# Patient Record
Sex: Female | Born: 1940 | Race: Black or African American | Hispanic: No | Marital: Single | Smoking: Current every day smoker
Health system: Southern US, Community
[De-identification: ages and names within clinical notes are randomized; demographics above are authoritative.]

## PROBLEM LIST (undated history)

## (undated) DIAGNOSIS — R011 Cardiac murmur, unspecified: Secondary | ICD-10-CM

## (undated) DIAGNOSIS — F419 Anxiety disorder, unspecified: Secondary | ICD-10-CM

## (undated) DIAGNOSIS — IMO0002 Reserved for concepts with insufficient information to code with codable children: Secondary | ICD-10-CM

## (undated) DIAGNOSIS — N134 Hydroureter: Secondary | ICD-10-CM

## (undated) DIAGNOSIS — I679 Cerebrovascular disease, unspecified: Secondary | ICD-10-CM

## (undated) DIAGNOSIS — J189 Pneumonia, unspecified organism: Secondary | ICD-10-CM

## (undated) DIAGNOSIS — Z87442 Personal history of urinary calculi: Secondary | ICD-10-CM

## (undated) DIAGNOSIS — S21339A Puncture wound without foreign body of unspecified front wall of thorax with penetration into thoracic cavity, initial encounter: Secondary | ICD-10-CM

## (undated) DIAGNOSIS — H269 Unspecified cataract: Secondary | ICD-10-CM

## (undated) DIAGNOSIS — D373 Neoplasm of uncertain behavior of appendix: Secondary | ICD-10-CM

## (undated) DIAGNOSIS — R911 Solitary pulmonary nodule: Secondary | ICD-10-CM

## (undated) DIAGNOSIS — D649 Anemia, unspecified: Secondary | ICD-10-CM

## (undated) DIAGNOSIS — M25562 Pain in left knee: Secondary | ICD-10-CM

## (undated) DIAGNOSIS — K2270 Barrett's esophagus without dysplasia: Secondary | ICD-10-CM

## (undated) DIAGNOSIS — M545 Low back pain, unspecified: Secondary | ICD-10-CM

## (undated) DIAGNOSIS — R110 Nausea: Secondary | ICD-10-CM

## (undated) DIAGNOSIS — K219 Gastro-esophageal reflux disease without esophagitis: Secondary | ICD-10-CM

## (undated) DIAGNOSIS — C7A8 Other malignant neuroendocrine tumors: Secondary | ICD-10-CM

## (undated) DIAGNOSIS — M25561 Pain in right knee: Secondary | ICD-10-CM

## (undated) DIAGNOSIS — F1010 Alcohol abuse, uncomplicated: Secondary | ICD-10-CM

## (undated) DIAGNOSIS — R140 Abdominal distension (gaseous): Secondary | ICD-10-CM

## (undated) DIAGNOSIS — F32A Depression, unspecified: Secondary | ICD-10-CM

## (undated) DIAGNOSIS — K589 Irritable bowel syndrome without diarrhea: Secondary | ICD-10-CM

## (undated) DIAGNOSIS — K311 Adult hypertrophic pyloric stenosis: Secondary | ICD-10-CM

## (undated) DIAGNOSIS — K838 Other specified diseases of biliary tract: Secondary | ICD-10-CM

## (undated) DIAGNOSIS — I10 Essential (primary) hypertension: Secondary | ICD-10-CM

## (undated) DIAGNOSIS — M1990 Unspecified osteoarthritis, unspecified site: Secondary | ICD-10-CM

## (undated) DIAGNOSIS — W3400XA Accidental discharge from unspecified firearms or gun, initial encounter: Secondary | ICD-10-CM

## (undated) DIAGNOSIS — E538 Deficiency of other specified B group vitamins: Secondary | ICD-10-CM

## (undated) DIAGNOSIS — Z8719 Personal history of other diseases of the digestive system: Secondary | ICD-10-CM

## (undated) DIAGNOSIS — N1330 Unspecified hydronephrosis: Secondary | ICD-10-CM

## (undated) DIAGNOSIS — G44209 Tension-type headache, unspecified, not intractable: Secondary | ICD-10-CM

## (undated) DIAGNOSIS — G250 Essential tremor: Secondary | ICD-10-CM

## (undated) DIAGNOSIS — H409 Unspecified glaucoma: Secondary | ICD-10-CM

## (undated) DIAGNOSIS — R633 Feeding difficulties: Secondary | ICD-10-CM

## (undated) DIAGNOSIS — E860 Dehydration: Secondary | ICD-10-CM

## (undated) DIAGNOSIS — F329 Major depressive disorder, single episode, unspecified: Secondary | ICD-10-CM

## (undated) DIAGNOSIS — Z8679 Personal history of other diseases of the circulatory system: Secondary | ICD-10-CM

## (undated) DIAGNOSIS — R6339 Other feeding difficulties: Secondary | ICD-10-CM

## (undated) DIAGNOSIS — K859 Acute pancreatitis without necrosis or infection, unspecified: Secondary | ICD-10-CM

## (undated) DIAGNOSIS — R319 Hematuria, unspecified: Secondary | ICD-10-CM

## (undated) DIAGNOSIS — I82409 Acute embolism and thrombosis of unspecified deep veins of unspecified lower extremity: Secondary | ICD-10-CM

## (undated) DIAGNOSIS — E872 Acidosis: Secondary | ICD-10-CM

## (undated) DIAGNOSIS — D689 Coagulation defect, unspecified: Secondary | ICD-10-CM

## (undated) DIAGNOSIS — I639 Cerebral infarction, unspecified: Secondary | ICD-10-CM

## (undated) DIAGNOSIS — G4730 Sleep apnea, unspecified: Secondary | ICD-10-CM

## (undated) DIAGNOSIS — I4510 Unspecified right bundle-branch block: Secondary | ICD-10-CM

## (undated) DIAGNOSIS — K5790 Diverticulosis of intestine, part unspecified, without perforation or abscess without bleeding: Secondary | ICD-10-CM

## (undated) DIAGNOSIS — T7840XA Allergy, unspecified, initial encounter: Secondary | ICD-10-CM

## (undated) DIAGNOSIS — K3184 Gastroparesis: Secondary | ICD-10-CM

## (undated) DIAGNOSIS — K648 Other hemorrhoids: Secondary | ICD-10-CM

## (undated) DIAGNOSIS — I700 Atherosclerosis of aorta: Secondary | ICD-10-CM

## (undated) DIAGNOSIS — E8729 Other acidosis: Secondary | ICD-10-CM

## (undated) HISTORY — DX: Anemia, unspecified: D64.9

## (undated) HISTORY — DX: Solitary pulmonary nodule: R91.1

## (undated) HISTORY — PX: ESOPHAGOGASTRODUODENOSCOPY: SHX1529

## (undated) HISTORY — PX: OTHER SURGICAL HISTORY: SHX169

## (undated) HISTORY — DX: Coagulation defect, unspecified: D68.9

## (undated) HISTORY — PX: APPENDECTOMY: SHX54

## (undated) HISTORY — DX: Diverticulosis of intestine, part unspecified, without perforation or abscess without bleeding: K57.90

## (undated) HISTORY — DX: Reserved for concepts with insufficient information to code with codable children: IMO0002

## (undated) HISTORY — DX: Other specified diseases of biliary tract: K83.8

## (undated) HISTORY — DX: Other feeding difficulties: R63.39

## (undated) HISTORY — DX: Essential tremor: G25.0

## (undated) HISTORY — DX: Major depressive disorder, single episode, unspecified: F32.9

## (undated) HISTORY — DX: Cerebrovascular disease, unspecified: I67.9

## (undated) HISTORY — PX: HERNIA REPAIR: SHX51

## (undated) HISTORY — DX: Gastroparesis: K31.84

## (undated) HISTORY — DX: Sleep apnea, unspecified: G47.30

## (undated) HISTORY — DX: Adult hypertrophic pyloric stenosis: K31.1

## (undated) HISTORY — DX: Gastro-esophageal reflux disease without esophagitis: K21.9

## (undated) HISTORY — DX: Feeding difficulties: R63.3

## (undated) HISTORY — PX: COLONOSCOPY: SHX174

## (undated) HISTORY — DX: Acute embolism and thrombosis of unspecified deep veins of unspecified lower extremity: I82.409

## (undated) HISTORY — PX: THORACOTOMY: SUR1349

## (undated) HISTORY — DX: Unspecified glaucoma: H40.9

## (undated) HISTORY — DX: Barrett's esophagus without dysplasia: K22.70

## (undated) HISTORY — DX: Irritable bowel syndrome, unspecified: K58.9

## (undated) HISTORY — DX: Unspecified osteoarthritis, unspecified site: M19.90

## (undated) HISTORY — DX: Unspecified cataract: H26.9

## (undated) HISTORY — DX: Neoplasm of uncertain behavior of appendix: D37.3

## (undated) HISTORY — DX: Puncture wound without foreign body of unspecified front wall of thorax with penetration into thoracic cavity, initial encounter: S21.339A

## (undated) HISTORY — DX: Accidental discharge from unspecified firearms or gun, initial encounter: W34.00XA

## (undated) HISTORY — DX: Acute pancreatitis without necrosis or infection, unspecified: K85.90

## (undated) HISTORY — DX: Depression, unspecified: F32.A

## (undated) HISTORY — DX: Essential (primary) hypertension: I10

## (undated) HISTORY — DX: Other hemorrhoids: K64.8

## (undated) HISTORY — DX: Allergy, unspecified, initial encounter: T78.40XA

---

## 1997-08-21 ENCOUNTER — Encounter: Admission: RE | Admit: 1997-08-21 | Discharge: 1997-08-21 | Payer: Self-pay | Admitting: Family Medicine

## 1997-08-30 ENCOUNTER — Encounter: Admission: RE | Admit: 1997-08-30 | Discharge: 1997-08-30 | Payer: Self-pay | Admitting: Sports Medicine

## 1997-10-11 ENCOUNTER — Encounter: Admission: RE | Admit: 1997-10-11 | Discharge: 1997-10-11 | Payer: Self-pay | Admitting: Family Medicine

## 1997-10-18 ENCOUNTER — Encounter: Admission: RE | Admit: 1997-10-18 | Discharge: 1997-10-18 | Payer: Self-pay | Admitting: Sports Medicine

## 1997-11-12 ENCOUNTER — Encounter: Admission: RE | Admit: 1997-11-12 | Discharge: 1997-11-12 | Payer: Self-pay | Admitting: Family Medicine

## 1998-02-08 ENCOUNTER — Encounter: Admission: RE | Admit: 1998-02-08 | Discharge: 1998-02-08 | Payer: Self-pay | Admitting: Family Medicine

## 1998-05-31 ENCOUNTER — Encounter: Admission: RE | Admit: 1998-05-31 | Discharge: 1998-05-31 | Payer: Self-pay | Admitting: Family Medicine

## 1998-06-14 ENCOUNTER — Encounter: Admission: RE | Admit: 1998-06-14 | Discharge: 1998-06-14 | Payer: Self-pay | Admitting: Family Medicine

## 1998-07-17 ENCOUNTER — Encounter: Admission: RE | Admit: 1998-07-17 | Discharge: 1998-07-17 | Payer: Self-pay | Admitting: Family Medicine

## 1998-08-19 ENCOUNTER — Encounter: Admission: RE | Admit: 1998-08-19 | Discharge: 1998-08-19 | Payer: Self-pay | Admitting: Sports Medicine

## 1998-08-20 ENCOUNTER — Encounter: Admission: RE | Admit: 1998-08-20 | Discharge: 1998-08-20 | Payer: Self-pay | Admitting: Family Medicine

## 1998-08-21 ENCOUNTER — Encounter: Admission: RE | Admit: 1998-08-21 | Discharge: 1998-08-21 | Payer: Self-pay | Admitting: Family Medicine

## 1998-12-31 ENCOUNTER — Encounter: Admission: RE | Admit: 1998-12-31 | Discharge: 1998-12-31 | Payer: Self-pay | Admitting: Sports Medicine

## 1999-01-15 ENCOUNTER — Encounter: Admission: RE | Admit: 1999-01-15 | Discharge: 1999-01-15 | Payer: Self-pay | Admitting: Family Medicine

## 1999-01-21 ENCOUNTER — Encounter: Admission: RE | Admit: 1999-01-21 | Discharge: 1999-01-21 | Payer: Self-pay | Admitting: Sports Medicine

## 1999-02-19 ENCOUNTER — Encounter: Admission: RE | Admit: 1999-02-19 | Discharge: 1999-02-19 | Payer: Self-pay | Admitting: Family Medicine

## 1999-02-28 ENCOUNTER — Encounter: Admission: RE | Admit: 1999-02-28 | Discharge: 1999-02-28 | Payer: Self-pay | Admitting: Sports Medicine

## 1999-03-19 ENCOUNTER — Encounter: Admission: RE | Admit: 1999-03-19 | Discharge: 1999-03-19 | Payer: Self-pay | Admitting: Family Medicine

## 2000-03-07 ENCOUNTER — Ambulatory Visit (HOSPITAL_COMMUNITY): Admission: RE | Admit: 2000-03-07 | Discharge: 2000-03-07 | Payer: Self-pay

## 2001-03-11 ENCOUNTER — Encounter: Payer: Self-pay | Admitting: Family Medicine

## 2001-03-11 ENCOUNTER — Encounter: Admission: RE | Admit: 2001-03-11 | Discharge: 2001-03-11 | Payer: Self-pay | Admitting: Family Medicine

## 2001-08-31 ENCOUNTER — Encounter: Payer: Self-pay | Admitting: Internal Medicine

## 2001-08-31 ENCOUNTER — Ambulatory Visit (HOSPITAL_COMMUNITY): Admission: RE | Admit: 2001-08-31 | Discharge: 2001-08-31 | Payer: Self-pay | Admitting: Internal Medicine

## 2002-08-20 ENCOUNTER — Emergency Department (HOSPITAL_COMMUNITY): Admission: AD | Admit: 2002-08-20 | Discharge: 2002-08-20 | Payer: Self-pay | Admitting: Emergency Medicine

## 2002-08-20 ENCOUNTER — Encounter: Payer: Self-pay | Admitting: Emergency Medicine

## 2003-04-04 ENCOUNTER — Emergency Department (HOSPITAL_COMMUNITY): Admission: EM | Admit: 2003-04-04 | Discharge: 2003-04-05 | Payer: Self-pay | Admitting: Emergency Medicine

## 2003-04-25 ENCOUNTER — Ambulatory Visit (HOSPITAL_COMMUNITY): Admission: RE | Admit: 2003-04-25 | Discharge: 2003-04-25 | Payer: Self-pay | Admitting: Internal Medicine

## 2004-01-21 ENCOUNTER — Ambulatory Visit: Payer: Self-pay | Admitting: *Deleted

## 2004-01-22 ENCOUNTER — Ambulatory Visit: Payer: Self-pay | Admitting: Nurse Practitioner

## 2004-01-29 ENCOUNTER — Ambulatory Visit: Payer: Self-pay | Admitting: *Deleted

## 2004-02-11 ENCOUNTER — Ambulatory Visit: Payer: Self-pay | Admitting: Nurse Practitioner

## 2004-03-03 ENCOUNTER — Ambulatory Visit: Payer: Self-pay | Admitting: Nurse Practitioner

## 2004-04-07 ENCOUNTER — Ambulatory Visit: Payer: Self-pay | Admitting: Nurse Practitioner

## 2004-04-23 ENCOUNTER — Ambulatory Visit: Payer: Self-pay | Admitting: Nurse Practitioner

## 2004-06-09 ENCOUNTER — Ambulatory Visit: Payer: Self-pay | Admitting: Nurse Practitioner

## 2004-06-30 ENCOUNTER — Ambulatory Visit: Payer: Self-pay | Admitting: Nurse Practitioner

## 2004-07-03 ENCOUNTER — Ambulatory Visit (HOSPITAL_COMMUNITY): Admission: RE | Admit: 2004-07-03 | Discharge: 2004-07-03 | Payer: Self-pay | Admitting: Internal Medicine

## 2004-07-15 ENCOUNTER — Ambulatory Visit (HOSPITAL_COMMUNITY): Admission: RE | Admit: 2004-07-15 | Discharge: 2004-07-15 | Payer: Self-pay | Admitting: Internal Medicine

## 2004-07-16 ENCOUNTER — Ambulatory Visit (HOSPITAL_COMMUNITY): Admission: RE | Admit: 2004-07-16 | Discharge: 2004-07-16 | Payer: Self-pay | Admitting: Internal Medicine

## 2004-07-16 ENCOUNTER — Ambulatory Visit: Payer: Self-pay | Admitting: Cardiology

## 2004-07-16 ENCOUNTER — Encounter: Payer: Self-pay | Admitting: Cardiology

## 2004-07-30 ENCOUNTER — Ambulatory Visit (HOSPITAL_COMMUNITY): Admission: RE | Admit: 2004-07-30 | Discharge: 2004-07-30 | Payer: Self-pay | Admitting: Cardiology

## 2004-08-02 HISTORY — PX: CARDIAC CATHETERIZATION: SHX172

## 2004-08-13 ENCOUNTER — Ambulatory Visit (HOSPITAL_COMMUNITY): Admission: RE | Admit: 2004-08-13 | Discharge: 2004-08-13 | Payer: Self-pay | Admitting: Cardiology

## 2004-09-04 ENCOUNTER — Ambulatory Visit: Payer: Self-pay | Admitting: Nurse Practitioner

## 2004-09-08 ENCOUNTER — Ambulatory Visit: Payer: Self-pay | Admitting: Nurse Practitioner

## 2004-09-10 ENCOUNTER — Ambulatory Visit (HOSPITAL_COMMUNITY): Admission: RE | Admit: 2004-09-10 | Discharge: 2004-09-10 | Payer: Self-pay | Admitting: Internal Medicine

## 2004-09-14 IMAGING — RF DG UGI W/ HIGH DENSITY W/KUB
15 of 18 series · 15 of 18 positions shown · non-contrast
Comparison: none

CLINICAL DATA: Nausea, vomiting.  Prior Nissen fundoplication.  
 UPPER GI, KUB
 KUB
 A preliminary film of the abdomen shows a nonspecific bowel gas pattern.  Again there is a question of a left upper pole renal calculus.  Moderate amount of feces is noted throughout the colon. 
 UPPER GI
 The esophagus distends normally.  There is narrowing at the GE junction secondary to the Nissen fundoplication.  However, in the LPO position there is mild gastroesophageal reflux noted.  The stomach is normal in contour and peristalsis.  The duodenal bulb fills well with no ulceration.  
 IMPRESSION
 There is gastroesophageal reflux noted.  No active ulceration is seen. 
 [REDACTED]

[Series 1: run · 1 of 1 slices shown (1 of 15)]
[im 1/1]
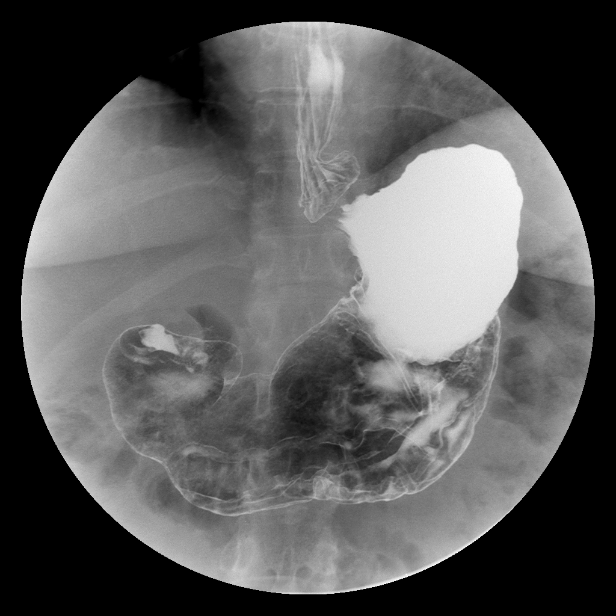

[Series 2: run · 1 of 1 slices shown (2 of 15)]
[im 1/1]
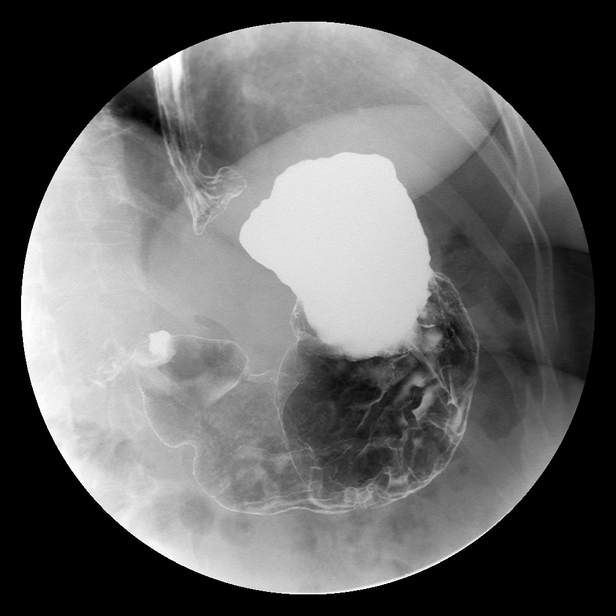

[Series 4: run · 1 of 1 slices shown (3 of 15)]
[im 1/1]
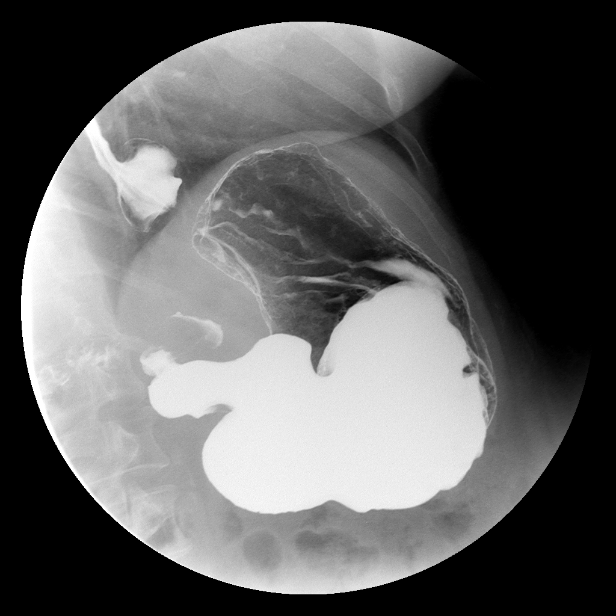

[Series 7: run · 1 of 1 slices shown (4 of 15)]
[im 1/1]
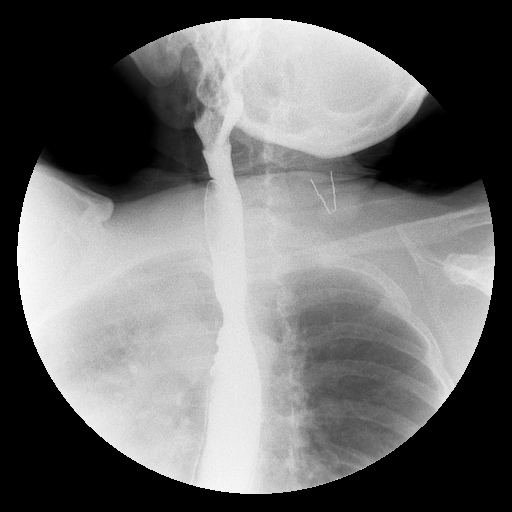

[Series 8: run · 1 of 1 slices shown (5 of 15)]
[im 1/1]
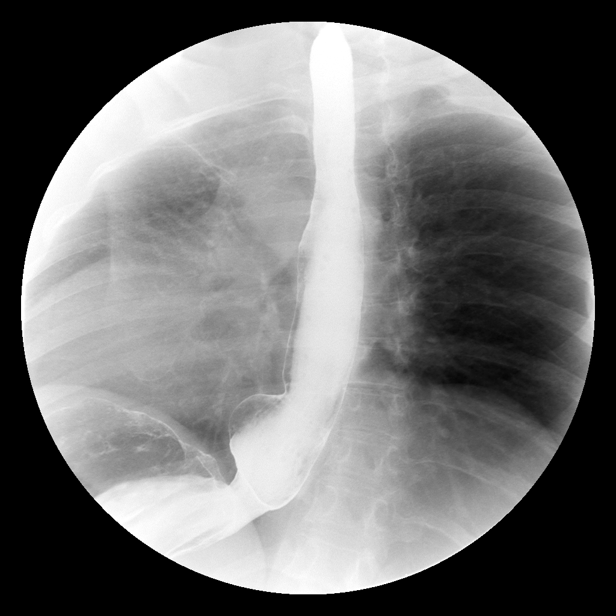

[Series 9: run · 1 of 1 slices shown (6 of 15)]
[im 1/1]
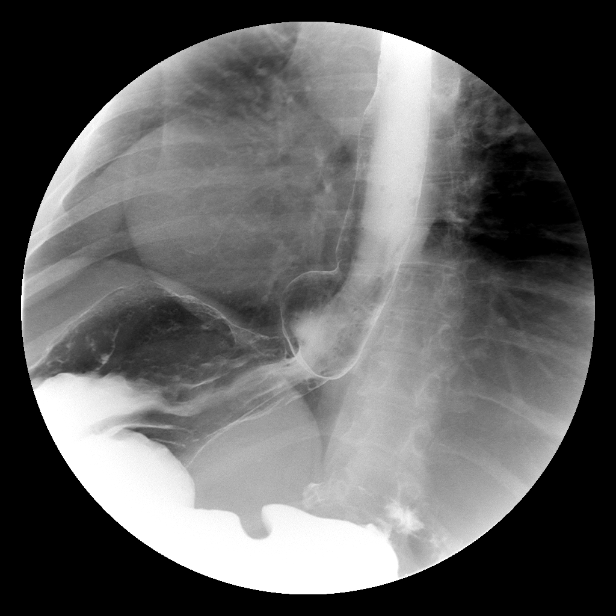

[Series 12: run · 1 of 1 slices shown (7 of 15)]
[im 1/1]
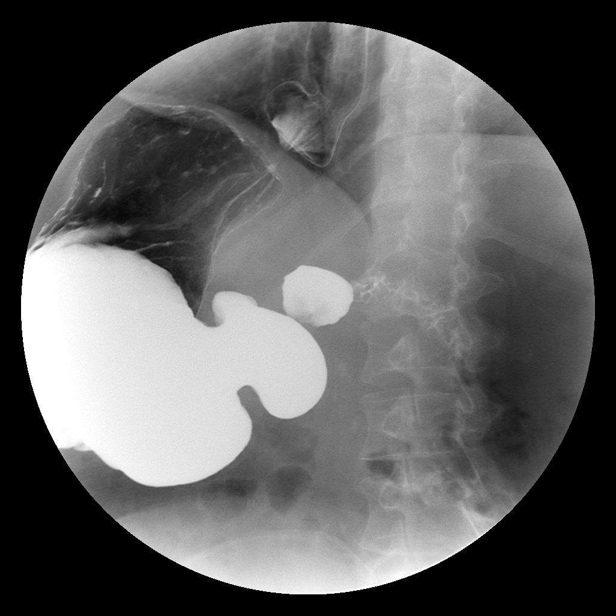

[Series 14: run · 1 of 1 slices shown (8 of 15)]
[im 1/1]
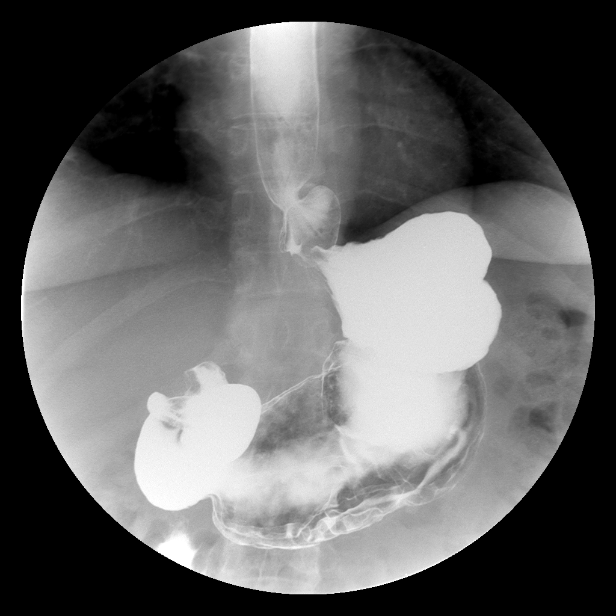

[Series 16: run · 1 of 1 slices shown (9 of 15)]
[im 1/1]
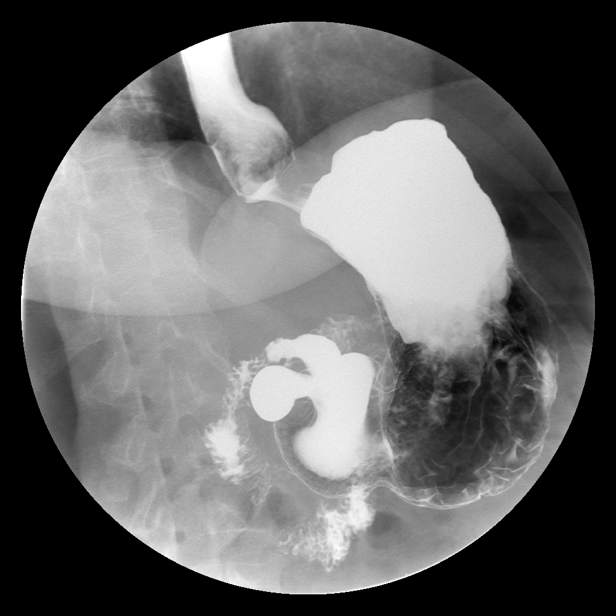

[Series 17: run · 1 of 1 slices shown (10 of 15)]
[im 1/1]
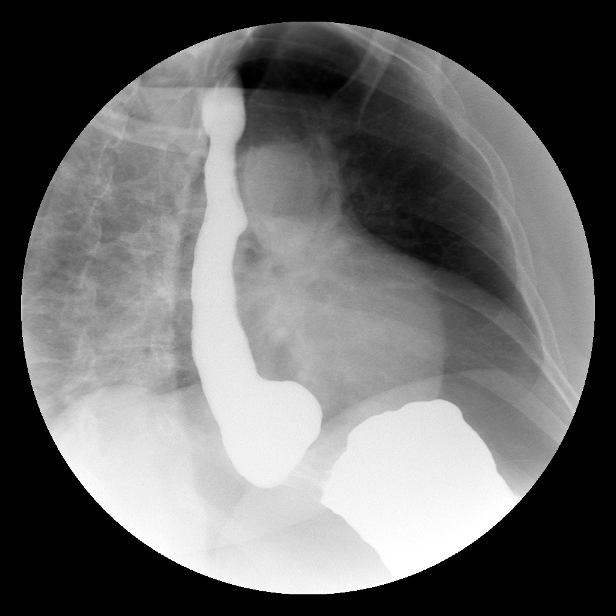

[Series 20: run · 1 of 1 slices shown (11 of 15)]
[im 1/1]
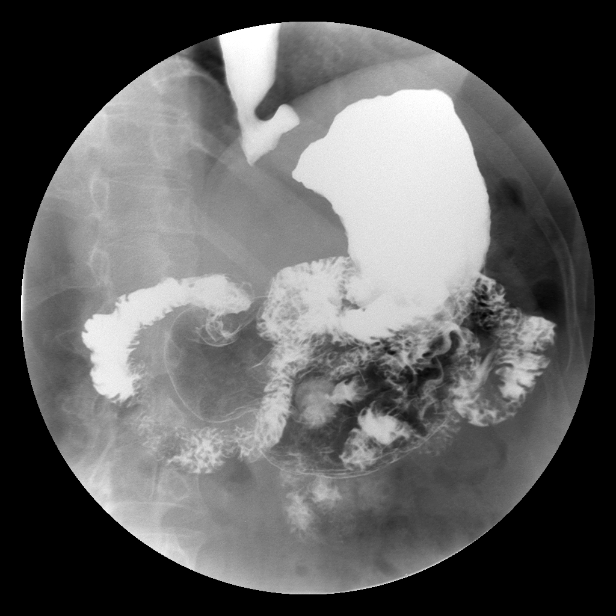

[Series 21: run · 1 of 1 slices shown (12 of 15)]
[im 1/1]
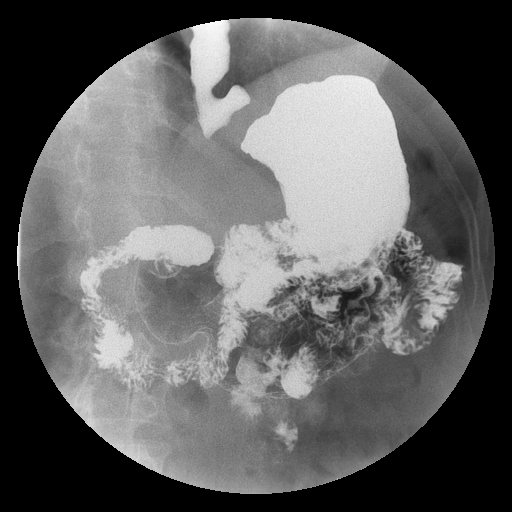

[Series 24: run · 1 of 1 slices shown (13 of 15)]
[im 1/1]
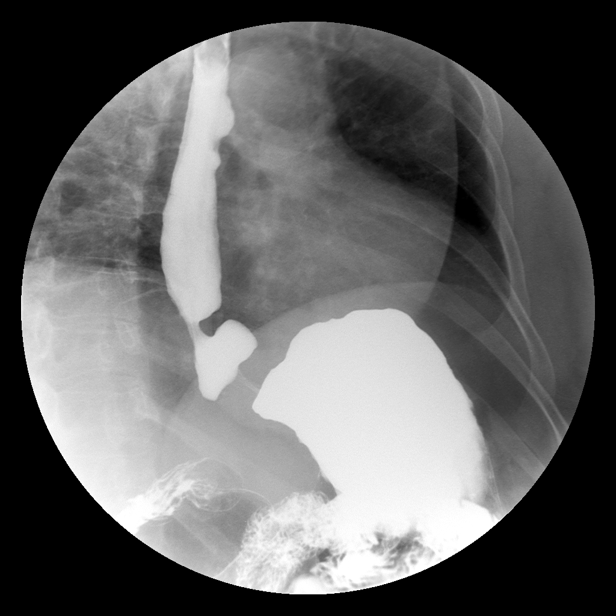

[Series 25: run · 1 of 1 slices shown (14 of 15)]
[im 1/1]
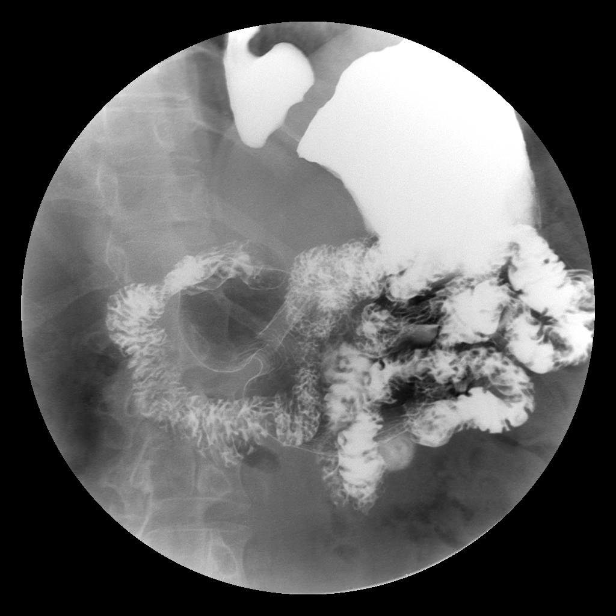

[Series 26: run · 1 of 1 slices shown (15 of 15)]
[im 1/1]
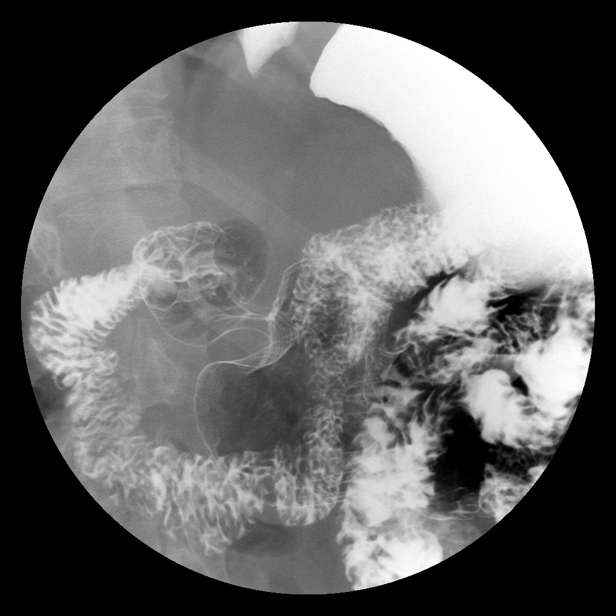

[15 of 18 positions shown; findings below may reference images not displayed]

## 2004-10-08 ENCOUNTER — Ambulatory Visit (HOSPITAL_COMMUNITY): Admission: RE | Admit: 2004-10-08 | Discharge: 2004-10-08 | Payer: Self-pay | Admitting: Internal Medicine

## 2004-10-22 ENCOUNTER — Ambulatory Visit: Payer: Self-pay | Admitting: Nurse Practitioner

## 2004-11-06 ENCOUNTER — Encounter: Admission: RE | Admit: 2004-11-06 | Discharge: 2004-11-06 | Payer: Self-pay | Admitting: Internal Medicine

## 2004-11-07 ENCOUNTER — Ambulatory Visit: Payer: Self-pay | Admitting: Nurse Practitioner

## 2005-02-09 ENCOUNTER — Ambulatory Visit: Payer: Self-pay | Admitting: Nurse Practitioner

## 2005-03-31 ENCOUNTER — Ambulatory Visit: Payer: Self-pay | Admitting: Nurse Practitioner

## 2005-04-02 ENCOUNTER — Ambulatory Visit (HOSPITAL_COMMUNITY): Admission: RE | Admit: 2005-04-02 | Discharge: 2005-04-02 | Payer: Self-pay | Admitting: Internal Medicine

## 2005-04-07 ENCOUNTER — Ambulatory Visit: Payer: Self-pay | Admitting: Nurse Practitioner

## 2005-04-08 ENCOUNTER — Ambulatory Visit (HOSPITAL_COMMUNITY): Admission: RE | Admit: 2005-04-08 | Discharge: 2005-04-08 | Payer: Self-pay | Admitting: Internal Medicine

## 2005-06-24 ENCOUNTER — Ambulatory Visit: Payer: Self-pay | Admitting: Nurse Practitioner

## 2005-07-28 ENCOUNTER — Ambulatory Visit: Payer: Self-pay | Admitting: Family Medicine

## 2005-08-09 ENCOUNTER — Ambulatory Visit: Payer: Self-pay | Admitting: Internal Medicine

## 2005-08-09 ENCOUNTER — Inpatient Hospital Stay (HOSPITAL_COMMUNITY): Admission: EM | Admit: 2005-08-09 | Discharge: 2005-08-10 | Payer: Self-pay | Admitting: Emergency Medicine

## 2005-08-10 ENCOUNTER — Encounter: Payer: Self-pay | Admitting: Internal Medicine

## 2005-08-10 ENCOUNTER — Ambulatory Visit: Payer: Self-pay | Admitting: Internal Medicine

## 2005-08-13 ENCOUNTER — Encounter: Admission: RE | Admit: 2005-08-13 | Discharge: 2005-09-02 | Payer: Self-pay | Admitting: Internal Medicine

## 2005-08-14 ENCOUNTER — Ambulatory Visit: Admission: RE | Admit: 2005-08-14 | Discharge: 2005-08-14 | Payer: Self-pay | Admitting: Critical Care Medicine

## 2005-08-17 ENCOUNTER — Ambulatory Visit: Payer: Self-pay | Admitting: Nurse Practitioner

## 2005-08-24 ENCOUNTER — Ambulatory Visit (HOSPITAL_BASED_OUTPATIENT_CLINIC_OR_DEPARTMENT_OTHER): Admission: RE | Admit: 2005-08-24 | Discharge: 2005-08-24 | Payer: Self-pay | Admitting: Internal Medicine

## 2005-08-30 ENCOUNTER — Ambulatory Visit: Payer: Self-pay | Admitting: Internal Medicine

## 2005-09-09 ENCOUNTER — Ambulatory Visit: Payer: Self-pay | Admitting: Nurse Practitioner

## 2005-09-14 ENCOUNTER — Ambulatory Visit: Payer: Self-pay | Admitting: Internal Medicine

## 2005-09-15 ENCOUNTER — Ambulatory Visit: Payer: Self-pay | Admitting: Internal Medicine

## 2005-09-17 ENCOUNTER — Ambulatory Visit: Payer: Self-pay | Admitting: Internal Medicine

## 2005-09-17 ENCOUNTER — Encounter (INDEPENDENT_AMBULATORY_CARE_PROVIDER_SITE_OTHER): Payer: Self-pay | Admitting: *Deleted

## 2005-09-21 ENCOUNTER — Ambulatory Visit (HOSPITAL_COMMUNITY): Admission: RE | Admit: 2005-09-21 | Discharge: 2005-09-21 | Payer: Self-pay | Admitting: Nurse Practitioner

## 2005-10-22 ENCOUNTER — Ambulatory Visit: Payer: Self-pay | Admitting: Internal Medicine

## 2005-11-23 IMAGING — CT CT PELVIS W/ CM
3 of 5 series · 11 of 36 positions shown, 17 images · IV contrast (GASTRO & 120 ML OMNI)
Comparison: None.

CLINICAL DATA: Persistent shortness of breath.  Chest pressure.  History of   hiatal hernia.  Constipation, diarrhea.
TECHNIQUE: 120 cc Omnipaque 300 was utilized.

[Series 2: chest/abd/pelvis · axial · 0.83mm/px · z∈[-495,-122]mm · 5 of 113 slices shown, 10 images (1 of 2)]
[im 19/113  soft-tissue]
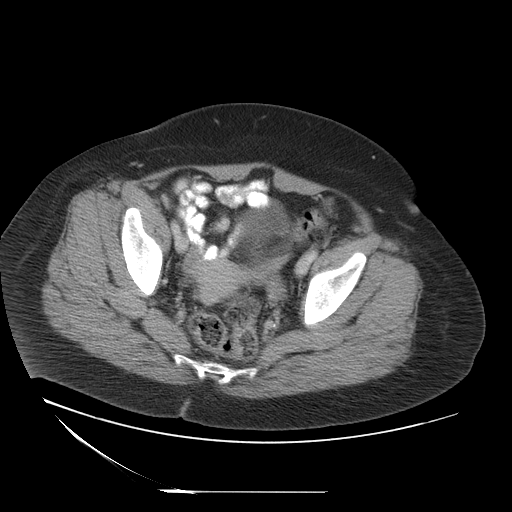
[im 19/113  bone]
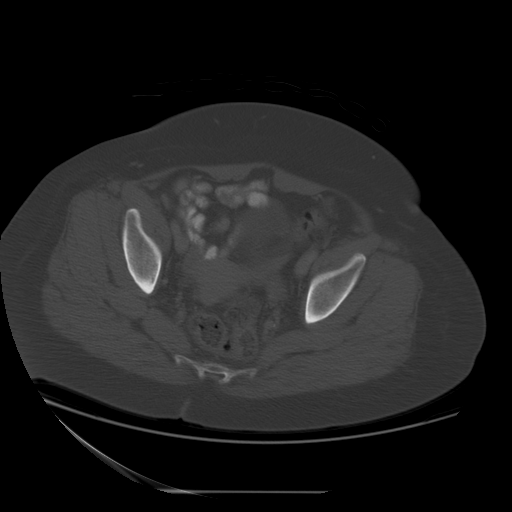
[im 38/113  soft-tissue]
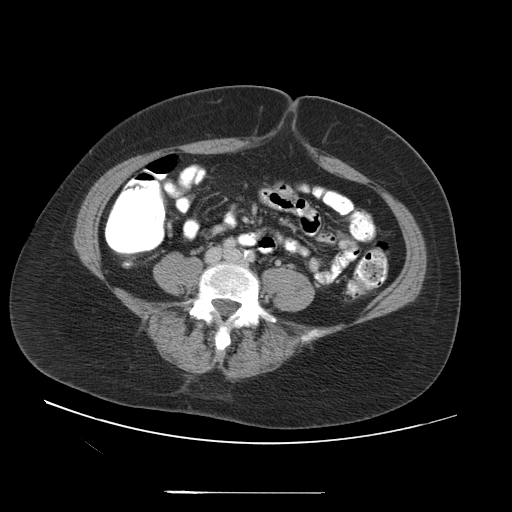
[im 38/113  lung]
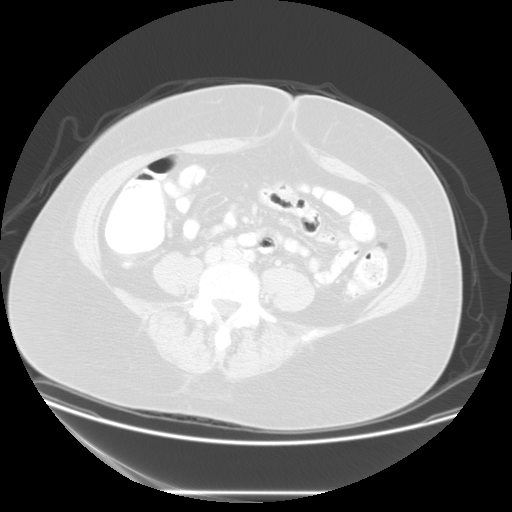
[im 57/113  soft-tissue]
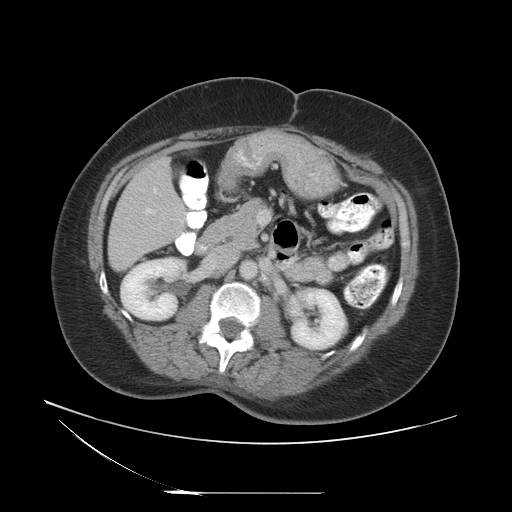
[im 57/113  lung]
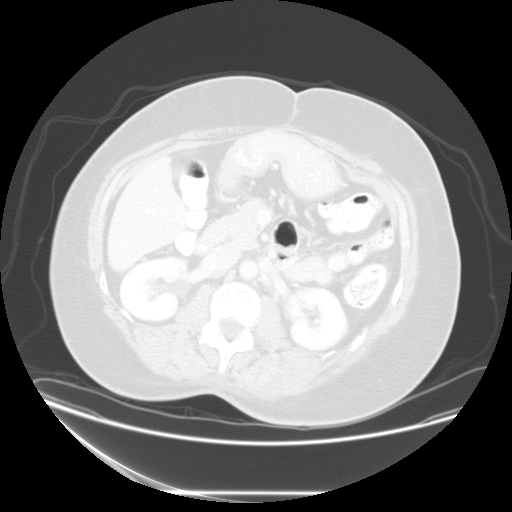
[im 75/113  soft-tissue]
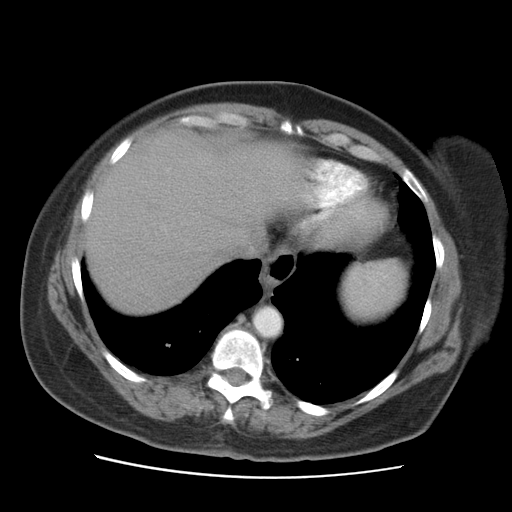
[im 75/113  lung]
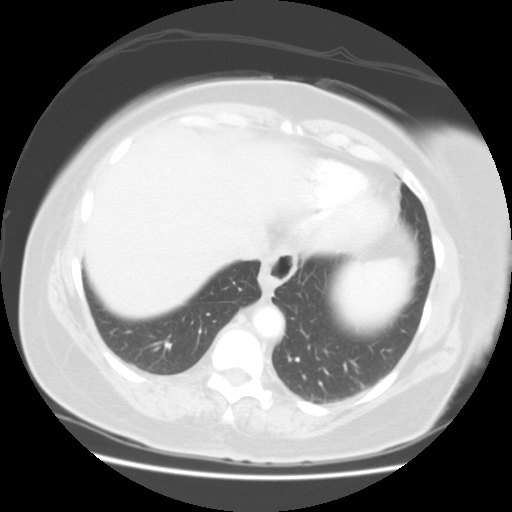
[im 94/113  soft-tissue]
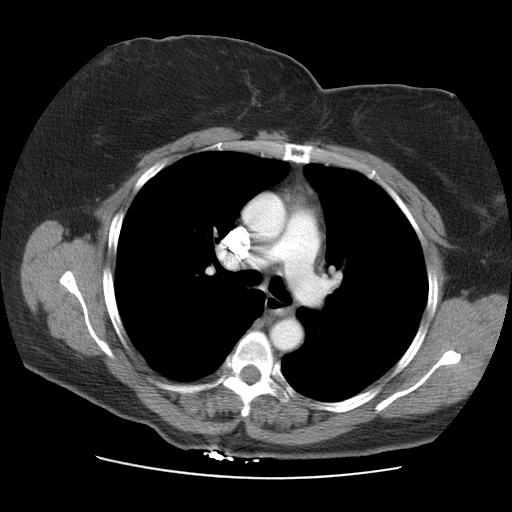
[im 94/113  lung]
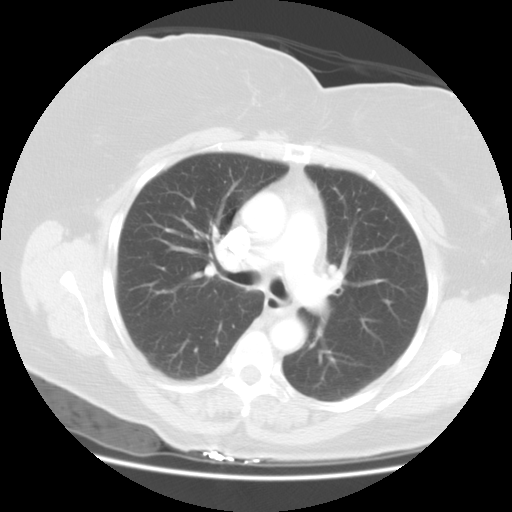

[Series 102: chest/abd/pelvis · axial · 0.70mm/px · z∈[-216,-116]mm · 5 of 338 slices shown (2 of 2)]
[im 36/338  soft-tissue]
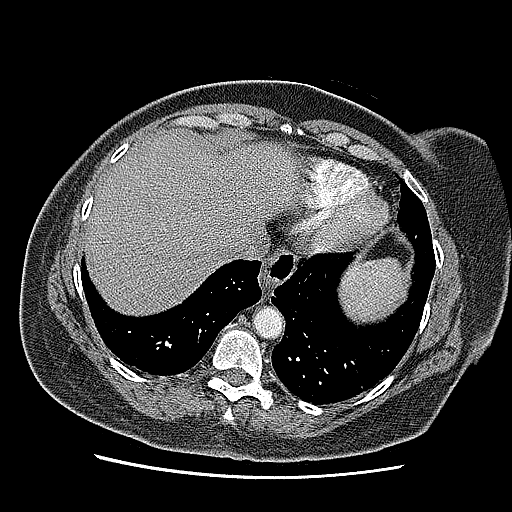
[im 71/338  soft-tissue]
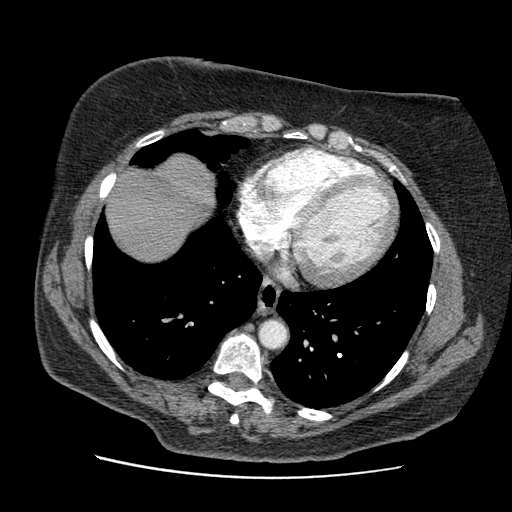
[im 107/338  soft-tissue]
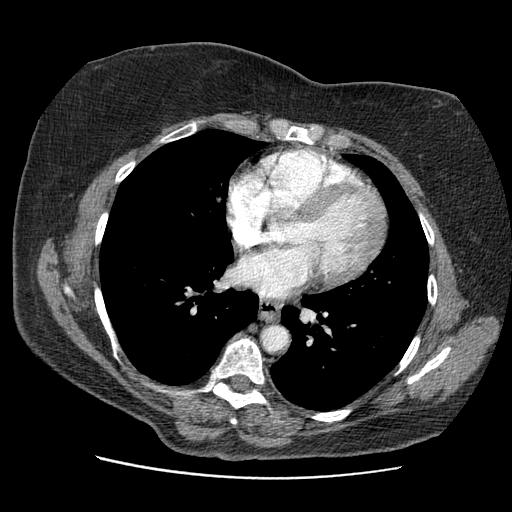
[im 142/338  soft-tissue]
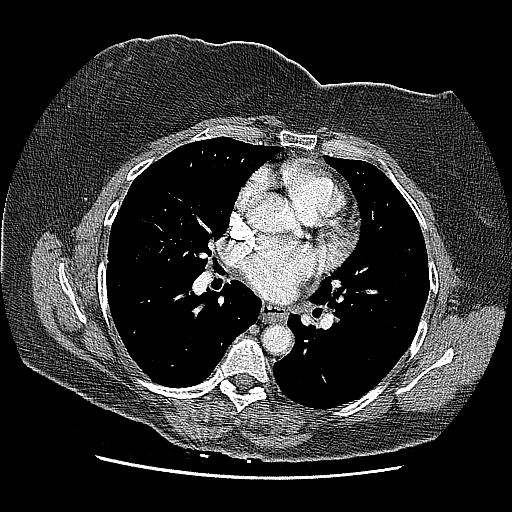
[im 196/338  soft-tissue]
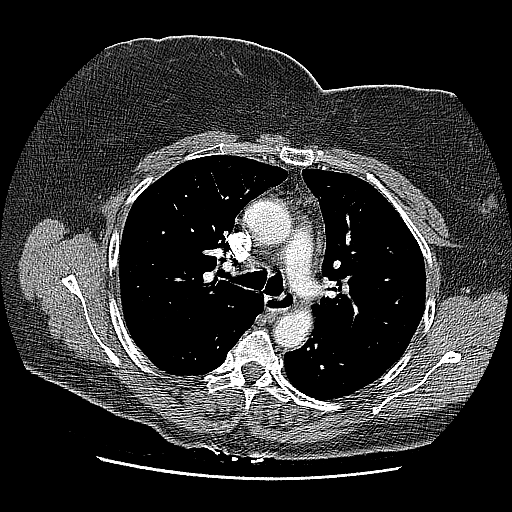

[Series 103: reformatted · sagittal · 0.59mm/px · 1 of 118 slices shown, 2 images]
[im 40/118  soft-tissue]
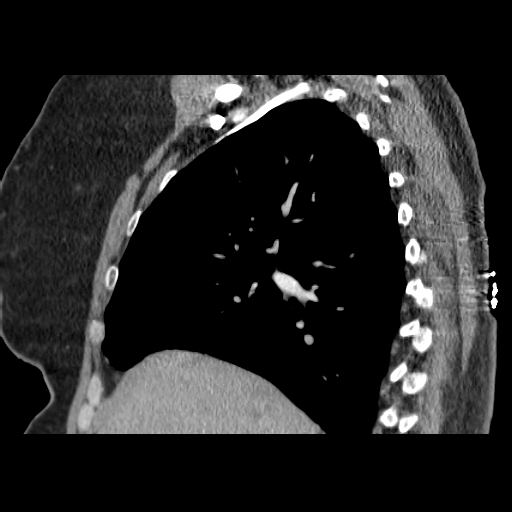
[im 40/118  bone]
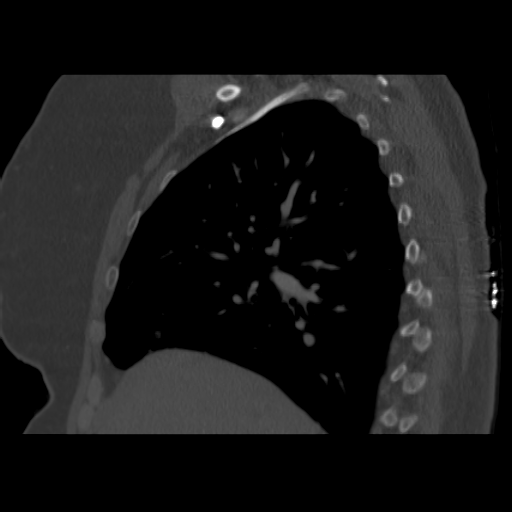

[11 of 36 positions shown; findings below may reference images not displayed]

CT CHEST WITH CONTRAST:
Small hiatal hernia.  No evidence of mediastinal, hilar, or axillary adenopathy.  No evidence of aortic dissection or pulmonary embolus (pulmonary embolus protocol was not utilized).  No pericardial effusion.  Trachea and main stem bronchi are patent.   major fissure and the minor fissure, is a 9.1 x 7.6 mm non-calcified rounded lesion (image #23 of series 3).  Pleural-based hemispherical shaped, 5.8 mm lesion superior segment left lower lobe (image #22 of series 3).  One could attempt PET CT to see if there is any activity in either of these lesions, however, the smaller lesion may be below the level detectable by PET CT imaging and therefore, assessment by FDG activity may not be accurate if this area does not demonstrate FDG uptake, however, if either of these areas did demonstrate FDG uptake to any significant degree, then further investigation with histological correlation may be considered.  Alternatively, one could obtain close follow-up CT imaging with next unenhanced CT scan of the chest in 3 months time recommended.  Curvilinear opacities along the left lung apex and bases have an appearance most suggestive of atelectatic changes or scarring.
IMPRESSION: 1.  Within the right upper lung zone bordering the junction of the minor fissure with major fissure (just superior to the minor fissure), is a 9.1 x 7.6 x 7.8 mm non-calcified nodule.  PET CT imaging may help further delineate this lesion although if this does not demonstrate FDG uptake, then this will require close follow-up CT imaging with next follow-up CT scan in 3 months.  On this follow-up CT scan, a 5.8 mm pleural-based hemispheric lesion within the superior segment of the left lower lobe can also be further delineated.  
2.  Small hiatal hernia.  
3.  Metallic structure within left breast tissue.  
CT ABDOMEN WITH CONTRAST:
Underdistended stomach and slightly limited evaluation of such.  No gross abnormality besides the small hiatal hernia.  Mild atherosclerotic type changes, abdominal aorta without aneurysmal dilatation.  No surrounding adenopathy.  No focal hepatic, splenic, adrenal, or pancreatic lesion.  Within the right kidney, there is a 1.3 cm lesion, which has slightly high Hounsfield units of 25 and cannot be confirmed as a simple cyst.  One could attempt an ultrasound at the present time for further delineation of this lesion.   tiny bilateral renal lesions, which are too small to adequately characterize as cysts, stability can be confirmed on follow up.  No upper abdominal abnormal fluid collection or inflammatory process.  Mild degenerative changes, lumbar spine.  Small periumbilical hernia without bowel entering such.
IMPRESSION: 1.  Renal lesions noted.  These may represent cysts although it is difficult to confirm such on present exam as noted above.  
2.  No evidence of abnormal upper abdominal fluid collection or inflammatory process.  
3.  Mild atherosclerotic type changes, abdominal aorta without aneurysmal dilatation.
CT PELVIS WITH CONTRAST:
Scattered colonic diverticula without CT evidence of diverticulitis.  No inflammation surrounding the appendix.  No abnormal fluid collection.  Degenerative changes, lower lumbar spine.
IMPRESSION: Scattered diverticula without CT evidence of diverticulitis.

## 2005-12-01 ENCOUNTER — Encounter: Admission: RE | Admit: 2005-12-01 | Discharge: 2005-12-01 | Payer: Self-pay | Admitting: Family Medicine

## 2005-12-05 IMAGING — CT NM PET TUM IMG SKULL BASE T - THIGH
4 series · 25 of 25 positions shown · IV contrast ([ID])
Comparison: Previous CT scan on 07/03/04 revealed a 9.1 x 7.8 calcified nodule just lateral to the right hilus. 
 FDG PET-CT TUMOR IMAGING (SKULL BASE TO THIGHS):

 Fasting Blood Glucose:  148.

CLINICAL DATA: Patient has a chest mass.
TECHNIQUE: 17.6 mCi F-18 FDG were administered via the right antecubital fossa.  Full ring PET imaging was performed from the skull base through the mid-thighs 58 minutes after injection.  CT data was obtained and used for attenuation correction and anatomic localization only.  (This was not acquired as a diagnostic CT examination.)

[Series 1: pet ac · axial · 3.3mm · 4.69mm/px · z∈[-870,+0]mm · 7 of 267 slices shown]
[im 1/267]
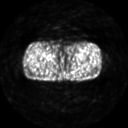
[im 45/267]
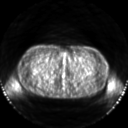
[im 89/267]
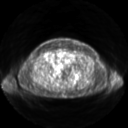
[im 134/267]
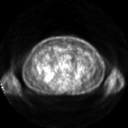
[im 178/267]
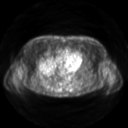
[im 222/267]
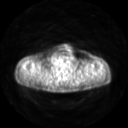
[im 267/267]
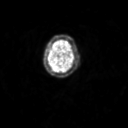

[Series 2: ct images · axial · 3.8mm · 0.98mm/px · z∈[-870,+0]mm · 6 of 267 slices shown]
[im 1/267]
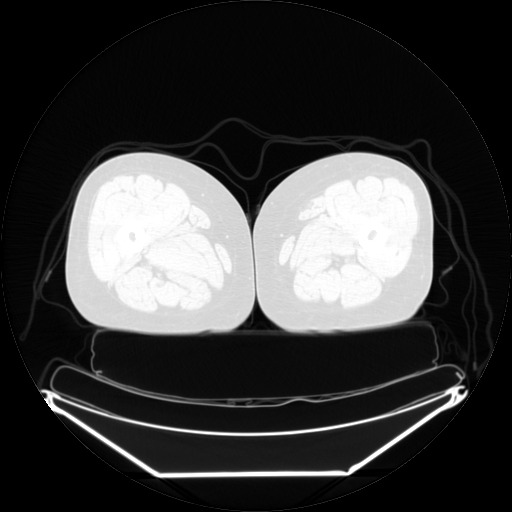
[im 54/267]
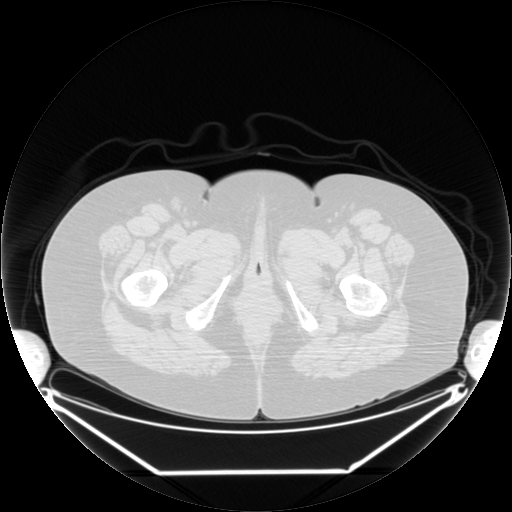
[im 107/267]
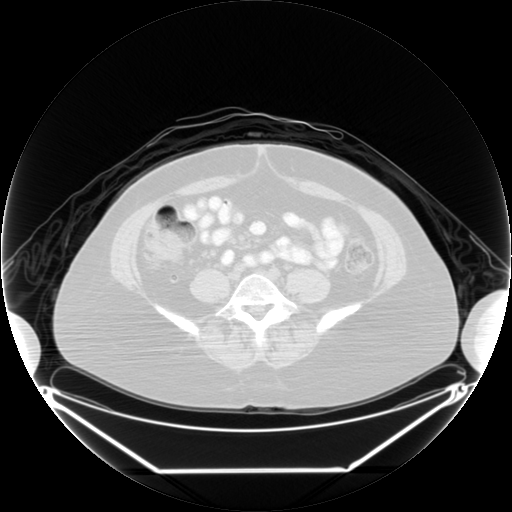
[im 160/267]
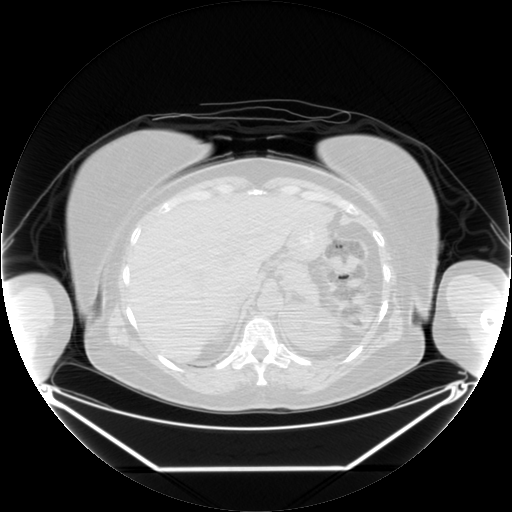
[im 213/267]
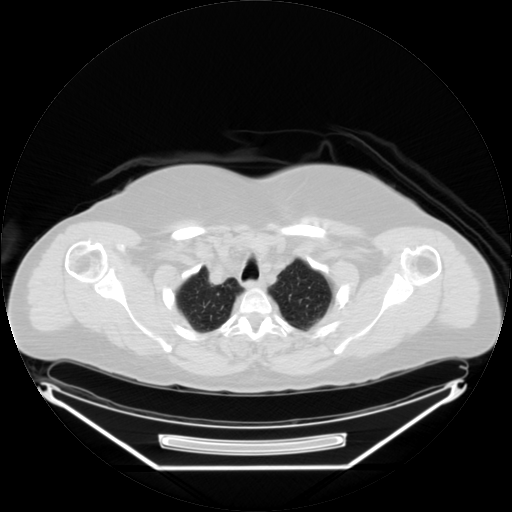
[im 267/267  brain]
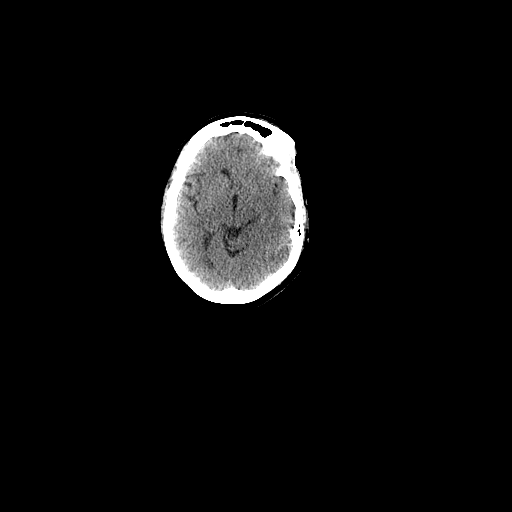

[Series 2: pet nac · axial · 3.3mm · 4.69mm/px · z∈[-870,+0]mm · 6 of 267 slices shown]
[im 1/267]
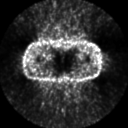
[im 54/267]
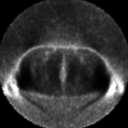
[im 107/267]
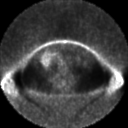
[im 160/267]
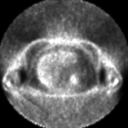
[im 213/267]
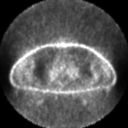
[im 267/267]
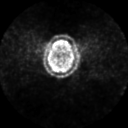

[Series 3: pet ac (id) · axial · 3.3mm · 4.69mm/px · z∈[-870,+0]mm · 6 of 267 slices shown]
[im 1/267]
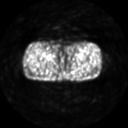
[im 54/267]
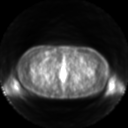
[im 107/267]
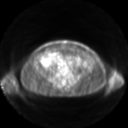
[im 160/267]
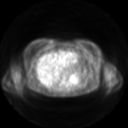
[im 213/267]
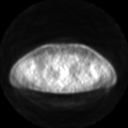
[im 267/267]
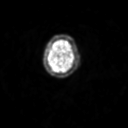

[25 of 25 positions shown; findings below may reference images not displayed]

FINDINGS: No abnormal activity in the neck or chest.  No abnormal activity corresponding to the 9 mm nodule in the right medial lung.  No abnormality of the abdomen or pelvis.
IMPRESSION: PET scan reveals no abnormality.  No abnormal activity corresponding to the 9 mm rounded nodule in the right medial lung.  This would be compatible with a benign nodule.  Followup  CT scan would be suggested however.

## 2006-01-07 ENCOUNTER — Ambulatory Visit: Payer: Self-pay | Admitting: Nurse Practitioner

## 2006-01-31 IMAGING — MR MR LUMBAR SPINE W/O CM
4 of 6 series · 11 of 48 positions shown · non-contrast
Comparison: none

CLINICAL DATA: Patient with persistent low back pain. Previous history of questionable trauma.  No history of previous back surgery. 
MRI LUMBAR SACRAL SPINE:
TECHNIQUE: Multiecho, multiplanar images of the lumbar sacral spine were obtained.

[Series 3: T1 · sagittal · 4.0mm · 0.47mm/px · 3 of 12 slices shown]
[im 3/12]
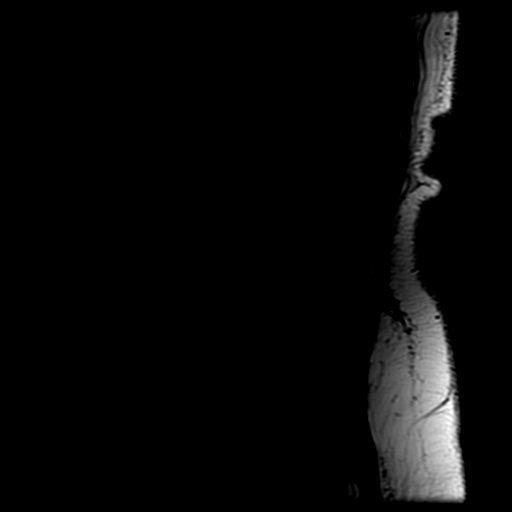
[im 7/12]
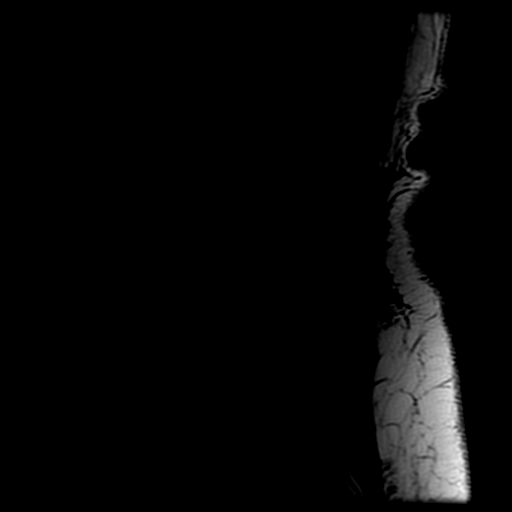
[im 12/12]
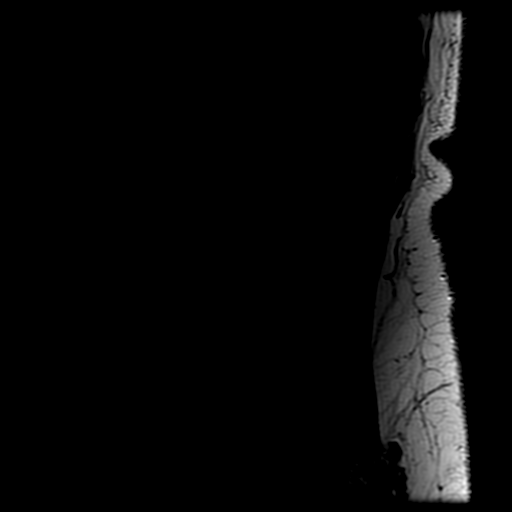

[Series 4: T2 · sagittal · 4.0mm · 0.47mm/px · 3 of 12 slices shown (1 of 2)]
[im 3/12]
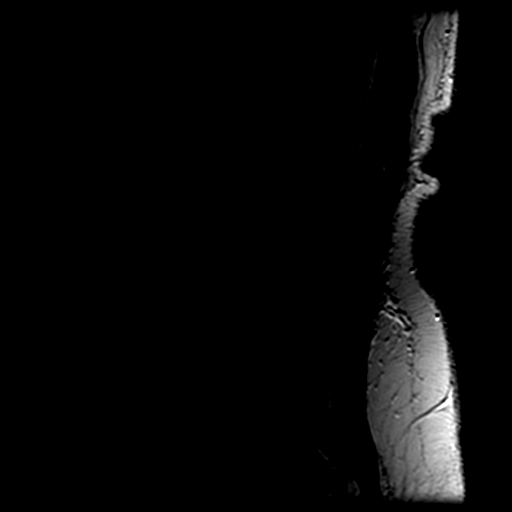
[im 7/12]
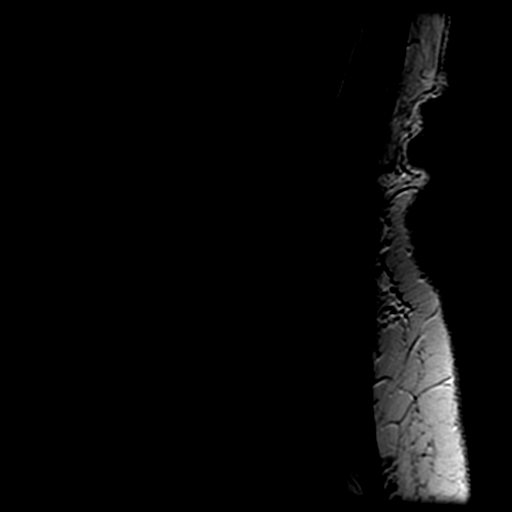
[im 12/12]
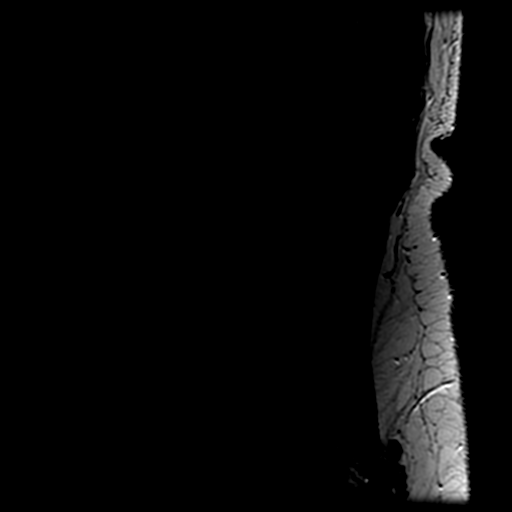

[Series 5: STIR · sagittal · 4.0mm · 0.23mm/px · 2 of 12 slices shown]
[im 3/12]
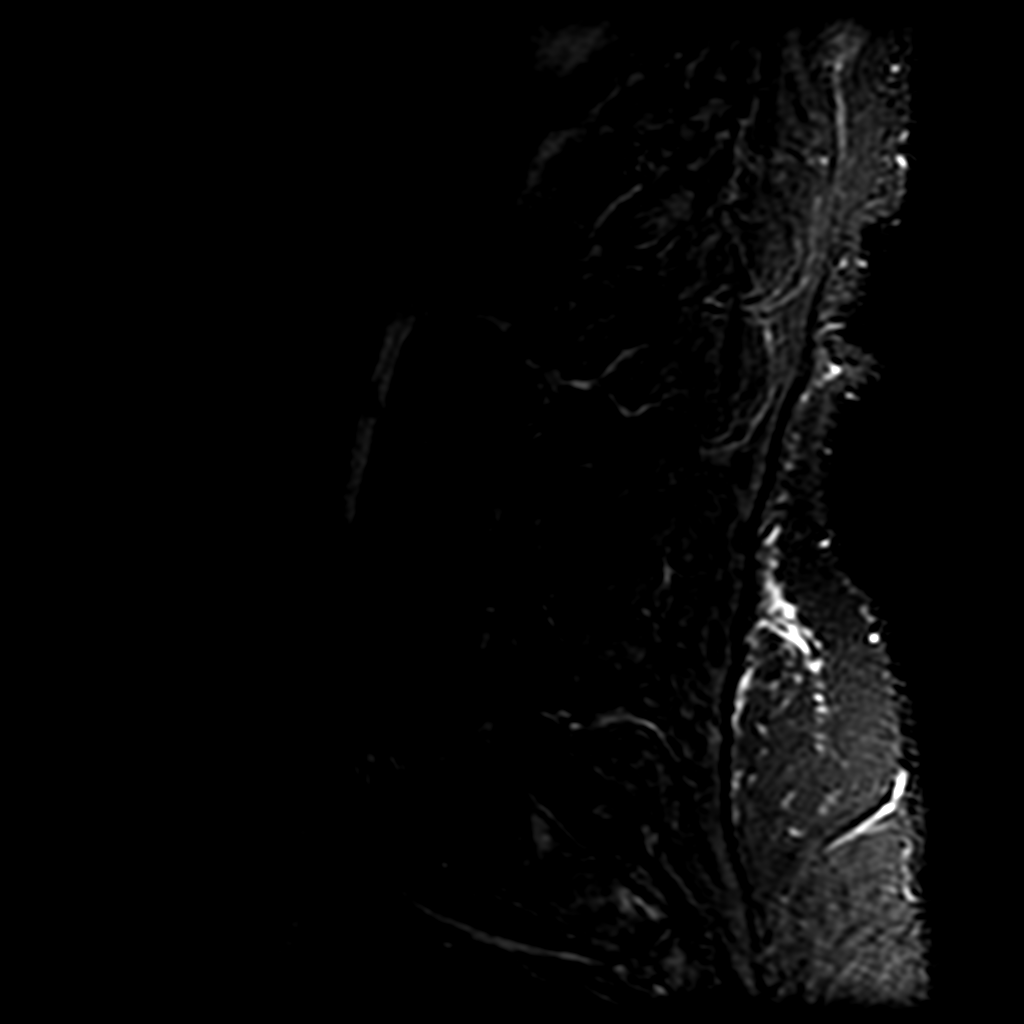
[im 7/12]
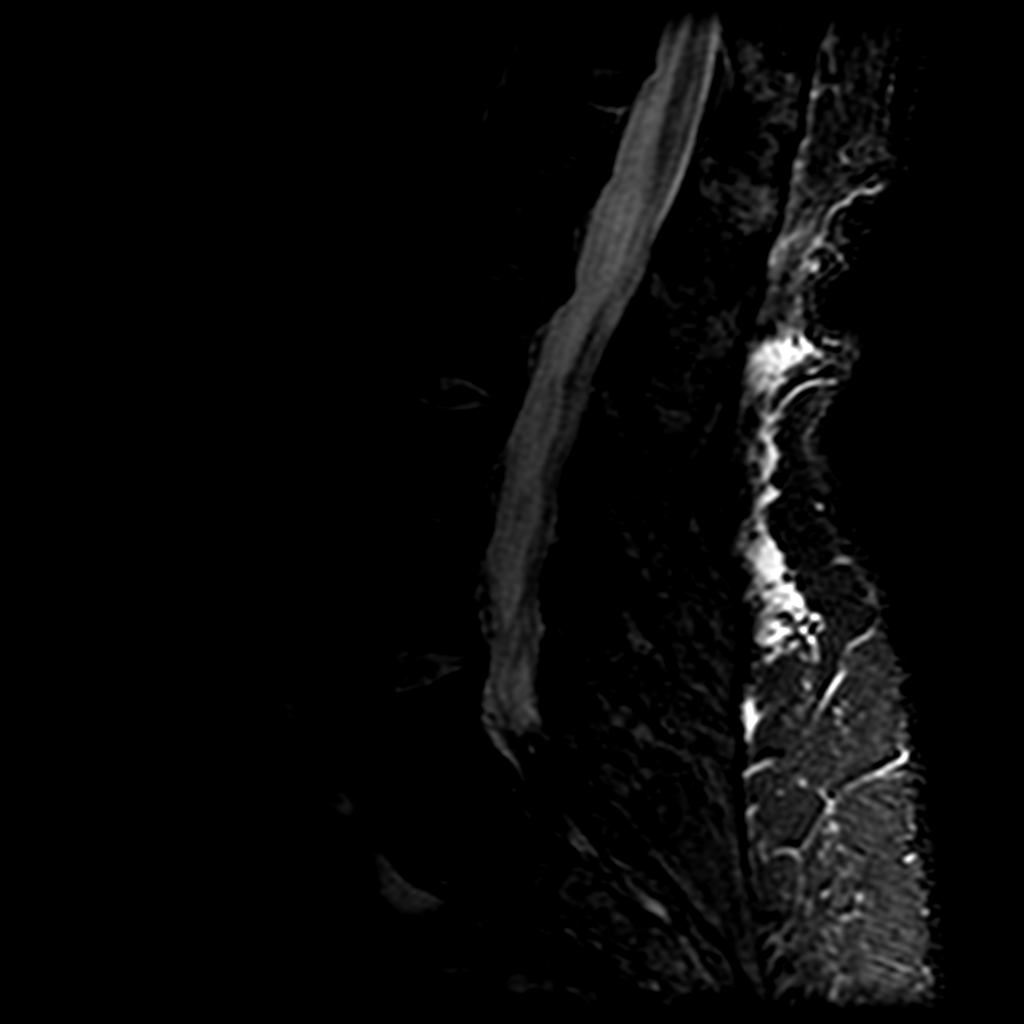

[Series 7: T2 · axial · 4.0mm · 0.35mm/px · z∈[-62,+95]mm · 3 of 25 slices shown (2 of 2)]
[im 5/25]
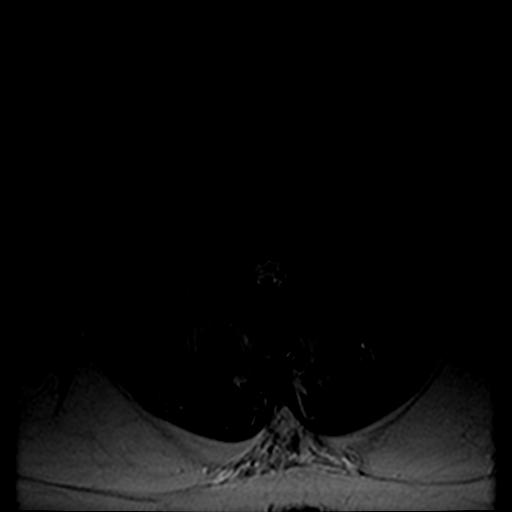
[im 14/25]
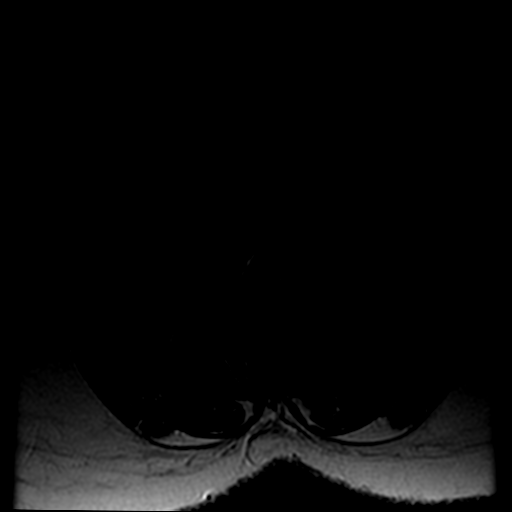
[im 22/25]
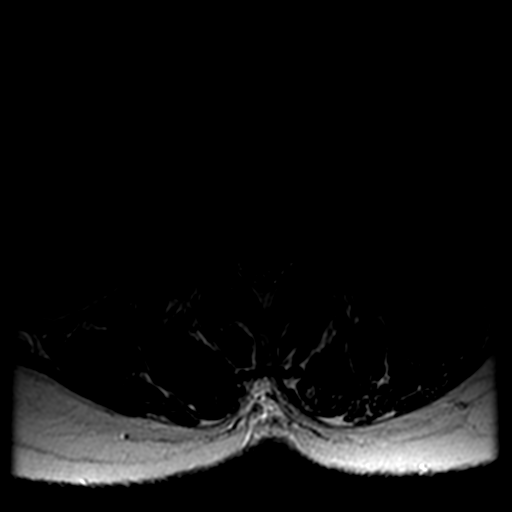

[11 of 48 positions shown; findings below may reference images not displayed]

FINDINGS: The alignment of the lumbar sacral spine is normal.  There is mild disc desiccation and disc space narrowing at T11-T12, L1-2, and at L5-S1.  Vertebral body heights are maintained.  Marrow signal is normal.  The conus terminates at the level of T12-L1 and is normal on the images provided.  
There is mild prominence of the posterior elements of L2-3 and L3-4.  
Integrity of the posterior longitudinal ligament is maintained. 
L1-2:  There is a mild diffuse bulge with mild facet prominence of the nerve roots and the neural foramina are intact. 
L2-3:  No disc herniations or protrusions are noted.  Mild facet prominence noted bilaterally. 
L3-4:  No disc herniations or protrusions are noted.  There is mild prominence of the facet joints.  
L4-5:  No disc herniations or protrusions are noted.  Mild facet and ligamental flaval prominence is noted bilaterally. Nerve roots and neural foramina are intact.  
L5-S1:  There is a right posterolateral annular disc bulge with encroachment though no discrete contact with the exiting right L5 nerve root.  Mild facet prominence is noted bilaterally. 
The paraspinal soft tissues appear to be grossly intact.
IMPRESSION: 1.  L1-2:  Mild diffuse bulge. 
2.  L5-S1:  Right posterolateral annular disc bulge.

## 2006-02-02 ENCOUNTER — Ambulatory Visit: Payer: Self-pay | Admitting: Nurse Practitioner

## 2006-02-04 ENCOUNTER — Ambulatory Visit: Payer: Self-pay | Admitting: Nurse Practitioner

## 2006-02-28 IMAGING — CT CT CHEST W/O CM
1 of 2 series · 15 of 33 positions shown, 19 images · IV contrast (agent unspecified)
Comparison: Chest CT 07/03/2004 and comparison PET scan 07/15/2004.

CLINICAL DATA: Followup nodule.
CHEST CT WITHOUT CONTRAST:

[Series 102: routine chest · axial · 0.67mm/px · z∈[-323,-73]mm · 15 of 222 slices shown, 19 images]
[im 11/222  mediastinal]
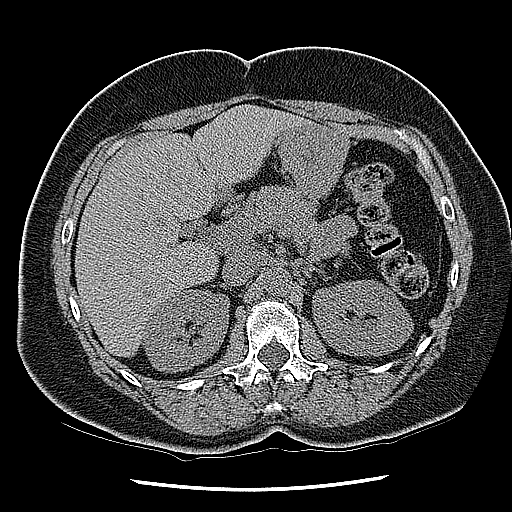
[im 11/222  lung]
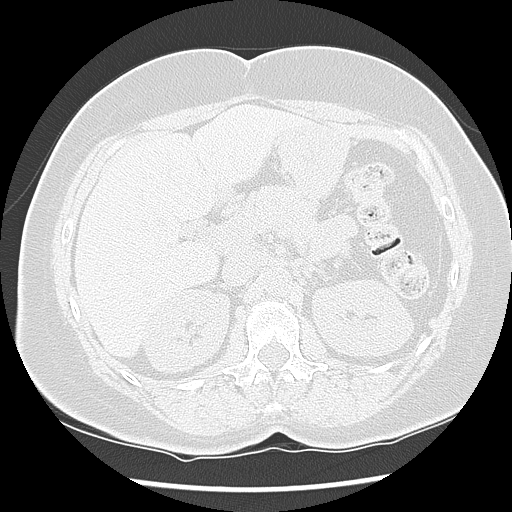
[im 32/222  lung]
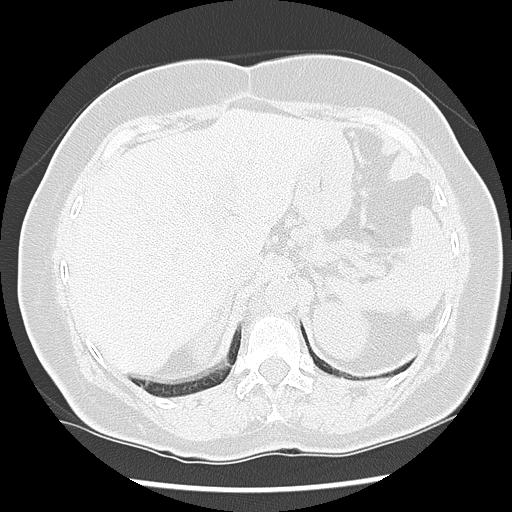
[im 53/222  lung]
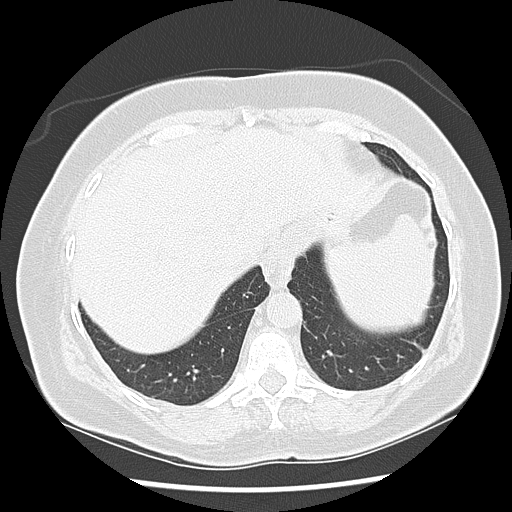
[im 56/222  lung]
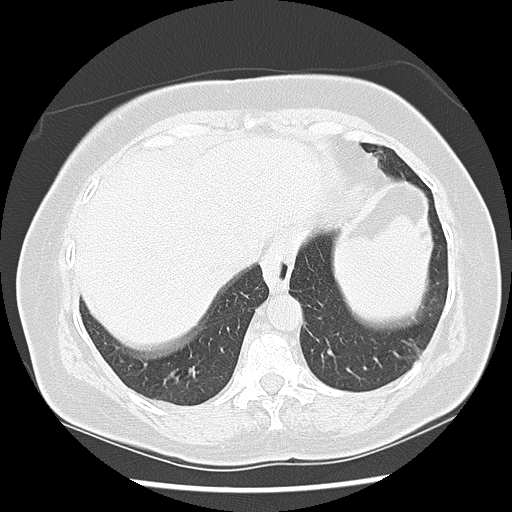
[im 74/222  mediastinal]
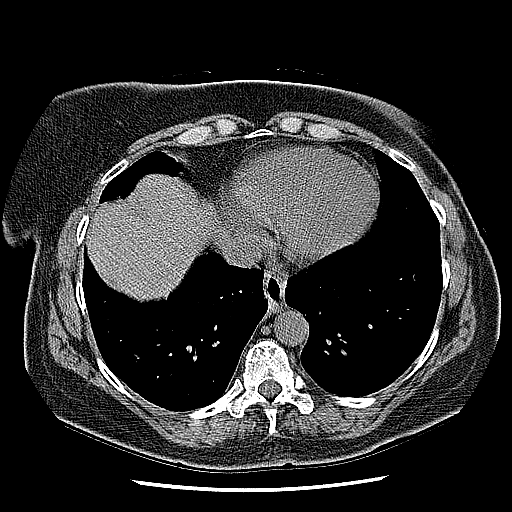
[im 74/222  lung]
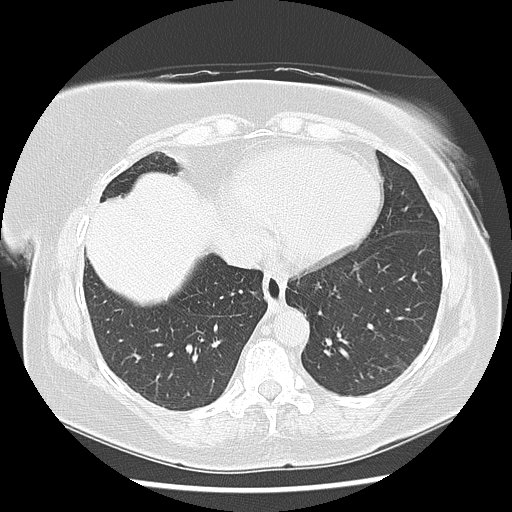
[im 85/222  lung]
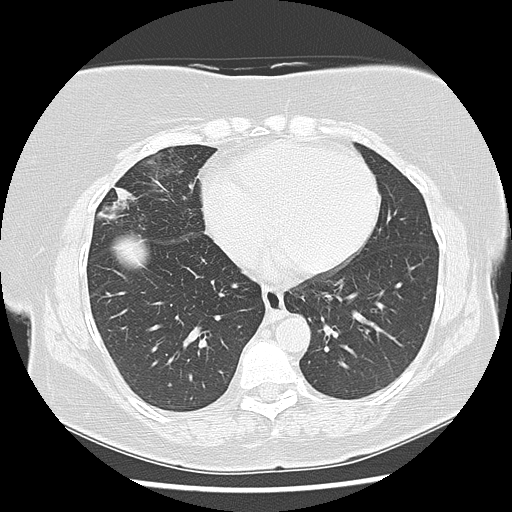
[im 104/222  lung]
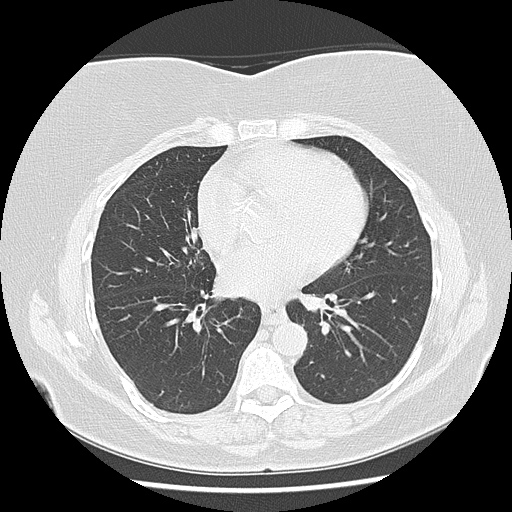
[im 111/222  lung]
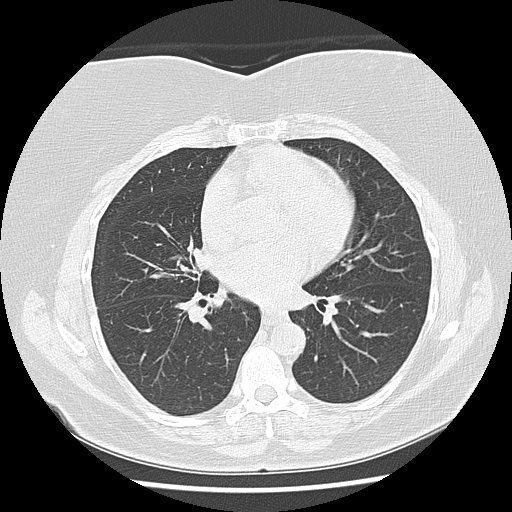
[im 116/222  mediastinal]
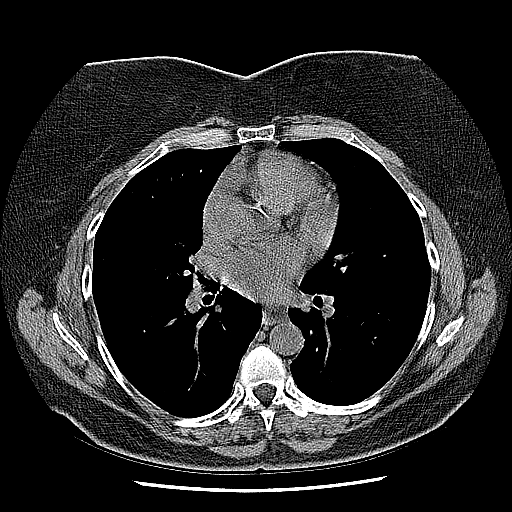
[im 116/222  lung]
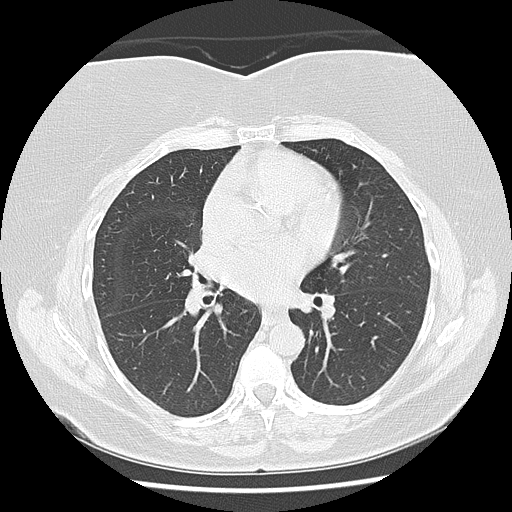
[im 137/222  lung]
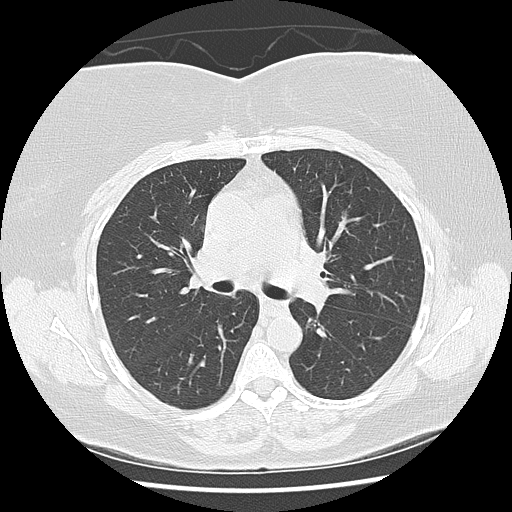
[im 148/222  lung]
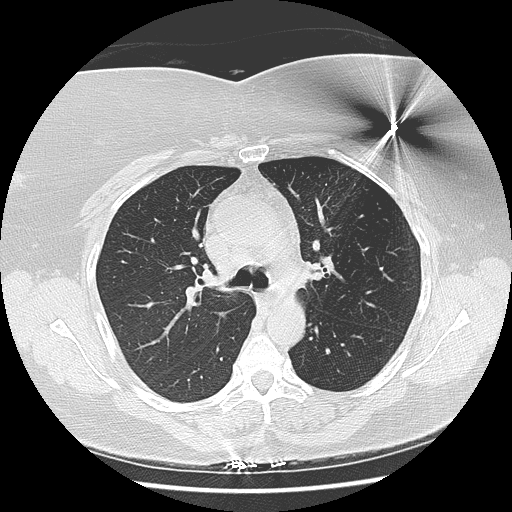
[im 166/222  lung]
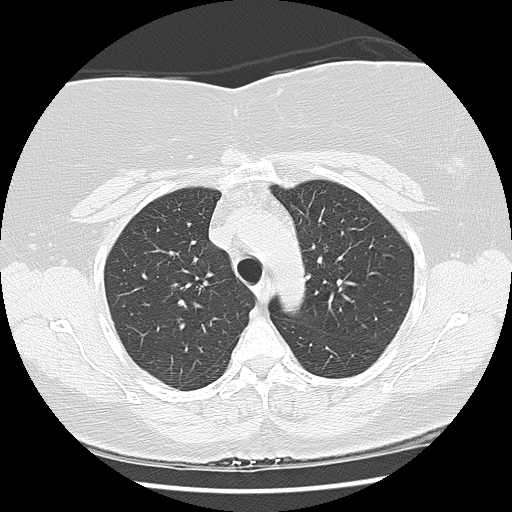
[im 179/222  mediastinal]
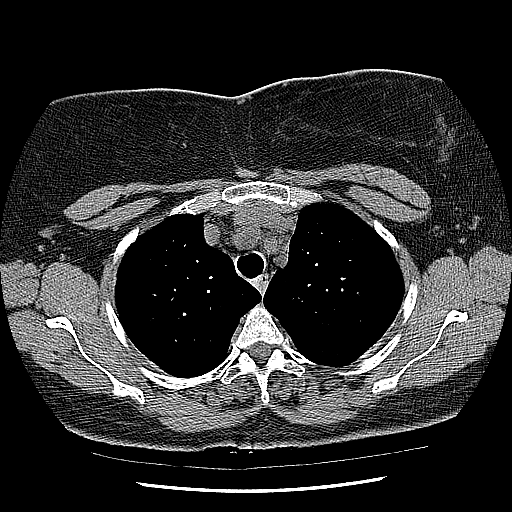
[im 179/222  lung]
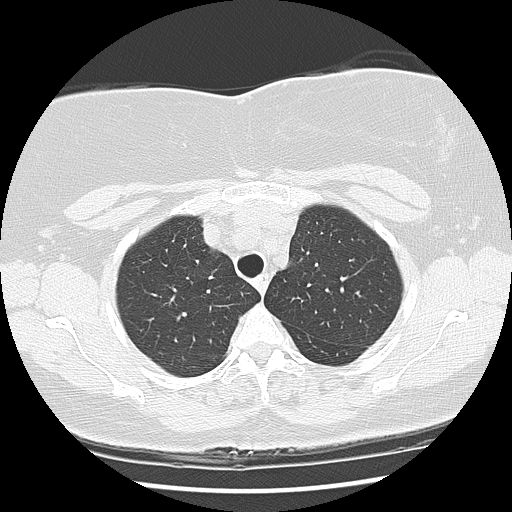
[im 190/222  lung]
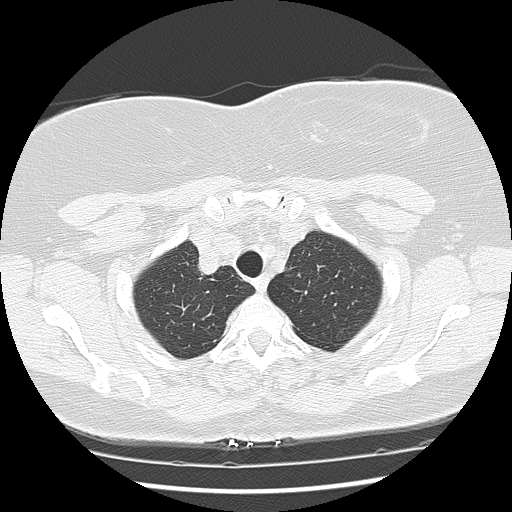
[im 211/222  lung]
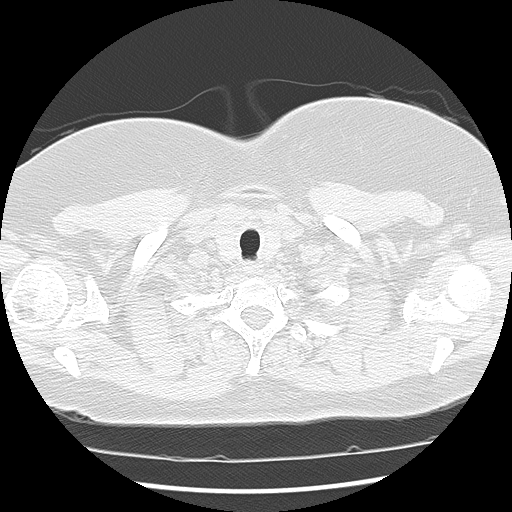

[15 of 33 positions shown; findings below may reference images not displayed]

There is a stable appearance of the nodule within the right lung near the major/minor fissure junction measuring 9.2 x 7.6 mm and previously measuring 9.1 x 7.6 mm.  Continued CT surveillance recommended until this remains stable over two years with the next followup examination in six months.  At such time, complete chest CT without contrast is recommended to help further evaluate inferior right middle lobe atelectatic changes and scarring appearing minimally more prominent than on the prior examination and therefore superimposed mild pneumonitis type changes cannot be excluded.  The pleural-based opacities along the peripheral aspect of the left upper lung zone have cleared in the interim and probably represent a small area of atelectasis.  Remainder of findings without change.
IMPRESSION: Stable appearance of right lung noncalcified nodule with minimal progression of lung parenchymal changes along the inferior aspect of the right middle lobe. Followup chest CT in six months time recommended to help further confirm stability of these changes (CT can be obtained sooner if clinically indicated).

## 2006-04-14 ENCOUNTER — Ambulatory Visit: Payer: Self-pay | Admitting: Nurse Practitioner

## 2006-04-16 ENCOUNTER — Ambulatory Visit (HOSPITAL_COMMUNITY): Admission: RE | Admit: 2006-04-16 | Discharge: 2006-04-16 | Payer: Self-pay | Admitting: Family Medicine

## 2006-05-27 ENCOUNTER — Ambulatory Visit: Payer: Self-pay | Admitting: Nurse Practitioner

## 2006-06-18 ENCOUNTER — Ambulatory Visit: Payer: Self-pay | Admitting: Nurse Practitioner

## 2006-07-02 ENCOUNTER — Ambulatory Visit: Payer: Self-pay | Admitting: Nurse Practitioner

## 2006-07-22 ENCOUNTER — Ambulatory Visit: Payer: Self-pay | Admitting: Internal Medicine

## 2006-07-28 ENCOUNTER — Ambulatory Visit: Payer: Self-pay

## 2006-07-28 ENCOUNTER — Ambulatory Visit: Payer: Self-pay | Admitting: Cardiovascular Disease

## 2006-07-30 ENCOUNTER — Ambulatory Visit: Payer: Self-pay | Admitting: Nurse Practitioner

## 2006-08-03 ENCOUNTER — Ambulatory Visit: Payer: Self-pay

## 2006-08-03 HISTORY — PX: CARDIOVASCULAR STRESS TEST: SHX262

## 2006-08-23 IMAGING — CT CT ABDOMEN W/O CM
2 of 3 series · 17 of 46 positions shown, 19 images · IV contrast (agent unspecified)
Comparison: 07/03/04.

CLINICAL DATA: One-week history of flank pain, left lower quadrant pain, and suprapubic pain.
 ABDOMEN CT WITHOUT CONTRAST:
TECHNIQUE: Multidetector CT imaging of the abdomen was performed following the standard protocol without IV contrast.
TECHNIQUE: Multidetector CT imaging of the pelvis was performed following the standard protocol without IV contrast.

[Series 2: stone proto 5.0 b31f · axial · 0.71mm/px · z∈[+812,+1192]mm · 14 of 88 slices shown, 16 images]
[im 6/88  soft-tissue]
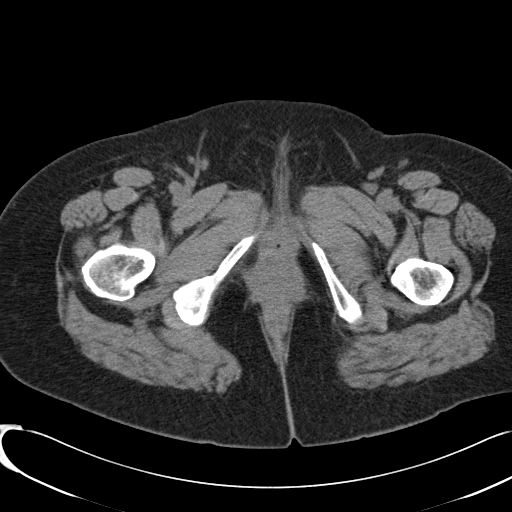
[im 6/88  bone]
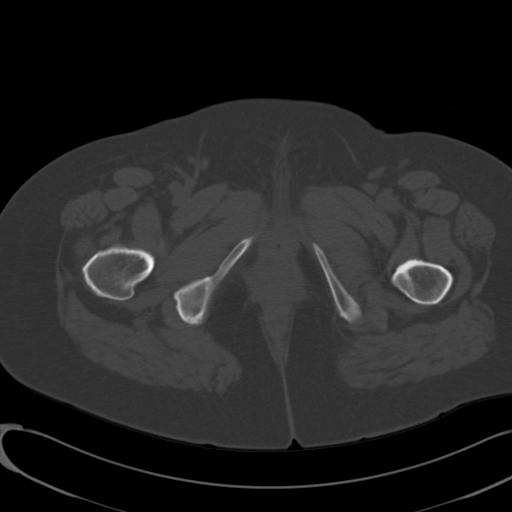
[im 12/88  soft-tissue]
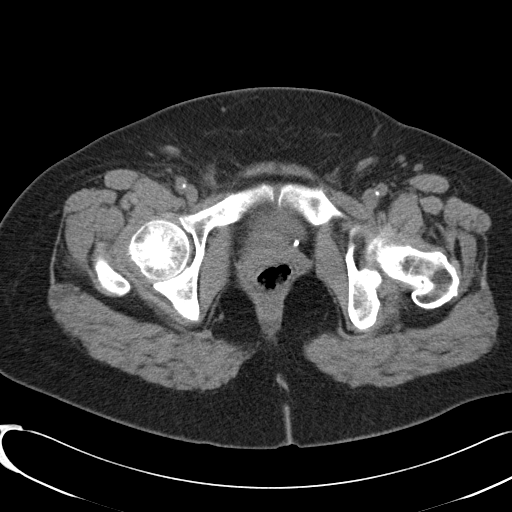
[im 17/88  soft-tissue]
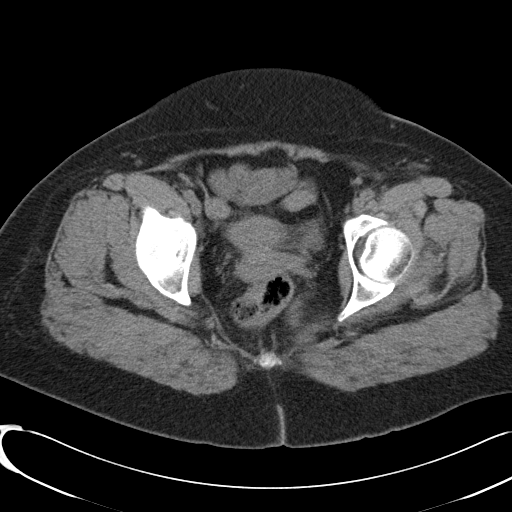
[im 23/88  soft-tissue]
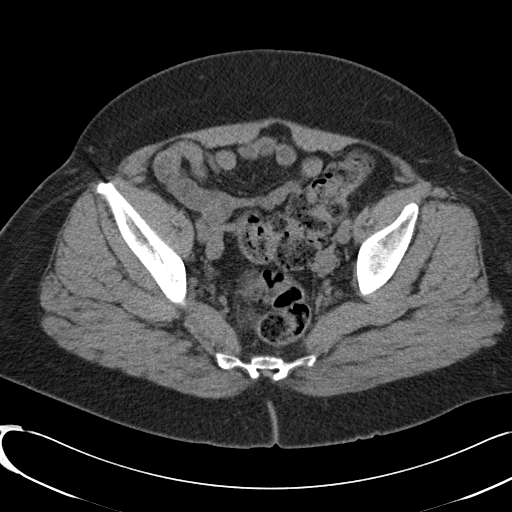
[im 29/88  soft-tissue]
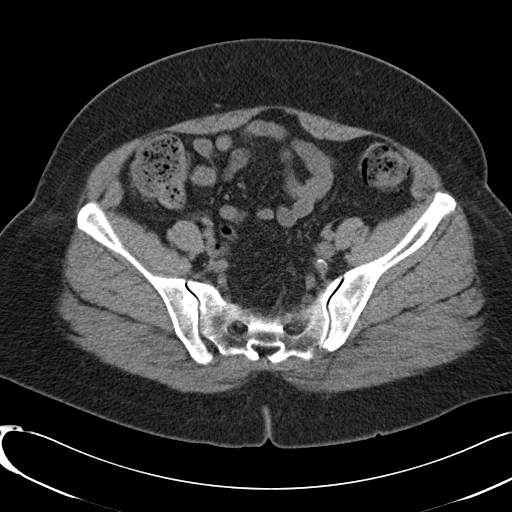
[im 34/88  soft-tissue]
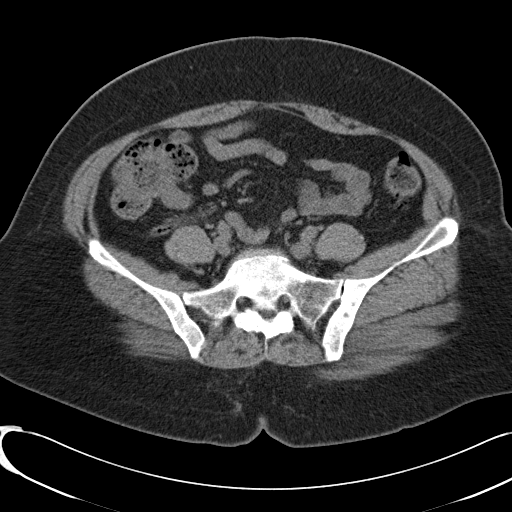
[im 40/88  soft-tissue]
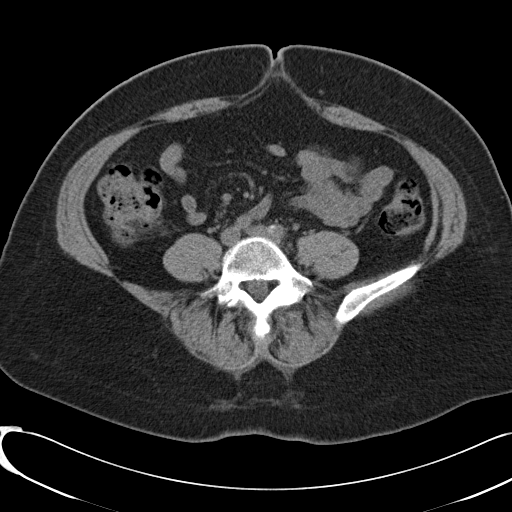
[im 48/88  soft-tissue]
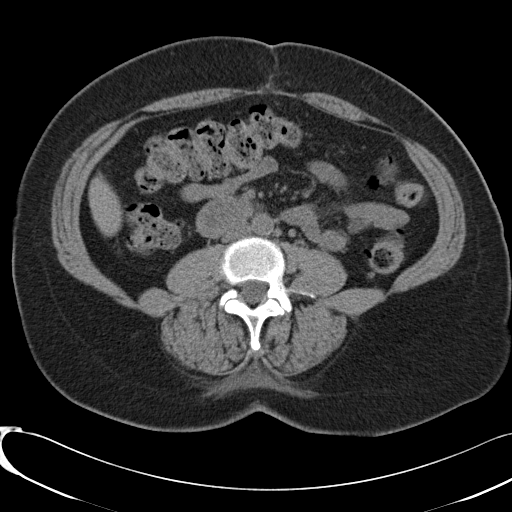
[im 54/88  soft-tissue]
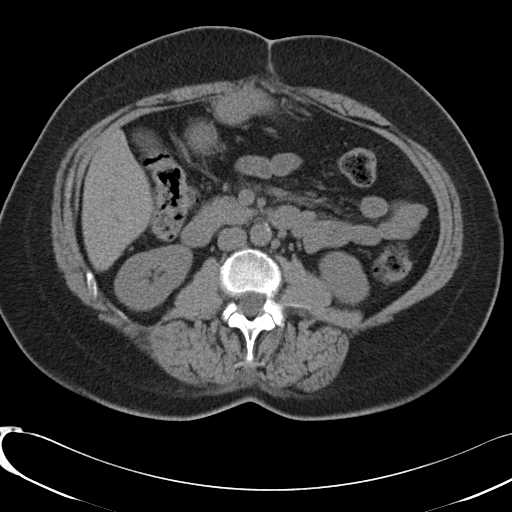
[im 54/88  bone]
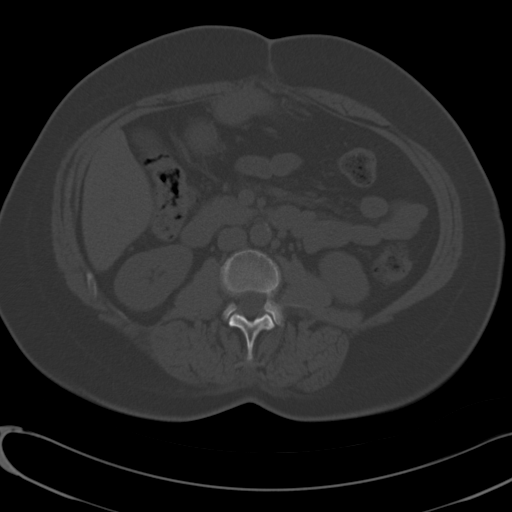
[im 59/88  soft-tissue]
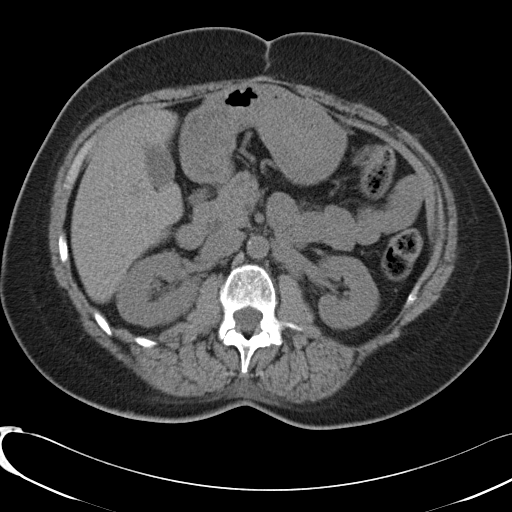
[im 65/88  soft-tissue]
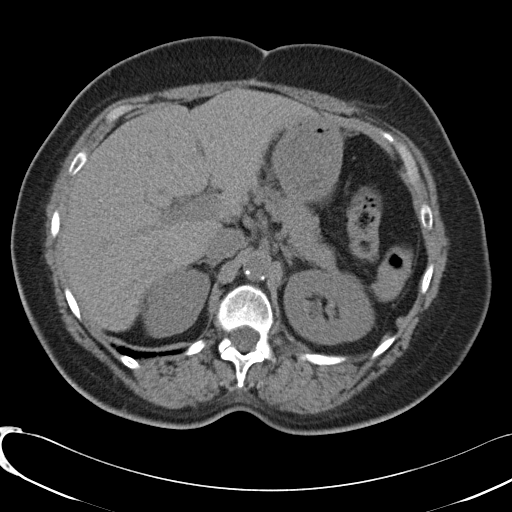
[im 71/88  soft-tissue]
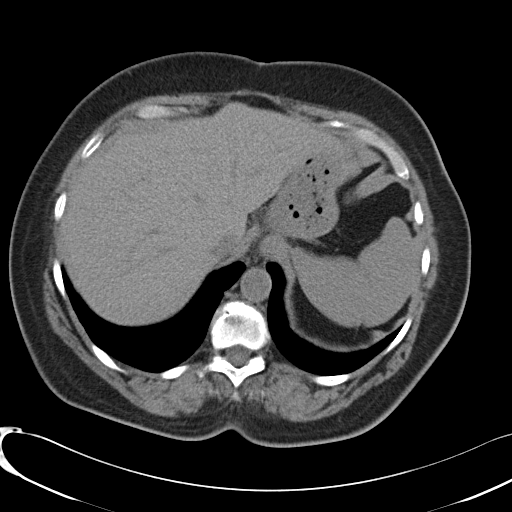
[im 76/88  soft-tissue]
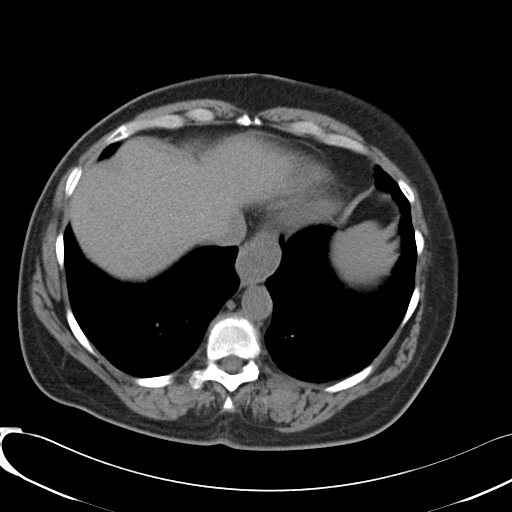
[im 82/88  soft-tissue]
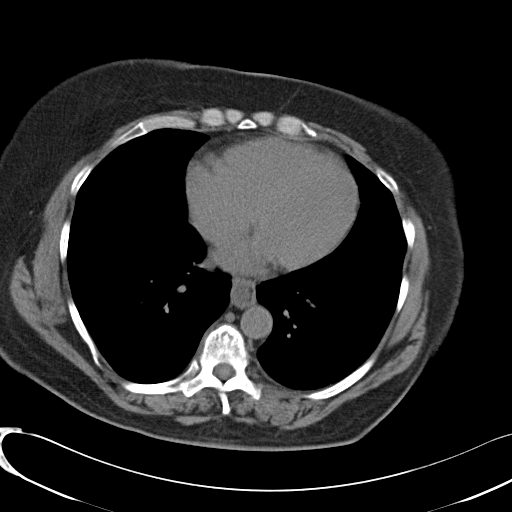

[Series 602: coronal thins · coronal · 0.86mm/px · 3 of 59 slices shown]
[im 20/59  soft-tissue]
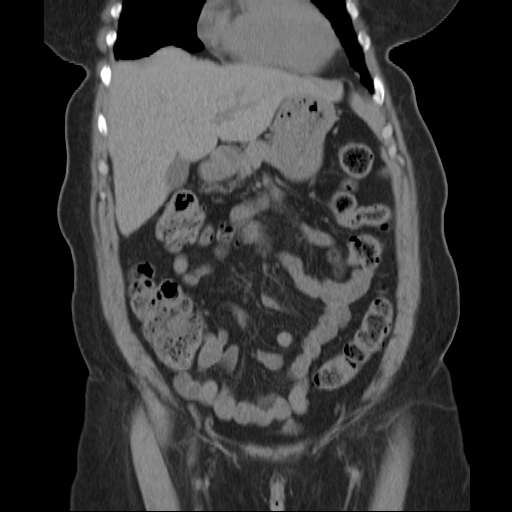
[im 26/59  soft-tissue]
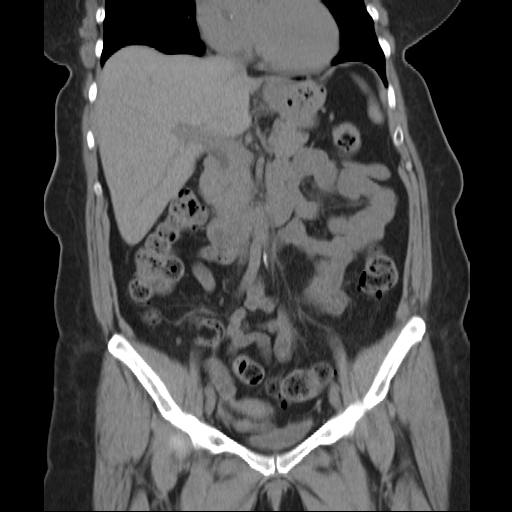
[im 33/59  soft-tissue]
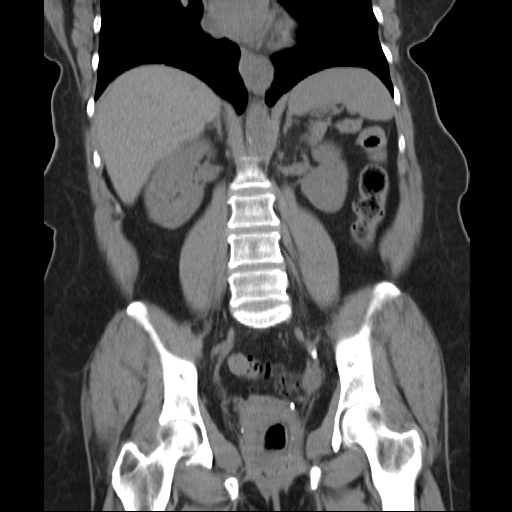

[17 of 46 positions shown; findings below may reference images not displayed]

FINDINGS: No focal abnormality is seen in the liver or spleen, although this is an uninfused exam.   There is a small hiatal hernia noted.   Duodenum, pancreas, gallbladder, and adrenal glands are unremarkable.   1.3 cm low density lesion in the upper pole of the right kidney is stable and likely represents a cyst.   Multiple other tiny low density renal lesions seen on the previous study are not apparent on this uninfused exam.   No free fluid, lymphadenopathy, or abdominal aortic aneurysm.
IMPRESSION: Stable exam.   No acute findings. 
 PELVIS CT WITHOUT CONTRAST:
FINDINGS: There is no evidence for free fluid.   No lymphadenopathy.   No evidence for adnexal mass.   Diverticular change is noted in the left colon without evidence for diverticulitis.   Terminal ileum and appendix are normal.
IMPRESSION: 1.  Stable exam.   No acute findings in the anatomic pelvis.
 2.  Colonic diverticulosis without evidence for diverticulitis.

## 2006-08-29 IMAGING — CT CT CHEST W/O CM
2 of 4 series · 15 of 36 positions shown, 18 images · IV contrast (agent unspecified)
Comparison: 10/08/04.

CLINICAL DATA: Pulmonary nodule.  Worsening shortness of breath.  
CHEST CT WITHOUT CONTRAST:
TECHNIQUE: Multidetector CT imaging of the chest was performed following the standard protocol without IV contrast.

[Series 2: routine chest 5.0 st · axial · 0.59mm/px · z∈[-139,+91]mm · 12 of 56 slices shown, 15 images]
[im 5/56  mediastinal]
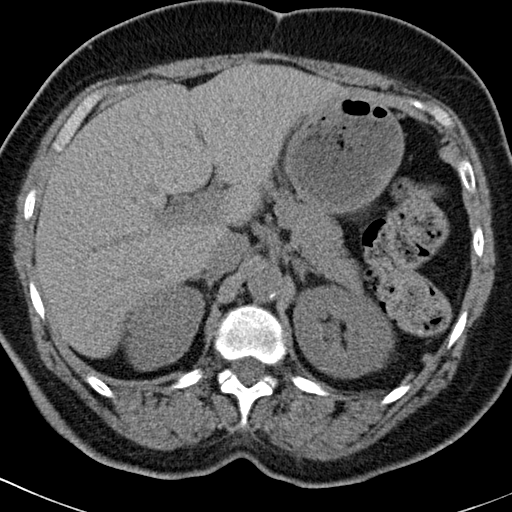
[im 5/56  lung]
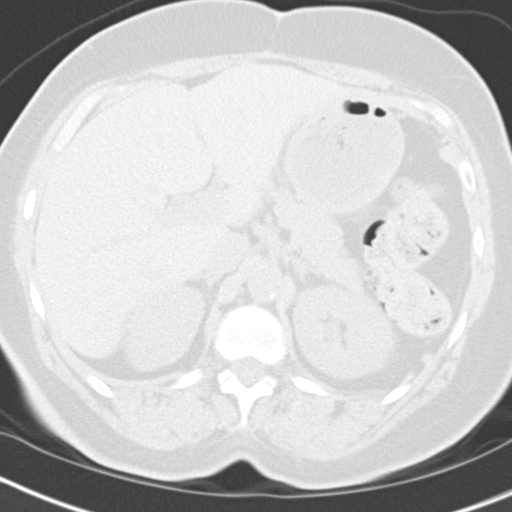
[im 9/56  lung]
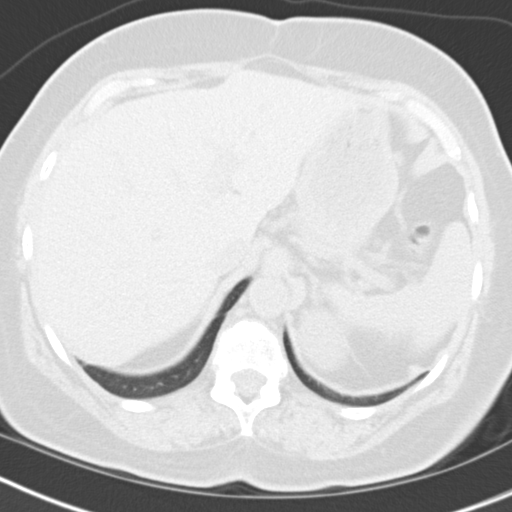
[im 13/56  lung]
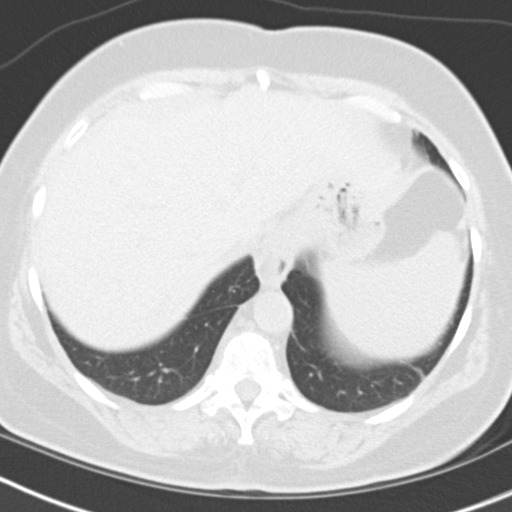
[im 17/56  lung]
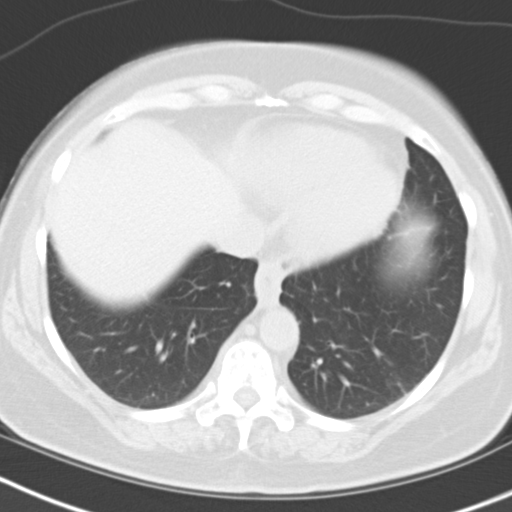
[im 22/56  mediastinal]
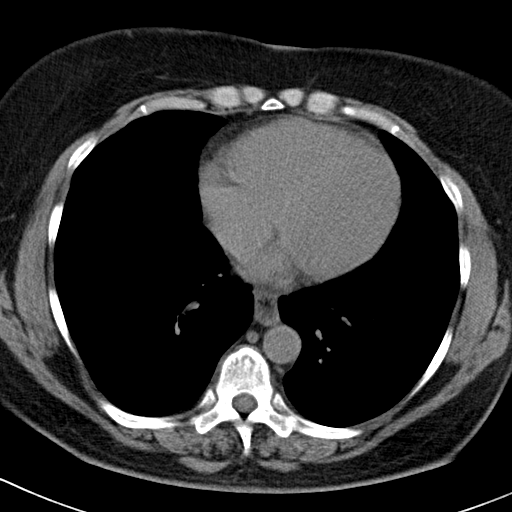
[im 22/56  lung]
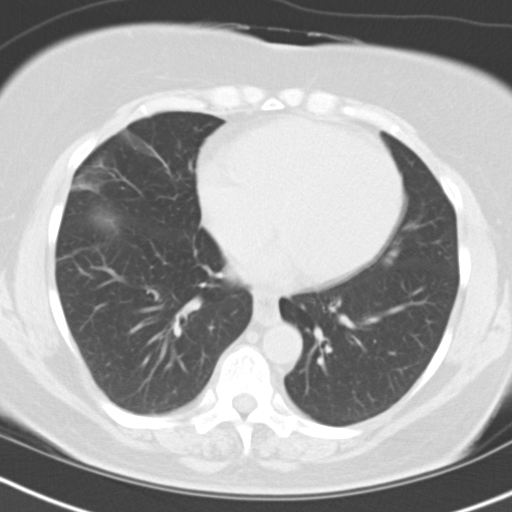
[im 26/56  lung]
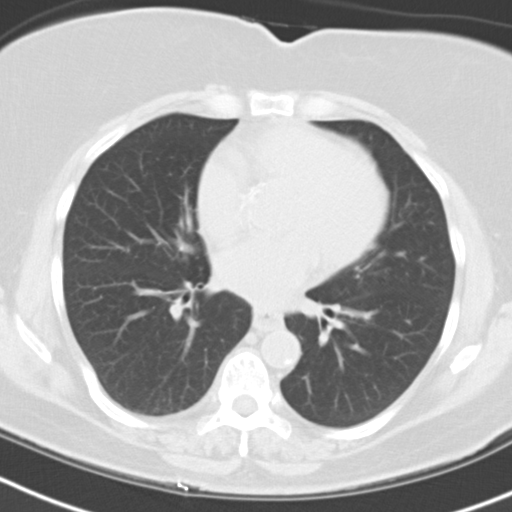
[im 30/56  lung]
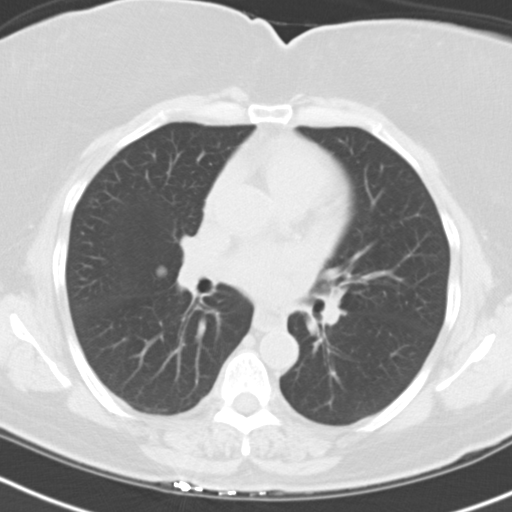
[im 34/56  lung]
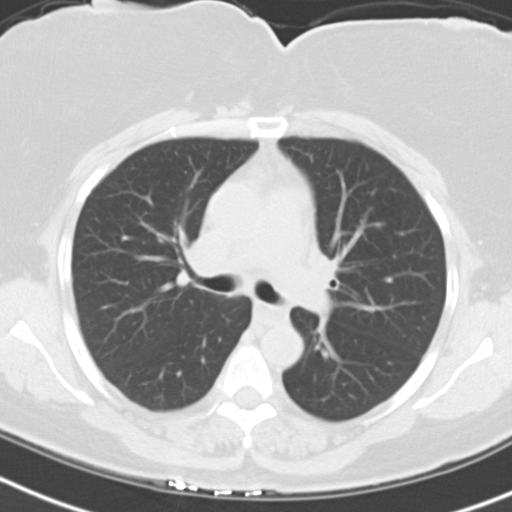
[im 39/56  mediastinal]
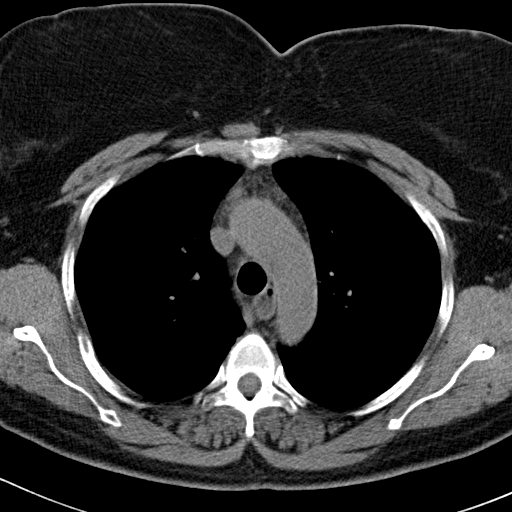
[im 39/56  lung]
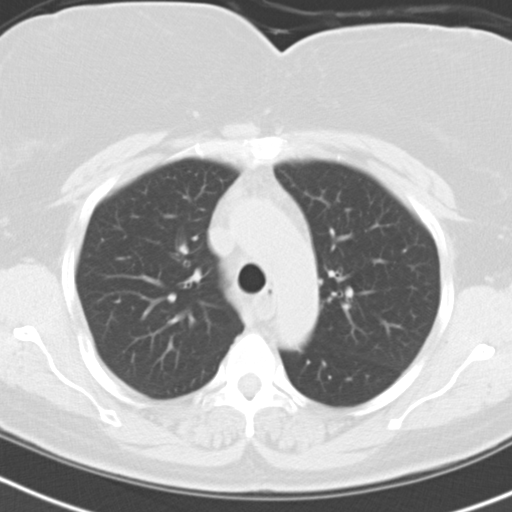
[im 43/56  lung]
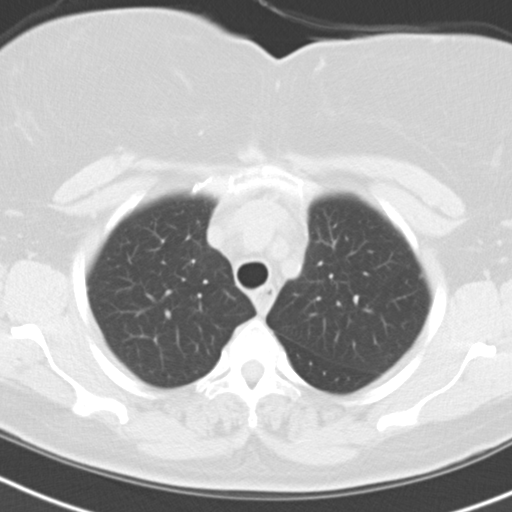
[im 47/56  lung]
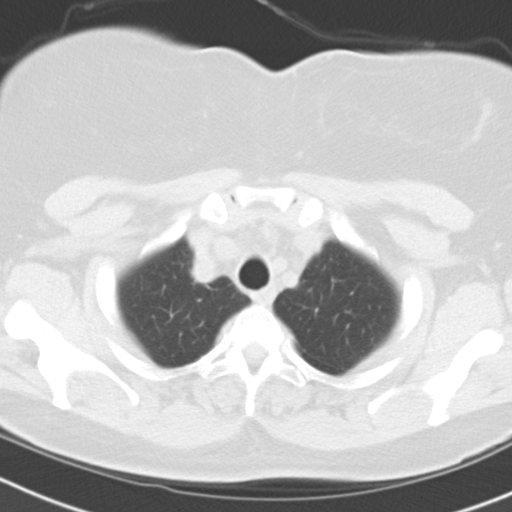
[im 51/56  lung]
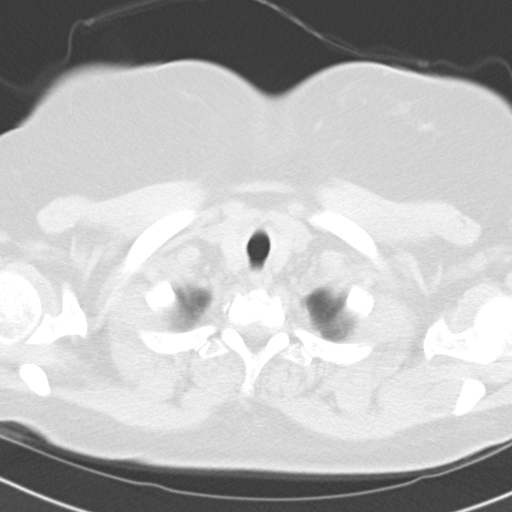

[Series 603: cor · coronal · 0.59mm/px · 3 of 120 slices shown]
[im 24/120  lung]
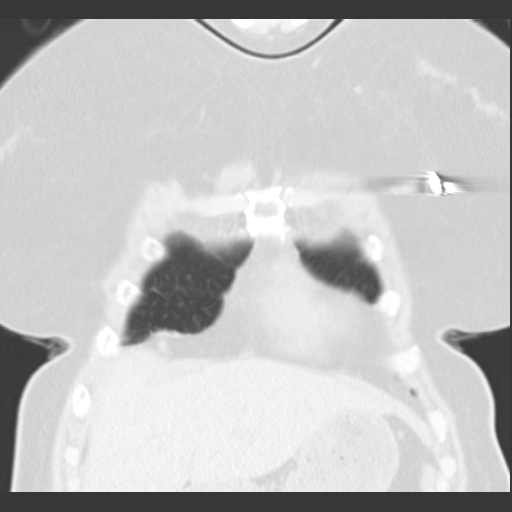
[im 48/120  lung]
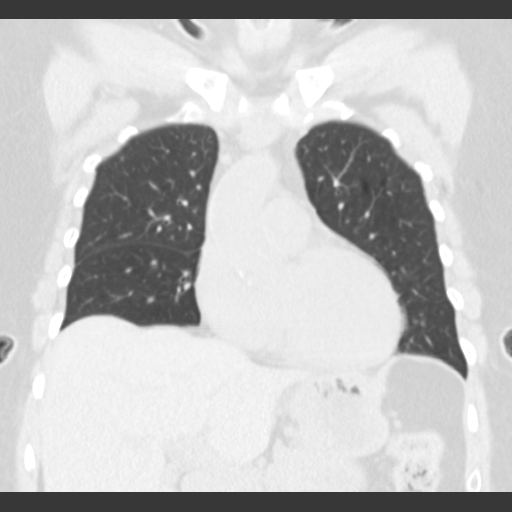
[im 72/120  lung]
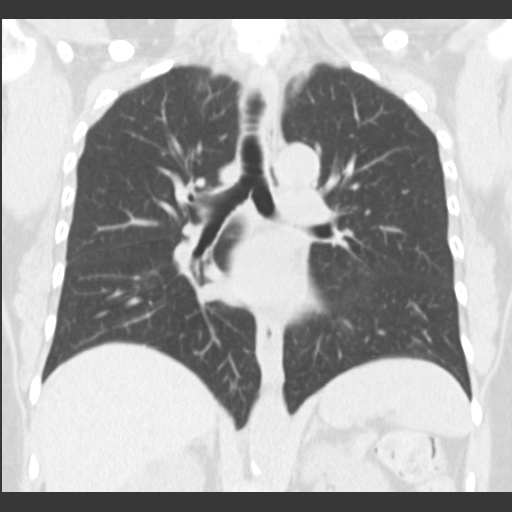

[15 of 36 positions shown; findings below may reference images not displayed]

FINDINGS: No evidence for axillary, mediastinal or hilar adenopathy.  Heart size is normal.  No pericardial or pleural effusion.  
Lung windows demonstrate the previously characterized pulmonary nodule which measures 8 X 7 mm today.  This is completely stable comparing to the study from 10/08/04 and also from 07/03/04.  Lungs are otherwise clear.  The tiny subpleural nodular opacity seen in the superior segment of the left lower lobe on the 07/03/04 study is not apparent today.  
Metallic fragment again noted in the left breast.
IMPRESSION: No interval change in the small nodule in the right lung, just anterior to the major fissure.  On soft tissue windows, there is a suggestion that this lesion contains fat and it is probably a hamartoma although it cannot be definitely characterized.  The nine months of confirmed imaging stability is reassuring.  Repeat CT scan of the chest without contrast in twelve months is recommended for further evaluation as two years of imaging stability is considered a sign of benignancy in most pulmonary nodules.

## 2006-09-01 ENCOUNTER — Ambulatory Visit: Payer: Self-pay | Admitting: Internal Medicine

## 2006-09-01 LAB — CONVERTED CEMR LAB
Basophils Absolute: 0 10*3/uL (ref 0.0–0.1)
Basophils Relative: 0.6 % (ref 0.0–1.0)
Eosinophils Absolute: 0.1 10*3/uL (ref 0.0–0.6)
Eosinophils Relative: 1.3 % (ref 0.0–5.0)
HCT: 34.6 % — ABNORMAL LOW (ref 36.0–46.0)
Hemoglobin: 11.5 g/dL — ABNORMAL LOW (ref 12.0–15.0)
Iron: 77 ug/dL (ref 42–145)
Lymphocytes Relative: 30.6 % (ref 12.0–46.0)
MCHC: 33.1 g/dL (ref 30.0–36.0)
MCV: 73.7 fL — ABNORMAL LOW (ref 78.0–100.0)
Monocytes Absolute: 0.6 10*3/uL (ref 0.2–0.7)
Monocytes Relative: 7.4 % (ref 3.0–11.0)
Neutro Abs: 4.6 10*3/uL (ref 1.4–7.7)
Neutrophils Relative %: 60.1 % (ref 43.0–77.0)
Platelets: 276 10*3/uL (ref 150–400)
RBC: 4.69 M/uL (ref 3.87–5.11)
RDW: 12.8 % (ref 11.5–14.6)
Saturation Ratios: 19.1 % — ABNORMAL LOW (ref 20.0–50.0)
Transferrin: 288.6 mg/dL (ref >=212.0)
WBC: 7.6 10*3/uL (ref 4.5–10.5)

## 2006-09-03 ENCOUNTER — Ambulatory Visit (HOSPITAL_COMMUNITY): Admission: RE | Admit: 2006-09-03 | Discharge: 2006-09-03 | Payer: Self-pay | Admitting: Internal Medicine

## 2006-10-02 DIAGNOSIS — F329 Major depressive disorder, single episode, unspecified: Secondary | ICD-10-CM | POA: Insufficient documentation

## 2006-10-02 DIAGNOSIS — R0609 Other forms of dyspnea: Secondary | ICD-10-CM | POA: Insufficient documentation

## 2006-10-02 DIAGNOSIS — I119 Hypertensive heart disease without heart failure: Secondary | ICD-10-CM | POA: Insufficient documentation

## 2006-10-02 DIAGNOSIS — K589 Irritable bowel syndrome without diarrhea: Secondary | ICD-10-CM | POA: Insufficient documentation

## 2006-10-02 DIAGNOSIS — E119 Type 2 diabetes mellitus without complications: Secondary | ICD-10-CM | POA: Insufficient documentation

## 2006-10-02 DIAGNOSIS — K219 Gastro-esophageal reflux disease without esophagitis: Secondary | ICD-10-CM | POA: Insufficient documentation

## 2006-10-02 DIAGNOSIS — F3289 Other specified depressive episodes: Secondary | ICD-10-CM | POA: Insufficient documentation

## 2006-10-02 DIAGNOSIS — I10 Essential (primary) hypertension: Secondary | ICD-10-CM

## 2006-10-02 DIAGNOSIS — R0989 Other specified symptoms and signs involving the circulatory and respiratory systems: Secondary | ICD-10-CM | POA: Insufficient documentation

## 2006-11-15 ENCOUNTER — Ambulatory Visit (HOSPITAL_COMMUNITY): Admission: RE | Admit: 2006-11-15 | Discharge: 2006-11-15 | Payer: Self-pay | Admitting: Internal Medicine

## 2006-12-03 ENCOUNTER — Encounter: Admission: RE | Admit: 2006-12-03 | Discharge: 2006-12-03 | Payer: Self-pay | Admitting: Nurse Practitioner

## 2006-12-14 ENCOUNTER — Ambulatory Visit: Payer: Self-pay | Admitting: Thoracic Surgery

## 2006-12-15 ENCOUNTER — Encounter: Admission: RE | Admit: 2006-12-15 | Discharge: 2006-12-15 | Payer: Self-pay | Admitting: Nurse Practitioner

## 2006-12-30 IMAGING — CT CT ANGIO CHEST
1 of 6 series · 10 of 30 positions shown · IV contrast (120 ML OMNI 300)
Comparison: none

CLINICAL DATA: Chest pain.  Question pulmonary embolus.  History of hiatal hernia repair.  
 CT ANGIOGRAPHY OF CHEST:
TECHNIQUE: Multidetector CT imaging of the chest was performed during bolus injection of intravenous contrast.  Multiplanar CT angiographic image reconstructions were generated to evaluate the vascular anatomy.
 Contrast:  120 cc of Omnipaque 300.

[Series 4: pe · axial · 0.66mm/px · z∈[-275,-58]mm · 10 of 218 slices shown]
[im 22/218  lung]
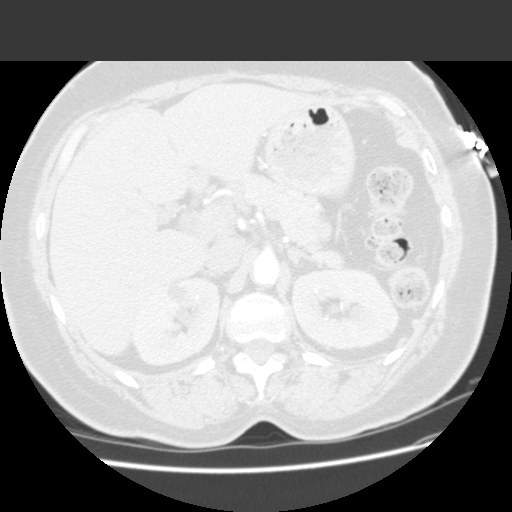
[im 44/218  mediastinal]
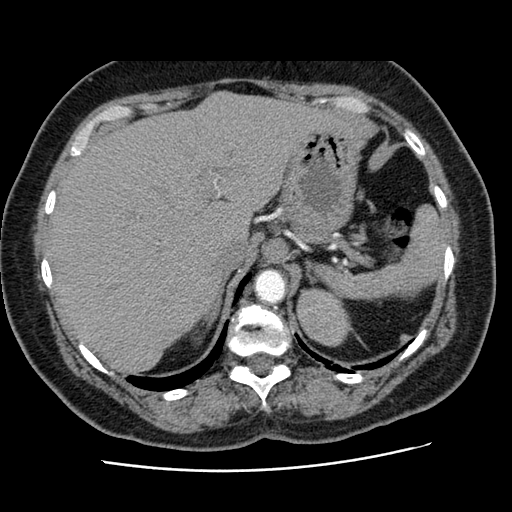
[im 66/218  lung]
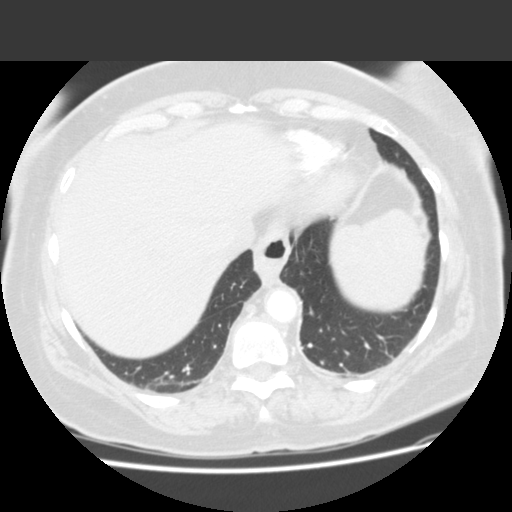
[im 87/218  mediastinal]
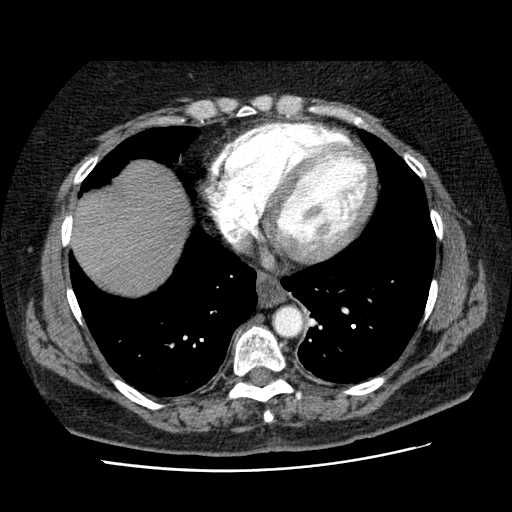
[im 102/218  lung]
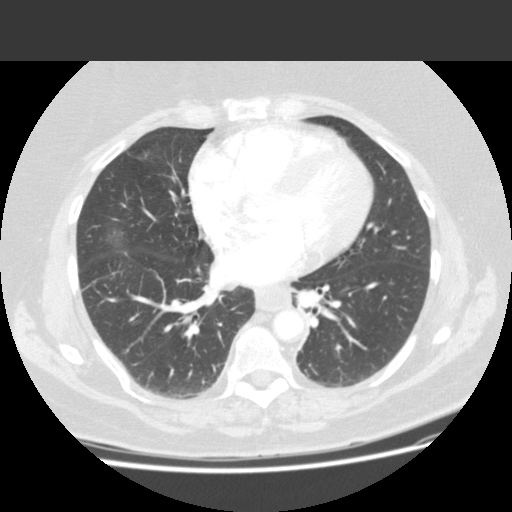
[im 109/218  mediastinal]
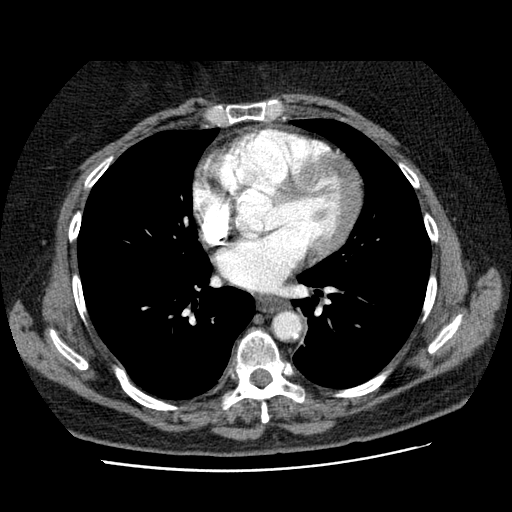
[im 131/218  lung]
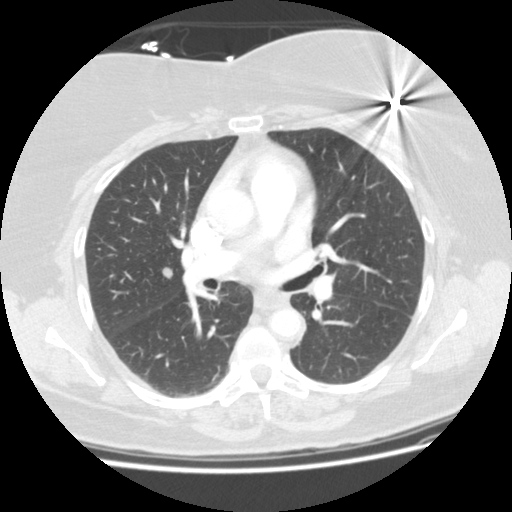
[im 152/218  mediastinal]
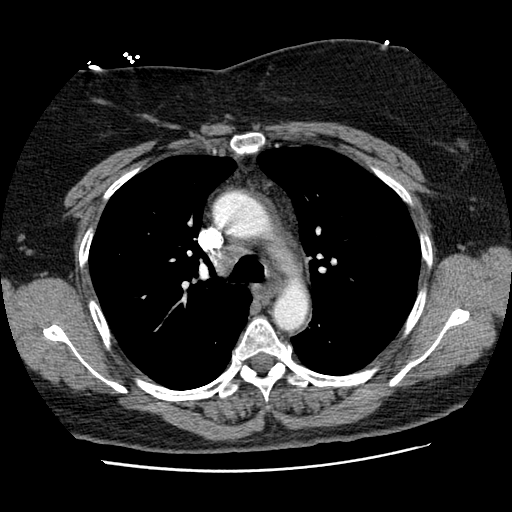
[im 174/218  lung]
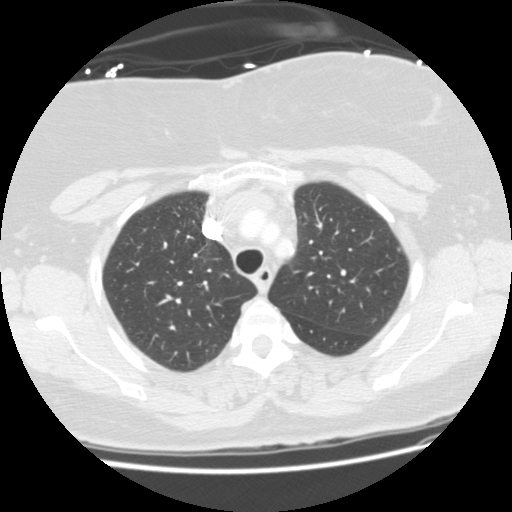
[im 196/218  mediastinal]
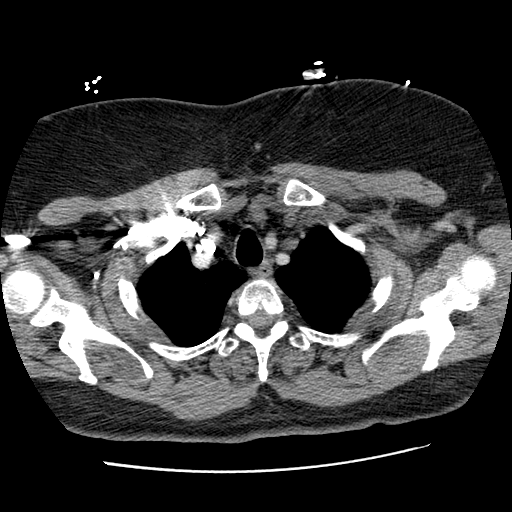

[10 of 30 positions shown; findings below may reference images not displayed]

FINDINGS: There is no chest CT evidence of pulmonary embolus.  Fluid is seen in the esophagus possibly due to reflux.  No axillary, hilar or mediastinal lymphadenopathy.  No pleural or pericardial effusion.  Lungs again demonstrate a right middle lobe pulmonary nodule which is unchanged in size in comparison to a study 07/03/2004, the patient?s first chest CT.   Some mild dependent atelectasis with the lungs otherwise unremarkable.  Incidental imaging of the upper abdomen is unremarkable. 
 No focal bony abnormality.
IMPRESSION: 1.  Negative for pulmonary embolus or other acute finding. 
 2.  Stable right middle lobe pulmonary nodule.  Recommend repeat CT scan in 1 year to assure benignity.

## 2006-12-30 IMAGING — CR DG CERVICAL SPINE COMPLETE 4+V
7 series · 7 of 7 positions shown · non-contrast
Comparison: none

CLINICAL DATA: 65-year-old female with neck pain.   
 CERVICAL SPINE - 4 VIEW:

[w c-spine lat (1 of 3)]
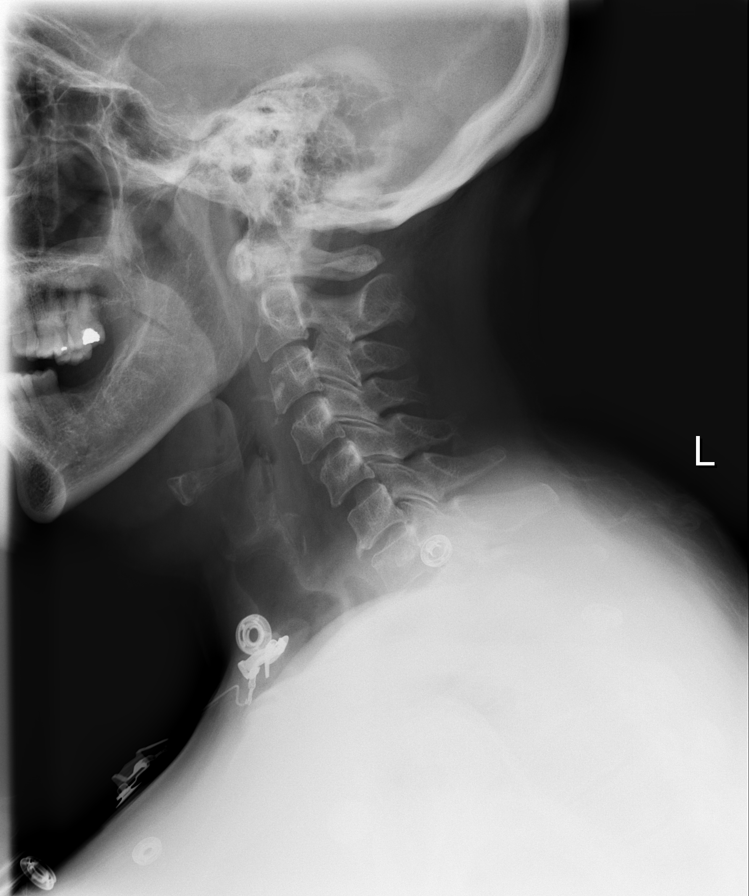

[w c-spine lat (2 of 3)]
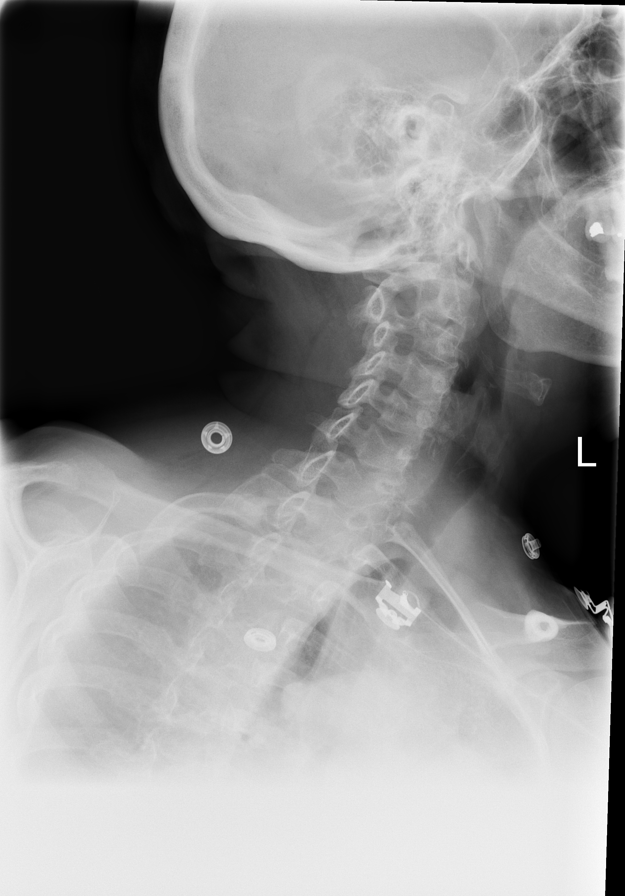

[w c-spine lat (3 of 3)]
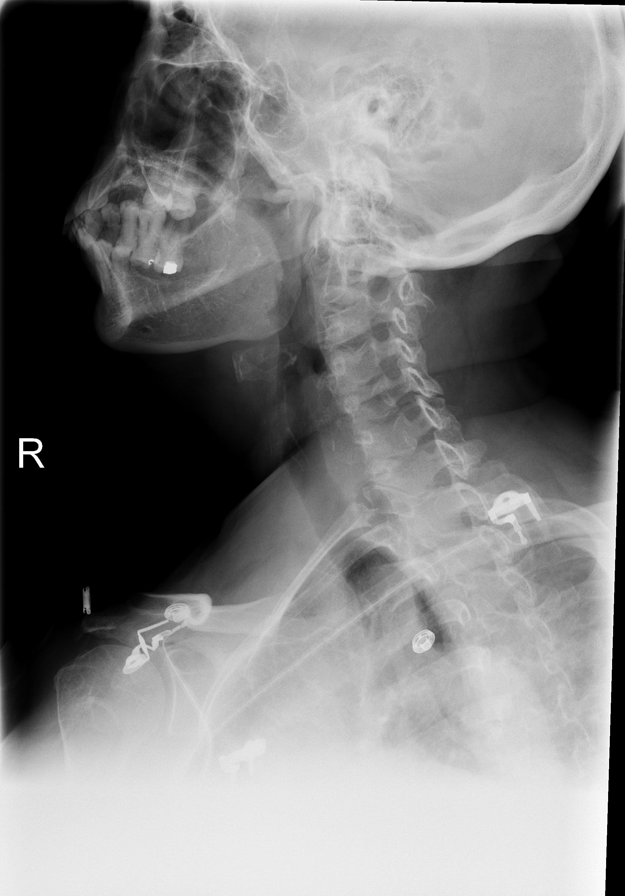

[w c-spine a.p.]
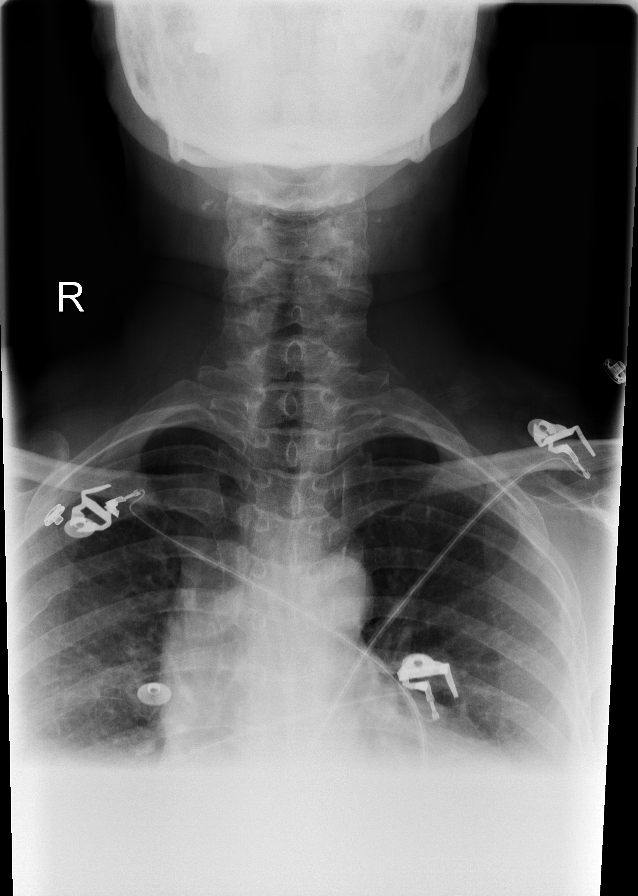

[w c-spine odontoid (1 of 2)]
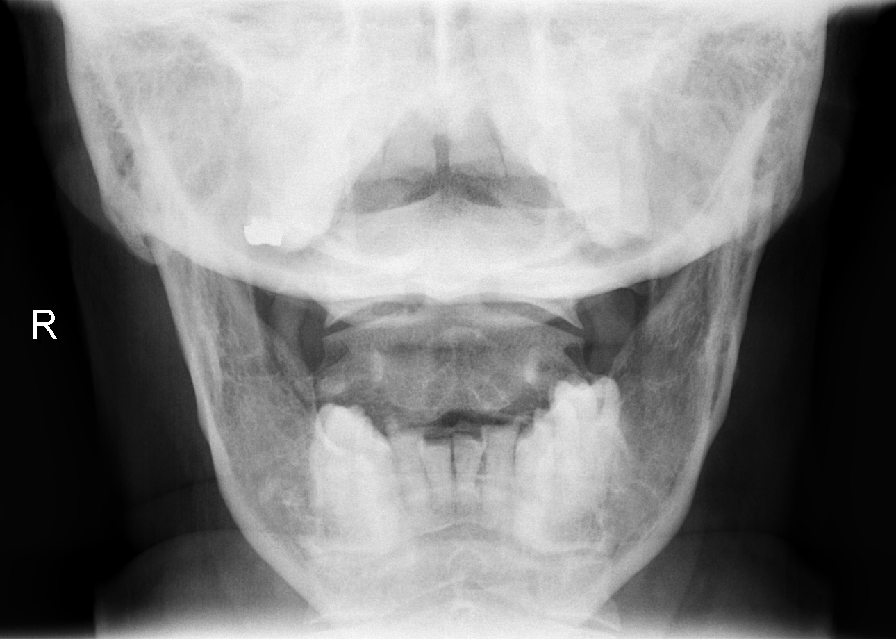

[w c-spine odontoid (2 of 2)]
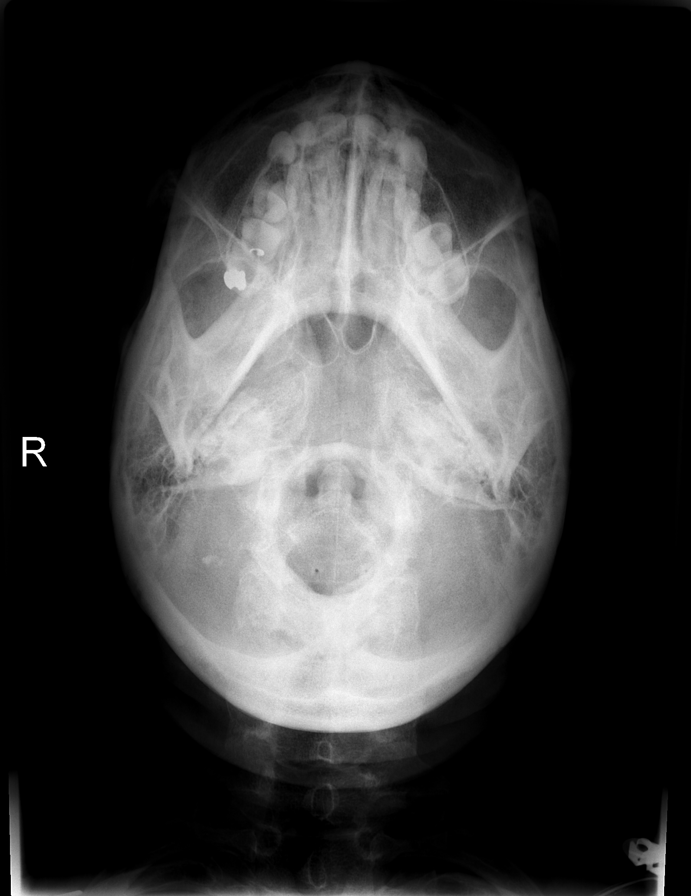

[w swimmers view]
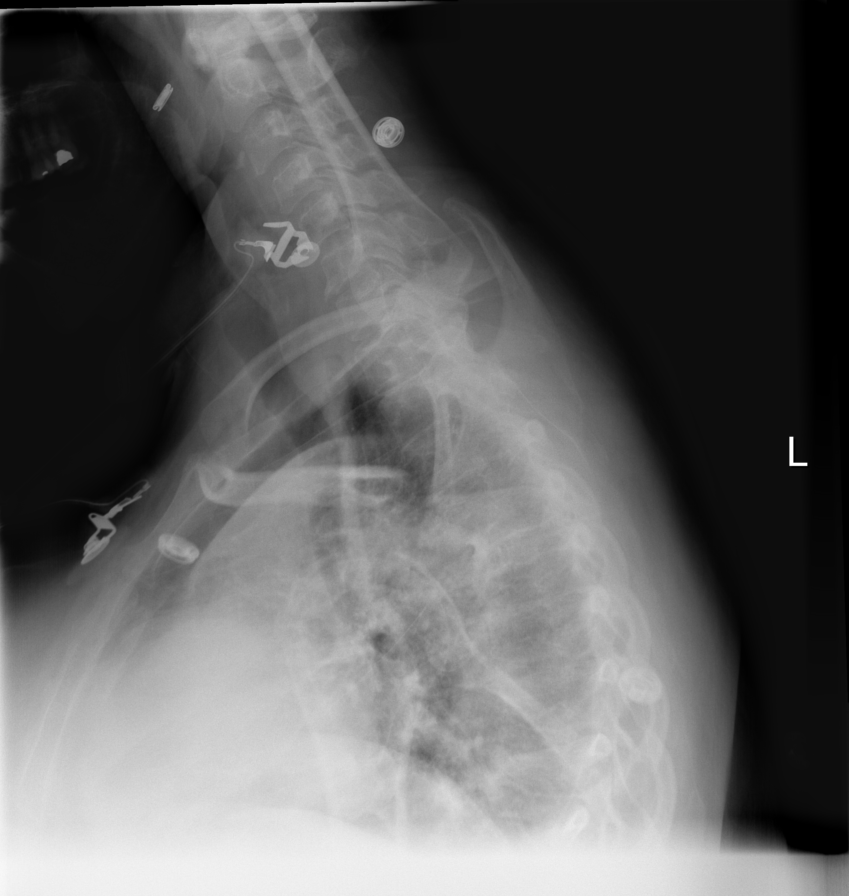

[7 of 7 positions shown; findings below may reference images not displayed]

FINDINGS: The cervical spine is visualized from the skull base through the cervicothoracic junction.  Lateral image demonstrates minimal retrolisthesis of C2 on C3, likely degenerative in nature.  This is not appreciated on the swimmer?s view.  There is focal degenerative disk disease at C6-7 with anterior osteophytes and small posterior osteophytes.  The neural foramina appear patent bilaterally on the oblique images.   There is no evidence for acute fracture or subluxation.
IMPRESSION: 1.  Mild degenerative disk disease C6-7. 
 2.  No evidence for acute trauma of the cervical spine.

## 2006-12-30 IMAGING — CR DG CHEST 1V PORT
1 series · 1 of 1 positions shown · non-contrast
Comparison: None.

CLINICAL DATA: Chest pain. 
 PORTABLE CHEST - 1 VIEW - 08/09/05:

[view not recorded]
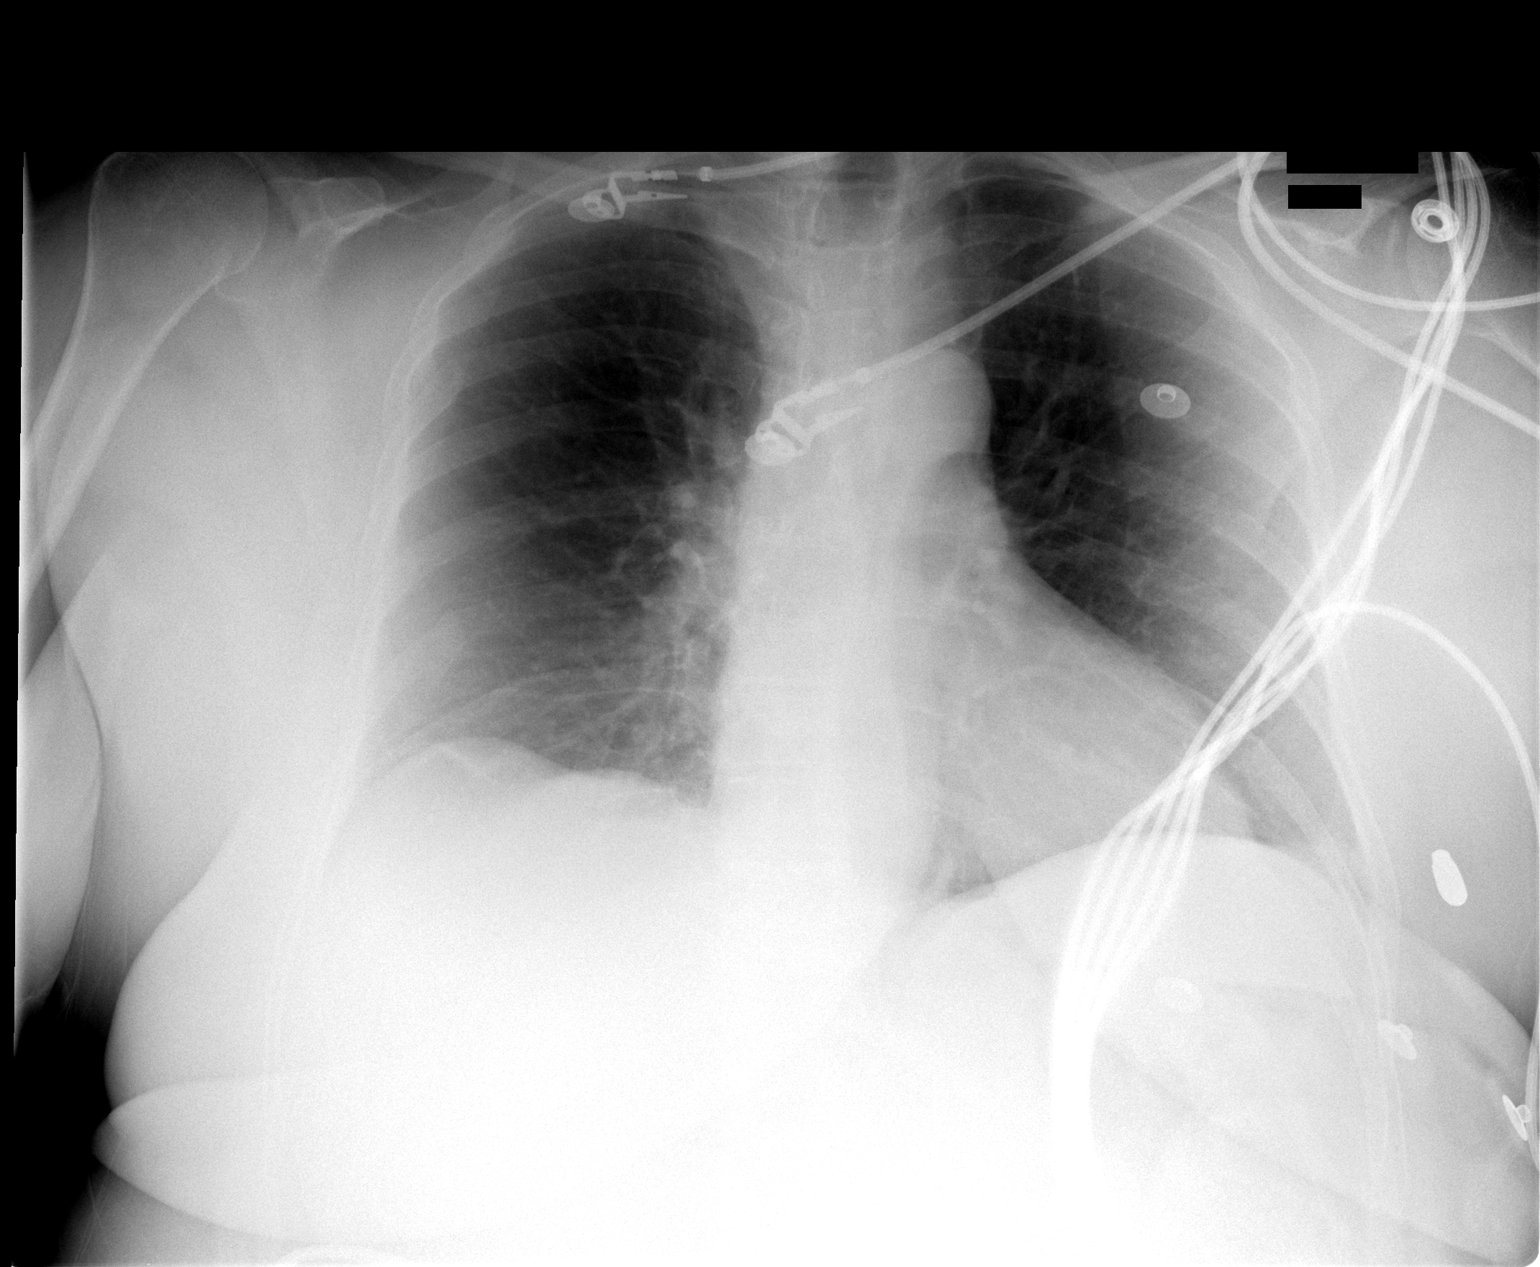

[1 of 1 positions shown; findings below may reference images not displayed]

FINDINGS: AP film at 2522 hours shows low volumes.  The cardiopericardial silhouette is within normal limits for size.   There is no evidence for pulmonary edema or focal lung consolidation.  Probable nonacute right-sided rib fractures.  Telemetry leads overlie the chest.
IMPRESSION: Low lung volumes without edema or focal infiltrate.

## 2006-12-30 IMAGING — CR DG CHEST 2V
2 series · 2 of 2 positions shown · non-contrast
Comparison: CT of the same day.

CLINICAL DATA: 65-year-old female with chest pain
 CHEST - 2 VIEW:

[w chest pa]
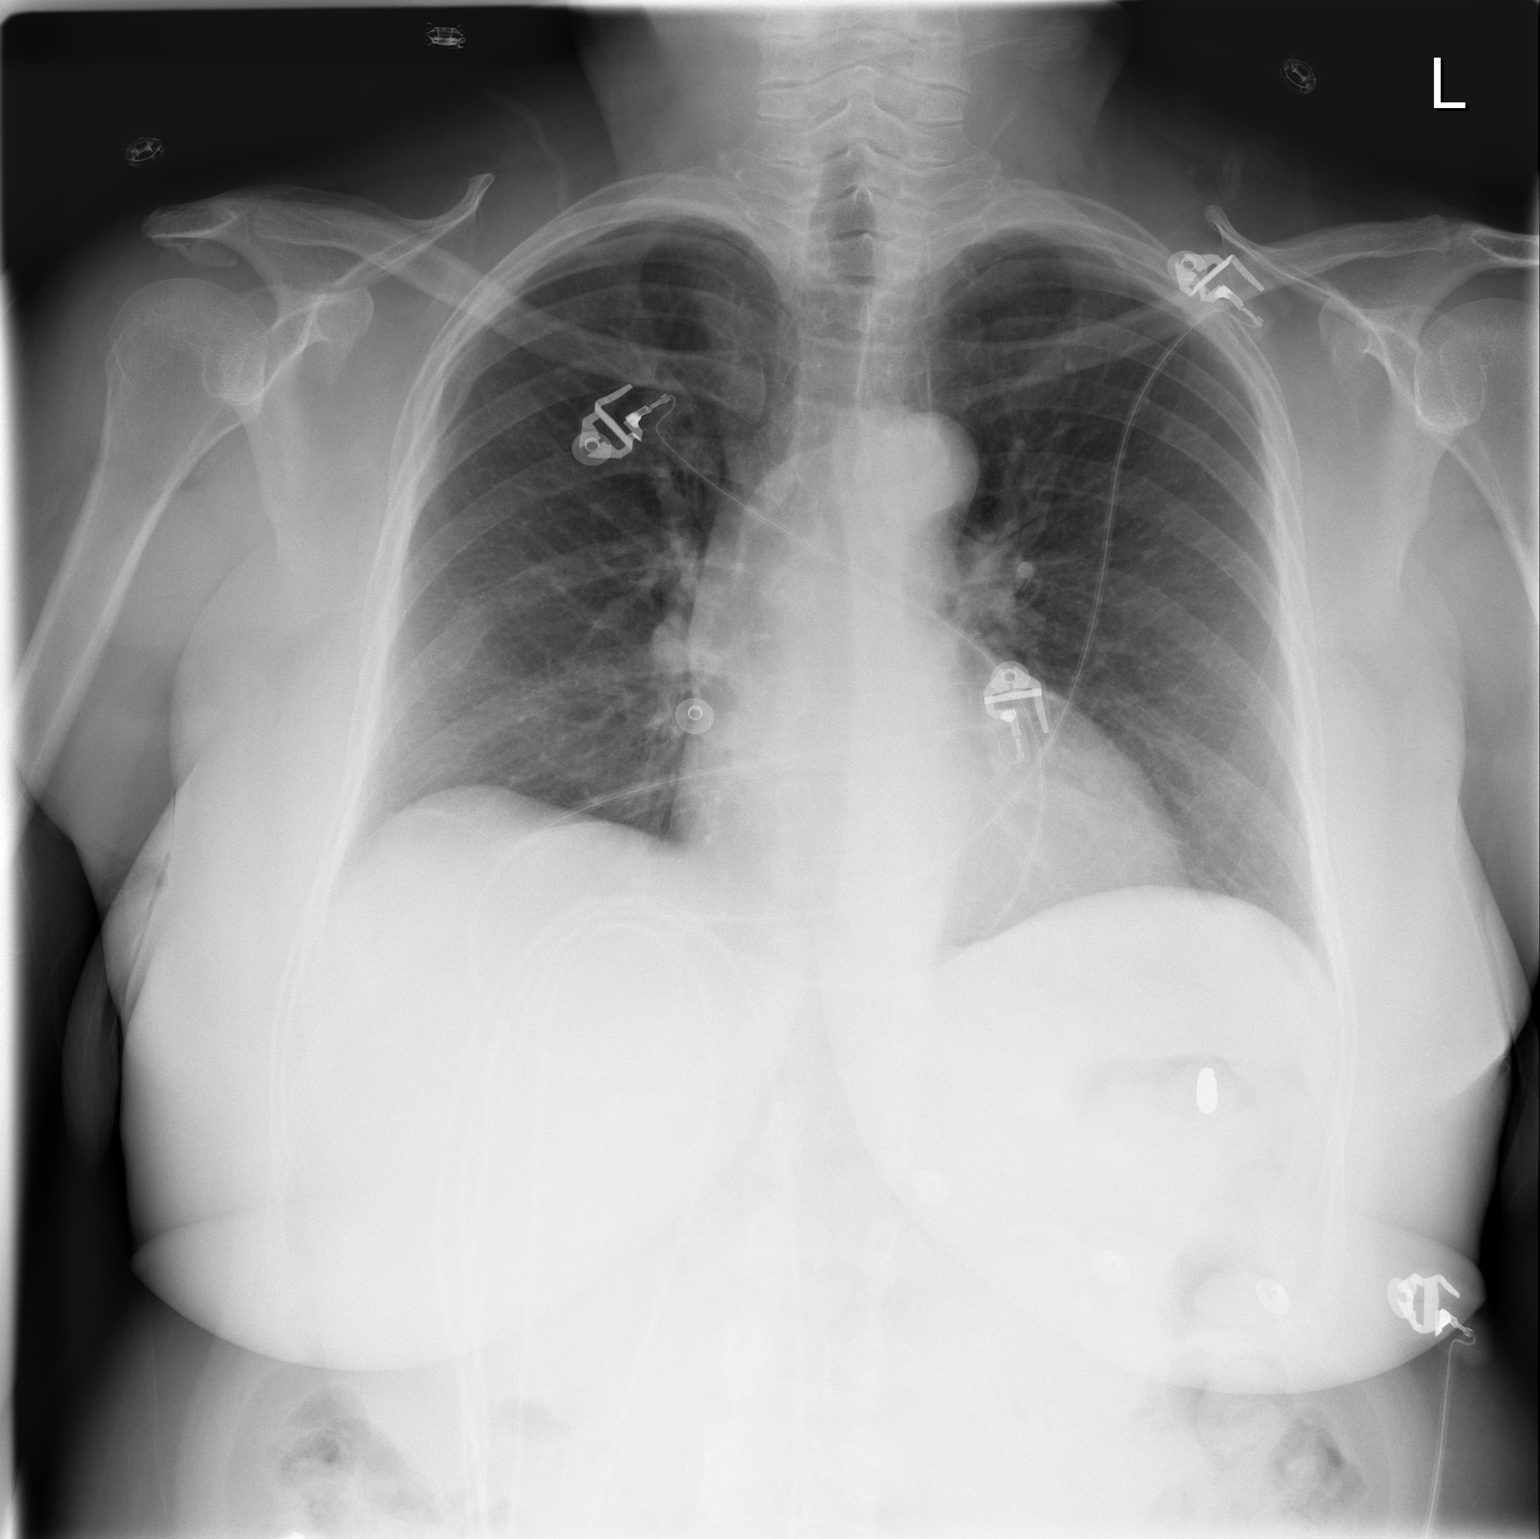

[w chest lat]
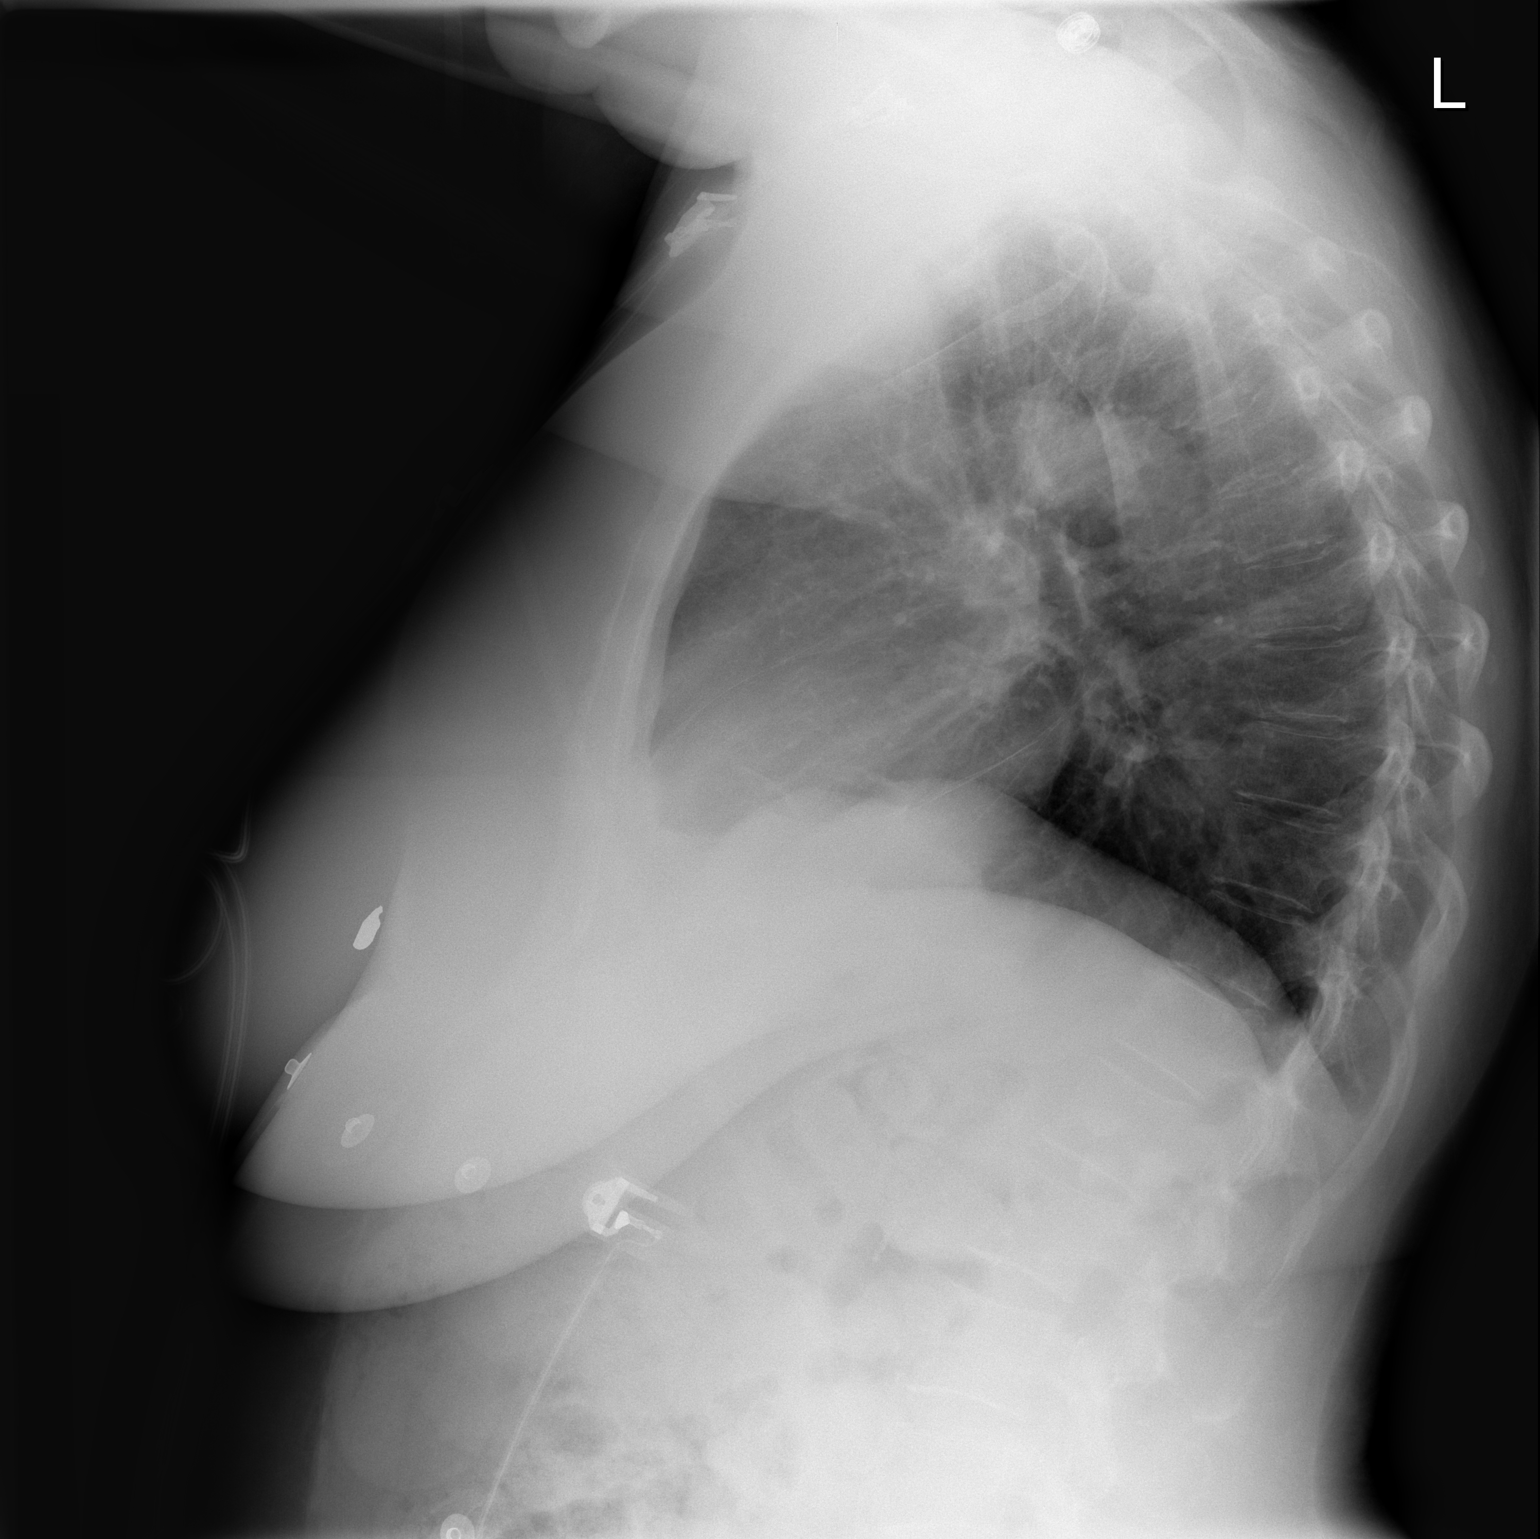

[2 of 2 positions shown; findings below may reference images not displayed]

FINDINGS: Cardiopericardial silhouette is within normal limits for size.  7 mm nodule in the right perihilar region is redemonstrated.  No other focal nodule, mass or airspace disease is seen.  Healed right-sided rib fractures are redemonstrated.  Visualized soft tissues and the bony thorax are otherwise unremarkable.
IMPRESSION: 1.  No acute cardiopulmonary disease. 
 2.  Right perihilar nodule. 
 3.  Healed right-sided rib fracture.

## 2007-01-26 ENCOUNTER — Encounter: Admission: RE | Admit: 2007-01-26 | Discharge: 2007-01-26 | Payer: Self-pay | Admitting: Thoracic Surgery

## 2007-01-26 ENCOUNTER — Ambulatory Visit: Payer: Self-pay | Admitting: Thoracic Surgery

## 2007-02-02 HISTORY — PX: OTHER SURGICAL HISTORY: SHX169

## 2007-02-11 IMAGING — CR DG HIP W/ PELVIS BILAT
5 series · 5 of 5 positions shown · non-contrast
Comparison: none

CLINICAL DATA: Chronic hip pain, left worse than right, increasing recently. 
 AP PELVIS WITH BILATERAL HIPS ? 5 VIEW:
 Joint spaces appear normal bilaterally.  No evidence of degenerative arthritis.   No sign of AVN or other focal process.   The SI joint and the symphysis pubis appear normal.

[t pelvis a.p.]
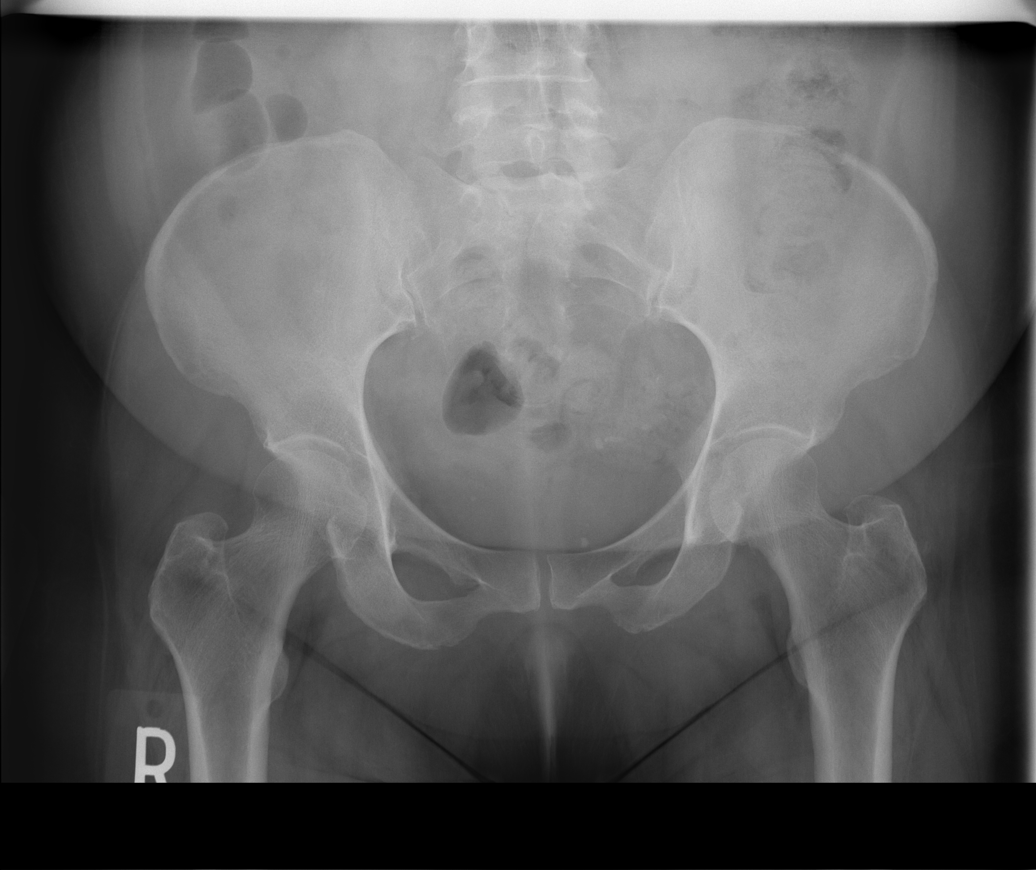

[t hip ap left]
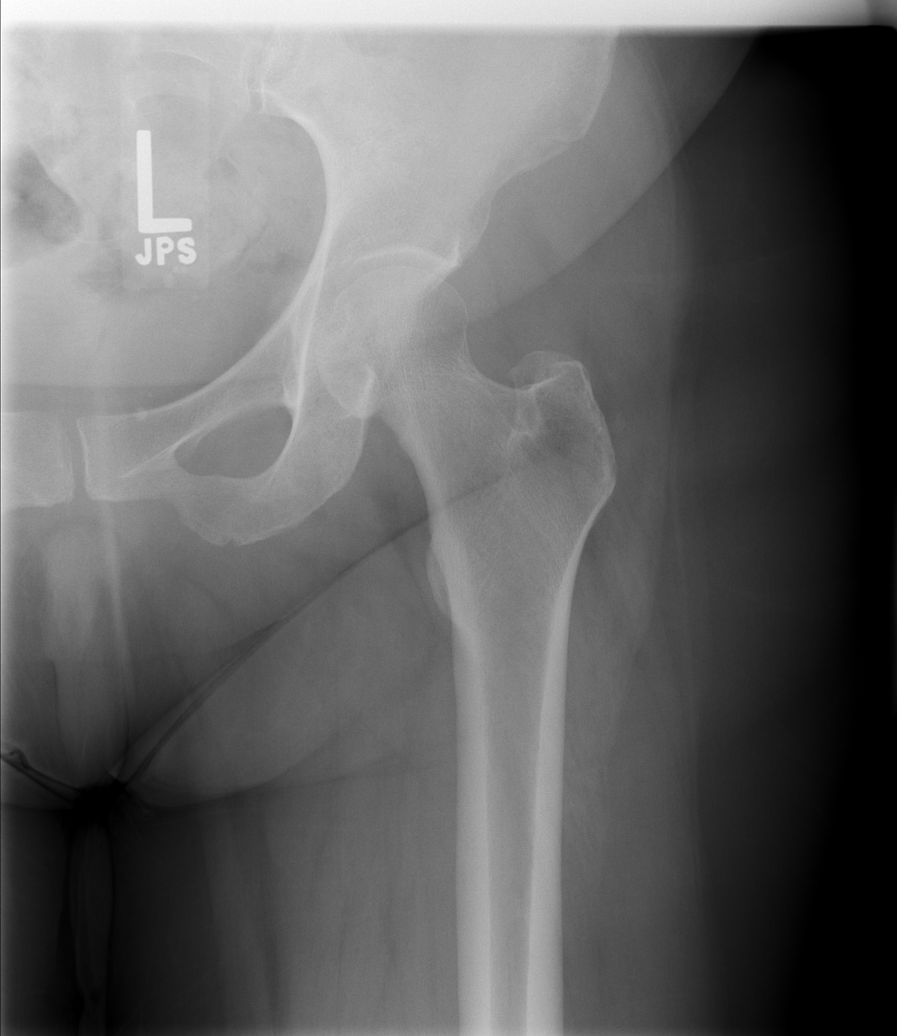

[t hip frog leg left]
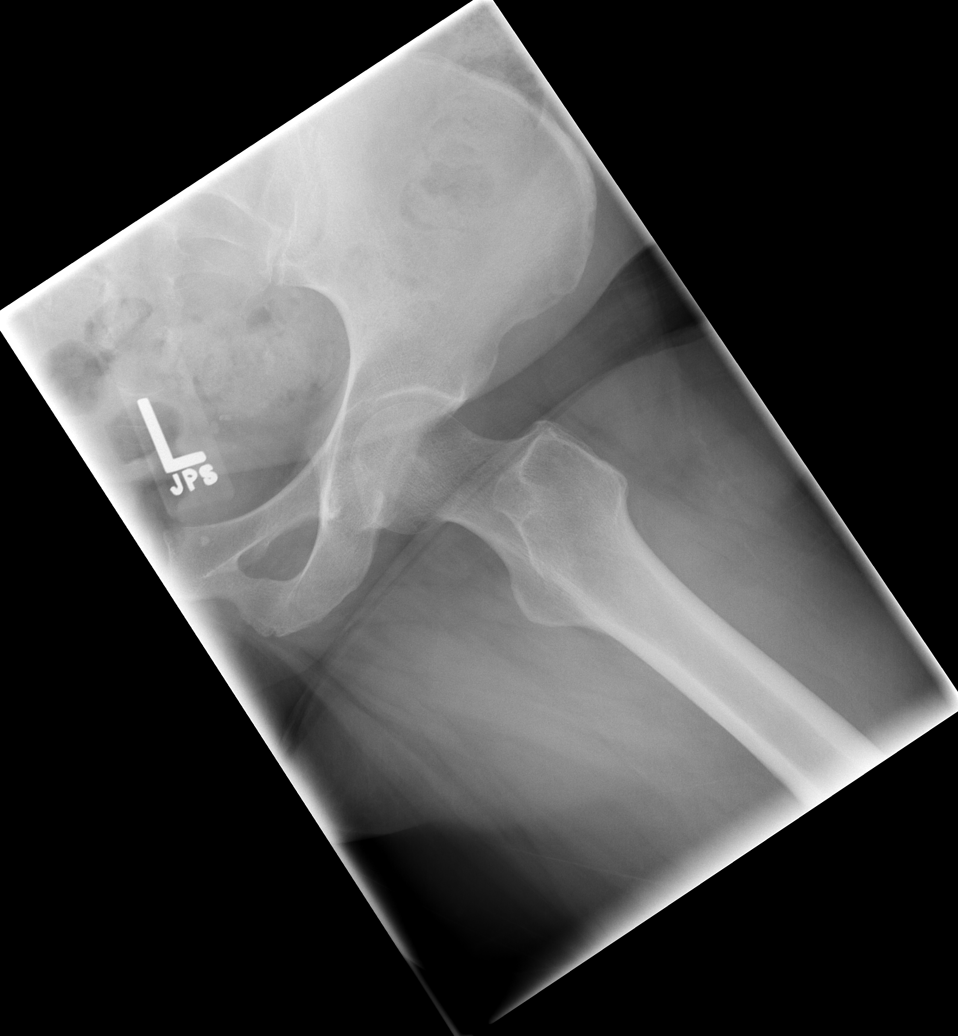

[t hip ap right]
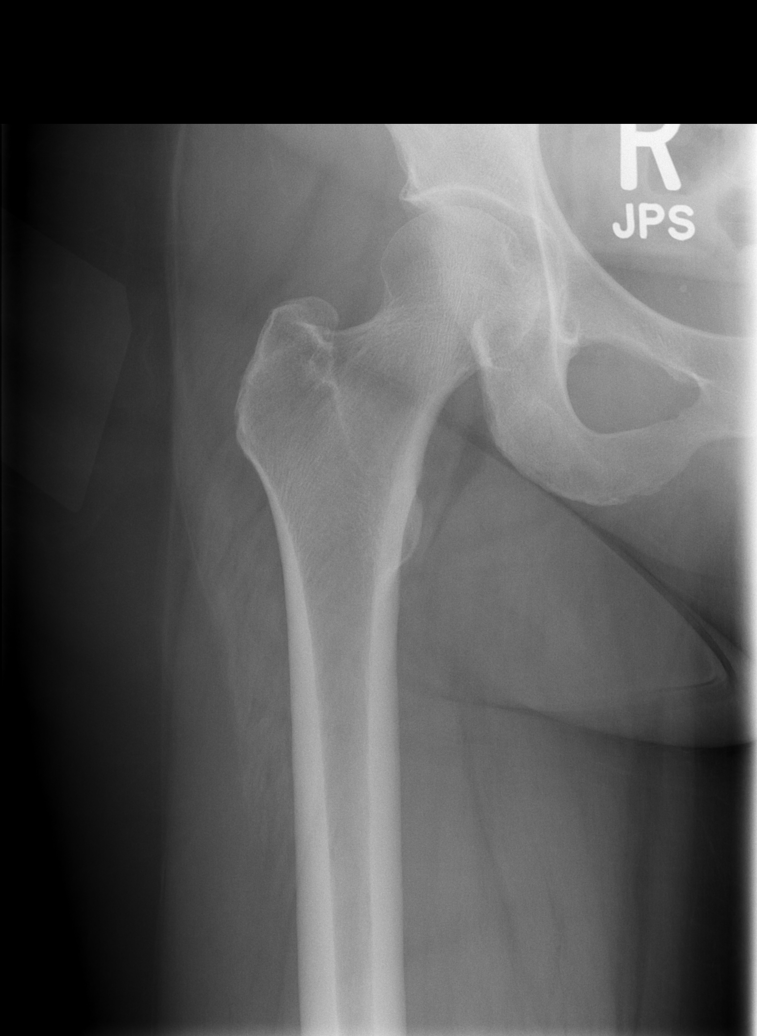

[t hip frog leg right]
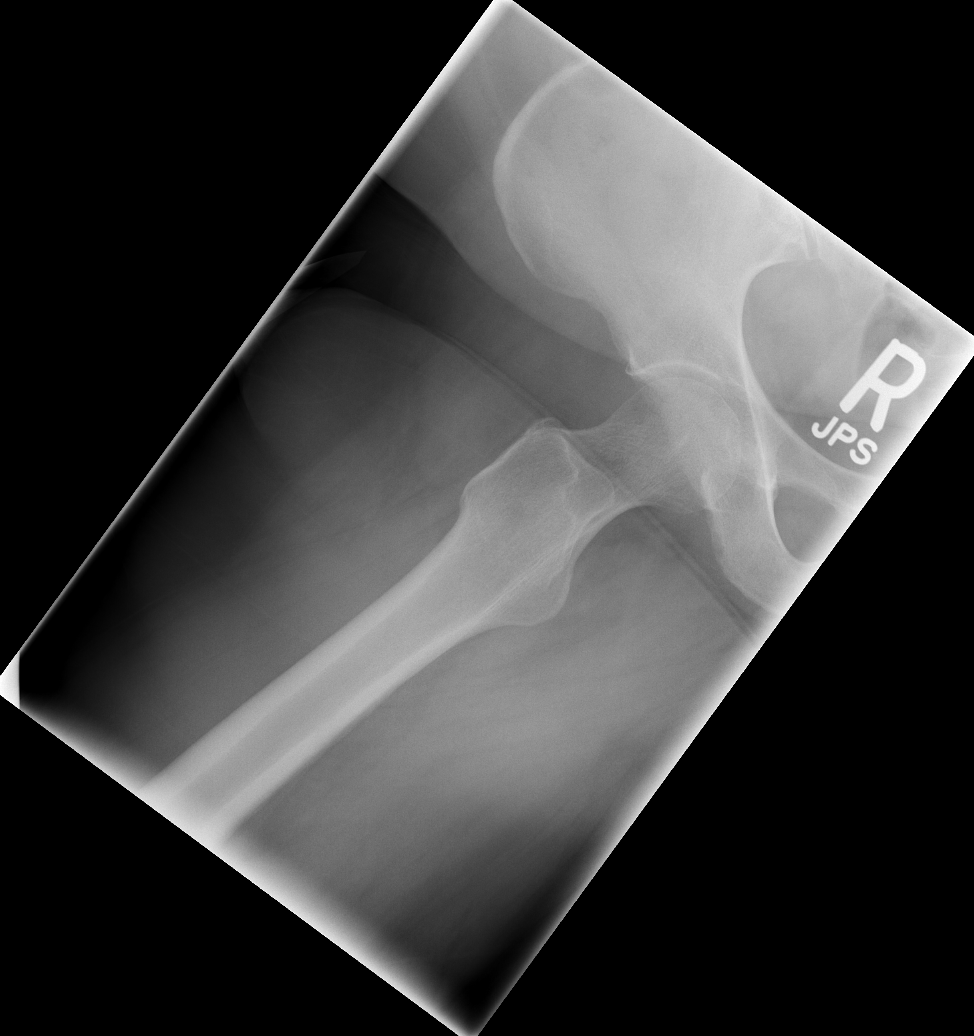

[5 of 5 positions shown; findings below may reference images not displayed]

IMPRESSION: Normal examination.

## 2007-02-17 ENCOUNTER — Inpatient Hospital Stay (HOSPITAL_COMMUNITY): Admission: RE | Admit: 2007-02-17 | Discharge: 2007-02-26 | Payer: Self-pay | Admitting: Thoracic Surgery

## 2007-02-18 ENCOUNTER — Ambulatory Visit: Payer: Self-pay | Admitting: Thoracic Surgery

## 2007-03-03 ENCOUNTER — Encounter: Admission: RE | Admit: 2007-03-03 | Discharge: 2007-03-03 | Payer: Self-pay | Admitting: Thoracic Surgery

## 2007-03-03 ENCOUNTER — Ambulatory Visit: Payer: Self-pay | Admitting: Thoracic Surgery

## 2007-03-09 ENCOUNTER — Ambulatory Visit: Payer: Self-pay | Admitting: Internal Medicine

## 2007-03-14 ENCOUNTER — Encounter: Payer: Self-pay | Admitting: Internal Medicine

## 2007-03-14 ENCOUNTER — Ambulatory Visit: Payer: Self-pay | Admitting: Internal Medicine

## 2007-03-16 ENCOUNTER — Encounter: Admission: RE | Admit: 2007-03-16 | Discharge: 2007-03-16 | Payer: Self-pay | Admitting: Thoracic Surgery

## 2007-03-16 ENCOUNTER — Ambulatory Visit: Payer: Self-pay | Admitting: Thoracic Surgery

## 2007-03-28 ENCOUNTER — Ambulatory Visit: Payer: Self-pay | Admitting: Internal Medicine

## 2007-03-28 LAB — CONVERTED CEMR LAB
Basophils Absolute: 0.1 10*3/uL (ref 0.0–0.1)
Basophils Relative: 0.8 % (ref 0.0–1.0)
Eosinophils Absolute: 0.4 10*3/uL (ref 0.0–0.6)
Eosinophils Relative: 5.1 % — ABNORMAL HIGH (ref 0.0–5.0)
HCT: 31.5 % — ABNORMAL LOW (ref 36.0–46.0)
Hemoglobin: 10.1 g/dL — ABNORMAL LOW (ref 12.0–15.0)
Lymphocytes Relative: 30.4 % (ref 12.0–46.0)
MCHC: 32.2 g/dL (ref 30.0–36.0)
MCV: 74.2 fL — ABNORMAL LOW (ref 78.0–100.0)
Monocytes Absolute: 0.7 10*3/uL (ref 0.2–0.7)
Monocytes Relative: 9.1 % (ref 3.0–11.0)
Neutro Abs: 4.4 10*3/uL (ref 1.4–7.7)
Neutrophils Relative %: 54.6 % (ref 43.0–77.0)
Platelets: 266 10*3/uL (ref 150–400)
RBC: 4.25 M/uL (ref 3.87–5.11)
RDW: 13.6 % (ref 11.5–14.6)
WBC: 8.1 10*3/uL (ref 4.5–10.5)

## 2007-03-29 ENCOUNTER — Ambulatory Visit: Payer: Self-pay | Admitting: Internal Medicine

## 2007-04-06 ENCOUNTER — Ambulatory Visit: Payer: Self-pay | Admitting: Thoracic Surgery

## 2007-04-22 ENCOUNTER — Ambulatory Visit: Payer: Self-pay | Admitting: Internal Medicine

## 2007-04-23 IMAGING — MG MM SCREEN MAMMOGRAM BILATERAL
4 series · 4 of 4 positions shown · non-contrast
Comparison: none

DG SCREEN MAMMOGRAM BILATERAL
Bilateral CC and MLO view(s) were taken.

SCREENING MAMMOGRAM:
There is a fibroglandular pattern.  No masses or malignant type calcifications are identified.  
Compared with prior studies.

[R CC]
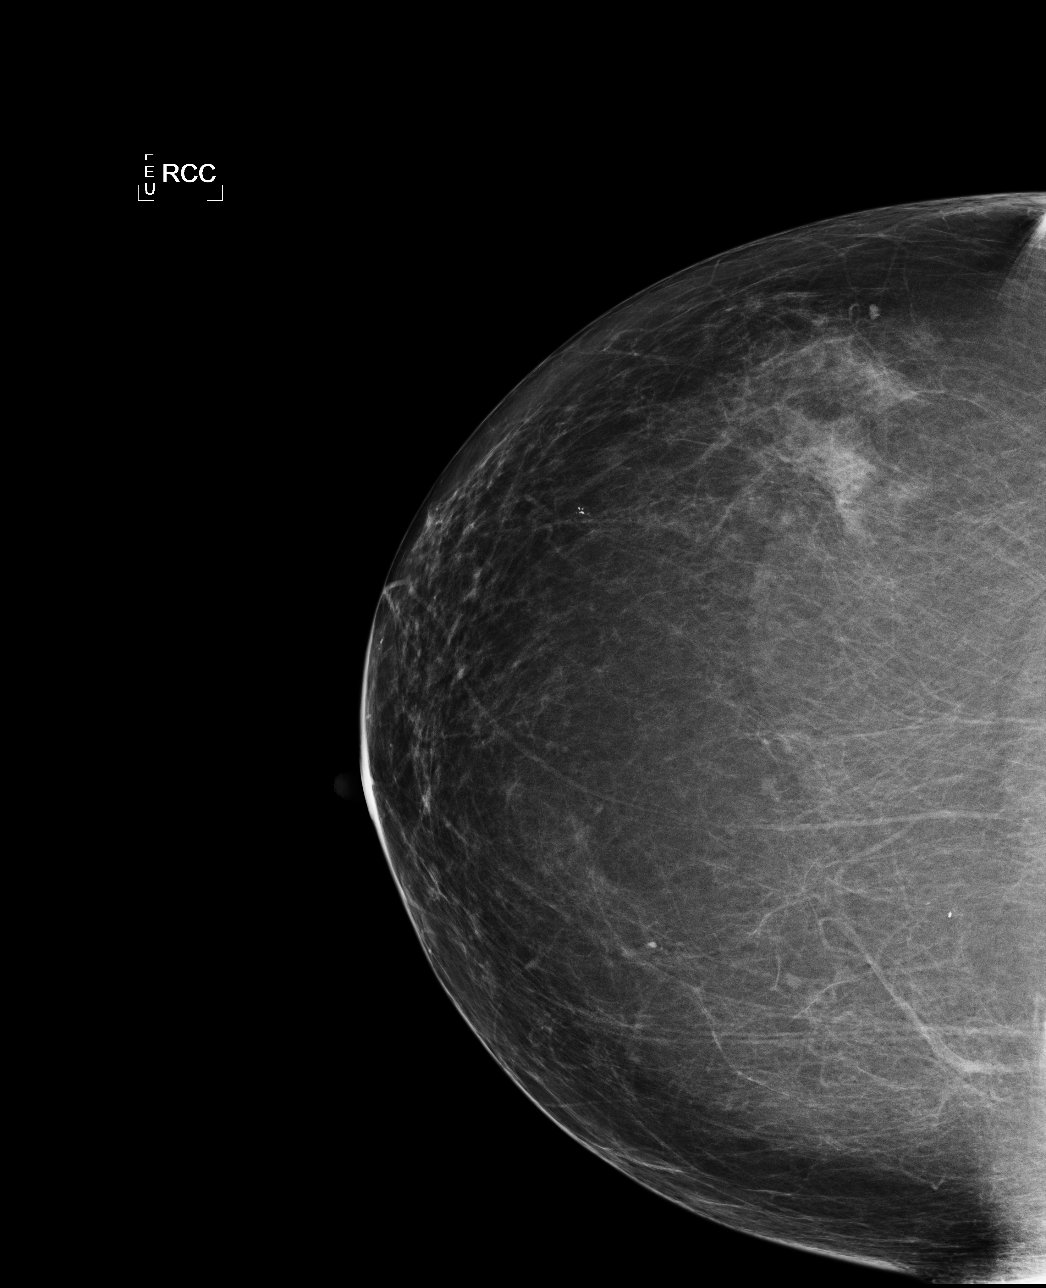

[L CC]
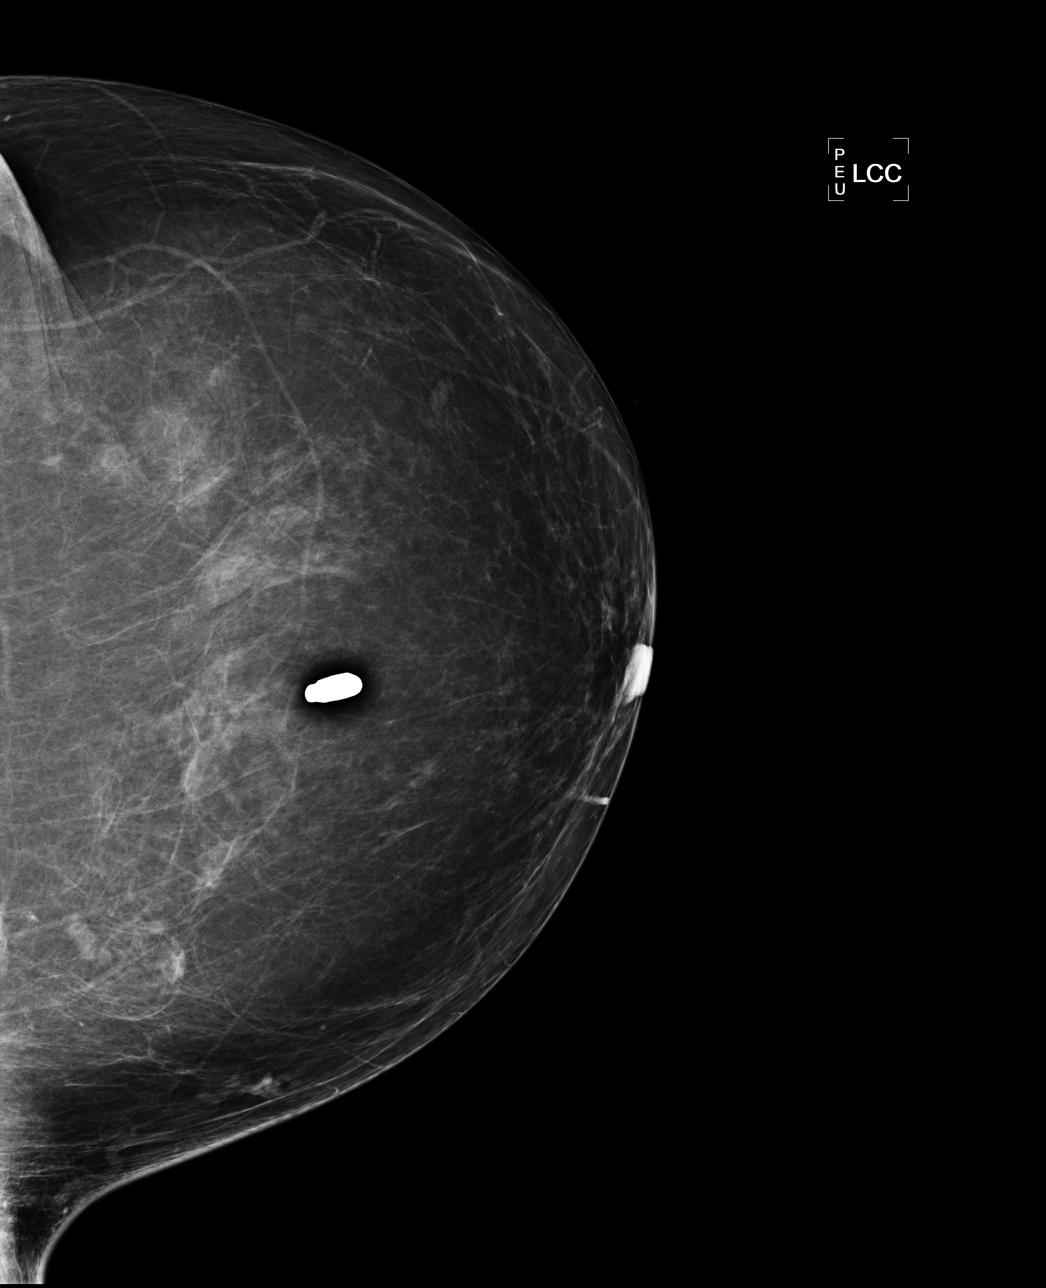

[L MLO]
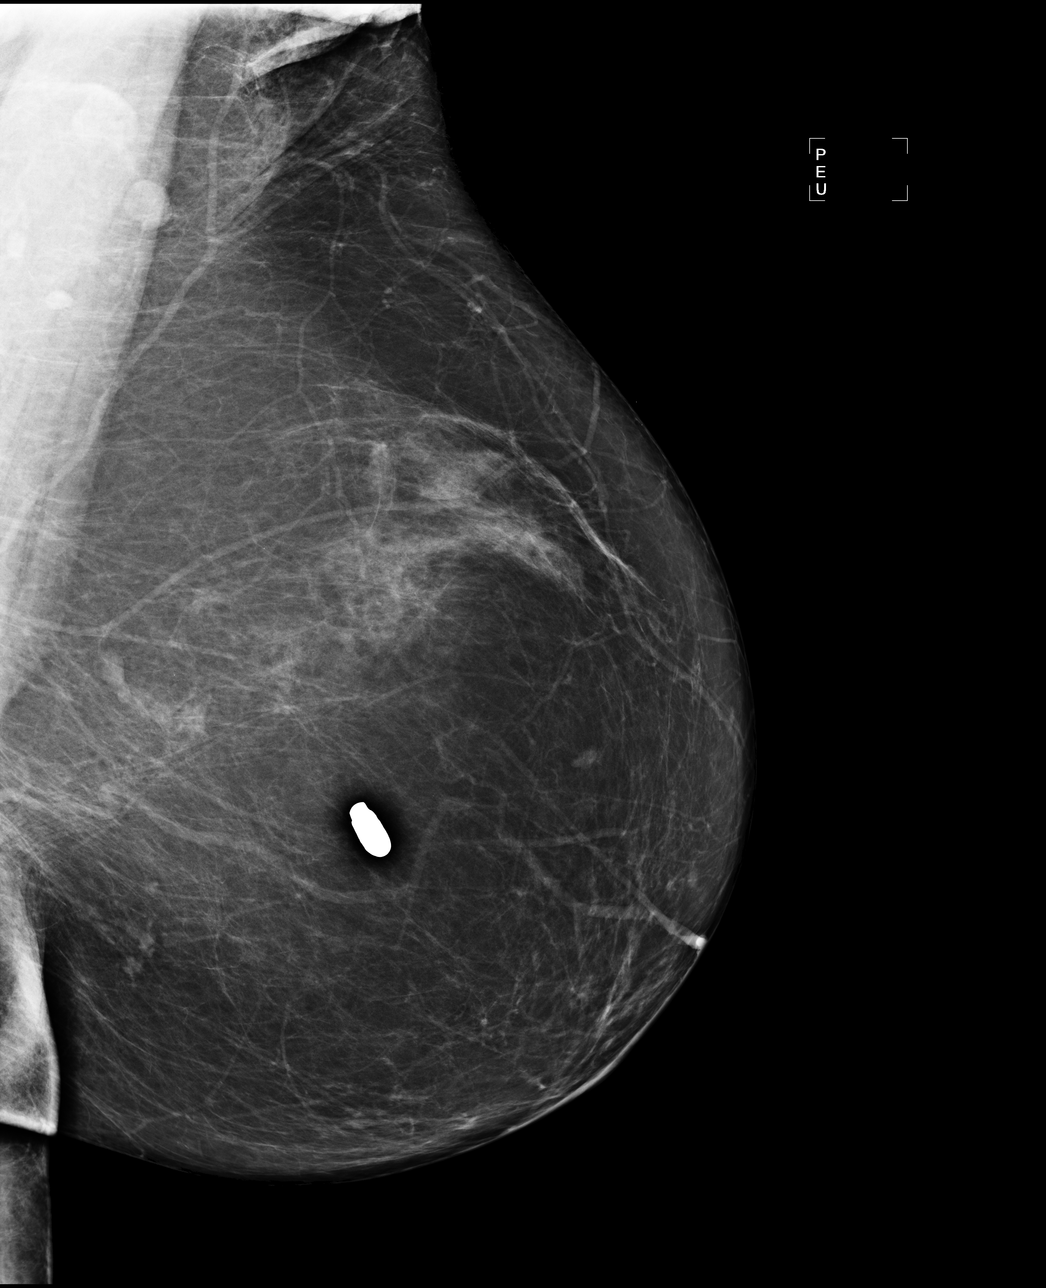

[R MLO]
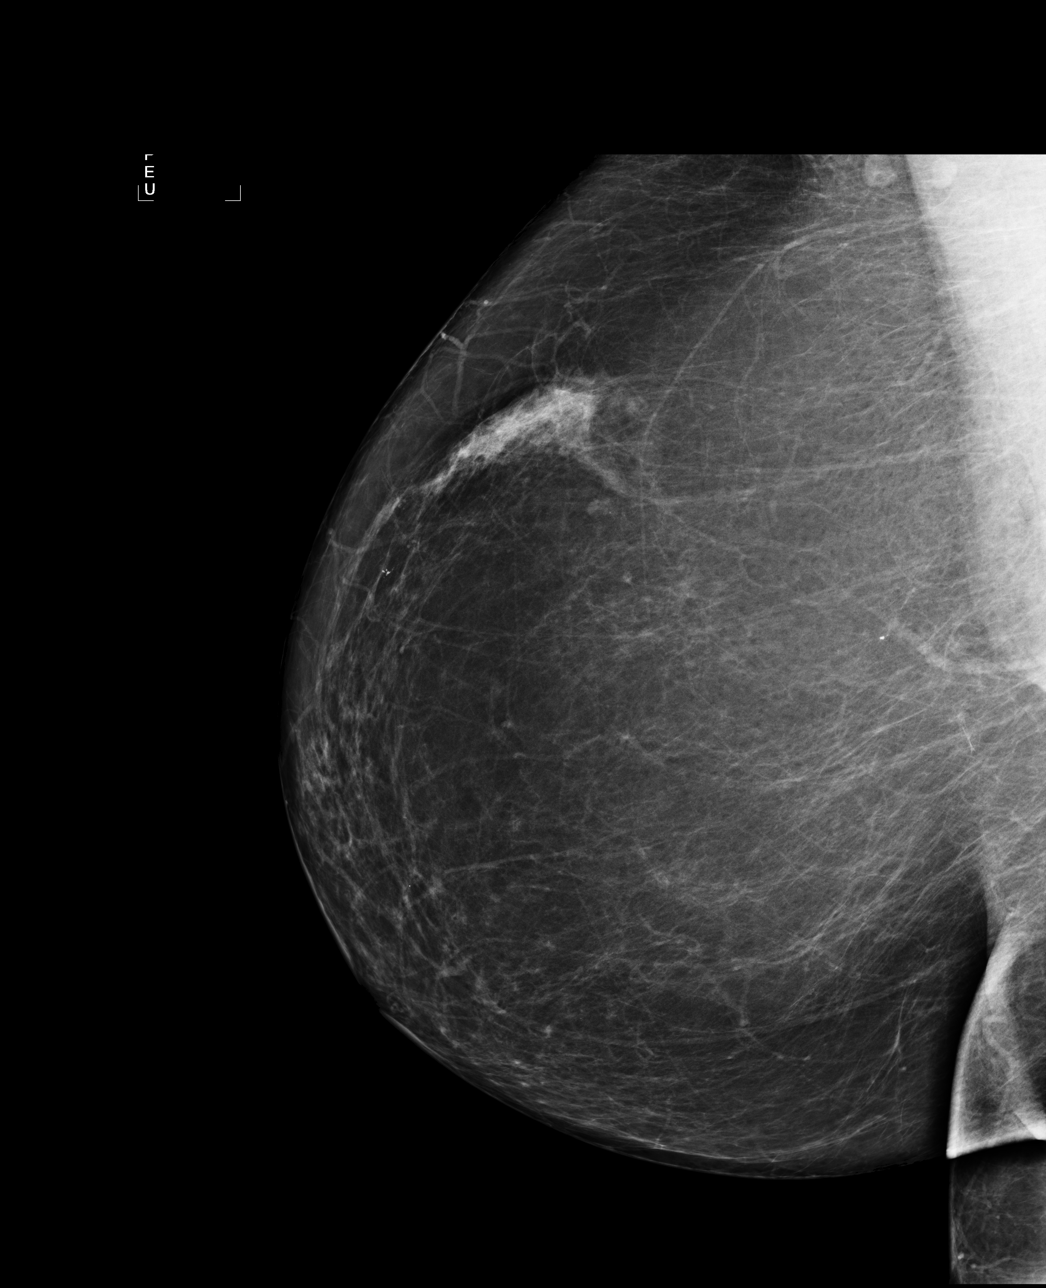

[4 of 4 positions shown; findings below may reference images not displayed]

IMPRESSION: No specific mammographic evidence of malignancy.  Next screening mammogram is recommended in one 
year.

ASSESSMENT: Negative - BI-RADS 1

Screening mammogram in 1 year.
ANALYZED BY COMPUTER AIDED DETECTION. , THIS PROCEDURE WAS A DIGITAL MAMMOGRAM.

## 2007-05-02 ENCOUNTER — Ambulatory Visit: Payer: Self-pay | Admitting: Family Medicine

## 2007-05-05 HISTORY — PX: CHOLECYSTECTOMY: SHX55

## 2007-05-09 ENCOUNTER — Inpatient Hospital Stay (HOSPITAL_COMMUNITY): Admission: EM | Admit: 2007-05-09 | Discharge: 2007-05-13 | Payer: Self-pay | Admitting: Emergency Medicine

## 2007-05-10 ENCOUNTER — Encounter (INDEPENDENT_AMBULATORY_CARE_PROVIDER_SITE_OTHER): Payer: Self-pay | Admitting: General Surgery

## 2007-05-13 ENCOUNTER — Ambulatory Visit: Payer: Self-pay | Admitting: Internal Medicine

## 2007-05-18 ENCOUNTER — Ambulatory Visit: Payer: Self-pay | Admitting: Thoracic Surgery

## 2007-05-18 ENCOUNTER — Encounter: Admission: RE | Admit: 2007-05-18 | Discharge: 2007-05-18 | Payer: Self-pay | Admitting: Thoracic Surgery

## 2007-05-23 ENCOUNTER — Encounter (INDEPENDENT_AMBULATORY_CARE_PROVIDER_SITE_OTHER): Payer: Self-pay | Admitting: Nurse Practitioner

## 2007-05-23 ENCOUNTER — Ambulatory Visit: Payer: Self-pay | Admitting: Family Medicine

## 2007-05-23 LAB — CONVERTED CEMR LAB
ALT: 11 units/L (ref 0–35)
AST: 15 units/L (ref 0–37)
Albumin: 4.5 g/dL (ref 3.5–5.2)
Alkaline Phosphatase: 64 units/L (ref 39–117)
BUN: 11 mg/dL (ref 6–23)
CO2: 24 meq/L (ref 19–32)
Calcium: 9.6 mg/dL (ref 8.4–10.5)
Chloride: 108 meq/L (ref 96–112)
Cholesterol: 173 mg/dL (ref 0–200)
Creatinine, Ser: 0.77 mg/dL (ref 0.40–1.20)
Glucose, Bld: 118 mg/dL — ABNORMAL HIGH (ref 70–99)
HDL: 42 mg/dL (ref >39)
LDL Cholesterol: 87 mg/dL (ref 0–99)
Potassium: 4.2 meq/L (ref 3.5–5.3)
Sodium: 145 meq/L (ref 135–145)
Total Bilirubin: 0.3 mg/dL (ref 0.3–1.2)
Total CHOL/HDL Ratio: 4.1
Total Protein: 7.9 g/dL (ref 6.0–8.3)
Triglycerides: 222 mg/dL — ABNORMAL HIGH (ref <150)
VLDL: 44 mg/dL — ABNORMAL HIGH (ref 0–40)

## 2007-05-31 ENCOUNTER — Ambulatory Visit: Payer: Self-pay | Admitting: Internal Medicine

## 2007-06-03 ENCOUNTER — Ambulatory Visit (HOSPITAL_COMMUNITY): Admission: RE | Admit: 2007-06-03 | Discharge: 2007-06-03 | Payer: Self-pay | Admitting: Internal Medicine

## 2007-06-09 ENCOUNTER — Encounter: Admission: RE | Admit: 2007-06-09 | Discharge: 2007-06-09 | Payer: Self-pay | Admitting: Family Medicine

## 2007-06-10 ENCOUNTER — Ambulatory Visit: Payer: Self-pay | Admitting: Family Medicine

## 2007-06-23 ENCOUNTER — Ambulatory Visit: Payer: Self-pay | Admitting: Internal Medicine

## 2007-06-29 ENCOUNTER — Ambulatory Visit: Payer: Self-pay | Admitting: Thoracic Surgery

## 2007-06-29 ENCOUNTER — Encounter: Admission: RE | Admit: 2007-06-29 | Discharge: 2007-06-29 | Payer: Self-pay | Admitting: Thoracic Surgery

## 2007-07-25 ENCOUNTER — Ambulatory Visit: Payer: Self-pay | Admitting: Internal Medicine

## 2007-07-26 ENCOUNTER — Ambulatory Visit: Payer: Self-pay | Admitting: Internal Medicine

## 2007-07-26 ENCOUNTER — Encounter (INDEPENDENT_AMBULATORY_CARE_PROVIDER_SITE_OTHER): Payer: Self-pay | Admitting: Nurse Practitioner

## 2007-07-26 LAB — CONVERTED CEMR LAB
Cholesterol: 152 mg/dL (ref 0–200)
HDL: 48 mg/dL (ref >39)
LDL Cholesterol: 70 mg/dL (ref 0–99)
Total CHOL/HDL Ratio: 3.2
Triglycerides: 172 mg/dL — ABNORMAL HIGH (ref <150)
VLDL: 34 mg/dL (ref 0–40)

## 2007-07-27 ENCOUNTER — Ambulatory Visit: Payer: Self-pay | Admitting: Internal Medicine

## 2007-08-05 ENCOUNTER — Ambulatory Visit: Payer: Self-pay | Admitting: Internal Medicine

## 2007-08-11 ENCOUNTER — Ambulatory Visit: Payer: Self-pay | Admitting: Internal Medicine

## 2007-08-20 ENCOUNTER — Emergency Department (HOSPITAL_COMMUNITY): Admission: EM | Admit: 2007-08-20 | Discharge: 2007-08-20 | Payer: Self-pay | Admitting: Emergency Medicine

## 2007-08-24 ENCOUNTER — Ambulatory Visit: Payer: Self-pay | Admitting: Thoracic Surgery

## 2007-08-24 ENCOUNTER — Encounter: Admission: RE | Admit: 2007-08-24 | Discharge: 2007-08-24 | Payer: Self-pay | Admitting: Thoracic Surgery

## 2007-09-06 IMAGING — CT CT CHEST W/O CM
2 of 4 series · 15 of 36 positions shown, 18 images · IV contrast (agent unspecified)
Comparison: 08/09/05 and 07/03/04.

CLINICAL DATA: Pulmonary nodule, shortness of breath. 
CHEST CT WITHOUT CONTRAST:
TECHNIQUE: Multidetector CT imaging of the chest was performed following the standard protocol without IV contrast.

[Series 2: routine chest 5.0 st · axial · 0.59mm/px · z∈[-263,-23]mm · 12 of 56 slices shown, 15 images]
[im 4/56  mediastinal]
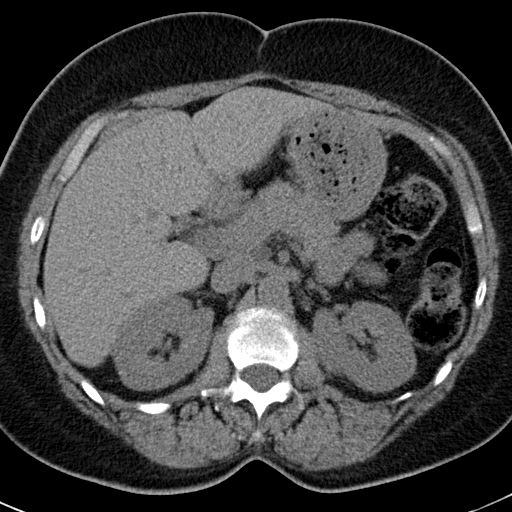
[im 4/56  lung]
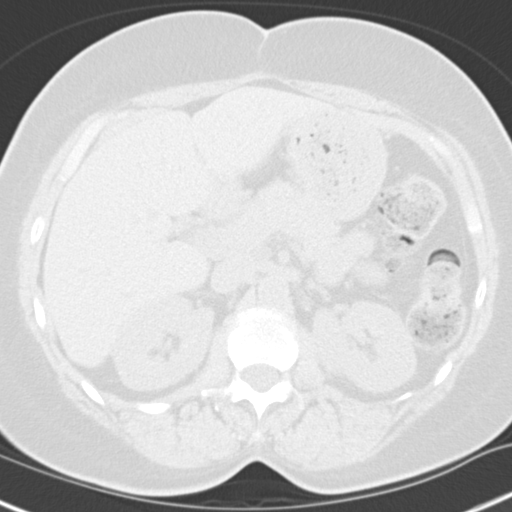
[im 8/56  lung]
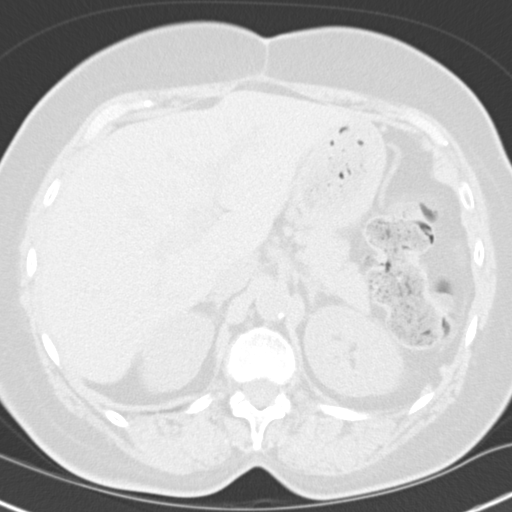
[im 12/56  lung]
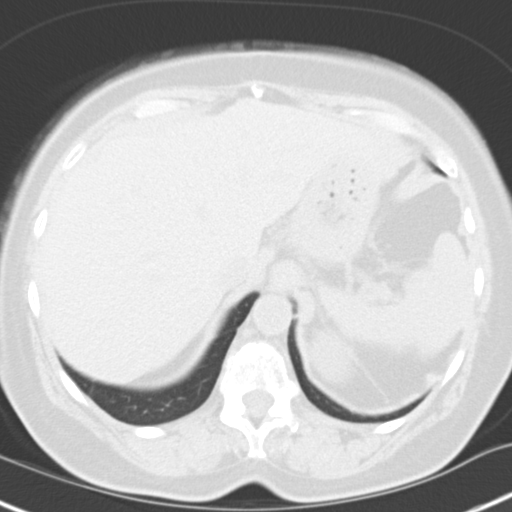
[im 16/56  lung]
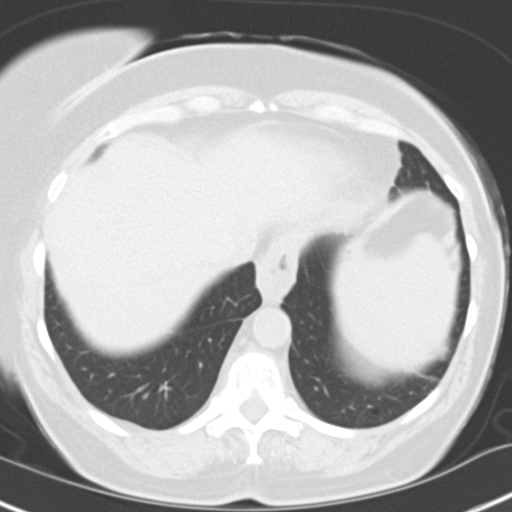
[im 20/56  mediastinal]
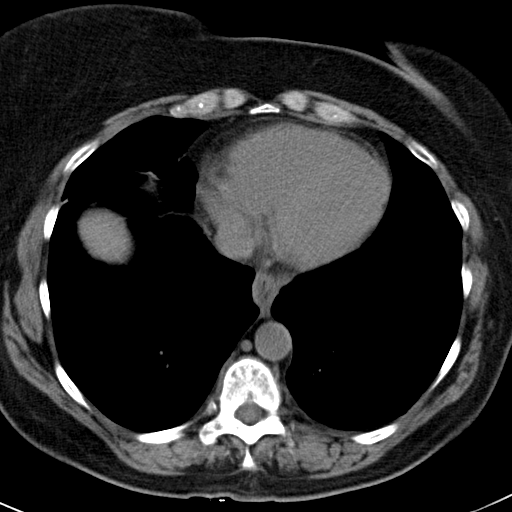
[im 20/56  lung]
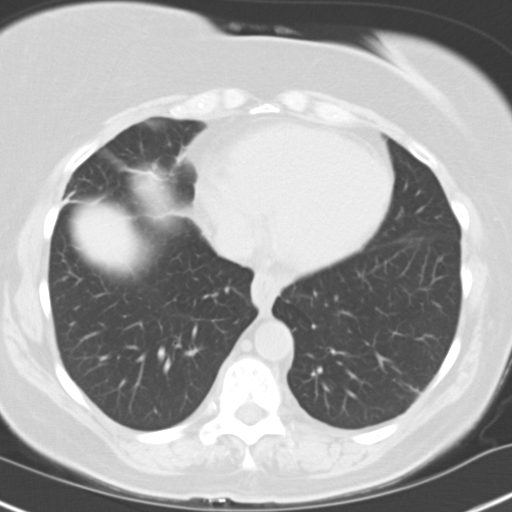
[im 24/56  lung]
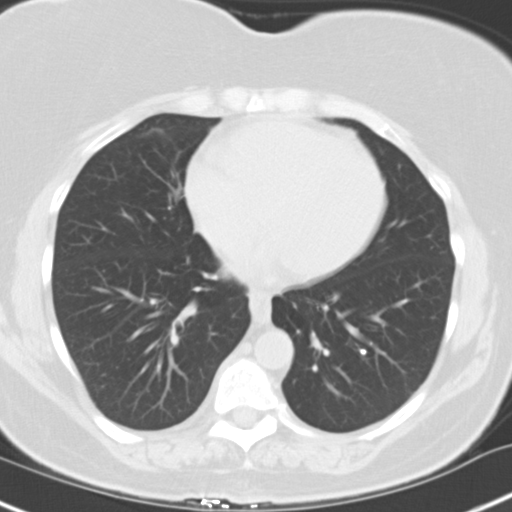
[im 32/56  lung]
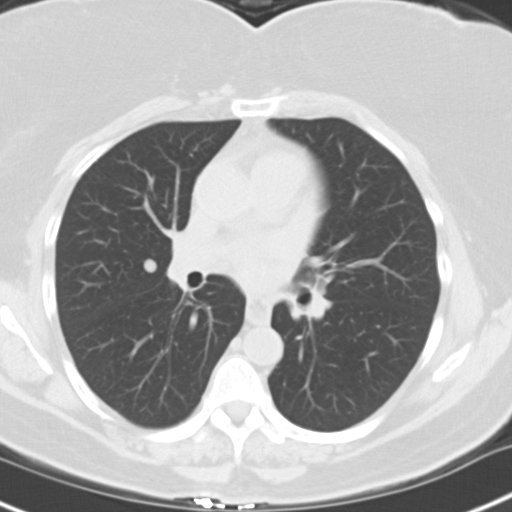
[im 36/56  lung]
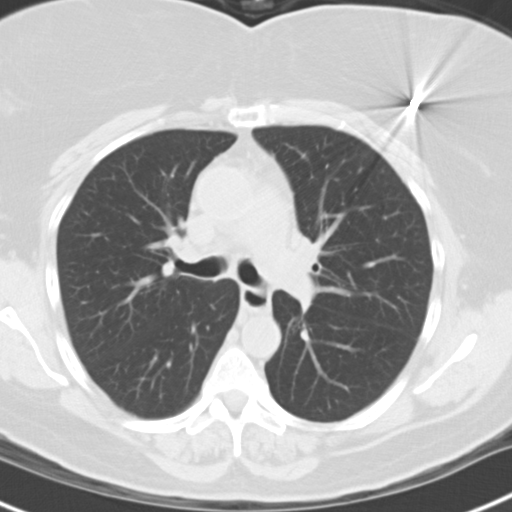
[im 40/56  mediastinal]
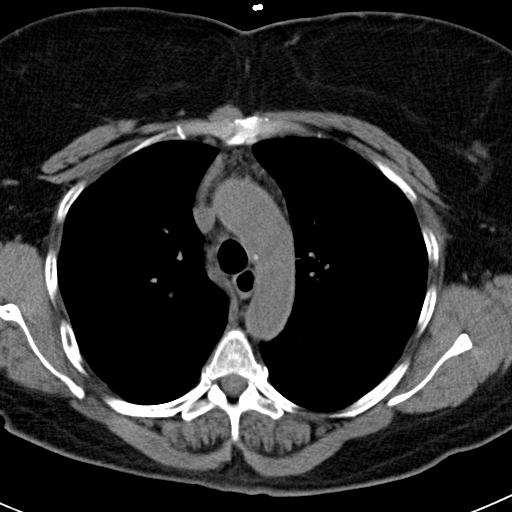
[im 40/56  lung]
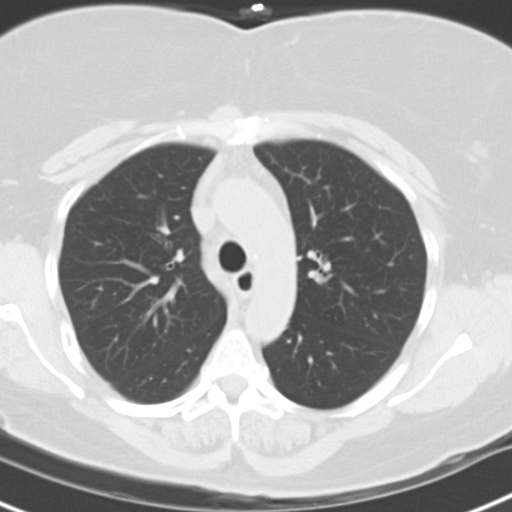
[im 44/56  lung]
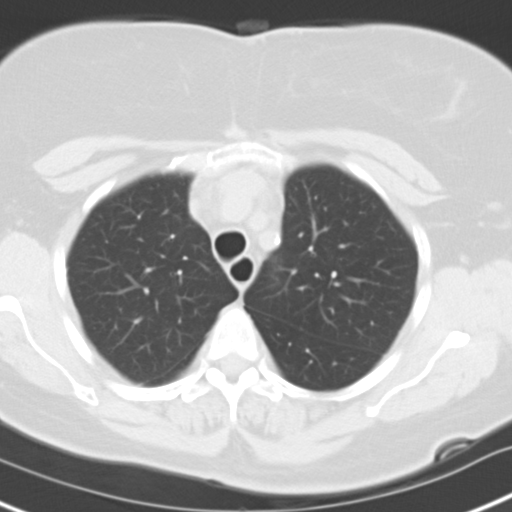
[im 48/56  lung]
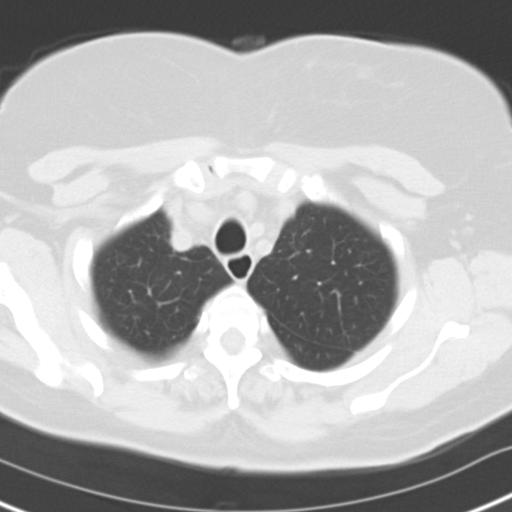
[im 52/56  lung]
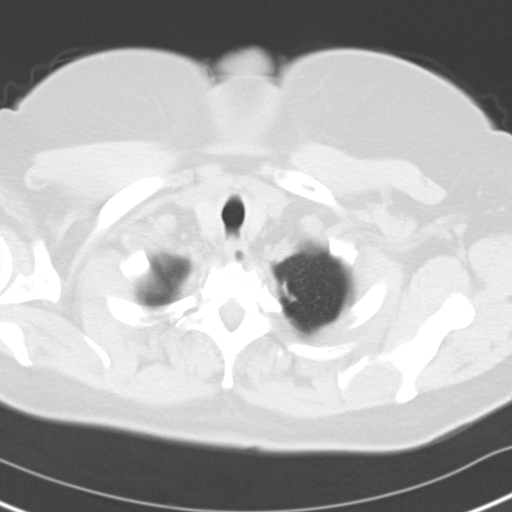

[Series 602: coronal chest · coronal · 0.59mm/px · 3 of 120 slices shown]
[im 24/120  lung]
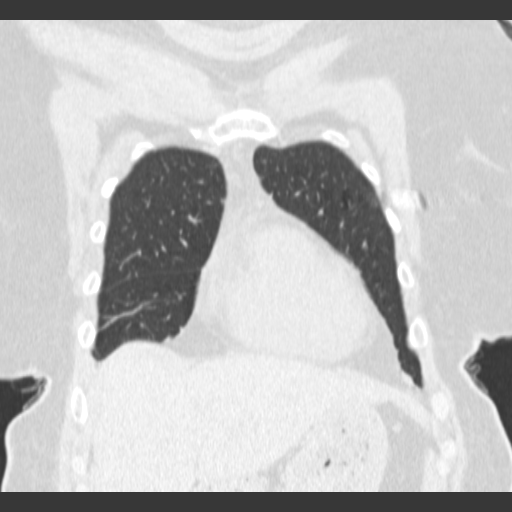
[im 48/120  lung]
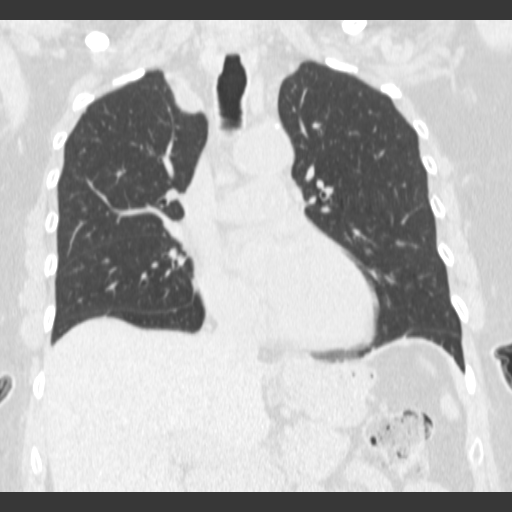
[im 72/120  lung]
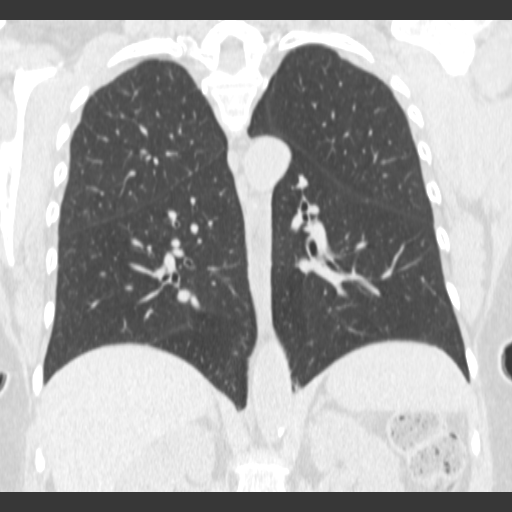

[15 of 36 positions shown; findings below may reference images not displayed]

FINDINGS: Again seen is a smoothly marginated nodule in the right middle lobe on image #25 measuring approximately 0.7 x 0.8 cm, unchanged.  Scarring is noted anteriorly in the right lung base which is also stable.  Lungs are otherwise clear.  Some small mediastinal lymph nodes are seen, including a precarinal node, on image #20, series 2 measuring 0.9 cm, stable.  Surgical clip is again noted in the left breast.  There is no axillary or hilar adenopathy.  Heart size is normal.  No pleural or pericardial effusion.  Heart size is upper normal.  Small hiatal hernia is noted.  Incidentally imaged upper abdomen is unremarkable.  No focal bony abnormality.
IMPRESSION: Right middle lobe pulmonary nodule is stable since 07/03/04, approximately 21 months, consistent with benignity.

## 2007-10-07 DIAGNOSIS — G4730 Sleep apnea, unspecified: Secondary | ICD-10-CM | POA: Insufficient documentation

## 2007-10-07 DIAGNOSIS — I10 Essential (primary) hypertension: Secondary | ICD-10-CM | POA: Insufficient documentation

## 2007-10-07 DIAGNOSIS — K5730 Diverticulosis of large intestine without perforation or abscess without bleeding: Secondary | ICD-10-CM | POA: Insufficient documentation

## 2007-10-07 DIAGNOSIS — K648 Other hemorrhoids: Secondary | ICD-10-CM | POA: Insufficient documentation

## 2007-10-07 DIAGNOSIS — M129 Arthropathy, unspecified: Secondary | ICD-10-CM | POA: Insufficient documentation

## 2007-10-10 ENCOUNTER — Ambulatory Visit: Payer: Self-pay | Admitting: Internal Medicine

## 2007-10-20 ENCOUNTER — Ambulatory Visit: Payer: Self-pay | Admitting: Internal Medicine

## 2007-11-02 ENCOUNTER — Ambulatory Visit: Payer: Self-pay | Admitting: Internal Medicine

## 2007-11-24 ENCOUNTER — Ambulatory Visit: Payer: Self-pay | Admitting: Internal Medicine

## 2007-11-24 ENCOUNTER — Encounter: Payer: Self-pay | Admitting: Internal Medicine

## 2007-11-24 LAB — CONVERTED CEMR LAB
Basophils Absolute: 0 10*3/uL (ref 0.0–0.1)
Basophils Relative: 0 % (ref 0–1)
Eosinophils Absolute: 0.1 10*3/uL (ref 0.0–0.7)
Eosinophils Relative: 1 % (ref 0–5)
HCT: 37.7 % (ref 36.0–46.0)
Hemoglobin: 12.1 g/dL (ref 12.0–15.0)
Lymphocytes Relative: 25 % (ref 12–46)
Lymphs Abs: 2.4 10*3/uL (ref 0.7–4.0)
MCHC: 32.1 g/dL (ref 30.0–36.0)
MCV: 75.1 fL — ABNORMAL LOW (ref 78.0–100.0)
Monocytes Absolute: 0.7 10*3/uL (ref 0.1–1.0)
Monocytes Relative: 8 % (ref 3–12)
Neutro Abs: 6.5 10*3/uL (ref 1.7–7.7)
Neutrophils Relative %: 67 % (ref 43–77)
Platelets: 307 10*3/uL (ref 150–400)
RBC: 5.02 M/uL (ref 3.87–5.11)
RDW: 16.9 % — ABNORMAL HIGH (ref 11.5–15.5)
TSH: 1.497 microintl units/mL (ref 0.350–4.50)
WBC: 9.7 10*3/uL (ref 4.0–10.5)

## 2007-12-01 ENCOUNTER — Ambulatory Visit: Payer: Self-pay | Admitting: Internal Medicine

## 2007-12-08 ENCOUNTER — Encounter: Admission: RE | Admit: 2007-12-08 | Discharge: 2007-12-08 | Payer: Self-pay | Admitting: Family Medicine

## 2007-12-13 ENCOUNTER — Encounter: Payer: Self-pay | Admitting: Internal Medicine

## 2007-12-13 ENCOUNTER — Encounter: Admission: RE | Admit: 2007-12-13 | Discharge: 2007-12-13 | Payer: Self-pay | Admitting: Family Medicine

## 2007-12-13 ENCOUNTER — Ambulatory Visit: Payer: Self-pay | Admitting: Thoracic Surgery

## 2007-12-28 ENCOUNTER — Ambulatory Visit: Payer: Self-pay | Admitting: Obstetrics and Gynecology

## 2007-12-28 ENCOUNTER — Encounter: Payer: Self-pay | Admitting: Obstetrics and Gynecology

## 2007-12-30 ENCOUNTER — Ambulatory Visit: Payer: Self-pay | Admitting: Sports Medicine

## 2007-12-30 DIAGNOSIS — M76899 Other specified enthesopathies of unspecified lower limb, excluding foot: Secondary | ICD-10-CM | POA: Insufficient documentation

## 2007-12-30 DIAGNOSIS — M79609 Pain in unspecified limb: Secondary | ICD-10-CM | POA: Insufficient documentation

## 2008-01-02 ENCOUNTER — Ambulatory Visit (HOSPITAL_COMMUNITY): Admission: RE | Admit: 2008-01-02 | Discharge: 2008-01-02 | Payer: Self-pay | Admitting: Obstetrics and Gynecology

## 2008-01-16 ENCOUNTER — Ambulatory Visit: Payer: Self-pay | Admitting: Internal Medicine

## 2008-01-17 ENCOUNTER — Ambulatory Visit: Payer: Self-pay | Admitting: Internal Medicine

## 2008-01-17 ENCOUNTER — Encounter: Payer: Self-pay | Admitting: Internal Medicine

## 2008-01-19 ENCOUNTER — Encounter: Payer: Self-pay | Admitting: Internal Medicine

## 2008-01-20 ENCOUNTER — Telehealth: Payer: Self-pay | Admitting: Internal Medicine

## 2008-01-23 ENCOUNTER — Ambulatory Visit: Payer: Self-pay | Admitting: Internal Medicine

## 2008-01-23 DIAGNOSIS — R1319 Other dysphagia: Secondary | ICD-10-CM | POA: Insufficient documentation

## 2008-01-23 LAB — CONVERTED CEMR LAB: Prealbumin: 26.8 mg/dL (ref 18.0–45.0)

## 2008-01-24 DIAGNOSIS — D509 Iron deficiency anemia, unspecified: Secondary | ICD-10-CM | POA: Insufficient documentation

## 2008-01-24 LAB — CONVERTED CEMR LAB
ALT: 11 units/L (ref 0–35)
AST: 14 units/L (ref 0–37)
Albumin: 4 g/dL (ref 3.5–5.2)
Alkaline Phosphatase: 54 units/L (ref 39–117)
BUN: 10 mg/dL (ref 6–23)
Basophils Absolute: 0.1 10*3/uL (ref 0.0–0.1)
Basophils Relative: 0.7 % (ref 0.0–3.0)
CO2: 25 meq/L (ref 19–32)
Calcium: 9 mg/dL (ref 8.4–10.5)
Chloride: 112 meq/L (ref 96–112)
Creatinine, Ser: 0.8 mg/dL (ref 0.4–1.2)
Eosinophils Absolute: 0.1 10*3/uL (ref 0.0–0.7)
Eosinophils Relative: 0.8 % (ref 0.0–5.0)
GFR calc Af Amer: 92 mL/min
GFR calc non Af Amer: 76 mL/min
Glucose, Bld: 92 mg/dL (ref 70–99)
HCT: 34.7 % — ABNORMAL LOW (ref 36.0–46.0)
Hemoglobin: 11.4 g/dL — ABNORMAL LOW (ref 12.0–15.0)
Iron: 62 ug/dL (ref 42–145)
Lymphocytes Relative: 26.7 % (ref 12.0–46.0)
MCHC: 33 g/dL (ref 30.0–36.0)
MCV: 77.7 fL — ABNORMAL LOW (ref 78.0–100.0)
Monocytes Absolute: 0.7 10*3/uL (ref 0.1–1.0)
Monocytes Relative: 8.6 % (ref 3.0–12.0)
Neutro Abs: 5.5 10*3/uL (ref 1.4–7.7)
Neutrophils Relative %: 63.2 % (ref 43.0–77.0)
Platelets: 299 10*3/uL (ref 150–400)
Potassium: 3.3 meq/L — ABNORMAL LOW (ref 3.5–5.1)
RBC: 4.46 M/uL (ref 3.87–5.11)
RDW: 13.3 % (ref 11.5–14.6)
Saturation Ratios: 15.4 % — ABNORMAL LOW (ref 20.0–50.0)
Sodium: 143 meq/L (ref 135–145)
TSH: 1.63 microintl units/mL (ref 0.35–5.50)
Total Bilirubin: 0.5 mg/dL (ref 0.3–1.2)
Total Protein: 7 g/dL (ref 6.0–8.3)
Transferrin: 288.4 mg/dL (ref >=212.0)
Vitamin B-12: 423 pg/mL (ref 211–911)
WBC: 8.7 10*3/uL (ref 4.5–10.5)

## 2008-01-24 IMAGING — RF DG ESOPHAGUS
12 series · 12 of 12 positions shown · non-contrast
Comparison: none

CLINICAL DATA: Chest pain.  History of Nissen fundoplication.  
BARIUM ESOPHAGRAM:

[Series 1: run · 1 of 1 slices shown (1 of 12)]
[im 1/1]
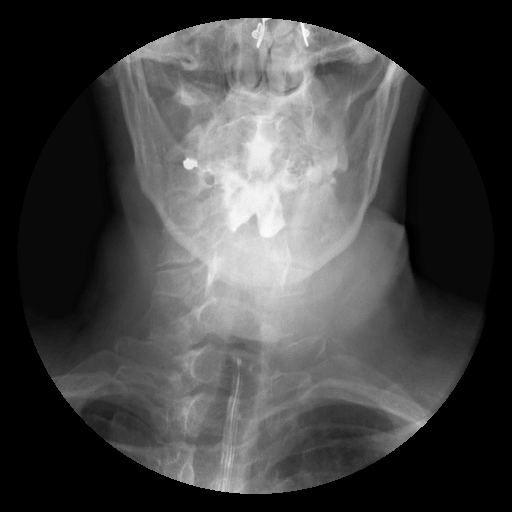

[Series 2: run · 1 of 1 slices shown (2 of 12)]
[im 1/1]
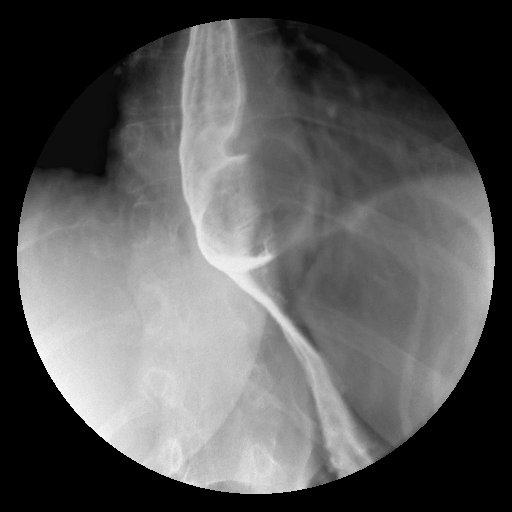

[Series 3: run · 1 of 1 slices shown (3 of 12)]
[im 1/1]
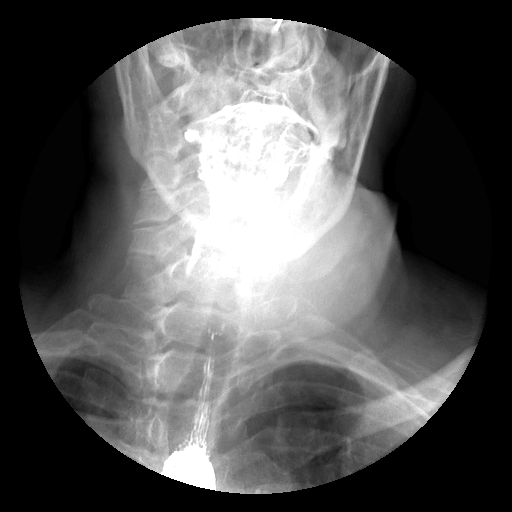

[Series 4: run · 1 of 1 slices shown (4 of 12)]
[im 1/1]
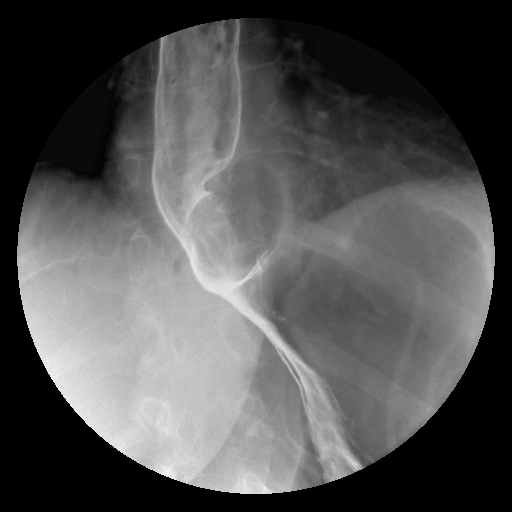

[Series 5: run · 1 of 1 slices shown (5 of 12)]
[im 1/1]
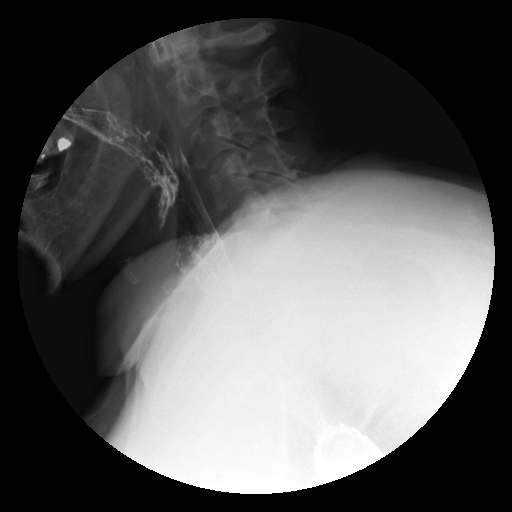

[Series 6: run · 1 of 1 slices shown (6 of 12)]
[im 1/1]
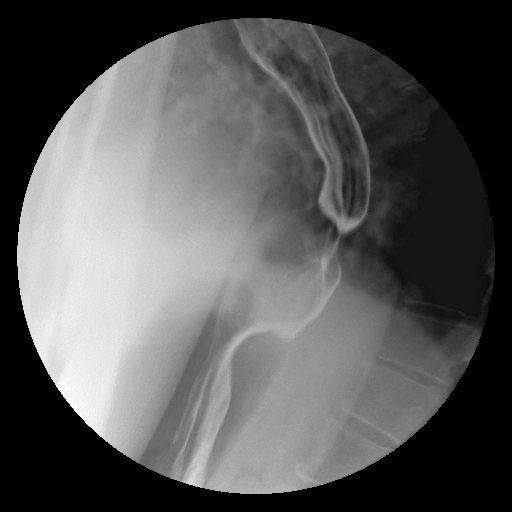

[Series 7: run · 1 of 1 slices shown (7 of 12)]
[im 1/1]
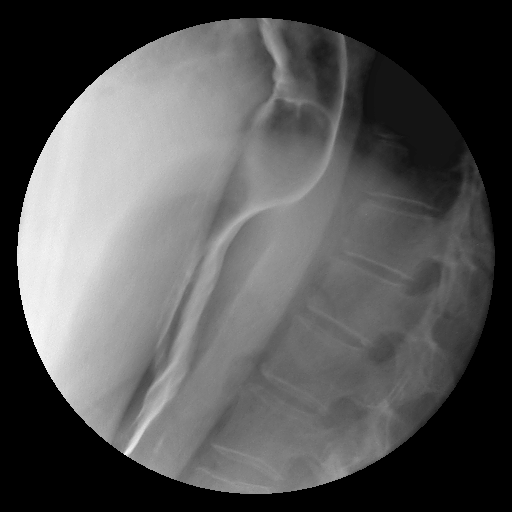

[Series 8: run · 1 of 1 slices shown (8 of 12)]
[im 1/1]
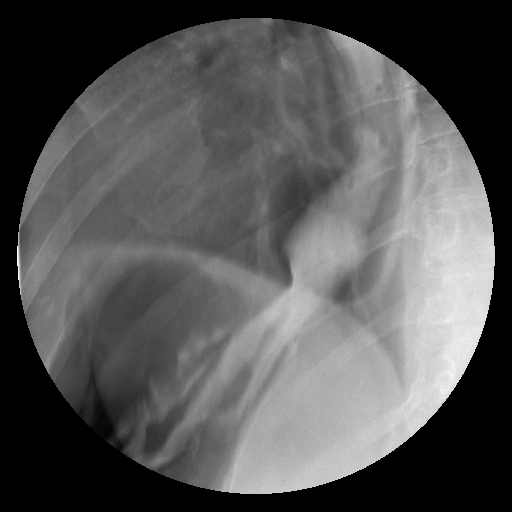

[Series 9: run · 1 of 1 slices shown (9 of 12)]
[im 1/1]
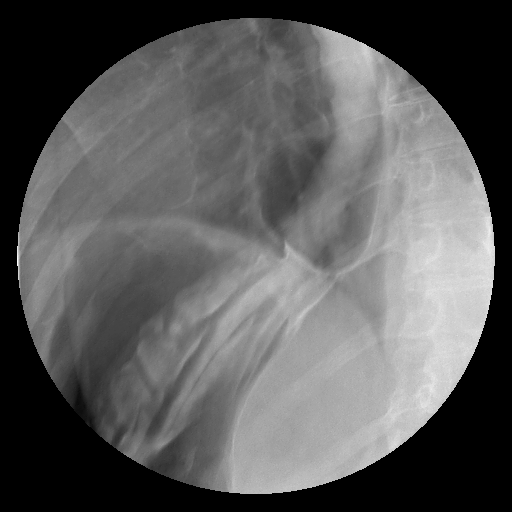

[Series 10: run · 1 of 1 slices shown (10 of 12)]
[im 1/1]
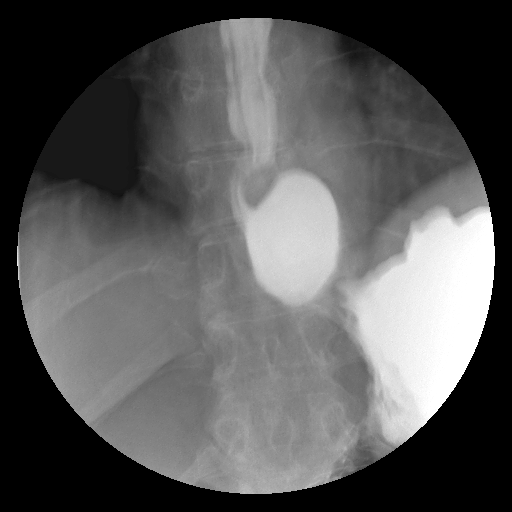

[Series 11: run · 1 of 1 slices shown (11 of 12)]
[im 1/1]
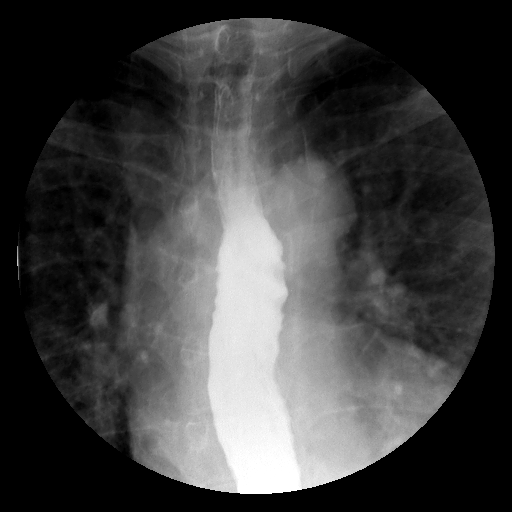

[Series 12: run · 1 of 1 slices shown (12 of 12)]
[im 1/1]
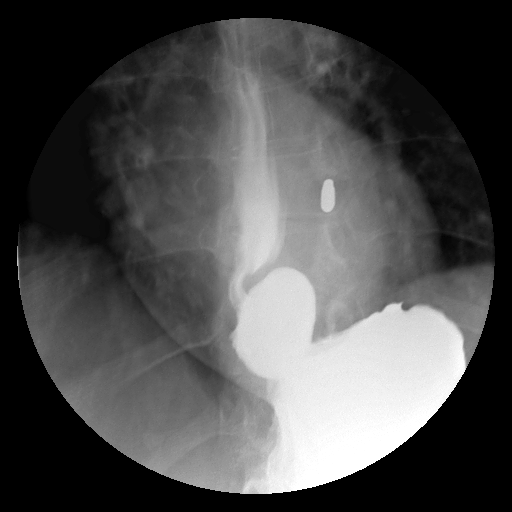

[12 of 12 positions shown; findings below may reference images not displayed]

FINDINGS: Symmetrical valleculae and pyriform sinuses.  Swallowing function appears normal.  Slightly diminished primary esophageal striping wave.  Minimal tertiary contractions.  Findings compatible with recurrent hiatal hernia with paraesophageal component projecting to the left.  The paraesophageal component was gas-filled with the patient in the erect position but filled with barium when the patient was recumbent.  No esophageal stricture.  The patient swallowed a 13 mm barium tablet in the erect position.  The tablet traversed the esophagus and entered the stomach without delay.  There was marked gastroesophageal reflux.  Esophagus is moderately dilated.
IMPRESSION: Dilated slightly atonic appearing esophagus.  At least slightly diminished primary striping wave.  Hiatal hernia with sliding and paraesophageal components.  GE reflux.

## 2008-01-25 ENCOUNTER — Other Ambulatory Visit: Admission: RE | Admit: 2008-01-25 | Discharge: 2008-01-25 | Payer: Self-pay | Admitting: Obstetrics and Gynecology

## 2008-01-25 ENCOUNTER — Telehealth: Payer: Self-pay | Admitting: Internal Medicine

## 2008-01-25 ENCOUNTER — Ambulatory Visit: Payer: Self-pay | Admitting: Gynecology

## 2008-01-26 ENCOUNTER — Ambulatory Visit (HOSPITAL_COMMUNITY): Admission: RE | Admit: 2008-01-26 | Discharge: 2008-01-26 | Payer: Self-pay | Admitting: Internal Medicine

## 2008-01-27 ENCOUNTER — Encounter: Payer: Self-pay | Admitting: Internal Medicine

## 2008-02-01 ENCOUNTER — Ambulatory Visit: Payer: Self-pay | Admitting: Sports Medicine

## 2008-02-01 DIAGNOSIS — M25559 Pain in unspecified hip: Secondary | ICD-10-CM | POA: Insufficient documentation

## 2008-02-02 ENCOUNTER — Encounter (HOSPITAL_COMMUNITY): Admission: RE | Admit: 2008-02-02 | Discharge: 2008-05-02 | Payer: Self-pay | Admitting: Internal Medicine

## 2008-02-08 ENCOUNTER — Ambulatory Visit: Payer: Self-pay | Admitting: Internal Medicine

## 2008-02-09 ENCOUNTER — Ambulatory Visit: Payer: Self-pay | Admitting: Obstetrics & Gynecology

## 2008-02-27 ENCOUNTER — Telehealth: Payer: Self-pay | Admitting: Internal Medicine

## 2008-02-29 ENCOUNTER — Ambulatory Visit: Payer: Self-pay | Admitting: Internal Medicine

## 2008-03-05 ENCOUNTER — Ambulatory Visit: Payer: Self-pay | Admitting: Sports Medicine

## 2008-03-07 ENCOUNTER — Ambulatory Visit: Payer: Self-pay | Admitting: Internal Medicine

## 2008-03-07 DIAGNOSIS — R079 Chest pain, unspecified: Secondary | ICD-10-CM | POA: Insufficient documentation

## 2008-03-08 LAB — CONVERTED CEMR LAB
Basophils Absolute: 0 10*3/uL (ref 0.0–0.1)
Basophils Relative: 0.6 % (ref 0.0–3.0)
Eosinophils Absolute: 0.1 10*3/uL (ref 0.0–0.7)
Eosinophils Relative: 0.9 % (ref 0.0–5.0)
HCT: 33.6 % — ABNORMAL LOW (ref 36.0–46.0)
Hemoglobin: 10.9 g/dL — ABNORMAL LOW (ref 12.0–15.0)
Lymphocytes Relative: 30.5 % (ref 12.0–46.0)
MCHC: 32.3 g/dL (ref 30.0–36.0)
MCV: 77.6 fL — ABNORMAL LOW (ref 78.0–100.0)
Monocytes Absolute: 0.5 10*3/uL (ref 0.1–1.0)
Monocytes Relative: 6.1 % (ref 3.0–12.0)
Neutro Abs: 4.9 10*3/uL (ref 1.4–7.7)
Neutrophils Relative %: 61.9 % (ref 43.0–77.0)
Platelets: 228 10*3/uL (ref 150–400)
Potassium: 4.3 meq/L (ref 3.5–5.1)
RBC: 4.33 M/uL (ref 3.87–5.11)
RDW: 12.9 % (ref 11.5–14.6)
WBC: 7.9 10*3/uL (ref 4.5–10.5)

## 2008-03-12 ENCOUNTER — Telehealth: Payer: Self-pay | Admitting: Internal Medicine

## 2008-03-26 ENCOUNTER — Telehealth: Payer: Self-pay | Admitting: Internal Medicine

## 2008-03-26 ENCOUNTER — Ambulatory Visit: Payer: Self-pay | Admitting: Internal Medicine

## 2008-03-28 ENCOUNTER — Ambulatory Visit: Payer: Self-pay | Admitting: Cardiology

## 2008-04-11 ENCOUNTER — Encounter: Payer: Self-pay | Admitting: Internal Medicine

## 2008-04-17 ENCOUNTER — Ambulatory Visit: Payer: Self-pay

## 2008-04-17 ENCOUNTER — Encounter: Payer: Self-pay | Admitting: Cardiology

## 2008-04-19 ENCOUNTER — Ambulatory Visit: Payer: Self-pay | Admitting: Cardiology

## 2008-04-19 LAB — CONVERTED CEMR LAB
BUN: 12 mg/dL (ref 6–23)
Basophils Absolute: 0 10*3/uL (ref 0.0–0.1)
Basophils Relative: 0.2 % (ref 0.0–3.0)
CO2: 29 meq/L (ref 19–32)
Calcium: 9.1 mg/dL (ref 8.4–10.5)
Chloride: 108 meq/L (ref 96–112)
Creatinine, Ser: 0.5 mg/dL (ref 0.4–1.2)
Eosinophils Absolute: 0.1 10*3/uL (ref 0.0–0.7)
Eosinophils Relative: 0.9 % (ref 0.0–5.0)
GFR calc Af Amer: 158 mL/min
GFR calc non Af Amer: 131 mL/min
Glucose, Bld: 99 mg/dL (ref 70–99)
HCT: 31.5 % — ABNORMAL LOW (ref 36.0–46.0)
Hemoglobin: 10.4 g/dL — ABNORMAL LOW (ref 12.0–15.0)
INR: 1 (ref 0.8–1.0)
Lymphocytes Relative: 27.1 % (ref 12.0–46.0)
MCHC: 33 g/dL (ref 30.0–36.0)
MCV: 75.1 fL — ABNORMAL LOW (ref 78.0–100.0)
Monocytes Absolute: 0.4 10*3/uL (ref 0.1–1.0)
Monocytes Relative: 6.6 % (ref 3.0–12.0)
Neutro Abs: 3.9 10*3/uL (ref 1.4–7.7)
Neutrophils Relative %: 65.2 % (ref 43.0–77.0)
Platelets: 299 10*3/uL (ref 150–400)
Potassium: 3.4 meq/L — ABNORMAL LOW (ref 3.5–5.1)
Prothrombin Time: 10.9 s (ref 10.9–13.3)
RBC: 4.19 M/uL (ref 3.87–5.11)
RDW: 13.4 % (ref 11.5–14.6)
Sodium: 142 meq/L (ref 135–145)
WBC: 6 10*3/uL (ref 4.5–10.5)

## 2008-04-20 ENCOUNTER — Inpatient Hospital Stay (HOSPITAL_BASED_OUTPATIENT_CLINIC_OR_DEPARTMENT_OTHER): Admission: RE | Admit: 2008-04-20 | Discharge: 2008-04-20 | Payer: Self-pay | Admitting: Internal Medicine

## 2008-04-20 ENCOUNTER — Ambulatory Visit: Payer: Self-pay | Admitting: Internal Medicine

## 2008-04-24 IMAGING — MG MM SCREEN MAMMOGRAM BILATERAL
4 series · 4 of 4 positions shown · non-contrast
Comparison: none

DG SCREEN MAMMOGRAM BILATERAL
Bilateral CC and MLO view(s) were taken.

DIGITAL SCREENING MAMMOGRAM WITH CAD:
There is a fibroglandular pattern.  Microcalcifications are present in the right breast.  
Characterization with magnification views is recommended.  No mass or malignant type calcifications
are identified in the left breast.  Compared with prior studies.

[R CC]
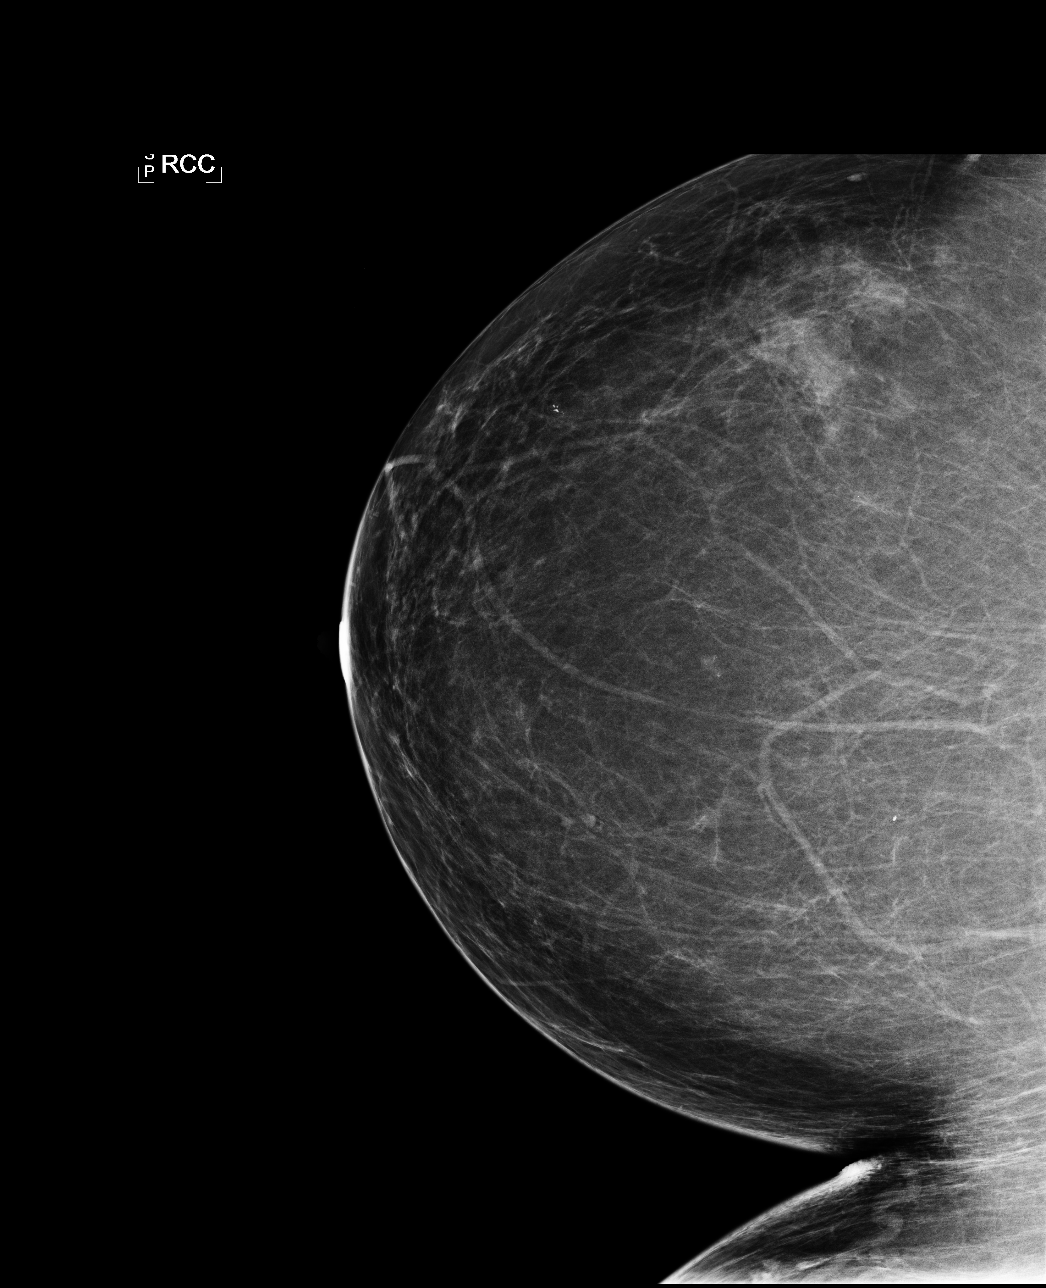

[L CC]
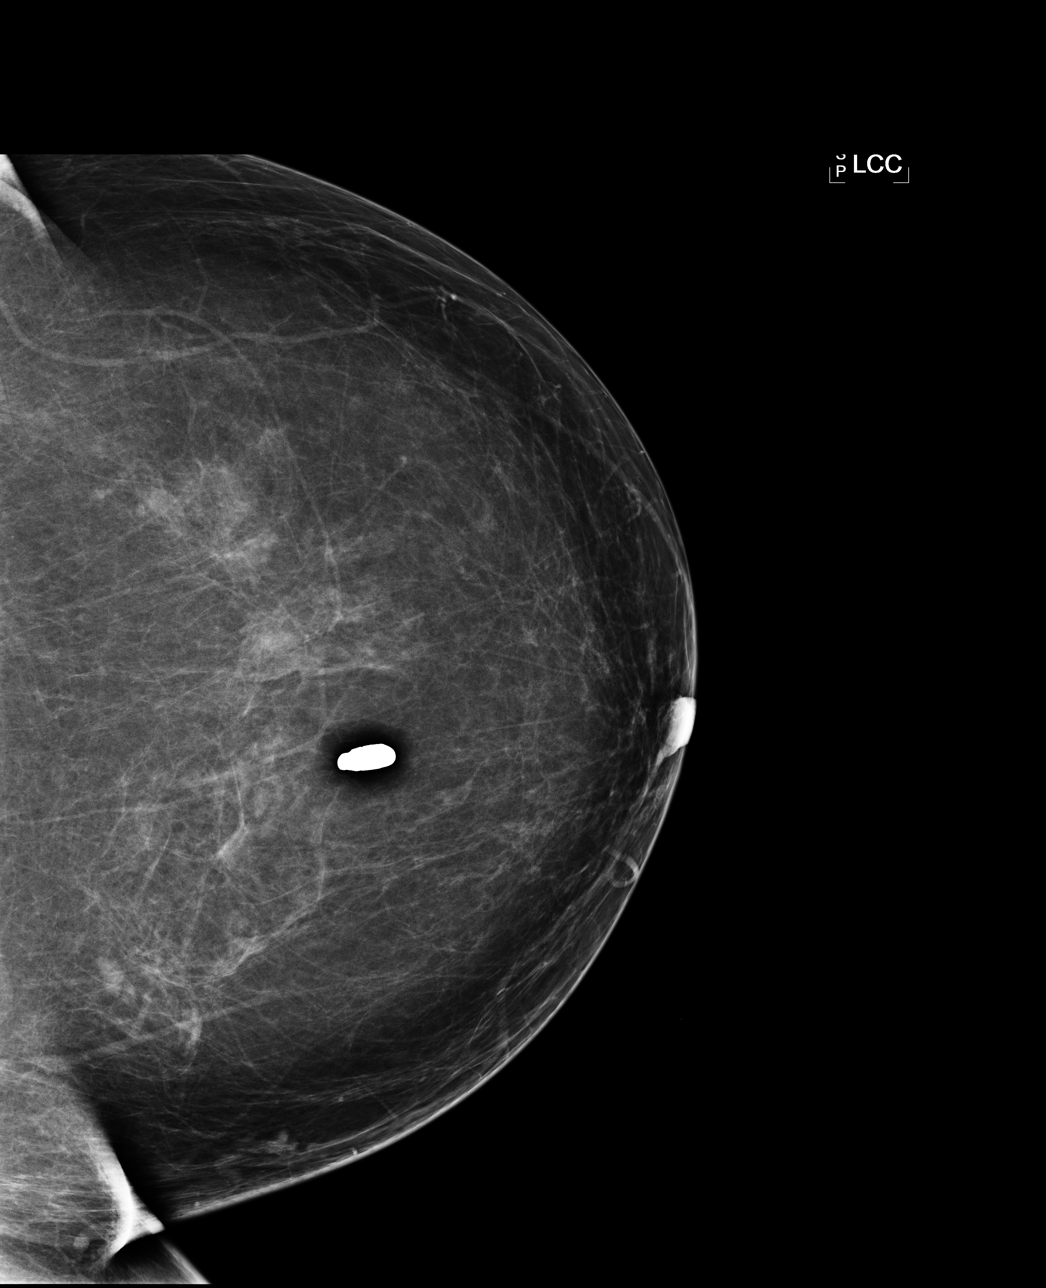

[L MLO]
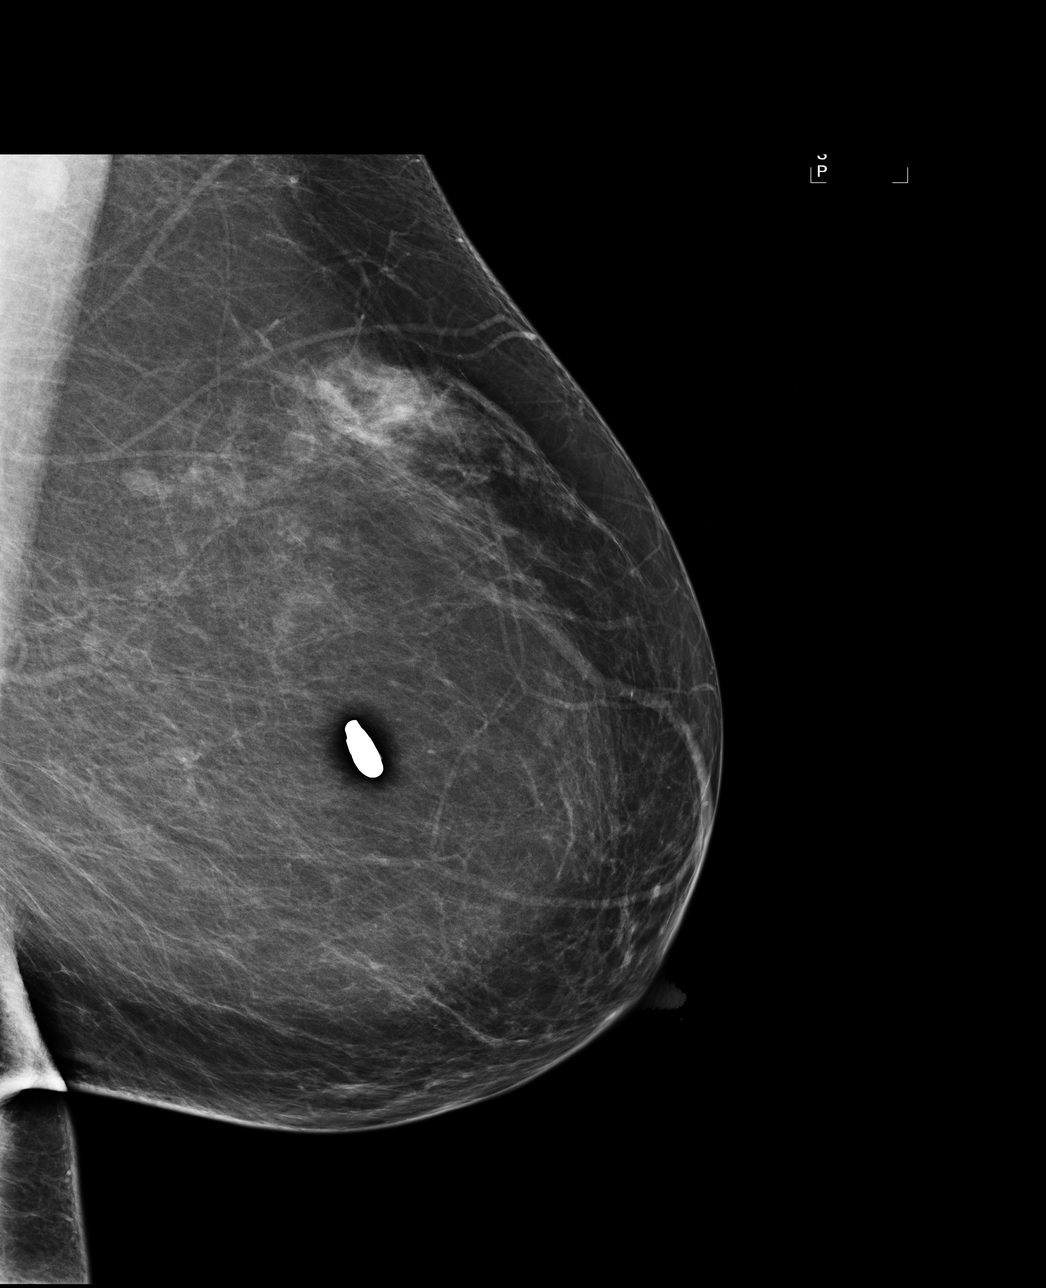

[R MLO]
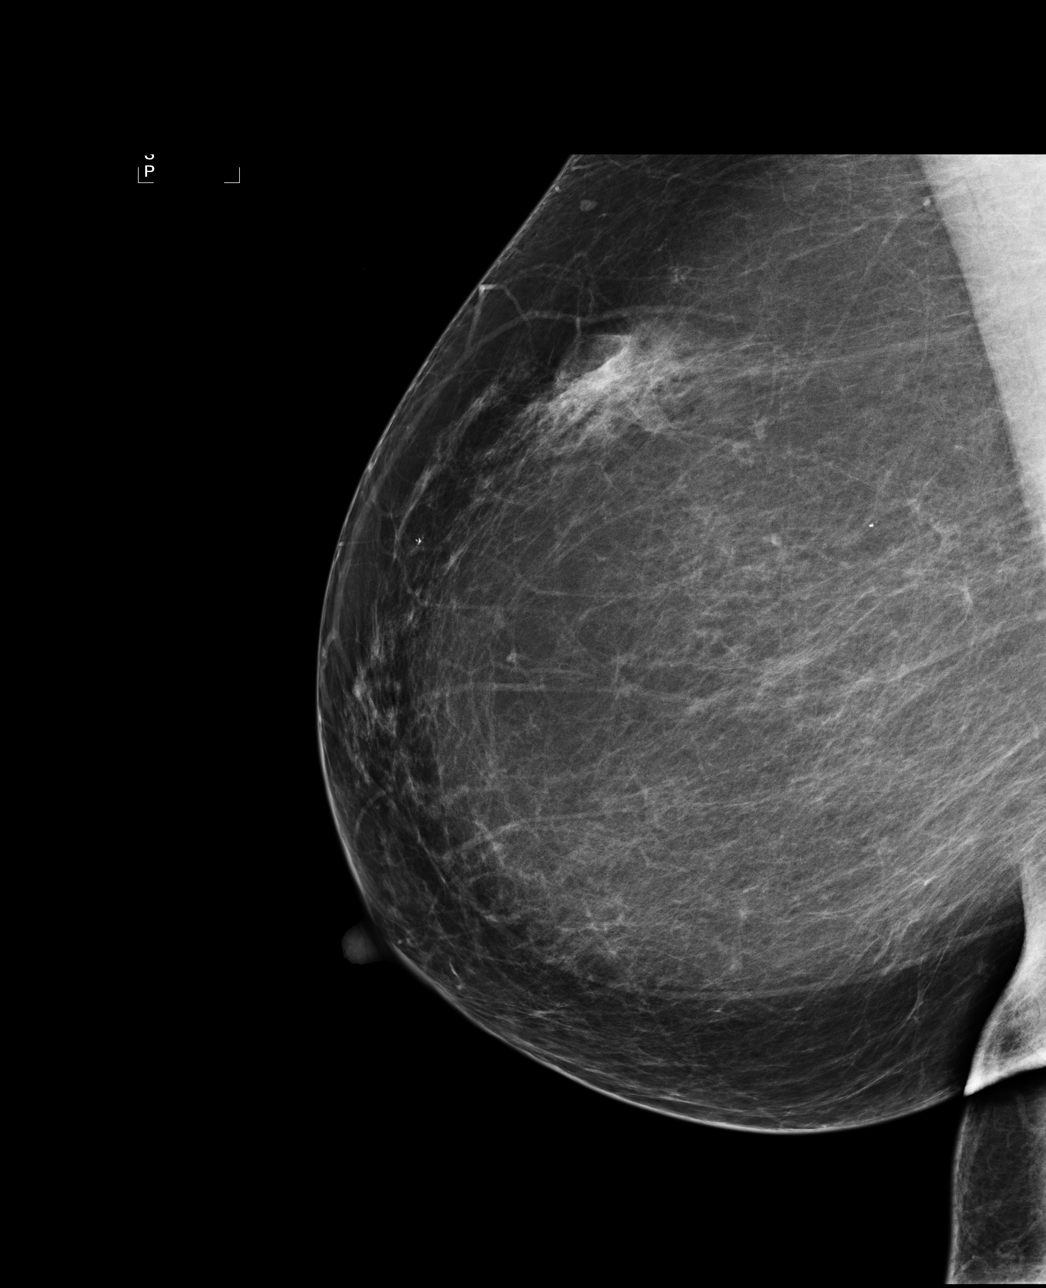

[4 of 4 positions shown; findings below may reference images not displayed]

IMPRESSION: Calcifications, right breast.  Additional evaluation is indicated. The patient will be contacted 
for additional studies and a supplementary report will follow.  No specific mammographic evidence 
of malignancy, left breast.

ASSESSMENT: Need additional imaging evaluation and/or prior mammograms for comparison - BI-RADS 0

Further imaging of the right breast.
ANALYZED BY COMPUTER AIDED DETECTION. , THIS PROCEDURE WAS A DIGITAL MAMMOGRAM.

## 2008-05-02 ENCOUNTER — Ambulatory Visit: Payer: Self-pay | Admitting: Thoracic Surgery

## 2008-05-02 ENCOUNTER — Encounter: Admission: RE | Admit: 2008-05-02 | Discharge: 2008-05-02 | Payer: Self-pay | Admitting: Thoracic Surgery

## 2008-05-04 HISTORY — PX: ROTATOR CUFF REPAIR: SHX139

## 2008-05-06 IMAGING — MG MM DIAGNOSTIC LTD RIGHT
4 series · 4 of 4 positions shown · non-contrast
Comparison: none

[REDACTED] RIGHT
CC and MLO view(s) were taken of the right breast.
Technologist: Sanford Concha

DIGITAL LIMITED RIGHT DIAGNOSTIC MAMMOGRAM:
CLINICAL DATA: Abnormal screening mammogram.

[R CC (1 of 2)]
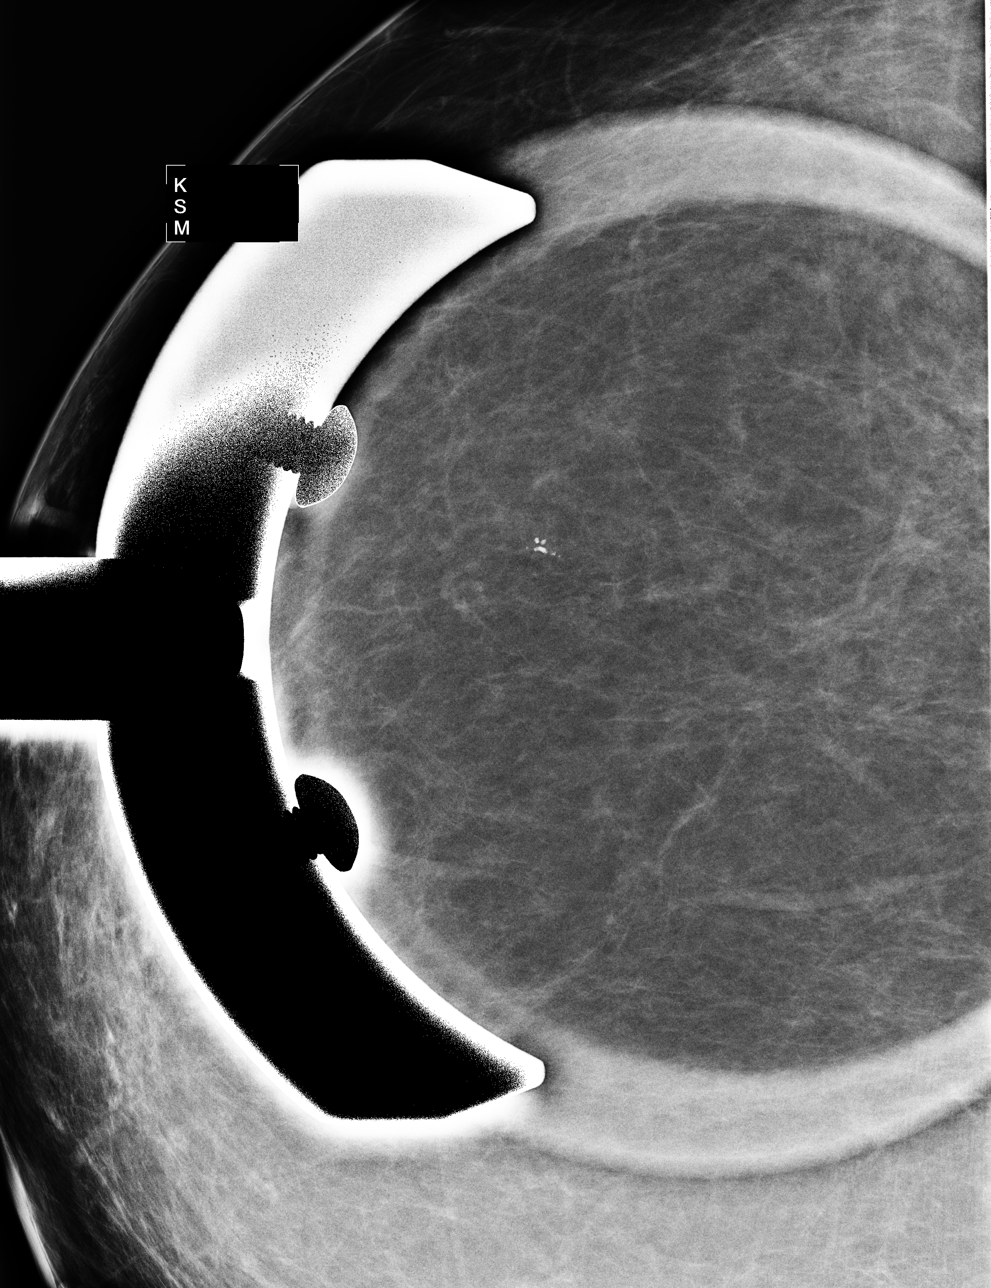

[R ML (1 of 2)]
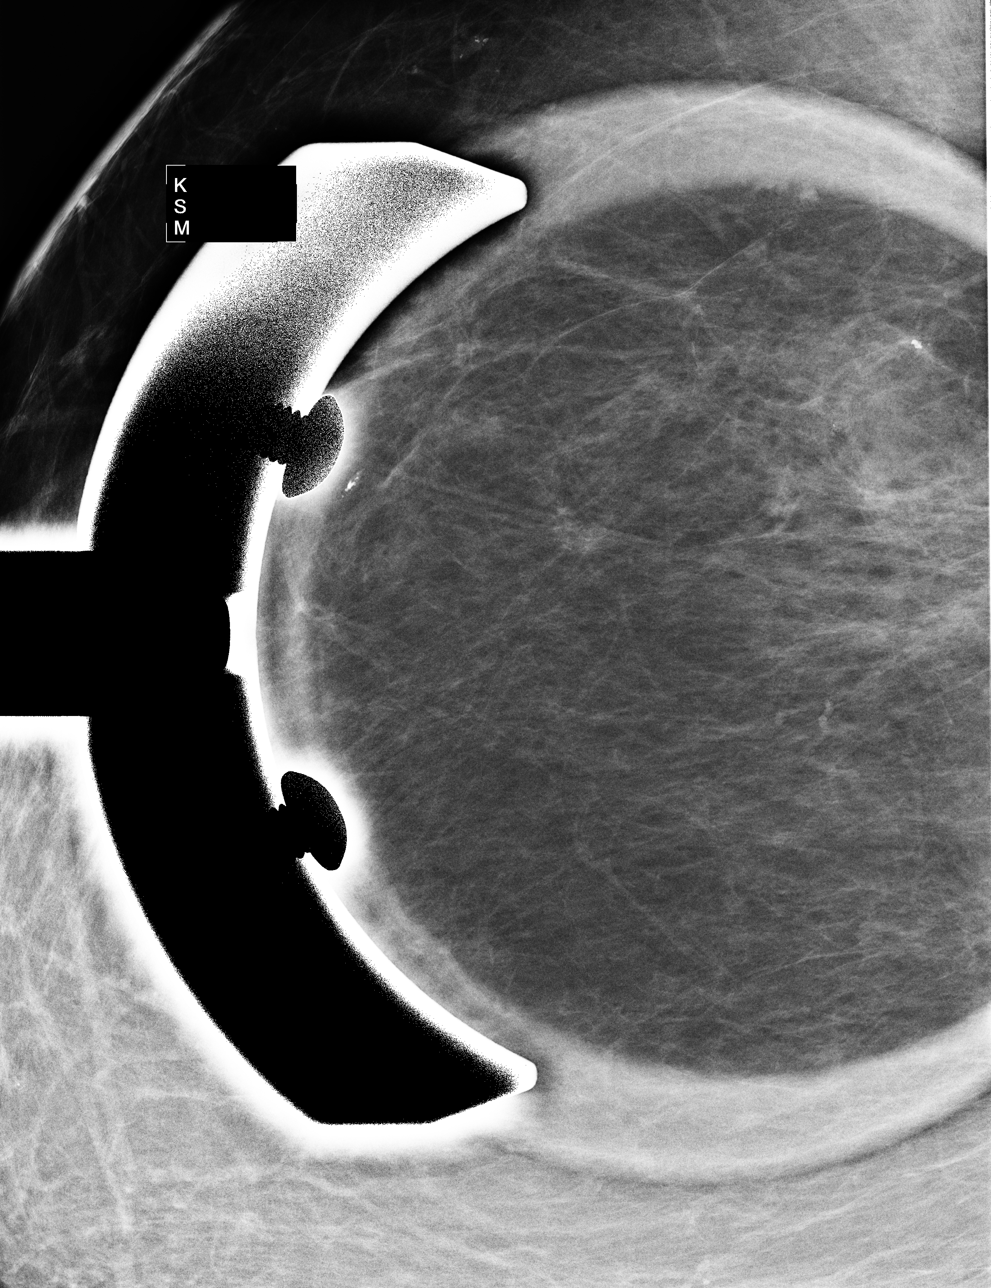

[R ML (2 of 2)]
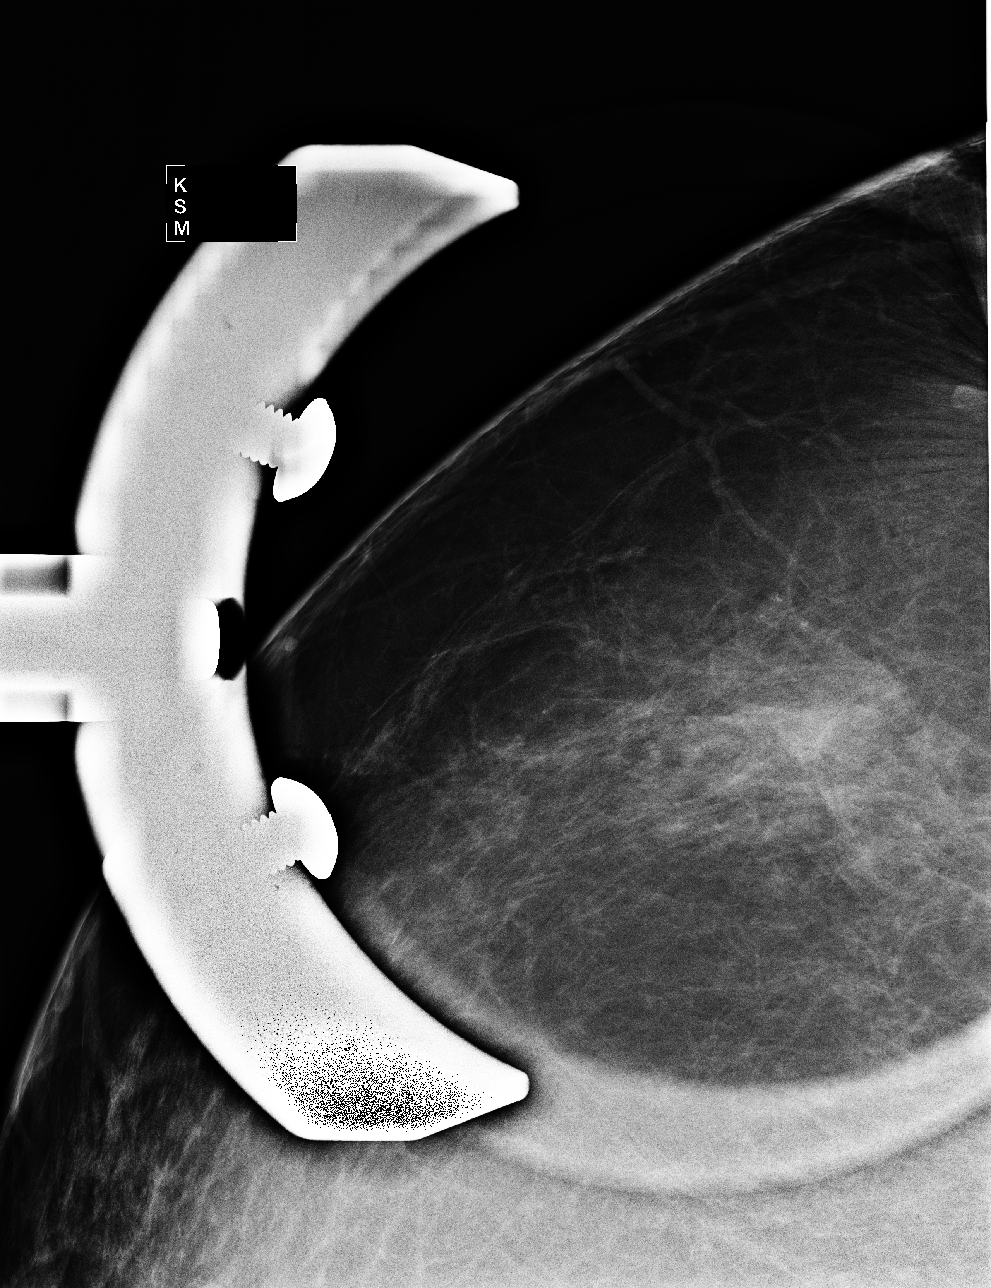

[R CC (2 of 2)]
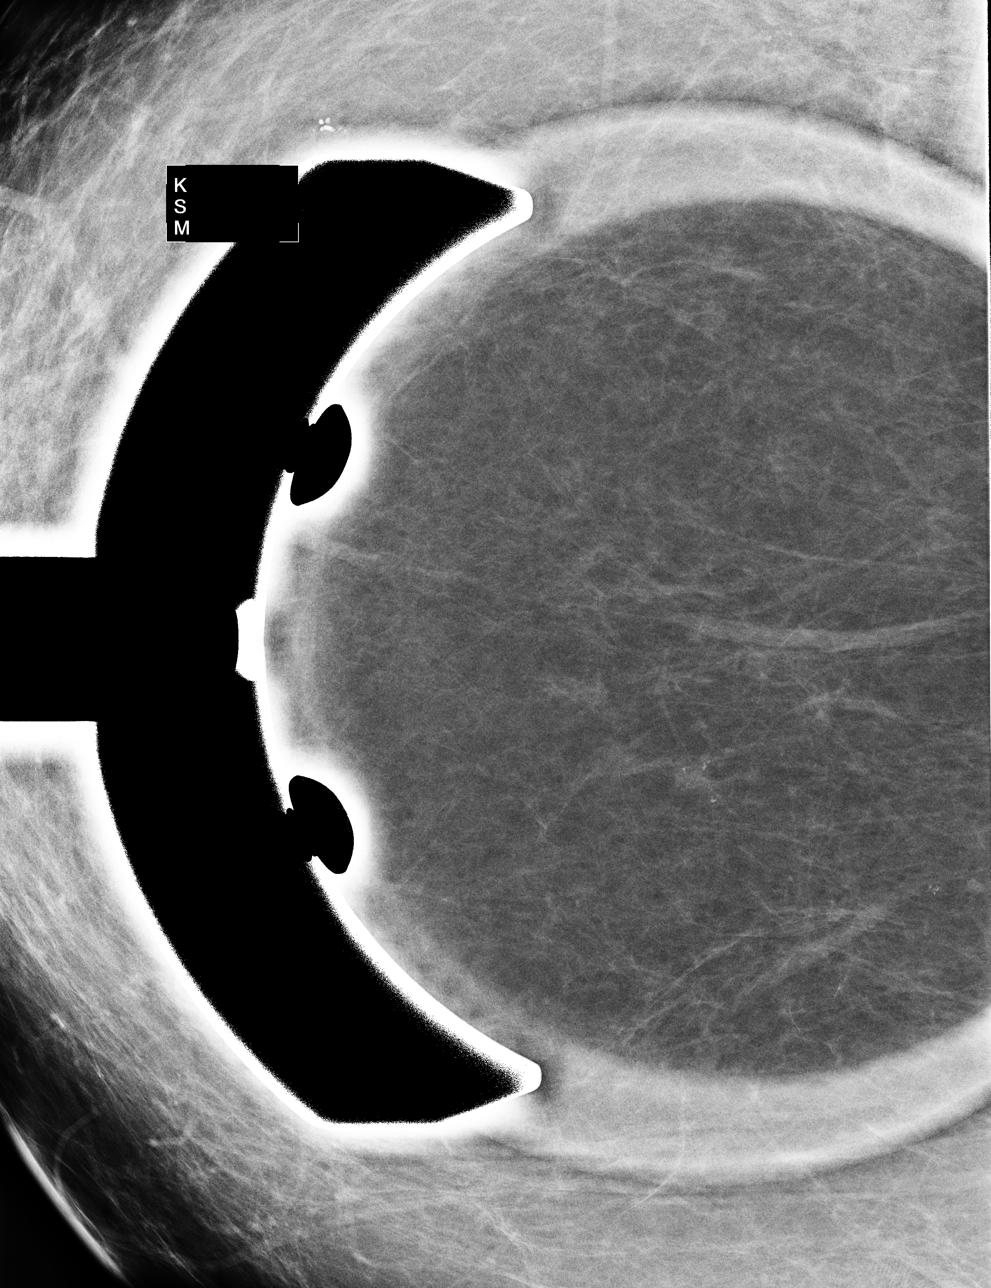

[4 of 4 positions shown; findings below may reference images not displayed]

Magnification right CC and right ML views were obtained in further evaluation of the questioned 
calcifications noted on the screening study within the superior portion of the right breast.  On 
these views, the calcifications appear likely to be dystrophic and are associated with a small area
of nodularity.  The area of nodularity appears stable when compared with prior studies.  Possibly 
this represents a small fibroadenoma that is undergoing involutional calcification.  However, I 
would recommend an initial six month follow-up right breast diagnostic mammogram.  The findings 
were discussed with the patient.
IMPRESSION: Probably benign calcifications located superiorly within the right breast as discussed above.  
Recommend six month follow-up right breast diagnostic mammography.

ASSESSMENT: Probably benign - BI-RADS 3

Follow-up diagnostic mammogram of the right breast in 6 months.
, THIS PROCEDURE WAS A DIGITAL MAMMOGRAM.

## 2008-05-08 ENCOUNTER — Ambulatory Visit: Payer: Self-pay | Admitting: *Deleted

## 2008-05-08 ENCOUNTER — Ambulatory Visit: Payer: Self-pay | Admitting: Internal Medicine

## 2008-05-08 ENCOUNTER — Inpatient Hospital Stay (HOSPITAL_COMMUNITY): Admission: EM | Admit: 2008-05-08 | Discharge: 2008-05-10 | Payer: Self-pay | Admitting: Emergency Medicine

## 2008-05-09 ENCOUNTER — Encounter: Payer: Self-pay | Admitting: Internal Medicine

## 2008-05-18 ENCOUNTER — Telehealth: Payer: Self-pay | Admitting: Internal Medicine

## 2008-05-18 ENCOUNTER — Encounter: Payer: Self-pay | Admitting: Internal Medicine

## 2008-05-18 ENCOUNTER — Ambulatory Visit (HOSPITAL_COMMUNITY): Admission: RE | Admit: 2008-05-18 | Discharge: 2008-05-18 | Payer: Self-pay | Admitting: Physician Assistant

## 2008-05-18 ENCOUNTER — Ambulatory Visit: Payer: Self-pay | Admitting: Gastroenterology

## 2008-05-18 DIAGNOSIS — R110 Nausea: Secondary | ICD-10-CM | POA: Insufficient documentation

## 2008-05-18 DIAGNOSIS — R1013 Epigastric pain: Secondary | ICD-10-CM

## 2008-05-18 DIAGNOSIS — G8929 Other chronic pain: Secondary | ICD-10-CM | POA: Insufficient documentation

## 2008-05-18 DIAGNOSIS — K922 Gastrointestinal hemorrhage, unspecified: Secondary | ICD-10-CM | POA: Insufficient documentation

## 2008-05-18 LAB — CONVERTED CEMR LAB
ALT: 17 units/L (ref 0–35)
AST: 22 units/L (ref 0–37)
Albumin: 4 g/dL (ref 3.5–5.2)
Alkaline Phosphatase: 55 units/L (ref 39–117)
Amylase: 55 units/L (ref 27–131)
Bilirubin, Direct: 0.1 mg/dL (ref 0.0–0.3)
Lipase: 35 units/L (ref 11.0–59.0)
Total Bilirubin: 0.4 mg/dL (ref 0.3–1.2)
Total Protein: 6.5 g/dL (ref 6.0–8.3)

## 2008-05-22 ENCOUNTER — Telehealth: Payer: Self-pay | Admitting: Physician Assistant

## 2008-05-23 ENCOUNTER — Ambulatory Visit: Payer: Self-pay | Admitting: Family Medicine

## 2008-06-08 ENCOUNTER — Ambulatory Visit: Payer: Self-pay | Admitting: Internal Medicine

## 2008-06-11 ENCOUNTER — Ambulatory Visit: Payer: Self-pay | Admitting: Family Medicine

## 2008-06-11 ENCOUNTER — Encounter (INDEPENDENT_AMBULATORY_CARE_PROVIDER_SITE_OTHER): Payer: Self-pay | Admitting: Internal Medicine

## 2008-06-11 LAB — CONVERTED CEMR LAB
Basophils Absolute: 0 10*3/uL (ref 0.0–0.1)
Basophils Relative: 0 % (ref 0–1)
Calcium: 10 mg/dL (ref 8.4–10.5)
Eosinophils Absolute: 0 10*3/uL (ref 0.0–0.7)
Eosinophils Relative: 0 % (ref 0–5)
HCT: 38.6 % (ref 36.0–46.0)
Hemoglobin: 11.9 g/dL — ABNORMAL LOW (ref 12.0–15.0)
Lymphocytes Relative: 26 % (ref 12–46)
Lymphs Abs: 1.9 10*3/uL (ref 0.7–4.0)
MCHC: 30.8 g/dL (ref 30.0–36.0)
MCV: 75.2 fL — ABNORMAL LOW (ref 78.0–100.0)
Monocytes Absolute: 0.6 10*3/uL (ref 0.1–1.0)
Monocytes Relative: 7 % (ref 3–12)
Neutro Abs: 5.1 10*3/uL (ref 1.7–7.7)
Neutrophils Relative %: 66 % (ref 43–77)
Platelets: 347 10*3/uL (ref 150–400)
RBC: 5.13 M/uL — ABNORMAL HIGH (ref 3.87–5.11)
RDW: 13.5 % (ref 11.5–15.5)
WBC: 7.6 10*3/uL (ref 4.0–10.5)

## 2008-06-12 ENCOUNTER — Encounter (INDEPENDENT_AMBULATORY_CARE_PROVIDER_SITE_OTHER): Payer: Self-pay | Admitting: Internal Medicine

## 2008-06-12 LAB — CONVERTED CEMR LAB
Magnesium: 2 mg/dL (ref 1.5–2.5)
Potassium: 4.3 meq/L (ref 3.5–5.3)

## 2008-06-17 IMAGING — CR DG CHEST 2V
2 series · 2 of 2 positions shown · non-contrast
Comparison: none

CLINICAL DATA: Pulmonary nodule.  Short of breath.  Follow-up.
 TWO VIEW CHEST:
 The right perihilar lung nodule has not changed significantly compared to CT chest of 04/16/06.  No new lung nodule is seen.  No effusion is noted.  The heart is within upper limits of normal.

[w chest pa]
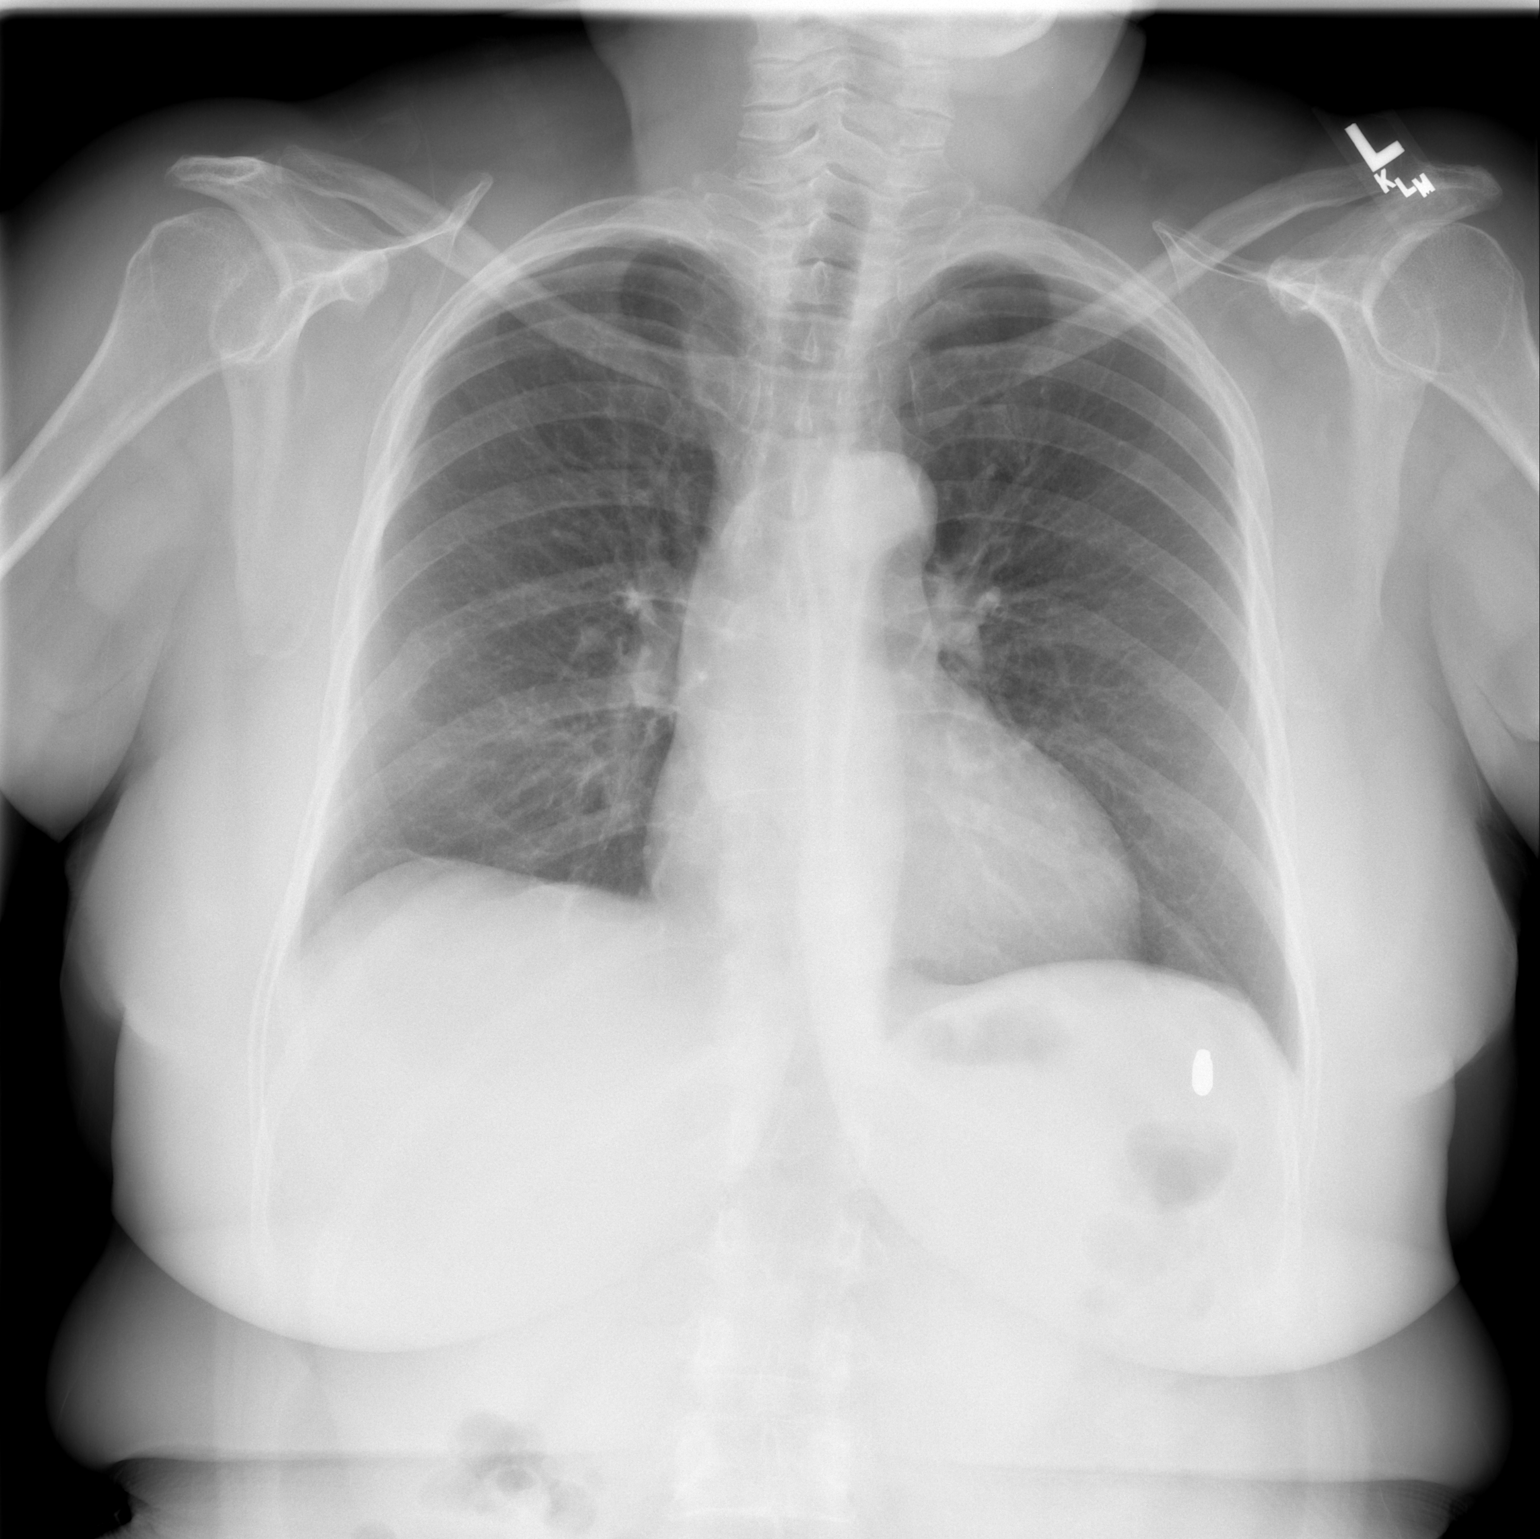

[w chest lat]
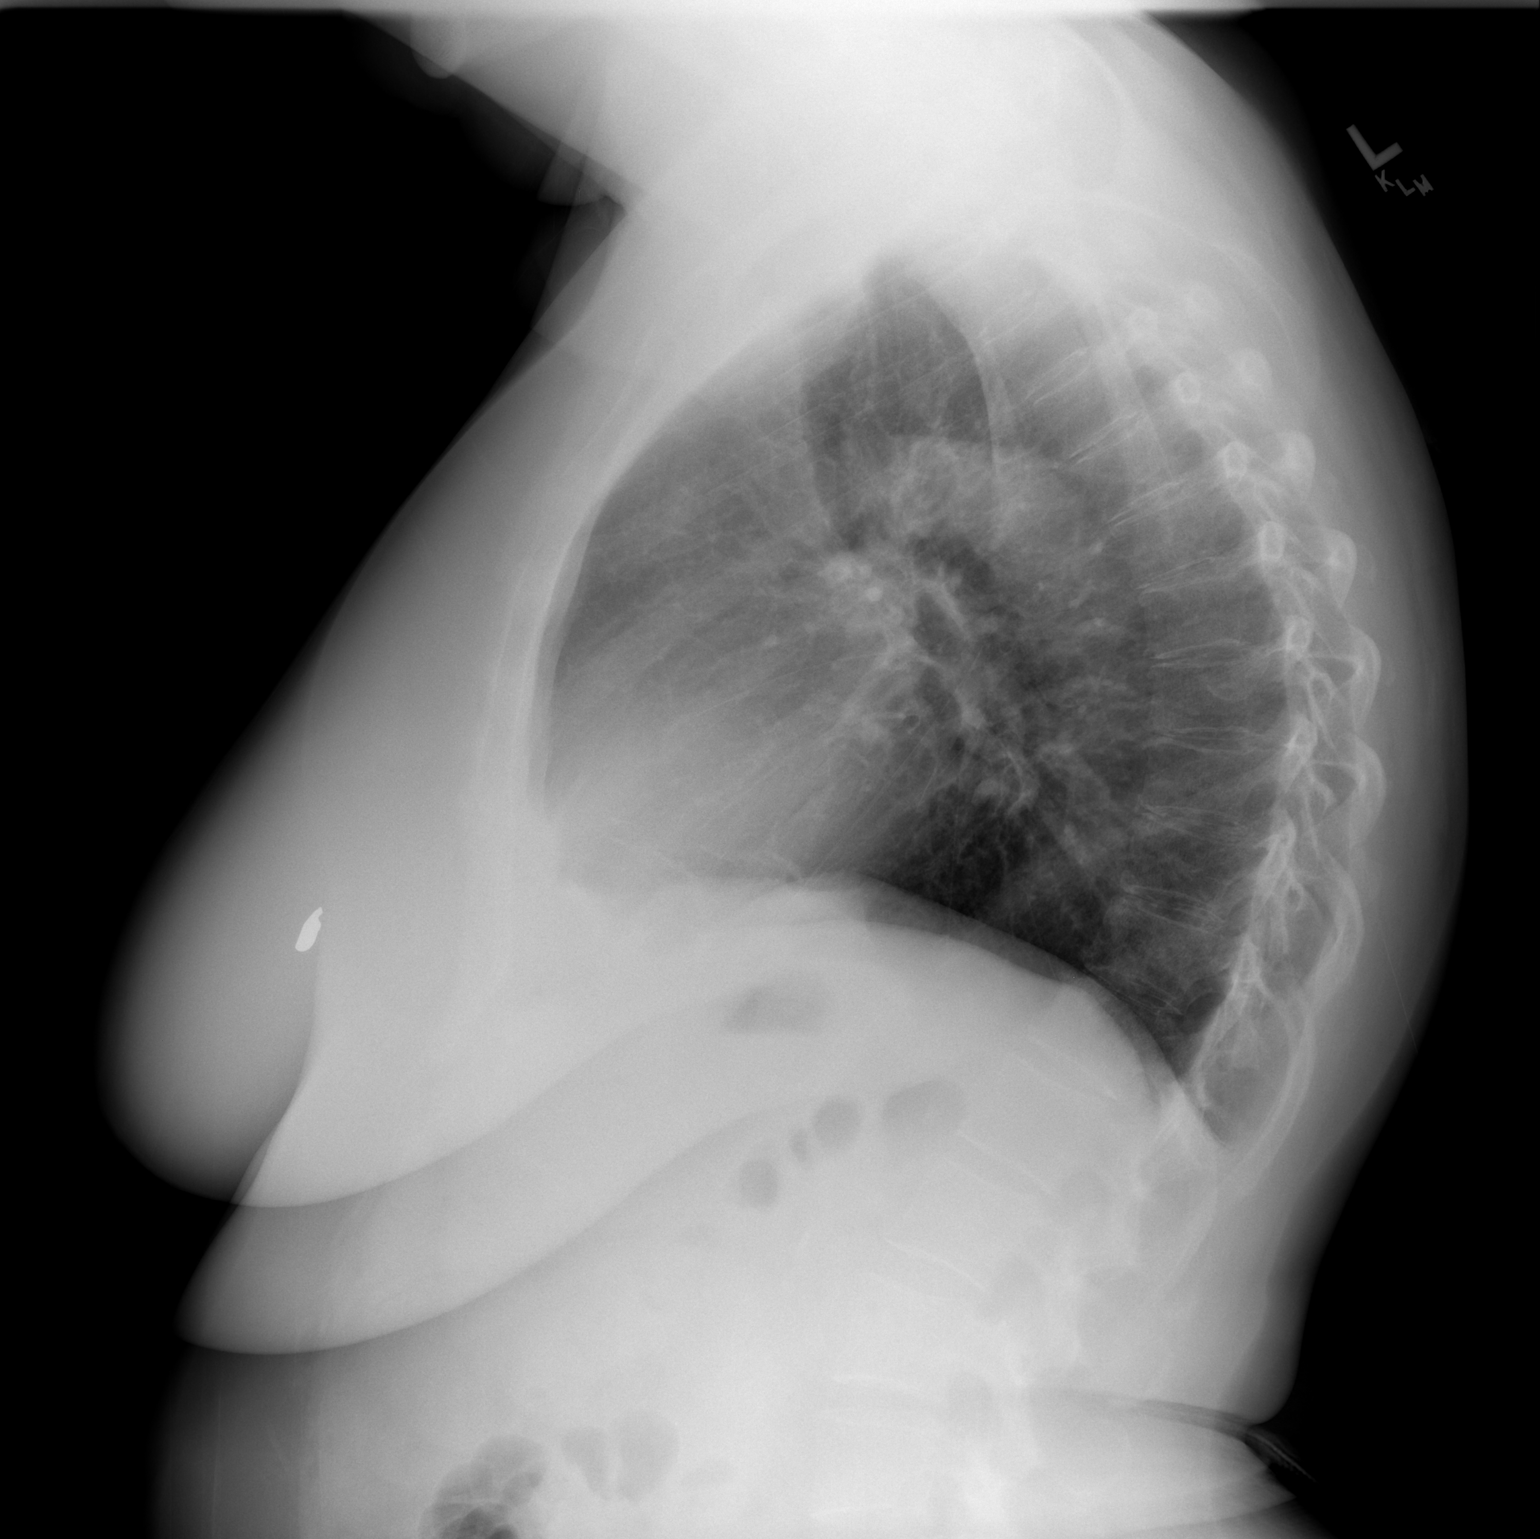

[2 of 2 positions shown; findings below may reference images not displayed]

IMPRESSION: Stable right perihilar lung nodule.  No new abnormality.

## 2008-06-21 ENCOUNTER — Encounter: Payer: Self-pay | Admitting: Internal Medicine

## 2008-06-25 ENCOUNTER — Encounter: Admission: RE | Admit: 2008-06-25 | Discharge: 2008-09-23 | Payer: Self-pay | Admitting: Internal Medicine

## 2008-06-26 ENCOUNTER — Ambulatory Visit: Payer: Self-pay | Admitting: Internal Medicine

## 2008-06-29 ENCOUNTER — Telehealth: Payer: Self-pay | Admitting: Internal Medicine

## 2008-07-03 ENCOUNTER — Telehealth: Payer: Self-pay | Admitting: Internal Medicine

## 2008-07-07 IMAGING — CR DG CHEST 2V
2 series · 2 of 2 positions shown · non-contrast
Comparison: 01/26/07.

CLINICAL DATA: Preop for hiatal hernia surgery.
 CHEST - 2 VIEW:

[view not recorded (1 of 2)]
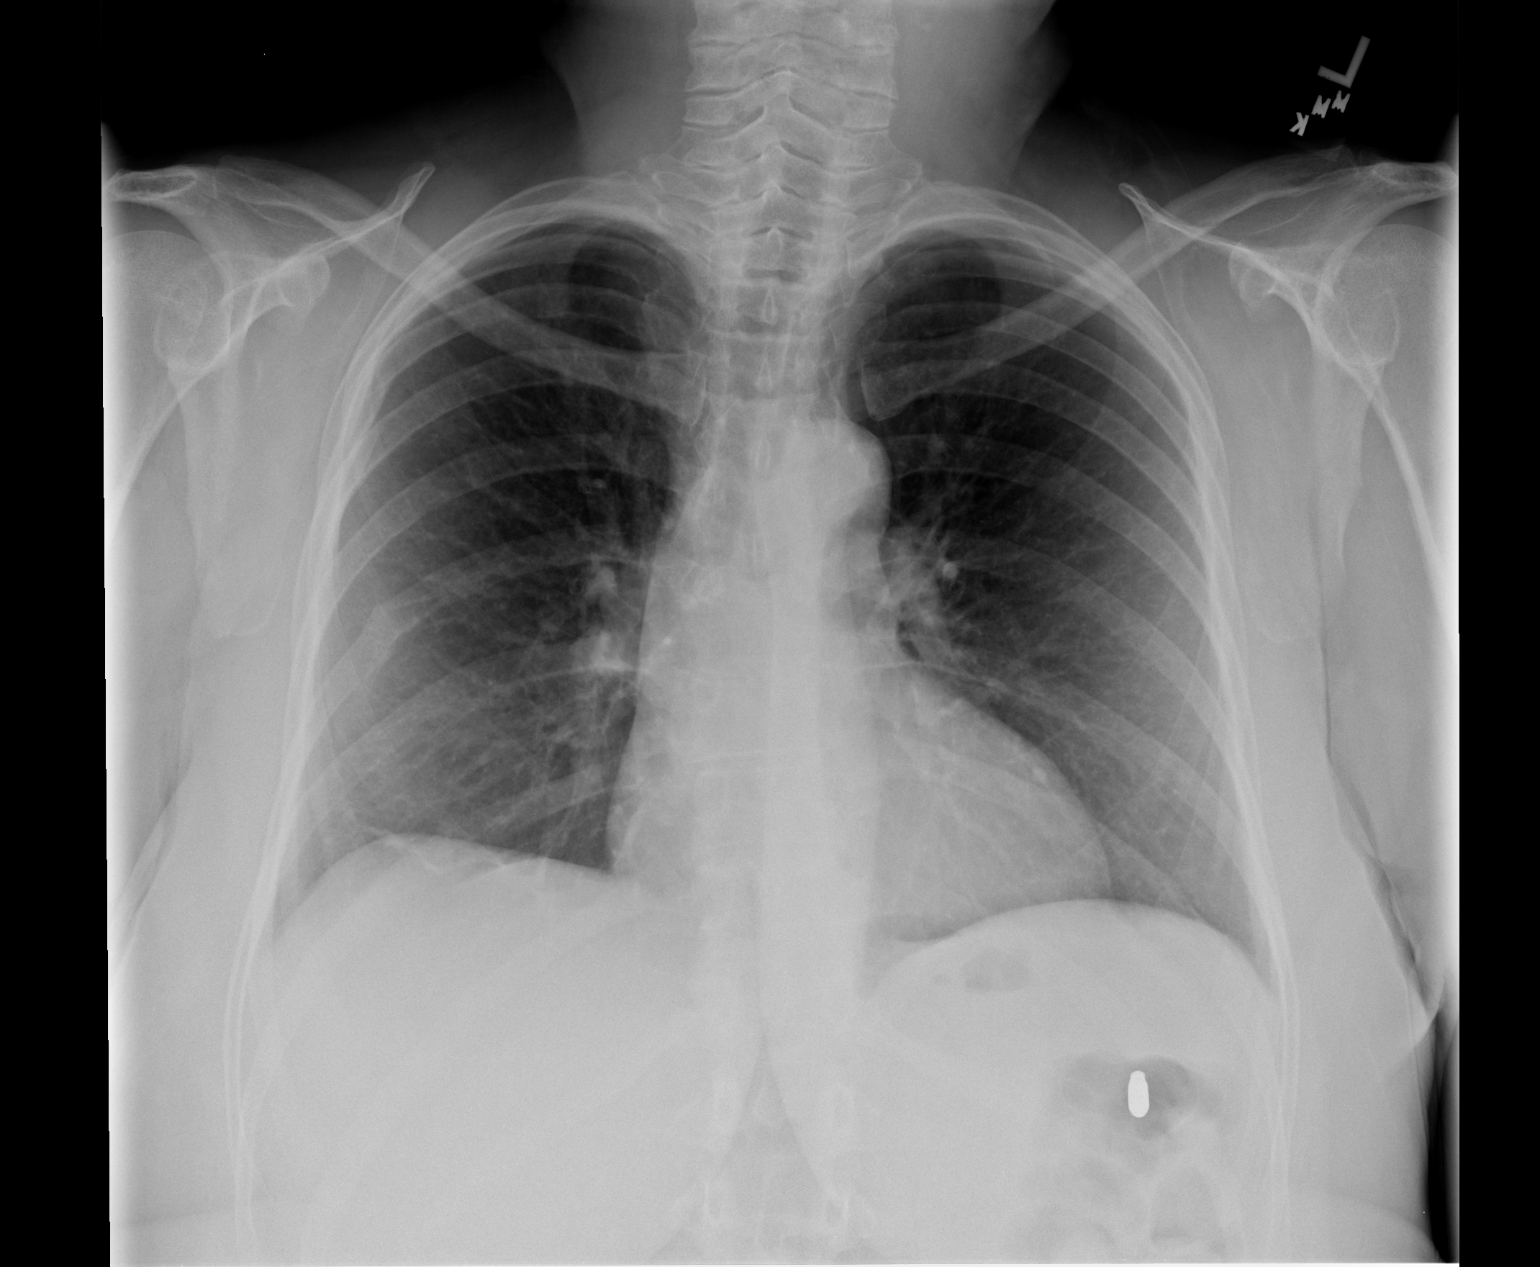

[view not recorded (2 of 2)]
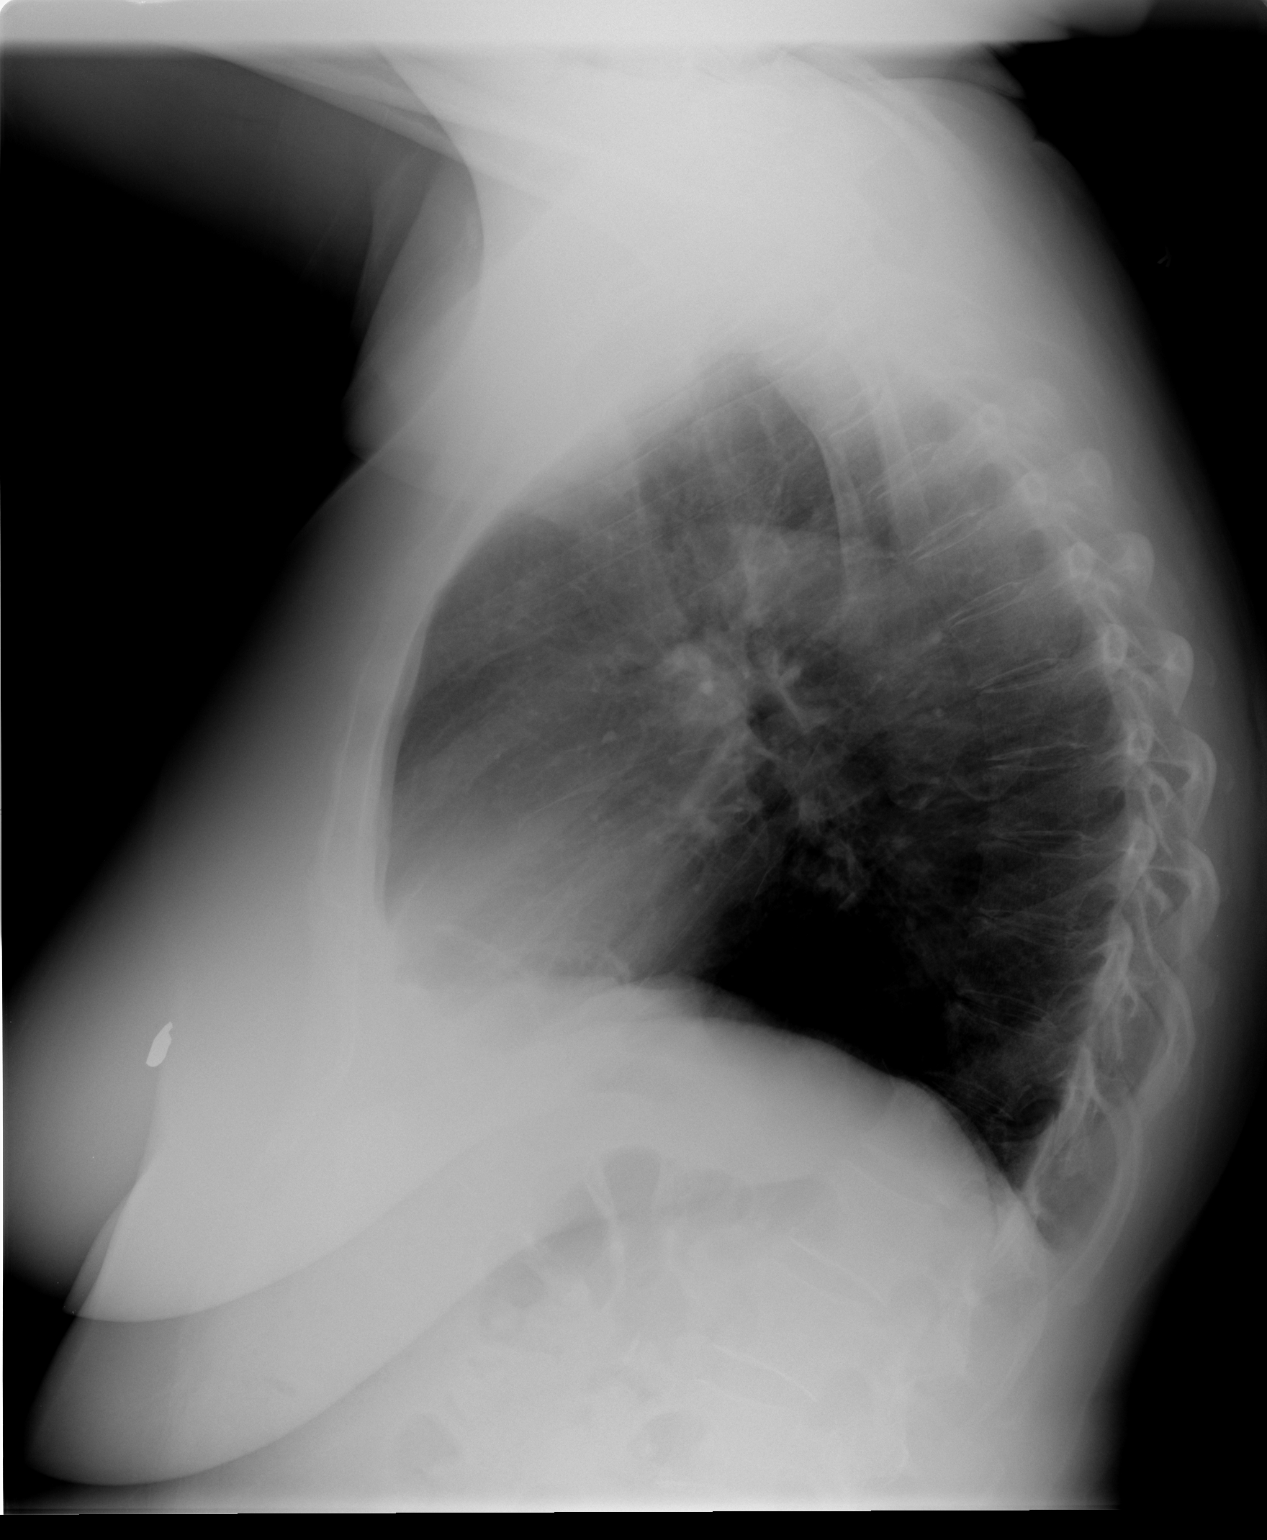

[2 of 2 positions shown; findings below may reference images not displayed]

FINDINGS: The right perihilar nodule noted previously is not as well seen.  No active infiltrate or effusion is seen.  The heart is within normal limits in size.  Foreign body is noted in the left breast, probably representing a metallic foreign body.
IMPRESSION: No active lung disease.  Probable no change in small right perihilar nodule.
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
## 2008-07-10 IMAGING — CR DG CHEST 1V PORT
1 series · 1 of 1 positions shown · non-contrast
Comparison: 02/15/07.

CLINICAL DATA: Left thoracotomy.
PORTABLE CHEST - 1 VIEW:

[view not recorded]
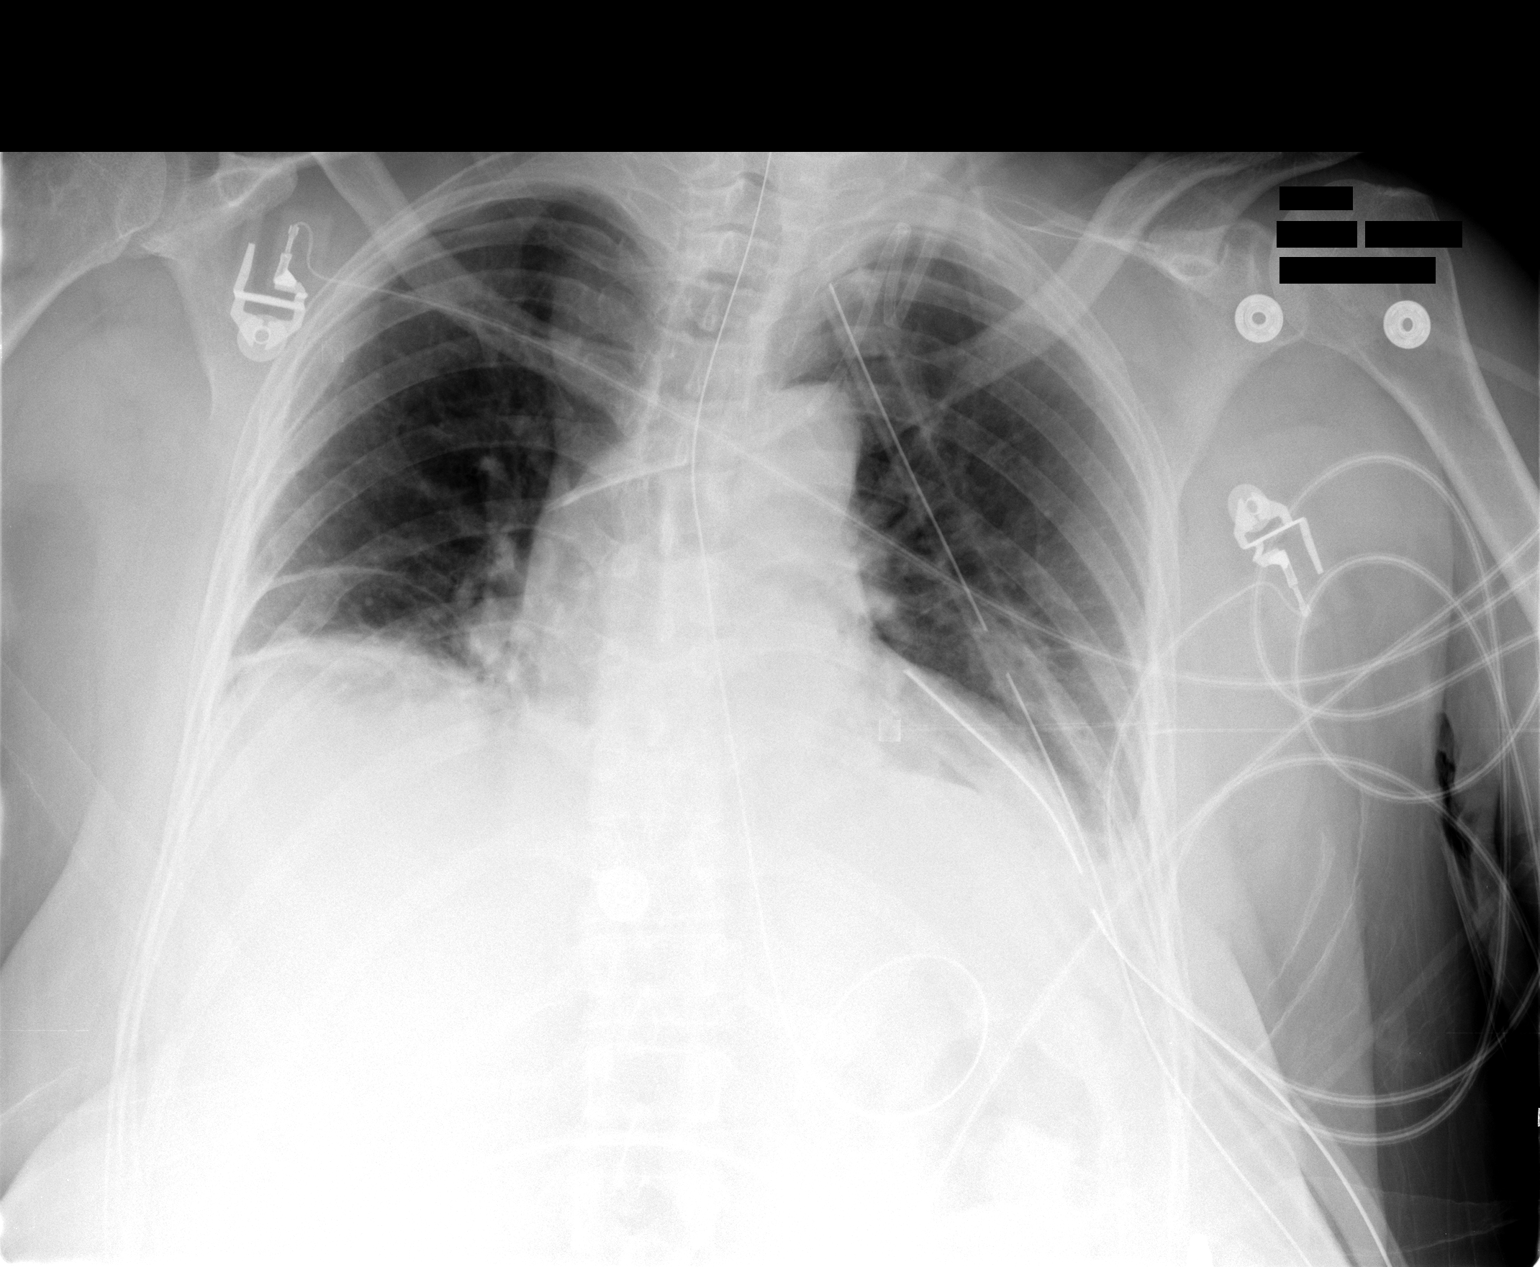

[1 of 1 positions shown; findings below may reference images not displayed]

FINDINGS: Portable view of the chest demonstrates a nasogastric tube which is coiled in the stomach and the tip is probably in the gastric body although the tip is just beyond the film. There are 2 left chest tubes without a large pneumothorax. A left IJ central venous catheter terminates in the proximal SVC region. The patient has markedly low lung volumes with compressive atelectasis in the lung bases.  The cardiac silhouette is obscured by the low lung volumes.
IMPRESSION: 1.  Postop changes without pneumothorax.
2.  Very low lung volumes with basilar atelectasis.

## 2008-07-11 IMAGING — CR DG CHEST 1V PORT
1 series · 1 of 1 positions shown · non-contrast
Comparison: 02/18/07.

CLINICAL DATA: 66-year-old with left thoracotomy.
PORTABLE CHEST ? 1 VIEW ? 02/19/07:

[view not recorded]
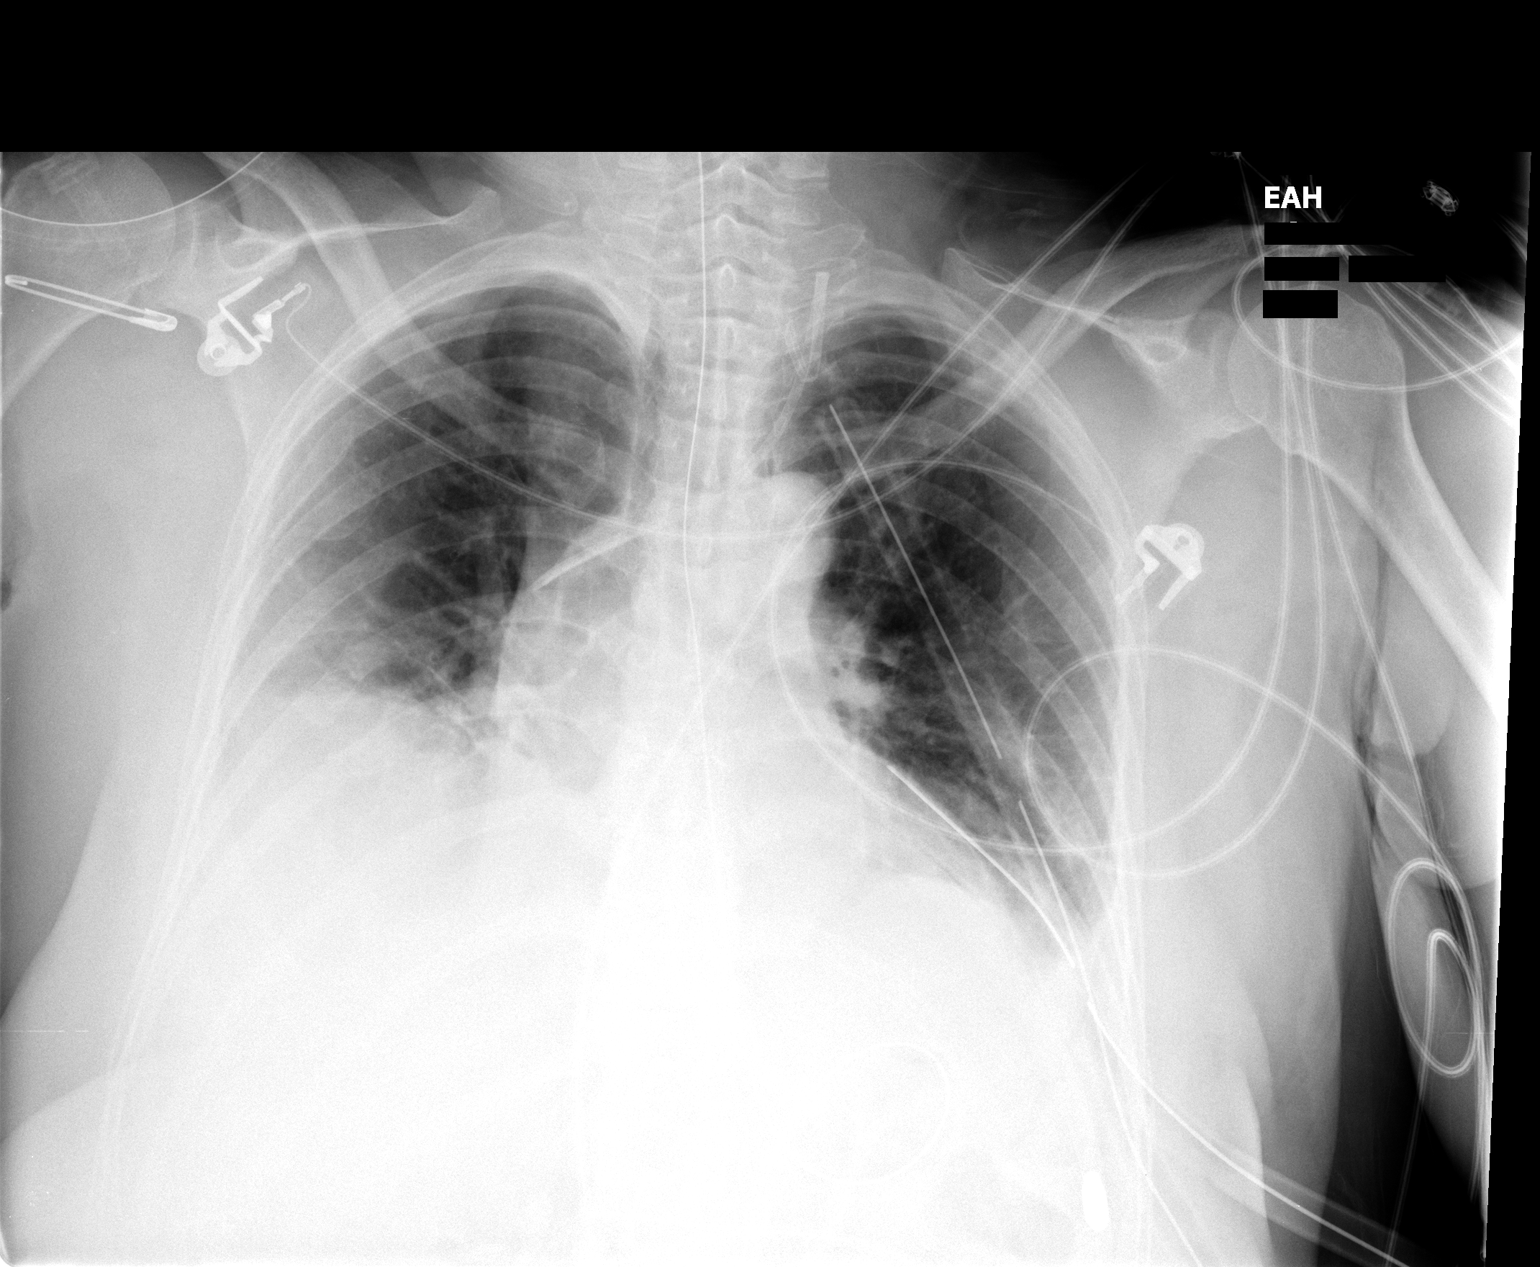

[1 of 1 positions shown; findings below may reference images not displayed]

FINDINGS: There is a nasogastric tube coiled in the stomach.  Two left chest tubes are in stable position and no evidence for large pneumothorax.  The patient continues to have very low lung volumes and probable small pleural effusions.  Heart and mediastinum are grossly stable.  The left central venous catheter tip is in the region of the proximal SVC.
IMPRESSION: Low lung volumes with pleural fluid.

## 2008-07-12 IMAGING — CR DG CHEST 1V PORT
1 series · 1 of 1 positions shown · non-contrast
Comparison: 02/19/2007

CLINICAL DATA: History of recurrent hiatal hernia. 
 PORTABLE CHEST 02/20/2007:

[view not recorded]
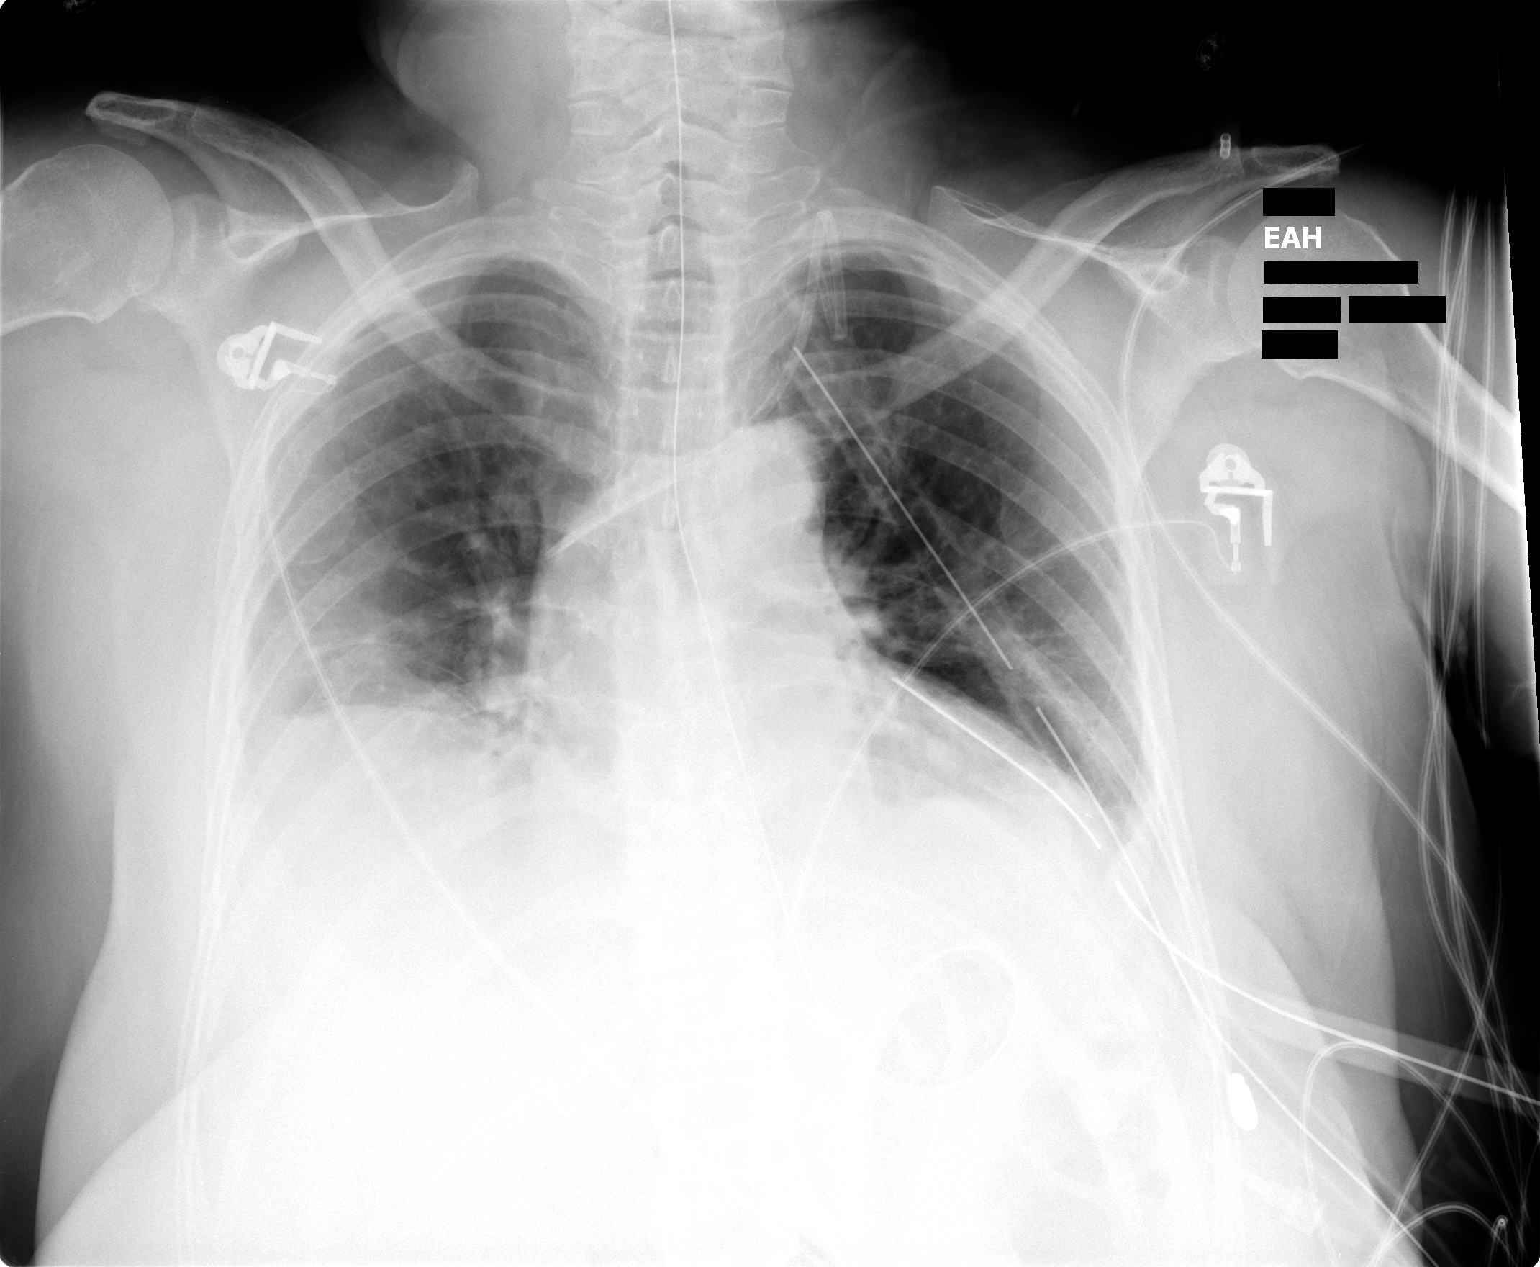

[1 of 1 positions shown; findings below may reference images not displayed]

FINDINGS: Portable view of the chest again demonstrates left chest tubes, nasogastric tube, and left IJ central venous catheter.  Stable position of support apparatuses.  The lungs remain stable with low volume volumes and elevation of the right hemidiaphragm.  Probable basilar atelectasis and pleural fluid. No definite pneumothorax.
IMPRESSION: Minimal change from the previous exam as described.

## 2008-07-13 IMAGING — CR DG CHEST 1V PORT
1 series · 1 of 1 positions shown · non-contrast
Comparison: 02/20/07.

CLINICAL DATA: History of recurrent hiatal hernia with reflux.  Postoperative chest.  Follow-up aeration.  
 PORTABLE CHEST ? 1 VIEW ? 02/21/07 ? 2022 HOURS:

[view not recorded]
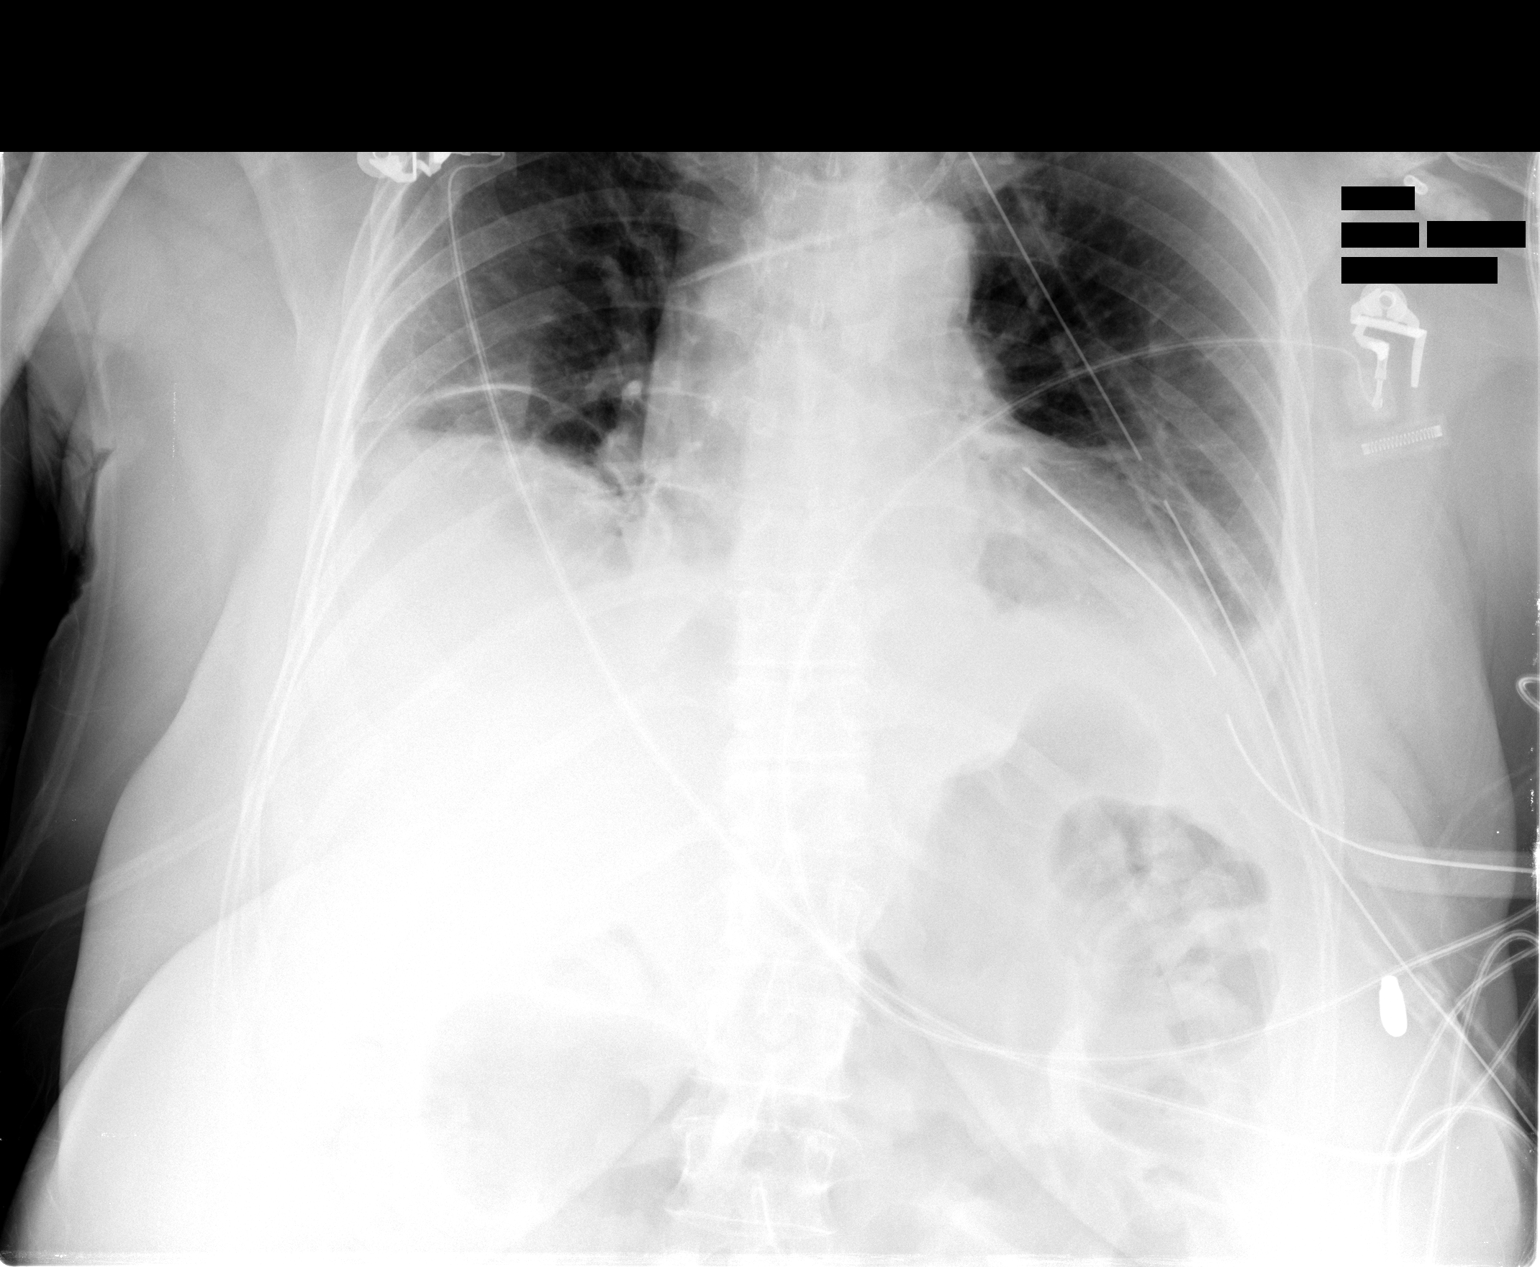

[1 of 1 positions shown; findings below may reference images not displayed]

FINDINGS: Left-sided chest tubes remain in satisfactory position.  There is no definite pneumothorax.  There are bibasilar atelectatic changes ? stable.  The NG tube has been removed.  Central venous catheter is unchanged in position.
IMPRESSION: Removal of NG tube.  Bibasilar atelectasis ? unchanged.  No pneumothorax.

## 2008-07-13 IMAGING — CR DG ESOPHAGUS
1 series · 1 of 1 positions shown · non-contrast
Comparison: none

CLINICAL DATA: Patient status post Nissen fundoplication repair.
ESOPHAGRAM ? 02/21/07:

[view not recorded]
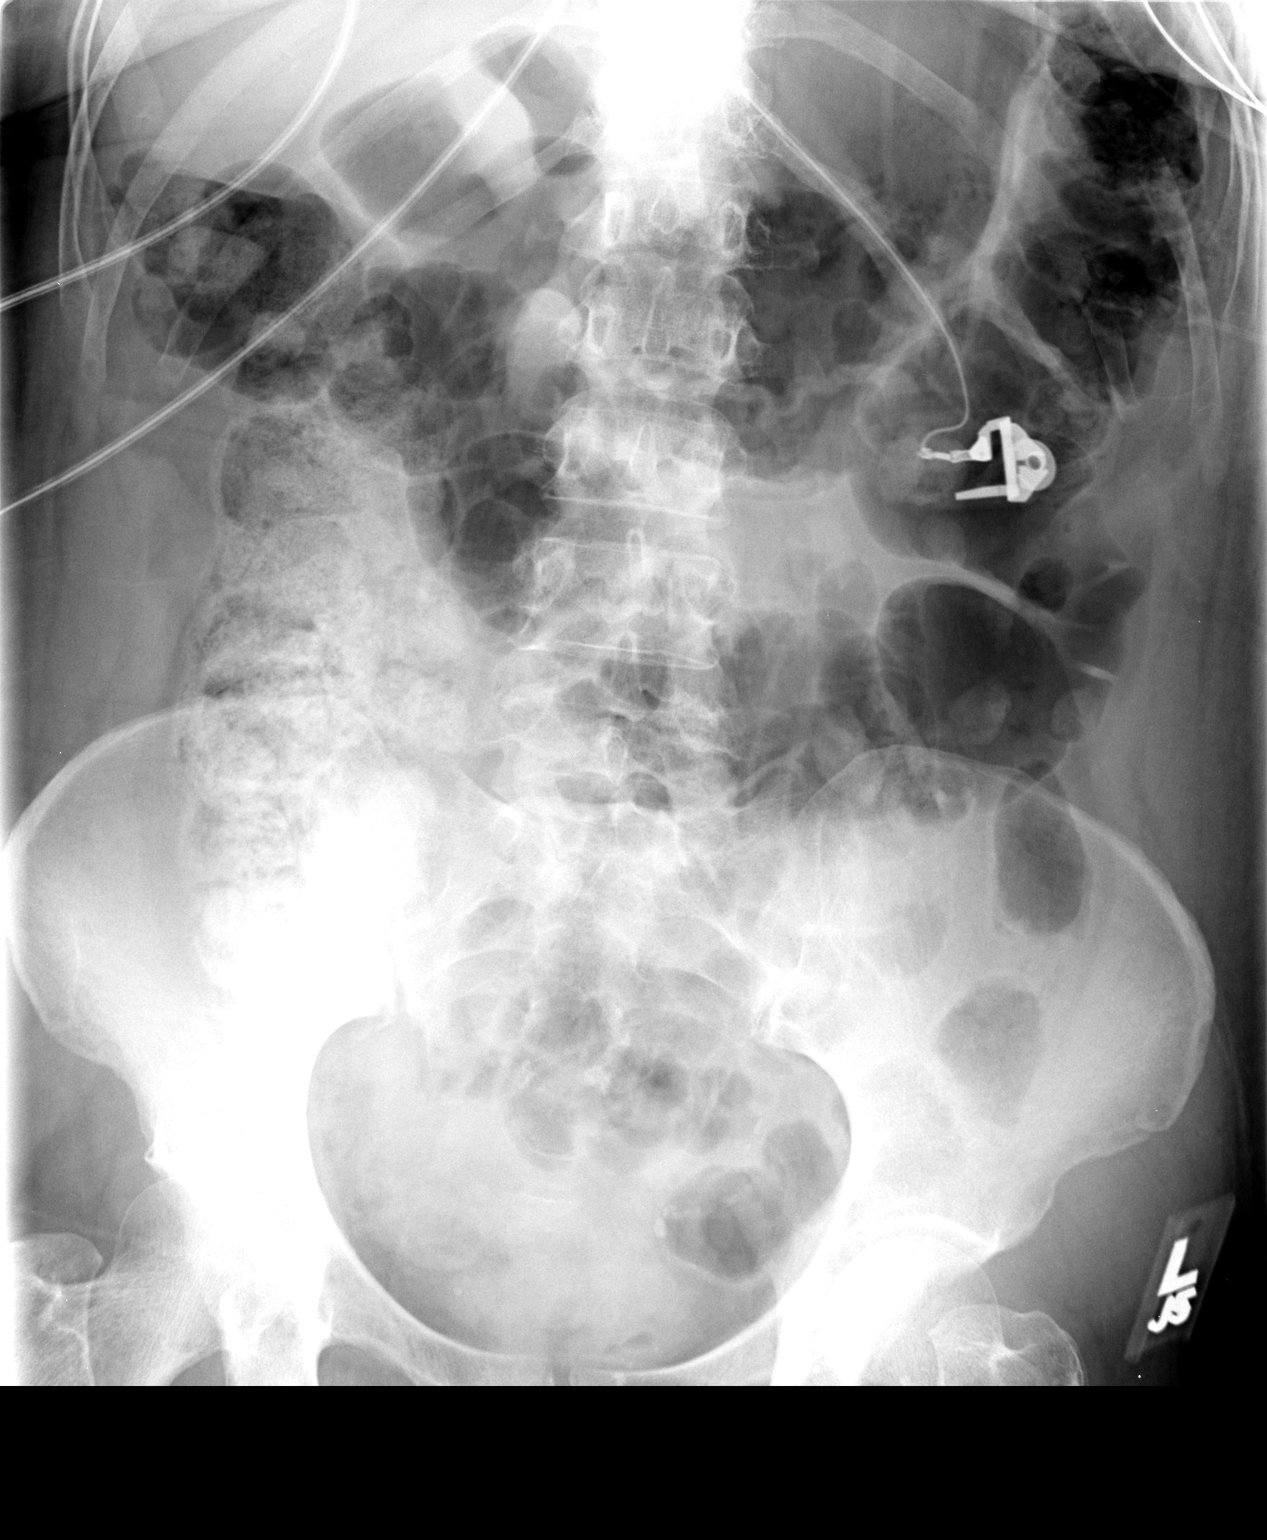

[1 of 1 positions shown; findings below may reference images not displayed]

FINDINGS: The patient was administered oral Omnipaque under fluoroscopic observation.  There is narrowing through the gastroesophageal junction consistent with fundoplication repair.  There is no evidence of extraluminal leakage of the oral contrast.  Contrast passes into the stomach with some residual activity in the esophagus.
IMPRESSION: 1.  No evidence of leak at the gastroesophageal junction.
2.  Narrowing through the gastroesophageal junction consistent with postoperative fundoplication repair.

## 2008-07-14 IMAGING — CR DG CHEST 1V PORT
1 series · 1 of 1 positions shown · non-contrast
Comparison: 02/21/07.

CLINICAL DATA: Hiatal hernia.  
 PORTABLE CHEST ? 1 VIEW:

[view not recorded]
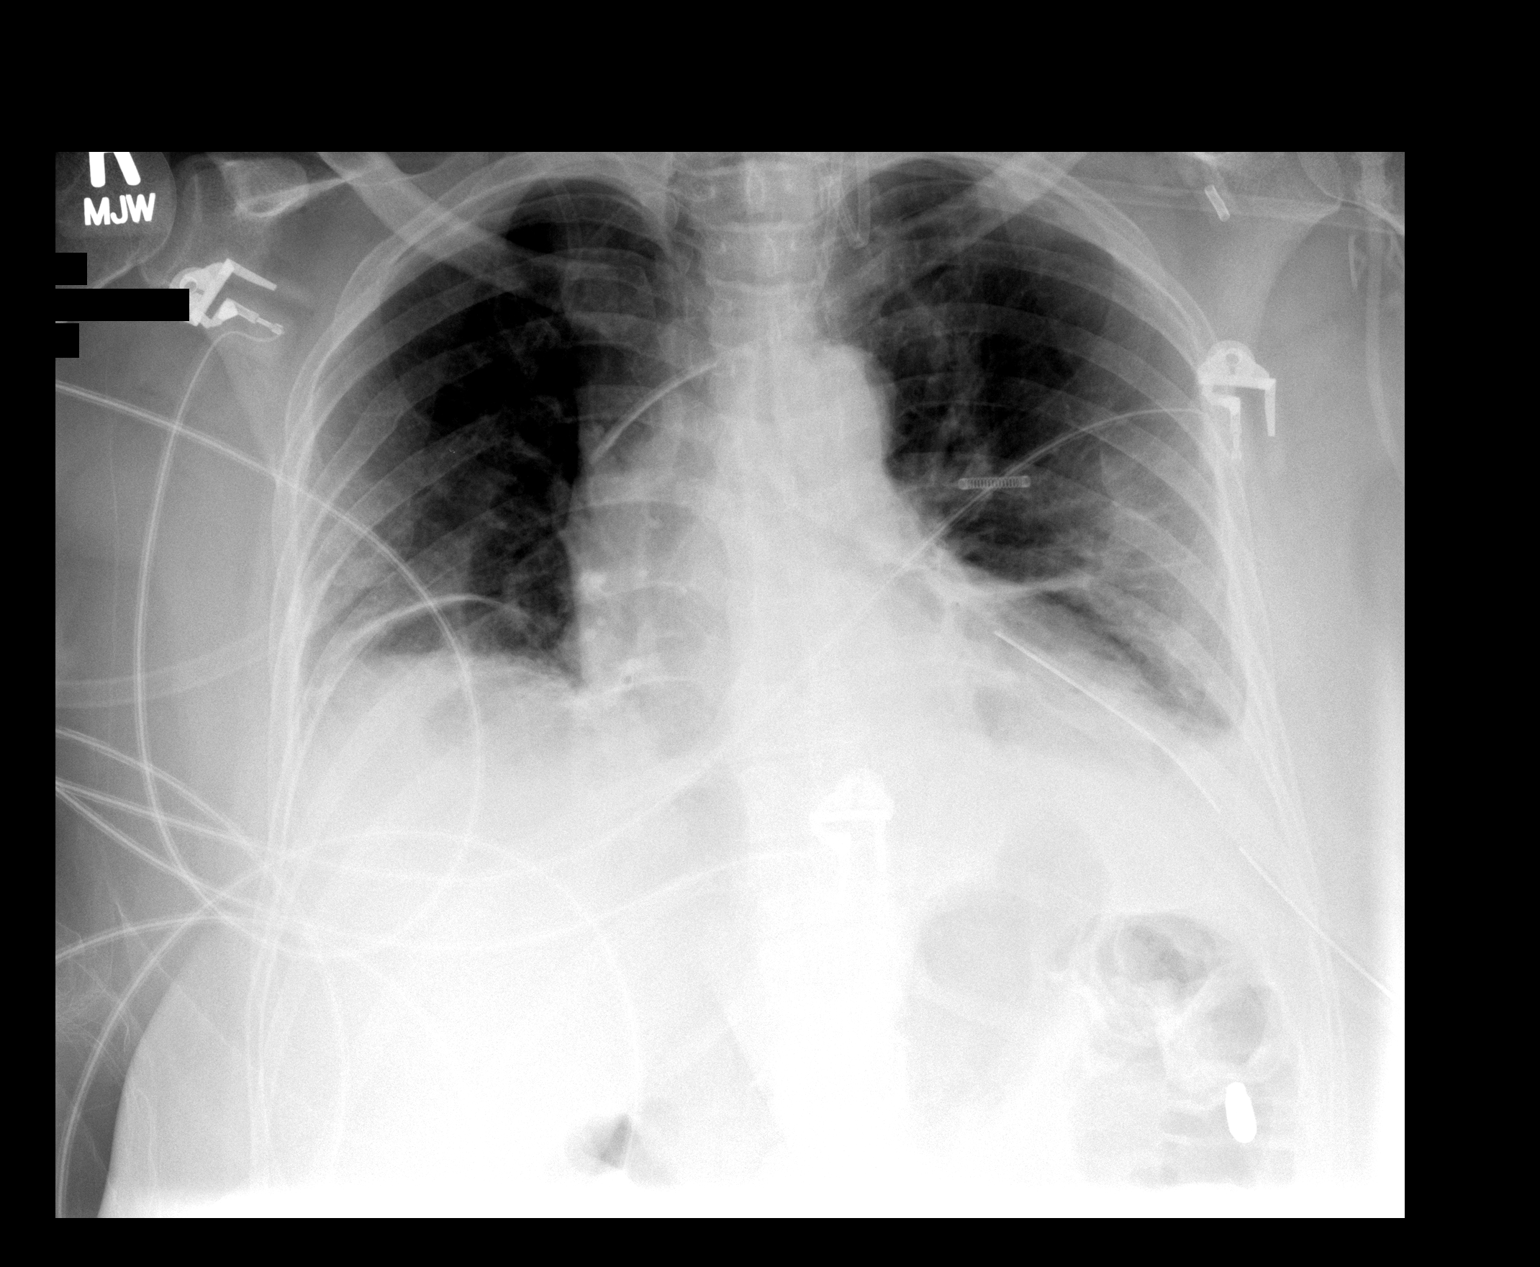

[1 of 1 positions shown; findings below may reference images not displayed]

FINDINGS: Bibasilar atelectasis, a left central venous catheter, and a left thoracostomy tube are again noted.  Cardiomediastinal silhouette is stable.  Small left effusion is again identified.  There is no evidence of pneumothorax.  There has been little interval change since prior study.
IMPRESSION: Stable chest.

## 2008-07-15 IMAGING — CR DG CHEST 1V PORT
1 series · 1 of 1 positions shown · non-contrast
Comparison: none

CLINICAL DATA: Chest tube removal.  
PORTABLE CHEST ? VIEW ([DATE] A.M.):

[view not recorded]
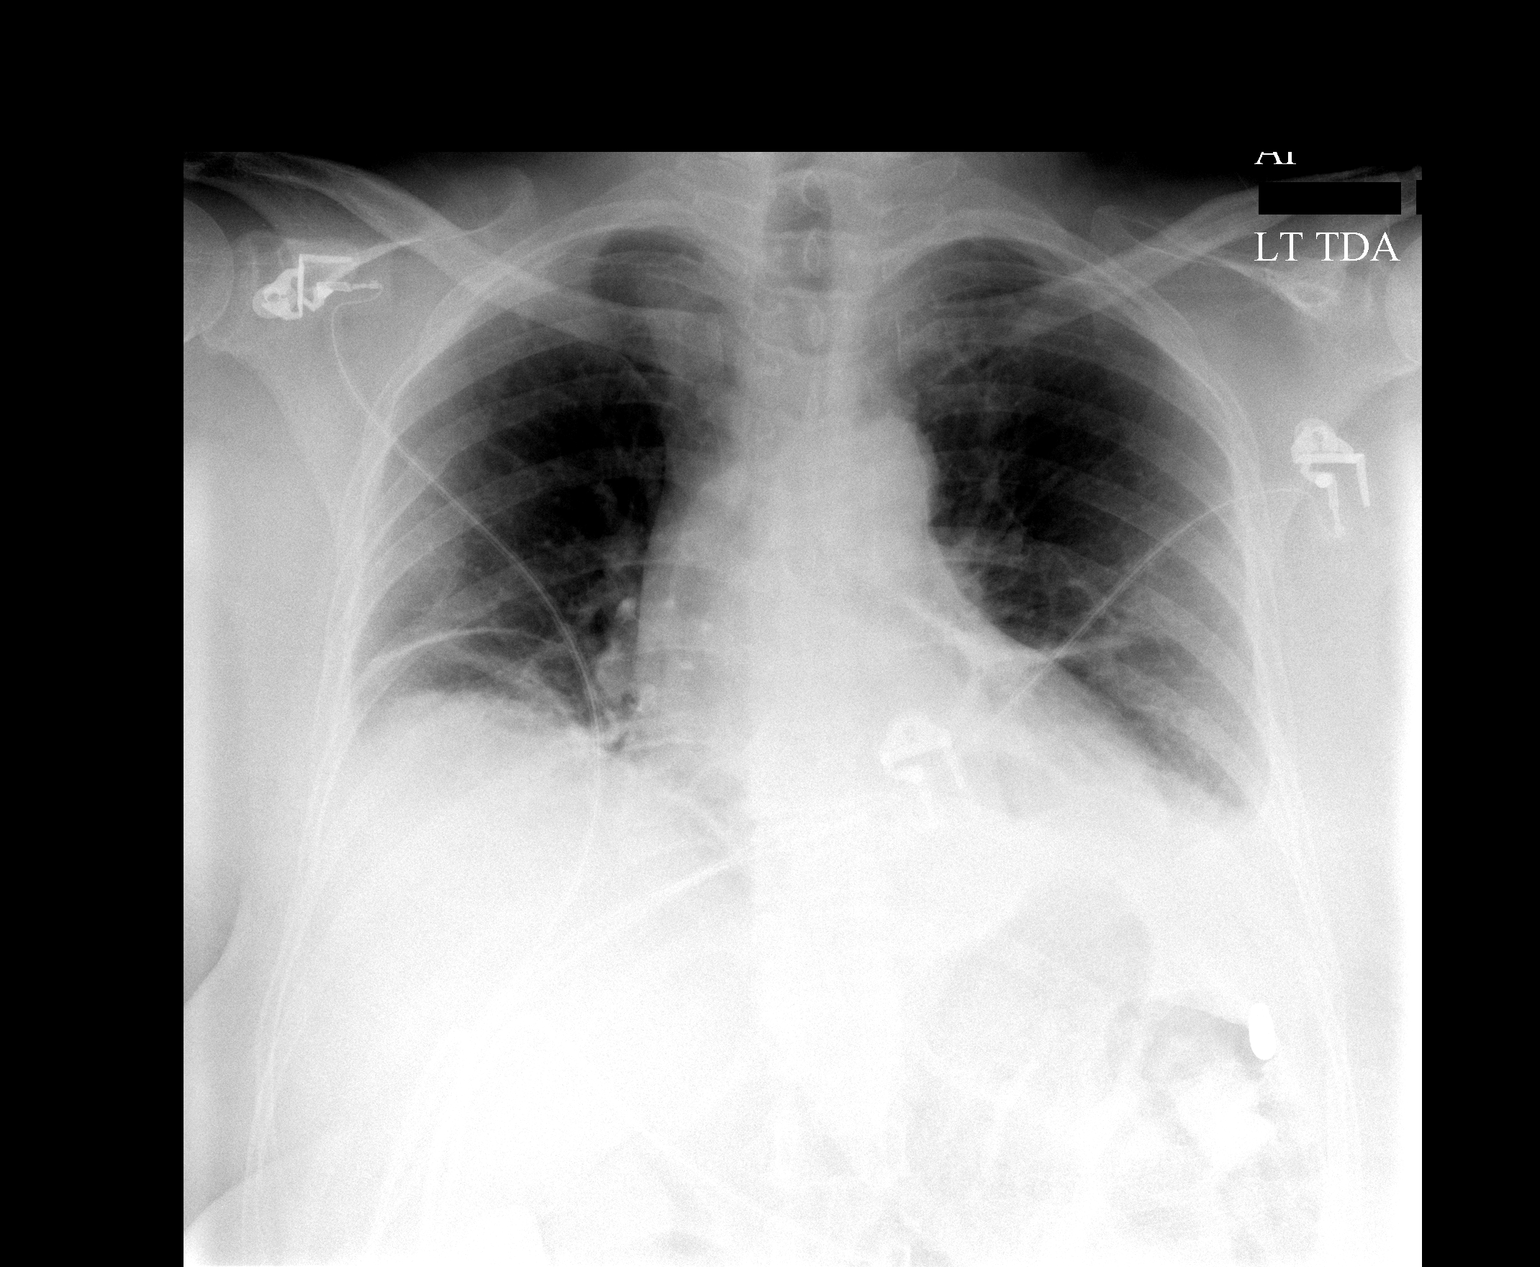

[1 of 1 positions shown; findings below may reference images not displayed]

FINDINGS: Left-sided chest tube and left central line have been removed.  There remains a tiny left apical pneumothorax unchanged.  Bibasilar subsegmental atelectatic changes and small pleural effusions.  Poor inspiration with mild pulmonary vascular prominence.  Cardiomegaly.
IMPRESSION: Left-sided chest tube and left central line have been removed.  There remains a tiny left apical pneumothorax.  Remainder of findings unchanged.

## 2008-07-15 IMAGING — CR DG CHEST 1V PORT
1 series · 1 of 1 positions shown · non-contrast
Comparison: 02/22/07.

CLINICAL DATA: Hiatal hernia. 
 PORTABLE CHEST - 1 VIEW, 02/23/07, 6566 HOURS:

[view not recorded]
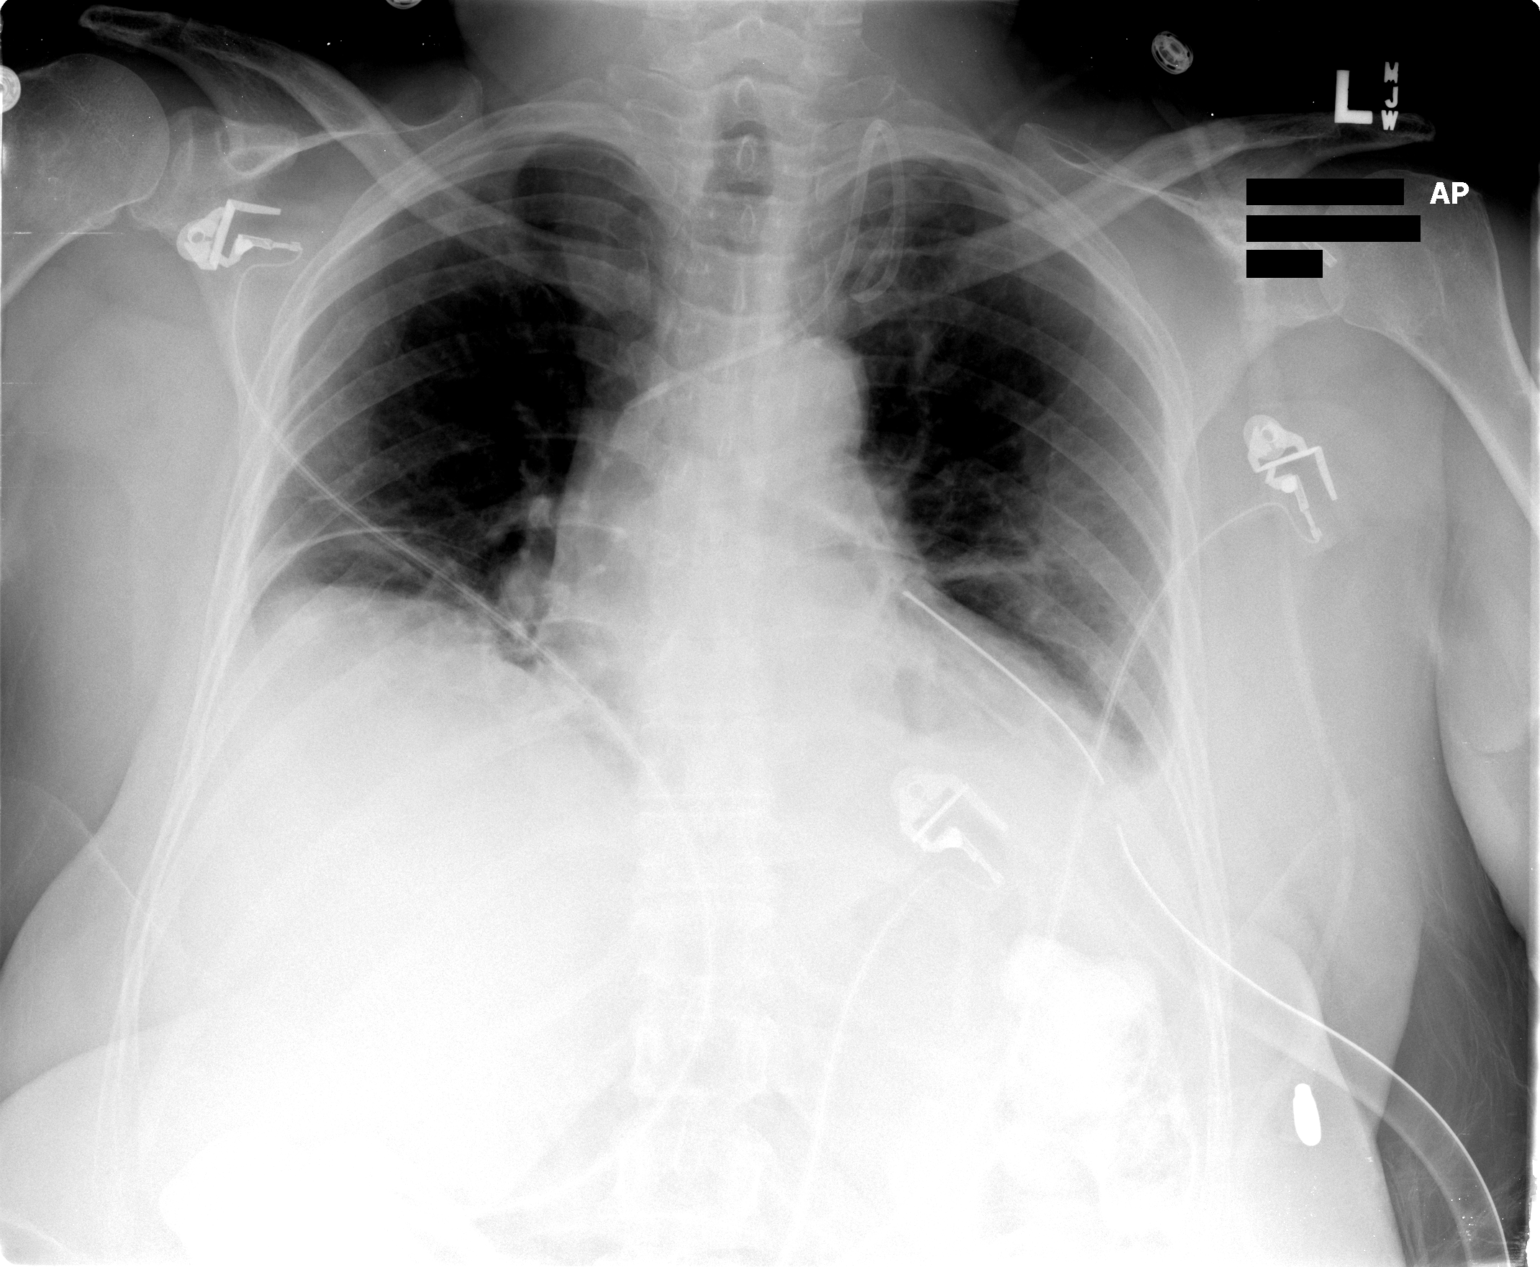

[1 of 1 positions shown; findings below may reference images not displayed]

FINDINGS: The left IJ vein central venous catheter and left chest tube are stable.  Bibasilar atelectasis is stable.  A tiny left apical pneumothorax is noted, better visualized than on the prior study.  No right pneumothorax.  The heart is normal in size.
IMPRESSION: Tiny left pneumothorax is visualized.  Otherwise, stable.

## 2008-07-16 IMAGING — CR DG CHEST 2V
2 series · 2 of 2 positions shown · non-contrast
Comparison: Yesterday.

CLINICAL DATA: Chest pain. Status post hiatal hernia repair.

CHEST - 2 VIEW

[w chest pa]
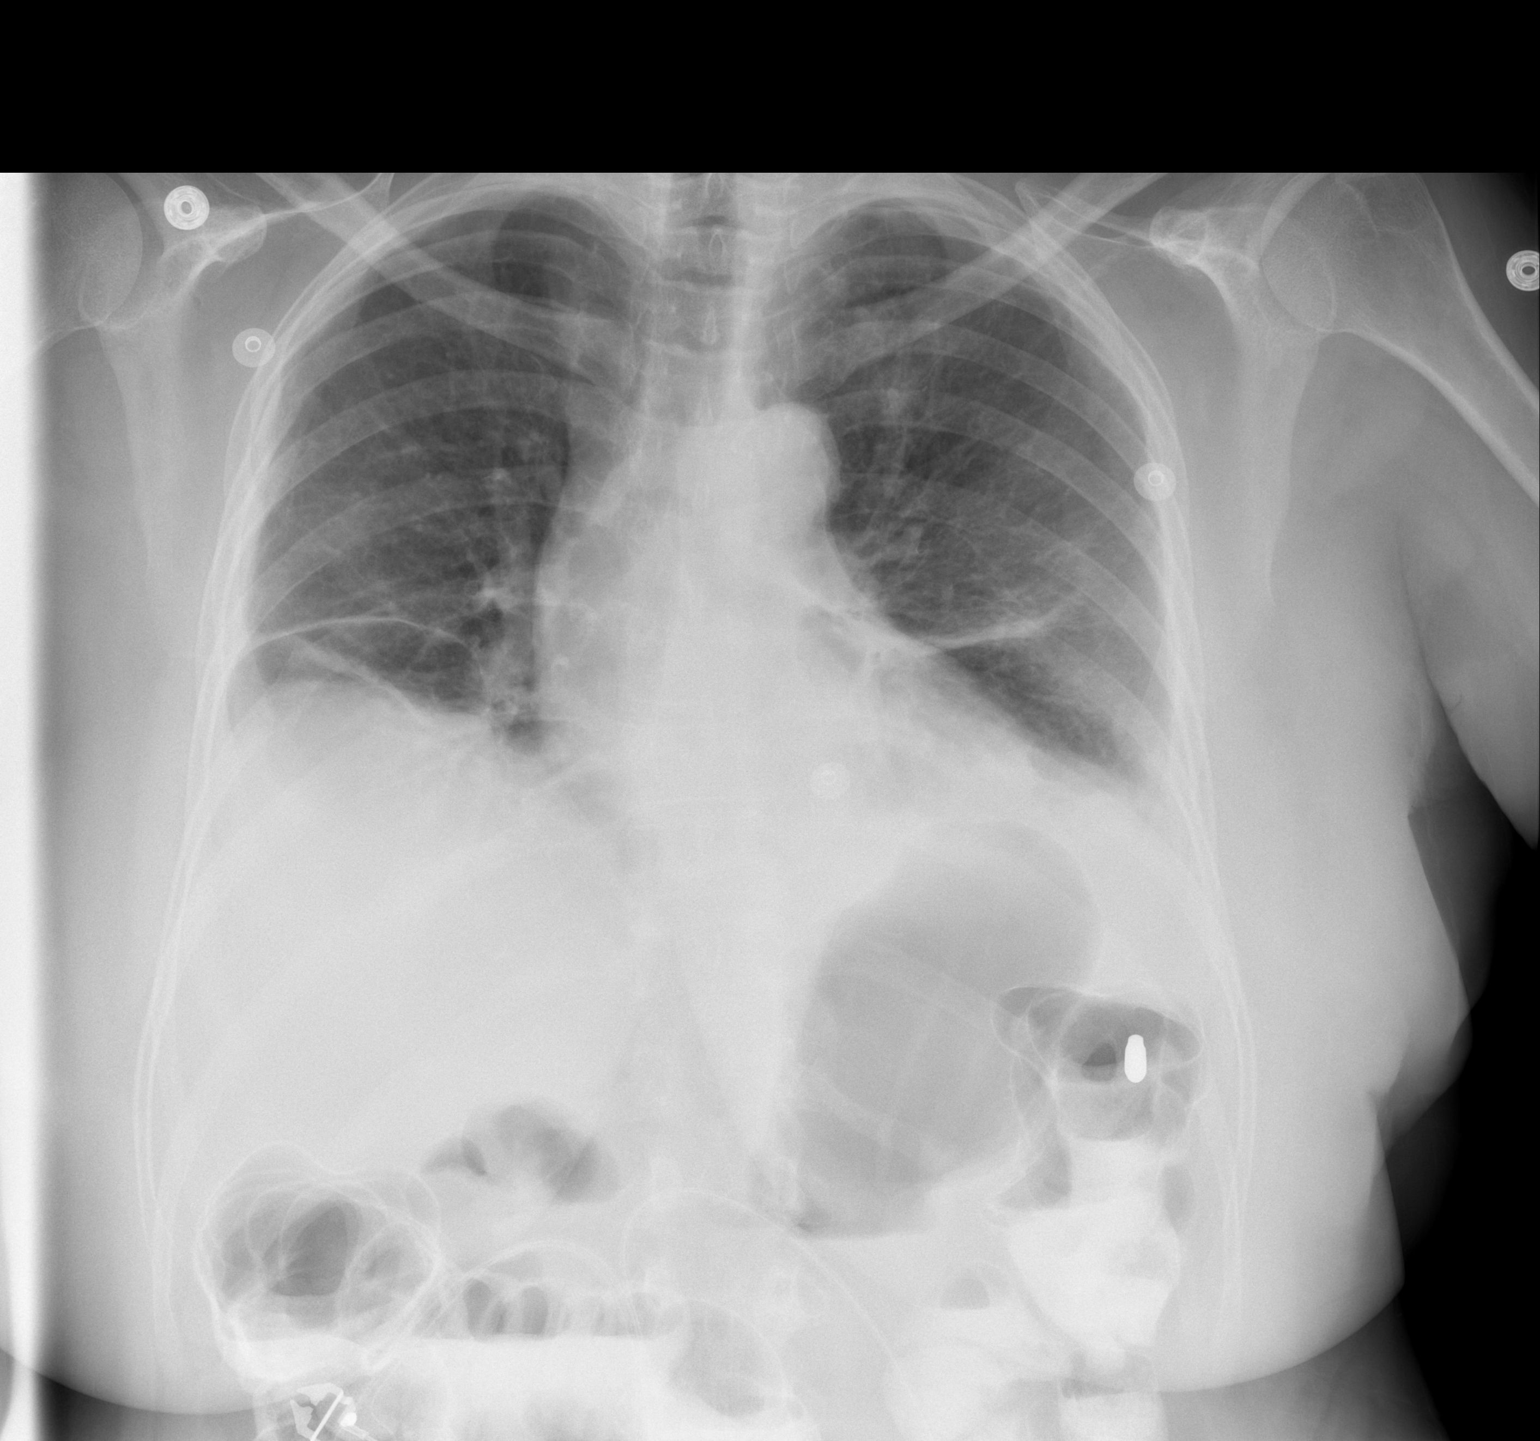

[w chest lat]
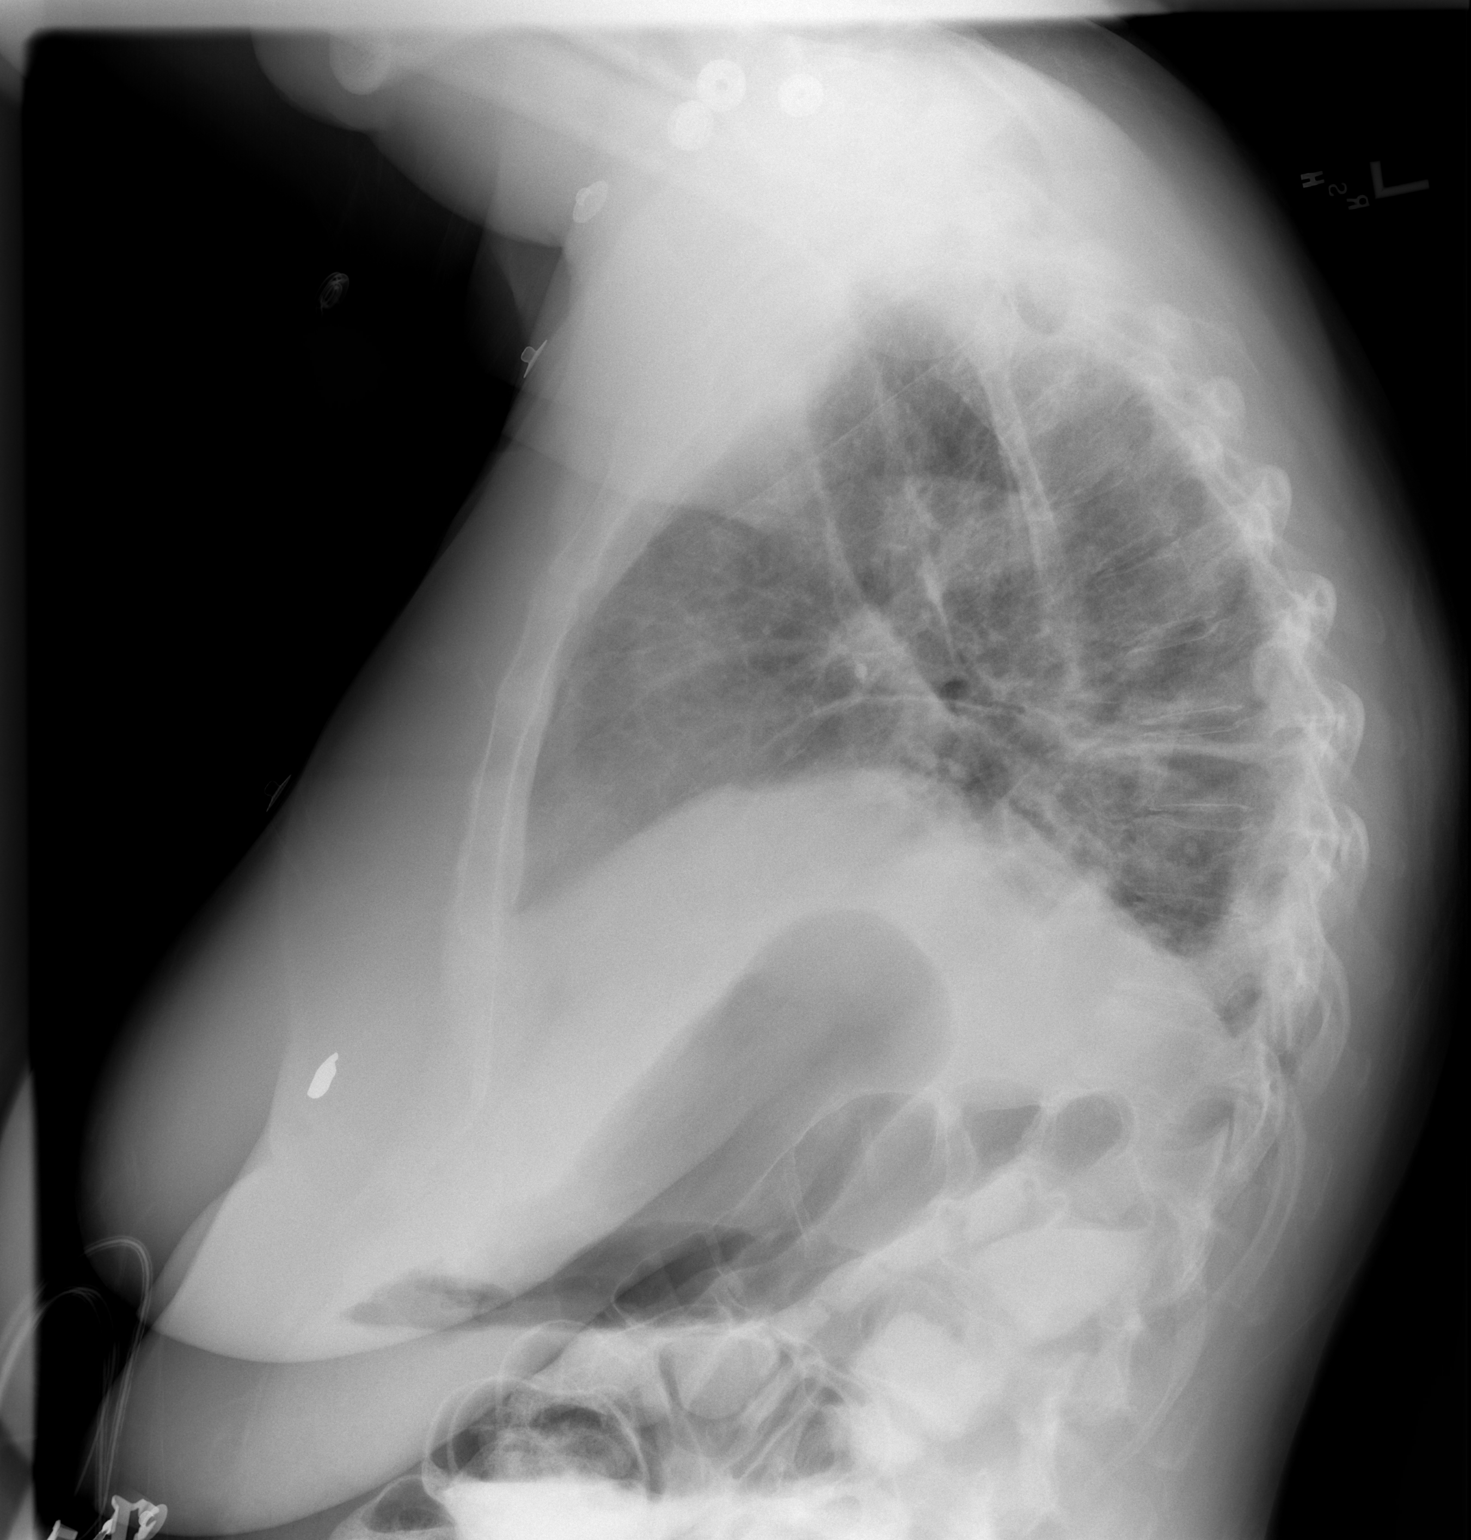

[2 of 2 positions shown; findings below may reference images not displayed]

FINDINGS: Stable tiny left apical pneumothorax. Stable bibasilar atelectasis
and normal sized heart. Stable metallic density overlying the left upper
abdomen.

IMPRESSION

1. Stable tiny left apical pneumothorax.
2. Stable bibasilar atelectasis.

## 2008-07-17 IMAGING — CR DG CHEST 2V
2 series · 2 of 2 positions shown · non-contrast
Comparison: 02/24/07.

CLINICAL DATA: 66-year-old female, recurrent hiatal hernia with reflux.
 CHEST - 2 VIEW:

[w chest pa]
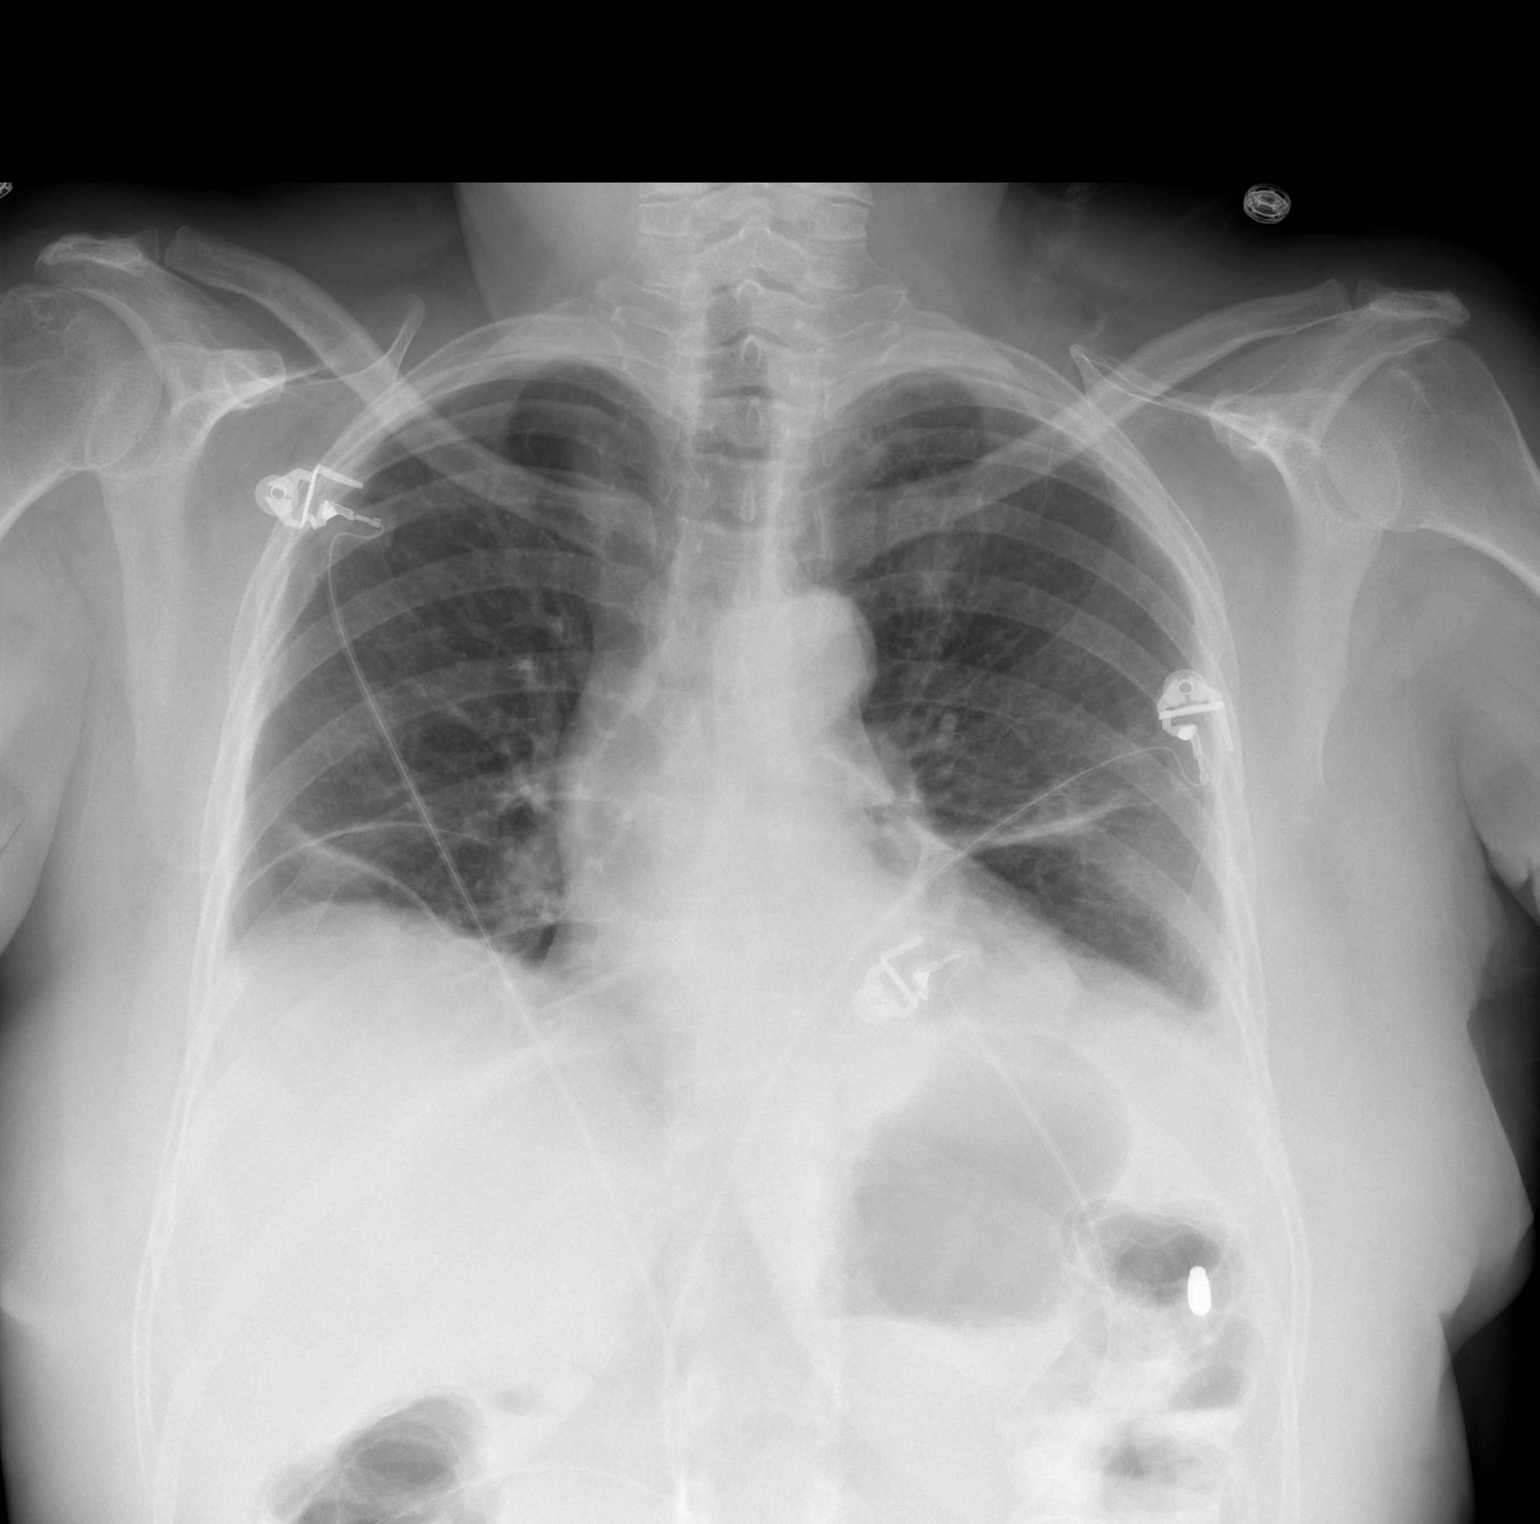

[w chest lat]
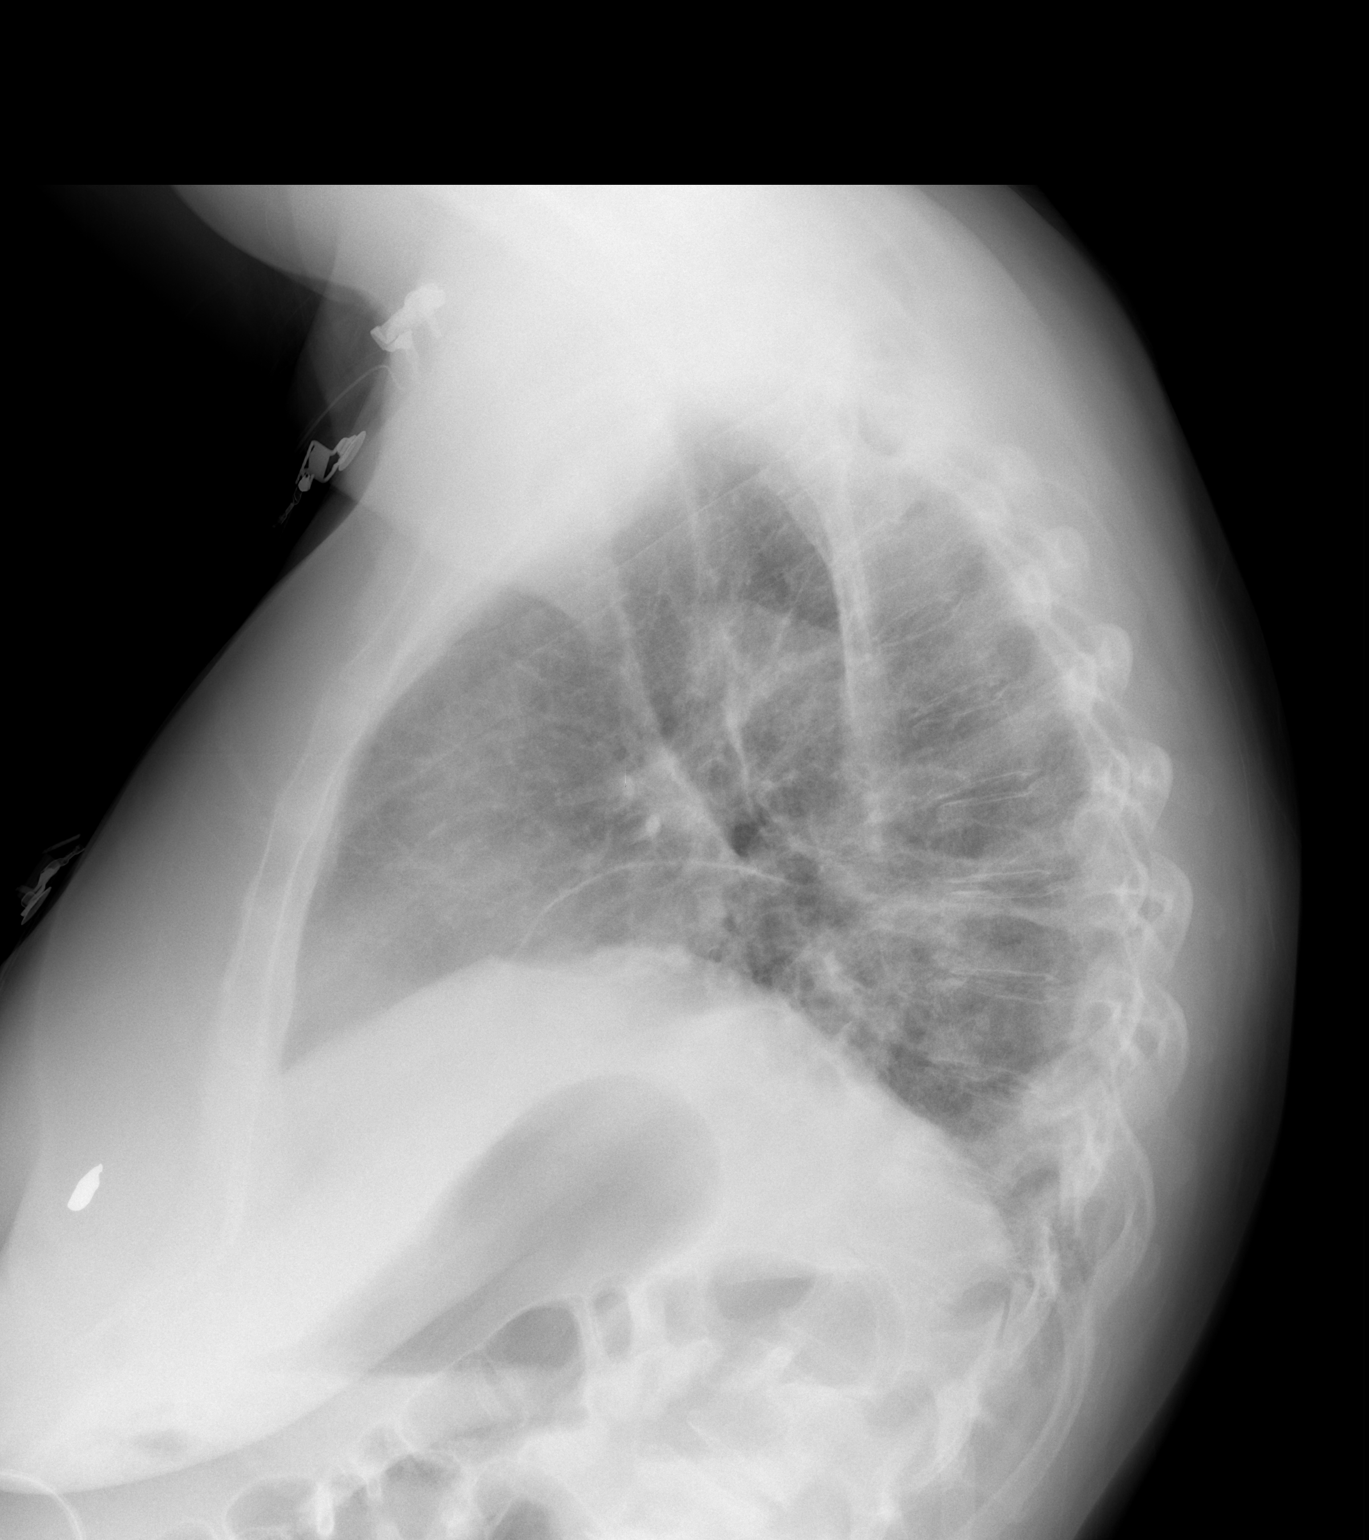

[2 of 2 positions shown; findings below may reference images not displayed]

FINDINGS: The cardio-pericardial silhouette is within normal limits for size.  Lung volumes are low.  Small bilateral pleural effusions and subsegmental atelectasis is unchanged.  Lung volumes remain low.
IMPRESSION: No significant interval change in small effusions and bibasilar atelectasis.

## 2008-07-23 IMAGING — CR DG CHEST 2V
2 series · 2 of 2 positions shown · non-contrast
Comparison: 02/25/07.

CLINICAL DATA: 67-year-old female recurrent hiatal hernia with reflux.  Recent hiatal hernia repair.  Followup exam. 
 CHEST ? 2 VIEW:

[w chest pa]
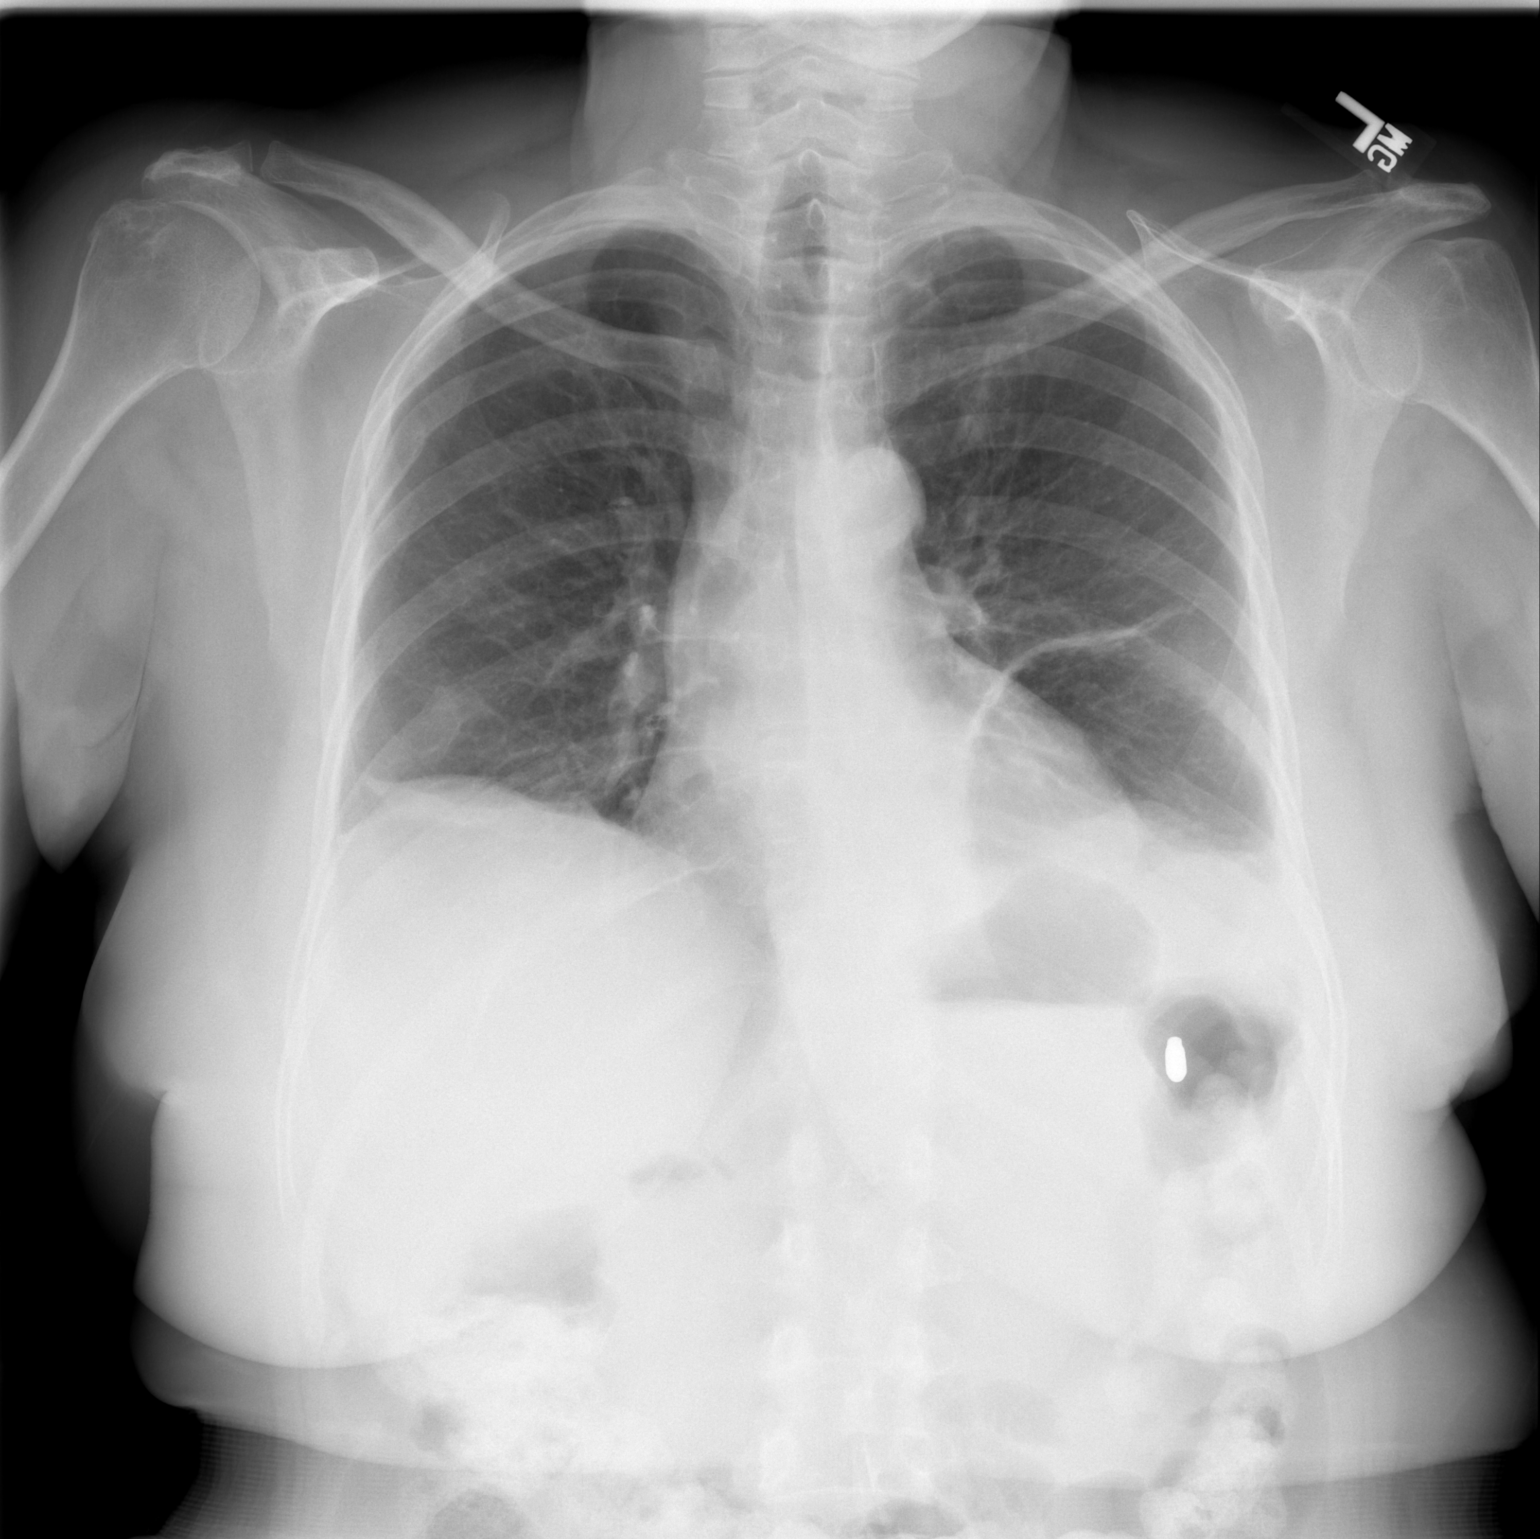

[w chest lat]
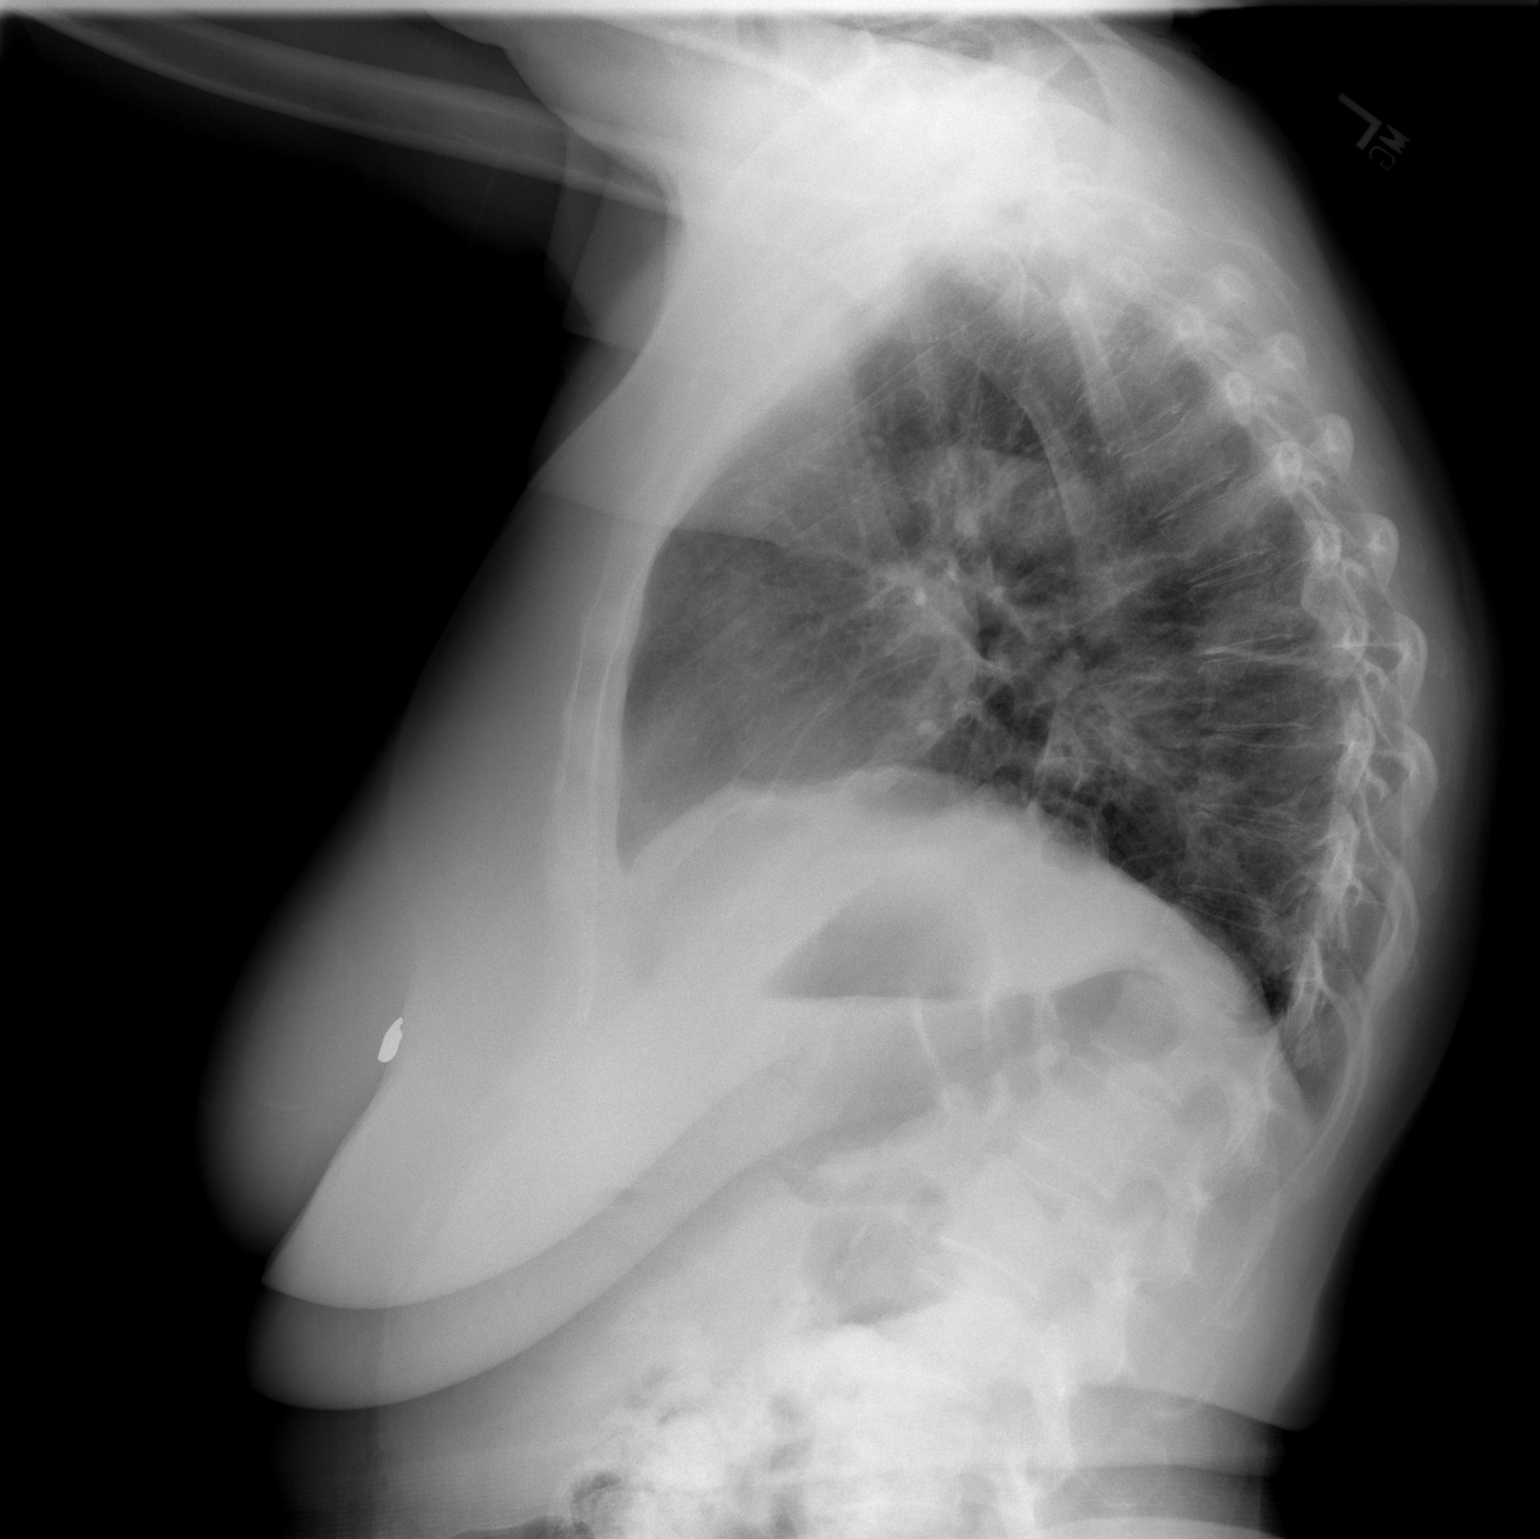

[2 of 2 positions shown; findings below may reference images not displayed]

FINDINGS: Low lung volumes with persistent subsegmental atelectasis versus basilar scarring.  Remote upper right rib fractures.  No pneumothorax or new consolidation.
IMPRESSION: Persistent basilar atelectasis versus scarring and small effusions.  No significant interval change.

## 2008-07-31 ENCOUNTER — Encounter (INDEPENDENT_AMBULATORY_CARE_PROVIDER_SITE_OTHER): Payer: Self-pay | Admitting: Internal Medicine

## 2008-07-31 ENCOUNTER — Ambulatory Visit: Payer: Self-pay | Admitting: Family Medicine

## 2008-07-31 LAB — CONVERTED CEMR LAB
TSH: 1.602 microintl units/mL (ref 0.350–4.500)
Uric Acid, Serum: 2.8 mg/dL — ABNORMAL LOW (ref 4.0–7.8)

## 2008-08-01 ENCOUNTER — Encounter (INDEPENDENT_AMBULATORY_CARE_PROVIDER_SITE_OTHER): Payer: Self-pay | Admitting: Internal Medicine

## 2008-08-01 LAB — CONVERTED CEMR LAB
Basophils Absolute: 0 10*3/uL (ref 0.0–0.1)
Basophils Relative: 0 % (ref 0–1)
Eosinophils Absolute: 0.1 10*3/uL (ref 0.0–0.7)
Eosinophils Relative: 1 % (ref 0–5)
HCT: 35.3 % — ABNORMAL LOW (ref 39.0–52.0)
Hemoglobin: 11.1 g/dL — ABNORMAL LOW (ref 13.0–17.0)
Lymphocytes Relative: 34 % (ref 12–46)
Lymphs Abs: 2.5 10*3/uL (ref 0.7–4.0)
MCHC: 31.4 g/dL (ref 30.0–36.0)
MCV: 75.3 fL — ABNORMAL LOW (ref 78.0–100.0)
Monocytes Absolute: 0.6 10*3/uL (ref 0.1–1.0)
Monocytes Relative: 8 % (ref 3–12)
Neutro Abs: 4.3 10*3/uL (ref 1.7–7.7)
Neutrophils Relative %: 57 % (ref 43–77)
Platelets: 337 10*3/uL (ref 150–400)
RBC: 4.69 M/uL (ref 4.22–5.81)
RDW: 14.5 % (ref 11.5–15.5)
WBC: 7.4 10*3/uL (ref 4.0–10.5)

## 2008-08-02 ENCOUNTER — Telehealth: Payer: Self-pay | Admitting: Internal Medicine

## 2008-08-05 IMAGING — CR DG CHEST 2V
2 series · 2 of 2 positions shown · non-contrast
Comparison: 03/03/07.

CLINICAL DATA: Right effusion.   Hiatal hernia repair.  Short of breath.  
 CHEST - 2 VIEW:

[w chest pa]
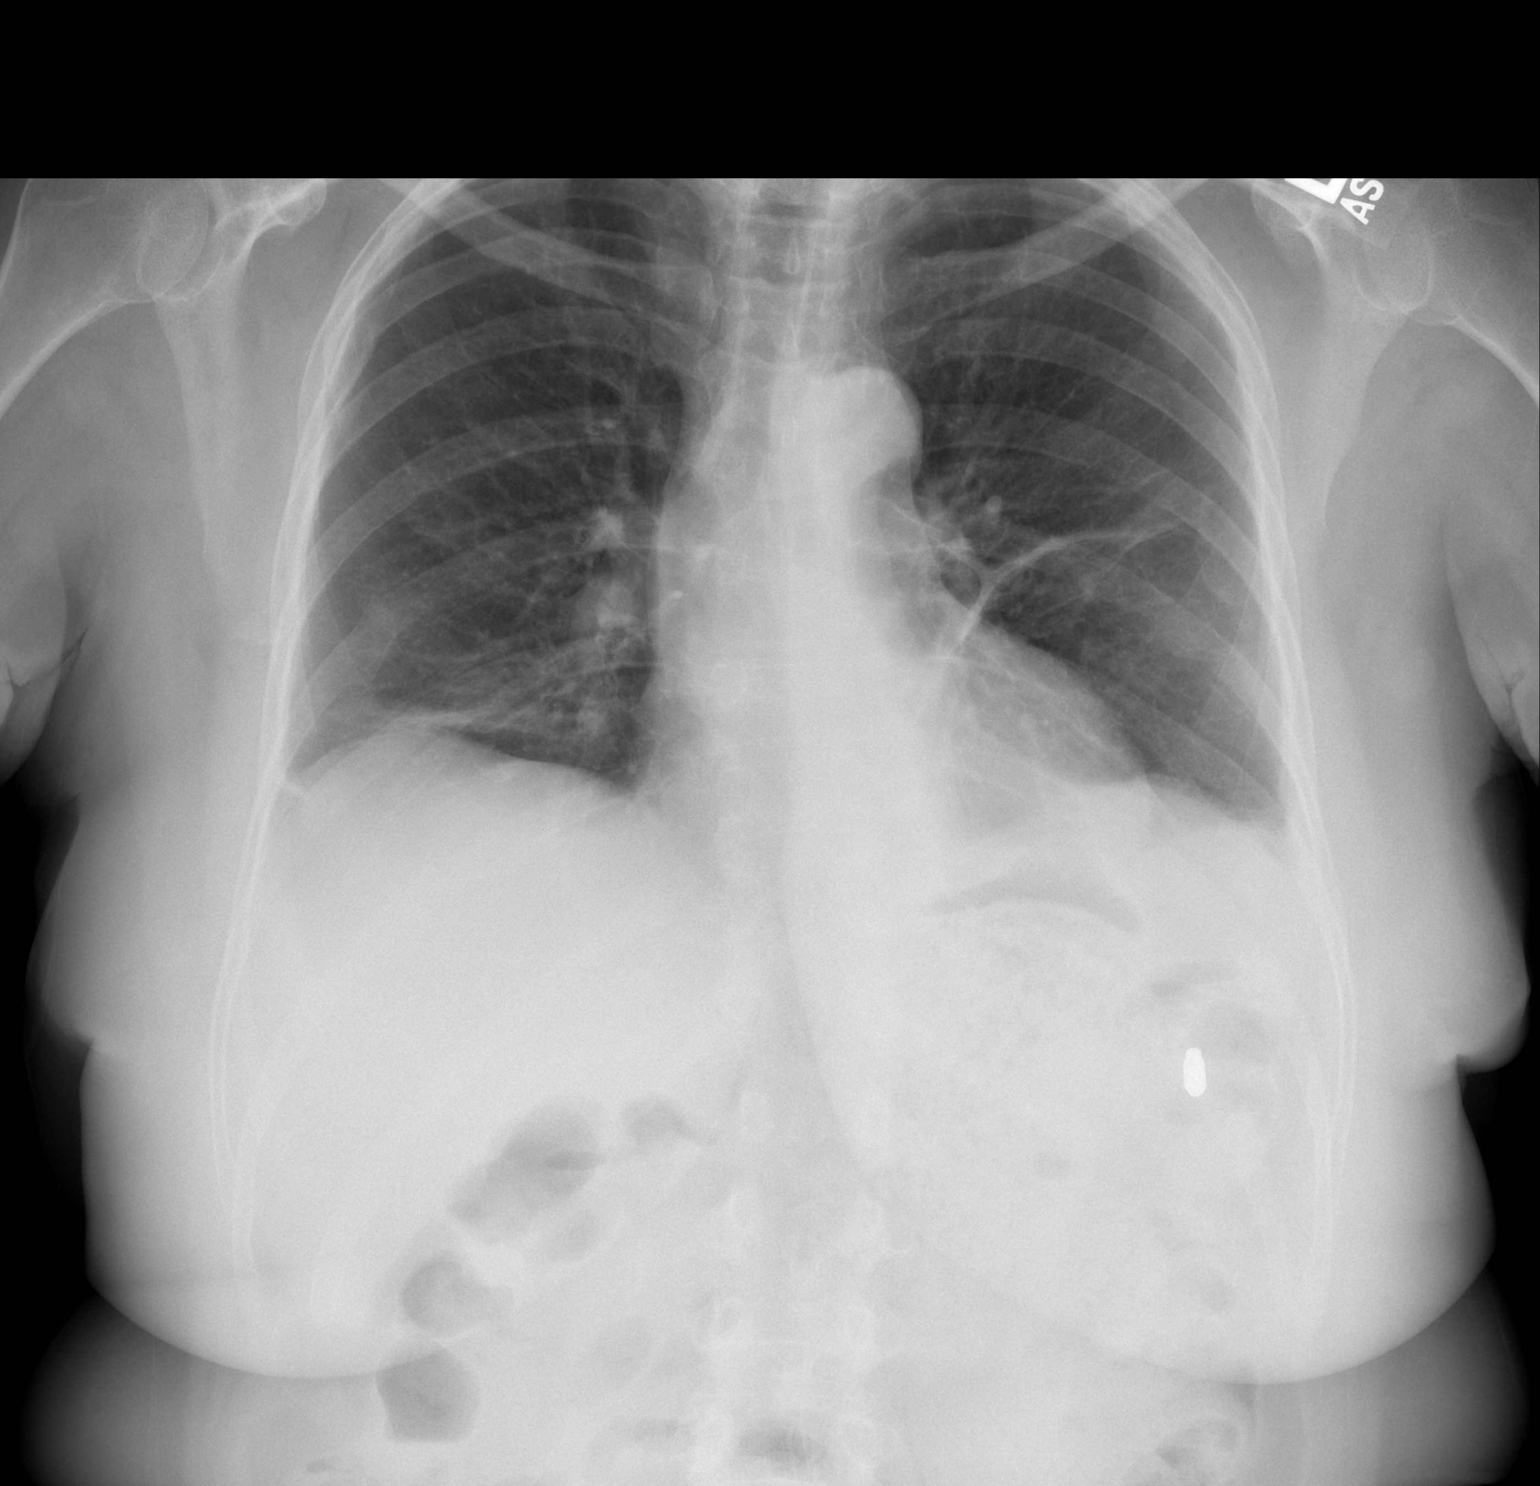

[w chest lat]
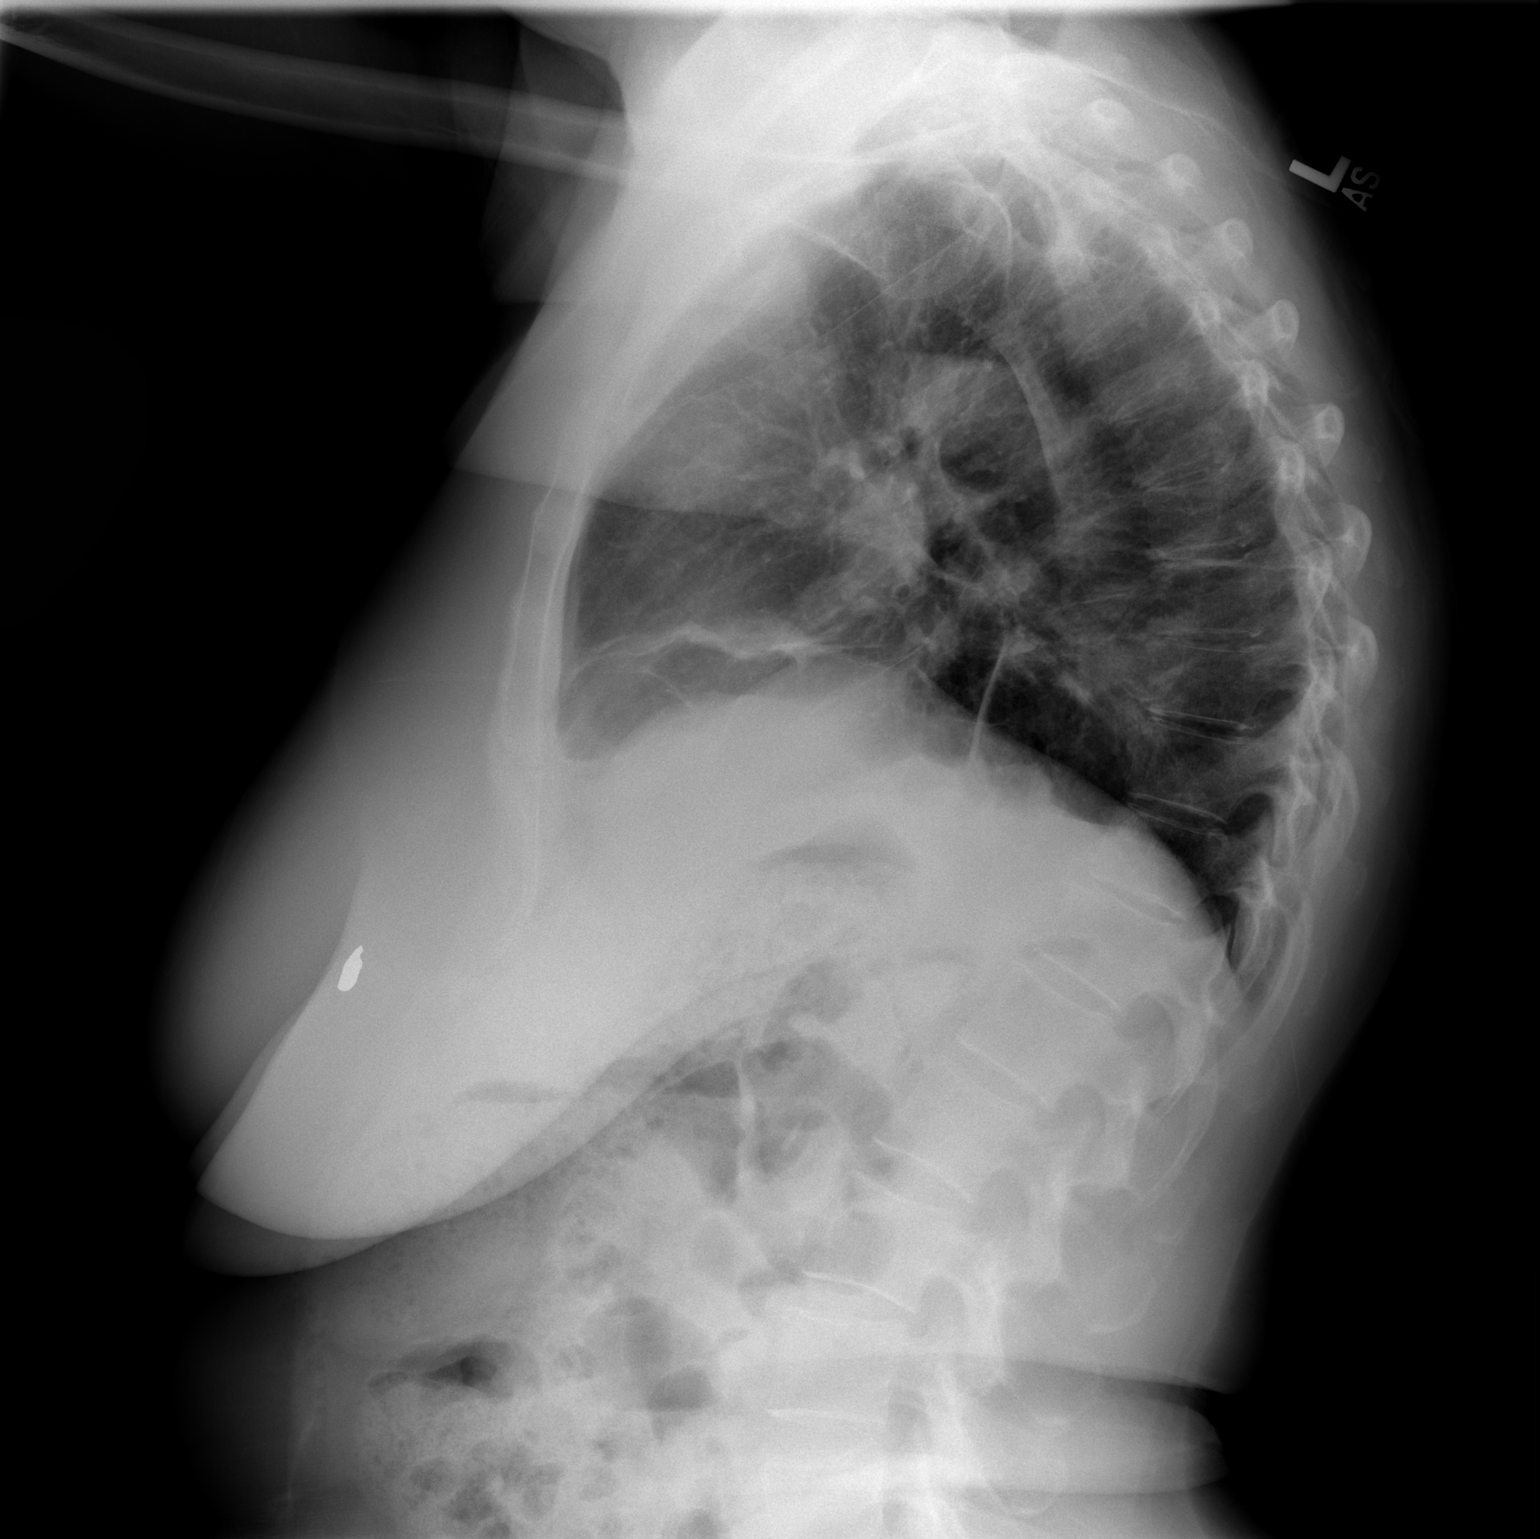

[2 of 2 positions shown; findings below may reference images not displayed]

FINDINGS: Two views of the chest show linear scarring at the left midlung and at the lung bases.  No active infiltrate or effusion is seen.  The heart is within normal limits in size.  Metallic foreign body remains lodged within the left breast.
IMPRESSION: Stable linear scarring.  No active lung disease.

## 2008-08-07 ENCOUNTER — Ambulatory Visit: Payer: Self-pay | Admitting: Internal Medicine

## 2008-08-08 LAB — CONVERTED CEMR LAB
Basophils Absolute: 0 10*3/uL (ref 0.0–0.1)
Basophils Relative: 0.2 % (ref 0.0–3.0)
Eosinophils Absolute: 0 10*3/uL (ref 0.0–0.7)
Eosinophils Relative: 0.6 % (ref 0.0–5.0)
HCT: 33.6 % — ABNORMAL LOW (ref 36.0–46.0)
Hemoglobin: 10.7 g/dL — ABNORMAL LOW (ref 12.0–15.0)
Iron: 64 ug/dL (ref 42–145)
Lymphocytes Relative: 32.3 % (ref 12.0–46.0)
Lymphs Abs: 2.1 10*3/uL (ref 0.7–4.0)
MCHC: 31.8 g/dL (ref 30.0–36.0)
MCV: 77 fL — ABNORMAL LOW (ref 78.0–100.0)
Monocytes Absolute: 0.5 10*3/uL (ref 0.1–1.0)
Monocytes Relative: 7.8 % (ref 3.0–12.0)
Neutro Abs: 4 10*3/uL (ref 1.4–7.7)
Neutrophils Relative %: 59.1 % (ref 43.0–77.0)
Platelets: 252 10*3/uL (ref 150.0–400.0)
RBC: 4.36 M/uL (ref 3.87–5.11)
RDW: 13.6 % (ref 11.5–14.6)
Saturation Ratios: 23.1 % (ref 20.0–50.0)
Transferrin: 197.5 mg/dL — ABNORMAL LOW (ref 212.0–360.0)
WBC: 6.6 10*3/uL (ref 4.5–10.5)

## 2008-08-09 ENCOUNTER — Ambulatory Visit: Payer: Self-pay | Admitting: Obstetrics and Gynecology

## 2008-08-09 ENCOUNTER — Encounter: Payer: Self-pay | Admitting: Obstetrics and Gynecology

## 2008-08-16 ENCOUNTER — Encounter: Payer: Self-pay | Admitting: Internal Medicine

## 2008-08-17 ENCOUNTER — Telehealth: Payer: Self-pay | Admitting: Internal Medicine

## 2008-08-20 ENCOUNTER — Encounter (HOSPITAL_COMMUNITY): Admission: RE | Admit: 2008-08-20 | Discharge: 2008-09-06 | Payer: Self-pay | Admitting: Internal Medicine

## 2008-08-21 ENCOUNTER — Telehealth: Payer: Self-pay | Admitting: Internal Medicine

## 2008-08-31 ENCOUNTER — Ambulatory Visit: Payer: Self-pay | Admitting: Internal Medicine

## 2008-09-03 ENCOUNTER — Ambulatory Visit: Payer: Self-pay | Admitting: Internal Medicine

## 2008-09-04 ENCOUNTER — Encounter: Payer: Self-pay | Admitting: Internal Medicine

## 2008-09-04 LAB — CONVERTED CEMR LAB
Basophils Absolute: 0 10*3/uL (ref 0.0–0.1)
Basophils Relative: 0.4 % (ref 0.0–3.0)
Eosinophils Absolute: 0.1 10*3/uL (ref 0.0–0.7)
Eosinophils Relative: 1.1 % (ref 0.0–5.0)
HCT: 34.8 % — ABNORMAL LOW (ref 36.0–46.0)
Hemoglobin: 11.3 g/dL — ABNORMAL LOW (ref 12.0–15.0)
Iron: 100 ug/dL (ref 42–145)
Lymphocytes Relative: 41.9 % (ref 12.0–46.0)
Lymphs Abs: 2.6 10*3/uL (ref 0.7–4.0)
MCHC: 32.6 g/dL (ref 30.0–36.0)
MCV: 76 fL — ABNORMAL LOW (ref 78.0–100.0)
Monocytes Absolute: 0.5 10*3/uL (ref 0.1–1.0)
Monocytes Relative: 7.7 % (ref 3.0–12.0)
Neutro Abs: 2.9 10*3/uL (ref 1.4–7.7)
Neutrophils Relative %: 48.9 % (ref 43.0–77.0)
Platelets: 236 10*3/uL (ref 150.0–400.0)
RBC: 4.58 M/uL (ref 3.87–5.11)
RDW: 13.1 % (ref 11.5–14.6)
Saturation Ratios: 40.2 % (ref 20.0–50.0)
Transferrin: 177.6 mg/dL — ABNORMAL LOW (ref 212.0–360.0)
WBC: 6.1 10*3/uL (ref 4.5–10.5)

## 2008-09-06 ENCOUNTER — Ambulatory Visit: Payer: Self-pay | Admitting: Internal Medicine

## 2008-09-10 ENCOUNTER — Ambulatory Visit: Payer: Self-pay | Admitting: Internal Medicine

## 2008-09-14 ENCOUNTER — Ambulatory Visit (HOSPITAL_COMMUNITY): Admission: RE | Admit: 2008-09-14 | Discharge: 2008-09-14 | Payer: Self-pay | Admitting: Orthopedic Surgery

## 2008-09-24 ENCOUNTER — Encounter: Payer: Self-pay | Admitting: Internal Medicine

## 2008-09-27 ENCOUNTER — Ambulatory Visit (HOSPITAL_BASED_OUTPATIENT_CLINIC_OR_DEPARTMENT_OTHER): Admission: RE | Admit: 2008-09-27 | Discharge: 2008-09-27 | Payer: Self-pay | Admitting: Orthopedic Surgery

## 2008-09-28 IMAGING — US US ABDOMEN COMPLETE
1 series · 14 of 25 positions shown · non-contrast
Comparison: none

CLINICAL DATA: 66-year-old with pain.
ABDOMEN ULTRASOUND:
TECHNIQUE: Complete abdominal ultrasound examination was performed including evaluation of the liver, gallbladder, bile ducts, pancreas, kidneys, spleen, IVC, and abdominal aorta.

[Series 1: unknown · 0.26mm/px · 14 of 59 slices shown]
[im 1/59]
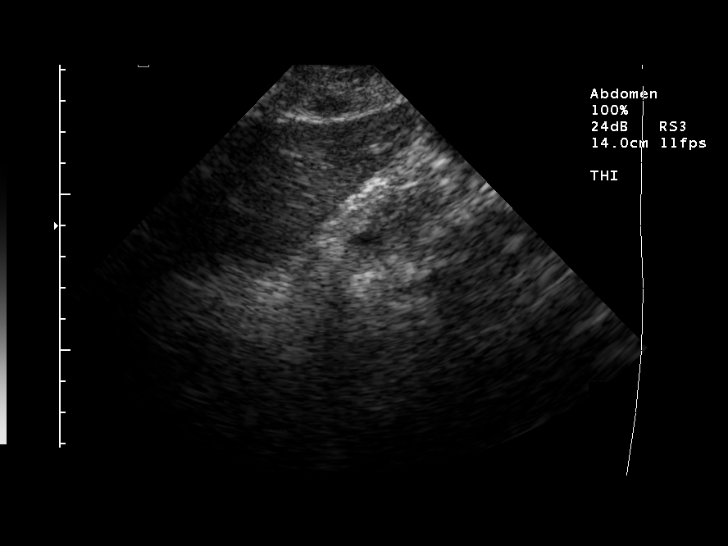
[im 5/59]
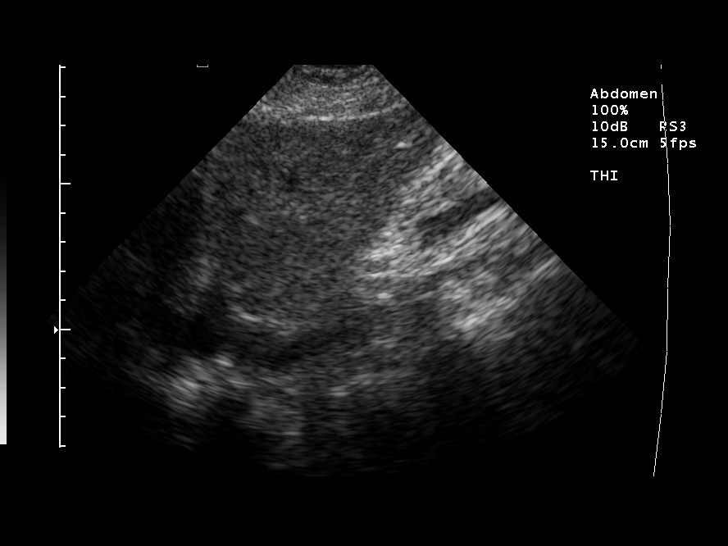
[im 10/59]
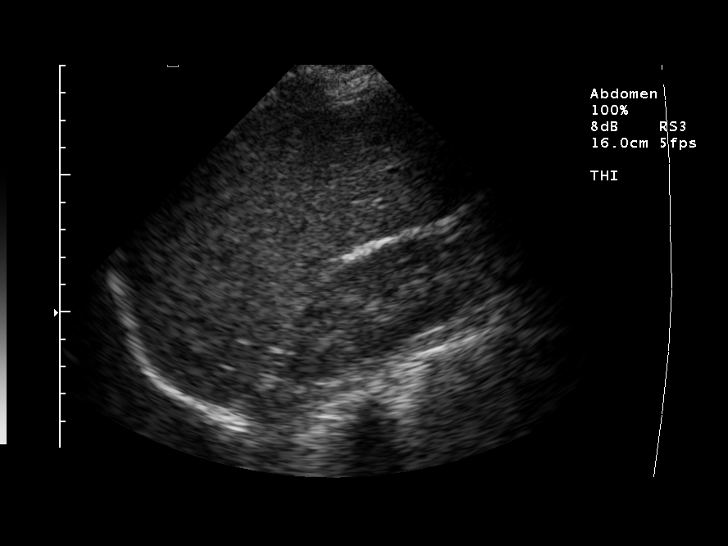
[im 15/59]
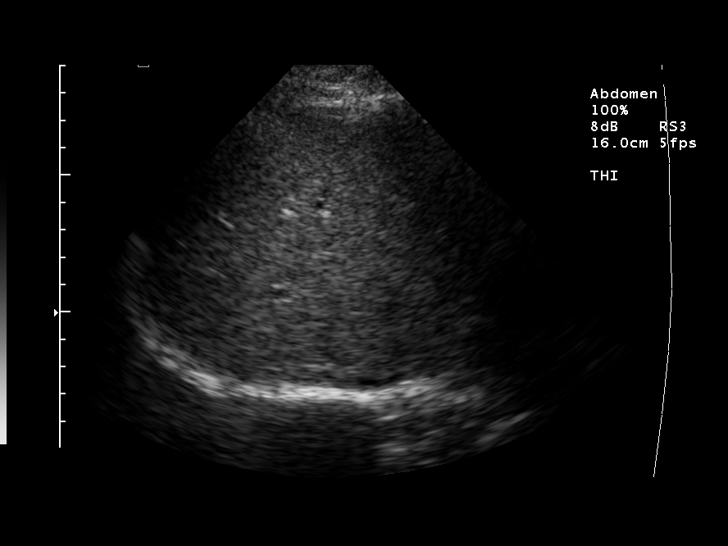
[im 20/59]
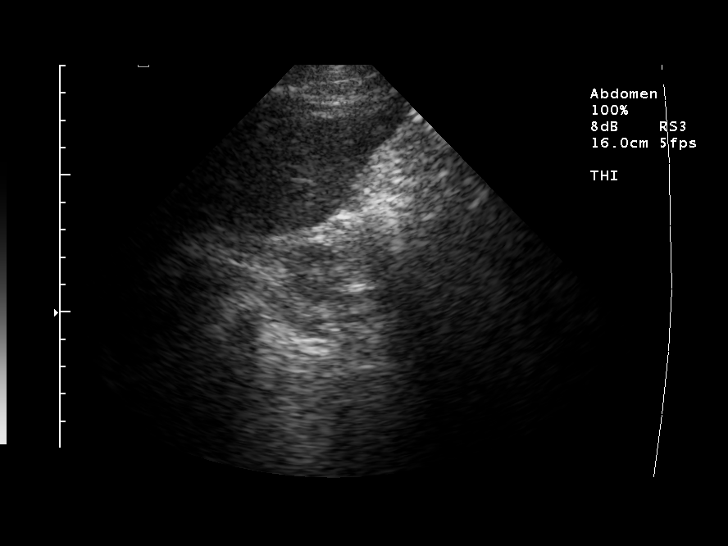
[im 22/59]
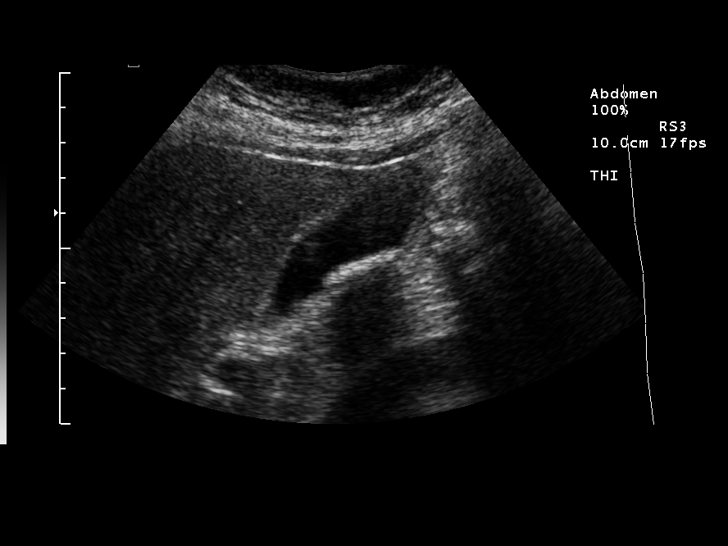
[im 27/59]
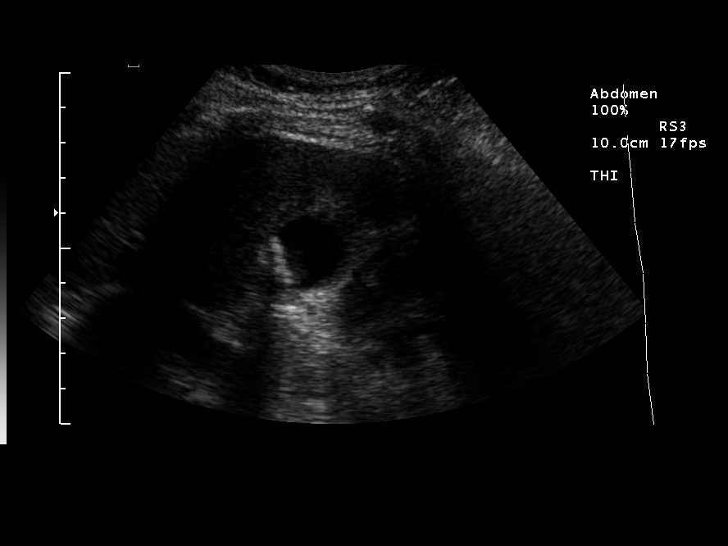
[im 32/59]
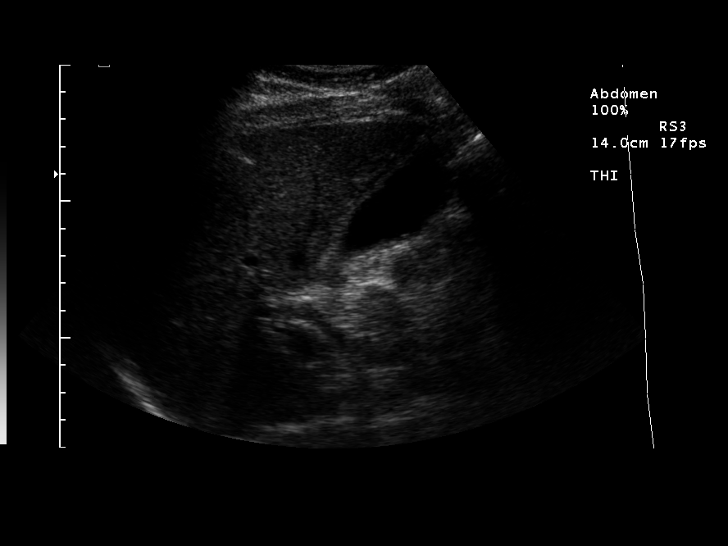
[im 37/59]
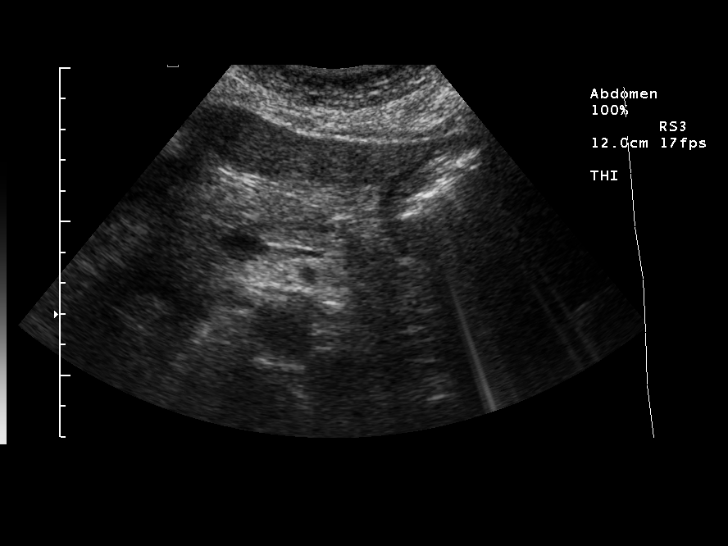
[im 39/59]
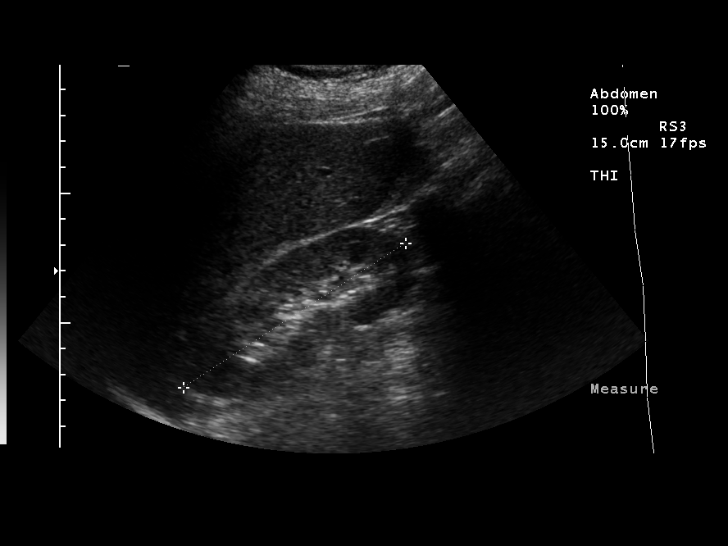
[im 44/59]
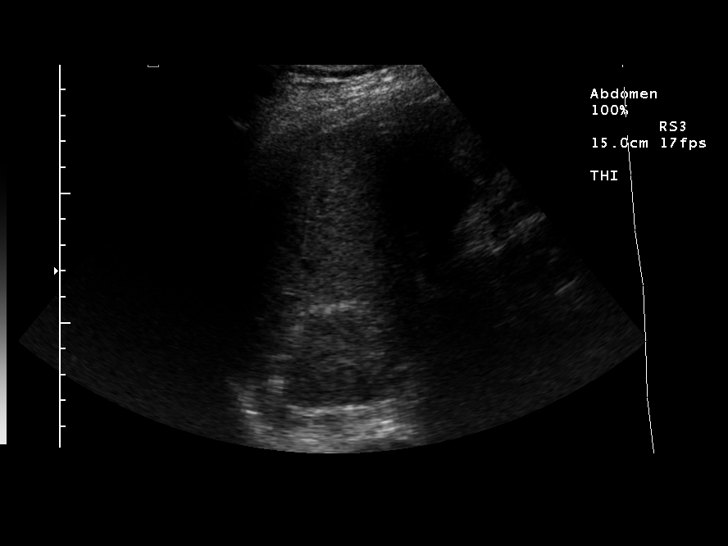
[im 49/59]
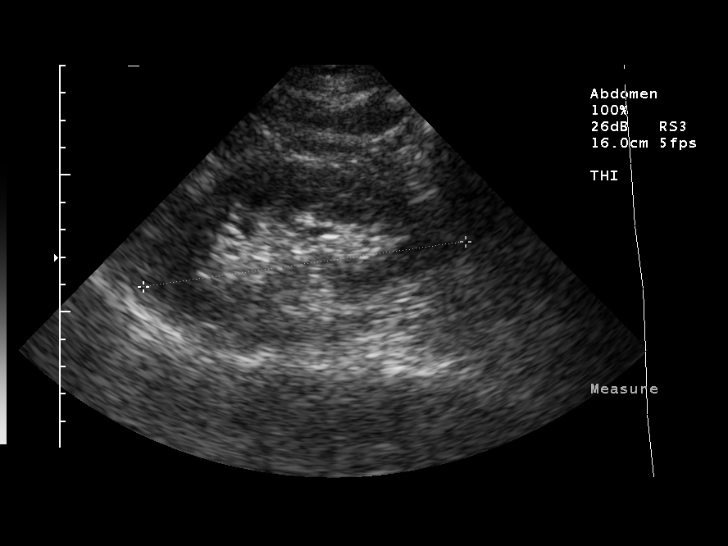
[im 54/59]
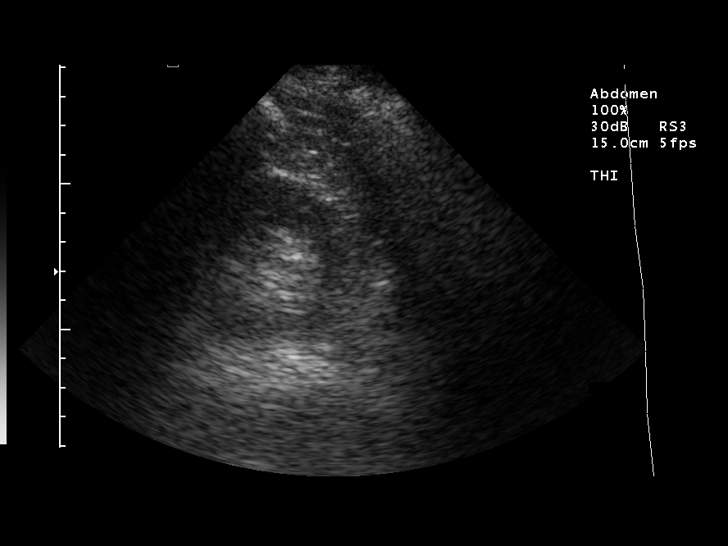
[im 59/59]
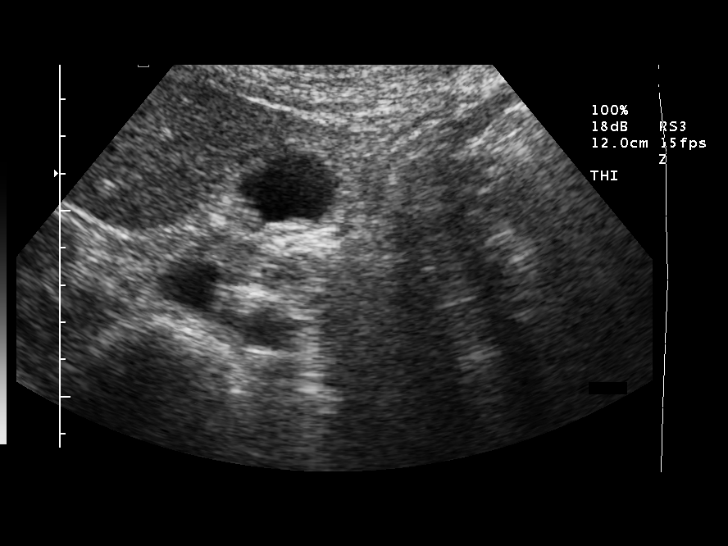

[14 of 25 positions shown; findings below may reference images not displayed]

FINDINGS: The gallbladder wall is thick measuring between 3-4 mm.  There is also echogenic material layering in the gallbladder suggestive for stones.  Limited evaluation of the left kidney, but there is no evidence for hydronephrosis.  Left kidney roughly measures 11.9 cm.  Normal appearance of the spleen.  The right kidney has a normal morphology measuring 10.2 cm without hydronephrosis.  The common bile duct measures up to 8 mm.  The liver parenchyma is slightly heterogeneous without focal abnormality.
IMPRESSION: Cholelithiasis and mild gallbladder wall thickening.  Acute cholecystitis cannot be excluded based on these findings.  No evidence for intrahepatic biliary dilatation and no significant pancreatic duct dilatation.

## 2008-09-29 IMAGING — RF DG CHOLANGIOGRAM OPERATIVE
1 series · 17 of 24 positions shown · non-contrast
Comparison: none

CLINICAL DATA: Cholelithiasis

[Series 0: run · 17 of 34 slices shown]
[im 1/34]
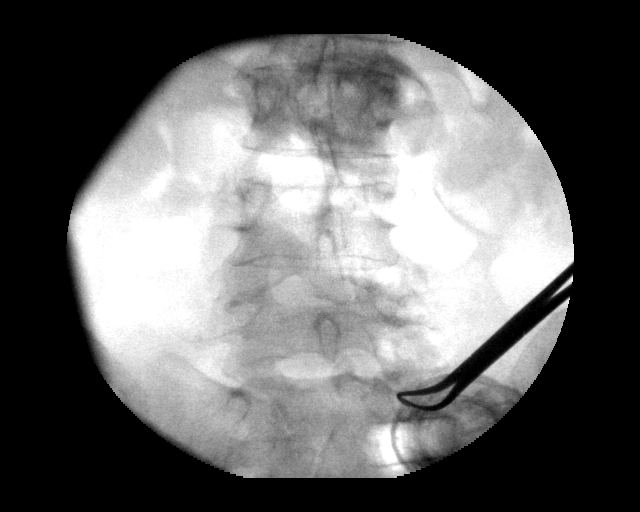
[im 3/34]
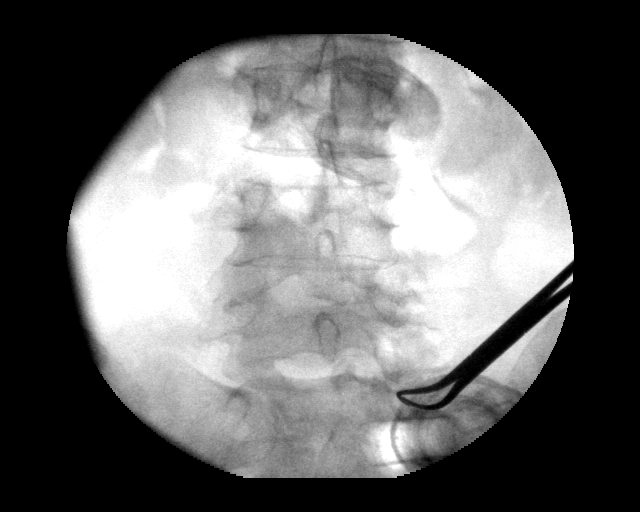
[im 5/34]
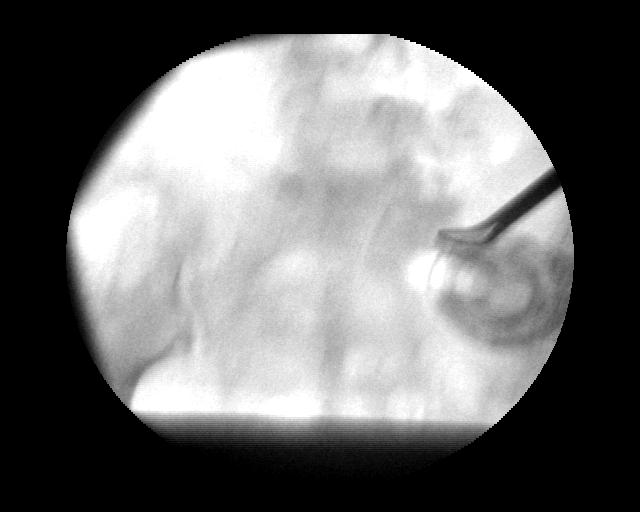
[im 6/34]
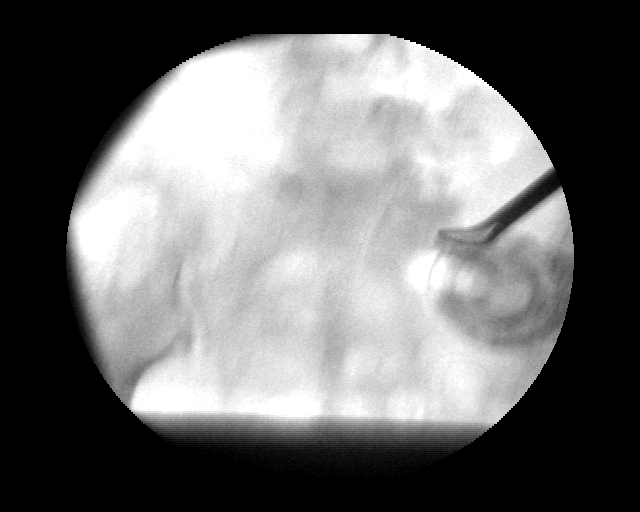
[im 9/34]
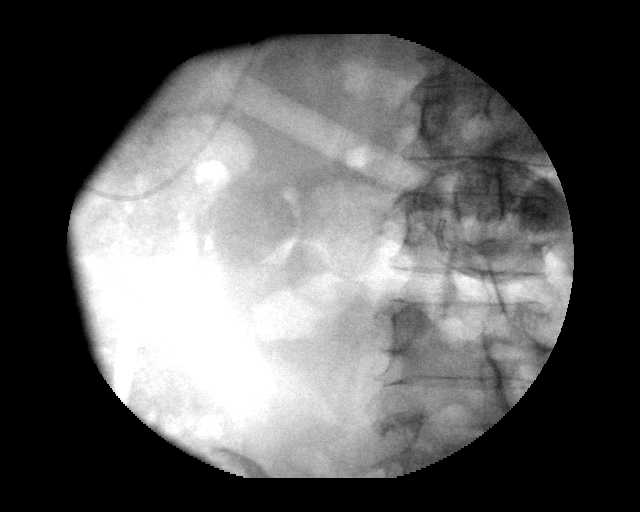
[im 11/34]
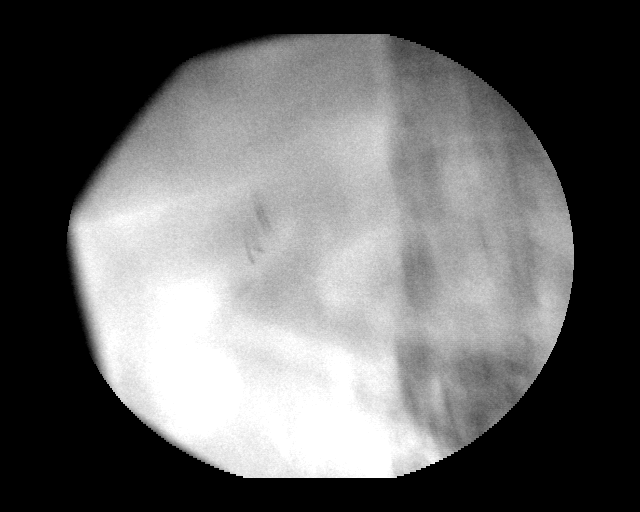
[im 13/34]
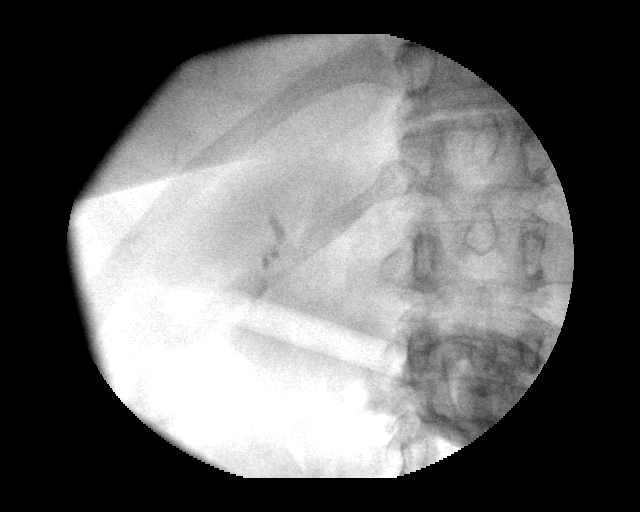
[im 15/34]
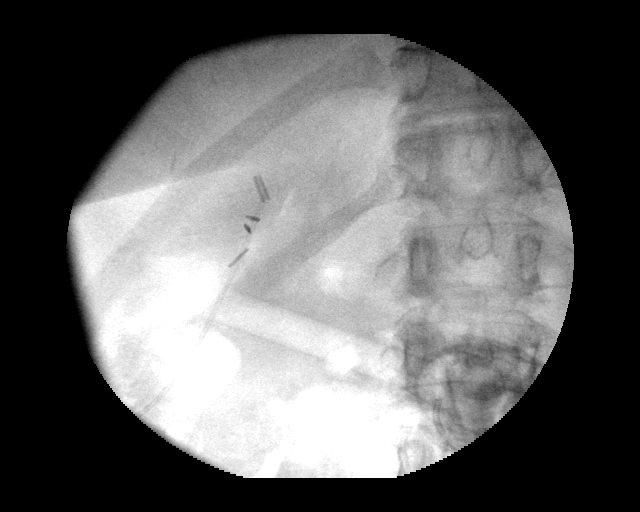
[im 18/34]
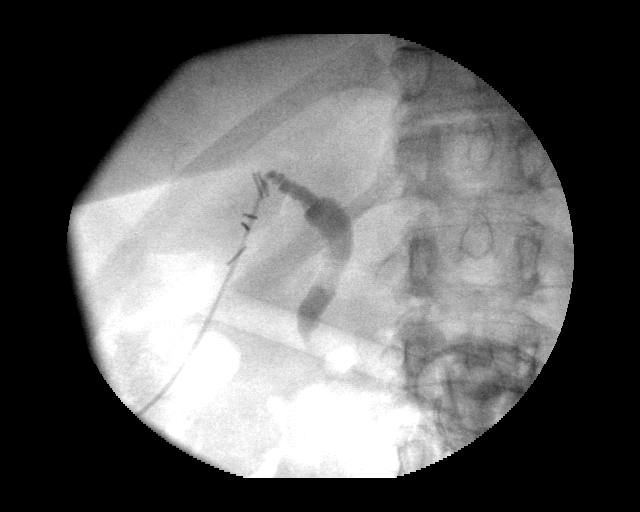
[im 19/34]
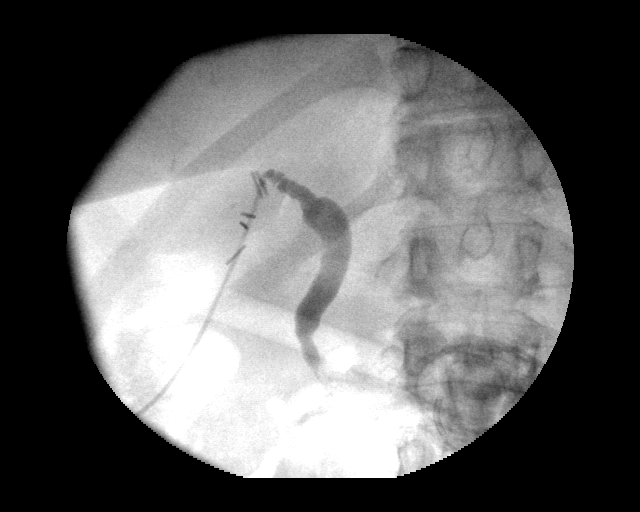
[im 21/34]
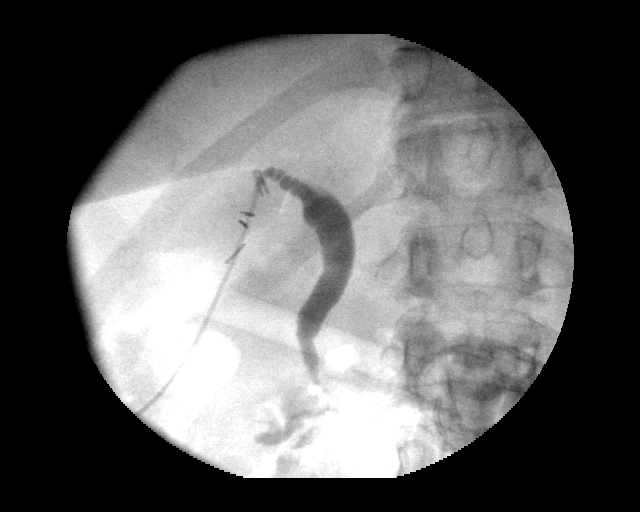
[im 23/34]
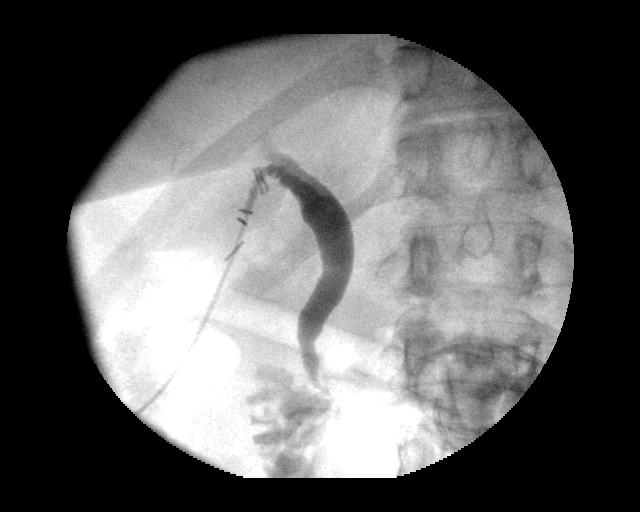
[im 25/34]
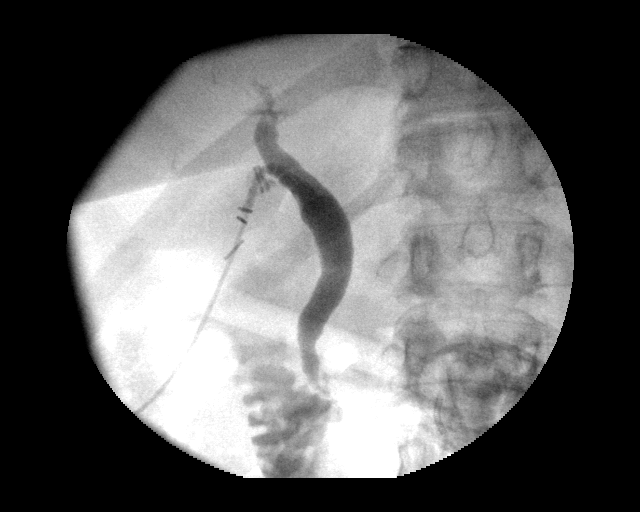
[im 28/34]
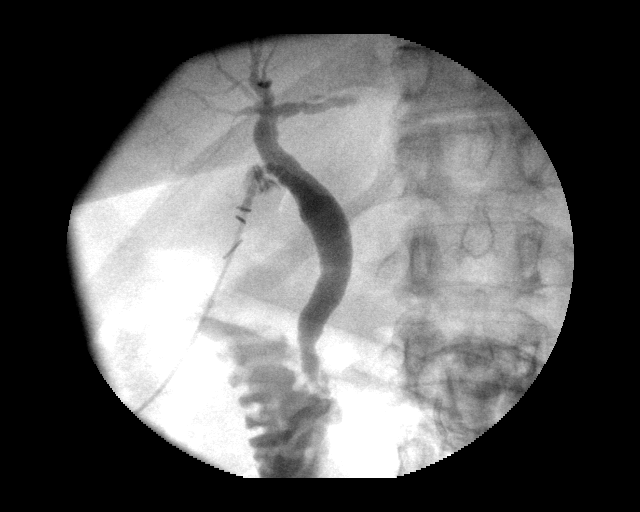
[im 29/34]
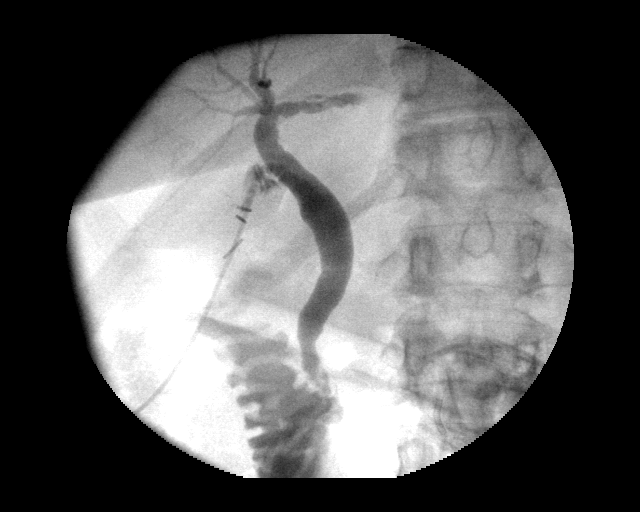
[im 31/34]
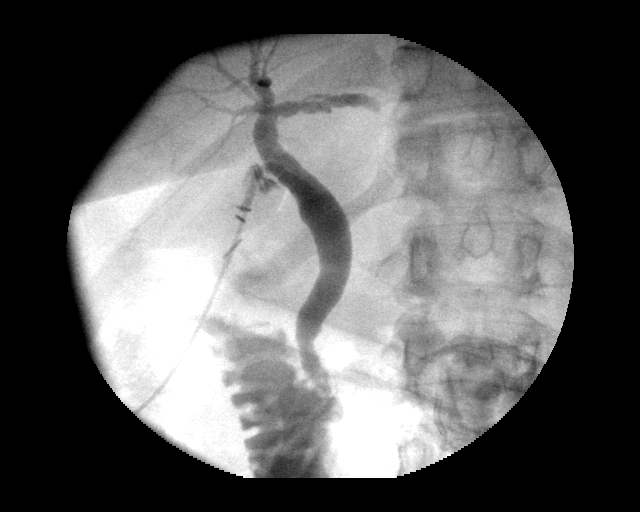
[im 34/34]
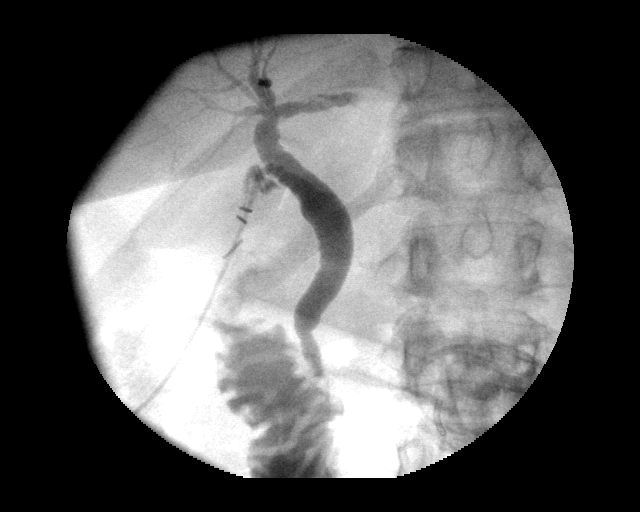

[17 of 24 positions shown; findings below may reference images not displayed]

INTRAOPERATIVE CHOLANGIOGRAM:

34  images from intraoperative C-arm fluoroscopy demonstrate  opacification of
the common bile duct. No filling defects to suggest retained stones. There is
incomplete evaluation of intrahepatic biliary tree, which appears decompressed
centrally. Contrast appears to flow on into decompressed duodenum.
IMPRESSION: 1. Negative for retained common duct stone

## 2008-10-04 ENCOUNTER — Encounter: Payer: Self-pay | Admitting: Internal Medicine

## 2008-10-09 ENCOUNTER — Ambulatory Visit: Payer: Self-pay | Admitting: Internal Medicine

## 2008-10-16 ENCOUNTER — Telehealth: Payer: Self-pay | Admitting: Internal Medicine

## 2008-10-18 ENCOUNTER — Encounter: Admission: RE | Admit: 2008-10-18 | Discharge: 2008-12-12 | Payer: Self-pay | Admitting: Orthopedic Surgery

## 2008-10-23 IMAGING — RF DG UGI W/ KUB
16 of 24 series · 16 of 24 positions shown · non-contrast
Comparison: none

CLINICAL DATA: 86 year old female with full feeling, bloating.  Prior gallbladder surgery 3 weeks ago and prior to that a hiatal hernia repair in February 2007.
UPPER GI WITH KUB:
TECHNIQUE: Under fluoroscopic observation patient was administered effervescent fluid and barium contrast.  Examination of the esophagus, stomach, and duodenum were obtained in multiple projections.

[Series 1: run · 1 of 1 slices shown (1 of 16)]
[im 1/1]
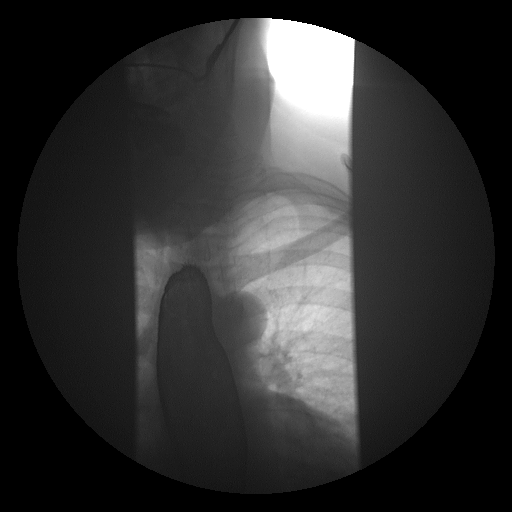

[Series 3: run · 1 of 1 slices shown (2 of 16)]
[im 1/1]
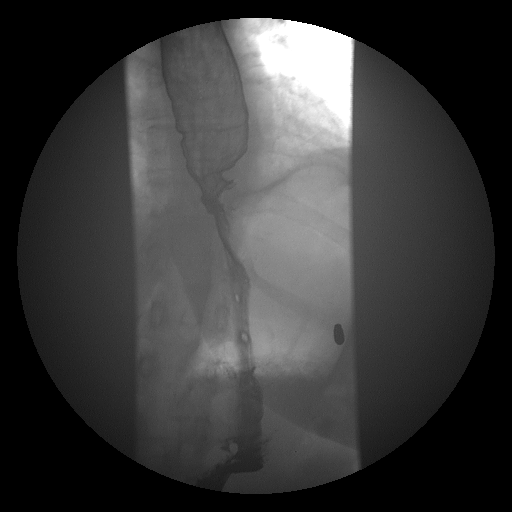

[Series 4: run · 1 of 1 slices shown (3 of 16)]
[im 1/1]
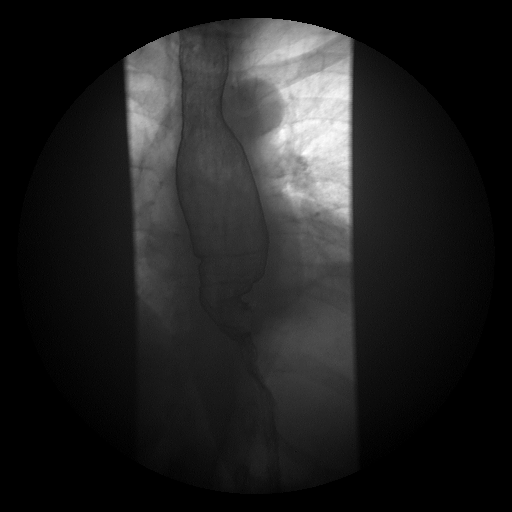

[Series 6: run · 1 of 1 slices shown (4 of 16)]
[im 1/1]
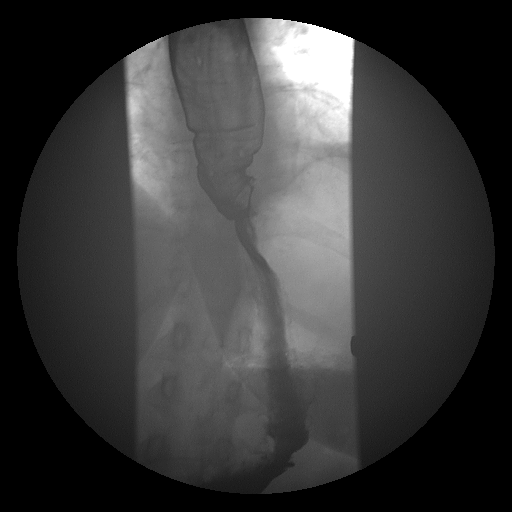

[Series 7: run · 1 of 1 slices shown (5 of 16)]
[im 1/1]
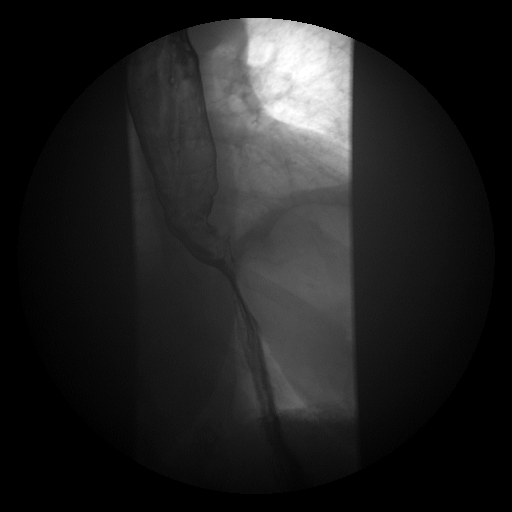

[Series 9: run · 1 of 1 slices shown (6 of 16)]
[im 1/1]
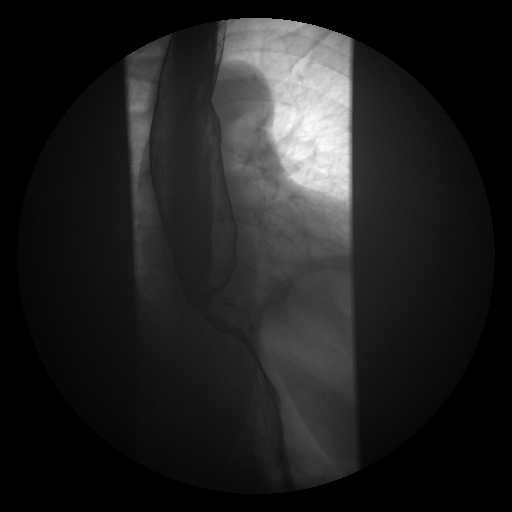

[Series 10: run · 1 of 1 slices shown (7 of 16)]
[im 1/1]
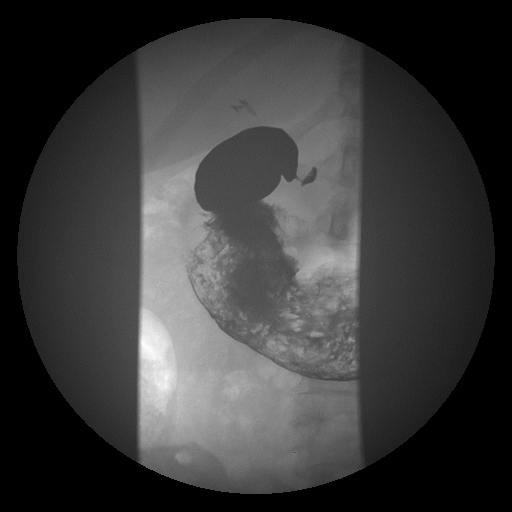

[Series 12: run · 1 of 1 slices shown (8 of 16)]
[im 1/1]
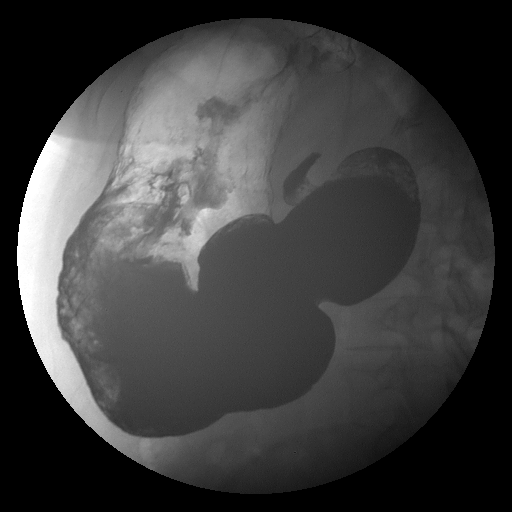

[Series 13: run · 1 of 1 slices shown (9 of 16)]
[im 1/1]
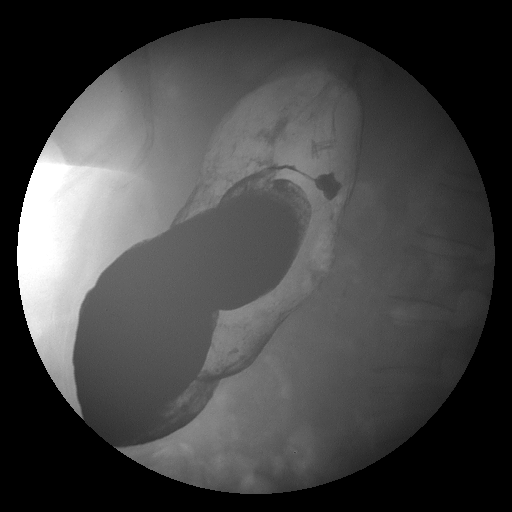

[Series 15: run · 1 of 1 slices shown (10 of 16)]
[im 1/1]
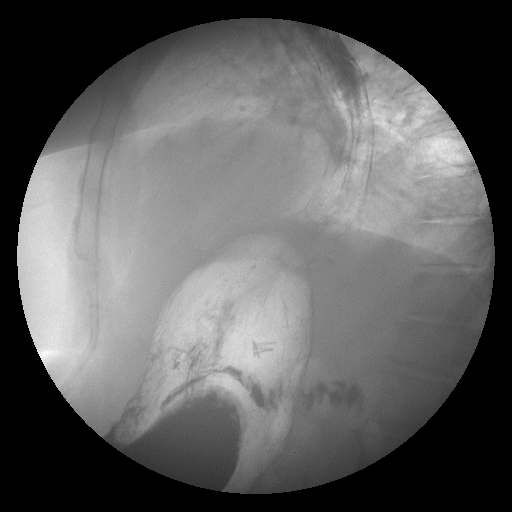

[Series 16: run · 1 of 1 slices shown (11 of 16)]
[im 1/1]
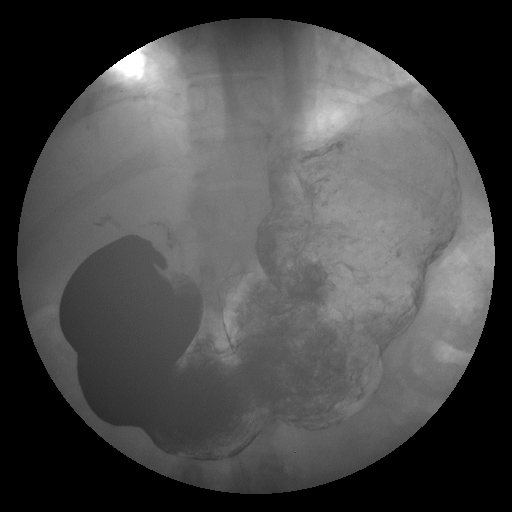

[Series 18: run · 1 of 1 slices shown (12 of 16)]
[im 1/1]
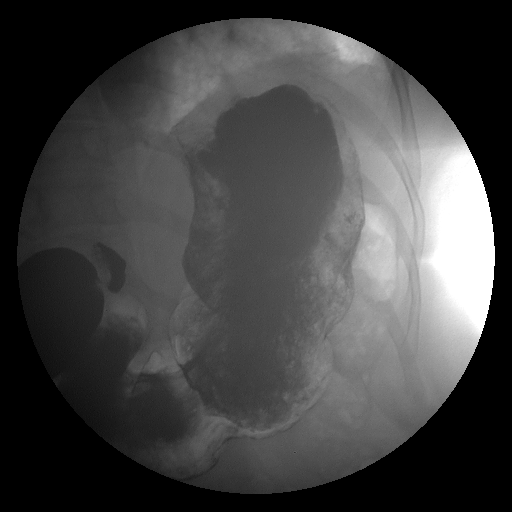

[Series 19: run · 1 of 1 slices shown (13 of 16)]
[im 1/1]
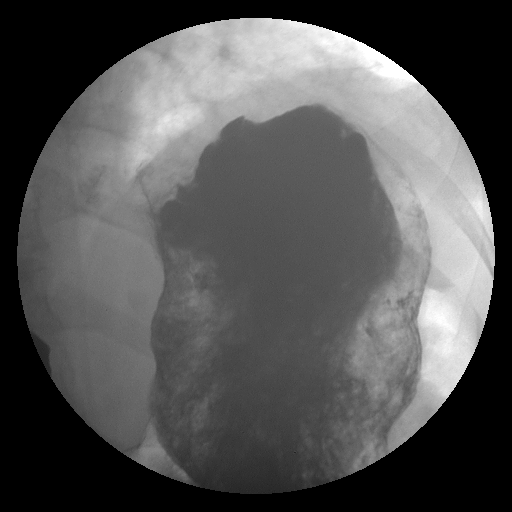

[Series 21: run · 1 of 1 slices shown (14 of 16)]
[im 1/1]
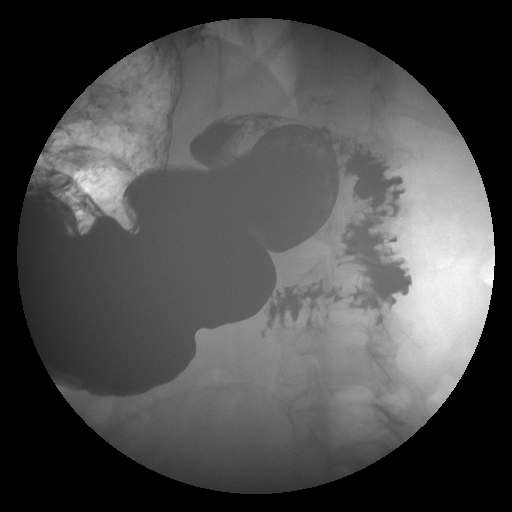

[Series 22: run · 1 of 1 slices shown (15 of 16)]
[im 1/1]
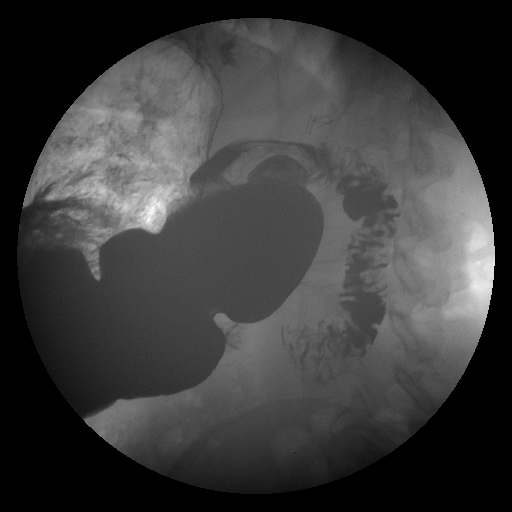

[Series 24: run · 1 of 1 slices shown (16 of 16)]
[im 1/1]
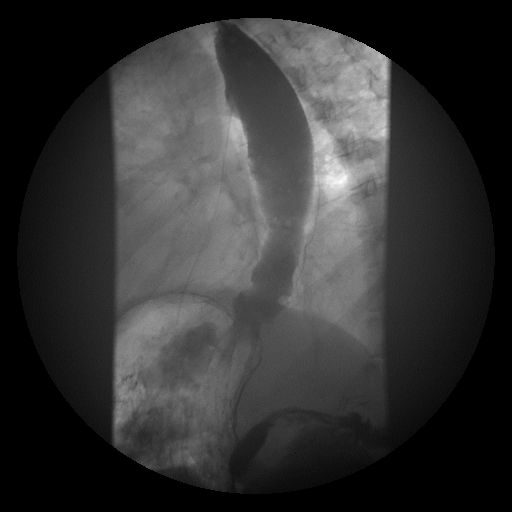

[16 of 24 positions shown; findings below may reference images not displayed]

FINDINGS: Esophagus:  The esophagus is patulous without evidence of focal stricture or mass.  There is narrowing at the GE junction likely related to prior hernia repair.  A 13 mm barium tablet passes the GE junction easily.   There is mild to moderate esophageal dysmotility with mild tertiary contractions.  There is a moderate amount of reflux demonstrated to the mid to upper esophagus.
IMPRESSION: 1.  Patulous esophagus without clear obstruction.
2.  Mild esophageal dysmotility.  
3.  Moderate gastroesophageal reflux to the mid to upper thoracic esophagus. No evidence of hiatal hernia.
Stomach:  There is significant retained foodstuff within the stomach.  This makes mucosal examination insensitive.  No evidence of gross mass or stricture.  Contrast flows through the duodenal loop with a normal sweep of the duodenal C loop.  No evidence of gross duodenal obstruction or mass.
IMPRESSION: 1.  Retained foodstuff within stomach may represent gastroparesis.
2.  No focal mass or stricture within the stomach or duodenum.  
3.  Fine mucosal detail of the stomach is limited due to significant retained foodstuff in stomach.

## 2008-10-26 ENCOUNTER — Ambulatory Visit: Payer: Self-pay | Admitting: Internal Medicine

## 2008-10-29 IMAGING — MG MM DIAGNOSTIC UNILATERAL R
5 series · 5 of 5 positions shown · non-contrast
Comparison: none

DG DIAGNOSTIC UNILATERAL R
CC and MLO view(s) were taken of the right breast.

DIGITAL UNILATERAL RIGHT DIAGNOSTIC MAMMOGRAM WITH CAD:
CLINICAL DATA: Six-month follow-up right breast calcifications.
Comparison studies are dated 12-03-06 and 12-15-06.  Calcifications within the upper central right 
breast are unchanged.  There are no suspicious features today.

[R CC (1 of 3)]
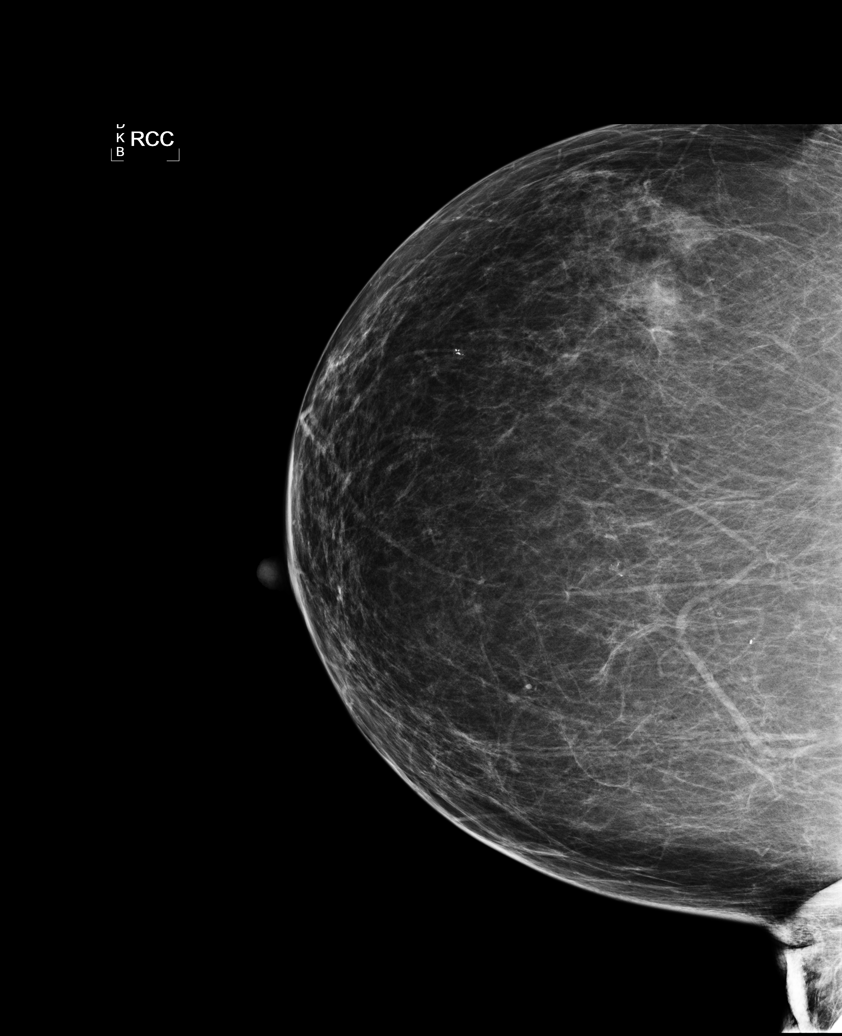

[R MLO]
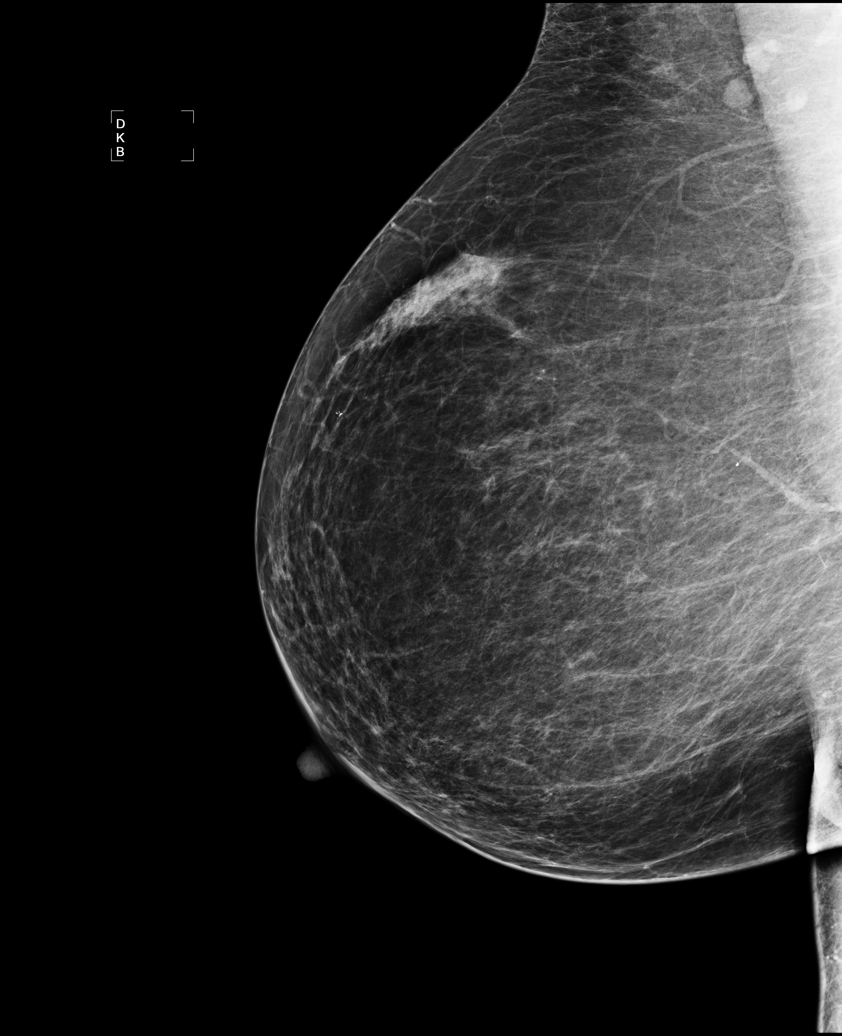

[R CC (2 of 3)]
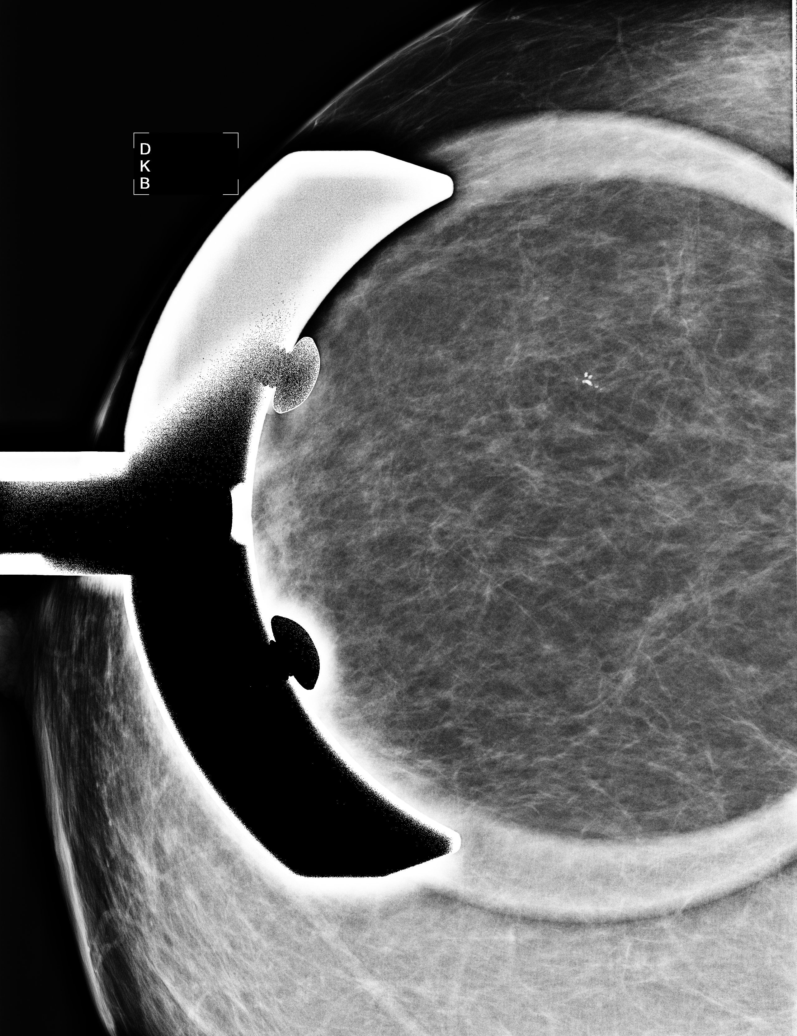

[R ML]
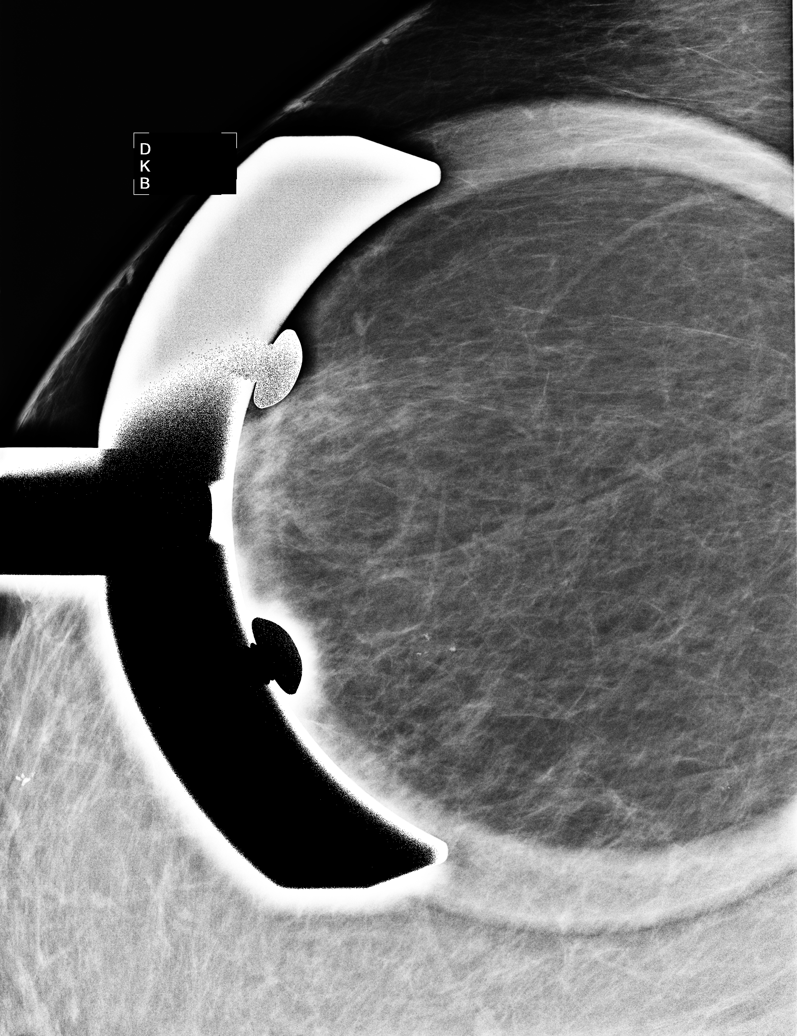

[R CC (3 of 3)]
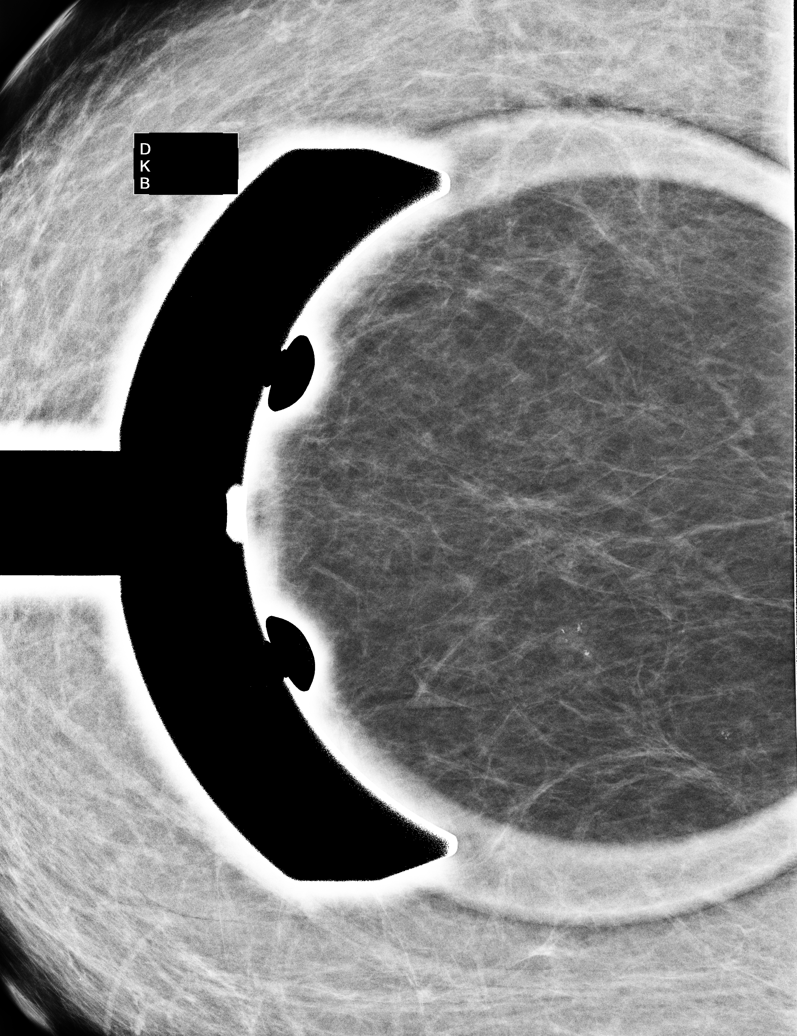

[5 of 5 positions shown; findings below may reference images not displayed]

IMPRESSION: Stable benign-appearing calcifications in the upper right breast.  Bilateral diagnostic mammogram 
in December 2007 is recommended to document stability.

ASSESSMENT: Probably benign - BI-RADS 3

ANALYZED BY COMPUTER AIDED DETECTION. , THIS PROCEDURE WAS A DIGITAL MAMMOGRAM.

## 2008-10-30 ENCOUNTER — Encounter: Payer: Self-pay | Admitting: Internal Medicine

## 2008-10-31 ENCOUNTER — Ambulatory Visit: Payer: Self-pay | Admitting: Thoracic Surgery

## 2008-10-31 ENCOUNTER — Telehealth: Payer: Self-pay | Admitting: Internal Medicine

## 2008-10-31 ENCOUNTER — Encounter: Admission: RE | Admit: 2008-10-31 | Discharge: 2008-10-31 | Payer: Self-pay | Admitting: Thoracic Surgery

## 2008-11-01 LAB — CONVERTED CEMR LAB
Albumin: 3.9 g/dL (ref 3.5–5.2)
BUN: 10 mg/dL (ref 6–23)
Basophils Absolute: 0 10*3/uL (ref 0.0–0.1)
Basophils Relative: 0.7 % (ref 0.0–3.0)
CO2: 30 meq/L (ref 19–32)
Calcium: 9.3 mg/dL (ref 8.4–10.5)
Chloride: 104 meq/L (ref 96–112)
Creatinine, Ser: 0.5 mg/dL (ref 0.4–1.2)
Eosinophils Absolute: 0.1 10*3/uL (ref 0.0–0.7)
Eosinophils Relative: 1.3 % (ref 0.0–5.0)
Glucose, Bld: 82 mg/dL (ref 70–99)
HCT: 32 % — ABNORMAL LOW (ref 36.0–46.0)
Hemoglobin: 10.3 g/dL — ABNORMAL LOW (ref 12.0–15.0)
Lymphocytes Relative: 26.2 % (ref 12.0–46.0)
Lymphs Abs: 1.5 10*3/uL (ref 0.7–4.0)
MCHC: 32.3 g/dL (ref 30.0–36.0)
MCV: 77.1 fL — ABNORMAL LOW (ref 78.0–100.0)
Monocytes Absolute: 0.3 10*3/uL (ref 0.1–1.0)
Monocytes Relative: 5.6 % (ref 3.0–12.0)
Neutro Abs: 4 10*3/uL (ref 1.4–7.7)
Neutrophils Relative %: 66.2 % (ref 43.0–77.0)
Phosphorus: 3.7 mg/dL (ref 2.3–4.6)
Platelets: 253 10*3/uL (ref 150.0–400.0)
Potassium: 3.7 meq/L (ref 3.5–5.1)
RBC: 4.15 M/uL (ref 3.87–5.11)
RDW: 12.5 % (ref 11.5–14.6)
Sodium: 145 meq/L (ref 135–145)
WBC: 5.9 10*3/uL (ref 4.5–10.5)

## 2008-11-02 ENCOUNTER — Ambulatory Visit (HOSPITAL_COMMUNITY): Admission: RE | Admit: 2008-11-02 | Discharge: 2008-11-02 | Payer: Self-pay | Admitting: Internal Medicine

## 2008-11-18 IMAGING — CR DG CHEST 2V
2 series · 2 of 2 positions shown · non-contrast
Comparison: 05/18/07.

CLINICAL DATA: Hiatal hernia surgery.  Pleural effusion.  
CHEST ? 2 VIEW:

[view not recorded (1 of 2)]
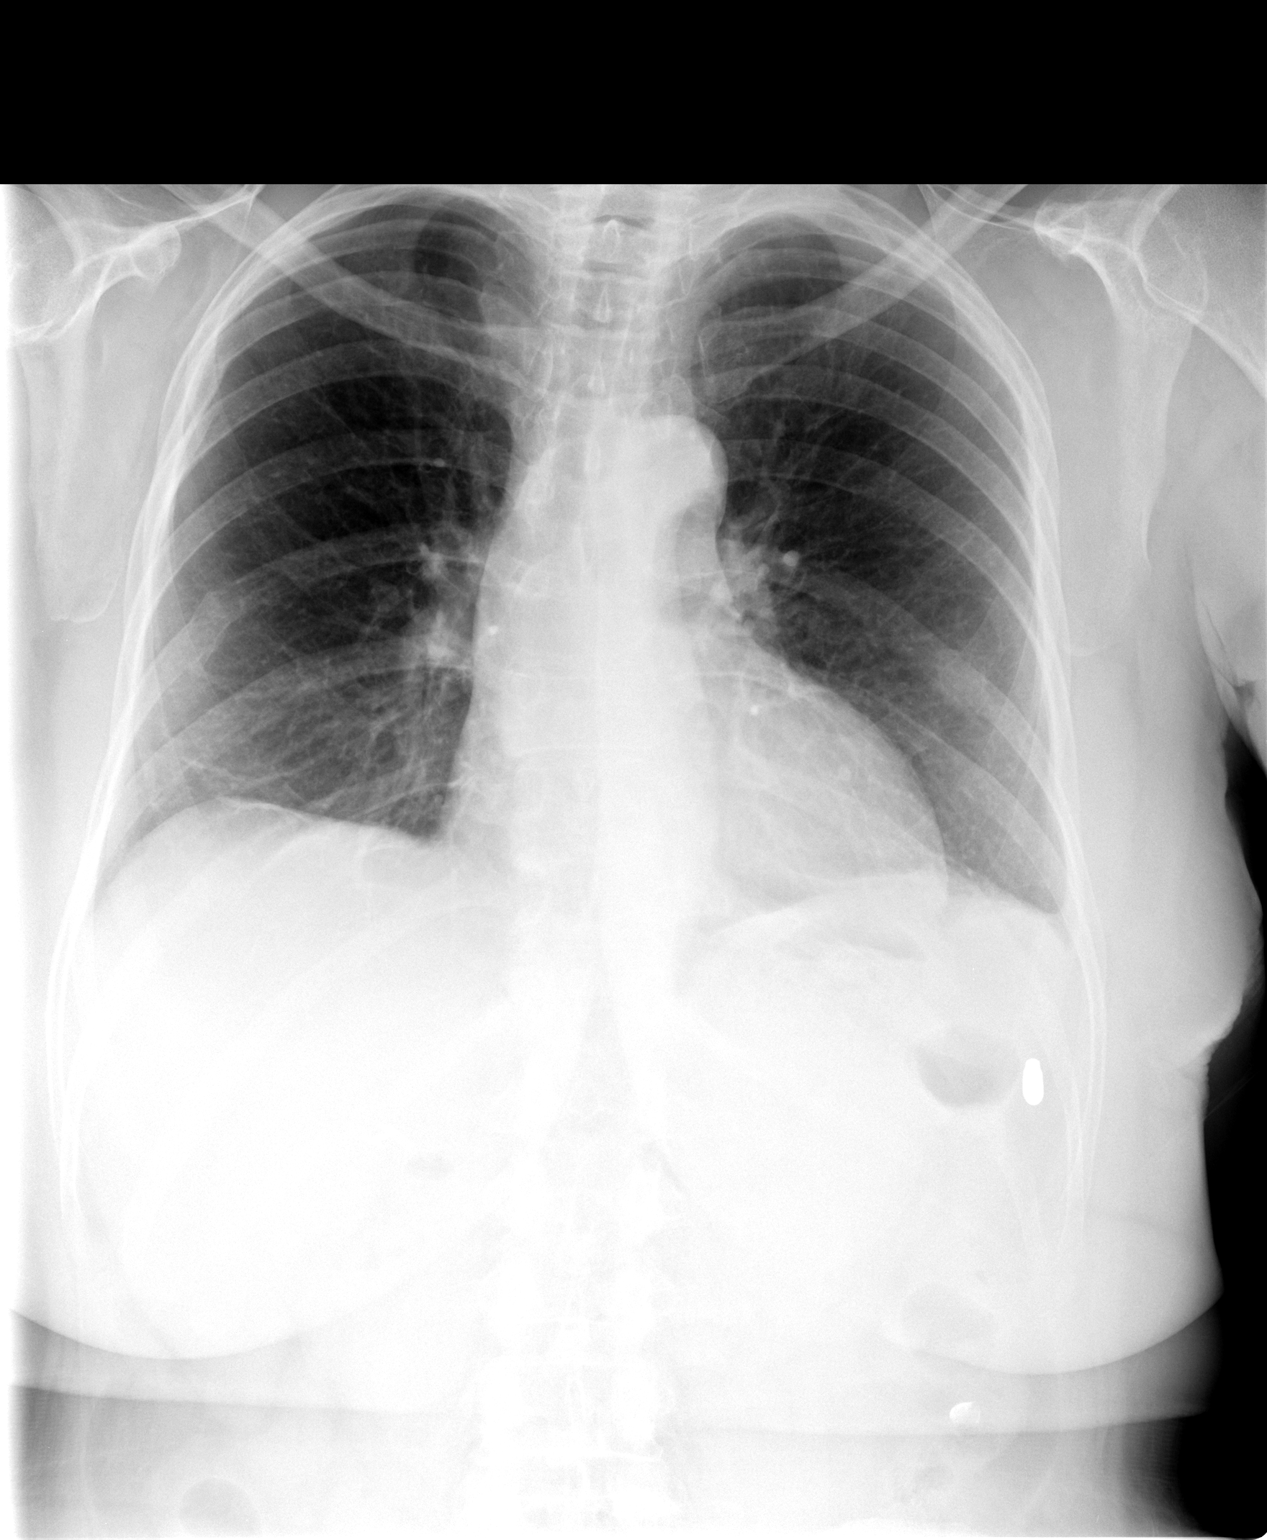

[view not recorded (2 of 2)]
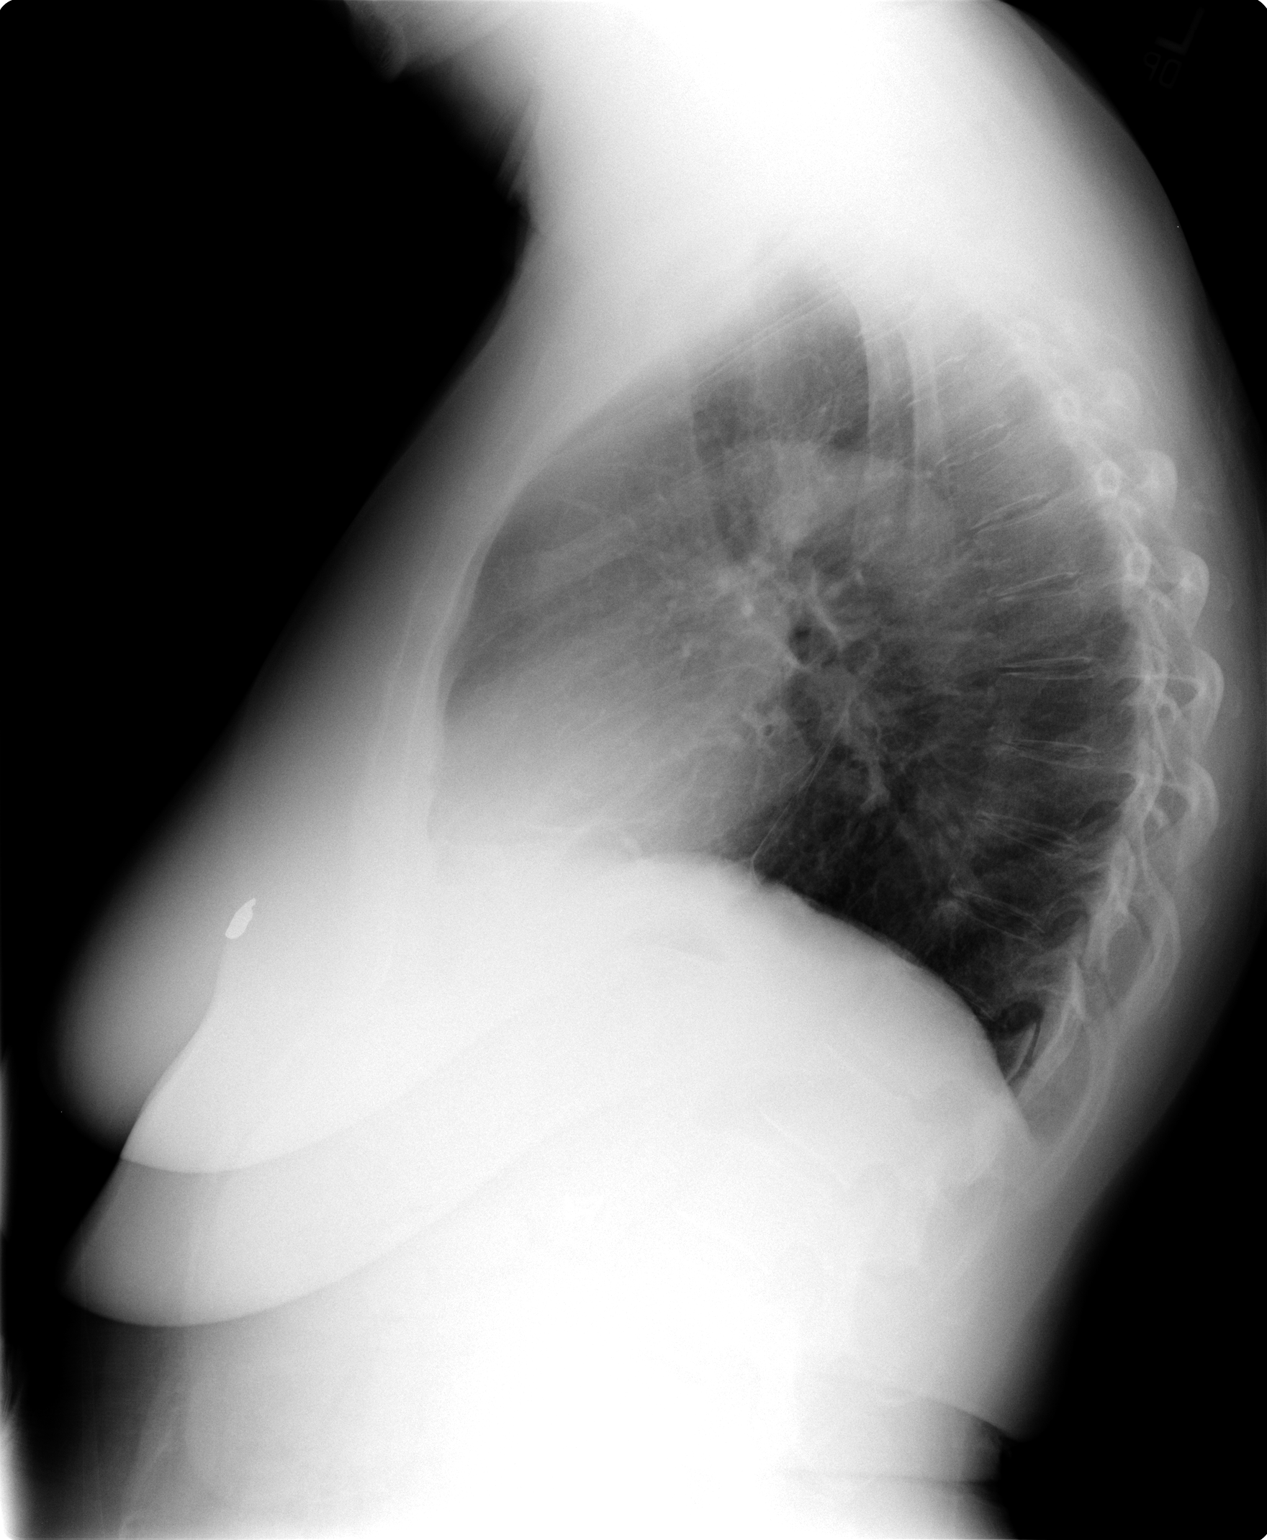

[2 of 2 positions shown; findings below may reference images not displayed]

FINDINGS: Mild low lung volumes are noted.  There is stable scarring or subsegmental atelectasis at both lung bases, along with minimal blunting of the right lateral costophrenic angle but not of the posterior costophrenic angles.  Vague density at the intersection of the left fourth and eighth ribs likely represents some localized atelectasis or scarring but imaging follow-up to exclude an underlying developing pulmonary infiltrate is suggested.
IMPRESSION: 1.  Stable bibasilar subsegmental atelectasis.  
2.  Faint left mid lung opacity likely represents some localized atelectasis but merits follow-up to ensure clearance.

## 2008-12-10 ENCOUNTER — Encounter: Admission: RE | Admit: 2008-12-10 | Discharge: 2008-12-10 | Payer: Self-pay | Admitting: Family Medicine

## 2008-12-22 ENCOUNTER — Encounter: Payer: Self-pay | Admitting: Internal Medicine

## 2008-12-24 ENCOUNTER — Telehealth: Payer: Self-pay | Admitting: Internal Medicine

## 2008-12-26 ENCOUNTER — Ambulatory Visit: Payer: Self-pay | Admitting: Internal Medicine

## 2008-12-26 ENCOUNTER — Encounter (INDEPENDENT_AMBULATORY_CARE_PROVIDER_SITE_OTHER): Payer: Self-pay | Admitting: Internal Medicine

## 2008-12-26 LAB — CONVERTED CEMR LAB
Basophils Absolute: 0 10*3/uL (ref 0.0–0.1)
Basophils Relative: 0 % (ref 0–1)
Eosinophils Absolute: 0.1 10*3/uL (ref 0.0–0.7)
Eosinophils Relative: 2 % (ref 0–5)
HCT: 35.4 % — ABNORMAL LOW (ref 36.0–46.0)
Hemoglobin: 11.4 g/dL — ABNORMAL LOW (ref 12.0–15.0)
Lymphocytes Relative: 33 % (ref 12–46)
Lymphs Abs: 2.2 10*3/uL (ref 0.7–4.0)
MCHC: 32.2 g/dL (ref 30.0–36.0)
MCV: 72.2 fL — ABNORMAL LOW (ref 78.0–100.0)
Microalb, Ur: 1.14 mg/dL (ref 0.00–1.89)
Monocytes Absolute: 0.5 10*3/uL (ref 0.1–1.0)
Monocytes Relative: 7 % (ref 3–12)
Neutro Abs: 4 10*3/uL (ref 1.7–7.7)
Neutrophils Relative %: 58 % (ref 43–77)
Platelets: 289 10*3/uL (ref 150–400)
RBC: 4.9 M/uL (ref 3.87–5.11)
RDW: 14 % (ref 11.5–15.5)
WBC: 6.9 10*3/uL (ref 4.0–10.5)

## 2009-01-10 ENCOUNTER — Telehealth: Payer: Self-pay | Admitting: Internal Medicine

## 2009-01-13 IMAGING — CR DG CHEST 2V
2 series · 2 of 2 positions shown · non-contrast
Comparison: 06/29/2007

CLINICAL DATA: Hiatal hernia, postop

CHEST - 2 VIEW

[w chest pa]
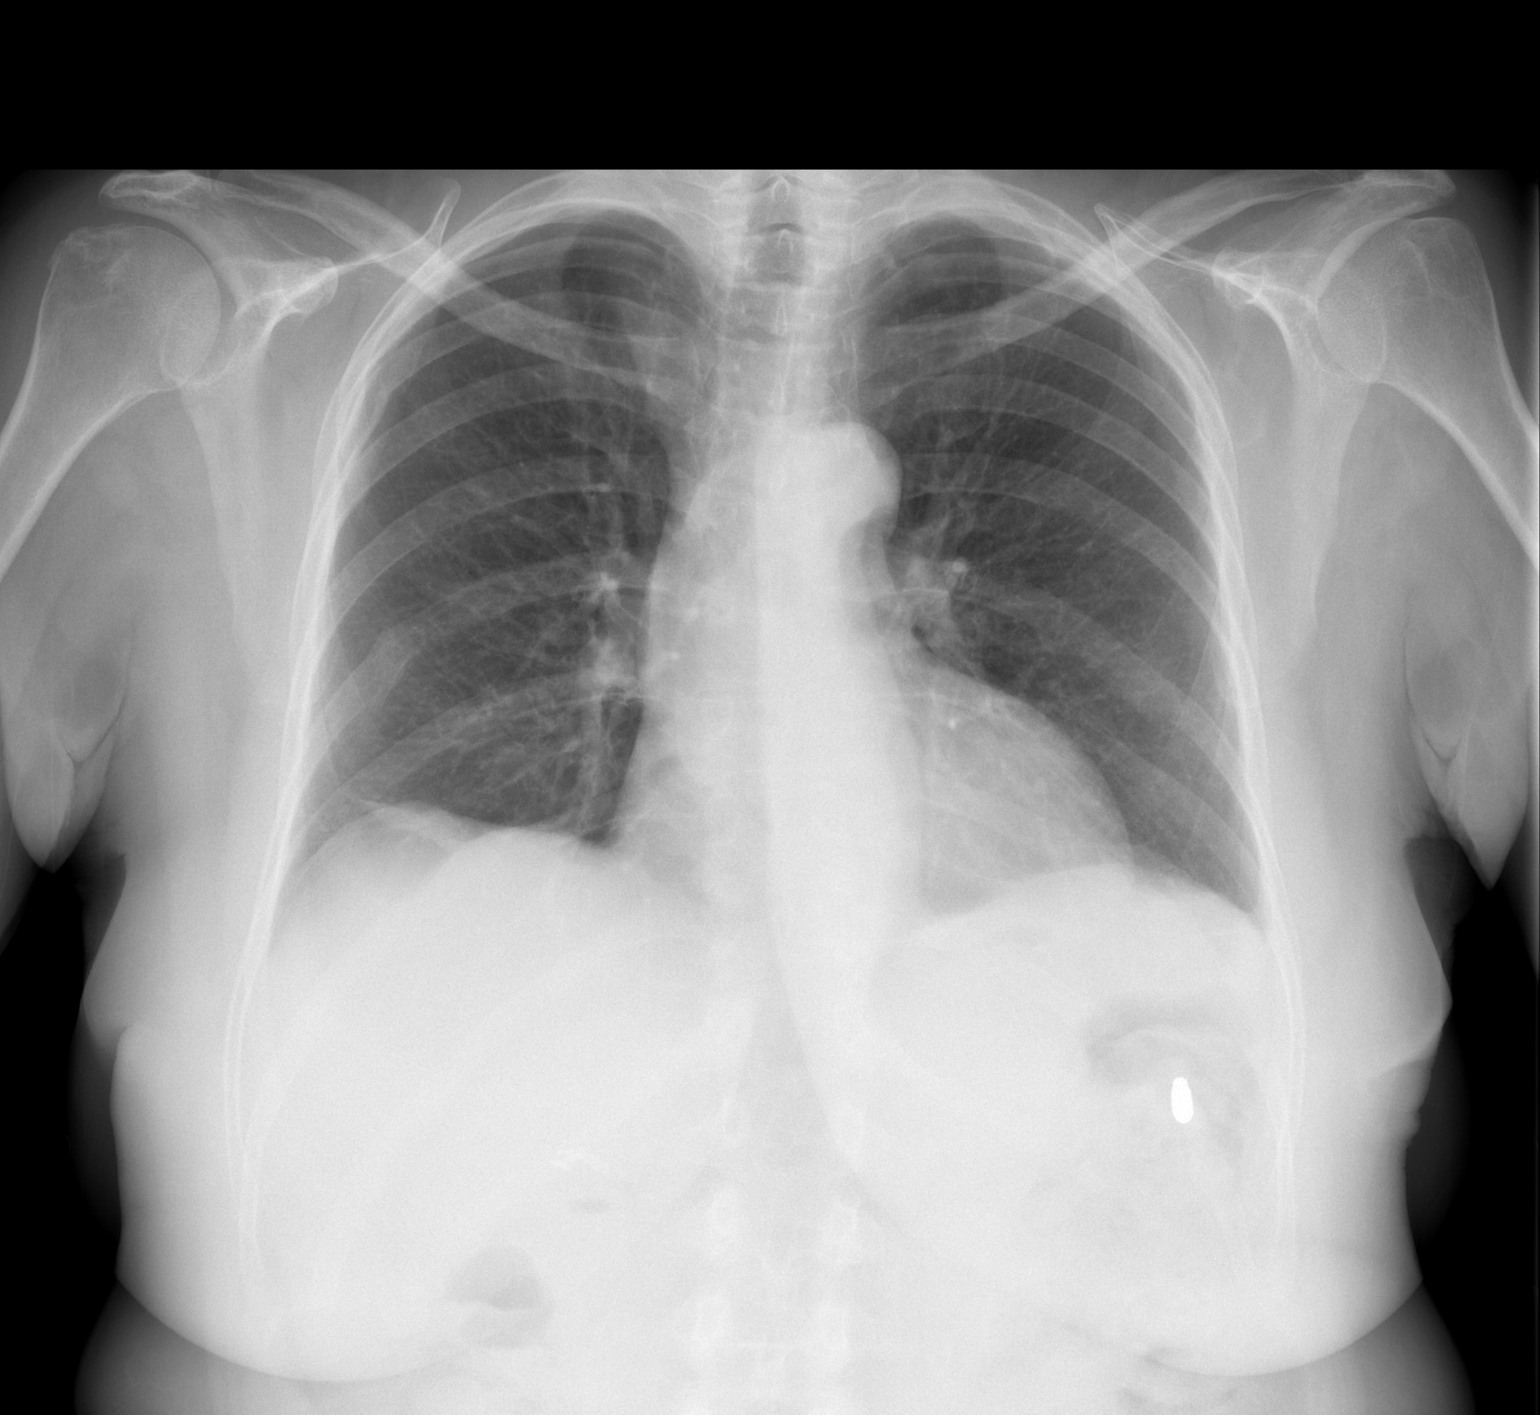

[w chest lat]
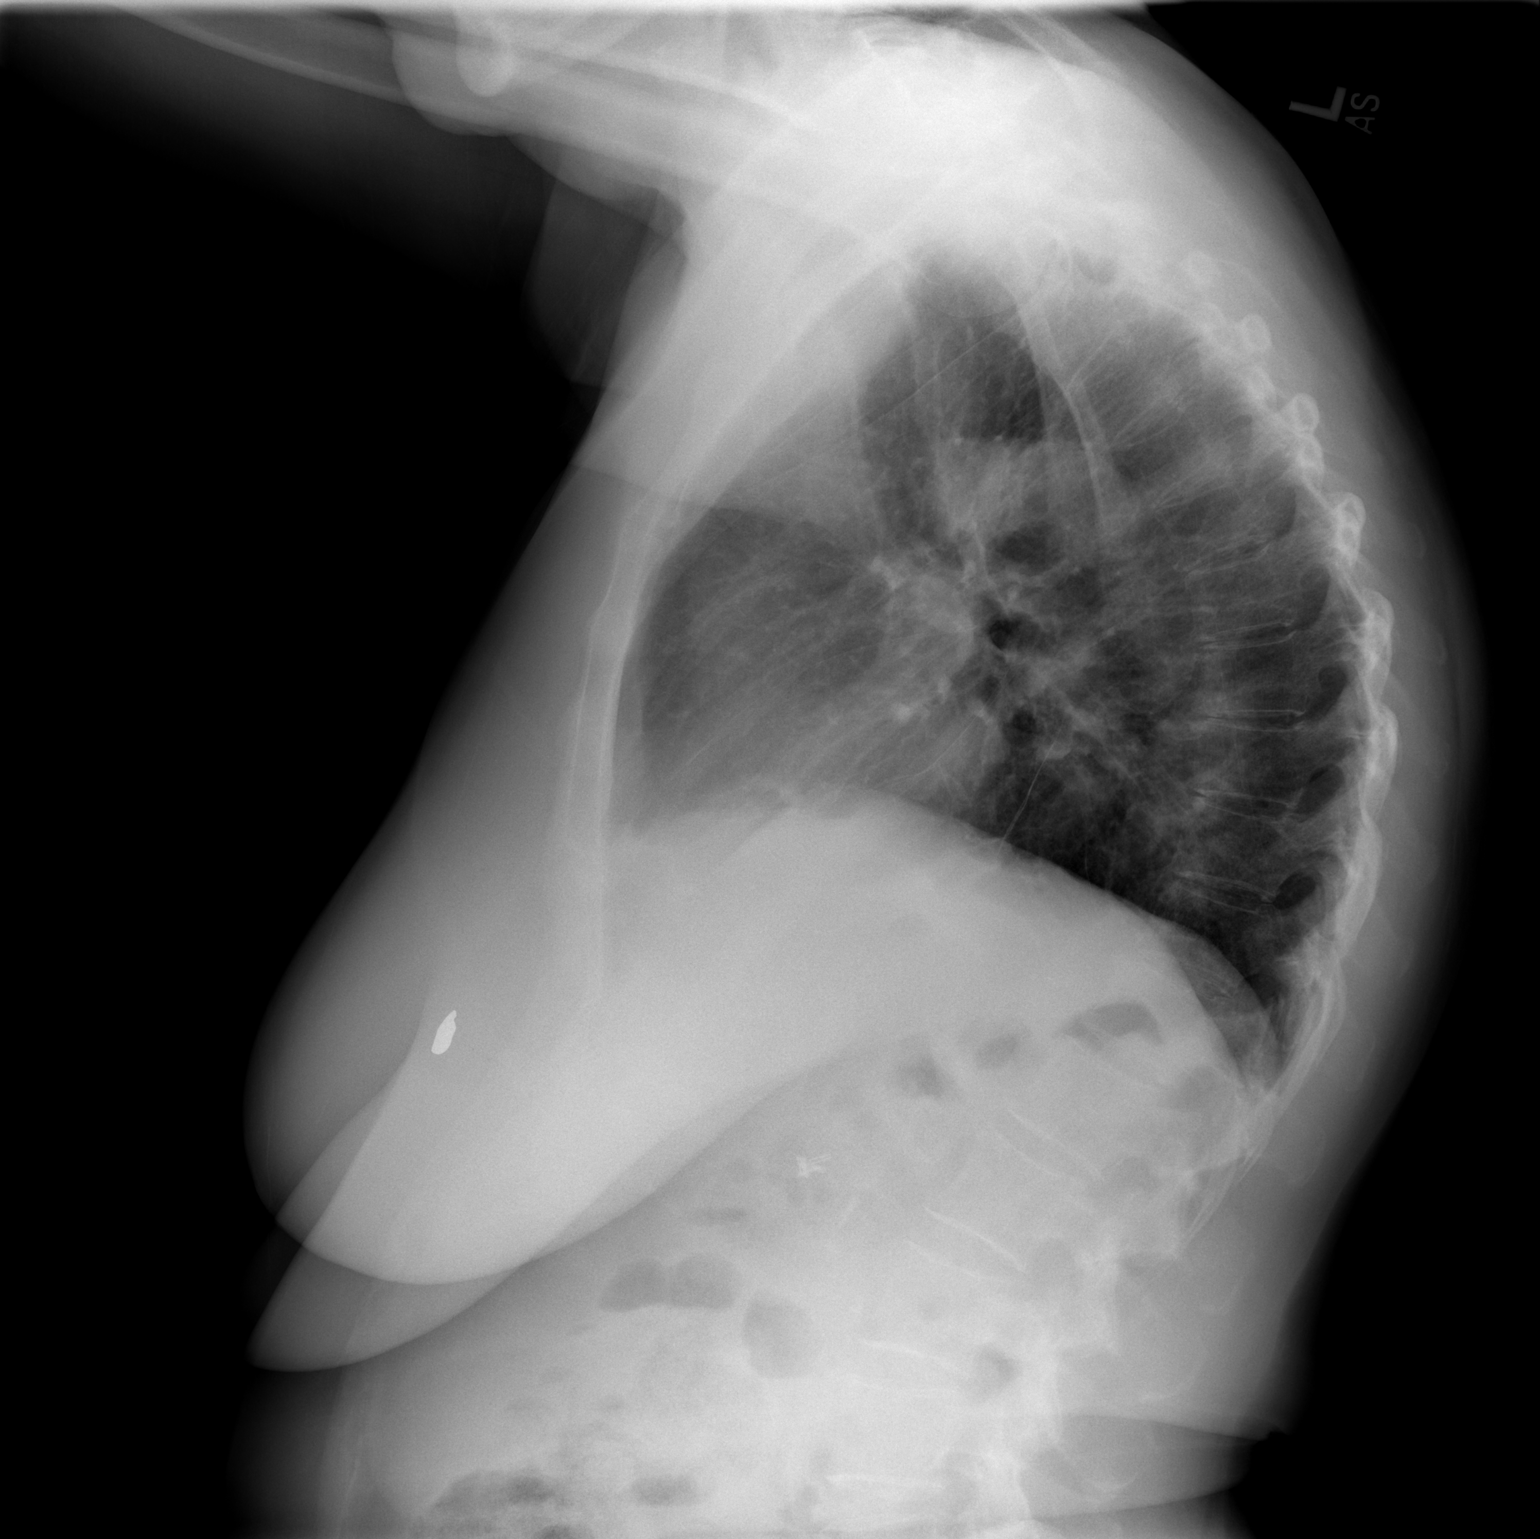

[2 of 2 positions shown; findings below may reference images not displayed]

FINDINGS: Faint opacity previously noted in the left mid lung no
longer visualized.  Linear densities at the right lung base likely
represent scarring.  Heart is borderline in size.  Mild tortuosity
of the thoracic aorta.  No effusions or acute bony abnormality.
IMPRESSION: Resolution of previously noted faint opacity in the left mid lung.

Right base scarring.

Borderline heart size.

## 2009-02-21 ENCOUNTER — Ambulatory Visit: Payer: Self-pay | Admitting: Obstetrics and Gynecology

## 2009-03-02 ENCOUNTER — Encounter: Payer: Self-pay | Admitting: Internal Medicine

## 2009-03-07 ENCOUNTER — Encounter: Payer: Self-pay | Admitting: Internal Medicine

## 2009-03-08 ENCOUNTER — Encounter: Payer: Self-pay | Admitting: Internal Medicine

## 2009-03-08 ENCOUNTER — Telehealth: Payer: Self-pay | Admitting: Cardiology

## 2009-03-08 ENCOUNTER — Telehealth: Payer: Self-pay | Admitting: Internal Medicine

## 2009-03-13 ENCOUNTER — Encounter: Payer: Self-pay | Admitting: Cardiology

## 2009-03-13 ENCOUNTER — Encounter: Payer: Self-pay | Admitting: Internal Medicine

## 2009-03-27 ENCOUNTER — Ambulatory Visit: Payer: Self-pay | Admitting: Obstetrics and Gynecology

## 2009-03-27 ENCOUNTER — Other Ambulatory Visit: Admission: RE | Admit: 2009-03-27 | Discharge: 2009-03-27 | Payer: Self-pay | Admitting: Obstetrics and Gynecology

## 2009-04-02 ENCOUNTER — Encounter (INDEPENDENT_AMBULATORY_CARE_PROVIDER_SITE_OTHER): Payer: Self-pay | Admitting: Internal Medicine

## 2009-04-02 ENCOUNTER — Ambulatory Visit: Payer: Self-pay | Admitting: Internal Medicine

## 2009-04-02 LAB — CONVERTED CEMR LAB
Basophils Absolute: 0 10*3/uL (ref 0.0–0.1)
Basophils Relative: 0 % (ref 0–1)
Eosinophils Absolute: 0.1 10*3/uL (ref 0.0–0.7)
Eosinophils Relative: 1 % (ref 0–5)
HCT: 34.6 % — ABNORMAL LOW (ref 36.0–46.0)
Hemoglobin: 10.8 g/dL — ABNORMAL LOW (ref 12.0–15.0)
Lymphocytes Relative: 34 % (ref 12–46)
Lymphs Abs: 2.2 10*3/uL (ref 0.7–4.0)
MCHC: 31.2 g/dL (ref 30.0–36.0)
MCV: 76.5 fL — ABNORMAL LOW (ref 78.0–100.0)
Monocytes Absolute: 0.4 10*3/uL (ref 0.1–1.0)
Monocytes Relative: 6 % (ref 3–12)
Neutro Abs: 3.9 10*3/uL (ref 1.7–7.7)
Neutrophils Relative %: 59 % (ref 43–77)
Platelets: 278 10*3/uL (ref 150–400)
RBC: 4.52 M/uL (ref 3.87–5.11)
RDW: 13.7 % (ref 11.5–15.5)
WBC: 6.7 10*3/uL (ref 4.0–10.5)

## 2009-04-16 ENCOUNTER — Inpatient Hospital Stay (HOSPITAL_COMMUNITY): Admission: RE | Admit: 2009-04-16 | Discharge: 2009-05-31 | Payer: Self-pay | Admitting: General Surgery

## 2009-04-16 ENCOUNTER — Ambulatory Visit: Payer: Self-pay | Admitting: Cardiology

## 2009-04-18 ENCOUNTER — Encounter (INDEPENDENT_AMBULATORY_CARE_PROVIDER_SITE_OTHER): Payer: Self-pay | Admitting: *Deleted

## 2009-04-22 ENCOUNTER — Encounter: Payer: Self-pay | Admitting: Cardiology

## 2009-04-29 IMAGING — MG MM DIAGNOSTIC BILATERAL
1 series · 1 of 1 positions shown · non-contrast
Comparison: 06/09/2007 and 12/15/2006

CLINICAL DATA: Follow-up right breast calcifications and annual
mammogram.

DIGITAL DIAGNOSTIC BILATERAL MAMMOGRAM WITH CAD

[L MLO]
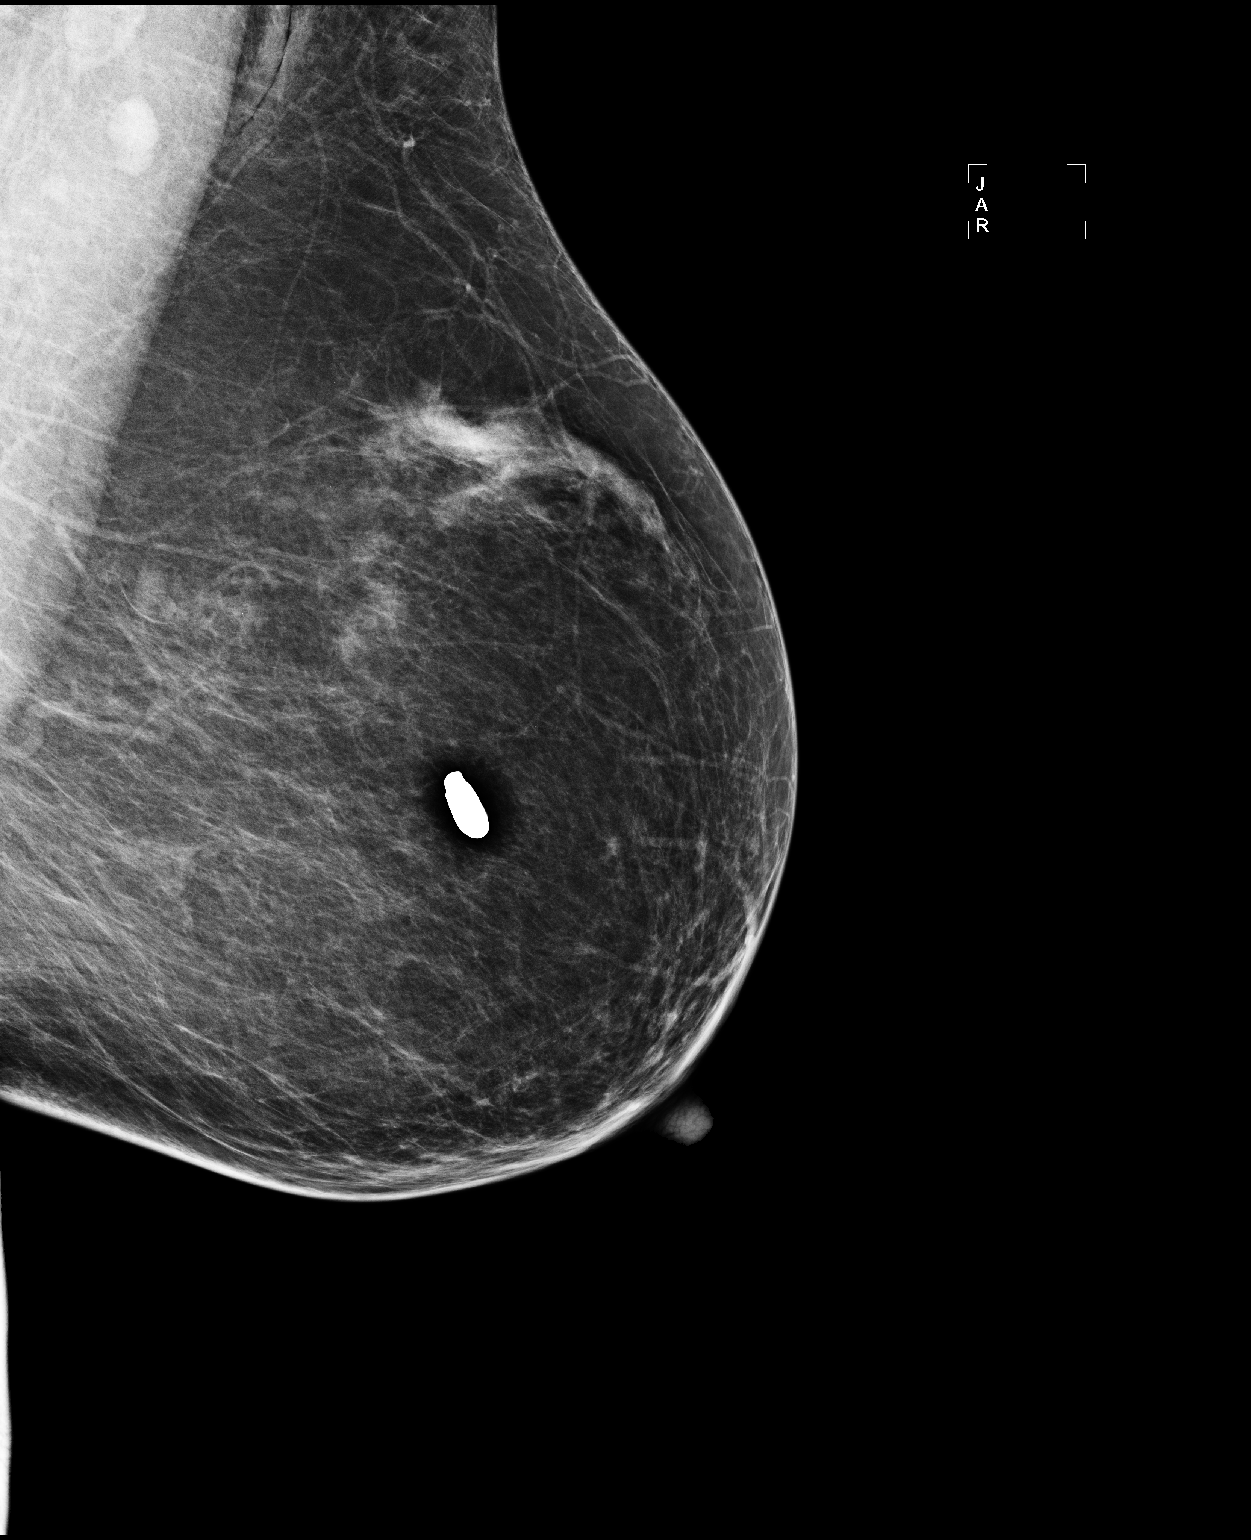

[1 of 1 positions shown; findings below may reference images not displayed]

FINDINGS: The breast tissue is primarily fatty.
Stable benign-appearing calcifications within the upper central
right breast are again identified.
There is no evidence of mass, suspicious calcifications, or areas
of distortion identified.
IMPRESSION: Stable benign appearing calcifications within the upper central
right breast.

No specific evidence of malignancy..

These findings were discussed with the patient.  She was encouraged
to continue monthly self exams and to contact the [REDACTED] or
primary physician if any changes noted.

BI-RADS CATEGORY 2:  Benign finding(s).

Recommend bilateral screening mammogram in 1 year.

## 2009-05-04 IMAGING — CR DG CHEST 2V
2 series · 2 of 2 positions shown · non-contrast
Comparison: Chest x-ray of 08/24/2007

CLINICAL DATA: Hiatal hernia repair February 2007, short of breath
with cough

CHEST - 2 VIEW

[w chest pa]
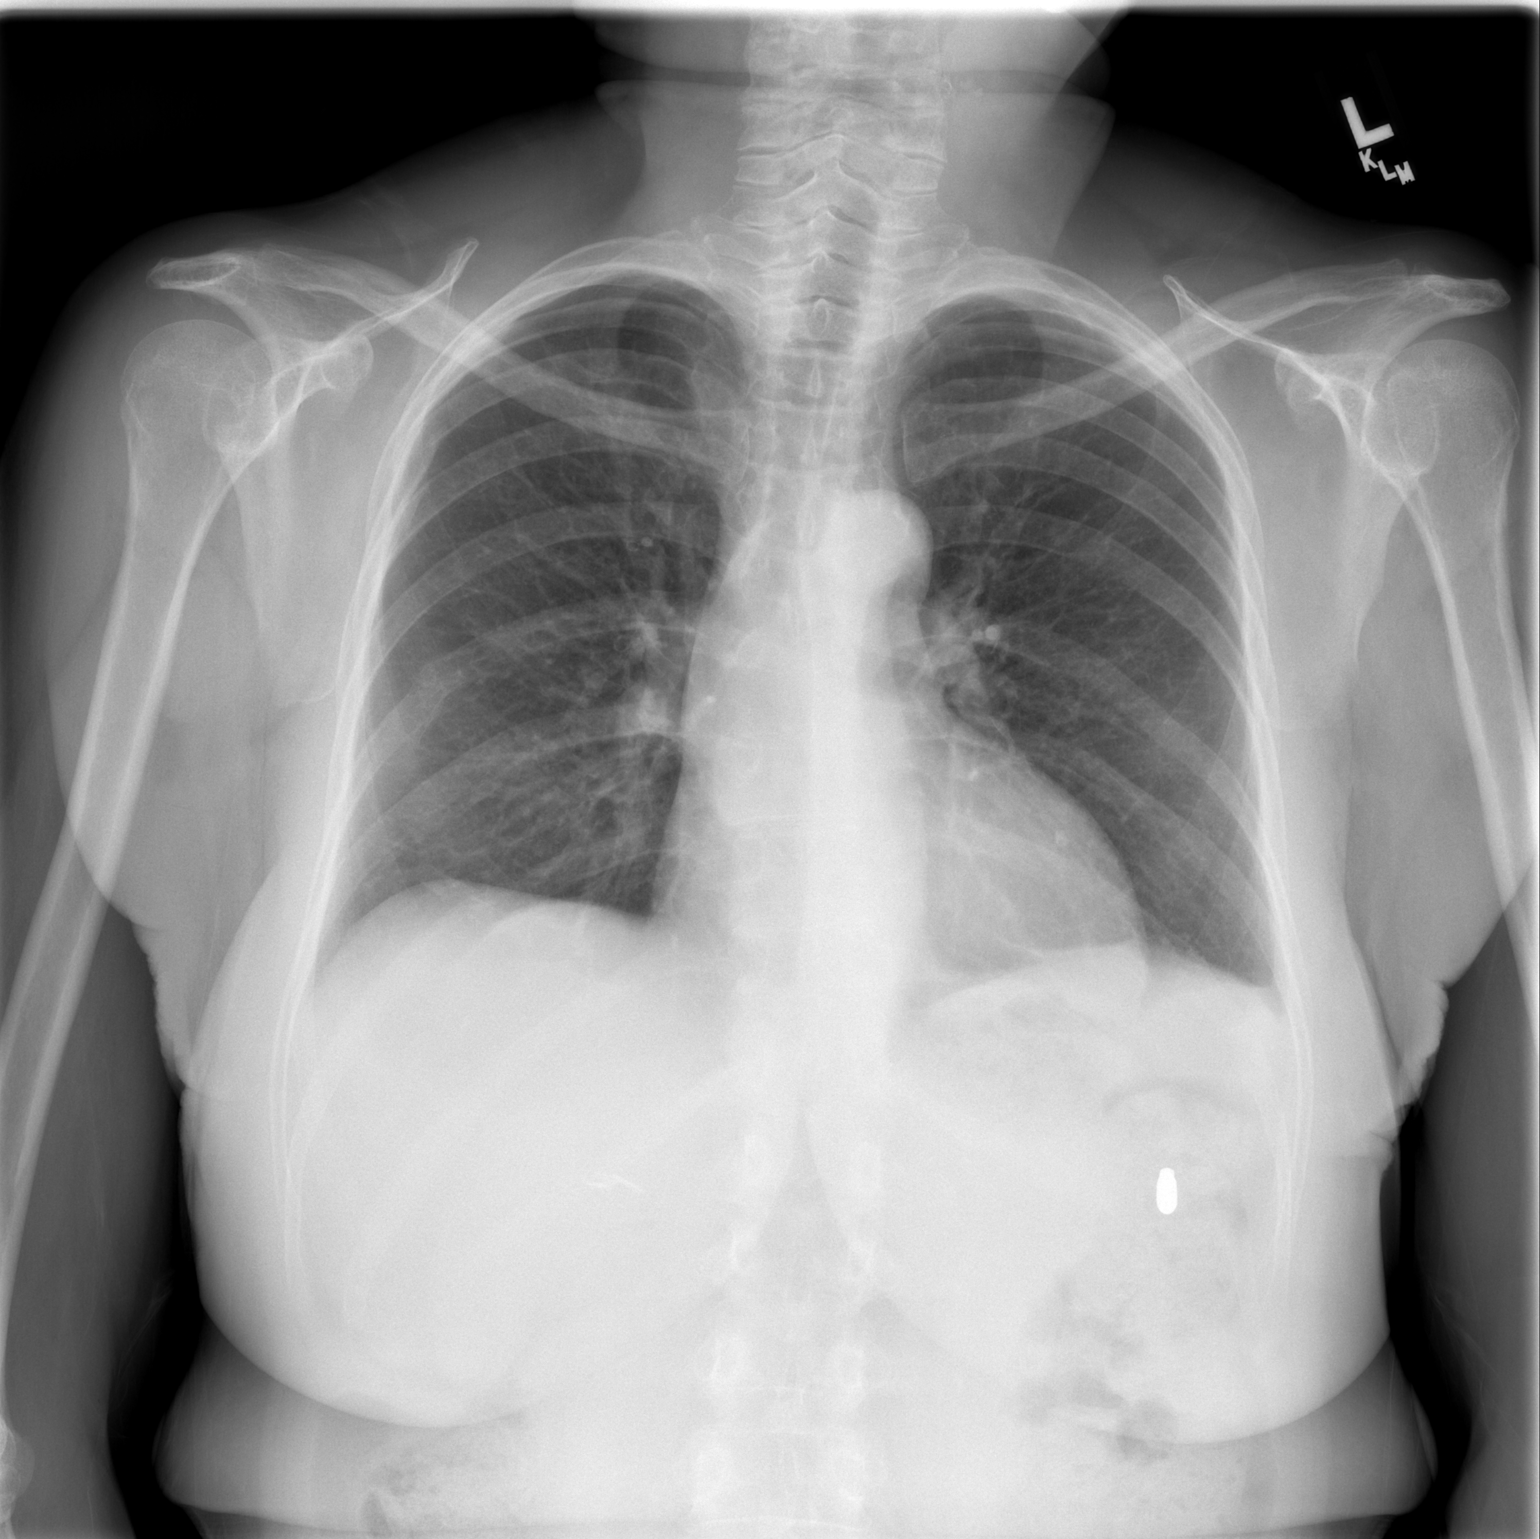

[w chest lat]
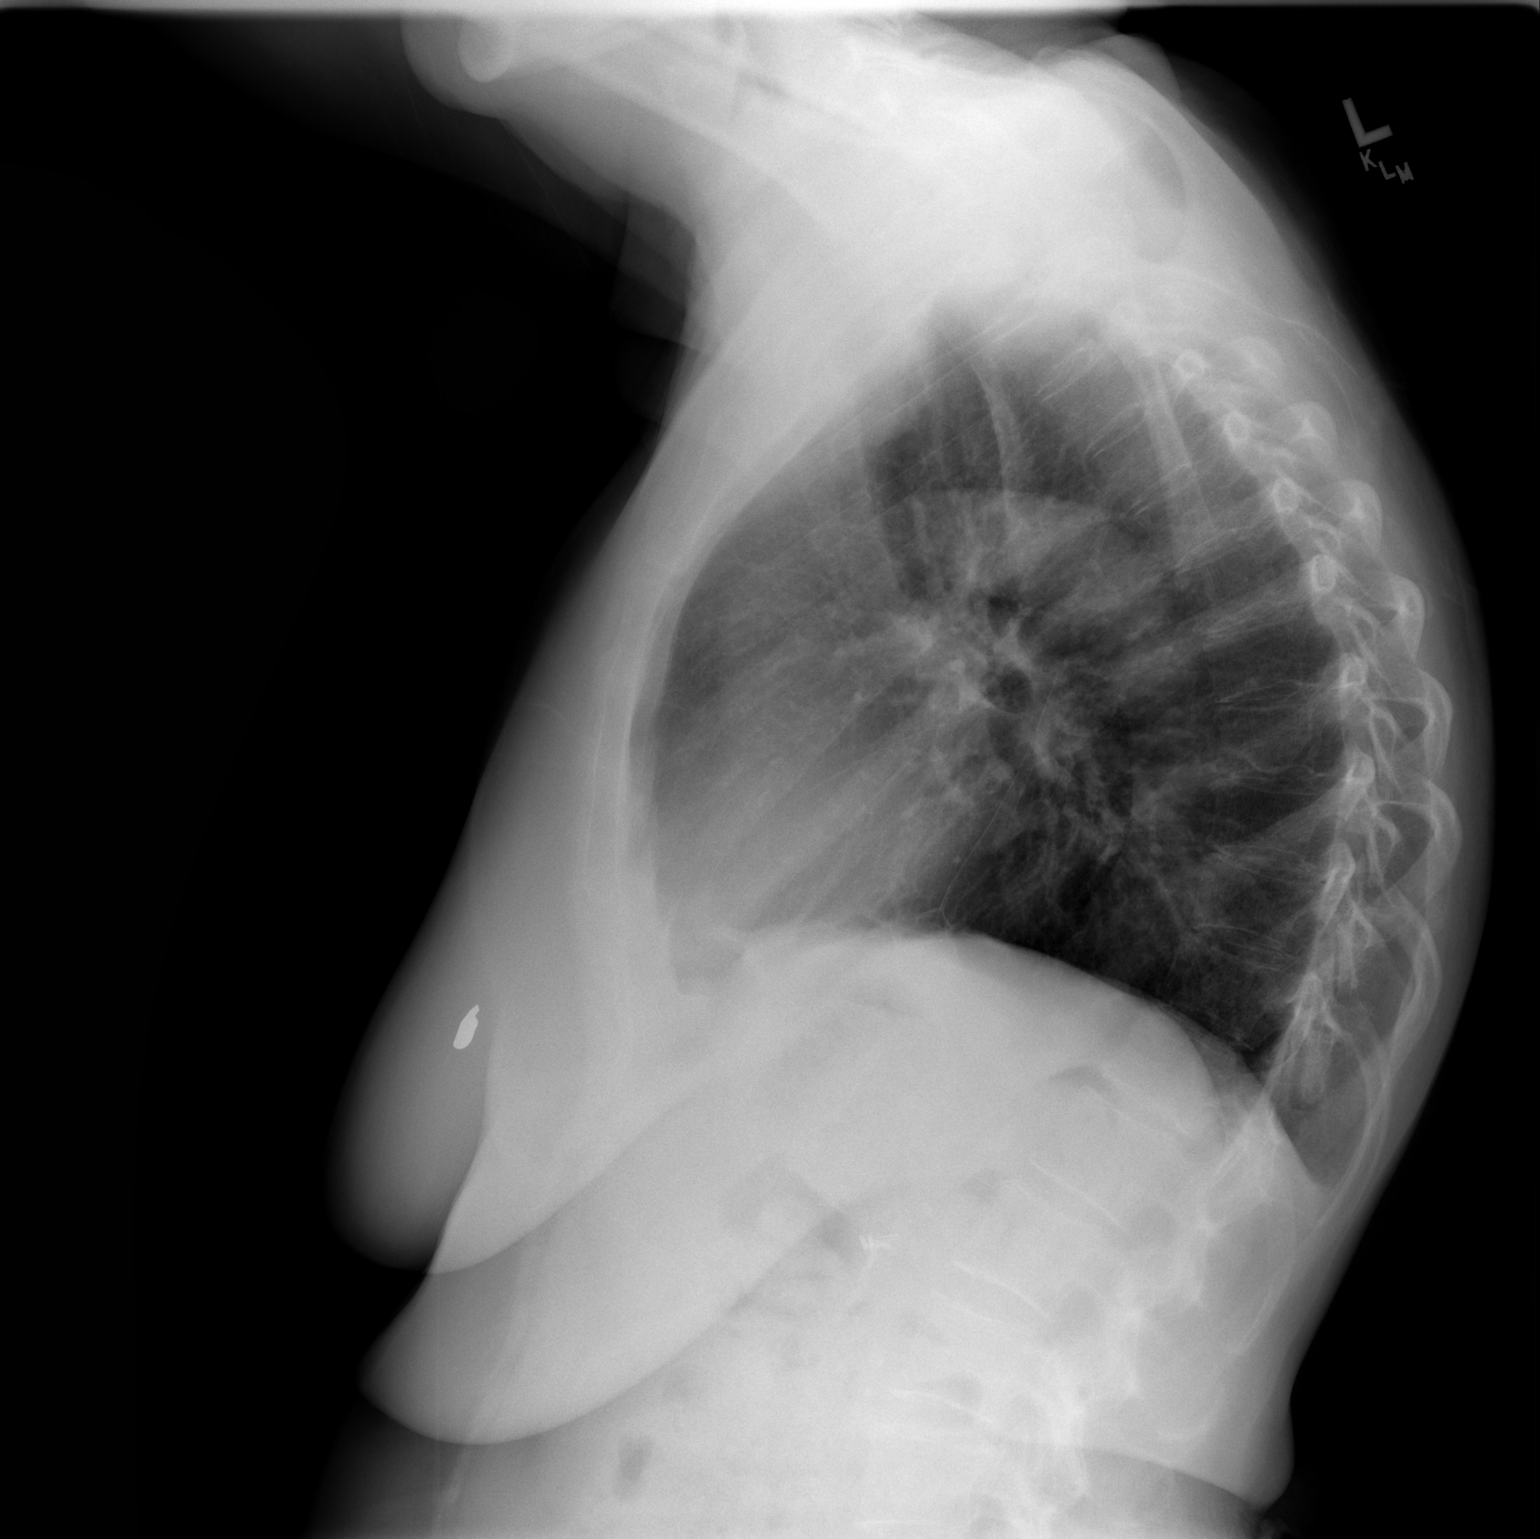

[2 of 2 positions shown; findings below may reference images not displayed]

FINDINGS: The lungs are clear.  Heart is within upper limits of
normal.  No bony abnormality is seen. Metallic foreign body within
the left breast tissue is unchanged.
IMPRESSION: No active lung disease.

## 2009-05-09 ENCOUNTER — Encounter (INDEPENDENT_AMBULATORY_CARE_PROVIDER_SITE_OTHER): Payer: Self-pay | Admitting: *Deleted

## 2009-05-10 ENCOUNTER — Encounter (INDEPENDENT_AMBULATORY_CARE_PROVIDER_SITE_OTHER): Payer: Self-pay | Admitting: *Deleted

## 2009-05-12 ENCOUNTER — Encounter (INDEPENDENT_AMBULATORY_CARE_PROVIDER_SITE_OTHER): Payer: Self-pay | Admitting: *Deleted

## 2009-05-20 ENCOUNTER — Ambulatory Visit: Payer: Self-pay | Admitting: Gastroenterology

## 2009-05-21 ENCOUNTER — Encounter (INDEPENDENT_AMBULATORY_CARE_PROVIDER_SITE_OTHER): Payer: Self-pay | Admitting: *Deleted

## 2009-05-22 ENCOUNTER — Encounter: Payer: Self-pay | Admitting: Gastroenterology

## 2009-05-24 ENCOUNTER — Encounter: Payer: Self-pay | Admitting: Gastroenterology

## 2009-05-24 IMAGING — US US TRANSVAGINAL NON-OB
1 series · 14 of 25 positions shown · non-contrast
Comparison: None

CLINICAL DATA: Pelvic pain and tenderness.  Post menopausal female.

TRANSABDOMINAL AND TRANSVAGINAL ULTRASOUND OF PELVIS
TECHNIQUE: Both transabdominal and transvaginal ultrasound
examinations of the pelvis were performed including evaluation of
the uterus, ovaries, adnexal regions, and pelvic cul-de-sac.

[Series 1: us transvaginal non-ob · 0.20mm/px · 14 of 51 slices shown]
[im 1/51]
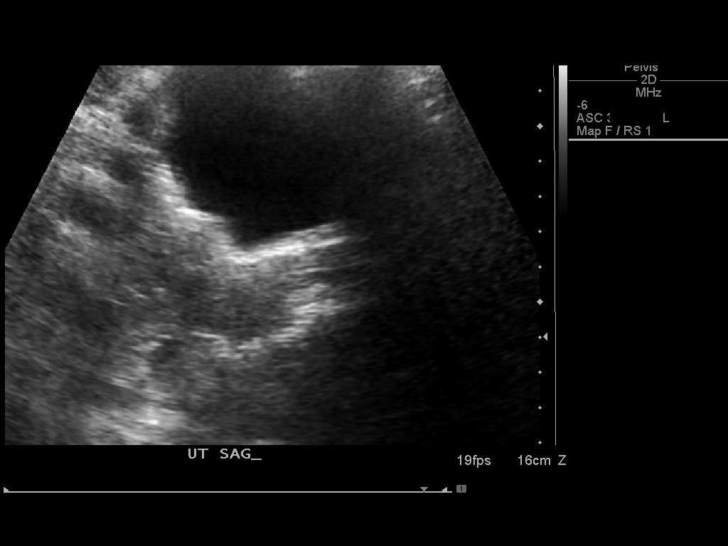
[im 5/51]
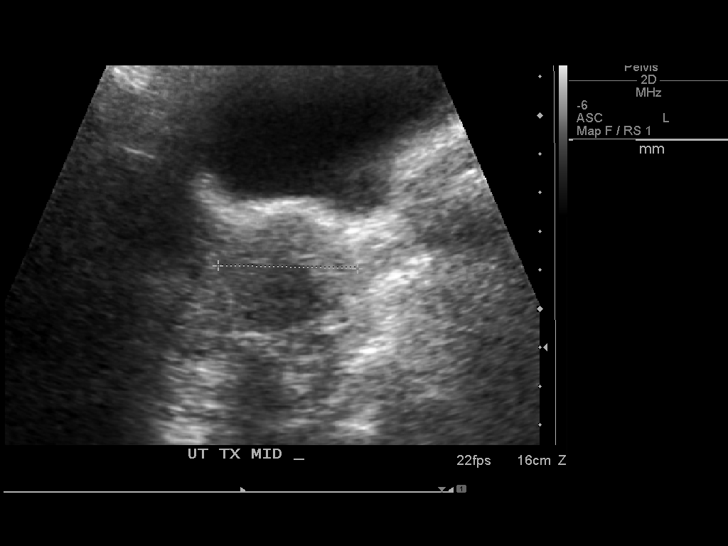
[im 9/51]
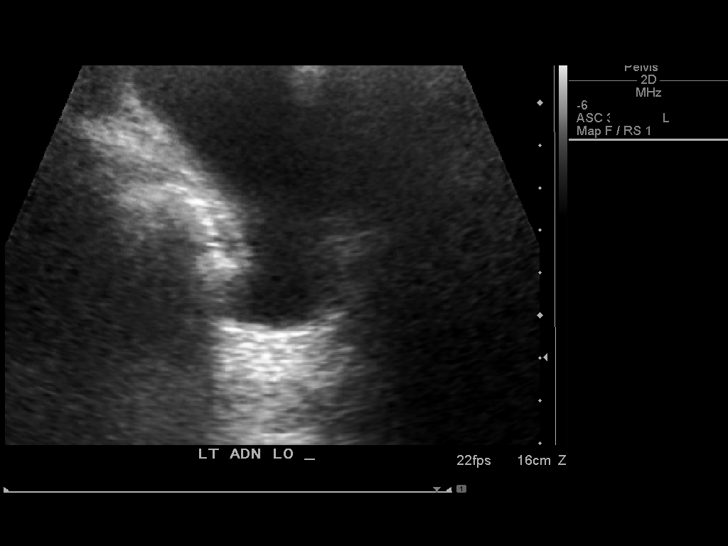
[im 13/51]
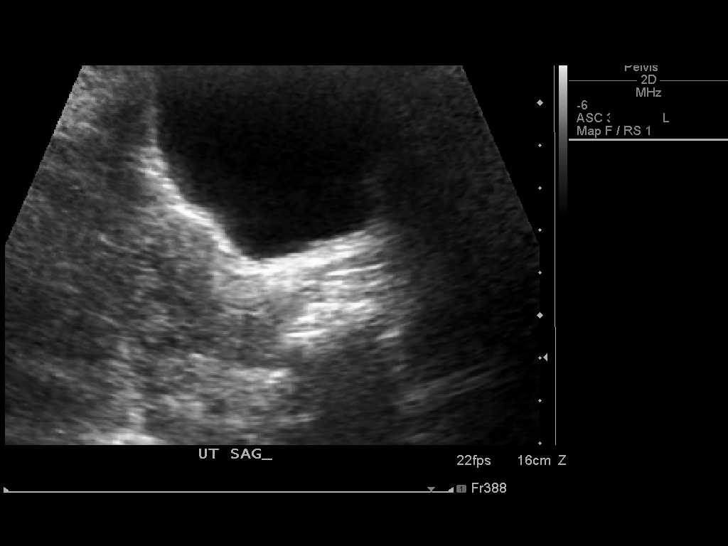
[im 17/51]
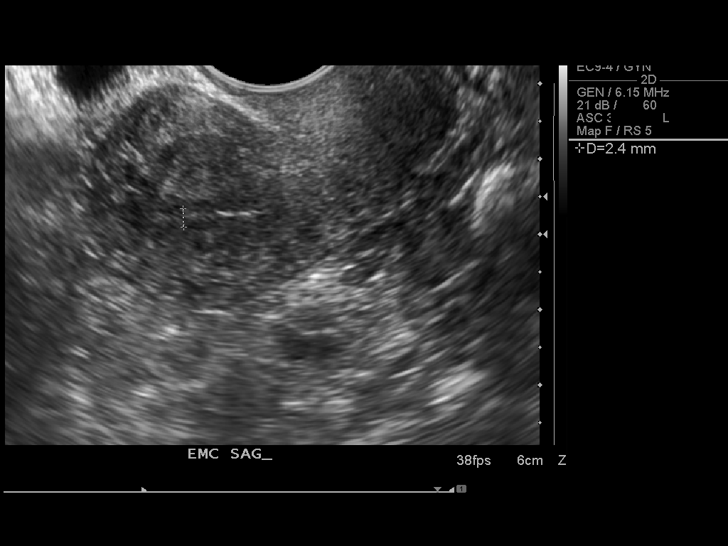
[im 19/51]
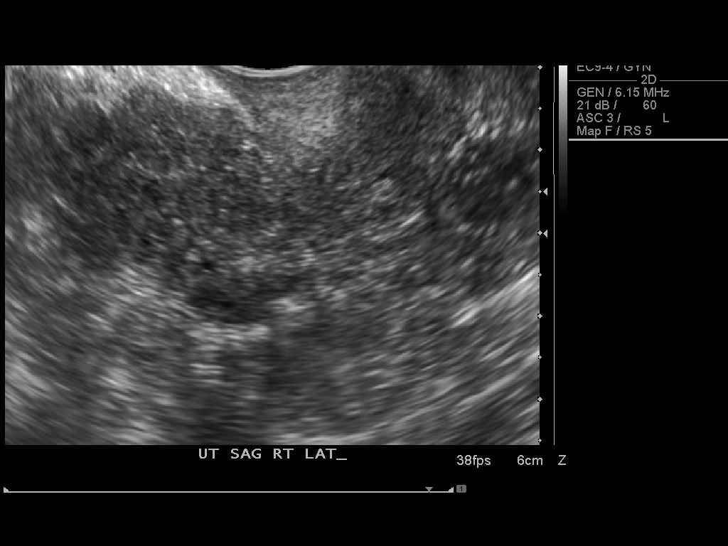
[im 23/51]
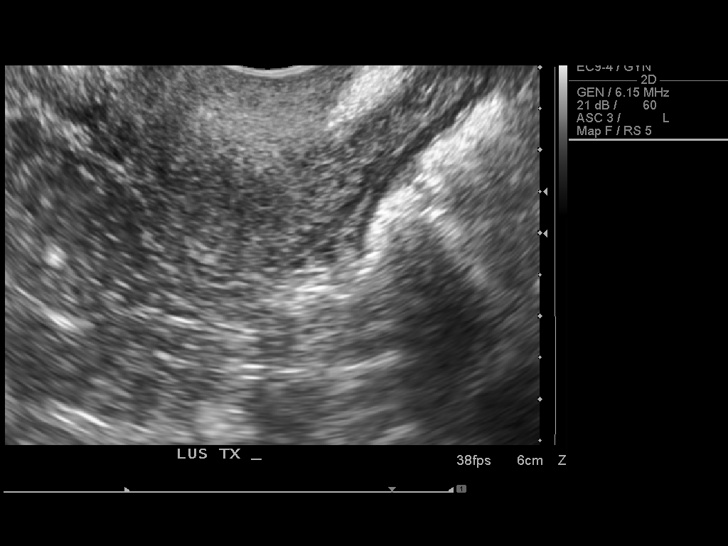
[im 28/51]
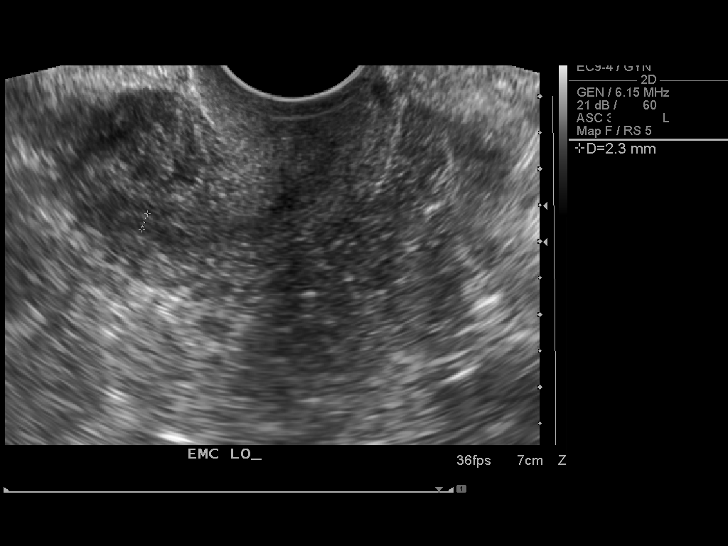
[im 32/51]
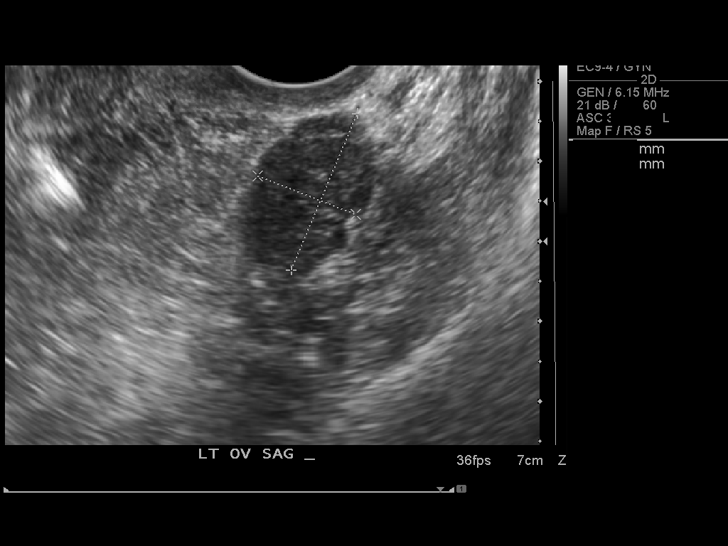
[im 34/51]
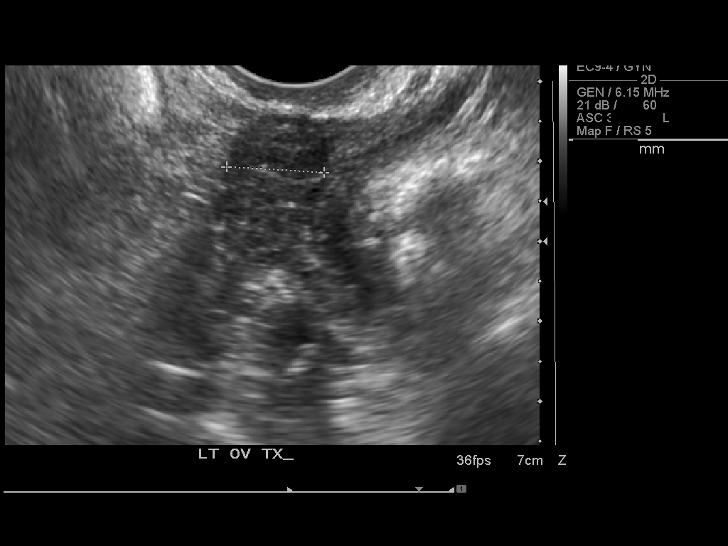
[im 38/51]
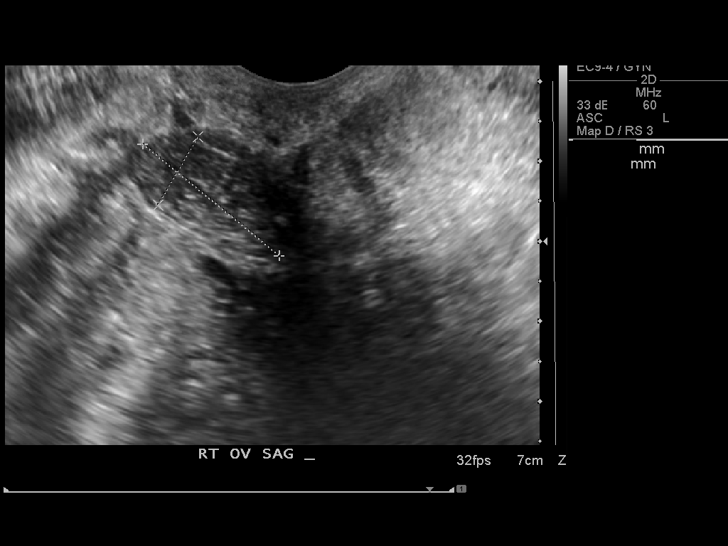
[im 42/51]
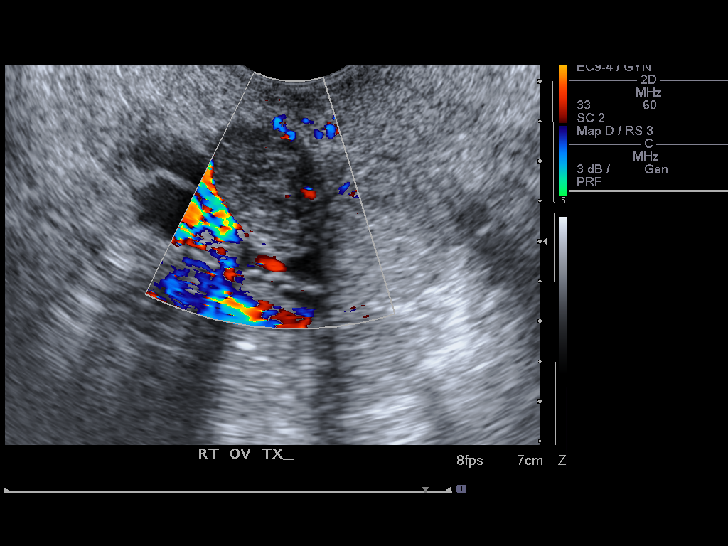
[im 46/51]
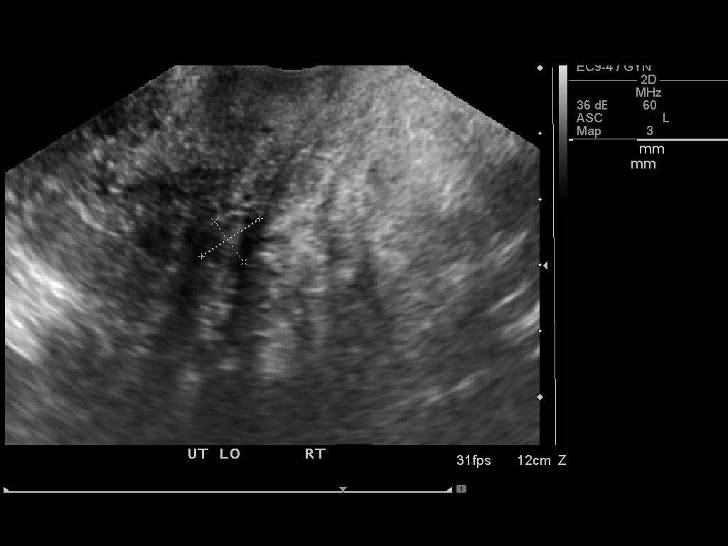
[im 51/51]
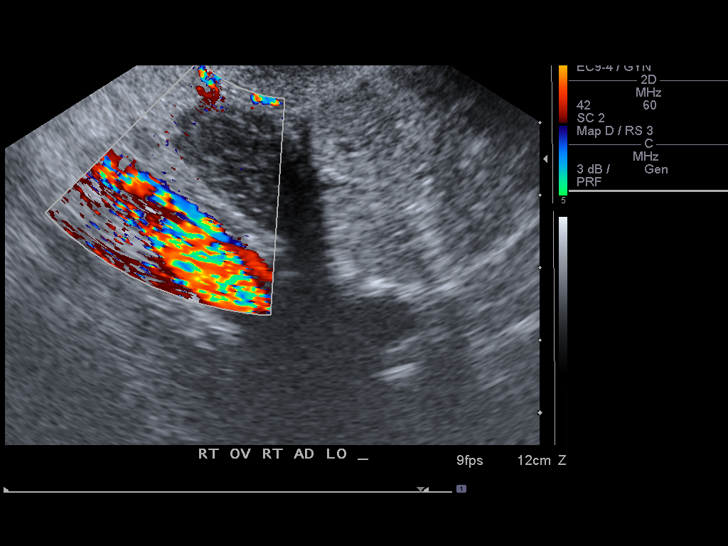

[14 of 25 positions shown; findings below may reference images not displayed]

FINDINGS: The uterus measures approximates 4.6 cm length by 1.9 cm
AP diameter by 3.6 cm in transverse diameter.  A fibroid is seen in
the anterior uterine body measuring 1.4 cm.  A second tiny fibroid
is seen in the posterior lower uterine segment measuring 1.1 cm in
greatest diameter.  Double endometrial thickness measures 2 mm on
transvaginal sonography.

Small postmenopausal ovaries are seen bilaterally which are normal
appearance.  No adnexal masses or free fluid identified by
transabdominal or transvaginal sonography.
IMPRESSION: 1.  Small uterine fibroids, largest measuring 1.4 cm.  Normal
endometrial thickness measuring 2 mm.
2.  Normal postmenopausal ovaries.  No evidence of adnexal mass or
free fluid.

## 2009-05-29 ENCOUNTER — Encounter (INDEPENDENT_AMBULATORY_CARE_PROVIDER_SITE_OTHER): Payer: Self-pay | Admitting: *Deleted

## 2009-05-31 ENCOUNTER — Encounter (INDEPENDENT_AMBULATORY_CARE_PROVIDER_SITE_OTHER): Payer: Self-pay | Admitting: *Deleted

## 2009-06-03 ENCOUNTER — Telehealth: Payer: Self-pay | Admitting: Internal Medicine

## 2009-06-03 ENCOUNTER — Ambulatory Visit: Payer: Self-pay | Admitting: Internal Medicine

## 2009-06-03 DIAGNOSIS — M1990 Unspecified osteoarthritis, unspecified site: Secondary | ICD-10-CM | POA: Insufficient documentation

## 2009-06-03 DIAGNOSIS — R112 Nausea with vomiting, unspecified: Secondary | ICD-10-CM | POA: Insufficient documentation

## 2009-06-03 LAB — CONVERTED CEMR LAB
BUN: 49 mg/dL — ABNORMAL HIGH (ref 6–23)
Basophils Absolute: 0 10*3/uL (ref 0.0–0.1)
Basophils Relative: 0.1 % (ref 0.0–3.0)
CO2: 32 meq/L (ref 19–32)
Calcium: 9.6 mg/dL (ref 8.4–10.5)
Chloride: 94 meq/L — ABNORMAL LOW (ref 96–112)
Creatinine, Ser: 0.9 mg/dL (ref 0.4–1.2)
Eosinophils Absolute: 0 10*3/uL (ref 0.0–0.7)
Eosinophils Relative: 0.1 % (ref 0.0–5.0)
GFR calc non Af Amer: 79.85 mL/min (ref >60)
Glucose, Bld: 147 mg/dL — ABNORMAL HIGH (ref 70–99)
HCT: 38.9 % (ref 36.0–46.0)
Hemoglobin: 12.2 g/dL (ref 12.0–15.0)
Lymphocytes Relative: 10.4 % — ABNORMAL LOW (ref 12.0–46.0)
Lymphs Abs: 1 10*3/uL (ref 0.7–4.0)
MCHC: 31.2 g/dL (ref 30.0–36.0)
MCV: 80.6 fL (ref 78.0–100.0)
Monocytes Absolute: 0.6 10*3/uL (ref 0.1–1.0)
Monocytes Relative: 5.9 % (ref 3.0–12.0)
Neutro Abs: 8 10*3/uL — ABNORMAL HIGH (ref 1.4–7.7)
Neutrophils Relative %: 83.5 % — ABNORMAL HIGH (ref 43.0–77.0)
Platelets: 412 10*3/uL — ABNORMAL HIGH (ref 150.0–400.0)
Potassium: 3.8 meq/L (ref 3.5–5.1)
RBC: 4.83 M/uL (ref 3.87–5.11)
RDW: 13.7 % (ref 11.5–14.6)
Sodium: 141 meq/L (ref 135–145)
WBC: 9.6 10*3/uL (ref 4.5–10.5)

## 2009-06-04 ENCOUNTER — Telehealth: Payer: Self-pay | Admitting: Internal Medicine

## 2009-06-07 ENCOUNTER — Ambulatory Visit (HOSPITAL_COMMUNITY): Admission: RE | Admit: 2009-06-07 | Discharge: 2009-06-07 | Payer: Self-pay | Admitting: General Surgery

## 2009-06-10 ENCOUNTER — Encounter: Payer: Self-pay | Admitting: Internal Medicine

## 2009-06-11 ENCOUNTER — Ambulatory Visit: Payer: Self-pay | Admitting: Internal Medicine

## 2009-06-11 DIAGNOSIS — I498 Other specified cardiac arrhythmias: Secondary | ICD-10-CM | POA: Insufficient documentation

## 2009-06-12 ENCOUNTER — Ambulatory Visit: Payer: Self-pay | Admitting: Family Medicine

## 2009-06-17 ENCOUNTER — Ambulatory Visit (HOSPITAL_COMMUNITY): Admission: RE | Admit: 2009-06-17 | Discharge: 2009-06-17 | Payer: Self-pay | Admitting: General Surgery

## 2009-06-17 ENCOUNTER — Encounter (INDEPENDENT_AMBULATORY_CARE_PROVIDER_SITE_OTHER): Payer: Self-pay | Admitting: *Deleted

## 2009-06-17 ENCOUNTER — Encounter: Payer: Self-pay | Admitting: Internal Medicine

## 2009-06-19 ENCOUNTER — Ambulatory Visit: Payer: Self-pay | Admitting: Internal Medicine

## 2009-06-20 ENCOUNTER — Encounter: Payer: Self-pay | Admitting: Internal Medicine

## 2009-06-24 ENCOUNTER — Ambulatory Visit: Payer: Self-pay | Admitting: Internal Medicine

## 2009-06-24 ENCOUNTER — Telehealth: Payer: Self-pay | Admitting: Internal Medicine

## 2009-06-25 ENCOUNTER — Encounter: Payer: Self-pay | Admitting: Internal Medicine

## 2009-06-26 ENCOUNTER — Encounter (INDEPENDENT_AMBULATORY_CARE_PROVIDER_SITE_OTHER): Payer: Self-pay | Admitting: *Deleted

## 2009-06-27 ENCOUNTER — Telehealth: Payer: Self-pay | Admitting: Internal Medicine

## 2009-06-27 ENCOUNTER — Encounter: Payer: Self-pay | Admitting: Internal Medicine

## 2009-06-28 ENCOUNTER — Encounter: Payer: Self-pay | Admitting: Internal Medicine

## 2009-07-02 ENCOUNTER — Telehealth: Payer: Self-pay | Admitting: Internal Medicine

## 2009-07-02 ENCOUNTER — Encounter: Payer: Self-pay | Admitting: Internal Medicine

## 2009-07-03 ENCOUNTER — Encounter: Payer: Self-pay | Admitting: Internal Medicine

## 2009-07-03 ENCOUNTER — Telehealth: Payer: Self-pay | Admitting: Internal Medicine

## 2009-07-04 ENCOUNTER — Telehealth: Payer: Self-pay | Admitting: Internal Medicine

## 2009-07-04 ENCOUNTER — Ambulatory Visit: Payer: Self-pay | Admitting: Internal Medicine

## 2009-07-05 ENCOUNTER — Encounter: Payer: Self-pay | Admitting: Internal Medicine

## 2009-07-05 ENCOUNTER — Telehealth: Payer: Self-pay | Admitting: Internal Medicine

## 2009-07-08 ENCOUNTER — Encounter: Payer: Self-pay | Admitting: Internal Medicine

## 2009-07-09 ENCOUNTER — Ambulatory Visit: Payer: Self-pay | Admitting: Internal Medicine

## 2009-07-09 ENCOUNTER — Inpatient Hospital Stay (HOSPITAL_COMMUNITY): Admission: AD | Admit: 2009-07-09 | Discharge: 2009-07-25 | Payer: Self-pay | Admitting: Internal Medicine

## 2009-07-10 ENCOUNTER — Telehealth: Payer: Self-pay | Admitting: Internal Medicine

## 2009-07-15 ENCOUNTER — Encounter (INDEPENDENT_AMBULATORY_CARE_PROVIDER_SITE_OTHER): Payer: Self-pay | Admitting: *Deleted

## 2009-07-18 ENCOUNTER — Encounter: Payer: Self-pay | Admitting: Internal Medicine

## 2009-07-25 ENCOUNTER — Institutional Professional Consult (permissible substitution): Admission: AD | Admit: 2009-07-25 | Discharge: 2009-08-14 | Payer: Self-pay | Admitting: Internal Medicine

## 2009-07-31 ENCOUNTER — Encounter (INDEPENDENT_AMBULATORY_CARE_PROVIDER_SITE_OTHER): Payer: Self-pay | Admitting: *Deleted

## 2009-08-09 ENCOUNTER — Encounter (INDEPENDENT_AMBULATORY_CARE_PROVIDER_SITE_OTHER): Payer: Self-pay | Admitting: *Deleted

## 2009-08-11 ENCOUNTER — Encounter (INDEPENDENT_AMBULATORY_CARE_PROVIDER_SITE_OTHER): Payer: Self-pay | Admitting: *Deleted

## 2009-08-14 ENCOUNTER — Encounter: Payer: Self-pay | Admitting: Internal Medicine

## 2009-08-20 ENCOUNTER — Telehealth: Payer: Self-pay | Admitting: Internal Medicine

## 2009-08-26 ENCOUNTER — Telehealth: Payer: Self-pay | Admitting: Internal Medicine

## 2009-08-29 ENCOUNTER — Telehealth: Payer: Self-pay | Admitting: Internal Medicine

## 2009-09-03 ENCOUNTER — Ambulatory Visit: Payer: Self-pay | Admitting: Internal Medicine

## 2009-09-03 ENCOUNTER — Telehealth: Payer: Self-pay | Admitting: Internal Medicine

## 2009-09-05 ENCOUNTER — Encounter: Payer: Self-pay | Admitting: Internal Medicine

## 2009-09-06 LAB — CONVERTED CEMR LAB
BUN: 35 mg/dL — ABNORMAL HIGH (ref 6–23)
Basophils Absolute: 0.1 10*3/uL (ref 0.0–0.1)
Basophils Relative: 0.5 % (ref 0.0–3.0)
CO2: 36 meq/L — ABNORMAL HIGH (ref 19–32)
Calcium: 9.4 mg/dL (ref 8.4–10.5)
Chloride: 85 meq/L — ABNORMAL LOW (ref 96–112)
Creatinine, Ser: 0.7 mg/dL (ref 0.4–1.2)
Eosinophils Absolute: 0.1 10*3/uL (ref 0.0–0.7)
Eosinophils Relative: 1 % (ref 0.0–5.0)
GFR calc non Af Amer: 106.64 mL/min (ref >60)
Glucose, Bld: 99 mg/dL (ref 70–99)
HCT: 36.7 % (ref 36.0–46.0)
Hemoglobin: 11.9 g/dL — ABNORMAL LOW (ref 12.0–15.0)
Lymphocytes Relative: 20.5 % (ref 12.0–46.0)
Lymphs Abs: 2.1 10*3/uL (ref 0.7–4.0)
MCHC: 32.4 g/dL (ref 30.0–36.0)
MCV: 78.3 fL (ref 78.0–100.0)
Monocytes Absolute: 0.8 10*3/uL (ref 0.1–1.0)
Monocytes Relative: 7.4 % (ref 3.0–12.0)
Neutro Abs: 7.3 10*3/uL (ref 1.4–7.7)
Neutrophils Relative %: 70.6 % (ref 43.0–77.0)
Platelets: 397 10*3/uL (ref 150.0–400.0)
Potassium: 2.8 meq/L — CL (ref 3.5–5.1)
RBC: 4.68 M/uL (ref 3.87–5.11)
RDW: 14.8 % — ABNORMAL HIGH (ref 11.5–14.6)
Sodium: 135 meq/L (ref 135–145)
WBC: 10.4 10*3/uL (ref 4.5–10.5)

## 2009-09-09 ENCOUNTER — Ambulatory Visit: Payer: Self-pay | Admitting: Internal Medicine

## 2009-09-10 ENCOUNTER — Encounter: Payer: Self-pay | Admitting: Internal Medicine
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
## 2009-09-24 ENCOUNTER — Telehealth: Payer: Self-pay | Admitting: Internal Medicine

## 2009-09-25 ENCOUNTER — Ambulatory Visit: Payer: Self-pay | Admitting: Physician Assistant

## 2009-09-25 ENCOUNTER — Inpatient Hospital Stay (HOSPITAL_COMMUNITY): Admission: AD | Admit: 2009-09-25 | Discharge: 2009-10-10 | Payer: Self-pay | Admitting: Internal Medicine

## 2009-09-25 ENCOUNTER — Ambulatory Visit: Payer: Self-pay | Admitting: Internal Medicine

## 2009-09-25 DIAGNOSIS — R634 Abnormal weight loss: Secondary | ICD-10-CM | POA: Insufficient documentation

## 2009-09-27 ENCOUNTER — Encounter: Payer: Self-pay | Admitting: Internal Medicine
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
## 2009-10-11 ENCOUNTER — Telehealth: Payer: Self-pay | Admitting: Internal Medicine

## 2009-10-14 ENCOUNTER — Telehealth: Payer: Self-pay | Admitting: Internal Medicine

## 2009-10-14 ENCOUNTER — Encounter: Payer: Self-pay | Admitting: Internal Medicine

## 2009-10-15 ENCOUNTER — Telehealth: Payer: Self-pay | Admitting: Internal Medicine

## 2009-10-16 ENCOUNTER — Emergency Department (HOSPITAL_COMMUNITY): Admission: EM | Admit: 2009-10-16 | Discharge: 2009-10-16 | Payer: Self-pay | Admitting: Emergency Medicine

## 2009-10-16 ENCOUNTER — Ambulatory Visit: Payer: Self-pay | Admitting: Internal Medicine

## 2009-10-16 ENCOUNTER — Encounter (INDEPENDENT_AMBULATORY_CARE_PROVIDER_SITE_OTHER): Payer: Self-pay | Admitting: *Deleted

## 2009-10-17 ENCOUNTER — Ambulatory Visit (HOSPITAL_COMMUNITY): Admission: RE | Admit: 2009-10-17 | Discharge: 2009-10-17 | Payer: Self-pay | Admitting: Internal Medicine

## 2009-10-17 ENCOUNTER — Telehealth: Payer: Self-pay | Admitting: Internal Medicine

## 2009-10-18 ENCOUNTER — Telehealth: Payer: Self-pay | Admitting: Internal Medicine

## 2009-10-21 ENCOUNTER — Encounter: Payer: Self-pay | Admitting: Internal Medicine

## 2009-10-28 ENCOUNTER — Telehealth: Payer: Self-pay | Admitting: Internal Medicine

## 2009-10-28 ENCOUNTER — Emergency Department (HOSPITAL_COMMUNITY): Admission: EM | Admit: 2009-10-28 | Discharge: 2009-10-29 | Payer: Self-pay | Admitting: Emergency Medicine

## 2009-10-28 ENCOUNTER — Encounter: Payer: Self-pay | Admitting: Internal Medicine

## 2009-10-29 ENCOUNTER — Telehealth: Payer: Self-pay | Admitting: Internal Medicine

## 2009-10-29 ENCOUNTER — Encounter (INDEPENDENT_AMBULATORY_CARE_PROVIDER_SITE_OTHER): Payer: Self-pay | Admitting: *Deleted

## 2009-10-30 ENCOUNTER — Ambulatory Visit: Payer: Self-pay | Admitting: Internal Medicine

## 2009-10-30 ENCOUNTER — Inpatient Hospital Stay (HOSPITAL_COMMUNITY): Admission: EM | Admit: 2009-10-30 | Discharge: 2009-11-05 | Payer: Self-pay | Admitting: Emergency Medicine

## 2009-10-30 ENCOUNTER — Telehealth: Payer: Self-pay | Admitting: Internal Medicine

## 2009-10-30 ENCOUNTER — Encounter (INDEPENDENT_AMBULATORY_CARE_PROVIDER_SITE_OTHER): Payer: Self-pay | Admitting: *Deleted

## 2009-10-31 ENCOUNTER — Encounter: Payer: Self-pay | Admitting: Internal Medicine

## 2009-11-02 ENCOUNTER — Encounter: Payer: Self-pay | Admitting: Internal Medicine

## 2009-11-06 ENCOUNTER — Encounter: Payer: Self-pay | Admitting: Internal Medicine

## 2009-11-13 ENCOUNTER — Encounter: Payer: Self-pay | Admitting: Internal Medicine

## 2009-11-13 ENCOUNTER — Telehealth: Payer: Self-pay | Admitting: Internal Medicine

## 2009-11-20 ENCOUNTER — Encounter: Payer: Self-pay | Admitting: Internal Medicine

## 2009-11-21 ENCOUNTER — Ambulatory Visit: Payer: Self-pay | Admitting: Internal Medicine

## 2009-11-28 ENCOUNTER — Telehealth: Payer: Self-pay | Admitting: Internal Medicine

## 2009-12-04 ENCOUNTER — Encounter: Payer: Self-pay | Admitting: Internal Medicine

## 2009-12-09 ENCOUNTER — Encounter: Payer: Self-pay | Admitting: Internal Medicine

## 2009-12-11 ENCOUNTER — Telehealth: Payer: Self-pay | Admitting: Internal Medicine

## 2009-12-11 ENCOUNTER — Encounter: Payer: Self-pay | Admitting: Internal Medicine

## 2009-12-17 ENCOUNTER — Encounter: Payer: Self-pay | Admitting: Internal Medicine

## 2009-12-18 ENCOUNTER — Encounter: Payer: Self-pay | Admitting: Internal Medicine

## 2009-12-19 ENCOUNTER — Inpatient Hospital Stay (HOSPITAL_COMMUNITY): Admission: EM | Admit: 2009-12-19 | Discharge: 2009-12-24 | Payer: Self-pay | Admitting: Emergency Medicine

## 2009-12-19 ENCOUNTER — Encounter (INDEPENDENT_AMBULATORY_CARE_PROVIDER_SITE_OTHER): Payer: Self-pay | Admitting: *Deleted

## 2009-12-19 ENCOUNTER — Ambulatory Visit: Payer: Self-pay | Admitting: Gastroenterology

## 2009-12-20 ENCOUNTER — Encounter: Payer: Self-pay | Admitting: Internal Medicine

## 2009-12-20 ENCOUNTER — Encounter (INDEPENDENT_AMBULATORY_CARE_PROVIDER_SITE_OTHER): Payer: Self-pay | Admitting: *Deleted

## 2009-12-23 ENCOUNTER — Encounter (INDEPENDENT_AMBULATORY_CARE_PROVIDER_SITE_OTHER): Payer: Self-pay | Admitting: *Deleted

## 2009-12-26 ENCOUNTER — Encounter: Payer: Self-pay | Admitting: Internal Medicine

## 2010-01-01 ENCOUNTER — Encounter: Admission: RE | Admit: 2010-01-01 | Discharge: 2010-01-01 | Payer: Self-pay | Admitting: Family Medicine

## 2010-01-01 ENCOUNTER — Telehealth: Payer: Self-pay | Admitting: Internal Medicine

## 2010-01-01 ENCOUNTER — Encounter: Payer: Self-pay | Admitting: Internal Medicine

## 2010-01-03 ENCOUNTER — Telehealth: Payer: Self-pay | Admitting: Internal Medicine

## 2010-01-07 ENCOUNTER — Ambulatory Visit: Payer: Self-pay | Admitting: Internal Medicine

## 2010-01-09 ENCOUNTER — Encounter: Payer: Self-pay | Admitting: Internal Medicine

## 2010-01-10 ENCOUNTER — Ambulatory Visit: Payer: Self-pay | Admitting: Internal Medicine

## 2010-01-10 ENCOUNTER — Ambulatory Visit (HOSPITAL_COMMUNITY): Admission: RE | Admit: 2010-01-10 | Discharge: 2010-01-10 | Payer: Self-pay | Admitting: Internal Medicine

## 2010-01-15 ENCOUNTER — Encounter: Payer: Self-pay | Admitting: Internal Medicine

## 2010-01-18 ENCOUNTER — Encounter: Payer: Self-pay | Admitting: Internal Medicine

## 2010-01-20 ENCOUNTER — Telehealth: Payer: Self-pay | Admitting: Internal Medicine

## 2010-01-29 ENCOUNTER — Encounter: Payer: Self-pay | Admitting: Internal Medicine

## 2010-01-30 ENCOUNTER — Telehealth: Payer: Self-pay | Admitting: Internal Medicine

## 2010-02-04 IMAGING — MR MR [PERSON_NAME] UP JT W/O CM*R*
4 of 8 series · 19 of 40 positions shown · non-contrast
Comparison: Radiographs 06/03/2007

CLINICAL DATA: Right shoulder pain

MRI RIGHT SHOULDER WITHOUT CONTRAST
TECHNIQUE: Multiplanar, multisequence MR imaging of the right
shoulder was performed.  No intravenous contrast was administered.

[Series 3: T2 fat-sat · axial · 4.0mm · 0.23mm/px · z∈[-81,+14]mm · 6 of 20 slices shown (1 of 3)]
[im 1/20]
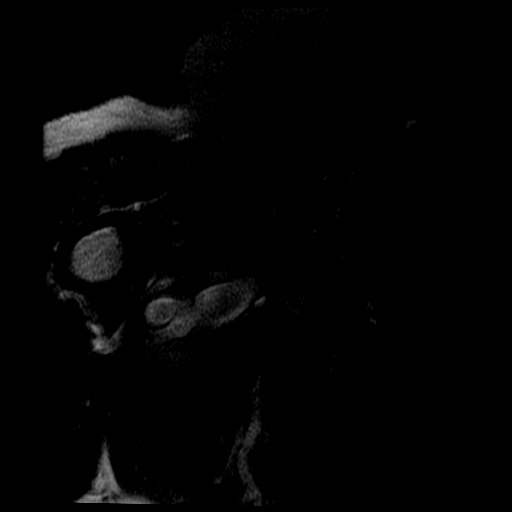
[im 4/20]
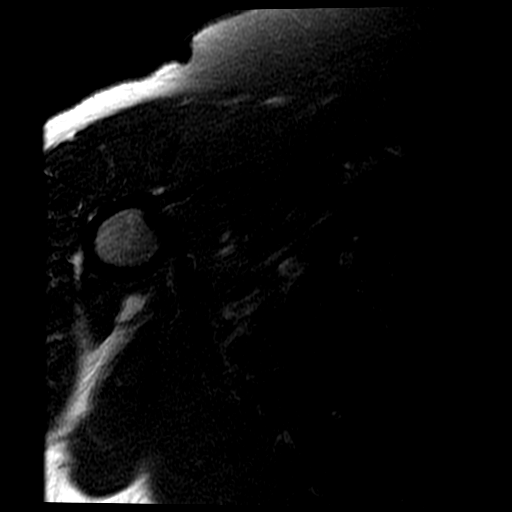
[im 8/20]
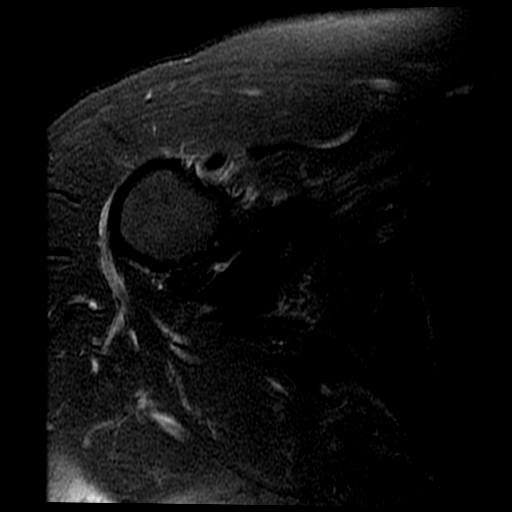
[im 12/20]
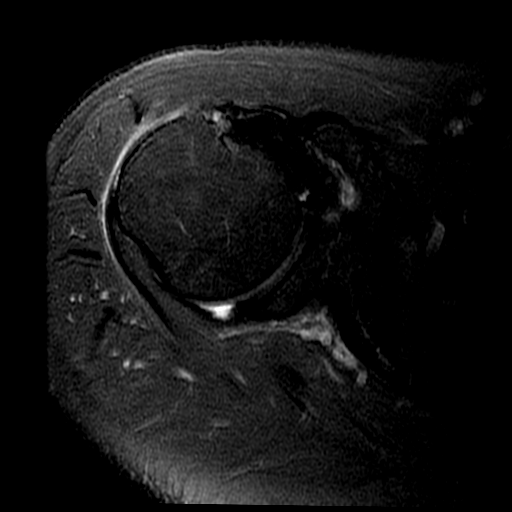
[im 16/20]
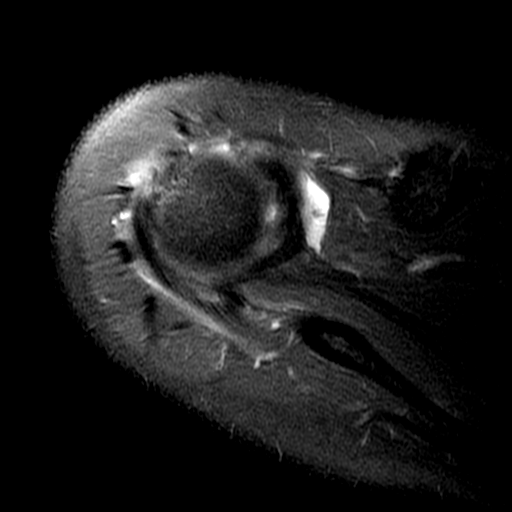
[im 20/20]
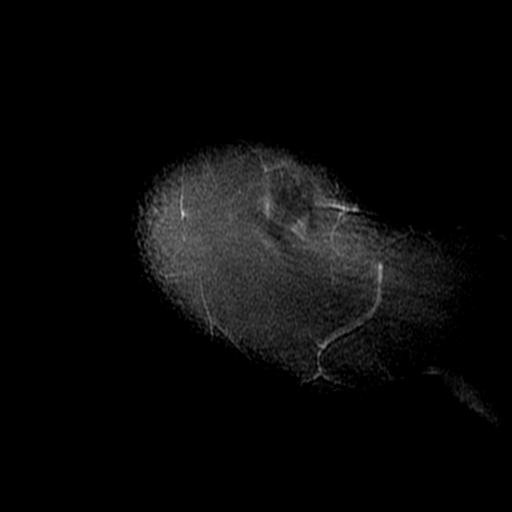

[Series 4: T2 fat-sat · sagittal · 4.0mm · 0.27mm/px · 5 of 18 slices shown (2 of 3)]
[im 1/18]
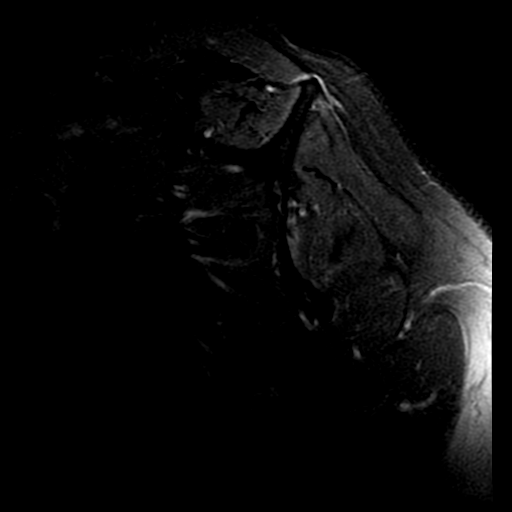
[im 5/18]
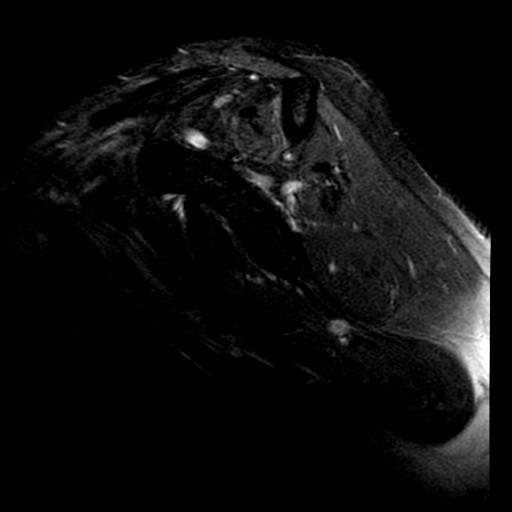
[im 9/18]
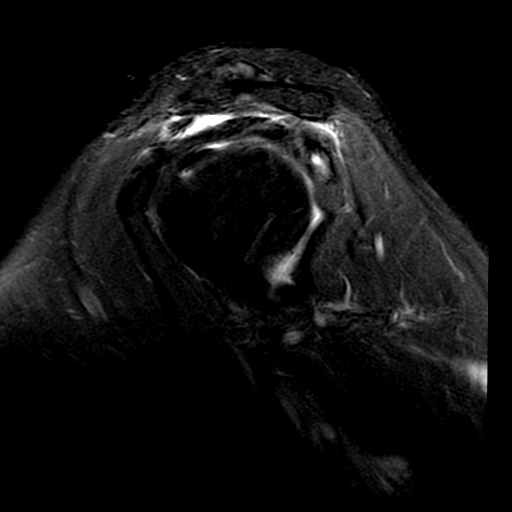
[im 13/18]
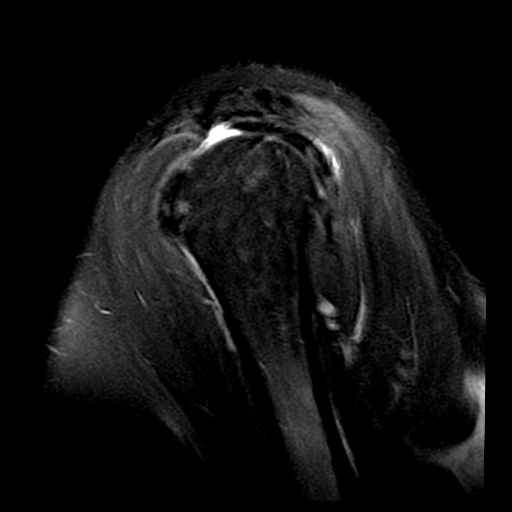
[im 18/18]
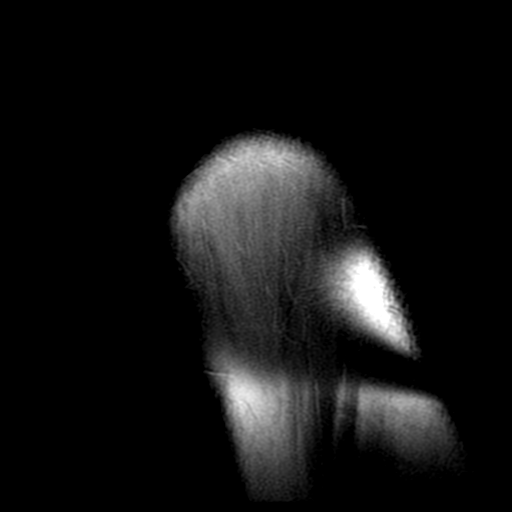

[Series 6: T2 fat-sat · oblique · 4.0mm · 0.27mm/px · 3 of 18 slices shown (3 of 3)]
[im 1/18]
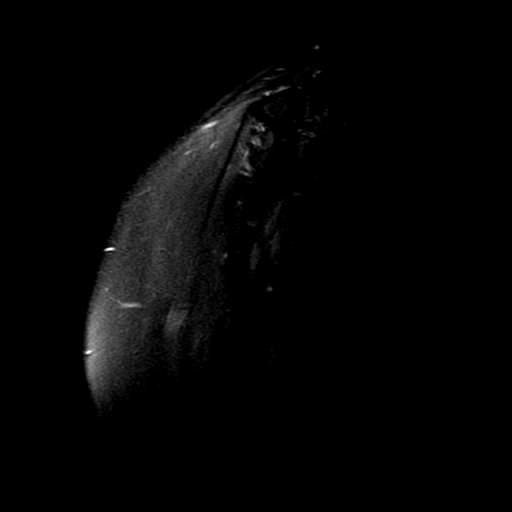
[im 9/18]
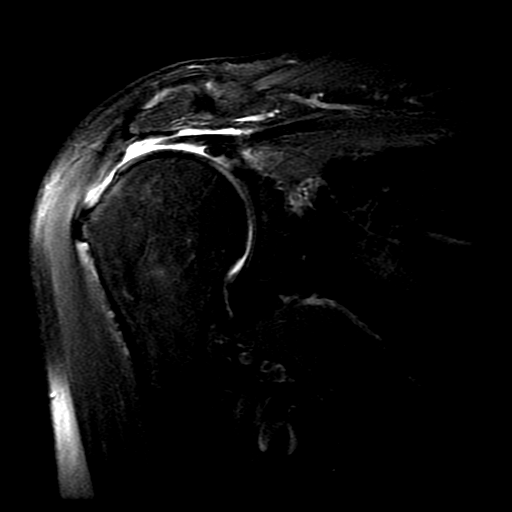
[im 18/18]
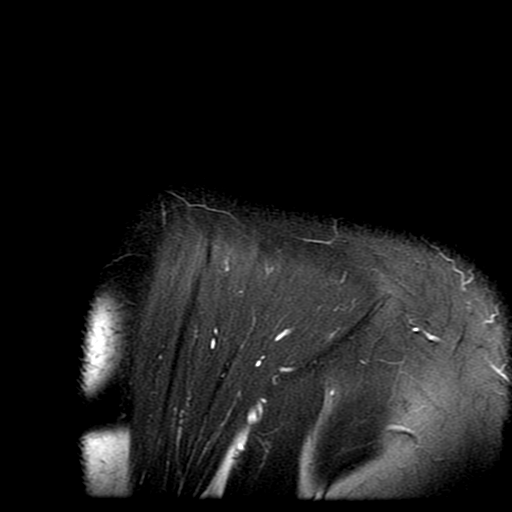

[Series 7: PD fat-sat · oblique · 4.0mm · 0.27mm/px · 5 of 18 slices shown]
[im 1/18]
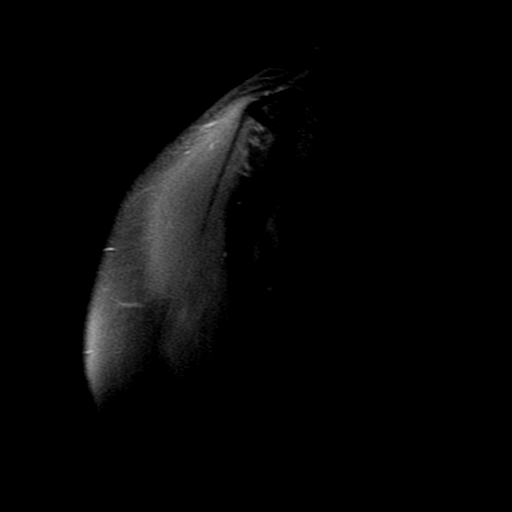
[im 5/18]
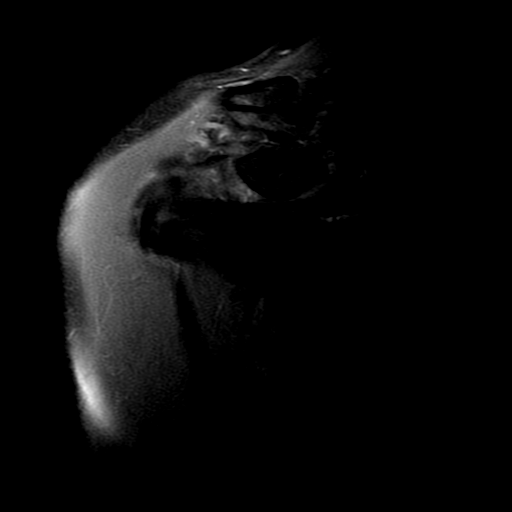
[im 9/18]
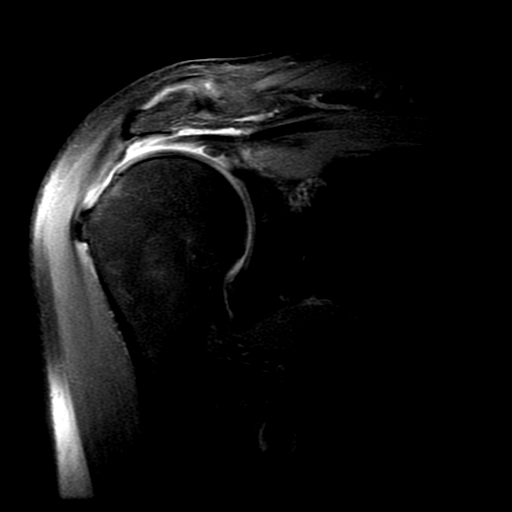
[im 13/18]
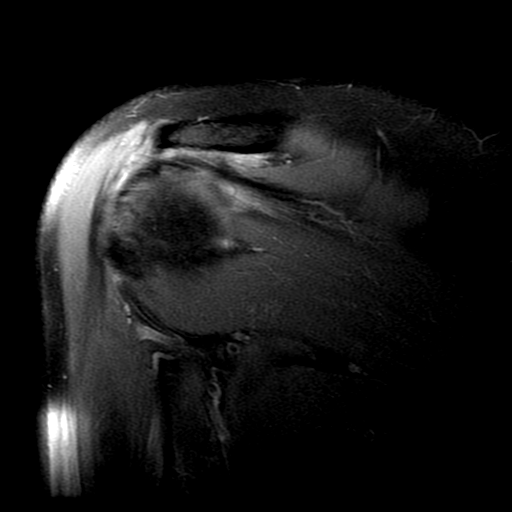
[im 18/18]
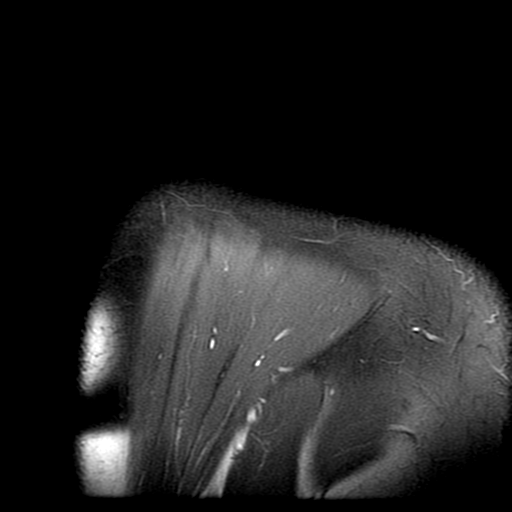

[19 of 40 positions shown; findings below may reference images not displayed]

FINDINGS: A full-thickness tear of the anterior leading into the
supraspinatus is identified.  Tendon appears retracted 1 cm.  Mild
muscular atrophy.  Tendinopathy involves the infraspinatus without
full-thickness tear discontinuity.  Anteriorly the subscapularis
and biceps tendons are intact.

The anterior and posterior labrum are intact.  The superior labral
biceps anchor complex is intact.  The patient has a type 2 concave
undersurface shaped acromion.  Mild lateral downsloping with mild
AC joint degenerative changes.  Subacromial subdeltoid bursitis.
IMPRESSION: Full-thickness tear of the distal supraspinatus with retraction of
the tendon approximately 1 cm.  Subacromial subdeltoid bursitis.
AC joint degenerative change with lateral downsloping of the
acromion.

## 2010-02-05 ENCOUNTER — Telehealth: Payer: Self-pay | Admitting: Internal Medicine

## 2010-02-09 ENCOUNTER — Emergency Department (HOSPITAL_COMMUNITY): Admission: EM | Admit: 2010-02-09 | Discharge: 2010-02-09 | Payer: Self-pay | Admitting: Emergency Medicine

## 2010-02-11 ENCOUNTER — Encounter: Payer: Self-pay | Admitting: Internal Medicine

## 2010-02-12 ENCOUNTER — Encounter: Payer: Self-pay | Admitting: Internal Medicine

## 2010-02-18 ENCOUNTER — Encounter: Payer: Self-pay | Admitting: Internal Medicine

## 2010-02-18 ENCOUNTER — Ambulatory Visit: Payer: Self-pay | Admitting: Internal Medicine

## 2010-02-18 ENCOUNTER — Inpatient Hospital Stay (HOSPITAL_COMMUNITY): Admission: EM | Admit: 2010-02-18 | Discharge: 2010-02-26 | Payer: Self-pay | Admitting: Emergency Medicine

## 2010-03-01 ENCOUNTER — Emergency Department (HOSPITAL_COMMUNITY): Admission: EM | Admit: 2010-03-01 | Discharge: 2010-03-01 | Payer: Self-pay | Admitting: Emergency Medicine

## 2010-03-01 ENCOUNTER — Encounter (INDEPENDENT_AMBULATORY_CARE_PROVIDER_SITE_OTHER): Payer: Self-pay | Admitting: *Deleted

## 2010-03-03 ENCOUNTER — Telehealth: Payer: Self-pay | Admitting: Internal Medicine

## 2010-03-11 ENCOUNTER — Encounter: Payer: Self-pay | Admitting: Internal Medicine

## 2010-03-11 ENCOUNTER — Ambulatory Visit: Payer: Self-pay | Admitting: Internal Medicine

## 2010-03-11 DIAGNOSIS — E876 Hypokalemia: Secondary | ICD-10-CM | POA: Insufficient documentation

## 2010-03-13 ENCOUNTER — Telehealth: Payer: Self-pay | Admitting: Internal Medicine

## 2010-03-14 ENCOUNTER — Encounter: Payer: Self-pay | Admitting: Internal Medicine

## 2010-03-23 IMAGING — CR DG CHEST 2V
2 series · 2 of 2 positions shown · non-contrast
Comparison: 05/02/2008

CLINICAL DATA: Hiatal hernia with history of pleural effusion,
shortness of breath and chest pain.

CHEST - 2 VIEW

[w chest pa]
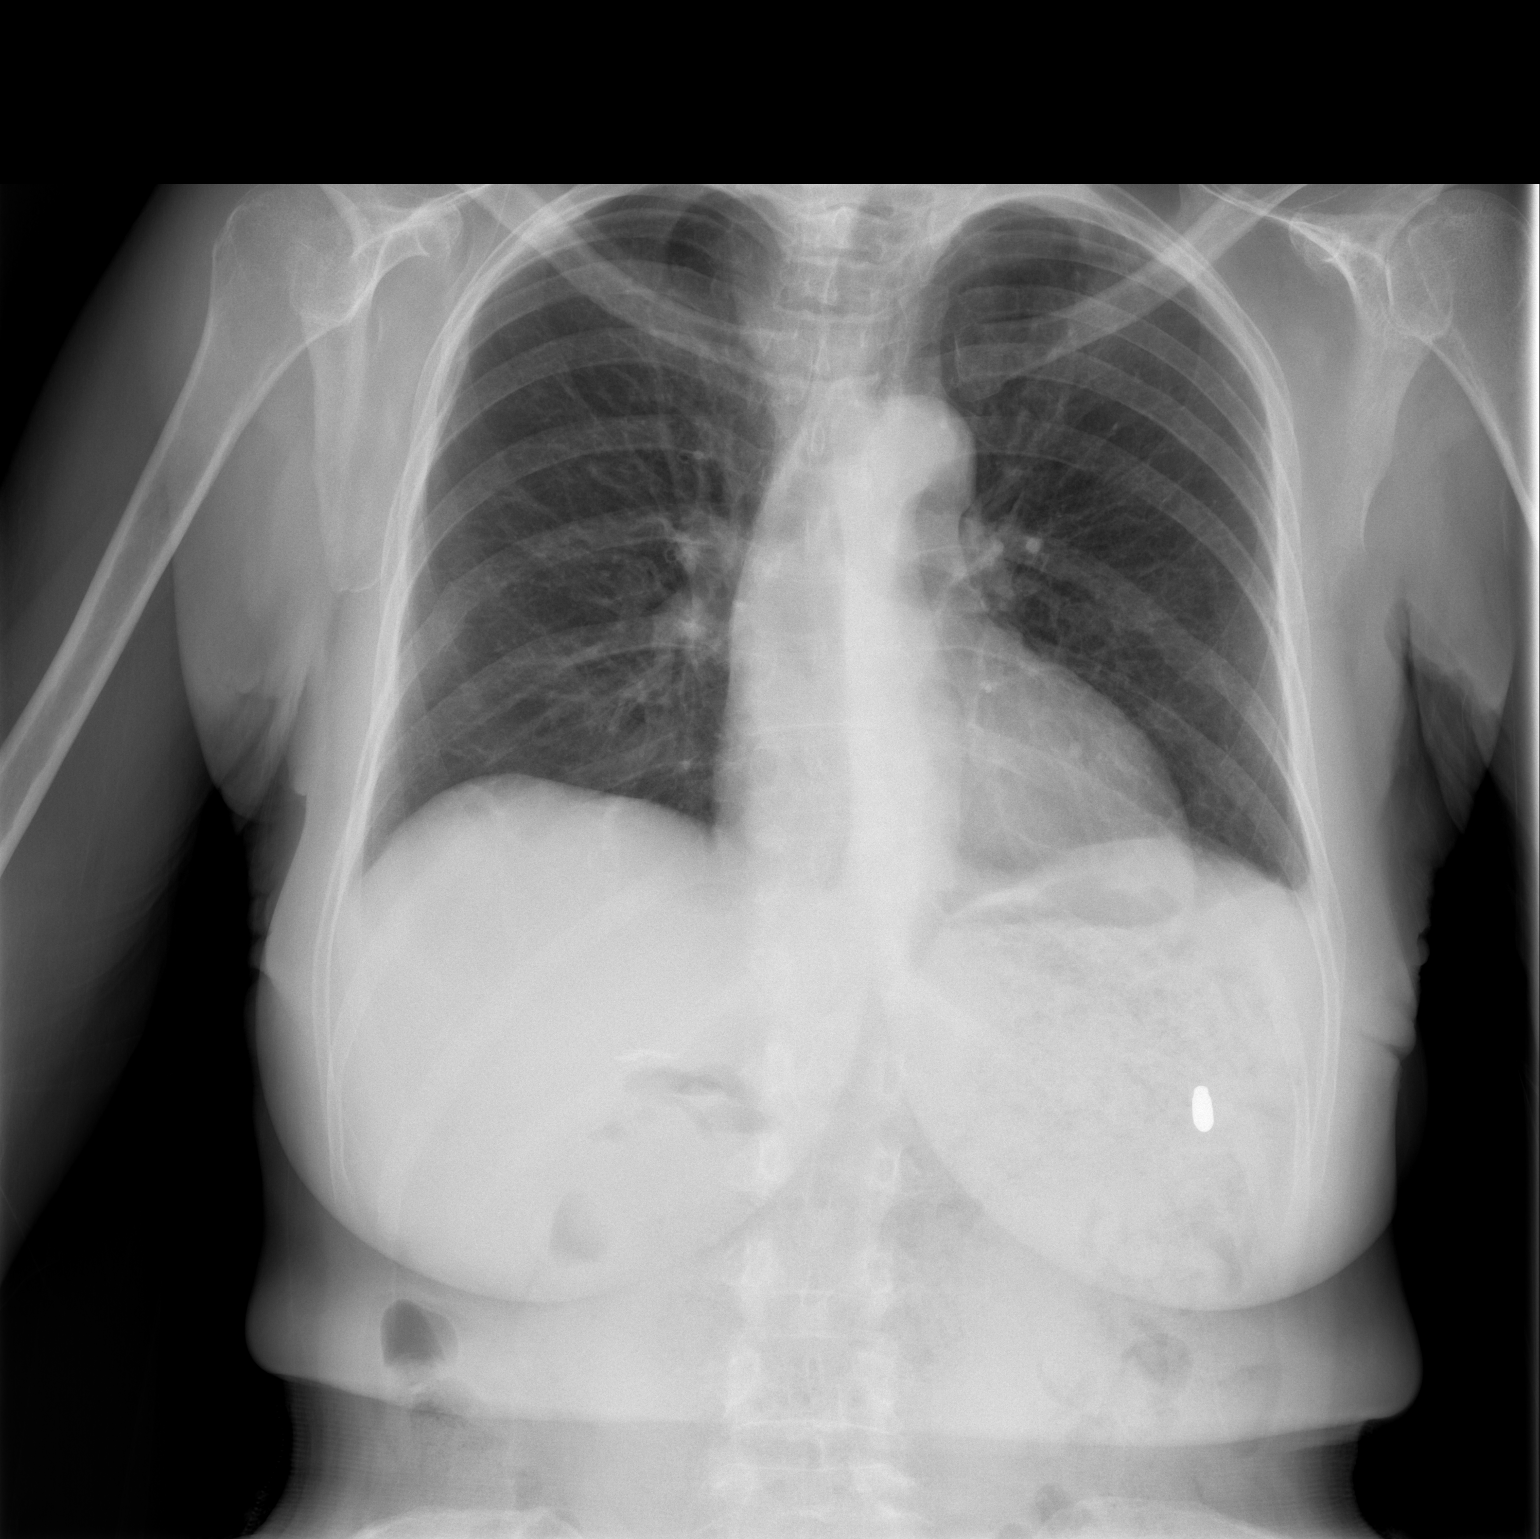

[w chest lat]
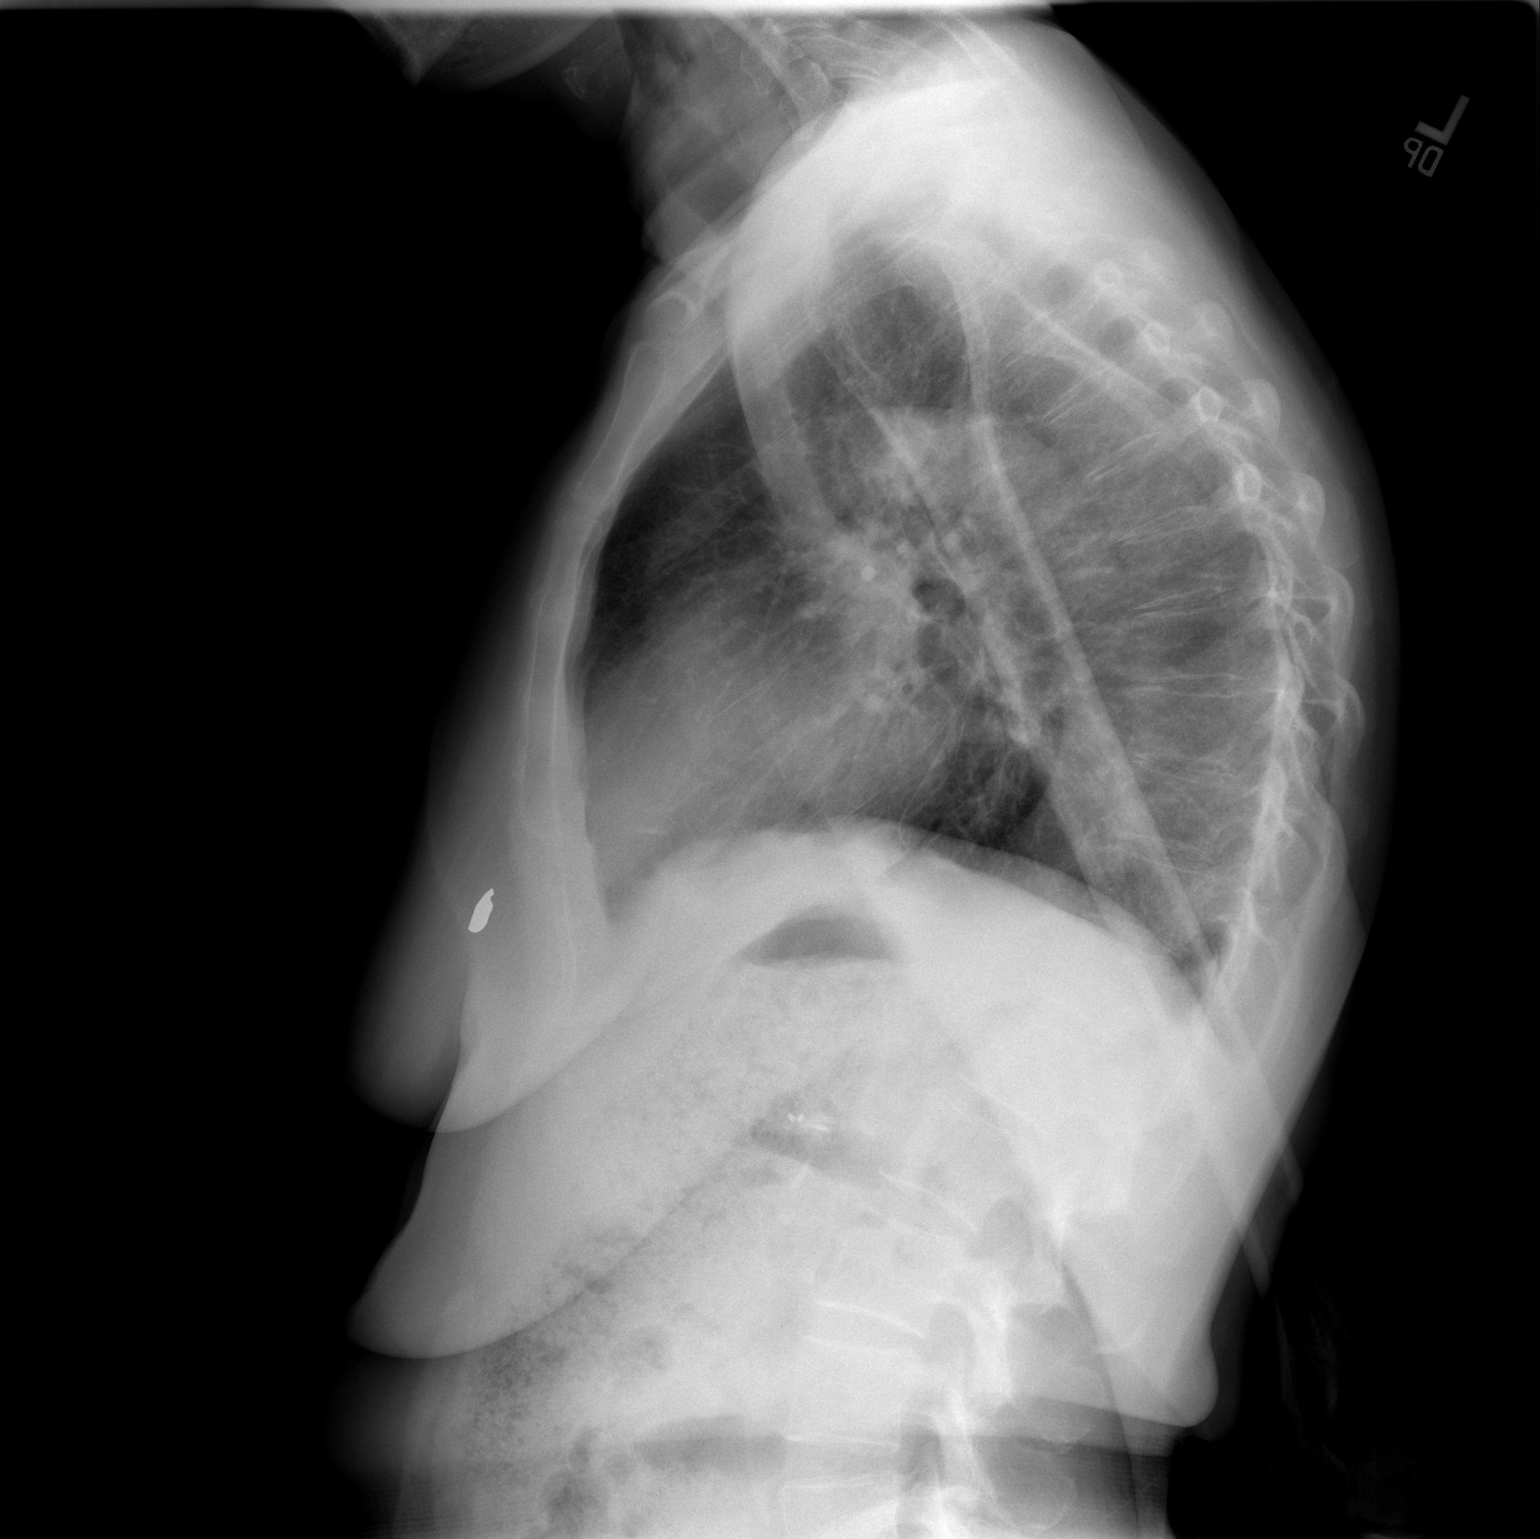

[2 of 2 positions shown; findings below may reference images not displayed]

FINDINGS: Trachea is midline.  Heart size normal.  Lungs are clear.
There is slight blunting of the left costophrenic angle, which may
be chronic.  There is a metallic density in the left breast. Old
right rib fractures.
IMPRESSION: No acute findings.

## 2010-03-24 ENCOUNTER — Encounter: Payer: Self-pay | Admitting: Internal Medicine

## 2010-03-25 ENCOUNTER — Telehealth: Payer: Self-pay | Admitting: Internal Medicine

## 2010-03-25 DIAGNOSIS — K9420 Gastrostomy complication, unspecified: Secondary | ICD-10-CM | POA: Insufficient documentation

## 2010-03-31 ENCOUNTER — Ambulatory Visit (HOSPITAL_COMMUNITY)
Admission: RE | Admit: 2010-03-31 | Discharge: 2010-03-31 | Payer: Self-pay | Source: Home / Self Care | Admitting: Internal Medicine

## 2010-04-01 ENCOUNTER — Ambulatory Visit: Payer: Self-pay | Admitting: Internal Medicine

## 2010-04-01 LAB — CONVERTED CEMR LAB
ALT: 31 units/L (ref 0–35)
AST: 25 units/L (ref 0–37)
Albumin: 2.8 g/dL — ABNORMAL LOW (ref 3.5–5.2)
Alkaline Phosphatase: 89 units/L (ref 39–117)
BUN: 15 mg/dL (ref 6–23)
Basophils Absolute: 0 10*3/uL (ref 0.0–0.1)
Basophils Relative: 0.3 % (ref 0.0–3.0)
CO2: 25 meq/L (ref 19–32)
Calcium: 8.7 mg/dL (ref 8.4–10.5)
Chloride: 100 meq/L (ref 96–112)
Creatinine, Ser: 0.5 mg/dL (ref 0.4–1.2)
Eosinophils Absolute: 0.6 10*3/uL (ref 0.0–0.7)
Eosinophils Relative: 4.9 % (ref 0.0–5.0)
GFR calc non Af Amer: 150.02 mL/min (ref >60)
Glucose, Bld: 91 mg/dL (ref 70–99)
HCT: 39.9 % (ref 36.0–46.0)
Hemoglobin: 12.7 g/dL (ref 12.0–15.0)
Iron: 125 ug/dL (ref 42–145)
Lymphocytes Relative: 20.1 % (ref 12.0–46.0)
Lymphs Abs: 2.4 10*3/uL (ref 0.7–4.0)
MCHC: 31.8 g/dL (ref 30.0–36.0)
MCV: 79 fL (ref 78.0–100.0)
Monocytes Absolute: 0.7 10*3/uL (ref 0.1–1.0)
Monocytes Relative: 5.9 % (ref 3.0–12.0)
Neutro Abs: 8 10*3/uL — ABNORMAL HIGH (ref 1.4–7.7)
Neutrophils Relative %: 68.8 % (ref 43.0–77.0)
Platelets: 421 10*3/uL — ABNORMAL HIGH (ref 150.0–400.0)
Potassium: 3.5 meq/L (ref 3.5–5.1)
RBC: 5.06 M/uL (ref 3.87–5.11)
RDW: 13.9 % (ref 11.5–14.6)
Saturation Ratios: 47.1 % (ref 20.0–50.0)
Sodium: 137 meq/L (ref 135–145)
Total Bilirubin: 0.3 mg/dL (ref 0.3–1.2)
Total Protein: 5.6 g/dL — ABNORMAL LOW (ref 6.0–8.3)
Transferrin: 189.7 mg/dL — ABNORMAL LOW (ref 212.0–360.0)
WBC: 11.7 10*3/uL — ABNORMAL HIGH (ref 4.5–10.5)

## 2010-04-03 ENCOUNTER — Inpatient Hospital Stay (HOSPITAL_COMMUNITY)
Admission: EM | Admit: 2010-04-03 | Discharge: 2010-04-15 | Payer: Self-pay | Source: Home / Self Care | Attending: Internal Medicine | Admitting: Internal Medicine

## 2010-04-03 ENCOUNTER — Encounter (INDEPENDENT_AMBULATORY_CARE_PROVIDER_SITE_OTHER): Payer: Self-pay | Admitting: *Deleted

## 2010-04-05 ENCOUNTER — Encounter (INDEPENDENT_AMBULATORY_CARE_PROVIDER_SITE_OTHER): Payer: Self-pay | Admitting: *Deleted

## 2010-04-10 ENCOUNTER — Encounter (INDEPENDENT_AMBULATORY_CARE_PROVIDER_SITE_OTHER): Payer: Self-pay | Admitting: *Deleted

## 2010-04-15 ENCOUNTER — Encounter (INDEPENDENT_AMBULATORY_CARE_PROVIDER_SITE_OTHER): Payer: Self-pay | Admitting: *Deleted

## 2010-04-16 ENCOUNTER — Telehealth: Payer: Self-pay | Admitting: Internal Medicine

## 2010-04-17 ENCOUNTER — Ambulatory Visit: Payer: Self-pay | Admitting: Internal Medicine

## 2010-04-17 ENCOUNTER — Telehealth: Payer: Self-pay | Admitting: Internal Medicine

## 2010-04-22 ENCOUNTER — Encounter (INDEPENDENT_AMBULATORY_CARE_PROVIDER_SITE_OTHER): Payer: Self-pay | Admitting: *Deleted

## 2010-04-22 ENCOUNTER — Telehealth: Payer: Self-pay | Admitting: Internal Medicine

## 2010-04-22 ENCOUNTER — Inpatient Hospital Stay (HOSPITAL_COMMUNITY)
Admission: EM | Admit: 2010-04-22 | Discharge: 2010-04-27 | Payer: Self-pay | Source: Home / Self Care | Attending: Internal Medicine | Admitting: Internal Medicine

## 2010-04-23 ENCOUNTER — Encounter (INDEPENDENT_AMBULATORY_CARE_PROVIDER_SITE_OTHER): Payer: Self-pay | Admitting: *Deleted

## 2010-05-02 IMAGING — MG MM SCREEN MAMMOGRAM BILATERAL
4 series · 4 of 4 positions shown · non-contrast
Comparison: Prior studies.

DG SCREEN MAMMOGRAM BILATERAL
Bilateral CC and MLO view(s) were taken.

DIGITAL SCREENING MAMMOGRAM WITH CAD:

[R CC]
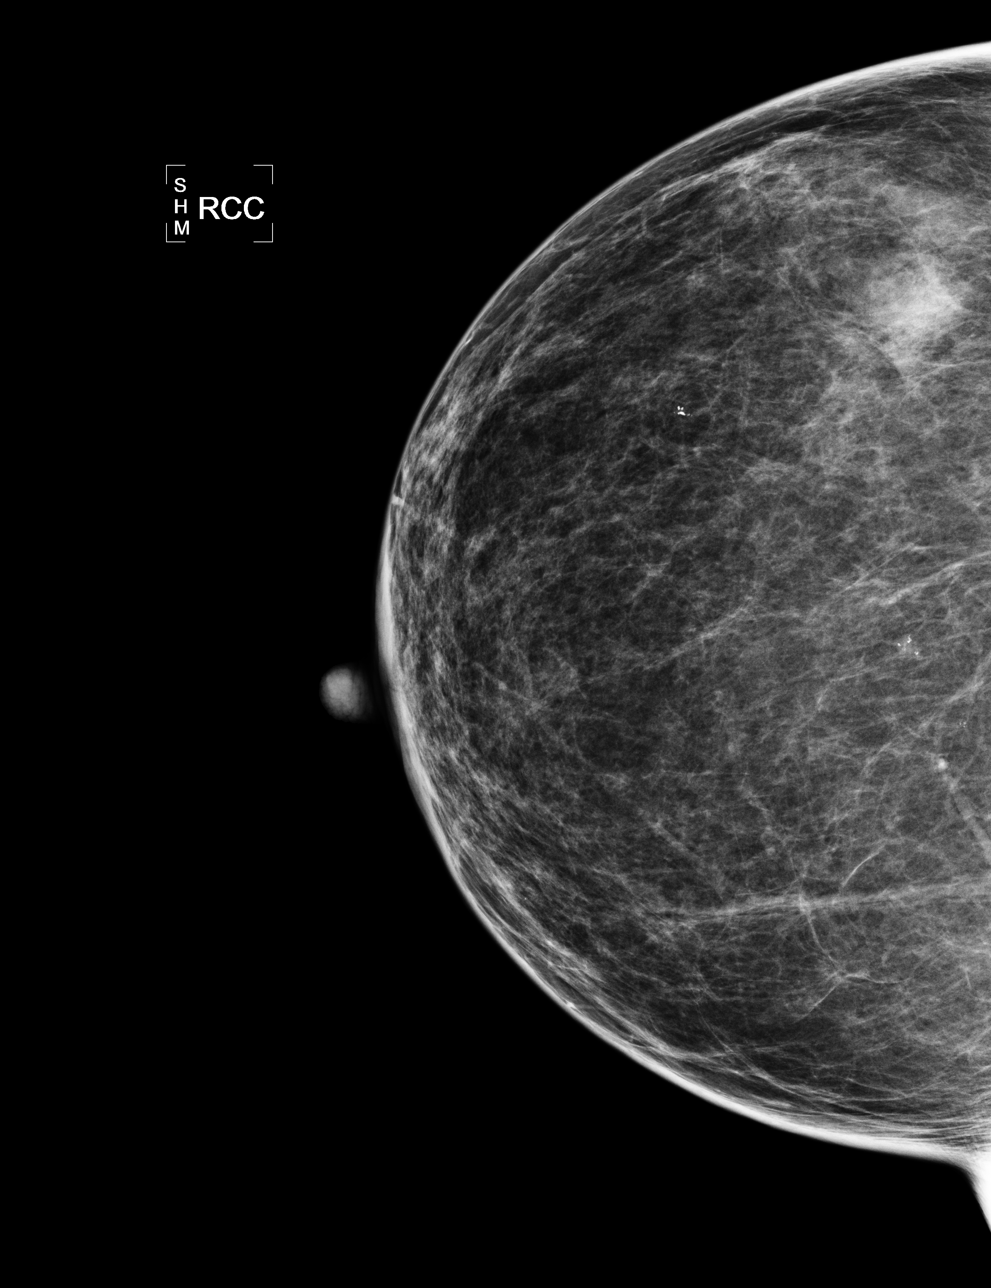

[L CC]
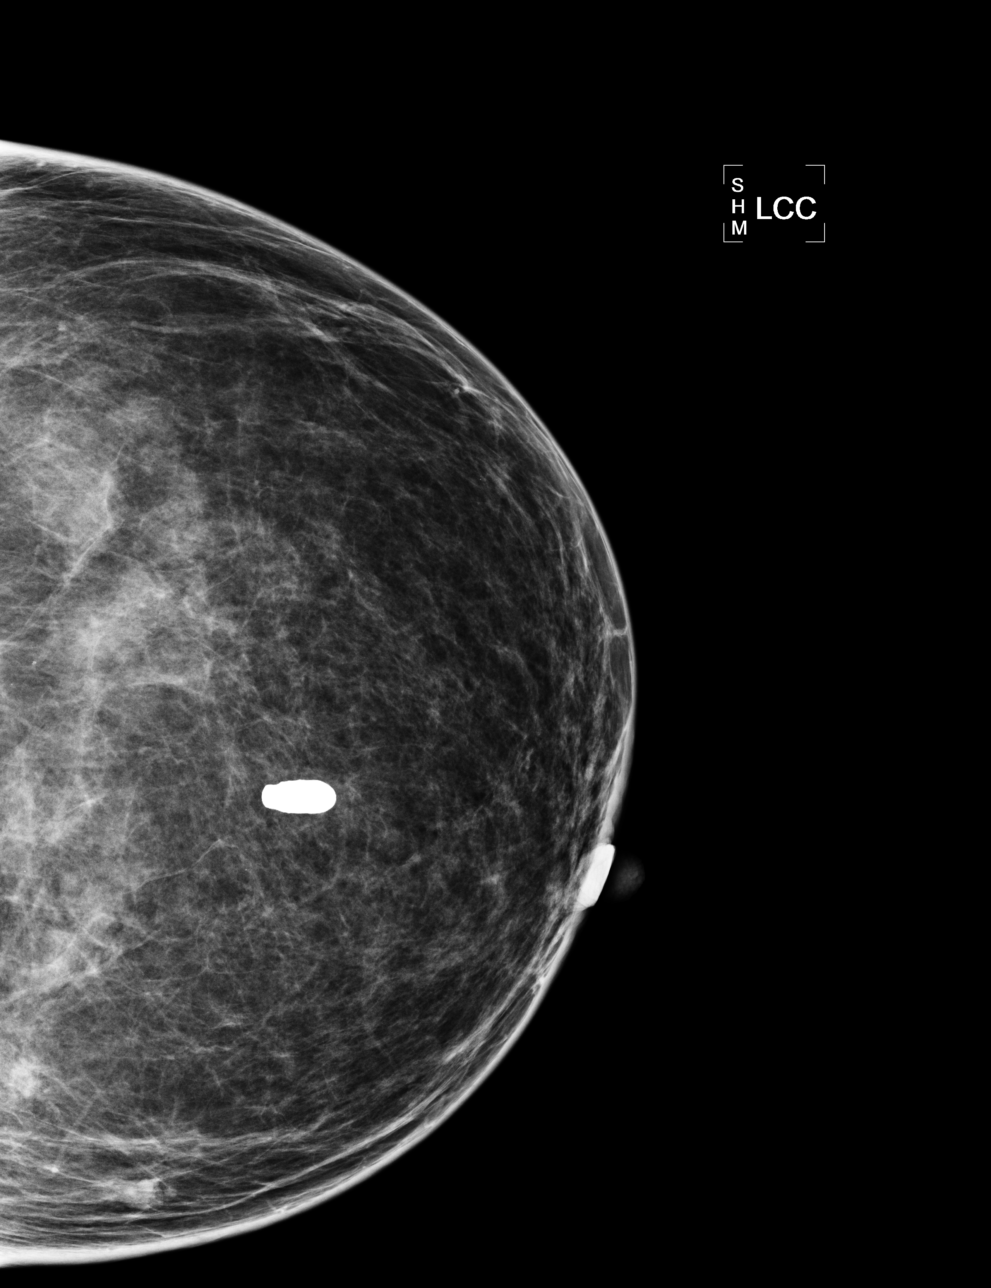

[L MLO]
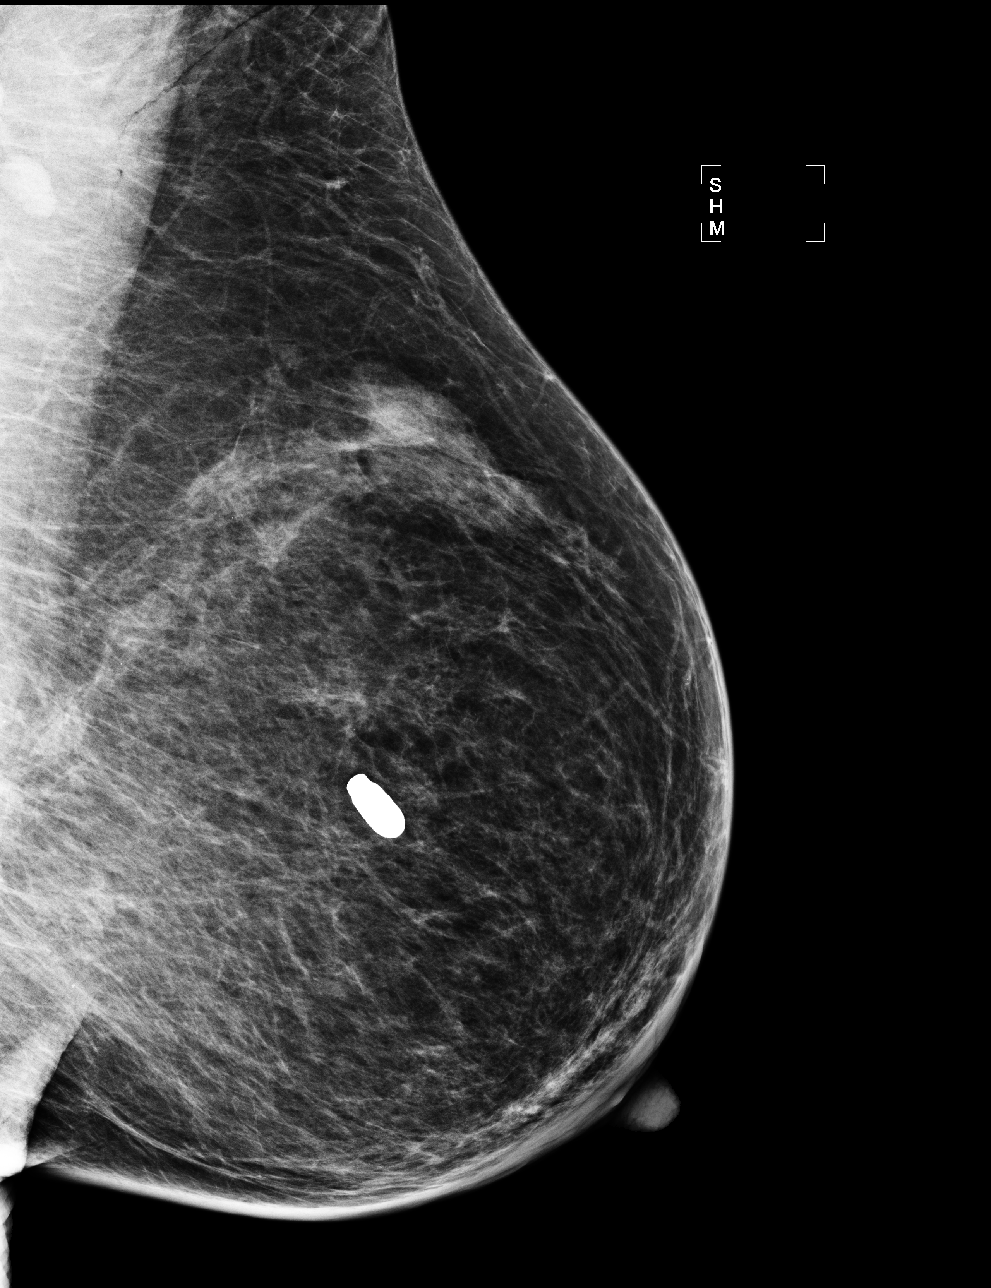

[R MLO]
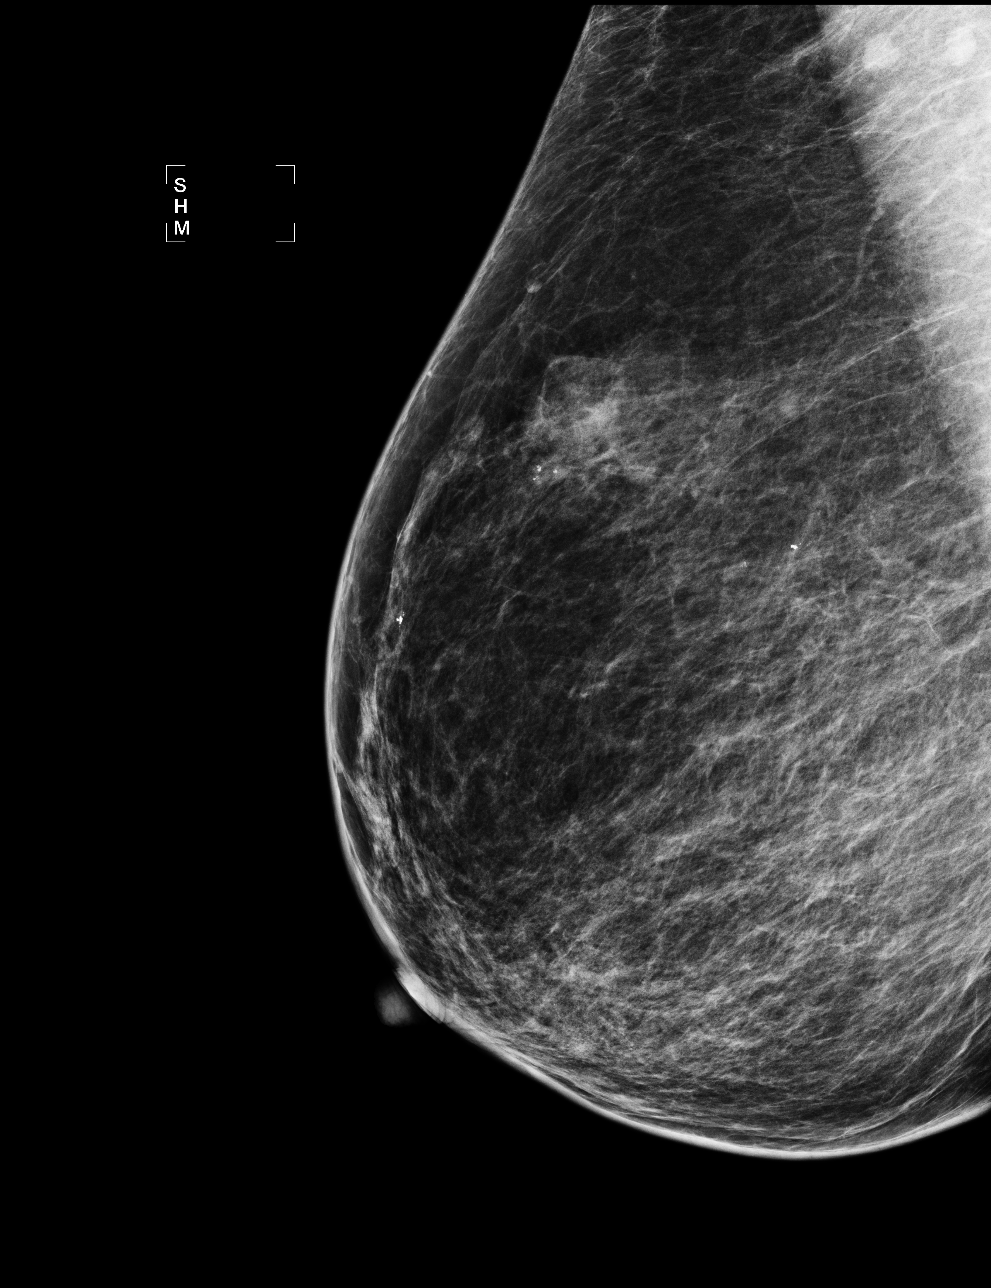

[4 of 4 positions shown; findings below may reference images not displayed]

There are scattered fibroglandular densities.  There is no dominant mass, architectural distortion 
or calcification to suggest malignancy.

Images were processed with CAD.
IMPRESSION: No mammographic evidence of malignancy.  Suggest yearly screening mammography.

A result letter of this screening mammogram will be mailed directly to the patient.

ASSESSMENT: Negative - BI-RADS 1

Screening mammogram in 1 year.
,

## 2010-05-04 HISTORY — PX: TOTAL GASTRECTOMY: SHX2540

## 2010-05-06 ENCOUNTER — Encounter: Payer: Self-pay | Admitting: Internal Medicine

## 2010-05-07 ENCOUNTER — Encounter: Payer: Self-pay | Admitting: Internal Medicine

## 2010-05-09 ENCOUNTER — Encounter: Payer: Self-pay | Admitting: Internal Medicine

## 2010-05-10 ENCOUNTER — Encounter: Payer: Self-pay | Admitting: Internal Medicine

## 2010-05-12 ENCOUNTER — Telehealth: Payer: Self-pay | Admitting: Internal Medicine

## 2010-05-13 ENCOUNTER — Encounter: Payer: Self-pay | Admitting: Internal Medicine

## 2010-05-13 ENCOUNTER — Telehealth: Payer: Self-pay | Admitting: Internal Medicine

## 2010-05-14 ENCOUNTER — Telehealth (INDEPENDENT_AMBULATORY_CARE_PROVIDER_SITE_OTHER): Payer: Self-pay | Admitting: *Deleted

## 2010-05-15 ENCOUNTER — Inpatient Hospital Stay (HOSPITAL_COMMUNITY)
Admission: EM | Admit: 2010-05-15 | Discharge: 2010-05-20 | Payer: Self-pay | Source: Home / Self Care | Attending: Internal Medicine | Admitting: Internal Medicine

## 2010-05-15 ENCOUNTER — Encounter (INDEPENDENT_AMBULATORY_CARE_PROVIDER_SITE_OTHER): Payer: Self-pay | Admitting: *Deleted

## 2010-05-19 LAB — GLUCOSE, CAPILLARY
Glucose-Capillary: 102 mg/dL — ABNORMAL HIGH (ref 70–99)
Glucose-Capillary: 118 mg/dL — ABNORMAL HIGH (ref 70–99)
Glucose-Capillary: 100 mg/dL — ABNORMAL HIGH (ref 70–99)
Glucose-Capillary: 112 mg/dL — ABNORMAL HIGH (ref 70–99)
Glucose-Capillary: 124 mg/dL — ABNORMAL HIGH (ref 70–99)
Glucose-Capillary: 144 mg/dL — ABNORMAL HIGH (ref 70–99)
Glucose-Capillary: 64 mg/dL — ABNORMAL LOW (ref 70–99)
Glucose-Capillary: 68 mg/dL — ABNORMAL LOW (ref 70–99)
Glucose-Capillary: 80 mg/dL (ref 70–99)
Glucose-Capillary: 83 mg/dL (ref 70–99)
Glucose-Capillary: 85 mg/dL (ref 70–99)
Glucose-Capillary: 85 mg/dL (ref 70–99)
Glucose-Capillary: 88 mg/dL (ref 70–99)
Glucose-Capillary: 92 mg/dL (ref 70–99)
Glucose-Capillary: 93 mg/dL (ref 70–99)
Glucose-Capillary: 97 mg/dL (ref 70–99)
Glucose-Capillary: 105 mg/dL — ABNORMAL HIGH (ref 70–99)
Glucose-Capillary: 107 mg/dL — ABNORMAL HIGH (ref 70–99)
Glucose-Capillary: 113 mg/dL — ABNORMAL HIGH (ref 70–99)
Glucose-Capillary: 118 mg/dL — ABNORMAL HIGH (ref 70–99)
Glucose-Capillary: 95 mg/dL (ref 70–99)
Glucose-Capillary: 96 mg/dL (ref 70–99)

## 2010-05-19 LAB — COMPREHENSIVE METABOLIC PANEL
ALT: 32 U/L (ref 0–35)
ALT: 37 U/L — ABNORMAL HIGH (ref 0–35)
AST: 23 U/L (ref 0–37)
AST: 31 U/L (ref 0–37)
Albumin: 2.9 g/dL — ABNORMAL LOW (ref 3.5–5.2)
Albumin: 2.1 g/dL — ABNORMAL LOW (ref 3.5–5.2)
Alkaline Phosphatase: 57 U/L (ref 39–117)
Alkaline Phosphatase: 48 U/L (ref 39–117)
BUN: 25 mg/dL — ABNORMAL HIGH (ref 6–23)
BUN: 12 mg/dL (ref 6–23)
CO2: 26 mEq/L (ref 19–32)
CO2: 23 mEq/L (ref 19–32)
Calcium: 8.3 mg/dL — ABNORMAL LOW (ref 8.4–10.5)
Calcium: 8.3 mg/dL — ABNORMAL LOW (ref 8.4–10.5)
Chloride: 98 mEq/L (ref 96–112)
Chloride: 112 mEq/L (ref 96–112)
Creatinine, Ser: 0.64 mg/dL (ref 0.4–1.2)
Creatinine, Ser: 0.42 mg/dL (ref 0.4–1.2)
GFR calc Af Amer: >60 mL/min (ref >60)
GFR calc Af Amer: >60 mL/min (ref >60)
GFR calc non Af Amer: >60 mL/min (ref >60)
GFR calc non Af Amer: >60 mL/min (ref >60)
Glucose, Bld: 120 mg/dL — ABNORMAL HIGH (ref 70–99)
Glucose, Bld: 73 mg/dL (ref 70–99)
Potassium: 2.5 mEq/L — CL (ref 3.5–5.1)
Potassium: 4.8 mEq/L (ref 3.5–5.1)
Sodium: 137 mEq/L (ref 135–145)
Sodium: 138 mEq/L (ref 135–145)
Total Bilirubin: 0.4 mg/dL (ref 0.3–1.2)
Total Bilirubin: 0.4 mg/dL (ref 0.3–1.2)
Total Protein: 5.7 g/dL — ABNORMAL LOW (ref 6.0–8.3)
Total Protein: 4.2 g/dL — ABNORMAL LOW (ref 6.0–8.3)

## 2010-05-19 LAB — URINALYSIS, ROUTINE W REFLEX MICROSCOPIC
Bilirubin Urine: NEGATIVE
Hgb urine dipstick: NEGATIVE
Ketones, ur: NEGATIVE mg/dL
Nitrite: NEGATIVE
Protein, ur: NEGATIVE mg/dL
Specific Gravity, Urine: 1.025 (ref 1.005–1.030)
Urine Glucose, Fasting: NEGATIVE mg/dL
Urobilinogen, UA: 0.2 mg/dL (ref 0.0–1.0)
pH: 6.5 (ref 5.0–8.0)

## 2010-05-19 LAB — DIFFERENTIAL
Basophils Absolute: 0 10*3/uL (ref 0.0–0.1)
Basophils Relative: 0 % (ref 0–1)
Eosinophils Absolute: 0.2 10*3/uL (ref 0.0–0.7)
Eosinophils Relative: 1 % (ref 0–5)
Lymphocytes Relative: 7 % — ABNORMAL LOW (ref 12–46)
Lymphs Abs: 1.1 10*3/uL (ref 0.7–4.0)
Monocytes Absolute: 1 10*3/uL (ref 0.1–1.0)
Monocytes Relative: 7 % (ref 3–12)
Neutro Abs: 12.7 10*3/uL — ABNORMAL HIGH (ref 1.7–7.7)
Neutrophils Relative %: 85 % — ABNORMAL HIGH (ref 43–77)

## 2010-05-19 LAB — CBC
HCT: 36.8 % (ref 36.0–46.0)
HCT: 33.2 % — ABNORMAL LOW (ref 36.0–46.0)
HCT: 28.7 % — ABNORMAL LOW (ref 36.0–46.0)
Hemoglobin: 12 g/dL (ref 12.0–15.0)
Hemoglobin: 10.3 g/dL — ABNORMAL LOW (ref 12.0–15.0)
Hemoglobin: 9 g/dL — ABNORMAL LOW (ref 12.0–15.0)
MCH: 24.7 pg — ABNORMAL LOW (ref 26.0–34.0)
MCH: 24.2 pg — ABNORMAL LOW (ref 26.0–34.0)
MCH: 24.6 pg — ABNORMAL LOW (ref 26.0–34.0)
MCHC: 32.6 g/dL (ref 30.0–36.0)
MCHC: 31 g/dL (ref 30.0–36.0)
MCHC: 31.4 g/dL (ref 30.0–36.0)
MCV: 75.7 fL — ABNORMAL LOW (ref 78.0–100.0)
MCV: 77.9 fL — ABNORMAL LOW (ref 78.0–100.0)
MCV: 78.4 fL (ref 78.0–100.0)
Platelets: 349 10*3/uL (ref 150–400)
Platelets: 361 10*3/uL (ref 150–400)
Platelets: 306 10*3/uL (ref 150–400)
RBC: 4.86 MIL/uL (ref 3.87–5.11)
RBC: 4.26 MIL/uL (ref 3.87–5.11)
RBC: 3.66 MIL/uL — ABNORMAL LOW (ref 3.87–5.11)
RDW: 13.7 % (ref 11.5–15.5)
RDW: 14.2 % (ref 11.5–15.5)
RDW: 14.3 % (ref 11.5–15.5)
WBC: 15 10*3/uL — ABNORMAL HIGH (ref 4.0–10.5)
WBC: 11 10*3/uL — ABNORMAL HIGH (ref 4.0–10.5)
WBC: 10.9 10*3/uL — ABNORMAL HIGH (ref 4.0–10.5)

## 2010-05-19 LAB — BASIC METABOLIC PANEL
BUN: 15 mg/dL (ref 6–23)
CO2: 27 mEq/L (ref 19–32)
Calcium: 8.6 mg/dL (ref 8.4–10.5)
Chloride: 110 mEq/L (ref 96–112)
Creatinine, Ser: 0.46 mg/dL (ref 0.4–1.2)
GFR calc Af Amer: >60 mL/min (ref >60)
GFR calc non Af Amer: >60 mL/min (ref >60)
Glucose, Bld: 81 mg/dL (ref 70–99)
Potassium: 4.4 mEq/L (ref 3.5–5.1)
Sodium: 143 mEq/L (ref 135–145)

## 2010-05-19 LAB — PHOSPHORUS: Phosphorus: 3.8 mg/dL (ref 2.3–4.6)

## 2010-05-19 LAB — URINE CULTURE
Colony Count: 45000
Culture  Setup Time: 201201121534

## 2010-05-19 LAB — MAGNESIUM: Magnesium: 2.5 mg/dL (ref 1.5–2.5)

## 2010-05-19 LAB — HEPATIC FUNCTION PANEL
ALT: 39 U/L — ABNORMAL HIGH (ref 0–35)
AST: 34 U/L (ref 0–37)
Albumin: 2.6 g/dL — ABNORMAL LOW (ref 3.5–5.2)
Alkaline Phosphatase: 56 U/L (ref 39–117)
Bilirubin, Direct: 0.1 mg/dL (ref 0.0–0.3)
Indirect Bilirubin: 0.5 mg/dL (ref 0.3–0.9)
Total Bilirubin: 0.6 mg/dL (ref 0.3–1.2)
Total Protein: 4.9 g/dL — ABNORMAL LOW (ref 6.0–8.3)

## 2010-05-19 LAB — LIPASE, BLOOD: Lipase: 33 U/L (ref 11–59)

## 2010-05-19 LAB — POTASSIUM: Potassium: 4.1 mEq/L (ref 3.5–5.1)

## 2010-05-20 ENCOUNTER — Encounter (INDEPENDENT_AMBULATORY_CARE_PROVIDER_SITE_OTHER): Payer: Self-pay | Admitting: *Deleted

## 2010-05-21 ENCOUNTER — Encounter: Payer: Self-pay | Admitting: Internal Medicine

## 2010-05-21 ENCOUNTER — Telehealth: Payer: Self-pay | Admitting: Internal Medicine

## 2010-05-21 LAB — BASIC METABOLIC PANEL
BUN: 13 mg/dL (ref 6–23)
BUN: 8 mg/dL (ref 6–23)
CO2: 24 mEq/L (ref 19–32)
CO2: 27 mEq/L (ref 19–32)
Calcium: 8.1 mg/dL — ABNORMAL LOW (ref 8.4–10.5)
Calcium: 8.1 mg/dL — ABNORMAL LOW (ref 8.4–10.5)
Chloride: 111 mEq/L (ref 96–112)
Chloride: 112 mEq/L (ref 96–112)
Creatinine, Ser: 0.44 mg/dL (ref 0.4–1.2)
Creatinine, Ser: 0.36 mg/dL — ABNORMAL LOW (ref 0.4–1.2)
GFR calc Af Amer: >60 mL/min (ref >60)
GFR calc Af Amer: >60 mL/min (ref >60)
GFR calc non Af Amer: >60 mL/min (ref >60)
GFR calc non Af Amer: >60 mL/min (ref >60)
Glucose, Bld: 103 mg/dL — ABNORMAL HIGH (ref 70–99)
Glucose, Bld: 96 mg/dL (ref 70–99)
Potassium: 3.2 mEq/L — ABNORMAL LOW (ref 3.5–5.1)
Potassium: 3.5 mEq/L (ref 3.5–5.1)
Sodium: 141 mEq/L (ref 135–145)
Sodium: 141 mEq/L (ref 135–145)

## 2010-05-21 LAB — GLUCOSE, CAPILLARY
Glucose-Capillary: 103 mg/dL — ABNORMAL HIGH (ref 70–99)
Glucose-Capillary: 88 mg/dL (ref 70–99)
Glucose-Capillary: 93 mg/dL (ref 70–99)
Glucose-Capillary: 96 mg/dL (ref 70–99)
Glucose-Capillary: 97 mg/dL (ref 70–99)
Glucose-Capillary: 98 mg/dL (ref 70–99)
Glucose-Capillary: 108 mg/dL — ABNORMAL HIGH (ref 70–99)
Glucose-Capillary: 108 mg/dL — ABNORMAL HIGH (ref 70–99)
Glucose-Capillary: 120 mg/dL — ABNORMAL HIGH (ref 70–99)
Glucose-Capillary: 127 mg/dL — ABNORMAL HIGH (ref 70–99)
Glucose-Capillary: 156 mg/dL — ABNORMAL HIGH (ref 70–99)
Glucose-Capillary: 67 mg/dL — ABNORMAL LOW (ref 70–99)

## 2010-05-21 LAB — DIFFERENTIAL
Basophils Absolute: 0 10*3/uL (ref 0.0–0.1)
Basophils Relative: 0 % (ref 0–1)
Eosinophils Absolute: 0.3 10*3/uL (ref 0.0–0.7)
Eosinophils Relative: 5 % (ref 0–5)
Lymphocytes Relative: 24 % (ref 12–46)
Lymphs Abs: 1.7 10*3/uL (ref 0.7–4.0)
Monocytes Absolute: 0.5 10*3/uL (ref 0.1–1.0)
Monocytes Relative: 7 % (ref 3–12)
Neutro Abs: 4.4 10*3/uL (ref 1.7–7.7)
Neutrophils Relative %: 64 % (ref 43–77)

## 2010-05-21 LAB — CBC
HCT: 29.5 % — ABNORMAL LOW (ref 36.0–46.0)
Hemoglobin: 9.4 g/dL — ABNORMAL LOW (ref 12.0–15.0)
MCH: 24.9 pg — ABNORMAL LOW (ref 26.0–34.0)
MCHC: 31.9 g/dL (ref 30.0–36.0)
MCV: 78 fL (ref 78.0–100.0)
Platelets: 310 10*3/uL (ref 150–400)
RBC: 3.78 MIL/uL — ABNORMAL LOW (ref 3.87–5.11)
RDW: 14 % (ref 11.5–15.5)
WBC: 6.9 10*3/uL (ref 4.0–10.5)

## 2010-05-22 ENCOUNTER — Telehealth: Payer: Self-pay | Admitting: Internal Medicine

## 2010-05-22 ENCOUNTER — Encounter: Payer: Self-pay | Admitting: Internal Medicine

## 2010-05-25 ENCOUNTER — Encounter: Payer: Self-pay | Admitting: General Surgery

## 2010-05-25 ENCOUNTER — Encounter: Payer: Self-pay | Admitting: Thoracic Surgery

## 2010-05-26 ENCOUNTER — Encounter: Payer: Self-pay | Admitting: Internal Medicine

## 2010-05-29 ENCOUNTER — Telehealth: Payer: Self-pay | Admitting: Internal Medicine
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
## 2010-06-03 ENCOUNTER — Encounter: Payer: Self-pay | Admitting: Internal Medicine

## 2010-06-03 NOTE — Miscellaneous (Signed)
Summary: [facility] Plans/[facility]  Care Plans/[facility]   Imported By: Sherian Rein 02/24/2010 14:52:54  _____________________________________________________________________  External Attachment:    Type:   Image     Comment:   External Document

## 2010-06-03 NOTE — Assessment & Plan Note (Signed)
Summary: FU/KH   Vital Signs:  Patient Profile:   70 Years Old Female Height:     63 inches Weight:      139 pounds Pulse rate:   96 / minute BP sitting:   131 / 81  Vitals Entered By: Lillia Pauls                 PCP:  Duke Salvia, MD  Chief Complaint:  fu L HIP PAIN.  History of Present Illness: S: 70 y/o female here today to f/u leg pain. Then patient was diagnosed with left leg trochanteric buristis at the last visit and was given an injection. She reports that the injection gave her very good pain relief.  Today she mostly complains of bilateral groin pain and pain around her entire right upper thigh.   note review of hip and pelvis xrays of 2007 these are unremarkable    Current Allergies: No known allergies       Physical Exam  General:     thin elderly female Msk:     right and left lower extremities NVI. left hip ext rotation 30 degrees and internal rotation 10 degrees. pain in groin with external rotation right hip ROM 30 degrees external rotation and 15 degrees internal rotation. No pain with log roll test. Pain with hip grind test bilaterally. Hip abduction and flexion strength 5/5 bilaterally. 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 Will start her on tramadol 50 mg by mouth q 6 hrs as needed. Hopefully this will help her to remain active. Her updated medication list for this problem includes:    Ultram 50 Mg Tabs (Tramadol hcl) ..... One tab by mouth q 6 hrs prn  Orders: [facility]- Est Level  3 (99213)  recheck in 1 to 2 mos  Problem # 2:  LEG PAIN, RIGHT (ICD-729.5) Assessment: Unchanged Still concern for lateral femoral cutaneous nerve syndrome or referred hip pain. Tramadol as above. Orders: [facility]- Est Level  3 (99213)   Problem # 3:  TROCHANTERIC BURSITIS, LEFT (ICD-726.5) Assessment: Improved Better, continue to  monitor  Complete Medication List: 1)  Hydrochlorothiazide 25 Mg Tabs (Hydrochlorothiazide) .Marland Kitchen.. 1 by mouth once daily 2)  Nexium 40 Mg Cpdr (Esomeprazole magnesium) .... Once daily 3)  Reglan 10 Mg Tabs (Metoclopramide hcl) .... Three times a day 4)  Metformin Hcl 500 Mg Tabs (Metformin hcl) .... Once daily 5)  Diovan 80 Mg Tabs (Valsartan) .Marland Kitchen.. 1 by mouth once daily 6)  Budeprion Xl 150 Mg Xr24h-tab (Bupropion hcl) .... Take 2 tablets by mouth evry morning 7)  Megace Oral 40 Mg/ml Susp (Megestrol acetate) .... Take 5 cc (1 teaspoon) by mouth once daily 8)  Klor-con M20 20 Meq Cr-tabs (Potassium chloride crys cr) .... Take one by mouth daily 9)  Ultram 50 Mg Tabs (Tramadol hcl) .... One tab by mouth q 6 hrs prn   Patient Instructions: 1)  start taking tramadol for your groin and hip pain (we have sent this to the pharmacy) 2)  check you A1c level  3)  f/u in 4-6 weeks   Prescriptions: ULTRAM 50 MG TABS (TRAMADOL HCL) ONE TAB by mouth q 6 hrs prn  #90 x 2   Entered by:   Altamese Cabal MD   Authorized by:   Enid Baas MD   Signed by:   Altamese Cabal MD on 02/01/2008  Method used:   Print then Give to Patient   RxID:   [id number]  ]

## 2010-06-03 NOTE — Progress Notes (Signed)
Summary: Order for continue home health aid   Phone Note From Other Clinic   Caller: Gerre Couch [phone]  Call For: Dr Juanda Chance Summary of Call: Verbal orders to continue home health aid. Her heart rate ranges from 120-126. Initial call taken by: Leanor Kail [facility],  February 05, 2010 1:04 PM  Follow-up for Phone Call        Left message for Cyndy to call back. Jesse Fall RN  February 05, 2010 1:57 PM   Home health nurse wanted Dr Juanda Chance to be aware pt HR is elevated in the 120s again.  No other new complaints.  Weight is stable.  Vomiting usually in the am x 1 only. They will send written orders for home health aid. Follow-up by: Jesse Fall RN,  February 05, 2010 2:22 PM  Additional Follow-up for Phone Call Additional follow up Details #1::        OK I will sign the written orders. Continue IV fluids as ordered and Labs q 2 weeks. Additional Follow-up by: Hart Carwin MD,  February 05, 2010 8:27 PM
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
## 2010-06-03 NOTE — Progress Notes (Signed)
Summary: TRIAGE-Pain in her Esophagus   Phone Note Call from Patient Call back at Home Phone [phone]   Caller: Patient Call For: DR.Eliane Hammersmith Summary of Call: Patient is having some trouble with her esophagus, she complains of pain when she swallows.  Initial call taken by: Harlow Mares CMA Duncan Dull),  August 29, 2009 2:22 PM  Follow-up for Phone Call        Pt. only sips water and chews on ice, she states she has discomfort when she swallows, X3 days. She hasn't been using her Prevacid Solu-tab for several weeks.   1) Restart Prevacid today, take it once daily. 2) If symptoms become worse call back immediately.   Follow-up by: Laureen Ochs LPN,  August 29, 2009 2:31 PM  Additional Follow-up for Phone Call Additional follow up Details #1::        She also ought to bo be on her Carafate slurry 10cc qid,#12 oz Additional Follow-up by: Hart Carwin MD,  August 29, 2009 11:01 PM    Additional Follow-up for Phone Call Additional follow up Details #2::    Above MD orders reviewed with patient. Pt. instructed to call back as needed.  Follow-up by: Laureen Ochs LPN,  August 30, 2009 8:06 AM  New/Updated Medications: CARAFATE 1 GM/10ML  SUSP (SUCRALFATE) Take 10cc by mouth 4 times daily. Prescriptions: CARAFATE 1 GM/10ML  SUSP (SUCRALFATE) Take 10cc by mouth 4 times daily.  #12oz. x 1   Entered by:   Laureen Ochs LPN   Authorized by:   Hart Carwin MD   Signed by:   Laureen Ochs LPN on 11/01/1599   Method used:   Electronically to        [id number] (retail)       [address]       [place], [place]  [id number]       Ph: [phone]       Fax: [phone]   RxID:   [id number]

## 2010-06-03 NOTE — Progress Notes (Signed)
Summary: TRIAGE   Phone Note Other Incoming   Caller: [name]  [phone] Summary of Call: Pt. has a PEG, is getting Jevity 70cc/hour over 18 hours.  Pt. is vomiting approx. 100cc of yellowish/greenish bile, since Saturday she has vomited 3-4 times. Denies  fever & pain. Pt. is very weak. BP is 112/60 and HR 130.  Pt. nurse feels she is dehydrated. They can bring pt. over for a 2:30pm appt. w/Amy Esterwood PAC today, tomorrow AM or take pt. to the ER. Cindy to discuss w/pt. daughter and callback with a decision. Initial call taken by: Laureen Ochs LPN,  June 03, 2009 1:21 PM  Follow-up for Phone Call        Pt. will see [facility] today at 2:30pm. Follow-up by: Laureen Ochs LPN,  June 03, 2009 1:29 PM

## 2010-06-03 NOTE — Miscellaneous (Signed)
Summary: 7/13-8/26 orders/[facility]  7/13-8/26 orders/[facility]   Imported By: Lester Loop 01/22/2010 09:11:03  _____________________________________________________________________  External Attachment:    Type:   Image     Comment:   External Document

## 2010-06-03 NOTE — Progress Notes (Signed)
Summary: Triage   Phone Note Call from Patient Call back at [phone]   Caller: Patient Call For: Dr. Juanda Chance Reason for Call: Talk to Nurse Summary of Call: pt. is having lower abd pain and diarrhea Initial call taken by: Karna Christmas,  January 30, 2010 9:39 AM  Follow-up for Phone Call        Patient  reports yesterday she had diarrhea and abdominal pain. She was taking vicodin and tylenol, this am she feels better. She reports she is a little sore but not as bad as yesterday.  Patient  is advised to take imodium as needed for diarrhea, vicodin or tylenol for pain and call back if her symptoms worsen.   Follow-up by: Darcey Nora RN, CGRN,  January 30, 2010 11:18 AM  Additional Follow-up for Phone Call Additional follow up Details #1::        reviewed and agree. Additional Follow-up by: Hart Carwin MD,  January 30, 2010 12:26 PM

## 2010-06-03 NOTE — Progress Notes (Signed)
Summary: Physician's handwritten orders/[facility]  Physician's handwritten orders/[facility]   Imported By: Sherian Rein 10/17/2009 13:54:16  _____________________________________________________________________  External Attachment:    Type:   Image     Comment:   External Document

## 2010-06-03 NOTE — Miscellaneous (Signed)
Summary: Order/[facility]  Order/[facility]   Imported By: Lester [place] 01/22/2010 09:07:48  _____________________________________________________________________  External Attachment:    Type:   Image     Comment:   External Document

## 2010-06-03 NOTE — Miscellaneous (Signed)
Summary: Order/[facility]  Order/[facility]   Imported By: Sherian Rein 12/30/2009 08:47:42  _____________________________________________________________________  External Attachment:    Type:   Image     Comment:   External Document

## 2010-06-03 NOTE — Progress Notes (Signed)
Summary: TRIAGE-ABD.PAIN   Phone Note Call from Patient Call back at Home Phone [phone]   Caller: Patient Call For: Juanda Chance Reason for Call: Talk to Nurse Summary of Call: Patient is having a lot of pain around her navel, started this am and is now having nausea feels "terrible". Initial call taken by: Tawni Levy,  July 03, 2009 1:32 PM  Follow-up for Phone Call        No answer, unable to leave a message. I will try back later. Laureen Ochs LPN  July 03, 4164 1:35 PM   Per pt. daughter, pt. c/o mid/lower abd. pain and nausea since 5am this morning. Tylenol isn't helping. DR.Kadden Osterhout PLEASE ADVISE  Follow-up by: Laureen Ochs LPN,  July 03, 2009 3:00 PM  Additional Follow-up for Phone Call Additional follow up Details #1::        could You , please, set her up for an endoscopy today?? Additional Follow-up by: Hart Carwin MD,  July 03, 2009 10:21 PM     Appended Document: TRIAGE-ABD.PAIN I have spoken to Ms.Sprankle' daughter who is going to call her aunt to see if she can bring Ms.Sherk in for endoscopy today. She is to call me back soon.  Appended Document: Orders Update I have spoken to Ms. Riebe' daughter who will have Ms.Canty here for endsocopy at 2 pm today for 3 pm procedure. I have given verbal instructions to daughter for patient to remain on clear liquids only until 2 hours prior to test at which time she should discontinue her fluids as well. They have also been made aware that someone 18 years or older must stay with the patient for the entire procedure. Quincy Carnes, RN has been made aware of add on patient. Per Daughter, patient is still complaining of nausea this a.m but has had no vomiting or fever. There has been no further abdominal pain and no dark black stools. Hortense Ramal CMA Duncan Dull)  July 04, 2009 8:32 AM   Clinical Lists Changes  Orders: Added new Test order of EGD (EGD) - Signed

## 2010-06-03 NOTE — Progress Notes (Signed)
Summary: Question about TNA order   Phone Note Other Incoming   Caller: Pam from [facility] [phone] Summary of Call: Question about the order for TNA Initial call taken by: Leanor [facility],  July 03, 2009 11:16 AM  Follow-up for Phone Call        Is the new TNA order in addition to the PEG feeding and IV fluids previously ordered or is the TNA taking place of the other fluids?  DR.Meriam Chojnowski PLEASE ADVISE  Follow-up by: Laureen Ochs LPN,  July 03, 2009 11:33 AM  Additional Follow-up for Phone Call Additional follow up Details #1::        the cTNA is in addition to her jejunal feeding order. Stop IV fluids. Additional Follow-up by: Hart Carwin MD,  July 03, 2009 12:53 PM    Additional Follow-up for Phone Call Additional follow up Details #2::    Above MD orders reviewed with Pam. She will callback as needed. Follow-up by: Laureen Ochs LPN,  July 03, 2009 1:14 PM

## 2010-06-03 NOTE — Miscellaneous (Signed)
Summary: Orders/[facility]  Orders/[facility]   Imported By: Sherian Rein 11/07/2009 15:08:10  _____________________________________________________________________  External Attachment:    Type:   Image     Comment:   External Document

## 2010-06-03 NOTE — Letter (Signed)
Summary: EGD Instructions  [facility]  [address] [place], [place] [address]   Phone: [phone]  Fax: [phone]       ORELIA BRANDSTETTER    [birth year]    MRN: [id number]       Procedure Day /Date: 01/10/10 Friday     Arrival Time: 1:00 pm     Procedure Time: 2:00 pm     Location of Procedure:                     _x   [facility] ( Outpatient Registration)   PREPARATION FOR ENDOSCOPY   On 01/10/10 THE DAY OF THE PROCEDURE:  1.   No solid foods, milk or milk products are allowed after midnight the night before your procedure.  2.   Do not drink anything colored red or purple.  Avoid juices with pulp.  No orange juice.  3.  You may drink clear liquids until 10:00 am, which is 4 hours before your procedure.                                                                                                CLEAR LIQUIDS INCLUDE: Water Jello Ice Popsicles Tea (sugar ok, no milk/cream) Powdered fruit flavored drinks Coffee (sugar ok, no milk/cream) Gatorade Juice: apple, white grape, white cranberry  Lemonade Clear bullion, consomm, broth Carbonated beverages (any kind) Strained chicken noodle soup Hard Candy   MEDICATION INSTRUCTIONS  Unless otherwise instructed, you should take regular prescription medications with a small sip of water as early as possible the morning of your procedure.             OTHER INSTRUCTIONS  You will need a responsible adult at least 70 years of age to accompany you and drive you home.   This person must remain in the waiting room during your procedure.  Wear loose fitting clothing that is easily removed.  Leave jewelry and other valuables at home.  However, you may wish to bring a book to read or an iPod/MP3 player to listen to music as you wait for your procedure to start.  Remove all body piercing jewelry and leave at home.  Total time from sign-in until discharge is approximately 2-3 hours.  You should  go home directly after your procedure and rest.  You can resume normal activities the day after your procedure.  The day of your procedure you should not:   Drive   Make legal decisions   Operate machinery   Drink alcohol   Return to work  You will receive specific instructions about eating, activities and medications before you leave.    The above instructions have been reviewed and explained to me by   Lamona Curl CMA Duncan Dull)  January 07, 2010 9:26 AM     I fully understand and can verbalize these instructions _____________________________ Date 01/07/10

## 2010-06-03 NOTE — Miscellaneous (Signed)
Summary: Care Plans/[facility]  Care Plans/[facility]   Imported By: Sherian Rein 01/20/2010 07:58:36  _____________________________________________________________________  External Attachment:    Type:   Image     Comment:   External Document

## 2010-06-03 NOTE — Progress Notes (Signed)
Summary: TRIAGE   Phone Note Call from Patient Call back at Home Phone [phone]   Caller: Rica-daughter Call For: Dr Juanda Chance Reason for Call: Talk to Nurse Summary of Call: Still throwqing up blood wonders if she needs to just go ahead and take her. Initial call taken by: Leanor Kail [facility],  October 30, 2009 12:22 PM  Follow-up for Phone Call        Pt's home health care nurse,Cindy, called and left a message that pt's basin was full from where pt. had been spitting/vomiting since yesterday. She says it looked like there may be some old blood, but couldn't tell what was from yesterday or today. she asked the pt. to empty the basin and she can see the new contents tomorrow, when she checks on pt. again. she did draw the CBCD today, she will have results faxed to our office.   Per pt. daughter, pt. continues to vomit, they were seeing more red blood than dark blood, so they have her at the [facility] ER at this time.  Pt. instructed to call back as needed.  Follow-up by: Laureen Ochs LPN,  October 30, 2009 1:42 PM

## 2010-06-03 NOTE — Miscellaneous (Signed)
Summary: Orders/[facility]  Orders/[facility]   Imported By: Sherian Rein 10/17/2009 07:23:08  _____________________________________________________________________  External Attachment:    Type:   Image     Comment:   External Document

## 2010-06-03 NOTE — Progress Notes (Signed)
Summary: Doesnt feel good   Phone Note Call from Patient Call back at Home Phone [phone]   Call For: Dr Juanda Chance Reason for Call: Talk to Nurse Summary of Call: Doesnt feel good and would like to speak to nurse. Initial call taken by: Leanor Kail [facility],  March 25, 2010 11:07 AM  Follow-up for Phone Call        Patient calling to report for the last 2-3 days , she has had "pain all in my stomach, cramps like my period is coming on." Reports diarrhea 2-3 times/day for the last 2-3 days also. She did notice some bright red, blood on the tissue when she wiped this AM. Patient states she has not taking anything for the diarrhea because she was out of Imodium. She states she will get some to have as needed. Also, c/o feeding tube feeling like it is pulling. Patient report that on Sunday, the feeding leaked onto her clothes and dressing but has not done this since then. C/O vomiting also and not sleeping. Patient states she is taking all her medications included anitnausea medications. Please, advise. Follow-up by: Jesse Fall RN,  March 25, 2010 11:30 AM  Additional Follow-up for Phone Call Additional follow up Details #1::        Imodium 1 by mouth three times a day pprn, please schedule barium study through the jejunal feeding tube to chech for displacement or stricture.  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 Regino Schultze recommendations. Barium study scheduled at [facility] radiology with Alisha for 03/31/10 at 9 AM with 8:30 AM check in. Patient instructed to be NPO after midnight prior to study. 

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
## 2010-06-03 NOTE — Progress Notes (Signed)
Summary: refill   Phone Note Call from Patient Call back at Home Phone [phone]   Caller: Patient Call For: Juanda Chance Reason for Call: Refill Medication Summary of Call: Patient woud like refills for Carafate called in to [facility] on  [address] Initial call taken by: Tawni Levy,  January 10, 2009 11:27 AM  Follow-up for Phone Call        called patient to ask how she is currently taking meds bc last time given looks like it was a slurry (06-26-2008), want to confirm dose before Rx is sent to the pharm. Left message on patients machine to call back.  Follow-up by: Harlow Mares CMA Duncan Dull),  January 10, 2009 4:47 PM  Additional Follow-up for Phone Call Additional follow up Details #1::        patient states that she just takes the tablets. patient aware rx sent. Additional Follow-up by: Harlow Mares CMA (AAMA),  January 10, 2009 4:49 PM    New/Updated Medications: SUCRALFATE 1 GM TABS (SUCRALFATE) take one by mouth two times a day Prescriptions: SUCRALFATE 1 GM TABS (SUCRALFATE) take one by mouth two times a day  #60 x 6   Entered by:   Harlow Mares CMA (AAMA)   Authorized by:   Hart Carwin MD   Signed by:   Harlow Mares CMA (AAMA) on 01/10/2009   Method used:   Electronically to        [facility]  [id number] (retail)       [address]       [place], [place]  [address]       Ph: [phone] or [phone]       Fax: [phone]   RxID:   [id number]
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
## 2010-06-03 NOTE — Consult Note (Signed)
Summary: [facility]   Imported By: Lester Willernie 02/28/2010 08:02:19  _____________________________________________________________________  External Attachment:    Type:   Image     Comment:   External Document
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
## 2010-06-03 NOTE — Letter (Signed)
Summary: [facility]   Imported By: Sherian Rein 06/26/2009 15:13:31  _____________________________________________________________________  External Attachment:    Type:   Image     Comment:   External Document

## 2010-06-03 NOTE — Progress Notes (Signed)
Summary: REcertification order    Phone Note Call from Patient   Caller: [facility] Noreene Larsson  [phone] Call For: Dr Juanda Chance Reason for Call: Talk to Nurse Summary of Call: Needs recertification notice for home health aid. Verbal order will be ok. Initial call taken by: Leanor Kail [facility],  December 11, 2009 4:23 PM  Follow-up for Phone Call        Lm for Noreene Larsson to call.  Lupita Leash Surface RN  December 12, 2009 8:55 AM  talked with Noreene Larsson.  OK for home health aid. Follow-up by: Ashok Cordia RN,  December 12, 2009 11:07 AM

## 2010-06-03 NOTE — Letter (Signed)
Summary: Results Letter  [facility]  [address] [place], [place] [address]   Phone: [phone]  Fax: [phone]        January 27, 2008 MRN: [id number]    Emily [name] [address] [place], [place]  [address]    Dear [name],    The gastric emptying scan, again, confirms what we  have anticipated, that Your stomach does not empty. Fortunately Your blood test pertaining to Your nutritional status are favorable despite of Your weight loss. As we discussed, I would like You to see Dr Jacqulyn Bath at [facility]. I will try to arrange that.He has a special interest in motility disorders like Yours. I will contact You.         Sincerely,  Hart Carwin MD  This letter has been electronically signed by your physician.  Appended Document: Results Letter Letter sent to patient.  Appended Document: Results Letter I have spoken to the patient about her GES.  She has been advised that our office sent her a letter in regards to this.  I have asked her to disregard the statement about seeing Dr Jacqulyn Bath at [facility].  Unfortunetly, they do not accept her insurance.  I have asked Dr Juanda Chance if she would like to send the patient elsewhere.  Patient is aware that I will call her back as soon as I have Dr Regino Schultze recommendations.  Appended Document: Results Letter Per Dr Juanda Chance, we should try Dr Hildred Priest office rather than Ninfa Linden.  I have called to make an appointment for patient.  Dr Johny Sax (sounds like Adriana Simas) is actually the specialist for gastroparesis not Dr Mellody Memos.  Patient has been advised that she has been given an appointment with Dr Johny Sax for 04/11/08 @ 8:00am.  Patient is to recieve paperwork in the mail from Dr Pierce Crane office.  Dr Juanda Chance has been made aware of the appointment and is agreeable to this appointment.   Appended Document: Results Letter    Clinical Lists Changes  Orders: Added new Referral order of Misc. Referral (Misc. Ref) - Signed

## 2010-06-03 NOTE — Progress Notes (Signed)
Summary: SAMPLES   Phone Note Call from Patient Call back at Home Phone [phone]   Caller: Patient Call For: [place]  Reason for Call: Talk to Nurse Details for Reason: samples Summary of Call: pt would like samples of Nexium - meds have not arrived yet  Initial call taken by: Guadlupe Spanish [facility],  July 03, 2008 11:03 AM  Follow-up for Phone Call        Patient advised to come pick up nexium samples. Follow-up by: Hortense Ramal CMA,  July 03, 2008 11:53 AM

## 2010-06-03 NOTE — Letter (Signed)
Summary: [facility]   Imported By: Sherian Rein 07/03/2009 13:54:25  _____________________________________________________________________  External Attachment:    Type:   Image     Comment:   External Document

## 2010-06-03 NOTE — Progress Notes (Signed)
Summary: TNA Orders   Phone Note From Pharmacy   Summary of Call: [facility], [facility], wants Dr.Rosemae Mcquown to direct pt. fluid/calorie needs for the TPN, since pt. is going to continue the PEG feedings.   DR.Gamble Enderle:Please call Eunice Blase at [phone]  Initial call taken by: Laureen Ochs LPN,  July 03, 2009 2:56 PM  Follow-up for Phone Call        done Follow-up by: Hart Carwin MD,  July 03, 2009 9:37 PM

## 2010-06-03 NOTE — Miscellaneous (Signed)
Summary: Orders/[facility]  Orders/[facility]   Imported By: Sherian Rein 09/11/2009 11:32:03  _____________________________________________________________________  External Attachment:    Type:   Image     Comment:   External Document

## 2010-06-03 NOTE — Progress Notes (Signed)
Summary: questions concerning surgery scheduled today   Phone Note Call from Patient Call back at Home Phone [phone]   Caller: Patient Reason for Call: Talk to Nurse Summary of Call: Patient has a GI blockage and needs surgery, pending today.  Patient is worried she should not undergo surgery until she has seen cardiologist.  Patient is to report to hospital at 1pm today. Initial call taken by: Burnard Leigh,  March 08, 2009 9:54 AM  Follow-up for Phone Call        I spoke with the pt and she is scheduled to have surgery today at [facility]. The pt wanted to know if her "heart is strong enough to handle the surgery".  I told the pt that our office does not have the details of her surgery and that I cannot answer her question.  I told the pt that the only information I could tell her is based off her cardiac cath from December 2009.  At that time the pt's coronary arteries and EF were normal.  We felt her chest pain was not cardiac and we released her to follow-up with Dr [name].  I told the pt that her surgeon should have requested cardiac clearance if this was needed prior to her procedure.  The pt said she was unsure if she wanted to proceed with surgery and I told her that would need to be discussed with her surgeon if she had further questions. Follow-up by: Julieta Gutting, RN, BSN,  March 08, 2009 10:12 AM

## 2010-06-03 NOTE — Progress Notes (Signed)
Summary: Triage   Phone Note Call from Patient Call back at Home Phone [phone]   Caller: Daughter Howrika  Call For: Dr. Juanda Chance Reason for Call: Talk to Nurse Summary of Call: Pt is in the hospital and she has some questions about the TPN Initial call taken by: Karna Christmas,  July 10, 2009 1:36 PM  Follow-up for Phone Call        Pt. is in [facility], admitted 07-09-09.  Pt. daughter thinks pt. is supposed to be on TPN while in the hospital, but she isn't getting it.   DR.BRODIE PLEASE ADVISE.  Follow-up by: Laureen Ochs LPN,  July 10, 2009 1:59 PM  Additional Follow-up for Phone Call Additional follow up Details #1::        The cTNA order  has to be in by noon time for it to be started at 6.pm every day. Her order went in after 12.00 noon yesterday and therefore her cTNA will start tonight at 6.00 pm. Please call daughter to clarify. Additional Follow-up by: Hart Carwin MD,  July 10, 2009 4:25 PM    Additional Follow-up for Phone Call Additional follow up Details #2::    Above MD orders reviewed with patient's daughter. She will callback as needed. Follow-up by: Laureen Ochs LPN,  July 10, 2009 4:29 PM

## 2010-06-03 NOTE — Miscellaneous (Signed)
Summary: Orders/[facility]  Orders/[facility]   Imported By: Sherian Rein 12/12/2009 10:47:53  _____________________________________________________________________  External Attachment:    Type:   Image     Comment:   External Document

## 2010-06-03 NOTE — Letter (Signed)
Summary: CMN for Pump Kit/[facility]  CMN for Pump Kit/[facility]   Imported By: Sherian Rein 11/19/2009 13:15:08  _____________________________________________________________________  External Attachment:    Type:   Image     Comment:   External Document

## 2010-06-03 NOTE — Miscellaneous (Signed)
Summary: TPN Orders/[facility]  TPN Orders/[facility]   Imported By: Sherian Rein 07/15/2009 07:55:54  _____________________________________________________________________  External Attachment:    Type:   Image     Comment:   External Document

## 2010-06-03 NOTE — Progress Notes (Signed)
Summary: continue care   Phone Note Call from Patient Call back at [phone]   Caller: sister, Corrie Dandy Call For: Dr. Juanda Chance Reason for Call: Talk to Nurse Summary of Call: has questions about what pt is to do next with her care... thinks that Dottie  told pt's daughter "there is nothing else we can do for you" Initial call taken by: Vallarie Mare,  October 28, 2009 2:55 PM  Follow-up for Phone Call        Actually, I spoke to patient's daughter, [name] and she was never told that "there is nothing else we can do." She was told that [facility] declined to take on Ms Hatton as a patient due to the fact that she has had a complete work up by both Dr Juanda Chance and Dr [name] at [facility]. In fact, [employer] normally sends their gastroparesis patients to Dr [name]. I advised Howrika that per Dr Juanda Chance, we would be glad to continue seeing Ms Brandel or we would be glad to re-refer her to see Dr [name] again. Howrika said she would "think about it" and call us back. Sister, Corrie Dandy was advised of this as well and she verbalizes understandning. Corrie Dandy seems to have many questions regarding her sister's condition. She states, "she is still spitting." "Can ya'll make that stop?" I have explained that this has been an ongoing problem and that we are working to help Ms.Schweppe as much as possible with her condition and she needs to follow our recommendations as well. Follow-up by: Lamona Curl CMA Duncan Dull),  October 28, 2009 3:49 PM     Appended Document: continue care I agree with the above analysis. I woul not mind her checkin back with Dr [name] since he has not seen her for past 6 months.  Appended Document: continue care Above MD orders reviewed with patient. Pt. states she will c/b when she is ready for [place] to schedule an appt. w/Dr.Koch.
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
## 2010-06-03 NOTE — Progress Notes (Signed)
Summary: TRIAGE   Phone Note Call from Patient Call back at Home Phone [phone]   Caller: Daughter Call For: Dr. Juanda Chance Reason for Call: Talk to Nurse Summary of Call: n/v, some right side pain. Initial call taken by: Vallarie Mare,  Sep 24, 2009 8:24 AM  Follow-up for Phone Call        Pt. had n/v over the weekend, still not feeling well.  Denies fever. Right side hurt on Saturday, no pain today. Has an appt. tomorrow. Pt. daughter can bring her in tomorrow or have her see Willette Cluster NP today, daughter will speak w/pt. and callback on appt. Follow-up by: Laureen Ochs LPN,  Sep 24, 2009 9:54 AM

## 2010-06-03 NOTE — Miscellaneous (Signed)
Summary: Plan/[facility]  Plan/[facility]   Imported By: Lester [place] 11/08/2009 10:33:49  _____________________________________________________________________  External Attachment:    Type:   Image     Comment:   External Document

## 2010-06-03 NOTE — Letter (Signed)
Summary: [facility]   Imported By: Lester [place] 07/11/2009 13:37:43  _____________________________________________________________________  External Attachment:    Type:   Image     Comment:   External Document

## 2010-06-03 NOTE — Miscellaneous (Signed)
Summary: [facility]  [facility]   Imported By: Lester [place] 04/04/2010 10:24:07  _____________________________________________________________________  External Attachment:    Type:   Image     Comment:   External Document

## 2010-06-03 NOTE — Miscellaneous (Signed)
Summary: Physician's orders/[facility]  Physician's orders/[facility]   Imported By: Sherian Rein 09/12/2009 07:52:45  _____________________________________________________________________  External Attachment:    Type:   Image     Comment:   External Document
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
## 2010-06-03 NOTE — Progress Notes (Signed)
Summary: Triage-PICC Line   Phone Note Other Incoming   Caller: Amy [facility]  [phone] Summary of Call: Needs a order on  the pt.'s PICC Line Initial call taken by: Karna Christmas,  August 20, 2009 3:50 PM  Follow-up for Phone Call        Per pt. nurse, pt. came home from the hospital with a PICC Line, does it need to be maintained or pulled? Pt. is doing well with her PEG feedings.   DR.Yates Weisgerber PLEASE ADVISE Follow-up by: Laureen Ochs LPN,  August 20, 2009 4:17 PM  Additional Follow-up for Phone Call Additional follow up Details #1::        needs to be in till I see her in the office ( or a PA) Additional Follow-up by: Hart Carwin MD,  August 20, 2009 10:29 PM    Additional Follow-up for Phone Call Additional follow up Details #2::    Spoke to pt. daughter, pt. will keep appt. with Dr.Nuh Lipton on 09-09-09 at 3:30pm. I have left a msg. for Amy, pt. nurse, with above MD instructions.  Follow-up by: Laureen Ochs LPN,  August 21, 2009 8:47 AM

## 2010-06-03 NOTE — Progress Notes (Signed)
Summary: TRIAGE-VOMTIING   Phone Note Call from Patient Call back at [phone]   Caller: Patient Call For: Dr. Juanda Chance Reason for Call: Talk to Nurse Summary of Call: pt is vomiting and it is very dark Initial call taken by: Karna Christmas,  October 29, 2009 10:34 AM  Follow-up for Phone Call        Per pt., since 3am, she is vomiting coffee ground/dark material and having some abd.pain. She states she was in the [facility] ER last night because her feeding tube fell out, the ER MD replaced the tube.  The vomiting began approx. 1 hour after she got home from the ER.  She has Zofran and Scopalamine patches, but continues to vomit.  DR.Viola Kinnick PLEASE ADVISE    Follow-up by: Laureen Ochs LPN,  October 29, 2009 12:09 PM  Additional Follow-up for Phone Call Additional follow up Details #1::        Stay on IV fluids 1000cc/24 hrs via PICC line, please recheck renal profile, CBC if possible at home when her Homecare nurse comes. If she does not have any, then she needs to come to our office in am for blood work. Call in Phenergan 25 mg supp, # 20, 1 q6-8 hrs as needed nausea Additional Follow-up by: Hart Carwin MD,  October 29, 2009 2:36 PM    Additional Follow-up for Phone Call Additional follow up Details #2::    Above MD orders reviewed with patient and Jodelle Gross, Home Health Nurse.  [name] states the pt. vomits daily, she vomited a large amt. of fluid yesterday when she was there. A renal profile was drawn yesterday, she will draw a CBCD in the morning. Script for Phenergan already sent to pharmacy. If symptoms become worse call back immediately or go to ER.  Follow-up by: Laureen Ochs LPN,  October 29, 2009 2:53 PM  Additional Follow-up for Phone Call Additional follow up Details #3:: Details for Additional Follow-up Action Taken: I did not see any renal profile results from ER. Additional Follow-up by: Hart Carwin MD,  October 29, 2009 2:58 PM  Renal proflie drawn by home health nurse yesterday,  results reviewed w/Dr.Timica Marcom by phone. New order given to [facility] in the [facility]: Decrease IV K+ to . Laureen Ochs LPN  October 29, 2009 3:14 PM

## 2010-06-03 NOTE — Progress Notes (Signed)
Summary: Questions about meds.   Phone Note Call from Patient Call back at [phone]   Caller: Patient Call For: Dr. Juanda Chance Reason for Call: Talk to Nurse Summary of Call: has some questions about her meds. Initial call taken by: Karna Christmas,  November 28, 2009 9:32 AM  Follow-up for Phone Call        when she went to the pharmacy she was only given a 120 cc bottle of carafate instead of a 1 month supply.  I have sent her a new RX with 10 cc qid or 1200 cc supply. Follow-up by: Darcey Nora RN, CGRN,  November 28, 2009 10:41 AM    Prescriptions: CARAFATE 1 GM/10ML  SUSP (SUCRALFATE) Take 10cc by mouth 4 times daily.  #1200 cc x 6   Entered by:   Darcey Nora RN, CGRN   Authorized by:   Hart Carwin MD   Signed by:   Darcey Nora RN, CGRN on 11/28/2009   Method used:   Electronically to        [id number] (retail)       [address]       [place], [place]  [id number]       Ph: [phone]       Fax: [phone]   RxID:   [id number]

## 2010-06-03 NOTE — Miscellaneous (Signed)
Summary: Physician orders/[facility]  Physician orders/[facility]   Imported By: Sherian Rein 08/14/2009 09:45:33  _____________________________________________________________________  External Attachment:    Type:   Image     Comment:   External Document

## 2010-06-03 NOTE — Progress Notes (Signed)
Summary: Emily [name] is making her sick   Phone Note Call from Patient Call back at [phone]   Call For: Dr Juanda Chance Summary of Call: Her Gbag for her stomach is not emptying like its supposed to. It's making her sick.  Initial call taken by: Leanor Kail [facility],  January 03, 2010 11:35 AM  Follow-up for Phone Call        Patient reports that her G- tube is working again.  She is still having occasional vomiting.  Reviewed with patient Dr Regino Schultze recommendations.  The tube flushes easily.  She is advised to try different postion changes to aid in gravity drainage.  Patient  verblaized understanding. Follow-up by: Darcey Nora RN, CGRN,  January 03, 2010 12:09 PM

## 2010-06-03 NOTE — Miscellaneous (Signed)
Summary: TNA Order / [facility]  TNA Order / [facility]   Imported By: Lennie Odor 07/08/2009 14:01:53  _____________________________________________________________________  External Attachment:    Type:   Image     Comment:   External Document

## 2010-06-03 NOTE — Miscellaneous (Signed)
Summary: Order/[facility]  Order/[facility]   Imported By: Lester [place] 01/14/2010 10:33:27  _____________________________________________________________________  External Attachment:    Type:   Image     Comment:   External Document

## 2010-06-03 NOTE — Progress Notes (Signed)
Summary: Triage   Phone Note Call from Patient Call back at Home Phone [phone]   Caller: [name] @[facility]---Call pt. back Call For: Dr. Juanda Chance Reason for Call: Talk to Nurse Summary of Call: HR is 124/26.Marland KitchenMarland KitchenBP is ok, nausea and vomiting, "feels like she cannot breath at times" Initial call taken by: Karna Christmas,  March 13, 2010 1:08 PM  Follow-up for Phone Call        Patient reports vomiting at 3:30 AM for which she took at Phenergan suppository and had relief until 8:00 AM. At that time the vomiting began again. She states she has vomited 6-7 times today.  Cindy with [facility] told patient her heart rate was elevated at 126. Patient has taken Zofran and Phenergan suppositories but still having nausea and vomiting. Also reports she has a headache. Please, advise. Spoke with Mike Gip, PA. Orders given:1.Restart 1 liter NS IV daily via PICC today. Orders called to and faxed to [facility] pharmacy. 2. CBC, Bmet stat 03/14/10 in AM. Order called to [name] [facility] nurse. 3. have patient keep "big tube" open. 4. If vomiting persists in large amounts and patient feels she is getting dehyrated, go to ER. Patient and her daughter given Mike Gip, [place] recommendations.  Follow-up by: Jesse Fall RN,  March 13, 2010 1:42 PM     Appended Document: Triage    Clinical Lists Changes  Orders: Added new Test order of TLB-CBC Platelet - w/Differential (85025-CBCD) - Signed Added new Test order of TLB-BMP (Basic Metabolic Panel-BMET) (80048-METABOL) - Signed

## 2010-06-03 NOTE — Progress Notes (Signed)
Summary: Erythromycin   Phone Note Call from Patient Call back at Home Phone [phone]   Caller: Patient Call For: Dr. Juanda Chance Reason for Call: Talk to Nurse Summary of Call: pt has ?'s about medication she was rx'ed by Dr. [name]... med is Erythromycin Initial call taken by: Vallarie Mare,  December 24, 2008 11:31 AM  Follow-up for Phone Call        patient has questions about erythromycin that was perscribed by Dr [name].  I have asked her to call their office.  She reports that they have told her they have no record of her being there last week.  I have sent her a copy of some information on Gastroparesis/erythromycin in the mail Follow-up by: Darcey Nora RN, CGRN,  December 24, 2008 1:31 PM

## 2010-06-03 NOTE — Letter (Signed)
Summary: [facility]   Imported By: Lamona Curl CMA ([facility]) 03/17/2010 17:02:30  _____________________________________________________________________  External Attachment:    Type:   Image     Comment:   External Document
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
## 2010-06-03 NOTE — Progress Notes (Signed)
Summary: Hosp. D/C med ?  Medications Added TRANSDERM-SCOP 1.5 MG PT72 (SCOPOLAMINE BASE) Apply 2 patches and change every 72 hours.       Phone Note From Other Clinic Call back at [phone]   Caller: Nyoka Lint, [facility] Call For: Dr. Juanda Chance Reason for Call: Schedule Patient Appt Summary of Call: has quetions about instructions for  this pt Initial call taken by: Vallarie Mare,  October 11, 2009 3:47 PM  Follow-up for Phone Call        [name] is calling to clarify pt. discharge meds-K+ and Scopalamine patch order. Per Amy Esterwood PAC- Potassium via tube daily and Scopalamine patch #2 1.5mg  patches to be changed every 72 hours. [name] will take care of the K+ order and I have called the Scopalamine patch to the pt. pharmacy. Cindy/Pt. will callback as needed. Follow-up by: Laureen Ochs LPN,  October 11, 2009 4:07 PM    New/Updated Medications: TRANSDERM-SCOP 1.5 MG PT72 (SCOPOLAMINE BASE) Apply 2 patches and change every 72 hours.

## 2010-06-03 NOTE — Miscellaneous (Signed)
Summary: Assessment referral/CCME  Assessment referral/CCME   Imported By: Lester [place] 11/25/2009 10:34:58  _____________________________________________________________________  External Attachment:    Type:   Image     Comment:   External Document

## 2010-06-03 NOTE — Progress Notes (Signed)
Summary: IV fluids/Lab Orders   Phone Note Outgoing Call   Call placed by: Laureen Ochs LPN,  October 18, 2009 2:58 PM Summary of Call: Pt. Home Health Nurse, Nyoka Lint, is out of town, asked me to call [facility], [phone] ext.[phone] or [phone], to give them pt. PICC lin IV fluid orders. Orders given to [facility] via phone and faxed to her: Daily hydration with 1,000cc Normal Saline with KCL. Initial call taken by: Laureen Ochs LPN,  October 18, 2009 3:03 PM  Follow-up for Phone Call        DR.Reneta Niehaus--What labs other than renal profile does she need drawn and how often?  Follow-up by: Laureen Ochs LPN,  October 18, 2009 3:09 PM  Additional Follow-up for Phone Call Additional follow up Details #1::        renal profile once a week, CBC every 2 weeks Additional Follow-up by: Hart Carwin MD,  October 19, 2009 9:15 PM    Additional Follow-up for Phone Call Additional follow up Details #2::    Lab orders faxed to [facility] pharmacy at [phone]. Follow-up by: Laureen Ochs LPN,  October 21, 2009 8:47 AM

## 2010-06-03 NOTE — Progress Notes (Signed)
Summary: TRIAGE-J tube is sore   Phone Note Call from Patient Call back at Home Phone [phone]   Caller: [facility]-Cindy JOnes Call For: Dr Juanda Chance Summary of Call: J tube is really sore on the inside and bothers her. Initial call taken by: Leanor Kail [facility],  Sep 03, 2009 9:38 AM  Follow-up for Phone Call        Per pt. daughter, pt. c/o pain at J-tube site, also reddened and has greenish drainage, would like her to be seen ASAP.  Pt. will see Mike Gip [facility] today at 2pm. Follow-up by: Laureen Ochs LPN,  Sep 04, 979 10:10 AM

## 2010-06-03 NOTE — Letter (Signed)
Summary: Gastro/[facility]  [facility]astro/[facility]   Imported By: Lester Elgin 01/04/2009 10:38:52  _____________________________________________________________________  External Attachment:    Type:   Image     Comment:   External Document

## 2010-06-03 NOTE — Letter (Signed)
Summary: [facility]  [facility]   Imported By: Maryln Gottron 10/04/2008 14:13:29  _____________________________________________________________________  External Attachment:    Type:   Image     Comment:   External Document

## 2010-06-03 NOTE — Miscellaneous (Signed)
Summary: Plan of Care & Treatment/[facility]  Plan of Care & Treatment/[facility]   Imported By: Sherian Rein 09/11/2009 11:30:32  _____________________________________________________________________  External Attachment:    Type:   Image     Comment:   External Document

## 2010-06-03 NOTE — Letter (Signed)
Summary: [facility]   Imported By: Sherian Rein 07/03/2009 13:55:24  _____________________________________________________________________  External Attachment:    Type:   Image     Comment:   External Document

## 2010-06-03 NOTE — Progress Notes (Signed)
Summary: TRIAGE--Begin TNA   Phone Note Other Incoming   Caller: [name] from [facility] [phone] Summary of Call: Needs to know about TPN - has not received any orders. Initial call taken by: Leanor Kail [facility],  July 02, 2009 12:02 PM  Follow-up for Phone Call        Per OV note from 06-24-09, Dr.Josue Falconi will see pt. on 07-15-09 for f/u, she will determine the need for TPN at that time. Message left for [facility] with above information and she can callback for any further questions/concerns. Follow-up by: Laureen Ochs LPN,  July 02, 2009 1:07 PM  Additional Follow-up for Phone Call Additional follow up Details #1::        Actually, I have spoken to Dr.Rosenbower since then and he wants me to  go ahaed with the cTNA.I need the order sheet to start it. Can they fax it to [place]? Additional Follow-up by: Hart Carwin MD,  July 02, 2009 1:32 PM    Additional Follow-up for Phone Call Additional follow up Details #2::    Per [name], I will need to call [phone] ext.[phone] to give verbal order for TNA. I spoke with Eunice Blase the pharmacist and have faxed order for TNA per pharmacy protocol and dosing per pharmacy protocol and pt. records to her. Order approved by Dr.Anwar Sakata.  (fax# [phone]) Follow-up by: Laureen Ochs LPN,  July 02, 2009 1:56 PM

## 2010-06-03 NOTE — Discharge Summary (Signed)
Summary: Partial Gastric-Outlet Obstruction       NAME:  Emily [name], Emily [name]              ACCOUNT NO.:  [id number]      MEDICAL RECORD NO.:  [id number]          PATIENT TYPE:  INP      LOCATION:  5505                         FACILITY:  [facility]      PHYSICIAN:  Adolph Pollack, M.D.DATE OF BIRTH:  1941/04/23      DATE OF ADMISSION:  04/16/2009   DATE OF DISCHARGE:  05/31/2009                                  DISCHARGE SUMMARY      PRINCIPLE DISCHARGE DIAGNOSIS:  Partial gastric-outlet obstruction.      SECONDARY DIAGNOSES:   1. Severe gastroparesis (preexisting).   2. Sinus tachycardia.   3. Protein-calorie malnutrition.   4. Enterococcal urinary tract infection.   5. Type 2 diabetes mellitus.   6. Hypomagnesemia.      PROCEDURES:   1. Gastrojejunostomy and feeding jejunostomy on May 20, 2009.   2. Upper endoscopy and dilatation of kink on May 22, 2009.   3. Percutaneous endoscopic gastrostomy on May 24, 2009.      CONSULTATIONS:   1. [facility].   2. [facility].      REASON FOR ADMISSION:  Emily [name] is a 70 year old female with known   severe gastroparesis.  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  She   was seen by Dr. Sharlee Blew of [facility], Dr. [name].  Both felt that a drainage procedure   may be helpful to help restore some gastric motility and thus she was   admitted for that plus a feeding jejunostomy.      HOSPITAL COURSE:  She underwent a gastrojejunostomy and feeding   jejunostomy.  In the recovery room that evening, she pulled out the NG   tube and next morning had some vomiting and then she had the tube   replaced under fluoroscopy.  On her second postoperative day, she had   some mild tachycardia and fever, thus she was sent for  a stat CT scan to   make sure the gastrojejunostomy was not disrupted and placed on empiric   Invanz.  The CT was negative for any leaks and showed the   gastrojejunostomy was patent.  Emily [name] was stopped.  She had some urinary   retention, had to have a Foley replaced.  A urine culture was positive for infection with Enterococcus and she was started on  appropriate antibiotics for that.  She continued to have   intermittent sinus tachycardia throughout entire hospitalization, seemed   to be related to nausea and vomiting and some mild dehydration at times.   She had a full evaluation by Cardiology and had a CT scan that was   negative for pulmonary embolism or abscess in the early January.  We   began trying her on nasogastric tube clamping trials to see if she could   tolerate this.  We tried Reglan and  erythromycin.  On May 08, 2009,   an upper GI was performed, which demonstrated patency of the   anastomosis, but severe delayed gastric emptying.  She was started on   jejunostomy tube feeds soon postoperatively and these were worked up to   goal then she was placed on 18-hour tube feedings.  GI consultation was   obtained on May 20, 2009.  The upper endoscopy done that day   demonstrated widely patent gastrojejunostomy, but a questionable   stenosis or kink distal to the gastrojejunostomy.  Next day an upper GI   was performed demonstrating 8 cm distally to the gastrojejunostomy and   abrupt turner kink.  There was severe delayed gastric emptying noting.   On May 22, 2009, an upper endoscopy and dilatation of kink was   performed.  This did not improve the NG output or her nausea, which was   persistent.  She has had CT scans of her brain to look for etiology of   nausea, this was negative.      On May 24, 2009, a percutaneous endoscopic gastrostomy was performed   and she was placed a drainage.  She had been maintained on oral   Lopressor for her tachycardia.  She was on  sliding scale insulin for her   diabetes.  Eventually we talked to her how to open up the G-tube and   drain the G-tube when she was nauseated.  She was tolerating Jevity 1.5   for 18-hour feeds with acceptable nutritional value and was holding her   weight at 51 kg with this.  She was also getting free water through   this.  Her nausea started to improve.  However, she still was putting   out forward a 500 mL of yellowish-green output from the G-tube when she   would open it and drained.  We arranged for her to be discharged to home   on May 30, 2009, with home health care helping her with 18-hour tube   feedings and giving her free water.  She was ambulatory at the time,   moving her bowels.  She was in satisfactory condition.      DISPOSITION:  Discharged to home on May 30, 2009.  We will discharge   her home on some Reglan.  We will stop her beta-blocker and just put her   back on her calcium channel blocker, which she is on preop for   hypertension.  We will put her back on her oral hypoglycemics.  She will   be given some Vicodin elixir for pain medicine.  She knows how to vent   her G-tube should she get nauseated.  She is welcome to try some clear   liquids at home.  She will be following up in the office in 1 week.               Adolph Pollack, M.D.            Kari Baars  D:  06/20/2009  T:  06/20/2009  Job:  [id number]         Electronically Signed by Avel Peace M.D. on 06/25/2009 05:23:08 PM

## 2010-06-03 NOTE — Letter (Signed)
Summary: CMN for Pump/[facility]  CMN for [facility]   Imported By: Sherian Rein 10/17/2009 07:20:33  _____________________________________________________________________  External Attachment:    Type:   Image     Comment:   External Document

## 2010-06-03 NOTE — Letter (Signed)
Summary: Patient [facility] Biopsy Results  [facility]  [address] [place], [place] [address]   Phone: [phone]  Fax: [phone]        January 19, 2008 MRN: [id number]    Emily [name] [address] [place], [place]  [address]    Dear Ms. Poon,  I am pleased to inform you that the biopsies taken during your recent endoscopic examination did not show any evidence of cancer upon pathologic examination.The biopsies show ulcerated stomach and esophagus due to reflux  Additional information/recommendations:    _x_ Please call [phone] to schedule a return visit to review      your condition.  _x_ Continue with the treatment plan as outlined on the day of your      exam.  __.   Please call us if you are having persistent problems or have questions about your condition that have not been fully answered at this time.  Sincerely,  Hart Carwin MD  This letter has been electronically signed by your physician.

## 2010-06-03 NOTE — Miscellaneous (Signed)
Summary: Orders/[facility]  Orders/[facility]   Imported By: Lester Petersburg 03/31/2010 09:21:40  _____________________________________________________________________  External Attachment:    Type:   Image     Comment:   External Document

## 2010-06-03 NOTE — Progress Notes (Signed)
Summary: TRIAGE   Phone Note Call from Patient Call back at Home Phone [phone]   Caller: Patient Call For: Terrianne Cavness Reason for Call: Talk to Nurse Summary of Call: Patient states that she was supposed to get an rx sent to her pharmacy yesterday for nausea but it's not in her pharmacy, pt wants to know what happened  Initial call taken by: Tawni Levy,  October 31, 2008 11:49 AM  Follow-up for Phone Call        Refill sent to pharmacy, pt. aware. Pt. instructed to call back as needed.  Follow-up by: Laureen Ochs LPN,  October 31, 2008 12:06 PM      Prescriptions: PROMETHAZINE HCL 12.5 MG TABS (PROMETHAZINE HCL) ! tab by mouth two times a day as needed  #30 x 1   Entered by:   Laureen Ochs LPN   Authorized by:   Hart Carwin   Signed by:   Laureen Ochs LPN on 96/29/5284   Method used:   Electronically to        [facility]  [address] [facility] [id number] (retail)       [address]  [address]       [place], [place]  [address]       Ph: [phone] or [phone]       Fax: [phone]   RxID:   [id number]

## 2010-06-03 NOTE — Progress Notes (Signed)
Summary: [facility]   Phone Note From Other Clinic   Caller: [facility]   [phone] Call For: Dr. Juanda Chance Summary of Call: They want to go Daily for nursing for 7 days to reinforce teaching on the TPN until the daughter is comfortable Initial call taken by: Karna Christmas,  July 05, 2009 3:04 PM  Follow-up for Phone Call        That is fine. We are trying to work on getting patient placed into a skilled nursing facility at this time. I have left a message for [facility] with the above information. Follow-up by: Hortense Ramal CMA Duncan Dull),  July 05, 2009 3:11 PM

## 2010-06-03 NOTE — Procedures (Signed)
Summary: Upper Endoscopy  Patient: Emily [name] Note: All result statuses are Final unless otherwise noted.  Tests: (1) Upper Endoscopy (EGD)   EGD Upper Endoscopy       DONE     [facility]     [address]     [place], [place]  [address]           ENDOSCOPY PROCEDURE REPORT           PATIENT:  Emily [name], Emily [name]  MR#:  [id number]     BIRTHDATE:  12-29-1940, 68 yrs. old  GENDER:  female           ENDOSCOPIST:  Barbette Hair. Arlyce Dice, MD     Referred by:           PROCEDURE DATE:  05/20/2009     PROCEDURE:  EGD with enteroscopy     ASA CLASS:  Class II     INDICATIONS:  nausea and vomiting           MEDICATIONS:   Fentanyl 75 mcg IV, Versed 5 mg IV     TOPICAL ANESTHETIC:  Cetacaine Spray           DESCRIPTION OF PROCEDURE:   After the risks benefits and     alternatives of the procedure were thoroughly explained, informed     consent was obtained.  The EG-[id number] ([id number]) and EC-[id number]     ([id number]) pediatric colonoscope was introduced through the mouth     and advanced to the proximal jejunum, without limitations.  The     instrument was slowly withdrawn as the mucosa was fully examined.           Anastomosis was noted. 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 Pyloric stenosis.     Unable to pass scope through  stenosis in the proximal jejunum.     3-4cm distal to gastrojejunostomy there appears to be a stenotic     or kinked area. Pediatric colonoscope was passed with difficulty.     Jejunum distal to this area was entirely normal.    Retroflexed     views revealed no abnormalities.    The scope was then withdrawn     from the patient and the procedure completed.           COMPLICATIONS:  None           ENDOSCOPIC IMPRESSION:     1) Patent Anastomosis     2) Stenosis at the pylorus     3) Possible Stenosis in the proximal jejunum     RECOMMENDATIONS:     1) repeat UGI series with directed exam at the jejunum beyond     the anastamosis           REPEAT EXAM:  No           ______________________________     Barbette Hair. Arlyce Dice, MD           CC:  Emily Peace, MD, Emily Sar, MD           n.     eSIGNED:   Barbette Hair. Kaplan at 05/20/2009 04:47 PM           Emily [name], [id number]  Note: An exclamation mark (!) indicates a result that was not dispersed into the flowsheet. Document Creation Date: 05/20/2009 4:47 PM _______________________________________________________________________  (1) Order result status: Final Collection or observation date-time: 05/20/2009 16:37 Requested date-time:  Receipt date-time:  Reported date-time:  Referring Physician:   Ordering Physician: Emily [name] [phone]) Specimen Source:  Source: Emily [name] Order Number: [id number] Lab site:

## 2010-06-03 NOTE — Progress Notes (Signed)
Summary: TRIAGE   Phone Note Call from Patient Call back at Home Phone [phone]   Caller: Daughter Summary of Call: patient daughter is calling states that her mother needs phenergan supp., but Dr. Juanda Chance did not give the last rx for these but she would like Dr. Juanda Chance to give the rx from now on. I told her since it is almost 5pm she will most likely get a call back tomorrow she was ok with that. Initial call taken by: Harlow Mares CMA Duncan Dull),  August 26, 2009 4:59 PM  Follow-up for Phone Call        Pt. was discharged from the hospital in March with Zofran 8mg , she takes it two times a day. Pt. daughter, [name], states pt. gets nauseated in between doses and requests phenergan suppositories.   DR.Deondrea Aguado PLEASE ADVISE  Follow-up by: Laureen Ochs LPN,  August 27, 2009 8:14 AM  Additional Follow-up for Phone Call Additional follow up Details #1::        It is OK to give Phenergan 25 mg, # 30,  1 PR q 4-6 hrs as needed nausea, 1 refill Additional Follow-up by: Hart Carwin MD,  August 27, 2009 1:05 PM    Additional Follow-up for Phone Call Additional follow up Details #2::    Above MD orders reviewed with patient's daughter. Pt. instructed to call back as needed.  Follow-up by: Laureen Ochs LPN,  August 27, 2009 1:43 PM  New/Updated Medications: PROMETHAZINE HCL 25 MG SUPP (PROMETHAZINE HCL) insert rectally every  4-6 hours as needed  for nausea. Prescriptions: PROMETHAZINE HCL 25 MG SUPP (PROMETHAZINE HCL) insert rectally every  4-6 hours as needed  for nausea.  #30 x 1   Entered by:   Laureen Ochs LPN   Authorized by:   Hart Carwin MD   Signed by:   Laureen Ochs LPN on 78/46/9629   Method used:   Electronically to        [id number] (retail)       [address]       [place], [place]  [id number]       Ph: [phone]       Fax: [phone]   RxID:   [id number]

## 2010-06-03 NOTE — Letter (Signed)
Summary: Home Health Letter  [facility]  [address] [place], [place] [address]   Phone: [phone]  Fax: [phone]            04/01/2010     KELEIGH KAZEE [address] [place], [place]  [address]  To Whom It May Concern:  This letter is on behalf of my patient, Emily [name] DOB April 15, 1941. Ms. Gemme has been a patient under my care for multiple gastrointestinal concerns since at least 2008. She has severe gastroesophageal reflux, gastroparesis and esophageal stricture. She is status post Nissen Fundoplication and Belsey Procedure via left thoracotomy. She had a gastrojajunostomy and placement of a feeding jejunostomy in December 2010. She is using jejunal tube feedings to maintain her nutrition and has a gastrostomy tube to drain excess fluid from her stomach. Her weight has decreased to 108 pounds from her initial weight of 189 pounds several years ago. Ms. Milne'continued declining state of health has made her weak and fragile. She is often times unable to perform activities of daily living on her own due to this. She is currently being followed at [facility] in consideration for an upcoming total gastrectomy due to the severity of her symptoms.  It is my medical opinion that Ms.Sebastiano needs home assistance from a nurse or CNA at least 5 days per week. She needs help insuring that her tube feedings are maintained, sorting meals and peforming other daily activities. Thank you for your prompt attention and response to this matter. Should I be able to be of any assistance, please contact me at my office [phone].  Sincerely,     Hedwig Morton. Juanda Chance, MD

## 2010-06-03 NOTE — Miscellaneous (Signed)
Summary: Orders/[facility]  Orders/[facility]   Imported By: Sherian Rein 02/24/2010 14:54:05  _____________________________________________________________________  External Attachment:    Type:   Image     Comment:   External Document

## 2010-06-03 NOTE — Miscellaneous (Signed)
Summary: Orders/[facility]  Orders/[facility]   Imported By: Sherian Rein 03/13/2010 15:20:10  _____________________________________________________________________  External Attachment:    Type:   Image     Comment:   External Document

## 2010-06-03 NOTE — Miscellaneous (Signed)
Summary: Physician Orders/[facility]  Physician Orders/[facility]   Imported By: Sherian Rein 07/08/2009 09:50:35  _____________________________________________________________________  External Attachment:    Type:   Image     Comment:   External Document

## 2010-06-03 NOTE — Miscellaneous (Signed)
Summary: ER report  Lupie, Sawa - MRN: [id number] Acct#: [id number] PHYSICIAN DOCUMENTATION SHEET Sat Oct 29 22:43:43 EDT 2011 [facility] [address] [place], [place] [address] PHONE: [phone] MRN: [id number] Account #: [id number] Name: Emily [name], Emily [name] Sex: F Age: 70 DOB: [birth year] Complaint: Abdominal pain Primary Diagnosis: Chronic abdominal pain Arrival Time: 03/01/2010 01:57 Discharge Time: 03/01/2010 09:45 All Providers: Ms. Drucie Opitz - PA; Ms Remi Haggard - FNP [facility] ED Med); Dr. Paula Libra - MD PROVIDER: Ms. Drucie Opitz - PA HPI: The patient is a 70 year old female who presents with a chief complaint of abdominal pain. The history was provided by the patient and prior chart. patient who was recently admitted to hospital from Oct 18 to Oct 26 for abdominal pain, n/v/d who has hx of severe gastroparesis, PEG tube, bile and acid reflux returns to ER with complaint of nonbloody diarrhea that started yesterday with acute onset abdominal cramping with n/v today and severe sharp abdominal pain. states she has taken all of her regular GI meds. took PR phenergan this morning with mild relief of nausea long enough to take PO meds. 03:44 03/01/2010 by Drucie Opitz - PA, Ms. ROS: Statement: all systems negative except as marked or noted in the HPI 04:29 03/01/2010 by Drucie Opitz - PA, Ms. PMH: Documentation: physician assistant reviewed/amended Historian: patient, prior chart Patient's Current Physicians Patient's Current Physicians (please list PCP first) [facility], Virgie Dad - GI, Hedwig Morton Rosenbower - GSurg, Jim Desanctis Past medical history: hypertension, diabetes, gastroparesis, GERD, headache, hiatal hernia, pancreatitis NOTE - GASTRIC OUTLET OBSTUTUCTION Surgical History: cholecystectomy, gastrostomy NOTE - S/P G AND J TTUBE Social History: non-smoker, non-drinker, no drug abuse TB Screen: no symptoms present Travel History: no recent air travel, no  recent domestic travel, no recent foreign travel Special Needs: none 1 Emily [name], Emily [name] - MRN: [id number] Acct#: [id number] Allergies Drug Reaction Allergy Note NKDA 03:44 03/01/2010 by Drucie Opitz - PA, Ms. Home Medications: Documentation: physician assistant reviewed/amended Medications Medication [Medication] Dosage Frequency Last Dose metFORMIN Oral 500mg  twice a day acetaminophen Oral 325mg  as needed amitriptyline Oral 25mg  at bed time bisacodyl Oral 10mg  once a day CarafATE Oral 1g/59ml four times a day HYDROcodoneacetaminophen Oral every 6 hours Jevity Plus 1.2 Cal Oral 5.8 can LORazepam Oral 0.25mg  every 6 hours Maalox Oral four times a day Other NS w/ 20 meq 1liter once a day Promethazine VC Oral 12.5mg  every 4 hours ProTONIX Oral 40mg  twice a day Reglan Oral 10mg  every 6 hours Zofran Oral 4mg  twice a day zolpidem Oral 10mg  at bed time Voltaren Oral as needed 03:44 03/01/2010 by Drucie Opitz - PA, Ms. Physical examination: Vital signs and O2 SAT: reviewed Constitutional: well developed, well nourished, well hydrated, in no acute distress Head and Face: normocephalic, atraumatic Eyes: normal appearance Neck: supple, full range of motion Spine: entire spine non-tender Cardiovascular: regular rhythm, no murmur, rub, or gallop, tachycardia, normal S1/S2 Respiratory: normal, breath sounds clear & equal bilaterally, no rales, rhonchi, wheezes, or rub Chest: nontender, no deformity, movement normal, no crepitus Abdomen: soft, epigastric tenderness, tenderness is moderate, no masses, no hepatosplenomegaly, hepatomegaly, no guarding, no rebound tenderness Extremities: normal Neuro: AA&Ox3 Skin: color normal, no rash, warm, dry NOTE - PEG tubing with TTP but no sign of cellulitis 2 Emily [name], Emily [name] - MRN: [id number] Acct#: [id number] Psychiatric: AA&Ox4 Originally published: 05:37 03/01/2010 by Drucie Opitz - PA, Ms. Amended: 05:38 03/01/2010 by Drucie Opitz - PA,  Ms. Reviewed result: Result Type: Cleda Daub: [id number] Step  Type: LAB Procedure Name: CBC WITH DIFF Procedure: CBC WITH DIFF Result: WBC COUNT 15.9 K/uL [4.0-10.5] H RBC COUNT 4.45 MIL/uL [3.87-5.11] HEMOGLOBIN 11.1 g/dL [41.3-24.4] L HEMATOCRIT 34.8 % [36.0-46.0] L MCV 78.2 fL [78.0-100.0] MCH 25.0 pg [26.0-34.0] L MCHC 32.0 g/dL [01.0-27.2] RDW 53.6 % [11.5-15.5] PLATELET COUNT 416 K/uL [150-400] H NEUTROPHIL 86 % [43-77] H ABS GRANULOCYTE 13.7 K/uL [1.7-7.7] H LYMPHOCYTE 7 % [12-46] L ABS LYMPH 1.2 K/uL [0.7-4.0] MONOCYTE 6 % [3-12] ABS MONOCYTE 0.9 K/uL [0.1-1.0] EOSINOPHIL 0 % [0-5] ABS EOS 0.1 K/uL [0.0-0.7] BASOPHIL 0 % [0-1] ABS BASO 0.0 K/uL [0.0-0.1] 03:41 03/01/2010 by Drucie Opitz - PA, Ms. Reviewed result: Result Type: PRELIM_RESULT Orderno: [id number] Step Type: LAB Procedure Name: COMPREHENSIVE METABOLIC PANEL Procedure: COMPREHENSIVE METABOLIC PANEL Procedure Notes: GFR, Est Afr Am - The eGFR has been calculated using the MDRD equation. This calculation has not been validated in all clinical situations. eGFR's persistently <60 mL/min signify possible Chronic Kidney Disease. Result: SODIUM 138 mEq/L [135-145] POTASSIUM 3.0 mEq/L [3.5-5.1] L CHLORIDE 104 mEq/L [96-112] CARBON DIOXIDE 27 mEq/L [19-32] BUN 8 mg/dL [2-59] CREATININE 5.63 mg/dL [8.7-5.6] 3 Emily [name], Emily [name] - MRN: [id number] Acct#: [id number] CALCIUM 8.3 mg/dL [4.1-66.0] L TOTAL PROTEIN 5.1 g/dL [6.3-0.1] L ALBUMIN 2.7 g/dL [6.0-1.0] L AST/SGOT 24 U/L [0-37] ALT/SGPT 20 U/L [0-35] ALKALINE PHOSPHATASE 76 U/L [39-117] BILIRUBIN, TOTAL 0.6 mg/dL [9.3-2.3] GFR, Est Non Af Am >60 mL/min [>60] GFR, Est Afr Am >60 mL/min [>60] 04:19 03/01/2010 by Drucie Opitz - PA, Ms. Reviewed result: Result Type: Cleda Daub: [id number] Step Type: LAB Procedure Name: LIPASE Procedure: LIPASE Result: LIPASE 24 U/L [11-59] 04:19 03/01/2010 by Drucie Opitz - PA, Ms. Reviewed  result: Result Type: Cleda Daub: [id number] Step Type: LAB Procedure Name: URINE MACROSCOPIC Procedure: URINE MACROSCOPIC Procedure Notes: LEUKOCYTE ESTERASE - MICROSCOPIC NOT DONE ON URINES WITH NEGATIVE PROTEIN, BLOOD, LEUKOCYTES, NITRITE, OR GLUCOSE <1000 mg/dL. Result: URINE COLOR YELLOW [YELLOW] URINE APPEARANCE CLEAR [CLEAR] URINE SPEC GRAVITY 1.026 [1.005-1.030] URINE PH 7.5 [5.0-8.0] URINE GLUCOSE NEGATIVE mg/dL [NEG] URINE HEMOGLOBIN NEGATIVE [NEG] URINE BILIRUBIN NEGATIVE [NEG] URINE KETONES NEGATIVE mg/dL [NEG] URINE TOTAL PROTEIN NEGATIVE mg/dL [NEG] URINE UROBILINOGEN 0.2 mg/dL [7.6-2.8] URINE NITRITE NEGATIVE [NEG] LEUKOCYTE ESTERASE NEGATIVE [NEG] 04:27 03/01/2010 by Drucie Opitz - PA, Ms. [address] Shayleen, Emily [name] - MRN: [id number] Acct#: [id number] Reviewed result: Result Type: UPDATED_RESULT Orderno: [id number] Step Type: LAB Procedure Name: COMPREHENSIVE METABOLIC PANEL Procedure: COMPREHENSIVE METABOLIC PANEL Procedure Notes: GFR, Est Afr Am - The eGFR has been calculated using the MDRD equation. This calculation has not been validated in all clinical situations. eGFR's persistently <60 mL/min signify possible Chronic Kidney Disease. Result: SODIUM 138 mEq/L [135-145] POTASSIUM 3.0 mEq/L [3.5-5.1] L CHLORIDE 104 mEq/L [96-112] CARBON DIOXIDE 27 mEq/L [19-32] GLUCOSE 92 mg/dL [94-85] BUN 8 mg/dL [4-62] CREATININE 7.03 mg/dL [5.0-0.9] CALCIUM 8.3 mg/dL [3.8-18.2] L TOTAL PROTEIN 5.1 g/dL [9.9-3.7] L ALBUMIN 2.7 g/dL [1.6-9.6] L AST/SGOT 24 U/L [0-37] ALT/SGPT 20 U/L [0-35] ALKALINE PHOSPHATASE 76 U/L [39-117] BILIRUBIN, TOTAL 0.6 mg/dL [7.8-9.3] GFR, Est Non Af Am >60 mL/min [>60] GFR, Est Afr Am >60 mL/min [>60] 04:29 03/01/2010 by Drucie Opitz - PA, Ms. Reviewed result: Result Type: Cleda Daub: [id number] Step Type: XRAY Procedure Name: DG ABD ACUTE W/CHEST Procedure: DG ABD ACUTE W/CHEST Result: Clinical Data: Abdominal pain. Diabetic  gastroparesis. Former smoker. ACUTE ABDOMEN SERIES (ABDOMEN 2 VIEW /T/ CHEST 1 VIEW) Comparison: 12/23/2009 Findings: A percutaneous gastrostomy tube is again seen, and a jejunostomy tube is seen in the lower abdomen and pelvis.  Scattered small bowel air fluid levels are seen however there is no evidence of dilated bowel loops. No evidence of free air. Surgical clips again noted from prior cholecystectomy. 5 Emily [name], Emily [name] - MRN: [id number] Acct#: [id number] Heart size is normal. Both lungs are clear. Left arm PICC line remains in appropriate position with the tip in expected region of the cavoatrial junction. Old right rib fracture deformities again noted. IMPRESSION: 1. Nonspecific, nonobstructive bowel gas pattern. 2. No active cardiopulmonary disease. 04:59 03/01/2010 by Drucie Opitz - PA, Ms. ED Course: Comments: IV morphine, zofran and potassium 04:44 03/01/2010 by Drucie Opitz - PA, Ms. ED Course: Comments: Reviewed Labs and Imaging with Dr. Marina Goodell who states he is familiar with patient who has home health who is able to administer IV fluids. Patient states pain and nausea has resolved and is having PO challage. Dr. Marina Goodell made aware of leukocytosis and states given patient is afebrile and normal abdimal series then to IV hydrate and have pateint tolerate PO fluds and d/c home for follow up with Dr. Leone Payor as outpatient. 05:43 03/01/2010 by Drucie Opitz - PA, Ms. ED Course: Comments: patient has not had dirrhea while in ER, stool collected from bag 05:55 03/01/2010 by Drucie Opitz - PA, Ms. Patient disposition: Primary Diagnosis: chronic abdominal pain Additional diagnoses: gastroparesis, hypokalemia, nausea, vomiting and diarrhea Counseling: advised of diagnosis, advised of treatment plan, advised of xray and lab findings, advised of need for close follow-up, advised of need to return for worsening or changing symptoms, advised of specific symptoms that should prompt their  return, patient voices understanding 06:16 03/01/2010 by Drucie Opitz - PA, Ms. Prescriptions: Prescription Medication Dispense Sig Line HYDROcodoneacetaminophen 5 mg-325 mg Tab twenty (20) 1-2 tabs by mouth every 4-6 hours as needed for pain 06:14 03/01/2010 by Drucie Opitz - PA, Ms. Medication disposition: 6 Emily [name], Emily [name] - MRN: [id number] Acct#: [id number] Medications Medication [Medication] Dosage Frequency Last Dose Medication disposition PCP contact metFORMIN Oral 500mg  twice a day continue acetaminophen Oral 325mg  as needed continue amitriptyline Oral 25mg  at bed time continue bisacodyl Oral 10mg  once a day continue CarafATE Oral 1g/6ml four times a day continue HYDROcodoneacetaminophen Oral every 6 hours continue Jevity Plus 1.2 Cal Oral 5.8 can continue LORazepam Oral 0.25mg  every 6 hours continue Maalox Oral four times a day continue Other NS w/ 20 meq 1liter once a day continue Promethazine VC Oral 12.5mg  every 4 hours continue ProTONIX Oral 40mg  twice a day continue Reglan Oral 10mg  every 6 hours continue Zofran Oral 4mg  twice a day continue zolpidem Oral 10mg  at bed time continue Voltaren Oral as needed continue 06:15 03/01/2010 by Drucie Opitz - PA, Ms. Discharge: Discharge Instructions: *free text, b.r.a.t. diet, chronic pain exacerbation - general, chronic pain exacerbation - plan in advance and legal statement, hypokalemia - with potassium supplementation Append a Note to Discharge Instructions: Use vicodin as needed for pain. Call Dr. Juanda Chance on Monday to schedule close follow up for re-evaluation of abdominal pain, nausea, vomiting and diarrhea. Return to ER for changing or worsening of symptoms. Referral/Appointment Refer Patient To: Phone Number: Follow-up in Appointment Details: Devoria Albe [phone] Drug Instructions: pain acetaminophen hydrocodone 06:17 03/01/2010 by Drucie Opitz - PA, Ms. [address] Emily [name], Emily [name] -  MRN: [id number] Acct#: [id number] Documentation completed 06:19 03/01/2010 by Drucie Opitz - PA, Ms. PROVIDER: Ms Remi Haggard - FNP [facility] ED Med) Patient disposition: Patient disposition: Disch - Home Primary Diagnosis: chronic abdominal pain Additional diagnoses: gastroparesis, hypokalemia, nausea, vomiting and diarrhea Counseling:  advised of diagnosis, advised of treatment plan, advised of xray and lab findings, advised of need for close follow-up, advised of need to return for worsening or changing symptoms, advised of specific symptoms that should prompt their return, patient voices understanding 08:30 03/01/2010 by Remi Haggard - FNP [facility] ED Med), Ms Documentation completed by Mid-level Provider 11:42 03/01/2010 by Remi Haggard - FNP [facility] ED Med), Ms PROVIDER: Dr. Paula Libra - MD Chart electronically signed by ER Physician 22:43 03/01/2010 by Paula Libra - MD, Dr. Attending: Supervision of: Midlevel: I have personally performed and participated in all the services and procedures documented herein. I have reviewed the findings with the patient. 22:43 03/01/2010 by Paula Libra - MD, Dr. Libby Maw orders: Verify orders: verify all orders 22:43 03/01/2010 by Paula Libra - MD, Dr. Aura Fey Lists Changes

## 2010-06-03 NOTE — Progress Notes (Signed)
Summary: consult  Medications Added REGLAN 5 MG/ML SOLN (METOCLOPRAMIDE HCL) Take 4 tsps  four times daily       Phone Note Call from Patient Call back at [phone]   Caller: Patient Call For: Dr. Juanda Chance Reason for Call: Talk to Nurse Summary of Call: would like to speak to a nurse about her dysphagia Initial call taken by: Vallarie Mare,  January 20, 2010 8:38 AM  Follow-up for Phone Call        Patient states her dysphagia returned yesterday.  She is taking carafate and Protonix , but doesn't seem to be helping.  She is wondering if there is another med that will help with dysphagia. Follow-up by: Darcey Nora RN, CGRN,  January 20, 2010 8:45 AM  Additional Follow-up for Phone Call Additional follow up Details #1::        We can increase the Reglan to 10 mg by mouth ac and  hs. Additional Follow-up by: Hart Carwin MD,  January 20, 2010 9:24 AM    Additional Follow-up for Phone Call Additional follow up Details #2::    patient aware.   Follow-up by: Darcey Nora RN, CGRN,  January 20, 2010 9:49 AM  New/Updated Medications: REGLAN 5 MG/ML SOLN (METOCLOPRAMIDE HCL) Take 4 tsps  four times daily Prescriptions: REGLAN 5 MG/ML SOLN (METOCLOPRAMIDE HCL) Take 4 tsps  four times daily  #1 month x 6   Entered by:   Darcey Nora RN, CGRN   Authorized by:   Hart Carwin MD   Signed by:   Darcey Nora RN, CGRN on 01/20/2010   Method used:   Electronically to        [id number] (retail)       [address]       [place], [place]  [id number]       Ph: [phone]       Fax: [phone]   RxID:   [id number]

## 2010-06-03 NOTE — Progress Notes (Signed)
Summary: CONDITION UPDATE   Phone Note Call from Patient Call back at Home Phone [phone]   Caller: Patient Call For: Dr. Juanda Chance Reason for Call: Talk to Nurse Complaint: Chest Pain Details for Reason: symptoms worsened Summary of Call: pt wanted to report that her symptoms from her last appt (last Tuesday) have worsened. Initial call taken by: Vallarie Mare,  October 16, 2008 11:32 AM  Follow-up for Phone Call        Pt. continues with c/o loss of appetite, constant nausea, "I am worried, I just don't want anything to eat"  Taking Reglan two times a day, "I told her I can't take it more than that, it makes me too sleepy" Takes Nexium two times a day. Pt. states she is drinking Ensure, but c/o that anything she eats or drinks, "Feels like it is going to come back up" She doesn't vomit, just has constant nausea. DR.Amyri Frenz PLEASE ADVISE Follow-up by: Laureen Ochs LPN,  October 16, 2008 11:40 AM  Additional Follow-up for Phone Call Additional follow up Details #1::        please let pt call Dr [name] in [place] who is following her for the same problem. He sent me a letter in which he reports pt's improvement . I had some reservations about that statement. He ought to know that pt continues to lose weight down to 128lbs and having the same symptoms. That's why I send her there so they could help me. Additional Follow-up by: Hart Carwin,  October 16, 2008 6:14 PM    Additional Follow-up for Phone Call Additional follow up Details #2::    Pt. states she called Dr.Koch's office yesterday and was told her GES was delayed, his nurse is supposed to follow-up with pt. to treat. Pt. advised to follow Dr.Koch's plan of care and to call us as needed. Follow-up by: Laureen Ochs LPN,  October 17, 2008 8:46 AM    Prescriptions: NEXIUM 40 MG CPDR (ESOMEPRAZOLE MAGNESIUM) 1 tablet by mouth two times a day  #180 x 3   Entered by:   Laureen Ochs LPN   Authorized by:   Hart Carwin   Signed by:    Laureen Ochs LPN on 41/66/0630   Method used:   Electronically to        [id number] (retail)       [address]       [place], [place]  [address]       Ph: [phone] or [phone]       Fax: [phone]   RxID:   [id number]

## 2010-06-03 NOTE — Progress Notes (Signed)
Summary: Potasium IV   Phone Note Other Incoming   Caller: [facility] [phone] Nyoka Lint Summary of Call: IV for Potasium- do they need to continue daily? Initial call taken by: Leanor Kail [facility],  November 28, 2009 12:17 PM  Follow-up for Phone Call        [facility]indy [facility]from [facility].  She is getting 20 meq of K in each liter of NS, has been getting this for the last several weeks.  Does she need to continue the 20 K in each liter or ok to change to NS, Cindy did labs on 11/27/09 and we will be getting a copy today or tomorrow.  Per yoiur office note it is just liters of NS.  Please advise Follow-up by: Darcey Nora RN, CGRN,  November 28, 2009 12:26 PM  Additional Follow-up for Phone Call Additional follow up Details #1::        last K+ 3.6 on 11/21/2009, she is supposed to have labs every Wednesday , I don't see  results from this past Wed 11/27/2009. Please continue K+ in IV fluids unless we call You otherwise.- 20 mEq/1 liter of Normal saline. Additional Follow-up by: Hart Carwin MD,  November 30, 2009 11:59 AM    Additional Follow-up for Phone Call Additional follow up Details #2::    Cindy notified Follow-up by: Darcey Nora RN, CGRN,  December 02, 2009 8:36 AM

## 2010-06-03 NOTE — Progress Notes (Signed)
Summary: TRIAGE   Phone Note Other Incoming Call back at [phone]   Caller: Hamilton Capri, PATIENT'S DAUGHTER Details for Reason: TRIAGE Summary of Call: Pt's daughter, Savreen Gebhardt, would like for you to call her about pt. Initial call taken by: Schuyler Amor,  March 13, 2010 2:01 PM  Follow-up for Phone Call        Spoke with patient's daughter. She is very concerned about her mother. She states her mother is now weighing 98 lbs. She is wondering if patient should go back on IVF. She would like Willette Cluster, RNP to call her to discuss if possible. Follow-up by: Jesse Fall RN,  March 13, 2010 2:20 PM    Additional Follow-up for Phone Call Additional follow up Details #2::    Spoke with daughter and gave her recommendations per Mike Gip, PA Follow-up by: Jesse Fall RN,  March 13, 2010 5:02 PM

## 2010-06-03 NOTE — Consult Note (Signed)
[facility]: [facility]   Imported By: Roderic Ovens 04/30/2009 16:18:40  _____________________________________________________________________  External Attachment:    Type:   Image     Comment:   External Document

## 2010-06-03 NOTE — Procedures (Signed)
Summary: Upper Endoscopy w/DIL  Patient: Emily [name] Note: All result statuses are Final unless otherwise noted.  Tests: (1) Upper Endoscopy w/DIL (UED)  UED Upper Endoscopy w/DIL                             DONE     [place] [facility]     [address]     [place], [place]  [address]           ENDOSCOPY PROCEDURE REPORT           PATIENT:  Emily [name], Emily [name]  MR#:  [id number]     BIRTHDATE:  October 24, 1940, 68 yrs. old  GENDER:  female           ENDOSCOPIST:  Barbette Hair. Arlyce Dice, MD     ASSISTANT:           PROCEDURE DATE:  05/22/2009     PROCEDURE:  EGD with balloon dilatation     ASA CLASS:  Class II     INDICATIONS:  1) nausea and vomiting           MEDICATIONS:   Fentanyl 70 mcg IV, Versed 7 mg IV, glycopyrrolate     (Robinal) 0.2 mg IV     TOPICAL ANESTHETIC:  Cetacaine Spray           DESCRIPTION OF PROCEDURE:   After the risks benefits and     alternatives of the procedure were thoroughly explained, informed     consent was obtained.  The [id number] ([id number]) endoscope was     introduced through the mouth and advanced to the proximal jejunum,     without limitations.  The instrument was slowly withdrawn as the     mucosa was carefully examined.           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 Area of stenosis just beyond     gastroenteric anastamosis. Scope passes through with minimal     resistance.  The examination was otherwise normal.    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     RECOMMENDATIONS:     1) begin feeding today           REPEAT EXAM:  No        ______________________________     Barbette Hair. Arlyce Dice, MD           CC:  Avel Peace, M.D.           n.     eSIGNED:   Barbette Hair. Kaplan at 05/22/2009 11:51 AM           Emily [name], [id number]  Note: An exclamation mark (!) indicates a result that was not dispersed into the flowsheet. Document Creation Date: 05/22/2009 11:51 AM _______________________________________________________________________  (1) Order result status: Final Collection or observation date-time: 05/22/2009  11:45 Requested date-time:  Receipt date-time:  Reported date-time:  Referring Physician:   Ordering Physician: Melvia Heaps [phone]) Specimen Source:  Source: [name] Order Number: [id number] Lab site:
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
## 2010-06-03 NOTE — Letter (Signed)
Summary: Patient [facility] Biopsy Results  [facility]  [address] [place], [place] [address]   Phone: [phone]  Fax: [phone]        July 08, 2009 MRN: [id number]    Emily [name] [address] [place], [place]  [address]    Dear Ms. Shivley,  I am pleased to inform you that the biopsies taken during your recent endoscopic examination did not show any evidence of cancer upon pathologic examination.The tissue from Your esophagus, stomach and duodenum shows inflammation  Additional information/recommendations:  __No further action is needed at this time.  Please follow-up with      your primary care physician for your other healthcare needs.  _x_ Please call [phone] to schedule a return visit to review      your condition.  _x_ Continue with the treatment plan as outlined on the day of your      exam.  _   Please call us if you are having persistent problems or have questions about your condition that have not been fully answered at this time.  Sincerely,  Hart Carwin MD  This letter has been electronically signed by your physician.  Appended Document: Patient Notice-Endo Biopsy Results Letter mailed 3.10.11

## 2010-06-03 NOTE — Progress Notes (Signed)
Summary: Medication   Phone Note From Pharmacy   Caller: [facility] Delice Bison  [phone] Call For: Dr. Juanda Chance  Summary of Call: Pt just had her Nexium refilled on 06-20-09 and received a prescription for Prevacid today. Wants to make sure it has been a change Initial call taken by: Karna Christmas,  June 24, 2009 3:28 PM  Follow-up for Phone Call        Nexium was D/C'd and she needs to be on Prevacid Solu-tabs. Delice Bison advised. Follow-up by: Laureen Ochs LPN,  June 24, 2009 3:35 PM

## 2010-06-03 NOTE — Procedures (Signed)
Summary: Upper Endoscopy  Patient: Emily [name] Note: All result statuses are Final unless otherwise noted.  Tests: (1) Upper Endoscopy (EGD)   EGD Upper Endoscopy       DONE     [facility]     [address] [place], [place]  [address]           ENDOSCOPY PROCEDURE REPORT           PATIENT:  Emily [name], Emily [name]  MR#:  [id number]     BIRTHDATE:  1941/03/21, 69 yrs. old  GENDER:  female           ENDOSCOPIST:  Hedwig Morton. Juanda Chance, MD     Referred by:  Madaline Savage ID-Din,.           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  The  endoscope was introduced through the     mouth and advanced to the second portion of the duodenum, without     limitations.  The instrument was slowly withdrawn as the mucosa     was fully examined.     <<PROCEDUREIMAGES>>           Esophagitis was found in the total esophagus (see image1, image2,     image11, image13, and image12). severe acute and chronic     esophagitis, thickened esophagus lumen small but admitted the     scope, decreased peristalsis, free fexlux noted, Savary dilation     over a guidewire 16 mm Savary dil passed, there was small amount     of blood on the dilator  pyloric stenosis (see image9, image8,     image7, image6, and image5). pylorospasm, able to advance into     duodenum which appears normal  Post-operative change was noted.  gastrojejunostomy at 60 cm, pylorus at 65 cm,  other findings (see     image10). gastrostomy at 55 cm, functioning PEG    Retroflexed     views revealed no abnormalities.    The scope was then withdrawn     from the patient and the procedure completed.           COMPLICATIONS:  None           ENDOSCOPIC IMPRESSION:     1) Esophagitis in the total esophagus     2) Pyloric stenosis     3) Post-operative change     4) Other findings     s/p passage of 16 mm dilator through narrow thick esophagus, her     symptoms of "lump in her throat" are likely related to     LPR/inflamation and edema     RECOMMENDATIONS:  she is already on maximum medical Rx with Carafate, PPI's bid,     antoreflux measures, decompressive PEG           REPEAT EXAM:  In 0 year(s) for.           ______________________________     Hedwig Morton. Juanda Chance, MD           CC:           n.     eSIGNED:   Hedwig Morton. Brodie at 01/10/2010 03:26 PM           [name], [id number]  Note: An exclamation mark (!) indicates a result that was not dispersed into the flowsheet. Document Creation Date: 01/10/2010 3:27 PM _______________________________________________________________________  (1) Order result status: Final Collection or observation date-time: 01/10/2010 15:11 Requested date-time:  Receipt date-time:  Reported date-time:  Referring Physician:   Ordering Physician: [name] [phone]) Specimen Source:  Source: [name] Order Number: [id number] Lab site:   Appended Document: Orders Update    Clinical Lists Changes  Orders: Added new Test order of [facility]) - Signed

## 2010-06-03 NOTE — Procedures (Signed)
Summary: Upper Endoscopy w/PEG  Patient: Emily [name] Note: All result statuses are Final unless otherwise noted.  Tests: (1) Upper Endoscopy w/PEG (UEP)  UEP Upper Endoscopy w/PEG                             DONE     [facility]     [address]     [place], [place]  [address]           OPERATIVE PROCEDURE REPORT           PATIENT:  Carlyon, Nolasco  MR#:  [id number]     BIRTHDATE:  02-13-1941, 68 yrs. old  GENDER:  female           ENDOSCOPIST:  Barbette Hair. Arlyce Dice, MD     ASSISTANT:           PROCEDURE DATE:  05/24/2009     PROCEDURE:  EGD with PEG placement     ASA CLASS:  Class II     INDICATIONS:  1) nausea and vomiting Chronic nausea and vomiting.     PEG placed for gastric decompression           MEDICATIONS:   Fentanyl 75 mcg IV, Versed 5 mg IV     TOPICAL ANESTHETIC:  Cetacaine Spray           DESCRIPTION OF PROCEDURE:  After the risks benefits and     alternatives of the procedure were thoroughly explained, informed     consent was obtained.  The EG-[id number] ([id number]) endoscope was     introduced through the mouth and advanced to the proximal jejunum,     without limitations.  The instrument was slowly withdrawn as the     mucosa was fully examined.           Multiple erosions were found (see image001). Multiple erosions     along lesser curvature corresponding to the NG tube  A stricture     was found at the pylorus. Pyloric stenosis; unable to pass scope     Anastomosis was noted. Widely patent gastrojejunostomy. Scope     easily traverses previously dilated segment of proximal jejunum.     The remainder of the examination was normal.     <<PROCEDUREIMAGES>>           The stomach was then inflated with air, and by a combination of     transillumination and manual palpation, the site for the     gastrostomy tube placement was selected and marked on the anterior     abdominal wall.  The skin of the anterior abdomen was surgically     prepped  and draped with sterile towels.           Utilizing strict sterile technique, the selected site was then     anesthetized with 1% xylocaine by injection into the skin and     subcutaneous tissue.  A 1 cm incision was made through the skin     and subcutaneous tissue, and the needle/cannula assembly was then     passed through the abdominal wall and through the anterior wall of     the stomach, maintaining visualization with the endoscope.  A     snare device previously placed through the instrument channel was     then opened and placed around the cannula, the needle was removed,     and the  insertion wire was passed through the cannula and into the     stomach lumen.  The snare was then loosened from the cannula, and     repositioned to snare the insertion wire.  The snare was then     pulled up to the endoscope distal tip, and the scope was then     withdrawn bringing with it the snare and insertion wire.           The insertion wire was then released from the snare, and then     loop-attached to the [employer] Pull PEG gastrostomy     tube. Using the "pull technique", the G-tube was then pulled into     place by traction on the insertion wire at the abdominal wall end.     The G-tube insertion site was then cleansed once again, and the     external bolster was placed over the tube to secure it to the     abdominal wall.  A sterile dressing was then applied, and the     procedure terminated.           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 Arlyce Dice, MD           CC:  Avel Peace, M.D.           n.     eSIGNED:   Barbette Hair. Kaplan at 05/24/2009  11:17 AM           [name], [id number]  Note: An exclamation mark (!) indicates a result that was not dispersed into the flowsheet. Document Creation Date: 05/24/2009 11:21 AM _______________________________________________________________________  (1) Order result status: Final Collection or observation date-time: 05/24/2009 11:11 Requested date-time:  Receipt date-time:  Reported date-time:  Referring Physician:   Ordering Physician: Melvia Heaps [phone]) Specimen Source:  Source: [name] Order Number: [id number] Lab site:

## 2010-06-03 NOTE — Progress Notes (Signed)
Summary: Nausea ? K+  Medications Added PROMETHAZINE HCL 12.5 MG TABS (PROMETHAZINE HCL) ! tab by mouth two times a day as needed       Phone Note Call from Patient Call back at [facility] Phone [phone]   Caller: Patient Call For: Verdine Grenfell Reason for Call: Talk to Nurse Details for Reason: triage/? rx Summary of Call: pt has RX for Potassium per pharm .Marland KitchenBut Dr Juanda Chance said she NO longer has to take it..Pt is taking Reglan and is still feeling Nauseas Initial call taken by: Guadlupe Spanish [facility],  March 12, 2008 8:31 AM  Follow-up for Phone Call        Ptient called, unsure if she still needs to take her KLor Con 2 times day.  She said she recieved a phone call from you saying not to take K+ but the pharmacy called her and told her her refill was ready.  Patients K+ wnl per labs. Also patient is having nausea after eating, she does take the Reglan 30 minutes before meals but it is not helping,  Would like to know what else she can do.  No other symptoms just nausea.  Please advise. Follow-up by: Paulene Floor, RN,  March 12, 2008 11:18 AM  Additional Follow-up for Phone Call Additional follow up Details #1::        Take one K+/day, stay on liquids for 24 hours so thay her stomach can empty, use Phenerga 12.5 mg regularly  two times a day. Additional Follow-up by: Hart Carwin MD,  March 12, 2008 5:41 PM    Additional Follow-up for Phone Call Additional follow up Details #2::    Lm for patient to return call. Paulene Floor, RN  March 13, 2008 9:05 AM Unable to reach patient, unable to LM. Paulene Floor, RN  March 14, 2008 9:20 AM Spoke with patient she will take K+ 1 daily.  Will send phenergan script to pharmacy.   Follow-up by: Paulene Floor, RN,  March 14, 2008 4:54 PM  New/Updated Medications: PROMETHAZINE HCL 12.5 MG TABS (PROMETHAZINE HCL) ! tab by mouth two times a day as needed   Prescriptions: PROMETHAZINE HCL 12.5 MG TABS (PROMETHAZINE HCL) ! tab by mouth  two times a day as needed  #30 x 0   Entered by:   Paulene Floor, RN   Authorized by:   Hart Carwin MD   Signed by:   Paulene Floor, RN on 03/14/2008   Method used:   Electronically to        [id number] (retail)       [address] [place]  [place]       [place], [place]  [address]       Ph: [phone] or [phone]       Fax: [phone]   RxID:   [id number]

## 2010-06-03 NOTE — Miscellaneous (Signed)
Summary: IRON INFUSION  Clinical Lists Changes  Orders: Added new Test order of Iron Infusion (Iron Inf) - Signed

## 2010-06-03 NOTE — Miscellaneous (Signed)
Summary: Orders/[facility]  Orders/[facility]   Imported By: Lester [place] 07/11/2009 13:33:38  _____________________________________________________________________  External Attachment:    Type:   Image     Comment:   External Document

## 2010-06-03 NOTE — Progress Notes (Signed)
Summary: Pt informed of labs, abd films   ---- Converted from flag ---- ---- 05/21/2008 12:13 PM, Desiray Orchard S Amadea Keagy PA-c wrote: Damien Fusi LET Costella Driskill KNOW HER LABS AND ABDOMINAL FILMS WERE OK.  ----------------------------- Pt informed that labs and abd films ok.  Pt said she was glad and has had some improvement with her stomach hurting .  She did say when she takes the carafate it makes her stomach hurt.  I told her I would talk to Braysen Cloward and get back to her.
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
## 2010-06-03 NOTE — Consult Note (Signed)
Summary: [facility]   Imported By: Lester [place] 03/27/2009 09:20:26  _____________________________________________________________________  External Attachment:    Type:   Image     Comment:   External Document
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
## 2010-06-03 NOTE — Miscellaneous (Signed)
Summary: pantoprazole refills  Clinical Lists Changes  Medications: Rx of PROTONIX 40 MG TBEC (PANTOPRAZOLE SODIUM) one via peg once two times a day;  #60 x 1;  Signed;  Entered by: Lamona Curl CMA (AAMA);  Authorized by: Hart Carwin MD;  Method used: Electronically to [facility] [address], [place], [place]  [id number], Ph: [phone], Fax: [phone]    Prescriptions: PROTONIX 40 MG TBEC (PANTOPRAZOLE SODIUM) one via peg once two times a day  #60 x 1   Entered by:   Lamona Curl CMA (AAMA)   Authorized by:   Hart Carwin MD   Signed by:   Lamona Curl CMA (AAMA) on 01/10/2010   Method used:   Electronically to        [facility] (retail)       [address]       [place], [place]  [address]       Ph: [phone]       Fax: [phone]   RxID:   [id number]   Appended Document: pantoprazole refills    Clinical Lists Changes  Medications: Rx of PROTONIX 40 MG TBEC (PANTOPRAZOLE SODIUM) one via peg once two times a day;  #60 x 1;  Signed;  Entered by: Lamona Curl CMA (AAMA);  Authorized by: Hart Carwin MD;  Method used: Electronically to [facility] [address] [place], [place]  [id number], Ph: [phone], Fax: [phone]    Prescriptions: PROTONIX 40 MG TBEC (PANTOPRAZOLE SODIUM) one via peg once two times a day  #60 x 1   Entered by:   Lamona Curl CMA (AAMA)   Authorized by:   Hart Carwin MD   Signed by:   Lamona Curl CMA (AAMA) on 01/10/2010   Method used:   Electronically to        [facility]* (retail)       [address]       [place], [place]  [address]       Ph: [phone]       Fax: [phone]   RxID:   [id number]

## 2010-06-03 NOTE — Miscellaneous (Signed)
Summary: Potassium Orders  Clinical Lists Changes  Problems: Added new problem of HYPOKALEMIA (ICD-276.8) Orders: Added new Test order of TLB-Potassium (K+) (91478-G) - Signed

## 2010-06-03 NOTE — Miscellaneous (Signed)
Summary: Orders/[facility]  Orders/[facility]   Imported By: Lester [place] 11/19/2009 10:53:40  _____________________________________________________________________  External Attachment:    Type:   Image     Comment:   External Document

## 2010-06-03 NOTE — Letter (Signed)
Summary: Dr Abbey Chatters note  Dr Abbey Chatters note   Imported By: Kassie Mends 03/27/2009 09:59:12  _____________________________________________________________________  External Attachment:    Type:   Image     Comment:   External Document

## 2010-06-03 NOTE — Progress Notes (Signed)
Summary: Triage   Phone Note Call from Patient Call back at Home Phone [phone]   Caller: Patient Call For: Dr. Juanda Chance Reason for Call: Talk to Nurse Summary of Call: Requesting to speak directly to nurse Initial call taken by: Karna Christmas,  March 03, 2010 12:16 PM  Follow-up for Phone Call        Patient called to let us know she went to the ER on Saturday for nausea, vomiting and diarrhea. Patient states the ER nurse told her to call Dr. Juanda Chance and let her know that her potassium was low when she was there and that she should be followed up here. Patient states that when she was d/c'ed from the hospital last week, the order for IV fluids and potassium were not renewed with home care. She states her diarrhea is not as bad as it was but still has nausea and vomiting.Please, advise. Follow-up by: Jesse Fall RN,  March 03, 2010 3:17 PM  Additional Follow-up for Phone Call Additional follow up Details #1::        She ought to take KCL elixir 20 mEq/tblsp via jejunostomy feeding tube  three times a day x 1 week., # 12 oz, 1 refill, recheck K+  1 week Additional Follow-up by: Hart Carwin MD,  March 03, 2010 5:22 PM     Appended Document: Triage    Clinical Lists Changes  Medications: Added new medication of POTASSIUM CHLORIDE 20 MEQ/15ML (10%) LIQD (POTASSIUM CHLORIDE) Take 20 meq (Tablespoon) via jejunostomy feeding tube three times a day for one week. - Signed Rx of POTASSIUM CHLORIDE 20 MEQ/15ML (10%) LIQD (POTASSIUM CHLORIDE) Take 20 meq (Tablespoon) via jejunostomy feeding tube three times a day for one week.;  #12 oz x 1;  Signed;  Entered by: Jesse Fall RN;  Authorized by: Hart Carwin MD;  Method used: Electronically to [facility] [address], [place], [place]  [id number], Ph: [phone], Fax: [phone]    Prescriptions: POTASSIUM CHLORIDE 20 MEQ/15ML (10%) LIQD (POTASSIUM CHLORIDE) Take 20 meq (Tablespoon) via jejunostomy feeding  tube three times a day for one week.  #12 oz x 1   Entered by:   Jesse Fall RN   Authorized by:   Hart Carwin MD   Signed by:   Jesse Fall RN on 03/04/2010   Method used:   Electronically to        [facility]* (retail)       [address]       [place], [place]  [id number]       Ph: [phone]       Fax: [phone]   RxID:   [id number]    Appended Document: Triage Patient notified of Dr. Regino Schultze recommendations. Rx sent to [facility]. Patient will take Potassium elixir three times a day for a week and come for labs in one week. Patient will call back for futher probelms
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
## 2010-06-03 NOTE — Progress Notes (Signed)
Summary: Call [facility]   Phone Note Other Incoming   Caller: [facility] [phone] [facility] Initial call taken by: Leanor Kail [facility],  July 04, 2009 10:06 AM  Follow-up for Phone Call        [place] with [facility] called to advise [place] that patient and her family refused 24 hour TPN last night. I actually had a conversation earlier today with Ms.Windsor' daughter at which time she expressed that she was extremely confused about the whole situation and wanted to speak to Dr Juanda Chance. I will let [name] know a decision about the TPN after the daughter and Dr Juanda Chance discuss. [name] would also like to know if they are to do TPN and the J Tube feedings on the patient or just the TPN. Follow-up by: Hortense Ramal CMA Duncan Dull),  July 04, 2009 10:45 AM  Additional Follow-up for Phone Call Additional follow up Details #1::        Discussed at length with Cindy,RN. Pt is for EGD this afternoon. I will be talking to the daughter. I think the best thinG for the pt is to bring her to a skilled NH facility for 21 days (has Medicare PartA), and try to give her daughter a break and at the same time take care of her jejunal TF's and cTNA Additional Follow-up by: Hart Carwin MD,  July 04, 2009 1:17 PM    Additional Follow-up for Phone Call Additional follow up Details #2::    I have called and left a message for Schuyler Amor, social worker with [facility] to call back. Per Dr Juanda Chance, patient needs to be in a skilled nursing facility x 21 days for help with her Jejunostomy, TPN, and PICC. Per [name] with [facility], it is normally much quicker to admit a Patient and get a hospital social worker on the case. I will do my best to prevent [place] from having to do this.Marland KitchenMarland KitchenMarland KitchenI have also spoken to [facility] and have advised them per Dr Juanda Chance, to begin TPN tommorrow AM and to also add protonix 40 mg IV once daily. Hortense Ramal CMA Duncan Dull)  July 04, 2009 5:00 PM   I am currently  in the process of trying to find placement to a skilled nursing facility for Ms. Delauter. I am awaiting return phone calls from [facility] and [facility]. [facility] states that they do not care for TPN's. However, [facility] states that medicare will not cover them to come into a skilled nursing facility. I will continue to research the issue. Hortense Ramal CMA Duncan Dull)  July 05, 2009 12:55 PM   [facility] as well as [facility] are all filled to capacity. I have called and left messages at [facility], [address] and [facility] and [facility] but have not gotten any return call. I have also called [facility] which does not care for patient's who have TPN. I have discussed this with Dr Juanda Chance, and she will most likely need to admit the patient in order for a hospital social worker to get on the case. Hortense Ramal CMA Duncan Dull)  July 08, 2009 10:09 AM   Additional Follow-up for Phone Call Additional follow up Details #3:: Details for Additional Follow-up Action Taken: I agree. We will go ahead and admit. DB Additional Follow-up by: Hart Carwin MD,  July 08, 2009 12:33 PM   Appended Document: Call [facility] Patient will be admitted to room 1336 at [facility] today. I have personally taken all orders to admitting.
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
## 2010-06-03 NOTE — Procedures (Signed)
Summary: Gastroenterology EGD  Gastroenterology EGD   Imported By: Hortense Ramal CMA 10/07/2007 16:35:36  _____________________________________________________________________  External Attachment:    Type:   Image     Comment:   External Document

## 2010-06-03 NOTE — Letter (Signed)
Summary: External Correspondence-EGD [facility])  External Correspondence-EGD [facility])   Imported By: Hortense Ramal CMA 09/06/2008 10:38:54  _____________________________________________________________________  External Attachment:    Type:   Image     Comment:   External Document

## 2010-06-03 NOTE — Procedures (Signed)
Summary: Upper Endoscopy  Patient: Emily [name] Emily [name] [name] Note: All result statuses are Final unless otherwise noted.  Tests: (1) Upper Endoscopy (EGD)   EGD Upper Endoscopy       DONE (C)     [facility]     [address]     [place], [place]  [address]           ENDOSCOPY PROCEDURE REPORT           PATIENT:  Emily [name], Emily [name] [name]  MR#:  [id number]     BIRTHDATE:  1941-04-27, 69 yrs. old  GENDER:  female           ENDOSCOPIST:  Emily [name] Morton. Juanda Chance, MD     Referred by:  Emily [name] Emily [name] [name], M.D.           PROCEDURE DATE:  07/04/2009     PROCEDURE:  EGD with biopsy     ASA CLASS:  Class III     INDICATIONS:  vomiting vomiting bilious material, s/p [name]     gastroplexy, s/p recent gastrojejunostomy     severe gastroparesis, wght loss, FTT,     jejunal TF's, c TNA           MEDICATIONS:   Versed 5 mg, Fentanyl 75 mcg     TOPICAL ANESTHETIC:  Exactacain Spray           DESCRIPTION OF PROCEDURE:   After the risks benefits and     alternatives of the procedure were thoroughly explained, informed     consent was obtained.  The LB [id number] [id number] endoscope was     introduced through the mouth and advanced to the proximal jejunum,     without limitations.  The instrument was slowly withdrawn as the     mucosa was fully examined.     <<PROCEDUREIMAGES>>           Esophagitis was found in the total esophagus. Grade 4 severe     esophagitis, chronic and acute due to free reflux of bile from the     stomach, cobblestone-like mucosa, thickened mucosa of the     esophagus, no peristalsis With standard forceps, a biopsy was     obtained and sent to pathology (see image1 and image15).     gastroenterostomy (see image12, image13, and image14). g-e junction     at 35 cm, G-tube at 45 cm,, gastrojejunal anastomosis at 45 cm     with free influx of bile into the stomach  The duodenal bulb was     normal in appearance, as was the postbulbar duodenum. spastic     pylorus, difficult to traverse due to  spasm but no stricture, able     to advance into descending duodenum With standard forceps, a     biopsy was obtained and sent to pathology (see image5, image6, and     image7).  other findings in the proximal jejunum. patent     gastrojejunal anastomosis 10cm distal from the g-e junction, I was     able to follow it over 20 cm, no obstruction With standard     forceps, a biopsy was obtained and sent to pathology (see image9,     image10, and image11).    Retroflexed views revealed no     abnormalities.    The scope was then withdrawn from the patient     and the procedure completed.           COMPLICATIONS:  None  ENDOSCOPIC IMPRESSION:     1) Esophagitis in the total esophagus     2) Gastroenterostomy     3) Normal duodenum     4) Other findings in the proximal jejunum     severe bile reflux esophagitis, ?functioning gastrojejunostomy,     patent by e3ndoscopic exam     spasm duodenum/pylorus, difficult intubation but no obstruction           I think pt has a severe gastroparesis with free bile reflux, and     severe esophagitis. She is unble to swallow due to severe     esophagitis.     RECOMMENDATIONS:     Carafate slurry 10cc po qid     PPI per cTNA 100cc/24 hrs cyclic     continue jejunal TF's     pt will be admitted to a skilled nursing home facility for 21     dayss.           REPEAT EXAM:  In 0 year(s) for.           ______________________________     Emily [name] Morton. Juanda Chance, MD           CC DR Emily [name] Emily [name] [name], GI division, [facility]           CC:           n.     REVISED:  07/08/2009 04:16 PM     eSIGNED:   Hedwig Morton. Claborn [name] at 07/08/2009 04:16 PM           Page 2 of 3   Emily [name], Emily [name] [name] [place], [id number]  Note: An exclamation mark (!) indicates a result that was not dispersed into the flowsheet. Document Creation Date: 07/08/2009 4:16 PM _______________________________________________________________________  (1) Order result status: Final Collection or observation  date-time: 07/04/2009 16:06 Requested date-time:  Receipt date-time:  Reported date-time:  Referring Physician:   Ordering Physician: Emily [name] [name] [phone]) Specimen Source:  Source: [name] Order Number: [id number] Lab site:

## 2010-06-03 NOTE — Progress Notes (Signed)
Summary: Iron Infusion  Pt. scheduled for Imfed Iron Dextran infusion for 02-02-08 at 8am at [facility].Laureen Ochs LPN  January 25, 2008 8:32 AM  ---- Robbi Garter from flag ---- ---- 01/24/2008 5:18 PM, Hart Carwin MD wrote: Please schedule Imfed iron dextran infusion  if available again, at pharmacy  designated dose. Thanx  ---- 01/24/2008 2:59 PM, Laureen Ochs LPN wrote: Dr.Jolanta Cabeza--I scheduled Ms.Pickup for her iron infusion. You ordered Venofer, but they again have the Imfed Iron Dextran, is that O.K.? Deb ------------------------------
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
## 2010-06-03 NOTE — Progress Notes (Signed)
Summary: TRIAGE   Phone Note Call from Patient Call back at Home Phone [phone]   Caller: Patient Call For: Dr. Juanda Chance Reason for Call: Talk to Nurse Details for Reason: gastroparesis Summary of Call: Pt. would like to know if she needs to make an appt with Dr. Rosalyn Gess gastroparesis.   Follow-up for Phone Call        Pt. saw Dr.Koch at [facility] on 08-16-08. She has Gastroparesis and was put on a liquid diet. Pt. is not satisfied with Dr.Koch, "Because he keeps putting me on these diets, I am on a liquid diet now." "Dr.Hubert Derstine told me if I wasn't satisfied with Dr.Koch I needed to tell her" DR.Lethia Donlon PLEASE ADVISE Follow-up by: Laureen Ochs LPN,  August 21, 2008 11:28 AM  Additional Follow-up for Phone Call Additional follow up Details #1::        Yes, she ought to make an appointment with me. I have dictated a letter to Dr [name] asking him to send me results of his evaluation. I mentioned Ms Deckman having difficulty commuting to [place] for her many appointments. Additional Follow-up by: Hart Carwin,  August 21, 2008 1:25 PM    Additional Follow-up for Phone Call Additional follow up Details #2::    Pt. will see Dr.Melbourne Jakubiak on 09-06-08 at 2pm. Pt. instructed to call back as needed.  Follow-up by: Laureen Ochs LPN,  August 21, 2008 2:01 PM
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
## 2010-06-03 NOTE — Progress Notes (Signed)
Summary: Wants to be referred to [facility]   Phone Note Call from Patient Call back at Home Phone [phone]   Caller: [name] -daughter Call For: Dr Juanda Chance Summary of Call: Would like Dr Juanda Chance to refer her her to [facility].  Initial call taken by: Leanor Kail [facility],  October 17, 2009 12:16 PM  Follow-up for Phone Call        OK. Please refer to the [facility]. Reason: gastroparesis. Dyspagia. Failure to thrive.  Follow-up by: Hart Carwin MD,  October 17, 2009 1:02 PM  Additional Follow-up for Phone Call Additional follow up Details #1::        Referral and multiple notes sent to [facility]. They will call patient with appointment date and time. I have left a message Hawricka, patient's daughter to advise her of the above. Dottie Nelson-Smith CMA Duncan Dull)  October 17, 2009 3:17 PM      Appended Document: Wants to be referred to [facility] We have just received note from [facility] that they will be unable to see patient as they have nothing new to offer her. Please see scanned document in EMR dated 10/21/09.  Appended Document: Wants to be referred to [facility] I cannot locate any note from [facility] dated 10/21/2009  Appended Document: Wants to be referred to [facility] -- 10/22/2009 9:07 AM, Hart Carwin MD wrote: OK so the best thing is to offer them to go back to Dr [name].  I have spoken with [name], patient's daughter and have advised her that [facility] has declined to take Ms [name] on as a patient as she has had a full work up by both Dr Juanda Chance and Dr [name] at [facility]. I have explained that actually, they normally recommend Dr [name] for gastroparesis patients. I have offered to make an appointment with Dr [name] for follow up, but the daughter says she will think about it and call us back if she would like another appointment with him.

## 2010-06-03 NOTE — Letter (Signed)
Summary: CMN for Pump/[facility]  CMN for Pump/[facility]   Imported By: Sherian Rein 11/26/2009 08:53:16  _____________________________________________________________________  External Attachment:    Type:   Image     Comment:   External Document

## 2010-06-03 NOTE — Miscellaneous (Signed)
Summary: Orders/[facility]  Orders/[facility]   Imported By: Lester Monette 03/28/2010 11:48:16  _____________________________________________________________________  External Attachment:    Type:   Image     Comment:   External Document

## 2010-06-03 NOTE — Progress Notes (Signed)
Summary: Triage-Sore throat   Phone Note Call from Patient Call back at [phone]   Caller: Patient Call For: Juanda Chance Reason for Call: Talk to Nurse Summary of Call: Patient wants to speak directly to nurse. Initial call taken by: Tawni Levy,  November 13, 2009 9:54 AM  Follow-up for Phone Call        Pt. c/o painful swallowing durning the night X 3 nights. Some nausea.  "It feels like there is a big old ball of something in my throat." Denies dry mouth/throat.  1) Keep water or juice at the bedside to sip on  2) Use Chloraseptic spray as needed for sore throat 3) If symptoms become worse call back immediately 4) I will call pt., if new orders, after MD reviews      Follow-up by: Laureen Ochs LPN,  November 13, 2009 11:08 AM  Additional Follow-up for Phone Call Additional follow up Details #1::        add antacid ( Mylanta 30cc by mouth q2 hours while awake to"coat" the esophagus. Additional Follow-up by: Hart Carwin MD,  November 13, 2009 4:14 PM    Additional Follow-up for Phone Call Additional follow up Details #2::    Above MD orders reviewed with patient. Pt. instructed to call back as needed.  Follow-up by: Laureen Ochs LPN,  November 13, 2009 4:19 PM

## 2010-06-03 NOTE — Consult Note (Signed)
Summary: Gastroenterology/[facility]  Gastroenterology/[facility]   Imported By: Sherian Rein 10/23/2009 14:47:48  _____________________________________________________________________  External Attachment:    Type:   Image     Comment:   External Document

## 2010-06-03 NOTE — Progress Notes (Signed)
Summary: Triage   Phone Note Call from Patient   Caller: Daughter--Rika  [phone] Call For: Dr. Juanda Chance Reason for Call: Talk to Nurse Summary of Call: pt was released from hosp. last week and would like to discuss her drainage tube Initial call taken by: Karna Christmas,  October 15, 2009 9:00 AM  Follow-up for Phone Call        Pt's daughter states pt. continues with n/v and problems with J/G tube drainage, wants her mom seen ASAP.  Pt. will see Dr.Nyashia Raney on 10-16-09 at 9:45am. Follow-up by: Laureen Ochs LPN,  October 15, 2009 9:28 AM

## 2010-06-03 NOTE — Miscellaneous (Signed)
Summary: Plan of care & treatment/[facility]  Plan of care & treatment/[facility]   Imported By: Sherian Rein 12/24/2009 11:45:34  _____________________________________________________________________  External Attachment:    Type:   Image     Comment:   External Document

## 2010-06-03 NOTE — Progress Notes (Signed)
Summary: results   Phone Note Call from Patient Call back at [phone]   Caller: daughter howrika Call For: Juanda Chance Reason for Call: Lab or Test Results Summary of Call: Daughter would like lab results from yesterday Initial call taken by: Tawni Levy,  June 04, 2009 1:38 PM  Follow-up for Phone Call        Informed daughter of lab results and that she is not dehydrated.  I let daughter know Amy Myrtie Hawk has a call into Dr. Juanda Chance regarding the Lopressor.  We will call them when Dr. Juanda Chance gets back to Amy.  We also changed pt's appt from 3-15 to 06-24-09.  I told daughter to have her Mother here for a 1:15 PM appt.  Follow-up by: Joselyn Glassman,  June 05, 2009 3:21 PM

## 2010-06-03 NOTE — Medication Information (Signed)
Summary: Lansoprazole/[facility]  Lansoprazole/[employer]   Imported By: Sherian Rein 07/17/2009 09:05:15  _____________________________________________________________________  External Attachment:    Type:   Image     Comment:   External Document

## 2010-06-03 NOTE — Progress Notes (Signed)
Summary: speak to DR Juanda Chance   Phone Note Call from Patient Call back at Home Phone [phone]   Caller: Patient Call For: Juanda Chance Reason for Call: Talk to Nurse Summary of Call: Patioent wants to speak directly to nurse regarding something she needs nurse to tell Dr Juanda Chance Initial call taken by: Tawni Levy,  August 17, 2008 9:04 AM  Follow-up for Phone Call        Patient is calling to see if you can find her another doctor.  Her doctor in [place] is putting her back on a liquid diet and she is not sure for how long.  Patient would like you to call her if possible so she can discuss this with you. Follow-up by: Paulene Floor, RN,  August 17, 2008 9:19 AM

## 2010-06-03 NOTE — Miscellaneous (Signed)
Summary: Orders/[facility]  Orders/[facility]   Imported By: Lester Greenbriar 01/22/2010 09:19:07  _____________________________________________________________________  External Attachment:    Type:   Image     Comment:   External Document

## 2010-06-03 NOTE — Progress Notes (Signed)
Summary: LABS   Phone Note From Other Clinic   Caller: [facility]  [phone] Call For: Dr. Juanda Chance Summary of Call: Needs to know what Labs needs to be drawn if you want them to do it. Initial call taken by: Karna Christmas,  June 27, 2009 10:14 AM  Follow-up for Phone Call        Pt. had all requested labs but the VB12 drawn last week. [name] will fax those lab results to [place] and get the VB12 drawn and have that report sent to [place]. Cindy instructed to call back as needed.  Follow-up by: Laureen Ochs LPN,  June 27, 2009 10:24 AM

## 2010-06-03 NOTE — Progress Notes (Signed)
Summary: Surgical Appointment   Phone Note Outgoing Call   Call placed by: Laureen Ochs LPN,  March 08, 2009 4:32 PM Call placed to: Patient Summary of Call: Pt. has an appt. with Dr.Rosenbower at [facility] on 03-13-09 at 10am. Pt. aware of appt. Pt. instructed to call back as needed.  Initial call taken by: Laureen Ochs LPN,  March 08, 2009 4:36 PM

## 2010-06-03 NOTE — Progress Notes (Signed)
Summary: triage   Phone Note Call from Patient Call back at [phone]   Caller: Patient Call For: DR BRODIE Reason for Call: Talk to Nurse Summary of Call: Patient states that the patches that she put behind her ears for nausea has irritated her ear and she has been vomitting for two days. Initial call taken by: Tawni Levy,  December 11, 2009 10:24 AM  Follow-up for Phone Call        Pt states she has a rash behind each ear.  Chenged Trans-drem scop patches on Sunday and noticed the rash on Monday. Pt started vomiting yesterday.  States she has vomited "alot".  States she has been taking her medications as prescribed. Follow-up by: Ashok Cordia RN,  December 11, 2009 11:06 AM  Additional Follow-up for Phone Call Additional follow up Details #1::        As far As I know, the Scopolamine patche can be applied anywhere on her body, not just behind her ears. Please ask her to apply it to her abdomen , away from the PEG and jejunal tube.. Please obtain CBC,renal profile. Additional Follow-up by: Hart Carwin MD,  December 11, 2009 12:57 PM    Additional Follow-up for Phone Call Additional follow up Details #2::    Pt notified re changing placement or patch.  Routine labs were drawn today.  Results to be faxed.  Spoke with nurse from [facility]. Follow-up by: Ashok Cordia RN,  December 11, 2009 2:45 PM

## 2010-06-03 NOTE — Progress Notes (Signed)
Summary: Triage   Phone Note Other Incoming   Caller: [facility]  [phone] Summary of Call: Vomiting brown liquid and nothing is coming out of her G-Tube...no output since Monday Initial call taken by: Karna Christmas,  January 01, 2010 10:26 AM  Follow-up for Phone Call        I spoke with [facility]  Patient has had no drainage from G-tube since Monday.  Was having vomiting once a day , but since g-tube has stopped draining she has multiple episodes of vomiting daily.  Nurse reports drainage is brown and looks like"stool".  She reports no fever, abdominal pain, or other complains.  She is having BM, though they are small.  I have paged Dr Juanda Chance to review. Follow-up by: Darcey Nora RN, CGRN,  January 01, 2010 10:45 AM  Additional Follow-up for Phone Call Additional follow up Details #1::        CBC, BMET today .  Per Dr Juanda Chance they need to try and reposition the G_tube, flush it as needed.  She is to remain on her phenergan, Nexium, carafate qid, and scopalamine patch.  Cindy aware of Dr Regino Schultze orders. Additional Follow-up by: Darcey Nora RN, CGRN,  January 01, 2010 11:04 AM

## 2010-06-03 NOTE — Discharge Summary (Signed)
Summary: D/C Summary 05-10-08   NAME:  Emily [name], Emily [name]              ACCOUNT NO.:  [id number]      MEDICAL RECORD NO.:  [id number]          PATIENT TYPE:  INP      LOCATION:  3311                         FACILITY:  [facility]      PHYSICIAN:  Manning Charity, MD     DATE OF BIRTH:  15-Dec-1940      DATE OF ADMISSION:  05/08/2008   DATE OF DISCHARGE:  05/10/2008                                  DISCHARGE SUMMARY      DISCHARGE DIAGNOSES:   1. 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   2. Gastroesophageal reflux disease.   3. 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   4. Esophageal stricture status post Nissen procedure in 1998, which       failed and required a secondary Belsey IV Procedure in 2008, with       residual esophageal dysmotility.   5. Irritable bowel syndrome.   6. Hypertension.   7. Gastritis and esophagitis via EGD in September 2009.   8. Iron deficiency anemia.   9. History of diverticulosis and internal hemorrhoids seen in December       2008 colonoscopy.   10.History of depression.   11.History of noncardiac chest pain with normal coronaries and       ejection fraction of 50-65% based on cardiac catheterization in       December 2009.   12.Hypokalemia on this admission, resolved.      DISCHARGE MEDICATIONS:   1. Carafate 1 g p.o. b.i.d.   2. Nexium 40 mg p.o. b.i.d.   3. Reglan 10 mg p.o. b.i.d.   4. Metformin 500 mg p.o. daily.   5. Potassium chloride 20 mEq p.o. daily.   6. Diovan 80 mg p.o. daily.   7. Phenergan 12.5 mg as previously directed.   8. Ultram 50 mg as previously directed.      DISPOSITION AND FOLLOWUP:  The patient was discharged home in stable   condition with no complaints of nausea, vomiting, or gastrointestinal   bleeding noted.  The patient's hemoglobin remained steady for greater   than 24 hours by day of discharge.  The patient is to follow up with Dr.   Claire Shown at the [facility] on   February 18, at 8 o'clock in the morning.  She is to also follow up with   her primary care physician at the [facility] on [address] at her earliest convenience.  The patient to call for an   appointment.  At that time, a repeat CBC should be drawn and hemoglobin   level assessed.  Additionally, the result of the patient's TSH and blood   cultures should be followed up on.      PROCEDURES PERFORMED:  An EGD was performed on May 09, 2008, which   showed ulcerative esophagitis with scarring and stricturing distally.   There was a small hiatal hernia noted.  Gastric and duodenal lining  appeared normal.  There was no active bleeding noted.      CONSULTATIONS:  Dr. Yancey Flemings from [facility].      BRIEF ADMITTING HISTORY AND PHYSICAL:  Emily [name] is a 70 year old   African [name] with past medical history significant for   esophageal ulcer by endoscopy in September 2009, esophageal stricture   status post anastomosis and dilatation in September 2009, diverticulosis   and internal hemorrhoids by colonoscopy in 2008, reflux disease, severe   gastroparesis secondary to diabetes mellitus, iron deficiency anemia,   who presents to the [facility] Room for complaints of nausea,   vomiting, and mild abdominal pain.  The patient states that she was in   her usual state of health until 3 days prior to admission, when she   drank about 2 ounces of vodka.  She states that she hardly ever drinks   alcohol.  By the next morning, she started having increased nausea and   malaise.  By that night, she had vomiting of dark coffee-ground material   approximately 3 or 4 episodes total.  One day prior to admission, she   noticed that her bowel movement was dark black in color.  States that   this has happened before for several months now but never as dark as the   one that she experienced just  prior to admission.  The patient also   complained of mild abdominal pain and subjective chills in the days   leading up to her admission.  She denies any diarrhea, any bright red   blood per rectum, any unusual food or travel.  She also denies any chest   pain, shortness of breath, palpitations, hematemesis, numbness or   weakness, dysuria or hematuria.  The patient says she tends to have   nausea at baseline and mild dysphagia secondary to her gastroparesis and   esophageal dysmotility but has been able to tolerate small meals until   about 2 days prior to admission, when she states that she had increased   nausea with food.  States that she is compliant with all her medications   at home including her proton pump inhibitor.      PHYSICAL EXAMINATION:  VITAL SIGNS:  On admission, included a   temperature of 97.2, blood pressure of 143/91, heart rate of 112,   respiratory rate of 20, oxygen saturation of 98% on room air.   GENERAL:  She was in no acute distress.   EYE:  Extraocular motions to be intact with pupils equal, round,   reactive to light and anicteric sclerae.   ENT:  Showed oropharynx and oral cavity to be clear and somewhat dry   mucous membranes.   NECK:  Supple with no lymphadenopathy.   RESPIRATORY:  Clear to auscultation bilaterally.   CARDIOVASCULAR:  Regular rate and rhythm with no murmurs, rubs, or   gallops.   GI:  Positive bowel sounds and a soft nondistended abdomen.  There was   mild tenderness to palpation in the right upper quadrant and left upper   quadrant but no rebound and guarding.   EXTREMITIES:  No clubbing, cyanosis, or edema.   SKIN:  No jaundice.   NEURO:  The patient to be alert and oriented x3.  Cranial nerves II   through XII were intact and was otherwise nonfocal.   PSYCH:  Appropriate.      LABORATORY DATA:  CBC was remarkable for white blood cell count of 5.6,  hemoglobin of 11.8, platelets 289, ANC of 3.1, MCV of 77.  Sodium 138,    potassium 3.6, chloride 102, bicarb 29, BUN 11, creatinine 0.65, glucose   113, total bilirubin 0.8, alk phos 61, AST 23, ALT 19, total protein   6.9, albumin 4.0, calcium 9.4.  The patient was FOBT positive.  PT was   13.8, INR 1.0, PTT was 32, lactic acid was 1.6, lipase was 25, magnesium   1.9.  Serum alcohol was less than 5.  Point of care cardiac markers were   negative.  Hemoglobin A1c was 4.8.      HOSPITAL COURSE BY PROBLEM:   1. Nausea, vomiting, and GI bleed.  Initially, the patient was typed       and screened 2 units of packed RBCs with two large port IVs placed       along with addition of a proton pump inhibitor drip.  The patient's       hemoglobin was first assessed to be at her baseline and in fact was       a little above her baseline of 10.4 during admission.       Gastroenterology was consulted, and she underwent a capital EGD the       following morning.  The procedure was remarkable for findings       including a ulcerative esophagitis and a small peptic stricture       with no active bleeding noted.  Review of old records revealed that       the patient has had recurrent hospital visits for upper GI bleed,       and it was thought that her current episode was brought upon by the       patient's recent use of alcohol.  The patient was counseled not to       induce any further aggravation of her known esophagitis and was       instructed to stay away from all alcohol use.  The patient admitted       that she was only taking her Reglan on an as-needed basis, which       was essentially no more than once per night.  The patient was       instructed that she would have benefit from decreased nausea and       abdominal discomfort if she increased her Reglan dose to a standing       twice daily administration.  The patient was agreeable to this,       although did state that if she takes too much of the Reglan, she       feels lethargic but was willing to try this at home  upon discharge.       During her hospitalization, the patient did not have any further       episodes of coffee-ground emesis and her abdominal pain resolved.       The patient was able to tolerate p.o. intake of solids and liquids       well by day of discharge, and her hemoglobin remained stable by       that time as well.  The patient was started on Carafate per       Gastroenterology recommendations for her esophagitis upon       discharge, and she will be followed up closely on an outpatient       basis by both her primary care provider as well as her regular  GI       physician, Dr. Juanda Chance.  Other causes of her nausea, vomiting, and       abdominal discomfort were ruled out including acute coronary       syndrome as the patient had 3 negative sets of cardiac enzymes upon       admission and was noted to have a normal cardiac catheterization       approximately 1 month prior.  A lipase was checked and this was       found to be normal.  The patient is status post cholecystectomy,       and her liver function tests were within normal limits during       admission indicating no hepatic or gallbladder etiology for her       symptoms.  Bowel ischemia was also ruled out with a normal lactic       acid level.  Serum ethanol level was less than 5 and a urine drug       screen was also found to be negative during this admission.  The       patient had a blood culture, 1 out of 1 that initially returned       positive for Gram-positive cocci in clusters.  This was most likely       a contamination and unlikely to have been an infectious source for       the patient's symptoms.  A repeat set of blood cultures was taken       upon discharge, however, and this will need to be followed up on by       her primary care provider on an outpatient basis.  The patient had       no other signs or symptoms of infection during hospitalization and       remained afebrile with no leukocytosis noted by day of  discharge.   2. Diabetes type 2.  Initially, on admission, the patient's metformin       was held and a sliding scale insulin was started.  The patient's       CBGs were within range during her hospitalization, and she was       restarted on her home metformin dose upon discharge.  Since her       diabetes was complicated by severe gastroparesis, and she was only       taking her Reglan as needed, she was instructed upon discharge to       increase her Reglan use to twice a day.  This was an effort to aid       her recurrent nausea and vomiting, and the patient was agreeable to       this suggestion.  Per recommendations from GI, the patient was also       started on twice daily dosing of Carafate as well upon discharge.   3. Hypokalemia.  The patient admitted to not taking her home potassium       supplementations for at least 2-3 days prior to this admission, and       this was thought to have contributed to her low potassium level of       3.1.  In addition, her recurrent nausea and vomiting was also most       likely contributing to this as well.  The patient was repleted with       oral K-Dur, and her potassium was within normal limits at 4.3 by  day of discharge.  Of note, the patient's magnesium level was       checked and found to be within normal limits at 1.9 during this       admission as well.   4. Tachycardia.  On the day of discharge, the patient was noted to       have a heart rate on the monitor of 120.  The patient had no       complaints other than a feeling of feeling hot, which resolved       after several minutes.  The patient's blood pressure remained       normal and she was afebrile at that time.  The patient denied any       chest pain, shortness of breath, or palpitations.  A TSH was       checked as none could be found on the chart.  Unfortunately, this       was still pending by day of discharge and will need to be followed       up on, although it is  unlikely that she would have an acute thyroid       dysfunction.  The patient had a set of orthostatic vital signs       checked as well and this was found to be normal.  An EKG was       obtained, which showed sinus tachycardia and was otherwise       consistent with the description given from the patient's office       visit to her cardiologist in November 2009.  Also, the patient was       noted to have 3 sets of negative cardiac enzymes during her       admission as well as a normal cardiac catheterization approximately       1 month prior with clean coronary arteries.  Therefore, the       etiology of her tachycardia was unlikely to be an acute coronary       syndrome, and the patient's primary care provider can follow up on       an outpatient basis and re-refer to her cardiologist as deemed       necessary.      DISCHARGE LABS AND VITALS:  On day of discharge, the patient had a blood   pressure of 146/63, heart rate of 86, temperature of 97.9, respiratory   rate 16, oxygen saturation 99% on room air.  Sodium was 140, potassium   4.3, chloride 108, bicarb 28, BUN 3, creatinine 0.57, glucose 120, white   blood cell count 6.3, hemoglobin 11.1, MCV 79.0, platelets 289,   magnesium 1.9.               Lucy Antigua, MD                  Manning Charity, MD            RK/MEDQ  D:  05/10/2008  T:  05/11/2008  Job:  [id number]      cc:   Wilhemina Bonito. Marina Goodell, MD   Dr. Emeline Darling   Dr. Claire Shown

## 2010-06-05 NOTE — Progress Notes (Signed)
Summary: Doesnt feel goo   Phone Note Call from Patient Call back at Home Phone [phone]   Call For: Dr Juanda Chance Reason for Call: Talk to Nurse Summary of Call: Doesnt feel too good today. Would like to speak to nurse. Initial call taken by: Leanor Kail [facility],  May 13, 2010 10:04 AM  Follow-up for Phone Call        Patient calling to see about the IV and by mouth nausea medications. She states she still needs to take the by mouth meds even with the IV meds. She wanted [place] to know this.  Also, patient states she went to see the surgeon at [facility]. He is going to take out part of her stomach so she can still eat. She is to go back to see him on 05/21/10.  Follow-up by: Jesse Fall RN,  May 13, 2010 10:35 AM  Additional Follow-up for Phone Call Additional follow up Details #1::        I will wait for his note. Additional Follow-up by: Hart Carwin MD,  May 13, 2010 1:32 PM

## 2010-06-05 NOTE — Progress Notes (Signed)
Summary: advise pt   Phone Note Call from Patient Call back at Home Phone [phone]   Caller: Patient Call For: Dr. Juanda Chance Reason for Call: Talk to Nurse Summary of Call: would like to discuss course of action since hospital d/c... does she still need to come in and see Dr. Juanda Chance in the office? Initial call taken by: Vallarie Mare,  April 16, 2010 9:39 AM  Follow-up for Phone Call        [facility] @ [facility] [phone].Marland KitchenMarland KitchenMarland KitchenWants to know if they should resume her IV fluids Follow-up by: Karna Christmas,  April 16, 2010 10:32 AM  Additional Follow-up for Phone Call Additional follow up Details #1::        Spoke with patient she was d/c'ed from the hospital yesterday. She wants to know when she should come into the office. Hospital D/C added to EMR. Patient states she had low BP while admitted to the hospital and her "Head is going around today." Also, received a call from [facility] asking if we should resume patient's daily IVF. Please, advise. Additional Follow-up by: Jesse Fall RN,  April 16, 2010 11:29 AM    Additional Follow-up for Phone Call Additional follow up Details #2::    Received a call from Selena Batten a nutritionist with [facility] re: tube feeding for patient. She was wondering if the patient could increase her flushes during the day and not have IVF. Discussed that this may cause vomiting with this patient  but told her I would ask MD about this. Please, advise. Follow-up by: Jesse Fall RN,  April 16, 2010 3:29 PM  Additional Follow-up for Phone Call Additional follow up Details #3:: Details for Additional Follow-up Action Taken: OK to increase the jejunal flush to double up on it. Also reduce the IV fluids to once a week  only ,1000cc NaCl IV via PICC line. We will try and see if it works. Additional Follow-up by: Hart Carwin MD,  April 16, 2010 11:08 PM   Appended Document: advise pt Spoke with Darl Pikes at [facility] re: Dr. Regino Schultze order for IVF NaCl 1000cc weekly.  Faxed orderes to [facility] at [phone] as requested. Spoke with Selena Batten at [facility] re: increasing jejunal flush to double up. She will instruct patient on this. Per Dr. Juanda Chance have patient schedule REV first of the year. Spoke with patient and reviewed Dr. Regino Schultze recommendations with her. Scheduled patient for 05/06/2010 at 2 PM in [facility]. Per patient she has an appointment with the surgeon at [facility] on 05/07/2010. Patient to call if she has any problems prior to her appointment time.  Appended Document: advise pt    Clinical Lists Changes  Medications: Added new medication of * 0.9% NORMAL SALINE 1000 CC 0.9% Normal Saline 1000cc IV once a week via PICC line - Signed Rx of 0.9% NORMAL SALINE 1000 CC 0.9% Normal Saline 1000cc IV once a week via PICC line;  #4 x 3;  Signed;  Entered by: Jesse Fall RN;  Authorized by: Hart Carwin MD;  Method used: Faxed to [facility], [address], [place], [place], [place]  [address], Ph: [phone], Fax: [phone]    Prescriptions: 0.9% NORMAL SALINE 1000 CC 0.9% Normal Saline 1000cc IV once a week via PICC line  #4 x 3   Entered by:   Jesse Fall RN   Authorized by:   Hart Carwin MD   Signed by:   Jesse Fall RN on 04/17/2010   Method  used:   Faxed to ...       [facility] (retail)       [address]       [place], [place]  [address]       Ph: [phone]       Fax: [phone]   RxID:   [id number]

## 2010-06-05 NOTE — Progress Notes (Signed)
Summary: Triage   Phone Note Call from Patient   Caller: [facility] nurse Call For: Dr. Juanda Chance Reason for Call: Talk to Nurse Summary of Call: Norberta Keens [facility] nurse is calling to see if your wants  them to admiister IV fluids to her, she was discharged from [facility] 2 days ago and they have not been given any orders on this issue, you can reach susan @ [phone] Initial call taken by: [place] Johnson,  April 17, 2010 8:34 AM  Follow-up for Phone Call        Already talked with her this AM. See previous note. Follow-up by: Jesse Fall RN,  April 17, 2010 8:55 AM

## 2010-06-05 NOTE — Miscellaneous (Signed)
Summary: Order / [facility]  Order / [facility]   Imported By: Lennie Odor 05/14/2010 15:36:52  _____________________________________________________________________  External Attachment:    Type:   Image     Comment:   External Document

## 2010-06-05 NOTE — Progress Notes (Signed)
Summary: Phenergan   Phone Note From Pharmacy   Caller: [facility] [phone] [facility] Call For: Dr Juanda Chance  Summary of Call: Is patient going to continue Zofran & Phenergan end on 05-10-2010 and pt thinks she should stay on this medication. Initial call taken by: Leanor Kail [facility],  May 12, 2010 10:57 AM  Follow-up for Phone Call        Dr Juanda Chance, I have a prior authorization for zofran (oral tablet) pending on patient. [facility] wants to know if patient should remain on both zofran and phenergan. From what last hospital discharge says, they sent her home with orders for IV phenergan and zofran.... Follow-up by: Lamona Curl CMA Duncan Dull),  May 12, 2010 11:03 AM  Additional Follow-up for Phone Call Additional follow up Details #1::        yes she can take both. and she has been using it all along, it helps some but she still requires frequent hospitalizations. Additional Follow-up by: Hart Carwin MD,  May 12, 2010 8:36 PM    Additional Follow-up for Phone Call Additional follow up Details #2::    We have been giving her oral antiemetics here @ the office. The hospital I think was sending her home on IV entiemetics. Do you want her to just be on oral medication or IV or Both?Dottie Nelson-Smith CMA Duncan Dull),  May 13, 2010 8:07 AM  just oral antiemetics, Thanks Hart Carwin MD,  May 13, 2010 1:15 PM  I have spoken to patient. She states that the only thing that works for her is IV phenergan. She states she has been giving it to herself IV and that one of the home care nurses taught her how to administer it to herself. I then spoke to Dr Juanda Chance. She states that she was never aware that the patient gives herself IV medications and that she would like me to call home health about this. Originally, Dr Clinton Sawyer Phenergan 25 mg IV every 3-4 hours as needed for severe nausea, okay to alternate with zofran. She also okayed Zofran 4 mg IV every 6-8  hours as needed for severe nausea, may alternate with phenergan. However, she would now to know why patient is giving medication to herself. Dottie Nelson-Smith CMA Duncan Dull)  May 13, 2010 2:37 PM   I have spoken to both [place] and Amy, pharmacist at [facility]. They state that the patient has been giving herself IV phenergan and zofran for at least 2 weeks since her hospitalization. Amy states that it is standard practice with home health care that patient is taught to self administer IV medications. I have asked Dr Juanda Chance to contact me to discuss this and come up with a final plan. Dottie Nelson-Smith CMA Duncan Dull)  May 13, 2010 2:38 PM   Additional Follow-up for Phone Call Additional follow up Details #3:: Details for Additional Follow-up Action Taken: I have spoken with Dr Juanda Chance and she states that she does NOT want patient to self administer IV phenergan and zofran. I have relayed this information to [facility] and she has written this information in the patient's chart. I will contact the patient with this information as well. Dottie Nelson-Smith CMA Duncan Dull)  May 13, 2010 2:55 PM   I have spoken to Ms.Gomes and have explained in detail that Dr Juanda Chance is not comfortable with patient's self administering IV medications and that I have discontinued any orders for the  IV zofran and phenergan per Dr Regino Schultze wishes. Patient states "well, I am going to be right back in the hospital." I have explained to patient that we would like her to have only oral zofran and phenergan unless it is being administered by a nurse or in the hospital. Patient verbalizes understanding. She states that she has Zofran and phenergan suppositories but does need the phenergan tablets. I have sent a new [facility] the phenergan tablets. Dottie Nelson-Smith CMA Duncan Dull)  May 13, 2010 3:44 PM   Prescriptions: PROMETHAZINE HCL 12.5 MG TABS (PROMETHAZINE HCL) Take  1 tablet via PEG tube every 4 hours as needed  #30 x 0   Entered by:   Lamona Curl CMA (AAMA)   Authorized by:   Hart Carwin MD   Signed by:   Lamona Curl CMA (AAMA) on 05/13/2010   Method used:   Electronically to        [facility]* (retail)       [address]       [place], [place]  [id number]       Ph: [phone]       Fax: [phone]   RxID:   [id number]

## 2010-06-05 NOTE — Progress Notes (Signed)
   Phone Note Other Incoming Call back at [phone]   Caller: Nyoka Lint, RN Summary of Call: Nyoka Lint, RN with [facility] calling to report that patient is taking Phenergan and Reglan. These drugs have a Level 1 contraindication. (Increase risk of extrapyramidal effects) She has not seen any side effects with Ms. Rochette but wanted to let Dr Juanda Chance know about this interaction and verify it is okay for patient to take both drugs. [name] also states that she had noticed the patient was very sleepy with the IV Phenergan and she was concerned about the pt. falling. She was very pleased that this had been discontinued yesterday afternoon. Please, advise on the Reglan and Phenegan. Initial call taken by: Jesse Fall RN,  May 14, 2010 9:15 AM  Follow-up for Phone Call        we have dealt with that yeasterday, Dottie talked to the Home care people, She should be off IV Phenergan . Follow-up by: Hart Carwin MD,  May 14, 2010 10:02 AM     Appended Document:  Per Dr. Juanda Chance patient needs her medications she is on now.  Appended Document:  Left message for [facility] with Dr. Regino Schultze recommendations.

## 2010-06-05 NOTE — Progress Notes (Signed)
Summary: Triage   Phone Note Call from Patient Call back at Home Phone [phone]   Caller: Patient Call For: Dr. Juanda Chance Reason for Call: Talk to Nurse Summary of Call: vomiting, started with diarrhea yesterday. Diarrhea has stopped but still vomiting. Initial call taken by: Karna Christmas,  April 22, 2010 10:38 AM  Follow-up for Phone Call        Patient called to report she had diarrhea Sat., Sun, and yesterday by has not had any today. States for the last 2-3 days, she has had vomiting. She states any ice or water she drinks comes up. States she had taking her Carafate and it came up yesterday and today. Patient is taking Zofran two times a day. She did take Phenergan earlier today with some relief. Encouraged patient to take the Phenergan every 4 hours as needed.Patient said she is sore across where the tubes are from vomiting. She states  she is thinking she may have to go to the ER.Denies fever. Spoke with Dr. Christella Hartigan re: above. He wants to be sure patient has gastric  tube unplugged, draining into a bag 24 hours a day. Patient states she is doing this.  Follow-up by: Jesse Fall RN,  April 22, 2010 1:49 PM  Additional Follow-up for Phone Call Additional follow up Details #1::        Patient's nurse was at her home and wanted to let us know the PICC line is not working. She cannot flush the line. Patient states she has not been able to flush the line since Saturday. She also states the patient's heart rate is 134.  Dr. Clearence Ped aware. He wants patient to go to IR to have it looked at and replaced if needed. Scheduled with Debbie in IR at [facility] for patient to go 04/23/10 at 3 PM for this.Patient aware. Additional Follow-up by: Jesse Fall RN,  April 22, 2010 1:53 PM    Additional Follow-up for Phone Call Additional follow up Details #2::    Also received a phone message from Darnelle Going nurse  with [facility] that patient was waiting for a call back to see what to  do about vomiting. She stated the patient was vomiting for the 30 minutes that she was at her home. Jesse Fall, RN 04/22/10 12:48 PM Follow-up by: Jesse Fall RN,  April 22, 2010 3:29 PM  Additional Follow-up for Phone Call Additional follow up Details #3:: Details for Additional Follow-up Action Taken: Patient was admitted on 04/23/10 from ER. Additional Follow-up by: Jesse Fall RN,  April 23, 2010 9:48 AM

## 2010-06-05 NOTE — Discharge Summary (Signed)
Summary: Hospital discharge  Emily [name], Emily [name]              ACCOUNT NO.:  [id number]      MEDICAL RECORD NO.:  [id number]          PATIENT TYPE:  INP      LOCATION:  1412                         FACILITY:  [facility]      PHYSICIAN:  Lonia Blood, M.D.       DATE OF BIRTH:  04-25-1941      DATE OF ADMISSION:  04/03/2010   DATE OF DISCHARGE:  04/15/2010                                  DISCHARGE SUMMARY         The patient's primary care physician is at [facility].      DISCHARGE DIAGNOSES:   1. Acute-on-chronic abdominal pain.   2. Gastroparesis exacerbation, unclear why.   3. Constipation, resolved.   4. Chronic normocytic anemia.   5. Diabetes mellitus, type 2.   6. Moderate-to-severe protein-calorie malnutrition.      DISCHARGE MEDICATIONS:   1. Tylenol 650 mg per tube every 6 hours as needed for pain.   2. Elavil 25 mg at bedtime.   3. Celexa 10 mg daily.   4. Hydrocodone/APAP 5/325 one tablet every 6 hours as needed for pain.   5. Jevity 1.2 via the jejunostomy tube 1375 cc to be infused       throughout the night.   6. Ativan 0.5 mg per tube every 6 hours.   7. Reglan 5 mg per tube four times a day.   8. Zofran 4 mg per tube twice a day as needed for nausea.   9. Protonix 40 mg per tube twice a day.   10.MiraLax 17 g daily.   11.Phenergan 25 mg suppository every 4 hours as needed for nausea.   12.Carafate 1 g by mouth 4 times a day.   13.Diclofenac cream as needed.   14.Ambien 5 mg by mouth at bedtime as needed for sleep.      CONDITION ON DISCHARGE:  Emily [name] was discharged in fair condition.   She does have a permanent drainage from the gastrostomy site with a bag   hanging to gravity that continuously drains gastric fluid.  She   otherwise is allowed to eat some ice chips and clear liquids that she   can tolerate and she gets her nutrition through the jejunostomy during   the night.  The patient is scheduled to follow up with [facility] with Dr. [name] expert in gastroparesis   to decide if she requires gastrectomy.  The patient will also follow up   with [facility] and with Dr. [name].      PROCEDURES DURING THIS ADMISSION:  No procedures done.      CONSULTATION:  The patient was seen in consultation by [facility]   Gastroenterology.      HOSPITAL COURSE:  Emily [name] 70 year old woman with known gastroparesis   was admitted with a flare up on April 04, 2010.  No exact cause was   identified in regard to this flare up.  She was noted to be dehydrated   and hypokalemic and this electrolytes were replaced  and fluids were   given.  In fact, Dr. Brendia Sacks was planning to discharge the   patient on April 08, 2010.  On April 09, 2010, Emily [name] said that   she felt worse, she could not exactly quantify why.  Her vitals were the   same.  Symptoms to me looked at baseline.  A gastroenterology   consultation was pursued and the patient received 5 days of empiric   antibiotics for possible diverticulitis.  We have avoided doing a CT   scan of abdomen and pelvis as Emily [name] has had numerous procedures in   the past year, hence we will like to limit the risk of inducing injury   by radiating her too much.  I have counted a total number of 7 CT scans   during the last year.  With symptomatic treatment, the patient had a   gradual improvement.  Main difference was made by the fact that we   resumed the permanent gravitation-based drainage through her gastrostomy   tube.  Today, April 15, 2010, the patient was successfully discharged   home with follow up at [facility]   with [facility] Cardiology.               Lonia Blood, M.D.               SL/MEDQ  D:  04/15/2010  T:  04/16/2010  Job:  [id number]      cc:   Hedwig Morton. Juanda Chance, MD   [address]   [place]   [place] [address]

## 2010-06-05 NOTE — Miscellaneous (Signed)
Summary: SN Order / [facility]  SN Order / [facility]   Imported By: Lennie Odor 05/09/2010 14:11:34  _____________________________________________________________________  External Attachment:    Type:   Image     Comment:   External Document

## 2010-06-05 NOTE — Progress Notes (Signed)
Summary: Triage   Phone Note Call from Patient   Caller: Patient Call For: Dr. Juanda Chance Reason for Call: Talk to Nurse Summary of Call: Been vomiting since this morning and would like to discuss Initial call taken by: Karna Christmas,  May 22, 2010 3:31 PM  Follow-up for Phone Call        Patient calling to report she is "sick again."States she started  vomiting at 2:30 AM and she took a suppository which helped. She had vomiting again at 6 AM and took another suppository. She states she noticed some traces of blood in the emesis at 6 AM. States she vomited about one hour ago and it had what looked to her like "dark blood streaks." Patient cannot tell me how many times she has vomited today. States she is cold but does not have a thermometer to check for fever. Denies diarrhea. She is taking Reglan, Carafate and Zofran. She states she just took a Phenergan pill. Please, advise. Follow-up by: Jesse Fall RN,  May 22, 2010 3:46 PM  Additional Follow-up for Phone Call Additional follow up Details #1::        May use Phenergan every 3 -4 hours supp or by mouth. Additional Follow-up by: Hart Carwin MD,  May 22, 2010 4:58 PM    Additional Follow-up for Phone Call Additional follow up Details #2::    Patient aware of Dr. Regino Schultze recommendations Follow-up by: Jesse Fall RN,  May 22, 2010 5:00 PM

## 2010-06-05 NOTE — Miscellaneous (Signed)
Summary: HA Order / [facility]  HA Order / [facility]   Imported By: Lennie Odor 05/27/2010 09:31:59  _____________________________________________________________________  External Attachment:    Type:   Image     Comment:   External Document

## 2010-06-05 NOTE — Miscellaneous (Signed)
Summary: HA Order / [facility]  HA Order / [facility]   Imported By: Lennie Odor 05/09/2010 14:12:51  _____________________________________________________________________  External Attachment:    Type:   Image     Comment:   External Document

## 2010-06-05 NOTE — Medication Information (Signed)
Summary: Approved/[facility]  Approved/[facility]   Imported By: Lester [place] 05/19/2010 08:24:56  _____________________________________________________________________  External Attachment:    Type:   Image     Comment:   External Document

## 2010-06-05 NOTE — Progress Notes (Signed)
Summary: IV meds question   Phone Note From Other Clinic   Caller: [facility] [phone] Call For: Dr Juanda Chance Summary of Call: IV meds questions Initial call taken by: Leanor Kail [facility],  May 21, 2010 2:30 PM  Follow-up for Phone Call        [facility] with [facility]. She states patient was d/c'ed on 05/20/10. The d/c medications list has Phenergan 12.5 mg IV or IM  every 6 hours as needed nausea and Zofran 8 mg IV every AM as needed. I told [name] that Dr. Juanda Chance did not order this. Patient also had orders for Zofran 4 mg per tube two times a day as needed,Phenergan 25 mg supp. rectally every 4 hours as needed nausea. Also, had NS 0.9 % 1000 cc IV daily. Dr. Juanda Chance, would you like to order the IVF's? I told her you would not order the IV Phenergan or Zofran. Patient did see  Dr. Adriana Simas at [facility] per [place] and has been scheduled for surgery on 06/19/10.Please, advise. Follow-up by: Jesse Fall RN,  May 21, 2010 3:03 PM  Additional Follow-up for Phone Call Additional follow up Details #1::        NaCl 1000cc, for IV use once daily. Additional Follow-up by: Hart Carwin MD,  May 22, 2010 1:14 PM    Additional Follow-up for Phone Call Additional follow up Details #2::    Order called to [facility] at [facility] for NaCL 1000cc once IV daily as per Dr. Juanda Chance Follow-up by: Jesse Fall RN,  May 22, 2010 1:28 PM

## 2010-06-05 NOTE — Medication Information (Signed)
Summary: Prior Auth/[facility]  Prior Auth/[facility]   Imported By: Lester [place] 05/19/2010 08:22:56  _____________________________________________________________________  External Attachment:    Type:   Image     Comment:   External Document

## 2010-06-05 NOTE — Miscellaneous (Signed)
Summary: Certification & Plan of Care / [facility]  Certification & Plan of Care / [facility]   Imported By: Lennie Odor 05/09/2010 14:53:39  _____________________________________________________________________  External Attachment:    Type:   Image     Comment:   External Document

## 2010-06-05 NOTE — Miscellaneous (Signed)
Summary: Order/[facility]  Order/[facility]   Imported By: Lester [place] 05/20/2010 07:31:38  _____________________________________________________________________  External Attachment:    Type:   Image     Comment:   External Document

## 2010-06-06 ENCOUNTER — Encounter: Payer: Self-pay | Admitting: Internal Medicine

## 2010-06-06 ENCOUNTER — Telehealth: Payer: Self-pay | Admitting: Internal Medicine

## 2010-06-06 NOTE — Procedures (Signed)
Summary: Preparation for Endoscopy / [facility]  Preparation for Endoscopy / [facility]   Imported By: Lennie Odor 01/09/2010 11:36:02  _____________________________________________________________________  External Attachment:    Type:   Image     Comment:   External Document

## 2010-06-09 ENCOUNTER — Telehealth: Payer: Self-pay | Admitting: Internal Medicine

## 2010-06-11 NOTE — Letter (Signed)
Summary: [facility]   Imported By: Lamona Curl CMA (AAMA) 06/04/2010 09:10:05  _____________________________________________________________________  External Attachment:    Type:   Image     Comment:   External Document  Appended Document: [facility] labs reviewed are OK

## 2010-06-11 NOTE — Progress Notes (Signed)
Summary: Talk to nurse   Phone Note Call from Patient   Caller: [phone] DAUGHTER -HOWRICA Call For: DR Juanda Chance Reason for Call: Talk to Nurse Initial call taken by: Leanor Kail [facility],  May 29, 2010 2:08 PM  Follow-up for Phone Call        Left message for daughter to call back.  Follow-up by: Jesse Fall RN,  May 29, 2010 2:31 PM  Additional Follow-up for Phone Call Additional follow up Details #1::        Called patient's daughter. She states the patient was feeling bad yesterday- nausea. Her daughter is wondering if home health nurse could come out and check her potassium level. Please, advise. Additional Follow-up by: Jesse Fall RN,  May 30, 2010 4:01 PM    Additional Follow-up for Phone Call Additional follow up Details #2::    yes, please obtain renal profile, and Madnesium level Follow-up by: Hart Carwin MD,  June 01, 2010 7:02 AM   Appended Document: Talk to nurse Spoke with [name] at [facility]. She will go out and draw renal profile and Magnesium level on patient. She states she will go out on 06/03/10 and draw these. Called and let patient know also.

## 2010-06-11 NOTE — Progress Notes (Signed)
Summary: Triage   Phone Note Call from Patient Call back at Home Phone [phone]   Caller: Patient Call For: Dr. Juanda Chance Summary of Call: The top of her J-tube is rubber and it is loose...what does she need to do? Initial call taken by: Karna Christmas,  June 06, 2010 10:05 AM  Follow-up for Phone Call        Spoke with patient. She states she had  new J-tube placed when she was in the hospital and it is rubber. States the top is just about to come off. She wants to know what to do about this. Spoke with Darcey Nora, RN and we do not deal with J tubes. She will need to see surgeon that placed it. Spoke with patient, she states radiology placed her tube. Gave her radiology number to call to get them to advise her. Follow-up by: Jesse Fall RN,  June 06, 2010 10:32 AM  Additional Follow-up for Phone Call Additional follow up Details #1::        please just make sure she is talking  about J-tube and not G-tube ( which we ARE resposible for). Radiology would address the J-tube concerns. We can always mediate the referral to radiology for malfunctioning J-tube. Additional Follow-up by: Hart Carwin MD,  June 06, 2010 2:12 PM    Additional Follow-up for Phone Call Additional follow up Details #2::    Spoke with patient. She went to the radiology department and they gave her a piece to use for the j-tube.  Follow-up by: Jesse Fall RN,  June 06, 2010 2:46 PM

## 2010-06-11 NOTE — Miscellaneous (Signed)
Summary: Orders/[facility]  Orders/[facility]   Imported By: Lester [place] 06/03/2010 08:39:04  _____________________________________________________________________  External Attachment:    Type:   Image     Comment:   External Document

## 2010-06-12 ENCOUNTER — Encounter: Payer: Self-pay | Admitting: Internal Medicine
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
## 2010-06-16 ENCOUNTER — Encounter: Payer: Self-pay | Admitting: Internal Medicine

## 2010-06-16 ENCOUNTER — Ambulatory Visit (INDEPENDENT_AMBULATORY_CARE_PROVIDER_SITE_OTHER): Payer: Medicare Other | Admitting: Internal Medicine

## 2010-06-16 ENCOUNTER — Telehealth: Payer: Self-pay | Admitting: Internal Medicine

## 2010-06-16 DIAGNOSIS — K2100 Gastro-esophageal reflux disease with esophagitis, without bleeding: Secondary | ICD-10-CM

## 2010-06-16 DIAGNOSIS — K3184 Gastroparesis: Secondary | ICD-10-CM

## 2010-06-19 NOTE — Letter (Signed)
Summary: [facility] General Surgery Consult  [facility] General Surgery Consult   Imported By: Lamona Curl CMA (AAMA) 06/12/2010 16:14:06  _____________________________________________________________________  External Attachment:    Type:   Image     Comment:   External Document

## 2010-06-19 NOTE — Progress Notes (Signed)
Summary: triage   Phone Note Call from Patient Call back at Home Phone [phone]   Caller: Patient Call For: Dr. Juanda Chance Reason for Call: Talk to Nurse Summary of Call: Pt has had diarrhea for 3 days and she had tried imodium and pepto, but she is still having diarrhea, when she passes gas some stool comes out Initial call taken by: [place] Johnson,  June 09, 2010 11:24 AM  Follow-up for Phone Call        Patient reporting she has had diarrhea for the last 3-4 days. Reports she has diarrhea 4 or more times/day. Stool are liquid and watery. She took Imodium with some relief and did try Peptobismol yesterday with some relief. C/O chills but she does not have the thermometer to check her temp. She is having cramps and urgency. She woke up the during the night and could not make it to the bathroom. She states when she urinates or passes gas she has stool also. Hemorrhoids are bleeding bright red blood a little when she wipes. Not vomiting much at all. Please, advise. Follow-up by: Jesse Fall RN,  June 09, 2010 1:44 PM  Additional Follow-up for Phone Call Additional follow up Details #1::        decrease Jevity feeding to 1/2 the amount x 3 days, then restart again Additional Follow-up by: Hart Carwin MD,  June 09, 2010 10:36 PM    Additional Follow-up for Phone Call Additional follow up Details #2::    Patient notified of Dr. Regino Schultze recommedation. She will call back if she does not improve. Follow-up by: Jesse Fall RN,  June 10, 2010 8:40 AM

## 2010-06-19 NOTE — Medication Information (Signed)
Summary: Request for Pantoprazole / [facility]  Request for Pantoprazole / [facility]   Imported By: Lennie Odor 06/12/2010 15:27:46  _____________________________________________________________________  External Attachment:    Type:   Image     Comment:   External Document

## 2010-06-19 NOTE — Medication Information (Signed)
Summary: Approval/[facility]  Approval/[facility]   Imported By: Lester [place] 06/13/2010 15:35:37  _____________________________________________________________________  External Attachment:    Type:   Image     Comment:   External Document
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
## 2010-06-19 NOTE — Letter (Signed)
Summary: [facility] General Surgery Consult  [facility] General Surgery Consult   Imported By: Lamona Curl CMA (AAMA) 06/12/2010 16:13:42  _____________________________________________________________________  External Attachment:    Type:   Image     Comment:   External Document

## 2010-06-24 ENCOUNTER — Encounter: Payer: Self-pay | Admitting: Internal Medicine

## 2010-06-25 NOTE — Progress Notes (Signed)
Summary: Triage   Phone Note Other Incoming   Caller: Cindy @ [facility]  [phone] Summary of Call: Wants certification for another 9 weeks for skilled nursing and home health aide Initial call taken by: Karna Christmas,  June 16, 2010 2:29 PM  Follow-up for Phone Call        Spoke with [name] and told her it is okay to recert but the patient is going to have surgery on 06/19/10. She is aware but she needs to have recert. for the next few days. . She will send [place] the forms to sign. Follow-up by: Jesse Fall RN,  June 16, 2010 4:35 PM

## 2010-06-25 NOTE — Miscellaneous (Signed)
Summary: SN Order / [facility]  SN Order / [facility]   Imported By: Lennie Odor 06/19/2010 09:56:04  _____________________________________________________________________  External Attachment:    Type:   Image     Comment:   External Document
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
## 2010-07-01 ENCOUNTER — Encounter: Payer: Self-pay | Admitting: Internal Medicine

## 2010-07-01 NOTE — Miscellaneous (Signed)
Summary: Orders/[facility]  Orders/[facility]   Imported By: Lester [place] 06/27/2010 10:15:16  _____________________________________________________________________  External Attachment:    Type:   Image     Comment:   External Document

## 2010-07-06 ENCOUNTER — Encounter: Payer: Self-pay | Admitting: Internal Medicine

## 2010-07-07 ENCOUNTER — Encounter: Payer: Self-pay | Admitting: Internal Medicine

## 2010-07-10 ENCOUNTER — Encounter: Payer: Self-pay | Admitting: Internal Medicine

## 2010-07-10 NOTE — Miscellaneous (Signed)
Summary: Care Plans/[facility]  Care Plans/[facility]   Imported By: Sherian Rein 07/03/2010 14:47:00  _____________________________________________________________________  External Attachment:    Type:   Image     Comment:   External Document

## 2010-07-10 NOTE — Miscellaneous (Signed)
Summary: Order/[facility]  Order/[facility]   Imported By: Sherian Rein 07/03/2010 11:18:46  _____________________________________________________________________  External Attachment:    Type:   Image     Comment:   External Document

## 2010-07-14 LAB — COMPREHENSIVE METABOLIC PANEL
ALT: 47 U/L — ABNORMAL HIGH (ref 0–35)
ALT: 53 U/L — ABNORMAL HIGH (ref 0–35)
AST: 50 U/L — ABNORMAL HIGH (ref 0–37)
AST: 87 U/L — ABNORMAL HIGH (ref 0–37)
Albumin: 2.7 g/dL — ABNORMAL LOW (ref 3.5–5.2)
Albumin: 2.9 g/dL — ABNORMAL LOW (ref 3.5–5.2)
Alkaline Phosphatase: 81 U/L (ref 39–117)
Alkaline Phosphatase: 84 U/L (ref 39–117)
BUN: 20 mg/dL (ref 6–23)
BUN: 22 mg/dL (ref 6–23)
CO2: 28 mEq/L (ref 19–32)
CO2: 28 mEq/L (ref 19–32)
Calcium: 8.6 mg/dL (ref 8.4–10.5)
Calcium: 8.9 mg/dL (ref 8.4–10.5)
Chloride: 101 mEq/L (ref 96–112)
Chloride: 96 mEq/L (ref 96–112)
Creatinine, Ser: 0.78 mg/dL (ref 0.4–1.2)
Creatinine, Ser: 1.07 mg/dL (ref 0.4–1.2)
GFR calc Af Amer: >60 mL/min (ref >60)
GFR calc Af Amer: >60 mL/min (ref >60)
GFR calc non Af Amer: 51 mL/min — ABNORMAL LOW (ref >60)
GFR calc non Af Amer: >60 mL/min (ref >60)
Glucose, Bld: 115 mg/dL — ABNORMAL HIGH (ref 70–99)
Glucose, Bld: 138 mg/dL — ABNORMAL HIGH (ref 70–99)
Potassium: 2.4 mEq/L — CL (ref 3.5–5.1)
Potassium: 4.1 mEq/L (ref 3.5–5.1)
Sodium: 139 mEq/L (ref 135–145)
Sodium: 139 mEq/L (ref 135–145)
Total Bilirubin: 0.4 mg/dL (ref 0.3–1.2)
Total Bilirubin: 0.6 mg/dL (ref 0.3–1.2)
Total Protein: 5.7 g/dL — ABNORMAL LOW (ref 6.0–8.3)
Total Protein: 6.3 g/dL (ref 6.0–8.3)

## 2010-07-14 LAB — CBC
HCT: 27.6 % — ABNORMAL LOW (ref 36.0–46.0)
HCT: 28.1 % — ABNORMAL LOW (ref 36.0–46.0)
HCT: 29.1 % — ABNORMAL LOW (ref 36.0–46.0)
HCT: 36.4 % (ref 36.0–46.0)
HCT: 38.9 % (ref 36.0–46.0)
Hemoglobin: 11.9 g/dL — ABNORMAL LOW (ref 12.0–15.0)
Hemoglobin: 12.6 g/dL (ref 12.0–15.0)
Hemoglobin: 8.6 g/dL — ABNORMAL LOW (ref 12.0–15.0)
Hemoglobin: 9 g/dL — ABNORMAL LOW (ref 12.0–15.0)
Hemoglobin: 9.1 g/dL — ABNORMAL LOW (ref 12.0–15.0)
MCH: 24.2 pg — ABNORMAL LOW (ref 26.0–34.0)
MCH: 24.5 pg — ABNORMAL LOW (ref 26.0–34.0)
MCH: 24.6 pg — ABNORMAL LOW (ref 26.0–34.0)
MCH: 24.8 pg — ABNORMAL LOW (ref 26.0–34.0)
MCH: 25.1 pg — ABNORMAL LOW (ref 26.0–34.0)
MCHC: 31.2 g/dL (ref 30.0–36.0)
MCHC: 31.6 g/dL (ref 30.0–36.0)
MCHC: 32 g/dL (ref 30.0–36.0)
MCHC: 32.4 g/dL (ref 30.0–36.0)
MCHC: 32.7 g/dL (ref 30.0–36.0)
MCV: 76.4 fL — ABNORMAL LOW (ref 78.0–100.0)
MCV: 76.6 fL — ABNORMAL LOW (ref 78.0–100.0)
MCV: 76.8 fL — ABNORMAL LOW (ref 78.0–100.0)
MCV: 77.7 fL — ABNORMAL LOW (ref 78.0–100.0)
MCV: 77.8 fL — ABNORMAL LOW (ref 78.0–100.0)
Platelets: 372 10*3/uL (ref 150–400)
Platelets: 375 10*3/uL (ref 150–400)
Platelets: 421 10*3/uL — ABNORMAL HIGH (ref 150–400)
Platelets: 487 10*3/uL — ABNORMAL HIGH (ref 150–400)
Platelets: 554 10*3/uL — ABNORMAL HIGH (ref 150–400)
RBC: 3.55 MIL/uL — ABNORMAL LOW (ref 3.87–5.11)
RBC: 3.68 MIL/uL — ABNORMAL LOW (ref 3.87–5.11)
RBC: 3.74 MIL/uL — ABNORMAL LOW (ref 3.87–5.11)
RBC: 4.74 MIL/uL (ref 3.87–5.11)
RBC: 5.08 MIL/uL (ref 3.87–5.11)
RDW: 14 % (ref 11.5–15.5)
RDW: 14 % (ref 11.5–15.5)
RDW: 14 % (ref 11.5–15.5)
RDW: 14 % (ref 11.5–15.5)
RDW: 14.2 % (ref 11.5–15.5)
WBC: 16.3 10*3/uL — ABNORMAL HIGH (ref 4.0–10.5)
WBC: 18.5 10*3/uL — ABNORMAL HIGH (ref 4.0–10.5)
WBC: 6.2 10*3/uL (ref 4.0–10.5)
WBC: 6.2 10*3/uL (ref 4.0–10.5)
WBC: 8.3 10*3/uL (ref 4.0–10.5)

## 2010-07-14 LAB — BASIC METABOLIC PANEL
BUN: 12 mg/dL (ref 6–23)
BUN: 3 mg/dL — ABNORMAL LOW (ref 6–23)
BUN: 7 mg/dL (ref 6–23)
CO2: 29 mEq/L (ref 19–32)
CO2: 30 mEq/L (ref 19–32)
CO2: 30 mEq/L (ref 19–32)
Calcium: 7.8 mg/dL — ABNORMAL LOW (ref 8.4–10.5)
Calcium: 7.9 mg/dL — ABNORMAL LOW (ref 8.4–10.5)
Calcium: 8.2 mg/dL — ABNORMAL LOW (ref 8.4–10.5)
Chloride: 102 mEq/L (ref 96–112)
Chloride: 103 mEq/L (ref 96–112)
Chloride: 107 mEq/L (ref 96–112)
Creatinine, Ser: 0.5 mg/dL (ref 0.4–1.2)
Creatinine, Ser: 0.65 mg/dL (ref 0.4–1.2)
Creatinine, Ser: 0.67 mg/dL (ref 0.4–1.2)
GFR calc Af Amer: >60 mL/min (ref >60)
GFR calc Af Amer: >60 mL/min (ref >60)
GFR calc Af Amer: >60 mL/min (ref >60)
GFR calc non Af Amer: >60 mL/min (ref >60)
GFR calc non Af Amer: >60 mL/min (ref >60)
GFR calc non Af Amer: >60 mL/min (ref >60)
Glucose, Bld: 110 mg/dL — ABNORMAL HIGH (ref 70–99)
Glucose, Bld: 70 mg/dL (ref 70–99)
Glucose, Bld: 91 mg/dL (ref 70–99)
Potassium: 3 mEq/L — ABNORMAL LOW (ref 3.5–5.1)
Potassium: 3.2 mEq/L — ABNORMAL LOW (ref 3.5–5.1)
Potassium: 3.3 mEq/L — ABNORMAL LOW (ref 3.5–5.1)
Sodium: 137 mEq/L (ref 135–145)
Sodium: 138 mEq/L (ref 135–145)
Sodium: 143 mEq/L (ref 135–145)

## 2010-07-14 LAB — GLUCOSE, CAPILLARY
Glucose-Capillary: 107 mg/dL — ABNORMAL HIGH (ref 70–99)
Glucose-Capillary: 109 mg/dL — ABNORMAL HIGH (ref 70–99)
Glucose-Capillary: 110 mg/dL — ABNORMAL HIGH (ref 70–99)
Glucose-Capillary: 117 mg/dL — ABNORMAL HIGH (ref 70–99)
Glucose-Capillary: 117 mg/dL — ABNORMAL HIGH (ref 70–99)
Glucose-Capillary: 118 mg/dL — ABNORMAL HIGH (ref 70–99)
Glucose-Capillary: 119 mg/dL — ABNORMAL HIGH (ref 70–99)
Glucose-Capillary: 124 mg/dL — ABNORMAL HIGH (ref 70–99)
Glucose-Capillary: 139 mg/dL — ABNORMAL HIGH (ref 70–99)
Glucose-Capillary: 140 mg/dL — ABNORMAL HIGH (ref 70–99)
Glucose-Capillary: 142 mg/dL — ABNORMAL HIGH (ref 70–99)
Glucose-Capillary: 58 mg/dL — ABNORMAL LOW (ref 70–99)
Glucose-Capillary: 58 mg/dL — ABNORMAL LOW (ref 70–99)
Glucose-Capillary: 79 mg/dL (ref 70–99)
Glucose-Capillary: 79 mg/dL (ref 70–99)
Glucose-Capillary: 81 mg/dL (ref 70–99)
Glucose-Capillary: 85 mg/dL (ref 70–99)
Glucose-Capillary: 91 mg/dL (ref 70–99)
Glucose-Capillary: 91 mg/dL (ref 70–99)
Glucose-Capillary: 93 mg/dL (ref 70–99)
Glucose-Capillary: 96 mg/dL (ref 70–99)

## 2010-07-14 LAB — URINALYSIS, ROUTINE W REFLEX MICROSCOPIC
Bilirubin Urine: NEGATIVE
Glucose, UA: NEGATIVE mg/dL
Hgb urine dipstick: NEGATIVE
Nitrite: NEGATIVE
Protein, ur: 30 mg/dL — AB
Specific Gravity, Urine: 1.036 — ABNORMAL HIGH (ref 1.005–1.030)
Urobilinogen, UA: 0.2 mg/dL (ref 0.0–1.0)
pH: 6 (ref 5.0–8.0)

## 2010-07-14 LAB — CARDIAC PANEL(CRET KIN+CKTOT+MB+TROPI)
CK, MB: 0.8 ng/mL (ref 0.3–4.0)
CK, MB: 0.8 ng/mL (ref 0.3–4.0)
CK, MB: 0.8 ng/mL (ref 0.3–4.0)
Relative Index: INVALID (ref 0.0–2.5)
Relative Index: INVALID (ref 0.0–2.5)
Relative Index: INVALID (ref 0.0–2.5)
Total CK: 17 U/L (ref 7–177)
Total CK: 23 U/L (ref 7–177)
Total CK: 27 U/L (ref 7–177)
Troponin I: 0.01 ng/mL (ref 0.00–0.06)
Troponin I: 0.01 ng/mL (ref 0.00–0.06)
Troponin I: 0.06 ng/mL (ref 0.00–0.06)

## 2010-07-14 LAB — DIFFERENTIAL
Basophils Absolute: 0 10*3/uL (ref 0.0–0.1)
Basophils Relative: 0 % (ref 0–1)
Eosinophils Absolute: 0.1 10*3/uL (ref 0.0–0.7)
Eosinophils Relative: 1 % (ref 0–5)
Lymphocytes Relative: 7 % — ABNORMAL LOW (ref 12–46)
Lymphs Abs: 1.3 10*3/uL (ref 0.7–4.0)
Monocytes Absolute: 0.9 10*3/uL (ref 0.1–1.0)
Monocytes Relative: 5 % (ref 3–12)
Neutro Abs: 16.3 10*3/uL — ABNORMAL HIGH (ref 1.7–7.7)
Neutrophils Relative %: 88 % — ABNORMAL HIGH (ref 43–77)

## 2010-07-14 LAB — URINE MICROSCOPIC-ADD ON

## 2010-07-14 LAB — TSH: TSH: 1.5 u[IU]/mL (ref 0.350–4.500)

## 2010-07-14 LAB — LIPASE, BLOOD: Lipase: 21 U/L (ref 11–59)

## 2010-07-15 LAB — CBC
HCT: 26.9 % — ABNORMAL LOW (ref 36.0–46.0)
HCT: 27.1 % — ABNORMAL LOW (ref 36.0–46.0)
HCT: 27.8 % — ABNORMAL LOW (ref 36.0–46.0)
HCT: 28.2 % — ABNORMAL LOW (ref 36.0–46.0)
HCT: 28.9 % — ABNORMAL LOW (ref 36.0–46.0)
HCT: 29.5 % — ABNORMAL LOW (ref 36.0–46.0)
HCT: 29.9 % — ABNORMAL LOW (ref 36.0–46.0)
HCT: 31.5 % — ABNORMAL LOW (ref 36.0–46.0)
HCT: 33.5 % — ABNORMAL LOW (ref 36.0–46.0)
HCT: 36.5 % (ref 36.0–46.0)
Hemoglobin: 10.2 g/dL — ABNORMAL LOW (ref 12.0–15.0)
Hemoglobin: 11.1 g/dL — ABNORMAL LOW (ref 12.0–15.0)
Hemoglobin: 12.4 g/dL (ref 12.0–15.0)
Hemoglobin: 8.5 g/dL — ABNORMAL LOW (ref 12.0–15.0)
Hemoglobin: 8.6 g/dL — ABNORMAL LOW (ref 12.0–15.0)
Hemoglobin: 8.6 g/dL — ABNORMAL LOW (ref 12.0–15.0)
Hemoglobin: 9 g/dL — ABNORMAL LOW (ref 12.0–15.0)
Hemoglobin: 9 g/dL — ABNORMAL LOW (ref 12.0–15.0)
Hemoglobin: 9.2 g/dL — ABNORMAL LOW (ref 12.0–15.0)
Hemoglobin: 9.8 g/dL — ABNORMAL LOW (ref 12.0–15.0)
MCH: 24.2 pg — ABNORMAL LOW (ref 26.0–34.0)
MCH: 24.3 pg — ABNORMAL LOW (ref 26.0–34.0)
MCH: 24.4 pg — ABNORMAL LOW (ref 26.0–34.0)
MCH: 24.5 pg — ABNORMAL LOW (ref 26.0–34.0)
MCH: 24.5 pg — ABNORMAL LOW (ref 26.0–34.0)
MCH: 24.6 pg — ABNORMAL LOW (ref 26.0–34.0)
MCH: 24.7 pg — ABNORMAL LOW (ref 26.0–34.0)
MCH: 24.7 pg — ABNORMAL LOW (ref 26.0–34.0)
MCH: 24.9 pg — ABNORMAL LOW (ref 26.0–34.0)
MCH: 25.1 pg — ABNORMAL LOW (ref 26.0–34.0)
MCHC: 30.9 g/dL (ref 30.0–36.0)
MCHC: 31.1 g/dL (ref 30.0–36.0)
MCHC: 31.2 g/dL (ref 30.0–36.0)
MCHC: 31.6 g/dL (ref 30.0–36.0)
MCHC: 31.7 g/dL (ref 30.0–36.0)
MCHC: 31.9 g/dL (ref 30.0–36.0)
MCHC: 32.4 g/dL (ref 30.0–36.0)
MCHC: 32.8 g/dL (ref 30.0–36.0)
MCHC: 33.1 g/dL (ref 30.0–36.0)
MCHC: 34 g/dL (ref 30.0–36.0)
MCV: 73.7 fL — ABNORMAL LOW (ref 78.0–100.0)
MCV: 74.3 fL — ABNORMAL LOW (ref 78.0–100.0)
MCV: 75.7 fL — ABNORMAL LOW (ref 78.0–100.0)
MCV: 75.8 fL — ABNORMAL LOW (ref 78.0–100.0)
MCV: 75.9 fL — ABNORMAL LOW (ref 78.0–100.0)
MCV: 77.5 fL — ABNORMAL LOW (ref 78.0–100.0)
MCV: 77.9 fL — ABNORMAL LOW (ref 78.0–100.0)
MCV: 78.3 fL (ref 78.0–100.0)
MCV: 78.5 fL (ref 78.0–100.0)
MCV: 79.1 fL (ref 78.0–100.0)
Platelets: 309 10*3/uL (ref 150–400)
Platelets: 333 10*3/uL (ref 150–400)
Platelets: 337 10*3/uL (ref 150–400)
Platelets: 364 10*3/uL (ref 150–400)
Platelets: 377 10*3/uL (ref 150–400)
Platelets: 378 10*3/uL (ref 150–400)
Platelets: 401 10*3/uL — ABNORMAL HIGH (ref 150–400)
Platelets: 404 10*3/uL — ABNORMAL HIGH (ref 150–400)
Platelets: 404 10*3/uL — ABNORMAL HIGH (ref 150–400)
Platelets: 457 10*3/uL — ABNORMAL HIGH (ref 150–400)
RBC: 3.47 MIL/uL — ABNORMAL LOW (ref 3.87–5.11)
RBC: 3.48 MIL/uL — ABNORMAL LOW (ref 3.87–5.11)
RBC: 3.54 MIL/uL — ABNORMAL LOW (ref 3.87–5.11)
RBC: 3.69 MIL/uL — ABNORMAL LOW (ref 3.87–5.11)
RBC: 3.72 MIL/uL — ABNORMAL LOW (ref 3.87–5.11)
RBC: 3.73 MIL/uL — ABNORMAL LOW (ref 3.87–5.11)
RBC: 3.94 MIL/uL (ref 3.87–5.11)
RBC: 4.16 MIL/uL (ref 3.87–5.11)
RBC: 4.51 MIL/uL (ref 3.87–5.11)
RBC: 4.95 MIL/uL (ref 3.87–5.11)
RDW: 14.1 % (ref 11.5–15.5)
RDW: 14.1 % (ref 11.5–15.5)
RDW: 14.2 % (ref 11.5–15.5)
RDW: 14.3 % (ref 11.5–15.5)
RDW: 14.4 % (ref 11.5–15.5)
RDW: 14.4 % (ref 11.5–15.5)
RDW: 14.5 % (ref 11.5–15.5)
RDW: 14.6 % (ref 11.5–15.5)
RDW: 14.6 % (ref 11.5–15.5)
RDW: 14.6 % (ref 11.5–15.5)
WBC: 10.3 10*3/uL (ref 4.0–10.5)
WBC: 11 10*3/uL — ABNORMAL HIGH (ref 4.0–10.5)
WBC: 12.3 10*3/uL — ABNORMAL HIGH (ref 4.0–10.5)
WBC: 12.6 10*3/uL — ABNORMAL HIGH (ref 4.0–10.5)
WBC: 6.1 10*3/uL (ref 4.0–10.5)
WBC: 7.3 10*3/uL (ref 4.0–10.5)
WBC: 8.1 10*3/uL (ref 4.0–10.5)
WBC: 8.9 10*3/uL (ref 4.0–10.5)
WBC: 9.1 10*3/uL (ref 4.0–10.5)
WBC: 9.5 10*3/uL (ref 4.0–10.5)

## 2010-07-15 LAB — URINALYSIS, ROUTINE W REFLEX MICROSCOPIC
Bilirubin Urine: NEGATIVE
Bilirubin Urine: NEGATIVE
Glucose, UA: NEGATIVE mg/dL
Glucose, UA: NEGATIVE mg/dL
Hgb urine dipstick: NEGATIVE
Hgb urine dipstick: NEGATIVE
Ketones, ur: NEGATIVE mg/dL
Ketones, ur: NEGATIVE mg/dL
Nitrite: NEGATIVE
Nitrite: NEGATIVE
Protein, ur: NEGATIVE mg/dL
Protein, ur: NEGATIVE mg/dL
Specific Gravity, Urine: 1.025 (ref 1.005–1.030)
Specific Gravity, Urine: 1.034 — ABNORMAL HIGH (ref 1.005–1.030)
Urobilinogen, UA: 0.2 mg/dL (ref 0.0–1.0)
Urobilinogen, UA: 0.2 mg/dL (ref 0.0–1.0)
pH: 6.5 (ref 5.0–8.0)
pH: 6.5 (ref 5.0–8.0)

## 2010-07-15 LAB — CARDIAC PANEL(CRET KIN+CKTOT+MB+TROPI)
CK, MB: 0.9 ng/mL (ref 0.3–4.0)
CK, MB: 1 ng/mL (ref 0.3–4.0)
Relative Index: INVALID (ref 0.0–2.5)
Relative Index: INVALID (ref 0.0–2.5)
Total CK: 25 U/L (ref 7–177)
Total CK: 30 U/L (ref 7–177)
Troponin I: 0.01 ng/mL (ref 0.00–0.06)
Troponin I: 0.01 ng/mL (ref 0.00–0.06)

## 2010-07-15 LAB — GLUCOSE, CAPILLARY
Glucose-Capillary: 100 mg/dL — ABNORMAL HIGH (ref 70–99)
Glucose-Capillary: 104 mg/dL — ABNORMAL HIGH (ref 70–99)
Glucose-Capillary: 105 mg/dL — ABNORMAL HIGH (ref 70–99)
Glucose-Capillary: 106 mg/dL — ABNORMAL HIGH (ref 70–99)
Glucose-Capillary: 106 mg/dL — ABNORMAL HIGH (ref 70–99)
Glucose-Capillary: 106 mg/dL — ABNORMAL HIGH (ref 70–99)
Glucose-Capillary: 109 mg/dL — ABNORMAL HIGH (ref 70–99)
Glucose-Capillary: 111 mg/dL — ABNORMAL HIGH (ref 70–99)
Glucose-Capillary: 112 mg/dL — ABNORMAL HIGH (ref 70–99)
Glucose-Capillary: 113 mg/dL — ABNORMAL HIGH (ref 70–99)
Glucose-Capillary: 114 mg/dL — ABNORMAL HIGH (ref 70–99)
Glucose-Capillary: 116 mg/dL — ABNORMAL HIGH (ref 70–99)
Glucose-Capillary: 116 mg/dL — ABNORMAL HIGH (ref 70–99)
Glucose-Capillary: 117 mg/dL — ABNORMAL HIGH (ref 70–99)
Glucose-Capillary: 118 mg/dL — ABNORMAL HIGH (ref 70–99)
Glucose-Capillary: 118 mg/dL — ABNORMAL HIGH (ref 70–99)
Glucose-Capillary: 119 mg/dL — ABNORMAL HIGH (ref 70–99)
Glucose-Capillary: 119 mg/dL — ABNORMAL HIGH (ref 70–99)
Glucose-Capillary: 120 mg/dL — ABNORMAL HIGH (ref 70–99)
Glucose-Capillary: 122 mg/dL — ABNORMAL HIGH (ref 70–99)
Glucose-Capillary: 123 mg/dL — ABNORMAL HIGH (ref 70–99)
Glucose-Capillary: 124 mg/dL — ABNORMAL HIGH (ref 70–99)
Glucose-Capillary: 125 mg/dL — ABNORMAL HIGH (ref 70–99)
Glucose-Capillary: 125 mg/dL — ABNORMAL HIGH (ref 70–99)
Glucose-Capillary: 127 mg/dL — ABNORMAL HIGH (ref 70–99)
Glucose-Capillary: 127 mg/dL — ABNORMAL HIGH (ref 70–99)
Glucose-Capillary: 128 mg/dL — ABNORMAL HIGH (ref 70–99)
Glucose-Capillary: 128 mg/dL — ABNORMAL HIGH (ref 70–99)
Glucose-Capillary: 128 mg/dL — ABNORMAL HIGH (ref 70–99)
Glucose-Capillary: 131 mg/dL — ABNORMAL HIGH (ref 70–99)
Glucose-Capillary: 134 mg/dL — ABNORMAL HIGH (ref 70–99)
Glucose-Capillary: 134 mg/dL — ABNORMAL HIGH (ref 70–99)
Glucose-Capillary: 135 mg/dL — ABNORMAL HIGH (ref 70–99)
Glucose-Capillary: 135 mg/dL — ABNORMAL HIGH (ref 70–99)
Glucose-Capillary: 136 mg/dL — ABNORMAL HIGH (ref 70–99)
Glucose-Capillary: 137 mg/dL — ABNORMAL HIGH (ref 70–99)
Glucose-Capillary: 139 mg/dL — ABNORMAL HIGH (ref 70–99)
Glucose-Capillary: 142 mg/dL — ABNORMAL HIGH (ref 70–99)
Glucose-Capillary: 142 mg/dL — ABNORMAL HIGH (ref 70–99)
Glucose-Capillary: 143 mg/dL — ABNORMAL HIGH (ref 70–99)
Glucose-Capillary: 144 mg/dL — ABNORMAL HIGH (ref 70–99)
Glucose-Capillary: 146 mg/dL — ABNORMAL HIGH (ref 70–99)
Glucose-Capillary: 148 mg/dL — ABNORMAL HIGH (ref 70–99)
Glucose-Capillary: 151 mg/dL — ABNORMAL HIGH (ref 70–99)
Glucose-Capillary: 155 mg/dL — ABNORMAL HIGH (ref 70–99)
Glucose-Capillary: 156 mg/dL — ABNORMAL HIGH (ref 70–99)
Glucose-Capillary: 158 mg/dL — ABNORMAL HIGH (ref 70–99)
Glucose-Capillary: 159 mg/dL — ABNORMAL HIGH (ref 70–99)
Glucose-Capillary: 191 mg/dL — ABNORMAL HIGH (ref 70–99)
Glucose-Capillary: 69 mg/dL — ABNORMAL LOW (ref 70–99)
Glucose-Capillary: 70 mg/dL (ref 70–99)
Glucose-Capillary: 71 mg/dL (ref 70–99)
Glucose-Capillary: 71 mg/dL (ref 70–99)
Glucose-Capillary: 75 mg/dL (ref 70–99)
Glucose-Capillary: 76 mg/dL (ref 70–99)
Glucose-Capillary: 79 mg/dL (ref 70–99)
Glucose-Capillary: 79 mg/dL (ref 70–99)
Glucose-Capillary: 80 mg/dL (ref 70–99)
Glucose-Capillary: 81 mg/dL (ref 70–99)
Glucose-Capillary: 81 mg/dL (ref 70–99)
Glucose-Capillary: 81 mg/dL (ref 70–99)
Glucose-Capillary: 81 mg/dL (ref 70–99)
Glucose-Capillary: 82 mg/dL (ref 70–99)
Glucose-Capillary: 84 mg/dL (ref 70–99)
Glucose-Capillary: 88 mg/dL (ref 70–99)
Glucose-Capillary: 88 mg/dL (ref 70–99)
Glucose-Capillary: 88 mg/dL (ref 70–99)
Glucose-Capillary: 89 mg/dL (ref 70–99)
Glucose-Capillary: 91 mg/dL (ref 70–99)
Glucose-Capillary: 95 mg/dL (ref 70–99)
Glucose-Capillary: 95 mg/dL (ref 70–99)
Glucose-Capillary: 98 mg/dL (ref 70–99)

## 2010-07-15 LAB — BASIC METABOLIC PANEL
BUN: 10 mg/dL (ref 6–23)
BUN: 12 mg/dL (ref 6–23)
BUN: 12 mg/dL (ref 6–23)
BUN: 13 mg/dL (ref 6–23)
BUN: 13 mg/dL (ref 6–23)
BUN: 17 mg/dL (ref 6–23)
BUN: 21 mg/dL (ref 6–23)
BUN: 9 mg/dL (ref 6–23)
CO2: 26 mEq/L (ref 19–32)
CO2: 27 mEq/L (ref 19–32)
CO2: 29 mEq/L (ref 19–32)
CO2: 30 mEq/L (ref 19–32)
CO2: 30 mEq/L (ref 19–32)
CO2: 30 mEq/L (ref 19–32)
CO2: 31 mEq/L (ref 19–32)
CO2: 31 mEq/L (ref 19–32)
Calcium: 7.6 mg/dL — ABNORMAL LOW (ref 8.4–10.5)
Calcium: 7.6 mg/dL — ABNORMAL LOW (ref 8.4–10.5)
Calcium: 7.8 mg/dL — ABNORMAL LOW (ref 8.4–10.5)
Calcium: 8 mg/dL — ABNORMAL LOW (ref 8.4–10.5)
Calcium: 8 mg/dL — ABNORMAL LOW (ref 8.4–10.5)
Calcium: 8 mg/dL — ABNORMAL LOW (ref 8.4–10.5)
Calcium: 8.1 mg/dL — ABNORMAL LOW (ref 8.4–10.5)
Calcium: 8.2 mg/dL — ABNORMAL LOW (ref 8.4–10.5)
Chloride: 103 mEq/L (ref 96–112)
Chloride: 105 mEq/L (ref 96–112)
Chloride: 108 mEq/L (ref 96–112)
Chloride: 108 mEq/L (ref 96–112)
Chloride: 108 mEq/L (ref 96–112)
Chloride: 108 mEq/L (ref 96–112)
Chloride: 108 mEq/L (ref 96–112)
Chloride: 108 mEq/L (ref 96–112)
Creatinine, Ser: 0.44 mg/dL (ref 0.4–1.2)
Creatinine, Ser: 0.49 mg/dL (ref 0.4–1.2)
Creatinine, Ser: 0.52 mg/dL (ref 0.4–1.2)
Creatinine, Ser: 0.56 mg/dL (ref 0.4–1.2)
Creatinine, Ser: 0.73 mg/dL (ref 0.4–1.2)
Creatinine, Ser: 0.86 mg/dL (ref 0.4–1.2)
Creatinine, Ser: 1.1 mg/dL (ref 0.4–1.2)
Creatinine, Ser: 1.42 mg/dL — ABNORMAL HIGH (ref 0.4–1.2)
GFR calc Af Amer: 44 mL/min — ABNORMAL LOW (ref >60)
GFR calc Af Amer: 60 mL/min — ABNORMAL LOW (ref >60)
GFR calc Af Amer: >60 mL/min (ref >60)
GFR calc Af Amer: >60 mL/min (ref >60)
GFR calc Af Amer: >60 mL/min (ref >60)
GFR calc Af Amer: >60 mL/min (ref >60)
GFR calc Af Amer: >60 mL/min (ref >60)
GFR calc Af Amer: >60 mL/min (ref >60)
GFR calc non Af Amer: 37 mL/min — ABNORMAL LOW (ref >60)
GFR calc non Af Amer: 49 mL/min — ABNORMAL LOW (ref >60)
GFR calc non Af Amer: >60 mL/min (ref >60)
GFR calc non Af Amer: >60 mL/min (ref >60)
GFR calc non Af Amer: >60 mL/min (ref >60)
GFR calc non Af Amer: >60 mL/min (ref >60)
GFR calc non Af Amer: >60 mL/min (ref >60)
GFR calc non Af Amer: >60 mL/min (ref >60)
Glucose, Bld: 111 mg/dL — ABNORMAL HIGH (ref 70–99)
Glucose, Bld: 119 mg/dL — ABNORMAL HIGH (ref 70–99)
Glucose, Bld: 120 mg/dL — ABNORMAL HIGH (ref 70–99)
Glucose, Bld: 121 mg/dL — ABNORMAL HIGH (ref 70–99)
Glucose, Bld: 136 mg/dL — ABNORMAL HIGH (ref 70–99)
Glucose, Bld: 137 mg/dL — ABNORMAL HIGH (ref 70–99)
Glucose, Bld: 79 mg/dL (ref 70–99)
Glucose, Bld: 94 mg/dL (ref 70–99)
Potassium: 3.2 mEq/L — ABNORMAL LOW (ref 3.5–5.1)
Potassium: 3.5 mEq/L (ref 3.5–5.1)
Potassium: 3.7 mEq/L (ref 3.5–5.1)
Potassium: 3.7 mEq/L (ref 3.5–5.1)
Potassium: 3.8 mEq/L (ref 3.5–5.1)
Potassium: 3.9 mEq/L (ref 3.5–5.1)
Potassium: 4.1 mEq/L (ref 3.5–5.1)
Potassium: 4.7 mEq/L (ref 3.5–5.1)
Sodium: 140 mEq/L (ref 135–145)
Sodium: 141 mEq/L (ref 135–145)
Sodium: 141 mEq/L (ref 135–145)
Sodium: 142 mEq/L (ref 135–145)
Sodium: 142 mEq/L (ref 135–145)
Sodium: 142 mEq/L (ref 135–145)
Sodium: 143 mEq/L (ref 135–145)
Sodium: 146 mEq/L — ABNORMAL HIGH (ref 135–145)

## 2010-07-15 LAB — COMPREHENSIVE METABOLIC PANEL
ALT: 20 U/L (ref 0–35)
ALT: 25 U/L (ref 0–35)
AST: 15 U/L (ref 0–37)
AST: 20 U/L (ref 0–37)
Albumin: 1.9 g/dL — ABNORMAL LOW (ref 3.5–5.2)
Albumin: 2.6 g/dL — ABNORMAL LOW (ref 3.5–5.2)
Alkaline Phosphatase: 51 U/L (ref 39–117)
Alkaline Phosphatase: 84 U/L (ref 39–117)
BUN: 12 mg/dL (ref 6–23)
BUN: 14 mg/dL (ref 6–23)
CO2: 28 mEq/L (ref 19–32)
CO2: 29 mEq/L (ref 19–32)
Calcium: 7.8 mg/dL — ABNORMAL LOW (ref 8.4–10.5)
Calcium: 8.4 mg/dL (ref 8.4–10.5)
Chloride: 102 mEq/L (ref 96–112)
Chloride: 109 mEq/L (ref 96–112)
Creatinine, Ser: 0.53 mg/dL (ref 0.4–1.2)
Creatinine, Ser: 0.68 mg/dL (ref 0.4–1.2)
GFR calc Af Amer: >60 mL/min (ref >60)
GFR calc Af Amer: >60 mL/min (ref >60)
GFR calc non Af Amer: >60 mL/min (ref >60)
GFR calc non Af Amer: >60 mL/min (ref >60)
Glucose, Bld: 114 mg/dL — ABNORMAL HIGH (ref 70–99)
Glucose, Bld: 81 mg/dL (ref 70–99)
Potassium: 2.5 mEq/L — CL (ref 3.5–5.1)
Potassium: 3.5 mEq/L (ref 3.5–5.1)
Sodium: 140 mEq/L (ref 135–145)
Sodium: 142 mEq/L (ref 135–145)
Total Bilirubin: 0.3 mg/dL (ref 0.3–1.2)
Total Bilirubin: 0.4 mg/dL (ref 0.3–1.2)
Total Protein: 4.5 g/dL — ABNORMAL LOW (ref 6.0–8.3)
Total Protein: 5.4 g/dL — ABNORMAL LOW (ref 6.0–8.3)

## 2010-07-15 LAB — CULTURE, BLOOD (ROUTINE X 2)
Culture  Setup Time: 201112020905
Culture  Setup Time: 201112020905
Culture: NO GROWTH
Culture: NO GROWTH

## 2010-07-15 LAB — DIFFERENTIAL
Basophils Absolute: 0 10*3/uL (ref 0.0–0.1)
Basophils Relative: 0 % (ref 0–1)
Eosinophils Absolute: 0.4 10*3/uL (ref 0.0–0.7)
Eosinophils Relative: 3 % (ref 0–5)
Lymphocytes Relative: 20 % (ref 12–46)
Lymphs Abs: 2.5 10*3/uL (ref 0.7–4.0)
Monocytes Absolute: 0.9 10*3/uL (ref 0.1–1.0)
Monocytes Relative: 7 % (ref 3–12)
Neutro Abs: 8.8 10*3/uL — ABNORMAL HIGH (ref 1.7–7.7)
Neutrophils Relative %: 70 % (ref 43–77)

## 2010-07-15 LAB — URINE CULTURE
Colony Count: NO GROWTH
Culture  Setup Time: 201112070147
Culture: NO GROWTH
Special Requests: NEGATIVE

## 2010-07-15 LAB — URINE MICROSCOPIC-ADD ON

## 2010-07-15 LAB — HEPATIC FUNCTION PANEL
ALT: 21 U/L (ref 0–35)
AST: 22 U/L (ref 0–37)
Albumin: 2.2 g/dL — ABNORMAL LOW (ref 3.5–5.2)
Alkaline Phosphatase: 72 U/L (ref 39–117)
Bilirubin, Direct: 0.1 mg/dL (ref 0.0–0.3)
Indirect Bilirubin: 0.3 mg/dL (ref 0.3–0.9)
Total Bilirubin: 0.4 mg/dL (ref 0.3–1.2)
Total Protein: 4.5 g/dL — ABNORMAL LOW (ref 6.0–8.3)

## 2010-07-15 LAB — LIPASE, BLOOD: Lipase: 22 U/L (ref 11–59)

## 2010-07-15 LAB — CK TOTAL AND CKMB (NOT AT ARMC)
CK, MB: 0.9 ng/mL (ref 0.3–4.0)
Relative Index: INVALID (ref 0.0–2.5)
Total CK: 24 U/L (ref 7–177)

## 2010-07-15 LAB — HEMOGLOBIN A1C
Hgb A1c MFr Bld: 4.7 % (ref <5.7)
Mean Plasma Glucose: 88 mg/dL (ref <117)

## 2010-07-15 LAB — TROPONIN I: Troponin I: 0.01 ng/mL (ref 0.00–0.06)

## 2010-07-15 LAB — MAGNESIUM
Magnesium: 1.7 mg/dL (ref 1.5–2.5)
Magnesium: 1.8 mg/dL (ref 1.5–2.5)

## 2010-07-15 LAB — TSH: TSH: 0.952 u[IU]/mL (ref 0.350–4.500)

## 2010-07-15 LAB — PHOSPHORUS: Phosphorus: 3 mg/dL (ref 2.3–4.6)

## 2010-07-15 LAB — CLOSTRIDIUM DIFFICILE BY PCR: Toxigenic C. Difficile by PCR: NEGATIVE

## 2010-07-15 NOTE — Miscellaneous (Signed)
Summary: Orders/[facility]  Orders/[facility]   Imported By: Lester Liberty 07/10/2010 10:19:19  _____________________________________________________________________  External Attachment:    Type:   Image     Comment:   External Document

## 2010-07-15 NOTE — Letter (Signed)
Summary: CMN for Pump/[facility]  CMN for Pump/[facility]   Imported By: Sherian Rein 07/10/2010 11:10:06  _____________________________________________________________________  External Attachment:    Type:   Image     Comment:   External Document

## 2010-07-16 LAB — CLOSTRIDIUM DIFFICILE EIA: C difficile Toxins A+B, EIA: NEGATIVE

## 2010-07-16 LAB — URINE MICROSCOPIC-ADD ON

## 2010-07-16 LAB — BASIC METABOLIC PANEL
BUN: 10 mg/dL (ref 6–23)
BUN: 5 mg/dL — ABNORMAL LOW (ref 6–23)
BUN: 7 mg/dL (ref 6–23)
CO2: 27 mEq/L (ref 19–32)
CO2: 28 mEq/L (ref 19–32)
CO2: 30 mEq/L (ref 19–32)
Calcium: 7.8 mg/dL — ABNORMAL LOW (ref 8.4–10.5)
Calcium: 7.8 mg/dL — ABNORMAL LOW (ref 8.4–10.5)
Calcium: 8 mg/dL — ABNORMAL LOW (ref 8.4–10.5)
Chloride: 105 mEq/L (ref 96–112)
Chloride: 108 mEq/L (ref 96–112)
Chloride: 111 mEq/L (ref 96–112)
Creatinine, Ser: 0.45 mg/dL (ref 0.4–1.2)
Creatinine, Ser: 0.48 mg/dL (ref 0.4–1.2)
Creatinine, Ser: 0.49 mg/dL (ref 0.4–1.2)
GFR calc Af Amer: >60 mL/min (ref >60)
GFR calc Af Amer: >60 mL/min (ref >60)
GFR calc Af Amer: >60 mL/min (ref >60)
GFR calc non Af Amer: >60 mL/min (ref >60)
GFR calc non Af Amer: >60 mL/min (ref >60)
GFR calc non Af Amer: >60 mL/min (ref >60)
Glucose, Bld: 104 mg/dL — ABNORMAL HIGH (ref 70–99)
Glucose, Bld: 83 mg/dL (ref 70–99)
Glucose, Bld: 92 mg/dL (ref 70–99)
Potassium: 3.9 mEq/L (ref 3.5–5.1)
Potassium: 3.9 mEq/L (ref 3.5–5.1)
Potassium: 4 mEq/L (ref 3.5–5.1)
Sodium: 134 mEq/L — ABNORMAL LOW (ref 135–145)
Sodium: 139 mEq/L (ref 135–145)
Sodium: 145 mEq/L (ref 135–145)

## 2010-07-16 LAB — CBC
HCT: 29.6 % — ABNORMAL LOW (ref 36.0–46.0)
HCT: 30 % — ABNORMAL LOW (ref 36.0–46.0)
HCT: 34.8 % — ABNORMAL LOW (ref 36.0–46.0)
HCT: 39.3 % (ref 36.0–46.0)
Hemoglobin: 11.1 g/dL — ABNORMAL LOW (ref 12.0–15.0)
Hemoglobin: 12.5 g/dL (ref 12.0–15.0)
Hemoglobin: 9.5 g/dL — ABNORMAL LOW (ref 12.0–15.0)
Hemoglobin: 9.7 g/dL — ABNORMAL LOW (ref 12.0–15.0)
MCH: 24.8 pg — ABNORMAL LOW (ref 26.0–34.0)
MCH: 25 pg — ABNORMAL LOW (ref 26.0–34.0)
MCH: 25.2 pg — ABNORMAL LOW (ref 26.0–34.0)
MCH: 25.3 pg — ABNORMAL LOW (ref 26.0–34.0)
MCHC: 31.7 g/dL (ref 30.0–36.0)
MCHC: 32 g/dL (ref 30.0–36.0)
MCHC: 32.3 g/dL (ref 30.0–36.0)
MCHC: 32.3 g/dL (ref 30.0–36.0)
MCV: 78.2 fL (ref 78.0–100.0)
MCV: 78.2 fL (ref 78.0–100.0)
MCV: 78.2 fL (ref 78.0–100.0)
MCV: 78.3 fL (ref 78.0–100.0)
Platelets: 298 10*3/uL (ref 150–400)
Platelets: 348 10*3/uL (ref 150–400)
Platelets: 370 10*3/uL (ref 150–400)
Platelets: 416 10*3/uL — ABNORMAL HIGH (ref 150–400)
RBC: 3.78 MIL/uL — ABNORMAL LOW (ref 3.87–5.11)
RBC: 3.84 MIL/uL — ABNORMAL LOW (ref 3.87–5.11)
RBC: 4.45 MIL/uL (ref 3.87–5.11)
RBC: 5.03 MIL/uL (ref 3.87–5.11)
RDW: 13.7 % (ref 11.5–15.5)
RDW: 13.8 % (ref 11.5–15.5)
RDW: 13.9 % (ref 11.5–15.5)
RDW: 14.2 % (ref 11.5–15.5)
WBC: 10.1 10*3/uL (ref 4.0–10.5)
WBC: 15.9 10*3/uL — ABNORMAL HIGH (ref 4.0–10.5)
WBC: 18.8 10*3/uL — ABNORMAL HIGH (ref 4.0–10.5)
WBC: 6.1 10*3/uL (ref 4.0–10.5)

## 2010-07-16 LAB — URINALYSIS, ROUTINE W REFLEX MICROSCOPIC
Bilirubin Urine: NEGATIVE
Bilirubin Urine: NEGATIVE
Glucose, UA: NEGATIVE mg/dL
Glucose, UA: NEGATIVE mg/dL
Hgb urine dipstick: NEGATIVE
Hgb urine dipstick: NEGATIVE
Ketones, ur: NEGATIVE mg/dL
Nitrite: NEGATIVE
Nitrite: NEGATIVE
Protein, ur: 30 mg/dL — AB
Protein, ur: NEGATIVE mg/dL
Specific Gravity, Urine: 1.026 (ref 1.005–1.030)
Specific Gravity, Urine: 1.028 (ref 1.005–1.030)
Urobilinogen, UA: 0.2 mg/dL (ref 0.0–1.0)
Urobilinogen, UA: 1 mg/dL (ref 0.0–1.0)
pH: 6 (ref 5.0–8.0)
pH: 7.5 (ref 5.0–8.0)

## 2010-07-16 LAB — GLUCOSE, CAPILLARY
Glucose-Capillary: 102 mg/dL — ABNORMAL HIGH (ref 70–99)
Glucose-Capillary: 106 mg/dL — ABNORMAL HIGH (ref 70–99)
Glucose-Capillary: 111 mg/dL — ABNORMAL HIGH (ref 70–99)
Glucose-Capillary: 113 mg/dL — ABNORMAL HIGH (ref 70–99)
Glucose-Capillary: 116 mg/dL — ABNORMAL HIGH (ref 70–99)
Glucose-Capillary: 119 mg/dL — ABNORMAL HIGH (ref 70–99)
Glucose-Capillary: 123 mg/dL — ABNORMAL HIGH (ref 70–99)
Glucose-Capillary: 123 mg/dL — ABNORMAL HIGH (ref 70–99)
Glucose-Capillary: 123 mg/dL — ABNORMAL HIGH (ref 70–99)
Glucose-Capillary: 68 mg/dL — ABNORMAL LOW (ref 70–99)
Glucose-Capillary: 71 mg/dL (ref 70–99)
Glucose-Capillary: 78 mg/dL (ref 70–99)
Glucose-Capillary: 78 mg/dL (ref 70–99)
Glucose-Capillary: 82 mg/dL (ref 70–99)
Glucose-Capillary: 86 mg/dL (ref 70–99)
Glucose-Capillary: 89 mg/dL (ref 70–99)
Glucose-Capillary: 90 mg/dL (ref 70–99)
Glucose-Capillary: 90 mg/dL (ref 70–99)
Glucose-Capillary: 92 mg/dL (ref 70–99)
Glucose-Capillary: 93 mg/dL (ref 70–99)
Glucose-Capillary: 94 mg/dL (ref 70–99)
Glucose-Capillary: 94 mg/dL (ref 70–99)
Glucose-Capillary: 94 mg/dL (ref 70–99)
Glucose-Capillary: 95 mg/dL (ref 70–99)
Glucose-Capillary: 96 mg/dL (ref 70–99)
Glucose-Capillary: 97 mg/dL (ref 70–99)
Glucose-Capillary: 97 mg/dL (ref 70–99)
Glucose-Capillary: 99 mg/dL (ref 70–99)

## 2010-07-16 LAB — FECAL LACTOFERRIN, QUANT: Fecal Lactoferrin: POSITIVE

## 2010-07-16 LAB — DIFFERENTIAL
Basophils Absolute: 0 10*3/uL (ref 0.0–0.1)
Basophils Absolute: 0.1 10*3/uL (ref 0.0–0.1)
Basophils Relative: 0 % (ref 0–1)
Basophils Relative: 1 % (ref 0–1)
Eosinophils Absolute: 0 10*3/uL (ref 0.0–0.7)
Eosinophils Absolute: 0.1 10*3/uL (ref 0.0–0.7)
Eosinophils Relative: 0 % (ref 0–5)
Eosinophils Relative: 0 % (ref 0–5)
Lymphocytes Relative: 4 % — ABNORMAL LOW (ref 12–46)
Lymphocytes Relative: 7 % — ABNORMAL LOW (ref 12–46)
Lymphs Abs: 0.8 10*3/uL (ref 0.7–4.0)
Lymphs Abs: 1.2 10*3/uL (ref 0.7–4.0)
Monocytes Absolute: 0.9 10*3/uL (ref 0.1–1.0)
Monocytes Absolute: 0.9 10*3/uL (ref 0.1–1.0)
Monocytes Relative: 5 % (ref 3–12)
Monocytes Relative: 6 % (ref 3–12)
Neutro Abs: 13.7 10*3/uL — ABNORMAL HIGH (ref 1.7–7.7)
Neutro Abs: 17 10*3/uL — ABNORMAL HIGH (ref 1.7–7.7)
Neutrophils Relative %: 86 % — ABNORMAL HIGH (ref 43–77)
Neutrophils Relative %: 90 % — ABNORMAL HIGH (ref 43–77)

## 2010-07-16 LAB — URINE CULTURE
Colony Count: NO GROWTH
Culture  Setup Time: 201110200035
Culture: NO GROWTH
Special Requests: NEGATIVE

## 2010-07-16 LAB — COMPREHENSIVE METABOLIC PANEL
ALT: 20 U/L (ref 0–35)
ALT: 23 U/L (ref 0–35)
AST: 24 U/L (ref 0–37)
AST: 24 U/L (ref 0–37)
Albumin: 2.7 g/dL — ABNORMAL LOW (ref 3.5–5.2)
Albumin: 2.8 g/dL — ABNORMAL LOW (ref 3.5–5.2)
Alkaline Phosphatase: 76 U/L (ref 39–117)
Alkaline Phosphatase: 93 U/L (ref 39–117)
BUN: 8 mg/dL (ref 6–23)
BUN: 8 mg/dL (ref 6–23)
CO2: 27 mEq/L (ref 19–32)
CO2: 30 mEq/L (ref 19–32)
Calcium: 8.3 mg/dL — ABNORMAL LOW (ref 8.4–10.5)
Calcium: 8.5 mg/dL (ref 8.4–10.5)
Chloride: 102 mEq/L (ref 96–112)
Chloride: 104 mEq/L (ref 96–112)
Creatinine, Ser: 0.49 mg/dL (ref 0.4–1.2)
Creatinine, Ser: 0.52 mg/dL (ref 0.4–1.2)
GFR calc Af Amer: >60 mL/min (ref >60)
GFR calc Af Amer: >60 mL/min (ref >60)
GFR calc non Af Amer: >60 mL/min (ref >60)
GFR calc non Af Amer: >60 mL/min (ref >60)
Glucose, Bld: 127 mg/dL — ABNORMAL HIGH (ref 70–99)
Glucose, Bld: 92 mg/dL (ref 70–99)
Potassium: 2.4 mEq/L — CL (ref 3.5–5.1)
Potassium: 3 mEq/L — ABNORMAL LOW (ref 3.5–5.1)
Sodium: 138 mEq/L (ref 135–145)
Sodium: 140 mEq/L (ref 135–145)
Total Bilirubin: 0.3 mg/dL (ref 0.3–1.2)
Total Bilirubin: 0.6 mg/dL (ref 0.3–1.2)
Total Protein: 5.1 g/dL — ABNORMAL LOW (ref 6.0–8.3)
Total Protein: 5.8 g/dL — ABNORMAL LOW (ref 6.0–8.3)

## 2010-07-16 LAB — CULTURE, BLOOD (ROUTINE X 2)
Culture  Setup Time: 201110181912
Culture  Setup Time: 201110181912
Culture: NO GROWTH
Culture: NO GROWTH

## 2010-07-16 LAB — OVA AND PARASITE EXAMINATION

## 2010-07-16 LAB — LIPASE, BLOOD
Lipase: 24 U/L (ref 11–59)
Lipase: 25 U/L (ref 11–59)

## 2010-07-16 LAB — STOOL CULTURE

## 2010-07-17 LAB — GLUCOSE, CAPILLARY
Glucose-Capillary: 108 mg/dL — ABNORMAL HIGH (ref 70–99)
Glucose-Capillary: 110 mg/dL — ABNORMAL HIGH (ref 70–99)
Glucose-Capillary: 112 mg/dL — ABNORMAL HIGH (ref 70–99)
Glucose-Capillary: 112 mg/dL — ABNORMAL HIGH (ref 70–99)
Glucose-Capillary: 113 mg/dL — ABNORMAL HIGH (ref 70–99)
Glucose-Capillary: 115 mg/dL — ABNORMAL HIGH (ref 70–99)
Glucose-Capillary: 118 mg/dL — ABNORMAL HIGH (ref 70–99)
Glucose-Capillary: 119 mg/dL — ABNORMAL HIGH (ref 70–99)
Glucose-Capillary: 119 mg/dL — ABNORMAL HIGH (ref 70–99)
Glucose-Capillary: 119 mg/dL — ABNORMAL HIGH (ref 70–99)
Glucose-Capillary: 119 mg/dL — ABNORMAL HIGH (ref 70–99)
Glucose-Capillary: 123 mg/dL — ABNORMAL HIGH (ref 70–99)
Glucose-Capillary: 131 mg/dL — ABNORMAL HIGH (ref 70–99)
Glucose-Capillary: 148 mg/dL — ABNORMAL HIGH (ref 70–99)
Glucose-Capillary: 157 mg/dL — ABNORMAL HIGH (ref 70–99)
Glucose-Capillary: 41 mg/dL — CL (ref 70–99)
Glucose-Capillary: 42 mg/dL — CL (ref 70–99)
Glucose-Capillary: 53 mg/dL — ABNORMAL LOW (ref 70–99)
Glucose-Capillary: 60 mg/dL — ABNORMAL LOW (ref 70–99)
Glucose-Capillary: 68 mg/dL — ABNORMAL LOW (ref 70–99)
Glucose-Capillary: 70 mg/dL (ref 70–99)
Glucose-Capillary: 71 mg/dL (ref 70–99)
Glucose-Capillary: 71 mg/dL (ref 70–99)
Glucose-Capillary: 77 mg/dL (ref 70–99)
Glucose-Capillary: 77 mg/dL (ref 70–99)
Glucose-Capillary: 81 mg/dL (ref 70–99)
Glucose-Capillary: 86 mg/dL (ref 70–99)
Glucose-Capillary: 89 mg/dL (ref 70–99)
Glucose-Capillary: 89 mg/dL (ref 70–99)
Glucose-Capillary: 93 mg/dL (ref 70–99)
Glucose-Capillary: 98 mg/dL (ref 70–99)
Glucose-Capillary: 99 mg/dL (ref 70–99)

## 2010-07-17 LAB — CLOSTRIDIUM DIFFICILE EIA
C difficile Toxins A+B, EIA: NEGATIVE
C difficile Toxins A+B, EIA: NEGATIVE
C difficile Toxins A+B, EIA: NEGATIVE

## 2010-07-17 LAB — DIFFERENTIAL
Basophils Absolute: 0 10*3/uL (ref 0.0–0.1)
Basophils Absolute: 0 10*3/uL (ref 0.0–0.1)
Basophils Relative: 0 % (ref 0–1)
Basophils Relative: 0 % (ref 0–1)
Eosinophils Absolute: 0.1 10*3/uL (ref 0.0–0.7)
Eosinophils Absolute: 0.2 10*3/uL (ref 0.0–0.7)
Eosinophils Relative: 0 % (ref 0–5)
Eosinophils Relative: 2 % (ref 0–5)
Lymphocytes Relative: 21 % (ref 12–46)
Lymphocytes Relative: 8 % — ABNORMAL LOW (ref 12–46)
Lymphs Abs: 1.5 10*3/uL (ref 0.7–4.0)
Lymphs Abs: 2.3 10*3/uL (ref 0.7–4.0)
Monocytes Absolute: 0.6 10*3/uL (ref 0.1–1.0)
Monocytes Absolute: 1.4 10*3/uL — ABNORMAL HIGH (ref 0.1–1.0)
Monocytes Relative: 5 % (ref 3–12)
Monocytes Relative: 7 % (ref 3–12)
Neutro Abs: 16 10*3/uL — ABNORMAL HIGH (ref 1.7–7.7)
Neutro Abs: 7.7 10*3/uL (ref 1.7–7.7)
Neutrophils Relative %: 71 % (ref 43–77)
Neutrophils Relative %: 84 % — ABNORMAL HIGH (ref 43–77)

## 2010-07-17 LAB — CBC
HCT: 28.2 % — ABNORMAL LOW (ref 36.0–46.0)
HCT: 32.2 % — ABNORMAL LOW (ref 36.0–46.0)
HCT: 32.3 % — ABNORMAL LOW (ref 36.0–46.0)
HCT: 32.8 % — ABNORMAL LOW (ref 36.0–46.0)
HCT: 32.9 % — ABNORMAL LOW (ref 36.0–46.0)
HCT: 33.2 % — ABNORMAL LOW (ref 36.0–46.0)
HCT: 34.1 % — ABNORMAL LOW (ref 36.0–46.0)
HCT: 36.4 % (ref 36.0–46.0)
Hemoglobin: 10.4 g/dL — ABNORMAL LOW (ref 12.0–15.0)
Hemoglobin: 10.5 g/dL — ABNORMAL LOW (ref 12.0–15.0)
Hemoglobin: 10.7 g/dL — ABNORMAL LOW (ref 12.0–15.0)
Hemoglobin: 10.7 g/dL — ABNORMAL LOW (ref 12.0–15.0)
Hemoglobin: 10.8 g/dL — ABNORMAL LOW (ref 12.0–15.0)
Hemoglobin: 11 g/dL — ABNORMAL LOW (ref 12.0–15.0)
Hemoglobin: 11.7 g/dL — ABNORMAL LOW (ref 12.0–15.0)
Hemoglobin: 9.1 g/dL — ABNORMAL LOW (ref 12.0–15.0)
MCH: 24.5 pg — ABNORMAL LOW (ref 26.0–34.0)
MCH: 24.7 pg — ABNORMAL LOW (ref 26.0–34.0)
MCH: 24.7 pg — ABNORMAL LOW (ref 26.0–34.0)
MCH: 24.8 pg — ABNORMAL LOW (ref 26.0–34.0)
MCH: 24.9 pg — ABNORMAL LOW (ref 26.0–34.0)
MCH: 24.9 pg — ABNORMAL LOW (ref 26.0–34.0)
MCH: 25 pg — ABNORMAL LOW (ref 26.0–34.0)
MCH: 25 pg — ABNORMAL LOW (ref 26.0–34.0)
MCHC: 32.2 g/dL (ref 30.0–36.0)
MCHC: 32.2 g/dL (ref 30.0–36.0)
MCHC: 32.3 g/dL (ref 30.0–36.0)
MCHC: 32.3 g/dL (ref 30.0–36.0)
MCHC: 32.5 g/dL (ref 30.0–36.0)
MCHC: 32.5 g/dL (ref 30.0–36.0)
MCHC: 32.6 g/dL (ref 30.0–36.0)
MCHC: 32.6 g/dL (ref 30.0–36.0)
MCV: 75.1 fL — ABNORMAL LOW (ref 78.0–100.0)
MCV: 75.8 fL — ABNORMAL LOW (ref 78.0–100.0)
MCV: 76.3 fL — ABNORMAL LOW (ref 78.0–100.0)
MCV: 76.6 fL — ABNORMAL LOW (ref 78.0–100.0)
MCV: 76.8 fL — ABNORMAL LOW (ref 78.0–100.0)
MCV: 76.9 fL — ABNORMAL LOW (ref 78.0–100.0)
MCV: 77.3 fL — ABNORMAL LOW (ref 78.0–100.0)
MCV: 77.7 fL — ABNORMAL LOW (ref 78.0–100.0)
Platelets: 224 10*3/uL (ref 150–400)
Platelets: 235 10*3/uL (ref 150–400)
Platelets: 260 10*3/uL (ref 150–400)
Platelets: 284 10*3/uL (ref 150–400)
Platelets: 307 10*3/uL (ref 150–400)
Platelets: 318 10*3/uL (ref 150–400)
Platelets: 406 10*3/uL — ABNORMAL HIGH (ref 150–400)
Platelets: 79 10*3/uL — ABNORMAL LOW (ref 150–400)
RBC: 3.68 MIL/uL — ABNORMAL LOW (ref 3.87–5.11)
RBC: 4.18 MIL/uL (ref 3.87–5.11)
RBC: 4.22 MIL/uL (ref 3.87–5.11)
RBC: 4.28 MIL/uL (ref 3.87–5.11)
RBC: 4.37 MIL/uL (ref 3.87–5.11)
RBC: 4.38 MIL/uL (ref 3.87–5.11)
RBC: 4.44 MIL/uL (ref 3.87–5.11)
RBC: 4.69 MIL/uL (ref 3.87–5.11)
RDW: 14 % (ref 11.5–15.5)
RDW: 14.9 % (ref 11.5–15.5)
RDW: 14.9 % (ref 11.5–15.5)
RDW: 15.1 % (ref 11.5–15.5)
RDW: 15.1 % (ref 11.5–15.5)
RDW: 15.2 % (ref 11.5–15.5)
RDW: 15.2 % (ref 11.5–15.5)
RDW: 15.4 % (ref 11.5–15.5)
WBC: 10.8 10*3/uL — ABNORMAL HIGH (ref 4.0–10.5)
WBC: 19 10*3/uL — ABNORMAL HIGH (ref 4.0–10.5)
WBC: 4.9 10*3/uL (ref 4.0–10.5)
WBC: 5.1 10*3/uL (ref 4.0–10.5)
WBC: 6 10*3/uL (ref 4.0–10.5)
WBC: 6.9 10*3/uL (ref 4.0–10.5)
WBC: 7.2 10*3/uL (ref 4.0–10.5)
WBC: 8.4 10*3/uL (ref 4.0–10.5)

## 2010-07-17 LAB — BASIC METABOLIC PANEL
BUN: 12 mg/dL (ref 6–23)
BUN: 13 mg/dL (ref 6–23)
BUN: 13 mg/dL (ref 6–23)
BUN: 4 mg/dL — ABNORMAL LOW (ref 6–23)
BUN: 7 mg/dL (ref 6–23)
BUN: 8 mg/dL (ref 6–23)
CO2: 21 mEq/L (ref 19–32)
CO2: 24 mEq/L (ref 19–32)
CO2: 24 mEq/L (ref 19–32)
CO2: 24 mEq/L (ref 19–32)
CO2: 27 mEq/L (ref 19–32)
CO2: 28 mEq/L (ref 19–32)
Calcium: 7.6 mg/dL — ABNORMAL LOW (ref 8.4–10.5)
Calcium: 7.7 mg/dL — ABNORMAL LOW (ref 8.4–10.5)
Calcium: 7.8 mg/dL — ABNORMAL LOW (ref 8.4–10.5)
Calcium: 8 mg/dL — ABNORMAL LOW (ref 8.4–10.5)
Calcium: 8 mg/dL — ABNORMAL LOW (ref 8.4–10.5)
Calcium: 8.7 mg/dL (ref 8.4–10.5)
Chloride: 106 mEq/L (ref 96–112)
Chloride: 107 mEq/L (ref 96–112)
Chloride: 107 mEq/L (ref 96–112)
Chloride: 108 mEq/L (ref 96–112)
Chloride: 108 mEq/L (ref 96–112)
Chloride: 111 mEq/L (ref 96–112)
Creatinine, Ser: 0.45 mg/dL (ref 0.4–1.2)
Creatinine, Ser: 0.49 mg/dL (ref 0.4–1.2)
Creatinine, Ser: 0.49 mg/dL (ref 0.4–1.2)
Creatinine, Ser: 0.52 mg/dL (ref 0.4–1.2)
Creatinine, Ser: 0.53 mg/dL (ref 0.4–1.2)
Creatinine, Ser: 0.53 mg/dL (ref 0.4–1.2)
GFR calc Af Amer: >60 mL/min (ref >60)
GFR calc Af Amer: >60 mL/min (ref >60)
GFR calc Af Amer: >60 mL/min (ref >60)
GFR calc Af Amer: >60 mL/min (ref >60)
GFR calc Af Amer: >60 mL/min (ref >60)
GFR calc Af Amer: >60 mL/min (ref >60)
GFR calc non Af Amer: >60 mL/min (ref >60)
GFR calc non Af Amer: >60 mL/min (ref >60)
GFR calc non Af Amer: >60 mL/min (ref >60)
GFR calc non Af Amer: >60 mL/min (ref >60)
GFR calc non Af Amer: >60 mL/min (ref >60)
GFR calc non Af Amer: >60 mL/min (ref >60)
Glucose, Bld: 112 mg/dL — ABNORMAL HIGH (ref 70–99)
Glucose, Bld: 117 mg/dL — ABNORMAL HIGH (ref 70–99)
Glucose, Bld: 125 mg/dL — ABNORMAL HIGH (ref 70–99)
Glucose, Bld: 70 mg/dL (ref 70–99)
Glucose, Bld: 79 mg/dL (ref 70–99)
Glucose, Bld: 94 mg/dL (ref 70–99)
Potassium: 3.2 mEq/L — ABNORMAL LOW (ref 3.5–5.1)
Potassium: 3.2 mEq/L — ABNORMAL LOW (ref 3.5–5.1)
Potassium: 3.4 mEq/L — ABNORMAL LOW (ref 3.5–5.1)
Potassium: 3.4 mEq/L — ABNORMAL LOW (ref 3.5–5.1)
Potassium: 3.9 mEq/L (ref 3.5–5.1)
Potassium: 4.4 mEq/L (ref 3.5–5.1)
Sodium: 135 mEq/L (ref 135–145)
Sodium: 137 mEq/L (ref 135–145)
Sodium: 138 mEq/L (ref 135–145)
Sodium: 139 mEq/L (ref 135–145)
Sodium: 139 mEq/L (ref 135–145)
Sodium: 140 mEq/L (ref 135–145)

## 2010-07-17 LAB — CULTURE, BLOOD (ROUTINE X 2): Culture: NO GROWTH

## 2010-07-17 LAB — COMPREHENSIVE METABOLIC PANEL
ALT: 23 U/L (ref 0–35)
AST: 18 U/L (ref 0–37)
Albumin: 2.5 g/dL — ABNORMAL LOW (ref 3.5–5.2)
Alkaline Phosphatase: 78 U/L (ref 39–117)
BUN: 9 mg/dL (ref 6–23)
CO2: 25 mEq/L (ref 19–32)
Calcium: 8.1 mg/dL — ABNORMAL LOW (ref 8.4–10.5)
Chloride: 108 mEq/L (ref 96–112)
Creatinine, Ser: 0.51 mg/dL (ref 0.4–1.2)
GFR calc Af Amer: >60 mL/min (ref >60)
GFR calc non Af Amer: >60 mL/min (ref >60)
Glucose, Bld: 79 mg/dL (ref 70–99)
Potassium: 3.6 mEq/L (ref 3.5–5.1)
Sodium: 137 mEq/L (ref 135–145)
Total Bilirubin: 0.4 mg/dL (ref 0.3–1.2)
Total Protein: 5.1 g/dL — ABNORMAL LOW (ref 6.0–8.3)

## 2010-07-17 LAB — URINALYSIS, ROUTINE W REFLEX MICROSCOPIC
Bilirubin Urine: NEGATIVE
Glucose, UA: NEGATIVE mg/dL
Glucose, UA: NEGATIVE mg/dL
Hgb urine dipstick: NEGATIVE
Hgb urine dipstick: NEGATIVE
Ketones, ur: NEGATIVE mg/dL
Ketones, ur: NEGATIVE mg/dL
Nitrite: NEGATIVE
Nitrite: NEGATIVE
Protein, ur: NEGATIVE mg/dL
Protein, ur: NEGATIVE mg/dL
Specific Gravity, Urine: 1.028 (ref 1.005–1.030)
Specific Gravity, Urine: 1.035 — ABNORMAL HIGH (ref 1.005–1.030)
Urobilinogen, UA: 0.2 mg/dL (ref 0.0–1.0)
Urobilinogen, UA: 1 mg/dL (ref 0.0–1.0)
pH: 6 (ref 5.0–8.0)
pH: 6 (ref 5.0–8.0)

## 2010-07-17 LAB — MAGNESIUM
Magnesium: 1.5 mg/dL (ref 1.5–2.5)
Magnesium: 1.7 mg/dL (ref 1.5–2.5)

## 2010-07-17 LAB — CARDIAC PANEL(CRET KIN+CKTOT+MB+TROPI)
CK, MB: 1.4 ng/mL (ref 0.3–4.0)
CK, MB: 2.1 ng/mL (ref 0.3–4.0)
Relative Index: INVALID (ref 0.0–2.5)
Relative Index: INVALID (ref 0.0–2.5)
Total CK: 49 U/L (ref 7–177)
Total CK: 57 U/L (ref 7–177)
Troponin I: 0.03 ng/mL (ref 0.00–0.06)
Troponin I: 0.03 ng/mL (ref 0.00–0.06)

## 2010-07-17 LAB — TROPONIN I: Troponin I: 0.01 ng/mL (ref 0.00–0.06)

## 2010-07-17 LAB — STOOL CULTURE

## 2010-07-17 LAB — CATH TIP CULTURE: Culture: NO GROWTH

## 2010-07-17 LAB — CK TOTAL AND CKMB (NOT AT ARMC)
CK, MB: 1.4 ng/mL (ref 0.3–4.0)
Relative Index: INVALID (ref 0.0–2.5)
Total CK: 75 U/L (ref 7–177)

## 2010-07-17 LAB — HEMOGLOBIN A1C
Hgb A1c MFr Bld: 5.5 % (ref <5.7)
Mean Plasma Glucose: 111 mg/dL (ref <117)

## 2010-07-17 LAB — LIPASE, BLOOD: Lipase: 18 U/L (ref 11–59)

## 2010-07-17 LAB — LACTIC ACID, PLASMA: Lactic Acid, Venous: 1.1 mmol/L (ref 0.5–2.2)

## 2010-07-20 ENCOUNTER — Emergency Department (HOSPITAL_COMMUNITY): Payer: Medicare Other

## 2010-07-20 ENCOUNTER — Emergency Department (HOSPITAL_COMMUNITY)
Admission: EM | Admit: 2010-07-20 | Discharge: 2010-07-20 | Disposition: A | Discharge status: Home / Self Care | Payer: Medicare Other | Attending: Emergency Medicine | Admitting: Emergency Medicine

## 2010-07-20 DIAGNOSIS — K219 Gastro-esophageal reflux disease without esophagitis: Secondary | ICD-10-CM | POA: Insufficient documentation

## 2010-07-20 DIAGNOSIS — E119 Type 2 diabetes mellitus without complications: Secondary | ICD-10-CM | POA: Insufficient documentation

## 2010-07-20 DIAGNOSIS — R112 Nausea with vomiting, unspecified: Secondary | ICD-10-CM | POA: Insufficient documentation

## 2010-07-20 DIAGNOSIS — K3184 Gastroparesis: Secondary | ICD-10-CM | POA: Insufficient documentation

## 2010-07-20 DIAGNOSIS — R109 Unspecified abdominal pain: Secondary | ICD-10-CM | POA: Insufficient documentation

## 2010-07-20 DIAGNOSIS — I10 Essential (primary) hypertension: Secondary | ICD-10-CM | POA: Insufficient documentation

## 2010-07-20 LAB — PREALBUMIN
Prealbumin: 22.1 mg/dL (ref 18.0–45.0)
Prealbumin: 29.1 mg/dL (ref 18.0–45.0)
Prealbumin: 23.7 mg/dL (ref 18.0–45.0)

## 2010-07-20 LAB — GLUCOSE, CAPILLARY
Glucose-Capillary: 100 mg/dL — ABNORMAL HIGH (ref 70–99)
Glucose-Capillary: 104 mg/dL — ABNORMAL HIGH (ref 70–99)
Glucose-Capillary: 110 mg/dL — ABNORMAL HIGH (ref 70–99)
Glucose-Capillary: 113 mg/dL — ABNORMAL HIGH (ref 70–99)
Glucose-Capillary: 114 mg/dL — ABNORMAL HIGH (ref 70–99)
Glucose-Capillary: 116 mg/dL — ABNORMAL HIGH (ref 70–99)
Glucose-Capillary: 119 mg/dL — ABNORMAL HIGH (ref 70–99)
Glucose-Capillary: 119 mg/dL — ABNORMAL HIGH (ref 70–99)
Glucose-Capillary: 120 mg/dL — ABNORMAL HIGH (ref 70–99)
Glucose-Capillary: 121 mg/dL — ABNORMAL HIGH (ref 70–99)
Glucose-Capillary: 123 mg/dL — ABNORMAL HIGH (ref 70–99)
Glucose-Capillary: 128 mg/dL — ABNORMAL HIGH (ref 70–99)
Glucose-Capillary: 133 mg/dL — ABNORMAL HIGH (ref 70–99)
Glucose-Capillary: 140 mg/dL — ABNORMAL HIGH (ref 70–99)
Glucose-Capillary: 146 mg/dL — ABNORMAL HIGH (ref 70–99)
Glucose-Capillary: 154 mg/dL — ABNORMAL HIGH (ref 70–99)
Glucose-Capillary: 96 mg/dL (ref 70–99)
Glucose-Capillary: 100 mg/dL — ABNORMAL HIGH (ref 70–99)
Glucose-Capillary: 103 mg/dL — ABNORMAL HIGH (ref 70–99)
Glucose-Capillary: 104 mg/dL — ABNORMAL HIGH (ref 70–99)
Glucose-Capillary: 105 mg/dL — ABNORMAL HIGH (ref 70–99)
Glucose-Capillary: 106 mg/dL — ABNORMAL HIGH (ref 70–99)
Glucose-Capillary: 107 mg/dL — ABNORMAL HIGH (ref 70–99)
Glucose-Capillary: 107 mg/dL — ABNORMAL HIGH (ref 70–99)
Glucose-Capillary: 110 mg/dL — ABNORMAL HIGH (ref 70–99)
Glucose-Capillary: 112 mg/dL — ABNORMAL HIGH (ref 70–99)
Glucose-Capillary: 113 mg/dL — ABNORMAL HIGH (ref 70–99)
Glucose-Capillary: 114 mg/dL — ABNORMAL HIGH (ref 70–99)
Glucose-Capillary: 116 mg/dL — ABNORMAL HIGH (ref 70–99)
Glucose-Capillary: 117 mg/dL — ABNORMAL HIGH (ref 70–99)
Glucose-Capillary: 117 mg/dL — ABNORMAL HIGH (ref 70–99)
Glucose-Capillary: 117 mg/dL — ABNORMAL HIGH (ref 70–99)
Glucose-Capillary: 117 mg/dL — ABNORMAL HIGH (ref 70–99)
Glucose-Capillary: 118 mg/dL — ABNORMAL HIGH (ref 70–99)
Glucose-Capillary: 118 mg/dL — ABNORMAL HIGH (ref 70–99)
Glucose-Capillary: 119 mg/dL — ABNORMAL HIGH (ref 70–99)
Glucose-Capillary: 120 mg/dL — ABNORMAL HIGH (ref 70–99)
Glucose-Capillary: 121 mg/dL — ABNORMAL HIGH (ref 70–99)
Glucose-Capillary: 121 mg/dL — ABNORMAL HIGH (ref 70–99)
Glucose-Capillary: 121 mg/dL — ABNORMAL HIGH (ref 70–99)
Glucose-Capillary: 121 mg/dL — ABNORMAL HIGH (ref 70–99)
Glucose-Capillary: 123 mg/dL — ABNORMAL HIGH (ref 70–99)
Glucose-Capillary: 123 mg/dL — ABNORMAL HIGH (ref 70–99)
Glucose-Capillary: 125 mg/dL — ABNORMAL HIGH (ref 70–99)
Glucose-Capillary: 125 mg/dL — ABNORMAL HIGH (ref 70–99)
Glucose-Capillary: 125 mg/dL — ABNORMAL HIGH (ref 70–99)
Glucose-Capillary: 126 mg/dL — ABNORMAL HIGH (ref 70–99)
Glucose-Capillary: 127 mg/dL — ABNORMAL HIGH (ref 70–99)
Glucose-Capillary: 127 mg/dL — ABNORMAL HIGH (ref 70–99)
Glucose-Capillary: 128 mg/dL — ABNORMAL HIGH (ref 70–99)
Glucose-Capillary: 129 mg/dL — ABNORMAL HIGH (ref 70–99)
Glucose-Capillary: 129 mg/dL — ABNORMAL HIGH (ref 70–99)
Glucose-Capillary: 129 mg/dL — ABNORMAL HIGH (ref 70–99)
Glucose-Capillary: 130 mg/dL — ABNORMAL HIGH (ref 70–99)
Glucose-Capillary: 130 mg/dL — ABNORMAL HIGH (ref 70–99)
Glucose-Capillary: 131 mg/dL — ABNORMAL HIGH (ref 70–99)
Glucose-Capillary: 131 mg/dL — ABNORMAL HIGH (ref 70–99)
Glucose-Capillary: 131 mg/dL — ABNORMAL HIGH (ref 70–99)
Glucose-Capillary: 132 mg/dL — ABNORMAL HIGH (ref 70–99)
Glucose-Capillary: 133 mg/dL — ABNORMAL HIGH (ref 70–99)
Glucose-Capillary: 133 mg/dL — ABNORMAL HIGH (ref 70–99)
Glucose-Capillary: 133 mg/dL — ABNORMAL HIGH (ref 70–99)
Glucose-Capillary: 134 mg/dL — ABNORMAL HIGH (ref 70–99)
Glucose-Capillary: 136 mg/dL — ABNORMAL HIGH (ref 70–99)
Glucose-Capillary: 136 mg/dL — ABNORMAL HIGH (ref 70–99)
Glucose-Capillary: 136 mg/dL — ABNORMAL HIGH (ref 70–99)
Glucose-Capillary: 136 mg/dL — ABNORMAL HIGH (ref 70–99)
Glucose-Capillary: 136 mg/dL — ABNORMAL HIGH (ref 70–99)
Glucose-Capillary: 136 mg/dL — ABNORMAL HIGH (ref 70–99)
Glucose-Capillary: 137 mg/dL — ABNORMAL HIGH (ref 70–99)
Glucose-Capillary: 137 mg/dL — ABNORMAL HIGH (ref 70–99)
Glucose-Capillary: 138 mg/dL — ABNORMAL HIGH (ref 70–99)
Glucose-Capillary: 139 mg/dL — ABNORMAL HIGH (ref 70–99)
Glucose-Capillary: 141 mg/dL — ABNORMAL HIGH (ref 70–99)
Glucose-Capillary: 141 mg/dL — ABNORMAL HIGH (ref 70–99)
Glucose-Capillary: 141 mg/dL — ABNORMAL HIGH (ref 70–99)
Glucose-Capillary: 141 mg/dL — ABNORMAL HIGH (ref 70–99)
Glucose-Capillary: 142 mg/dL — ABNORMAL HIGH (ref 70–99)
Glucose-Capillary: 143 mg/dL — ABNORMAL HIGH (ref 70–99)
Glucose-Capillary: 146 mg/dL — ABNORMAL HIGH (ref 70–99)
Glucose-Capillary: 149 mg/dL — ABNORMAL HIGH (ref 70–99)
Glucose-Capillary: 156 mg/dL — ABNORMAL HIGH (ref 70–99)
Glucose-Capillary: 156 mg/dL — ABNORMAL HIGH (ref 70–99)
Glucose-Capillary: 158 mg/dL — ABNORMAL HIGH (ref 70–99)
Glucose-Capillary: 160 mg/dL — ABNORMAL HIGH (ref 70–99)
Glucose-Capillary: 163 mg/dL — ABNORMAL HIGH (ref 70–99)
Glucose-Capillary: 169 mg/dL — ABNORMAL HIGH (ref 70–99)
Glucose-Capillary: 170 mg/dL — ABNORMAL HIGH (ref 70–99)
Glucose-Capillary: 170 mg/dL — ABNORMAL HIGH (ref 70–99)
Glucose-Capillary: 173 mg/dL — ABNORMAL HIGH (ref 70–99)
Glucose-Capillary: 177 mg/dL — ABNORMAL HIGH (ref 70–99)
Glucose-Capillary: 187 mg/dL — ABNORMAL HIGH (ref 70–99)
Glucose-Capillary: 198 mg/dL — ABNORMAL HIGH (ref 70–99)
Glucose-Capillary: 81 mg/dL (ref 70–99)
Glucose-Capillary: 83 mg/dL (ref 70–99)
Glucose-Capillary: 88 mg/dL (ref 70–99)
Glucose-Capillary: 89 mg/dL (ref 70–99)
Glucose-Capillary: 89 mg/dL (ref 70–99)
Glucose-Capillary: 90 mg/dL (ref 70–99)
Glucose-Capillary: 92 mg/dL (ref 70–99)
Glucose-Capillary: 92 mg/dL (ref 70–99)
Glucose-Capillary: 94 mg/dL (ref 70–99)
Glucose-Capillary: 96 mg/dL (ref 70–99)
Glucose-Capillary: 97 mg/dL (ref 70–99)
Glucose-Capillary: 98 mg/dL (ref 70–99)
Glucose-Capillary: 98 mg/dL (ref 70–99)
Glucose-Capillary: 99 mg/dL (ref 70–99)
Glucose-Capillary: 102 mg/dL — ABNORMAL HIGH (ref 70–99)
Glucose-Capillary: 104 mg/dL — ABNORMAL HIGH (ref 70–99)
Glucose-Capillary: 105 mg/dL — ABNORMAL HIGH (ref 70–99)
Glucose-Capillary: 106 mg/dL — ABNORMAL HIGH (ref 70–99)
Glucose-Capillary: 107 mg/dL — ABNORMAL HIGH (ref 70–99)
Glucose-Capillary: 108 mg/dL — ABNORMAL HIGH (ref 70–99)
Glucose-Capillary: 109 mg/dL — ABNORMAL HIGH (ref 70–99)
Glucose-Capillary: 109 mg/dL — ABNORMAL HIGH (ref 70–99)
Glucose-Capillary: 110 mg/dL — ABNORMAL HIGH (ref 70–99)
Glucose-Capillary: 111 mg/dL — ABNORMAL HIGH (ref 70–99)
Glucose-Capillary: 112 mg/dL — ABNORMAL HIGH (ref 70–99)
Glucose-Capillary: 112 mg/dL — ABNORMAL HIGH (ref 70–99)
Glucose-Capillary: 112 mg/dL — ABNORMAL HIGH (ref 70–99)
Glucose-Capillary: 113 mg/dL — ABNORMAL HIGH (ref 70–99)
Glucose-Capillary: 113 mg/dL — ABNORMAL HIGH (ref 70–99)
Glucose-Capillary: 113 mg/dL — ABNORMAL HIGH (ref 70–99)
Glucose-Capillary: 114 mg/dL — ABNORMAL HIGH (ref 70–99)
Glucose-Capillary: 115 mg/dL — ABNORMAL HIGH (ref 70–99)
Glucose-Capillary: 115 mg/dL — ABNORMAL HIGH (ref 70–99)
Glucose-Capillary: 115 mg/dL — ABNORMAL HIGH (ref 70–99)
Glucose-Capillary: 115 mg/dL — ABNORMAL HIGH (ref 70–99)
Glucose-Capillary: 116 mg/dL — ABNORMAL HIGH (ref 70–99)
Glucose-Capillary: 116 mg/dL — ABNORMAL HIGH (ref 70–99)
Glucose-Capillary: 117 mg/dL — ABNORMAL HIGH (ref 70–99)
Glucose-Capillary: 118 mg/dL — ABNORMAL HIGH (ref 70–99)
Glucose-Capillary: 118 mg/dL — ABNORMAL HIGH (ref 70–99)
Glucose-Capillary: 118 mg/dL — ABNORMAL HIGH (ref 70–99)
Glucose-Capillary: 118 mg/dL — ABNORMAL HIGH (ref 70–99)
Glucose-Capillary: 119 mg/dL — ABNORMAL HIGH (ref 70–99)
Glucose-Capillary: 119 mg/dL — ABNORMAL HIGH (ref 70–99)
Glucose-Capillary: 120 mg/dL — ABNORMAL HIGH (ref 70–99)
Glucose-Capillary: 120 mg/dL — ABNORMAL HIGH (ref 70–99)
Glucose-Capillary: 120 mg/dL — ABNORMAL HIGH (ref 70–99)
Glucose-Capillary: 121 mg/dL — ABNORMAL HIGH (ref 70–99)
Glucose-Capillary: 121 mg/dL — ABNORMAL HIGH (ref 70–99)
Glucose-Capillary: 123 mg/dL — ABNORMAL HIGH (ref 70–99)
Glucose-Capillary: 124 mg/dL — ABNORMAL HIGH (ref 70–99)
Glucose-Capillary: 125 mg/dL — ABNORMAL HIGH (ref 70–99)
Glucose-Capillary: 125 mg/dL — ABNORMAL HIGH (ref 70–99)
Glucose-Capillary: 128 mg/dL — ABNORMAL HIGH (ref 70–99)
Glucose-Capillary: 128 mg/dL — ABNORMAL HIGH (ref 70–99)
Glucose-Capillary: 129 mg/dL — ABNORMAL HIGH (ref 70–99)
Glucose-Capillary: 129 mg/dL — ABNORMAL HIGH (ref 70–99)
Glucose-Capillary: 129 mg/dL — ABNORMAL HIGH (ref 70–99)
Glucose-Capillary: 130 mg/dL — ABNORMAL HIGH (ref 70–99)
Glucose-Capillary: 130 mg/dL — ABNORMAL HIGH (ref 70–99)
Glucose-Capillary: 130 mg/dL — ABNORMAL HIGH (ref 70–99)
Glucose-Capillary: 131 mg/dL — ABNORMAL HIGH (ref 70–99)
Glucose-Capillary: 132 mg/dL — ABNORMAL HIGH (ref 70–99)
Glucose-Capillary: 132 mg/dL — ABNORMAL HIGH (ref 70–99)
Glucose-Capillary: 132 mg/dL — ABNORMAL HIGH (ref 70–99)
Glucose-Capillary: 132 mg/dL — ABNORMAL HIGH (ref 70–99)
Glucose-Capillary: 134 mg/dL — ABNORMAL HIGH (ref 70–99)
Glucose-Capillary: 135 mg/dL — ABNORMAL HIGH (ref 70–99)
Glucose-Capillary: 135 mg/dL — ABNORMAL HIGH (ref 70–99)
Glucose-Capillary: 137 mg/dL — ABNORMAL HIGH (ref 70–99)
Glucose-Capillary: 138 mg/dL — ABNORMAL HIGH (ref 70–99)
Glucose-Capillary: 138 mg/dL — ABNORMAL HIGH (ref 70–99)
Glucose-Capillary: 138 mg/dL — ABNORMAL HIGH (ref 70–99)
Glucose-Capillary: 141 mg/dL — ABNORMAL HIGH (ref 70–99)
Glucose-Capillary: 143 mg/dL — ABNORMAL HIGH (ref 70–99)
Glucose-Capillary: 145 mg/dL — ABNORMAL HIGH (ref 70–99)
Glucose-Capillary: 150 mg/dL — ABNORMAL HIGH (ref 70–99)
Glucose-Capillary: 160 mg/dL — ABNORMAL HIGH (ref 70–99)
Glucose-Capillary: 80 mg/dL (ref 70–99)
Glucose-Capillary: 84 mg/dL (ref 70–99)
Glucose-Capillary: 86 mg/dL (ref 70–99)
Glucose-Capillary: 89 mg/dL (ref 70–99)
Glucose-Capillary: 91 mg/dL (ref 70–99)
Glucose-Capillary: 92 mg/dL (ref 70–99)
Glucose-Capillary: 92 mg/dL (ref 70–99)
Glucose-Capillary: 94 mg/dL (ref 70–99)
Glucose-Capillary: 96 mg/dL (ref 70–99)
Glucose-Capillary: 96 mg/dL (ref 70–99)
Glucose-Capillary: 96 mg/dL (ref 70–99)
Glucose-Capillary: 97 mg/dL (ref 70–99)
Glucose-Capillary: 117 mg/dL — ABNORMAL HIGH (ref 70–99)
Glucose-Capillary: 119 mg/dL — ABNORMAL HIGH (ref 70–99)
Glucose-Capillary: 130 mg/dL — ABNORMAL HIGH (ref 70–99)
Glucose-Capillary: 134 mg/dL — ABNORMAL HIGH (ref 70–99)
Glucose-Capillary: 99 mg/dL (ref 70–99)

## 2010-07-20 LAB — COMPREHENSIVE METABOLIC PANEL
ALT: 35 U/L (ref 0–35)
ALT: 13 U/L (ref 0–35)
ALT: 33 U/L (ref 0–35)
ALT: 8 U/L (ref 0–35)
AST: 22 U/L (ref 0–37)
AST: 17 U/L (ref 0–37)
AST: 12 U/L (ref 0–37)
AST: 51 U/L — ABNORMAL HIGH (ref 0–37)
Albumin: 3.2 g/dL — ABNORMAL LOW (ref 3.5–5.2)
Albumin: 2.8 g/dL — ABNORMAL LOW (ref 3.5–5.2)
Albumin: 2.6 g/dL — ABNORMAL LOW (ref 3.5–5.2)
Albumin: 4 g/dL (ref 3.5–5.2)
Alkaline Phosphatase: 98 U/L (ref 39–117)
Alkaline Phosphatase: 73 U/L (ref 39–117)
Alkaline Phosphatase: 70 U/L (ref 39–117)
Alkaline Phosphatase: 76 U/L (ref 39–117)
BUN: 13 mg/dL (ref 6–23)
BUN: 13 mg/dL (ref 6–23)
BUN: 11 mg/dL (ref 6–23)
BUN: 29 mg/dL — ABNORMAL HIGH (ref 6–23)
CO2: 28 mEq/L (ref 19–32)
CO2: 28 mEq/L (ref 19–32)
CO2: 26 mEq/L (ref 19–32)
CO2: 32 mEq/L (ref 19–32)
Calcium: 8.7 mg/dL (ref 8.4–10.5)
Calcium: 8.9 mg/dL (ref 8.4–10.5)
Calcium: 8.8 mg/dL (ref 8.4–10.5)
Calcium: 9.6 mg/dL (ref 8.4–10.5)
Chloride: 101 mEq/L (ref 96–112)
Chloride: 101 mEq/L (ref 96–112)
Chloride: 106 mEq/L (ref 96–112)
Chloride: 97 mEq/L (ref 96–112)
Creatinine, Ser: 0.54 mg/dL (ref 0.4–1.2)
Creatinine, Ser: 0.68 mg/dL (ref 0.4–1.2)
Creatinine, Ser: 0.41 mg/dL (ref 0.4–1.2)
Creatinine, Ser: 0.83 mg/dL (ref 0.4–1.2)
GFR calc Af Amer: >60 mL/min (ref >60)
GFR calc Af Amer: >60 mL/min (ref >60)
GFR calc Af Amer: >60 mL/min (ref >60)
GFR calc Af Amer: >60 mL/min (ref >60)
GFR calc non Af Amer: >60 mL/min (ref >60)
GFR calc non Af Amer: >60 mL/min (ref >60)
GFR calc non Af Amer: >60 mL/min (ref >60)
GFR calc non Af Amer: >60 mL/min (ref >60)
Glucose, Bld: 108 mg/dL — ABNORMAL HIGH (ref 70–99)
Glucose, Bld: 113 mg/dL — ABNORMAL HIGH (ref 70–99)
Glucose, Bld: 126 mg/dL — ABNORMAL HIGH (ref 70–99)
Glucose, Bld: 84 mg/dL (ref 70–99)
Potassium: 4.3 mEq/L (ref 3.5–5.1)
Potassium: 4.1 mEq/L (ref 3.5–5.1)
Potassium: 3.5 mEq/L (ref 3.5–5.1)
Potassium: 4.3 mEq/L (ref 3.5–5.1)
Sodium: 139 mEq/L (ref 135–145)
Sodium: 138 mEq/L (ref 135–145)
Sodium: 136 mEq/L (ref 135–145)
Sodium: 144 mEq/L (ref 135–145)
Total Bilirubin: 0.7 mg/dL (ref 0.3–1.2)
Total Bilirubin: 0.5 mg/dL (ref 0.3–1.2)
Total Bilirubin: 0.3 mg/dL (ref 0.3–1.2)
Total Bilirubin: 0.3 mg/dL (ref 0.3–1.2)
Total Protein: 6.1 g/dL (ref 6.0–8.3)
Total Protein: 5.8 g/dL — ABNORMAL LOW (ref 6.0–8.3)
Total Protein: 6.9 g/dL (ref 6.0–8.3)
Total Protein: 7.5 g/dL (ref 6.0–8.3)

## 2010-07-20 LAB — CBC
HCT: 28.7 % — ABNORMAL LOW (ref 36.0–46.0)
HCT: 29.3 % — ABNORMAL LOW (ref 36.0–46.0)
HCT: 32.1 % — ABNORMAL LOW (ref 36.0–46.0)
HCT: 32.5 % — ABNORMAL LOW (ref 36.0–46.0)
HCT: 35.4 % — ABNORMAL LOW (ref 36.0–46.0)
HCT: 40.2 % (ref 36.0–46.0)
HCT: 31.8 % — ABNORMAL LOW (ref 36.0–46.0)
HCT: 32.7 % — ABNORMAL LOW (ref 36.0–46.0)
HCT: 33.8 % — ABNORMAL LOW (ref 36.0–46.0)
HCT: 34.7 % — ABNORMAL LOW (ref 36.0–46.0)
HCT: 30.1 % — ABNORMAL LOW (ref 36.0–46.0)
HCT: 36.2 % (ref 36.0–46.0)
HCT: 39.7 % (ref 36.0–46.0)
Hemoglobin: 10.3 g/dL — ABNORMAL LOW (ref 12.0–15.0)
Hemoglobin: 10.4 g/dL — ABNORMAL LOW (ref 12.0–15.0)
Hemoglobin: 11.2 g/dL — ABNORMAL LOW (ref 12.0–15.0)
Hemoglobin: 12.7 g/dL (ref 12.0–15.0)
Hemoglobin: 9.3 g/dL — ABNORMAL LOW (ref 12.0–15.0)
Hemoglobin: 9.4 g/dL — ABNORMAL LOW (ref 12.0–15.0)
Hemoglobin: 10.3 g/dL — ABNORMAL LOW (ref 12.0–15.0)
Hemoglobin: 10.4 g/dL — ABNORMAL LOW (ref 12.0–15.0)
Hemoglobin: 10.9 g/dL — ABNORMAL LOW (ref 12.0–15.0)
Hemoglobin: 10.6 g/dL — ABNORMAL LOW (ref 12.0–15.0)
Hemoglobin: 11.6 g/dL — ABNORMAL LOW (ref 12.0–15.0)
Hemoglobin: 12.8 g/dL (ref 12.0–15.0)
Hemoglobin: 9.8 g/dL — ABNORMAL LOW (ref 12.0–15.0)
MCH: 24.7 pg — ABNORMAL LOW (ref 26.0–34.0)
MCH: 24.8 pg — ABNORMAL LOW (ref 26.0–34.0)
MCH: 25 pg — ABNORMAL LOW (ref 26.0–34.0)
MCHC: 31.6 g/dL (ref 30.0–36.0)
MCHC: 31.6 g/dL (ref 30.0–36.0)
MCHC: 32 g/dL (ref 30.0–36.0)
MCHC: 32 g/dL (ref 30.0–36.0)
MCHC: 32.1 g/dL (ref 30.0–36.0)
MCHC: 32.2 g/dL (ref 30.0–36.0)
MCHC: 32 g/dL (ref 30.0–36.0)
MCHC: 32.3 g/dL (ref 30.0–36.0)
MCHC: 32.6 g/dL (ref 30.0–36.0)
MCHC: 30.5 g/dL (ref 30.0–36.0)
MCHC: 32.1 g/dL (ref 30.0–36.0)
MCHC: 32.3 g/dL (ref 30.0–36.0)
MCHC: 32.5 g/dL (ref 30.0–36.0)
MCV: 78.2 fL (ref 78.0–100.0)
MCV: 78.9 fL (ref 78.0–100.0)
MCV: 79.1 fL (ref 78.0–100.0)
MCV: 79.2 fL (ref 78.0–100.0)
MCV: 79.7 fL (ref 78.0–100.0)
MCV: 79.7 fL (ref 78.0–100.0)
MCV: 79.4 fL (ref 78.0–100.0)
MCV: 79.6 fL (ref 78.0–100.0)
MCV: 80.3 fL (ref 78.0–100.0)
MCV: 80.7 fL (ref 78.0–100.0)
MCV: 76.8 fL — ABNORMAL LOW (ref 78.0–100.0)
MCV: 77.3 fL — ABNORMAL LOW (ref 78.0–100.0)
MCV: 77.9 fL — ABNORMAL LOW (ref 78.0–100.0)
Platelets: 237 10*3/uL (ref 150–400)
Platelets: 270 10*3/uL (ref 150–400)
Platelets: 295 10*3/uL (ref 150–400)
Platelets: 327 10*3/uL (ref 150–400)
Platelets: 328 10*3/uL (ref 150–400)
Platelets: 368 10*3/uL (ref 150–400)
Platelets: 236 10*3/uL (ref 150–400)
Platelets: 246 10*3/uL (ref 150–400)
Platelets: 257 10*3/uL (ref 150–400)
Platelets: 564 10*3/uL — ABNORMAL HIGH (ref 150–400)
Platelets: 359 10*3/uL (ref 150–400)
Platelets: 443 10*3/uL — ABNORMAL HIGH (ref 150–400)
Platelets: 527 10*3/uL — ABNORMAL HIGH (ref 150–400)
RBC: 3.67 MIL/uL — ABNORMAL LOW (ref 3.87–5.11)
RBC: 3.7 MIL/uL — ABNORMAL LOW (ref 3.87–5.11)
RBC: 4.03 MIL/uL (ref 3.87–5.11)
RBC: 4.12 MIL/uL (ref 3.87–5.11)
RBC: 4.47 MIL/uL (ref 3.87–5.11)
RBC: 5.04 MIL/uL (ref 3.87–5.11)
RBC: 4 MIL/uL (ref 3.87–5.11)
RBC: 4.1 MIL/uL (ref 3.87–5.11)
RBC: 4.22 MIL/uL (ref 3.87–5.11)
RBC: 4.3 MIL/uL (ref 3.87–5.11)
RBC: 3.92 MIL/uL (ref 3.87–5.11)
RBC: 4.68 MIL/uL (ref 3.87–5.11)
RBC: 5.09 MIL/uL (ref 3.87–5.11)
RDW: 14.2 % (ref 11.5–15.5)
RDW: 14.3 % (ref 11.5–15.5)
RDW: 14.4 % (ref 11.5–15.5)
RDW: 14.6 % (ref 11.5–15.5)
RDW: 14.9 % (ref 11.5–15.5)
RDW: 15.2 % (ref 11.5–15.5)
RDW: 15.2 % (ref 11.5–15.5)
RDW: 15.6 % — ABNORMAL HIGH (ref 11.5–15.5)
RDW: 15.7 % — ABNORMAL HIGH (ref 11.5–15.5)
RDW: 15 % (ref 11.5–15.5)
RDW: 13.9 % (ref 11.5–15.5)
RDW: 15.3 % (ref 11.5–15.5)
RDW: 15.3 % (ref 11.5–15.5)
WBC: 11.9 10*3/uL — ABNORMAL HIGH (ref 4.0–10.5)
WBC: 12 10*3/uL — ABNORMAL HIGH (ref 4.0–10.5)
WBC: 15.6 10*3/uL — ABNORMAL HIGH (ref 4.0–10.5)
WBC: 9.3 10*3/uL (ref 4.0–10.5)
WBC: 9.6 10*3/uL (ref 4.0–10.5)
WBC: 9.9 10*3/uL (ref 4.0–10.5)
WBC: 7.4 10*3/uL (ref 4.0–10.5)
WBC: 7.9 10*3/uL (ref 4.0–10.5)
WBC: 9.3 10*3/uL (ref 4.0–10.5)
WBC: 17.1 10*3/uL — ABNORMAL HIGH (ref 4.0–10.5)
WBC: 11.9 10*3/uL — ABNORMAL HIGH (ref 4.0–10.5)
WBC: 12.7 10*3/uL — ABNORMAL HIGH (ref 4.0–10.5)
WBC: 16.1 10*3/uL — ABNORMAL HIGH (ref 4.0–10.5)

## 2010-07-20 LAB — URINE CULTURE
Colony Count: 25000
Colony Count: 50000

## 2010-07-20 LAB — BASIC METABOLIC PANEL
BUN: 11 mg/dL (ref 6–23)
BUN: 13 mg/dL (ref 6–23)
BUN: 14 mg/dL (ref 6–23)
BUN: 19 mg/dL (ref 6–23)
BUN: 29 mg/dL — ABNORMAL HIGH (ref 6–23)
BUN: 35 mg/dL — ABNORMAL HIGH (ref 6–23)
BUN: 8 mg/dL (ref 6–23)
BUN: 8 mg/dL (ref 6–23)
BUN: 32 mg/dL — ABNORMAL HIGH (ref 6–23)
BUN: 36 mg/dL — ABNORMAL HIGH (ref 6–23)
CO2: 24 mEq/L (ref 19–32)
CO2: 25 mEq/L (ref 19–32)
CO2: 25 mEq/L (ref 19–32)
CO2: 26 mEq/L (ref 19–32)
CO2: 27 mEq/L (ref 19–32)
CO2: 28 mEq/L (ref 19–32)
CO2: 28 mEq/L (ref 19–32)
CO2: 30 mEq/L (ref 19–32)
CO2: 23 mEq/L (ref 19–32)
CO2: 24 mEq/L (ref 19–32)
Calcium: 8.4 mg/dL (ref 8.4–10.5)
Calcium: 8.6 mg/dL (ref 8.4–10.5)
Calcium: 8.8 mg/dL (ref 8.4–10.5)
Calcium: 9 mg/dL (ref 8.4–10.5)
Calcium: 9.1 mg/dL (ref 8.4–10.5)
Calcium: 9.2 mg/dL (ref 8.4–10.5)
Calcium: 9.7 mg/dL (ref 8.4–10.5)
Calcium: 8.8 mg/dL (ref 8.4–10.5)
Calcium: 8.5 mg/dL (ref 8.4–10.5)
Calcium: 9.3 mg/dL (ref 8.4–10.5)
Chloride: 100 mEq/L (ref 96–112)
Chloride: 101 mEq/L (ref 96–112)
Chloride: 101 mEq/L (ref 96–112)
Chloride: 102 mEq/L (ref 96–112)
Chloride: 107 mEq/L (ref 96–112)
Chloride: 96 mEq/L (ref 96–112)
Chloride: 98 mEq/L (ref 96–112)
Chloride: 98 mEq/L (ref 96–112)
Chloride: 108 mEq/L (ref 96–112)
Chloride: 111 mEq/L (ref 96–112)
Creatinine, Ser: 0.44 mg/dL (ref 0.4–1.2)
Creatinine, Ser: 0.58 mg/dL (ref 0.4–1.2)
Creatinine, Ser: 0.61 mg/dL (ref 0.4–1.2)
Creatinine, Ser: 0.64 mg/dL (ref 0.4–1.2)
Creatinine, Ser: 0.69 mg/dL (ref 0.4–1.2)
Creatinine, Ser: 0.69 mg/dL (ref 0.4–1.2)
Creatinine, Ser: 0.98 mg/dL (ref 0.4–1.2)
Creatinine, Ser: 0.6 mg/dL (ref 0.4–1.2)
Creatinine, Ser: 0.66 mg/dL (ref 0.4–1.2)
Creatinine, Ser: 0.86 mg/dL (ref 0.4–1.2)
GFR calc Af Amer: >60 mL/min (ref >60)
GFR calc Af Amer: >60 mL/min (ref >60)
GFR calc Af Amer: >60 mL/min (ref >60)
GFR calc Af Amer: >60 mL/min (ref >60)
GFR calc Af Amer: >60 mL/min (ref >60)
GFR calc Af Amer: >60 mL/min (ref >60)
GFR calc Af Amer: >60 mL/min (ref >60)
GFR calc Af Amer: >60 mL/min (ref >60)
GFR calc Af Amer: >60 mL/min (ref >60)
GFR calc Af Amer: >60 mL/min (ref >60)
GFR calc non Af Amer: 56 mL/min — ABNORMAL LOW (ref >60)
GFR calc non Af Amer: >60 mL/min (ref >60)
GFR calc non Af Amer: >60 mL/min (ref >60)
GFR calc non Af Amer: >60 mL/min (ref >60)
GFR calc non Af Amer: >60 mL/min (ref >60)
GFR calc non Af Amer: >60 mL/min (ref >60)
GFR calc non Af Amer: >60 mL/min (ref >60)
GFR calc non Af Amer: >60 mL/min (ref >60)
GFR calc non Af Amer: >60 mL/min (ref >60)
GFR calc non Af Amer: >60 mL/min (ref >60)
Glucose, Bld: 105 mg/dL — ABNORMAL HIGH (ref 70–99)
Glucose, Bld: 106 mg/dL — ABNORMAL HIGH (ref 70–99)
Glucose, Bld: 110 mg/dL — ABNORMAL HIGH (ref 70–99)
Glucose, Bld: 130 mg/dL — ABNORMAL HIGH (ref 70–99)
Glucose, Bld: 134 mg/dL — ABNORMAL HIGH (ref 70–99)
Glucose, Bld: 146 mg/dL — ABNORMAL HIGH (ref 70–99)
Glucose, Bld: 156 mg/dL — ABNORMAL HIGH (ref 70–99)
Glucose, Bld: 122 mg/dL — ABNORMAL HIGH (ref 70–99)
Glucose, Bld: 121 mg/dL — ABNORMAL HIGH (ref 70–99)
Glucose, Bld: 148 mg/dL — ABNORMAL HIGH (ref 70–99)
Potassium: 3.1 mEq/L — ABNORMAL LOW (ref 3.5–5.1)
Potassium: 3.5 mEq/L (ref 3.5–5.1)
Potassium: 3.9 mEq/L (ref 3.5–5.1)
Potassium: 4.2 mEq/L (ref 3.5–5.1)
Potassium: 4.3 mEq/L (ref 3.5–5.1)
Potassium: 4.3 mEq/L (ref 3.5–5.1)
Potassium: 4.6 mEq/L (ref 3.5–5.1)
Potassium: 3.9 mEq/L (ref 3.5–5.1)
Potassium: 4.2 mEq/L (ref 3.5–5.1)
Potassium: 4.2 mEq/L (ref 3.5–5.1)
Sodium: 134 mEq/L — ABNORMAL LOW (ref 135–145)
Sodium: 135 mEq/L (ref 135–145)
Sodium: 135 mEq/L (ref 135–145)
Sodium: 136 mEq/L (ref 135–145)
Sodium: 138 mEq/L (ref 135–145)
Sodium: 139 mEq/L (ref 135–145)
Sodium: 141 mEq/L (ref 135–145)
Sodium: 135 mEq/L (ref 135–145)
Sodium: 143 mEq/L (ref 135–145)
Sodium: 143 mEq/L (ref 135–145)

## 2010-07-20 LAB — RENAL FUNCTION PANEL
Albumin: 2.3 g/dL — ABNORMAL LOW (ref 3.5–5.2)
BUN: 14 mg/dL (ref 6–23)
CO2: 23 mEq/L (ref 19–32)
Calcium: 7.7 mg/dL — ABNORMAL LOW (ref 8.4–10.5)
Chloride: 113 mEq/L — ABNORMAL HIGH (ref 96–112)
Creatinine, Ser: 0.51 mg/dL (ref 0.4–1.2)
GFR calc Af Amer: >60 mL/min (ref >60)
GFR calc non Af Amer: >60 mL/min (ref >60)
Glucose, Bld: 130 mg/dL — ABNORMAL HIGH (ref 70–99)
Phosphorus: 2.4 mg/dL (ref 2.3–4.6)
Potassium: 3.5 mEq/L (ref 3.5–5.1)
Sodium: 140 mEq/L (ref 135–145)

## 2010-07-20 LAB — TYPE AND SCREEN
ABO/RH(D): O POS
Antibody Screen: NEGATIVE

## 2010-07-20 LAB — ABO/RH: ABO/RH(D): O POS

## 2010-07-20 LAB — DIFFERENTIAL
Basophils Absolute: 0 10*3/uL (ref 0.0–0.1)
Basophils Absolute: 0 10*3/uL (ref 0.0–0.1)
Basophils Absolute: 0.1 10*3/uL (ref 0.0–0.1)
Basophils Relative: 0 % (ref 0–1)
Basophils Relative: 0 % (ref 0–1)
Basophils Relative: 0 % (ref 0–1)
Eosinophils Absolute: 0.4 10*3/uL (ref 0.0–0.7)
Eosinophils Absolute: 0 10*3/uL (ref 0.0–0.7)
Eosinophils Absolute: 0 10*3/uL (ref 0.0–0.7)
Eosinophils Relative: 3 % (ref 0–5)
Eosinophils Relative: 0 % (ref 0–5)
Eosinophils Relative: 0 % (ref 0–5)
Lymphocytes Relative: 8 % — ABNORMAL LOW (ref 12–46)
Lymphocytes Relative: 10 % — ABNORMAL LOW (ref 12–46)
Lymphocytes Relative: 15 % (ref 12–46)
Lymphs Abs: 1.4 10*3/uL (ref 0.7–4.0)
Lymphs Abs: 1.6 10*3/uL (ref 0.7–4.0)
Lymphs Abs: 1.9 10*3/uL (ref 0.7–4.0)
Monocytes Absolute: 1.3 10*3/uL — ABNORMAL HIGH (ref 0.1–1.0)
Monocytes Absolute: 0.8 10*3/uL (ref 0.1–1.0)
Monocytes Absolute: 0.8 10*3/uL (ref 0.1–1.0)
Monocytes Relative: 7 % (ref 3–12)
Monocytes Relative: 5 % (ref 3–12)
Monocytes Relative: 6 % (ref 3–12)
Neutro Abs: 14 10*3/uL — ABNORMAL HIGH (ref 1.7–7.7)
Neutro Abs: 10 10*3/uL — ABNORMAL HIGH (ref 1.7–7.7)
Neutro Abs: 13.6 10*3/uL — ABNORMAL HIGH (ref 1.7–7.7)
Neutrophils Relative %: 82 % — ABNORMAL HIGH (ref 43–77)
Neutrophils Relative %: 78 % — ABNORMAL HIGH (ref 43–77)
Neutrophils Relative %: 85 % — ABNORMAL HIGH (ref 43–77)

## 2010-07-20 LAB — POTASSIUM, BODY FLUID: Potassium, Fluid: 9.5 mEq/L

## 2010-07-20 LAB — HEMOGLOBIN AND HEMATOCRIT, BLOOD
HCT: 23.7 % — ABNORMAL LOW (ref 36.0–46.0)
HCT: 31 % — ABNORMAL LOW (ref 36.0–46.0)
HCT: 32.5 % — ABNORMAL LOW (ref 36.0–46.0)
HCT: 33.6 % — ABNORMAL LOW (ref 36.0–46.0)
HCT: 34.4 % — ABNORMAL LOW (ref 36.0–46.0)
HCT: 35.5 % — ABNORMAL LOW (ref 36.0–46.0)
HCT: 36.8 % (ref 36.0–46.0)
HCT: 29.1 % — ABNORMAL LOW (ref 36.0–46.0)
HCT: 29.7 % — ABNORMAL LOW (ref 36.0–46.0)
HCT: 29.7 % — ABNORMAL LOW (ref 36.0–46.0)
Hemoglobin: 10.1 g/dL — ABNORMAL LOW (ref 12.0–15.0)
Hemoglobin: 10.8 g/dL — ABNORMAL LOW (ref 12.0–15.0)
Hemoglobin: 11.1 g/dL — ABNORMAL LOW (ref 12.0–15.0)
Hemoglobin: 11.4 g/dL — ABNORMAL LOW (ref 12.0–15.0)
Hemoglobin: 11.7 g/dL — ABNORMAL LOW (ref 12.0–15.0)
Hemoglobin: 12.2 g/dL (ref 12.0–15.0)
Hemoglobin: 7.7 g/dL — ABNORMAL LOW (ref 12.0–15.0)
Hemoglobin: 9.4 g/dL — ABNORMAL LOW (ref 12.0–15.0)
Hemoglobin: 9.4 g/dL — ABNORMAL LOW (ref 12.0–15.0)
Hemoglobin: 9.6 g/dL — ABNORMAL LOW (ref 12.0–15.0)

## 2010-07-20 LAB — MAGNESIUM
Magnesium: 1.9 mg/dL (ref 1.5–2.5)
Magnesium: 2.1 mg/dL (ref 1.5–2.5)
Magnesium: 2.2 mg/dL (ref 1.5–2.5)

## 2010-07-20 LAB — URINALYSIS, ROUTINE W REFLEX MICROSCOPIC
Glucose, UA: NEGATIVE mg/dL
Hgb urine dipstick: NEGATIVE
Ketones, ur: NEGATIVE mg/dL
Nitrite: NEGATIVE
Protein, ur: NEGATIVE mg/dL
Specific Gravity, Urine: 1.03 (ref 1.005–1.030)
Urobilinogen, UA: 0.2 mg/dL (ref 0.0–1.0)
pH: 5.5 (ref 5.0–8.0)

## 2010-07-20 LAB — CHLORIDE, BODY FLUID: Chloride, Fluid: 102 mEq/L

## 2010-07-20 LAB — URINE MICROSCOPIC-ADD ON

## 2010-07-20 LAB — LIPASE, BLOOD
Lipase: 31 U/L (ref 11–59)
Lipase: 29 U/L (ref 11–59)

## 2010-07-20 LAB — PHOSPHORUS: Phosphorus: 3.8 mg/dL (ref 2.3–4.6)

## 2010-07-20 LAB — BRAIN NATRIURETIC PEPTIDE: Pro B Natriuretic peptide (BNP): <30 pg/mL (ref 0.0–100.0)

## 2010-07-20 LAB — SODIUM, BODY FLUID: Sodium Fluid: 110 mEq/L

## 2010-07-20 LAB — PREPARE RBC (CROSSMATCH)

## 2010-07-21 LAB — BASIC METABOLIC PANEL
BUN: 10 mg/dL (ref 6–23)
BUN: 13 mg/dL (ref 6–23)
BUN: 10 mg/dL (ref 6–23)
BUN: 32 mg/dL — ABNORMAL HIGH (ref 6–23)
BUN: 8 mg/dL (ref 6–23)
CO2: 25 mEq/L (ref 19–32)
CO2: 29 mEq/L (ref 19–32)
CO2: 24 mEq/L (ref 19–32)
CO2: 30 mEq/L (ref 19–32)
CO2: 38 mEq/L — ABNORMAL HIGH (ref 19–32)
Calcium: 8.1 mg/dL — ABNORMAL LOW (ref 8.4–10.5)
Calcium: 8.3 mg/dL — ABNORMAL LOW (ref 8.4–10.5)
Calcium: 7.8 mg/dL — ABNORMAL LOW (ref 8.4–10.5)
Calcium: 8 mg/dL — ABNORMAL LOW (ref 8.4–10.5)
Calcium: 8.8 mg/dL (ref 8.4–10.5)
Chloride: 103 mEq/L (ref 96–112)
Chloride: 107 mEq/L (ref 96–112)
Chloride: 102 mEq/L (ref 96–112)
Chloride: 110 mEq/L (ref 96–112)
Chloride: 88 mEq/L — ABNORMAL LOW (ref 96–112)
Creatinine, Ser: 0.46 mg/dL (ref 0.4–1.2)
Creatinine, Ser: 0.52 mg/dL (ref 0.4–1.2)
Creatinine, Ser: 0.4 mg/dL (ref 0.4–1.2)
Creatinine, Ser: 0.53 mg/dL (ref 0.4–1.2)
Creatinine, Ser: 0.71 mg/dL (ref 0.4–1.2)
GFR calc Af Amer: >60 mL/min (ref >60)
GFR calc Af Amer: >60 mL/min (ref >60)
GFR calc Af Amer: >60 mL/min (ref >60)
GFR calc Af Amer: >60 mL/min (ref >60)
GFR calc Af Amer: >60 mL/min (ref >60)
GFR calc non Af Amer: >60 mL/min (ref >60)
GFR calc non Af Amer: >60 mL/min (ref >60)
GFR calc non Af Amer: >60 mL/min (ref >60)
GFR calc non Af Amer: >60 mL/min (ref >60)
GFR calc non Af Amer: >60 mL/min (ref >60)
Glucose, Bld: 123 mg/dL — ABNORMAL HIGH (ref 70–99)
Glucose, Bld: 91 mg/dL (ref 70–99)
Glucose, Bld: 103 mg/dL — ABNORMAL HIGH (ref 70–99)
Glucose, Bld: 129 mg/dL — ABNORMAL HIGH (ref 70–99)
Glucose, Bld: 135 mg/dL — ABNORMAL HIGH (ref 70–99)
Potassium: 3.7 mEq/L (ref 3.5–5.1)
Potassium: 4.2 mEq/L (ref 3.5–5.1)
Potassium: 3.1 mEq/L — ABNORMAL LOW (ref 3.5–5.1)
Potassium: 3.5 mEq/L (ref 3.5–5.1)
Potassium: 4 mEq/L (ref 3.5–5.1)
Sodium: 136 mEq/L (ref 135–145)
Sodium: 137 mEq/L (ref 135–145)
Sodium: 133 mEq/L — ABNORMAL LOW (ref 135–145)
Sodium: 136 mEq/L (ref 135–145)
Sodium: 136 mEq/L (ref 135–145)

## 2010-07-21 LAB — COMPREHENSIVE METABOLIC PANEL
ALT: 25 U/L (ref 0–35)
AST: 35 U/L (ref 0–37)
Albumin: 3.9 g/dL (ref 3.5–5.2)
Alkaline Phosphatase: 61 U/L (ref 39–117)
BUN: 49 mg/dL — ABNORMAL HIGH (ref 6–23)
CO2: 36 mEq/L — ABNORMAL HIGH (ref 19–32)
Calcium: 9.2 mg/dL (ref 8.4–10.5)
Chloride: 80 mEq/L — ABNORMAL LOW (ref 96–112)
Creatinine, Ser: 0.87 mg/dL (ref 0.4–1.2)
GFR calc Af Amer: >60 mL/min (ref >60)
GFR calc non Af Amer: >60 mL/min (ref >60)
Glucose, Bld: 137 mg/dL — ABNORMAL HIGH (ref 70–99)
Potassium: <2 mEq/L — CL (ref 3.5–5.1)
Sodium: 134 mEq/L — ABNORMAL LOW (ref 135–145)
Total Bilirubin: 1.3 mg/dL — ABNORMAL HIGH (ref 0.3–1.2)
Total Protein: 7.2 g/dL (ref 6.0–8.3)

## 2010-07-21 LAB — URINALYSIS, ROUTINE W REFLEX MICROSCOPIC
Bilirubin Urine: NEGATIVE
Glucose, UA: NEGATIVE mg/dL
Hgb urine dipstick: NEGATIVE
Ketones, ur: NEGATIVE mg/dL
Nitrite: NEGATIVE
Protein, ur: NEGATIVE mg/dL
Specific Gravity, Urine: 1.019 (ref 1.005–1.030)
Urobilinogen, UA: 0.2 mg/dL (ref 0.0–1.0)
pH: 5 (ref 5.0–8.0)

## 2010-07-21 LAB — CBC
HCT: 29.2 % — ABNORMAL LOW (ref 36.0–46.0)
HCT: 28.5 % — ABNORMAL LOW (ref 36.0–46.0)
HCT: 30 % — ABNORMAL LOW (ref 36.0–46.0)
HCT: 33 % — ABNORMAL LOW (ref 36.0–46.0)
HCT: 40.7 % (ref 36.0–46.0)
Hemoglobin: 9.3 g/dL — ABNORMAL LOW (ref 12.0–15.0)
Hemoglobin: 10.4 g/dL — ABNORMAL LOW (ref 12.0–15.0)
Hemoglobin: 12.8 g/dL (ref 12.0–15.0)
Hemoglobin: 9.2 g/dL — ABNORMAL LOW (ref 12.0–15.0)
Hemoglobin: 9.4 g/dL — ABNORMAL LOW (ref 12.0–15.0)
MCHC: 31.7 g/dL (ref 30.0–36.0)
MCHC: 31.3 g/dL (ref 30.0–36.0)
MCHC: 31.4 g/dL (ref 30.0–36.0)
MCHC: 31.5 g/dL (ref 30.0–36.0)
MCHC: 32.2 g/dL (ref 30.0–36.0)
MCV: 78 fL (ref 78.0–100.0)
MCV: 77.4 fL — ABNORMAL LOW (ref 78.0–100.0)
MCV: 77.9 fL — ABNORMAL LOW (ref 78.0–100.0)
MCV: 78 fL (ref 78.0–100.0)
MCV: 78.1 fL (ref 78.0–100.0)
Platelets: 320 10*3/uL (ref 150–400)
Platelets: 274 10*3/uL (ref 150–400)
Platelets: 299 10*3/uL (ref 150–400)
Platelets: 335 10*3/uL (ref 150–400)
Platelets: 417 10*3/uL — ABNORMAL HIGH (ref 150–400)
RBC: 3.75 MIL/uL — ABNORMAL LOW (ref 3.87–5.11)
RBC: 3.66 MIL/uL — ABNORMAL LOW (ref 3.87–5.11)
RBC: 3.85 MIL/uL — ABNORMAL LOW (ref 3.87–5.11)
RBC: 4.27 MIL/uL (ref 3.87–5.11)
RBC: 5.22 MIL/uL — ABNORMAL HIGH (ref 3.87–5.11)
RDW: 15.1 % (ref 11.5–15.5)
RDW: 13.9 % (ref 11.5–15.5)
RDW: 14.4 % (ref 11.5–15.5)
RDW: 14.4 % (ref 11.5–15.5)
RDW: 14.8 % (ref 11.5–15.5)
WBC: 7.2 10*3/uL (ref 4.0–10.5)
WBC: 10.8 10*3/uL — ABNORMAL HIGH (ref 4.0–10.5)
WBC: 17.4 10*3/uL — ABNORMAL HIGH (ref 4.0–10.5)
WBC: 7.1 10*3/uL (ref 4.0–10.5)
WBC: 9.1 10*3/uL (ref 4.0–10.5)

## 2010-07-21 LAB — GLUCOSE, CAPILLARY
Glucose-Capillary: 101 mg/dL — ABNORMAL HIGH (ref 70–99)
Glucose-Capillary: 102 mg/dL — ABNORMAL HIGH (ref 70–99)
Glucose-Capillary: 104 mg/dL — ABNORMAL HIGH (ref 70–99)
Glucose-Capillary: 105 mg/dL — ABNORMAL HIGH (ref 70–99)
Glucose-Capillary: 108 mg/dL — ABNORMAL HIGH (ref 70–99)
Glucose-Capillary: 110 mg/dL — ABNORMAL HIGH (ref 70–99)
Glucose-Capillary: 110 mg/dL — ABNORMAL HIGH (ref 70–99)
Glucose-Capillary: 110 mg/dL — ABNORMAL HIGH (ref 70–99)
Glucose-Capillary: 111 mg/dL — ABNORMAL HIGH (ref 70–99)
Glucose-Capillary: 112 mg/dL — ABNORMAL HIGH (ref 70–99)
Glucose-Capillary: 112 mg/dL — ABNORMAL HIGH (ref 70–99)
Glucose-Capillary: 113 mg/dL — ABNORMAL HIGH (ref 70–99)
Glucose-Capillary: 115 mg/dL — ABNORMAL HIGH (ref 70–99)
Glucose-Capillary: 116 mg/dL — ABNORMAL HIGH (ref 70–99)
Glucose-Capillary: 116 mg/dL — ABNORMAL HIGH (ref 70–99)
Glucose-Capillary: 117 mg/dL — ABNORMAL HIGH (ref 70–99)
Glucose-Capillary: 117 mg/dL — ABNORMAL HIGH (ref 70–99)
Glucose-Capillary: 119 mg/dL — ABNORMAL HIGH (ref 70–99)
Glucose-Capillary: 126 mg/dL — ABNORMAL HIGH (ref 70–99)
Glucose-Capillary: 130 mg/dL — ABNORMAL HIGH (ref 70–99)
Glucose-Capillary: 132 mg/dL — ABNORMAL HIGH (ref 70–99)
Glucose-Capillary: 83 mg/dL (ref 70–99)
Glucose-Capillary: 87 mg/dL (ref 70–99)
Glucose-Capillary: 87 mg/dL (ref 70–99)
Glucose-Capillary: 90 mg/dL (ref 70–99)
Glucose-Capillary: 91 mg/dL (ref 70–99)
Glucose-Capillary: 91 mg/dL (ref 70–99)
Glucose-Capillary: 93 mg/dL (ref 70–99)
Glucose-Capillary: 94 mg/dL (ref 70–99)
Glucose-Capillary: 98 mg/dL (ref 70–99)
Glucose-Capillary: 98 mg/dL (ref 70–99)
Glucose-Capillary: 98 mg/dL (ref 70–99)
Glucose-Capillary: 98 mg/dL (ref 70–99)
Glucose-Capillary: 99 mg/dL (ref 70–99)
Glucose-Capillary: 101 mg/dL — ABNORMAL HIGH (ref 70–99)
Glucose-Capillary: 102 mg/dL — ABNORMAL HIGH (ref 70–99)
Glucose-Capillary: 105 mg/dL — ABNORMAL HIGH (ref 70–99)
Glucose-Capillary: 106 mg/dL — ABNORMAL HIGH (ref 70–99)
Glucose-Capillary: 109 mg/dL — ABNORMAL HIGH (ref 70–99)
Glucose-Capillary: 113 mg/dL — ABNORMAL HIGH (ref 70–99)
Glucose-Capillary: 115 mg/dL — ABNORMAL HIGH (ref 70–99)
Glucose-Capillary: 118 mg/dL — ABNORMAL HIGH (ref 70–99)
Glucose-Capillary: 118 mg/dL — ABNORMAL HIGH (ref 70–99)
Glucose-Capillary: 118 mg/dL — ABNORMAL HIGH (ref 70–99)
Glucose-Capillary: 121 mg/dL — ABNORMAL HIGH (ref 70–99)
Glucose-Capillary: 124 mg/dL — ABNORMAL HIGH (ref 70–99)
Glucose-Capillary: 131 mg/dL — ABNORMAL HIGH (ref 70–99)
Glucose-Capillary: 132 mg/dL — ABNORMAL HIGH (ref 70–99)
Glucose-Capillary: 144 mg/dL — ABNORMAL HIGH (ref 70–99)
Glucose-Capillary: 146 mg/dL — ABNORMAL HIGH (ref 70–99)
Glucose-Capillary: 148 mg/dL — ABNORMAL HIGH (ref 70–99)
Glucose-Capillary: 157 mg/dL — ABNORMAL HIGH (ref 70–99)

## 2010-07-21 LAB — URINALYSIS, MICROSCOPIC ONLY
Bilirubin Urine: NEGATIVE
Glucose, UA: NEGATIVE mg/dL
Hgb urine dipstick: NEGATIVE
Ketones, ur: NEGATIVE mg/dL
Nitrite: NEGATIVE
Protein, ur: NEGATIVE mg/dL
Specific Gravity, Urine: 1.023 (ref 1.005–1.030)
Urobilinogen, UA: 0.2 mg/dL (ref 0.0–1.0)
pH: 5.5 (ref 5.0–8.0)

## 2010-07-21 LAB — DIFFERENTIAL
Basophils Absolute: 0 10*3/uL (ref 0.0–0.1)
Basophils Absolute: 0 10*3/uL (ref 0.0–0.1)
Basophils Relative: 0 % (ref 0–1)
Basophils Relative: 0 % (ref 0–1)
Eosinophils Absolute: 0 10*3/uL (ref 0.0–0.7)
Eosinophils Absolute: 0.1 10*3/uL (ref 0.0–0.7)
Eosinophils Relative: 0 % (ref 0–5)
Eosinophils Relative: 1 % (ref 0–5)
Lymphocytes Relative: 12 % (ref 12–46)
Lymphocytes Relative: 30 % (ref 12–46)
Lymphs Abs: 2 10*3/uL (ref 0.7–4.0)
Lymphs Abs: 3.2 10*3/uL (ref 0.7–4.0)
Monocytes Absolute: 0.9 10*3/uL (ref 0.1–1.0)
Monocytes Absolute: 1 10*3/uL (ref 0.1–1.0)
Monocytes Relative: 6 % (ref 3–12)
Monocytes Relative: 8 % (ref 3–12)
Neutro Abs: 14.3 10*3/uL — ABNORMAL HIGH (ref 1.7–7.7)
Neutro Abs: 6.6 10*3/uL (ref 1.7–7.7)
Neutrophils Relative %: 61 % (ref 43–77)
Neutrophils Relative %: 82 % — ABNORMAL HIGH (ref 43–77)

## 2010-07-21 LAB — AMYLASE: Amylase: 49 U/L (ref 0–105)

## 2010-07-21 LAB — RENAL FUNCTION PANEL
Albumin: 2.6 g/dL — ABNORMAL LOW (ref 3.5–5.2)
BUN: 17 mg/dL (ref 6–23)
CO2: 34 mEq/L — ABNORMAL HIGH (ref 19–32)
Calcium: 8 mg/dL — ABNORMAL LOW (ref 8.4–10.5)
Chloride: 95 mEq/L — ABNORMAL LOW (ref 96–112)
Creatinine, Ser: 0.5 mg/dL (ref 0.4–1.2)
GFR calc Af Amer: >60 mL/min (ref >60)
GFR calc non Af Amer: >60 mL/min (ref >60)
Glucose, Bld: 105 mg/dL — ABNORMAL HIGH (ref 70–99)
Phosphorus: 3.3 mg/dL (ref 2.3–4.6)
Potassium: 3.3 mEq/L — ABNORMAL LOW (ref 3.5–5.1)
Sodium: 133 mEq/L — ABNORMAL LOW (ref 135–145)

## 2010-07-21 LAB — PHOSPHORUS: Phosphorus: 4.5 mg/dL (ref 2.3–4.6)

## 2010-07-21 LAB — URINE MICROSCOPIC-ADD ON

## 2010-07-21 LAB — POTASSIUM
Potassium: 3.8 mEq/L (ref 3.5–5.1)
Potassium: 2.6 mEq/L — CL (ref 3.5–5.1)

## 2010-07-21 LAB — LIPASE, BLOOD
Lipase: 50 U/L (ref 11–59)
Lipase: 29 U/L (ref 11–59)

## 2010-07-21 LAB — MAGNESIUM: Magnesium: 2.4 mg/dL (ref 1.5–2.5)

## 2010-07-21 LAB — PREALBUMIN: Prealbumin: 43.6 mg/dL (ref 18.0–45.0)

## 2010-07-22 NOTE — Miscellaneous (Signed)
Summary: Face to face encounter/[facility]  Face to face encounter/[facility]   Imported By: Sherian Rein 07/15/2010 13:56:58  _____________________________________________________________________  External Attachment:    Type:   Image     Comment:   External Document

## 2010-07-23 ENCOUNTER — Telehealth: Payer: Self-pay | Admitting: Internal Medicine

## 2010-07-23 LAB — COMPREHENSIVE METABOLIC PANEL
ALT: 20 U/L (ref 0–35)
ALT: 32 U/L (ref 0–35)
AST: 21 U/L (ref 0–37)
AST: 22 U/L (ref 0–37)
Albumin: 2.8 g/dL — ABNORMAL LOW (ref 3.5–5.2)
Albumin: 2.9 g/dL — ABNORMAL LOW (ref 3.5–5.2)
Alkaline Phosphatase: 59 U/L (ref 39–117)
Alkaline Phosphatase: 59 U/L (ref 39–117)
BUN: 23 mg/dL (ref 6–23)
BUN: 26 mg/dL — ABNORMAL HIGH (ref 6–23)
CO2: 36 mEq/L — ABNORMAL HIGH (ref 19–32)
CO2: 37 mEq/L — ABNORMAL HIGH (ref 19–32)
Calcium: 8.3 mg/dL — ABNORMAL LOW (ref 8.4–10.5)
Calcium: 8.4 mg/dL (ref 8.4–10.5)
Chloride: 89 mEq/L — ABNORMAL LOW (ref 96–112)
Chloride: 92 mEq/L — ABNORMAL LOW (ref 96–112)
Creatinine, Ser: 0.66 mg/dL (ref 0.4–1.2)
Creatinine, Ser: 0.73 mg/dL (ref 0.4–1.2)
GFR calc Af Amer: >60 mL/min (ref >60)
GFR calc Af Amer: >60 mL/min (ref >60)
GFR calc non Af Amer: >60 mL/min (ref >60)
GFR calc non Af Amer: >60 mL/min (ref >60)
Glucose, Bld: 138 mg/dL — ABNORMAL HIGH (ref 70–99)
Glucose, Bld: 168 mg/dL — ABNORMAL HIGH (ref 70–99)
Potassium: 2.9 mEq/L — ABNORMAL LOW (ref 3.5–5.1)
Potassium: 3.2 mEq/L — ABNORMAL LOW (ref 3.5–5.1)
Sodium: 135 mEq/L (ref 135–145)
Sodium: 136 mEq/L (ref 135–145)
Total Bilirubin: 0.4 mg/dL (ref 0.3–1.2)
Total Bilirubin: 0.4 mg/dL (ref 0.3–1.2)
Total Protein: 5.5 g/dL — ABNORMAL LOW (ref 6.0–8.3)
Total Protein: 5.6 g/dL — ABNORMAL LOW (ref 6.0–8.3)

## 2010-07-23 LAB — BASIC METABOLIC PANEL
BUN: 21 mg/dL (ref 6–23)
CO2: 35 mEq/L — ABNORMAL HIGH (ref 19–32)
Calcium: 8.1 mg/dL — ABNORMAL LOW (ref 8.4–10.5)
Chloride: 89 mEq/L — ABNORMAL LOW (ref 96–112)
Creatinine, Ser: 0.49 mg/dL (ref 0.4–1.2)
GFR calc Af Amer: >60 mL/min (ref >60)
GFR calc non Af Amer: >60 mL/min (ref >60)
Glucose, Bld: 112 mg/dL — ABNORMAL HIGH (ref 70–99)
Potassium: 3.2 mEq/L — ABNORMAL LOW (ref 3.5–5.1)
Sodium: 133 mEq/L — ABNORMAL LOW (ref 135–145)

## 2010-07-23 LAB — PREALBUMIN
Prealbumin: 31.1 mg/dL (ref 18.0–45.0)
Prealbumin: 33.4 mg/dL (ref 18.0–45.0)

## 2010-07-23 LAB — CBC
HCT: 30.6 % — ABNORMAL LOW (ref 36.0–46.0)
Hemoglobin: 9.6 g/dL — ABNORMAL LOW (ref 12.0–15.0)
MCHC: 31.4 g/dL (ref 30.0–36.0)
MCV: 79 fL (ref 78.0–100.0)
Platelets: 352 10*3/uL (ref 150–400)
RBC: 3.88 MIL/uL (ref 3.87–5.11)
RDW: 14.2 % (ref 11.5–15.5)
WBC: 10.5 10*3/uL (ref 4.0–10.5)

## 2010-07-23 LAB — T4, FREE: Free T4: 0.86 ng/dL (ref 0.80–1.80)

## 2010-07-23 LAB — PHOSPHORUS: Phosphorus: 3.6 mg/dL (ref 2.3–4.6)

## 2010-07-23 LAB — TSH: TSH: 1.687 u[IU]/mL (ref 0.350–4.500)

## 2010-07-23 LAB — MAGNESIUM: Magnesium: 2.5 mg/dL (ref 1.5–2.5)

## 2010-07-23 NOTE — Telephone Encounter (Signed)
Left message  For Tonya At [facility] that this should come from her PCP. If any problems with that call me back.

## 2010-07-24 ENCOUNTER — Telehealth: Payer: Self-pay | Admitting: Internal Medicine

## 2010-07-24 NOTE — Telephone Encounter (Signed)
Spoke with [facility] referral coordinator and explained to her that the patient has just had surgery at [facility] and she should call them first re: dysphagia. She will have patient to f/u with [facility]. Jesse Fall, RN Level II

## 2010-07-24 NOTE — Telephone Encounter (Signed)
I  Have received a n office note from Dr [name] from [facility] that she ought to follow up with me. Please add her to see an extender DB

## 2010-07-25 NOTE — Telephone Encounter (Signed)
Patient notified of appointment 07/30/10 at 8:30 AM with Willette Cluster, NP

## 2010-07-26 ENCOUNTER — Other Ambulatory Visit: Payer: Self-pay | Admitting: Internal Medicine

## 2010-07-27 ENCOUNTER — Emergency Department (HOSPITAL_COMMUNITY)
Admission: EM | Admit: 2010-07-27 | Discharge: 2010-07-27 | Disposition: A | Discharge status: Home / Self Care | Payer: Medicare Other | Attending: Emergency Medicine | Admitting: Emergency Medicine

## 2010-07-27 ENCOUNTER — Emergency Department (HOSPITAL_COMMUNITY): Payer: Medicare Other

## 2010-07-27 DIAGNOSIS — R109 Unspecified abdominal pain: Secondary | ICD-10-CM | POA: Insufficient documentation

## 2010-07-27 DIAGNOSIS — E119 Type 2 diabetes mellitus without complications: Secondary | ICD-10-CM | POA: Insufficient documentation

## 2010-07-27 DIAGNOSIS — F3289 Other specified depressive episodes: Secondary | ICD-10-CM | POA: Insufficient documentation

## 2010-07-27 DIAGNOSIS — R112 Nausea with vomiting, unspecified: Secondary | ICD-10-CM | POA: Insufficient documentation

## 2010-07-27 DIAGNOSIS — I10 Essential (primary) hypertension: Secondary | ICD-10-CM | POA: Insufficient documentation

## 2010-07-27 DIAGNOSIS — Z9889 Other specified postprocedural states: Secondary | ICD-10-CM | POA: Insufficient documentation

## 2010-07-27 DIAGNOSIS — N390 Urinary tract infection, site not specified: Secondary | ICD-10-CM | POA: Insufficient documentation

## 2010-07-27 DIAGNOSIS — F329 Major depressive disorder, single episode, unspecified: Secondary | ICD-10-CM | POA: Insufficient documentation

## 2010-07-27 DIAGNOSIS — G8929 Other chronic pain: Secondary | ICD-10-CM | POA: Insufficient documentation

## 2010-07-27 DIAGNOSIS — Z931 Gastrostomy status: Secondary | ICD-10-CM | POA: Insufficient documentation

## 2010-07-27 LAB — GLUCOSE, CAPILLARY
Glucose-Capillary: 100 mg/dL — ABNORMAL HIGH (ref 70–99)
Glucose-Capillary: 101 mg/dL — ABNORMAL HIGH (ref 70–99)
Glucose-Capillary: 101 mg/dL — ABNORMAL HIGH (ref 70–99)
Glucose-Capillary: 102 mg/dL — ABNORMAL HIGH (ref 70–99)
Glucose-Capillary: 103 mg/dL — ABNORMAL HIGH (ref 70–99)
Glucose-Capillary: 103 mg/dL — ABNORMAL HIGH (ref 70–99)
Glucose-Capillary: 104 mg/dL — ABNORMAL HIGH (ref 70–99)
Glucose-Capillary: 106 mg/dL — ABNORMAL HIGH (ref 70–99)
Glucose-Capillary: 108 mg/dL — ABNORMAL HIGH (ref 70–99)
Glucose-Capillary: 108 mg/dL — ABNORMAL HIGH (ref 70–99)
Glucose-Capillary: 108 mg/dL — ABNORMAL HIGH (ref 70–99)
Glucose-Capillary: 109 mg/dL — ABNORMAL HIGH (ref 70–99)
Glucose-Capillary: 112 mg/dL — ABNORMAL HIGH (ref 70–99)
Glucose-Capillary: 112 mg/dL — ABNORMAL HIGH (ref 70–99)
Glucose-Capillary: 113 mg/dL — ABNORMAL HIGH (ref 70–99)
Glucose-Capillary: 113 mg/dL — ABNORMAL HIGH (ref 70–99)
Glucose-Capillary: 113 mg/dL — ABNORMAL HIGH (ref 70–99)
Glucose-Capillary: 113 mg/dL — ABNORMAL HIGH (ref 70–99)
Glucose-Capillary: 114 mg/dL — ABNORMAL HIGH (ref 70–99)
Glucose-Capillary: 114 mg/dL — ABNORMAL HIGH (ref 70–99)
Glucose-Capillary: 115 mg/dL — ABNORMAL HIGH (ref 70–99)
Glucose-Capillary: 115 mg/dL — ABNORMAL HIGH (ref 70–99)
Glucose-Capillary: 116 mg/dL — ABNORMAL HIGH (ref 70–99)
Glucose-Capillary: 117 mg/dL — ABNORMAL HIGH (ref 70–99)
Glucose-Capillary: 117 mg/dL — ABNORMAL HIGH (ref 70–99)
Glucose-Capillary: 117 mg/dL — ABNORMAL HIGH (ref 70–99)
Glucose-Capillary: 118 mg/dL — ABNORMAL HIGH (ref 70–99)
Glucose-Capillary: 119 mg/dL — ABNORMAL HIGH (ref 70–99)
Glucose-Capillary: 120 mg/dL — ABNORMAL HIGH (ref 70–99)
Glucose-Capillary: 122 mg/dL — ABNORMAL HIGH (ref 70–99)
Glucose-Capillary: 122 mg/dL — ABNORMAL HIGH (ref 70–99)
Glucose-Capillary: 123 mg/dL — ABNORMAL HIGH (ref 70–99)
Glucose-Capillary: 123 mg/dL — ABNORMAL HIGH (ref 70–99)
Glucose-Capillary: 124 mg/dL — ABNORMAL HIGH (ref 70–99)
Glucose-Capillary: 125 mg/dL — ABNORMAL HIGH (ref 70–99)
Glucose-Capillary: 127 mg/dL — ABNORMAL HIGH (ref 70–99)
Glucose-Capillary: 127 mg/dL — ABNORMAL HIGH (ref 70–99)
Glucose-Capillary: 127 mg/dL — ABNORMAL HIGH (ref 70–99)
Glucose-Capillary: 128 mg/dL — ABNORMAL HIGH (ref 70–99)
Glucose-Capillary: 128 mg/dL — ABNORMAL HIGH (ref 70–99)
Glucose-Capillary: 128 mg/dL — ABNORMAL HIGH (ref 70–99)
Glucose-Capillary: 128 mg/dL — ABNORMAL HIGH (ref 70–99)
Glucose-Capillary: 129 mg/dL — ABNORMAL HIGH (ref 70–99)
Glucose-Capillary: 130 mg/dL — ABNORMAL HIGH (ref 70–99)
Glucose-Capillary: 132 mg/dL — ABNORMAL HIGH (ref 70–99)
Glucose-Capillary: 132 mg/dL — ABNORMAL HIGH (ref 70–99)
Glucose-Capillary: 132 mg/dL — ABNORMAL HIGH (ref 70–99)
Glucose-Capillary: 133 mg/dL — ABNORMAL HIGH (ref 70–99)
Glucose-Capillary: 133 mg/dL — ABNORMAL HIGH (ref 70–99)
Glucose-Capillary: 134 mg/dL — ABNORMAL HIGH (ref 70–99)
Glucose-Capillary: 134 mg/dL — ABNORMAL HIGH (ref 70–99)
Glucose-Capillary: 136 mg/dL — ABNORMAL HIGH (ref 70–99)
Glucose-Capillary: 137 mg/dL — ABNORMAL HIGH (ref 70–99)
Glucose-Capillary: 138 mg/dL — ABNORMAL HIGH (ref 70–99)
Glucose-Capillary: 139 mg/dL — ABNORMAL HIGH (ref 70–99)
Glucose-Capillary: 139 mg/dL — ABNORMAL HIGH (ref 70–99)
Glucose-Capillary: 142 mg/dL — ABNORMAL HIGH (ref 70–99)
Glucose-Capillary: 142 mg/dL — ABNORMAL HIGH (ref 70–99)
Glucose-Capillary: 143 mg/dL — ABNORMAL HIGH (ref 70–99)
Glucose-Capillary: 143 mg/dL — ABNORMAL HIGH (ref 70–99)
Glucose-Capillary: 144 mg/dL — ABNORMAL HIGH (ref 70–99)
Glucose-Capillary: 145 mg/dL — ABNORMAL HIGH (ref 70–99)
Glucose-Capillary: 145 mg/dL — ABNORMAL HIGH (ref 70–99)
Glucose-Capillary: 145 mg/dL — ABNORMAL HIGH (ref 70–99)
Glucose-Capillary: 151 mg/dL — ABNORMAL HIGH (ref 70–99)
Glucose-Capillary: 152 mg/dL — ABNORMAL HIGH (ref 70–99)
Glucose-Capillary: 152 mg/dL — ABNORMAL HIGH (ref 70–99)
Glucose-Capillary: 156 mg/dL — ABNORMAL HIGH (ref 70–99)
Glucose-Capillary: 156 mg/dL — ABNORMAL HIGH (ref 70–99)
Glucose-Capillary: 159 mg/dL — ABNORMAL HIGH (ref 70–99)
Glucose-Capillary: 161 mg/dL — ABNORMAL HIGH (ref 70–99)
Glucose-Capillary: 72 mg/dL (ref 70–99)
Glucose-Capillary: 73 mg/dL (ref 70–99)
Glucose-Capillary: 76 mg/dL (ref 70–99)
Glucose-Capillary: 79 mg/dL (ref 70–99)
Glucose-Capillary: 79 mg/dL (ref 70–99)
Glucose-Capillary: 80 mg/dL (ref 70–99)
Glucose-Capillary: 81 mg/dL (ref 70–99)
Glucose-Capillary: 83 mg/dL (ref 70–99)
Glucose-Capillary: 87 mg/dL (ref 70–99)
Glucose-Capillary: 87 mg/dL (ref 70–99)
Glucose-Capillary: 87 mg/dL (ref 70–99)
Glucose-Capillary: 87 mg/dL (ref 70–99)
Glucose-Capillary: 88 mg/dL (ref 70–99)
Glucose-Capillary: 88 mg/dL (ref 70–99)
Glucose-Capillary: 88 mg/dL (ref 70–99)
Glucose-Capillary: 88 mg/dL (ref 70–99)
Glucose-Capillary: 89 mg/dL (ref 70–99)
Glucose-Capillary: 89 mg/dL (ref 70–99)
Glucose-Capillary: 89 mg/dL (ref 70–99)
Glucose-Capillary: 90 mg/dL (ref 70–99)
Glucose-Capillary: 91 mg/dL (ref 70–99)
Glucose-Capillary: 91 mg/dL (ref 70–99)
Glucose-Capillary: 92 mg/dL (ref 70–99)
Glucose-Capillary: 93 mg/dL (ref 70–99)
Glucose-Capillary: 94 mg/dL (ref 70–99)
Glucose-Capillary: 96 mg/dL (ref 70–99)
Glucose-Capillary: 97 mg/dL (ref 70–99)
Glucose-Capillary: 97 mg/dL (ref 70–99)

## 2010-07-27 LAB — IRON AND TIBC
Iron: 62 ug/dL (ref 42–135)
Saturation Ratios: 21 % (ref 20–55)
TIBC: 294 ug/dL (ref 250–470)
UIBC: 232 ug/dL

## 2010-07-27 LAB — COMPREHENSIVE METABOLIC PANEL
ALT: 10 U/L (ref 0–35)
ALT: 14 U/L (ref 0–35)
ALT: 23 U/L (ref 0–35)
ALT: 34 U/L (ref 0–35)
AST: 16 U/L (ref 0–37)
AST: 19 U/L (ref 0–37)
AST: 20 U/L (ref 0–37)
AST: 23 U/L (ref 0–37)
Albumin: 2.8 g/dL — ABNORMAL LOW (ref 3.5–5.2)
Albumin: 2.8 g/dL — ABNORMAL LOW (ref 3.5–5.2)
Albumin: 2.8 g/dL — ABNORMAL LOW (ref 3.5–5.2)
Albumin: 3.4 g/dL — ABNORMAL LOW (ref 3.5–5.2)
Alkaline Phosphatase: 74 U/L (ref 39–117)
Alkaline Phosphatase: 70 U/L (ref 39–117)
Alkaline Phosphatase: 65 U/L (ref 39–117)
Alkaline Phosphatase: 83 U/L (ref 39–117)
BUN: 9 mg/dL (ref 6–23)
BUN: 15 mg/dL (ref 6–23)
BUN: 11 mg/dL (ref 6–23)
BUN: 16 mg/dL (ref 6–23)
CO2: 31 mEq/L (ref 19–32)
CO2: 31 mEq/L (ref 19–32)
CO2: 24 mEq/L (ref 19–32)
CO2: 28 mEq/L (ref 19–32)
Calcium: 8.9 mg/dL (ref 8.4–10.5)
Calcium: 8.7 mg/dL (ref 8.4–10.5)
Calcium: 7.6 mg/dL — ABNORMAL LOW (ref 8.4–10.5)
Calcium: 8.9 mg/dL (ref 8.4–10.5)
Chloride: 106 mEq/L (ref 96–112)
Chloride: 98 mEq/L (ref 96–112)
Chloride: 102 mEq/L (ref 96–112)
Chloride: 108 mEq/L (ref 96–112)
Creatinine, Ser: 0.46 mg/dL (ref 0.4–1.2)
Creatinine, Ser: 0.58 mg/dL (ref 0.4–1.2)
Creatinine, Ser: 0.45 mg/dL (ref 0.4–1.2)
Creatinine, Ser: 0.48 mg/dL (ref 0.4–1.2)
GFR calc Af Amer: >60 mL/min (ref >60)
GFR calc Af Amer: >60 mL/min (ref >60)
GFR calc Af Amer: >60 mL/min (ref >60)
GFR calc Af Amer: >60 mL/min (ref >60)
GFR calc non Af Amer: >60 mL/min (ref >60)
GFR calc non Af Amer: >60 mL/min (ref >60)
GFR calc non Af Amer: >60 mL/min (ref >60)
GFR calc non Af Amer: >60 mL/min (ref >60)
Glucose, Bld: 96 mg/dL (ref 70–99)
Glucose, Bld: 117 mg/dL — ABNORMAL HIGH (ref 70–99)
Glucose, Bld: 87 mg/dL (ref 70–99)
Glucose, Bld: 92 mg/dL (ref 70–99)
Potassium: 3.7 mEq/L (ref 3.5–5.1)
Potassium: 3 mEq/L — ABNORMAL LOW (ref 3.5–5.1)
Potassium: 4.3 mEq/L (ref 3.5–5.1)
Potassium: 4.6 mEq/L (ref 3.5–5.1)
Sodium: 143 mEq/L (ref 135–145)
Sodium: 136 mEq/L (ref 135–145)
Sodium: 137 mEq/L (ref 135–145)
Sodium: 138 mEq/L (ref 135–145)
Total Bilirubin: <0.1 mg/dL — ABNORMAL LOW (ref 0.3–1.2)
Total Bilirubin: 0.5 mg/dL (ref 0.3–1.2)
Total Bilirubin: 0.3 mg/dL (ref 0.3–1.2)
Total Bilirubin: 0.4 mg/dL (ref 0.3–1.2)
Total Protein: 7.2 g/dL (ref 6.0–8.3)
Total Protein: 5.9 g/dL — ABNORMAL LOW (ref 6.0–8.3)
Total Protein: 5.2 g/dL — ABNORMAL LOW (ref 6.0–8.3)
Total Protein: 6.3 g/dL (ref 6.0–8.3)

## 2010-07-27 LAB — CBC
HCT: 35 % — ABNORMAL LOW (ref 36.0–46.0)
HCT: 29.5 % — ABNORMAL LOW (ref 36.0–46.0)
HCT: 33 % — ABNORMAL LOW (ref 36.0–46.0)
HCT: 27.8 % — ABNORMAL LOW (ref 36.0–46.0)
HCT: 30.2 % — ABNORMAL LOW (ref 36.0–46.0)
HCT: 33.3 % — ABNORMAL LOW (ref 36.0–46.0)
Hemoglobin: 11.1 g/dL — ABNORMAL LOW (ref 12.0–15.0)
Hemoglobin: 10.6 g/dL — ABNORMAL LOW (ref 12.0–15.0)
Hemoglobin: 9.4 g/dL — ABNORMAL LOW (ref 12.0–15.0)
Hemoglobin: 10.2 g/dL — ABNORMAL LOW (ref 12.0–15.0)
Hemoglobin: 8.8 g/dL — ABNORMAL LOW (ref 12.0–15.0)
Hemoglobin: 9.3 g/dL — ABNORMAL LOW (ref 12.0–15.0)
MCH: 25.6 pg — ABNORMAL LOW (ref 26.0–34.0)
MCHC: 31.7 g/dL (ref 30.0–36.0)
MCHC: 31.8 g/dL (ref 30.0–36.0)
MCHC: 32 g/dL (ref 30.0–36.0)
MCHC: 30.6 g/dL (ref 30.0–36.0)
MCHC: 30.7 g/dL (ref 30.0–36.0)
MCHC: 31.6 g/dL (ref 30.0–36.0)
MCV: 80.6 fL (ref 78.0–100.0)
MCV: 79.5 fL (ref 78.0–100.0)
MCV: 79.9 fL (ref 78.0–100.0)
MCV: 79.7 fL (ref 78.0–100.0)
MCV: 80.4 fL (ref 78.0–100.0)
MCV: 80.9 fL (ref 78.0–100.0)
Platelets: 531 10*3/uL — ABNORMAL HIGH (ref 150–400)
Platelets: 382 10*3/uL (ref 150–400)
Platelets: 447 10*3/uL — ABNORMAL HIGH (ref 150–400)
Platelets: 270 10*3/uL (ref 150–400)
Platelets: 311 10*3/uL (ref 150–400)
Platelets: 327 10*3/uL (ref 150–400)
RBC: 4.34 MIL/uL (ref 3.87–5.11)
RBC: 3.69 MIL/uL — ABNORMAL LOW (ref 3.87–5.11)
RBC: 4.15 MIL/uL (ref 3.87–5.11)
RBC: 3.46 MIL/uL — ABNORMAL LOW (ref 3.87–5.11)
RBC: 3.73 MIL/uL — ABNORMAL LOW (ref 3.87–5.11)
RBC: 4.18 MIL/uL (ref 3.87–5.11)
RDW: 15 % (ref 11.5–15.5)
RDW: 14.6 % (ref 11.5–15.5)
RDW: 15.2 % (ref 11.5–15.5)
RDW: 15.6 % — ABNORMAL HIGH (ref 11.5–15.5)
RDW: 16.6 % — ABNORMAL HIGH (ref 11.5–15.5)
RDW: 17.1 % — ABNORMAL HIGH (ref 11.5–15.5)
WBC: 13 10*3/uL — ABNORMAL HIGH (ref 4.0–10.5)
WBC: 8.9 10*3/uL (ref 4.0–10.5)
WBC: 9.9 10*3/uL (ref 4.0–10.5)
WBC: 6.5 10*3/uL (ref 4.0–10.5)
WBC: 7.5 10*3/uL (ref 4.0–10.5)
WBC: 8.7 10*3/uL (ref 4.0–10.5)

## 2010-07-27 LAB — BASIC METABOLIC PANEL
BUN: 13 mg/dL (ref 6–23)
BUN: 17 mg/dL (ref 6–23)
BUN: 20 mg/dL (ref 6–23)
CO2: 30 mEq/L (ref 19–32)
CO2: 32 mEq/L (ref 19–32)
CO2: 33 mEq/L — ABNORMAL HIGH (ref 19–32)
Calcium: 8.2 mg/dL — ABNORMAL LOW (ref 8.4–10.5)
Calcium: 7.7 mg/dL — ABNORMAL LOW (ref 8.4–10.5)
Calcium: 8.4 mg/dL (ref 8.4–10.5)
Chloride: 104 mEq/L (ref 96–112)
Chloride: 100 mEq/L (ref 96–112)
Chloride: 98 mEq/L (ref 96–112)
Creatinine, Ser: 0.56 mg/dL (ref 0.4–1.2)
Creatinine, Ser: 0.53 mg/dL (ref 0.4–1.2)
Creatinine, Ser: 0.55 mg/dL (ref 0.4–1.2)
GFR calc Af Amer: >60 mL/min (ref >60)
GFR calc Af Amer: >60 mL/min (ref >60)
GFR calc Af Amer: >60 mL/min (ref >60)
GFR calc non Af Amer: >60 mL/min (ref >60)
GFR calc non Af Amer: >60 mL/min (ref >60)
GFR calc non Af Amer: >60 mL/min (ref >60)
Glucose, Bld: 128 mg/dL — ABNORMAL HIGH (ref 70–99)
Glucose, Bld: 122 mg/dL — ABNORMAL HIGH (ref 70–99)
Glucose, Bld: 80 mg/dL (ref 70–99)
Potassium: 4 mEq/L (ref 3.5–5.1)
Potassium: 3.4 mEq/L — ABNORMAL LOW (ref 3.5–5.1)
Potassium: 4.3 mEq/L (ref 3.5–5.1)
Sodium: 140 mEq/L (ref 135–145)
Sodium: 137 mEq/L (ref 135–145)
Sodium: 137 mEq/L (ref 135–145)

## 2010-07-27 LAB — DIFFERENTIAL
Basophils Absolute: 0 10*3/uL (ref 0.0–0.1)
Basophils Absolute: 0 10*3/uL (ref 0.0–0.1)
Basophils Relative: 0 % (ref 0–1)
Basophils Relative: 1 % (ref 0–1)
Eosinophils Absolute: 0.2 10*3/uL (ref 0.0–0.7)
Eosinophils Absolute: 0.3 10*3/uL (ref 0.0–0.7)
Eosinophils Relative: 2 % (ref 0–5)
Eosinophils Relative: 3 % (ref 0–5)
Lymphocytes Relative: 14 % (ref 12–46)
Lymphocytes Relative: 29 % (ref 12–46)
Lymphs Abs: 1.8 10*3/uL (ref 0.7–4.0)
Lymphs Abs: 2.8 10*3/uL (ref 0.7–4.0)
Monocytes Absolute: 0.7 10*3/uL (ref 0.1–1.0)
Monocytes Absolute: 0.7 10*3/uL (ref 0.1–1.0)
Monocytes Relative: 6 % (ref 3–12)
Monocytes Relative: 7 % (ref 3–12)
Neutro Abs: 10.2 10*3/uL — ABNORMAL HIGH (ref 1.7–7.7)
Neutro Abs: 6 10*3/uL (ref 1.7–7.7)
Neutrophils Relative %: 79 % — ABNORMAL HIGH (ref 43–77)
Neutrophils Relative %: 61 % (ref 43–77)

## 2010-07-27 LAB — URINE MICROSCOPIC-ADD ON

## 2010-07-27 LAB — VITAMIN B12: Vitamin B-12: 770 pg/mL (ref 211–911)

## 2010-07-27 LAB — LIPASE, BLOOD: Lipase: 48 U/L (ref 11–59)

## 2010-07-27 LAB — URINALYSIS, ROUTINE W REFLEX MICROSCOPIC
Bilirubin Urine: NEGATIVE
Glucose, UA: NEGATIVE mg/dL
Hgb urine dipstick: NEGATIVE
Ketones, ur: NEGATIVE mg/dL
Nitrite: NEGATIVE
Protein, ur: NEGATIVE mg/dL
Specific Gravity, Urine: 1.021 (ref 1.005–1.030)
Urobilinogen, UA: 1 mg/dL (ref 0.0–1.0)
pH: 7.5 (ref 5.0–8.0)

## 2010-07-27 LAB — MAGNESIUM
Magnesium: 2 mg/dL (ref 1.5–2.5)
Magnesium: 2.3 mg/dL (ref 1.5–2.5)

## 2010-07-27 LAB — PREALBUMIN
Prealbumin: 25.8 mg/dL (ref 18.0–45.0)
Prealbumin: 26.1 mg/dL (ref 18.0–45.0)
Prealbumin: 33.7 mg/dL (ref 18.0–45.0)

## 2010-07-27 LAB — APTT: aPTT: 32 seconds (ref 24–37)

## 2010-07-27 LAB — PROTIME-INR
INR: 1.03 (ref 0.00–1.49)
Prothrombin Time: 13.4 seconds (ref 11.6–15.2)

## 2010-07-30 ENCOUNTER — Encounter: Payer: Self-pay | Admitting: Nurse Practitioner

## 2010-07-30 ENCOUNTER — Ambulatory Visit (INDEPENDENT_AMBULATORY_CARE_PROVIDER_SITE_OTHER): Payer: Medicare Other | Admitting: Nurse Practitioner

## 2010-07-30 DIAGNOSIS — R1310 Dysphagia, unspecified: Secondary | ICD-10-CM | POA: Insufficient documentation

## 2010-07-30 MED ORDER — SUCRALFATE 1 GM/10ML PO SUSP: 1.0000 g | Freq: Four times a day (QID) | ORAL | Status: DC

## 2010-07-30 MED ORDER — DICLOFENAC SODIUM 1 % TD GEL: TRANSDERMAL | Status: DC

## 2010-07-30 MED ORDER — LANSOPRAZOLE 30 MG PO TBDP: 30.0000 mg | ORAL_TABLET | Freq: Every day | ORAL | Status: DC

## 2010-07-30 MED ORDER — FLUCONAZOLE 10 MG/ML PO SUSR: ORAL | Status: DC

## 2010-07-30 NOTE — Patient Instructions (Signed)
We have scheduled the Barium Swallow for Friday the 30th. Directions provided. We sent prescriptions for Carafate, Diflucan liquid, and Voltaren 1 % Gel. To [facility].

## 2010-07-30 NOTE — Assessment & Plan Note (Signed)
Dysphagia and odynophagia.rule out severe esophagitis.  No oral plaque on exam but could still have candida esophagitis.

## 2010-07-30 NOTE — Progress Notes (Signed)
  Emily [name] [birth year]  HPI: Emily [name] is a 70 year old female with an extensive GI history. She had severe GERD, gastroparesis and esophagitis with stricturing. She had a Nissen fundoplication and Belsey procedure via a left thoracotomy. In 2010 the patient had a gastrojejunostomy and placement of a feeding jejunostomy. Emily [name] continued to have frequent hospital admissions for nausea ,vomiting, and dehydration . We referred her  to Dr. [name] at [facility] and she underwent a total gastrectomy with formation of intestinal pouch and Roux-en-y reconstruction. Patient comes in today and states she isn't any better. Continues to have nausea and ? vomiting (seems to be more of spitting issue). Her main reason for today's visit is dysphagia and odynophagia. Even water is uncomfortable to swallow and comes back up. She is using her J- tube for daily nutrition. She complains of LUQ pain which has been a chronic problem. She requests refill on the "salve" we gave her for the LUQ pain  Current Medications, Allergies, Past Medical History, Past Surgical History, Family History and Social History were reviewed in [employer] record.   Physical Exam: General: Depressed appearing black female in no acute distress Head: Normocephalic and atraumatic Eyes:  sclerae anicteric,conjunctive pink. Ears: Normal auditory acuity Mouth: No deformity or lesions Neck: Supple, no masses.  Lungs: Clear throughout to auscultation Heart: Regular rate and rhythm; no murmurs heard Abdomen: Soft, non tender and non distended. No masses or hepatomegaly noted. Normal Bowel sounds. J tube site without redness, swelling or drainage. Rectal: Not done Musculoskeletal: Symmetrical with no gross deformities  Skin: No lesions on visible extremities Extremities: No edema or deformities noted Neurological: Alert oriented x 4, grossly nonfocal Cervical Nodes:  No significant cervical adenopathy Psychological:   Cooperative. Flat affect

## 2010-08-01 ENCOUNTER — Telehealth: Payer: Self-pay | Admitting: Internal Medicine

## 2010-08-01 ENCOUNTER — Telehealth: Payer: Self-pay | Admitting: *Deleted

## 2010-08-01 ENCOUNTER — Ambulatory Visit (HOSPITAL_COMMUNITY)
Admission: RE | Admit: 2010-08-01 | Discharge: 2010-08-01 | Disposition: A | Discharge status: Home / Self Care | Payer: Medicare Other | Source: Ambulatory Visit | Attending: Internal Medicine | Admitting: Internal Medicine

## 2010-08-01 DIAGNOSIS — K219 Gastro-esophageal reflux disease without esophagitis: Secondary | ICD-10-CM | POA: Insufficient documentation

## 2010-08-01 DIAGNOSIS — R1310 Dysphagia, unspecified: Secondary | ICD-10-CM | POA: Insufficient documentation

## 2010-08-01 NOTE — Assessment & Plan Note (Addendum)
Acute on chronic dysphagia with known history of severe esophagitis, esophageal dysmotility / strictures. Dysphagia worse since discharge from [facility] following her total gastrectomy. No obvious oral plaque but candida esophagitis is possible. Patient has not been taking her Carafate. Although she is not taking her proton inhibitor on a daily basis, theoretically she probably doesn't need it now. Will treat empirically with Diflucan and restart Carafate (suspension). Will obtain a barium swallow for evaluation, further recommendations based on those findings.

## 2010-08-01 NOTE — Progress Notes (Signed)
Reviewed and agree.

## 2010-08-01 NOTE — Telephone Encounter (Signed)
Received a call requesting an MD signature on orders. Darcey Nora, RN spoke with radiology and is working on this.

## 2010-08-01 NOTE — Telephone Encounter (Signed)
Results of Barium study discussed with the patient at length: she had a sufficient flow of the Barium through the esophagus, 13 mm tablet passed without difficulty. We have discussed full liquid diet at length, I gave her some recipes to try. We will see her ever 2 months and follow her progress.

## 2010-08-04 LAB — BASIC METABOLIC PANEL
BUN: 12 mg/dL (ref 6–23)
BUN: 16 mg/dL (ref 6–23)
BUN: 2 mg/dL — ABNORMAL LOW (ref 6–23)
BUN: 3 mg/dL — ABNORMAL LOW (ref 6–23)
BUN: 3 mg/dL — ABNORMAL LOW (ref 6–23)
BUN: 4 mg/dL — ABNORMAL LOW (ref 6–23)
BUN: 4 mg/dL — ABNORMAL LOW (ref 6–23)
BUN: 4 mg/dL — ABNORMAL LOW (ref 6–23)
BUN: 6 mg/dL (ref 6–23)
BUN: <1 mg/dL — ABNORMAL LOW (ref 6–23)
CO2: 26 mEq/L (ref 19–32)
CO2: 26 mEq/L (ref 19–32)
CO2: 27 mEq/L (ref 19–32)
CO2: 27 mEq/L (ref 19–32)
CO2: 27 mEq/L (ref 19–32)
CO2: 28 mEq/L (ref 19–32)
CO2: 29 mEq/L (ref 19–32)
CO2: 30 mEq/L (ref 19–32)
CO2: 30 mEq/L (ref 19–32)
CO2: 31 mEq/L (ref 19–32)
Calcium: 8.6 mg/dL (ref 8.4–10.5)
Calcium: 8.6 mg/dL (ref 8.4–10.5)
Calcium: 8.7 mg/dL (ref 8.4–10.5)
Calcium: 8.9 mg/dL (ref 8.4–10.5)
Calcium: 8.9 mg/dL (ref 8.4–10.5)
Calcium: 8.9 mg/dL (ref 8.4–10.5)
Calcium: 9 mg/dL (ref 8.4–10.5)
Calcium: 9 mg/dL (ref 8.4–10.5)
Calcium: 9.1 mg/dL (ref 8.4–10.5)
Calcium: 9.4 mg/dL (ref 8.4–10.5)
Chloride: 101 mEq/L (ref 96–112)
Chloride: 103 mEq/L (ref 96–112)
Chloride: 104 mEq/L (ref 96–112)
Chloride: 104 mEq/L (ref 96–112)
Chloride: 104 mEq/L (ref 96–112)
Chloride: 104 mEq/L (ref 96–112)
Chloride: 105 mEq/L (ref 96–112)
Chloride: 107 mEq/L (ref 96–112)
Chloride: 107 mEq/L (ref 96–112)
Chloride: 111 mEq/L (ref 96–112)
Creatinine, Ser: 0.48 mg/dL (ref 0.4–1.2)
Creatinine, Ser: 0.49 mg/dL (ref 0.4–1.2)
Creatinine, Ser: 0.51 mg/dL (ref 0.4–1.2)
Creatinine, Ser: 0.56 mg/dL (ref 0.4–1.2)
Creatinine, Ser: 0.57 mg/dL (ref 0.4–1.2)
Creatinine, Ser: 0.57 mg/dL (ref 0.4–1.2)
Creatinine, Ser: 0.57 mg/dL (ref 0.4–1.2)
Creatinine, Ser: 0.57 mg/dL (ref 0.4–1.2)
Creatinine, Ser: 0.6 mg/dL (ref 0.4–1.2)
Creatinine, Ser: 0.66 mg/dL (ref 0.4–1.2)
GFR calc Af Amer: >60 mL/min (ref >60)
GFR calc Af Amer: >60 mL/min (ref >60)
GFR calc Af Amer: >60 mL/min (ref >60)
GFR calc Af Amer: >60 mL/min (ref >60)
GFR calc Af Amer: >60 mL/min (ref >60)
GFR calc Af Amer: >60 mL/min (ref >60)
GFR calc Af Amer: >60 mL/min (ref >60)
GFR calc Af Amer: >60 mL/min (ref >60)
GFR calc Af Amer: >60 mL/min (ref >60)
GFR calc Af Amer: >60 mL/min (ref >60)
GFR calc non Af Amer: >60 mL/min (ref >60)
GFR calc non Af Amer: >60 mL/min (ref >60)
GFR calc non Af Amer: >60 mL/min (ref >60)
GFR calc non Af Amer: >60 mL/min (ref >60)
GFR calc non Af Amer: >60 mL/min (ref >60)
GFR calc non Af Amer: >60 mL/min (ref >60)
GFR calc non Af Amer: >60 mL/min (ref >60)
GFR calc non Af Amer: >60 mL/min (ref >60)
GFR calc non Af Amer: >60 mL/min (ref >60)
GFR calc non Af Amer: >60 mL/min (ref >60)
Glucose, Bld: 117 mg/dL — ABNORMAL HIGH (ref 70–99)
Glucose, Bld: 121 mg/dL — ABNORMAL HIGH (ref 70–99)
Glucose, Bld: 122 mg/dL — ABNORMAL HIGH (ref 70–99)
Glucose, Bld: 125 mg/dL — ABNORMAL HIGH (ref 70–99)
Glucose, Bld: 129 mg/dL — ABNORMAL HIGH (ref 70–99)
Glucose, Bld: 138 mg/dL — ABNORMAL HIGH (ref 70–99)
Glucose, Bld: 141 mg/dL — ABNORMAL HIGH (ref 70–99)
Glucose, Bld: 151 mg/dL — ABNORMAL HIGH (ref 70–99)
Glucose, Bld: 160 mg/dL — ABNORMAL HIGH (ref 70–99)
Glucose, Bld: 89 mg/dL (ref 70–99)
Potassium: 3.3 mEq/L — ABNORMAL LOW (ref 3.5–5.1)
Potassium: 3.5 mEq/L (ref 3.5–5.1)
Potassium: 3.6 mEq/L (ref 3.5–5.1)
Potassium: 3.7 mEq/L (ref 3.5–5.1)
Potassium: 3.9 mEq/L (ref 3.5–5.1)
Potassium: 3.9 mEq/L (ref 3.5–5.1)
Potassium: 4.1 mEq/L (ref 3.5–5.1)
Potassium: 4.2 mEq/L (ref 3.5–5.1)
Potassium: 4.3 mEq/L (ref 3.5–5.1)
Potassium: 4.4 mEq/L (ref 3.5–5.1)
Sodium: 138 mEq/L (ref 135–145)
Sodium: 139 mEq/L (ref 135–145)
Sodium: 139 mEq/L (ref 135–145)
Sodium: 139 mEq/L (ref 135–145)
Sodium: 140 mEq/L (ref 135–145)
Sodium: 141 mEq/L (ref 135–145)
Sodium: 141 mEq/L (ref 135–145)
Sodium: 142 mEq/L (ref 135–145)
Sodium: 142 mEq/L (ref 135–145)
Sodium: 144 mEq/L (ref 135–145)

## 2010-08-04 LAB — GLUCOSE, CAPILLARY
Glucose-Capillary: 100 mg/dL — ABNORMAL HIGH (ref 70–99)
Glucose-Capillary: 104 mg/dL — ABNORMAL HIGH (ref 70–99)
Glucose-Capillary: 104 mg/dL — ABNORMAL HIGH (ref 70–99)
Glucose-Capillary: 106 mg/dL — ABNORMAL HIGH (ref 70–99)
Glucose-Capillary: 106 mg/dL — ABNORMAL HIGH (ref 70–99)
Glucose-Capillary: 107 mg/dL — ABNORMAL HIGH (ref 70–99)
Glucose-Capillary: 110 mg/dL — ABNORMAL HIGH (ref 70–99)
Glucose-Capillary: 110 mg/dL — ABNORMAL HIGH (ref 70–99)
Glucose-Capillary: 111 mg/dL — ABNORMAL HIGH (ref 70–99)
Glucose-Capillary: 111 mg/dL — ABNORMAL HIGH (ref 70–99)
Glucose-Capillary: 112 mg/dL — ABNORMAL HIGH (ref 70–99)
Glucose-Capillary: 112 mg/dL — ABNORMAL HIGH (ref 70–99)
Glucose-Capillary: 112 mg/dL — ABNORMAL HIGH (ref 70–99)
Glucose-Capillary: 113 mg/dL — ABNORMAL HIGH (ref 70–99)
Glucose-Capillary: 114 mg/dL — ABNORMAL HIGH (ref 70–99)
Glucose-Capillary: 114 mg/dL — ABNORMAL HIGH (ref 70–99)
Glucose-Capillary: 114 mg/dL — ABNORMAL HIGH (ref 70–99)
Glucose-Capillary: 114 mg/dL — ABNORMAL HIGH (ref 70–99)
Glucose-Capillary: 115 mg/dL — ABNORMAL HIGH (ref 70–99)
Glucose-Capillary: 115 mg/dL — ABNORMAL HIGH (ref 70–99)
Glucose-Capillary: 116 mg/dL — ABNORMAL HIGH (ref 70–99)
Glucose-Capillary: 117 mg/dL — ABNORMAL HIGH (ref 70–99)
Glucose-Capillary: 118 mg/dL — ABNORMAL HIGH (ref 70–99)
Glucose-Capillary: 119 mg/dL — ABNORMAL HIGH (ref 70–99)
Glucose-Capillary: 119 mg/dL — ABNORMAL HIGH (ref 70–99)
Glucose-Capillary: 119 mg/dL — ABNORMAL HIGH (ref 70–99)
Glucose-Capillary: 119 mg/dL — ABNORMAL HIGH (ref 70–99)
Glucose-Capillary: 119 mg/dL — ABNORMAL HIGH (ref 70–99)
Glucose-Capillary: 119 mg/dL — ABNORMAL HIGH (ref 70–99)
Glucose-Capillary: 121 mg/dL — ABNORMAL HIGH (ref 70–99)
Glucose-Capillary: 122 mg/dL — ABNORMAL HIGH (ref 70–99)
Glucose-Capillary: 122 mg/dL — ABNORMAL HIGH (ref 70–99)
Glucose-Capillary: 123 mg/dL — ABNORMAL HIGH (ref 70–99)
Glucose-Capillary: 123 mg/dL — ABNORMAL HIGH (ref 70–99)
Glucose-Capillary: 123 mg/dL — ABNORMAL HIGH (ref 70–99)
Glucose-Capillary: 123 mg/dL — ABNORMAL HIGH (ref 70–99)
Glucose-Capillary: 123 mg/dL — ABNORMAL HIGH (ref 70–99)
Glucose-Capillary: 123 mg/dL — ABNORMAL HIGH (ref 70–99)
Glucose-Capillary: 124 mg/dL — ABNORMAL HIGH (ref 70–99)
Glucose-Capillary: 124 mg/dL — ABNORMAL HIGH (ref 70–99)
Glucose-Capillary: 125 mg/dL — ABNORMAL HIGH (ref 70–99)
Glucose-Capillary: 125 mg/dL — ABNORMAL HIGH (ref 70–99)
Glucose-Capillary: 125 mg/dL — ABNORMAL HIGH (ref 70–99)
Glucose-Capillary: 125 mg/dL — ABNORMAL HIGH (ref 70–99)
Glucose-Capillary: 126 mg/dL — ABNORMAL HIGH (ref 70–99)
Glucose-Capillary: 126 mg/dL — ABNORMAL HIGH (ref 70–99)
Glucose-Capillary: 126 mg/dL — ABNORMAL HIGH (ref 70–99)
Glucose-Capillary: 127 mg/dL — ABNORMAL HIGH (ref 70–99)
Glucose-Capillary: 127 mg/dL — ABNORMAL HIGH (ref 70–99)
Glucose-Capillary: 127 mg/dL — ABNORMAL HIGH (ref 70–99)
Glucose-Capillary: 127 mg/dL — ABNORMAL HIGH (ref 70–99)
Glucose-Capillary: 128 mg/dL — ABNORMAL HIGH (ref 70–99)
Glucose-Capillary: 128 mg/dL — ABNORMAL HIGH (ref 70–99)
Glucose-Capillary: 130 mg/dL — ABNORMAL HIGH (ref 70–99)
Glucose-Capillary: 130 mg/dL — ABNORMAL HIGH (ref 70–99)
Glucose-Capillary: 130 mg/dL — ABNORMAL HIGH (ref 70–99)
Glucose-Capillary: 130 mg/dL — ABNORMAL HIGH (ref 70–99)
Glucose-Capillary: 132 mg/dL — ABNORMAL HIGH (ref 70–99)
Glucose-Capillary: 133 mg/dL — ABNORMAL HIGH (ref 70–99)
Glucose-Capillary: 133 mg/dL — ABNORMAL HIGH (ref 70–99)
Glucose-Capillary: 134 mg/dL — ABNORMAL HIGH (ref 70–99)
Glucose-Capillary: 134 mg/dL — ABNORMAL HIGH (ref 70–99)
Glucose-Capillary: 134 mg/dL — ABNORMAL HIGH (ref 70–99)
Glucose-Capillary: 135 mg/dL — ABNORMAL HIGH (ref 70–99)
Glucose-Capillary: 135 mg/dL — ABNORMAL HIGH (ref 70–99)
Glucose-Capillary: 135 mg/dL — ABNORMAL HIGH (ref 70–99)
Glucose-Capillary: 135 mg/dL — ABNORMAL HIGH (ref 70–99)
Glucose-Capillary: 136 mg/dL — ABNORMAL HIGH (ref 70–99)
Glucose-Capillary: 136 mg/dL — ABNORMAL HIGH (ref 70–99)
Glucose-Capillary: 136 mg/dL — ABNORMAL HIGH (ref 70–99)
Glucose-Capillary: 136 mg/dL — ABNORMAL HIGH (ref 70–99)
Glucose-Capillary: 137 mg/dL — ABNORMAL HIGH (ref 70–99)
Glucose-Capillary: 139 mg/dL — ABNORMAL HIGH (ref 70–99)
Glucose-Capillary: 139 mg/dL — ABNORMAL HIGH (ref 70–99)
Glucose-Capillary: 140 mg/dL — ABNORMAL HIGH (ref 70–99)
Glucose-Capillary: 140 mg/dL — ABNORMAL HIGH (ref 70–99)
Glucose-Capillary: 141 mg/dL — ABNORMAL HIGH (ref 70–99)
Glucose-Capillary: 142 mg/dL — ABNORMAL HIGH (ref 70–99)
Glucose-Capillary: 144 mg/dL — ABNORMAL HIGH (ref 70–99)
Glucose-Capillary: 146 mg/dL — ABNORMAL HIGH (ref 70–99)
Glucose-Capillary: 146 mg/dL — ABNORMAL HIGH (ref 70–99)
Glucose-Capillary: 146 mg/dL — ABNORMAL HIGH (ref 70–99)
Glucose-Capillary: 146 mg/dL — ABNORMAL HIGH (ref 70–99)
Glucose-Capillary: 148 mg/dL — ABNORMAL HIGH (ref 70–99)
Glucose-Capillary: 149 mg/dL — ABNORMAL HIGH (ref 70–99)
Glucose-Capillary: 149 mg/dL — ABNORMAL HIGH (ref 70–99)
Glucose-Capillary: 151 mg/dL — ABNORMAL HIGH (ref 70–99)
Glucose-Capillary: 152 mg/dL — ABNORMAL HIGH (ref 70–99)
Glucose-Capillary: 156 mg/dL — ABNORMAL HIGH (ref 70–99)
Glucose-Capillary: 158 mg/dL — ABNORMAL HIGH (ref 70–99)
Glucose-Capillary: 162 mg/dL — ABNORMAL HIGH (ref 70–99)
Glucose-Capillary: 166 mg/dL — ABNORMAL HIGH (ref 70–99)
Glucose-Capillary: 175 mg/dL — ABNORMAL HIGH (ref 70–99)
Glucose-Capillary: 177 mg/dL — ABNORMAL HIGH (ref 70–99)
Glucose-Capillary: 88 mg/dL (ref 70–99)
Glucose-Capillary: 89 mg/dL (ref 70–99)

## 2010-08-04 LAB — CBC
HCT: 31.5 % — ABNORMAL LOW (ref 36.0–46.0)
HCT: 32.2 % — ABNORMAL LOW (ref 36.0–46.0)
HCT: 32.4 % — ABNORMAL LOW (ref 36.0–46.0)
HCT: 32.5 % — ABNORMAL LOW (ref 36.0–46.0)
HCT: 32.9 % — ABNORMAL LOW (ref 36.0–46.0)
HCT: 33 % — ABNORMAL LOW (ref 36.0–46.0)
HCT: 33.5 % — ABNORMAL LOW (ref 36.0–46.0)
HCT: 33.6 % — ABNORMAL LOW (ref 36.0–46.0)
HCT: 34.9 % — ABNORMAL LOW (ref 36.0–46.0)
Hemoglobin: 10.3 g/dL — ABNORMAL LOW (ref 12.0–15.0)
Hemoglobin: 10.3 g/dL — ABNORMAL LOW (ref 12.0–15.0)
Hemoglobin: 10.4 g/dL — ABNORMAL LOW (ref 12.0–15.0)
Hemoglobin: 10.5 g/dL — ABNORMAL LOW (ref 12.0–15.0)
Hemoglobin: 10.8 g/dL — ABNORMAL LOW (ref 12.0–15.0)
Hemoglobin: 10.8 g/dL — ABNORMAL LOW (ref 12.0–15.0)
Hemoglobin: 10.9 g/dL — ABNORMAL LOW (ref 12.0–15.0)
Hemoglobin: 11.1 g/dL — ABNORMAL LOW (ref 12.0–15.0)
Hemoglobin: 9.9 g/dL — ABNORMAL LOW (ref 12.0–15.0)
MCHC: 31.5 g/dL (ref 30.0–36.0)
MCHC: 31.8 g/dL (ref 30.0–36.0)
MCHC: 31.8 g/dL (ref 30.0–36.0)
MCHC: 31.9 g/dL (ref 30.0–36.0)
MCHC: 32 g/dL (ref 30.0–36.0)
MCHC: 32 g/dL (ref 30.0–36.0)
MCHC: 32.2 g/dL (ref 30.0–36.0)
MCHC: 32.6 g/dL (ref 30.0–36.0)
MCHC: 32.6 g/dL (ref 30.0–36.0)
MCV: 77.4 fL — ABNORMAL LOW (ref 78.0–100.0)
MCV: 77.8 fL — ABNORMAL LOW (ref 78.0–100.0)
MCV: 77.9 fL — ABNORMAL LOW (ref 78.0–100.0)
MCV: 78 fL (ref 78.0–100.0)
MCV: 78.1 fL (ref 78.0–100.0)
MCV: 78.6 fL (ref 78.0–100.0)
MCV: 78.8 fL (ref 78.0–100.0)
MCV: 78.9 fL (ref 78.0–100.0)
MCV: 78.9 fL (ref 78.0–100.0)
Platelets: 191 10*3/uL (ref 150–400)
Platelets: 220 10*3/uL (ref 150–400)
Platelets: 237 10*3/uL (ref 150–400)
Platelets: 238 10*3/uL (ref 150–400)
Platelets: 258 10*3/uL (ref 150–400)
Platelets: 299 10*3/uL (ref 150–400)
Platelets: 331 10*3/uL (ref 150–400)
Platelets: 361 10*3/uL (ref 150–400)
Platelets: 378 10*3/uL (ref 150–400)
RBC: 3.99 MIL/uL (ref 3.87–5.11)
RBC: 4.08 MIL/uL (ref 3.87–5.11)
RBC: 4.15 MIL/uL (ref 3.87–5.11)
RBC: 4.17 MIL/uL (ref 3.87–5.11)
RBC: 4.19 MIL/uL (ref 3.87–5.11)
RBC: 4.24 MIL/uL (ref 3.87–5.11)
RBC: 4.3 MIL/uL (ref 3.87–5.11)
RBC: 4.33 MIL/uL (ref 3.87–5.11)
RBC: 4.42 MIL/uL (ref 3.87–5.11)
RDW: 12.7 % (ref 11.5–15.5)
RDW: 12.8 % (ref 11.5–15.5)
RDW: 12.9 % (ref 11.5–15.5)
RDW: 12.9 % (ref 11.5–15.5)
RDW: 12.9 % (ref 11.5–15.5)
RDW: 13 % (ref 11.5–15.5)
RDW: 13.1 % (ref 11.5–15.5)
RDW: 13.2 % (ref 11.5–15.5)
RDW: 13.9 % (ref 11.5–15.5)
WBC: 11 10*3/uL — ABNORMAL HIGH (ref 4.0–10.5)
WBC: 11.2 10*3/uL — ABNORMAL HIGH (ref 4.0–10.5)
WBC: 11.5 10*3/uL — ABNORMAL HIGH (ref 4.0–10.5)
WBC: 11.7 10*3/uL — ABNORMAL HIGH (ref 4.0–10.5)
WBC: 11.8 10*3/uL — ABNORMAL HIGH (ref 4.0–10.5)
WBC: 12.2 10*3/uL — ABNORMAL HIGH (ref 4.0–10.5)
WBC: 7.8 10*3/uL (ref 4.0–10.5)
WBC: 8.8 10*3/uL (ref 4.0–10.5)
WBC: 9.3 10*3/uL (ref 4.0–10.5)

## 2010-08-04 LAB — COMPREHENSIVE METABOLIC PANEL
ALT: 29 U/L (ref 0–35)
AST: 20 U/L (ref 0–37)
Albumin: 3.3 g/dL — ABNORMAL LOW (ref 3.5–5.2)
Alkaline Phosphatase: 69 U/L (ref 39–117)
BUN: 3 mg/dL — ABNORMAL LOW (ref 6–23)
CO2: 28 mEq/L (ref 19–32)
Calcium: 9.2 mg/dL (ref 8.4–10.5)
Chloride: 101 mEq/L (ref 96–112)
Creatinine, Ser: 0.48 mg/dL (ref 0.4–1.2)
GFR calc Af Amer: >60 mL/min (ref >60)
GFR calc non Af Amer: >60 mL/min (ref >60)
Glucose, Bld: 110 mg/dL — ABNORMAL HIGH (ref 70–99)
Potassium: 4 mEq/L (ref 3.5–5.1)
Sodium: 141 mEq/L (ref 135–145)
Total Bilirubin: 0.4 mg/dL (ref 0.3–1.2)
Total Protein: 6.8 g/dL (ref 6.0–8.3)

## 2010-08-04 LAB — PHOSPHORUS
Phosphorus: 3.6 mg/dL (ref 2.3–4.6)
Phosphorus: 4.7 mg/dL — ABNORMAL HIGH (ref 2.3–4.6)

## 2010-08-04 LAB — MAGNESIUM
Magnesium: 1.3 mg/dL — ABNORMAL LOW (ref 1.5–2.5)
Magnesium: 1.3 mg/dL — ABNORMAL LOW (ref 1.5–2.5)
Magnesium: 1.5 mg/dL (ref 1.5–2.5)

## 2010-08-04 LAB — CARDIAC PANEL(CRET KIN+CKTOT+MB+TROPI)
CK, MB: 1 ng/mL (ref 0.3–4.0)
Relative Index: INVALID (ref 0.0–2.5)
Total CK: 28 U/L (ref 7–177)
Troponin I: 0.01 ng/mL (ref 0.00–0.06)

## 2010-08-04 LAB — URINALYSIS, ROUTINE W REFLEX MICROSCOPIC
Bilirubin Urine: NEGATIVE
Glucose, UA: NEGATIVE mg/dL
Hgb urine dipstick: NEGATIVE
Ketones, ur: NEGATIVE mg/dL
Nitrite: NEGATIVE
Protein, ur: NEGATIVE mg/dL
Specific Gravity, Urine: 1.019 (ref 1.005–1.030)
Urobilinogen, UA: 0.2 mg/dL (ref 0.0–1.0)
pH: 7 (ref 5.0–8.0)

## 2010-08-04 LAB — URINE CULTURE: Colony Count: >=100000

## 2010-08-04 LAB — TSH: TSH: 1.244 u[IU]/mL (ref 0.350–4.500)

## 2010-08-04 LAB — PREALBUMIN: Prealbumin: 24 mg/dL (ref 18.0–45.0)

## 2010-08-05 ENCOUNTER — Other Ambulatory Visit: Payer: Self-pay | Admitting: Internal Medicine

## 2010-08-05 LAB — COMPREHENSIVE METABOLIC PANEL
ALT: 16 U/L (ref 0–35)
AST: 18 U/L (ref 0–37)
Albumin: 4.2 g/dL (ref 3.5–5.2)
Alkaline Phosphatase: 55 U/L (ref 39–117)
BUN: 10 mg/dL (ref 6–23)
CO2: 28 mEq/L (ref 19–32)
Calcium: 9.8 mg/dL (ref 8.4–10.5)
Chloride: 103 mEq/L (ref 96–112)
Creatinine, Ser: 0.61 mg/dL (ref 0.4–1.2)
GFR calc Af Amer: >60 mL/min (ref >60)
GFR calc non Af Amer: >60 mL/min (ref >60)
Glucose, Bld: 91 mg/dL (ref 70–99)
Potassium: 4.4 mEq/L (ref 3.5–5.1)
Sodium: 135 mEq/L (ref 135–145)
Total Bilirubin: 0.3 mg/dL (ref 0.3–1.2)
Total Protein: 6.3 g/dL (ref 6.0–8.3)

## 2010-08-05 LAB — CBC
HCT: 34.4 % — ABNORMAL LOW (ref 36.0–46.0)
HCT: 35.3 % — ABNORMAL LOW (ref 36.0–46.0)
Hemoglobin: 10.9 g/dL — ABNORMAL LOW (ref 12.0–15.0)
Hemoglobin: 11.4 g/dL — ABNORMAL LOW (ref 12.0–15.0)
MCHC: 31.8 g/dL (ref 30.0–36.0)
MCHC: 32.5 g/dL (ref 30.0–36.0)
MCV: 77.6 fL — ABNORMAL LOW (ref 78.0–100.0)
MCV: 78.4 fL (ref 78.0–100.0)
Platelets: 227 10*3/uL (ref 150–400)
Platelets: 228 10*3/uL (ref 150–400)
RBC: 4.39 MIL/uL (ref 3.87–5.11)
RBC: 4.54 MIL/uL (ref 3.87–5.11)
RDW: 13.4 % (ref 11.5–15.5)
RDW: 13.7 % (ref 11.5–15.5)
WBC: 15.5 10*3/uL — ABNORMAL HIGH (ref 4.0–10.5)
WBC: 7.1 10*3/uL (ref 4.0–10.5)

## 2010-08-05 LAB — DIFFERENTIAL
Basophils Absolute: 0 10*3/uL (ref 0.0–0.1)
Basophils Relative: 1 % (ref 0–1)
Eosinophils Absolute: 0 10*3/uL (ref 0.0–0.7)
Eosinophils Relative: 1 % (ref 0–5)
Lymphocytes Relative: 32 % (ref 12–46)
Lymphs Abs: 2.3 10*3/uL (ref 0.7–4.0)
Monocytes Absolute: 0.5 10*3/uL (ref 0.1–1.0)
Monocytes Relative: 7 % (ref 3–12)
Neutro Abs: 4.2 10*3/uL (ref 1.7–7.7)
Neutrophils Relative %: 60 % (ref 43–77)

## 2010-08-05 LAB — GLUCOSE, CAPILLARY
Glucose-Capillary: 102 mg/dL — ABNORMAL HIGH (ref 70–99)
Glucose-Capillary: 115 mg/dL — ABNORMAL HIGH (ref 70–99)
Glucose-Capillary: 138 mg/dL — ABNORMAL HIGH (ref 70–99)
Glucose-Capillary: 148 mg/dL — ABNORMAL HIGH (ref 70–99)
Glucose-Capillary: 167 mg/dL — ABNORMAL HIGH (ref 70–99)
Glucose-Capillary: 173 mg/dL — ABNORMAL HIGH (ref 70–99)
Glucose-Capillary: 176 mg/dL — ABNORMAL HIGH (ref 70–99)
Glucose-Capillary: 186 mg/dL — ABNORMAL HIGH (ref 70–99)
Glucose-Capillary: 194 mg/dL — ABNORMAL HIGH (ref 70–99)
Glucose-Capillary: 214 mg/dL — ABNORMAL HIGH (ref 70–99)

## 2010-08-05 LAB — BASIC METABOLIC PANEL
BUN: 5 mg/dL — ABNORMAL LOW (ref 6–23)
CO2: 24 mEq/L (ref 19–32)
Calcium: 8.4 mg/dL (ref 8.4–10.5)
Chloride: 93 mEq/L — ABNORMAL LOW (ref 96–112)
Creatinine, Ser: 0.5 mg/dL (ref 0.4–1.2)
GFR calc Af Amer: >60 mL/min (ref >60)
GFR calc non Af Amer: >60 mL/min (ref >60)
Glucose, Bld: 203 mg/dL — ABNORMAL HIGH (ref 70–99)
Potassium: 3.3 mEq/L — ABNORMAL LOW (ref 3.5–5.1)
Sodium: 126 mEq/L — ABNORMAL LOW (ref 135–145)

## 2010-08-05 LAB — PROTIME-INR
INR: 1.04 (ref 0.00–1.49)
Prothrombin Time: 13.5 seconds (ref 11.6–15.2)

## 2010-08-05 LAB — TYPE AND SCREEN
ABO/RH(D): O POS
Antibody Screen: NEGATIVE

## 2010-08-05 NOTE — Telephone Encounter (Signed)
-----   Message -----    From: Hart Carwin, MD    Sent: 08/05/2010   1:22 PM      To: Vernia Buff, CMA Subject: refill hydrocodone                             Yes, please, refill. And keep eye on how much she uses. Thanx ----- Message -----    From: Vernia Buff, CMA    Sent: 08/05/2010  11:51 AM      To: Hart Carwin, MD  Dr Juanda Chance, I got a refill request for hydrocodone elixir on Ms. Murdy. Pharmacy has made special note that patient is also taking promethazine liquid with this. Do you want me to continue refilling? This rx was given on 05/08/10 with 1 refill....  hydrocodone-acetaminophen (HYCET) 7.5-325 MG/15ML solution [Pharmacy Med Name: HYDROCOD/APAP 7.5/325 SOL]    TAKE ( 1 TABLESPOONFUL) EVERY 4-6HOURS AS NEEDED. Disp: 240 each Start: 08/05/2010 Class: Normal    Comment: SHE IS ALSO TAKING PROMETHAZINE LIQUID.    Last refill: 07/11/2010

## 2010-08-06 ENCOUNTER — Encounter: Payer: Self-pay | Admitting: Nurse Practitioner

## 2010-08-07 ENCOUNTER — Other Ambulatory Visit: Payer: Self-pay | Admitting: Internal Medicine

## 2010-08-07 ENCOUNTER — Ambulatory Visit (HOSPITAL_COMMUNITY): Payer: Medicare Other

## 2010-08-07 ENCOUNTER — Telehealth: Payer: Self-pay | Admitting: *Deleted

## 2010-08-07 ENCOUNTER — Ambulatory Visit (HOSPITAL_COMMUNITY)
Admission: RE | Admit: 2010-08-07 | Discharge: 2010-08-07 | Disposition: A | Discharge status: Home / Self Care | Payer: Medicare Other | Source: Ambulatory Visit | Attending: Internal Medicine | Admitting: Internal Medicine

## 2010-08-07 DIAGNOSIS — Z434 Encounter for attention to other artificial openings of digestive tract: Secondary | ICD-10-CM | POA: Insufficient documentation

## 2010-08-07 DIAGNOSIS — K3184 Gastroparesis: Secondary | ICD-10-CM

## 2010-08-07 DIAGNOSIS — Z01812 Encounter for preprocedural laboratory examination: Secondary | ICD-10-CM | POA: Insufficient documentation

## 2010-08-07 MED ORDER — IOHEXOL 300 MG/ML  SOLN: 15.0000 mL | Freq: Once | INTRAMUSCULAR | Status: AC | PRN

## 2010-08-07 NOTE — Telephone Encounter (Signed)
Patient has called  IR to replace her J tube because it fell out. Victorino Dike needs an order faxed to [phone]. Okay to replace J tube per Dr. Juanda Chance. Order faxed

## 2010-08-12 LAB — POCT I-STAT, CHEM 8
BUN: 14 mg/dL (ref 6–23)
Calcium, Ion: 1.15 mmol/L (ref 1.12–1.32)
Chloride: 107 mEq/L (ref 96–112)
Creatinine, Ser: 0.5 mg/dL (ref 0.4–1.2)
Glucose, Bld: 105 mg/dL — ABNORMAL HIGH (ref 70–99)
HCT: 41 % (ref 36.0–46.0)
Hemoglobin: 13.9 g/dL (ref 12.0–15.0)
Potassium: 4 mEq/L (ref 3.5–5.1)
Sodium: 141 mEq/L (ref 135–145)
TCO2: 25 mmol/L (ref 0–100)

## 2010-08-12 LAB — GLUCOSE, CAPILLARY: Glucose-Capillary: 119 mg/dL — ABNORMAL HIGH (ref 70–99)

## 2010-08-18 ENCOUNTER — Emergency Department (HOSPITAL_COMMUNITY): Payer: Medicare Other

## 2010-08-18 ENCOUNTER — Emergency Department (HOSPITAL_COMMUNITY)
Admission: EM | Admit: 2010-08-18 | Discharge: 2010-08-18 | Disposition: A | Discharge status: Home / Self Care | Payer: Medicare Other | Attending: Emergency Medicine | Admitting: Emergency Medicine

## 2010-08-18 DIAGNOSIS — R112 Nausea with vomiting, unspecified: Secondary | ICD-10-CM | POA: Insufficient documentation

## 2010-08-18 DIAGNOSIS — G8929 Other chronic pain: Secondary | ICD-10-CM | POA: Insufficient documentation

## 2010-08-18 DIAGNOSIS — K3184 Gastroparesis: Secondary | ICD-10-CM | POA: Insufficient documentation

## 2010-08-18 DIAGNOSIS — R109 Unspecified abdominal pain: Secondary | ICD-10-CM | POA: Insufficient documentation

## 2010-08-18 DIAGNOSIS — E119 Type 2 diabetes mellitus without complications: Secondary | ICD-10-CM | POA: Insufficient documentation

## 2010-08-18 DIAGNOSIS — I10 Essential (primary) hypertension: Secondary | ICD-10-CM | POA: Insufficient documentation

## 2010-08-18 DIAGNOSIS — R000 Tachycardia, unspecified: Secondary | ICD-10-CM | POA: Insufficient documentation

## 2010-08-18 DIAGNOSIS — Z931 Gastrostomy status: Secondary | ICD-10-CM | POA: Insufficient documentation

## 2010-08-18 LAB — URINE MICROSCOPIC-ADD ON

## 2010-08-18 LAB — HEMOGLOBIN A1C
Hgb A1c MFr Bld: 4.8 % (ref 4.6–6.1)
Mean Plasma Glucose: 91 mg/dL

## 2010-08-18 LAB — COMPREHENSIVE METABOLIC PANEL
ALT: 19 U/L (ref 0–35)
ALT: 30 U/L (ref 0–35)
AST: 23 U/L (ref 0–37)
AST: 37 U/L (ref 0–37)
Albumin: 4 g/dL (ref 3.5–5.2)
Albumin: 3.7 g/dL (ref 3.5–5.2)
Alkaline Phosphatase: 61 U/L (ref 39–117)
Alkaline Phosphatase: 80 U/L (ref 39–117)
BUN: 11 mg/dL (ref 6–23)
BUN: 9 mg/dL (ref 6–23)
CO2: 29 mEq/L (ref 19–32)
CO2: 25 mEq/L (ref 19–32)
Calcium: 9.4 mg/dL (ref 8.4–10.5)
Calcium: 9.1 mg/dL (ref 8.4–10.5)
Chloride: 102 mEq/L (ref 96–112)
Chloride: 102 mEq/L (ref 96–112)
Creatinine, Ser: 0.65 mg/dL (ref 0.4–1.2)
Creatinine, Ser: 0.47 mg/dL (ref 0.4–1.2)
GFR calc Af Amer: >60 mL/min (ref >60)
GFR calc Af Amer: >60 mL/min (ref >60)
GFR calc non Af Amer: >60 mL/min (ref >60)
GFR calc non Af Amer: >60 mL/min (ref >60)
Glucose, Bld: 113 mg/dL — ABNORMAL HIGH (ref 70–99)
Glucose, Bld: 113 mg/dL — ABNORMAL HIGH (ref 70–99)
Potassium: 3.6 mEq/L (ref 3.5–5.1)
Potassium: 3.9 mEq/L (ref 3.5–5.1)
Sodium: 138 mEq/L (ref 135–145)
Sodium: 137 mEq/L (ref 135–145)
Total Bilirubin: 0.8 mg/dL (ref 0.3–1.2)
Total Bilirubin: 0.4 mg/dL (ref 0.3–1.2)
Total Protein: 6.9 g/dL (ref 6.0–8.3)
Total Protein: 7.8 g/dL (ref 6.0–8.3)

## 2010-08-18 LAB — PROTIME-INR
INR: 1 (ref 0.00–1.49)
Prothrombin Time: 13.8 seconds (ref 11.6–15.2)

## 2010-08-18 LAB — BASIC METABOLIC PANEL
BUN: 3 mg/dL — ABNORMAL LOW (ref 6–23)
BUN: 3 mg/dL — ABNORMAL LOW (ref 6–23)
BUN: 7 mg/dL (ref 6–23)
CO2: 26 mEq/L (ref 19–32)
CO2: 27 mEq/L (ref 19–32)
CO2: 28 mEq/L (ref 19–32)
Calcium: 8.6 mg/dL (ref 8.4–10.5)
Calcium: 8.7 mg/dL (ref 8.4–10.5)
Calcium: 8.8 mg/dL (ref 8.4–10.5)
Chloride: 107 mEq/L (ref 96–112)
Chloride: 107 mEq/L (ref 96–112)
Chloride: 108 mEq/L (ref 96–112)
Creatinine, Ser: 0.54 mg/dL (ref 0.4–1.2)
Creatinine, Ser: 0.55 mg/dL (ref 0.4–1.2)
Creatinine, Ser: 0.57 mg/dL (ref 0.4–1.2)
GFR calc Af Amer: >60 mL/min (ref >60)
GFR calc Af Amer: >60 mL/min (ref >60)
GFR calc Af Amer: >60 mL/min (ref >60)
GFR calc non Af Amer: >60 mL/min (ref >60)
GFR calc non Af Amer: >60 mL/min (ref >60)
GFR calc non Af Amer: >60 mL/min (ref >60)
Glucose, Bld: 118 mg/dL — ABNORMAL HIGH (ref 70–99)
Glucose, Bld: 120 mg/dL — ABNORMAL HIGH (ref 70–99)
Glucose, Bld: 93 mg/dL (ref 70–99)
Potassium: 3.1 mEq/L — ABNORMAL LOW (ref 3.5–5.1)
Potassium: 3.1 mEq/L — ABNORMAL LOW (ref 3.5–5.1)
Potassium: 4.3 mEq/L (ref 3.5–5.1)
Sodium: 139 mEq/L (ref 135–145)
Sodium: 140 mEq/L (ref 135–145)
Sodium: 142 mEq/L (ref 135–145)

## 2010-08-18 LAB — CARDIAC PANEL(CRET KIN+CKTOT+MB+TROPI)
CK, MB: 0.6 ng/mL (ref 0.3–4.0)
CK, MB: 0.7 ng/mL (ref 0.3–4.0)
Relative Index: INVALID (ref 0.0–2.5)
Relative Index: INVALID (ref 0.0–2.5)
Total CK: 45 U/L (ref 7–177)
Total CK: 53 U/L (ref 7–177)
Troponin I: 0.02 ng/mL (ref 0.00–0.06)
Troponin I: <0.01 ng/mL (ref 0.00–0.06)

## 2010-08-18 LAB — URINALYSIS, ROUTINE W REFLEX MICROSCOPIC
Bilirubin Urine: NEGATIVE
Glucose, UA: NEGATIVE mg/dL
Glucose, UA: NEGATIVE mg/dL
Hgb urine dipstick: NEGATIVE
Hgb urine dipstick: NEGATIVE
Ketones, ur: 15 mg/dL — AB
Ketones, ur: NEGATIVE mg/dL
Nitrite: NEGATIVE
Nitrite: NEGATIVE
Protein, ur: 30 mg/dL — AB
Protein, ur: NEGATIVE mg/dL
Specific Gravity, Urine: 1.026 (ref 1.005–1.030)
Specific Gravity, Urine: 1.044 — ABNORMAL HIGH (ref 1.005–1.030)
Urobilinogen, UA: 1 mg/dL (ref 0.0–1.0)
Urobilinogen, UA: 1 mg/dL (ref 0.0–1.0)
pH: 6 (ref 5.0–8.0)
pH: 7 (ref 5.0–8.0)

## 2010-08-18 LAB — CBC
HCT: 30.1 % — ABNORMAL LOW (ref 36.0–46.0)
HCT: 33.4 % — ABNORMAL LOW (ref 36.0–46.0)
HCT: 34.5 % — ABNORMAL LOW (ref 36.0–46.0)
HCT: 36.1 % (ref 36.0–46.0)
HCT: 37.2 % (ref 36.0–46.0)
HCT: 37.7 % (ref 36.0–46.0)
Hemoglobin: 10.3 g/dL — ABNORMAL LOW (ref 12.0–15.0)
Hemoglobin: 10.8 g/dL — ABNORMAL LOW (ref 12.0–15.0)
Hemoglobin: 11.1 g/dL — ABNORMAL LOW (ref 12.0–15.0)
Hemoglobin: 11.8 g/dL — ABNORMAL LOW (ref 12.0–15.0)
Hemoglobin: 11.8 g/dL — ABNORMAL LOW (ref 12.0–15.0)
Hemoglobin: 9.4 g/dL — ABNORMAL LOW (ref 12.0–15.0)
MCH: 24.6 pg — ABNORMAL LOW (ref 26.0–34.0)
MCHC: 30.8 g/dL (ref 30.0–36.0)
MCHC: 31 g/dL (ref 30.0–36.0)
MCHC: 31.2 g/dL (ref 30.0–36.0)
MCHC: 31.3 g/dL (ref 30.0–36.0)
MCHC: 31.4 g/dL (ref 30.0–36.0)
MCHC: 31.7 g/dL (ref 30.0–36.0)
MCV: 77.5 fL — ABNORMAL LOW (ref 78.0–100.0)
MCV: 77.8 fL — ABNORMAL LOW (ref 78.0–100.0)
MCV: 78.2 fL (ref 78.0–100.0)
MCV: 78.5 fL (ref 78.0–100.0)
MCV: 78.6 fL (ref 78.0–100.0)
MCV: 79 fL (ref 78.0–100.0)
Platelets: 233 10*3/uL (ref 150–400)
Platelets: 243 10*3/uL (ref 150–400)
Platelets: 278 10*3/uL (ref 150–400)
Platelets: 286 10*3/uL (ref 150–400)
Platelets: 289 10*3/uL (ref 150–400)
Platelets: 289 10*3/uL (ref 150–400)
RBC: 3.83 MIL/uL — ABNORMAL LOW (ref 3.87–5.11)
RBC: 4.27 MIL/uL (ref 3.87–5.11)
RBC: 4.4 MIL/uL (ref 3.87–5.11)
RBC: 4.57 MIL/uL (ref 3.87–5.11)
RBC: 4.8 MIL/uL (ref 3.87–5.11)
RBC: 4.84 MIL/uL (ref 3.87–5.11)
RDW: 12.8 % (ref 11.5–15.5)
RDW: 12.9 % (ref 11.5–15.5)
RDW: 13.1 % (ref 11.5–15.5)
RDW: 13.4 % (ref 11.5–15.5)
RDW: 13.4 % (ref 11.5–15.5)
RDW: 14.3 % (ref 11.5–15.5)
WBC: 12.3 10*3/uL — ABNORMAL HIGH (ref 4.0–10.5)
WBC: 5.6 10*3/uL (ref 4.0–10.5)
WBC: 5.8 10*3/uL (ref 4.0–10.5)
WBC: 6.3 10*3/uL (ref 4.0–10.5)
WBC: 6.5 10*3/uL (ref 4.0–10.5)
WBC: 6.8 10*3/uL (ref 4.0–10.5)

## 2010-08-18 LAB — GLUCOSE, CAPILLARY
Glucose-Capillary: 101 mg/dL — ABNORMAL HIGH (ref 70–99)
Glucose-Capillary: 103 mg/dL — ABNORMAL HIGH (ref 70–99)
Glucose-Capillary: 119 mg/dL — ABNORMAL HIGH (ref 70–99)
Glucose-Capillary: 150 mg/dL — ABNORMAL HIGH (ref 70–99)
Glucose-Capillary: 94 mg/dL (ref 70–99)

## 2010-08-18 LAB — CULTURE, BLOOD (ROUTINE X 2)
Culture: NO GROWTH
Culture: NO GROWTH
Culture: NO GROWTH

## 2010-08-18 LAB — DIFFERENTIAL
Basophils Absolute: 0 10*3/uL (ref 0.0–0.1)
Basophils Absolute: 0 10*3/uL (ref 0.0–0.1)
Basophils Relative: 0 % (ref 0–1)
Basophils Relative: 1 % (ref 0–1)
Eosinophils Absolute: 0 10*3/uL (ref 0.0–0.7)
Eosinophils Absolute: 0 10*3/uL (ref 0.0–0.7)
Eosinophils Relative: 0 % (ref 0–5)
Eosinophils Relative: 0 % (ref 0–5)
Lymphocytes Relative: 10 % — ABNORMAL LOW (ref 12–46)
Lymphocytes Relative: 36 % (ref 12–46)
Lymphs Abs: 1.3 10*3/uL (ref 0.7–4.0)
Lymphs Abs: 2 10*3/uL (ref 0.7–4.0)
Monocytes Absolute: 0.5 10*3/uL (ref 0.1–1.0)
Monocytes Absolute: 0.5 10*3/uL (ref 0.1–1.0)
Monocytes Relative: 4 % (ref 3–12)
Monocytes Relative: 9 % (ref 3–12)
Neutro Abs: 10.5 10*3/uL — ABNORMAL HIGH (ref 1.7–7.7)
Neutro Abs: 3.1 10*3/uL (ref 1.7–7.7)
Neutrophils Relative %: 55 % (ref 43–77)
Neutrophils Relative %: 85 % — ABNORMAL HIGH (ref 43–77)

## 2010-08-18 LAB — CK TOTAL AND CKMB (NOT AT ARMC)
CK, MB: 0.6 ng/mL (ref 0.3–4.0)
Relative Index: INVALID (ref 0.0–2.5)
Total CK: 49 U/L (ref 7–177)

## 2010-08-18 LAB — TYPE AND SCREEN
ABO/RH(D): O POS
Antibody Screen: NEGATIVE

## 2010-08-18 LAB — LACTIC ACID, PLASMA: Lactic Acid, Venous: 1.6 mmol/L (ref 0.5–2.2)

## 2010-08-18 LAB — HEMOCCULT GUIAC POC 1CARD (OFFICE): Fecal Occult Bld: POSITIVE

## 2010-08-18 LAB — LIPASE, BLOOD
Lipase: 25 U/L (ref 11–59)
Lipase: 35 U/L (ref 11–59)

## 2010-08-18 LAB — TROPONIN I: Troponin I: <0.01 ng/mL (ref 0.00–0.06)

## 2010-08-18 LAB — MAGNESIUM
Magnesium: 1.9 mg/dL (ref 1.5–2.5)
Magnesium: 1.9 mg/dL (ref 1.5–2.5)

## 2010-08-18 LAB — APTT: aPTT: 32 seconds (ref 24–37)

## 2010-08-18 LAB — ETHANOL: Alcohol, Ethyl (B): <5 mg/dL (ref 0–10)

## 2010-08-18 LAB — TSH: TSH: 1.133 u[IU]/mL (ref 0.350–4.500)

## 2010-08-19 ENCOUNTER — Encounter: Payer: Self-pay | Admitting: Internal Medicine

## 2010-08-19 ENCOUNTER — Other Ambulatory Visit (INDEPENDENT_AMBULATORY_CARE_PROVIDER_SITE_OTHER): Payer: Medicare Other

## 2010-08-19 ENCOUNTER — Ambulatory Visit (INDEPENDENT_AMBULATORY_CARE_PROVIDER_SITE_OTHER): Payer: Medicare Other | Admitting: Internal Medicine

## 2010-08-19 DIAGNOSIS — R1033 Periumbilical pain: Secondary | ICD-10-CM

## 2010-08-19 DIAGNOSIS — K3184 Gastroparesis: Secondary | ICD-10-CM

## 2010-08-19 LAB — URINE CULTURE
Colony Count: 35000
Culture  Setup Time: 201204161041

## 2010-08-19 MED ORDER — HYDROCODONE-ACETAMINOPHEN 7.5-325 MG/15ML PO SOLN: ORAL | Status: DC

## 2010-08-19 NOTE — Patient Instructions (Addendum)
Please go to the lab for a CBC and Renal profile Please pick up your hydrocodone at the pharmacy. You will need a 6 week follow up with Dr Juanda Chance. Dr [facility], sugery Dr Beverly Gust,
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
## 2010-08-20 ENCOUNTER — Other Ambulatory Visit: Payer: Self-pay | Admitting: Internal Medicine

## 2010-08-20 NOTE — Telephone Encounter (Signed)
Dr Juanda Chance- Patient is requesting refills on phenergan liquid. Does she still need to be taking this??

## 2010-08-21 ENCOUNTER — Telehealth: Payer: Self-pay | Admitting: Internal Medicine

## 2010-08-21 ENCOUNTER — Telehealth: Payer: Self-pay | Admitting: *Deleted

## 2010-08-21 MED ORDER — PROMETHAZINE HCL 6.25 MG/5ML PO SYRP: ORAL_SOLUTION | ORAL | Status: DC

## 2010-08-21 NOTE — Telephone Encounter (Signed)
Diannia Ruder, pharmacist with [facility] called to state that she went to fill patient's rx of phenergan liquid and came upon a couple of findings: 1) Patient is getting promethazine from [facility] [place] [facility] from both Dr Juanda Chance and Dr Mervyn Skeeters. Wilson as well as from Dr Carolynn Sayers    07/22/10 she received 750 ml promethazine from [facility]   07/26/10 she received 170 ml promethazine from Dr Andrey Campanile  08/05/10 she received 240 ml promethazine from Dr Andrey Campanile  08/18/10 she received 240 ml promethazine from Dr Andrey Campanile  08/20/10 we sent rx (however, I have now had that d/c'ed)  2) Patient is getting vicodin elixir from [facility] from Dr Juanda Chance and Dr Carolynn Sayers  07/11/10 she received vicodin elixir 240 ml from Dr Juanda Chance (date rx written was 05/08/10)  07/16/10 she received vicodin elixir 750 ml from Dr C. Wescott (date written 07/16/10)  07/28/10 she received vicodin elixir 750 ml from Dr Sherald Barge (date written 07/28/10)  08/05/10 she received vicodin elixir 240 ml from Dr D. Juanda Chance (date written 08/05/10)  08/11/10 she received vicodin elixir 750 ml from Dr Sherald Barge (date written 08/11/10)  08/18/10 she received vicodin elixir 750 ml from Dr Sherald Barge   08/19/10 we sent in rx (however, I have now had that d/c'ed)  So, Do you want to let Dr Carolynn Sayers be in charge of the promethazine and vicodin elixir or do you want to be in charge of it? I have spoken to Jasmine December, pharmacist at [facility] and there are refills left on the promethazine from Dr Carolynn Sayers but none from Dr Juanda Chance. I have also made sure that there are no refills remaining on vicodin elixir from Dr Juanda Chance. I have cancelled both the promethazine and hydrocodone elixir from [facility] as well until I get Dr Regino Schultze response.

## 2010-08-21 NOTE — Telephone Encounter (Signed)
Spoke to [place] at [facility]. She will d/c rx for #750 mg with 1 refill that was approved yesterday in error (patient has not yet picked it up). She will instead give her the updated rx I sent today for promethazine 6.25 mg liquid #300 ml not to exceed 1 dose per day.

## 2010-08-21 NOTE — Telephone Encounter (Signed)
Spoke with patient. States she saw Dr. [name] yesterday. After she left, she started feeling sick and "nervous on the inside."States she had pain at the J Tube site but it does not look any different than usual. Vomited last night. Last night, she ate a little and felt sick.Has not eaten anything today. States it does not feel like muscle spasms in stomach.She has no appetite. Took Hydrocodone and Phenergan this AM.  Spoke with Dr. Juanda Chance and she suggests Ativan 0.5 mg BID if patient is not on it already.Spoke with patient and she states she takes Ativan 0.5 mg TID. She took one at 5 AM and again a short time ago. Please, advise

## 2010-08-21 NOTE — Telephone Encounter (Signed)
Please tell pt to  Use Dr [name] for pain meds and antiemetics until he releases her from his care. Then we will pick up the prescriptions again. Also,  Should not she be calling Dr [name] concerning her nausea so he knows what's going on??

## 2010-08-21 NOTE — Telephone Encounter (Signed)
Patient notified of Dr. Regino Schultze recommendations. She states she has Phenergan rx that she just had refilled. She wants me to call the lab and let them know she is not coming today for labs but will come tomorrow. Lab notified.

## 2010-08-21 NOTE — Telephone Encounter (Signed)
She may continue to use her promethazine liquid , , OK to refill, eat small amounts of food, I will wait for Dr Coral Else report

## 2010-08-21 NOTE — Telephone Encounter (Signed)
-----   Message -----    From: Hart Carwin, MD    Sent: 08/21/2010  12:49 PM      To: Vernia Buff, CMA Subject: promethazine liquid                            Continue to refill  promethazine not to exceed 1 dose/day ----- Message -----    From: Vernia Buff, CMA    Sent: 08/21/2010   8:14 AM      To: Hart Carwin, MD  Dr Juanda Chance- Patient is requesting refills on promethazine liquid. Does she still need to be taking this post gastrectomy?

## 2010-08-21 NOTE — Telephone Encounter (Signed)
Spoke with patient to advise her that we have received information from [facility] and [facility] pharmacy that she has gotten promethazine and Vicodin elixir from multiple physicians within a short amount of time. She says that she hardly has any of the promethazine or Vicodin elixir left. I have asked that she get further refills of promethazine and Vicodin from Dr Carolynn Sayers until he releases her from his care (as she has been getting the Vicodin and promethazine primarily from him anyway). Patient says that she was getting the rx at 2 different pharmacies because we sent it to the wrong pharmacy one time so she had to continue to get it there. I have explained that while this may be the case, it still doesn't account for her getting the rx from other physicians from [facility] and [facility]. Explained that although she is not being malicious trying to get "too much medicine," it still doesn't look good for her to get the same medications from multiple physicians within a short time and it is not healthy for her to take that much medication in that time frame. I also explained that it is in her best interest to get her medications from one physician. Asked that she call Dr Carolynn Sayers regarding her nausea so that he can get a better picture of how bad her nausea truly is. I also reiterated the fact that until we get a note from Dr Carolynn Sayers that he has released her from his care, that she should get her promethazine and Vicodin prescriptions from him. She verbalizes understanding.

## 2010-08-21 NOTE — Telephone Encounter (Signed)
Emily [name], pharmacist with [facility] pharmacy called to state that

## 2010-08-22 LAB — CBC WITH DIFFERENTIAL/PLATELET
Basophils Absolute: 0.1 10*3/uL (ref 0.0–0.1)
Basophils Relative: 0.4 % (ref 0.0–3.0)
Eosinophils Absolute: 0.4 10*3/uL (ref 0.0–0.7)
Eosinophils Relative: 2.3 % (ref 0.0–5.0)
HCT: 37.5 % (ref 36.0–46.0)
Hemoglobin: 11.9 g/dL — ABNORMAL LOW (ref 12.0–15.0)
Lymphocytes Relative: 10.5 % — ABNORMAL LOW (ref 12.0–46.0)
Lymphs Abs: 1.6 10*3/uL (ref 0.7–4.0)
MCHC: 31.7 g/dL (ref 30.0–36.0)
MCV: 80.3 fl (ref 78.0–100.0)
Monocytes Absolute: 0.7 10*3/uL (ref 0.1–1.0)
Monocytes Relative: 4.8 % (ref 3.0–12.0)
Neutro Abs: 12.8 10*3/uL — ABNORMAL HIGH (ref 1.4–7.7)
Neutrophils Relative %: 82 % — ABNORMAL HIGH (ref 43.0–77.0)
Platelets: 259 10*3/uL (ref 150.0–400.0)
RBC: 4.67 Mil/uL (ref 3.87–5.11)
RDW: 14.3 % (ref 11.5–14.6)
WBC: 15.6 10*3/uL — ABNORMAL HIGH (ref 4.5–10.5)

## 2010-08-22 LAB — RENAL FUNCTION PANEL
Albumin: 3.7 g/dL (ref 3.5–5.2)
BUN: 9 mg/dL (ref 6–23)
CO2: 26 mEq/L (ref 19–32)
Calcium: 9.3 mg/dL (ref 8.4–10.5)
Chloride: 101 mEq/L (ref 96–112)
Creatinine, Ser: 0.5 mg/dL (ref 0.4–1.2)
GFR: 172.62 mL/min (ref >60.00)
Glucose, Bld: 89 mg/dL (ref 70–99)
Phosphorus: 3.4 mg/dL (ref 2.3–4.6)
Potassium: 3.7 mEq/L (ref 3.5–5.1)
Sodium: 138 mEq/L (ref 135–145)

## 2010-08-24 ENCOUNTER — Emergency Department (HOSPITAL_COMMUNITY): Payer: Medicare Other

## 2010-08-24 ENCOUNTER — Inpatient Hospital Stay (INDEPENDENT_AMBULATORY_CARE_PROVIDER_SITE_OTHER)
Admission: RE | Admit: 2010-08-24 | Discharge: 2010-08-24 | Disposition: A | Discharge status: Home / Self Care | Payer: Medicare Other | Source: Ambulatory Visit | Attending: Emergency Medicine | Admitting: Emergency Medicine

## 2010-08-24 ENCOUNTER — Inpatient Hospital Stay (HOSPITAL_COMMUNITY)
Admission: EM | Admit: 2010-08-24 | Discharge: 2010-09-01 | DRG: 195 | Disposition: A | Discharge status: Home Health | Payer: Medicare Other | Attending: Internal Medicine | Admitting: Internal Medicine

## 2010-08-24 DIAGNOSIS — E1149 Type 2 diabetes mellitus with other diabetic neurological complication: Secondary | ICD-10-CM | POA: Diagnosis present

## 2010-08-24 DIAGNOSIS — R197 Diarrhea, unspecified: Secondary | ICD-10-CM | POA: Diagnosis present

## 2010-08-24 DIAGNOSIS — J189 Pneumonia, unspecified organism: Principal | ICD-10-CM | POA: Diagnosis present

## 2010-08-24 DIAGNOSIS — R509 Fever, unspecified: Secondary | ICD-10-CM

## 2010-08-24 DIAGNOSIS — E440 Moderate protein-calorie malnutrition: Secondary | ICD-10-CM | POA: Diagnosis present

## 2010-08-24 DIAGNOSIS — E876 Hypokalemia: Secondary | ICD-10-CM | POA: Diagnosis present

## 2010-08-24 DIAGNOSIS — R112 Nausea with vomiting, unspecified: Secondary | ICD-10-CM

## 2010-08-24 DIAGNOSIS — IMO0002 Reserved for concepts with insufficient information to code with codable children: Secondary | ICD-10-CM

## 2010-08-24 DIAGNOSIS — D696 Thrombocytopenia, unspecified: Secondary | ICD-10-CM | POA: Diagnosis present

## 2010-08-24 DIAGNOSIS — K3184 Gastroparesis: Secondary | ICD-10-CM | POA: Diagnosis present

## 2010-08-24 DIAGNOSIS — E538 Deficiency of other specified B group vitamins: Secondary | ICD-10-CM | POA: Diagnosis present

## 2010-08-24 DIAGNOSIS — R10817 Generalized abdominal tenderness: Secondary | ICD-10-CM

## 2010-08-24 DIAGNOSIS — D72829 Elevated white blood cell count, unspecified: Secondary | ICD-10-CM | POA: Diagnosis present

## 2010-08-24 LAB — POCT URINALYSIS DIP (DEVICE)
Glucose, UA: NEGATIVE mg/dL
Hgb urine dipstick: NEGATIVE
Nitrite: NEGATIVE
Protein, ur: 30 mg/dL — AB
Specific Gravity, Urine: 1.015 (ref 1.005–1.030)
Urobilinogen, UA: 4 mg/dL — ABNORMAL HIGH (ref 0.0–1.0)
pH: 7 (ref 5.0–8.0)

## 2010-08-25 LAB — DIFFERENTIAL
Basophils Absolute: 0 10*3/uL (ref 0.0–0.1)
Basophils Relative: 0 % (ref 0–1)
Eosinophils Absolute: 0 10*3/uL (ref 0.0–0.7)
Eosinophils Relative: 0 % (ref 0–5)
Lymphocytes Relative: 6 % — ABNORMAL LOW (ref 12–46)
Lymphs Abs: 1.3 10*3/uL (ref 0.7–4.0)
Monocytes Absolute: 0.8 10*3/uL (ref 0.1–1.0)
Monocytes Relative: 4 % (ref 3–12)
Neutro Abs: 18.9 10*3/uL — ABNORMAL HIGH (ref 1.7–7.7)
Neutrophils Relative %: 90 % — ABNORMAL HIGH (ref 43–77)
WBC Morphology: INCREASED

## 2010-08-25 LAB — COMPREHENSIVE METABOLIC PANEL
ALT: 36 U/L — ABNORMAL HIGH (ref 0–35)
AST: 37 U/L (ref 0–37)
Albumin: 3 g/dL — ABNORMAL LOW (ref 3.5–5.2)
Alkaline Phosphatase: 72 U/L (ref 39–117)
BUN: 13 mg/dL (ref 6–23)
CO2: 25 mEq/L (ref 19–32)
Calcium: 8.8 mg/dL (ref 8.4–10.5)
Chloride: 105 mEq/L (ref 96–112)
Creatinine, Ser: 0.55 mg/dL (ref 0.4–1.2)
GFR calc Af Amer: >60 mL/min (ref >60)
GFR calc non Af Amer: >60 mL/min (ref >60)
Glucose, Bld: 119 mg/dL — ABNORMAL HIGH (ref 70–99)
Potassium: 3.6 mEq/L (ref 3.5–5.1)
Sodium: 138 mEq/L (ref 135–145)
Total Bilirubin: 0.5 mg/dL (ref 0.3–1.2)
Total Protein: 6.7 g/dL (ref 6.0–8.3)

## 2010-08-25 LAB — URINE MICROSCOPIC-ADD ON

## 2010-08-25 LAB — CBC
HCT: 34 % — ABNORMAL LOW (ref 36.0–46.0)
Hemoglobin: 10.8 g/dL — ABNORMAL LOW (ref 12.0–15.0)
MCH: 24.2 pg — ABNORMAL LOW (ref 26.0–34.0)
MCHC: 31.8 g/dL (ref 30.0–36.0)
MCV: 76.2 fL — ABNORMAL LOW (ref 78.0–100.0)
Platelets: 333 10*3/uL (ref 150–400)
RBC: 4.46 MIL/uL (ref 3.87–5.11)
RDW: 13.9 % (ref 11.5–15.5)
WBC: 21 10*3/uL — ABNORMAL HIGH (ref 4.0–10.5)

## 2010-08-25 LAB — URINALYSIS, ROUTINE W REFLEX MICROSCOPIC
Bilirubin Urine: NEGATIVE
Glucose, UA: NEGATIVE mg/dL
Hgb urine dipstick: NEGATIVE
Ketones, ur: NEGATIVE mg/dL
Nitrite: NEGATIVE
Protein, ur: 30 mg/dL — AB
Specific Gravity, Urine: 1.028 (ref 1.005–1.030)
Urobilinogen, UA: 2 mg/dL — ABNORMAL HIGH (ref 0.0–1.0)
pH: 7.5 (ref 5.0–8.0)

## 2010-08-25 LAB — LACTIC ACID, PLASMA: Lactic Acid, Venous: 3.2 mmol/L — ABNORMAL HIGH (ref 0.5–2.2)

## 2010-08-25 LAB — PROCALCITONIN: Procalcitonin: 2.98 ng/mL

## 2010-08-25 LAB — GLUCOSE, CAPILLARY
Glucose-Capillary: 100 mg/dL — ABNORMAL HIGH (ref 70–99)
Glucose-Capillary: 89 mg/dL (ref 70–99)
Glucose-Capillary: 77 mg/dL (ref 70–99)

## 2010-08-26 LAB — URINE CULTURE
Colony Count: NO GROWTH
Culture  Setup Time: 201204230945
Culture: NO GROWTH

## 2010-08-26 LAB — COMPREHENSIVE METABOLIC PANEL
ALT: 22 U/L (ref 0–35)
AST: 19 U/L (ref 0–37)
Albumin: 2.6 g/dL — ABNORMAL LOW (ref 3.5–5.2)
Alkaline Phosphatase: 65 U/L (ref 39–117)
BUN: 8 mg/dL (ref 6–23)
CO2: 26 mEq/L (ref 19–32)
Calcium: 8.5 mg/dL (ref 8.4–10.5)
Chloride: 108 mEq/L (ref 96–112)
Creatinine, Ser: 0.53 mg/dL (ref 0.4–1.2)
GFR calc Af Amer: >60 mL/min (ref >60)
GFR calc non Af Amer: >60 mL/min (ref >60)
Glucose, Bld: 89 mg/dL (ref 70–99)
Potassium: 3 mEq/L — ABNORMAL LOW (ref 3.5–5.1)
Sodium: 140 mEq/L (ref 135–145)
Total Bilirubin: 0.6 mg/dL (ref 0.3–1.2)
Total Protein: 6.1 g/dL (ref 6.0–8.3)

## 2010-08-26 LAB — CBC
HCT: 30.4 % — ABNORMAL LOW (ref 36.0–46.0)
Hemoglobin: 9.8 g/dL — ABNORMAL LOW (ref 12.0–15.0)
MCH: 24.7 pg — ABNORMAL LOW (ref 26.0–34.0)
MCHC: 32.2 g/dL (ref 30.0–36.0)
MCV: 76.8 fL — ABNORMAL LOW (ref 78.0–100.0)
Platelets: 315 10*3/uL (ref 150–400)
RBC: 3.96 MIL/uL (ref 3.87–5.11)
RDW: 13.7 % (ref 11.5–15.5)
WBC: 14.9 10*3/uL — ABNORMAL HIGH (ref 4.0–10.5)

## 2010-08-26 LAB — GLUCOSE, CAPILLARY
Glucose-Capillary: 101 mg/dL — ABNORMAL HIGH (ref 70–99)
Glucose-Capillary: 108 mg/dL — ABNORMAL HIGH (ref 70–99)
Glucose-Capillary: 110 mg/dL — ABNORMAL HIGH (ref 70–99)
Glucose-Capillary: 119 mg/dL — ABNORMAL HIGH (ref 70–99)
Glucose-Capillary: 129 mg/dL — ABNORMAL HIGH (ref 70–99)
Glucose-Capillary: 144 mg/dL — ABNORMAL HIGH (ref 70–99)
Glucose-Capillary: 65 mg/dL — ABNORMAL LOW (ref 70–99)

## 2010-08-26 LAB — HEMOGLOBIN A1C
Hgb A1c MFr Bld: 5.3 % (ref <5.7)
Mean Plasma Glucose: 105 mg/dL (ref <117)

## 2010-08-27 LAB — BASIC METABOLIC PANEL
BUN: 5 mg/dL — ABNORMAL LOW (ref 6–23)
CO2: 22 mEq/L (ref 19–32)
Calcium: 8.4 mg/dL (ref 8.4–10.5)
Chloride: 110 mEq/L (ref 96–112)
Creatinine, Ser: 0.49 mg/dL (ref 0.4–1.2)
GFR calc Af Amer: >60 mL/min (ref >60)
GFR calc non Af Amer: >60 mL/min (ref >60)
Glucose, Bld: 152 mg/dL — ABNORMAL HIGH (ref 70–99)
Potassium: 3.1 mEq/L — ABNORMAL LOW (ref 3.5–5.1)
Sodium: 140 mEq/L (ref 135–145)

## 2010-08-27 LAB — GLUCOSE, CAPILLARY
Glucose-Capillary: 115 mg/dL — ABNORMAL HIGH (ref 70–99)
Glucose-Capillary: 115 mg/dL — ABNORMAL HIGH (ref 70–99)
Glucose-Capillary: 127 mg/dL — ABNORMAL HIGH (ref 70–99)
Glucose-Capillary: 147 mg/dL — ABNORMAL HIGH (ref 70–99)
Glucose-Capillary: 165 mg/dL — ABNORMAL HIGH (ref 70–99)
Glucose-Capillary: 167 mg/dL — ABNORMAL HIGH (ref 70–99)

## 2010-08-27 LAB — MAGNESIUM: Magnesium: 1.5 mg/dL (ref 1.5–2.5)

## 2010-08-27 LAB — CBC
HCT: 31 % — ABNORMAL LOW (ref 36.0–46.0)
Hemoglobin: 9.9 g/dL — ABNORMAL LOW (ref 12.0–15.0)
MCH: 24 pg — ABNORMAL LOW (ref 26.0–34.0)
MCHC: 31.9 g/dL (ref 30.0–36.0)
MCV: 75.2 fL — ABNORMAL LOW (ref 78.0–100.0)
Platelets: 341 10*3/uL (ref 150–400)
RBC: 4.12 MIL/uL (ref 3.87–5.11)
RDW: 13.7 % (ref 11.5–15.5)
WBC: 12.6 10*3/uL — ABNORMAL HIGH (ref 4.0–10.5)

## 2010-08-28 LAB — CBC
HCT: 29 % — ABNORMAL LOW (ref 36.0–46.0)
Hemoglobin: 9.2 g/dL — ABNORMAL LOW (ref 12.0–15.0)
MCH: 24 pg — ABNORMAL LOW (ref 26.0–34.0)
MCHC: 31.7 g/dL (ref 30.0–36.0)
MCV: 75.5 fL — ABNORMAL LOW (ref 78.0–100.0)
Platelets: 354 10*3/uL (ref 150–400)
RBC: 3.84 MIL/uL — ABNORMAL LOW (ref 3.87–5.11)
RDW: 13.8 % (ref 11.5–15.5)
WBC: 9.2 10*3/uL (ref 4.0–10.5)

## 2010-08-28 LAB — GLUCOSE, CAPILLARY
Glucose-Capillary: 114 mg/dL — ABNORMAL HIGH (ref 70–99)
Glucose-Capillary: 127 mg/dL — ABNORMAL HIGH (ref 70–99)
Glucose-Capillary: 125 mg/dL — ABNORMAL HIGH (ref 70–99)
Glucose-Capillary: 131 mg/dL — ABNORMAL HIGH (ref 70–99)
Glucose-Capillary: 108 mg/dL — ABNORMAL HIGH (ref 70–99)

## 2010-08-28 LAB — BASIC METABOLIC PANEL
BUN: 2 mg/dL — ABNORMAL LOW (ref 6–23)
CO2: 23 mEq/L (ref 19–32)
Calcium: 8.7 mg/dL (ref 8.4–10.5)
Chloride: 108 mEq/L (ref 96–112)
Creatinine, Ser: 0.48 mg/dL (ref 0.4–1.2)
GFR calc Af Amer: >60 mL/min (ref >60)
GFR calc non Af Amer: >60 mL/min (ref >60)
Glucose, Bld: 118 mg/dL — ABNORMAL HIGH (ref 70–99)
Potassium: 3.5 mEq/L (ref 3.5–5.1)
Sodium: 142 mEq/L (ref 135–145)

## 2010-08-28 LAB — VANCOMYCIN, TROUGH: Vancomycin Tr: 5.7 ug/mL — ABNORMAL LOW (ref 10.0–20.0)

## 2010-08-28 MED ORDER — IOHEXOL 300 MG/ML  SOLN
80.0000 mL | Freq: Once | INTRAMUSCULAR | Status: AC | PRN
  Administered 2010-08-28: 80 mL via INTRAVENOUS

## 2010-08-29 ENCOUNTER — Inpatient Hospital Stay (HOSPITAL_COMMUNITY): Payer: Medicare Other

## 2010-08-29 LAB — CBC
HCT: 31 % — ABNORMAL LOW (ref 36.0–46.0)
Hemoglobin: 9.7 g/dL — ABNORMAL LOW (ref 12.0–15.0)
MCH: 23.4 pg — ABNORMAL LOW (ref 26.0–34.0)
MCHC: 31.3 g/dL (ref 30.0–36.0)
MCV: 74.9 fL — ABNORMAL LOW (ref 78.0–100.0)
Platelets: 386 10*3/uL (ref 150–400)
RBC: 4.14 MIL/uL (ref 3.87–5.11)
RDW: 13.9 % (ref 11.5–15.5)
WBC: 12.3 10*3/uL — ABNORMAL HIGH (ref 4.0–10.5)

## 2010-08-29 LAB — GLUCOSE, CAPILLARY
Glucose-Capillary: 104 mg/dL — ABNORMAL HIGH (ref 70–99)
Glucose-Capillary: 110 mg/dL — ABNORMAL HIGH (ref 70–99)
Glucose-Capillary: 110 mg/dL — ABNORMAL HIGH (ref 70–99)
Glucose-Capillary: 112 mg/dL — ABNORMAL HIGH (ref 70–99)
Glucose-Capillary: 117 mg/dL — ABNORMAL HIGH (ref 70–99)
Glucose-Capillary: 157 mg/dL — ABNORMAL HIGH (ref 70–99)
Glucose-Capillary: 84 mg/dL (ref 70–99)

## 2010-08-30 ENCOUNTER — Inpatient Hospital Stay (HOSPITAL_COMMUNITY): Payer: Medicare Other

## 2010-08-30 LAB — CBC
HCT: 29.5 % — ABNORMAL LOW (ref 36.0–46.0)
Hemoglobin: 9.5 g/dL — ABNORMAL LOW (ref 12.0–15.0)
MCH: 24.1 pg — ABNORMAL LOW (ref 26.0–34.0)
MCHC: 32.2 g/dL (ref 30.0–36.0)
MCV: 74.7 fL — ABNORMAL LOW (ref 78.0–100.0)
Platelets: 430 K/uL — ABNORMAL HIGH (ref 150–400)
RBC: 3.95 MIL/uL (ref 3.87–5.11)
RDW: 13.6 % (ref 11.5–15.5)
WBC: 12.9 K/uL — ABNORMAL HIGH (ref 4.0–10.5)

## 2010-08-30 LAB — GLUCOSE, CAPILLARY
Glucose-Capillary: 102 mg/dL — ABNORMAL HIGH (ref 70–99)
Glucose-Capillary: 117 mg/dL — ABNORMAL HIGH (ref 70–99)
Glucose-Capillary: 118 mg/dL — ABNORMAL HIGH (ref 70–99)
Glucose-Capillary: 129 mg/dL — ABNORMAL HIGH (ref 70–99)
Glucose-Capillary: 134 mg/dL — ABNORMAL HIGH (ref 70–99)
Glucose-Capillary: 144 mg/dL — ABNORMAL HIGH (ref 70–99)

## 2010-08-30 LAB — URINALYSIS, ROUTINE W REFLEX MICROSCOPIC
Bilirubin Urine: NEGATIVE
Glucose, UA: NEGATIVE mg/dL
Hgb urine dipstick: NEGATIVE
Ketones, ur: NEGATIVE mg/dL
Nitrite: NEGATIVE
Protein, ur: NEGATIVE mg/dL
Specific Gravity, Urine: 1.026 (ref 1.005–1.030)
Urobilinogen, UA: 0.2 mg/dL (ref 0.0–1.0)
pH: 8 (ref 5.0–8.0)
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
## 2010-08-31 LAB — BASIC METABOLIC PANEL
BUN: 6 mg/dL (ref 6–23)
CO2: 27 mEq/L (ref 19–32)
Calcium: 8.7 mg/dL (ref 8.4–10.5)
Chloride: 105 mEq/L (ref 96–112)
Creatinine, Ser: 0.42 mg/dL (ref 0.4–1.2)
GFR calc Af Amer: >60 mL/min (ref >60)
GFR calc non Af Amer: >60 mL/min (ref >60)
Glucose, Bld: 123 mg/dL — ABNORMAL HIGH (ref 70–99)
Potassium: 3.4 mEq/L — ABNORMAL LOW (ref 3.5–5.1)
Sodium: 138 mEq/L (ref 135–145)

## 2010-08-31 LAB — CBC
HCT: 32.5 % — ABNORMAL LOW (ref 36.0–46.0)
Hemoglobin: 10.4 g/dL — ABNORMAL LOW (ref 12.0–15.0)
MCH: 24.1 pg — ABNORMAL LOW (ref 26.0–34.0)
MCHC: 32 g/dL (ref 30.0–36.0)
MCV: 75.4 fL — ABNORMAL LOW (ref 78.0–100.0)
Platelets: 483 10*3/uL — ABNORMAL HIGH (ref 150–400)
RBC: 4.31 MIL/uL (ref 3.87–5.11)
RDW: 13.8 % (ref 11.5–15.5)
WBC: 10 10*3/uL (ref 4.0–10.5)

## 2010-08-31 LAB — GLUCOSE, CAPILLARY
Glucose-Capillary: 117 mg/dL — ABNORMAL HIGH (ref 70–99)
Glucose-Capillary: 142 mg/dL — ABNORMAL HIGH (ref 70–99)
Glucose-Capillary: 141 mg/dL — ABNORMAL HIGH (ref 70–99)
Glucose-Capillary: 118 mg/dL — ABNORMAL HIGH (ref 70–99)
Glucose-Capillary: 141 mg/dL — ABNORMAL HIGH (ref 70–99)
Glucose-Capillary: 95 mg/dL (ref 70–99)

## 2010-08-31 LAB — CULTURE, BLOOD (ROUTINE X 2)
Culture  Setup Time: 201204230900
Culture  Setup Time: 201204230900
Culture: NO GROWTH
Culture: NO GROWTH

## 2010-08-31 LAB — CLOSTRIDIUM DIFFICILE BY PCR: Toxigenic C. Difficile by PCR: NEGATIVE

## 2010-09-01 LAB — CBC
HCT: 30.9 % — ABNORMAL LOW (ref 36.0–46.0)
Hemoglobin: 9.6 g/dL — ABNORMAL LOW (ref 12.0–15.0)
MCH: 23.6 pg — ABNORMAL LOW (ref 26.0–34.0)
MCHC: 31.1 g/dL (ref 30.0–36.0)
MCV: 76.1 fL — ABNORMAL LOW (ref 78.0–100.0)
Platelets: 504 10*3/uL — ABNORMAL HIGH (ref 150–400)
RBC: 4.06 MIL/uL (ref 3.87–5.11)
RDW: 14.1 % (ref 11.5–15.5)
WBC: 8 10*3/uL (ref 4.0–10.5)

## 2010-09-01 LAB — URINE CULTURE
Colony Count: NO GROWTH
Culture  Setup Time: 201204291145
Culture: NO GROWTH

## 2010-09-01 LAB — GLUCOSE, CAPILLARY
Glucose-Capillary: 99 mg/dL (ref 70–99)
Glucose-Capillary: 120 mg/dL — ABNORMAL HIGH (ref 70–99)
Glucose-Capillary: 113 mg/dL — ABNORMAL HIGH (ref 70–99)

## 2010-09-05 ENCOUNTER — Encounter: Payer: Self-pay | Admitting: Internal Medicine

## 2010-09-07 IMAGING — RF DG ABDOMEN 1V
1 series · 1 of 1 positions shown · non-contrast
Comparison: None

CLINICAL DATA: History of partial gastric outlet obstruction.
History of PANDA placement.

ABDOMEN - 1 VIEW

[Series 1: run · 1 of 1 slices shown]
[im 1/1]
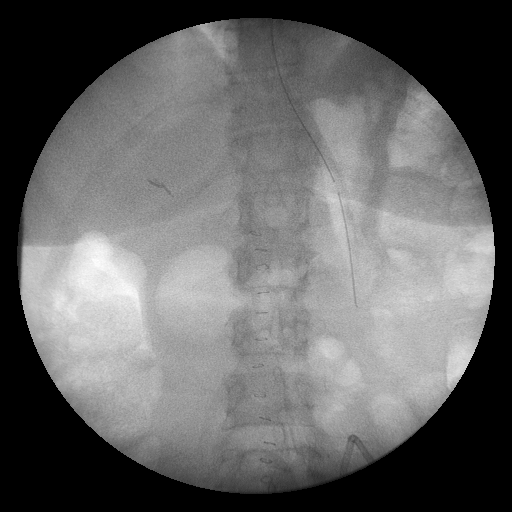

[1 of 1 positions shown; findings below may reference images not displayed]

FINDINGS: PANDA tube is in place with its tip in the body of the
stomach.
IMPRESSION: PANDA tube is now in place with tip in the body of the stomach.

## 2010-09-08 IMAGING — CR DG ABD PORTABLE 1V
1 series · 1 of 1 positions shown · non-contrast
Comparison: CT earlier today.

CLINICAL DATA: Status post gastrojejunostomy.

ABDOMEN - 1 VIEW

[AP]
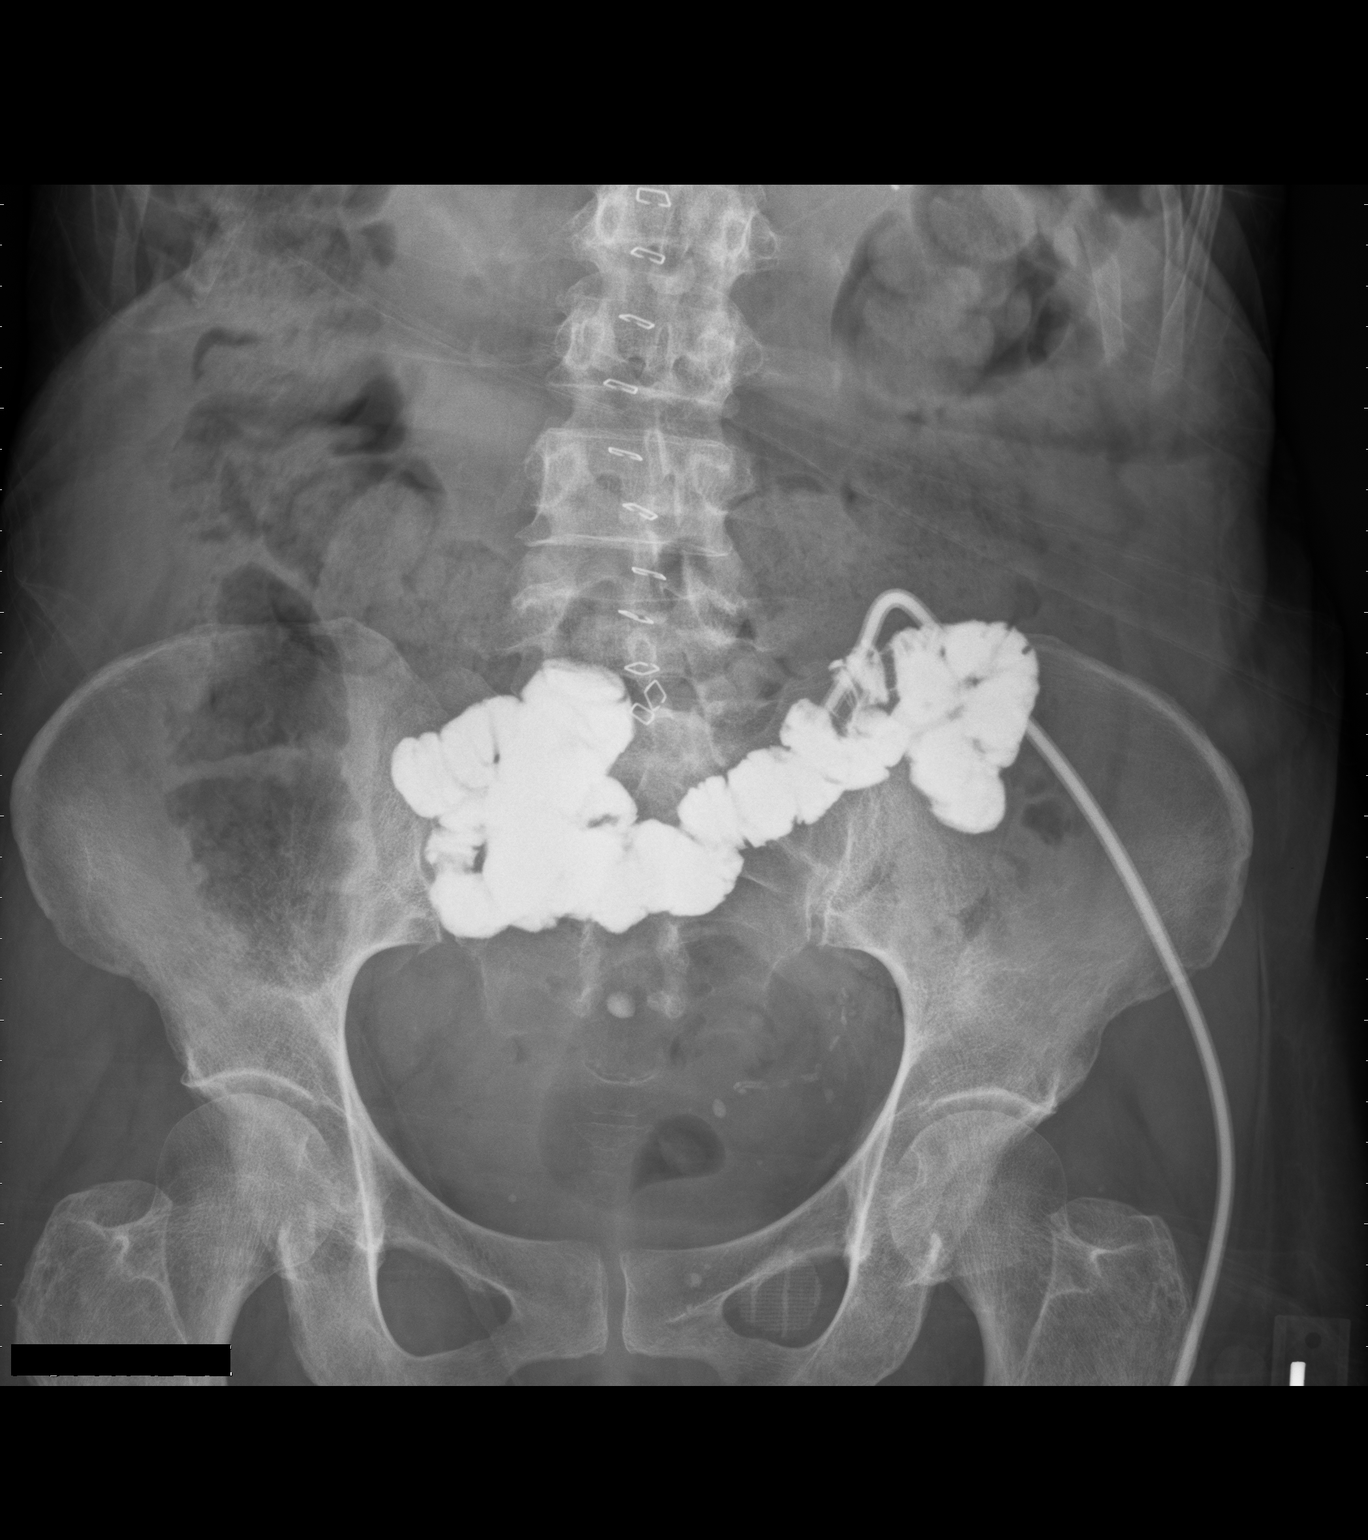

[1 of 1 positions shown; findings below may reference images not displayed]

FINDINGS: Single image of the abdomen was performed after injection
of 60 ml of contrast through the the patient's jejunostomy tube.
There is opacification of the small bowel loops in the lower
abdomen and upper pelvis.  No evidence of extraluminal contrast.
The opacified small bowel is normal caliber.  Large amount stool
noted throughout the colon.  Normal bowel gas pattern.
IMPRESSION: Contrast opacification of small bowel loops in the lower abdomen
and upper pelvis, which are normal caliber.  No evidence of
extraluminal contrast.

Large amount of stool in the colon.

## 2010-09-08 IMAGING — CT CT ABDOMEN W/O CM
2 of 7 series · 13 of 46 positions shown, 18 images · IV contrast (water/omni)
Comparison: CT abdomen pelvis 04/02/2005

CT ABDOMEN

CLINICAL DATA: Gastric outlet obstruction with recent gastric
jejunostomy.

CT ABDOMEN AND PELVIS WITHOUT CONTRAST
TECHNIQUE: Multidetector CT imaging of the abdomen and pelvis was
performed following the standard protocol without intravenous
contrast. Oral contrast was administered through the percutaneous
gastrostomy tube.  Contrast administered approximately 5  minutes
prior exam.

[Series 2: routine abdomen · axial · 0.70mm/px · z∈[-289,+46]mm · 10 of 79 slices shown, 15 images]
[im 6/79  soft-tissue]
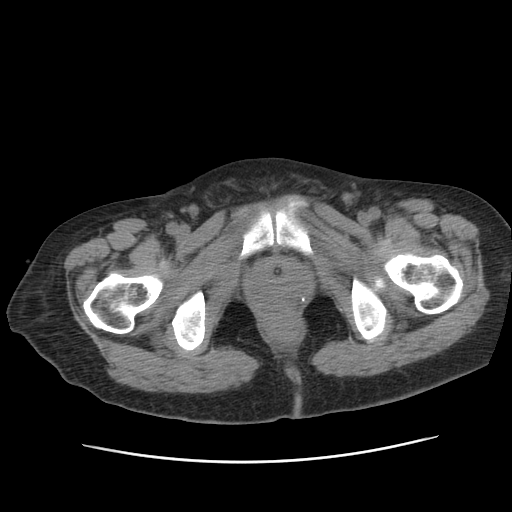
[im 6/79  bone]
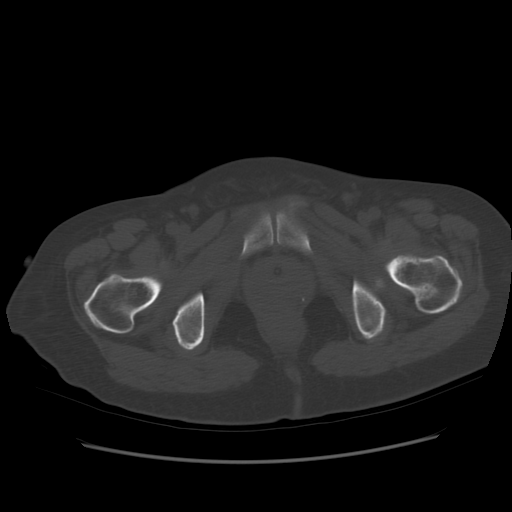
[im 16/79  soft-tissue]
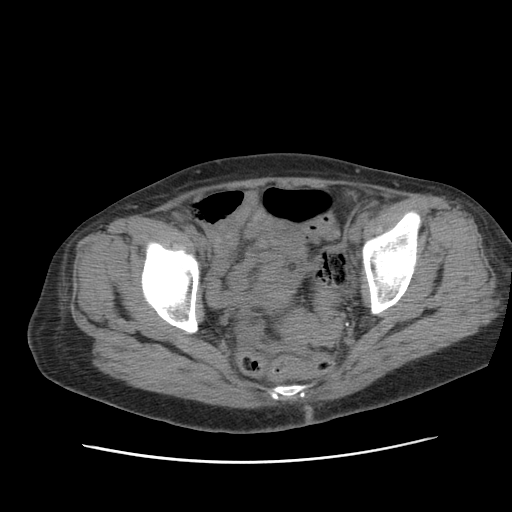
[im 21/79  soft-tissue]
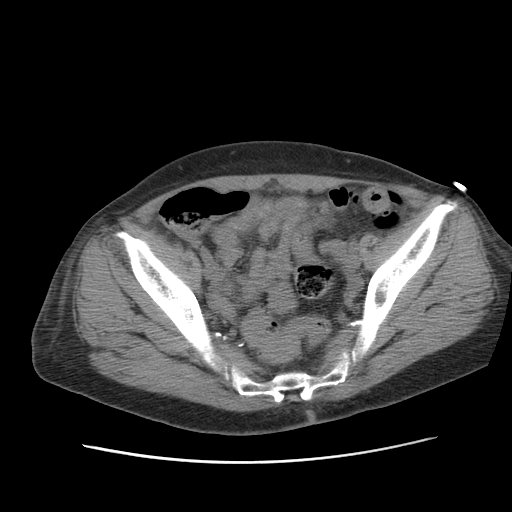
[im 32/79  soft-tissue]
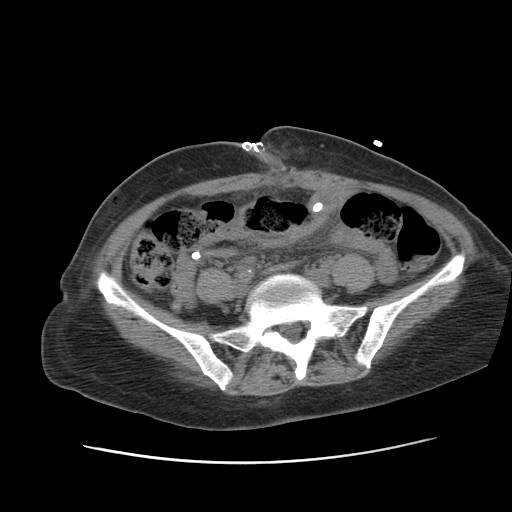
[im 42/79  soft-tissue]
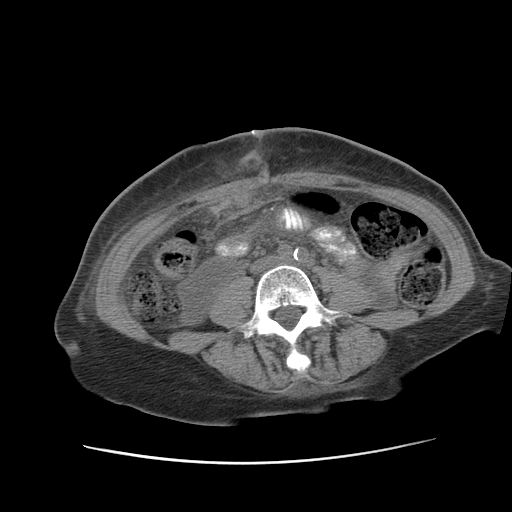
[im 47/79  soft-tissue]
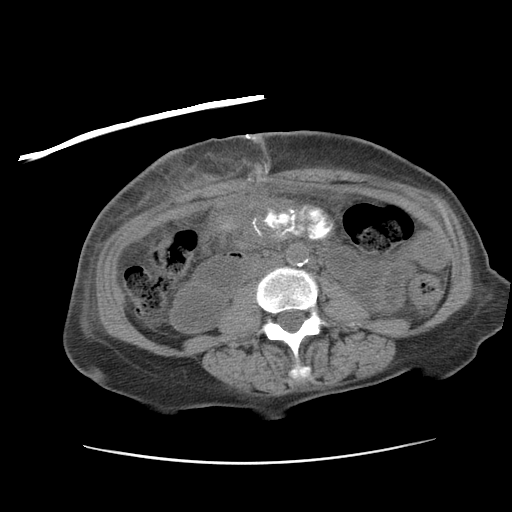
[im 58/79  soft-tissue]
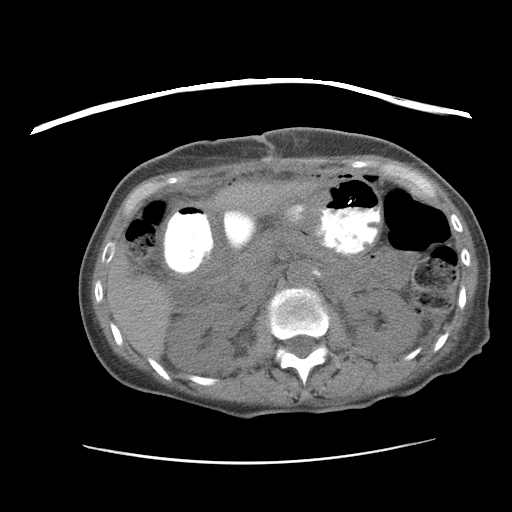
[im 58/79  lung]
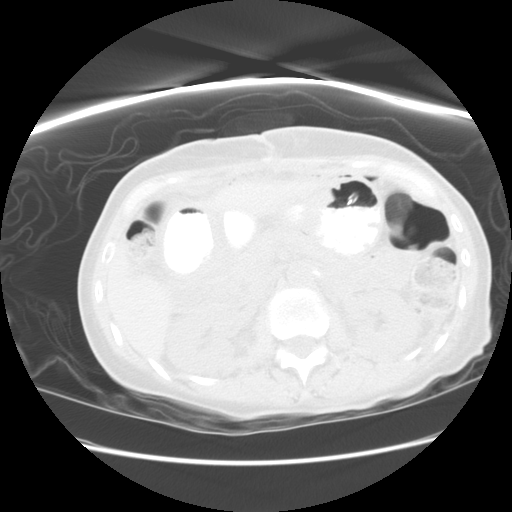
[im 63/79  soft-tissue]
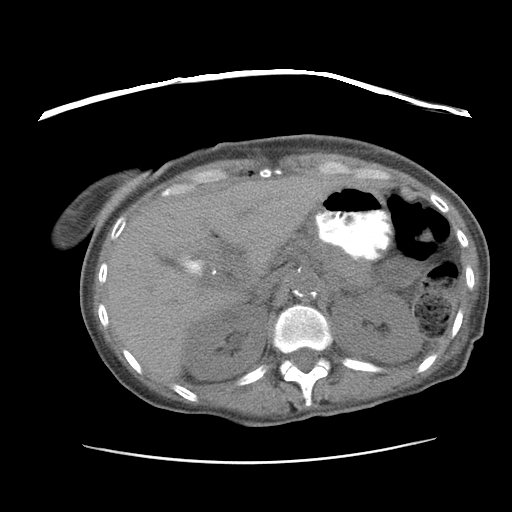
[im 63/79  lung]
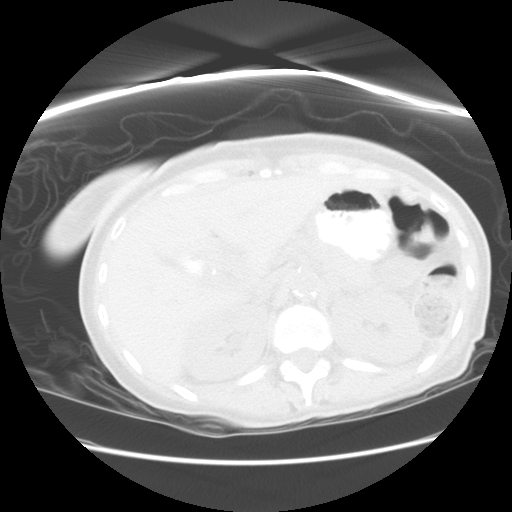
[im 68/79  lung]
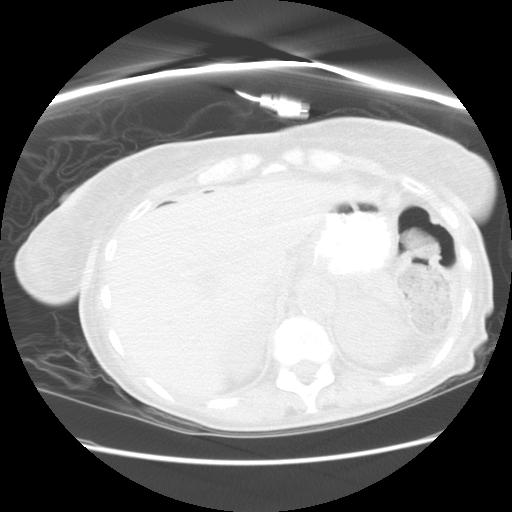
[im 73/79  soft-tissue]
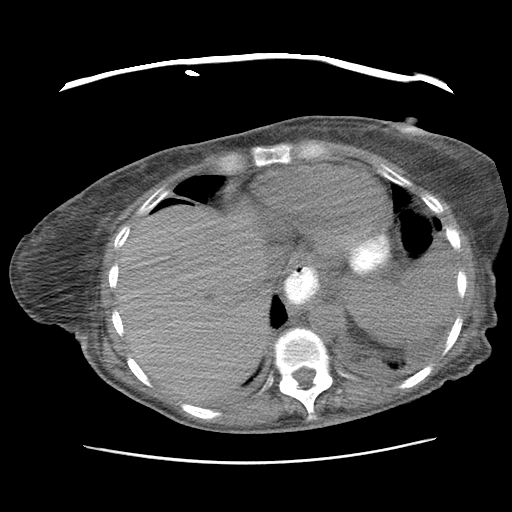
[im 73/79  lung]
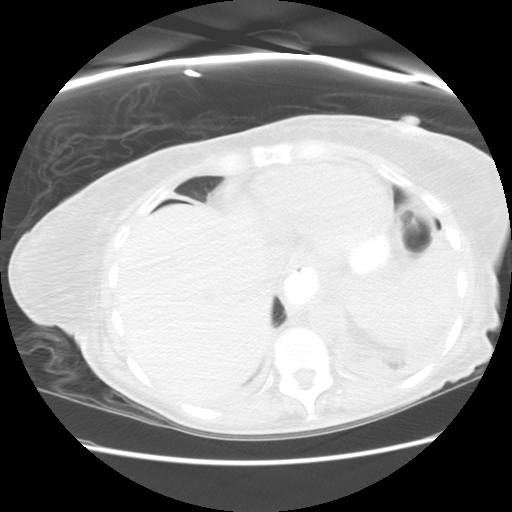
[im 73/79  bone]
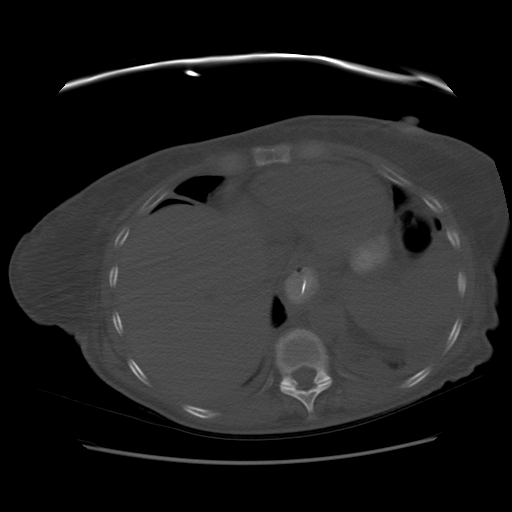

[Series 107: delayed cor · coronal · 0.70mm/px · 3 of 72 slices shown]
[im 18/72  soft-tissue]
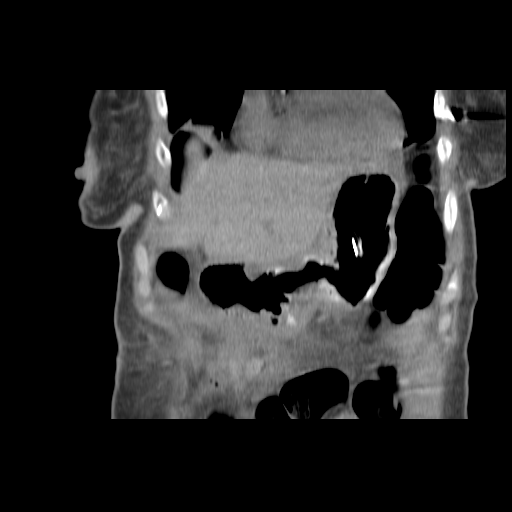
[im 36/72  soft-tissue]
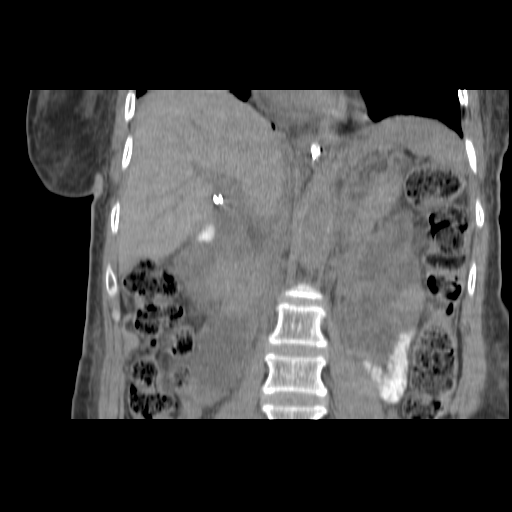
[im 54/72  soft-tissue]
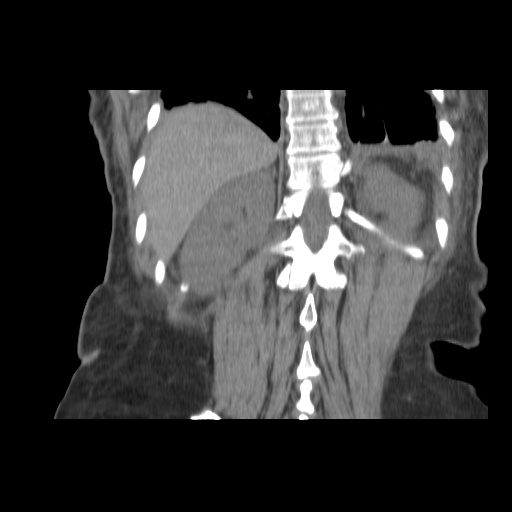

[13 of 46 positions shown; findings below may reference images not displayed]

FINDINGS: There is small bilateral pleural effusions.  Small
amount intraperitoneal free air related to recent surgery.  Midline
incision.  Left percutaneous jejunostomy tube.

Non-IV contrast images demonstrate no focal hepatic lesion.  There
is mild periportal edema. Cholecystectomy clips in the gallbladder
fossa.  The pancreas, spleen, adrenal glands, kidneys appear
normal.

There is an NG tube within the stomach.  Oral contrast fills the
stomach.  There is a gastrojejunostomy along the greater
curvature/antrum of the stomach.  Contrast flows through the
gastrojejunostomy without evidence of leak or obstruction.
Obstruction in the duodenum with no egress of contrast distal to
the first portion duodenum.  Significant reflux of contrast through
the GE junction.  No evidence of distal bowel obstruction.
Moderate stool throughout the colon.

Abdominal aorta is normal caliber.
IMPRESSION: 1.  No evidence of leak or obstruction at the gastrojejunostomy.
2.  Reflux through the gastroesophageal junction. The patient could
be at risk for aspiration.
3.  Obstruction of the proximal duodenum.
4.  Small amount of intraperitoneal free air related to recent
surgery.

CT PELVIS
FINDINGS: There is a Foley catheter within the bladder.  A
moderate volume of gas in the bladder presumably related to
catheterization.  No free fluid in the pelvis.  Rectum appears
normal.  There are diverticula of the sigmoid colon.  Uterus
appears normal. There is a lucency within the left posterior ileum
which measures 9 mm (image 52) which is stable compared to prior.
IMPRESSION: 1.  Gas in  the bladder presumably related to catheterization.
2. Diverticulosis without evidence of acute diverticulitis.

Findings discussed with Dr. Ferrara on 04/18/2009

## 2010-09-10 NOTE — Discharge Summary (Signed)
NAMEALVILDA, Emily [name]              ACCOUNT NO.:  [id number]  MEDICAL RECORD NO.:  [id number]           PATIENT TYPE:  I  LOCATION:  5525                         FACILITY:  [facility]  PHYSICIAN:  Zannie Cove, MD     DATE OF BIRTH:  12-Nov-1940  DATE OF ADMISSION:  08/24/2010 DATE OF DISCHARGE:  09/01/2010                              DISCHARGE SUMMARY   PRIMARY CARE PHYSICIAN:  Dr. Andrey Campanile at [facility].  GASTROENTEROLOGIST:  Hedwig Morton. Juanda Chance, MD.  GASTRIC SURGEON:  Dr. [name] at [facility].  DISCHARGE DIAGNOSES: 1. Healthcare-associated pneumonia. 2. 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 3. Diarrhea felt to be secondary to tube feeds. 4. History of recurrent hospitalizations secondary to nausea,     vomiting, and gastroparesis. 5. History of gastrectomy at [facility] in February 2012. 6. Prior history of gastrojejunostomy and G-tube placement in December     2010. 7. Hypertension. 8. Type 2 diabetes. 9. Gastroesophageal reflux disease. 10.Moderate protein-calorie malnutrition. 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  DISCHARGE MEDICATIONS: 1. Augmentin 875 mg p.o. b.i.d. for 2 days. 2. Tramadol 50 mg p.o. t.i.d. p.r.n. 3. Amitriptyline 25 mg per tube nightly p.r.n. 4. Hydrocodone/APAP elixir 15-20 mL per tube q.6 h. p.r.n. 5. Hyoscyamine 0.125 mg 1-2 tablets per tube q.4 h. p.r.n. 6. Lorazepam 0.5 mg per tube t.i.d. 7. Metoprolol tartrate 50 mg per tube b.i.d. 8. Reglan 5 mg/5 mL, 20 mL per tube q.i.d. 9. Zofran 4 mg per tube b.i.d. p.r.n. 10.Promethazine 12.5 mg suppository rectally q.4 h. p.r.n. 11.Carafate 1 g/10 mL suspension 2 teaspoons in tube q.i.d.  DIAGNOSTICS/INVESTIGATIONS:  Chest x-ray, left lower lobe pneumonia.  CT of the abdomen and pelvis on August 25, 2010, showed no significant acute bowel abnormalities since status post resection of the stomach with gastrojejunostomy, felt material in the jejunum like reflux since recently, just material left, upper  and lower lobe pneumonia noted, diffuse dilation of the distal esophagus, reflects esophageal dysmotility and reflux.  Repeat chest x-ray on August 30, 2010,  slight improvement in the left lower lobe/lingular pneumonia.  HOSPITAL COURSE:  [name] is a very pleasant elderly African [name] with a prolonged history of gastroparesis who has had a prior partial gastrectomy and G-tube and then recently finally required further total gastrectomy by Dr. [name] in [facility] in February 2012, presented to the hospital now with nausea, vomiting, and pleuritic chest pain.  On further evaluation, she was found to have left lower lobe and lingular pneumonia. 1. Felt to be healthcare-associated pneumonia since she had been in     the hospital setting within the last 90 days.  She was treated     with IV vancomycin and Zosyn.  Subsequently, cultures were negative     and transitioned to p.o. Augmentin.  She has been stable on this     and require 2 more days to complete a 10-day course of antibiotics.     She is clinically improved, is afebrile now with resolution of     leukocytosis as well as improvement in x-ray findings. 2. Acute on chronic gastroparesis.  This has been chronic longstanding     struggle for  this patient.  She did require bowel rest.  In     addition, continued on tube feeds at night in the hospital.  This     was treated supportively with Reglan, Zofran, and Phenergan.     Currently improved.  Tolerating 4-5 small meals a day, and she is     instructed to take her regular medicines along with 5-6 small meals     due to the fact that she has barely any stomach left now post gastrectomy. 3. Diarrhea felt to be induced by tube feeds.  This has been     improving.  Her leukocytosis has resolved.  C. diff PCR is negative     and her tube feeds will be switched back to Osmolite since she has     had trouble with Jevity in the past.  DISCHARGE CONDITION:   Stable.  DISCHARGE VITAL SIGNS:  Temperature is 98.4, pulse 84, blood pressure 131/81, respirations 18, sating 94% on room air.  DISCHARGE FOLLOWUP: 1. Dr. Andrey Campanile at [facility] in 1-2 weeks. 2. Dr. [name] at [facility], end of May 2012. 3. Dr. [name], the patient has appointment in mid June 2012.     Zannie Cove, MD     PJ/MEDQ  D:  09/01/2010  T:  09/02/2010  Job:  [id number]  cc:   Hedwig Morton. Juanda Chance, MD Dr. Andrey Campanile Dr. [name]  Electronically Signed by Zannie Cove  on 09/10/2010 12:31:13 PM

## 2010-09-11 NOTE — Telephone Encounter (Signed)
Continue Promethazine liquid , small feedings, I will await Dr Coral Else repport ----- Message ----- From: Daphine Deutscher, RN Sent: 08/21/2010 1:25 PM To: Hart Carwin, MD

## 2010-09-11 NOTE — Telephone Encounter (Signed)
Promethazine liquid, just refilled, can take prn, eat small meals, I will wait for dr Coral Else report ----- Message ----- From: Daphine Deutscher, RN Sent: 08/21/2010 1:25 PM To: Hart Carwin, MD

## 2010-09-15 ENCOUNTER — Other Ambulatory Visit: Payer: Self-pay | Admitting: Internal Medicine

## 2010-09-16 ENCOUNTER — Other Ambulatory Visit: Payer: Self-pay | Admitting: Internal Medicine

## 2010-09-16 MED ORDER — LANSOPRAZOLE 30 MG PO TBDP: ORAL_TABLET | ORAL | Status: DC

## 2010-09-16 MED ORDER — LANSOPRAZOLE 30 MG PO CPDR: 30.0000 mg | DELAYED_RELEASE_CAPSULE | Freq: Every day | ORAL | Status: DC

## 2010-09-16 NOTE — Telephone Encounter (Signed)
Message copied by Vernia Buff on Tue Sep 16, 2010  1:27 PM ------      Message from: [name]      Created: Tue Sep 16, 2010 12:33 PM      Regarding: prevacid solutabs vs protonix vs nothing       You are so smart! I prefer for her to be on Prevacid solutabs 15 or 30 mg, I think she still makes some acid  And solutabs may be absorbed better. Thanx      ----- Message -----         From: Vernia Buff, CMA         Sent: 09/16/2010  12:08 PM           To: Hart Carwin, MD            DB-      I got an authorization request for patient to get prevacid solutab. We were originally giving her Protonix and at her visit on 07/30/10 Gunnar Fusi was originally going to give her the prevacid because patient apparently hadnt been taking the Protonix. However, Gunnar Fusi then d/c'ed rx because "theoretically, patient shouldn't need PPI now." I think she had some entro-esophageal reflux on a barium swallow 07/30/10 though. So, do you want her on a PPI and if so, would you rather her be on Protonix or Prevacid Solutab?

## 2010-09-16 NOTE — Discharge Summary (Signed)
NAMEVICKEY, [name]              ACCOUNT NO.:  [id number]   MEDICAL RECORD NO.:  [id number]          PATIENT TYPE:  INP   LOCATION:  2031                         FACILITY:  [facility]   PHYSICIAN:  [name], M.D. DATE OF BIRTH:  12-20-1940   DATE OF ADMISSION:  02/17/2007  DATE OF DISCHARGE:                               DISCHARGE SUMMARY   FINAL DIAGNOSIS:  Recurrent hiatal hernia with reflux status post  laparoscopic Nissen repair and esophageal perforation.   IN-HOSPITAL DIAGNOSIS:  Postoperative acute blood-loss anemia.   SECONDARY DIAGNOSES:  1. Right pulmonary nodule, stable.  2. Diabetes mellitus type 2.  3. Hypertension.  4. Arthritis.  5. Anxiety disorder.  6. History of depression.   IN-HOSPITAL OPERATIONS AND PROCEDURES:  Left thoracotomy, redo repair of  hiatal hernia with Harvest Dark IV reconstruction.   HISTORY AND PHYSICAL AND HOSPITAL COURSE:  The patient is a 67-year  African [name] who comes with history of gastroesophageal  reflux disease, hiatal hernia status post Nissen approximately 10 years  ago.  She had a small periesophageal perforation requiring exploratory  laparotomy.  She did well overall and has developed two more episodes of  shortness of breath, reflux, substernal pain and pressure with  ambulation.  She was seen by Dr. Sherene Sires and PFTs were normal.  It was felt  the patient was dyspneic secondary to her hiatal hernia.  Barium study  showed a dysmotility hiatal hernia and sliding periesophageal component  with reflux.  The patient was placed on Nexium.  Manometry was done  showing normal esophageal manometry, normal lower esophageal sphincter,  with normal relaxation, normal peristalsis.  The patient was seen and  evaluated by Dr. Edwyna Shell.  Dr. Edwyna Shell discussed with the patient  undergoing redo hiatal hernia repair.  He discussed risks and benefits  of the procedure with the patient.  The patient acknowledged her  understanding and  agreed to proceed.  Surgery was scheduled for February 17, 2007.  For details of the patient's past medical history and  physical exam, please see dictated H&P.   The patient was taken to the operating room February 18, 2007, where she  underwent a left thoracotomy with redo repair of hiatal hernia with  Harvest Dark IV reconstruction.  The patient tolerated this procedure  well and was transferred to the intensive care unit in stable condition.  Postoperatively, the patient had an NG tube in.  She was placed on  n.p.o.  Daily chest x-rays obtained and were stable.  She had minimal  drainage from NG tube and this was able to be discontinued postoperative  day #2.  Following discontinuation of NG tube the patient was started on  sips of clear liquids and swallow study ordered for postoperative day  #3.  Swallow study done February 21, 2007.  She was noted to be stable  and diet was advanced as tolerated.  Prior to discharge home the patient  was tolerating regular diet, no nausea or vomiting.  She had been  started on Reglan p.o. b.i.d. postoperatively.  The patient did have  chest x-rays obtained daily.  No air leak was noted from chest tubes and  chest tube placed on water seal.  Anterior chest tube able to be  discontinued October 21.  Remaining chest tube discontinued the  following day.  Following removal of the left chest tube chest x-ray was  obtained and showed no pneumothorax.  Postoperatively, the patient did  develop acute blood-loss anemia.  She was started on p.o. iron and  monitored.  Prior to discharge home postoperative day #7, H&H was 10.3  and 33.1.  During the patient's postoperative course her vital signs  were followed.  Initially she did have low-grade temperature of 100.  The patient had slightly elevated white blood cell count.  No signs of  infection noted.  This was followed.  The patient's fever stabilized and  white blood cell count came back to within normal  limits.  The remainder  of the patient's vital signs remained stable.  She was able to be weaned  off oxygen saturating greater than 90% on room air.  Postoperatively,  the patient remained in normal sinus rhythm.  Pulmonary status remained  stable.  Her incisions were clean, dry and intact and healing well.  She  was out of bed ambulating well without difficulty.   Laboratories postoperative day #7, October 23, showed a white count of  11.9, hemoglobin of 10.3, hematocrit 33.1, platelets of 384.  Sodium of  134, potassium 3.6, chloride of 98, bicarb of 26, BUN of 5, creatinine  0.66, glucose of 95.   The patient is tentatively ready for discharge home in the a.m.  postoperative day #8, February 25, 2007.   FOLLOWUP APPOINTMENTS:  A followup appointment has been arranged with  Dr. Edwyna Shell for March 03, 2007, at 3 p.m.  The patient is to obtain a  PA and lateral chest x-ray 30 minutes prior to this appointment.   ACTIVITY:  The patient instructed no driving until released to do so, no  heavy lifting over 10 pounds.  She is told to ambulate three to four  times per day, progress as tolerated, and continue her breathing  exercises.   DIET:  The patient educated on diet to be low-fat, low-salt.   DISCHARGE MEDICATIONS:  1. Reglan 10 mg b.i.d.  2. Niferex 150 mg daily.  3. Percocet 5/325 one to two tablets q.4-6h. hours p.r.n. pain.  4. Nexium 40 mg daily.  5. Hydrochlorothiazide 25 mg daily.  6. Atenolol 25 mg daily.  7. Diovan 20 mg daily.  8. Metformin 500 mg daily.  The patient currently not on this; plan to      restart at discharge home as long as continues to tolerate a      regular diet.  9. Tylenol 650 mg one to two tablets q.4-6h. p.r.n. pain.      Theda Belfast, [place]      [name], M.D.  Electronically Signed    KMD/MEDQ  D:  02/24/2007  T:  02/25/2007  Job:  [id number]

## 2010-09-16 NOTE — Assessment & Plan Note (Signed)
Emily [name]                         GASTROENTEROLOGY OFFICE NOTE   KILYNN, Emily                       MRN:          [id number]  DATE:03/29/2007                            DOB:          07-Jan-1941    [name] is a 70 year old African-[name] with a history of  severe gastroesophageal reflux disease status post failed Nissen  fundoplication in 1998 status post Belsey IV procedure reconstruction  via left thoracotomy by Dr. Edwyna Shell in October, 2008.  She has undergone  esophageal dilatation approximately 3 weeks after the surgery for solid  food dysphagia.  The findings were minimal, only mild anastomotic  narrowing, but patient has been symptomatically relieved.  She has seen  Dr. Edwyna Shell since then who is satisfied with her post surgical progress.  She is now complaining of black stools intermittently, decreased level  of energy and some feelings of cold at night.  Her last hemoglobin was  10 with low MCV of 80.   PHYSICAL EXAM:  Blood pressure 138/62, pulse 72, weight 175 pounds.  She  was alert, oriented, in no distress.  Surgical incision was healing well  in left thoracotomy area.  LUNGS:  Clear to auscultation.  ABDOMEN:  Soft, nontender in the epigastrium.  There was, however,  tenderness in left lower and left middle quadrant.  There was no  palpable mass.  RECTAL EXAM:  With heme positive stool.   IMPRESSION:  12. A 70 year old African-[name] now 4 weeks post Belsey IV      procedure for failed Nissen fundoplication.  She is doing well from      that respect.  2. Heme positive stool.  Last colonoscopy 10 years ago.  We need to      rule out lower GI source of bleeding.  3. Iron deficiency anemia.   PLAN:  1. Begin iron supplements.  2. Schedule colonoscopy for further evaluation of her heme positive      stool.  3. Continue on the Nexium and metoclopramide for her gastroesophageal      reflux.     Hedwig Morton.  Juanda Chance, MD  Electronically Signed    DMB/MedQ  DD: 03/29/2007  DT: 03/29/2007  Job #: [id number]   cc:   [name], M.D.  Adolph Pollack, M.D.

## 2010-09-16 NOTE — Op Note (Signed)
Emily [name], Emily [name]              ACCOUNT NO.:  [id number]   MEDICAL RECORD NO.:  [id number]          PATIENT TYPE:  INP   LOCATION:  2302                         FACILITY:  [facility]   PHYSICIAN:  [name], M.D. DATE OF BIRTH:  03/25/1941   DATE OF PROCEDURE:  DATE OF DISCHARGE:                               OPERATIVE REPORT   PREOPERATIVE DIAGNOSIS:  Recurrent hiatal hernia with reflux status post  laparoscopic Nissen repair and esophageal perforation.   POSTOPERATIVE DIAGNOSIS:  Recurrent hiatal hernia with reflux status  post laparoscopic Nissen repair and esophageal perforation.   OPERATION PERFORMED:  Left thoracotomy.  Redo repair of hiatal hernia  with a Harvest Dark IV reconstruction.   SURGEON:  [name], M.D.   FIRST ASSISTANT:  Theda Belfast, PA   After percutaneous insertion of all monitoring lines, the patient was  turned in the left lateral thoracotomy position, was prepped and draped  in the usual sterile manner.  A dual lumen tube was inserted.  The left  lung was deflated.  This patient had a redo reflux and a hiatal hernia  and a paraesophageal hernia after having an esophageal laparoscopic  Nissen done several years previously and then had a postoperative leak.  I decided to do it from the chest because of the previous abdominal  surgery.  She was brought to the operating room and patient agreed to  the surgery.  She had some symptoms of severe reflux, shortness of  breath.  She is turned in the left lateral thoracotomy position as  mentioned.  A posterolateral thoracotomy incision was made over the  seventh intercostal space.  The latissimus was divided.  The serratus  was split and the fifth and seventh intercostal space was entered.  The  inferior pulmonary ligament was taken down.  There was a lot of  adhesions of the left lower lobe and the lingula to the diaphragm.  This  all had to be taken down and reflected superiorly.  Then the  esophagus  was identified and dissected out, taking care to preserve the vagus  nerves and looping the esophagus with the Penrose drain.  Then we  dissected superiorly up to the aortic arch.  At one point, it appeared  that there was a small opening in the right pleura and this was sutured  with 3-0 Vicryl.  After we dissected up to the aortic arch, then  dissection was started inferiorly where the return hiatal hernia was and  there was a marked amount of inflammation there.  The hernia sac was  entered and we carefully dissected the hernia sac medially and  laterally, dissecting up the hernia and the Nissen fundoplication off of  the surrounding tissue off the left crura and then off the right crura  and superiorly off the diaphragm.  The left crura was identified and  tagged with a 0 Prolene and then the right crura was identified and  tagged with a 0 Prolene.  After this had been done, we dissected the sac  off as much as possible and then passed an NG tube into  the stomach.  There was one area of where the stomach was inadvertently entered and  this was repaired with a 2-0 silk suture.  We then passed a 45 bougie  into the stomach without difficulty and freed up the anterior portion of  stomach to do a Belsey repair.  The stitches were then placed from the  esophagus going from 240 to 250 degrees around repair.  Patient was  stitched through the esophagus, then through 1 cm down the stomach below  the GE junction to give a 240 degree wrap around.  This was done with  three 2-0 silk stitches and the stitches were then tied around and tied  down.  We then placed a second row of stitches between the esophagus to  1 cm below the previous area on the stomach and then into the diaphragm  and these were all tagged.  The crura was then closed with #1 Polydek,  placing three sutures through the medial and lateral crura but not tying  them down.  The bougie was then removed.  We filled the  stomach with air  under water to check for any leak and none was seen.  We then tied down  two of the crural sutures without difficulty and then reduced the hiatal  hernia back below the diaphragm and tied down the second row of Belsey  sutures so that the hernia was tied to the diaphragm.  We went to tie  the third crural suture and it had obviously been too tight so we placed  a third one slightly inferior to the previous one and tied it down so  that it would easily admit the tip of a finger for about 1 cm opening.  After this had been done, we then sutured two or three more stitches  between the diaphragm to the esophagus, irrigated it copiously, got the  NG in place, put a right angle chest tube and a straight 36 chest tube  to right angle right down near the repair and a straight 36 chest tube  anteriorly and sutured in place with 0 silk.  A Marcaine block was done  in the usual fashion.  A single On-Q was placed in the usual fashion and  the chest was then closed with #1 Vicryl, pericostals drilling through  this eighth rib and passed around the fifth rib, #1 Vicryl in the muscle  layer, 2-0 Vicryl in subcutaneous tissue and Dermabond for the skin.  The patient was returned to the recovery room in stable condition.      [name], M.D.  Electronically Signed     DPB/MEDQ  D:  02/17/2007  T:  02/17/2007  Job:  [id number]   cc:   [name], M.D.

## 2010-09-16 NOTE — Assessment & Plan Note (Signed)
OFFICE VISIT   Emily [name], Emily [name]  DOB:  13-Nov-1940                                        October 31, 2008  CHART #:  [id number]   The patient came for followup today.  She has had recent shoulder  surgery.  Her blood pressure was 111/70, pulse 80, respirations 16, and  saturations were 94%.  She is still being followed by Dr. Knute Neu and he  states he does not think she has gastroparesis, but she still has some  trouble with her appetite and as far as gastric emptying.  Overall, she  is stable.  She still has some moderate left thoracotomy pain.  I told  her that this would remain with her.  I will have her see Dr. Juanda Chance and  her medical doctor.  We will see her again if she has any future  problems.   [name], M.D.  Electronically Signed   DPB/MEDQ  D:  10/31/2008  T:  11/01/2008  Job:  [id number]

## 2010-09-16 NOTE — H&P (Signed)
Emily [name], Emily [name]              ACCOUNT NO.:  [id number]   MEDICAL RECORD NO.:  [id number]           PATIENT TYPE:   LOCATION:                                 FACILITY:   PHYSICIAN:  [name], M.D. DATE OF BIRTH:  1940/05/05   DATE OF ADMISSION:  DATE OF DISCHARGE:                              HISTORY & PHYSICAL   HISTORY OF PRESENT ILLNESS:  A 70 year old, African-American woman,  comes with a history of GERD, hiatal hernia and had a Nissen  fundoplication approximately 10 years ago. She had a small peri-  esophageal perforation of thr esophagus requiring an exploratory  laparotomy.  She did well overall and then has developed 2 more episodes  of shortness of breath, reflux, substernal pain and pressure with  ambulation.  She was seen by Dr. Sherene Sires and pulmonary function tests were  normal and was felt to be dyspneic secondary to her hiatal hernia.  Barium study showed a  some dysmotility, hiatal hernia, and sliding  paraesophageal component with reflux.  She was placed on Nexium and  manometry that showed a normal esophageal manometry and normal lower  esophageal sphincter with normal relaxation and normal peristalsis.  She  is awaiting hiatal hernia repair.   PAST MEDICAL HISTORY:  1. Right pulmonary nodule, stable.  2. Diabetes mellitus, type 2.  3. Hypertension.  4. Arthritis.  5. Anxiety disorder.  6. Negative stress test.  7. Depression.  8. History of anemia.   ALLERGIES:  SHE HAS NO DRUG ALLERGIES.   MEDICATIONS:  Include:  1. Hydrochlorothiazide 25 mg daily.  2. Metformin 500 mg 1 per day.  3. Atenolol 25 mg a day.  4. Nexium 40 mg twice a day.  5. Diovan 20 mg daily.  6. Reglan 20 mg four times a day.   FAMILY HISTORY:  Positive for diabetes, heart disease, cancer.   SOCIAL HISTORY:  She does not drink or smoke.  She is single and has a  daughter.   REVIEW OF SYSTEMS:  She is awake, weight is 186 pounds, 5 feet 4 inches.  CARDIAC:  She has  shortness of breath on exertion.  She has some chest  tightness with ambulation.  No atrial fibrillation.  PULMONARY:  No  hemoptysis, asthma, wheezing.  GI:  History of peptic ulcer disease,  abdominal pain, constipation.  GU:  No dysuria, kidney disease.  VASCULAR:  No claudication, DVT or TIA.  NEUROLOGIC:  She has headaches.  No blackouts or seizures.  MUSCULOSKELETAL:  She has arthritis, joint  pain and muscular pain.  PSYCHIATRIC:  Been treated for depression.  ENT:  She has had recently decreased hearing.  HEMATOLOGIC:  No problems  with clotting but does have anemia.   PHYSICAL EXAMINATION:  She is a rather obese, African-[name] in  no acute distress.  Her vital signs are:  Blood pressure 152/91, pulse  84, respirations 18, O2 saturation 96%.  HEAD:  Atraumatic.  EYES:  Pupils equal and reactive to light and accommodation.  Extraocular movements are normal.  EARS:  Tympanic membranes are intact.  NOSE:  There is  no septal deviation.  MOUTH:  Throat is without lesions.  NECK:  Supple, without thyromegaly, JVD, or carotid bruits.  No  supraclavicular or axillary adenopathy.  CHEST:  Clear to auscultation and percussion.  HEART:  Regular sinus.  No murmurs.  ABDOMEN:  Soft, abdominal scar, no hepatosplenomegaly.  Bowel sounds are  normal.  EXTREMITIES:  Pulses are 2+.  There is no clubbing or edema.  NEUROLOGIC:  She is oriented x3.  Sensory and motor intact.  Cranial  nerves intact.  SKIN:  Without lesion.   IMPRESSION:  1. Hiatal hernia with gastroesophageal reflux disease and reflux.  2. Diabetes, type 2.  3. Hypertension.  4. Arthritis.  5. Anxiety disorder and depression.  6. History of iron deficiency anemia.   PLAN:  Is to redo hiatal hernia repair to the left chest..      [name], M.D.  Electronically Signed     DPB/MEDQ  D:  02/15/2007  T:  02/16/2007  Job:  [id number]

## 2010-09-16 NOTE — Letter (Signed)
April 06, 2007   Emily [name]. Juanda Chance, MD  [address]  [place], [place] [address]   Re:  Emily [name], Emily [name]                DOB:  12-13-1940   Dear Dr. Juanda Chance:   She returns today. She is still having some mild post-thoracotomy pain,  but it is improving. She still has to swallow slowly, but she is able to  swallow much better. Her weight is stable. Her blood pressure is 152/87,  pulse 100, respirations 18, saturations were 97%. Lungs were healing  well. Overall, she is doing satisfactory since her surgery. I plan to  see her back in six weeks with a chest x-ray and may repeat her barium  swallow at that time.   [name], M.D.  Electronically Signed   DPB/MEDQ  D:  04/06/2007  T:  04/07/2007  Job:  [id number]   cc:   Adolph Pollack, M.D.

## 2010-09-16 NOTE — Op Note (Signed)
NAMEETOLA, MULL              ACCOUNT NO.:  [id number]   MEDICAL RECORD NO.:  [id number]          PATIENT TYPE:  AMB   LOCATION:  DSC                          FACILITY:  [facility]   PHYSICIAN:  Rodney A. Mortenson, M.D.DATE OF BIRTH:  1941-02-28   DATE OF PROCEDURE:  09/27/2008  DATE OF DISCHARGE:                               OPERATIVE REPORT   JUSTIFICATION:  A 70 year old female with a 1-year history of pain about  her right shoulder.  No injury.  She is having significant pain at  night.  Abduction is very painful.  No tenderness over the [facility] joint.  Empty can test was positive.  Impingement testing is very painful.  She  has good range of motion about the shoulder.  An MRI was done and this  shows a full-thickness retracted tear of the supraspinatus.  There was  some AC joint degenerative changes and some slight downsloping of the  acromion.  No tenderness over the [facility] joint.  Because of persistent pain  and discomfort, it was felt that surgical intervention was indicated  necessary.  Questions were answered and encouraged preoperatively.  Complication discussed.   JUSTIFICATION FOR OUTPATIENT SURGERY:  Minimum morbidity.   PREOPERATIVE DIAGNOSIS:  Full-thickness rotator cuff tear right  shoulder.   POSTOPERATIVE DIAGNOSIS:  Full-thickness rotator cuff tear right  shoulder; extensive tearing anterior labrum; fraying and tearing intra-  articular portion of biceps right shoulder.   OPERATION:  Right shoulder arthroscopy; debridement of torn labrum;  debridement of intra-articular torn portion of biceps; open  acromioplasty and open tear repair rotator cuff using a PEEK screw  suture closure.   SURGEON:  Lenard Galloway. Chaney Malling, MD   ASSISTANT:  Oris Drone. Petrarca, PA-C   ANESTHESIA:  General.   PROCEDURE:  The patient was placed on the operating table in supine  position.  After satisfactory general anesthesia, the patient was placed  in a semi-sitting position.  The upper  extremity shoulder was then  prepped with DuraPrep and draped down in the usual manner.  Through  posterior operative portal, the arthroscope was introduced and an  anterior operative portal was introduced.  Excellent visualization of  the shoulder was obtained.  There was marked fraying of the entire  anterior labrum.  Normal articular cartilage over the humeral head and  the glenoid.  Portions of the biceps tendons were markedly frayed and  represented about 30-40% in diameter of the biceps.  It was just  proximal to its outlet in the bicipital groove.  The biceps anchor  appeared normal.  There is a very large retracted rotator cuff tear and  from the glenohumeral joint one could visualize the subacromial space.  Through an anterior portal, the intra-articular shaver was introduced  and the torn labrum was debrided and portions of the torn biceps were  also debrided.  This was then switched with an ArthroCare wand and the  ArthroCare wand was then used to ablate portions and smooth up the torn  rotator cuff area and the biceps was very gently touched with the  ArthroCare wand and this coalesced frayed and torn  portions of the  biceps very nicely.  A fair amount of time was spent debriding this  area.  Once this was accomplished to my satisfaction, the arthroscope  and the other instruments in the glenohumeral joint were removed.   Saber-cut incision was made over the anterolateral aspect of the  shoulder.  Bleeders were coagulated.  Deltoid splitting procedure was  then done and self-retaining retractor was put in place.  The  subacromial bursa was removed.  A saw was then used, acromioplasty was  done and there was excellent visualization.  The footprint could be seen  and supraspinatus was torn completely off the footprint.  A rongeur was  then used to freshen up the footprints and there was bleeding bone.  The  two leaves of the rotator cuff could be pulled down into an anatomic   position.  A Biomet PEEK anchor was placed in the humeral area  just  distal to the articular surface at right angles to the long axis.  Sutures were then brought through the anterior-posterior sleeves of the  retracted tear and traction was put in and this was brought down to  anatomic position.  Sutures were passed back and forth across the tear  and a complete anatomic repair was achieved.  It was a Therapist, occupational.  Once this was finished, shoulder was put through a full range  of motion and the rotator cuff was reattached in the footprint area and  there was excellent excursion with no impingement as her shoulders were  abducted, internally and externally rotated.  Saline was used to  irrigate the shoulder.  The deltoid was closed side-to-side with 0  Vicryl, 2-0 Vicryl was used to close the subcutaneous tissue, stainless  steel staple was used to close the skin.  Sterile dressing was applied.  The patient returned to recovery room in excellent condition.  The  patient did have a shoulder block.   DISPOSITION:  1. To my office on Wednesday.  2. Wear a sling at night and take it off during the day.  3. Usual postoperative instructions.      Rodney A. Chaney Malling, M.D.  Electronically Signed     RAM/MEDQ  D:  09/27/2008  T:  09/28/2008  Job:  [id number]

## 2010-09-16 NOTE — Letter (Signed)
March 03, 2007   Hedwig Morton. Juanda Chance, MD  [address]  [place] [address]   Re:  HETHER, ANSELMO                DOB:  [birth year]   Dear Verlee Monte:   The patient comes approximately two weeks into her hiatal hernia repair  with a Belsey.  Her incisions are well-healed and we removed her chest  tube sutures.  The only problem is she still has moderate dysphagia.  She says she has to eat real slowly and has trouble as far as the food  going down.  We did a postoperative swallow which showed there was some  narrowing at the area where the wrap was, but the contrast went into the  stomach without difficulty.  She is on Reglan 10 mg four times a day.  I  would appreciate your evaluation of her, as we may have had too tight a  wrap and may require an early dilatation.  I will see her back again in  two weeks with a chest x-ray.   [name], M.D.  Electronically Signed   DPB/MEDQ  D:  03/03/2007  T:  03/04/2007  Job:  [id number]   cc:   Adolph Pollack, M.D.

## 2010-09-16 NOTE — H&P (Signed)
Emily [name], Emily [name]              ACCOUNT NO.:  [id number]   MEDICAL RECORD NO.:  [id number]          PATIENT TYPE:  INP   LOCATION:  3012                         FACILITY:  [facility]   PHYSICIAN:  Emily Dare. Janee [name], M.D.DATE OF BIRTH:  1940/08/11   DATE OF ADMISSION:  05/08/2007  DATE OF DISCHARGE:                              HISTORY & PHYSICAL   CHIEF COMPLAINT:  Epigastric and right upper quadrant pain.   HISTORY OF PRESENT ILLNESS:  Emily [name] is a pleasant 70 year old  African [name] with the onset of epigastric and right upper  quadrant pain approximately 2 days ago.  This was sudden in nature and  quite severe.  It did pass after a period of time; however, it did recur  within 24 hours and woke her from sleep.  The pain was not relieved with  some old Vicodin she had around the house as well as some ibuprofen so  she came to the [facility] emergency department for further evaluation.  Workup here demonstrates mild pancreatitis, mild elevation of  transaminases and an ultrasound showing gallstones and gallbladder wall  thickening.  We were asked to evaluate for admission and further  treatment.   PAST MEDICAL HISTORY:  1. Diabetes mellitus.  2. Hypertension.  3. Irritable bowel syndrome.  4. GERD.  5. Hiatal hernia.   PAST SURGICAL HISTORY:  Hiatal hernia repair 10 years ago.  She then had  a redo hiatal hernia repair by Dr. Dewayne [name] in October 2008.   DRUG ALLERGIES:  None.   SOCIAL HISTORY:  She does not smoke or drink alcohol.  She is retired.   CURRENT MEDICATIONS:  Atenolol, hydrochlorothiazide, Nexium, Diovan and  metformin.  She is uncertain of her doses.   REVIEW OF SYSTEMS:  GI:  Positive as above.  CARDIAC:  Negative.  PULMONARY:  Negative.  GU:  Negative currently.  The remainder of the  review of systems was unremarkable.   Her primary physician is [facility].   PHYSICAL EXAM:  Temperature 98.2, pulse 83, respirations 15, blood  pressure  157/80.  GENERAL:  She is awake and alert.  HEENT:  She wears glasses.  Pupils are equal and reactive.  Sclerae are  clear.  Oral mucosa is moist.  NECK:  Supple with no masses or tenderness.  LUNGS:  Clear to auscultation with good respiratory excursion.  CARDIOVASCULAR:  Heart is regular.  No murmur is heard.  Impulse is  palpable in the left chest.  Distal pulses are 1-2+ with no peripheral  edema.  ABDOMEN:  Mild epigastric and bilateral upper quadrant tenderness with  no guarding.  No masses are noted.  Bowel sounds are hypoactive.  SKIN:  Warm and dry.  No rashes are present.  NEUROLOGIC:  She is alert and oriented.  She moves all extremities well.  No focal deficit is noted.   LABORATORY STUDIES:  An ultrasound of the abdomen shows gallstones with  gallbladder wall thickening.  White blood cell count is 9.4, hemoglobin  12.3.  Lipase is 396.  AST 61, ALT 64.  Basic metabolic panel is within  normal  limits.   IMPRESSION:  1. Biliary pancreatitis.  2. Cholecystitis.  3. Hypertension.  4. Diabetes mellitus.   PLAN:  Admit the patient and we will place her on IV fluids and IV  antibiotics with bowel rest.  I have requested GI evaluation from Dr.  Russella [name].  He is covering for Dr. Juanda [name].  The patient may also need a  cholecystectomy this admission.  We will make further plans after  checking some follow-up laboratory studies later today.  The plan was  discussed in detail with the patient.  I also spoke with her daughter on  the phone.  Questions were answered.      Emily [name], M.D.  Electronically Signed     BET/MEDQ  D:  05/09/2007  T:  05/09/2007  Job:  [id number]   cc:   Emily [name]. Emily Chance, MD

## 2010-09-16 NOTE — Group Therapy Note (Signed)
NAMETENEKA, MALMBERG NO.:  [id number]   MEDICAL RECORD NO.:  [id number]          PATIENT TYPE:  WOC   LOCATION:  [facility]                   FACILITY:  [facility]   PHYSICIAN:  Argentina Donovan, MD        DATE OF BIRTH:  02/03/1941   DATE OF SERVICE:                                  CLINIC NOTE   CHIEF COMPLAINT:  Pelvic and hip pain.   HISTORY OF PRESENT ILLNESS:  This is a 70 year old female referred from  [facility] for annual exam, Pap smear, and evaluation of pelvic and hip  pain.  The patient notes that 10 years ago she had onset of bilateral  leg and hip and pelvic pain that resolved spontaneously.  However, 2-1/2  years ago she started having intermittent pain in various locations.  At  first she described bilateral inguinal pain which is not present today,  however.  She also notes intermittent right and left anterior thigh  pain, neither of which happens at the same time, but which happens  intermittently.  She also notes left hip pain.  This pain is worse when  rising from a chair.  She is postmenopausal, does not have any vaginal  bleeding, and does have menopausal symptoms which include hot flashes  and night sweats.   PAST MEDICAL HISTORY:  Significant for a large weight loss over the past  year she estimates to be approximately 50 pounds ever since she had  hiatal hernia repair at [facility] on February 17, 2007.  She  has been evaluated for this leg pain and has variously been diagnosed  with fibromyalgia versus osteoarthritis, though she does not carry a  diagnosis of either of them at this time.  1. Gastric ulcers.  2. Arthritis.  3. Hypertension.  4. Diabetes mellitus type 2.   PAST OB HISTORY:  She is a G1, P1-0-0-1 with no history of Caesarean  section.   GYNECOLOGICAL HISTORY:  Her last Pap smear was 3 years ago, and she has  never had an abnormal Pap smear.  Her last mammogram was in July of 2009  and was normal.   PAST SURGICAL  HISTORY:  As noted above.  Hiatal hernia repair in October  of 2008.   FAMILY HISTORY:  Positive for diabetes in her mother, heart disease in  her mother and father, including heart attacks in both, high blood  pressure in her mother and father, and cancer in her brother.   SOCIAL HISTORY:  She lives alone, does not smoke, drink alcohol, or use  illicit drugs.   REVIEW OF SYSTEMS:  Positive for muscle aches, night sweats, fatigue,  weight loss, headaches, dizziness, hearing trouble, and hot flashes.   ALLERGIES:  No known drug allergies.  Not allergic to latex.   MEDICATIONS:  Nexium b.i.d., hydrochlorothiazide daily, Diovan daily,  metformin daily.  Dosages of these medications are not available.   PHYSICAL EXAMINATION:  VITAL SIGNS:  Temperature 98.3, pulse 116, blood  pressure 143/87, weight 139.2 pounds, height 64 inches.  GENERAL:  She is alert and oriented in no acute distress.  PELVIC EXAM:  Pelvic exam performed, no discharge or bleeding is noted.  Cervical os is without discharge or bleeding.  Vaginal mucosa has  decreased rugae.  Pap smear is obtained today.  Upon bimanual exam the  patient has a fundal tenderness, and the fundus of the uterus is just  below the pubic symphysis.  ABDOMINAL EXAM:  Normoactive bowel sounds with no masses palpated.  EXTREMITY EXAM:  Sensation is intact in the bilateral lower extremities.  Gait is normal.  She has a positive straight leg raise on the left,  negative on the right.  Focal tenderness over the last greater  trochanter, 2+ deep tendon reflexes in the patellar tendon.   ASSESSMENT/PLAN:  This is a 70 year old female evaluated for pelvic and  hip pain.  1. Pelvic pain.  Given the tenderness on exam will check a      transvaginal ultrasound and have the patient come back in 2 weeks      for review of the results.  At that time would also recommend      repeat pelvic exam to distinguish between levator versus bladder       involvement if possible.  Will also obtain urinalysis and urine      cultures today.  2. Hip pain.  I believe this is a separate issue from the pelvic pain      and have referred the patient to the [facility] for further evaluation.  Her physical exam      is consistent with a trochanteric bursitis, but will defer to      sports medicine physicians for further analysis of this problem.     ______________________________  Romero Belling, MD    ______________________________  Argentina Donovan, MD    MO/MEDQ  D:  12/28/2007  T:  12/28/2007  Job:  [id number]

## 2010-09-16 NOTE — Cardiovascular Report (Signed)
Emily [name], Emily [name]              ACCOUNT NO.:  [id number]   MEDICAL RECORD NO.:  [id number]           PATIENT TYPE:   LOCATION:                                 FACILITY:   PHYSICIAN:  Bevelyn Buckles. Bensimhon, MDDATE OF BIRTH:  [birth year]   DATE OF PROCEDURE:  04/20/2008  DATE OF DISCHARGE:                            CARDIAC CATHETERIZATION   GASTROINTESTINAL PHYSICIAN:  Hedwig Morton. Juanda Chance, MD   THORACIC SURGEON:  [name], MD   REFERRING PHYSICIAN:  Jesse Sans. Daleen Squibb, MD, [facility]   Emily [name] is a very pleasant 70 year old woman with a history of chest  pain.  This was previously found to be due to hiatal hernia which has  been repaired twice.  She did have a cardiac catheterization several  years ago which showed normal coronary arteries as well as a Myoview in  March 2008 which showed an EF of 70% with an apical defect, thought  secondary to the breast attenuation.  She has recently began to once  again experience exertional chest discomfort.  This is very similar to  her previous problems with her hiatal hernia.  However, there was also  some concern for ischemia, so she was referred for a cardiac  catheterization.  This was performed in the outpatient catheterization  lab.   PROCEDURES PERFORMED:  1. Selective coronary angiography.  2. Left heart catheterization.  3. Left ventriculogram.   DESCRIPTION OF THE PROCEDURE:  The risks and indication were explained,  consent was signed, and placed on the chart.  A 4-French arterial sheath  was placed in the right femoral artery using a modified Seldinger  technique.  Standard catheters including a JL4, 3DRC, and angled pigtail  were used for procedure.  All catheter exchanges made over wire.  No  apparent complications.   Central aortic pressure was 160/70 with a mean of 109.  LV pressure  161/60 with an EDP of 20.  There is no aortic stenosis.   Left main was normal.   LAD was a large vessel wrapping the apex, gave off 4  diagonal branches.  It was angiographically normal.   Left circumflex was a large dominant vessel, gave off a large OM-1,  moderate-sized OM-2, and posterolateral and PDA was angiographically  normal.   Right coronary artery is a moderate nondominant vessel which was  angiographically normal.   Left ventriculogram done in the RAO position showed an EF of 50-65%, no  regional wall motion abnormalities.  On panning down over the aorta  after the ventriculogram, there was no evidence of aortic aneurysm.   ASSESSMENT:  1. Normal coronary arteries.  2. Normal left ventricular function.  3. Mildly elevated left ventricular end-diastolic pressures,      consistent with diastolic dysfunction.   PLAN/DISCUSSION:  Her chest pain does not appear to be cardiac in  nature.  I suspect it may be due to recurrent hiatal hernia.  She will  follow back up with Dr. [name] for further evaluation.      Bevelyn Buckles. Bensimhon, MD  Electronically Signed     DRB/MEDQ  D:  04/20/2008  T:  04/21/2008  Job:  [id number]   cc:   [name], M.D.  Hedwig Morton. Juanda Chance, MD  Jesse Sans Wall, MD, [facility]

## 2010-09-16 NOTE — Letter (Signed)
January 26, 2007   Adolph Pollack, M.D.  [address]  [place] [address]   Re:  GIADA, SCHOPPE                DOB:  May 23, 1940   Dear Tawanna Cooler:   I saw Ms. Veillon back today and we discussed the options of doing a left  thoracotomy and a redo hiatal hernia repair and probable Belsey Mark IV  fundoplication.  She still gets shortness of breath probably secondary  to her reflux.  I explained that she would be in the hospital 5-6 days  and that if she decides to go ahead and have the surgery we plan to do  this on October 16th at [facility].  I will let you know when she is  admitted.  Hopefully we can get a good result with the open repair and  hopefully be able to do it if there is not too much scar tissue from her  previous perforation.  Her blood pressure was 117/70, pulse 89,  respirations 18, saturations are 97%.  Lungs were clear to auscultation  and percussion.  I appreciate the opportunity of seeing Ms. Fiorella.    Sincerely,   [name], M.D.  Electronically Signed   DPB/MEDQ  D:  01/26/2007  T:  01/27/2007  Job:  [id number]   cc:   Adolph Pollack, M.D.  Hedwig Morton. Juanda Chance, MD

## 2010-09-16 NOTE — Discharge Summary (Signed)
Emily [name], Emily [name]              ACCOUNT NO.:  [id number]   MEDICAL RECORD NO.:  [id number]          PATIENT TYPE:  INP   LOCATION:  3311                         FACILITY:  [facility]   PHYSICIAN:  Manning Charity, MD     DATE OF BIRTH:  12/31/1940   DATE OF ADMISSION:  05/08/2008  DATE OF DISCHARGE:  05/10/2008                               DISCHARGE SUMMARY   DISCHARGE DIAGNOSES:  1. 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  2. Gastroesophageal reflux disease.  3. 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  4. Esophageal stricture status post Nissen procedure in 1998, which      failed and required a secondary Belsey IV Procedure in 2008, with      residual esophageal dysmotility.  5. Irritable bowel syndrome.  6. Hypertension.  7. Gastritis and esophagitis via EGD in September 2009.  8. Iron deficiency anemia.  9. History of diverticulosis and internal hemorrhoids seen in December      2008 colonoscopy.  10.History of depression.  11.History of noncardiac chest pain with normal coronaries and      ejection fraction of 50-65% based on cardiac catheterization in      December 2009.  12.Hypokalemia, resolved.   DISCHARGE MEDICATIONS:  1. Carafate 1 g p.o. b.i.d.  2. Nexium 40 mg p.o. b.i.d.  3. Reglan 10 mg p.o. b.i.d.  4. Metformin 500 mg p.o. daily.  5. Potassium chloride 20 mEq p.o. daily.  6. Diovan 80 mg p.o. daily.  7. Phenergan 12.5 mg as previously directed.  8. Ultram 50 mg as previously directed.   DISPOSITION AND FOLLOWUP:  The patient was discharged home in stable  condition with no complaints of nausea, vomiting, or gastrointestinal  bleeding noted.  The patient's hemoglobin remained steady for greater  than 24 hours by day of discharge.  The patient is to follow up with Dr.  Claire Shown at the [facility] on  February 18, at 8 o'clock in the morning.  She is to  also follow up with  her primary care physician at the [facility] on [facility] [address] at her earliest convenience.  The patient to call for an  appointment.  At that time, a repeat CBC should be drawn and hemoglobin  level assessed.  Additionally, the result of the patient's TSH and blood  cultures should be followed up on.   PROCEDURES PERFORMED:  An EGD was performed on May 09, 2008, which  showed ulcerative esophagitis with scarring and stricturing distally.  There was a small hiatal hernia noted.  Gastric and duodenal lining  appeared normal.  There was no active bleeding noted.   CONSULTATIONS:  Dr. Yancey Flemings from [facility].   BRIEF ADMITTING HISTORY AND PHYSICAL:  Emily [name] is a 70 year old  African [name] with past medical history significant for  esophageal ulcer by endoscopy in September 2009, esophageal stricture  status post anastomosis and dilatation in September 2009, diverticulosis  and internal hemorrhoids by colonoscopy in 2008, reflux disease, severe  gastroparesis  secondary to diabetes mellitus, iron deficiency anemia,  who presents to the [facility] for complaints of nausea,  vomiting, and mild abdominal pain.  The patient states that she was in  her usual state of health until 3 days prior to admission, when she  drank about 2 ounces of vodka.  She states that she hardly ever drinks  alcohol.  By the next morning, she started having increased nausea and  malaise.  By that night, she had vomiting of dark coffee-ground material  approximately 3 or 4 episodes total.  One day prior to admission, she  noticed that her bowel movement was dark black in color.  States that  this has happened before for several months now but never as dark as the  one that she experienced just prior to admission.  The patient also  complained of mild abdominal pain and subjective chills in the days  leading up to her admission.   She denies any diarrhea, any bright red  blood per rectum, any unusual food or travel.  She also denies any chest  pain, shortness of breath, palpitations, hematemesis, numbness or  weakness, dysuria or hematuria.  The patient says she tends to have  nausea at baseline and mild dysphagia secondary to her gastroparesis and  esophageal dysmotility but has been able to tolerate small meals until  about 2 days prior to admission, when she states that she had increased  nausea with food.  States that she is compliant with all her medications  at home including her proton pump inhibitor.   PHYSICAL EXAMINATION:  VITAL SIGNS:  On admission, included a  temperature of 97.2, blood pressure of 143/91, heart rate of 112,  respiratory rate of 20, oxygen saturation of 98% on room air.  GENERAL:  She was in no acute distress.  EYE:  Extraocular motions to be intact with pupils equal, round,  reactive to light and anicteric sclerae.  ENT:  Showed oropharynx and oral cavity to be clear and somewhat dry  mucous membranes.  NECK:  Supple with no lymphadenopathy.  RESPIRATORY:  Clear to auscultation bilaterally.  CARDIOVASCULAR:  Regular rate and rhythm with no murmurs, rubs, or  gallops.  GI:  Positive bowel sounds and a soft nondistended abdomen.  There was  mild tenderness to palpation in the right upper quadrant and left upper  quadrant but no rebound and guarding.  EXTREMITIES:  No clubbing, cyanosis, or edema.  SKIN:  No jaundice.  NEURO:  The patient to be alert and oriented x3.  Cranial nerves II  through XII were intact and was otherwise nonfocal.  PSYCH:  Appropriate.   LABORATORY DATA:  CBC was remarkable for white blood cell count of 5.6,  hemoglobin of 11.8, platelets 289, ANC of 3.1, MCV of 77.  Sodium 138,  potassium 3.6, chloride 102, bicarb 29, BUN 11, creatinine 0.65, glucose  113, total bilirubin 0.8, alk phos 61, AST 23, ALT 19, total protein  6.9, albumin 4.0, calcium 9.4.  The  patient was FOBT positive.  PT was  13.8, INR 1.0, PTT was 32, lactic acid was 1.6, lipase was 25, magnesium  1.9.  Serum alcohol was less than 5.  Point of care cardiac markers were  negative.  Hemoglobin A1c was 4.8.   HOSPITAL COURSE BY PROBLEM:  1. Nausea, vomiting, and GI bleed.  Initially, the patient was typed      and screened 2 units of packed RBCs with two large bore IVs placed  along with addition of a proton pump inhibitor drip.  The patient's      hemoglobin was first assessed to be at her baseline and in fact was      a little above her baseline of 10.4 during admission.      Gastroenterology was consulted, and she underwent an EGD the      following morning.  The procedure was remarkable for findings      including a ulcerative esophagitis and a small peptic stricture      with no active bleeding noted.  Review of old records revealed that      the patient has had recurrent hospital visits for upper GI bleed,      and it was thought that her current episode was brought upon by the      patient's recent use of alcohol.  The patient was counseled not to      induce any further aggravation of her known esophagitis and was      instructed to stay away from all alcohol.  The patient admitted      that she was only taking her Reglan on an as-needed basis, which      was essentially no more than once per night.  The patient was      instructed that she would have benefit from decreased nausea and      abdominal discomfort if she increased her Reglan dose to a standing      twice daily administration.  The patient was agreeable to this,      although did state that if she takes too much of the Reglan, she      feels lethargic but was willing to try this at home upon discharge.      During her hospitalization, the patient did not have any further      episodes of coffee-ground emesis and her abdominal pain resolved.      The patient was able to tolerate p.o. intake of solids  and liquids      well by day of discharge, and her hemoglobin remained stable by      that time as well.  The patient was started on Carafate per      Gastroenterology recommendations for her esophagitis upon      discharge, and she will be followed up closely on an outpatient      basis by both her primary care Jeramyah Goodpasture as well as her regular GI      physician, Dr. Juanda Chance.  Other causes of her nausea, vomiting, and      abdominal discomfort were ruled out including acute coronary      syndrome as the patient had 3 negative sets of cardiac enzymes upon      admission and was noted to have a normal cardiac catheterization      approximately 1 month prior.  A lipase was checked and this was      found to be normal.  The patient is status post cholecystectomy,      and her liver function tests were within normal limits during      admission indicating no hepatic or gallbladder etiology for her      symptoms.  Serum ethanol level was less than 5 and a urine drug      screen was also found to be negative during this admission.  2. Positive blood culture- The patient had a blood culture, 1 out of 1  that initially returned positive for Gram-positive cocci in      clusters.  This was most likely a contamination and unlikely to      have been an infectious source for the patient's symptoms.  A      repeat set of blood cultures was taken upon discharge, however, and      this will need to be followed up on by her primary care Prestin Munch on      an outpatient basis.  The patient had no other signs or symptoms of      infection during hospitalization and remained afebrile with no      leukocytosis noted by day of discharge.  3. Hypokalemia.  The patient admitted to not taking her home potassium      supplementations for at least 2-3 days prior to this admission, and      this was thought to have contributed to her low potassium level of      3.1.  In addition, her recurrent nausea and vomiting was  also most      likely contributing to this as well.  The patient was repleted with      oral K-Dur, and her potassium was within normal limits at 4.3 by      day of discharge.  Of note, the patient's magnesium level was      checked and found to be within normal limits at 1.9 during this      admission as well.  4. Tachycardia.  On the day of discharge, the patient was noted to      have a heart rate on the monitor of 120.  The patient had no      complaints other than a feeling of feeling hot, which resolved      after several minutes.  The patient's blood pressure remained      normal and she was afebrile at that time.  The patient denied any      chest pain, shortness of breath, or palpitations.  A TSH was      checked as none could be found on the chart.  Unfortunately, this      was still pending by day of discharge and will need to be followed      up on by her PCP.   DISCHARGE LABS AND VITALS:  On day of discharge, the patient had a blood  pressure of 146/63, heart rate of 86, temperature of 97.9, respiratory  rate 16, oxygen saturation 99% on room air.  Sodium was 140, potassium  4.3, chloride 108, bicarb 28, BUN 3, creatinine 0.57, glucose 120, white  blood cell count 6.3, hemoglobin 11.1, MCV 79.0, platelets 289,  magnesium 1.9.      Lucy Antigua, MD  Electronically Signed      Manning Charity, MD  Electronically Signed    RK/MEDQ  D:  05/10/2008  T:  05/11/2008  Job:  [id number]   cc:   Wilhemina Bonito. Marina Goodell, MD  Dr. Emeline Darling  Dr. Claire Shown

## 2010-09-16 NOTE — Op Note (Signed)
Emily [name], Emily [name]              ACCOUNT NO.:  [id number]   MEDICAL RECORD NO.:  [id number]          PATIENT TYPE:  INP   LOCATION:  3012                         FACILITY:  [facility]   PHYSICIAN:  Adolph Pollack, M.D.DATE OF BIRTH:  June 10, 1940   DATE OF PROCEDURE:  05/10/2007  DATE OF DISCHARGE:                               OPERATIVE REPORT   PREOPERATIVE DIAGNOSES:  Biliary pancreatitis.   POSTOPERATIVE DIAGNOSES:  1. Biliary pancreatitis.  2. Abdominal adhesions.   PROCEDURE:  Laparoscopic lysis of adhesions followed by laparoscopic  cholecystectomy with intraoperative cholangiogram.   SURGEON:  Adolph Pollack, MD.   ASSISTANT:  Violeta Gelinas, MD.   ANESTHESIA:  General.   INDICATIONS:  This 70 year old female was admitted May 08, 2007 with  biliary pancreatitis secondary to cholelithiasis.  She has improved  clinically and chemically and now presents for laparoscopic possible  open cholecystectomy.  The procedure and risks were discussed with her  preoperatively.   TECHNIQUE:  She is seen in the holding area and then brought to the  operating room, placed supine on the operating table and general  anesthetic was administered.  The abdominal wall was sterilely prepped  and draped.  She had previous upper abdominal surgery.  A small  subumbilical incision was made through the skin and subcutaneous tissue.  An incision was made in the fascia and then the peritoneum and the  peritoneal cavity was entered.  A Hassan trocar was introduced into the  peritoneal cavity and pneumoperitoneum created by insufflation of CO2  gas.   Next the laparoscope introduced and significant adhesions between the  omentum and the right upper quadrant abdominal wall were noted.  I was  able to place a 5 mm trocar in the right lower quadrant.  Using sharp  dissection, I was able to lyse adhesions and free up the right upper  quadrant abdominal wall. There were some adhesions between  the omentum  and the gallbladder as well.  I spent approximately 30-40 minutes doing  adhesiolysis in order to free up the right upper quadrant region.  I was  then able to place a 10 mm trocar through an epigastric incision and  another 5 mm trocar in the right mid lateral abdomen.  The fundus of the  gallbladder was grasped and retracted toward the right shoulder.  Omental adhesions were peeled away from the gallbladder and infundibulum  was grasped and retracted laterally. Using careful blunt dissection, I  mobilized the infundibulum.  I isolated the cystic duct and cystic  artery.  Windows were created around both of these.  I clipped and  divided the cystic artery.  I then placed a clip at the cystic duct  gallbladder junction.  A small incision was made in the cystic duct.  A  cholangiocatheter was introduced in the anterior abdominal wall and a  cholangiogram was performed.   Under real time fluoroscopy, dilute contrast was injected into the  cystic duct. The common hepatic, right and left hepatic and common bile  ducts were all opacified and contrast drained promptly into the duodenum  without obvious evidence  of obstruction.  The final report is pending  the radiologist interpretation.   The cholangiocatheter was removed, the cystic duct was clipped  3 times  proximally and divided.  I then dissected the gallbladder free from the  liver using electrocautery with minimal spillage of bile.  The  gallbladder was then placed in Endopouch bag.  The perihepatic area and  gallbladder fossa were irrigated.  Bleeding points were  controlled with  electrocautery.  The area was inspected further and no bleeding or bile  leak was noted.  I then carefully inspected the abdominal cavity and no  injury to small intestine was noted, no bleeding from the omentum was  noted.  Irrigation fluid was evacuated.   The gallbladder was then removed and the bag through the subumbilical  port.  The  remaining trocars removed and pneumoperitoneum was released.   The subumbilical fascia defect was closed with #0 Vicryl pursestring  suture.  The skin incisions were closed with 4-0 Monocryl subcuticular  stitches followed by Steri-Strips and sterile dressings.  She tolerated  the procedure well without apparent any complications.  She subsequently  was taken to the recovery room in satisfactory condition.      Adolph Pollack, M.D.  Electronically Signed     TJR/MEDQ  D:  05/10/2007  T:  05/10/2007  Job:  [id number]   cc:   Hedwig Morton. Juanda Chance, MD

## 2010-09-16 NOTE — Assessment & Plan Note (Signed)
[name]                            CARDIOLOGY OFFICE NOTE   LASHONDRA, Emily [name]                       MRN:          [id number]  DATE:03/28/2008                            DOB:          1941-02-27    I was asked by Dr. [name] to consult on Emily [name] with exertional  chest tightness in the center of her chest over the last month or so.   HISTORY OF PRESENT ILLNESS:  She is a 70 years of age who has a history  of severe problems of hiatal hernia, requiring repair twice.  The last  repair was last year with Dr. Edwyna Shell.   She began to have exertional chest tightness about a month ago.  It goes  away when she stops walking.  It comes back when she restarts.  It is  not always consistent.   She feels strongly this is not her reflux.   She denies any orthopnea, PND, peripheral edema, symptoms of aspiration,  tachypalpitations, syncope, or presyncope.   PAST MEDICAL HISTORY:  Significant for typical symptoms in the past,  having an adenosine rest stress Myoview, which was normal in March 2008.  Her ejection fraction at that time was 70%.  She had an apical defect,  which was felt to be secondary to breast attenuation.  Prior to this  adenosine study, she may tried to exercise, but she can only walk about  a minute and a half.  She says this because her hiatal hernia was  bothering her at that time.  She think she can walk now.   ALLERGIES:  She has never had any reaction to dye.   CURRENT MEDICATIONS:  1. Diovan 80 mg a day.  2. Potassium 20 mEq a day.  3. Metformin 500 mg a day.  4. Nexium 40 mg p.o. b.i.d.   PAST MEDICAL HISTORY:  She has had hypertension and she is diabetic.   PAST SURGICAL HISTORY:  Hernia repair twice and cholecystectomy.   FAMILY HISTORY:  Both parents died of heart attack, age is not given.   SOCIAL HISTORY:  She is retired.  She is single.  She has 1 child.   REVIEW OF SYSTEMS:  Positive for allergies, chronic  anemia,  constipation, fatigue, stomach and bowel disorder, ulcers, kidney  disease, diabetes, arthritis, anxiety, and depression.  Of note, her  last creatinine was 0.8.  Her hemoglobin was 11.4 in January 23, 2008.   PHYSICAL EXAMINATION:  GENERAL:  She is a chronically ill-appearing  female in no acute distress.  SKIN:  Warm and dry.  VITAL SIGNS:  Her blood pressure is 108/74 and pulse was 100-110 and  regular.  Her weight is 134.  She is 5 feet 4 inches.  She weighed 163  in January 2009.  HEENT:  She has some temporal wasting.  Arcus senilis.  PERRLA.  Sclerae  are muddy.  Facial symmetry is normal.  Dentition is satisfactory.  Oral  mucosa okay.  NECK:  Carotid upstrokes were equal bilaterally without bruits.  No JVD.  Thyroid is not enlarged.  Trachea is  midline.  LUNGS:  Clear to auscultation and percussion.  HEART:  Regular rate and rhythm.  Widely split S2.  ABDOMEN:  Soft.  Good bowel sounds.  No midline bruit.  No hepatomegaly.  EXTREMITIES:  No cyanosis, clubbing, or edema.  Pulses are intact.  NEUROLOGIC:  Intact.  SKIN:  Unremarkable.   EKG shows sinus tachycardia with a right bundle with some ST-segment  changes inferiorly.  I have no EKG to compare.  She has some low voltage  in the lateral leads.   EKG again shows this right bundle with some ST-segment changes  inferiorly and low voltage in the lateral leads.  There is no PR  depression.  There is no other ST-segment elevation other than in lead  III and aVF.   ASSESSMENT AND PLAN:  Ms. Hegel has exertional chest tightness  consistent with exertional angina.  She had this before with a negative  stress Myoview.  With her diabetes and hypertension, however, I think we  need to reevaluate this.  She also has an abnormal EKG.  We will perform  a 2D echocardiogram to make sure she has normal wall motion.  She had a  2D echocardiogram in the past, which was normal in 2007.     Thomas C. Daleen Squibb, MD, [facility]   Electronically Signed    TCW/MedQ  DD: 03/28/2008  DT: 03/28/2008  Job #: [id number]

## 2010-09-16 NOTE — Letter (Signed)
May 18, 2007   Hedwig Morton. Juanda Chance, MD  [address]  [place], [place] [address]   Re:  JAMELLA, GRAYER                DOB:  [birth year]   Dear Verlee Monte:   I saw the patient back for followup today.  Her blood pressure was  118/80, pulse 94, respirations 18, sats were 95%.  The patient says she  continues to lose weight.  She recently had a laparoscopic  cholecystectomy by Dr. Abbey Chatters.  Chest x-ray today looks good in the  chest, but there looks like there might be a bezoar in the stomach.  I  cannot tell for sure from the chest x-ray, but this could go along with  possibly delayed emptying.  She still says she has some nausea and some  dysphagia.  I asked her to see you again to see if she would benefit  from a repeat endoscopy.  I will see her again in 3 weeks.  It is hard  to tell whether she is getting any better since she has recently had  further surgery, but I think she is, and I would appreciate your help  with the situation.   [name], M.D.  Electronically Signed   DPB/MEDQ  D:  05/18/2007  T:  05/18/2007  Job:  [id number]   cc:   Adolph Pollack, M.D.

## 2010-09-16 NOTE — Assessment & Plan Note (Signed)
OFFICE VISIT   Emily [name], Emily [name]  DOB:  1940/12/02                                        June 29, 2007  CHART #:  [id number]   Emily [name] came for follow up today.  She is having some mild post  thoracotomy pain.  Her weight is stable.  Her blood pressure is 116/75,  pulse 92, respirations 18, saturation 99%.  She is on Reglan 3-4 times a  day and seems to be tolerating it well.  She has occasional dysphagia,  but this has improved.  Chest x-ray showed normal postoperative changes.  There is some bilateral atelectasis, but overall, thought was doing  well.  Plan to see her back again in 6 weeks to two months with another  chest x-ray.   [name], M.D.  Electronically Signed   DPB/MEDQ  D:  06/29/2007  T:  06/30/2007  Job:  [id number]

## 2010-09-16 NOTE — Assessment & Plan Note (Signed)
[facility]                         GASTROENTEROLOGY OFFICE NOTE   CANDACE, BEGUE                       MRN:          [id number]  DATE:05/31/2007                            DOB:          1941-02-01    [name] is a 70 year old African [name] with history of  severe gastroesophageal reflux disease, severe esophageal stricture  dating back to 1986, status post Nissan__________  cation 1998 and  failure of Nissan __________  cation status post Belsey 4 procedure by  Dr. Edwyna Shell October 2008 with reconstruction via left thoracotomy.  She  just recovered from emergency laparoscopic cholecystectomy after an  episode of biliary pancreatitis three weeks ago.  She comes today for  followup.  Her main complaint is food sticking in her esophagus.  It is  liquids as well as solids, but mostly solid food.  She also has early  satiety as if food was not emptying out of her stomach.  She had that  sensation right after the thoracotomy and we did an upper endoscopy only  to find out that she had retained food in the stomach as well as mild  esophageal stricture, which was dilated with 15 mm dilator.  She was  suspected to have mild gastroparesis on the basis of diabetes as well as  possible allergy to pain medications.  She has been on Nexium 40 mg  twice a day and metoclopramide 10 mg three times a day.  She was  initially improved, but her symptoms have stopped improving and she  still has daily problems with swallowing, although some days the  problems are very minimal.  She is off pain medications now.   MEDICATIONS:  1. Hydrochlorothiazide 25 mg p.o. daily.  2. Prinivil 10 mg p.o. daily.  3. Atenolol 1/2 tablet daily.  4. Aspirin 325 mg daily.  5. Carisoprodol 350 mg t.i.d.  6. Nexium 40 mg p.o. b.i.d.   PHYSICAL EXAMINATION:  Blood pressure: 104/62.  Pulse:  64.  Weight:  163 pounds.  This represents 10 pound weight loss since March 29, 1999.  She was alert, oriented.  Appearance slimmer than on her last  exam.  LUNGS:  Clear to auscultation.  There was tenderness over the left  thoracotomy incision, but the incision was healed well.  There was no  hematoma and no rub.  COR:  Normal S1, S2.  ABDOMEN:  Soft with well healed surgical scars.  Marked tenderness  overlying the periumbilical area from telescope incision.  There was  also tenderness in epigastric subxiphoid area.  Bowel sounds were  normoactive.  There was no distension.   IMPRESSION:  13. 70 year old female with recent post thoracotomy for Belsey      procedure, repair of undone Nissan __________  cation.  She has      problems with dysphagia, which may be a combination of mild      stricture as well as esophageal dysmotility.  2. Rule out gastroparesis.  There was retained food on recent upper      endoscopy two months ago.  Gastroparesis was at that time on the  basis of pain medications and may be also contributed by diabetic      neuropathy.   PLAN:  1. Nexium 40 mg p.o. b.i.d.  2. Continue Reglan 10 mg p.o. q.i.d. in the morning and nightly.  3. Barium swallow and upper GI series to assess the upper GI anatomy.      If there is a stricture, we will proceed with dilatation.  I      assured patient that her symptoms may improve with time since she      has been only eight weeks since the major surgery and only three      weeks since cholecystectomy.  We will continue to work with her.     Hedwig Morton. Juanda Chance, MD  Electronically Signed    DMB/MedQ  DD: 05/31/2007  DT: 05/31/2007  Job #: [id number]   cc:   Duke Salvia, M.D.

## 2010-09-16 NOTE — Letter (Signed)
August 24, 2007   Hedwig Morton. Juanda Chance, MD  [address]  [place], [place] [address].   Re:  TRENESHA, ALCAIDE                DOB:  16-Jul-1940   Dear Verlee Monte,   I saw Ms. Schiavo back today.  Her weight is stable.  Her blood pressure  is 132/79, pulse 100, respirations 18, saturation 97%.  Overall, she  says she is eating better and doing better, but had a recent endoscopy  which showed that she still had some delayed emptying of her stomach,  but then she says this weekend she had to go to the emergency room  because of vomiting.  She is a very poor historian.  Her chest x-ray was  stable.  I told her I would continue to follow her, and we will see her  back in three months for another chest x-ray.  I told her she might want  to check with you if she has more trouble with the nausea and vomiting.   [name], M.D.  Electronically Signed   DPB/MEDQ  D:  08/24/2007  T:  08/24/2007  Job:  [id number]

## 2010-09-16 NOTE — Discharge Summary (Signed)
NAMEBRYLIE, Emily [name]              ACCOUNT NO.:  [id number]   MEDICAL RECORD NO.:  [id number]          PATIENT TYPE:  INP   LOCATION:  3012                         FACILITY:  [facility]   PHYSICIAN:  Adolph Pollack, M.D.DATE OF BIRTH:  Mar 09, 1941   DATE OF ADMISSION:  05/08/2007  DATE OF DISCHARGE:  05/13/2007                               DISCHARGE SUMMARY   CONSULTANTS:  Dr. Leone Payor with Gastroenterology.   CHIEF COMPLAINT/REASON FOR ADMISSION:  Emily [name] is a 70 year old  female patient well known to [facility] from prior hiatal hernia repair in the  distant past. She developed sudden onset of epigastric and right upper  quadrant pain two days prior which prompted her to seek attention in the  ER.  The initial pain was quite severe and awakened her from sleep, but  did resolve 24 hours prior to coming in.  She had a similar episode that  was not relieved with methods used at the house.  In the ER, she was  afebrile, vital signs were otherwise stable and mildly hypertensive.  She had mild epigastric and bilateral upper quadrant tenderness without  guarding.  Her lipase was elevated at 396.  White blood cell count was  9,400, hemoglobin 12.3, AST 61 and ALT 64.  An abdominal ultrasound  revealed gallstones with gallbladder wall thickening.  The patient was  admitted  with the following diagnoses:  (1) Biliary pancreatitis; (2)  acute cholecystitis/cholelithiasis; (3) questionable  choledocholithiasis; (4) hypertension; (5) known diabetes; (6) history  of prior hiatal hernia repair with a redo surgery in October 2008.   HOSPITAL COURSE:  The patient was admitted and she was placed on n.p.o.  status, IV fluids, bowel rest and IV antibiotics consisting of Zosyn.  Because of the presentation consistent with biliary pancreatitis,  Gastroenterology was consulted.  Dr. Juanda Chance is the patient's primary  gastroenterologist, but Dr. Leone Payor was the on-call physician for the  hospital the week the  patient was hospitalized.  Their recommendations  were to continue to follow the patient's enzymes and if the enzymes  continued to trend down, to wait on ERCP until after a cholecystectomy  was completed, i.e. if patient had a positive intraoperative  cholangiogram, they would proceed with ERCP.   Over the next several days, the patient continued to slowly improve.  Her lipase and amylase trended down.  Her white count remained normal  and her LFTs trended down as well.   By May 10, 2007, the patient was deemed appropriate to proceed with  surgical intervention.  Her LFTs were normal and her lipase was 57, so  she was taken to the OR where she underwent a laparoscopic  cholecystectomy with lysis of adhesions and was found to have a normal  intraoperative cholangiogram.  This was done by Dr. Abbey Chatters.  She was  found to have significant right upper quadrant adhesions.   Over the next several days, the patient had an uneventful postoperative  course.  Her labs remained normal.  She had a mild elevation in her  white count postoperatively at 10,800.  Her hemoglobin was 10.1 total  bilirubin  0.7, AST 60, ALT 50, lipase 35 and amylase 42.  By  postoperative day #3, the patient was ambulating without difficulty.  Vital signs were stable.  The incisions were clean, dry and intact.  She  was tolerating a solid diet.  She was having some difficulty with  intermittent decrease in sats on room air, but her sat was greater than  90% with ambulation, so she was otherwise deemed appropriate for  discharge home.   FINAL DISCHARGE DIAGNOSES:  1. Abdominal pain secondary to biliary pancreatitis with associated      acute cholecystitis.  2. Transient choledocholithiasis, resolved.  3. Biliary pancreatitis, resolved.  4. Diabetes.  5. Hypertension.   DISCHARGE MEDICATIONS:  The patient will resume the following home  medications:  1. Nexium 40 mg daily.  2. Hydrochlorothiazide 20 mg  daily.  3. Atenolol 20 mg daily.  4. Diovan 20 mg daily, but I am not sure about that dosage.  5. Metformin 500 mg daily.  6. Reglan 20 mg daily.  7. In addition, the patient has been given the following prescription:      Dilaudid p.o. for pain.   DISCHARGE INSTRUCTIONS:  1. Diet:  A bland diet.  2. Activity:  Increase activity slowly.  Walk up steps.  No lifting      greater than 10 pounds for two weeks.  No driving for one week.  3. Wound care:  Steri-Strips in place, allow to fall off.  4. Return to work in two weeks.   FOLLOW-UP APPOINTMENTS:  She is call Dr. Maris Berger office at [phone]  to be seen in two weeks.      Allison L. Rennis Harding, N.P.      Adolph Pollack, M.D.  Electronically Signed    ALE/MEDQ  D:  05/27/2007  T:  05/27/2007  Job:  [id number]   cc:   Hedwig Morton. Juanda Chance, MD

## 2010-09-16 NOTE — Letter (Signed)
December 14, 2006   Adolph Pollack, M.D.  [address], [address]  [place], [place]  [address]   Re:  Emily [name], Emily [name]                DOB:  21-Jul-1940   Dear Tawanna Cooler:   I appreciate the opportunity to see Ms. Sweeting.  This 70 year old  African [name] has had a long history of GERD and hiatal hernia  and had a Nissan fundoplication originating approximately 10 years ago  at which time she had to have exploratory laparotomy for paraesophageal  perforation.  She did well for awhile and then started having episodes  of similar symptoms that she originally had with shortness of breath,  occasional reflux and substernal pressure, particularly with ambulation.  She was seen by Dr. Sherene Sires and apparently his pulmonary function tests  were normal and he thought this was secondary to her current hiatal  hernia.  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  She was placed on Nexium and seen by [name] who did  manometry which showed a normal that she had a normal esophageal  manometry study with a normal lower esophageal sphincter and normal  relaxation and normal peristaltic waves.  She is referred here for  possible recurrent hiatal hernia repair.   PAST MEDICAL HISTORY:  1. No melena.  2. Cardiac workup by Dr. Excell Seltzer showed a negative stress test,      although, she had poor exercise tolerance.  3. Right pulmonary nodule that is stable .  4. Diabetes type 2.  5. Hypertension.  6. Arthritis.  7. Anxiety disorder.  8. Depression.  9. History of anemia.   ALLERGIES:  No known drug allergies.   MEDICATIONS:  1. Hydrochlorothiazide 25 mg daily.  2. Metformin 500 mg one a day.  3. Atenolol 25 mg daily.  4. Nexium 40 mg b.i.d.  5. Diovan 20 mg once daily.  6. Reglan 20 mg q.i.d.  7. Tylenol as needed.   FAMILY HISTORY:  Positive for diabetes, heart disease and cancer.   SOCIAL HISTORY:  She does not drink or smoke, but she is single, has a  daughter.   REVIEW OF SYSTEMS:  She has no recent weight gain.  She is 187 pounds,  she is 5 feet 4 inches.  CARDIAC:  She gets shortness of breath with  exertion.  She has chest tightness and pressure, particularly with  ambulation.  PULMONARY:  No hemoptysis, asthma or wheezing.  GI:  Peptic  ulcer disease, reflux, hiatal hernia, abdominal pain and constipation.  GU:  No dysuria or kidney disease, vascular claudication, DVD, PA.  NEUROLOGICAL:  Headaches.  MUSCULOSKELETAL:  She has arthritis, joint  pain and muscular pain.  PSYCHIATRIC:  She has been treated for  nervousness.  ENT:  She has recent decrease in her hearing.  HEMATOLOGIC:  No problems with clotting.   PHYSICAL EXAMINATION:  GENERAL:  She is a morbidly obese African  [name] in no acute distress.  VITAL SIGNS:  Blood pressure 152/91, pulse 84, respirations 18,  saturations 96%.  HEENT:  Unremarkable.  NECK:  Supple without thyromegaly.  No supraclavicular or axillary  adenopathy.  CHEST:  Clear to auscultation and percussion.  HEART:  Regular sinus rhythm.  ABDOMEN:  Soft, no hepatosplenomegaly.  There are surgical scars.  EXTREMITIES:  Pulses are 2+.  There is no clubbing or  edema.  NEUROLOGICAL:  Oriented x3.   ASSESSMENT/PLAN:  I have discussed the risks and benefits of doing a  redo hiatal hernia repair through the left chest.  It would be  difficult, but I think we could do the operation.  She is  going to think about it and will see [place] back in six weeks before making  a final decision.  I appreciate the opportunity of seeing Ms. Nolden.   [name], M.D.  Electronically Signed   DPB/MEDQ  D:  12/14/2006  T:  12/16/2006  Job:  [id number]   cc:   Hedwig Morton. Juanda Chance, MD

## 2010-09-16 NOTE — Letter (Signed)
August 21, 2008    Dr. Beverly Gust, Chairman  Department Division of Gastroenterology  [facility], [place] [address]   RE:  [name], [name]  MRN:  [id number]  /  DOB:  Jul 09, 1940   Dear Dr. [name]:   I appreciate very much your following [name], whom we had  referred to you in December 2009 with specific questions concerning her  severe gastroparesis.  As you remember, she had a failed Nissen  fundoplication and subsequently underwent Belsey procedure.  After the  procedure, she has developed even more pronounced gastroparesis and I  have raised a question of possible inadvertant vagotomy.  I had specific  questions to you considering treatment of her severe gastroparesis.  One  is, would she be a candidate for surgical drainage procedure or possibly  jejunal tube feedings over a long period of time, or possibly placement  of a gastric pacemaker.  She has called me recently voicing some  difficulty in commuting to [facility] repeatedly for past 4 months,  and it seems that she may not be able to do that much longer since she  is dependent on her daughter for transportation.  I would very much  appreciate it if you could give me your opinion as to any of those  options in treating  her severe gastroparesis.  She is currently on a full liquid diet, and I  have not seen her for at least 6 weeks, so I am not sure whether her  weight continues to drop as it did last fall from initial 180 pounds to,  gradually 147 pounds and before going to [facility] she was at 139  pounds.  Thank you so much for your opinion.  I will look forward to  your assessment.    Sincerely,      Emily [name]. Juanda Chance, MD  Electronically Signed    DMB/MedQ  DD: 08/21/2008  DT: 08/21/2008  Job #: [id number]

## 2010-09-16 NOTE — Assessment & Plan Note (Signed)
OFFICE VISIT   OFFIE, PICKRON  DOB:  [birth year]                                        May 02, 2008  CHART #:  [id number]   The patient came to the office today and chest x-ray was stable with no  acute findings.  There is some mild scarring.  She states she is having  some problems as far as pain that she states it was like when she had a  hernia before.  She underwent a cardiac evaluation by Dr. Daleen Squibb and it  was apparently negative.  She is now seeing Dr. Knute Neu over at [facility]  who has some tests and endoscopy scheduled for February.  Her weight has  remained stable.  Her blood pressure was 113/79, pulse 96, respirations  18, and sats were 99%.  I will just see her again in 6 months with  another chest x-ray and continue to be followed as she is not a  candidate for any further surgical procedure.   [name], M.D.  Electronically Signed   DPB/MEDQ  D:  05/02/2008  T:  05/03/2008  Job:  [id number]

## 2010-09-16 NOTE — Telephone Encounter (Signed)
rx sent for prevacid solutab to patient's pharmacy. Dr Juanda Chance, patient is also now requesting refills on voltaren for her LUQ abdominal pain (it appears Gunnar Fusi gave her this in 3/28/120. Do you want me to refill this?

## 2010-09-16 NOTE — Assessment & Plan Note (Signed)
[name]                         GASTROENTEROLOGY OFFICE NOTE   Emily [name], Emily [name]                       MRN:          [id number]  DATE:03/09/2007                            DOB:          03-Sep-1940    Emily [name] is a very nice, 70 year old, African-[name] who just  underwent transthoracic repair of paraesophageal hernia status post  remote Nissen fundoplication and esophageal perforation by Dr. Edwyna Shell.  She was discharged from the hospital about 10 days ago. She underwent  Belsey mark for reconstruction. The surgery was done at Dr. Scheryl Darter  recommendations after she initially saw Dr. Abbey Chatters. She had  esophageal manometry which showed normal motility. Historically she has  a history of benign distal esophageal stricture and severe  gastroesophageal reflux disease dating back to 1986 when she required  high-dose acid suppressing agent and recurrent esophageal dilations. She  is here today because she has pain in the left side of the chest which  is clearly related to her recent surgery but also she said she cannot  swallow. She points to two areas of problems.  One is in the back of her  throat.  The pills cannot go down and sometimes even liquids. She has  some sore throat and coated tongue. The other complaint is food sticks  all the way down in the distal part of the sternum. She does not have  any problems with liquids. There has been some odynophagia as well. The  patient has continued on Nexium 40 mg p.o. b.i.d. Preoperatively she  weighed 187 pounds, she is currently 172 pounds. She has been mostly on  liquids since her discharge from the hospital.   MEDICATIONS:  1. Nexium 40 mg p.o. b.i.d.  2. HCTZ 25 mg p.o. daily.  3. Atenolol 25 mg p.o. daily.  4. Diovan 1 daily.  5. Metoclopramide 10 mg q.i.d.  6. Metformin 500 mg q.a.m.   PHYSICAL EXAMINATION:  Blood pressure 90/58, pulse 60 and weight 172  pounds.  She was alert  and oriented, somewhat depressed-appearing.  LUNGS:  Clear to auscultation.  Jugular veins were not distended.  COR:  Normal S1, normal S2.  She had a well healed thoracotomy scar in the left chest which was still  somewhat tender but appeared healing well. On lying down, there was  definite tenderness along the left costal margin. The epigastrium was  normal. Costochondral junctions were normal.  Lower abdomen was unremarkable.  EXTREMITIES:  No edema.   IMPRESSION:  A 70 year old, African-[name]  2 weeks post Belsey  gastroplasty fo.r undone Nissen Fundoplicatio, having dificulty which  in swallowing which may be related to ulcerative esophageal motility due  to surgery. It may  recover spontaneously as the time passes. Rule out  candida esophagitis, rule out reflux esophagitis, rule out recurrent  esophageal stricture.   PLAN:  1. Magic mouthwash 1 teaspoon 4 times a day.  2. Carafate slurry 10 mL p.o. q.i.d.  3. Continue Nexium 40 mg p.o. b.i.d.  4. Upper endoscopy scheduled to look for structural changes in her      esophagus.  5.  Liquid pain medications:  Vicodin 7.5/500 to take q.4-6 h p.r.n.      pain in place of the Percocet which he is to break up and has      trouble swallowing. Percocet may be also contributing to her      esophageal dysmotility. Vicodin could do that too and I told the      patient to try to minimize her pain medications. The chest wall      pain on the right in her left thorax will slowly get better. I told      to be that she should expect some discomfort for a while. She is to      followup with Dr. Edwyna Shell for her postoperative followup.     Emily [name]. Emily Chance, MD  Electronically Signed    DMB/MedQ  DD: 03/09/2007  DT: 03/10/2007  Job #: [id number]   cc:   Adolph Pollack, M.D.  [name], M.D.  Denies Emeline Darling, MD

## 2010-09-16 NOTE — Letter (Signed)
March 16, 2007   [name], MD  [address]  [place], [place]  [address]   Re:  SARIN, COMUNALE                DOB:  [birth year]   Dear Verlee Monte,   I saw Ms. Dougal back today.  I appreciate you doing esophageal  diltation on her.  She says she is swallowing better and doing better.  She is still having some mild pain and I started her on 100 mg of  Celebrex twice a day for about 14 days to see if this will help her from  the pain.  Her chest x-ray was stable.  Her incisions are welled healed.  Her blood pressure was 126/76, pulse 100, respirations 18, sats were  96%.  I will see her back again in 3 weeks for follow-up.   Sincerely,   [name], M.D.  Electronically Signed   DPB/MEDQ  D:  03/16/2007  T:  03/17/2007  Job:  [id number]   cc:   Hedwig Morton. Juanda Chance, MD

## 2010-09-16 NOTE — Consult Note (Signed)
Emily [name], Emily [name]              ACCOUNT NO.:  [id number]   MEDICAL RECORD NO.:  [id number]          PATIENT TYPE:  AMB   LOCATION:  ENDO                         FACILITY:  [facility]   PHYSICIAN:  Hedwig Morton. Juanda Chance, MD     DATE OF BIRTH:  03-01-1941   DATE OF CONSULTATION:  DATE OF DISCHARGE:  11/15/2006                                 CONSULTATION   PROCEDURE:  Esophageal monometry.   INDICATIONS:  This 70 year old African [name] has a history of  severe gastroesophageal reflux disease, status post nisin fundoplication  with questionable undone of fundoplication as per recurrence of  patient's symptoms and upper endoscopy findings.  She is being evaluated  for redo of the nisin fundoplication.   RESULTS:  A 4-channel express protocol was carried out with placement of  a catheter through naris.  Lower esophageal sphincter proximally was  located at 40.5 cm from the naris with the proximal end at 40.5 cm and  the distal end at 45 cm.  Total ileus length was 4.5 cm.  The average  lower esophageal sphincter pressure was 27 mmHg.  This represents a  normal LES pressure.  No measurement for LES between 10 to 45 mmHg.  After 11 wet swallows, the LES relaxed to the baseline 71% of the time  which is normal.  Residual pressure was 7.6 mmHg.   Peristaltic waves were measured in approximate distal esophagus up to 10  wet swallows.  All contractions had a normal amplitude and duration.  There was 1 non transmitted contraction.  There were no repetitive or  retrograde contractions.   Upper esophageal sphincter after 6 swallows showed normal amplitude and  duration.  It relaxed to the baseline with each swallow.   IMPRESSION:  This is a normal esophageal monometry study with normal  lower esophageal sphincter with normal relaxation and normal peristaltic  waves.      Hedwig Morton. Juanda Chance, MD  Electronically Signed     DMB/MEDQ  D:  11/23/2006  T:  11/24/2006  Job:  [id number]   cc:    Adolph Pollack, M.D.

## 2010-09-16 NOTE — Assessment & Plan Note (Signed)
OFFICE VISIT   Emily, [name]  DOB:  15-Apr-1941                                        December 13, 2007  CHART #:  [id number]   The patient comes back today for a 70-month followup.  She is status post  left thoracotomy with redo repair of hiatal hernia with [facility] for  reconstruction.  Since her last visit, she reports continued anorexia  and weight loss.  She also is still having some nausea and vomiting with  eating.  She has seen Dr. Juanda Chance in the last few months and has a  followup appointment in another month or so.  She was started on Reglan  with maybe some relief.  She states that over the last 2 to 3 weeks, her  appetite has actually improved a little bit.  She continues to have some  discomfort at the incisional sites with deep inspiration.   PHYSICAL EXAMINATION:  VITAL SIGNS:  Blood pressure is 131/82, pulse is  103, respirations 18, and O2 sat 97%.  CHEST:  Left thoracotomy incision is well healed.  HEART:  Regular rate and rhythm.  LUNGS:  Clear.  ABDOMEN:  Unremarkable.   Chest x-ray is stable with no acute changes.   ASSESSMENT AND PLAN:  Dr. Edwyna Shell has seen and evaluated the patient and  reviewed her films today.  It sounds like her major issues are related  to her gastroparesis.  From a postsurgical  standpoint, she is doing a little better.  We will plan on seeing her  back for followup in 4 months.  She will see Dr. Juanda Chance as scheduled in  the next 1 to 2 months.   [name], M.D.  Electronically Signed   GC/MEDQ  D:  12/13/2007  T:  12/14/2007  Job:  [id number]   cc:   Hedwig Morton. Juanda Chance, MD

## 2010-09-16 NOTE — Letter (Signed)
January 19, 2008    Adolph Pollack, M.D.  [address]  [place], [place] [address]   RE:  ALMENDRA, LORIA  MRN:  [id number]  /  DOB:  May 11, 1940   Dear Tawanna Cooler   I wwould like to give You a follow up and ask if You have any  suggestions for care of Mrs malyssa maris whom You saw last year for  undone Nissen fundoplication and sent her to [facility] for [facility]  reconstruction which was done in October 2008.  She underwent the  reconstruction via left thoracotomy almost a year ago.  She continues to  have problems with nausea and vomiting as well as difficulty in  swallowing.  I have now endoscoped her several times since the surgery  last year and it appears that she has a  severe gastroparesis.  Her  gastric size is normal and her gastric outlet is wide open, but she has  a large amount of retained food not only in the stomach, but also in her  esophagus which now appears to be somewhat dilated and has decreased  peristalsis.  Ms. Facey has continued to lose weight from initial 185  pounds last year to subsequently 167 pounds and currently 147 pounds.  She is already on maximum medical therapy with the double-dose PPIs and  Reglan.  She is on small feedings  and  on strict antireflux measures,  but I do not quite understand why she is having the gastroparesis unless  she had an incidental vagotomy or it is just due to her diabetic distal  neuropathy.  If she continues to lose weight and be unable to eat, I may  send her to you for consideration of a drainage procedure to facilitate  the gastric emptying and possibly to decrease her severe reflux.  This  is just a followup letter.   Thank you very much for being involved in her care in the past.  PS I have been able to talk to You today and I appreciate Your  suggesting Dr Oliver Barre at [facility], as well as  PEG/PEJ as a last  resort.     Sincerely,      Hedwig Morton. Juanda Chance, MD  Electronically Signed    DMB/MedQ  DD: 01/19/2008  DT: 01/20/2008  Job #: [id number]

## 2010-09-18 ENCOUNTER — Telehealth: Payer: Self-pay | Admitting: *Deleted

## 2010-09-18 NOTE — Telephone Encounter (Signed)
Message copied by Vernia Buff on Thu Sep 18, 2010  1:34 PM ------      Message from: [name]      Created: Thu Sep 18, 2010  1:21 PM      Regarding: refill on Voltaren       We will not be  refilling her Voltaren at this time. DB      ----- Message -----         From: Vernia Buff, CMA         Sent: 09/18/2010   8:49 AM           To: Hart Carwin, MD            Dr Juanda Chance-      I have a refill request for patient to get refills on voltaren gel (looks like paula gave her an rx a while ago for  "LUQ abdominal pain") Do you want me to give refills?

## 2010-09-18 NOTE — Telephone Encounter (Signed)
OK  To renew Voltaren  Gel, but not the oral tablets.

## 2010-09-19 NOTE — Cardiovascular Report (Signed)
NAMEMICHAELANN, Emily [name]              ACCOUNT NO.:  [id number]   MEDICAL RECORD NO.:  [id number]          PATIENT TYPE:  OIB   LOCATION:  2899                         FACILITY:  [facility]   PHYSICIAN:  Armanda Magic, M.D.     DATE OF BIRTH:  07/05/1940   DATE OF PROCEDURE:  DATE OF DISCHARGE:                              CARDIAC CATHETERIZATION   REFERRING PHYSICIAN:  Duke Salvia   This is a 70 year old black female with no previous cardiac history but a  history of hypertension and acid reflux who presents with atypical chest  pain. Her stress Cardiolite study did not show any inducible ischemia but  because of persistent chest pain and shortness of breath she now presents  for cardiac catheterization.   The patient is brought to cardiac catheterization laboratory in the fasting  nonsedated state. Informed consent was obtained. The patient was connected  to continuous heart rate and pulse oximetry monitoring and intermittent  blood pressure monitoring. The right groin was prepped and draped in a  sterile fashion. One percent Xylocaine was used for local anesthesia. Using  the modified Seldinger technique a 6-French sheath was placed in right  femoral artery. Under fluoroscopic guidance a 6-French JL-4 catheter was  placed in left coronary artery. Multiple cinefilms were taken in 30 degree  RAO and 40 degree LAO views. This catheter was then exchanged out over a  guidewire for 6-French JR-4 catheter which was placed under fluoroscopic  guidance in the right coronary artery. Multiple cinefilms were taken in 30  degree RAO and 40 degree LAO views. This catheter was then exchanged out  over a guidewire for 6-French angled pigtail catheter which was placed under  fluoroscopic guidance in left ventricular cavity. Left ventriculography was  performed in 30 degree RAO view using a total of 30 cc of contrast at 15 cc  per second. Catheter was then pulled back across the aortic valve with no  significant gradient noted. At the end of procedure all catheters and  sheaths were removed. Manual compression was performed until adequate  hemostasis was obtained. The patient transferred back to the room in stable  condition.   RESULTS:  1.  Left main coronary artery is widely patent and bifurcates into left      anterior descending artery and left circumflex artery. The left anterior      descending artery is widely patent throughout its course, the apex      giving rise to one diagonal branch which is widely patent, the left      circumflex is very large and widely patent throughout its course. It      gives rise to a high obtuse marginal 1 branch which is widely patent and      bifurcates into two daughter vessels both of which are widely patent. It      then gives rise to a second obtuse marginal branch which is widely      patent and a third obtuse marginal branch which is widely patent and      terminates in a posterior descending artery which is widely patent.  The right coronary is small and nondominant. This is a left dominant system.  The right coronary artery is patent throughout its course.   Left ventriculography has normal LV systolic function. Aortic pressure  169/81 mmHg, LV pressure 170/1 mmHg.   ASSESSMENT:  1.  Normal coronary arteries.  2.  Noncardiac chest pain.  3.  Normal LV function.  4.  Hypertension.   PLAN:  Discharge to home after IV fluid and bedrest, follow up with nurse  practitioner in 2 weeks for groin check.       TT/MEDQ  D:  08/13/2004  T:  08/13/2004  Job:  [id number]   cc:   Olga Coaster, N.P.

## 2010-09-19 NOTE — Procedures (Signed)
NAMEHAILEA, Emily [name]              ACCOUNT NO.:  [id number]   MEDICAL RECORD NO.:  [id number]          PATIENT TYPE:  OUT   LOCATION:  SLEEP CENTER                 [facility]ACILITY[facility]  [facility]   PHYSICIAN:  Clinton D. Maple Hudson, M.D. DATE OF BIRTH:  1941-04-30   DATE OF STUDY:  08/24/2005                              NOCTURNAL POLYSOMNOGRAM   REFERRING PHYSICIAN:  Dr. Ted Mcalpine.   INDICATION FOR STUDY:  Hypersomnia with sleep apnea.   EPWORTH SLEEPINESS SCORE:  13/24.   BMI of 32.4, weight 190 pounds.   HOME EDICATIONS:  Metformin, HCTZ, atenolol, Avapro, aspirin,  metoclopramide, Tylenol, Nexium, dicyclomine.   SLEEP ARCHITECTURE:  Total sleep time 345 minutes with sleep efficiency 80%.  Stage I was 16%, stage II 72% stages III and IV 2%, REM 9% of total sleep  time.  Sleep latency 31 minutes, REM latency 196 minutes, awake after sleep  onset 57 minutes, arousal index 22.8.  Bedtime medications for acid reflux  and irritable bowel were taken.   RESPIRATORY DATA:  Split study protocol.  Apnea/hypopnea index (AHI, RDI)  47.6 obstructive events per hour indicating moderately severe obstructive  sleep apnea/hypopnea syndrome before CPAP.  This included seven obstructive  apneas and 102 hypopneas before CPAP.  Events were not positional.  REM AHI  24 per hour.  CPAP was titrated to CWP, AHI zero per hour.  A medium  Respironics comfort full series 2 full face mask was used with heated  humidifier.   OXYGEN DATA:  Moderate snoring before CPAP with desaturation to 78%.  After  CPAP control oxygen saturation held 93-96% on room air.   CARDIAC DATA:  Normal sinus rhythm with occasional PVC.   MOVEMENT/PARASOMNIAS:  Occasional leg jerk, insignificant.  Bathroom x4.   IMPRESSION/RECOMMENDATIONS:  1.  Moderately severe obstructive sleep apnea/hypopnea syndrome, AHI 47.6      per hour with non positional events moderate snoring and oxygen      desaturation to 78%.  2.  Successful CPAP  titration to 10 mL of the feet, AHI 0 per hour.  A      medium Respironics comfort full series 2      full face mask was used with heated humidifier.  3.  Note sleep disturbance by bathroom x4.      Clinton D. Maple Hudson, M.D.  Diplomate, Biomedical engineer of Sleep Medicine  Electronically Signed     CDY/MEDQ  D:  08/30/2005 11:15:53  T:  08/31/2005 09:26:52  Job:  [id number]

## 2010-09-19 NOTE — Discharge Summary (Signed)
NAMESERENAH, Emily [name]              ACCOUNT NO.:  [id number]   MEDICAL RECORD NO.:  [id number]          [name] TYPE:  INP   LOCATION:  3730                         FACILITY:  [facility]   PHYSICIAN:  Peggye Pitt, M.D. DATE OF BIRTH:  [birth year]   DATE OF ADMISSION:  08/09/2005  DATE OF DISCHARGE:  08/10/2005                                 DISCHARGE SUMMARY   DISCHARGE DIAGNOSES:  1.  Atypical chest pain of unclear origin.  2.  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  3.  Newly diagnosed diabetes mellitus type 2.  4.  Gastroesophageal reflux disease.  5.  Hypertension.   DISCHARGE MEDICATIONS:  1.  Aspirin 325 mg p.o. daily.  2.  HCTZ 25 mg p.o. daily.  3.  Nexium 40 mg p.o. b.i.d.  4.  Reglan 10 mg one tablet t.i.d. after meals.  5.  Avapro 75 mg p.o. daily.  6.  Metformin 500 mg p.o. daily.   DISPOSITION:  Emily [name] is scheduled to have pulmonary function test on  Friday, April 13, and is also scheduled for a sleep schedule on Monday,  April 23.  Phone numbers have been provided to her.  Emily [name] will follow  up with her primary care physician at [facility] (phone [phone]).  At  time of discharge, it was impossible to arrange an appointment, but I have  emphasized to Emily [name] Emily importance of her calling in order to receive  Emily results of her studies and to follow up on her newly diagnosed diabetes.   HISTORY AND PHYSICAL:  For full details, please refer to Emily [name]'s  chart, but in brief, Emily [name] is a 70 year old African [name]  with past medical history significant for GERD, hypertension, presenting  with a one day history of atypical chest pain to Emily left breast and neck  with no aggravated or relieving factors.  Chest pain is free at time of  exam.  She has had history of similar chest pain in Emily past with Cardiolite  and catheter negative in April 2006.  Chest pain is associated with  shortness of breath.  For Emily past five years, Emily [name]  notes that she  has been noticing pre-shortness of breath with minimal exertion and no  peripheral edema.  She denies nausea, vomiting, diarrhea and chills.   PHYSICAL EXAMINATION:  VITAL SIGNS:  Temperature 98.1, blood pressure  141/76, heart rate 105, respirations 14, O2 saturation 94% on room air.  GENERAL APPEARANCE:  Emily [name] is awake, alert, oriented x3.  Not in acute  distress.  On heart exam, she is tachycardic with irregular rhythm and no  murmurs.  Respirations clear to auscultation bilaterally.  ABDOMEN:  Soft, nontender, nondistended with positive bowel sounds.  EXTREMITIES:  No edema with positive pulses.  NEUROLOGICAL:  Intact and nonfocal.   LABORATORY DATA:  Sodium 137, potassium 3.5, chloride 101, CO2 26, BUN 11,  creatinine 0.1, glucose 417.  WBC 10, hemoglobin 11.7, hematocrit 34.2,  platelets 265,000, MCV 75.  D. dimer was less than 0.22.  Point of care  markers:  First set:  Myoglobin  47, CK MB 1.2, troponin less than 0.05.   CONSULTATIONS:  None.   PROCEDURES:  Chest x-ray on April 8 consistent with no acute cardiopulmonary  disease, right perihilar nodule and a healed right sided rib fracture.  Also, on April 8, cervical spine x-ray showing mild degenerative disk  disease at C6-C7 and no evidence for acute trauma.  A CT angiogram of Emily  chest on April 8 which was negative for pulmonary embolus or other acute  findings and right middle lobe pulmonary nodule is stable.  Recommend repeat  CT scan in one year.   Echocardiogram that shows a left ventricular systolic function that was  normal with an EF of 55-65%.  No left ventricular regional wall motion  abnormality.  Left ventricular wall thickness at upper limits of normal.   HOSPITAL COURSE:  #1 - ATYPICAL CHEST PAIN WITH SHORTNESS OF BREATH WAS  NEGATIVE COMPLETE CARDIAC WORKUP IN 2006.  It is almost certain that her  problems are not cardiac.  Echo showed mildly increased thickness of a left  ventricle  which might be consistent with diastolic dysfunction which may be  Emily cause of her shortness of breath.  CT scan of Emily chest did not show any  fibrosis rule out reticular infiltrates that would be compatible with  infiltrative diseases.  Pain is reproducible upon palpation.  Also it is  likely that it is mostly skeletal in origin.  As stated before, chest CT was  negative for pulmonary embolus.  To be considered by a primary care  physician with Emily possibility of diastolic dysfunction, maybe a nitrite  such as Imdur will be a good medication for her to be on.  This was not  started during her hospitalization secondary to no borderline blood  pressures with systolic's in Emily 100's to 105's.   #2 - NEW RIGHT BUNDLE BRANCH BLOCK ON HER EKG'S.  First thought would be a  PE, but with a negative D. dimer and negative CT scan, this was ruled out.  Emily [name] has a history of snoring, so there is a possibility of sleep  apnea.  So, she was scheduled for pulmonary function test as well as a sleep  study as an outpatient, and she will have this done Emily week following  discharge.   #3 - NEWLY DIAGNOSED DIABETES.  Her CBG's, she was felt to have a glucose of  400 in Emily emergency room when she was first admitted.  In Emily hospital, she  was placed on Lantus and sliding scale insulin, but we believe secondary to  her obesity and her age that her problem is more insulin resistance than of  insulin production.  So, I will discharge her on metformin 500 mg daily,  since her creatinine is good at 0.6.  She will need close follow up as an  outpatient by her PCP to monitor her diabetes regimen, and she will need  diabetes education as well.   #4 - GERD.  She will continue her b.i.d. 40 mg of Nexium for that.   #5 - HYPERTENSION.  She is to continue her HCTZ and Avapro.  We have no  issues with hypertension during her hospitalization.  PHYSICAL EXAMINATION:  VITAL SIGNS:  Temperature 97.9, blood  pressure  105/70, heart rate 69, respirations 20, O2 saturation 94-98% on room air.   DISCHARGE LABORATORIES:  WBC 9.1, hemoglobin 10.5, platelets 210,000.  Sodium 138, potassium 3.7, chloride 105, CO2 25, BUN 8, creatinine 0.6,  platelets  163,000.  Total bilirubin 0.6, alk-phos 58, AST 18, ALT 20, pro  time 6.0, albumin 3.2, calcium 8.8.  Cardiac enzymes all were negative.  First set had a CK of 96, MB 1.4, troponin 0.01.  Second set, CK 77, MB 1.4,  troponin 0.01.  Third set, CK 96, MB 1.4, troponin 0.01.      Peggye Pitt, M.D.     EH/MEDQ  D:  08/10/2005  T:  08/11/2005  Job:  [id number]

## 2010-09-19 NOTE — Procedures (Signed)
[facility]                              EXERCISE TREADMILL   NAME:CARLOSSydell, Prowell                       MRN:          [id number]  DATE:07/28/2006                            DOB:          1940/11/01    PROCEDURE:  Exercise treadmill stress study.   INDICATIONS FOR PROCEDURE:  Ms. Flater is a 70 year old African American  woman with classic exertional chest pain and shortness of breath.  She  has a history of cardiac catheterization with normal coronary arteries  in 2006 but was referred for an exercise treadmill because of her  typical symptoms.   Ms. Latendresse exercised according to the Bruce protocol for 1 minute 24  seconds.  She achieved a work load of 3.5 METs and the test was stopped  due to shortness of breath.  Her peak blood pressure was 174/69 and  resting blood pressure was 137/87.  Her maximum heart rate was 112 which  was only 72% of her age predicted heart rate.   INTERPRETATION:  1. Poor exercise capacity.  2. No significant EKG changes at low level exercise.   CONCLUSION:  Indeterminate exercise treadmill study secondary to  inability to achieve adequate heart rate.  Recommend adenosine Myoview  study.     Veverly Fells. Excell Seltzer, MD  Electronically Signed    MDC/MedQ  DD: 07/28/2006  DT: 07/28/2006  Job #: [id number]

## 2010-09-19 NOTE — Assessment & Plan Note (Signed)
[facility]                         GASTROENTEROLOGY OFFICE NOTE   Emily [name], Emily [name]                       MRN:          [id number]  DATE:09/01/2006                            DOB:          1941/04/16    PROGRESS NOTE   Emily [name] is a 70 year old African-[name] with a history of  severe gastroesophageal reflux disease, esophageal strictures status  post Nissen fundoplication in 1998.  We have been following her since  1986, and she had upper endoscopies in 1986, 1990, 1991, 1993, 1995,  1997, 1999.  Last endoscopy was in May of 2007.  She is an ex-smoker x12  years.  Her weight has gradually increased from 170 pounds in 1996, to  currently 189 pounds.  She is complaining of chest pain.  This has been  evaluated by cardiology.  She had a cardiac catheterization and stress  test, which were negative.  She also has been followed by Dr. Sherene Sires, and  from his standpoint, he dose not feel it is a prominent pulmonary  problem.  She cannot walk from the bus to her house, which is down the  hill and slightly up the hill, without stopping.  She has tenderness and  pain in her mid sternum and across the breasts.  She denies dysphagia  and heartburn.   MEDICATIONS:  1. Nexium 40 mg twice a day.  For the past 4 weeks, however, she has      been only on 40 mg once a day because her Medicare insurance would      not cover b.i.d. dosage.  2. Hydrochlorothiazide 25 mg p.o. daily.  3. Atenolol 25 mg p.o. daily.  4. Diovan 1 daily.  5. She is on metoclopramide 10 mg p.o. nightly.  6. Metformin 500 mg daily.   PHYSICAL EXAM:  Blood pressure 112/64, pulse 88, and weight 187 pounds.  She was alert and oriented no distress.  Voice normal.  Oral cavity was normal.  NECK:  Supple.  No adenopathy.  The jugular veins were not distended.  LUNGS:  Clear to auscultation.  No wheezes or rales.  COR:  Quiet precordium.  Normal S1, normal S2.  Exam of the ribcage  showed large breasts and tenderness over the soft  tissues over the anterior ribcage below the clavicle.  There was no  mass.  ABDOMEN:  Exam was unremarkable.  RECTAL:  Soft, Hemoccult negative stool.   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  She had  Nissen fundoplication in 1998, and has done quite well on proton pump  inhibitors.  Last endoscopy was just 10 months ago and did not show any  evidence of Barrett's esophagus, but her chest pain could be partially  related to esophageal dysmotility.  Her shortness of breath also may be  attributed to chronic iron deficiency anemia with the most recent  hemoglobin being 10.5, hematocrit 32.6, and MCV of 75.  Her prior  hemoglobins were in the 11-12 g.   PLAN:  1. Check hemoglobin today, as well  as iron studies.  She may benefit      from iron infusion.  She has taken iron supplements in the past.  2. Barium swallow with cine esophagram to assess the esophageal      motility, esophageal spasm, and also the Nissen fundoplication.  3. We have discussed the possibility that her weight gain has caused      some breast enlargement and tenderness over the ribcage, which may      be impacting her chest pain.  On my exam she was quite tender in      the soft tissue of the breasts.  She has been up to date on her      mammograms, and she should continue her close followup.  I do not      think a repeat upper endoscopy is necessary at this time.     Hedwig Morton. Juanda Chance, MD  Electronically Signed    DMB/MedQ  DD: 09/01/2006  DT: 09/01/2006  Job #: [id number]

## 2010-09-19 NOTE — Assessment & Plan Note (Signed)
[facility]                             PULMONARY OFFICE NOTE   MALAIA, BUCHTA                       MRN:          [id number]  DATE:07/22/2006                            DOB:          07/20/1940    REASON FOR CONSULTATION:  Dyspnea.   HISTORY:  This is a 70 year old black female, remote smoker, who weighed  135 pounds in 1996 when she quit smoking with no respiratory complaints  then.  She developed dyspnea in the mid 90s with exertion, and was found  to have significant GERD for which she underwent Nissen fundoplication  with complete resolution of her dyspnea, and then within 4 years began  having almost the exact same pattern of dyspnea.  Subsequently, has been  placed on Nexium and metoclopramide with no apparent improvement.  In  fact, over the last several years, she says she is worse to the point  where she can only walk about a half block up a street before she  becomes short of breath.  What is different now, is that she also has  new onset chest discomfort with exertion.  This had previously resulted  in cardiac evaluation with a left heart catheterization by Dr. Armanda Magic on August 13, 2004 that showed normal LV function, pressures, and  normal coronary arteries.  However, the patient states she was not  having the same pain then as she does now, and Dr. Norris Cross note says  she has atypical chest pain, whereas presently she only has chest pain  reproducible with exertion, never at rest or sleeping.  She denies any  orthopnea, PND, fevers, chills, sweats, or leg swelling, or associated  hoarseness, cough, or dyspnea.   PAST MEDICAL HISTORY:  Significant for:  1. Nissen fundoplication in 1998.  2. She also has hypertension, but is not on ACE inhibitors.   ALLERGIES:  NONE KNOWN.   MEDICATIONS:  Nexium, hydrochlorothiazide, atenolol, Diovan, and  metoclopramide.  For full inventory of dosing, please see face  accommodated July 22, 2006, which was reviewed with the patient in  detail, and is correct as listed.   SOCIAL HISTORY:  She quit smoking in 1996 as noted above.  She works in  Audiological scientist.   FAMILY HISTORY:  Negative for respiratory diseases.  Positive for heart  disease in both mother and father in their 50s.   REVIEW OF SYSTEMS:  Taken in detail on the worksheet, and significant  only for the problems as outlined above.   PHYSICAL EXAMINATION:  This is a somewhat depressed-appearing,  ambulatory, obese, black female, in no acute distress.  She had stable vital signs.  HEENT:  Unremarkable.  Oropharynx clear.  Nasal turbinates and normal  ear canals clear bilaterally.  NECK:  Supple without cervical adenopathy or tenderness.  Trachea is  midline.  No thyromegaly.  LUNG FIELDS:  Were perfectly clear bilaterally to auscultation and  percussion with excellent air movement.  CHEST:  Midline chest heaviness with no associated nausea, vomiting,  diaphoresis or radiation of discomfort that is relieved at rest.  HEART:  Regular rhythm without  murmur, gallop, or rub.  ABDOMEN:  Soft and benign.  EXTREMITIES:  Warm without calf tenderness, cyanosis, clubbing, or  edema.  PFTs were reviewed from August 14, 2005, and are completely normal.  CT  scan of the chest was also reviewed from April 16, 2006, and is  normal except for a stable right middle lobe nodule that was present in  March 2006.   IMPRESSION:  I would describe discomfort presently as representing  classic angina pectoris if I did not already know that she had a normal  left heart catheterization for atypical pain in 2006.  Nevertheless,  because her chest discomfort is perfectly reproducible with exertion, I  think the burden is on [place] to see if we can reproduce it on a stress  test, and be sure it is not cardiac before referring her back to  Gastrointestinal for further evaluation and treatment for what is  probably the cause of the pain,  namely ongoing reflux.  Note that this  symptoms that she has now is identical to the one that she had before  requiring Nissen fundoplication, except for the addition of chest pain  over the last year or so.   Since her PFTs are perfectly normal, and her CT scan is also normal,  there is nothing to be gained by additional Pulmonary workup at this  point.  I might consider an echocardiogram, if not already done, to  complete the workup in looking for evidence of occult pulmonary  hypertension.     Charlaine Dalton. Sherene Sires, MD, [facility]  Electronically Signed    MBW/MedQ  DD: 07/22/2006  DT: 07/22/2006  Job #: [id number]   cc:   Dineen Kid. Reche Dixon, M.D.

## 2010-09-19 NOTE — Letter (Signed)
Sep 08, 2006    Adolph Pollack, M.D.  [address]  [place], [place] [address]   RE:  Emily [name], Emily [name]  MRN:  [id number]  /  DOB:  Sep 26, 1940   Dear Tawanna Cooler,   I would appreciate your opinion on Emily [name].  As you remember,  you did a Nissen fundoplication for severe gastroesophageal reflux  disease and recurrent esophageal strictures in 1998.  She did well for  about a year and then started having some shortness of breath on  exertion.  I have been following her with period upper endoscopies.  The  last one was in June, 2007, which according to my records shows that her  Nissen was present and functioning.  She has maintained PPI's, Prilosec  40 mg twice daily, and Reglan 10 mg at bedtime.  She is currently on  Nexium 40 mg twice daily.   Emily [name], who is following her for her dyspnea, feels that this her  dyspnea may be due to recurrent gastroesophageal reflux.  Her pulmonary  function tests seem to be normal, and her pulmonary CT scan is also  normal.  Also, her cardiac workup has been negative.  When I saw her in  the office the other day, we did a barium swallow to assess her  esophageal motility with regards to her chest pain and reflux symptoms,  and it showed that she does have a marked gastroesophageal reflux.  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  There was also a small paraesophageal component to her  hernia.   I am not sure if her Nissen fundoplication got undone or if it was  intentionally made loose to prevent any gas-bloat syndrome, or  dysphagia.  Emily [name] is interested in your opinion  regarding this matter and also is asking whether she may not need repeat  Nissen fundoplication.  I am faxing her records as well as [facility]  records to you for information.   Thank you very much for your opinion.    Sincerely,      Emily [name]. Juanda Chance, MD  Electronically Signed    DMB/MedQ  DD: 09/08/2006   DT: 09/09/2006  Job #: [id number]   CC:    Charlaine Dalton. Sherene Sires, MD, FCCP

## 2010-09-22 ENCOUNTER — Other Ambulatory Visit: Payer: Self-pay | Admitting: Internal Medicine

## 2010-09-29 IMAGING — CR DG UGI W/ KUB
2 series · 2 of 2 positions shown · non-contrast
Comparison: None.

CLINICAL DATA: History of partial gastric outlet obstruction.
History of gastrojejunostomy.

UPPER GI SERIES WITH KUB
TECHNIQUE: Thin barium was injected through the enteric tube into
the body of the stomach.
 Fluoroscopy Time: 4.4 minutes

[view not recorded (1 of 2)]
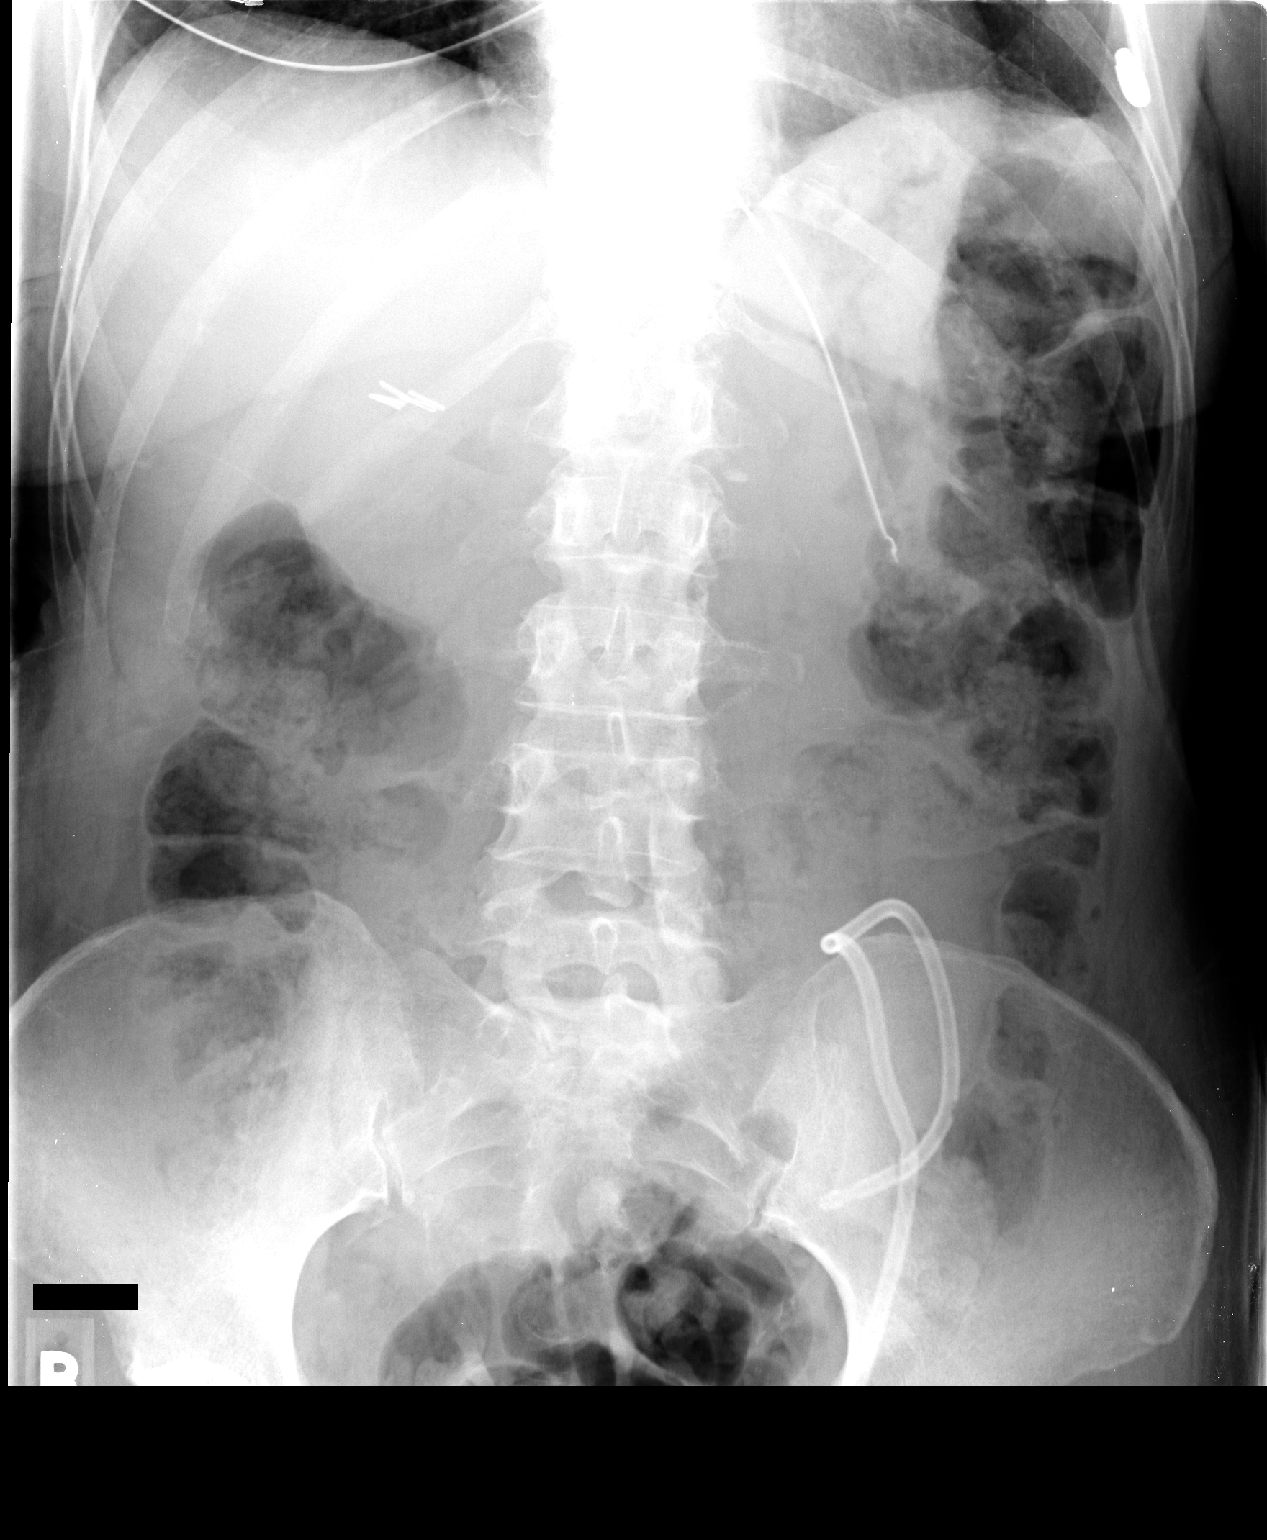

[view not recorded (2 of 2)]
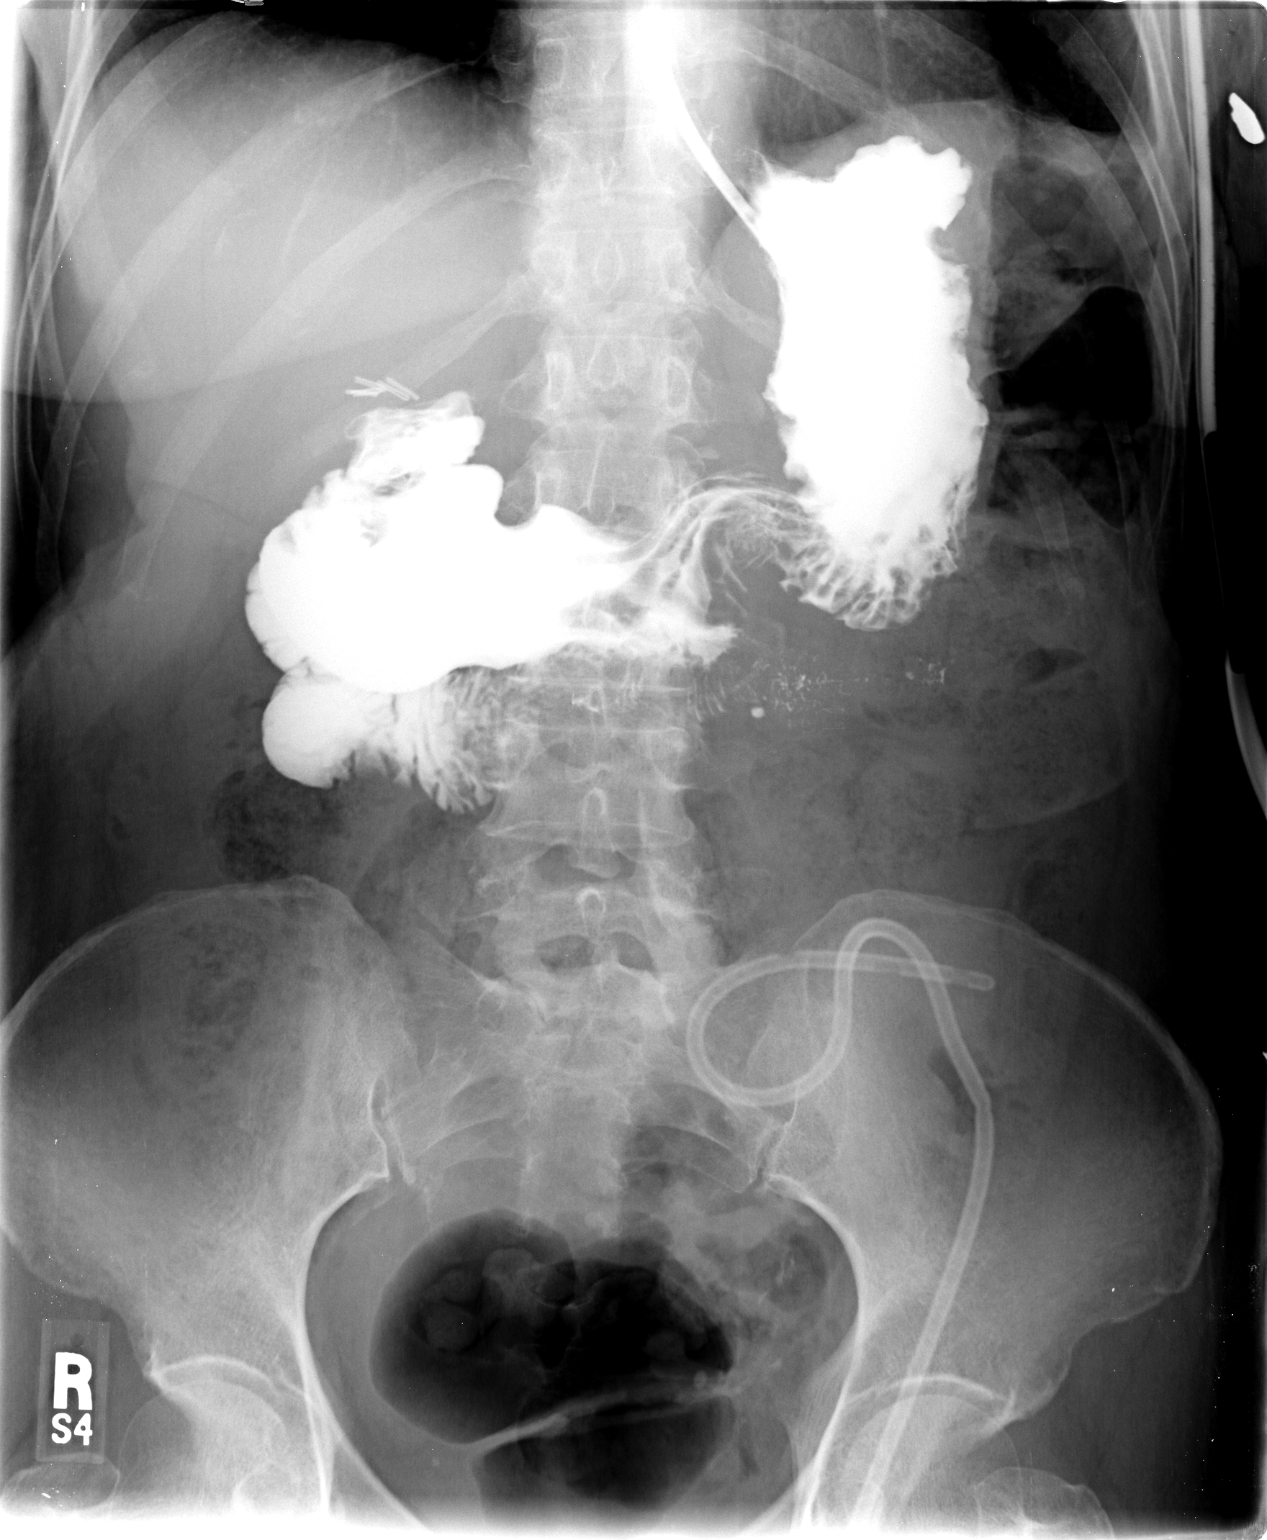

[2 of 2 positions shown; findings below may reference images not displayed]

FINDINGS: Thin barium was injected through the enteric tube into
the body of the stomach.

 Initially there was no emptying of contrast from the stomach.
Contrast passed into the distal stomach which shows deformity and
passed into the duodenal bulb which is also appears deformed.

When the patient was placed into the right side down decubitus
position contrast passed into the second and third portions of the
duodenum which were dilated.

 Contrast was visualized which passed into the gastrojejunal
anastomosis minimally and into the jejunum .  .

The patient also has a percutaneous jejunostomy tube in place.
IMPRESSION: The  gastrojejunal anastomosis is patent but only a small amount of
contrast passes from the stomach through the anastomosis into the
jejunum.

 Despite changes in position most of the contrast passes from the
distal stomach into the dilated duodenum.

A delayed abdominal radiographic image obtained approximately 28
minutes after beginning the procedure shows contrast within the
dilated duodenum but only very minimal contrast within the area of
the gastrojejunal anastomosis.

There is deformity of the distal stomach and proximal duodenum.
The duodenum is dilated.

## 2010-09-30 IMAGING — CR DG ABD PORTABLE 1V
1 series · 1 of 1 positions shown · non-contrast
Comparison: 05/09/2009

CLINICAL DATA: Check progression of contrast through bowel.

ABDOMEN - 1 VIEW

[view not recorded]
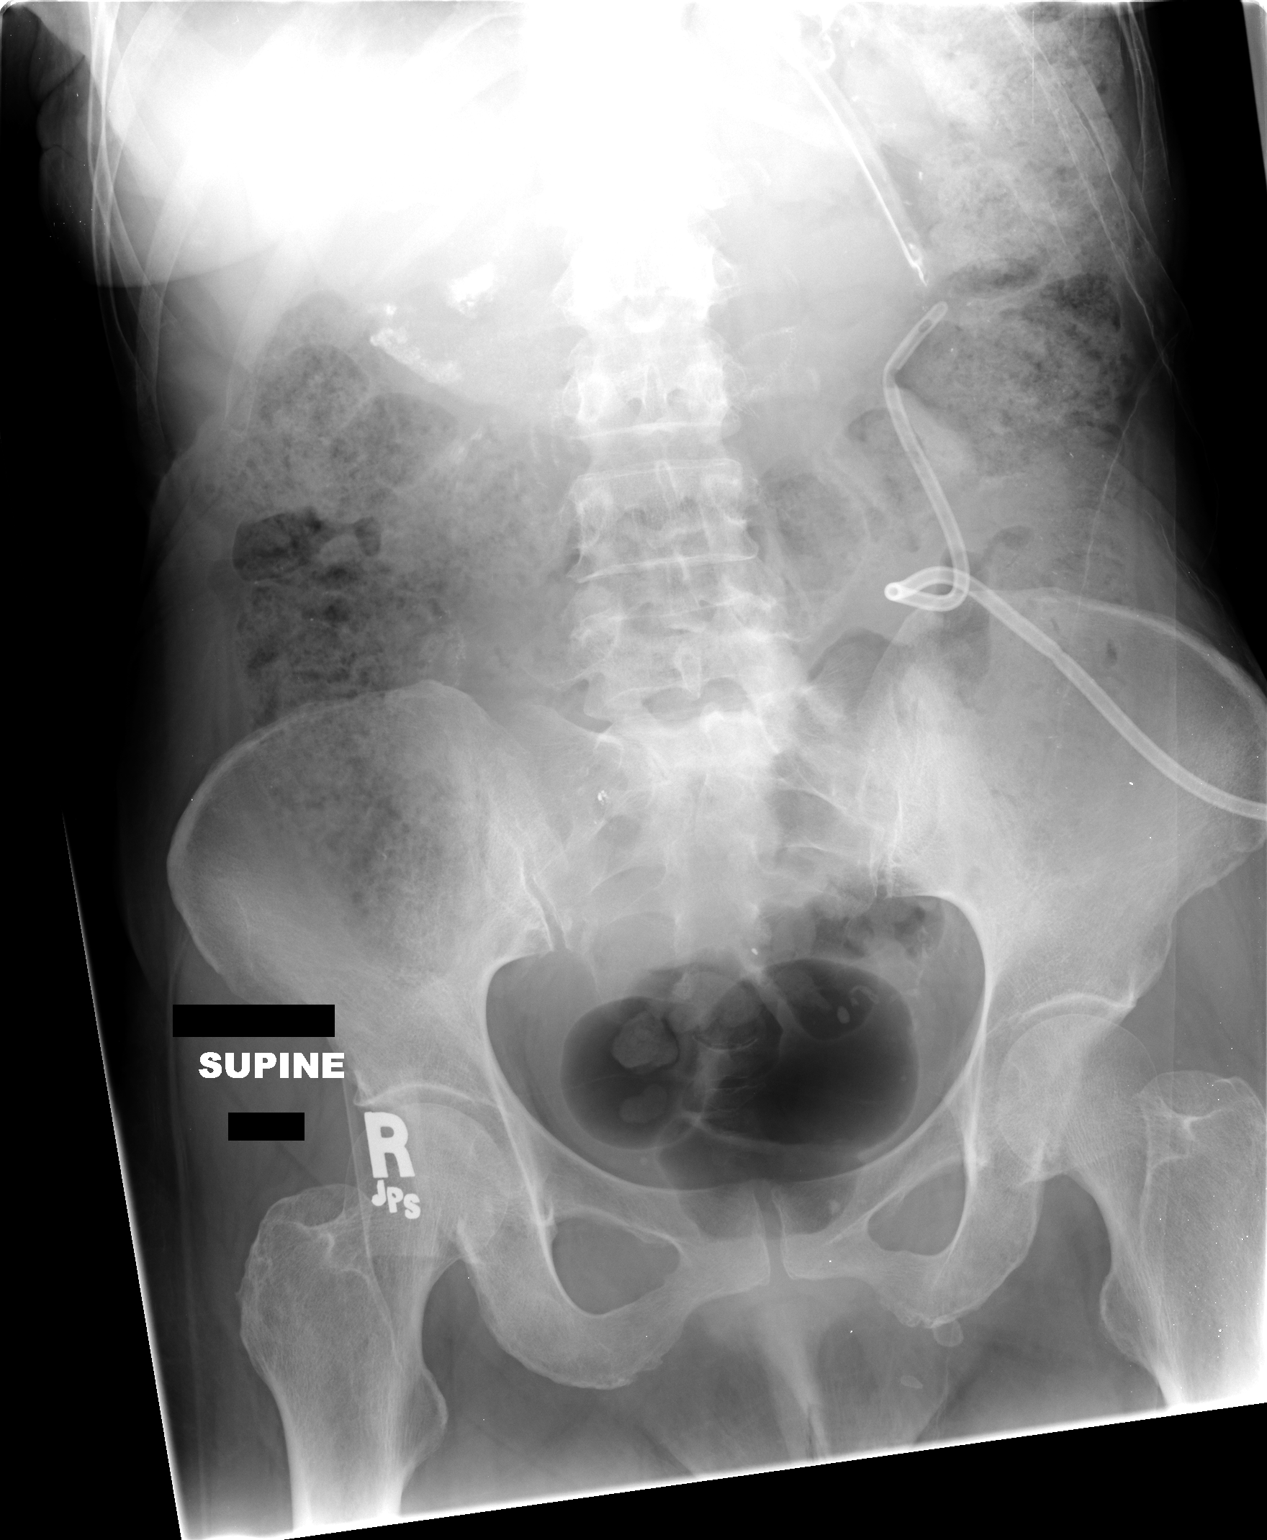

[1 of 1 positions shown; findings below may reference images not displayed]

FINDINGS: NG tube is noted in the stomach.  Small amount of
contrast noted in the gastric fundus and likely duodenum.  Large
amount stool throughout the colon.  Possible admixture of contrast
with stool in the distal transverse colon and splenic flexure
region.  Prior cholecystectomy.  Left lower quadrant drain is in
place.
IMPRESSION: Small amount of contrast noted in the stomach and proximal
duodenum.  Possible small amount of contrast admixed with moderate
stool throughout the colon.

## 2010-10-01 IMAGING — CR DG CHEST 1V PORT
1 series · 1 of 1 positions shown · non-contrast
Comparison: Chest x-ray of 10/31/2008

CLINICAL DATA: Gastric outlet obstruction, PICC line placement

PORTABLE CHEST - 1 VIEW

[view not recorded]
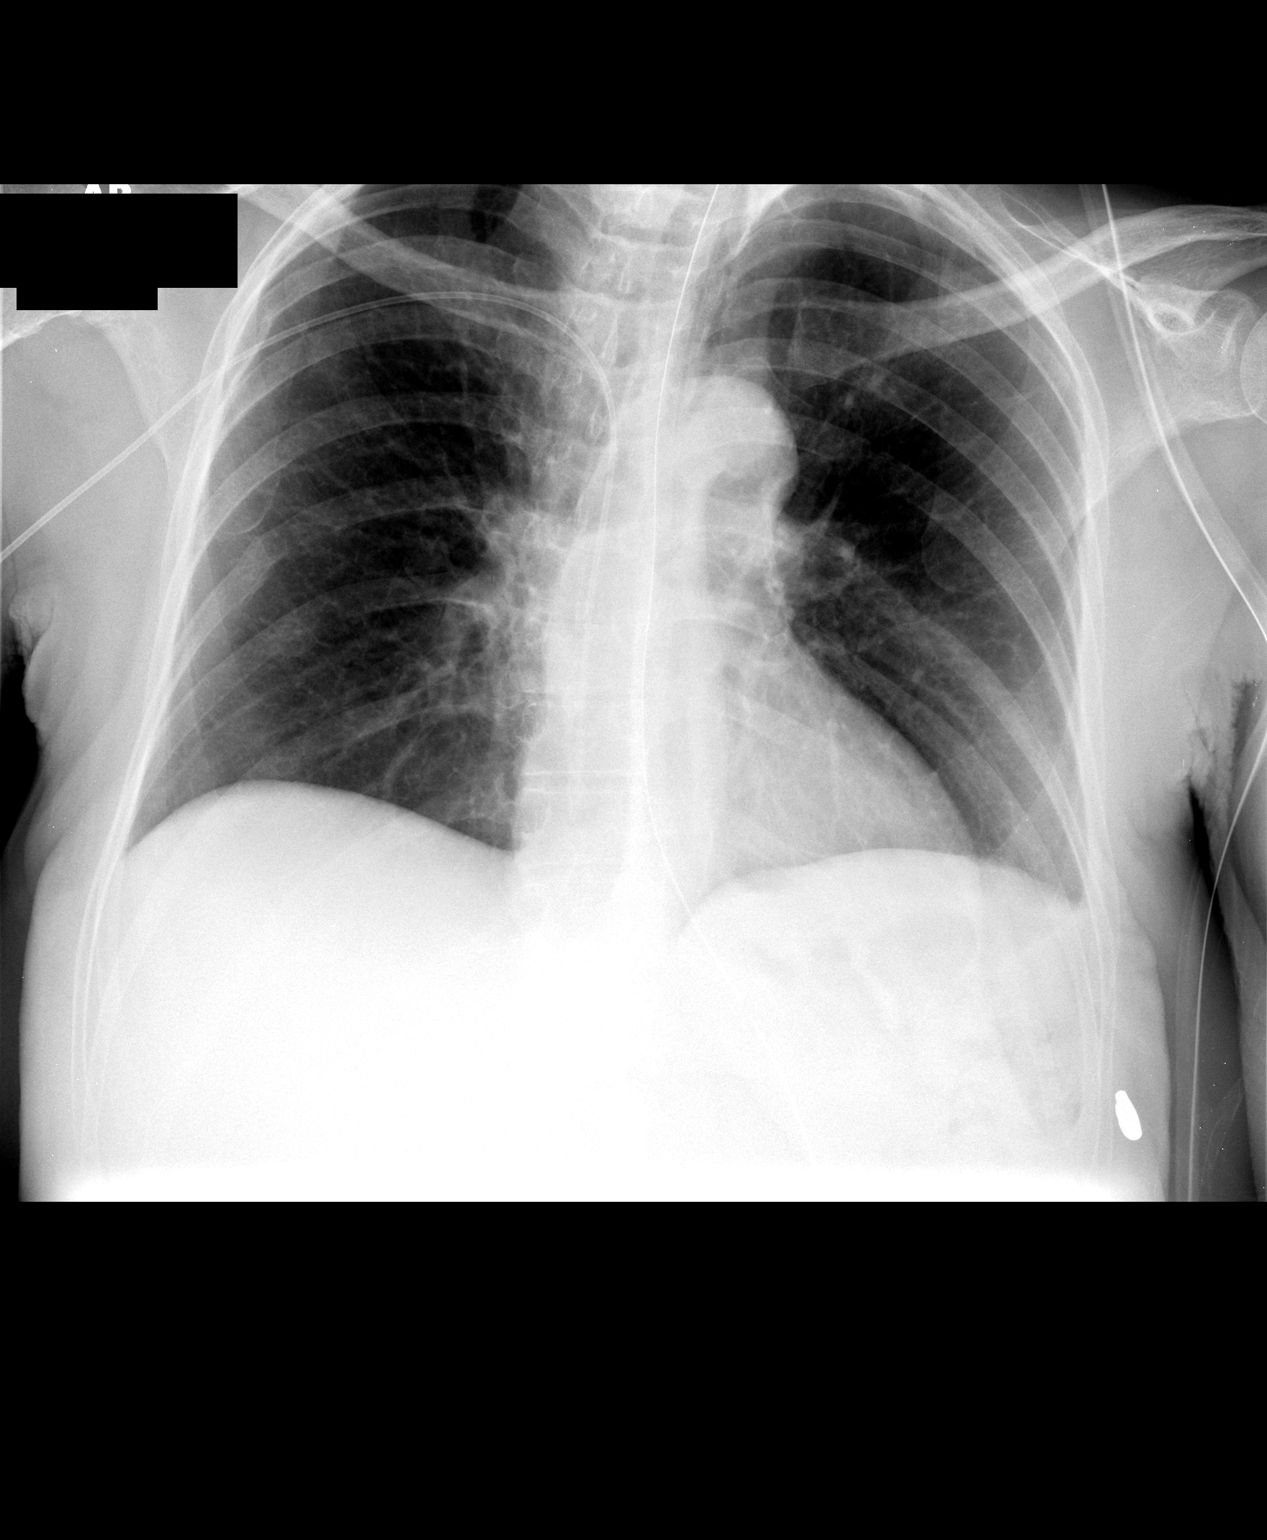

[1 of 1 positions shown; findings below may reference images not displayed]

FINDINGS: A right upper extremity PICC line is present.  The tip is
seen.  The SVC - RA junction.  No pneumothorax is noted.  The lungs
are clear. An NG tube is present. Heart size is stable.
IMPRESSION: Right upper extremity PICC line tip at SVC/RA junction.  No
pneumothorax.

## 2010-10-02 ENCOUNTER — Other Ambulatory Visit: Payer: Self-pay | Admitting: Internal Medicine

## 2010-10-03 ENCOUNTER — Other Ambulatory Visit: Payer: Self-pay | Admitting: Internal Medicine

## 2010-10-06 ENCOUNTER — Encounter: Payer: Self-pay | Admitting: Internal Medicine

## 2010-10-06 ENCOUNTER — Other Ambulatory Visit (INDEPENDENT_AMBULATORY_CARE_PROVIDER_SITE_OTHER): Payer: Medicare Other

## 2010-10-06 ENCOUNTER — Ambulatory Visit (INDEPENDENT_AMBULATORY_CARE_PROVIDER_SITE_OTHER): Payer: Medicare Other | Admitting: Internal Medicine

## 2010-10-06 DIAGNOSIS — T8189XA Other complications of procedures, not elsewhere classified, initial encounter: Secondary | ICD-10-CM

## 2010-10-06 DIAGNOSIS — K3184 Gastroparesis: Secondary | ICD-10-CM

## 2010-10-06 DIAGNOSIS — R6889 Other general symptoms and signs: Secondary | ICD-10-CM

## 2010-10-06 DIAGNOSIS — R633 Feeding difficulties, unspecified: Secondary | ICD-10-CM

## 2010-10-06 DIAGNOSIS — R197 Diarrhea, unspecified: Secondary | ICD-10-CM

## 2010-10-06 LAB — VITAMIN B12: Vitamin B-12: 313 pg/mL (ref 211–911)

## 2010-10-06 LAB — CBC WITH DIFFERENTIAL/PLATELET
Basophils Absolute: 0.1 10*3/uL (ref 0.0–0.1)
Basophils Relative: 0.4 % (ref 0.0–3.0)
Eosinophils Absolute: 0.1 10*3/uL (ref 0.0–0.7)
Eosinophils Relative: 1 % (ref 0.0–5.0)
HCT: 39.1 % (ref 36.0–46.0)
Hemoglobin: 12.4 g/dL (ref 12.0–15.0)
Lymphocytes Relative: 12.3 % (ref 12.0–46.0)
Lymphs Abs: 1.7 10*3/uL (ref 0.7–4.0)
MCHC: 31.6 g/dL (ref 30.0–36.0)
MCV: 78.7 fl (ref 78.0–100.0)
Monocytes Absolute: 0.8 10*3/uL (ref 0.1–1.0)
Monocytes Relative: 5.5 % (ref 3.0–12.0)
Neutro Abs: 11 10*3/uL — ABNORMAL HIGH (ref 1.4–7.7)
Neutrophils Relative %: 80.8 % — ABNORMAL HIGH (ref 43.0–77.0)
Platelets: 340 10*3/uL (ref 150.0–400.0)
RBC: 4.96 Mil/uL (ref 3.87–5.11)
RDW: 15.9 % — ABNORMAL HIGH (ref 11.5–14.6)
WBC: 13.6 10*3/uL — ABNORMAL HIGH (ref 4.5–10.5)

## 2010-10-06 LAB — COMPREHENSIVE METABOLIC PANEL
ALT: 22 U/L (ref 0–35)
AST: 16 U/L (ref 0–37)
Albumin: 4.2 g/dL (ref 3.5–5.2)
Alkaline Phosphatase: 65 U/L (ref 39–117)
BUN: 16 mg/dL (ref 6–23)
CO2: 24 mEq/L (ref 19–32)
Calcium: 9.5 mg/dL (ref 8.4–10.5)
Chloride: 108 mEq/L (ref 96–112)
Creatinine, Ser: 0.6 mg/dL (ref 0.4–1.2)
GFR: 132.06 mL/min (ref >60.00)
Glucose, Bld: 128 mg/dL — ABNORMAL HIGH (ref 70–99)
Potassium: 4 mEq/L (ref 3.5–5.1)
Sodium: 142 mEq/L (ref 135–145)
Total Bilirubin: 0.4 mg/dL (ref 0.3–1.2)
Total Protein: 7.4 g/dL (ref 6.0–8.3)

## 2010-10-06 LAB — IBC PANEL
Iron: 72 ug/dL (ref 42–145)
Saturation Ratios: 16.7 % — ABNORMAL LOW (ref 20.0–50.0)
Transferrin: 307.7 mg/dL (ref 212.0–360.0)

## 2010-10-06 LAB — FOLATE: Folate: 24.6 ng/mL (ref >5.9)

## 2010-10-06 LAB — FERRITIN: Ferritin: 419.1 ng/mL — ABNORMAL HIGH (ref 10.0–291.0)

## 2010-10-06 MED ORDER — PROMETHAZINE HCL 25 MG RE SUPP: 25.0000 mg | Freq: Four times a day (QID) | RECTAL | Status: DC | PRN

## 2010-10-06 MED ORDER — HYDROCODONE-ACETAMINOPHEN 7.5-325 MG/15ML PO SOLN: ORAL | Status: DC

## 2010-10-06 MED ORDER — CALCIUM CARBONATE ANTACID 500 MG PO CHEW: CHEWABLE_TABLET | ORAL | Status: DC

## 2010-10-06 MED ORDER — LOPERAMIDE HCL 2 MG PO TABS: ORAL_TABLET | ORAL | Status: DC

## 2010-10-06 MED ORDER — PROMETHAZINE HCL 6.25 MG/5ML PO SYRP: ORAL_SOLUTION | ORAL | Status: DC
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
## 2010-10-06 NOTE — Patient Instructions (Addendum)
Your physician has requested that you go to the basement for the following lab work before leaving today: CBC, CMET, IBC Panel, B12 level We have sent a prescription of imodium to your pharmacy. You should take 2 tablets by mouth every night as needed. We have sent phenergan to your pharmacy. CC: Dr [name], Dr [name], Dr Andrey Campanile

## 2010-10-07 ENCOUNTER — Telehealth: Payer: Self-pay | Admitting: *Deleted

## 2010-10-07 DIAGNOSIS — E538 Deficiency of other specified B group vitamins: Secondary | ICD-10-CM

## 2010-10-07 MED ORDER — CEPHALEXIN 250 MG/5ML PO SUSR: ORAL | Status: DC

## 2010-10-07 NOTE — Telephone Encounter (Signed)
Message copied by Daphine Deutscher on Tue Oct 07, 2010  2:01 PM ------      Message from: Hart Carwin      Created: Tue Oct 07, 2010  1:02 PM       Please call pt, needs to take B12 injections monthly 1000ug IM starting now, recheck B12 in 6 months, also start Keflex elixir 250mg ,tablesp, #10 0z, 1 tablesp po qid x 5 days, all other blood tests are OK,

## 2010-10-07 NOTE — Telephone Encounter (Signed)
Spoke with patient and gave her the results. She is taking Vitamin B 12 injections at her PCP office. Last one was 2 weeks ago. Keflex rx sent to pharmacy. Labs in [facility] to repeat in 6 months. Note to remind patient.

## 2010-10-08 ENCOUNTER — Other Ambulatory Visit: Payer: Self-pay | Admitting: Physician Assistant

## 2010-10-09 IMAGING — CR DG ABD PORTABLE 1V
1 series · 1 of 1 positions shown · non-contrast
Comparison: CT 05/12/2009.

CLINICAL DATA: 68-year-old female with replacement of nasogastric
tube.

ABDOMEN - 1 VIEW

[view not recorded]
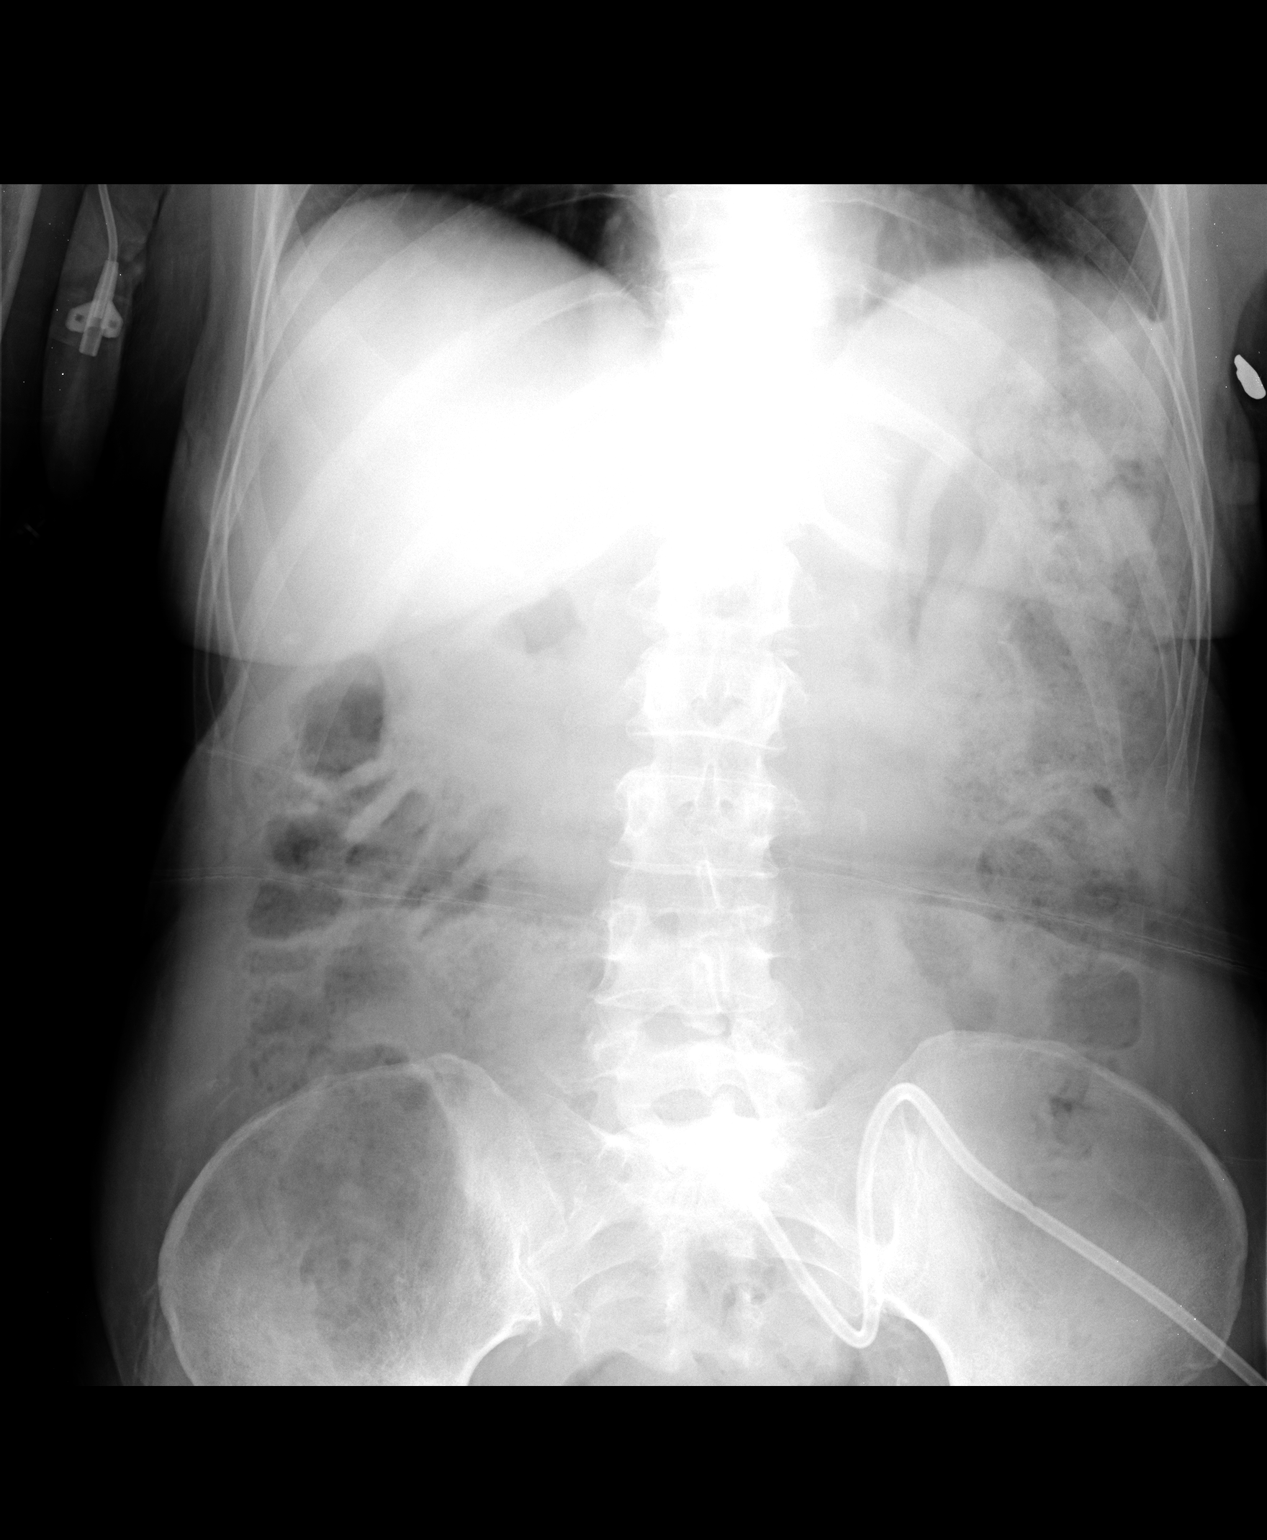

[1 of 1 positions shown; findings below may reference images not displayed]

FINDINGS: Portable supine view at 1229 hours.  Nasogastric type
tubing courses along the left aspect of the spine.  Stable PICC
line tubing projecting at the inferior right atrium.  The
nasogastric tube terminates in the distal thoracic esophagus, with
the side hole in the mid thoracic esophagus.  Stable percutaneous
jejunal tube. Nonobstructed bowel gas pattern.
IMPRESSION: 1.  Nasogastric type tube terminates in the distal thoracic
esophagus.  This should be advanced at least 10 cm to place the
side-hole within the stomach.
2.  Right PICC line tip projects at the lower right atrium/inferior
cavoatrial junction.
3.  Stable left lower quadrant jejunal tube.

## 2010-10-09 IMAGING — CR DG ABD PORTABLE 1V
1 series · 1 of 1 positions shown · non-contrast
Comparison: 05/19/2009

CLINICAL DATA: Gastric outlet obstruction and status post
nasogastric tube placement.

ABDOMEN - 1 VIEW

[view not recorded]
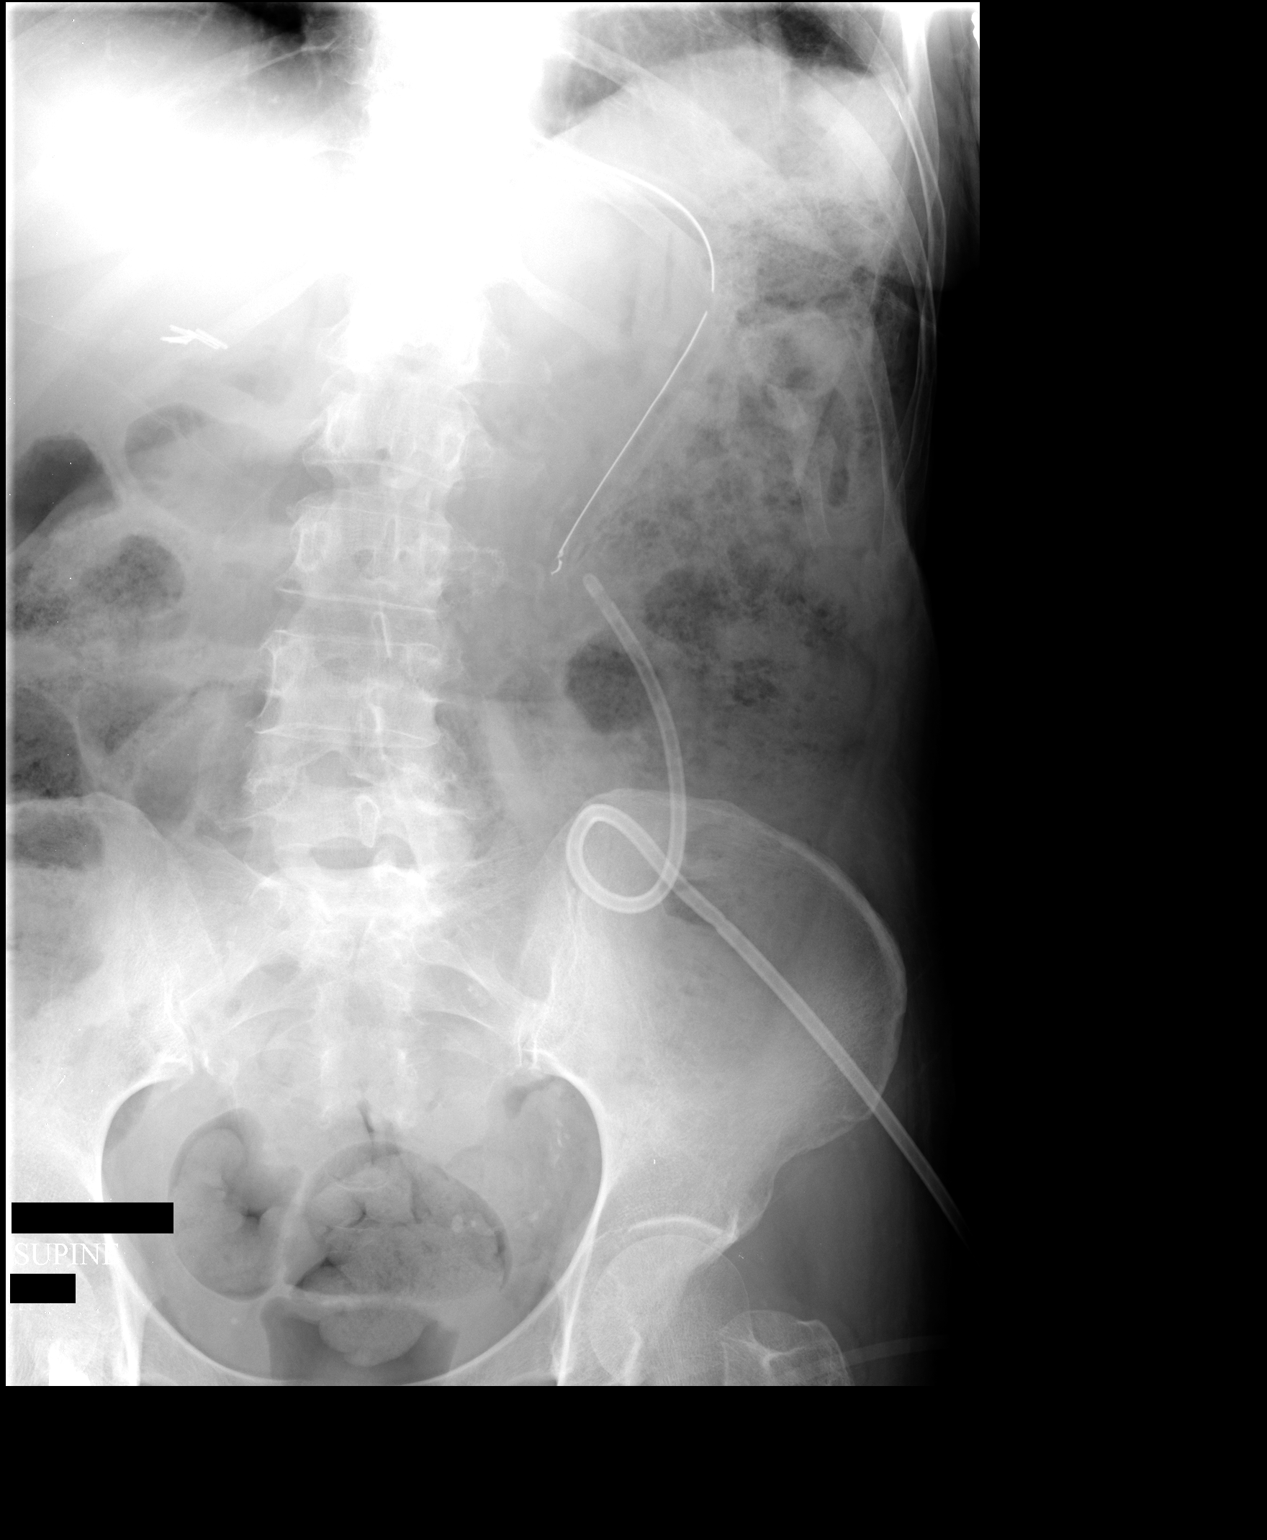

[1 of 1 positions shown; findings below may reference images not displayed]

FINDINGS: Nasogastric tube is in place which extends into the mid
to distal stomach.  Jejunostomy again noted.  No evidence of overt
bowel obstruction.
IMPRESSION: Nasogastric tube extends into the mid to distal stomach.

## 2010-10-10 ENCOUNTER — Ambulatory Visit (HOSPITAL_COMMUNITY)
Admission: RE | Admit: 2010-10-10 | Discharge: 2010-10-10 | Disposition: A | Discharge status: Home / Self Care | Payer: Medicare Other | Source: Ambulatory Visit | Attending: Gastroenterology | Admitting: Gastroenterology

## 2010-10-10 ENCOUNTER — Telehealth: Payer: Self-pay | Admitting: Internal Medicine

## 2010-10-10 DIAGNOSIS — K3184 Gastroparesis: Secondary | ICD-10-CM

## 2010-10-10 DIAGNOSIS — K9413 Enterostomy malfunction: Secondary | ICD-10-CM | POA: Insufficient documentation

## 2010-10-10 DIAGNOSIS — Y833 Surgical operation with formation of external stoma as the cause of abnormal reaction of the patient, or of later complication, without mention of misadventure at the time of the procedure: Secondary | ICD-10-CM | POA: Insufficient documentation

## 2010-10-10 DIAGNOSIS — K9403 Colostomy malfunction: Secondary | ICD-10-CM | POA: Insufficient documentation

## 2010-10-10 MED ORDER — IOHEXOL 300 MG/ML  SOLN
50.0000 mL | Freq: Once | INTRAMUSCULAR | Status: AC | PRN
  Administered 2010-10-10: 30 mL via INTRATHECAL

## 2010-10-10 NOTE — Telephone Encounter (Signed)
I AGREE.

## 2010-10-10 NOTE — Telephone Encounter (Signed)
Per Dr. Jarold Motto, have patient go to IR for repair or replacement of J tube. Per [facility] have patient come now. Patient notified.

## 2010-10-10 NOTE — Telephone Encounter (Signed)
Patient calling to report her J tube is stopped up. She was able to flush it x2 with warm water. Then it would not flush at all. Patient states it looks like it has a hole in the side of it. Patient had the J tube replaced in IR last time but states Dr. Juanda Chance told her to call the office first if something happened to it again. Please, advise.

## 2010-10-11 IMAGING — CR DG UGI W/ KUB
1 series · 1 of 1 positions shown · non-contrast
Comparison: 05/09/2009 study

CLINICAL DATA: Partial gastric outlet obstruction.  Status post
gastrojejunal anastomosis.

UPPER GI SERIES WITH KUB
TECHNIQUE: Thin barium sulfate was administered through enteric
tube in place and imaging of the stomach and proximal small bowel
was performed.
Fluoroscopy Time: 2.24 minutes

[view not recorded]
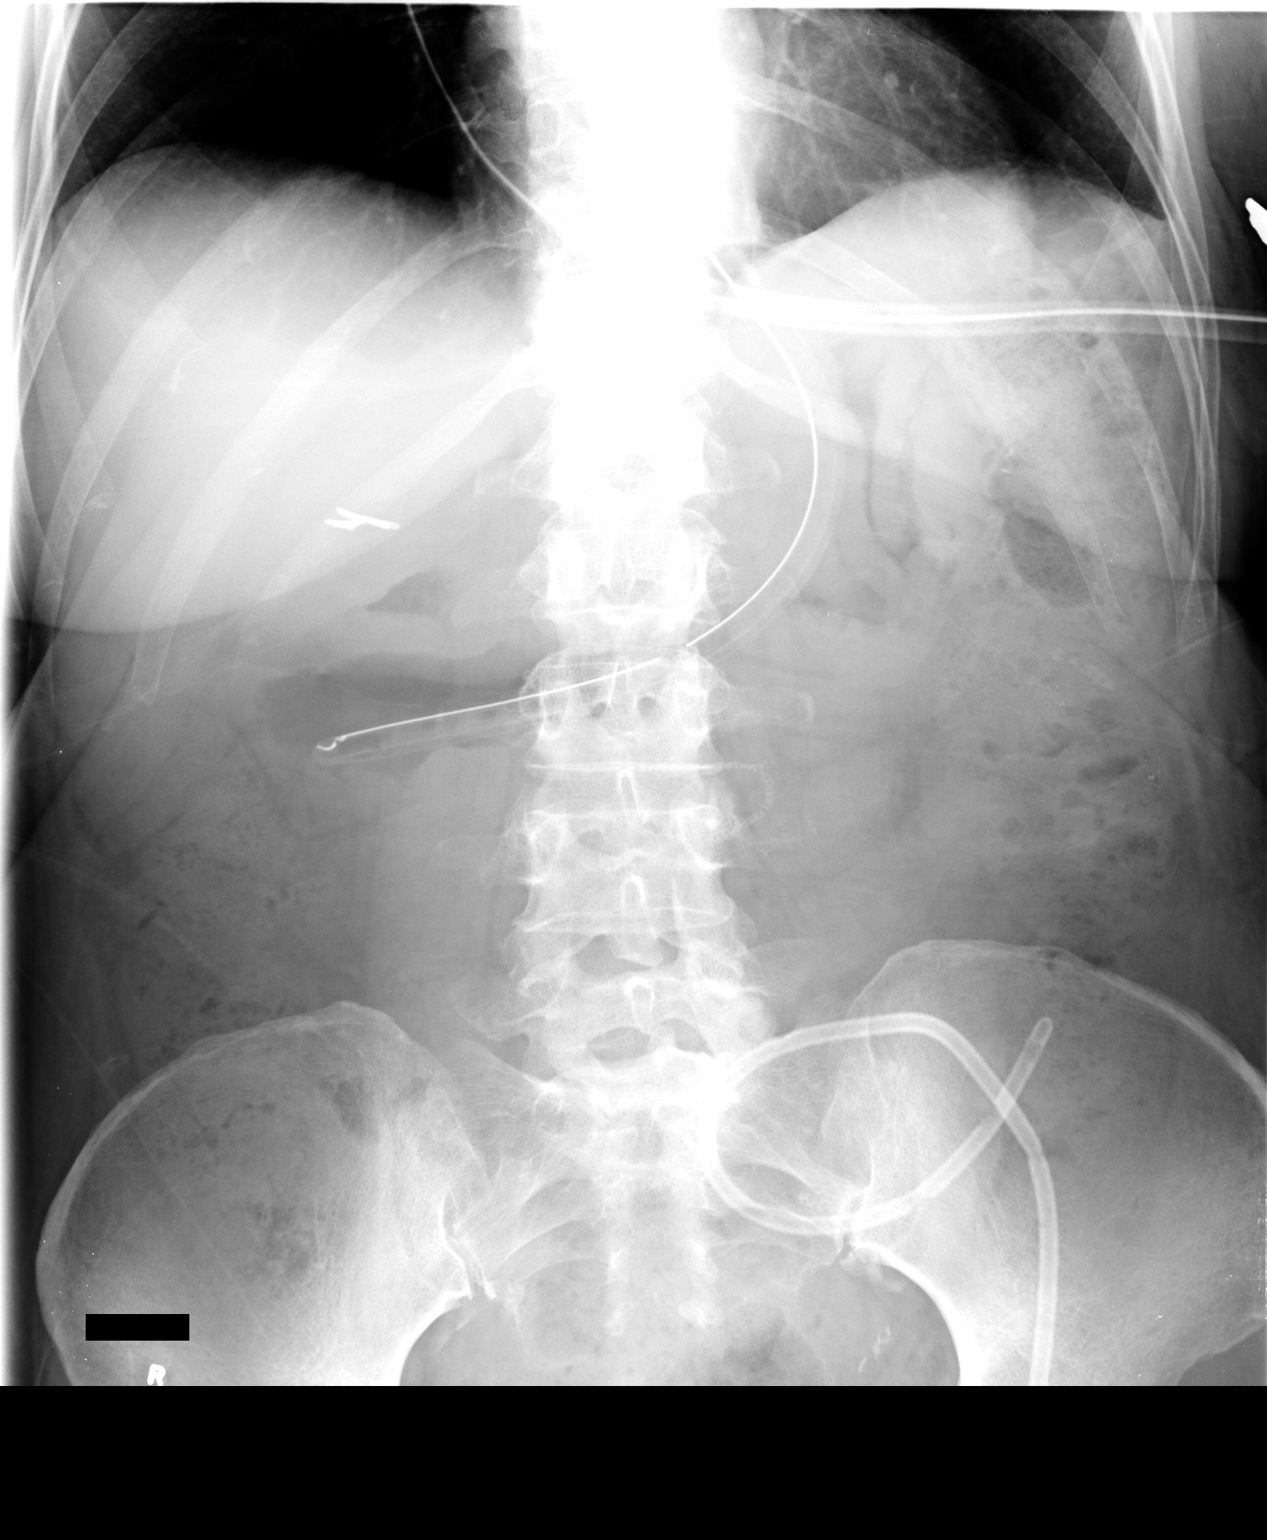

[1 of 1 positions shown; findings below may reference images not displayed]

FINDINGS: When the distal stomach was filled with contrast some
contrast passed through the pyloric channel into the duodenum.

The gastrojejunal anastomosis was best visualized in the lateral
projection.  The anastomosis is demonstrated to be patent.  However
there is slow emptying of contrast through it into the proximal
jejunum.  The jejunum just distal to the anastomosis is slightly
dilated.  More distal to this the jejunum appears to be normal
caliber.
IMPRESSION: Gastrojejunal anastomosis appears to be patent.However
there is slow emptying of contrast through it into the proximal
jejunum.  The jejunum just distal to the anastomosis is slightly
dilated.  More distal to this the jejunum appears to be normal
caliber. Some contrast passes through the distal stomach into the
duodenum.

## 2010-10-17 IMAGING — CT CT HEAD W/O CM
1 of 2 series · 13 of 30 positions shown, 17 images · non-contrast
Comparison: None

CLINICAL DATA: Nausea.  Headache.  Fatigue.  Hypertension and
diabetes.

CT HEAD WITHOUT CONTRAST
TECHNIQUE: Contiguous axial images were obtained from the base of
the skull through the vertex without contrast.

[Series 2: brain · axial · 0.47mm/px · z∈[+148,+287]mm · 13 of 32 slices shown, 17 images]
[im 3/32  brain]
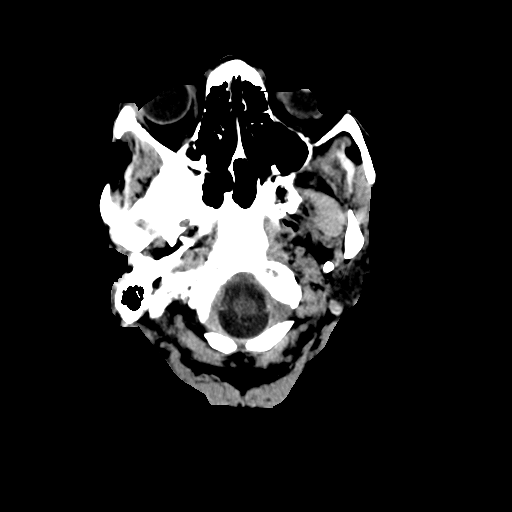
[im 3/32  bone]
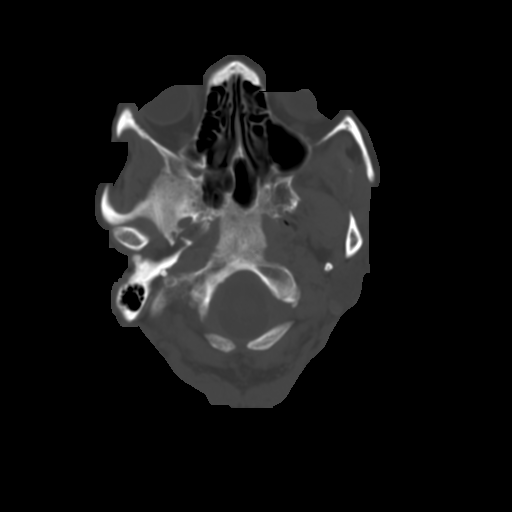
[im 5/32  brain]
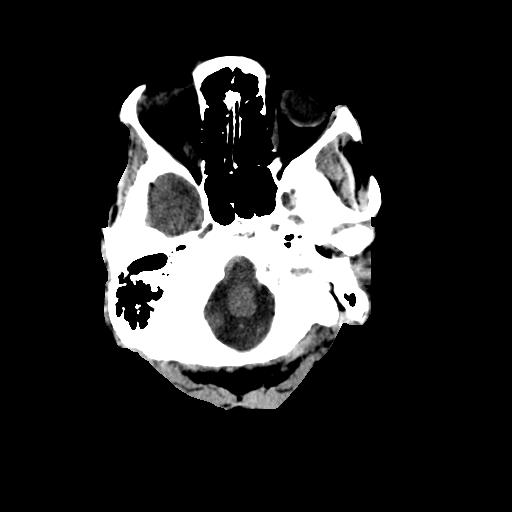
[im 7/32  brain]
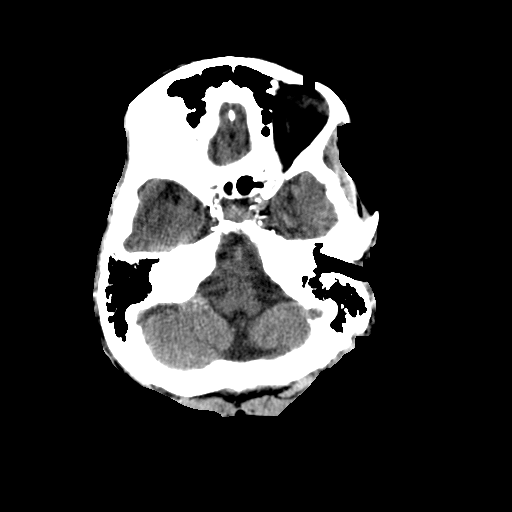
[im 9/32  brain]
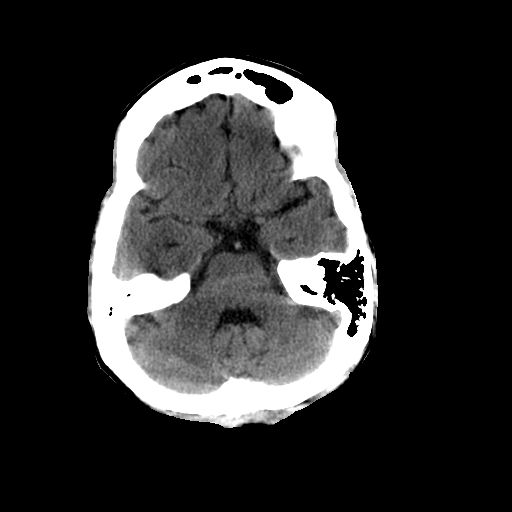
[im 12/32  brain]
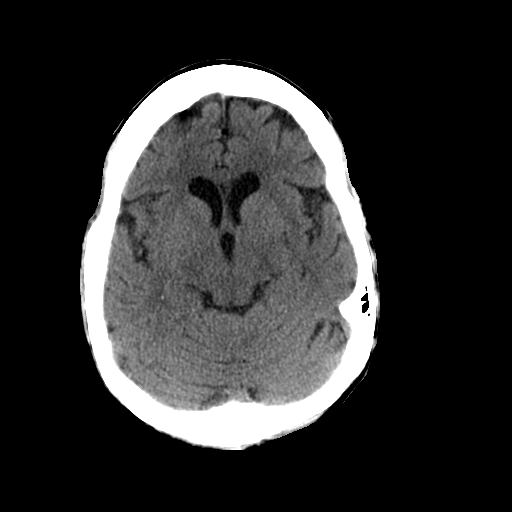
[im 12/32  bone]
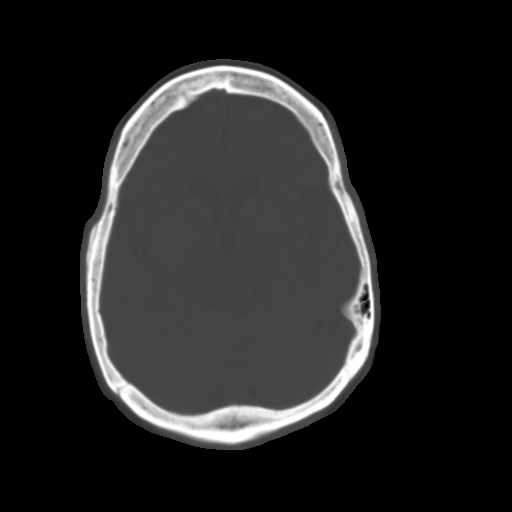
[im 14/32  brain]
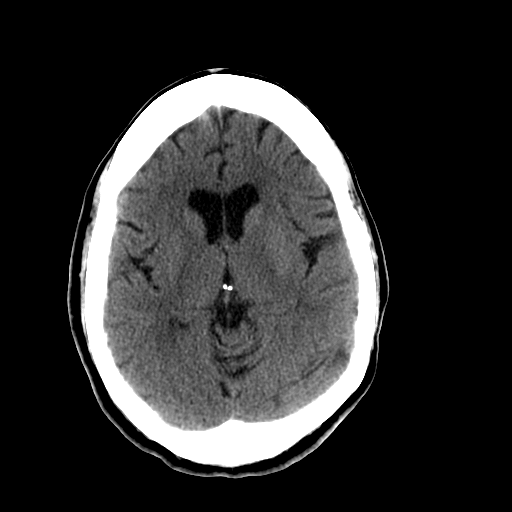
[im 16/32  brain]
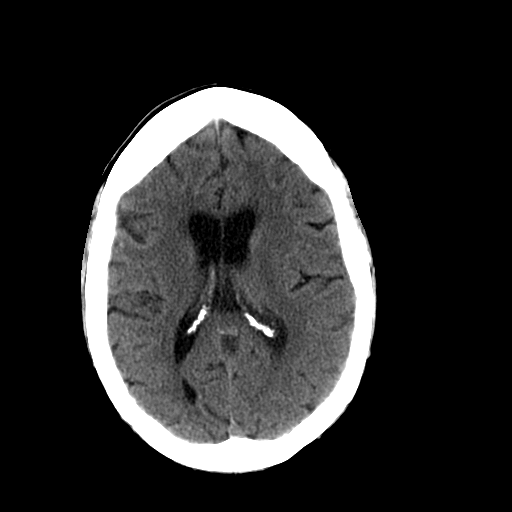
[im 18/32  brain]
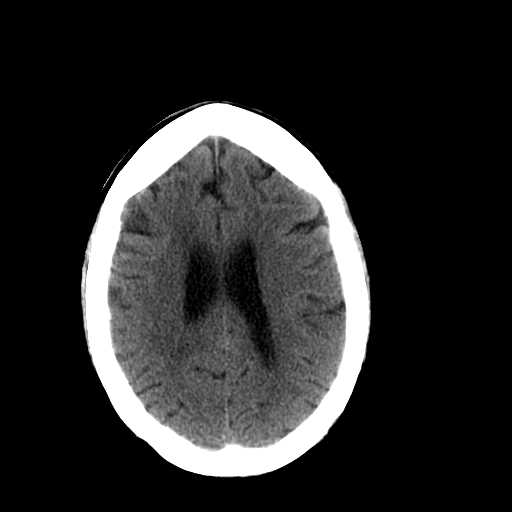
[im 20/32  brain]
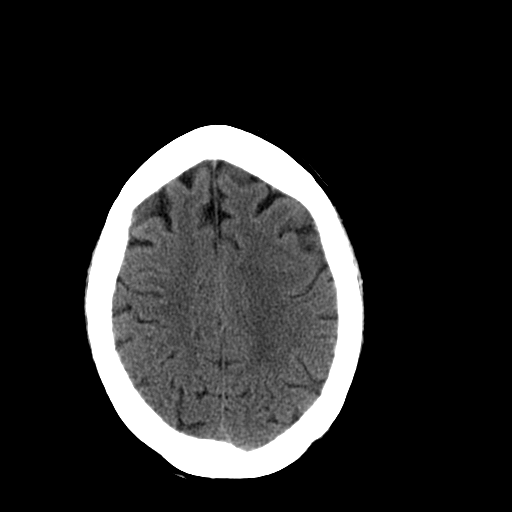
[im 20/32  bone]
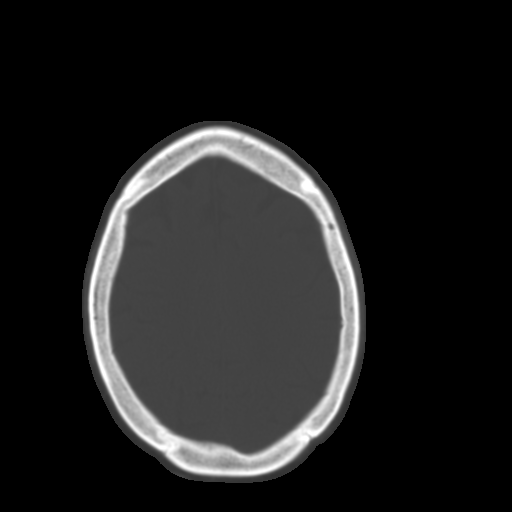
[im 23/32  brain]
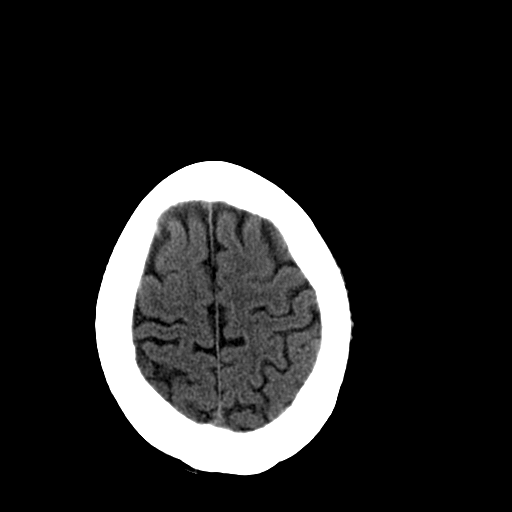
[im 25/32  brain]
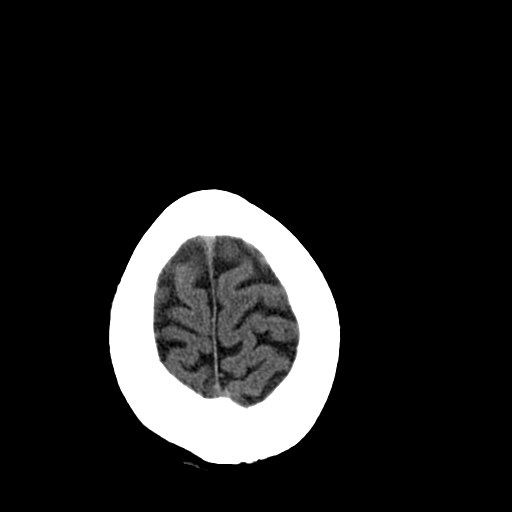
[im 27/32  brain]
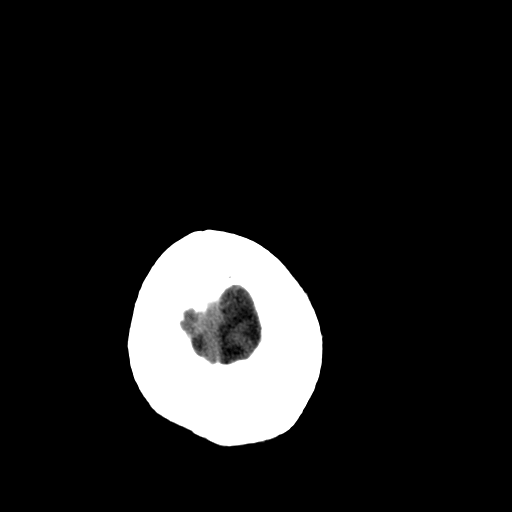
[im 29/32  brain]
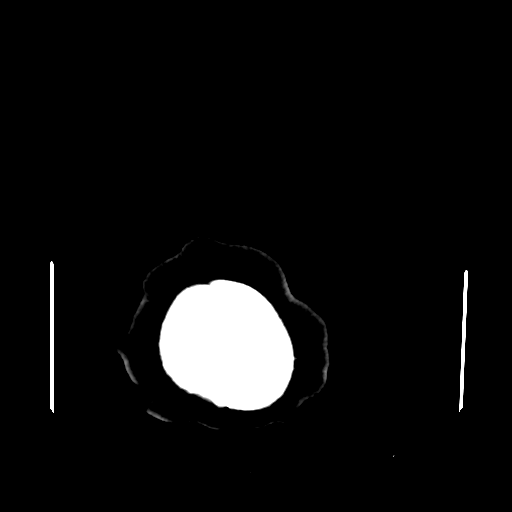
[im 29/32  bone]
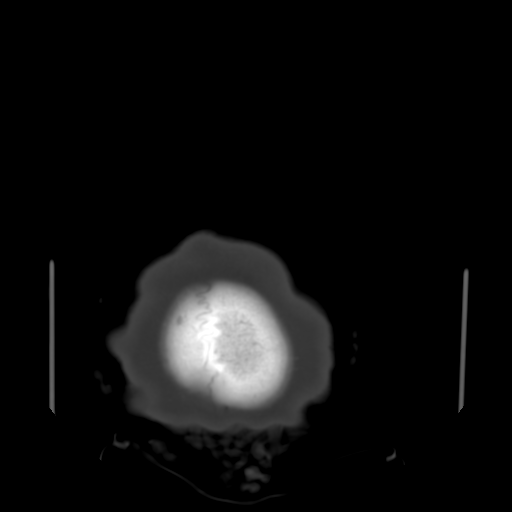

[13 of 30 positions shown; findings below may reference images not displayed]

FINDINGS: The brain has a normal appearance without evidence of
accelerated atrophy, old or acute infarction, mass lesion,
hemorrhage, hydrocephalus or extra-axial collection.  The patient
does have some atherosclerotic calcification of the major vessels
at the base of the brain.  The calvarium is unremarkable.  Sinuses,
middle ears and mastoids are clear.
IMPRESSION: Normal appearance of the brain.  Atherosclerotic calcification of
the major vessels at the base of the brain.

## 2010-10-19 IMAGING — XA IR REPLACE G/J TUBE W/ FLUORO
1 series · 6 of 6 positions shown · non-contrast
Comparison: none

CLINICAL DATA: Gastric outlet obstruction and occluded jejunostomy
feeding catheter.

[Series 300: sp replc gastro/jejuno tube percut w/flu · 6 of 6 slices shown]
[im 1/6]
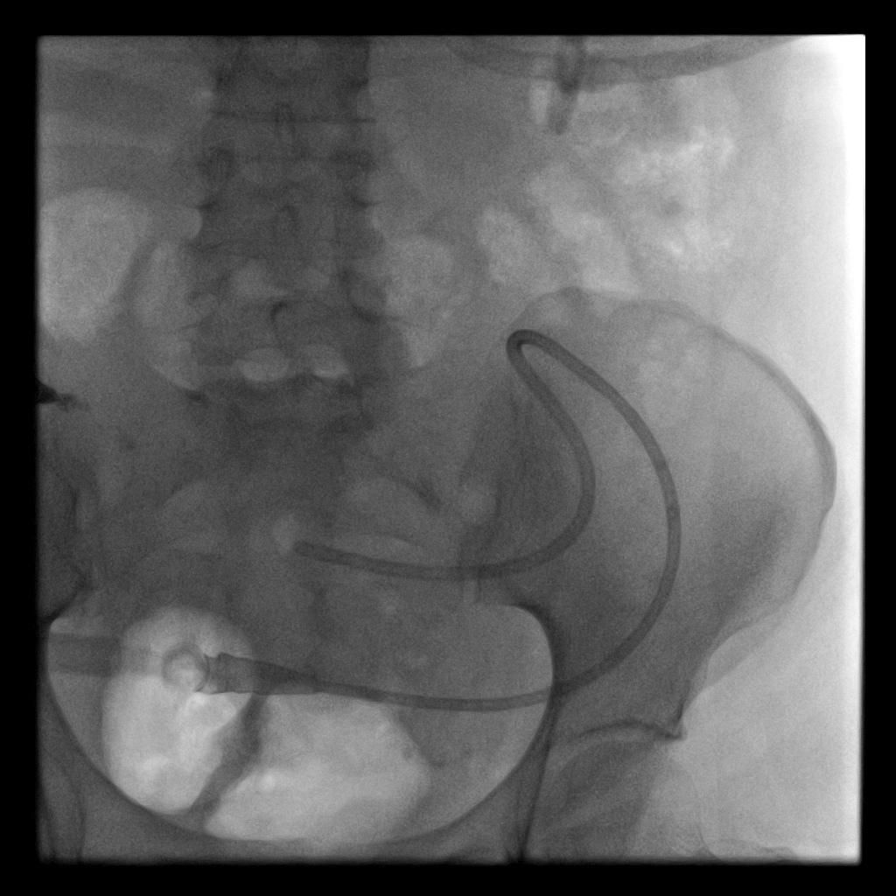
[im 2/6]
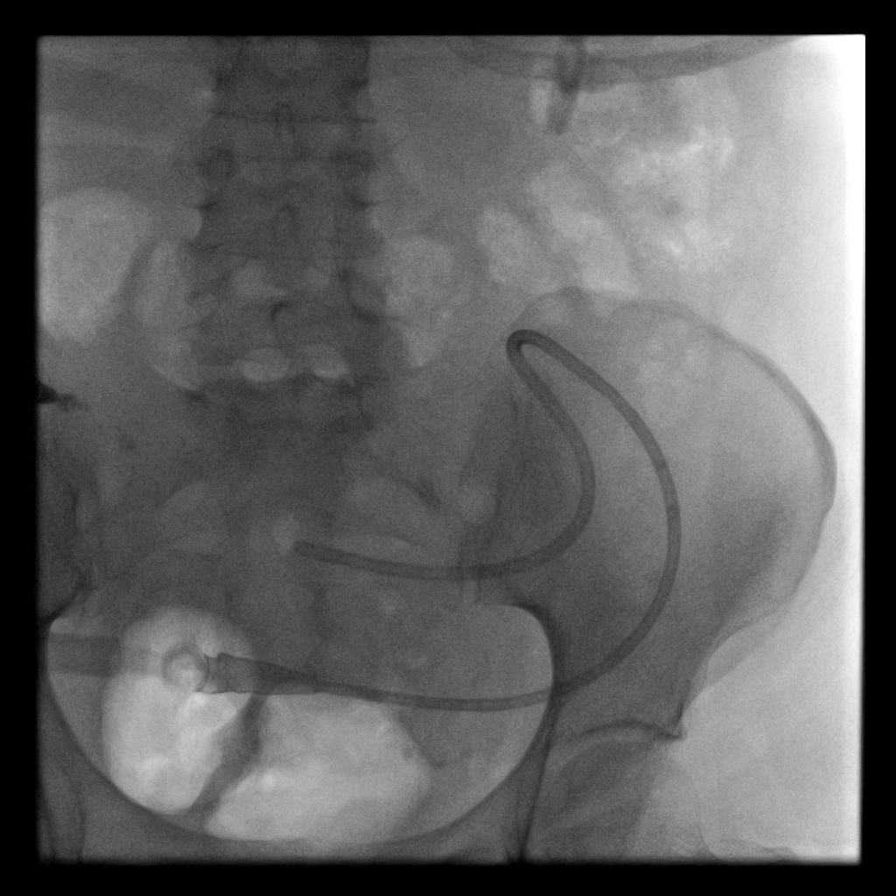
[im 3/6]
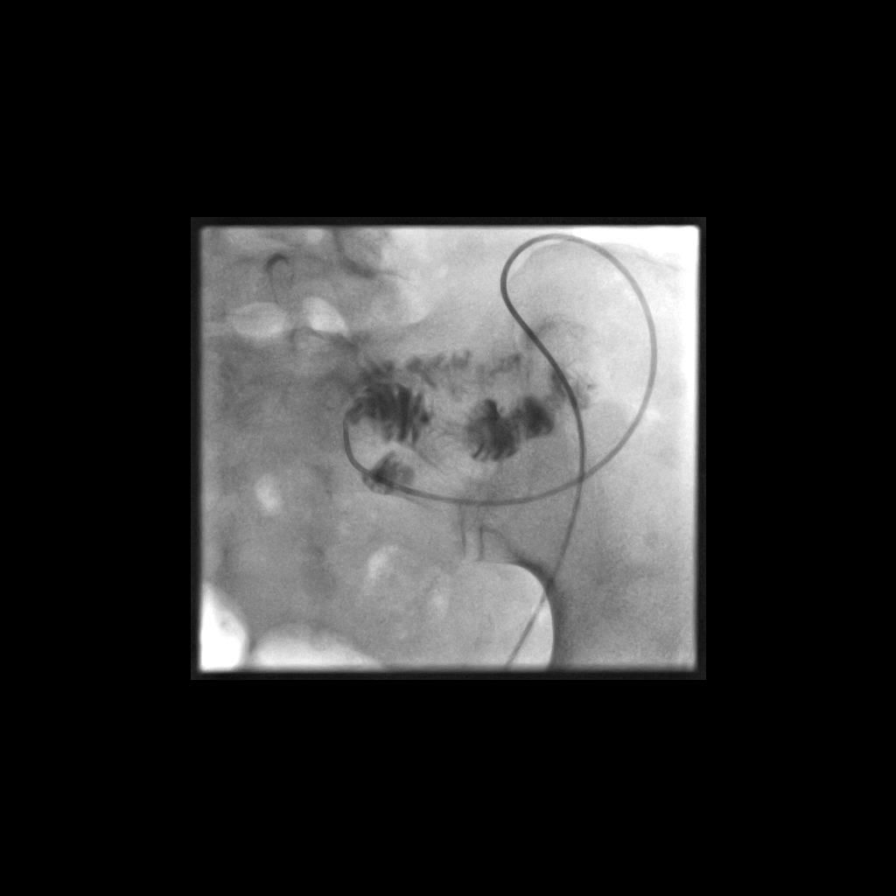
[im 4/6]
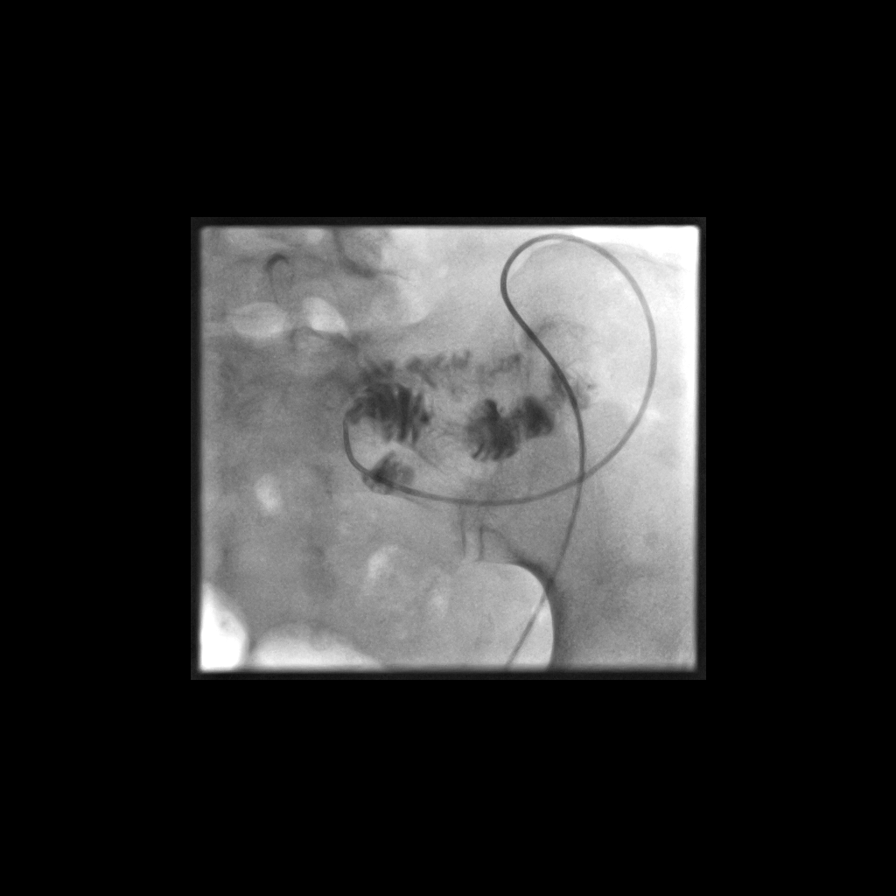
[im 5/6]
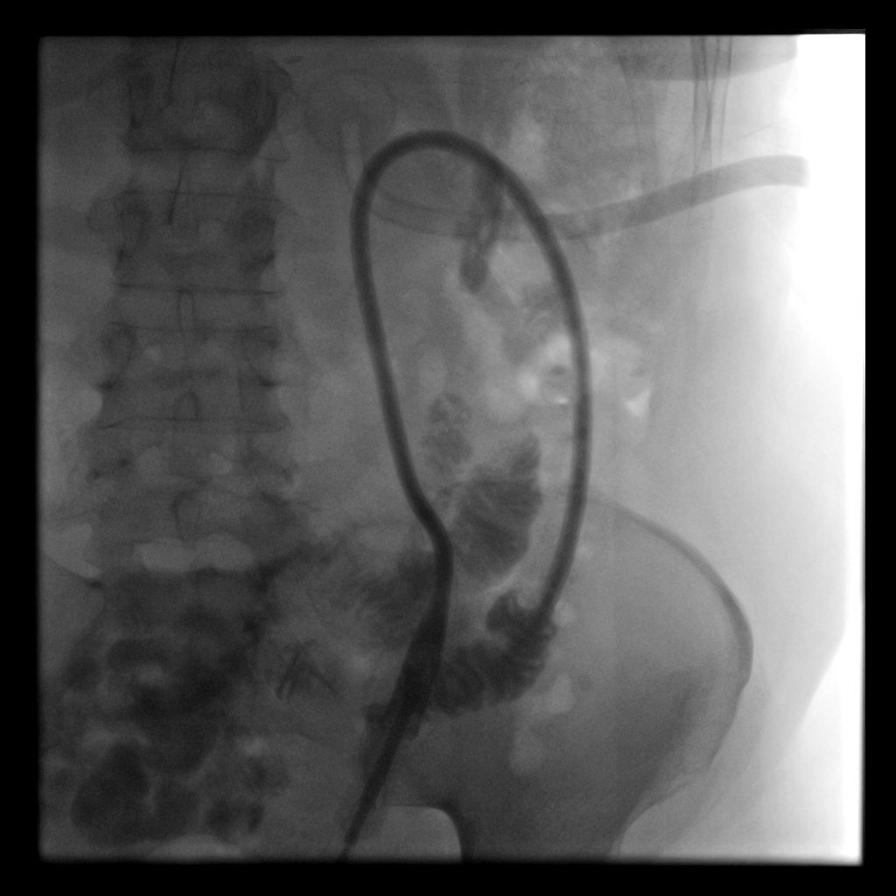
[im 6/6]
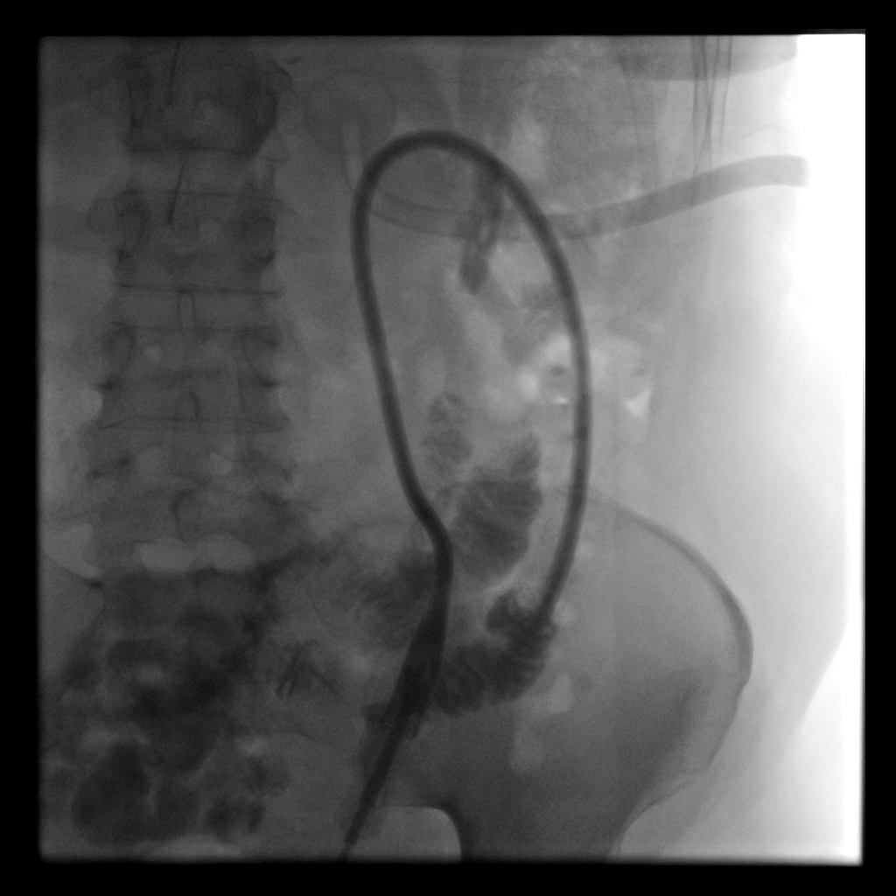

[6 of 6 positions shown; findings below may reference images not displayed]

JEJUNAL CATHETER REPLACEMENT

Contrast:  15 ml Xmnipaque-3WW

Fluoro time:  20 seconds

Attempt was made to inject the preexisting 12-French jejunostomy
catheter.  The catheter was then removed over a guidewire.  A
guidewire was advanced utilizing a 5-French catheter.  A new 14-
French jejunostomy catheter was placed.  The tip of the red rubber
catheter was cut in order to allow over the wire placement.
Additional distal side holes were also cut. The catheter was
secured at the skin with an Ethilon retention suture.
FINDINGS: Preexisting catheter is completely occluded.  Catheter
was able to be upsized.  Catheter tip lies in the small bowel.
IMPRESSION: Replacement of occluded 12-French jejunostomy catheter for new 14-
French catheter.  The tip lies in the small bowel.

## 2010-10-27 ENCOUNTER — Other Ambulatory Visit: Payer: Self-pay | Admitting: Internal Medicine

## 2010-10-28 ENCOUNTER — Telehealth: Payer: Self-pay | Admitting: Internal Medicine

## 2010-10-28 IMAGING — XA IR US GUIDE VASC ACCESS RIGHT
1 series · 2 of 2 positions shown · non-contrast
Comparison: none

CLINICAL DATA: Dehydration and needs IV fluids.

UPPER EXTREMITY PICC PLACEMENT WITH ULTRASOUND AND FLUORO GUIDANCE
TECHNIQUE: The right arm was prepped with chlorhexidine, draped in
the usual sterile fashion using maximum barrier technique and
infiltrated locally with 1% Lidocaine.  Ultrasound demonstrated
patency of the right basilic vein, and this was documented with an
image.  Under real-time ultrasound guidance, this vein was accessed
with a 21 gauge micropuncture needle and image documentation was
performed.  The needle was exchanged over a guidewire for a peel-
away sheath through which a five French single lumen PICC trimmed
to 34cm was advanced, positioned with its tip in the SVC.
Fluoroscopy during the procedure and fluoro spot radiograph
confirms appropriate catheter position.  The catheter was flushed,
secured to the skin with Prolene sutures, and covered with a
sterile dressing.  No immediate complication.
Fluoroscopy Time: 0.7 minutes.

[Series 1: fl  angio · 2 of 2 slices shown]
[im 1/2]
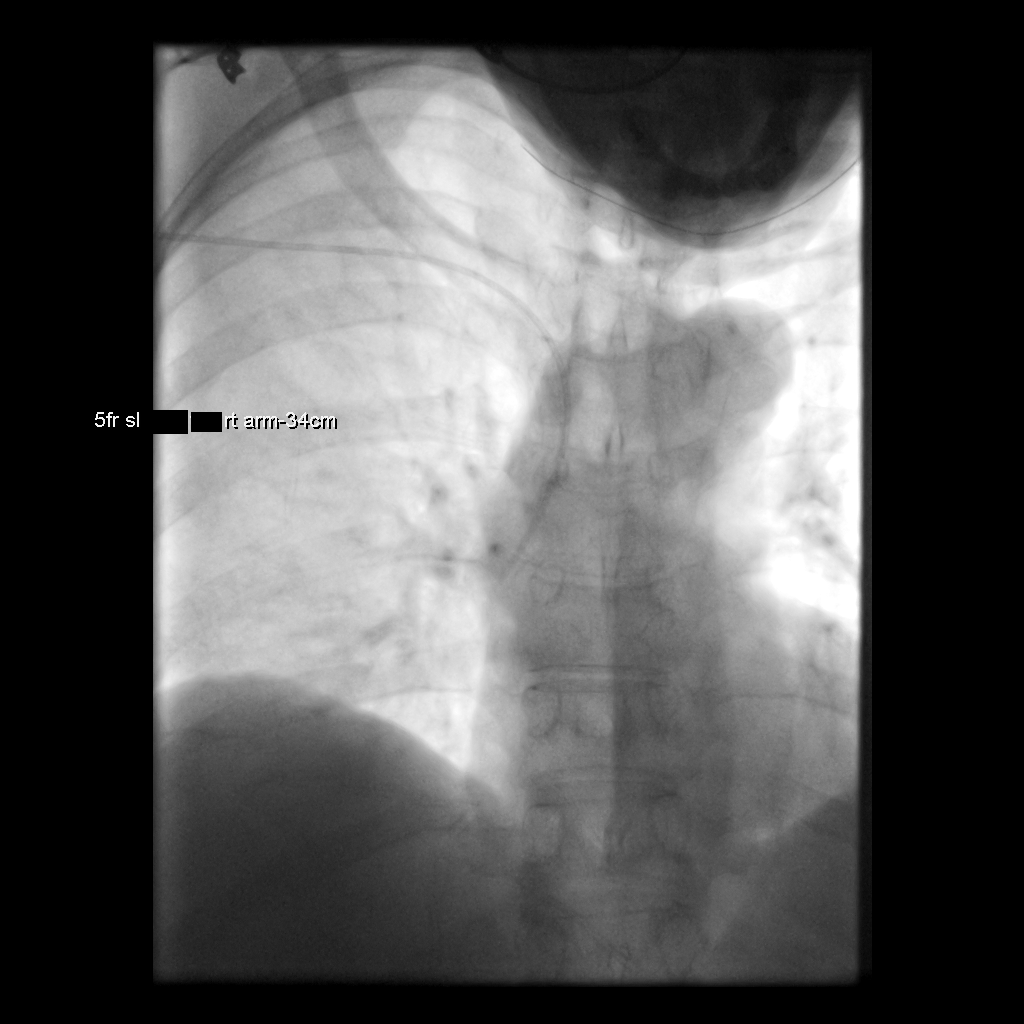
[im 2/2]
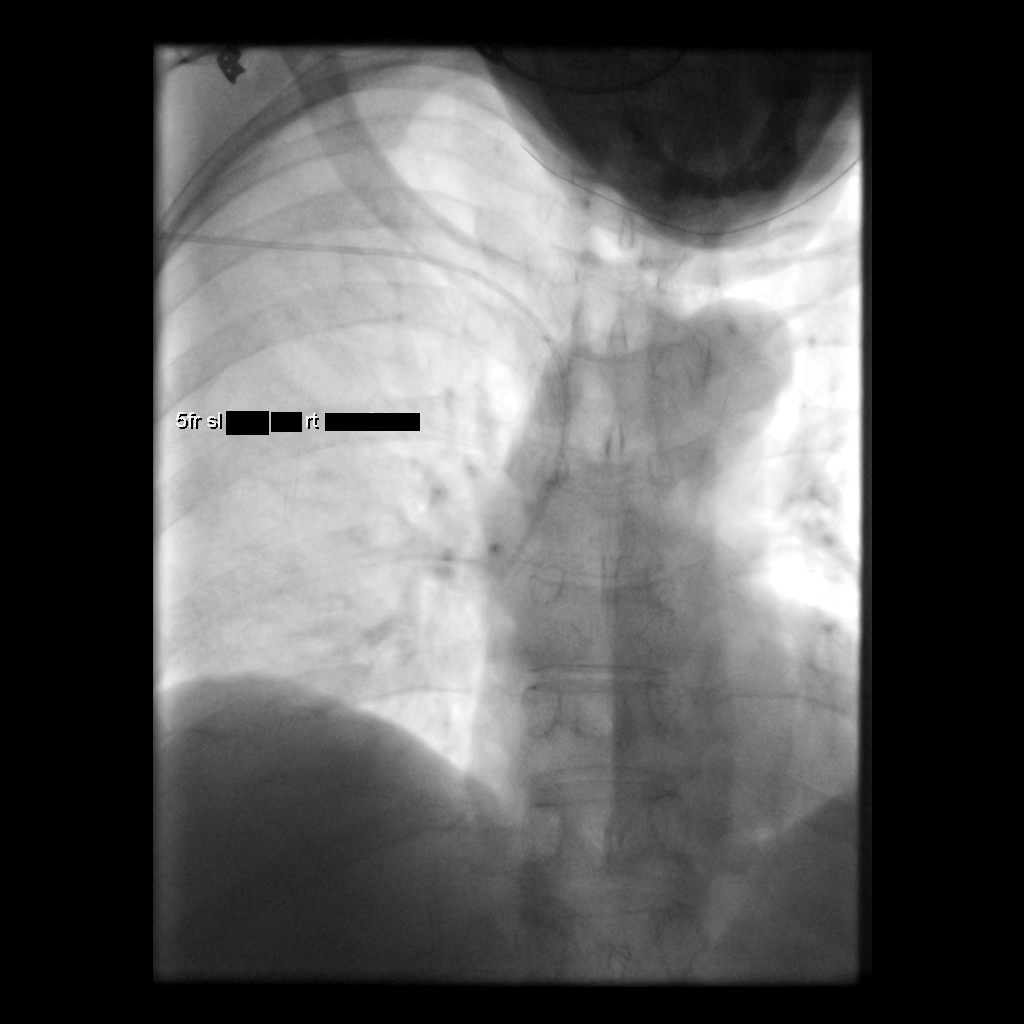

[2 of 2 positions shown; findings below may reference images not displayed]

IMPRESSION: Technically successful right arm PICC placement with ultrasound and
fluoroscopic guidance.  The catheter is ready for use.

## 2010-10-28 NOTE — Telephone Encounter (Signed)
Reviewed, OK to refill x1 but please tell the pt she  Has to stay within the prescribed amount  Every month.

## 2010-10-28 NOTE — Telephone Encounter (Signed)
Patient requested refills on voltaren gel and promethazine liquid. The voltaren gel was denied as Dr Juanda Chance has already stated on 09/18/10 that she does not wish to prescribe more. The promethazine liquid was also denied as she was just given #240 ml on 10/06/10 from our office. At the time of her office visit, the visit note states that the patient will not be getting any controlled substances from Dr Carolynn Sayers. On 09/26/10, Dr Carolynn Sayers prescribed 750 ml of promethazine. At her office visit with [place], she was given #240 ml on 10/06/10. At [facility], she apparently has a prescription on file from Dr Georganna Skeans for 8 ounces. I have advised the patient that she is to take the promethazine every 4-6 hours AS NEEDED, NOT every 4-6 hours. Patient states, "can you tell Dr Juanda Chance I really need it." "The suppositories just dont do that good for me." Dr Juanda Chance, do you want me to refill early?

## 2010-10-29 MED ORDER — PROMETHAZINE HCL 6.25 MG/5ML PO SYRP: ORAL_SOLUTION | ORAL | Status: DC

## 2010-10-29 NOTE — Telephone Encounter (Signed)
Advised patient that Dr Juanda Chance is okay with her getting promethazine refill early this month. However, from this point forward, her promethazine prescription must last an entire month. Patient states "that little bottle has to last a whole month?" Explained that yes, it does need to last 1 month. Patient verbalizes understanding but states "I dont know how Im gonna do that."

## 2010-11-10 ENCOUNTER — Other Ambulatory Visit: Payer: Self-pay | Admitting: Internal Medicine

## 2010-11-19 ENCOUNTER — Other Ambulatory Visit: Payer: Self-pay | Admitting: Internal Medicine

## 2010-11-25 ENCOUNTER — Other Ambulatory Visit: Payer: Self-pay | Admitting: Internal Medicine

## 2010-12-03 ENCOUNTER — Telehealth: Payer: Self-pay | Admitting: Internal Medicine

## 2010-12-03 MED ORDER — FITTED BRIEFS SMALL MISC: Status: DC

## 2010-12-03 NOTE — Telephone Encounter (Signed)
Addended by: Richardson Chiquito on: 12/03/2010 04:21 PM   Modules accepted: Orders

## 2010-12-03 NOTE — Telephone Encounter (Signed)
I need fax number that order needs to go to. Patient will call me back with this information.

## 2010-12-03 NOTE — Telephone Encounter (Signed)
Order faxed to [facility] as requested by patient.

## 2010-12-08 IMAGING — XA IR REPLC DUODEN/JEJUNO TUBE PERCUT W/FLUORO
1 series · 1 of 1 positions shown · non-contrast
Comparison: none

CLINICAL DATA: Occluded J tube

REPLACE D/J TUBE W/FLOURO
Procedure: The existing J tube was cut and exchanged over a Benson
wire for a new 16-French red rubber catheter.  It was sewn in
place.  Contrast was injected.

[Series 300: sp replc duoden/jejuno tube percut w/flu · 1 of 1 slices shown]
[im 1/1]
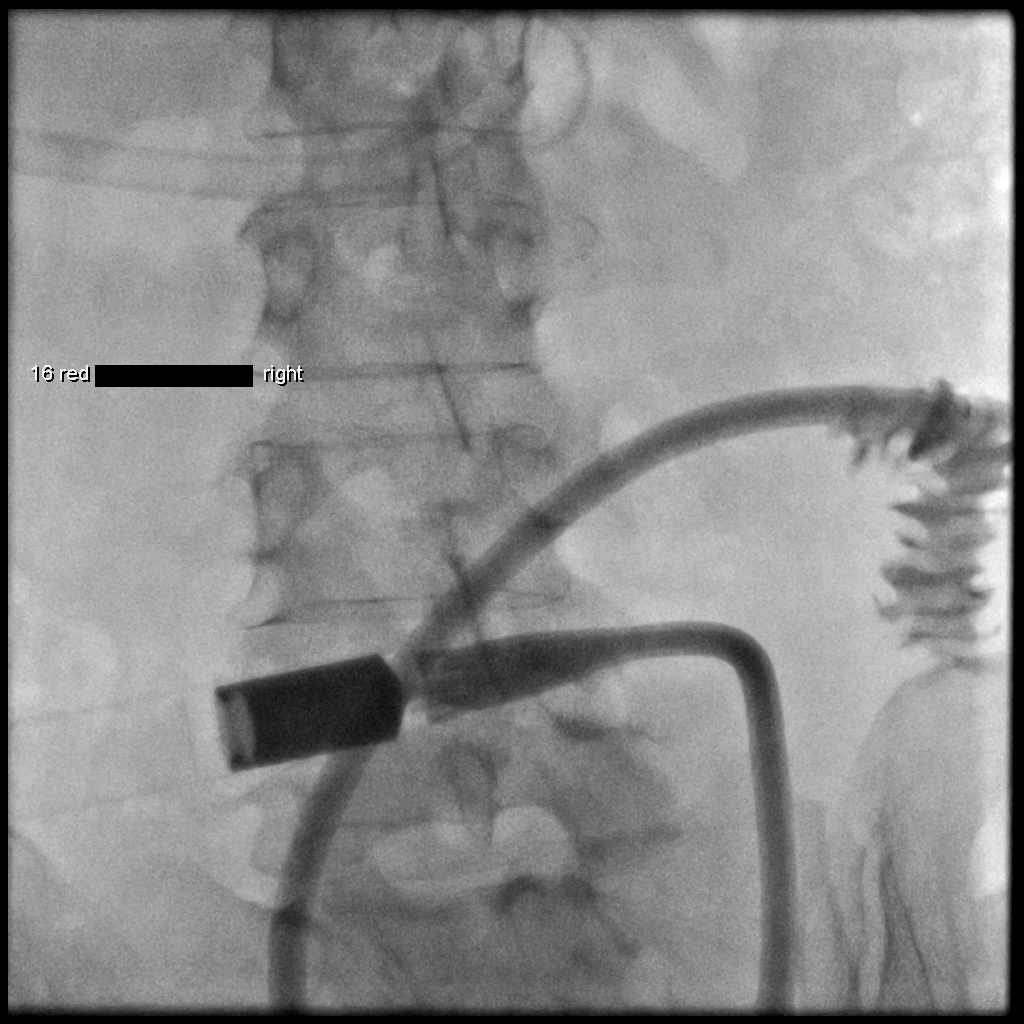

[1 of 1 positions shown; findings below may reference images not displayed]

FINDINGS: The tip of the catheter is in the jejunum.
IMPRESSION: Successful jejunostomy tube exchange.

## 2010-12-15 ENCOUNTER — Other Ambulatory Visit: Payer: Self-pay | Admitting: Internal Medicine

## 2010-12-15 ENCOUNTER — Other Ambulatory Visit: Payer: Self-pay | Admitting: Gastroenterology

## 2010-12-15 NOTE — Telephone Encounter (Signed)
Dr Juanda Chance pt

## 2010-12-24 ENCOUNTER — Other Ambulatory Visit: Payer: Self-pay | Admitting: Physician Assistant

## 2010-12-24 ENCOUNTER — Other Ambulatory Visit: Payer: Self-pay | Admitting: Internal Medicine

## 2010-12-25 ENCOUNTER — Telehealth: Payer: Self-pay | Admitting: Physician Assistant

## 2010-12-26 ENCOUNTER — Other Ambulatory Visit: Payer: Self-pay | Admitting: Internal Medicine

## 2010-12-26 MED ORDER — METOCLOPRAMIDE HCL 5 MG/5ML PO SOLN: ORAL | Status: DC

## 2010-12-26 NOTE — Telephone Encounter (Signed)
I have spoken with Dr Juanda Chance and she states that it is okay for patient to have Reglan (she is also on phenergan and zofran). Rx sent. Patient advised.

## 2010-12-30 ENCOUNTER — Ambulatory Visit (HOSPITAL_COMMUNITY)
Admission: RE | Admit: 2010-12-30 | Discharge: 2010-12-30 | Disposition: A | Discharge status: Home / Self Care | Payer: Medicare Other | Source: Ambulatory Visit | Attending: Internal Medicine | Admitting: Internal Medicine

## 2010-12-30 ENCOUNTER — Other Ambulatory Visit: Payer: Self-pay | Admitting: Gastroenterology

## 2010-12-30 ENCOUNTER — Telehealth: Payer: Self-pay | Admitting: Internal Medicine

## 2010-12-30 ENCOUNTER — Other Ambulatory Visit: Payer: Self-pay | Admitting: Internal Medicine

## 2010-12-30 DIAGNOSIS — K9413 Enterostomy malfunction: Secondary | ICD-10-CM | POA: Insufficient documentation

## 2010-12-30 DIAGNOSIS — K3184 Gastroparesis: Secondary | ICD-10-CM

## 2010-12-30 DIAGNOSIS — K9403 Colostomy malfunction: Secondary | ICD-10-CM | POA: Insufficient documentation

## 2010-12-30 IMAGING — CR DG ABD PORTABLE 1V
1 series · 1 of 1 positions shown · non-contrast
Comparison: None.

CLINICAL DATA: J tube placement.

ABDOMEN - 1 VIEW

[AP]
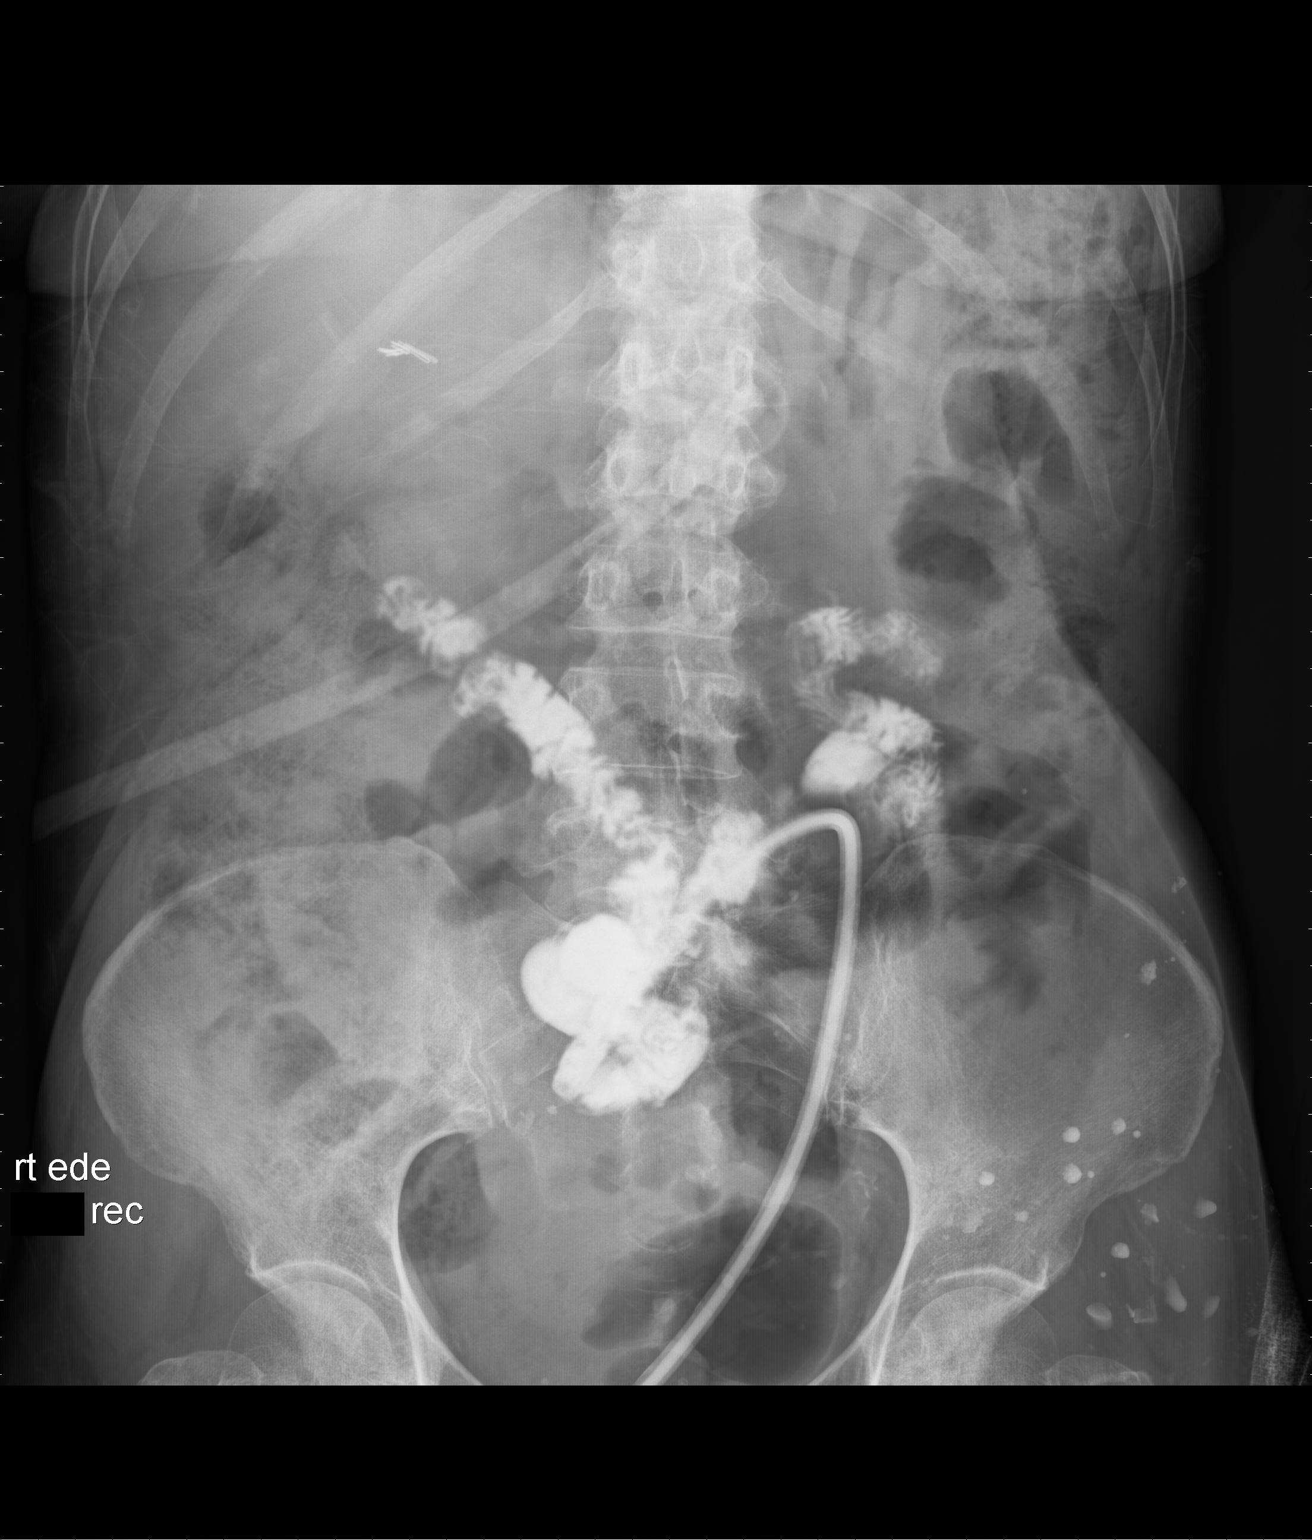

[1 of 1 positions shown; findings below may reference images not displayed]

FINDINGS: The J tube is in a lower midline jejunal loop.  20 ml
Omnipaque was injected through the tube verifying intraluminal
position.  Generous amounts stool in the colon. Gastrostomy tube
appears in satisfactory position in the mid to distal stomach.
IMPRESSION: Specifically, the J tube is in satisfactory position.

## 2010-12-30 MED ORDER — IOHEXOL 300 MG/ML  SOLN
50.0000 mL | Freq: Once | INTRAMUSCULAR | Status: AC | PRN
  Administered 2010-12-30: 5 mL

## 2010-12-30 NOTE — Telephone Encounter (Signed)
Patient states her J tube burst this AM. IR placed patients J tube. Spoke with Inetta Fermo in [place] [facility] and patient needs to come on and they will replace it.Mike Gip, PA aware of these arrangements.

## 2011-01-01 IMAGING — CR DG ABD PORTABLE 1V
1 series · 1 of 1 positions shown · non-contrast
Comparison: 08/09/2009.

CLINICAL DATA: Check tube placement.

ABDOMEN - 1 VIEW

[view not recorded]
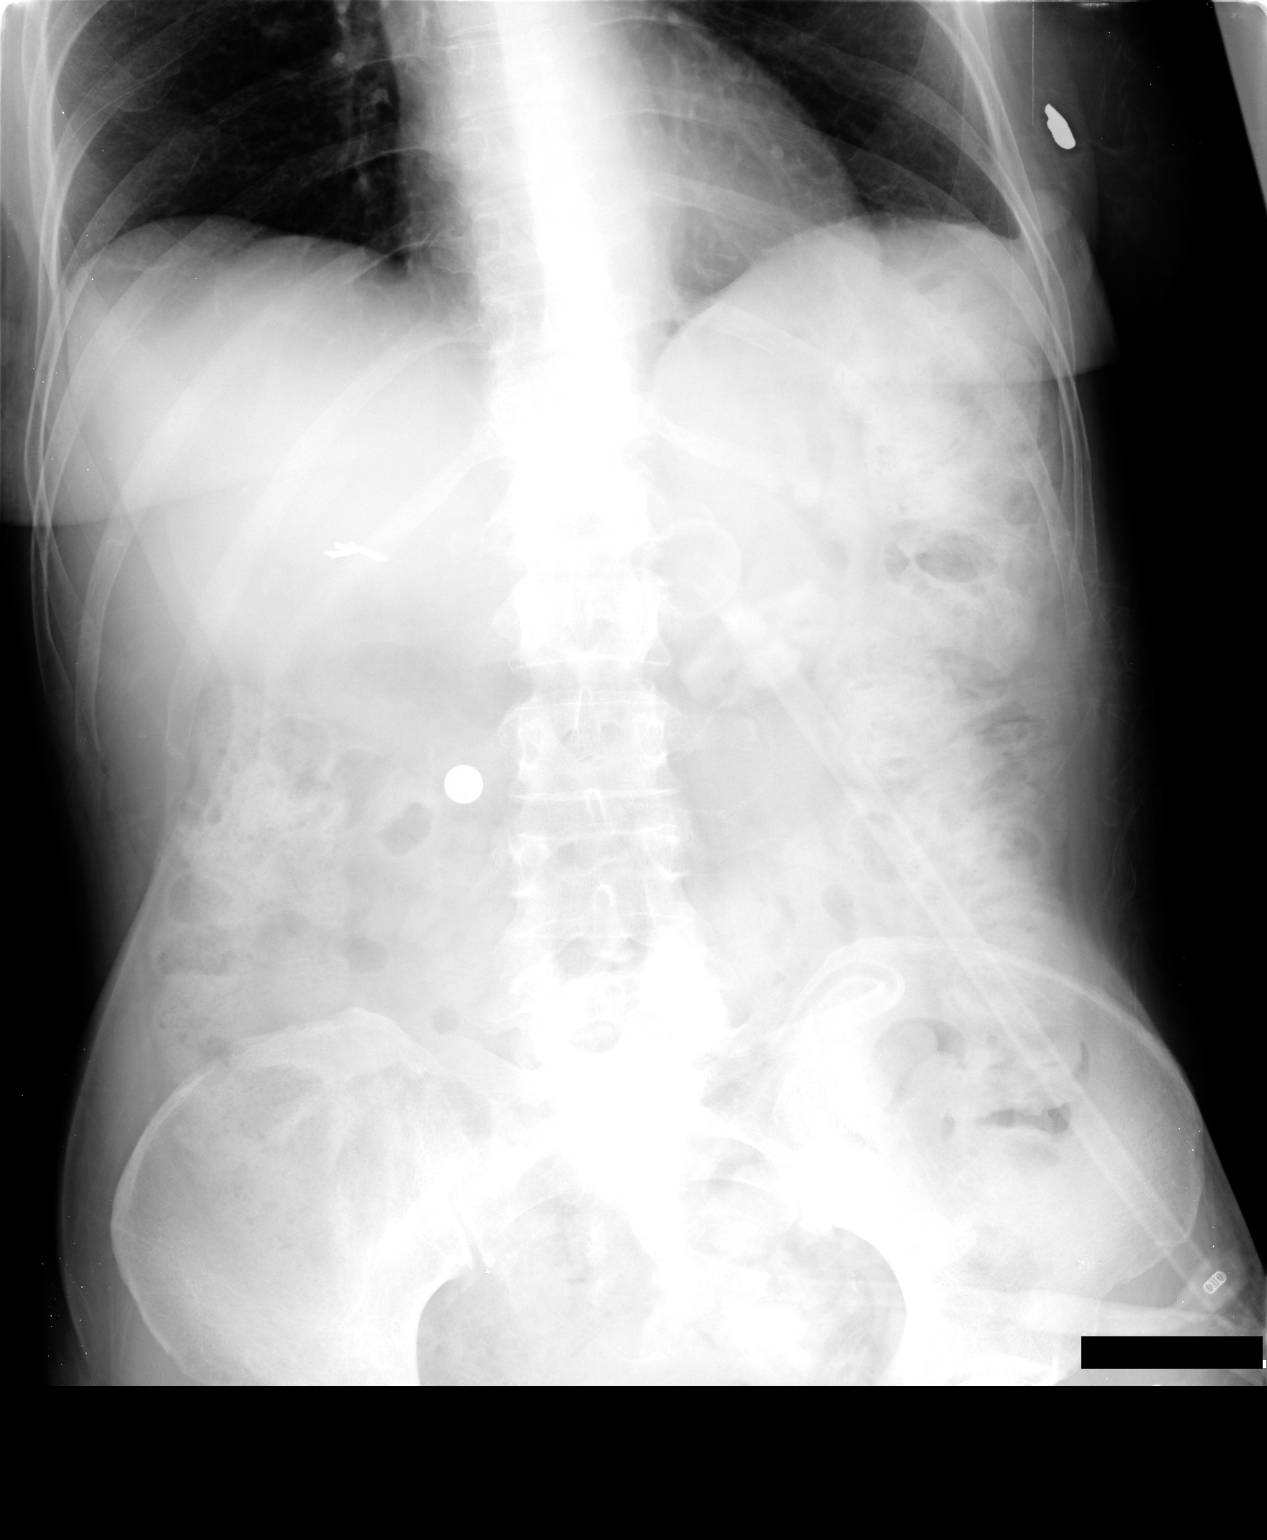

[1 of 1 positions shown; findings below may reference images not displayed]

FINDINGS: The previously injected jejunal tube tip is in the upper
pelvis to the left of midline.  This is similar to the previous
position.  A gastrostomy tube remains in place with its tip in the
region of the mid stomach.  Cholecystectomy clips are again
demonstrated.  Left mid abdominal surgical staples.  Normal bowel
gas pattern.  Unremarkable bones.
IMPRESSION: Gastrostomy and jejunal tubes, as described above.  No acute
abnormality.

## 2011-01-01 IMAGING — CR DG ABDOMEN 1V
1 series · 1 of 1 positions shown · non-contrast
Comparison: None.

CLINICAL DATA: Medically complex patient.  Confirm jejunostomy tube
placement.

ABDOMEN - 1 VIEW

[t abdomen supine *]
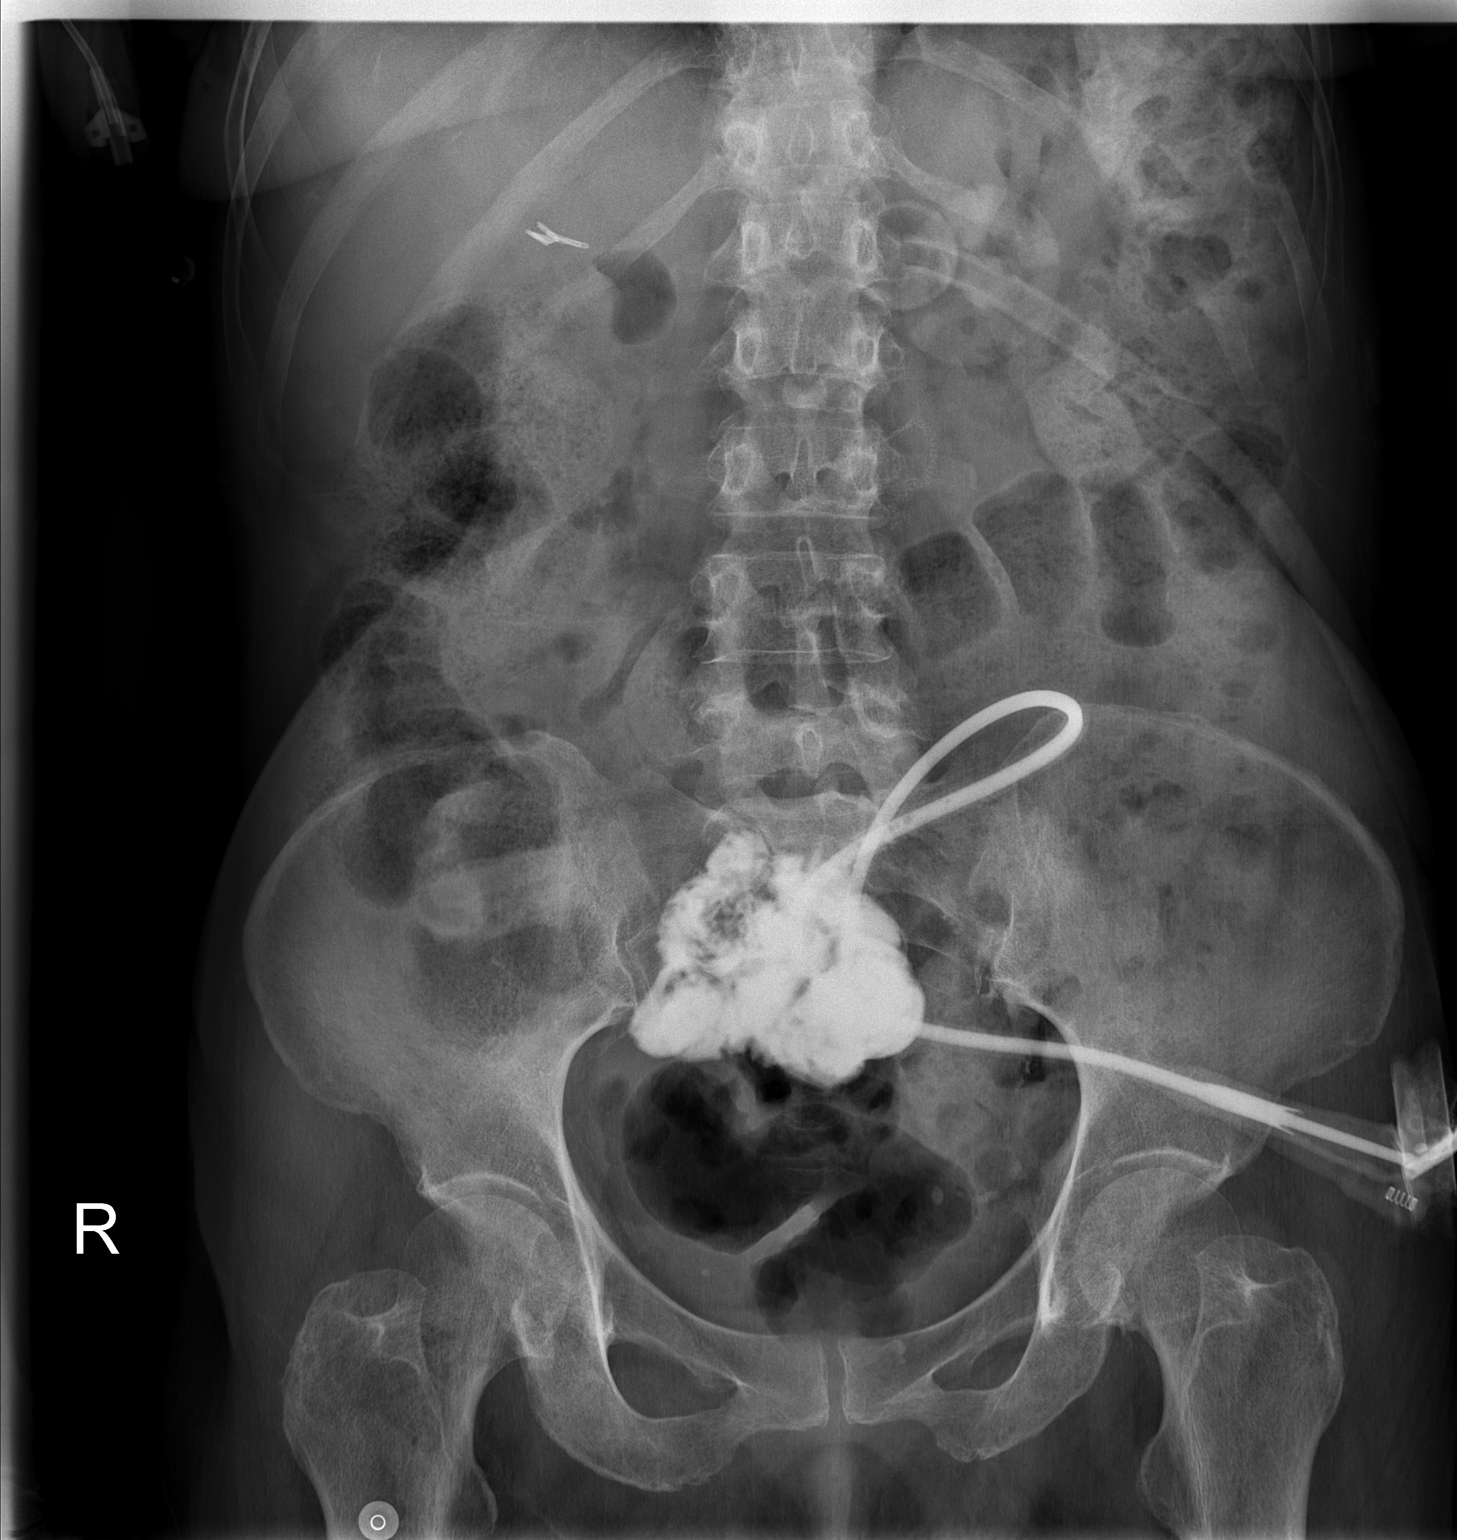

[1 of 1 positions shown; findings below may reference images not displayed]

FINDINGS: Abdominal radiograph was obtained following contrast
injection through the jejunostomy tube.  This confirms that the
jejunostomy is located within small bowel in the pelvis. No leak of
injected contrast identified.

A percutaneous gastrostomy tube is also seen.  There is no evidence
of dilated bowel loops.  Large amount of colonic stool noted.
IMPRESSION: Jejunostomy tube located within small bowel in the pelvis.  No leak
of injected contrast seen.

## 2011-01-12 ENCOUNTER — Other Ambulatory Visit: Payer: Self-pay | Admitting: Internal Medicine

## 2011-01-12 NOTE — Telephone Encounter (Signed)
Dr Juanda Chance, patient requests more Reglan. She was given #240 ml on 12/26/10 with 1 additional refill (enough until 02/25/11). However, per pharmacy, she has already used both refills of the Reglan. Do you want me to rx more or tell her that she may not have more until 02/25/11?

## 2011-01-21 LAB — LIPASE, BLOOD
Lipase: 35
Lipase: 396 — ABNORMAL HIGH
Lipase: 57
Lipase: 94 — ABNORMAL HIGH

## 2011-01-21 LAB — COMPREHENSIVE METABOLIC PANEL
ALT: 29
ALT: 39 — ABNORMAL HIGH
ALT: 50 — ABNORMAL HIGH
AST: 35
AST: 37
AST: 60 — ABNORMAL HIGH
Albumin: 3.2 — ABNORMAL LOW
Albumin: 3.5
Albumin: 3.5
Alkaline Phosphatase: 64
Alkaline Phosphatase: 66
Alkaline Phosphatase: 74
BUN: 4 — ABNORMAL LOW
BUN: 5 — ABNORMAL LOW
BUN: 7
CO2: 24
CO2: 25
CO2: 27
Calcium: 8.7
Calcium: 9
Calcium: 9
Chloride: 101
Chloride: 103
Chloride: 107
Creatinine, Ser: 0.62
Creatinine, Ser: 0.7
Creatinine, Ser: 0.71
GFR calc Af Amer: >60
GFR calc Af Amer: >60
GFR calc Af Amer: >60
GFR calc non Af Amer: >60
GFR calc non Af Amer: >60
GFR calc non Af Amer: >60
Glucose, Bld: 101 — ABNORMAL HIGH
Glucose, Bld: 109 — ABNORMAL HIGH
Glucose, Bld: 123 — ABNORMAL HIGH
Potassium: 3.1 — ABNORMAL LOW
Potassium: 3.4 — ABNORMAL LOW
Potassium: 4.3
Sodium: 134 — ABNORMAL LOW
Sodium: 138
Sodium: 138
Total Bilirubin: 0.6
Total Bilirubin: 0.7
Total Bilirubin: 0.9
Total Protein: 6.3
Total Protein: 6.4
Total Protein: 6.6

## 2011-01-21 LAB — DIFFERENTIAL
Basophils Absolute: 0
Basophils Relative: 0
Eosinophils Absolute: 0.1
Eosinophils Relative: 1
Lymphocytes Relative: 29
Lymphs Abs: 2.7
Monocytes Absolute: 0.5
Monocytes Relative: 5
Neutro Abs: 6
Neutrophils Relative %: 64

## 2011-01-21 LAB — CBC
HCT: 32.1 — ABNORMAL LOW
HCT: 34.8 — ABNORMAL LOW
HCT: 38.4
Hemoglobin: 10.1 — ABNORMAL LOW
Hemoglobin: 11.1 — ABNORMAL LOW
Hemoglobin: 12.3
MCHC: 31.6
MCHC: 31.9
MCHC: 32
MCV: 72.4 — ABNORMAL LOW
MCV: 72.5 — ABNORMAL LOW
MCV: 72.7 — ABNORMAL LOW
Platelets: 305
Platelets: 310
Platelets: 366
RBC: 4.42
RBC: 4.8
RBC: 5.3 — ABNORMAL HIGH
RDW: 14.1
RDW: 14.2
RDW: 14.3
WBC: 10.8 — ABNORMAL HIGH
WBC: 8.7
WBC: 9.4

## 2011-01-21 LAB — HEPATIC FUNCTION PANEL
ALT: 54 — ABNORMAL HIGH
AST: 61 — ABNORMAL HIGH
Albumin: 4.2
Alkaline Phosphatase: 91
Bilirubin, Direct: 0.2
Indirect Bilirubin: 0.1 — ABNORMAL LOW
Total Bilirubin: 0.3
Total Protein: 8.2

## 2011-01-21 LAB — I-STAT 8, (EC8 V) (CONVERTED LAB)
Acid-Base Excess: 5 — ABNORMAL HIGH
BUN: 9
Bicarbonate: 31.3 — ABNORMAL HIGH
Chloride: 102
Glucose, Bld: 104 — ABNORMAL HIGH
HCT: 44
Hemoglobin: 15
Operator id: 294341
Potassium: 3.7
Sodium: 139
TCO2: 33
pCO2, Ven: 51.8 — ABNORMAL HIGH
pH, Ven: 7.389 — ABNORMAL HIGH

## 2011-01-21 LAB — POCT I-STAT CREATININE
Creatinine, Ser: 0.8
Operator id: 294341

## 2011-01-21 LAB — AMYLASE
Amylase: 192 — ABNORMAL HIGH
Amylase: 42

## 2011-01-22 ENCOUNTER — Other Ambulatory Visit: Payer: Self-pay | Admitting: Internal Medicine

## 2011-01-23 ENCOUNTER — Other Ambulatory Visit: Payer: Self-pay | Admitting: Internal Medicine

## 2011-01-26 ENCOUNTER — Telehealth: Payer: Self-pay | Admitting: Internal Medicine

## 2011-01-26 MED ORDER — SUCRALFATE 1 GM/10ML PO SUSP: ORAL | Status: DC

## 2011-01-26 MED ORDER — HYDROCODONE-ACETAMINOPHEN 7.5-500 MG/15ML PO SOLN: 15.0000 mL | ORAL | Status: DC | PRN

## 2011-01-26 MED ORDER — METOCLOPRAMIDE HCL 5 MG/5ML PO SOLN: ORAL | Status: DC

## 2011-01-26 NOTE — Telephone Encounter (Signed)
I have spoken to Dr Juanda Chance. She states that patient should be taking Reglan only ONE teaspoon 4 times daily prn, NOT 4 teaspoons 4 times daily prn. I have also asked Dr Juanda Chance if it is okay to refill carafate and hydrocodone and she is agreeable. I have advised patient that refills have been sent to her pharmacy and I have asked the patient to take only 1 teaspoons 4 times daily prn of the reglan.

## 2011-01-27 LAB — URINALYSIS, ROUTINE W REFLEX MICROSCOPIC
Bilirubin Urine: NEGATIVE
Glucose, UA: NEGATIVE
Hgb urine dipstick: NEGATIVE
Ketones, ur: NEGATIVE
Nitrite: NEGATIVE
Protein, ur: NEGATIVE
Specific Gravity, Urine: 1.027
Urobilinogen, UA: 2 — ABNORMAL HIGH
pH: 7

## 2011-01-27 LAB — DIFFERENTIAL
Basophils Absolute: 0
Basophils Relative: 0
Eosinophils Absolute: 0.1
Eosinophils Relative: 1
Lymphocytes Relative: 24
Lymphs Abs: 2.5
Monocytes Absolute: 0.5
Monocytes Relative: 5
Neutro Abs: 7.3
Neutrophils Relative %: 70

## 2011-01-27 LAB — COMPREHENSIVE METABOLIC PANEL
ALT: 34
AST: 39 — ABNORMAL HIGH
Albumin: 3.7
Alkaline Phosphatase: 65
BUN: 14
CO2: 26
Calcium: 8.9
Chloride: 106
Creatinine, Ser: 0.57
GFR calc Af Amer: >60
GFR calc non Af Amer: >60
Glucose, Bld: 98
Potassium: 3.5
Sodium: 139
Total Bilirubin: 0.3
Total Protein: 6.9

## 2011-01-27 LAB — LIPASE, BLOOD: Lipase: 41

## 2011-01-27 LAB — CBC
HCT: 34.2 — ABNORMAL LOW
Hemoglobin: 10.7 — ABNORMAL LOW
MCHC: 31.4
MCV: 73.4 — ABNORMAL LOW
Platelets: 284
RBC: 4.66
RDW: 15.8 — ABNORMAL HIGH
WBC: 10.4

## 2011-02-02 LAB — GLUCOSE, CAPILLARY
Glucose-Capillary: 108 — ABNORMAL HIGH
Glucose-Capillary: 72

## 2011-02-06 LAB — POCT I-STAT GLUCOSE
Glucose, Bld: 85 mg/dL (ref 70–99)
Operator id: 194801

## 2011-02-11 LAB — CBC
HCT: 28.4 — ABNORMAL LOW
HCT: 29.6 — ABNORMAL LOW
HCT: 30.8 — ABNORMAL LOW
HCT: 32.3 — ABNORMAL LOW
HCT: 33.1 — ABNORMAL LOW
HCT: 33.3 — ABNORMAL LOW
HCT: 33.4 — ABNORMAL LOW
HCT: 35.3 — ABNORMAL LOW
Hemoglobin: 10.2 — ABNORMAL LOW
Hemoglobin: 10.3 — ABNORMAL LOW
Hemoglobin: 10.5 — ABNORMAL LOW
Hemoglobin: 10.8 — ABNORMAL LOW
Hemoglobin: 11.2 — ABNORMAL LOW
Hemoglobin: 8.9 — ABNORMAL LOW
Hemoglobin: 9.4 — ABNORMAL LOW
Hemoglobin: 9.7 — ABNORMAL LOW
MCHC: 31.2
MCHC: 31.3
MCHC: 31.5
MCHC: 31.6
MCHC: 31.6
MCHC: 31.6
MCHC: 31.7
MCHC: 32.5
MCV: 74.7 — ABNORMAL LOW
MCV: 75.1 — ABNORMAL LOW
MCV: 75.2 — ABNORMAL LOW
MCV: 75.5 — ABNORMAL LOW
MCV: 75.8 — ABNORMAL LOW
MCV: 76 — ABNORMAL LOW
MCV: 76.2 — ABNORMAL LOW
MCV: 76.4 — ABNORMAL LOW
Platelets: 218
Platelets: 240
Platelets: 254
Platelets: 294
Platelets: 297
Platelets: 313
Platelets: 384
Platelets: 489 — ABNORMAL HIGH
RBC: 3.74 — ABNORMAL LOW
RBC: 3.94
RBC: 4.09
RBC: 4.25
RBC: 4.33
RBC: 4.39
RBC: 4.46
RBC: 4.7
RDW: 14.6 — ABNORMAL HIGH
RDW: 14.6 — ABNORMAL HIGH
RDW: 14.6 — ABNORMAL HIGH
RDW: 14.6 — ABNORMAL HIGH
RDW: 14.6 — ABNORMAL HIGH
RDW: 14.7 — ABNORMAL HIGH
RDW: 14.8 — ABNORMAL HIGH
RDW: 14.9 — ABNORMAL HIGH
WBC: 10.5
WBC: 10.6 — ABNORMAL HIGH
WBC: 11.3 — ABNORMAL HIGH
WBC: 11.7 — ABNORMAL HIGH
WBC: 11.9 — ABNORMAL HIGH
WBC: 15.2 — ABNORMAL HIGH
WBC: 16.5 — ABNORMAL HIGH
WBC: 20.8 — ABNORMAL HIGH

## 2011-02-11 LAB — POCT I-STAT 3, ART BLOOD GAS (G3+)
Bicarbonate: 25.6 — ABNORMAL HIGH
O2 Saturation: 95
Operator id: 199901
Patient temperature: 99.4
TCO2: 27
pCO2 arterial: 44.5
pH, Arterial: 7.37
pO2, Arterial: 80

## 2011-02-11 LAB — COMPREHENSIVE METABOLIC PANEL
ALT: 15
AST: 23
Albumin: 2.7 — ABNORMAL LOW
Alkaline Phosphatase: 47
BUN: 2 — ABNORMAL LOW
CO2: 27
Calcium: 8.6
Chloride: 98
Creatinine, Ser: 0.78
GFR calc Af Amer: >60
GFR calc non Af Amer: >60
Glucose, Bld: 117 — ABNORMAL HIGH
Potassium: 3.9
Sodium: 132 — ABNORMAL LOW
Total Bilirubin: 1.3 — ABNORMAL HIGH
Total Protein: 6.2

## 2011-02-11 LAB — BASIC METABOLIC PANEL
BUN: 10
BUN: 5 — ABNORMAL LOW
BUN: 5 — ABNORMAL LOW
BUN: 6
BUN: 8
BUN: 8
CO2: 24
CO2: 26
CO2: 27
CO2: 27
CO2: 27
CO2: 28
Calcium: 7.2 — ABNORMAL LOW
Calcium: 8.6
Calcium: 8.7
Calcium: 8.8
Calcium: 9.2
Calcium: 9.3
Chloride: 100
Chloride: 100
Chloride: 101
Chloride: 105
Chloride: 98
Chloride: 98
Creatinine, Ser: 0.63
Creatinine, Ser: 0.66
Creatinine, Ser: 0.69
Creatinine, Ser: 0.72
Creatinine, Ser: 0.74
Creatinine, Ser: 0.76
GFR calc Af Amer: >60
GFR calc Af Amer: >60
GFR calc Af Amer: >60
GFR calc Af Amer: >60
GFR calc Af Amer: >60
GFR calc Af Amer: >60
GFR calc non Af Amer: >60
GFR calc non Af Amer: >60
GFR calc non Af Amer: >60
GFR calc non Af Amer: >60
GFR calc non Af Amer: >60
GFR calc non Af Amer: >60
Glucose, Bld: 101 — ABNORMAL HIGH
Glucose, Bld: 101 — ABNORMAL HIGH
Glucose, Bld: 108 — ABNORMAL HIGH
Glucose, Bld: 76
Glucose, Bld: 88
Glucose, Bld: 95
Potassium: 2.8 — ABNORMAL LOW
Potassium: 3.6
Potassium: 4
Potassium: 4
Potassium: 4
Potassium: 4.2
Sodium: 134 — ABNORMAL LOW
Sodium: 134 — ABNORMAL LOW
Sodium: 134 — ABNORMAL LOW
Sodium: 135
Sodium: 135
Sodium: 137

## 2011-02-11 LAB — POCT I-STAT 7, (LYTES, BLD GAS, ICA,H+H)
Acid-base deficit: 3 — ABNORMAL HIGH
Bicarbonate: 22.3
Calcium, Ion: 1.2
HCT: 35 — ABNORMAL LOW
Hemoglobin: 11.9 — ABNORMAL LOW
O2 Saturation: 100
Operator id: 119851
Potassium: 3.4 — ABNORMAL LOW
Sodium: 135
TCO2: 23
pCO2 arterial: 38.7
pH, Arterial: 7.369
pO2, Arterial: 208 — ABNORMAL HIGH

## 2011-02-11 LAB — MAGNESIUM: Magnesium: 2

## 2011-02-12 LAB — CBC
HCT: 37.5
Hemoglobin: 11.8 — ABNORMAL LOW
MCHC: 31.4
MCV: 75.5 — ABNORMAL LOW
Platelets: 288
RBC: 4.96
RDW: 14.8 — ABNORMAL HIGH
WBC: 7.7

## 2011-02-12 LAB — BLOOD GAS, ARTERIAL
Acid-Base Excess: 1.8
Bicarbonate: 25.8 — ABNORMAL HIGH
Drawn by: 206361
FIO2: 0.21
O2 Saturation: 94.6
Patient temperature: 98.6
TCO2: 27.1
pCO2 arterial: 40.5
pH, Arterial: 7.42 — ABNORMAL HIGH
pO2, Arterial: 73.2 — ABNORMAL LOW

## 2011-02-12 LAB — COMPREHENSIVE METABOLIC PANEL
ALT: 22
AST: 27
Albumin: 4.1
Alkaline Phosphatase: 55
BUN: 13
CO2: 25
Calcium: 9.7
Chloride: 105
Creatinine, Ser: 0.81
GFR calc Af Amer: >60
GFR calc non Af Amer: >60
Glucose, Bld: 115 — ABNORMAL HIGH
Potassium: 3.9
Sodium: 138
Total Bilirubin: 0.7
Total Protein: 7.4

## 2011-02-12 LAB — TYPE AND SCREEN
ABO/RH(D): O POS
Antibody Screen: NEGATIVE

## 2011-02-12 LAB — URINALYSIS, ROUTINE W REFLEX MICROSCOPIC
Glucose, UA: NEGATIVE
Hgb urine dipstick: NEGATIVE
Ketones, ur: 15 — AB
Nitrite: NEGATIVE
Protein, ur: NEGATIVE
Specific Gravity, Urine: 1.033 — ABNORMAL HIGH
Urobilinogen, UA: 1
pH: 5.5

## 2011-02-12 LAB — PROTIME-INR
INR: 1
Prothrombin Time: 13.3

## 2011-02-12 LAB — URINE MICROSCOPIC-ADD ON

## 2011-02-12 LAB — ABO/RH: ABO/RH(D): O POS

## 2011-02-12 LAB — APTT: aPTT: 33

## 2011-02-15 ENCOUNTER — Emergency Department (HOSPITAL_COMMUNITY)
Admission: EM | Admit: 2011-02-15 | Discharge: 2011-02-16 | Disposition: A | Discharge status: Home / Self Care | Payer: Medicare Other | Attending: Emergency Medicine | Admitting: Emergency Medicine

## 2011-02-15 DIAGNOSIS — Z931 Gastrostomy status: Secondary | ICD-10-CM | POA: Insufficient documentation

## 2011-02-15 DIAGNOSIS — R109 Unspecified abdominal pain: Secondary | ICD-10-CM | POA: Insufficient documentation

## 2011-02-15 DIAGNOSIS — R110 Nausea: Secondary | ICD-10-CM | POA: Insufficient documentation

## 2011-02-15 DIAGNOSIS — K219 Gastro-esophageal reflux disease without esophagitis: Secondary | ICD-10-CM | POA: Insufficient documentation

## 2011-02-15 DIAGNOSIS — Z79899 Other long term (current) drug therapy: Secondary | ICD-10-CM | POA: Insufficient documentation

## 2011-02-15 DIAGNOSIS — E119 Type 2 diabetes mellitus without complications: Secondary | ICD-10-CM | POA: Insufficient documentation

## 2011-02-15 DIAGNOSIS — R10816 Epigastric abdominal tenderness: Secondary | ICD-10-CM | POA: Insufficient documentation

## 2011-02-15 DIAGNOSIS — I10 Essential (primary) hypertension: Secondary | ICD-10-CM | POA: Insufficient documentation

## 2011-02-15 IMAGING — CR DG ABDOMEN 1V
1 series · 1 of 1 positions shown · non-contrast
Comparison: 08/11/2009.

CLINICAL DATA: Nausea and vomiting with abdominal pain.

ABDOMEN - 1 VIEW

[t abdomen supine]
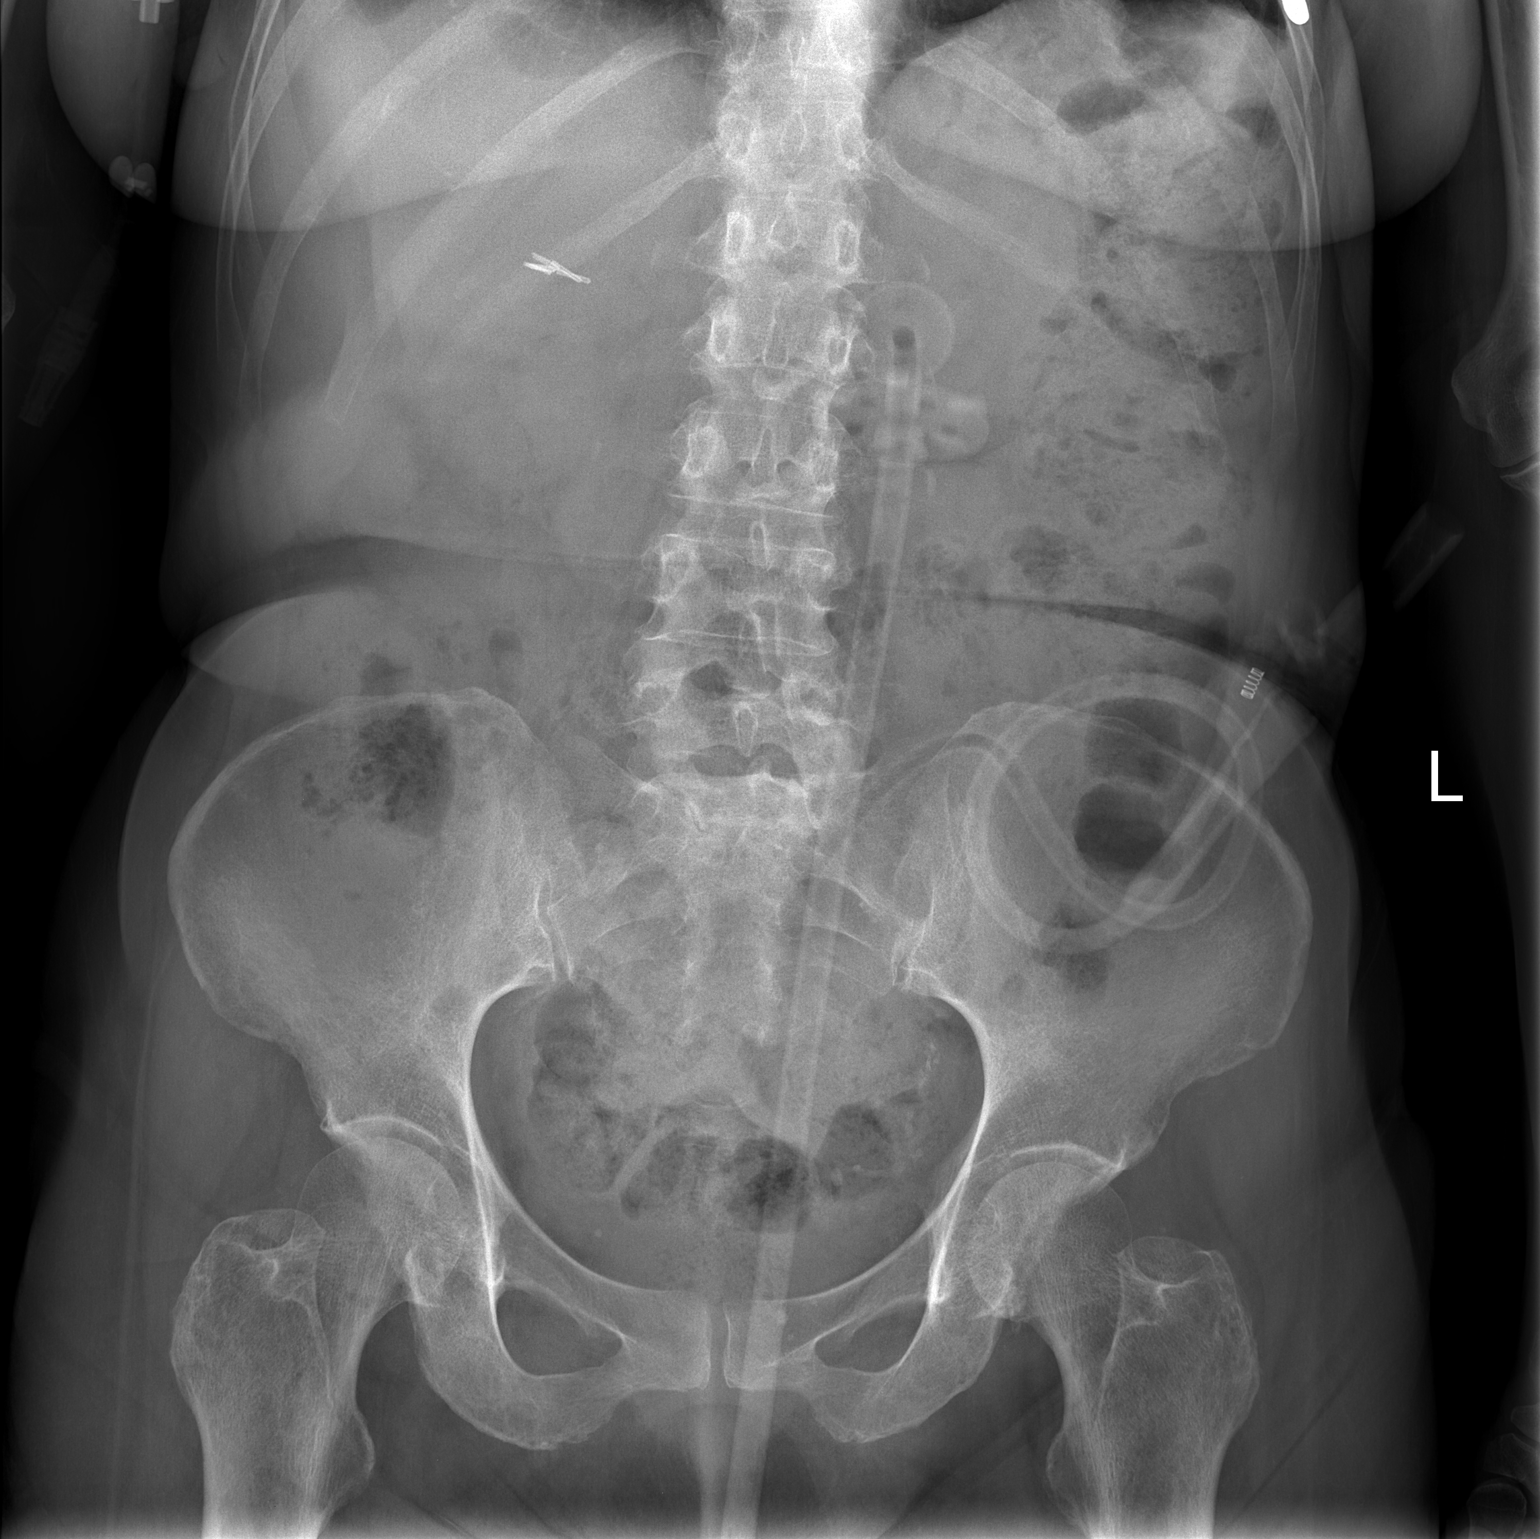

[1 of 1 positions shown; findings below may reference images not displayed]

FINDINGS: Peg tube medial aspect left upper quadrant.  Tube curled
over the left lower abdomen.  Moderate stool throughout the colon.
No plain film evidence of bowel obstruction. The possibility of
free intraperitoneal air cannot be addressed on a supine view.
Prior cholecystectomy.
IMPRESSION: No plain film evidence of bowel obstruction.

## 2011-02-15 IMAGING — CR DG CHEST 2V
2 series · 2 of 2 positions shown · non-contrast
Comparison: 05/11/2009 and 06/29/2007.  chest x-ray.  05/12/2009
chest CT.

CLINICAL DATA: Nausea and vomiting.

CHEST - 2 VIEW

[w chest pa]
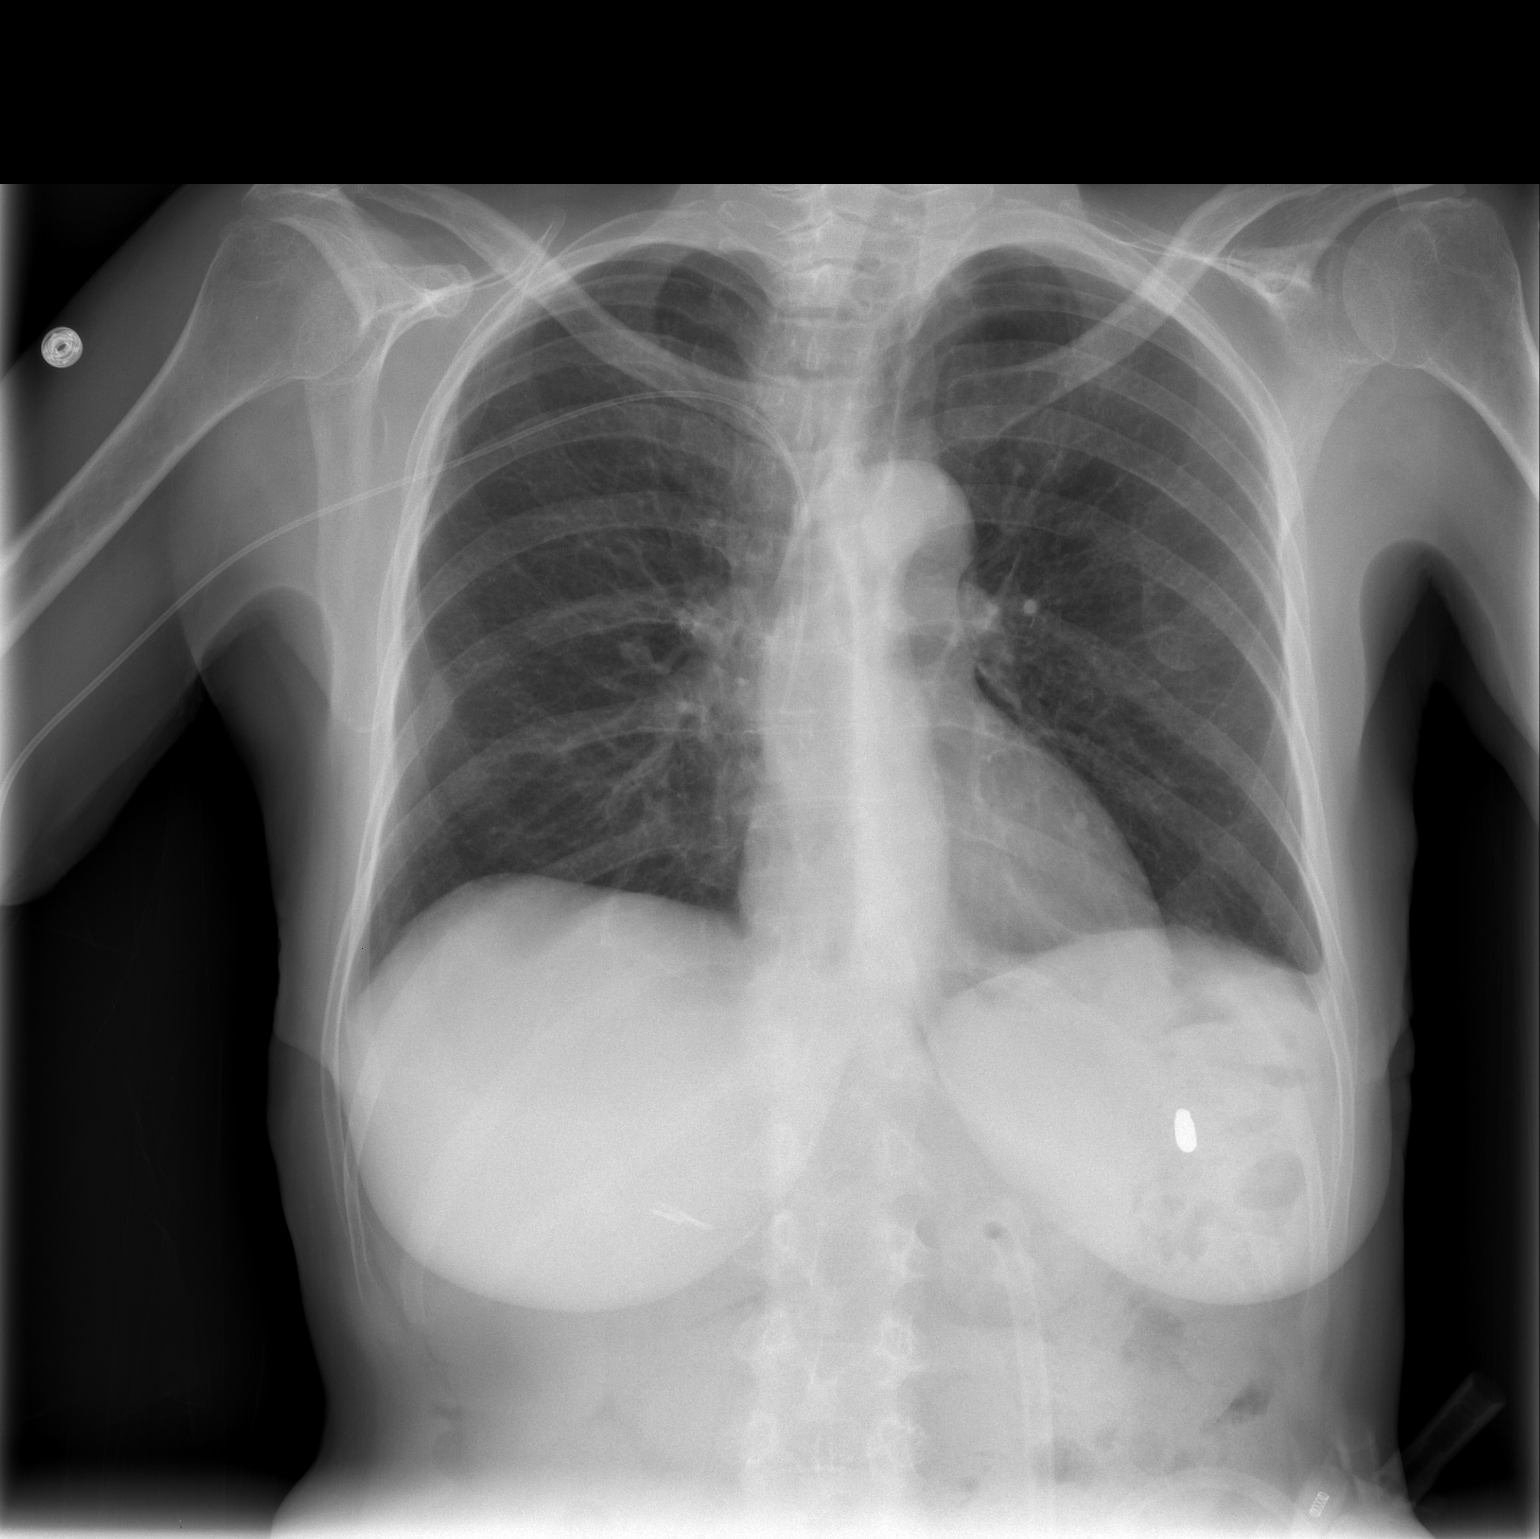

[w chest lat]
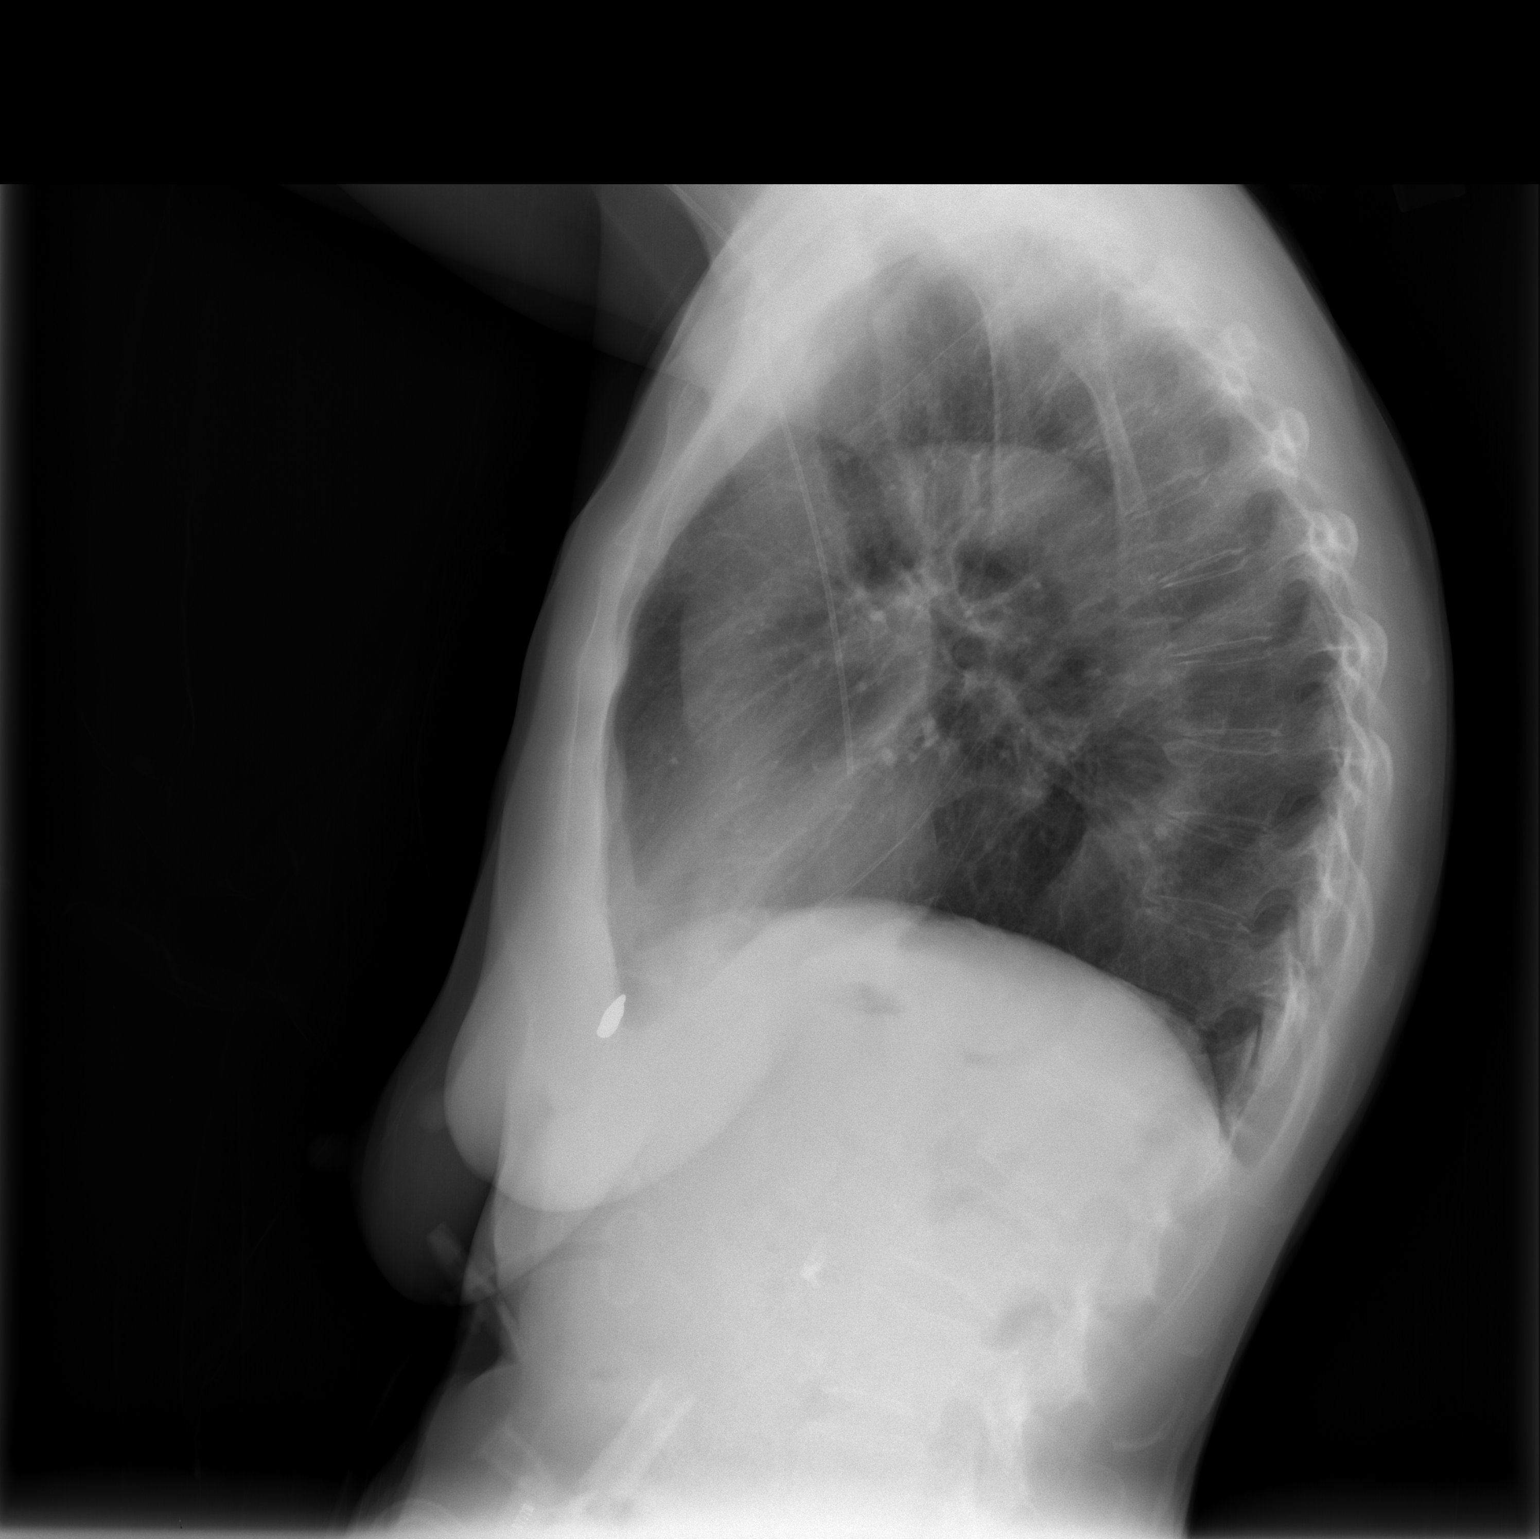

[2 of 2 positions shown; findings below may reference images not displayed]

FINDINGS: Right PICC line tip distal superior vena cava.  Mild
dextroscoliosis with slight rotation of the mediastinum. No
infiltrate, congestive heart failure or pneumothorax.  Heart size
within normal limits. Radiopaque structure within the left breast
unchanged.  Mild thoracic scoliosis and degenerative changes.
Aorta slightly tortuous.
IMPRESSION: No infiltrate, congestive heart failure or pneumothorax.

## 2011-02-16 ENCOUNTER — Other Ambulatory Visit: Payer: Self-pay | Admitting: Internal Medicine

## 2011-02-16 ENCOUNTER — Emergency Department (HOSPITAL_COMMUNITY): Payer: Medicare Other

## 2011-02-16 LAB — CBC
HCT: 37.1 % (ref 36.0–46.0)
Hemoglobin: 12.6 g/dL (ref 12.0–15.0)
MCH: 26.8 pg (ref 26.0–34.0)
MCHC: 34 g/dL (ref 30.0–36.0)
MCV: 78.8 fL (ref 78.0–100.0)
Platelets: 352 10*3/uL (ref 150–400)
RBC: 4.71 MIL/uL (ref 3.87–5.11)
RDW: 12.3 % (ref 11.5–15.5)
WBC: 12.5 10*3/uL — ABNORMAL HIGH (ref 4.0–10.5)

## 2011-02-16 LAB — DIFFERENTIAL
Basophils Absolute: 0 10*3/uL (ref 0.0–0.1)
Basophils Relative: 0 % (ref 0–1)
Eosinophils Absolute: 0.2 10*3/uL (ref 0.0–0.7)
Eosinophils Relative: 1 % (ref 0–5)
Lymphocytes Relative: 19 % (ref 12–46)
Lymphs Abs: 2.4 10*3/uL (ref 0.7–4.0)
Monocytes Absolute: 0.8 10*3/uL (ref 0.1–1.0)
Monocytes Relative: 6 % (ref 3–12)
Neutro Abs: 9.1 10*3/uL — ABNORMAL HIGH (ref 1.7–7.7)
Neutrophils Relative %: 73 % (ref 43–77)

## 2011-02-16 LAB — COMPREHENSIVE METABOLIC PANEL
ALT: 14 U/L (ref 0–35)
AST: 21 U/L (ref 0–37)
Albumin: 3.7 g/dL (ref 3.5–5.2)
Alkaline Phosphatase: 125 U/L — ABNORMAL HIGH (ref 39–117)
BUN: 15 mg/dL (ref 6–23)
CO2: 27 mEq/L (ref 19–32)
Calcium: 9.4 mg/dL (ref 8.4–10.5)
Chloride: 98 mEq/L (ref 96–112)
Creatinine, Ser: 0.57 mg/dL (ref 0.50–1.10)
GFR calc Af Amer: >90 mL/min (ref >90)
GFR calc non Af Amer: >90 mL/min (ref >90)
Glucose, Bld: 104 mg/dL — ABNORMAL HIGH (ref 70–99)
Potassium: 2.9 mEq/L — ABNORMAL LOW (ref 3.5–5.1)
Sodium: 141 mEq/L (ref 135–145)
Total Bilirubin: 0.2 mg/dL — ABNORMAL LOW (ref 0.3–1.2)
Total Protein: 7.7 g/dL (ref 6.0–8.3)

## 2011-02-16 LAB — LIPASE, BLOOD: Lipase: 86 U/L — ABNORMAL HIGH (ref 11–59)

## 2011-02-16 LAB — ETHANOL: Alcohol, Ethyl (B): 133 mg/dL — ABNORMAL HIGH (ref 0–11)

## 2011-02-17 IMAGING — RF DG UGI W/ SMALL BOWEL
14 of 24 series · 14 of 24 positions shown · IV contrast (agent unspecified)
Comparison: 09/25/2009, 05/12/2009

CLINICAL DATA: Dehydration.  Nausea, vomiting for 1 week.  History
of acid reflux, gastroparesis.  Patient has had a
gastrojejunostomy.  The patient has a J tube and gastrostomy tube.
The patient has had endoscopy on multiple prior occasions.

UPPER GI W/ SMALL BOWEL HIGH DENSITY
TECHNIQUE: Upper GI series performed with high density barium and
effervescent agent. Thin barium also used.  Subsequently, serial
images of the small bowel were obtained including spot views of the
terminal ileum.
Fluoroscopy Time:
Contrast: Single contrast barium

[Series 1: run · 1 of 1 slices shown (1 of 14)]
[im 1/1]
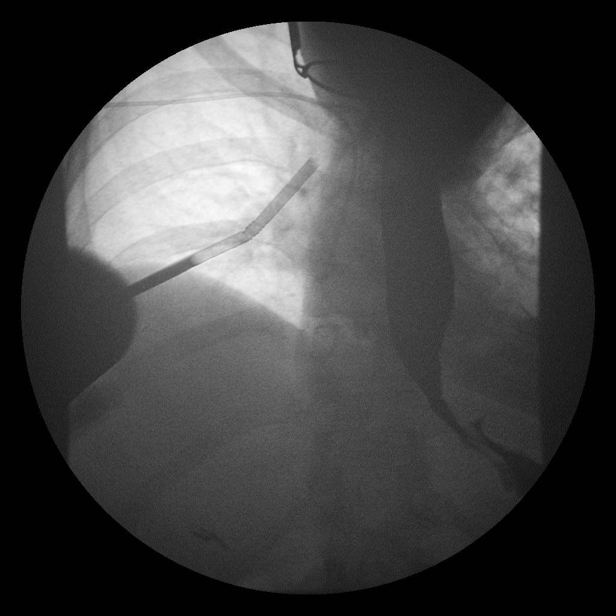

[Series 3: run · 1 of 1 slices shown (2 of 14)]
[im 1/1]
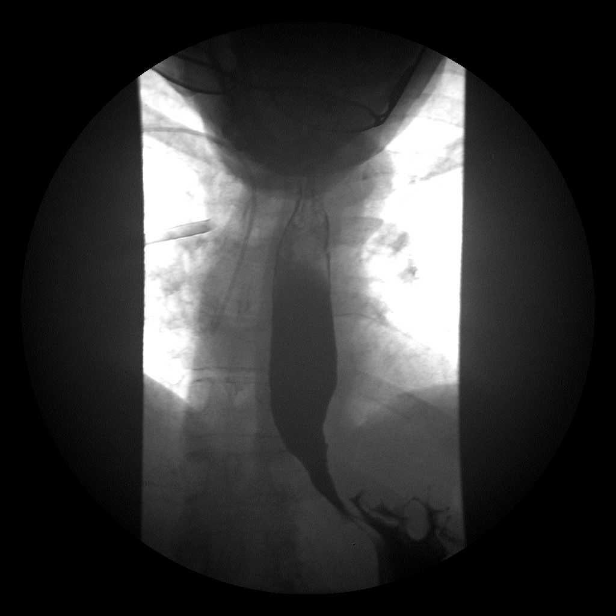

[Series 5: run · 1 of 1 slices shown (3 of 14)]
[im 1/1]
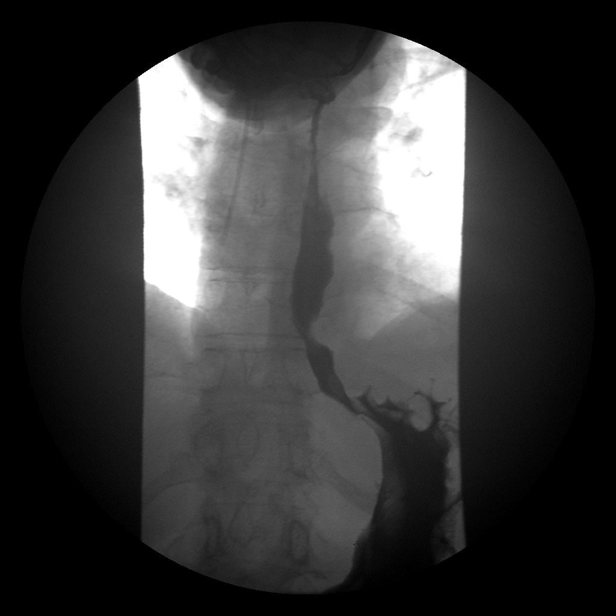

[Series 7: run · 1 of 1 slices shown (4 of 14)]
[im 1/1]
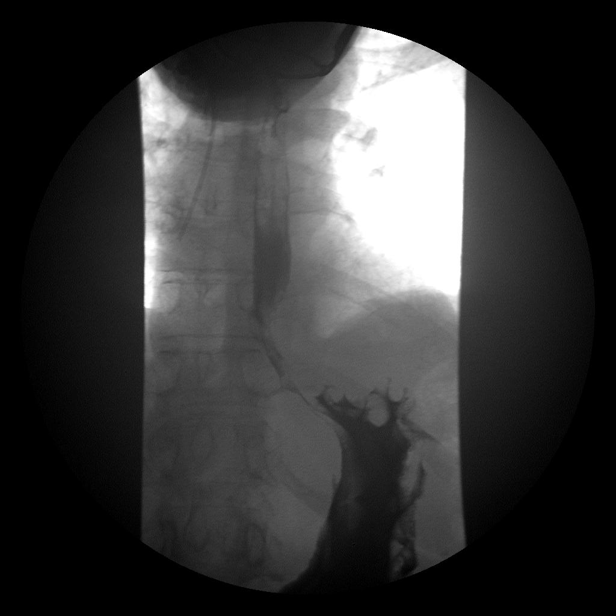

[Series 8: run · 1 of 1 slices shown (5 of 14)]
[im 1/1]
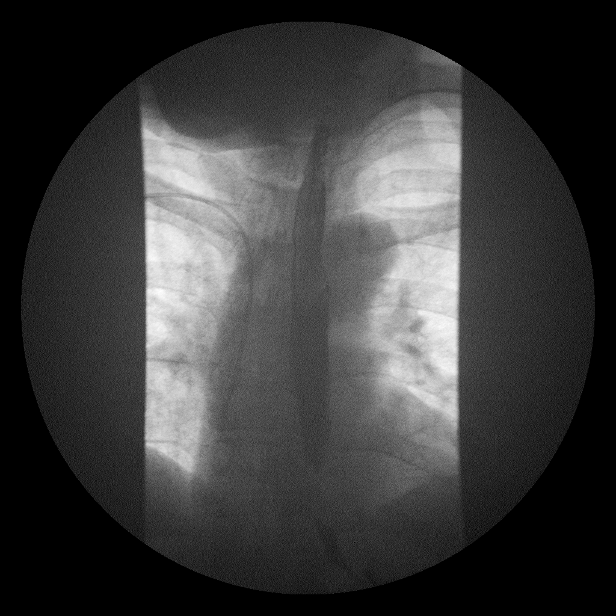

[Series 10: run · 1 of 1 slices shown (6 of 14)]
[im 1/1]
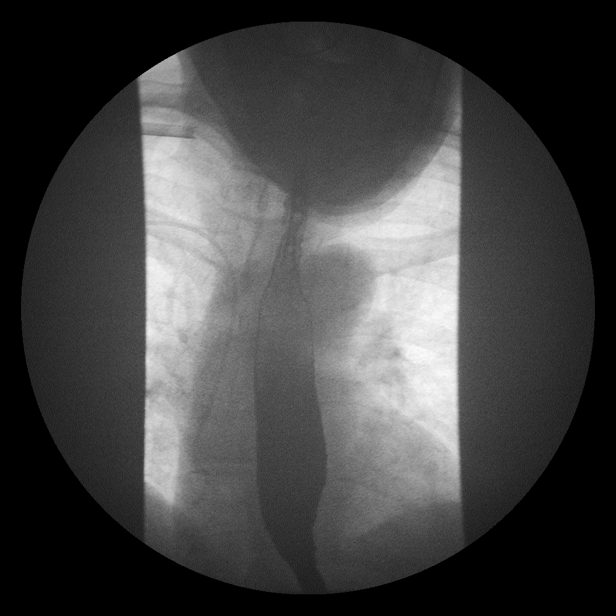

[Series 12: run · 1 of 1 slices shown (7 of 14)]
[im 1/1]
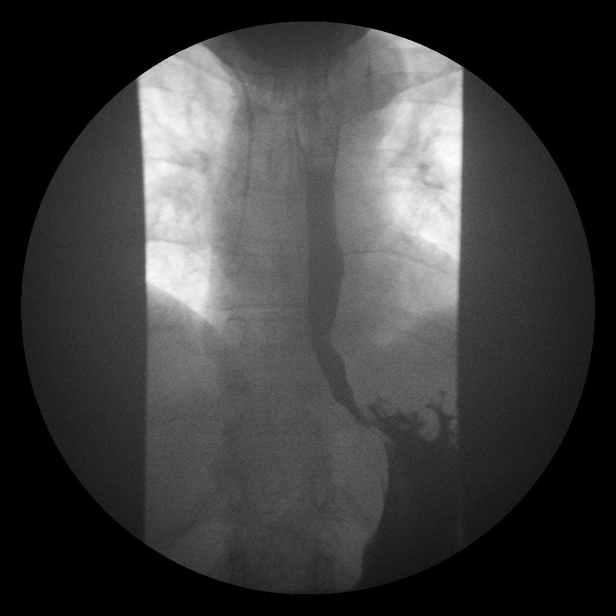

[Series 13: run · 1 of 1 slices shown (8 of 14)]
[im 1/1]
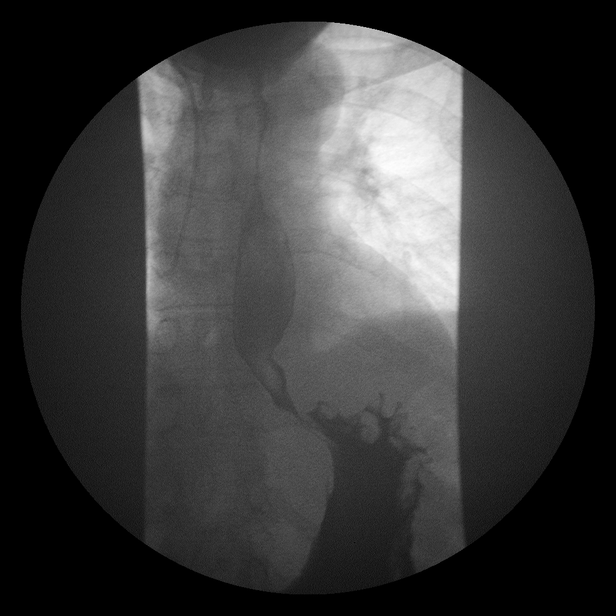

[Series 15: run · 1 of 1 slices shown (9 of 14)]
[im 1/1]
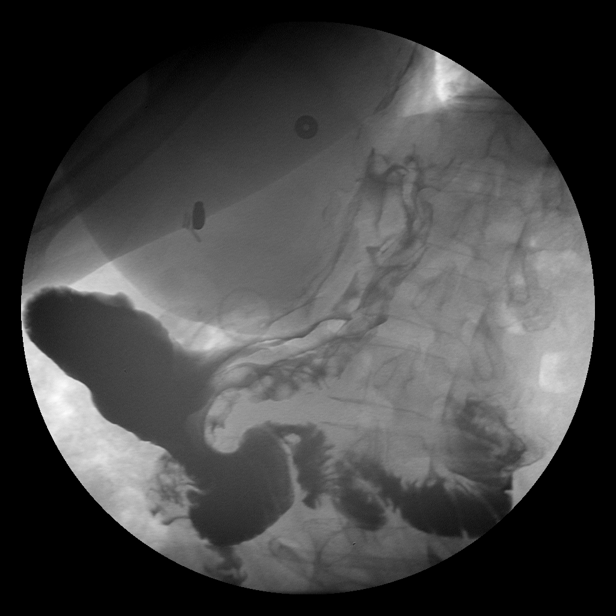

[Series 17: run · 1 of 1 slices shown (10 of 14)]
[im 1/1]
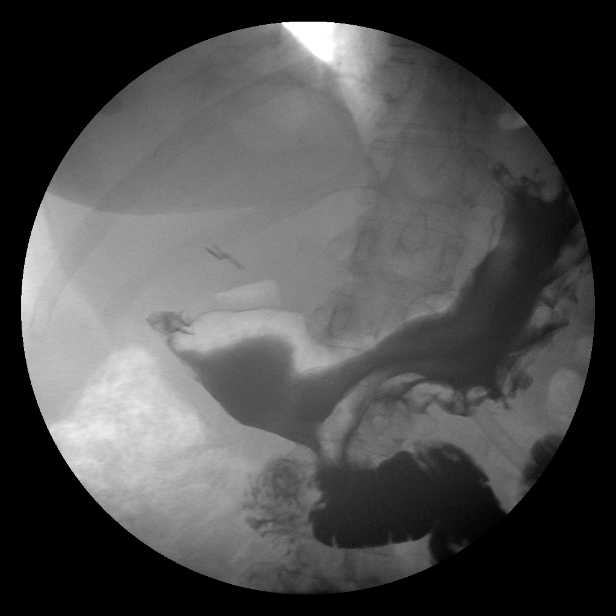

[Series 19: run · 1 of 1 slices shown (11 of 14)]
[im 1/1]
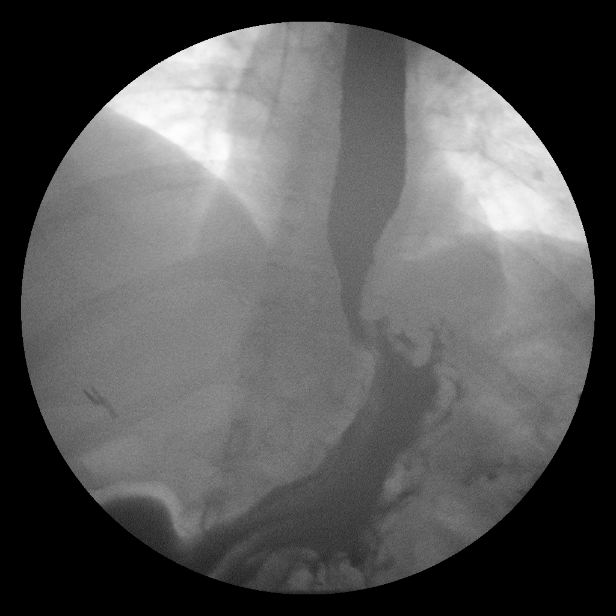

[Series 20: run · 1 of 1 slices shown (12 of 14)]
[im 1/1]
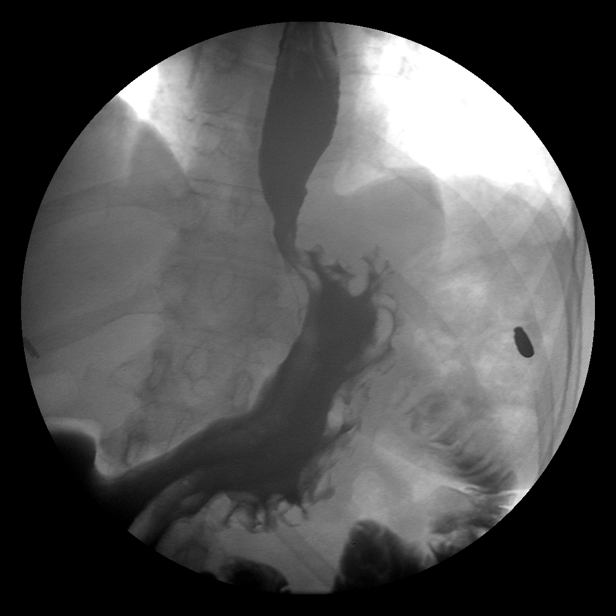

[Series 22: run · 1 of 1 slices shown (13 of 14)]
[im 1/1]
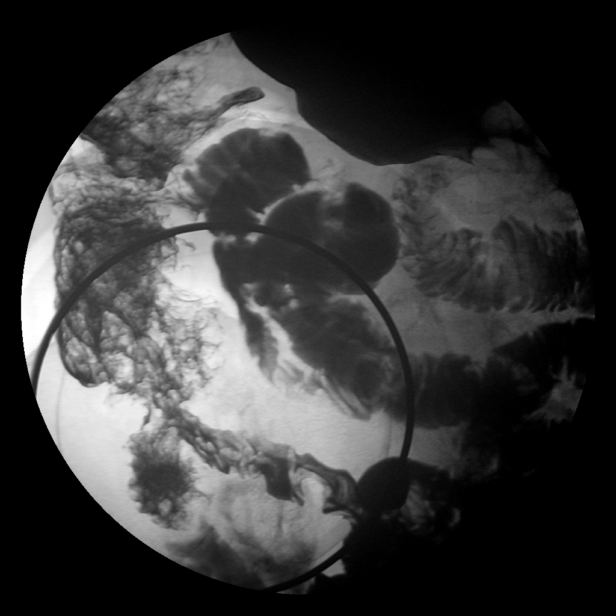

[Series 24: run · 1 of 1 slices shown (14 of 14)]
[im 1/1]
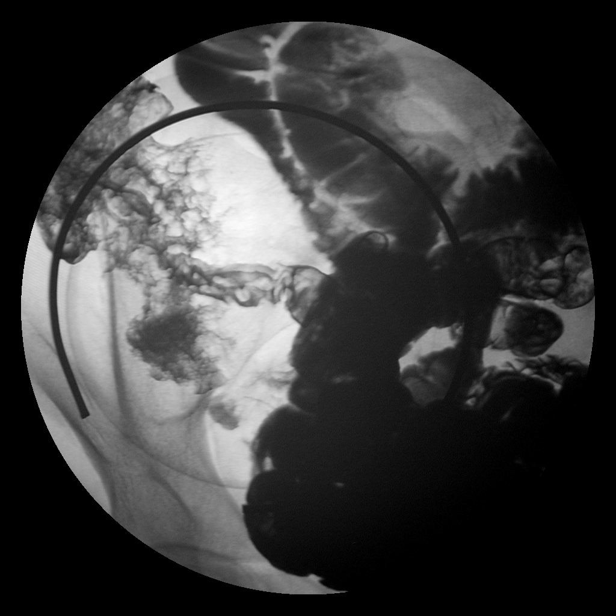

[14 of 24 positions shown; findings below may reference images not displayed]

FINDINGS: Scout film shows a gastrostomy tube in the left upper
quadrant.  Jejunostomy tube is identified in the left mid abdomen.
Surgical clips are present in the right upper quadrant.  There is
moderate stool throughout nondilated loops of colon.  No dilated
loops are identified.  No evidence for free intraperitoneal air.
Degenerative changes are seen in the spine.

With administration of contrast, dedicated cervical views show no
mucosal abnormality or evidence for reflux/aspiration.  Esophageal
motility appears normal.  The lower esophagus appears somewhat
narrowed and fixed but smooth.  Gastroesophageal junction appears
narrowed on all images.

The gastric folds are markedly thickened.  There is an extrinsic
impression along the greater curvature of the stomach just proximal
to the gastrojejunostomy anastomoses.  The appearance may be
postsurgical.  The blind ending pyloric region is unremarkable in
appearance.

Contrast fills the jejunal loops as early as 10 minutes and exam.
There is delayed opacification of large bowel loops, not seen until
5 hours after initial administration of contrast.  Spot compression
views show normal appearance of the terminal ileum and small bowel
loops.
IMPRESSION: 1.  No evidence for esophageal erosions.
2.  Smooth narrowing of the lower esophagus at the esophagogastric
junction.
3.  Prominent rugal fold pattern.
4.  Extrinsic compression along the greater curvature of the
stomach proximal to the anastomoses with the jejunum may be
postsurgical.
5.  Delayed opacification of large bowel without evidence for
gastroparesis suggest delayed small bowel motility.

## 2011-02-18 DIAGNOSIS — K3184 Gastroparesis: Secondary | ICD-10-CM | POA: Insufficient documentation

## 2011-02-19 ENCOUNTER — Other Ambulatory Visit: Payer: Self-pay | Admitting: Internal Medicine

## 2011-02-25 ENCOUNTER — Telehealth: Payer: Self-pay | Admitting: Internal Medicine

## 2011-02-25 NOTE — Telephone Encounter (Signed)
Patient calling to report she is sore around the feeding tube site and has pain in her stomach. States she went to the hospital on 02/16/11 with pain and was told it was probably due to her drinking beer. She states the pain she has now is at the feeding tube site and all across her abdomen. Denies drainage around the tube and the tube is functioning well. She did drink beer yesterday but denies drinking any today. States it hurts when she stands and she has to bend over. Please, advise.

## 2011-02-25 NOTE — Telephone Encounter (Signed)
Please make sure she takes Carafate 1gm po gid x 24 hrs, and her PPI till pain subsides

## 2011-02-26 NOTE — Telephone Encounter (Signed)
Spoke with patient and she will increase her Carafate. She did not get the Prilosec rx filled from the ER but she is going this AM and get it filled and start taking it.

## 2011-03-08 IMAGING — CT CT ABD-PELV W/ CM
1 of 2 series · 14 of 32 positions shown, 18 images · IV contrast (agent unspecified)
Comparison: 05/12/2009

CLINICAL DATA: Abdominal pain.  Vomiting.  History of Nissen
fundoplication, cholecystectomy, and previous small bowel
obstruction.

CT ABDOMEN AND PELVIS WITH CONTRAST
TECHNIQUE: Multidetector CT imaging of the abdomen and pelvis was
performed following the standard protocol during bolus
administration of intravenous contrast.
Contrast: 100 ml [id number]-[id number]

[Series 2: rtn ap with st · axial · 0.59mm/px · z∈[-336,+34]mm · 14 of 82 slices shown, 18 images]
[im 4/82  soft-tissue]
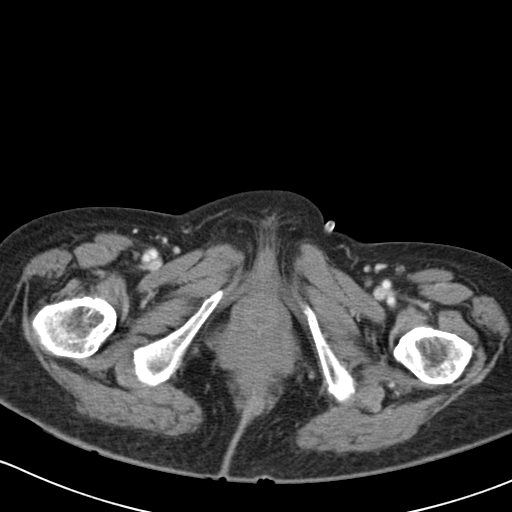
[im 4/82  bone]
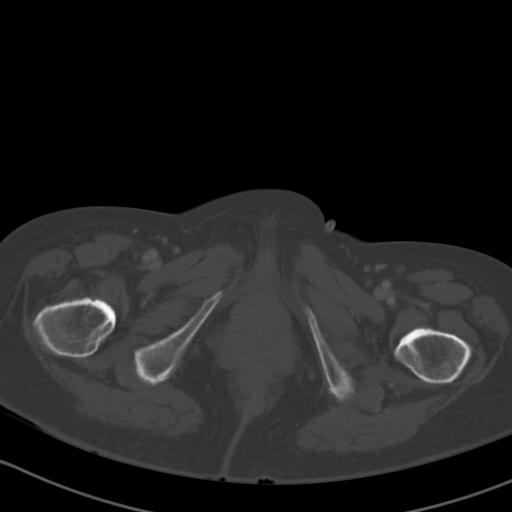
[im 11/82  soft-tissue]
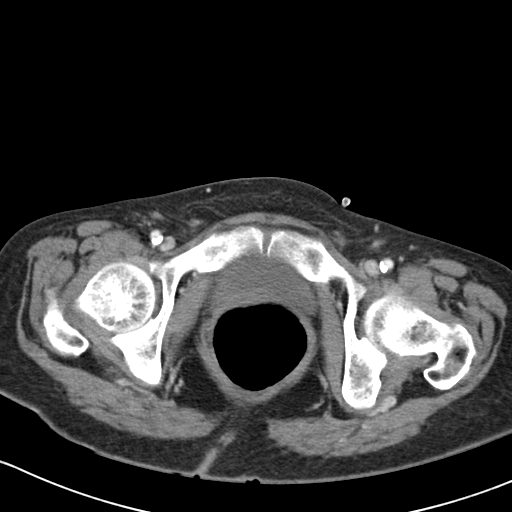
[im 18/82  soft-tissue]
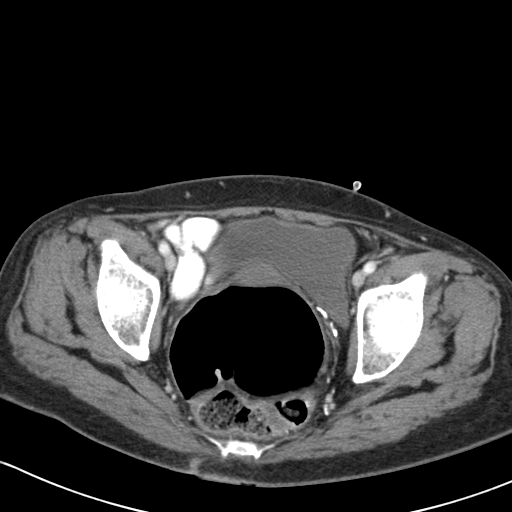
[im 25/82  soft-tissue]
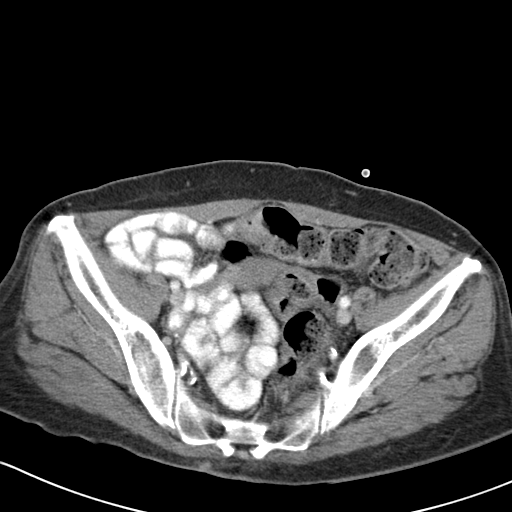
[im 32/82  soft-tissue]
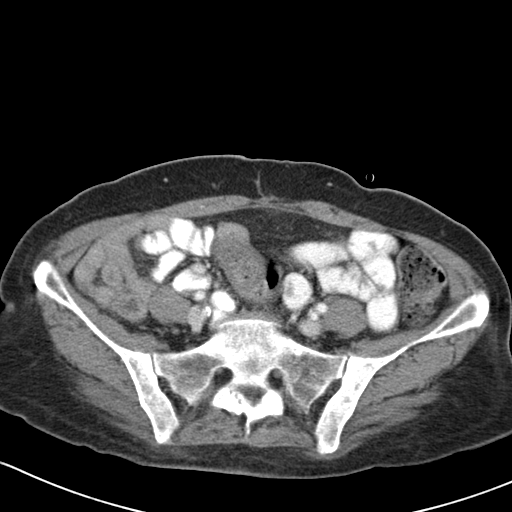
[im 39/82  soft-tissue]
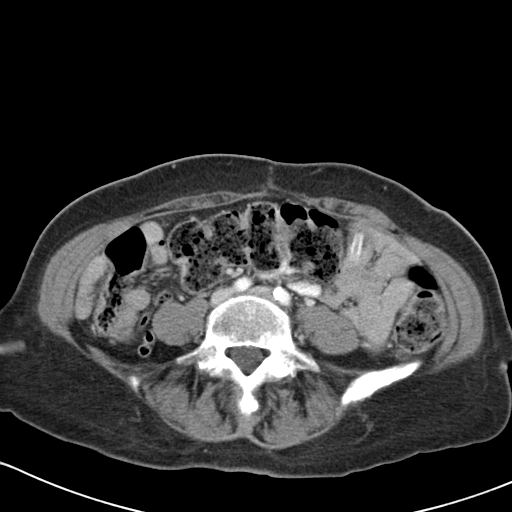
[im 43/82  soft-tissue]
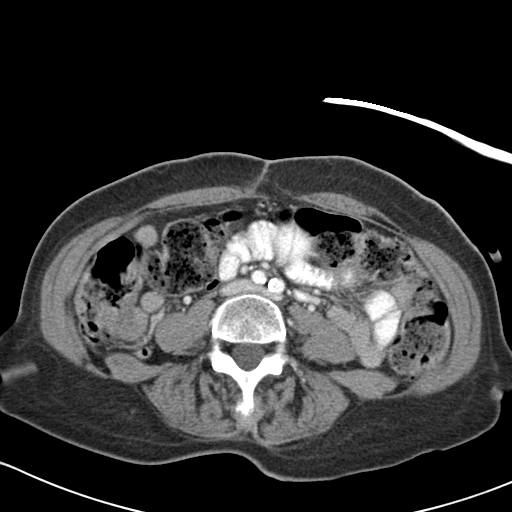
[im 50/82  soft-tissue]
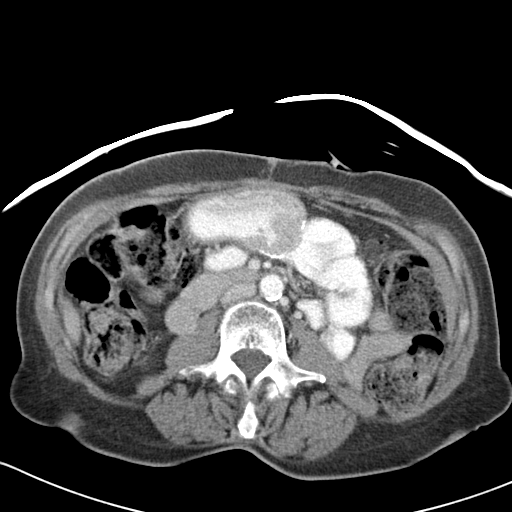
[im 57/82  soft-tissue]
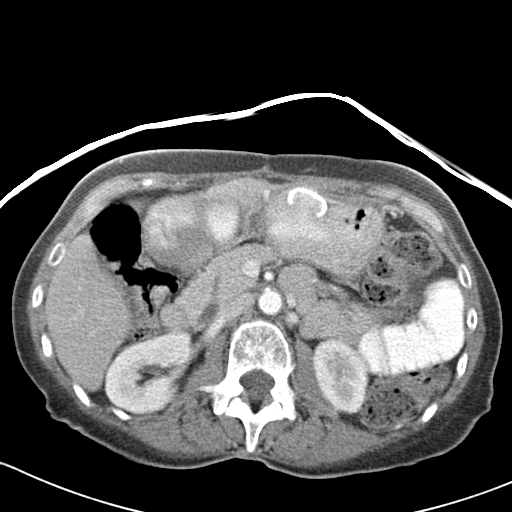
[im 57/82  bone]
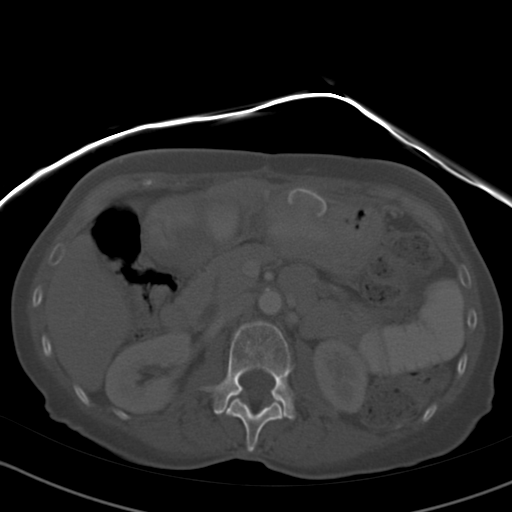
[im 64/82  soft-tissue]
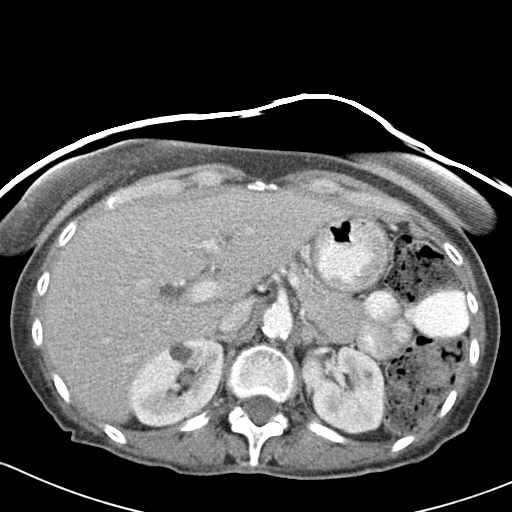
[im 67/82  lung]
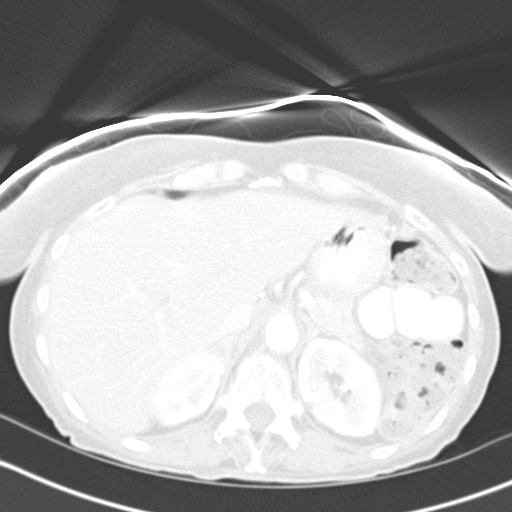
[im 71/82  soft-tissue]
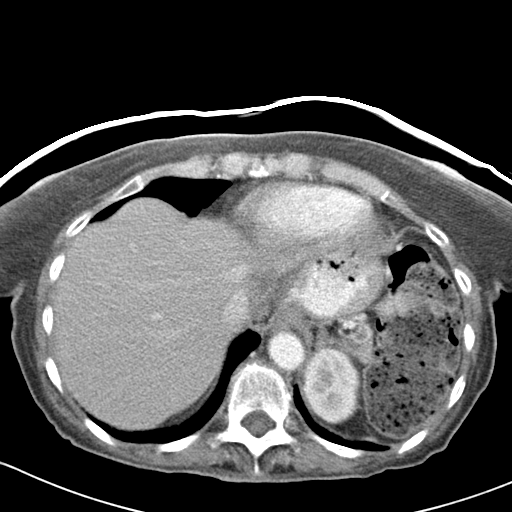
[im 71/82  lung]
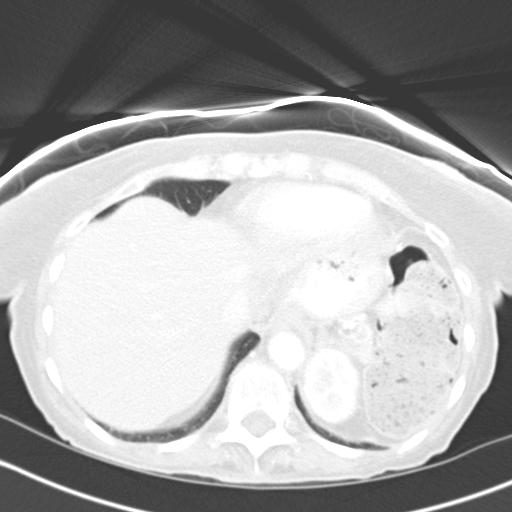
[im 74/82  lung]
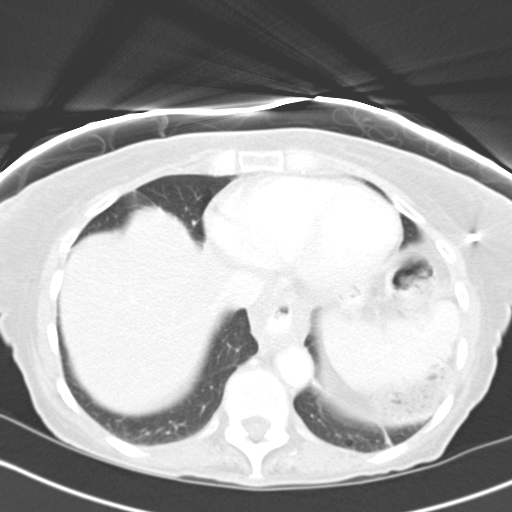
[im 78/82  soft-tissue]
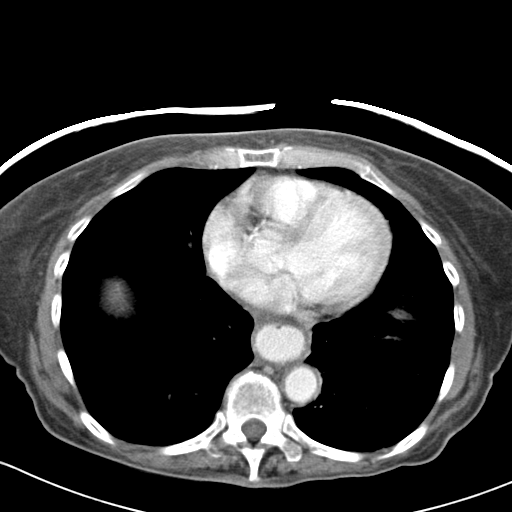
[im 78/82  lung]
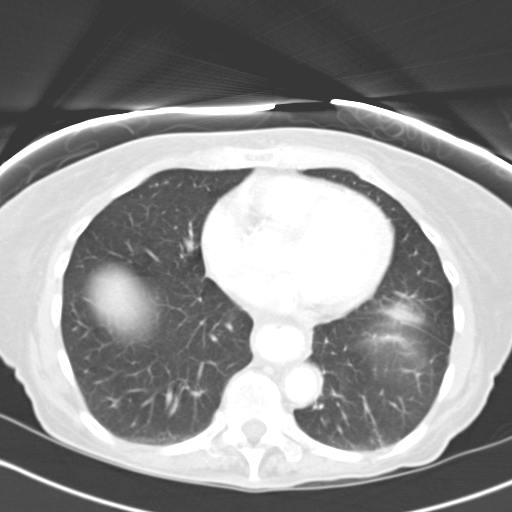

[14 of 32 positions shown; findings below may reference images not displayed]

FINDINGS: The 8 mm right middle lobe pulmonary nodule seen on the
previous study is not seen today because this area of the lung is
not included on today's exam.  2 mm right lower lobe pulmonary
nodule on image 2 is unchanged.

No focal abnormalities seen in the liver or spleen.  Mild
intrahepatic biliary duct dilatation is evident.  Extrahepatic
common duct measures 11 mm in diameter, increased from 7 mm
previously.  Common bile duct in the pancreatic head is 6 mm in
diameter, upper normal to borderline increased.

The distal esophagus is patulous and contains contrast material
suggesting reflux or esophageal dysmotility.  Soft tissue
prominence in the region of the esophagogastric junction is
presumably from the history of fundoplication although esophagitis
or neoplasm could have this appearance.  Gastrostomy tube is noted
in the distal stomach.  The patient has a gastrojejunostomy.

Gallbladder is surgically absent.  Adrenal glands are normal.
Small bilateral renal cortical cysts are stable.  No abdominal
aortic aneurysm.  No abdominal lymphadenopathy.

A large volume of stool is seen in the abdominal segments of the
colon.

No free fluid in the pelvis.  Bladder is unremarkable.  Uterus is
normal.  There is no adnexal mass.

The rectum is markedly distended with air.  There is prominent
stool in the sigmoid and descending colon.  A few scattered
diverticuli are seen in the left colon without diverticulitis.  The
appendix is normal.  The terminal ileum is normal.

Bone windows reveal no worrisome lytic or sclerotic osseous
lesions.
IMPRESSION: Interval development of mild extrahepatic biliary duct dilatation
in this patient status post cholecystectomy.  Correlation with LFTs
may prove helpful.

Large volume of stool throughout the length of the colon.  Imaging
features are compatible with clinical constipation.

Stable 2 mm right lower lobe pulmonary nodule.  The 8 mm pulmonary
nodule seen on the previous CT has not been included on this study.

## 2011-03-09 ENCOUNTER — Other Ambulatory Visit: Payer: Self-pay | Admitting: Internal Medicine

## 2011-03-09 IMAGING — US IR FLUORO GUIDE CV LINE*R*
1 series · 1 of 1 positions shown · non-contrast
Comparison: none

CLINICAL DATA: Gastroparesis

UPPER EXTREMITY PICC PLACEMENT WITH ULTRASOUND AND FLUORO GUIDANCE
TECHNIQUE: The right arm was prepped with chlorhexidine, draped in
the usual sterile fashion using maximum barrier technique and
infiltrated locally with 1% Lidocaine.  Ultrasound demonstrated
patency of the right basilic vein, and this was documented with an
image.  Under real-time ultrasound guidance, this vein was accessed
with a 21 gauge micropuncture needle and image documentation was
performed.  The needle was exchanged over a guidewire for a peel-
away sheath through which a five French double lumen PICC trimmed
to 41cm was advanced, positioned with its tip at the lower
SVC/right atrial junction.  Fluoroscopy during the procedure and
fluoro spot radiograph confirms appropriate catheter position.  The
catheter was flushed, secured to the skin with Prolene sutures, and
covered with a sterile dressing.  No immediate complication.
Fluoroscopy Time: 0.1 minutes.

[Series 1: sp us guide vasc access*right* · 1 of 1 slices shown]
[im 1/1]
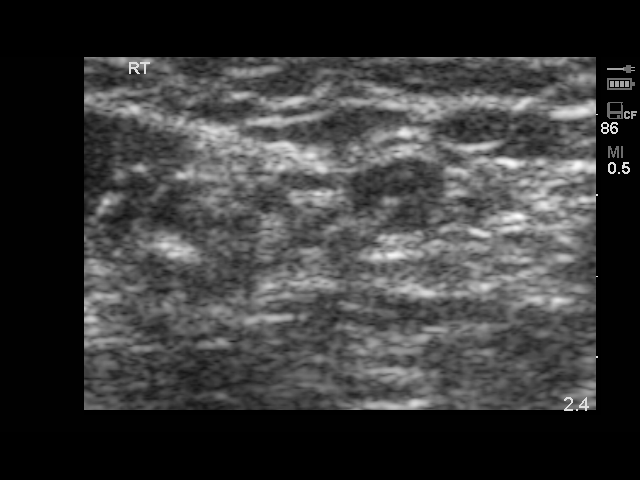

[1 of 1 positions shown; findings below may reference images not displayed]

IMPRESSION: Technically successful right arm PICC placement with ultrasound and
fluoroscopic guidance.  The catheter is ready for use.

## 2011-03-16 ENCOUNTER — Other Ambulatory Visit: Payer: Self-pay | Admitting: Internal Medicine

## 2011-03-20 ENCOUNTER — Other Ambulatory Visit: Payer: Self-pay | Admitting: Internal Medicine

## 2011-03-20 ENCOUNTER — Telehealth: Payer: Self-pay | Admitting: Internal Medicine

## 2011-03-20 NOTE — Telephone Encounter (Signed)
Last OV 10/2010, may have refills till the end of this year, then needs OV to refill.

## 2011-03-20 NOTE — Telephone Encounter (Signed)
Patient states that she needs Reglan filled in addition to the phenergan she requested earlier today. I will go ahead and send this. She also requests refills on Zofran. Last rx was given in 2011 on zofran from what I can see. Do you want her to have refills on this in addition to the phenergan suppositories, tablets and reglan?

## 2011-03-20 NOTE — Telephone Encounter (Signed)
Pt wanted to let us know she called the pharmacy for refills on her zofran. She wanted to make sure we refill the request. Let pt know we would take care of it when we received it from her pharmacy.

## 2011-03-21 IMAGING — CR DG ABDOMEN 1V
1 series · 1 of 1 positions shown · non-contrast
Comparison: The 10/16/2009

CLINICAL DATA: J tube reinserted.

ABDOMEN - 1 VIEW

[t abdomen supine]
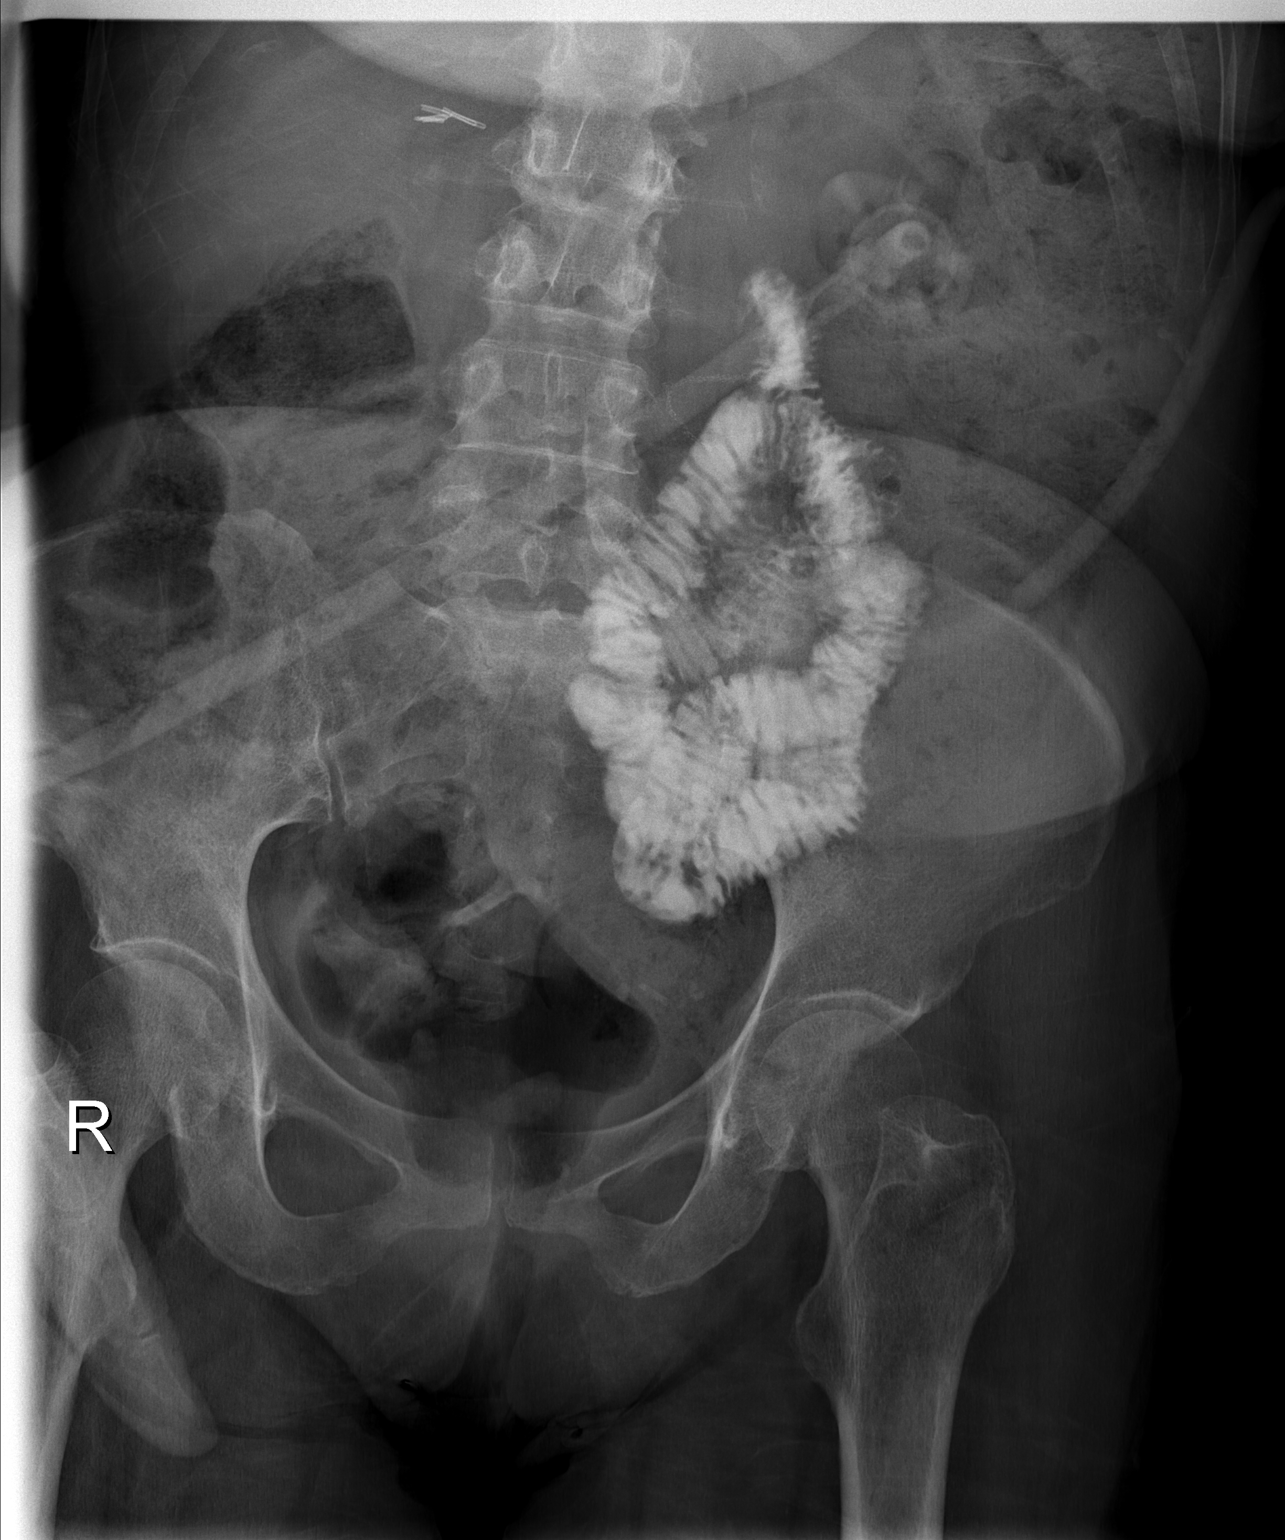

[1 of 1 positions shown; findings below may reference images not displayed]

FINDINGS: Contrast was injected through the patients feeding tube.
This opacifies small bowel loops.  No evidence of extravasation.
Normal bowel gas pattern.
IMPRESSION: Contrast injected through the feeding tube opacifies small bowel
loops.

## 2011-03-23 ENCOUNTER — Encounter: Payer: Self-pay | Admitting: *Deleted

## 2011-03-23 IMAGING — RF DG CM INJ ANY COLONIC TUBE W/FL
2 series · 2 of 2 positions shown · non-contrast
Comparison: none

CLINICAL DATA: Jejunal tube was replaced.  The study is an
evaluation of the jejunal tube.

GI TUBE INJECTION
Procedure: Water soluble contrast was injected into the jejunal
tube and images were obtained.

[Series 1: run · 1 of 1 slices shown (1 of 2)]
[im 1/1]
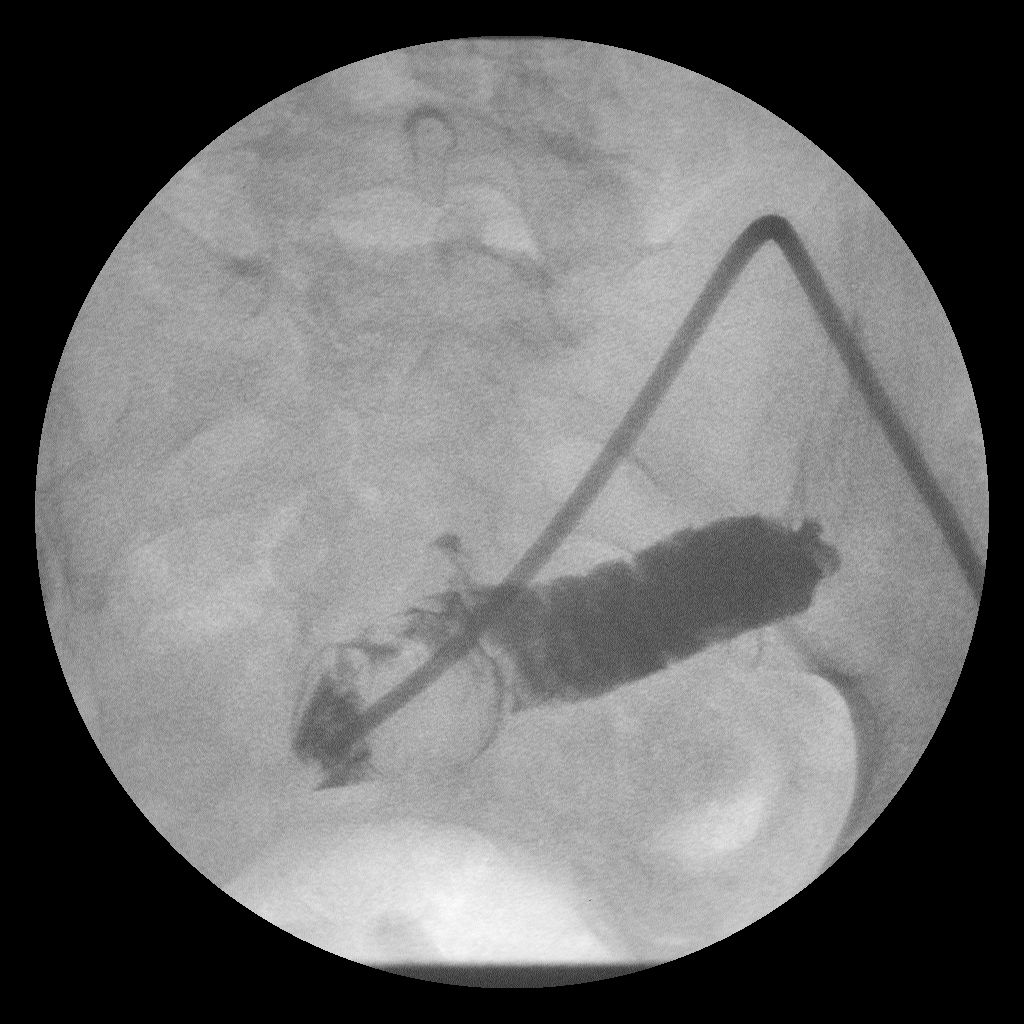

[Series 2: run · 1 of 1 slices shown (2 of 2)]
[im 1/1]
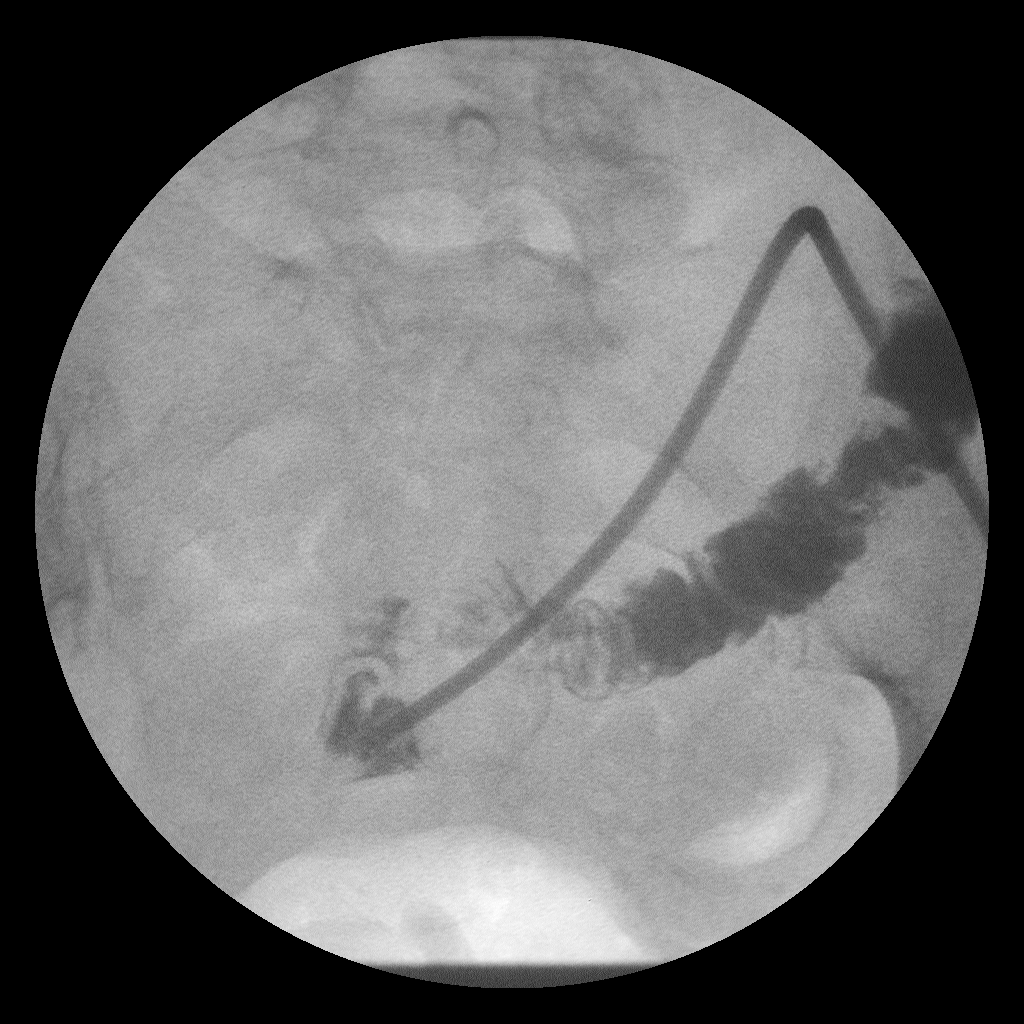

[2 of 2 positions shown; findings below may reference images not displayed]

FINDINGS: The tip of the jejunal tube is appropriately positioned
within the small bowel.  The balloon was insufflated.
IMPRESSION: Jejunal tube is appropriately positioned within small bowel.

## 2011-03-23 MED ORDER — ONDANSETRON HCL 4 MG PO TABS: 4.0000 mg | ORAL_TABLET | Freq: Two times a day (BID) | ORAL | Status: DC | PRN

## 2011-04-01 ENCOUNTER — Encounter: Payer: Self-pay | Admitting: Internal Medicine

## 2011-04-01 ENCOUNTER — Other Ambulatory Visit: Payer: Medicare Other

## 2011-04-01 ENCOUNTER — Ambulatory Visit (INDEPENDENT_AMBULATORY_CARE_PROVIDER_SITE_OTHER): Payer: Medicare Other | Admitting: Internal Medicine

## 2011-04-01 DIAGNOSIS — K3184 Gastroparesis: Secondary | ICD-10-CM

## 2011-04-01 DIAGNOSIS — K859 Acute pancreatitis without necrosis or infection, unspecified: Secondary | ICD-10-CM

## 2011-04-01 DIAGNOSIS — R6889 Other general symptoms and signs: Secondary | ICD-10-CM

## 2011-04-01 DIAGNOSIS — K219 Gastro-esophageal reflux disease without esophagitis: Secondary | ICD-10-CM

## 2011-04-01 MED ORDER — LORAZEPAM 0.5 MG PO TABS: 0.5000 mg | ORAL_TABLET | Freq: Three times a day (TID) | ORAL | Status: DC

## 2011-04-01 NOTE — Patient Instructions (Addendum)
We have sent the following medications to your pharmacy for you to pick up at your convenience: Lorazepam Your physician has requested that you go to the basement for the following lab work before leaving today: IBC, CBC, B12 level CC:  Georganna Skeans
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
## 2011-04-02 ENCOUNTER — Ambulatory Visit (AMBULATORY_SURGERY_CENTER): Payer: Medicare Other | Admitting: Internal Medicine

## 2011-04-02 ENCOUNTER — Encounter: Payer: Self-pay | Admitting: Internal Medicine

## 2011-04-02 DIAGNOSIS — R1013 Epigastric pain: Secondary | ICD-10-CM

## 2011-04-02 DIAGNOSIS — Z903 Acquired absence of stomach [part of]: Secondary | ICD-10-CM

## 2011-04-02 DIAGNOSIS — R1310 Dysphagia, unspecified: Secondary | ICD-10-CM

## 2011-04-02 DIAGNOSIS — K9420 Gastrostomy complication, unspecified: Secondary | ICD-10-CM

## 2011-04-02 DIAGNOSIS — Z9889 Other specified postprocedural states: Secondary | ICD-10-CM

## 2011-04-02 DIAGNOSIS — K219 Gastro-esophageal reflux disease without esophagitis: Secondary | ICD-10-CM

## 2011-04-02 DIAGNOSIS — K3184 Gastroparesis: Secondary | ICD-10-CM

## 2011-04-02 LAB — GLUCOSE, CAPILLARY
Glucose-Capillary: 131 mg/dL — ABNORMAL HIGH (ref 70–99)
Glucose-Capillary: 108 mg/dL — ABNORMAL HIGH (ref 70–99)

## 2011-04-02 MED ORDER — SODIUM CHLORIDE 0.9 % IV SOLN: 500.0000 mL | INTRAVENOUS | Status: DC
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
## 2011-04-02 NOTE — Patient Instructions (Signed)
Discharge instructions given with verbal understanding. Resume previous medications.  

## 2011-04-03 ENCOUNTER — Telehealth: Payer: Self-pay

## 2011-04-03 NOTE — Telephone Encounter (Signed)

## 2011-04-06 ENCOUNTER — Other Ambulatory Visit: Payer: Self-pay | Admitting: Internal Medicine

## 2011-04-09 ENCOUNTER — Emergency Department (HOSPITAL_COMMUNITY): Payer: Medicare Other

## 2011-04-09 ENCOUNTER — Encounter (HOSPITAL_COMMUNITY): Payer: Self-pay | Admitting: Emergency Medicine

## 2011-04-09 ENCOUNTER — Emergency Department (HOSPITAL_COMMUNITY)
Admission: EM | Admit: 2011-04-09 | Discharge: 2011-04-09 | Disposition: A | Discharge status: Home / Self Care | Payer: Medicare Other | Attending: Emergency Medicine | Admitting: Emergency Medicine

## 2011-04-09 DIAGNOSIS — M129 Arthropathy, unspecified: Secondary | ICD-10-CM | POA: Insufficient documentation

## 2011-04-09 DIAGNOSIS — S62607A Fracture of unspecified phalanx of left little finger, initial encounter for closed fracture: Secondary | ICD-10-CM

## 2011-04-09 DIAGNOSIS — Z9889 Other specified postprocedural states: Secondary | ICD-10-CM | POA: Insufficient documentation

## 2011-04-09 DIAGNOSIS — R109 Unspecified abdominal pain: Secondary | ICD-10-CM | POA: Insufficient documentation

## 2011-04-09 DIAGNOSIS — W108XXA Fall (on) (from) other stairs and steps, initial encounter: Secondary | ICD-10-CM | POA: Insufficient documentation

## 2011-04-09 DIAGNOSIS — F329 Major depressive disorder, single episode, unspecified: Secondary | ICD-10-CM | POA: Insufficient documentation

## 2011-04-09 DIAGNOSIS — I10 Essential (primary) hypertension: Secondary | ICD-10-CM | POA: Insufficient documentation

## 2011-04-09 DIAGNOSIS — IMO0002 Reserved for concepts with insufficient information to code with codable children: Secondary | ICD-10-CM | POA: Insufficient documentation

## 2011-04-09 DIAGNOSIS — M25439 Effusion, unspecified wrist: Secondary | ICD-10-CM | POA: Insufficient documentation

## 2011-04-09 DIAGNOSIS — K219 Gastro-esophageal reflux disease without esophagitis: Secondary | ICD-10-CM | POA: Insufficient documentation

## 2011-04-09 DIAGNOSIS — E119 Type 2 diabetes mellitus without complications: Secondary | ICD-10-CM | POA: Insufficient documentation

## 2011-04-09 DIAGNOSIS — S52599A Other fractures of lower end of unspecified radius, initial encounter for closed fracture: Secondary | ICD-10-CM | POA: Insufficient documentation

## 2011-04-09 DIAGNOSIS — F3289 Other specified depressive episodes: Secondary | ICD-10-CM | POA: Insufficient documentation

## 2011-04-09 DIAGNOSIS — F17200 Nicotine dependence, unspecified, uncomplicated: Secondary | ICD-10-CM | POA: Insufficient documentation

## 2011-04-09 DIAGNOSIS — M25539 Pain in unspecified wrist: Secondary | ICD-10-CM | POA: Insufficient documentation

## 2011-04-09 DIAGNOSIS — S52509A Unspecified fracture of the lower end of unspecified radius, initial encounter for closed fracture: Secondary | ICD-10-CM

## 2011-04-09 DIAGNOSIS — Z79899 Other long term (current) drug therapy: Secondary | ICD-10-CM | POA: Insufficient documentation

## 2011-04-09 DIAGNOSIS — M79609 Pain in unspecified limb: Secondary | ICD-10-CM | POA: Insufficient documentation

## 2011-04-09 LAB — CBC
HCT: 37.2 % (ref 36.0–46.0)
Hemoglobin: 12.6 g/dL (ref 12.0–15.0)
MCH: 26 pg (ref 26.0–34.0)
MCHC: 33.9 g/dL (ref 30.0–36.0)
MCV: 76.7 fL — ABNORMAL LOW (ref 78.0–100.0)
Platelets: 362 10*3/uL (ref 150–400)
RBC: 4.85 MIL/uL (ref 3.87–5.11)
RDW: 12.3 % (ref 11.5–15.5)
WBC: 12.2 10*3/uL — ABNORMAL HIGH (ref 4.0–10.5)

## 2011-04-09 LAB — DIFFERENTIAL
Basophils Absolute: 0 10*3/uL (ref 0.0–0.1)
Basophils Relative: 0 % (ref 0–1)
Eosinophils Absolute: 0 10*3/uL (ref 0.0–0.7)
Eosinophils Relative: 0 % (ref 0–5)
Lymphocytes Relative: 10 % — ABNORMAL LOW (ref 12–46)
Lymphs Abs: 1.3 10*3/uL (ref 0.7–4.0)
Monocytes Absolute: 0.9 10*3/uL (ref 0.1–1.0)
Monocytes Relative: 7 % (ref 3–12)
Neutro Abs: 10 10*3/uL — ABNORMAL HIGH (ref 1.7–7.7)
Neutrophils Relative %: 82 % — ABNORMAL HIGH (ref 43–77)

## 2011-04-09 LAB — COMPREHENSIVE METABOLIC PANEL
ALT: 19 U/L (ref 0–35)
AST: 33 U/L (ref 0–37)
Albumin: 3.5 g/dL (ref 3.5–5.2)
Alkaline Phosphatase: 125 U/L — ABNORMAL HIGH (ref 39–117)
BUN: 16 mg/dL (ref 6–23)
CO2: 31 mEq/L (ref 19–32)
Calcium: 9.4 mg/dL (ref 8.4–10.5)
Chloride: 93 mEq/L — ABNORMAL LOW (ref 96–112)
Creatinine, Ser: 0.62 mg/dL (ref 0.50–1.10)
GFR calc Af Amer: >90 mL/min (ref >90)
GFR calc non Af Amer: 89 mL/min — ABNORMAL LOW (ref >90)
Glucose, Bld: 112 mg/dL — ABNORMAL HIGH (ref 70–99)
Potassium: 3.2 mEq/L — ABNORMAL LOW (ref 3.5–5.1)
Sodium: 135 mEq/L (ref 135–145)
Total Bilirubin: 0.6 mg/dL (ref 0.3–1.2)
Total Protein: 7.7 g/dL (ref 6.0–8.3)

## 2011-04-09 MED ORDER — HYDROCODONE-ACETAMINOPHEN 5-325 MG PO TABS: 2.0000 | ORAL_TABLET | Freq: Once | ORAL | Status: AC

## 2011-04-09 MED ORDER — ONDANSETRON HCL 4 MG/2ML IJ SOLN
4.0000 mg | Freq: Once | INTRAMUSCULAR | Status: AC
  Administered 2011-04-09: 4 mg via INTRAVENOUS
  Filled 2011-04-09: qty 2

## 2011-04-09 MED ORDER — HYDROCODONE-ACETAMINOPHEN 5-325 MG PO TABS
2.0000 | ORAL_TABLET | Freq: Once | ORAL | Status: AC
  Administered 2011-04-09: 2 via ORAL
  Filled 2011-04-09 (×2): qty 1

## 2011-04-09 MED ORDER — IOHEXOL 300 MG/ML  SOLN
80.0000 mL | Freq: Once | INTRAMUSCULAR | Status: AC | PRN
  Administered 2011-04-09: 80 mL via INTRAVENOUS
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
## 2011-04-09 NOTE — ED Notes (Signed)
Patient transported to X-ray 

## 2011-04-09 NOTE — ED Notes (Signed)
Pt c/o right wrist pain and swelling after tripping on stairs and falling on Wednesday; swelling noted; CMS intact; pt sts left 5ht digit pain and abd pain; pt denies LOC

## 2011-04-09 NOTE — ED Notes (Signed)
Awaiting ct results

## 2011-04-09 NOTE — ED Notes (Signed)
Returned from ct 

## 2011-04-09 NOTE — Progress Notes (Signed)
Orthopedic Tech Progress Note Patient Details:  Emily [name] 1941/05/04 [id number]  Other Ortho Devices Ortho Device Location: arm sling (R) UE Ortho Device Interventions: Application  Type of Splint: Finger;Sugartong Splint Location: sugartong (R) UE finger splint (L)UE Splint Interventions: Application    Jennye Moccasin 04/09/2011, 6:46 PM

## 2011-04-09 NOTE — ED Provider Notes (Signed)
Medical screening examination/treatment/procedure(s) were conducted as a shared visit with non-physician practitioner(s) and myself.  I personally evaluated the patient during the encounter  Elderly patient seen by me status post fall on Tuesday which would've been 2 days ago going up some outside steps. Resulted in injury to her right wrist has an obvious deformity and shows a distal radius fracture. Also injury to her left little thing. But patient also has complaint of left-sided abdominal pain with some persistent nausea. She states that the nausea is somewhat baseline for her and more intense now because she has female take her antinausea medicines Zofran. However on exam patient has fairly remarkable left-sided abdominal tenderness particularly in the left upper quadrant. She does relate that her belly did go into the side of the steps. We will move patient over to CDU and get CT scans of abdomen to rule out splenic injury. Patient has a permanent feeding tube in place and that seems fine.     Shelda Jakes, MD 04/09/11 [phone]

## 2011-04-10 NOTE — ED Provider Notes (Signed)
Medical screening examination/treatment/procedure(s) were conducted as a shared visit with non-physician practitioner(s) and myself.  I personally evaluated the patient during the encounter  CT scan to rule out abdominal injury without sig findings.    Shelda Jakes, MD 04/10/11 [phone]

## 2011-04-10 NOTE — ED Provider Notes (Signed)
Medical screening examination/treatment/procedure(s) were conducted as a shared visit with non-physician practitioner(s) and myself.  I personally evaluated the patient during the encounter  See my previous note on this patient.  Shelda Jakes, MD 04/10/11 1136

## 2011-04-14 ENCOUNTER — Other Ambulatory Visit: Payer: Self-pay | Admitting: Orthopedic Surgery

## 2011-04-15 ENCOUNTER — Other Ambulatory Visit: Payer: Self-pay | Admitting: Orthopedic Surgery

## 2011-04-17 ENCOUNTER — Encounter (HOSPITAL_BASED_OUTPATIENT_CLINIC_OR_DEPARTMENT_OTHER): Payer: Self-pay | Admitting: *Deleted

## 2011-04-17 NOTE — Progress Notes (Signed)
Bring all meds.

## 2011-04-17 NOTE — Progress Notes (Signed)
Pt had a cpap years ago-has lost 50 lb since so much gi problems, does not have osa now

## 2011-04-20 ENCOUNTER — Encounter (HOSPITAL_BASED_OUTPATIENT_CLINIC_OR_DEPARTMENT_OTHER)
Admission: RE | Disposition: A | Discharge status: Home / Self Care | Payer: Self-pay | Source: Ambulatory Visit | Attending: Orthopedic Surgery

## 2011-04-20 ENCOUNTER — Ambulatory Visit (HOSPITAL_BASED_OUTPATIENT_CLINIC_OR_DEPARTMENT_OTHER): Payer: Medicare Other | Admitting: Anesthesiology

## 2011-04-20 ENCOUNTER — Ambulatory Visit (HOSPITAL_BASED_OUTPATIENT_CLINIC_OR_DEPARTMENT_OTHER)
Admission: RE | Admit: 2011-04-20 | Discharge: 2011-04-20 | Disposition: A | Discharge status: Home / Self Care | Payer: Medicare Other | Source: Ambulatory Visit | Attending: Orthopedic Surgery | Admitting: Orthopedic Surgery

## 2011-04-20 ENCOUNTER — Encounter (HOSPITAL_BASED_OUTPATIENT_CLINIC_OR_DEPARTMENT_OTHER): Payer: Self-pay

## 2011-04-20 ENCOUNTER — Encounter (HOSPITAL_BASED_OUTPATIENT_CLINIC_OR_DEPARTMENT_OTHER): Payer: Self-pay | Admitting: Anesthesiology

## 2011-04-20 DIAGNOSIS — G4730 Sleep apnea, unspecified: Secondary | ICD-10-CM | POA: Insufficient documentation

## 2011-04-20 DIAGNOSIS — I10 Essential (primary) hypertension: Secondary | ICD-10-CM | POA: Insufficient documentation

## 2011-04-20 DIAGNOSIS — E119 Type 2 diabetes mellitus without complications: Secondary | ICD-10-CM | POA: Insufficient documentation

## 2011-04-20 DIAGNOSIS — W19XXXA Unspecified fall, initial encounter: Secondary | ICD-10-CM | POA: Insufficient documentation

## 2011-04-20 DIAGNOSIS — K219 Gastro-esophageal reflux disease without esophagitis: Secondary | ICD-10-CM | POA: Insufficient documentation

## 2011-04-20 DIAGNOSIS — IMO0002 Reserved for concepts with insufficient information to code with codable children: Secondary | ICD-10-CM | POA: Insufficient documentation

## 2011-04-20 HISTORY — PX: ORIF FINGER FRACTURE: SHX2122

## 2011-04-20 LAB — POCT I-STAT, CHEM 8
BUN: 10 mg/dL (ref 6–23)
Calcium, Ion: 1.11 mmol/L — ABNORMAL LOW (ref 1.12–1.32)
Chloride: 99 mEq/L (ref 96–112)
Creatinine, Ser: 0.8 mg/dL (ref 0.50–1.10)
Glucose, Bld: 137 mg/dL — ABNORMAL HIGH (ref 70–99)
HCT: 41 % (ref 36.0–46.0)
Hemoglobin: 13.9 g/dL (ref 12.0–15.0)
Potassium: 3.2 mEq/L — ABNORMAL LOW (ref 3.5–5.1)
Sodium: 136 mEq/L (ref 135–145)
TCO2: 28 mmol/L (ref 0–100)

## 2011-04-20 SURGERY — OPEN REDUCTION INTERNAL FIXATION (ORIF) METACARPAL (FINGER) FRACTURE
Anesthesia: General | Site: Finger | Laterality: Left | Wound class: Clean

## 2011-04-20 MED ORDER — LACTATED RINGERS IV SOLN
INTRAVENOUS | Status: DC
  Administered 2011-04-20 (×2): via INTRAVENOUS

## 2011-04-20 MED ORDER — ONDANSETRON HCL 4 MG/2ML IJ SOLN
INTRAMUSCULAR | Status: DC | PRN
  Administered 2011-04-20: 4 mg via INTRAVENOUS

## 2011-04-20 MED ORDER — BUPIVACAINE HCL (PF) 0.25 % IJ SOLN
INTRAMUSCULAR | Status: DC | PRN
  Administered 2011-04-20: 10 mL

## 2011-04-20 MED ORDER — CHLORHEXIDINE GLUCONATE 4 % EX LIQD: 60.0000 mL | Freq: Once | CUTANEOUS | Status: DC

## 2011-04-20 MED ORDER — HYDROMORPHONE HCL PF 1 MG/ML IJ SOLN: 0.2500 mg | INTRAMUSCULAR | Status: DC | PRN

## 2011-04-20 MED ORDER — CEFAZOLIN SODIUM 1-5 GM-% IV SOLN
1.0000 g | INTRAVENOUS | Status: AC
  Administered 2011-04-20: 1 g via INTRAVENOUS

## 2011-04-20 MED ORDER — SUCCINYLCHOLINE CHLORIDE 20 MG/ML IJ SOLN
INTRAMUSCULAR | Status: DC | PRN
  Administered 2011-04-20: 60 mg via INTRAVENOUS

## 2011-04-20 MED ORDER — OXYCODONE-ACETAMINOPHEN 5-325 MG PO TABS: ORAL_TABLET | ORAL | Status: AC

## 2011-04-20 MED ORDER — OXYCODONE-ACETAMINOPHEN 5-325 MG PO TABS
1.0000 | ORAL_TABLET | Freq: Once | ORAL | Status: AC
  Administered 2011-04-20: 1 via ORAL

## 2011-04-20 MED ORDER — FENTANYL CITRATE 0.05 MG/ML IJ SOLN
INTRAMUSCULAR | Status: DC | PRN
  Administered 2011-04-20: 25 ug via INTRAVENOUS
  Administered 2011-04-20: 100 ug via INTRAVENOUS
  Administered 2011-04-20 (×2): 25 ug via INTRAVENOUS

## 2011-04-20 MED ORDER — PROPOFOL 10 MG/ML IV EMUL
INTRAVENOUS | Status: DC | PRN
  Administered 2011-04-20: 40 mg via INTRAVENOUS
  Administered 2011-04-20: 160 mg via INTRAVENOUS
  Administered 2011-04-20: 20 mg via INTRAVENOUS
  Administered 2011-04-20: 30 mg via INTRAVENOUS

## 2011-04-20 MED ORDER — MEPERIDINE HCL 25 MG/ML IJ SOLN: 6.2500 mg | INTRAMUSCULAR | Status: DC | PRN

## 2011-04-20 MED ORDER — ONDANSETRON HCL 4 MG/2ML IJ SOLN
4.0000 mg | Freq: Once | INTRAMUSCULAR | Status: AC | PRN
  Administered 2011-04-20: 4 mg via INTRAVENOUS

## 2011-04-20 SURGICAL SUPPLY — 78 items
BANDAGE ELASTIC 3 VELCRO ST LF (GAUZE/BANDAGES/DRESSINGS) ×1 IMPLANT
BANDAGE GAUZE ELAST BULKY 4 IN (GAUZE/BANDAGES/DRESSINGS) ×2 IMPLANT
BIT DRILL 1.1 (BIT) ×4
BIT DRILL 60X20X1.1XQC TMX (BIT) IMPLANT
BIT DRL 60X20X1.1XQC TMX (BIT) ×2
BLADE MINI RND TIP GREEN BEAV (BLADE) IMPLANT
BLADE SURG 15 STRL LF DISP TIS (BLADE) ×2 IMPLANT
BLADE SURG 15 STRL SS (BLADE) ×4
BLADE SURG ROTATE 9660 (MISCELLANEOUS) ×1 IMPLANT
BNDG CMPR 9X4 STRL LF SNTH (GAUZE/BANDAGES/DRESSINGS) ×1
BNDG CMPR MD 5X2 ELC HKLP STRL (GAUZE/BANDAGES/DRESSINGS)
BNDG ELASTIC 2 VLCR STRL LF (GAUZE/BANDAGES/DRESSINGS) IMPLANT
BNDG ESMARK 4X9 LF (GAUZE/BANDAGES/DRESSINGS) ×1 IMPLANT
BNDG PLASTER X FAST 3X3 WHT LF (CAST SUPPLIES) ×20 IMPLANT
BNDG PLSTR 9X3 FST ST WHT (CAST SUPPLIES) ×20
BONE CHIP PRESERV 5CC PCAN5 (Bone Implant) ×2 IMPLANT
CHLORAPREP W/TINT 26ML (MISCELLANEOUS) ×2 IMPLANT
CLOTH BEACON ORANGE TIMEOUT ST (SAFETY) ×2 IMPLANT
CORDS BIPOLAR (ELECTRODE) ×2 IMPLANT
COVER MAYO STAND STRL (DRAPES) ×2 IMPLANT
COVER TABLE BACK 60X90 (DRAPES) ×2 IMPLANT
CUFF TOURNIQUET SINGLE 18IN (TOURNIQUET CUFF) ×2 IMPLANT
DRAPE EXTREMITY T 121X128X90 (DRAPE) ×2 IMPLANT
DRAPE OEC MINIVIEW 54X84 (DRAPES) ×1 IMPLANT
DRAPE SURG 17X23 STRL (DRAPES) ×2 IMPLANT
GAUZE XEROFORM 1X8 LF (GAUZE/BANDAGES/DRESSINGS) ×2 IMPLANT
GLOVE BIO SURGEON STRL SZ 6.5 (GLOVE) ×1 IMPLANT
GLOVE BIO SURGEON STRL SZ7.5 (GLOVE) ×2 IMPLANT
GLOVE BIOGEL PI IND STRL 7.0 (GLOVE) IMPLANT
GLOVE BIOGEL PI IND STRL 8 (GLOVE) ×1 IMPLANT
GLOVE BIOGEL PI IND STRL 8.5 (GLOVE) IMPLANT
GLOVE BIOGEL PI INDICATOR 7.0 (GLOVE) ×1
GLOVE BIOGEL PI INDICATOR 8 (GLOVE) ×1
GLOVE BIOGEL PI INDICATOR 8.5 (GLOVE)
GLOVE SURG ORTHO 8.0 STRL STRW (GLOVE) IMPLANT
GOWN BRE IMP SLV AUR XL STRL (GOWN DISPOSABLE) ×1 IMPLANT
GOWN PREVENTION PLUS XLARGE (GOWN DISPOSABLE) ×1 IMPLANT
GOWN PREVENTION PLUS XXLARGE (GOWN DISPOSABLE) ×1 IMPLANT
GOWN STRL REIN XL XLG (GOWN DISPOSABLE) ×2 IMPLANT
GRAFT BNE CANC CHIPS 1-8 5CC (Bone Implant) IMPLANT
K-WIRE DBL TRONS .035X6 (WIRE) ×6
KWIRE DBL TRONS .035X6 (WIRE) IMPLANT
LOCK SCREW 1.5X9MM (Screw) ×2 IMPLANT
NDL HYPO 25X1 1.5 SAFETY (NEEDLE) IMPLANT
NEEDLE HYPO 22GX1.5 SAFETY (NEEDLE) IMPLANT
NEEDLE HYPO 25X1 1.5 SAFETY (NEEDLE) ×2 IMPLANT
NS IRRIG 1000ML POUR BTL (IV SOLUTION) ×2 IMPLANT
PACK BASIN DAY SURGERY FS (CUSTOM PROCEDURE TRAY) ×2 IMPLANT
PAD CAST 3X4 CTTN HI CHSV (CAST SUPPLIES) IMPLANT
PAD CAST 4YDX4 CTTN HI CHSV (CAST SUPPLIES) IMPLANT
PADDING CAST ABS 4INX4YD NS (CAST SUPPLIES)
PADDING CAST ABS COTTON 4X4 ST (CAST SUPPLIES) ×1 IMPLANT
PADDING CAST COTTON 3X4 STRL (CAST SUPPLIES) ×2
PADDING CAST COTTON 4X4 STRL (CAST SUPPLIES)
PLATE T SMALL 1.5MM (Plate) ×1 IMPLANT
SCREW L 1.5X12 (Screw) ×2 IMPLANT
SCREW LOCK 1.5X9MM (Screw) IMPLANT
SCREW LOCKING 1.5X10 (Screw) ×1 IMPLANT
SCREW LOCKING 1.5X11MM (Screw) ×1 IMPLANT
SCREW LOCKING 1.5X8 (Screw) ×2 IMPLANT
SLEEVE SCD COMPRESS KNEE MED (MISCELLANEOUS) ×1 IMPLANT
SPLINT PLASTER CAST XFAST 4X15 (CAST SUPPLIES) IMPLANT
SPLINT PLASTER XTRA FAST SET 4 (CAST SUPPLIES)
SPONGE GAUZE 4X4 12PLY (GAUZE/BANDAGES/DRESSINGS) ×2 IMPLANT
STOCKINETTE 4X48 STRL (DRAPES) ×2 IMPLANT
SUT ETHILON 3 0 PS 1 (SUTURE) IMPLANT
SUT ETHILON 4 0 PS 2 18 (SUTURE) ×2 IMPLANT
SUT MERSILENE 4 0 P 3 (SUTURE) ×1 IMPLANT
SUT VIC AB 3-0 PS1 18 (SUTURE)
SUT VIC AB 3-0 PS1 18XBRD (SUTURE) IMPLANT
SUT VIC AB 4-0 P-3 18XBRD (SUTURE) IMPLANT
SUT VIC AB 4-0 P3 18 (SUTURE) ×2
SUT VICRYL 4-0 PS2 18IN ABS (SUTURE) IMPLANT
SYR BULB 3OZ (MISCELLANEOUS) ×2 IMPLANT
SYR CONTROL 10ML LL (SYRINGE) ×1 IMPLANT
TOWEL OR 17X24 6PK STRL BLUE (TOWEL DISPOSABLE) ×3 IMPLANT
UNDERPAD 30X30 INCONTINENT (UNDERPADS AND DIAPERS) ×2 IMPLANT
WATER STERILE IRR 1000ML POUR (IV SOLUTION) ×1 IMPLANT

## 2011-04-20 NOTE — Anesthesia Procedure Notes (Addendum)
Procedure Name: Intubation Date/Time: 04/20/2011 8:47 AM Performed by: Jearld Shines Pre-anesthesia Checklist: Patient identified, Timeout performed, Emergency Drugs available, Suction available and Patient being monitored Patient Re-evaluated:Patient Re-evaluated prior to inductionOxygen Delivery Method: Circle System Utilized Preoxygenation: Pre-oxygenation with 100% oxygen Intubation Type: IV induction, Circoid Pressure applied and Rapid sequence Laryngoscope Size: Miller and 2 Grade View: Grade II Tube type: Oral Tube size: 7.0 mm Number of attempts: 1 Placement Confirmation: ETT inserted through vocal cords under direct vision,  positive ETCO2 and breath sounds checked- equal and bilateral Secured at: 20 cm Tube secured with: Tape Dental Injury: Teeth and Oropharynx as per pre-operative assessment     Skin prepped L foot ,#20 IV inserted. Free running fluids. IV secured.  Lonia Skinner MD

## 2011-04-20 NOTE — Brief Op Note (Signed)
04/20/2011  11:16 AM  PATIENT:  Emily [name]  70 y.o. female  PRE-OPERATIVE DIAGNOSIS:  left small proximal phalanx fracture  POST-OPERATIVE DIAGNOSIS:  left small proximal phalanx fracture  PROCEDURE:  Procedure(s): OPEN REDUCTION INTERNAL FIXATION (ORIF) METACARPAL (FINGER) FRACTURE  SURGEON:  Surgeon(s): Tami Ribas  PHYSICIAN ASSISTANT:   ASSISTANTS: none   ANESTHESIA:   general  EBL:  Total I/O In: 1000 [I.V.:1000] Out: -   BLOOD ADMINISTERED:none  DRAINS: none   LOCAL MEDICATIONS USED:  MARCAINE 10 CC  SPECIMEN:  No Specimen  DISPOSITION OF SPECIMEN:  N/A  COUNTS:  YES  TOURNIQUET:   Total Tourniquet Time Documented: Forearm (Left) - 124 minutes  DICTATION: .Other Dictation: Dictation Number [id number]  PLAN OF CARE: Discharge to home after PACU  PATIENT DISPOSITION:  PACU - hemodynamically stable.

## 2011-04-20 NOTE — OR Nursing (Signed)
Attempted to contact patient's family concerning need for bone graft.  Pt's brother-in-law had left building.  Called his cell phone and left message to return call.

## 2011-04-20 NOTE — Transfer of Care (Signed)
Immediate Anesthesia Transfer of Care Note  Patient: Emily [name]  Procedure(s) Performed:  OPEN REDUCTION INTERNAL FIXATION (ORIF) METACARPAL (FINGER) FRACTURE - open reduction internal fixation left small proximal phalanx  Patient Location: PACU  Anesthesia Type: General  Level of Consciousness: sedated  Airway & Oxygen Therapy: Patient Spontanous Breathing and Patient connected to face mask oxygen  Post-op Assessment: Report given to PACU RN and Post -op Vital signs reviewed and stable  Post vital signs: Reviewed and stable  Complications: No apparent anesthesia complications

## 2011-04-20 NOTE — OR Nursing (Signed)
Contacted patient's daughter regarding need for bone graft.  Pt's daughter, Emily [name] gave telephone consent to use bone graft material.  Daughter stated patient would have no objections and is not a Jehovah's Witness.

## 2011-04-20 NOTE — Progress Notes (Signed)
Notified Dr. Michelle Piper of pt's Heart rate 97-107 in PACU and 120 in discharge. Dr. Michelle Piper in pt's room and asked if she felt OK -- she said, "yes!" Brother- in - law stated that she has a high heart rate and pt agreed. Dr. Michelle Piper said OK for discharge.

## 2011-04-20 NOTE — Anesthesia Preprocedure Evaluation (Addendum)
Anesthesia Evaluation    Airway Mallampati: II TM Distance: >3 FB Neck ROM: Full    Dental   Pulmonary sleep apnea ,          Cardiovascular hypertension,     Neuro/Psych    GI/Hepatic GERD-  Medicated and Controlled,  Endo/Other  Diabetes mellitus-, Type 2, Oral Hypoglycemic Agents  Renal/GU      Musculoskeletal   Abdominal   Peds  Hematology   Anesthesia Other Findings   Reproductive/Obstetrics                        Anesthesia Physical Anesthesia Plan  ASA: II  Anesthesia Plan: General   Post-op Pain Management:    Induction: Intravenous, Rapid sequence and Cricoid pressure planned  Airway Management Planned: Oral ETT  Additional Equipment:   Intra-op Plan:   Post-operative Plan: Extubation in OR  Informed Consent: I have reviewed the patients History and Physical, chart, labs and discussed the procedure including the risks, benefits and alternatives for the proposed anesthesia with the patient or authorized representative who has indicated his/her understanding and acceptance.     Plan Discussed with: CRNA and Surgeon  Anesthesia Plan Comments:         Anesthesia Quick Evaluation

## 2011-04-20 NOTE — OR Nursing (Signed)
Pt admitted to OR with green "spit bag".  Bag placed in red bag and taken to pacu with patient.

## 2011-04-20 NOTE — Progress Notes (Signed)
Dr Michelle Piper by pt's bedside, aware of Sinus Tach, Nausea, and pt. Repeatedily spitting into mucous basin.  St's that was how she was on arrival.  Pt medicated for nausea, please see MAR.  NAD otherwise.
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
## 2011-04-20 NOTE — Op Note (Signed)
Dictation [id number]

## 2011-04-20 NOTE — Anesthesia Postprocedure Evaluation (Signed)
  Anesthesia Post-op Note  Patient: Emily [name]  Procedure(s) Performed:  OPEN REDUCTION INTERNAL FIXATION (ORIF) METACARPAL (FINGER) FRACTURE - open reduction internal fixation left small proximal phalanx  Patient Location: PACU  Anesthesia Type: General  Level of Consciousness: awake  Airway and Oxygen Therapy: Patient Spontanous Breathing  Post-op Pain: none  Post-op Assessment: Post-op Vital signs reviewed  Post-op Vital Signs: stable  Complications: No apparent anesthesia complications
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
## 2011-04-22 ENCOUNTER — Other Ambulatory Visit: Payer: Self-pay | Admitting: Internal Medicine

## 2011-04-22 ENCOUNTER — Encounter (HOSPITAL_BASED_OUTPATIENT_CLINIC_OR_DEPARTMENT_OTHER): Payer: Self-pay | Admitting: Orthopedic Surgery

## 2011-04-27 ENCOUNTER — Other Ambulatory Visit: Payer: Self-pay | Admitting: Internal Medicine
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
## 2011-05-12 ENCOUNTER — Other Ambulatory Visit: Payer: Self-pay | Admitting: Internal Medicine

## 2011-05-12 IMAGING — CR DG CHEST 2V
2 series · 2 of 2 positions shown · non-contrast
Comparison: Chest x-ray of 09/25/2009

CLINICAL DATA: Fever, nausea, vomiting, diarrhea

CHEST - 2 VIEW

[w chest pa]
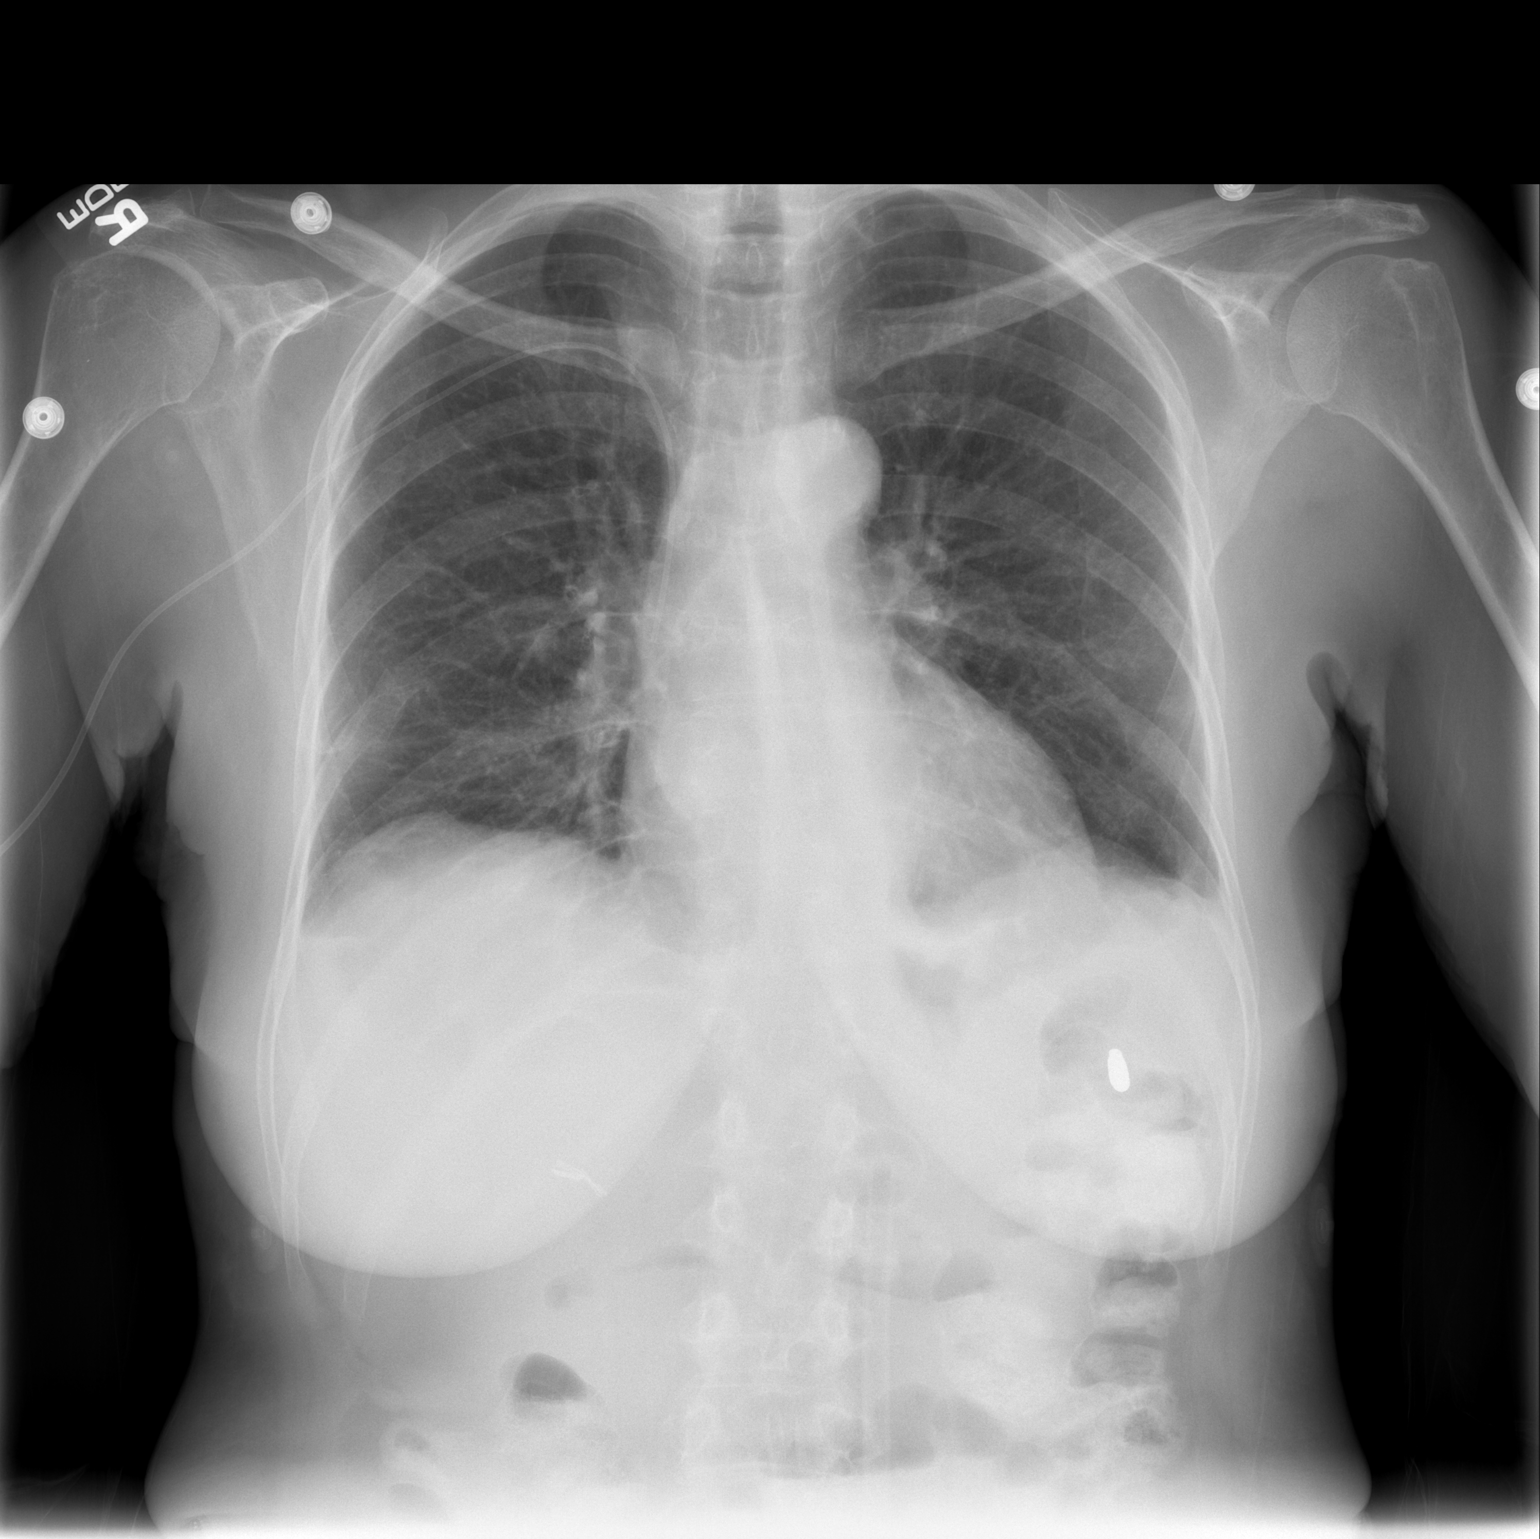

[w chest lat]
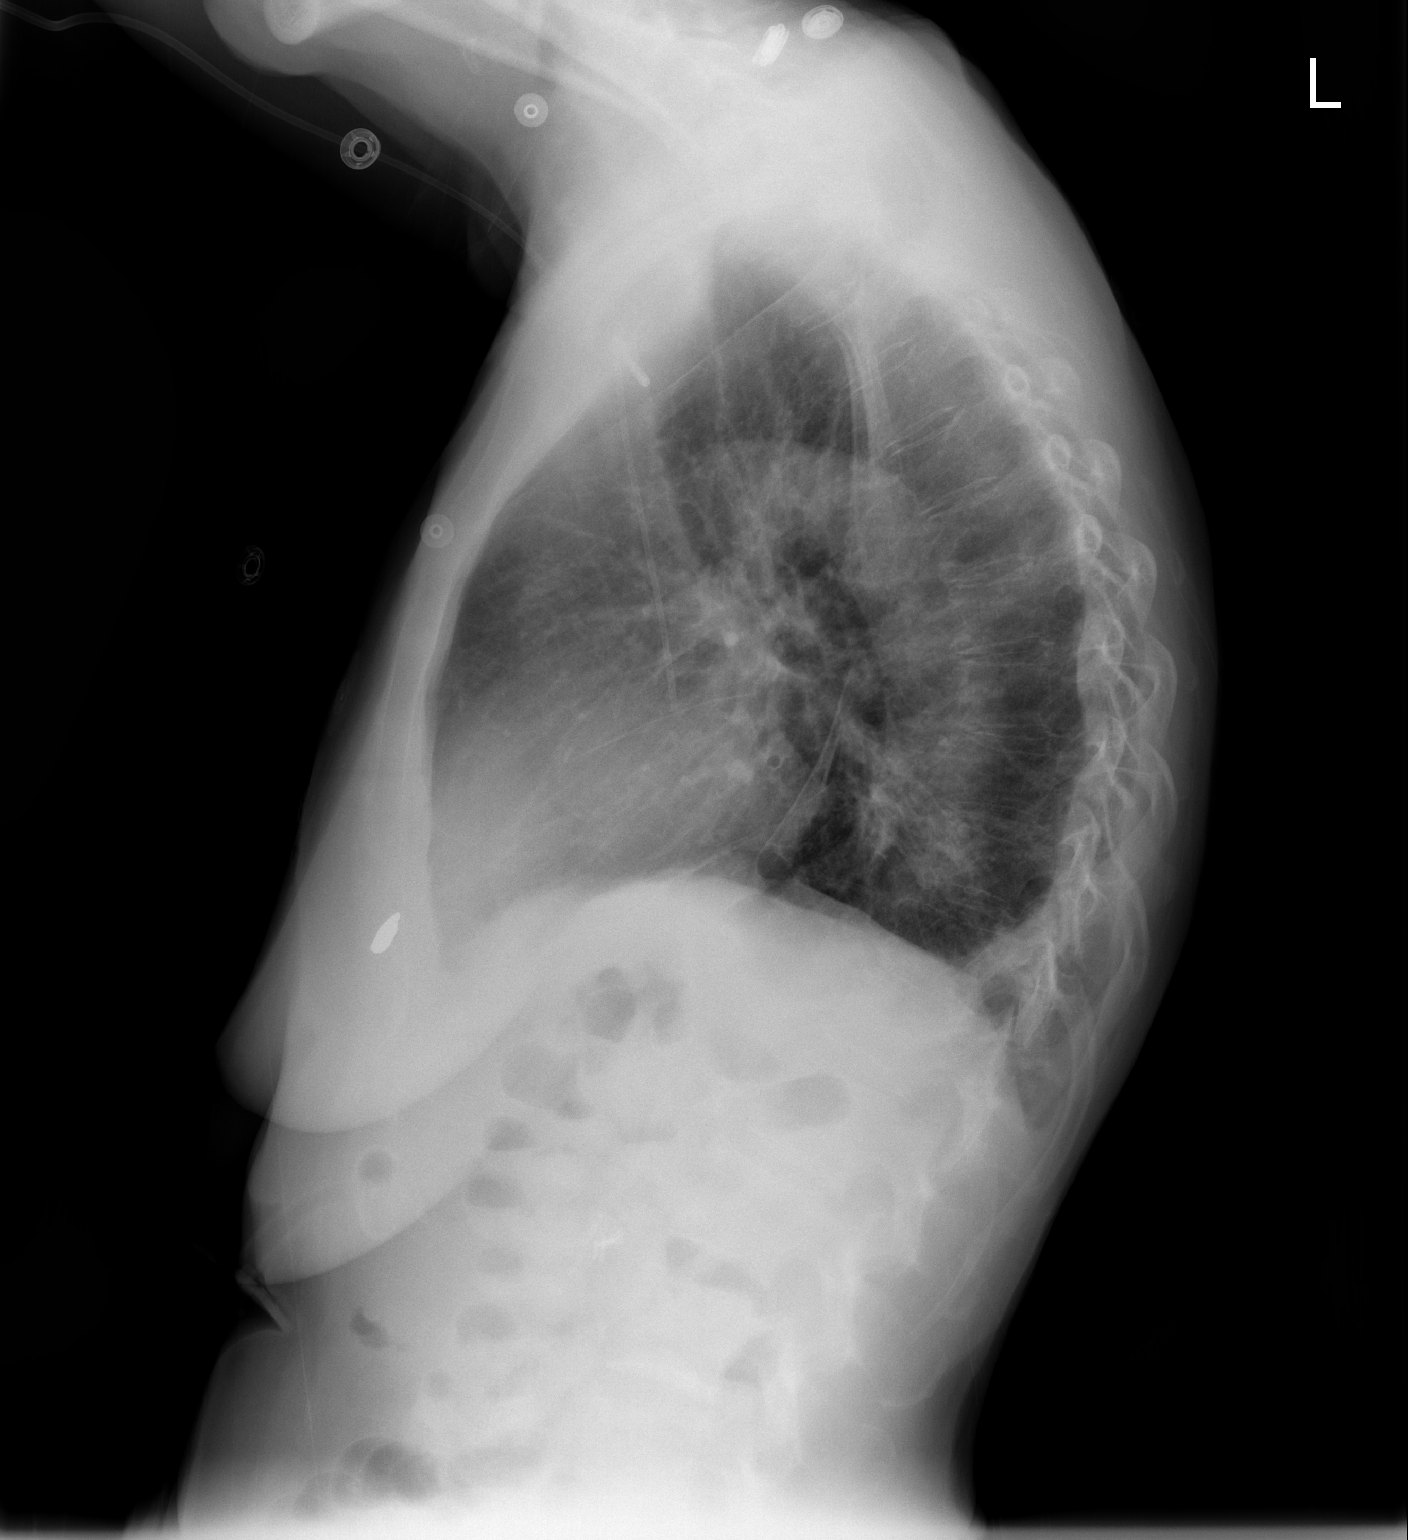

[2 of 2 positions shown; findings below may reference images not displayed]

FINDINGS: Slightly prominent markings are noted at both lung bases
most consistent with linear atelectasis.  No definite infiltrate or
effusion is seen.  The heart is mildly enlarged and stable.  Right
PICC line is noted with the tip in the mid SVC.  No bony
abnormality is seen.
IMPRESSION: Bibasilar linear atelectasis.  No definite infiltrate or effusion.

## 2011-05-12 MED ORDER — SUCRALFATE 1 GM/10ML PO SUSP: 1.0000 g | Freq: Four times a day (QID) | ORAL | Status: DC | PRN

## 2011-05-12 NOTE — Telephone Encounter (Signed)
rx sent

## 2011-05-15 IMAGING — CR DG ABDOMEN 2V
2 series · 2 of 2 positions shown · non-contrast
Comparison: CT abdomen and pelvis 12/19/2009 and two views the
abdomen 12/18/2009.

CLINICAL DATA: Nausea, vomiting and abdominal pain.

ABDOMEN - 2 VIEW

[w abdomen upright]
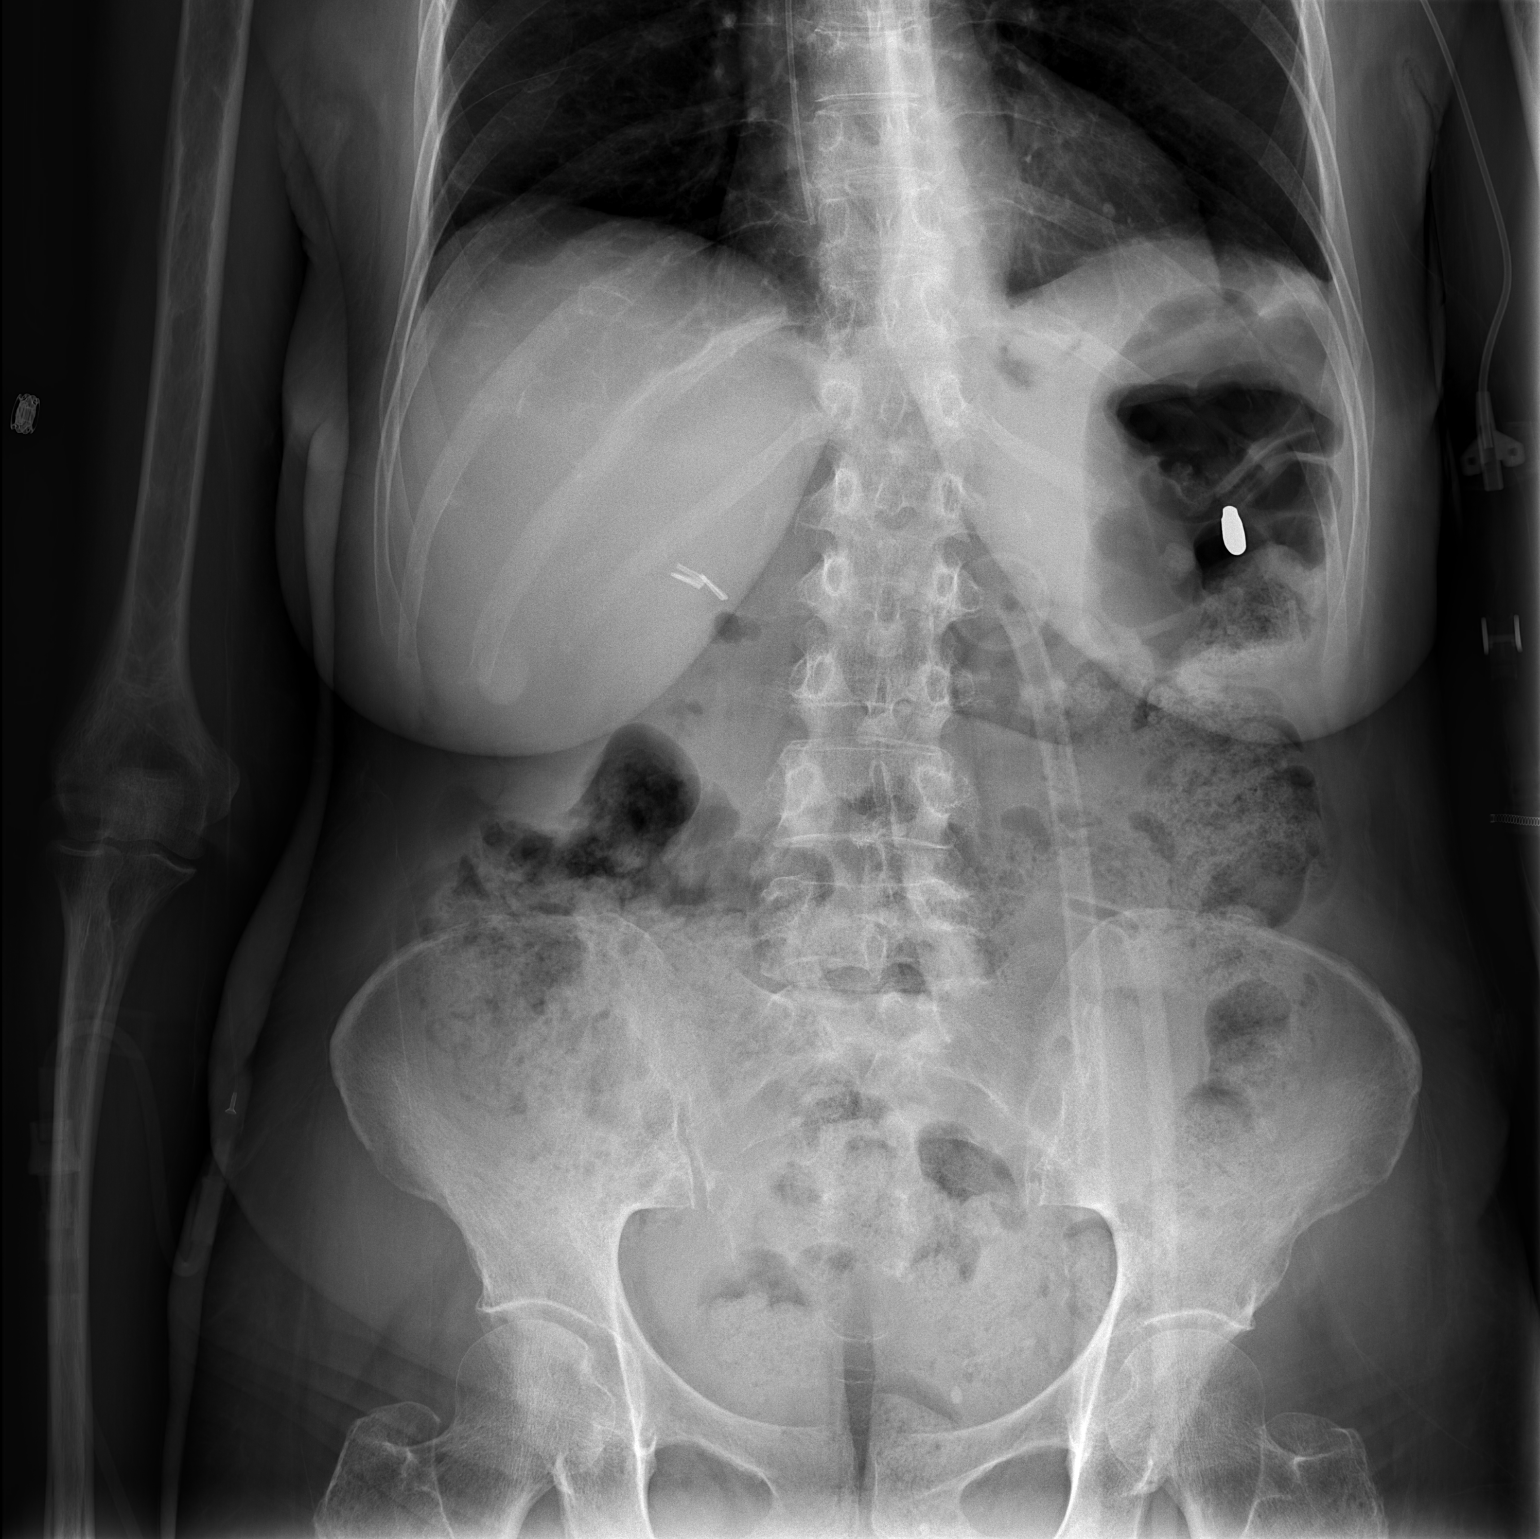

[t abdomen supine]
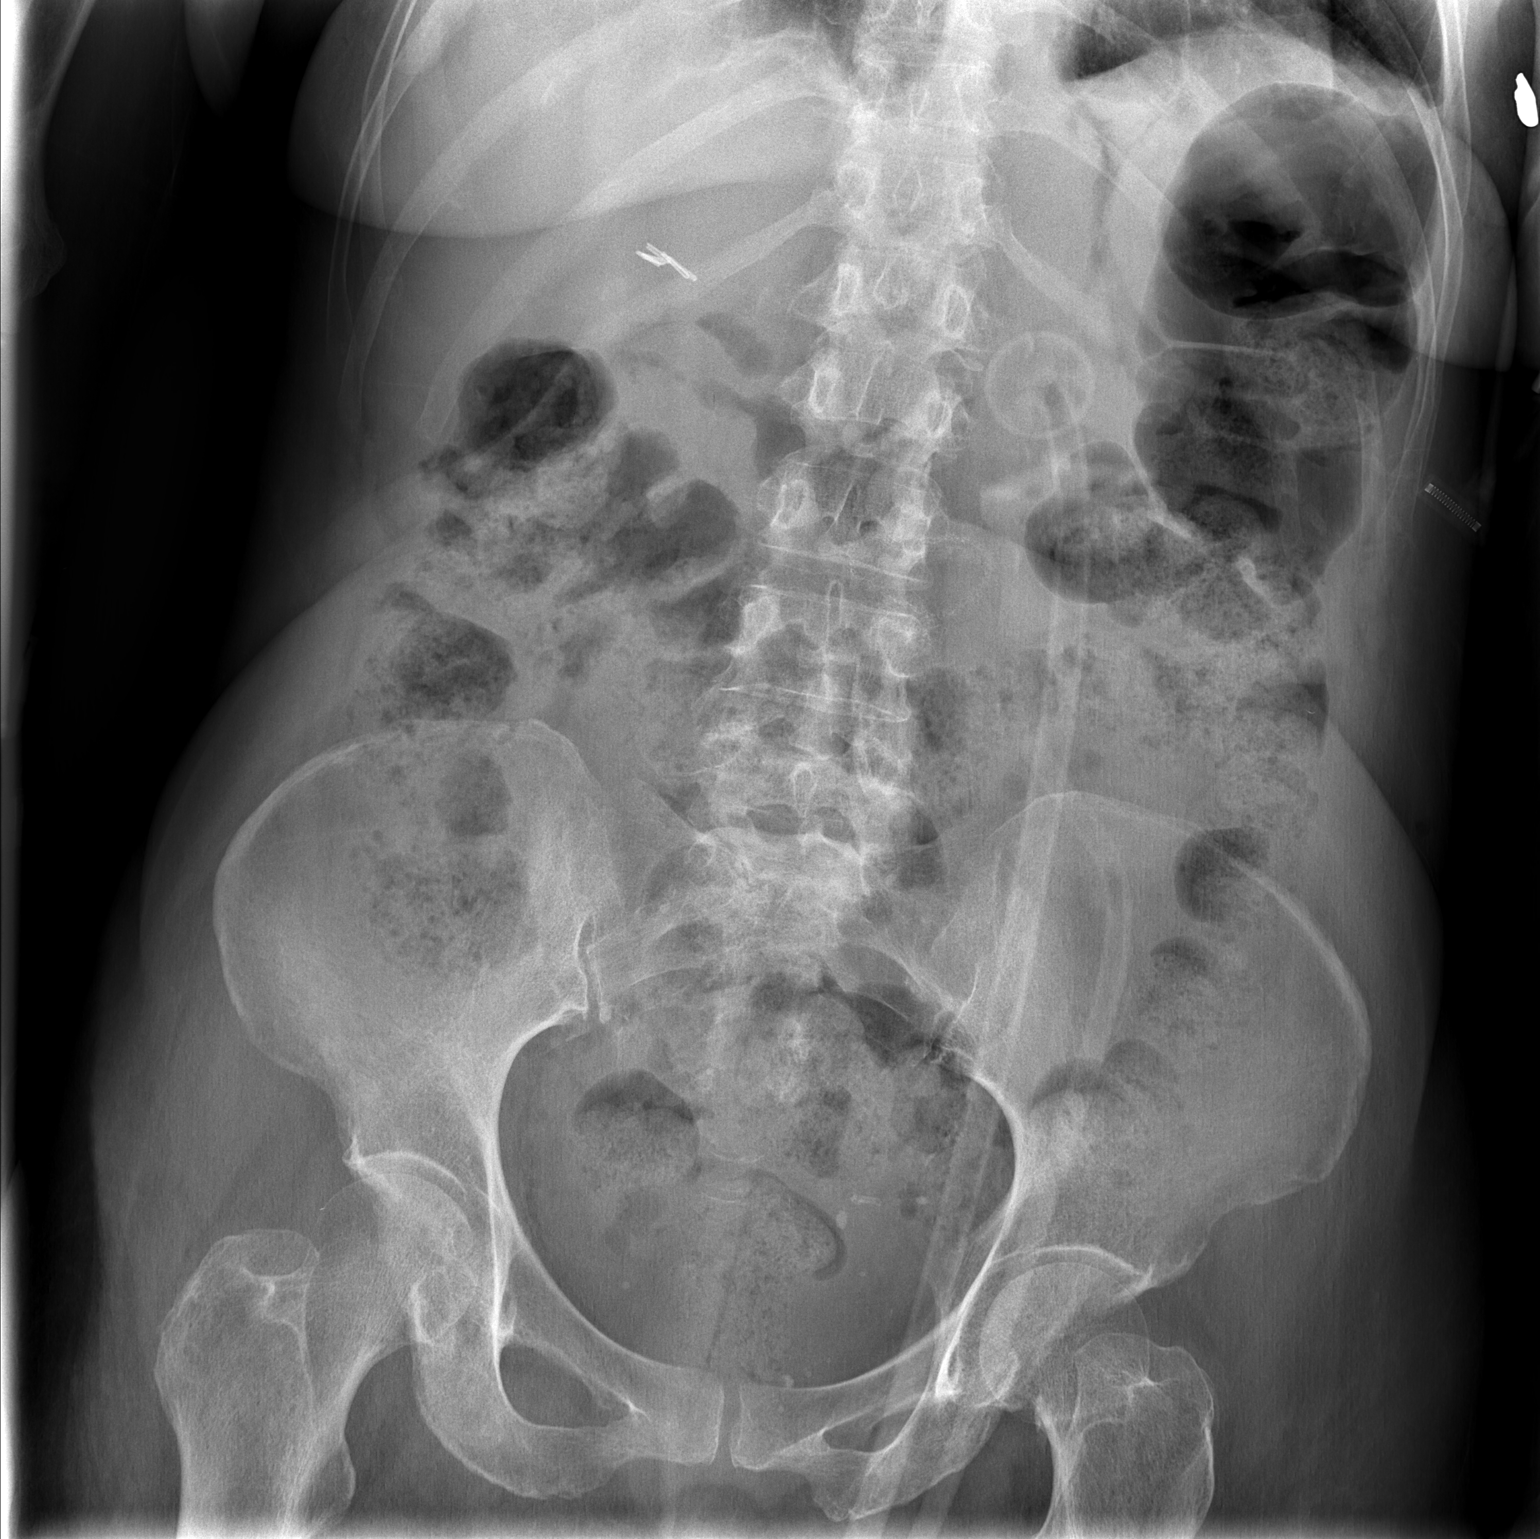

[2 of 2 positions shown; findings below may reference images not displayed]

FINDINGS: No free intraperitoneal air is identified.  There is no
evidence of small bowel obstruction.  The patient has a moderately
large volume stool present throughout the colon.  PEG tube and
clips from cholecystectomy noted.
IMPRESSION: 1.  No acute finding.
2.  Moderately large stool burden.

## 2011-05-15 IMAGING — CR DG CHEST 1V PORT
1 series · 1 of 1 positions shown · non-contrast
Comparison: 12/20/2009

CLINICAL DATA: PICC line placement.

PORTABLE CHEST - 1 VIEW

[view not recorded]
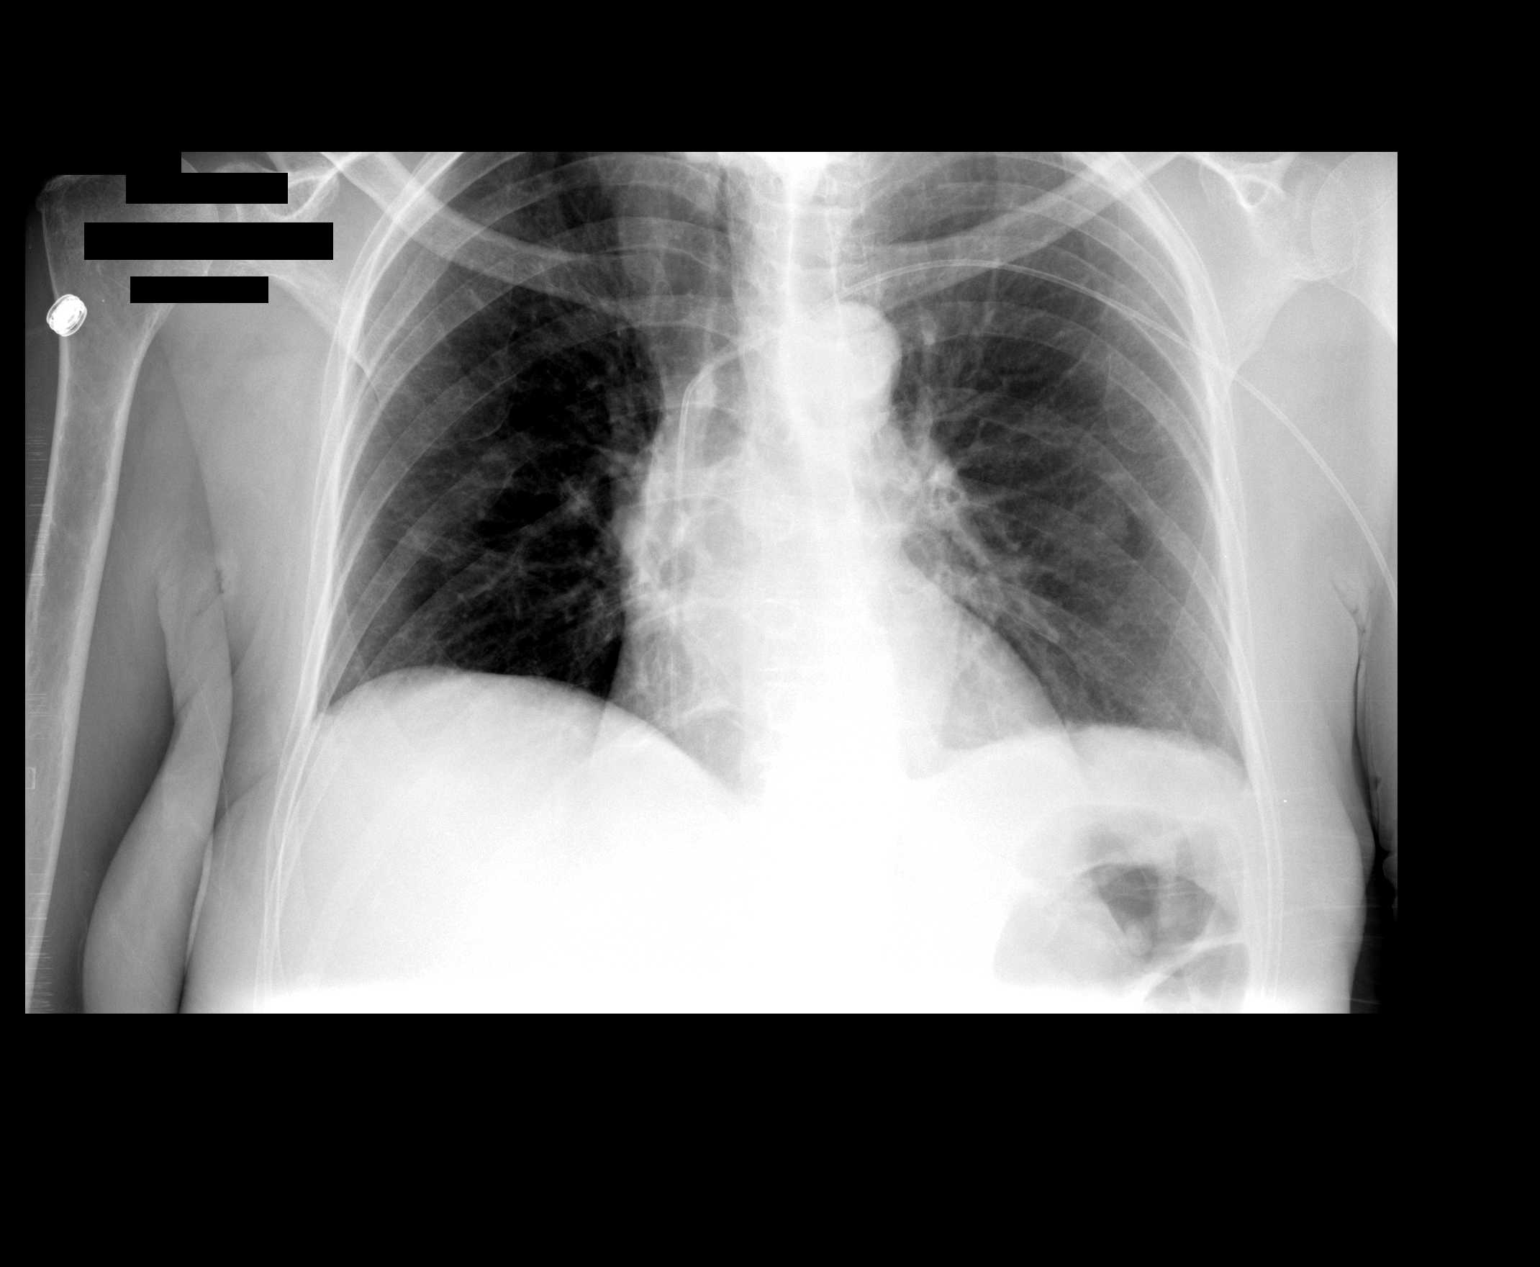

[1 of 1 positions shown; findings below may reference images not displayed]

FINDINGS: Right PICC line has been removed.  Interval placement of
left PICC line.  The tip is in the upper right atrium 2 cm deep to
the cavoatrial junction.

Heart is normal size.  There are low lung volumes.  Lungs are
clear.  No effusions or acute bony abnormality.
IMPRESSION: Left PICC line tip in the upper right atrium 2 cm deep to the
cavoatrial junction.

## 2011-05-19 ENCOUNTER — Telehealth: Payer: Self-pay | Admitting: Internal Medicine

## 2011-05-19 NOTE — Telephone Encounter (Signed)
Left a message for Selena Batten to call me.

## 2011-05-19 NOTE — Telephone Encounter (Signed)
Spoke with Montine Circle. Per procedure note on 04/02/11, decrease jejunal tube feeding to 2-3 cans/day to avoid crampy abd pain and diarrhea. Faxed order to [phone].

## 2011-05-20 ENCOUNTER — Telehealth: Payer: Self-pay | Admitting: Internal Medicine

## 2011-05-20 NOTE — Telephone Encounter (Signed)
Left a message for Michelle to call me. 

## 2011-05-21 NOTE — Telephone Encounter (Signed)
Spoke with Marcelino Duster and clarified order.

## 2011-05-25 ENCOUNTER — Other Ambulatory Visit: Payer: Self-pay | Admitting: Internal Medicine

## 2011-05-29 ENCOUNTER — Telehealth: Payer: Self-pay | Admitting: Internal Medicine

## 2011-05-29 MED ORDER — HYDROCORTISONE ACETATE 25 MG RE SUPP: RECTAL | Status: DC

## 2011-05-29 NOTE — Telephone Encounter (Signed)
Patient calling to report she is seeing bright, red blood on toilet tissue when she wipes for the last 2 days. Denies seeing blood on stool or in toilet water. States she has diarrhea stool 2 /day. She is having crampy abdominal pain at her navel area but this is not new. Denies recent constipation.Last colon 12/08-diverticulosis, internal hemorrhoids. Please, advise.

## 2011-05-29 NOTE — Telephone Encounter (Signed)
Spoke with patient and gave her Dr. Regino Schultze recommendations. She will try the suppositories. She understands to call me if bleeding continues and schedule OV.

## 2011-05-29 NOTE — Telephone Encounter (Signed)
Please call her in Anusol HC supp, #12, insert one bid. 1 refill. She will be due for colon 04/2012 or earlier if bleeding continues. I will be happy to see her in the office at some point to assess the bleeding. Her parent had colon cancer

## 2011-06-05 ENCOUNTER — Telehealth: Payer: Self-pay | Admitting: Internal Medicine

## 2011-06-05 NOTE — Telephone Encounter (Signed)
I have spoken with [place] @ patient's insurance company and phenergan has been approved. She will send approval letter in 1 hour.

## 2011-06-15 ENCOUNTER — Telehealth: Payer: Self-pay | Admitting: *Deleted

## 2011-06-15 MED ORDER — METOCLOPRAMIDE HCL 5 MG/5ML PO SOLN: ORAL | Status: DC

## 2011-06-15 NOTE — Telephone Encounter (Signed)
I have stopped her Reglan but let's restart it at 5mg  tid ac.#90, also add antacids  gaviscon or Maalox) 1 table sp after meals

## 2011-06-15 NOTE — Telephone Encounter (Signed)
Patient calling to report she is having sharp pain in her stomach that comes and goes for the last 2 weeks. States the medication is not helping. She is"sick at her stomach. If she drinks water, it comes back up. She wants to schedule an OV but she cannot come tomorrow. Scheduled patient on 06/16/12 at 1:30 PM with Willette Cluster, NP

## 2011-06-15 NOTE — Telephone Encounter (Signed)
Spoke with patient and gave her Dr. Regino Schultze recommendation. She states she takes the Reglan solution. Rx sent for patient.

## 2011-06-17 ENCOUNTER — Ambulatory Visit: Payer: Medicare Other | Admitting: Nurse Practitioner

## 2011-06-18 ENCOUNTER — Telehealth: Payer: Self-pay | Admitting: Internal Medicine

## 2011-06-18 NOTE — Telephone Encounter (Signed)
Spoke with patient and scheduled her for 06/19/11 at 1:15/1:30 PM with Mike Gip, PA

## 2011-06-19 ENCOUNTER — Encounter: Payer: Self-pay | Admitting: Physician Assistant

## 2011-06-19 ENCOUNTER — Ambulatory Visit (INDEPENDENT_AMBULATORY_CARE_PROVIDER_SITE_OTHER)
Admission: RE | Admit: 2011-06-19 | Discharge: 2011-06-19 | Disposition: A | Discharge status: Home / Self Care | Payer: Medicare Other | Source: Ambulatory Visit | Attending: Physician Assistant | Admitting: Physician Assistant

## 2011-06-19 ENCOUNTER — Ambulatory Visit (INDEPENDENT_AMBULATORY_CARE_PROVIDER_SITE_OTHER): Payer: Medicare Other | Admitting: Physician Assistant

## 2011-06-19 ENCOUNTER — Other Ambulatory Visit (INDEPENDENT_AMBULATORY_CARE_PROVIDER_SITE_OTHER): Payer: Medicare Other

## 2011-06-19 DIAGNOSIS — R112 Nausea with vomiting, unspecified: Secondary | ICD-10-CM

## 2011-06-19 DIAGNOSIS — R109 Unspecified abdominal pain: Secondary | ICD-10-CM

## 2011-06-19 LAB — COMPREHENSIVE METABOLIC PANEL
ALT: 32 U/L (ref 0–35)
AST: 50 U/L — ABNORMAL HIGH (ref 0–37)
Albumin: 3.7 g/dL (ref 3.5–5.2)
Alkaline Phosphatase: 122 U/L — ABNORMAL HIGH (ref 39–117)
BUN: 17 mg/dL (ref 6–23)
CO2: 27 mEq/L (ref 19–32)
Calcium: 9.4 mg/dL (ref 8.4–10.5)
Chloride: 100 mEq/L (ref 96–112)
Creatinine, Ser: 0.7 mg/dL (ref 0.4–1.2)
GFR: 102.69 mL/min (ref >60.00)
Glucose, Bld: 180 mg/dL — ABNORMAL HIGH (ref 70–99)
Potassium: 3 mEq/L — ABNORMAL LOW (ref 3.5–5.1)
Sodium: 140 mEq/L (ref 135–145)
Total Bilirubin: 0.9 mg/dL (ref 0.3–1.2)
Total Protein: 7.3 g/dL (ref 6.0–8.3)

## 2011-06-19 LAB — CBC WITH DIFFERENTIAL/PLATELET
Basophils Absolute: 0.1 10*3/uL (ref 0.0–0.1)
Basophils Relative: 0.5 % (ref 0.0–3.0)
Eosinophils Absolute: 0 10*3/uL (ref 0.0–0.7)
Eosinophils Relative: 0 % (ref 0.0–5.0)
HCT: 40.4 % (ref 36.0–46.0)
Hemoglobin: 13.1 g/dL (ref 12.0–15.0)
Lymphocytes Relative: 4.8 % — ABNORMAL LOW (ref 12.0–46.0)
Lymphs Abs: 0.5 10*3/uL — ABNORMAL LOW (ref 0.7–4.0)
MCHC: 32.5 g/dL (ref 30.0–36.0)
MCV: 80.3 fl (ref 78.0–100.0)
Monocytes Absolute: 0.5 10*3/uL (ref 0.1–1.0)
Monocytes Relative: 5.1 % (ref 3.0–12.0)
Neutro Abs: 9.5 10*3/uL — ABNORMAL HIGH (ref 1.4–7.7)
Neutrophils Relative %: 89.6 % — ABNORMAL HIGH (ref 43.0–77.0)
Platelets: 328 10*3/uL (ref 150.0–400.0)
RBC: 5.03 Mil/uL (ref 3.87–5.11)
RDW: 13.6 % (ref 11.5–14.6)
WBC: 10.6 10*3/uL — ABNORMAL HIGH (ref 4.5–10.5)

## 2011-06-19 LAB — LIPASE: Lipase: 60 U/L — ABNORMAL HIGH (ref 11.0–59.0)

## 2011-06-19 MED ORDER — HYDROCODONE-ACETAMINOPHEN 10-300 MG/15ML PO SOLN: 15.0000 mL | Freq: Four times a day (QID) | ORAL | Status: DC

## 2011-06-19 MED ORDER — ONDANSETRON 4 MG PO TBDP: 4.0000 mg | ORAL_TABLET | Freq: Three times a day (TID) | ORAL | Status: DC | PRN

## 2011-06-19 MED ORDER — PROMETHAZINE HCL 25 MG/ML IJ SOLN: 25.0000 mg | Freq: Once | INTRAMUSCULAR | Status: DC
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
## 2011-06-19 NOTE — Progress Notes (Signed)
I agree with attempt to keep her out of the hospital for this chronic problem as long as she can maintain her electrolyte balance

## 2011-06-19 NOTE — Patient Instructions (Signed)
Please go to the basement level to have your labs drawn.   Also go to the Radiology department in our basement level for an x-ray. We sent prescriptions for zofran ( dissolve on the tongue) and Metozolob ( dissolve on the tongue).  Tube feeding with 4 cans today and take 6 oz of water after each can. We have given you an injection of phenergan for nausea.

## 2011-06-23 ENCOUNTER — Telehealth: Payer: Self-pay | Admitting: Internal Medicine

## 2011-06-23 ENCOUNTER — Other Ambulatory Visit: Payer: Self-pay | Admitting: *Deleted

## 2011-06-23 DIAGNOSIS — E876 Hypokalemia: Secondary | ICD-10-CM

## 2011-06-23 MED ORDER — POTASSIUM CHLORIDE 20 MEQ/15ML (10%) PO LIQD: ORAL | Status: DC

## 2011-06-23 NOTE — Telephone Encounter (Signed)
See results note on 06/23/11

## 2011-06-23 NOTE — Telephone Encounter (Signed)
Patient calling requesting lab/xray  results from last week. Please, advise

## 2011-06-26 ENCOUNTER — Telehealth: Payer: Self-pay | Admitting: Internal Medicine

## 2011-06-26 ENCOUNTER — Other Ambulatory Visit: Payer: Self-pay | Admitting: Internal Medicine

## 2011-06-26 NOTE — Telephone Encounter (Signed)
Patient calling to report she started having liquid diarrhea 3-4 days ago. Reports this happens when she increases her feeding to 4 cans/day(which was done at her visit with Mike Gip, PA ) States she did have blood when she wiped x 1 one Tues or Wednesday but has not had anymore bleeding. States she has had 3-4 liquid diarrhea stools today. She took Imodium about 5 minutes ago for the first time this week.

## 2011-06-27 NOTE — Telephone Encounter (Signed)
Please cut  down on her Osmolyte to 3 cans/day ( Amy increased it from 2/day to 4/day).Also if vomiting continues ,will need EGD

## 2011-06-29 NOTE — Telephone Encounter (Signed)
Spoke with patient and she will decrease to 3 cans/day as recommended. She will call if this does not help.

## 2011-07-02 IMAGING — RF DG FLUORO RM 1-60 MIN
1 series · 1 of 1 positions shown · non-contrast
Comparison: none

CLINICAL DATA: Chronic jejunal feeds, jejunostomy removed
accidentally

[Series 1: run · 1 of 1 slices shown]
[im 1/1]
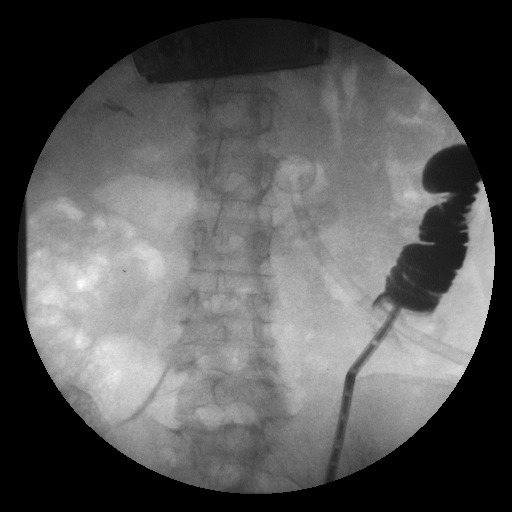

[1 of 1 positions shown; findings below may reference images not displayed]

FLUOROSCOPIC JEJUNOSTOMY FEEDING TUBE REPLACEMENT

Date:  02/09/2010 [DATE]

Radiologist:  Gargi Tiger, M.D.

Guidance:  Fluoroscopic

Fluoroscopy time:  1.3 minutes

Contrast volume:  20 ml

Complications:  No immediate

PROCEDURE/FINDINGS:

Informed consent was obtained from the patient following
explanation of the procedure, risks, benefits and alternatives.
The patient understands, agrees and consents for the procedure.
All questions were addressed.  A time out was performed.

In the left lower quadrant, the existing percutaneous tract was
cannulated with a Kumpe catheter and an Amplatz guide wire.  Tract
dilatation was performed.  A 16-French balloon retention
jejunostomy was advanced over the guide wire within the jejunum.
Retention balloon inflated with 10 ml saline.  This was retracted
against the anterior abdominal wall.  Contrast injection confirms
position within small bowel.  Images obtained for documentation.
Jejunostomy ready for use.  Sterile dressing applied.  No immediate
complication.
IMPRESSION: Fluoroscopic replacement of the 16-French jejunostomy ready for
use.

## 2011-07-06 ENCOUNTER — Telehealth: Payer: Self-pay | Admitting: Internal Medicine

## 2011-07-06 NOTE — Telephone Encounter (Signed)
Spoke with patient and she has a cold. Suggested saline drops for nose and to avoid any medications with Advil in it. She states she is having chills then sweats. She does not know if she is running fever. Suggested she go to her PCP or urgent care if she is running fever from her cold.

## 2011-07-08 ENCOUNTER — Other Ambulatory Visit: Payer: Self-pay | Admitting: Internal Medicine

## 2011-07-15 ENCOUNTER — Ambulatory Visit (HOSPITAL_COMMUNITY): Admission: RE | Admit: 2011-07-15 | Payer: Medicare Other | Source: Ambulatory Visit

## 2011-07-15 ENCOUNTER — Other Ambulatory Visit (HOSPITAL_COMMUNITY): Payer: Medicare Other

## 2011-07-15 ENCOUNTER — Telehealth: Payer: Self-pay | Admitting: Internal Medicine

## 2011-07-15 DIAGNOSIS — K3184 Gastroparesis: Secondary | ICD-10-CM

## 2011-07-15 NOTE — Telephone Encounter (Signed)
Spoke with patient and feeding tube fell out last night. Per Dr. Juanda Chance, have IR replace since they did the last one. Spoke with IR and patient to go to [facility] radiology now for replacement. Order in [facility]

## 2011-07-16 ENCOUNTER — Other Ambulatory Visit: Payer: Self-pay | Admitting: Internal Medicine

## 2011-07-16 ENCOUNTER — Ambulatory Visit (HOSPITAL_COMMUNITY)
Admission: RE | Admit: 2011-07-16 | Discharge: 2011-07-16 | Disposition: A | Discharge status: Home / Self Care | Payer: Medicare Other | Source: Ambulatory Visit | Attending: Internal Medicine | Admitting: Internal Medicine

## 2011-07-16 ENCOUNTER — Ambulatory Visit (HOSPITAL_COMMUNITY): Payer: Medicare Other

## 2011-07-16 DIAGNOSIS — K3184 Gastroparesis: Secondary | ICD-10-CM

## 2011-07-16 DIAGNOSIS — Z431 Encounter for attention to gastrostomy: Secondary | ICD-10-CM | POA: Insufficient documentation

## 2011-07-16 MED ORDER — LIDOCAINE VISCOUS 2 % MT SOLN
5.0000 mL | OROMUCOSAL | Status: AC
  Administered 2011-07-16: 5 mL via OROMUCOSAL
  Filled 2011-07-16: qty 5

## 2011-07-16 NOTE — Procedures (Signed)
J-tube fell out.  Replaced J-tube with fluoroscopy.

## 2011-07-17 ENCOUNTER — Other Ambulatory Visit: Payer: Self-pay | Admitting: Internal Medicine

## 2011-07-20 ENCOUNTER — Other Ambulatory Visit: Payer: Self-pay | Admitting: Internal Medicine

## 2011-07-22 IMAGING — CR DG ABDOMEN ACUTE W/ 1V CHEST
4 series · 4 of 4 positions shown · non-contrast
Comparison: 12/23/2009

CLINICAL DATA: Abdominal pain.  Diabetic gastroparesis.  Former
smoker.

ACUTE ABDOMEN SERIES (ABDOMEN 2 VIEW & CHEST 1 VIEW)

[w chest pa]
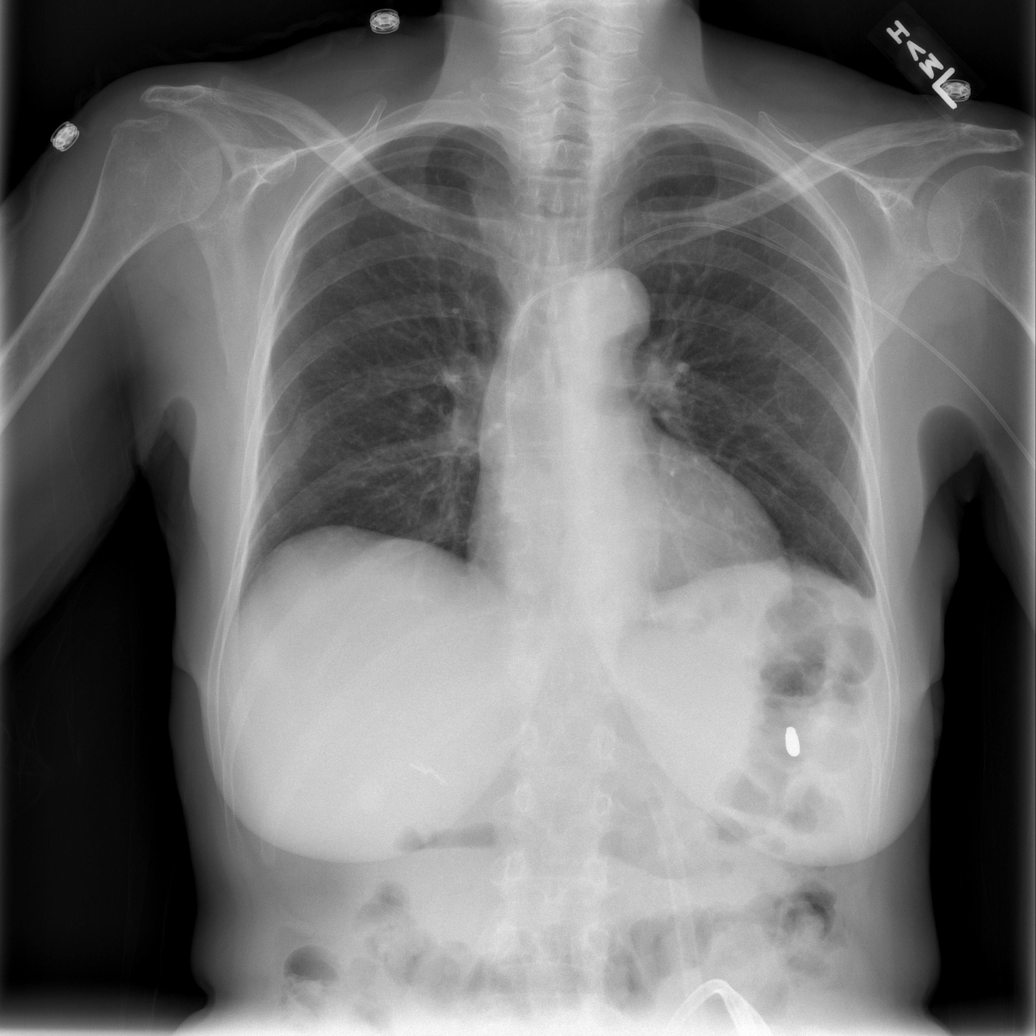

[w abdomen upright *]
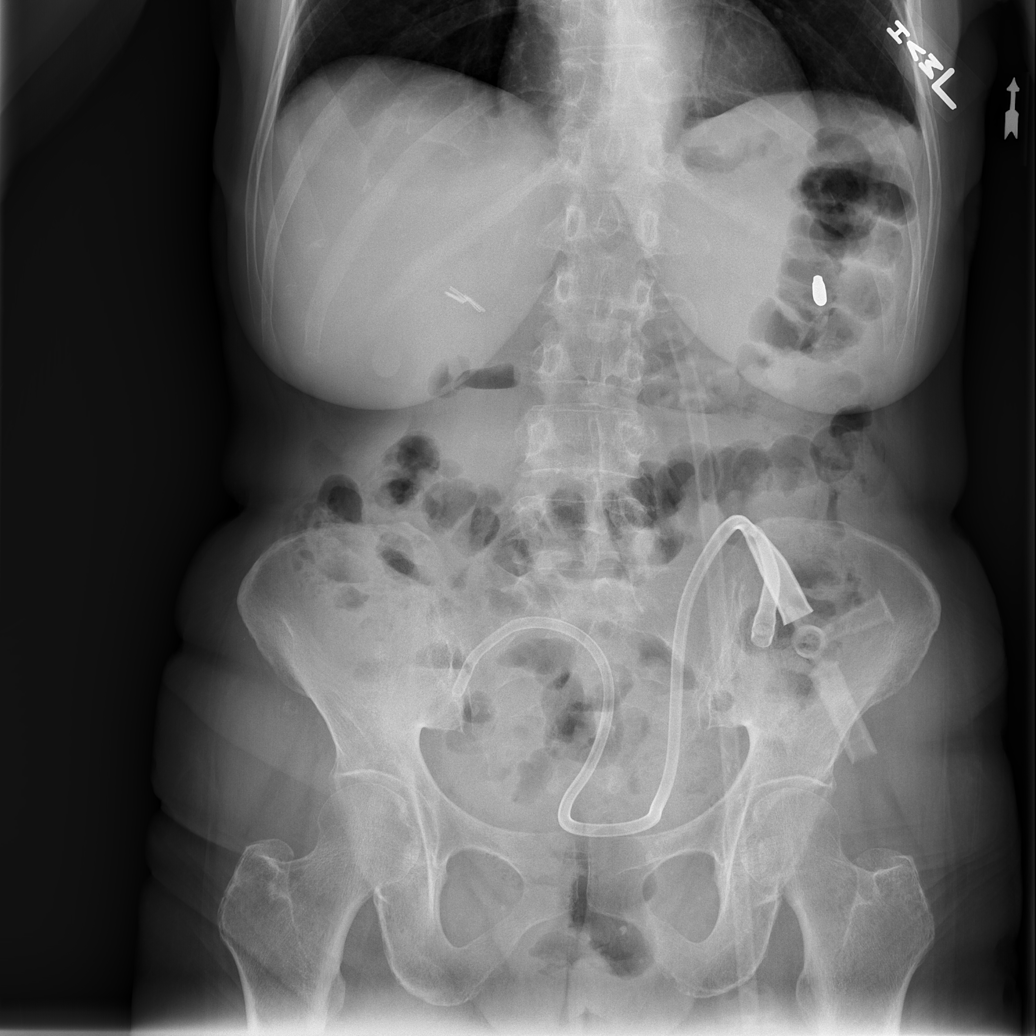

[t abdomen supine (1 of 2)]
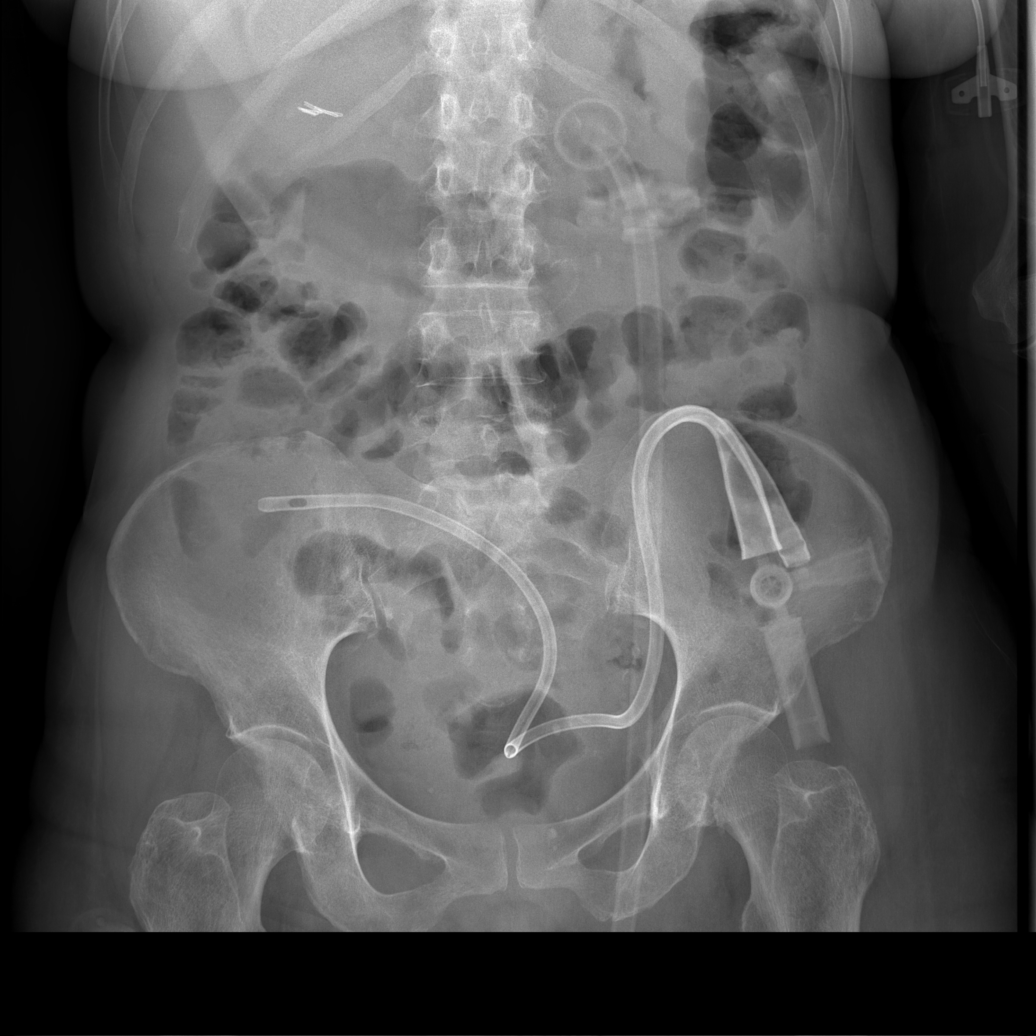

[t abdomen supine (2 of 2)]
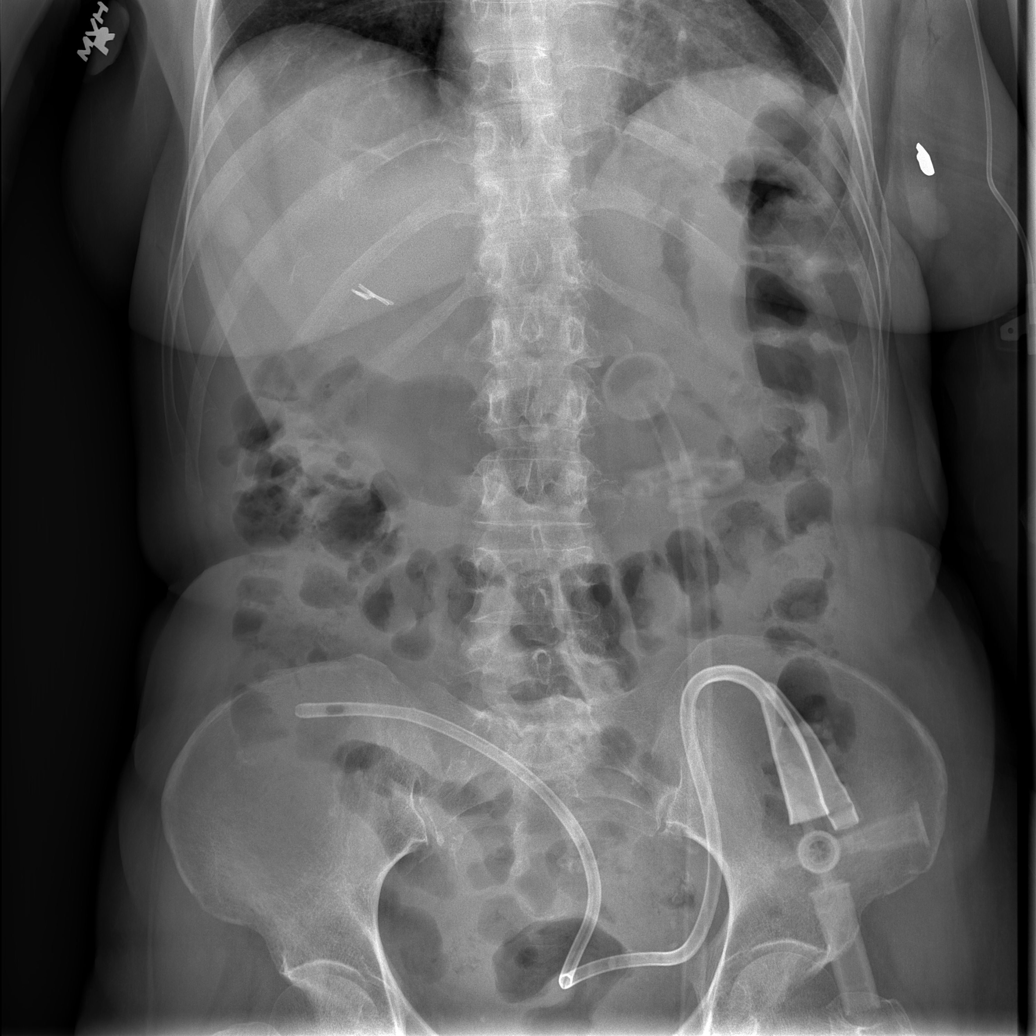

[4 of 4 positions shown; findings below may reference images not displayed]

FINDINGS: A percutaneous gastrostomy tube is again seen, and a
jejunostomy tube is seen in the lower abdomen and pelvis.
Scattered small bowel air fluid levels are seen however there is no
evidence of dilated bowel loops.  No evidence of free air.
Surgical clips again noted from prior cholecystectomy.

Heart size is normal.  Both lungs are clear.  Left arm PICC line
remains in appropriate position with the tip in expected region of
the cavoatrial junction.  Old right rib fracture deformities again
noted.
IMPRESSION: 1.  Nonspecific, nonobstructive bowel gas pattern.
2.  No active cardiopulmonary disease.

## 2011-08-21 IMAGING — CR DG UGI W/ KUB
1 series · 1 of 1 positions shown · non-contrast
Comparison: 03/01/2010

CLINICAL DATA: PAIN, NAUSEA WITH USE OF JEJUNOSTOMY CATHETER.

UPPER GI SERIES W/ KUB

[view not recorded]
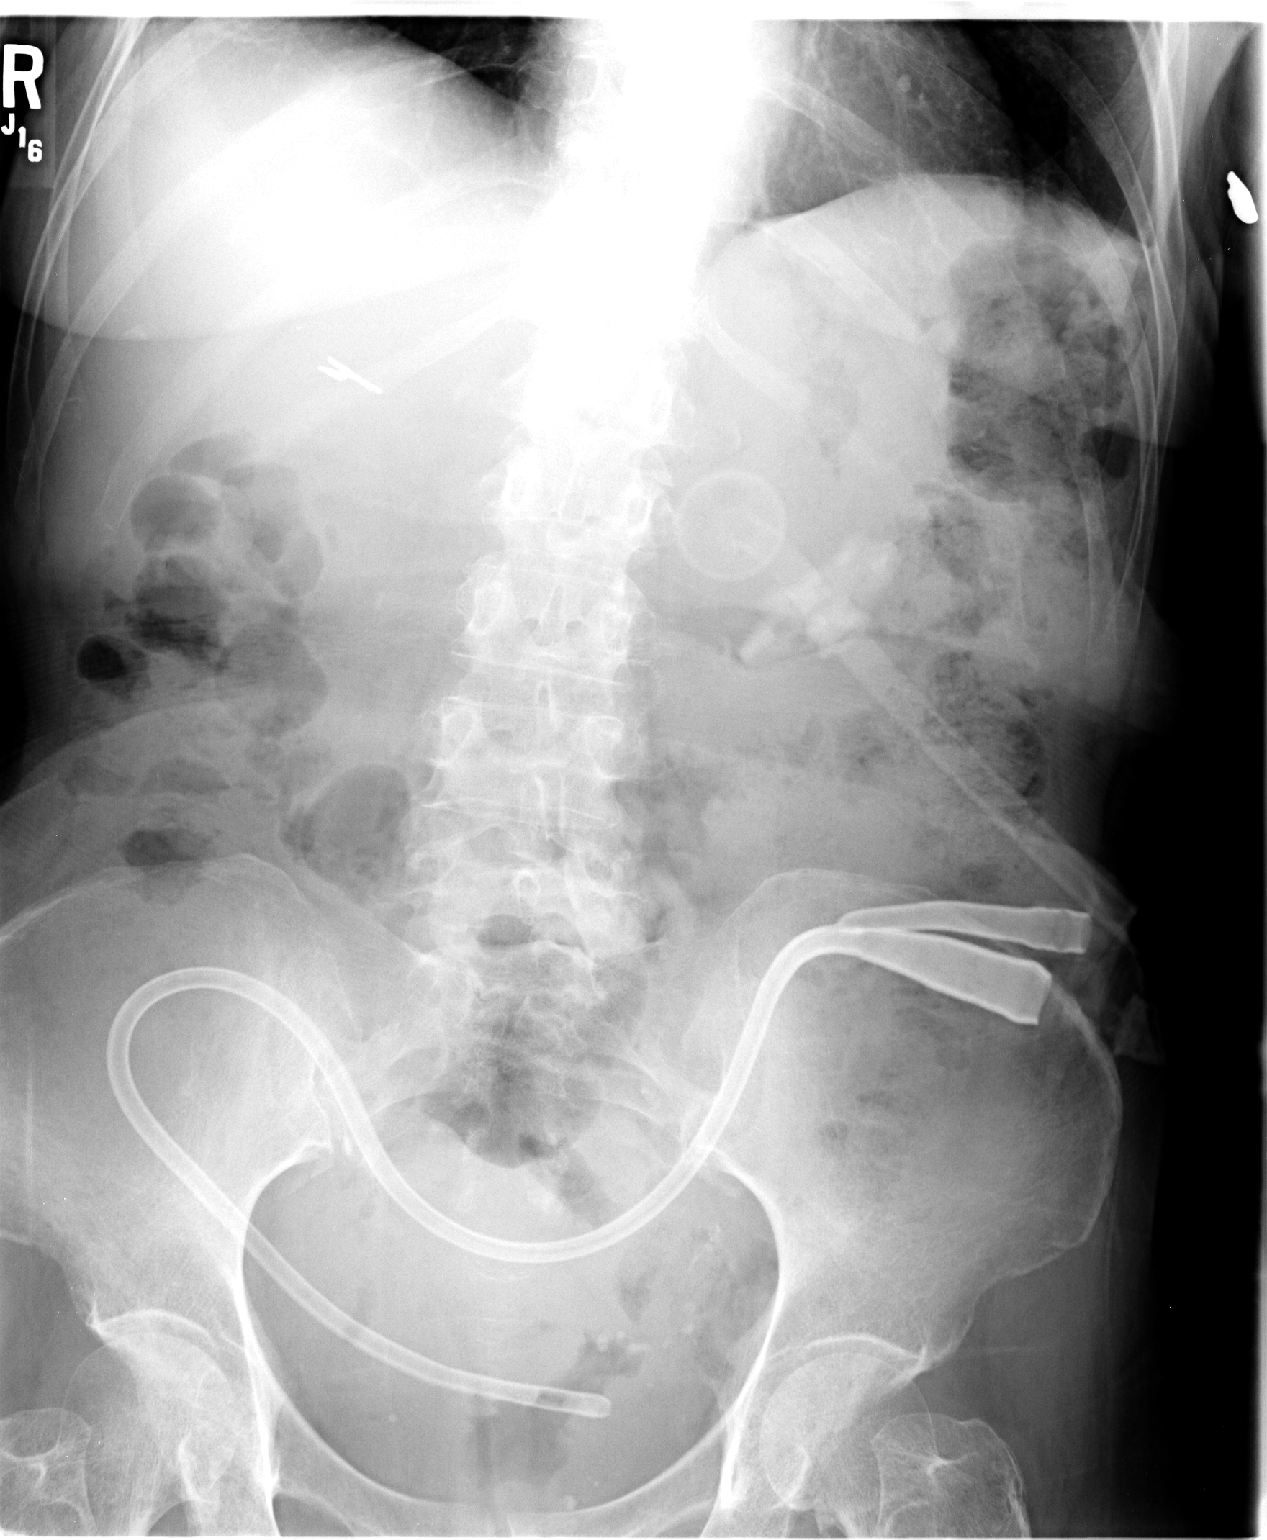

[1 of 1 positions shown; findings below may reference images not displayed]

FINDINGS: Jejunostomy catheter is in place.  The tip appears
further downstream than on prior KUB.  Contrast injection opacifies
the lower pelvic small bowel loops.  These loops empty normally.
No stricture visualized.
IMPRESSION: Satisfactory placement of the jejunostomy catheter in the lower
pelvic small bowel loops.  No evidence of obstruction or stricture.

## 2011-08-24 IMAGING — CR DG ABDOMEN ACUTE W/ 1V CHEST
3 series · 3 of 3 positions shown · non-contrast
Comparison: 03/01/2010

CLINICAL DATA: Vomiting, left-sided abdominal pain.

ACUTE ABDOMEN SERIES (ABDOMEN 2 VIEW & CHEST 1 VIEW)

[w abdomen decub *]
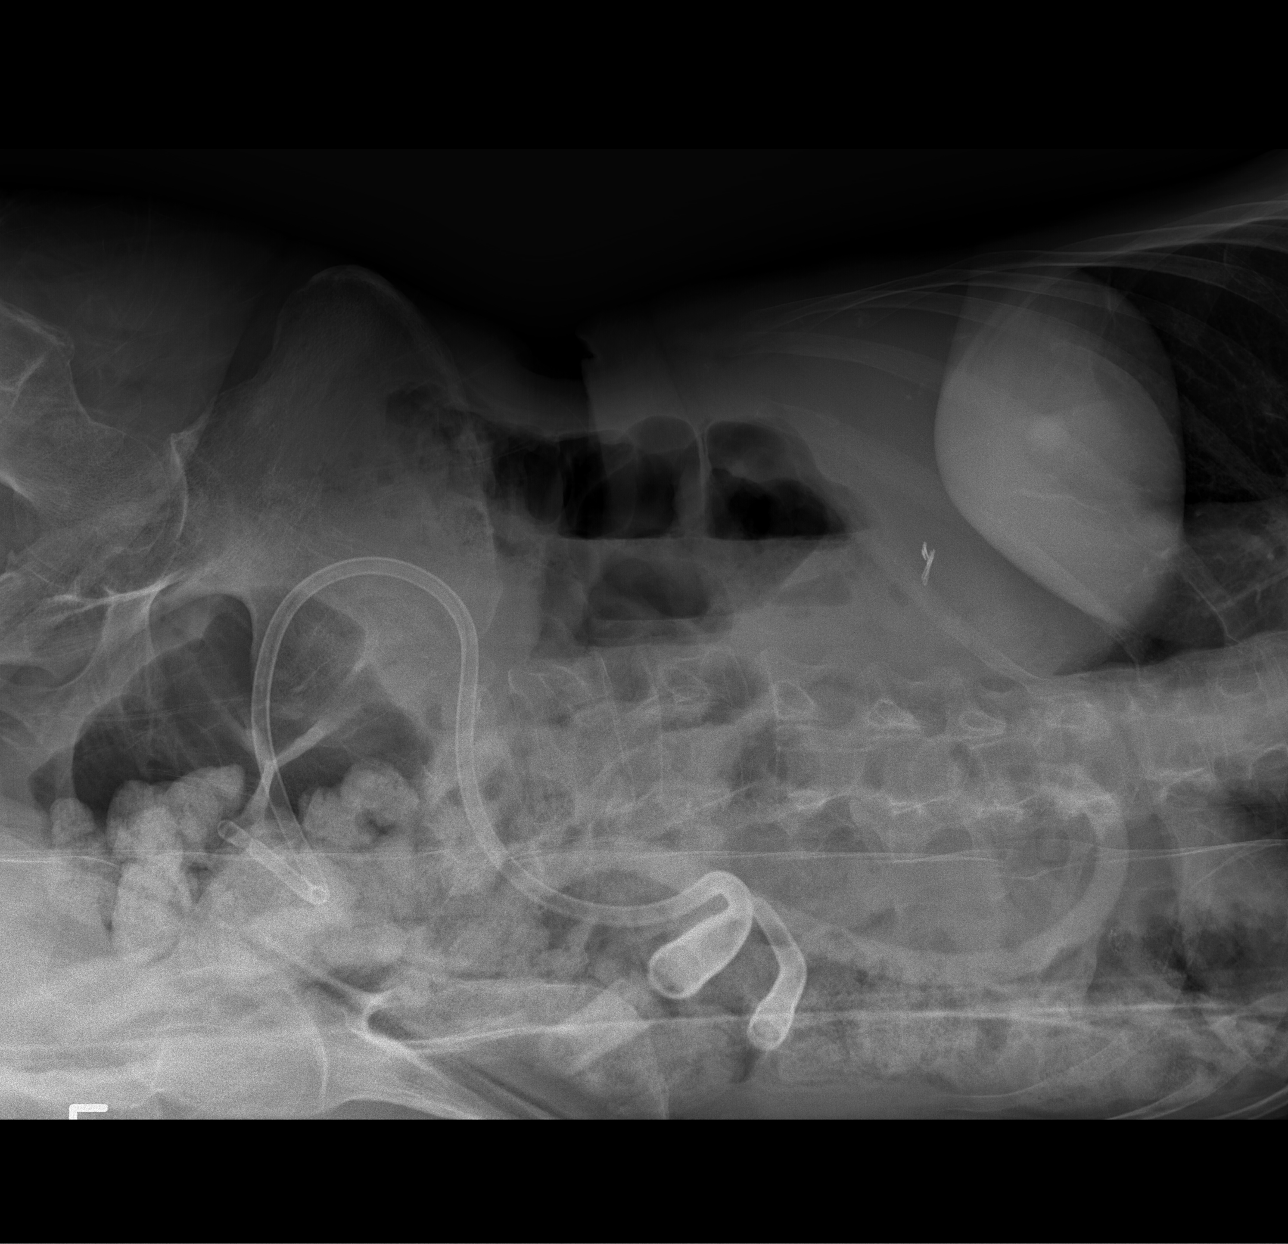

[view not recorded (1 of 2)]
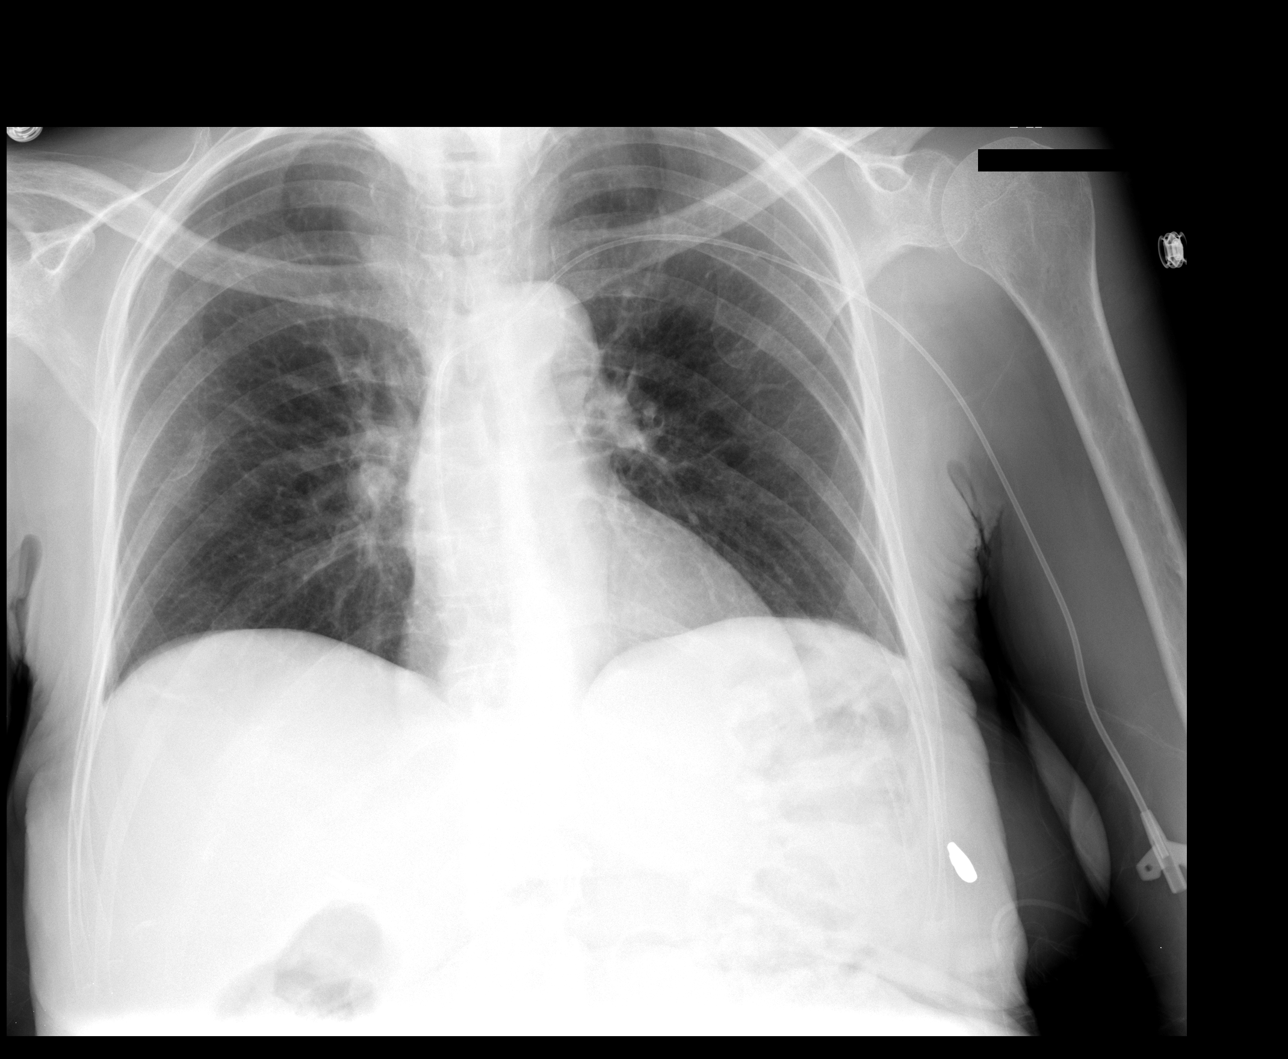

[view not recorded (2 of 2)]
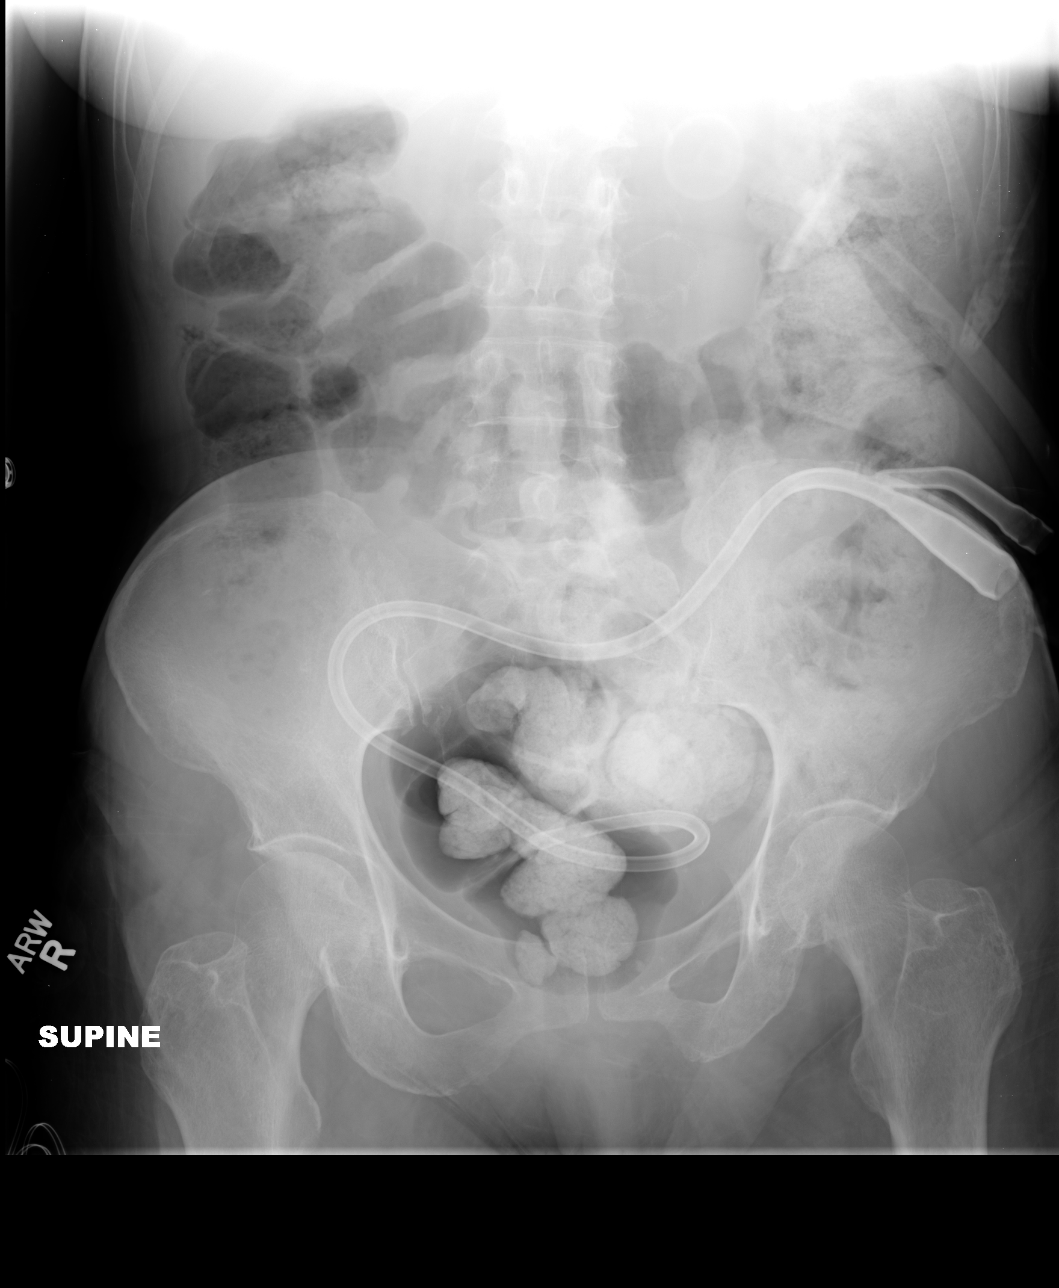

[3 of 3 positions shown; findings below may reference images not displayed]

FINDINGS: Left PICC line remains in place, unchanged.  Tip is at
the cavoatrial junction. Heart and mediastinal contours are within
normal limits.  No focal opacities or effusions.  No acute bony
abnormality.

Jejunostomy feeding catheter is again noted in the lower pelvis.
Large stool burden throughout the colon.  No obstruction or free
air.  No organomegaly or suspicious calcification.  Prior
cholecystectomy.
IMPRESSION: Large stool burden throughout the colon.  No obstruction or free
air.  No acute findings.
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
## 2011-08-30 ENCOUNTER — Other Ambulatory Visit: Payer: Self-pay | Admitting: Internal Medicine

## 2011-08-31 IMAGING — CR DG ABDOMEN ACUTE W/ 1V CHEST
3 series · 3 of 3 positions shown · non-contrast
Comparison: Abdominal film 04/05/2010

CLINICAL DATA: Hypokalemia.

ACUTE ABDOMEN SERIES (ABDOMEN 2 VIEW & CHEST 1 VIEW)

[view not recorded (1 of 3)]
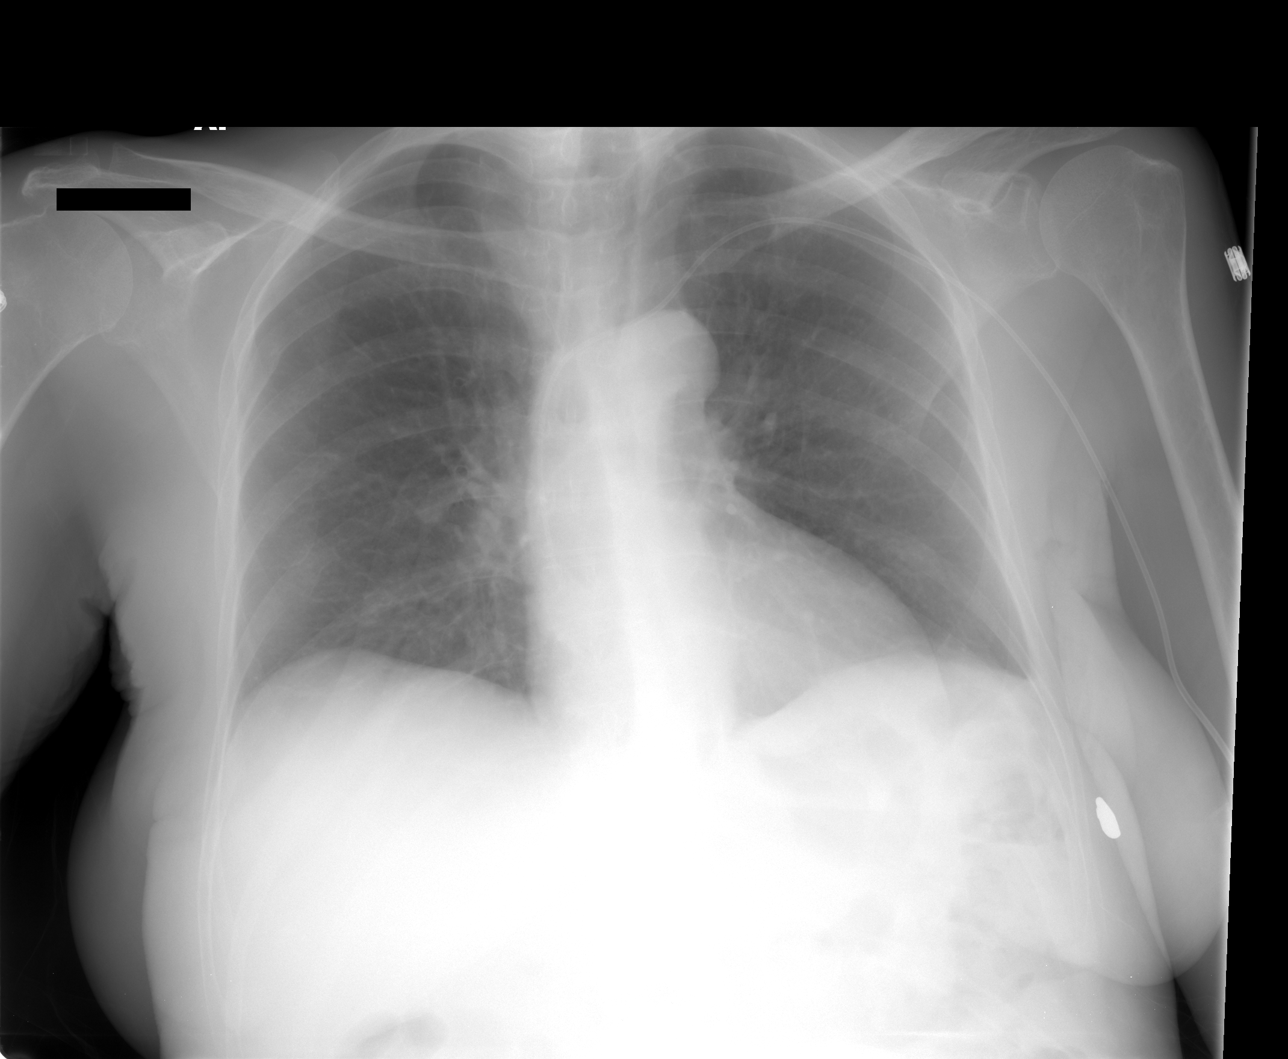

[view not recorded (2 of 3)]
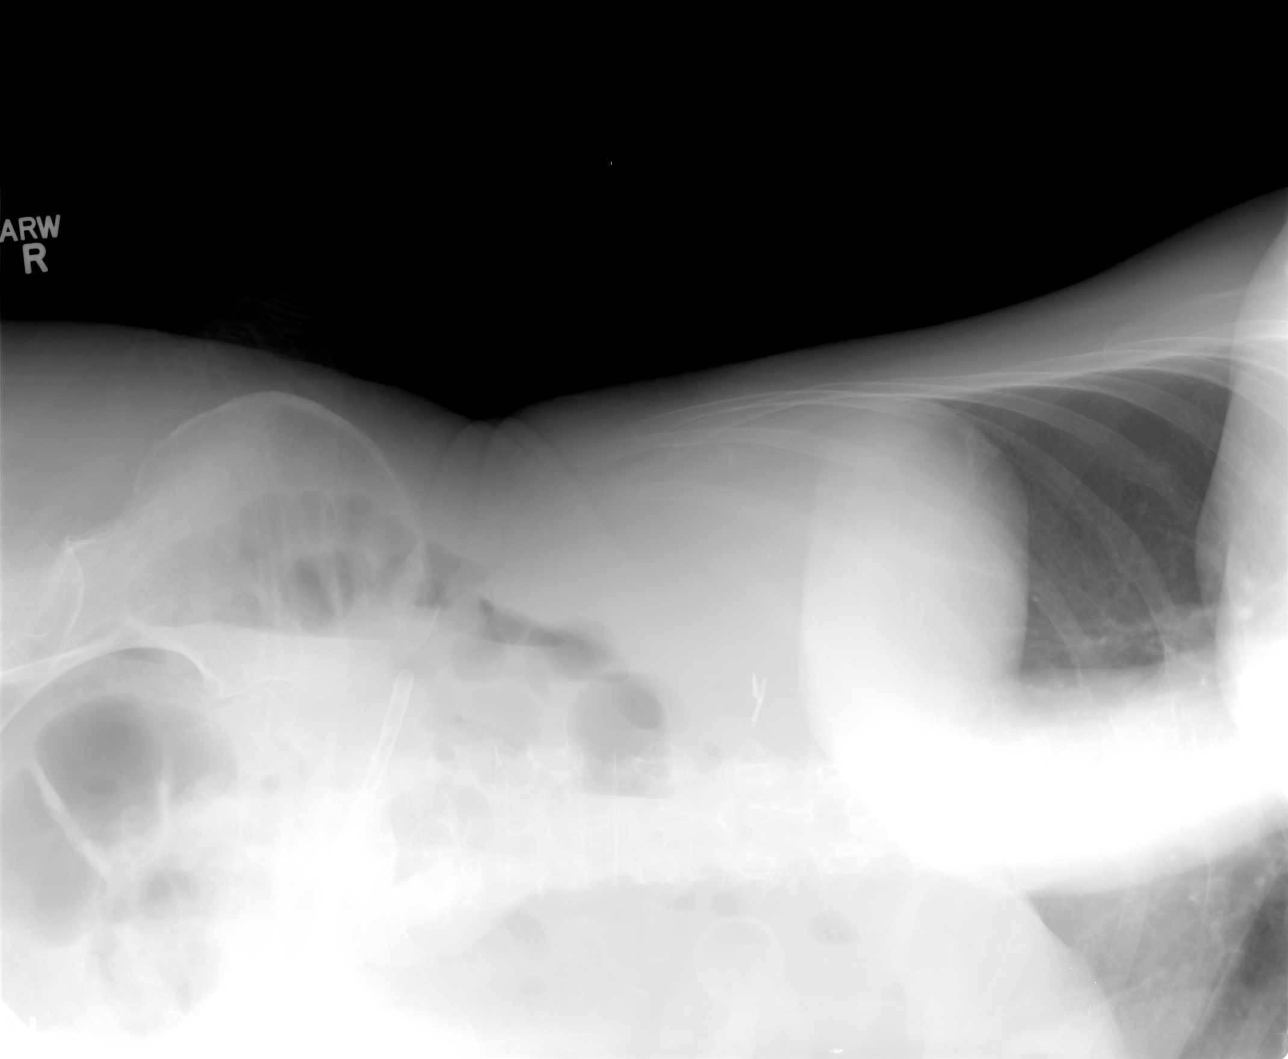

[view not recorded (3 of 3)]
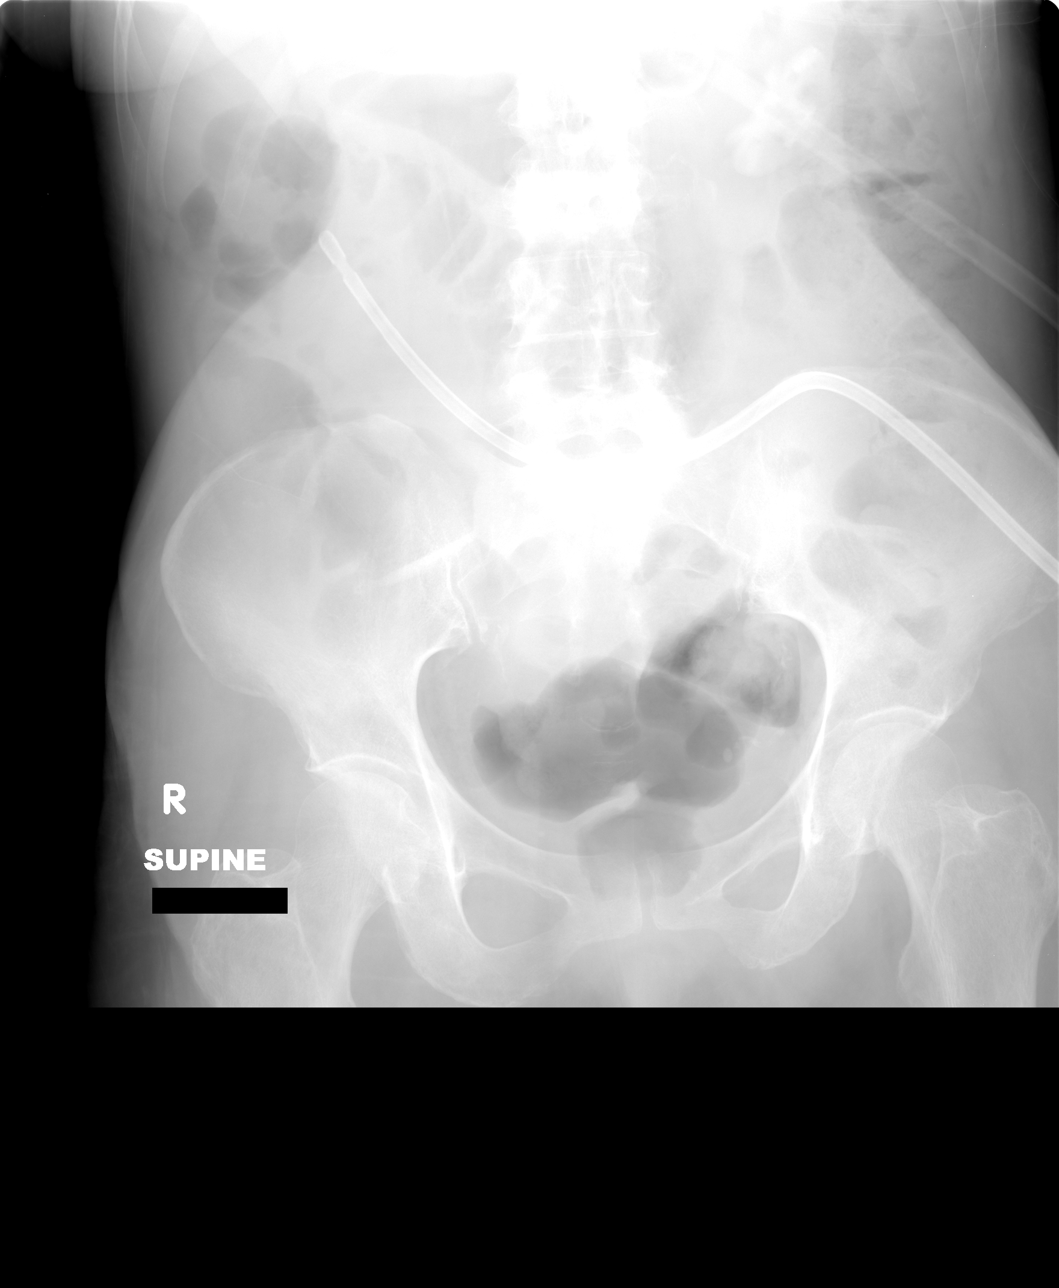

[3 of 3 positions shown; findings below may reference images not displayed]

FINDINGS: Left-sided PICC line is in good position.  Lungs are
clear.  No free air beneath hemidiaphragms.

Left lateral decubitus view demonstrates no intraperitoneal free
air.  No dilated loops of large or small bowel.  There is a
percutaneous gastrostomy tube and percutaneous enteric tube which
are unchanged.
IMPRESSION: 1.  No acute cardiopulmonary process.
2.  No evidence of bowel obstruction or intraperitoneal free air.
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
## 2011-09-13 IMAGING — NM NM LIVER FUNCTION STUDY
1 series · 6 of 6 positions shown · non-contrast
Comparison: None

CLINICAL DATA: Prior cholecystectomy.  Nausea, vomiting.

NUCLEAR MEDICINE HEPATOBILIARY IMAGING
TECHNIQUE: Sequential images of the abdomen were obtained [DATE] minutes following intravenous administration of
radiopharmaceutical.
Radiopharmaceutical:  4.8 mCi 4c-CCm Choletec

[Series 1: antr · 4.46mm/px · 6 of 60 frames shown]
[frame 6/60]
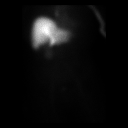
[frame 16/60]
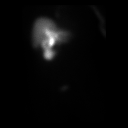
[frame 26/60]
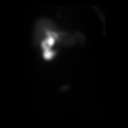
[frame 36/60]
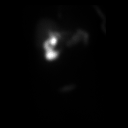
[frame 46/60]
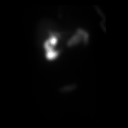
[frame 56/60]
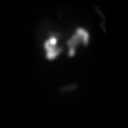

[6 of 6 positions shown; findings below may reference images not displayed]

FINDINGS: There is normal uptake and excretion of radiotracer by
the liver.  Activity is seen in the small bowel by 15 minutes.  No
evidence of common duct obstruction.
IMPRESSION: No evidence of common bile duct obstruction.

## 2011-09-13 IMAGING — US US ABDOMEN COMPLETE
1 series · 14 of 25 positions shown · non-contrast
Comparison: None.

CLINICAL DATA: Nausea and vomiting

COMPLETE ABDOMINAL ULTRASOUND

[Series 1: us abdomen complete · 0.26mm/px · 14 of 51 slices shown]
[im 1/51]
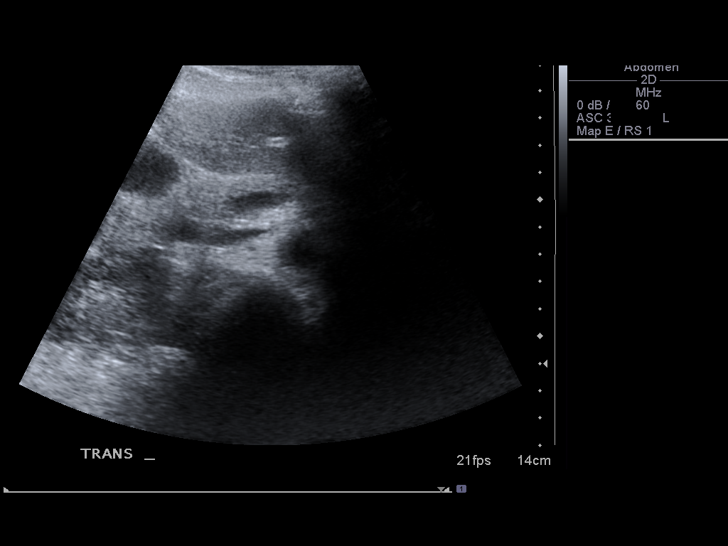
[im 5/51]
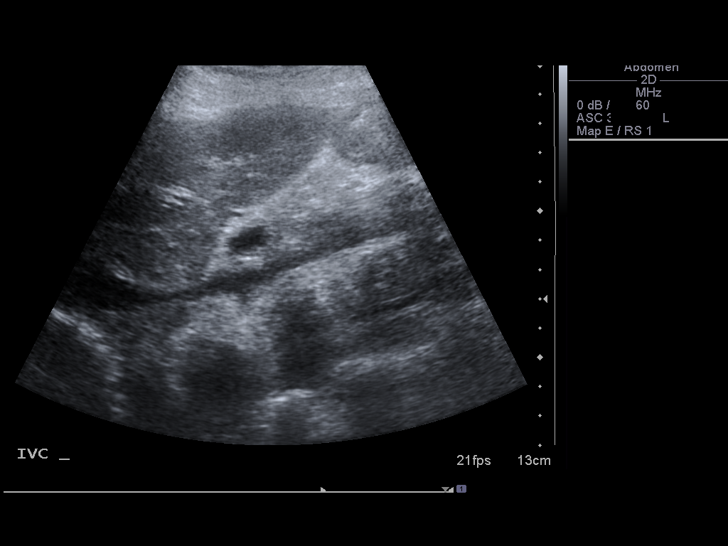
[im 9/51]
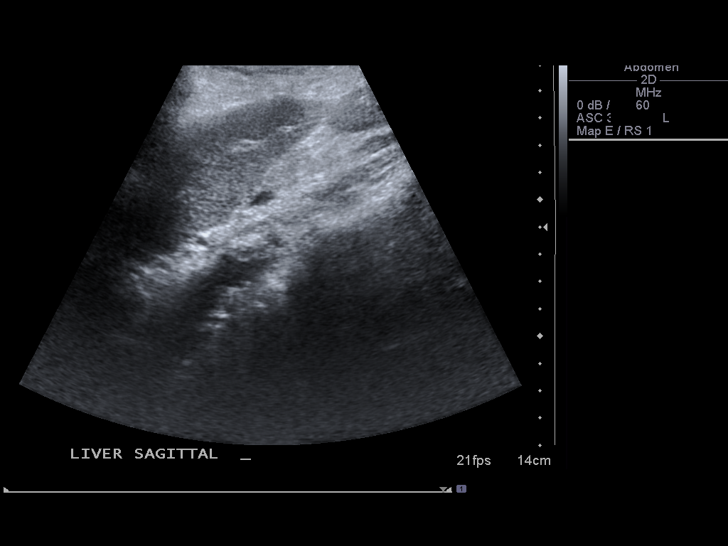
[im 13/51]
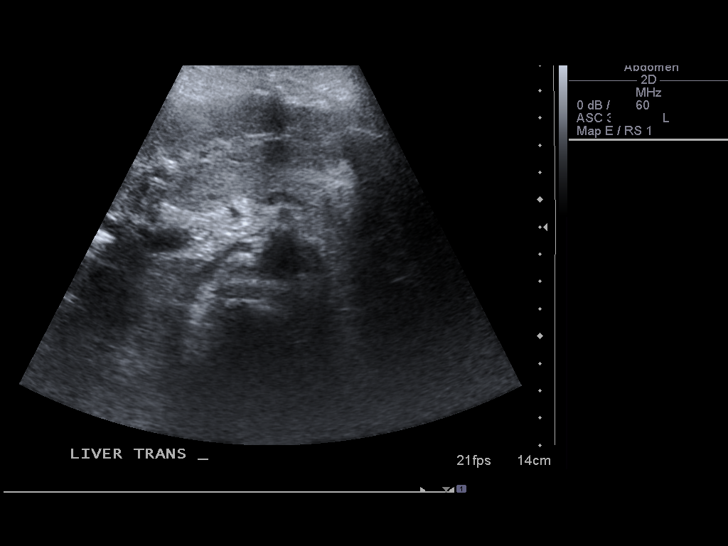
[im 17/51]
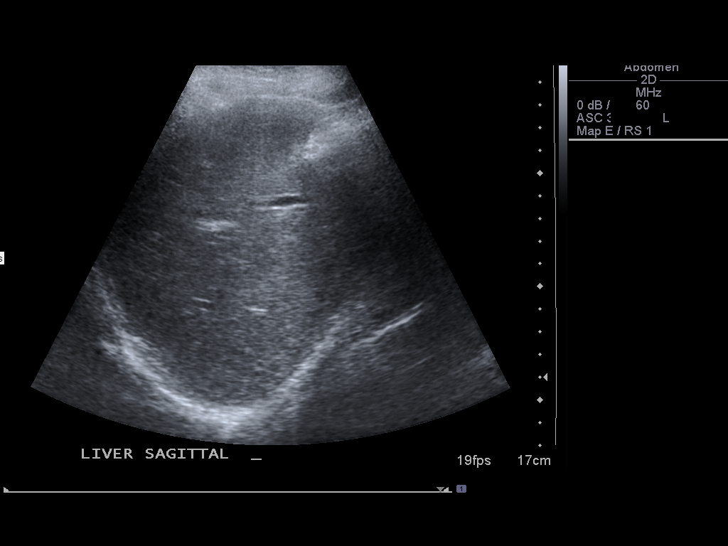
[im 19/51]
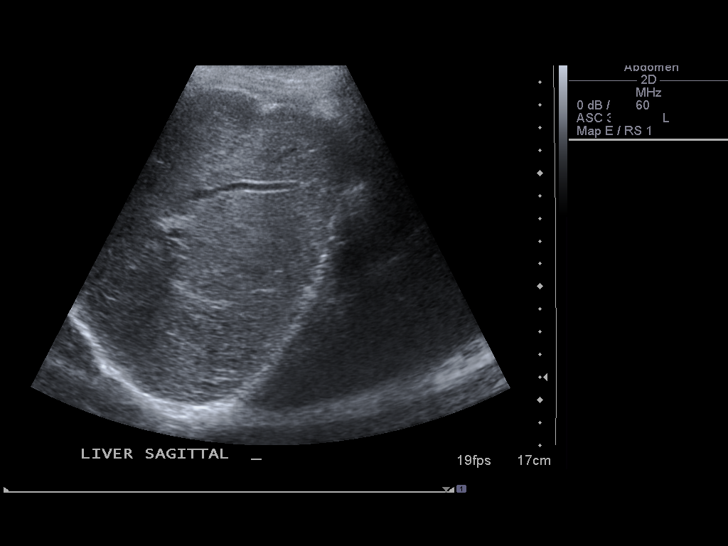
[im 23/51]
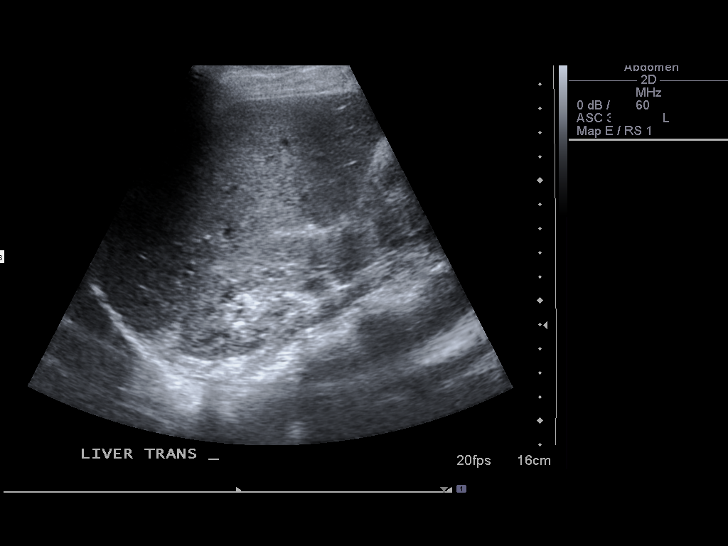
[im 28/51]
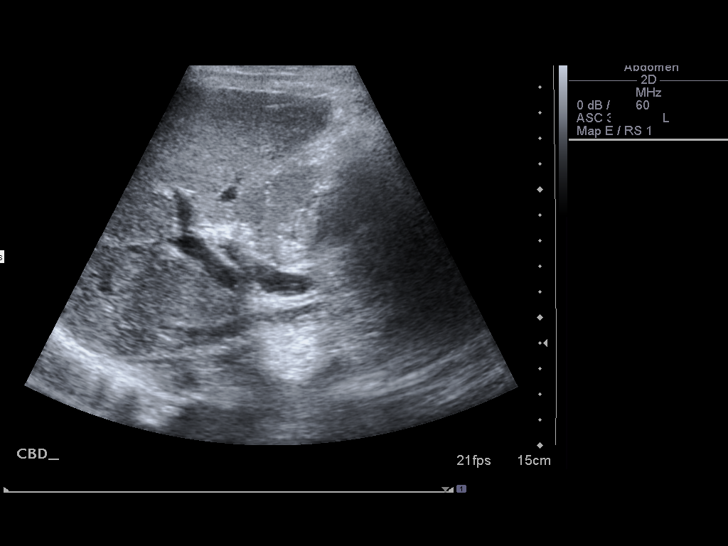
[im 32/51]
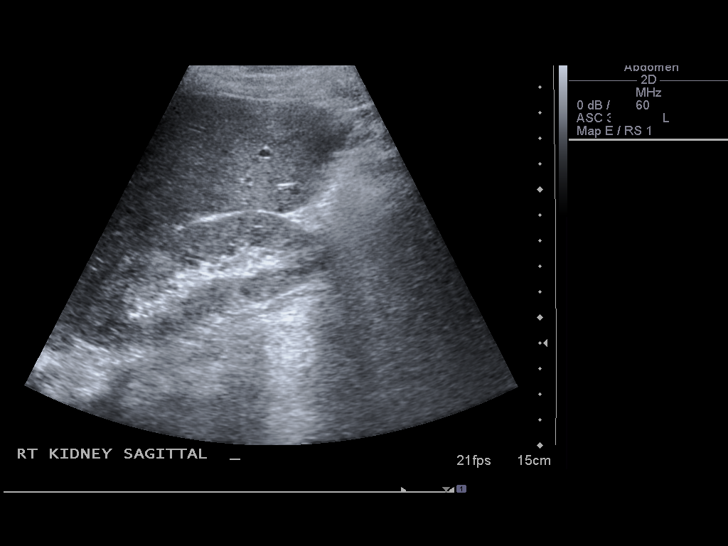
[im 34/51]
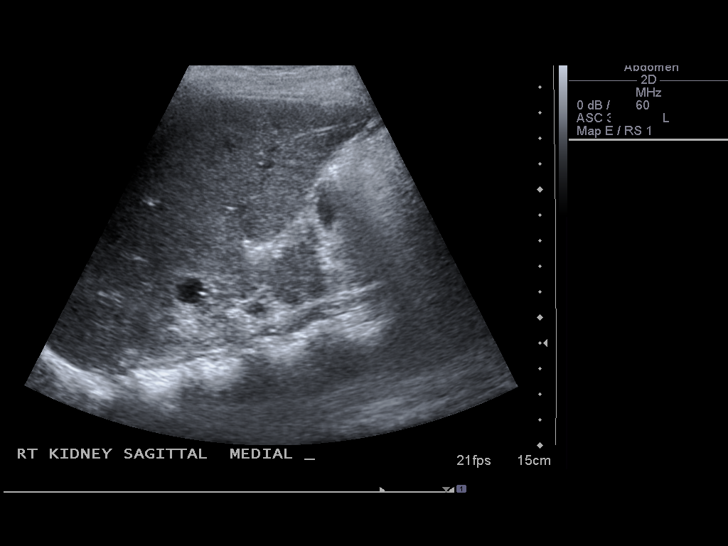
[im 38/51]
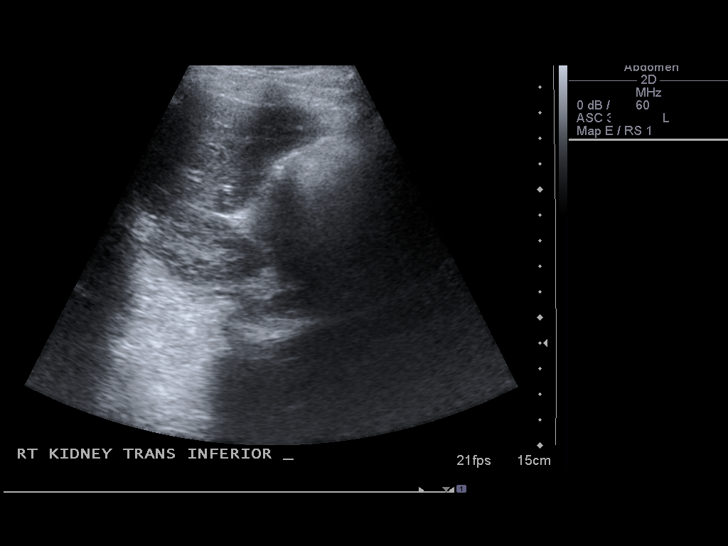
[im 42/51]
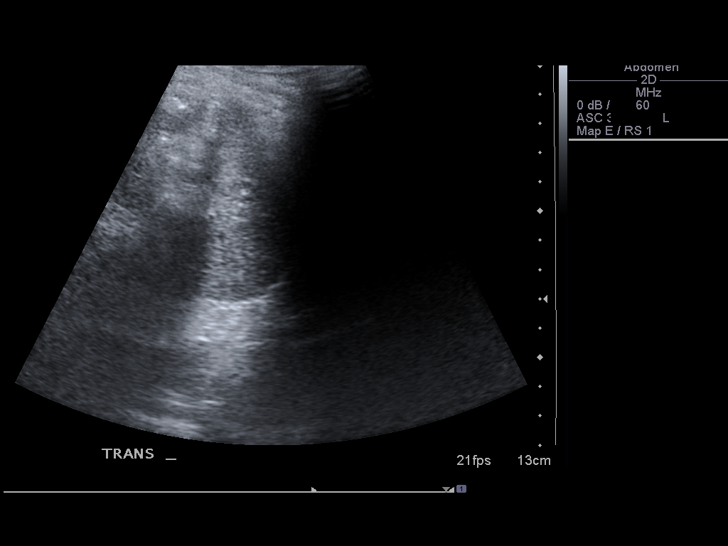
[im 46/51]
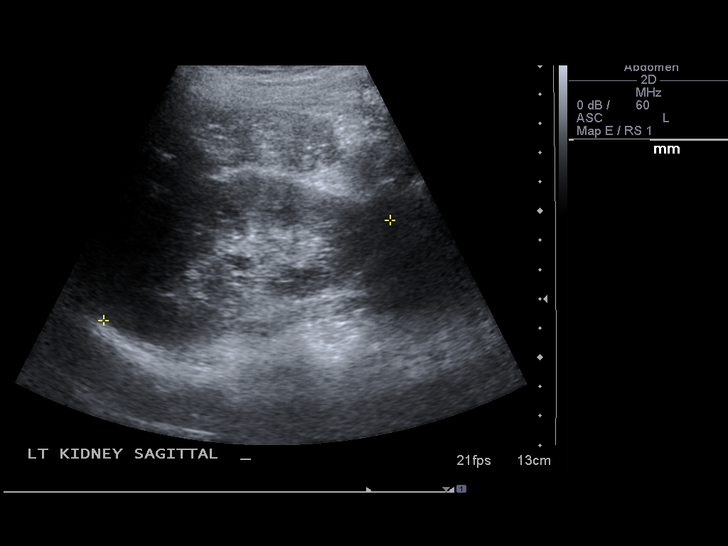
[im 51/51]
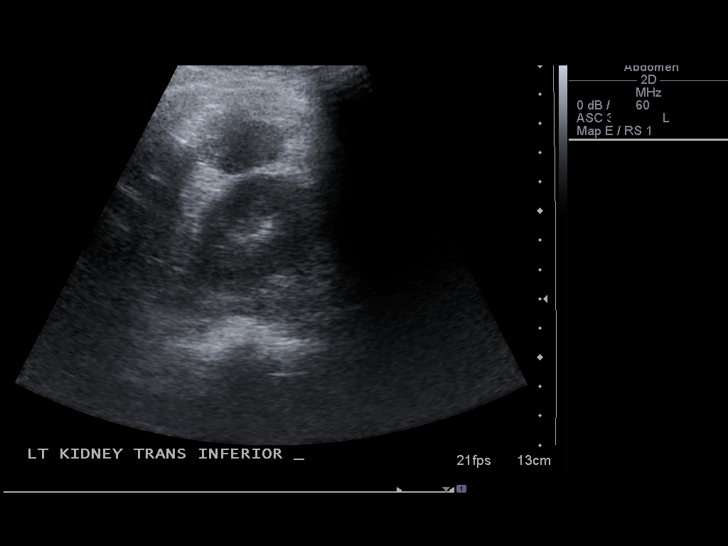

[14 of 25 positions shown; findings below may reference images not displayed]

FINDINGS: Gallbladder:  Surgically removed

Common bile duct:  10 mm, which is within normal limits post
cholecystectomy. Recommend correlation with liver function studies.

Liver:  No focal lesion identified.  Within normal limits in
parenchymal echogenicity.

IVC:  Normal

Pancreas:  Normal

Spleen:  Normal

Right Kidney:  10.1 cm.  No hydronephrosis.  There is a 1.1 cm
simple cyst noted.

Left Kidney:  10.3 cm.  No pathology.

Abdominal aorta:  Normal
IMPRESSION: 1.  Post cholecystectomy.
2.  Common bile duct 10 mm.  Probably normal post cholecystectomy.
Recommend clinical correlation.
3.  No other acute or significant findings.  There is a 1.1 cm
simple cyst of the right kidney.

## 2011-09-16 ENCOUNTER — Other Ambulatory Visit: Payer: Self-pay | Admitting: Internal Medicine

## 2011-09-16 IMAGING — RF DG REPLC DUODEN/JEJUNO TUBE PERCUT W/FLUORO
3 series · 3 of 3 positions shown · non-contrast
Comparison: none

CLINICAL DATA: Jejunostomy tube inadvertently removed

[Series 1: run · 1 of 1 slices shown (1 of 3)]
[im 1/1]
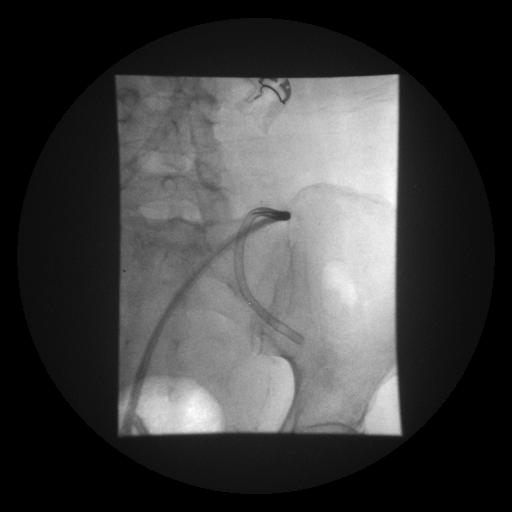

[Series 2: run · 1 of 1 slices shown (2 of 3)]
[im 1/1]
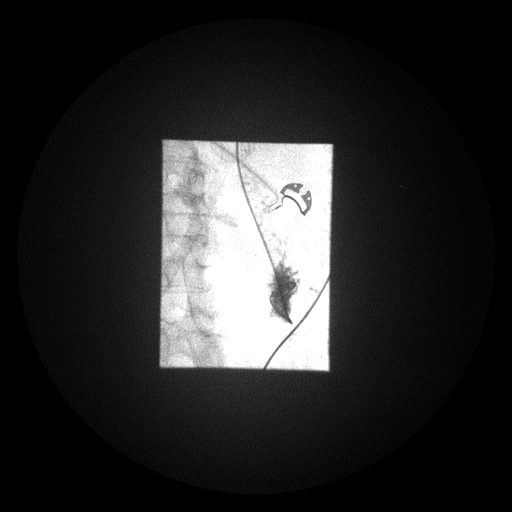

[Series 3: run · 1 of 1 slices shown (3 of 3)]
[im 1/1]
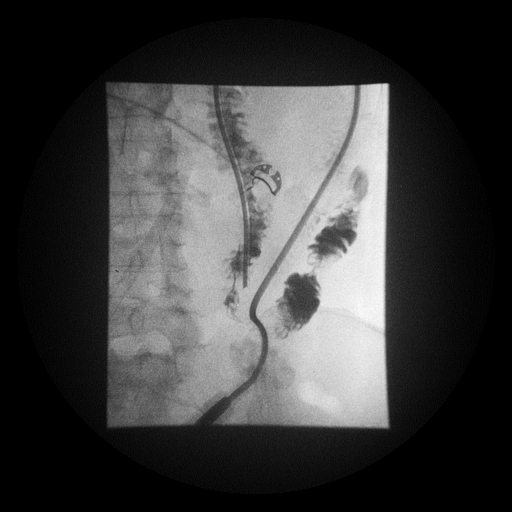

[3 of 3 positions shown; findings below may reference images not displayed]

REPLACE D/J TUBE W/FLOURO

Procedure: A Kumpe catheter was utilized to inject the jejunostomy
tube site opacifying the jejunum.  A Kumpe catheter was advanced
over a Benson wire into the jejunum.  The Kumpe was then exchanged
over the wire for an 18-French jejunostomy tube.  The balloon was
insufflated with 5 ml saline.  Contrast was injected.
FINDINGS: The image demonstrates replacement of the jejunostomy
tube.
IMPRESSION: Successful jejunostomy tube replacement.

## 2011-10-05 IMAGING — CR DG ABDOMEN ACUTE W/ 1V CHEST
4 series · 4 of 4 positions shown · non-contrast
Comparison: 04/22/2010

CLINICAL DATA: Vomiting.  Abdominal pain.  Headache.

ACUTE ABDOMEN SERIES (ABDOMEN 2 VIEW & CHEST 1 VIEW)

[w chest pa]
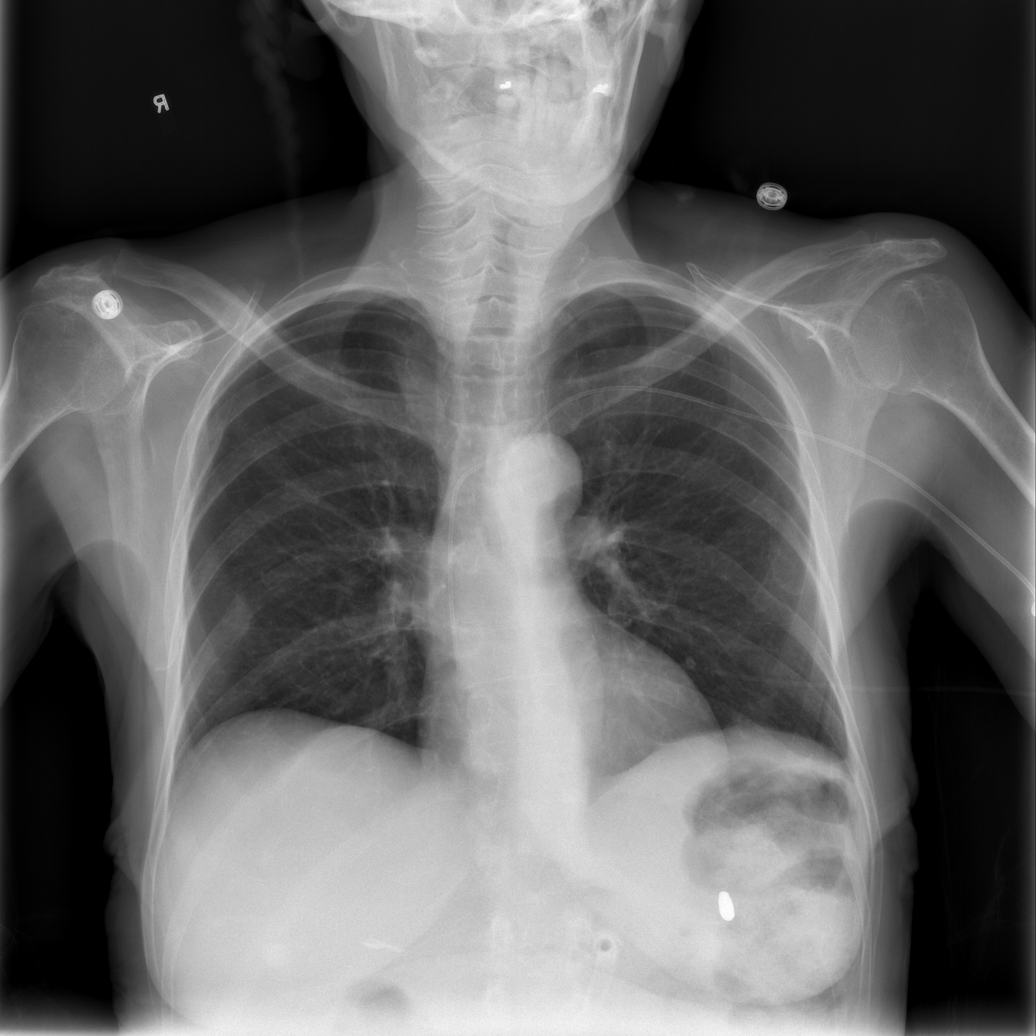

[w abdomen decub * (1 of 2)]
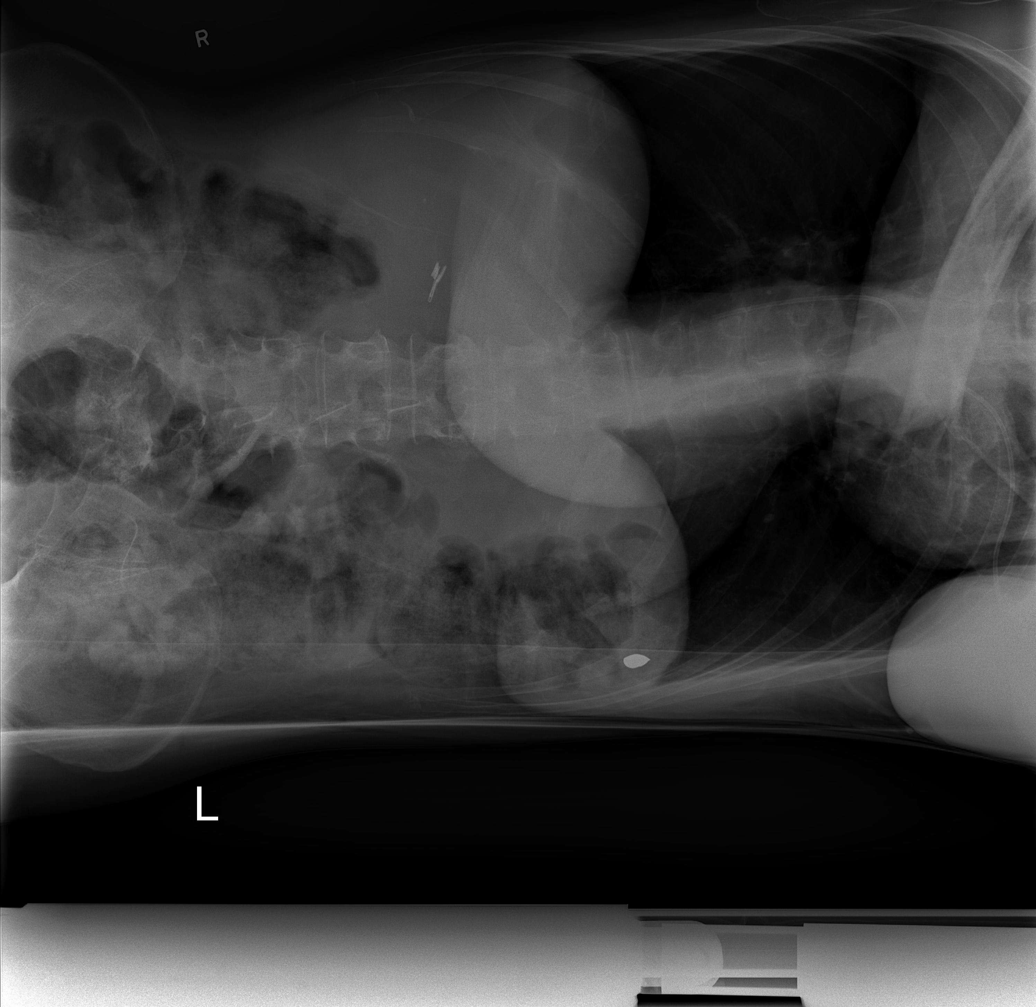

[w abdomen decub * (2 of 2)]
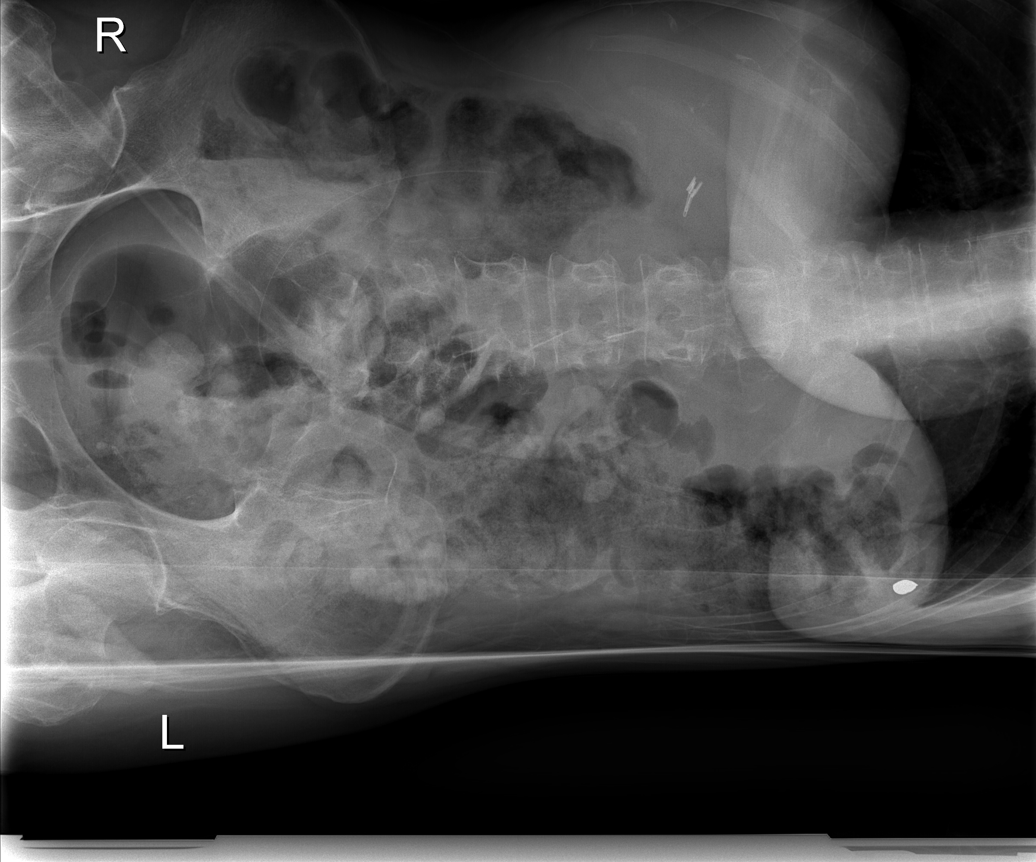

[view not recorded]
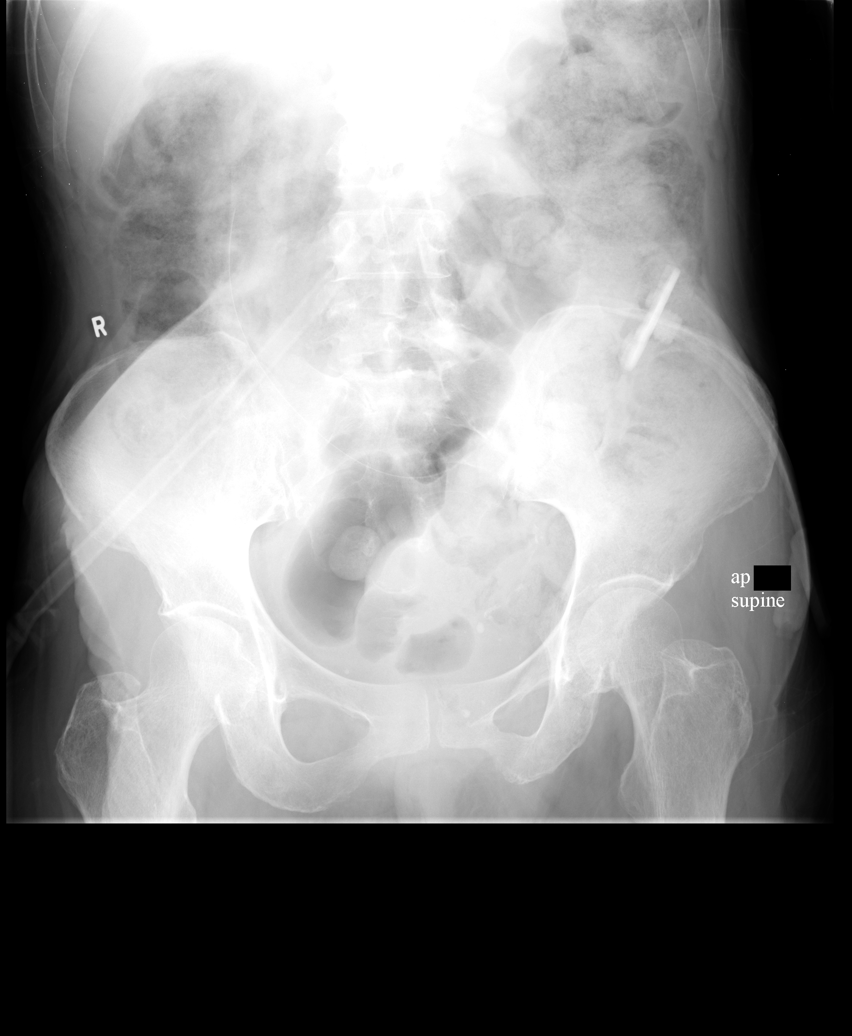

[4 of 4 positions shown; findings below may reference images not displayed]

FINDINGS: Percutaneous gastrostomy tube and peritoneal dialysis
catheter are again seen.  Surgical clips are seen from prior
cholecystectomy.  There is no evidence of dilated bowel loops.  A
large colonic stool burden is noted.  No evidence of free air.

Low lung volumes are seen.  Both lungs are clear.  Several old
right rib fractures are noted.  Left arm PICC line is seen with the
tip at the cavoatrial junction.
IMPRESSION: No acute findings.  Large colonic stool burden noted.

## 2011-10-17 ENCOUNTER — Other Ambulatory Visit: Payer: Self-pay | Admitting: Internal Medicine

## 2011-10-25 ENCOUNTER — Encounter (HOSPITAL_COMMUNITY): Payer: Self-pay | Admitting: Emergency Medicine

## 2011-10-25 ENCOUNTER — Emergency Department (HOSPITAL_COMMUNITY): Payer: Medicare Other

## 2011-10-25 ENCOUNTER — Emergency Department (HOSPITAL_COMMUNITY)
Admission: EM | Admit: 2011-10-25 | Discharge: 2011-10-26 | Disposition: A | Discharge status: Home / Self Care | Payer: Medicare Other | Attending: Emergency Medicine | Admitting: Emergency Medicine

## 2011-10-25 DIAGNOSIS — S2249XA Multiple fractures of ribs, unspecified side, initial encounter for closed fracture: Secondary | ICD-10-CM | POA: Insufficient documentation

## 2011-10-25 DIAGNOSIS — Y92009 Unspecified place in unspecified non-institutional (private) residence as the place of occurrence of the external cause: Secondary | ICD-10-CM | POA: Insufficient documentation

## 2011-10-25 DIAGNOSIS — Y9301 Activity, walking, marching and hiking: Secondary | ICD-10-CM | POA: Insufficient documentation

## 2011-10-25 DIAGNOSIS — S2232XA Fracture of one rib, left side, initial encounter for closed fracture: Secondary | ICD-10-CM

## 2011-10-25 DIAGNOSIS — K589 Irritable bowel syndrome without diarrhea: Secondary | ICD-10-CM | POA: Insufficient documentation

## 2011-10-25 DIAGNOSIS — W010XXA Fall on same level from slipping, tripping and stumbling without subsequent striking against object, initial encounter: Secondary | ICD-10-CM | POA: Insufficient documentation

## 2011-10-25 DIAGNOSIS — M79609 Pain in unspecified limb: Secondary | ICD-10-CM | POA: Insufficient documentation

## 2011-10-25 DIAGNOSIS — I10 Essential (primary) hypertension: Secondary | ICD-10-CM | POA: Insufficient documentation

## 2011-10-25 DIAGNOSIS — Z8739 Personal history of other diseases of the musculoskeletal system and connective tissue: Secondary | ICD-10-CM | POA: Insufficient documentation

## 2011-10-25 DIAGNOSIS — G4730 Sleep apnea, unspecified: Secondary | ICD-10-CM | POA: Insufficient documentation

## 2011-10-25 DIAGNOSIS — Z79899 Other long term (current) drug therapy: Secondary | ICD-10-CM | POA: Insufficient documentation

## 2011-10-25 DIAGNOSIS — E119 Type 2 diabetes mellitus without complications: Secondary | ICD-10-CM | POA: Insufficient documentation

## 2011-10-25 DIAGNOSIS — S62339A Displaced fracture of neck of unspecified metacarpal bone, initial encounter for closed fracture: Secondary | ICD-10-CM | POA: Insufficient documentation

## 2011-10-25 MED ORDER — OXYCODONE-ACETAMINOPHEN 5-325 MG PO TABS
1.0000 | ORAL_TABLET | Freq: Once | ORAL | Status: AC
  Administered 2011-10-25: 1 via ORAL
  Filled 2011-10-25: qty 1

## 2011-10-25 NOTE — ED Notes (Signed)
Pt states she tripped over carpet and fell on Friday.  C/o pain to L ribs and L 5th digit.  Denies LOC.  Pt is vomiting at triage- pt reports that she vomits "all the time" and states that this is not new and that she is not here for the vomiting.

## 2011-10-26 MED ORDER — OXYCODONE-ACETAMINOPHEN 5-325 MG PO TABS: 1.0000 | ORAL_TABLET | ORAL | Status: AC | PRN
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
## 2011-10-26 NOTE — Discharge Instructions (Signed)
Boxer's Fracture You have a break (fracture) of the fifth metacarpal bone. This is commonly called a boxer's fracture. This is the bone in the hand where the little finger attaches. The fracture is in the end of that bone, closest to the little finger. It is usually caused when you hit an object with a clenched fist. Often, the knuckle is pushed down by the impact. Sometimes, the fracture rotates out of position. A boxer's fracture will usually heal within 6 weeks, if it is treated properly and protected from re-injury. Surgery is sometimes needed. A cast, splint, or bulky hand dressing may be used to protect and immobilize a boxer's fracture. Do not remove this device or dressing until your caregiver approves. Keep your hand elevated, and apply ice packs for 15 to 20 minutes every 2 hours, for the first 2 days. Elevation and ice help reduce swelling and relieve pain. See your caregiver, or an orthopedic specialist, for follow-up care within the next 10 days. This is to make sure your fracture is healing properly. Document Released: 04/20/2005 Document Revised: 04/09/2011 Document Reviewed: 10/08/2006 [facility] Patient Information 2012 [place], [place].Cast or Splint Care Casts and splints support injured limbs and keep bones from moving while they heal.  HOME CARE  Keep the cast or splint uncovered during the drying period.   A plaster cast can take 24 to 48 hours to dry.   A fiberglass cast will dry in less than 1 hour.   Do not rest the cast on anything harder than a pillow for 24 hours.   Do not put weight on your injured limb. Do not put pressure on the cast. Wait for your doctor's approval.   Keep the cast or splint dry.   Cover the cast or splint with a plastic bag during baths or wet weather.   If you have a cast over your chest and belly (trunk), take sponge baths until the cast is taken off.   Keep your cast or splint clean. Wash a dirty cast with a damp cloth.   Do not put any  objects under your cast or splint. Do not scratch the skin under the cast with an object.   Do not take out the padding from inside your cast.   Exercise your joints near the cast as told by your doctor.   Raise (elevate) your injured limb on 1 or 2 pillows for the first 1 to 3 days.  GET HELP RIGHT AWAY IF:  Your cast or splint cracks.   Your cast or splint is too tight or too loose.   You itch badly under the cast.   Your cast gets wet or has a soft spot.   You have a bad smell coming from the cast.   You get an object stuck under the cast.   Your skin around the cast becomes red or raw.   You have new or more pain after the cast is put on.   You have fluid leaking through the cast.   You cannot move your fingers or toes.   Your fingers or toes turn colors or are cool, painful, or puffy (swollen).   You have tingling or lose feeling (numbness) around the injured area.   You have pain or pressure under the cast.   You have trouble breathing or have shortness of breath.   You have chest pain.  MAKE SURE YOU:  Understand these instructions.   Will watch your condition.   Will get help right  away if you are not doing well or get worse.  Document Released: 08/20/2010 Document Revised: 04/09/2011 Document Reviewed: 08/20/2010 [facility] Patient Information 2012 [place], [place].Rib Fracture Your caregiver has diagnosed you as having a rib fracture (a break). This can occur by a blow to the chest, by a fall against a hard object, or by violent coughing or sneezing. There may be one or many breaks. Rib fractures may heal on their own within 3 to 8 weeks. The longer healing period is usually associated with a continued cough or other aggravating activities. HOME CARE INSTRUCTIONS   Avoid strenuous activity. Be careful during activities and avoid bumping the injured rib. Activities that cause pain pull on the fracture site(s) and are best avoided if possible.   Eat a normal,  well-balanced diet. Drink plenty of fluids to avoid constipation.   Take deep breaths several times a day to keep lungs free of infection. Try to cough several times a day, splinting the injured area with a pillow. This will help prevent pneumonia.   Do not wear a rib belt or binder. These restrict breathing which can lead to pneumonia.   Only take over-the-counter or prescription medicines for pain, discomfort, or fever as directed by your caregiver.  SEEK MEDICAL CARE IF:  You develop a continual cough, associated with thick or bloody sputum. SEEK IMMEDIATE MEDICAL CARE IF:   You have a fever.   You have difficulty breathing.   You have nausea (feeling sick to your stomach), vomiting, or abdominal (belly) pain.   You have worsening pain, not controlled with medications.  Document Released: 04/20/2005 Document Revised: 04/09/2011 Document Reviewed: 09/22/2006 [facility] Patient Information 2012 [place], [place].

## 2011-10-26 NOTE — ED Provider Notes (Signed)
Medical screening examination/treatment/procedure(s) were performed by non-physician practitioner and as supervising physician I was immediately available for consultation/collaboration.   Joya Gaskins, MD 10/26/11 [phone]

## 2011-11-07 ENCOUNTER — Other Ambulatory Visit: Payer: Self-pay | Admitting: Internal Medicine

## 2011-11-10 ENCOUNTER — Other Ambulatory Visit: Payer: Self-pay | Admitting: Internal Medicine

## 2011-11-10 NOTE — Telephone Encounter (Signed)
Per Dr Juanda Chance, patient may have refills of medications. No office visit needed at this time.
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
## 2011-12-22 IMAGING — RF DG ESOPHAGUS
12 of 15 series · 12 of 15 positions shown · non-contrast
Comparison: None

CLINICAL DATA: Solid food dysphagia. History of gastrectomy.

ESOPHOGRAM/BARIUM SWALLOW
TECHNIQUE: Single contrast examination was performed using thin
barium and barium tablet.
Fluoroscopy time:  1.3 minutes.

[Series 1: run · 1 of 1 slices shown (1 of 12)]
[im 1/1]
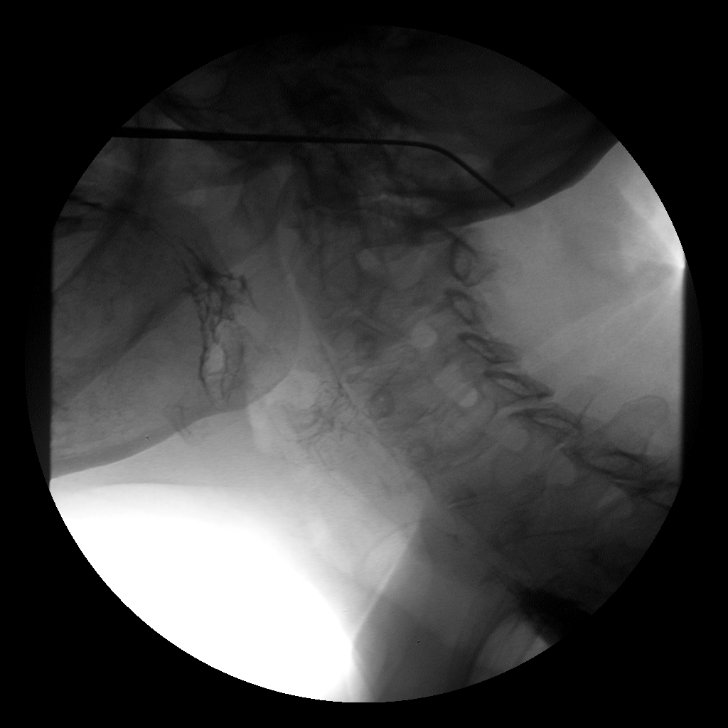

[Series 2: run · 1 of 1 slices shown (2 of 12)]
[im 1/1]
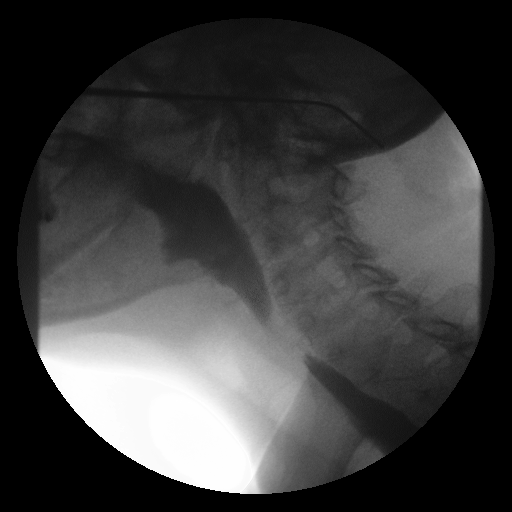

[Series 4: run · 1 of 1 slices shown (3 of 12)]
[im 1/1]
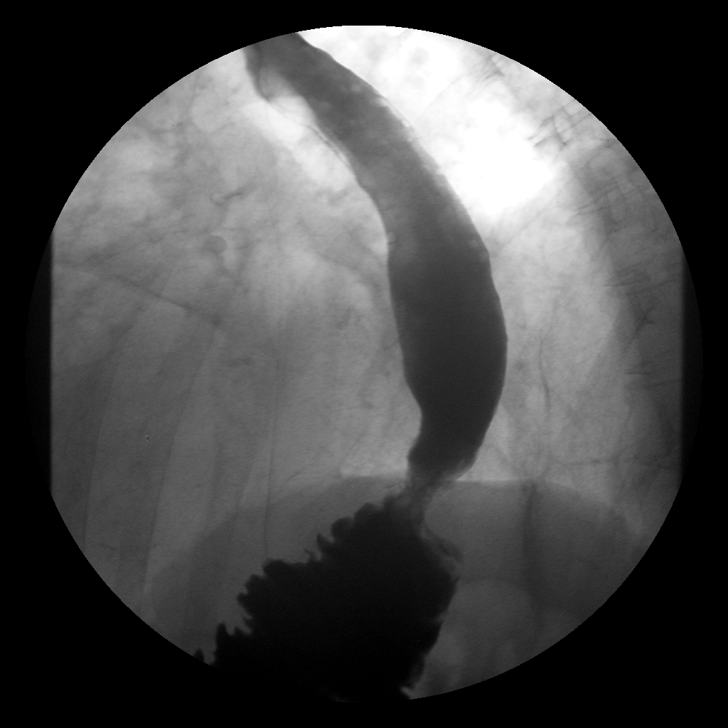

[Series 5: run · 1 of 1 slices shown (4 of 12)]
[im 1/1]
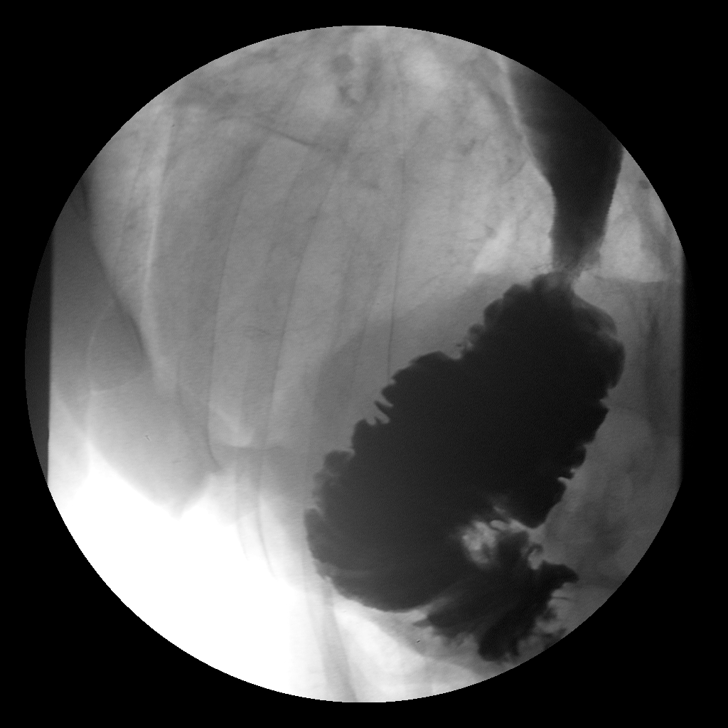

[Series 6: run · 1 of 1 slices shown (5 of 12)]
[im 1/1]
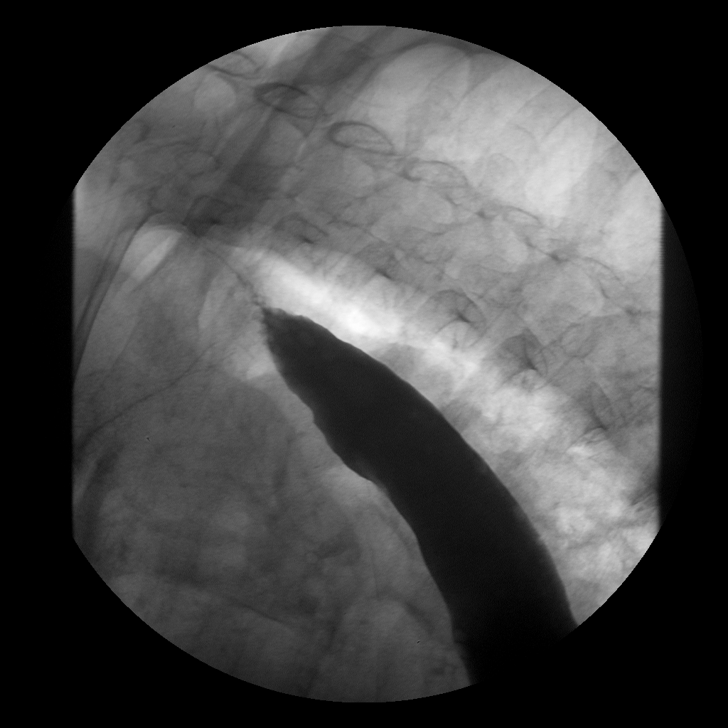

[Series 7: run · 1 of 1 slices shown (6 of 12)]
[im 1/1]
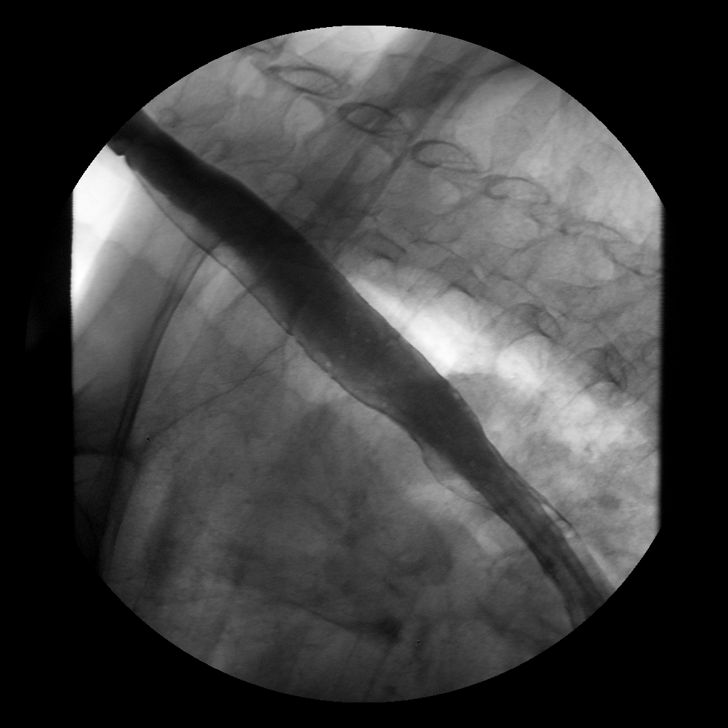

[Series 9: run · 1 of 1 slices shown (7 of 12)]
[im 1/1]
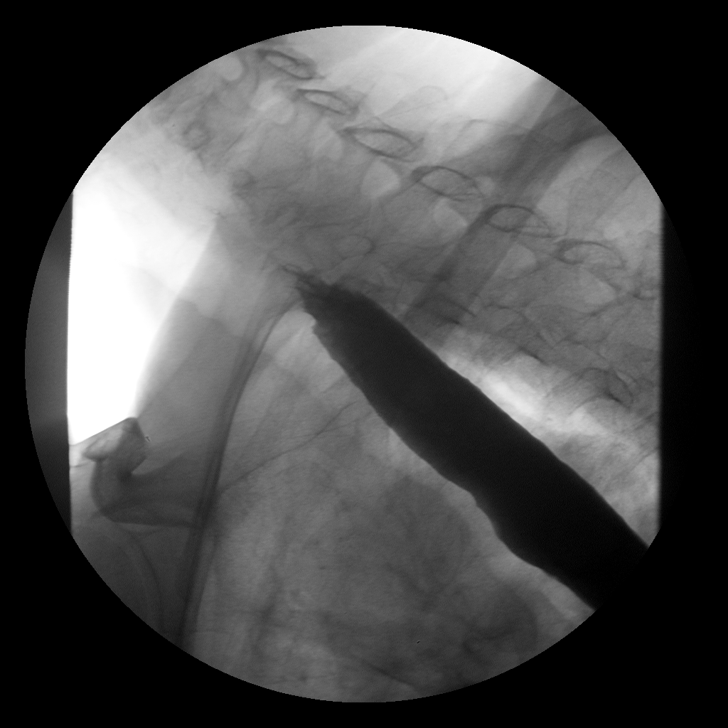

[Series 10: run · 1 of 1 slices shown (8 of 12)]
[im 1/1]
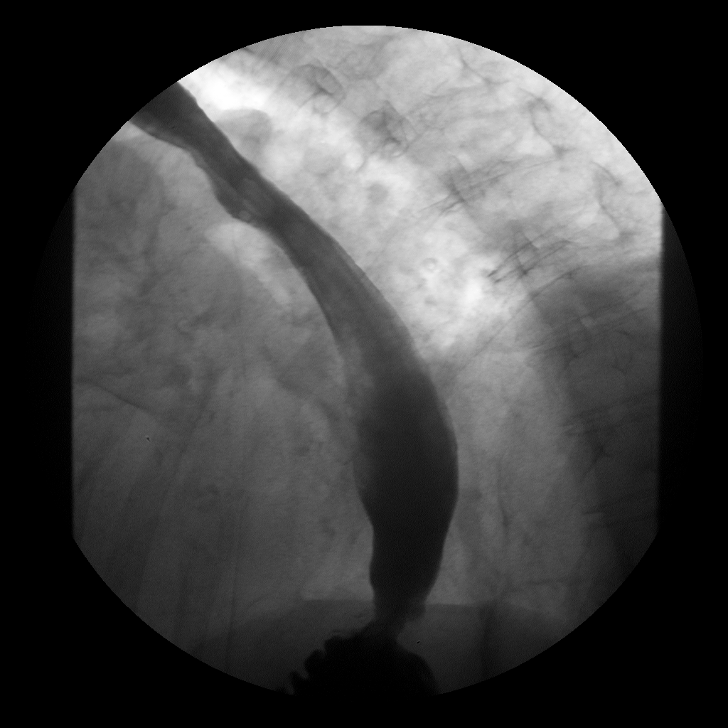

[Series 11: run · 1 of 1 slices shown (9 of 12)]
[im 1/1]
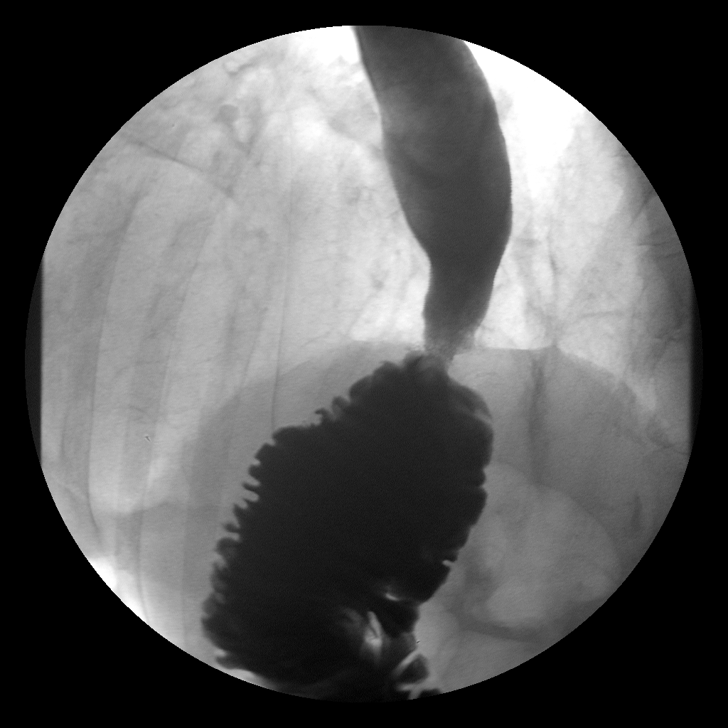

[Series 12: run · 1 of 1 slices shown (10 of 12)]
[im 1/1]
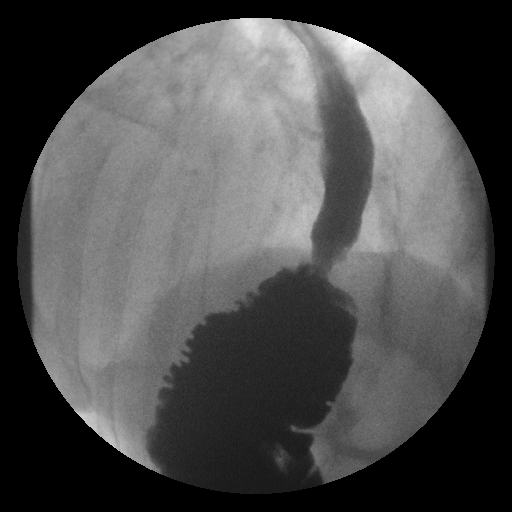

[Series 14: run · 1 of 1 slices shown (11 of 12)]
[im 1/1]
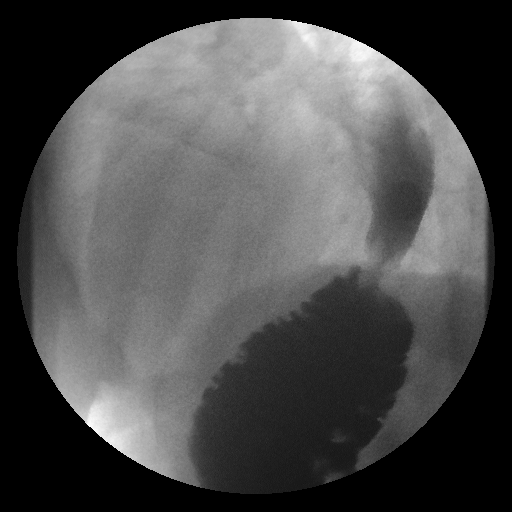

[Series 15: run · 1 of 1 slices shown (12 of 12)]
[im 1/1]
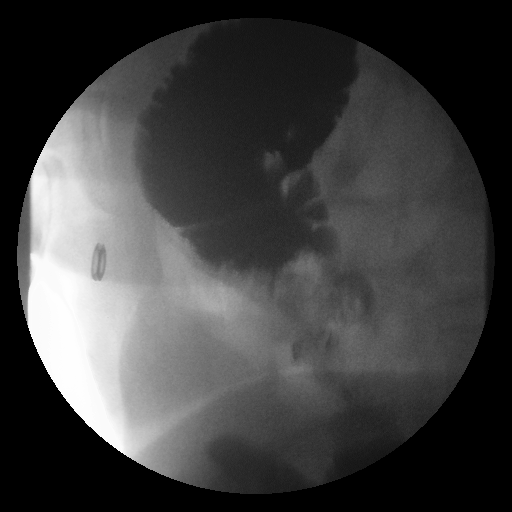

[12 of 15 positions shown; findings below may reference images not displayed]

FINDINGS: Please note that this study is limited secondary to the
patient's poor ability to stand and tolerate the oral contrast.

The pharynx is within normal limits.
Normal esophageal peristalsis is identified.
No fixed filling defects or areas of fixed narrowing are identified
within the esophagus.

Evidence of gastrectomy and esophago-enteric anastomosis is noted.
Contrast flows freely from the esophagus into the small bowel.
A barium tablet passes from the esophagus into the small bowel
without obstruction.

A moderate-large amount of reflux from the small bowel to the
cervical esophagus is noted.
IMPRESSION: Entero-esophageal reflux.

Gastrectomy and esophago-enteric anastomosis.

## 2011-12-27 ENCOUNTER — Other Ambulatory Visit: Payer: Self-pay | Admitting: Internal Medicine

## 2011-12-28 IMAGING — XA IR REPLC DUODEN/JEJUNO TUBE PERCUT W/FLUORO
1 series · 1 of 1 positions shown · non-contrast
Comparison: none

CLINICAL DATA: Chronic indwelling balloon retention jejunostomy
catheter has fallen out.

[Series 300: sp replc duoden/jejuno tube percut w/flu · 1 of 1 slices shown]
[im 1/1]
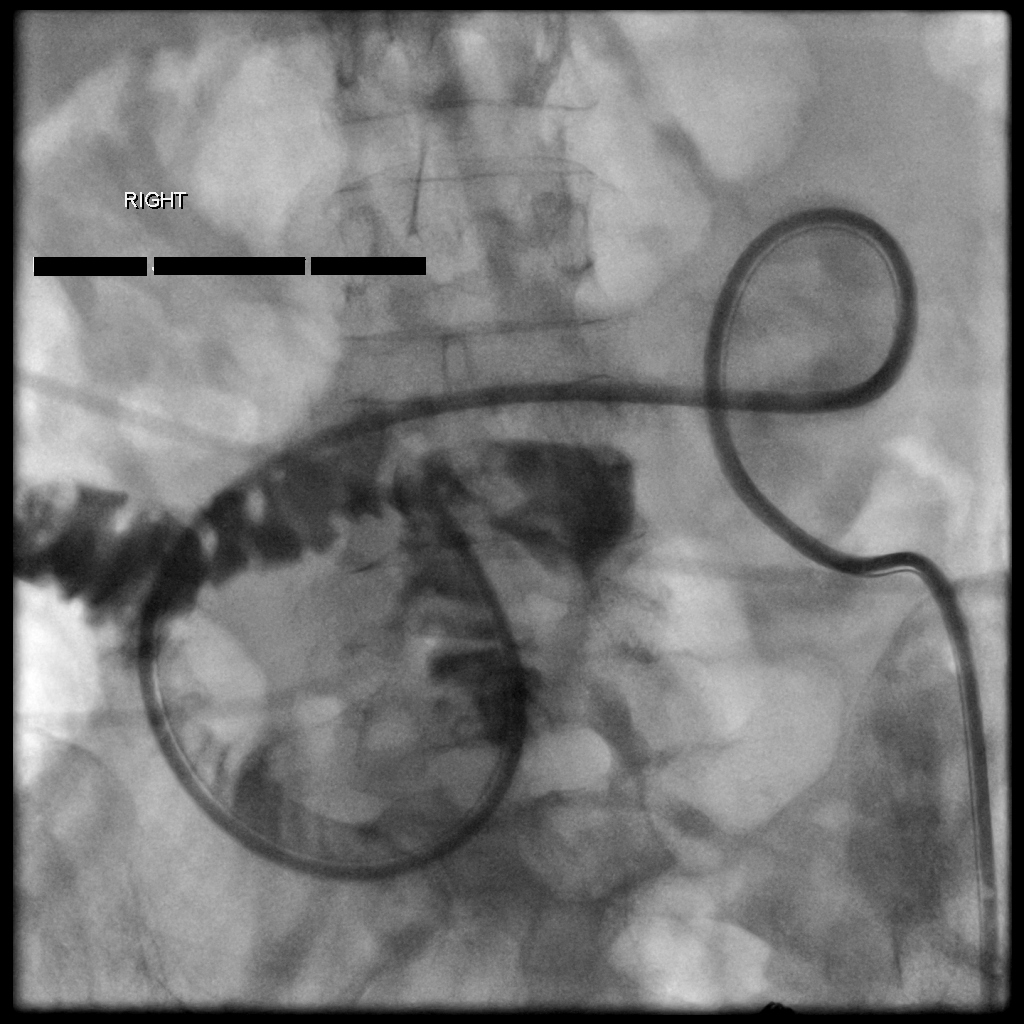

[1 of 1 positions shown; findings below may reference images not displayed]

JEJUNOSTOMY CATHETER EXCHANGE UNDER FLUOROSCOPY

Contrast:  10 ml Wmnipaque-NUU

Fluoroscopy Time: 3.7 minutes.

Procedure:  The procedure, risks, benefits, and alternatives were
explained to the patient.  Questions regarding the procedure were
encouraged and answered.  The patient understands and consents to
the procedure.

The left abdominal jejunostomy exit site was prepped with Betadine
in a sterile fashion, and a sterile drape was applied covering the
operative field.  A sterile gown and sterile gloves were used for
the procedure.

A 5-French multipurpose catheter was advanced through the exit
site.  Diluted contrast material was injected.  The catheter was
further advanced in the jejunum over a hydrophilic guide wire.  A
16-French balloon retention gastrostomy tube was placed.  Distal
end of the catheter was cut approximately 6 cm shorter than
original length.  The catheter was advanced over the guidewire.
Retention balloon was inflated with 7 ml of saline.

Final catheter position was confirmed with a fluoroscopic spot
image obtained after injection of contrast.

Complications:  None
FINDINGS: A new jejunostomy catheter was advanced well into the
jejunum and is well positioned for immediate use.
IMPRESSION: Replacement of percutaneous balloon retention jejunostomy catheter.
A new 16-French catheter was advanced into the jejunum.

## 2011-12-30 ENCOUNTER — Telehealth: Payer: Self-pay | Admitting: Internal Medicine

## 2011-12-30 ENCOUNTER — Other Ambulatory Visit: Payer: Self-pay | Admitting: Gastroenterology

## 2011-12-30 DIAGNOSIS — K3184 Gastroparesis: Secondary | ICD-10-CM

## 2011-12-30 NOTE — Telephone Encounter (Signed)
Spoke with Emily [name] in IR and patient to go to IR [facility] tomorrow AM at 8:00. Patient notified.

## 2011-12-30 NOTE — Telephone Encounter (Signed)
Patient calling to report her jejunal feeding tube is draining around the insertion site. States the drainage started several days ago and it is watery like. States she did notice if she drank [place] aid the drainage was the color of the [place] aid. Her jejunal tube was placed by IR. Should she be seen by IR to evaluate tube or GI? Please, advise.

## 2011-12-30 NOTE — Telephone Encounter (Signed)
IR

## 2011-12-31 ENCOUNTER — Telehealth: Payer: Self-pay | Admitting: *Deleted

## 2011-12-31 ENCOUNTER — Ambulatory Visit (HOSPITAL_COMMUNITY)
Admission: RE | Admit: 2011-12-31 | Discharge: 2011-12-31 | Disposition: A | Discharge status: Home / Self Care | Payer: Medicare Other | Source: Ambulatory Visit | Attending: Gastroenterology | Admitting: Gastroenterology

## 2011-12-31 ENCOUNTER — Other Ambulatory Visit: Payer: Self-pay | Admitting: Gastroenterology

## 2011-12-31 DIAGNOSIS — K3184 Gastroparesis: Secondary | ICD-10-CM

## 2011-12-31 DIAGNOSIS — K9403 Colostomy malfunction: Secondary | ICD-10-CM | POA: Insufficient documentation

## 2011-12-31 DIAGNOSIS — K9413 Enterostomy malfunction: Secondary | ICD-10-CM | POA: Insufficient documentation

## 2011-12-31 MED ORDER — IOHEXOL 300 MG/ML  SOLN
10.0000 mL | Freq: Once | INTRAMUSCULAR | Status: AC | PRN
  Administered 2011-12-31: 10 mL

## 2011-12-31 NOTE — Telephone Encounter (Signed)
Spoke with Emily [name] the tech that was in the room with patient and she states the tube was functioning when patient was there. The contrast that was injected did not move in 5-7 minutes. Questioning motility issues causing the leaking. Patient told them she was taking Reglan.  Verified with patient that she is not vomiting.Spoke with Emily Gip, PA and she recommends 1. Verify with patient that she is taking Reglan. 2. Keep dry dressing to area. 3.Keep bumper up against skin. Spoke with patient and gave her the recommendations. She states she is taking Reglan. She will let us know if these measures do not help the problem.

## 2011-12-31 NOTE — Telephone Encounter (Signed)
Patient states she went to IR and they did some work on her tube. It seem to work ok. When she got home, it is leaking again. Spoke with IR and they state patient needs to pull tube straight and push bumper down to skin. Also state the patient may be over filling.

## 2012-01-06 ENCOUNTER — Other Ambulatory Visit: Payer: Self-pay | Admitting: Internal Medicine

## 2012-01-08 IMAGING — CR DG ABDOMEN ACUTE W/ 1V CHEST
3 series · 3 of 3 positions shown · non-contrast
Comparison: 07/27/2010

CLINICAL DATA: Abdominal pain, nausea, and vomiting.

ACUTE ABDOMEN SERIES (ABDOMEN 2 VIEW & CHEST 1 VIEW)

[w chest pa]
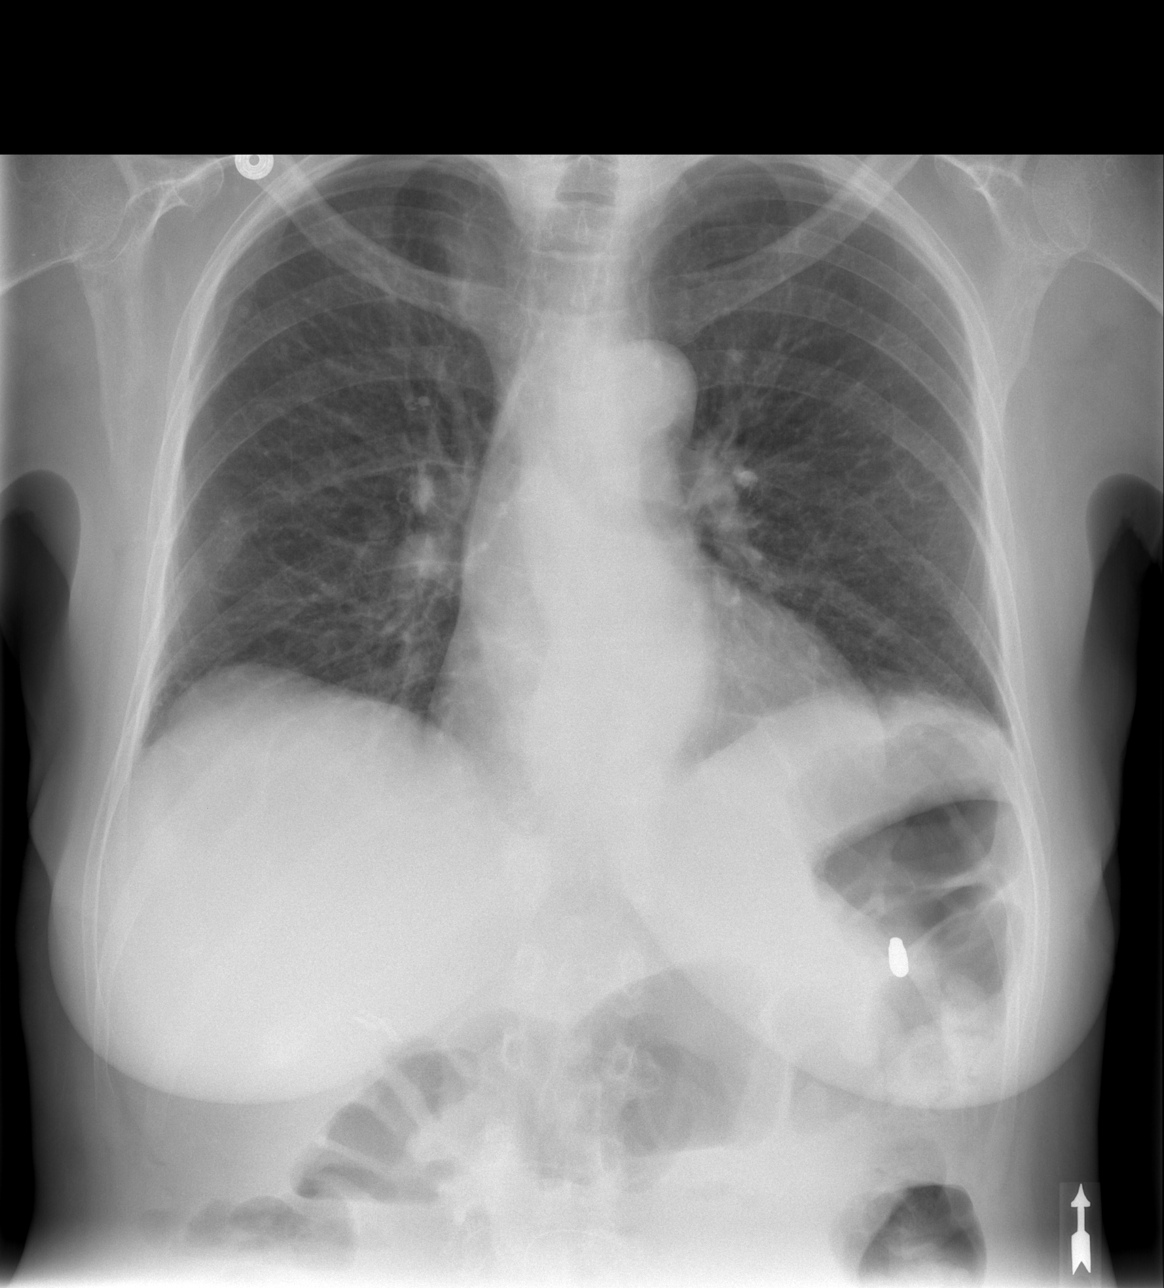

[w abdomen upright *]
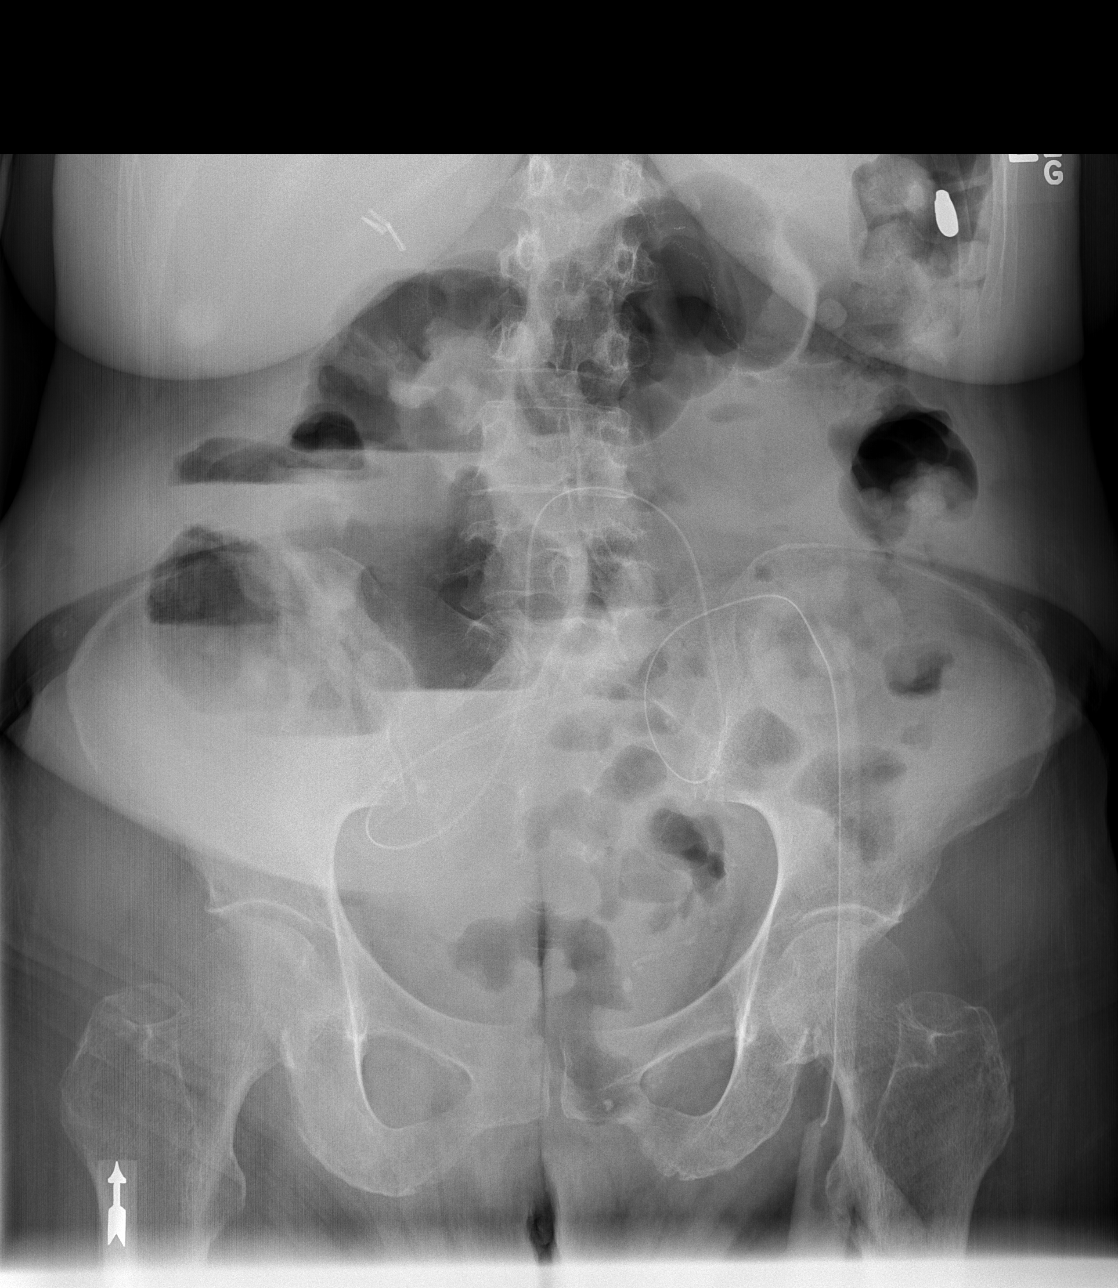

[t abdomen supine]
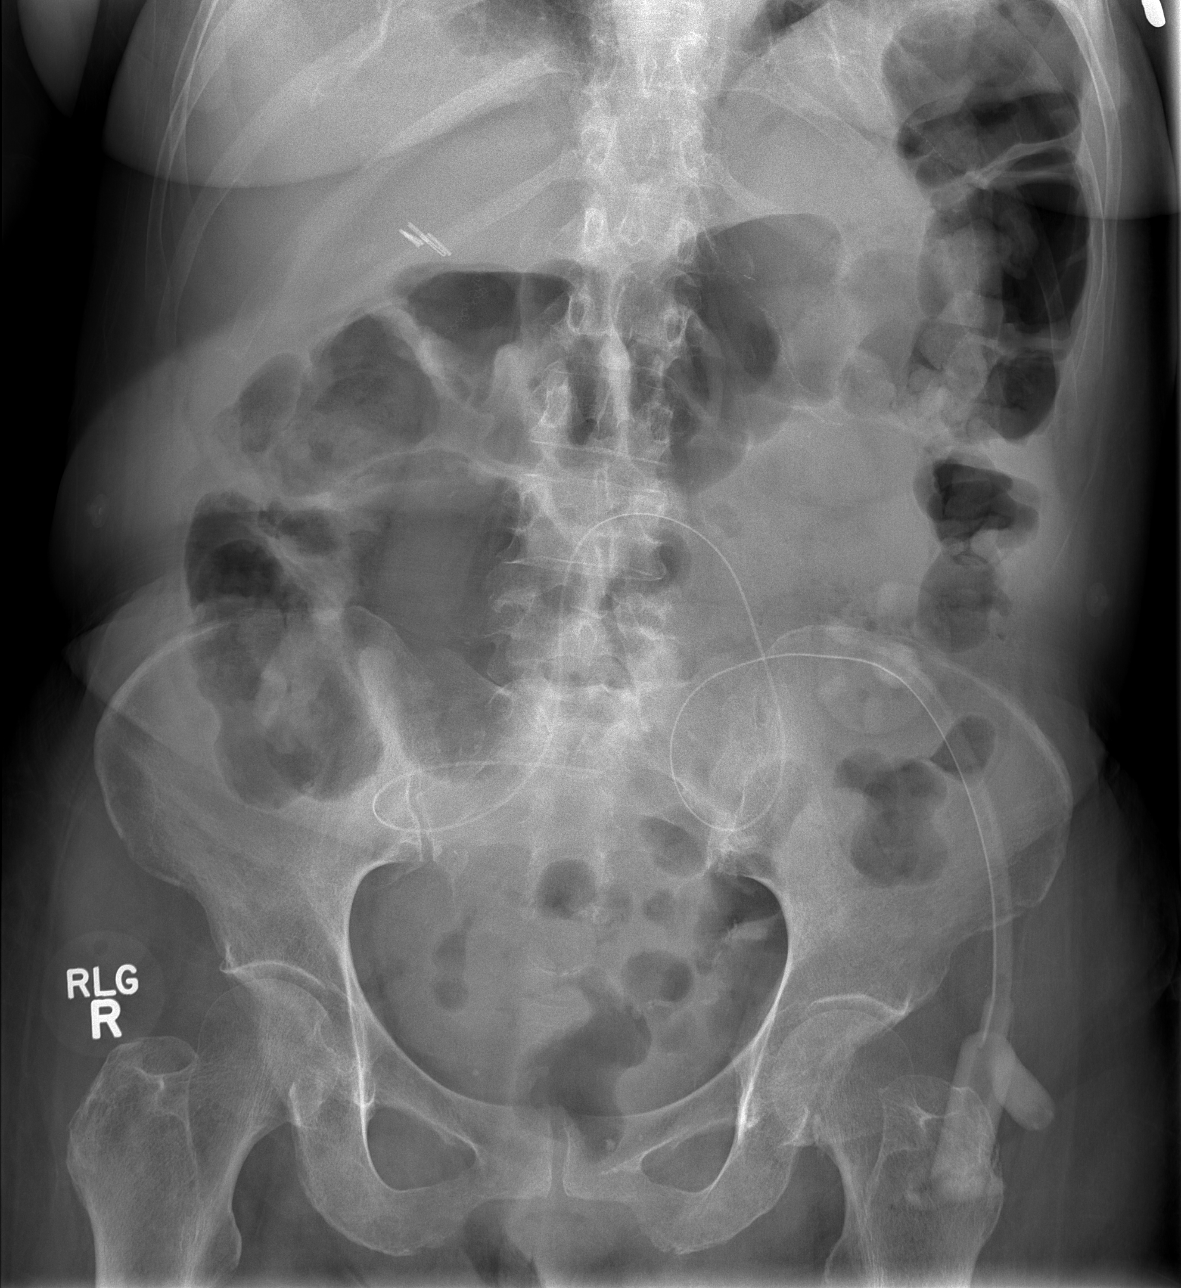

[3 of 3 positions shown; findings below may reference images not displayed]

FINDINGS: Shallow inspiration.  Normal heart size and pulmonary
vascularity.  No focal airspace disease.  No blunting of
costophrenic angles.  Old fractures of the right fourth and seventh
ribs.

Left lower quadrant percutaneous jejunostomy catheter.  Gas and
stool throughout the colon without distension.  No small bowel
dilatation.  No free intra-abdominal air.  Air fluid levels in the
colon.  No abnormal air fluid levels in the small bowel.  Surgical
clips in the right upper quadrant.  Vascular calcifications.  No
significant changes since the previous study.
IMPRESSION: No evidence of active pulmonary disease.  Nonobstructive bowel gas
pattern.  Left lower quadrant jejunostomy.

## 2012-01-15 IMAGING — CR DG CHEST 2V
2 series · 2 of 2 positions shown · non-contrast
Comparison: Plain film chest 04/04/2010.

CLINICAL DATA: Fever and cough.

CHEST - 2 VIEW

[w chest pa]
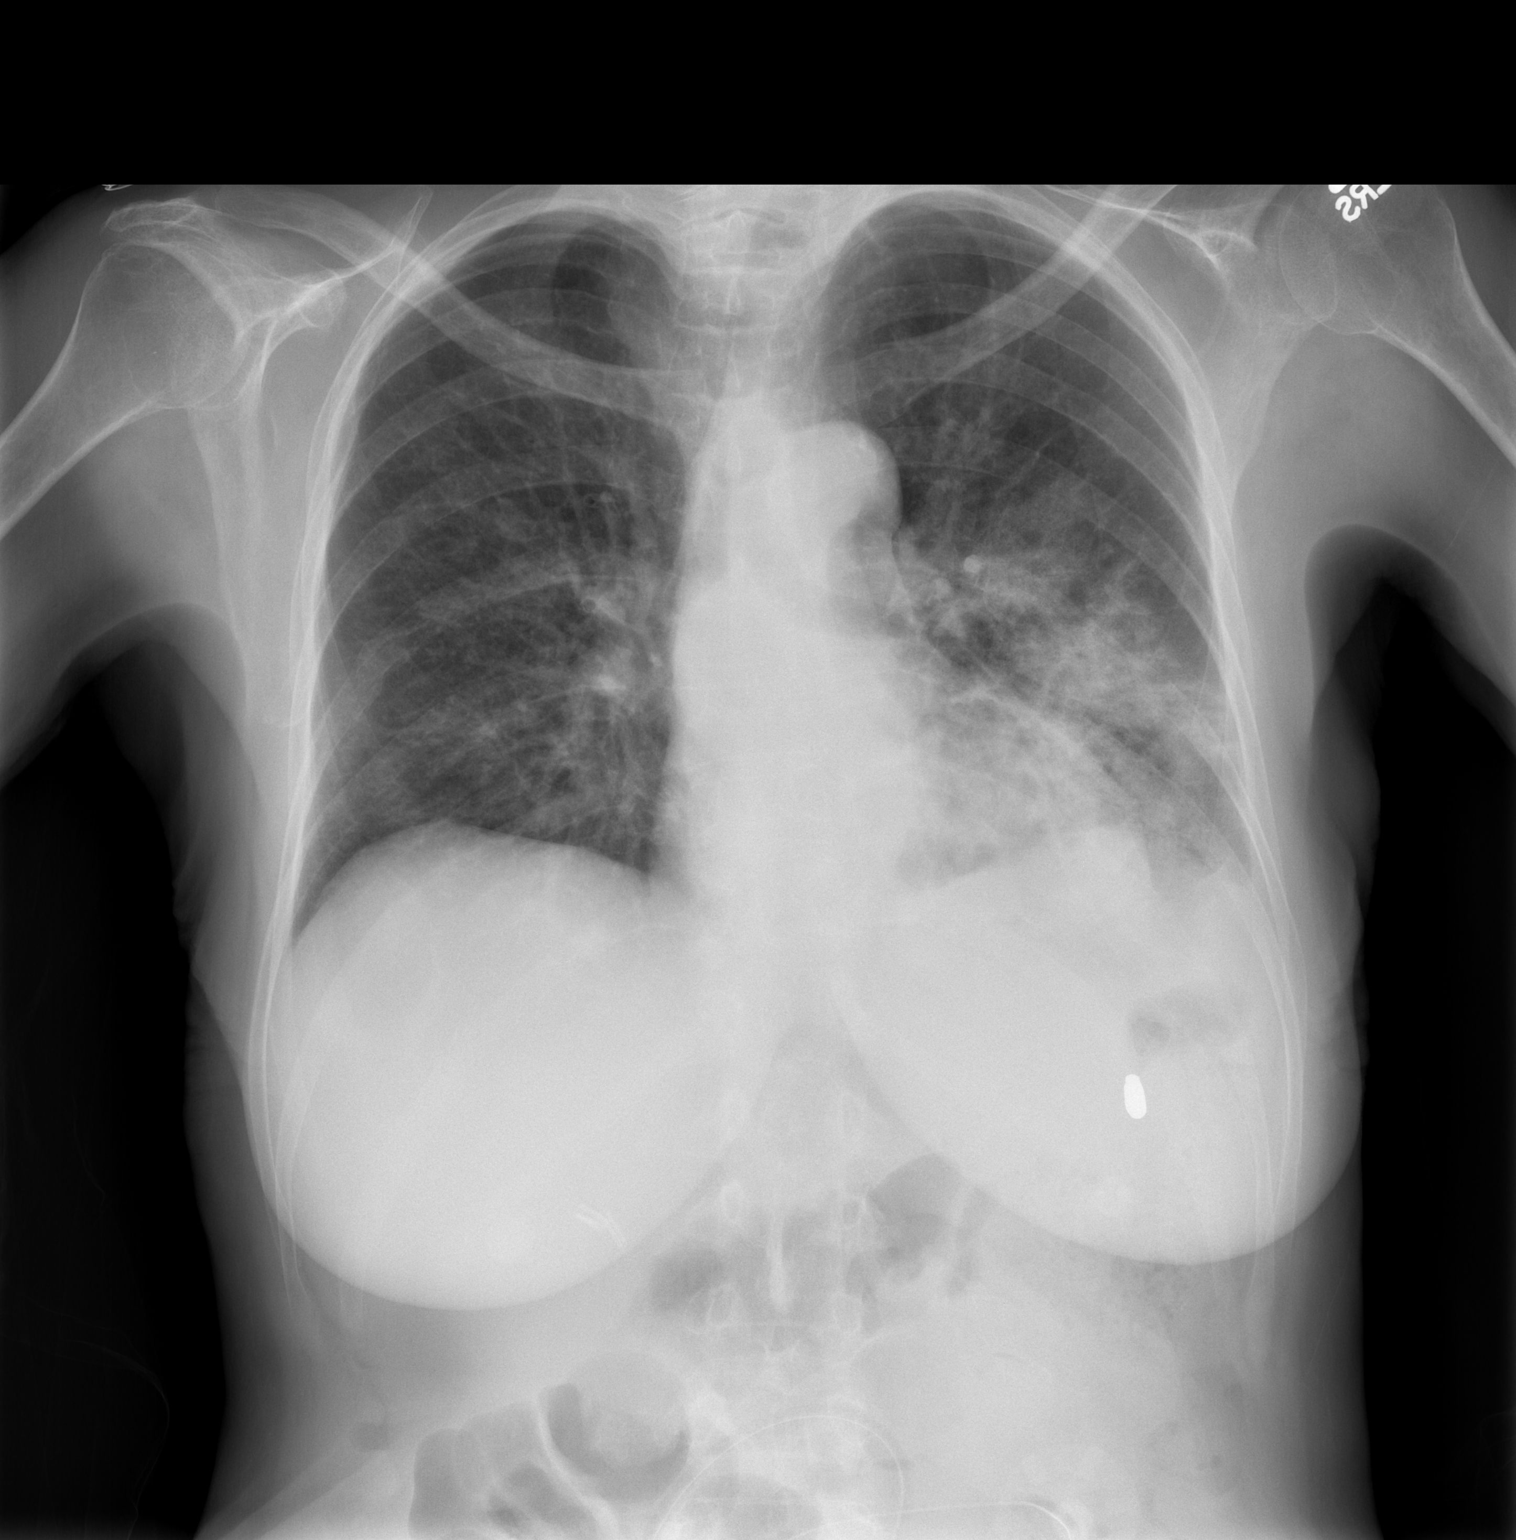

[w chest lat]
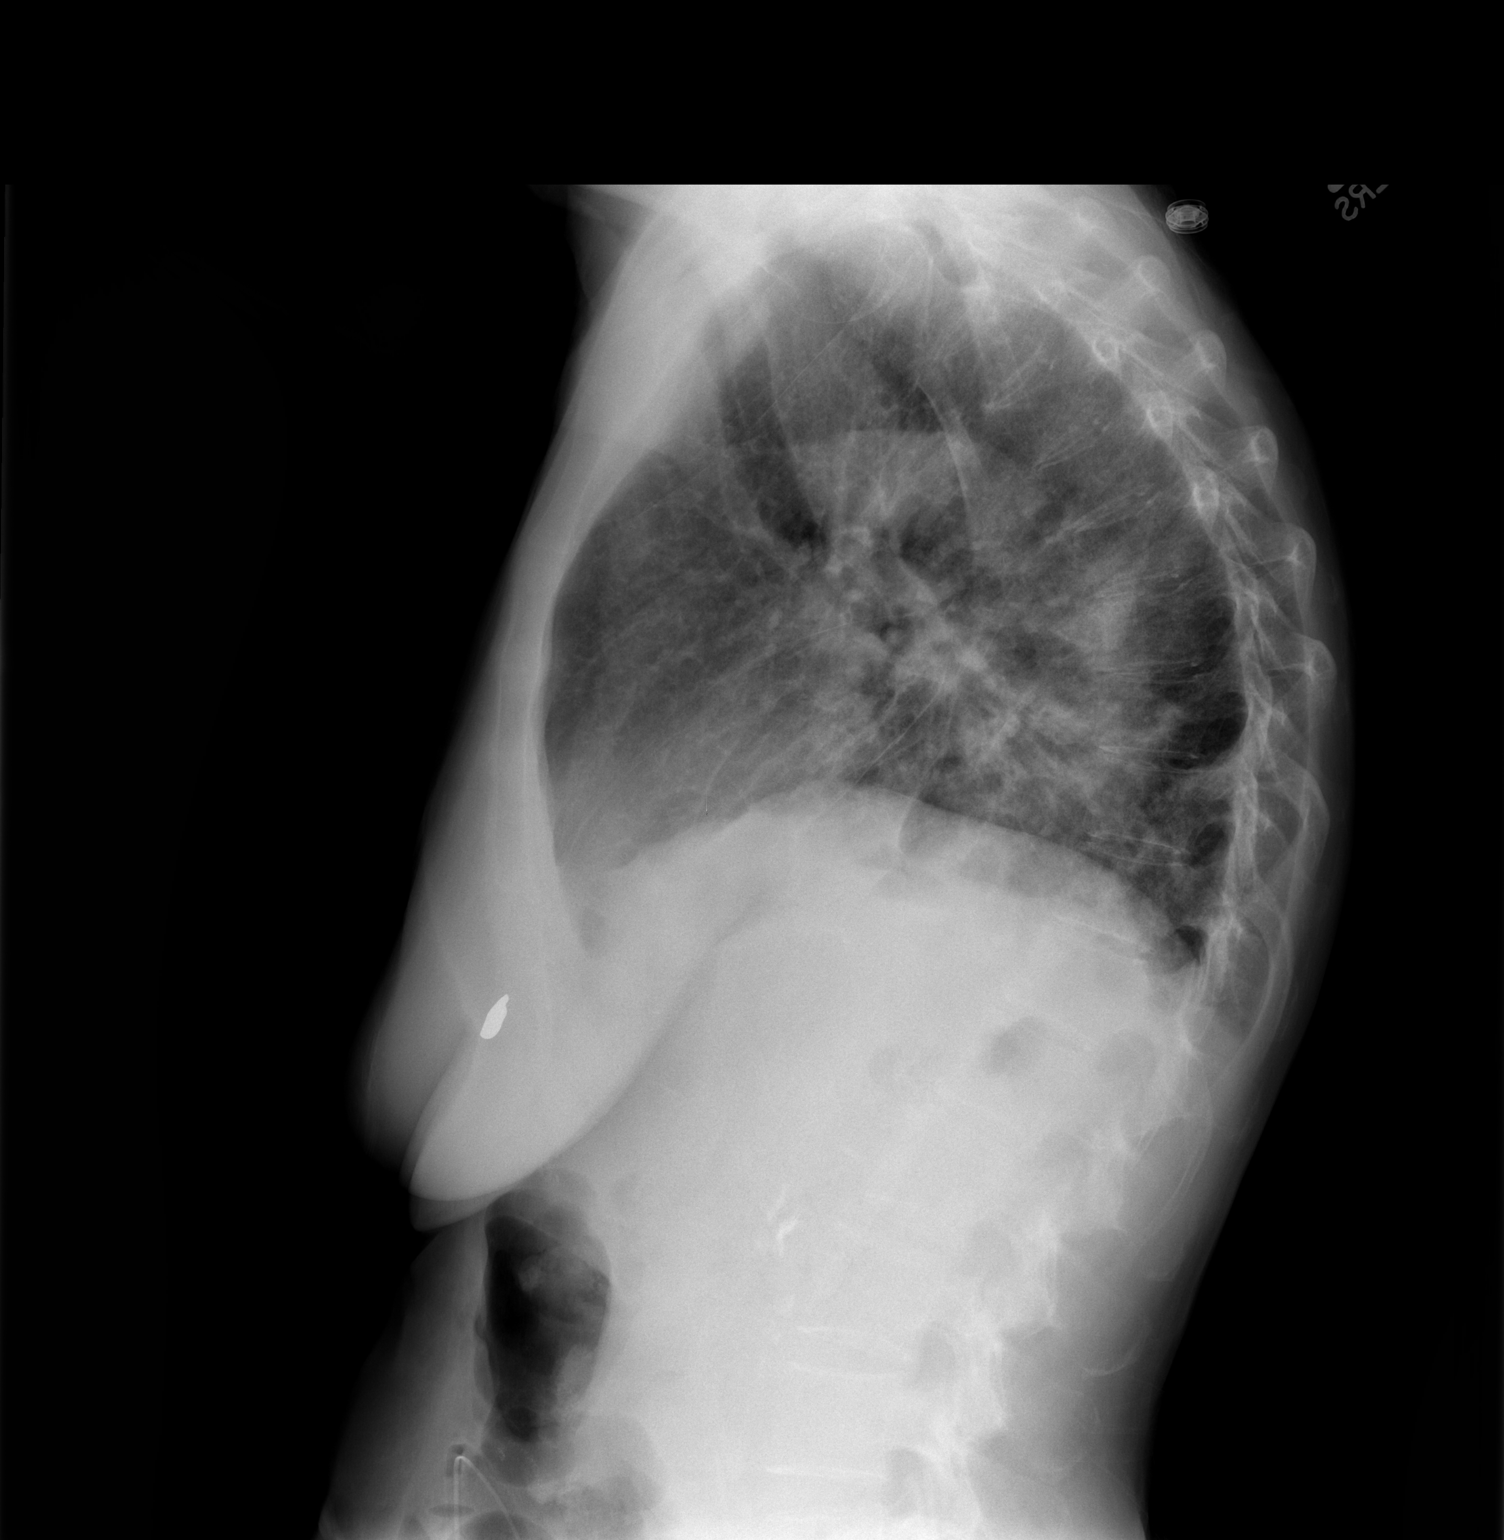

[2 of 2 positions shown; findings below may reference images not displayed]

FINDINGS: The patient has extensive left lower lobe airspace
disease consistent with pneumonia.  Right lung clear.  No
pneumothorax or effusion.  Radiopaque density in the left breast is
unchanged.
IMPRESSION: Left lower lobe pneumonia.  Recommend follow-up plain films to
clearing.

## 2012-01-19 IMAGING — CR DG CHEST 1V PORT
1 series · 1 of 1 positions shown · non-contrast
Comparison: Chest radiograph 08/25/2010

CLINICAL DATA: Tube, catheter placed

PORTABLE CHEST - 1 VIEW

[AP]
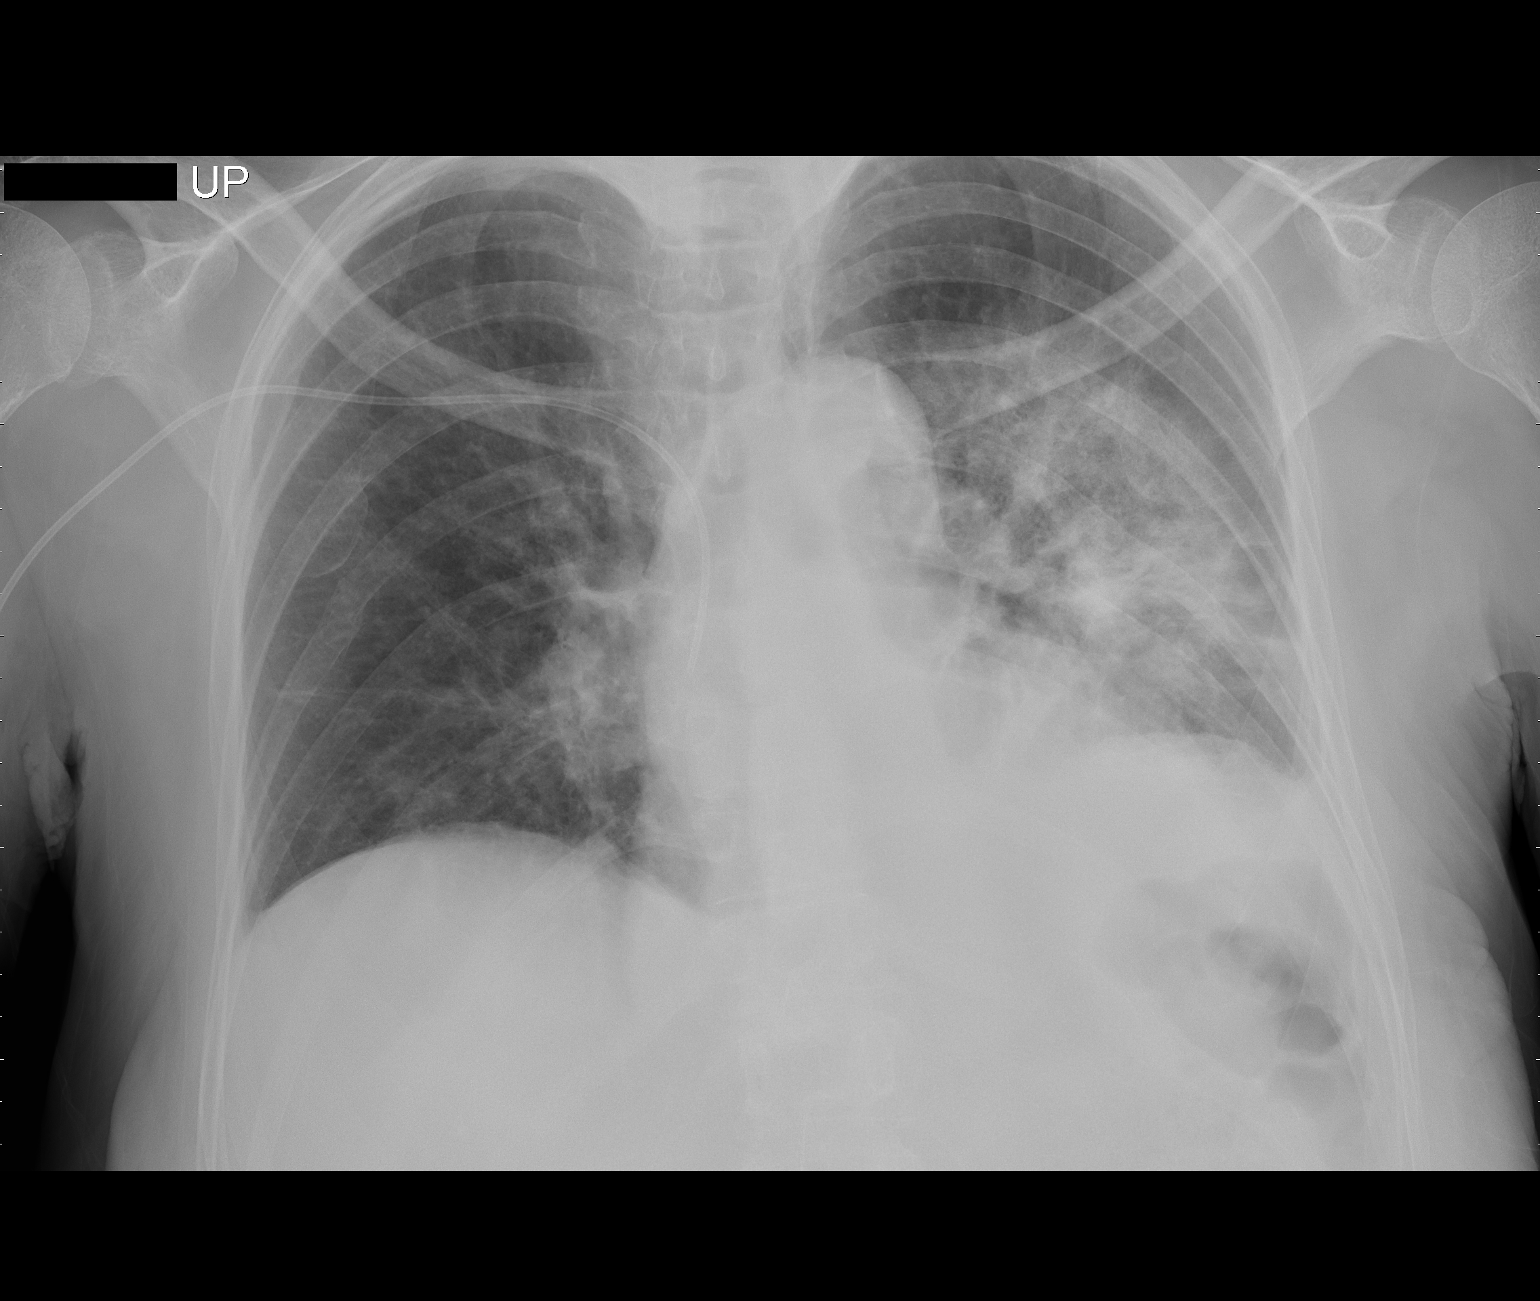

[1 of 1 positions shown; findings below may reference images not displayed]

FINDINGS: A right-sided PICC line with tip in distal SVC.  Stable
heart silhouette.  Left lower lobe pneumonia is not improved
compared to prior.  Right lung is relatively clear.  No
pneumothorax.
IMPRESSION: 1.  Interval placement of PICC line in good position.
2.  No improvement in left lower lobe pneumonia.

## 2012-01-20 IMAGING — CR DG CHEST 2V
2 series · 2 of 2 positions shown · non-contrast
Comparison: 08/29/2010

CLINICAL DATA: Pneumonia

CHEST - 2 VIEW

[w chest pa]
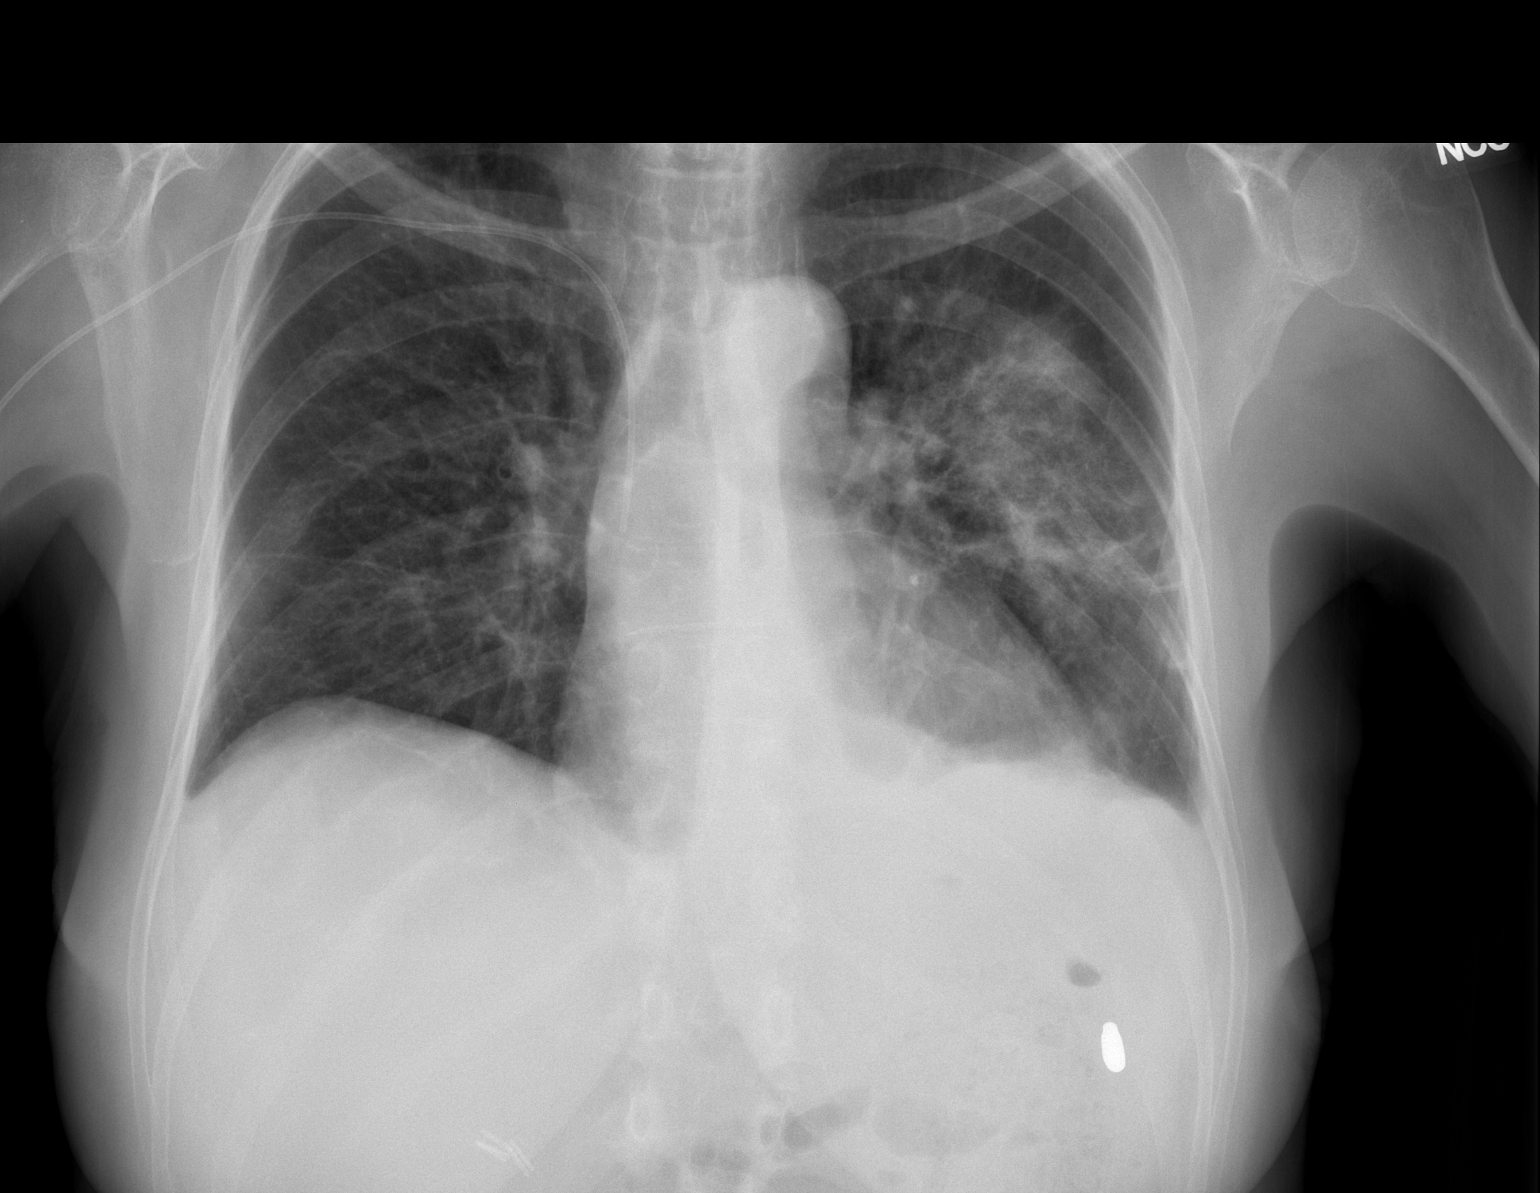

[w chest lat]
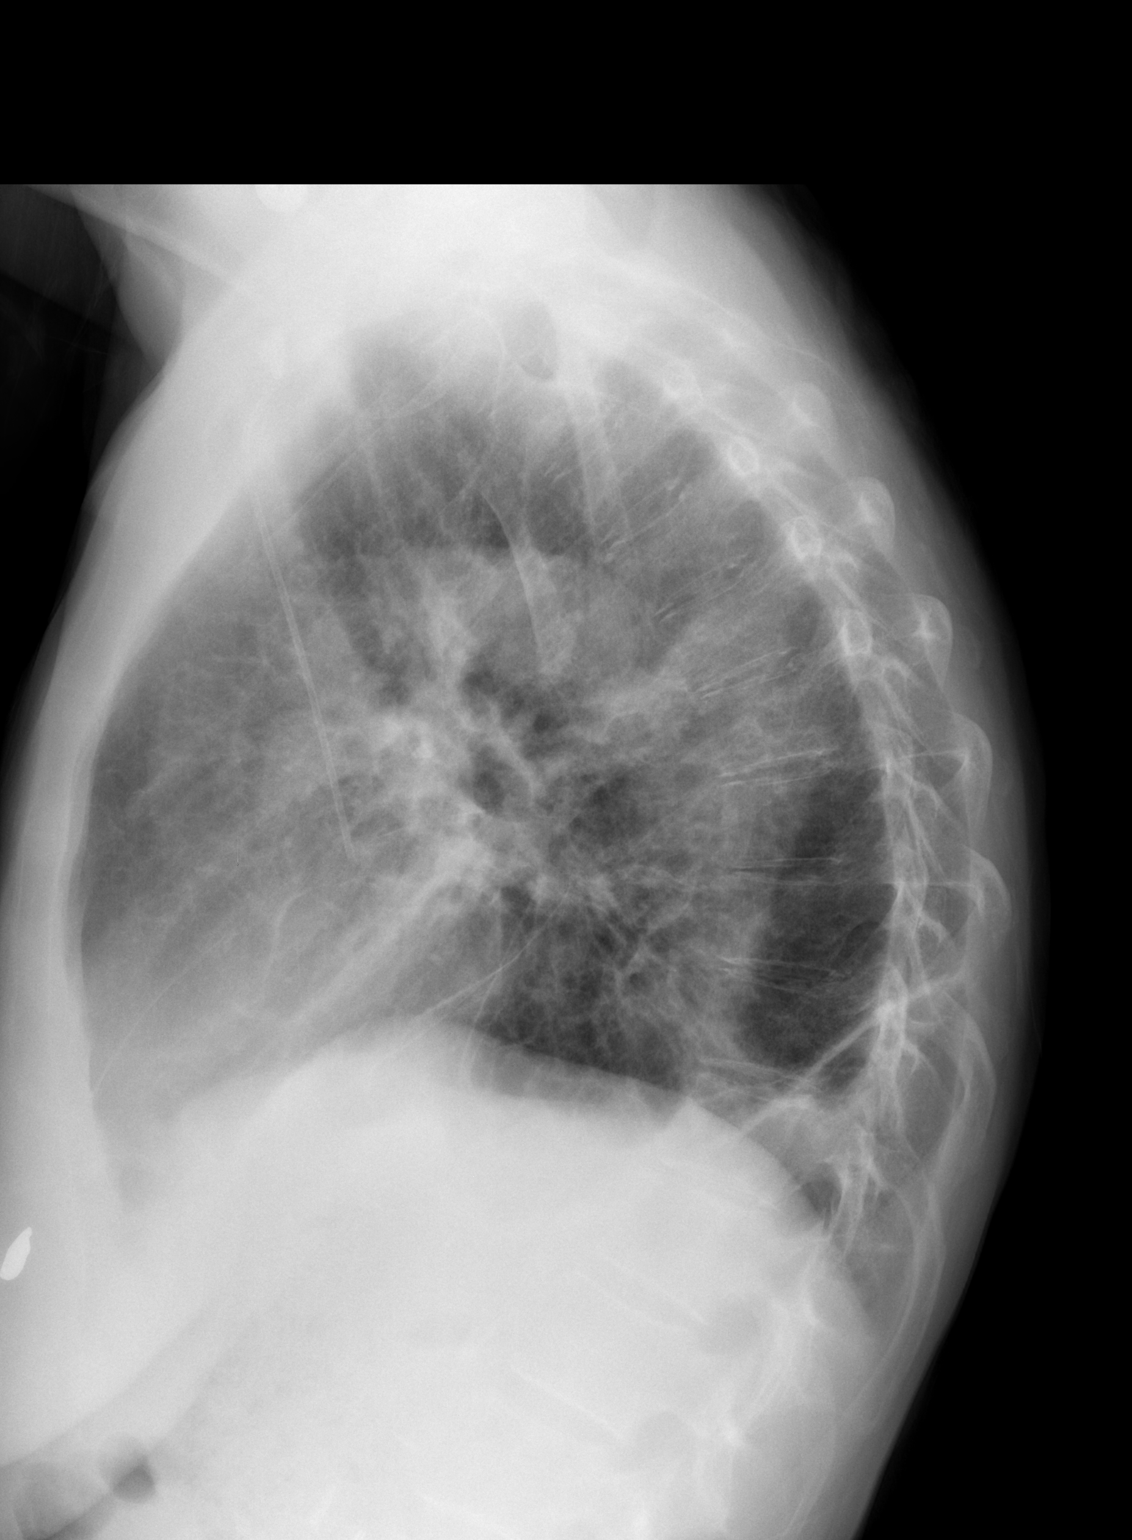

[2 of 2 positions shown; findings below may reference images not displayed]

FINDINGS: Right PICC line tip remains in the SVC.  Slight interval
improvement in left lower lobe and lingula pneumonia.  No large
effusion or pneumothorax.  Right lung remains clear.
IMPRESSION: Slight improvement in the left lower lobe/lingula pneumonia.

## 2012-02-01 ENCOUNTER — Other Ambulatory Visit: Payer: Self-pay | Admitting: Internal Medicine

## 2012-02-08 ENCOUNTER — Other Ambulatory Visit: Payer: Self-pay | Admitting: Internal Medicine

## 2012-02-08 ENCOUNTER — Telehealth: Payer: Self-pay | Admitting: Internal Medicine

## 2012-02-08 DIAGNOSIS — R633 Feeding difficulties, unspecified: Secondary | ICD-10-CM

## 2012-02-08 NOTE — Telephone Encounter (Signed)
Patient states her feeding tube is leaking. She states it continued to leak after she called back in August but she did not call back. She states she is taking her Reglan. Spoke with IR at [facility] and scheduled patient to be seen tomorrow at 11:30 AM.(Kathy). Patient also asking why her Phenergan refill was declined. Told patient the refill is not due until 02/26/12. She is asking if it can be refilled early. She states she takes it when she is sick to her stomach. Discussed correct dosage. Please, advise.

## 2012-02-08 NOTE — Telephone Encounter (Signed)
Give her only #20 Phenergan 25 mg, will have to last till 02/26/2012

## 2012-02-09 ENCOUNTER — Ambulatory Visit (HOSPITAL_COMMUNITY): Payer: Medicare Other

## 2012-02-09 ENCOUNTER — Telehealth: Payer: Self-pay | Admitting: Internal Medicine

## 2012-02-09 MED ORDER — PROMETHAZINE HCL 6.25 MG/5ML PO SYRP: 6.2500 mg | ORAL_SOLUTION | ORAL | Status: DC | PRN

## 2012-02-09 NOTE — Telephone Encounter (Signed)
Spoke with patient and gave her the phone # to IR to reschedule.

## 2012-02-09 NOTE — Telephone Encounter (Signed)
Sent rx for Phenergan suspension partial dose(160 ml). Patient aware.

## 2012-02-10 ENCOUNTER — Ambulatory Visit (HOSPITAL_COMMUNITY)
Admission: RE | Admit: 2012-02-10 | Discharge: 2012-02-10 | Disposition: A | Discharge status: Home / Self Care | Payer: Medicare Other | Source: Ambulatory Visit | Attending: Internal Medicine | Admitting: Internal Medicine

## 2012-02-10 DIAGNOSIS — R633 Feeding difficulties, unspecified: Secondary | ICD-10-CM

## 2012-02-10 DIAGNOSIS — K9413 Enterostomy malfunction: Secondary | ICD-10-CM | POA: Insufficient documentation

## 2012-02-10 DIAGNOSIS — K9403 Colostomy malfunction: Secondary | ICD-10-CM | POA: Insufficient documentation

## 2012-02-10 MED ORDER — LIDOCAINE VISCOUS 2 % MT SOLN
5.0000 mL | Freq: Once | OROMUCOSAL | Status: DC
  Filled 2012-02-10: qty 5

## 2012-02-10 MED ORDER — IOHEXOL 300 MG/ML  SOLN: 15.0000 mL | Freq: Once | INTRAMUSCULAR | Status: AC | PRN

## 2012-02-10 NOTE — Procedures (Signed)
Successful exchg and upsize to a 48fr J tube No comp Stable Ready for use Full report in PACS

## 2012-02-11 ENCOUNTER — Other Ambulatory Visit (HOSPITAL_COMMUNITY): Payer: Self-pay | Admitting: Interventional Radiology

## 2012-02-11 DIAGNOSIS — K3184 Gastroparesis: Secondary | ICD-10-CM

## 2012-02-15 ENCOUNTER — Ambulatory Visit (HOSPITAL_COMMUNITY)
Admission: RE | Admit: 2012-02-15 | Discharge: 2012-02-15 | Disposition: A | Discharge status: Home / Self Care | Payer: Medicare Other | Source: Ambulatory Visit | Attending: Interventional Radiology | Admitting: Interventional Radiology

## 2012-02-15 DIAGNOSIS — Z79899 Other long term (current) drug therapy: Secondary | ICD-10-CM | POA: Insufficient documentation

## 2012-02-15 DIAGNOSIS — K219 Gastro-esophageal reflux disease without esophagitis: Secondary | ICD-10-CM | POA: Insufficient documentation

## 2012-02-15 DIAGNOSIS — G4730 Sleep apnea, unspecified: Secondary | ICD-10-CM | POA: Insufficient documentation

## 2012-02-15 DIAGNOSIS — I10 Essential (primary) hypertension: Secondary | ICD-10-CM | POA: Insufficient documentation

## 2012-02-15 DIAGNOSIS — K9413 Enterostomy malfunction: Secondary | ICD-10-CM | POA: Insufficient documentation

## 2012-02-15 DIAGNOSIS — E119 Type 2 diabetes mellitus without complications: Secondary | ICD-10-CM | POA: Insufficient documentation

## 2012-02-15 DIAGNOSIS — K9403 Colostomy malfunction: Secondary | ICD-10-CM | POA: Insufficient documentation

## 2012-02-15 DIAGNOSIS — K3184 Gastroparesis: Secondary | ICD-10-CM | POA: Insufficient documentation

## 2012-02-15 LAB — GLUCOSE, CAPILLARY: Glucose-Capillary: 137 mg/dL — ABNORMAL HIGH (ref 70–99)

## 2012-02-15 MED ORDER — MIDAZOLAM HCL 2 MG/2ML IJ SOLN
INTRAMUSCULAR | Status: AC
  Filled 2012-02-15: qty 4

## 2012-02-15 MED ORDER — METOCLOPRAMIDE HCL 5 MG/ML IJ SOLN: 10.0000 mg | Freq: Once | INTRAMUSCULAR | Status: DC

## 2012-02-15 MED ORDER — FENTANYL CITRATE 0.05 MG/ML IJ SOLN
INTRAMUSCULAR | Status: AC
  Filled 2012-02-15: qty 4

## 2012-02-15 MED ORDER — IOHEXOL 300 MG/ML  SOLN
35.0000 mL | Freq: Once | INTRAMUSCULAR | Status: AC | PRN
  Administered 2012-02-15: 35 mL

## 2012-02-15 NOTE — Procedures (Signed)
Successful fluoroscopic guided replacement, upsize and lengthening of 20 Fr jenostomy tube.  No immediate post procedural complications.  The feeding tube is ready for immediate use.
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
## 2012-02-16 ENCOUNTER — Telehealth (INDEPENDENT_AMBULATORY_CARE_PROVIDER_SITE_OTHER): Payer: Self-pay | Admitting: General Surgery

## 2012-02-16 ENCOUNTER — Telehealth (HOSPITAL_COMMUNITY): Payer: Self-pay | Admitting: *Deleted

## 2012-02-16 NOTE — Telephone Encounter (Addendum)
Patient called c/o of continued leakage around  j tube placed yesterday.  Discussed with Dr Grace Isaac, he suggests slowing down feeds and making portions smaller and more frequent.  At this time there does not appear to be anything else we can offer this patient.  I called to talk to patient.  She states that the only thing that leaks from her tube is what she consumes by mouth.  I explained that gastroparesis causes delay or inadequate emptying of her stomach and that she should use the J tube primary.  She stated that she does use the j tube for all her feedings but that she does drink liquids and occasional snacks by mouth.  I explained that we would be unable to help her stomach to empty more efficiently.  The j tube placed on 02-15-2012 is in a good position and she should have no problems if using the tube.  She mentioned calling her surgeon that originally placed it.

## 2012-02-16 NOTE — Telephone Encounter (Signed)
Emily [name] called to say that she had her second feeding tube replacement done yesterday with a larger tube/ #20. It was changed due to leaking around insertion site/ ordered by dr. Regino Schultze office/ per pt this has been an issue or several months/ clear liquid. She was told by radiology to notify her surgeon of this problem. Does she need an appointment? Or other suggestion/ thanks/gy/

## 2012-02-17 ENCOUNTER — Telehealth (INDEPENDENT_AMBULATORY_CARE_PROVIDER_SITE_OTHER): Payer: Self-pay

## 2012-02-17 ENCOUNTER — Other Ambulatory Visit: Payer: Self-pay | Admitting: Internal Medicine

## 2012-02-17 NOTE — Telephone Encounter (Signed)
Pt called stating she has not heard back from Dr Abbey Chatters re:her tube leaking. I advised her that I was on phones yesterday and reassured her that Cyndra Numbers did receive the msg from [place]. Pt advised I will send the request to Dr Abbey Chatters and Cyndra Numbers again for review. Pt states she was told by radiology that they have replaced her tube with  largest tube they have. Pt advised if she has not heard from [facility] to please call her in the morning to see if Dr Abbey Chatters has made any recommendations.

## 2012-02-18 NOTE — Telephone Encounter (Signed)
Spoke with the patient this morning after talking with Dr. Abbey Chatters.  He is not sure if anything can be done about her leaking j tube.  She did tell me that she had been drinking and eating po.  I told her she should take all nourishment through her tube, and hopefully that would stop the leaking.  I am not sure that she is doing this.  I told her I would talk with Dr. Juanda Chance or his nurse to see if they had any other suggestions.  I will call her after I speak with their office.

## 2012-02-19 ENCOUNTER — Telehealth: Payer: Self-pay | Admitting: *Deleted

## 2012-02-19 ENCOUNTER — Telehealth (INDEPENDENT_AMBULATORY_CARE_PROVIDER_SITE_OTHER): Payer: Self-pay

## 2012-02-19 NOTE — Telephone Encounter (Signed)
Spoke with Cyndra Numbers Dr. Maris Berger nurse. She states the patient was told by IR to call them to discuss her leaking J tube. IR has changed the tube several times recently. Cyndra Numbers talked with patient and she states the patient told her the tube leaks when she eats. Patient stated she was drinking beer and eating pickles. Cyndra Numbers said she instructed the patient to do feedings and stop eating if that is what is causing the tube to leak. Cyndra Numbers just wanted to let Dr. Juanda Chance know about this.

## 2012-02-19 NOTE — Telephone Encounter (Signed)
Spoke with Dr. Regino Schultze office today about the patient's chronic problem of j tube leaking.  We discussed her stopping anything by mouth.  I scheduled her to come see Dr. Abbey Chatters to discuss.

## 2012-02-24 ENCOUNTER — Other Ambulatory Visit: Payer: Self-pay | Admitting: Physician Assistant

## 2012-02-25 ENCOUNTER — Telehealth: Payer: Self-pay | Admitting: Internal Medicine

## 2012-02-25 NOTE — Telephone Encounter (Signed)
DB- Patient was given #20 Zofran on 06/19/11 at her office visit with Amy Esterwood due to iritractable nausea and vomiting. Patient now wants refills on Zofran again. Do you want her to continue Zofran, Reglan AND phenergan?

## 2012-02-26 NOTE — Telephone Encounter (Signed)
OK to renew Zofran 4 mg, #20, 1 po q8 hrs prn, 1 refill, Phenergan 25 mg, #30, 1 po q4-6 hrs prn nausea, 1 refill, Reglan 10 mg #90, 1 po tid, 2 refills

## 2012-02-29 ENCOUNTER — Ambulatory Visit (INDEPENDENT_AMBULATORY_CARE_PROVIDER_SITE_OTHER): Payer: Medicare Other | Admitting: General Surgery

## 2012-02-29 ENCOUNTER — Encounter (INDEPENDENT_AMBULATORY_CARE_PROVIDER_SITE_OTHER): Payer: Self-pay | Admitting: General Surgery

## 2012-02-29 ENCOUNTER — Telehealth: Payer: Self-pay | Admitting: Internal Medicine

## 2012-02-29 VITALS — BP 138/86 | HR 113 | Temp 97.3°F | Resp 18 | Ht 64.0 in | Wt 162.6 lb

## 2012-02-29 DIAGNOSIS — K9403 Colostomy malfunction: Secondary | ICD-10-CM

## 2012-02-29 DIAGNOSIS — K9413 Enterostomy malfunction: Secondary | ICD-10-CM

## 2012-02-29 MED ORDER — ONDANSETRON 4 MG PO TBDP: 4.0000 mg | ORAL_TABLET | Freq: Three times a day (TID) | ORAL | Status: DC | PRN

## 2012-02-29 MED ORDER — METOCLOPRAMIDE HCL 5 MG/5ML PO SOLN: 5.0000 mg | Freq: Three times a day (TID) | ORAL | Status: DC

## 2012-02-29 MED ORDER — PROMETHAZINE HCL 6.25 MG/5ML PO SYRP: ORAL_SOLUTION | ORAL | Status: DC

## 2012-02-29 NOTE — Telephone Encounter (Signed)
rx was sent this morning 

## 2012-02-29 NOTE — Telephone Encounter (Signed)
Refilled Zofran 4 mg, Phenergan 6.25 mg liquid and Reglan 5 mg liquid (as per Dr Regino Schultze verbal orders).
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
## 2012-02-29 NOTE — Patient Instructions (Signed)
Do not taking anything by mouth except for your medicines for 6 to 8 weeks.  You may have sugar-free popsicles or candy.  Try non drowsy Dramamine for your nausea.

## 2012-03-01 IMAGING — XA IR REPLC DUODEN/JEJUNO TUBE PERCUT W/FLUORO
3 series · 5 of 5 positions shown · non-contrast
Comparison: 08/07/2010

CLINICAL DATA: Chronic indwelling balloon retention jejunostomy
tube.  This was most recently replaced on 08/07/2010.  The tube has
been noted to be leaking near the cutaneous entry site.

REPLACEMENT OF JEJUNOSTOMY FEEDING CATHETER UNDER FLUOROSCOPY

[Series 2: single · 1 of 1 slices shown]
[im 1/1]
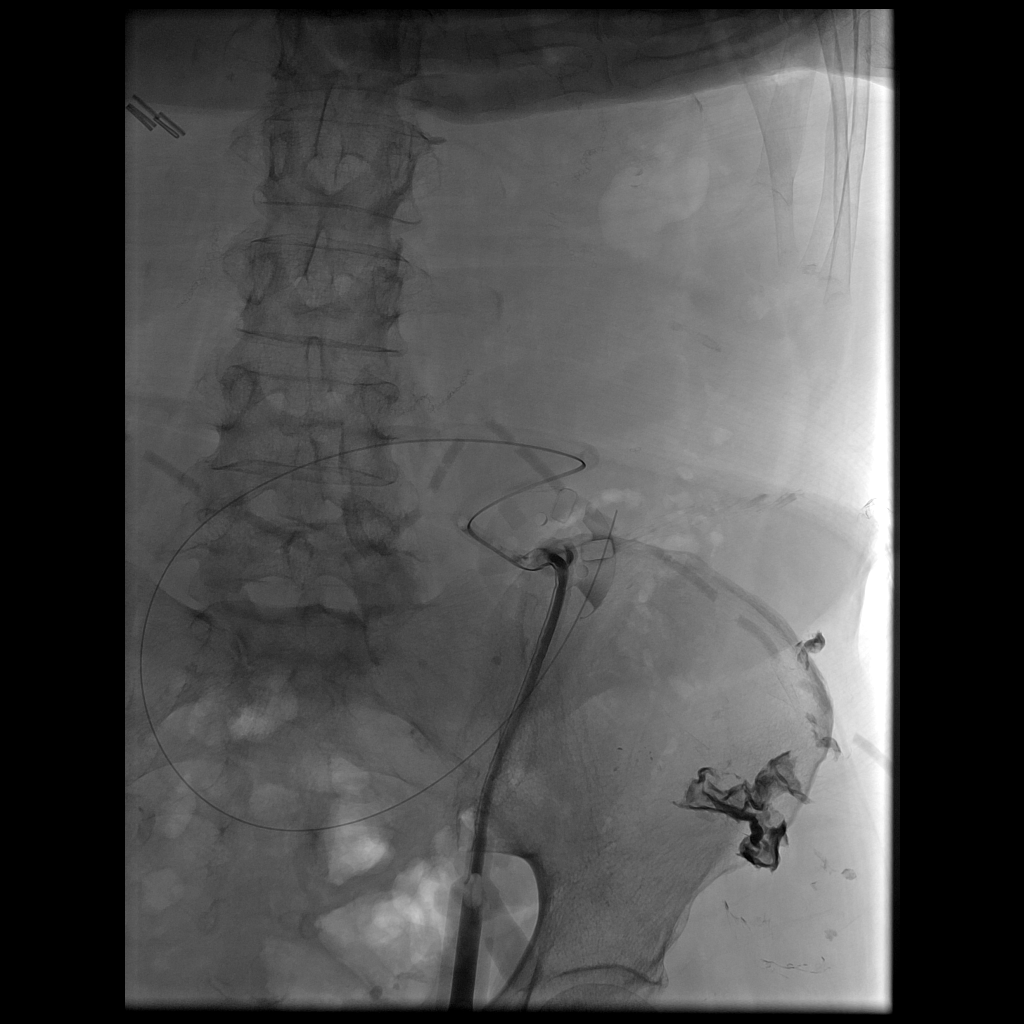

[Series 100: sp replc duoden/jejuno tube percut w/flu · 1 of 1 slices shown (1 of 2)]
[im 1/1]
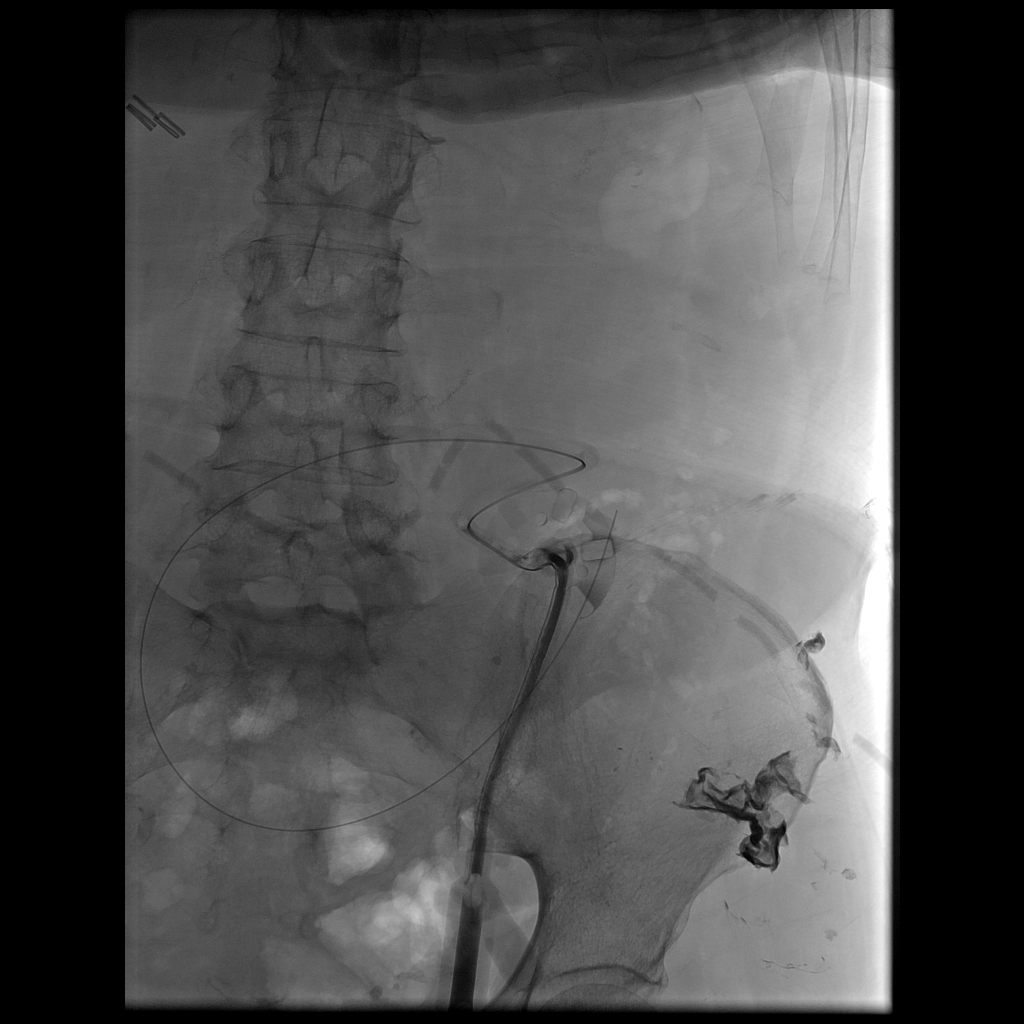

[Series 300: sp replc duoden/jejuno tube percut w/flu · 3 of 3 slices shown (2 of 2)]
[im 1/3]
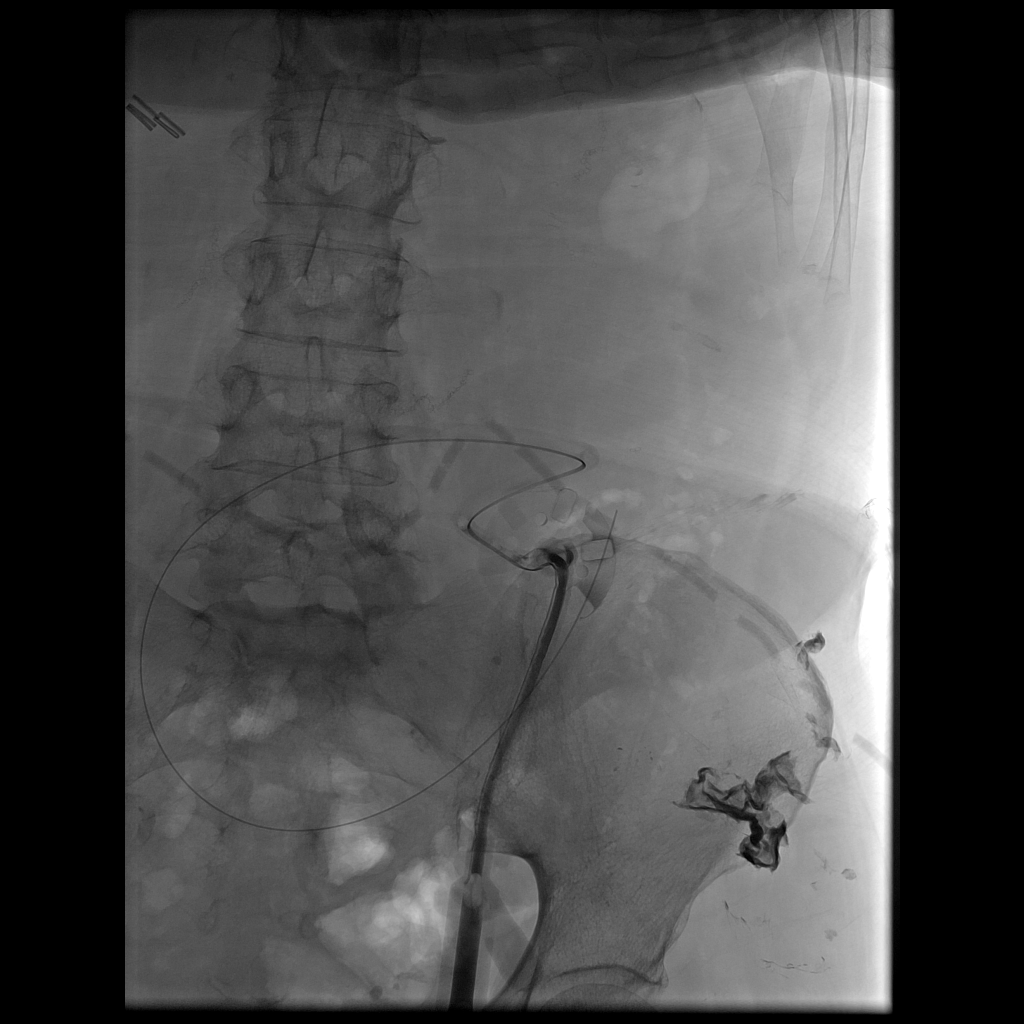
[im 2/3]
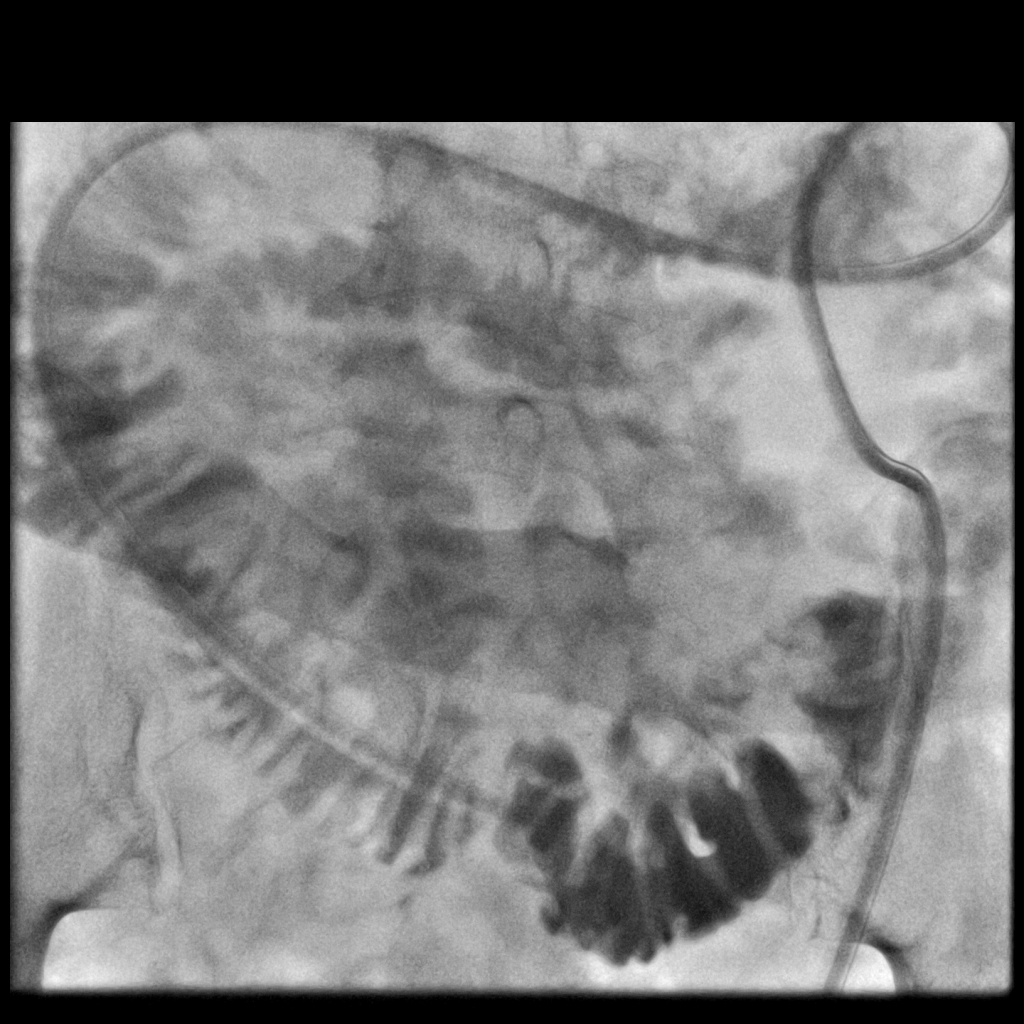
[im 3/3]
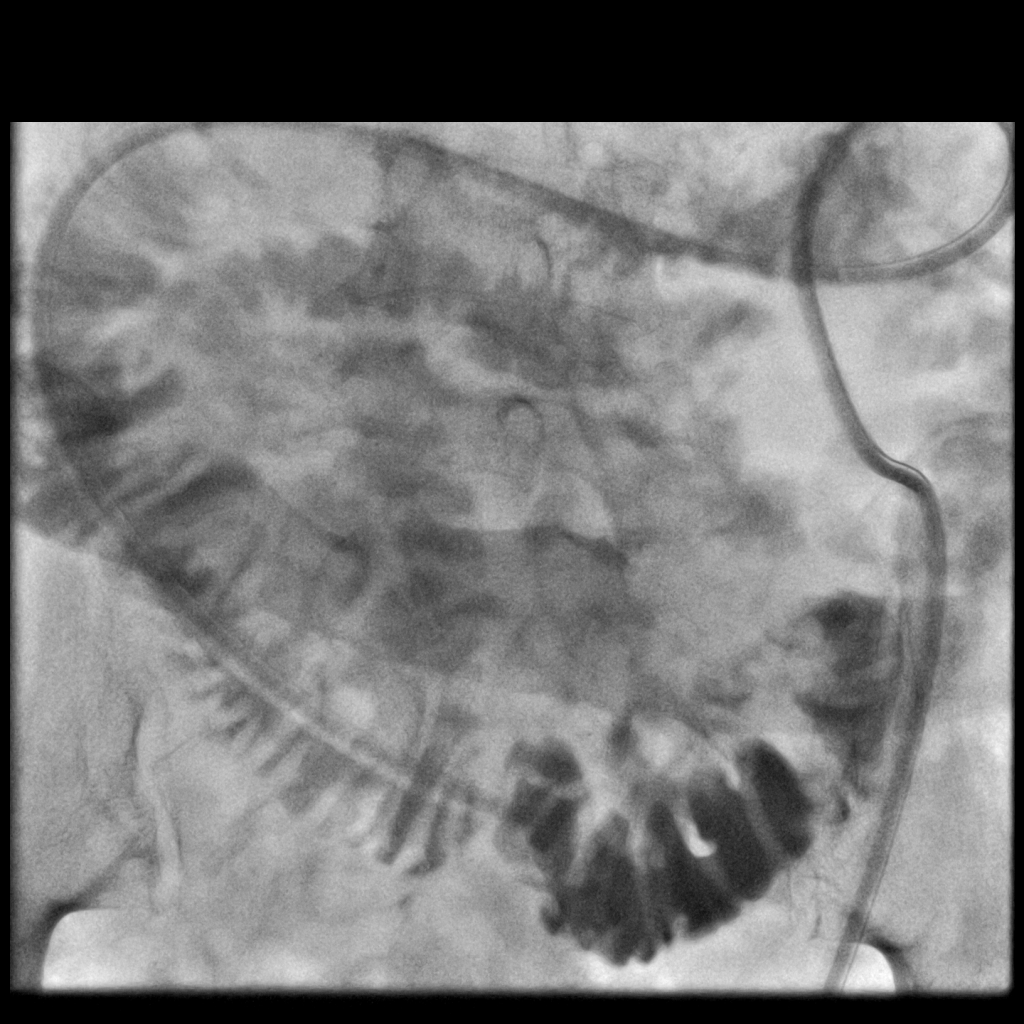

[5 of 5 positions shown; findings below may reference images not displayed]

Preexisting jejunostomy catheter was inspected.  Contrast injection
was performed under fluoroscopy.  The tube was then prepped with
Betadine.  Retention balloon was deflated. The tube was retracted
in stages and cut.  Ultimately, the tube was completely removed.  A
5-French catheter was then advanced into the percutaneous tract.

The catheter was further advanced into the small bowel over a
hydrophilic guide wire.  A new 16-French balloon retention
jejunostomy tube was advanced over a wire.  The catheter position
was confirmed under fluoroscopy after contrast injection.  The
retention balloon was inflated with 6 ml of saline.
FINDINGS: Initial contrast injection shows occlusion of the tube
and leakage of contrast material around the entry site.  The tube
likely has ruptured due to forceful injection from attempted
flushing of an occluded catheter.

After withdrawing the catheter in stages, it was evident that the
entire tube was essentially obstructed by concretion of tube
feeding material and crushed pill fragments.  Access through the
jejunum was reaaccomplished with a catheter allowing placement of a
new jejunostomy catheter.  The patient was instructed to make sure
that adequate flushing is being performed after use for tube
feedings and with any administration of crushed pills.  She was
also told to try to crush the pills as fine as possible and use
plenty of saline or water for flushing the catheter.
IMPRESSION: Replacement of occluded and ruptured 16-French balloon retention
jejunostomy tube.  As above, the entire tube was completely
occluded by concretion of tube feed and crushed pill material.  The
patient was instructed to increase flushing of the catheter after
any use.

## 2012-03-15 ENCOUNTER — Other Ambulatory Visit: Payer: Self-pay | Admitting: *Deleted

## 2012-03-15 MED ORDER — LORAZEPAM 0.5 MG PO TABS: ORAL_TABLET | ORAL | Status: DC

## 2012-03-15 NOTE — Telephone Encounter (Signed)
Rx sent for 1 additonal month. Per Dr Juanda Chance, patient needs return visit for routine follow up before any further refills are to be given.

## 2012-04-13 ENCOUNTER — Ambulatory Visit (INDEPENDENT_AMBULATORY_CARE_PROVIDER_SITE_OTHER): Payer: Medicare Other | Admitting: Physician Assistant

## 2012-04-13 ENCOUNTER — Telehealth: Payer: Self-pay | Admitting: Internal Medicine

## 2012-04-13 ENCOUNTER — Other Ambulatory Visit (INDEPENDENT_AMBULATORY_CARE_PROVIDER_SITE_OTHER): Payer: Medicare Other

## 2012-04-13 ENCOUNTER — Encounter: Payer: Self-pay | Admitting: Physician Assistant

## 2012-04-13 ENCOUNTER — Other Ambulatory Visit: Payer: Self-pay | Admitting: *Deleted

## 2012-04-13 ENCOUNTER — Ambulatory Visit (INDEPENDENT_AMBULATORY_CARE_PROVIDER_SITE_OTHER)
Admission: RE | Admit: 2012-04-13 | Discharge: 2012-04-13 | Disposition: A | Discharge status: Home / Self Care | Payer: Medicare Other | Source: Ambulatory Visit | Attending: Physician Assistant | Admitting: Physician Assistant

## 2012-04-13 VITALS — BP 108/70 | HR 100 | Ht 64.0 in | Wt 166.0 lb

## 2012-04-13 DIAGNOSIS — E876 Hypokalemia: Secondary | ICD-10-CM

## 2012-04-13 DIAGNOSIS — R110 Nausea: Secondary | ICD-10-CM

## 2012-04-13 DIAGNOSIS — R52 Pain, unspecified: Secondary | ICD-10-CM

## 2012-04-13 DIAGNOSIS — K219 Gastro-esophageal reflux disease without esophagitis: Secondary | ICD-10-CM

## 2012-04-13 DIAGNOSIS — R1084 Generalized abdominal pain: Secondary | ICD-10-CM

## 2012-04-13 DIAGNOSIS — R109 Unspecified abdominal pain: Secondary | ICD-10-CM

## 2012-04-13 DIAGNOSIS — K5900 Constipation, unspecified: Secondary | ICD-10-CM

## 2012-04-13 LAB — CBC WITH DIFFERENTIAL/PLATELET
Basophils Absolute: 0 K/uL (ref 0.0–0.1)
Basophils Relative: 0.1 % (ref 0.0–3.0)
Eosinophils Absolute: 0.1 K/uL (ref 0.0–0.7)
Eosinophils Relative: 0.5 % (ref 0.0–5.0)
HCT: 42 % (ref 36.0–46.0)
Hemoglobin: 13.2 g/dL (ref 12.0–15.0)
Lymphocytes Relative: 11.7 % — ABNORMAL LOW (ref 12.0–46.0)
Lymphs Abs: 1.4 K/uL (ref 0.7–4.0)
MCHC: 31.4 g/dL (ref 30.0–36.0)
MCV: 81.7 fl (ref 78.0–100.0)
Monocytes Absolute: 0.4 K/uL (ref 0.1–1.0)
Monocytes Relative: 3.4 % (ref 3.0–12.0)
Neutro Abs: 9.9 K/uL — ABNORMAL HIGH (ref 1.4–7.7)
Neutrophils Relative %: 84.3 % — ABNORMAL HIGH (ref 43.0–77.0)
Platelets: 225 K/uL (ref 150.0–400.0)
RBC: 5.15 Mil/uL — ABNORMAL HIGH (ref 3.87–5.11)
RDW: 12.4 % (ref 11.5–14.6)
WBC: 11.7 K/uL — ABNORMAL HIGH (ref 4.5–10.5)

## 2012-04-13 LAB — BASIC METABOLIC PANEL WITH GFR
BUN: 33 mg/dL — ABNORMAL HIGH (ref 6–23)
CO2: 25 meq/L (ref 19–32)
Calcium: 9.3 mg/dL (ref 8.4–10.5)
Chloride: 94 meq/L — ABNORMAL LOW (ref 96–112)
Creatinine, Ser: 1 mg/dL (ref 0.4–1.2)
GFR: 70.95 mL/min
Glucose, Bld: 168 mg/dL — ABNORMAL HIGH (ref 70–99)
Potassium: 3.2 meq/L — ABNORMAL LOW (ref 3.5–5.1)
Sodium: 134 meq/L — ABNORMAL LOW (ref 135–145)

## 2012-04-13 LAB — POTASSIUM: Potassium: 3.2 meq/L — ABNORMAL LOW (ref 3.5–5.1)

## 2012-04-13 MED ORDER — METOCLOPRAMIDE HCL 5 MG/5ML PO SOLN: 5.0000 mg | Freq: Three times a day (TID) | ORAL | Status: DC

## 2012-04-13 MED ORDER — HYDROCODONE-ACETAMINOPHEN 5-500 MG PO TABS: 1.0000 | ORAL_TABLET | Freq: Four times a day (QID) | ORAL | Status: DC | PRN

## 2012-04-13 MED ORDER — PROMETHAZINE HCL 6.25 MG/5ML PO SYRP: 6.2500 mg | ORAL_SOLUTION | ORAL | Status: DC | PRN

## 2012-04-13 MED ORDER — SUCRALFATE 1 GM/10ML PO SUSP: 1.0000 g | Freq: Three times a day (TID) | ORAL | Status: DC

## 2012-04-13 MED ORDER — ONDANSETRON 4 MG PO TBDP: 4.0000 mg | ORAL_TABLET | Freq: Three times a day (TID) | ORAL | Status: DC | PRN

## 2012-04-13 NOTE — Telephone Encounter (Signed)
Patient states she has been sick since before Thanksgiving. States her tube feeding stays down but she is feeling like her pills are going to come back up. States her stomach is swollen and she is having muscle spasms in her stomach. Scheduled patient with Mike Gip, PA today at 3:00 PM.

## 2012-04-13 NOTE — Patient Instructions (Addendum)
Please go to the basement level to have your labs drawn.  We sent refills for Carafate, Reglan, Phenergan, and Zofran.  We have given you samples of Miralax. Take 2 to 3 doses each day for the next 4 days then 1 dose daily.

## 2012-04-14 LAB — ETHANOL: Alcohol, Ethyl (B): 30 mg/dL — ABNORMAL HIGH (ref 0–10)

## 2012-04-15 ENCOUNTER — Other Ambulatory Visit: Payer: Self-pay | Admitting: *Deleted

## 2012-04-15 MED ORDER — POTASSIUM CHLORIDE 20 MEQ/15ML (10%) PO LIQD: ORAL | Status: DC

## 2012-04-24 ENCOUNTER — Other Ambulatory Visit: Payer: Self-pay | Admitting: Physician Assistant

## 2012-04-25 ENCOUNTER — Telehealth: Payer: Self-pay | Admitting: *Deleted

## 2012-04-25 ENCOUNTER — Other Ambulatory Visit: Payer: Self-pay | Admitting: *Deleted

## 2012-04-25 ENCOUNTER — Telehealth: Payer: Self-pay | Admitting: Internal Medicine

## 2012-04-25 MED ORDER — LORAZEPAM 0.5 MG PO TABS: ORAL_TABLET | ORAL | Status: DC

## 2012-04-25 MED ORDER — PROMETHAZINE HCL 6.25 MG/5ML PO SYRP: 6.2500 mg | ORAL_SOLUTION | ORAL | Status: DC | PRN

## 2012-04-25 NOTE — Telephone Encounter (Signed)
Verbally gave order for [facility] to have 240 cc of Promethazine 6.25 suspension. She cant take 5 cc every 4 hours as needed for nausea and vomiting.. 240 mL with 1 refill.  Prescriber Amy Esterwood PA-C.

## 2012-04-25 NOTE — Telephone Encounter (Signed)
Her pharmacy told her they have not received anything from use for her medications. Spoke with pharmacist and clarified rx's for patient. Ativan rx faxed. Patient aware. Scheduled her with MD on 05/13/12 at 2:30 PM.

## 2012-04-25 NOTE — Telephone Encounter (Signed)
Spoke with patient and she states the Ondansetron is not helping her nausea, vomiting. Patient states she has tried to get her Phenergan and Ativan refilled but has not gotten it. Told patient Elita Quick has just talked with her pharmacy about a refill for Phenergan. Patient should have refills left on other medications (reglan, carafate) She will have her pharmacy fax a request for Ativan. She states Phenergan helps her nausea the best. She will use this when she gets her refill.

## 2012-04-26 ENCOUNTER — Encounter: Payer: Self-pay | Admitting: Physician Assistant

## 2012-04-26 ENCOUNTER — Telehealth: Payer: Self-pay | Admitting: Internal Medicine

## 2012-04-26 NOTE — Telephone Encounter (Signed)
Spoke patient and pharmacy and all rx 's are ready at pharmacy.

## 2012-04-26 NOTE — Progress Notes (Signed)
Subjective:    Patient ID: Emily [name], female    DOB: 1940/05/19, 71 y.o.   MRN: [id number]  HPI Mariangela is a pleasant 71 year old African [name] known to Dr. [name] with a complicated GI history. She is status post Nissen fundoplication and a gastropexy which was complicated by severe gastroparesis. Patient developed chronic problems with nausea and vomiting which was intractable and present over a couple of years. She'll eventually had evaluation with Dr. Cecil Cobbs at [facility] and since underwent a subtotal gastrectomy and Roux-en-Y reconstruction in 2010. Since then she has been able to eat intermittently and has been supplemented with jejunal feedings.. Even after subtotal gastrectomy she continues to have problems with spitting which is not a regurgitation or emesis. She was last seen in our office in February 2013 and actually has done fairly well over the past year. She continues to do cyclic tube feedings at night with osmolite  3 cans per day. She comes in today because she says she's been having more problems over the past 3 weeks with abdominal discomfort and "spasms" she has not had any emesis but says she's really not been eating. She complains of feeling bloated and distended. Generally she has diarrhea but at this point has been more constipated and has not had a bowel movement over the past couple of days. She complains of an increase in belching and burping and some nausea. Abdominal films were done prior to this office visit to rule out partial small bowel obstruction and abdominal films were read as normal.    Review of Systems  Constitutional: Positive for appetite change.  HENT: Negative.   Eyes: Negative.   Respiratory: Negative.   Cardiovascular: Negative.   Gastrointestinal: Positive for abdominal pain and abdominal distention.  Genitourinary: Negative.   Musculoskeletal: Negative.   Neurological: Positive for weakness.  Hematological: Negative.    Psychiatric/Behavioral: Negative.    Outpatient Prescriptions Prior to Visit  Medication Sig Dispense Refill  . acetaminophen (TYLENOL) 500 MG tablet Take 1,000 mg by mouth every morning.      . loperamide (IMODIUM) 1 MG/5ML solution Take 4 mg by mouth 4 (four) times daily as needed. Diarrhea      . [DISCONTINUED] LORazepam (ATIVAN) 0.5 MG tablet Take 1 tablet by mouth three times daily as needed. NEEDS OFFICE VISIT FOR FURTHER REFILLS (PER DR BRODIE_  90 tablet  0  . [DISCONTINUED] ondansetron (ZOFRAN ODT) 4 MG disintegrating tablet Take 1 tablet (4 mg total) by mouth every 8 (eight) hours as needed for nausea.  20 tablet  1  . [DISCONTINUED] promethazine (PHENERGAN) 6.25 MG/5ML syrup Take 5 mLs (6.25 mg total) by mouth every 4 (four) hours as needed (This rx must last until 02/26/12). FOR NAUSEA/VOMITING  160 mL  0  . [DISCONTINUED] CARAFATE 1 GM/10ML suspension take 10 milliliters (2 TEASPOONFULS) by mouth four times a day  420 mL  1  . [DISCONTINUED] metoCLOPramide (REGLAN) 5 MG/5ML solution Take 5 mLs (5 mg total) by mouth 3 (three) times daily.  240 mL  2  . [DISCONTINUED] ondansetron (ZOFRAN-ODT) 4 MG disintegrating tablet dissolve 1 tablet ON TONGUE every 8 hours if needed for nausea  40 tablet  0  . [DISCONTINUED] promethazine (PHENERGAN) 6.25 MG/5ML syrup 5 ml by mouth once every 4-6 hours as needed for nausea  240 mL  1   Last reviewed on 04/26/2012 10:59 AM by Sammuel Cooper, PA  No Known Allergies Patient Active Problem List  Diagnosis  .  DM, UNCOMPLICATED, TYPE II  . HYPOKALEMIA  . ANEMIA, IRON DEFICIENCY  . DISORDER, DEPRESSIVE NEC  . HYPERTENSION  . 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 SINUS TACHYCARDIA  . HEMORRHOIDS, INTERNAL  . GERD  . GASTROPARESIS  . UNSPECIFIED GASTROSTOMY COMPLICATION  . DIVERTICULOSIS, COLON  . IRRITABLE BOWEL SYNDROME  . GI BLEED  . OSTEOARTHRITIS  . ARTHRITIS  . HIP PAIN, BILATERAL  . TROCHANTERIC BURSITIS, LEFT  . LEG PAIN,  RIGHT  . SLEEP APNEA  . WEIGHT LOSS-ABNORMAL  . DYSPNEA ON EXERTION  . CHEST PAIN, EXERTIONAL  . NAUSEA WITH VOMITING  . NAUSEA  . OTHER DYSPHAGIA  . ABDOMINAL PAIN, EPIGASTRIC  . Dysphagia   History  Substance Use Topics  . Smoking status: Current Every Day Smoker -- 0.2 packs/day    Last Attempt to Quit: 05/04/1994  . Smokeless tobacco: Never Used  . Alcohol Use: 1.2 oz/week    2 Cans of beer per week     Comment: last alcohol level 02/16/11- 133       Objective:   Physical Exam well-developed older African [name] in no acute distress, carries an  emesis basin with her for frequent spitting. HEENT; normocephalic EOMI PERRLA sclera anicteric, oropharynx moist, Neck; supple no JVD, Cardiovascular; regular rate and rhythm with S1-S2 no murmur or gallop, Pulmonary; clear bilaterally, Abdomen; soft bowel sounds are present, there is no focal tenderness, no palpable mass or hepatosplenomegaly, jejunal feeding tube benign. Rectal; not done, Extremities ;no clubbing cyanosis or edema skin warm and dry, Psych; mood and affect normal and appropriate.        Assessment & Plan:  #33 71 year old female status post subtotal gastrectomy and Roux-en-Y reconstruction 2010 done for severe gastroparesis and intractable nausea and vomiting. Patient continues on nocturnal jejunal feedings. Presents currently with complaints of nausea abdominal distention pain and obstipation. There is no evidence for small bowel obstruction on plain abdominal films.  suspect majority of her symptoms are secondary to obstipation  Plan; Will check CBC with differential,BMET,and EtOH level today Patient requests refill of Zofran 4 mg ODT every 4-6 hours as needed for nausea Patient  also requests refill of hydrocodone 5 501 every 4-6 hours as needed for pain to be used sparingly Patient is to purge her bowel with 2-3 doses of MiraLax daily over the next 3-4 days and then go to MiraLax 17 g once daily on a  regular basis Continue all other meds at same doses Followup with Dr. Juanda Chance in the office in 4-6 weeks

## 2012-04-26 NOTE — Progress Notes (Signed)
Reviewed and agree, she has done well in past 10 months.I will see her in follow up

## 2012-05-13 ENCOUNTER — Encounter: Payer: Self-pay | Admitting: Internal Medicine

## 2012-05-13 ENCOUNTER — Ambulatory Visit (INDEPENDENT_AMBULATORY_CARE_PROVIDER_SITE_OTHER): Payer: Medicare Other | Admitting: Internal Medicine

## 2012-05-13 VITALS — BP 100/64 | HR 120 | Ht 64.0 in | Wt 161.1 lb

## 2012-05-13 DIAGNOSIS — R633 Feeding difficulties, unspecified: Secondary | ICD-10-CM

## 2012-05-13 DIAGNOSIS — R195 Other fecal abnormalities: Secondary | ICD-10-CM

## 2012-05-13 DIAGNOSIS — K5730 Diverticulosis of large intestine without perforation or abscess without bleeding: Secondary | ICD-10-CM

## 2012-05-13 DIAGNOSIS — K3184 Gastroparesis: Secondary | ICD-10-CM

## 2012-05-13 DIAGNOSIS — K219 Gastro-esophageal reflux disease without esophagitis: Secondary | ICD-10-CM

## 2012-05-13 DIAGNOSIS — K5790 Diverticulosis of intestine, part unspecified, without perforation or abscess without bleeding: Secondary | ICD-10-CM

## 2012-05-13 MED ORDER — HYDROCODONE-ACETAMINOPHEN 5-500 MG PO TABS: 1.0000 | ORAL_TABLET | Freq: Four times a day (QID) | ORAL | Status: DC | PRN

## 2012-05-13 MED ORDER — MOVIPREP 100 G PO SOLR: 1.0000 | Freq: Once | ORAL | Status: DC

## 2012-05-13 MED ORDER — POLYETHYLENE GLYCOL 3350 17 GM/SCOOP PO POWD: 17.0000 g | ORAL | Status: DC | PRN

## 2012-05-13 NOTE — Patient Instructions (Addendum)
You have been given a separate informational sheet regarding your tobacco use, the importance of quitting and local resources to help you quit.  You have been scheduled for an endoscopy and colonoscopy with propofol. Please follow the written instructions given to you at your visit today. Please pick up your prep at the pharmacy within the next 1-3 days. If you use inhalers (even only as needed) or a CPAP machine, please bring them with you on the day of your procedure.  HOLD TUBE FEEDINGS 24 HOURS PRIOR TO YOUR ENDOSCOPY/COLONOSCOPY.  We have sent medications to your pharmacy for you to pick up at your convenience. Vicodin Miralax  CC: [facility]
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
## 2012-05-17 ENCOUNTER — Encounter: Payer: Self-pay | Admitting: Internal Medicine

## 2012-05-17 ENCOUNTER — Ambulatory Visit (AMBULATORY_SURGERY_CENTER): Payer: Medicare Other | Admitting: Internal Medicine

## 2012-05-17 ENCOUNTER — Other Ambulatory Visit: Payer: Self-pay | Admitting: Physician Assistant

## 2012-05-17 VITALS — BP 134/69 | HR 100 | Temp 99.1°F | Resp 21 | Ht 64.0 in | Wt 161.0 lb

## 2012-05-17 DIAGNOSIS — K224 Dyskinesia of esophagus: Secondary | ICD-10-CM

## 2012-05-17 DIAGNOSIS — Z800 Family history of malignant neoplasm of digestive organs: Secondary | ICD-10-CM

## 2012-05-17 DIAGNOSIS — K922 Gastrointestinal hemorrhage, unspecified: Secondary | ICD-10-CM

## 2012-05-17 DIAGNOSIS — K648 Other hemorrhoids: Secondary | ICD-10-CM

## 2012-05-17 DIAGNOSIS — K2100 Gastro-esophageal reflux disease with esophagitis, without bleeding: Secondary | ICD-10-CM

## 2012-05-17 DIAGNOSIS — K3184 Gastroparesis: Secondary | ICD-10-CM

## 2012-05-17 DIAGNOSIS — K219 Gastro-esophageal reflux disease without esophagitis: Secondary | ICD-10-CM

## 2012-05-17 DIAGNOSIS — R195 Other fecal abnormalities: Secondary | ICD-10-CM

## 2012-05-17 DIAGNOSIS — K5730 Diverticulosis of large intestine without perforation or abscess without bleeding: Secondary | ICD-10-CM

## 2012-05-17 LAB — GLUCOSE, CAPILLARY: Glucose-Capillary: 172 mg/dL — ABNORMAL HIGH (ref 70–99)

## 2012-05-17 MED ORDER — SODIUM CHLORIDE 0.9 % IV SOLN: 500.0000 mL | INTRAVENOUS | Status: DC

## 2012-05-17 NOTE — Progress Notes (Signed)
Patient did not experience any of the following events: a burn prior to discharge; a fall within the facility; wrong site/side/patient/procedure/implant event; or a hospital transfer or hospital admission upon discharge from the facility. [phone]) Patient did not have preoperative order for IV antibiotic SSI prophylaxis. [phone])   Pt was here a little longer than 30 mins because when she sat up the felt nausea, let pt sit still for a little while and take in some water, pt felt better when getting dressed and leaving

## 2012-05-17 NOTE — Progress Notes (Addendum)
Pt has feeding tube. ewm  NO ALLERGY TO EGGS OR SOY. EWM

## 2012-05-17 NOTE — Progress Notes (Signed)
Pt tolerated both procedures well To RR HOB elevated. Stable

## 2012-05-17 NOTE — Op Note (Signed)
[facility] [address] [place], [address]   COLONOSCOPY PROCEDURE REPORT  PATIENT: Emily [name], Emily [name]  MR#: [id number] BIRTHDATE: May 26, 1940 , 71  yrs. old GENDER: Female ENDOSCOPIST: Hart Carwin, MD REFERRED BY:  Dr Georganna Skeans PROCEDURE DATE:  05/17/2012 PROCEDURE:   Colonoscopy, screening ASA CLASS:   Class III INDICATIONS:Patient's immediate family history of colon cancer and heme-positive stool.  mother with colon cancer, last colon 2008 MEDICATIONS: MAC sedation, administered by CRNA and propofol (Diprivan) 200mg  IV  DESCRIPTION OF PROCEDURE:   After the risks and benefits and of the procedure were explained, informed consent was obtained.  A digital rectal exam revealed no abnormalities of the rectum.    The LB [id number]  endoscope was introduced through the anus and advanced to the cecum, which was identified by both the appendix and ileocecal valve .  The quality of the prep was good, using MoviPrep .  The instrument was then slowly withdrawn as the colon was fully examined.     COLON FINDINGS: There was moderate diverticulosis noted in the sigmoid colon with associated angulation, tortuosity, muscular hypertrophy and colonic spasm. difficult exam due to severe spasm 20-40 cm,    Retroflexed views revealed no abnormalities.     The scope was then withdrawn from the patient and the procedure completed.  COMPLICATIONS: There were no complications. ENDOSCOPIC IMPRESSION: There was moderate diverticulosis noted in the sigmoid colon First grade hemorrhoids  RECOMMENDATIONS:  Heme positive stool likely due to hemorrhoidas, or due to a bleeding site at the feeding jejunostomy monitor CBC, Iron levels resume jejunal tube feeding  REPEAT EXAM: In 5 year(s)  for Colonoscopy.  cc:  _______________________________ eSignedHart Carwin, MD 05/17/2012 3:01 PM     PATIENT NAME:  Emily [name] MR#: [id number]

## 2012-05-17 NOTE — Patient Instructions (Addendum)
YOU HAD AN ENDOSCOPIC PROCEDURE TODAY AT THE [facility]: Refer to the procedure report that was given to you for any specific questions about what was found during the examination.  If the procedure report does not answer your questions, please call your gastroenterologist to clarify.  If you requested that your care partner not be given the details of your procedure findings, then the procedure report has been included in a sealed envelope for you to review at your convenience later.  YOU SHOULD EXPECT: Some feelings of bloating in the abdomen. Passage of more gas than usual.  Walking can help get rid of the air that was put into your GI tract during the procedure and reduce the bloating. If you had a lower endoscopy (such as a colonoscopy or flexible sigmoidoscopy) you may notice spotting of blood in your stool or on the toilet paper. If you underwent a bowel prep for your procedure, then you may not have a normal bowel movement for a few days.  DIET: Your first meal following the procedure should be a light meal and then it is ok to progress to your normal diet.  A half-sandwich or bowl of soup is an example of a good first meal.  Heavy or fried foods are harder to digest and may make you feel nauseous or bloated.  Likewise meals heavy in dairy and vegetables can cause extra gas to form and this can also increase the bloating.  Drink plenty of fluids but you should avoid alcoholic beverages for 24 hours.  ACTIVITY: Your care partner should take you home directly after the procedure.  You should plan to take it easy, moving slowly for the rest of the day.  You can resume normal activity the day after the procedure however you should NOT DRIVE or use heavy machinery for 24 hours (because of the sedation medicines used during the test).    SYMPTOMS TO REPORT IMMEDIATELY: A gastroenterologist can be reached at any hour.  During normal business hours, 8:30 AM to 5:00 PM Monday through Friday,  call [phone].  After hours and on weekends, please call the GI answering service at [phone] who will take a message and have the physician on call contact you.   Following lower endoscopy (colonoscopy or flexible sigmoidoscopy):  Excessive amounts of blood in the stool  Significant tenderness or worsening of abdominal pains  Swelling of the abdomen that is new, acute  Fever of 100F or higher  Following upper endoscopy (EGD)  Vomiting of blood or coffee ground material  New chest pain or pain under the shoulder blades  Painful or persistently difficult swallowing  New shortness of breath  Fever of 100F or higher  Black, tarry-looking stools  FOLLOW UP: If any biopsies were taken you will be contacted by phone or by letter within the next 1-3 weeks.  Call your gastroenterologist if you have not heard about the biopsies in 3 weeks.  Our staff will call the home number listed on your records the next business day following your procedure to check on you and address any questions or concerns that you may have at that time regarding the information given to you following your procedure. This is a courtesy call and so if there is no answer at the home number and we have not heard from you through the emergency physician on call, we will assume that you have returned to your regular daily activities without incident.  SIGNATURES/CONFIDENTIALITY: You and/or your care   partner have signed paperwork which will be entered into your electronic medical record.  These signatures attest to the fact that that the information above on your After Visit Summary has been reviewed and is understood.  Full responsibility of the confidentiality of this discharge information lies with you and/or your care-partner.    Continue Reglan three time a day  Diverticulosis-handout given  Hemorrhoids-see below  Monitor CBC, Iron Levels  Resume jejunal tube feeding  Only take ativan at  bedtime       Hemorrhoids Hemorrhoids are enlarged (dilated) veins around the rectum. There are 2 types of hemorrhoids, and the type of hemorrhoid is determined by its location. Internal hemorrhoids occur in the veins just inside the rectum.They are usually not painful, but they may bleed.However, they may poke through to the outside and become irritated and painful. External hemorrhoids involve the veins outside the anus and can be felt as a painful swelling or hard lump near the anus.They are often itchy and may crack and bleed. Sometimes clots will form in the veins. This makes them swollen and painful. These are called thrombosed hemorrhoids. CAUSES Causes of hemorrhoids include:  Pregnancy. This increases the pressure in the hemorrhoidal veins.  Constipation.  Straining to have a bowel movement.  Obesity.  Heavy lifting or other activity that caused you to strain. TREATMENT Most of the time hemorrhoids improve in 1 to 2 weeks. However, if symptoms do not seem to be getting better or if you have a lot of rectal bleeding, your caregiver may perform a procedure to help make the hemorrhoids get smaller or remove them completely.Possible treatments include:  Rubber band ligation. A rubber band is placed at the base of the hemorrhoid to cut off the circulation.  Sclerotherapy. A chemical is injected to shrink the hemorrhoid.  Infrared light therapy. Tools are used to burn the hemorrhoid.  Hemorrhoidectomy. This is surgical removal of the hemorrhoid. HOME CARE INSTRUCTIONS   Increase fiber in your diet. Ask your caregiver about using fiber supplements.  Drink enough water and fluids to keep your urine clear or pale yellow.  Exercise regularly.  Go to the bathroom when you have the urge to have a bowel movement. Do not wait.  Avoid straining to have bowel movements.  Keep the anal area dry and clean.  Only take over-the-counter or prescription medicines for pain,  discomfort, or fever as directed by your caregiver. If your hemorrhoids are thrombosed:  Take warm sitz baths for 20 to 30 minutes, 3 to 4 times per day.  If the hemorrhoids are very tender and swollen, place ice packs on the area as tolerated. Using ice packs between sitz baths may be helpful. Fill a plastic bag with ice. Place a towel between the bag of ice and your skin.  Medicated creams and suppositories may be used or applied as directed.  Do not use a donut-shaped pillow or sit on the toilet for long periods. This increases blood pooling and pain. SEEK MEDICAL CARE IF:   You have increasing pain and swelling that is not controlled with your medicine.  You have uncontrolled bleeding.  You have difficulty or you are unable to have a bowel movement.  You have pain or inflammation outside the area of the hemorrhoids.  You have chills or an oral temperature above 102 F (38.9 C). MAKE SURE YOU:   Understand these instructions.  Will watch your condition.  Will get help right away if you are not doing well or  get worse. Document Released: 04/17/2000 Document Revised: 07/13/2011 Document Reviewed: 03/31/2010 [facility] Patient Information 2013 [place], [place].

## 2012-05-17 NOTE — Op Note (Signed)
[facility] [address] [name], [address]   ENDOSCOPY PROCEDURE REPORT  PATIENT: Emily [name], Emily [name]  MR#: [id number] BIRTHDATE: [birth year] , 71  yrs. old GENDER: Female ENDOSCOPIST: Hart Carwin, MD REFERRED BY:  Dr Georganna Skeans PROCEDURE DATE:  05/17/2012 PROCEDURE:  EGD, diagnostic ASA CLASS:     Class III INDICATIONS:  Heme positive stool.   Heartburn.   Nausea. Epigastric pain.   Chest pain.   hx if esophagectomy for intractable reflux/failed Nissed x2 and [name] gastropexy, s/p total gastrectomy and gastrojejunostomy for gastroparesis, last exam 2012. MEDICATIONS: MAC sedation, administered by CRNA and propofol (Diprivan) 100mg  IV TOPICAL ANESTHETIC: none  DESCRIPTION OF PROCEDURE: After the risks benefits and alternatives of the procedure were thoroughly explained, informed consent was obtained.  The LB GIF-H180 [id number] endoscope was introduced through the mouth and advanced to the proximal jejunum. Without limitations.  The instrument was slowly withdrawn as the mucosa was fully examined.      Esophagus: retained saliva in thge esophagus, large amount suctioned, no stricture, concentrically thicken aperistaltic esophagus, with  discrete z-line, no bleeding, s/p total gastrectomy ans esophago jejunal anastomosis with a small 8 cm pouch and Roux-en-Y anastomosis. Normal appearing  jejunum to 90 cm[          The scope was then withdrawn from the patient and the procedure completed.  COMPLICATIONS: There were no complications. ENDOSCOPIC IMPRESSION:  1. prior esophagojejunal nanstomosis for benign disease 2.aperistaltic thickened esophagus with retained secretions and saliva 3.widely patent Roux-en-Y esophagojejunostomy, nothing to explain heme positive stool RECOMMENDATIONS: antireflux measures continue PPI bid, resume jejunal tube feedings continue Reglan  tid colonoscopy  REPEAT EXAM:  eSigned:  Hart Carwin, MD 05/17/2012 2:55  PM   CC:  PATIENT NAME:  [name], [name] MR#: [id number]

## 2012-05-18 ENCOUNTER — Other Ambulatory Visit: Payer: Self-pay | Admitting: *Deleted

## 2012-05-18 ENCOUNTER — Telehealth: Payer: Self-pay | Admitting: *Deleted

## 2012-05-18 LAB — GLUCOSE, CAPILLARY: Glucose-Capillary: 157 mg/dL — ABNORMAL HIGH (ref 70–99)

## 2012-05-18 NOTE — Telephone Encounter (Signed)
  Follow up Call-  Call back number 05/17/2012 02/15/2012 04/02/2011  Post procedure Call Back phone  # [phone] ok to leave a messag  Permission to leave phone message Yes No -     Patient questions:  Do you have a fever, pain , or abdominal swelling? no Pain Score  0 *  Have you tolerated food without any problems? yes  Have you been able to return to your normal activities? yes  Do you have any questions about your discharge instructions: Diet   no Medications  no Follow up visit  no  Do you have questions or concerns about your Care? no  Actions: * If pain score is 4 or above: No action needed, pain <4.

## 2012-05-21 IMAGING — RF IR REPLACE G-TUBE/COLONIC TUBE
2 series · 2 of 2 positions shown · non-contrast
Comparison: 10/10/2010

CLINICAL DATA: Gastroparesis.  Chronic indwelling balloon retention
jejunostomy tube, ruptured in its external portion.

REPLACEMENT OF JEJUNOSTOMY FEEDING CATHETER UNDER FLUOROSCOPY

[Series 1: run · 1 of 1 slices shown (1 of 2)]
[im 1/1]
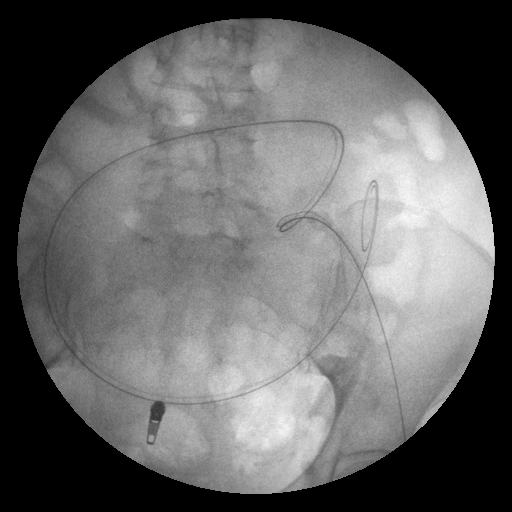

[Series 2: run · 1 of 1 slices shown (2 of 2)]
[im 1/1]
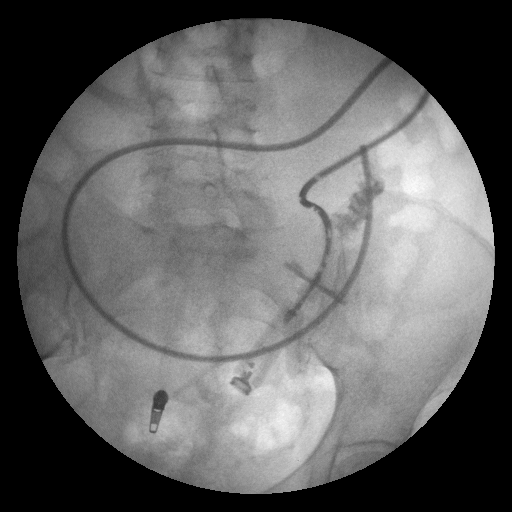

[2 of 2 positions shown; findings below may reference images not displayed]

Technique and findings:  Preexisting jejunostomy catheter was
inspected and rupture confirmed.  The catheter was   prepped with
Betadine. Retention balloon was deflated. The tube was retracted
under intermittent fluoroscopy over a hydrophilic guide wire. A new
16-French balloon retention jejunostomy tube was advanced over a
wire. The catheter position was confirmed under fluoroscopy after
contrast injection. The retention balloon was inflated with 10 ml
of saline.
IMPRESSION: Replacement of   ruptured 16-French balloon retention jejunostomy
tube, ready for routine use.

## 2012-05-23 ENCOUNTER — Other Ambulatory Visit: Payer: Self-pay | Admitting: *Deleted

## 2012-05-23 MED ORDER — LORAZEPAM 0.5 MG PO TABS: ORAL_TABLET | ORAL | Status: DC

## 2012-05-27 ENCOUNTER — Other Ambulatory Visit: Payer: Self-pay | Admitting: Physician Assistant

## 2012-06-08 ENCOUNTER — Other Ambulatory Visit: Payer: Self-pay | Admitting: Physician Assistant

## 2012-06-09 NOTE — Telephone Encounter (Signed)
Send further refill request to Dr. [name].

## 2012-06-10 ENCOUNTER — Telehealth: Payer: Self-pay | Admitting: Internal Medicine

## 2012-06-10 ENCOUNTER — Inpatient Hospital Stay (HOSPITAL_COMMUNITY)
Admission: EM | Admit: 2012-06-10 | Discharge: 2012-06-17 | DRG: 392 | Disposition: A | Discharge status: Home Health | Payer: Medicare Other | Attending: Internal Medicine | Admitting: Internal Medicine

## 2012-06-10 ENCOUNTER — Emergency Department (HOSPITAL_COMMUNITY): Payer: Medicare Other

## 2012-06-10 ENCOUNTER — Encounter (HOSPITAL_COMMUNITY): Payer: Self-pay | Admitting: Emergency Medicine

## 2012-06-10 DIAGNOSIS — R110 Nausea: Secondary | ICD-10-CM

## 2012-06-10 DIAGNOSIS — Z434 Encounter for attention to other artificial openings of digestive tract: Secondary | ICD-10-CM

## 2012-06-10 DIAGNOSIS — K589 Irritable bowel syndrome without diarrhea: Secondary | ICD-10-CM | POA: Diagnosis present

## 2012-06-10 DIAGNOSIS — R112 Nausea with vomiting, unspecified: Secondary | ICD-10-CM

## 2012-06-10 DIAGNOSIS — E669 Obesity, unspecified: Secondary | ICD-10-CM | POA: Diagnosis present

## 2012-06-10 DIAGNOSIS — E1169 Type 2 diabetes mellitus with other specified complication: Secondary | ICD-10-CM | POA: Diagnosis present

## 2012-06-10 DIAGNOSIS — I10 Essential (primary) hypertension: Secondary | ICD-10-CM

## 2012-06-10 DIAGNOSIS — K9403 Colostomy malfunction: Secondary | ICD-10-CM | POA: Diagnosis present

## 2012-06-10 DIAGNOSIS — R1013 Epigastric pain: Secondary | ICD-10-CM

## 2012-06-10 DIAGNOSIS — F329 Major depressive disorder, single episode, unspecified: Secondary | ICD-10-CM | POA: Diagnosis present

## 2012-06-10 DIAGNOSIS — M25551 Pain in right hip: Secondary | ICD-10-CM

## 2012-06-10 DIAGNOSIS — D649 Anemia, unspecified: Secondary | ICD-10-CM

## 2012-06-10 DIAGNOSIS — E119 Type 2 diabetes mellitus without complications: Secondary | ICD-10-CM

## 2012-06-10 DIAGNOSIS — G8929 Other chronic pain: Secondary | ICD-10-CM | POA: Diagnosis present

## 2012-06-10 DIAGNOSIS — Z79899 Other long term (current) drug therapy: Secondary | ICD-10-CM

## 2012-06-10 DIAGNOSIS — E860 Dehydration: Secondary | ICD-10-CM

## 2012-06-10 DIAGNOSIS — K5289 Other specified noninfective gastroenteritis and colitis: Principal | ICD-10-CM

## 2012-06-10 DIAGNOSIS — M1990 Unspecified osteoarthritis, unspecified site: Secondary | ICD-10-CM

## 2012-06-10 DIAGNOSIS — R1319 Other dysphagia: Secondary | ICD-10-CM

## 2012-06-10 DIAGNOSIS — F17200 Nicotine dependence, unspecified, uncomplicated: Secondary | ICD-10-CM | POA: Diagnosis present

## 2012-06-10 DIAGNOSIS — K3184 Gastroparesis: Secondary | ICD-10-CM

## 2012-06-10 DIAGNOSIS — G4730 Sleep apnea, unspecified: Secondary | ICD-10-CM | POA: Diagnosis present

## 2012-06-10 DIAGNOSIS — Z9884 Bariatric surgery status: Secondary | ICD-10-CM

## 2012-06-10 DIAGNOSIS — K219 Gastro-esophageal reflux disease without esophagitis: Secondary | ICD-10-CM | POA: Diagnosis present

## 2012-06-10 DIAGNOSIS — Z6829 Body mass index (BMI) 29.0-29.9, adult: Secondary | ICD-10-CM

## 2012-06-10 DIAGNOSIS — F3289 Other specified depressive episodes: Secondary | ICD-10-CM | POA: Diagnosis present

## 2012-06-10 DIAGNOSIS — K9413 Enterostomy malfunction: Secondary | ICD-10-CM | POA: Diagnosis present

## 2012-06-10 LAB — URINE MICROSCOPIC-ADD ON

## 2012-06-10 LAB — CBC WITH DIFFERENTIAL/PLATELET
Basophils Absolute: 0 10*3/uL (ref 0.0–0.1)
Basophils Relative: 0 % (ref 0–1)
Eosinophils Absolute: 0 10*3/uL (ref 0.0–0.7)
Eosinophils Relative: 0 % (ref 0–5)
HCT: 32.8 % — ABNORMAL LOW (ref 36.0–46.0)
Hemoglobin: 10.5 g/dL — ABNORMAL LOW (ref 12.0–15.0)
Lymphocytes Relative: 8 % — ABNORMAL LOW (ref 12–46)
Lymphs Abs: 0.9 10*3/uL (ref 0.7–4.0)
MCH: 25.8 pg — ABNORMAL LOW (ref 26.0–34.0)
MCHC: 32 g/dL (ref 30.0–36.0)
MCV: 80.6 fL (ref 78.0–100.0)
Monocytes Absolute: 0.8 10*3/uL (ref 0.1–1.0)
Monocytes Relative: 7 % (ref 3–12)
Neutro Abs: 9 10*3/uL — ABNORMAL HIGH (ref 1.7–7.7)
Neutrophils Relative %: 84 % — ABNORMAL HIGH (ref 43–77)
Platelets: 370 10*3/uL (ref 150–400)
RBC: 4.07 MIL/uL (ref 3.87–5.11)
RDW: 14.5 % (ref 11.5–15.5)
WBC: 10.7 10*3/uL — ABNORMAL HIGH (ref 4.0–10.5)

## 2012-06-10 LAB — URINALYSIS, ROUTINE W REFLEX MICROSCOPIC
Glucose, UA: NEGATIVE mg/dL
Hgb urine dipstick: NEGATIVE
Nitrite: POSITIVE — AB
Protein, ur: 30 mg/dL — AB
Specific Gravity, Urine: >1.046 — ABNORMAL HIGH (ref 1.005–1.030)
Urobilinogen, UA: 1 mg/dL (ref 0.0–1.0)
pH: 5.5 (ref 5.0–8.0)

## 2012-06-10 LAB — COMPREHENSIVE METABOLIC PANEL
ALT: 53 U/L — ABNORMAL HIGH (ref 0–35)
AST: 152 U/L — ABNORMAL HIGH (ref 0–37)
Albumin: 2.5 g/dL — ABNORMAL LOW (ref 3.5–5.2)
Alkaline Phosphatase: 148 U/L — ABNORMAL HIGH (ref 39–117)
BUN: 12 mg/dL (ref 6–23)
CO2: 23 mEq/L (ref 19–32)
Calcium: 8.4 mg/dL (ref 8.4–10.5)
Chloride: 106 mEq/L (ref 96–112)
Creatinine, Ser: 0.69 mg/dL (ref 0.50–1.10)
GFR calc Af Amer: >90 mL/min (ref >90)
GFR calc non Af Amer: 86 mL/min — ABNORMAL LOW (ref >90)
Glucose, Bld: 124 mg/dL — ABNORMAL HIGH (ref 70–99)
Potassium: 3.5 mEq/L (ref 3.5–5.1)
Sodium: 143 mEq/L (ref 135–145)
Total Bilirubin: 0.6 mg/dL (ref 0.3–1.2)
Total Protein: 6.3 g/dL (ref 6.0–8.3)

## 2012-06-10 LAB — LIPASE, BLOOD: Lipase: 41 U/L (ref 11–59)

## 2012-06-10 MED ORDER — IOHEXOL 300 MG/ML  SOLN: 50.0000 mL | Freq: Once | INTRAMUSCULAR | Status: AC | PRN

## 2012-06-10 MED ORDER — SODIUM CHLORIDE 0.9 % IV BOLUS (SEPSIS)
1000.0000 mL | Freq: Once | INTRAVENOUS | Status: AC
  Administered 2012-06-10: 1000 mL via INTRAVENOUS

## 2012-06-10 MED ORDER — MORPHINE SULFATE 4 MG/ML IJ SOLN
4.0000 mg | Freq: Once | INTRAMUSCULAR | Status: AC
  Administered 2012-06-10: 4 mg via INTRAVENOUS
  Filled 2012-06-10: qty 1

## 2012-06-10 MED ORDER — IOHEXOL 300 MG/ML  SOLN: 100.0000 mL | Freq: Once | INTRAMUSCULAR | Status: AC | PRN

## 2012-06-10 MED ORDER — PROMETHAZINE HCL 25 MG/ML IJ SOLN
12.5000 mg | Freq: Once | INTRAMUSCULAR | Status: AC
  Administered 2012-06-10: 12.5 mg via INTRAVENOUS
  Filled 2012-06-10: qty 1

## 2012-06-10 MED ORDER — SODIUM CHLORIDE 0.9 % IV BOLUS (SEPSIS): 1000.0000 mL | Freq: Once | INTRAVENOUS | Status: DC

## 2012-06-10 MED ORDER — ACETAMINOPHEN 325 MG PO TABS
650.0000 mg | ORAL_TABLET | Freq: Once | ORAL | Status: AC
  Administered 2012-06-10: 650 mg via ORAL
  Filled 2012-06-10: qty 2

## 2012-06-10 NOTE — ED Notes (Signed)
Unable to obtain IV access. IV team notified

## 2012-06-10 NOTE — ED Notes (Signed)
Pt reports nausea and vomiting and chills for 1 week with lower abdominal pain 8/10.

## 2012-06-10 NOTE — ED Notes (Signed)
Pt refusing tylenol at this time
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
## 2012-06-10 NOTE — ED Notes (Signed)
Patient still unable to void.

## 2012-06-10 NOTE — Telephone Encounter (Signed)
Patient calling to report she is having chills. States her daughter is coming to take her to ED. States she had vomiting last night and a little bit today. She also c/o some diarrhea but not "bad." She does not know if she has a fever just feels cold. Wanted Dr. Juanda Chance to know she is going to ED to be evaluated.

## 2012-06-10 NOTE — ED Notes (Signed)
Took patient to bathroom still unable to void.

## 2012-06-11 ENCOUNTER — Encounter (HOSPITAL_COMMUNITY): Payer: Self-pay | Admitting: Internal Medicine

## 2012-06-11 DIAGNOSIS — K3184 Gastroparesis: Secondary | ICD-10-CM

## 2012-06-11 DIAGNOSIS — E860 Dehydration: Secondary | ICD-10-CM

## 2012-06-11 DIAGNOSIS — E119 Type 2 diabetes mellitus without complications: Secondary | ICD-10-CM

## 2012-06-11 DIAGNOSIS — R1013 Epigastric pain: Secondary | ICD-10-CM

## 2012-06-11 LAB — GLUCOSE, CAPILLARY
Glucose-Capillary: 127 mg/dL — ABNORMAL HIGH (ref 70–99)
Glucose-Capillary: 128 mg/dL — ABNORMAL HIGH (ref 70–99)
Glucose-Capillary: 79 mg/dL (ref 70–99)
Glucose-Capillary: 81 mg/dL (ref 70–99)
Glucose-Capillary: 83 mg/dL (ref 70–99)
Glucose-Capillary: 92 mg/dL (ref 70–99)

## 2012-06-11 MED ORDER — ONDANSETRON HCL 4 MG/2ML IJ SOLN
4.0000 mg | Freq: Once | INTRAMUSCULAR | Status: AC
  Administered 2012-06-11: 4 mg via INTRAVENOUS
  Filled 2012-06-11 (×2): qty 2

## 2012-06-11 MED ORDER — INFLUENZA VIRUS VACC SPLIT PF IM SUSP
0.5000 mL | INTRAMUSCULAR | Status: DC
  Filled 2012-06-11 (×2): qty 0.5

## 2012-06-11 MED ORDER — ONDANSETRON HCL 4 MG/2ML IJ SOLN
4.0000 mg | Freq: Four times a day (QID) | INTRAMUSCULAR | Status: DC | PRN
  Administered 2012-06-11: 4 mg via INTRAVENOUS
  Filled 2012-06-11: qty 2

## 2012-06-11 MED ORDER — ONDANSETRON 8 MG PO TBDP
ORAL_TABLET | ORAL | Status: AC
  Filled 2012-06-11: qty 1

## 2012-06-11 MED ORDER — INSULIN ASPART 100 UNIT/ML ~~LOC~~ SOLN
0.0000 [IU] | SUBCUTANEOUS | Status: DC
  Administered 2012-06-11 – 2012-06-14 (×4): 2 [IU] via SUBCUTANEOUS
  Administered 2012-06-14: 3 [IU] via SUBCUTANEOUS
  Administered 2012-06-15: 2 [IU] via SUBCUTANEOUS
  Administered 2012-06-15: 3 [IU] via SUBCUTANEOUS
  Administered 2012-06-16: 2 [IU] via SUBCUTANEOUS

## 2012-06-11 MED ORDER — ONDANSETRON HCL 4 MG PO TABS: 4.0000 mg | ORAL_TABLET | Freq: Four times a day (QID) | ORAL | Status: DC | PRN

## 2012-06-11 MED ORDER — PROMETHAZINE HCL 25 MG/ML IJ SOLN
25.0000 mg | Freq: Four times a day (QID) | INTRAMUSCULAR | Status: DC | PRN
  Administered 2012-06-11 – 2012-06-15 (×14): 25 mg via INTRAVENOUS
  Filled 2012-06-11 (×12): qty 1

## 2012-06-11 MED ORDER — PROMETHAZINE HCL 25 MG/ML IJ SOLN
12.5000 mg | Freq: Once | INTRAMUSCULAR | Status: DC
  Filled 2012-06-11 (×5): qty 1

## 2012-06-11 MED ORDER — LORAZEPAM 0.5 MG PO TABS
0.5000 mg | ORAL_TABLET | Freq: Three times a day (TID) | ORAL | Status: DC | PRN
  Administered 2012-06-13: 0.5 mg via ORAL
  Filled 2012-06-11: qty 1

## 2012-06-11 MED ORDER — CIPROFLOXACIN IN D5W 400 MG/200ML IV SOLN: 400.0000 mg | Freq: Once | INTRAVENOUS | Status: DC

## 2012-06-11 MED ORDER — ENOXAPARIN SODIUM 40 MG/0.4ML ~~LOC~~ SOLN
40.0000 mg | SUBCUTANEOUS | Status: DC
  Administered 2012-06-11 – 2012-06-17 (×7): 40 mg via SUBCUTANEOUS
  Filled 2012-06-11 (×7): qty 0.4

## 2012-06-11 MED ORDER — METOCLOPRAMIDE HCL 5 MG PO TABS
5.0000 mg | ORAL_TABLET | Freq: Four times a day (QID) | ORAL | Status: DC
  Administered 2012-06-11: 5 mg via ORAL
  Filled 2012-06-11 (×4): qty 1

## 2012-06-11 MED ORDER — MORPHINE SULFATE 4 MG/ML IJ SOLN
INTRAMUSCULAR | Status: AC
  Filled 2012-06-11: qty 1

## 2012-06-11 MED ORDER — METRONIDAZOLE IN NACL 5-0.79 MG/ML-% IV SOLN: 500.0000 mg | Freq: Once | INTRAVENOUS | Status: DC

## 2012-06-11 MED ORDER — HYDROCODONE-ACETAMINOPHEN 5-325 MG PO TABS
1.0000 | ORAL_TABLET | ORAL | Status: DC | PRN
  Administered 2012-06-11 – 2012-06-16 (×10): 1 via ORAL
  Filled 2012-06-11 (×10): qty 1

## 2012-06-11 MED ORDER — METRONIDAZOLE IN NACL 5-0.79 MG/ML-% IV SOLN
500.0000 mg | Freq: Three times a day (TID) | INTRAVENOUS | Status: DC
  Administered 2012-06-11 – 2012-06-15 (×13): 500 mg via INTRAVENOUS
  Filled 2012-06-11 (×15): qty 100

## 2012-06-11 MED ORDER — METOCLOPRAMIDE HCL 5 MG/ML IJ SOLN
10.0000 mg | Freq: Once | INTRAMUSCULAR | Status: AC
  Administered 2012-06-11: 10 mg via INTRAVENOUS
  Filled 2012-06-11: qty 2

## 2012-06-11 MED ORDER — DEXTROSE-NACL 5-0.9 % IV SOLN: INTRAVENOUS | Status: DC

## 2012-06-11 MED ORDER — SODIUM CHLORIDE 0.9 % IV SOLN
INTRAVENOUS | Status: DC
  Administered 2012-06-11: 07:00:00 via INTRAVENOUS

## 2012-06-11 MED ORDER — METOCLOPRAMIDE HCL 5 MG PO TABS
5.0000 mg | ORAL_TABLET | Freq: Three times a day (TID) | ORAL | Status: DC
  Administered 2012-06-11 – 2012-06-15 (×16): 5 mg via ORAL
  Filled 2012-06-11 (×19): qty 1

## 2012-06-11 MED ORDER — CIPROFLOXACIN IN D5W 400 MG/200ML IV SOLN
400.0000 mg | Freq: Two times a day (BID) | INTRAVENOUS | Status: DC
  Administered 2012-06-11 – 2012-06-15 (×8): 400 mg via INTRAVENOUS
  Filled 2012-06-11 (×10): qty 200

## 2012-06-11 MED ORDER — MORPHINE SULFATE 2 MG/ML IJ SOLN
1.0000 mg | INTRAMUSCULAR | Status: DC | PRN
  Administered 2012-06-11 – 2012-06-17 (×26): 1 mg via INTRAVENOUS
  Filled 2012-06-11 (×25): qty 1

## 2012-06-11 MED ORDER — IOHEXOL 300 MG/ML  SOLN
100.0000 mL | Freq: Once | INTRAMUSCULAR | Status: AC | PRN
  Administered 2012-06-11: 100 mL via INTRAVENOUS

## 2012-06-11 NOTE — Progress Notes (Signed)
Patient was seen and evaluated earlier this AM.  Please refer to his H and P for details regarding Assessment and Plan.  Nursing reported that patient has had nausea and emesis despite zofran administration.  Pt also reports that phenergan has worked better for her in the past as such is requesting phenergan at this juncture.  Will transition to phenergan today.  Reassess next am.  Emily [name]

## 2012-06-11 NOTE — Progress Notes (Signed)
Utilization review completed.  
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
## 2012-06-12 DIAGNOSIS — R112 Nausea with vomiting, unspecified: Secondary | ICD-10-CM

## 2012-06-12 LAB — CBC
HCT: 27.5 % — ABNORMAL LOW (ref 36.0–46.0)
Hemoglobin: 8.7 g/dL — ABNORMAL LOW (ref 12.0–15.0)
MCH: 25.7 pg — ABNORMAL LOW (ref 26.0–34.0)
MCHC: 31.6 g/dL (ref 30.0–36.0)
MCV: 81.4 fL (ref 78.0–100.0)
Platelets: 315 10*3/uL (ref 150–400)
RBC: 3.38 MIL/uL — ABNORMAL LOW (ref 3.87–5.11)
RDW: 14.6 % (ref 11.5–15.5)
WBC: 7.6 10*3/uL (ref 4.0–10.5)

## 2012-06-12 LAB — GLUCOSE, CAPILLARY
Glucose-Capillary: 86 mg/dL (ref 70–99)
Glucose-Capillary: 108 mg/dL — ABNORMAL HIGH (ref 70–99)
Glucose-Capillary: 87 mg/dL (ref 70–99)
Glucose-Capillary: 89 mg/dL (ref 70–99)
Glucose-Capillary: 96 mg/dL (ref 70–99)

## 2012-06-12 MED ORDER — INFLUENZA VIRUS VACC SPLIT PF IM SUSP
0.5000 mL | INTRAMUSCULAR | Status: AC
  Administered 2012-06-13: 0.5 mL via INTRAMUSCULAR
  Filled 2012-06-12 (×2): qty 0.5

## 2012-06-12 MED ORDER — KCL IN DEXTROSE-NACL 20-5-0.45 MEQ/L-%-% IV SOLN
INTRAVENOUS | Status: DC
  Administered 2012-06-12 – 2012-06-14 (×3): via INTRAVENOUS
  Filled 2012-06-12 (×6): qty 1000

## 2012-06-12 NOTE — Progress Notes (Signed)
[facility] PROGRESS NOTE  Emily [name] FAO:[id number] DOB: 10-09-1940 DOA: 06/10/2012 PCP: Georganna Skeans, MD  Assessment/Plan: 1. Colitis - Will plan on continuing IV antibiotics cipro and flagyl - Patient improved on antibiotics and nausea and abdominal discomfort decreased.  - Ct of abdomen and pelvis reported colonic wall thickening. - keep patient npo.  Patient gets nutrition via enteral feeds.  Given improvement in condition will plan on consulting dietitian to start planning regarding commencement of enteral tube feeds. - maintain patient on IVF's while patient is npo - WBC within normal limits.  2. Nausea and vomiting - most likely 2ary to # 1 and currently improved - continue supportive care - antiemetics  3. DM  - Insulin coverage - monitor blood sugars.  4. Dehydration  - likely from 1 and 2 - continue MIVF's  - plan on initiating enteral tube feeds with continued improvement in condition.   Code Status: full Family Communication: no family at bedside. Disposition Plan: Pending improvement in clinical condition.   Consultants:  Dietitian  Procedures:  none  Antibiotics:  Cipro and flagyl  HPI/Subjective: Patient reports that she feels better.  No acute issues reported overnight.  Nursing reports that patient is tolerating phenergan well and it has been helping control her nausea. No bouts of emesis recently reported.  Objective: Filed Vitals:   06/11/12 1331 06/11/12 2200 06/12/12 0600 06/12/12 0700  BP: 121/75 112/66 114/72   Pulse: 116 104 98   Temp: 98.9 F (37.2 C) 99 F (37.2 C) 100.1 F (37.8 C) 99 F (37.2 C)  TempSrc: Oral Oral Oral Oral  Resp: 20 18 20    Height:      Weight:      SpO2: 97% 94% 92%    No intake or output data in the 24 hours ending 06/12/12 1446 Filed Weights   06/11/12 0530  Weight: 74.39 kg (164 lb)    Exam:   General:  Pt in NAD, Alert and Awake  Cardiovascular: RRR, No MRG  Respiratory: CTA BL,  no wheezes  Abdomen: tenderness near jujunostomy tube, hypoactive bowel sounds, non distended. No erythema or cellulitis  Data Reviewed: Basic Metabolic Panel:  Recent Labs Lab 06/10/12 2015  NA 143  K 3.5  CL 106  CO2 23  GLUCOSE 124*  BUN 12  CREATININE 0.69  CALCIUM 8.4   Liver Function Tests:  Recent Labs Lab 06/10/12 2015  AST 152*  ALT 53*  ALKPHOS 148*  BILITOT 0.6  PROT 6.3  ALBUMIN 2.5*    Recent Labs Lab 06/10/12 2015  LIPASE 41   No results found for this basename: AMMONIA,  in the last 168 hours CBC:  Recent Labs Lab 06/10/12 2015 06/12/12 0937  WBC 10.7* 7.6  NEUTROABS 9.0*  --   HGB 10.5* 8.7*  HCT 32.8* 27.5*  MCV 80.6 81.4  PLT 370 315   Cardiac Enzymes: No results found for this basename: CKTOTAL, CKMB, CKMBINDEX, TROPONINI,  in the last 168 hours BNP (last 3 results) No results found for this basename: PROBNP,  in the last 8760 hours CBG:  Recent Labs Lab 06/11/12 2110 06/11/12 2359 06/12/12 0416 06/12/12 0748 06/12/12 1125  GLUCAP 83 79 86 108* 87    No results found for this or any previous visit (from the past 240 hour(s)).   Studies: Ct Abdomen Pelvis W Contrast  06/11/2012  *RADIOLOGY REPORT*  Clinical Data: Nausea, vomiting, and chills for 1 week.  Lower abdominal pain.  CT ABDOMEN AND PELVIS WITH CONTRAST  Technique:  Multidetector CT imaging of the abdomen and pelvis was performed following the standard protocol during bolus administration of intravenous contrast.  Contrast: OMNIPAQUE IOHEXOL 300 MG/ML  SOLN 100 ml Omnipaque- 300  Comparison: 04/09/2011  Findings: Due to difficulties with contrast injection, there is limited contrast opacification of solid organs.  Fibrosis and atelectasis in the lung bases.  Dilated and contrast filled esophagus.  This could represent dysmotility or distal esophageal obstruction.  Dilatation has increased since the previous study.  Surgical absence of the gallbladder. Mild bile  duct dilatation, likely due to postoperative physiology.  Postoperative changes consistent with gastrectomy.  Left lower quadrant jejunostomy tube. The colon is decompressed, limiting evaluation, but there appears to be thickening of the wall of the transverse colon and cecum which could represent colitis of nonspecific etiology.  There is a small amount of small bowel wall thickening at the jejunostomy tube insertion.  There is no evidence of free air or free fluid in the abdomen to suggest perforation.  Postsurgical changes in the anterior abdominal wall.  No small bowel dilatation.  The liver, spleen, adrenal glands, and retroperitoneal lymph nodes are unremarkable.  Sub centimeter low attenuation changes in the kidneys suggesting parenchymal cysts.  These were demonstrated on a previous study without significant change.  Calcification of the aorta without aneurysm.  Pelvis:  The uterus and ovaries are not enlarged.  The bladder wall is not thickened.  No free or loculated pelvic fluid collections. The appendix is normal.  No diverticulitis.  Scattered diverticula in the colon.  There is infiltration in the subcutaneous fat over the left lower flank region, likely representing contusion.  No significant lymphadenopathy in the pelvis.  Normal alignment of the thoracic vertebrae.  IMPRESSION: There appears to be colonic wall thickening although non distension of the colon limits evaluation.  Suggest colitis.  Focal wall thickening in the jejunum at the insertion of jejunostomy catheter of nonspecific etiology but likely postoperative.  No evidence of bowel obstruction.  The esophagus is distended, suggesting dysmotility and/or distal obstruction.   Original Report Authenticated By: Burman Nieves, M.D.    Dg Abd Acute W/chest  06/10/2012  *RADIOLOGY REPORT*  Clinical Data: Nausea, vomiting, diarrhea, possible obstruction, prior gastrectomy.  ACUTE ABDOMEN SERIES (ABDOMEN 2 VIEW & CHEST 1 VIEW)  Comparison: Chest  x-ray of 10/25/2011 and abdomen films of 04/13/2012  Findings: No active infiltrate or effusion is seen.  Mediastinal contours are stable.  The heart is within upper limits of normal. An old healed right posterior eighth rib fracture is noted.  Supine and erect views of the abdomen show a paucity of bowel gas throughout the abdomen.  No air-fluid level is seen to indicate obstruction.  Fluid-filled bowel cannot be excluded however. A gastrojejunostomy feeding tube is present.  IMPRESSION:  1.  No active lung disease. 2.  Paucity of bowel gas.  No definite obstruction.  Difficult to exclude fluid-filled bowel.   Original Report Authenticated By: Dwyane Dee, M.D.     Scheduled Meds: . ciprofloxacin  400 mg Intravenous Q12H  . enoxaparin (LOVENOX) injection  40 mg Subcutaneous Q24H  . [START ON 06/13/2012] influenza  inactive virus vaccine  0.5 mL Intramuscular Tomorrow-1000  . insulin aspart  0-15 Units Subcutaneous Q4H  . metoCLOPramide  5 mg Oral TID AC & HS  . metronidazole  500 mg Intravenous Q8H  . promethazine  12.5 mg Intravenous Once  . sodium chloride  1,000 mL Intravenous Once   Continuous Infusions: .  dextrose 5 % and 0.45 % NaCl with KCl 20 mEq/L 100 mL/hr at 06/12/12 1324    Active Problems:   DM, UNCOMPLICATED, TYPE II   Gastroparesis   Irritable bowel syndrome   Nausea with vomiting   Abdominal pain, epigastric   Dehydration, moderate    Time spent: > 35 minutes    Penny Pia  [facility] Pager [phone]. If 8PM-8AM, please contact night-coverage at [email or web address], password [facility] 06/12/2012, 2:46 PM  LOS: 2 days

## 2012-06-13 DIAGNOSIS — I10 Essential (primary) hypertension: Secondary | ICD-10-CM

## 2012-06-13 LAB — GLUCOSE, CAPILLARY
Glucose-Capillary: 103 mg/dL — ABNORMAL HIGH (ref 70–99)
Glucose-Capillary: 107 mg/dL — ABNORMAL HIGH (ref 70–99)
Glucose-Capillary: 84 mg/dL (ref 70–99)
Glucose-Capillary: 89 mg/dL (ref 70–99)
Glucose-Capillary: 99 mg/dL (ref 70–99)

## 2012-06-13 MED ORDER — OSMOLITE 1.2 CAL PO LIQD
1000.0000 mL | ORAL | Status: DC
  Administered 2012-06-13 – 2012-06-16 (×4): 1000 mL
  Filled 2012-06-13 (×12): qty 1000
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
## 2012-06-13 NOTE — Progress Notes (Signed)
[facility] PROGRESS NOTE  Emily [name] AVW:[id number] DOB: 1940-11-19 DOA: 06/10/2012 PCP: Georganna Skeans, MD  Assessment/Plan: 1. Colitis - Will plan on continuing IV antibiotics cipro and flagyl - Patient improved on antibiotics, nausea and abdominal discomfort improved.  - Ct of abdomen and pelvis reported colonic wall thickening. - keep patient npo.  Patient gets nutrition via enteral feeds.  Given improvement in condition will plan on consulting dietitian to start planning regarding commencement of enteral tube feeds which will occur tonight 06/13/12. - maintain patient on IVF's while patient is npo - WBC within normal limits.  2. Nausea and vomiting - most likely 2ary to # 1 and currently improved - continue supportive care - antiemetics  3. DM  - Insulin coverage - monitor blood sugars.  4. Dehydration  - likely from 1 and 2 - continue MIVF's  - plan on initiating enteral tube feeds with continued improvement in condition.   Code Status: full Family Communication: no family at bedside. Disposition Plan: If patient tolerates enteral tube feeds will likely d/c next am.   Consultants:  Dietitian  Procedures:  none  Antibiotics:  Cipro and flagyl  HPI/Subjective: Patient reports that she feels better.  No acute issues reported overnight.  No new complaints.  Objective: Filed Vitals:   06/12/12 1604 06/12/12 2330 06/13/12 0444 06/13/12 1424  BP: 112/47 111/70 115/71 126/82  Pulse: 96 93 90 98  Temp:  98.2 F (36.8 C) 98.3 F (36.8 C) 98.2 F (36.8 C)  TempSrc:  Oral Oral Oral  Resp:  18 20 18   Height:      Weight:      SpO2:  98% 94% 97%    Intake/Output Summary (Last 24 hours) at 06/13/12 1532 Last data filed at 06/12/12 2032  Gross per 24 hour  Intake      0 ml  Output      1 ml  Net     -1 ml   Filed Weights   06/11/12 0530  Weight: 74.39 kg (164 lb)    Exam:   General:  Pt in NAD, Alert and Awake  Cardiovascular: RRR, No  MRG  Respiratory: CTA BL, no wheezes  Abdomen: tenderness near jujunostomy tube, hypoactive bowel sounds, non distended. No erythema or cellulitis  Data Reviewed: Basic Metabolic Panel:  Recent Labs Lab 06/10/12 2015  NA 143  K 3.5  CL 106  CO2 23  GLUCOSE 124*  BUN 12  CREATININE 0.69  CALCIUM 8.4   Liver Function Tests:  Recent Labs Lab 06/10/12 2015  AST 152*  ALT 53*  ALKPHOS 148*  BILITOT 0.6  PROT 6.3  ALBUMIN 2.5*    Recent Labs Lab 06/10/12 2015  LIPASE 41   No results found for this basename: AMMONIA,  in the last 168 hours CBC:  Recent Labs Lab 06/10/12 2015 06/12/12 0937  WBC 10.7* 7.6  NEUTROABS 9.0*  --   HGB 10.5* 8.7*  HCT 32.8* 27.5*  MCV 80.6 81.4  PLT 370 315   Cardiac Enzymes: No results found for this basename: CKTOTAL, CKMB, CKMBINDEX, TROPONINI,  in the last 168 hours BNP (last 3 results) No results found for this basename: PROBNP,  in the last 8760 hours CBG:  Recent Labs Lab 06/12/12 2016 06/13/12 0021 06/13/12 0349 06/13/12 0747 06/13/12 1123  GLUCAP 96 89 103* 84 99    No results found for this or any previous visit (from the past 240 hour(s)).   Studies: No results found.  Scheduled Meds: .  ciprofloxacin  400 mg Intravenous Q12H  . enoxaparin (LOVENOX) injection  40 mg Subcutaneous Q24H  . insulin aspart  0-15 Units Subcutaneous Q4H  . metoCLOPramide  5 mg Oral TID AC & HS  . metronidazole  500 mg Intravenous Q8H  . promethazine  12.5 mg Intravenous Once  . sodium chloride  1,000 mL Intravenous Once   Continuous Infusions: . dextrose 5 % and 0.45 % NaCl with KCl 20 mEq/L 100 mL/hr at 06/13/12 1002  . feeding supplement (OSMOLITE 1.2 CAL)      Active Problems:   DM, UNCOMPLICATED, TYPE II   Gastroparesis   Irritable bowel syndrome   Nausea with vomiting   Abdominal pain, epigastric   Dehydration, moderate    Time spent: > 35 minutes    Penny Pia  [facility] Pager [phone]. If  8PM-8AM, please contact night-coverage at [email or web address], password [facility] 06/13/2012, 3:32 PM  LOS: 3 days

## 2012-06-14 LAB — GLUCOSE, CAPILLARY
Glucose-Capillary: 100 mg/dL — ABNORMAL HIGH (ref 70–99)
Glucose-Capillary: 113 mg/dL — ABNORMAL HIGH (ref 70–99)
Glucose-Capillary: 122 mg/dL — ABNORMAL HIGH (ref 70–99)
Glucose-Capillary: 144 mg/dL — ABNORMAL HIGH (ref 70–99)
Glucose-Capillary: 157 mg/dL — ABNORMAL HIGH (ref 70–99)
Glucose-Capillary: 161 mg/dL — ABNORMAL HIGH (ref 70–99)
Glucose-Capillary: 86 mg/dL (ref 70–99)
Glucose-Capillary: 88 mg/dL (ref 70–99)

## 2012-06-14 NOTE — Progress Notes (Signed)
Nutrition Brief Note  TF: Osmolite 1.2 via jejunostomy at 140ml/hr nightly from 10pm-6am (8 hours)  Met with pt who reported tolerating TF well last night without any problems, no nausea or stomach pain/cramping. RN reports night RN reported pt tolerated TF as well. Pt currently without any nutritional needs.  Levon Hedger MS, RD, LDN [phone] Pager [phone] After Hours Pager

## 2012-06-14 NOTE — Progress Notes (Signed)
[facility] PROGRESS NOTE  Emily [name] ZOX:[id number] DOB: [birth year] DOA: 06/10/2012 PCP: Georganna Skeans, MD  Assessment/Plan: 1. Colitis - Will plan on continuing IV antibiotics cipro and flagyl.   - Patient improved on antibiotics, nausea and abdominal discomfort improved.  But still reports nausea and one bout of emesis earlier today 06/14/12  - Ct of abdomen and pelvis reported colonic wall thickening. - keep patient npo.  Patient gets nutrition via enteral feeds.  Given improvement in condition will plan on consulting dietitian to start planning regarding commencement of enteral tube feeds which will occur tonight 06/13/12.  Reportedly patient tolerated feeds last night. - Had MIVF's most of the day will discontinue fluids and reassess patient. Patient to continue enteral tube feeds tonight.   - WBC within normal limits.  2. Nausea and vomiting - most likely 2ary to # 1 and currently improved - continue supportive care - antiemetics  3. DM  - Insulin coverage - monitor blood sugars.  4. Dehydration  - likely from 1 and 2 - improved with IVF's and commencement of enteral tube feeds - check BMP next am to assess BUN/creatinine ratio   Code Status: full Family Communication: no family at bedside. Disposition Plan: With continue improvement may consider d/c in 1-2 days.  Patient with bout of emesis today 06/14/12 as such will reassess next am.   Consultants:  Dietitian  Procedures:  none  Antibiotics:  Cipro and flagyl  HPI/Subjective: Patient reports one bout of emesis this am.  No new complaints.  No acute issues overnight.    Objective: Filed Vitals:   06/13/12 1424 06/13/12 2200 06/14/12 0545 06/14/12 1215  BP: 126/82 122/76 127/72 112/73  Pulse: 98 96 108 99  Temp: 98.2 F (36.8 C) 98.4 F (36.9 C) 98.7 F (37.1 C) 98.6 F (37 C)  TempSrc: Oral Oral Oral Oral  Resp: 18 20 20 19   Height:      Weight:      SpO2: 97% 96% 97% 95%     Intake/Output Summary (Last 24 hours) at 06/14/12 1727 Last data filed at 06/14/12 1523  Gross per 24 hour  Intake 4998.33 ml  Output      0 ml  Net 4998.33 ml   Filed Weights   06/11/12 0530  Weight: 74.39 kg (164 lb)    Exam:   General:  Pt in NAD, Alert and Awake  Cardiovascular: RRR, No MRG  Respiratory: CTA BL, no wheezes  Abdomen: tenderness near jujunostomy tube, hypoactive bowel sounds, non distended. No erythema or cellulitis  Data Reviewed: Basic Metabolic Panel:  Recent Labs Lab 06/10/12 2015  NA 143  K 3.5  CL 106  CO2 23  GLUCOSE 124*  BUN 12  CREATININE 0.69  CALCIUM 8.4   Liver Function Tests:  Recent Labs Lab 06/10/12 2015  AST 152*  ALT 53*  ALKPHOS 148*  BILITOT 0.6  PROT 6.3  ALBUMIN 2.5*    Recent Labs Lab 06/10/12 2015  LIPASE 41   No results found for this basename: AMMONIA,  in the last 168 hours CBC:  Recent Labs Lab 06/10/12 2015 06/12/12 0937  WBC 10.7* 7.6  NEUTROABS 9.0*  --   HGB 10.5* 8.7*  HCT 32.8* 27.5*  MCV 80.6 81.4  PLT 370 315   Cardiac Enzymes: No results found for this basename: CKTOTAL, CKMB, CKMBINDEX, TROPONINI,  in the last 168 hours BNP (last 3 results) No results found for this basename: PROBNP,  in the last 8760 hours CBG:  Recent Labs Lab 06/14/12 0014 06/14/12 0400 06/14/12 0734 06/14/12 1217 06/14/12 1621  GLUCAP 161* 144* 86 113* 88    No results found for this or any previous visit (from the past 240 hour(s)).   Studies: No results found.  Scheduled Meds: . ciprofloxacin  400 mg Intravenous Q12H  . enoxaparin (LOVENOX) injection  40 mg Subcutaneous Q24H  . insulin aspart  0-15 Units Subcutaneous Q4H  . metoCLOPramide  5 mg Oral TID AC & HS  . metronidazole  500 mg Intravenous Q8H  . promethazine  12.5 mg Intravenous Once  . sodium chloride  1,000 mL Intravenous Once   Continuous Infusions: . dextrose 5 % and 0.45 % NaCl with KCl 20 mEq/L 50 mL/hr at 06/14/12  1523  . feeding supplement (OSMOLITE 1.2 CAL) 1,000 mL (06/13/12 2204)    Active Problems:   DM, UNCOMPLICATED, TYPE II   Gastroparesis   Irritable bowel syndrome   Nausea with vomiting   Abdominal pain, epigastric   Dehydration, moderate    Time spent: > 35 minutes    Penny Pia  [facility] Pager [phone]. If 8PM-8AM, please contact night-coverage at [email or web address], password [facility] 06/14/2012, 5:27 PM  LOS: 4 days

## 2012-06-14 NOTE — Progress Notes (Signed)
Pt tolerated Osmolite feeding, started at 60 ml/hr.

## 2012-06-15 ENCOUNTER — Inpatient Hospital Stay (HOSPITAL_COMMUNITY): Payer: Medicare Other

## 2012-06-15 DIAGNOSIS — D649 Anemia, unspecified: Secondary | ICD-10-CM | POA: Diagnosis present

## 2012-06-15 DIAGNOSIS — K5289 Other specified noninfective gastroenteritis and colitis: Principal | ICD-10-CM | POA: Diagnosis present

## 2012-06-15 DIAGNOSIS — R110 Nausea: Secondary | ICD-10-CM

## 2012-06-15 LAB — CBC
HCT: 31.1 % — ABNORMAL LOW (ref 36.0–46.0)
Hemoglobin: 9.6 g/dL — ABNORMAL LOW (ref 12.0–15.0)
MCH: 25.3 pg — ABNORMAL LOW (ref 26.0–34.0)
MCHC: 30.9 g/dL (ref 30.0–36.0)
MCV: 82.1 fL (ref 78.0–100.0)
Platelets: 343 10*3/uL (ref 150–400)
RBC: 3.79 MIL/uL — ABNORMAL LOW (ref 3.87–5.11)
RDW: 14.8 % (ref 11.5–15.5)
WBC: 8.2 10*3/uL (ref 4.0–10.5)

## 2012-06-15 LAB — BASIC METABOLIC PANEL
BUN: 5 mg/dL — ABNORMAL LOW (ref 6–23)
CO2: 21 mEq/L (ref 19–32)
Calcium: 7.4 mg/dL — ABNORMAL LOW (ref 8.4–10.5)
Chloride: 107 mEq/L (ref 96–112)
Creatinine, Ser: 0.58 mg/dL (ref 0.50–1.10)
GFR calc Af Amer: >90 mL/min (ref >90)
GFR calc non Af Amer: >90 mL/min (ref >90)
Glucose, Bld: 133 mg/dL — ABNORMAL HIGH (ref 70–99)
Potassium: 3.4 mEq/L — ABNORMAL LOW (ref 3.5–5.1)
Sodium: 137 mEq/L (ref 135–145)

## 2012-06-15 LAB — GLUCOSE, CAPILLARY
Glucose-Capillary: 112 mg/dL — ABNORMAL HIGH (ref 70–99)
Glucose-Capillary: 128 mg/dL — ABNORMAL HIGH (ref 70–99)
Glucose-Capillary: 71 mg/dL (ref 70–99)
Glucose-Capillary: 92 mg/dL (ref 70–99)
Glucose-Capillary: 92 mg/dL (ref 70–99)
Glucose-Capillary: 98 mg/dL (ref 70–99)

## 2012-06-15 MED ORDER — PROMETHAZINE HCL 25 MG/ML IJ SOLN
25.0000 mg | INTRAMUSCULAR | Status: DC | PRN
  Administered 2012-06-15 – 2012-06-17 (×9): 25 mg via INTRAVENOUS
  Filled 2012-06-15 (×7): qty 1

## 2012-06-15 MED ORDER — PANTOPRAZOLE SODIUM 40 MG IV SOLR
40.0000 mg | Freq: Two times a day (BID) | INTRAVENOUS | Status: DC
  Administered 2012-06-15 – 2012-06-17 (×4): 40 mg via INTRAVENOUS
  Filled 2012-06-15 (×5): qty 40

## 2012-06-15 MED ORDER — POTASSIUM CHLORIDE 20 MEQ/15ML (10%) PO LIQD
40.0000 meq | Freq: Once | ORAL | Status: AC
  Administered 2012-06-15: 40 meq
  Filled 2012-06-15: qty 30

## 2012-06-15 MED ORDER — ONDANSETRON HCL 4 MG/2ML IJ SOLN
4.0000 mg | Freq: Four times a day (QID) | INTRAMUSCULAR | Status: DC
  Administered 2012-06-15 – 2012-06-17 (×8): 4 mg via INTRAVENOUS
  Filled 2012-06-15 (×8): qty 2

## 2012-06-15 MED ORDER — IOHEXOL 300 MG/ML  SOLN
50.0000 mL | Freq: Once | INTRAMUSCULAR | Status: AC | PRN
  Administered 2012-06-15: 50 mL

## 2012-06-15 MED ORDER — METOCLOPRAMIDE HCL 5 MG/ML IJ SOLN
5.0000 mg | Freq: Three times a day (TID) | INTRAMUSCULAR | Status: DC
  Administered 2012-06-15 (×2): 5 mg via INTRAVENOUS
  Administered 2012-06-16: 13:00:00 via INTRAVENOUS
  Administered 2012-06-16: 5 mg via INTRAVENOUS
  Administered 2012-06-16: 21:00:00 via INTRAVENOUS
  Administered 2012-06-17 (×2): 5 mg via INTRAVENOUS
  Filled 2012-06-15: qty 2
  Filled 2012-06-15 (×2): qty 1
  Filled 2012-06-15 (×2): qty 2
  Filled 2012-06-15 (×4): qty 1
  Filled 2012-06-15 (×2): qty 2

## 2012-06-15 MED ORDER — METOCLOPRAMIDE HCL 10 MG PO TABS: 5.0000 mg | ORAL_TABLET | Freq: Four times a day (QID) | ORAL | Status: DC | PRN
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
## 2012-06-15 NOTE — Progress Notes (Signed)
Patient ID: Emily [name], female   DOB: 09-11-1940, 72 y.o.   MRN: [id number]   Request has been made to evaluate Jejunostomy tube- "leaking" per RN  Initial evaluation has bumper of tubing approx 2 inches from abdomen. Bumper was replaced flush to skin Then injected with 10 cc saline.  Definite leak from crack in tubing was identified.  Now scheduled for exchange Pt aware and agreeable Consent signed and in chart

## 2012-06-15 NOTE — Progress Notes (Signed)
Harriet Butte, PT [phone]

## 2012-06-15 NOTE — Progress Notes (Signed)
OT Cancellation Note  Patient Details Name: Emily [name] MRN: [id number] DOB: Oct 24, 1940   Cancelled Treatment:    Reason Eval/Treat Not Completed: Medical issues which prohibited therapy (noted pt has leak and will have j-tube exchange. Will check tomorrow.   )  Cyndy Braver 06/15/2012, 1:09 PM Marica Otter, OTR/L [phone] 06/15/2012

## 2012-06-16 DIAGNOSIS — R1319 Other dysphagia: Secondary | ICD-10-CM

## 2012-06-16 LAB — GLUCOSE, CAPILLARY
Glucose-Capillary: 140 mg/dL — ABNORMAL HIGH (ref 70–99)
Glucose-Capillary: 57 mg/dL — ABNORMAL LOW (ref 70–99)
Glucose-Capillary: 77 mg/dL (ref 70–99)
Glucose-Capillary: 78 mg/dL (ref 70–99)
Glucose-Capillary: 83 mg/dL (ref 70–99)
Glucose-Capillary: 83 mg/dL (ref 70–99)
Glucose-Capillary: 96 mg/dL (ref 70–99)

## 2012-06-16 LAB — COMPREHENSIVE METABOLIC PANEL
ALT: 47 U/L — ABNORMAL HIGH (ref 0–35)
AST: 55 U/L — ABNORMAL HIGH (ref 0–37)
Albumin: 2.4 g/dL — ABNORMAL LOW (ref 3.5–5.2)
Alkaline Phosphatase: 101 U/L (ref 39–117)
BUN: 4 mg/dL — ABNORMAL LOW (ref 6–23)
CO2: 22 mEq/L (ref 19–32)
Calcium: 7.8 mg/dL — ABNORMAL LOW (ref 8.4–10.5)
Chloride: 109 mEq/L (ref 96–112)
Creatinine, Ser: 0.58 mg/dL (ref 0.50–1.10)
GFR calc Af Amer: >90 mL/min (ref >90)
GFR calc non Af Amer: >90 mL/min (ref >90)
Glucose, Bld: 122 mg/dL — ABNORMAL HIGH (ref 70–99)
Potassium: 3.7 mEq/L (ref 3.5–5.1)
Sodium: 138 mEq/L (ref 135–145)
Total Bilirubin: 0.2 mg/dL — ABNORMAL LOW (ref 0.3–1.2)
Total Protein: 5.8 g/dL — ABNORMAL LOW (ref 6.0–8.3)

## 2012-06-16 LAB — CBC
HCT: 32.4 % — ABNORMAL LOW (ref 36.0–46.0)
Hemoglobin: 10.2 g/dL — ABNORMAL LOW (ref 12.0–15.0)
MCH: 25.8 pg — ABNORMAL LOW (ref 26.0–34.0)
MCHC: 31.5 g/dL (ref 30.0–36.0)
MCV: 81.8 fL (ref 78.0–100.0)
Platelets: 325 10*3/uL (ref 150–400)
RBC: 3.96 MIL/uL (ref 3.87–5.11)
RDW: 15.1 % (ref 11.5–15.5)
WBC: 7.6 10*3/uL (ref 4.0–10.5)

## 2012-06-16 LAB — HEMOGLOBIN A1C
Hgb A1c MFr Bld: 5.7 % — ABNORMAL HIGH (ref <5.7)
Mean Plasma Glucose: 117 mg/dL — ABNORMAL HIGH (ref <117)

## 2012-06-16 MED ORDER — INSULIN ASPART 100 UNIT/ML ~~LOC~~ SOLN: 0.0000 [IU] | SUBCUTANEOUS | Status: DC

## 2012-06-16 MED ORDER — POTASSIUM CHLORIDE 20 MEQ/15ML (10%) PO LIQD
40.0000 meq | Freq: Once | ORAL | Status: AC
  Administered 2012-06-16: 40 meq
  Filled 2012-06-16: qty 30

## 2012-06-16 MED ORDER — FUROSEMIDE 10 MG/ML IJ SOLN
40.0000 mg | Freq: Once | INTRAMUSCULAR | Status: AC
  Administered 2012-06-16: 40 mg via INTRAVENOUS
  Filled 2012-06-16: qty 4
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
## 2012-06-16 NOTE — Evaluation (Signed)
Physical Therapy Evaluation Patient Details Name: Rafeef Lau MRN: [id number] DOB: 1940-06-07 Today's Date: 06/16/2012 Time: 5621-3086 PT Time Calculation (min): 30 min  PT Assessment / Plan / Recommendation Clinical Impression  72 y.o. female admitted with colitis, J tube replaced 06/15/12. Pt requiring min A for mobility, expect she will not need f/u PT. She would benefit from acute PT to maximize safety and independence with mobility.     PT Assessment  Patient needs continued PT services    Follow Up Recommendations  Home health PT;No PT follow up    Does the patient have the potential to tolerate intense rehabilitation      Barriers to Discharge None      Equipment Recommendations  None recommended by PT    Recommendations for Other Services     Frequency Min 3X/week    Precautions / Restrictions Precautions Precautions: None Restrictions Weight Bearing Restrictions: No   Pertinent Vitals/Pain *8/10 R groin pain at rest 0/10 with walking, premedicated/RN aware**      Mobility  Bed Mobility Bed Mobility: Supine to Sit;Sit to Supine Supine to Sit: 4: Min assist Sit to Supine: 4: Min assist Details for Bed Mobility Assistance: min A for RLE in/out of bed Transfers Transfers: Sit to Stand;Stand to Sit Sit to Stand: 4: Min guard;From bed Stand to Sit: 4: Min guard;To bed Details for Transfer Assistance: cues for hand placement; pt is not used to using walker Ambulation/Gait Ambulation/Gait Assistance: 4: Min assist Ambulation Distance (Feet): 300 Feet Assistive device: 1 person hand held assist Ambulation/Gait Assistance Details: one seated rest break 2* fatigue and HR up to 127 Gait Pattern: Within Functional Limits Gait velocity: WFL General Gait Details: min HHA for balance    Exercises     PT Diagnosis: Generalized weakness  PT Problem List: Decreased activity tolerance;Pain PT Treatment Interventions: Gait training;Functional mobility  training;Patient/family education   PT Goals Acute Rehab PT Goals PT Goal Formulation: With patient Time For Goal Achievement: 06/30/12 Potential to Achieve Goals: Good Pt will go Supine/Side to Sit: Independently PT Goal: Supine/Side to Sit - Progress: Goal set today Pt will go Sit to Stand: Independently PT Goal: Sit to Stand - Progress: Goal set today Pt will Ambulate: >150 feet;with modified independence;with least restrictive assistive device PT Goal: Ambulate - Progress: Goal set today Pt will Go Up / Down Stairs: 1-2 stairs;with min assist PT Goal: Up/Down Stairs - Progress: Goal set today Pt will Perform Home Exercise Program: Independently PT Goal: Perform Home Exercise Program - Progress: Goal set today  Visit Information  Last PT Received On: 06/16/12 Assistance Needed: +1    Subjective Data  Subjective: My R leg hurts.  Patient Stated Goal: to go home   Prior Functioning  Home Living Lives With: Alone Available Help at Discharge: Family Type of Home: House Home Access: Stairs to enter Secretary/administrator of Steps: 3 Entrance Stairs-Rails: Left Home Layout: One level Bathroom Shower/Tub: Engineer, manufacturing systems: Standard Home Adaptive Equipment: Shower chair with back;Bedside commode/3-in-1;Straight cane Additional Comments: godson helps during day, gives medicine and cleans Prior Function Level of Independence: Independent Driving: No Communication Communication: No difficulties Dominant Hand: Right    Cognition  Cognition Overall Cognitive Status: Appears within functional limits for tasks assessed/performed Arousal/Alertness: Awake/alert Orientation Level: Appears intact for tasks assessed Behavior During Session: [facility] for tasks performed    Extremity/Trunk Assessment Right Upper Extremity Assessment RUE ROM/Strength/Tone: [facility] for tasks assessed (bil UEs tight at shoulders but wfls--extra time to move)  Left Upper Extremity Assessment LUE  ROM/Strength/Tone: WFL for tasks assessed Right Lower Extremity Assessment RLE ROM/Strength/Tone: Deficits;Due to pain RLE ROM/Strength/Tone Deficits: R knee ext -30*, limited by groin pain RLE Sensation: WFL - Light Touch RLE Coordination: WFL - gross/fine motor Left Lower Extremity Assessment LLE ROM/Strength/Tone: Within functional levels LLE Sensation: WFL - Light Touch LLE Coordination: WFL - gross/fine motor Trunk Assessment Trunk Assessment: Normal   Balance Balance Balance Assessed: Yes Static Sitting Balance Static Sitting - Balance Support: Feet supported;No upper extremity supported Static Sitting - Level of Assistance: 7: Independent Static Sitting - Comment/# of Minutes: 2  End of Session PT - End of Session Equipment Utilized During Treatment: Gait belt Activity Tolerance: Patient tolerated treatment well;Patient limited by fatigue Patient left: in bed Nurse Communication: Mobility status  GP     Ralene Bathe Kistler 06/16/2012, 11:30 AM [phone]

## 2012-06-16 NOTE — Progress Notes (Signed)
[facility] Progress Note  SUBJECTIVE: about same as yesterday. Vomiting once after ambulating today. Still having small volume watery stools, mainly at time of urination  OBJECTIVE:  Vital signs in last 24 hours: Temp:  [97.9 F (36.6 C)-98.5 F (36.9 C)] 98.3 F (36.8 C) (02/13 0514) Pulse Rate:  [95-98] 97 (02/13 0514) Resp:  [16-18] 16 (02/13 0514) BP: (104-124)/(50-76) 104/50 mmHg (02/13 0514) SpO2:  [97 %-100 %] 98 % (02/13 0514) Last BM Date: 06/15/12 General:    black female in NAD Heart:  Regular rate and rhythm; no murmurs Lungs: Respirations even and unlabored, lungs CTA bilaterally Abdomen:  Soft, nondistended, moderate left mid abdominal tenderness. Normal bowel sounds. Extremities:  Without edema. Neurologic:  Alert and oriented,  grossly normal neurologically. Psych:  Cooperative. Normal mood and affect.    Lab Results:  Recent Labs  06/15/12 0525 06/16/12 0525  WBC 8.2 7.6  HGB 9.6* 10.2*  HCT 31.1* 32.4*  PLT 343 325   BMET  Recent Labs  06/15/12 0525 06/16/12 0525  NA 137 138  K 3.4* 3.7  CL 107 109  CO2 21 22  GLUCOSE 133* 122*  BUN 5* 4*  CREATININE 0.58 0.58  CALCIUM 7.4* 7.8*   LFT  Recent Labs  06/16/12 0525  PROT 5.8*  ALBUMIN 2.4*  AST 55*  ALT 47*  ALKPHOS 101  BILITOT 0.2*    Studies/Results: Ir Replc Duoden/jejuno Tube Percut W/fluoro  06/15/2012  *RADIOLOGY REPORT*  Indication: Leaking jejunostomy tube  FLUROSCOPIC GUIDED REPLACEMENT OF JEJUNOSTOMY TUBE  Comparison: Fluoroscopic-guided jejunostomy tube exchange - 02/15/2012; 02/09/2013; 12/30/2012  Medications: None  Contrast: 50 ml Omnipaque-300, administered enterically  Fluoroscopy Time: 1.2 minutes.  Complications: None immediate  Technique / Findings:  Informed written consent was obtained from the patient after a discussion of the risks, benefits and alternatives to treatment. Questions regarding the procedure were encouraged and answered.  A timeout was  performed prior to the initiation of the procedure.  The external portion of the existing jejunostomy tube was prepped and draped in the usual sterile fashion, and a sterile drape was applied covering the operative field.  Maximum barrier sterile technique with sterile gowns and gloves were used for the procedure.  A timeout was performed prior to the initiation of the procedure.  Contrast injection of the existing jejunostomy tube confirmed appropriate intraluminal positioning. The basilar portion of existing jejunostomy tube was cut and cannulated with a stiff Glidewire which were advanced into the small bowel.  Under intermittent fluoroscopic guidance, the catheter was exchanged for a new 20-French jejunostomy tube.  The retention balloon was inflated and the balloon was cinched.  Contrast was injected and several spot fluoroscopic images were obtained in various obliquities confirming intraluminal postioning.  The patient tolerated the procedure well without immediate postprocedural complication.  Impression:  Successful fluoroscopic guided replacement of a new 20-French jejunostomy tube.  The new jejunostomy tube is ready for immediate use.   Original Report Authenticated By: Tacey Ruiz, MD      ASSESSMENT / PLAN:  72 year old female with history of subtotal gastrectomy with Roux-en-y gastrojejunostomy [facility]) two years ago. Her J tube was changed out by IR yesterday. Her pain doesn't seem to be significant though she is getting frequent morphine. She points to diffuse lower abdomen but on exam she is tender in left mid abdomen only. Still having some small volume watery stools, mainly when urinating. Doubt this is C-diff. We made changes to anti-emetics yesterday and added  PPI. She has since only vomited once and that was this am after walking. Emesis small volume.  She is getting tube feeds at night. Hopefully patient can be discharged tomorrow.     LOS: 6 days   Willette Cluster  06/16/2012, 9:56  AM   GI ATTENDING  Interval history and labs reviewed. Patient personally seen and examined. Agree with assessment as outlined above. She appears to be at her baseline. Exam is benign. Agree with anticipated discharge tomorrow. Will sign off.  Wilhemina Bonito. Eda Keys., M.D. [facility]

## 2012-06-17 ENCOUNTER — Inpatient Hospital Stay (HOSPITAL_COMMUNITY): Payer: Medicare Other

## 2012-06-17 DIAGNOSIS — M1990 Unspecified osteoarthritis, unspecified site: Secondary | ICD-10-CM

## 2012-06-17 DIAGNOSIS — M25559 Pain in unspecified hip: Secondary | ICD-10-CM

## 2012-06-17 DIAGNOSIS — M25551 Pain in right hip: Secondary | ICD-10-CM | POA: Diagnosis present

## 2012-06-17 LAB — CBC
HCT: 32.2 % — ABNORMAL LOW (ref 36.0–46.0)
Hemoglobin: 10.1 g/dL — ABNORMAL LOW (ref 12.0–15.0)
MCH: 25.6 pg — ABNORMAL LOW (ref 26.0–34.0)
MCHC: 31.4 g/dL (ref 30.0–36.0)
MCV: 81.5 fL (ref 78.0–100.0)
Platelets: 288 10*3/uL (ref 150–400)
RBC: 3.95 MIL/uL (ref 3.87–5.11)
RDW: 15 % (ref 11.5–15.5)
WBC: 9.7 10*3/uL (ref 4.0–10.5)

## 2012-06-17 LAB — COMPREHENSIVE METABOLIC PANEL
ALT: 35 U/L (ref 0–35)
AST: 35 U/L (ref 0–37)
Albumin: 2.2 g/dL — ABNORMAL LOW (ref 3.5–5.2)
Alkaline Phosphatase: 100 U/L (ref 39–117)
BUN: 5 mg/dL — ABNORMAL LOW (ref 6–23)
CO2: 23 mEq/L (ref 19–32)
Calcium: 7.8 mg/dL — ABNORMAL LOW (ref 8.4–10.5)
Chloride: 107 mEq/L (ref 96–112)
Creatinine, Ser: 0.68 mg/dL (ref 0.50–1.10)
GFR calc Af Amer: >90 mL/min (ref >90)
GFR calc non Af Amer: 86 mL/min — ABNORMAL LOW (ref >90)
Glucose, Bld: 146 mg/dL — ABNORMAL HIGH (ref 70–99)
Potassium: 4.2 mEq/L (ref 3.5–5.1)
Sodium: 139 mEq/L (ref 135–145)
Total Bilirubin: 0.2 mg/dL — ABNORMAL LOW (ref 0.3–1.2)
Total Protein: 5.7 g/dL — ABNORMAL LOW (ref 6.0–8.3)

## 2012-06-17 LAB — GLUCOSE, CAPILLARY
Glucose-Capillary: 132 mg/dL — ABNORMAL HIGH (ref 70–99)
Glucose-Capillary: 107 mg/dL — ABNORMAL HIGH (ref 70–99)

## 2012-06-17 MED ORDER — OMEPRAZOLE 20 MG PO CPDR: 20.0000 mg | DELAYED_RELEASE_CAPSULE | Freq: Two times a day (BID) | ORAL | Status: DC

## 2012-06-17 NOTE — Progress Notes (Signed)
Home Health PT/OT services set up with [facility].

## 2012-06-17 NOTE — Discharge Summary (Signed)
[facility]  Physician Discharge Summary   Patient ID: Emily [name] MRN: [id number] DOB/AGE: Oct 30, 1940 72 y.o.  Admit date: 06/10/2012 Discharge date: 06/17/2012  PCP: Georganna Skeans, MD  DISCHARGE DIAGNOSES:  Principal Problem:   Other and unspecified noninfectious gastroenteritis and colitis Active Problems:   DM, UNCOMPLICATED, TYPE II   Gastroparesis   Irritable bowel syndrome   Nausea with vomiting   Abdominal pain, epigastric   Dehydration, moderate   Anemia, unspecified   Right hip pain   RECOMMENDATIONS FOR OUTPATIENT FOLLOW UP: 1. Home health PT/OT  DISCHARGE CONDITION: fair  Diet recommendation: Low Sodium  Filed Weights   06/11/12 0530  Weight: 74.39 kg (164 lb)    INITIAL HISTORY: Emily [name] is an 72 y.o. female with history of failed Nielson's plication, obesity, recurrent nausea and abdominal pain, depressive disorder, DM2, HTN, s/p jujunostomy with nightly tube feeding, lives at home with her daughter, developed nausea and vomiting with abdominal pain around the JT entrance. She does have intermittent nausea and vomiting, and this is not new. She has been followed by Dr Juanda Chance. She stopped her TF 2 days prior to admission, but the pain persisted so she came to the ER. Evaluation in the ER included a normal WBC with no leukocytosis, normal renal Fx tests, and unremarkable electrolytes. She has normal lipase as well. Her KUB showed no obtruction. Abdominal Pelvic CT showed colitis.  Consultations:  [facility]  Procedures:  None  HOSPITAL COURSE:   Colitis  Patient was started on antibiotics. However she did not improve. GI was consulted. They were not impressed with the CT findings and stopped the antibiotics. Patient had colonoscopy recently in Jan which revealed diverticulosis. C diff was ordered but not checked as she was not having diarrhea. Overall she has improved.  Right Hip Pain For the first time today she mentioned pain in the right  groin area. It was present yesterday as well per PT notes. She has limitation with hip flexion. But she was able to ambulate yesterday. X ray was obtained which revealed only degenerative changes. Pain control. Home health PT/OT to be arranged.  Chronic Nausea/Abdominal Pain with Gastroparesis Has complicated history of GI issues. Continue management per GI. On Reglan for gastroparesis and PPI for esophagitis. Transaminitis noted but is improved from before. Most of her issues are chronic per GI. She can follow with Dr. Juanda Chance as needed.  Dysphagia/Leaking J Tube  She was noted to have leakage around her J tube. IR was consulted and the tube was replaced. She gets feeds at night. She can take medications orally.   DM  Patient states she takes ?Metformin at home. Not on home medication list. Was hypoglycemic yesterday. SSI changed to sensitive and no further episodes. HBA1C 5.7.   Dehydration  Improved with IVF's and commencement of enteral tube feeds   Anemia  Hgb stable. No overt bleeding.   If x ray of the hip does not reveal any significant abnormalities, she can go home.  PERTINENT LABS:  The results of significant diagnostics from this hospitalization (including imaging, microbiology, ancillary and laboratory) are listed below for reference.     Labs: Basic Metabolic Panel:  Recent Labs Lab 06/10/12 2015 06/15/12 0525 06/16/12 0525 06/17/12 0515  NA 143 137 138 139  K 3.5 3.4* 3.7 4.2  CL 106 107 109 107  CO2 23 21 22 23   GLUCOSE 124* 133* 122* 146*  BUN 12 5* 4* 5*  CREATININE 0.69 0.58 0.58 0.68  CALCIUM 8.4  7.4* 7.8* 7.8*   Liver Function Tests:  Recent Labs Lab 06/10/12 2015 06/16/12 0525 06/17/12 0515  AST 152* 55* 35  ALT 53* 47* 35  ALKPHOS 148* 101 100  BILITOT 0.6 0.2* 0.2*  PROT 6.3 5.8* 5.7*  ALBUMIN 2.5* 2.4* 2.2*    Recent Labs Lab 06/10/12 2015  LIPASE 41   CBC:  Recent Labs Lab 06/10/12 2015 06/12/12 0937 06/15/12 0525  06/16/12 0525 06/17/12 0515  WBC 10.7* 7.6 8.2 7.6 9.7  NEUTROABS 9.0*  --   --   --   --   HGB 10.5* 8.7* 9.6* 10.2* 10.1*  HCT 32.8* 27.5* 31.1* 32.4* 32.2*  MCV 80.6 81.4 82.1 81.8 81.5  PLT 370 315 343 325 288   CBG:  Recent Labs Lab 06/16/12 1608 06/16/12 2021 06/16/12 2337 06/17/12 0550 06/17/12 0741  GLUCAP 78 77 83 132* 107*     IMAGING STUDIES Ct Abdomen Pelvis W Contrast  06/11/2012  *RADIOLOGY REPORT*  Clinical Data: Nausea, vomiting, and chills for 1 week.  Lower abdominal pain.  CT ABDOMEN AND PELVIS WITH CONTRAST  Technique:  Multidetector CT imaging of the abdomen and pelvis was performed following the standard protocol during bolus administration of intravenous contrast.  Contrast: OMNIPAQUE IOHEXOL 300 MG/ML  SOLN 100 ml Omnipaque- 300  Comparison: 04/09/2011  Findings: Due to difficulties with contrast injection, there is limited contrast opacification of solid organs.  Fibrosis and atelectasis in the lung bases.  Dilated and contrast filled esophagus.  This could represent dysmotility or distal esophageal obstruction.  Dilatation has increased since the previous study.  Surgical absence of the gallbladder. Mild bile duct dilatation, likely due to postoperative physiology.  Postoperative changes consistent with gastrectomy.  Left lower quadrant jejunostomy tube. The colon is decompressed, limiting evaluation, but there appears to be thickening of the wall of the transverse colon and cecum which could represent colitis of nonspecific etiology.  There is a small amount of small bowel wall thickening at the jejunostomy tube insertion.  There is no evidence of free air or free fluid in the abdomen to suggest perforation.  Postsurgical changes in the anterior abdominal wall.  No small bowel dilatation.  The liver, spleen, adrenal glands, and retroperitoneal lymph nodes are unremarkable.  Sub centimeter low attenuation changes in the kidneys suggesting parenchymal cysts.   These were demonstrated on a previous study without significant change.  Calcification of the aorta without aneurysm.  Pelvis:  The uterus and ovaries are not enlarged.  The bladder wall is not thickened.  No free or loculated pelvic fluid collections. The appendix is normal.  No diverticulitis.  Scattered diverticula in the colon.  There is infiltration in the subcutaneous fat over the left lower flank region, likely representing contusion.  No significant lymphadenopathy in the pelvis.  Normal alignment of the thoracic vertebrae.  IMPRESSION: There appears to be colonic wall thickening although non distension of the colon limits evaluation.  Suggest colitis.  Focal wall thickening in the jejunum at the insertion of jejunostomy catheter of nonspecific etiology but likely postoperative.  No evidence of bowel obstruction.  The esophagus is distended, suggesting dysmotility and/or distal obstruction.   Original Report Authenticated By: Burman Nieves, M.D.    Ir Replc Duoden/jejuno Tube Percut W/fluoro  06/15/2012  *RADIOLOGY REPORT*  Indication: Leaking jejunostomy tube  FLUROSCOPIC GUIDED REPLACEMENT OF JEJUNOSTOMY TUBE  Comparison: Fluoroscopic-guided jejunostomy tube exchange - 02/15/2012; 02/09/2013; 12/30/2012  Medications: None  Contrast: 50 ml Omnipaque-300, administered enterically  Fluoroscopy Time:  1.2 minutes.  Complications: None immediate  Technique / Findings:  Informed written consent was obtained from the patient after a discussion of the risks, benefits and alternatives to treatment. Questions regarding the procedure were encouraged and answered.  A timeout was performed prior to the initiation of the procedure.  The external portion of the existing jejunostomy tube was prepped and draped in the usual sterile fashion, and a sterile drape was applied covering the operative field.  Maximum barrier sterile technique with sterile gowns and gloves were used for the procedure.  A timeout was performed  prior to the initiation of the procedure.  Contrast injection of the existing jejunostomy tube confirmed appropriate intraluminal positioning. The basilar portion of existing jejunostomy tube was cut and cannulated with a stiff Glidewire which were advanced into the small bowel.  Under intermittent fluoroscopic guidance, the catheter was exchanged for a new 20-French jejunostomy tube.  The retention balloon was inflated and the balloon was cinched.  Contrast was injected and several spot fluoroscopic images were obtained in various obliquities confirming intraluminal postioning.  The patient tolerated the procedure well without immediate postprocedural complication.  Impression:  Successful fluoroscopic guided replacement of a new 20-French jejunostomy tube.  The new jejunostomy tube is ready for immediate use.   Original Report Authenticated By: Tacey Ruiz, MD    Dg Abd Acute W/chest  06/10/2012  *RADIOLOGY REPORT*  Clinical Data: Nausea, vomiting, diarrhea, possible obstruction, prior gastrectomy.  ACUTE ABDOMEN SERIES (ABDOMEN 2 VIEW & CHEST 1 VIEW)  Comparison: Chest x-ray of 10/25/2011 and abdomen films of 04/13/2012  Findings: No active infiltrate or effusion is seen.  Mediastinal contours are stable.  The heart is within upper limits of normal. An old healed right posterior eighth rib fracture is noted.  Supine and erect views of the abdomen show a paucity of bowel gas throughout the abdomen.  No air-fluid level is seen to indicate obstruction.  Fluid-filled bowel cannot be excluded however. A gastrojejunostomy feeding tube is present.  IMPRESSION:  1.  No active lung disease. 2.  Paucity of bowel gas.  No definite obstruction.  Difficult to exclude fluid-filled bowel.   Original Report Authenticated By: Dwyane Dee, M.D.     DISCHARGE EXAMINATION: Filed Vitals:   06/16/12 0514 06/16/12 1355 06/16/12 2143 06/17/12 0545  BP: 104/50 126/83 145/84 130/76  Pulse: 97 107 94 104  Temp: 98.3 F (36.8 C)  98.3 F (36.8 C) 98 F (36.7 C) 97.5 F (36.4 C)  TempSrc: Oral Oral Oral Oral  Resp: 16 16 16 16   Height:      Weight:      SpO2: 98% 99% 92% 90%   General appearance: alert, cooperative, appears stated age and no distress Head: Normocephalic, without obvious abnormality, atraumatic Resp: clear to auscultation bilaterally Cardio: regular rate and rhythm, S1, S2 normal, no murmur, click, rub or gallop GI: soft, non-tender; bowel sounds normal; no masses,  no organomegaly Extremities: restricted right hip flexion and rotation. tender with movement. Neurologic: A&O x 3. No focal deficits.  DISPOSITION: Home   Current Discharge Medication List    START taking these medications   Details  omeprazole (PRILOSEC) 20 MG capsule Take 1 capsule (20 mg total) by mouth 2 (two) times daily. Qty: 60 capsule, Refills: 2      CONTINUE these medications which have NOT CHANGED   Details  acetaminophen (TYLENOL) 500 MG tablet Take 1,000 mg by mouth every 4 (four) hours as needed. Pain    HYDROcodone-acetaminophen (  VICODIN) 5-500 MG per tablet Take 1 tablet by mouth every 6 (six) hours as needed. Pain    LORazepam (ATIVAN) 0.5 MG tablet Take 0.5 mg by mouth every 8 (eight) hours as needed. Take 1 tablet by mouth three times daily as needed.    metoCLOPramide (REGLAN) 5 MG tablet Take 5 mg by mouth 4 (four) times daily.    ondansetron (ZOFRAN-ODT) 4 MG disintegrating tablet Take 4 mg by mouth every 8 (eight) hours as needed. Nausea    polyethylene glycol powder (GLYCOLAX/MIRALAX) powder Take 17 g by mouth as needed. Constipation         TOTAL DISCHARGE TIME: 35  mins  [facility]  [facility] Pager [phone]  06/17/2012, 12:26 PM

## 2012-06-17 NOTE — Progress Notes (Signed)
Discharge instructions given to pt, verbalized understanding. Left the unit in stable condition. 

## 2012-06-20 LAB — GLUCOSE, CAPILLARY: Glucose-Capillary: 80 mg/dL (ref 70–99)

## 2012-06-28 ENCOUNTER — Encounter: Payer: Self-pay | Admitting: *Deleted

## 2012-06-28 ENCOUNTER — Telehealth: Payer: Self-pay | Admitting: Internal Medicine

## 2012-06-28 NOTE — Telephone Encounter (Signed)
The daughter called back stating that the jury # is [id number].  Date of jury service is 06/30/12.  Please call her when letter is ready.

## 2012-06-28 NOTE — Telephone Encounter (Signed)
Spoke with Hamilton Capri and told her we need jury number, date of service and where she is to serve. She will call back with this information.

## 2012-06-28 NOTE — Telephone Encounter (Signed)
OK to excuse her from jury duty because she expectorates continuously into an emesis basin, also very unsteady on her feet- problems with balance

## 2012-06-28 NOTE — Telephone Encounter (Signed)
Letter up front for pick up. Patient's daughter aware.

## 2012-06-28 NOTE — Telephone Encounter (Signed)
Patient's daughter is requesting a letter to dismiss patient from jury duty on 06/30/12. Jury # [id number]. Please, advise.

## 2012-07-01 ENCOUNTER — Telehealth: Payer: Self-pay | Admitting: Internal Medicine

## 2012-07-01 DIAGNOSIS — K625 Hemorrhage of anus and rectum: Secondary | ICD-10-CM

## 2012-07-01 MED ORDER — HYDROCORTISONE ACETATE 25 MG RE SUPP: RECTAL | Status: DC

## 2012-07-01 NOTE — Telephone Encounter (Signed)
Spoke with patient and gave her Dr. Regino Schultze recommendations. Rx sent. Labs in EPIC.

## 2012-07-01 NOTE — Telephone Encounter (Signed)
Pt with low volume hematochezia, just had a full colonoscopy on 05/17/2012, bleeding from first grade hemorrhoids. She does not need an OV. Please send Anusol HC supp 1 bid, #12, 1 refill. Please check CBC today or on Monday 07/04/2012.

## 2012-07-01 NOTE — Telephone Encounter (Signed)
Spoke with patient and she states for the last 2 days, she has had bright, red blood on the tissue when she wipes. States some bright, red blood on stool. Denies black stool. Offered patient OV today but she does not have a ride. Scheduled patient with Mike Gip, PA on 07/04/12 at 10:30 AM. She understands to go to ED if bleeding gets worse, abdominal pain, black stools or coffee ground emesis.

## 2012-07-02 ENCOUNTER — Emergency Department (HOSPITAL_COMMUNITY)
Admission: EM | Admit: 2012-07-02 | Discharge: 2012-07-02 | Disposition: A | Discharge status: Home / Self Care | Payer: Medicare Other | Attending: Emergency Medicine | Admitting: Emergency Medicine

## 2012-07-02 ENCOUNTER — Encounter (HOSPITAL_COMMUNITY): Payer: Self-pay | Admitting: Emergency Medicine

## 2012-07-02 ENCOUNTER — Emergency Department (HOSPITAL_COMMUNITY): Payer: Medicare Other

## 2012-07-02 DIAGNOSIS — R1084 Generalized abdominal pain: Secondary | ICD-10-CM | POA: Insufficient documentation

## 2012-07-02 DIAGNOSIS — G8929 Other chronic pain: Secondary | ICD-10-CM | POA: Insufficient documentation

## 2012-07-02 DIAGNOSIS — Z79899 Other long term (current) drug therapy: Secondary | ICD-10-CM | POA: Insufficient documentation

## 2012-07-02 DIAGNOSIS — K921 Melena: Secondary | ICD-10-CM | POA: Insufficient documentation

## 2012-07-02 DIAGNOSIS — Z9861 Coronary angioplasty status: Secondary | ICD-10-CM | POA: Insufficient documentation

## 2012-07-02 DIAGNOSIS — Z8679 Personal history of other diseases of the circulatory system: Secondary | ICD-10-CM | POA: Insufficient documentation

## 2012-07-02 DIAGNOSIS — I10 Essential (primary) hypertension: Secondary | ICD-10-CM | POA: Insufficient documentation

## 2012-07-02 DIAGNOSIS — F329 Major depressive disorder, single episode, unspecified: Secondary | ICD-10-CM | POA: Insufficient documentation

## 2012-07-02 DIAGNOSIS — R7401 Elevation of levels of liver transaminase levels: Secondary | ICD-10-CM

## 2012-07-02 DIAGNOSIS — R112 Nausea with vomiting, unspecified: Secondary | ICD-10-CM

## 2012-07-02 DIAGNOSIS — F3289 Other specified depressive episodes: Secondary | ICD-10-CM | POA: Insufficient documentation

## 2012-07-02 DIAGNOSIS — R109 Unspecified abdominal pain: Secondary | ICD-10-CM

## 2012-07-02 DIAGNOSIS — R197 Diarrhea, unspecified: Secondary | ICD-10-CM | POA: Insufficient documentation

## 2012-07-02 DIAGNOSIS — R7402 Elevation of levels of lactic acid dehydrogenase (LDH): Secondary | ICD-10-CM | POA: Insufficient documentation

## 2012-07-02 DIAGNOSIS — Z87828 Personal history of other (healed) physical injury and trauma: Secondary | ICD-10-CM | POA: Insufficient documentation

## 2012-07-02 DIAGNOSIS — K219 Gastro-esophageal reflux disease without esophagitis: Secondary | ICD-10-CM | POA: Insufficient documentation

## 2012-07-02 DIAGNOSIS — Z8719 Personal history of other diseases of the digestive system: Secondary | ICD-10-CM | POA: Insufficient documentation

## 2012-07-02 DIAGNOSIS — Z8739 Personal history of other diseases of the musculoskeletal system and connective tissue: Secondary | ICD-10-CM | POA: Insufficient documentation

## 2012-07-02 DIAGNOSIS — Z9089 Acquired absence of other organs: Secondary | ICD-10-CM | POA: Insufficient documentation

## 2012-07-02 DIAGNOSIS — E119 Type 2 diabetes mellitus without complications: Secondary | ICD-10-CM | POA: Insufficient documentation

## 2012-07-02 DIAGNOSIS — F17200 Nicotine dependence, unspecified, uncomplicated: Secondary | ICD-10-CM | POA: Insufficient documentation

## 2012-07-02 LAB — CBC WITH DIFFERENTIAL/PLATELET
Basophils Absolute: 0 10*3/uL (ref 0.0–0.1)
Basophils Relative: 0 % (ref 0–1)
Eosinophils Absolute: 0 10*3/uL (ref 0.0–0.7)
Eosinophils Relative: 1 % (ref 0–5)
HCT: 36.6 % (ref 36.0–46.0)
Hemoglobin: 10.2 g/dL — ABNORMAL LOW (ref 12.0–15.0)
Lymphocytes Relative: 12 % (ref 12–46)
Lymphs Abs: 0.9 10*3/uL (ref 0.7–4.0)
MCH: 25.5 pg — ABNORMAL LOW (ref 26.0–34.0)
MCHC: 27.9 g/dL — ABNORMAL LOW (ref 30.0–36.0)
MCV: 91.5 fL (ref 78.0–100.0)
Monocytes Absolute: 1.2 10*3/uL — ABNORMAL HIGH (ref 0.1–1.0)
Monocytes Relative: 15 % — ABNORMAL HIGH (ref 3–12)
Neutro Abs: 5.5 10*3/uL (ref 1.7–7.7)
Neutrophils Relative %: 72 % (ref 43–77)
Platelets: 404 10*3/uL — ABNORMAL HIGH (ref 150–400)
RBC: 4 MIL/uL (ref 3.87–5.11)
RDW: 15.5 % (ref 11.5–15.5)
WBC: 7.7 10*3/uL (ref 4.0–10.5)

## 2012-07-02 LAB — LIPASE, BLOOD: Lipase: 40 U/L (ref 11–59)

## 2012-07-02 LAB — COMPREHENSIVE METABOLIC PANEL
ALT: 155 U/L — ABNORMAL HIGH (ref 0–35)
AST: 238 U/L — ABNORMAL HIGH (ref 0–37)
Albumin: 2.6 g/dL — ABNORMAL LOW (ref 3.5–5.2)
Alkaline Phosphatase: 176 U/L — ABNORMAL HIGH (ref 39–117)
BUN: 11 mg/dL (ref 6–23)
CO2: 22 mEq/L (ref 19–32)
Calcium: 8.4 mg/dL (ref 8.4–10.5)
Chloride: 106 mEq/L (ref 96–112)
Creatinine, Ser: 0.64 mg/dL (ref 0.50–1.10)
GFR calc Af Amer: >90 mL/min (ref >90)
GFR calc non Af Amer: 87 mL/min — ABNORMAL LOW (ref >90)
Glucose, Bld: 151 mg/dL — ABNORMAL HIGH (ref 70–99)
Potassium: 2.9 mEq/L — ABNORMAL LOW (ref 3.5–5.1)
Sodium: 141 mEq/L (ref 135–145)
Total Bilirubin: 0.5 mg/dL (ref 0.3–1.2)
Total Protein: 7.1 g/dL (ref 6.0–8.3)

## 2012-07-02 LAB — URINE MICROSCOPIC-ADD ON

## 2012-07-02 LAB — URINALYSIS, ROUTINE W REFLEX MICROSCOPIC
Glucose, UA: NEGATIVE mg/dL
Hgb urine dipstick: NEGATIVE
Nitrite: NEGATIVE
Protein, ur: 30 mg/dL — AB
Specific Gravity, Urine: >1.046 — ABNORMAL HIGH (ref 1.005–1.030)
Urobilinogen, UA: 1 mg/dL (ref 0.0–1.0)
pH: 5.5 (ref 5.0–8.0)

## 2012-07-02 LAB — OCCULT BLOOD, POC DEVICE: Fecal Occult Bld: NEGATIVE

## 2012-07-02 MED ORDER — METOCLOPRAMIDE HCL 5 MG/ML IJ SOLN: 10.0000 mg | Freq: Once | INTRAMUSCULAR | Status: DC

## 2012-07-02 MED ORDER — IOHEXOL 300 MG/ML  SOLN
100.0000 mL | Freq: Once | INTRAMUSCULAR | Status: AC | PRN
  Administered 2012-07-02: 100 mL via INTRAVENOUS

## 2012-07-02 MED ORDER — POTASSIUM CHLORIDE 10 MEQ/100ML IV SOLN
10.0000 meq | Freq: Once | INTRAVENOUS | Status: AC
  Administered 2012-07-02: 10 meq via INTRAVENOUS
  Filled 2012-07-02: qty 100

## 2012-07-02 MED ORDER — HYDROCODONE-ACETAMINOPHEN 5-325 MG PO TABS: 1.0000 | ORAL_TABLET | Freq: Four times a day (QID) | ORAL | Status: DC | PRN

## 2012-07-02 MED ORDER — ONDANSETRON HCL 4 MG/2ML IJ SOLN
4.0000 mg | Freq: Once | INTRAMUSCULAR | Status: DC
  Filled 2012-07-02: qty 2

## 2012-07-02 MED ORDER — ONDANSETRON HCL 4 MG/2ML IJ SOLN: 4.0000 mg | Freq: Once | INTRAMUSCULAR | Status: DC

## 2012-07-02 MED ORDER — MORPHINE SULFATE 4 MG/ML IJ SOLN
4.0000 mg | Freq: Once | INTRAMUSCULAR | Status: AC
  Administered 2012-07-02: 4 mg via INTRAVENOUS
  Filled 2012-07-02: qty 1

## 2012-07-02 MED ORDER — PROMETHAZINE HCL 25 MG PO TABS: 25.0000 mg | ORAL_TABLET | Freq: Four times a day (QID) | ORAL | Status: DC | PRN

## 2012-07-02 MED ORDER — POTASSIUM CHLORIDE CRYS ER 20 MEQ PO TBCR
40.0000 meq | EXTENDED_RELEASE_TABLET | Freq: Once | ORAL | Status: AC
  Administered 2012-07-02: 40 meq via ORAL
  Filled 2012-07-02: qty 1

## 2012-07-02 MED ORDER — PROMETHAZINE HCL 25 MG/ML IJ SOLN
25.0000 mg | Freq: Once | INTRAMUSCULAR | Status: AC
  Administered 2012-07-02: 25 mg via INTRAVENOUS
  Filled 2012-07-02: qty 1

## 2012-07-02 MED ORDER — IOHEXOL 300 MG/ML  SOLN
50.0000 mL | Freq: Once | INTRAMUSCULAR | Status: AC | PRN
  Administered 2012-07-02: 50 mL via ORAL

## 2012-07-02 MED ORDER — MAGNESIUM SULFATE 40 MG/ML IJ SOLN
2.0000 g | Freq: Once | INTRAMUSCULAR | Status: AC
  Administered 2012-07-02: 2 g via INTRAVENOUS
  Filled 2012-07-02: qty 50

## 2012-07-02 MED ORDER — SODIUM CHLORIDE 0.9 % IV BOLUS (SEPSIS)
1000.0000 mL | Freq: Once | INTRAVENOUS | Status: AC
  Administered 2012-07-02: 1000 mL via INTRAVENOUS

## 2012-07-02 MED ORDER — PROMETHAZINE HCL 25 MG/ML IJ SOLN
12.5000 mg | INTRAMUSCULAR | Status: AC
  Administered 2012-07-02: 12.5 mg via INTRAVENOUS
  Filled 2012-07-02: qty 1
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
## 2012-07-02 NOTE — ED Notes (Signed)
Pt also c/o L breast pain and R groin pain

## 2012-07-02 NOTE — ED Notes (Signed)
Pt also c/o bloody stools

## 2012-07-02 NOTE — ED Provider Notes (Signed)
6:14 AM Assumed care of the patient from [facility], PA-C.  Patient presents today with a chief complaint of nausea, vomiting, diarrhea, and abdominal pain.  Plan is for the patient to have a CT ab/pelvis.  Reassessed patient.  She states that her pain and nausea are controlled at this time.  Patient alert and orientated x 3, Heart RRR, Lungs CTAB, Abdomen soft and diffusely tender.  7:41 AM Patient reports that her symptoms have improved some what.  However, she continues to have some pain and some nausea.  She is requesting another dose of pain medication and another dose of Phenergan.  No active vomiting.  Discussed results of the CT scan with the patient and with Dr. Juleen China. Feel that patient can be discharged home with GI follow up once symptoms have resolved.  8:37 AM Reassessed patient.  Symptoms have improved at this time.  Patient tolerating PO liquids.  No active vomiting.  Will discharge patient home.  Patient instructed to follow up with GI.  Pascal Lux [place], PA-C 07/02/12 [phone]

## 2012-07-02 NOTE — ED Notes (Signed)
Pt c/o continuous vomiting x 3 days. Active vomiting in triage. + diarrhea.

## 2012-07-03 NOTE — ED Provider Notes (Signed)
Medical screening examination/treatment/procedure(s) were performed by non-physician practitioner and as supervising physician I was immediately available for consultation/collaboration.   Loren Racer, MD 07/03/12 [phone]

## 2012-07-04 ENCOUNTER — Other Ambulatory Visit (INDEPENDENT_AMBULATORY_CARE_PROVIDER_SITE_OTHER): Payer: Medicare Other

## 2012-07-04 ENCOUNTER — Encounter: Payer: Self-pay | Admitting: Physician Assistant

## 2012-07-04 ENCOUNTER — Telehealth: Payer: Self-pay | Admitting: *Deleted

## 2012-07-04 ENCOUNTER — Ambulatory Visit (INDEPENDENT_AMBULATORY_CARE_PROVIDER_SITE_OTHER): Payer: Medicare Other | Admitting: Physician Assistant

## 2012-07-04 ENCOUNTER — Telehealth: Payer: Self-pay | Admitting: Internal Medicine

## 2012-07-04 ENCOUNTER — Ambulatory Visit: Payer: Medicare Other | Admitting: Physician Assistant

## 2012-07-04 VITALS — BP 90/50 | HR 147 | Ht 64.0 in | Wt 163.2 lb

## 2012-07-04 DIAGNOSIS — I498 Other specified cardiac arrhythmias: Secondary | ICD-10-CM

## 2012-07-04 DIAGNOSIS — Z903 Acquired absence of stomach [part of]: Secondary | ICD-10-CM

## 2012-07-04 DIAGNOSIS — K625 Hemorrhage of anus and rectum: Secondary | ICD-10-CM

## 2012-07-04 DIAGNOSIS — K3184 Gastroparesis: Secondary | ICD-10-CM

## 2012-07-04 DIAGNOSIS — E876 Hypokalemia: Secondary | ICD-10-CM

## 2012-07-04 DIAGNOSIS — Z9889 Other specified postprocedural states: Secondary | ICD-10-CM

## 2012-07-04 DIAGNOSIS — R000 Tachycardia, unspecified: Secondary | ICD-10-CM

## 2012-07-04 LAB — CBC WITH DIFFERENTIAL/PLATELET
Basophils Absolute: 0 K/uL (ref 0.0–0.1)
Basophils Relative: 0.1 % (ref 0.0–3.0)
Eosinophils Absolute: 0.1 K/uL (ref 0.0–0.7)
Eosinophils Relative: 0.8 % (ref 0.0–5.0)
HCT: 34.1 % — ABNORMAL LOW (ref 36.0–46.0)
Hemoglobin: 10.9 g/dL — ABNORMAL LOW (ref 12.0–15.0)
Lymphocytes Relative: 8.8 % — ABNORMAL LOW (ref 12.0–46.0)
Lymphs Abs: 1.2 K/uL (ref 0.7–4.0)
MCHC: 31.8 g/dL (ref 30.0–36.0)
MCV: 81.1 fl (ref 78.0–100.0)
Monocytes Absolute: 1 K/uL (ref 0.1–1.0)
Monocytes Relative: 7.3 % (ref 3.0–12.0)
Neutro Abs: 11.1 K/uL — ABNORMAL HIGH (ref 1.4–7.7)
Neutrophils Relative %: 83 % — ABNORMAL HIGH (ref 43.0–77.0)
Platelets: 371 K/uL (ref 150.0–400.0)
RBC: 4.21 Mil/uL (ref 3.87–5.11)
RDW: 15.8 % — ABNORMAL HIGH (ref 11.5–14.6)
WBC: 13.4 K/uL — ABNORMAL HIGH (ref 4.5–10.5)

## 2012-07-04 LAB — POTASSIUM: Potassium: 3.8 meq/L (ref 3.5–5.1)

## 2012-07-04 MED ORDER — PROMETHAZINE HCL 25 MG PO TABS: ORAL_TABLET | ORAL | Status: DC

## 2012-07-04 MED ORDER — HYDROCODONE-ACETAMINOPHEN 5-325 MG PO TABS: 1.0000 | ORAL_TABLET | Freq: Four times a day (QID) | ORAL | Status: DC | PRN

## 2012-07-04 MED ORDER — METOCLOPRAMIDE HCL 5 MG PO TABS: ORAL_TABLET | ORAL | Status: DC

## 2012-07-04 NOTE — Patient Instructions (Addendum)
Please go to the basement level to have your labs drawn.   We made you a follow up appointment with Dr. Juanda Chance for 08-09-2012 at 1:45 PM.  We made you an appointment with Dr. Antoine Poche at [facility] for 07-29-2012 , arrive at 8:45 Am for a 9:00 am appointment. Take in 8 oz of water through the Jejunal tube 3 times daily.     We faxed perscriptions to [facility] [place] for the Vicodin, Phenergan and the Reglan was sent electronically.

## 2012-07-04 NOTE — Progress Notes (Signed)
Subjective:    Patient ID: Emily [name], female    DOB: 08/19/1940, 72 y.o.   MRN: [id number]  HPI  Emily [name] is a pleasant 72 year old African [name] well known to Dr. Lina [name] who has history of severe gastroparesis she is status post subtotal gastrectomy and Roux-en-Y proximally 2 years ago done at [facility] for chronic vomiting and severe gastroparesis. Postoperatively she has continued to have ongoing daily "spitting up". She also has history of chronic GERD previously failed Nissen fundoplication and Bell's a gastropexy. Over the past couple of years she has had little oral intake or at least intermittent oral intake and relies primarily on her jejunal feeding which she does at night. She has gained about 30 pounds over the past year. She was in to see Dr. Dickie [name] in January of 2014 and at that time was doing fairly well. Since then she has undergone colonoscopy for screening. She had an ER visit this last weekend with complaints of nausea vomiting loose stools and lower abdominal pain. CT of the abdomen and pelvis was done on 07/02/2012 shows her to be status post cholecystectomy, she is a chronically dilated common bile duct at 1.3 cm with no interval change and focal dilation of the distal pancreatic duct. There were no acute findings.. Stool was documented to be Hemoccult negative WBC is 7.7 hemoglobin 10.2 hematocrit of 30 of 1.6 potassium 2.9 total bilirubin of 0.5 AST 238 ALT 155 and alkaline phosphatase of 176. She says she has not been drinking any alcohol since she was discharged from the hospital a week or so ago. She was given hydrocodone in the emergency room and says that this along with Phenergan helps. She has not been vomiting over the past 24 hours and has not had diarrhea. She describes her pain as being very low in her abdomen primarily on the right side but she has also noticed some on the light left side this is radiating into her groin and around into her back to  the  Midline.    Review of Systems  Constitutional: Negative.   HENT: Negative.   Eyes: Negative.   Respiratory: Negative.   Cardiovascular: Positive for palpitations.  Gastrointestinal: Positive for nausea, vomiting and abdominal pain.  Endocrine: Negative.   Genitourinary: Negative.   Musculoskeletal: Positive for back pain and arthralgias.  Skin: Negative.   Allergic/Immunologic: Negative.   Neurological: Negative.   Hematological: Negative.   Psychiatric/Behavioral: Negative.    Outpatient Prescriptions Prior to Visit  Medication Sig Dispense Refill  . acetaminophen (TYLENOL) 500 MG tablet Take 1,000 mg by mouth every 4 (four) hours as needed. Pain      . hydrocortisone (ANUSOL-HC) 25 MG suppository Insert one rectally BID  12 suppository  1  . LORazepam (ATIVAN) 0.5 MG tablet Take 0.5 mg by mouth every 8 (eight) hours as needed. Take 1 tablet by mouth three times daily as needed.      Marland Kitchen omeprazole (PRILOSEC) 20 MG capsule Take 1 capsule (20 mg total) by mouth 2 (two) times daily.  60 capsule  2  . ondansetron (ZOFRAN-ODT) 4 MG disintegrating tablet Take 4 mg by mouth every 8 (eight) hours as needed. Nausea      . polyethylene glycol powder (GLYCOLAX/MIRALAX) powder Take 17 g by mouth as needed. Constipation      . HYDROcodone-acetaminophen (NORCO/VICODIN) 5-325 MG per tablet Take 1-2 tablets by mouth every 6 (six) hours as needed for pain.  20 tablet  0  . HYDROcodone-acetaminophen (VICODIN)  5-500 MG per tablet Take 1 tablet by mouth every 6 (six) hours as needed. Pain      . metoCLOPramide (REGLAN) 5 MG tablet Take 5 mg by mouth 4 (four) times daily.      . promethazine (PHENERGAN) 25 MG tablet Take 1 tablet (25 mg total) by mouth every 6 (six) hours as needed for nausea.  20 tablet  0   No facility-administered medications prior to visit.   No Known Allergies Patient Active Problem List  Diagnosis  . DM, UNCOMPLICATED, TYPE II  . HYPOKALEMIA  . ANEMIA, IRON DEFICIENCY   . DISORDER, DEPRESSIVE NEC  . HYPERTENSION  . 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 SINUS TACHYCARDIA  . HEMORRHOIDS, INTERNAL  . GERD  . Gastroparesis  . UNSPECIFIED GASTROSTOMY COMPLICATION  . DIVERTICULOSIS, COLON  . Irritable bowel syndrome  . GI BLEED  . OSTEOARTHRITIS  . ARTHRITIS  . HIP PAIN, BILATERAL  . TROCHANTERIC BURSITIS, LEFT  . LEG PAIN, RIGHT  . SLEEP APNEA  . WEIGHT LOSS-ABNORMAL  . DYSPNEA ON EXERTION  . CHEST PAIN, EXERTIONAL  . Nausea with vomiting  . NAUSEA  . OTHER DYSPHAGIA  . Abdominal pain, epigastric  . Dysphagia  . Dehydration, moderate  . Anemia, unspecified  . Other and unspecified noninfectious gastroenteritis and colitis  . Right hip pain   History  Substance Use Topics  . Smoking status: Current Every Day Smoker -- 0.25 packs/day    Types: Cigarettes    Last Attempt to Quit: 05/04/1994  . Smokeless tobacco: Never Used  . Alcohol Use: 1.2 oz/week    2 Cans of beer per week     Comment: last alcohol level 02/16/11- 133   family history includes Colon cancer (age of onset: 30) in her brother.  There is no history of Rectal cancer and Stomach cancer.     Objective:   Physical Exam well-developed older African [name] in no acute distress. Blood pressure 98/50 pulse 130 , height 5 foot 4 weight 163. HEENT; nontraumatic normocephalic EOMI PERRLA sclera anicteric. She is spitting into an emesis basin, Cardiovascular; tachycardia regular rhythm with S1-S2 no murmur or gallop, Pulmonary; clear bilaterally, Abdomen; soft there is no focal tenderness no palpable mass no hepatosplenomegaly bowel sounds are present she has a jejunal tube in place, Rectal; exam not done, Extremities ;no clubbing cyanosis or edema skin warm and dry, Psych; mood and affect normal and appropriate.        Assessment & Plan:  #51 72 year old female with chronic gastroparesis status post subtotal gastrectomy and Roux-en-Y gastrojejunostomy with chronic  jejunal feeding tube-recent hospital admission with nausea and vomiting and ER visit about 48 hours ago with same as well as complaints of lower abdominal pain which are likely musculoskeletal and/or due  to her chronic disc disease. Vomiting has resolved and on careful questioning she really was not having diarrhea just  loose stool.  #2 chronic tachycardia-no recent cardiac evaluation and had been on a beta blocker at one point I'm not sure why this was discontinued #3 hypokalemia secondary to #1 #4 diabetes #5 hemorrhoidal bleeding-resolved  Plan; we'll repeat potassium level today Have asked her to start giving herself 8 ounces of water via the jejunal tube 3 times daily to improve her overall hydration Continue current regiment a regimen of medications, have refilled Phenergan tablets, hydrocodone 5/325 and metoclopramide 5 mg 3 times daily. Followup office visit with cardiology for reassessment of her chronic tachycardia Followup with Dr. Verlee Monte  Brodie in one month

## 2012-07-04 NOTE — Progress Notes (Signed)
Reviewed and agree. Consider MRCP to r/o extrahepatic obstruction

## 2012-07-04 NOTE — Telephone Encounter (Signed)
Called Emily [name] to advise her appointment with Dr. Sarina Ser  on 07-29-2012 to arrive at 8:45 Am for a 9:00 Am appointment. She had her son write it down for her.

## 2012-07-04 NOTE — Telephone Encounter (Signed)
Spoke with patient and scheduled her today at 10:30 with Mike Gip, PA

## 2012-07-05 NOTE — ED Provider Notes (Signed)
Medical screening examination/treatment/procedure(s) were performed by non-physician practitioner and as supervising physician I was immediately available for consultation/collaboration.   Loren Racer, MD 07/05/12 [phone]

## 2012-07-28 ENCOUNTER — Telehealth: Payer: Self-pay | Admitting: Internal Medicine

## 2012-07-28 NOTE — Telephone Encounter (Signed)
Called patient and her family had to put her on the phone and tell her to talk to me. Patient states her stomach is hurting like it was 2 weeks ago. States she has nausea, diarrhea and constipation. Patient was moaning into the phone. Asked patient if she had been drinking. She states yes. I asked her what she has been drinking and she states "beer." Please, advise.

## 2012-07-28 NOTE — Telephone Encounter (Signed)
Please increase Prilosec to 20 mg po bid and add Carafate  slurry 10cc po bid, #12 oz, 1 refill,

## 2012-07-29 ENCOUNTER — Institutional Professional Consult (permissible substitution): Payer: Medicare Other | Admitting: Cardiology

## 2012-07-29 MED ORDER — SUCRALFATE 1 GM/10ML PO SUSP: ORAL | Status: DC

## 2012-07-29 NOTE — Telephone Encounter (Signed)
Rx sent to pharmacy. Left a message for patient to call me. 

## 2012-07-29 NOTE — Telephone Encounter (Signed)
Spoke with patient and gave her Dr. Brodie's recommendations. 

## 2012-08-07 ENCOUNTER — Other Ambulatory Visit: Payer: Self-pay | Admitting: Physician Assistant

## 2012-08-09 ENCOUNTER — Other Ambulatory Visit: Payer: Self-pay | Admitting: *Deleted

## 2012-08-09 ENCOUNTER — Encounter: Payer: Self-pay | Admitting: Internal Medicine

## 2012-08-09 ENCOUNTER — Ambulatory Visit (INDEPENDENT_AMBULATORY_CARE_PROVIDER_SITE_OTHER): Payer: Medicare Other | Admitting: Internal Medicine

## 2012-08-09 ENCOUNTER — Other Ambulatory Visit (INDEPENDENT_AMBULATORY_CARE_PROVIDER_SITE_OTHER): Payer: Medicare Other

## 2012-08-09 VITALS — BP 100/60 | HR 117 | Ht 64.0 in | Wt 146.0 lb

## 2012-08-09 DIAGNOSIS — R933 Abnormal findings on diagnostic imaging of other parts of digestive tract: Secondary | ICD-10-CM

## 2012-08-09 DIAGNOSIS — R633 Feeding difficulties, unspecified: Secondary | ICD-10-CM

## 2012-08-09 DIAGNOSIS — K3184 Gastroparesis: Secondary | ICD-10-CM

## 2012-08-09 LAB — AMYLASE: Amylase: 49 U/L (ref 27–131)

## 2012-08-09 LAB — LIPASE: Lipase: 56 U/L (ref 11.0–59.0)

## 2012-08-09 LAB — HEPATIC FUNCTION PANEL
ALT: 23 U/L (ref 0–35)
AST: 27 U/L (ref 0–37)
Albumin: 2.5 g/dL — ABNORMAL LOW (ref 3.5–5.2)
Alkaline Phosphatase: 198 U/L — ABNORMAL HIGH (ref 39–117)
Bilirubin, Direct: 0.4 mg/dL — ABNORMAL HIGH (ref 0.0–0.3)
Total Bilirubin: 0.9 mg/dL (ref 0.3–1.2)
Total Protein: 8.2 g/dL (ref 6.0–8.3)

## 2012-08-09 MED ORDER — HYDROCODONE-ACETAMINOPHEN 5-325 MG PO TABS: 1.0000 | ORAL_TABLET | Freq: Four times a day (QID) | ORAL | Status: DC | PRN

## 2012-08-09 NOTE — Patient Instructions (Addendum)
You have been scheduled for an MRCP at [facility] on Friday 08/12/12. Your appointment time is 8:00 am. Please arrive 15 minutes prior to your appointment time for registration purposes. There is no prep for this test. However, if you have any metal in your body, have a pacemaker or defibrillator, please be sure to let your ordering physician know. This test typically takes 45 minutes to 1 hour to complete.  Your Norco should already be at the pharmacy.  Your physician has requested that you go to the basement for the following lab work before leaving today: Amylase, Lipase, Hepatic Panel  CC: Dr Fleet Contras
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
## 2012-08-11 ENCOUNTER — Other Ambulatory Visit: Payer: Self-pay | Admitting: Internal Medicine

## 2012-08-11 DIAGNOSIS — R933 Abnormal findings on diagnostic imaging of other parts of digestive tract: Secondary | ICD-10-CM

## 2012-08-12 ENCOUNTER — Encounter (HOSPITAL_COMMUNITY): Payer: Self-pay | Admitting: Emergency Medicine

## 2012-08-12 ENCOUNTER — Emergency Department (HOSPITAL_COMMUNITY): Payer: Medicare Other

## 2012-08-12 ENCOUNTER — Inpatient Hospital Stay (HOSPITAL_COMMUNITY): Payer: Medicare Other

## 2012-08-12 ENCOUNTER — Ambulatory Visit (HOSPITAL_COMMUNITY): Admission: RE | Admit: 2012-08-12 | Payer: Medicare Other | Source: Ambulatory Visit

## 2012-08-12 ENCOUNTER — Inpatient Hospital Stay (HOSPITAL_COMMUNITY)
Admission: EM | Admit: 2012-08-12 | Discharge: 2012-08-19 | DRG: 391 | Disposition: A | Discharge status: Home Health | Payer: Medicare Other | Attending: Internal Medicine | Admitting: Internal Medicine

## 2012-08-12 DIAGNOSIS — R3989 Other symptoms and signs involving the genitourinary system: Secondary | ICD-10-CM | POA: Diagnosis present

## 2012-08-12 DIAGNOSIS — F1010 Alcohol abuse, uncomplicated: Secondary | ICD-10-CM | POA: Diagnosis present

## 2012-08-12 DIAGNOSIS — R1013 Epigastric pain: Secondary | ICD-10-CM

## 2012-08-12 DIAGNOSIS — E119 Type 2 diabetes mellitus without complications: Secondary | ICD-10-CM | POA: Diagnosis present

## 2012-08-12 DIAGNOSIS — J850 Gangrene and necrosis of lung: Secondary | ICD-10-CM

## 2012-08-12 DIAGNOSIS — N289 Disorder of kidney and ureter, unspecified: Secondary | ICD-10-CM

## 2012-08-12 DIAGNOSIS — R112 Nausea with vomiting, unspecified: Secondary | ICD-10-CM

## 2012-08-12 DIAGNOSIS — R911 Solitary pulmonary nodule: Secondary | ICD-10-CM | POA: Diagnosis present

## 2012-08-12 DIAGNOSIS — R7989 Other specified abnormal findings of blood chemistry: Secondary | ICD-10-CM

## 2012-08-12 DIAGNOSIS — G8929 Other chronic pain: Secondary | ICD-10-CM | POA: Diagnosis present

## 2012-08-12 DIAGNOSIS — F411 Generalized anxiety disorder: Secondary | ICD-10-CM | POA: Diagnosis present

## 2012-08-12 DIAGNOSIS — E871 Hypo-osmolality and hyponatremia: Secondary | ICD-10-CM | POA: Diagnosis present

## 2012-08-12 DIAGNOSIS — I10 Essential (primary) hypertension: Secondary | ICD-10-CM | POA: Diagnosis present

## 2012-08-12 DIAGNOSIS — J189 Pneumonia, unspecified organism: Secondary | ICD-10-CM | POA: Diagnosis present

## 2012-08-12 DIAGNOSIS — E876 Hypokalemia: Secondary | ICD-10-CM | POA: Diagnosis present

## 2012-08-12 DIAGNOSIS — K838 Other specified diseases of biliary tract: Secondary | ICD-10-CM

## 2012-08-12 DIAGNOSIS — N390 Urinary tract infection, site not specified: Secondary | ICD-10-CM | POA: Diagnosis present

## 2012-08-12 DIAGNOSIS — J690 Pneumonitis due to inhalation of food and vomit: Secondary | ICD-10-CM | POA: Diagnosis present

## 2012-08-12 DIAGNOSIS — F329 Major depressive disorder, single episode, unspecified: Secondary | ICD-10-CM | POA: Diagnosis present

## 2012-08-12 DIAGNOSIS — D72829 Elevated white blood cell count, unspecified: Secondary | ICD-10-CM | POA: Diagnosis present

## 2012-08-12 DIAGNOSIS — D509 Iron deficiency anemia, unspecified: Secondary | ICD-10-CM | POA: Diagnosis present

## 2012-08-12 DIAGNOSIS — R109 Unspecified abdominal pain: Secondary | ICD-10-CM | POA: Diagnosis present

## 2012-08-12 DIAGNOSIS — R933 Abnormal findings on diagnostic imaging of other parts of digestive tract: Secondary | ICD-10-CM

## 2012-08-12 DIAGNOSIS — K3184 Gastroparesis: Principal | ICD-10-CM | POA: Diagnosis present

## 2012-08-12 DIAGNOSIS — F3289 Other specified depressive episodes: Secondary | ICD-10-CM | POA: Diagnosis present

## 2012-08-12 DIAGNOSIS — Z903 Acquired absence of stomach [part of]: Secondary | ICD-10-CM

## 2012-08-12 DIAGNOSIS — G4730 Sleep apnea, unspecified: Secondary | ICD-10-CM | POA: Diagnosis present

## 2012-08-12 DIAGNOSIS — K922 Gastrointestinal hemorrhage, unspecified: Secondary | ICD-10-CM

## 2012-08-12 DIAGNOSIS — K219 Gastro-esophageal reflux disease without esophagitis: Secondary | ICD-10-CM | POA: Diagnosis present

## 2012-08-12 DIAGNOSIS — F17200 Nicotine dependence, unspecified, uncomplicated: Secondary | ICD-10-CM | POA: Diagnosis present

## 2012-08-12 DIAGNOSIS — N179 Acute kidney failure, unspecified: Secondary | ICD-10-CM

## 2012-08-12 DIAGNOSIS — K224 Dyskinesia of esophagus: Secondary | ICD-10-CM | POA: Diagnosis present

## 2012-08-12 DIAGNOSIS — K5730 Diverticulosis of large intestine without perforation or abscess without bleeding: Secondary | ICD-10-CM

## 2012-08-12 DIAGNOSIS — J852 Abscess of lung without pneumonia: Secondary | ICD-10-CM | POA: Diagnosis present

## 2012-08-12 DIAGNOSIS — Z934 Other artificial openings of gastrointestinal tract status: Secondary | ICD-10-CM

## 2012-08-12 DIAGNOSIS — R748 Abnormal levels of other serum enzymes: Secondary | ICD-10-CM | POA: Diagnosis present

## 2012-08-12 DIAGNOSIS — K861 Other chronic pancreatitis: Secondary | ICD-10-CM | POA: Diagnosis present

## 2012-08-12 DIAGNOSIS — Z79899 Other long term (current) drug therapy: Secondary | ICD-10-CM

## 2012-08-12 DIAGNOSIS — E861 Hypovolemia: Secondary | ICD-10-CM | POA: Diagnosis present

## 2012-08-12 DIAGNOSIS — K5289 Other specified noninfective gastroenteritis and colitis: Secondary | ICD-10-CM | POA: Diagnosis present

## 2012-08-12 LAB — COMPREHENSIVE METABOLIC PANEL
ALT: 23 U/L (ref 0–35)
AST: 30 U/L (ref 0–37)
Albumin: 2.2 g/dL — ABNORMAL LOW (ref 3.5–5.2)
Alkaline Phosphatase: 208 U/L — ABNORMAL HIGH (ref 39–117)
BUN: 41 mg/dL — ABNORMAL HIGH (ref 6–23)
CO2: 22 mEq/L (ref 19–32)
Calcium: 8.4 mg/dL (ref 8.4–10.5)
Chloride: 93 mEq/L — ABNORMAL LOW (ref 96–112)
Creatinine, Ser: 1.2 mg/dL — ABNORMAL HIGH (ref 0.50–1.10)
GFR calc Af Amer: 51 mL/min — ABNORMAL LOW (ref >90)
GFR calc non Af Amer: 44 mL/min — ABNORMAL LOW (ref >90)
Glucose, Bld: 140 mg/dL — ABNORMAL HIGH (ref 70–99)
Potassium: 3.1 mEq/L — ABNORMAL LOW (ref 3.5–5.1)
Sodium: 130 mEq/L — ABNORMAL LOW (ref 135–145)
Total Bilirubin: 0.6 mg/dL (ref 0.3–1.2)
Total Protein: 7.3 g/dL (ref 6.0–8.3)

## 2012-08-12 LAB — URINALYSIS, ROUTINE W REFLEX MICROSCOPIC
Glucose, UA: NEGATIVE mg/dL
Ketones, ur: NEGATIVE mg/dL
Nitrite: NEGATIVE
Protein, ur: 30 mg/dL — AB
Specific Gravity, Urine: 1.023 (ref 1.005–1.030)
Urobilinogen, UA: 1 mg/dL (ref 0.0–1.0)
pH: 5.5 (ref 5.0–8.0)

## 2012-08-12 LAB — CBC WITH DIFFERENTIAL/PLATELET
Basophils Absolute: 0 10*3/uL (ref 0.0–0.1)
Basophils Relative: 0 % (ref 0–1)
Eosinophils Absolute: 0 10*3/uL (ref 0.0–0.7)
Eosinophils Relative: 0 % (ref 0–5)
HCT: 34 % — ABNORMAL LOW (ref 36.0–46.0)
Hemoglobin: 11.3 g/dL — ABNORMAL LOW (ref 12.0–15.0)
Lymphocytes Relative: 6 % — ABNORMAL LOW (ref 12–46)
Lymphs Abs: 1.1 10*3/uL (ref 0.7–4.0)
MCH: 25.7 pg — ABNORMAL LOW (ref 26.0–34.0)
MCHC: 33.2 g/dL (ref 30.0–36.0)
MCV: 77.4 fL — ABNORMAL LOW (ref 78.0–100.0)
Monocytes Absolute: 1.2 10*3/uL — ABNORMAL HIGH (ref 0.1–1.0)
Monocytes Relative: 6 % (ref 3–12)
Neutro Abs: 16.8 10*3/uL — ABNORMAL HIGH (ref 1.7–7.7)
Neutrophils Relative %: 88 % — ABNORMAL HIGH (ref 43–77)
Platelets: 485 10*3/uL — ABNORMAL HIGH (ref 150–400)
RBC: 4.39 MIL/uL (ref 3.87–5.11)
RDW: 14.2 % (ref 11.5–15.5)
WBC: 19.2 10*3/uL — ABNORMAL HIGH (ref 4.0–10.5)

## 2012-08-12 LAB — URINE MICROSCOPIC-ADD ON

## 2012-08-12 LAB — RAPID URINE DRUG SCREEN, HOSP PERFORMED
Amphetamines: NOT DETECTED
Barbiturates: NOT DETECTED
Benzodiazepines: NOT DETECTED
Cocaine: NOT DETECTED
Opiates: POSITIVE — AB
Tetrahydrocannabinol: NOT DETECTED

## 2012-08-12 LAB — LIPASE, BLOOD: Lipase: 53 U/L (ref 11–59)

## 2012-08-12 LAB — ETHANOL: Alcohol, Ethyl (B): <11 mg/dL (ref 0–11)

## 2012-08-12 MED ORDER — DEXTROSE-NACL 5-0.45 % IV SOLN
INTRAVENOUS | Status: AC
  Administered 2012-08-13: 1000 mL via INTRAVENOUS

## 2012-08-12 MED ORDER — PIPERACILLIN-TAZOBACTAM 3.375 G IVPB
3.3750 g | Freq: Three times a day (TID) | INTRAVENOUS | Status: DC
  Administered 2012-08-13 – 2012-08-17 (×14): 3.375 g via INTRAVENOUS
  Filled 2012-08-12 (×15): qty 50

## 2012-08-12 MED ORDER — POTASSIUM CHLORIDE 10 MEQ/100ML IV SOLN
10.0000 meq | INTRAVENOUS | Status: AC
  Administered 2012-08-12 – 2012-08-13 (×3): 10 meq via INTRAVENOUS
  Filled 2012-08-12 (×3): qty 100

## 2012-08-12 MED ORDER — METOCLOPRAMIDE HCL 5 MG/ML IJ SOLN
10.0000 mg | Freq: Once | INTRAMUSCULAR | Status: AC
  Administered 2012-08-12: 10 mg via INTRAVENOUS
  Filled 2012-08-12: qty 2

## 2012-08-12 MED ORDER — ENOXAPARIN SODIUM 40 MG/0.4ML ~~LOC~~ SOLN
40.0000 mg | Freq: Every day | SUBCUTANEOUS | Status: DC
  Administered 2012-08-13 – 2012-08-18 (×7): 40 mg via SUBCUTANEOUS
  Filled 2012-08-12 (×8): qty 0.4

## 2012-08-12 MED ORDER — HYDROMORPHONE HCL PF 1 MG/ML IJ SOLN
1.0000 mg | INTRAMUSCULAR | Status: DC | PRN
  Administered 2012-08-13 (×3): 1 mg via INTRAVENOUS
  Administered 2012-08-13: 2 mg via INTRAVENOUS
  Administered 2012-08-14: 1 mg via INTRAVENOUS
  Administered 2012-08-14 (×3): 2 mg via INTRAVENOUS
  Administered 2012-08-14: 1 mg via INTRAVENOUS
  Administered 2012-08-15: 2 mg via INTRAVENOUS
  Administered 2012-08-15: 1 mg via INTRAVENOUS
  Administered 2012-08-15: 2 mg via INTRAVENOUS
  Administered 2012-08-15: 1 mg via INTRAVENOUS
  Administered 2012-08-16: 2 mg via INTRAVENOUS
  Administered 2012-08-16: 1 mg via INTRAVENOUS
  Administered 2012-08-16 – 2012-08-17 (×3): 2 mg via INTRAVENOUS
  Filled 2012-08-12: qty 2
  Filled 2012-08-12: qty 1
  Filled 2012-08-12 (×2): qty 2
  Filled 2012-08-12 (×2): qty 1
  Filled 2012-08-12: qty 2
  Filled 2012-08-12: qty 1
  Filled 2012-08-12 (×3): qty 2
  Filled 2012-08-12 (×2): qty 1
  Filled 2012-08-12 (×2): qty 2
  Filled 2012-08-12: qty 1
  Filled 2012-08-12: qty 2
  Filled 2012-08-12: qty 1

## 2012-08-12 MED ORDER — METOCLOPRAMIDE HCL 5 MG/ML IJ SOLN
10.0000 mg | Freq: Three times a day (TID) | INTRAMUSCULAR | Status: DC
  Administered 2012-08-13 – 2012-08-19 (×20): 10 mg via INTRAVENOUS
  Filled 2012-08-12 (×26): qty 2

## 2012-08-12 MED ORDER — VANCOMYCIN HCL IN DEXTROSE 1-5 GM/200ML-% IV SOLN
1000.0000 mg | Freq: Once | INTRAVENOUS | Status: AC
  Administered 2012-08-12: 1000 mg via INTRAVENOUS
  Filled 2012-08-12: qty 200

## 2012-08-12 MED ORDER — PIPERACILLIN-TAZOBACTAM 3.375 G IVPB
3.3750 g | Freq: Once | INTRAVENOUS | Status: AC
  Administered 2012-08-12: 3.375 g via INTRAVENOUS
  Filled 2012-08-12: qty 50

## 2012-08-12 MED ORDER — VANCOMYCIN HCL IN DEXTROSE 1-5 GM/200ML-% IV SOLN
1000.0000 mg | INTRAVENOUS | Status: DC
  Administered 2012-08-13: 1000 mg via INTRAVENOUS
  Filled 2012-08-12: qty 200

## 2012-08-12 MED ORDER — ACETAMINOPHEN 325 MG PO TABS
650.0000 mg | ORAL_TABLET | Freq: Four times a day (QID) | ORAL | Status: DC | PRN
  Administered 2012-08-14 – 2012-08-17 (×3): 650 mg
  Filled 2012-08-12 (×3): qty 2

## 2012-08-12 MED ORDER — ACETAMINOPHEN 650 MG RE SUPP: 650.0000 mg | Freq: Four times a day (QID) | RECTAL | Status: DC | PRN

## 2012-08-12 MED ORDER — PROMETHAZINE HCL 25 MG PO TABS
12.5000 mg | ORAL_TABLET | Freq: Four times a day (QID) | ORAL | Status: DC | PRN
  Administered 2012-08-13 – 2012-08-16 (×8): 12.5 mg via ORAL
  Filled 2012-08-12 (×8): qty 1

## 2012-08-12 MED ORDER — POLYETHYLENE GLYCOL 3350 17 GM/SCOOP PO POWD
17.0000 g | ORAL | Status: DC | PRN
  Filled 2012-08-12: qty 255

## 2012-08-12 MED ORDER — SUCRALFATE 1 GM/10ML PO SUSP
1.0000 g | Freq: Three times a day (TID) | ORAL | Status: DC
  Administered 2012-08-13 – 2012-08-19 (×25): 1 g
  Filled 2012-08-12 (×30): qty 10

## 2012-08-12 MED ORDER — POLYETHYLENE GLYCOL 3350 17 G PO PACK
17.0000 g | PACK | Freq: Every day | ORAL | Status: DC | PRN
  Filled 2012-08-12: qty 1

## 2012-08-12 MED ORDER — SODIUM CHLORIDE 0.9 % IV BOLUS (SEPSIS)
500.0000 mL | Freq: Once | INTRAVENOUS | Status: AC
  Administered 2012-08-12: 500 mL via INTRAVENOUS

## 2012-08-12 MED ORDER — PANTOPRAZOLE SODIUM 40 MG IV SOLR
40.0000 mg | Freq: Two times a day (BID) | INTRAVENOUS | Status: DC
  Administered 2012-08-13 – 2012-08-19 (×14): 40 mg via INTRAVENOUS
  Filled 2012-08-12 (×15): qty 40

## 2012-08-12 MED ORDER — LORAZEPAM 2 MG/ML IJ SOLN: 0.5000 mg | Freq: Three times a day (TID) | INTRAMUSCULAR | Status: DC | PRN

## 2012-08-12 MED ORDER — FENTANYL CITRATE 0.05 MG/ML IJ SOLN
50.0000 ug | Freq: Once | INTRAMUSCULAR | Status: AC
  Administered 2012-08-12: 17:00:00 via INTRAVENOUS
  Filled 2012-08-12: qty 2

## 2012-08-12 MED ORDER — HYDROMORPHONE HCL PF 1 MG/ML IJ SOLN
1.0000 mg | Freq: Once | INTRAMUSCULAR | Status: AC
  Administered 2012-08-12: 1 mg via INTRAVENOUS
  Filled 2012-08-12: qty 1

## 2012-08-12 MED ORDER — HYDROCORTISONE ACETATE 25 MG RE SUPP
25.0000 mg | Freq: Two times a day (BID) | RECTAL | Status: DC
  Administered 2012-08-13 – 2012-08-19 (×11): 25 mg via RECTAL
  Filled 2012-08-12 (×15): qty 1

## 2012-08-12 NOTE — ED Notes (Signed)
Pt unable to void at this time. 
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
## 2012-08-12 NOTE — ED Notes (Signed)
IV team paged. x4 attempt IV start. x2 Phlebotomy.
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
## 2012-08-12 NOTE — ED Notes (Signed)
Pt is aware of the need for a urine sample. Pt unable to void at this time. 

## 2012-08-12 NOTE — ED Notes (Signed)
Pt c/o of increased pain to the lower abd. Pain 10/10. MD notified.
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
## 2012-08-12 NOTE — ED Notes (Signed)
IV team unable to get IV access. 

## 2012-08-12 NOTE — ED Notes (Signed)
Pt states that she has been here multiple times in the past couple of months with abd pain, n/v. Unable to keep anything down. NAD at this time.

## 2012-08-13 DIAGNOSIS — N390 Urinary tract infection, site not specified: Secondary | ICD-10-CM | POA: Diagnosis present

## 2012-08-13 LAB — PROTIME-INR
INR: 1.03 (ref 0.00–1.49)
Prothrombin Time: 13.4 seconds (ref 11.6–15.2)

## 2012-08-13 LAB — URINE MICROSCOPIC-ADD ON

## 2012-08-13 LAB — COMPREHENSIVE METABOLIC PANEL
ALT: 26 U/L (ref 0–35)
AST: 35 U/L (ref 0–37)
Albumin: 2.4 g/dL — ABNORMAL LOW (ref 3.5–5.2)
Alkaline Phosphatase: 228 U/L — ABNORMAL HIGH (ref 39–117)
BUN: 40 mg/dL — ABNORMAL HIGH (ref 6–23)
CO2: 21 mEq/L (ref 19–32)
Calcium: 8.8 mg/dL (ref 8.4–10.5)
Chloride: 91 mEq/L — ABNORMAL LOW (ref 96–112)
Creatinine, Ser: 1.1 mg/dL (ref 0.50–1.10)
GFR calc Af Amer: 57 mL/min — ABNORMAL LOW (ref >90)
GFR calc non Af Amer: 49 mL/min — ABNORMAL LOW (ref >90)
Glucose, Bld: 106 mg/dL — ABNORMAL HIGH (ref 70–99)
Potassium: 3.5 mEq/L (ref 3.5–5.1)
Sodium: 130 mEq/L — ABNORMAL LOW (ref 135–145)
Total Bilirubin: 0.7 mg/dL (ref 0.3–1.2)
Total Protein: 8.3 g/dL (ref 6.0–8.3)

## 2012-08-13 LAB — URINALYSIS, ROUTINE W REFLEX MICROSCOPIC
Bilirubin Urine: NEGATIVE
Glucose, UA: NEGATIVE mg/dL
Hgb urine dipstick: NEGATIVE
Ketones, ur: NEGATIVE mg/dL
Nitrite: NEGATIVE
Protein, ur: NEGATIVE mg/dL
Specific Gravity, Urine: 1.029 (ref 1.005–1.030)
Urobilinogen, UA: 1 mg/dL (ref 0.0–1.0)
pH: 6 (ref 5.0–8.0)

## 2012-08-13 LAB — CBC WITH DIFFERENTIAL/PLATELET
Basophils Absolute: 0 10*3/uL (ref 0.0–0.1)
Basophils Relative: 0 % (ref 0–1)
Eosinophils Absolute: 0 10*3/uL (ref 0.0–0.7)
Eosinophils Relative: 0 % (ref 0–5)
HCT: 35 % — ABNORMAL LOW (ref 36.0–46.0)
Hemoglobin: 11.9 g/dL — ABNORMAL LOW (ref 12.0–15.0)
Lymphocytes Relative: 11 % — ABNORMAL LOW (ref 12–46)
Lymphs Abs: 2 10*3/uL (ref 0.7–4.0)
MCH: 26 pg (ref 26.0–34.0)
MCHC: 34 g/dL (ref 30.0–36.0)
MCV: 76.6 fL — ABNORMAL LOW (ref 78.0–100.0)
Monocytes Absolute: 1.4 10*3/uL — ABNORMAL HIGH (ref 0.1–1.0)
Monocytes Relative: 8 % (ref 3–12)
Neutro Abs: 14.5 10*3/uL — ABNORMAL HIGH (ref 1.7–7.7)
Neutrophils Relative %: 81 % — ABNORMAL HIGH (ref 43–77)
Platelets: 585 10*3/uL — ABNORMAL HIGH (ref 150–400)
RBC: 4.57 MIL/uL (ref 3.87–5.11)
RDW: 14.4 % (ref 11.5–15.5)
WBC: 17.9 10*3/uL — ABNORMAL HIGH (ref 4.0–10.5)

## 2012-08-13 LAB — PHOSPHORUS: Phosphorus: 2.8 mg/dL (ref 2.3–4.6)

## 2012-08-13 LAB — APTT: aPTT: 29 seconds (ref 24–37)

## 2012-08-13 LAB — MAGNESIUM: Magnesium: 2.6 mg/dL — ABNORMAL HIGH (ref 1.5–2.5)

## 2012-08-13 LAB — GLUCOSE, CAPILLARY: Glucose-Capillary: 164 mg/dL — ABNORMAL HIGH (ref 70–99)

## 2012-08-13 MED ORDER — SODIUM CHLORIDE 0.9 % IV SOLN
INTRAVENOUS | Status: DC
  Administered 2012-08-13 – 2012-08-14 (×3): via INTRAVENOUS
  Administered 2012-08-15: 125 mL/h via INTRAVENOUS
  Administered 2012-08-16 – 2012-08-17 (×5): via INTRAVENOUS
  Administered 2012-08-18 – 2012-08-19 (×2): 75 mL/h via INTRAVENOUS
  Administered 2012-08-19: via INTRAVENOUS

## 2012-08-13 MED ORDER — FREE WATER
30.0000 mL | Freq: Four times a day (QID) | Status: DC
  Administered 2012-08-13 – 2012-08-19 (×23): 30 mL

## 2012-08-13 MED ORDER — POTASSIUM CHLORIDE 10 MEQ/100ML IV SOLN
10.0000 meq | INTRAVENOUS | Status: AC
  Administered 2012-08-13 (×3): 10 meq via INTRAVENOUS
  Filled 2012-08-13 (×3): qty 100

## 2012-08-13 MED ORDER — OSMOLITE 1.2 CAL PO LIQD
1000.0000 mL | ORAL | Status: DC
  Administered 2012-08-13 – 2012-08-19 (×4): 1000 mL
  Filled 2012-08-13 (×9): qty 1000
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
## 2012-08-13 NOTE — Progress Notes (Signed)
[facility] PROGRESS NOTE  Emily [name] NFA:[id number] DOB: 05/28/1940 DOA: 08/12/2012 PCP: Dorrene German, MD  Assessment/Plan: #1 abdominal pain, nausea or vomiting Questionable etiology. May be secondary to urinary tract infection versus chronic gastroparesis. Patient still nauseous with suprapubic abdominal pain. Lipase levels within normal limits. Urine cultures are pending. Continue empiric IV Zosyn. Continue IV fluids. Continue IV Reglan. Continue supportive care. GI has been consulted and appreciate input and recommendations. Follow.  #2 urinary tract infection  Urine cultures are pending. Continue IV Zosyn.  #3 aspiration pneumonia Secondary to problem #1. Chest x-ray with right upper lobe pneumonia and loculated fluid in right lower lobe. Patient is currently afebrile. Leukocytosis is slowly trending down. Continue empiric IV vancomycin and Zosyn. Repeat CXR in few days if no improvement may consider ct chest or [place] for further evaluation. Follow.  #4 hypokalemia Replete.   #5 dehydration IV fluids.  #6 gastroparesis Continue IV Reglan.  #7 type 2 diabetes Hemoglobin A1c is pending. CBGs operating room 106-140. We'll place on sliding scale insulin. Follow.  #8 acute renal failure Secondary to prerenal azotemia. Improved with IV fluids.  #9 hyponatremia Secondary to hypovolemic hyponatremia. Increase IV fluids.  #10 gastroesophageal reflux disease PPI.  #11 depression/anxiety Ativan as needed.  #12 leukocytosis Likely secondary to aspiration pneumonia and urinary tract infection. Urine cultures are pending. Blood cultures are pending. Continue IV vancomycin and Zosyn. Follow.  #13 anemia H&H stable. Follow.  #14 prophylaxis PPI for GI prophylaxis. Lovenox for DVT prophylaxis.  Code Status: Full Family Communication: Updated patient, no family present. Disposition Plan: Home when medically stable   Consultants:  GI: Dr Elnoria Howard  Procedures:  Acute  abdominal series 08/12/12  Antibiotics:  IV Vancomycin 08/13/12  IV Zosyn 08/13/12  HPI/Subjective: Patient c/o nausea and abdominal pain.  Objective: Filed Vitals:   08/12/12 2100 08/12/12 2317 08/12/12 2320 08/13/12 0600  BP: 109/77  129/84 120/79  Pulse: 106  103 105  Temp:   98.3 F (36.8 C) 98.3 F (36.8 C)  TempSrc:   Oral Oral  Resp:      Height:   5\' 4"  (1.626 m)   Weight:   66 kg (145 lb 8.1 oz) 67.268 kg (148 lb 4.8 oz)  SpO2: 100% 100% 100% 97%    Intake/Output Summary (Last 24 hours) at 08/13/12 0931 Last data filed at 08/13/12 0600  Gross per 24 hour  Intake 551.67 ml  Output      0 ml  Net 551.67 ml   Filed Weights   08/12/12 2320 08/13/12 0600  Weight: 66 kg (145 lb 8.1 oz) 67.268 kg (148 lb 4.8 oz)    Exam:   General:  NAD  Cardiovascular: RRR  Respiratory: CTAB  Abdomen: Soft/NT/ND/TTp in suprapubic region  Extremities: No c/c/e  Data Reviewed: Basic Metabolic Panel:  Recent Labs Lab 08/12/12 1806 08/13/12 0015  NA 130* 130*  K 3.1* 3.5  CL 93* 91*  CO2 22 21  GLUCOSE 140* 106*  BUN 41* 40*  CREATININE 1.20* 1.10  CALCIUM 8.4 8.8  MG  --  2.6*  PHOS  --  2.8   Liver Function Tests:  Recent Labs Lab 08/09/12 1511 08/12/12 1806 08/13/12 0015  AST 27 30 35  ALT 23 23 26   ALKPHOS 198* 208* 228*  BILITOT 0.9 0.6 0.7  PROT 8.2 7.3 8.3  ALBUMIN 2.5* 2.2* 2.4*    Recent Labs Lab 08/09/12 1511 08/12/12 1806  LIPASE 56.0 53  AMYLASE 49  --  No results found for this basename: AMMONIA,  in the last 168 hours CBC:  Recent Labs Lab 08/12/12 1806 08/13/12 0015  WBC 19.2* 17.9*  NEUTROABS 16.8* 14.5*  HGB 11.3* 11.9*  HCT 34.0* 35.0*  MCV 77.4* 76.6*  PLT 485* 585*   Cardiac Enzymes: No results found for this basename: CKTOTAL, CKMB, CKMBINDEX, TROPONINI,  in the last 168 hours BNP (last 3 results) No results found for this basename: PROBNP,  in the last 8760 hours CBG:  Recent Labs Lab 08/13/12 0732   GLUCAP 164*    No results found for this or any previous visit (from the past 240 hour(s)).   Studies: Dg Chest Port 1 View  08/12/2012  *RADIOLOGY REPORT*  Clinical Data: Possible pneumonia  PORTABLE CHEST - 1 VIEW  Comparison: 08/12/2012 at 1533 hours  Findings: Focal opacity overlying the right lower lung.  The appearance favors loculated fluid in the right major fissure, although pneumonia is not excluded.  Mild patchy right upper lobe opacity, possibly reflecting pneumonia.  No pleural effusion or pneumothorax.  The heart is normal in size.  Cholecystectomy.  IMPRESSION: Mild patchy right upper lobe opacity, possibly reflecting pneumonia.  Focal opacity overlying the right lower lung, favored to reflect loculated fluid, pneumonia not excluded.   Original Report Authenticated By: Charline Bills, M.D.    Dg Abd Acute W/chest  08/12/2012  *RADIOLOGY REPORT*  Clinical Data: Upper abdominal pain with shortness of breath and productive cough.  ACUTE ABDOMEN SERIES (ABDOMEN 2 VIEW & CHEST 1 VIEW)  Comparison: CT abdomen 07/02/2012 and acute abdominal series 06/10/2012.  Findings: Frontal view of the chest shows midline trachea and normal heart size.  There is an ovoid and smoothly marginated opacity at the right lung base.  Lungs are low in volume but otherwise clear.  Old right rib fractures are noted.  Two views of the abdomen show a percutaneous jejunostomy tube which appears kinked proximally.  Postoperative changes are seen in the upper abdomen.  Bowel gas pattern is unremarkable.  There may be thickening of the ascending colon wall.  IMPRESSION:  1.  Possible loculated fluid in the right major fissure versus right basilar airspace disease. 2.  Question thickening of the wall of the ascending colon.   Original Report Authenticated By: Leanna Battles, M.D.     Scheduled Meds: . dextrose 5 % and 0.45% NaCl   Intravenous STAT  . enoxaparin (LOVENOX) injection  40 mg Subcutaneous QHS  . free water   30 mL Per Tube Q6H  . hydrocortisone  25 mg Rectal BID  . metoCLOPramide (REGLAN) injection  10 mg Intravenous Q8H  . pantoprazole (PROTONIX) IV  40 mg Intravenous Q12H  . piperacillin-tazobactam (ZOSYN)  IV  3.375 g Intravenous Q8H  . potassium chloride  10 mEq Intravenous Q1 Hr x 3  . sucralfate  1 g Per Tube TID WC & HS  . vancomycin  1,000 mg Intravenous Q24H   Continuous Infusions: . sodium chloride 100 mL/hr at 08/13/12 0819  . feeding supplement (OSMOLITE 1.2 CAL)      Principal Problem:   Abdominal pain, epigastric Active Problems:   DM, UNCOMPLICATED, TYPE II   HYPOKALEMIA   ANEMIA, IRON DEFICIENCY   DISORDER, DEPRESSIVE NEC   HYPERTENSION   GERD   Gastroparesis   Nausea with vomiting   Leukocytosis   Hyponatremia   Acute kidney failure   Aspiration pneumonia   UTI (urinary tract infection)    Time spent: > 35 mins  [facility]  [facility] Pager [phone]. If 7PM-7AM, please contact night-coverage at [email or web address], password [email or web address] 08/13/2012, 9:31 AM  LOS: 1 day

## 2012-08-13 NOTE — Progress Notes (Signed)
Pt admitted from ED. Pt transferred from stretcher to bed. Pt's vtial signs stable. Pt complains of 10 out 10 ordered pain medication given. Pt viewed pt safety video. No family at the bedside. Pt has no questions or concerns. New orders initiated.
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
## 2012-08-13 NOTE — Progress Notes (Signed)
PT Cancellation Note  Patient Details Name: Emily [name] MRN: [id number] DOB: [birth year]   Cancelled Treatment:    Reason Eval/Treat Not Completed: Fatigue/lethargy limiting ability to participate;Pain limiting ability to participate Pt declined eval x2 today.  Will check back tomorrow for eval.   Dai Mcadams,KATHrine E 08/13/2012, 3:40 PM Pager: [phone]

## 2012-08-14 LAB — BASIC METABOLIC PANEL
BUN: 20 mg/dL (ref 6–23)
CO2: 21 mEq/L (ref 19–32)
Calcium: 8 mg/dL — ABNORMAL LOW (ref 8.4–10.5)
Chloride: 97 mEq/L (ref 96–112)
Creatinine, Ser: 0.66 mg/dL (ref 0.50–1.10)
GFR calc Af Amer: >90 mL/min (ref >90)
GFR calc non Af Amer: 86 mL/min — ABNORMAL LOW (ref >90)
Glucose, Bld: 136 mg/dL — ABNORMAL HIGH (ref 70–99)
Potassium: 3.6 mEq/L (ref 3.5–5.1)
Sodium: 128 mEq/L — ABNORMAL LOW (ref 135–145)

## 2012-08-14 LAB — CBC
HCT: 28.7 % — ABNORMAL LOW (ref 36.0–46.0)
Hemoglobin: 9.5 g/dL — ABNORMAL LOW (ref 12.0–15.0)
MCH: 25.5 pg — ABNORMAL LOW (ref 26.0–34.0)
MCHC: 32.8 g/dL (ref 30.0–36.0)
MCV: 78 fL (ref 78.0–100.0)
Platelets: 532 10*3/uL — ABNORMAL HIGH (ref 150–400)
RBC: 3.68 MIL/uL — ABNORMAL LOW (ref 3.87–5.11)
RDW: 14.5 % (ref 11.5–15.5)
WBC: 12.9 10*3/uL — ABNORMAL HIGH (ref 4.0–10.5)

## 2012-08-14 LAB — URINE CULTURE: Colony Count: 85000

## 2012-08-14 LAB — HEMOGLOBIN A1C
Hgb A1c MFr Bld: 5.7 % — ABNORMAL HIGH (ref <5.7)
Mean Plasma Glucose: 117 mg/dL — ABNORMAL HIGH (ref <117)

## 2012-08-14 LAB — OSMOLALITY: Osmolality: 284 mOsm/kg (ref 275–300)

## 2012-08-14 LAB — MAGNESIUM: Magnesium: 1.9 mg/dL (ref 1.5–2.5)

## 2012-08-14 LAB — GLUCOSE, CAPILLARY: Glucose-Capillary: 156 mg/dL — ABNORMAL HIGH (ref 70–99)

## 2012-08-14 MED ORDER — VANCOMYCIN HCL IN DEXTROSE 750-5 MG/150ML-% IV SOLN
750.0000 mg | Freq: Two times a day (BID) | INTRAVENOUS | Status: DC
  Administered 2012-08-14 – 2012-08-16 (×6): 750 mg via INTRAVENOUS
  Filled 2012-08-14 (×6): qty 150

## 2012-08-14 NOTE — Progress Notes (Signed)
[facility] PROGRESS NOTE  Emily [name] OZH:[id number] DOB: Dec 29, 1940 DOA: 08/12/2012 PCP: Dorrene German, MD  Assessment/Plan: #1 abdominal pain, nausea or vomiting Questionable etiology. May be secondary to urinary tract infection versus chronic gastroparesis. Patient still nauseous with suprapubic abdominal pain. Lipase levels within normal limits. Urine cultures are pending. Continue empiric IV Zosyn. Continue IV fluids. Continue IV Reglan. Continue supportive care. MRCP has been ordered per GI and is pending. GI following and appreciate input and recommendations. Follow.  #2 urinary tract infection  Urine cultures are pending. Continue IV Zosyn.  #3 aspiration pneumonia Secondary to problem #1. Chest x-ray with right upper lobe pneumonia and loculated fluid in right lower lobe. Patient is currently afebrile. Leukocytosis is slowly trending down. Continue empiric IV vancomycin and Zosyn. Repeat CXR in few days if no improvement may consider ct chest or [place] for further evaluation. Follow.  #4 hypokalemia Repleted.   #5 dehydration IV fluids.  #6 gastroparesis Continue IV Reglan.  #7 type 2 diabetes Hemoglobin A1c is pending. CBGs operating room 156-164. Continue sliding scale insulin. Follow.  #8 acute renal failure Secondary to prerenal azotemia. Improved with IV fluids.  #9 hyponatremia Secondary to hypovolemic hyponatremia. Continue IV fluids.  #10 gastroesophageal reflux disease PPI.  #11 depression/anxiety Ativan as needed.  #12 leukocytosis Likely secondary to aspiration pneumonia and urinary tract infection. WBC is trending down. Urine cultures are pending. Blood cultures are pending. Continue IV vancomycin and Zosyn. Follow.  #13 anemia H&H stable. Follow.  #14 prophylaxis PPI for GI prophylaxis. Lovenox for DVT prophylaxis.  Code Status: Full Family Communication: Updated patient, no family present. Disposition Plan: Home when medically stable vs  SNF   Consultants:  GI: Dr Elnoria Howard 08/13/12  Procedures:  Acute abdominal series 08/12/12  MRCP pending  Antibiotics:  IV Vancomycin 08/13/12  IV Zosyn 08/13/12  HPI/Subjective: Patient c/o feeling sick and  abdominal pain.  Objective: Filed Vitals:   08/13/12 1452 08/13/12 2200 08/14/12 0600 08/14/12 1320  BP: 118/79 122/74 111/70 114/75  Pulse: 102 104 107 117  Temp: 98.5 F (36.9 C) 98.7 F (37.1 C) 98.5 F (36.9 C) 98.9 F (37.2 C)  TempSrc: Oral Oral Oral Oral  Resp:  20 20 16   Height:      Weight:   68.04 kg (150 lb)   SpO2: 98% 98% 96% 96%   No intake or output data in the 24 hours ending 08/14/12 1720 Filed Weights   08/12/12 2320 08/13/12 0600 08/14/12 0600  Weight: 66 kg (145 lb 8.1 oz) 67.268 kg (148 lb 4.8 oz) 68.04 kg (150 lb)    Exam:   General:  NAD  Cardiovascular: RRR  Respiratory: CTAB  Abdomen: Soft/NT/ND/TTp in suprapubic region  Extremities: No c/c/e  Data Reviewed: Basic Metabolic Panel:  Recent Labs Lab 08/12/12 1806 08/13/12 0015 08/14/12 0535  NA 130* 130* 128*  K 3.1* 3.5 3.6  CL 93* 91* 97  CO2 22 21 21   GLUCOSE 140* 106* 136*  BUN 41* 40* 20  CREATININE 1.20* 1.10 0.66  CALCIUM 8.4 8.8 8.0*  MG  --  2.6* 1.9  PHOS  --  2.8  --    Liver Function Tests:  Recent Labs Lab 08/09/12 1511 08/12/12 1806 08/13/12 0015  AST 27 30 35  ALT 23 23 26   ALKPHOS 198* 208* 228*  BILITOT 0.9 0.6 0.7  PROT 8.2 7.3 8.3  ALBUMIN 2.5* 2.2* 2.4*    Recent Labs Lab 08/09/12 1511 08/12/12 1806  LIPASE 56.0 53  AMYLASE 49  --    No results found for this basename: AMMONIA,  in the last 168 hours CBC:  Recent Labs Lab 08/12/12 1806 08/13/12 0015 08/14/12 0535  WBC 19.2* 17.9* 12.9*  NEUTROABS 16.8* 14.5*  --   HGB 11.3* 11.9* 9.5*  HCT 34.0* 35.0* 28.7*  MCV 77.4* 76.6* 78.0  PLT 485* 585* 532*   Cardiac Enzymes: No results found for this basename: CKTOTAL, CKMB, CKMBINDEX, TROPONINI,  in the last 168  hours BNP (last 3 results) No results found for this basename: PROBNP,  in the last 8760 hours CBG:  Recent Labs Lab 08/13/12 0732 08/14/12 0735  GLUCAP 164* 156*    Recent Results (from the past 240 hour(s))  URINE CULTURE     Status: None   Collection Time    08/12/12 10:12 PM      Result Value Range Status   Specimen Description URINE, CLEAN CATCH   Final   Special Requests NONE   Final   Culture  Setup Time 08/13/2012 12:06   Final   Colony Count 85,000 COLONIES/ML   Final   Culture     Final   Value: Multiple bacterial morphotypes present, none predominant. Suggest appropriate recollection if clinically indicated.   Report Status 08/14/2012 FINAL   Final     Studies: Dg Chest Port 1 View  08/12/2012  *RADIOLOGY REPORT*  Clinical Data: Possible pneumonia  PORTABLE CHEST - 1 VIEW  Comparison: 08/12/2012 at 1533 hours  Findings: Focal opacity overlying the right lower lung.  The appearance favors loculated fluid in the right major fissure, although pneumonia is not excluded.  Mild patchy right upper lobe opacity, possibly reflecting pneumonia.  No pleural effusion or pneumothorax.  The heart is normal in size.  Cholecystectomy.  IMPRESSION: Mild patchy right upper lobe opacity, possibly reflecting pneumonia.  Focal opacity overlying the right lower lung, favored to reflect loculated fluid, pneumonia not excluded.   Original Report Authenticated By: Charline Bills, M.D.     Scheduled Meds: . enoxaparin (LOVENOX) injection  40 mg Subcutaneous QHS  . free water  30 mL Per Tube Q6H  . hydrocortisone  25 mg Rectal BID  . metoCLOPramide (REGLAN) injection  10 mg Intravenous Q8H  . pantoprazole (PROTONIX) IV  40 mg Intravenous Q12H  . piperacillin-tazobactam (ZOSYN)  IV  3.375 g Intravenous Q8H  . sucralfate  1 g Per Tube TID WC & HS  . vancomycin  750 mg Intravenous Q12H   Continuous Infusions: . sodium chloride 125 mL/hr at 08/14/12 1551  . feeding supplement (OSMOLITE 1.2  CAL) 1,000 mL (08/14/12 1555)    Principal Problem:   Abdominal pain, epigastric Active Problems:   DM, UNCOMPLICATED, TYPE II   HYPOKALEMIA   ANEMIA, IRON DEFICIENCY   DISORDER, DEPRESSIVE NEC   HYPERTENSION   GERD   Gastroparesis   Nausea with vomiting   Leukocytosis   Hyponatremia   Acute kidney failure   Aspiration pneumonia   UTI (urinary tract infection)    Time spent: > 35 mins    [facility]  [facility] Pager [phone]. If 7PM-7AM, please contact night-coverage at [email or web address], password [facility] 08/14/2012, 5:20 PM  LOS: 2 days

## 2012-08-14 NOTE — Progress Notes (Signed)
ANTIBIOTIC CONSULT NOTE - FOLLOW UP  Pharmacy Consult for Vancomycin/Zosyn Indication: Pneumonia, UTI  No Known Allergies  Patient Measurements: Height: 5\' 4"  (162.6 cm) Weight: 150 lb (68.04 kg) IBW/kg (Calculated) : 54.7  Vital Signs: Temp: 98.5 F (36.9 C) (04/13 0600) Temp src: Oral (04/13 0600) BP: 111/70 mmHg (04/13 0600) Pulse Rate: 107 (04/13 0600) Intake/Output from previous day: 04/12 0701 - 04/13 0700 In: 834.6 [P.O.:60; I.V.:674.6; IV Piggyback:100] Out: 300 [Urine:300] Intake/Output from this shift:    Labs:  Recent Labs  08/12/12 1806 08/13/12 0015 08/14/12 0535  WBC 19.2* 17.9* 12.9*  HGB 11.3* 11.9* 9.5*  PLT 485* 585* 532*  CREATININE 1.20* 1.10 0.66   Estimated Creatinine Clearance: 60.2 ml/min (by C-G formula based on Cr of 0.66).  72 ml/min/1.95m2 (normalized)  Microbiology: No results found for this or any previous visit (from the past 720 hour(s)).  Anti-infectives   Start     Dose/Rate Route Frequency Ordered Stop   08/13/12 2100  vancomycin (VANCOCIN) IVPB 1000 mg/200 mL premix     1,000 mg 200 mL/hr over 60 Minutes Intravenous Every 24 hours 08/12/12 2354     08/13/12 0500  piperacillin-tazobactam (ZOSYN) IVPB 3.375 g     3.375 g 12.5 mL/hr over 240 Minutes Intravenous Every 8 hours 08/12/12 2354     08/12/12 2130  vancomycin (VANCOCIN) IVPB 1000 mg/200 mL premix     1,000 mg 200 mL/hr over 60 Minutes Intravenous  Once 08/12/12 1954 08/12/12 2209   08/12/12 2100  piperacillin-tazobactam (ZOSYN) IVPB 3.375 g     3.375 g 100 mL/hr over 30 Minutes Intravenous  Once 08/12/12 1954 08/12/12 2104      Assessment: 72 year old female presents with abdominal pain, nausea/vomiting - UTI vs gastroparesis. CXR shows RUL pneumonia, possible aspiration, thus started on broad spectrum antibiotics on admission 4/11.  Day #3 Vanc and Zosyn  Afebrile, WBC remain elevated but improving  No cultures available  SCr continues to improve,  therefore, will empirically increase vanc based on CrCl ~70 ml/min   Goal of Therapy:  Vancomycin trough level 15-20 mcg/ml Zosyn dose per renal function  Plan:   Increase Vancomycin to 750mg  IV q12h given improved renal function Check trough at steady state  Continue Zosyn 3.375gm IV q8h (4hr extended infusions) Follow up renal function & cultures, de-escalation of therapy  Loralee Pacas, PharmD, BCPS Pager: [phone] 08/14/2012,10:51 AM

## 2012-08-14 NOTE — Evaluation (Signed)
Physical Therapy Evaluation Patient Details Name: Emily [name] MRN: [id number] DOB: 02-25-1941 Today's Date: 08/14/2012 Time: 9528-4132 PT Time Calculation (min): 17 min  PT Assessment / Plan / Recommendation Clinical Impression  Pt is a 72 year old female admitted with abdominal pain, nausea or vomiting with plans for possible MRCP per GI note.  Pt would benefit from acute PT services in order to improve independence with transfers and ambulation to prepare for d/c to next venue.      PT Assessment  Patient needs continued PT services    Follow Up Recommendations  SNF    Does the patient have the potential to tolerate intense rehabilitation      Barriers to Discharge        Equipment Recommendations  Rolling walker with 5" wheels    Recommendations for Other Services     Frequency Min 3X/week    Precautions / Restrictions Precautions Precautions: Fall Precaution Comments: g-tube   Pertinent Vitals/Pain Pt reports pain no better than yesterday but agreeable to ambulate.      Mobility  Bed Mobility Bed Mobility: Supine to Sit Supine to Sit: 4: Min assist Details for Bed Mobility Assistance: assist for trunk Transfers Transfers: Sit to Stand;Stand to Sit Sit to Stand: 4: Min assist;With upper extremity assist;From chair/3-in-1 Stand to Sit: 4: Min assist;With upper extremity assist;To bed;To chair/3-in-1 Details for Transfer Assistance: verbal cues for safety, managing lines, pt held out hands for 2 HHA Ambulation/Gait Ambulation/Gait Assistance: 4: Min guard;4: Min assist Ambulation Distance (Feet): 120 Feet Assistive device: Rolling walker Ambulation/Gait Assistance Details: pt ambulated into bathroom with 2 HHA very unsteady requiring min assist so given RW for hallway ambulation with improvement in steadiness, verbal cues for safe use of RW Gait Pattern: Step-through pattern;Trunk flexed;Decreased stride length Gait velocity: decreased    Exercises     PT  Diagnosis: Difficulty walking  PT Problem List: Decreased strength;Decreased activity tolerance;Decreased mobility;Decreased balance;Decreased safety awareness;Decreased knowledge of use of DME PT Treatment Interventions: DME instruction;Gait training;Functional mobility training;Therapeutic activities;Therapeutic exercise;Patient/family education   PT Goals Acute Rehab PT Goals PT Goal Formulation: With patient Time For Goal Achievement: 08/28/12 Potential to Achieve Goals: Good Pt will go Supine/Side to Sit: with supervision PT Goal: Supine/Side to Sit - Progress: Goal set today Pt will go Sit to Stand: with supervision PT Goal: Sit to Stand - Progress: Goal set today Pt will go Stand to Sit: with supervision PT Goal: Stand to Sit - Progress: Goal set today Pt will Ambulate: >150 feet;with supervision;with least restrictive assistive device PT Goal: Ambulate - Progress: Goal set today Pt will Perform Home Exercise Program: with supervision, verbal cues required/provided PT Goal: Perform Home Exercise Program - Progress: Goal set today  Visit Information  Last PT Received On: 08/14/12 Assistance Needed: +1    Subjective Data  Subjective: I gotta go to the bathroom.   Prior Functioning  Home Living Home Adaptive Equipment: Straight cane Additional Comments: Pt not forthcoming about home living but stated she is home alone sometimes and has SPC. Prior Function Level of Independence: Independent Communication Communication: No difficulties    Cognition  Cognition Overall Cognitive Status: Impaired Area of Impairment: Safety/judgement Arousal/Alertness: Awake/alert Orientation Level: Appears intact for tasks assessed Behavior During Session: Flat affect Safety/Judgement: Decreased safety judgement for tasks assessed;Decreased awareness of need for assistance;Impulsive    Extremity/Trunk Assessment Right Upper Extremity Assessment RUE ROM/Strength/Tone: [facility] for tasks  assessed Left Upper Extremity Assessment LUE ROM/Strength/Tone: [facility] for tasks assessed Right Lower  Extremity Assessment RLE ROM/Strength/Tone: [facility] for tasks assessed Left Lower Extremity Assessment LLE ROM/Strength/Tone: [facility] for tasks assessed   Balance    End of Session PT - End of Session Activity Tolerance: Patient tolerated treatment well Patient left: in bed;with call bell/phone within reach;with bed alarm set  GP     Emily [name],Emily [name] 08/14/2012, 1:53 PM Zenovia Jarred, PT, DPT 08/14/2012 Pager: [phone]

## 2012-08-14 NOTE — Progress Notes (Signed)
Subjective: "I just feel bad, just feel bad."  Objective: Vital signs in last 24 hours: Temp:  [98.5 F (36.9 C)-98.7 F (37.1 C)] 98.5 F (36.9 C) (04/13 0600) Pulse Rate:  [102-107] 107 (04/13 0600) Resp:  [20] 20 (04/13 0600) BP: (111-122)/(70-79) 111/70 mmHg (04/13 0600) SpO2:  [96 %-98 %] 96 % (04/13 0600) Weight:  [150 lb (68.04 kg)] 150 lb (68.04 kg) (04/13 0600) Last BM Date: 08/12/12  Intake/Output from previous day: 04/12 0701 - 04/13 0700 In: 834.6 [P.O.:60; I.V.:674.6; IV Piggyback:100] Out: 300 [Urine:300] Intake/Output this shift:    General appearance: alert and no distress GI: soft, non-tender; bowel sounds normal; no masses,  no organomegaly  Lab Results:  Recent Labs  08/12/12 1806 08/13/12 0015 08/14/12 0535  WBC 19.2* 17.9* 12.9*  HGB 11.3* 11.9* 9.5*  HCT 34.0* 35.0* 28.7*  PLT 485* 585* 532*   BMET  Recent Labs  08/12/12 1806 08/13/12 0015 08/14/12 0535  NA 130* 130* 128*  K 3.1* 3.5 3.6  CL 93* 91* 97  CO2 22 21 21   GLUCOSE 140* 106* 136*  BUN 41* 40* 20  CREATININE 1.20* 1.10 0.66  CALCIUM 8.4 8.8 8.0*   LFT  Recent Labs  08/13/12 0015  PROT 8.3  ALBUMIN 2.4*  AST 35  ALT 26  ALKPHOS 228*  BILITOT 0.7   PT/INR  Recent Labs  08/13/12 0015  LABPROT 13.4  INR 1.03   Hepatitis Panel No results found for this basename: HEPBSAG, HCVAB, HEPAIGM, HEPBIGM,  in the last 72 hours C-Diff No results found for this basename: CDIFFTOX,  in the last 72 hours Fecal Lactopherrin No results found for this basename: FECLLACTOFRN,  in the last 72 hours  Studies/Results: Dg Chest Port 1 View  08/12/2012  *RADIOLOGY REPORT*  Clinical Data: Possible pneumonia  PORTABLE CHEST - 1 VIEW  Comparison: 08/12/2012 at 1533 hours  Findings: Focal opacity overlying the right lower lung.  The appearance favors loculated fluid in the right major fissure, although pneumonia is not excluded.  Mild patchy right upper lobe opacity, possibly  reflecting pneumonia.  No pleural effusion or pneumothorax.  The heart is normal in size.  Cholecystectomy.  IMPRESSION: Mild patchy right upper lobe opacity, possibly reflecting pneumonia.  Focal opacity overlying the right lower lung, favored to reflect loculated fluid, pneumonia not excluded.   Original Report Authenticated By: Charline Bills, M.D.    Dg Abd Acute W/chest  08/12/2012  *RADIOLOGY REPORT*  Clinical Data: Upper abdominal pain with shortness of breath and productive cough.  ACUTE ABDOMEN SERIES (ABDOMEN 2 VIEW & CHEST 1 VIEW)  Comparison: CT abdomen 07/02/2012 and acute abdominal series 06/10/2012.  Findings: Frontal view of the chest shows midline trachea and normal heart size.  There is an ovoid and smoothly marginated opacity at the right lung base.  Lungs are low in volume but otherwise clear.  Old right rib fractures are noted.  Two views of the abdomen show a percutaneous jejunostomy tube which appears kinked proximally.  Postoperative changes are seen in the upper abdomen.  Bowel gas pattern is unremarkable.  There may be thickening of the ascending colon wall.  IMPRESSION:  1.  Possible loculated fluid in the right major fissure versus right basilar airspace disease. 2.  Question thickening of the wall of the ascending colon.   Original Report Authenticated By: Leanna Battles, M.D.     Medications:  Scheduled: . enoxaparin (LOVENOX) injection  40 mg Subcutaneous QHS  . free water  30  mL Per Tube Q6H  . hydrocortisone  25 mg Rectal BID  . metoCLOPramide (REGLAN) injection  10 mg Intravenous Q8H  . pantoprazole (PROTONIX) IV  40 mg Intravenous Q12H  . piperacillin-tazobactam (ZOSYN)  IV  3.375 g Intravenous Q8H  . sucralfate  1 g Per Tube TID WC & HS  . vancomycin  1,000 mg Intravenous Q24H   Continuous: . sodium chloride 125 mL/hr at 08/14/12 0518  . feeding supplement (OSMOLITE 1.2 CAL) 1,000 mL (08/14/12 9604)    Assessment/Plan: 1) Nausea/Vomiting.  2)  Gastroparesis.  3) ETOH abuse.  4) Chills. 5) Increasing AP   Objectively she does not appear to be worse than yesterday.  Her vague complaints of feeling badly makes it difficult to discern any true worsening of her symptoms.  She has a long history of chronic abdominal pain.  Her AP is noted to be increasing.  I will order an MRCP, which was Dr. Regino Schultze plan as an outpatient.  Plan: 1) MRCP. 2) Continue supportive care.  LOS: 2 days   Zuma Hust D 08/14/2012, 8:41 AM

## 2012-08-15 ENCOUNTER — Inpatient Hospital Stay (HOSPITAL_COMMUNITY): Payer: Medicare Other

## 2012-08-15 DIAGNOSIS — K838 Other specified diseases of biliary tract: Secondary | ICD-10-CM

## 2012-08-15 DIAGNOSIS — R109 Unspecified abdominal pain: Secondary | ICD-10-CM

## 2012-08-15 DIAGNOSIS — R112 Nausea with vomiting, unspecified: Secondary | ICD-10-CM

## 2012-08-15 DIAGNOSIS — N390 Urinary tract infection, site not specified: Secondary | ICD-10-CM

## 2012-08-15 DIAGNOSIS — R933 Abnormal findings on diagnostic imaging of other parts of digestive tract: Secondary | ICD-10-CM

## 2012-08-15 DIAGNOSIS — R1013 Epigastric pain: Secondary | ICD-10-CM

## 2012-08-15 LAB — CBC
HCT: 29.1 % — ABNORMAL LOW (ref 36.0–46.0)
Hemoglobin: 9.4 g/dL — ABNORMAL LOW (ref 12.0–15.0)
MCH: 25.2 pg — ABNORMAL LOW (ref 26.0–34.0)
MCHC: 32.3 g/dL (ref 30.0–36.0)
MCV: 78 fL (ref 78.0–100.0)
Platelets: 476 10*3/uL — ABNORMAL HIGH (ref 150–400)
RBC: 3.73 MIL/uL — ABNORMAL LOW (ref 3.87–5.11)
RDW: 14.7 % (ref 11.5–15.5)
WBC: 13.4 10*3/uL — ABNORMAL HIGH (ref 4.0–10.5)

## 2012-08-15 LAB — BASIC METABOLIC PANEL
BUN: 14 mg/dL (ref 6–23)
CO2: 21 mEq/L (ref 19–32)
Calcium: 7.6 mg/dL — ABNORMAL LOW (ref 8.4–10.5)
Chloride: 102 mEq/L (ref 96–112)
Creatinine, Ser: 0.7 mg/dL (ref 0.50–1.10)
GFR calc Af Amer: >90 mL/min (ref >90)
GFR calc non Af Amer: 85 mL/min — ABNORMAL LOW (ref >90)
Glucose, Bld: 117 mg/dL — ABNORMAL HIGH (ref 70–99)
Potassium: 3.5 mEq/L (ref 3.5–5.1)
Sodium: 133 mEq/L — ABNORMAL LOW (ref 135–145)

## 2012-08-15 LAB — URINE CULTURE: Colony Count: 2000

## 2012-08-15 LAB — CREATININE, URINE, RANDOM: Creatinine, Urine: 92 mg/dL

## 2012-08-15 LAB — OSMOLALITY, URINE: Osmolality, Ur: 428 mOsm/kg (ref 390–1090)

## 2012-08-15 LAB — GLUCOSE, CAPILLARY: Glucose-Capillary: 98 mg/dL (ref 70–99)

## 2012-08-15 LAB — SODIUM, URINE, RANDOM: Sodium, Ur: <15 mEq/L

## 2012-08-15 MED ORDER — IOHEXOL 300 MG/ML  SOLN
80.0000 mL | Freq: Once | INTRAMUSCULAR | Status: AC | PRN
  Administered 2012-08-15: 80 mL via INTRAVENOUS

## 2012-08-15 MED ORDER — GADOBENATE DIMEGLUMINE 529 MG/ML IV SOLN
14.0000 mL | Freq: Once | INTRAVENOUS | Status: AC | PRN
  Administered 2012-08-15: 14 mL via INTRAVENOUS

## 2012-08-15 MED ORDER — POTASSIUM CHLORIDE 20 MEQ/15ML (10%) PO LIQD
20.0000 meq | Freq: Once | ORAL | Status: AC
  Administered 2012-08-15: 20 meq via ORAL
  Filled 2012-08-15: qty 15

## 2012-08-15 MED ORDER — POTASSIUM CHLORIDE 10 MEQ/100ML IV SOLN
10.0000 meq | INTRAVENOUS | Status: AC
  Administered 2012-08-15: 10 meq via INTRAVENOUS
  Filled 2012-08-15 (×3): qty 100

## 2012-08-15 NOTE — Progress Notes (Signed)
[facility] PROGRESS NOTE  Emily [name] ZOX:[id number] DOB: 03-15-1941 DOA: 08/12/2012 PCP: Dorrene German, MD  Assessment/Plan: #1 abdominal pain, nausea or vomiting Questionable etiology. May be secondary to urinary tract infection versus chronic gastroparesis versus abdominal etiology. Patient still nauseous with suprapubic abdominal pain. Lipase levels within normal limits. Urine cultures are pending. Continue empiric IV Zosyn. Continue IV fluids. Continue IV Reglan. Continue supportive care. MRCP has been ordered per GI and is pending. GI following and appreciate input and recommendations. Follow.  #2 urinary tract infection  Urine cultures are pending. Continue IV Zosyn.  #3 aspiration pneumonia Secondary to problem #1. Chest x-ray with right upper lobe pneumonia and loculated fluid in right lower lobe. Patient is currently afebrile. Leukocytosis is slowly trending down. Continue empiric IV vancomycin and Zosyn. Repeat CXR in few days if no improvement may consider ct chest or [place] for further evaluation. Follow.  #4 hypokalemia Repleted.   #5 dehydration IV fluids.  #6 gastroparesis Continue IV Reglan.  #7 type 2 diabetes Hemoglobin A1c is pending. CBGs 98-156. Continue sliding scale insulin. Follow.  #8 acute renal failure Secondary to prerenal azotemia. Improved with IV fluids.  #9 hyponatremia Secondary to hypovolemic hyponatremia. Continue IV fluids.  #10 gastroesophageal reflux disease PPI.  #11 depression/anxiety Ativan as needed.  #12 leukocytosis Likely secondary to aspiration pneumonia and urinary tract infection. WBC is fluctuating. Urine cultures are pending. Blood cultures are pending. MRCP is pending. Continue IV vancomycin and Zosyn. Follow.  #13 anemia H&H stable. Follow.  #14 prophylaxis PPI for GI prophylaxis. Lovenox for DVT prophylaxis.  Code Status: Full Family Communication: Updated patient, no family present. Disposition Plan: Home  when medically stable vs SNF   Consultants:  GI: Dr Elnoria Howard 08/13/12  Procedures:  Acute abdominal series 08/12/12  MRCP pending  Antibiotics:  IV Vancomycin 08/13/12  IV Zosyn 08/13/12  HPI/Subjective: Patient states she's feeling a little bit better however still complaining of lower  abdominal pain.  Objective: Filed Vitals:   08/14/12 1320 08/14/12 2118 08/15/12 0500 08/15/12 0617  BP: 114/75 102/66  111/72  Pulse: 117 96  105  Temp: 98.9 F (37.2 C) 98.5 F (36.9 C)  98.1 F (36.7 C)  TempSrc: Oral Oral  Oral  Resp: 16 17  17   Height:      Weight:   71.578 kg (157 lb 12.8 oz)   SpO2: 96% 97%  98%   No intake or output data in the 24 hours ending 08/15/12 0934 Filed Weights   08/13/12 0600 08/14/12 0600 08/15/12 0500  Weight: 67.268 kg (148 lb 4.8 oz) 68.04 kg (150 lb) 71.578 kg (157 lb 12.8 oz)    Exam:   General:  NAD  Cardiovascular: RRR  Respiratory: CTAB  Abdomen: Soft/NT/ND/TTp in suprapubic region  Extremities: No c/c/e  Data Reviewed: Basic Metabolic Panel:  Recent Labs Lab 08/12/12 1806 08/13/12 0015 08/14/12 0535 08/15/12 0500  NA 130* 130* 128* 133*  K 3.1* 3.5 3.6 3.5  CL 93* 91* 97 102  CO2 22 21 21 21   GLUCOSE 140* 106* 136* 117*  BUN 41* 40* 20 14  CREATININE 1.20* 1.10 0.66 0.70  CALCIUM 8.4 8.8 8.0* 7.6*  MG  --  2.6* 1.9  --   PHOS  --  2.8  --   --    Liver Function Tests:  Recent Labs Lab 08/09/12 1511 08/12/12 1806 08/13/12 0015  AST 27 30 35  ALT 23 23 26   ALKPHOS 198* 208* 228*  BILITOT 0.9 0.6  0.7  PROT 8.2 7.3 8.3  ALBUMIN 2.5* 2.2* 2.4*    Recent Labs Lab 08/09/12 1511 08/12/12 1806  LIPASE 56.0 53  AMYLASE 49  --    No results found for this basename: AMMONIA,  in the last 168 hours CBC:  Recent Labs Lab 08/12/12 1806 08/13/12 0015 08/14/12 0535 08/15/12 0500  WBC 19.2* 17.9* 12.9* 13.4*  NEUTROABS 16.8* 14.5*  --   --   HGB 11.3* 11.9* 9.5* 9.4*  HCT 34.0* 35.0* 28.7* 29.1*  MCV  77.4* 76.6* 78.0 78.0  PLT 485* 585* 532* 476*   Cardiac Enzymes: No results found for this basename: CKTOTAL, CKMB, CKMBINDEX, TROPONINI,  in the last 168 hours BNP (last 3 results) No results found for this basename: PROBNP,  in the last 8760 hours CBG:  Recent Labs Lab 08/13/12 0732 08/14/12 0735 08/15/12 0802  GLUCAP 164* 156* 98    Recent Results (from the past 240 hour(s))  URINE CULTURE     Status: None   Collection Time    08/12/12 10:12 PM      Result Value Range Status   Specimen Description URINE, CLEAN CATCH   Final   Special Requests NONE   Final   Culture  Setup Time 08/13/2012 12:06   Final   Colony Count 85,000 COLONIES/ML   Final   Culture     Final   Value: Multiple bacterial morphotypes present, none predominant. Suggest appropriate recollection if clinically indicated.   Report Status 08/14/2012 FINAL   Final  URINE CULTURE     Status: None   Collection Time    08/13/12  5:45 PM      Result Value Range Status   Specimen Description URINE, CLEAN CATCH   Final   Special Requests NONE   Final   Culture  Setup Time 08/14/2012 02:05   Final   Colony Count 2,000 COLONIES/ML   Final   Culture INSIGNIFICANT GROWTH   Final   Report Status 08/15/2012 FINAL   Final     Studies: No results found.  Scheduled Meds: . enoxaparin (LOVENOX) injection  40 mg Subcutaneous QHS  . free water  30 mL Per Tube Q6H  . hydrocortisone  25 mg Rectal BID  . metoCLOPramide (REGLAN) injection  10 mg Intravenous Q8H  . pantoprazole (PROTONIX) IV  40 mg Intravenous Q12H  . piperacillin-tazobactam (ZOSYN)  IV  3.375 g Intravenous Q8H  . potassium chloride  10 mEq Intravenous Q1 Hr x 3  . sucralfate  1 g Per Tube TID WC & HS  . vancomycin  750 mg Intravenous Q12H   Continuous Infusions: . sodium chloride 125 mL/hr at 08/14/12 1551  . feeding supplement (OSMOLITE 1.2 CAL) 1,000 mL (08/14/12 1555)    Principal Problem:   Abdominal pain, epigastric Active Problems:   DM,  UNCOMPLICATED, TYPE II   HYPOKALEMIA   ANEMIA, IRON DEFICIENCY   DISORDER, DEPRESSIVE NEC   HYPERTENSION   GERD   Gastroparesis   Nausea with vomiting   Leukocytosis   Hyponatremia   Acute kidney failure   Aspiration pneumonia   UTI (urinary tract infection)    Time spent: > 35 mins    [facility]  [facility] Pager [phone]. If 7PM-7AM, please contact night-coverage at [email or web address], password [facility] 08/15/2012, 9:34 AM  LOS: 3 days

## 2012-08-15 NOTE — Progress Notes (Signed)
Clinical Social Work Department BRIEF PSYCHOSOCIAL ASSESSMENT 08/15/2012  Patient:  Emily [name], Emily [name]     Account Number:  [id number]     Admit date:  08/12/2012  Clinical Social Worker:  Dennison Bulla  Date/Time:  08/15/2012 02:00 PM  Referred by:  Physician  Date Referred:  08/15/2012 Referred for  SNF Placement   Other Referral:   Interview type:  Patient Other interview type:    PSYCHOSOCIAL DATA Living Status:  ALONE Admitted from facility:   Level of care:   Primary support name:  Mary Primary support relationship to patient:  SIBLING Degree of support available:   Adequate    CURRENT CONCERNS Current Concerns  Post-Acute Placement   Other Concerns:    SOCIAL WORK ASSESSMENT / PLAN CSW received referral to assist with dc planning. CSW reviewed chart and met with patient at bedside. No visitors present.    CSW introduced myself and explained role. Patient reports that she was admitted due to stomach pain. Patient currently lives alone but reports that friends and family assist her. Patient reports that she has a dtr that lives in [place] as well. CSW discussed dc plans with patient and patient reports she wants to return home. CSW inquired about patient's safety and her ability to properly care for herself. Patient reports no safety concerns but does report that she falls at home. CSW discussed PT recommendations for SNF placement. CSW explained SNF process and provided SNF list. Patient continues to reports she wants to return home. CSW encouraged patient to review list and CSW would meet with patient tomorrow to discuss plans.   Assessment/plan status:  Psychosocial Support/Ongoing Assessment of Needs Other assessment/ plan:   Information/referral to community resources:   SNF list    PATIENT'S/FAMILY'S RESPONSE TO PLAN OF CARE: Patient alert and oriented. Patient participated in assessment but reports that she is feeling sick and wants to finish assessment at  later time. Patient agreeable to consider SNF placement.

## 2012-08-15 NOTE — Progress Notes (Signed)
PT Cancellation Note  Patient Details Name: Eldoris Beiser MRN: [id number] DOB: 08-Dec-1940   Cancelled Treatment:    Reason Eval/Treat Not Completed: Patient at procedure or test/unavailable. Will follow.   Ralene Bathe Kistler 08/15/2012, 11:40 AM [phone]

## 2012-08-15 NOTE — Progress Notes (Signed)
Patient seen, examined, and I agree with the above documentation, including the assessment and plan. Complicated PMH including gastroparesis s/p gastrectomy with jejuno-esophageal anastomosis, GERD, j-tube in place for feeding, severe esophageal dysmotility, also with CBD dilation with hx of cholecystectomy  Now with abd pain, though somewhat chronic. MRCP today shows CBD dilation to 12 mm without stone, though duct taper abruptly at level of ampulla. Incidentally an area concerning for PNA in the RML along with nodule.  CT chest ordered to look at lungs in dedicated fashion LFTs have been intermittently abnormal, now mostly elevated alk phos, though on 07/02/12 AST/ALT were also elevated in the 150-240 range. All raises the question of papillary stenosis or perhaps biliary sphincter dysfunction, less likely ampullary lesion  However, given complicated anatomy, access to the ampulla would be very difficult and would likely require retrograde ERCP (at tertiary care center such as [facility]).  Other options are EUS (though no sure adequate views of CBD could be obtained with surgical anatomy) or even percutaneous cholangiogram.   Will discuss further with primary GI MD Supportive care

## 2012-08-15 NOTE — Progress Notes (Signed)
[facility] Progress Note  Subjective:  Nausea but no vomiting.  Still has 9/10 abdominal pain.  Says that BM's are moving normally.  Objective:  Vital signs in last 24 hours: Temp:  [98.1 F (36.7 C)-98.9 F (37.2 C)] 98.1 F (36.7 C) (04/14 0617) Pulse Rate:  [96-117] 105 (04/14 0617) Resp:  [16-17] 17 (04/14 0617) BP: (102-114)/(66-75) 111/72 mmHg (04/14 0617) SpO2:  [96 %-98 %] 98 % (04/14 0617) Weight:  [157 lb 12.8 oz (71.578 kg)] 157 lb 12.8 oz (71.578 kg) (04/14 0500) Last BM Date: 08/14/12 General:   Alert, Well-developed, in NAD Heart:  Slightly tachy but regular rhythm; no murmurs Pulm:  CTAB.  No W/R/R. Abdomen:  Soft, nondistended. Normal bowel sounds, without guarding, and without rebound.  Jejunostomy noted.  TTP in upper abdomen > in the LUQ near J-tube.   Extremities:  Without edema. Neurologic:  Alert and  oriented x4;  grossly normal neurologically. Psych:  Alert and cooperative. Normal mood and flat affect.  Lab Results:  Recent Labs  08/13/12 0015 08/14/12 0535 08/15/12 0500  WBC 17.9* 12.9* 13.4*  HGB 11.9* 9.5* 9.4*  HCT 35.0* 28.7* 29.1*  PLT 585* 532* 476*   BMET  Recent Labs  08/13/12 0015 08/14/12 0535 08/15/12 0500  NA 130* 128* 133*  K 3.5 3.6 3.5  CL 91* 97 102  CO2 21 21 21   GLUCOSE 106* 136* 117*  BUN 40* 20 14  CREATININE 1.10 0.66 0.70  CALCIUM 8.8 8.0* 7.6*   LFT  Recent Labs  08/13/12 0015  PROT 8.3  ALBUMIN 2.4*  AST 35  ALT 26  ALKPHOS 228*  BILITOT 0.7   PT/INR  Recent Labs  08/13/12 0015  LABPROT 13.4  INR 1.03    Assessment / Plan: -N/V/Abdominal pain:  These are chronic issues.  Is on protonix IV 40 mg BID, reglan 10 mg IV TID, and carafate TID. -Known gastroparesis:  Has J-tube. 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 *Supportive care.   LOS: 3 days   Felipe Cabell D.  08/15/2012, 9:35 AM  Pager number [phone]

## 2012-08-15 NOTE — Progress Notes (Signed)
Nutrition Brief Note  - Pt reports tolerating TF well over the weekend without any nutritional issues/concerns/complaints. Pt states her home TF regimen is Osmolite 1.2 at 123ml/hr from 10:30pm-3:30am. This provides 750 calories, 35g protein, free water meeting only 53% estimated calorie needs, 50% estimated protein needs. Will continue current TF regimen as pt remains on clear liquid diet and home TF regimen inadequate to meet nutritional needs. Will continue to monitor.   Levon Hedger MS, RD, LDN [phone] Pager [phone] After Hours Pager

## 2012-08-16 ENCOUNTER — Inpatient Hospital Stay (HOSPITAL_COMMUNITY): Payer: Medicare Other

## 2012-08-16 DIAGNOSIS — R7989 Other specified abnormal findings of blood chemistry: Secondary | ICD-10-CM | POA: Diagnosis present

## 2012-08-16 DIAGNOSIS — K3184 Gastroparesis: Principal | ICD-10-CM

## 2012-08-16 DIAGNOSIS — D72829 Elevated white blood cell count, unspecified: Secondary | ICD-10-CM

## 2012-08-16 DIAGNOSIS — J189 Pneumonia, unspecified organism: Secondary | ICD-10-CM | POA: Diagnosis present

## 2012-08-16 DIAGNOSIS — J852 Abscess of lung without pneumonia: Secondary | ICD-10-CM

## 2012-08-16 DIAGNOSIS — K838 Other specified diseases of biliary tract: Secondary | ICD-10-CM | POA: Diagnosis present

## 2012-08-16 LAB — BASIC METABOLIC PANEL
BUN: 7 mg/dL (ref 6–23)
CO2: 24 mEq/L (ref 19–32)
Calcium: 7.5 mg/dL — ABNORMAL LOW (ref 8.4–10.5)
Chloride: 108 mEq/L (ref 96–112)
Creatinine, Ser: 0.54 mg/dL (ref 0.50–1.10)
GFR calc Af Amer: >90 mL/min (ref >90)
GFR calc non Af Amer: >90 mL/min (ref >90)
Glucose, Bld: 116 mg/dL — ABNORMAL HIGH (ref 70–99)
Potassium: 3.4 mEq/L — ABNORMAL LOW (ref 3.5–5.1)
Sodium: 139 mEq/L (ref 135–145)

## 2012-08-16 LAB — GLUCOSE, CAPILLARY: Glucose-Capillary: 102 mg/dL — ABNORMAL HIGH (ref 70–99)

## 2012-08-16 LAB — CBC
HCT: 26.4 % — ABNORMAL LOW (ref 36.0–46.0)
Hemoglobin: 8.6 g/dL — ABNORMAL LOW (ref 12.0–15.0)
MCH: 25.6 pg — ABNORMAL LOW (ref 26.0–34.0)
MCHC: 32.6 g/dL (ref 30.0–36.0)
MCV: 78.6 fL (ref 78.0–100.0)
Platelets: 425 10*3/uL — ABNORMAL HIGH (ref 150–400)
RBC: 3.36 MIL/uL — ABNORMAL LOW (ref 3.87–5.11)
RDW: 15.2 % (ref 11.5–15.5)
WBC: 12.8 10*3/uL — ABNORMAL HIGH (ref 4.0–10.5)

## 2012-08-16 LAB — STREP PNEUMONIAE URINARY ANTIGEN: Strep Pneumo Urinary Antigen: NEGATIVE

## 2012-08-16 MED ORDER — TECHNETIUM TC 99M MEBROFENIN IV KIT
5.1000 | PACK | Freq: Once | INTRAVENOUS | Status: AC | PRN
  Administered 2012-08-16: 5 via INTRAVENOUS

## 2012-08-16 MED ORDER — POTASSIUM CHLORIDE 20 MEQ/15ML (10%) PO LIQD
40.0000 meq | Freq: Once | ORAL | Status: AC
  Administered 2012-08-16: 40 meq
  Filled 2012-08-16: qty 30

## 2012-08-16 NOTE — Progress Notes (Addendum)
Clinical Social Work Department CLINICAL SOCIAL WORK PLACEMENT NOTE 08/16/2012  Patient:  BOBBY, RAGAN  Account Number:  [id number] Admit date:  08/12/2012  Clinical Social Worker:  Unk Lightning, LCSW  Date/time:  08/16/2012 10:30 AM  Clinical Social Work is seeking post-discharge placement for this patient at the following level of care:   SKILLED NURSING   (*CSW will update this form in Epic as items are completed)   08/16/2012  Patient/family provided with [facility] Department of Clinical Social Work's list of facilities offering this level of care within the geographic area requested by the patient (or if unable, by the patient's family).  08/16/2012  Patient/family informed of their freedom to choose among providers that offer the needed level of care, that participate in Medicare, Medicaid or managed care program needed by the patient, have an available bed and are willing to accept the patient.  08/16/2012  Patient/family informed of [facility]' ownership interest in [facility], as well as of the fact that they are under no obligation to receive care at this facility.  PASARR submitted to EDS on existing # PASARR number received from [employer] on   FL2 transmitted to all facilities in geographic area requested by pt/family on  08/16/2012 FL2 transmitted to all facilities within larger geographic area on   Patient informed that his/her managed care company has contracts with or will negotiate with  certain facilities, including the following:     Patient/family informed of bed offers received:  08/17/12 Patient chooses bed at N/A Physician recommends and patient chooses bed at    Patient to be transferred to  N/A on   Patient to be transferred to facility by  N/A  The following physician request were entered in Epic:   Additional Comments:  08/19/12-Patient decided to return home with [facility] and declined SNF
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
## 2012-08-16 NOTE — Progress Notes (Signed)
Clinical Social Work  CSW met with patient at bedside to discuss dc plans again. Patient reports that after speaking with her dtr she feels she might need SNF placement. CSW explained SNF process and provided SNF list. CSW explained Medicare benefits for SNF.   CSW received permission to fax out to [facility]. CSW will follow up tomorrow with bed offers.  [place], [place] [phone]

## 2012-08-16 NOTE — Progress Notes (Signed)
Physical Therapy Treatment Patient Details Name: Emily [name] MRN: [id number] DOB: 10/17/1940 Today's Date: 08/16/2012 Time:  -     PT Assessment / Plan / Recommendation Comments on Treatment Session  Pt.  found with incontinence of B/B. assisted to [facility] Pt then stated that she would walk. After 30 ' became weaker and requested to sit down. Pt. reports 8 abd. pain. had been medicated and RN aware.    Follow Up Recommendations  SNF     Does the patient have the potential to tolerate intense rehabilitation     Barriers to Discharge        Equipment Recommendations  Rolling walker with 5" wheels    Recommendations for Other Services    Frequency Min 3X/week   Plan Discharge plan remains appropriate;Frequency remains appropriate    Precautions / Restrictions Precautions Precautions: Fall Precaution Comments: g-tube, incontinenece.   Pertinent Vitals/Pain Abdominal 8, rn aware, had meds.    Mobility  Bed Mobility Bed Mobility: Sit to Supine Supine to Sit: 4: Min guard;With rails Sit to Supine: 3: Mod assist Details for Bed Mobility Assistance: after pt gotten settled in bed after trip to the [facility], pt stated that she would walk. Pt. assisted out of bed again. Transfers Transfers: Stand Pivot Transfers Sit to Stand: 4: Min assist;From chair/3-in-1;From bed;With armrests;With upper extremity assist Stand to Sit: 4: Min assist;With upper extremity assist;To bed;To chair/3-in-1 Stand Pivot Transfers: 4: Min assist Details for Transfer Assistance: verbal cues for safety,  use of UE's on armrests.  Pt. was min assist from bed and BSC but   required 2 person assistance get up from rolling chair in hallway as pt. requested to sit down while walking in hall due to feeling  weak. Ambulation/Gait Ambulation/Gait Assistance: 3: Mod assist Ambulation Distance (Feet): 30 Feet Assistive device: Rolling walker Ambulation/Gait Assistance Details: cues for RW guidance.  keep inside RW. Pt.  had urgent request to sit down while out in hall. Gait velocity: decreased    Exercises     PT Diagnosis:    PT Problem List:   PT Treatment Interventions:     PT Goals Acute Rehab PT Goals Pt will go Supine/Side to Sit: with supervision PT Goal: Supine/Side to Sit - Progress: Progressing toward goal Pt will go Sit to Stand: with supervision PT Goal: Sit to Stand - Progress: Progressing toward goal Pt will go Stand to Sit: with supervision PT Goal: Stand to Sit - Progress: Progressing toward goal Pt will Ambulate: >150 feet;with supervision;with least restrictive assistive device PT Goal: Ambulate - Progress: Progressing toward goal  Visit Information  Last PT Received On: 08/16/12 Assistance Needed: +1 (+ 2 if walk in hall, pt got weak today.)    Subjective Data  Subjective: I am in pain. I will walk with you.   Cognition  Cognition Overall Cognitive Status: Impaired Arousal/Alertness: Awake/alert Behavior During Session: Flat affect Cognition - Other Comments: pt. initially did not communicate, barely answered questions, progressively more engaged in activity.    Balance     End of Session PT - End of Session Equipment Utilized During Treatment: Gait belt Activity Tolerance: Patient limited by fatigue Patient left: in chair;with call bell/phone within reach Nurse Communication: Mobility status   GP     Rada Hay 08/16/2012, 12:52 PM
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
## 2012-08-16 NOTE — Progress Notes (Signed)
Patient seen, examined, and I agree with the above documentation, including the assessment and plan. New issue is necrotizing pneumonia in right lung seen somewhat incidentally at the time of her MRCP.  Patient to be seen by ID today Her CBD is elevated she has had episodic pain along with fluctuating liver function tests. They were markedly elevated in early March, and now she has an isolated elevated alkaline phosphatase.  One could make a case for biliary SOD (would be type I with pain, abnormal imaging and LFTs), versus papillary stenosis. Her surgical anatomy makes access to the ampulla and bile duct very difficult and she would need a retrograde ERCP to accomplish this. She is established with Dr. [name] at [facility], and my recommendation is for her to return to their care after discharge to consider retrograde ERCP.   HIDA scan will be done today to prove the CBD drains, though I expect it does intermittently, given her bili is normal I did discuss this with Dr. Dickie La who agrees with this plan For now we'll continue jejunal tube feeds, which is the way she maintains nutrition Just severe esophageal dysmotility, again evident on CT scan of the chest Supportive care for abdominal pain, which is somewhat better today.  I expect her necrotizing pneumonia is contributing to her overall ill feeling

## 2012-08-16 NOTE — Progress Notes (Signed)
ANTIBIOTIC CONSULT NOTE - FOLLOW UP  Pharmacy Consult for Vancomycin/Zosyn Indication: Pneumonia, UTI  No Known Allergies  Patient Measurements: Height: 5\' 4"  (162.6 cm) Weight: 157 lb 12.8 oz (71.578 kg) IBW/kg (Calculated) : 54.7  Vital Signs: Temp: 98.5 F (36.9 C) (04/15 0549) Temp src: Oral (04/15 0549) BP: 126/87 mmHg (04/15 0549) Pulse Rate: 118 (04/15 0549) Intake/Output from previous day: 04/14 0701 - 04/15 0700 In: 7975 [I.V.:7625; IV Piggyback:350] Out: -  Intake/Output from this shift: Total I/O In: -  Out: 20 [Urine:20]  Labs:  Recent Labs  08/14/12 0535 08/14/12 1855 08/15/12 0500 08/16/12 0540  WBC 12.9*  --  13.4* 12.8*  HGB 9.5*  --  9.4* 8.6*  PLT 532*  --  476* 425*  LABCREA  --  92.0  --   --   CREATININE 0.66  --  0.70 0.54   Estimated Creatinine Clearance: 61.7 ml/min (by C-G formula based on Cr of 0.54).    Microbiology: Recent Results (from the past 720 hour(s))  URINE CULTURE     Status: None   Collection Time    08/12/12 10:12 PM      Result Value Range Status   Specimen Description URINE, CLEAN CATCH   Final   Special Requests NONE   Final   Culture  Setup Time 08/13/2012 12:06   Final   Colony Count 85,000 COLONIES/ML   Final   Culture     Final   Value: Multiple bacterial morphotypes present, none predominant. Suggest appropriate recollection if clinically indicated.   Report Status 08/14/2012 FINAL   Final  URINE CULTURE     Status: None   Collection Time    08/13/12  5:45 PM      Result Value Range Status   Specimen Description URINE, CLEAN CATCH   Final   Special Requests NONE   Final   Culture  Setup Time 08/14/2012 02:05   Final   Colony Count 2,000 COLONIES/ML   Final   Culture INSIGNIFICANT GROWTH   Final   Report Status 08/15/2012 FINAL   Final    Anti-infectives   Start     Dose/Rate Route Frequency Ordered Stop   08/14/12 1200  vancomycin (VANCOCIN) IVPB 750 mg/150 ml premix     750 mg 150 mL/hr over 60  Minutes Intravenous Every 12 hours 08/14/12 1058     08/13/12 2100  vancomycin (VANCOCIN) IVPB 1000 mg/200 mL premix  Status:  Discontinued     1,000 mg 200 mL/hr over 60 Minutes Intravenous Every 24 hours 08/12/12 2354 08/14/12 1057   08/13/12 0500  piperacillin-tazobactam (ZOSYN) IVPB 3.375 g     3.375 g 12.5 mL/hr over 240 Minutes Intravenous Every 8 hours 08/12/12 2354     08/12/12 2130  vancomycin (VANCOCIN) IVPB 1000 mg/200 mL premix     1,000 mg 200 mL/hr over 60 Minutes Intravenous  Once 08/12/12 1954 08/12/12 2209   08/12/12 2100  piperacillin-tazobactam (ZOSYN) IVPB 3.375 g     3.375 g 100 mL/hr over 30 Minutes Intravenous  Once 08/12/12 1954 08/12/12 2104      Assessment: 72 year old female presents with abdominal pain, nausea/vomiting - UTI vs gastroparesis. CXR shows RUL pneumonia, possible aspiration, started on broad spectrum antibiotics on 4/11. CT from 4/14 shows necrotizing pneumonia.   Day #5 Vanc and Zosyn  Afebrile, WBC remain elevated but improving  SCr continues to improve, CrCl ~61 ml/min. Last dosage adjustment was made on 4/13   Goal of Therapy:  Vancomycin trough level 15-20 mcg/ml Zosyn dose per renal function  Plan:   Continue Vancomycin 750mg  IV q12h  Check trough at steady state, 4/15 at 2300  Continue Zosyn 3.375gm IV q8h (4hr extended infusions) Follow up renal function & cultures, de-escalation of therapy  Beatrix Shipper PharmD Candidate 08/16/2012 2:16 PM  Lynann Beaver PharmD, BCPS Pager [phone] 08/16/2012 2:25 PM

## 2012-08-17 ENCOUNTER — Ambulatory Visit (HOSPITAL_COMMUNITY): Payer: Medicare Other

## 2012-08-17 DIAGNOSIS — J690 Pneumonitis due to inhalation of food and vomit: Secondary | ICD-10-CM

## 2012-08-17 LAB — LEGIONELLA ANTIGEN, URINE: Legionella Antigen, Urine: NEGATIVE

## 2012-08-17 LAB — BASIC METABOLIC PANEL
BUN: 6 mg/dL (ref 6–23)
CO2: 22 mEq/L (ref 19–32)
Calcium: 7.7 mg/dL — ABNORMAL LOW (ref 8.4–10.5)
Chloride: 107 mEq/L (ref 96–112)
Creatinine, Ser: 0.56 mg/dL (ref 0.50–1.10)
GFR calc Af Amer: >90 mL/min (ref >90)
GFR calc non Af Amer: >90 mL/min (ref >90)
Glucose, Bld: 115 mg/dL — ABNORMAL HIGH (ref 70–99)
Potassium: 3.8 mEq/L (ref 3.5–5.1)
Sodium: 137 mEq/L (ref 135–145)

## 2012-08-17 LAB — CBC
HCT: 27.2 % — ABNORMAL LOW (ref 36.0–46.0)
Hemoglobin: 9 g/dL — ABNORMAL LOW (ref 12.0–15.0)
MCH: 26 pg (ref 26.0–34.0)
MCHC: 33.1 g/dL (ref 30.0–36.0)
MCV: 78.6 fL (ref 78.0–100.0)
Platelets: 405 10*3/uL — ABNORMAL HIGH (ref 150–400)
RBC: 3.46 MIL/uL — ABNORMAL LOW (ref 3.87–5.11)
RDW: 15.5 % (ref 11.5–15.5)
WBC: 13.1 10*3/uL — ABNORMAL HIGH (ref 4.0–10.5)

## 2012-08-17 LAB — HEPATIC FUNCTION PANEL
ALT: 11 U/L (ref 0–35)
AST: 28 U/L (ref 0–37)
Albumin: 1.7 g/dL — ABNORMAL LOW (ref 3.5–5.2)
Alkaline Phosphatase: 121 U/L — ABNORMAL HIGH (ref 39–117)
Bilirubin, Direct: <0.1 mg/dL (ref 0.0–0.3)
Total Bilirubin: 0.2 mg/dL — ABNORMAL LOW (ref 0.3–1.2)
Total Protein: 6 g/dL (ref 6.0–8.3)

## 2012-08-17 LAB — VANCOMYCIN, TROUGH: Vancomycin Tr: 20.3 ug/mL — ABNORMAL HIGH (ref 10.0–20.0)

## 2012-08-17 LAB — GLUCOSE, CAPILLARY: Glucose-Capillary: 112 mg/dL — ABNORMAL HIGH (ref 70–99)

## 2012-08-17 MED ORDER — AMOXICILLIN 250 MG/5ML PO SUSR
500.0000 mg | Freq: Three times a day (TID) | ORAL | Status: DC
  Administered 2012-08-17 – 2012-08-19 (×6): 500 mg
  Filled 2012-08-17 (×9): qty 10

## 2012-08-17 MED ORDER — METRONIDAZOLE 500 MG PO TABS
500.0000 mg | ORAL_TABLET | Freq: Three times a day (TID) | ORAL | Status: DC
  Administered 2012-08-17 – 2012-08-19 (×6): 500 mg
  Filled 2012-08-17 (×9): qty 1

## 2012-08-17 MED ORDER — HYDROMORPHONE HCL PF 2 MG/ML IJ SOLN
2.0000 mg | INTRAMUSCULAR | Status: DC | PRN
  Administered 2012-08-17 – 2012-08-19 (×5): 2 mg via INTRAVENOUS
  Filled 2012-08-17 (×5): qty 1

## 2012-08-17 MED ORDER — MORPHINE SULFATE 10 MG/5ML PO SOLN: 5.0000 mg | ORAL | Status: DC | PRN

## 2012-08-17 MED ORDER — VANCOMYCIN HCL 10 G IV SOLR
1250.0000 mg | INTRAVENOUS | Status: DC
  Administered 2012-08-17: 1250 mg via INTRAVENOUS
  Filled 2012-08-17: qty 1250

## 2012-08-17 MED ORDER — METRONIDAZOLE 50 MG/ML ORAL SUSPENSION
500.0000 mg | Freq: Three times a day (TID) | ORAL | Status: DC
  Filled 2012-08-17 (×2): qty 10

## 2012-08-17 MED ORDER — MORPHINE SULFATE 10 MG/5ML PO SOLN: 10.0000 mg | ORAL | Status: DC | PRN

## 2012-08-17 NOTE — Progress Notes (Signed)
Patient ID: Emily [name], female   DOB: 1940-06-30, 72 y.o.   MRN: [id number]         [facility]    Date of Admission:  08/12/2012           Day 6 vancomycin        Day 6 piperacillin tazobactam Principal Problem:   CAP (community acquired pneumonia) Active Problems:   Leukocytosis   DM, UNCOMPLICATED, TYPE II   HYPOKALEMIA   ANEMIA, IRON DEFICIENCY   DISORDER, DEPRESSIVE NEC   HYPERTENSION   GERD   Gastroparesis   Nausea with vomiting   Abdominal pain, epigastric   Hyponatremia   Acute kidney failure   UTI (urinary tract infection)   Dilation of biliary tract   Elevated LFTs   . enoxaparin (LOVENOX) injection  40 mg Subcutaneous QHS  . free water  30 mL Per Tube Q6H  . hydrocortisone  25 mg Rectal BID  . metoCLOPramide (REGLAN) injection  10 mg Intravenous Q8H  . pantoprazole (PROTONIX) IV  40 mg Intravenous Q12H  . piperacillin-tazobactam (ZOSYN)  IV  3.375 g Intravenous Q8H  . sucralfate  1 g Per Tube TID WC & HS  . vancomycin  1,250 mg Intravenous Q24H    Subjective: She states that her upper abdominal pain is unchanged. She says that her cough has resolved. She denies shortness of breath.  Objective: Temp:  [98.5 F (36.9 C)-98.8 F (37.1 C)] 98.5 F (36.9 C) (04/16 1320) Pulse Rate:  [101-109] 103 (04/16 1320) Resp:  [18-20] 18 (04/16 1320) BP: (124-134)/(71-81) 130/72 mmHg (04/16 1320) SpO2:  [96 %-100 %] 97 % (04/16 1320) Weight:  [75.615 kg (166 lb 11.2 oz)] 75.615 kg (166 lb 11.2 oz) (04/16 0500)  General: She was sleeping initially but arouses easily Lungs: clear  Cor: Regular S1 and S2 no murmurs  Abdomen:  mild upper quadrant tenderness   she does not cough during exam but she frequently spits saliva out into her bedside basin  Lab Results Lab Results  Component Value Date   WBC 13.1* 08/17/2012   HGB 9.0* 08/17/2012   HCT 27.2* 08/17/2012   MCV 78.6 08/17/2012   PLT 405* 08/17/2012    Assessment: She probably  does have an area of aspiration pneumonia in her right lower lobe associated with her esophageal dysfunction. She has very mild symptoms and no cultures to help guide antibiotic therapy. Given that her CT scan 2 days ago still showed significant consolidation I will continue antibiotic therapy but will switch to amoxicillin plus metronidazole through her J-tube and plan on treatment for at 14 days total and then repeating chest x-ray.   Plan: 1. Change antibiotics to amoxicillin plus metronidazole through her J-tube  2. I can arrange followup in our clinic after discharge  Cliffton Asters, MD [facility] [phone] pager   [phone] cell 08/17/2012, 2:57 PM

## 2012-08-17 NOTE — Progress Notes (Signed)
ANTIBIOTIC CONSULT NOTE - FOLLOW UP  Pharmacy Consult for Vancomycin Indication: Pneumonia, UTI  No Known Allergies  Patient Measurements: Height: 5\' 4"  (162.6 cm) Weight: 157 lb 12.8 oz (71.578 kg) IBW/kg (Calculated) : 54.7  Vital Signs: Temp: 98.7 F (37.1 C) (04/15 2200) Temp src: Oral (04/15 2200) BP: 127/71 mmHg (04/15 2200) Pulse Rate: 109 (04/15 2200) Intake/Output from previous day: 04/15 0701 - 04/16 0700 In: 1923.8 [I.V.:1893.8] Out: 20 [Urine:20] Intake/Output from this shift: Total I/O In: 1893.8 [I.V.:1893.8] Out: -   Labs:  Recent Labs  08/14/12 0535 08/14/12 1855 08/15/12 0500 08/16/12 0540  WBC 12.9*  --  13.4* 12.8*  HGB 9.5*  --  9.4* 8.6*  PLT 532*  --  476* 425*  LABCREA  --  92.0  --   --   CREATININE 0.66  --  0.70 0.54   Estimated Creatinine Clearance: 61.7 ml/min (by C-G formula based on Cr of 0.54).    Microbiology: Recent Results (from the past 720 hour(s))  URINE CULTURE     Status: None   Collection Time    08/12/12 10:12 PM      Result Value Range Status   Specimen Description URINE, CLEAN CATCH   Final   Special Requests NONE   Final   Culture  Setup Time 08/13/2012 12:06   Final   Colony Count 85,000 COLONIES/ML   Final   Culture     Final   Value: Multiple bacterial morphotypes present, none predominant. Suggest appropriate recollection if clinically indicated.   Report Status 08/14/2012 FINAL   Final  URINE CULTURE     Status: None   Collection Time    08/13/12  5:45 PM      Result Value Range Status   Specimen Description URINE, CLEAN CATCH   Final   Special Requests NONE   Final   Culture  Setup Time 08/14/2012 02:05   Final   Colony Count 2,000 COLONIES/ML   Final   Culture INSIGNIFICANT GROWTH   Final   Report Status 08/15/2012 FINAL   Final    Anti-infectives   Start     Dose/Rate Route Frequency Ordered Stop   08/14/12 1200  vancomycin (VANCOCIN) IVPB 750 mg/150 ml premix     750 mg 150 mL/hr over 60  Minutes Intravenous Every 12 hours 08/14/12 1058     08/13/12 2100  vancomycin (VANCOCIN) IVPB 1000 mg/200 mL premix  Status:  Discontinued     1,000 mg 200 mL/hr over 60 Minutes Intravenous Every 24 hours 08/12/12 2354 08/14/12 1057   08/13/12 0500  piperacillin-tazobactam (ZOSYN) IVPB 3.375 g     3.375 g 12.5 mL/hr over 240 Minutes Intravenous Every 8 hours 08/12/12 2354     08/12/12 2130  vancomycin (VANCOCIN) IVPB 1000 mg/200 mL premix     1,000 mg 200 mL/hr over 60 Minutes Intravenous  Once 08/12/12 1954 08/12/12 2209   08/12/12 2100  piperacillin-tazobactam (ZOSYN) IVPB 3.375 g     3.375 g 100 mL/hr over 30 Minutes Intravenous  Once 08/12/12 1954 08/12/12 2104      Assessment: 72 year old female presents with abdominal pain, nausea/vomiting - UTI vs gastroparesis. CXR shows RUL pneumonia, possible aspiration, started on broad spectrum antibiotics on 4/11. CT from 4/14 shows necrotizing pneumonia.   Day #6 Vanc and Zosyn  Afebrile, WBC remain elevated but improving  SCr continues to improve, CrCl ~61 ml/min. Last dosage adjustment was made on 4/13  VT= 20.3 mcg/ml- slightly above desired goal  Goal of Therapy:  Vancomycin trough level 15-20 mcg/ml Zosyn dose per renal function  Plan:   Decrease Vancomycin to 1250mg  IV q24h. Follow up renal function & cultures, de-escalation of therapy  Lorenza Evangelist 08/17/2012 12:36 AM

## 2012-08-17 NOTE — Progress Notes (Signed)
Clinical Social Work  CSW met with patient at bedside. CSW explained bed offers and patient reports she will review offers and discuss decision with dtr. CSW left contact information and agreed to follow up tomorrow in order discuss decision.  [place], [place] [phone]
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
## 2012-08-17 NOTE — Progress Notes (Signed)
Patient seen, examined, and I agree with the above documentation, including the assessment and plan.  

## 2012-08-18 DIAGNOSIS — F329 Major depressive disorder, single episode, unspecified: Secondary | ICD-10-CM

## 2012-08-18 DIAGNOSIS — E119 Type 2 diabetes mellitus without complications: Secondary | ICD-10-CM

## 2012-08-18 DIAGNOSIS — I10 Essential (primary) hypertension: Secondary | ICD-10-CM

## 2012-08-18 DIAGNOSIS — J189 Pneumonia, unspecified organism: Secondary | ICD-10-CM

## 2012-08-18 DIAGNOSIS — F3289 Other specified depressive episodes: Secondary | ICD-10-CM

## 2012-08-18 LAB — GLUCOSE, CAPILLARY: Glucose-Capillary: 132 mg/dL — ABNORMAL HIGH (ref 70–99)

## 2012-08-18 NOTE — Progress Notes (Signed)
Clinical Social Work  Patient provided dtr with CSW contact information and dtr called CSW to discuss bed offers. Dtr had questions regarding Medicare/Medicaid benefits. CSW explained benefits. Dtr reports she and patient are still discussing offers and will contact CSW after they have made a decision.  [place], [place] [phone]

## 2012-08-18 NOTE — Progress Notes (Signed)
Clinical Social Work  CSW met with patient at bedside. Patient sitting in chair and agreeable to talk with CSW. CSW inquired about SNF offers and if patient and dtr had made any decisions. Patient reports that she and dtr are still deciding. CSW explained when MD medically dc patient from hospital that she would go to SNF on the same day so it would be important to have a facility chosen. Patient voiced understanding. CSW offered to call dtr but patient declined and reports she will discuss case with dtr. CSW will continue to follow.  [place], [place] [phone]

## 2012-08-18 NOTE — Progress Notes (Signed)
[facility] PROGRESS NOTE  Starlina Lapre ZOX:[id number] DOB: 10-29-1940 DOA: 08/12/2012 PCP: Dorrene German, MD  Assessment/Plan: #1 abdominal pain, nausea or vomiting Questionable etiology. Likely secondary to abnormal MRCP showing common bile duct tapers abruptly at the ampulla.  Patient still nauseous with suprapubic abdominal pain. Lipase levels within normal limits. Urine cultures are negative. Continue empiric IV antibiotics. HIDA scan with biliary patency. Continue supportive care. Per GI May likely need to followup at [facility] for EUS, retrograde ERCP and further management.  - appreciate input and recommendations.  -Continue supportive care. Continue IV fluids.   #2 bacteriuria  Urine cultures negative.  #3 CAP vs aspiration pneumonia Secondary to problem #1. Chest x-ray with right upper lobe pneumonia and loculated fluid in right lower lobe. Patient is currently afebrile. Leukocytosis is slowly trending down. CT of the chest consistent with a necrotizing pneumonia>>Patient seen by ID and impression that she has CAP and aspiration PNA,  -continue  IV vancomycin and Zosyn,  ID to follow for further recommendations  #4 hypokalemia Repleted.   #5 dehydration IV fluids.  #6 gastroparesis Continue IV Reglan.  #7 type 2 diabetes Hemoglobin A1c is pending. CBGs 98-112. Continue sliding scale insulin. Follow.  #8 acute renal failure Secondary to prerenal azotemia. Improved with IV fluids.  #9 hyponatremia Secondary to hypovolemic hyponatremia. Decrease IV fluids.  #10 gastroesophageal reflux disease PPI.  #11 depression/anxiety Ativan as needed.  #12 leukocytosis Likely secondary to community acquired pneumonia/aspiration PNA and urinary tract infection. WBC is fluctuating. Urine cultures are negative.. Blood cultures are pending. MRCP with , and bile duct measuring 12 mm and tapering abruptly at the ampulla. Also patchy right middle lobe opacity suspicious for  pneumonia noted with concern for necrosis. CT of the chest done shows necrotizing pneumonia. HIDA with biliary patency. Continue IV vancomycin and Zosyn. ID ff and appreciate input and rxcs. Follow.  #13 anemia H&H stable. Follow.  #14 prophylaxis PPI for GI prophylaxis. Lovenox for DVT prophylaxis.  Code Status: Full Family Communication: Updated patient, no family present. Disposition Plan: Home when medically stable vs SNF   Consultants:  GI: Dr Elnoria Howard 08/13/12  Procedures:  Acute abdominal series 08/12/12  MRCP 08/15/12  hida SCAN PENDING  CT of the chest 08/15/2012  Antibiotics:  IV Vancomycin 08/13/12  IV Zosyn 08/13/12  HPI/Subjective: Reports not feeling well today- states lower  abdominal pain about the same, +nausea, denies vomiting.  Objective: Filed Vitals:   08/17/12 1320 08/17/12 2156 08/18/12 0542 08/18/12 1413  BP: 130/72 124/76 146/82 139/78  Pulse: 103 101 119 105  Temp: 98.5 F (36.9 C) 98.3 F (36.8 C) 98.9 F (37.2 C) 98.7 F (37.1 C)  TempSrc: Oral Oral Oral Oral  Resp: 18 18 18 20   Height:      Weight:      SpO2: 97% 97% 96% 94%    Intake/Output Summary (Last 24 hours) at 08/18/12 1631 Last data filed at 08/17/12 1903  Gross per 24 hour  Intake  947.5 ml  Output      0 ml  Net  947.5 ml   Filed Weights   08/14/12 0600 08/15/12 0500 08/17/12 0500  Weight: 68.04 kg (150 lb) 71.578 kg (157 lb 12.8 oz) 75.615 kg (166 lb 11.2 oz)    Exam:   General:  NAD  Cardiovascular: RRR  Respiratory: CTAB  Abdomen: Soft/ND mild  lower abdominal tenderness no rebound +BS.  Extremities: No c/c/e  Data Reviewed: Basic Metabolic Panel:  Recent Labs Lab 08/13/12  0015 08/14/12 0535 08/15/12 0500 08/16/12 0540 08/17/12 0515  NA 130* 128* 133* 139 137  K 3.5 3.6 3.5 3.4* 3.8  CL 91* 97 102 108 107  CO2 21 21 21 24 22   GLUCOSE 106* 136* 117* 116* 115*  BUN 40* 20 14 7 6   CREATININE 1.10 0.66 0.70 0.54 0.56  CALCIUM 8.8 8.0* 7.6*  7.5* 7.7*  MG 2.6* 1.9  --   --   --   PHOS 2.8  --   --   --   --    Liver Function Tests:  Recent Labs Lab 08/12/12 1806 08/13/12 0015 08/17/12 0515  AST 30 35 28  ALT 23 26 11   ALKPHOS 208* 228* 121*  BILITOT 0.6 0.7 0.2*  PROT 7.3 8.3 6.0  ALBUMIN 2.2* 2.4* 1.7*    Recent Labs Lab 08/12/12 1806  LIPASE 53   No results found for this basename: AMMONIA,  in the last 168 hours CBC:  Recent Labs Lab 08/12/12 1806 08/13/12 0015 08/14/12 0535 08/15/12 0500 08/16/12 0540 08/17/12 0515  WBC 19.2* 17.9* 12.9* 13.4* 12.8* 13.1*  NEUTROABS 16.8* 14.5*  --   --   --   --   HGB 11.3* 11.9* 9.5* 9.4* 8.6* 9.0*  HCT 34.0* 35.0* 28.7* 29.1* 26.4* 27.2*  MCV 77.4* 76.6* 78.0 78.0 78.6 78.6  PLT 485* 585* 532* 476* 425* 405*   Cardiac Enzymes: No results found for this basename: CKTOTAL, CKMB, CKMBINDEX, TROPONINI,  in the last 168 hours BNP (last 3 results) No results found for this basename: PROBNP,  in the last 8760 hours CBG:  Recent Labs Lab 08/13/12 0732 08/14/12 0735 08/15/12 0802 08/16/12 0735 08/17/12 0726  GLUCAP 164* 156* 98 102* 112*    Recent Results (from the past 240 hour(s))  URINE CULTURE     Status: None   Collection Time    08/12/12 10:12 PM      Result Value Range Status   Specimen Description URINE, CLEAN CATCH   Final   Special Requests NONE   Final   Culture  Setup Time 08/13/2012 12:06   Final   Colony Count 85,000 COLONIES/ML   Final   Culture     Final   Value: Multiple bacterial morphotypes present, none predominant. Suggest appropriate recollection if clinically indicated.   Report Status 08/14/2012 FINAL   Final  URINE CULTURE     Status: None   Collection Time    08/13/12  5:45 PM      Result Value Range Status   Specimen Description URINE, CLEAN CATCH   Final   Special Requests NONE   Final   Culture  Setup Time 08/14/2012 02:05   Final   Colony Count 2,000 COLONIES/ML   Final   Culture INSIGNIFICANT GROWTH   Final    Report Status 08/15/2012 FINAL   Final     Studies: No results found.  Scheduled Meds: . amoxicillin  500 mg Per Tube Q8H  . enoxaparin (LOVENOX) injection  40 mg Subcutaneous QHS  . free water  30 mL Per Tube Q6H  . hydrocortisone  25 mg Rectal BID  . metoCLOPramide (REGLAN) injection  10 mg Intravenous Q8H  . metroNIDAZOLE  500 mg Per Tube Q8H  . pantoprazole (PROTONIX) IV  40 mg Intravenous Q12H  . sucralfate  1 g Per Tube TID WC & HS   Continuous Infusions: . sodium chloride 75 mL/hr (08/18/12 1057)  . feeding supplement (OSMOLITE 1.2 CAL) 1,000 mL (08/16/12 1615)  Principal Problem:   CAP (community acquired pneumonia) Active Problems:   DM, UNCOMPLICATED, TYPE II   HYPOKALEMIA   ANEMIA, IRON DEFICIENCY   DISORDER, DEPRESSIVE NEC   HYPERTENSION   GERD   Gastroparesis   Nausea with vomiting   Abdominal pain, epigastric   Leukocytosis   Hyponatremia   Acute kidney failure   UTI (urinary tract infection)   Dilation of biliary tract   Elevated LFTs    Time spent: 25 mins    Vanetta Rule C  [facility] Pager [phone]. If 7PM-7AM, please contact night-coverage at [email or web address], password [facility] 08/18/2012, 4:31 PM  LOS: 6 days

## 2012-08-18 NOTE — Progress Notes (Signed)
Patient ID: Emily [name], female   DOB: [birth year], 72 y.o.   MRN: [id number]         [facility]    Date of Admission:  08/12/2012   Total days of antibiotics 7        Day 2 amoxicillin        Day 2 metronidazole         Principal Problem:   CAP (community acquired pneumonia) Active Problems:   Leukocytosis   DM, UNCOMPLICATED, TYPE II   HYPOKALEMIA   ANEMIA, IRON DEFICIENCY   DISORDER, DEPRESSIVE NEC   HYPERTENSION   GERD   Gastroparesis   Nausea with vomiting   Abdominal pain, epigastric   Hyponatremia   Acute kidney failure   UTI (urinary tract infection)   Dilation of biliary tract   Elevated LFTs   . amoxicillin  500 mg Per Tube Q8H  . enoxaparin (LOVENOX) injection  40 mg Subcutaneous QHS  . free water  30 mL Per Tube Q6H  . hydrocortisone  25 mg Rectal BID  . metoCLOPramide (REGLAN) injection  10 mg Intravenous Q8H  . metroNIDAZOLE  500 mg Per Tube Q8H  . pantoprazole (PROTONIX) IV  40 mg Intravenous Q12H  . sucralfate  1 g Per Tube TID WC & HS    Subjective: "I am sick, stomach today". She denies any cough or shortness of breath.  Objective: Temp:  [98.3 F (36.8 C)-98.9 F (37.2 C)] 98.7 F (37.1 C) (04/17 1413) Pulse Rate:  [101-119] 105 (04/17 1413) Resp:  [18-20] 20 (04/17 1413) BP: (124-146)/(76-82) 139/78 mmHg (04/17 1413) SpO2:  [94 %-97 %] 94 % (04/17 1413)  General: In no distress sitting up in the chair Lungs: Clear Cor: Regular S1 and S2 with no murmurs Abdomen: Soft and not obviously tender when distracted She is spitting out frothy white oral secretions frequently  Lab Results Lab Results  Component Value Date   WBC 13.1* 08/17/2012   HGB 9.0* 08/17/2012   HCT 27.2* 08/17/2012   MCV 78.6 08/17/2012   PLT 405* 08/17/2012    Assessment: She is very little clinical evidence of pneumonia but I would continue amoxicillin and metronidazole down her tube for one more week then repeat her chest x-ray to  reevaluate the right lower lobe infiltrate.  Plan: 1. Continue current antibiotics  Cliffton Asters, MD [facility] Infectious Disease [facility] [phone] pager   [phone] cell 08/18/2012, 4:17 PM

## 2012-08-18 NOTE — Progress Notes (Signed)
NUTRITION FOLLOW UP  Intervention:   - Recommend MD address pt's 21 pound weight gain since admission - Recommend advancement back to Osmolite 1.2 at 90ml/hr as it is not likely TF coming out in pt's emesis as pt with J tube - Diet advancement per MD - Will continue to monitor   Nutrition Dx:   Inadequate oral intake related to abdominal pain, nausea, vomting as evidenced by pt statement - ongoing   Goal:   1. Resolution of abdominal pain, nausea, vomiting - unmet  2. TF to meet >90% of estimated nutritional needs with TF tolerance - not met    Monitor:   Weights, labs, intake, TF tolerance/advancement, nausea, vomiting, abdominal pain   Assessment:   Pt reports tolerating TF. Noted Osmolite 1.2 running at 43ml/hr, not at goal rate of 60ml/hr. Pt c/o nausea, vomiting, back pain, and abdominal pain - notified RN. Emesis has been spit up. RN reports TF rate not at goal due to pt's c/o abdominal pain and emesis looking like TF despite pt having J tube. Current rate of Osmolite 1.2 at 56ml/hr provides 1152 calories, 53g protein meeting 82% estimated calorie needs, 76% protein needs. Pt's weight up 21 pounds since admission. Pt with non-pitting RUE, LUE, RLE, and LLE edema.   Height: Ht Readings from Last 1 Encounters:  08/12/12 5\' 4"  (1.626 m)    Weight Status:   Wt Readings from Last 1 Encounters:  08/17/12 166 lb 11.2 oz (75.615 kg)    Re-estimated needs:  Kcal: 1400-1675  Protein: 70-80g  Fluid: 1.4-1.6L/day   Skin: Stage 1 sacral pressure ulcer  Diet Order: Clear Liquid   Intake/Output Summary (Last 24 hours) at 08/18/12 1458 Last data filed at 08/17/12 1903  Gross per 24 hour  Intake  947.5 ml  Output      0 ml  Net  947.5 ml    Last BM: 4/17   Labs:   Recent Labs Lab 08/13/12 0015 08/14/12 0535 08/15/12 0500 08/16/12 0540 08/17/12 0515  NA 130* 128* 133* 139 137  K 3.5 3.6 3.5 3.4* 3.8  CL 91* 97 102 108 107  CO2 21 21 21 24 22   BUN 40* 20 14 7 6    CREATININE 1.10 0.66 0.70 0.54 0.56  CALCIUM 8.8 8.0* 7.6* 7.5* 7.7*  MG 2.6* 1.9  --   --   --   PHOS 2.8  --   --   --   --   GLUCOSE 106* 136* 117* 116* 115*    CBG (last 3)   Recent Labs  08/16/12 0735 08/17/12 0726  GLUCAP 102* 112*    Scheduled Meds: . amoxicillin  500 mg Per Tube Q8H  . enoxaparin (LOVENOX) injection  40 mg Subcutaneous QHS  . free water  30 mL Per Tube Q6H  . hydrocortisone  25 mg Rectal BID  . metoCLOPramide (REGLAN) injection  10 mg Intravenous Q8H  . metroNIDAZOLE  500 mg Per Tube Q8H  . pantoprazole (PROTONIX) IV  40 mg Intravenous Q12H  . sucralfate  1 g Per Tube TID WC & HS    Continuous Infusions: . sodium chloride 75 mL/hr (08/18/12 1057)  . feeding supplement (OSMOLITE 1.2 CAL) 1,000 mL (08/16/12 1615)     Levon Hedger MS, RD, LDN [phone] Pager [phone] After Hours Pager

## 2012-08-19 DIAGNOSIS — E876 Hypokalemia: Secondary | ICD-10-CM

## 2012-08-19 LAB — COMPREHENSIVE METABOLIC PANEL
ALT: 13 U/L (ref 0–35)
AST: 15 U/L (ref 0–37)
Albumin: 1.6 g/dL — ABNORMAL LOW (ref 3.5–5.2)
Alkaline Phosphatase: 94 U/L (ref 39–117)
BUN: 5 mg/dL — ABNORMAL LOW (ref 6–23)
CO2: 24 mEq/L (ref 19–32)
Calcium: 8.1 mg/dL — ABNORMAL LOW (ref 8.4–10.5)
Chloride: 113 mEq/L — ABNORMAL HIGH (ref 96–112)
Creatinine, Ser: 0.63 mg/dL (ref 0.50–1.10)
GFR calc Af Amer: >90 mL/min (ref >90)
GFR calc non Af Amer: 87 mL/min — ABNORMAL LOW (ref >90)
Glucose, Bld: 98 mg/dL (ref 70–99)
Potassium: 3.2 mEq/L — ABNORMAL LOW (ref 3.5–5.1)
Sodium: 144 mEq/L (ref 135–145)
Total Bilirubin: 0.2 mg/dL — ABNORMAL LOW (ref 0.3–1.2)
Total Protein: 5.1 g/dL — ABNORMAL LOW (ref 6.0–8.3)

## 2012-08-19 LAB — CBC
HCT: 25.3 % — ABNORMAL LOW (ref 36.0–46.0)
Hemoglobin: 8.3 g/dL — ABNORMAL LOW (ref 12.0–15.0)
MCH: 25.9 pg — ABNORMAL LOW (ref 26.0–34.0)
MCHC: 32.8 g/dL (ref 30.0–36.0)
MCV: 79.1 fL (ref 78.0–100.0)
Platelets: 392 10*3/uL (ref 150–400)
RBC: 3.2 MIL/uL — ABNORMAL LOW (ref 3.87–5.11)
RDW: 15.6 % — ABNORMAL HIGH (ref 11.5–15.5)
WBC: 10.7 10*3/uL — ABNORMAL HIGH (ref 4.0–10.5)

## 2012-08-19 LAB — GLUCOSE, CAPILLARY: Glucose-Capillary: 111 mg/dL — ABNORMAL HIGH (ref 70–99)

## 2012-08-19 MED ORDER — FREE WATER: 30.0000 mL | Freq: Four times a day (QID) | Status: DC

## 2012-08-19 MED ORDER — POTASSIUM CHLORIDE 20 MEQ/15ML (10%) PO LIQD
40.0000 meq | Freq: Once | ORAL | Status: AC
  Administered 2012-08-19: 40 meq
  Filled 2012-08-19: qty 30

## 2012-08-19 MED ORDER — AMOXICILLIN 250 MG/5ML PO SUSR: 500.0000 mg | Freq: Three times a day (TID) | ORAL | Status: DC

## 2012-08-19 MED ORDER — OSMOLITE 1.2 CAL PO LIQD: 1000.0000 mL | ORAL | Status: DC

## 2012-08-19 MED ORDER — MORPHINE SULFATE 10 MG/5ML PO SOLN: 5.0000 mg | ORAL | Status: DC | PRN

## 2012-08-19 MED ORDER — POTASSIUM CHLORIDE 20 MEQ/15ML (10%) PO LIQD
60.0000 meq | ORAL | Status: AC
  Administered 2012-08-19: 60 meq via ORAL
  Filled 2012-08-19: qty 45

## 2012-08-19 MED ORDER — METRONIDAZOLE 500 MG PO TABS: 500.0000 mg | ORAL_TABLET | Freq: Three times a day (TID) | ORAL | Status: DC

## 2012-08-19 MED ORDER — SUCRALFATE 1 GM/10ML PO SUSP: 1.0000 g | Freq: Two times a day (BID) | ORAL | Status: DC

## 2012-08-19 MED ORDER — METOCLOPRAMIDE HCL 5 MG PO TABS: 5.0000 mg | ORAL_TABLET | Freq: Three times a day (TID) | ORAL | Status: DC
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
## 2012-08-19 NOTE — Progress Notes (Signed)
Patient discharged to home. Reviewed discharge instructions with patient and her daughter.  IV in right inner forearm removed.  No further questions at this time regarding discharge.  Case management on consult for visiting RN.  Patient escorted to lobby via wheelchair.  Patient discharged.

## 2012-08-19 NOTE — Progress Notes (Signed)
Clinical Social Work  Patient and dtr are not satisfied with bed offers for SNF. Dtr reports that she will assist patient at home and will resume HH. Family prefers [facility]. CSW made CM are of HH needs. CSW text paged MD patient's decision. CSW is signing off but available if further needs arise.  [place], [place] [phone]

## 2012-08-19 NOTE — Progress Notes (Signed)
Clinical Social Work  During [employer], MD reports that patient is ready to dc today. CSW met with patient at bedside to discuss decision between SNF offers. Patient requests that CSW contact dtr who will make decision. CSW called dtr and left a message with CSW contact information. CSW will continue to follow.  [place], [place] [phone]

## 2012-08-19 NOTE — Progress Notes (Signed)
Pt wants to go home at d/c. Spoke with daughter, she wants to use [facility] for [facility] services, referral made.  Algernon Huxley RN,BSN  [phone]

## 2012-08-24 ENCOUNTER — Telehealth: Payer: Self-pay | Admitting: Internal Medicine

## 2012-08-24 NOTE — Telephone Encounter (Signed)
Spoke with patient and gave her Dr. Regino Schultze recommendations. She states she is not taking the Miralax but will stop the Amoxicillin. She will decrease TF to 1/2 amount x 5 days.

## 2012-08-24 NOTE — Telephone Encounter (Signed)
Please a stop Amoxacillin  and Glycolax if she is still on it. Decrease TF's to 1/2 amount x 5 days ( she uses 1000cc/24 hours vis feeding jejunostomy).

## 2012-08-24 NOTE — Telephone Encounter (Signed)
Patient states since her discharge from hospital last weekend, she has been having diarrhea mostly during the night. States she wakes up and has been incontinent of diarrhea stool. She is taking Imodium 2 tabs and it has not stopped it. She reports she may have one or two diarrhea stools during the day also. Please, advise.

## 2012-08-25 ENCOUNTER — Telehealth: Payer: Self-pay | Admitting: Internal Medicine

## 2012-08-25 ENCOUNTER — Other Ambulatory Visit: Payer: Self-pay | Admitting: *Deleted

## 2012-08-25 MED ORDER — LORAZEPAM 0.5 MG PO TABS: ORAL_TABLET | ORAL | Status: DC

## 2012-08-25 NOTE — Telephone Encounter (Signed)
Patient calling to report she only took one can of tube feeding. She reports she still was incontinent of stool at 4 AM. Patient did not stop the Amoxicillin. Told her this was recommended also. She will stop it today and see if this helps.

## 2012-08-29 IMAGING — CR DG FINGER LITTLE 2+V*L*
3 series · 3 of 3 positions shown · non-contrast
Comparison: None.

CLINICAL DATA: Recent fall with pain

LEFT LITTLE FINGER 2+V

[x finger pa left]
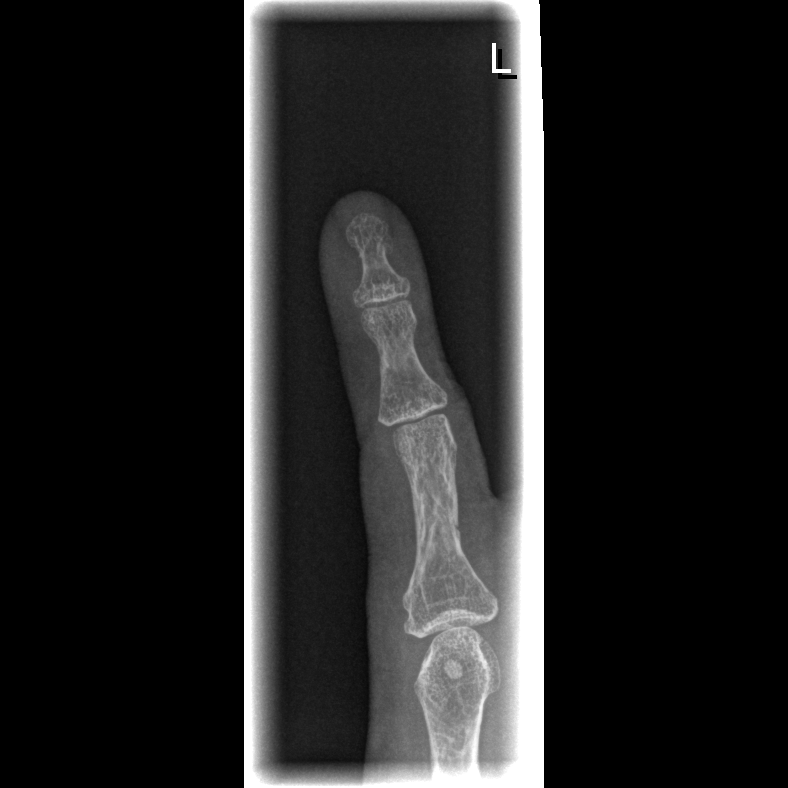

[x finger obl. left]
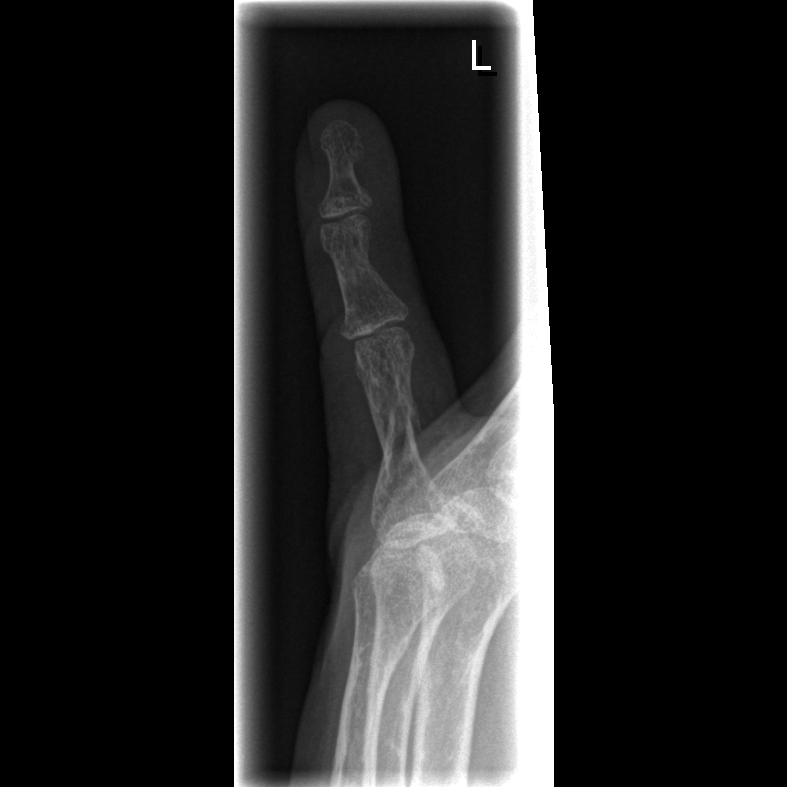

[x finger lateral left]
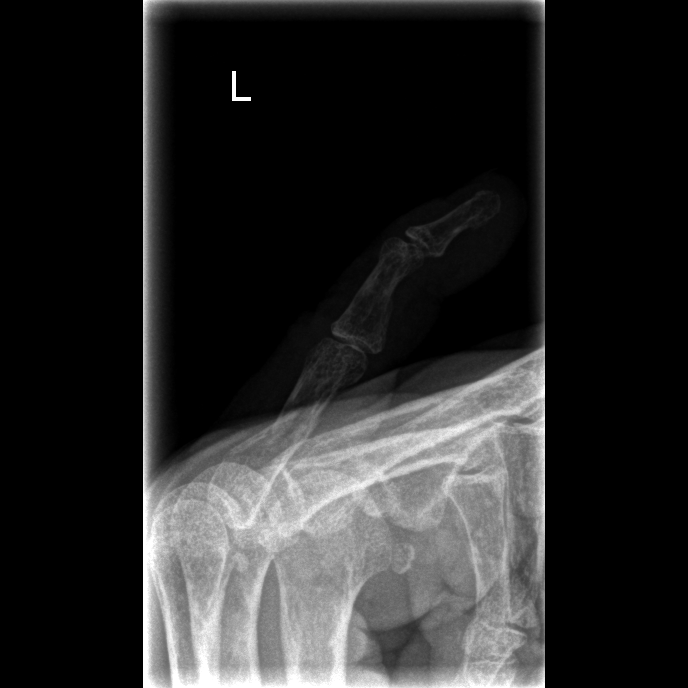

[3 of 3 positions shown; findings below may reference images not displayed]

FINDINGS: There is a slightly angulated fracture of the midportion
of the proximal phalanx of the left fifth digit.  The bones are
diffusely osteopenic.
IMPRESSION: Slightly angulated fracture of the midportion of the proximal
phalanx of the left fifth digit.

## 2012-08-29 IMAGING — CT CT ABD-PELV W/ CM
2 of 5 series · 14 of 32 positions shown, 19 images · IV contrast (omnipaque)
Comparison: Abdominal pelvic CT 08/25/2010.

CLINICAL DATA: Abdominal pain since falling 2 days ago.  History of
feeding tube.

CT ABDOMEN AND PELVIS WITH CONTRAST
TECHNIQUE: Multidetector CT imaging of the abdomen and pelvis was
performed following the standard protocol during bolus
administration of intravenous contrast.
Contrast: 80mL OMNIPAQUE IOHEXOL 300 MG/ML IV SOLN

[Series 2: routine abdomen · axial · 0.70mm/px · z∈[-421,-91]mm · 7 of 87 slices shown, 12 images]
[im 11/87  soft-tissue]
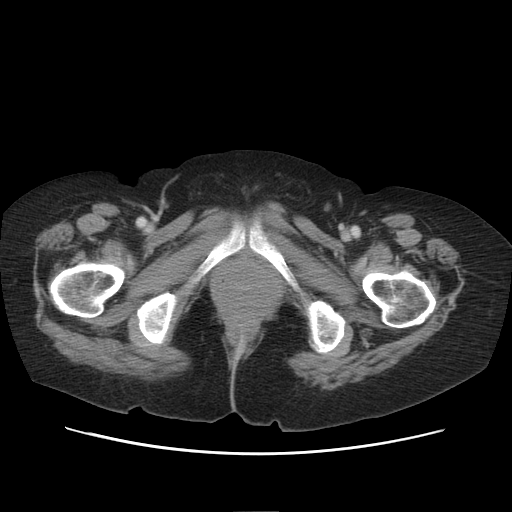
[im 11/87  bone]
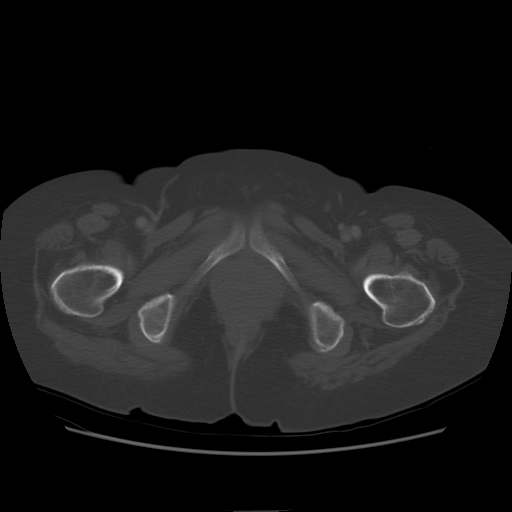
[im 22/87  soft-tissue]
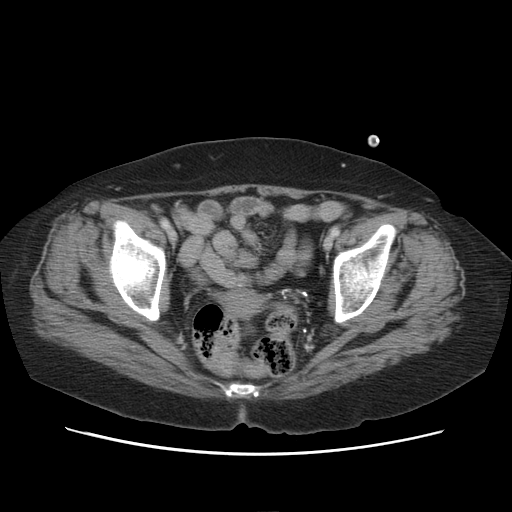
[im 33/87  soft-tissue]
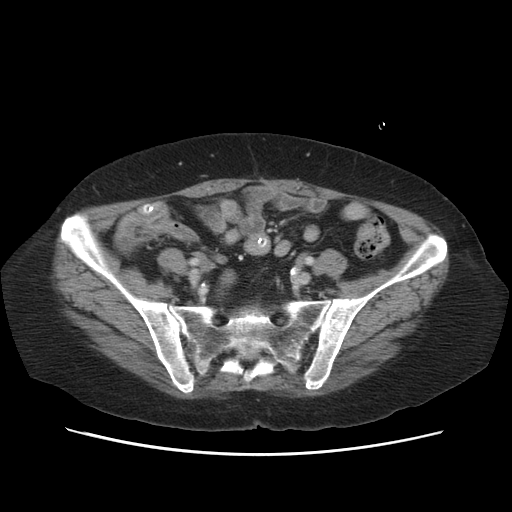
[im 44/87  soft-tissue]
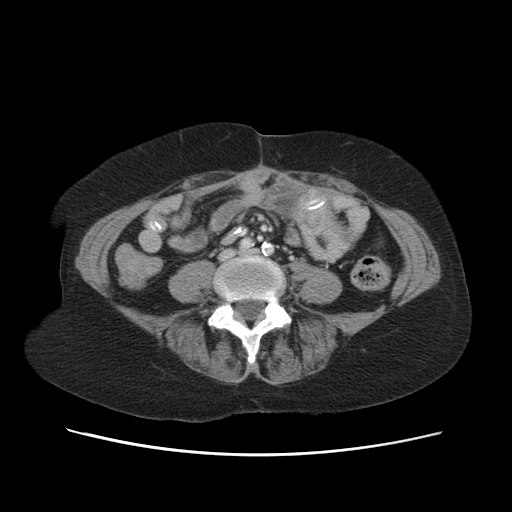
[im 44/87  lung]
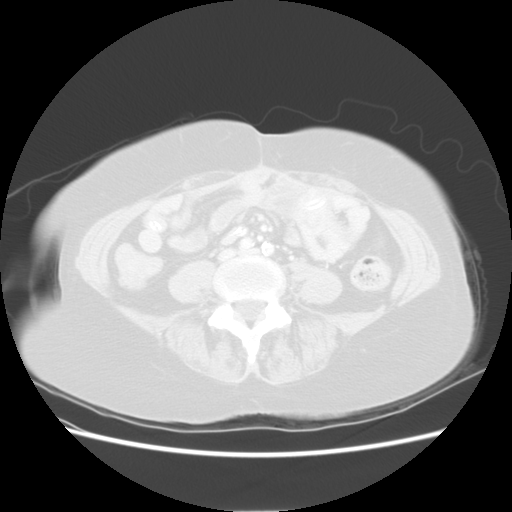
[im 54/87  soft-tissue]
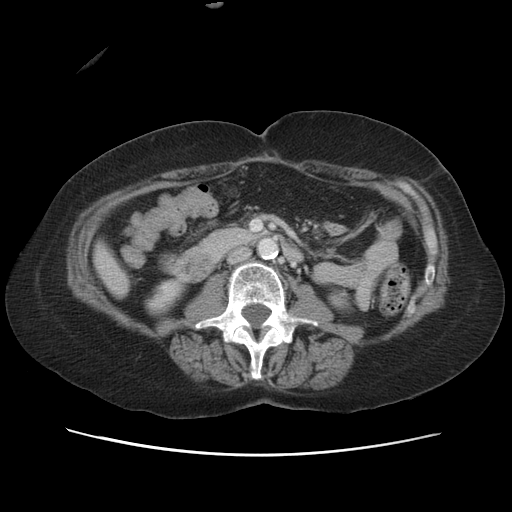
[im 54/87  lung]
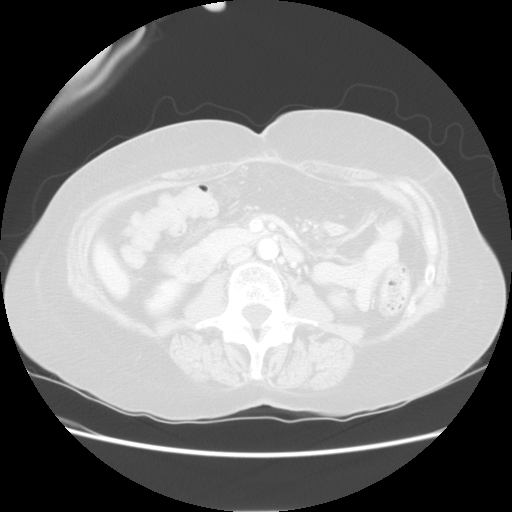
[im 65/87  soft-tissue]
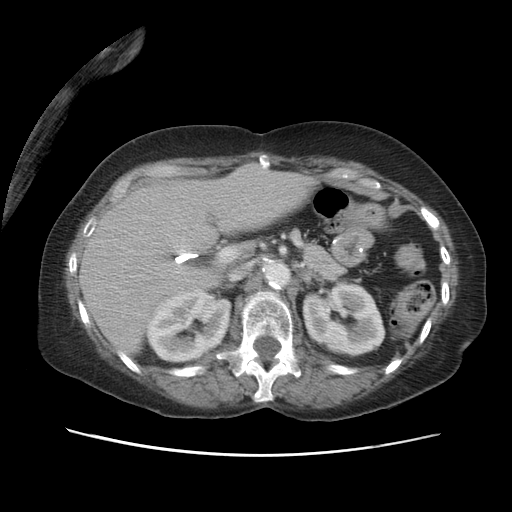
[im 65/87  lung]
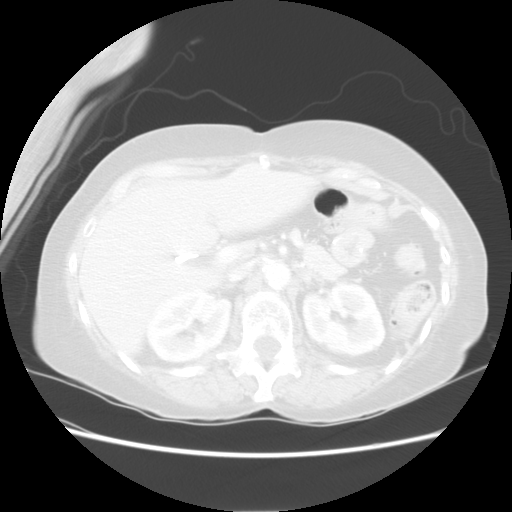
[im 76/87  soft-tissue]
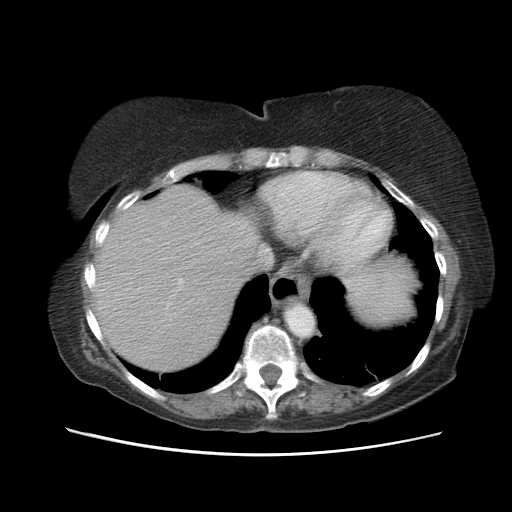
[im 76/87  lung]
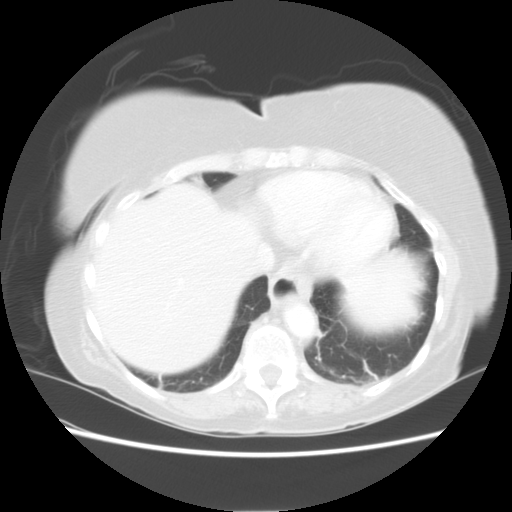

[Series 400: sag · sagittal · 0.87mm/px · 7 of 99 slices shown]
[im 11/99  soft-tissue]
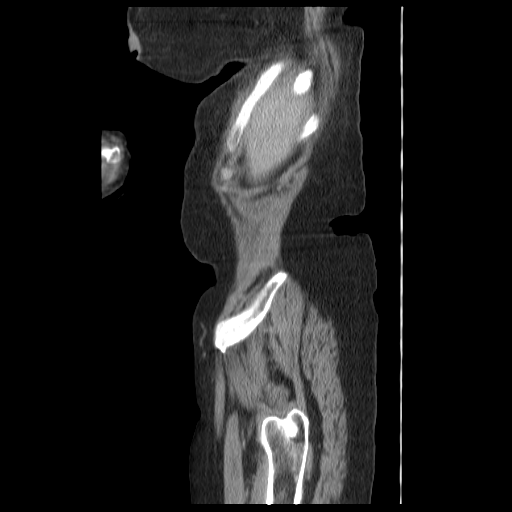
[im 22/99  soft-tissue]
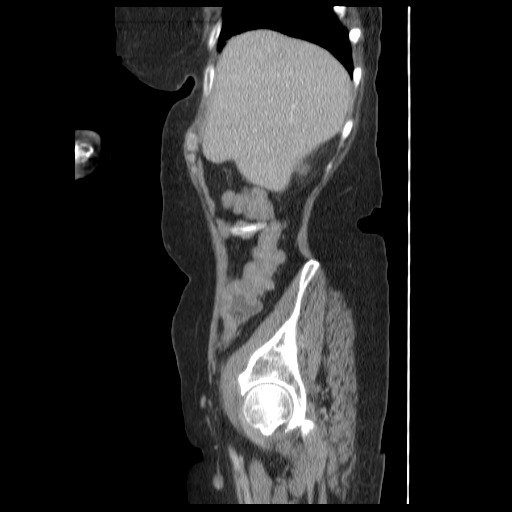
[im 33/99  soft-tissue]
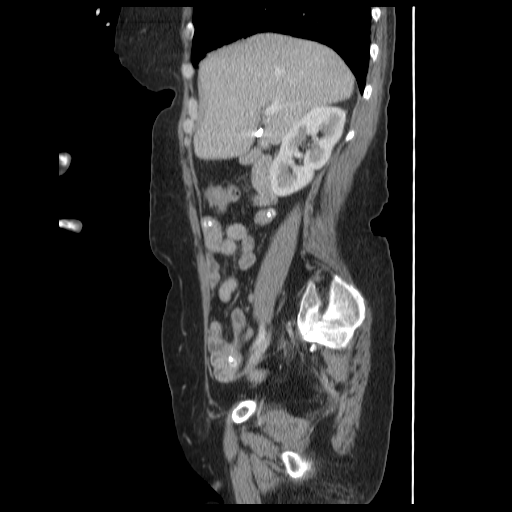
[im 44/99  soft-tissue]
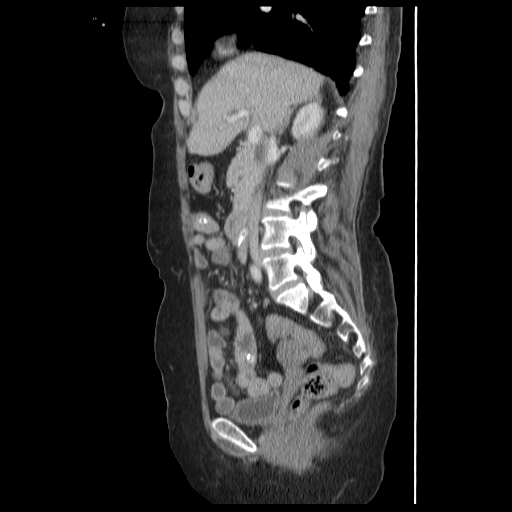
[im 55/99  soft-tissue]
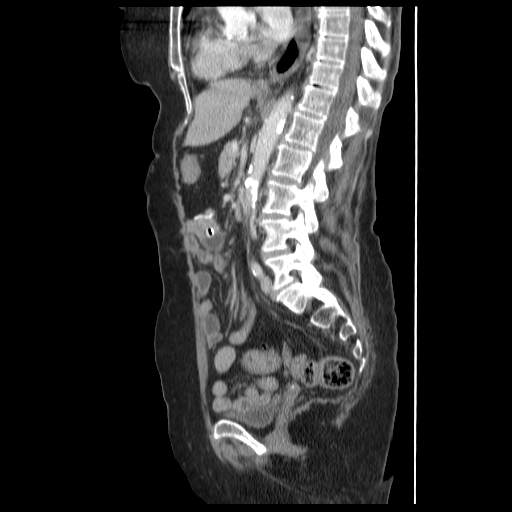
[im 66/99  soft-tissue]
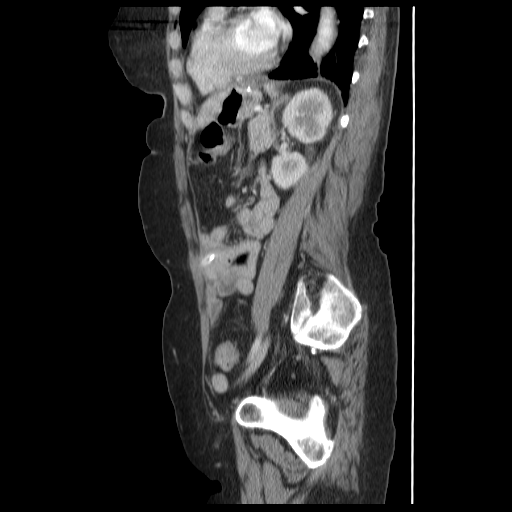
[im 77/99  soft-tissue]
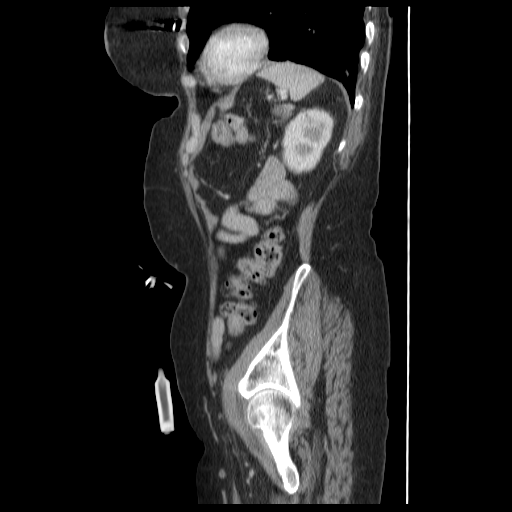

[14 of 32 positions shown; findings below may reference images not displayed]

FINDINGS: Previously demonstrated airspace disease at the left lung
base has resolved.  There is mild linear scarring at both lung
bases.  There is no pleural effusion.  A moderate sized hiatal
hernia is present.

There is stable biliary dilatation status post cholecystectomy.
The common bile duct measures up to 12 mm in diameter.  There is no
evidence of pancreatic mass or significant pancreatic ductal
dilatation.  The liver, spleen and adrenal glands appear normal.
Several small right-sided renal cysts are unchanged.  There is no
hydronephrosis.

Patient is status post partial gastrectomy.  The gastric remnant
appears smaller than on the prior study, suggesting possible
interval surgery.  Percutaneous jejunostomy tube is in place.
There is no evidence of bowel obstruction or surrounding
inflammatory change.  There is no extraluminal fluid collection.
Elongated appendix appears stable without surrounding inflammation.

The uterus, ovaries and urinary bladder appear unremarkable.  No
acute or significant osseous findings identified.
IMPRESSION: 1.  Interval resolution of left basilar pneumonia.
2.  Stable chronic biliary dilatation status post cholecystectomy.
3.  Resolved mild gastric and small bowel distension status post
partial gastrectomy.  No acute abdominal findings identified.

## 2012-08-31 ENCOUNTER — Telehealth: Payer: Self-pay | Admitting: Internal Medicine

## 2012-08-31 MED ORDER — DIPHENOXYLATE-ATROPINE 2.5-0.025 MG PO TABS: ORAL_TABLET | ORAL | Status: DC

## 2012-08-31 NOTE — Telephone Encounter (Signed)
Spoke with patient and she states she is still having diarrhea, rectal pain and small amount of rectal bleeding. The diarrhea is not as bad as it was before she stopped the antibiotics but is still occurring 2-3/day. States when she had a bowel movement "my tail hurts so bad."  Reports a small amount of bright, red blood from rectum. She has increased her nightly feeding to 2 cans again. She is not taking anything for the diarrhea. Please, advise.

## 2012-08-31 NOTE — Telephone Encounter (Signed)
Unable to reach patient. Will try again later. 

## 2012-08-31 NOTE — Telephone Encounter (Signed)
Unable to reach patient will try again later 

## 2012-08-31 NOTE — Telephone Encounter (Signed)
Use Lomotil 1 po bid x 1 week, then prn., #30

## 2012-09-01 NOTE — Telephone Encounter (Signed)
Spoke with patient and gave her Dr. Brodie's recommendation. Rx sent. 

## 2012-09-06 ENCOUNTER — Encounter: Payer: Self-pay | Admitting: *Deleted

## 2012-09-06 ENCOUNTER — Telehealth: Payer: Self-pay | Admitting: Internal Medicine

## 2012-09-06 NOTE — Telephone Encounter (Signed)
I have spoken with Verlon Au at [facility] [phone]). Lomotil has been approved until 09/06/13. Approval # [id number]. Pharmacy and patient advised of approval.

## 2012-09-06 NOTE — Telephone Encounter (Signed)
Per [facility], rx was received but is not covered under patient's insurance. I requested a prior auth request be sent to our fax. He stats he will do this.

## 2012-09-09 ENCOUNTER — Institutional Professional Consult (permissible substitution): Payer: Medicare Other | Admitting: Cardiology

## 2012-09-20 ENCOUNTER — Non-Acute Institutional Stay (SKILLED_NURSING_FACILITY): Payer: Medicare Other | Admitting: Internal Medicine

## 2012-09-20 DIAGNOSIS — G8929 Other chronic pain: Secondary | ICD-10-CM

## 2012-09-20 DIAGNOSIS — K3184 Gastroparesis: Secondary | ICD-10-CM

## 2012-09-20 DIAGNOSIS — G609 Hereditary and idiopathic neuropathy, unspecified: Secondary | ICD-10-CM

## 2012-09-20 DIAGNOSIS — R1013 Epigastric pain: Secondary | ICD-10-CM

## 2012-09-20 DIAGNOSIS — K219 Gastro-esophageal reflux disease without esophagitis: Secondary | ICD-10-CM
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
## 2012-09-27 ENCOUNTER — Non-Acute Institutional Stay (SKILLED_NURSING_FACILITY): Payer: Medicare Other | Admitting: Internal Medicine

## 2012-09-27 DIAGNOSIS — K3184 Gastroparesis: Secondary | ICD-10-CM

## 2012-09-27 DIAGNOSIS — G8929 Other chronic pain: Secondary | ICD-10-CM

## 2012-09-27 DIAGNOSIS — K219 Gastro-esophageal reflux disease without esophagitis: Secondary | ICD-10-CM

## 2012-09-27 DIAGNOSIS — D638 Anemia in other chronic diseases classified elsewhere: Secondary | ICD-10-CM

## 2012-09-27 DIAGNOSIS — R1013 Epigastric pain: Secondary | ICD-10-CM

## 2012-09-27 NOTE — Progress Notes (Signed)
Patient ID: Emily [name], female   DOB: 01/06/1941, 71 y.o.   MRN: [id number] [facility].  History; this is a patient I admitted from home last week. Information I had was during an admission to [facility] in April of this year. At that point. She was admitted with generalized weakness, as well as abdominal pain. She has a chronically elevated alkaline phosphatase. An MRCP showed common bile duct tapering abruptly at the ampulla with dilation of the common bile duct. Her pancreatic enzymes were normal. A HIDA scan showed biliary patency. Nevertheless, it was felt that her common bile duct may not be emptying on an intermittent basis. She was referred to gastroenterology at [facility]. Once again, I am not certain where this consultation is. She continues to complain of episodic flank pain radiating into her groin. She describes this differently from what she told me last week in which her pain was more diffuse, both times, certainly not classically right upper quadrant pain. She continues to complain of frequent regurgitation. She eats very little with meals, and I believe is on nighttime feeding through her PEG tube on a continuous drip basis. She has a failed Nissan fundoplication with severe esophageal reflux.  Lab work has come back showing a white count of 8.5. Rest of her CBC is normal. Her hemoglobin was 11.3 during the hospitalization in April. Her comprehensive metabolic panel shows a glucose of 126, otherwise, sodium, potassium, BUN, and creatinine are normal. She has a slightly elevated alkaline phosphatase at 142. Her albumin is less than 2 indicative of severe protein malnutrition. Her total CKs 19. Hemoglobin A1c 4.7, [not indicative of diabetes,]  Review of systems; Respiratory no cough no shortness of breath. Cardiac no exertional chest pain. GI continued regurgitation rather than true vomiting. She is very little orally. Stage she tolerates her nocturnal feedings. I am not aware. She is  vomiting or high residuals at night. Today she is describing pain in her right greater than left abdominal flank, radiating towards the groin on both sides. She did not describe this last week. She is asking for stronger pain medication GU no dysuria or hematuria. Musculoskeletal some centrally located low back pain that does not radiate.  Physical exam; [id number] Respiratory clear entry bilaterally. Cardiac heart sounds are normal. No murmurs. She does not appear to be dehydrated. Abdomen large, epigastric scar presumably her Nissan fundoplication. She is diffusely tender, but once again, no guarding or rebound, exam is essentially the same as last week. Bowel sounds are positive. PEG site looks fine. There is no jaundice. No stigmata of chronic liver disease. GU no suprapubic or costovertebral angle tenderness, normal external female genitalia.   Impression/plan #1 continued complaints of abdominal pain, although the character of this is a bit different. Last week from this week. I will check a urine, especially for blood. She's appears to have a distal common bile duct issue. There can be functional problems here as well in this area. I would like to have her have continued GI consultation if not at [facility] then with GI. Local. #2 profound protein calorie malnutrition; or at least protein malnutrition. I am going to try to bolus feeding. Her during the day if her reflux will allow this. At protein supplement. #3 question diabetes. I see very little evidence of this. #4 acute renal failure, hypokalemia, and hyponatremia in hospital. All of this appears to resolved. #5 groin pain; I'm not really this sure what she is describing however, this seems different from last  week. I will check a urine for blood infection. #6 history of iron deficiency anemia; her hemoglobin is 8.5 normochromic, normocytic. Her problem list lists, iron deficiency. I am going to need to recheck this as her hemoglobin was 11.3  during her hospitalization and dropped to 8.3. At the time of her presumed discharge [April 18] therefore, this appears to have been reasonably stable. She is not on iron, this easily could be chronic disease/chronic inflammation. #7 severe chronic gastroesophageal reflux on omeprazole 20 twice a day, Carafate and Reglan.

## 2012-09-30 ENCOUNTER — Non-Acute Institutional Stay (SKILLED_NURSING_FACILITY): Payer: Medicare Other | Admitting: Adult Health

## 2012-09-30 DIAGNOSIS — R112 Nausea with vomiting, unspecified: Secondary | ICD-10-CM

## 2012-09-30 DIAGNOSIS — R1013 Epigastric pain: Secondary | ICD-10-CM

## 2012-09-30 DIAGNOSIS — N179 Acute kidney failure, unspecified: Secondary | ICD-10-CM

## 2012-09-30 DIAGNOSIS — I10 Essential (primary) hypertension: Secondary | ICD-10-CM

## 2012-10-10 ENCOUNTER — Ambulatory Visit (HOSPITAL_COMMUNITY)
Admission: RE | Admit: 2012-10-10 | Discharge: 2012-10-10 | Disposition: A | Discharge status: Home / Self Care | Payer: Medicare Other | Source: Ambulatory Visit | Attending: Interventional Radiology | Admitting: Interventional Radiology

## 2012-10-10 ENCOUNTER — Other Ambulatory Visit (HOSPITAL_COMMUNITY): Payer: Self-pay | Admitting: Interventional Radiology

## 2012-10-10 ENCOUNTER — Telehealth: Payer: Self-pay | Admitting: Internal Medicine

## 2012-10-10 DIAGNOSIS — K9403 Colostomy malfunction: Secondary | ICD-10-CM | POA: Insufficient documentation

## 2012-10-10 DIAGNOSIS — R633 Feeding difficulties: Secondary | ICD-10-CM

## 2012-10-10 DIAGNOSIS — R6339 Other feeding difficulties: Secondary | ICD-10-CM

## 2012-10-10 DIAGNOSIS — K9413 Enterostomy malfunction: Secondary | ICD-10-CM | POA: Insufficient documentation

## 2012-10-10 MED ORDER — IOHEXOL 300 MG/ML  SOLN
10.0000 mL | Freq: Once | INTRAMUSCULAR | Status: AC | PRN
  Administered 2012-10-10: 10 mL

## 2012-10-10 NOTE — Telephone Encounter (Signed)
Spoke with patient's daughter [name]. Patient is living with her. States the feeding tube has a hole and is leaking. Called IR and they will call patient and get her in for repair.

## 2012-10-10 NOTE — Telephone Encounter (Signed)
Left a message for patient to call me. 

## 2012-10-10 NOTE — Procedures (Signed)
Successful fluoroscopic guided replacement of 20 Fr jejunostomy tube.  No immediate post procedural complications.  The feeding tube is ready for immediate use.

## 2012-10-19 ENCOUNTER — Emergency Department (INDEPENDENT_AMBULATORY_CARE_PROVIDER_SITE_OTHER)
Admission: EM | Admit: 2012-10-19 | Discharge: 2012-10-19 | Disposition: A | Discharge status: Other Acute Inpatient Hospital | Payer: Medicare Other | Source: Home / Self Care | Attending: Family Medicine | Admitting: Family Medicine

## 2012-10-19 ENCOUNTER — Encounter (HOSPITAL_COMMUNITY): Payer: Self-pay

## 2012-10-19 ENCOUNTER — Emergency Department (HOSPITAL_COMMUNITY): Payer: Medicare Other

## 2012-10-19 ENCOUNTER — Encounter (HOSPITAL_COMMUNITY): Payer: Self-pay | Admitting: Family Medicine

## 2012-10-19 ENCOUNTER — Inpatient Hospital Stay (HOSPITAL_COMMUNITY)
Admission: EM | Admit: 2012-10-19 | Discharge: 2012-10-24 | DRG: 073 | Disposition: A | Discharge status: Home / Self Care | Payer: Medicare Other | Attending: Internal Medicine | Admitting: Internal Medicine

## 2012-10-19 DIAGNOSIS — R7989 Other specified abnormal findings of blood chemistry: Secondary | ICD-10-CM

## 2012-10-19 DIAGNOSIS — F3289 Other specified depressive episodes: Secondary | ICD-10-CM | POA: Diagnosis present

## 2012-10-19 DIAGNOSIS — D72829 Elevated white blood cell count, unspecified: Secondary | ICD-10-CM

## 2012-10-19 DIAGNOSIS — K5730 Diverticulosis of large intestine without perforation or abscess without bleeding: Secondary | ICD-10-CM | POA: Diagnosis present

## 2012-10-19 DIAGNOSIS — E119 Type 2 diabetes mellitus without complications: Secondary | ICD-10-CM | POA: Diagnosis present

## 2012-10-19 DIAGNOSIS — R1013 Epigastric pain: Secondary | ICD-10-CM

## 2012-10-19 DIAGNOSIS — D509 Iron deficiency anemia, unspecified: Secondary | ICD-10-CM

## 2012-10-19 DIAGNOSIS — F329 Major depressive disorder, single episode, unspecified: Secondary | ICD-10-CM

## 2012-10-19 DIAGNOSIS — R1084 Generalized abdominal pain: Secondary | ICD-10-CM

## 2012-10-19 DIAGNOSIS — K838 Other specified diseases of biliary tract: Secondary | ICD-10-CM

## 2012-10-19 DIAGNOSIS — K56609 Unspecified intestinal obstruction, unspecified as to partial versus complete obstruction: Secondary | ICD-10-CM

## 2012-10-19 DIAGNOSIS — K911 Postgastric surgery syndromes: Secondary | ICD-10-CM | POA: Diagnosis present

## 2012-10-19 DIAGNOSIS — R52 Pain, unspecified: Secondary | ICD-10-CM

## 2012-10-19 DIAGNOSIS — W3400XA Accidental discharge from unspecified firearms or gun, initial encounter: Secondary | ICD-10-CM

## 2012-10-19 DIAGNOSIS — R109 Unspecified abdominal pain: Secondary | ICD-10-CM

## 2012-10-19 DIAGNOSIS — N179 Acute kidney failure, unspecified: Secondary | ICD-10-CM

## 2012-10-19 DIAGNOSIS — F17200 Nicotine dependence, unspecified, uncomplicated: Secondary | ICD-10-CM | POA: Diagnosis present

## 2012-10-19 DIAGNOSIS — R197 Diarrhea, unspecified: Secondary | ICD-10-CM

## 2012-10-19 DIAGNOSIS — S62609A Fracture of unspecified phalanx of unspecified finger, initial encounter for closed fracture: Secondary | ICD-10-CM | POA: Diagnosis present

## 2012-10-19 DIAGNOSIS — E876 Hypokalemia: Secondary | ICD-10-CM

## 2012-10-19 DIAGNOSIS — R112 Nausea with vomiting, unspecified: Secondary | ICD-10-CM

## 2012-10-19 DIAGNOSIS — E1149 Type 2 diabetes mellitus with other diabetic neurological complication: Principal | ICD-10-CM | POA: Diagnosis present

## 2012-10-19 DIAGNOSIS — N390 Urinary tract infection, site not specified: Secondary | ICD-10-CM | POA: Diagnosis present

## 2012-10-19 DIAGNOSIS — E871 Hypo-osmolality and hyponatremia: Secondary | ICD-10-CM

## 2012-10-19 DIAGNOSIS — E669 Obesity, unspecified: Secondary | ICD-10-CM | POA: Diagnosis present

## 2012-10-19 DIAGNOSIS — G8929 Other chronic pain: Secondary | ICD-10-CM | POA: Diagnosis present

## 2012-10-19 DIAGNOSIS — K3184 Gastroparesis: Secondary | ICD-10-CM | POA: Diagnosis present

## 2012-10-19 DIAGNOSIS — K922 Gastrointestinal hemorrhage, unspecified: Secondary | ICD-10-CM

## 2012-10-19 DIAGNOSIS — I10 Essential (primary) hypertension: Secondary | ICD-10-CM | POA: Diagnosis present

## 2012-10-19 DIAGNOSIS — K219 Gastro-esophageal reflux disease without esophagitis: Secondary | ICD-10-CM

## 2012-10-19 DIAGNOSIS — M129 Arthropathy, unspecified: Secondary | ICD-10-CM | POA: Diagnosis present

## 2012-10-19 DIAGNOSIS — D638 Anemia in other chronic diseases classified elsewhere: Secondary | ICD-10-CM | POA: Diagnosis present

## 2012-10-19 DIAGNOSIS — K224 Dyskinesia of esophagus: Secondary | ICD-10-CM | POA: Diagnosis present

## 2012-10-19 DIAGNOSIS — Z903 Acquired absence of stomach [part of]: Secondary | ICD-10-CM

## 2012-10-19 DIAGNOSIS — G4730 Sleep apnea, unspecified: Secondary | ICD-10-CM | POA: Diagnosis present

## 2012-10-19 DIAGNOSIS — F1010 Alcohol abuse, uncomplicated: Secondary | ICD-10-CM | POA: Diagnosis present

## 2012-10-19 DIAGNOSIS — J189 Pneumonia, unspecified organism: Secondary | ICD-10-CM | POA: Diagnosis present

## 2012-10-19 DIAGNOSIS — R710 Precipitous drop in hematocrit: Secondary | ICD-10-CM | POA: Diagnosis present

## 2012-10-19 DIAGNOSIS — S21109A Unspecified open wound of unspecified front wall of thorax without penetration into thoracic cavity, initial encounter: Secondary | ICD-10-CM | POA: Diagnosis present

## 2012-10-19 HISTORY — DX: Alcohol abuse, uncomplicated: F10.10

## 2012-10-19 LAB — URINE MICROSCOPIC-ADD ON

## 2012-10-19 LAB — COMPREHENSIVE METABOLIC PANEL
ALT: 10 U/L (ref 0–35)
AST: 23 U/L (ref 0–37)
Albumin: 2.6 g/dL — ABNORMAL LOW (ref 3.5–5.2)
Alkaline Phosphatase: 126 U/L — ABNORMAL HIGH (ref 39–117)
BUN: 12 mg/dL (ref 6–23)
CO2: 24 mEq/L (ref 19–32)
Calcium: 9.1 mg/dL (ref 8.4–10.5)
Chloride: 109 mEq/L (ref 96–112)
Creatinine, Ser: 0.62 mg/dL (ref 0.50–1.10)
GFR calc Af Amer: >90 mL/min (ref >90)
GFR calc non Af Amer: 88 mL/min — ABNORMAL LOW (ref >90)
Glucose, Bld: 83 mg/dL (ref 70–99)
Potassium: 4.2 mEq/L (ref 3.5–5.1)
Sodium: 143 mEq/L (ref 135–145)
Total Bilirubin: 0.4 mg/dL (ref 0.3–1.2)
Total Protein: 7.6 g/dL (ref 6.0–8.3)

## 2012-10-19 LAB — URINALYSIS, ROUTINE W REFLEX MICROSCOPIC
Bilirubin Urine: NEGATIVE
Glucose, UA: NEGATIVE mg/dL
Hgb urine dipstick: NEGATIVE
Ketones, ur: 15 mg/dL — AB
Nitrite: NEGATIVE
Protein, ur: NEGATIVE mg/dL
Specific Gravity, Urine: 1.024 (ref 1.005–1.030)
Urobilinogen, UA: 1 mg/dL (ref 0.0–1.0)
pH: 6 (ref 5.0–8.0)

## 2012-10-19 LAB — CBC WITH DIFFERENTIAL/PLATELET
Basophils Absolute: 0 10*3/uL (ref 0.0–0.1)
Basophils Relative: 0 % (ref 0–1)
Eosinophils Absolute: 0.2 10*3/uL (ref 0.0–0.7)
Eosinophils Relative: 2 % (ref 0–5)
HCT: 35.1 % — ABNORMAL LOW (ref 36.0–46.0)
Hemoglobin: 11.8 g/dL — ABNORMAL LOW (ref 12.0–15.0)
Lymphocytes Relative: 21 % (ref 12–46)
Lymphs Abs: 2.1 10*3/uL (ref 0.7–4.0)
MCH: 26 pg (ref 26.0–34.0)
MCHC: 33.6 g/dL (ref 30.0–36.0)
MCV: 77.5 fL — ABNORMAL LOW (ref 78.0–100.0)
Monocytes Absolute: 0.8 10*3/uL (ref 0.1–1.0)
Monocytes Relative: 8 % (ref 3–12)
Neutro Abs: 6.9 10*3/uL (ref 1.7–7.7)
Neutrophils Relative %: 70 % (ref 43–77)
Platelets: 384 10*3/uL (ref 150–400)
RBC: 4.53 MIL/uL (ref 3.87–5.11)
RDW: 13.7 % (ref 11.5–15.5)
WBC: 10 10*3/uL (ref 4.0–10.5)

## 2012-10-19 LAB — LIPASE, BLOOD: Lipase: 35 U/L (ref 11–59)

## 2012-10-19 MED ORDER — MORPHINE SULFATE 2 MG/ML IJ SOLN
2.0000 mg | INTRAMUSCULAR | Status: AC
  Administered 2012-10-19: 2 mg via INTRAVENOUS
  Filled 2012-10-19: qty 1

## 2012-10-19 MED ORDER — SODIUM CHLORIDE 0.9 % IV SOLN
Freq: Once | INTRAVENOUS | Status: AC
  Administered 2012-10-19: 23:00:00 via INTRAVENOUS

## 2012-10-19 MED ORDER — IOHEXOL 300 MG/ML  SOLN
100.0000 mL | Freq: Once | INTRAMUSCULAR | Status: AC | PRN
  Administered 2012-10-19: 100 mL via INTRAVENOUS

## 2012-10-19 MED ORDER — IOHEXOL 300 MG/ML  SOLN
25.0000 mL | INTRAMUSCULAR | Status: AC
  Administered 2012-10-19: 25 mL via ORAL

## 2012-10-19 MED ORDER — ONDANSETRON 4 MG PO TBDP
8.0000 mg | ORAL_TABLET | ORAL | Status: AC
  Administered 2012-10-19: 8 mg via ORAL
  Filled 2012-10-19: qty 2

## 2012-10-19 MED ORDER — ONDANSETRON HCL 4 MG/2ML IJ SOLN
4.0000 mg | Freq: Once | INTRAMUSCULAR | Status: AC
  Administered 2012-10-19: 4 mg via INTRAVENOUS
  Filled 2012-10-19: qty 2

## 2012-10-19 MED ORDER — DEXTROSE 5 % IV SOLN
1.0000 g | Freq: Once | INTRAVENOUS | Status: AC
  Administered 2012-10-19: 1 g via INTRAVENOUS
  Filled 2012-10-19: qty 10

## 2012-10-19 MED ORDER — MORPHINE SULFATE 2 MG/ML IJ SOLN
2.0000 mg | INTRAMUSCULAR | Status: DC | PRN
  Administered 2012-10-20: 2 mg via INTRAVENOUS
  Filled 2012-10-19: qty 1

## 2012-10-19 MED ORDER — MORPHINE SULFATE 4 MG/ML IJ SOLN
4.0000 mg | INTRAMUSCULAR | Status: AC
  Administered 2012-10-19: 4 mg via INTRAVENOUS
  Filled 2012-10-19: qty 1

## 2012-10-19 MED ORDER — LEVOFLOXACIN IN D5W 750 MG/150ML IV SOLN
750.0000 mg | INTRAVENOUS | Status: DC
  Administered 2012-10-19 – 2012-10-22 (×4): 750 mg via INTRAVENOUS
  Filled 2012-10-19 (×5): qty 150

## 2012-10-19 NOTE — ED Notes (Signed)
Daily loose BM's, blood w stool one occurrence; pain transverse abdominal area , feeding tube left upper abdominal area

## 2012-10-19 NOTE — ED Notes (Signed)
Pt sent here from [facility] with abdominal pain, diarrhea and 1 episode of blood in stool. Pt has feeding tube. sts the diarrhea has been intermittent and has been taking medication. sts some nausea.

## 2012-10-19 NOTE — ED Provider Notes (Signed)
[name] 8:00 PM patient discussed in sign out with Dr. Adriana Simas. Patient with history of gastrectomy and multiple abdominal procedures presenting with complaints of abdominal pains. Patient also has slight tachycardia.CT scan is pending. Patient also with findings of UTI and at this time plans for patient to be admitted for continued evaluation.  Spoke with hospitalist. They will see patient admit to regular bed under team 10. Would like Levaquin ordered per pharmacy for possible pneumonia. They also request a general surgery consult for possible obstruction.   Spoke with Dr. Gwinda Passe with general surgery.  He will follow pt in consultation.  Would like pt to remain NPO.  Angus Seller, PA-C 10/19/12 2251

## 2012-10-19 NOTE — ED Notes (Signed)
Attempted to start PIV x 2. Pt informed RN post iv stick attempts that IV team has to start her IVs. IV Team paged.

## 2012-10-19 NOTE — ED Notes (Signed)
Pt reports mid abdominal pain that started last night. States pain is sharp and cramping in nature. Pt has a peg tube in LLQ of abdomen. Pt states that she took two tylenol around 12pm this afternoon with no relief. Pt has a history of pancreatitis and diverticulosis. Pt reports nausea and vomiting with the abdominal pain.
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
## 2012-10-19 NOTE — ED Notes (Addendum)
Pt insisted on drinking contrast prior to receiving medication for nausea. Threw up half the container of contrast. Informed CT of such action.

## 2012-10-19 NOTE — ED Notes (Signed)
Patient transported to CT 
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
## 2012-10-20 ENCOUNTER — Encounter (HOSPITAL_COMMUNITY): Payer: Self-pay | Admitting: Internal Medicine

## 2012-10-20 DIAGNOSIS — G8929 Other chronic pain: Secondary | ICD-10-CM | POA: Diagnosis present

## 2012-10-20 DIAGNOSIS — N390 Urinary tract infection, site not specified: Secondary | ICD-10-CM

## 2012-10-20 DIAGNOSIS — R109 Unspecified abdominal pain: Secondary | ICD-10-CM

## 2012-10-20 DIAGNOSIS — R197 Diarrhea, unspecified: Secondary | ICD-10-CM

## 2012-10-20 DIAGNOSIS — R112 Nausea with vomiting, unspecified: Secondary | ICD-10-CM

## 2012-10-20 LAB — CBC
HCT: 33.3 % — ABNORMAL LOW (ref 36.0–46.0)
Hemoglobin: 11.2 g/dL — ABNORMAL LOW (ref 12.0–15.0)
MCH: 26.2 pg (ref 26.0–34.0)
MCHC: 33.6 g/dL (ref 30.0–36.0)
MCV: 78 fL (ref 78.0–100.0)
Platelets: 393 10*3/uL (ref 150–400)
RBC: 4.27 MIL/uL (ref 3.87–5.11)
RDW: 13.7 % (ref 11.5–15.5)
WBC: 11.1 10*3/uL — ABNORMAL HIGH (ref 4.0–10.5)

## 2012-10-20 LAB — BASIC METABOLIC PANEL
BUN: 7 mg/dL (ref 6–23)
CO2: 20 mEq/L (ref 19–32)
Calcium: 8.3 mg/dL — ABNORMAL LOW (ref 8.4–10.5)
Chloride: 105 mEq/L (ref 96–112)
Creatinine, Ser: 0.55 mg/dL (ref 0.50–1.10)
GFR calc Af Amer: >90 mL/min (ref >90)
GFR calc non Af Amer: >90 mL/min (ref >90)
Glucose, Bld: 93 mg/dL (ref 70–99)
Potassium: 5.7 mEq/L — ABNORMAL HIGH (ref 3.5–5.1)
Sodium: 133 mEq/L — ABNORMAL LOW (ref 135–145)

## 2012-10-20 LAB — GLUCOSE, CAPILLARY
Glucose-Capillary: 69 mg/dL — ABNORMAL LOW (ref 70–99)
Glucose-Capillary: 83 mg/dL (ref 70–99)
Glucose-Capillary: 83 mg/dL (ref 70–99)
Glucose-Capillary: 73 mg/dL (ref 70–99)

## 2012-10-20 LAB — HEMOGLOBIN A1C
Hgb A1c MFr Bld: 4.9 % (ref <5.7)
Mean Plasma Glucose: 94 mg/dL (ref <117)

## 2012-10-20 MED ORDER — ONDANSETRON HCL 4 MG PO TABS
4.0000 mg | ORAL_TABLET | Freq: Four times a day (QID) | ORAL | Status: DC | PRN
  Administered 2012-10-21 – 2012-10-23 (×5): 4 mg via ORAL
  Filled 2012-10-20 (×5): qty 1

## 2012-10-20 MED ORDER — METOCLOPRAMIDE HCL 5 MG PO TABS
5.0000 mg | ORAL_TABLET | Freq: Three times a day (TID) | ORAL | Status: DC
  Administered 2012-10-20 – 2012-10-24 (×12): 5 mg
  Filled 2012-10-20 (×15): qty 1

## 2012-10-20 MED ORDER — ACETAMINOPHEN 650 MG RE SUPP: 650.0000 mg | Freq: Four times a day (QID) | RECTAL | Status: DC | PRN

## 2012-10-20 MED ORDER — SODIUM CHLORIDE 0.9 % IV SOLN: INTRAVENOUS | Status: AC

## 2012-10-20 MED ORDER — HYDRALAZINE HCL 20 MG/ML IJ SOLN: 10.0000 mg | INTRAMUSCULAR | Status: DC | PRN

## 2012-10-20 MED ORDER — LORAZEPAM 0.5 MG PO TABS
0.5000 mg | ORAL_TABLET | Freq: Three times a day (TID) | ORAL | Status: DC | PRN
  Administered 2012-10-20 – 2012-10-24 (×8): 0.5 mg via ORAL
  Filled 2012-10-20 (×8): qty 1

## 2012-10-20 MED ORDER — SUCRALFATE 1 GM/10ML PO SUSP
1.0000 g | Freq: Two times a day (BID) | ORAL | Status: DC
  Administered 2012-10-20 – 2012-10-24 (×10): 1 g
  Filled 2012-10-20 (×11): qty 10

## 2012-10-20 MED ORDER — METRONIDAZOLE IN NACL 5-0.79 MG/ML-% IV SOLN
500.0000 mg | Freq: Three times a day (TID) | INTRAVENOUS | Status: DC
  Filled 2012-10-20 (×2): qty 100

## 2012-10-20 MED ORDER — ENOXAPARIN SODIUM 40 MG/0.4ML ~~LOC~~ SOLN
40.0000 mg | SUBCUTANEOUS | Status: DC
  Administered 2012-10-20 – 2012-10-24 (×5): 40 mg via SUBCUTANEOUS
  Filled 2012-10-20 (×5): qty 0.4

## 2012-10-20 MED ORDER — INSULIN ASPART 100 UNIT/ML ~~LOC~~ SOLN
0.0000 [IU] | Freq: Three times a day (TID) | SUBCUTANEOUS | Status: DC
  Administered 2012-10-24: 2 [IU] via SUBCUTANEOUS

## 2012-10-20 MED ORDER — MORPHINE SULFATE 2 MG/ML IJ SOLN
2.0000 mg | INTRAMUSCULAR | Status: DC | PRN
  Administered 2012-10-20 – 2012-10-24 (×9): 2 mg via INTRAVENOUS
  Filled 2012-10-20 (×9): qty 1

## 2012-10-20 MED ORDER — PANTOPRAZOLE SODIUM 40 MG PO TBEC
40.0000 mg | DELAYED_RELEASE_TABLET | Freq: Every day | ORAL | Status: DC
  Administered 2012-10-20 – 2012-10-24 (×5): 40 mg via ORAL
  Filled 2012-10-20 (×5): qty 1

## 2012-10-20 MED ORDER — ONDANSETRON HCL 4 MG/2ML IJ SOLN
4.0000 mg | Freq: Four times a day (QID) | INTRAMUSCULAR | Status: DC | PRN
  Administered 2012-10-20 – 2012-10-24 (×7): 4 mg via INTRAVENOUS
  Filled 2012-10-20 (×8): qty 2

## 2012-10-20 MED ORDER — MORPHINE SULFATE 2 MG/ML IJ SOLN: 2.0000 mg | INTRAMUSCULAR | Status: DC | PRN

## 2012-10-20 MED ORDER — ACETAMINOPHEN 325 MG PO TABS
650.0000 mg | ORAL_TABLET | Freq: Four times a day (QID) | ORAL | Status: DC | PRN
  Administered 2012-10-20 – 2012-10-24 (×10): 650 mg via ORAL
  Filled 2012-10-20 (×11): qty 2

## 2012-10-20 MED ORDER — METRONIDAZOLE IN NACL 5-0.79 MG/ML-% IV SOLN
500.0000 mg | Freq: Three times a day (TID) | INTRAVENOUS | Status: DC
  Administered 2012-10-20 (×2): 500 mg via INTRAVENOUS
  Filled 2012-10-20 (×4): qty 100

## 2012-10-20 MED ORDER — DEXTROSE 50 % IV SOLN
INTRAVENOUS | Status: AC
  Administered 2012-10-20: 50 mL
  Filled 2012-10-20: qty 50

## 2012-10-20 MED ORDER — FREE WATER
150.0000 mL | Freq: Three times a day (TID) | Status: DC
  Administered 2012-10-20 – 2012-10-23 (×11): 150 mL

## 2012-10-20 NOTE — ED Provider Notes (Signed)
Medical screening examination/treatment/procedure(s) were performed by non-physician practitioner and as supervising physician I was immediately available for consultation/collaboration.  Audriana Aldama R. Zilah Villaflor, MD 10/20/12 2308 

## 2012-10-20 NOTE — Progress Notes (Signed)
[facility] PROGRESS NOTE  Emily [name] BJY:[id number] DOB: 04-12-1941 DOA: 10/19/2012 PCP: Dorrene German, MD  Assessment/Plan: SBO/N/V/Diarrhea -No further emesis or diarrhea since admission. -Will give clears today and continue to advance diet as tolerated. -If tolerates diet, can potentially DC home soon.  UTI -Continue levaquin pending culture data.  ?PNA -Doubt given absence of symptoms. -DC flagyl, continue levaquin for UTI.  DM -Well controlled.  Code Status: Full code Family Communication: Patient Only Disposition Plan: Likely home in 24-48 hours.   Consultants:  None   Antibiotics:  Levaquin day 2  Flagyl day 2 (will discontinue today).   Subjective: No complaints today.  Objective: Filed Vitals:   10/19/12 2015 10/19/12 2138 10/19/12 2350 10/20/12 0515  BP: 116/70  143/67 138/60  Pulse: 95  98 76  Temp:   98.3 F (36.8 C) 98.5 F (36.9 C)  TempSrc:      Resp:   16 16  Height:  5' 4.17" (1.63 m)    Weight:  70.8 kg (156 lb 1.4 oz)    SpO2: 99%  100% 100%    Intake/Output Summary (Last 24 hours) at 10/20/12 1511 Last data filed at 10/20/12 0604  Gross per 24 hour  Intake 528.33 ml  Output    100 ml  Net 428.33 ml   Filed Weights   10/19/12 2138  Weight: 70.8 kg (156 lb 1.4 oz)    Exam:   General:  AA Ox3, NAD  Cardiovascular: RRR  Respiratory: CTA B  Abdomen: S/NT/ND/+BS/no masses or organomegaly noted.  Extremities: no C/C/E/+pedal pulses   Neurologic:  Grossly intact and non-focal.  Data Reviewed: Basic Metabolic Panel:  Recent Labs Lab 10/19/12 1733 10/20/12 0755  NA 143 133*  K 4.2 5.7*  CL 109 105  CO2 24 20  GLUCOSE 83 93  BUN 12 7  CREATININE 0.62 0.55  CALCIUM 9.1 8.3*   Liver Function Tests:  Recent Labs Lab 10/19/12 1733  AST 23  ALT 10  ALKPHOS 126*  BILITOT 0.4  PROT 7.6  ALBUMIN 2.6*    Recent Labs Lab 10/19/12 1733  LIPASE 35   No results found for this basename: AMMONIA,   in the last 168 hours CBC:  Recent Labs Lab 10/19/12 1733 10/20/12 1110  WBC 10.0 11.1*  NEUTROABS 6.9  --   HGB 11.8* 11.2*  HCT 35.1* 33.3*  MCV 77.5* 78.0  PLT 384 393   Cardiac Enzymes: No results found for this basename: CKTOTAL, CKMB, CKMBINDEX, TROPONINI,  in the last 168 hours BNP (last 3 results) No results found for this basename: PROBNP,  in the last 8760 hours CBG:  Recent Labs Lab 10/20/12 0658 10/20/12 0823 10/20/12 1118  GLUCAP 69* 83 83    No results found for this or any previous visit (from the past 240 hour(s)).   Studies: Ct Abdomen Pelvis W Contrast  10/19/2012   *RADIOLOGY REPORT*  Clinical Data: Abdominal pain.  CT ABDOMEN AND PELVIS WITH CONTRAST  Technique:  Multidetector CT imaging of the abdomen and pelvis was performed following the standard protocol during bolus administration of intravenous contrast.  Contrast: OMNIPAQUE IOHEXOL 300 MG/ML  SOLN  Comparison: CT abdomen pelvis with contrast 07/02/2012.  CT chest 08/15/2012  Findings: Small opacity peripherally in the inferior aspect of the right middle lobe likely corresponds to the previously described right middle lobe pneumonia on chest CT of 08/15/2012.  Although the entire right middle lobe is not imaged today, this have the appearance of the  imaged portion appears to demonstrate improved aeration.  Small opacity seen in the left lower lobe on image #1. This region of the left lower lobe was previously clear.  Mild, new focal opacities in the right lower lobe.  The distal esophagus is patulous/distended, and filled with oral contrast.  Where imaged, the distal esophageal measures 4.7 cm transverse diameter by 2.7 cm AP diameter.  Postsurgical changes of gastrectomy with an anastomosis of the distal esophagus with  jejunum.  This proximal jejunal loop at the level of the anastomosis is opacified with oral contrast and distended up to 5.2 cm on image number 21.  Distal to this, small bowel loops  are normal in caliber.  A jejunostomy tube in the left abdomen appears stable in position compared to the CT of the abdomen on March 2014.  There is extensive colonic diverticulosis.  No acute inflammatory changes in association with the diverticula.  The appendix is visualized.  The distal tip of the appendix measures up to 8 mm in diameter.  This appears similar to the CT of March 2014.  Gas is seen within the lumen of the appendix in its mid aspect.  There are no associated periappendiceal inflammatory changes.  No definite evidence of acute appendicitis.  Liver is within normal limits for size.  No focal hepatic lesion. Patient is status post cholecystectomy.  Prominence of the common bile duct is stable.  Common bile duct tapers distally, and was recently evaluated with MRCP (08/15/2012).  No filling defects are seen within the dilated common bile duct (measures up to approximately 11 mm).  The pancreas, spleen, adrenal glands, and left kidney within normal limits.  Stable 10 mm cyst upper pole right kidney.  Is stable too small to characterize low density lesions lower pole right kidney. Negative for hydronephrosis or perinephric stranding.  The uterus and adnexa are within normal limits.  Urinary bladder not very distended and unremarkable.  Negative for lymphadenopathy, ascites, or free air.  Abdominal aorta normal in caliber and demonstrates atherosclerotic changes.  No acute osseous abnormality.  IMPRESSION:  1. Status post gastrectomy.  Just beyond the anastomosis of the distal esophagus and jejunum, proximal jejunal loop is dilated to 5 cm.  It is difficult to exclude partial obstruction of the proximal jejunum. A follow-up upper GI examination may be useful. 2.  Dilated patulous distal esophagus appears similar to prior studies.  Distal esophagus is distended with oral contrast. Question if there is reflux of contrast from the proximal jejunum and/or esophageal dysmotility. 3.  Patchy airspace opacities  at the lung bases.  Overall, airspace disease in the right middle lobe appears improved.  There are small a few small new opacities in the lower lobes bilaterally.  Question if the patient could be aspirating. 4.  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  This was recently evaluated with MRCP.  No filling defects are seen within the common bile duct. 5.  Stable position of jejunostomy tube. 6.  The distal appendix measured at measures up to 8 mm, mildly prominent.  This appears similar to a prior abdomen pelvis CT, and there are no periappendiceal inflammatory changes. If there is any clinical concern for acute appendicitis, short-term follow-up imaging could be considered. 7.  Colonic diverticulosis, without acute inflammatory changes.   Original Report Authenticated By: Britta Mccreedy, M.D.    Scheduled Meds: . enoxaparin (LOVENOX) injection  40 mg Subcutaneous Q24H  . free water  150 mL Per Tube TID  . insulin  aspart  0-9 Units Subcutaneous TID WC  . levofloxacin (LEVAQUIN) IV  750 mg Intravenous Q24H  . metoCLOPramide  5 mg Per Tube TID  . metronidazole  500 mg Intravenous Q8H  . pantoprazole  40 mg Oral Daily  . sucralfate  1 g Per Tube BID   Continuous Infusions: . sodium chloride 50 mL/hr at 10/20/12 0030    Principal Problem:   Abdominal pain Active Problems:   DM, UNCOMPLICATED, TYPE II   HYPERTENSION   Gastroparesis   Nausea with vomiting   UTI (urinary tract infection)   Diarrhea    Time spent: 35 minutes.    Chaya Jan  [facility] Pager [phone]  If 7PM-7AM, please contact night-coverage at [email or web address], password [facility] 10/20/2012, 3:11 PM  LOS: 1 day

## 2012-10-20 NOTE — Progress Notes (Addendum)
Hypoglycemic Event  CBG: 69 taken at 0700  Treatment: D50 IV 25 mL given at 0730  Symptoms: None  Follow-up CBG: Time: 0825 CBG Result: 83  Possible Reasons for Event: Other: NPO  Comments/MD notified: Dr. Ardyth Harps notified about event at 0710. Ordered D50 IV 25mL. Given at 0714 by Ronaldo Miyamoto see MAR    Ronen Bromwell, Randon Goldsmith  Remember to initiate Hypoglycemia Order Set & complete
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
## 2012-10-20 NOTE — Progress Notes (Signed)
UR COMPLETED  

## 2012-10-21 ENCOUNTER — Inpatient Hospital Stay (HOSPITAL_COMMUNITY): Payer: Medicare Other

## 2012-10-21 ENCOUNTER — Encounter (HOSPITAL_COMMUNITY): Payer: Self-pay | Admitting: Physician Assistant

## 2012-10-21 DIAGNOSIS — R1013 Epigastric pain: Secondary | ICD-10-CM

## 2012-10-21 DIAGNOSIS — R112 Nausea with vomiting, unspecified: Secondary | ICD-10-CM

## 2012-10-21 DIAGNOSIS — K224 Dyskinesia of esophagus: Secondary | ICD-10-CM | POA: Diagnosis present

## 2012-10-21 DIAGNOSIS — K56609 Unspecified intestinal obstruction, unspecified as to partial versus complete obstruction: Secondary | ICD-10-CM

## 2012-10-21 DIAGNOSIS — Z903 Acquired absence of stomach [part of]: Secondary | ICD-10-CM

## 2012-10-21 DIAGNOSIS — K838 Other specified diseases of biliary tract: Secondary | ICD-10-CM

## 2012-10-21 DIAGNOSIS — R109 Unspecified abdominal pain: Secondary | ICD-10-CM

## 2012-10-21 LAB — GLUCOSE, CAPILLARY
Glucose-Capillary: 72 mg/dL (ref 70–99)
Glucose-Capillary: 104 mg/dL — ABNORMAL HIGH (ref 70–99)
Glucose-Capillary: 81 mg/dL (ref 70–99)
Glucose-Capillary: 125 mg/dL — ABNORMAL HIGH (ref 70–99)
Glucose-Capillary: 104 mg/dL — ABNORMAL HIGH (ref 70–99)
Glucose-Capillary: 79 mg/dL (ref 70–99)
Glucose-Capillary: 119 mg/dL — ABNORMAL HIGH (ref 70–99)
Glucose-Capillary: 66 mg/dL — ABNORMAL LOW (ref 70–99)

## 2012-10-21 LAB — URINE CULTURE: Colony Count: >=100000

## 2012-10-21 LAB — CLOSTRIDIUM DIFFICILE BY PCR: Toxigenic C. Difficile by PCR: NEGATIVE

## 2012-10-21 MED ORDER — PROMETHAZINE HCL 25 MG RE SUPP: 12.5000 mg | Freq: Four times a day (QID) | RECTAL | Status: DC | PRN

## 2012-10-21 MED ORDER — DEXTROSE 50 % IV SOLN
INTRAVENOUS | Status: AC
  Filled 2012-10-21: qty 50

## 2012-10-21 MED ORDER — PROMETHAZINE HCL 25 MG/ML IJ SOLN
12.5000 mg | Freq: Four times a day (QID) | INTRAMUSCULAR | Status: DC | PRN
  Administered 2012-10-21 – 2012-10-24 (×6): 12.5 mg via INTRAVENOUS
  Filled 2012-10-21 (×7): qty 1

## 2012-10-21 MED ORDER — SODIUM CHLORIDE 0.9 % IJ SOLN
10.0000 mL | INTRAMUSCULAR | Status: DC | PRN
  Administered 2012-10-22 – 2012-10-23 (×2): 10 mL

## 2012-10-21 MED ORDER — PROMETHAZINE HCL 12.5 MG PO TABS
12.5000 mg | ORAL_TABLET | Freq: Four times a day (QID) | ORAL | Status: DC | PRN
  Administered 2012-10-21 – 2012-10-22 (×2): 12.5 mg via ORAL
  Filled 2012-10-21 (×4): qty 1

## 2012-10-21 MED ORDER — DEXTROSE 50 % IV SOLN
25.0000 mL | Freq: Once | INTRAVENOUS | Status: AC | PRN
  Administered 2012-10-21: 25 mL via INTRAVENOUS

## 2012-10-21 NOTE — Consult Note (Signed)
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  Underwent total gastrectomy.  Esophagojejunostomy with Roux-en-Y reconstruction.  Had feeding jejunostomy for poor by mouth tolerance.  Had jejunal tube exchanged earlier this month.  Has had episodes of intermittent nausea vomiting.  Some loose stools.  She drank all the oral contrast.  Contrast was going into the ileum, arguing against a complete small bowel obstruction.  However, the balloon seemed to be quite filled.  Patient probably is having a partial obstruction from that.  I went ahead and removed seven of cc out of balloon, leaving 5 mL in place.  Balloon resisted being pulled out.  Hopefully that may help things out.  I recommend getting x-rays to see if contrast going into the stomach.  With poor motility by endoscopy and CT scan of the esophagus, expect no normal by mouth with her anyway.    Not really anything surgically to do at this time.  The patient is stable.  There is no evidence of peritonitis, acute abdomen, nor shock.  There is no strong evidence of failure of improvement nor decline with current non-operative management.  There is no need for surgery at the present moment.  We will be available & check on the patient on Monday, call with questions..  Replace jejunostomy tube to gravity for now.  If x-rays look good and no evidence of obstruction, consider restarting tube feeds at trophic rates.  Tolerates that, advanced to goal.  Tolerates tube feeds,advance oral diet.  Patient is still struggling, could repeat endoscopy to make sure nothing else is going on.  However I suspect that is low yield given normal endoscopies done in the past year  Agree with holding off on nasojejunal tube at this time.  Would recommend diet education with post gastrectomy diet.  Would recommend increase his activity and bowel regimen as tolerated.

## 2012-10-21 NOTE — Progress Notes (Signed)
[facility] PROGRESS NOTE  Emily [name] AOZ:[id number] DOB: Dec 06, 1940 DOA: 10/19/2012 PCP: Dorrene German, MD  Assessment/Plan: SBO/N/V/Diarrhea -Acute on chronic. -CT scan reviewed. Cannot exclude jejunal obstruction above j-tube site. -She has been vomiting profusely overnight. -Have requested GI consult (they know her well) and surgical consult, in case surgical treatment needed for obstruction. -Have asked GI whether NG tube is indicated (?not sure we can suction from her j-tube). They will address. -Diarrhea is c-diff negative.  UTI -Cx with >100,000 colonies GNR. -Continue levaquin pending culture data.  ?PNA -Doubt given absence of symptoms. -DC flagyl, continue levaquin for UTI.  DM -Well controlled. -Continue current management.  Code Status: Full code Family Communication: Patient Only Disposition Plan: not medically cleared as of yet, but plan is for eventual DC home.   Consultants:  GI  Surgery  Antibiotics:  Levaquin day 2  Subjective: Has been vomiting all night long. Has had constant retching while I was in the room. "I feel terrible today".  Objective: Filed Vitals:   10/19/12 2350 10/20/12 0515 10/20/12 2119 10/21/12 0431  BP: 143/67 138/60 125/75 125/60  Pulse: 98 76 97 101  Temp: 98.3 F (36.8 C) 98.5 F (36.9 C) 98.3 F (36.8 C) 98.7 F (37.1 C)  TempSrc:   Oral Oral  Resp: 16 16 18 18   Height:      Weight:      SpO2: 100% 100% 100% 97%    Intake/Output Summary (Last 24 hours) at 10/21/12 1033 Last data filed at 10/21/12 0945  Gross per 24 hour  Intake    110 ml  Output      0 ml  Net    110 ml   Filed Weights   10/19/12 2138  Weight: 70.8 kg (156 lb 1.4 oz)    Exam:   General:  AA Ox3  Cardiovascular: RRR  Respiratory: CTA B  Abdomen: S/NT/ND/+BS/no masses or organomegaly noted.  Extremities: no C/C/E/+pedal pulses   Neurologic:  Grossly intact and non-focal.  Data Reviewed: Basic Metabolic  Panel:  Recent Labs Lab 10/19/12 1733 10/20/12 0755  NA 143 133*  K 4.2 5.7*  CL 109 105  CO2 24 20  GLUCOSE 83 93  BUN 12 7  CREATININE 0.62 0.55  CALCIUM 9.1 8.3*   Liver Function Tests:  Recent Labs Lab 10/19/12 1733  AST 23  ALT 10  ALKPHOS 126*  BILITOT 0.4  PROT 7.6  ALBUMIN 2.6*    Recent Labs Lab 10/19/12 1733  LIPASE 35   No results found for this basename: AMMONIA,  in the last 168 hours CBC:  Recent Labs Lab 10/19/12 1733 10/20/12 1110  WBC 10.0 11.1*  NEUTROABS 6.9  --   HGB 11.8* 11.2*  HCT 35.1* 33.3*  MCV 77.5* 78.0  PLT 384 393   Cardiac Enzymes: No results found for this basename: CKTOTAL, CKMB, CKMBINDEX, TROPONINI,  in the last 168 hours BNP (last 3 results) No results found for this basename: PROBNP,  in the last 8760 hours CBG:  Recent Labs Lab 10/20/12 1555 10/20/12 2139 10/20/12 2302 10/21/12 0435 10/21/12 0738  GLUCAP 73 72 104* 81 125*    Recent Results (from the past 240 hour(s))  URINE CULTURE     Status: None   Collection Time    10/19/12  6:06 PM      Result Value Range Status   Specimen Description URINE, CLEAN CATCH   Final   Special Requests NONE   Final   Culture  Setup  Time 10/19/2012 20:24   Final   Colony Count >=100,000 COLONIES/ML   Final   Culture GRAM NEGATIVE RODS   Final   Report Status PENDING   Incomplete  CLOSTRIDIUM DIFFICILE BY PCR     Status: None   Collection Time    10/21/12  7:54 AM      Result Value Range Status   C difficile by pcr NEGATIVE  NEGATIVE Final     Studies: Ct Abdomen Pelvis W Contrast  10/19/2012   *RADIOLOGY REPORT*  Clinical Data: Abdominal pain.  CT ABDOMEN AND PELVIS WITH CONTRAST  Technique:  Multidetector CT imaging of the abdomen and pelvis was performed following the standard protocol during bolus administration of intravenous contrast.  Contrast: OMNIPAQUE IOHEXOL 300 MG/ML  SOLN  Comparison: CT abdomen pelvis with contrast 07/02/2012.  CT chest  08/15/2012  Findings: Small opacity peripherally in the inferior aspect of the right middle lobe likely corresponds to the previously described right middle lobe pneumonia on chest CT of 08/15/2012.  Although the entire right middle lobe is not imaged today, this have the appearance of the imaged portion appears to demonstrate improved aeration.  Small opacity seen in the left lower lobe on image #1. This region of the left lower lobe was previously clear.  Mild, new focal opacities in the right lower lobe.  The distal esophagus is patulous/distended, and filled with oral contrast.  Where imaged, the distal esophageal measures 4.7 cm transverse diameter by 2.7 cm AP diameter.  Postsurgical changes of gastrectomy with an anastomosis of the distal esophagus with  jejunum.  This proximal jejunal loop at the level of the anastomosis is opacified with oral contrast and distended up to 5.2 cm on image number 21.  Distal to this, small bowel loops are normal in caliber.  A jejunostomy tube in the left abdomen appears stable in position compared to the CT of the abdomen on March 2014.  There is extensive colonic diverticulosis.  No acute inflammatory changes in association with the diverticula.  The appendix is visualized.  The distal tip of the appendix measures up to 8 mm in diameter.  This appears similar to the CT of March 2014.  Gas is seen within the lumen of the appendix in its mid aspect.  There are no associated periappendiceal inflammatory changes.  No definite evidence of acute appendicitis.  Liver is within normal limits for size.  No focal hepatic lesion. Patient is status post cholecystectomy.  Prominence of the common bile duct is stable.  Common bile duct tapers distally, and was recently evaluated with MRCP (08/15/2012).  No filling defects are seen within the dilated common bile duct (measures up to approximately 11 mm).  The pancreas, spleen, adrenal glands, and left kidney within normal limits.  Stable  10 mm cyst upper pole right kidney.  Is stable too small to characterize low density lesions lower pole right kidney. Negative for hydronephrosis or perinephric stranding.  The uterus and adnexa are within normal limits.  Urinary bladder not very distended and unremarkable.  Negative for lymphadenopathy, ascites, or free air.  Abdominal aorta normal in caliber and demonstrates atherosclerotic changes.  No acute osseous abnormality.  IMPRESSION:  1. Status post gastrectomy.  Just beyond the anastomosis of the distal esophagus and jejunum, proximal jejunal loop is dilated to 5 cm.  It is difficult to exclude partial obstruction of the proximal jejunum. A follow-up upper GI examination may be useful. 2.  Dilated patulous distal esophagus appears similar  to prior studies.  Distal esophagus is distended with oral contrast. Question if there is reflux of contrast from the proximal jejunum and/or esophageal dysmotility. 3.  Patchy airspace opacities at the lung bases.  Overall, airspace disease in the right middle lobe appears improved.  There are small a few small new opacities in the lower lobes bilaterally.  Question if the patient could be aspirating. 4.  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  This was recently evaluated with MRCP.  No filling defects are seen within the common bile duct. 5.  Stable position of jejunostomy tube. 6.  The distal appendix measured at measures up to 8 mm, mildly prominent.  This appears similar to a prior abdomen pelvis CT, and there are no periappendiceal inflammatory changes. If there is any clinical concern for acute appendicitis, short-term follow-up imaging could be considered. 7.  Colonic diverticulosis, without acute inflammatory changes.   Original Report Authenticated By: Britta Mccreedy, M.D.    Scheduled Meds: . enoxaparin (LOVENOX) injection  40 mg Subcutaneous Q24H  . free water  150 mL Per Tube TID  . insulin aspart  0-9 Units Subcutaneous TID WC  . levofloxacin  (LEVAQUIN) IV  750 mg Intravenous Q24H  . metoCLOPramide  5 mg Per Tube TID  . pantoprazole  40 mg Oral Daily  . sucralfate  1 g Per Tube BID   Continuous Infusions:    Principal Problem:   Abdominal pain Active Problems:   DM, UNCOMPLICATED, TYPE II   HYPERTENSION   Gastroparesis   Nausea with vomiting   UTI (urinary tract infection)   Diarrhea   SBO (small bowel obstruction)    Time spent: 35 minutes.    Chaya Jan  [facility] Pager [phone]  If 7PM-7AM, please contact night-coverage at [email or web address], password [facility] 10/21/2012, 10:33 AM  LOS: 2 days
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
## 2012-10-21 NOTE — Progress Notes (Signed)
Peripherally Inserted Central Catheter/Midline Placement  The IV Nurse has discussed with the patient and/or persons authorized to consent for the patient, the purpose of this procedure and the potential benefits and risks involved with this procedure.  The benefits include less needle sticks, lab draws from the catheter and patient may be discharged home with the catheter.  Risks include, but not limited to, infection, bleeding, blood clot (thrombus formation), and puncture of an artery; nerve damage and irregular heat beat.  Alternatives to this procedure were also discussed.  PICC/Midline Placement Documentation        Stacie Glaze Horton 10/21/2012, 2:27 PM

## 2012-10-22 LAB — CBC
HCT: 27.1 % — ABNORMAL LOW (ref 36.0–46.0)
Hemoglobin: 9 g/dL — ABNORMAL LOW (ref 12.0–15.0)
MCH: 25.9 pg — ABNORMAL LOW (ref 26.0–34.0)
MCHC: 33.2 g/dL (ref 30.0–36.0)
MCV: 78.1 fL (ref 78.0–100.0)
Platelets: 343 10*3/uL (ref 150–400)
RBC: 3.47 MIL/uL — ABNORMAL LOW (ref 3.87–5.11)
RDW: 13.6 % (ref 11.5–15.5)
WBC: 6.3 10*3/uL (ref 4.0–10.5)

## 2012-10-22 LAB — COMPREHENSIVE METABOLIC PANEL
ALT: 8 U/L (ref 0–35)
AST: 17 U/L (ref 0–37)
Albumin: 1.8 g/dL — ABNORMAL LOW (ref 3.5–5.2)
Alkaline Phosphatase: 98 U/L (ref 39–117)
BUN: 3 mg/dL — ABNORMAL LOW (ref 6–23)
CO2: 24 mEq/L (ref 19–32)
Calcium: 7.8 mg/dL — ABNORMAL LOW (ref 8.4–10.5)
Chloride: 106 mEq/L (ref 96–112)
Creatinine, Ser: 0.59 mg/dL (ref 0.50–1.10)
GFR calc Af Amer: >90 mL/min (ref >90)
GFR calc non Af Amer: 89 mL/min — ABNORMAL LOW (ref >90)
Glucose, Bld: 64 mg/dL — ABNORMAL LOW (ref 70–99)
Potassium: 2.9 mEq/L — ABNORMAL LOW (ref 3.5–5.1)
Sodium: 138 mEq/L (ref 135–145)
Total Bilirubin: 0.2 mg/dL — ABNORMAL LOW (ref 0.3–1.2)
Total Protein: 5.6 g/dL — ABNORMAL LOW (ref 6.0–8.3)

## 2012-10-22 LAB — GLUCOSE, CAPILLARY
Glucose-Capillary: 116 mg/dL — ABNORMAL HIGH (ref 70–99)
Glucose-Capillary: 62 mg/dL — ABNORMAL LOW (ref 70–99)
Glucose-Capillary: 70 mg/dL (ref 70–99)
Glucose-Capillary: 77 mg/dL (ref 70–99)
Glucose-Capillary: 78 mg/dL (ref 70–99)
Glucose-Capillary: 87 mg/dL (ref 70–99)
Glucose-Capillary: 89 mg/dL (ref 70–99)
Glucose-Capillary: 94 mg/dL (ref 70–99)

## 2012-10-22 MED ORDER — OSMOLITE 1.2 CAL PO LIQD
1000.0000 mL | ORAL | Status: DC
  Administered 2012-10-22 – 2012-10-24 (×3): 1000 mL
  Filled 2012-10-22 (×8): qty 1000

## 2012-10-22 MED ORDER — DEXTROSE 50 % IV SOLN
25.0000 mL | Freq: Once | INTRAVENOUS | Status: AC | PRN
  Administered 2012-10-22: 03:00:00 via INTRAVENOUS

## 2012-10-22 MED ORDER — JEVITY 1.2 CAL PO LIQD
1000.0000 mL | ORAL | Status: DC
  Filled 2012-10-22 (×3): qty 1000

## 2012-10-22 MED ORDER — DEXTROSE 50 % IV SOLN
INTRAVENOUS | Status: AC
  Filled 2012-10-22: qty 50

## 2012-10-22 NOTE — Progress Notes (Signed)
[facility] PROGRESS NOTE  Emily [name] QIO:[id number] DOB: June 25, 1940 DOA: 10/19/2012 PCP: Dorrene German, MD  Assessment/Plan: SBO/N/V/Diarrhea -Acute on chronic. -Improved overnight after CCS removed some fluid from the j-tube balloon. -Still is mildly nauseated (which appears at baseline for her) but has not had any emesis this am or last night. -Will start tube feedings per protocol; if she tolerates can advance PO diet.  UTI -Cx with Klebsiella -Continue levaquin for 7 days total.  ?PNA -Doubt given absence of symptoms. -DC flagyl, continue levaquin for UTI.  DM -Well controlled. -Continue current management.  Code Status: Full code Family Communication: Patient Only Disposition Plan: not medically cleared as of yet, but plan is for eventual DC home.   Consultants:  GI  Surgery  Antibiotics:  Levaquin day 3  Subjective: Feels a little better today; no emesis this am or last night.  Objective: Filed Vitals:   10/21/12 0431 10/21/12 1447 10/21/12 2158 10/22/12 0518  BP: 125/60 136/63 138/74 128/63  Pulse: 101 95 91 90  Temp: 98.7 F (37.1 C) 98 F (36.7 C) 98.6 F (37 C) 98.8 F (37.1 C)  TempSrc: Oral Oral    Resp: 18 18 16 16   Height:      Weight:      SpO2: 97% 100% 99% 98%    Intake/Output Summary (Last 24 hours) at 10/22/12 1058 Last data filed at 10/22/12 0446  Gross per 24 hour  Intake    150 ml  Output    750 ml  Net   -600 ml   Filed Weights   10/19/12 2138  Weight: 70.8 kg (156 lb 1.4 oz)    Exam:   General:  AA Ox3  Cardiovascular: RRR  Respiratory: CTA B  Abdomen: S/NT/ND/+BS/no masses or organomegaly noted.  Extremities: no C/C/E/+pedal pulses   Neurologic:  Grossly intact and non-focal.  Data Reviewed: Basic Metabolic Panel:  Recent Labs Lab 10/19/12 1733 10/20/12 0755 10/22/12 0500  NA 143 133* 138  K 4.2 5.7* 2.9*  CL 109 105 106  CO2 24 20 24   GLUCOSE 83 93 64*  BUN 12 7 3*  CREATININE  0.62 0.55 0.59  CALCIUM 9.1 8.3* 7.8*   Liver Function Tests:  Recent Labs Lab 10/19/12 1733 10/22/12 0500  AST 23 17  ALT 10 8  ALKPHOS 126* 98  BILITOT 0.4 0.2*  PROT 7.6 5.6*  ALBUMIN 2.6* 1.8*    Recent Labs Lab 10/19/12 1733  LIPASE 35   No results found for this basename: AMMONIA,  in the last 168 hours CBC:  Recent Labs Lab 10/19/12 1733 10/20/12 1110 10/22/12 0500  WBC 10.0 11.1* 6.3  NEUTROABS 6.9  --   --   HGB 11.8* 11.2* 9.0*  HCT 35.1* 33.3* 27.1*  MCV 77.5* 78.0 78.1  PLT 384 393 343   Cardiac Enzymes: No results found for this basename: CKTOTAL, CKMB, CKMBINDEX, TROPONINI,  in the last 168 hours BNP (last 3 results) No results found for this basename: PROBNP,  in the last 8760 hours CBG:  Recent Labs Lab 10/21/12 2019 10/22/12 0046 10/22/12 0225 10/22/12 0408 10/22/12 0847  GLUCAP 119* 78 70 77 94    Recent Results (from the past 240 hour(s))  URINE CULTURE     Status: None   Collection Time    10/19/12  6:06 PM      Result Value Range Status   Specimen Description URINE, CLEAN CATCH   Final   Special Requests NONE   Final  Culture  Setup Time 10/19/2012 20:24   Final   Colony Count >=100,000 COLONIES/ML   Final   Culture KLEBSIELLA PNEUMONIAE   Final   Report Status 10/21/2012 FINAL   Final   Organism ID, Bacteria KLEBSIELLA PNEUMONIAE   Final  CLOSTRIDIUM DIFFICILE BY PCR     Status: None   Collection Time    10/21/12  7:54 AM      Result Value Range Status   C difficile by pcr NEGATIVE  NEGATIVE Final     Studies: Dg Abd Acute W/chest  10/21/2012   *RADIOLOGY REPORT*  Clinical Data: Abdominal pain.  ACUTE ABDOMEN SERIES (ABDOMEN 2 VIEW & CHEST 1 VIEW)  Comparison: Radiographs dated 08/12/2012  Findings: PICC line tip is at the cavoatrial junction in good position. Almost complete clearing of the abnormality of the right middle lobe since the prior chest x-ray.  There is a jejunostomy tube in place.  There is contrast  throughout the nondistended colon with multiple visible diverticula.  Evidence of previous cholecystectomy and gastric surgery. No free air.  No dilated loops of large or small bowel.  No acute osseous abnormality.  IMPRESSION: Benign-appearing abdomen.  Diverticulosis.   Original Report Authenticated By: Francene Boyers, M.D.    Scheduled Meds: . dextrose      . enoxaparin (LOVENOX) injection  40 mg Subcutaneous Q24H  . free water  150 mL Per Tube TID  . insulin aspart  0-9 Units Subcutaneous TID WC  . levofloxacin (LEVAQUIN) IV  750 mg Intravenous Q24H  . metoCLOPramide  5 mg Per Tube TID  . pantoprazole  40 mg Oral Daily  . sucralfate  1 g Per Tube BID   Continuous Infusions: . feeding supplement (JEVITY 1.2 CAL)      Principal Problem:   Abdominal pain Active Problems:   DM, UNCOMPLICATED, TYPE II   HYPERTENSION   Nausea with vomiting   UTI (urinary tract infection)   Diarrhea   S/P total gastrectomy and Roux-en-Y esophagojejunal anastomosis 2012   Esophageal dysmotility with poor peristalsis    Time spent: 35 minutes.    Chaya Jan  [facility] Pager [phone]  If 7PM-7AM, please contact night-coverage at [email or web address], password [facility] 10/22/2012, 10:58 AM  LOS: 3 days

## 2012-10-22 NOTE — Progress Notes (Signed)
Cross cover [id number]  Subjective: Patient claims she feels a lot better this afternoon. Claims her nausea is much better.She has had a total gastrectomy with a Roux-en-Y esophagojejunal anastamosis at [facility].  TF's to be started tonight.  Objective: Vital signs in last 24 hours: Temp:  [98.6 F (37 C)-99.1 F (37.3 C)] 99.1 F (37.3 C) (06/21 1444) Pulse Rate:  [90-110] 110 (06/21 1444) Resp:  [16-20] 20 (06/21 1444) BP: (121-138)/(63-74) 121/69 mmHg (06/21 1444) SpO2:  [98 %-100 %] 100 % (06/21 1444) Last BM Date: 10/21/12  Intake/Output from previous day: 06/20 0701 - 06/21 0700 In: 210 [P.O.:60; IV Piggyback:150] Out: 750 [Urine:600; Drains:150] Intake/Output this shift: Total I/O In: 0  Out: 200 [Urine:200]  General appearance: cooperative, appears stated age, fatigued and no distress Resp: clear to auscultation bilaterally Cardio: regular rate and rhythm, S1, S2 normal, no murmur, click, rub or gallop GI: soft, non-tender; bowel sounds normal; no masses,  no organomegaly  Lab Results:  Recent Labs  10/19/12 1733 10/20/12 1110 10/22/12 0500  WBC 10.0 11.1* 6.3  HGB 11.8* 11.2* 9.0*  HCT 35.1* 33.3* 27.1*  PLT 384 393 343   BMET  Recent Labs  10/19/12 1733 10/20/12 0755 10/22/12 0500  NA 143 133* 138  K 4.2 5.7* 2.9*  CL 109 105 106  CO2 24 20 24   GLUCOSE 83 93 64*  BUN 12 7 3*  CREATININE 0.62 0.55 0.59  CALCIUM 9.1 8.3* 7.8*   LFT  Recent Labs  10/22/12 0500  PROT 5.6*  ALBUMIN 1.8*  AST 17  ALT 8  ALKPHOS 98  BILITOT 0.2*   Studies/Results: Dg Abd Acute W/chest  10/21/2012   *RADIOLOGY REPORT*  Clinical Data: Abdominal pain.  ACUTE ABDOMEN SERIES (ABDOMEN 2 VIEW & CHEST 1 VIEW)  Comparison: Radiographs dated 08/12/2012  Findings: PICC line tip is at the cavoatrial junction in good position. Almost complete clearing of the abnormality of the right middle lobe since the prior chest x-ray.  There is a jejunostomy tube in place.  There is  contrast throughout the nondistended colon with multiple visible diverticula.  Evidence of previous cholecystectomy and gastric surgery. No free air.  No dilated loops of large or small bowel.  No acute osseous abnormality.  IMPRESSION: Benign-appearing abdomen.  Diverticulosis.   Original Report Authenticated By: Francene Boyers, M.D.   Medications: I have reviewed the patient's current medications.  Assessment/Plan: 1) Chronic nausea s/p total gastrectomy with Roux-en--Y esophagojejunal anastamosis with PEG/IBS-D/GERD/Diverticulosis. Improved; TF's to start tonight. 2) Anemia with a drop in her hemoglobin from 11.8-11.2-9 gm/dl. Will watch closely. 3) Esophageal dysmotility. 4) PCM-Albumin of 1.8  LOS: 3 days   Emily [name] 10/22/2012, 3:58 PM
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
## 2012-10-22 NOTE — Progress Notes (Signed)
ANTIBIOTIC CONSULT NOTE - FOLLOW UP  Pharmacy Consult for levaquin Indication: UTI  No Known Allergies  Patient Measurements: Height: 5' 4.17" (163 cm) Weight: 156 lb 1.4 oz (70.8 kg) IBW/kg (Calculated) : 55.1  Vital Signs: Temp: 98.8 F (37.1 C) (06/21 0518) BP: 128/63 mmHg (06/21 0518) Pulse Rate: 90 (06/21 0518) Intake/Output from previous day: 06/20 0701 - 06/21 0700 In: 210 [P.O.:60; IV Piggyback:150] Out: 750 [Urine:600; Drains:150] Intake/Output from this shift:    Labs:  Recent Labs  10/19/12 1733 10/20/12 0755 10/20/12 1110 10/22/12 0500  WBC 10.0  --  11.1* 6.3  HGB 11.8*  --  11.2* 9.0*  PLT 384  --  393 343  CREATININE 0.62 0.55  --  0.59   Estimated Creatinine Clearance: 61.6 ml/min (by C-G formula based on Cr of 0.59). No results found for this basename: VANCOTROUGH, Leodis Binet, VANCORANDOM, GENTTROUGH, GENTPEAK, GENTRANDOM, TOBRATROUGH, TOBRAPEAK, TOBRARND, AMIKACINPEAK, AMIKACINTROU, AMIKACIN,  in the last 72 hours   Microbiology: Recent Results (from the past 720 hour(s))  URINE CULTURE     Status: None   Collection Time    10/19/12  6:06 PM      Result Value Range Status   Specimen Description URINE, CLEAN CATCH   Final   Special Requests NONE   Final   Culture  Setup Time 10/19/2012 20:24   Final   Colony Count >=100,000 COLONIES/ML   Final   Culture KLEBSIELLA PNEUMONIAE   Final   Report Status 10/21/2012 FINAL   Final   Organism ID, Bacteria KLEBSIELLA PNEUMONIAE   Final  CLOSTRIDIUM DIFFICILE BY PCR     Status: None   Collection Time    10/21/12  7:54 AM      Result Value Range Status   C difficile by pcr NEGATIVE  NEGATIVE Final    Anti-infectives   Start     Dose/Rate Route Frequency Ordered Stop   10/20/12 0400  metroNIDAZOLE (FLAGYL) IVPB 500 mg  Status:  Discontinued     500 mg 100 mL/hr over 60 Minutes Intravenous Every 8 hours 10/20/12 0359 10/20/12 0436   10/20/12 0030  metroNIDAZOLE (FLAGYL) IVPB 500 mg  Status:   Discontinued     500 mg 100 mL/hr over 60 Minutes Intravenous Every 8 hours 10/20/12 0021 10/20/12 1518   10/19/12 2300  levofloxacin (LEVAQUIN) IVPB 750 mg     750 mg 100 mL/hr over 90 Minutes Intravenous Every 24 hours 10/19/12 2240     10/19/12 1945  cefTRIAXone (ROCEPHIN) 1 g in dextrose 5 % 50 mL IVPB     1 g 100 mL/hr over 30 Minutes Intravenous  Once 10/19/12 1941 10/19/12 2237      Assessment: 72 YOF on D#4 levaquin for klebsiella UTI. Wbc 11.1 >> 6.3, afebrile, scr 0.59, est. crcl ~ 48ml/min. Plan for treating for 7 days  Levaquin 6/18 >> Flagyl 6/19 >> 6/20 Rocephin 6/18 x 1  6/18 urine Cx >> GNR 6/19 C-Diff PCR >> klebsiella pneumoniae (R- amp, nitrofurantoin, zosyn, S-ancef, rocephin, levaquin, cipro, gentamicin, bactrim)  Goal of Therapy:  Appropriate levaquin dosing.  Plan:  - Continue levaquin 750mg  IV Q 24 hrs - f/u clinical improvement, and LOT  Bayard Hugger, PharmD, BCPS  Clinical Pharmacist  Pager: [phone]   10/22/2012,2:33 PM

## 2012-10-23 LAB — CBC
HCT: 26.9 % — ABNORMAL LOW (ref 36.0–46.0)
Hemoglobin: 8.7 g/dL — ABNORMAL LOW (ref 12.0–15.0)
MCH: 25.4 pg — ABNORMAL LOW (ref 26.0–34.0)
MCHC: 32.3 g/dL (ref 30.0–36.0)
MCV: 78.7 fL (ref 78.0–100.0)
Platelets: 312 10*3/uL (ref 150–400)
RBC: 3.42 MIL/uL — ABNORMAL LOW (ref 3.87–5.11)
RDW: 13.6 % (ref 11.5–15.5)
WBC: 6.3 10*3/uL (ref 4.0–10.5)

## 2012-10-23 LAB — BASIC METABOLIC PANEL
BUN: 6 mg/dL (ref 6–23)
CO2: 23 mEq/L (ref 19–32)
Calcium: 7.5 mg/dL — ABNORMAL LOW (ref 8.4–10.5)
Chloride: 108 mEq/L (ref 96–112)
Creatinine, Ser: 0.58 mg/dL (ref 0.50–1.10)
GFR calc Af Amer: >90 mL/min (ref >90)
GFR calc non Af Amer: 90 mL/min — ABNORMAL LOW (ref >90)
Glucose, Bld: 104 mg/dL — ABNORMAL HIGH (ref 70–99)
Potassium: 3.4 mEq/L — ABNORMAL LOW (ref 3.5–5.1)
Sodium: 137 mEq/L (ref 135–145)

## 2012-10-23 LAB — GLUCOSE, CAPILLARY
Glucose-Capillary: 100 mg/dL — ABNORMAL HIGH (ref 70–99)
Glucose-Capillary: 102 mg/dL — ABNORMAL HIGH (ref 70–99)
Glucose-Capillary: 87 mg/dL (ref 70–99)
Glucose-Capillary: 80 mg/dL (ref 70–99)

## 2012-10-23 MED ORDER — LOPERAMIDE HCL 2 MG PO CAPS
2.0000 mg | ORAL_CAPSULE | ORAL | Status: DC | PRN
  Administered 2012-10-23 – 2012-10-24 (×3): 2 mg via ORAL
  Filled 2012-10-23 (×3): qty 1

## 2012-10-23 MED ORDER — LEVOFLOXACIN 750 MG PO TABS
750.0000 mg | ORAL_TABLET | Freq: Every day | ORAL | Status: DC
  Administered 2012-10-23: 750 mg via ORAL
  Filled 2012-10-23 (×2): qty 1

## 2012-10-23 NOTE — Progress Notes (Addendum)
Cross cover [facility]  Subjective: Since I last evaluated the patient, she feels significantly better. She denies having any abdominal pain or nausea. Regurtigated a small amount of her breakfast today. Had 2 loose BM's this morning.  Objective: Vital signs in last 24 hours: Temp:  [97.8 F (36.6 C)-99.1 F (37.3 C)] 98.4 F (36.9 C) (06/22 0625) Pulse Rate:  [85-110] 95 (06/22 0625) Resp:  [18-20] 18 (06/22 0625) BP: (120-126)/(59-69) 126/59 mmHg (06/22 0625) SpO2:  [100 %] 100 % (06/22 0625) Last BM Date: 10/23/12  Intake/Output from previous day: 06/21 0701 - 06/22 0700 In: 2040 [P.O.:240; IV Piggyback:150] Out: 321 [Urine:321] Intake/Output this shift: Total I/O In: 120 [P.O.:120] Out: -   General appearance: alert Resp: clear to auscultation bilaterally Cardio: regular rate and rhythm, S1, S2 normal, no murmur, click, rub or gallop GI: soft, obese, non-tender; bowel sounds normal; no masses,  no organomegaly; PEG present  Lab Results:  Recent Labs  10/20/12 1110 10/22/12 0500 10/23/12 0610  WBC 11.1* 6.3 6.3  HGB 11.2* 9.0* 8.7*  HCT 33.3* 27.1* 26.9*  PLT 393 343 312   BMET  Recent Labs  10/22/12 0500 10/23/12 0610  NA 138 137  K 2.9* 3.4*  CL 106 108  CO2 24 23  GLUCOSE 64* 104*  BUN 3* 6  CREATININE 0.59 0.58  CALCIUM 7.8* 7.5*   LFT  Recent Labs  10/22/12 0500  PROT 5.6*  ALBUMIN 1.8*  AST 17  ALT 8  ALKPHOS 98  BILITOT 0.2*   Studies/Results: Dg Abd Acute W/chest  10/21/2012   *RADIOLOGY REPORT*  Clinical Data: Abdominal pain.  ACUTE ABDOMEN SERIES (ABDOMEN 2 VIEW & CHEST 1 VIEW)  Comparison: Radiographs dated 08/12/2012  Findings: PICC line tip is at the cavoatrial junction in good position. Almost complete clearing of the abnormality of the right middle lobe since the prior chest x-ray.  There is a jejunostomy tube in place.  There is contrast throughout the nondistended colon with multiple visible diverticula.  Evidence of previous  cholecystectomy and gastric surgery. No free air.  No dilated loops of large or small bowel.  No acute osseous abnormality.  IMPRESSION: Benign-appearing abdomen.  Diverticulosis.   Original Report Authenticated By: Francene Boyers, M.D.   Medications: I have reviewed the patient's current medications.  Assessment/Plan: 1) Gastroparesis/s/p total gastrectomy: Patient is on TF's at night. Improving slowly. 2) Anemia of chronic disease: Follow CBC's closely. 3) Diverticulosis.  4) Esophageal dysmotility.    LOS: 4 days   Jethro Radke 10/23/2012, 9:39 AM

## 2012-10-23 NOTE — Progress Notes (Signed)
[facility] PROGRESS NOTE  Emily [name] YNW:[id number] DOB: 1940/07/01 DOA: 10/19/2012 PCP: Dorrene German, MD  Assessment/Plan: SBO/N/V/Diarrhea -Acute on chronic. -Continue to improve. -Is tolerating her tube feeds as well as some POs, altho she continues to have nausea. -Also has some diarrhea. I have reassured her that is to be expected with tube feeds. Will give imodium to use PRN.  UTI -Cx with Klebsiella -Continue levaquin for 7 days total.  ?PNA -Doubt given absence of symptoms. -DC flagyl, continue levaquin for UTI.  DM -Well controlled. -Continue current management.  Code Status: Full code Family Communication: Patient Only Disposition Plan: Anticipate DC home in 24-48 hours.   Consultants:  GI  Surgery  Antibiotics:  Levaquin day 4  Subjective: Feels a little better today; no emesis this am or last night. Has had some episodes of diarrhea overnight and thi am as well.  Objective: Filed Vitals:   10/22/12 0518 10/22/12 1444 10/22/12 2121 10/23/12 0625  BP: 128/63 121/69 120/59 126/59  Pulse: 90 110 85 95  Temp: 98.8 F (37.1 C) 99.1 F (37.3 C) 97.8 F (36.6 C) 98.4 F (36.9 C)  TempSrc:  Oral    Resp: 16 20 18 18   Height:      Weight:      SpO2: 98% 100% 100% 100%    Intake/Output Summary (Last 24 hours) at 10/23/12 1315 Last data filed at 10/23/12 1200  Gross per 24 hour  Intake   2190 ml  Output    121 ml  Net   2069 ml   Filed Weights   10/19/12 2138  Weight: 70.8 kg (156 lb 1.4 oz)    Exam:   General:  AA Ox3  Cardiovascular: RRR  Respiratory: CTA B  Abdomen: S/NT/ND/+BS/no masses or organomegaly noted.  Extremities: no C/C/E/+pedal pulses   Neurologic:  Grossly intact and non-focal.  Data Reviewed: Basic Metabolic Panel:  Recent Labs Lab 10/19/12 1733 10/20/12 0755 10/22/12 0500 10/23/12 0610  NA 143 133* 138 137  K 4.2 5.7* 2.9* 3.4*  CL 109 105 106 108  CO2 24 20 24 23   GLUCOSE 83 93 64* 104*   BUN 12 7 3* 6  CREATININE 0.62 0.55 0.59 0.58  CALCIUM 9.1 8.3* 7.8* 7.5*   Liver Function Tests:  Recent Labs Lab 10/19/12 1733 10/22/12 0500  AST 23 17  ALT 10 8  ALKPHOS 126* 98  BILITOT 0.4 0.2*  PROT 7.6 5.6*  ALBUMIN 2.6* 1.8*    Recent Labs Lab 10/19/12 1733  LIPASE 35   No results found for this basename: AMMONIA,  in the last 168 hours CBC:  Recent Labs Lab 10/19/12 1733 10/20/12 1110 10/22/12 0500 10/23/12 0610  WBC 10.0 11.1* 6.3 6.3  NEUTROABS 6.9  --   --   --   HGB 11.8* 11.2* 9.0* 8.7*  HCT 35.1* 33.3* 27.1* 26.9*  MCV 77.5* 78.0 78.1 78.7  PLT 384 393 343 312   Cardiac Enzymes: No results found for this basename: CKTOTAL, CKMB, CKMBINDEX, TROPONINI,  in the last 168 hours BNP (last 3 results) No results found for this basename: PROBNP,  in the last 8760 hours CBG:  Recent Labs Lab 10/22/12 1703 10/22/12 1841 10/22/12 2123 10/23/12 0632 10/23/12 1139  GLUCAP 62* 116* 87 100* 102*    Recent Results (from the past 240 hour(s))  URINE CULTURE     Status: None   Collection Time    10/19/12  6:06 PM      Result Value Range  Status   Specimen Description URINE, CLEAN CATCH   Final   Special Requests NONE   Final   Culture  Setup Time 10/19/2012 20:24   Final   Colony Count >=100,000 COLONIES/ML   Final   Culture KLEBSIELLA PNEUMONIAE   Final   Report Status 10/21/2012 FINAL   Final   Organism ID, Bacteria KLEBSIELLA PNEUMONIAE   Final  CLOSTRIDIUM DIFFICILE BY PCR     Status: None   Collection Time    10/21/12  7:54 AM      Result Value Range Status   C difficile by pcr NEGATIVE  NEGATIVE Final     Studies: Dg Abd Acute W/chest  10/21/2012   *RADIOLOGY REPORT*  Clinical Data: Abdominal pain.  ACUTE ABDOMEN SERIES (ABDOMEN 2 VIEW & CHEST 1 VIEW)  Comparison: Radiographs dated 08/12/2012  Findings: PICC line tip is at the cavoatrial junction in good position. Almost complete clearing of the abnormality of the right middle lobe since  the prior chest x-ray.  There is a jejunostomy tube in place.  There is contrast throughout the nondistended colon with multiple visible diverticula.  Evidence of previous cholecystectomy and gastric surgery. No free air.  No dilated loops of large or small bowel.  No acute osseous abnormality.  IMPRESSION: Benign-appearing abdomen.  Diverticulosis.   Original Report Authenticated By: Francene Boyers, M.D.    Scheduled Meds: . enoxaparin (LOVENOX) injection  40 mg Subcutaneous Q24H  . free water  150 mL Per Tube TID  . insulin aspart  0-9 Units Subcutaneous TID WC  . levofloxacin (LEVAQUIN) IV  750 mg Intravenous Q24H  . metoCLOPramide  5 mg Per Tube TID  . pantoprazole  40 mg Oral Daily  . sucralfate  1 g Per Tube BID   Continuous Infusions: . feeding supplement (OSMOLITE 1.2 CAL) 1,000 mL (10/23/12 0550)    Principal Problem:   Abdominal pain Active Problems:   DM, UNCOMPLICATED, TYPE II   HYPERTENSION   Nausea with vomiting   UTI (urinary tract infection)   Diarrhea   S/P total gastrectomy and Roux-en-Y esophagojejunal anastomosis 2012   Esophageal dysmotility with poor peristalsis    Time spent: 35 minutes.    Chaya Jan  [facility] Pager [phone]  If 7PM-7AM, please contact night-coverage at [email or web address], password [facility] 10/23/2012, 1:14 PM  LOS: 4 days

## 2012-10-24 LAB — BASIC METABOLIC PANEL
BUN: 7 mg/dL (ref 6–23)
CO2: 23 mEq/L (ref 19–32)
Calcium: 7.5 mg/dL — ABNORMAL LOW (ref 8.4–10.5)
Chloride: 110 mEq/L (ref 96–112)
Creatinine, Ser: 0.69 mg/dL (ref 0.50–1.10)
GFR calc Af Amer: >90 mL/min (ref >90)
GFR calc non Af Amer: 85 mL/min — ABNORMAL LOW (ref >90)
Glucose, Bld: 121 mg/dL — ABNORMAL HIGH (ref 70–99)
Potassium: 3.4 mEq/L — ABNORMAL LOW (ref 3.5–5.1)
Sodium: 139 mEq/L (ref 135–145)

## 2012-10-24 LAB — GLUCOSE, CAPILLARY
Glucose-Capillary: 112 mg/dL — ABNORMAL HIGH (ref 70–99)
Glucose-Capillary: 140 mg/dL — ABNORMAL HIGH (ref 70–99)
Glucose-Capillary: 150 mg/dL — ABNORMAL HIGH (ref 70–99)
Glucose-Capillary: 61 mg/dL — ABNORMAL LOW (ref 70–99)

## 2012-10-24 MED ORDER — LEVOFLOXACIN 750 MG PO TABS: 750.0000 mg | ORAL_TABLET | Freq: Every day | ORAL | Status: DC

## 2012-10-24 MED ORDER — ACETAMINOPHEN 325 MG PO TABS: 650.0000 mg | ORAL_TABLET | Freq: Four times a day (QID) | ORAL | Status: DC | PRN

## 2012-10-24 NOTE — Progress Notes (Signed)
PICC Line removed as ordered. Line intact at 38cm. Manual pressure held with no active bleeding noted. Vaseline gauze pressure dressing applied and secured. Patient teaching done.

## 2012-10-24 NOTE — Progress Notes (Signed)
Subjective: Pt c/o nausea since last night.  She says she was started on regular food because she doesn't like anything on the liquid tray and it all makes her sick.  She has continued to have intermittent nausea with 2 episodes of vomiting yesterday.  Patient was able to eat about 60% of her breakfast, but started having nausea about 10 minutes into eating.  She also c/o upper abdominal pain.  She also c/o "indigestion".  Pt continues to have diarrhea and is urinating normally.    Objective: Vital signs in last 24 hours: Temp:  [98.5 F (36.9 C)-98.6 F (37 C)] 98.5 F (36.9 C) (06/23 0554) Pulse Rate:  [90-105] 103 (06/23 0554) Resp:  [16-20] 16 (06/23 0554) BP: (114-129)/(54-62) 118/54 mmHg (06/23 0554) SpO2:  [98 %-100 %] 99 % (06/23 0554) Last BM Date: 10/23/12  Intake/Output from previous day: 06/22 0701 - 06/23 0700 In: 1540 [P.O.:1140; I.V.:10] Out: -  Intake/Output this shift:    PE: Gen:  Alert, NAD, pleasant Abd: Soft, mildly tender in the upper abdomen, ND, +BS   Lab Results:   Recent Labs  10/22/12 0500 10/23/12 0610  WBC 6.3 6.3  HGB 9.0* 8.7*  HCT 27.1* 26.9*  PLT 343 312   BMET  Recent Labs  10/23/12 0610 10/24/12 0530  NA 137 139  K 3.4* 3.4*  CL 108 110  CO2 23 23  GLUCOSE 104* 121*  BUN 6 7  CREATININE 0.58 0.69  CALCIUM 7.5* 7.5*   PT/INR No results found for this basename: LABPROT, INR,  in the last 72 hours CMP     Component Value Date/Time   NA 139 10/24/2012 0530   K 3.4* 10/24/2012 0530   CL 110 10/24/2012 0530   CO2 23 10/24/2012 0530   GLUCOSE 121* 10/24/2012 0530   BUN 7 10/24/2012 0530   CREATININE 0.69 10/24/2012 0530   CALCIUM 7.5* 10/24/2012 0530   PROT 5.6* 10/22/2012 0500   ALBUMIN 1.8* 10/22/2012 0500   AST 17 10/22/2012 0500   ALT 8 10/22/2012 0500   ALKPHOS 98 10/22/2012 0500   BILITOT 0.2* 10/22/2012 0500   GFRNONAA 85* 10/24/2012 0530   GFRAA >90 10/24/2012 0530   Lipase     Component Value Date/Time   LIPASE 35  10/19/2012 1733       Studies/Results: No results found.  Anti-infectives: Anti-infectives   Start     Dose/Rate Route Frequency Ordered Stop   10/23/12 2000  levofloxacin (LEVAQUIN) tablet 750 mg     750 mg Oral Daily 10/23/12 1320     10/20/12 0400  metroNIDAZOLE (FLAGYL) IVPB 500 mg  Status:  Discontinued     500 mg 100 mL/hr over 60 Minutes Intravenous Every 8 hours 10/20/12 0359 10/20/12 0436   10/20/12 0030  metroNIDAZOLE (FLAGYL) IVPB 500 mg  Status:  Discontinued     500 mg 100 mL/hr over 60 Minutes Intravenous Every 8 hours 10/20/12 0021 10/20/12 1518   10/19/12 2300  levofloxacin (LEVAQUIN) IVPB 750 mg  Status:  Discontinued     750 mg 100 mL/hr over 90 Minutes Intravenous Every 24 hours 10/19/12 2240 10/23/12 1319   10/19/12 1945  cefTRIAXone (ROCEPHIN) 1 g in dextrose 5 % 50 mL IVPB     1 g 100 mL/hr over 30 Minutes Intravenous  Once 10/19/12 1941 10/19/12 2237       Assessment/Plan Acute on chronic Abdominal pain/nausea/vomiting S/p J tube and total gastrectomy, esophagojejunostomy with Roux-en-Y reconstruction, possible pSBO from J tube  balloon (adjusted by Dr. Michaell Cowing) 1.  Pt having more nausea/vomiting since diet was advanced to a regular diet.   2.  Appreciate dietitians input 3.  Given her episodes of nausea/vomiting would recommend we back off her diet and/or TF, but will leave this up to GI.  Given the fact that she doesn't like anything on the liquid tray she could try the frozen popsicle or maybe ensure supplements instead of a tray.  Also if we advance her diet in the future she should be on a post gastrectomy diet instead of a "regular diet". 4.  Ambulate and IS 5.  SCD's and lovenox 6.  No surgical intervention planned    LOS: 5 days    DORT, [facility] 10/24/2012, 8:13 AM Pager: [phone]

## 2012-10-24 NOTE — Discharge Summary (Signed)
Physician Discharge Summary  Emily [name] WUJ:[id number] DOB: [birth year] DOA: 10/19/2012  PCP: Dorrene German, MD  Admit date: 10/19/2012 Discharge date: 10/24/2012  Time spent: Greater than 30 minutes  Recommendations for Outpatient Follow-up:  -Advised to follow up with her PCP in 1 week.   Discharge Diagnoses:  Principal Problem:   Abdominal pain Active Problems:   DM, UNCOMPLICATED, TYPE II   HYPERTENSION   Nausea with vomiting   UTI (urinary tract infection)   Diarrhea   S/P total gastrectomy and Roux-en-Y esophagojejunal anastomosis 2012   Esophageal dysmotility with poor peristalsis   Discharge Condition: Stable and improved  Filed Weights   10/19/12 2138  Weight: 70.8 kg (156 lb 1.4 oz)    History of present illness:  Patient is a 72 y.o. female with known history of gastrectomy and presently having jejunostomy tube for feeding and recently had tube replaced and patient has started eating orally in addition for last 2 weeks started experiencing abdominal pain with nausea vomiting and diarrhea for last 3 days. Patient has been having nausea vomiting and diarrhea for last 3 days. The diarrhea happens on a once a day with nausea vomiting 3-4 times a day. Since yesterday patient started having epigastric pain which was dull and constant and patient came to the ER. Denies any fever chills chest pain shortness of breath or any productive cough. CT abdomen pelvis in the ER shows possible partial small bowel obstruction. Patient has been on med for further management. 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 We were asked to admit her for further evaluation and management.   Hospital Course:   Diabetic Gastroparesis/N/V/Diarrhea  -Acute on chronic.  -Continues to improve and is eating over 50% of her meals in addition to her nightly tube feeds; however, she continues to have some occasional nausea that is chronic in nature for her. -Also has some diarrhea. I have  reassured her that is to be expected with tube feeds. Will give imodium to use PRN.   UTI  -Cx with Klebsiella  -Continue levaquin for 7 days total. Has 2 more days at time of DC.  ?PNA  -Doubt given absence of symptoms.  -DC flagyl, continue levaquin for UTI.   DM  -Well controlled.  -Continue current management.   Procedures:  None   Consultations:  Dr. Michaell Cowing, Surgery  Dr. Loreta Ave, GI  Discharge Instructions  Discharge Orders   Future Appointments Provider Department Dept Phone   11/01/2012 4:15 PM Rollene Rotunda, MD [place] [facility] Main [facility] [place]) [phone]   Future Orders Complete By Expires     Discontinue IV  As directed     Increase activity slowly  As directed         Medication List    TAKE these medications       acetaminophen 325 MG tablet  Commonly known as:  TYLENOL  Take 2 tablets (650 mg total) by mouth every 6 (six) hours as needed.     diphenoxylate-atropine 2.5-0.025 MG per tablet  Commonly known as:  LOMOTIL  Take 1 tablet by mouth 2 (two) times daily as needed for diarrhea or loose stools.     feeding supplement (OSMOLITE 1.2 CAL) Liqd  Place 1,000 mLs into feeding tube continuous.     free water Soln  Place 150 mLs into feeding tube 3 (three) times daily.     levofloxacin 750 MG tablet  Commonly known as:  LEVAQUIN  Take 1 tablet (750 mg total) by mouth daily at 8  pm. For 2 more days     LORazepam 0.5 MG tablet  Commonly known as:  ATIVAN  Take 1 tablet by mouth three times daily as needed.     metoCLOPramide 5 MG tablet  Commonly known as:  REGLAN  Place 1 tablet (5 mg total) into feeding tube 3 (three) times daily. Take 1 tab 3 times daily before meals.     omeprazole 20 MG capsule  Commonly known as:  PRILOSEC  Take 1 capsule (20 mg total) by mouth 2 (two) times daily.     ondansetron 4 MG disintegrating tablet  Commonly known as:  ZOFRAN-ODT  Take 4 mg by mouth every 8 (eight) hours as needed. Nausea      promethazine 25 MG tablet  Commonly known as:  PHENERGAN  Take 1 tab every 6 hours as needed for nausea.     sucralfate 1 GM/10ML suspension  Commonly known as:  CARAFATE  Place 10 mLs (1 g total) into feeding tube 2 (two) times daily. 10 CC po BID       No Known Allergies     Follow-up Information   Follow up with AVBUERE,EDWIN A, MD. Schedule an appointment as soon as possible for a visit in 1 week.   Contact information:   [address] [place] [address] [phone]        The results of significant diagnostics from this hospitalization (including imaging, microbiology, ancillary and laboratory) are listed below for reference.    Significant Diagnostic Studies: Ct Abdomen Pelvis W Contrast  10/19/2012   *RADIOLOGY REPORT*  Clinical Data: Abdominal pain.  CT ABDOMEN AND PELVIS WITH CONTRAST  Technique:  Multidetector CT imaging of the abdomen and pelvis was performed following the standard protocol during bolus administration of intravenous contrast.  Contrast: OMNIPAQUE IOHEXOL 300 MG/ML  SOLN  Comparison: CT abdomen pelvis with contrast 07/02/2012.  CT chest 08/15/2012  Findings: Small opacity peripherally in the inferior aspect of the right middle lobe likely corresponds to the previously described right middle lobe pneumonia on chest CT of 08/15/2012.  Although the entire right middle lobe is not imaged today, this have the appearance of the imaged portion appears to demonstrate improved aeration.  Small opacity seen in the left lower lobe on image #1. This region of the left lower lobe was previously clear.  Mild, new focal opacities in the right lower lobe.  The distal esophagus is patulous/distended, and filled with oral contrast.  Where imaged, the distal esophageal measures 4.7 cm transverse diameter by 2.7 cm AP diameter.  Postsurgical changes of gastrectomy with an anastomosis of the distal esophagus with  jejunum.  This proximal jejunal loop at the level of  the anastomosis is opacified with oral contrast and distended up to 5.2 cm on image number 21.  Distal to this, small bowel loops are normal in caliber.  A jejunostomy tube in the left abdomen appears stable in position compared to the CT of the abdomen on March 2014.  There is extensive colonic diverticulosis.  No acute inflammatory changes in association with the diverticula.  The appendix is visualized.  The distal tip of the appendix measures up to 8 mm in diameter.  This appears similar to the CT of March 2014.  Gas is seen within the lumen of the appendix in its mid aspect.  There are no associated periappendiceal inflammatory changes.  No definite evidence of acute appendicitis.  Liver is within normal limits for size.  No focal hepatic  lesion. Patient is status post cholecystectomy.  Prominence of the common bile duct is stable.  Common bile duct tapers distally, and was recently evaluated with MRCP (08/15/2012).  No filling defects are seen within the dilated common bile duct (measures up to approximately 11 mm).  The pancreas, spleen, adrenal glands, and left kidney within normal limits.  Stable 10 mm cyst upper pole right kidney.  Is stable too small to characterize low density lesions lower pole right kidney. Negative for hydronephrosis or perinephric stranding.  The uterus and adnexa are within normal limits.  Urinary bladder not very distended and unremarkable.  Negative for lymphadenopathy, ascites, or free air.  Abdominal aorta normal in caliber and demonstrates atherosclerotic changes.  No acute osseous abnormality.  IMPRESSION:  1. Status post gastrectomy.  Just beyond the anastomosis of the distal esophagus and jejunum, proximal jejunal loop is dilated to 5 cm.  It is difficult to exclude partial obstruction of the proximal jejunum. A follow-up upper GI examination may be useful. 2.  Dilated patulous distal esophagus appears similar to prior studies.  Distal esophagus is distended with oral  contrast. Question if there is reflux of contrast from the proximal jejunum and/or esophageal dysmotility. 3.  Patchy airspace opacities at the lung bases.  Overall, airspace disease in the right middle lobe appears improved.  There are small a few small new opacities in the lower lobes bilaterally.  Question if the patient could be aspirating. 4.  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  This was recently evaluated with MRCP.  No filling defects are seen within the common bile duct. 5.  Stable position of jejunostomy tube. 6.  The distal appendix measured at measures up to 8 mm, mildly prominent.  This appears similar to a prior abdomen pelvis CT, and there are no periappendiceal inflammatory changes. If there is any clinical concern for acute appendicitis, short-term follow-up imaging could be considered. 7.  Colonic diverticulosis, without acute inflammatory changes.   Original Report Authenticated By: Britta Mccreedy, M.D.   Ir Replc Duoden/jejuno Tube Percut W/fluoro  10/10/2012   *RADIOLOGY REPORT*  Indication: Hole within the jejunostomy tube.  FLUROSCOPIC GUIDED REPLACEMENT OF JEJUNOSTOMY TUBE  Comparison: Fluoroscopic guided jejunostomy tube exchange - 06/15/2012; 02/15/2012  Medications: None  Contrast: 10 ml Omnipaque-300, administered enterically  Fluoroscopy Time: 2 minutes.  Complications: None immediate  Technique / Findings:  Informed written consent was obtained from the patient after a discussion of the risks, benefits and alternatives to treatment. Questions regarding the procedure were encouraged and answered.  A timeout was performed prior to the initiation of the procedure.  The external portion of the existing jejunostomy tube was prepped and draped in the usual sterile fashion, and a sterile drape was applied covering the operative field.  Maximum barrier sterile technique with sterile gowns and gloves were used for the procedure.  A timeout was performed prior to the initiation of the  procedure.  A small bowel contrast was injected via the existing jejunostomy tube demonstrates appropriate positioning within the mid small bowel.  Existing jejunostomy tube was cut and cannulated with a stiff Glidewire which was coiled within the jejunum.  Approximately 15 cm of the distal end of the jejunostomy catheter was cut.   Under intermittent fluoroscopic guidance, the existing jejunostomy catheter was exchanged for a new 20 French balloon retention jejunostomy tube.  The retention balloon was inflated and the balloon was cinched.  Contrast was injected and several spot fluoroscopic images were obtained in various  obliquities confirming intraluminal postioning.  The patient tolerated the procedure well without immediate postprocedural complication.  Impression:  Successful fluoroscopic guided placement of a new 20-French jejunostomy tube.  The new jejunostomy tube is ready for immediate use.   Original Report Authenticated By: Tacey Ruiz, MD   Dg Abd Acute W/chest  10/21/2012   *RADIOLOGY REPORT*  Clinical Data: Abdominal pain.  ACUTE ABDOMEN SERIES (ABDOMEN 2 VIEW & CHEST 1 VIEW)  Comparison: Radiographs dated 08/12/2012  Findings: PICC line tip is at the cavoatrial junction in good position. Almost complete clearing of the abnormality of the right middle lobe since the prior chest x-ray.  There is a jejunostomy tube in place.  There is contrast throughout the nondistended colon with multiple visible diverticula.  Evidence of previous cholecystectomy and gastric surgery. No free air.  No dilated loops of large or small bowel.  No acute osseous abnormality.  IMPRESSION: Benign-appearing abdomen.  Diverticulosis.   Original Report Authenticated By: Francene Boyers, M.D.    Microbiology: Recent Results (from the past 240 hour(s))  URINE CULTURE     Status: None   Collection Time    10/19/12  6:06 PM      Result Value Range Status   Specimen Description URINE, CLEAN CATCH   Final   Special  Requests NONE   Final   Culture  Setup Time 10/19/2012 20:24   Final   Colony Count >=100,000 COLONIES/ML   Final   Culture KLEBSIELLA PNEUMONIAE   Final   Report Status 10/21/2012 FINAL   Final   Organism ID, Bacteria KLEBSIELLA PNEUMONIAE   Final  CLOSTRIDIUM DIFFICILE BY PCR     Status: None   Collection Time    10/21/12  7:54 AM      Result Value Range Status   C difficile by pcr NEGATIVE  NEGATIVE Final     Labs: Basic Metabolic Panel:  Recent Labs Lab 10/19/12 1733 10/20/12 0755 10/22/12 0500 10/23/12 0610 10/24/12 0530  NA 143 133* 138 137 139  K 4.2 5.7* 2.9* 3.4* 3.4*  CL 109 105 106 108 110  CO2 24 20 24 23 23   GLUCOSE 83 93 64* 104* 121*  BUN 12 7 3* 6 7  CREATININE 0.62 0.55 0.59 0.58 0.69  CALCIUM 9.1 8.3* 7.8* 7.5* 7.5*   Liver Function Tests:  Recent Labs Lab 10/19/12 1733 10/22/12 0500  AST 23 17  ALT 10 8  ALKPHOS 126* 98  BILITOT 0.4 0.2*  PROT 7.6 5.6*  ALBUMIN 2.6* 1.8*    Recent Labs Lab 10/19/12 1733  LIPASE 35   No results found for this basename: AMMONIA,  in the last 168 hours CBC:  Recent Labs Lab 10/19/12 1733 10/20/12 1110 10/22/12 0500 10/23/12 0610  WBC 10.0 11.1* 6.3 6.3  NEUTROABS 6.9  --   --   --   HGB 11.8* 11.2* 9.0* 8.7*  HCT 35.1* 33.3* 27.1* 26.9*  MCV 77.5* 78.0 78.1 78.7  PLT 384 393 343 312   Cardiac Enzymes: No results found for this basename: CKTOTAL, CKMB, CKMBINDEX, TROPONINI,  in the last 168 hours BNP: BNP (last 3 results) No results found for this basename: PROBNP,  in the last 8760 hours CBG:  Recent Labs Lab 10/23/12 1958 10/24/12 0009 10/24/12 0343 10/24/12 0749 10/24/12 1158  GLUCAP 80 150* 61* 140* 112*       Signed:  HERNANDEZ ACOSTA,ESTELA  [facility] Pager: [phone] 10/24/2012, 3:22 PM

## 2012-10-24 NOTE — Progress Notes (Signed)
Patient having more vomiting today, but does not appear to be surgical.  Not sure what more can be done.  Maybe get more conservative with diet.  CPM.  Not sure what more we have to be add.  Marta Lamas. Gae Bon, MD, FACS [phone] [facility]

## 2012-10-24 NOTE — Care Management Note (Signed)
    Page 1 of 1   10/24/2012     11:43:32 AM   CARE MANAGEMENT NOTE 10/24/2012  Patient:  Emily [name], Emily [name]   Account Number:  [id number]  Date Initiated:  10/24/2012  Documentation initiated by:  Jacquelynn Cree  Subjective/Objective Assessment:   admitted with nausea, vomiting,diarrhea     Action/Plan:   plan return home with her sister  patient gets tube feeding supplies from [facility]   Anticipated DC Date:  10/24/2012   Anticipated DC Plan:  HOME/SELF CARE      DC Planning Services  CM consult      Choice offered to / List presented to:             Status of service:  Completed, signed off Medicare Important Message given?   (If response is "NO", the following Medicare IM given date fields will be blank) Date Medicare IM given:   Date Additional Medicare IM given:    Discharge Disposition:  HOME/SELF CARE  Per UR Regulation:  Reviewed for med. necessity/level of care/duration of stay  If discussed at Long Length of Stay Meetings, dates discussed:    Comments:  10/24/12 Patient lives with her sister, also has supportive daughter.Gets tube feeding supplies from [facility]. No HHC needs identified. Jacquelynn Cree RN, BSN, CCM

## 2012-11-01 ENCOUNTER — Ambulatory Visit (INDEPENDENT_AMBULATORY_CARE_PROVIDER_SITE_OTHER): Payer: Medicare Other | Admitting: Cardiology

## 2012-11-01 ENCOUNTER — Encounter: Payer: Self-pay | Admitting: Cardiology

## 2012-11-01 VITALS — BP 120/89 | HR 97 | Ht 64.0 in | Wt 147.8 lb

## 2012-11-01 DIAGNOSIS — I10 Essential (primary) hypertension: Secondary | ICD-10-CM
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
## 2012-11-01 NOTE — Patient Instructions (Addendum)
The current medical regimen is effective;  continue present plan and medications.  Please limit your fluid intake to 1,500 ml a day. Keep feet and legs elevated above the level of your heart. Please wear compression stockings daily. Follow a gram Sodium diet.  2 Gram Low Sodium Diet A 2 gram sodium diet restricts the amount of sodium in the diet to no more than 2 g or 2000 mg daily. Limiting the amount of sodium is often used to help lower blood pressure. It is important if you have heart, liver, or kidney problems. Many foods contain sodium for flavor and sometimes as a preservative. When the amount of sodium in a diet needs to be low, it is important to know what to look for when choosing foods and drinks. The following includes some information and guidelines to help make it easier for you to adapt to a low sodium diet. QUICK TIPS  Do not add salt to food.  Avoid convenience items and fast food.  Choose unsalted snack foods.  Buy lower sodium products, often labeled as "lower sodium" or "no salt added."  Check food labels to learn how much sodium is in 1 serving.  When eating at a restaurant, ask that your food be prepared with less salt or none, if possible. READING FOOD LABELS FOR SODIUM INFORMATION The nutrition facts label is a good place to find how much sodium is in foods. Look for products with no more than 500 to 600 mg of sodium per meal and no more than 150 mg per serving. Remember that 2 g = 2000 mg. The food label may also list foods as:  Sodium-free: Less than 5 mg in a serving.  Very low sodium: 35 mg or less in a serving.  Low-sodium: 140 mg or less in a serving.  Light in sodium: 50% less sodium in a serving. For example, if a food that usually has 300 mg of sodium is changed to become light in sodium, it will have 150 mg of sodium.  Reduced sodium: 25% less sodium in a serving. For example, if a food that usually has 400 mg of sodium is changed to reduced  sodium, it will have 300 mg of sodium. CHOOSING FOODS Grains  Avoid: Salted crackers and snack items. Some cereals, including instant hot cereals. Bread stuffing and biscuit mixes. Seasoned rice or pasta mixes.  Choose: Unsalted snack items. Low-sodium cereals, oats, puffed wheat and rice, shredded wheat. English muffins and bread. Pasta. Meats  Avoid: Salted, canned, smoked, spiced, pickled meats, including fish and poultry. Bacon, ham, sausage, cold cuts, hot dogs, anchovies.  Choose: Low-sodium canned tuna and salmon. Fresh or frozen meat, poultry, and fish. Dairy  Avoid: Processed cheese and spreads. Cottage cheese. Buttermilk and condensed milk. Regular cheese.  Choose: Milk. Low-sodium cottage cheese. Yogurt. Sour cream. Low-sodium cheese. Fruits and Vegetables  Avoid: Regular canned vegetables. Regular canned tomato sauce and paste. Frozen vegetables in sauces. Olives. Rosita Fire. Relishes. Sauerkraut.  Choose: Low-sodium canned vegetables. Low-sodium tomato sauce and paste. Frozen or fresh vegetables. Fresh and frozen fruit. Condiments  Avoid: Canned and packaged gravies. Worcestershire sauce. Tartar sauce. Barbecue sauce. Soy sauce. Steak sauce. Ketchup. Onion, garlic, and table salt. Meat flavorings and tenderizers.  Choose: Fresh and dried herbs and spices. Low-sodium varieties of mustard and ketchup. Lemon juice. Tabasco sauce. Horseradish. SAMPLE 2 GRAM SODIUM MEAL PLAN Breakfast / Sodium (mg)  1 cup low-fat milk / 143 mg  2 slices whole-wheat toast / 270 mg  1 tbs heart-healthy margarine / 153 mg  1 hard-boiled egg / 139 mg  1 small orange / 0 mg Lunch / Sodium (mg)  1 cup raw carrots / 76 mg   cup hummus / 298 mg  1 cup low-fat milk / 143 mg   cup red grapes / 2 mg  1 whole-wheat pita bread / 356 mg Dinner / Sodium (mg)  1 cup whole-wheat pasta / 2 mg  1 cup low-sodium tomato sauce / 73 mg  3 oz lean ground beef / 57 mg  1 small side salad (1  cup raw spinach leaves,  cup cucumber,  cup yellow bell pepper) with 1 tsp olive oil and 1 tsp red wine vinegar / 25 mg Snack / Sodium (mg)  1 container low-fat vanilla yogurt / 107 mg  3 graham cracker squares / 127 mg Nutrient Analysis  Calories: 2033  Protein: 77 g  Carbohydrate: 282 g  Fat: 72 g  Sodium: 1971 mg Document Released: 04/20/2005 Document Revised: 07/13/2011 Document Reviewed: 07/22/2009 [employer] Patient Information 2014 [place], [place].   Follow up as needed.

## 2012-11-08 IMAGING — CR DG ABDOMEN 2V
2 series · 2 of 2 positions shown · non-contrast
Comparison: CT abdomen pelvis of 04/09/2011

CLINICAL DATA: Abdominal pain, nausea and vomiting

ABDOMEN - 2 VIEW

[view not recorded (1 of 2)]
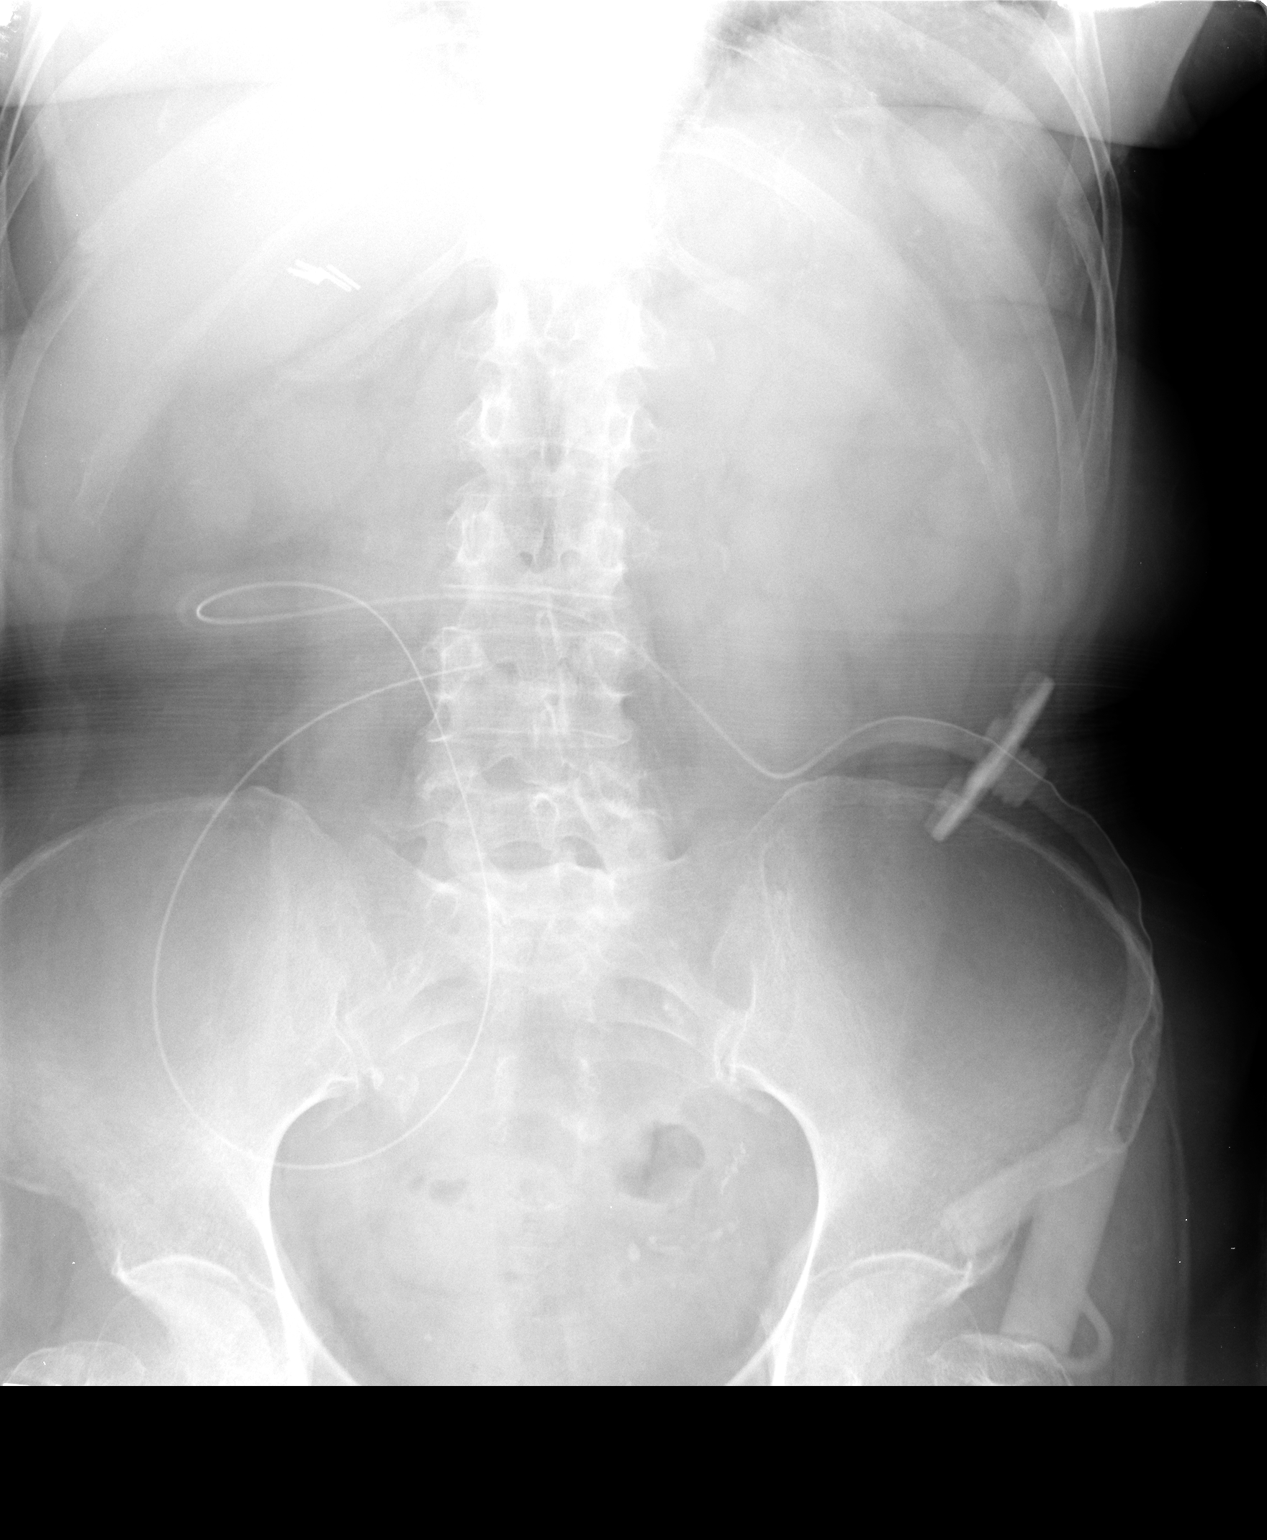

[view not recorded (2 of 2)]
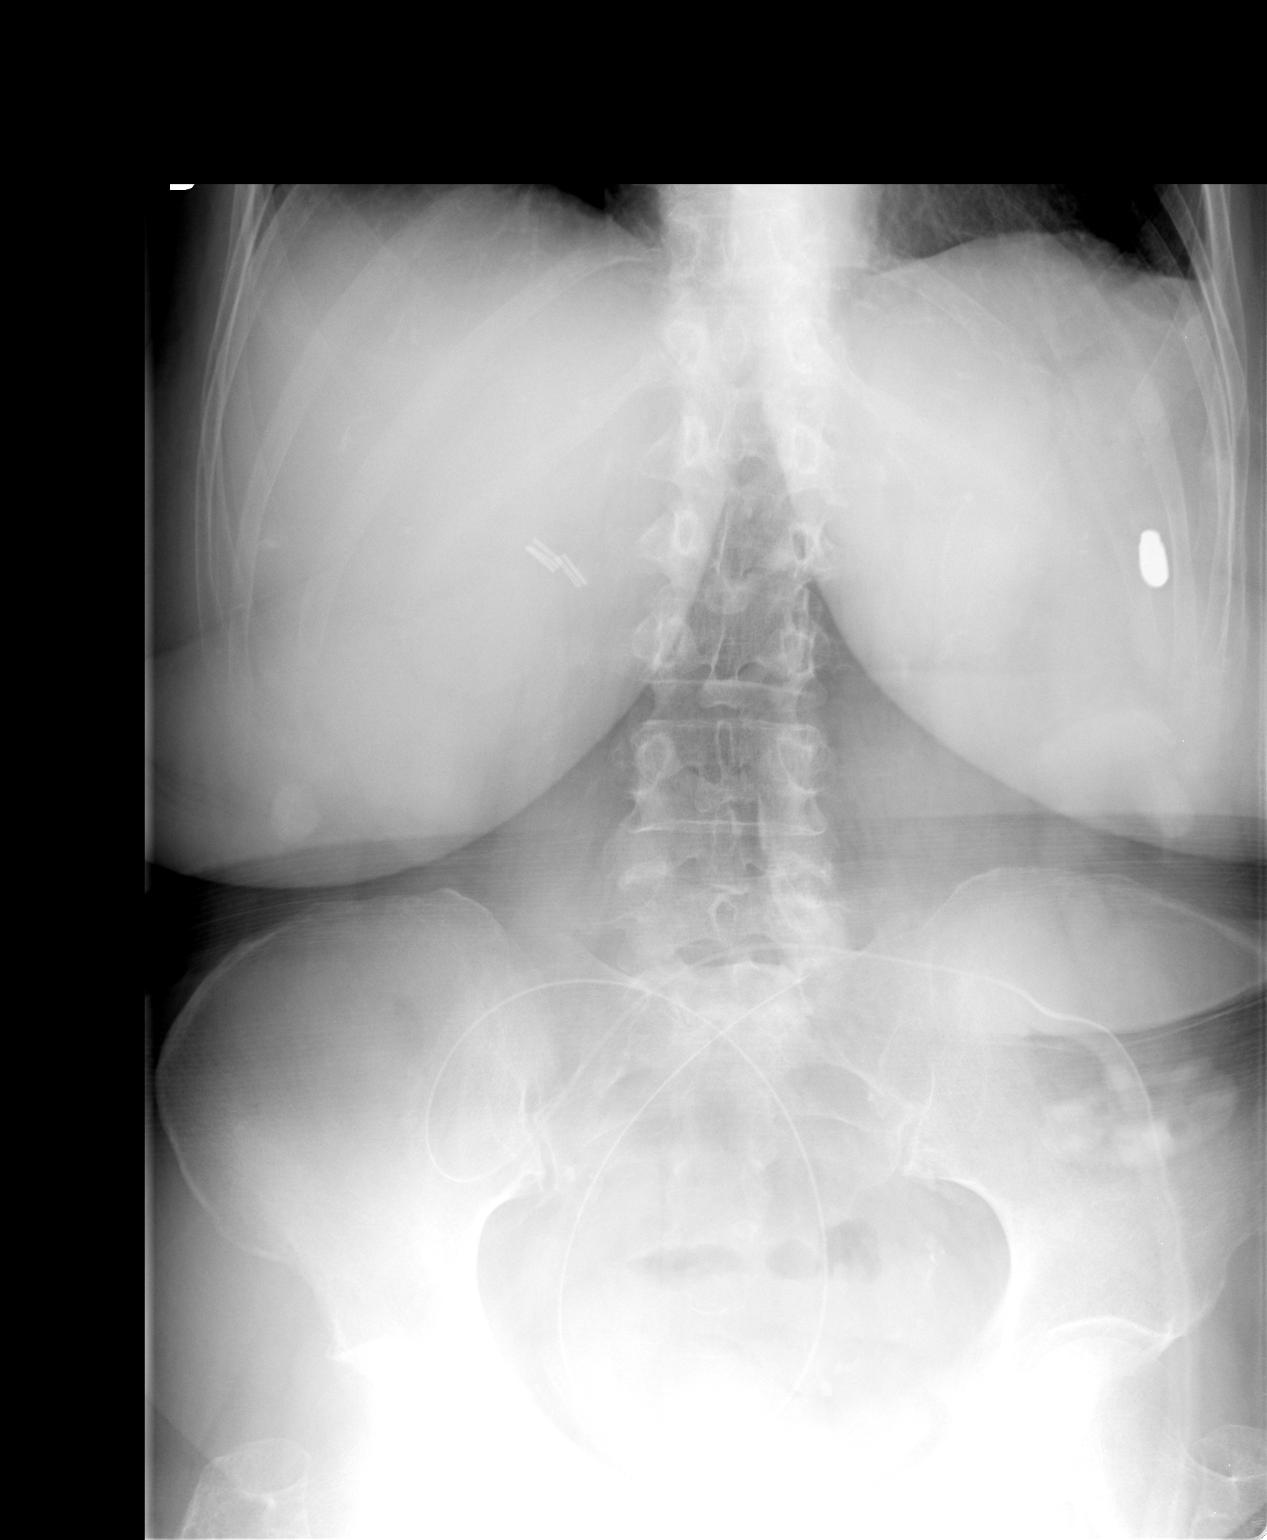

[2 of 2 positions shown; findings below may reference images not displayed]

FINDINGS: Supine and erect views of the abdomen show a paucity of
bowel gas throughout the abdomen.  No free air is seen on the erect
view.  A peritoneal dialysis catheter is noted coiling in the right
lower quadrant.  No bony abnormality is seen.  Surgical clips are
present in the right upper quadrant from prior cholecystectomy.
IMPRESSION: Paucity of bowel gas.  No evidence of bowel obstruction.  No free
air.

## 2012-11-10 ENCOUNTER — Other Ambulatory Visit: Payer: Self-pay | Admitting: Geriatric Medicine

## 2012-11-10 MED ORDER — DIPHENOXYLATE-ATROPINE 2.5-0.025 MG PO TABS: 1.0000 | ORAL_TABLET | Freq: Two times a day (BID) | ORAL | Status: DC | PRN

## 2012-11-14 ENCOUNTER — Telehealth: Payer: Self-pay | Admitting: Internal Medicine

## 2012-11-14 MED ORDER — METOCLOPRAMIDE HCL 5 MG PO TABS: 5.0000 mg | ORAL_TABLET | Freq: Three times a day (TID) | ORAL | Status: DC

## 2012-11-14 MED ORDER — PROMETHAZINE HCL 25 MG PO TABS: ORAL_TABLET | ORAL | Status: DC

## 2012-11-14 NOTE — Telephone Encounter (Signed)
Rx sent for reglan 5 mg tablets (as previously prescribed) and promethazine. Patient advised.

## 2012-11-14 NOTE — Telephone Encounter (Signed)
OK to refill all, Reglan 10 mg per J-tube ac ( tid), #100 1 refill

## 2012-11-14 NOTE — Telephone Encounter (Signed)
Dr Juanda Chance, patient requests refills on phenergan, reglan and Lomotil. Per pharmacy, patient already has a prescription of Lomotil given by another physician that is ready for pick up, so we wont need to send that. I know that she has previously been on Reglan and promethazine. However, it appears that when she was in the hospital last time, patient's prescription for liquid Reglan to take orally was changed to a tablet to be given via j-tube. Can I send the script as newly written or do I need to change it back to oral rx?

## 2012-11-16 ENCOUNTER — Telehealth: Payer: Self-pay | Admitting: *Deleted

## 2012-11-16 ENCOUNTER — Encounter: Payer: Self-pay | Admitting: *Deleted

## 2012-11-16 NOTE — Telephone Encounter (Signed)
Prior authorization for promethazine has been completed and the medication is approved per Pam . Approval # [id number]. Medication approved from 08/18/12-11/16/13.

## 2012-11-21 ENCOUNTER — Other Ambulatory Visit: Payer: Self-pay | Admitting: Physician Assistant

## 2012-11-21 NOTE — Telephone Encounter (Signed)
No ,have not seen pt  Recently-send to Dr. Juanda Chance

## 2012-11-23 ENCOUNTER — Encounter: Payer: Self-pay | Admitting: *Deleted

## 2012-11-28 ENCOUNTER — Telehealth: Payer: Self-pay | Admitting: Internal Medicine

## 2012-11-28 NOTE — Telephone Encounter (Signed)
Patient reports abdominal pain after meals.  The pain is at the level of her umbilicus and is across.  She has bloating after eating.  She is still taking her reglan, feedings, and now solid foods.  She will come in and see Dr. Juanda Chance on 12/06/12

## 2012-11-29 ENCOUNTER — Other Ambulatory Visit: Payer: Self-pay | Admitting: Geriatric Medicine

## 2012-11-29 MED ORDER — LORAZEPAM 0.5 MG PO TABS: ORAL_TABLET | ORAL | Status: DC

## 2012-12-05 IMAGING — XA IR REPLC DUODEN/JEJUNO TUBE PERCUT W/FLUORO
3 series · 4 of 4 positions shown · non-contrast
Comparison: none

CLINICAL HISTORY: Jejunal feeding tube fell out.  The patient needs
a new catheter.

[Series 2: care single · 1 of 1 slices shown (1 of 2)]
[im 1/1]
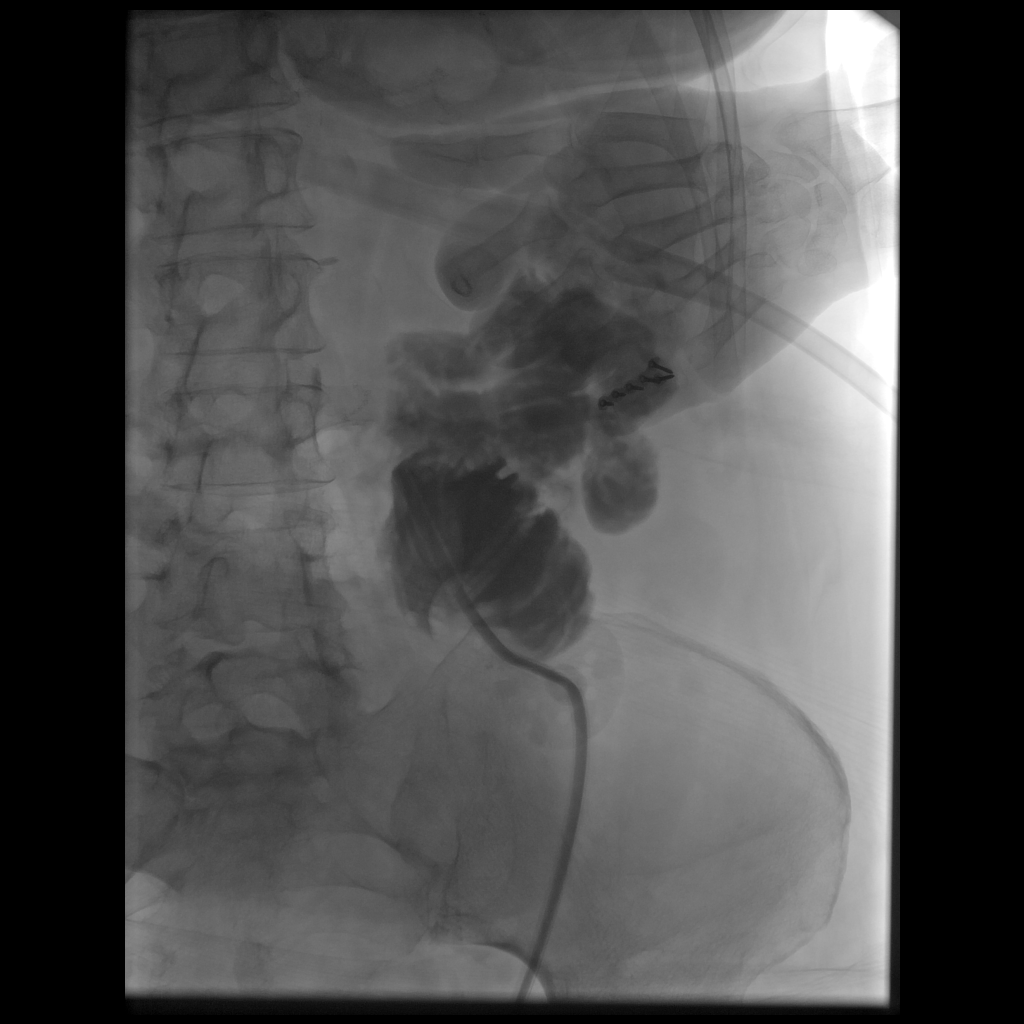

[Series 3: care single · 1 of 1 slices shown (2 of 2)]
[im 1/1]
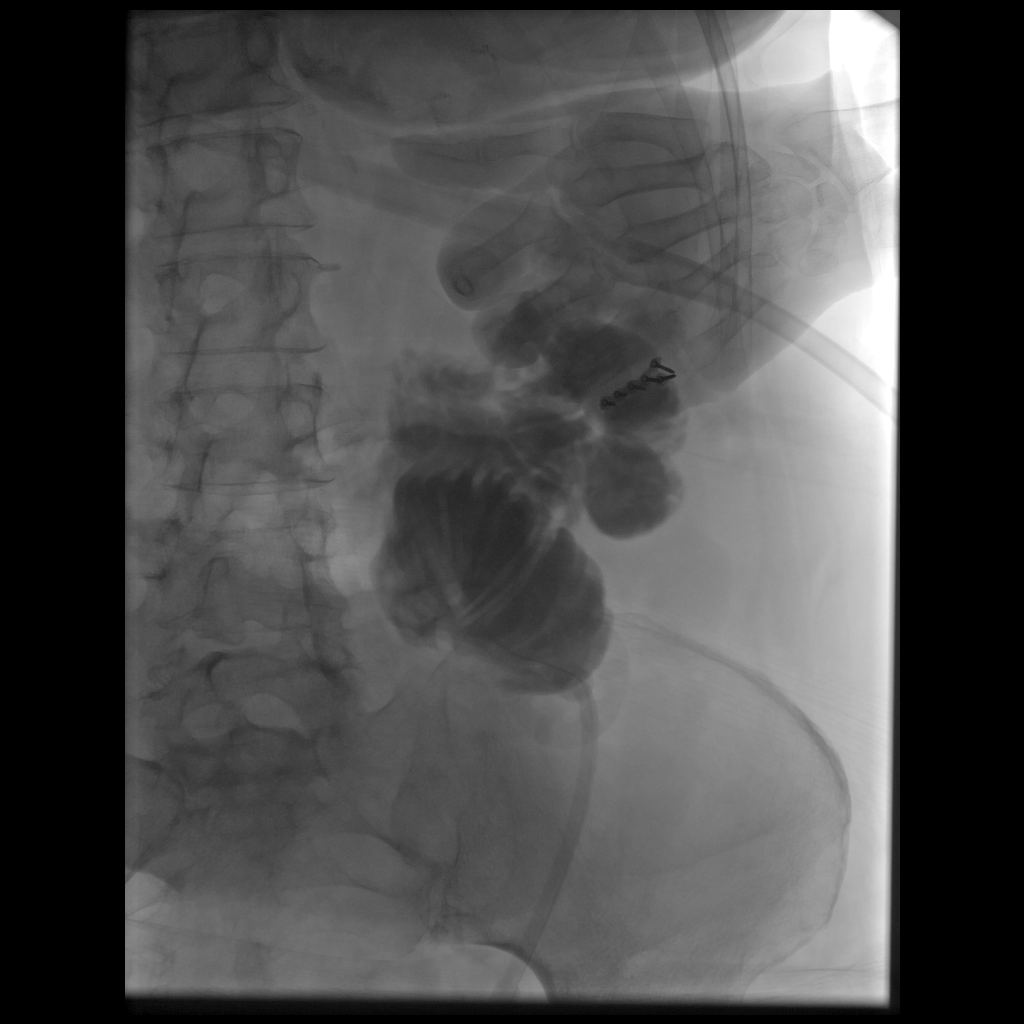

[Series 300: sp replc gastro/colonic tube percut w/fl · 2 of 2 slices shown]
[im 1/2]
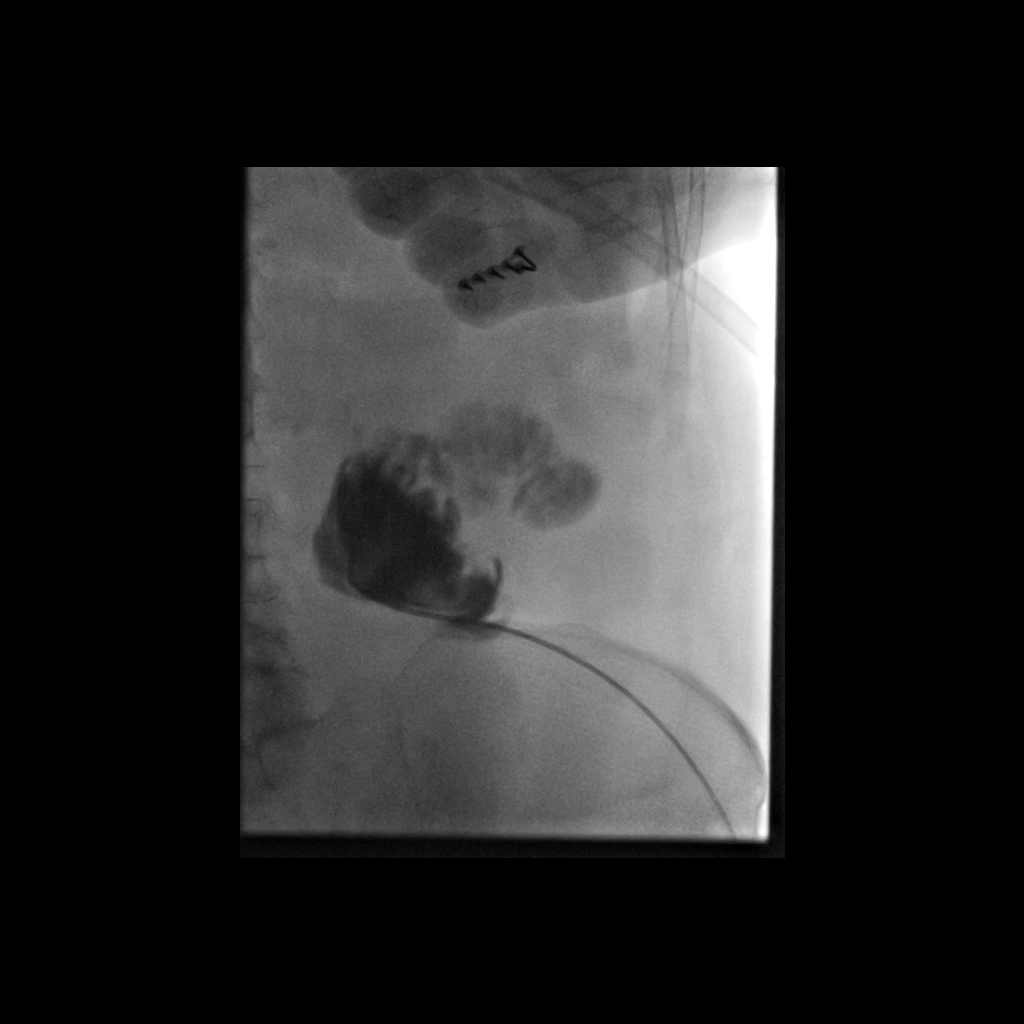
[im 2/2]
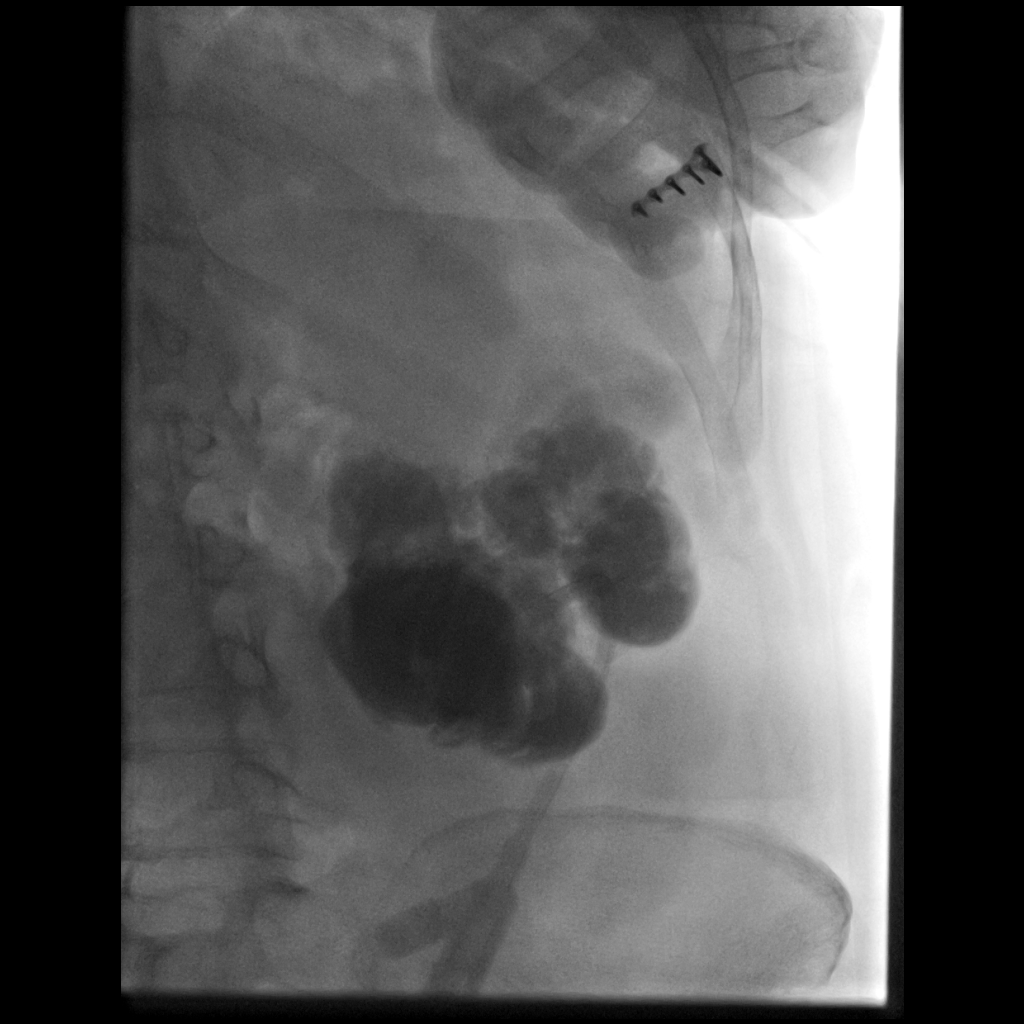

[4 of 4 positions shown; findings below may reference images not displayed]

PROCEDURE(S): REPLACEMENT OF JEJUNAL FEEDING TUBE

Medications:None

Moderate sedation time:None

Fluoroscopy time: 11.4 minutes

Procedure:The anterior abdomen was prepped and draped in a sterile
fashion. Viscous lidocaine was placed through the tube tract.  A 5-
French Kumpe catheter was advanced into the abdomen with
fluoroscopy.  Contrast injection confirmed placement within the
small bowel.  Additional contrast and air was injected.  Small
bowel loops in the left abdomen were identified. Attempts to
advance the catheter further into the small bowel were
unsuccessful.  Eventually, the jejunal feeding tube was cut just
beyond the balloon.  The tube was advanced into the small bowel
over a Benson wire.  The balloon was inflated with 7 ml of saline.
Contrast injection confirmed placement in the small bowel.
FINDINGS: Small bowel loops in the left abdomen were opacified.
Contrast was not moving throughout the remainder of the abdomen.  A
wire and catheter could not be advanced beyond the percutaneous
entrance site.  As a result, the catheter was cut very short and
placed just beyond the skin entry site.

Complications: None
IMPRESSION: Replacement of the jejunal feeding tube. The tube was
placed back into the small bowel but could not be advanced into the
distal small bowel as described.

## 2012-12-06 ENCOUNTER — Ambulatory Visit (INDEPENDENT_AMBULATORY_CARE_PROVIDER_SITE_OTHER): Payer: Medicare Other | Admitting: Internal Medicine

## 2012-12-06 ENCOUNTER — Encounter: Payer: Self-pay | Admitting: Internal Medicine

## 2012-12-06 VITALS — BP 124/80 | HR 120 | Ht 64.0 in | Wt 151.8 lb

## 2012-12-06 DIAGNOSIS — K3184 Gastroparesis: Secondary | ICD-10-CM

## 2012-12-06 DIAGNOSIS — Z903 Acquired absence of stomach [part of]: Secondary | ICD-10-CM

## 2012-12-06 DIAGNOSIS — R112 Nausea with vomiting, unspecified: Secondary | ICD-10-CM

## 2012-12-06 DIAGNOSIS — Z9889 Other specified postprocedural states: Secondary | ICD-10-CM

## 2012-12-06 MED ORDER — HYDROCODONE-ACETAMINOPHEN 2.5-325 MG PO TABS: 1.0000 | ORAL_TABLET | Freq: Four times a day (QID) | ORAL | Status: DC | PRN

## 2012-12-06 MED ORDER — ESOMEPRAZOLE MAGNESIUM 40 MG PO CPDR: 40.0000 mg | DELAYED_RELEASE_CAPSULE | Freq: Every day | ORAL | Status: DC

## 2012-12-06 NOTE — Patient Instructions (Addendum)
You have been scheduled for an endoscopy with propofol. Please follow written instructions given to you at your visit today. If you use inhalers (even only as needed), please bring them with you on the day of your procedure. Your physician has requested that you go to www.[email or web address].com and enter the access code given to you at your visit today. This web site gives a general overview about your procedure. However, you should still follow specific instructions given to you by our office regarding your preparation for the procedure.  We have sent the following medications to your pharmacy for you to pick up at your convenience: Nexium  CC: Dr Tyson Dense
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
## 2012-12-07 ENCOUNTER — Telehealth: Payer: Self-pay | Admitting: Internal Medicine

## 2012-12-07 ENCOUNTER — Other Ambulatory Visit: Payer: Self-pay | Admitting: *Deleted

## 2012-12-07 ENCOUNTER — Telehealth: Payer: Self-pay | Admitting: *Deleted

## 2012-12-07 ENCOUNTER — Ambulatory Visit (AMBULATORY_SURGERY_CENTER): Payer: Medicare Other | Admitting: Internal Medicine

## 2012-12-07 ENCOUNTER — Encounter: Payer: Self-pay | Admitting: Internal Medicine

## 2012-12-07 VITALS — BP 143/78 | HR 79 | Temp 98.6°F | Resp 16 | Ht 64.0 in | Wt 151.8 lb

## 2012-12-07 DIAGNOSIS — T85598A Other mechanical complication of other gastrointestinal prosthetic devices, implants and grafts, initial encounter: Secondary | ICD-10-CM

## 2012-12-07 DIAGNOSIS — K224 Dyskinesia of esophagus: Secondary | ICD-10-CM

## 2012-12-07 DIAGNOSIS — Z9889 Other specified postprocedural states: Secondary | ICD-10-CM

## 2012-12-07 DIAGNOSIS — R109 Unspecified abdominal pain: Secondary | ICD-10-CM

## 2012-12-07 DIAGNOSIS — K56609 Unspecified intestinal obstruction, unspecified as to partial versus complete obstruction: Secondary | ICD-10-CM

## 2012-12-07 DIAGNOSIS — K219 Gastro-esophageal reflux disease without esophagitis: Secondary | ICD-10-CM

## 2012-12-07 DIAGNOSIS — Z903 Acquired absence of stomach [part of]: Secondary | ICD-10-CM

## 2012-12-07 DIAGNOSIS — K3184 Gastroparesis: Secondary | ICD-10-CM

## 2012-12-07 DIAGNOSIS — R112 Nausea with vomiting, unspecified: Secondary | ICD-10-CM

## 2012-12-07 MED ORDER — SODIUM CHLORIDE 0.9 % IV SOLN: 500.0000 mL | INTRAVENOUS | Status: DC

## 2012-12-07 NOTE — Progress Notes (Signed)
J TUBE IN PLACE WITH PORT CLOSED.

## 2012-12-07 NOTE — Patient Instructions (Addendum)
UPPER GI SERIES WITH SMALL BOWEL FOLLOW THROUGH AT [facility] , Friday, AUGUST 8,2014 AT 9:30, ARRIVE AT 9:15. NOTHING TO EAT OR DRINK AFTER MIDNIGHT THE NIGHT PRIOR TO PROCEDURE.    YOU HAD AN ENDOSCOPIC PROCEDURE TODAY AT THE [facility]: Refer to the procedure report that was given to you for any specific questions about what was found during the examination.  If the procedure report does not answer your questions, please call your gastroenterologist to clarify.  If you requested that your care partner not be given the details of your procedure findings, then the procedure report has been included in a sealed envelope for you to review at your convenience later.  YOU SHOULD EXPECT: Some feelings of bloating in the abdomen. Passage of more gas than usual.  Walking can help get rid of the air that was put into your GI tract during the procedure and reduce the bloating. If you had a lower endoscopy (such as a colonoscopy or flexible sigmoidoscopy) you may notice spotting of blood in your stool or on the toilet paper. If you underwent a bowel prep for your procedure, then you may not have a normal bowel movement for a few days.  DIET: Your first meal following the procedure should be a light meal and then it is ok to progress to your normal diet.  A half-sandwich or bowl of soup is an example of a good first meal.  Heavy or fried foods are harder to digest and may make you feel nauseous or bloated.  Likewise meals heavy in dairy and vegetables can cause extra gas to form and this can also increase the bloating.  Drink plenty of fluids but you should avoid alcoholic beverages for 24 hours.  ACTIVITY: Your care partner should take you home directly after the procedure.  You should plan to take it easy, moving slowly for the rest of the day.  You can resume normal activity the day after the procedure however you should NOT DRIVE or use heavy machinery for 24 hours (because of the  sedation medicines used during the test).    SYMPTOMS TO REPORT IMMEDIATELY: A gastroenterologist can be reached at any hour.  During normal business hours, 8:30 AM to 5:00 PM Monday through Friday, call [phone].  After hours and on weekends, please call the GI answering service at [phone] who will take a message and have the physician on call contact you.   Following upper endoscopy (EGD)  Vomiting of blood or coffee ground material  New chest pain or pain under the shoulder blades  Painful or persistently difficult swallowing  New shortness of breath  Fever of 100F or higher  Black, tarry-looking stools  FOLLOW UP: If any biopsies were taken you will be contacted by phone or by letter within the next 1-3 weeks.  Call your gastroenterologist if you have not heard about the biopsies in 3 weeks.  Our staff will call the home number listed on your records the next business day following your procedure to check on you and address any questions or concerns that you may have at that time regarding the information given to you following your procedure. This is a courtesy call and so if there is no answer at the home number and we have not heard from you through the emergency physician on call, we will assume that you have returned to your regular daily activities without incident.  SIGNATURES/CONFIDENTIALITY: You and/or your care partner have signed  paperwork which will be entered into your electronic medical record.  These signatures attest to the fact that that the information above on your After Visit Summary has been reviewed and is understood.  Full responsibility of the confidentiality of this discharge information lies with you and/or your care-partner.

## 2012-12-07 NOTE — Telephone Encounter (Signed)
Scheduled UGI with Small Bowel follow through per Dr. Juanda Chance at [facility] radiology on 12/09/12 at 9:30 AM(Cheryl) NPO after midnight. Attention jejunal feeding tube r/o obstruction by feeding tube. Olegario Messier in [facility] recovery given appointment date, time and instructions to give to patient.
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
## 2012-12-07 NOTE — Op Note (Signed)
[facility] [address] [place], [address]   ENDOSCOPY PROCEDURE REPORT  PATIENT: Emily, [name]  MR#: [id number] BIRTHDATE: [birth year] , 72  yrs. old GENDER: Female ENDOSCOPIST: Hart Carwin, MD REFERRED BY:  Fleet Contras, M.D. PROCEDURE DATE:  12/07/2012 PROCEDURE:  EGD, diagnostic ASA CLASS:     Class III INDICATIONS:  Vomiting, known subtotal gastrectomy for failed Belsey gastrpexy, gastroparesis, feeding jejunostomy, pt developed vomiting of solid food, jejunal feedings going well. MEDICATIONS: MAC sedation, administered by CRNA and propofol (Diprivan) 200mg  IV TOPICAL ANESTHETIC: none  DESCRIPTION OF PROCEDURE: After the risks benefits and alternatives of the procedure were thoroughly explained, informed consent was obtained.  The LB [id number] endoscope was introduced through the mouth and advanced to the third portion of the duodenum. Without limitations.  The instrument was slowly withdrawn as the mucosa was fully examined.      [esophagus: Esophageal lumen was normal but it appeared somewhat thickened and there was small amount of retained secretions in the distal esophagus but  there was no food or esophagitis. Esophago-jejunal anastomosis was located at 30 cm from the incisors there was no significant  anastomoticstricture   . stomach: Patient is status post total gastrectomy. There was essentially no gastric remnant left jejunal lumen was widely patent there were no retained secretions or food Jejunum: At the level of 55 cm from the incisors the jejunal feeding tube was located with the balloon was inflated to full size and was partially obstructing the lumen I was unable to pass the endoscope beyond the balloon we deflated the balloon from 10 to about 5 cc's The scope was then withdrawn from the patient and the procedure completed.  COMPLICATIONS: There were no complications. ENDOSCOPIC IMPRESSION:  Status post the  esophagectomy and a soft frond low jejunostomy fold failed gastric emptying Stain secretions in the esophagus Widely patent esophagojejunostomy this Jejunal feeding tube with inflated balloon partially obstructing the jejunal lumen. We have deflated the balloon to have size   RECOMMENDATIONS: I think the vomiting was related to partial jejunal obstruction. Her to be feedings have been going well distal to the balloon but the solid food ingested through the mouth was partially obstructing at the level of the jejunal feeding tube. We will obtain upper GI with small bowel follow-through to assess the small bowel obstruction. Since patient has had significant oral intake and has improved nutritionally we may consider removing the jejunal feeding tube which has been there now for several years and may no longer be needed REPEAT EXAM: no  eSigned:  Hart Carwin, MD 12/07/2012 3:27 PM   CC:  PATIENT NAME:  Emily, [name] MR#: [id number]

## 2012-12-07 NOTE — Telephone Encounter (Signed)
Pt called to say feeding j-tube fell out completely. No pain or other issues. I spoke to Dr. Juanda Chance by phone, she feels j-tube should be left out entirely.  There was concern about obstructing the lumen of the small bowel anyway Pt will have small bowel follow through later this week I advised that she cover the old j-tube site with clean gauze secured by with tape. I asked that she call for any further problems or concerns

## 2012-12-08 ENCOUNTER — Other Ambulatory Visit: Payer: Self-pay | Admitting: Internal Medicine

## 2012-12-08 MED ORDER — HYDROCODONE-ACETAMINOPHEN 5-325 MG PO TABS: 1.0000 | ORAL_TABLET | Freq: Four times a day (QID) | ORAL | Status: DC | PRN

## 2012-12-09 ENCOUNTER — Ambulatory Visit (HOSPITAL_COMMUNITY)
Admission: RE | Admit: 2012-12-09 | Discharge: 2012-12-09 | Disposition: A | Discharge status: Home / Self Care | Payer: Medicare Other | Source: Ambulatory Visit | Attending: Internal Medicine | Admitting: Internal Medicine

## 2012-12-09 ENCOUNTER — Telehealth: Payer: Self-pay

## 2012-12-09 DIAGNOSIS — T85598A Other mechanical complication of other gastrointestinal prosthetic devices, implants and grafts, initial encounter: Secondary | ICD-10-CM

## 2012-12-09 DIAGNOSIS — K219 Gastro-esophageal reflux disease without esophagitis: Secondary | ICD-10-CM | POA: Insufficient documentation

## 2012-12-09 DIAGNOSIS — K449 Diaphragmatic hernia without obstruction or gangrene: Secondary | ICD-10-CM | POA: Insufficient documentation

## 2012-12-09 NOTE — Telephone Encounter (Signed)
  Follow up Call-  Call back number 12/07/2012 05/17/2012 02/15/2012 04/02/2011  Post procedure Call Back phone  # [phone] ok to leave a messag  Permission to leave phone message Yes Yes No -     Patient questions:  Do you have a fever, pain , or abdominal swelling? no Pain Score  0 *  Have you tolerated food without any problems? yes  Have you been able to return to your normal activities? yes  Do you have any questions about your discharge instructions: Diet   no Medications  no Follow up visit  no  Do you have questions or concerns about your Care? no  Actions: * If pain score is 4 or above: No action needed, pain <4.

## 2012-12-26 ENCOUNTER — Telehealth: Payer: Self-pay | Admitting: Internal Medicine

## 2012-12-26 NOTE — Telephone Encounter (Signed)
Ok TO REFILL, #20

## 2012-12-26 NOTE — Telephone Encounter (Signed)
Dr Juanda Chance- Patient requests refills on hydrocodone. We sent #20 on 12/08/12. Do you want me to refill early?

## 2012-12-27 MED ORDER — HYDROCODONE-ACETAMINOPHEN 5-325 MG PO TABS: 1.0000 | ORAL_TABLET | Freq: Four times a day (QID) | ORAL | Status: DC | PRN

## 2012-12-27 NOTE — Telephone Encounter (Signed)
rx sent

## 2012-12-30 ENCOUNTER — Ambulatory Visit: Payer: Medicare Other | Admitting: Internal Medicine

## 2013-01-10 ENCOUNTER — Other Ambulatory Visit: Payer: Self-pay | Admitting: *Deleted

## 2013-01-10 MED ORDER — PROMETHAZINE HCL 25 MG PO TABS: ORAL_TABLET | ORAL | Status: DC

## 2013-01-10 NOTE — Telephone Encounter (Signed)
rx sent for promethazine

## 2013-02-06 ENCOUNTER — Other Ambulatory Visit: Payer: Self-pay | Admitting: *Deleted

## 2013-02-06 MED ORDER — ESOMEPRAZOLE MAGNESIUM 40 MG PO CPDR: 40.0000 mg | DELAYED_RELEASE_CAPSULE | Freq: Every day | ORAL | Status: DC
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
## 2013-03-06 ENCOUNTER — Telehealth: Payer: Self-pay | Admitting: Internal Medicine

## 2013-03-06 NOTE — Telephone Encounter (Signed)
I have attempted to reach patient again. Her phone is still not working properly.

## 2013-03-06 NOTE — Telephone Encounter (Signed)
Attempted to reach patient. Her home phone is not working properly. I have called the mobile number listed in patient's chart. This is her daughter's number. Daughter states that she will let her mother know I am trying to contact her.

## 2013-03-07 ENCOUNTER — Encounter: Payer: Self-pay | Admitting: Internal Medicine

## 2013-03-07 MED ORDER — PROMETHAZINE HCL 25 MG PO TABS: ORAL_TABLET | ORAL | Status: DC

## 2013-03-07 NOTE — Telephone Encounter (Signed)
Error

## 2013-03-07 NOTE — Addendum Note (Signed)
Addended by: Richardson Chiquito on: 03/07/2013 10:55 AM   Modules accepted: Orders

## 2013-03-07 NOTE — Telephone Encounter (Signed)
Patient's phone still not working. We will await a return call from patient.

## 2013-03-07 NOTE — Telephone Encounter (Signed)
Patient's daughter called back. Her mother's phone connection was lost but is being restored. Patient wanted refills on promethazine. Rx has been sent to patient's pharmacy.

## 2013-03-16 IMAGING — CR DG HAND COMPLETE 3+V*L*
3 series · 3 of 3 positions shown · non-contrast
Comparison: 04/09/2011.

CLINICAL DATA: 71-year-old female status post fall with pain.
Surgery 2 months ago on the left fifth finger.

LEFT HAND - COMPLETE 3+ VIEW

[x hand pa left]
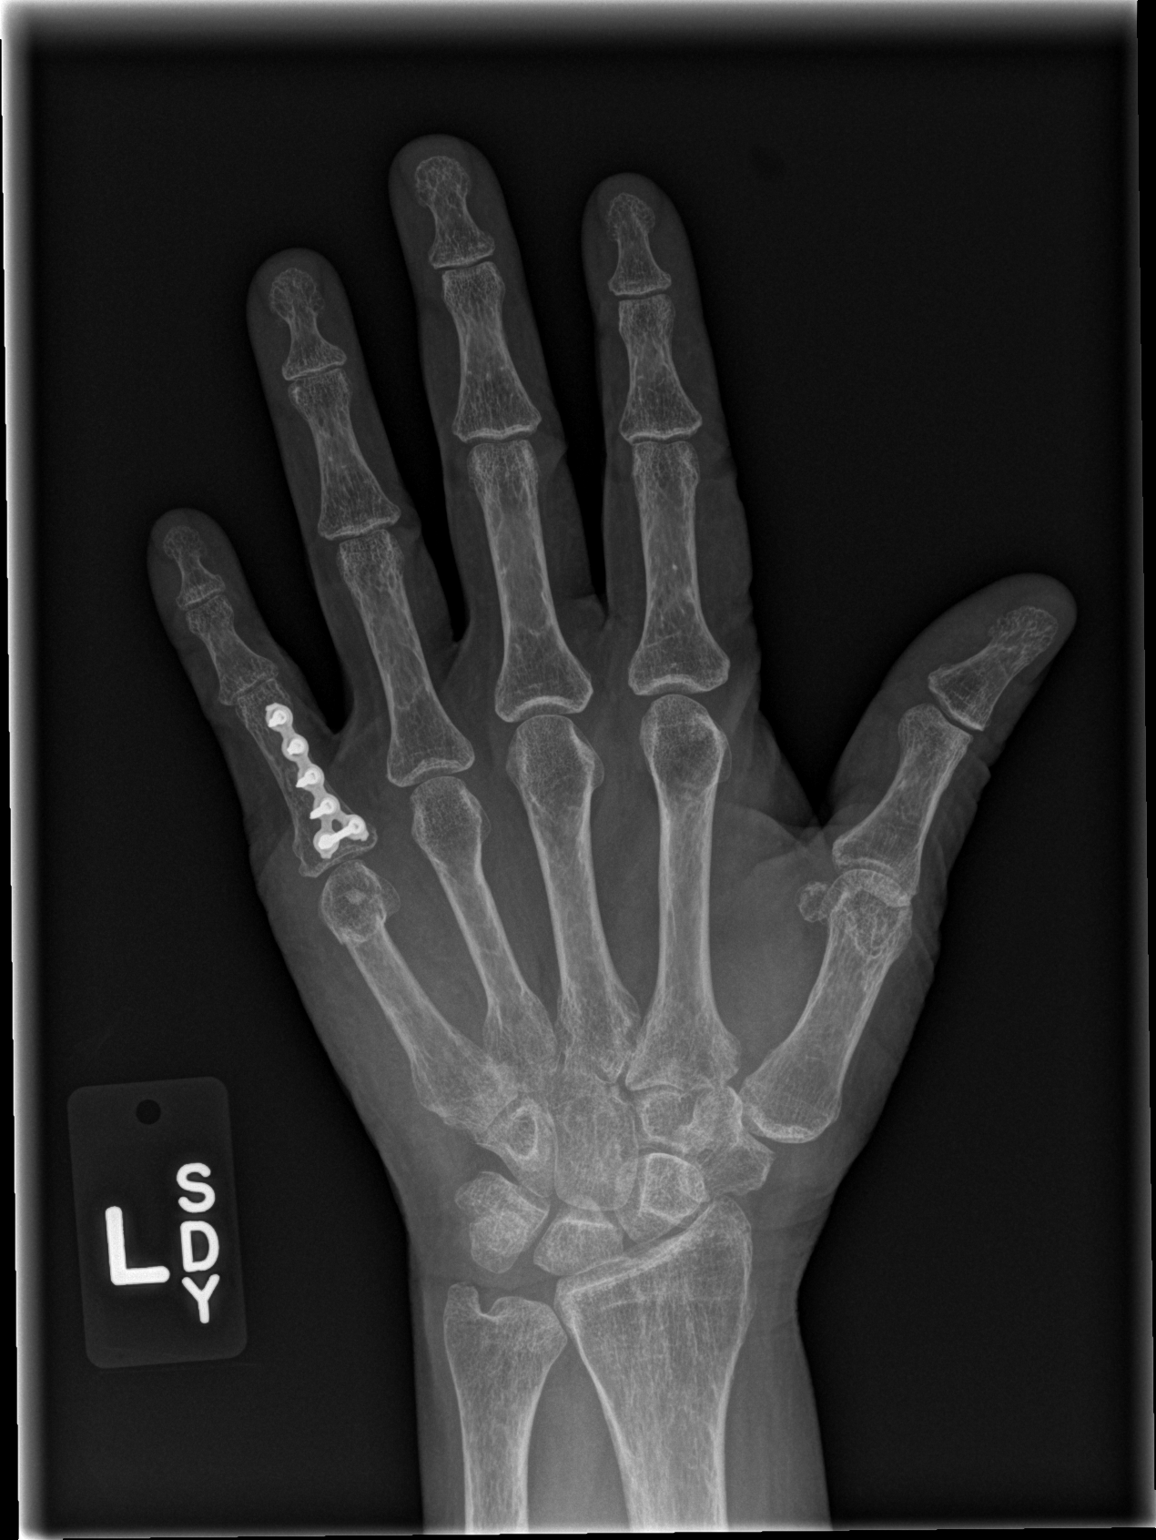

[x hand obl left]
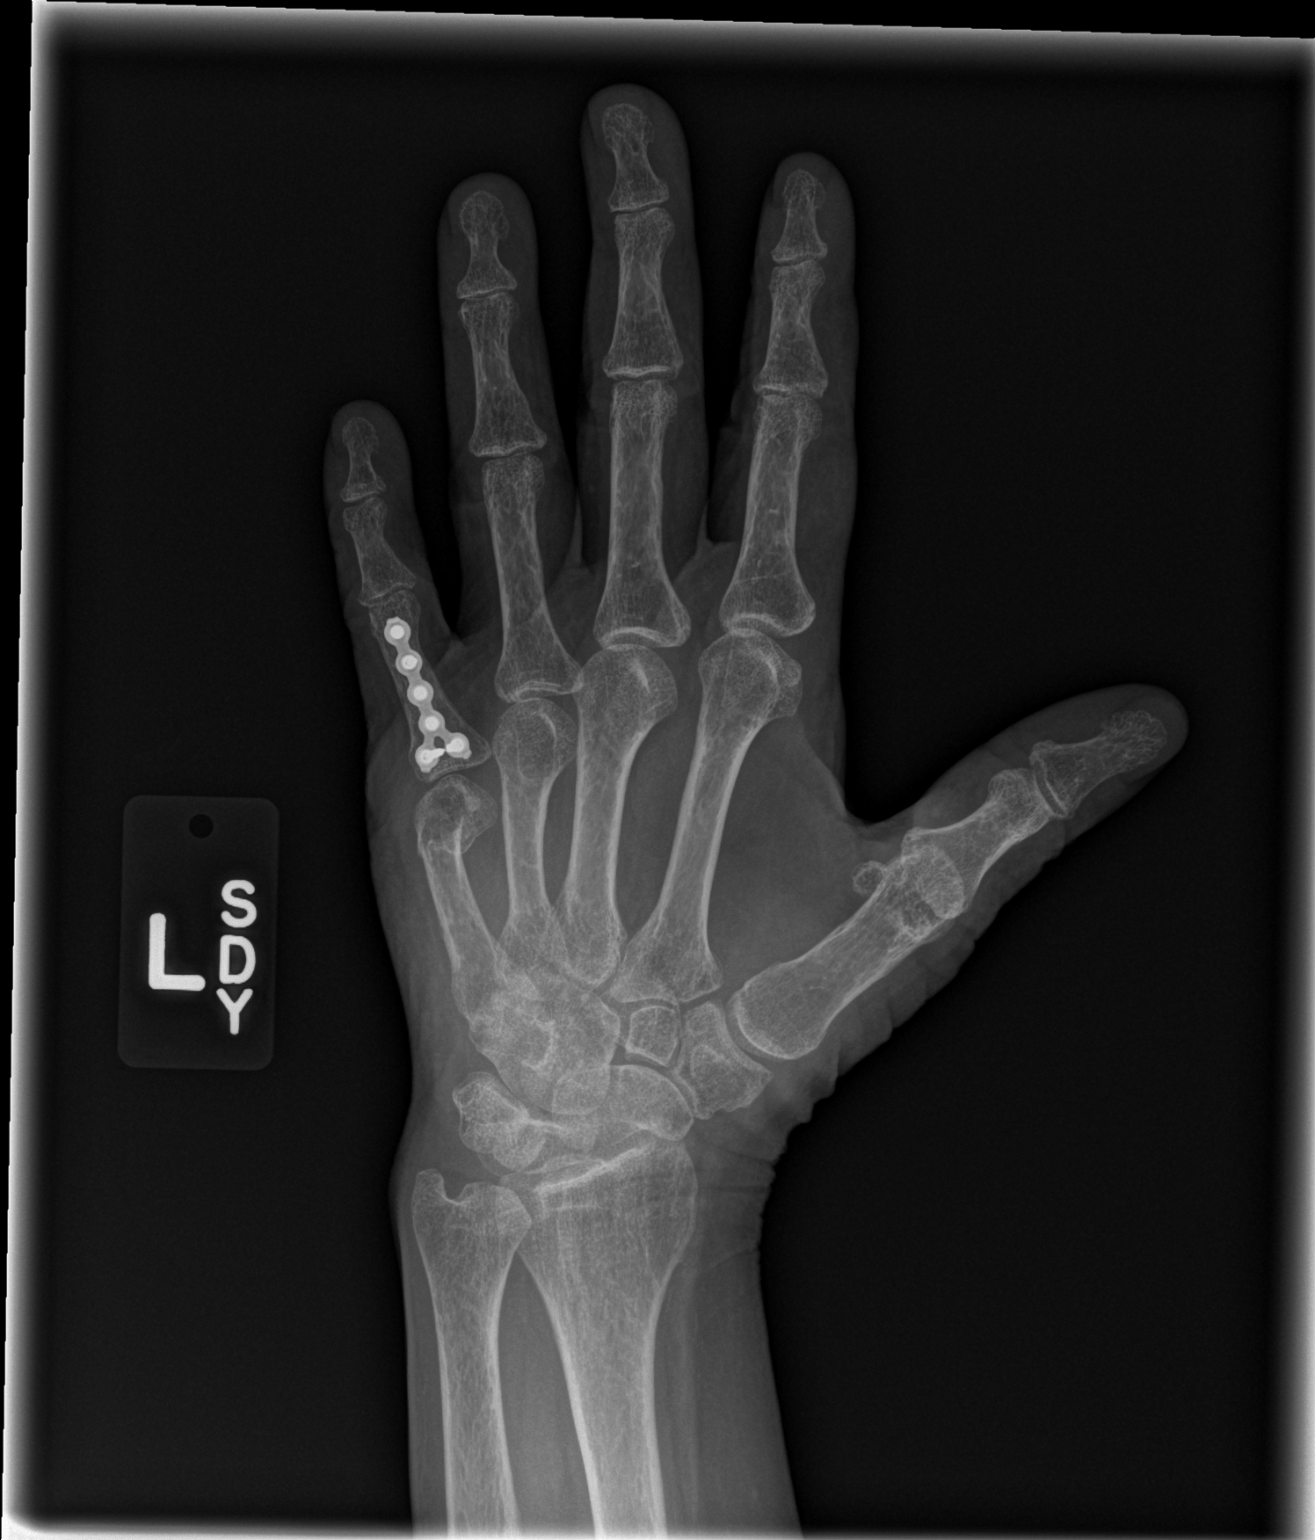

[x hand lat left]
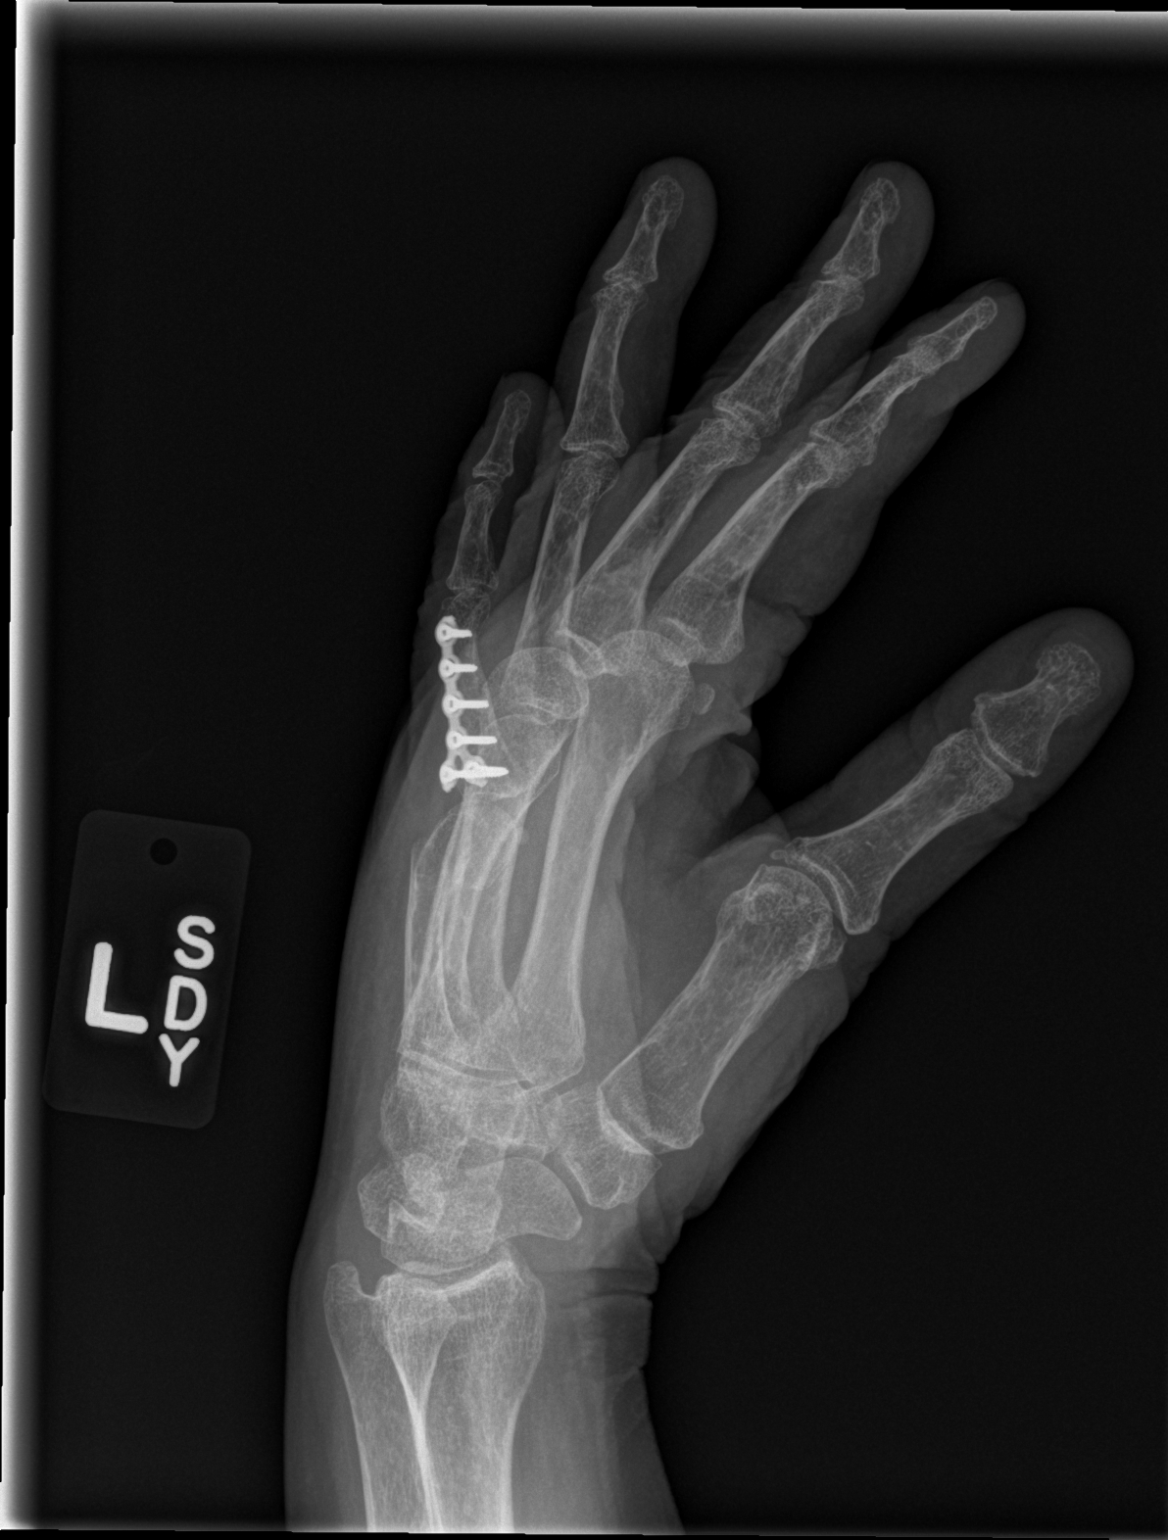

[3 of 3 positions shown; findings below may reference images not displayed]

FINDINGS: Interval ORIF of the left fifth proximal phalanx.  There
is a the distal fifth metacarpal fracture with volar angulation and
mild impaction.  This appears extra-articular.  The proximal
phalanx hardware appears intact.  Osteopenia throughout the left
hand.  Distal radius and ulna appear intact.  No other acute
fracture identified.
IMPRESSION: 1.  Distal left fifth metacarpal fracture with mild volar
angulation.
2.  Interval ORIF left fifth proximal phalanx with no adverse
features.
3.  Osteopenia.

## 2013-03-16 IMAGING — CR DG RIBS W/ CHEST 3+V*L*
4 series · 4 of 4 positions shown · non-contrast
Comparison: 08/30/2010 and earlier.

CLINICAL DATA: 71-year-old female status post fall with pain.

LEFT RIBS AND CHEST - 3+ VIEW

[t ribs ap upper left]
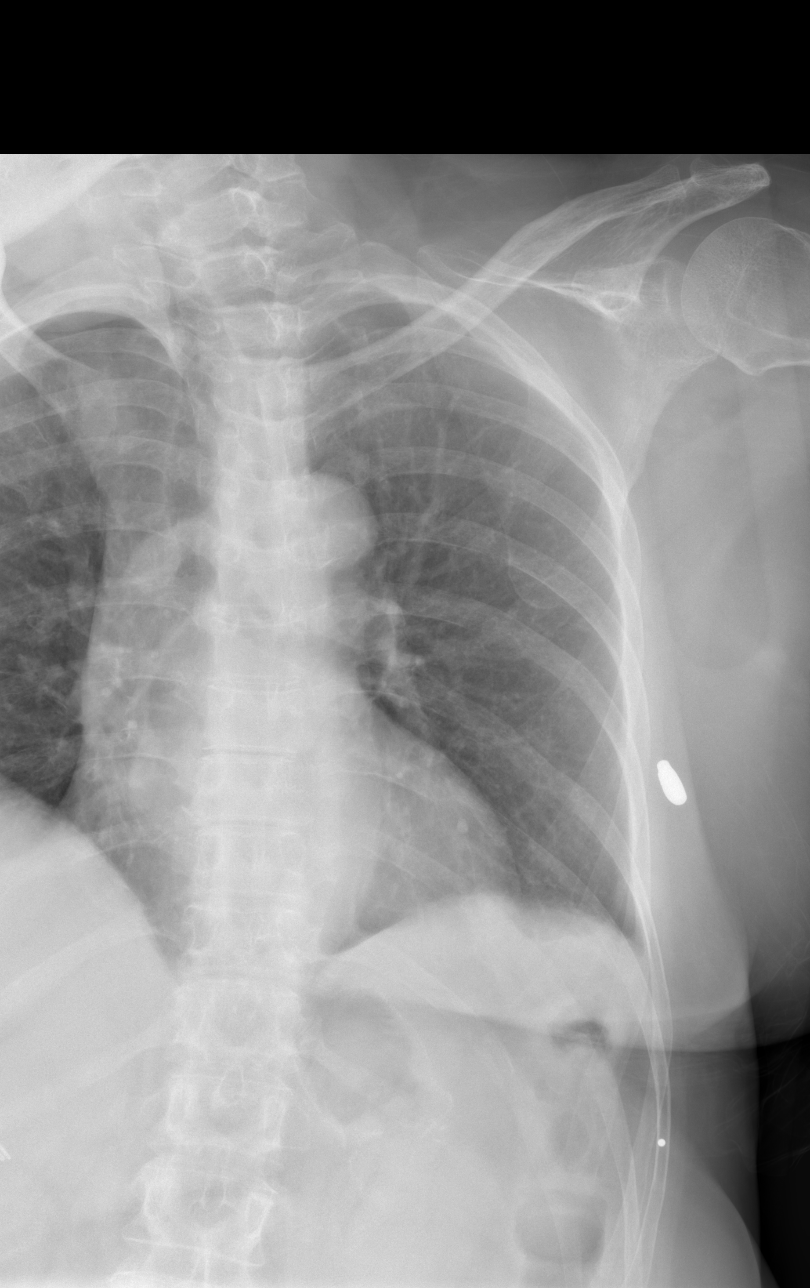

[t ribs ap lower left]
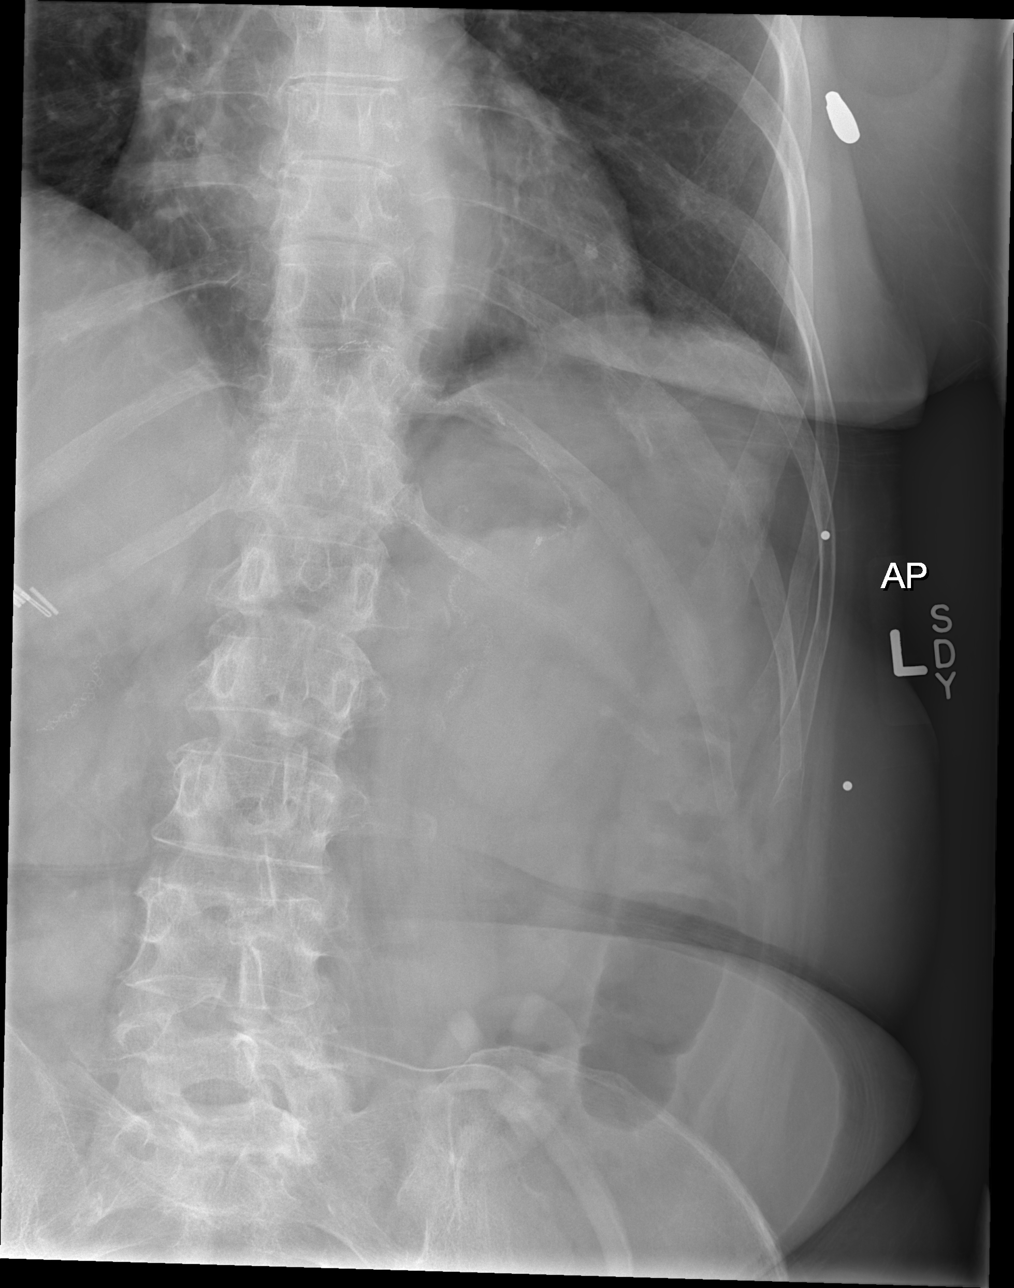

[t ribs lpo left]
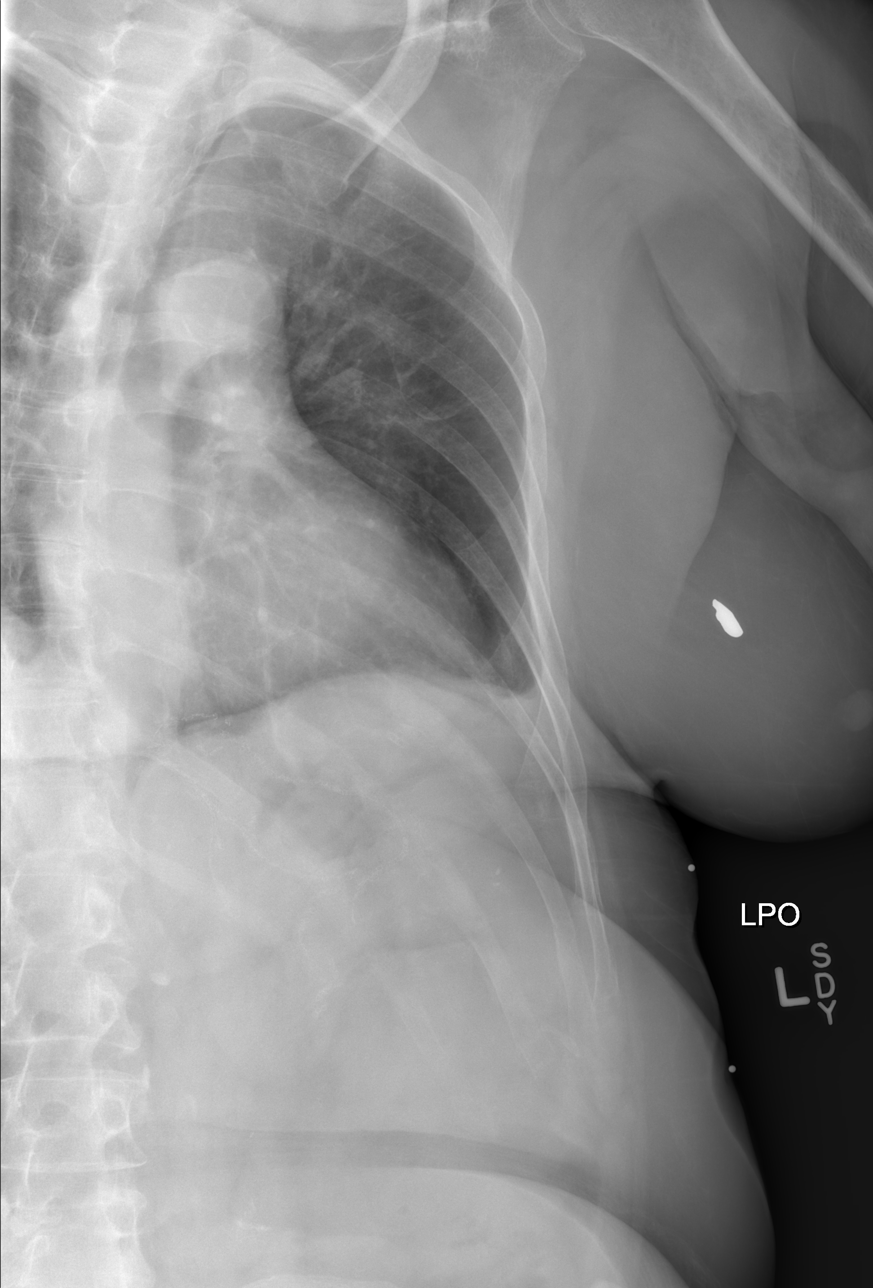

[x chest ap]
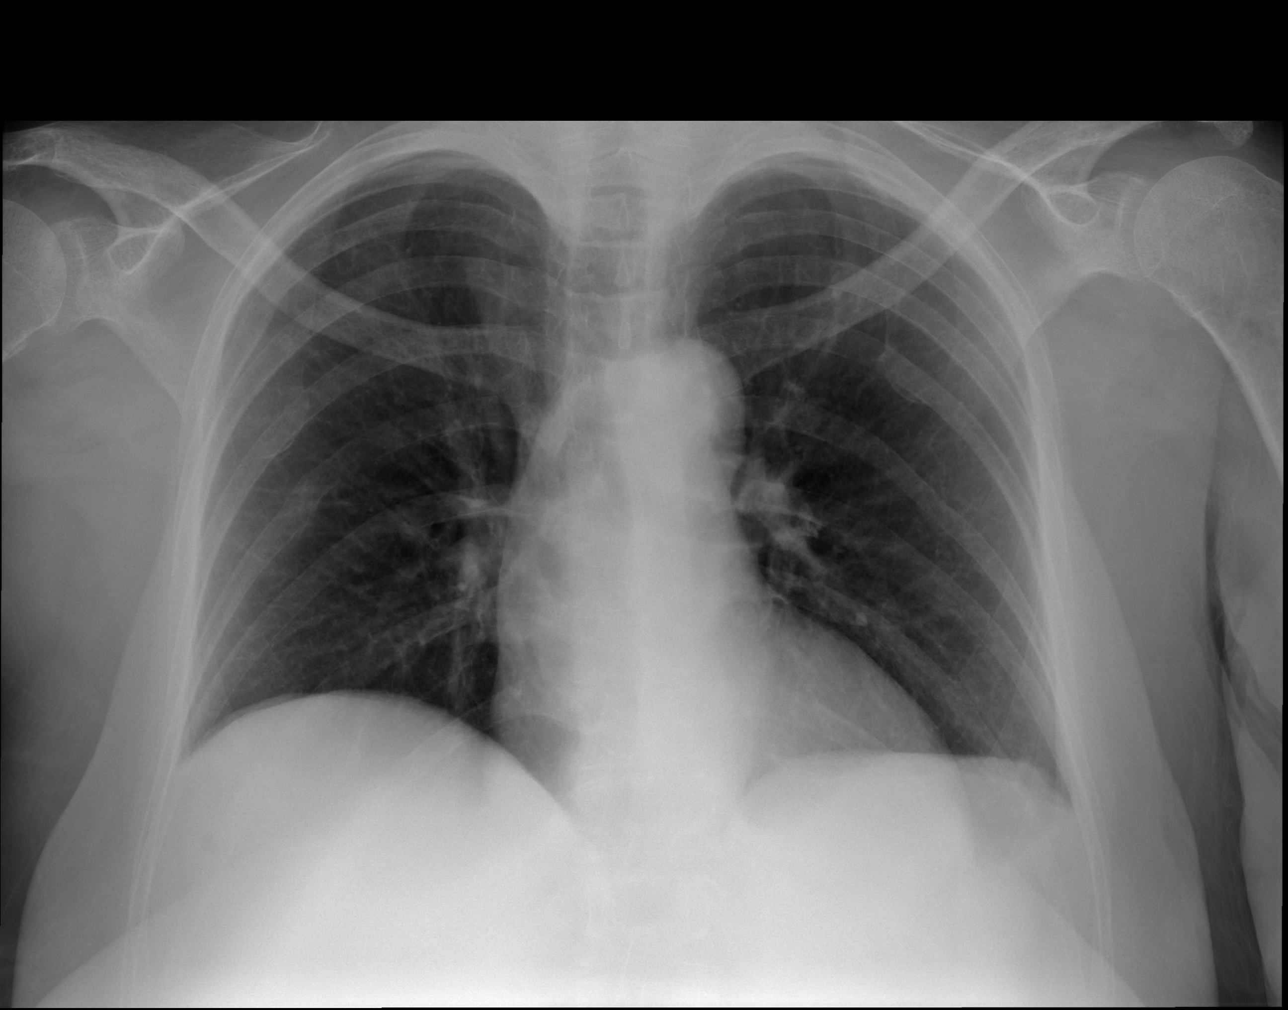

[4 of 4 positions shown; findings below may reference images not displayed]

FINDINGS: Seated AP view of the chest.  Stable low lung volumes.
Interval clearance of the left lung pneumonia. Possible persistent
small left pleural effusion or pleural scarring.  No pneumothorax,
or acute pulmonary opacity identified.

Chronic right eighth rib fracture is stable.  Oblique views of the
left ribs are provided with BB markers over the left lateral tenth
and eighth rib levels.  There is a minimally-displaced fracture of
the anterior left tenth rib near the costochondral margin.  There
is probably also a nondisplaced fracture of the anterior left ninth
rib.  There is a minimally-displaced 8th anterior rib fracture is
well.  Stable chronic metallic focus in the left breast soft
tissues.  Suture line in both upper abdominal quadrant as well as
right upper quadrant surgical clip.  There may be a percutaneous
feeding tube in place at the left lower abdomen.
IMPRESSION: 1.  Minimally to nondisplaced left anterior 8th ninth and tenth rib
fractures.  No pneumothorax.
2.  Possible small left pleural effusion or chronic pleural
scarring, unchanged since 08/30/2010.  Left lung pneumonia seen at
that time has resolved.

## 2013-05-03 ENCOUNTER — Other Ambulatory Visit: Payer: Self-pay | Admitting: *Deleted

## 2013-05-03 MED ORDER — PROMETHAZINE HCL 25 MG PO TABS: ORAL_TABLET | ORAL | Status: DC

## 2013-05-22 IMAGING — XA IR EVAL AND MANAGEMENT
1 series · 8 of 8 positions shown · non-contrast
Comparison: 07/16/2011

CLINICAL DATA: Leaking jejunostomy

CONSULTATION

[Series 300: ir radiologist eval & mgmt · 8 of 8 slices shown]
[im 1/8]
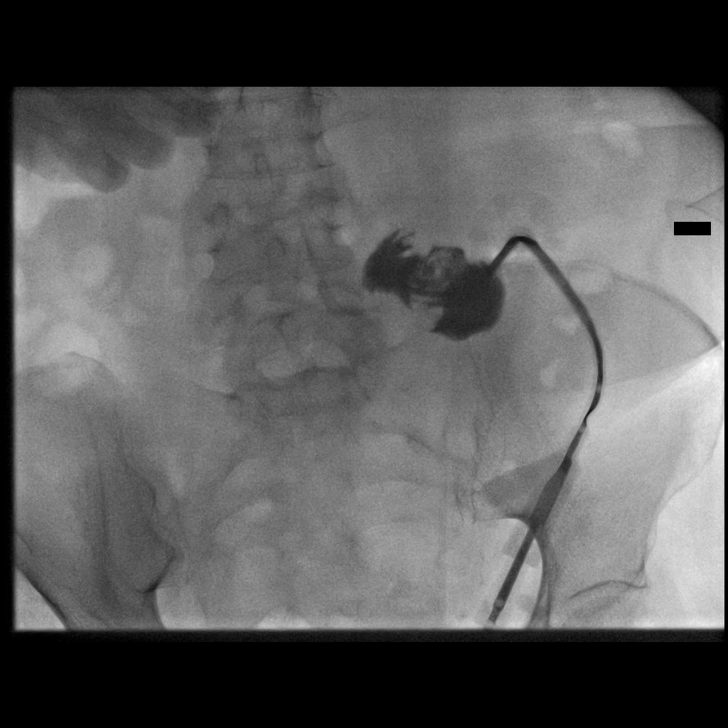
[im 2/8]
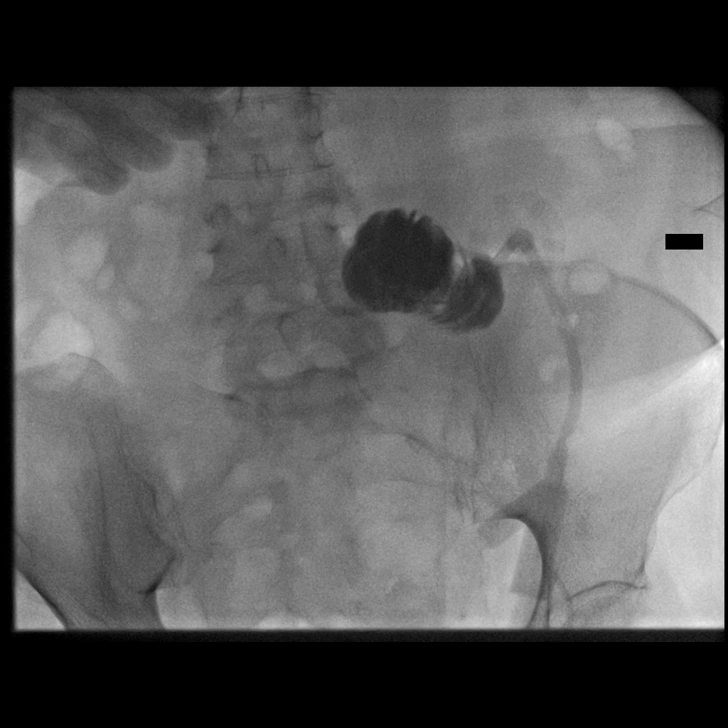
[im 3/8]
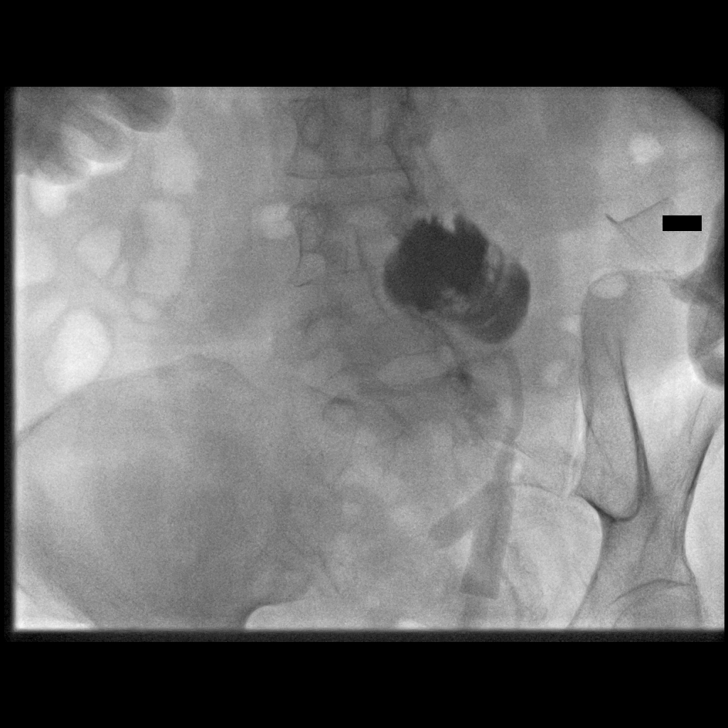
[im 4/8]
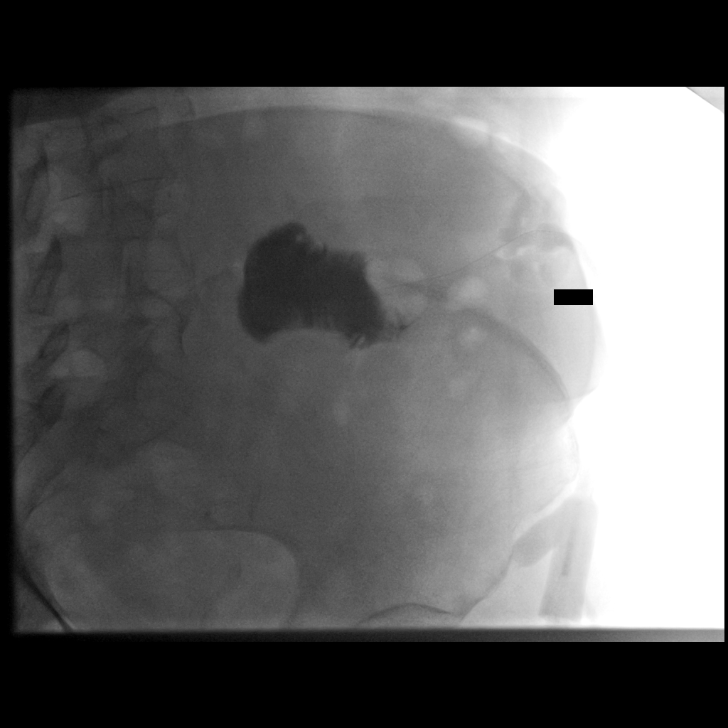
[im 5/8]
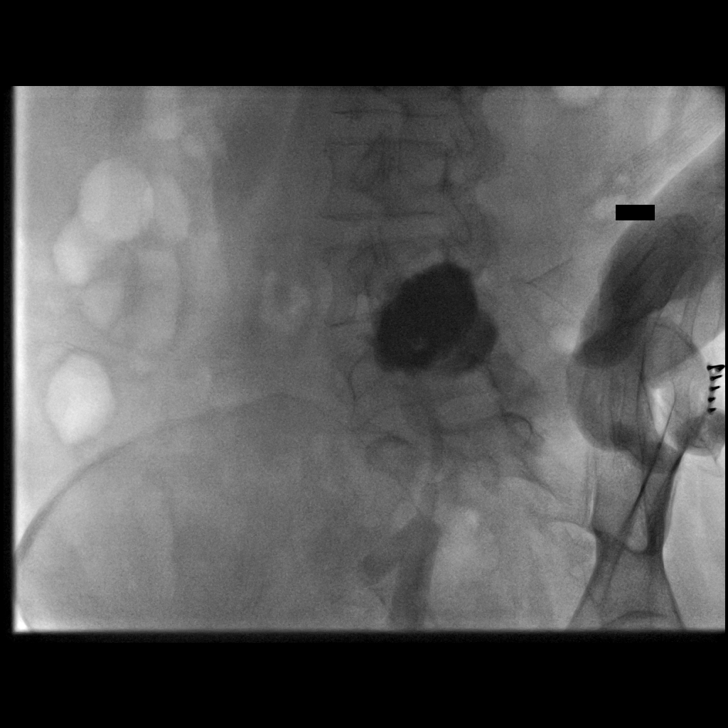
[im 6/8]
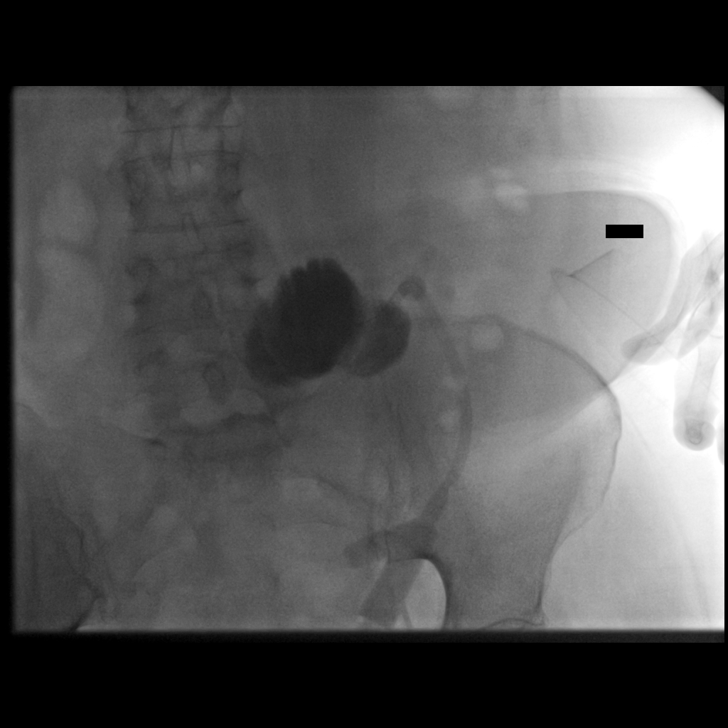
[im 7/8]
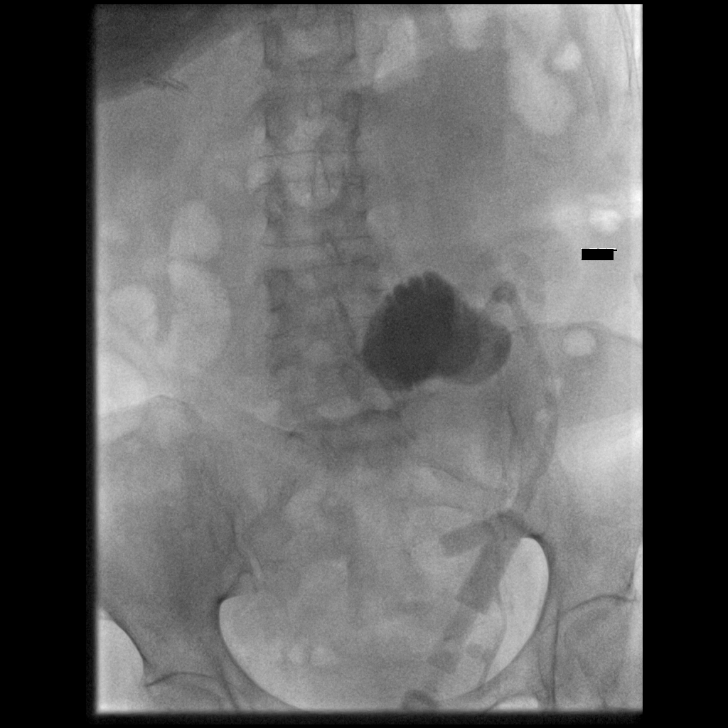
[im 8/8]
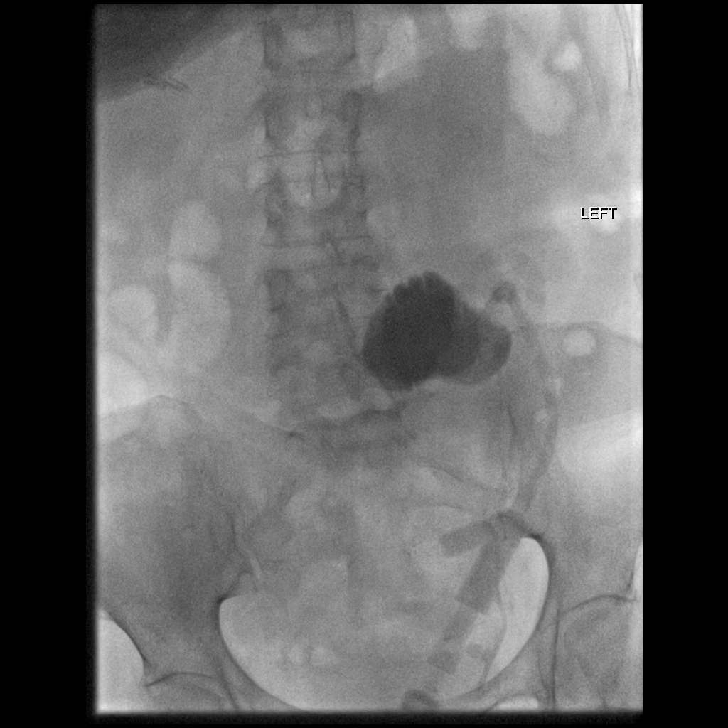

[8 of 8 positions shown; findings below may reference images not displayed]

FINDINGS: Leakage at the skin site noted.  The retention bumper was
advanced closer to the skin.  Upon observation this prevented
further leakage.  Contrast injection confirms position within a
loop of jejunum.  Images obtained for documentation.  No
extravasation or leakage.  Feeding tube ready for use.
IMPRESSION: Retention bumper secured externally to prevent skin leakage.
Feeding tube ready for use.

## 2013-05-23 ENCOUNTER — Other Ambulatory Visit: Payer: Self-pay | Admitting: *Deleted

## 2013-05-23 ENCOUNTER — Telehealth: Payer: Self-pay | Admitting: Gastroenterology

## 2013-05-23 ENCOUNTER — Telehealth: Payer: Self-pay | Admitting: Internal Medicine

## 2013-05-23 MED ORDER — SUCRALFATE 1 GM/10ML PO SUSP: ORAL | Status: DC

## 2013-05-23 NOTE — Telephone Encounter (Signed)
Patient calling to ask for Carafate rx. States she is sick to her stomach when she eats. States she vomited x 1 yesterday. Denies alcohol use.  Reports weight loss.States she has not had a tube since August. Reports some drainage around old tube site. Scheduled OV with Dr. Olevia Perches on 06/02/13 at 2:30 PM. Can she have rx for Carafate?

## 2013-05-23 NOTE — Telephone Encounter (Signed)
OK to send Carafate? Slurry, 10cc po bid, 12 oz 2 refills

## 2013-05-23 NOTE — Telephone Encounter (Signed)
ERROR

## 2013-05-23 NOTE — Telephone Encounter (Signed)
Rx sent to pharmacy. Patient notified. 

## 2013-05-29 ENCOUNTER — Telehealth: Payer: Self-pay | Admitting: *Deleted

## 2013-05-29 ENCOUNTER — Other Ambulatory Visit: Payer: Self-pay | Admitting: *Deleted

## 2013-05-29 MED ORDER — ESOMEPRAZOLE MAGNESIUM 40 MG PO CPDR: 40.0000 mg | DELAYED_RELEASE_CAPSULE | Freq: Every day | ORAL | Status: DC

## 2013-05-29 NOTE — Telephone Encounter (Signed)
Dr Olevia Perches, patient's insurance will not cover carafate liquid. They will cover sucralfate tablets. May I change her to Sucralfate 1 gram bid #60?

## 2013-05-29 NOTE — Telephone Encounter (Signed)
Actually, we have gotten an approval from [place] for carafate slurry from 02/28/13-05/29/14. See scanned document.

## 2013-06-02 ENCOUNTER — Other Ambulatory Visit: Payer: Self-pay | Admitting: Internal Medicine

## 2013-06-02 ENCOUNTER — Ambulatory Visit (HOSPITAL_COMMUNITY)
Admission: RE | Admit: 2013-06-02 | Discharge: 2013-06-02 | Disposition: A | Discharge status: Home / Self Care | Payer: Medicare Other | Source: Ambulatory Visit | Attending: Internal Medicine | Admitting: Internal Medicine

## 2013-06-02 ENCOUNTER — Encounter: Payer: Self-pay | Admitting: Internal Medicine

## 2013-06-02 ENCOUNTER — Ambulatory Visit (INDEPENDENT_AMBULATORY_CARE_PROVIDER_SITE_OTHER): Payer: Medicare Other | Admitting: Internal Medicine

## 2013-06-02 VITALS — BP 138/80 | HR 72 | Ht 64.0 in | Wt 128.6 lb

## 2013-06-02 DIAGNOSIS — Z87891 Personal history of nicotine dependence: Secondary | ICD-10-CM | POA: Insufficient documentation

## 2013-06-02 DIAGNOSIS — R633 Feeding difficulties, unspecified: Secondary | ICD-10-CM

## 2013-06-02 DIAGNOSIS — R079 Chest pain, unspecified: Secondary | ICD-10-CM | POA: Insufficient documentation

## 2013-06-02 DIAGNOSIS — K219 Gastro-esophageal reflux disease without esophagitis: Secondary | ICD-10-CM

## 2013-06-02 DIAGNOSIS — R071 Chest pain on breathing: Secondary | ICD-10-CM

## 2013-06-02 DIAGNOSIS — E119 Type 2 diabetes mellitus without complications: Secondary | ICD-10-CM | POA: Insufficient documentation

## 2013-06-02 DIAGNOSIS — W19XXXA Unspecified fall, initial encounter: Secondary | ICD-10-CM | POA: Insufficient documentation

## 2013-06-02 DIAGNOSIS — I10 Essential (primary) hypertension: Secondary | ICD-10-CM | POA: Insufficient documentation

## 2013-06-02 DIAGNOSIS — K3184 Gastroparesis: Secondary | ICD-10-CM

## 2013-06-02 MED ORDER — PROMETHAZINE HCL 25 MG PO TABS: ORAL_TABLET | ORAL | Status: DC

## 2013-06-02 MED ORDER — LORAZEPAM 0.5 MG PO TABS: ORAL_TABLET | ORAL | Status: DC

## 2013-06-02 NOTE — Patient Instructions (Signed)
We have sent the following medications to your pharmacy for you to pick up at your convenience: Phenergan Ativan  Please discontinue metoclopramide (Reglan).  CC: Dr Nolene Ebbs
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
## 2013-06-05 ENCOUNTER — Other Ambulatory Visit: Payer: Self-pay | Admitting: Internal Medicine

## 2013-06-05 DIAGNOSIS — G459 Transient cerebral ischemic attack, unspecified: Secondary | ICD-10-CM

## 2013-06-07 ENCOUNTER — Ambulatory Visit
Admission: RE | Admit: 2013-06-07 | Discharge: 2013-06-07 | Disposition: A | Discharge status: Home / Self Care | Payer: Medicaid Other | Source: Ambulatory Visit | Attending: Internal Medicine | Admitting: Internal Medicine

## 2013-06-07 DIAGNOSIS — G459 Transient cerebral ischemic attack, unspecified: Secondary | ICD-10-CM

## 2013-06-12 ENCOUNTER — Ambulatory Visit: Payer: Medicare Other | Attending: Internal Medicine | Admitting: Physical Therapy

## 2013-06-12 DIAGNOSIS — M6281 Muscle weakness (generalized): Secondary | ICD-10-CM | POA: Insufficient documentation

## 2013-06-12 DIAGNOSIS — R5381 Other malaise: Secondary | ICD-10-CM | POA: Insufficient documentation

## 2013-06-12 DIAGNOSIS — IMO0001 Reserved for inherently not codable concepts without codable children: Secondary | ICD-10-CM | POA: Insufficient documentation

## 2013-06-14 ENCOUNTER — Other Ambulatory Visit (HOSPITAL_COMMUNITY): Payer: Self-pay | Admitting: Internal Medicine

## 2013-06-14 DIAGNOSIS — I639 Cerebral infarction, unspecified: Secondary | ICD-10-CM

## 2013-06-19 ENCOUNTER — Ambulatory Visit: Payer: Medicare Other

## 2013-06-20 ENCOUNTER — Ambulatory Visit (HOSPITAL_COMMUNITY): Payer: Medicare Other

## 2013-06-22 ENCOUNTER — Ambulatory Visit: Payer: Medicare Other | Admitting: Physical Therapy

## 2013-06-22 ENCOUNTER — Encounter: Payer: Self-pay | Admitting: Neurology

## 2013-06-23 ENCOUNTER — Ambulatory Visit (INDEPENDENT_AMBULATORY_CARE_PROVIDER_SITE_OTHER): Payer: Medicare Other | Admitting: Neurology

## 2013-06-23 ENCOUNTER — Encounter: Payer: Self-pay | Admitting: Neurology

## 2013-06-23 VITALS — BP 145/82 | HR 80 | Ht 64.0 in | Wt 129.0 lb

## 2013-06-23 DIAGNOSIS — I6350 Cerebral infarction due to unspecified occlusion or stenosis of unspecified cerebral artery: Secondary | ICD-10-CM

## 2013-06-23 DIAGNOSIS — I679 Cerebrovascular disease, unspecified: Secondary | ICD-10-CM

## 2013-06-23 HISTORY — DX: Cerebrovascular disease, unspecified: I67.9
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
## 2013-06-26 ENCOUNTER — Ambulatory Visit: Payer: Medicare Other | Admitting: Rehabilitation

## 2013-06-29 ENCOUNTER — Encounter: Payer: Medicare Other | Admitting: Rehabilitation

## 2013-06-30 ENCOUNTER — Ambulatory Visit (HOSPITAL_COMMUNITY)
Admission: RE | Admit: 2013-06-30 | Discharge: 2013-06-30 | Disposition: A | Discharge status: Home / Self Care | Payer: Medicare Other | Source: Ambulatory Visit | Attending: Internal Medicine | Admitting: Internal Medicine

## 2013-06-30 DIAGNOSIS — I369 Nonrheumatic tricuspid valve disorder, unspecified: Secondary | ICD-10-CM

## 2013-06-30 DIAGNOSIS — R4789 Other speech disturbances: Secondary | ICD-10-CM

## 2013-06-30 DIAGNOSIS — I639 Cerebral infarction, unspecified: Secondary | ICD-10-CM

## 2013-06-30 DIAGNOSIS — G459 Transient cerebral ischemic attack, unspecified: Secondary | ICD-10-CM | POA: Insufficient documentation

## 2013-06-30 NOTE — Progress Notes (Signed)
*  PRELIMINARY RESULTS* Vascular Ultrasound Carotid Duplex (Doppler) has been completed.  Preliminary findings: Bilateral:  1-39% ICA stenosis.  Vertebral artery flow is antegrade.      Landry Mellow, RDMS, RVT  06/30/2013, 2:34 PM

## 2013-06-30 NOTE — Progress Notes (Signed)
  Echocardiogram 2D Echocardiogram has been performed.  Emily [name] 06/30/2013, 1:28 PM

## 2013-07-02 IMAGING — XA IR GI TUBE CONTRAST INJECTION
1 series · 4 of 4 positions shown · non-contrast
Comparison: 12/31/2011

CLINICAL DATA: Leaking jejunostomy

GI TUBE INJECTION

[Series 300: sp cm inj any colonic tube w/fluoro · 4 of 4 slices shown]
[im 1/4]
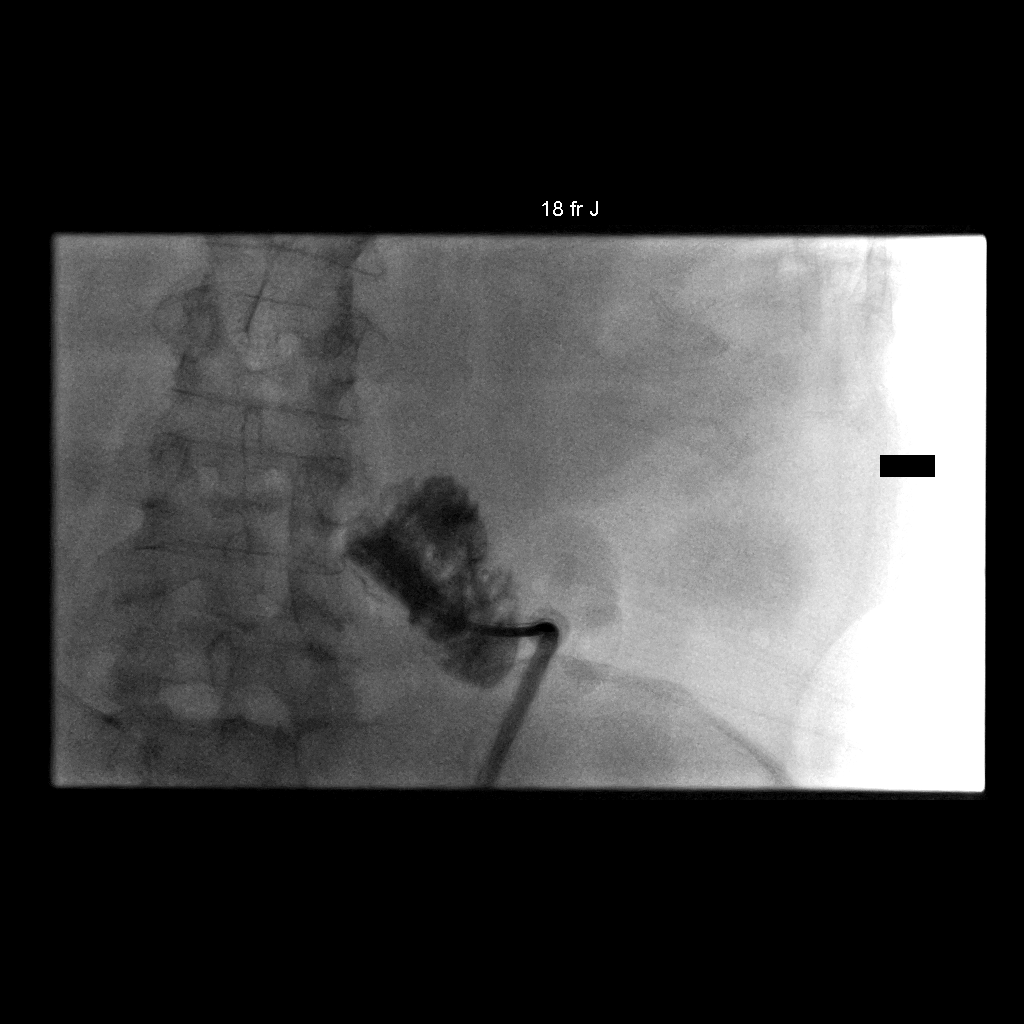
[im 2/4]
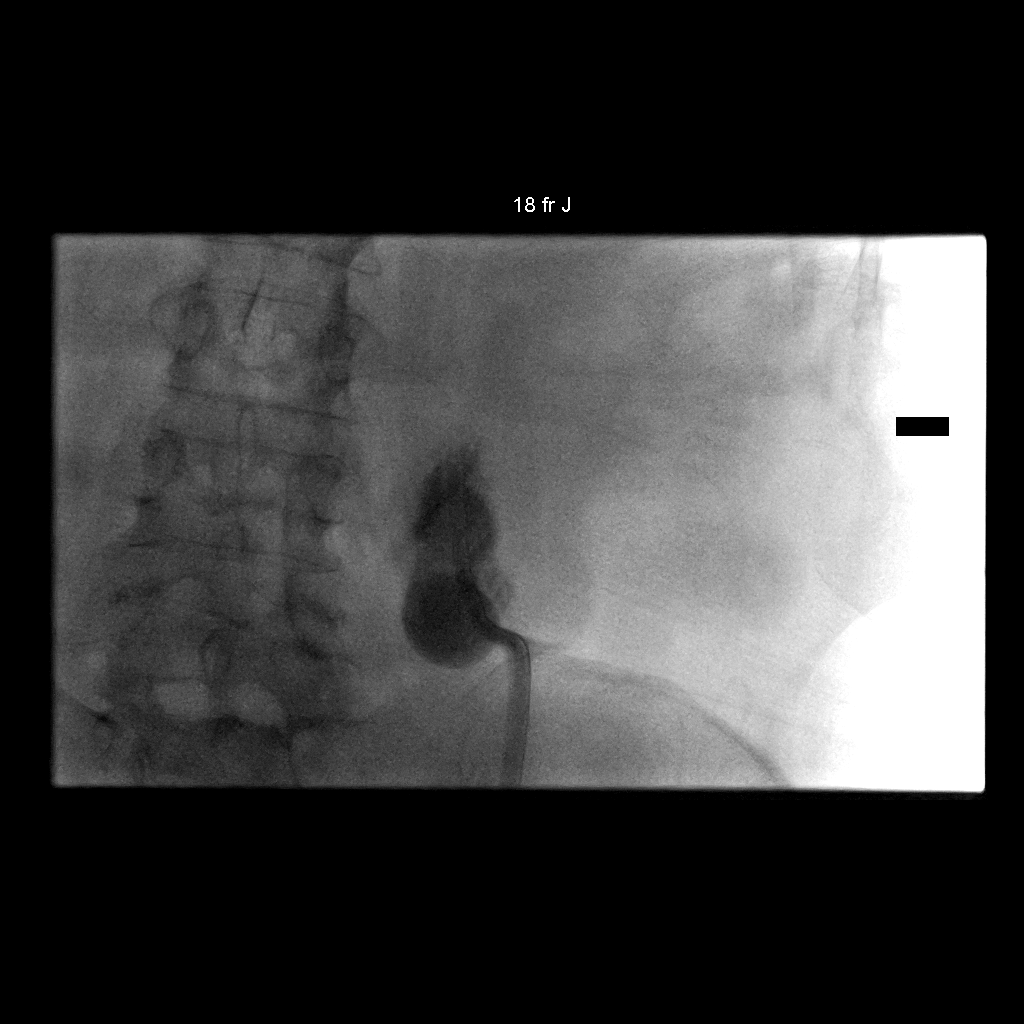
[im 3/4]
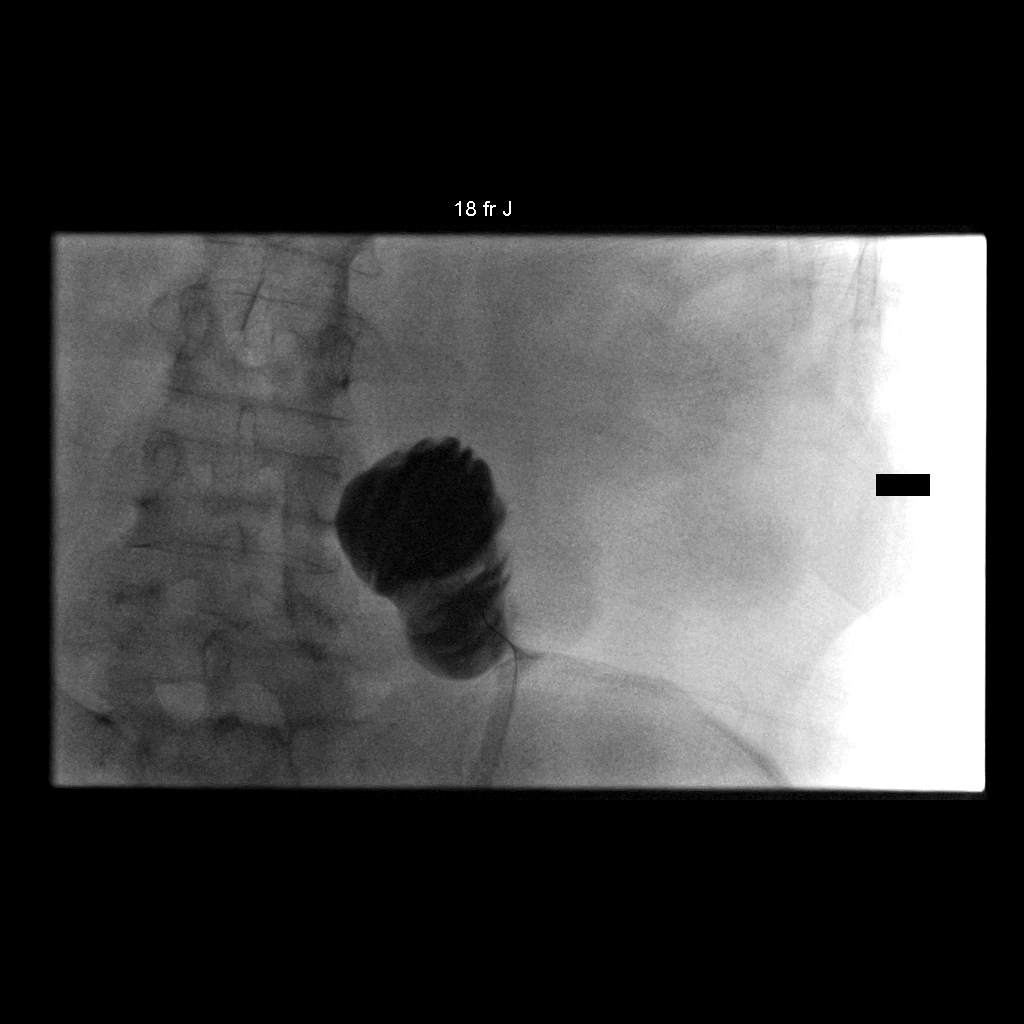
[im 4/4]
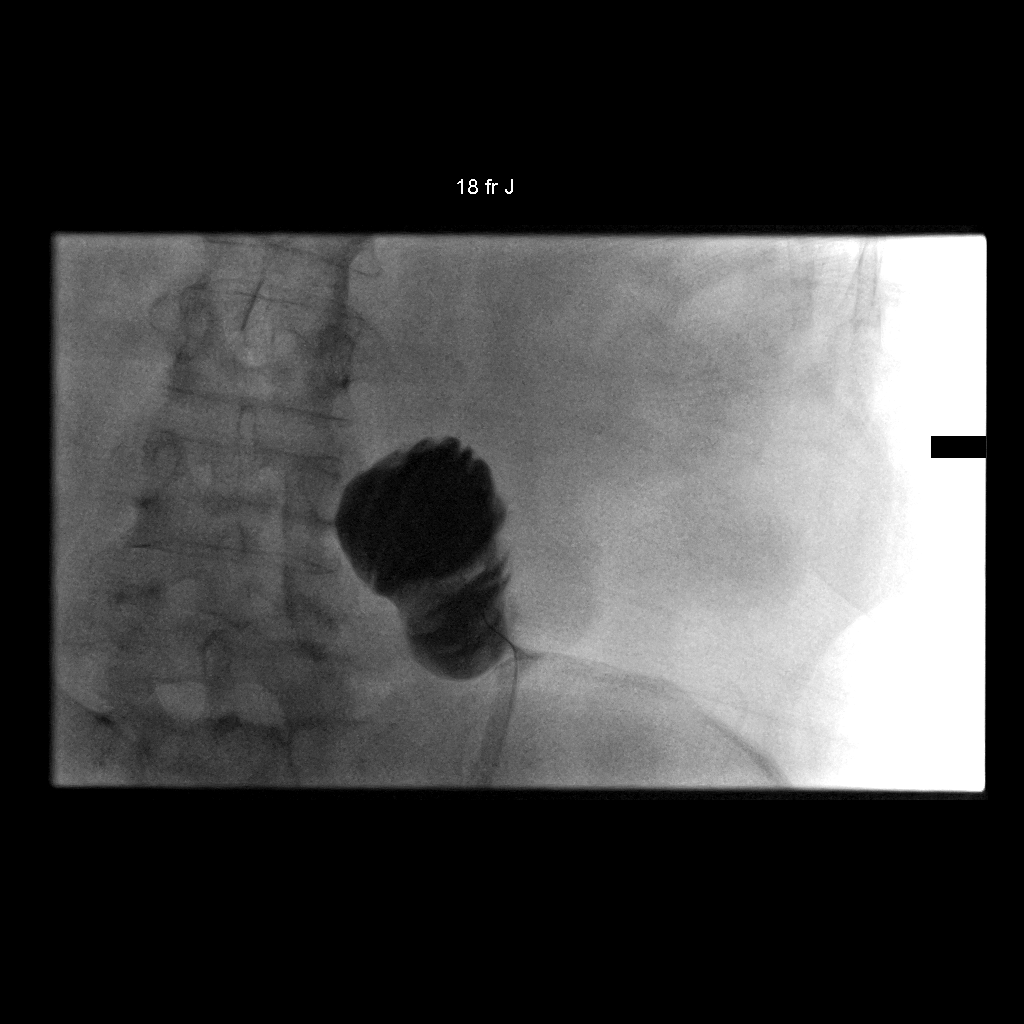

[4 of 4 positions shown; findings below may reference images not displayed]

FINDINGS: Under sterile conditions, the existing occluded damaged
jejunostomy was cut removed over a glide wire.  A new 18-French
jejunostomy was advanced after being cut 4 cm distal to the
retention balloon as before.  Contrast injection confirms position.
Retention balloon inflated with 7 ml saline containing 1 ml
contrast.  Catheter secured externally.  No immediate complication.
IMPRESSION: Fluoroscopic replacement of a 18-French jejunostomy.  Ready for
use.

## 2013-07-04 ENCOUNTER — Ambulatory Visit: Payer: Medicare HMO | Attending: Internal Medicine | Admitting: Rehabilitation

## 2013-07-04 DIAGNOSIS — R5381 Other malaise: Secondary | ICD-10-CM | POA: Diagnosis not present

## 2013-07-04 DIAGNOSIS — IMO0001 Reserved for inherently not codable concepts without codable children: Secondary | ICD-10-CM | POA: Diagnosis present

## 2013-07-04 DIAGNOSIS — M6281 Muscle weakness (generalized): Secondary | ICD-10-CM | POA: Diagnosis not present

## 2013-07-05 ENCOUNTER — Inpatient Hospital Stay: Admission: RE | Admit: 2013-07-05 | Payer: Medicare Other | Source: Ambulatory Visit

## 2013-07-06 ENCOUNTER — Ambulatory Visit: Payer: Medicare HMO | Admitting: Rehabilitation

## 2013-07-06 DIAGNOSIS — IMO0001 Reserved for inherently not codable concepts without codable children: Secondary | ICD-10-CM | POA: Diagnosis not present

## 2013-07-07 IMAGING — XA IR REPLC DUODEN/JEJUNO TUBE PERCUT W/FLUORO
1 series · 8 of 8 positions shown · non-contrast
Comparison: Fluoroscopic guided jejunostomy tube exchanged -
02/10/2012;

INDICATION: Persistent leaking from the jejunostomy tube

FLUROSCOPIC GUIDED REPLACEMENT/UPSIZAING AND LENGTHENING OF
JEJUNOSTOMY TUBE

[Series 300: sp replc duoden/jejuno tube percut w/flu · 8 of 8 slices shown]
[im 1/8]
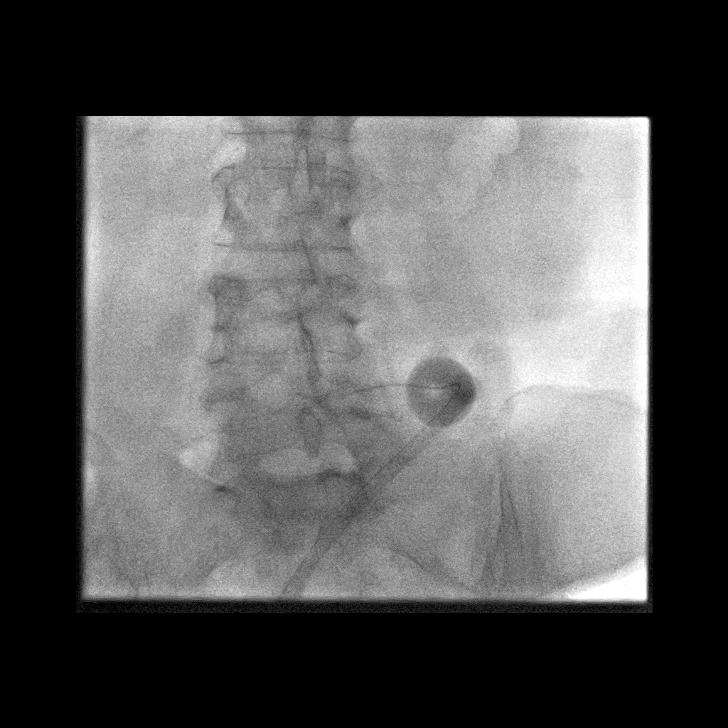
[im 2/8]
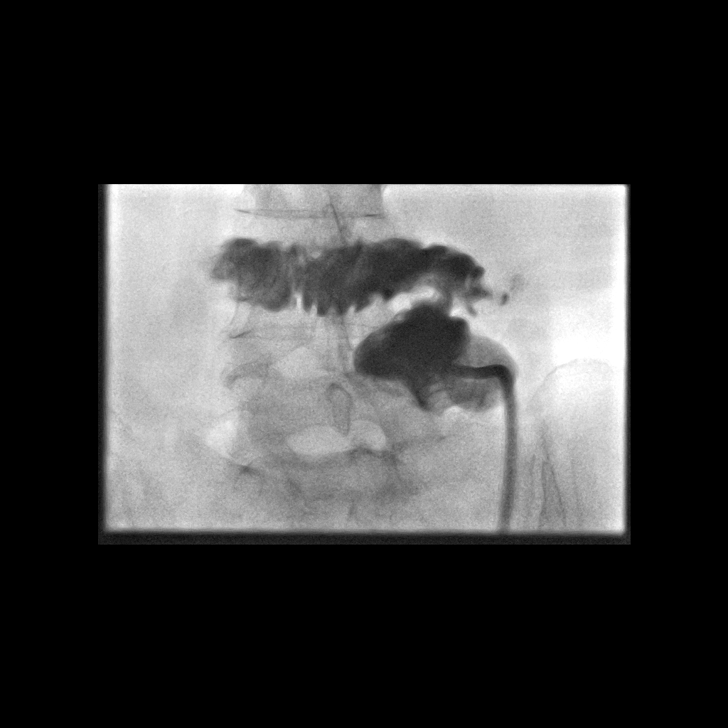
[im 3/8]
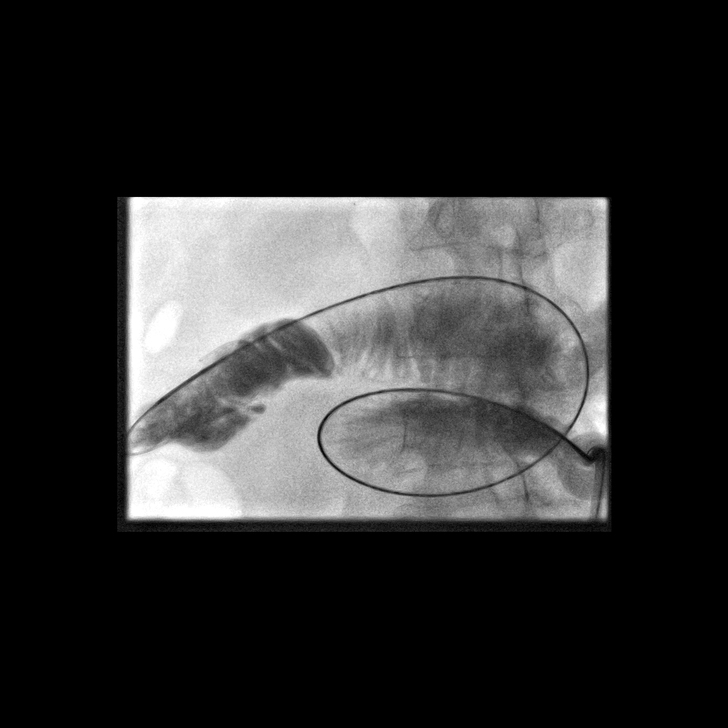
[im 4/8]
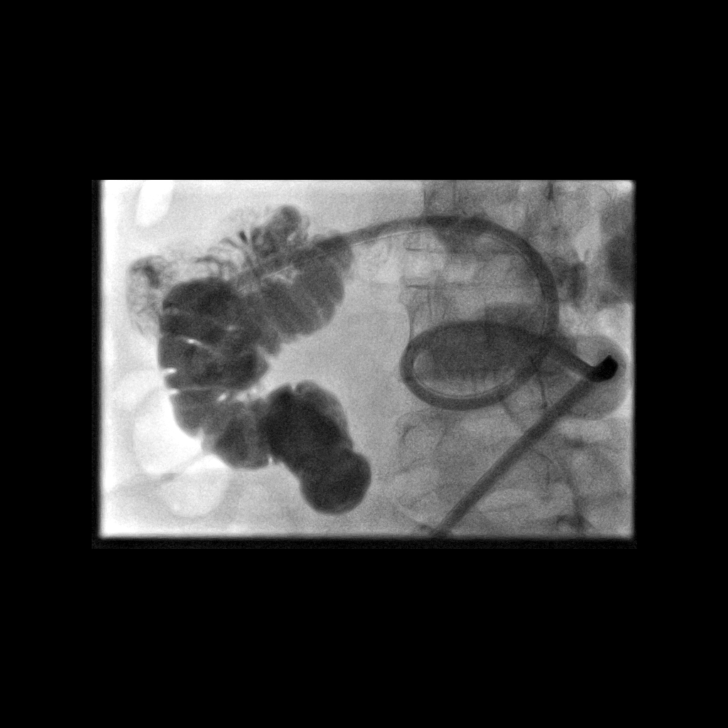
[im 5/8]
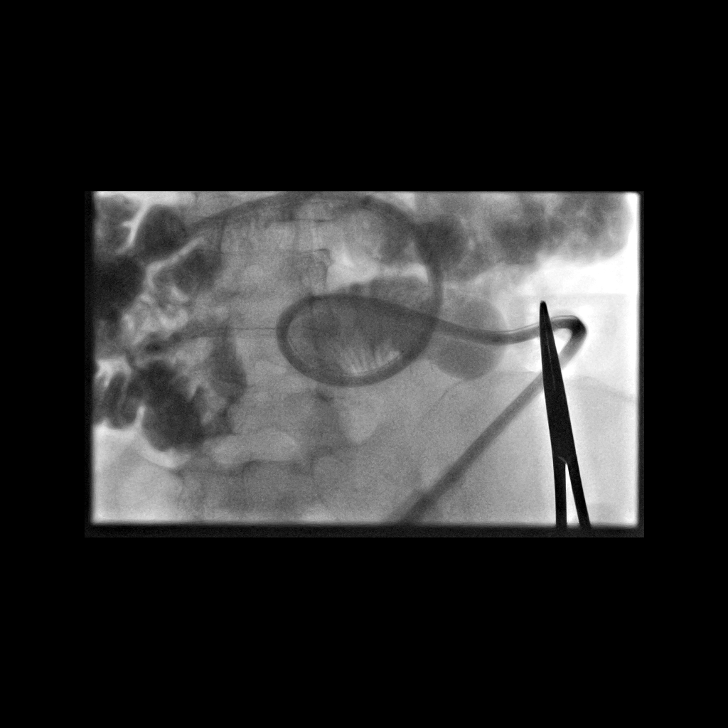
[im 6/8]
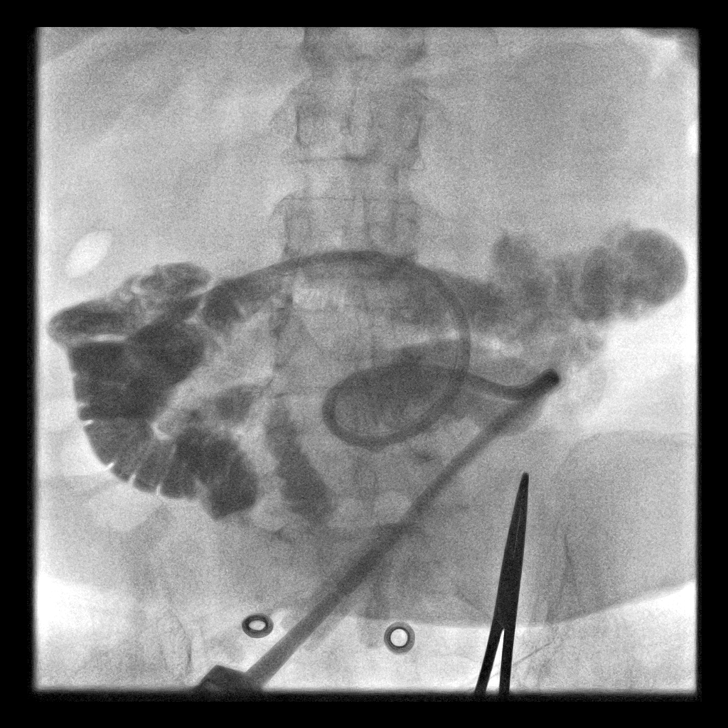
[im 7/8]
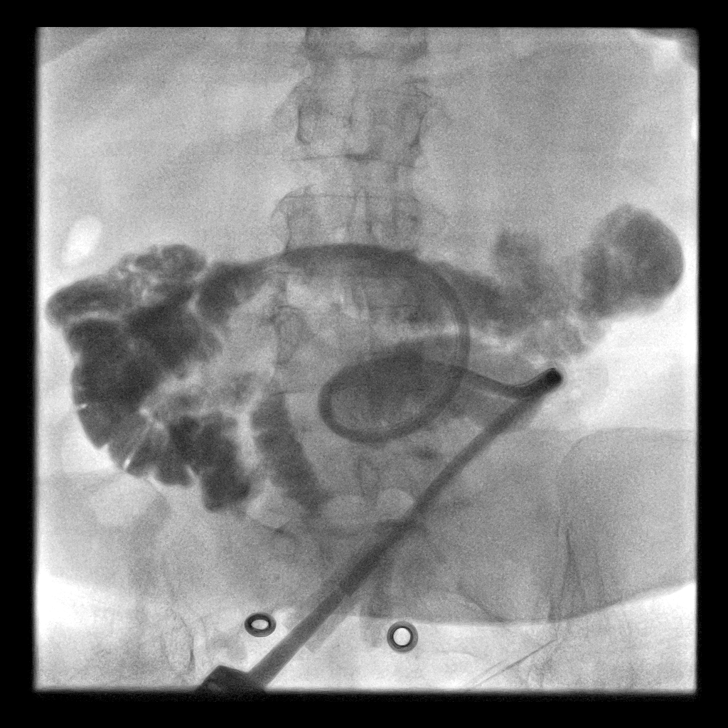
[im 8/8]
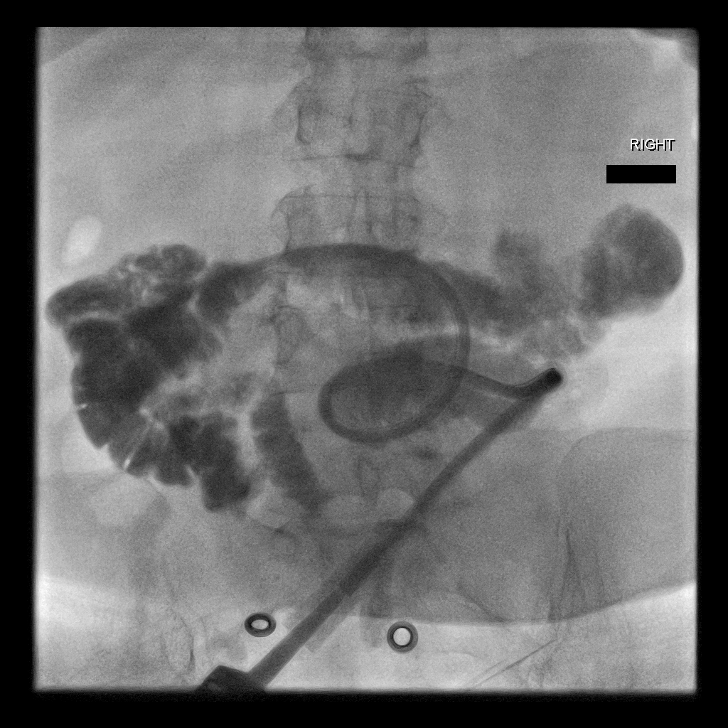

[8 of 8 positions shown; findings below may reference images not displayed]

12/31/2011; 07/16/2011; 12/30/2010; abdominal CT -
04/09/2011

Medications: None

Contrast: 35 ml 3mnipaque-I22, administered enterically

Fluoroscopy Time: 7.6 minutes.

Complications: None immediate

Technique / Findings:

Informed written consent was obtained from the patient after a
discussion of the risks, benefits and alternatives to treatment.
Questions regarding the procedure were encouraged and answered.  A
timeout was performed prior to the initiation of the procedure.

The site of prior jejunostomy tube was prepped and draped in the
usual sterile fashion, and a sterile drape was applied covering the
operative field.  Maximum barrier sterile technique with sterile
gowns and gloves were used for the procedure.  A timeout was
performed prior to the initiation of the procedure.

Contrast injection of the existing jejunostomy tube was performed
with a catheter tip syringe, now with saline of the mid jejunum.

The existing jejunostomy tube was cannulated with a Kumpe catheter.
With the use of a stiff Glidewire, the Kumpe catheter was advanced
into the mid aspect of the jejunum.  Contrast injection confirmed
appropriate positioning.

Under intermittent fluoroscopic guidance, the catheter was
exchanged for a new 20-French jejunostomy tube (cut at about [DATE]
distance) over an Amplatz stiff guide wire.  The retention balloon
was inflated and the balloon was cinched.  Contrast was injected
and several spot fluoroscopic images were obtained in various
obliquities confirming intraluminal postioning.  The patient
tolerated the procedure well without immediate postprocedural
complication.
IMPRESSION: Successful fluoroscopic guided replacement, upsize and lengthening
of a new 20-French jejunostomy tube.  The new jejunostomy tube is
ready for immediate use.

## 2013-07-11 ENCOUNTER — Ambulatory Visit: Payer: Medicare HMO | Admitting: Rehabilitation

## 2013-07-11 DIAGNOSIS — IMO0001 Reserved for inherently not codable concepts without codable children: Secondary | ICD-10-CM | POA: Diagnosis not present

## 2013-07-12 ENCOUNTER — Ambulatory Visit
Admission: RE | Admit: 2013-07-12 | Discharge: 2013-07-12 | Disposition: A | Discharge status: Home / Self Care | Payer: Medicaid Other | Source: Ambulatory Visit | Attending: Neurology | Admitting: Neurology

## 2013-07-12 DIAGNOSIS — I679 Cerebrovascular disease, unspecified: Secondary | ICD-10-CM

## 2013-07-13 ENCOUNTER — Telehealth: Payer: Self-pay | Admitting: Neurology

## 2013-07-13 NOTE — Telephone Encounter (Signed)
I called patient. 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 The patient is to remain on low-dose aspirin.  MRI brain 07/12/2013:  Impression   Abnormal MRI brain (without) demonstrating: 1. Mild periventricular and subcortical and right perisylvian chronic  small vessel ischemic disease. Area in question in the right perisylvian  region on prior CT, is visualized at chronic ischemic gliosis in the  current study. 2. No acute findings.   2-D echocardiogram 06/30/2013:   Study Conclusions  - Left ventricle: The cavity size was normal. Wall thickness was normal. Systolic function was normal. The estimated ejection fraction was in the range of 55% to 60%. - Atrial septum: No defect or patent foramen ovale was identified. - Impressions: Grade 2 diastolic dysfunction with elevated EDP by E/E' ratio Impressions:  - Grade 2 diastolic dysfunction with elevated EDP by E/E' ratio   Carotid Doppler study 06/30/2013:   Summary:  - The vertebral arteries appear patent with antegrade flow. - Findings consistent with 1-39 percent stenosis involving the right internal carotid artery and the left internal carotid artery.

## 2013-07-19 ENCOUNTER — Ambulatory Visit: Payer: Medicare HMO | Admitting: Physical Therapy

## 2013-07-19 DIAGNOSIS — IMO0001 Reserved for inherently not codable concepts without codable children: Secondary | ICD-10-CM | POA: Diagnosis not present

## 2013-07-26 ENCOUNTER — Ambulatory Visit: Payer: Medicare HMO | Admitting: Physical Therapy

## 2013-07-26 DIAGNOSIS — IMO0001 Reserved for inherently not codable concepts without codable children: Secondary | ICD-10-CM | POA: Diagnosis not present

## 2013-07-27 ENCOUNTER — Other Ambulatory Visit: Payer: Self-pay | Admitting: *Deleted

## 2013-07-27 MED ORDER — PROMETHAZINE HCL 25 MG PO TABS: ORAL_TABLET | ORAL | Status: DC

## 2013-07-31 ENCOUNTER — Ambulatory Visit: Payer: Medicare HMO | Admitting: Rehabilitation

## 2013-07-31 DIAGNOSIS — IMO0001 Reserved for inherently not codable concepts without codable children: Secondary | ICD-10-CM | POA: Diagnosis not present

## 2013-08-02 ENCOUNTER — Ambulatory Visit: Payer: Medicare Other | Attending: Internal Medicine | Admitting: Rehabilitation

## 2013-08-02 DIAGNOSIS — M6281 Muscle weakness (generalized): Secondary | ICD-10-CM | POA: Insufficient documentation

## 2013-08-02 DIAGNOSIS — R5381 Other malaise: Secondary | ICD-10-CM | POA: Insufficient documentation

## 2013-08-02 DIAGNOSIS — IMO0001 Reserved for inherently not codable concepts without codable children: Secondary | ICD-10-CM | POA: Diagnosis present

## 2013-08-07 ENCOUNTER — Ambulatory Visit: Payer: Medicare Other | Admitting: Rehabilitation

## 2013-08-07 DIAGNOSIS — IMO0001 Reserved for inherently not codable concepts without codable children: Secondary | ICD-10-CM | POA: Diagnosis not present

## 2013-08-29 ENCOUNTER — Other Ambulatory Visit: Payer: Self-pay | Admitting: *Deleted

## 2013-08-29 MED ORDER — ESOMEPRAZOLE MAGNESIUM 40 MG PO CPDR: 40.0000 mg | DELAYED_RELEASE_CAPSULE | Freq: Every day | ORAL | Status: DC

## 2013-09-04 ENCOUNTER — Telehealth: Payer: Self-pay | Admitting: Internal Medicine

## 2013-09-04 MED ORDER — PANTOPRAZOLE SODIUM 40 MG PO TBEC: 40.0000 mg | DELAYED_RELEASE_TABLET | Freq: Every day | ORAL | Status: DC

## 2013-09-04 NOTE — Telephone Encounter (Signed)
Patient states she was given generic Nexium and she has noticed dry mouth since this. She is asking if the generic could contribute to this. Discussed other medications she takes that could cause this but she states she noticed the change after getting a "green pill instead of the purple pill." She has also noticed that it feels like her food stops in the center of her chest when she eats. Please, advise.

## 2013-09-04 NOTE — Addendum Note (Signed)
Addended by: Hulan Saas on: 09/04/2013 12:59 PM   Modules accepted: Orders

## 2013-09-04 NOTE — Telephone Encounter (Signed)
Last UGI 12/2012 showed no stricture, I would first consider changing her PPI. If she is Medicaid would they cover Protonix 40 mg po qd,#30, 1 refill. Let me know.

## 2013-09-04 NOTE — Telephone Encounter (Signed)
Patient given recommendation. Rx sent to pharmacy.

## 2013-09-06 ENCOUNTER — Telehealth: Payer: Self-pay | Admitting: Internal Medicine

## 2013-09-06 NOTE — Telephone Encounter (Signed)
Called [facility] to initiate PA for Phenergan. Waiting on faxed form to sent to to office.

## 2013-09-07 ENCOUNTER — Telehealth: Payer: Self-pay | Admitting: Internal Medicine

## 2013-09-07 MED ORDER — ONDANSETRON HCL 8 MG PO TABS: 8.0000 mg | ORAL_TABLET | Freq: Three times a day (TID) | ORAL | Status: DC | PRN

## 2013-09-07 NOTE — Telephone Encounter (Signed)
Patient advised of Dr Brodie's recommendations. She verbalizes understanding. 

## 2013-09-07 NOTE — Telephone Encounter (Signed)
Patient states that she was taken off of Reglan due to "stroke symptoms" and is not on any other antiemetic with the exception of the promethazine which is now not approved. My question is do you want her to try zofran or something else or do you want her to be on NO antiemetics.

## 2013-09-07 NOTE — Telephone Encounter (Signed)
Form filled out and faxed to [facility].

## 2013-09-07 NOTE — Telephone Encounter (Signed)
You can try Zofran 8 mg, #30, 1 po q8 hrs prn. 1 refill.

## 2013-09-07 NOTE — Telephone Encounter (Signed)
I cannot do anything about it.

## 2013-09-07 NOTE — Telephone Encounter (Signed)
Dr Olevia Perches, patient's insurance has denied her promethazine. Please advise.

## 2013-09-12 ENCOUNTER — Telehealth: Payer: Self-pay | Admitting: Internal Medicine

## 2013-09-12 NOTE — Telephone Encounter (Signed)
Patient states that she has an appeal form that she would like [place] to fill out for phenergan. She will bring it to our office.

## 2013-09-13 ENCOUNTER — Other Ambulatory Visit (HOSPITAL_COMMUNITY): Payer: Self-pay | Admitting: Internal Medicine

## 2013-09-13 ENCOUNTER — Ambulatory Visit (HOSPITAL_COMMUNITY)
Admission: RE | Admit: 2013-09-13 | Discharge: 2013-09-13 | Disposition: A | Discharge status: Home / Self Care | Payer: Medicare Other | Source: Ambulatory Visit | Attending: Internal Medicine | Admitting: Internal Medicine

## 2013-09-13 DIAGNOSIS — M545 Low back pain, unspecified: Secondary | ICD-10-CM | POA: Insufficient documentation

## 2013-09-15 ENCOUNTER — Encounter: Payer: Self-pay | Admitting: *Deleted

## 2013-09-15 ENCOUNTER — Telehealth: Payer: Self-pay | Admitting: Internal Medicine

## 2013-09-15 NOTE — Telephone Encounter (Signed)
Provided diagnoses for insurance company.

## 2013-09-18 ENCOUNTER — Telehealth: Payer: Self-pay | Admitting: Internal Medicine

## 2013-09-18 NOTE — Telephone Encounter (Signed)
I am sorry, I meant 60 mg

## 2013-09-18 NOTE — Telephone Encounter (Signed)
Spoke with patient and she states she has been taking the Pantoprazole. She still is having the feeling when she eats that the esophagus is closing up. Also, reports trouble swallowing pills. Please, advise.

## 2013-09-18 NOTE — Telephone Encounter (Signed)
Dexilant comes in 30 or 60 mg. Please, advise which dose for patient.

## 2013-09-18 NOTE — Telephone Encounter (Signed)
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 Last EGD 12/2012- no stricture, Barium study showed no obstruction, reflux present. See if her insurance would cover Dexilant 50 mg, 1 po qd if  Not, or, if no improvement in few weeks, will recommend EGD.

## 2013-09-19 MED ORDER — DEXLANSOPRAZOLE 60 MG PO CPDR: 60.0000 mg | DELAYED_RELEASE_CAPSULE | Freq: Every day | ORAL | Status: DC

## 2013-09-19 NOTE — Telephone Encounter (Signed)
Spoke with patient and gave her recommendations. Rx sent to pharmacy. Patient will let me know if insurance denies it.

## 2013-09-22 NOTE — Telephone Encounter (Signed)
Per Ramiro Harvest at [employer], appeal was denied on 09/17/13 (reasoning for appeals was as follows: gastroparesis, failed nissen fundoplication x 2, belsey gastropexy for gastric outlet obstruction. Patient with feeding difficulties and constant severe nausea only relieved with promethazine. Also has severe GERD on max therapy. Patient just recently able to have [employer]. Previously on tube feedings... Dx codes 536.3, 783.3, 537.0, 530.81).

## 2013-10-05 ENCOUNTER — Telehealth: Payer: Self-pay | Admitting: Internal Medicine

## 2013-10-05 MED ORDER — MAGIC MOUTHWASH: ORAL | Status: DC

## 2013-10-05 NOTE — Telephone Encounter (Signed)
Spoke with pharmacist and confirmed that pt is supposed to have magic mouthwash.

## 2013-10-05 NOTE — Telephone Encounter (Signed)
Rx sent to pharmacy. Patient notified. 

## 2013-10-05 NOTE — Telephone Encounter (Signed)
Majic mouthwash swish and swallow tid, 8 oz,, 1 refill

## 2013-10-05 NOTE — Telephone Encounter (Signed)
Patient went see PCP( Dr. Betsy Coder). She has kidney stone and kidney infection. She is on Nitrofurantoin x 7 days. She also reports she is being referred to urology. Patient reports she is having stomach pain but is not sure if it is GI or due to kidney stones. Also, reports left side of neck is sore when she touches it. She does have problems swallowing when she takes multiple pills together. Discussed with patient the need to take each pill separately. She is also having constipation which is unusual for her. She is not taking Lomotil. She will try 1/2 dose of Miralax. Please, advise of any other suggestions.

## 2013-10-20 ENCOUNTER — Telehealth: Payer: Self-pay | Admitting: Internal Medicine

## 2013-10-20 MED ORDER — SUCRALFATE 1 GM/10ML PO SUSP: ORAL | Status: DC

## 2013-10-20 MED ORDER — PROMETHAZINE HCL 25 MG PO TABS: ORAL_TABLET | ORAL | Status: DC

## 2013-10-20 NOTE — Telephone Encounter (Signed)
Phenergan generic 25mg , #10, she may have to pay for it. No refill, 1 po q4-6 hrs prn nauses, continue PPI, doe she still have Carafate 10cc po bid.

## 2013-10-20 NOTE — Telephone Encounter (Signed)
Patient calling to report for the last 2-3 days she has had nausea. She is taking Zofran every 8 hours without relief. No vomiting. Her insurance has denied our appeal for Promethazine. She also reports she occasionally has a choking sensation. Please, advise.

## 2013-10-20 NOTE — Telephone Encounter (Signed)
Rx sent to pharmacy. Left a message for patient to call me. 

## 2013-10-20 NOTE — Telephone Encounter (Signed)
Spoke with patient and gave her recommendations. Rx sent for Phenergan and Carafate.

## 2013-10-30 ENCOUNTER — Telehealth: Payer: Self-pay | Admitting: Internal Medicine

## 2013-10-30 NOTE — Telephone Encounter (Signed)
Patient states that she received a letter from [facility] stating that they would provide her with a 31 day supply of promethazine if her Dr office told them that she needed it and had failed other medications. I explained that her promethazine had already been denied and we had previously sent in a redetermination request which we have not heard back from. Patient is adamant that insuarnce will allow the medication. I have contacted Sharyn Lull at [facility] [phone] who states that she cannot find any evidence of a letter sent to patient, nor can she find the redetermination request that we faxed on 09/14/13. I have refaxed the redetermination request and have advised patient to contact member services (as suggested by Sharyn Lull) to find out why she would be getting a letter if she was already denied for promethazine previously. She verbalizes understanding.

## 2013-10-31 ENCOUNTER — Telehealth: Payer: Self-pay | Admitting: Internal Medicine

## 2013-10-31 IMAGING — CR DG ABDOMEN ACUTE W/ 1V CHEST
3 series · 3 of 3 positions shown · non-contrast
Comparison: Chest x-ray of 10/25/2011 and abdomen films of
04/13/2012

CLINICAL DATA: Nausea, vomiting, diarrhea, possible obstruction,
prior gastrectomy.

ACUTE ABDOMEN SERIES (ABDOMEN 2 VIEW & CHEST 1 VIEW)

[w chest pa]
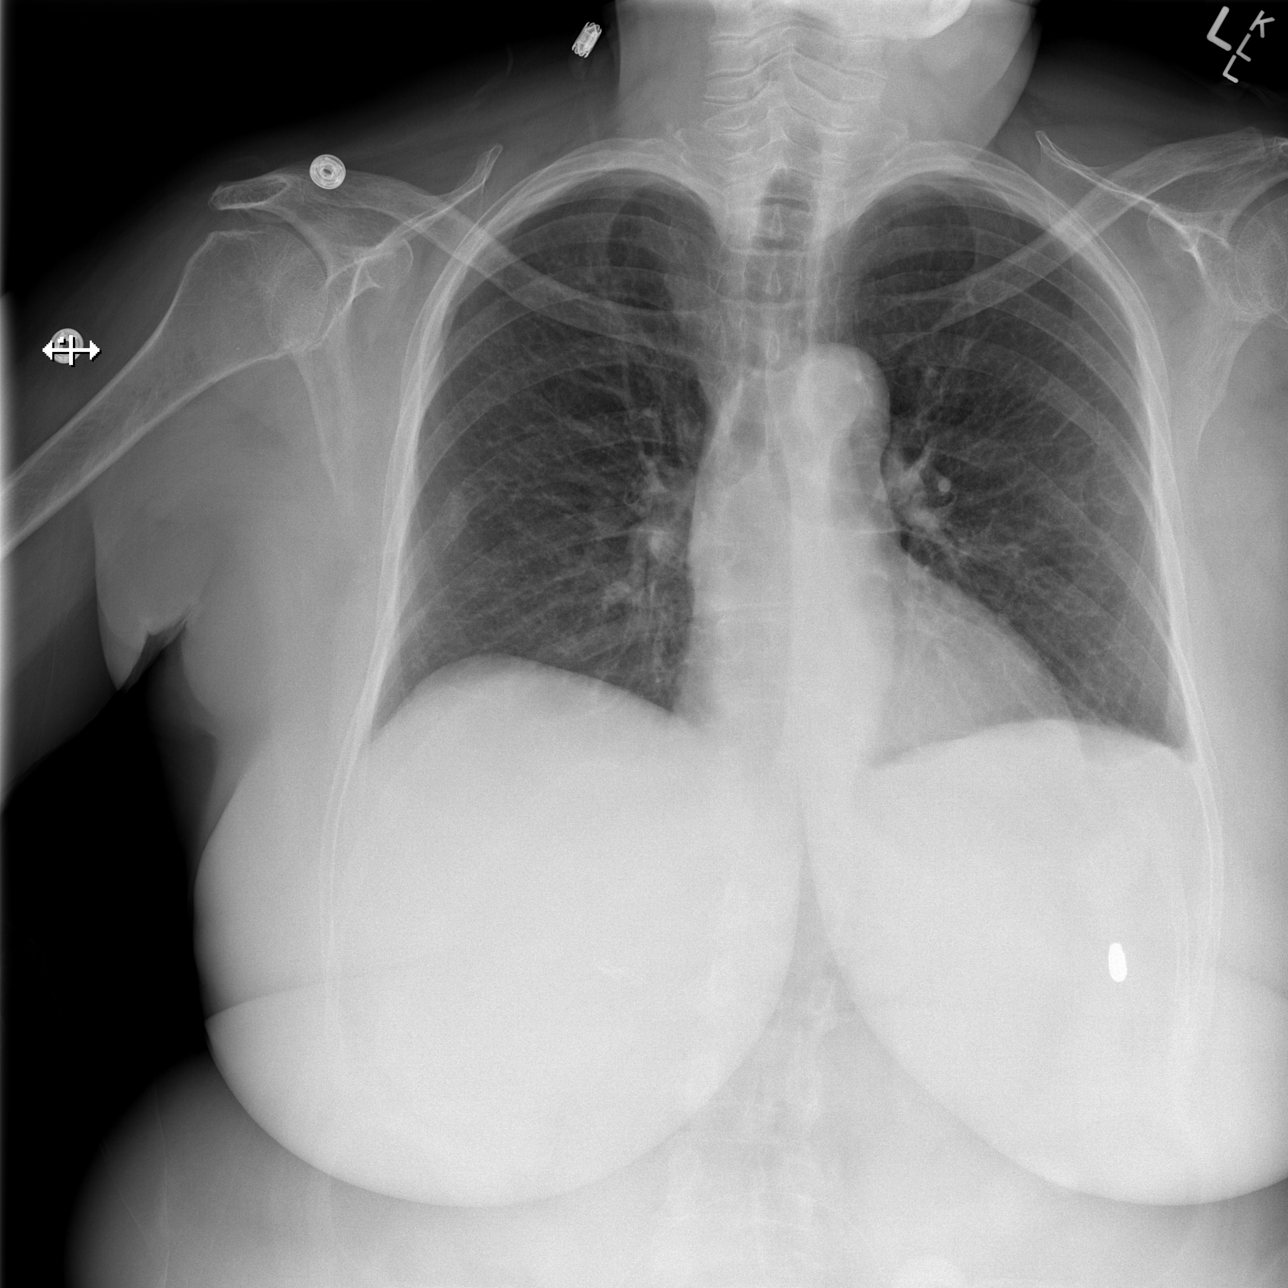

[w abdomen upright]
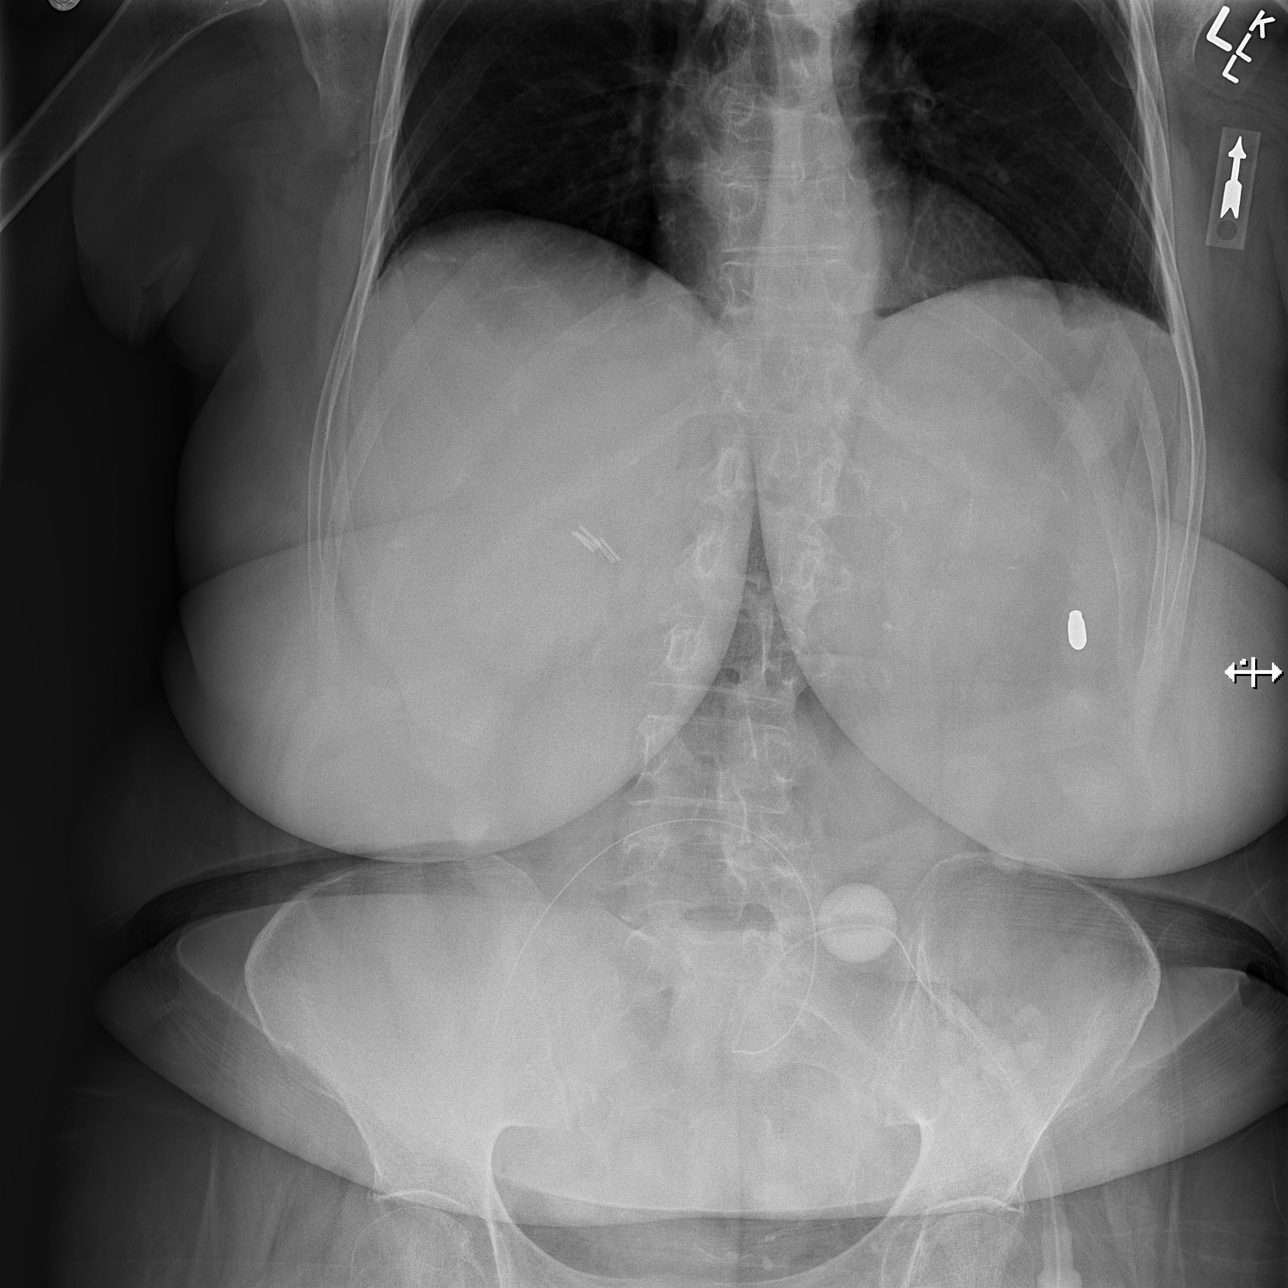

[t abdomen supine]
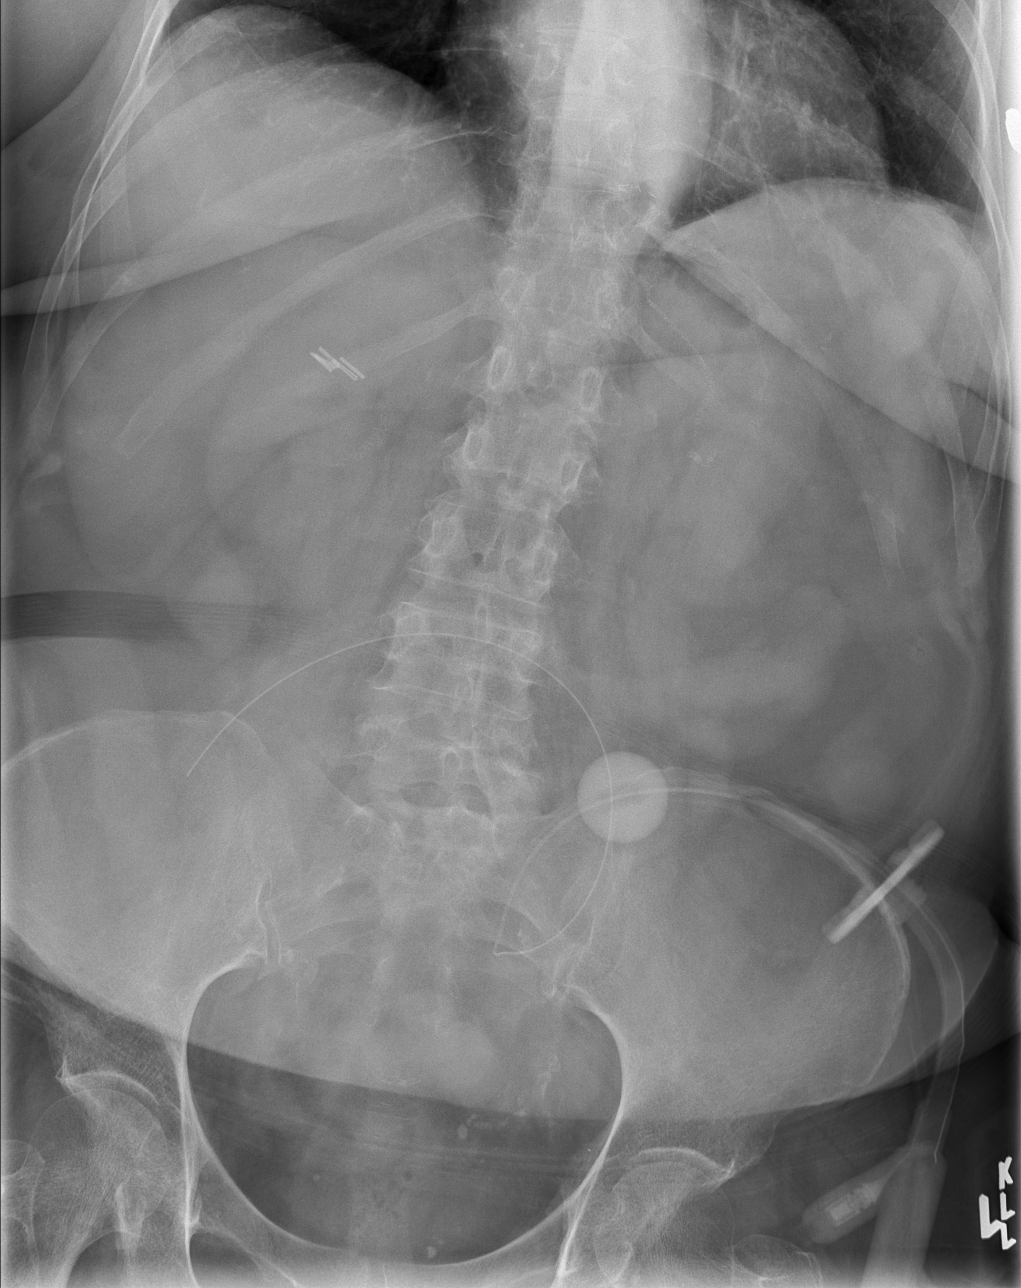

[3 of 3 positions shown; findings below may reference images not displayed]

FINDINGS: No active infiltrate or effusion is seen.  Mediastinal
contours are stable.  The heart is within upper limits of normal.
An old healed right posterior eighth rib fracture is noted.

Supine and erect views of the abdomen show a paucity of bowel gas
throughout the abdomen.  No air-fluid level is seen to indicate
obstruction.  Fluid-filled bowel cannot be excluded however. A
gastrojejunostomy feeding tube is present.
IMPRESSION: 1.  No active lung disease.
2.  Paucity of bowel gas.  No definite obstruction.  Difficult to
exclude fluid-filled bowel.

## 2013-10-31 IMAGING — CT CT ABD-PELV W/ CM
1 of 3 series · 13 of 32 positions shown, 18 images · IV contrast (omnipaque)
Comparison: 04/09/2011

CLINICAL DATA: Nausea, vomiting, and chills for 1 week.  Lower
abdominal pain.

CT ABDOMEN AND PELVIS WITH CONTRAST
TECHNIQUE: Multidetector CT imaging of the abdomen and pelvis was
performed following the standard protocol during bolus
administration of intravenous contrast.
Contrast: 100mL OMNIPAQUE IOHEXOL 300 MG/ML  SOLN 100 ml Omnipaque-
300

[Series 2: abd/pel with · axial · 0.83mm/px · z∈[-350,+5]mm · 13 of 81 slices shown, 18 images]
[im 5/81  soft-tissue]
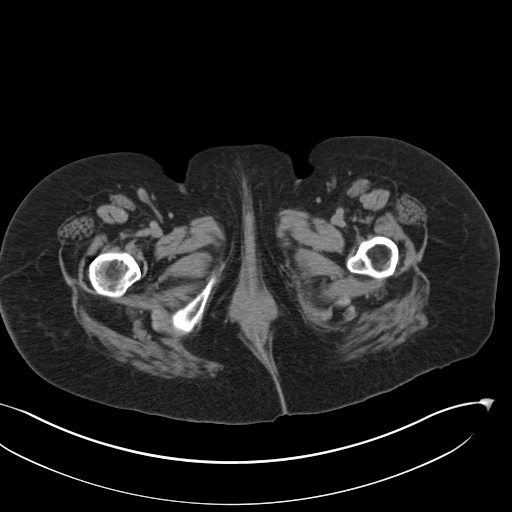
[im 5/81  bone]
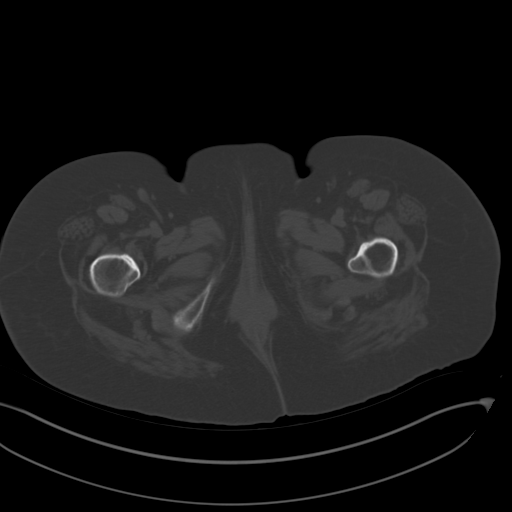
[im 14/81  soft-tissue]
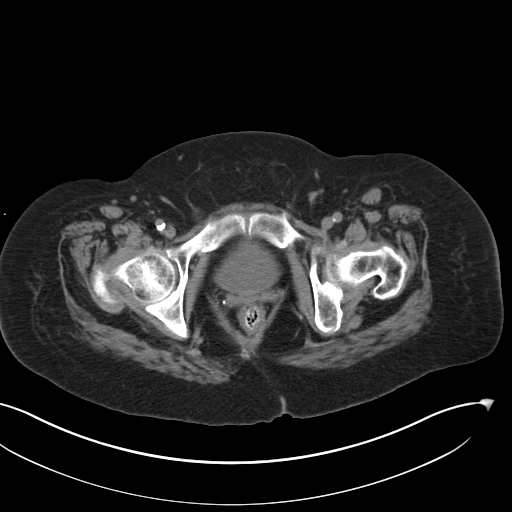
[im 18/81  soft-tissue]
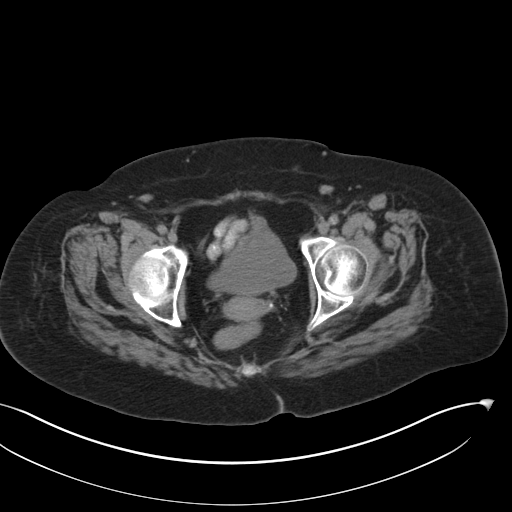
[im 23/81  soft-tissue]
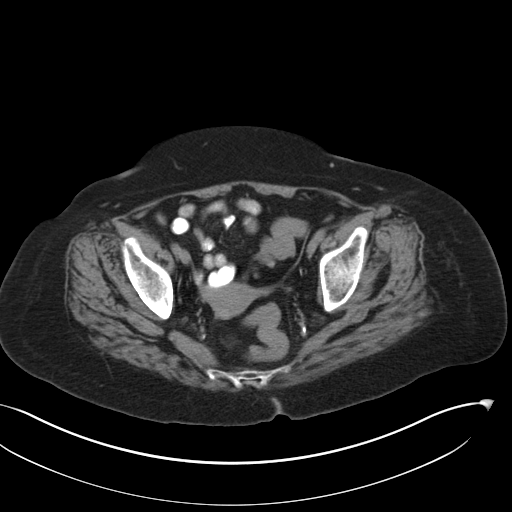
[im 32/81  soft-tissue]
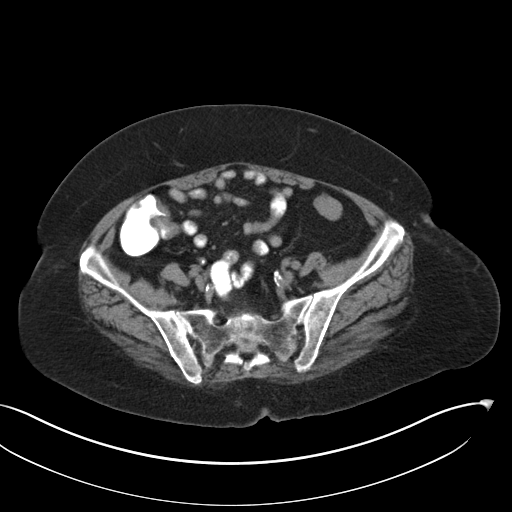
[im 36/81  soft-tissue]
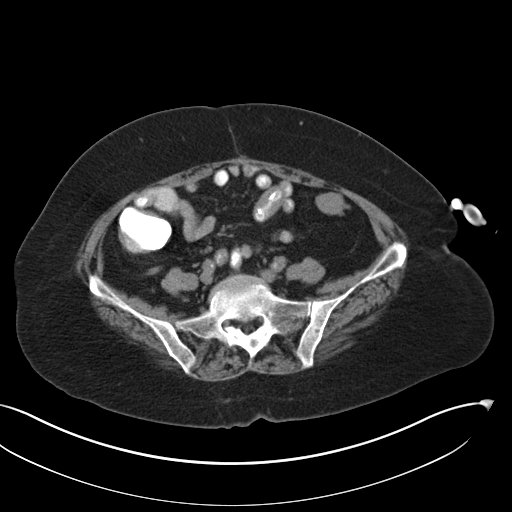
[im 45/81  soft-tissue]
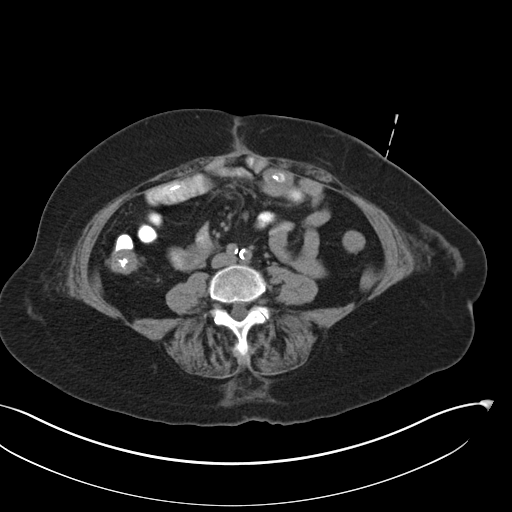
[im 49/81  soft-tissue]
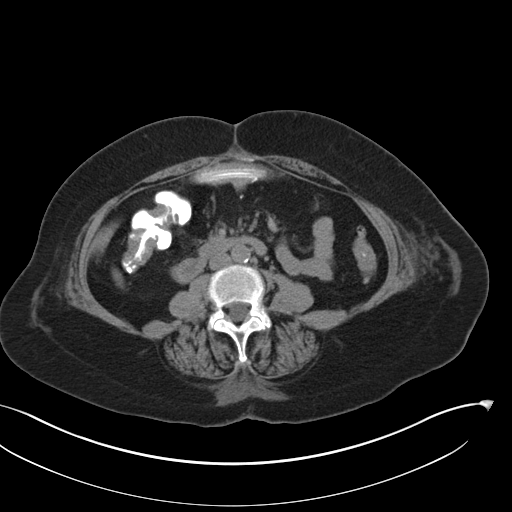
[im 58/81  soft-tissue]
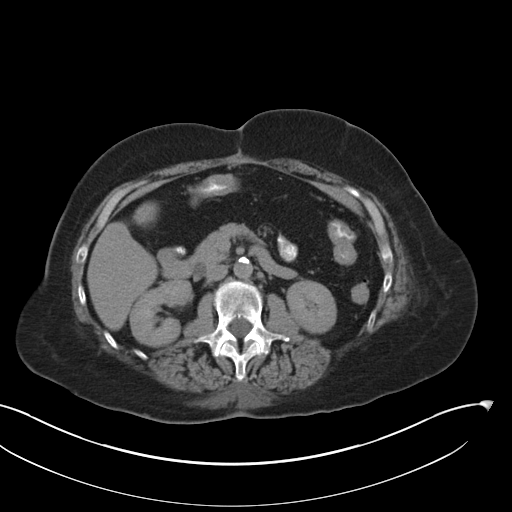
[im 58/81  bone]
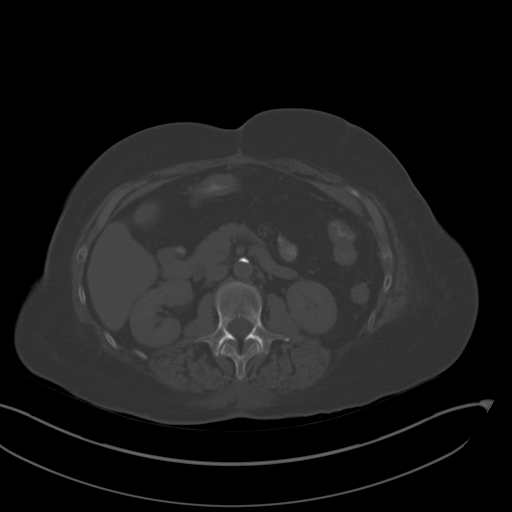
[im 63/81  soft-tissue]
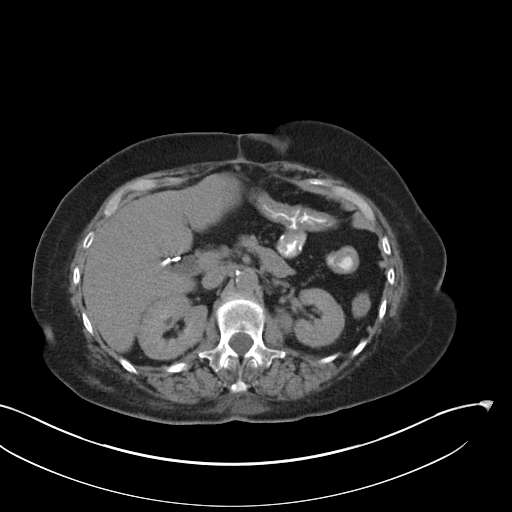
[im 63/81  lung]
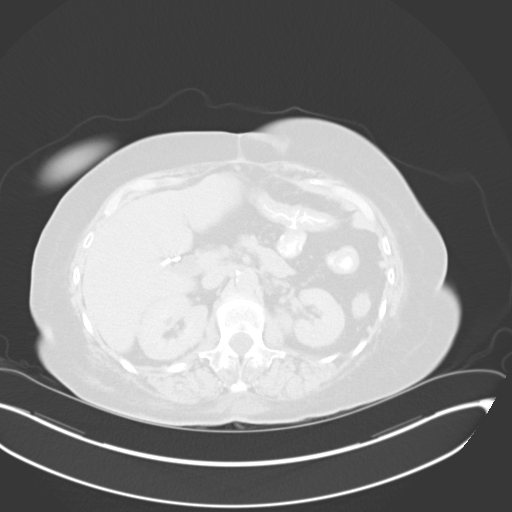
[im 67/81  soft-tissue]
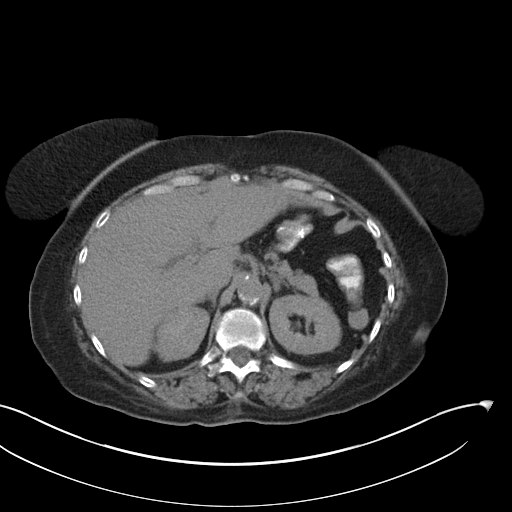
[im 67/81  lung]
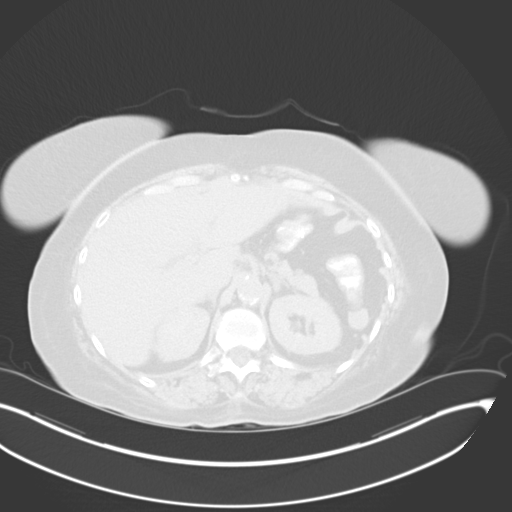
[im 72/81  lung]
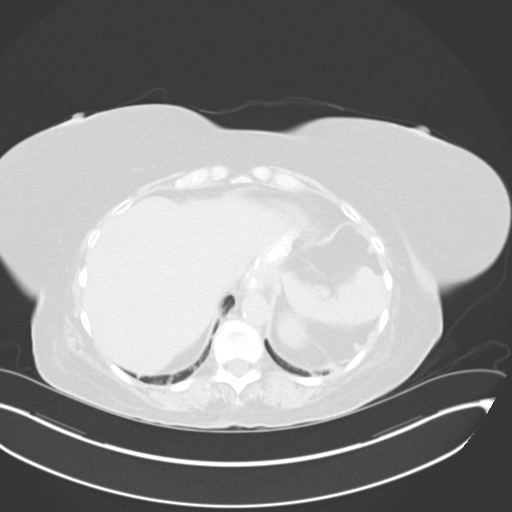
[im 76/81  soft-tissue]
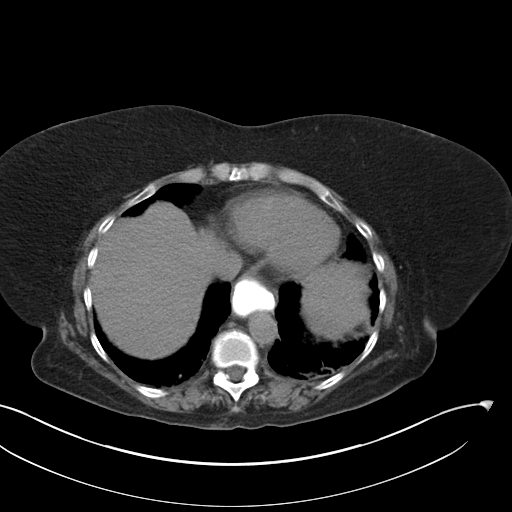
[im 76/81  lung]
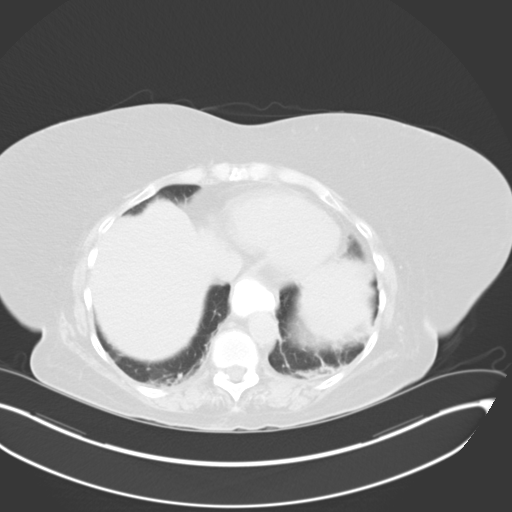

[13 of 32 positions shown; findings below may reference images not displayed]

FINDINGS: Due to difficulties with contrast injection, there is
limited contrast opacification of solid organs.

Fibrosis and atelectasis in the lung bases.  Dilated and contrast
filled esophagus.  This could represent dysmotility or distal
esophageal obstruction.  Dilatation has increased since the
previous study.

Surgical absence of the gallbladder. Mild bile duct dilatation,
likely due to postoperative physiology.  Postoperative changes
consistent with gastrectomy.  Left lower quadrant jejunostomy tube.
The colon is decompressed, limiting evaluation, but there appears
to be thickening of the wall of the transverse colon and cecum
which could represent colitis of nonspecific etiology.  There is a
small amount of small bowel wall thickening at the jejunostomy tube
insertion.  There is no evidence of free air or free fluid in the
abdomen to suggest perforation.  Postsurgical changes in the
anterior abdominal wall.  No small bowel dilatation.

The liver, spleen, adrenal glands, and retroperitoneal lymph nodes
are unremarkable.  Sub centimeter low attenuation changes in the
kidneys suggesting parenchymal cysts.  These were demonstrated on a
previous study without significant change.  Calcification of the
aorta without aneurysm.

Pelvis:  The uterus and ovaries are not enlarged.  The bladder wall
is not thickened.  No free or loculated pelvic fluid collections.
The appendix is normal.  No diverticulitis.  Scattered diverticula
in the colon.  There is infiltration in the subcutaneous fat over
the left lower flank region, likely representing contusion.  No
significant lymphadenopathy in the pelvis.  Normal alignment of the
thoracic vertebrae.
IMPRESSION: There appears to be colonic wall thickening although non distension
of the colon limits evaluation.  Suggest colitis.  Focal wall
thickening in the jejunum at the insertion of jejunostomy catheter
of nonspecific etiology but likely postoperative.  No evidence of
bowel obstruction.  The esophagus is distended, suggesting
dysmotility and/or distal obstruction.

## 2013-10-31 NOTE — Telephone Encounter (Signed)
Spoke to [employer]. Staff member explained that first a denial from insurance must be given, then an appeal for the denial must receive a denial as well. Emily [name] is a 2nd step appeal so they would deal with an appeal after all of that has been completed. At this time, we have gotten a denial and have sent an appeal out. We are awaiting that response prior to dealing with [place].

## 2013-11-05 IMAGING — XA IR REPLC DUODEN/JEJUNO TUBE PERCUT W/FLUORO
1 series · 5 of 5 positions shown · non-contrast
Comparison: Fluoroscopic-guided jejunostomy tube exchange -
02/15/2012;

INDICATION: Leaking jejunostomy tube

FLUROSCOPIC GUIDED REPLACEMENT OF JEJUNOSTOMY TUBE

[Series 300: ir radiologist eval & mgmt · 5 of 5 slices shown]
[im 1/5]
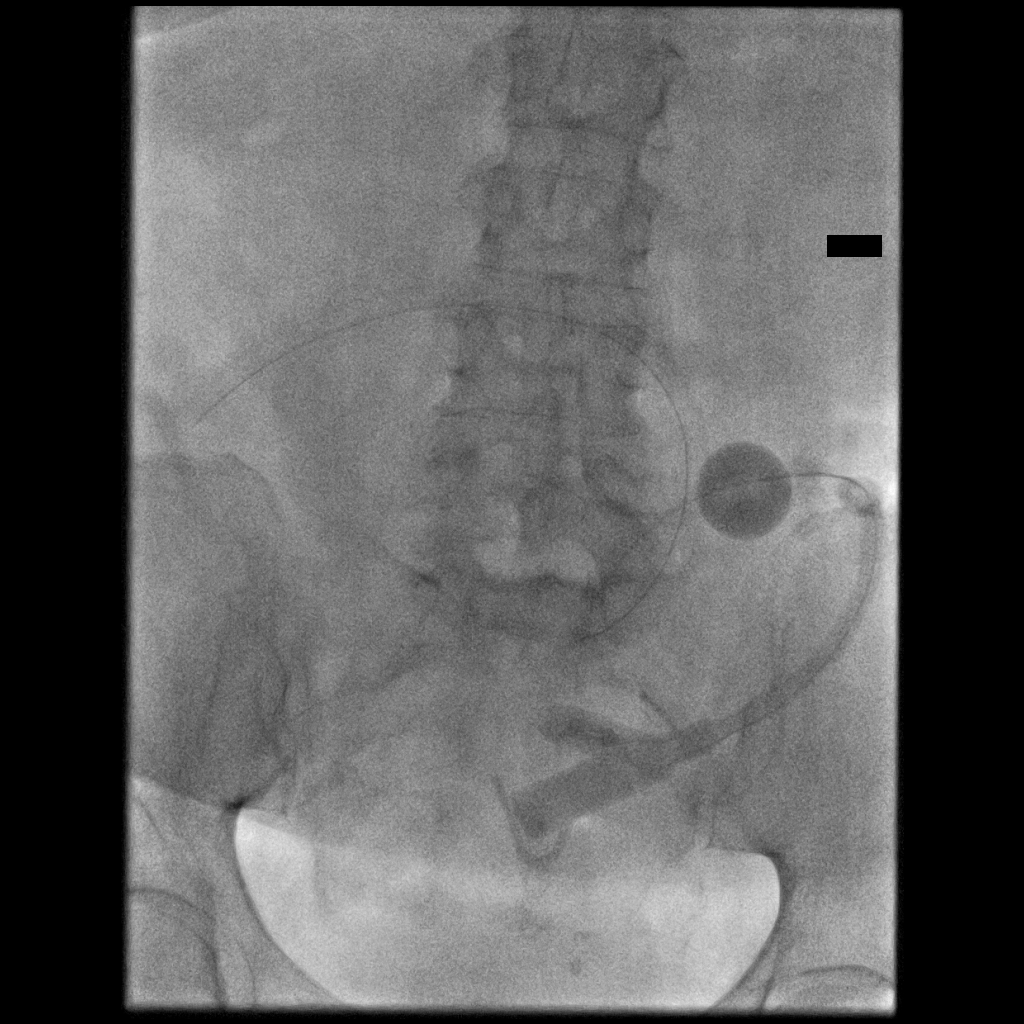
[im 2/5]
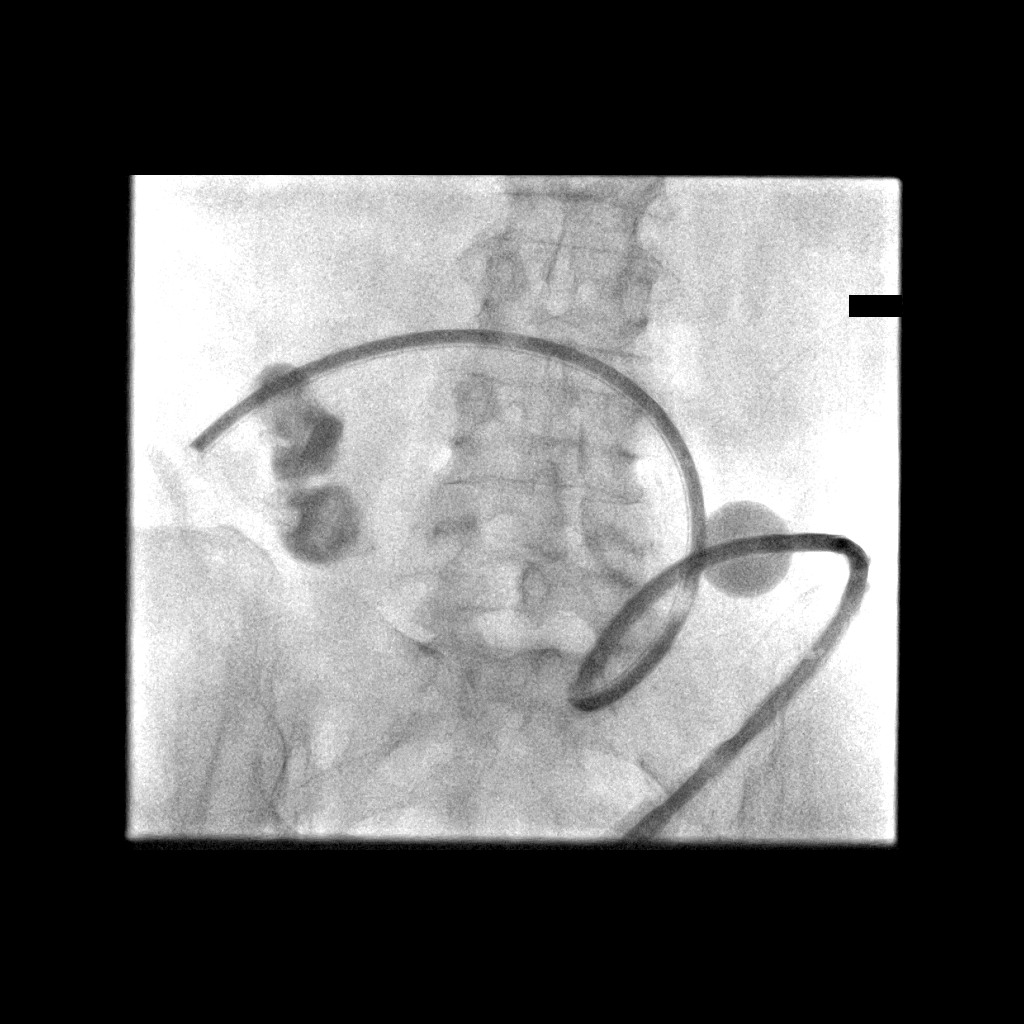
[im 3/5]
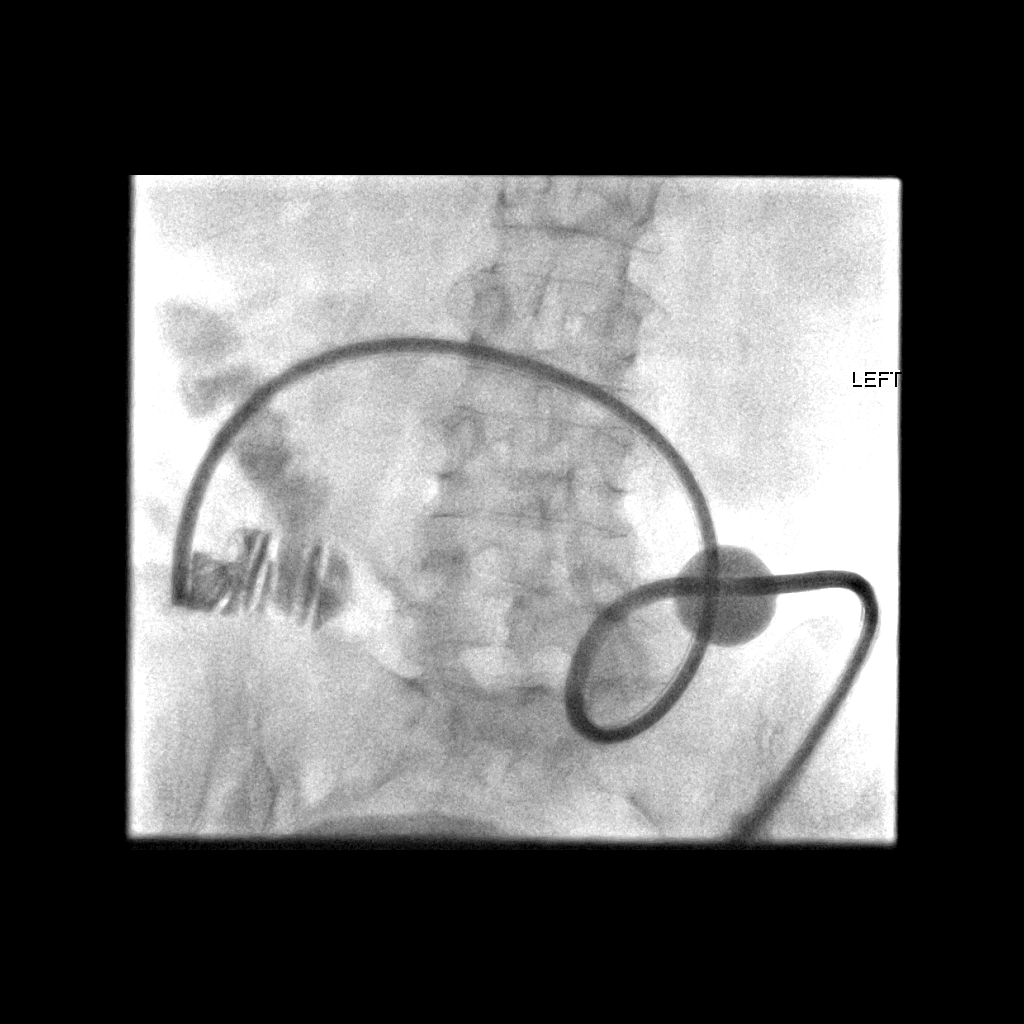
[im 4/5]
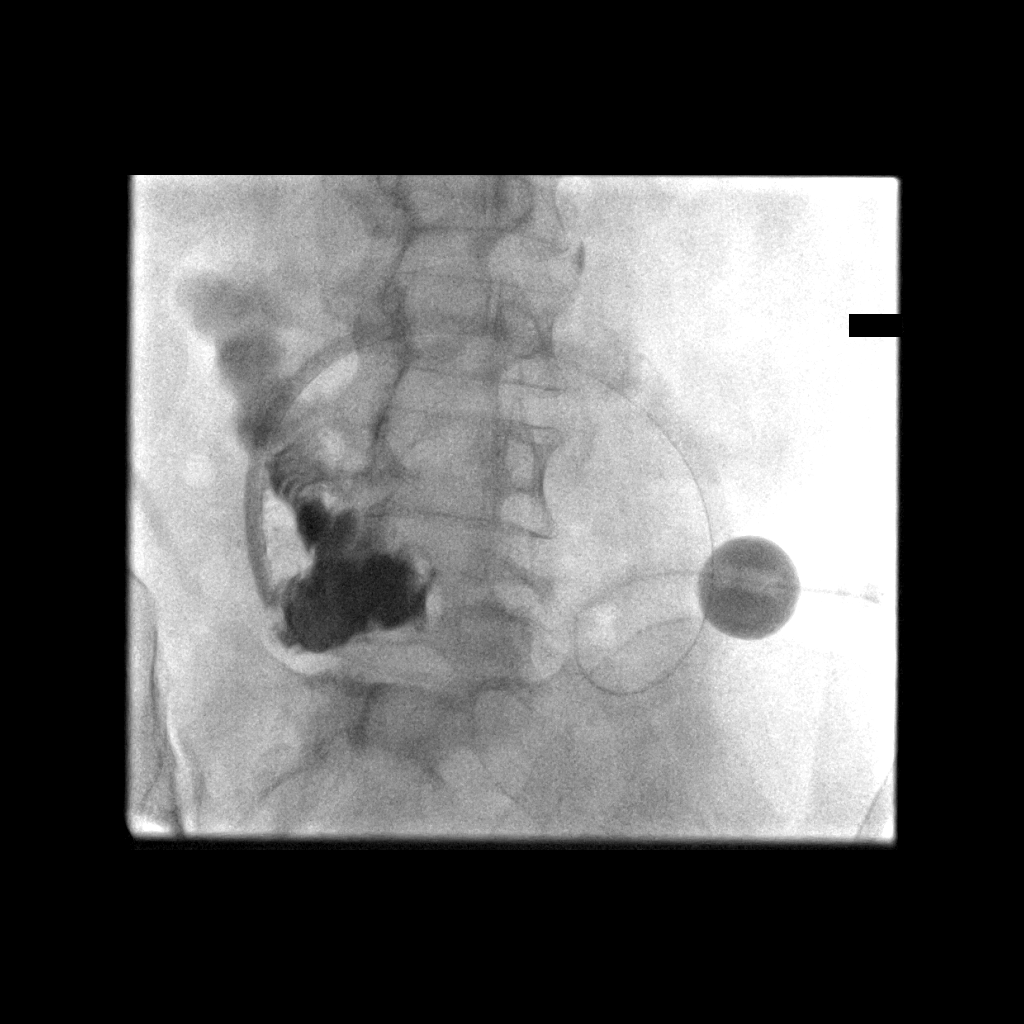
[im 5/5]
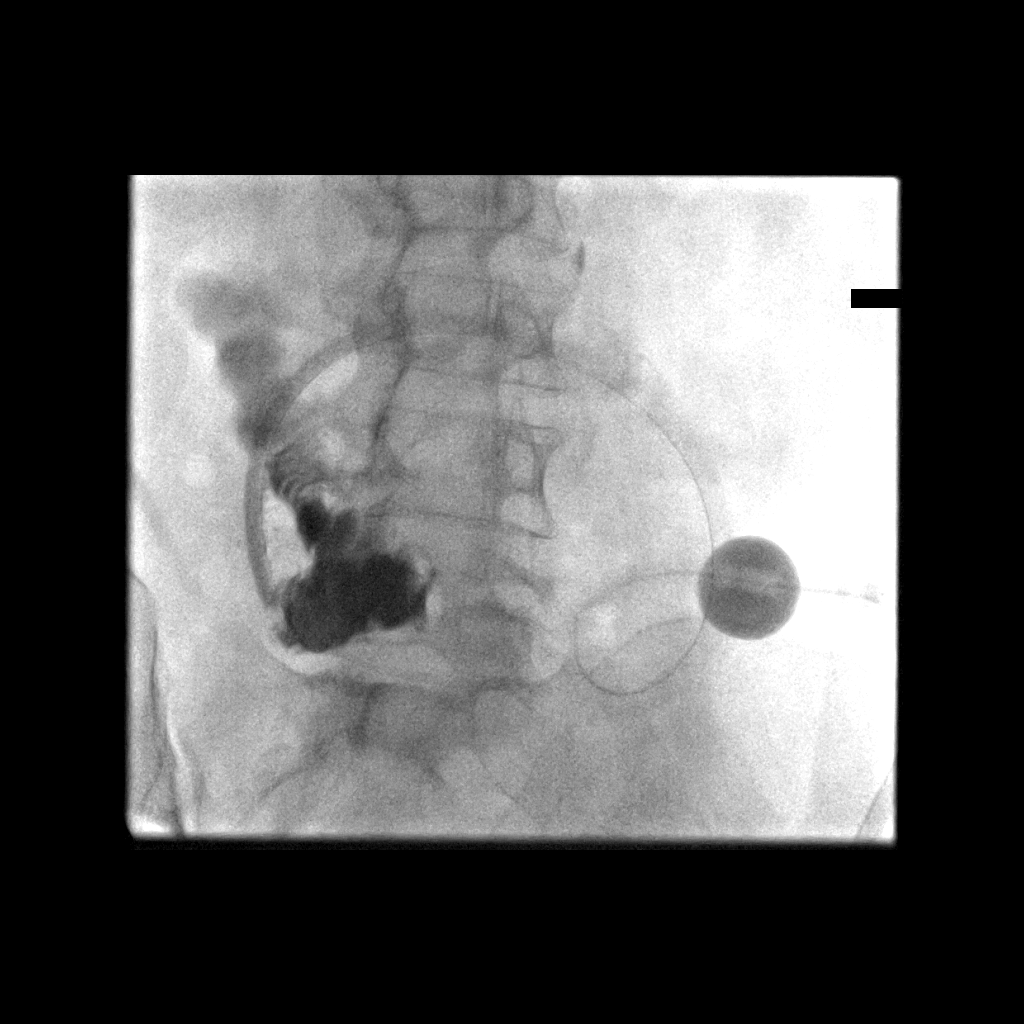

[5 of 5 positions shown; findings below may reference images not displayed]

02/09/2013; 12/30/2012

Medications: None

Contrast: 50 ml 2mnipaque-RYY, administered enterically

Fluoroscopy Time: 1.2 minutes.

Complications: None immediate

Technique / Findings:

Informed written consent was obtained from the patient after a
discussion of the risks, benefits and alternatives to treatment.
Questions regarding the procedure were encouraged and answered.  A
timeout was performed prior to the initiation of the procedure.

The external portion of the existing jejunostomy tube was prepped
and draped in the usual sterile fashion, and a sterile drape was
applied covering the operative field.  Maximum barrier sterile
technique with sterile gowns and gloves were used for the
procedure.  A timeout was performed prior to the initiation of the
procedure.

Contrast injection of the existing jejunostomy tube confirmed
appropriate intraluminal positioning. The basilar portion of
existing jejunostomy tube was cut and cannulated with a stiff
Glidewire which were advanced into the small bowel.  Under
intermittent fluoroscopic guidance, the catheter was exchanged for
a new 20-French jejunostomy tube.  The retention balloon was
inflated and the balloon was cinched.  Contrast was injected and
several spot fluoroscopic images were obtained in various
obliquities confirming intraluminal postioning.  The patient
tolerated the procedure well without immediate postprocedural
complication.
IMPRESSION: Successful fluoroscopic guided replacement of a new 20-French
jejunostomy tube.  The new jejunostomy tube is ready for immediate
use.

## 2013-11-07 ENCOUNTER — Telehealth: Payer: Self-pay | Admitting: Internal Medicine

## 2013-11-07 IMAGING — CR DG HIP COMPLETE 2+V*R*
4 series · 4 of 4 positions shown · non-contrast
Comparison: CT abdomen/pelvis 04/09/2011.

***ADDENDUM*** CREATED: 06/17/2012 [DATE]

Findings called to patient's nurse at the time dictation.
***END ADDENDUM*** SIGNED BY: Tyrek Rash, M.D.
CLINICAL DATA: Right hip pain for 3 days.  No injury.
RIGHT HIP - COMPLETE 2+ VIEW

[t pelvis a.p. (1 of 2)]
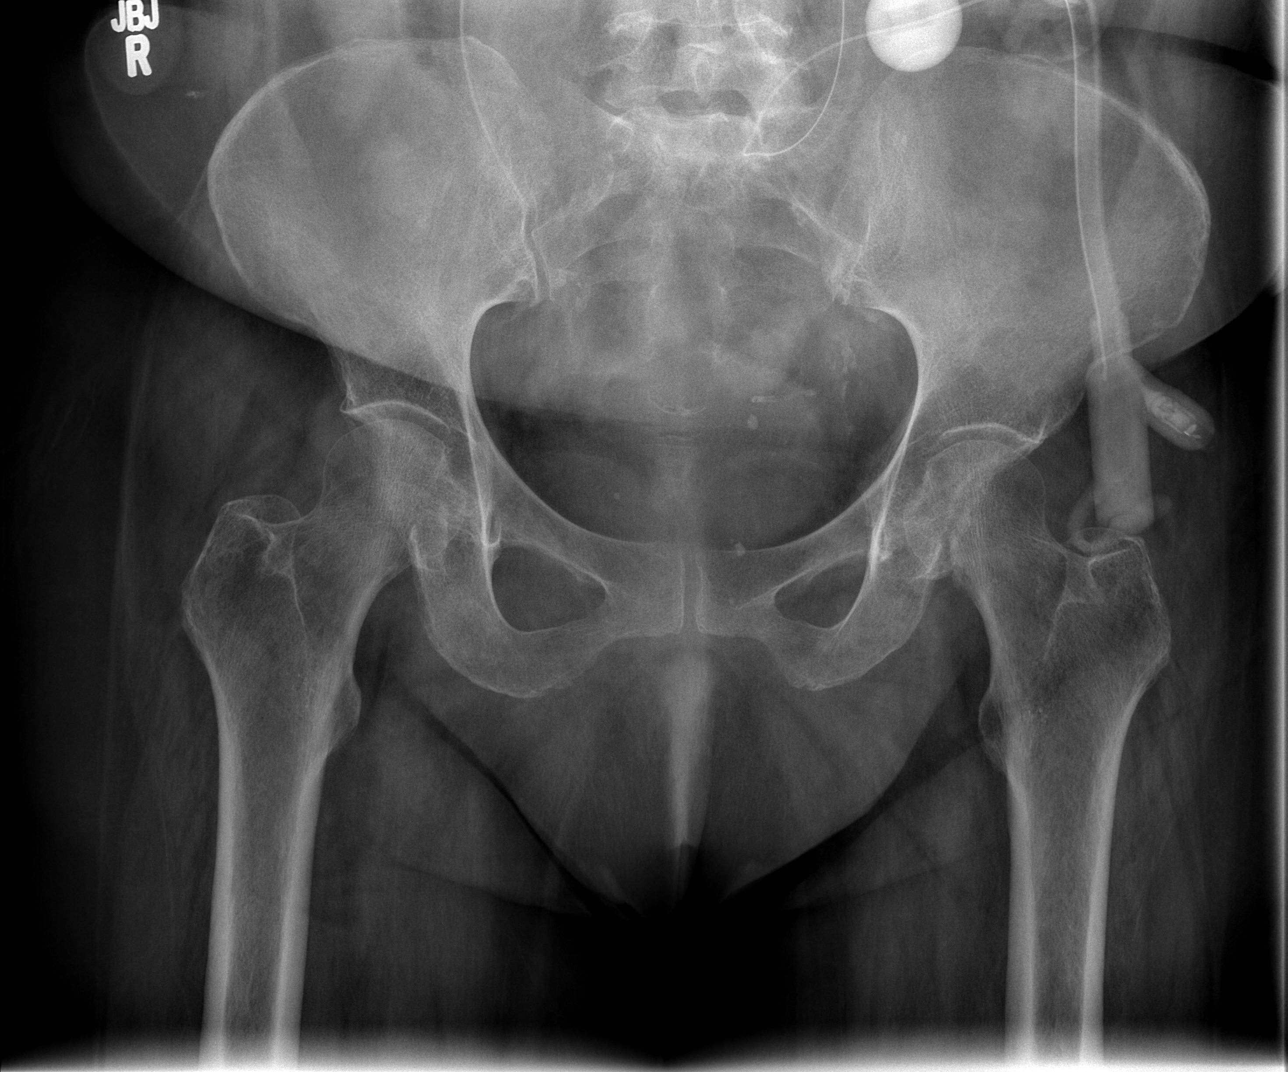

[t pelvis a.p. (2 of 2)]
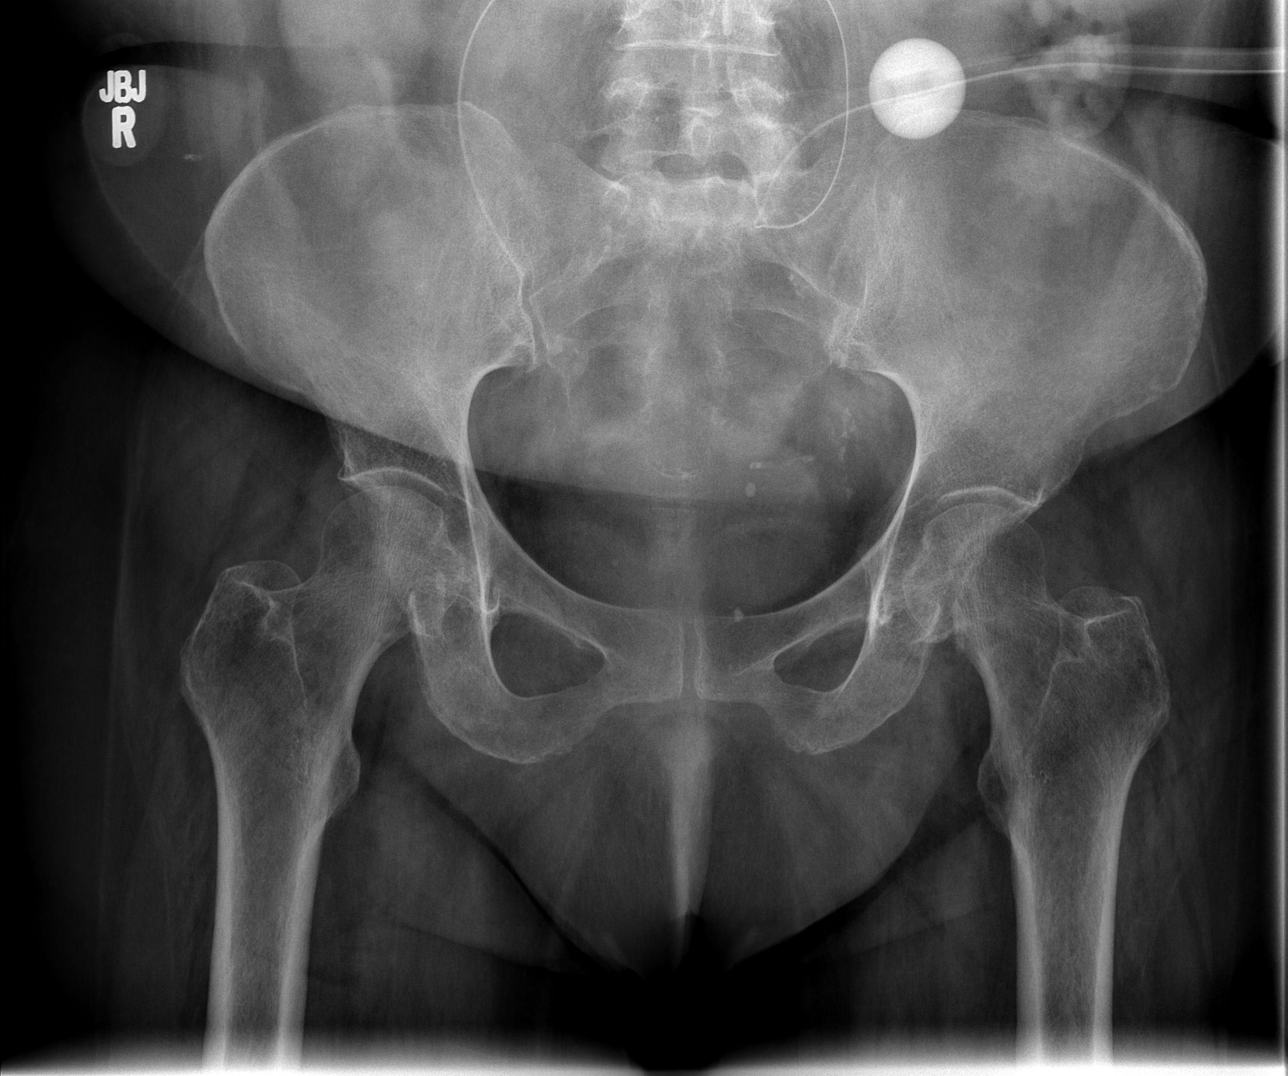

[t hip ap right]
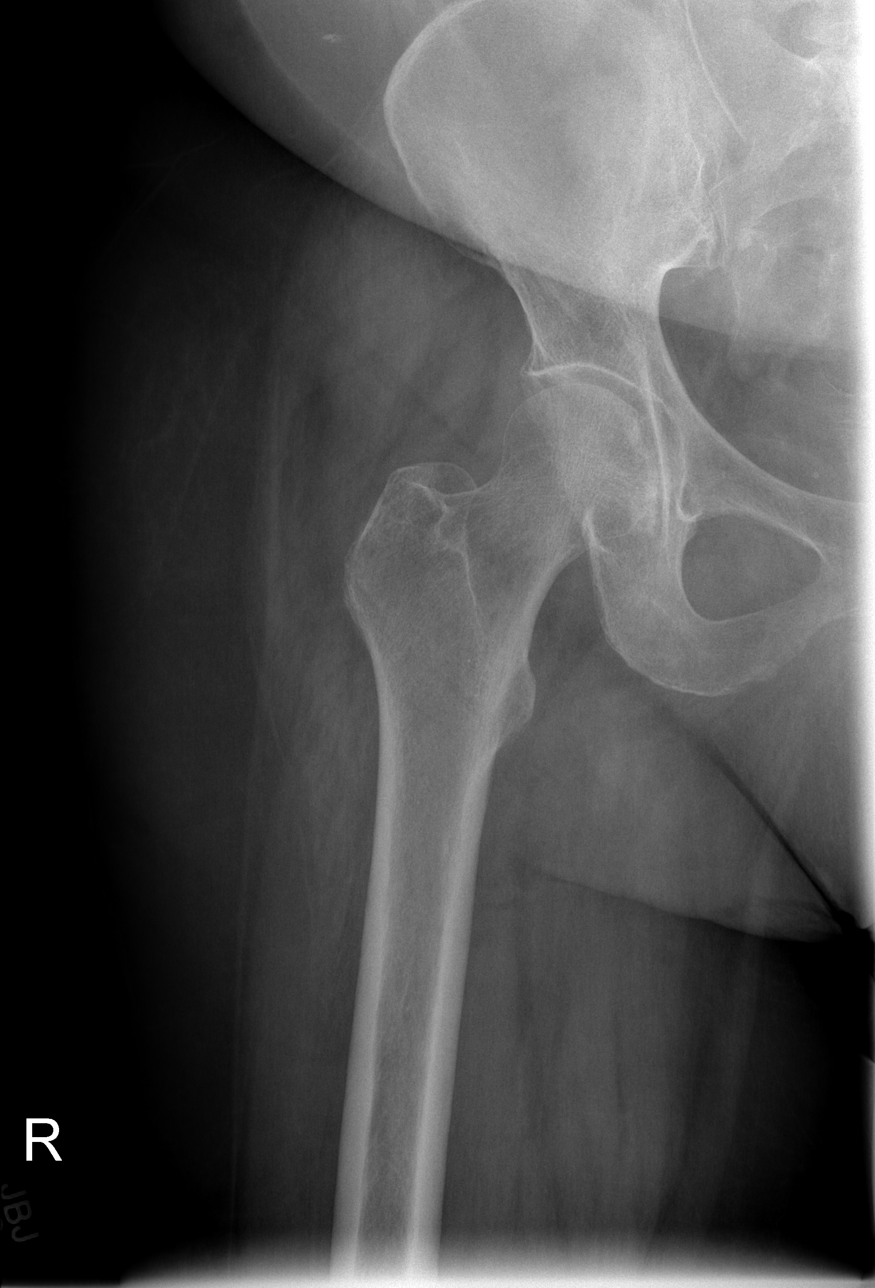

[t hip frog leg right]
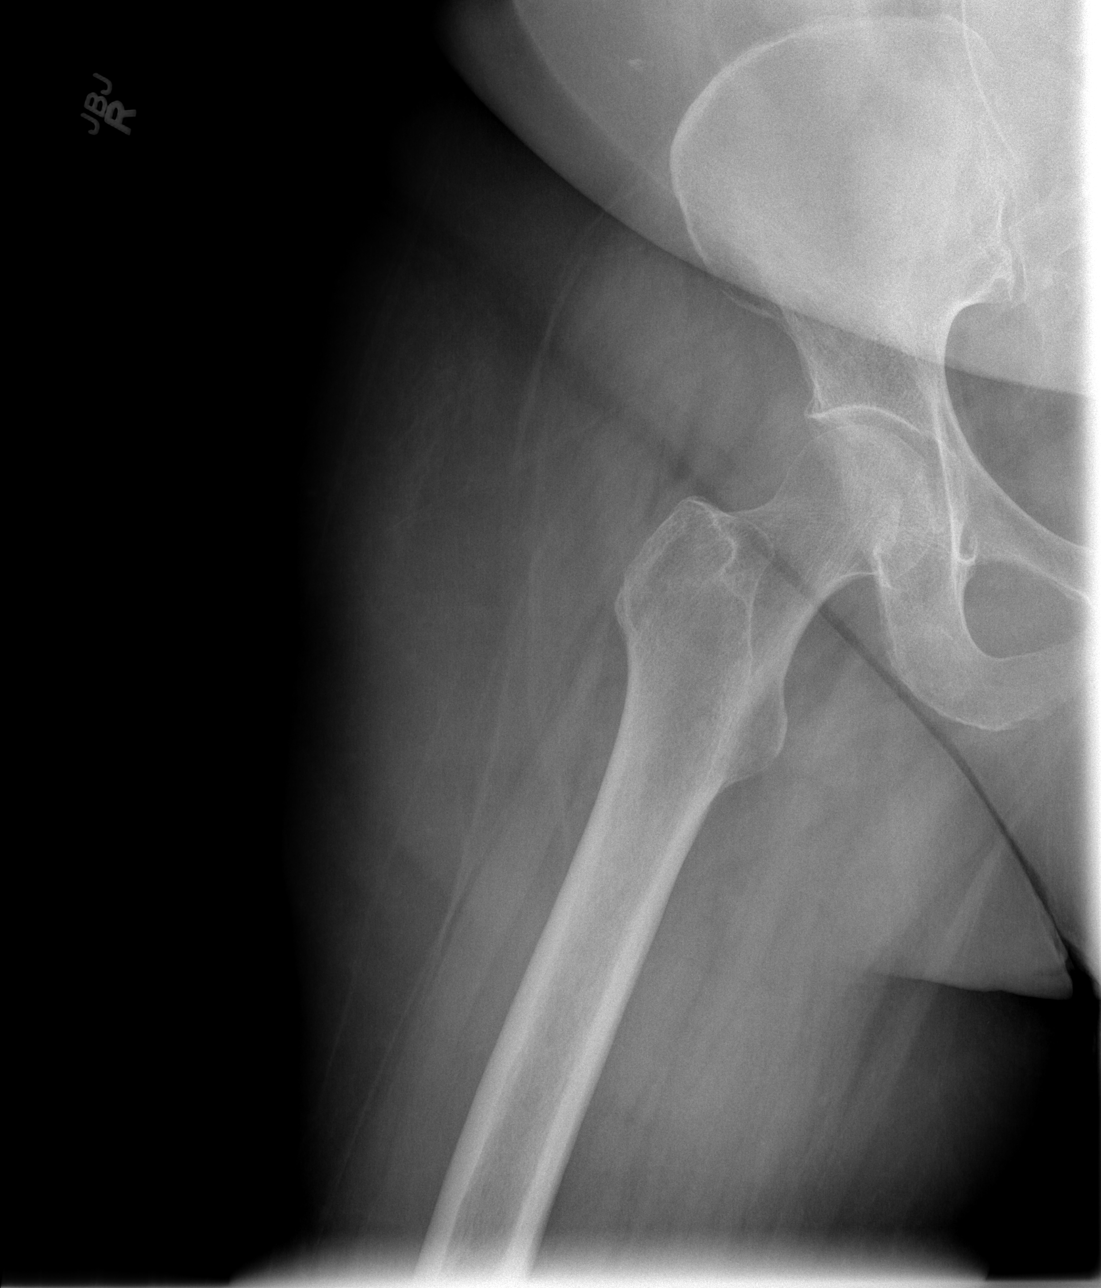

[4 of 4 positions shown; findings below may reference images not displayed]

FINDINGS: There are minimal symmetric degenerative changes of the
hips.  There is no acute fracture or dislocation.  Jejunostomy tube
over the left mid abdomen with tip over the right lower quadrant.
IMPRESSION: Mild symmetric degenerative change of the hips.  No acute findings.

## 2013-11-07 MED ORDER — PROMETHAZINE HCL 25 MG PO TABS: ORAL_TABLET | ORAL | Status: DC

## 2013-11-07 NOTE — Telephone Encounter (Signed)
I have spoken to [employer] [employer]) very briefly. I have advised that I am unable to give him much information as HIPAA does not allow me to speak with him without written consent from the patient. I did advise that we have sent in an appeal for the previous denial on promethazine. I have spoken to patient. I again explained in great detail the process that we have gone through. We sent a script for promethazine. We then attempted prior authorization. That was denied. We attempted appeal of denial. Appeal was sent on 09/14/13. This was supposedly not received by medicare grievance. Therefore, I sent the appeal again on 10/30/13 @ 3:27 pm. We still have not gotten an answer (they have 7 business days to respond). I have spent HOURS on the phone trying to get medication approved to no avail. I advised patient that if she feels phenergan is the only thing to help her, she may purchase this at cash cost at [name] for $16.67 #60 per month. Patient states that she was told by her insurance agent that the FDA denied the promethazine because they do not cover the medication for the condition we told them she has. I explained that I must be truthful with her insurance company and that I will not falsify information to them to get a medication approved. She verbalizes understanding.

## 2013-11-22 IMAGING — CT CT ABD-PELV W/ CM
1 of 3 series · 13 of 32 positions shown, 18 images · IV contrast (OMNIPAQUE 300)
Comparison: 06/10/2012

CLINICAL DATA: Abdominal pain.  Elevated LFTs

CT ABDOMEN AND PELVIS WITH CONTRAST
TECHNIQUE: Multidetector CT imaging of the abdomen and pelvis was
performed following the standard protocol during bolus
administration of intravenous contrast.
Contrast: 50mL OMNIPAQUE IOHEXOL 300 MG/ML  SOLN, 100mL OMNIPAQUE
IOHEXOL 300 MG/ML  SOLN

[Series 2: abd/pel with · axial · 0.74mm/px · z∈[-379,-4]mm · 13 of 85 slices shown, 18 images]
[im 5/85  soft-tissue]
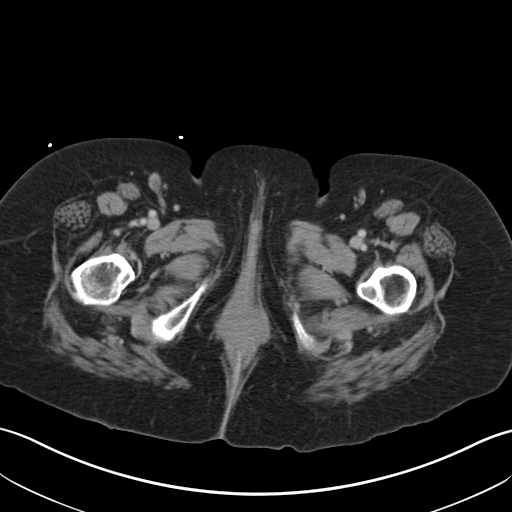
[im 5/85  bone]
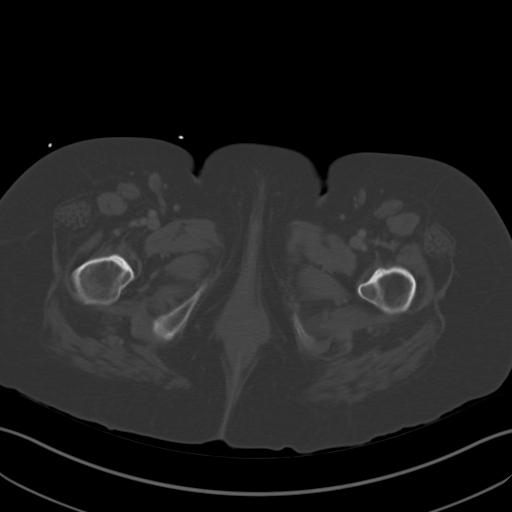
[im 14/85  soft-tissue]
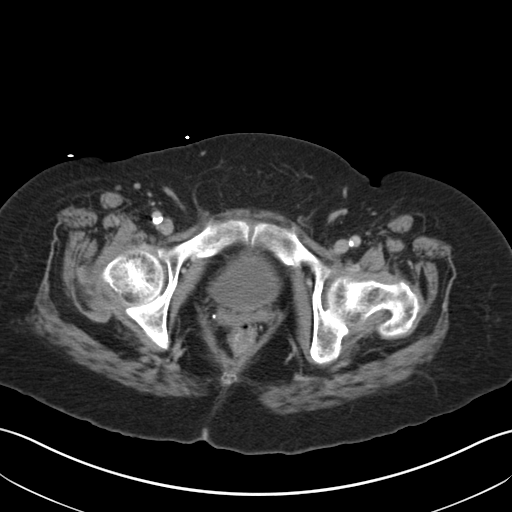
[im 18/85  soft-tissue]
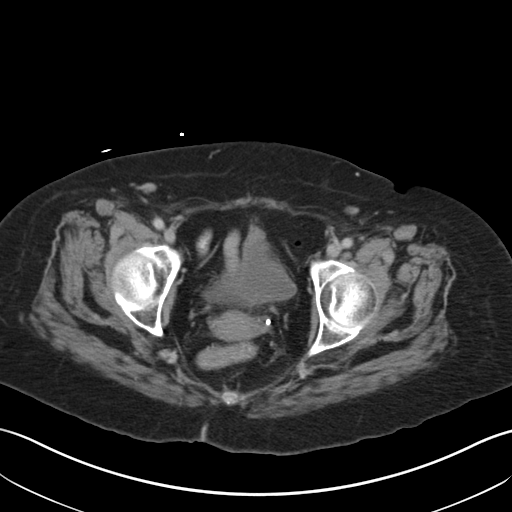
[im 27/85  soft-tissue]
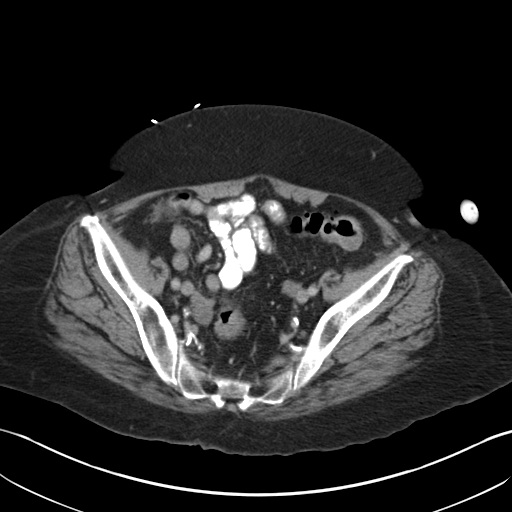
[im 31/85  soft-tissue]
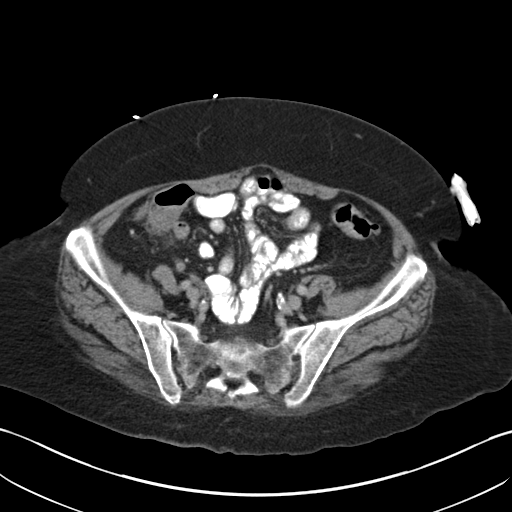
[im 40/85  soft-tissue]
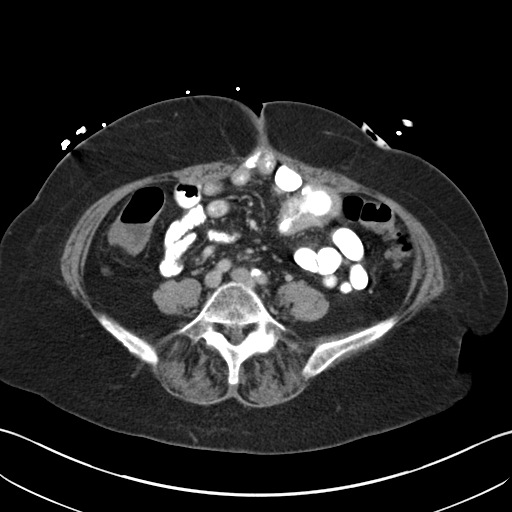
[im 45/85  soft-tissue]
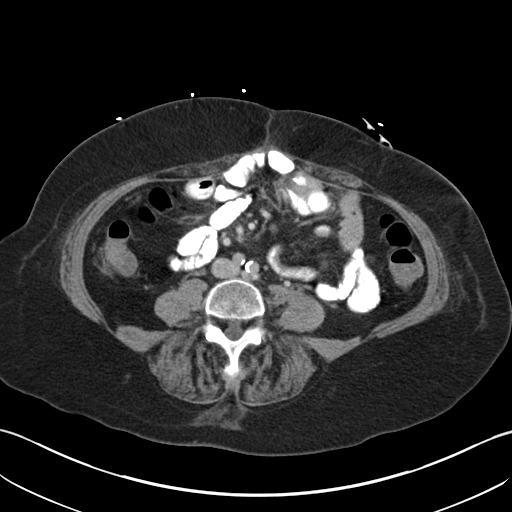
[im 54/85  soft-tissue]
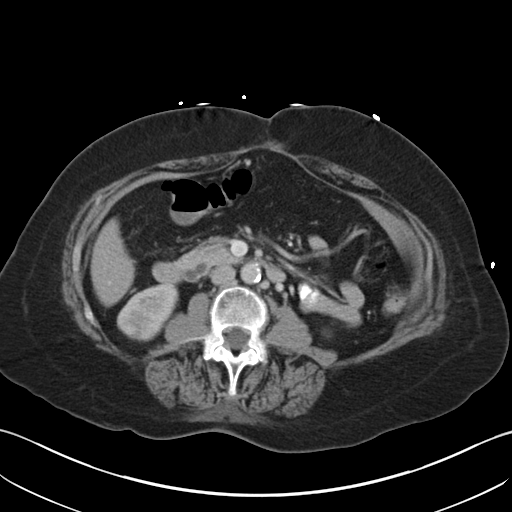
[im 58/85  soft-tissue]
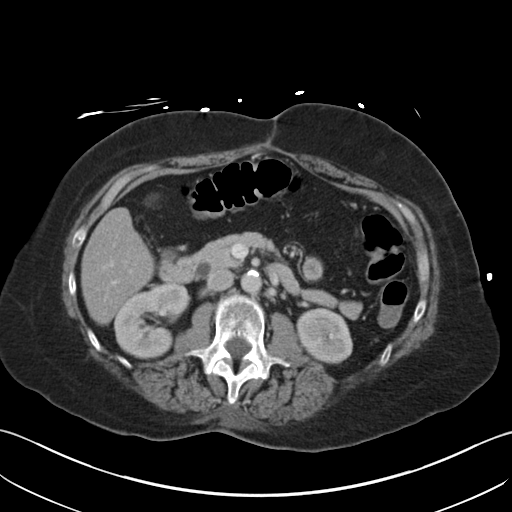
[im 58/85  bone]
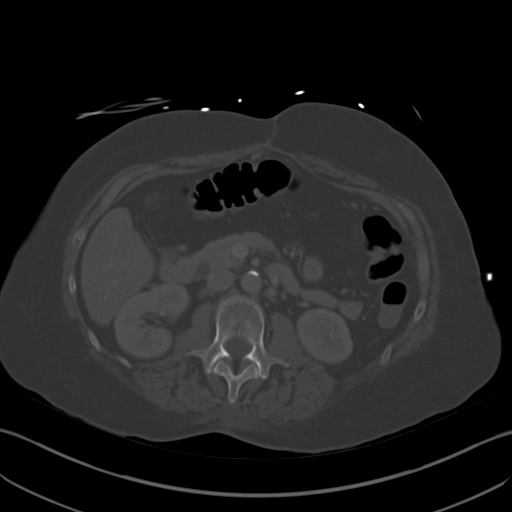
[im 67/85  soft-tissue]
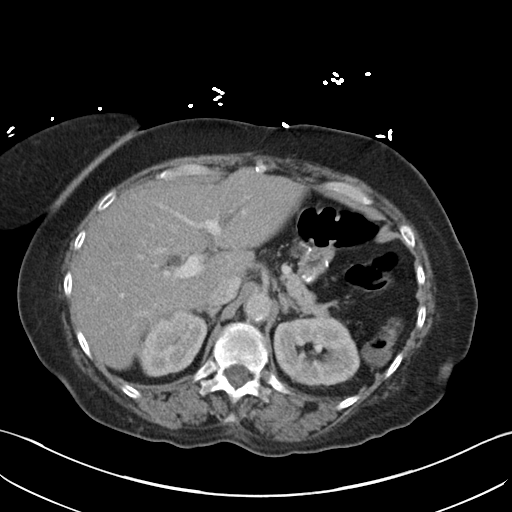
[im 67/85  lung]
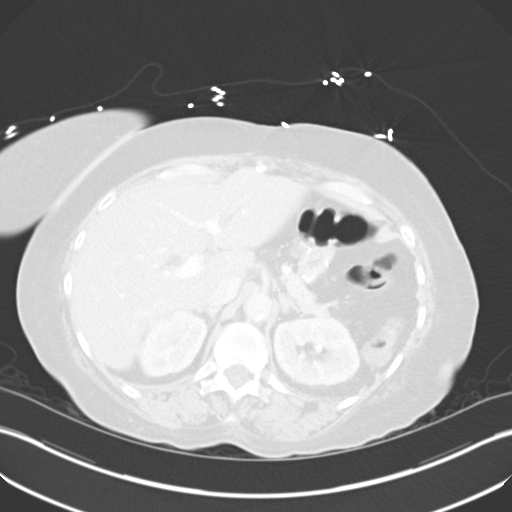
[im 71/85  soft-tissue]
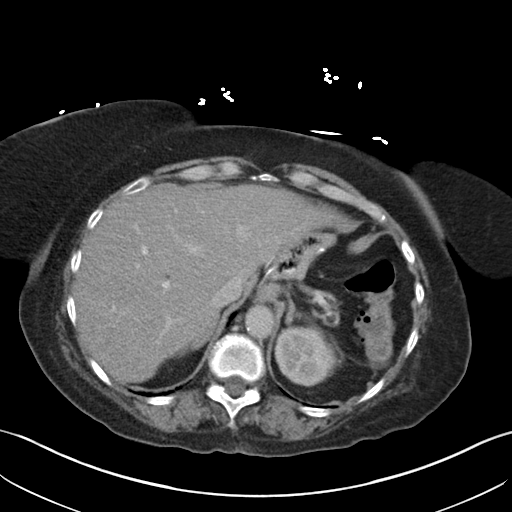
[im 71/85  lung]
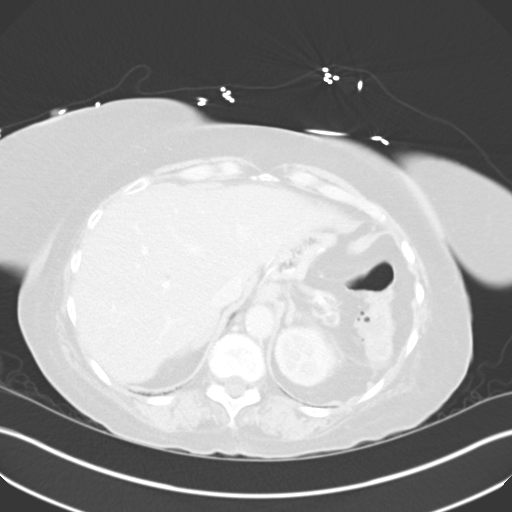
[im 76/85  lung]
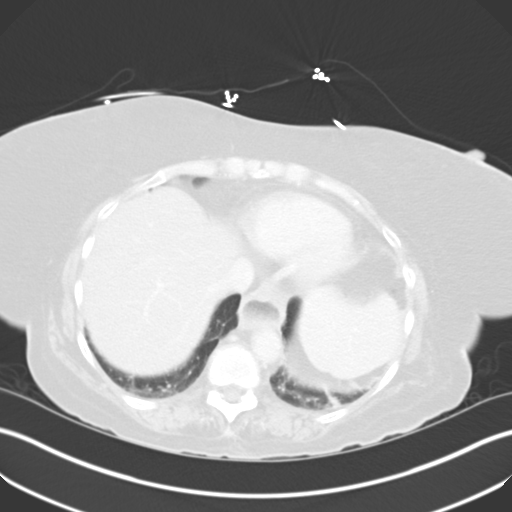
[im 80/85  soft-tissue]
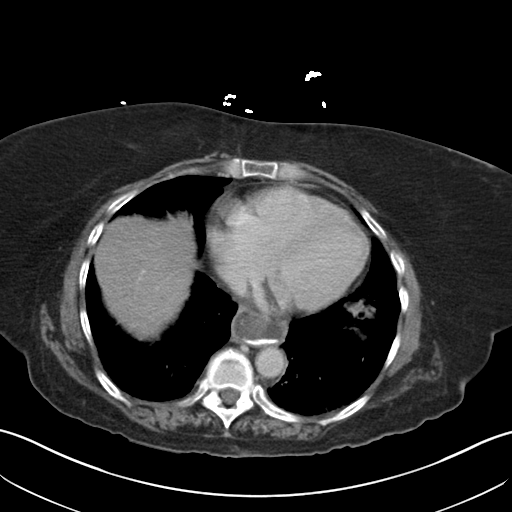
[im 80/85  lung]
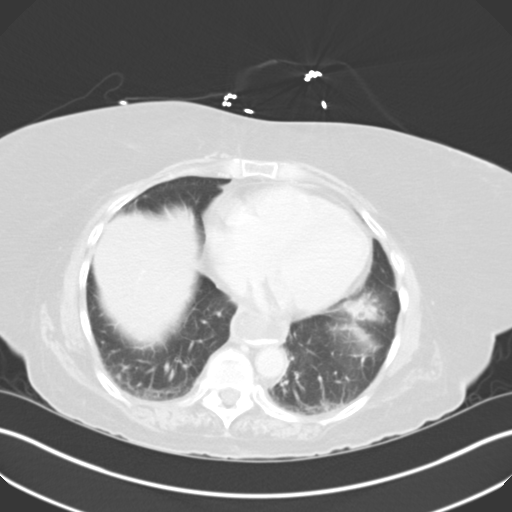

[13 of 32 positions shown; findings below may reference images not displayed]

FINDINGS: There is no pericardial effusion identified.  No pleural
effusion.  Atelectasis noted in the left base.  There are patchy
densities noted in the right base which appear new from previous
exam.

Mild diffuse low attenuation throughout the liver is noted.  Prior
cholecystectomy.  There is abnormal dilatation of the common bile
duct which measures 1.3 cm, image 26.  This is not significantly
changed from previous exam.  Mild intrahepatic bile duct dilatation
is present.  A similar appearance of focal dilatation of the distal
pancreatic duct which measures up to 6 mm.

Spleen is normal.  The adrenal glands are normal.  Normal
appearance of the left kidney.  Cyst is noted within the upper pole
the right kidney measuring 10 mm.  The urinary bladder appears
normal.  Uterus and the adnexal structures are negative.

Dilated and patulous esophagus is noted.  This is similar to
previous exam.  Postsurgical changes compatible with partial
gastrectomy and gastrojejunostomy noted.  The patient has a
jejunostomy tube which appears to be intraluminal with
opacification of normal caliber small bowel loops.  The appendix is
visualized and appears normal.  Normal appearance of the proximal
colon there are scattered distal colonic diverticula without acute
inflammation.

No free fluid or fluid collections within the upper abdomen or
pelvis.  There is no upper abdominal adenopathy.  No pelvic or
inguinal adenopathy.

Review of the visualized bony structures significant for
osteopenia.
IMPRESSION: 1.  No acute findings identified within the abdomen or pelvis.
2.  Chronic dilatation of the common bile duct status post
cholecystectomy.  In the setting of abnormal LFTs this could be
further evaluated with MRCP.
3.  Mild fatty infiltration of the liver.

4.  Postsurgical changes consistent with partial gastrectomy and
gastrojejunostomy.
5.  No evidence for bowel obstruction or abscess formation.

## 2013-12-05 ENCOUNTER — Telehealth: Payer: Self-pay | Admitting: Internal Medicine

## 2013-12-05 MED ORDER — PROMETHAZINE HCL 25 MG PO TABS: ORAL_TABLET | ORAL | Status: DC

## 2013-12-05 NOTE — Telephone Encounter (Signed)
rx sent

## 2014-01-02 ENCOUNTER — Other Ambulatory Visit: Payer: Self-pay | Admitting: *Deleted

## 2014-01-02 IMAGING — CR DG ABDOMEN ACUTE W/ 1V CHEST
3 series · 3 of 3 positions shown · non-contrast
Comparison: CT abdomen 07/02/2012 and acute abdominal series
06/10/2012.

CLINICAL DATA: Upper abdominal pain with shortness of breath and
productive cough.

ACUTE ABDOMEN SERIES (ABDOMEN 2 VIEW & CHEST 1 VIEW)

[x chest ap]
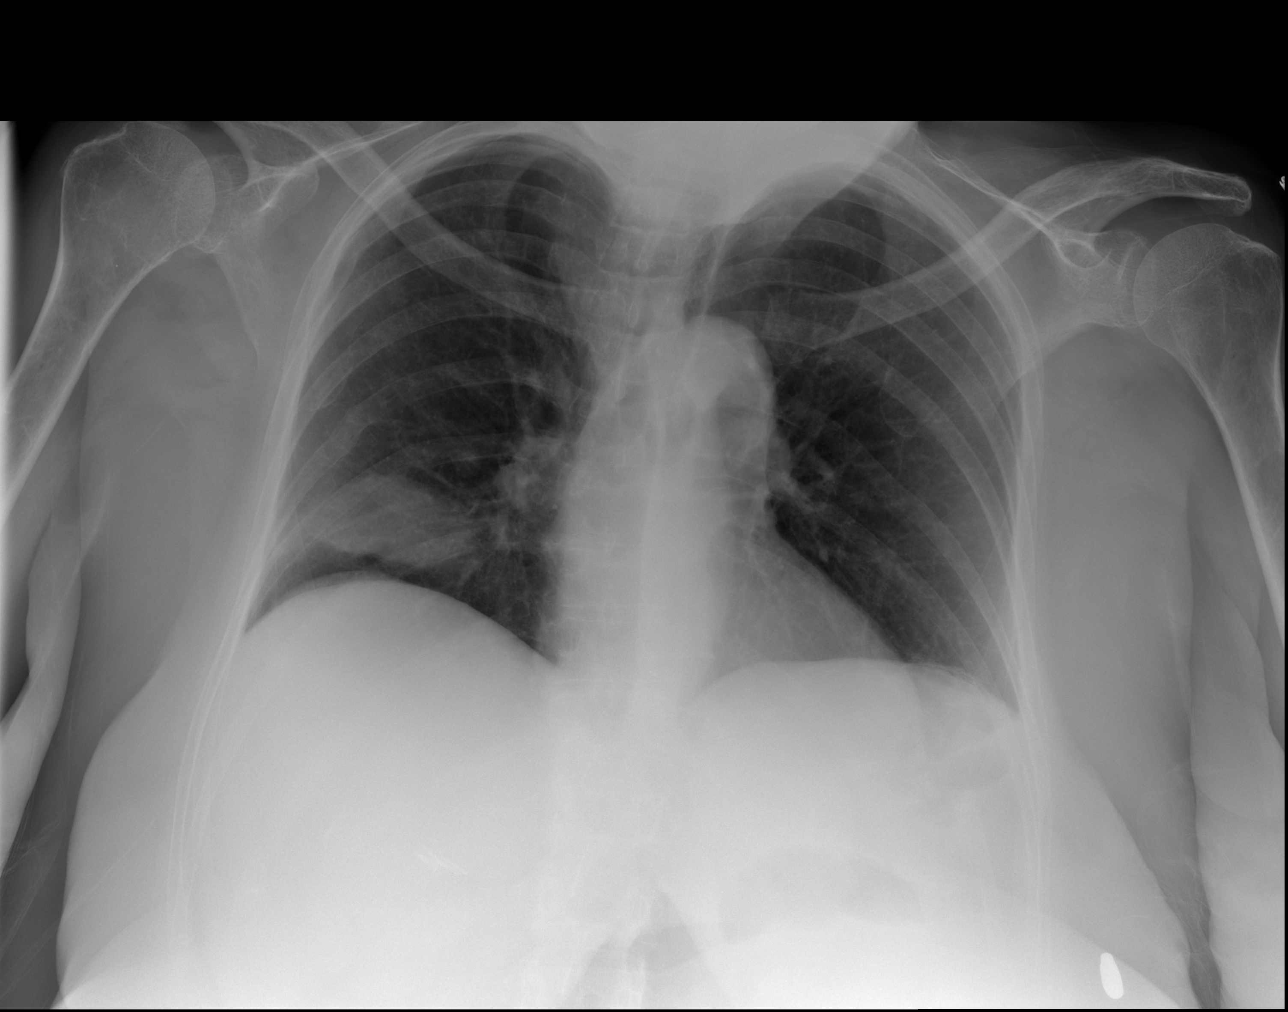

[x abdomen supine]
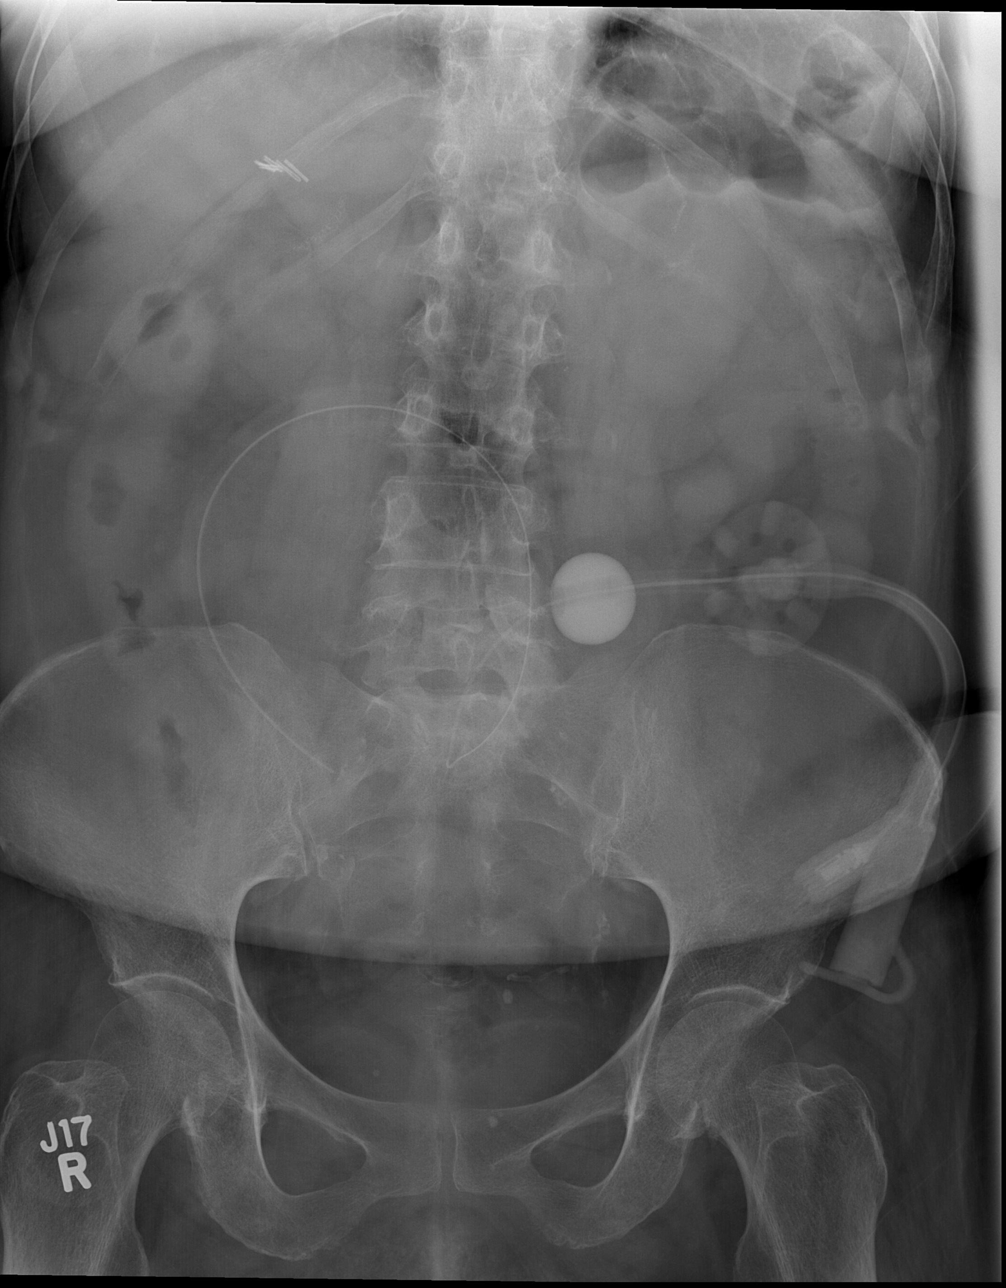

[w abdomen decub]
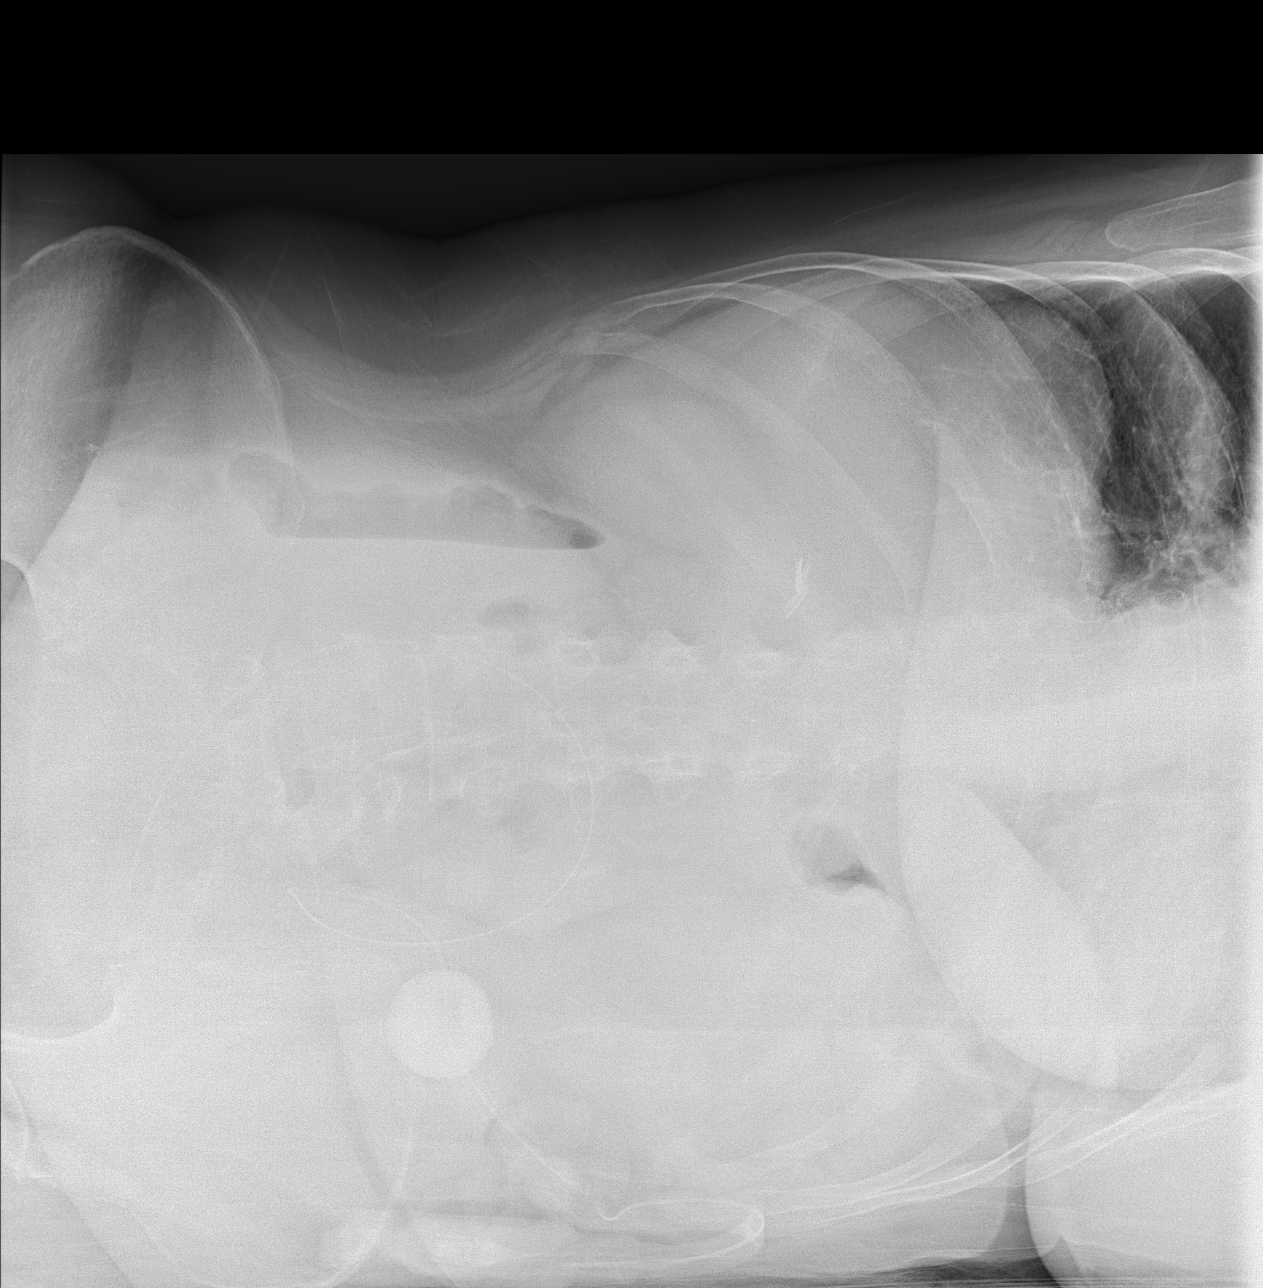

[3 of 3 positions shown; findings below may reference images not displayed]

FINDINGS: Frontal view of the chest shows midline trachea and
normal heart size.  There is an ovoid and smoothly marginated
opacity at the right lung base.  Lungs are low in volume but
otherwise clear.  Old right rib fractures are noted.

Two views of the abdomen show a percutaneous jejunostomy tube which
appears kinked proximally.  Postoperative changes are seen in the
upper abdomen.  Bowel gas pattern is unremarkable.  There may be
thickening of the ascending colon wall.
IMPRESSION: 1.  Possible loculated fluid in the right major fissure versus
right basilar airspace disease.
2.  Question thickening of the wall of the ascending colon.

## 2014-01-02 IMAGING — CR DG CHEST 1V PORT
1 series · 1 of 1 positions shown · non-contrast
Comparison: 08/12/2012 at 6600 hours

CLINICAL DATA: Possible pneumonia

PORTABLE CHEST - 1 VIEW

[AP]
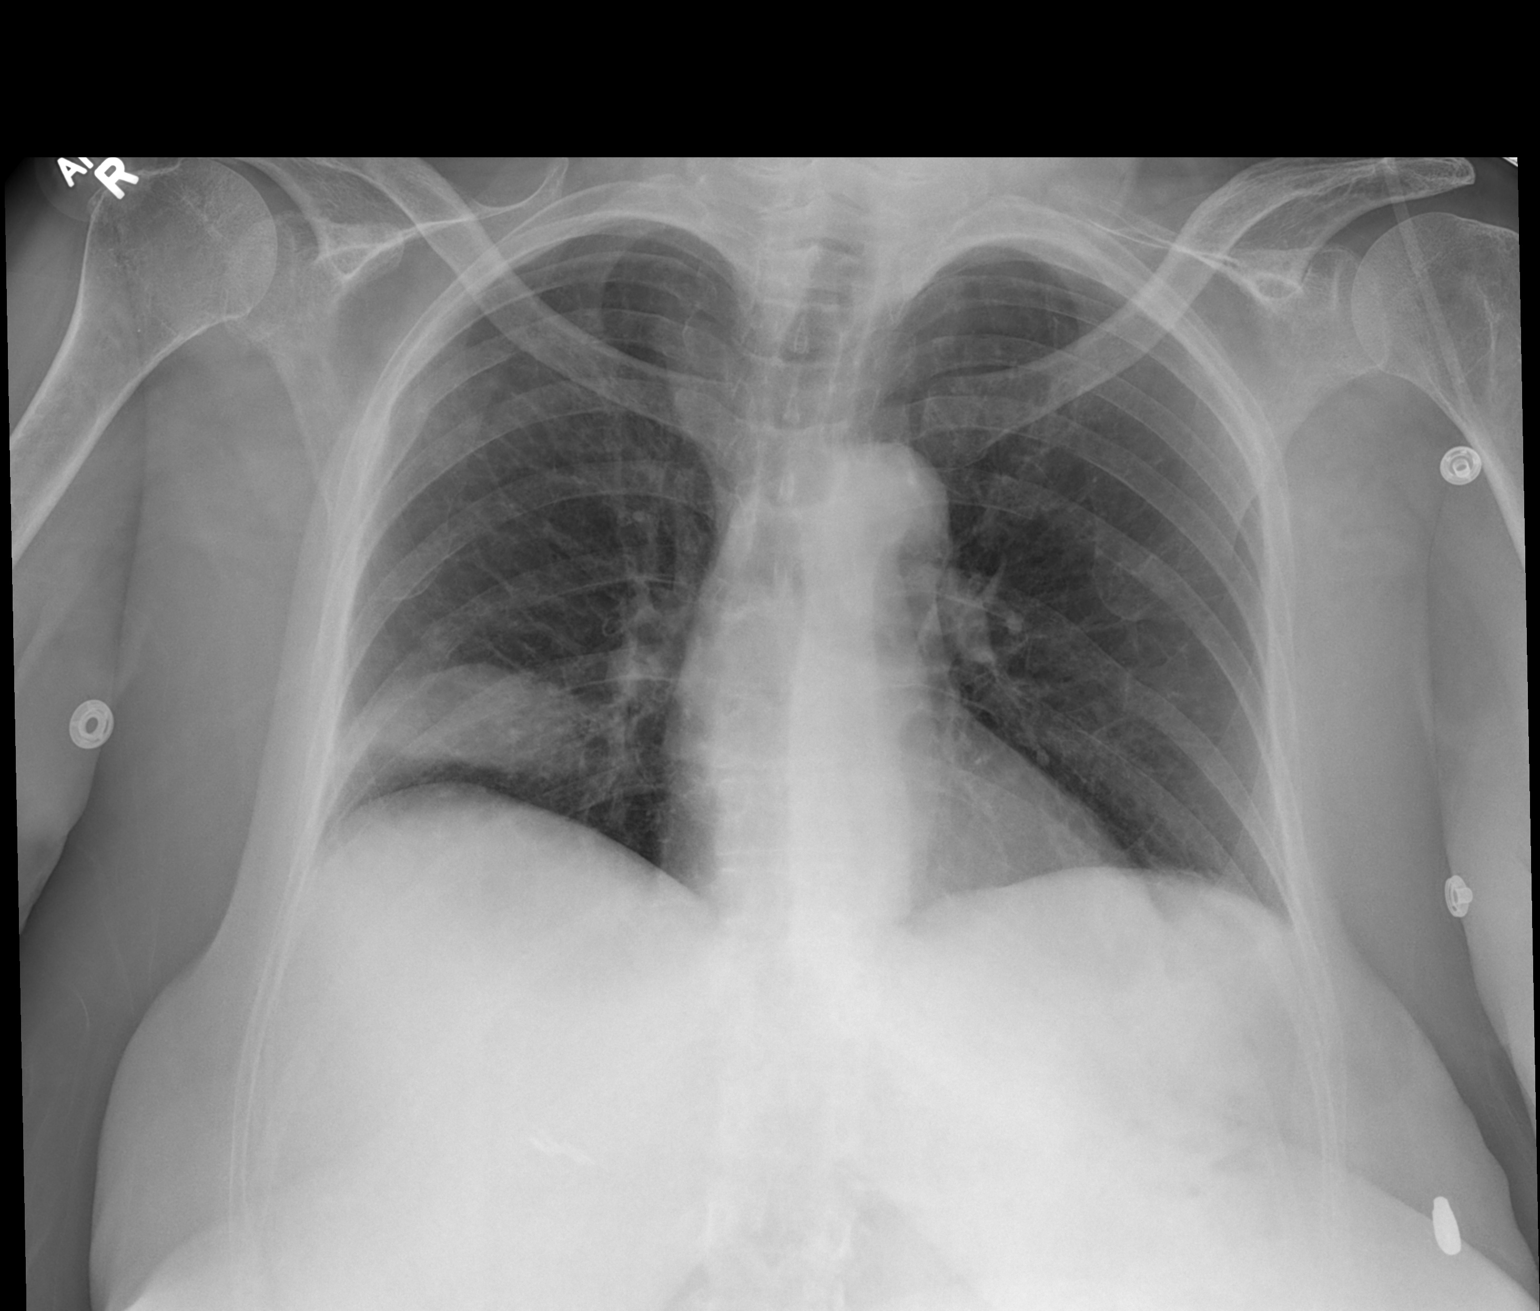

[1 of 1 positions shown; findings below may reference images not displayed]

FINDINGS: Focal opacity overlying the right lower lung.  The
appearance favors loculated fluid in the right major fissure,
although pneumonia is not excluded.

Mild patchy right upper lobe opacity, possibly reflecting
pneumonia.

No pleural effusion or pneumothorax.

The heart is normal in size.

Cholecystectomy.
IMPRESSION: Mild patchy right upper lobe opacity, possibly reflecting
pneumonia.

Focal opacity overlying the right lower lung, favored to reflect
loculated fluid, pneumonia not excluded.

## 2014-01-02 MED ORDER — DIPHENOXYLATE-ATROPINE 2.5-0.025 MG PO TABS: 1.0000 | ORAL_TABLET | Freq: Two times a day (BID) | ORAL | Status: DC | PRN

## 2014-01-02 MED ORDER — LORAZEPAM 0.5 MG PO TABS: ORAL_TABLET | ORAL | Status: DC

## 2014-01-05 ENCOUNTER — Other Ambulatory Visit: Payer: Self-pay | Admitting: Internal Medicine

## 2014-01-05 IMAGING — MR MR 3D RECON AT SCANNER
20 of 21 series · 20 of 21 positions shown · IV contrast (multihance)
Comparison: CT abdomen pelvis dated 07/02/2012

CLINICAL DATA: Abdominal pain, dilated CBD, evaluate for stones

MRI ABDOMEN WITHOUT AND WITH CONTRAST (INCLUDING MRCP)
TECHNIQUE: Multiplanar multisequence MR imaging of the abdomen was
performed both before and after the administration of intravenous
contrast. Heavily T2-weighted images of the biliary and pancreatic
ducts were obtained, and three-dimensional MRCP images were
rendered by post processing.
Contrast: 14mL MULTIHANCE GADOBENATE DIMEGLUMINE 529 MG/ML IV SOLN

[Series 3: T2 fat-sat · axial · 5.0mm · 0.88mm/px · 1 of 43 slices shown]
[im 1/43]
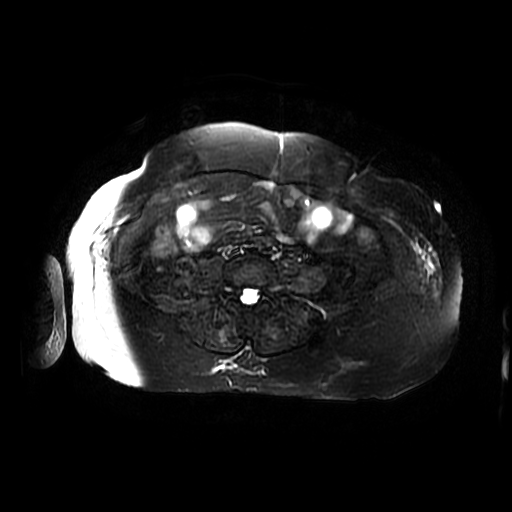

[Series 4: MRCP · coronal · 1.6mm · 0.62mm/px · 1 of 120 slices shown (1 of 6)]
[im 1/120]
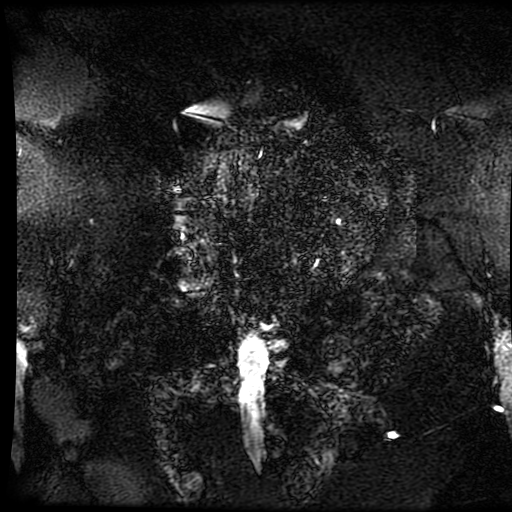

[Series 5: MRCP · coronal · 2.0mm · 0.70mm/px · 1 of 48 slices shown (2 of 6)]
[im 1/48]
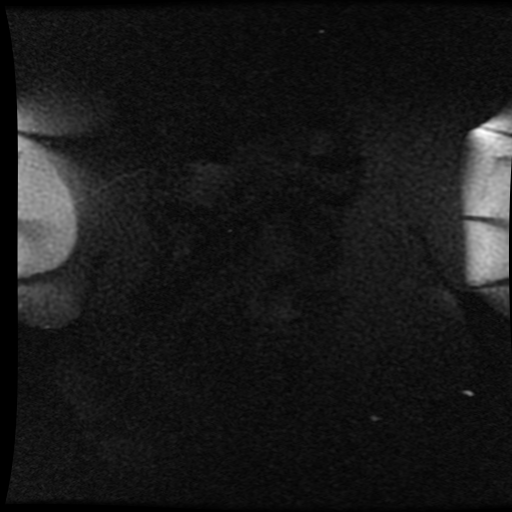

[Series 6: DWI b500 · axial · 6.0mm · 1.48mm/px · 1 of 60 slices shown]
[im 1/60]
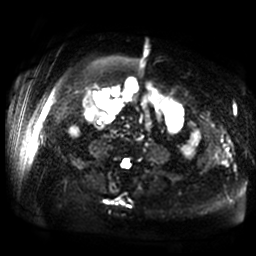

[Series 9: ax dualecho · axial · 5.0mm · 0.78mm/px · 1 of 86 slices shown]
[im 1/86]
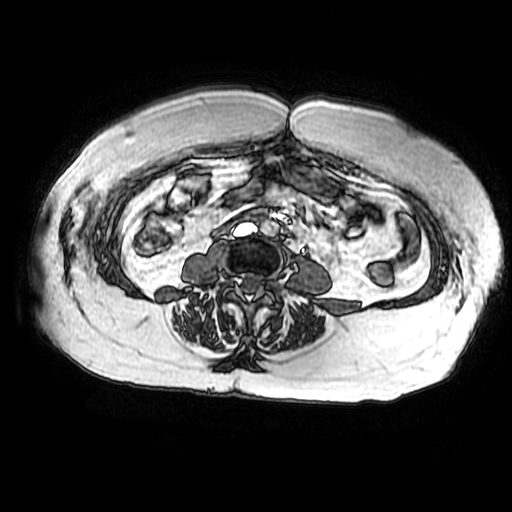

[Series 11: bSSFP fat-sat · coronal · 5.0mm · 0.86mm/px · 1 of 40 slices shown]
[im 1/40]
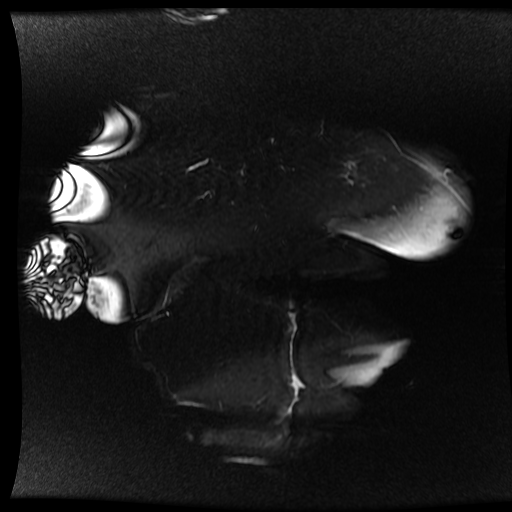

[Series 12: MRCP · coronal · 40.0mm · 0.70mm/px · 1 of 15 slices shown (3 of 6)]
[im 1/15]
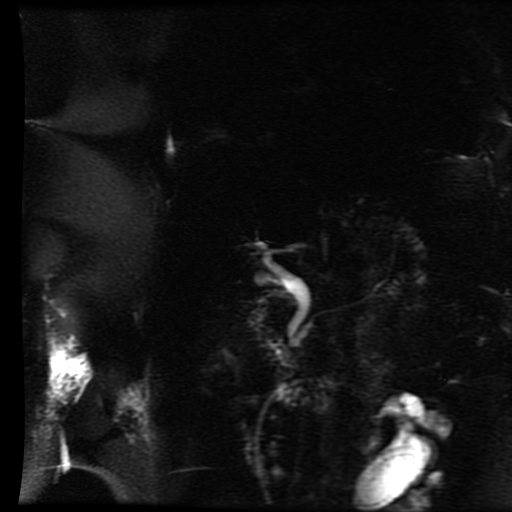

[Series 13: T2 · axial · 5.0mm · 0.78mm/px · 1 of 43 slices shown]
[im 1/43]
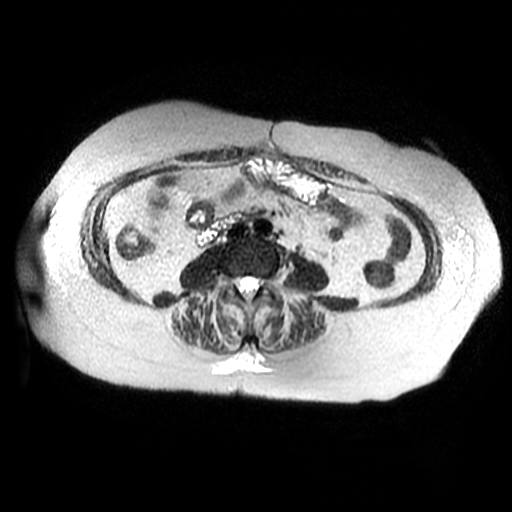

[Series 14: MRCP · coronal · 40.0mm · 0.70mm/px · 1 of 6 slices shown (4 of 6)]
[im 1/6]
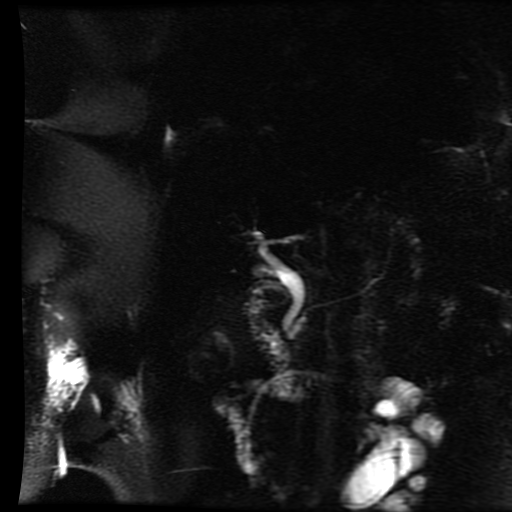

[Series 17: T1 dynamic · axial · delayed · 5.0mm · 0.78mm/px · 1 of 88 slices shown (1 of 5)]
[im 1/88]
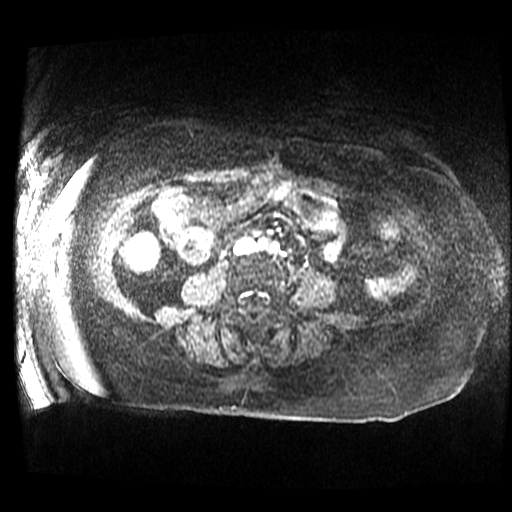

[Series 400: MRCP · axial · 1.6mm · 0.62mm/px · 1 of 124 slices shown (5 of 6)]
[im 1/124]
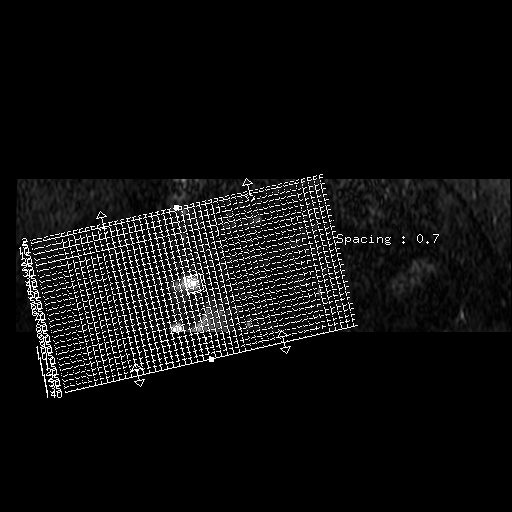

[Series 401: MRCP · coronal · 100.0mm · 0.51mm/px · 1 of 37 slices shown (6 of 6)]
[im 1/37]
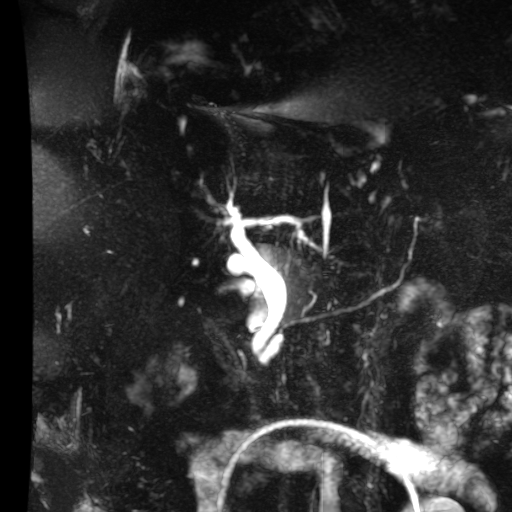

[Series 600: DWI · axial · 6.0mm · 1.48mm/px · 1 of 30 slices shown]
[im 1/30]
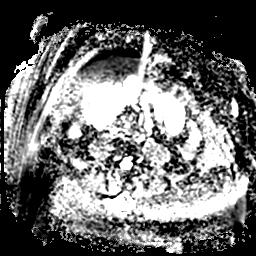

[Series 1500: T1 dynamic · axial · 5.0mm · 0.78mm/px · 1 of 88 slices shown (2 of 5)]
[im 1/88]
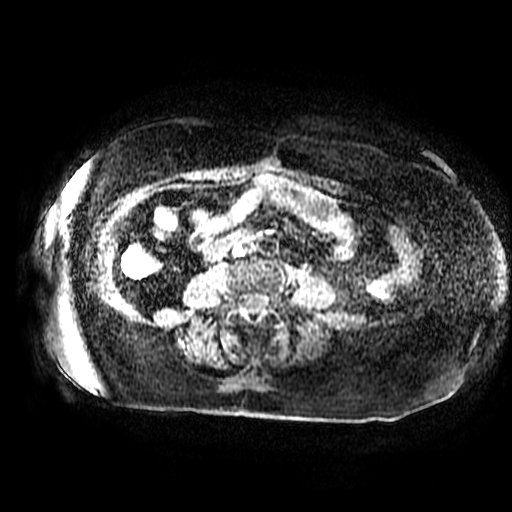

[Series 1501: T1 dynamic · axial · 5.0mm · 0.78mm/px · 1 of 88 slices shown (3 of 5)]
[im 1/88]
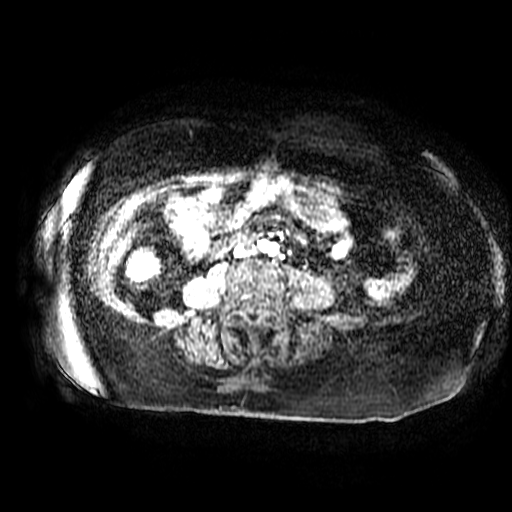

[Series 1502: T1 dynamic · axial · 5.0mm · 0.78mm/px · 1 of 88 slices shown (4 of 5)]
[im 1/88]
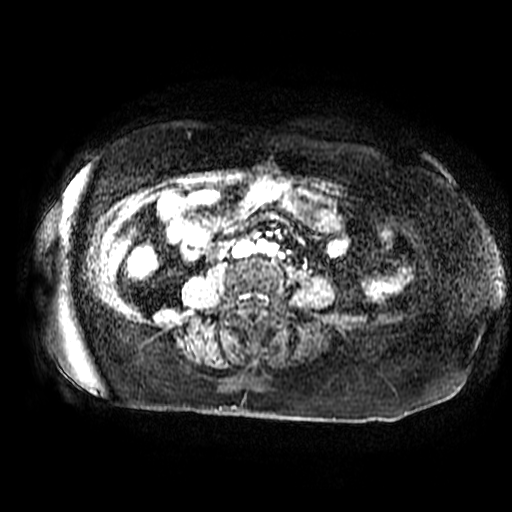

[Series 1503: T1 dynamic · axial · 5.0mm · 0.78mm/px · 1 of 88 slices shown (5 of 5)]
[im 1/88]
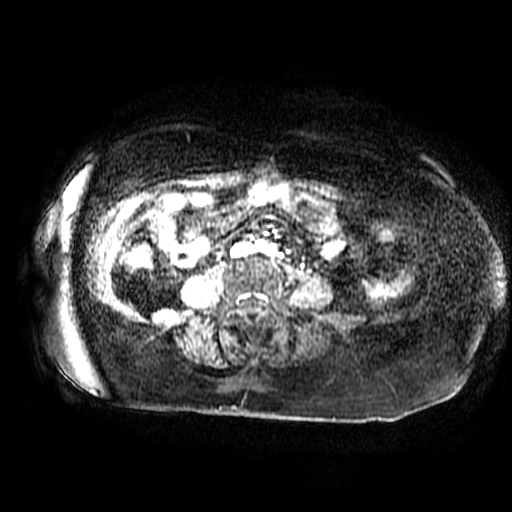

[((id)/(id)/1)-((id)/(id)/1) · axial · 5.0mm · 0.78mm/px · 1 of 88 slices shown (1 of 3)]
[im 1/88]
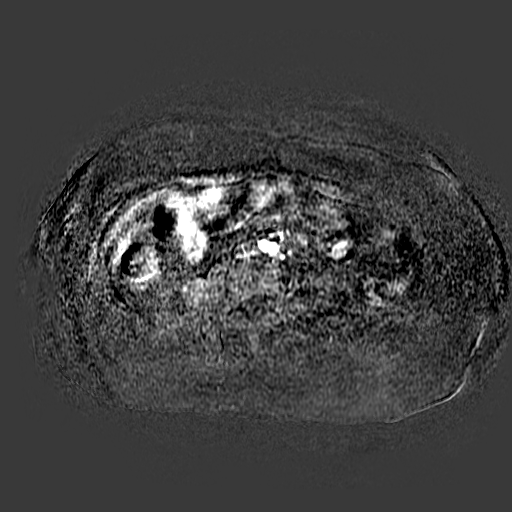

[((id)/(id)/1)-((id)/(id)/1) · axial · 5.0mm · 0.78mm/px · 1 of 88 slices shown (2 of 3)]
[im 1/88]
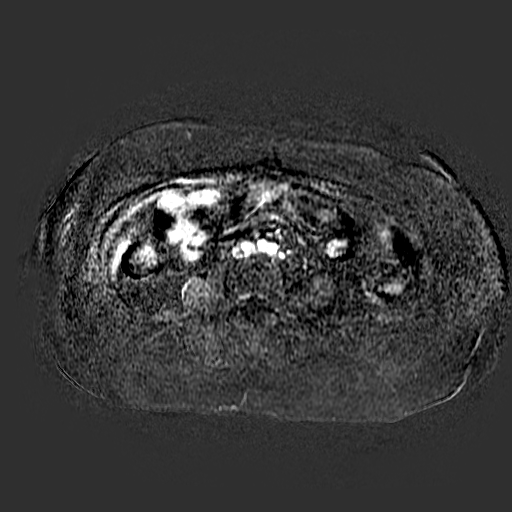

[((id)/(id)/1)-((id)/(id)/1) · axial · 5.0mm · 0.78mm/px · 1 of 88 slices shown (3 of 3)]
[im 1/88]
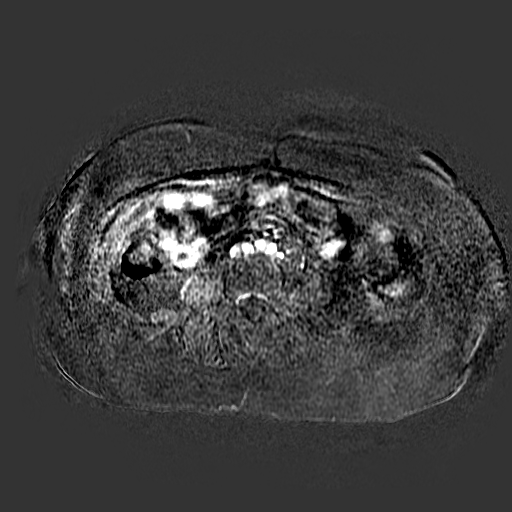

[20 of 21 positions shown; findings below may reference images not displayed]

FINDINGS: 6 mm nodule in the right middle lobe (series 17/image
6).  Additional focal patchy opacity in the right middle lobe,
suspicious for pneumonia (series 11/image 20).  Notably, there is
possible central necrosis (series 17/image 12).

Liver is within normal limits.  No suspicious/enhancing hepatic
lesions.

Spleen, pancreas, and adrenal glands are within normal limits.

Status post cholecystectomy. No intrahepatic ductal dilatation.
Common duct measures 12 mm (series 3/image 21) and abruptly tapers
at the ampulla (series 3/image 28).  No choledocholithiasis is
seen.

Multiple small bilateral renal cysts.  No enhancing renal lesions.
No hydronephrosis.

No abdominal ascites.

No suspicious abdominal lymphadenopathy.

No focal osseous lesions.
IMPRESSION: Status post cholecystectomy.  Common duct measures 12 mm and tapers
abruptly at the ampulla.  No choledocholithiasis is seen.

In the setting of abnormal LFTs, consider ERCP to assess for distal
CBD stricture or nonvisualized stone.

Patchy right middle lobe opacity, suspicious for pneumonia,
although notable for possible central necrosis.  Additional 6 mm
right middle lobe pulmonary nodule.  CT chest with contrast is
suggested for further evaluation.

## 2014-01-06 IMAGING — NM NM HEPATOBILIARY IMAGE, INC GB
1 series · 6 of 6 positions shown · non-contrast
Comparison: 08/15/2012

CLINICAL DATA: Evaluate patency of the common bile duct.

NUCLEAR MEDICINE HEPATOBILIARY IMAGING
TECHNIQUE: Sequential images of the abdomen were obtained [DATE] minutes following intravenous administration of
radiopharmaceutical.
Radiopharmaceutical:  5.2mCi Ac-WWm Choletec

[Series 1: he hepato · 4.75mm/px · 6 of 20 frames shown]
[frame 2/20]
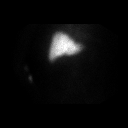
[frame 5/20]
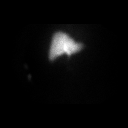
[frame 9/20]
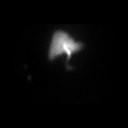
[frame 12/20]
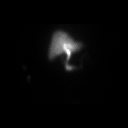
[frame 15/20]
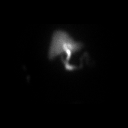
[frame 19/20]
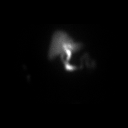

[6 of 6 positions shown; findings below may reference images not displayed]

FINDINGS: Following the intravenous administration of the
radiopharmaceutical there is uniform radiotracer uptake by the
liver with clearance from blood pool.  Radiotracer activity is
identified accumulating within the biliary tree by 4 minutes.
After 7 minutes there is radiotracer activity filling the common
bile duct.  After 9 minutes there is radiotracer activity within
the small bowel.
IMPRESSION: 1. Examination is positive for biliary patency.

## 2014-01-09 ENCOUNTER — Telehealth: Payer: Self-pay | Admitting: Internal Medicine

## 2014-01-09 NOTE — Telephone Encounter (Signed)
Dr Olevia Perches, patient got #20 promethazine tablets on 12/05/13 and 20 more tablets on 12/22/13. Do you want me to refill early? Do I need to give her more than #20 tablets per month?

## 2014-01-09 NOTE — Telephone Encounter (Signed)
She can have Phenergan 25 mg, #30 per 30 days=1 month. Not more than that. It has to last 30 days. So she can have a refill now and next one will be on 02/09/2014/

## 2014-01-10 MED ORDER — PROMETHAZINE HCL 25 MG PO TABS: ORAL_TABLET | ORAL | Status: DC

## 2014-01-10 NOTE — Telephone Encounter (Signed)
Left message for patient to call back  

## 2014-01-10 NOTE — Telephone Encounter (Signed)
I have spoken to patient to advise of Dr Nichola Sizer recommendations for her to have only #30 tablets per 30 days. Patient states that her script from [facility] says it is a 4 day supply for #20 tablets. I explained that [facility] does not take into account that the rx is for as needed. Again explained Dr Nichola Sizer wishes. Patient verbalizes understanding.

## 2014-01-15 ENCOUNTER — Other Ambulatory Visit: Payer: Self-pay | Admitting: *Deleted

## 2014-01-15 MED ORDER — DEXLANSOPRAZOLE 60 MG PO CPDR: 60.0000 mg | DELAYED_RELEASE_CAPSULE | Freq: Every day | ORAL | Status: DC

## 2014-01-17 ENCOUNTER — Encounter: Payer: Self-pay | Admitting: Internal Medicine

## 2014-01-17 ENCOUNTER — Telehealth: Payer: Self-pay | Admitting: Internal Medicine

## 2014-01-17 MED ORDER — DEXLANSOPRAZOLE 60 MG PO CPDR: 60.0000 mg | DELAYED_RELEASE_CAPSULE | Freq: Every day | ORAL | Status: DC

## 2014-01-17 NOTE — Telephone Encounter (Signed)
Rx was sent to [facility] in error. I have sent rx to [facility] instead. Patient aware.

## 2014-02-12 ENCOUNTER — Telehealth: Payer: Self-pay | Admitting: Internal Medicine

## 2014-02-12 NOTE — Telephone Encounter (Signed)
Spoke with patient and she saw her PCP last week. She has been having chest pain after she eats. Does not last long but "takes my breath." Her PCP is scheduling her an OV with cardiology and also wanted her to see Dr. Olevia Perches. She also reports dysphagia off and on. She states she eats slow but some times the food does not want to go down. Scheduled with Dr. Olevia Perches tomorrow at 2:15 PM.

## 2014-02-12 NOTE — Telephone Encounter (Signed)
I will see her tomorrow

## 2014-02-13 ENCOUNTER — Encounter: Payer: Self-pay | Admitting: Internal Medicine

## 2014-02-13 ENCOUNTER — Ambulatory Visit (INDEPENDENT_AMBULATORY_CARE_PROVIDER_SITE_OTHER): Payer: PRIVATE HEALTH INSURANCE | Admitting: Internal Medicine

## 2014-02-13 VITALS — BP 130/72 | HR 84 | Ht 64.0 in | Wt 122.0 lb

## 2014-02-13 DIAGNOSIS — K224 Dyskinesia of esophagus: Secondary | ICD-10-CM

## 2014-02-13 DIAGNOSIS — K2100 Gastro-esophageal reflux disease with esophagitis, without bleeding: Secondary | ICD-10-CM

## 2014-02-13 DIAGNOSIS — R633 Feeding difficulties, unspecified: Secondary | ICD-10-CM

## 2014-02-13 DIAGNOSIS — K3184 Gastroparesis: Secondary | ICD-10-CM

## 2014-02-13 DIAGNOSIS — Z9889 Other specified postprocedural states: Secondary | ICD-10-CM

## 2014-02-13 DIAGNOSIS — Z903 Acquired absence of stomach [part of]: Secondary | ICD-10-CM

## 2014-02-13 MED ORDER — PROMETHAZINE HCL 25 MG PO TABS: ORAL_TABLET | ORAL | Status: DC

## 2014-02-13 MED ORDER — SUCRALFATE 1 GM/10ML PO SUSP: ORAL | Status: DC

## 2014-02-13 MED ORDER — ESOMEPRAZOLE MAGNESIUM 40 MG PO CPDR: 40.0000 mg | DELAYED_RELEASE_CAPSULE | ORAL | Status: DC

## 2014-02-13 NOTE — Patient Instructions (Addendum)
You have been scheduled for an endoscopy. Please follow written instructions given to you at your visit today. If you use inhalers (even only as needed), please bring them with you on the day of your procedure. Your physician has requested that you go to www.[email or web address].com and enter the access code given to you at your visit today. This web site gives a general overview about your procedure. However, you should still follow specific instructions given to you by our office regarding your preparation for the procedure.  Please discontinue Dexilant.  We have sent the following medications to your pharmacy for you to pick up at your convenience: Nexium 40 mg every day Carafate 10 ml twice daily Phenergan  CC: Dr Jeanie Cooks
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
## 2014-02-21 ENCOUNTER — Encounter: Payer: Self-pay | Admitting: Internal Medicine

## 2014-02-21 ENCOUNTER — Ambulatory Visit (AMBULATORY_SURGERY_CENTER): Payer: PRIVATE HEALTH INSURANCE | Admitting: Internal Medicine

## 2014-02-21 VITALS — BP 179/91 | HR 79 | Temp 96.4°F | Resp 24 | Ht 64.0 in | Wt 122.0 lb

## 2014-02-21 DIAGNOSIS — R633 Feeding difficulties, unspecified: Secondary | ICD-10-CM

## 2014-02-21 DIAGNOSIS — K2270 Barrett's esophagus without dysplasia: Secondary | ICD-10-CM

## 2014-02-21 DIAGNOSIS — R1314 Dysphagia, pharyngoesophageal phase: Secondary | ICD-10-CM

## 2014-02-21 LAB — GLUCOSE, CAPILLARY
Glucose-Capillary: 65 mg/dL — ABNORMAL LOW (ref 70–99)
Glucose-Capillary: 72 mg/dL (ref 70–99)
Glucose-Capillary: 99 mg/dL (ref 70–99)

## 2014-02-21 MED ORDER — CIPROFLOXACIN HCL 250 MG PO TABS: ORAL_TABLET | ORAL | Status: DC

## 2014-02-21 MED ORDER — SODIUM CHLORIDE 0.9 % IV SOLN: 500.0000 mL | INTRAVENOUS | Status: DC

## 2014-02-21 NOTE — Patient Instructions (Addendum)
YOU HAD AN ENDOSCOPIC PROCEDURE TODAY AT [facility]: Refer to the procedure report that was given to you for any specific questions about what was found during the examination.  If the procedure report does not answer your questions, please call your gastroenterologist to clarify.  If you requested that your care partner not be given the details of your procedure findings, then the procedure report has been included in a sealed envelope for you to review at your convenience later.  YOU SHOULD EXPECT: Some feelings of bloating in the abdomen. Passage of more gas than usual.  Walking can help get rid of the air that was put into your GI tract during the procedure and reduce the bloating.  DIET: Your first meal following the procedure should be a light meal and then it is ok to progress to your normal diet.  A half-sandwich or bowl of soup is an example of a good first meal.  Heavy or fried foods are harder to digest and may make you feel nauseous or bloated.  Likewise meals heavy in dairy and vegetables can cause extra gas to form and this can also increase the bloating.  Drink plenty of fluids but you should avoid alcoholic beverages for 24 hours.  ACTIVITY: Your care partner should take you home directly after the procedure.  You should plan to take it easy, moving slowly for the rest of the day.  You can resume normal activity the day after the procedure however you should NOT DRIVE or use heavy machinery for 24 hours (because of the sedation medicines used during the test).    SYMPTOMS TO REPORT IMMEDIATELY: A gastroenterologist can be reached at any hour.  During normal business hours, 8:30 AM to 5:00 PM Monday through Friday, call [phone].  After hours and on weekends, please call the GI answering service at [phone] who will take a message and have the physician on call contact you.    Following upper endoscopy (EGD)  Vomiting of blood or coffee ground material  New  chest pain or pain under the shoulder blades  Painful or persistently difficult swallowing  New shortness of breath  Fever of 100F or higher  Black, tarry-looking stools  FOLLOW UP: If any biopsies were taken you will be contacted by phone or by letter within the next 1-3 weeks.  Call your gastroenterologist if you have not heard about the biopsies in 3 weeks.  Our staff will call the home number listed on your records the next business day following your procedure to check on you and address any questions or concerns that you may have at that time regarding the information given to you following your procedure. This is a courtesy call and so if there is no answer at the home number and we have not heard from you through the emergency physician on call, we will assume that you have returned to your regular daily activities without incident.  SIGNATURES/CONFIDENTIALITY: You and/or your care partner have signed paperwork which will be entered into your electronic medical record.  These signatures attest to the fact that that the information above on your After Visit Summary has been reviewed and is understood.  Full responsibility of the confidentiality of this discharge information lies with you and/or your care-partner.  Await pathology  Please read over handout about anti-reflux regimen  Continue Carafate, Reglan, and Nexium  Drink Resource two a day  Your Cipro prescription was sent to [facility]

## 2014-02-21 NOTE — Progress Notes (Signed)
Called to room to assist during endoscopic procedure.  Patient ID and intended procedure confirmed with present staff. Received instructions for my participation in the procedure from the performing physician.  

## 2014-02-21 NOTE — Progress Notes (Signed)
Pt requires extensive care while in the RR

## 2014-02-21 NOTE — Op Note (Signed)
[place]  [address] [place], [id number]   ENDOSCOPY PROCEDURE REPORT  PATIENT: Emily, [name]  MR#: [id number] BIRTHDATE: Nov 05, 1940 , 73  yrs. old GENDER: female ENDOSCOPIST: Lafayette Dragon, MD REFERRED BY:  Nolene Ebbs, M.D. PROCEDURE DATE:  02/21/2014 PROCEDURE:  EGD w/ biopsy ASA CLASS:     Class III INDICATIONS:  dysphagia and story of gastroesophageal reflux. Failed Nissen fundoplication.  Status post Belsey gastropexy. Status post subtotal gastrectomy and esophagojejunostomy for severe gastroparesos.  gastroparesisjejunal feeding tube discontinued one year agofailure to thrive.  Continued weight loss.  Last upper endoscopy August 2014. MEDICATIONS: Monitored anesthesia care and Propofol 110 mg IV TOPICAL ANESTHETIC: none  DESCRIPTION OF PROCEDURE: After the risks benefits and alternatives of the procedure were thoroughly explained, informed consent was obtained.  The LB [id number] endoscope was introduced through the mouth and advanced to the second portion of the duodenum , Without limitations.  The instrument was slowly withdrawn as the mucosa was fully examined.    Esophagus: Valeculea  and piriform sinuses were erythematous. Esophagus showed decreased motility. It was thickened but there were no erosions or active esophagitis. There was small amount of retained saliva in the esophagus. Esophago- jejunal anastomosis was located at 32 cm from the incisors. It was widely patent. There was fibrous tissue and granulation tissue within the anastomosis which was biopsied to rule out Barrett's esophagus Stomach: there was essentially no gastric remnant. The anastomosis was at 32 cm and was a soft 5 go jejunal. Efferent  limb of the jejunum was examined and  there was no obstruction or retained secretions.          The scope was then withdrawn from the patient and the procedure completed. Jejunum: Efferent limb of  the jejunum was examined  over the length of at least 20 cm and appeared normal  COMPLICATIONS: There were no immediate complications.  ENDOSCOPIC IMPRESSION:  1.thickened esophagus with decreased motility and retained secretions consistent with presbyesophagus 2. Widely patent esophagojejunal anastomosis at 32 cm 3. status post remote total gastrectomy. Status post biopsies from esophagojejunal anastomosis  RECOMMENDATIONS: 1.  Await pathology results 2.  Anti-reflux regimen to be follow 3. continue PPI and Carafate as well as Reglan Consider re-institution of the jejunal feeding tube if weight continues  REPEAT EXAM: for EGD pending biopsy results.  eSigned:  Lafayette Dragon, MD 02/21/2014 2:50 PM    CC:  PATIENT NAME:  Emily, [name] MR#: [id number]

## 2014-02-21 NOTE — Progress Notes (Signed)
Report to PACU, RN, vss, BBS= Clear.  

## 2014-02-22 ENCOUNTER — Telehealth: Payer: Self-pay | Admitting: *Deleted

## 2014-02-22 NOTE — Telephone Encounter (Signed)
  Follow up Call-  Call back number 02/21/2014 12/07/2012 05/17/2012 02/15/2012  Post procedure Call Back phone  # [phone]  Permission to leave phone message Yes Yes Yes No     Patient questions:  The number given has been disconnected.

## 2014-02-27 ENCOUNTER — Encounter: Payer: Self-pay | Admitting: Internal Medicine

## 2014-03-02 IMAGING — XA IR REPLC DUODEN/JEJUNO TUBE PERCUT W/FLUORO
1 series · 2 of 2 positions shown · non-contrast
Comparison: Fluoroscopic guided jejunostomy tube exchange -
06/15/2012;

INDICATION: Hole within the jejunostomy tube.

FLUROSCOPIC GUIDED REPLACEMENT OF JEJUNOSTOMY TUBE

[Series 300: ir radiologist eval & mgmt · 2 of 2 slices shown]
[im 1/2]
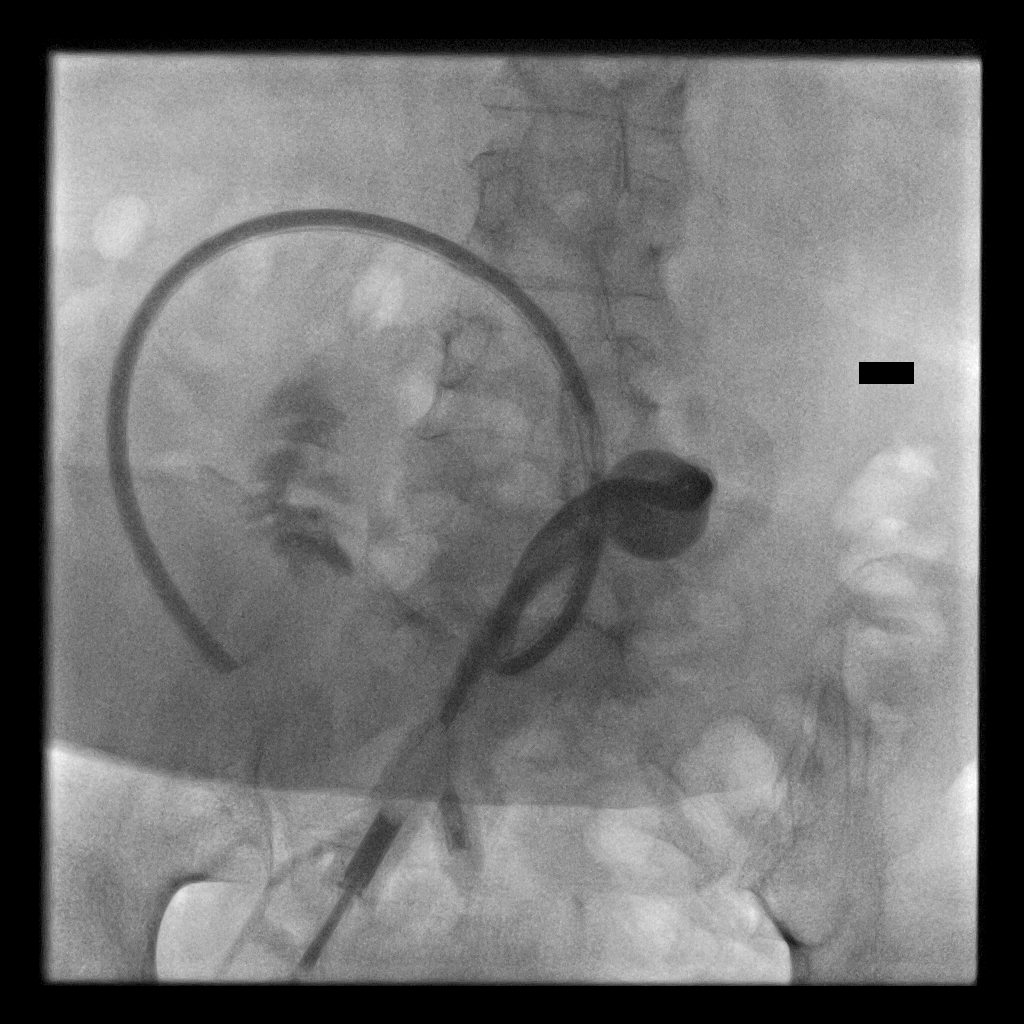
[im 2/2]
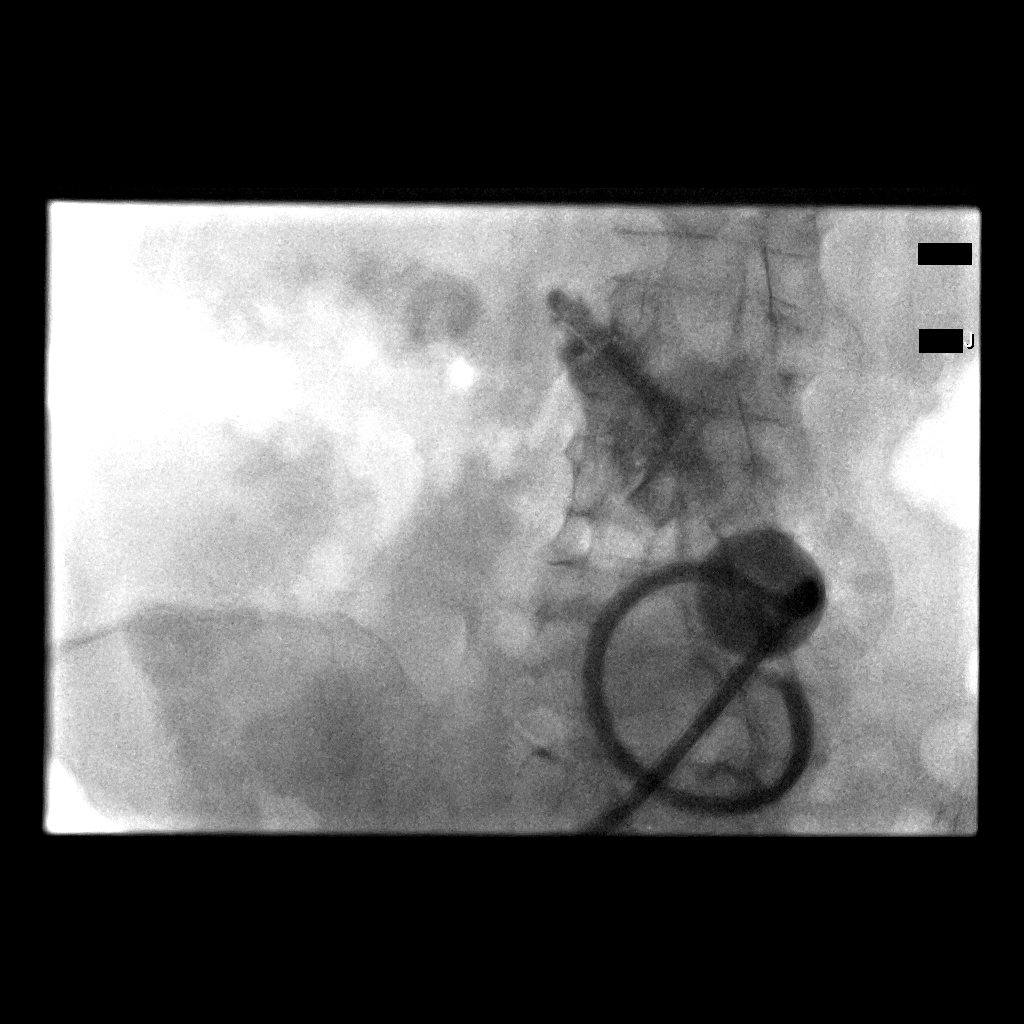

[2 of 2 positions shown; findings below may reference images not displayed]

02/15/2012

Medications: None

Contrast: 10 ml Cmnipaque-UKK, administered enterically

Fluoroscopy Time: 2 minutes.

Complications: None immediate

Technique / Findings:

Informed written consent was obtained from the patient after a
discussion of the risks, benefits and alternatives to treatment.
Questions regarding the procedure were encouraged and answered.  A
timeout was performed prior to the initiation of the procedure.

The external portion of the existing jejunostomy tube was prepped
and draped in the usual sterile fashion, and a sterile drape was
applied covering the operative field.  Maximum barrier sterile
technique with sterile gowns and gloves were used for the
procedure.  A timeout was performed prior to the initiation of the
procedure.

A small bowel contrast was injected via the existing jejunostomy
tube demonstrates appropriate positioning within the mid small
bowel.

Existing jejunostomy tube was cut and cannulated with a stiff
Glidewire which was coiled within the jejunum.  Approximately 15 cm
of the distal end of the jejunostomy catheter was cut.   Under
intermittent fluoroscopic guidance, the existing jejunostomy
catheter was exchanged for a new 20 French balloon retention
jejunostomy tube.  The retention balloon was inflated and the
balloon was cinched.  Contrast was injected and several spot
fluoroscopic images were obtained in various obliquities confirming
intraluminal postioning.  The patient tolerated the procedure well
without immediate postprocedural complication.
IMPRESSION: Successful fluoroscopic guided placement of a new 20-French
jejunostomy tube.  The new jejunostomy tube is ready for immediate
use.

## 2014-03-07 ENCOUNTER — Other Ambulatory Visit: Payer: Self-pay | Admitting: *Deleted

## 2014-03-07 ENCOUNTER — Telehealth: Payer: Self-pay | Admitting: Internal Medicine

## 2014-03-07 DIAGNOSIS — R633 Feeding difficulties, unspecified: Secondary | ICD-10-CM

## 2014-03-07 NOTE — Telephone Encounter (Signed)
Patient notified of recommendations. Labs in EPIC. Patient will come for labs.

## 2014-03-07 NOTE — Telephone Encounter (Signed)
Please use Carnation breakfast in place of Ensure. Also, have her come for c-met, CBC,Iron studies, B12., prealbumin

## 2014-03-07 NOTE — Telephone Encounter (Signed)
Spoke with patient and explained biopsy results. Mailed patient information on Barrett's esophagus. Patient also states she vomited x 2 on Saturday. States once it was when she was taking pills and drinking water. The second time was after eating. She has not vomited since Saturday. She is not having the heavy feeling in her chest but still feels like food is not going down. She states she cannot afford Ensure or Boost twice a day. Please, advise.

## 2014-03-09 ENCOUNTER — Other Ambulatory Visit (INDEPENDENT_AMBULATORY_CARE_PROVIDER_SITE_OTHER): Payer: PRIVATE HEALTH INSURANCE

## 2014-03-09 DIAGNOSIS — D509 Iron deficiency anemia, unspecified: Secondary | ICD-10-CM | POA: Diagnosis not present

## 2014-03-09 DIAGNOSIS — R633 Feeding difficulties, unspecified: Secondary | ICD-10-CM

## 2014-03-09 LAB — COMPREHENSIVE METABOLIC PANEL
ALT: 37 U/L — ABNORMAL HIGH (ref 0–35)
AST: 37 U/L (ref 0–37)
Albumin: 3.5 g/dL (ref 3.5–5.2)
Alkaline Phosphatase: 113 U/L (ref 39–117)
BUN: 11 mg/dL (ref 6–23)
CO2: 22 mEq/L (ref 19–32)
Calcium: 8.9 mg/dL (ref 8.4–10.5)
Chloride: 108 mEq/L (ref 96–112)
Creatinine, Ser: 0.6 mg/dL (ref 0.4–1.2)
GFR: 136.2 mL/min (ref >60.00)
Glucose, Bld: 96 mg/dL (ref 70–99)
Potassium: 3.8 mEq/L (ref 3.5–5.1)
Sodium: 142 mEq/L (ref 135–145)
Total Bilirubin: 0.4 mg/dL (ref 0.2–1.2)
Total Protein: 7.2 g/dL (ref 6.0–8.3)

## 2014-03-09 LAB — IBC PANEL
Iron: 89 ug/dL (ref 42–145)
Saturation Ratios: 28.9 % (ref 20.0–50.0)
Transferrin: 220.2 mg/dL (ref 212.0–360.0)

## 2014-03-09 LAB — CBC WITH DIFFERENTIAL/PLATELET
Basophils Absolute: 0 10*3/uL (ref 0.0–0.1)
Basophils Relative: 0.4 % (ref 0.0–3.0)
Eosinophils Absolute: 0.1 10*3/uL (ref 0.0–0.7)
Eosinophils Relative: 1 % (ref 0.0–5.0)
HCT: 40.2 % (ref 36.0–46.0)
Hemoglobin: 12.2 g/dL (ref 12.0–15.0)
Lymphocytes Relative: 32.2 % (ref 12.0–46.0)
Lymphs Abs: 2.3 10*3/uL (ref 0.7–4.0)
MCHC: 30.4 g/dL (ref 30.0–36.0)
MCV: 78 fl (ref 78.0–100.0)
Monocytes Absolute: 0.5 10*3/uL (ref 0.1–1.0)
Monocytes Relative: 6.8 % (ref 3.0–12.0)
Neutro Abs: 4.2 10*3/uL (ref 1.4–7.7)
Neutrophils Relative %: 59.6 % (ref 43.0–77.0)
Platelets: 212 10*3/uL (ref 150.0–400.0)
RBC: 5.16 Mil/uL — ABNORMAL HIGH (ref 3.87–5.11)
RDW: 15.3 % (ref 11.5–15.5)
WBC: 7.1 10*3/uL (ref 4.0–10.5)

## 2014-03-09 LAB — VITAMIN B12: Vitamin B-12: 87 pg/mL — ABNORMAL LOW (ref 211–911)

## 2014-03-10 LAB — PREALBUMIN: Prealbumin: 19.4 mg/dL (ref 17.0–34.0)

## 2014-03-11 IMAGING — CT CT ABD-PELV W/ CM
2 of 5 series · 15 of 46 positions shown, 17 images · IV contrast (APPLIED)
Comparison: CT abdomen pelvis with contrast 07/02/2012.  CT chest
08/15/2012

CLINICAL DATA: Abdominal pain.

CT ABDOMEN AND PELVIS WITH CONTRAST
TECHNIQUE: Multidetector CT imaging of the abdomen and pelvis was
performed following the standard protocol during bolus
administration of intravenous contrast.
Contrast: 100mL OMNIPAQUE IOHEXOL 300 MG/ML  SOLN

[Series 2: abd/pelv with 5.0 b31f st · axial · 0.70mm/px · z∈[+712,+1082]mm · 12 of 84 slices shown, 14 images]
[im 5/84  soft-tissue]
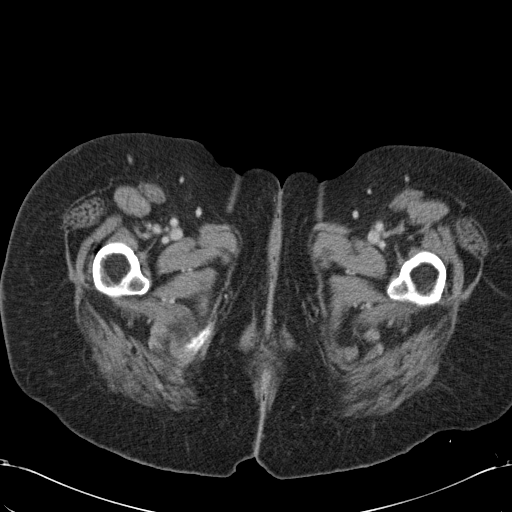
[im 5/84  bone]
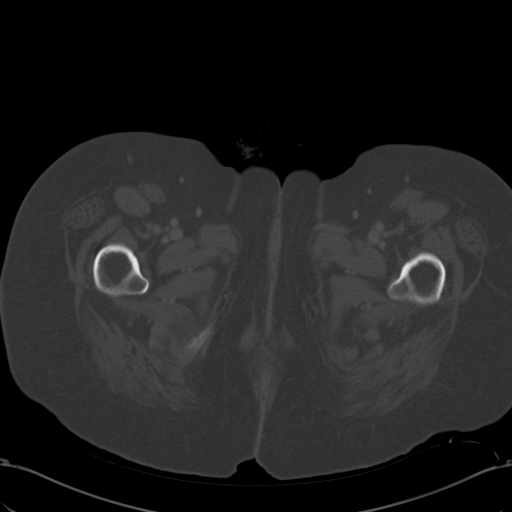
[im 13/84  soft-tissue]
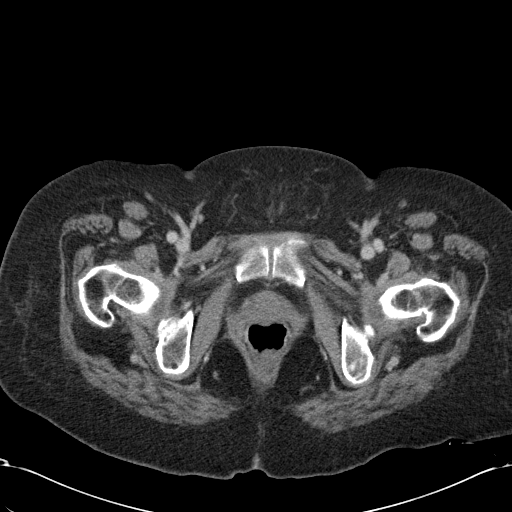
[im 17/84  soft-tissue]
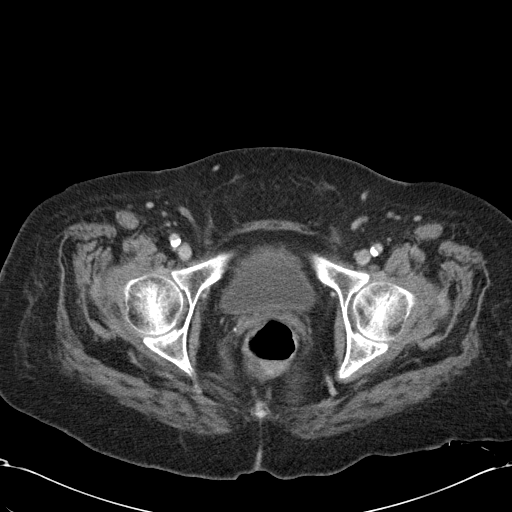
[im 25/84  soft-tissue]
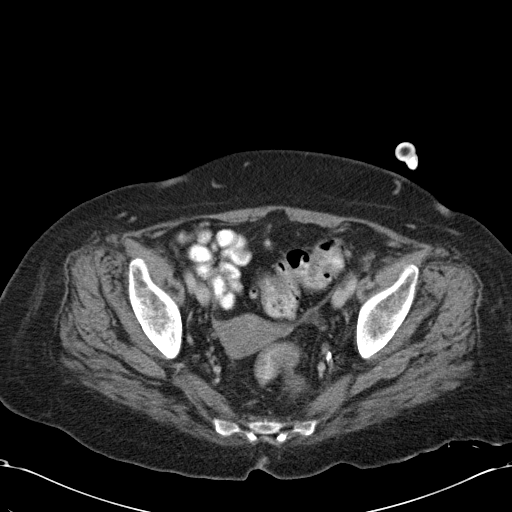
[im 34/84  soft-tissue]
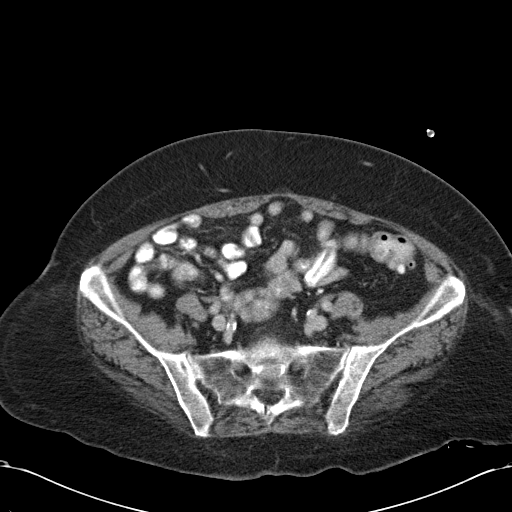
[im 38/84  soft-tissue]
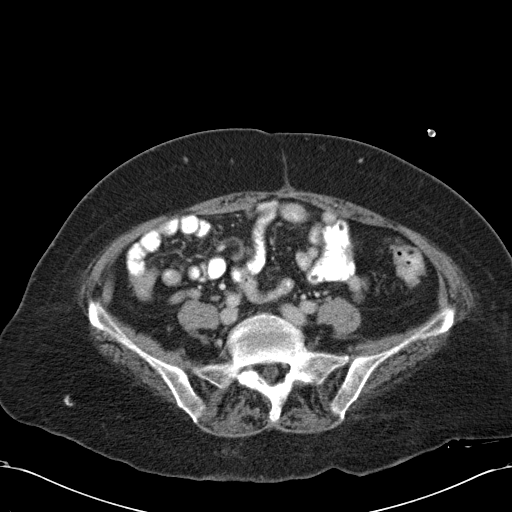
[im 46/84  soft-tissue]
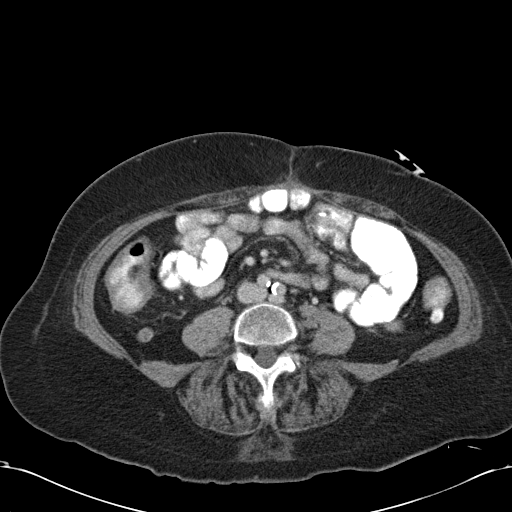
[im 50/84  soft-tissue]
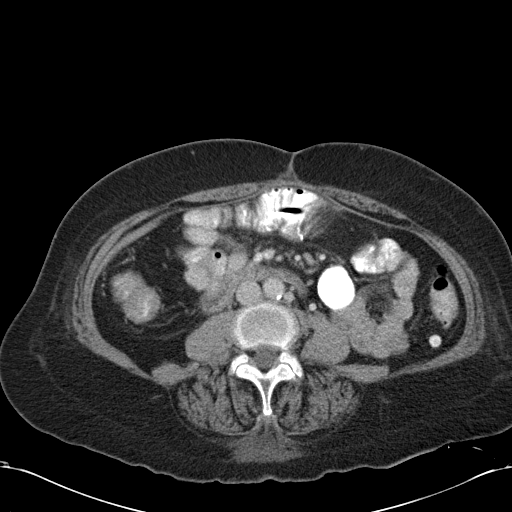
[im 59/84  soft-tissue]
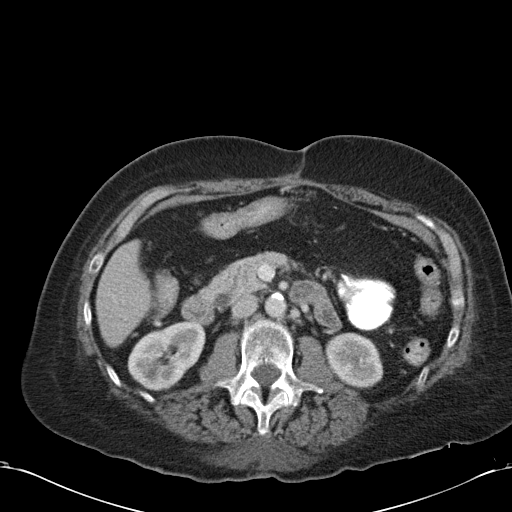
[im 59/84  bone]
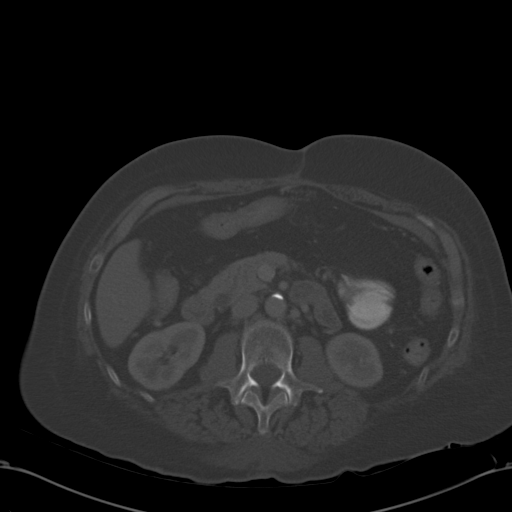
[im 67/84  soft-tissue]
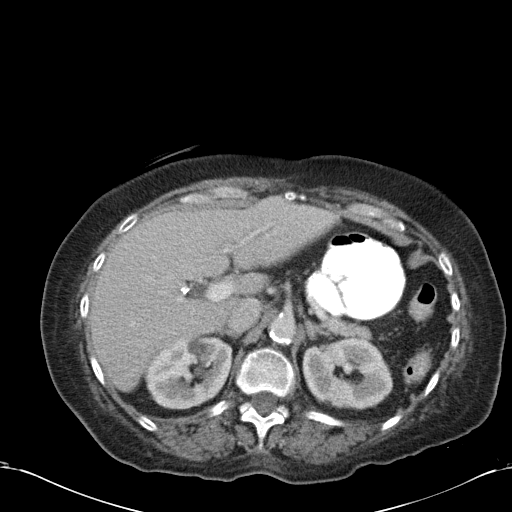
[im 71/84  soft-tissue]
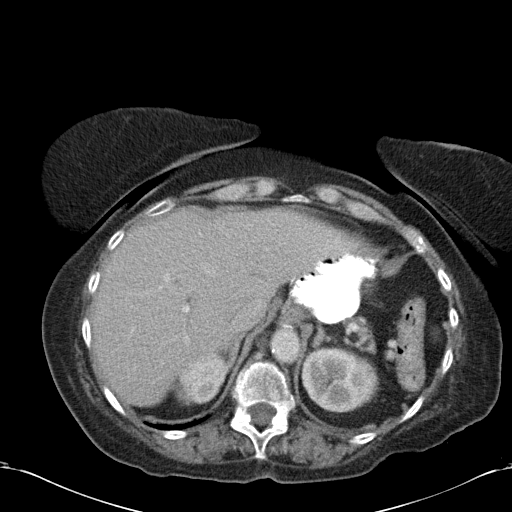
[im 79/84  soft-tissue]
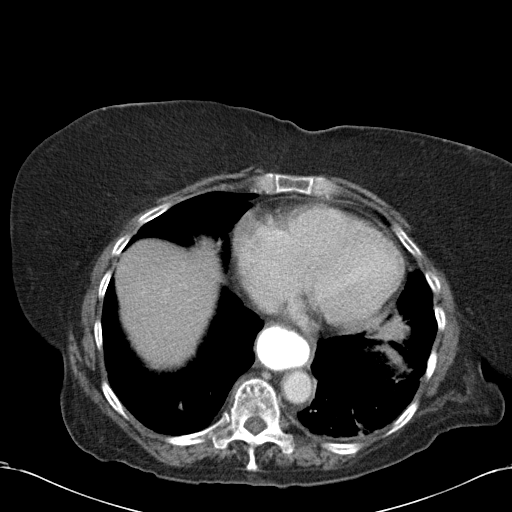

[Series 5: coronals · coronal · 0.90mm/px · 3 of 106 slices shown]
[im 36/106  soft-tissue]
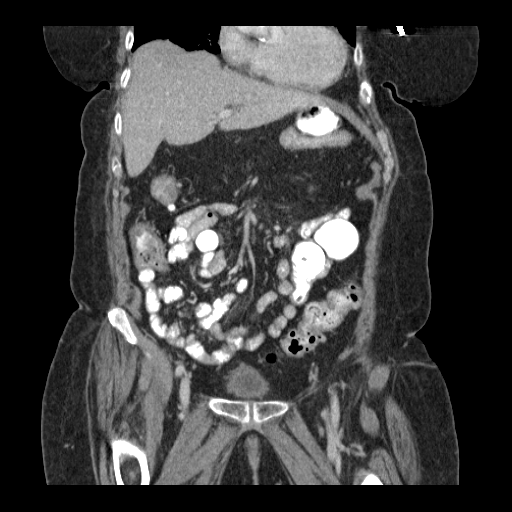
[im 47/106  soft-tissue]
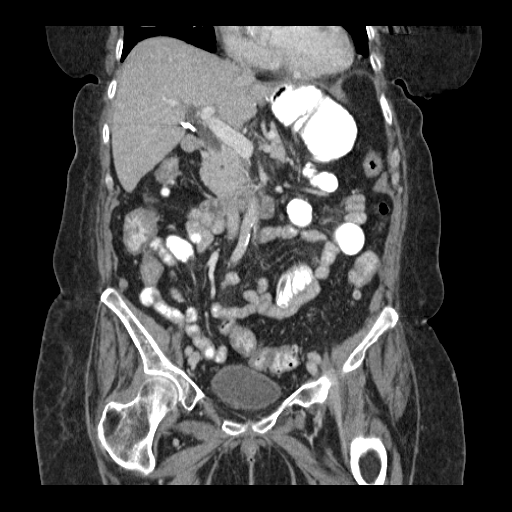
[im 59/106  soft-tissue]
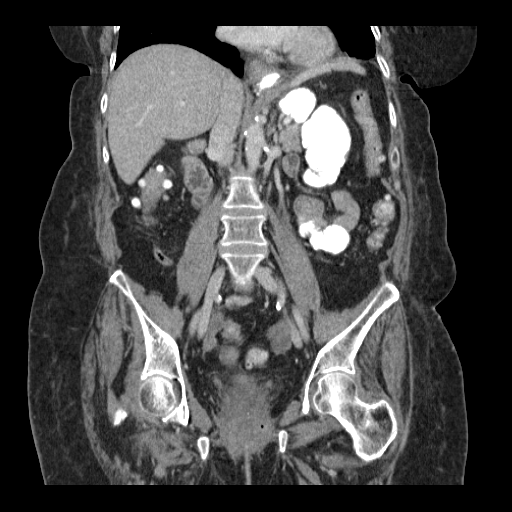

[15 of 46 positions shown; findings below may reference images not displayed]

FINDINGS: Small opacity peripherally in the inferior aspect of the
right middle lobe likely corresponds to the previously described
right middle lobe pneumonia on chest CT of 08/15/2012.  Although
the entire right middle lobe is not imaged today, this have the
appearance of the imaged portion appears to demonstrate improved
aeration.  Small opacity seen in the left lower lobe on image #1.
This region of the left lower lobe was previously clear.  Mild, new
focal opacities in the right lower lobe.

The distal esophagus is patulous/distended, and filled with oral
contrast.  Where imaged, the distal esophageal measures 4.7 cm
transverse diameter by 2.7 cm AP diameter.

Postsurgical changes of gastrectomy with an anastomosis of the
distal esophagus with  jejunum.  This proximal jejunal loop at the
level of the anastomosis is opacified with oral contrast and
distended up to 5.2 cm on image number 21.  Distal to this, small
bowel loops are normal in caliber.

A jejunostomy tube in the left abdomen appears stable in position
compared to the CT of the abdomen on July 2012.

There is extensive colonic diverticulosis.  No acute inflammatory
changes in association with the diverticula.  The appendix is
visualized.  The distal tip of the appendix measures up to 8 mm in
diameter.  This appears similar to the CT of July 2012.  Gas is
seen within the lumen of the appendix in its mid aspect.  There are
no associated periappendiceal inflammatory changes.  No definite
evidence of acute appendicitis.

Liver is within normal limits for size.  No focal hepatic lesion.
Patient is status post cholecystectomy.  Prominence of the common
bile duct is stable.  Common bile duct tapers distally, and was
recently evaluated with MRCP (08/15/2012).  No filling defects are
seen within the dilated common bile duct (measures up to
approximately 11 mm).

The pancreas, spleen, adrenal glands, and left kidney within normal
limits.  Stable 10 mm cyst upper pole right kidney.  Is stable too
small to characterize low density lesions lower pole right kidney.
Negative for hydronephrosis or perinephric stranding.

The uterus and adnexa are within normal limits.

Urinary bladder not very distended and unremarkable.

Negative for lymphadenopathy, ascites, or free air.  Abdominal
aorta normal in caliber and demonstrates atherosclerotic changes.

No acute osseous abnormality.
IMPRESSION: 1. Status post gastrectomy.  Just beyond the anastomosis of the
distal esophagus and jejunum, proximal jejunal loop is dilated to 5
cm.  It is difficult to exclude partial obstruction of the proximal
jejunum. A follow-up upper GI examination may be useful.
2.  Dilated patulous distal esophagus appears similar to prior
studies.  Distal esophagus is distended with oral contrast.
Question if there is reflux of contrast from the proximal jejunum
and/or esophageal dysmotility.
3.  Patchy airspace opacities at the lung bases.  Overall, airspace
disease in the right middle lobe appears improved.  There are small
a few small new opacities in the lower lobes bilaterally.  Question
if the patient could be aspirating.
4.  Chronic dilatation of the common bile duct.  This was recently
evaluated with MRCP.  No filling defects are seen within the common
bile duct.
5.  Stable position of jejunostomy tube.
6.  The distal appendix measured at measures up to 8 mm, mildly
prominent.  This appears similar to a prior abdomen pelvis CT, and
there are no periappendiceal inflammatory changes. If there is any
clinical concern for acute appendicitis, short-term follow-up
imaging could be considered.
7.  Colonic diverticulosis, without acute inflammatory changes.

## 2014-03-12 ENCOUNTER — Telehealth: Payer: Self-pay | Admitting: Internal Medicine

## 2014-03-12 ENCOUNTER — Other Ambulatory Visit: Payer: Self-pay | Admitting: *Deleted

## 2014-03-12 ENCOUNTER — Encounter: Payer: Self-pay | Admitting: *Deleted

## 2014-03-12 DIAGNOSIS — E538 Deficiency of other specified B group vitamins: Secondary | ICD-10-CM

## 2014-03-12 MED ORDER — CYANOCOBALAMIN 1000 MCG/ML IJ SOLN: INTRAMUSCULAR | Status: DC

## 2014-03-12 NOTE — Telephone Encounter (Signed)
Patient states she her insurance will pay for her B12.

## 2014-03-13 ENCOUNTER — Encounter: Payer: PRIVATE HEALTH INSURANCE | Admitting: Internal Medicine

## 2014-03-13 IMAGING — CR DG ABDOMEN ACUTE W/ 1V CHEST
3 series · 3 of 3 positions shown · non-contrast
Comparison: Radiographs dated 08/12/2012

CLINICAL DATA: Abdominal pain.

ACUTE ABDOMEN SERIES (ABDOMEN 2 VIEW & CHEST 1 VIEW)

[w abdomen decub]
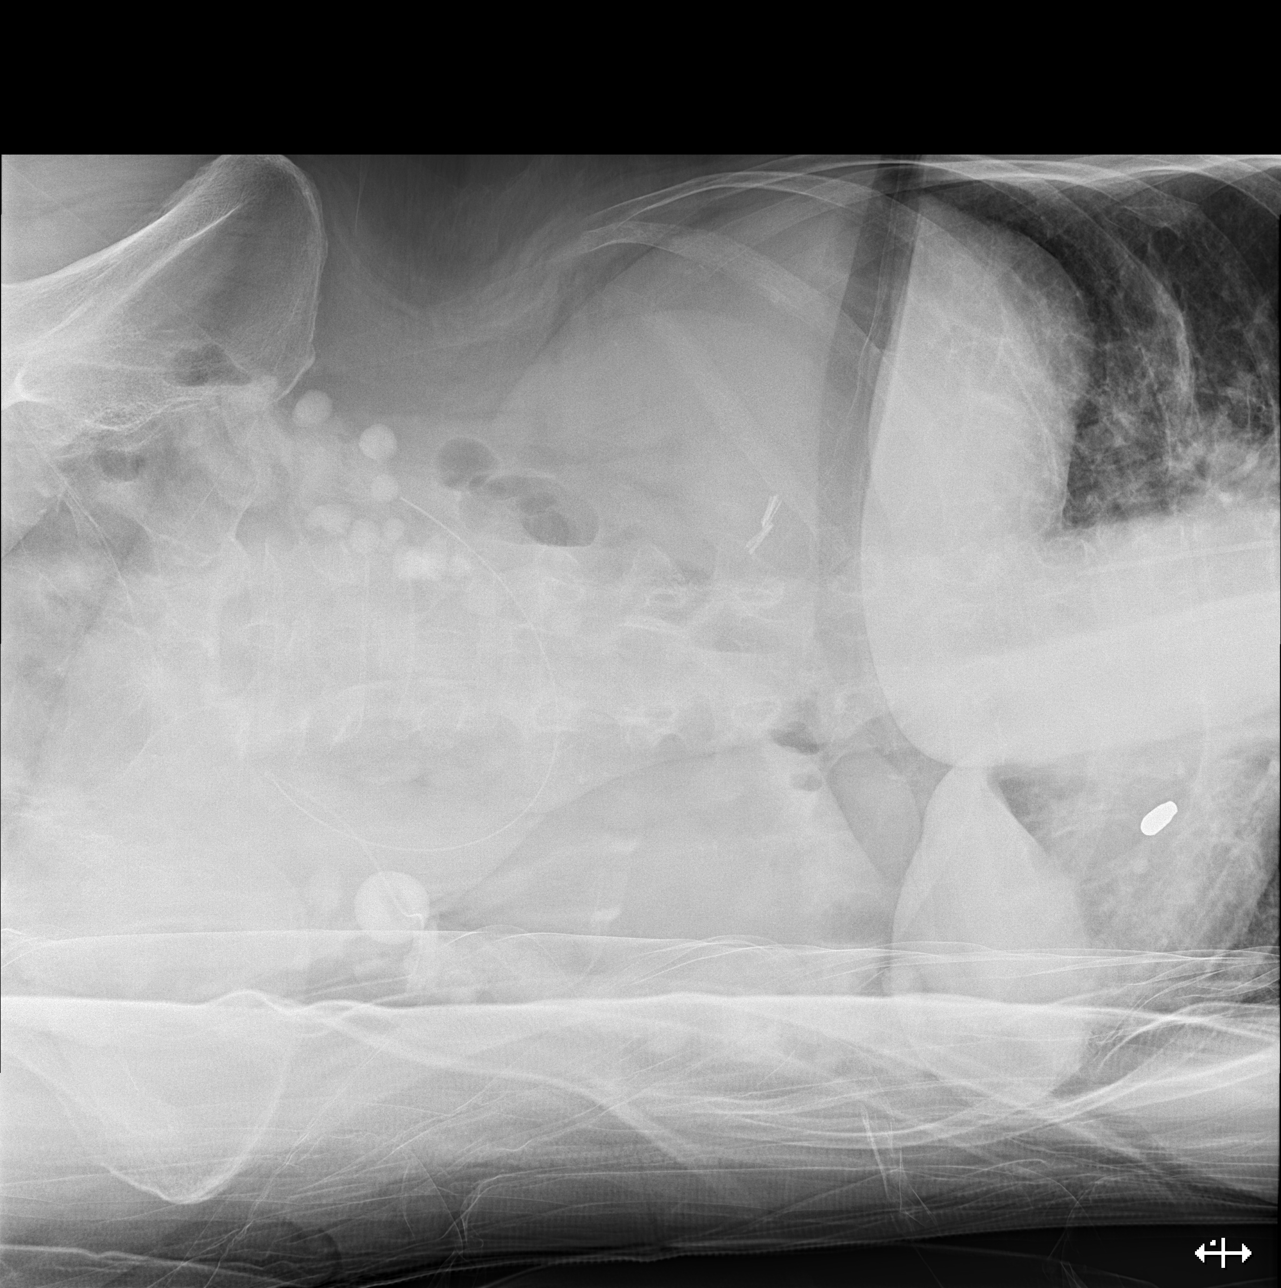

[t abdomen supine]
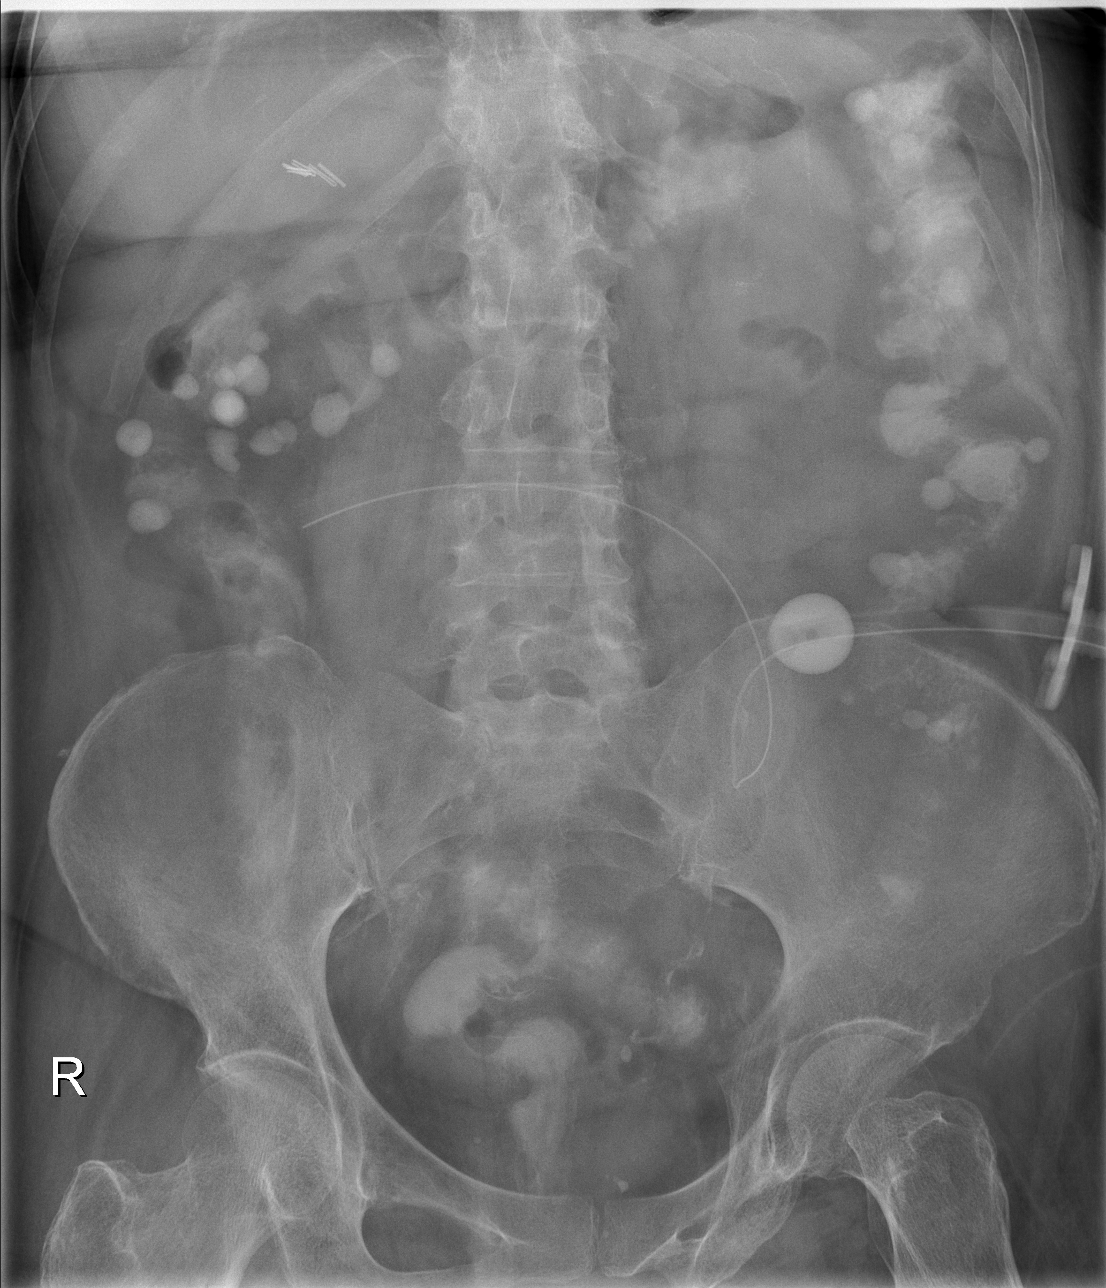

[t chest supine]
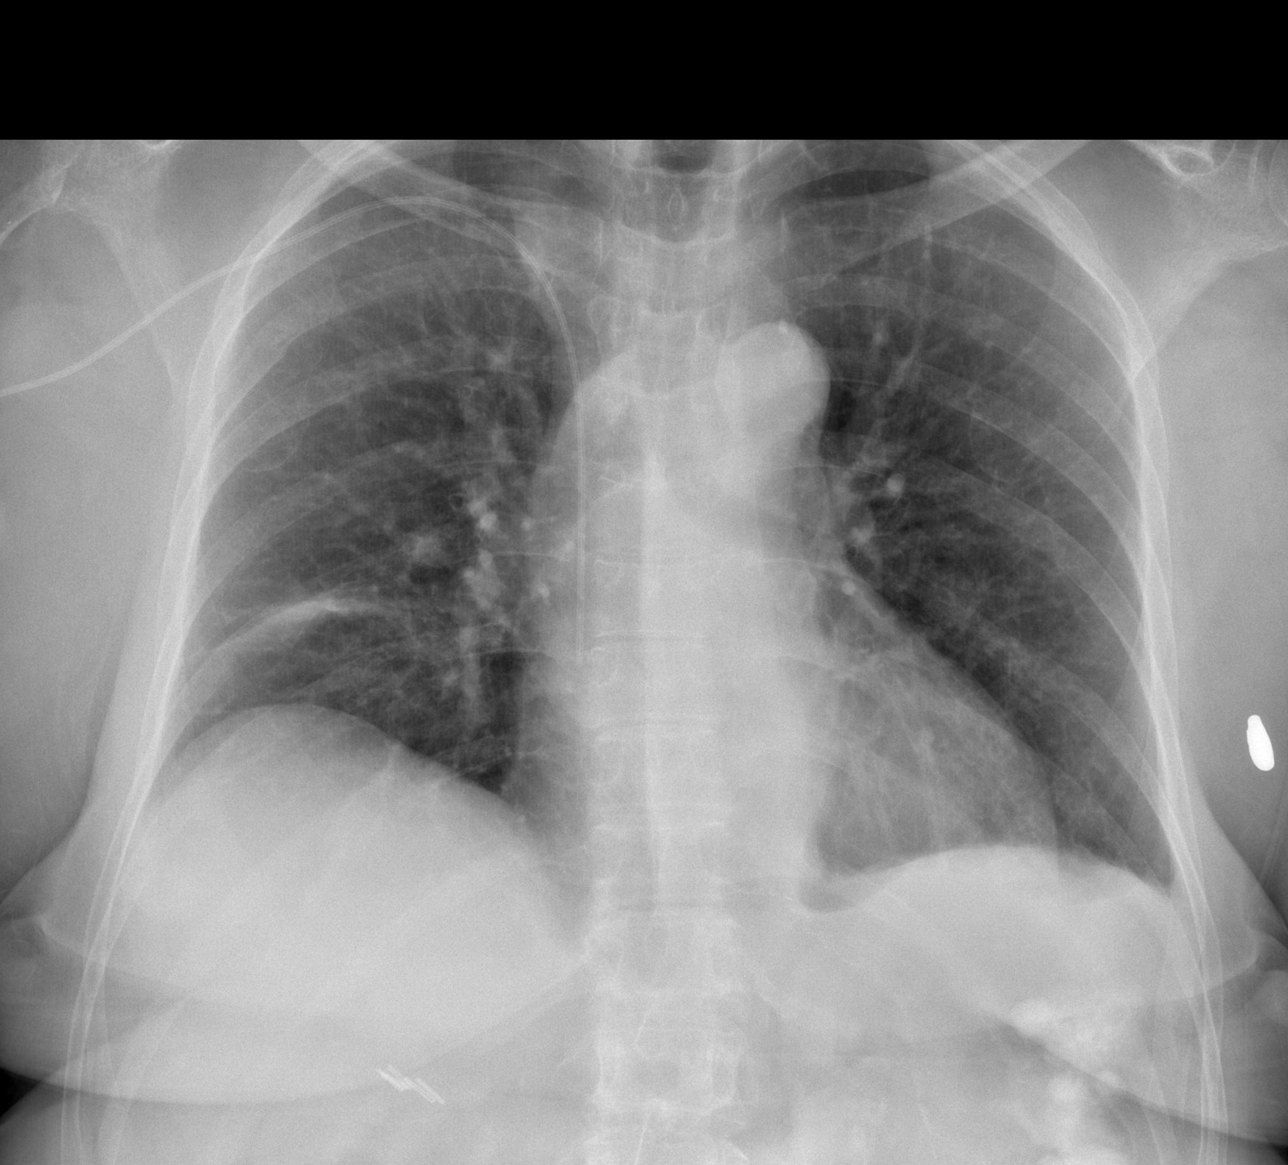

[3 of 3 positions shown; findings below may reference images not displayed]

FINDINGS: PICC line tip is at the cavoatrial junction in good
position. Almost complete clearing of the abnormality of the right
middle lobe since the prior chest x-ray.

There is a jejunostomy tube in place.  There is contrast throughout
the nondistended colon with multiple visible diverticula.  Evidence
of previous cholecystectomy and gastric surgery. No free air.

No dilated loops of large or small bowel.

No acute osseous abnormality.
IMPRESSION: Benign-appearing abdomen.  Diverticulosis.

## 2014-03-14 ENCOUNTER — Ambulatory Visit (INDEPENDENT_AMBULATORY_CARE_PROVIDER_SITE_OTHER): Payer: Self-pay | Admitting: Internal Medicine

## 2014-03-14 DIAGNOSIS — E538 Deficiency of other specified B group vitamins: Secondary | ICD-10-CM

## 2014-03-14 MED ORDER — CYANOCOBALAMIN 1000 MCG/ML IJ SOLN: INTRAMUSCULAR | Status: DC

## 2014-03-14 MED ORDER — CYANOCOBALAMIN 1000 MCG/ML IJ SOLN
1000.0000 ug | Freq: Once | INTRAMUSCULAR | Status: AC
  Administered 2014-03-14: 1000 ug via INTRAMUSCULAR

## 2014-03-14 NOTE — Addendum Note (Signed)
Addended by: Larina Bras on: 03/14/2014 02:32 PM   Modules accepted: Orders

## 2014-03-14 NOTE — Progress Notes (Signed)
Patient was instructed how to give herself the B12 injection since transportation is an issue.  Quintin Alto, CMA transferred her B12 rx to [facility] as requested.  Patient was given a calendar with the dates marked on it to give herself the rest of the B12's needed before her next blood drawn. No charge for today because patient used her own medication and supplies.

## 2014-03-15 ENCOUNTER — Telehealth: Payer: Self-pay | Admitting: Internal Medicine

## 2014-03-15 NOTE — Telephone Encounter (Signed)
Spoke with patient and she states the B12 medication was $85 for 7 vials. She could not afford all of it so she got 5 of them. She is worried that she cannot afford all of it. Discussed with patient and she is going to do the next 3 days then weekly x2. Could she go to monthly then? She states she does have some B12 pills also which she could take. Please, advise.

## 2014-03-20 NOTE — Telephone Encounter (Signed)
Yes, she can even go to monthly now after getting 2 shots. That will save her some money.

## 2014-03-20 NOTE — Telephone Encounter (Signed)
Patient notified of recommendations. 

## 2014-03-21 ENCOUNTER — Encounter: Payer: Self-pay | Admitting: Neurology

## 2014-03-27 ENCOUNTER — Encounter: Payer: Self-pay | Admitting: Neurology

## 2014-04-17 ENCOUNTER — Encounter: Payer: Self-pay | Admitting: Internal Medicine

## 2014-04-17 ENCOUNTER — Ambulatory Visit (INDEPENDENT_AMBULATORY_CARE_PROVIDER_SITE_OTHER): Payer: PRIVATE HEALTH INSURANCE | Admitting: Internal Medicine

## 2014-04-17 VITALS — BP 150/68 | HR 80 | Ht 64.0 in | Wt 124.2 lb

## 2014-04-17 DIAGNOSIS — K2100 Gastro-esophageal reflux disease with esophagitis, without bleeding: Secondary | ICD-10-CM

## 2014-04-17 DIAGNOSIS — E538 Deficiency of other specified B group vitamins: Secondary | ICD-10-CM

## 2014-04-17 DIAGNOSIS — Z9889 Other specified postprocedural states: Secondary | ICD-10-CM

## 2014-04-17 DIAGNOSIS — Z903 Acquired absence of stomach [part of]: Secondary | ICD-10-CM

## 2014-04-17 DIAGNOSIS — K3184 Gastroparesis: Secondary | ICD-10-CM

## 2014-04-17 DIAGNOSIS — Z980 Intestinal bypass and anastomosis status: Secondary | ICD-10-CM

## 2014-04-17 DIAGNOSIS — K224 Dyskinesia of esophagus: Secondary | ICD-10-CM

## 2014-04-17 MED ORDER — HYDROCODONE-ACETAMINOPHEN 5-325 MG PO TABS: 1.0000 | ORAL_TABLET | Freq: Four times a day (QID) | ORAL | Status: DC | PRN

## 2014-04-17 MED ORDER — SUCRALFATE 1 GM/10ML PO SUSP
ORAL | Status: DC
Start: 2014-04-17 — End: 2014-09-06

## 2014-04-17 MED ORDER — ESOMEPRAZOLE MAGNESIUM 40 MG PO CPDR: 40.0000 mg | DELAYED_RELEASE_CAPSULE | ORAL | Status: DC
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
## 2014-04-17 NOTE — Patient Instructions (Signed)
We have sent the following medications to your pharmacy for you to pick up at your convenience: carafate vicodin  CC:Dr Avbuere

## 2014-04-24 ENCOUNTER — Other Ambulatory Visit: Payer: Self-pay | Admitting: *Deleted

## 2014-04-24 MED ORDER — LORAZEPAM 0.5 MG PO TABS: ORAL_TABLET | ORAL | Status: DC

## 2014-05-01 IMAGING — CR DG UGI W/ SMALL BOWEL
6 series · 6 of 6 positions shown · non-contrast
Comparison: None

CLINICAL DATA: Jejunal feeding tube fell.  Assess small bowel

UPPER GI W/ SMALL BOWEL
TECHNIQUE: Upper GI series performed with high density barium and
effervescent agent. Thin barium also used.  Subsequently, serial
images of the small bowel were obtained including spot views of the
terminal ileum.
Fluoroscopy Time: 1 minute and 56 seconds

[run]
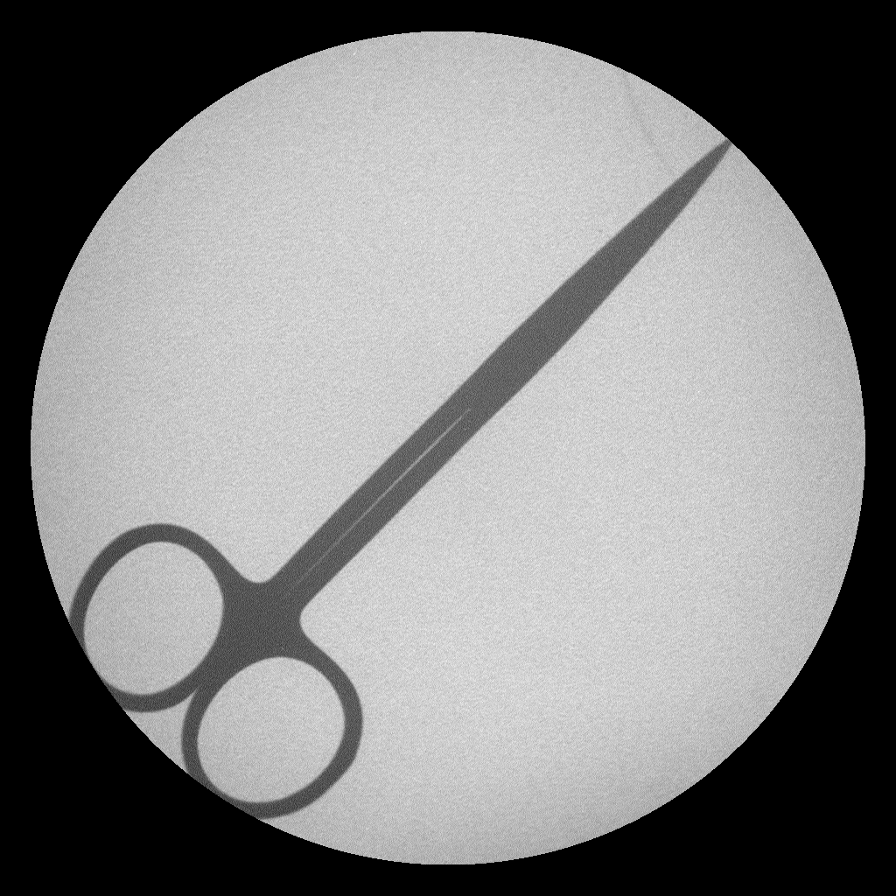

[view not recorded (1 of 5)]
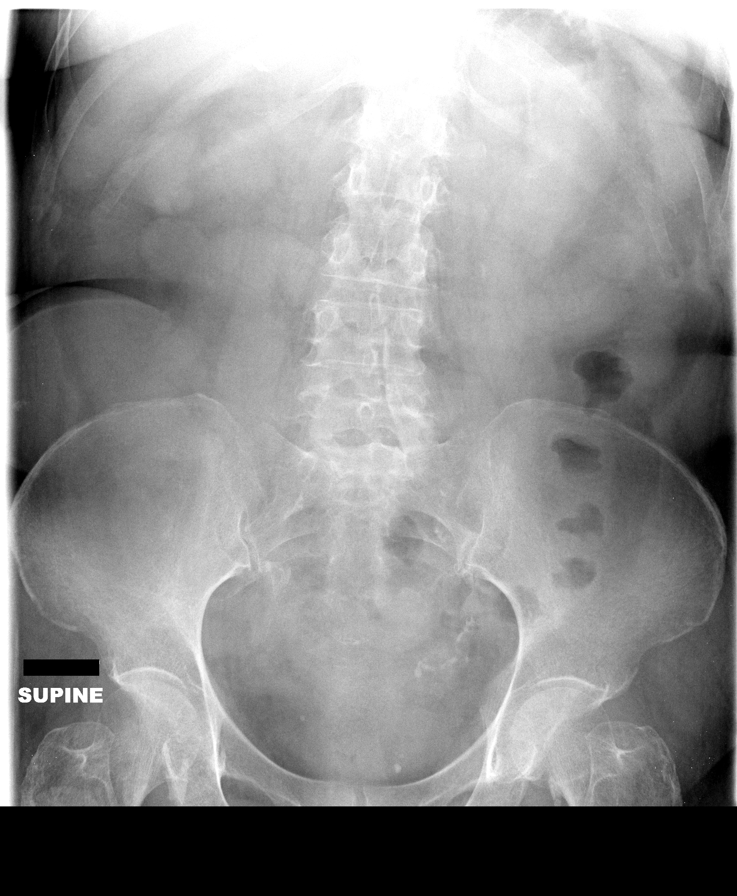

[view not recorded (2 of 5)]
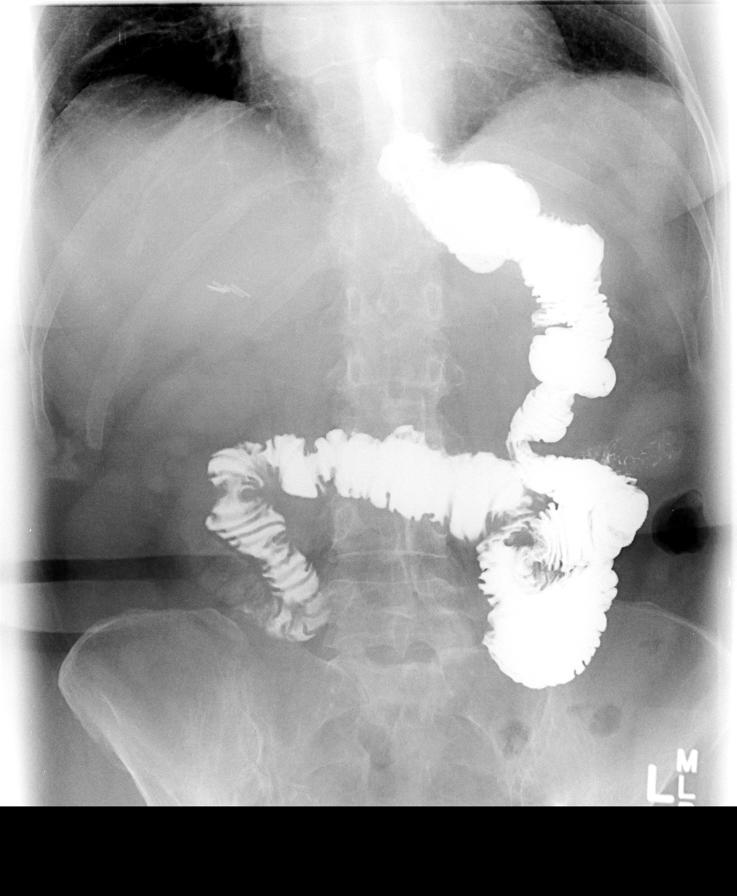

[view not recorded (3 of 5)]
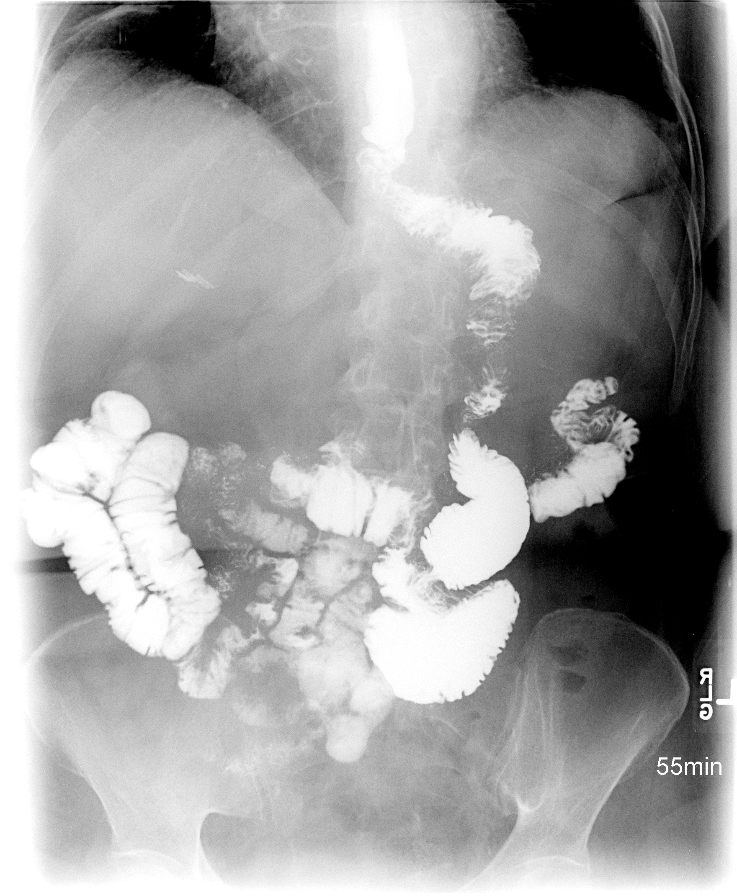

[view not recorded (4 of 5)]
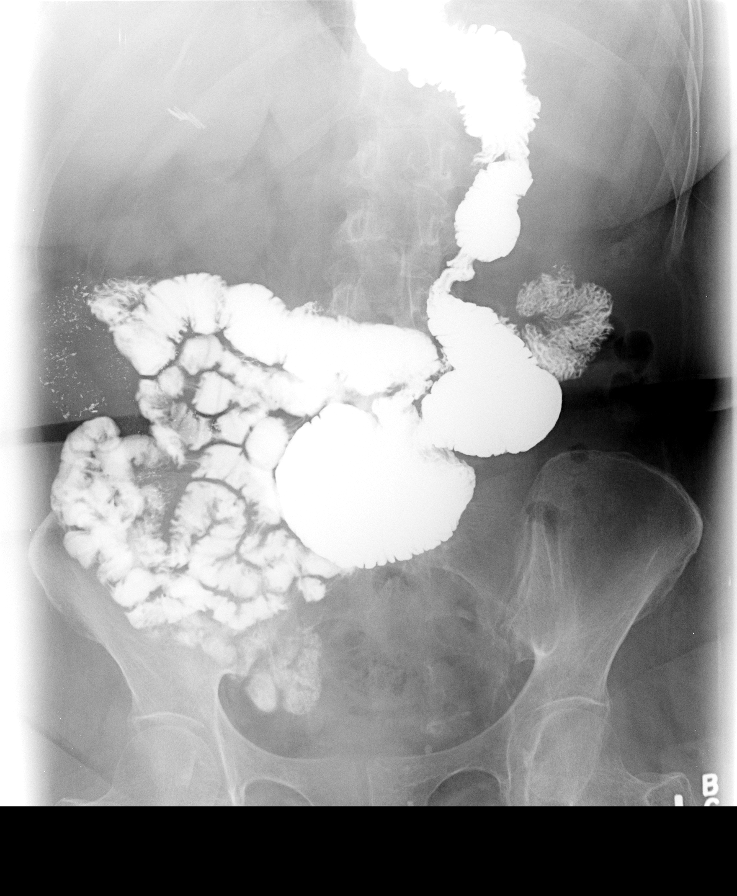

[view not recorded (5 of 5)]
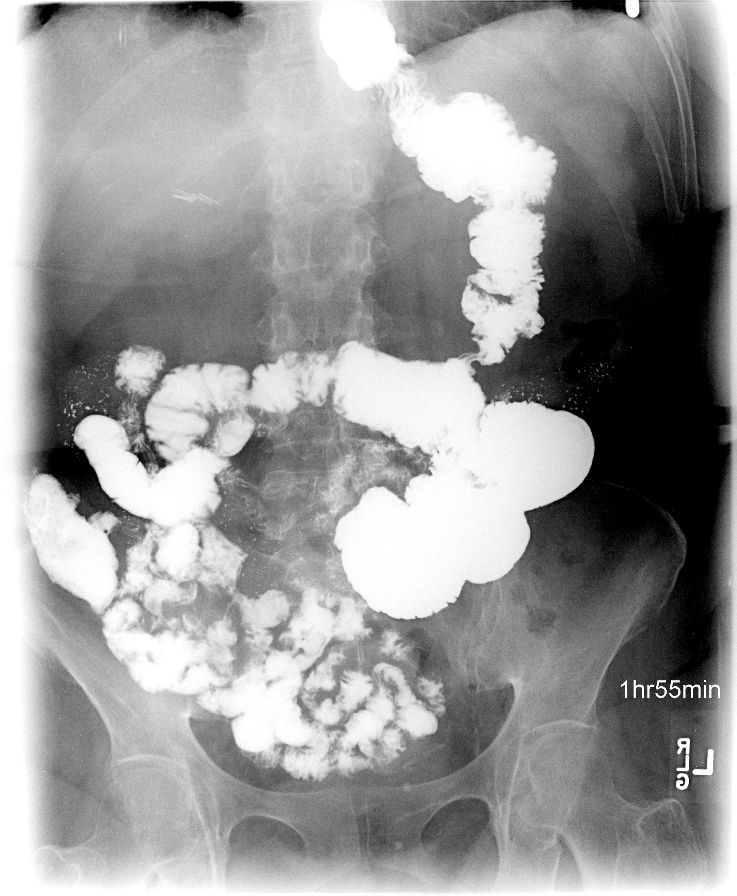

[6 of 6 positions shown; findings below may reference images not displayed]

FINDINGS: The esophagus is unremarkable.  The small sliding-type
hiatal hernia is noted.  GE reflux was demonstrated.  Surgical
changes involving the stomach.  Normal emptying into the jejunum.
The small bowel loops are unremarkable.  The terminal ileum is
normal.
IMPRESSION: 1.  Small sliding-type hiatal hernia and GE reflux.
2.  Normal postoperative appearance of the stomach from a partial
gastrectomy and gastrojejunostomy.
2.  Normal appearance of the small bowel.  No obstruction.

## 2014-06-06 ENCOUNTER — Telehealth: Payer: Self-pay

## 2014-06-06 NOTE — Telephone Encounter (Signed)
-----   Message from Konner Saiz E [place], [place] sent at 03/14/2014  5:50 PM EST ----- Please call and remind her to get blood work done in Feb.2016 for her Vit B 12 level.

## 2014-06-06 NOTE — Telephone Encounter (Signed)
Reminded patient to come get lab work drawn.  Gave her the hours they are open.  Orders are in epic to have B12 drawn.

## 2014-06-12 DIAGNOSIS — R32 Unspecified urinary incontinence: Secondary | ICD-10-CM | POA: Diagnosis not present

## 2014-06-14 ENCOUNTER — Telehealth: Payer: Self-pay | Admitting: Internal Medicine

## 2014-06-14 NOTE — Telephone Encounter (Signed)
OK to refill Hydrocodone

## 2014-06-14 NOTE — Telephone Encounter (Signed)
The patient would like a refill on their Hydrocodone-acetaminophen (NORCO/VICODIN) 5-325 mg per tablet. She takes 1 tablet, by mouth, every six (6) hours, as needed for severe pain. The last time you gave her a refill was on 04/17/2014. The quantity was 15 with no refills. Is it okay to give her a refill for this medication? Please advise.

## 2014-06-15 ENCOUNTER — Other Ambulatory Visit (INDEPENDENT_AMBULATORY_CARE_PROVIDER_SITE_OTHER): Payer: Medicaid Other

## 2014-06-15 DIAGNOSIS — E538 Deficiency of other specified B group vitamins: Secondary | ICD-10-CM

## 2014-06-15 LAB — VITAMIN B12: Vitamin B-12: 434 pg/mL (ref 211–911)

## 2014-06-15 MED ORDER — HYDROCODONE-ACETAMINOPHEN 5-325 MG PO TABS: 1.0000 | ORAL_TABLET | Freq: Four times a day (QID) | ORAL | Status: DC | PRN

## 2014-06-15 NOTE — Telephone Encounter (Signed)
Patient stopped by the office on 06/15/14 asking for her Rx refill of HYDROcodone-acetaminophen (NORCO/VICODIN). Patient thought her Rx was ready for pickup. I explained to the patient that Dr. Olevia Perches was not in today and would be back into the office around noon. Once Dr. Olevia Perches comes into the office, I will have her sign the Rx for the patient. I will call the patient to let them know when it is ready to be picked up. I apologized to the patient for any misunderstanding she had from our office. I also explained to her that I never called her to let her know the Rx was ready to be picked up. The patient stated she understood.The patient mentioned that she has [facility] and that they carry Ensure. The patient wants to start taking Ensure. I will talk to Dr. Olevia Perches about this. Her insurance, [place] [employer], did not cover Ensure before.

## 2014-06-15 NOTE — Telephone Encounter (Signed)
Rx printed out for HYDROcodone-acetaminophen (NORCO/VICODIN) 5-325 mg; #15; No refills.

## 2014-06-15 NOTE — Telephone Encounter (Signed)
Pt wants to know if this is ready to pick up; please call

## 2014-06-18 ENCOUNTER — Other Ambulatory Visit: Payer: Self-pay | Admitting: *Deleted

## 2014-06-18 DIAGNOSIS — E538 Deficiency of other specified B group vitamins: Secondary | ICD-10-CM

## 2014-06-18 MED ORDER — ENSURE PLUS PO LIQD: 1.0000 | Freq: Two times a day (BID) | ORAL | Status: DC

## 2014-06-18 NOTE — Telephone Encounter (Signed)
Called patient to let them know that their Rx for HYDROcodone-acetaminophen (NORCO/VICODIN) was ready to be picked up. Patient stated she understood.

## 2014-06-18 NOTE — Telephone Encounter (Signed)
Yes, we can send a prescription for Ensure  240cc/ca = 250 kcal,, drink 1 can bid.2 cases at a time, 10 refills. Flavor of her choice

## 2014-06-18 NOTE — Telephone Encounter (Signed)
Patient will get Rx for Ensure, 240cc/ca=250 kcal, drink 1 can bid, 2 cases at a time with 10 refills through [facility]. Patient is working through her insurance to get approved and get from [facility]. Patient is to call me back regarding this.

## 2014-06-18 NOTE — Telephone Encounter (Signed)
Patient needs to gain weight and wants to take Ensure to help her do that. She stated to me that she is with [facility] now and they carry Ensure. The patient would like to know if you could give her a Rx for Ensure. Please advise.

## 2014-06-21 ENCOUNTER — Telehealth: Payer: Self-pay | Admitting: Internal Medicine

## 2014-06-21 NOTE — Telephone Encounter (Signed)
Spoke with patient and she states she has had lower abdominal pain for the last 3-4 days. Yesterday, the pain was a her navel to sides and lower back. She has also had an increase in nausea and has had to take more nausea medication. Offered OV today but she cannot come. Scheduled with Tye Savoy, NP tomorrow at 1:30 PM.

## 2014-06-22 ENCOUNTER — Ambulatory Visit (INDEPENDENT_AMBULATORY_CARE_PROVIDER_SITE_OTHER)
Admission: RE | Admit: 2014-06-22 | Discharge: 2014-06-22 | Disposition: A | Discharge status: Home / Self Care | Payer: 59 | Source: Ambulatory Visit | Attending: Nurse Practitioner | Admitting: Nurse Practitioner

## 2014-06-22 ENCOUNTER — Ambulatory Visit (INDEPENDENT_AMBULATORY_CARE_PROVIDER_SITE_OTHER): Payer: 59 | Admitting: Nurse Practitioner

## 2014-06-22 ENCOUNTER — Encounter: Payer: Self-pay | Admitting: Nurse Practitioner

## 2014-06-22 ENCOUNTER — Other Ambulatory Visit (INDEPENDENT_AMBULATORY_CARE_PROVIDER_SITE_OTHER): Payer: 59

## 2014-06-22 VITALS — BP 138/64 | HR 80 | Ht 64.0 in | Wt 122.0 lb

## 2014-06-22 DIAGNOSIS — R1084 Generalized abdominal pain: Secondary | ICD-10-CM | POA: Diagnosis not present

## 2014-06-22 DIAGNOSIS — R110 Nausea: Secondary | ICD-10-CM

## 2014-06-22 DIAGNOSIS — R109 Unspecified abdominal pain: Secondary | ICD-10-CM | POA: Diagnosis not present

## 2014-06-22 LAB — URINALYSIS, ROUTINE W REFLEX MICROSCOPIC
Bilirubin Urine: NEGATIVE
Hgb urine dipstick: NEGATIVE
Ketones, ur: NEGATIVE
Leukocytes, UA: NEGATIVE
Nitrite: NEGATIVE
Specific Gravity, Urine: 1.025 (ref 1.000–1.030)
Urine Glucose: NEGATIVE
Urobilinogen, UA: 0.2 (ref 0.0–1.0)
pH: 6.5 (ref 5.0–8.0)

## 2014-06-22 LAB — COMPREHENSIVE METABOLIC PANEL
ALT: 39 U/L — ABNORMAL HIGH (ref 0–35)
AST: 39 U/L — ABNORMAL HIGH (ref 0–37)
Albumin: 4 g/dL (ref 3.5–5.2)
Alkaline Phosphatase: 126 U/L — ABNORMAL HIGH (ref 39–117)
BUN: 13 mg/dL (ref 6–23)
CO2: 28 mEq/L (ref 19–32)
Calcium: 9.1 mg/dL (ref 8.4–10.5)
Chloride: 109 mEq/L (ref 96–112)
Creatinine, Ser: 0.75 mg/dL (ref 0.40–1.20)
GFR: 97.14 mL/min (ref >60.00)
Glucose, Bld: 80 mg/dL (ref 70–99)
Potassium: 4 mEq/L (ref 3.5–5.1)
Sodium: 142 mEq/L (ref 135–145)
Total Bilirubin: 0.3 mg/dL (ref 0.2–1.2)
Total Protein: 7.1 g/dL (ref 6.0–8.3)

## 2014-06-22 NOTE — Telephone Encounter (Signed)
Talked to patient today when they had a visit with Tye Savoy, NP. Patient stated that her insurance will not cover Ensure through [facility].

## 2014-06-22 NOTE — Patient Instructions (Signed)
Please go to the basement level to have your labs drawn and go to the radiology department also for an x-ray. Follow up with Dr. Olevia Perches as needed.

## 2014-06-23 DIAGNOSIS — J690 Pneumonitis due to inhalation of food and vomit: Secondary | ICD-10-CM | POA: Diagnosis not present

## 2014-06-24 DIAGNOSIS — J690 Pneumonitis due to inhalation of food and vomit: Secondary | ICD-10-CM | POA: Diagnosis not present

## 2014-06-25 DIAGNOSIS — J690 Pneumonitis due to inhalation of food and vomit: Secondary | ICD-10-CM | POA: Diagnosis not present

## 2014-06-25 DIAGNOSIS — E1140 Type 2 diabetes mellitus with diabetic neuropathy, unspecified: Secondary | ICD-10-CM | POA: Diagnosis not present

## 2014-06-25 DIAGNOSIS — I10 Essential (primary) hypertension: Secondary | ICD-10-CM | POA: Diagnosis not present

## 2014-06-25 DIAGNOSIS — R109 Unspecified abdominal pain: Secondary | ICD-10-CM | POA: Diagnosis not present

## 2014-06-25 DIAGNOSIS — F17210 Nicotine dependence, cigarettes, uncomplicated: Secondary | ICD-10-CM | POA: Diagnosis not present

## 2014-06-25 DIAGNOSIS — M179 Osteoarthritis of knee, unspecified: Secondary | ICD-10-CM | POA: Diagnosis not present

## 2014-06-26 ENCOUNTER — Encounter: Payer: Self-pay | Admitting: Nurse Practitioner

## 2014-06-26 ENCOUNTER — Telehealth: Payer: Self-pay | Admitting: Internal Medicine

## 2014-06-26 ENCOUNTER — Other Ambulatory Visit: Payer: Self-pay

## 2014-06-26 DIAGNOSIS — Z1231 Encounter for screening mammogram for malignant neoplasm of breast: Secondary | ICD-10-CM

## 2014-06-26 DIAGNOSIS — J690 Pneumonitis due to inhalation of food and vomit: Secondary | ICD-10-CM | POA: Diagnosis not present

## 2014-06-26 DIAGNOSIS — R110 Nausea: Secondary | ICD-10-CM | POA: Insufficient documentation

## 2014-06-26 NOTE — Progress Notes (Signed)
     History of Present Illness:   Patient is a 74 year old female with a complex GI history well known to this practice. Most recently patient has been followed for esophageal dysmotility / GERD. Patient comes in today with complaints of diffuse abdominal pain and postprandial nausea. She has had a several month history of intermittent mid abdominal pain unrelated to meals or defecation. Several days ago she developed pelvic pain as well. Pain radiates around BOTH sides of abdomen into lower back. Recently started on medication for urinary frequency. No dysuria. Bowel movements at baseline. No fevers. Her last colonoscopy was Jan 2014 with findings of sigmoid diverticulosis.   Patient complains of weight loss and believes enteral feedings should be resumed. Gtube fell out a few months back.    Current Medications, Allergies, Past Medical History, Past Surgical History, Family History and Social History were reviewed in [employer] record.   Physical Exam: General: Pleasant, well developed , black female in no acute distress Head: Normocephalic and atraumatic Eyes:  sclerae anicteric, conjunctiva pink  Ears: Normal auditory acuity Lungs: Clear throughout to auscultation Heart: Regular rate and rhythm Abdomen: Soft, non distended, non-tender. No masses, no hepatomegaly. Normal bowel sounds Musculoskeletal: Symmetrical with no gross deformities  Extremities: No edema  Neurological: Alert oriented x 4, grossly nonfocal Psychological:  Alert and cooperative. Normal mood and affect  Assessment and Recommendations:  74 year old female with complex GI history . She is s/p subtotal gastrectomy and Roux-en-Y jejunostomy and feeding jejunostomy three years ago. Feeding tube became dislodged several months ago, she has been taking PO since.  Now with postprandial nausea as well as mid to lower (pelvic pain). Her documented weight is stable. Will check basic labs but she  appears hydrated and nourished. For nausea, suspect exacerbation of chronic nausea but will check abdominal films to look for partial bowel obstruction. Will check a urinalysis as well.   Patient already has a written prescription for pain medication here at the office which I have given her. Continue prn phenergan. At one time patient was on Reglan. Not sure if her primary GI Dr. Olevia Perches discontinued it . Will call patient with test results

## 2014-06-26 NOTE — Progress Notes (Signed)
Reviewed and discussed with Ms Chester Holstein NP. Check nutritional parameters

## 2014-06-26 NOTE — Telephone Encounter (Signed)
Emily [name] please advise on her lab work

## 2014-06-27 DIAGNOSIS — J690 Pneumonitis due to inhalation of food and vomit: Secondary | ICD-10-CM | POA: Diagnosis not present

## 2014-06-28 DIAGNOSIS — J690 Pneumonitis due to inhalation of food and vomit: Secondary | ICD-10-CM | POA: Diagnosis not present

## 2014-06-29 DIAGNOSIS — J690 Pneumonitis due to inhalation of food and vomit: Secondary | ICD-10-CM | POA: Diagnosis not present

## 2014-06-30 DIAGNOSIS — J690 Pneumonitis due to inhalation of food and vomit: Secondary | ICD-10-CM | POA: Diagnosis not present

## 2014-07-01 DIAGNOSIS — J690 Pneumonitis due to inhalation of food and vomit: Secondary | ICD-10-CM | POA: Diagnosis not present

## 2014-07-03 DIAGNOSIS — J690 Pneumonitis due to inhalation of food and vomit: Secondary | ICD-10-CM | POA: Diagnosis not present

## 2014-07-04 ENCOUNTER — Ambulatory Visit: Payer: 59

## 2014-07-04 DIAGNOSIS — J690 Pneumonitis due to inhalation of food and vomit: Secondary | ICD-10-CM | POA: Diagnosis not present

## 2014-07-05 DIAGNOSIS — J690 Pneumonitis due to inhalation of food and vomit: Secondary | ICD-10-CM | POA: Diagnosis not present

## 2014-07-06 ENCOUNTER — Ambulatory Visit
Admission: RE | Admit: 2014-07-06 | Discharge: 2014-07-06 | Disposition: A | Discharge status: Home / Self Care | Payer: Medicare Other | Source: Ambulatory Visit

## 2014-07-06 DIAGNOSIS — J690 Pneumonitis due to inhalation of food and vomit: Secondary | ICD-10-CM | POA: Diagnosis not present

## 2014-07-06 DIAGNOSIS — Z1231 Encounter for screening mammogram for malignant neoplasm of breast: Secondary | ICD-10-CM | POA: Diagnosis not present

## 2014-07-07 DIAGNOSIS — J690 Pneumonitis due to inhalation of food and vomit: Secondary | ICD-10-CM | POA: Diagnosis not present

## 2014-07-08 DIAGNOSIS — J690 Pneumonitis due to inhalation of food and vomit: Secondary | ICD-10-CM | POA: Diagnosis not present

## 2014-07-09 DIAGNOSIS — J690 Pneumonitis due to inhalation of food and vomit: Secondary | ICD-10-CM | POA: Diagnosis not present

## 2014-07-10 DIAGNOSIS — J690 Pneumonitis due to inhalation of food and vomit: Secondary | ICD-10-CM | POA: Diagnosis not present

## 2014-07-11 DIAGNOSIS — J690 Pneumonitis due to inhalation of food and vomit: Secondary | ICD-10-CM | POA: Diagnosis not present

## 2014-07-12 DIAGNOSIS — J690 Pneumonitis due to inhalation of food and vomit: Secondary | ICD-10-CM | POA: Diagnosis not present

## 2014-07-13 DIAGNOSIS — J690 Pneumonitis due to inhalation of food and vomit: Secondary | ICD-10-CM | POA: Diagnosis not present

## 2014-07-14 DIAGNOSIS — J690 Pneumonitis due to inhalation of food and vomit: Secondary | ICD-10-CM | POA: Diagnosis not present

## 2014-07-15 DIAGNOSIS — J690 Pneumonitis due to inhalation of food and vomit: Secondary | ICD-10-CM | POA: Diagnosis not present

## 2014-07-16 DIAGNOSIS — J690 Pneumonitis due to inhalation of food and vomit: Secondary | ICD-10-CM | POA: Diagnosis not present

## 2014-07-17 DIAGNOSIS — R32 Unspecified urinary incontinence: Secondary | ICD-10-CM | POA: Diagnosis not present

## 2014-07-18 DIAGNOSIS — J690 Pneumonitis due to inhalation of food and vomit: Secondary | ICD-10-CM | POA: Diagnosis not present

## 2014-07-19 DIAGNOSIS — J690 Pneumonitis due to inhalation of food and vomit: Secondary | ICD-10-CM | POA: Diagnosis not present

## 2014-07-20 DIAGNOSIS — J690 Pneumonitis due to inhalation of food and vomit: Secondary | ICD-10-CM | POA: Diagnosis not present

## 2014-07-21 DIAGNOSIS — J690 Pneumonitis due to inhalation of food and vomit: Secondary | ICD-10-CM | POA: Diagnosis not present

## 2014-07-22 DIAGNOSIS — J690 Pneumonitis due to inhalation of food and vomit: Secondary | ICD-10-CM | POA: Diagnosis not present

## 2014-07-23 ENCOUNTER — Other Ambulatory Visit: Payer: Self-pay | Admitting: Internal Medicine

## 2014-07-23 DIAGNOSIS — J690 Pneumonitis due to inhalation of food and vomit: Secondary | ICD-10-CM | POA: Diagnosis not present

## 2014-07-24 ENCOUNTER — Telehealth: Payer: Self-pay | Admitting: Internal Medicine

## 2014-07-24 DIAGNOSIS — J690 Pneumonitis due to inhalation of food and vomit: Secondary | ICD-10-CM | POA: Diagnosis not present

## 2014-07-24 MED ORDER — HYDROCODONE-ACETAMINOPHEN 5-325 MG PO TABS: 1.0000 | ORAL_TABLET | Freq: Four times a day (QID) | ORAL | Status: DC | PRN

## 2014-07-24 NOTE — Telephone Encounter (Signed)
I need to know last time refill and how many she gets and how often.  Thanx.OK to refill if it has been a month.

## 2014-07-24 NOTE — Telephone Encounter (Signed)
Spoke with patient and she is asking for a refill on Vicodin. Please, advise.

## 2014-07-24 NOTE — Telephone Encounter (Signed)
Last refill on 06/15/14 for

## 2014-07-24 NOTE — Telephone Encounter (Signed)
Last refill on 2/l12/16 for # 15 tabs. Rx up front for pickup. Patient aware.

## 2014-07-25 DIAGNOSIS — J690 Pneumonitis due to inhalation of food and vomit: Secondary | ICD-10-CM | POA: Diagnosis not present

## 2014-07-26 DIAGNOSIS — J690 Pneumonitis due to inhalation of food and vomit: Secondary | ICD-10-CM | POA: Diagnosis not present

## 2014-07-27 DIAGNOSIS — J690 Pneumonitis due to inhalation of food and vomit: Secondary | ICD-10-CM | POA: Diagnosis not present

## 2014-07-28 DIAGNOSIS — J690 Pneumonitis due to inhalation of food and vomit: Secondary | ICD-10-CM | POA: Diagnosis not present

## 2014-07-29 DIAGNOSIS — J690 Pneumonitis due to inhalation of food and vomit: Secondary | ICD-10-CM | POA: Diagnosis not present

## 2014-07-30 ENCOUNTER — Telehealth: Payer: Self-pay | Admitting: *Deleted

## 2014-07-30 DIAGNOSIS — J690 Pneumonitis due to inhalation of food and vomit: Secondary | ICD-10-CM | POA: Diagnosis not present

## 2014-07-30 MED ORDER — LORAZEPAM 0.5 MG PO TABS: ORAL_TABLET | ORAL | Status: DC

## 2014-07-30 NOTE — Telephone Encounter (Signed)
Faxed over Rx for Lorazepam (ATIVAN), 0.5 mg, #90 with one refill to [facility] on 07/31/14.

## 2014-07-31 ENCOUNTER — Telehealth: Payer: Self-pay | Admitting: *Deleted

## 2014-07-31 DIAGNOSIS — J690 Pneumonitis due to inhalation of food and vomit: Secondary | ICD-10-CM | POA: Diagnosis not present

## 2014-07-31 NOTE — Telephone Encounter (Signed)
Faxed over Rx for Lorazepam (ATIVAN), 0.5 mg, #90 with one refill to [facility] on [facility] in [place], [place] on 07/31/14 at 9:00 am.

## 2014-08-04 DIAGNOSIS — J690 Pneumonitis due to inhalation of food and vomit: Secondary | ICD-10-CM | POA: Diagnosis not present

## 2014-08-05 DIAGNOSIS — J690 Pneumonitis due to inhalation of food and vomit: Secondary | ICD-10-CM | POA: Diagnosis not present

## 2014-08-06 DIAGNOSIS — J690 Pneumonitis due to inhalation of food and vomit: Secondary | ICD-10-CM | POA: Diagnosis not present

## 2014-08-07 DIAGNOSIS — J690 Pneumonitis due to inhalation of food and vomit: Secondary | ICD-10-CM | POA: Diagnosis not present

## 2014-08-08 DIAGNOSIS — J690 Pneumonitis due to inhalation of food and vomit: Secondary | ICD-10-CM | POA: Diagnosis not present

## 2014-08-09 DIAGNOSIS — J690 Pneumonitis due to inhalation of food and vomit: Secondary | ICD-10-CM | POA: Diagnosis not present

## 2014-08-10 DIAGNOSIS — J690 Pneumonitis due to inhalation of food and vomit: Secondary | ICD-10-CM | POA: Diagnosis not present

## 2014-08-11 DIAGNOSIS — J690 Pneumonitis due to inhalation of food and vomit: Secondary | ICD-10-CM | POA: Diagnosis not present

## 2014-08-12 DIAGNOSIS — J690 Pneumonitis due to inhalation of food and vomit: Secondary | ICD-10-CM | POA: Diagnosis not present

## 2014-08-13 ENCOUNTER — Telehealth: Payer: Self-pay | Admitting: *Deleted

## 2014-08-13 DIAGNOSIS — J690 Pneumonitis due to inhalation of food and vomit: Secondary | ICD-10-CM | POA: Diagnosis not present

## 2014-08-13 MED ORDER — ESOMEPRAZOLE MAGNESIUM 40 MG PO CPDR: 40.0000 mg | DELAYED_RELEASE_CAPSULE | ORAL | Status: DC

## 2014-08-13 NOTE — Telephone Encounter (Signed)
Sent Rx for esomeprazole ([place]), 40 mg, #30 with 2 refills to [facility] off [address] in [place], [place] on 08/13/14.

## 2014-08-14 DIAGNOSIS — J690 Pneumonitis due to inhalation of food and vomit: Secondary | ICD-10-CM | POA: Diagnosis not present

## 2014-08-15 DIAGNOSIS — J690 Pneumonitis due to inhalation of food and vomit: Secondary | ICD-10-CM | POA: Diagnosis not present

## 2014-08-16 DIAGNOSIS — J690 Pneumonitis due to inhalation of food and vomit: Secondary | ICD-10-CM | POA: Diagnosis not present

## 2014-08-17 DIAGNOSIS — J690 Pneumonitis due to inhalation of food and vomit: Secondary | ICD-10-CM | POA: Diagnosis not present

## 2014-08-18 DIAGNOSIS — J690 Pneumonitis due to inhalation of food and vomit: Secondary | ICD-10-CM | POA: Diagnosis not present

## 2014-08-19 DIAGNOSIS — J690 Pneumonitis due to inhalation of food and vomit: Secondary | ICD-10-CM | POA: Diagnosis not present

## 2014-08-20 ENCOUNTER — Telehealth: Payer: Self-pay | Admitting: Internal Medicine

## 2014-08-20 DIAGNOSIS — J690 Pneumonitis due to inhalation of food and vomit: Secondary | ICD-10-CM | POA: Diagnosis not present

## 2014-08-20 NOTE — Telephone Encounter (Signed)
She may have #20 pills a month.

## 2014-08-20 NOTE — Telephone Encounter (Signed)
Called patient back on 08/20/14 at 5:00 pm at their home number [phone])  regarding their Rx request for hydrocodone-acetaminophen (NORCO/VICODIN), 5-325 mg, #15 with no refills. The patient takes 1 tablet, po, q6h, prn for severe pain.I told the patient they could not have a refill before 08/24/14. The patient stated that she would like to get more than 15 pills a month. Could the patient have more than 15 pills a month? Please advise.

## 2014-08-21 DIAGNOSIS — J690 Pneumonitis due to inhalation of food and vomit: Secondary | ICD-10-CM | POA: Diagnosis not present

## 2014-08-21 MED ORDER — HYDROCODONE-ACETAMINOPHEN 5-325 MG PO TABS: 1.0000 | ORAL_TABLET | Freq: Four times a day (QID) | ORAL | Status: DC | PRN

## 2014-08-21 NOTE — Addendum Note (Signed)
Addended by: Celene Skeen A on: 08/21/2014 12:07 PM   Modules accepted: Orders

## 2014-08-21 NOTE — Telephone Encounter (Signed)
Per Dr. Nichola Sizer approval, patient can have 20 pills for their hydrocodone-acetaminophen (NORCO/VICODIN). Called the patient at their home number [phone]) to let them know their medication could be increased from 15 pills to 20 pills a month. Patient is aware that Dr. Olevia Perches will not be able to sign the Rx until this Friday (08/24/14). Patient stated they understood.

## 2014-08-22 DIAGNOSIS — J690 Pneumonitis due to inhalation of food and vomit: Secondary | ICD-10-CM | POA: Diagnosis not present

## 2014-08-23 DIAGNOSIS — H401231 Low-tension glaucoma, bilateral, mild stage: Secondary | ICD-10-CM | POA: Diagnosis not present

## 2014-08-23 DIAGNOSIS — H02826 Cysts of left eye, unspecified eyelid: Secondary | ICD-10-CM | POA: Diagnosis not present

## 2014-08-23 DIAGNOSIS — J690 Pneumonitis due to inhalation of food and vomit: Secondary | ICD-10-CM | POA: Diagnosis not present

## 2014-08-24 DIAGNOSIS — J690 Pneumonitis due to inhalation of food and vomit: Secondary | ICD-10-CM | POA: Diagnosis not present

## 2014-08-25 DIAGNOSIS — J690 Pneumonitis due to inhalation of food and vomit: Secondary | ICD-10-CM | POA: Diagnosis not present

## 2014-08-26 DIAGNOSIS — J690 Pneumonitis due to inhalation of food and vomit: Secondary | ICD-10-CM | POA: Diagnosis not present

## 2014-08-27 DIAGNOSIS — J690 Pneumonitis due to inhalation of food and vomit: Secondary | ICD-10-CM | POA: Diagnosis not present

## 2014-08-28 DIAGNOSIS — J690 Pneumonitis due to inhalation of food and vomit: Secondary | ICD-10-CM | POA: Diagnosis not present

## 2014-08-29 DIAGNOSIS — J690 Pneumonitis due to inhalation of food and vomit: Secondary | ICD-10-CM | POA: Diagnosis not present

## 2014-08-30 DIAGNOSIS — J690 Pneumonitis due to inhalation of food and vomit: Secondary | ICD-10-CM | POA: Diagnosis not present

## 2014-09-02 DIAGNOSIS — J690 Pneumonitis due to inhalation of food and vomit: Secondary | ICD-10-CM | POA: Diagnosis not present

## 2014-09-03 ENCOUNTER — Telehealth: Payer: Self-pay | Admitting: Internal Medicine

## 2014-09-03 DIAGNOSIS — J690 Pneumonitis due to inhalation of food and vomit: Secondary | ICD-10-CM | POA: Diagnosis not present

## 2014-09-03 DIAGNOSIS — R109 Unspecified abdominal pain: Secondary | ICD-10-CM

## 2014-09-03 MED ORDER — HYOSCYAMINE SULFATE 0.125 MG SL SUBL: SUBLINGUAL_TABLET | SUBLINGUAL | Status: DC

## 2014-09-03 MED ORDER — ESOMEPRAZOLE MAGNESIUM 40 MG PO CPDR: DELAYED_RELEASE_CAPSULE | ORAL | Status: DC

## 2014-09-03 NOTE — Telephone Encounter (Signed)
Rx's sent. Labs in EPIC. Family gave # [phone] to reach patient. Left a message at this number for patient to call back.

## 2014-09-03 NOTE — Telephone Encounter (Signed)
Please stop Bentyl 51m ( listed on her med list), and start LevsinSL.125 mg ac tid, also increase Nexiem ( PPI) to bid x 1 week. Please obtain  c-met, CBC, amy, lipase.

## 2014-09-03 NOTE — Telephone Encounter (Signed)
Spoke with patient and she states for the last 3-4 days, she is having abdominal pain. States it is at her navel area. It hurts when not eating and when eating. States it woke her up this AM. She is taking Hydrocodone and Dicyclomine. She also reports nausea that is relieved with Phenergan. Denies vomiting. She is having boewl movements.Report 2 diarrhea stools yesterday which is normal for her. Please, advise.

## 2014-09-04 ENCOUNTER — Telehealth: Payer: Self-pay | Admitting: Internal Medicine

## 2014-09-04 DIAGNOSIS — H1089 Other conjunctivitis: Secondary | ICD-10-CM | POA: Diagnosis not present

## 2014-09-04 DIAGNOSIS — H02826 Cysts of left eye, unspecified eyelid: Secondary | ICD-10-CM | POA: Diagnosis not present

## 2014-09-04 DIAGNOSIS — J690 Pneumonitis due to inhalation of food and vomit: Secondary | ICD-10-CM | POA: Diagnosis not present

## 2014-09-04 DIAGNOSIS — R109 Unspecified abdominal pain: Secondary | ICD-10-CM

## 2014-09-04 NOTE — Telephone Encounter (Signed)
Patient given recommendations. She will come for labs tomorrow.

## 2014-09-05 DIAGNOSIS — J690 Pneumonitis due to inhalation of food and vomit: Secondary | ICD-10-CM | POA: Diagnosis not present

## 2014-09-05 NOTE — Telephone Encounter (Signed)
Pt said she is calling back about her Omeprazole med

## 2014-09-05 NOTE — Telephone Encounter (Signed)
Patient stated that the Levsin is too expensive. Her cost will be $77. Is there another medication the patient can take? Please advise.

## 2014-09-06 DIAGNOSIS — J690 Pneumonitis due to inhalation of food and vomit: Secondary | ICD-10-CM | POA: Diagnosis not present

## 2014-09-06 MED ORDER — SUCRALFATE 1 GM/10ML PO SUSP: 1.0000 g | Freq: Four times a day (QID) | ORAL | Status: DC

## 2014-09-06 NOTE — Telephone Encounter (Signed)
Sent Rx for carafate slurry, 2 tsp, po, bid for one week. There are labs already in the computer for CBC w/diff and C-MET.

## 2014-09-06 NOTE — Telephone Encounter (Signed)
Called patient at [phone] (home #) and talked to patient's son. Told son that Rx for carafate was ready to be picked up from their pharmacy. I also reminded him that his mom needs to come in to get labs done for CBC and C-met. Son stated he understood.

## 2014-09-06 NOTE — Telephone Encounter (Signed)
Please try Carafate slurry ,12 oz, 2 tsp po bid x 1 week. Also please repeat C-met, CBC.

## 2014-09-07 DIAGNOSIS — J690 Pneumonitis due to inhalation of food and vomit: Secondary | ICD-10-CM | POA: Diagnosis not present

## 2014-09-08 DIAGNOSIS — J690 Pneumonitis due to inhalation of food and vomit: Secondary | ICD-10-CM | POA: Diagnosis not present

## 2014-09-09 DIAGNOSIS — J690 Pneumonitis due to inhalation of food and vomit: Secondary | ICD-10-CM | POA: Diagnosis not present

## 2014-09-10 DIAGNOSIS — J690 Pneumonitis due to inhalation of food and vomit: Secondary | ICD-10-CM | POA: Diagnosis not present

## 2014-09-11 DIAGNOSIS — J690 Pneumonitis due to inhalation of food and vomit: Secondary | ICD-10-CM | POA: Diagnosis not present

## 2014-09-12 DIAGNOSIS — J690 Pneumonitis due to inhalation of food and vomit: Secondary | ICD-10-CM | POA: Diagnosis not present

## 2014-09-13 DIAGNOSIS — J690 Pneumonitis due to inhalation of food and vomit: Secondary | ICD-10-CM | POA: Diagnosis not present

## 2014-09-14 DIAGNOSIS — J690 Pneumonitis due to inhalation of food and vomit: Secondary | ICD-10-CM | POA: Diagnosis not present

## 2014-09-15 DIAGNOSIS — J690 Pneumonitis due to inhalation of food and vomit: Secondary | ICD-10-CM | POA: Diagnosis not present

## 2014-09-16 DIAGNOSIS — J690 Pneumonitis due to inhalation of food and vomit: Secondary | ICD-10-CM | POA: Diagnosis not present

## 2014-09-17 DIAGNOSIS — J690 Pneumonitis due to inhalation of food and vomit: Secondary | ICD-10-CM | POA: Diagnosis not present

## 2014-09-18 DIAGNOSIS — J690 Pneumonitis due to inhalation of food and vomit: Secondary | ICD-10-CM | POA: Diagnosis not present

## 2014-09-19 DIAGNOSIS — J690 Pneumonitis due to inhalation of food and vomit: Secondary | ICD-10-CM | POA: Diagnosis not present

## 2014-09-20 DIAGNOSIS — J690 Pneumonitis due to inhalation of food and vomit: Secondary | ICD-10-CM | POA: Diagnosis not present

## 2014-09-21 DIAGNOSIS — J690 Pneumonitis due to inhalation of food and vomit: Secondary | ICD-10-CM | POA: Diagnosis not present

## 2014-09-22 DIAGNOSIS — J690 Pneumonitis due to inhalation of food and vomit: Secondary | ICD-10-CM | POA: Diagnosis not present

## 2014-09-24 DIAGNOSIS — I6789 Other cerebrovascular disease: Secondary | ICD-10-CM | POA: Diagnosis not present

## 2014-09-24 DIAGNOSIS — I10 Essential (primary) hypertension: Secondary | ICD-10-CM | POA: Diagnosis not present

## 2014-09-24 DIAGNOSIS — E1140 Type 2 diabetes mellitus with diabetic neuropathy, unspecified: Secondary | ICD-10-CM | POA: Diagnosis not present

## 2014-09-24 DIAGNOSIS — J690 Pneumonitis due to inhalation of food and vomit: Secondary | ICD-10-CM | POA: Diagnosis not present

## 2014-09-24 DIAGNOSIS — K589 Irritable bowel syndrome without diarrhea: Secondary | ICD-10-CM | POA: Diagnosis not present

## 2014-09-24 DIAGNOSIS — E2839 Other primary ovarian failure: Secondary | ICD-10-CM | POA: Diagnosis not present

## 2014-09-25 ENCOUNTER — Other Ambulatory Visit (HOSPITAL_COMMUNITY): Payer: Self-pay | Admitting: Internal Medicine

## 2014-09-25 DIAGNOSIS — I639 Cerebral infarction, unspecified: Secondary | ICD-10-CM

## 2014-09-25 DIAGNOSIS — J690 Pneumonitis due to inhalation of food and vomit: Secondary | ICD-10-CM | POA: Diagnosis not present

## 2014-09-26 ENCOUNTER — Other Ambulatory Visit: Payer: Self-pay | Admitting: Internal Medicine

## 2014-09-26 ENCOUNTER — Ambulatory Visit (HOSPITAL_COMMUNITY)
Admission: RE | Admit: 2014-09-26 | Discharge: 2014-09-26 | Disposition: A | Discharge status: Home / Self Care | Payer: Medicare Other | Source: Ambulatory Visit | Attending: Internal Medicine | Admitting: Internal Medicine

## 2014-09-26 DIAGNOSIS — I6523 Occlusion and stenosis of bilateral carotid arteries: Secondary | ICD-10-CM | POA: Insufficient documentation

## 2014-09-26 DIAGNOSIS — I639 Cerebral infarction, unspecified: Secondary | ICD-10-CM | POA: Diagnosis not present

## 2014-09-26 DIAGNOSIS — E2839 Other primary ovarian failure: Secondary | ICD-10-CM

## 2014-09-26 DIAGNOSIS — J690 Pneumonitis due to inhalation of food and vomit: Secondary | ICD-10-CM | POA: Diagnosis not present

## 2014-09-26 DIAGNOSIS — M810 Age-related osteoporosis without current pathological fracture: Secondary | ICD-10-CM

## 2014-09-27 DIAGNOSIS — J690 Pneumonitis due to inhalation of food and vomit: Secondary | ICD-10-CM | POA: Diagnosis not present

## 2014-09-28 ENCOUNTER — Telehealth: Payer: Self-pay | Admitting: Internal Medicine

## 2014-09-28 DIAGNOSIS — I639 Cerebral infarction, unspecified: Secondary | ICD-10-CM | POA: Insufficient documentation

## 2014-09-28 DIAGNOSIS — J690 Pneumonitis due to inhalation of food and vomit: Secondary | ICD-10-CM | POA: Diagnosis not present

## 2014-09-28 MED ORDER — HYDROCODONE-ACETAMINOPHEN 5-325 MG PO TABS: 1.0000 | ORAL_TABLET | Freq: Four times a day (QID) | ORAL | Status: DC | PRN

## 2014-09-28 NOTE — Telephone Encounter (Signed)
Printed Rx for hydrocodone-acetaminophen (NORCO/VICODIN), 5-325 mg, #20 with no refills. Once Dr. Olevia Perches gets in, will have them sign and call the patient to pickup Rx.

## 2014-09-28 NOTE — Telephone Encounter (Signed)
Called patient at home to let them know their Rx was ready to be picked up and they could pickup on Tuesday, Oct 02, 2014 at the front desk.

## 2014-09-29 DIAGNOSIS — J690 Pneumonitis due to inhalation of food and vomit: Secondary | ICD-10-CM | POA: Diagnosis not present

## 2014-09-30 DIAGNOSIS — J690 Pneumonitis due to inhalation of food and vomit: Secondary | ICD-10-CM | POA: Diagnosis not present

## 2014-10-03 DIAGNOSIS — J690 Pneumonitis due to inhalation of food and vomit: Secondary | ICD-10-CM | POA: Diagnosis not present

## 2014-10-04 DIAGNOSIS — J690 Pneumonitis due to inhalation of food and vomit: Secondary | ICD-10-CM | POA: Diagnosis not present

## 2014-10-05 DIAGNOSIS — J690 Pneumonitis due to inhalation of food and vomit: Secondary | ICD-10-CM | POA: Diagnosis not present

## 2014-10-06 DIAGNOSIS — J690 Pneumonitis due to inhalation of food and vomit: Secondary | ICD-10-CM | POA: Diagnosis not present

## 2014-10-07 DIAGNOSIS — J690 Pneumonitis due to inhalation of food and vomit: Secondary | ICD-10-CM | POA: Diagnosis not present

## 2014-10-08 ENCOUNTER — Ambulatory Visit
Admission: RE | Admit: 2014-10-08 | Discharge: 2014-10-08 | Disposition: A | Discharge status: Home / Self Care | Payer: Medicare Other | Source: Ambulatory Visit | Attending: Internal Medicine | Admitting: Internal Medicine

## 2014-10-08 DIAGNOSIS — J690 Pneumonitis due to inhalation of food and vomit: Secondary | ICD-10-CM | POA: Diagnosis not present

## 2014-10-08 DIAGNOSIS — E2839 Other primary ovarian failure: Secondary | ICD-10-CM

## 2014-10-08 DIAGNOSIS — M810 Age-related osteoporosis without current pathological fracture: Secondary | ICD-10-CM | POA: Diagnosis not present

## 2014-10-09 DIAGNOSIS — J690 Pneumonitis due to inhalation of food and vomit: Secondary | ICD-10-CM | POA: Diagnosis not present

## 2014-10-10 ENCOUNTER — Telehealth: Payer: Self-pay | Admitting: Internal Medicine

## 2014-10-10 DIAGNOSIS — J690 Pneumonitis due to inhalation of food and vomit: Secondary | ICD-10-CM | POA: Diagnosis not present

## 2014-10-10 MED ORDER — DIPHENOXYLATE-ATROPINE 2.5-0.025 MG PO TABS: 1.0000 | ORAL_TABLET | Freq: Two times a day (BID) | ORAL | Status: DC | PRN

## 2014-10-10 NOTE — Telephone Encounter (Signed)
Faxed Rx for diphenoxylate-atropine (LOMOTIL), 2.5-0.025 mg, #60 with one refill to [facility] off [facility] in [place], [place].

## 2014-10-11 DIAGNOSIS — J690 Pneumonitis due to inhalation of food and vomit: Secondary | ICD-10-CM | POA: Diagnosis not present

## 2014-10-12 DIAGNOSIS — J690 Pneumonitis due to inhalation of food and vomit: Secondary | ICD-10-CM | POA: Diagnosis not present

## 2014-10-13 DIAGNOSIS — J690 Pneumonitis due to inhalation of food and vomit: Secondary | ICD-10-CM | POA: Diagnosis not present

## 2014-10-14 DIAGNOSIS — J690 Pneumonitis due to inhalation of food and vomit: Secondary | ICD-10-CM | POA: Diagnosis not present

## 2014-10-15 ENCOUNTER — Telehealth: Payer: Self-pay | Admitting: Internal Medicine

## 2014-10-15 DIAGNOSIS — J690 Pneumonitis due to inhalation of food and vomit: Secondary | ICD-10-CM | POA: Diagnosis not present

## 2014-10-15 NOTE — Telephone Encounter (Signed)
Spoke with patient and she is drinking the Ensure to help her gain weight. She has noticed that her blood sugar is going up after drinking Ensure or Boost.  She does not take anything for her diabetes.She will call her PCP to manage her blood sugar.

## 2014-10-15 NOTE — Telephone Encounter (Signed)
Patient notified of recommendations. 

## 2014-10-15 NOTE — Telephone Encounter (Signed)
I agree ,as far as I know she has not been a diabetic, but she needs to see her PCP . There is Glucerna, dietary supplement for diabetics.

## 2014-10-16 DIAGNOSIS — J690 Pneumonitis due to inhalation of food and vomit: Secondary | ICD-10-CM | POA: Diagnosis not present

## 2014-10-17 ENCOUNTER — Ambulatory Visit (INDEPENDENT_AMBULATORY_CARE_PROVIDER_SITE_OTHER): Payer: Medicare Other | Admitting: Podiatry

## 2014-10-17 ENCOUNTER — Encounter: Payer: Self-pay | Admitting: Podiatry

## 2014-10-17 VITALS — BP 132/73 | HR 89 | Temp 98.3°F

## 2014-10-17 DIAGNOSIS — M79676 Pain in unspecified toe(s): Secondary | ICD-10-CM | POA: Diagnosis not present

## 2014-10-17 DIAGNOSIS — B351 Tinea unguium: Secondary | ICD-10-CM | POA: Diagnosis not present

## 2014-10-17 DIAGNOSIS — J690 Pneumonitis due to inhalation of food and vomit: Secondary | ICD-10-CM | POA: Diagnosis not present

## 2014-10-17 NOTE — Patient Instructions (Signed)
Diabetes and Foot Care Diabetes may cause you to have problems because of poor blood supply (circulation) to your feet and legs. This may cause the skin on your feet to become thinner, break easier, and heal more slowly. Your skin may become dry, and the skin may peel and crack. You may also have nerve damage in your legs and feet causing decreased feeling in them. You may not notice minor injuries to your feet that could lead to infections or more serious problems. Taking care of your feet is one of the most important things you can do for yourself.  HOME CARE INSTRUCTIONS  Wear shoes at all times, even in the house. Do not go barefoot. Bare feet are easily injured.  Check your feet daily for blisters, cuts, and redness. If you cannot see the bottom of your feet, use a mirror or ask someone for help.  Wash your feet with warm water (do not use hot water) and mild soap. Then pat your feet and the areas between your toes until they are completely dry. Do not soak your feet as this can dry your skin.  Apply a moisturizing lotion or petroleum jelly (that does not contain alcohol and is unscented) to the skin on your feet and to dry, brittle toenails. Do not apply lotion between your toes.  Trim your toenails straight across. Do not dig under them or around the cuticle. File the edges of your nails with an emery board or nail file.  Do not cut corns or calluses or try to remove them with medicine.  Wear clean socks or stockings every day. Make sure they are not too tight. Do not wear knee-high stockings since they may decrease blood flow to your legs.  Wear shoes that fit properly and have enough cushioning. To break in new shoes, wear them for just a few hours a day. This prevents you from injuring your feet. Always look in your shoes before you put them on to be sure there are no objects inside.  Do not cross your legs. This may decrease the blood flow to your feet.  If you find a minor scrape,  cut, or break in the skin on your feet, keep it and the skin around it clean and dry. These areas may be cleansed with mild soap and water. Do not cleanse the area with peroxide, alcohol, or iodine.  When you remove an adhesive bandage, be sure not to damage the skin around it.  If you have a wound, look at it several times a day to make sure it is healing.  Do not use heating pads or hot water bottles. They may burn your skin. If you have lost feeling in your feet or legs, you may not know it is happening until it is too late.  Make sure your health care provider performs a complete foot exam at least annually or more often if you have foot problems. Report any cuts, sores, or bruises to your health care provider immediately. SEEK MEDICAL CARE IF:   You have an injury that is not healing.  You have cuts or breaks in the skin.  You have an ingrown nail.  You notice redness on your legs or feet.  You feel burning or tingling in your legs or feet.  You have pain or cramps in your legs and feet.  Your legs or feet are numb.  Your feet always feel cold. SEEK IMMEDIATE MEDICAL CARE IF:   There is increasing redness,   swelling, or pain in or around a wound.  There is a red line that goes up your leg.  Pus is coming from a wound.  You develop a fever or as directed by your health care provider.  You notice a bad smell coming from an ulcer or wound. Document Released: 04/17/2000 Document Revised: 12/21/2012 Document Reviewed: 09/27/2012 Exit[employer] Patient Information 2015 [employer]. This information is not intended to replace advice given to you by your health care provider. Make sure you discuss any questions you have with your health care provider.  

## 2014-10-17 NOTE — Progress Notes (Signed)
   Subjective:    Patient ID: Emily [name], female    DOB: 1940-07-26, 74 y.o.   MRN: [id number]  HPI N- pain L-B/L toenails D-1year ago and not often O-intermittent C-sharpe A-sometimes socks T-tried to cut herself but now could not because of diabetes  Patient presents here today for a B/l toenail trim and she is having sharpe pains in her toes.  Patient denies any history of foot ulceration or claudication   Review of Systems  All other systems reviewed and are negative.      Objective:   Physical Exam  Patient appears orientated  3  Vascular: DP pulses 2/4 bilaterally PT pulses 2/4 bilaterally Capillary reflex immediate bilaterally  Dermatological: The toenails are elongated, hypertrophic, incurvated, discolored and tender to direct palpation  Neurological: Sensation to 10 g monofilament wire intact 4/5 right and 5/5 left Vibratory sensation intact bilaterally Ankle reflex equal and reactive bilaterally  Musculoskeletal: Patient walks slowly with walker HAV deformities bilaterally Pes planus bilaterally No restriction ankle, subtalar, midtarsal joints bilaterally      Assessment & Plan:   Assessment: Satisfactory neurovascular status Symptomatic onychomycoses 6-10  Plan: Reviewed the results of examination today The toenails 10 were debrided without any bleeding  Reappoint 3 months

## 2014-10-18 ENCOUNTER — Encounter: Payer: Self-pay | Admitting: Podiatry

## 2014-10-18 DIAGNOSIS — J690 Pneumonitis due to inhalation of food and vomit: Secondary | ICD-10-CM | POA: Diagnosis not present

## 2014-10-19 DIAGNOSIS — J690 Pneumonitis due to inhalation of food and vomit: Secondary | ICD-10-CM | POA: Diagnosis not present

## 2014-10-23 IMAGING — CR DG CHEST 2V
2 series · 2 of 2 positions shown · non-contrast
Comparison: DG ABD ACUTE W/CHEST dated 10/21/2012

CLINICAL DATA: Left-sided chest wall discomfort status post fall,
history of smoking and diabetes and hypertension

EXAM:
CHEST  2 VIEW

[w chest pa]
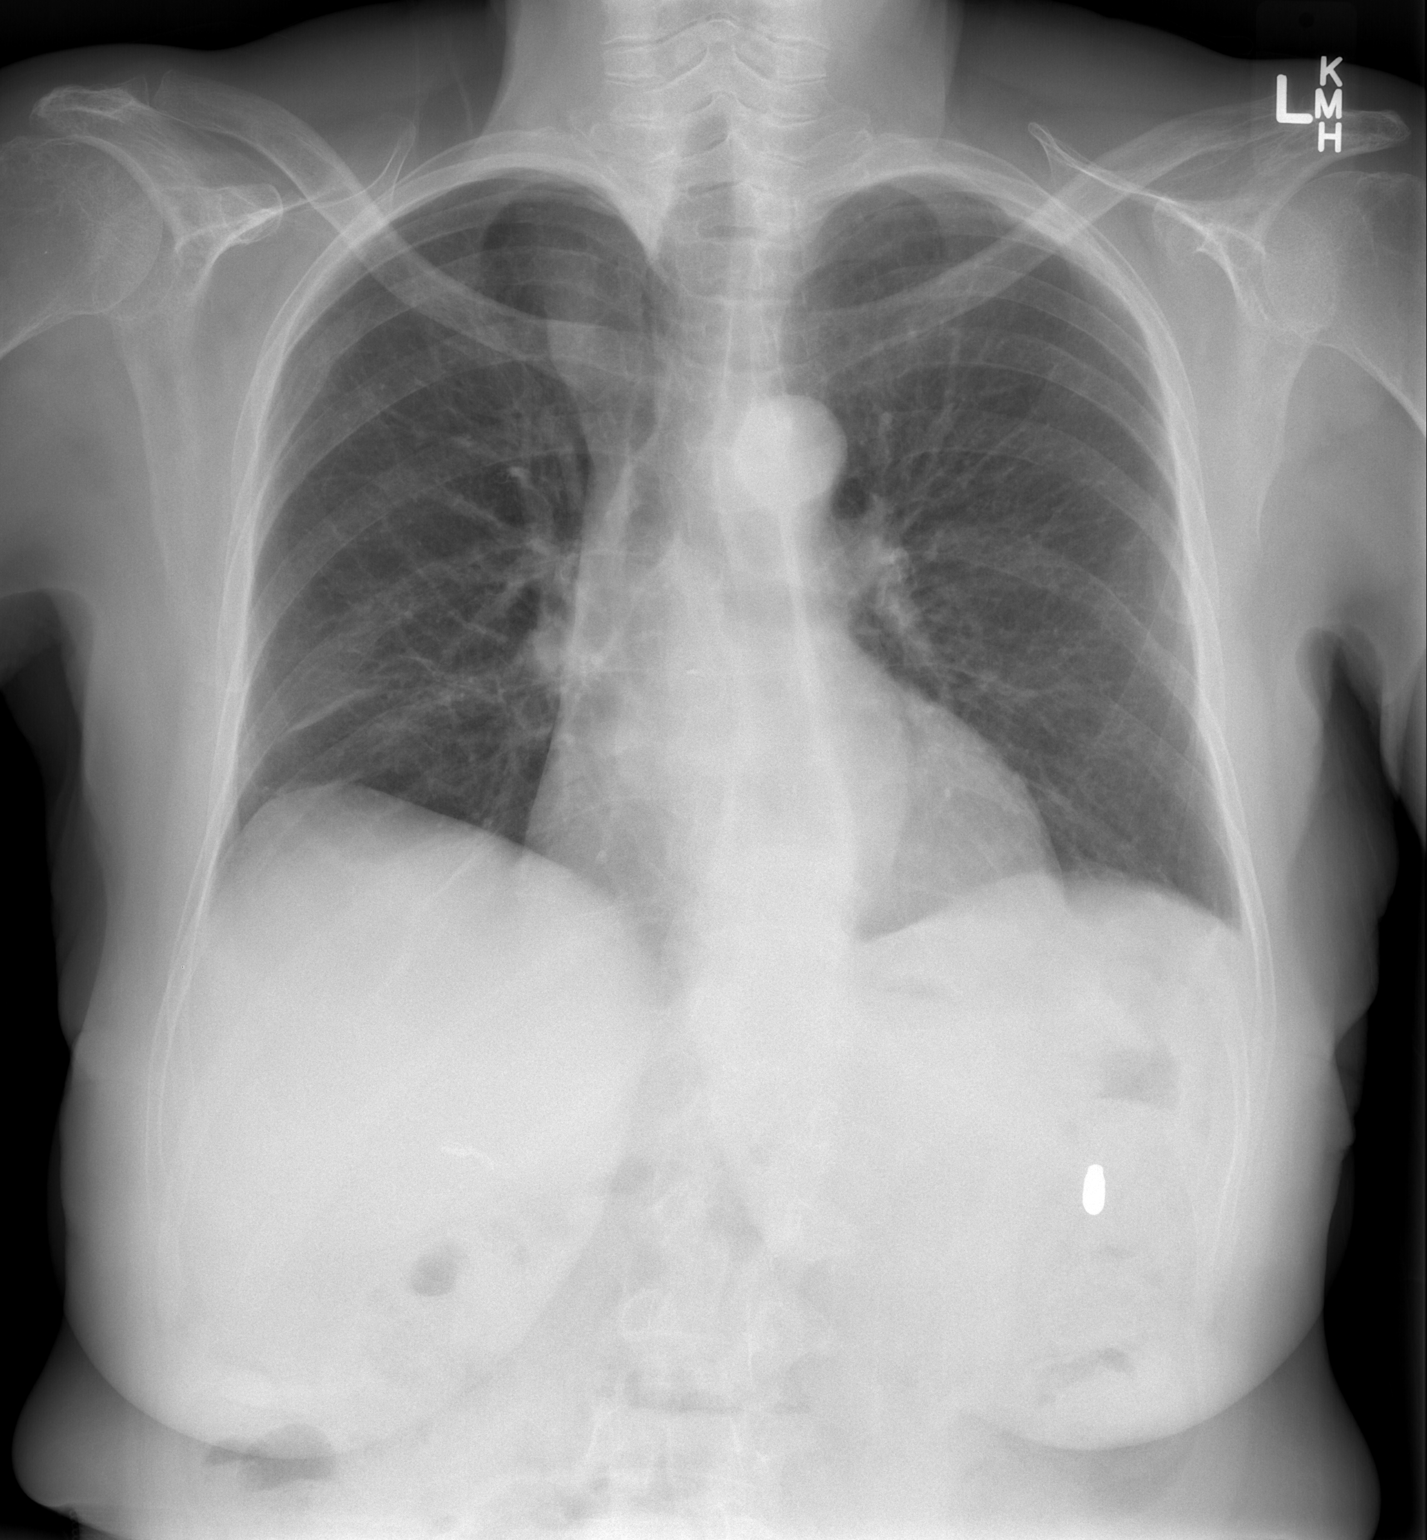

[w chest lat]
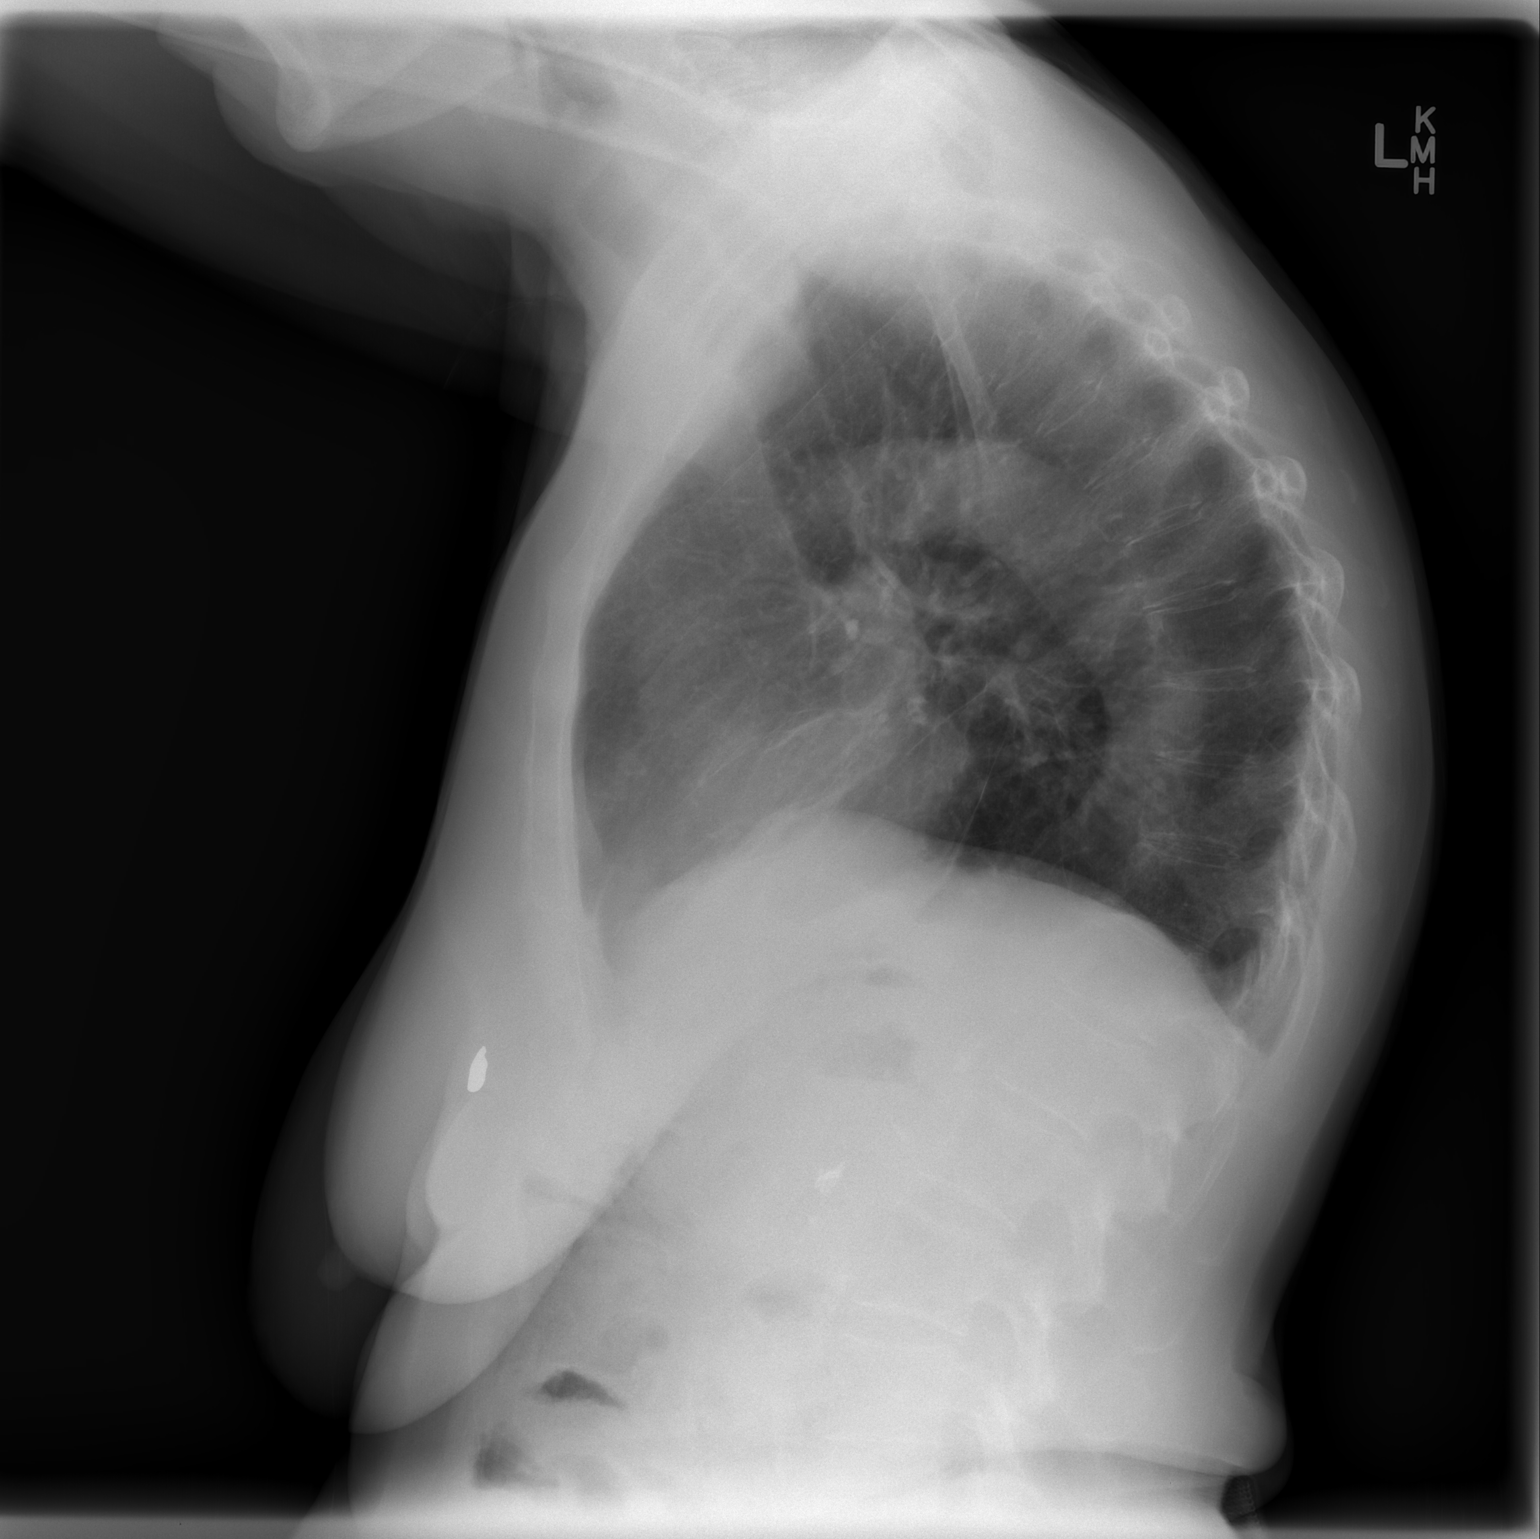

[2 of 2 positions shown; findings below may reference images not displayed]

FINDINGS: The right lung is borderline hypoinflated. There is no focal
infiltrate. There is linear density approximately 2 cm above the
lateral aspect of the right hemidiaphragm which likely reflects
either small amount of fluid in the minor fissure or scarring. This
is much improved over the previous study. The cardiac silhouette is
mildly enlarged. The pulmonary vascularity is not engorged. There is
tortuosity of the descending thoracic aorta. There is no pleural
effusion. There is prominent thoracic kyphosis without evidence of a
compression fracture.
IMPRESSION: There is no evidence of pneumonia nor CHF. Minimal atelectasis
versus scarring in the right lower hemi thorax anteriorly is present
but has improved since the previous exam.

## 2014-10-24 ENCOUNTER — Telehealth: Payer: Self-pay | Admitting: Internal Medicine

## 2014-10-24 NOTE — Telephone Encounter (Signed)
Patient given recommendations. 

## 2014-10-24 NOTE — Telephone Encounter (Signed)
Patient states she has been feeling bad since last week. States last week, she started having pain in chest when she ate. On Sunday, she went to a cookout and had vomiting after eating. Also reports constipation x 1 week with some bleeding. States she took Miralax and felt better. She reports she had a bowel movement today. States her stomach feels like it has knots in it. Pain is under breast and above navel at the top of her stomach. She is having nausea. She has taken Phenergan that helps some. She is taking Nexium daily and has tried Carafate but it did not help. Please, advise.

## 2014-10-24 NOTE — Telephone Encounter (Signed)
I think she has a flare up of gastroparesis. Stay on liquid food only for 24-48 hours. Take Phenergan prn nausea. Obtain Gaviscon tabs OTC, and chew 2 tabs after each meal ( 6/day).

## 2014-10-28 IMAGING — CT CT HEAD W/O CM
2 series · 15 of 30 positions shown, 19 images · non-contrast
Comparison: May 27, 2009

CLINICAL DATA: Slurred speech; 10 day history of right facial
droop; intermittent diplopia

EXAM:
CT HEAD WITHOUT CONTRAST
TECHNIQUE: Contiguous axial images were obtained from the base of the skull
through the vertex without intravenous contrast.

[Series 3: head bone · axial · 0.49mm/px · z∈[+19,+40]mm · 2 of 28 slices shown]
[im 2/28  bone]
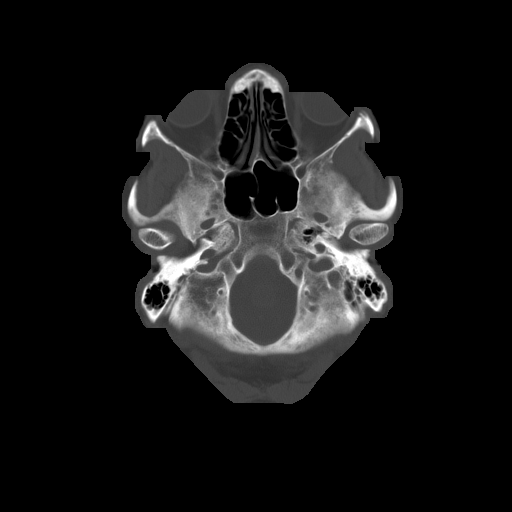
[im 6/28  bone]
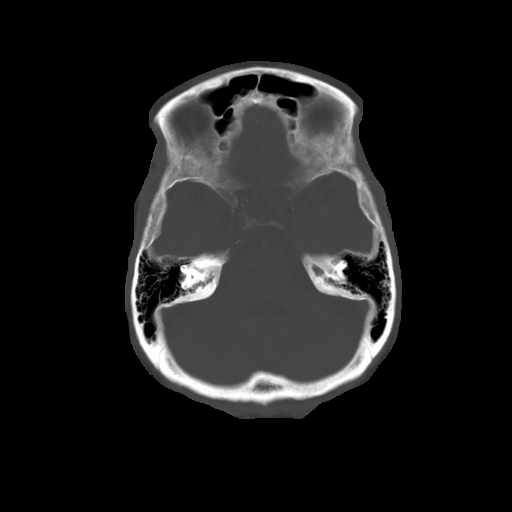

[Series 32: 3d filtered head w/o · axial · non-contrast · 0.49mm/px · z∈[+19,+146]mm · 13 of 28 slices shown, 17 images]
[im 2/28  brain]
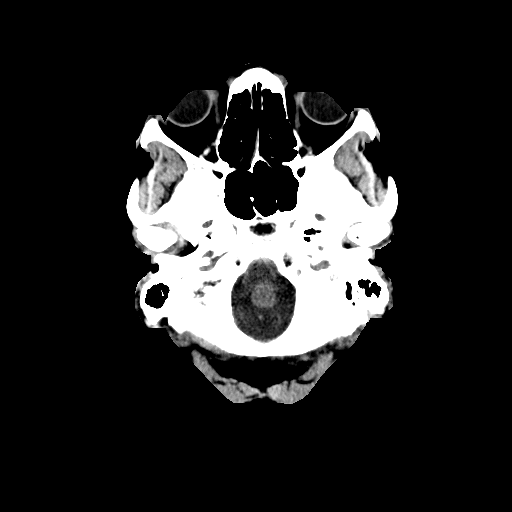
[im 2/28  bone]
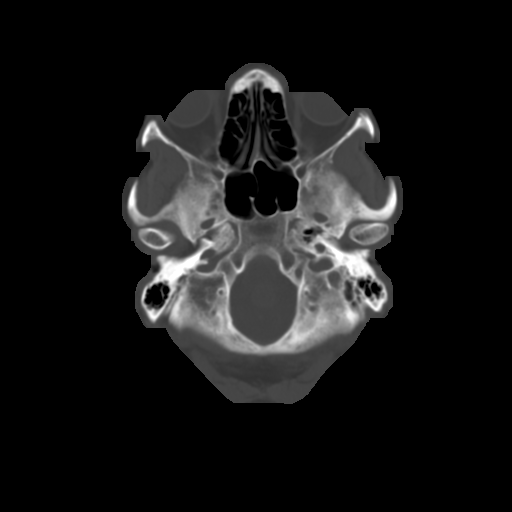
[im 4/28  brain]
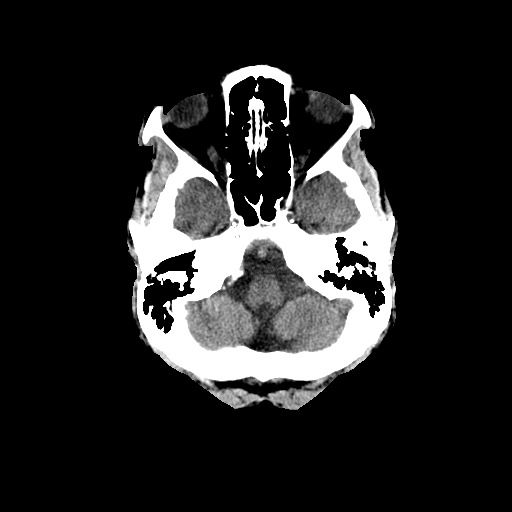
[im 6/28  brain]
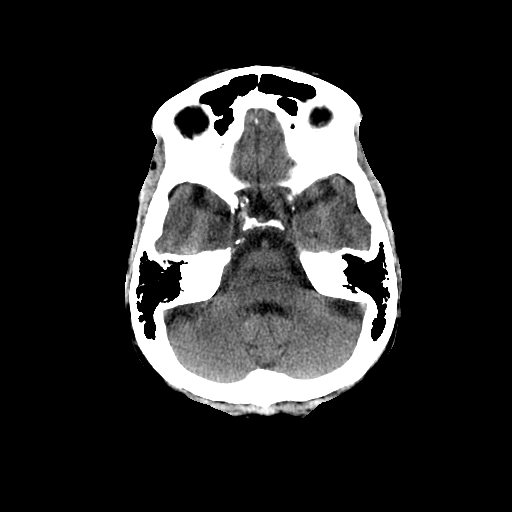
[im 8/28  brain]
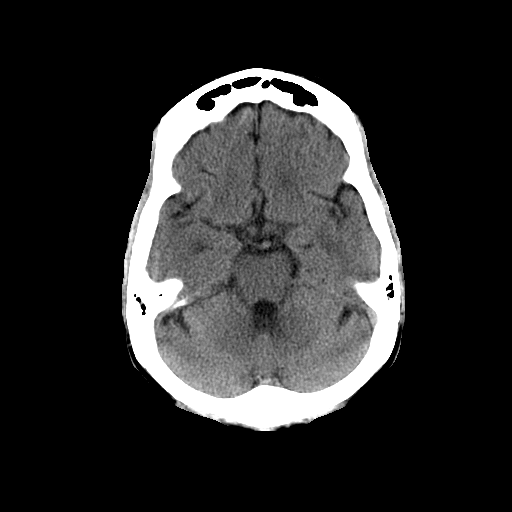
[im 10/28  brain]
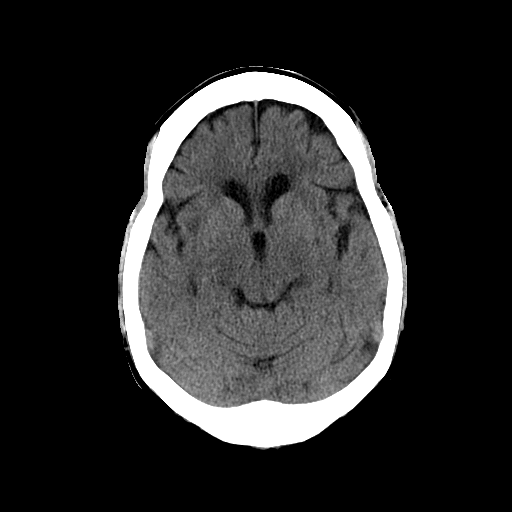
[im 10/28  bone]
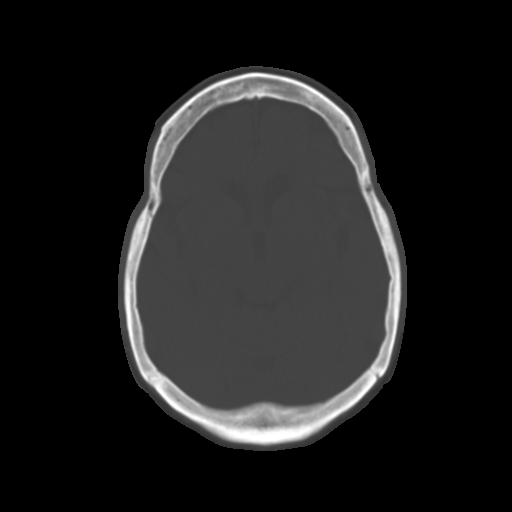
[im 12/28  brain]
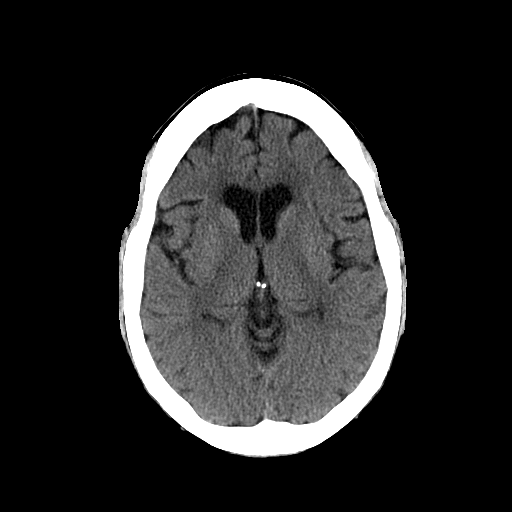
[im 14/28  brain]
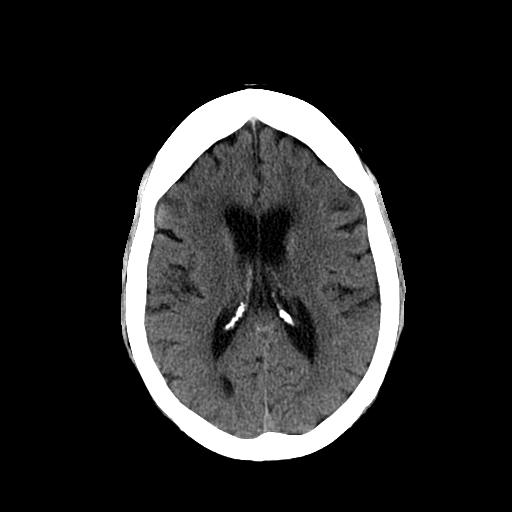
[im 16/28  brain]
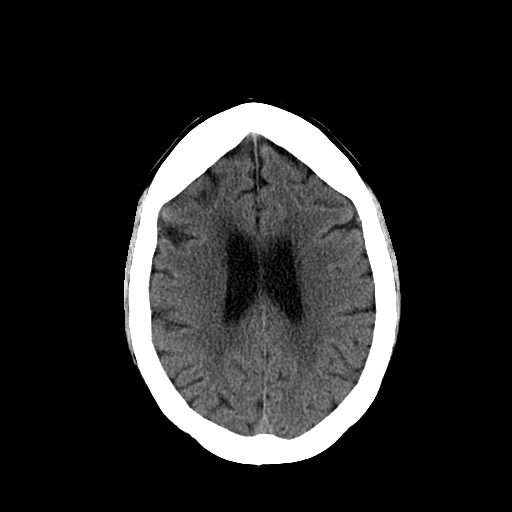
[im 18/28  brain]
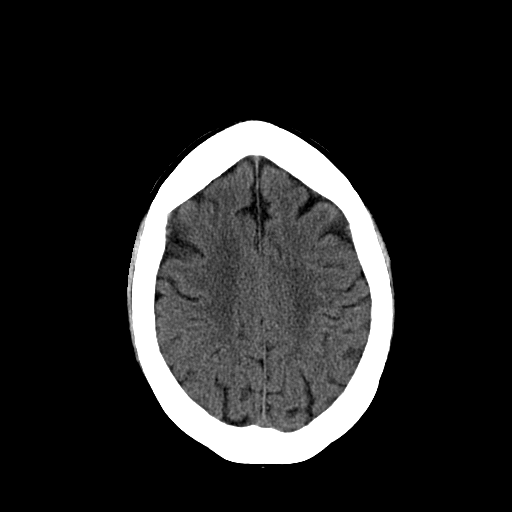
[im 18/28  bone]
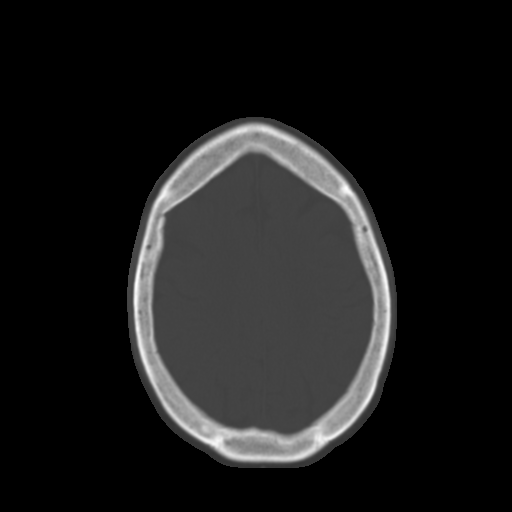
[im 20/28  brain]
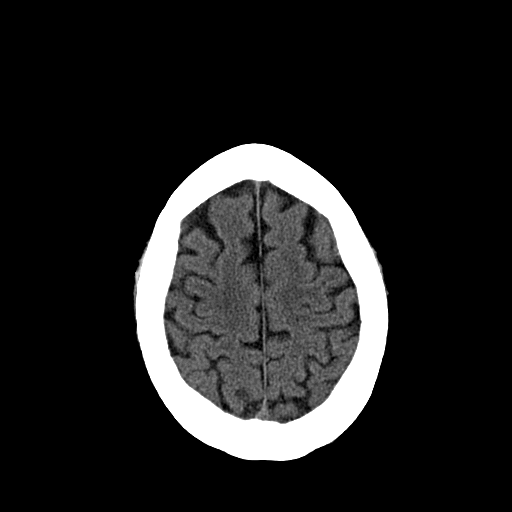
[im 22/28  brain]
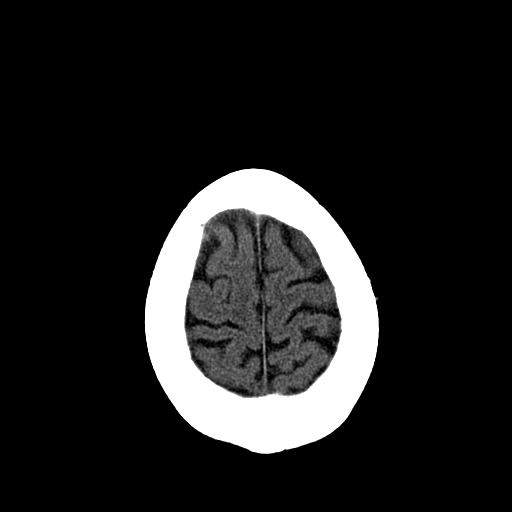
[im 24/28  brain]
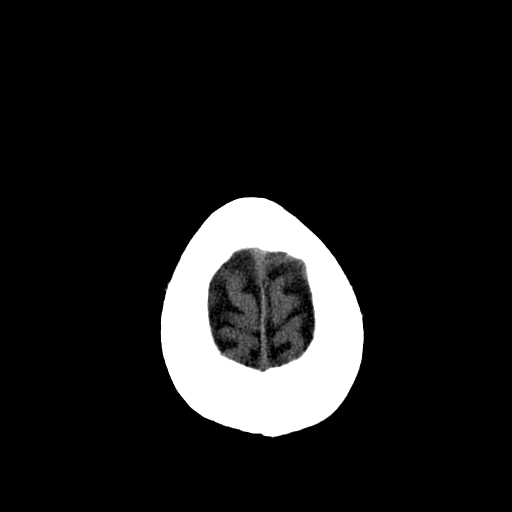
[im 26/28  brain]
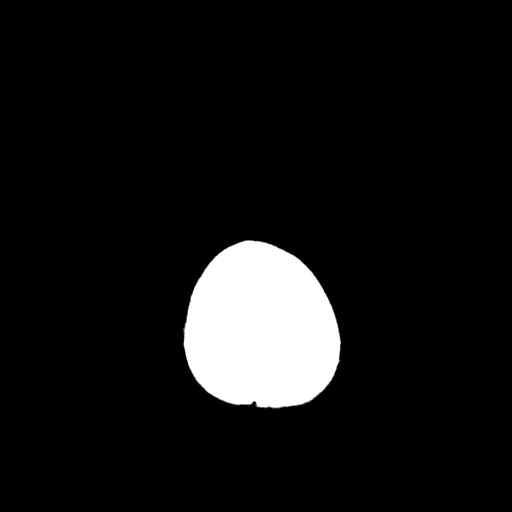
[im 26/28  bone]
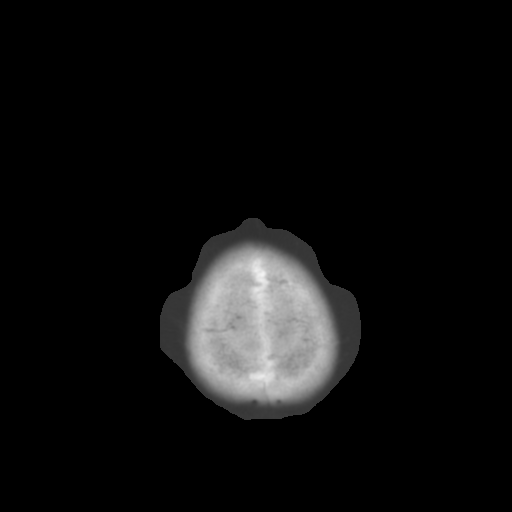

[15 of 30 positions shown; findings below may reference images not displayed]

FINDINGS: Mild diffuse atrophy is stable. There is no demonstrable mass,
hemorrhage, extra-axial fluid collection, or midline shift. Patchy
small vessel disease is noted in the centra semiovale bilaterally.
There is subtle decreased attenuation in the anterior superior right
temporal lobe immediately medial to the sylvian fissure, a subtle
change compared to the prior study, best seen on axial slice 11. A
recent infarct in this area is questioned. No other evidence
suggesting potential recent infarct is seen on this study. The
middle cerebral arteries appear symmetric with respect to density.

Bony calvarium appears intact.  The mastoid air cells are clear.
IMPRESSION: Question small recent infarct just medial to the right sylvian
fissure. There is atrophy with periventricular small vessel disease.
No hemorrhage.

## 2014-11-01 ENCOUNTER — Telehealth: Payer: Self-pay | Admitting: Internal Medicine

## 2014-11-01 NOTE — Telephone Encounter (Signed)
Dr. Olevia Perches please advise, pt calling for refill on Hydrocodone-acetominophine.

## 2014-11-03 NOTE — Telephone Encounter (Signed)
She can have it refilled every 30 days., Last refill  09/28/2014

## 2014-11-06 MED ORDER — HYDROCODONE-ACETAMINOPHEN 5-325 MG PO TABS: 1.0000 | ORAL_TABLET | Freq: Four times a day (QID) | ORAL | Status: DC | PRN

## 2014-11-06 NOTE — Telephone Encounter (Signed)
Calling to check on her refill request.

## 2014-11-06 NOTE — Telephone Encounter (Signed)
Patient informed that we will get the prescription ready to be signed and picked up.

## 2014-11-16 DIAGNOSIS — E139 Other specified diabetes mellitus without complications: Secondary | ICD-10-CM | POA: Diagnosis not present

## 2014-11-16 DIAGNOSIS — K922 Gastrointestinal hemorrhage, unspecified: Secondary | ICD-10-CM | POA: Diagnosis not present

## 2014-11-16 DIAGNOSIS — R32 Unspecified urinary incontinence: Secondary | ICD-10-CM | POA: Diagnosis not present

## 2014-11-16 DIAGNOSIS — I10 Essential (primary) hypertension: Secondary | ICD-10-CM | POA: Diagnosis not present

## 2014-12-06 ENCOUNTER — Other Ambulatory Visit: Payer: Self-pay

## 2014-12-06 MED ORDER — ESOMEPRAZOLE MAGNESIUM 40 MG PO CPDR: 40.0000 mg | DELAYED_RELEASE_CAPSULE | Freq: Every day | ORAL | Status: DC

## 2014-12-10 ENCOUNTER — Telehealth: Payer: Self-pay | Admitting: Internal Medicine

## 2014-12-10 ENCOUNTER — Telehealth: Payer: Self-pay

## 2014-12-10 ENCOUNTER — Other Ambulatory Visit: Payer: Self-pay | Admitting: Internal Medicine

## 2014-12-10 MED ORDER — HYDROCODONE-ACETAMINOPHEN 5-325 MG PO TABS: 1.0000 | ORAL_TABLET | Freq: Four times a day (QID) | ORAL | Status: DC | PRN

## 2014-12-10 NOTE — Telephone Encounter (Signed)
Received request for Lorazepam 0.5mg , will forward to Dr. Olevia Perches to advise.

## 2014-12-10 NOTE — Telephone Encounter (Signed)
Faxed this information back to [facility] , fax # [phone].

## 2014-12-10 NOTE — Telephone Encounter (Signed)
This Medication was discontinued on 3//28/2016. Np new refills.

## 2014-12-10 NOTE — Telephone Encounter (Signed)
Pharmacy stated esomeprazole had already been picked up. Patient stated that she didn't call about the esomeprazole she called about a refill on her lorazepam and hydrocodone. I told the patient that someone else took care of the lorazepam rx and a rx for hydrocodone was printed and placed on Dr. Nichola Sizer desk to be signed. Will call patient when rx is ready to be picked up.

## 2014-12-11 NOTE — Telephone Encounter (Signed)
Patient informed that Rx ready for pickup.

## 2014-12-12 ENCOUNTER — Telehealth: Payer: Self-pay | Admitting: Internal Medicine

## 2014-12-12 MED ORDER — LORAZEPAM 0.5 MG PO TABS: ORAL_TABLET | ORAL | Status: DC

## 2014-12-12 NOTE — Telephone Encounter (Signed)
Patient informed that rx faxed to [facility] as requested.

## 2014-12-12 NOTE — Telephone Encounter (Signed)
Please advise 

## 2014-12-12 NOTE — Telephone Encounter (Signed)
OK to refill Lorazepam, DB

## 2014-12-19 ENCOUNTER — Encounter: Payer: Self-pay | Admitting: Internal Medicine

## 2014-12-19 ENCOUNTER — Ambulatory Visit (INDEPENDENT_AMBULATORY_CARE_PROVIDER_SITE_OTHER): Payer: Medicare Other | Admitting: Internal Medicine

## 2014-12-19 VITALS — BP 120/62 | HR 80 | Ht 61.81 in | Wt 120.0 lb

## 2014-12-19 DIAGNOSIS — R110 Nausea: Secondary | ICD-10-CM | POA: Diagnosis not present

## 2014-12-19 DIAGNOSIS — R109 Unspecified abdominal pain: Secondary | ICD-10-CM | POA: Diagnosis not present

## 2014-12-19 DIAGNOSIS — Z9889 Other specified postprocedural states: Secondary | ICD-10-CM | POA: Diagnosis not present

## 2014-12-19 DIAGNOSIS — K3184 Gastroparesis: Secondary | ICD-10-CM | POA: Diagnosis not present

## 2014-12-19 DIAGNOSIS — Z903 Acquired absence of stomach [part of]: Secondary | ICD-10-CM

## 2014-12-19 MED ORDER — METRONIDAZOLE 250 MG PO TABS: 250.0000 mg | ORAL_TABLET | Freq: Three times a day (TID) | ORAL | Status: DC

## 2014-12-19 NOTE — Patient Instructions (Addendum)
You have been scheduled for an Upper GI/Small Bowel  Series on 12/24/2014 at [facility] at [place]  . Your appointment is ontPlease arrive 15 minutes prior to your test for registration. Make sure not to eat or drink anything after midnight on the night before your test. If you need to reschedule, please call radiology at [phone]. ________________________________________________________________ An upper GI series uses x rays to help diagnose problems of the upper GI tract, which includes the esophagus, stomach, and duodenum. The duodenum is the first part of the small intestine. An upper GI series is conducted by a radiology technologist or a radiologist-a doctor who specializes in x-ray imaging-at a hospital or outpatient center. While sitting or standing in front of an x-ray machine, the patient drinks barium liquid, which is often white and has a chalky consistency and taste. The barium liquid coats the lining of the upper GI tract and makes signs of disease show up more clearly on x rays. X-ray video, called fluoroscopy, is used to view the barium liquid moving through the esophagus, stomach, and duodenum. Additional x rays and fluoroscopy are performed while the patient lies on an x-ray table. To fully coat the upper GI tract with barium liquid, the technologist or radiologist may press on the abdomen or ask the patient to change position. Patients hold still in various positions, allowing the technologist or radiologist to take x rays of the upper GI tract at different angles. If a technologist conducts the upper GI series, a radiologist will later examine the images to look for problems.  This test typically takes about 1 hour to complete. __________________________________________________________________ We are sending in Flagyl to your pharmacy
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
## 2014-12-24 ENCOUNTER — Ambulatory Visit (HOSPITAL_COMMUNITY): Payer: Medicare Other

## 2014-12-24 ENCOUNTER — Ambulatory Visit (HOSPITAL_COMMUNITY)
Admission: RE | Admit: 2014-12-24 | Discharge: 2014-12-24 | Disposition: A | Discharge status: Home / Self Care | Payer: Medicare Other | Source: Ambulatory Visit | Attending: Internal Medicine | Admitting: Internal Medicine

## 2014-12-24 ENCOUNTER — Other Ambulatory Visit: Payer: Self-pay | Admitting: *Deleted

## 2014-12-24 DIAGNOSIS — K449 Diaphragmatic hernia without obstruction or gangrene: Secondary | ICD-10-CM | POA: Diagnosis not present

## 2014-12-24 DIAGNOSIS — K224 Dyskinesia of esophagus: Secondary | ICD-10-CM | POA: Diagnosis not present

## 2014-12-24 DIAGNOSIS — I6789 Other cerebrovascular disease: Secondary | ICD-10-CM | POA: Diagnosis not present

## 2014-12-24 DIAGNOSIS — R109 Unspecified abdominal pain: Secondary | ICD-10-CM

## 2014-12-24 DIAGNOSIS — E119 Type 2 diabetes mellitus without complications: Secondary | ICD-10-CM | POA: Diagnosis not present

## 2014-12-24 DIAGNOSIS — R1010 Upper abdominal pain, unspecified: Secondary | ICD-10-CM | POA: Diagnosis present

## 2014-12-24 DIAGNOSIS — K228 Other specified diseases of esophagus: Secondary | ICD-10-CM | POA: Insufficient documentation

## 2014-12-24 DIAGNOSIS — Z931 Gastrostomy status: Secondary | ICD-10-CM | POA: Diagnosis not present

## 2014-12-24 DIAGNOSIS — K589 Irritable bowel syndrome without diarrhea: Secondary | ICD-10-CM | POA: Diagnosis not present

## 2014-12-24 DIAGNOSIS — K219 Gastro-esophageal reflux disease without esophagitis: Secondary | ICD-10-CM | POA: Diagnosis not present

## 2014-12-24 DIAGNOSIS — F419 Anxiety disorder, unspecified: Secondary | ICD-10-CM | POA: Diagnosis not present

## 2014-12-24 DIAGNOSIS — I10 Essential (primary) hypertension: Secondary | ICD-10-CM | POA: Diagnosis not present

## 2014-12-24 MED ORDER — METOCLOPRAMIDE HCL 5 MG PO TABS: 5.0000 mg | ORAL_TABLET | Freq: Three times a day (TID) | ORAL | Status: DC

## 2014-12-24 MED ORDER — ESOMEPRAZOLE MAGNESIUM 40 MG PO CPDR: DELAYED_RELEASE_CAPSULE | ORAL | Status: DC

## 2014-12-25 ENCOUNTER — Other Ambulatory Visit: Payer: Self-pay | Admitting: *Deleted

## 2015-01-01 ENCOUNTER — Other Ambulatory Visit: Payer: Self-pay | Admitting: *Deleted

## 2015-01-14 ENCOUNTER — Telehealth: Payer: Self-pay | Admitting: Internal Medicine

## 2015-01-14 NOTE — Telephone Encounter (Signed)
I would not prescribe narcotics for this. She needs to see new GI MD or her PCP if needed.

## 2015-01-14 NOTE — Telephone Encounter (Signed)
Patient of Dr. Nichola Sizer that will be Dr. Woodward Ku patient. Hx gastroparesis and sub total gastrectomy. She reports she has had pain for the last 2-3 days after eating. She is asking for a presciption for Hydrocodone. DOD- Ardis Hughs. Please, advise.

## 2015-01-14 NOTE — Telephone Encounter (Signed)
Patient notified. Scheduled OV with Dr. Silverio Decamp on 03/08/15.

## 2015-01-18 DIAGNOSIS — I10 Essential (primary) hypertension: Secondary | ICD-10-CM | POA: Diagnosis not present

## 2015-01-18 DIAGNOSIS — K922 Gastrointestinal hemorrhage, unspecified: Secondary | ICD-10-CM | POA: Diagnosis not present

## 2015-01-18 DIAGNOSIS — R32 Unspecified urinary incontinence: Secondary | ICD-10-CM | POA: Diagnosis not present

## 2015-01-18 DIAGNOSIS — E139 Other specified diabetes mellitus without complications: Secondary | ICD-10-CM | POA: Diagnosis not present

## 2015-01-23 ENCOUNTER — Encounter: Payer: Self-pay | Admitting: Podiatry

## 2015-01-23 ENCOUNTER — Ambulatory Visit (INDEPENDENT_AMBULATORY_CARE_PROVIDER_SITE_OTHER): Payer: Medicare Other | Admitting: Podiatry

## 2015-01-23 DIAGNOSIS — M79676 Pain in unspecified toe(s): Secondary | ICD-10-CM | POA: Diagnosis not present

## 2015-01-23 DIAGNOSIS — B351 Tinea unguium: Secondary | ICD-10-CM | POA: Diagnosis not present

## 2015-01-23 NOTE — Patient Instructions (Addendum)
Your constant pain in your right and left feet could be associated with diabetic peripheral neuropathy discuss this with your primary care physician nature Schedule visit  Diabetes and Foot Care Diabetes may cause you to have problems because of poor blood supply (circulation) to your feet and legs. This may cause the skin on your feet to become thinner, break easier, and heal more slowly. Your skin may become dry, and the skin may peel and crack. You may also have nerve damage in your legs and feet causing decreased feeling in them. You may not notice minor injuries to your feet that could lead to infections or more serious problems. Taking care of your feet is one of the most important things you can do for yourself.  HOME CARE INSTRUCTIONS  Wear shoes at all times, even in the house. Do not go barefoot. Bare feet are easily injured.  Check your feet daily for blisters, cuts, and redness. If you cannot see the bottom of your feet, use a mirror or ask someone for help.  Wash your feet with warm water (do not use hot water) and mild soap. Then pat your feet and the areas between your toes until they are completely dry. Do not soak your feet as this can dry your skin.  Apply a moisturizing lotion or petroleum jelly (that does not contain alcohol and is unscented) to the skin on your feet and to dry, brittle toenails. Do not apply lotion between your toes.  Trim your toenails straight across. Do not dig under them or around the cuticle. File the edges of your nails with an emery board or nail file.  Do not cut corns or calluses or try to remove them with medicine.  Wear clean socks or stockings every day. Make sure they are not too tight. Do not wear knee-high stockings since they may decrease blood flow to your legs.  Wear shoes that fit properly and have enough cushioning. To break in new shoes, wear them for just a few hours a day. This prevents you from injuring your feet. Always look in  your shoes before you put them on to be sure there are no objects inside.  Do not cross your legs. This may decrease the blood flow to your feet.  If you find a minor scrape, cut, or break in the skin on your feet, keep it and the skin around it clean and dry. These areas may be cleansed with mild soap and water. Do not cleanse the area with peroxide, alcohol, or iodine.  When you remove an adhesive bandage, be sure not to damage the skin around it.  If you have a wound, look at it several times a day to make sure it is healing.  Do not use heating pads or hot water bottles. They may burn your skin. If you have lost feeling in your feet or legs, you may not know it is happening until it is too late.  Make sure your health care provider performs a complete foot exam at least annually or more often if you have foot problems. Report any cuts, sores, or bruises to your health care provider immediately. SEEK MEDICAL CARE IF:   You have an injury that is not healing.  You have cuts or breaks in the skin.  You have an ingrown nail.  You notice redness on your legs or feet.  You feel burning or tingling in your legs or feet.  You have pain or cramps in  your legs and feet.  Your legs or feet are numb.  Your feet always feel cold. SEEK IMMEDIATE MEDICAL CARE IF:   There is increasing redness, swelling, or pain in or around a wound.  There is a red line that goes up your leg.  Pus is coming from a wound.  You develop a fever or as directed by your health care provider.  You notice a bad smell coming from an ulcer or wound. Document Released: 04/17/2000 Document Revised: 12/21/2012 Document Reviewed: 09/27/2012 [facility] Patient Information 2015 [place], [place]. This information is not intended to replace advice given to you by your health care provider. Make sure you discuss any questions you have with your health care provider. Diabetes and Foot Care Diabetes may cause you to have  problems because of poor blood supply (circulation) to your feet and legs. This may cause the skin on your feet to become thinner, break easier, and heal more slowly. Your skin may become dry, and the skin may peel and crack. You may also have nerve damage in your legs and feet causing decreased feeling in them. You may not notice minor injuries to your feet that could lead to infections or more serious problems. Taking care of your feet is one of the most important things you can do for yourself.  HOME CARE INSTRUCTIONS  Wear shoes at all times, even in the house. Do not go barefoot. Bare feet are easily injured.  Check your feet daily for blisters, cuts, and redness. If you cannot see the bottom of your feet, use a mirror or ask someone for help.  Wash your feet with warm water (do not use hot water) and mild soap. Then pat your feet and the areas between your toes until they are completely dry. Do not soak your feet as this can dry your skin.  Apply a moisturizing lotion or petroleum jelly (that does not contain alcohol and is unscented) to the skin on your feet and to dry, brittle toenails. Do not apply lotion between your toes.  Trim your toenails straight across. Do not dig under them or around the cuticle. File the edges of your nails with an emery board or nail file.  Do not cut corns or calluses or try to remove them with medicine.  Wear clean socks or stockings every day. Make sure they are not too tight. Do not wear knee-high stockings since they may decrease blood flow to your legs.  Wear shoes that fit properly and have enough cushioning. To break in new shoes, wear them for just a few hours a day. This prevents you from injuring your feet. Always look in your shoes before you put them on to be sure there are no objects inside.  Do not cross your legs. This may decrease the blood flow to your feet.  If you find a minor scrape, cut, or break in the skin on your feet, keep it and the  skin around it clean and dry. These areas may be cleansed with mild soap and water. Do not cleanse the area with peroxide, alcohol, or iodine.  When you remove an adhesive bandage, be sure not to damage the skin around it.  If you have a wound, look at it several times a day to make sure it is healing.  Do not use heating pads or hot water bottles. They may burn your skin. If you have lost feeling in your feet or legs, you may not know it is happening  until it is too late.  Make sure your health care provider performs a complete foot exam at least annually or more often if you have foot problems. Report any cuts, sores, or bruises to your health care provider immediately. SEEK MEDICAL CARE IF:   You have an injury that is not healing.  You have cuts or breaks in the skin.  You have an ingrown nail.  You notice redness on your legs or feet.  You feel burning or tingling in your legs or feet.  You have pain or cramps in your legs and feet.  Your legs or feet are numb.  Your feet always feel cold. SEEK IMMEDIATE MEDICAL CARE IF:   There is increasing redness, swelling, or pain in or around a wound.  There is a red line that goes up your leg.  Pus is coming from a wound.  You develop a fever or as directed by your health care provider.  You notice a bad smell coming from an ulcer or wound. Document Released: 04/17/2000 Document Revised: 12/21/2012 Document Reviewed: 09/27/2012 [facility] Patient Information 2015 [place], [place]. This information is not intended to replace advice given to you by your health care provider. Make sure you discuss any questions you have with your health care provider.

## 2015-01-24 NOTE — Progress Notes (Signed)
Patient ID: Emily [name], female   DOB: [birth year], 74 y.o.   MRN: [id number]  Subjective: This patient presents for a scheduled visit complaining of painful toenails her request nail debridement  Objective: Orientated 3 No open skin lesions noted bilaterally The toenails are elongated, brittle, discolored, incurvated, hypertrophic and tender to direct palpation 6-10  Assessment: Type II diabetic without complication Symptomatic onychomycoses 6-10  Plan: Debridement toenails 10 and mechanically an electrical the without any bleeding  Reappoint 3 months

## 2015-02-03 IMAGING — CR DG LUMBAR SPINE COMPLETE 4+V
5 series · 5 of 5 positions shown · non-contrast
Comparison: None.

CLINICAL DATA: Lumbago

EXAM:
LUMBAR SPINE - COMPLETE 4+ VIEW

[t l-spine a.p.]
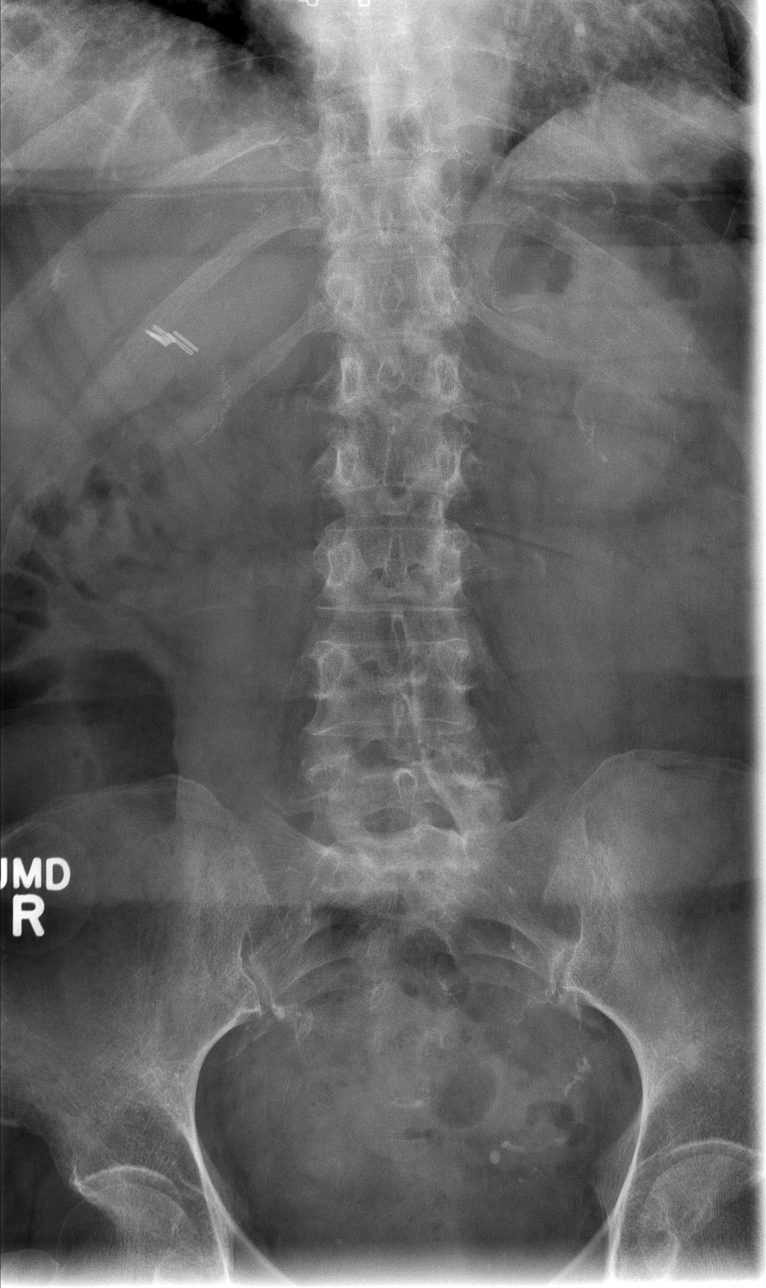

[t l-spine lat]
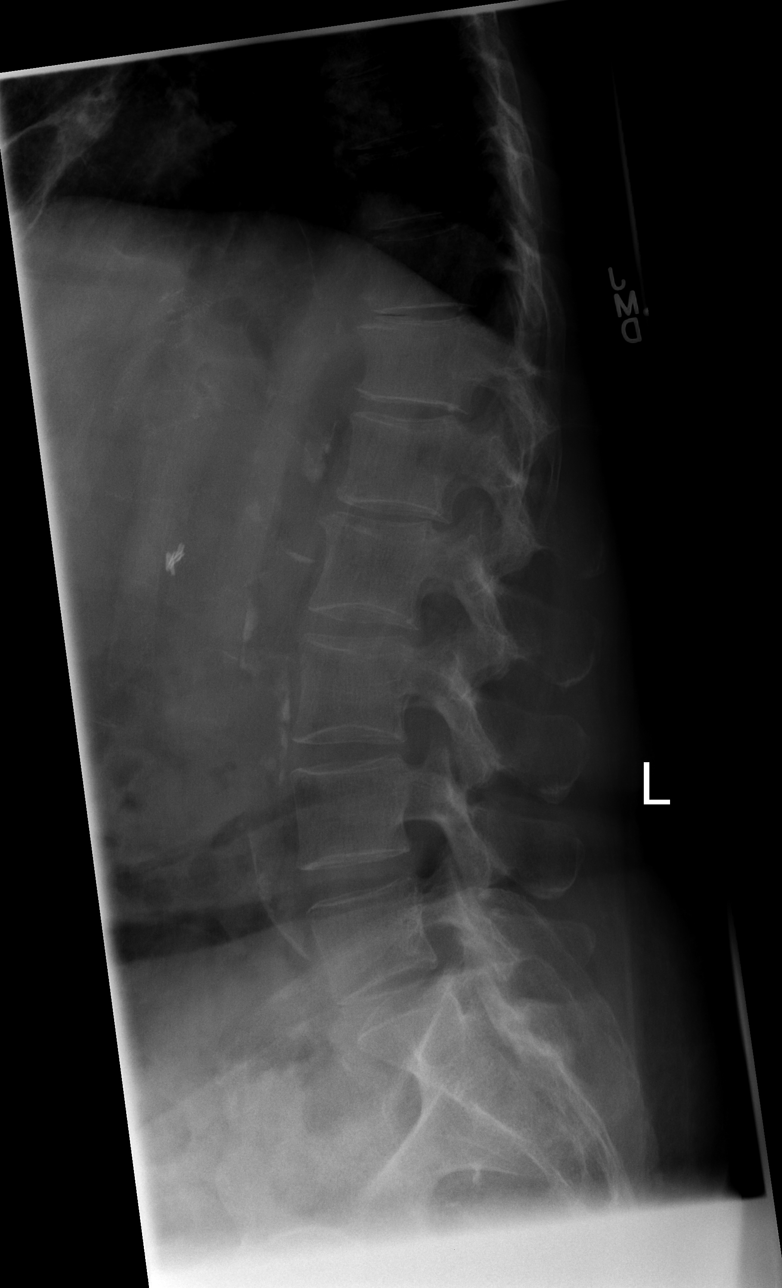

[t l-spine l5-s1 spot]
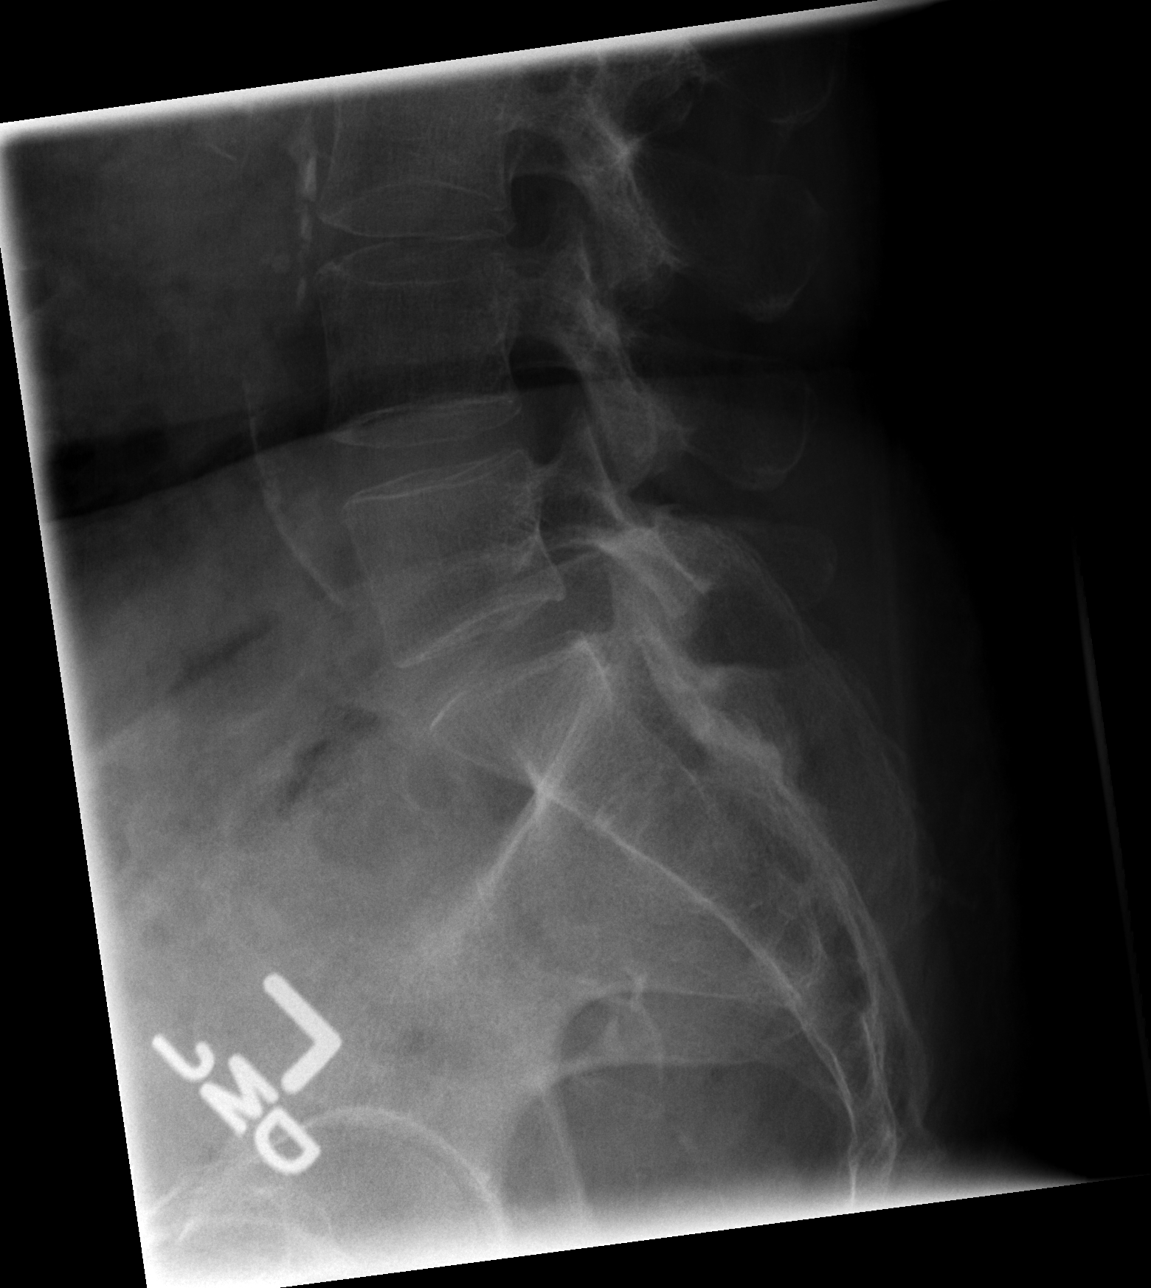

[t l-spine oblique exposure (1 of 2)]
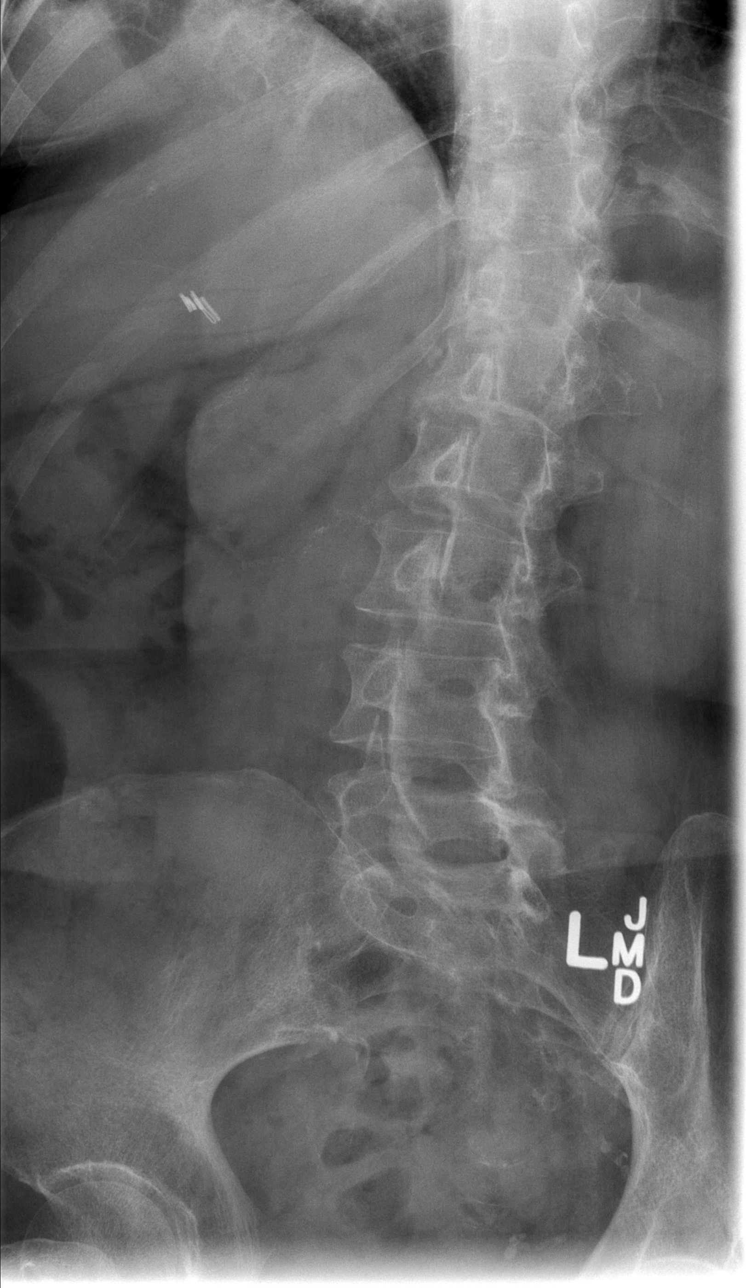

[t l-spine oblique exposure (2 of 2)]
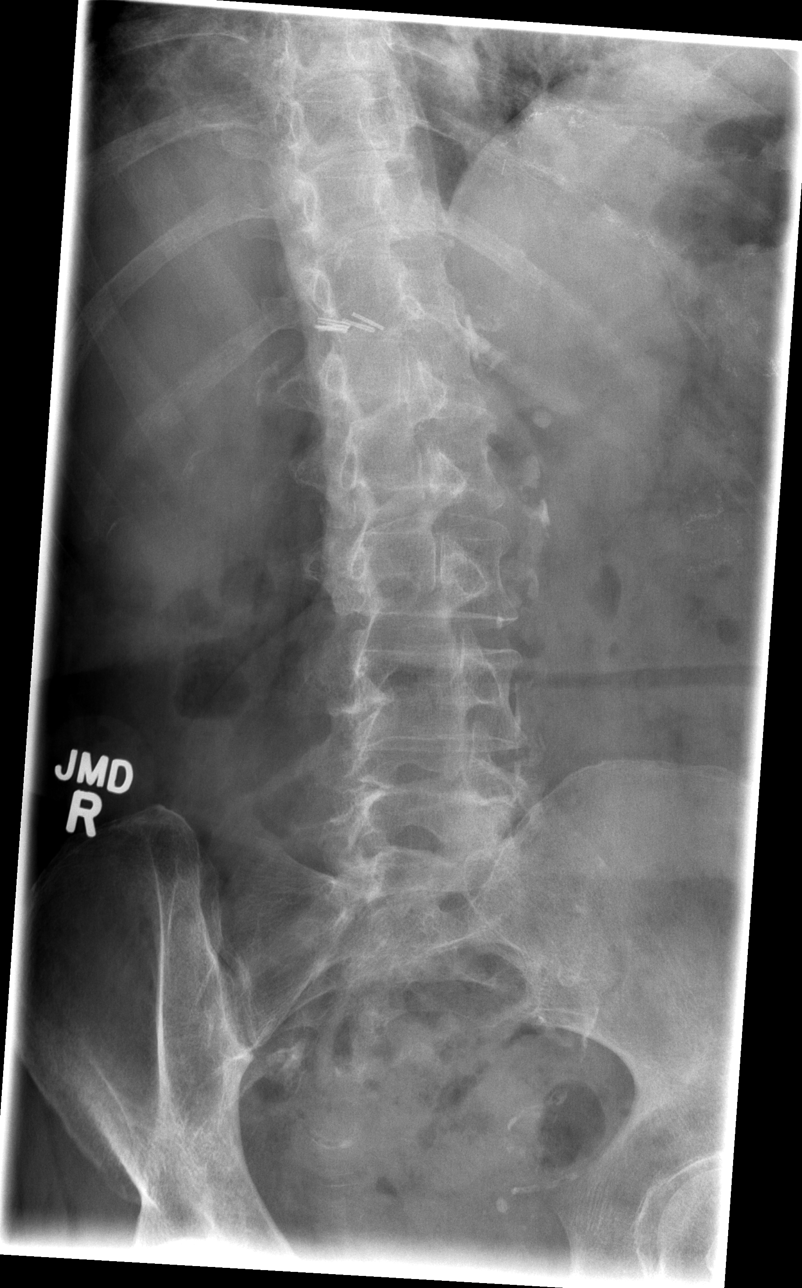

[5 of 5 positions shown; findings below may reference images not displayed]

FINDINGS: There is no evidence of lumbar spine fracture. Alignment is normal.
Intervertebral disc spaces are maintained. Atherosclerotic
calcifications within the aorta. There are findings suspicious for a
7 mm lower pole left renal calculus.
IMPRESSION: No acute osseous abnormalities.

Findings which may represent the 7 mm lower pole left renal
calculus.

## 2015-02-04 DIAGNOSIS — E1040 Type 1 diabetes mellitus with diabetic neuropathy, unspecified: Secondary | ICD-10-CM | POA: Diagnosis not present

## 2015-02-04 DIAGNOSIS — K589 Irritable bowel syndrome without diarrhea: Secondary | ICD-10-CM | POA: Diagnosis not present

## 2015-02-04 DIAGNOSIS — I10 Essential (primary) hypertension: Secondary | ICD-10-CM | POA: Diagnosis not present

## 2015-02-04 DIAGNOSIS — E119 Type 2 diabetes mellitus without complications: Secondary | ICD-10-CM | POA: Diagnosis not present

## 2015-02-04 DIAGNOSIS — Z23 Encounter for immunization: Secondary | ICD-10-CM | POA: Diagnosis not present

## 2015-02-22 ENCOUNTER — Telehealth: Payer: Self-pay | Admitting: Gastroenterology

## 2015-02-22 NOTE — Telephone Encounter (Signed)
Spoke with the patient. She has an appointment on 03/08/15 with Dr Silverio Decamp. Her pain is the same that she has had chronically. Denies any new symptoms. She is taking her Nexium, uses Bentyl PRN and still has Hydrocodone available. No vomiting, no bloody stools. She understands to go to the ER if she acutely worsens. Otherwise she is to keep the upcoming appointment.

## 2015-02-23 ENCOUNTER — Emergency Department (HOSPITAL_COMMUNITY): Payer: Medicare Other

## 2015-02-23 ENCOUNTER — Emergency Department (HOSPITAL_COMMUNITY)
Admission: EM | Admit: 2015-02-23 | Discharge: 2015-02-23 | Disposition: A | Discharge status: Home / Self Care | Payer: Medicare Other | Attending: Emergency Medicine | Admitting: Emergency Medicine

## 2015-02-23 DIAGNOSIS — Z7982 Long term (current) use of aspirin: Secondary | ICD-10-CM | POA: Diagnosis not present

## 2015-02-23 DIAGNOSIS — E119 Type 2 diabetes mellitus without complications: Secondary | ICD-10-CM | POA: Insufficient documentation

## 2015-02-23 DIAGNOSIS — K5900 Constipation, unspecified: Secondary | ICD-10-CM | POA: Insufficient documentation

## 2015-02-23 DIAGNOSIS — R109 Unspecified abdominal pain: Secondary | ICD-10-CM | POA: Diagnosis not present

## 2015-02-23 DIAGNOSIS — Z8669 Personal history of other diseases of the nervous system and sense organs: Secondary | ICD-10-CM | POA: Diagnosis not present

## 2015-02-23 DIAGNOSIS — R42 Dizziness and giddiness: Secondary | ICD-10-CM | POA: Diagnosis present

## 2015-02-23 DIAGNOSIS — F329 Major depressive disorder, single episode, unspecified: Secondary | ICD-10-CM | POA: Insufficient documentation

## 2015-02-23 DIAGNOSIS — Z720 Tobacco use: Secondary | ICD-10-CM | POA: Diagnosis not present

## 2015-02-23 DIAGNOSIS — Z792 Long term (current) use of antibiotics: Secondary | ICD-10-CM | POA: Insufficient documentation

## 2015-02-23 DIAGNOSIS — K219 Gastro-esophageal reflux disease without esophagitis: Secondary | ICD-10-CM | POA: Diagnosis not present

## 2015-02-23 DIAGNOSIS — R52 Pain, unspecified: Secondary | ICD-10-CM

## 2015-02-23 DIAGNOSIS — I10 Essential (primary) hypertension: Secondary | ICD-10-CM | POA: Diagnosis not present

## 2015-02-23 DIAGNOSIS — Z79899 Other long term (current) drug therapy: Secondary | ICD-10-CM | POA: Diagnosis not present

## 2015-02-23 DIAGNOSIS — Z87828 Personal history of other (healed) physical injury and trauma: Secondary | ICD-10-CM | POA: Diagnosis not present

## 2015-02-23 DIAGNOSIS — R1084 Generalized abdominal pain: Secondary | ICD-10-CM

## 2015-02-23 LAB — COMPREHENSIVE METABOLIC PANEL
ALT: 31 U/L (ref 14–54)
AST: 33 U/L (ref 15–41)
Albumin: 4.3 g/dL (ref 3.5–5.0)
Alkaline Phosphatase: 89 U/L (ref 38–126)
Anion gap: 9 (ref 5–15)
BUN: 16 mg/dL (ref 6–20)
CO2: 23 mmol/L (ref 22–32)
Calcium: 9 mg/dL (ref 8.9–10.3)
Chloride: 109 mmol/L (ref 101–111)
Creatinine, Ser: 0.72 mg/dL (ref 0.44–1.00)
GFR calc Af Amer: >60 mL/min (ref >60)
GFR calc non Af Amer: >60 mL/min (ref >60)
Glucose, Bld: 89 mg/dL (ref 65–99)
Potassium: 4 mmol/L (ref 3.5–5.1)
Sodium: 141 mmol/L (ref 135–145)
Total Bilirubin: 0.4 mg/dL (ref 0.3–1.2)
Total Protein: 7.3 g/dL (ref 6.5–8.1)

## 2015-02-23 LAB — URINALYSIS, ROUTINE W REFLEX MICROSCOPIC
Glucose, UA: NEGATIVE mg/dL
Hgb urine dipstick: NEGATIVE
Ketones, ur: NEGATIVE mg/dL
Nitrite: NEGATIVE
Protein, ur: NEGATIVE mg/dL
Specific Gravity, Urine: 1.026 (ref 1.005–1.030)
Urobilinogen, UA: 1 mg/dL (ref 0.0–1.0)
pH: 6 (ref 5.0–8.0)

## 2015-02-23 LAB — CBC WITH DIFFERENTIAL/PLATELET
Basophils Absolute: 0 10*3/uL (ref 0.0–0.1)
Basophils Relative: 0 %
Eosinophils Absolute: 0.1 10*3/uL (ref 0.0–0.7)
Eosinophils Relative: 1 %
HCT: 39 % (ref 36.0–46.0)
Hemoglobin: 12.4 g/dL (ref 12.0–15.0)
Lymphocytes Relative: 35 %
Lymphs Abs: 2.8 10*3/uL (ref 0.7–4.0)
MCH: 23.9 pg — ABNORMAL LOW (ref 26.0–34.0)
MCHC: 31.8 g/dL (ref 30.0–36.0)
MCV: 75.1 fL — ABNORMAL LOW (ref 78.0–100.0)
Monocytes Absolute: 0.7 10*3/uL (ref 0.1–1.0)
Monocytes Relative: 9 %
Neutro Abs: 4.5 10*3/uL (ref 1.7–7.7)
Neutrophils Relative %: 55 %
Platelets: 211 10*3/uL (ref 150–400)
RBC: 5.19 MIL/uL — ABNORMAL HIGH (ref 3.87–5.11)
RDW: 14.5 % (ref 11.5–15.5)
WBC: 8.1 10*3/uL (ref 4.0–10.5)

## 2015-02-23 LAB — URINE MICROSCOPIC-ADD ON

## 2015-02-23 LAB — LIPASE, BLOOD: Lipase: 34 U/L (ref 11–51)

## 2015-02-23 MED ORDER — ONDANSETRON HCL 4 MG/2ML IJ SOLN
4.0000 mg | Freq: Once | INTRAMUSCULAR | Status: AC
  Administered 2015-02-23: 4 mg via INTRAVENOUS
  Filled 2015-02-23: qty 2

## 2015-02-23 MED ORDER — POLYETHYLENE GLYCOL 3350 17 GM/SCOOP PO POWD: 17.0000 g | Freq: Two times a day (BID) | ORAL | Status: DC

## 2015-02-23 MED ORDER — FLEET ENEMA 7-19 GM/118ML RE ENEM: 1.0000 | ENEMA | Freq: Once | RECTAL | Status: DC

## 2015-02-23 MED ORDER — DOCUSATE SODIUM 100 MG PO CAPS: 100.0000 mg | ORAL_CAPSULE | Freq: Two times a day (BID) | ORAL | Status: DC

## 2015-02-23 MED ORDER — MORPHINE SULFATE (PF) 4 MG/ML IV SOLN
4.0000 mg | Freq: Once | INTRAVENOUS | Status: AC
  Administered 2015-02-23: 4 mg via INTRAVENOUS
  Filled 2015-02-23: qty 1
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
## 2015-02-23 NOTE — Discharge Instructions (Signed)
Abdominal Pain, Adult °Many things can cause abdominal pain. Usually, abdominal pain is not caused by a disease and will improve without treatment. It can often be observed and treated at home. Your health care provider will do a physical exam and possibly order blood tests and X-rays to help determine the seriousness of your pain. However, in many cases, more time must pass before a clear cause of the pain can be found. Before that point, your health care provider may not know if you need more testing or further treatment. °HOME CARE INSTRUCTIONS °Monitor your abdominal pain for any changes. The following actions may help to alleviate any discomfort you are experiencing: °· Only take over-the-counter or prescription medicines as directed by your health care provider. °· Do not take laxatives unless directed to do so by your health care provider. °· Try a clear liquid diet (broth, tea, or water) as directed by your health care provider. Slowly move to a bland diet as tolerated. °SEEK MEDICAL CARE IF: °· You have unexplained abdominal pain. °· You have abdominal pain associated with nausea or diarrhea. °· You have pain when you urinate or have a bowel movement. °· You experience abdominal pain that wakes you in the night. °· You have abdominal pain that is worsened or improved by eating food. °· You have abdominal pain that is worsened with eating fatty foods. °· You have a fever. °SEEK IMMEDIATE MEDICAL CARE IF: °· Your pain does not go away within 2 hours. °· You keep throwing up (vomiting). °· Your pain is felt only in portions of the abdomen, such as the right side or the left lower portion of the abdomen. °· You pass bloody or black tarry stools. °MAKE SURE YOU: °· Understand these instructions. °· Will watch your condition. °· Will get help right away if you are not doing well or get worse. °  °This information is not intended to replace advice given to you by your health care provider. Make sure you discuss  any questions you have with your health care provider. °  °Document Released: 01/28/2005 Document Revised: 01/09/2015 Document Reviewed: 12/28/2012 °Elsevier Interactive Patient Education ©2016 Elsevier Inc[employer] ° °Constipation, Adult °Constipation is when a person has fewer than three bowel movements a week, has difficulty having a bowel movement, or has stools that are dry, hard, or larger than normal. As people grow older, constipation is more common. A low-fiber diet, not taking in enough fluids, and taking certain medicines may make constipation worse.  °CAUSES  °· Certain medicines, such as antidepressants, pain medicine, iron supplements, antacids, and water pills.   °· Certain diseases, such as diabetes, irritable bowel syndrome (IBS), thyroid disease, or depression.   °· Not drinking enough water.   °· Not eating enough fiber-rich foods.   °· Stress or travel.   °· Lack of physical activity or exercise.   °· Ignoring the urge to have a bowel movement.   °· Using laxatives too much.   °SIGNS AND SYMPTOMS  °· Having fewer than three bowel movements a week.   °· Straining to have a bowel movement.   °· Having stools that are hard, dry, or larger than normal.   °· Feeling full or bloated.   °· Pain in the lower abdomen.   °· Not feeling relief after having a bowel movement.   °DIAGNOSIS  °Your health care provider will take a medical history and perform a physical exam. Further testing may be done for severe constipation. Some tests may include: °· A barium enema X-ray to examine your rectum, colon, and, sometimes,   your small intestine.   °· A sigmoidoscopy to examine your lower colon.   °· A colonoscopy to examine your entire colon. °TREATMENT  °Treatment will depend on the severity of your constipation and what is causing it. Some dietary treatments include drinking more fluids and eating more fiber-rich foods. Lifestyle treatments may include regular exercise. If these diet and lifestyle recommendations do not  help, your health care provider may recommend taking over-the-counter laxative medicines to help you have bowel movements. Prescription medicines may be prescribed if over-the-counter medicines do not work.  °HOME CARE INSTRUCTIONS  °· Eat foods that have a lot of fiber, such as fruits, vegetables, whole grains, and beans. °· Limit foods high in fat and processed sugars, such as french fries, hamburgers, cookies, candies, and soda.   °· A fiber supplement may be added to your diet if you cannot get enough fiber from foods.   °· Drink enough fluids to keep your urine clear or pale yellow.   °· Exercise regularly or as directed by your health care provider.   °· Go to the restroom when you have the urge to go. Do not hold it.   °· Only take over-the-counter or prescription medicines as directed by your health care provider. Do not take other medicines for constipation without talking to your health care provider first.   °SEEK IMMEDIATE MEDICAL CARE IF:  °· You have bright red blood in your stool.   °· Your constipation lasts for more than 4 days or gets worse.   °· You have abdominal or rectal pain.   °· You have thin, pencil-like stools.   °· You have unexplained weight loss. °MAKE SURE YOU:  °· Understand these instructions. °· Will watch your condition. °· Will get help right away if you are not doing well or get worse. °  °This information is not intended to replace advice given to you by your health care provider. Make sure you discuss any questions you have with your health care provider. °  °Document Released: 01/17/2004 Document Revised: 05/11/2014 Document Reviewed: 01/30/2013 °Elsevier Interactive Patient Education ©2016 Elsevier Inc. ° °

## 2015-02-23 NOTE — ED Notes (Signed)
Pt reported abd pain since Tuesday.

## 2015-02-23 NOTE — ED Notes (Signed)
Pt. Complaining of dizziness and 7/10 abdominal pain that does not stay in one place. It is aggravated by movement, and eating makes her nauseous. Hx. of multiple abdominal surgeries.

## 2015-02-24 LAB — CBG MONITORING, ED: Glucose-Capillary: 85 mg/dL (ref 65–99)

## 2015-02-25 ENCOUNTER — Telehealth: Payer: Self-pay | Admitting: Gastroenterology

## 2015-02-25 NOTE — Telephone Encounter (Signed)
Please see the ER note and her CT abdomen/pelvis. The patient is very constipated. She has had minimal results with the Fleets enema. She took Miralax twice yesterday and is drinking it again right now. What has prompted her phone call is the blood with the slight bowel movement after her enema. Denies "free bleeding" or pain. The blood was on;y with the bm and none since. She is still very constipated.

## 2015-02-26 ENCOUNTER — Telehealth: Payer: Self-pay

## 2015-02-26 MED ORDER — ESOMEPRAZOLE MAGNESIUM 40 MG PO CPDR: 40.0000 mg | DELAYED_RELEASE_CAPSULE | Freq: Two times a day (BID) | ORAL | Status: DC

## 2015-02-26 NOTE — Telephone Encounter (Signed)
Please advise her to use Dulcolax suppository at bedtime x 5 days followed by as needed.  Ok to take 1 bottle of Mag citrate and will need to continue with Miralax 1 packet twice daily.

## 2015-02-26 NOTE — Telephone Encounter (Signed)
Should she use Mag. Citrate? Or repeat the enema?

## 2015-02-26 NOTE — Telephone Encounter (Signed)
Ok to refill. Thanks 

## 2015-02-26 NOTE — Telephone Encounter (Signed)
Medication has been filled with 1 Refill until follow up.

## 2015-02-26 NOTE — Telephone Encounter (Signed)
Incoming fax from [facility] for refills on Esomeprazole 40mg /BID. Pt has a follow up in clinic on 03-08-15. Please advise on refills.

## 2015-02-26 NOTE — Telephone Encounter (Signed)
Patient is advised. She decline Mag Citrate, but agrees to everything else. She has had 3 small bowel movements since last night, but still feels bloated.

## 2015-02-27 DIAGNOSIS — H401211 Low-tension glaucoma, right eye, mild stage: Secondary | ICD-10-CM | POA: Diagnosis not present

## 2015-02-27 DIAGNOSIS — E119 Type 2 diabetes mellitus without complications: Secondary | ICD-10-CM | POA: Diagnosis not present

## 2015-02-27 DIAGNOSIS — H401231 Low-tension glaucoma, bilateral, mild stage: Secondary | ICD-10-CM | POA: Diagnosis not present

## 2015-02-27 DIAGNOSIS — H401221 Low-tension glaucoma, left eye, mild stage: Secondary | ICD-10-CM | POA: Diagnosis not present

## 2015-02-27 DIAGNOSIS — H524 Presbyopia: Secondary | ICD-10-CM | POA: Diagnosis not present

## 2015-03-08 ENCOUNTER — Encounter: Payer: Self-pay | Admitting: Gastroenterology

## 2015-03-08 ENCOUNTER — Ambulatory Visit (INDEPENDENT_AMBULATORY_CARE_PROVIDER_SITE_OTHER): Payer: Medicare Other | Admitting: Gastroenterology

## 2015-03-08 VITALS — BP 120/60 | HR 72 | Ht 62.0 in | Wt 116.8 lb

## 2015-03-08 DIAGNOSIS — K3184 Gastroparesis: Secondary | ICD-10-CM | POA: Diagnosis not present

## 2015-03-08 DIAGNOSIS — K219 Gastro-esophageal reflux disease without esophagitis: Secondary | ICD-10-CM

## 2015-03-08 MED ORDER — PROMETHAZINE HCL 25 MG PO TABS: ORAL_TABLET | ORAL | Status: DC

## 2015-03-08 MED ORDER — LORAZEPAM 0.5 MG PO TABS: ORAL_TABLET | ORAL | Status: DC

## 2015-03-08 NOTE — Patient Instructions (Addendum)
We have sent your refills to your pharmacy [facility]  We have given you a printed prescription of Lorazepam Any further refills has to come from your PCP  [facility] for an appointment information given to patient
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
## 2015-03-18 DIAGNOSIS — I10 Essential (primary) hypertension: Secondary | ICD-10-CM | POA: Diagnosis not present

## 2015-03-18 DIAGNOSIS — E1140 Type 2 diabetes mellitus with diabetic neuropathy, unspecified: Secondary | ICD-10-CM | POA: Diagnosis not present

## 2015-03-18 DIAGNOSIS — K589 Irritable bowel syndrome without diarrhea: Secondary | ICD-10-CM | POA: Diagnosis not present

## 2015-03-18 DIAGNOSIS — M545 Low back pain: Secondary | ICD-10-CM | POA: Diagnosis not present

## 2015-03-19 DIAGNOSIS — M47819 Spondylosis without myelopathy or radiculopathy, site unspecified: Secondary | ICD-10-CM | POA: Diagnosis not present

## 2015-03-19 DIAGNOSIS — M8588 Other specified disorders of bone density and structure, other site: Secondary | ICD-10-CM | POA: Diagnosis not present

## 2015-04-02 DIAGNOSIS — K922 Gastrointestinal hemorrhage, unspecified: Secondary | ICD-10-CM | POA: Diagnosis not present

## 2015-04-02 DIAGNOSIS — E139 Other specified diabetes mellitus without complications: Secondary | ICD-10-CM | POA: Diagnosis not present

## 2015-04-02 DIAGNOSIS — H401231 Low-tension glaucoma, bilateral, mild stage: Secondary | ICD-10-CM | POA: Diagnosis not present

## 2015-04-02 DIAGNOSIS — R32 Unspecified urinary incontinence: Secondary | ICD-10-CM | POA: Diagnosis not present

## 2015-04-02 DIAGNOSIS — H04123 Dry eye syndrome of bilateral lacrimal glands: Secondary | ICD-10-CM | POA: Diagnosis not present

## 2015-04-02 DIAGNOSIS — I10 Essential (primary) hypertension: Secondary | ICD-10-CM | POA: Diagnosis not present

## 2015-04-19 DIAGNOSIS — Z1389 Encounter for screening for other disorder: Secondary | ICD-10-CM | POA: Diagnosis not present

## 2015-04-19 DIAGNOSIS — E119 Type 2 diabetes mellitus without complications: Secondary | ICD-10-CM | POA: Diagnosis not present

## 2015-04-19 DIAGNOSIS — M545 Low back pain: Secondary | ICD-10-CM | POA: Diagnosis not present

## 2015-04-19 DIAGNOSIS — R002 Palpitations: Secondary | ICD-10-CM | POA: Diagnosis not present

## 2015-04-19 DIAGNOSIS — I10 Essential (primary) hypertension: Secondary | ICD-10-CM | POA: Diagnosis not present

## 2015-04-30 ENCOUNTER — Telehealth: Payer: Self-pay | Admitting: Cardiology

## 2015-04-30 NOTE — Telephone Encounter (Signed)
Received records from [facility] for appointment with Dr Percival Spanish on 05/27/15.  Records given to [facility] (medical records) for Dr Hochrein's schedule on 05/27/15. lp

## 2015-05-01 ENCOUNTER — Ambulatory Visit (INDEPENDENT_AMBULATORY_CARE_PROVIDER_SITE_OTHER): Payer: Medicare Other | Admitting: Podiatry

## 2015-05-01 ENCOUNTER — Encounter: Payer: Self-pay | Admitting: Podiatry

## 2015-05-01 DIAGNOSIS — M79676 Pain in unspecified toe(s): Secondary | ICD-10-CM | POA: Diagnosis not present

## 2015-05-01 DIAGNOSIS — B351 Tinea unguium: Secondary | ICD-10-CM | POA: Diagnosis not present

## 2015-05-01 NOTE — Progress Notes (Signed)
Patient ID: Emily [name], female   DOB: 1940-11-25, 74 y.o.   MRN: [id number]  Subjective: This patient presents today for scheduled visit complaining of elongated and thickened toenails which from cough walking wearing shoes and request nail debridement  Objective: Orientated 3 No open skin lesions bilaterally The toenails are hypertrophic, incurvated, lateral, discolored and tender to direct palpation 6-10  Assessment: Type II diabetic without foot complications Symptomatic onychomycoses 6-10  Plan: Diprolene toenails 6-10 mechanically and electrically without any bleeding  Reappoint 3 months

## 2015-05-01 NOTE — Patient Instructions (Signed)
Diabetes and Foot Care Diabetes may cause you to have problems because of poor blood supply (circulation) to your feet and legs. This may cause the skin on your feet to become thinner, break easier, and heal more slowly. Your skin may become dry, and the skin may peel and crack. You may also have nerve damage in your legs and feet causing decreased feeling in them. You may not notice minor injuries to your feet that could lead to infections or more serious problems. Taking care of your feet is one of the most important things you can do for yourself.  HOME CARE INSTRUCTIONS  Wear shoes at all times, even in the house. Do not go barefoot. Bare feet are easily injured.  Check your feet daily for blisters, cuts, and redness. If you cannot see the bottom of your feet, use a mirror or ask someone for help.  Wash your feet with warm water (do not use hot water) and mild soap. Then pat your feet and the areas between your toes until they are completely dry. Do not soak your feet as this can dry your skin.  Apply a moisturizing lotion or petroleum jelly (that does not contain alcohol and is unscented) to the skin on your feet and to dry, brittle toenails. Do not apply lotion between your toes.  Trim your toenails straight across. Do not dig under them or around the cuticle. File the edges of your nails with an emery board or nail file.  Do not cut corns or calluses or try to remove them with medicine.  Wear clean socks or stockings every day. Make sure they are not too tight. Do not wear knee-high stockings since they may decrease blood flow to your legs.  Wear shoes that fit properly and have enough cushioning. To break in new shoes, wear them for just a few hours a day. This prevents you from injuring your feet. Always look in your shoes before you put them on to be sure there are no objects inside.  Do not cross your legs. This may decrease the blood flow to your feet.  If you find a minor scrape,  cut, or break in the skin on your feet, keep it and the skin around it clean and dry. These areas may be cleansed with mild soap and water. Do not cleanse the area with peroxide, alcohol, or iodine.  When you remove an adhesive bandage, be sure not to damage the skin around it.  If you have a wound, look at it several times a day to make sure it is healing.  Do not use heating pads or hot water bottles. They may burn your skin. If you have lost feeling in your feet or legs, you may not know it is happening until it is too late.  Make sure your health care provider performs a complete foot exam at least annually or more often if you have foot problems. Report any cuts, sores, or bruises to your health care provider immediately. SEEK MEDICAL CARE IF:   You have an injury that is not healing.  You have cuts or breaks in the skin.  You have an ingrown nail.  You notice redness on your legs or feet.  You feel burning or tingling in your legs or feet.  You have pain or cramps in your legs and feet.  Your legs or feet are numb.  Your feet always feel cold. SEEK IMMEDIATE MEDICAL CARE IF:   There is increasing redness,   swelling, or pain in or around a wound.  There is a red line that goes up your leg.  Pus is coming from a wound.  You develop a fever or as directed by your health care provider.  You notice a bad smell coming from an ulcer or wound.   This information is not intended to replace advice given to you by your health care provider. Make sure you discuss any questions you have with your health care provider.   Document Released: 04/17/2000 Document Revised: 12/21/2012 Document Reviewed: 09/27/2012 Elsevier Interactive Patient Education 2016 [employer]  

## 2015-05-10 ENCOUNTER — Telehealth: Payer: Self-pay | Admitting: *Deleted

## 2015-05-10 MED ORDER — ESOMEPRAZOLE MAGNESIUM 40 MG PO CPDR: 40.0000 mg | DELAYED_RELEASE_CAPSULE | Freq: Two times a day (BID) | ORAL | Status: DC

## 2015-05-10 NOTE — Telephone Encounter (Signed)
Med sent to pharmacy from fax request
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
## 2015-05-27 ENCOUNTER — Ambulatory Visit: Payer: Medicare Other

## 2015-05-27 ENCOUNTER — Ambulatory Visit (INDEPENDENT_AMBULATORY_CARE_PROVIDER_SITE_OTHER): Payer: Medicare Other | Admitting: Cardiology

## 2015-05-27 ENCOUNTER — Encounter: Payer: Self-pay | Admitting: Cardiology

## 2015-05-27 VITALS — BP 146/78 | HR 71 | Ht 62.0 in | Wt 123.0 lb

## 2015-05-27 DIAGNOSIS — I10 Essential (primary) hypertension: Secondary | ICD-10-CM

## 2015-05-27 DIAGNOSIS — R002 Palpitations: Secondary | ICD-10-CM | POA: Diagnosis not present

## 2015-05-27 DIAGNOSIS — I679 Cerebrovascular disease, unspecified: Secondary | ICD-10-CM

## 2015-05-27 LAB — TSH: TSH: 1.378 u[IU]/mL (ref 0.350–4.500)

## 2015-05-27 NOTE — Patient Instructions (Signed)
Your physician recommends that you return for lab work TODAY.  Your physician has recommended that you wear an event monitor. Event monitors are medical devices that record the heart's electrical activity. Doctors most often [place] these monitors to diagnose arrhythmias. Arrhythmias are problems with the speed or rhythm of the heartbeat. The monitor is a small, portable device. You can wear one while you do your normal daily activities. This is usually used to diagnose what is causing palpitations/syncope (passing out).  Dr Percival Spanish recommends that you schedule a follow-up appointment in 4 months.  Your Doctor has ordered you to wear a heart monitor. You will wear this for 21 days.   TIPS -  REMINDERS 1. The sensor is the lanyard that is worn around your neck every day - this is powered by a battery that needs to be changed every day 2. The monitor is the device that allows you to record symptoms - this will need to be charged daily 3. The sensor & monitor need to be within 100 feet of each other at all times 4. The sensor connects to the electrodes (stickers) - these should be changed every 24-48 hours (you do not have to remove them when you bathe, just make sure they are dry when you connect it back to the sensor 5. If you need more supplies (electrodes, batteries), please call the 1-800 # on the back of the pamphlet and [employer] will mail you more supplies 6. If your skin becomes sensitive, please try the sample pack of sensitive skin electrodes (the white packet in your silver box) and call [employer] to have them mail you more of these type of electrodes 7. When you are finish wearing the monitor, please place all supplies back in the silver box, place the silver box in the pre-packaged UPS bag and drop off at [employer] or call them so they can come pick it up   Cardiac Event Monitoring A cardiac event monitor is a small recording device used to help detect abnormal heart rhythms (arrhythmias). The  monitor is used to record heart rhythm when noticeable symptoms such as the following occur:  Fast heartbeats (palpitations), such as heart racing or fluttering.  Dizziness.  Fainting or light-headedness.  Unexplained weakness. The monitor is wired to two electrodes placed on your chest. Electrodes are flat, sticky disks that attach to your skin. The monitor can be worn for up to 30 days. You will wear the monitor at all times, except when bathing.  HOW TO USE YOUR CARDIAC EVENT MONITOR A technician will prepare your chest for the electrode placement. The technician will show you how to place the electrodes, how to work the monitor, and how to replace the batteries. Take time to practice using the monitor before you leave the office. Make sure you understand how to send the information from the monitor to your health care provider. This requires a telephone with a landline, not a cell phone. You need to:  Wear your monitor at all times, except when you are in water:  Do not get the monitor wet.  Take the monitor off when bathing. Do not swim or use a hot tub with it on.  Keep your skin clean. Do not put body lotion or moisturizer on your chest.  Change the electrodes daily or any time they stop sticking to your skin. You might need to use tape to keep them on.  It is possible that your skin under the electrodes could become irritated. To  keep this from happening, try to put the electrodes in slightly different places on your chest. However, they must remain in the area under your left breast and in the upper right section of your chest.  Make sure the monitor is safely clipped to your clothing or in a location close to your body that your health care provider recommends.  Press the button to record when you feel symptoms of heart trouble, such as dizziness, weakness, light-headedness, palpitations, thumping, shortness of breath, unexplained weakness, or a fluttering or racing heart. The  monitor is always on and records what happened slightly before you pressed the button, so do not worry about being too late to get good information.  Keep a diary of your activities, such as walking, doing chores, and taking medicine. It is especially important to note what you were doing when you pushed the button to record your symptoms. This will help your health care provider determine what might be contributing to your symptoms. The information stored in your monitor will be reviewed by your health care provider alongside your diary entries.  Send the recorded information as recommended by your health care provider. It is important to understand that it will take some time for your health care provider to process the results.  Change the batteries as recommended by your health care provider. SEEK IMMEDIATE MEDICAL CARE IF:   You have chest pain.  You have extreme difficulty breathing or shortness of breath.  You develop a very fast heartbeat that persists.  You develop dizziness that does not go away.  You faint or constantly feel you are about to faint. Document Released: 01/28/2008 Document Revised: 09/04/2013 Document Reviewed: 10/17/2012 [facility] Patient Information 2015 [place], [place]. This information is not intended to replace advice given to you by your health care provider. Make sure you discuss any questions you have with your health care provider.

## 2015-06-03 ENCOUNTER — Telehealth: Payer: Self-pay | Admitting: *Deleted

## 2015-06-03 NOTE — Telephone Encounter (Signed)
Pt states she had itching on the top of her feet and her primary doctor gave her a cream for under her breast, and said it may help her feet, but it hasn't.  The cream was Triamcinolone.  I told pt Dr. Amalia Hailey would like to evaluated her, and transferred her to schedulers.

## 2015-06-05 ENCOUNTER — Other Ambulatory Visit: Payer: Self-pay

## 2015-06-05 DIAGNOSIS — Z1231 Encounter for screening mammogram for malignant neoplasm of breast: Secondary | ICD-10-CM

## 2015-06-26 ENCOUNTER — Encounter: Payer: Self-pay | Admitting: Podiatry

## 2015-06-26 ENCOUNTER — Ambulatory Visit (INDEPENDENT_AMBULATORY_CARE_PROVIDER_SITE_OTHER): Payer: Medicare Other | Admitting: Podiatry

## 2015-06-26 VITALS — BP 146/83 | HR 78 | Resp 12

## 2015-06-26 DIAGNOSIS — B353 Tinea pedis: Secondary | ICD-10-CM

## 2015-06-26 MED ORDER — NAFTIFINE HCL 2 % EX GEL: CUTANEOUS | Status: DC

## 2015-06-26 NOTE — Progress Notes (Signed)
   Subjective:    Patient ID: Emily [name], female    DOB: [birth year], 75 y.o.   MRN: [id number]  HPI    This patient presents today describing approximately 1 month history of itching in or around her ankle areas initially left and then right. The itching is especially more uncomfortable at night. Patient admits scratching these areas to relieve the symptoms. She has found that Vaseline does reduce some of the symptoms. She was given a prescription for triamcinolone cream for this problem, however did not use this cream. Also patient had a prescription for Chlortrimazole cream plus betamethasone cream combination for skin lesion on her breast area which she also did not use. She says the symptoms seem to be dissipating somewhat over time  Patient is a type II diabetic and presents at approximately three-month intervals for debridement of symptomatic mycotic toenails  Review of Systems  Skin: Positive for color change.       Objective:   Physical Exam  Orientated 3  Vascular: DP pulses 2/4 bilaterally PT pulses 2/4 bilaterally Capillary reflex immediate bilaterally  Neurological: Sensation to 10 g monofilament wire intact 3/5 right 4/5 left Vibratory sensation reactive bilaterally Ankle reflexes reactive bilaterally  Dermatological: Toenails 6-10 are hypertrophic deformed with texture and color changes Rashing medial left ankle with some punctate raised areas excoriations right anterior lower leg Some occasional punctate lesions medial lateral borders the right and left feet  Musculoskeletal: Pes planus bilaterally There is no restriction ankle, subtalar, midtarsal joints bilaterally      Assessment & Plan:   Assessment: Diabetic with satisfactory neurovascular status Diabetic without obvious foot complications Tinea pedis  Plan: Today review the results of examination with patient today Rx Naftin 2% cream with instructions to apply once daily 30  days  Reappoint at next scheduled visit for nail debridement or sooner if patient has concern

## 2015-06-26 NOTE — Patient Instructions (Signed)
Today I prescribed an antifungal cream to rub into your skin on your foot and ankle areas once daily 30 days

## 2015-07-01 ENCOUNTER — Encounter: Payer: Self-pay | Admitting: Obstetrics & Gynecology

## 2015-07-01 ENCOUNTER — Ambulatory Visit (INDEPENDENT_AMBULATORY_CARE_PROVIDER_SITE_OTHER): Payer: Medicare Other | Admitting: Obstetrics & Gynecology

## 2015-07-01 ENCOUNTER — Other Ambulatory Visit (HOSPITAL_COMMUNITY)
Admission: RE | Admit: 2015-07-01 | Discharge: 2015-07-01 | Disposition: A | Discharge status: Home / Self Care | Payer: Medicare Other | Source: Ambulatory Visit | Attending: Obstetrics & Gynecology | Admitting: Obstetrics & Gynecology

## 2015-07-01 VITALS — BP 152/69 | HR 78 | Temp 98.3°F | Ht 64.0 in | Wt 120.6 lb

## 2015-07-01 DIAGNOSIS — Z1151 Encounter for screening for human papillomavirus (HPV): Secondary | ICD-10-CM | POA: Insufficient documentation

## 2015-07-01 DIAGNOSIS — Z111 Encounter for screening for respiratory tuberculosis: Secondary | ICD-10-CM | POA: Diagnosis not present

## 2015-07-01 DIAGNOSIS — Z01419 Encounter for gynecological examination (general) (routine) without abnormal findings: Secondary | ICD-10-CM | POA: Diagnosis not present

## 2015-07-01 DIAGNOSIS — Z124 Encounter for screening for malignant neoplasm of cervix: Secondary | ICD-10-CM

## 2015-07-01 DIAGNOSIS — Z01411 Encounter for gynecological examination (general) (routine) with abnormal findings: Secondary | ICD-10-CM | POA: Insufficient documentation

## 2015-07-01 DIAGNOSIS — N951 Menopausal and female climacteric states: Secondary | ICD-10-CM | POA: Diagnosis not present

## 2015-07-01 NOTE — Progress Notes (Signed)
Patient ID: Emily [name], female   DOB: March 23, 1941, 75 y.o.   MRN: [id number] Subjective:     Emily [name] is a 75 y.o. female here for a routine exam.  [id number]. Not sexually active for ~13-14 years.  Current complaints: pt c/o new onset of hot flushes that restarted 1 year prev.    She does not feel that it is incapacitating but wants to make sure she's ok.  Her TSH was normal in Jan 2017   Gynecologic History No LMP recorded. Patient is postmenopausal. Contraception: post menopausal status Last Pap: maybe 10 years. Results were: normal Last mammogram: 2016. Results were: normal  Has an appt for Mar 8th Obstetric History OB History  No data available     The following portions of the patient's history were reviewed and updated as appropriate: allergies, current medications, past family history, past medical history, past surgical history and problem list.  Review of Systems Pertinent items are noted in HPI.    Objective:    BP 152/69 mmHg  Pulse 78  Temp(Src) 98.3 F (36.8 C) (Oral)  Ht 5\' 4"  (1.626 m)  Wt 120 lb 9.6 oz (54.704 kg)  BMI 20.69 kg/m2 General Appearance:    Alert, cooperative, no distress, appears stated age  Head:    Normocephalic, without obvious abnormality, atraumatic  Eyes:    conjunctiva/corneas clear, EOM's intact, both eyes  Ears:    Normal external ear canals, both ears  Nose:   Nares normal, septum midline, mucosa normal, no drainage    or sinus tenderness  Throat:   Lips, mucosa, and tongue normal; teeth and gums normal  Neck:   Supple, symmetrical, trachea midline, no adenopathy;    thyroid:  no enlargement/tenderness/nodules  Back:     Symmetric, no curvature, ROM normal, no CVA tenderness  Lungs:     Clear to auscultation bilaterally, respirations unlabored  Chest Wall:    No tenderness or deformity   Heart:    Regular rate and rhythm, S1 and S2 normal, no murmur, rub   or gallop  Breast Exam:    No tenderness, masses, or nipple abnormality   Abdomen:     Soft, non-tender, bowel sounds active all four quadrants,    no masses, no organomegaly; well healed vertical incision  Genitalia:    Normal female without lesion, discharge or tenderness; uterus small and mobile     Extremities:   Extremities normal, atraumatic, no cyanosis or edema  Pulses:   2+ and symmetric all extremities  Skin:   Skin color, texture, turgor normal, no rashes or lesions     Assessment:    Healthy female exam.   Vasomotor flushing- normal TSH.  Rec TB test.  If TB test normal suspect  Just related to menopause and pt does not want further treatment    Plan:    Follow up in: 1 year.    Has mammogram scheduled early March Rec TB test today.  Pt to f/u in 48-72 hours to have test read.  Emily [name], M.D., Emily [name]

## 2015-07-01 NOTE — Addendum Note (Signed)
Addended by: Michel Harrow on: 07/01/2015 03:49 PM   Modules accepted: Orders

## 2015-07-01 NOTE — Progress Notes (Signed)
Pt reports scheduled mammogram on 07/10/15.

## 2015-07-02 LAB — CYTOLOGY - PAP

## 2015-07-10 ENCOUNTER — Telehealth: Payer: Self-pay | Admitting: *Deleted

## 2015-07-10 ENCOUNTER — Ambulatory Visit
Admission: RE | Admit: 2015-07-10 | Discharge: 2015-07-10 | Disposition: A | Discharge status: Home / Self Care | Payer: Medicare Other | Source: Ambulatory Visit

## 2015-07-10 DIAGNOSIS — Z1231 Encounter for screening mammogram for malignant neoplasm of breast: Secondary | ICD-10-CM

## 2015-07-10 NOTE — Telephone Encounter (Signed)
Ok to refill 

## 2015-07-10 NOTE — Telephone Encounter (Signed)
Dr Silverio Decamp. Patient is requesting refill of Lomotil sent to [facility] Can she have this refill??

## 2015-07-11 NOTE — Telephone Encounter (Signed)
Ok to refill, called [facility] to refill per Dr Silverio Decamp
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
## 2015-07-18 ENCOUNTER — Encounter: Payer: Self-pay | Admitting: Cardiology

## 2015-07-18 ENCOUNTER — Ambulatory Visit (INDEPENDENT_AMBULATORY_CARE_PROVIDER_SITE_OTHER): Payer: Medicare Other | Admitting: Cardiology

## 2015-07-18 VITALS — BP 124/62 | HR 76 | Ht 62.0 in | Wt 124.5 lb

## 2015-07-18 DIAGNOSIS — R079 Chest pain, unspecified: Secondary | ICD-10-CM | POA: Diagnosis not present

## 2015-07-18 NOTE — Patient Instructions (Signed)
Your physician recommends that you schedule a follow-up appointment in: As Needed  Your physician has requested that you have an exercise tolerance test with Modified Bruce Protocol. For further information please visit [email or web address]. Please also follow instruction sheet, as given.

## 2015-07-23 ENCOUNTER — Emergency Department (HOSPITAL_COMMUNITY)
Admission: EM | Admit: 2015-07-23 | Discharge: 2015-07-23 | Disposition: A | Discharge status: Home / Self Care | Payer: Medicare Other | Attending: Emergency Medicine | Admitting: Emergency Medicine

## 2015-07-23 ENCOUNTER — Emergency Department (HOSPITAL_COMMUNITY): Payer: Medicare Other

## 2015-07-23 ENCOUNTER — Encounter (HOSPITAL_COMMUNITY): Payer: Self-pay | Admitting: Emergency Medicine

## 2015-07-23 DIAGNOSIS — F17210 Nicotine dependence, cigarettes, uncomplicated: Secondary | ICD-10-CM | POA: Insufficient documentation

## 2015-07-23 DIAGNOSIS — Z8669 Personal history of other diseases of the nervous system and sense organs: Secondary | ICD-10-CM | POA: Insufficient documentation

## 2015-07-23 DIAGNOSIS — K2270 Barrett's esophagus without dysplasia: Secondary | ICD-10-CM | POA: Diagnosis not present

## 2015-07-23 DIAGNOSIS — R197 Diarrhea, unspecified: Secondary | ICD-10-CM

## 2015-07-23 DIAGNOSIS — Z79899 Other long term (current) drug therapy: Secondary | ICD-10-CM | POA: Diagnosis not present

## 2015-07-23 DIAGNOSIS — K580 Irritable bowel syndrome with diarrhea: Secondary | ICD-10-CM | POA: Insufficient documentation

## 2015-07-23 DIAGNOSIS — F329 Major depressive disorder, single episode, unspecified: Secondary | ICD-10-CM | POA: Diagnosis not present

## 2015-07-23 DIAGNOSIS — K589 Irritable bowel syndrome without diarrhea: Secondary | ICD-10-CM

## 2015-07-23 DIAGNOSIS — E119 Type 2 diabetes mellitus without complications: Secondary | ICD-10-CM | POA: Diagnosis not present

## 2015-07-23 DIAGNOSIS — Z7982 Long term (current) use of aspirin: Secondary | ICD-10-CM | POA: Insufficient documentation

## 2015-07-23 DIAGNOSIS — Z87828 Personal history of other (healed) physical injury and trauma: Secondary | ICD-10-CM | POA: Diagnosis not present

## 2015-07-23 DIAGNOSIS — M1990 Unspecified osteoarthritis, unspecified site: Secondary | ICD-10-CM | POA: Diagnosis not present

## 2015-07-23 DIAGNOSIS — R1084 Generalized abdominal pain: Secondary | ICD-10-CM

## 2015-07-23 DIAGNOSIS — I10 Essential (primary) hypertension: Secondary | ICD-10-CM | POA: Diagnosis not present

## 2015-07-23 DIAGNOSIS — K219 Gastro-esophageal reflux disease without esophagitis: Secondary | ICD-10-CM | POA: Diagnosis not present

## 2015-07-23 LAB — COMPREHENSIVE METABOLIC PANEL
ALT: 53 U/L (ref 14–54)
AST: 48 U/L — ABNORMAL HIGH (ref 15–41)
Albumin: 4.1 g/dL (ref 3.5–5.0)
Alkaline Phosphatase: 78 U/L (ref 38–126)
Anion gap: 7 (ref 5–15)
BUN: 11 mg/dL (ref 6–20)
CO2: 24 mmol/L (ref 22–32)
Calcium: 8.8 mg/dL — ABNORMAL LOW (ref 8.9–10.3)
Chloride: 110 mmol/L (ref 101–111)
Creatinine, Ser: 0.61 mg/dL (ref 0.44–1.00)
GFR calc Af Amer: >60 mL/min (ref >60)
GFR calc non Af Amer: >60 mL/min (ref >60)
Glucose, Bld: 99 mg/dL (ref 65–99)
Potassium: 3.8 mmol/L (ref 3.5–5.1)
Sodium: 141 mmol/L (ref 135–145)
Total Bilirubin: 0.4 mg/dL (ref 0.3–1.2)
Total Protein: 7 g/dL (ref 6.5–8.1)

## 2015-07-23 LAB — URINALYSIS, ROUTINE W REFLEX MICROSCOPIC
Bilirubin Urine: NEGATIVE
Glucose, UA: NEGATIVE mg/dL
Hgb urine dipstick: NEGATIVE
Ketones, ur: NEGATIVE mg/dL
Leukocytes, UA: NEGATIVE
Nitrite: NEGATIVE
Protein, ur: NEGATIVE mg/dL
Specific Gravity, Urine: 1.017 (ref 1.005–1.030)
pH: 6 (ref 5.0–8.0)

## 2015-07-23 LAB — I-STAT TROPONIN, ED: Troponin i, poc: 0 ng/mL (ref 0.00–0.08)

## 2015-07-23 LAB — CBC
HCT: 38.5 % (ref 36.0–46.0)
Hemoglobin: 12.1 g/dL (ref 12.0–15.0)
MCH: 24.2 pg — ABNORMAL LOW (ref 26.0–34.0)
MCHC: 31.4 g/dL (ref 30.0–36.0)
MCV: 77.2 fL — ABNORMAL LOW (ref 78.0–100.0)
Platelets: 219 10*3/uL (ref 150–400)
RBC: 4.99 MIL/uL (ref 3.87–5.11)
RDW: 14.9 % (ref 11.5–15.5)
WBC: 7.5 10*3/uL (ref 4.0–10.5)

## 2015-07-23 LAB — LIPASE, BLOOD: Lipase: 36 U/L (ref 11–51)

## 2015-07-23 MED ORDER — IOHEXOL 300 MG/ML  SOLN: 100.0000 mL | Freq: Once | INTRAMUSCULAR | Status: DC | PRN

## 2015-07-23 MED ORDER — PROMETHAZINE HCL 25 MG PO TABS
12.5000 mg | ORAL_TABLET | Freq: Once | ORAL | Status: AC
  Administered 2015-07-23: 12.5 mg via ORAL
  Filled 2015-07-23: qty 1

## 2015-07-23 MED ORDER — HYDROCODONE-ACETAMINOPHEN 5-325 MG PO TABS
1.0000 | ORAL_TABLET | Freq: Once | ORAL | Status: AC
  Administered 2015-07-23: 1 via ORAL
  Filled 2015-07-23: qty 1

## 2015-07-23 MED ORDER — IOHEXOL 300 MG/ML  SOLN
25.0000 mL | Freq: Once | INTRAMUSCULAR | Status: AC | PRN
  Administered 2015-07-23: 25 mL via ORAL

## 2015-07-23 MED ORDER — SODIUM CHLORIDE 0.9 % IV BOLUS (SEPSIS)
1000.0000 mL | Freq: Once | INTRAVENOUS | Status: AC
  Administered 2015-07-23: 1000 mL via INTRAVENOUS

## 2015-07-23 MED ORDER — PROMETHAZINE HCL 25 MG/ML IJ SOLN
12.5000 mg | Freq: Once | INTRAMUSCULAR | Status: DC
  Filled 2015-07-23: qty 1

## 2015-07-23 MED ORDER — IOPAMIDOL (ISOVUE-300) INJECTION 61%
100.0000 mL | Freq: Once | INTRAVENOUS | Status: AC | PRN
  Administered 2015-07-23: 100 mL via INTRAVENOUS

## 2015-07-23 MED ORDER — PROMETHAZINE HCL 25 MG/ML IJ SOLN: 25.0000 mg | Freq: Once | INTRAMUSCULAR | Status: DC

## 2015-07-23 MED ORDER — ONDANSETRON HCL 4 MG/2ML IJ SOLN
4.0000 mg | Freq: Once | INTRAMUSCULAR | Status: AC
  Administered 2015-07-23: 4 mg via INTRAVENOUS
  Filled 2015-07-23: qty 2

## 2015-07-23 MED ORDER — MORPHINE SULFATE (PF) 4 MG/ML IV SOLN
4.0000 mg | Freq: Once | INTRAVENOUS | Status: DC
  Filled 2015-07-23: qty 1

## 2015-07-23 NOTE — ED Notes (Addendum)
Patient states she has had abdominal pain, diarrhea, and nausea for one month. Patient states she has been diagnosed with IBS. Patient states she may be constipated but reports her last BM was this morning. Denies emesis.

## 2015-07-23 NOTE — Discharge Instructions (Signed)
Your CT scan does not show serious cause of your abdominal pain today. Your pain may be related to your irritable bowel syndrome. Please call your gastroenterologist to set up follow-up for further workup and management within the next 1-2 weeks. Return for worsening symptoms including fevers, worsening pain, vomiting unable to keep down food or fluids, or any other symptoms concerning to you.  Diarrhea Diarrhea is frequent loose and watery bowel movements. It can cause you to feel weak and dehydrated. Dehydration can cause you to become tired and thirsty, have a dry mouth, and have decreased urination that often is dark yellow. Diarrhea is a sign of another problem, most often an infection that will not last long. In most cases, diarrhea typically lasts 2-3 days. However, it can last longer if it is a sign of something more serious. It is important to treat your diarrhea as directed by your caregiver to lessen or prevent future episodes of diarrhea. CAUSES  Some common causes include:  Gastrointestinal infections caused by viruses, bacteria, or parasites.  Food poisoning or food allergies.  Certain medicines, such as antibiotics, chemotherapy, and laxatives.  Artificial sweeteners and fructose.  Digestive disorders. HOME CARE INSTRUCTIONS  Ensure adequate fluid intake (hydration): Have 1 cup (8 oz) of fluid for each diarrhea episode. Avoid fluids that contain simple sugars or sports drinks, fruit juices, whole milk products, and sodas. Your urine should be clear or pale yellow if you are drinking enough fluids. Hydrate with an oral rehydration solution that you can purchase at pharmacies, retail stores, and online. You can prepare an oral rehydration solution at home by mixing the following ingredients together:   - tsp table salt.   tsp baking soda.   tsp salt substitute containing potassium chloride.  1  tablespoons sugar.  1 L (34 oz) of water.  Certain foods and beverages may  increase the speed at which food moves through the gastrointestinal (GI) tract. These foods and beverages should be avoided and include:  Caffeinated and alcoholic beverages.  High-fiber foods, such as raw fruits and vegetables, nuts, seeds, and whole grain breads and cereals.  Foods and beverages sweetened with sugar alcohols, such as xylitol, sorbitol, and mannitol.  Some foods may be well tolerated and may help thicken stool including:  Starchy foods, such as rice, toast, pasta, low-sugar cereal, oatmeal, grits, baked potatoes, crackers, and bagels.  Bananas.  Applesauce.  Add probiotic-rich foods to help increase healthy bacteria in the GI tract, such as yogurt and fermented milk products.  Wash your hands well after each diarrhea episode.  Only take over-the-counter or prescription medicines as directed by your caregiver.  Take a warm bath to relieve any burning or pain from frequent diarrhea episodes. SEEK IMMEDIATE MEDICAL CARE IF:   You are unable to keep fluids down.  You have persistent vomiting.  You have blood in your stool, or your stools are black and tarry.  You do not urinate in 6-8 hours, or there is only a small amount of very dark urine.  You have abdominal pain that increases or localizes.  You have weakness, dizziness, confusion, or light-headedness.  You have a severe headache.  Your diarrhea gets worse or does not get better.  You have a fever or persistent symptoms for more than 2-3 days.  You have a fever and your symptoms suddenly get worse. MAKE SURE YOU:   Understand these instructions.  Will watch your condition.  Will get help right away if you are not doing  well or get worse.   This information is not intended to replace advice given to you by your health care provider. Make sure you discuss any questions you have with your health care provider.   Document Released: 04/10/2002 Document Revised: 05/11/2014 Document Reviewed:  12/27/2011 Elsevier Interactive Patient Education 2016 [employer]  Abdominal Pain, Adult Many things can cause abdominal pain. Usually, abdominal pain is not caused by a disease and will improve without treatment. It can often be observed and treated at home. Your health care provider will do a physical exam and possibly order blood tests and X-rays to help determine the seriousness of your pain. However, in many cases, more time must pass before a clear cause of the pain can be found. Before that point, your health care provider may not know if you need more testing or further treatment. HOME CARE INSTRUCTIONS Monitor your abdominal pain for any changes. The following actions may help to alleviate any discomfort you are experiencing:  Only take over-the-counter or prescription medicines as directed by your health care provider.  Do not take laxatives unless directed to do so by your health care provider.  Try a clear liquid diet (broth, tea, or water) as directed by your health care provider. Slowly move to a bland diet as tolerated. SEEK MEDICAL CARE IF:  You have unexplained abdominal pain.  You have abdominal pain associated with nausea or diarrhea.  You have pain when you urinate or have a bowel movement.  You experience abdominal pain that wakes you in the night.  You have abdominal pain that is worsened or improved by eating food.  You have abdominal pain that is worsened with eating fatty foods.  You have a fever. SEEK IMMEDIATE MEDICAL CARE IF:  Your pain does not go away within 2 hours.  You keep throwing up (vomiting).  Your pain is felt only in portions of the abdomen, such as the right side or the left lower portion of the abdomen.  You pass bloody or black tarry stools. MAKE SURE YOU:  Understand these instructions.  Will watch your condition.  Will get help right away if you are not doing well or get worse.   This information is not intended to replace  advice given to you by your health care provider. Make sure you discuss any questions you have with your health care provider.   Document Released: 01/28/2005 Document Revised: 01/09/2015 Document Reviewed: 12/28/2012 Elsevier Interactive Patient Education [employer].

## 2015-07-23 NOTE — ED Notes (Signed)
This Probation officer started a 22g IV in patient's right hand, IV flushed without complication, no signs of infiltration. Patient was c/o burning to IV site with medication adminstration. IV removed at patient's request, catheter intact.

## 2015-07-23 NOTE — ED Notes (Signed)
Pt assisted to BR.
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
## 2015-07-24 ENCOUNTER — Telehealth: Payer: Self-pay | Admitting: Gastroenterology

## 2015-07-24 ENCOUNTER — Other Ambulatory Visit: Payer: Self-pay

## 2015-07-24 ENCOUNTER — Telehealth: Payer: Self-pay | Admitting: Obstetrics & Gynecology

## 2015-07-24 NOTE — Telephone Encounter (Signed)
Emily [name] has been to the ER for abdominal pain. No cause for her abdominal pain was identified. She reports diarrhea on a daily basis now. She will have abdominal pain, have her bowel movement and then feel a little better. No blood with the BM. Bentyl helps a little but it wears off. Patient adamantly states she is due a colonoscopy and the ER doctor told her she needs to get one. Please advise on how you want her scheduled. She is in for a recall EGD this fall for surveillance of Barrett's. Her colonoscoy is scheduled for recall 2019.

## 2015-07-24 NOTE — Telephone Encounter (Signed)
Attempted to call patient but no answer will try back later

## 2015-07-24 NOTE — Telephone Encounter (Signed)
Patient called questioning why she have an upcoming appointment with [place] and what for

## 2015-07-25 NOTE — Telephone Encounter (Signed)
Spoke with patient she is aware that she has an upcoming appt to take a closer look at her cervix.

## 2015-07-29 NOTE — Telephone Encounter (Signed)
Reviewed her chart and recent CT abd & pelvis. No acute abnormality. Abdominal pain likely functional or related to diverticulosis. Please advise patient to start benefiber daily. Schedule for follow up visit ( please check if any available with advanced practioner sooner) Thanks

## 2015-07-30 NOTE — Telephone Encounter (Signed)
Patient will try Benefiber. She start with a tbsp daily for a week and advance to recommended dosage. Appointment made.

## 2015-08-05 ENCOUNTER — Ambulatory Visit (INDEPENDENT_AMBULATORY_CARE_PROVIDER_SITE_OTHER): Payer: Medicare Other | Admitting: Obstetrics & Gynecology

## 2015-08-05 ENCOUNTER — Encounter: Payer: Self-pay | Admitting: Obstetrics & Gynecology

## 2015-08-05 ENCOUNTER — Telehealth: Payer: Self-pay | Admitting: Gastroenterology

## 2015-08-05 ENCOUNTER — Other Ambulatory Visit (HOSPITAL_COMMUNITY)
Admission: RE | Admit: 2015-08-05 | Discharge: 2015-08-05 | Disposition: A | Discharge status: Home / Self Care | Payer: Medicare Other | Source: Ambulatory Visit | Attending: Obstetrics & Gynecology | Admitting: Obstetrics & Gynecology

## 2015-08-05 VITALS — BP 161/87 | HR 75 | Temp 98.2°F | Ht 62.0 in | Wt 126.4 lb

## 2015-08-05 DIAGNOSIS — R896 Abnormal cytological findings in specimens from other organs, systems and tissues: Secondary | ICD-10-CM | POA: Diagnosis not present

## 2015-08-05 DIAGNOSIS — IMO0002 Reserved for concepts with insufficient information to code with codable children: Secondary | ICD-10-CM

## 2015-08-05 DIAGNOSIS — N871 Moderate cervical dysplasia: Secondary | ICD-10-CM | POA: Diagnosis not present

## 2015-08-05 DIAGNOSIS — R87612 Low grade squamous intraepithelial lesion on cytologic smear of cervix (LGSIL): Secondary | ICD-10-CM | POA: Diagnosis present

## 2015-08-05 NOTE — Patient Instructions (Signed)
Colposcopy, Care After Refer to this sheet in the next few weeks. These instructions provide you with information on caring for yourself after your procedure. Your health care provider may also give you more specific instructions. Your treatment has been planned according to current medical practices, but problems sometimes occur. Call your health care provider if you have any problems or questions after your procedure. WHAT TO EXPECT AFTER THE PROCEDURE  After your procedure, it is typical to have the following:  Cramping. This often goes away in a few minutes.  Soreness. This may last for 2 days.  Lightheadedness. Lie down for a few minutes if this occurs. You may also have some bleeding or dark discharge for a few days. You may need to wear a sanitary pad during this time. HOME CARE INSTRUCTIONS  Avoid sex, douching, and using tampons for 3 days or as directed by your health care provider.  Only take over-the-counter or prescription medicines as directed by your health care provider. Do not take aspirin because it can cause bleeding.  Continue to take birth control pills if you are on them.  Not all test results are available during your visit. If your test results are not back during the visit, make an appointment with your health care provider to find out the results. Do not assume everything is normal if you have not heard from your health care provider or the medical facility. It is important for you to follow up on all of your test results.  Follow your health care provider's advice regarding activity, follow-up visits, and follow-up Pap tests. SEEK MEDICAL CARE IF:  You develop a rash.  You have problems with your medicine. SEEK IMMEDIATE MEDICAL CARE IF:  You are bleeding heavily or are passing blood clots.  You have a fever.  You have abnormal vaginal discharge.  You are having cramps that do not go away after taking your pain medicine.  You feel lightheaded, dizzy, or  faint.  You have stomach pain.   This information is not intended to replace advice given to you by your health care provider. Make sure you discuss any questions you have with your health care provider.   Document Released: 02/08/2013 Document Reviewed: 02/08/2013 Elsevier Interactive Patient Education 2016 [employer]  

## 2015-08-05 NOTE — Telephone Encounter (Signed)
Patient reports that she has 2 days of loose stools and reports that she feels it is related to benefiber.  She is advised to hold benefiber for a few days.  She will try resuming benefiber after a few days. She will call back if she has diarrhea after resuming diarrhea.

## 2015-08-05 NOTE — Progress Notes (Signed)
Patient ID: Emberleigh Thapa, female   DOB: 1940/10/30, 75 y.o.   MRN: [id number] Pt is 75 years old but, reported a h/o abnormal PAP at her annual that was never follwed up on.  She presents today for colpo. Patient given informed consent, signed copy in the chart, time out was performed.  Placed in lithotomy position. Cervix viewed with speculum and colposcope after application of acetic acid.  07/01/2015 LOW GRADE SQUAMOUS INTRAEPITHELIAL LESION: CIN-1/ HPV (LSIL).  Colposcopy adequate?  yes Acetowhite lesions?no Punctation?no Mosaicism?  no Abnormal vasculature?  no Biopsies?no ECC?yes  Patient was given post procedure instructions.  She will be call with results.  Leodis Alcocer L. Harraway-Smith, M.D., Cherlynn June

## 2015-08-07 ENCOUNTER — Encounter: Payer: Self-pay | Admitting: Podiatry

## 2015-08-07 ENCOUNTER — Ambulatory Visit (INDEPENDENT_AMBULATORY_CARE_PROVIDER_SITE_OTHER): Payer: Medicare Other | Admitting: Podiatry

## 2015-08-07 DIAGNOSIS — M79676 Pain in unspecified toe(s): Secondary | ICD-10-CM

## 2015-08-07 DIAGNOSIS — B351 Tinea unguium: Secondary | ICD-10-CM | POA: Diagnosis not present

## 2015-08-07 NOTE — Patient Instructions (Signed)
Diabetes and Foot Care Diabetes may cause you to have problems because of poor blood supply (circulation) to your feet and legs. This may cause the skin on your feet to become thinner, break easier, and heal more slowly. Your skin may become dry, and the skin may peel and crack. You may also have nerve damage in your legs and feet causing decreased feeling in them. You may not notice minor injuries to your feet that could lead to infections or more serious problems. Taking care of your feet is one of the most important things you can do for yourself.  HOME CARE INSTRUCTIONS  Wear shoes at all times, even in the house. Do not go barefoot. Bare feet are easily injured.  Check your feet daily for blisters, cuts, and redness. If you cannot see the bottom of your feet, use a mirror or ask someone for help.  Wash your feet with warm water (do not use hot water) and mild soap. Then pat your feet and the areas between your toes until they are completely dry. Do not soak your feet as this can dry your skin.  Apply a moisturizing lotion or petroleum jelly (that does not contain alcohol and is unscented) to the skin on your feet and to dry, brittle toenails. Do not apply lotion between your toes.  Trim your toenails straight across. Do not dig under them or around the cuticle. File the edges of your nails with an emery board or nail file.  Do not cut corns or calluses or try to remove them with medicine.  Wear clean socks or stockings every day. Make sure they are not too tight. Do not wear knee-high stockings since they may decrease blood flow to your legs.  Wear shoes that fit properly and have enough cushioning. To break in new shoes, wear them for just a few hours a day. This prevents you from injuring your feet. Always look in your shoes before you put them on to be sure there are no objects inside.  Do not cross your legs. This may decrease the blood flow to your feet.  If you find a minor scrape,  cut, or break in the skin on your feet, keep it and the skin around it clean and dry. These areas may be cleansed with mild soap and water. Do not cleanse the area with peroxide, alcohol, or iodine.  When you remove an adhesive bandage, be sure not to damage the skin around it.  If you have a wound, look at it several times a day to make sure it is healing.  Do not use heating pads or hot water bottles. They may burn your skin. If you have lost feeling in your feet or legs, you may not know it is happening until it is too late.  Make sure your health care provider performs a complete foot exam at least annually or more often if you have foot problems. Report any cuts, sores, or bruises to your health care provider immediately. SEEK MEDICAL CARE IF:   You have an injury that is not healing.  You have cuts or breaks in the skin.  You have an ingrown nail.  You notice redness on your legs or feet.  You feel burning or tingling in your legs or feet.  You have pain or cramps in your legs and feet.  Your legs or feet are numb.  Your feet always feel cold. SEEK IMMEDIATE MEDICAL CARE IF:   There is increasing redness,   swelling, or pain in or around a wound.  There is a red line that goes up your leg.  Pus is coming from a wound.  You develop a fever or as directed by your health care provider.  You notice a bad smell coming from an ulcer or wound.   This information is not intended to replace advice given to you by your health care provider. Make sure you discuss any questions you have with your health care provider.   Document Released: 04/17/2000 Document Revised: 12/21/2012 Document Reviewed: 09/27/2012 Elsevier Interactive Patient Education 2016 [employer]  

## 2015-08-08 ENCOUNTER — Ambulatory Visit (HOSPITAL_COMMUNITY)
Admission: RE | Admit: 2015-08-08 | Discharge: 2015-08-08 | Disposition: A | Discharge status: Home / Self Care | Payer: Medicare Other | Source: Ambulatory Visit | Attending: Cardiovascular Disease | Admitting: Cardiovascular Disease

## 2015-08-08 DIAGNOSIS — R079 Chest pain, unspecified: Secondary | ICD-10-CM

## 2015-08-08 NOTE — Progress Notes (Signed)
Patient ID: Emily [name], female   DOB: 1940/05/25, 75 y.o.   MRN: [id number]   Subjective: This patient presents today complaining of thickened and elongated toenails which or cough walking wearing shoes and requests toenail debridement patient states that all itching has resolved from use of Naftin cream prescribed on the visit of 06/26/2015  Objective: Orientated 3 DP and PT pulses 2/4 bilaterally Capillary reflex immediate bilaterally Sensation to 10 g monofilament wire intact 3/5 right 4/5 left Vibratory sensation reactive bilaterally Ankle reflex equal and reactive bilaterally No open skin lesions bilaterally The toenails are elongated, hypertrophic, deformed, discolored and tender to direct palpation 6-10 Pes planus bilaterally  Assessment: Diabetic with satisfactory neurovascular status Symptomatic onychomycoses 6-10  Plan: Debridement toenails 6-10 mechanically electrically without any bleeding  Reappoint 3 months

## 2015-08-09 LAB — EXERCISE TOLERANCE TEST
Estimated workload: 1.6 METS
Exercise duration (min): 1 min
Exercise duration (sec): 28 s
MPHR: 145 {beats}/min
Peak HR: 108 {beats}/min
Percent HR: 74 %
RPE: 17
Rest HR: 72 {beats}/min

## 2015-08-19 ENCOUNTER — Telehealth: Payer: Self-pay

## 2015-08-19 NOTE — Telephone Encounter (Addendum)
Per Harraway-Smith, pt needs a LEEP.  Called pt and informed her that the provider has recommended a LEEP to take out abnormal cells on her cervix.  Explained to the pt what the procedure is and its process.  I also informed pt that I would send her info to the front office and that the front office will call with an appt.  Pt stated understanding with no further questions.

## 2015-08-21 ENCOUNTER — Ambulatory Visit (INDEPENDENT_AMBULATORY_CARE_PROVIDER_SITE_OTHER): Payer: Medicare Other | Admitting: Gastroenterology

## 2015-08-21 ENCOUNTER — Encounter: Payer: Self-pay | Admitting: Gastroenterology

## 2015-08-21 VITALS — BP 130/60 | HR 72 | Ht 61.0 in | Wt 126.0 lb

## 2015-08-21 DIAGNOSIS — K589 Irritable bowel syndrome without diarrhea: Secondary | ICD-10-CM | POA: Diagnosis not present

## 2015-08-21 DIAGNOSIS — R197 Diarrhea, unspecified: Secondary | ICD-10-CM | POA: Diagnosis not present

## 2015-08-21 MED ORDER — ELUXADOLINE 75 MG PO TABS: 75.0000 mg | ORAL_TABLET | Freq: Two times a day (BID) | ORAL | Status: DC

## 2015-08-21 NOTE — Patient Instructions (Signed)
Low FodMap Diet Viberzi 75mg  will be sent to your pharmacy Recall colonoscopy is 2019
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
## 2015-08-27 ENCOUNTER — Telehealth: Payer: Self-pay | Admitting: Gastroenterology

## 2015-08-27 NOTE — Telephone Encounter (Signed)
Dr Rhona Raider not covered please advise

## 2015-08-29 NOTE — Telephone Encounter (Signed)
Viberzi is the new medication that could potentially help with her symptoms. She can take FD guard but may be not covered by insurance as well. Ok to take Imodium as needed

## 2015-08-29 NOTE — Telephone Encounter (Signed)
Called patient to inform. Left samples at front desk for patient to try FD guard before she purchases the medication to make sure they will help her. She will have daughter pick up Friday 4/28

## 2015-09-24 ENCOUNTER — Encounter: Payer: Self-pay | Admitting: Cardiology

## 2015-09-24 ENCOUNTER — Ambulatory Visit (INDEPENDENT_AMBULATORY_CARE_PROVIDER_SITE_OTHER): Payer: Medicare Other | Admitting: Cardiology

## 2015-09-24 VITALS — BP 148/73 | HR 71 | Ht 61.0 in | Wt 127.0 lb

## 2015-09-24 DIAGNOSIS — R079 Chest pain, unspecified: Secondary | ICD-10-CM | POA: Diagnosis not present

## 2015-09-24 MED ORDER — PROPRANOLOL HCL 10 MG PO TABS: 10.0000 mg | ORAL_TABLET | Freq: Every day | ORAL | Status: DC

## 2015-09-24 NOTE — Patient Instructions (Signed)
Medication Instructions:  START Propranolol 10 mg daily at bedtime  Labwork: NONE  Testing/Procedures: Your physician has requested that you have an echocardiogram. Echocardiography is a painless test that uses sound waves to create images of your heart. It provides your doctor with information about the size and shape of your heart and how well your heart's chambers and valves are working. This procedure takes approximately one hour. There are no restrictions for this procedure.  Follow-Up: As Needed  Any Other Special Instructions Will Be Listed Below (If Applicable).  If you need a refill on your cardiac medications before your next appointment, please call your pharmacy.
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
## 2015-09-25 ENCOUNTER — Ambulatory Visit (INDEPENDENT_AMBULATORY_CARE_PROVIDER_SITE_OTHER): Payer: Medicare Other | Admitting: Obstetrics & Gynecology

## 2015-09-25 ENCOUNTER — Encounter: Payer: Self-pay | Admitting: Obstetrics & Gynecology

## 2015-09-25 ENCOUNTER — Other Ambulatory Visit (HOSPITAL_COMMUNITY)
Admission: RE | Admit: 2015-09-25 | Discharge: 2015-09-25 | Disposition: A | Discharge status: Home / Self Care | Payer: Medicare Other | Source: Ambulatory Visit | Attending: Obstetrics & Gynecology | Admitting: Obstetrics & Gynecology

## 2015-09-25 VITALS — BP 147/65 | HR 72

## 2015-09-25 DIAGNOSIS — D069 Carcinoma in situ of cervix, unspecified: Secondary | ICD-10-CM | POA: Insufficient documentation

## 2015-09-25 DIAGNOSIS — R232 Flushing: Secondary | ICD-10-CM

## 2015-09-25 DIAGNOSIS — N870 Mild cervical dysplasia: Secondary | ICD-10-CM | POA: Diagnosis not present

## 2015-09-25 MED ORDER — ESCITALOPRAM OXALATE 10 MG PO TABS: 10.0000 mg | ORAL_TABLET | Freq: Every day | ORAL | Status: DC

## 2015-09-25 NOTE — Progress Notes (Signed)
Patient ID: Emily [name], female   DOB: [birth year], 75 y.o.   MRN: [id number] Pap smear and colposcopy reviewed.  Pt also c/o increased hot flushes that she now wants meds for. Her TSH was normal in Jan 2017.  Pap 07/01/2015 Adequacy Reason Satisfactory for evaluation, endocervical/transformation zone component PRESENT. Diagnosis LOW GRADE SQUAMOUS INTRAEPITHELIAL LESION: CIN-1/ HPV (LSIL). Colpo Biopsy 08/05/2015 Diagnosis Endocervix, curettage SMALL FRAGMENT OF SQUAMOUS MUCOSA WITH CIN-II (MODERATE SQUAMOUS DYSPLASIA; HIGH GRADE SQUAMOUS INTRAEPITHELIAL LESION).  Risks, benefits, alternatives, and limitations of procedure explained to patient, including pain, bleeding, infection, failure to remove abnormal tissue and failure to cure dysplasia, need for repeat procedures, damage to pelvic organs, cervical incompetence.  Role of HPV,cervical dysplasia and need for close followup was empasized. Informed written consent was obtained. All questions were answered. Time out performed.  ??Procedure: The patient was placed in lithotomy position and the bivalved coated speculum was placed in the patient's vagina. A grounding pad placed on the patient. Local anesthesia was administered via an intracervical block using 10cc of 2% Lidocaine with epinephrine. The suction was turned on and the Medium 1X Fisher Cone Biopsy Excisor and a small square loop on 80 Watts of cutting current was used to excise the area of decreased uptake and excise the entire transformation zone.the cervix was very shallow.  Excellent hemostasis was achieved using roller ball coagulation set at 80 Watts coagulation current. Monsel's solution was then applied and excellent hemostasis was noted.  The speculum was removed from the vagina. Specimens were sent to pathology. ?The patient tolerated the procedure well. Post-operative instructions given to patient, including instruction to seek medical attention for persistent bright red bleeding,  fever, abdominal/pelvic pain, dysuria, nausea or vomiting. She was also told about the possibility of having copious yellow to black tinged discharge. She was counseled to avoid anything in the vagina (sex/douching/tampons) for 4 weeks. She has a 4 week post-operative check to review results and assess wound healing. Follow up in 6 months for repeat pap or sooner as needed.  Lexapro 10mg  po q day for hot flushes. Will address effectiveness at next visit and increase dosage if needed.  El Pile L. Ihor Dow, M.D., [place] .

## 2015-09-25 NOTE — Addendum Note (Signed)
Addended by: Lavonia Drafts on: 09/25/2015 04:34 PM   Modules accepted: Orders

## 2015-09-25 NOTE — Patient Instructions (Signed)
Loop Electrosurgical Excision Procedure, Care After Refer to this sheet in the next few weeks. These instructions provide you with information on caring for yourself after your procedure. Your caregiver may also give you more specific instructions. Your treatment has been planned according to current medical practices, but problems sometimes occur. Call your caregiver if you have any problems or questions after your procedure. HOME CARE INSTRUCTIONS   Do not use tampons, douche, or have sexual intercourse for 2 weeks or as directed by your caregiver.  Begin normal activities if you have no or minimal cramping or bleeding, unless directed otherwise by your caregiver.  Take your temperature if you feel sick. Write down your temperature on paper, and tell your caregiver if you have a fever.  Take all medicines as directed by your caregiver.  Keep all your follow-up appointments and Pap tests as directed by your caregiver. SEEK IMMEDIATE MEDICAL CARE IF:   You have bleeding that is heavier or longer than a normal menstrual cycle.  You have bleeding that is bright red.  You have blood clots.  You have a fever.  You have increasing cramps or pain not relieved by medicine.  You develop abdominal pain that does not seem to be related to the same area of earlier cramping and pain.  You are lightheaded, unusually weak, or faint.  You develop painful or bloody urination.  You develop a bad smelling vaginal discharge. MAKE SURE YOU:  Understand these instructions.  Will watch your condition.  Will get help right away if you are not doing well or get worse.   This information is not intended to replace advice given to you by your health care provider. Make sure you discuss any questions you have with your health care provider.   Document Released: 01/01/2011 Document Revised: 05/11/2014 Document Reviewed: 01/01/2011 Elsevier Interactive Patient Education [employer].

## 2015-10-01 ENCOUNTER — Telehealth: Payer: Self-pay

## 2015-10-01 NOTE — Telephone Encounter (Signed)
Pt has been informed of lab results and will follow up in 4 weeks.

## 2015-10-02 ENCOUNTER — Telehealth: Payer: Self-pay | Admitting: Gastroenterology

## 2015-10-02 ENCOUNTER — Telehealth: Payer: Self-pay | Admitting: General Practice

## 2015-10-02 NOTE — Telephone Encounter (Signed)
Patient is recovering from colposcopy. She is seeing a discharge in her pull-up adult diaper. She has about 2 bowel movements each day. She notes what she thinks may be blood on her tissue after a bowel movement. She denies constipation or straining for a bowel movement. She will use moistened tissue for cleaning and contact the GYN about vaginal discharge.

## 2015-10-02 NOTE — Telephone Encounter (Signed)
Patient called into front office stating since the procedure she has had a blackish d/c and a very small amount of blood when she wipes and she has been quite concerned. Patient states she didn't know if she was supposed to call her GI dr because maybe it was from her rectum or [place]. Reassured patient that the d/c is normal following the LEEP procedure because of the solution applied to her cervix to help stop the bleeding. Discussed warning signs of infection with patient and to call us back if that occurs. Patient verbalized understanding & I reminded her of her appt with [place] on 6/29. Patient verbalized understanding and states someone called and talked to her daughter about the appt and reminded her of the appt & she is unsure why someone called her daughter and not her. Apologized to patient stating I am unsure why that occurred but her number is our preferred contact number & verified the number with her. Patient verbalized understanding & had no questions

## 2015-10-07 ENCOUNTER — Telehealth: Payer: Self-pay | Admitting: *Deleted

## 2015-10-07 NOTE — Telephone Encounter (Signed)
Emily [name] called and left a message today that on Friday 10/04/15 she started bleeding real heavy bright red and was still bleeding on Saturday. States she did not have a fever. States she has been feeling dizzy, lightheaded and scared to go out because she thought she might fall.  States the bleeding stopped Saturday evening and started back on Sunday and stopped again today.  Wants to know why she is feeling that way.   I called Emily [name] and she states the bleeding was bright red and it was heavy but it  Has stopped - none today except a little blackish. I asked her if she was sure it was vaginal bleeding not rectal and she states she is sure it was vaginal.  She states she was cramping real bad Friday night/saturday am and took hydrocodone and then  A muscle relaxant and then later another hydrocodone - states didn't really help much.  She states today she is not bleeding any bright red blood, but did have alittle of the blackish discharge. She denies any dizziness or lightheadedness.  We discussed dizzyness could be if her blood count dropped low or could be from the pain medicine.  Her last hemoglobin was good.  We discused if she has any further bright red bleeding to have someone bring her  to [facility] for evaluation or if she feels dizzy or lightheaded. I also advised her if the blackish discharge continues to call back to clinic in a day or two. We also discussed ibuprofen is best for cramping but she is not able to take iburprofen. She voices understanding.

## 2015-10-10 ENCOUNTER — Other Ambulatory Visit: Payer: Self-pay

## 2015-10-10 ENCOUNTER — Ambulatory Visit (HOSPITAL_COMMUNITY): Payer: Medicare Other | Attending: Cardiovascular Disease

## 2015-10-10 DIAGNOSIS — E119 Type 2 diabetes mellitus without complications: Secondary | ICD-10-CM | POA: Insufficient documentation

## 2015-10-10 DIAGNOSIS — I119 Hypertensive heart disease without heart failure: Secondary | ICD-10-CM | POA: Diagnosis not present

## 2015-10-10 DIAGNOSIS — R079 Chest pain, unspecified: Secondary | ICD-10-CM | POA: Diagnosis not present

## 2015-10-10 LAB — ECHOCARDIOGRAM COMPLETE
E decel time: 225 msec
E/e' ratio: 10.66
FS: 39 % (ref 28–44)
IVS/LV PW RATIO, ED: 0.86
LA ID, A-P, ES: 43 mm
LA diam end sys: 43 mm
LA diam index: 2.76 cm/m2
LA vol A4C: 33 ml
LA vol index: 28.8 mL/m2
LA vol: 45 mL
LV E/e' medial: 10.66
LV E/e'average: 10.66
LV PW d: 10.3 mm — AB (ref 0.6–1.1)
LV e' LATERAL: 6.25 cm/s
LVOT SV: 72 mL
LVOT VTI: 23 cm
LVOT area: 3.14 cm2
LVOT diameter: 20 mm
LVOT peak vel: 88.7 cm/s
MV Dec: 225
MV pk A vel: 83.9 m/s
MV pk E vel: 66.6 m/s
Reg peak vel: 248 cm/s
TDI e' lateral: 6.25
TDI e' medial: 5.7
TR max vel: 248 cm/s

## 2015-10-31 ENCOUNTER — Ambulatory Visit: Payer: Medicare Other | Admitting: Obstetrics & Gynecology

## 2015-10-31 ENCOUNTER — Encounter: Payer: Self-pay | Admitting: Obstetrics & Gynecology

## 2015-10-31 VITALS — BP 128/56 | HR 68 | Ht 62.0 in | Wt 124.0 lb

## 2015-10-31 DIAGNOSIS — Z9889 Other specified postprocedural states: Secondary | ICD-10-CM

## 2015-10-31 NOTE — Patient Instructions (Signed)
Loop Electrosurgical Excision Procedure, Care After Refer to this sheet in the next few weeks. These instructions provide you with information on caring for yourself after your procedure. Your caregiver may also give you more specific instructions. Your treatment has been planned according to current medical practices, but problems sometimes occur. Call your caregiver if you have any problems or questions after your procedure. HOME CARE INSTRUCTIONS   Do not use tampons, douche, or have sexual intercourse for 2 weeks or as directed by your caregiver.  Begin normal activities if you have no or minimal cramping or bleeding, unless directed otherwise by your caregiver.  Take your temperature if you feel sick. Write down your temperature on paper, and tell your caregiver if you have a fever.  Take all medicines as directed by your caregiver.  Keep all your follow-up appointments and Pap tests as directed by your caregiver. SEEK IMMEDIATE MEDICAL CARE IF:   You have bleeding that is heavier or longer than a normal menstrual cycle.  You have bleeding that is bright red.  You have blood clots.  You have a fever.  You have increasing cramps or pain not relieved by medicine.  You develop abdominal pain that does not seem to be related to the same area of earlier cramping and pain.  You are lightheaded, unusually weak, or faint.  You develop painful or bloody urination.  You develop a bad smelling vaginal discharge. MAKE SURE YOU:  Understand these instructions.  Will watch your condition.  Will get help right away if you are not doing well or get worse.   This information is not intended to replace advice given to you by your health care provider. Make sure you discuss any questions you have with your health care provider.   Document Released: 01/01/2011 Document Revised: 05/11/2014 Document Reviewed: 01/01/2011 Elsevier Interactive Patient Education [employer].

## 2015-10-31 NOTE — Progress Notes (Signed)
Patient ID: Emily [name], female   DOB: Apr 06, 1941, 75 y.o.   MRN: [id number] History:  75 y.o. No obstetric history on file. here today for post LEEP.  Pt reports that she had bleeding after the procedure and she called but, after she was given info on what to do the bleeding stopped.  She denies abnormal discharge.       The following portions of the patient's history were reviewed and updated as appropriate: allergies, current medications, past family history, past medical history, past social history, past surgical history and problem list.  Review of Systems:  Pertinent items are noted in HPI.  Objective:  Physical Exam Blood pressure 128/56, pulse 68, height 5\' 2"  (1.575 m), weight 124 lb (56.246 kg). Gen: NAD Abd: Soft, nontender and nondistended Pelvic: Normal appearing external genitalia; normal appearing vaginal mucosa and cervix.  Normal discharge.  Small uterus, no other palpable masses, no uterine or adnexal tenderness  Labs and Imaging 09/25/2015 Diagnosis Cervix, LEEP - MILD SQUAMOUS DYSPLASIA (CIN-I). - THE SURGICAL RESECTION MARGINS APPEAR NEGATIVE FOR SQUAMOUS DYSPLASIA. - SEE COMMENT.  Assessment & Plan:  Post LEEP check  Pt doing well  F/u 6 months or sooner prn  Alanta Scobey L. Harraway-Smith, M.D., Cherlynn June

## 2015-11-12 IMAGING — CR DG ABDOMEN 2V
2 series · 2 of 2 positions shown · non-contrast
Comparison: None.

CLINICAL DATA: Abdominal pain.

EXAM:
ABDOMEN - 2 VIEW

[view not recorded (1 of 2)]
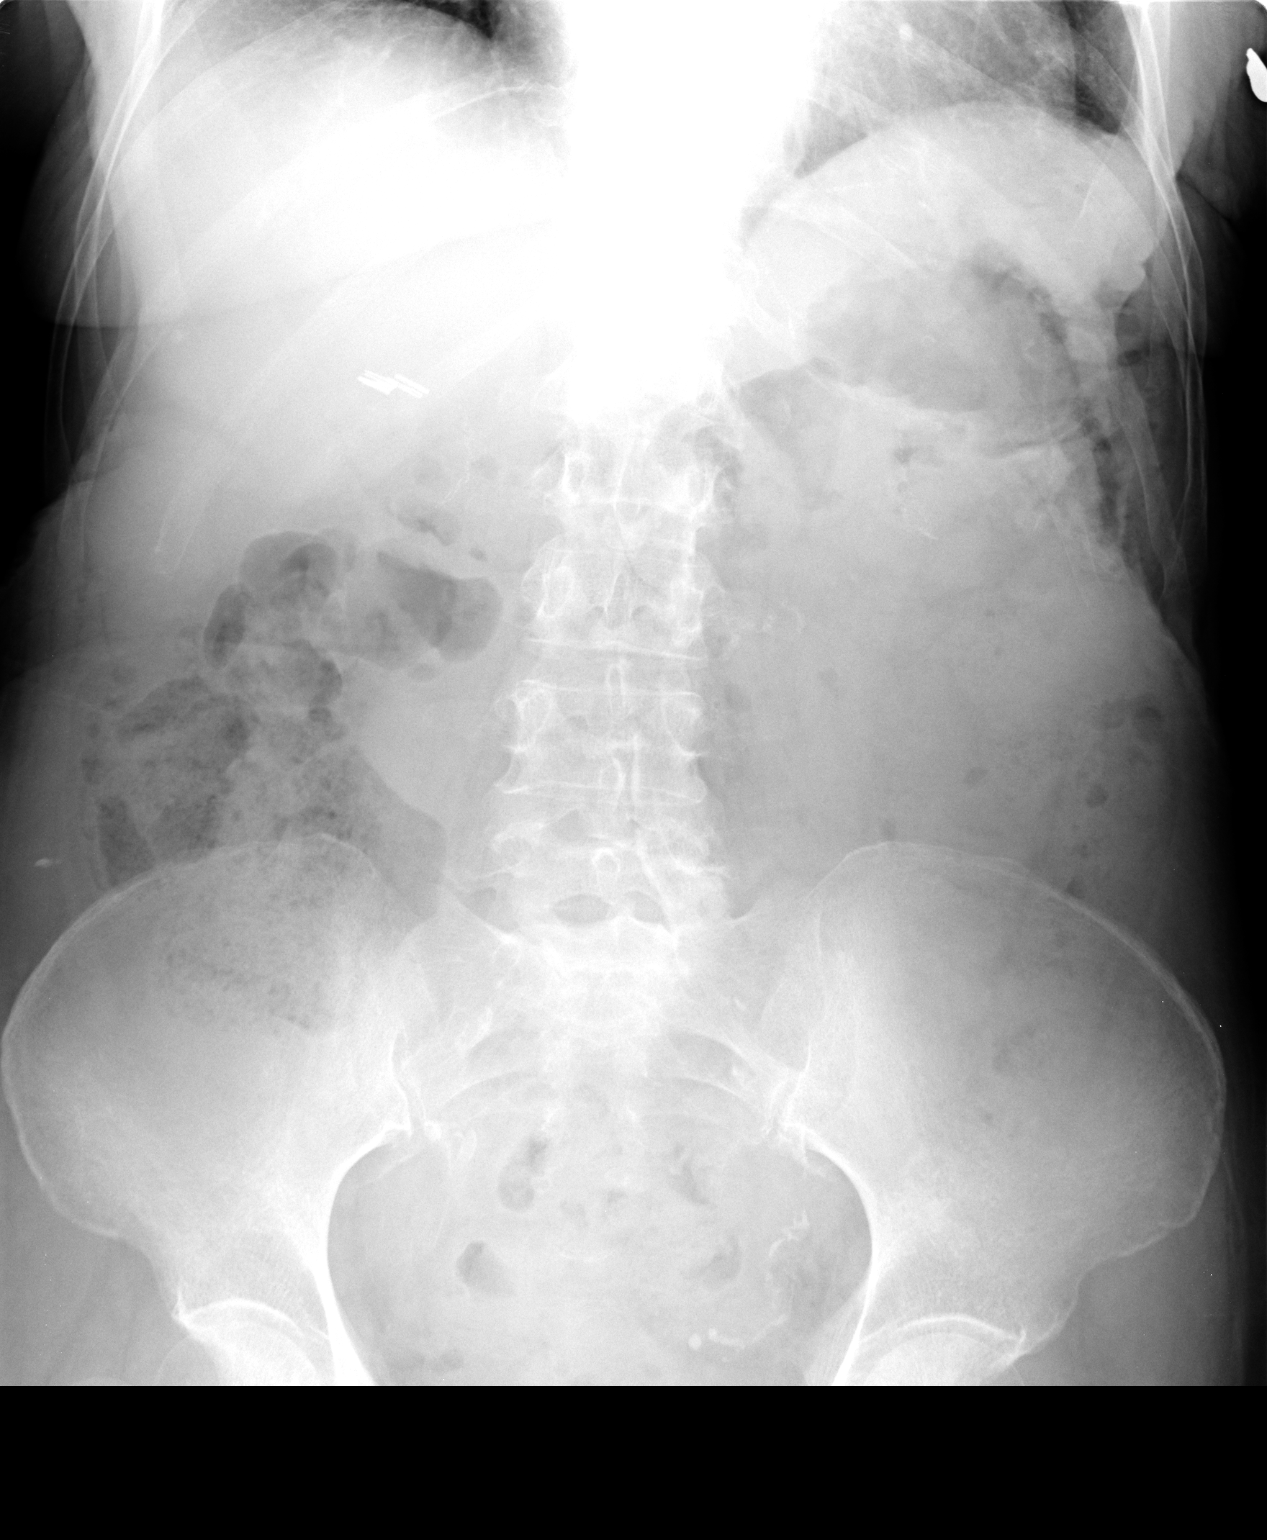

[view not recorded (2 of 2)]
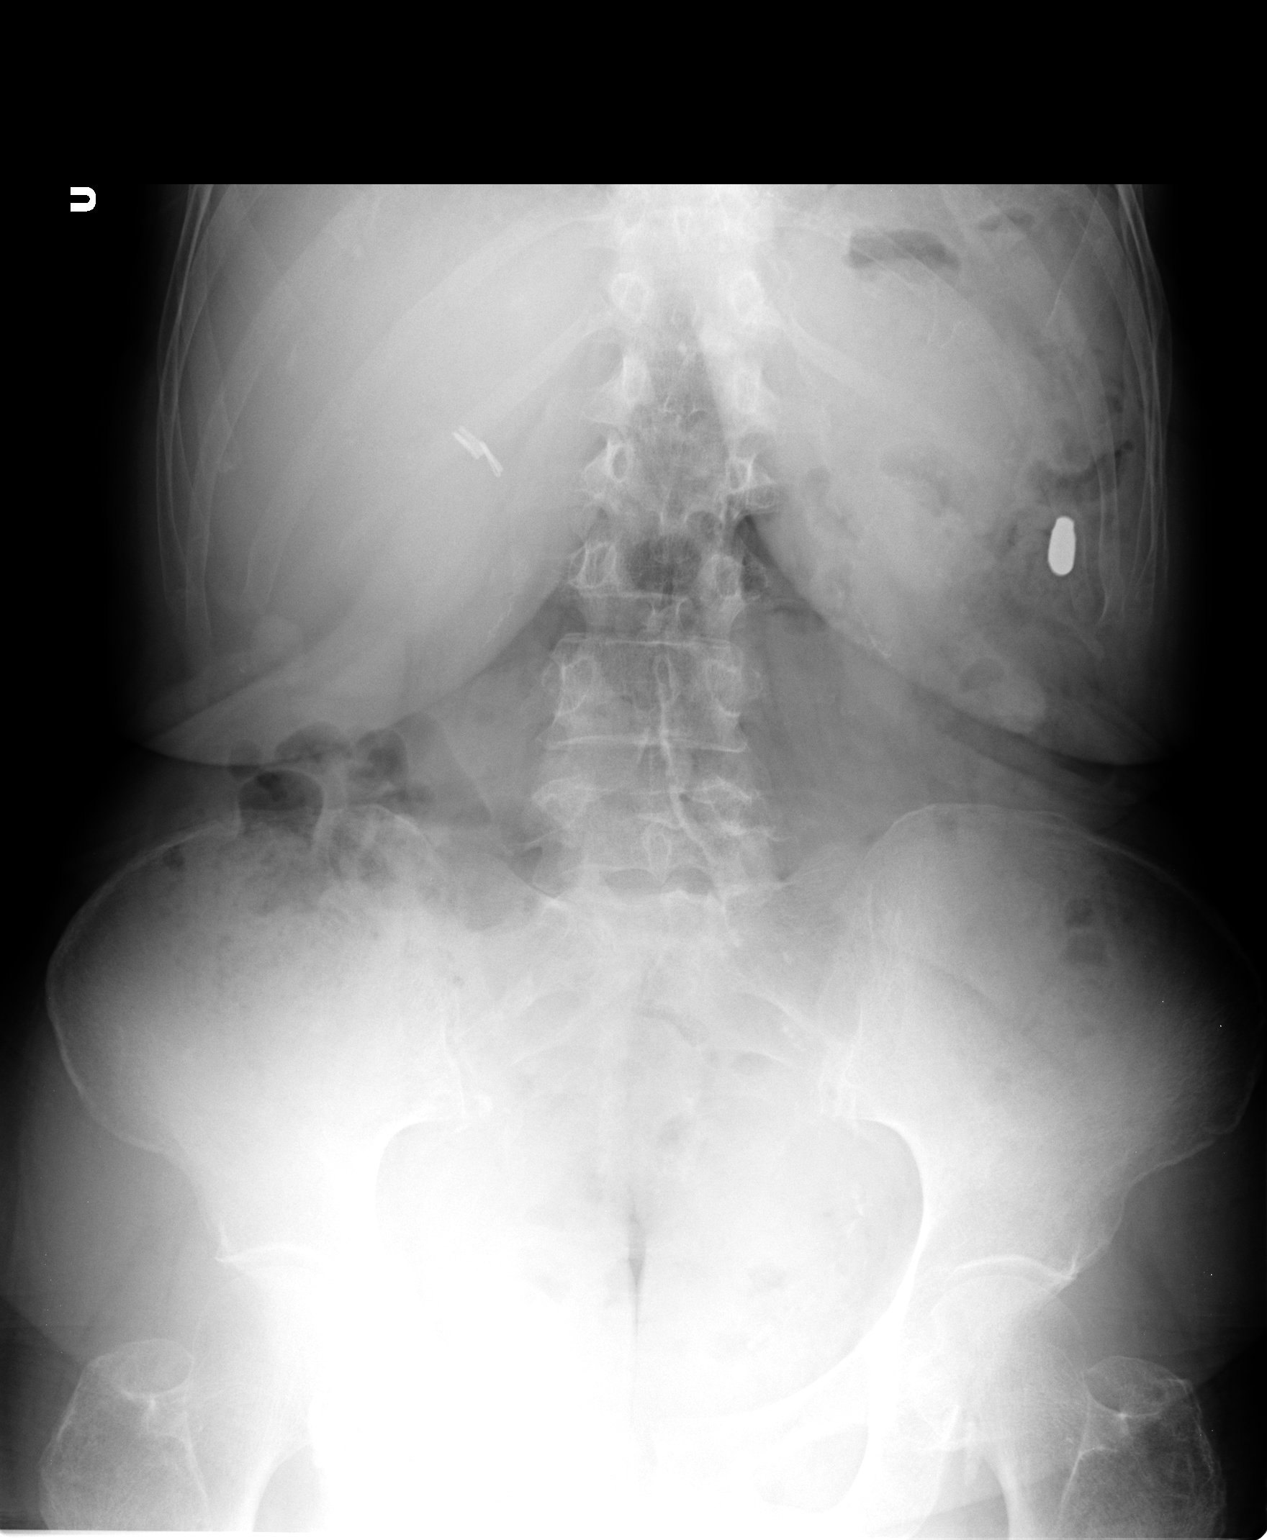

[2 of 2 positions shown; findings below may reference images not displayed]

FINDINGS: Soft tissue structures are unremarkable. No bowel distention. No
free air. Surgical clips right upper quadrant. No acute bony
abnormality. Aortoiliac atherosclerotic vascular disease.
IMPRESSION: No acute abnormality.

## 2015-11-13 ENCOUNTER — Ambulatory Visit (INDEPENDENT_AMBULATORY_CARE_PROVIDER_SITE_OTHER): Payer: Medicare Other | Admitting: Podiatry

## 2015-11-13 ENCOUNTER — Encounter: Payer: Self-pay | Admitting: Podiatry

## 2015-11-13 DIAGNOSIS — B351 Tinea unguium: Secondary | ICD-10-CM

## 2015-11-13 DIAGNOSIS — M79676 Pain in unspecified toe(s): Secondary | ICD-10-CM

## 2015-11-13 NOTE — Patient Instructions (Signed)
Diabetes and Foot Care Diabetes may cause you to have problems because of poor blood supply (circulation) to your feet and legs. This may cause the skin on your feet to become thinner, break easier, and heal more slowly. Your skin may become dry, and the skin may peel and crack. You may also have nerve damage in your legs and feet causing decreased feeling in them. You may not notice minor injuries to your feet that could lead to infections or more serious problems. Taking care of your feet is one of the most important things you can do for yourself.  HOME CARE INSTRUCTIONS  Wear shoes at all times, even in the house. Do not go barefoot. Bare feet are easily injured.  Check your feet daily for blisters, cuts, and redness. If you cannot see the bottom of your feet, use a mirror or ask someone for help.  Wash your feet with warm water (do not use hot water) and mild soap. Then pat your feet and the areas between your toes until they are completely dry. Do not soak your feet as this can dry your skin.  Apply a moisturizing lotion or petroleum jelly (that does not contain alcohol and is unscented) to the skin on your feet and to dry, brittle toenails. Do not apply lotion between your toes.  Trim your toenails straight across. Do not dig under them or around the cuticle. File the edges of your nails with an emery board or nail file.  Do not cut corns or calluses or try to remove them with medicine.  Wear clean socks or stockings every day. Make sure they are not too tight. Do not wear knee-high stockings since they may decrease blood flow to your legs.  Wear shoes that fit properly and have enough cushioning. To break in new shoes, wear them for just a few hours a day. This prevents you from injuring your feet. Always look in your shoes before you put them on to be sure there are no objects inside.  Do not cross your legs. This may decrease the blood flow to your feet.  If you find a minor scrape,  cut, or break in the skin on your feet, keep it and the skin around it clean and dry. These areas may be cleansed with mild soap and water. Do not cleanse the area with peroxide, alcohol, or iodine.  When you remove an adhesive bandage, be sure not to damage the skin around it.  If you have a wound, look at it several times a day to make sure it is healing.  Do not use heating pads or hot water bottles. They may burn your skin. If you have lost feeling in your feet or legs, you may not know it is happening until it is too late.  Make sure your health care provider performs a complete foot exam at least annually or more often if you have foot problems. Report any cuts, sores, or bruises to your health care provider immediately. SEEK MEDICAL CARE IF:   You have an injury that is not healing.  You have cuts or breaks in the skin.  You have an ingrown nail.  You notice redness on your legs or feet.  You feel burning or tingling in your legs or feet.  You have pain or cramps in your legs and feet.  Your legs or feet are numb.  Your feet always feel cold. SEEK IMMEDIATE MEDICAL CARE IF:   There is increasing redness,   swelling, or pain in or around a wound.  There is a red line that goes up your leg.  Pus is coming from a wound.  You develop a fever or as directed by your health care provider.  You notice a bad smell coming from an ulcer or wound.   This information is not intended to replace advice given to you by your health care provider. Make sure you discuss any questions you have with your health care provider.   Document Released: 04/17/2000 Document Revised: 12/21/2012 Document Reviewed: 09/27/2012 Elsevier Interactive Patient Education 2016 [employer]  

## 2015-11-14 NOTE — Progress Notes (Signed)
Patient ID: Emily [name], female   DOB: 12-Apr-1941, 75 y.o.   MRN: [id number]   Subjective: This patient presents today complaining of thickened and elongated toenails which or cough walking wearing shoes and requests toenail debridement patient states that all itching has resolved from use of Naftin cream prescribed on the visit of 06/26/2015  Objective: Orientated 3 DP and PT pulses 2/4 bilaterally Capillary reflex immediate bilaterally Sensation to 10 g monofilament wire intact 3/5 right 4/5 left Vibratory sensation reactive bilaterally Ankle reflex equal and reactive bilaterally No open skin lesions bilaterally The toenails are elongated, hypertrophic, deformed, discolored and tender to direct palpation 6-10 Pes planus bilaterally  Assessment: Diabetic with satisfactory neurovascular status Symptomatic onychomycoses 6-10  Plan: Debridement toenails 6-10 mechanically electrically without any bleeding  Reappoint 3 months

## 2015-12-22 ENCOUNTER — Encounter: Payer: Self-pay | Admitting: Gastroenterology

## 2016-01-09 ENCOUNTER — Ambulatory Visit: Payer: Medicare Other | Admitting: Physical Therapy

## 2016-01-13 ENCOUNTER — Ambulatory Visit: Payer: Medicare Other | Admitting: Physical Therapy

## 2016-01-17 ENCOUNTER — Ambulatory Visit: Payer: Medicare Other | Attending: Orthopaedic Surgery | Admitting: Physical Therapy

## 2016-01-17 ENCOUNTER — Encounter: Payer: Self-pay | Admitting: Physical Therapy

## 2016-01-17 DIAGNOSIS — M6281 Muscle weakness (generalized): Secondary | ICD-10-CM | POA: Insufficient documentation

## 2016-01-17 DIAGNOSIS — M5441 Lumbago with sciatica, right side: Secondary | ICD-10-CM | POA: Diagnosis present

## 2016-01-17 DIAGNOSIS — M25562 Pain in left knee: Secondary | ICD-10-CM | POA: Diagnosis present

## 2016-01-17 DIAGNOSIS — M25561 Pain in right knee: Secondary | ICD-10-CM | POA: Diagnosis present
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
## 2016-01-21 ENCOUNTER — Ambulatory Visit: Payer: Medicare Other | Admitting: Physical Therapy

## 2016-01-21 DIAGNOSIS — M25561 Pain in right knee: Secondary | ICD-10-CM

## 2016-01-21 DIAGNOSIS — M6281 Muscle weakness (generalized): Secondary | ICD-10-CM

## 2016-01-21 DIAGNOSIS — M5441 Lumbago with sciatica, right side: Secondary | ICD-10-CM | POA: Diagnosis not present

## 2016-01-21 DIAGNOSIS — M25562 Pain in left knee: Secondary | ICD-10-CM
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
## 2016-01-21 NOTE — Patient Instructions (Signed)
Pelvic Tilt: Posterior - Legs Bent (Supine)    Gently firm your belly and flatten back by rolling pelvis down. Hold __5-10__ seconds. Relax. Repeat _10___ times per set. Do _1-2___ sets per session. Do ___2_ sessions per day.  http://orth.exer.us/203   Copyright  VHI. All rights reserved.  HIP: Hamstrings - Short Sitting    Rest leg on raised surface. Keep knee straight. Lift chest. Hold __30_ seconds. __2-3_ reps per set, __2_ sets per day, __5-7_ days per week  Copyright  VHI. All rights reserved.

## 2016-01-27 ENCOUNTER — Ambulatory Visit: Payer: Medicare Other | Admitting: Physical Therapy

## 2016-01-27 DIAGNOSIS — M25562 Pain in left knee: Secondary | ICD-10-CM

## 2016-01-27 DIAGNOSIS — M25561 Pain in right knee: Secondary | ICD-10-CM

## 2016-01-27 DIAGNOSIS — M6281 Muscle weakness (generalized): Secondary | ICD-10-CM

## 2016-01-27 DIAGNOSIS — M5441 Lumbago with sciatica, right side: Secondary | ICD-10-CM | POA: Diagnosis not present

## 2016-01-27 NOTE — Patient Instructions (Signed)
QL info for scooting swinging hips to the right. And Standing stretch right only  Only if she gets spasms.  1-2 reps 20 seconds.
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
## 2016-01-31 ENCOUNTER — Ambulatory Visit: Payer: Medicare Other | Admitting: Physical Therapy

## 2016-01-31 VITALS — BP 166/87 | HR 88

## 2016-01-31 DIAGNOSIS — M5441 Lumbago with sciatica, right side: Secondary | ICD-10-CM

## 2016-01-31 DIAGNOSIS — M25561 Pain in right knee: Secondary | ICD-10-CM

## 2016-01-31 DIAGNOSIS — M6281 Muscle weakness (generalized): Secondary | ICD-10-CM

## 2016-01-31 DIAGNOSIS — M25562 Pain in left knee: Secondary | ICD-10-CM
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
## 2016-02-03 ENCOUNTER — Encounter: Payer: Self-pay | Admitting: Gastroenterology

## 2016-02-04 ENCOUNTER — Ambulatory Visit: Payer: Medicare Other | Attending: Orthopaedic Surgery | Admitting: Physical Therapy

## 2016-02-04 DIAGNOSIS — M6281 Muscle weakness (generalized): Secondary | ICD-10-CM | POA: Diagnosis present

## 2016-02-04 DIAGNOSIS — G8929 Other chronic pain: Secondary | ICD-10-CM | POA: Diagnosis present

## 2016-02-04 DIAGNOSIS — M25562 Pain in left knee: Secondary | ICD-10-CM | POA: Insufficient documentation

## 2016-02-04 DIAGNOSIS — M25561 Pain in right knee: Secondary | ICD-10-CM | POA: Insufficient documentation

## 2016-02-04 DIAGNOSIS — M5441 Lumbago with sciatica, right side: Secondary | ICD-10-CM | POA: Diagnosis present
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
## 2016-02-06 ENCOUNTER — Ambulatory Visit: Payer: Medicare Other | Admitting: Physical Therapy

## 2016-02-06 DIAGNOSIS — M6281 Muscle weakness (generalized): Secondary | ICD-10-CM

## 2016-02-06 DIAGNOSIS — G8929 Other chronic pain: Secondary | ICD-10-CM

## 2016-02-06 DIAGNOSIS — M25561 Pain in right knee: Secondary | ICD-10-CM

## 2016-02-06 DIAGNOSIS — M5441 Lumbago with sciatica, right side: Principal | ICD-10-CM

## 2016-02-06 DIAGNOSIS — M25562 Pain in left knee: Secondary | ICD-10-CM
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
## 2016-02-11 ENCOUNTER — Ambulatory Visit: Payer: Medicare Other | Admitting: Physical Therapy

## 2016-02-11 DIAGNOSIS — M5441 Lumbago with sciatica, right side: Secondary | ICD-10-CM | POA: Diagnosis not present

## 2016-02-11 DIAGNOSIS — M25561 Pain in right knee: Secondary | ICD-10-CM

## 2016-02-11 DIAGNOSIS — G8929 Other chronic pain: Secondary | ICD-10-CM

## 2016-02-11 DIAGNOSIS — M25562 Pain in left knee: Secondary | ICD-10-CM

## 2016-02-11 DIAGNOSIS — M6281 Muscle weakness (generalized): Secondary | ICD-10-CM

## 2016-02-11 NOTE — Patient Instructions (Signed)
   Abduction: Clam (Eccentric) - Side-Lying    Lie on side with knees bent. Lift top knee, keeping feet together. Keep trunk steady. Slowly lower for 3-5 seconds. _10__ reps per set, _1__ sets per day, __5_ days per week.  http://ecce.exer.us/65   Copyright  VHI. All rights reserved.     Abduction: Side Leg Lift (Eccentric) - Side-Lying    Lie on side. Lift top leg slightly higher than shoulder level. Keep top leg straight with body, toes pointing forward. Slowly lower for 3-5 seconds. _10__ reps per set, _1__ sets per day, _3-5__ days per week.  http://ecce.exer.us/63   Copyright  VHI. All rights reserved.
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
## 2016-02-12 ENCOUNTER — Encounter: Payer: Self-pay | Admitting: Podiatry

## 2016-02-12 ENCOUNTER — Ambulatory Visit (INDEPENDENT_AMBULATORY_CARE_PROVIDER_SITE_OTHER): Payer: Medicare Other | Admitting: Podiatry

## 2016-02-12 VITALS — BP 128/72 | HR 82 | Resp 18

## 2016-02-12 DIAGNOSIS — M79676 Pain in unspecified toe(s): Secondary | ICD-10-CM

## 2016-02-12 DIAGNOSIS — B351 Tinea unguium: Secondary | ICD-10-CM

## 2016-02-12 NOTE — Progress Notes (Signed)
Patient ID: Emily [name], female   DOB: 1941/03/08, 75 y.o.   MRN: [id number]    Subjective: This patient presents today complaining of thickened and elongated toenails which or cough walking wearing shoes and requests toenail debridement patient states that all itching has resolved from use of Naftin cream prescribed on the visit of 06/26/2015  Objective: Orientated 3 DP and PT pulses 2/4 bilaterally Capillary reflex immediate bilaterally Sensation to 10 g monofilament wire intact 3/5 right 4/5 left Vibratory sensation reactive bilaterally Ankle reflex equal and reactive bilaterally No open skin lesions bilaterally Dry scaling skin heals without fissures The toenails are elongated, hypertrophic, deformed, discolored and tender to direct palpation 6-10 Pes planus bilaterally  Assessment: Diabetic with satisfactory neurovascular status Symptomatic onychomycoses 6-10  Plan: Debridement toenails 6-10 mechanically electrically without any bleeding Started patient to continue applying moisturizing cream or Vaseline to the heels daily  Reappoint 3 months

## 2016-02-12 NOTE — Patient Instructions (Signed)
Diabetes and Foot Care Diabetes may cause you to have problems because of poor blood supply (circulation) to your feet and legs. This may cause the skin on your feet to become thinner, break easier, and heal more slowly. Your skin may become dry, and the skin may peel and crack. You may also have nerve damage in your legs and feet causing decreased feeling in them. You may not notice minor injuries to your feet that could lead to infections or more serious problems. Taking care of your feet is one of the most important things you can do for yourself.  HOME CARE INSTRUCTIONS  Wear shoes at all times, even in the house. Do not go barefoot. Bare feet are easily injured.  Check your feet daily for blisters, cuts, and redness. If you cannot see the bottom of your feet, use a mirror or ask someone for help.  Wash your feet with warm water (do not use hot water) and mild soap. Then pat your feet and the areas between your toes until they are completely dry. Do not soak your feet as this can dry your skin.  Apply a moisturizing lotion or petroleum jelly (that does not contain alcohol and is unscented) to the skin on your feet and to dry, brittle toenails. Do not apply lotion between your toes.  Trim your toenails straight across. Do not dig under them or around the cuticle. File the edges of your nails with an emery board or nail file.  Do not cut corns or calluses or try to remove them with medicine.  Wear clean socks or stockings every day. Make sure they are not too tight. Do not wear knee-high stockings since they may decrease blood flow to your legs.  Wear shoes that fit properly and have enough cushioning. To break in new shoes, wear them for just a few hours a day. This prevents you from injuring your feet. Always look in your shoes before you put them on to be sure there are no objects inside.  Do not cross your legs. This may decrease the blood flow to your feet.  If you find a minor scrape,  cut, or break in the skin on your feet, keep it and the skin around it clean and dry. These areas may be cleansed with mild soap and water. Do not cleanse the area with peroxide, alcohol, or iodine.  When you remove an adhesive bandage, be sure not to damage the skin around it.  If you have a wound, look at it several times a day to make sure it is healing.  Do not use heating pads or hot water bottles. They may burn your skin. If you have lost feeling in your feet or legs, you may not know it is happening until it is too late.  Make sure your health care provider performs a complete foot exam at least annually or more often if you have foot problems. Report any cuts, sores, or bruises to your health care provider immediately. SEEK MEDICAL CARE IF:   You have an injury that is not healing.  You have cuts or breaks in the skin.  You have an ingrown nail.  You notice redness on your legs or feet.  You feel burning or tingling in your legs or feet.  You have pain or cramps in your legs and feet.  Your legs or feet are numb.  Your feet always feel cold. SEEK IMMEDIATE MEDICAL CARE IF:   There is increasing redness,   swelling, or pain in or around a wound.  There is a red line that goes up your leg.  Pus is coming from a wound.  You develop a fever or as directed by your health care provider.  You notice a bad smell coming from an ulcer or wound.   This information is not intended to replace advice given to you by your health care provider. Make sure you discuss any questions you have with your health care provider.   Document Released: 04/17/2000 Document Revised: 12/21/2012 Document Reviewed: 09/27/2012 Elsevier Interactive Patient Education 2016 [employer]  

## 2016-02-13 ENCOUNTER — Ambulatory Visit: Payer: Medicare Other | Admitting: Physical Therapy

## 2016-02-13 DIAGNOSIS — M25561 Pain in right knee: Secondary | ICD-10-CM

## 2016-02-13 DIAGNOSIS — M5441 Lumbago with sciatica, right side: Secondary | ICD-10-CM

## 2016-02-13 DIAGNOSIS — G8929 Other chronic pain: Secondary | ICD-10-CM

## 2016-02-13 DIAGNOSIS — M25562 Pain in left knee: Secondary | ICD-10-CM

## 2016-02-13 DIAGNOSIS — M6281 Muscle weakness (generalized): Secondary | ICD-10-CM
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
## 2016-02-13 NOTE — Patient Instructions (Signed)
Fall prevention handouts.  Bathroom focus.

## 2016-02-28 ENCOUNTER — Other Ambulatory Visit: Payer: Self-pay | Admitting: Gastroenterology

## 2016-03-16 ENCOUNTER — Ambulatory Visit (INDEPENDENT_AMBULATORY_CARE_PROVIDER_SITE_OTHER): Payer: Self-pay

## 2016-03-16 ENCOUNTER — Ambulatory Visit (INDEPENDENT_AMBULATORY_CARE_PROVIDER_SITE_OTHER): Payer: Medicare Other | Admitting: Orthopaedic Surgery

## 2016-03-16 DIAGNOSIS — G8929 Other chronic pain: Secondary | ICD-10-CM

## 2016-03-16 DIAGNOSIS — M5441 Lumbago with sciatica, right side: Secondary | ICD-10-CM

## 2016-03-16 DIAGNOSIS — M5442 Lumbago with sciatica, left side: Secondary | ICD-10-CM | POA: Diagnosis not present

## 2016-03-16 DIAGNOSIS — M25562 Pain in left knee: Secondary | ICD-10-CM

## 2016-03-16 DIAGNOSIS — M25561 Pain in right knee: Secondary | ICD-10-CM | POA: Diagnosis not present

## 2016-03-16 NOTE — Addendum Note (Signed)
Addended by: Jacklyn Shell on: 03/16/2016 04:30 PM   Modules accepted: Orders
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
## 2016-03-16 NOTE — Addendum Note (Signed)
Addended by: Jacklyn Shell on: 03/16/2016 04:53 PM   Modules accepted: Orders

## 2016-03-24 ENCOUNTER — Ambulatory Visit
Admission: RE | Admit: 2016-03-24 | Discharge: 2016-03-24 | Disposition: A | Discharge status: Home / Self Care | Payer: Medicare Other | Source: Ambulatory Visit | Attending: Orthopaedic Surgery | Admitting: Orthopaedic Surgery

## 2016-03-24 DIAGNOSIS — M5441 Lumbago with sciatica, right side: Principal | ICD-10-CM

## 2016-03-24 DIAGNOSIS — M5442 Lumbago with sciatica, left side: Principal | ICD-10-CM

## 2016-03-24 DIAGNOSIS — G8929 Other chronic pain: Secondary | ICD-10-CM

## 2016-03-25 ENCOUNTER — Ambulatory Visit (AMBULATORY_SURGERY_CENTER): Payer: Self-pay | Admitting: *Deleted

## 2016-03-25 VITALS — Ht 62.0 in | Wt 131.0 lb

## 2016-03-25 DIAGNOSIS — K2270 Barrett's esophagus without dysplasia: Secondary | ICD-10-CM

## 2016-03-25 NOTE — Progress Notes (Signed)
No egg or soy allergy known to patient  issues with past sedation with any surgeries  or procedures of post op n/v, no intubation problems  No diet pills per patient No home 02 use per patient  No blood thinners per patient  Pt denies issues with constipation  No A fib or A flutter

## 2016-03-30 ENCOUNTER — Ambulatory Visit (INDEPENDENT_AMBULATORY_CARE_PROVIDER_SITE_OTHER): Payer: Medicare Other | Admitting: Orthopaedic Surgery

## 2016-04-08 ENCOUNTER — Encounter: Payer: Self-pay | Admitting: Gastroenterology

## 2016-04-08 ENCOUNTER — Ambulatory Visit (AMBULATORY_SURGERY_CENTER): Payer: Medicare Other | Admitting: Gastroenterology

## 2016-04-08 VITALS — BP 131/95 | HR 70 | Temp 96.8°F | Resp 20 | Ht 62.0 in | Wt 124.0 lb

## 2016-04-08 DIAGNOSIS — K2270 Barrett's esophagus without dysplasia: Secondary | ICD-10-CM

## 2016-04-08 HISTORY — PX: UPPER GASTROINTESTINAL ENDOSCOPY: SHX188

## 2016-04-08 LAB — GLUCOSE, CAPILLARY
Glucose-Capillary: 81 mg/dL (ref 65–99)
Glucose-Capillary: 89 mg/dL (ref 65–99)
Glucose-Capillary: 150 mg/dL — ABNORMAL HIGH (ref 65–99)

## 2016-04-08 MED ORDER — DEXTROSE 5 % IV SOLN: INTRAVENOUS | Status: DC

## 2016-04-08 MED ORDER — SODIUM CHLORIDE 0.9 % IV SOLN: 500.0000 mL | INTRAVENOUS | Status: DC

## 2016-04-08 NOTE — Progress Notes (Signed)
Called to room to assist during endoscopic procedure.  Patient ID and intended procedure confirmed with present staff. Received instructions for my participation in the procedure from the performing physician.  

## 2016-04-08 NOTE — Patient Instructions (Signed)
YOU HAD AN ENDOSCOPIC PROCEDURE TODAY AT [facility]:   Refer to the procedure report that was given to you for any specific questions about what was found during the examination.  If the procedure report does not answer your questions, please call your gastroenterologist to clarify.  If you requested that your care partner not be given the details of your procedure findings, then the procedure report has been included in a sealed envelope for you to review at your convenience later.  YOU SHOULD EXPECT: Some feelings of bloating in the abdomen. Passage of more gas than usual.  Walking can help get rid of the air that was put into your GI tract during the procedure and reduce the bloating. If you had a lower endoscopy (such as a colonoscopy or flexible sigmoidoscopy) you may notice spotting of blood in your stool or on the toilet paper. If you underwent a bowel prep for your procedure, you may not have a normal bowel movement for a few days.  Please Note:  You might notice some irritation and congestion in your nose or some drainage.  This is from the oxygen used during your procedure.  There is no need for concern and it should clear up in a day or so.  SYMPTOMS TO REPORT IMMEDIATELY:    Following upper endoscopy (EGD)  Vomiting of blood or coffee ground material  New chest pain or pain under the shoulder blades  Painful or persistently difficult swallowing  New shortness of breath  Fever of 100F or higher  Black, tarry-looking stools  For urgent or emergent issues, a gastroenterologist can be reached at any hour by calling [phone].   DIET:  We do recommend a small meal at first, but then you may proceed to your regular diet.  Drink plenty of fluids but you should avoid alcoholic beverages for 24 hours.  Please read all handouts given to you by your recovery nurse.  ACTIVITY:  You should plan to take it easy for the rest of today and you should NOT DRIVE or use heavy  machinery until tomorrow (because of the sedation medicines used during the test).    FOLLOW UP: Our staff will call the number listed on your records the next business day following your procedure to check on you and address any questions or concerns that you may have regarding the information given to you following your procedure. If we do not reach you, we will leave a message.  However, if you are feeling well and you are not experiencing any problems, there is no need to return our call.  We will assume that you have returned to your regular daily activities without incident.  If any biopsies were taken you will be contacted by phone or by letter within the next 1-3 weeks.  Please call us at [phone] if you have not heard about the biopsies in 3 weeks.    SIGNATURES/CONFIDENTIALITY: You and/or your care partner have signed paperwork which will be entered into your electronic medical record.  These signatures attest to the fact that that the information above on your After Visit Summary has been reviewed and is understood.  Full responsibility of the confidentiality of this discharge information lies with you and/or your care-partner.  Thank you for letting us take care of your healthcare needs today.

## 2016-04-08 NOTE — Progress Notes (Signed)
To PACU, vss patent aw report to rn 

## 2016-04-08 NOTE — Op Note (Signed)
[facility] Patient Name: Emily [name] Procedure Date: 04/08/2016 12:15 PM MRN: [id number] Endoscopist: Mauri Pole , MD Age: 75 Referring MD:  Date of Birth: 10-30-1940 Gender: Female Account #: [id number] Procedure:                Upper GI endoscopy Indications:              Surveillance for malignancy due to personal history                            of Barrett's esophagus Medicines:                Monitored Anesthesia Care Procedure:                Pre-Anesthesia Assessment:                           - Prior to the procedure, a History and Physical                            was performed, and patient medications and                            allergies were reviewed. The patient's tolerance of                            previous anesthesia was also reviewed. The risks                            and benefits of the procedure and the sedation                            options and risks were discussed with the patient.                            All questions were answered, and informed consent                            was obtained. Prior Anticoagulants: The patient has                            taken no previous anticoagulant or antiplatelet                            agents. ASA Grade Assessment: III - A patient with                            severe systemic disease. After reviewing the risks                            and benefits, the patient was deemed in                            satisfactory condition to undergo the procedure.  After obtaining informed consent, the endoscope was                            passed under direct vision. Throughout the                            procedure, the patient's blood pressure, pulse, and                            oxygen saturations were monitored continuously. The                            Model GIF-[id number]) scope was introduced                            through the mouth,  and advanced to the jejunum. The                            upper GI endoscopy was accomplished without                            difficulty. The patient tolerated the procedure                            well. Scope In: Scope Out: Findings:                 There were esophageal mucosal changes consistent                            with short-segment Barrett's esophagus present in                            the lower third of the esophagus 34 to 32 cm from                            incissiors. The maximum longitudinal extent of                            these mucosal changes was 2 cm in length. Mucosa                            was biopsied with a cold forceps for histology in a                            targeted manner at intervals of 1 cm. One specimen                            bottle was sent to pathology.                           The examined jejunum mucosa was normal. S/p total  gastrodudonectomy. Retained secretions in the                            jejunum with decreased motility Complications:            No immediate complications. Estimated Blood Loss:     Estimated blood loss was minimal. Impression:               - Esophageal mucosal changes consistent with                            short-segment Barrett's esophagus. Biopsied.                           - Normal examined jejunum. Recommendation:           - Patient has a contact number available for                            emergencies. The signs and symptoms of potential                            delayed complications were discussed with the                            patient. Return to normal activities tomorrow.                            Written discharge instructions were provided to the                            patient.                           - Resume previous diet.                           - Continue present medications.                           - Await pathology results.                            - Repeat upper endoscopy in 3 years for                            surveillance based on pathology results.                           - Return to GI clinic PRN. Mauri Pole, MD 04/08/2016 12:37:09 PM This report has been signed electronically.

## 2016-04-09 ENCOUNTER — Telehealth: Payer: Self-pay | Admitting: Gastroenterology

## 2016-04-09 ENCOUNTER — Telehealth: Payer: Self-pay

## 2016-04-09 NOTE — Telephone Encounter (Signed)
Ok to send Rx for Lomotil 1 tab Q12H prn x 30 days with 3 refills. Thanks

## 2016-04-09 NOTE — Telephone Encounter (Signed)
Dr Silverio Decamp please advise  Pt wants script of Lomotil

## 2016-04-09 NOTE — Telephone Encounter (Signed)
  Follow up Call-  Call back number 04/08/2016 02/21/2014  Post procedure Call Back phone  # [phone]  Permission to leave phone message Yes Yes  Some recent data might be hidden     Patient states that she has pain in her stomach this morning. When questioned further patient relates that it is the same pain that she has every morning. Patient also said that she takes something and the pain will go away for a while. I asked the patient if she had difficulty eating or drinking and she so no that was fine. She mentioned that her throat was a little sore this morning. I told her that it wasn't unusual for the throat to be a little sore after an endoscopy. I told her that she could suck on a cough, drink fluids. Patient did not have complications from the procedure.   Patient questions:  Do you have a fever, pain , or abdominal swelling? Yes.   Pain Score  2 *  Have you tolerated food without any problems? Yes.    Have you been able to return to your normal activities? Yes.    Do you have any questions about your discharge instructions: Diet   No. Medications  No. Follow up visit  No.  Do you have questions or concerns about your Care? No.  Actions: * If pain score is 4 or above: No action needed, pain <4.

## 2016-04-10 NOTE — Telephone Encounter (Signed)
Lomotil called in to [facility] called patient to inform med sent

## 2016-04-15 ENCOUNTER — Ambulatory Visit (INDEPENDENT_AMBULATORY_CARE_PROVIDER_SITE_OTHER): Payer: Medicare Other | Admitting: Obstetrics & Gynecology

## 2016-04-15 ENCOUNTER — Encounter: Payer: Self-pay | Admitting: Obstetrics & Gynecology

## 2016-04-15 VITALS — BP 160/72 | HR 89 | Wt 133.1 lb

## 2016-04-15 DIAGNOSIS — L299 Pruritus, unspecified: Secondary | ICD-10-CM

## 2016-04-15 NOTE — Progress Notes (Signed)
History:  75 y.o.  Here today for eval of breast pruritis.   She reports that it just started.  She is also unsure of when her next PAP is due.  The following portions of the patient's history were reviewed and updated as appropriate: allergies, current medications, past family history, past medical history, past social history, past surgical history and problem list.  Review of Systems:  Pertinent items are noted in HPI.   Objective:  Physical Exam Blood pressure (!) 160/72, pulse 89, weight 133 lb 1.6 oz (60.4 kg). Gen: NAD Breast: WNL, no masses,  No rash or skin changes.  EXAM: DIGITAL SCREENING BILATERAL MAMMOGRAM WITH 3D TOMO WITH CAD  COMPARISON:  Previous exam(s).  ACR Breast Density Category b: There are scattered areas of fibroglandular density.  FINDINGS: There are no findings suspicious for malignancy. Images were processed with CAD.  IMPRESSION: No mammographic evidence of malignancy. A result letter of this screening mammogram will be mailed directly to the patient.  RECOMMENDATION: Screening mammogram in one year. (Code:SM-B-01Y)  BI-RADS CATEGORY  1: Negative.    Assessment & Plan:  Breast pruritis- wnl Elevated BP- followed by primary care   F/u 6 months repeat PAP  Emily [name], M.D., Cherlynn June

## 2016-04-17 ENCOUNTER — Encounter: Payer: Self-pay | Admitting: Gastroenterology

## 2016-04-20 ENCOUNTER — Other Ambulatory Visit (INDEPENDENT_AMBULATORY_CARE_PROVIDER_SITE_OTHER): Payer: Self-pay

## 2016-04-20 ENCOUNTER — Ambulatory Visit (INDEPENDENT_AMBULATORY_CARE_PROVIDER_SITE_OTHER): Payer: Medicare Other | Admitting: Orthopaedic Surgery

## 2016-04-20 ENCOUNTER — Encounter: Payer: Self-pay | Admitting: Obstetrics & Gynecology

## 2016-04-20 DIAGNOSIS — M5442 Lumbago with sciatica, left side: Secondary | ICD-10-CM | POA: Diagnosis not present

## 2016-04-20 DIAGNOSIS — M5441 Lumbago with sciatica, right side: Secondary | ICD-10-CM | POA: Diagnosis not present

## 2016-04-20 DIAGNOSIS — G8929 Other chronic pain: Secondary | ICD-10-CM

## 2016-04-20 NOTE — Progress Notes (Signed)
The patient is returning for follow-up after MRI of her lumbar spine. She's somewhat granulate with a walker. She has complaints of upper and low back pain she's also had knee pain and hip pain. X-rays of her knees and hips do not show any significant arthritic changes. She does have some arthritic changes in her lumbar spine. She's tried and failed all forms conservative treatment. She and relates with a rolling walker. She is not obese at all. She has tried physical therapy as well. I sent her for an MRI to get a good idea of what her back architecture looks like given her sciatica. Her biggest complaint today just mainly pain in the lowest aspect of her lumbar spine as well as some thoracic pain.  On exam she does have some slight kyphosis of her thoracic spine but otherwise no deformities activity. She has a little bit of pain in the lowest aspects of her lumbar spine around the paraspinal muscles and this may be somewhat facet joint related.  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 She was still I do consider at least facet joint injections.  We'll see if we can get her on Dr. Ernestina Patches scheduled to perform bilateral L5-S1 facet joint injections.

## 2016-05-12 ENCOUNTER — Ambulatory Visit (INDEPENDENT_AMBULATORY_CARE_PROVIDER_SITE_OTHER): Payer: Medicare Other | Admitting: Physical Medicine and Rehabilitation

## 2016-05-12 ENCOUNTER — Other Ambulatory Visit: Payer: Self-pay | Admitting: Gastroenterology

## 2016-05-12 ENCOUNTER — Encounter (INDEPENDENT_AMBULATORY_CARE_PROVIDER_SITE_OTHER): Payer: Self-pay | Admitting: Physical Medicine and Rehabilitation

## 2016-05-12 VITALS — BP 156/70 | HR 85 | Temp 98.4°F

## 2016-05-12 DIAGNOSIS — M47816 Spondylosis without myelopathy or radiculopathy, lumbar region: Secondary | ICD-10-CM

## 2016-05-12 MED ORDER — METHYLPREDNISOLONE ACETATE 80 MG/ML IJ SUSP
80.0000 mg | Freq: Once | INTRAMUSCULAR | Status: AC
  Administered 2016-05-12: 80 mg

## 2016-05-12 MED ORDER — LIDOCAINE HCL (PF) 1 % IJ SOLN: 0.3300 mL | Freq: Once | INTRAMUSCULAR | Status: DC

## 2016-05-12 NOTE — Procedures (Signed)
Lumbar Facet Joint Intra-Articular Injection(s) with Fluoroscopic Guidance  Patient: Emily [name]      Date of Birth: [birth year] MRN: [id number] PCP: Philis Fendt, MD      Visit Date: 05/12/2016   Universal Protocol:    Date/Time: 01/09/182:04 PM  Consent Given By: the patient  Position: PRONE   Additional Comments: Vital signs were monitored before and after the procedure. Patient was prepped and draped in the usual sterile fashion. The correct patient, procedure, and site was verified.   Injection Procedure Details:  Procedure Site One Meds Administered:  Meds ordered this encounter  Medications  . lidocaine (PF) (XYLOCAINE) 1 % injection 0.3 mL  . methylPREDNISolone acetate (DEPO-MEDROL) injection 80 mg     Laterality: Bilateral  Location/Site:  L5-S1  Needle size: 22 guage  Needle type: Spinal  Needle Placement: Articular  Findings:  -Contrast Used: 1 mL iohexol 180 mg iodine/mL   -Comments: Excellent flow of contrast producing a partial arthrogram.  Procedure Details: The fluoroscope beam is vertically oriented in AP, and the inferior recess is visualized beneath the lower pole of the inferior apophyseal process, which represents the target point for needle insertion. When direct visualization is difficult the target point is located at the medial projection of the vertebral pedicle. The region overlying each aforementioned target is locally anesthetized with a 1 to 2 ml. volume of 1% Lidocaine without Epinephrine.   The spinal needle was inserted into each of the above mentioned facet joints using biplanar fluoroscopic guidance. A 0.25 to 0.5 ml. volume of Isovue-250 was injected and a partial facet joint arthrogram was obtained. A single spot film was obtained of the resulting arthrogram.    One to 1.25 ml of the steroid/anesthetic solution was then injected into each of the facet joints noted above.   Additional Comments:  The patient tolerated the  procedure well Dressing: Band-Aid    Post-procedure details: Patient was observed during the procedure. Post-procedure instructions were reviewed.  Patient left the clinic in stable condition.
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
## 2016-05-12 NOTE — Patient Instructions (Signed)

## 2016-05-13 ENCOUNTER — Encounter: Payer: Self-pay | Admitting: Podiatry

## 2016-05-13 ENCOUNTER — Ambulatory Visit (INDEPENDENT_AMBULATORY_CARE_PROVIDER_SITE_OTHER): Payer: Medicare Other | Admitting: Podiatry

## 2016-05-13 VITALS — BP 156/80 | HR 89 | Resp 18

## 2016-05-13 DIAGNOSIS — B351 Tinea unguium: Secondary | ICD-10-CM

## 2016-05-13 DIAGNOSIS — M79676 Pain in unspecified toe(s): Secondary | ICD-10-CM

## 2016-05-13 NOTE — Patient Instructions (Signed)
Apply Vaseline to the callused nail groove on the left big toenail daily and cover with a Band-Aid until comfortable  Diabetes and Foot Care Diabetes may cause you to have problems because of poor blood supply (circulation) to your feet and legs. This may cause the skin on your feet to become thinner, break easier, and heal more slowly. Your skin may become dry, and the skin may peel and crack. You may also have nerve damage in your legs and feet causing decreased feeling in them. You may not notice minor injuries to your feet that could lead to infections or more serious problems. Taking care of your feet is one of the most important things you can do for yourself. Follow these instructions at home:  Wear shoes at all times, even in the house. Do not go barefoot. Bare feet are easily injured.  Check your feet daily for blisters, cuts, and redness. If you cannot see the bottom of your feet, use a mirror or ask someone for help.  Wash your feet with warm water (do not use hot water) and mild soap. Then pat your feet and the areas between your toes until they are completely dry. Do not soak your feet as this can dry your skin.  Apply a moisturizing lotion or petroleum jelly (that does not contain alcohol and is unscented) to the skin on your feet and to dry, brittle toenails. Do not apply lotion between your toes.  Trim your toenails straight across. Do not dig under them or around the cuticle. File the edges of your nails with an emery board or nail file.  Do not cut corns or calluses or try to remove them with medicine.  Wear clean socks or stockings every day. Make sure they are not too tight. Do not wear knee-high stockings since they may decrease blood flow to your legs.  Wear shoes that fit properly and have enough cushioning. To break in new shoes, wear them for just a few hours a day. This prevents you from injuring your feet. Always look in your shoes before you put them on to be sure  there are no objects inside.  Do not cross your legs. This may decrease the blood flow to your feet.  If you find a minor scrape, cut, or break in the skin on your feet, keep it and the skin around it clean and dry. These areas may be cleansed with mild soap and water. Do not cleanse the area with peroxide, alcohol, or iodine.  When you remove an adhesive bandage, be sure not to damage the skin around it.  If you have a wound, look at it several times a day to make sure it is healing.  Do not use heating pads or hot water bottles. They may burn your skin. If you have lost feeling in your feet or legs, you may not know it is happening until it is too late.  Make sure your health care provider performs a complete foot exam at least annually or more often if you have foot problems. Report any cuts, sores, or bruises to your health care provider immediately. Contact a health care provider if:  You have an injury that is not healing.  You have cuts or breaks in the skin.  You have an ingrown nail.  You notice redness on your legs or feet.  You feel burning or tingling in your legs or feet.  You have pain or cramps in your legs and feet.  Your legs or feet are numb.  Your feet always feel cold. Get help right away if:  There is increasing redness, swelling, or pain in or around a wound.  There is a red line that goes up your leg.  Pus is coming from a wound.  You develop a fever or as directed by your health care provider.  You notice a bad smell coming from an ulcer or wound. This information is not intended to replace advice given to you by your health care provider. Make sure you discuss any questions you have with your health care provider. Document Released: 04/17/2000 Document Revised: 09/26/2015 Document Reviewed: 09/27/2012 Elsevier Interactive Patient Education  2017 [employer].

## 2016-05-13 NOTE — Progress Notes (Signed)
Patient ID: Emily [name], female   DOB: 08-31-1940, 76 y.o.   MRN: [id number]   Subjective: This patient presents today for scheduled visit complaining of uncomfortable toenails with walking wearing shoes and requests toenail debridement   Objective: Orientated 3 DP and PT pulses 2/4 bilaterally Capillary reflex immediate bilaterally Sensation to 10 g monofilament wire intact 3/5 right 4/5 left Vibratory sensation reactive bilaterally Ankle reflex equal and reactive bilaterally No open skin lesions bilaterally Dry scaling skin heals without fissures The toenails are elongated, hypertrophic, deformed, discolored and tender to direct palpation 6-10 The medial left hallux nail groove is callused and tender Pes planus bilaterally HAV bilaterally  Assessment: Diabetic with satisfactory neurovascular status Symptomatic onychomycoses 6-10 Callus medial nail groove left hallux Plan: Debridement toenails 6-10 mechanically electrically without any bleeding Recommended Vaseline to callused nail groove and left hallux daily until comfortable    Reappoint 3 months

## 2016-05-15 IMAGING — CR DG UGI W/ SMALL BOWEL HIGH DENSITY
5 series · 6 of 6 positions shown · non-contrast
Comparison: 06/22/2014

CLINICAL DATA: Upper abdominal pain. Prior fundoplication some
prior subtotal gastrectomy. Assessment for small bowel obstruction.

EXAM:
UPPER GI SERIES WITH SMALL BOWEL FOLLOW-THROUGH
FLUOROSCOPY TIME:  Radiation Exposure Index (as provided by the
fluoroscopic device):
If the device does not provide the exposure index:
Fluoroscopy Time (in minutes and seconds):  2 minute 35 seconds
Number of Acquired Images:  25
TECHNIQUE: Combined double contrast and single contrast upper GI series using
thin barium. Subsequently, serial images of the small bowel were
obtained including spot views of the terminal ileum.

[abdomen kub (1 of 5)]
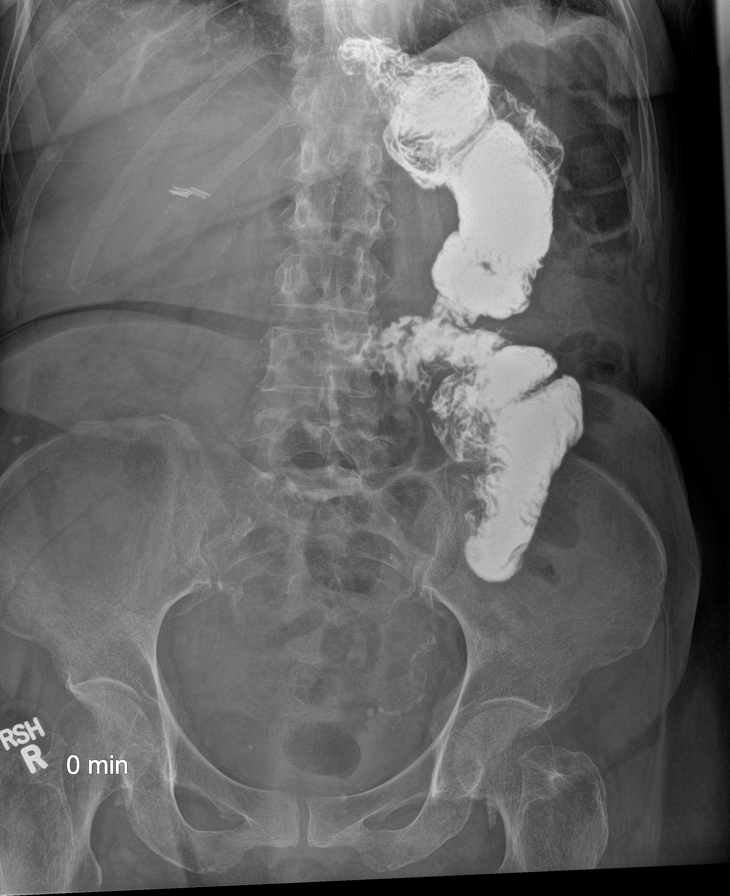

[Series 2: abdomen kub · 0.14mm/px · 2 of 2 slices shown (2 of 5)]
[im 1/2]
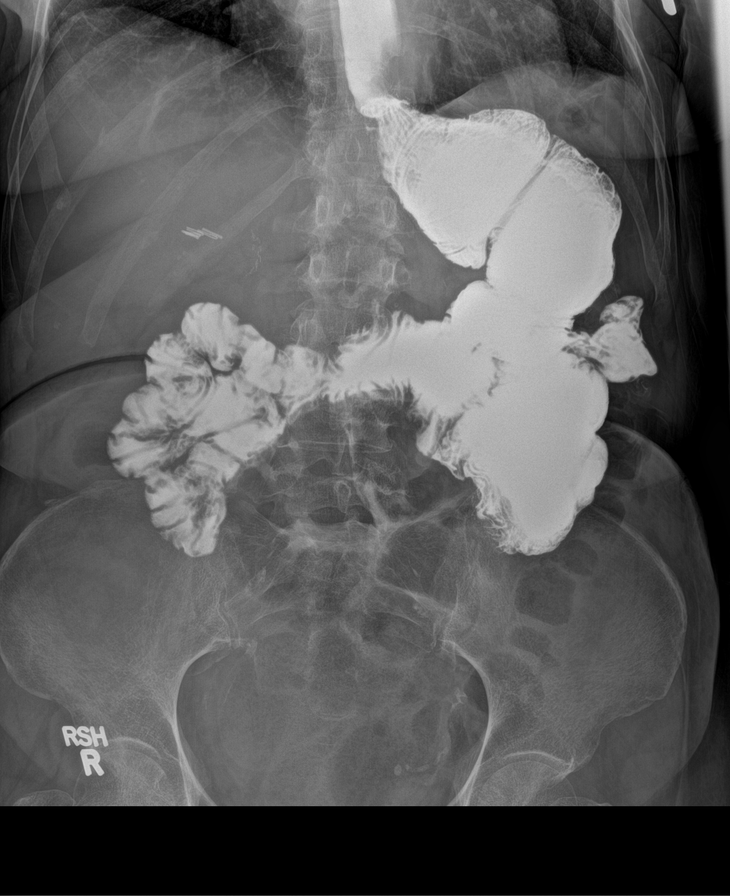
[im 2/2]
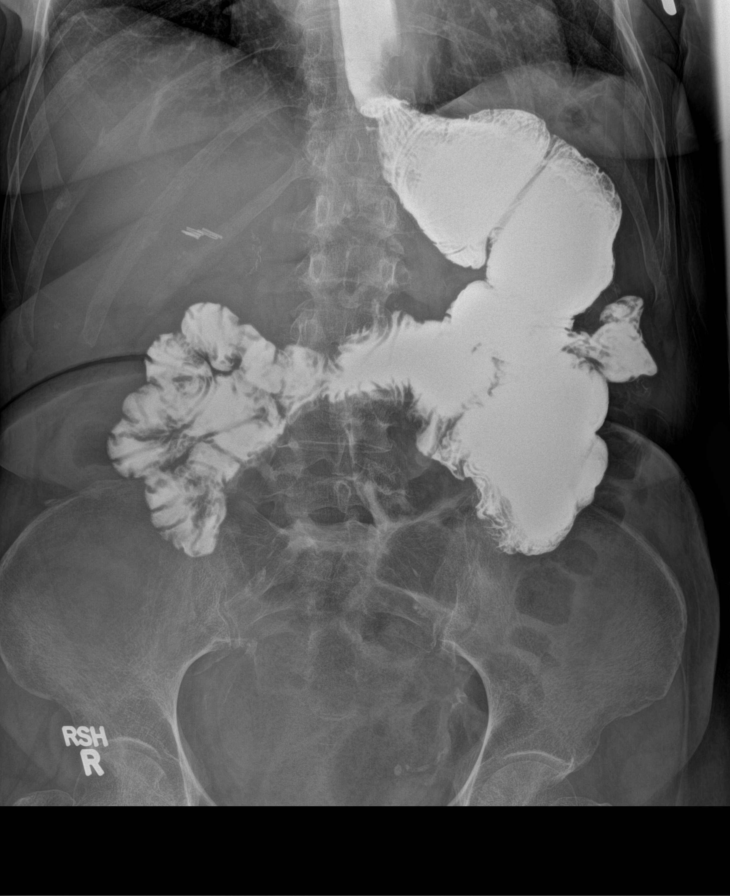

[abdomen kub (3 of 5)]
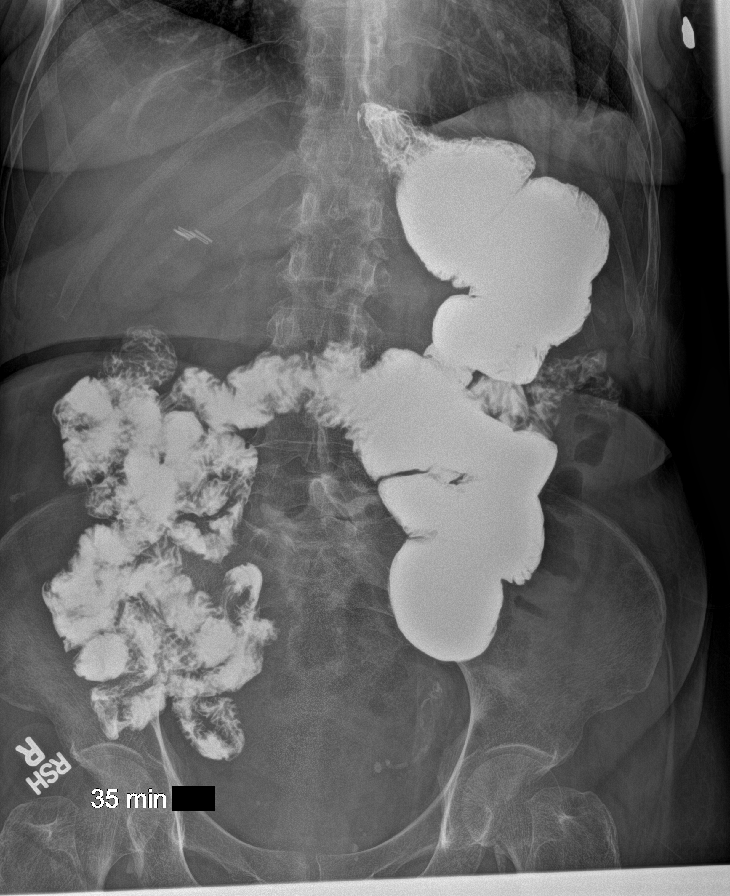

[abdomen kub (4 of 5)]
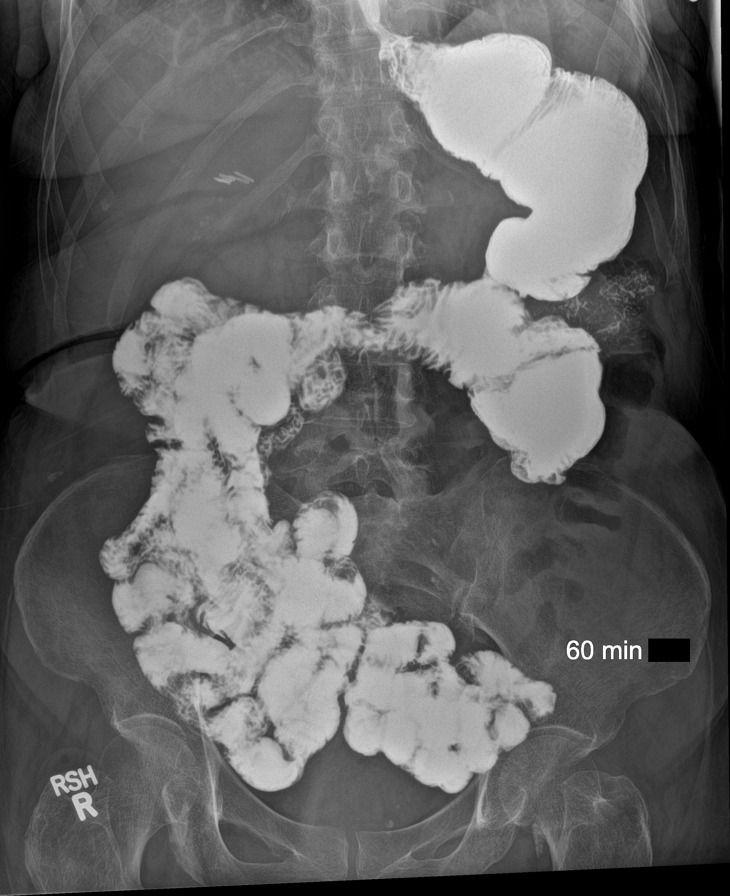

[abdomen kub (5 of 5)]
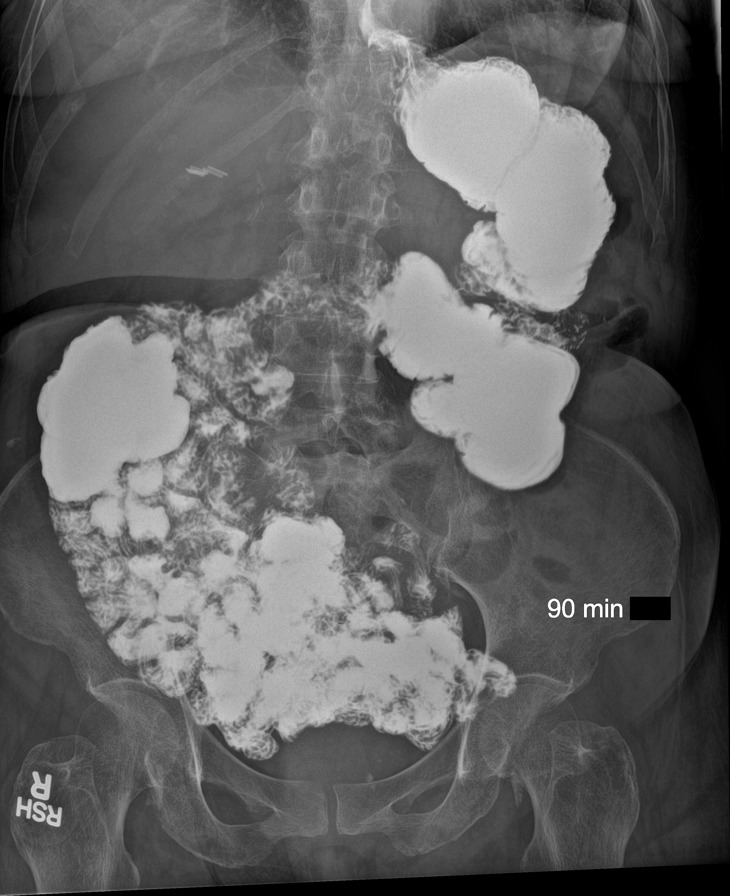

[6 of 6 positions shown; findings below may reference images not displayed]

FINDINGS: Initial KUB demonstrates bony demineralization and postoperative
clips in the right upper quadrant and left upper quadrant. Vascular
calcifications noted.

Pharyngeal phase of swallowing within normal limits.

Small type 1 hiatal hernia. Primary peristaltic waves of the
esophagus were disrupted on [DATE] swallows. The esophagus
appears dilated and there were numerous incidence of
gastroesophageal reflux up to the cervical esophagus during today's
exam. No stricture identified. I did not administer a barium pill
because there was no narrowing to further investigate. There is a
minimal outpouching at the gastroesophageal junction on series 29,
probably postoperative and less likely to be an ulcer.

Small residual amount of stomach attached to mildly dilated proximal
jejunum. However, there is no obstruction in this vicinity either at
or distal to the gastrojejunostomy.

On the small bowel follow-through exam, the small bowel drape
surround a central abdominal space-occupying structures thought to
represent stool filled rectum and sigmoid.

Terminal ileum unremarkable. Several filling defects in the small
bowel were shown to be gas bubbles based on mobility and
compressibility. Small well folds and caliber normal.
IMPRESSION: 1. Multiple episodes of gastroesophageal reflux into the dilated
esophagus. These occurred throughout the exam.
2. Mildly dilated esophagus.  Small type 1 hiatal hernia.
3. Subtotal gastrectomy with gastrojejunostomy. The proximal jejunal
loops are slightly prominent but this may be physiologic. Minimal
outpouching along the gastroesophageal junction may reflect residua
from prior fundoplication (less likely to be an ulcer based on
morphology), but no intact wrap is currently seen.
4. Mild nonspecific esophageal dysmotility disorder.

## 2016-05-30 ENCOUNTER — Telehealth: Payer: Self-pay | Admitting: Internal Medicine

## 2016-05-30 NOTE — Telephone Encounter (Signed)
The patient called this afternoon. Patient Dr. [name]. Chart reviewed. Complicated GI history with multiple upper GI surgeries including subtotal gastrectomy. Recent EGD as outlined. Recent call to office with c/o diarrhea for which Lomotil prescribed. Currently describes 1 day history of discomfort under the breasts and rib cage bilaterally that is brought on by standing up and moving. Okay with sitting. No other particular symptoms. I told her that I have no idea what is causing her symptoms and cannot provide medical advice. I did advise that she could go to the emergency room for evaluation, if this is at all concerning to her. She understood and agreed. I also told her that it was not clear to me that this problem is gastrointestinal nature and I would recommend she contact her primary care provider, who would be more familiar with her medical history, as well for possible advice.

## 2016-06-01 ENCOUNTER — Other Ambulatory Visit (INDEPENDENT_AMBULATORY_CARE_PROVIDER_SITE_OTHER): Payer: Self-pay

## 2016-06-01 ENCOUNTER — Ambulatory Visit (INDEPENDENT_AMBULATORY_CARE_PROVIDER_SITE_OTHER): Payer: Medicare Other | Admitting: Orthopaedic Surgery

## 2016-06-01 DIAGNOSIS — M25562 Pain in left knee: Principal | ICD-10-CM

## 2016-06-01 DIAGNOSIS — M25561 Pain in right knee: Secondary | ICD-10-CM

## 2016-06-01 DIAGNOSIS — G8929 Other chronic pain: Secondary | ICD-10-CM

## 2016-06-01 NOTE — Progress Notes (Signed)
The patient is continue to follow-up with me for her low back as well as her knees. Her biggest complaint today is her left knee. She says is always giving out on her and feels like it shifting. We have had plain films of her knees that showed surprisingly no significant arthritic changes giving her age. We've tried injections in her knees including hyaluronic acid and steroid injections and none of this is helped but only short-term. The injections that Dr. Ernestina Patches provided in her lumbar spine did help some with her back pain. This apparently facet joint injections.  On examination of her knees today there is no gross deformities of either knee. Return to once: From a ligamentous standpoint due to the pain she is having there is no effusion of either knee either. She's got good range of motion of both knees. She him most with a walker.  This point I will also what else to try other than obtaining an MRI of her left knee to rule out a ligamentous or cartilage issue such as a meniscal tear given the fact that she says her knee possibly gives out on her and almost causes her to fall. We will see her back after the MRI of her left knee.

## 2016-06-09 ENCOUNTER — Telehealth: Payer: Self-pay | Admitting: Gastroenterology

## 2016-06-09 NOTE — Telephone Encounter (Signed)
Patient states she has had a worsening of her abdominal pain in the past weeks. She spoke with one of our docotrs after office hours and was encouraged to go to ER or contact primary. She has not done any of these things. She reports she hurts from under the breast area down. Tempary relief with a bowel movement, but the pain will return. At that point she will take a "medicine" such as gabapentin, dicyclomine or Zanaflex. The patient then states she will be most comfortable if she is seen again for this. Appointment set for the patient to be re-evaluated.

## 2016-06-10 ENCOUNTER — Ambulatory Visit
Admission: RE | Admit: 2016-06-10 | Discharge: 2016-06-10 | Disposition: A | Discharge status: Home / Self Care | Payer: Medicare Other | Source: Ambulatory Visit | Attending: Orthopaedic Surgery | Admitting: Orthopaedic Surgery

## 2016-06-10 DIAGNOSIS — G8929 Other chronic pain: Secondary | ICD-10-CM

## 2016-06-10 DIAGNOSIS — M25562 Pain in left knee: Principal | ICD-10-CM

## 2016-06-12 ENCOUNTER — Ambulatory Visit (INDEPENDENT_AMBULATORY_CARE_PROVIDER_SITE_OTHER): Payer: Medicare Other | Admitting: Gastroenterology

## 2016-06-12 ENCOUNTER — Encounter: Payer: Self-pay | Admitting: Gastroenterology

## 2016-06-12 VITALS — BP 138/64 | HR 96 | Ht 61.81 in | Wt 132.5 lb

## 2016-06-12 DIAGNOSIS — K219 Gastro-esophageal reflux disease without esophagitis: Secondary | ICD-10-CM

## 2016-06-12 DIAGNOSIS — R110 Nausea: Secondary | ICD-10-CM | POA: Diagnosis not present

## 2016-06-12 DIAGNOSIS — R1010 Upper abdominal pain, unspecified: Secondary | ICD-10-CM

## 2016-06-12 DIAGNOSIS — M791 Myalgia: Secondary | ICD-10-CM

## 2016-06-12 DIAGNOSIS — K589 Irritable bowel syndrome without diarrhea: Secondary | ICD-10-CM | POA: Diagnosis not present

## 2016-06-12 DIAGNOSIS — M7918 Myalgia, other site: Secondary | ICD-10-CM

## 2016-06-12 NOTE — Patient Instructions (Signed)
If you are age 76 or older, your body mass index should be between 23-30. Your Body mass index is 24.38 kg/m. If this is out of the aforementioned range listed, please consider follow up with your Primary Care Provider.  If you are age 38 or younger, your body mass index should be between 19-25. Your Body mass index is 24.38 kg/m. If this is out of the aformentioned range listed, please consider follow up with your Primary Care Provider.   Take FDGuard 1-2 capsules three times daily.   Use Bengay rub cream two times daily as needed.  Follow up as needed.  Thank you for choosing me and [facility].  Dr. Pat Kocher
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
## 2016-06-17 ENCOUNTER — Ambulatory Visit (INDEPENDENT_AMBULATORY_CARE_PROVIDER_SITE_OTHER): Payer: Medicare Other | Admitting: Orthopaedic Surgery

## 2016-06-17 DIAGNOSIS — G8929 Other chronic pain: Secondary | ICD-10-CM | POA: Diagnosis not present

## 2016-06-17 DIAGNOSIS — M25562 Pain in left knee: Secondary | ICD-10-CM | POA: Diagnosis not present

## 2016-06-17 MED ORDER — LIDOCAINE HCL 1 % IJ SOLN
3.0000 mL | INTRAMUSCULAR | Status: AC | PRN
  Administered 2016-06-17: 3 mL

## 2016-06-17 MED ORDER — METHYLPREDNISOLONE ACETATE 40 MG/ML IJ SUSP
40.0000 mg | INTRAMUSCULAR | Status: AC | PRN
  Administered 2016-06-17: 40 mg via INTRA_ARTICULAR
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
## 2016-06-19 ENCOUNTER — Emergency Department (HOSPITAL_COMMUNITY)
Admission: EM | Admit: 2016-06-19 | Discharge: 2016-06-19 | Disposition: A | Discharge status: Home / Self Care | Payer: Medicare Other | Attending: Emergency Medicine | Admitting: Emergency Medicine

## 2016-06-19 ENCOUNTER — Encounter (HOSPITAL_COMMUNITY): Payer: Self-pay

## 2016-06-19 ENCOUNTER — Telehealth: Payer: Self-pay | Admitting: Cardiology

## 2016-06-19 DIAGNOSIS — Z79899 Other long term (current) drug therapy: Secondary | ICD-10-CM | POA: Insufficient documentation

## 2016-06-19 DIAGNOSIS — F17210 Nicotine dependence, cigarettes, uncomplicated: Secondary | ICD-10-CM | POA: Diagnosis not present

## 2016-06-19 DIAGNOSIS — R109 Unspecified abdominal pain: Secondary | ICD-10-CM

## 2016-06-19 DIAGNOSIS — I10 Essential (primary) hypertension: Secondary | ICD-10-CM | POA: Insufficient documentation

## 2016-06-19 DIAGNOSIS — Z7982 Long term (current) use of aspirin: Secondary | ICD-10-CM | POA: Insufficient documentation

## 2016-06-19 DIAGNOSIS — E119 Type 2 diabetes mellitus without complications: Secondary | ICD-10-CM | POA: Insufficient documentation

## 2016-06-19 DIAGNOSIS — R1084 Generalized abdominal pain: Secondary | ICD-10-CM | POA: Diagnosis not present

## 2016-06-19 DIAGNOSIS — R079 Chest pain, unspecified: Secondary | ICD-10-CM | POA: Insufficient documentation

## 2016-06-19 LAB — CBC WITH DIFFERENTIAL/PLATELET
Basophils Absolute: 0 10*3/uL (ref 0.0–0.1)
Basophils Relative: 0 %
Eosinophils Absolute: 0 10*3/uL (ref 0.0–0.7)
Eosinophils Relative: 0 %
HCT: 34.9 % — ABNORMAL LOW (ref 36.0–46.0)
Hemoglobin: 11 g/dL — ABNORMAL LOW (ref 12.0–15.0)
Lymphocytes Relative: 34 %
Lymphs Abs: 3.3 10*3/uL (ref 0.7–4.0)
MCH: 23.9 pg — ABNORMAL LOW (ref 26.0–34.0)
MCHC: 31.5 g/dL (ref 30.0–36.0)
MCV: 75.7 fL — ABNORMAL LOW (ref 78.0–100.0)
Monocytes Absolute: 0.5 10*3/uL (ref 0.1–1.0)
Monocytes Relative: 5 %
Neutro Abs: 5.9 10*3/uL (ref 1.7–7.7)
Neutrophils Relative %: 61 %
Platelets: 434 10*3/uL — ABNORMAL HIGH (ref 150–400)
RBC: 4.61 MIL/uL (ref 3.87–5.11)
RDW: 17.7 % — ABNORMAL HIGH (ref 11.5–15.5)
WBC: 9.7 10*3/uL (ref 4.0–10.5)

## 2016-06-19 LAB — I-STAT TROPONIN, ED
Troponin i, poc: 0 ng/mL (ref 0.00–0.08)
Troponin i, poc: 0 ng/mL (ref 0.00–0.08)
Troponin i, poc: 0 ng/mL (ref 0.00–0.08)

## 2016-06-19 LAB — COMPREHENSIVE METABOLIC PANEL
ALT: 35 U/L (ref 14–54)
AST: 31 U/L (ref 15–41)
Albumin: 3.7 g/dL (ref 3.5–5.0)
Alkaline Phosphatase: 60 U/L (ref 38–126)
Anion gap: 8 (ref 5–15)
BUN: 10 mg/dL (ref 6–20)
CO2: 23 mmol/L (ref 22–32)
Calcium: 8.8 mg/dL — ABNORMAL LOW (ref 8.9–10.3)
Chloride: 110 mmol/L (ref 101–111)
Creatinine, Ser: 0.71 mg/dL (ref 0.44–1.00)
GFR calc Af Amer: >60 mL/min (ref >60)
GFR calc non Af Amer: >60 mL/min (ref >60)
Glucose, Bld: 78 mg/dL (ref 65–99)
Potassium: 4 mmol/L (ref 3.5–5.1)
Sodium: 141 mmol/L (ref 135–145)
Total Bilirubin: 0.7 mg/dL (ref 0.3–1.2)
Total Protein: 6 g/dL — ABNORMAL LOW (ref 6.5–8.1)

## 2016-06-19 LAB — I-STAT CHEM 8, ED
BUN: 11 mg/dL (ref 6–20)
Calcium, Ion: 1.13 mmol/L — ABNORMAL LOW (ref 1.15–1.40)
Chloride: 109 mmol/L (ref 101–111)
Creatinine, Ser: 0.7 mg/dL (ref 0.44–1.00)
Glucose, Bld: 85 mg/dL (ref 65–99)
HCT: 38 % (ref 36.0–46.0)
Hemoglobin: 12.9 g/dL (ref 12.0–15.0)
Potassium: 3.9 mmol/L (ref 3.5–5.1)
Sodium: 144 mmol/L (ref 135–145)
TCO2: 26 mmol/L (ref 0–100)

## 2016-06-19 LAB — LIPASE, BLOOD: Lipase: 38 U/L (ref 11–51)

## 2016-06-19 LAB — I-STAT CG4 LACTIC ACID, ED: Lactic Acid, Venous: 1.11 mmol/L (ref 0.5–1.9)

## 2016-06-19 MED ORDER — SODIUM CHLORIDE 0.9 % IV BOLUS (SEPSIS)
1000.0000 mL | Freq: Once | INTRAVENOUS | Status: AC
  Administered 2016-06-19: 1000 mL via INTRAVENOUS

## 2016-06-19 NOTE — Telephone Encounter (Signed)
Pt c/o of Chest Pain: STAT if CP now or developed within 24 hours  1. Are you having CP right now? yes  2. Are you experiencing any other symptoms (ex. SOB, nausea, vomiting, sweating)? sob  3. How long have you been experiencing CP? 06-19-2016 4. Is your CP continuous or coming and going? continous 5. Have you taken Nitroglycerin? no ?

## 2016-06-19 NOTE — ED Provider Notes (Signed)
Please see previous physicians note regarding patient's presenting history and physical, initial ED course, and associated medical decision making.  Repeat troponin is normal. She is well appearing. Stable vitals. Currently without complaints. Stable for discharge home.    Forde Dandy, MD 06/19/16 [phone]

## 2016-06-19 NOTE — Telephone Encounter (Signed)
Spoke to patient Patient states she just woke up. Having chest pain radiating mid sternum and round an under both breast. No nausea ,no shortness of breath. No abdominal pain  RN asked patient NTG used . Patient states she has not  Have NTG - NEVER ORDER. Reviewed patient chart- last exercise tolerance test - in 2017 - negative RN instructed to hang up and call EMS , go to ER. Patient verbalized understanding.

## 2016-06-19 NOTE — ED Notes (Signed)
2 RN's attempted IV start.

## 2016-06-19 NOTE — Discharge Instructions (Signed)
Please follow up closely with your primary care provider. Return without fail for worsening symptoms, including worsening pain, passing out, difficulty breathing, confusion, or any other symptoms concerning to you
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
## 2016-06-19 NOTE — ED Triage Notes (Signed)
Pt brought in by EMS due to having epigastric pain that radiated up into her left chest and throat. Pt describes pain as sharp in nature. Pt a&ox4.

## 2016-06-22 LAB — I-STAT CHEM 8, ED
BUN: 15 mg/dL (ref 6–20)
Calcium, Ion: 1.04 mmol/L — ABNORMAL LOW (ref 1.15–1.40)
Chloride: 109 mmol/L (ref 101–111)
Creatinine, Ser: 0.8 mg/dL (ref 0.44–1.00)
Glucose, Bld: 86 mg/dL (ref 65–99)
HCT: 36 % (ref 36.0–46.0)
Hemoglobin: 12.2 g/dL (ref 12.0–15.0)
Potassium: 8 mmol/L (ref 3.5–5.1)
Sodium: 139 mmol/L (ref 135–145)
TCO2: 29 mmol/L (ref 0–100)

## 2016-06-25 ENCOUNTER — Encounter: Payer: Self-pay | Admitting: Physician Assistant

## 2016-06-25 ENCOUNTER — Other Ambulatory Visit: Payer: Self-pay | Admitting: Internal Medicine

## 2016-06-25 ENCOUNTER — Ambulatory Visit (INDEPENDENT_AMBULATORY_CARE_PROVIDER_SITE_OTHER): Payer: Medicare Other | Admitting: Physician Assistant

## 2016-06-25 VITALS — BP 150/72 | HR 72 | Ht 61.8 in | Wt 132.1 lb

## 2016-06-25 DIAGNOSIS — R1013 Epigastric pain: Secondary | ICD-10-CM

## 2016-06-25 DIAGNOSIS — Z1231 Encounter for screening mammogram for malignant neoplasm of breast: Secondary | ICD-10-CM

## 2016-06-25 DIAGNOSIS — R1031 Right lower quadrant pain: Secondary | ICD-10-CM | POA: Diagnosis not present

## 2016-06-25 DIAGNOSIS — M940 Chondrocostal junction syndrome [Tietze]: Secondary | ICD-10-CM

## 2016-06-25 DIAGNOSIS — R1032 Left lower quadrant pain: Secondary | ICD-10-CM

## 2016-06-25 MED ORDER — AMBULATORY NON FORMULARY MEDICATION: 5.0000 mL | 1 refills | Status: DC | PRN

## 2016-06-25 NOTE — Patient Instructions (Addendum)
If you are age 76 or older, your body mass index should be between 23-30. Your Body mass index is 24.32 kg/m. If this is out of the aforementioned range listed, please consider follow up with your Primary Care Provider.  If you are age 66 or younger, your body mass index should be between 19-25. Your Body mass index is 24.32 kg/m. If this is out of the aformentioned range listed, please consider follow up with your Primary Care Provider.   We have sent the following medications to your pharmacy for you to pick up at your convenience: Dicyclomine GI cocktail Please purchase the following medications over the counter and take as directed: Ben-gay or other over surgical scar left side.  FD Donald Prose - 2 caps three times daily.  Follow up in 6-8 weeks with Dr. Pat Kocher
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
## 2016-06-29 ENCOUNTER — Telehealth: Payer: Self-pay

## 2016-06-29 NOTE — Telephone Encounter (Signed)
Patient states that pharmacy has not received updated information on the GI cocktail. Pt would like a call back to know what is going on

## 2016-06-29 NOTE — Telephone Encounter (Signed)
Per [facility], they do have correct information now but are out of one of the GI Cocktail ingredients and will have it in tomorrow and rx will be ready then.

## 2016-06-29 NOTE — Telephone Encounter (Signed)
The pt prescription was corrected to 270 ml maalox, 90 ml dicyclomine, 90 ml 2% lidocaine pt aware

## 2016-06-29 NOTE — Progress Notes (Signed)
Reviewed and agree with documentation and assessment and plan. K. Veena Nandigam , MD   

## 2016-06-30 NOTE — Telephone Encounter (Signed)
Pharmacy has been given the correct information.  The pt will call the pharmacy to inquire

## 2016-07-10 ENCOUNTER — Ambulatory Visit
Admission: RE | Admit: 2016-07-10 | Discharge: 2016-07-10 | Disposition: A | Discharge status: Home / Self Care | Payer: Medicare Other | Source: Ambulatory Visit | Attending: Internal Medicine | Admitting: Internal Medicine

## 2016-07-10 DIAGNOSIS — Z1231 Encounter for screening mammogram for malignant neoplasm of breast: Secondary | ICD-10-CM

## 2016-07-15 ENCOUNTER — Ambulatory Visit (INDEPENDENT_AMBULATORY_CARE_PROVIDER_SITE_OTHER): Payer: Medicare Other | Admitting: Orthopaedic Surgery

## 2016-07-15 DIAGNOSIS — G8929 Other chronic pain: Secondary | ICD-10-CM

## 2016-07-15 DIAGNOSIS — M25562 Pain in left knee: Secondary | ICD-10-CM

## 2016-07-15 IMAGING — CT CT ABD-PELV W/O CM
2 of 4 series · 16 of 46 positions shown, 18 images · non-contrast
Comparison: CT scan of October 20, 2013.

CLINICAL DATA: Generalized abdominal pain.

EXAM:
CT ABDOMEN AND PELVIS WITHOUT CONTRAST
TECHNIQUE: Multidetector CT imaging of the abdomen and pelvis was performed
following the standard protocol without IV contrast.

[Series 2: abd/pel w/o · axial · non-contrast · 0.74mm/px · z∈[+1319,+1704]mm · 13 of 85 slices shown, 15 images]
[im 4/85  soft-tissue]
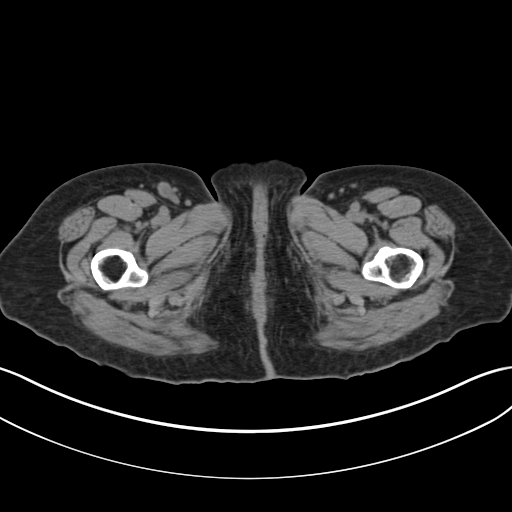
[im 4/85  bone]
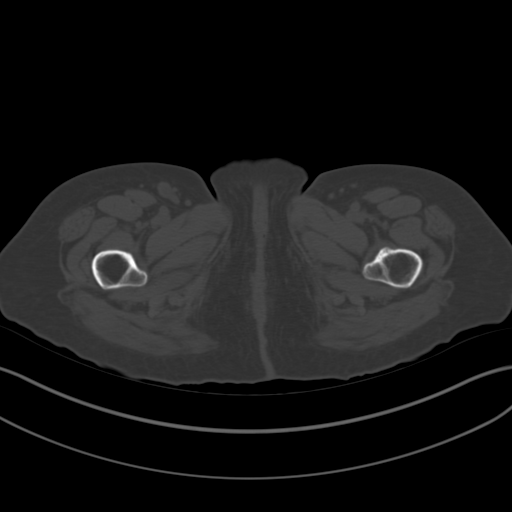
[im 11/85  soft-tissue]
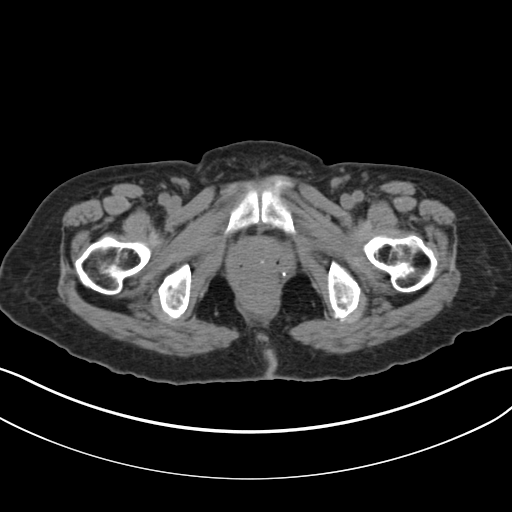
[im 18/85  soft-tissue]
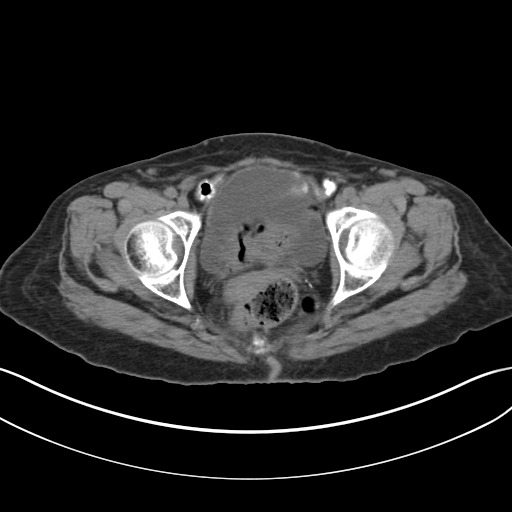
[im 25/85  soft-tissue]
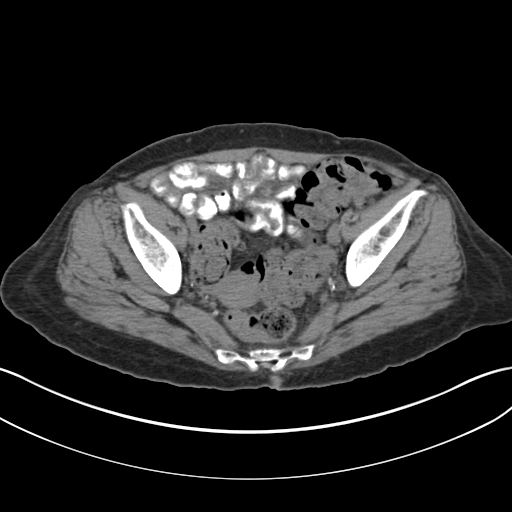
[im 29/85  soft-tissue]
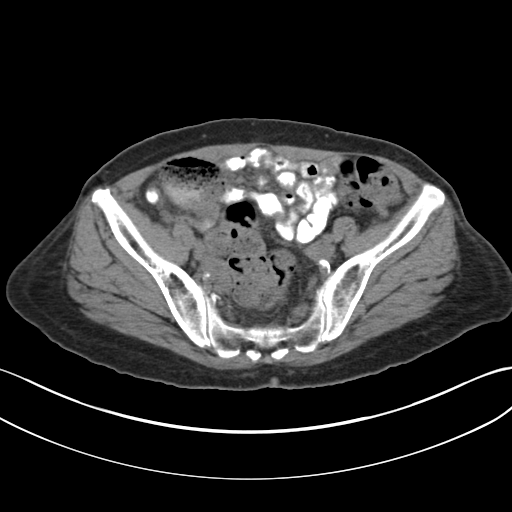
[im 36/85  soft-tissue]
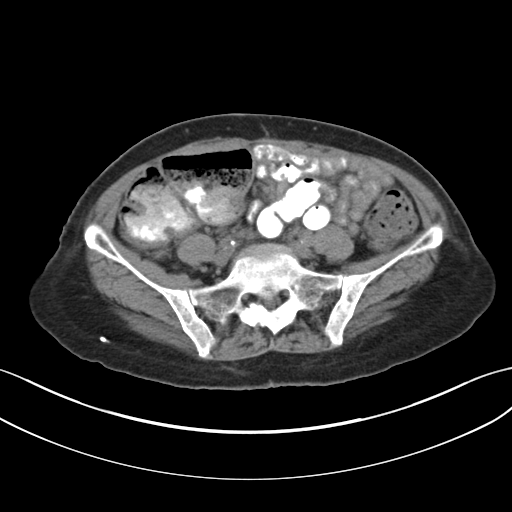
[im 43/85  soft-tissue]
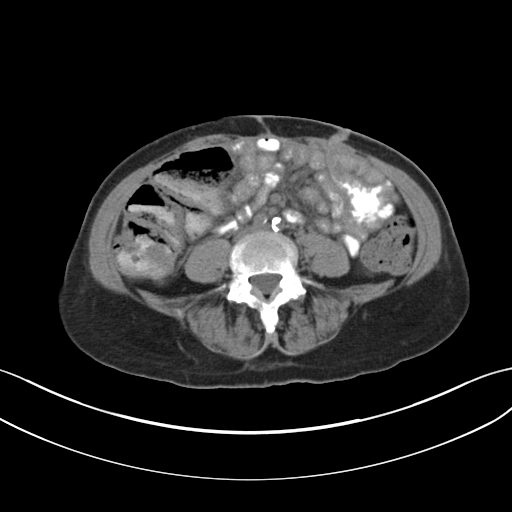
[im 50/85  soft-tissue]
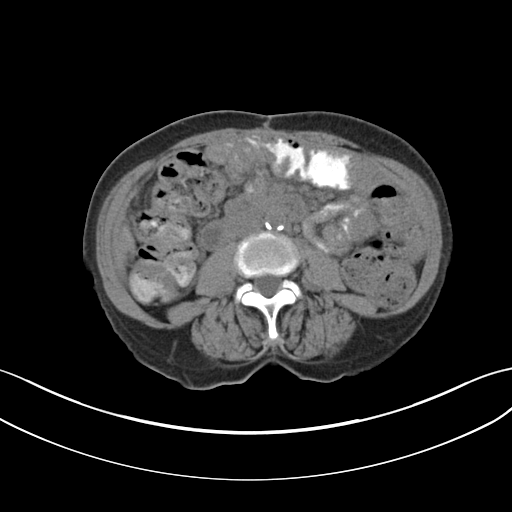
[im 57/85  soft-tissue]
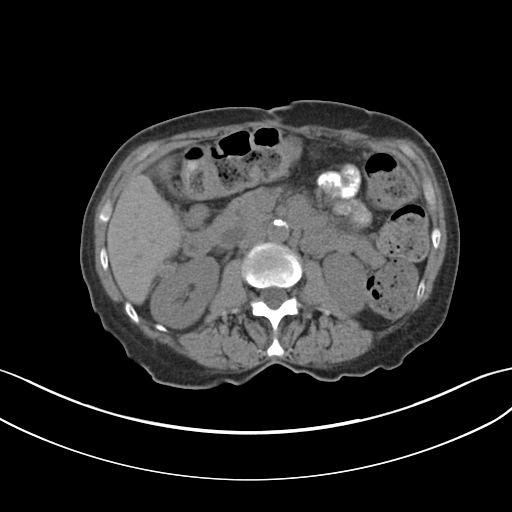
[im 57/85  bone]
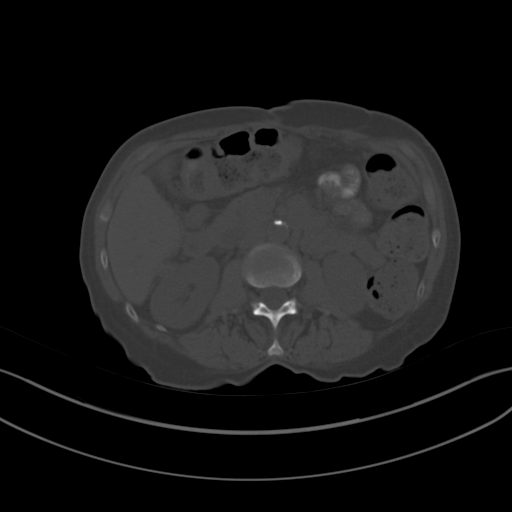
[im 60/85  soft-tissue]
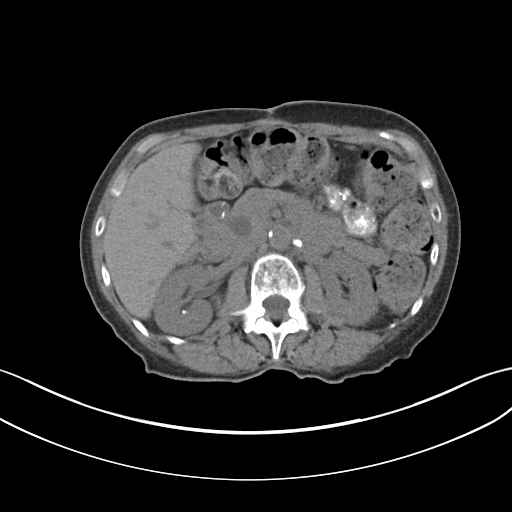
[im 67/85  soft-tissue]
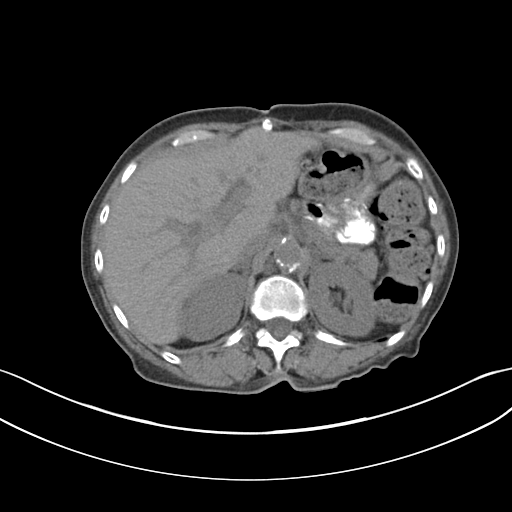
[im 74/85  soft-tissue]
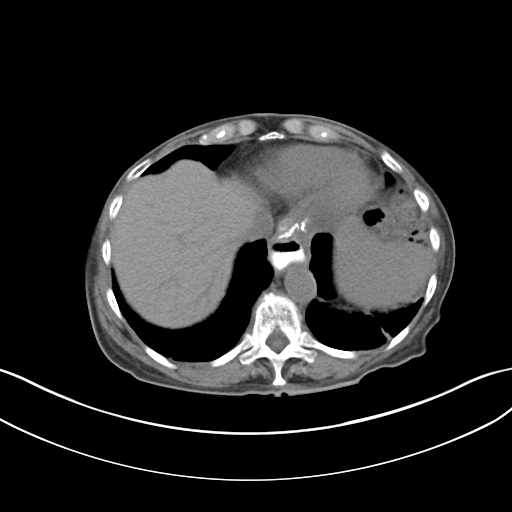
[im 81/85  soft-tissue]
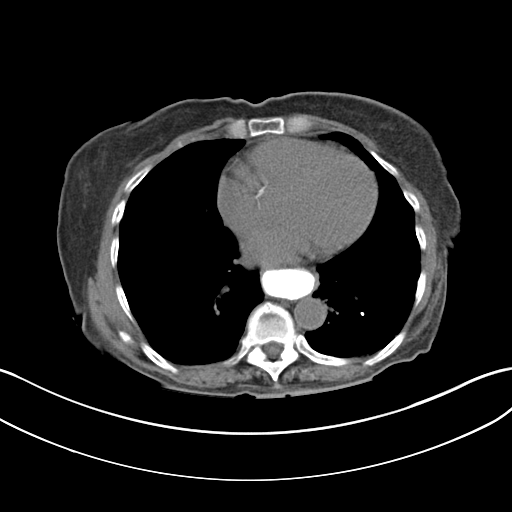

[Series 3: coronal · coronal · 0.72mm/px · 3 of 104 slices shown]
[im 35/104  soft-tissue]
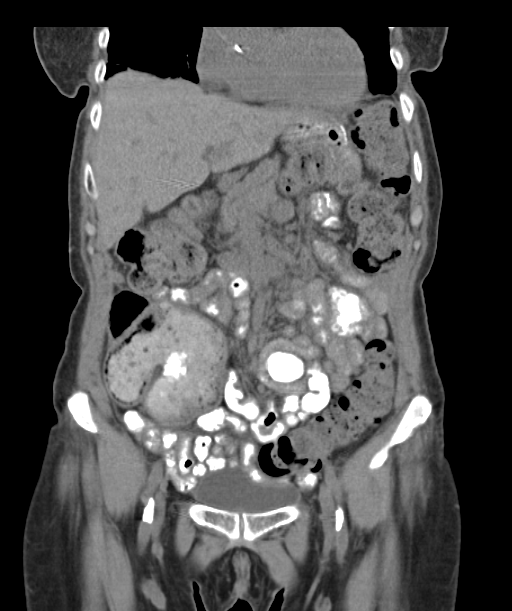
[im 46/104  soft-tissue]
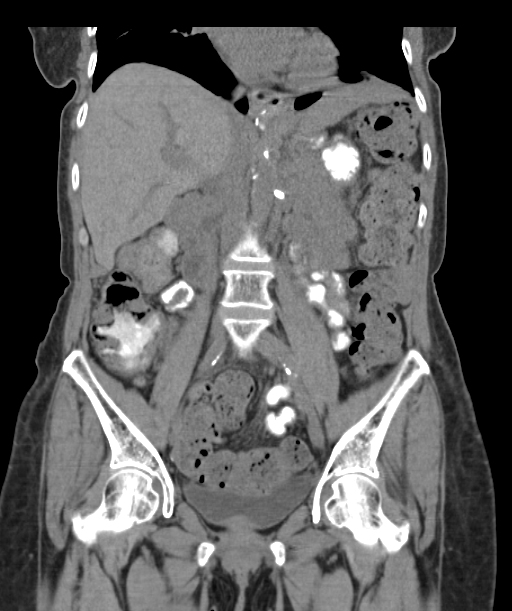
[im 58/104  soft-tissue]
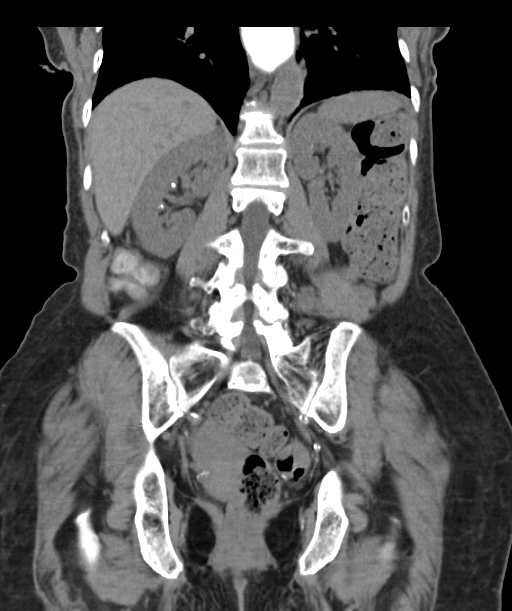

[16 of 46 positions shown; findings below may reference images not displayed]

FINDINGS: Visualized lung bases are unremarkable. Postsurgical changes are
seen involving the esophagus consistent with history of hernia
repair. Status post gastrojejunostomy is well. Status post
cholecystectomy. No focal abnormality is noted in the liver, spleen
or pancreas. Adrenal glands appear normal. Bilateral nonobstructive
nephrolithiasis is noted. Atherosclerosis of abdominal aorta is
noted without aneurysm formation. Common bile duct dilatation is
noted measuring 13 mm distally, which is increased compared to prior
exam. Mild intrahepatic biliary dilatation is noted. Stool is noted
throughout the colon. The appendix appears normal. No small bowel
dilatation is noted. No abnormal fluid collection is noted. Uterus
is unremarkable. Urinary bladder appears normal. No significant
adenopathy is noted.
IMPRESSION: Status post cholecystectomy and gastrojejunostomy. Status post
esophageal hernia repair is well.

Bilateral nonobstructive nephrolithiasis is noted.

Atherosclerosis of abdominal aorta is noted without aneurysm
formation.

Mild intrahepatic biliary dilatation is noted as well as increased
distal common bile duct dilatation compared to prior exam. This may
simply be due to post cholecystectomy status, but correlation with
liver function tests is recommended to rule out obstruction.

Stool is noted throughout the colon suggesting constipation.

## 2016-07-15 NOTE — Progress Notes (Signed)
The patient is here for scheduled hyaluronic acid injection her left knee. She has only mild to moderate arthritic changes and is mainly the patellofemoral joint and not need of a knee replacement. She is someone who has chronic aches and pains throughout her quality. She gets cramping in both her legs but is not arthritic related. She has tried and failed all forms conservative treatment so release can try a hyaluronic acid injection in her left knee to see if that would help. We went over her MRI again showed her the extent of her patellofemoral arthritic changes.  I prepped her left knee with Betadine and alcohol prior to hyaluronic acid injection without difficulty. We'll see her back in 3 months to see if this is helped her at all.

## 2016-07-17 ENCOUNTER — Telehealth (INDEPENDENT_AMBULATORY_CARE_PROVIDER_SITE_OTHER): Payer: Self-pay | Admitting: *Deleted

## 2016-07-17 NOTE — Telephone Encounter (Signed)
Patient called in this morning in regards to having some issues with her knees. She had an injection done on 07/15/16 and she is having some severe cramps in her legs and she wanted to know if this was normal or not? Her CB # [phone]. Thank you

## 2016-07-17 NOTE — Telephone Encounter (Signed)
Please advise 

## 2016-07-17 NOTE — Telephone Encounter (Signed)
Patient aware of the below message  

## 2016-07-17 NOTE — Telephone Encounter (Signed)
She has been complaining of cramping before and told me this at her appointment this week.  She was cramping before the injection.  Needs to check with her PCP about her electrolytes.

## 2016-08-12 ENCOUNTER — Ambulatory Visit (INDEPENDENT_AMBULATORY_CARE_PROVIDER_SITE_OTHER): Payer: Medicare Other | Admitting: Podiatry

## 2016-08-12 DIAGNOSIS — B351 Tinea unguium: Secondary | ICD-10-CM | POA: Diagnosis not present

## 2016-08-12 DIAGNOSIS — M79676 Pain in unspecified toe(s): Secondary | ICD-10-CM

## 2016-08-12 NOTE — Progress Notes (Signed)
Patient ID: Emily [name], female   DOB: [birth year], 76 y.o.   MRN: [id number]     Subjective: This patient presents today for scheduled visit complaining of uncomfortable toenails with walking wearing shoes and requests toenail debridement. She also describes spilling some hot water on the dorsal aspect of the right foot this morning on 08/12/2016 and is applying Vaseline to the area. She describes mild sensitivity in the area. Also, patient complains of history of itching blisters on her heels which have responded to over-the-counter topical antifungal cream   Objective: Orientated 3 DP and PT pulses 2/4 bilaterally Capillary reflex immediate bilaterally Sensation to 10 g monofilament wire intact 3/5 right 4/5 left Vibratory sensation reactive bilaterally Ankle reflex equal and reactive bilaterally No open skin lesions bilaterally Dry scaling skin heals without fissures 10 x 5 mm blister dorsal first metatarsal area without any surrounding erythema, edema or drainage. 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 She will contact her she has concerns in the future about this blister area Instructed patient to continue applying topical antifungal cream to heels daily 30 days  Reappoint 3 months or sooner if patient has concern

## 2016-08-12 NOTE — Patient Instructions (Signed)
Apply topical antibiotic ointment such as triple antibiotic ointment to the blister on the top of the right foot daily and cover with a Band-Aid until healed. Apply athlete's foot cream to the heels on your right and left feet daily 30 days

## 2016-08-19 ENCOUNTER — Encounter: Payer: Self-pay | Admitting: Gastroenterology

## 2016-08-19 ENCOUNTER — Ambulatory Visit (INDEPENDENT_AMBULATORY_CARE_PROVIDER_SITE_OTHER): Payer: Medicare Other | Admitting: Gastroenterology

## 2016-08-19 ENCOUNTER — Other Ambulatory Visit (INDEPENDENT_AMBULATORY_CARE_PROVIDER_SITE_OTHER): Payer: Medicare Other

## 2016-08-19 VITALS — BP 134/70 | HR 72 | Ht 62.0 in | Wt 138.2 lb

## 2016-08-19 DIAGNOSIS — R1084 Generalized abdominal pain: Secondary | ICD-10-CM

## 2016-08-19 DIAGNOSIS — K219 Gastro-esophageal reflux disease without esophagitis: Secondary | ICD-10-CM | POA: Diagnosis not present

## 2016-08-19 DIAGNOSIS — K582 Mixed irritable bowel syndrome: Secondary | ICD-10-CM

## 2016-08-19 DIAGNOSIS — K5900 Constipation, unspecified: Secondary | ICD-10-CM | POA: Diagnosis not present

## 2016-08-19 DIAGNOSIS — K224 Dyskinesia of esophagus: Secondary | ICD-10-CM | POA: Diagnosis not present

## 2016-08-19 DIAGNOSIS — K2270 Barrett's esophagus without dysplasia: Secondary | ICD-10-CM | POA: Diagnosis not present

## 2016-08-19 DIAGNOSIS — R1010 Upper abdominal pain, unspecified: Secondary | ICD-10-CM | POA: Diagnosis not present

## 2016-08-19 LAB — BASIC METABOLIC PANEL
BUN: 12 mg/dL (ref 6–23)
CO2: 26 mEq/L (ref 19–32)
Calcium: 8.9 mg/dL (ref 8.4–10.5)
Chloride: 111 mEq/L (ref 96–112)
Creatinine, Ser: 0.74 mg/dL (ref 0.40–1.20)
GFR: 98.09 mL/min (ref >60.00)
Glucose, Bld: 53 mg/dL — ABNORMAL LOW (ref 70–99)
Potassium: 3.8 mEq/L (ref 3.5–5.1)
Sodium: 142 mEq/L (ref 135–145)

## 2016-08-19 MED ORDER — LINACLOTIDE 72 MCG PO CAPS: 72.0000 ug | ORAL_CAPSULE | Freq: Every day | ORAL | 3 refills | Status: DC

## 2016-08-19 MED ORDER — PAROXETINE HCL 20 MG PO TABS: 20.0000 mg | ORAL_TABLET | Freq: Every day | ORAL | 3 refills | Status: DC

## 2016-08-19 MED ORDER — AMBULATORY NON FORMULARY MEDICATION: 5.0000 mL | 3 refills | Status: DC | PRN

## 2016-08-19 NOTE — Patient Instructions (Signed)
Increase your Paxil to 20 mg daily   Discontinue Lexapro  We will send in Linzess 72 mcg to your pharmacy, Do not take colace and miralax with your Linzess   Go to the basement today for labs  Continue IBGard and FDGard  Try and increase your Gabapentin to twice a day  Continue GI Cocktail as needed, No Drinking or eating 2 hours after taking the GI cocktail to prevent aspiration
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
## 2016-08-21 ENCOUNTER — Observation Stay (HOSPITAL_COMMUNITY)
Admission: EM | Admit: 2016-08-21 | Discharge: 2016-08-23 | Disposition: A | Discharge status: Home / Self Care | Payer: Medicare Other | Attending: Emergency Medicine | Admitting: Emergency Medicine

## 2016-08-21 ENCOUNTER — Emergency Department (HOSPITAL_COMMUNITY): Payer: Medicare Other

## 2016-08-21 ENCOUNTER — Observation Stay (HOSPITAL_COMMUNITY): Payer: Medicare Other | Admitting: Certified Registered"

## 2016-08-21 ENCOUNTER — Encounter (HOSPITAL_COMMUNITY)
Admission: EM | Disposition: A | Discharge status: Home / Self Care | Payer: Self-pay | Source: Home / Self Care | Attending: Emergency Medicine

## 2016-08-21 ENCOUNTER — Encounter (HOSPITAL_COMMUNITY): Payer: Self-pay | Admitting: *Deleted

## 2016-08-21 DIAGNOSIS — Z903 Acquired absence of stomach [part of]: Secondary | ICD-10-CM | POA: Diagnosis not present

## 2016-08-21 DIAGNOSIS — F17210 Nicotine dependence, cigarettes, uncomplicated: Secondary | ICD-10-CM | POA: Insufficient documentation

## 2016-08-21 DIAGNOSIS — G4730 Sleep apnea, unspecified: Secondary | ICD-10-CM | POA: Diagnosis not present

## 2016-08-21 DIAGNOSIS — K589 Irritable bowel syndrome without diarrhea: Secondary | ICD-10-CM | POA: Diagnosis not present

## 2016-08-21 DIAGNOSIS — Z79899 Other long term (current) drug therapy: Secondary | ICD-10-CM | POA: Diagnosis not present

## 2016-08-21 DIAGNOSIS — Z9884 Bariatric surgery status: Secondary | ICD-10-CM | POA: Diagnosis not present

## 2016-08-21 DIAGNOSIS — K3184 Gastroparesis: Secondary | ICD-10-CM | POA: Insufficient documentation

## 2016-08-21 DIAGNOSIS — Z7982 Long term (current) use of aspirin: Secondary | ICD-10-CM | POA: Insufficient documentation

## 2016-08-21 DIAGNOSIS — E1151 Type 2 diabetes mellitus with diabetic peripheral angiopathy without gangrene: Secondary | ICD-10-CM | POA: Insufficient documentation

## 2016-08-21 DIAGNOSIS — K219 Gastro-esophageal reflux disease without esophagitis: Secondary | ICD-10-CM | POA: Diagnosis not present

## 2016-08-21 DIAGNOSIS — I10 Essential (primary) hypertension: Secondary | ICD-10-CM | POA: Diagnosis not present

## 2016-08-21 DIAGNOSIS — F329 Major depressive disorder, single episode, unspecified: Secondary | ICD-10-CM | POA: Diagnosis not present

## 2016-08-21 DIAGNOSIS — D3A8 Other benign neuroendocrine tumors: Secondary | ICD-10-CM | POA: Diagnosis not present

## 2016-08-21 DIAGNOSIS — R109 Unspecified abdominal pain: Secondary | ICD-10-CM | POA: Diagnosis present

## 2016-08-21 DIAGNOSIS — K3580 Unspecified acute appendicitis: Secondary | ICD-10-CM | POA: Diagnosis present

## 2016-08-21 DIAGNOSIS — D509 Iron deficiency anemia, unspecified: Secondary | ICD-10-CM | POA: Diagnosis not present

## 2016-08-21 DIAGNOSIS — Z8673 Personal history of transient ischemic attack (TIA), and cerebral infarction without residual deficits: Secondary | ICD-10-CM | POA: Diagnosis not present

## 2016-08-21 DIAGNOSIS — K389 Disease of appendix, unspecified: Secondary | ICD-10-CM

## 2016-08-21 DIAGNOSIS — E1143 Type 2 diabetes mellitus with diabetic autonomic (poly)neuropathy: Secondary | ICD-10-CM | POA: Diagnosis not present

## 2016-08-21 HISTORY — PX: LAPAROSCOPIC APPENDECTOMY: SHX408

## 2016-08-21 LAB — COMPREHENSIVE METABOLIC PANEL
ALT: 30 U/L (ref 14–54)
AST: 47 U/L — ABNORMAL HIGH (ref 15–41)
Albumin: 3.6 g/dL (ref 3.5–5.0)
Alkaline Phosphatase: 82 U/L (ref 38–126)
Anion gap: 7 (ref 5–15)
BUN: 10 mg/dL (ref 6–20)
CO2: 25 mmol/L (ref 22–32)
Calcium: 8.5 mg/dL — ABNORMAL LOW (ref 8.9–10.3)
Chloride: 108 mmol/L (ref 101–111)
Creatinine, Ser: 0.65 mg/dL (ref 0.44–1.00)
GFR calc Af Amer: >60 mL/min (ref >60)
GFR calc non Af Amer: >60 mL/min (ref >60)
Glucose, Bld: 79 mg/dL (ref 65–99)
Potassium: 3.9 mmol/L (ref 3.5–5.1)
Sodium: 140 mmol/L (ref 135–145)
Total Bilirubin: 0.5 mg/dL (ref 0.3–1.2)
Total Protein: 6.1 g/dL — ABNORMAL LOW (ref 6.5–8.1)

## 2016-08-21 LAB — URINALYSIS, ROUTINE W REFLEX MICROSCOPIC
Bacteria, UA: NONE SEEN
Bilirubin Urine: NEGATIVE
Glucose, UA: NEGATIVE mg/dL
Ketones, ur: NEGATIVE mg/dL
Leukocytes, UA: NEGATIVE
Nitrite: NEGATIVE
Protein, ur: NEGATIVE mg/dL
Specific Gravity, Urine: 1.02 (ref 1.005–1.030)
pH: 5 (ref 5.0–8.0)

## 2016-08-21 LAB — GLUCOSE, CAPILLARY
Glucose-Capillary: 77 mg/dL (ref 65–99)
Glucose-Capillary: 102 mg/dL — ABNORMAL HIGH (ref 65–99)
Glucose-Capillary: 113 mg/dL — ABNORMAL HIGH (ref 65–99)

## 2016-08-21 LAB — CBC WITH DIFFERENTIAL/PLATELET
Basophils Absolute: 0 10*3/uL (ref 0.0–0.1)
Basophils Relative: 0 %
Eosinophils Absolute: 0 10*3/uL (ref 0.0–0.7)
Eosinophils Relative: 0 %
HCT: 35 % — ABNORMAL LOW (ref 36.0–46.0)
Hemoglobin: 11.1 g/dL — ABNORMAL LOW (ref 12.0–15.0)
Lymphocytes Relative: 24 %
Lymphs Abs: 2.1 10*3/uL (ref 0.7–4.0)
MCH: 24.2 pg — ABNORMAL LOW (ref 26.0–34.0)
MCHC: 31.7 g/dL (ref 30.0–36.0)
MCV: 76.3 fL — ABNORMAL LOW (ref 78.0–100.0)
Monocytes Absolute: 0.5 10*3/uL (ref 0.1–1.0)
Monocytes Relative: 5 %
Neutro Abs: 6.2 10*3/uL (ref 1.7–7.7)
Neutrophils Relative %: 71 %
Platelets: 180 10*3/uL (ref 150–400)
RBC: 4.59 MIL/uL (ref 3.87–5.11)
RDW: 14.1 % (ref 11.5–15.5)
WBC: 8.8 10*3/uL (ref 4.0–10.5)

## 2016-08-21 LAB — LIPASE, BLOOD: Lipase: 22 U/L (ref 11–51)

## 2016-08-21 SURGERY — APPENDECTOMY, LAPAROSCOPIC
Anesthesia: General | Site: Abdomen

## 2016-08-21 MED ORDER — PROPOFOL 10 MG/ML IV BOLUS
INTRAVENOUS | Status: AC
  Filled 2016-08-21: qty 20

## 2016-08-21 MED ORDER — LORATADINE 10 MG PO TABS: 10.0000 mg | ORAL_TABLET | Freq: Every day | ORAL | Status: DC | PRN

## 2016-08-21 MED ORDER — PANTOPRAZOLE SODIUM 40 MG PO TBEC
40.0000 mg | DELAYED_RELEASE_TABLET | Freq: Every day | ORAL | Status: DC
  Administered 2016-08-22 – 2016-08-23 (×2): 40 mg via ORAL
  Filled 2016-08-21 (×2): qty 1

## 2016-08-21 MED ORDER — ROCURONIUM BROMIDE 10 MG/ML (PF) SYRINGE
PREFILLED_SYRINGE | INTRAVENOUS | Status: AC
  Filled 2016-08-21: qty 5

## 2016-08-21 MED ORDER — FENTANYL CITRATE (PF) 100 MCG/2ML IJ SOLN
12.5000 ug | INTRAMUSCULAR | Status: DC | PRN
  Administered 2016-08-21 – 2016-08-22 (×5): 12.5 ug via INTRAVENOUS
  Filled 2016-08-21 (×6): qty 2

## 2016-08-21 MED ORDER — PROMETHAZINE HCL 25 MG/ML IJ SOLN
12.5000 mg | INTRAMUSCULAR | Status: DC | PRN
  Administered 2016-08-21: 12.5 mg via INTRAVENOUS
  Filled 2016-08-21: qty 1

## 2016-08-21 MED ORDER — FENTANYL CITRATE (PF) 100 MCG/2ML IJ SOLN
50.0000 ug | Freq: Once | INTRAMUSCULAR | Status: AC
  Administered 2016-08-21: 50 ug via INTRAVENOUS
  Filled 2016-08-21: qty 2

## 2016-08-21 MED ORDER — LABETALOL HCL 5 MG/ML IV SOLN
INTRAVENOUS | Status: AC
  Filled 2016-08-21: qty 4

## 2016-08-21 MED ORDER — INSULIN ASPART 100 UNIT/ML ~~LOC~~ SOLN: 0.0000 [IU] | Freq: Three times a day (TID) | SUBCUTANEOUS | Status: DC

## 2016-08-21 MED ORDER — DOCUSATE SODIUM 100 MG PO CAPS: 100.0000 mg | ORAL_CAPSULE | Freq: Two times a day (BID) | ORAL | Status: DC

## 2016-08-21 MED ORDER — DEXTROSE 50 % IV SOLN
INTRAVENOUS | Status: DC | PRN
  Administered 2016-08-21: 15 mL via INTRAVENOUS

## 2016-08-21 MED ORDER — CYCLOSPORINE 0.05 % OP EMUL
1.0000 [drp] | Freq: Two times a day (BID) | OPHTHALMIC | Status: DC
  Administered 2016-08-21 – 2016-08-23 (×4): 1 [drp] via OPHTHALMIC
  Filled 2016-08-21 (×5): qty 1

## 2016-08-21 MED ORDER — DIPHENHYDRAMINE HCL 12.5 MG/5ML PO ELIX: 12.5000 mg | ORAL_SOLUTION | Freq: Four times a day (QID) | ORAL | Status: DC | PRN

## 2016-08-21 MED ORDER — BUPIVACAINE HCL (PF) 0.25 % IJ SOLN
INTRAMUSCULAR | Status: AC
  Filled 2016-08-21: qty 30

## 2016-08-21 MED ORDER — FENTANYL CITRATE (PF) 250 MCG/5ML IJ SOLN
INTRAMUSCULAR | Status: DC | PRN
  Administered 2016-08-21 (×4): 50 ug via INTRAVENOUS

## 2016-08-21 MED ORDER — POLYETHYLENE GLYCOL 3350 17 GM/SCOOP PO POWD: 17.0000 g | Freq: Two times a day (BID) | ORAL | Status: DC

## 2016-08-21 MED ORDER — SUCCINYLCHOLINE CHLORIDE 200 MG/10ML IV SOSY
PREFILLED_SYRINGE | INTRAVENOUS | Status: DC | PRN
  Administered 2016-08-21: 60 mg via INTRAVENOUS

## 2016-08-21 MED ORDER — ONDANSETRON HCL 4 MG/2ML IJ SOLN
4.0000 mg | Freq: Four times a day (QID) | INTRAMUSCULAR | Status: DC | PRN
  Administered 2016-08-22 – 2016-08-23 (×4): 4 mg via INTRAVENOUS
  Filled 2016-08-21 (×5): qty 2

## 2016-08-21 MED ORDER — SUGAMMADEX SODIUM 200 MG/2ML IV SOLN
INTRAVENOUS | Status: DC | PRN
  Administered 2016-08-21: 150 mg via INTRAVENOUS

## 2016-08-21 MED ORDER — KCL IN DEXTROSE-NACL 10-5-0.45 MEQ/L-%-% IV SOLN
INTRAVENOUS | Status: DC
  Filled 2016-08-21: qty 1000

## 2016-08-21 MED ORDER — FENTANYL CITRATE (PF) 250 MCG/5ML IJ SOLN
INTRAMUSCULAR | Status: AC
  Filled 2016-08-21: qty 5

## 2016-08-21 MED ORDER — DOCUSATE SODIUM 100 MG PO CAPS
100.0000 mg | ORAL_CAPSULE | Freq: Two times a day (BID) | ORAL | Status: DC
  Administered 2016-08-21 – 2016-08-23 (×3): 100 mg via ORAL
  Filled 2016-08-21 (×4): qty 1

## 2016-08-21 MED ORDER — SODIUM CHLORIDE 0.9 % IV BOLUS (SEPSIS)
1000.0000 mL | Freq: Once | INTRAVENOUS | Status: AC
  Administered 2016-08-21: 1000 mL via INTRAVENOUS

## 2016-08-21 MED ORDER — OXYBUTYNIN CHLORIDE 5 MG PO TABS: 5.0000 mg | ORAL_TABLET | Freq: Every day | ORAL | Status: DC

## 2016-08-21 MED ORDER — NEOSTIGMINE METHYLSULFATE 5 MG/5ML IV SOSY
PREFILLED_SYRINGE | INTRAVENOUS | Status: AC
  Filled 2016-08-21: qty 5

## 2016-08-21 MED ORDER — METRONIDAZOLE IN NACL 5-0.79 MG/ML-% IV SOLN
500.0000 mg | Freq: Three times a day (TID) | INTRAVENOUS | Status: DC
  Administered 2016-08-21 – 2016-08-23 (×6): 500 mg via INTRAVENOUS
  Filled 2016-08-21 (×8): qty 100

## 2016-08-21 MED ORDER — ONDANSETRON 4 MG PO TBDP: 4.0000 mg | ORAL_TABLET | Freq: Four times a day (QID) | ORAL | Status: DC | PRN

## 2016-08-21 MED ORDER — ONDANSETRON HCL 4 MG/2ML IJ SOLN
INTRAMUSCULAR | Status: AC
  Filled 2016-08-21: qty 2

## 2016-08-21 MED ORDER — LACTATED RINGERS IV SOLN: INTRAVENOUS | Status: DC

## 2016-08-21 MED ORDER — LABETALOL HCL 5 MG/ML IV SOLN
5.0000 mg | Freq: Once | INTRAVENOUS | Status: AC
  Administered 2016-08-21: 5 mg via INTRAVENOUS

## 2016-08-21 MED ORDER — SODIUM CHLORIDE 0.9 % IV SOLN
INTRAVENOUS | Status: DC | PRN
  Administered 2016-08-21: 18:00:00 via INTRAVENOUS

## 2016-08-21 MED ORDER — GABAPENTIN 100 MG PO CAPS
100.0000 mg | ORAL_CAPSULE | Freq: Two times a day (BID) | ORAL | Status: DC
  Administered 2016-08-21 – 2016-08-23 (×4): 100 mg via ORAL
  Filled 2016-08-21 (×4): qty 1

## 2016-08-21 MED ORDER — IOPAMIDOL (ISOVUE-300) INJECTION 61%
INTRAVENOUS | Status: AC
  Administered 2016-08-21: 100 mL via INTRAVENOUS
  Filled 2016-08-21: qty 100

## 2016-08-21 MED ORDER — ALBUTEROL SULFATE HFA 108 (90 BASE) MCG/ACT IN AERS
INHALATION_SPRAY | RESPIRATORY_TRACT | Status: AC
  Filled 2016-08-21: qty 6.7

## 2016-08-21 MED ORDER — KCL IN DEXTROSE-NACL 10-5-0.45 MEQ/L-%-% IV SOLN
INTRAVENOUS | Status: DC
  Administered 2016-08-21 – 2016-08-22 (×2): via INTRAVENOUS
  Filled 2016-08-21 (×3): qty 1000

## 2016-08-21 MED ORDER — SUCCINYLCHOLINE CHLORIDE 200 MG/10ML IV SOSY
PREFILLED_SYRINGE | INTRAVENOUS | Status: AC
  Filled 2016-08-21: qty 10

## 2016-08-21 MED ORDER — HYDROCODONE-ACETAMINOPHEN 5-325 MG PO TABS
1.0000 | ORAL_TABLET | ORAL | Status: DC | PRN
  Administered 2016-08-21 – 2016-08-22 (×2): 1 via ORAL
  Filled 2016-08-21 (×3): qty 1

## 2016-08-21 MED ORDER — BUPIVACAINE-EPINEPHRINE 0.25% -1:200000 IJ SOLN
INTRAMUSCULAR | Status: DC | PRN
  Administered 2016-08-21: 6 mL

## 2016-08-21 MED ORDER — PROPOFOL 10 MG/ML IV BOLUS
INTRAVENOUS | Status: DC | PRN
  Administered 2016-08-21: 100 mg via INTRAVENOUS

## 2016-08-21 MED ORDER — ROCURONIUM BROMIDE 10 MG/ML (PF) SYRINGE
PREFILLED_SYRINGE | INTRAVENOUS | Status: DC | PRN
  Administered 2016-08-21: 10 mg via INTRAVENOUS
  Administered 2016-08-21: 30 mg via INTRAVENOUS

## 2016-08-21 MED ORDER — DICYCLOMINE HCL 20 MG PO TABS
20.0000 mg | ORAL_TABLET | Freq: Every day | ORAL | Status: DC | PRN
  Administered 2016-08-22 – 2016-08-23 (×2): 20 mg via ORAL
  Filled 2016-08-21 (×2): qty 1

## 2016-08-21 MED ORDER — LINACLOTIDE 72 MCG PO CAPS
72.0000 ug | ORAL_CAPSULE | Freq: Every day | ORAL | Status: DC
  Administered 2016-08-22 – 2016-08-23 (×2): 72 ug via ORAL
  Filled 2016-08-21 (×3): qty 1

## 2016-08-21 MED ORDER — DIPHENHYDRAMINE HCL 50 MG/ML IJ SOLN: 12.5000 mg | Freq: Four times a day (QID) | INTRAMUSCULAR | Status: DC | PRN

## 2016-08-21 MED ORDER — DEXTROSE 5 % IV SOLN
2.0000 g | INTRAVENOUS | Status: DC
  Administered 2016-08-21 – 2016-08-23 (×3): 2 g via INTRAVENOUS
  Filled 2016-08-21 (×3): qty 2

## 2016-08-21 MED ORDER — LATANOPROST 0.005 % OP SOLN
1.0000 [drp] | Freq: Every day | OPHTHALMIC | Status: DC
  Administered 2016-08-21 – 2016-08-22 (×2): 1 [drp] via OPHTHALMIC
  Filled 2016-08-21: qty 2.5

## 2016-08-21 MED ORDER — INSULIN ASPART 100 UNIT/ML ~~LOC~~ SOLN: 0.0000 [IU] | Freq: Every day | SUBCUTANEOUS | Status: DC

## 2016-08-21 MED ORDER — PANTOPRAZOLE SODIUM 40 MG IV SOLR: 40.0000 mg | Freq: Every day | INTRAVENOUS | Status: DC

## 2016-08-21 MED ORDER — ONDANSETRON HCL 4 MG/2ML IJ SOLN
INTRAMUSCULAR | Status: DC | PRN
  Administered 2016-08-21: 4 mg via INTRAVENOUS

## 2016-08-21 MED ORDER — LIDOCAINE 2% (20 MG/ML) 5 ML SYRINGE
INTRAMUSCULAR | Status: DC | PRN
  Administered 2016-08-21: 60 mg via INTRAVENOUS

## 2016-08-21 MED ORDER — 0.9 % SODIUM CHLORIDE (POUR BTL) OPTIME
TOPICAL | Status: DC | PRN
  Administered 2016-08-21: 1000 mL

## 2016-08-21 MED ORDER — LOSARTAN POTASSIUM 50 MG PO TABS
25.0000 mg | ORAL_TABLET | Freq: Every day | ORAL | Status: DC
  Administered 2016-08-22 – 2016-08-23 (×2): 25 mg via ORAL
  Filled 2016-08-21 (×3): qty 1

## 2016-08-21 MED ORDER — FENTANYL CITRATE (PF) 100 MCG/2ML IJ SOLN
25.0000 ug | INTRAMUSCULAR | Status: DC | PRN
  Administered 2016-08-21: 50 ug via INTRAVENOUS

## 2016-08-21 MED ORDER — POLYETHYLENE GLYCOL 3350 17 G PO PACK
17.0000 g | PACK | Freq: Every day | ORAL | Status: DC
  Administered 2016-08-22 – 2016-08-23 (×2): 17 g via ORAL
  Filled 2016-08-21 (×2): qty 1

## 2016-08-21 MED ORDER — PAROXETINE HCL 20 MG PO TABS
20.0000 mg | ORAL_TABLET | Freq: Every day | ORAL | Status: DC
  Administered 2016-08-22 – 2016-08-23 (×2): 20 mg via ORAL
  Filled 2016-08-21 (×2): qty 1

## 2016-08-21 MED ORDER — LIDOCAINE 2% (20 MG/ML) 5 ML SYRINGE
INTRAMUSCULAR | Status: AC
  Filled 2016-08-21: qty 5

## 2016-08-21 MED ORDER — LORAZEPAM 0.5 MG PO TABS
0.5000 mg | ORAL_TABLET | Freq: Every evening | ORAL | Status: DC | PRN
  Filled 2016-08-21: qty 1

## 2016-08-21 MED ORDER — FENTANYL CITRATE (PF) 100 MCG/2ML IJ SOLN
INTRAMUSCULAR | Status: AC
  Filled 2016-08-21: qty 2

## 2016-08-21 MED ORDER — HYDRALAZINE HCL 20 MG/ML IJ SOLN: 10.0000 mg | INTRAMUSCULAR | Status: DC | PRN

## 2016-08-21 SURGICAL SUPPLY — 44 items
APL SKNCLS STERI-STRIP NONHPOA (GAUZE/BANDAGES/DRESSINGS) ×1
APPLIER CLIP ROT 10 11.4 M/L (STAPLE) ×2
APR CLP MED LRG 11.4X10 (STAPLE) ×1
BAG SPEC RTRVL LRG 6X4 10 (ENDOMECHANICALS) ×1
BENZOIN TINCTURE PRP APPL 2/3 (GAUZE/BANDAGES/DRESSINGS) ×2 IMPLANT
BLADE CLIPPER SURG (BLADE) IMPLANT
CANISTER SUCT 3000ML PPV (MISCELLANEOUS) ×2 IMPLANT
CHLORAPREP W/TINT 26ML (MISCELLANEOUS) ×2 IMPLANT
CLIP APPLIE ROT 10 11.4 M/L (STAPLE) IMPLANT
COVER SURGICAL LIGHT HANDLE (MISCELLANEOUS) ×2 IMPLANT
CUTTER FLEX LINEAR 45M (STAPLE) ×2 IMPLANT
DRSG TEGADERM 2-3/8X2-3/4 SM (GAUZE/BANDAGES/DRESSINGS) ×3 IMPLANT
DRSG TEGADERM 4X4.75 (GAUZE/BANDAGES/DRESSINGS) ×2 IMPLANT
ELECT REM PT RETURN 9FT ADLT (ELECTROSURGICAL) ×2
ELECTRODE REM PT RTRN 9FT ADLT (ELECTROSURGICAL) ×1 IMPLANT
ENDOLOOP SUT PDS II  0 18 (SUTURE)
ENDOLOOP SUT PDS II 0 18 (SUTURE) IMPLANT
FILTER SMOKE EVAC LAPAROSHD (FILTER) ×2 IMPLANT
GAUZE SPONGE 2X2 8PLY STRL LF (GAUZE/BANDAGES/DRESSINGS) ×1 IMPLANT
GLOVE BIO SURGEON STRL SZ7 (GLOVE) ×2 IMPLANT
GLOVE BIOGEL PI IND STRL 7.5 (GLOVE) ×1 IMPLANT
GLOVE BIOGEL PI INDICATOR 7.5 (GLOVE) ×1
GOWN STRL REUS W/ TWL LRG LVL3 (GOWN DISPOSABLE) ×3 IMPLANT
GOWN STRL REUS W/TWL LRG LVL3 (GOWN DISPOSABLE) ×6
KIT BASIN OR (CUSTOM PROCEDURE TRAY) ×2 IMPLANT
KIT ROOM TURNOVER OR (KITS) ×2 IMPLANT
NS IRRIG 1000ML POUR BTL (IV SOLUTION) ×2 IMPLANT
PAD ARMBOARD 7.5X6 YLW CONV (MISCELLANEOUS) ×4 IMPLANT
POUCH SPECIMEN RETRIEVAL 10MM (ENDOMECHANICALS) ×2 IMPLANT
RELOAD STAPLE 45 3.5 BLU ETS (ENDOMECHANICALS) ×1 IMPLANT
RELOAD STAPLE TA45 3.5 REG BLU (ENDOMECHANICALS) ×2 IMPLANT
SET IRRIG TUBING LAPAROSCOPIC (IRRIGATION / IRRIGATOR) ×2 IMPLANT
SHEARS HARMONIC ACE PLUS 36CM (ENDOMECHANICALS) ×2 IMPLANT
SLEEVE ENDOPATH XCEL 5M (ENDOMECHANICALS) ×2 IMPLANT
SPECIMEN JAR SMALL (MISCELLANEOUS) ×2 IMPLANT
SPONGE GAUZE 2X2 STER 10/PKG (GAUZE/BANDAGES/DRESSINGS) ×1
STRIP CLOSURE SKIN 1/2X4 (GAUZE/BANDAGES/DRESSINGS) ×2 IMPLANT
SUT MNCRL AB 4-0 PS2 18 (SUTURE) ×2 IMPLANT
TOWEL OR 17X24 6PK STRL BLUE (TOWEL DISPOSABLE) ×1 IMPLANT
TOWEL OR 17X26 10 PK STRL BLUE (TOWEL DISPOSABLE) ×2 IMPLANT
TRAY LAPAROSCOPIC MC (CUSTOM PROCEDURE TRAY) ×2 IMPLANT
TROCAR XCEL BLUNT TIP 100MML (ENDOMECHANICALS) ×2 IMPLANT
TROCAR XCEL NON-BLD 5MMX100MML (ENDOMECHANICALS) ×2 IMPLANT
TUBING INSUFFLATION (TUBING) ×2 IMPLANT

## 2016-08-21 NOTE — ED Provider Notes (Signed)
Assumed care from [facility] @ 1600; see that note for full H&P. Pt in stable condition at the time of transfer. Currently awaiting General Surgery consult for early appendicitis.  UPDATE: Surgery evaluated the Pt in the ED and will admit her to their service for further evaluation and treatment.   Jenny Reichmann, MD 08/21/16 [place], MD 08/22/16 [phone]

## 2016-08-21 NOTE — Anesthesia Preprocedure Evaluation (Signed)
Anesthesia Evaluation  Patient identified by MRN, date of birth, ID band Patient awake    Reviewed: Allergy & Precautions, NPO status , Patient's Chart, lab work & pertinent test results  Airway Mallampati: II  TM Distance: >3 FB Neck ROM: Full    Dental  (+) Dental Advisory Given   Pulmonary sleep apnea , Current Smoker,    breath sounds clear to auscultation       Cardiovascular hypertension, Pt. on medications  Rhythm:Regular Rate:Normal     Neuro/Psych Depression negative neurological ROS     GI/Hepatic Neg liver ROS, GERD  ,  Endo/Other  diabetes, Type 2  Renal/GU Renal disease     Musculoskeletal   Abdominal   Peds  Hematology  (+) anemia ,   Anesthesia Other Findings   Reproductive/Obstetrics                             Lab Results  Component Value Date   WBC 8.8 08/21/2016   HGB 11.1 (L) 08/21/2016   HCT 35.0 (L) 08/21/2016   MCV 76.3 (L) 08/21/2016   PLT 180 08/21/2016   Lab Results  Component Value Date   CREATININE 0.65 08/21/2016   BUN 10 08/21/2016   NA 140 08/21/2016   K 3.9 08/21/2016   CL 108 08/21/2016   CO2 25 08/21/2016    Anesthesia Physical Anesthesia Plan  ASA: III  Anesthesia Plan: General   Post-op Pain Management:    Induction: Intravenous  Airway Management Planned: Oral ETT  Additional Equipment:   Intra-op Plan:   Post-operative Plan: Extubation in OR  Informed Consent: I have reviewed the patients History and Physical, chart, labs and discussed the procedure including the risks, benefits and alternatives for the proposed anesthesia with the patient or authorized representative who has indicated his/her understanding and acceptance.   Dental advisory given  Plan Discussed with: CRNA  Anesthesia Plan Comments:         Anesthesia Quick Evaluation
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
## 2016-08-21 NOTE — Anesthesia Procedure Notes (Signed)
Procedure Name: Intubation Date/Time: 08/21/2016 6:27 PM Performed by: Myna Bright Pre-anesthesia Checklist: Patient identified, Emergency Drugs available, Suction available and Patient being monitored Patient Re-evaluated:Patient Re-evaluated prior to inductionOxygen Delivery Method: Circle system utilized Preoxygenation: Pre-oxygenation with 100% oxygen Intubation Type: IV induction Ventilation: Mask ventilation without difficulty Laryngoscope Size: Mac and 4 Grade View: Grade I Tube type: Oral Tube size: 7.0 mm Number of attempts: 1 Airway Equipment and Method: Stylet Placement Confirmation: ETT inserted through vocal cords under direct vision,  positive ETCO2 and breath sounds checked- equal and bilateral Secured at: 21 cm Tube secured with: Tape Dental Injury: Teeth and Oropharynx as per pre-operative assessment

## 2016-08-21 NOTE — ED Triage Notes (Signed)
Pt states bil side pain, pointing to the sides of her ribs, that started yesterday.  States some RUQ pain as well.  Denies changes in bowel or bladder habits, though states some nausea when pain increases.  Took a promethazine at 0630. Tramadols and percocets are not helping with pain.

## 2016-08-21 NOTE — ED Notes (Signed)
PA at the bedside.
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
## 2016-08-21 NOTE — ED Notes (Signed)
Pt ambulatory with walker to restroom.

## 2016-08-21 NOTE — Op Note (Signed)
Appendectomy, Lap, Procedure Note  Indications: The patient presented with a history of right-sided abdominal pain. A CT scan revealed findings consistent with acute appendicitis.  She has had multiple previous abdominal surgeries as well as a percutaneous feeding tube. She presents now for laparoscopic appendectomy.  Pre-operative Diagnosis: Acute appendicitis without mention of peritonitis  Post-operative Diagnosis: Same  Surgeon: Luane Rochon K.   Assistants: none  Anesthesia: General endotracheal anesthesia  ASA Class: 2  Procedure Details  The patient was seen again in the Holding Room. The risks, benefits, complications, treatment options, and expected outcomes were discussed with the patient and/or family. The possibilities of reaction to medication, perforation of viscus, bleeding, recurrent infection, finding a normal appendix, the need for additional procedures, failure to diagnose a condition, and creating a complication requiring transfusion or operation were discussed. There was concurrence with the proposed plan and informed consent was obtained. The site of surgery was properly noted. The patient was taken to Operating Room, identified as [facility] and the procedure verified as Appendectomy. A Time Out was held and the above information confirmed.  The patient was placed in the supine position and general anesthesia was induced.  The abdomen was prepped and draped in a sterile fashion. A one centimeter infraumbilical incision was made.  Dissection was carried down to the fascia bluntly.  The fascia was incised vertically.  We entered the peritoneal cavity bluntly.  I explored the peritoneal surface with my finger and there were no adhesions at the umbilicus.  A pursestring suture was passed around the incision with a 0 Vicryl.  The Hasson cannula was introduced into the abdomen and the tails of the suture were used to hold the Hasson in place.     The pneumoperitoneum was  then established maintaining a maximum pressure of 15 mmHg.  Additional 5 mm cannulas then placed in the left lower quadrant of the abdomen and the right upper quadrant under direct visualization. A careful evaluation of the entire abdomen was carried out. The patient was placed in Trendelenburg and left lateral decubitus position.  There is a segment of small bowel adherent to the LUQ, consistent with the previous feeding jejunostomy.  There are some adhesions in the upper abdomen from her previous gastrectomy, but no adhesions in the pelvis.The scope was moved to the right upper quadrant port site. The cecum was mobilized medially.  The appendix was in a retrocecal location and is quite tortuous.  The appendix was carefully dissected. The appendix was was skeletonized with the harmonic scalpel.   The appendix was divided at its base using an endo-GIA stapler. There was no evidence of bleeding, leakage, or complication after division of the appendix. Irrigation was also performed and irrigate suctioned from the abdomen as well.  The umbilical port site was closed with the purse string suture. There was no residual palpable fascial defect.  The trocar site skin wounds were closed with 4-0 Monocryl.  Instrument, sponge, and needle counts were correct at the conclusion of the case.   Findings: The appendix was found to be inflamed. There were not signs of necrosis.  There was not perforation. There was not abscess formation.  Estimated Blood Loss:  Minimal         Drains: none         Specimens: appendix         Complications:  None; patient tolerated the procedure well.         Disposition: PACU - hemodynamically stable.  Condition: stable  Imogene Burn. Georgette Dover, MD, [facility] Surgery  General/ Trauma Surgery  08/21/2016 7:45 PM

## 2016-08-21 NOTE — Anesthesia Postprocedure Evaluation (Signed)
Anesthesia Post Note  Patient: Emily [name]  Procedure(s) Performed: Procedure(s) (LRB): APPENDECTOMY LAPAROSCOPIC (N/A)  Patient location during evaluation: PACU Anesthesia Type: General Level of consciousness: awake and alert Pain management: pain level controlled Vital Signs Assessment: post-procedure vital signs reviewed and stable Respiratory status: spontaneous breathing, nonlabored ventilation, respiratory function stable and patient connected to nasal cannula oxygen Cardiovascular status: blood pressure returned to baseline and stable Postop Assessment: no signs of nausea or vomiting Anesthetic complications: no       Last Vitals:  Vitals:   08/21/16 2045 08/21/16 2100  BP: (!) 163/89 (!) 163/86  Pulse: 80 82  Resp: (!) 9 12  Temp: 36.6 C     Last Pain:  Vitals:   08/21/16 2045  TempSrc:   PainSc: Tyler Deis

## 2016-08-21 NOTE — ED Notes (Signed)
Pt taken to Xray.

## 2016-08-21 NOTE — ED Notes (Signed)
Pt returned from X-ray.  

## 2016-08-21 NOTE — Transfer of Care (Signed)
Immediate Anesthesia Transfer of Care Note  Patient: Emily [name]  Procedure(s) Performed: Procedure(s): APPENDECTOMY LAPAROSCOPIC (N/A)  Patient Location: PACU  Anesthesia Type:General  Level of Consciousness: awake and alert   Airway & Oxygen Therapy: Patient Spontanous Breathing and Patient connected to nasal cannula oxygen  Post-op Assessment: Report given to RN, Post -op Vital signs reviewed and stable and Patient moving all extremities  Post vital signs: Reviewed and stable  Last Vitals:  Vitals:   08/21/16 1515 08/21/16 1635  BP: (!) 176/66 (!) 187/69  Pulse: 67   Resp: 16   Temp:      Last Pain:  Vitals:   08/21/16 1056  TempSrc:   PainSc: 6          Complications: No apparent anesthesia complications

## 2016-08-21 NOTE — ED Notes (Signed)
Lab contacted regarding specimens not in process, states they have not received yet. Confirmed with phelobotomist that labs were sent.

## 2016-08-22 ENCOUNTER — Encounter (HOSPITAL_COMMUNITY): Payer: Self-pay | Admitting: Surgery

## 2016-08-22 DIAGNOSIS — D3A8 Other benign neuroendocrine tumors: Secondary | ICD-10-CM | POA: Diagnosis not present

## 2016-08-22 LAB — GLUCOSE, CAPILLARY
Glucose-Capillary: 121 mg/dL — ABNORMAL HIGH (ref 65–99)
Glucose-Capillary: 110 mg/dL — ABNORMAL HIGH (ref 65–99)
Glucose-Capillary: 121 mg/dL — ABNORMAL HIGH (ref 65–99)
Glucose-Capillary: 168 mg/dL — ABNORMAL HIGH (ref 65–99)

## 2016-08-22 MED ORDER — ENOXAPARIN SODIUM 40 MG/0.4ML ~~LOC~~ SOLN
40.0000 mg | SUBCUTANEOUS | Status: DC
  Administered 2016-08-22 – 2016-08-23 (×2): 40 mg via SUBCUTANEOUS
  Filled 2016-08-22 (×2): qty 0.4

## 2016-08-22 MED ORDER — HYDROCODONE-ACETAMINOPHEN 5-325 MG PO TABS
1.0000 | ORAL_TABLET | ORAL | Status: DC | PRN
  Administered 2016-08-22: 1 via ORAL
  Administered 2016-08-22 – 2016-08-23 (×5): 2 via ORAL
  Filled 2016-08-22 (×5): qty 2

## 2016-08-22 MED ORDER — MENTHOL 3 MG MT LOZG
1.0000 | LOZENGE | OROMUCOSAL | Status: DC | PRN
  Administered 2016-08-22: 3 mg via ORAL
  Filled 2016-08-22: qty 9

## 2016-08-22 NOTE — Progress Notes (Signed)
[facility] Progress Note  1 Day Post-Op  Subjective:  CC: abdominal pain and nausea  Pt states persistent abdominal pain in the RLQ that radiates into her back and into her ribs. She ate breakfast but is now nauseated. No vomiting, fever, chills or flatus.   Objective: Vital signs in last 24 hours: Temp:  [97 F (36.1 C)-99.3 F (37.4 C)] 99.3 F (37.4 C) (04/21 0933) Pulse Rate:  [58-98] 91 (04/21 0933) Resp:  [9-19] 18 (04/21 0933) BP: (113-219)/(60-100) 145/60 (04/21 0933) SpO2:  [91 %-100 %] 95 % (04/21 0933) Weight:  [143 lb 11.8 oz (65.2 kg)] 143 lb 11.8 oz (65.2 kg) (04/20 2141) Last BM Date:  (pre op)  Intake/Output from previous day: 04/20 0701 - 04/21 0700 In: 7989 [P.O.:240; I.V.:850; IV Piggyback:100] Out: 670 [Urine:650; Blood:20] Intake/Output this shift: Total I/O In: 118 [P.O.:118] Out: 200 [Urine:200]  PE: Gen:  Alert, NAD, pleasant, cooperative, well appearing Card:  RRR, no M/G/R heard Pulm:  CTA, no W/R/R, effort normal Abd: Soft, not distended, +BS, incisions C/D/I, appropriately generalized tenderness with worsening TTP in RLQ Skin: no rashes noted, warm and dry  Lab Results:   Recent Labs  08/21/16 1255  WBC 8.8  HGB 11.1*  HCT 35.0*  PLT 180   BMET  Recent Labs  08/19/16 1522 08/21/16 1112  NA 142 140  K 3.8 3.9  CL 111 108  CO2 26 25  GLUCOSE 53* 79  BUN 12 10  CREATININE 0.74 0.65  CALCIUM 8.9 8.5*   PT/INR No results for input(s): LABPROT, INR in the last 72 hours. CMP     Component Value Date/Time   NA 140 08/21/2016 1112   K 3.9 08/21/2016 1112   CL 108 08/21/2016 1112   CO2 25 08/21/2016 1112   GLUCOSE 79 08/21/2016 1112   BUN 10 08/21/2016 1112   CREATININE 0.65 08/21/2016 1112   CALCIUM 8.5 (L) 08/21/2016 1112   PROT 6.1 (L) 08/21/2016 1112   ALBUMIN 3.6 08/21/2016 1112   AST 47 (H) 08/21/2016 1112   ALT 30 08/21/2016 1112   ALKPHOS 82 08/21/2016 1112   BILITOT 0.5 08/21/2016 1112    GFRNONAA >60 08/21/2016 1112   GFRAA >60 08/21/2016 1112   Lipase     Component Value Date/Time   LIPASE 22 08/21/2016 1112       Studies/Results: Dg Chest 2 View  Result Date: 08/21/2016 CLINICAL DATA:  Cough. Bilateral lower rib pain. Fine crackles on the left lower lobe. EXAM: CHEST  2 VIEW COMPARISON:  06/02/2013 chest radiograph. FINDINGS: Stable cardiomediastinal silhouette with top-normal heart size and aortic atherosclerosis. No pneumothorax. No pleural effusion. Mild hazy basilar left lung opacities are new. No pulmonary edema. Cholecystectomy clips are seen in the right upper quadrant of the abdomen. Stable metallic density in the left breast. IMPRESSION: New hazy mild left basilar lung opacities, cannot exclude a mild pneumonia. Recommend follow-up PA and lateral post treatment chest radiographs in 4-6 weeks. Aortic atherosclerosis. Electronically Signed   By: Ilona Sorrel M.D.   On: 08/21/2016 10:57   Ct Abdomen Pelvis W Contrast  Result Date: 08/21/2016 CLINICAL DATA:  76 year old female with right-sided abdominal and pelvic pain with nausea. EXAM: CT ABDOMEN AND PELVIS WITH CONTRAST TECHNIQUE: Multidetector CT imaging of the abdomen and pelvis was performed using the standard protocol following bolus administration of intravenous contrast. CONTRAST:  80 cc intravenous Isovue-300 COMPARISON:  07/23/2015 and prior CTs FINDINGS: Lower chest: No acute abnormality.  Mild bibasilar  scarring noted. Hepatobiliary: The liver is unremarkable. The patient is status post cholecystectomy. CBD dilatation is not significantly changed from prior studies. Pancreas: Unremarkable Spleen: Unremarkable Adrenals/Urinary Tract: Bilateral renal cysts are again noted. The adrenal glands and bladder are unremarkable. Stomach/Bowel: A distal appendicolith is now noted. Appendix is upper limits of normal in size measuring 7 mm. Gastroenteric surgical changes again noted. There is no evidence of bowel  obstruction for definite bowel wall thickening. Colonic diverticulosis noted without diverticulitis. Vascular/Lymphatic: Abdominal aortic atherosclerotic calcifications noted without aneurysm. No enlarged lymph nodes identified. Reproductive: Uterus and bilateral adnexa are unremarkable. Other: No ascites, focal collection or pneumoperitoneum. Musculoskeletal: No acute or suspicious bony abnormalities. IMPRESSION: Distal appendicolith now noted with upper limits of normal caliber appendix. Early or mild appendicitis is not entirely excluded. Correlate with pain. Abdominal aortic atherosclerosis. Unchanged CBD dilatation. Electronically Signed   By: Margarette Canada M.D.   On: 08/21/2016 14:16    Anti-infectives: Anti-infectives    Start     Dose/Rate Route Frequency Ordered Stop   08/21/16 1630  cefTRIAXone (ROCEPHIN) 2 g in dextrose 5 % 50 mL IVPB     2 g 100 mL/hr over 30 Minutes Intravenous Every 24 hours 08/21/16 1619     08/21/16 1630  metroNIDAZOLE (FLAGYL) IVPB 500 mg     500 mg 100 mL/hr over 60 Minutes Intravenous Every 8 hours 08/21/16 1619         Assessment/Plan  Iron deficiency anemia - Hg 11.1 Depression - home meds DM type II - SSI HTN GERD - Protonix  S/P total gastrectomy and Rouz-en-Y esophagojejunal anastomosis 2012  Appendicitis - S/P lap appy, Dr. Georgette Dover, 4/20 - pain not well controlled  FEN: reg diet with nausea VTE: SCD's ID: Rocephin and flagyl  Plan: pt with nausea this morning and no flatus. Oral pain control, ambulate. Possible discharge this afternoon or tomorrow.    LOS: 0 days    Kalman Drape , [facility] 08/22/2016, 9:49 AM Pager: [phone] Consults: [phone] Mon-Fri 7:00 am-4:30 pm Sat-Sun 7:00 am-11:30 am

## 2016-08-23 DIAGNOSIS — D3A8 Other benign neuroendocrine tumors: Secondary | ICD-10-CM | POA: Diagnosis not present

## 2016-08-23 LAB — BASIC METABOLIC PANEL
Anion gap: 7 (ref 5–15)
BUN: 6 mg/dL (ref 6–20)
CO2: 28 mmol/L (ref 22–32)
Calcium: 8.5 mg/dL — ABNORMAL LOW (ref 8.9–10.3)
Chloride: 105 mmol/L (ref 101–111)
Creatinine, Ser: 0.73 mg/dL (ref 0.44–1.00)
GFR calc Af Amer: >60 mL/min (ref >60)
GFR calc non Af Amer: >60 mL/min (ref >60)
Glucose, Bld: 102 mg/dL — ABNORMAL HIGH (ref 65–99)
Potassium: 4.2 mmol/L (ref 3.5–5.1)
Sodium: 140 mmol/L (ref 135–145)

## 2016-08-23 LAB — CBC
HCT: 33.9 % — ABNORMAL LOW (ref 36.0–46.0)
Hemoglobin: 10.4 g/dL — ABNORMAL LOW (ref 12.0–15.0)
MCH: 23.3 pg — ABNORMAL LOW (ref 26.0–34.0)
MCHC: 30.7 g/dL (ref 30.0–36.0)
MCV: 76 fL — ABNORMAL LOW (ref 78.0–100.0)
Platelets: 204 10*3/uL (ref 150–400)
RBC: 4.46 MIL/uL (ref 3.87–5.11)
RDW: 13.9 % (ref 11.5–15.5)
WBC: 7.5 10*3/uL (ref 4.0–10.5)

## 2016-08-23 LAB — GLUCOSE, CAPILLARY
Glucose-Capillary: 109 mg/dL — ABNORMAL HIGH (ref 65–99)
Glucose-Capillary: 124 mg/dL — ABNORMAL HIGH (ref 65–99)

## 2016-08-23 MED ORDER — PHENOL 1.4 % MT LIQD: 1.0000 | OROMUCOSAL | Status: DC | PRN

## 2016-08-23 MED ORDER — BISACODYL 10 MG RE SUPP
10.0000 mg | Freq: Once | RECTAL | Status: AC
  Administered 2016-08-23: 10 mg via RECTAL
  Filled 2016-08-23: qty 1

## 2016-08-23 MED ORDER — HYDROCODONE-ACETAMINOPHEN 5-325 MG PO TABS: 1.0000 | ORAL_TABLET | ORAL | 0 refills | Status: DC | PRN

## 2016-08-23 MED ORDER — PROMETHAZINE HCL 25 MG PO TABS
12.5000 mg | ORAL_TABLET | Freq: Four times a day (QID) | ORAL | Status: DC | PRN
  Administered 2016-08-23 (×2): 12.5 mg via ORAL
  Filled 2016-08-23 (×3): qty 1

## 2016-08-23 NOTE — Discharge Instructions (Signed)
Please arrive at least 30 min before your appointment to complete your check in paperwork.  If you are unable to arrive 30 min prior to your appointment time we may have to cancel or reschedule you. ° °LAPAROSCOPIC SURGERY: POST OP INSTRUCTIONS  °1. DIET: Follow a light bland diet the first 24 hours after arrival home, such as soup, liquids, crackers, etc. Be sure to include lots of fluids daily. Avoid fast food or heavy meals as your are more likely to get nauseated. Eat a low fat the next few days after surgery.  °2. Take your usually prescribed home medications unless otherwise directed. °3. PAIN CONTROL:  °1. Pain is best controlled by a usual combination of three different methods TOGETHER:  °1. Ice/Heat °2. Over the counter pain medication °3. Prescription pain medication °2. Most patients will experience some swelling and bruising around the incisions. Ice packs or heating pads (30-60 minutes up to 6 times a day) will help. Use ice for the first few days to help decrease swelling and bruising, then switch to heat to help relax tight/sore spots and speed recovery. Some people prefer to use ice alone, heat alone, alternating between ice & heat. Experiment to what works for you. Swelling and bruising can take several weeks to resolve.  °3. It is helpful to take an over-the-counter pain medication regularly for the first few weeks. Choose one of the following that works best for you:  °1. Naproxen (Aleve, etc) Two 220mg tabs twice a day °2. Ibuprofen (Advil, etc) Three 200mg tabs four times a day (every meal & bedtime) °3. Acetaminophen (Tylenol, etc) 500-650mg four times a day (every meal & bedtime) °4. A prescription for pain medication (such as oxycodone, hydrocodone, etc) should be given to you upon discharge. Take your pain medication as prescribed.  °1. If you are having problems/concerns with the prescription medicine (does not control pain, nausea, vomiting, rash, itching, etc), please call us [phone]  [phone] to see if we need to switch you to a different pain medicine that will work better for you and/or control your side effect better. °2. If you need a refill on your pain medication, please contact your pharmacy. They will contact our office to request authorization. Prescriptions will not be filled after 5 pm or on week-ends. °4. Avoid getting constipated. Between the surgery and the pain medications, it is common to experience some constipation. Increasing fluid intake and taking a fiber supplement (such as Metamucil, Citrucel, FiberCon, MiraLax, etc) 1-2 times a day regularly will usually help prevent this problem from occurring. A mild laxative (prune juice, Milk of Magnesia, MiraLax, etc) should be taken according to package directions if there are no bowel movements after 48 hours.  °5. Watch out for diarrhea. If you have many loose bowel movements, simplify your diet to bland foods & liquids for a few days. Stop any stool softeners and decrease your fiber supplement. Switching to mild anti-diarrheal medications (Kayopectate, Pepto Bismol) can help. If this worsens or does not improve, please call us. °6. Wash / shower every day. You may shower over the dressings as they are waterproof. Continue to shower over incision(s) after the dressing is off. °7. Remove your waterproof bandages 5 days after surgery. You may leave the incision open to air. You may replace a dressing/Band-Aid to cover the incision for comfort if you wish.  °8. ACTIVITIES as tolerated:  °1. You may resume regular (light) daily activities beginning the next day--such as daily self-care, walking, climbing stairs--gradually   increasing activities as tolerated. If you can walk 30 minutes without difficulty, it is safe to try more intense activity such as jogging, treadmill, bicycling, low-impact aerobics, swimming, etc. °2. Save the most intensive and strenuous activity for last such as sit-ups, heavy lifting, contact sports, etc Refrain  from any heavy lifting or straining until you are off narcotics for pain control.  °3. DO NOT PUSH THROUGH PAIN. Let pain be your guide: If it hurts to do something, don't do it. Pain is your body warning you to avoid that activity for another week until the pain goes down. °4. You may drive when you are no longer taking prescription pain medication, you can comfortably wear a seatbelt, and you can safely maneuver your car and apply brakes. °5. You may have sexual intercourse when it is comfortable.  °9. FOLLOW UP in our office  °1. Please call [facility] at [phone] to set up an appointment to see your surgeon in the office for a follow-up appointment approximately 2-3 weeks after your surgery. °2. Make sure that you call for this appointment the day you arrive home to insure a convenient appointment time. °     10. IF YOU HAVE DISABILITY OR FAMILY LEAVE FORMS, BRING THEM TO THE               OFFICE FOR PROCESSING.  ° °WHEN TO CALL US [phone]:  °1. Poor pain control °2. Reactions / problems with new medications (rash/itching, nausea, etc)  °3. Fever over 101.5 F (38.5 C) °4. Inability to urinate °5. Nausea and/or vomiting °6. Worsening swelling or bruising °7. Continued bleeding from incision. °8. Increased pain, redness, or drainage from the incision ° °The clinic staff is available to answer your questions during regular business hours (8:30am-5pm). Please don’t hesitate to call and ask to speak to one of our nurses for clinical concerns.  °If you have a medical emergency, go to the nearest emergency room or call 911.  °A surgeon from [facility] is always on call at the hospitals  ° °[facility]  °[address], [address], [place], [place] [address] ?  °MAIN: [phone] ? TOLL FREE: [phone] ?  °FAX [phone]  °[email or web address] ° °

## 2016-08-23 NOTE — Discharge Summary (Signed)
[facility] Discharge Summary   Patient ID: Emily [name] MRN: [id number] DOB/AGE: 10-19-1940 76 y.o.  Admit date: 08/21/2016 Discharge date: 08/23/2016  Admitting Diagnosis: appendicitis   Discharge Diagnosis Patient Active Problem List   Diagnosis Date Noted  . Acute appendicitis 08/21/2016  . Chronic pain of left knee 06/01/2016  . Chronic pain of right knee 06/01/2016  . Cerebral infarction due to unspecified mechanism   . Nausea without vomiting 06/26/2014  . 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 S/P total gastrectomy and Roux-en-Y esophagojejunal anastomosis 2012 10/21/2012  . 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 Abdominal pain 10/20/2012  . Diarrhea 10/20/2012  . CAP (community acquired pneumonia) 08/16/2012  . Dilation of biliary tract 08/16/2012  . Elevated LFTs 08/16/2012  . UTI (urinary tract infection) 08/13/2012  . Leukocytosis 08/12/2012  . Hyponatremia 08/12/2012  . Acute kidney failure ([place]) 08/12/2012  . HYPOKALEMIA 03/11/2010  . Nausea with vomiting 06/03/2009  . GI BLEED 05/18/2008  . Abdominal pain, epigastric 05/18/2008  . ANEMIA, IRON DEFICIENCY 01/24/2008  . Essential hypertension 10/07/2007  . DIVERTICULOSIS, COLON 10/07/2007  . DM, UNCOMPLICATED, TYPE II 03/50/0938  . DISORDER, DEPRESSIVE NEC 10/02/2006  . GERD 10/02/2006    Consultants None  Imaging: Dg Chest 2 View  Result Date: 08/21/2016 CLINICAL DATA:  Cough. Bilateral lower rib pain. Fine crackles on the left lower lobe. EXAM: CHEST  2 VIEW COMPARISON:  06/02/2013 chest radiograph. FINDINGS: Stable cardiomediastinal silhouette with top-normal heart size and aortic atherosclerosis. No pneumothorax. No pleural effusion. Mild hazy basilar left lung opacities are new. No pulmonary edema. Cholecystectomy clips are seen in the right upper quadrant of the abdomen. Stable metallic density in the left breast. IMPRESSION: New hazy mild left basilar  lung opacities, cannot exclude a mild pneumonia. Recommend follow-up PA and lateral post treatment chest radiographs in 4-6 weeks. Aortic atherosclerosis. Electronically Signed   By: Ilona Sorrel M.D.   On: 08/21/2016 10:57   Ct Abdomen Pelvis W Contrast  Result Date: 08/21/2016 CLINICAL DATA:  76 year old female with right-sided abdominal and pelvic pain with nausea. EXAM: CT ABDOMEN AND PELVIS WITH CONTRAST TECHNIQUE: Multidetector CT imaging of the abdomen and pelvis was performed using the standard protocol following bolus administration of intravenous contrast. CONTRAST:  80 cc intravenous Isovue-300 COMPARISON:  07/23/2015 and prior CTs FINDINGS: Lower chest: No acute abnormality.  Mild bibasilar scarring noted. Hepatobiliary: The liver is unremarkable. The patient is status post cholecystectomy. CBD dilatation is not significantly changed from prior studies. Pancreas: Unremarkable Spleen: Unremarkable Adrenals/Urinary Tract: Bilateral renal cysts are again noted. The adrenal glands and bladder are unremarkable. Stomach/Bowel: A distal appendicolith is now noted. Appendix is upper limits of normal in size measuring 7 mm. Gastroenteric surgical changes again noted. There is no evidence of bowel obstruction for definite bowel wall thickening. Colonic diverticulosis noted without diverticulitis. Vascular/Lymphatic: Abdominal aortic atherosclerotic calcifications noted without aneurysm. No enlarged lymph nodes identified. Reproductive: Uterus and bilateral adnexa are unremarkable. Other: No ascites, focal collection or pneumoperitoneum. Musculoskeletal: No acute or suspicious bony abnormalities. IMPRESSION: Distal appendicolith now noted with upper limits of normal caliber appendix. Early or mild appendicitis is not entirely excluded. Correlate with pain. Abdominal aortic atherosclerosis. Unchanged CBD dilatation. Electronically Signed   By: Margarette Canada M.D.   On: 08/21/2016 14:16    Procedures Dr. Georgette Dover  (08/21/16) - Laparoscopic Appendectomy  Hospital Course:  [name] is a 76 year old female with a history of IBS, recurrent hiatal hernia, and gastroparesis s/p  gastrojejunostomy feeding tube/decompressive PEG and subsequent total gastrectomy at [facility] who presented to [facility] with abdominal pain.  Workup showed appendicitis.  Patient was admitted and underwent procedure listed above.  Tolerated procedure well and was transferred to the floor.  Diet was advanced as tolerated. Pt had some nausea and vomiting which is chronic. This improved with medication. On POD#2, the patient was voiding well, tolerating diet, ambulating well, pain well controlled, vital signs stable, incisions c/d/i and felt stable for discharge home.  Patient will follow up in our office in 2 weeks and knows to call with questions or concerns.  She will call to confirm appointment date/time.    Patient was discharged in good condition.  The [place] Substance controlled database was reviewed prior to prescribing narcotic pain medication to this patient.  Physical Exam: Gen:  Alert, NAD, pleasant, cooperative, well appearing Card:  RRR, no M/G/R heard Pulm:  CTA, no W/R/R, effort normal Abd: Soft, not distended, +BS, incisions C/D/I, appropriately generalized tenderness with worsening TTP in RLQ Skin: no rashes noted, warm and dry  Allergies as of 08/23/2016   No Known Allergies     Medication List    TAKE these medications   acetaminophen 500 MG tablet Commonly known as:  TYLENOL Take 1,000 mg by mouth every 6 (six) hours as needed for moderate pain or headache.   AMBULATORY NON FORMULARY MEDICATION Take 5 mLs by mouth every 4 (four) hours as needed. Medication Name: GI cocktail 400 mg Maalox 10 mg Dicyclomine  2% Lidocaine What changed:  reasons to take this  additional instructions   aspirin 81 MG tablet Take 81 mg by mouth daily.   cetirizine 10 MG tablet Commonly known as:  ZYRTEC Take 10 mg  by mouth once a day as needed for allergies   dicyclomine 20 MG tablet Commonly known as:  BENTYL Take 20 mg by mouth daily as needed for spasms.   docusate sodium 100 MG capsule Commonly known as:  COLACE Take 1 capsule (100 mg total) by mouth every 12 (twelve) hours.   esomeprazole 40 MG capsule Commonly known as:  NEXIUM take 1 capsule by mouth twice a day   gabapentin 100 MG capsule Commonly known as:  NEURONTIN Take 100 mg by mouth 2 (two) times daily.   HYDROcodone-acetaminophen 5-325 MG tablet Commonly known as:  NORCO/VICODIN Take 1 tablet by mouth every 4 (four) hours as needed for moderate pain or severe pain. What changed:  when to take this  reasons to take this  additional instructions   linaclotide 72 MCG capsule Commonly known as:  LINZESS Take 1 capsule (72 mcg total) by mouth daily before breakfast.   LORazepam 0.5 MG tablet Commonly known as:  ATIVAN One every night at bedtime What changed:  how much to take  how to take this  when to take this  reasons to take this  additional instructions   losartan 25 MG tablet Commonly known as:  COZAAR Take 25 mg by mouth daily.   OVER THE COUNTER MEDICATION FD Donald Prose (for recurring indigestion) capsules: Take 1 capsule by mouth three times a day   oxybutynin 5 MG tablet Commonly known as:  DITROPAN Take 5 mg by mouth daily.   PARoxetine 20 MG tablet Commonly known as:  PAXIL Take 1 tablet (20 mg total) by mouth daily. At bedtime What changed:  when to take this  additional instructions   polyethylene glycol powder powder Commonly known as:  GLYCOLAX/MIRALAX Take 17  g by mouth 2 (two) times daily. Until daily soft stools  OTC   promethazine 25 MG tablet Commonly known as:  PHENERGAN take 1 tablet by mouth every 4 to 6 hours if needed for nausea   RESTASIS 0.05 % ophthalmic emulsion Generic drug:  cycloSPORINE Place 1 drop into both eyes 2 (two) times daily.   tiZANidine 4 MG  tablet Commonly known as:  ZANAFLEX Take 4 mg by mouth 2 (two) times daily as needed for muscle spasms.   Travoprost (BAK Free) 0.004 % Soln ophthalmic solution Commonly known as:  TRAVATAN Place 1 drop into both eyes at bedtime.   VOLTAREN 1 % Gel Generic drug:  diclofenac sodium Apply 2 g topically daily as needed (pain).      Follow-up [facility], [place]. Schedule an appointment as soon as possible for a visit in 2 week(s).   Specialty:  General Surgery Why:  for post surgical follow up  Contact information: [address] [place] [phone]            Signed: [facility] 08/23/2016, 9:40 AM Pager: [phone] Consults: [phone] Mon-Fri 7:00 am-4:30 pm Sat-Sun 7:00 am-11:30 am

## 2016-09-03 ENCOUNTER — Telehealth: Payer: Self-pay | Admitting: Hematology

## 2016-09-03 ENCOUNTER — Encounter: Payer: Self-pay | Admitting: Hematology

## 2016-09-03 NOTE — Telephone Encounter (Signed)
Cld the pt to schedule an appt. Appt has been scheduled for the pt to see Dr. Burr Medico on 5/18 at 230pm. Pt agreed to the appt date and time. Address and insurance verified. Letter mailed.

## 2016-09-18 ENCOUNTER — Telehealth: Payer: Self-pay | Admitting: Hematology

## 2016-09-18 ENCOUNTER — Encounter: Payer: Self-pay | Admitting: Hematology

## 2016-09-18 ENCOUNTER — Ambulatory Visit (HOSPITAL_BASED_OUTPATIENT_CLINIC_OR_DEPARTMENT_OTHER): Payer: Medicare Other | Admitting: Hematology

## 2016-09-18 DIAGNOSIS — R1031 Right lower quadrant pain: Secondary | ICD-10-CM | POA: Diagnosis not present

## 2016-09-18 DIAGNOSIS — Z720 Tobacco use: Secondary | ICD-10-CM | POA: Diagnosis not present

## 2016-09-18 DIAGNOSIS — C7A8 Other malignant neuroendocrine tumors: Secondary | ICD-10-CM | POA: Diagnosis not present
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
## 2016-09-18 NOTE — Telephone Encounter (Signed)
Appointments scheduled per 09/18/16 los. Lab closed.... Labs added for next business date of 09/21/16. Patient was given a copy of the AVS report and appointment schedule per 05/818/18 los.

## 2016-09-19 ENCOUNTER — Encounter: Payer: Self-pay | Admitting: Hematology

## 2016-09-21 ENCOUNTER — Other Ambulatory Visit (HOSPITAL_BASED_OUTPATIENT_CLINIC_OR_DEPARTMENT_OTHER): Payer: Medicare Other

## 2016-09-21 DIAGNOSIS — C7A8 Other malignant neuroendocrine tumors: Secondary | ICD-10-CM

## 2016-09-21 LAB — CBC WITH DIFFERENTIAL/PLATELET
BASO%: 0.4 % (ref 0.0–2.0)
Basophils Absolute: 0 10*3/uL (ref 0.0–0.1)
EOS%: 1.6 % (ref 0.0–7.0)
Eosinophils Absolute: 0.1 10*3/uL (ref 0.0–0.5)
HCT: 40.2 % (ref 34.8–46.6)
HGB: 12.5 g/dL (ref 11.6–15.9)
LYMPH%: 32.5 % (ref 14.0–49.7)
MCH: 24.1 pg — ABNORMAL LOW (ref 25.1–34.0)
MCHC: 31.2 g/dL — ABNORMAL LOW (ref 31.5–36.0)
MCV: 77.2 fL — ABNORMAL LOW (ref 79.5–101.0)
MONO#: 0.4 10*3/uL (ref 0.1–0.9)
MONO%: 5.8 % (ref 0.0–14.0)
NEUT#: 3.8 10*3/uL (ref 1.5–6.5)
NEUT%: 59.7 % (ref 38.4–76.8)
Platelets: 186 10*3/uL (ref 145–400)
RBC: 5.21 10*6/uL (ref 3.70–5.45)
RDW: 14.4 % (ref 11.2–14.5)
WBC: 6.4 10*3/uL (ref 3.9–10.3)
lymph#: 2.1 10*3/uL (ref 0.9–3.3)

## 2016-09-21 LAB — COMPREHENSIVE METABOLIC PANEL
ALT: 26 U/L (ref 0–55)
AST: 30 U/L (ref 5–34)
Albumin: 4.2 g/dL (ref 3.5–5.0)
Alkaline Phosphatase: 79 U/L (ref 40–150)
Anion Gap: 10 mEq/L (ref 3–11)
BUN: 10.8 mg/dL (ref 7.0–26.0)
CO2: 25 mEq/L (ref 22–29)
Calcium: 9.2 mg/dL (ref 8.4–10.4)
Chloride: 108 mEq/L (ref 98–109)
Creatinine: 0.8 mg/dL (ref 0.6–1.1)
EGFR: 90 mL/min/{1.73_m2} — ABNORMAL LOW (ref >90)
Glucose: 87 mg/dl (ref 70–140)
Potassium: 4.2 mEq/L (ref 3.5–5.1)
Sodium: 143 mEq/L (ref 136–145)
Total Bilirubin: 0.32 mg/dL (ref 0.20–1.20)
Total Protein: 7.1 g/dL (ref 6.4–8.3)

## 2016-09-22 LAB — CHROMOGRANIN A: Chromogranin A: 1 nmol/L (ref 0–5)

## 2016-09-23 ENCOUNTER — Telehealth: Payer: Self-pay | Admitting: *Deleted

## 2016-09-23 NOTE — Telephone Encounter (Signed)
"  The information for the 24-hour 5-HIAA urine I planned to begin tomorrow reads you cannot take acetaminophen.  This is a problem for me.  I have tension headaches down the side of my head, front of my head and down my neck.  I take Tylenol up to three times a day for tension headaches and Norco for pain not relieved by Tylenol,   I cannot take ibuprofen or aspirin because of my stomach problems.  What am I to do or use for my headaches?  I take a GI cocktail (Maalox, Lidocaine, Diclofenac), Zanaflex, Gabapentin.  Is it okay to take these medicines?  I can avoid the food with no problem.  Call me at [phone]."    Will notify provider.  Provided the following nursing instructions to try for tension headaches.  Medication should be avoided for at least 24 hours.  She says "I do not know what to do without the tylenol."  Music  Dark room  Alternate warm and cold compresses to painful areas  Massage these areas

## 2016-09-24 ENCOUNTER — Other Ambulatory Visit: Payer: Self-pay | Admitting: *Deleted

## 2016-09-24 MED ORDER — TRAMADOL HCL 50 MG PO TABS: 50.0000 mg | ORAL_TABLET | Freq: Three times a day (TID) | ORAL | 0 refills | Status: DC | PRN

## 2016-09-24 NOTE — Telephone Encounter (Signed)
Please call her back, ok for her to take the GI cocktail, zanaflex and gabapentin. Do not take tylenol. If she needs something for headache, ok to call in tramadol 50mg  every 8 hr prn 5 tab. Thanks   Truitt Merle MD

## 2016-09-29 ENCOUNTER — Encounter: Payer: Self-pay | Admitting: *Deleted

## 2016-09-29 NOTE — Progress Notes (Signed)
Per MD Burr Medico, RN called patient and informed her of lab results. Patient verbalized understanding.

## 2016-10-02 LAB — 5 HIAA, QUANTITATIVE, URINE, 24 HOUR

## 2016-10-08 ENCOUNTER — Telehealth: Payer: Self-pay | Admitting: Gastroenterology

## 2016-10-15 ENCOUNTER — Ambulatory Visit (INDEPENDENT_AMBULATORY_CARE_PROVIDER_SITE_OTHER): Payer: Medicare Other | Admitting: Orthopaedic Surgery

## 2016-10-19 ENCOUNTER — Ambulatory Visit (INDEPENDENT_AMBULATORY_CARE_PROVIDER_SITE_OTHER): Payer: Medicare Other | Admitting: Orthopaedic Surgery

## 2016-10-19 ENCOUNTER — Other Ambulatory Visit (INDEPENDENT_AMBULATORY_CARE_PROVIDER_SITE_OTHER): Payer: Self-pay

## 2016-10-19 DIAGNOSIS — G8929 Other chronic pain: Secondary | ICD-10-CM

## 2016-10-19 DIAGNOSIS — M5442 Lumbago with sciatica, left side: Secondary | ICD-10-CM

## 2016-10-19 DIAGNOSIS — M25562 Pain in left knee: Secondary | ICD-10-CM | POA: Diagnosis not present

## 2016-10-19 NOTE — Progress Notes (Signed)
The patient is someone who primarily ambulates with a walker. She has chronic left knee pain. We obtained an MRI early this year that showed some chondromalacia the patella but otherwise intact structures throughout her knee. We placed a hyaluronic acid injection 3 months ago. She says from time to time the knee feels like it wants to give out on her but otherwise she is doing well.  On examination of her left knee there is some patellofemoral crepitation but otherwise a normal knee exam.  I stressed her the points of quad training exercises to help with her knees. There is really nothing else that I would offer. She has had bilateral facet joint injections by Dr. Ernestina Patches at [facility] and she said that helped greatly. Should her back pain is started bothering her quite a bit again and she like to have those injections once again. I think this is reasonable at this point considering is been about 5 months or more since her last facet joint injections. We'll work on getting this set up. His far as her knee goes though she'll follow-up with me as needed.

## 2016-10-20 ENCOUNTER — Telehealth: Payer: Self-pay | Admitting: Gastroenterology

## 2016-10-20 NOTE — Telephone Encounter (Signed)
I spoke with the pharmacist. The prescription did not have amounts of the ingredients to be dispensed. Using the Rx given the patient will get 450 ml of her GI cocktail.

## 2016-10-22 NOTE — Telephone Encounter (Signed)
Im closing this note out today No one ever returned my call

## 2016-10-27 ENCOUNTER — Other Ambulatory Visit (HOSPITAL_COMMUNITY)
Admission: RE | Admit: 2016-10-27 | Discharge: 2016-10-27 | Disposition: A | Discharge status: Home / Self Care | Payer: Medicare Other | Source: Ambulatory Visit | Attending: Obstetrics & Gynecology | Admitting: Obstetrics & Gynecology

## 2016-10-27 ENCOUNTER — Encounter: Payer: Self-pay | Admitting: Obstetrics & Gynecology

## 2016-10-27 ENCOUNTER — Ambulatory Visit (INDEPENDENT_AMBULATORY_CARE_PROVIDER_SITE_OTHER): Payer: Medicare Other | Admitting: Obstetrics & Gynecology

## 2016-10-27 VITALS — BP 138/62 | HR 76 | Ht 62.0 in | Wt 134.2 lb

## 2016-10-27 DIAGNOSIS — R87810 Cervical high risk human papillomavirus (HPV) DNA test positive: Secondary | ICD-10-CM | POA: Diagnosis not present

## 2016-10-27 DIAGNOSIS — Z87410 Personal history of cervical dysplasia: Secondary | ICD-10-CM | POA: Insufficient documentation

## 2016-10-27 NOTE — Progress Notes (Signed)
History:  76 y.o. here today for f/u of abnormal PAP. Pt reports that she is not feeling well today. She reports that she feels tired today. She reviewed her h/o an appy since her last visit and her appt with [facility].     The following portions of the patient's history were reviewed and updated as appropriate: allergies, current medications, past family history, past medical history, past social history, past surgical history and problem list.  Review of Systems:  Pertinent items are noted in HPI.   Objective:  Physical Exam There were no vitals taken for this visit. Gen: NAD Abd: Soft, nontender and nondistended Pelvic: Normal appearing external genitalia; normal appearing vaginal mucosa and cervix.  Normal discharge.  Small uterus, no other palpable masses, no uterine or adnexal tenderness  Labs and Imaging 07/01/2015 Adequacy Reason Satisfactory for evaluation, endocervical/transformation zone component PRESENT. Diagnosis LOW GRADE SQUAMOUS INTRAEPITHELIAL LESION: CIN-1/ HPV (LSIL). Claudette Laws MD Pathologist, Electronic Signature (Case signed 07/03/2015) Source CervicoVaginal Pap [ThinPrep Imaged] Molecular Results Test Result HPV High Risk DETECTED  09/25/2015 Diagnosis Cervix, LEEP - MILD SQUAMOUS DYSPLASIA (CIN-I). - THE SURGICAL RESECTION MARGINS APPEAR NEGATIVE FOR SQUAMOUS DYSPLASIA. - SEE COMMENT. Assessment & Plan:  Cervical dysplasia- mild  F/u repeat PAP  Cleva Camero L. Harraway-Smith, M.D., Cherlynn June

## 2016-10-29 LAB — CYTOLOGY - PAP
Diagnosis: NEGATIVE
HPV: DETECTED — AB

## 2016-11-02 ENCOUNTER — Telehealth: Payer: Self-pay | Admitting: General Practice

## 2016-11-02 NOTE — Telephone Encounter (Signed)
Called patient and informed her of results & recommended follow up in one year. Patient verbalized understanding & asked if she needed to be seen in 6 months still for a pap or next June. Told patient she doesn't need another pap smear till next June. Patient verbalized understanding & had no questions

## 2016-11-02 NOTE — Telephone Encounter (Signed)
-----   Message from Lavonia Drafts, MD sent at 11/01/2016 11:31 AM EDT ----- Please call pt. Her PAP was neg HOWEVER< she will need to f/u in 1 year for a repeat PAP due to a +hrHPV and to f/u her prev abnormal PAP  Thx, clh-S

## 2016-11-09 ENCOUNTER — Encounter (INDEPENDENT_AMBULATORY_CARE_PROVIDER_SITE_OTHER): Payer: Medicare Other | Admitting: Physical Medicine and Rehabilitation

## 2016-11-11 ENCOUNTER — Ambulatory Visit (INDEPENDENT_AMBULATORY_CARE_PROVIDER_SITE_OTHER): Payer: Medicare Other | Admitting: Podiatry

## 2016-11-11 ENCOUNTER — Encounter: Payer: Self-pay | Admitting: Podiatry

## 2016-11-11 DIAGNOSIS — M79676 Pain in unspecified toe(s): Secondary | ICD-10-CM

## 2016-11-11 DIAGNOSIS — B351 Tinea unguium: Secondary | ICD-10-CM | POA: Diagnosis not present

## 2016-11-12 NOTE — Patient Instructions (Signed)

## 2016-11-12 NOTE — Progress Notes (Signed)
Patient ID: Emily [name], female   DOB: January 07, 1941, 76 y.o.   MRN: [id number]    Subjective: This patient presents today for scheduled visit complaining of uncomfortable toenails with walking wearing shoes and requests toenail debridement   Objective: Orientated 3 DP and PT pulses 2/4 bilaterally Capillary reflex immediate bilaterally Sensation to 10 g monofilament wire intact 3/5 right 4/5 left Vibratory sensation reactive bilaterally Ankle reflex equal and reactive bilaterally No open skin lesions bilaterally Dry scaling skin heals without fissures The toenails are elongated, hypertrophic, deformed, discolored and tender to direct palpation 6-10 The medial left hallux nail groove is callused and tender Pes planus bilaterally HAV bilaterally  Assessment: Diabetic with satisfactory neurovascular status Symptomatic onychomycoses 6-10 Callus medial nail groove left hallux Plan: Debridement toenails 6-10 mechanically electrically without any bleeding  Reappoint 3 months

## 2016-11-17 ENCOUNTER — Encounter (INDEPENDENT_AMBULATORY_CARE_PROVIDER_SITE_OTHER): Payer: Medicare Other | Admitting: Physical Medicine and Rehabilitation

## 2016-11-19 ENCOUNTER — Ambulatory Visit (INDEPENDENT_AMBULATORY_CARE_PROVIDER_SITE_OTHER): Payer: Medicare Other | Admitting: Physical Medicine and Rehabilitation

## 2016-11-19 ENCOUNTER — Ambulatory Visit (INDEPENDENT_AMBULATORY_CARE_PROVIDER_SITE_OTHER): Payer: Medicare Other

## 2016-11-19 ENCOUNTER — Encounter (INDEPENDENT_AMBULATORY_CARE_PROVIDER_SITE_OTHER): Payer: Self-pay | Admitting: Physical Medicine and Rehabilitation

## 2016-11-19 VITALS — BP 132/84 | HR 75 | Temp 98.5°F

## 2016-11-19 DIAGNOSIS — M47816 Spondylosis without myelopathy or radiculopathy, lumbar region: Secondary | ICD-10-CM

## 2016-11-19 DIAGNOSIS — M545 Low back pain, unspecified: Secondary | ICD-10-CM

## 2016-11-19 DIAGNOSIS — G8929 Other chronic pain: Secondary | ICD-10-CM

## 2016-11-19 MED ORDER — METHYLPREDNISOLONE ACETATE 80 MG/ML IJ SUSP: 80.0000 mg | Freq: Once | INTRAMUSCULAR | Status: DC

## 2016-11-19 MED ORDER — LIDOCAINE HCL (PF) 1 % IJ SOLN: 2.0000 mL | Freq: Once | INTRAMUSCULAR | Status: DC

## 2016-11-19 NOTE — Patient Instructions (Signed)

## 2016-11-19 NOTE — Progress Notes (Signed)
Increased pain across lower back for around 2 months. Right=left. States she did well with last injection and was pretty much pain free for 2 to 3 months after injection she had in January. No leg pain. Ambulates with walker.

## 2016-11-20 NOTE — Procedures (Signed)
Emily [name] a 76 year old female who comes in today with history of axial low back pain for around 2 months. Prior injection was a bilateral L5 facet joint block. She follows her care with Dr. Ninfa Linden from an orthopedic standpoint. She says she had 3 months of almost pain-free relief of her back pain after the injection. She denies any leg pain or paresthesias. She does use a walker for ambulation but has good distal strength. 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 We will repeat the injection today since she did so well. She may be a candidate for radiofrequency ablation. We would look a double diagnostic blocks and regrouping with therapy if needed.  Lumbar Facet Joint Intra-Articular Injection(s) with Fluoroscopic Guidance  Patient: Emily [name]      Date of Birth: December 16, 1940 MRN: [id number] PCP: Nolene Ebbs, MD      Visit Date: 11/19/2016   Universal Protocol:    Date/Time: 07/20/186:47 AM  Consent Given By: the patient  Position: PRONE   Additional Comments: Vital signs were monitored before and after the procedure. Patient was prepped and draped in the usual sterile fashion. The correct patient, procedure, and site was verified.   Injection Procedure Details:  Procedure Site One Meds Administered:  Meds ordered this encounter  Medications  . lidocaine (PF) (XYLOCAINE) 1 % injection 2 mL  . methylPREDNISolone acetate (DEPO-MEDROL) injection 80 mg     Laterality: Bilateral  Location/Site:  L5-S1  Needle size: 22 guage  Needle type: Spinal  Needle Placement: Articular  Findings:  -Contrast Used: 1 mL iohexol 180 mg iodine/mL   -Comments: Excellent flow of contrast producing a partial arthrogram.  Procedure Details: The fluoroscope beam is vertically oriented in AP, and the inferior recess is visualized beneath the lower pole of the inferior apophyseal process, which represents the target point for needle insertion. When direct  visualization is difficult the target point is located at the medial projection of the vertebral pedicle. The region overlying each aforementioned target is locally anesthetized with a 1 to 2 ml. volume of 1% Lidocaine without Epinephrine.   The spinal needle was inserted into each of the above mentioned facet joints using biplanar fluoroscopic guidance. A 0.25 to 0.5 ml. volume of Isovue-250 was injected and a partial facet joint arthrogram was obtained. A single spot film was obtained of the resulting arthrogram.    One to 1.25 ml of the steroid/anesthetic solution was then injected into each of the facet joints noted above.   Additional Comments:  The patient tolerated the procedure well Dressing: Band-Aid    Post-procedure details: Patient was observed during the procedure. Post-procedure instructions were reviewed.  Patient left the clinic in stable condition.

## 2016-12-11 ENCOUNTER — Other Ambulatory Visit (HOSPITAL_BASED_OUTPATIENT_CLINIC_OR_DEPARTMENT_OTHER): Payer: Medicare Other

## 2016-12-11 DIAGNOSIS — C7A8 Other malignant neuroendocrine tumors: Secondary | ICD-10-CM | POA: Diagnosis not present

## 2016-12-11 LAB — CBC WITH DIFFERENTIAL/PLATELET
BASO%: 0.1 % (ref 0.0–2.0)
Basophils Absolute: 0 10*3/uL (ref 0.0–0.1)
EOS%: 1 % (ref 0.0–7.0)
Eosinophils Absolute: 0.1 10*3/uL (ref 0.0–0.5)
HCT: 35.5 % (ref 34.8–46.6)
HGB: 11.3 g/dL — ABNORMAL LOW (ref 11.6–15.9)
LYMPH%: 28.2 % (ref 14.0–49.7)
MCH: 24.5 pg — ABNORMAL LOW (ref 25.1–34.0)
MCHC: 31.8 g/dL (ref 31.5–36.0)
MCV: 77 fL — ABNORMAL LOW (ref 79.5–101.0)
MONO#: 0.6 10*3/uL (ref 0.1–0.9)
MONO%: 7.6 % (ref 0.0–14.0)
NEUT#: 5.2 10*3/uL (ref 1.5–6.5)
NEUT%: 63.1 % (ref 38.4–76.8)
Platelets: 196 10*3/uL (ref 145–400)
RBC: 4.61 10*6/uL (ref 3.70–5.45)
RDW: 14.9 % — ABNORMAL HIGH (ref 11.2–14.5)
WBC: 8.3 10*3/uL (ref 3.9–10.3)
lymph#: 2.3 10*3/uL (ref 0.9–3.3)

## 2016-12-11 LAB — COMPREHENSIVE METABOLIC PANEL
ALT: 44 U/L (ref 0–55)
AST: 29 U/L (ref 5–34)
Albumin: 3.8 g/dL (ref 3.5–5.0)
Alkaline Phosphatase: 94 U/L (ref 40–150)
Anion Gap: 8 mEq/L (ref 3–11)
BUN: 21.9 mg/dL (ref 7.0–26.0)
CO2: 23 mEq/L (ref 22–29)
Calcium: 9 mg/dL (ref 8.4–10.4)
Chloride: 109 mEq/L (ref 98–109)
Creatinine: 0.9 mg/dL (ref 0.6–1.1)
EGFR: 77 mL/min/{1.73_m2} — ABNORMAL LOW (ref >90)
Glucose: 90 mg/dl (ref 70–140)
Potassium: 4.5 mEq/L (ref 3.5–5.1)
Sodium: 140 mEq/L (ref 136–145)
Total Bilirubin: 0.35 mg/dL (ref 0.20–1.20)
Total Protein: 7 g/dL (ref 6.4–8.3)

## 2016-12-12 IMAGING — CT CT ABD-PELV W/ CM
2 of 5 series · 15 of 46 positions shown, 17 images · IV contrast (OMNIPAQUE 300)
Comparison: CT abdomen and pelvis 02/23/2015 and 10/20/2013. CT
chest 05/12/2009.

CLINICAL DATA: Episodic abdominal pain for 1 month, worse in the
morning. No known injury. Initial encounter.

EXAM:
CT ABDOMEN AND PELVIS WITH CONTRAST
TECHNIQUE: Multidetector CT imaging of the abdomen and pelvis was performed
using the standard protocol following bolus administration of
intravenous contrast.
CONTRAST:  25 mL OMNIPAQUE IOHEXOL 300 MG/ML SOLN, 100 mL [id number]-XHH
IOPAMIDOL (F3YX7W-XHH) INJECTION 61%

[Series 2: abd/pel with · axial · 0.69mm/px · z∈[+855,+1270]mm · 12 of 93 slices shown, 14 images]
[im 5/93  soft-tissue]
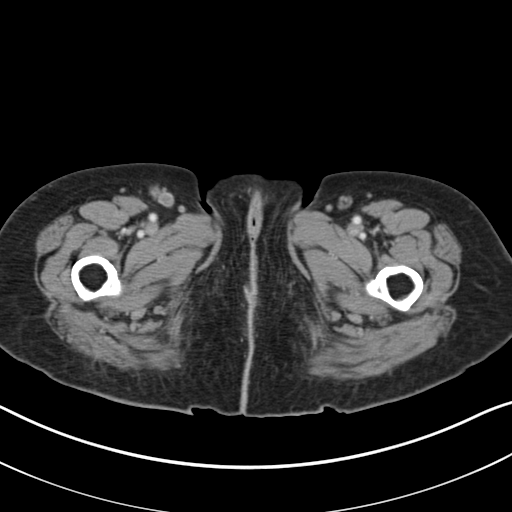
[im 5/93  bone]
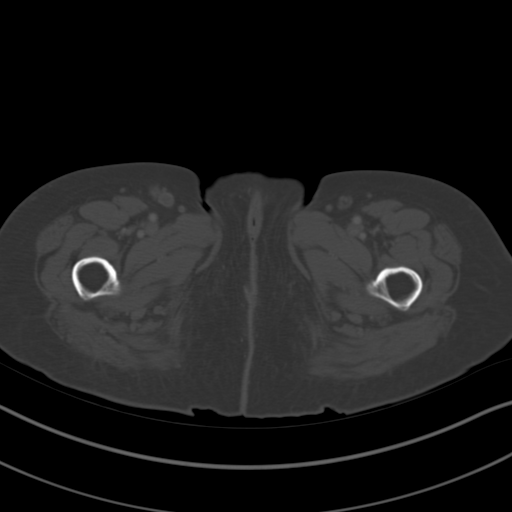
[im 15/93  soft-tissue]
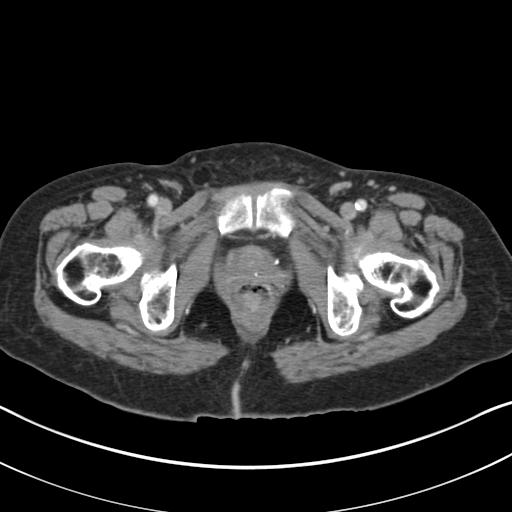
[im 20/93  soft-tissue]
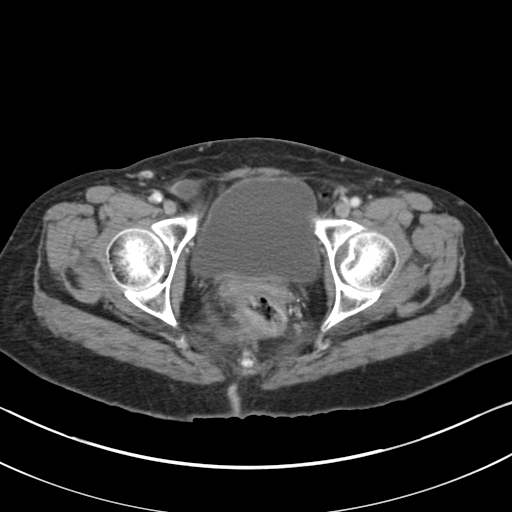
[im 30/93  soft-tissue]
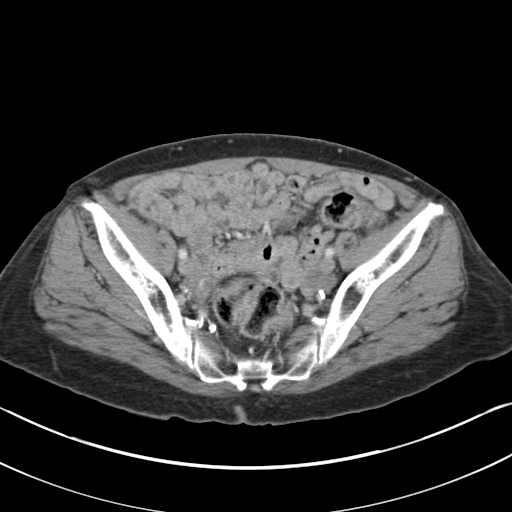
[im 34/93  soft-tissue]
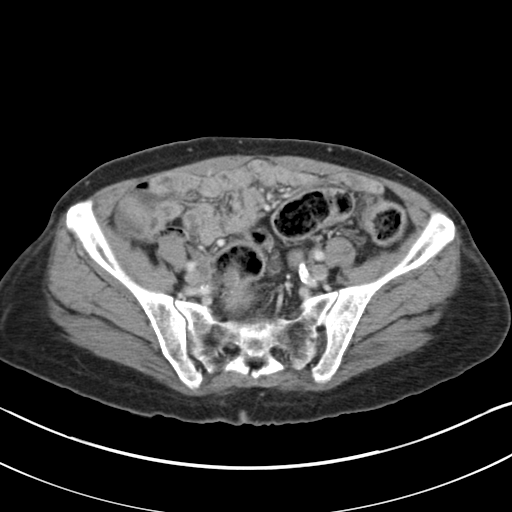
[im 44/93  soft-tissue]
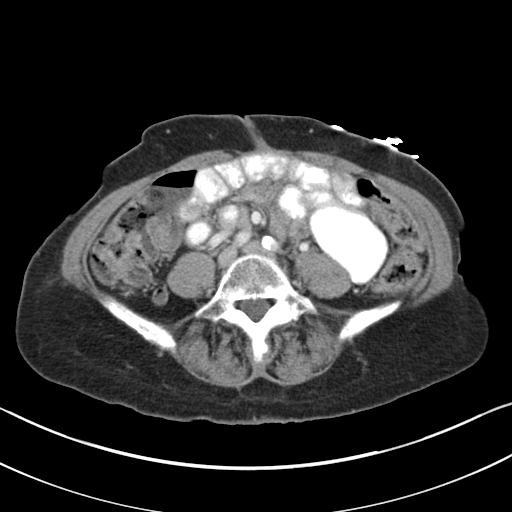
[im 49/93  soft-tissue]
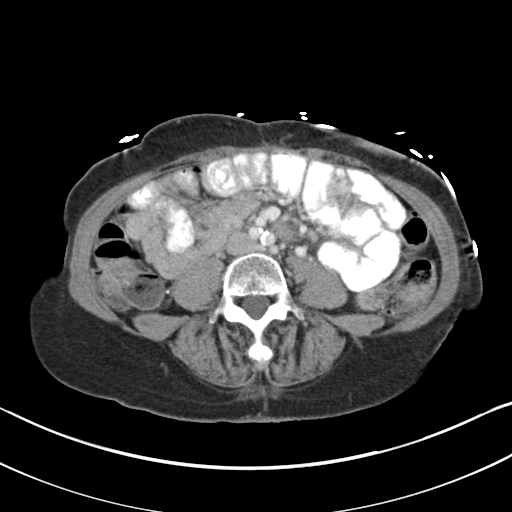
[im 59/93  soft-tissue]
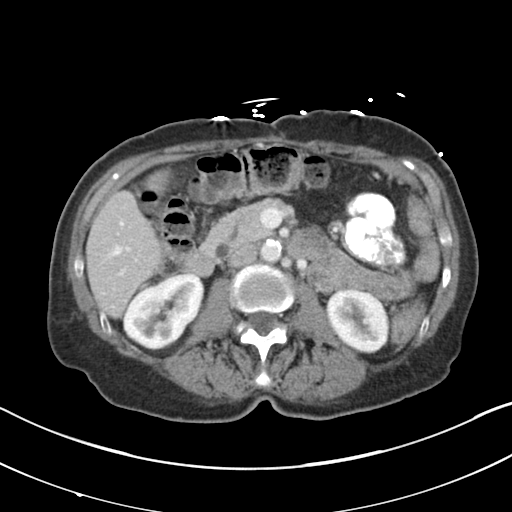
[im 63/93  soft-tissue]
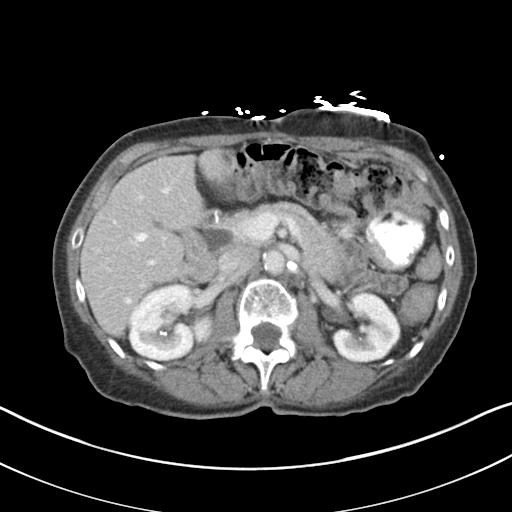
[im 63/93  bone]
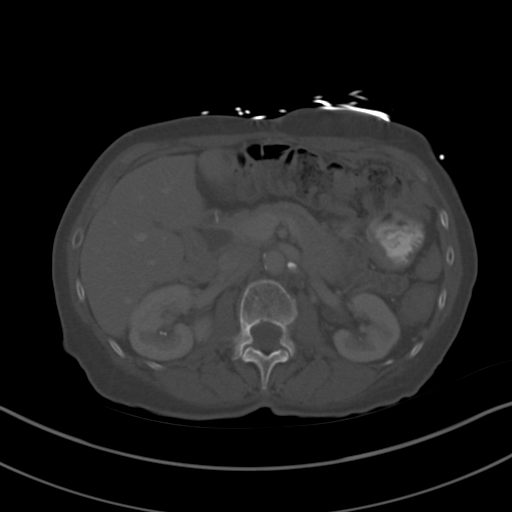
[im 73/93  soft-tissue]
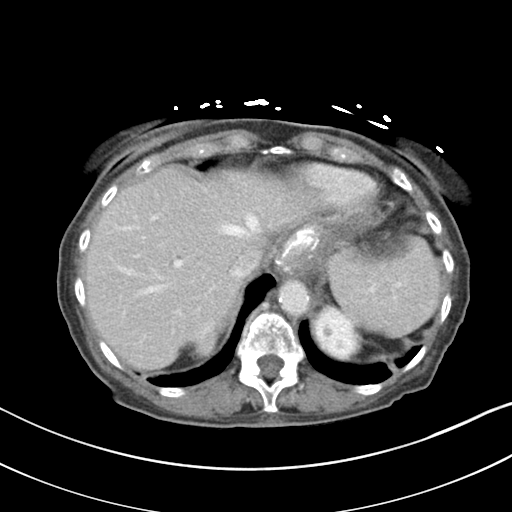
[im 78/93  soft-tissue]
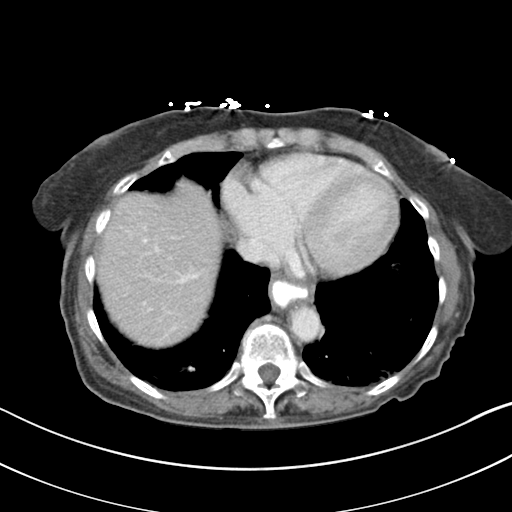
[im 88/93  soft-tissue]
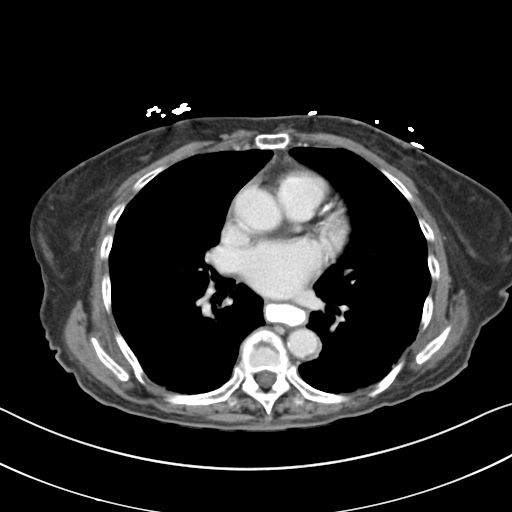

[Series 5: coronal a/|p · coronal · 0.63mm/px · 3 of 73 slices shown]
[im 25/73  soft-tissue]
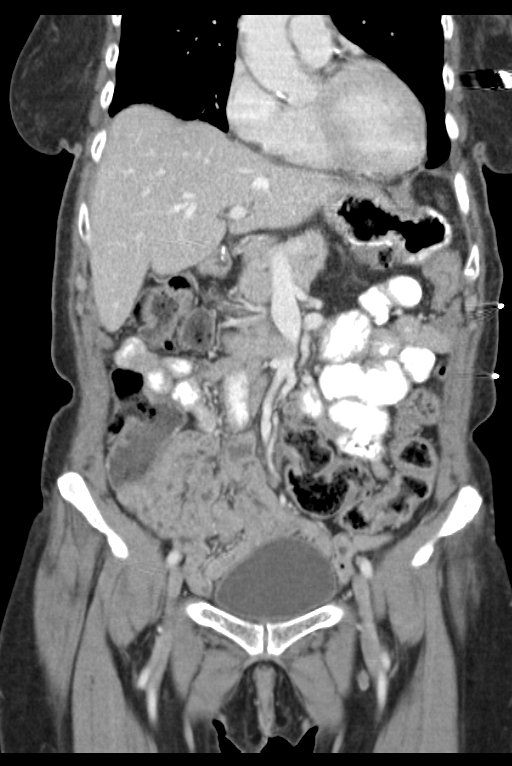
[im 33/73  soft-tissue]
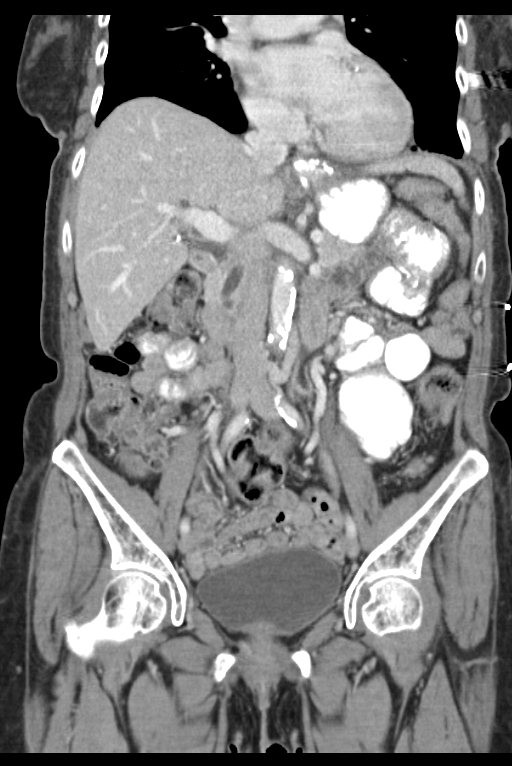
[im 41/73  soft-tissue]
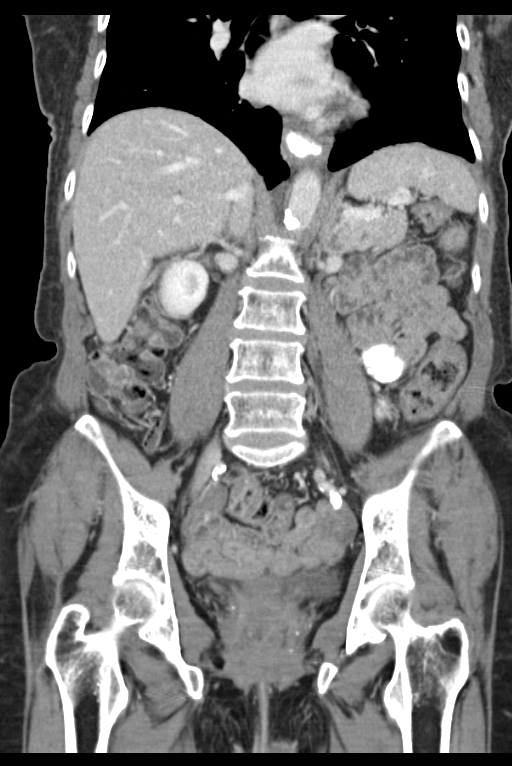

[15 of 46 positions shown; findings below may reference images not displayed]

FINDINGS: A 0.8 cm right upper lobe nodule on image 3 is unchanged since 0399.
Mild dependent atelectasis is present in the lung bases. No pleural
or pericardial effusion. Calcific aortic and coronary
atherosclerosis is identified. Contrast is present in the distal
esophagus consistent with poor motility and/or reflux. Small hiatal
hernia is identified.

2-3 punctate nonobstructing stones are seen in each kidney. Small
right renal cysts are noted. The gallbladder has been removed. The
liver is unremarkable. The adrenal glands, spleen and pancreas
appear normal. Mild prominence of the common bile duct is unchanged
and likely related to the patient's age and gallbladder removal.

The stomach, small and large bowel and appendix appear normal.
Uterus and adnexa are unremarkable. No lymphadenopathy or fluid.

No focal bony abnormality.
IMPRESSION: No acute abnormality or finding to explain the patient's symptoms.

Calcific aortic and coronary atherosclerosis.

Small hiatal hernia.

Contrast in the esophagus is consistent with poor motility and/or
reflux disease.

Punctate nonobstructing bilateral renal stones.

## 2016-12-15 LAB — CHROMOGRANIN A: Chromogranin A: 2 nmol/L (ref 0–5)
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
## 2016-12-17 LAB — 5 HIAA, QUANTITATIVE, URINE, 24 HOUR
5-HIAA, Urine: 4.4 mg/L
5-HIAA,Quant.,24 Hr Urine: 3.1 mg/24 hr (ref 0.0–14.9)

## 2016-12-18 ENCOUNTER — Ambulatory Visit (HOSPITAL_BASED_OUTPATIENT_CLINIC_OR_DEPARTMENT_OTHER): Payer: Medicare Other | Admitting: Hematology

## 2016-12-18 ENCOUNTER — Telehealth: Payer: Self-pay | Admitting: Hematology

## 2016-12-18 VITALS — BP 145/65 | HR 90 | Temp 99.0°F | Resp 18 | Ht 62.0 in | Wt 137.0 lb

## 2016-12-18 DIAGNOSIS — R079 Chest pain, unspecified: Secondary | ICD-10-CM | POA: Diagnosis not present

## 2016-12-18 DIAGNOSIS — C7A8 Other malignant neuroendocrine tumors: Secondary | ICD-10-CM | POA: Diagnosis not present

## 2016-12-18 DIAGNOSIS — Z720 Tobacco use: Secondary | ICD-10-CM

## 2016-12-18 NOTE — Telephone Encounter (Signed)
Gave pt avs and calendar for appts sched in feb of 2019.

## 2016-12-19 ENCOUNTER — Encounter: Payer: Self-pay | Admitting: Hematology

## 2016-12-29 ENCOUNTER — Other Ambulatory Visit (HOSPITAL_COMMUNITY): Payer: Self-pay | Admitting: Internal Medicine

## 2016-12-29 DIAGNOSIS — M79604 Pain in right leg: Secondary | ICD-10-CM

## 2016-12-29 DIAGNOSIS — M79605 Pain in left leg: Principal | ICD-10-CM

## 2016-12-30 ENCOUNTER — Ambulatory Visit (HOSPITAL_COMMUNITY)
Admission: RE | Admit: 2016-12-30 | Discharge: 2016-12-30 | Disposition: A | Discharge status: Home / Self Care | Payer: Medicare Other | Source: Ambulatory Visit | Attending: Vascular Surgery | Admitting: Vascular Surgery

## 2016-12-30 DIAGNOSIS — M79605 Pain in left leg: Secondary | ICD-10-CM | POA: Diagnosis present

## 2016-12-30 DIAGNOSIS — I82412 Acute embolism and thrombosis of left femoral vein: Secondary | ICD-10-CM | POA: Diagnosis not present

## 2016-12-30 DIAGNOSIS — M79604 Pain in right leg: Secondary | ICD-10-CM

## 2017-01-11 ENCOUNTER — Other Ambulatory Visit: Payer: Self-pay | Admitting: Gastroenterology

## 2017-01-18 ENCOUNTER — Ambulatory Visit (INDEPENDENT_AMBULATORY_CARE_PROVIDER_SITE_OTHER): Payer: Medicare Other | Admitting: Orthopaedic Surgery

## 2017-01-18 DIAGNOSIS — M79661 Pain in right lower leg: Secondary | ICD-10-CM

## 2017-01-18 DIAGNOSIS — M25561 Pain in right knee: Secondary | ICD-10-CM | POA: Diagnosis not present

## 2017-01-18 DIAGNOSIS — G8929 Other chronic pain: Secondary | ICD-10-CM

## 2017-01-18 MED ORDER — LIDOCAINE HCL 1 % IJ SOLN
3.0000 mL | INTRAMUSCULAR | Status: AC | PRN
  Administered 2017-01-18: 3 mL

## 2017-01-18 MED ORDER — METHYLPREDNISOLONE ACETATE 40 MG/ML IJ SUSP
40.0000 mg | INTRAMUSCULAR | Status: AC | PRN
  Administered 2017-01-18: 40 mg via INTRA_ARTICULAR
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
## 2017-02-08 ENCOUNTER — Ambulatory Visit: Payer: Medicare Other | Admitting: Podiatry

## 2017-02-15 ENCOUNTER — Ambulatory Visit (INDEPENDENT_AMBULATORY_CARE_PROVIDER_SITE_OTHER): Payer: Medicare Other | Admitting: Podiatry

## 2017-02-15 DIAGNOSIS — B351 Tinea unguium: Secondary | ICD-10-CM | POA: Diagnosis not present

## 2017-02-15 DIAGNOSIS — M79676 Pain in unspecified toe(s): Secondary | ICD-10-CM | POA: Diagnosis not present

## 2017-02-15 DIAGNOSIS — E0842 Diabetes mellitus due to underlying condition with diabetic polyneuropathy: Secondary | ICD-10-CM
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
## 2017-03-05 ENCOUNTER — Telehealth: Payer: Self-pay | Admitting: Gastroenterology

## 2017-03-05 NOTE — Telephone Encounter (Signed)
Pt states [employer] wants our office to call this number [phone] and explain what is in gi cocktail medication, to see if this company can get it for the patient for less than she is paying now.

## 2017-03-08 NOTE — Telephone Encounter (Signed)
Gi Cocktail   90 ML Lidocaine 2%  90 ML Dicyclomine 10 mg/5 ml  270 ml Maalox  = 450 Ml's  30cc's every 4-6 hours or 5-10 ml's   I called the  number provided  They said they could not assist me with any information because this patient does not have an account established with them  They are not a compounding pharmacy anyway, Will contact patient to inform   [facility] and I called Number provided and I contacted patient [facility] does not compound GI Cocktail , tried to explain this to the patient  She said she would contact [facility] tomorrow, She paid $44 dollars for her last prescription in June. The prescription last her 90 days  The rx is now $58  Which is $19 a month  Insurance will not cover med because its compounded

## 2017-04-08 ENCOUNTER — Other Ambulatory Visit (HOSPITAL_COMMUNITY): Payer: Self-pay | Admitting: Internal Medicine

## 2017-04-08 ENCOUNTER — Ambulatory Visit (HOSPITAL_COMMUNITY)
Admission: RE | Admit: 2017-04-08 | Discharge: 2017-04-08 | Disposition: A | Discharge status: Home / Self Care | Payer: Medicare Other | Source: Ambulatory Visit | Attending: Internal Medicine | Admitting: Internal Medicine

## 2017-04-08 DIAGNOSIS — J40 Bronchitis, not specified as acute or chronic: Secondary | ICD-10-CM | POA: Diagnosis not present

## 2017-04-08 DIAGNOSIS — R0789 Other chest pain: Secondary | ICD-10-CM

## 2017-04-08 DIAGNOSIS — F17210 Nicotine dependence, cigarettes, uncomplicated: Secondary | ICD-10-CM

## 2017-04-08 DIAGNOSIS — F17200 Nicotine dependence, unspecified, uncomplicated: Secondary | ICD-10-CM | POA: Insufficient documentation

## 2017-04-28 ENCOUNTER — Emergency Department (HOSPITAL_COMMUNITY): Payer: Medicare Other

## 2017-04-28 ENCOUNTER — Emergency Department (HOSPITAL_COMMUNITY)
Admission: EM | Admit: 2017-04-28 | Discharge: 2017-04-29 | Disposition: A | Discharge status: Home / Self Care | Payer: Medicare Other | Attending: Emergency Medicine | Admitting: Emergency Medicine

## 2017-04-28 ENCOUNTER — Encounter (HOSPITAL_COMMUNITY): Payer: Self-pay | Admitting: Emergency Medicine

## 2017-04-28 DIAGNOSIS — E119 Type 2 diabetes mellitus without complications: Secondary | ICD-10-CM | POA: Diagnosis not present

## 2017-04-28 DIAGNOSIS — Z7982 Long term (current) use of aspirin: Secondary | ICD-10-CM | POA: Diagnosis not present

## 2017-04-28 DIAGNOSIS — R1084 Generalized abdominal pain: Secondary | ICD-10-CM | POA: Insufficient documentation

## 2017-04-28 DIAGNOSIS — Z9049 Acquired absence of other specified parts of digestive tract: Secondary | ICD-10-CM | POA: Insufficient documentation

## 2017-04-28 DIAGNOSIS — Z79899 Other long term (current) drug therapy: Secondary | ICD-10-CM | POA: Insufficient documentation

## 2017-04-28 DIAGNOSIS — R1013 Epigastric pain: Secondary | ICD-10-CM | POA: Diagnosis present

## 2017-04-28 DIAGNOSIS — F17210 Nicotine dependence, cigarettes, uncomplicated: Secondary | ICD-10-CM | POA: Diagnosis not present

## 2017-04-28 DIAGNOSIS — Z9884 Bariatric surgery status: Secondary | ICD-10-CM | POA: Diagnosis not present

## 2017-04-28 DIAGNOSIS — R1011 Right upper quadrant pain: Secondary | ICD-10-CM | POA: Insufficient documentation

## 2017-04-28 DIAGNOSIS — I10 Essential (primary) hypertension: Secondary | ICD-10-CM | POA: Diagnosis not present

## 2017-04-28 DIAGNOSIS — R112 Nausea with vomiting, unspecified: Secondary | ICD-10-CM | POA: Diagnosis not present

## 2017-04-28 LAB — COMPREHENSIVE METABOLIC PANEL
ALT: 31 U/L (ref 14–54)
AST: 48 U/L — ABNORMAL HIGH (ref 15–41)
Albumin: 3.9 g/dL (ref 3.5–5.0)
Alkaline Phosphatase: 92 U/L (ref 38–126)
Anion gap: 7 (ref 5–15)
BUN: 15 mg/dL (ref 6–20)
CO2: 25 mmol/L (ref 22–32)
Calcium: 9.2 mg/dL (ref 8.9–10.3)
Chloride: 107 mmol/L (ref 101–111)
Creatinine, Ser: 0.72 mg/dL (ref 0.44–1.00)
GFR calc Af Amer: >60 mL/min (ref >60)
GFR calc non Af Amer: >60 mL/min (ref >60)
Glucose, Bld: 88 mg/dL (ref 65–99)
Potassium: 3.9 mmol/L (ref 3.5–5.1)
Sodium: 139 mmol/L (ref 135–145)
Total Bilirubin: 0.7 mg/dL (ref 0.3–1.2)
Total Protein: 7 g/dL (ref 6.5–8.1)

## 2017-04-28 LAB — URINALYSIS, ROUTINE W REFLEX MICROSCOPIC
Bilirubin Urine: NEGATIVE
Glucose, UA: NEGATIVE mg/dL
Hgb urine dipstick: NEGATIVE
Ketones, ur: NEGATIVE mg/dL
Leukocytes, UA: NEGATIVE
Nitrite: NEGATIVE
Protein, ur: NEGATIVE mg/dL
Specific Gravity, Urine: 1.025 (ref 1.005–1.030)
pH: 6 (ref 5.0–8.0)

## 2017-04-28 LAB — CBC
HCT: 38.2 % (ref 36.0–46.0)
Hemoglobin: 12.2 g/dL (ref 12.0–15.0)
MCH: 24.4 pg — ABNORMAL LOW (ref 26.0–34.0)
MCHC: 31.9 g/dL (ref 30.0–36.0)
MCV: 76.2 fL — ABNORMAL LOW (ref 78.0–100.0)
Platelets: 204 10*3/uL (ref 150–400)
RBC: 5.01 MIL/uL (ref 3.87–5.11)
RDW: 14.8 % (ref 11.5–15.5)
WBC: 7.6 10*3/uL (ref 4.0–10.5)

## 2017-04-28 LAB — LIPASE, BLOOD: Lipase: 30 U/L (ref 11–51)

## 2017-04-28 LAB — CBG MONITORING, ED: Glucose-Capillary: 80 mg/dL (ref 65–99)

## 2017-04-28 MED ORDER — ONDANSETRON HCL 4 MG/2ML IJ SOLN
4.0000 mg | Freq: Once | INTRAMUSCULAR | Status: AC
  Administered 2017-04-29: 4 mg via INTRAVENOUS
  Filled 2017-04-28 (×2): qty 2

## 2017-04-28 MED ORDER — SODIUM CHLORIDE 0.9 % IV BOLUS (SEPSIS)
1000.0000 mL | Freq: Once | INTRAVENOUS | Status: AC
  Administered 2017-04-28: 1000 mL via INTRAVENOUS

## 2017-04-28 MED ORDER — FAMOTIDINE IN NACL 20-0.9 MG/50ML-% IV SOLN
20.0000 mg | Freq: Once | INTRAVENOUS | Status: AC
  Administered 2017-04-28: 20 mg via INTRAVENOUS
  Filled 2017-04-28: qty 50

## 2017-04-28 NOTE — ED Notes (Signed)
Pt is extremely hard stick, tried to do Ultra sound here in the ED with no success. This nurse put in consult to IV Team at [facility]

## 2017-04-28 NOTE — Progress Notes (Signed)
Arrived to ER for vascular access assessment. Nurse at bedside and had already successfully placed IV, gaining vascular access. Fran Lowes, RN VAST
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
## 2017-04-28 NOTE — ED Triage Notes (Signed)
Pt comes in with complaints of generalized abdominal pain that spreads to her epigastric region.  States this has occurred for the past month.  Endorses sharp pains. Denies diarrhea. Endorses emesis.  Hx of GERD and abdominal surgery.

## 2017-04-29 ENCOUNTER — Emergency Department (HOSPITAL_COMMUNITY): Payer: Medicare Other

## 2017-04-29 DIAGNOSIS — R1013 Epigastric pain: Secondary | ICD-10-CM | POA: Diagnosis not present

## 2017-04-29 MED ORDER — SUCRALFATE 1 GM/10ML PO SUSP: 1.0000 g | Freq: Three times a day (TID) | ORAL | 0 refills | Status: DC

## 2017-04-29 MED ORDER — IOPAMIDOL (ISOVUE-300) INJECTION 61%
INTRAVENOUS | Status: AC
Start: 2017-04-29 — End: 2017-04-29
  Administered 2017-04-29: 30 mL via ORAL
  Filled 2017-04-29: qty 30

## 2017-04-29 MED ORDER — ACETAMINOPHEN 325 MG PO TABS
650.0000 mg | ORAL_TABLET | Freq: Once | ORAL | Status: AC
  Administered 2017-04-29: 650 mg via ORAL
  Filled 2017-04-29: qty 2

## 2017-04-29 MED ORDER — GI COCKTAIL ~~LOC~~
30.0000 mL | Freq: Once | ORAL | Status: AC
  Administered 2017-04-29: 30 mL via ORAL
  Filled 2017-04-29: qty 30

## 2017-04-29 MED ORDER — RANITIDINE HCL 150 MG PO TABS: 150.0000 mg | ORAL_TABLET | Freq: Two times a day (BID) | ORAL | 0 refills | Status: DC

## 2017-04-29 MED ORDER — IOPAMIDOL (ISOVUE-300) INJECTION 61%
30.0000 mL | Freq: Once | INTRAVENOUS | Status: AC | PRN
  Administered 2017-04-29: 30 mL via ORAL

## 2017-04-29 MED ORDER — IOPAMIDOL (ISOVUE-300) INJECTION 61%
INTRAVENOUS | Status: AC
  Filled 2017-04-29: qty 100

## 2017-04-29 NOTE — ED Notes (Signed)
Patient transported to CT 
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
## 2017-04-29 NOTE — Discharge Instructions (Signed)
1. Medications: ranitidine, carafate, usual home medications 2. Treatment: rest, drink plenty of fluids, advance diet slowly 3. Follow Up: Please followup with your primary doctor in 2 days for discussion of your diagnoses and further evaluation after today's visit; if you do not have a primary care doctor use the resource guide provided to find one; Please return to the ER for persistent vomiting, high fevers or worsening symptoms

## 2017-05-06 ENCOUNTER — Ambulatory Visit: Payer: Medicare Other | Admitting: Gastroenterology

## 2017-05-13 ENCOUNTER — Ambulatory Visit (INDEPENDENT_AMBULATORY_CARE_PROVIDER_SITE_OTHER): Payer: Medicare Other | Admitting: Gastroenterology

## 2017-05-13 ENCOUNTER — Encounter: Payer: Self-pay | Admitting: Gastroenterology

## 2017-05-13 VITALS — BP 146/60 | HR 88 | Ht 61.81 in | Wt 144.4 lb

## 2017-05-13 DIAGNOSIS — K219 Gastro-esophageal reflux disease without esophagitis: Secondary | ICD-10-CM

## 2017-05-13 DIAGNOSIS — K5900 Constipation, unspecified: Secondary | ICD-10-CM

## 2017-05-13 DIAGNOSIS — R1013 Epigastric pain: Secondary | ICD-10-CM

## 2017-05-13 MED ORDER — GLYCOPYRROLATE 1.5 MG PO TABS: 1.0000 | ORAL_TABLET | Freq: Two times a day (BID) | ORAL | 0 refills | Status: DC | PRN

## 2017-05-13 MED ORDER — LOPERAMIDE HCL 2 MG PO TABS: 2.0000 mg | ORAL_TABLET | ORAL | 1 refills | Status: DC | PRN

## 2017-05-13 MED ORDER — SUCRALFATE 1 GM/10ML PO SUSP: 1.0000 g | Freq: Three times a day (TID) | ORAL | 3 refills | Status: DC

## 2017-05-13 MED ORDER — RANITIDINE HCL 150 MG PO TABS: 150.0000 mg | ORAL_TABLET | Freq: Every day | ORAL | 3 refills | Status: DC

## 2017-05-13 MED ORDER — LINACLOTIDE 145 MCG PO CAPS: 145.0000 ug | ORAL_CAPSULE | Freq: Every day | ORAL | 0 refills | Status: DC

## 2017-05-13 NOTE — Patient Instructions (Signed)
Carafate liquid suspension with meals and at bedtime.   Zantac 150 mg at bedtime.   Imodium 2 mg as needed   Linzess 145 mcg daily. We have given you some samples.   Stop Bentyl.   Glycate 1.5 mg twice a day as needed for abdominal pain.
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
## 2017-05-24 ENCOUNTER — Other Ambulatory Visit: Payer: Self-pay | Admitting: Gastroenterology

## 2017-05-24 ENCOUNTER — Ambulatory Visit: Payer: Medicare Other | Admitting: Podiatry

## 2017-05-27 ENCOUNTER — Other Ambulatory Visit: Payer: Self-pay | Admitting: Gastroenterology

## 2017-05-31 ENCOUNTER — Encounter: Payer: Self-pay | Admitting: Podiatry

## 2017-05-31 ENCOUNTER — Ambulatory Visit (INDEPENDENT_AMBULATORY_CARE_PROVIDER_SITE_OTHER): Payer: Medicare Other | Admitting: Podiatry

## 2017-05-31 ENCOUNTER — Telehealth: Payer: Self-pay | Admitting: Hematology

## 2017-05-31 DIAGNOSIS — B351 Tinea unguium: Secondary | ICD-10-CM | POA: Diagnosis not present

## 2017-05-31 DIAGNOSIS — M79676 Pain in unspecified toe(s): Secondary | ICD-10-CM | POA: Diagnosis not present

## 2017-05-31 NOTE — Telephone Encounter (Signed)
Left message for patient regarding rescheduling her lab appointment per 1/28 sched msg

## 2017-06-09 ENCOUNTER — Encounter: Payer: Self-pay | Admitting: Gastroenterology

## 2017-06-18 ENCOUNTER — Other Ambulatory Visit: Payer: Medicare Other

## 2017-06-18 ENCOUNTER — Ambulatory Visit (HOSPITAL_COMMUNITY): Payer: Medicare Other

## 2017-06-18 ENCOUNTER — Telehealth: Payer: Self-pay | Admitting: *Deleted

## 2017-06-18 NOTE — Telephone Encounter (Signed)
Received message from radiology that pt moved appt to 2/20 & was scheduled for labs today.  Pt has not shown up & will need labs r/s.  Left vm at daughter's # to call back to r/s labs.

## 2017-06-19 ENCOUNTER — Emergency Department (HOSPITAL_COMMUNITY): Payer: Medicare Other

## 2017-06-19 ENCOUNTER — Encounter (HOSPITAL_COMMUNITY): Payer: Self-pay

## 2017-06-19 ENCOUNTER — Inpatient Hospital Stay (HOSPITAL_COMMUNITY)
Admission: EM | Admit: 2017-06-19 | Discharge: 2017-06-23 | DRG: 392 | Disposition: A | Discharge status: Home Health | Payer: Medicare Other | Attending: Internal Medicine | Admitting: Internal Medicine

## 2017-06-19 DIAGNOSIS — K2920 Alcoholic gastritis without bleeding: Secondary | ICD-10-CM | POA: Diagnosis not present

## 2017-06-19 DIAGNOSIS — I10 Essential (primary) hypertension: Secondary | ICD-10-CM | POA: Diagnosis not present

## 2017-06-19 DIAGNOSIS — E876 Hypokalemia: Secondary | ICD-10-CM | POA: Diagnosis present

## 2017-06-19 DIAGNOSIS — K580 Irritable bowel syndrome with diarrhea: Secondary | ICD-10-CM | POA: Diagnosis present

## 2017-06-19 DIAGNOSIS — F1010 Alcohol abuse, uncomplicated: Secondary | ICD-10-CM | POA: Diagnosis present

## 2017-06-19 DIAGNOSIS — D649 Anemia, unspecified: Secondary | ICD-10-CM | POA: Diagnosis present

## 2017-06-19 DIAGNOSIS — Y838 Other surgical procedures as the cause of abnormal reaction of the patient, or of later complication, without mention of misadventure at the time of the procedure: Secondary | ICD-10-CM | POA: Diagnosis present

## 2017-06-19 DIAGNOSIS — K224 Dyskinesia of esophagus: Secondary | ICD-10-CM | POA: Diagnosis present

## 2017-06-19 DIAGNOSIS — E1143 Type 2 diabetes mellitus with diabetic autonomic (poly)neuropathy: Secondary | ICD-10-CM | POA: Diagnosis present

## 2017-06-19 DIAGNOSIS — Z79891 Long term (current) use of opiate analgesic: Secondary | ICD-10-CM | POA: Diagnosis not present

## 2017-06-19 DIAGNOSIS — K9189 Other postprocedural complications and disorders of digestive system: Secondary | ICD-10-CM | POA: Diagnosis present

## 2017-06-19 DIAGNOSIS — G43A0 Cyclical vomiting, not intractable: Secondary | ICD-10-CM | POA: Diagnosis not present

## 2017-06-19 DIAGNOSIS — E8729 Other acidosis: Secondary | ICD-10-CM

## 2017-06-19 DIAGNOSIS — G4730 Sleep apnea, unspecified: Secondary | ICD-10-CM | POA: Diagnosis present

## 2017-06-19 DIAGNOSIS — Z903 Acquired absence of stomach [part of]: Secondary | ICD-10-CM | POA: Diagnosis not present

## 2017-06-19 DIAGNOSIS — Z7982 Long term (current) use of aspirin: Secondary | ICD-10-CM

## 2017-06-19 DIAGNOSIS — R443 Hallucinations, unspecified: Secondary | ICD-10-CM | POA: Diagnosis not present

## 2017-06-19 DIAGNOSIS — K311 Adult hypertrophic pyloric stenosis: Secondary | ICD-10-CM | POA: Diagnosis present

## 2017-06-19 DIAGNOSIS — F1020 Alcohol dependence, uncomplicated: Secondary | ICD-10-CM | POA: Diagnosis present

## 2017-06-19 DIAGNOSIS — R1084 Generalized abdominal pain: Secondary | ICD-10-CM | POA: Diagnosis not present

## 2017-06-19 DIAGNOSIS — F17210 Nicotine dependence, cigarettes, uncomplicated: Secondary | ICD-10-CM | POA: Diagnosis present

## 2017-06-19 DIAGNOSIS — K219 Gastro-esophageal reflux disease without esophagitis: Secondary | ICD-10-CM | POA: Diagnosis not present

## 2017-06-19 DIAGNOSIS — E860 Dehydration: Secondary | ICD-10-CM | POA: Diagnosis present

## 2017-06-19 DIAGNOSIS — G8929 Other chronic pain: Secondary | ICD-10-CM | POA: Diagnosis not present

## 2017-06-19 DIAGNOSIS — E872 Acidosis: Secondary | ICD-10-CM | POA: Diagnosis not present

## 2017-06-19 DIAGNOSIS — K3184 Gastroparesis: Secondary | ICD-10-CM | POA: Diagnosis present

## 2017-06-19 DIAGNOSIS — Z79899 Other long term (current) drug therapy: Secondary | ICD-10-CM | POA: Diagnosis not present

## 2017-06-19 DIAGNOSIS — R410 Disorientation, unspecified: Secondary | ICD-10-CM

## 2017-06-19 DIAGNOSIS — D696 Thrombocytopenia, unspecified: Secondary | ICD-10-CM | POA: Diagnosis present

## 2017-06-19 LAB — HEPATIC FUNCTION PANEL
ALT: 31 U/L (ref 14–54)
AST: 43 U/L — ABNORMAL HIGH (ref 15–41)
Albumin: 3.7 g/dL (ref 3.5–5.0)
Alkaline Phosphatase: 138 U/L — ABNORMAL HIGH (ref 38–126)
Bilirubin, Direct: 0.3 mg/dL (ref 0.1–0.5)
Indirect Bilirubin: 0.8 mg/dL (ref 0.3–0.9)
Total Bilirubin: 1.1 mg/dL (ref 0.3–1.2)
Total Protein: 6.8 g/dL (ref 6.5–8.1)

## 2017-06-19 LAB — URINALYSIS, ROUTINE W REFLEX MICROSCOPIC
Bilirubin Urine: NEGATIVE
Glucose, UA: NEGATIVE mg/dL
Ketones, ur: NEGATIVE mg/dL
Leukocytes, UA: NEGATIVE
Nitrite: NEGATIVE
Protein, ur: 30 mg/dL — AB
Specific Gravity, Urine: 1.024 (ref 1.005–1.030)
pH: 5 (ref 5.0–8.0)

## 2017-06-19 LAB — CBC
HCT: 40.6 % (ref 36.0–46.0)
Hemoglobin: 13.3 g/dL (ref 12.0–15.0)
MCH: 24.9 pg — ABNORMAL LOW (ref 26.0–34.0)
MCHC: 32.8 g/dL (ref 30.0–36.0)
MCV: 76 fL — ABNORMAL LOW (ref 78.0–100.0)
Platelets: 197 10*3/uL (ref 150–400)
RBC: 5.34 MIL/uL — ABNORMAL HIGH (ref 3.87–5.11)
RDW: 15.1 % (ref 11.5–15.5)
WBC: 10.2 10*3/uL (ref 4.0–10.5)

## 2017-06-19 LAB — BASIC METABOLIC PANEL
Anion gap: 19 — ABNORMAL HIGH (ref 5–15)
BUN: 13 mg/dL (ref 6–20)
CO2: 14 mmol/L — ABNORMAL LOW (ref 22–32)
Calcium: 8.3 mg/dL — ABNORMAL LOW (ref 8.9–10.3)
Chloride: 106 mmol/L (ref 101–111)
Creatinine, Ser: 0.96 mg/dL (ref 0.44–1.00)
GFR calc Af Amer: >60 mL/min (ref >60)
GFR calc non Af Amer: 56 mL/min — ABNORMAL LOW (ref >60)
Glucose, Bld: 108 mg/dL — ABNORMAL HIGH (ref 65–99)
Potassium: 4.6 mmol/L (ref 3.5–5.1)
Sodium: 139 mmol/L (ref 135–145)

## 2017-06-19 LAB — MAGNESIUM: Magnesium: 1.5 mg/dL — ABNORMAL LOW (ref 1.7–2.4)

## 2017-06-19 LAB — LIPASE, BLOOD: Lipase: 22 U/L (ref 11–51)

## 2017-06-19 LAB — ETHANOL: Alcohol, Ethyl (B): <10 mg/dL (ref <10)

## 2017-06-19 MED ORDER — LORAZEPAM 2 MG/ML IJ SOLN
1.0000 mg | Freq: Four times a day (QID) | INTRAMUSCULAR | Status: AC | PRN
  Administered 2017-06-19 – 2017-06-22 (×4): 1 mg via INTRAVENOUS
  Filled 2017-06-19 (×4): qty 1

## 2017-06-19 MED ORDER — MAGNESIUM SULFATE 2 GM/50ML IV SOLN: 2.0000 g | Freq: Once | INTRAVENOUS | Status: DC

## 2017-06-19 MED ORDER — FOLIC ACID 1 MG PO TABS
1.0000 mg | ORAL_TABLET | Freq: Every day | ORAL | Status: DC
  Administered 2017-06-19 – 2017-06-23 (×5): 1 mg via ORAL
  Filled 2017-06-19 (×5): qty 1

## 2017-06-19 MED ORDER — THIAMINE HCL 100 MG/ML IJ SOLN
Freq: Once | INTRAVENOUS | Status: AC
  Administered 2017-06-19: 16:00:00 via INTRAVENOUS
  Filled 2017-06-19: qty 1000

## 2017-06-19 MED ORDER — HYDRALAZINE HCL 20 MG/ML IJ SOLN: 5.0000 mg | Freq: Four times a day (QID) | INTRAMUSCULAR | Status: DC | PRN

## 2017-06-19 MED ORDER — PANTOPRAZOLE SODIUM 40 MG IV SOLR
40.0000 mg | Freq: Two times a day (BID) | INTRAVENOUS | Status: DC
  Administered 2017-06-19 – 2017-06-21 (×5): 40 mg via INTRAVENOUS
  Filled 2017-06-19 (×5): qty 40

## 2017-06-19 MED ORDER — ACETAMINOPHEN 325 MG PO TABS
650.0000 mg | ORAL_TABLET | Freq: Four times a day (QID) | ORAL | Status: DC | PRN
  Administered 2017-06-19 – 2017-06-23 (×8): 650 mg via ORAL
  Filled 2017-06-19 (×8): qty 2

## 2017-06-19 MED ORDER — THIAMINE HCL 100 MG/ML IJ SOLN
100.0000 mg | Freq: Every day | INTRAMUSCULAR | Status: DC
  Filled 2017-06-19: qty 2

## 2017-06-19 MED ORDER — SODIUM CHLORIDE 0.9% FLUSH
3.0000 mL | Freq: Two times a day (BID) | INTRAVENOUS | Status: DC
  Administered 2017-06-19 – 2017-06-20 (×2): 3 mL via INTRAVENOUS

## 2017-06-19 MED ORDER — METOPROLOL TARTRATE 5 MG/5ML IV SOLN: 2.5000 mg | Freq: Three times a day (TID) | INTRAVENOUS | Status: DC | PRN

## 2017-06-19 MED ORDER — ENOXAPARIN SODIUM 40 MG/0.4ML ~~LOC~~ SOLN
40.0000 mg | SUBCUTANEOUS | Status: DC
  Administered 2017-06-19 – 2017-06-20 (×2): 40 mg via SUBCUTANEOUS
  Filled 2017-06-19 (×2): qty 0.4

## 2017-06-19 MED ORDER — ACETAMINOPHEN 325 MG PO TABS: 650.0000 mg | ORAL_TABLET | Freq: Four times a day (QID) | ORAL | Status: DC | PRN

## 2017-06-19 MED ORDER — LORAZEPAM 1 MG PO TABS: 1.0000 mg | ORAL_TABLET | Freq: Four times a day (QID) | ORAL | Status: AC | PRN

## 2017-06-19 MED ORDER — LORAZEPAM 2 MG/ML IJ SOLN
1.0000 mg | Freq: Once | INTRAMUSCULAR | Status: AC
  Administered 2017-06-19: 1 mg via INTRAVENOUS
  Filled 2017-06-19: qty 1

## 2017-06-19 MED ORDER — ADULT MULTIVITAMIN W/MINERALS CH
1.0000 | ORAL_TABLET | Freq: Every day | ORAL | Status: DC
  Administered 2017-06-19 – 2017-06-23 (×5): 1 via ORAL
  Filled 2017-06-19 (×5): qty 1

## 2017-06-19 MED ORDER — DEXTROSE-NACL 5-0.45 % IV SOLN
INTRAVENOUS | Status: DC
  Administered 2017-06-19 – 2017-06-22 (×8): via INTRAVENOUS

## 2017-06-19 MED ORDER — ACETAMINOPHEN 650 MG RE SUPP: 650.0000 mg | Freq: Four times a day (QID) | RECTAL | Status: DC | PRN

## 2017-06-19 MED ORDER — MORPHINE SULFATE (PF) 4 MG/ML IV SOLN
1.0000 mg | INTRAVENOUS | Status: DC | PRN
  Administered 2017-06-20: 1 mg via INTRAVENOUS
  Filled 2017-06-19: qty 1

## 2017-06-19 MED ORDER — MAGNESIUM SULFATE 2 GM/50ML IV SOLN
2.0000 g | Freq: Once | INTRAVENOUS | Status: AC
  Administered 2017-06-19: 2 g via INTRAVENOUS
  Filled 2017-06-19: qty 50

## 2017-06-19 MED ORDER — METHOCARBAMOL 1000 MG/10ML IJ SOLN
500.0000 mg | Freq: Three times a day (TID) | INTRAVENOUS | Status: DC
  Administered 2017-06-19 – 2017-06-21 (×5): 500 mg via INTRAVENOUS
  Filled 2017-06-19 (×2): qty 5
  Filled 2017-06-19: qty 550
  Filled 2017-06-19 (×3): qty 5
  Filled 2017-06-19: qty 550

## 2017-06-19 MED ORDER — GI COCKTAIL ~~LOC~~
30.0000 mL | Freq: Three times a day (TID) | ORAL | Status: DC | PRN
  Administered 2017-06-22: 30 mL via ORAL
  Filled 2017-06-19 (×3): qty 30

## 2017-06-19 MED ORDER — ONDANSETRON HCL 4 MG/2ML IJ SOLN
4.0000 mg | Freq: Four times a day (QID) | INTRAMUSCULAR | Status: DC | PRN
  Administered 2017-06-20 – 2017-06-22 (×4): 4 mg via INTRAVENOUS
  Filled 2017-06-19 (×4): qty 2

## 2017-06-19 MED ORDER — SODIUM CHLORIDE 0.9 % IV BOLUS (SEPSIS)
1000.0000 mL | Freq: Once | INTRAVENOUS | Status: AC
  Administered 2017-06-19: 1000 mL via INTRAVENOUS

## 2017-06-19 MED ORDER — VITAMIN B-1 100 MG PO TABS
100.0000 mg | ORAL_TABLET | Freq: Every day | ORAL | Status: DC
  Administered 2017-06-19 – 2017-06-23 (×5): 100 mg via ORAL
  Filled 2017-06-19 (×5): qty 1

## 2017-06-19 NOTE — ED Notes (Signed)
Attempted to call report, unable to take report at this time. Report given to ED night nurse to give to floor.

## 2017-06-19 NOTE — ED Notes (Addendum)
Patient transported to x-ray. ?

## 2017-06-19 NOTE — ED Notes (Signed)
Attempted PIV, no success. Dr. Ellender Hose to attempt [place] PIV.

## 2017-06-19 NOTE — ED Notes (Signed)
Patient transported to CT 

## 2017-06-19 NOTE — ED Provider Notes (Signed)
Received care of  Pt from Dr. Ellender Hose at [facility]. Briefly, this is a 77yo female who presents with generalized weakness, hallucinations. Has been drinking alcohol heavily, suspect delirium secondary to this, however no recent drink and early withdrawal on differential. Pt without other signs of withdrawal at this time. Plan to obtain CT and admit for further care.  CT completed without acute abnormality. Admitted for further care.   Gareth Morgan, MD 06/20/17 [phone]

## 2017-06-19 NOTE — ED Notes (Signed)
Attempted calling report several times to floor; no one answering the phone. Charge RN, Production assistant, radio, made aware.

## 2017-06-19 NOTE — ED Notes (Signed)
ED Provider at bedside. 
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
## 2017-06-19 NOTE — ED Triage Notes (Signed)
Patient complains of 3-4 days of general weakness and nausea with decreased appetite after drinking alcohol the past 4 days, states that she was celebrating her birthday. Also complains of ongoing back pain. Alert and oriented, NAD
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
## 2017-06-20 DIAGNOSIS — K2920 Alcoholic gastritis without bleeding: Principal | ICD-10-CM

## 2017-06-20 LAB — BASIC METABOLIC PANEL
Anion gap: 11 (ref 5–15)
BUN: <5 mg/dL — ABNORMAL LOW (ref 6–20)
CO2: 23 mmol/L (ref 22–32)
Calcium: 7.3 mg/dL — ABNORMAL LOW (ref 8.9–10.3)
Chloride: 105 mmol/L (ref 101–111)
Creatinine, Ser: 0.71 mg/dL (ref 0.44–1.00)
GFR calc Af Amer: >60 mL/min (ref >60)
GFR calc non Af Amer: >60 mL/min (ref >60)
Glucose, Bld: 116 mg/dL — ABNORMAL HIGH (ref 65–99)
Potassium: 2.6 mmol/L — CL (ref 3.5–5.1)
Sodium: 139 mmol/L (ref 135–145)

## 2017-06-20 LAB — MAGNESIUM: Magnesium: 1.6 mg/dL — ABNORMAL LOW (ref 1.7–2.4)

## 2017-06-20 MED ORDER — POTASSIUM CHLORIDE 10 MEQ/100ML IV SOLN
10.0000 meq | INTRAVENOUS | Status: AC
  Administered 2017-06-20 (×5): 10 meq via INTRAVENOUS
  Filled 2017-06-20 (×5): qty 100

## 2017-06-20 MED ORDER — POTASSIUM CHLORIDE CRYS ER 20 MEQ PO TBCR
40.0000 meq | EXTENDED_RELEASE_TABLET | Freq: Two times a day (BID) | ORAL | Status: DC
  Administered 2017-06-20: 40 meq via ORAL
  Filled 2017-06-20: qty 2

## 2017-06-20 MED ORDER — MAGNESIUM SULFATE 4 GM/100ML IV SOLN
4.0000 g | Freq: Once | INTRAVENOUS | Status: AC
Start: 2017-06-20 — End: 2017-06-20
  Administered 2017-06-20: 4 g via INTRAVENOUS
  Filled 2017-06-20: qty 100

## 2017-06-20 NOTE — Progress Notes (Signed)
PROGRESS NOTE    Emily [name]  QPY:[id number] DOB: Jan 29, 1941 DOA: 06/19/2017 PCP: Nolene Ebbs, MD  Brief Narrative: 77 y.o. 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  Patient presented to the ER reported 3-4 days of generalized weakness, nausea with vomiting and diffuse abdominal pain after binging on alcohol on [birth year] which was her birthday.  Patient states she drank 1/5 of liquor within a 24-hour period.  The following day she attempted to sit on a pint of some type of liquor but became too nauseated.  Since that time she has been unable to keep down liquids or solids and has had burning epigastric discomfort as well as diffuse lower abdominal pain.  She has had emesis of nonbloody yellow fluids and has had mucoid/yellow watery diarrhea about 5-6 times.  In the ER she was hypertensive, afebrile, and borderline tachycardic.  Her labs were unremarkable except for hypomagnesemia and an anion gap elevation of 19 with a low CO2 of 14 consistent with alcoholic ketoacidosis.  Urinalysis was not consistent with UTI.  Her alcohol level was <10.  In discussing with the patient she reports she had not had any alcoholic beverages in over 8 years until she decided to "have a party by myself" on her birthday.  ED Course:  Vital Signs: BP (!) 162/93   Pulse 96   Temp 98.2 F (36.8 C) (Oral)   Resp 16   SpO2 97%  CXR: Neg CT head without contrast: No acute findings Lab data: Sodium 139, potassium 4.6, chloride 106, CO2 14, anion gap 19, glucose 108, BUN 13, creatinine 0.96, magnesium 1.5, alkaline phosphatase 138, AST 43, white count 10,200 torrential not obtained, hemoglobin 13.3, platelets 197,000, urinalysis abnormal with amber color, large hemoglobin, 30 of protein,  specific gravity borderline elevated at 1.024, rare bacteria, RBCs TNTC. Medications and treatments: NS bolus times 1 L followed by IV fluid D5 half-normal saline, Ativan 1 mg    Assessment & Plan:   Principal Problem:   Alcoholic gastritis Active Problems:   Alcohol consumption binge drinking   GERD (gastroesophageal reflux disease)   Hypertension   History of Gastric outlet obstruction s/p resection   Chronic pain Alcoholic gastritis 2/2 Alcohol consumption binge drinking -Presents with recurrent nausea, vomiting and diarrhea, abdominal pain and inability to tolerate oral intake after binge drinking on 1/5 of liquor -Strict alcoholism but had not drunk in 8 years -Labs consistent with alcoholic ketosis and clinical exam consistent with alcoholic gastritis.  Advance diet to full liquids today. -Protonix 40 mg IV every 12 hours -Limit oral medications -Follow labs -D5 half-normal saline at 125/hr to assist with ketoacidosis as well as replace fluids lost -Reports does not drink regularly so we will utilize CIWA prn -Symptom management with Zofran IV  Hypokalemia/hypomagnesemia replete and recheck.    Hypertension -Borderline uncontrolled -Tolerate home medications    History of Gastric outlet obstruction s/p resection - prior Roux-en-Y procedure -Unable to utilize NSAIDs for pain management -Chronic GI complaints followed by [facility] gastroenterology in the outpatient setting -Holding Linzess for now as well as been till  GERD (gastroesophageal reflux disease) -PPI as above    Chronic pain -Unconfirmed medication reconciliation demonstrates patient on Vicodin chronically as well as Neurontin and Zanaflex -Utilize Robaxin IV -Low-dose IV morphine prn       DVT prophylaxis:  Lovenox Code Status: Full code Family Communication: No family available Disposition Plan TBD consult physical therapy Consultants: None  Procedures: None Antimicrobials  none  Subjective: Complains of nausea Objective: Vitals:   06/19/17 2022 06/20/17 0100 06/20/17 0544 06/20/17 0940  BP: (!) 171/80 (!) 165/84 (!) 154/79 (!) 143/72  Pulse: 99 88 77 88  Resp: 19 17 17    Temp: 98.5 F (36.9 C) 97.8 F (36.6 C) 98.3 F (36.8 C) 98.4 F (36.9 C)  TempSrc: Oral   Oral  SpO2: 100% 99% 100% 100%    Intake/Output Summary (Last 24 hours) at 06/20/2017 1116 Last data filed at 06/20/2017 1049 Gross per 24 hour  Intake 2463 ml  Output 900 ml  Net 1563 ml   There were no vitals filed for this visit.  Examination:  General exam: Appears calm and comfortable  Respiratory system: Clear to auscultation. Respiratory effort normal. Cardiovascular system: S1 & S2 heard, RRR. No JVD, murmurs, rubs, gallops or clicks. No pedal edema. Gastrointestinal system: Abdomen is nondistended, soft and nontender. No organomegaly or masses felt. Normal bowel sounds heard. Central nervous system: Alert and oriented. No focal neurological deficits. Extremities: Symmetric 5 x 5 power. Skin: No rashes, lesions or ulcers Psychiatry: Judgement and insight appear normal. Mood & affect appropriate.     Data Reviewed: I have personally reviewed following labs and imaging studies  CBC: Recent Labs  Lab 06/19/17 1019  WBC 10.2  HGB 13.3  HCT 40.6  MCV 76.0*  PLT 878   Basic Metabolic Panel: Recent Labs  Lab 06/19/17 1019 06/19/17 1336 06/20/17 0417  NA 139  --  139  K 4.6  --  2.6*  CL 106  --  105  CO2 14*  --  23  GLUCOSE 108*  --  116*  BUN 13  --  <5*  CREATININE 0.96  --  0.71  CALCIUM 8.3*  --  7.3*  MG  --  1.5* 1.6*   GFR: CrCl cannot be calculated (Unknown ideal weight.). Liver Function Tests: Recent Labs  Lab 06/19/17 1336  AST 43*  ALT 31  ALKPHOS 138*  BILITOT 1.1  PROT 6.8  ALBUMIN 3.7   Recent Labs  Lab 06/19/17 1336  LIPASE 22   No results for input(s): AMMONIA in the last 168 hours. Coagulation Profile: No results for  input(s): INR, PROTIME in the last 168 hours. Cardiac Enzymes: No results for input(s): CKTOTAL, CKMB, CKMBINDEX, TROPONINI in the last 168 hours. BNP (last 3 results) No results for input(s): PROBNP in the last 8760 hours. HbA1C: No results for input(s): HGBA1C in the last 72 hours. CBG: No results for input(s): GLUCAP in the last 168 hours. Lipid Profile: No results for input(s): CHOL, HDL, LDLCALC, TRIG, CHOLHDL, LDLDIRECT in the last 72 hours. Thyroid Function Tests: No results for input(s): TSH, T4TOTAL, FREET4, T3FREE, THYROIDAB in the last 72 hours. Anemia Panel: No results for input(s): VITAMINB12, FOLATE, FERRITIN, TIBC, IRON, RETICCTPCT in the last 72 hours. Sepsis Labs: No results for input(s): PROCALCITON, LATICACIDVEN in the last 168 hours.  No results found for this or any previous visit (from the past 240 hour(s)).       Radiology Studies: Dg Chest 2 View  Result Date: 06/19/2017 CLINICAL DATA:  Confusion and weakness. EXAM: CHEST  2 VIEW COMPARISON:  Chest x-ray dated April 08, 2017. FINDINGS: The heart size and mediastinal contours are within normal limits. Normal pulmonary vascularity. No focal consolidation, pleural effusion, or pneumothorax. No acute osseous abnormality.  Unchanged thoracic kyphosis. IMPRESSION: No active cardiopulmonary disease. Electronically Signed   By: Titus Dubin M.D.   On: 06/19/2017 15:50   Ct Head Wo Contrast  Result Date: 06/19/2017 CLINICAL DATA:  77 y/o  F; altered mental status. EXAM: CT HEAD WITHOUT CONTRAST TECHNIQUE: Contiguous axial images were obtained from the base of the skull through the vertex without intravenous contrast. COMPARISON:  06/07/2013 CT head.  07/12/2013 MRI head. FINDINGS: Brain: No evidence of acute infarction, hemorrhage, hydrocephalus, extra-axial collection or mass lesion/mass effect. Stable chronic microvascular ischemic changes and parenchymal volume loss of the brain. Vascular: Calcific atherosclerosis  of carotid siphons. No hyperdense vessel. Skull: Normal. Negative for fracture or focal lesion. Sinuses/Orbits: Small maxillary sinus mucous retention cyst. Left intra-ocular lens replacement. Otherwise negative. Other: None. IMPRESSION: 1. No acute intracranial abnormality identified. 2. Stable chronic microvascular ischemic changes and parenchymal volume loss of the brain. Electronically Signed   By: Kristine Garbe M.D.   On: 06/19/2017 17:02        Scheduled Meds: . enoxaparin (LOVENOX) injection  40 mg Subcutaneous Q24H  . folic acid  1 mg Oral Daily  . multivitamin with minerals  1 tablet Oral Daily  . pantoprazole (PROTONIX) IV  40 mg Intravenous Q12H  . potassium chloride  40 mEq Oral BID  . sodium chloride flush  3 mL Intravenous Q12H  . thiamine  100 mg Oral Daily   Or  . thiamine  100 mg Intravenous Daily   Continuous Infusions: . dextrose 5 % and 0.45% NaCl 125 mL/hr at 06/20/17 0546  . methocarbamol (ROBAXIN)  IV 500 mg (06/20/17 0545)     LOS: 1 day     Georgette Shell, MD [facility]  If 7PM-7AM, please contact night-coverage [email or web address] Password [facility] 06/20/2017, 11:16 AM

## 2017-06-20 NOTE — Progress Notes (Signed)
On call NP paged about critical value of potassium of 2.6.  37meq oral potassium ordered and given.

## 2017-06-21 ENCOUNTER — Other Ambulatory Visit: Payer: Medicare Other

## 2017-06-21 ENCOUNTER — Ambulatory Visit (HOSPITAL_COMMUNITY): Payer: Medicare Other

## 2017-06-21 LAB — BASIC METABOLIC PANEL
Anion gap: 7 (ref 5–15)
BUN: <5 mg/dL — ABNORMAL LOW (ref 6–20)
CO2: 25 mmol/L (ref 22–32)
Calcium: 7.5 mg/dL — ABNORMAL LOW (ref 8.9–10.3)
Chloride: 107 mmol/L (ref 101–111)
Creatinine, Ser: 0.62 mg/dL (ref 0.44–1.00)
GFR calc Af Amer: >60 mL/min (ref >60)
GFR calc non Af Amer: >60 mL/min (ref >60)
Glucose, Bld: 108 mg/dL — ABNORMAL HIGH (ref 65–99)
Potassium: 3.2 mmol/L — ABNORMAL LOW (ref 3.5–5.1)
Sodium: 139 mmol/L (ref 135–145)

## 2017-06-21 LAB — CBC WITH DIFFERENTIAL/PLATELET
Basophils Absolute: 0 10*3/uL (ref 0.0–0.1)
Basophils Relative: 0 %
Eosinophils Absolute: 0.1 10*3/uL (ref 0.0–0.7)
Eosinophils Relative: 1 %
HCT: 32.6 % — ABNORMAL LOW (ref 36.0–46.0)
Hemoglobin: 10.5 g/dL — ABNORMAL LOW (ref 12.0–15.0)
Lymphocytes Relative: 34 %
Lymphs Abs: 2.4 10*3/uL (ref 0.7–4.0)
MCH: 24.1 pg — ABNORMAL LOW (ref 26.0–34.0)
MCHC: 32.2 g/dL (ref 30.0–36.0)
MCV: 74.8 fL — ABNORMAL LOW (ref 78.0–100.0)
Monocytes Absolute: 0.5 10*3/uL (ref 0.1–1.0)
Monocytes Relative: 7 %
Neutro Abs: 4.1 10*3/uL (ref 1.7–7.7)
Neutrophils Relative %: 58 %
Platelets: 133 10*3/uL — ABNORMAL LOW (ref 150–400)
RBC: 4.36 MIL/uL (ref 3.87–5.11)
RDW: 14.4 % (ref 11.5–15.5)
WBC: 7.1 10*3/uL (ref 4.0–10.5)

## 2017-06-21 LAB — MAGNESIUM: Magnesium: 2.1 mg/dL (ref 1.7–2.4)

## 2017-06-21 MED ORDER — MAGNESIUM SULFATE 2 GM/50ML IV SOLN
2.0000 g | Freq: Once | INTRAVENOUS | Status: AC
  Administered 2017-06-21: 2 g via INTRAVENOUS
  Filled 2017-06-21 (×2): qty 50

## 2017-06-21 MED ORDER — POTASSIUM CHLORIDE 10 MEQ/100ML IV SOLN
10.0000 meq | INTRAVENOUS | Status: AC
  Administered 2017-06-21 (×3): 10 meq via INTRAVENOUS
  Filled 2017-06-21 (×3): qty 100

## 2017-06-21 MED ORDER — ENOXAPARIN SODIUM 40 MG/0.4ML ~~LOC~~ SOLN
40.0000 mg | SUBCUTANEOUS | Status: DC
  Administered 2017-06-21: 40 mg via SUBCUTANEOUS
  Filled 2017-06-21: qty 0.4

## 2017-06-21 MED ORDER — PROMETHAZINE HCL 25 MG/ML IJ SOLN
12.5000 mg | Freq: Four times a day (QID) | INTRAMUSCULAR | Status: DC | PRN
  Administered 2017-06-21 – 2017-06-22 (×2): 12.5 mg via INTRAVENOUS
  Filled 2017-06-21 (×2): qty 1
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
## 2017-06-21 NOTE — Progress Notes (Signed)
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 Patient presented to the ER reported 3-4 days of generalized weakness, nausea with vomiting and diffuse abdominal pain after binging on alcohol on [birth year] which was her birthday. Patient states she drank 1/5 of liquor within a 24-hour period.The following day she attempted to sit on a pint of some type of liquor but became too nauseated. Since that time she has been unable to keep down liquids or solids and has had burning epigastric discomfort as well as diffuse lower abdominal pain. She has had emesis of nonbloody yellow fluids and has had mucoid/yellow watery diarrhea about 5-6 times. In the ER she was hypertensive, afebrile, and borderline tachycardic. Her labs were unremarkable except for hypomagnesemia and an anion gap elevation of 19 with a low CO2 of 14 consistent with alcoholic ketoacidosis. Urinalysis was not consistent with UTI. Her alcohol level was <10. In discussing with the patient she reports she had not had any alcoholic beverages in over 8 years until she decided to "have a party by myself" on her birthday.    Assessment & Plan:   Principal Problem:   Alcoholic gastritis Active Problems:   Alcohol consumption binge drinking   GERD (gastroesophageal reflux disease)   Hypertension   History of Gastric outlet obstruction s/p resection   Chronic pain  1] alcoholic gastritis advance diet to regular diet today continue Protonix.  If she can tolerate regular diet DC IV fluids.  2] hypokalemia potassium 3.2 replete and recheck.  3] hypertension stable on current home  medications.  4] deconditioning obtain PT evaluation.  5] EtOH abuse on mild CIWA.  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  I am hoping that if she can tolerate her diet she can be discharged tomorrow. Consultants: NONE  Procedures: NONE Antimicrobials:NONE Subjective:RESTING   Objective: Vitals:   06/20/17 0940 06/20/17 1421 06/20/17 2318 06/21/17 0552  BP: (!) 143/72 (!) 145/72 139/62 118/60  Pulse: 88 78 89 88  Resp:   16 16  Temp: 98.4 F (36.9 C) 98.2 F (36.8 C) 98.3 F (36.8 C) 98.6 F (37 C)  TempSrc: Oral Oral Oral   SpO2: 100% 100% 98% 100%    Intake/Output Summary (Last 24 hours) at 06/21/2017 1037 Last data filed at 06/21/2017 4081 Gross per 24 hour  Intake 4068 ml  Output 1000 ml  Net 3068 ml   There were no vitals filed for this visit.  Examination:  General exam: Appears calm and comfortable  Respiratory system: Clear to auscultation. Respiratory effort normal. Cardiovascular system: S1 & S2 heard, RRR. No JVD, murmurs, rubs, gallops or clicks. No pedal edema. Gastrointestinal system: Abdomen is nondistended, soft and nontender. No organomegaly or masses felt. Normal bowel sounds heard. Central nervous system: Alert and oriented. No focal neurological deficits. Extremities: Symmetric 5 x 5 power. Skin: No rashes, lesions or ulcers Psychiatry: Judgement and insight appear normal. Mood & affect appropriate.     Data Reviewed: I have personally reviewed following labs and imaging studies  CBC: Recent Labs  Lab 06/19/17 1019 06/21/17 0309  WBC 10.2 7.1  NEUTROABS  --  4.1  HGB 13.3 10.5*  HCT 40.6 32.6*  MCV 76.0* 74.8*  PLT 197 272*   Basic Metabolic Panel: Recent Labs  Lab 06/19/17 1019 06/19/17 1336 06/20/17 0417 06/21/17 0309  NA 139  --  139 139  K 4.6  --  2.6* 3.2*  CL 106  --   105 107  CO2 14*  --  23 25  GLUCOSE 108*  --  116* 108*  BUN 13  --  <5* <5*  CREATININE 0.96  --  0.71 0.62  CALCIUM 8.3*  --  7.3* 7.5*  MG  --  1.5* 1.6* 2.1   GFR: CrCl cannot be calculated (Unknown ideal weight.). Liver Function Tests: Recent Labs  Lab 06/19/17 1336  AST 43*  ALT 31  ALKPHOS 138*  BILITOT 1.1  PROT 6.8  ALBUMIN 3.7   Recent Labs  Lab 06/19/17 1336  LIPASE 22   No results for input(s): AMMONIA in the last 168 hours. Coagulation Profile: No results for input(s): INR, PROTIME in the last 168 hours. Cardiac Enzymes: No results for input(s): CKTOTAL, CKMB, CKMBINDEX, TROPONINI in the last 168 hours. BNP (last 3 results) No results for input(s): PROBNP in the last 8760 hours. HbA1C: No results for input(s): HGBA1C in the last 72 hours. CBG: No results for input(s): GLUCAP in the last 168 hours. Lipid Profile: No results for input(s): CHOL, HDL, LDLCALC, TRIG, CHOLHDL, LDLDIRECT in the last 72 hours. Thyroid Function Tests: No results for input(s): TSH, T4TOTAL, FREET4, T3FREE, THYROIDAB in the last 72 hours. Anemia Panel: No results for input(s): VITAMINB12, FOLATE, FERRITIN, TIBC, IRON, RETICCTPCT in the last 72 hours. Sepsis Labs: No results for input(s): PROCALCITON, LATICACIDVEN in the last 168 hours.  No results found for this or any previous visit (from the past 240 hour(s)).       Radiology Studies: Dg Chest 2 View  Result Date: 06/19/2017 CLINICAL DATA:  Confusion and weakness. EXAM: CHEST  2 VIEW COMPARISON:  Chest x-ray dated April 08, 2017. FINDINGS: The heart size and mediastinal contours are within normal limits. Normal pulmonary vascularity. No focal consolidation, pleural effusion, or pneumothorax. No acute osseous abnormality. Unchanged thoracic kyphosis. IMPRESSION: No active cardiopulmonary disease. Electronically Signed   By: Titus Dubin M.D.   On: 06/19/2017 15:50   Ct Head Wo Contrast  Result Date:  06/19/2017 CLINICAL DATA:  77 y/o  F; altered mental status. EXAM: CT HEAD WITHOUT CONTRAST TECHNIQUE: Contiguous axial images were obtained from the base of the skull through the vertex without intravenous contrast. COMPARISON:  06/07/2013 CT head.  07/12/2013 MRI head. FINDINGS: Brain: No evidence of acute infarction, hemorrhage, hydrocephalus, extra-axial collection or mass lesion/mass effect. Stable chronic microvascular ischemic changes and parenchymal volume loss of the brain. Vascular: Calcific atherosclerosis of carotid siphons. No hyperdense vessel. Skull: Normal. Negative for fracture or focal lesion. Sinuses/Orbits: Small maxillary sinus mucous retention cyst. Left intra-ocular lens replacement. Otherwise negative. Other: None. IMPRESSION: 1. No acute intracranial abnormality identified. 2. Stable chronic microvascular ischemic changes and parenchymal volume loss of the brain. Electronically Signed   By: Kristine Garbe M.D.   On: 06/19/2017 17:02        Scheduled Meds: . folic acid  1 mg Oral Daily  . multivitamin with minerals  1 tablet Oral Daily  . pantoprazole (PROTONIX) IV  40 mg Intravenous Q12H  . sodium chloride flush  3 mL Intravenous Q12H  . thiamine  100 mg Oral Daily   Or  . thiamine  100 mg Intravenous Daily   Continuous Infusions: . dextrose 5 % and 0.45% NaCl 125 mL/hr at 06/21/17 0947  . methocarbamol (ROBAXIN)  IV Stopped (06/21/17 0542)     LOS: 2 days     Georgette Shell, MD [facility]  If 7PM-7AM, please contact night-coverage [email or web address] Password [facility] 06/21/2017, 10:37 AM

## 2017-06-22 ENCOUNTER — Other Ambulatory Visit: Payer: Self-pay

## 2017-06-22 DIAGNOSIS — R1084 Generalized abdominal pain: Secondary | ICD-10-CM

## 2017-06-22 DIAGNOSIS — G43A0 Cyclical vomiting, not intractable: Secondary | ICD-10-CM

## 2017-06-22 LAB — BASIC METABOLIC PANEL
Anion gap: 9 (ref 5–15)
BUN: <5 mg/dL — ABNORMAL LOW (ref 6–20)
CO2: 21 mmol/L — ABNORMAL LOW (ref 22–32)
Calcium: 7.6 mg/dL — ABNORMAL LOW (ref 8.9–10.3)
Chloride: 109 mmol/L (ref 101–111)
Creatinine, Ser: 0.72 mg/dL (ref 0.44–1.00)
GFR calc Af Amer: >60 mL/min (ref >60)
GFR calc non Af Amer: >60 mL/min (ref >60)
Glucose, Bld: 97 mg/dL (ref 65–99)
Potassium: 3.3 mmol/L — ABNORMAL LOW (ref 3.5–5.1)
Sodium: 139 mmol/L (ref 135–145)

## 2017-06-22 MED ORDER — ONDANSETRON HCL 4 MG/2ML IJ SOLN
4.0000 mg | Freq: Four times a day (QID) | INTRAMUSCULAR | Status: DC
  Administered 2017-06-22 – 2017-06-23 (×3): 4 mg via INTRAVENOUS
  Filled 2017-06-22 (×3): qty 2

## 2017-06-22 MED ORDER — PANTOPRAZOLE SODIUM 40 MG PO TBEC
40.0000 mg | DELAYED_RELEASE_TABLET | Freq: Two times a day (BID) | ORAL | Status: DC
  Administered 2017-06-22 – 2017-06-23 (×3): 40 mg via ORAL
  Filled 2017-06-22 (×3): qty 1

## 2017-06-22 MED ORDER — POTASSIUM CHLORIDE 10 MEQ/100ML IV SOLN
10.0000 meq | INTRAVENOUS | Status: AC
  Administered 2017-06-22 (×3): 10 meq via INTRAVENOUS
  Filled 2017-06-22 (×3): qty 100

## 2017-06-22 MED ORDER — SODIUM CHLORIDE 0.45 % IV SOLN
INTRAVENOUS | Status: DC
  Administered 2017-06-22 – 2017-06-23 (×2): via INTRAVENOUS

## 2017-06-22 MED ORDER — PANTOPRAZOLE SODIUM 40 MG PO TBEC: 40.0000 mg | DELAYED_RELEASE_TABLET | Freq: Every day | ORAL | 0 refills | Status: DC
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
## 2017-06-22 NOTE — Care Management Note (Signed)
Case Management Note  Patient Details  Name: Emily [name] MRN: [id number] Date of Birth: 01-Jun-1940  Subjective/Objective:               Spoke with patient at bedside. She states she lives at home alone. She uses [employer] for transportation. She has a RW. She has used [facility] in the past and would like to use them again at [place].      Action/Plan:  Needs: Referral to [facility] and [facility] orders as indicated.   Expected Discharge Date:  06/22/17               Expected Discharge Plan:  [place]  In-House Referral:     Discharge planning Services  CM Consult  Post Acute Care Choice:  Home Health Choice offered to:     DME Arranged:    DME Agency:     HH Arranged:    [place] Agency:     Status of Service:  In process, will continue to follow  If discussed at Long Length of Stay Meetings, dates discussed:    Additional Comments:  Carles Collet, RN 06/22/2017, 10:48 AM

## 2017-06-22 NOTE — Care Management Note (Signed)
Case Management Note  Patient Details  Name: Emily [name] MRN: [id number] Date of Birth: 1940-07-24  Subjective/Objective:                    Action/Plan:   Expected Discharge Date:  06/22/17               Expected Discharge Plan:  [place]  In-House Referral:     Discharge planning Services  CM Consult  Post Acute Care Choice:  Home Health Choice offered to:  Patient  DME Arranged:  N/A DME Agency:  NA  HH Arranged:  RN, PT [place] Agency:  [place]  Status of Service:  Completed, signed off  If discussed at [place] of Stay Meetings, dates discussed:    Additional Comments:  Marilu Favre, RN 06/22/2017, 1:01 PM
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
## 2017-06-22 NOTE — Progress Notes (Signed)
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 Patient presented to the ER reported 3-4 days of generalized weakness, nausea with vomiting and diffuse abdominal pain after binging on alcohol on [birth year] which was her birthday. Patient states she drank 1/5 of liquor within a 24-hour period.The following day she attempted to sit on a pint of some type of liquor but became too nauseated. Since that time she has been unable to keep down liquids or solids and has had burning epigastric discomfort as well as diffuse lower abdominal pain. She has had emesis of nonbloody yellow fluids and has had mucoid/yellow watery diarrhea about 5-6 times. In the ER she was hypertensive, afebrile, and borderline tachycardic. Her labs were unremarkable except for hypomagnesemia and an anion gap elevation of 19 with a low CO2 of 14 consistent with alcoholic ketoacidosis. Urinalysis was not consistent with UTI. Her alcohol level was <10. In discussing with the patient she reports she had not had any alcoholic beverages in over 8 years until she decided to "have a party by myself" on her birthday.    Assessment & Plan:   Principal Problem:   Alcoholic gastritis Active Problems:   Alcohol consumption binge drinking   GERD (gastroesophageal reflux disease)   Hypertension   History of Gastric outlet obstruction s/p resection   Chronic pain  1] alcoholic gastritis /esophageal dysmotility/failed Nissen fundoplication/gastroparesis/history of subtotal gastrectomy and gastrojejunostomy-patient continues to have nausea and vomiting not able to tolerate p.o.  intake.  Continue Protonix and consult GI [facility] gastroenterology.  I will keep her on clear liquids for the time being and IV fluids  2] hypokalemia replete and recheck  3] anemia with a drop in hemoglobin from  4] hypertension stable  5] EtOH abuse treated with mild CIWA.  6] thrombocytopenia multifactorial possible alcohol abuse, patient on Lovenox DC Lovenox and follow-up labs tomorrow.   DVT prophylaxis: Patient is on Lovenox but with a decrease in platelets I will DC Lovenox and put her on SCD. Code Status: Full code Family Communication: There is no family there today but I discussed with the family yesterday. Disposition Plan: TBD  Consultants: GI consult pending   Procedures none Antimicrobials: None  Subjective: States she wants to go home.  But she threw up her breakfast this morning.   Objective: Vitals:   06/21/17 1400 06/21/17 2037 06/22/17 0452 06/22/17 0800  BP: 137/67 137/60 (!) 144/74 140/85  Pulse: 82 92 80 77  Resp: 18 18 18 18   Temp: 98.6 F (37 C) 98.4 F (36.9 C) 98.2 F (36.8 C) (!) 96.8 F (36 C)  TempSrc: Oral Oral Oral Oral  SpO2: 99% 97% 100% 100%    Intake/Output Summary (Last 24 hours) at 06/22/2017 1027 Last data filed at 06/22/2017 0447 Gross per 24 hour  Intake 3502.92 ml  Output -  Net 3502.92 ml   There were no vitals filed for this visit.  Examination:  General exam: Appears calm and comfortable  Respiratory system: Clear to auscultation. Respiratory effort normal. Cardiovascular system: S1 & S2 heard, RRR. No JVD, murmurs, rubs, gallops or clicks. No pedal edema. Gastrointestinal system: Abdomen is nondistended, soft and tender. No organomegaly or masses felt. Normal bowel sounds heard. Central nervous  system: Alert and oriented. No focal neurological deficits. Extremities: Symmetric 5 x 5 power. Skin: No rashes, lesions or ulcers Psychiatry: Judgement and insight appear normal. Mood & affect appropriate.     Data  Reviewed: I have personally reviewed following labs and imaging studies  CBC: Recent Labs  Lab 06/19/17 1019 06/21/17 0309  WBC 10.2 7.1  NEUTROABS  --  4.1  HGB 13.3 10.5*  HCT 40.6 32.6*  MCV 76.0* 74.8*  PLT 197 703*   Basic Metabolic Panel: Recent Labs  Lab 06/19/17 1019 06/19/17 1336 06/20/17 0417 06/21/17 0309 06/22/17 0531  NA 139  --  139 139 139  K 4.6  --  2.6* 3.2* 3.3*  CL 106  --  105 107 109  CO2 14*  --  23 25 21*  GLUCOSE 108*  --  116* 108* 97  BUN 13  --  <5* <5* <5*  CREATININE 0.96  --  0.71 0.62 0.72  CALCIUM 8.3*  --  7.3* 7.5* 7.6*  MG  --  1.5* 1.6* 2.1  --    GFR: CrCl cannot be calculated (Unknown ideal weight.). Liver Function Tests: Recent Labs  Lab 06/19/17 1336  AST 43*  ALT 31  ALKPHOS 138*  BILITOT 1.1  PROT 6.8  ALBUMIN 3.7   Recent Labs  Lab 06/19/17 1336  LIPASE 22   No results for input(s): AMMONIA in the last 168 hours. Coagulation Profile: No results for input(s): INR, PROTIME in the last 168 hours. Cardiac Enzymes: No results for input(s): CKTOTAL, CKMB, CKMBINDEX, TROPONINI in the last 168 hours. BNP (last 3 results) No results for input(s): PROBNP in the last 8760 hours. HbA1C: No results for input(s): HGBA1C in the last 72 hours. CBG: No results for input(s): GLUCAP in the last 168 hours. Lipid Profile: No results for input(s): CHOL, HDL, LDLCALC, TRIG, CHOLHDL, LDLDIRECT in the last 72 hours. Thyroid Function Tests: No results for input(s): TSH, T4TOTAL, FREET4, T3FREE, THYROIDAB in the last 72 hours. Anemia Panel: No results for input(s): VITAMINB12, FOLATE, FERRITIN, TIBC, IRON, RETICCTPCT in the last 72 hours. Sepsis Labs: No results for input(s): PROCALCITON, LATICACIDVEN in the last 168 hours.  No results found for this or any previous visit (from the past 240 hour(s)).       Radiology Studies: No results found.      Scheduled Meds: . enoxaparin (LOVENOX) injection  40 mg Subcutaneous  Q24H  . folic acid  1 mg Oral Daily  . multivitamin with minerals  1 tablet Oral Daily  . pantoprazole  40 mg Oral BID  . sodium chloride flush  3 mL Intravenous Q12H  . thiamine  100 mg Oral Daily   Continuous Infusions: . dextrose 5 % and 0.45% NaCl 125 mL/hr at 06/22/17 0417     LOS: 3 days       Georgette Shell, MD [facility] If 7PM-7AM, please contact night-coverage [email or web address] Password [facility] 06/22/2017, 10:27 AM

## 2017-06-22 NOTE — Discharge Summary (Deleted)
Physician Discharge Summary  Emily [name] VFI:[id number] DOB: [birth year] DOA: 06/19/2017  PCP: Nolene Ebbs, MD  Admit date: 06/19/2017 Discharge date: 06/22/2017  Admitted From: home Disposition: home  Recommendations for Outpatient Follow-up:  1. Follow up with PCP in 1-2 weeks 2. 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 Patient presented to the ER reported 3-4 days of generalized weakness, nausea with vomiting and diffuse abdominal pain after binging on alcohol on [birth year] which was her birthday. Patient states she drank 1/5 of liquor within a 24-hour period.The following day she attempted to sit on a pint of some type of liquor but became too nauseated. Since that time she has been unable to keep down liquids or solids and has had burning epigastric discomfort as well as diffuse lower abdominal pain. She has had emesis of nonbloody yellow fluids and has had mucoid/yellow watery diarrhea about 5-6 times. In the ER she was hypertensive, afebrile, and borderline tachycardic. Her labs were unremarkable except for hypomagnesemia and an anion gap elevation of 19 with a low CO2 of 14 consistent with alcoholic ketoacidosis. Urinalysis was not consistent with UTI. Her alcohol level was <10. In discussing with the patient she reports she had not had any alcoholic beverages in over 8 years until she decided to "have a party by myself" on her birthday.     Discharge Diagnoses:  Principal Problem:   Alcoholic gastritis Active Problems:   Alcohol consumption binge drinking    GERD (gastroesophageal reflux disease)   Hypertension   History of Gastric outlet obstruction s/p resection   Chronic pain 1] alcoholic gastritis tolerating diet. was discharged on Protonix 40 mg once a day.  Patient was treated with IV fluids and kept n.p.o. at the time of admission over the course of 24 hours for nausea and vomiting improved and was able to tolerate regular food. 2] hypokalemia repleted  3] hypertension stable on current home medications.  4] deconditioning PT recommends home health PT.  5] EtOH abuse treated with  as needed Ativan folate thiamine.  Discussed in detail with patient and family about no more drinking alcohol.     Discharge Instructions   Allergies as of 06/22/2017   No Known Allergies     Medication List    STOP taking these medications   acetaminophen 500 MG tablet Commonly known as:  TYLENOL   Glycopyrrolate 1.5 MG Tabs Commonly known as:  GLYCATE     TAKE these medications   aspirin 81 MG tablet Take 81 mg by mouth daily.   cetirizine 10 MG tablet Commonly known as:  ZYRTEC Take 10 mg by mouth daily.   dicyclomine 20 MG tablet Commonly known as:  BENTYL Take 20 mg by mouth 2 (two) times daily.   esomeprazole 40 MG capsule Commonly known as:  NEXIUM take 1 capsule by mouth twice a day   gabapentin 100 MG capsule Commonly known as:  NEURONTIN Take 100 mg by mouth 4 (four) times daily.   linaclotide 145 MCG Caps capsule Commonly known as:  LINZESS Take 1 capsule (145 mcg total) by mouth daily before breakfast.   LINZESS 72 MCG capsule Generic drug:  linaclotide take 1 capsule by mouth once daily before BREAKFAST   losartan 25  MG tablet Commonly known as:  COZAAR Take 25 mg by mouth daily.   mirtazapine 15 MG tablet Commonly known as:  REMERON Take 15 mg by mouth every evening.   oxybutynin 5 MG tablet Commonly known as:  DITROPAN Take 5 mg by mouth every 8 (eight) hours as needed for bladder spasms.    pantoprazole 40 MG tablet Commonly known as:  PROTONIX Take 1 tablet (40 mg total) by mouth daily.   RESTASIS 0.05 % ophthalmic emulsion Generic drug:  cycloSPORINE Place 1 drop into both eyes 2 (two) times daily.   sucralfate 1 GM/10ML suspension Commonly known as:  CARAFATE Take 10 mLs (1 g total) by mouth 4 (four) times daily -  with meals and at bedtime.   tiZANidine 4 MG tablet Commonly known as:  ZANAFLEX Take 4 mg by mouth 2 (two) times daily as needed for muscle spasms.   Travoprost (BAK Free) 0.004 % Soln ophthalmic solution Commonly known as:  TRAVATAN Place 1 drop into both eyes at bedtime.      Follow-up Information    Nolene Ebbs, MD Follow up.   Specialty:  Internal Medicine Contact information: [address] [place] [address] [phone]          No Known Allergies  Consultations:  None   Procedures/Studies: Dg Chest 2 View  Result Date: 06/19/2017 CLINICAL DATA:  Confusion and weakness. EXAM: CHEST  2 VIEW COMPARISON:  Chest x-ray dated April 08, 2017. FINDINGS: The heart size and mediastinal contours are within normal limits. Normal pulmonary vascularity. No focal consolidation, pleural effusion, or pneumothorax. No acute osseous abnormality. Unchanged thoracic kyphosis. IMPRESSION: No active cardiopulmonary disease. Electronically Signed   By: Titus Dubin M.D.   On: 06/19/2017 15:50   Ct Head Wo Contrast  Result Date: 06/19/2017 CLINICAL DATA:  77 y/o  F; altered mental status. EXAM: CT HEAD WITHOUT CONTRAST TECHNIQUE: Contiguous axial images were obtained from the base of the skull through the vertex without intravenous contrast. COMPARISON:  06/07/2013 CT head.  07/12/2013 MRI head. FINDINGS: Brain: No evidence of acute infarction, hemorrhage, hydrocephalus, extra-axial collection or mass lesion/mass effect. Stable chronic microvascular ischemic changes and parenchymal volume loss of the brain. Vascular: Calcific atherosclerosis  of carotid siphons. No hyperdense vessel. Skull: Normal. Negative for fracture or focal lesion. Sinuses/Orbits: Small maxillary sinus mucous retention cyst. Left intra-ocular lens replacement. Otherwise negative. Other: None. IMPRESSION: 1. No acute intracranial abnormality identified. 2. Stable chronic microvascular ischemic changes and parenchymal volume loss of the brain. Electronically Signed   By: Kristine Garbe M.D.   On: 06/19/2017 17:02    (Echo, Carotid, EGD, Colonoscopy, ERCP)    Subjective:   Discharge Exam: Vitals:   06/22/17 0452 06/22/17 0800  BP: (!) 144/74 140/85  Pulse: 80 77  Resp: 18 18  Temp: 98.2 F (36.8 C) (!) 96.8 F (36 C)  SpO2: 100% 100%   Vitals:   06/21/17 1400 06/21/17 2037 06/22/17 0452 06/22/17 0800  BP: 137/67 137/60 (!) 144/74 140/85  Pulse: 82 92 80 77  Resp: 18 18 18 18   Temp: 98.6 F (37 C) 98.4 F (36.9 C) 98.2 F (36.8 C) (!) 96.8 F (36 C)  TempSrc: Oral Oral Oral Oral  SpO2: 99% 97% 100% 100%    General: Pt is alert, awake, not in acute distress Cardiovascular: RRR, S1/S2 +, no rubs, no gallops Respiratory: CTA bilaterally, no wheezing, no rhonchi Abdominal: Soft, NT, ND, bowel sounds + Extremities: no edema, no cyanosis  The results of significant diagnostics from this hospitalization (including imaging, microbiology, ancillary and laboratory) are listed below for reference.     Microbiology: No results found for this or any previous visit (from the past 240 hour(s)).   Labs: BNP (last 3 results) No results for input(s): BNP in the last 8760 hours. Basic Metabolic Panel: Recent Labs  Lab 06/19/17 1019 06/19/17 1336 06/20/17 0417 06/21/17 0309 06/22/17 0531  NA 139  --  139 139 139  K 4.6  --  2.6* 3.2* 3.3*  CL 106  --  105 107 109  CO2 14*  --  23 25 21*  GLUCOSE 108*  --  116* 108* 97  BUN 13  --  <5* <5* <5*  CREATININE 0.96  --  0.71 0.62 0.72  CALCIUM 8.3*  --  7.3* 7.5* 7.6*  MG  --  1.5*  1.6* 2.1  --    Liver Function Tests: Recent Labs  Lab 06/19/17 1336  AST 43*  ALT 31  ALKPHOS 138*  BILITOT 1.1  PROT 6.8  ALBUMIN 3.7   Recent Labs  Lab 06/19/17 1336  LIPASE 22   No results for input(s): AMMONIA in the last 168 hours. CBC: Recent Labs  Lab 06/19/17 1019 06/21/17 0309  WBC 10.2 7.1  NEUTROABS  --  4.1  HGB 13.3 10.5*  HCT 40.6 32.6*  MCV 76.0* 74.8*  PLT 197 133*   Cardiac Enzymes: No results for input(s): CKTOTAL, CKMB, CKMBINDEX, TROPONINI in the last 168 hours. BNP: Invalid input(s): POCBNP CBG: No results for input(s): GLUCAP in the last 168 hours. D-Dimer No results for input(s): DDIMER in the last 72 hours. Hgb A1c No results for input(s): HGBA1C in the last 72 hours. Lipid Profile No results for input(s): CHOL, HDL, LDLCALC, TRIG, CHOLHDL, LDLDIRECT in the last 72 hours. Thyroid function studies No results for input(s): TSH, T4TOTAL, T3FREE, THYROIDAB in the last 72 hours.  Invalid input(s): FREET3 Anemia work up No results for input(s): VITAMINB12, FOLATE, FERRITIN, TIBC, IRON, RETICCTPCT in the last 72 hours. Urinalysis    Component Value Date/Time   COLORURINE AMBER (A) 06/19/2017 1621   APPEARANCEUR CLEAR 06/19/2017 1621   LABSPEC 1.024 06/19/2017 1621   PHURINE 5.0 06/19/2017 1621   GLUCOSEU NEGATIVE 06/19/2017 1621   GLUCOSEU NEGATIVE 06/22/2014 1426   HGBUR LARGE (A) 06/19/2017 1621   BILIRUBINUR NEGATIVE 06/19/2017 1621   KETONESUR NEGATIVE 06/19/2017 1621   PROTEINUR 30 (A) 06/19/2017 1621   UROBILINOGEN 1.0 02/23/2015 1917   NITRITE NEGATIVE 06/19/2017 1621   LEUKOCYTESUR NEGATIVE 06/19/2017 1621   Sepsis Labs Invalid input(s): PROCALCITONIN,  WBC,  LACTICIDVEN Microbiology No results found for this or any previous visit (from the past 240 hour(s)).   Time coordinating discharge: Over 30 minutes  SIGNED:   Georgette Shell, MD  [facility] 06/22/2017, 9:51 AM Pager   If 7PM-7AM, please  contact night-coverage [email or web address] Password [email or web address]

## 2017-06-23 ENCOUNTER — Telehealth: Payer: Self-pay | Admitting: Gastroenterology

## 2017-06-23 ENCOUNTER — Telehealth: Payer: Self-pay | Admitting: Hematology

## 2017-06-23 ENCOUNTER — Encounter (HOSPITAL_COMMUNITY): Payer: Medicare Other

## 2017-06-23 ENCOUNTER — Encounter (HOSPITAL_COMMUNITY): Payer: Self-pay

## 2017-06-23 ENCOUNTER — Ambulatory Visit (HOSPITAL_COMMUNITY)
Admission: RE | Admit: 2017-06-23 | Discharge: 2017-06-23 | Disposition: A | Discharge status: Home / Self Care | Payer: Medicare Other | Source: Ambulatory Visit | Attending: Hematology | Admitting: Hematology

## 2017-06-23 LAB — CBC WITH DIFFERENTIAL/PLATELET
Basophils Absolute: 0 10*3/uL (ref 0.0–0.1)
Basophils Relative: 0 %
Eosinophils Absolute: 0.1 10*3/uL (ref 0.0–0.7)
Eosinophils Relative: 2 %
HCT: 31.5 % — ABNORMAL LOW (ref 36.0–46.0)
Hemoglobin: 10.2 g/dL — ABNORMAL LOW (ref 12.0–15.0)
Lymphocytes Relative: 40 %
Lymphs Abs: 2.7 10*3/uL (ref 0.7–4.0)
MCH: 24.5 pg — ABNORMAL LOW (ref 26.0–34.0)
MCHC: 32.4 g/dL (ref 30.0–36.0)
MCV: 75.7 fL — ABNORMAL LOW (ref 78.0–100.0)
Monocytes Absolute: 0.5 10*3/uL (ref 0.1–1.0)
Monocytes Relative: 7 %
Neutro Abs: 3.4 10*3/uL (ref 1.7–7.7)
Neutrophils Relative %: 51 %
Platelets: 121 10*3/uL — ABNORMAL LOW (ref 150–400)
RBC: 4.16 MIL/uL (ref 3.87–5.11)
RDW: 15.2 % (ref 11.5–15.5)
WBC: 6.7 10*3/uL (ref 4.0–10.5)

## 2017-06-23 LAB — COMPREHENSIVE METABOLIC PANEL
ALT: 17 U/L (ref 14–54)
AST: 27 U/L (ref 15–41)
Albumin: 2.8 g/dL — ABNORMAL LOW (ref 3.5–5.0)
Alkaline Phosphatase: 90 U/L (ref 38–126)
Anion gap: 7 (ref 5–15)
BUN: <5 mg/dL — ABNORMAL LOW (ref 6–20)
CO2: 25 mmol/L (ref 22–32)
Calcium: 7.9 mg/dL — ABNORMAL LOW (ref 8.9–10.3)
Chloride: 107 mmol/L (ref 101–111)
Creatinine, Ser: 0.6 mg/dL (ref 0.44–1.00)
GFR calc Af Amer: >60 mL/min (ref >60)
GFR calc non Af Amer: >60 mL/min (ref >60)
Glucose, Bld: 83 mg/dL (ref 65–99)
Potassium: 3.3 mmol/L — ABNORMAL LOW (ref 3.5–5.1)
Sodium: 139 mmol/L (ref 135–145)
Total Bilirubin: 0.7 mg/dL (ref 0.3–1.2)
Total Protein: 5.1 g/dL — ABNORMAL LOW (ref 6.5–8.1)

## 2017-06-23 MED ORDER — POTASSIUM CHLORIDE ER 10 MEQ PO TBCR: 40.0000 meq | EXTENDED_RELEASE_TABLET | Freq: Once | ORAL | 0 refills | Status: DC

## 2017-06-23 MED ORDER — POTASSIUM CHLORIDE 10 MEQ/100ML IV SOLN
10.0000 meq | INTRAVENOUS | Status: DC
  Administered 2017-06-23: 10 meq via INTRAVENOUS
  Filled 2017-06-23: qty 100

## 2017-06-23 MED ORDER — POTASSIUM CHLORIDE ER 10 MEQ PO TBCR: 40.0000 meq | EXTENDED_RELEASE_TABLET | Freq: Every day | ORAL | 0 refills | Status: DC

## 2017-06-23 MED ORDER — ONDANSETRON HCL 4 MG PO TABS: 4.0000 mg | ORAL_TABLET | Freq: Every day | ORAL | 1 refills | Status: DC | PRN

## 2017-06-23 NOTE — Telephone Encounter (Signed)
She doesn't need to be on both Nexium and Protonix, just pick the one she prefers and DC the other.  Ok to send Rx for Promethazine 12.5mg  daily as needed She can Tylenol upto 2gm daily in divided doses every 8 hours as needed

## 2017-06-23 NOTE — Telephone Encounter (Signed)
Spoke to Emily [name].  She was discharged on Nexium and Pantoprazole. Is this correct? She is nauseated despite using Zofran. Wants Promethazine. Why can she not take Tylenol? She wants something to take for her headaches which occur daily. Thank you

## 2017-06-23 NOTE — Telephone Encounter (Signed)
This pt was admitted  and is a Dr Silverio Decamp pt,she has a question regarding meds changed while admitted

## 2017-06-23 NOTE — Telephone Encounter (Signed)
Patients daughter called to cancel 2/21 appointment with Dr Burr Medico due to her mother being in the hospital.  In basket message sent to Dr Burr Medico as well

## 2017-06-23 NOTE — Discharge Summary (Signed)
Physician Discharge Summary  Emily [name] POE:[id number] DOB: 08-27-1940 DOA: 06/19/2017  PCP: Nolene Ebbs, MD  Admit date: 06/19/2017 Discharge date: 06/23/2017  Admitted From:home Dispositionhome Recommendations for Outpatient Follow-up:  1. Follow up with PCP in 1-2 weeks 2. 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 Patient presented to the ER reported 3-4 days of generalized weakness, nausea with vomiting and diffuse abdominal pain after binging on alcohol on [birth year] which was her birthday. Patient states she drank 1/5 of liquor within a 24-hour period.The following day she attempted to sit on a pint of some type of liquor but became too nauseated. Since that time she has been unable to keep down liquids or solids and has had burning epigastric discomfort as well as diffuse lower abdominal pain. She has had emesis of nonbloody yellow fluids and has had mucoid/yellow watery diarrhea about 5-6 times. In the ER she was hypertensive, afebrile, and borderline tachycardic. Her labs were unremarkable except for hypomagnesemia and an anion gap elevation of 19 with a low CO2 of 14 consistent with alcoholic ketoacidosis. Urinalysis was not consistent with UTI. Her alcohol level was <10. In discussing with the patient she reports she had not had any alcoholic beverages in over 8 years until she decided to "have a party by myself" on her birthday.    Discharge Diagnoses:  Principal Problem:   Alcoholic gastritis Active Problems:   Alcohol consumption binge drinking   GERD  (gastroesophageal reflux disease)   Hypertension   History of Gastric outlet obstruction s/p resection   Chronic pain  1] alcoholic gastritis /esophageal dysmotility/failed Nissen fundoplication/gastroparesis/history of subtotal gastrectomy and gastrojejunostomy-patient seen by GI.no new recommendations.fu with gi.   2] hypokalemia replete k 3.3.  3] anemia stable  4] hypertension stable  5] EtOH abuse treated with mild CIWA.  6] thrombocytopenia multifactorial possible alcohol abuse, patient on Lovenox DC Lovenox and follow-up labs tomorrow.     Discharge Instructions  Discharge Instructions    Activity as tolerated - No restrictions   Complete by:  As directed    Call MD for:  difficulty breathing, headache or visual disturbances   Complete by:  As directed    Call MD for:  persistant dizziness or light-headedness   Complete by:  As directed    Call MD for:  persistant nausea and vomiting   Complete by:  As directed    Call MD for:  redness, tenderness, or signs of infection (pain, swelling, redness, odor or green/yellow discharge around incision site)   Complete by:  As directed    Call MD for:  severe uncontrolled pain   Complete by:  As directed    Diet - low sodium heart healthy   Complete by:  As directed    Increase activity slowly   Complete by:  As directed      Allergies as of 06/23/2017   No Known Allergies     Medication List    STOP taking these medications   acetaminophen 500 MG tablet Commonly known as:  TYLENOL   Glycopyrrolate 1.5 MG Tabs Commonly known as:  GLYCATE     TAKE these medications   aspirin 81 MG tablet Take 81 mg by mouth daily.  cetirizine 10 MG tablet Commonly known as:  ZYRTEC Take 10 mg by mouth daily.   dicyclomine 20 MG tablet Commonly known as:  BENTYL Take 20 mg by mouth 2 (two) times daily.   esomeprazole 40 MG capsule Commonly known as:  NEXIUM take 1 capsule by mouth twice a day   gabapentin 100 MG  capsule Commonly known as:  NEURONTIN Take 100 mg by mouth 4 (four) times daily.   linaclotide 145 MCG Caps capsule Commonly known as:  LINZESS Take 1 capsule (145 mcg total) by mouth daily before breakfast.   LINZESS 72 MCG capsule Generic drug:  linaclotide take 1 capsule by mouth once daily before BREAKFAST   losartan 25 MG tablet Commonly known as:  COZAAR Take 25 mg by mouth daily.   mirtazapine 15 MG tablet Commonly known as:  REMERON Take 15 mg by mouth every evening.   ondansetron 4 MG tablet Commonly known as:  ZOFRAN Take 1 tablet (4 mg total) by mouth daily as needed for nausea or vomiting.   oxybutynin 5 MG tablet Commonly known as:  DITROPAN Take 5 mg by mouth every 8 (eight) hours as needed for bladder spasms.   pantoprazole 40 MG tablet Commonly known as:  PROTONIX Take 1 tablet (40 mg total) by mouth daily.   RESTASIS 0.05 % ophthalmic emulsion Generic drug:  cycloSPORINE Place 1 drop into both eyes 2 (two) times daily.   sucralfate 1 GM/10ML suspension Commonly known as:  CARAFATE Take 10 mLs (1 g total) by mouth 4 (four) times daily -  with meals and at bedtime.   tiZANidine 4 MG tablet Commonly known as:  ZANAFLEX Take 4 mg by mouth 2 (two) times daily as needed for muscle spasms.   Travoprost (BAK Free) 0.004 % Soln ophthalmic solution Commonly known as:  TRAVATAN Place 1 drop into both eyes at bedtime.      Follow-up Information    Nolene Ebbs, MD Follow up.   Specialty:  Internal Medicine Contact information: [place] [address] [phone]          No Known Allergies  Consultations:  gi   Procedures/Studies: Dg Chest 2 View  Result Date: 06/19/2017 CLINICAL DATA:  Confusion and weakness. EXAM: CHEST  2 VIEW COMPARISON:  Chest x-ray dated April 08, 2017. FINDINGS: The heart size and mediastinal contours are within normal limits. Normal pulmonary vascularity. No focal consolidation, pleural  effusion, or pneumothorax. No acute osseous abnormality. Unchanged thoracic kyphosis. IMPRESSION: No active cardiopulmonary disease. Electronically Signed   By: Titus Dubin M.D.   On: 06/19/2017 15:50   Ct Head Wo Contrast  Result Date: 06/19/2017 CLINICAL DATA:  77 y/o  F; altered mental status. EXAM: CT HEAD WITHOUT CONTRAST TECHNIQUE: Contiguous axial images were obtained from the base of the skull through the vertex without intravenous contrast. COMPARISON:  06/07/2013 CT head.  07/12/2013 MRI head. FINDINGS: Brain: No evidence of acute infarction, hemorrhage, hydrocephalus, extra-axial collection or mass lesion/mass effect. Stable chronic microvascular ischemic changes and parenchymal volume loss of the brain. Vascular: Calcific atherosclerosis of carotid siphons. No hyperdense vessel. Skull: Normal. Negative for fracture or focal lesion. Sinuses/Orbits: Small maxillary sinus mucous retention cyst. Left intra-ocular lens replacement. Otherwise negative. Other: None. IMPRESSION: 1. No acute intracranial abnormality identified. 2. Stable chronic microvascular ischemic changes and parenchymal volume loss of the brain. Electronically Signed   By: Kristine Garbe M.D.   On: 06/19/2017 17:02    (Echo, Carotid, EGD, Colonoscopy, ERCP)  Subjective:   Discharge Exam: Vitals:   06/22/17 2047 06/23/17 0511  BP: (!) 145/80 (!) 150/78  Pulse: 80 84  Resp: 17 16  Temp: 98 F (36.7 C) 97.8 F (36.6 C)  SpO2: 100% 100%   Vitals:   06/22/17 1356 06/22/17 2047 06/23/17 0511 06/23/17 0600  BP: (!) 160/77 (!) 145/80 (!) 150/78   Pulse: 86 80 84   Resp:  17 16   Temp: (!) 97.5 F (36.4 C) 98 F (36.7 C) 97.8 F (36.6 C)   TempSrc: Oral Oral Oral   SpO2: 100% 100% 100%   Weight:    69.2 kg (152 lb 8.9 oz)  Height:    5\' 3"  (1.6 m)    General: Pt is alert, awake, not in acute distress Cardiovascular: RRR, S1/S2 +, no rubs, no gallops Respiratory: CTA bilaterally, no wheezing, no  rhonchi Abdominal: Soft, NT, ND, bowel sounds + Extremities: no edema, no cyanosis    The results of significant diagnostics from this hospitalization (including imaging, microbiology, ancillary and laboratory) are listed below for reference.     Microbiology: No results found for this or any previous visit (from the past 240 hour(s)).   Labs: BNP (last 3 results) No results for input(s): BNP in the last 8760 hours. Basic Metabolic Panel: Recent Labs  Lab 06/19/17 1019 06/19/17 1336 06/20/17 0417 06/21/17 0309 06/22/17 0531 06/23/17 0441  NA 139  --  139 139 139 139  K 4.6  --  2.6* 3.2* 3.3* 3.3*  CL 106  --  105 107 109 107  CO2 14*  --  23 25 21* 25  GLUCOSE 108*  --  116* 108* 97 83  BUN 13  --  <5* <5* <5* <5*  CREATININE 0.96  --  0.71 0.62 0.72 0.60  CALCIUM 8.3*  --  7.3* 7.5* 7.6* 7.9*  MG  --  1.5* 1.6* 2.1  --   --    Liver Function Tests: Recent Labs  Lab 06/19/17 1336 06/23/17 0441  AST 43* 27  ALT 31 17  ALKPHOS 138* 90  BILITOT 1.1 0.7  PROT 6.8 5.1*  ALBUMIN 3.7 2.8*   Recent Labs  Lab 06/19/17 1336  LIPASE 22   No results for input(s): AMMONIA in the last 168 hours. CBC: Recent Labs  Lab 06/19/17 1019 06/21/17 0309 06/23/17 0441  WBC 10.2 7.1 6.7  NEUTROABS  --  4.1 3.4  HGB 13.3 10.5* 10.2*  HCT 40.6 32.6* 31.5*  MCV 76.0* 74.8* 75.7*  PLT 197 133* 121*   Cardiac Enzymes: No results for input(s): CKTOTAL, CKMB, CKMBINDEX, TROPONINI in the last 168 hours. BNP: Invalid input(s): POCBNP CBG: No results for input(s): GLUCAP in the last 168 hours. D-Dimer No results for input(s): DDIMER in the last 72 hours. Hgb A1c No results for input(s): HGBA1C in the last 72 hours. Lipid Profile No results for input(s): CHOL, HDL, LDLCALC, TRIG, CHOLHDL, LDLDIRECT in the last 72 hours. Thyroid function studies No results for input(s): TSH, T4TOTAL, T3FREE, THYROIDAB in the last 72 hours.  Invalid input(s): FREET3 Anemia work up No  results for input(s): VITAMINB12, FOLATE, FERRITIN, TIBC, IRON, RETICCTPCT in the last 72 hours. Urinalysis    Component Value Date/Time   COLORURINE AMBER (A) 06/19/2017 1621   APPEARANCEUR CLEAR 06/19/2017 1621   LABSPEC 1.024 06/19/2017 1621   PHURINE 5.0 06/19/2017 1621   GLUCOSEU NEGATIVE 06/19/2017 1621   GLUCOSEU NEGATIVE 06/22/2014 1426   HGBUR LARGE (A) 06/19/2017 1621  [place] NEGATIVE 06/19/2017 1621   KETONESUR NEGATIVE 06/19/2017 1621   PROTEINUR 30 (A) 06/19/2017 1621   UROBILINOGEN 1.0 02/23/2015 1917   NITRITE NEGATIVE 06/19/2017 1621   LEUKOCYTESUR NEGATIVE 06/19/2017 1621   Sepsis Labs Invalid input(s): PROCALCITONIN,  WBC,  LACTICIDVEN Microbiology No results found for this or any previous visit (from the past 240 hour(s)).   Time coordinating discharge: Over 30 minutes  SIGNED:   Georgette Shell, MD  [facility] 06/23/2017, 8:18 AM Pager   If 7PM-7AM, please contact night-coverage [email or web address] Password [email or web address]

## 2017-06-23 NOTE — Progress Notes (Signed)
Pt wants to go home this morning. Dr Rodena Piety came in with discharge order. Pt tolerated regular diet for breakfast. Discharge instructions given to pt and brother in law. Discharged to home accompanied by brother in law.

## 2017-06-24 ENCOUNTER — Ambulatory Visit: Payer: Medicare Other | Admitting: Hematology

## 2017-06-24 ENCOUNTER — Telehealth: Payer: Self-pay | Admitting: Hematology

## 2017-06-24 ENCOUNTER — Telehealth: Payer: Self-pay | Admitting: *Deleted

## 2017-06-24 ENCOUNTER — Other Ambulatory Visit: Payer: Self-pay

## 2017-06-24 MED ORDER — PROMETHAZINE HCL 12.5 MG PO TABS: 12.5000 mg | ORAL_TABLET | Freq: Four times a day (QID) | ORAL | 0 refills | Status: DC | PRN

## 2017-06-24 NOTE — Telephone Encounter (Signed)
I have spoken with the patient and instructed her on this.  Rx sent to [facility] on [address].

## 2017-06-24 NOTE — Telephone Encounter (Signed)
Appointment rescheduled per 2/21 Sch Msg/ Letter mailed to patient also.

## 2017-06-24 NOTE — Telephone Encounter (Signed)
Left a message to call back.

## 2017-06-24 NOTE — Telephone Encounter (Signed)
OK, will schedule her f/u in a few months.   Truitt Merle MD

## 2017-06-24 NOTE — Telephone Encounter (Signed)
Call received from patient in reference to "Cancelling today's appointment.  Was discharged from the hospital yesterday.  I do not feel good and too weak to come in today".  Call transferred to collabortaive for best help with this matter.  No appointment today or future appointments noted.

## 2017-06-28 ENCOUNTER — Other Ambulatory Visit: Payer: Self-pay | Admitting: Internal Medicine

## 2017-06-28 DIAGNOSIS — Z1231 Encounter for screening mammogram for malignant neoplasm of breast: Secondary | ICD-10-CM

## 2017-06-30 ENCOUNTER — Ambulatory Visit (INDEPENDENT_AMBULATORY_CARE_PROVIDER_SITE_OTHER): Payer: Medicare Other | Admitting: Gastroenterology

## 2017-06-30 ENCOUNTER — Encounter: Payer: Self-pay | Admitting: Gastroenterology

## 2017-06-30 VITALS — BP 144/90 | HR 72 | Ht 63.0 in | Wt 139.5 lb

## 2017-06-30 DIAGNOSIS — K581 Irritable bowel syndrome with constipation: Secondary | ICD-10-CM

## 2017-06-30 DIAGNOSIS — F411 Generalized anxiety disorder: Secondary | ICD-10-CM | POA: Diagnosis not present

## 2017-06-30 DIAGNOSIS — R110 Nausea: Secondary | ICD-10-CM

## 2017-06-30 DIAGNOSIS — K219 Gastro-esophageal reflux disease without esophagitis: Secondary | ICD-10-CM | POA: Diagnosis not present

## 2017-06-30 DIAGNOSIS — R42 Dizziness and giddiness: Secondary | ICD-10-CM | POA: Diagnosis not present

## 2017-06-30 DIAGNOSIS — K5909 Other constipation: Secondary | ICD-10-CM | POA: Diagnosis not present

## 2017-06-30 MED ORDER — LINACLOTIDE 72 MCG PO CAPS: 72.0000 ug | ORAL_CAPSULE | Freq: Every day | ORAL | 3 refills | Status: DC

## 2017-06-30 MED ORDER — PROMETHAZINE HCL 12.5 MG PO TABS: 12.5000 mg | ORAL_TABLET | Freq: Four times a day (QID) | ORAL | 0 refills | Status: DC | PRN

## 2017-06-30 MED ORDER — RANITIDINE HCL 300 MG PO TABS: 300.0000 mg | ORAL_TABLET | Freq: Every day | ORAL | 3 refills | Status: DC
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
## 2017-06-30 NOTE — Patient Instructions (Signed)
We will send in Ranitidine 300 mg daily at bedtime to your pharmacy  Continue Nexium twice a day  We will send in [place] and promethazine to your pharmacy

## 2017-07-05 ENCOUNTER — Telehealth: Payer: Self-pay | Admitting: Gastroenterology

## 2017-07-05 MED ORDER — ESOMEPRAZOLE MAGNESIUM 40 MG PO CPDR: 40.0000 mg | DELAYED_RELEASE_CAPSULE | Freq: Two times a day (BID) | ORAL | 4 refills | Status: DC

## 2017-07-05 NOTE — Telephone Encounter (Signed)
90 day supply of Nexium sent today

## 2017-07-05 NOTE — Telephone Encounter (Signed)
Patient states she needs a new prescription of nexium sent in to [facility]. Pt states she had ov on 2.27.19.

## 2017-07-06 ENCOUNTER — Encounter (HOSPITAL_COMMUNITY): Payer: Self-pay

## 2017-07-06 ENCOUNTER — Ambulatory Visit (HOSPITAL_COMMUNITY)
Admission: RE | Admit: 2017-07-06 | Discharge: 2017-07-06 | Disposition: A | Discharge status: Home / Self Care | Payer: Medicare Other | Source: Ambulatory Visit | Attending: Hematology | Admitting: Hematology

## 2017-07-06 DIAGNOSIS — R9389 Abnormal findings on diagnostic imaging of other specified body structures: Secondary | ICD-10-CM | POA: Insufficient documentation

## 2017-07-06 DIAGNOSIS — C7A8 Other malignant neuroendocrine tumors: Secondary | ICD-10-CM | POA: Diagnosis present

## 2017-07-06 DIAGNOSIS — I700 Atherosclerosis of aorta: Secondary | ICD-10-CM | POA: Insufficient documentation

## 2017-07-06 DIAGNOSIS — K228 Other specified diseases of esophagus: Secondary | ICD-10-CM | POA: Diagnosis not present

## 2017-07-06 DIAGNOSIS — K449 Diaphragmatic hernia without obstruction or gangrene: Secondary | ICD-10-CM | POA: Insufficient documentation

## 2017-07-06 DIAGNOSIS — N200 Calculus of kidney: Secondary | ICD-10-CM | POA: Diagnosis not present

## 2017-07-06 MED ORDER — SODIUM CHLORIDE 0.9 % IJ SOLN
INTRAMUSCULAR | Status: AC
  Filled 2017-07-06: qty 50

## 2017-07-06 MED ORDER — IOPAMIDOL (ISOVUE-300) INJECTION 61%
INTRAVENOUS | Status: AC
  Administered 2017-07-06: 100 mL via INTRAVENOUS
  Filled 2017-07-06: qty 100

## 2017-07-06 MED ORDER — IOPAMIDOL (ISOVUE-300) INJECTION 61%
100.0000 mL | Freq: Once | INTRAVENOUS | Status: AC | PRN
  Administered 2017-07-06: 100 mL via INTRAVENOUS

## 2017-07-08 ENCOUNTER — Telehealth: Payer: Self-pay | Admitting: Gastroenterology

## 2017-07-08 NOTE — Telephone Encounter (Signed)
Claiborne Billings the physical therapist calling to get verbal sign off for patient to see her with home health 1-2x a week for 4-5 weeks.

## 2017-07-08 NOTE — Telephone Encounter (Signed)
Left a message for the PT therapist. Advised she should contact the PCP or the ordering MD

## 2017-07-16 ENCOUNTER — Ambulatory Visit: Payer: Medicare Other

## 2017-07-19 ENCOUNTER — Ambulatory Visit
Admission: RE | Admit: 2017-07-19 | Discharge: 2017-07-19 | Disposition: A | Discharge status: Home / Self Care | Payer: Medicare Other | Source: Ambulatory Visit | Attending: Internal Medicine | Admitting: Internal Medicine

## 2017-07-19 DIAGNOSIS — Z1231 Encounter for screening mammogram for malignant neoplasm of breast: Secondary | ICD-10-CM

## 2017-07-20 ENCOUNTER — Other Ambulatory Visit: Payer: Self-pay | Admitting: Internal Medicine

## 2017-07-20 DIAGNOSIS — R928 Other abnormal and inconclusive findings on diagnostic imaging of breast: Secondary | ICD-10-CM
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
## 2017-07-22 ENCOUNTER — Inpatient Hospital Stay: Payer: Medicare Other | Attending: Hematology | Admitting: Hematology

## 2017-07-22 ENCOUNTER — Telehealth: Payer: Self-pay | Admitting: Hematology

## 2017-07-22 ENCOUNTER — Encounter: Payer: Self-pay | Admitting: Hematology

## 2017-07-22 ENCOUNTER — Inpatient Hospital Stay: Payer: Medicare Other

## 2017-07-22 VITALS — BP 181/84 | HR 88 | Temp 98.5°F | Resp 19 | Ht 63.0 in | Wt 136.9 lb

## 2017-07-22 DIAGNOSIS — E119 Type 2 diabetes mellitus without complications: Secondary | ICD-10-CM | POA: Diagnosis not present

## 2017-07-22 DIAGNOSIS — F17210 Nicotine dependence, cigarettes, uncomplicated: Secondary | ICD-10-CM | POA: Insufficient documentation

## 2017-07-22 DIAGNOSIS — Z716 Tobacco abuse counseling: Secondary | ICD-10-CM | POA: Diagnosis not present

## 2017-07-22 DIAGNOSIS — N200 Calculus of kidney: Secondary | ICD-10-CM | POA: Diagnosis not present

## 2017-07-22 DIAGNOSIS — K219 Gastro-esophageal reflux disease without esophagitis: Secondary | ICD-10-CM | POA: Insufficient documentation

## 2017-07-22 DIAGNOSIS — Z7982 Long term (current) use of aspirin: Secondary | ICD-10-CM | POA: Insufficient documentation

## 2017-07-22 DIAGNOSIS — K2270 Barrett's esophagus without dysplasia: Secondary | ICD-10-CM | POA: Insufficient documentation

## 2017-07-22 DIAGNOSIS — K859 Acute pancreatitis without necrosis or infection, unspecified: Secondary | ICD-10-CM | POA: Insufficient documentation

## 2017-07-22 DIAGNOSIS — I10 Essential (primary) hypertension: Secondary | ICD-10-CM | POA: Insufficient documentation

## 2017-07-22 DIAGNOSIS — R0789 Other chest pain: Secondary | ICD-10-CM | POA: Diagnosis not present

## 2017-07-22 DIAGNOSIS — C7A8 Other malignant neuroendocrine tumors: Secondary | ICD-10-CM | POA: Diagnosis not present

## 2017-07-22 DIAGNOSIS — Z9049 Acquired absence of other specified parts of digestive tract: Secondary | ICD-10-CM | POA: Insufficient documentation

## 2017-07-22 DIAGNOSIS — I700 Atherosclerosis of aorta: Secondary | ICD-10-CM | POA: Insufficient documentation

## 2017-07-22 DIAGNOSIS — C181 Malignant neoplasm of appendix: Secondary | ICD-10-CM | POA: Insufficient documentation

## 2017-07-22 DIAGNOSIS — Z79899 Other long term (current) drug therapy: Secondary | ICD-10-CM | POA: Insufficient documentation

## 2017-07-22 DIAGNOSIS — R109 Unspecified abdominal pain: Secondary | ICD-10-CM | POA: Insufficient documentation

## 2017-07-22 DIAGNOSIS — K589 Irritable bowel syndrome without diarrhea: Secondary | ICD-10-CM | POA: Insufficient documentation

## 2017-07-22 DIAGNOSIS — R079 Chest pain, unspecified: Secondary | ICD-10-CM | POA: Diagnosis not present

## 2017-07-22 LAB — CBC WITH DIFFERENTIAL/PLATELET
Basophils Absolute: 0 10*3/uL (ref 0.0–0.1)
Basophils Relative: 0 %
Eosinophils Absolute: 0 10*3/uL (ref 0.0–0.5)
Eosinophils Relative: 0 %
HCT: 36.3 % (ref 34.8–46.6)
Hemoglobin: 11.8 g/dL (ref 11.6–15.9)
Lymphocytes Relative: 25 %
Lymphs Abs: 1.9 10*3/uL (ref 0.9–3.3)
MCH: 24.7 pg — ABNORMAL LOW (ref 25.1–34.0)
MCHC: 32.5 g/dL (ref 31.5–36.0)
MCV: 75.9 fL — ABNORMAL LOW (ref 79.5–101.0)
Monocytes Absolute: 0.5 10*3/uL (ref 0.1–0.9)
Monocytes Relative: 7 %
Neutro Abs: 5.3 10*3/uL (ref 1.5–6.5)
Neutrophils Relative %: 68 %
Platelets: 212 10*3/uL (ref 145–400)
RBC: 4.78 MIL/uL (ref 3.70–5.45)
RDW: 15.1 % — ABNORMAL HIGH (ref 11.2–14.5)
WBC: 7.8 10*3/uL (ref 3.9–10.3)

## 2017-07-22 LAB — COMPREHENSIVE METABOLIC PANEL
ALT: 17 U/L (ref 0–55)
AST: 30 U/L (ref 5–34)
Albumin: 3.4 g/dL — ABNORMAL LOW (ref 3.5–5.0)
Alkaline Phosphatase: 100 U/L (ref 40–150)
Anion gap: 11 (ref 3–11)
BUN: 10 mg/dL (ref 7–26)
CO2: 22 mmol/L (ref 22–29)
Calcium: 8.6 mg/dL (ref 8.4–10.4)
Chloride: 107 mmol/L (ref 98–109)
Creatinine, Ser: 0.75 mg/dL (ref 0.60–1.10)
GFR calc Af Amer: >60 mL/min (ref >60)
GFR calc non Af Amer: >60 mL/min (ref >60)
Glucose, Bld: 94 mg/dL (ref 70–140)
Potassium: 3.4 mmol/L — ABNORMAL LOW (ref 3.5–5.1)
Sodium: 140 mmol/L (ref 136–145)
Total Bilirubin: 0.4 mg/dL (ref 0.2–1.2)
Total Protein: 6.7 g/dL (ref 6.4–8.3)

## 2017-07-22 NOTE — Telephone Encounter (Signed)
Scheduled appt per 3/21 los - Gave patient AVS and calender per los.  

## 2017-07-26 ENCOUNTER — Ambulatory Visit
Admission: RE | Admit: 2017-07-26 | Discharge: 2017-07-26 | Disposition: A | Discharge status: Home / Self Care | Payer: Medicare Other | Source: Ambulatory Visit | Attending: Internal Medicine | Admitting: Internal Medicine

## 2017-07-26 ENCOUNTER — Other Ambulatory Visit: Payer: Self-pay | Admitting: Internal Medicine

## 2017-07-26 DIAGNOSIS — R928 Other abnormal and inconclusive findings on diagnostic imaging of breast: Secondary | ICD-10-CM

## 2017-07-27 LAB — CHROMOGRANIN A: Chromogranin A: 1 nmol/L (ref 0–5)

## 2017-07-30 ENCOUNTER — Ambulatory Visit
Admission: RE | Admit: 2017-07-30 | Discharge: 2017-07-30 | Disposition: A | Discharge status: Home / Self Care | Payer: Medicare Other | Source: Ambulatory Visit | Attending: Internal Medicine | Admitting: Internal Medicine

## 2017-07-30 ENCOUNTER — Other Ambulatory Visit: Payer: Self-pay | Admitting: Internal Medicine

## 2017-07-30 DIAGNOSIS — R928 Other abnormal and inconclusive findings on diagnostic imaging of breast: Secondary | ICD-10-CM

## 2017-08-10 ENCOUNTER — Emergency Department (HOSPITAL_COMMUNITY): Payer: Medicare Other

## 2017-08-10 ENCOUNTER — Encounter (HOSPITAL_COMMUNITY): Payer: Self-pay

## 2017-08-10 ENCOUNTER — Emergency Department (HOSPITAL_COMMUNITY)
Admission: EM | Admit: 2017-08-10 | Discharge: 2017-08-10 | Disposition: A | Discharge status: Home / Self Care | Payer: Medicare Other | Attending: Emergency Medicine | Admitting: Emergency Medicine

## 2017-08-10 ENCOUNTER — Other Ambulatory Visit: Payer: Self-pay

## 2017-08-10 DIAGNOSIS — Z7982 Long term (current) use of aspirin: Secondary | ICD-10-CM | POA: Insufficient documentation

## 2017-08-10 DIAGNOSIS — Z79899 Other long term (current) drug therapy: Secondary | ICD-10-CM | POA: Diagnosis not present

## 2017-08-10 DIAGNOSIS — I10 Essential (primary) hypertension: Secondary | ICD-10-CM | POA: Insufficient documentation

## 2017-08-10 DIAGNOSIS — N200 Calculus of kidney: Secondary | ICD-10-CM | POA: Insufficient documentation

## 2017-08-10 DIAGNOSIS — E119 Type 2 diabetes mellitus without complications: Secondary | ICD-10-CM | POA: Insufficient documentation

## 2017-08-10 DIAGNOSIS — R1030 Lower abdominal pain, unspecified: Secondary | ICD-10-CM | POA: Diagnosis present

## 2017-08-10 DIAGNOSIS — F17210 Nicotine dependence, cigarettes, uncomplicated: Secondary | ICD-10-CM | POA: Diagnosis not present

## 2017-08-10 LAB — COMPREHENSIVE METABOLIC PANEL
ALT: 17 U/L (ref 14–54)
AST: 25 U/L (ref 15–41)
Albumin: 3.6 g/dL (ref 3.5–5.0)
Alkaline Phosphatase: 107 U/L (ref 38–126)
Anion gap: 11 (ref 5–15)
BUN: 7 mg/dL (ref 6–20)
CO2: 22 mmol/L (ref 22–32)
Calcium: 8.8 mg/dL — ABNORMAL LOW (ref 8.9–10.3)
Chloride: 107 mmol/L (ref 101–111)
Creatinine, Ser: 0.7 mg/dL (ref 0.44–1.00)
GFR calc Af Amer: >60 mL/min (ref >60)
GFR calc non Af Amer: >60 mL/min (ref >60)
Glucose, Bld: 101 mg/dL — ABNORMAL HIGH (ref 65–99)
Potassium: 3.6 mmol/L (ref 3.5–5.1)
Sodium: 140 mmol/L (ref 135–145)
Total Bilirubin: 0.4 mg/dL (ref 0.3–1.2)
Total Protein: 6.7 g/dL (ref 6.5–8.1)

## 2017-08-10 LAB — URINALYSIS, ROUTINE W REFLEX MICROSCOPIC
Bilirubin Urine: NEGATIVE
Glucose, UA: NEGATIVE mg/dL
Ketones, ur: NEGATIVE mg/dL
Leukocytes, UA: NEGATIVE
Nitrite: NEGATIVE
Protein, ur: 30 mg/dL — AB
Specific Gravity, Urine: 1.009 (ref 1.005–1.030)
pH: 7 (ref 5.0–8.0)

## 2017-08-10 LAB — CBC
HCT: 37.8 % (ref 36.0–46.0)
Hemoglobin: 12.5 g/dL (ref 12.0–15.0)
MCH: 25.1 pg — ABNORMAL LOW (ref 26.0–34.0)
MCHC: 33.1 g/dL (ref 30.0–36.0)
MCV: 75.8 fL — ABNORMAL LOW (ref 78.0–100.0)
Platelets: 228 10*3/uL (ref 150–400)
RBC: 4.99 MIL/uL (ref 3.87–5.11)
RDW: 15.6 % — ABNORMAL HIGH (ref 11.5–15.5)
WBC: 13 10*3/uL — ABNORMAL HIGH (ref 4.0–10.5)

## 2017-08-10 LAB — LIPASE, BLOOD: Lipase: 32 U/L (ref 11–51)

## 2017-08-10 MED ORDER — PROCHLORPERAZINE EDISYLATE 5 MG/ML IJ SOLN
5.0000 mg | Freq: Once | INTRAMUSCULAR | Status: AC
  Administered 2017-08-10: 5 mg via INTRAVENOUS
  Filled 2017-08-10: qty 2

## 2017-08-10 MED ORDER — IOPAMIDOL (ISOVUE-300) INJECTION 61%
INTRAVENOUS | Status: AC
  Administered 2017-08-10: 100 mL via INTRAVENOUS
  Filled 2017-08-10: qty 100

## 2017-08-10 MED ORDER — MORPHINE SULFATE (PF) 4 MG/ML IV SOLN
2.0000 mg | Freq: Once | INTRAVENOUS | Status: AC
  Administered 2017-08-10: 2 mg via INTRAVENOUS
  Filled 2017-08-10: qty 1

## 2017-08-10 MED ORDER — ONDANSETRON HCL 4 MG/2ML IJ SOLN
4.0000 mg | Freq: Once | INTRAMUSCULAR | Status: AC
  Administered 2017-08-10: 4 mg via INTRAVENOUS
  Filled 2017-08-10: qty 2

## 2017-08-10 MED ORDER — MORPHINE SULFATE 15 MG PO TABS: 15.0000 mg | ORAL_TABLET | ORAL | 0 refills | Status: DC | PRN

## 2017-08-10 MED ORDER — PROMETHAZINE HCL 25 MG PO TABS: 25.0000 mg | ORAL_TABLET | Freq: Four times a day (QID) | ORAL | 0 refills | Status: DC | PRN

## 2017-08-10 MED ORDER — HYDROMORPHONE HCL 1 MG/ML IJ SOLN
0.2500 mg | Freq: Once | INTRAMUSCULAR | Status: AC
  Administered 2017-08-10: 0.25 mg via INTRAVENOUS
  Filled 2017-08-10: qty 1

## 2017-08-10 MED ORDER — TAMSULOSIN HCL 0.4 MG PO CAPS: 0.4000 mg | ORAL_CAPSULE | Freq: Every day | ORAL | 0 refills | Status: DC

## 2017-08-10 MED ORDER — SODIUM CHLORIDE 0.9 % IV BOLUS
1000.0000 mL | Freq: Once | INTRAVENOUS | Status: AC
  Administered 2017-08-10: 1000 mL via INTRAVENOUS

## 2017-08-10 MED ORDER — IOPAMIDOL (ISOVUE-300) INJECTION 61%
100.0000 mL | Freq: Once | INTRAVENOUS | Status: AC
  Administered 2017-08-10: 100 mL via INTRAVENOUS

## 2017-08-10 NOTE — Discharge Instructions (Signed)
Take tylenol 1000mg(2 extra strength) four times a day.  ° °

## 2017-08-10 NOTE — ED Notes (Signed)
Patient transported to CT 
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
## 2017-08-10 NOTE — ED Notes (Signed)
Patient verbalized understanding of discharge instructions and denies any further needs or questions at this time. VS stable. Patient ambulatory with steady gait. Assisted to ED entrance in wheelchair.   

## 2017-08-10 NOTE — ED Notes (Signed)
Pt returned from CT °

## 2017-08-10 NOTE — ED Triage Notes (Signed)
Patient complains of generalized abdominal pain with decreased appetite since the weekend. No constipation, no vomiting. Denies fever.

## 2017-08-11 ENCOUNTER — Other Ambulatory Visit: Payer: Self-pay | Admitting: Urology

## 2017-08-11 ENCOUNTER — Encounter (HOSPITAL_BASED_OUTPATIENT_CLINIC_OR_DEPARTMENT_OTHER): Payer: Self-pay

## 2017-08-12 ENCOUNTER — Other Ambulatory Visit: Payer: Self-pay

## 2017-08-12 ENCOUNTER — Encounter (HOSPITAL_BASED_OUTPATIENT_CLINIC_OR_DEPARTMENT_OTHER): Payer: Self-pay

## 2017-08-12 NOTE — Progress Notes (Signed)
Spoke with:  Ciel NPO:  After Midnight, no gum, candy, or mints  No smoking after midnight Arrival time: 0930AM Labs:  None AM medications: Dicyclomine, Esomeprazole, Gabapentin, Losartan Pre op orders: yes Ride home:  [name] (daughter) [phone]

## 2017-08-13 ENCOUNTER — Ambulatory Visit (HOSPITAL_BASED_OUTPATIENT_CLINIC_OR_DEPARTMENT_OTHER)
Admission: RE | Admit: 2017-08-13 | Discharge: 2017-08-13 | Disposition: A | Discharge status: Home / Self Care | Payer: Medicare Other | Source: Ambulatory Visit | Attending: Urology | Admitting: Urology

## 2017-08-13 ENCOUNTER — Encounter (HOSPITAL_BASED_OUTPATIENT_CLINIC_OR_DEPARTMENT_OTHER): Payer: Self-pay | Admitting: *Deleted

## 2017-08-13 ENCOUNTER — Ambulatory Visit (HOSPITAL_BASED_OUTPATIENT_CLINIC_OR_DEPARTMENT_OTHER): Payer: Medicare Other | Admitting: Anesthesiology

## 2017-08-13 ENCOUNTER — Encounter (HOSPITAL_BASED_OUTPATIENT_CLINIC_OR_DEPARTMENT_OTHER)
Admission: RE | Disposition: A | Discharge status: Home / Self Care | Payer: Self-pay | Source: Ambulatory Visit | Attending: Urology

## 2017-08-13 DIAGNOSIS — Z87442 Personal history of urinary calculi: Secondary | ICD-10-CM | POA: Insufficient documentation

## 2017-08-13 DIAGNOSIS — I10 Essential (primary) hypertension: Secondary | ICD-10-CM | POA: Insufficient documentation

## 2017-08-13 DIAGNOSIS — G4730 Sleep apnea, unspecified: Secondary | ICD-10-CM | POA: Diagnosis not present

## 2017-08-13 DIAGNOSIS — F419 Anxiety disorder, unspecified: Secondary | ICD-10-CM | POA: Diagnosis not present

## 2017-08-13 DIAGNOSIS — Z903 Acquired absence of stomach [part of]: Secondary | ICD-10-CM | POA: Diagnosis not present

## 2017-08-13 DIAGNOSIS — K2270 Barrett's esophagus without dysplasia: Secondary | ICD-10-CM | POA: Insufficient documentation

## 2017-08-13 DIAGNOSIS — K219 Gastro-esophageal reflux disease without esophagitis: Secondary | ICD-10-CM | POA: Insufficient documentation

## 2017-08-13 DIAGNOSIS — D649 Anemia, unspecified: Secondary | ICD-10-CM | POA: Diagnosis not present

## 2017-08-13 DIAGNOSIS — Z800 Family history of malignant neoplasm of digestive organs: Secondary | ICD-10-CM | POA: Diagnosis not present

## 2017-08-13 DIAGNOSIS — Z8249 Family history of ischemic heart disease and other diseases of the circulatory system: Secondary | ICD-10-CM | POA: Insufficient documentation

## 2017-08-13 DIAGNOSIS — F329 Major depressive disorder, single episode, unspecified: Secondary | ICD-10-CM | POA: Diagnosis not present

## 2017-08-13 DIAGNOSIS — I4510 Unspecified right bundle-branch block: Secondary | ICD-10-CM | POA: Diagnosis not present

## 2017-08-13 DIAGNOSIS — G44209 Tension-type headache, unspecified, not intractable: Secondary | ICD-10-CM | POA: Diagnosis not present

## 2017-08-13 DIAGNOSIS — E1143 Type 2 diabetes mellitus with diabetic autonomic (poly)neuropathy: Secondary | ICD-10-CM | POA: Insufficient documentation

## 2017-08-13 DIAGNOSIS — E538 Deficiency of other specified B group vitamins: Secondary | ICD-10-CM | POA: Diagnosis not present

## 2017-08-13 DIAGNOSIS — K3184 Gastroparesis: Secondary | ICD-10-CM | POA: Insufficient documentation

## 2017-08-13 DIAGNOSIS — F17210 Nicotine dependence, cigarettes, uncomplicated: Secondary | ICD-10-CM | POA: Diagnosis not present

## 2017-08-13 DIAGNOSIS — K449 Diaphragmatic hernia without obstruction or gangrene: Secondary | ICD-10-CM | POA: Insufficient documentation

## 2017-08-13 DIAGNOSIS — Z86718 Personal history of other venous thrombosis and embolism: Secondary | ICD-10-CM | POA: Insufficient documentation

## 2017-08-13 DIAGNOSIS — N132 Hydronephrosis with renal and ureteral calculous obstruction: Secondary | ICD-10-CM | POA: Diagnosis present

## 2017-08-13 DIAGNOSIS — R000 Tachycardia, unspecified: Secondary | ICD-10-CM | POA: Diagnosis not present

## 2017-08-13 DIAGNOSIS — Z9049 Acquired absence of other specified parts of digestive tract: Secondary | ICD-10-CM | POA: Insufficient documentation

## 2017-08-13 DIAGNOSIS — I700 Atherosclerosis of aorta: Secondary | ICD-10-CM | POA: Diagnosis not present

## 2017-08-13 DIAGNOSIS — M479 Spondylosis, unspecified: Secondary | ICD-10-CM | POA: Insufficient documentation

## 2017-08-13 HISTORY — DX: Unspecified hydronephrosis: N13.30

## 2017-08-13 HISTORY — DX: Personal history of urinary calculi: Z87.442

## 2017-08-13 HISTORY — DX: Personal history of other diseases of the circulatory system: Z86.79

## 2017-08-13 HISTORY — DX: Cardiac murmur, unspecified: R01.1

## 2017-08-13 HISTORY — DX: Pneumonia, unspecified organism: J18.9

## 2017-08-13 HISTORY — DX: Acidosis: E87.2

## 2017-08-13 HISTORY — DX: Tension-type headache, unspecified, not intractable: G44.209

## 2017-08-13 HISTORY — DX: Pain in right knee: M25.562

## 2017-08-13 HISTORY — DX: Pain in left knee: M25.561

## 2017-08-13 HISTORY — DX: Nausea: R11.0

## 2017-08-13 HISTORY — DX: Personal history of other diseases of the digestive system: Z87.19

## 2017-08-13 HISTORY — DX: Abdominal distension (gaseous): R14.0

## 2017-08-13 HISTORY — DX: Low back pain: M54.5

## 2017-08-13 HISTORY — DX: Anxiety disorder, unspecified: F41.9

## 2017-08-13 HISTORY — DX: Dehydration: E86.0

## 2017-08-13 HISTORY — DX: Deficiency of other specified B group vitamins: E53.8

## 2017-08-13 HISTORY — PX: CYSTOSCOPY/URETEROSCOPY/HOLMIUM LASER/STENT PLACEMENT: SHX6546

## 2017-08-13 HISTORY — DX: Other malignant neuroendocrine tumors: C7A.8

## 2017-08-13 HISTORY — DX: Hydroureter: N13.4

## 2017-08-13 HISTORY — DX: Atherosclerosis of aorta: I70.0

## 2017-08-13 HISTORY — DX: Low back pain, unspecified: M54.50

## 2017-08-13 HISTORY — DX: Other acidosis: E87.29

## 2017-08-13 HISTORY — DX: Hematuria, unspecified: R31.9

## 2017-08-13 HISTORY — DX: Unspecified right bundle-branch block: I45.10

## 2017-08-13 LAB — GLUCOSE, CAPILLARY
Glucose-Capillary: 95 mg/dL (ref 65–99)
Glucose-Capillary: 121 mg/dL — ABNORMAL HIGH (ref 65–99)

## 2017-08-13 SURGERY — CYSTOSCOPY/URETEROSCOPY/HOLMIUM LASER/STENT PLACEMENT
Anesthesia: General | Site: Renal | Laterality: Right

## 2017-08-13 MED ORDER — SUCCINYLCHOLINE CHLORIDE 200 MG/10ML IV SOSY
PREFILLED_SYRINGE | INTRAVENOUS | Status: DC | PRN
  Administered 2017-08-13: 80 mg via INTRAVENOUS

## 2017-08-13 MED ORDER — FENTANYL CITRATE (PF) 100 MCG/2ML IJ SOLN
INTRAMUSCULAR | Status: DC | PRN
  Administered 2017-08-13 (×2): 25 ug via INTRAVENOUS
  Administered 2017-08-13: 50 ug via INTRAVENOUS

## 2017-08-13 MED ORDER — FENTANYL CITRATE (PF) 100 MCG/2ML IJ SOLN
INTRAMUSCULAR | Status: AC
  Filled 2017-08-13: qty 2

## 2017-08-13 MED ORDER — CIPROFLOXACIN IN D5W 400 MG/200ML IV SOLN
INTRAVENOUS | Status: AC
  Filled 2017-08-13: qty 200

## 2017-08-13 MED ORDER — MORPHINE SULFATE 15 MG PO TABS: 15.0000 mg | ORAL_TABLET | ORAL | 0 refills | Status: DC | PRN

## 2017-08-13 MED ORDER — ONDANSETRON HCL 4 MG/2ML IJ SOLN
INTRAMUSCULAR | Status: DC | PRN
  Administered 2017-08-13: 4 mg via INTRAVENOUS

## 2017-08-13 MED ORDER — LIDOCAINE 2% (20 MG/ML) 5 ML SYRINGE
INTRAMUSCULAR | Status: DC | PRN
  Administered 2017-08-13: 40 mg via INTRAVENOUS
  Administered 2017-08-13: 60 mg via INTRAVENOUS

## 2017-08-13 MED ORDER — FENTANYL CITRATE (PF) 100 MCG/2ML IJ SOLN
25.0000 ug | INTRAMUSCULAR | Status: DC | PRN
  Filled 2017-08-13: qty 1

## 2017-08-13 MED ORDER — LACTATED RINGERS IV SOLN
INTRAVENOUS | Status: DC
  Administered 2017-08-13: 10:00:00 via INTRAVENOUS
  Filled 2017-08-13: qty 1000

## 2017-08-13 MED ORDER — ARTIFICIAL TEARS OPHTHALMIC OINT
TOPICAL_OINTMENT | OPHTHALMIC | Status: AC
  Filled 2017-08-13: qty 3.5

## 2017-08-13 MED ORDER — PROPOFOL 10 MG/ML IV BOLUS
INTRAVENOUS | Status: DC | PRN
  Administered 2017-08-13: 100 mg via INTRAVENOUS

## 2017-08-13 MED ORDER — LIDOCAINE 2% (20 MG/ML) 5 ML SYRINGE
INTRAMUSCULAR | Status: AC
  Filled 2017-08-13: qty 5

## 2017-08-13 MED ORDER — DEXAMETHASONE SODIUM PHOSPHATE 10 MG/ML IJ SOLN
INTRAMUSCULAR | Status: DC | PRN
  Administered 2017-08-13: 5 mg via INTRAVENOUS

## 2017-08-13 MED ORDER — SODIUM CHLORIDE 0.9 % IR SOLN
Status: DC | PRN
  Administered 2017-08-13: 4000 mL

## 2017-08-13 MED ORDER — CIPROFLOXACIN IN D5W 400 MG/200ML IV SOLN
400.0000 mg | Freq: Once | INTRAVENOUS | Status: AC
  Administered 2017-08-13: 400 mg via INTRAVENOUS
  Filled 2017-08-13: qty 200

## 2017-08-13 MED ORDER — DEXAMETHASONE SODIUM PHOSPHATE 10 MG/ML IJ SOLN
INTRAMUSCULAR | Status: AC
  Filled 2017-08-13: qty 1

## 2017-08-13 MED ORDER — PROPOFOL 10 MG/ML IV BOLUS
INTRAVENOUS | Status: AC
  Filled 2017-08-13: qty 20

## 2017-08-13 MED ORDER — CIPROFLOXACIN HCL 500 MG PO TABS: 500.0000 mg | ORAL_TABLET | Freq: Two times a day (BID) | ORAL | 0 refills | Status: AC

## 2017-08-13 MED ORDER — PHENAZOPYRIDINE HCL 200 MG PO TABS: 200.0000 mg | ORAL_TABLET | Freq: Three times a day (TID) | ORAL | 0 refills | Status: DC | PRN

## 2017-08-13 MED ORDER — PROMETHAZINE HCL 25 MG/ML IJ SOLN
6.2500 mg | INTRAMUSCULAR | Status: DC | PRN
  Filled 2017-08-13: qty 1

## 2017-08-13 MED ORDER — ONDANSETRON HCL 4 MG/2ML IJ SOLN
INTRAMUSCULAR | Status: AC
  Filled 2017-08-13: qty 2

## 2017-08-13 SURGICAL SUPPLY — 36 items
APL SKNCLS STERI-STRIP NONHPOA (GAUZE/BANDAGES/DRESSINGS)
BAG DRAIN URO-CYSTO SKYTR STRL (DRAIN) ×2 IMPLANT
BAG DRN UROCATH (DRAIN) ×1
BASKET STONE 1.7 NGAGE (UROLOGICAL SUPPLIES) IMPLANT
BASKET ZERO TIP NITINOL 2.4FR (BASKET) ×2 IMPLANT
BENZOIN TINCTURE PRP APPL 2/3 (GAUZE/BANDAGES/DRESSINGS) IMPLANT
BSKT STON RTRVL ZERO TP 2.4FR (BASKET) ×1
CATH URET 5FR 28IN OPEN ENDED (CATHETERS) IMPLANT
CLOTH BEACON ORANGE TIMEOUT ST (SAFETY) ×2 IMPLANT
FIBER LASER FLEXIVA 365 (UROLOGICAL SUPPLIES) IMPLANT
FIBER LASER TRAC TIP (UROLOGICAL SUPPLIES) IMPLANT
GAUZE SPONGE 4X4 12PLY STRL LF (GAUZE/BANDAGES/DRESSINGS) ×1 IMPLANT
GLOVE BIO SURGEON STRL SZ7 (GLOVE) ×2 IMPLANT
GLOVE BIO SURGEON STRL SZ7.5 (GLOVE) ×2 IMPLANT
GLOVE BIOGEL PI IND STRL 7.0 (GLOVE) IMPLANT
GLOVE BIOGEL PI IND STRL 7.5 (GLOVE) IMPLANT
GLOVE BIOGEL PI INDICATOR 7.0 (GLOVE) ×2
GLOVE BIOGEL PI INDICATOR 7.5 (GLOVE) ×4
GOWN STRL REUS W/TWL LRG LVL3 (GOWN DISPOSABLE) ×3 IMPLANT
GOWN STRL REUS W/TWL XL LVL3 (GOWN DISPOSABLE) ×3 IMPLANT
GUIDEWIRE ANG ZIPWIRE 038X150 (WIRE) ×2 IMPLANT
GUIDEWIRE STR DUAL SENSOR (WIRE) IMPLANT
GUIDEWIRE ZIPWRE .038 STRAIGHT (WIRE) ×1 IMPLANT
INFUSOR MANOMETER BAG 3000ML (MISCELLANEOUS) ×2 IMPLANT
IV NS 1000ML (IV SOLUTION) ×4
IV NS 1000ML BAXH (IV SOLUTION) IMPLANT
IV NS IRRIG 3000ML ARTHROMATIC (IV SOLUTION) ×2 IMPLANT
KIT TURNOVER CYSTO (KITS) ×2 IMPLANT
MANIFOLD NEPTUNE II (INSTRUMENTS) ×2 IMPLANT
NS IRRIG 500ML POUR BTL (IV SOLUTION) ×4 IMPLANT
PACK CYSTO (CUSTOM PROCEDURE TRAY) ×2 IMPLANT
SET IRRIG Y TYPE TUR BLADDER L (SET/KITS/TRAYS/PACK) ×1 IMPLANT
STENT URET 6FRX24 CONTOUR (STENTS) ×1 IMPLANT
STRIP CLOSURE SKIN 1/2X4 (GAUZE/BANDAGES/DRESSINGS) IMPLANT
SYRINGE 10CC LL (SYRINGE) ×2 IMPLANT
TUBE CONNECTING 12X1/4 (SUCTIONS) IMPLANT

## 2017-08-13 NOTE — Anesthesia Preprocedure Evaluation (Addendum)
Anesthesia Evaluation  Patient identified by MRN, date of birth, ID band Patient awake    Reviewed: Allergy & Precautions, NPO status , Patient's Chart, lab work & pertinent test results  Airway Mallampati: II  TM Distance: >3 FB Neck ROM: Full    Dental  (+) Dental Advisory Given   Pulmonary sleep apnea , Current Smoker,    breath sounds clear to auscultation       Cardiovascular hypertension, Pt. on medications  Rhythm:Regular Rate:Normal     Neuro/Psych Depression negative neurological ROS     GI/Hepatic Neg liver ROS, GERD  ,S/p total gastrectomy   Endo/Other  diabetes, Type 2  Renal/GU Renal disease     Musculoskeletal   Abdominal   Peds  Hematology  (+) anemia ,   Anesthesia Other Findings   Reproductive/Obstetrics                             Lab Results  Component Value Date   WBC 13.0 (H) 08/10/2017   HGB 12.5 08/10/2017   HCT 37.8 08/10/2017   MCV 75.8 (L) 08/10/2017   PLT 228 08/10/2017   Lab Results  Component Value Date   CREATININE 0.70 08/10/2017   BUN 7 08/10/2017   NA 140 08/10/2017   K 3.6 08/10/2017   CL 107 08/10/2017   CO2 22 08/10/2017    Anesthesia Physical  Anesthesia Plan  ASA: III  Anesthesia Plan: General   Post-op Pain Management:    Induction: Intravenous and Rapid sequence  PONV Risk Score and Plan: 2 and Ondansetron, Dexamethasone and Treatment may vary due to age or medical condition  Airway Management Planned: Oral ETT  Additional Equipment:   Intra-op Plan:   Post-operative Plan: Extubation in OR  Informed Consent: I have reviewed the patients History and Physical, chart, labs and discussed the procedure including the risks, benefits and alternatives for the proposed anesthesia with the patient or authorized representative who has indicated his/her understanding and acceptance.   Dental advisory given  Plan Discussed with:  CRNA  Anesthesia Plan Comments:        Anesthesia Quick Evaluation

## 2017-08-13 NOTE — Discharge Instructions (Signed)
[facility] [phone] Post Ureteroscopy With or Without Stent Instructions  Definitions:  Ureter: The duct that transports urine from the kidney to the bladder. Stent:   A plastic hollow tube that is placed into the ureter, from the kidney to the bladder to prevent the ureter from swelling shut.  GENERAL INSTRUCTIONS:  Despite the fact that no skin incisions were used, the area around the ureter and bladder is raw and irritated. The stent is a foreign body which will further irritate the bladder wall. This irritation is manifested by increased frequency of urination, both day and night, and by an increase in the urge to urinate. In some, the urge to urinate is present almost always. Sometimes the urge is strong enough that you may not be able to stop yourself from urinating. The only real cure is to remove the stent and then give time for the bladder wall to heal which can't be done until the danger of the ureter swelling shut has passed, which varies.  You may see some blood in your urine while the stent is in place and a few days afterwards. Do not be alarmed, even if the urine was clear for a while. Get off your feet and drink lots of fluids until clearing occurs. If you start to pass clots or don't improve, call us.  DIET: You may return to your normal diet immediately. Because of the raw surface of your bladder, alcohol, spicy foods, acid type foods and drinks with caffeine may cause irritation or frequency and should be used in moderation. To keep your urine flowing freely and to avoid constipation, drink plenty of fluids during the day ( 8-10 glasses ). Tip: Avoid cranberry juice because it is very acidic.  ACTIVITY: Your physical activity doesn't need to be restricted. However, if you are very active, you may see some blood in your urine. We suggest that you reduce your activity under these circumstances until the bleeding has stopped.  BOWELS: It is important to  keep your bowels regular during the postoperative period. Straining with bowel movements can cause bleeding. A bowel movement every other day is reasonable. Use a mild laxative if needed, such as Milk of Magnesia 2-3 tablespoons, or 2 Dulcolax tablets. Call if you continue to have problems. If you have been taking narcotics for pain, before, during or after your surgery, you may be constipated. Take a laxative if necessary.   MEDICATION: You should resume your pre-surgery medications unless told not to. In addition you will often be given an antibiotic to prevent infection. These should be taken as prescribed until the bottles are finished unless you are having an unusual reaction to one of the drugs.  PROBLEMS YOU SHOULD REPORT TO [place]:  Fevers over 100.5 Fahrenheit.  Heavy bleeding, or clots ( See above notes about blood in urine ).  Inability to urinate.  Drug reactions ( hives, rash, nausea, vomiting, diarrhea ).  Severe burning or pain with urination that is not improving.  FOLLOW-UP: You will need a follow-up appointment to monitor your progress. Call for this appointment at the number listed above. Usually the first appointment will be about three to fourteen days after your surgery.      Post Anesthesia Home Care Instructions  Activity: Get plenty of rest for the remainder of the day. A responsible individual must stay with you for 24 hours following the procedure.  For the next 24 hours, DO NOT: -Drive a car -Paediatric nurse -Drink alcoholic beverages -Take  any medication unless instructed by your physician -Make any legal decisions or sign important papers.  Meals: Start with liquid foods such as gelatin or soup. Progress to regular foods as tolerated. Avoid greasy, spicy, heavy foods. If nausea and/or vomiting occur, drink only clear liquids until the nausea and/or vomiting subsides. Call your physician if vomiting continues.  Special Instructions/Symptoms: Your  throat may feel dry or sore from the anesthesia or the breathing tube placed in your throat during surgery. If this causes discomfort, gargle with warm salt water. The discomfort should resolve overnight.

## 2017-08-13 NOTE — Op Note (Signed)
Operative Note  Preoperative diagnosis:  1.  5 mm right UVJ stone  Postoperative diagnosis: 1.  Passed right UVJ stone  Procedure(s): 1.  Cystoscopy 2.  Right ureteroscopy 3.  Right JJ stent placement with tether  Surgeon: Ellison Hughs, MD  Assistants:  None   Anesthesia:  General   Complications:  Her right JJ stent was inadvertently removed by the OR staff at the conclusion of the case.  The patient was re-prepped and draped and the right JJ stent was replaced without any issues.  EBL:  <5 mL  Specimens: 1. None  Drains/Catheters: 1.  Right 6 [place] JJ stent with tether tucked in the vaginal vault  Intraoperative findings:   1. No evidence of right ureteral or renal pelvic stone  Indication:  Mckinzee Spirito is a 77 y.o. female with a 4 week history of worsening right sided flank pain. Possible remote history of stones. She describes the pain as intermittent, dull, radiates to the right inguinal region and is associated with nausea, but no vomiting. Denies dysuria hematuria or fever/chills. Had appendectomy last year. Her pain is currently 7 out of 10--currently taking PO morphine and phenergan, which alleviates her sxs. CTSS (08/10/17) 5 mm and 1-2 mm right UVJ stones with mild hydronephrosis.   She has been consented for the above procedures, voices understanding and wishes to proceed  Description of procedure:  After informed consent was obtained, the patient was brought to the operating room and general LMA anesthesia was administered. The patient was then placed in the dorsolithotomy position and prepped and draped in usual sterile fashion. A timeout was performed. A 23 French rigid cystoscope was then inserted into the urethral meatus and advanced into the bladder under direct vision. A complete bladder survey revealed no intravesical pathology.  A Glidewire was then used to intubate the right ureteral orifice and was advanced up to the right renal pelvis, under  fluoroscopic guidance.  A semirigid ureteroscope was then advanced alongside the wire and into the distal aspect of the right ureter.  There was a 2-3 cm area of ureteral erythema and edema, but no evidence of an obstructing stone.  The semirigid ureteroscope was then removed.  A flexible ureteroscope was then advanced over the wire and into position within the right renal pelvis.  Inspection of all right renal calyces revealed no evidence of a right-sided kidney stone.  The wire was then replaced through the flexible ureteroscope.  The ureteroscope was then removed, leaving the wire in place.  A 6 French by 24 cm right JJ stent was then placed over the wire and into good position within the right collecting system, confirming placement via fluoroscopy.  The tether of the stent was left intact  The patient's bladder was drained.    Upon removing the drapes, the tether of the stent was inadvertently pulled by the OR staff and her stent was retracted out through the urethra.  The patient was then reprepped and draped.  Her previously placed stent was removed.  A rigid cystoscope was then reinserted into the bladder and a Glidewire was advanced up to the right renal pelvis, under fluoroscopic guidance.  A new 6 [place] by 24 cm JJ stent was then placed over the wire and into good position within the right collecting system, confirming placement via fluoroscopy.  The patient's bladder was then drained.  She tolerated the procedure well and was transferred to the postanesthesia unit in stable condition.  Plan: Remove stent on 08/16/17. Follow-up  in 6 weeks for right renal ultrasound.

## 2017-08-13 NOTE — Anesthesia Procedure Notes (Signed)
Procedure Name: Intubation Date/Time: 08/13/2017 11:49 AM Performed by: Suzette Battiest, MD Pre-anesthesia Checklist: Patient identified, Emergency Drugs available, Suction available and Patient being monitored Patient Re-evaluated:Patient Re-evaluated prior to induction Oxygen Delivery Method: Circle system utilized Preoxygenation: Pre-oxygenation with 100% oxygen Induction Type: IV induction, Rapid sequence and Cricoid Pressure applied Laryngoscope Size: Mac and 3 Grade View: Grade I Tube type: Oral Tube size: 7.0 mm Number of attempts: 1 Airway Equipment and Method: Stylet and Oral airway Placement Confirmation: ETT inserted through vocal cords under direct vision,  positive ETCO2 and breath sounds checked- equal and bilateral Secured at: 22 cm Tube secured with: Tape Dental Injury: Teeth and Oropharynx as per pre-operative assessment
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
## 2017-08-13 NOTE — Transfer of Care (Signed)
  Last Vitals:  Vitals Value Taken Time  BP 183/80 08/13/2017 12:45 PM  Temp 36.5 C 08/13/2017 12:43 PM  Pulse 100 08/13/2017 12:47 PM  Resp 15 08/13/2017 12:47 PM  SpO2 96 % 08/13/2017 12:47 PM  Vitals shown include unvalidated device data.  Last Pain:  Vitals:   08/13/17 0934  TempSrc: Oral         Immediate Anesthesia Transfer of Care Note  Patient: Emily [name]  Procedure(s) Performed: Procedure(s) (LRB): CYSTOSCOPY/URETEROSCOPY/STENT PLACEMENT (Right)  Patient Location: PACU  Anesthesia Type: General  Level of Consciousness:drowsy  Airway & Oxygen Therapy: Patient Spontanous Breathing and Patient connected to nasal cannula oxygen  Post-op Assessment: Report given to PACU RN and Post -op Vital signs reviewed and stable  Post vital signs: Reviewed and stable  Complications: No apparent anesthesia complications

## 2017-08-14 IMAGING — MR MR LUMBAR SPINE W/O CM
4 of 5 series · 22 of 48 positions shown · non-contrast
Comparison: None.

CLINICAL DATA: Increasing low back pain

EXAM:
MRI LUMBAR SPINE WITHOUT CONTRAST
TECHNIQUE: Multiplanar, multisequence MR imaging of the lumbar spine was
performed. No intravenous contrast was administered.

[Series 6: T2 · sagittal · 4.0mm · 0.68mm/px · 6 of 15 slices shown (1 of 2)]
[im 1/15]
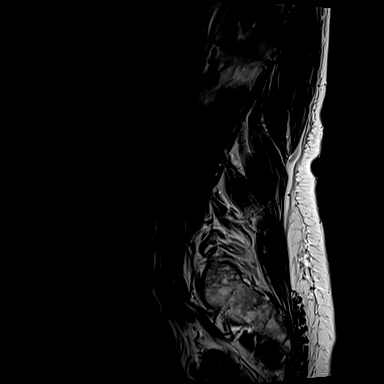
[im 3/15]
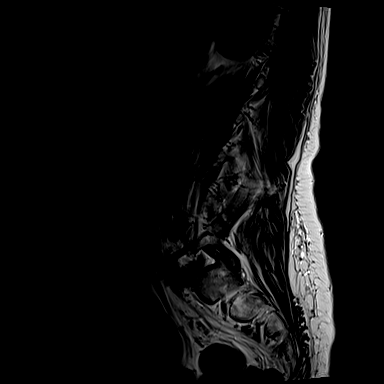
[im 6/15]
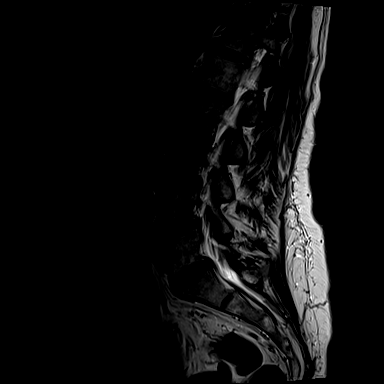
[im 9/15]
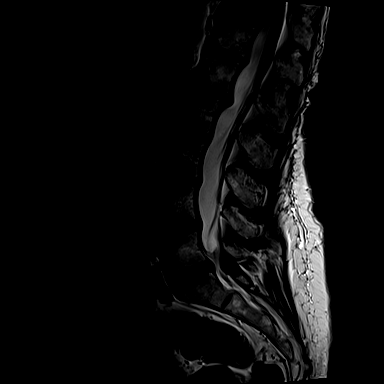
[im 12/15]
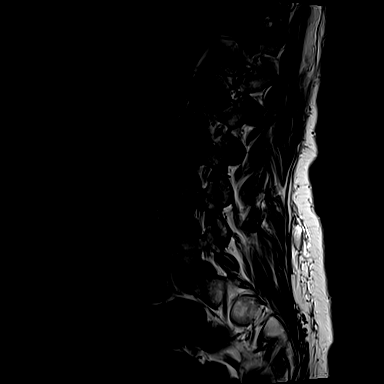
[im 15/15]
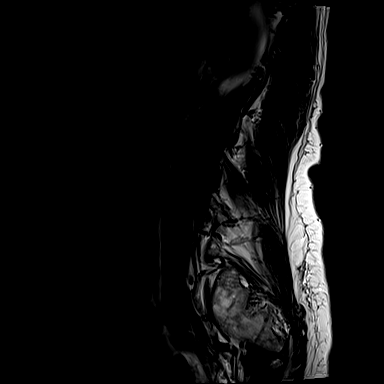

[Series 8: T1 · sagittal · 4.0mm · 0.68mm/px · 5 of 15 slices shown (1 of 2)]
[im 1/15]
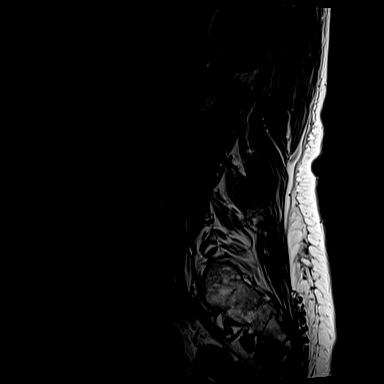
[im 3/15]
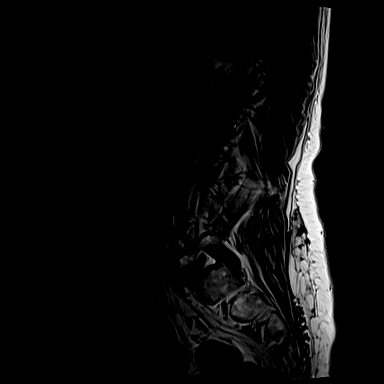
[im 5/15]
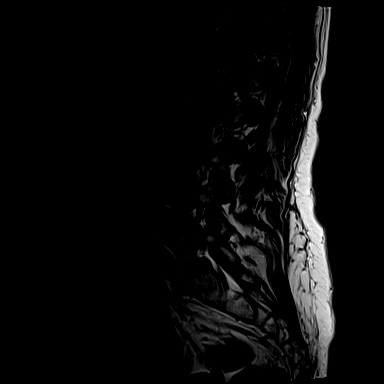
[im 8/15]
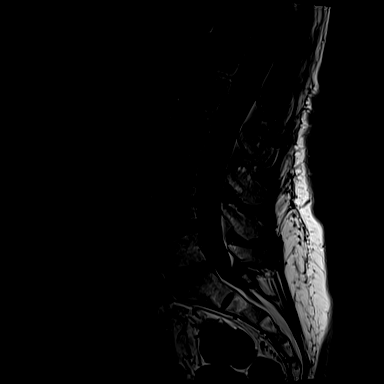
[im 12/15]
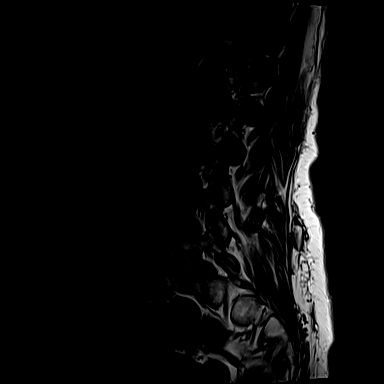

[Series 9: T1 · axial · 4.0mm · 0.56mm/px · z∈[+4,+124]mm · 3 of 31 slices shown (2 of 2)]
[im 5/31]
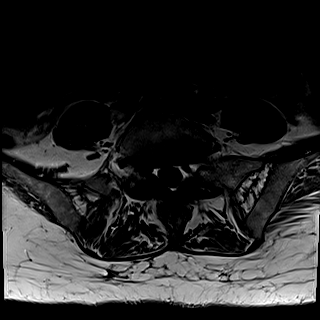
[im 17/31]
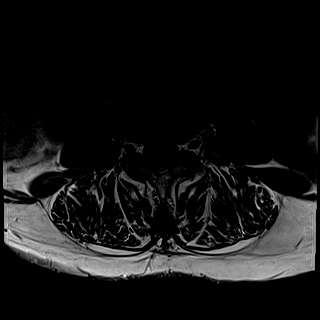
[im 26/31]
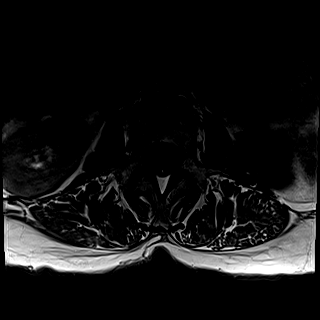

[Series 10: T2 · axial · 4.0mm · 0.28mm/px · z∈[-16,+148]mm · 8 of 31 slices shown (2 of 2)]
[im 1/31]
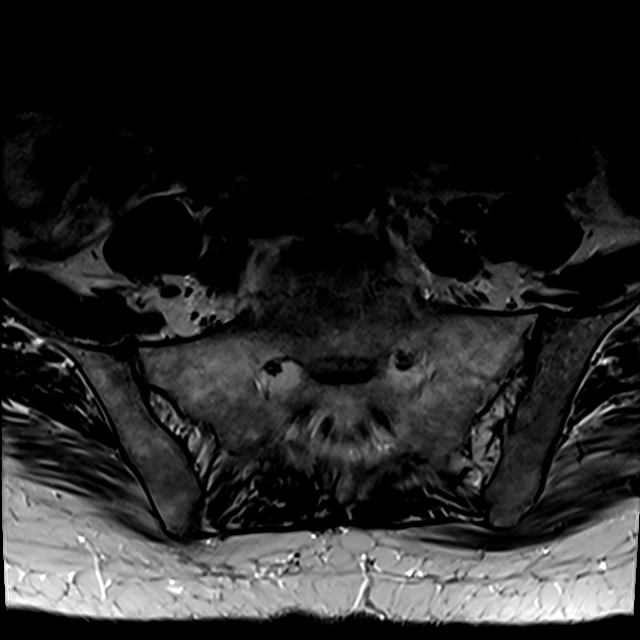
[im 5/31]
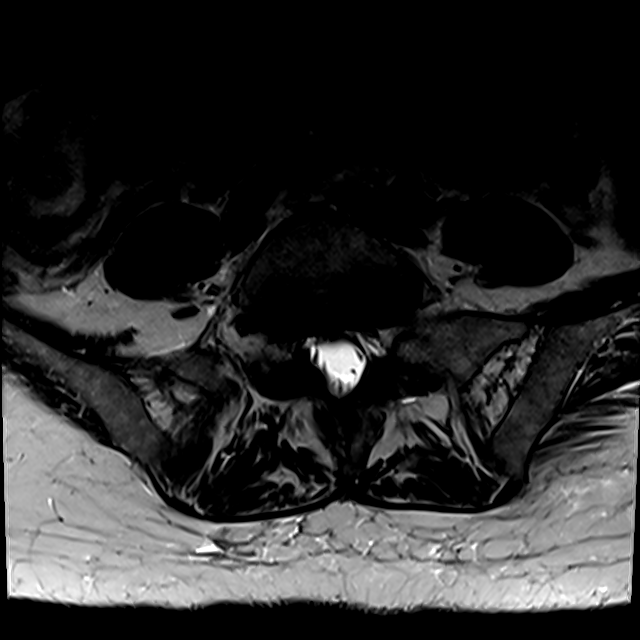
[im 10/31]
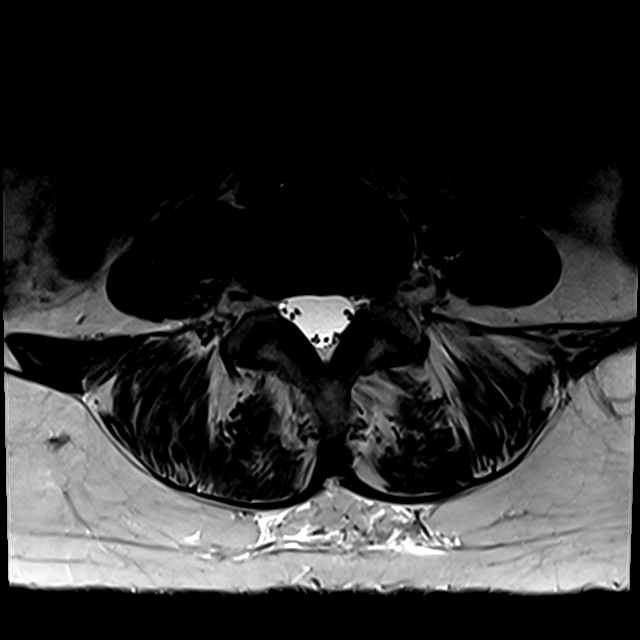
[im 14/31]
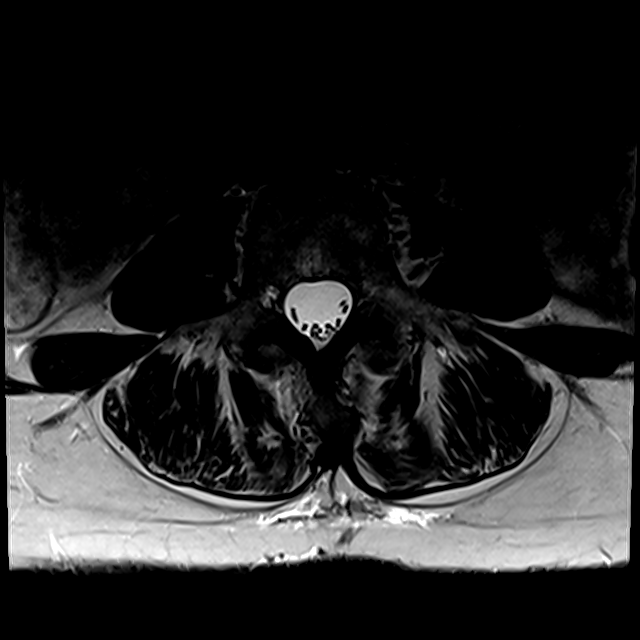
[im 17/31]
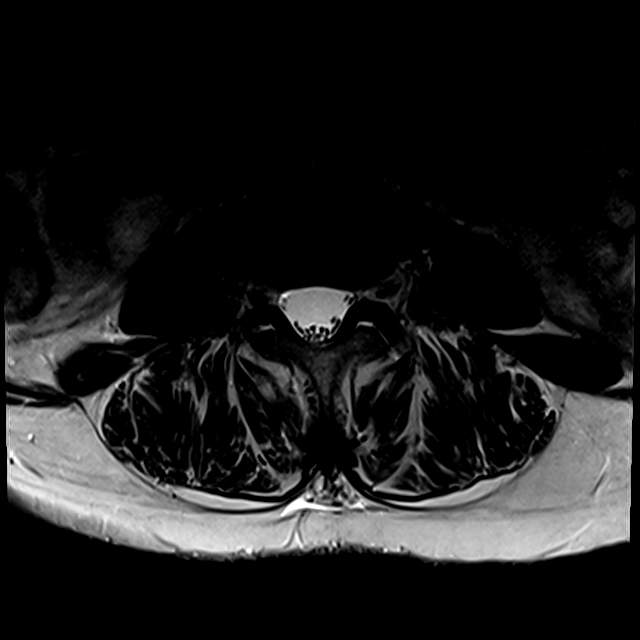
[im 21/31]
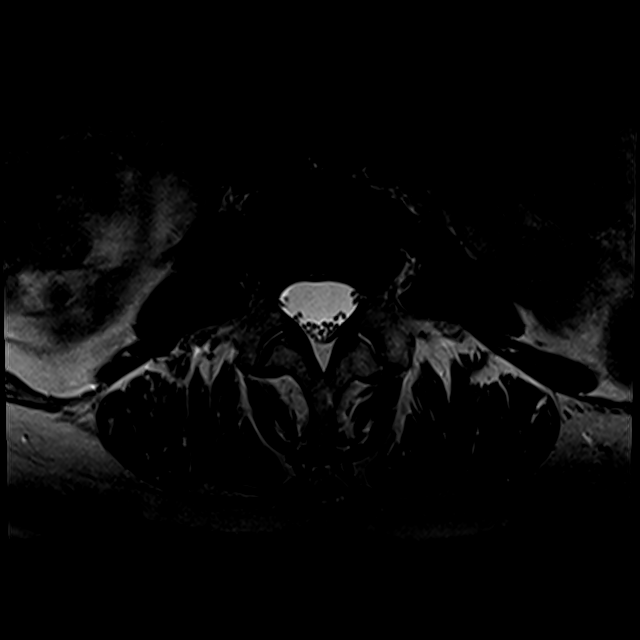
[im 26/31]
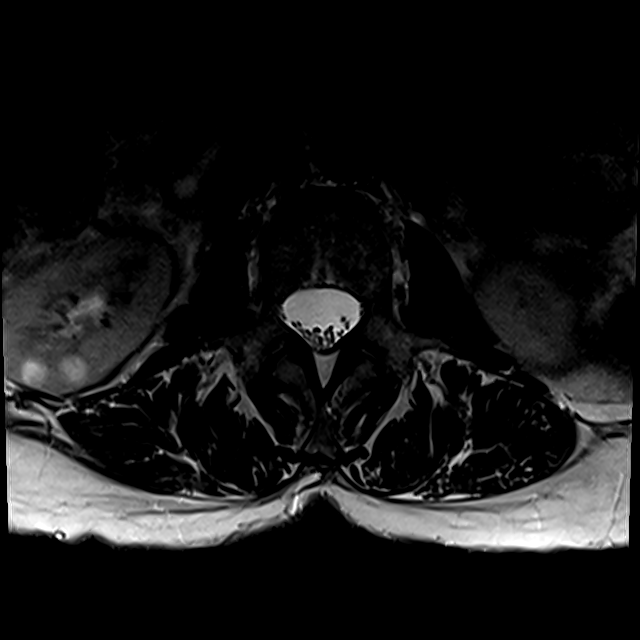
[im 31/31]
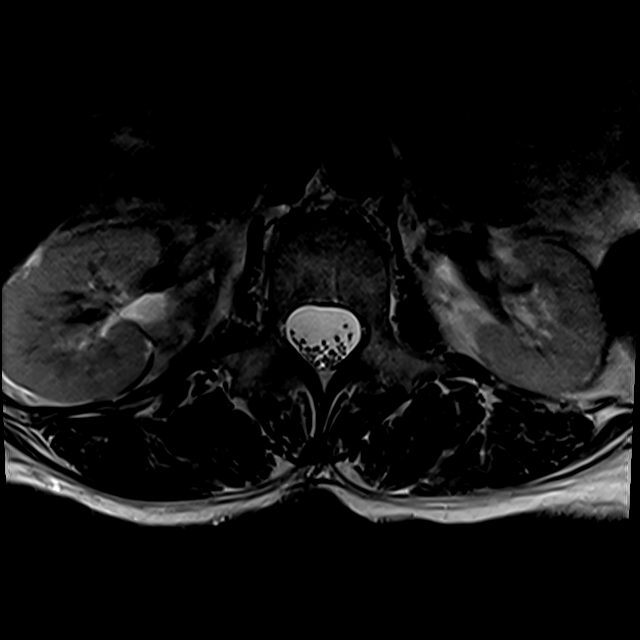

[22 of 48 positions shown; findings below may reference images not displayed]

FINDINGS: Segmentation:  Standard.

Alignment:  Physiologic.

Vertebrae:  No fracture, evidence of discitis, or bone lesion.

Conus medullaris: Extends to the L1 level and appears normal.

Paraspinal and other soft tissues: No paraspinal abnormality.

Disc levels:

Disc spaces: Degenerative disc disease with mild disc height loss at
L1-2.

T12-L1: No significant disc bulge. No evidence of neural foraminal
stenosis. No central canal stenosis.

L1-L2: Broad-based disc bulge eccentric towards the right. Mild
bilateral facet arthropathy. No evidence of neural foraminal
stenosis. No central canal stenosis.

L2-L3: No significant disc bulge. No evidence of neural foraminal
stenosis. No central canal stenosis.

L3-L4: Minimal broad-based disc bulge. No evidence of neural
foraminal stenosis. No central canal stenosis.

L4-L5: Mild broad-based disc bulge. No evidence of neural foraminal
stenosis. No central canal stenosis.

L5-S1: Broad-based disc bulge with bilateral facet arthropathy.
Right lateral recess stenosis stenosis. No evidence of neural
foraminal stenosis. No central canal stenosis.
IMPRESSION: 1. At L5-S1 there is a broad-based disc bulge with bilateral facet
arthropathy and right lateral recess stenosis.
2. At L1-2 there is a broad-based disc bulge eccentric towards the
right. Mild bilateral facet arthropathy.

## 2017-08-16 ENCOUNTER — Encounter (HOSPITAL_BASED_OUTPATIENT_CLINIC_OR_DEPARTMENT_OTHER): Payer: Self-pay | Admitting: Urology

## 2017-08-17 NOTE — Anesthesia Postprocedure Evaluation (Signed)
Anesthesia Post Note  Patient: Emily [name]  Procedure(s) Performed: CYSTOSCOPY/URETEROSCOPY/STENT PLACEMENT (Right Renal)     Patient location during evaluation: PACU Anesthesia Type: General Level of consciousness: awake and alert Pain management: pain level controlled Vital Signs Assessment: post-procedure vital signs reviewed and stable Respiratory status: spontaneous breathing, nonlabored ventilation, respiratory function stable and patient connected to nasal cannula oxygen Cardiovascular status: blood pressure returned to baseline and stable Postop Assessment: no apparent nausea or vomiting Anesthetic complications: no    Last Vitals:  Vitals:   08/13/17 1330 08/13/17 1402  BP: (!) 183/86 (!) 183/81  Pulse: 81 81  Resp: 15 18  Temp:  36.7 C  SpO2: 93% 95%    Last Pain:  Vitals:   08/13/17 1402  TempSrc:   PainSc: 1                  Tiajuana Amass

## 2017-08-18 ENCOUNTER — Other Ambulatory Visit: Payer: Self-pay | Admitting: Gastroenterology

## 2017-08-30 ENCOUNTER — Ambulatory Visit (INDEPENDENT_AMBULATORY_CARE_PROVIDER_SITE_OTHER): Payer: Medicaid Other | Admitting: Podiatry

## 2017-08-30 DIAGNOSIS — E0842 Diabetes mellitus due to underlying condition with diabetic polyneuropathy: Secondary | ICD-10-CM

## 2017-08-30 DIAGNOSIS — B351 Tinea unguium: Secondary | ICD-10-CM | POA: Diagnosis not present

## 2017-08-30 DIAGNOSIS — M79676 Pain in unspecified toe(s): Secondary | ICD-10-CM
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
## 2017-09-13 ENCOUNTER — Telehealth: Payer: Self-pay | Admitting: Gastroenterology

## 2017-09-13 NOTE — Telephone Encounter (Signed)
Patient describes a pain at her mid abdomen. She wants to know what is causing this. She takes Bentyl which helps sometimes. No diarrhea or constipation. She states she is aware that the pain is close to the area of her "feeding tube." She does not have a feeding tube now. Denies any flank pain, bloody stools or nausea. She states she spoke with her primary doctor who encouraged her to call us.  Appointment made for the patient to be evaluated and reassured.

## 2017-09-15 ENCOUNTER — Encounter: Payer: Self-pay | Admitting: Gastroenterology

## 2017-09-15 ENCOUNTER — Ambulatory Visit (INDEPENDENT_AMBULATORY_CARE_PROVIDER_SITE_OTHER): Payer: Medicare Other | Admitting: Gastroenterology

## 2017-09-15 VITALS — BP 142/82 | HR 104 | Ht 61.8 in | Wt 135.2 lb

## 2017-09-15 DIAGNOSIS — R109 Unspecified abdominal pain: Secondary | ICD-10-CM

## 2017-09-15 DIAGNOSIS — G8929 Other chronic pain: Secondary | ICD-10-CM

## 2017-09-15 MED ORDER — LIDOCAINE 5 % EX PTCH: 1.0000 | MEDICATED_PATCH | CUTANEOUS | 0 refills | Status: DC

## 2017-09-15 NOTE — Patient Instructions (Addendum)
We have sent the following medications to your pharmacy for you to pick up at your convenience:  Lidocaine patch 5% apply to abdomen daily for no more than 12 hours.   Use Voltaren gel on your abdomen.
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
## 2017-09-15 NOTE — Progress Notes (Signed)
Reviewed and agree with documentation and assessment and plan. K. Veena Mimie Goering , MD   

## 2017-09-30 ENCOUNTER — Other Ambulatory Visit: Payer: Self-pay | Admitting: Gastroenterology

## 2017-10-23 ENCOUNTER — Other Ambulatory Visit: Payer: Self-pay | Admitting: Gastroenterology

## 2017-10-28 ENCOUNTER — Other Ambulatory Visit: Payer: Self-pay | Admitting: Hematology

## 2017-10-28 ENCOUNTER — Telehealth: Payer: Self-pay | Admitting: Hematology

## 2017-10-28 ENCOUNTER — Telehealth: Payer: Self-pay

## 2017-10-28 DIAGNOSIS — C7A8 Other malignant neuroendocrine tumors: Secondary | ICD-10-CM
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
## 2017-10-28 NOTE — Telephone Encounter (Signed)
Left voice message for patient notifying her that Dr. Burr Medico wants her to have MRI of the brain, scheduled for this Saturday 6/29 at 11:30.  Also spoke to patient's daughter [name] with appointment.  Explained to patient she will be receiving a call from our schedulers for follow up with Dr. Burr Medico in 2 to 3 weeks.  Encouraged to call us back if she has questions.

## 2017-10-28 NOTE — Telephone Encounter (Signed)
Appointment scheduled and patient notified per 6/27 sch msg

## 2017-10-30 ENCOUNTER — Ambulatory Visit (HOSPITAL_COMMUNITY)
Admission: RE | Admit: 2017-10-30 | Discharge: 2017-10-30 | Disposition: A | Discharge status: Home / Self Care | Payer: Medicare Other | Source: Ambulatory Visit | Attending: Hematology | Admitting: Hematology

## 2017-10-30 DIAGNOSIS — I6381 Other cerebral infarction due to occlusion or stenosis of small artery: Secondary | ICD-10-CM | POA: Diagnosis not present

## 2017-10-30 DIAGNOSIS — C7A8 Other malignant neuroendocrine tumors: Secondary | ICD-10-CM | POA: Insufficient documentation

## 2017-10-30 DIAGNOSIS — I6782 Cerebral ischemia: Secondary | ICD-10-CM | POA: Diagnosis not present

## 2017-10-30 MED ORDER — GADOBENATE DIMEGLUMINE 529 MG/ML IV SOLN
15.0000 mL | Freq: Once | INTRAVENOUS | Status: AC | PRN
  Administered 2017-10-30: 13 mL via INTRAVENOUS

## 2017-10-31 IMAGING — MR MR KNEE*L* W/O CM
4 of 6 series · 15 of 40 positions shown · non-contrast
Comparison: None.

CLINICAL DATA: Chronic left knee pain.

EXAM:
MRI OF THE LEFT KNEE WITHOUT CONTRAST
TECHNIQUE: Multiplanar, multisequence MR imaging of the knee was performed. No
intravenous contrast was administered.

[Series 4: pd_tse_fs_cor · coronal · 3.2mm · 0.25mm/px · 3 of 24 slices shown]
[im 4/24]
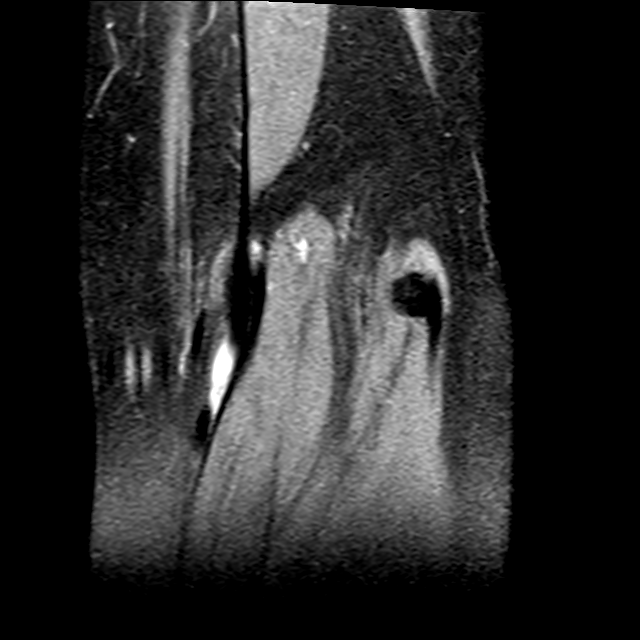
[im 12/24]
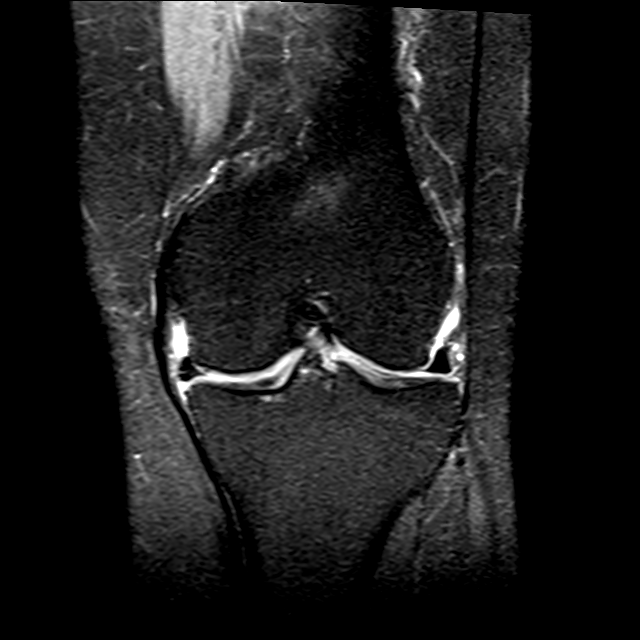
[im 20/24]
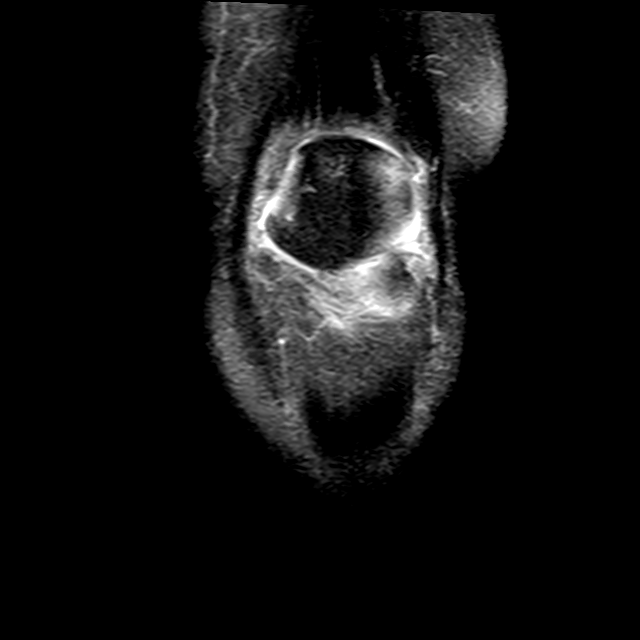

[Series 6: T2 fat-sat · coronal · 3.2mm · 0.62mm/px · 3 of 24 slices shown]
[im 4/24]
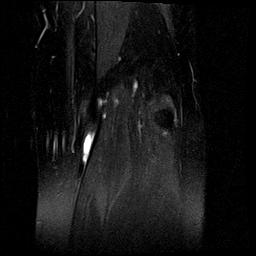
[im 12/24]
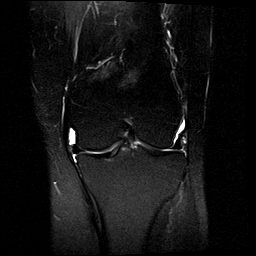
[im 20/24]
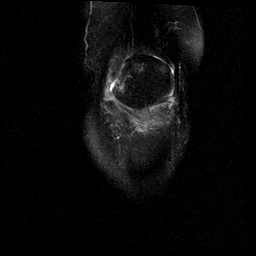

[Series 7: T1 · coronal · 3.2mm · 0.25mm/px · 3 of 24 slices shown]
[im 4/24]
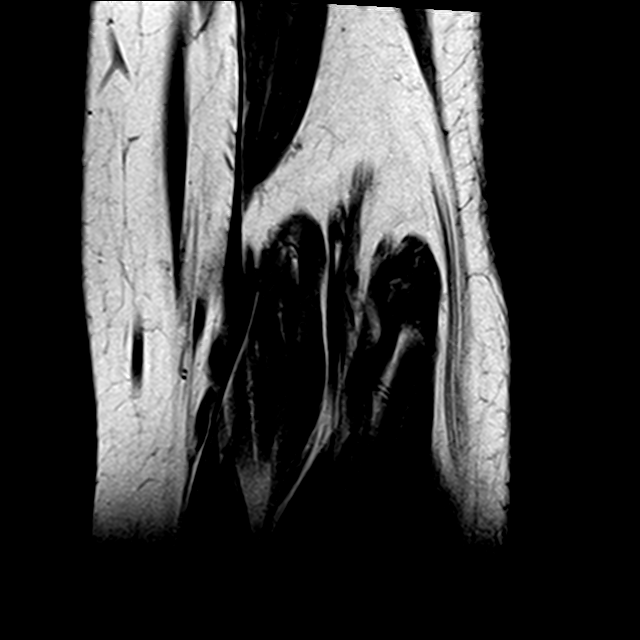
[im 12/24]
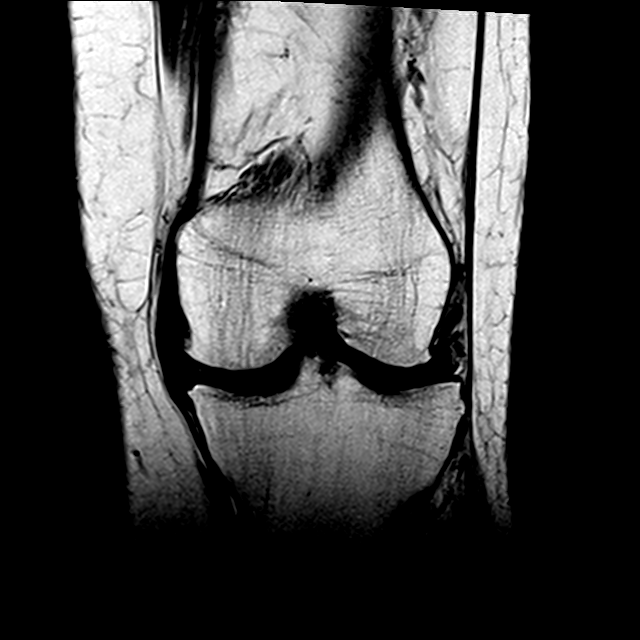
[im 20/24]
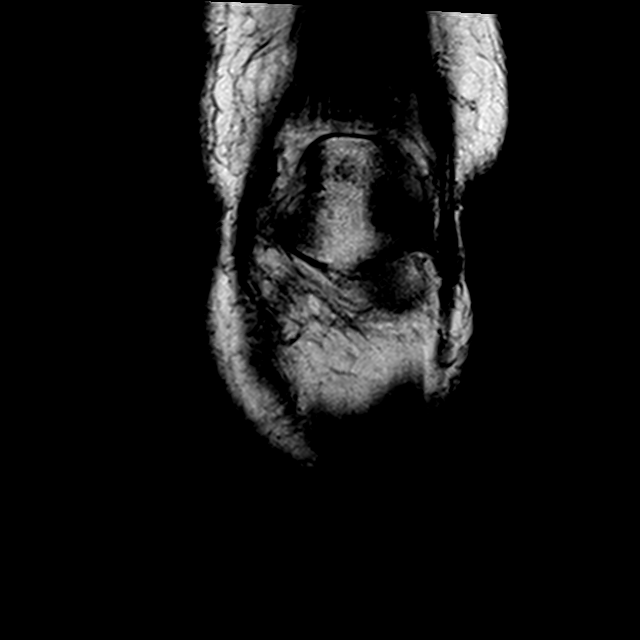

[Series 8: PD fat-sat · sagittal · 3.5mm · 0.25mm/px · 6 of 23 slices shown]
[im 1/23]
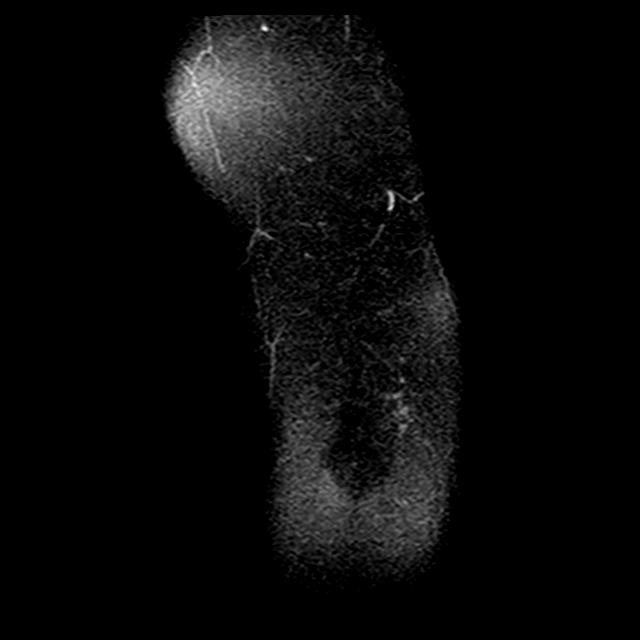
[im 4/23]
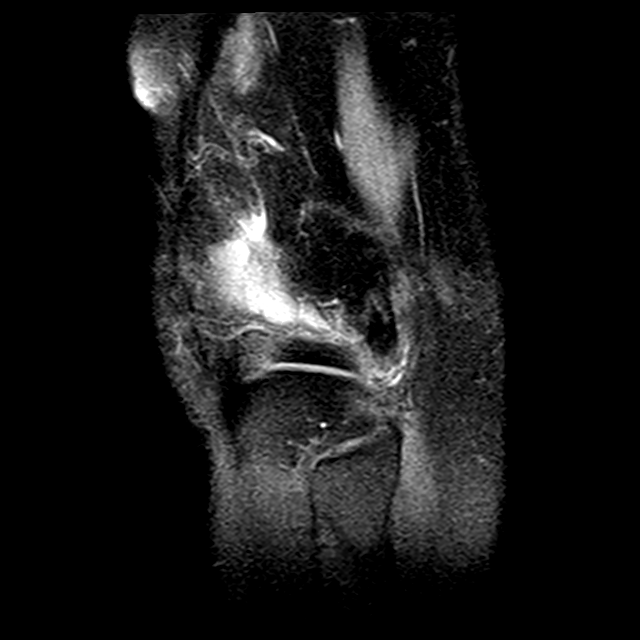
[im 8/23]
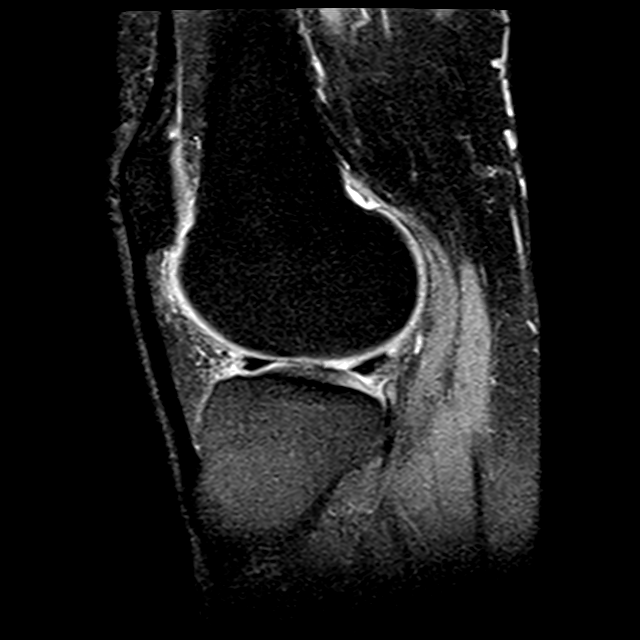
[im 12/23]
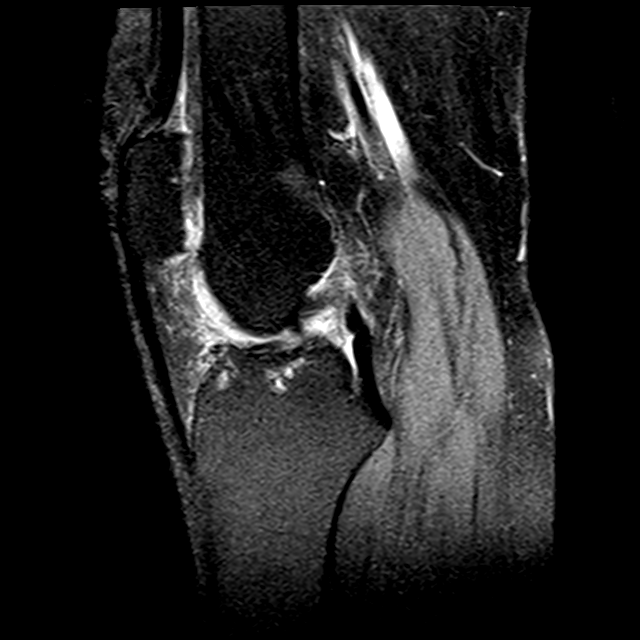
[im 15/23]
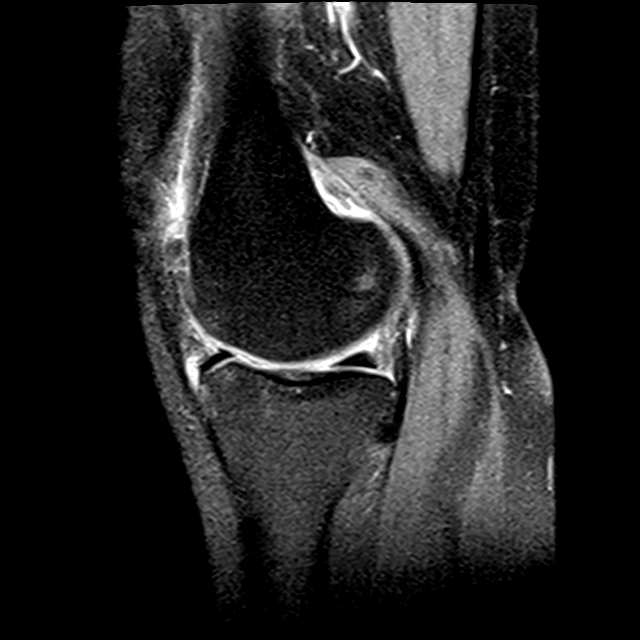
[im 19/23]
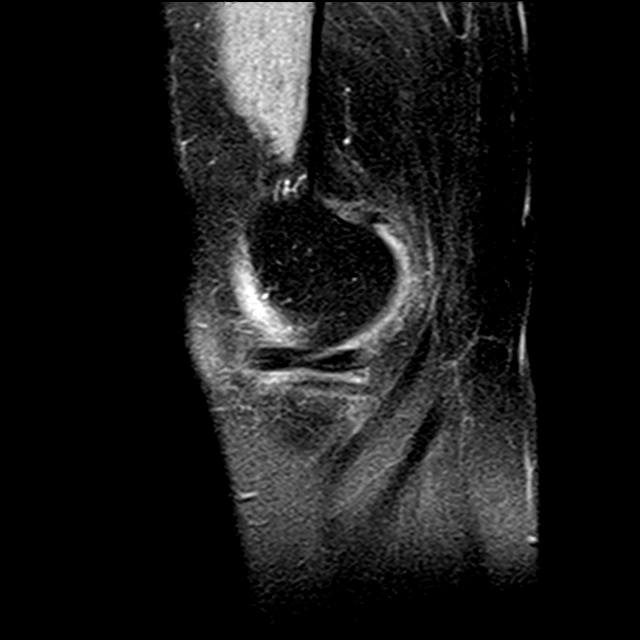

[15 of 40 positions shown; findings below may reference images not displayed]

FINDINGS: MENISCI

Medial meniscus:  Normal.

Lateral meniscus:  Normal.

LIGAMENTS

Cruciates:  Intact ACL and PCL.

Collaterals: Medial collateral ligament is intact. Lateral
collateral ligament complex is intact.

CARTILAGE

Patellofemoral: Extensive denuding of the articular cartilage of the
apex and medial facet of the patella.

Medial: Grade 4 chondromalacia of the inner aspect of medial tibial
plateau.

Lateral:  Normal.

Joint: Physiologic amount of joint fluid. Slight edema in Hoffa's
fat pad just below the patella, probably due to the patellar
chondromalacia.

Popliteal Fossa:  Tiny Baker's cyst.

Extensor Mechanism:  Intact quadriceps tendon and patellar tendon.

Bones: Slight cystic degenerative changes associated with the tibial
spines.

Other: None
IMPRESSION: 1. Extensive chondromalacia of the apex and medial facets of the
patella with denuding of the articular cartilage at those sites.
2. Chondromalacia of the inner aspect of the medial tibial plateau.

## 2017-11-01 LAB — POCT I-STAT CREATININE
Creatinine, Ser: 0.7 mg/dL (ref 0.44–1.00)
Creatinine, Ser: 0.8 mg/dL (ref 0.44–1.00)

## 2017-11-02 ENCOUNTER — Encounter: Payer: Self-pay | Admitting: Hematology

## 2017-11-02 ENCOUNTER — Inpatient Hospital Stay: Payer: Medicare Other | Attending: Hematology | Admitting: Hematology

## 2017-11-02 VITALS — BP 184/77 | HR 82 | Temp 98.3°F | Resp 20 | Ht 63.0 in | Wt 132.6 lb

## 2017-11-02 DIAGNOSIS — C7A8 Other malignant neuroendocrine tumors: Secondary | ICD-10-CM | POA: Diagnosis present

## 2017-11-08 ENCOUNTER — Emergency Department (HOSPITAL_COMMUNITY)
Admission: EM | Admit: 2017-11-08 | Discharge: 2017-11-08 | Disposition: A | Discharge status: Home / Self Care | Payer: Medicare Other | Attending: Emergency Medicine | Admitting: Emergency Medicine

## 2017-11-08 ENCOUNTER — Emergency Department (HOSPITAL_COMMUNITY): Payer: Medicare Other

## 2017-11-08 ENCOUNTER — Other Ambulatory Visit: Payer: Self-pay

## 2017-11-08 ENCOUNTER — Encounter (HOSPITAL_COMMUNITY): Payer: Self-pay

## 2017-11-08 DIAGNOSIS — E119 Type 2 diabetes mellitus without complications: Secondary | ICD-10-CM | POA: Insufficient documentation

## 2017-11-08 DIAGNOSIS — M25562 Pain in left knee: Secondary | ICD-10-CM | POA: Diagnosis not present

## 2017-11-08 DIAGNOSIS — I10 Essential (primary) hypertension: Secondary | ICD-10-CM | POA: Diagnosis not present

## 2017-11-08 DIAGNOSIS — F17210 Nicotine dependence, cigarettes, uncomplicated: Secondary | ICD-10-CM | POA: Insufficient documentation

## 2017-11-08 DIAGNOSIS — Z7982 Long term (current) use of aspirin: Secondary | ICD-10-CM | POA: Diagnosis not present

## 2017-11-08 DIAGNOSIS — Z79899 Other long term (current) drug therapy: Secondary | ICD-10-CM | POA: Insufficient documentation

## 2017-11-08 MED ORDER — ACETAMINOPHEN 325 MG PO TABS
650.0000 mg | ORAL_TABLET | Freq: Once | ORAL | Status: AC
  Administered 2017-11-08: 650 mg via ORAL
  Filled 2017-11-08: qty 2

## 2017-11-08 NOTE — ED Notes (Signed)
PA in room to assess pt, and to order x-ray.

## 2017-11-08 NOTE — ED Notes (Signed)
Pt denies any recent injury to left knee, c/o pain and swelling in knee with pain radiating into hip and into ankle. Pt reprots using Lidocaine patch at home with no relief.
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
## 2017-11-08 NOTE — ED Triage Notes (Signed)
Pt endorses left knee pain radiating up to the mid thigh and down to the mid calf area, pain began this morning. Pedal pulse present. Worse with ambulation. CMS intact.

## 2017-11-08 NOTE — Discharge Instructions (Addendum)
Please read attached information. If you experience any new or worsening signs or symptoms please return to the emergency room for evaluation. Please follow-up with your primary care provider or specialist as discussed.  °

## 2017-11-10 ENCOUNTER — Ambulatory Visit (INDEPENDENT_AMBULATORY_CARE_PROVIDER_SITE_OTHER): Payer: Medicare Other | Admitting: Orthopaedic Surgery

## 2017-11-10 ENCOUNTER — Other Ambulatory Visit: Payer: Self-pay | Admitting: Gastroenterology

## 2017-11-10 ENCOUNTER — Encounter (INDEPENDENT_AMBULATORY_CARE_PROVIDER_SITE_OTHER): Payer: Self-pay | Admitting: Orthopaedic Surgery

## 2017-11-10 DIAGNOSIS — M1712 Unilateral primary osteoarthritis, left knee: Secondary | ICD-10-CM

## 2017-11-10 MED ORDER — METHYLPREDNISOLONE ACETATE 40 MG/ML IJ SUSP
40.0000 mg | INTRAMUSCULAR | Status: AC | PRN
  Administered 2017-11-10: 40 mg via INTRA_ARTICULAR

## 2017-11-10 MED ORDER — LIDOCAINE HCL 1 % IJ SOLN
3.0000 mL | INTRAMUSCULAR | Status: AC | PRN
  Administered 2017-11-10: 3 mL
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
## 2017-11-22 ENCOUNTER — Other Ambulatory Visit: Payer: Self-pay | Admitting: Gastroenterology

## 2017-11-24 ENCOUNTER — Ambulatory Visit (INDEPENDENT_AMBULATORY_CARE_PROVIDER_SITE_OTHER): Payer: Medicare Other | Admitting: Orthopaedic Surgery

## 2017-11-24 ENCOUNTER — Encounter (INDEPENDENT_AMBULATORY_CARE_PROVIDER_SITE_OTHER): Payer: Self-pay | Admitting: Orthopaedic Surgery

## 2017-11-24 ENCOUNTER — Telehealth (INDEPENDENT_AMBULATORY_CARE_PROVIDER_SITE_OTHER): Payer: Self-pay

## 2017-11-24 DIAGNOSIS — M1712 Unilateral primary osteoarthritis, left knee: Secondary | ICD-10-CM | POA: Diagnosis not present

## 2017-11-24 DIAGNOSIS — M1711 Unilateral primary osteoarthritis, right knee: Secondary | ICD-10-CM

## 2017-11-24 NOTE — Progress Notes (Signed)
The patient is a 77 year old female with bilateral knee pain.  She was sent to [place] from the emergency room 2 weeks ago with significant left knee pain.  Her x-rays show only mild arthritic changes with well-maintained joint space.  We were able to aspirate fluid from the knee and place a steroid injection in the knee.  It was wonderful for about 11 to 12 days now the pain is recurred.  She said really both knees hurt and she is having a lot of problems with mobility on her knees.  On exam neither knee has any swelling.  Both knees hurt at the posterior joint capsule with some patellofemoral crepitation on range of motion.  Again there is no effusion within the knee and her calves are soft.  Both knees are ligamentously stable.  At her last visit we did give her handout about hyaluronic acid to consider in the future for her knees.  She is interested in trying gel shots in both of her knees given the arthritic findings.  At this point we will order hyaluronic acid for both of her knees and places at the next visit.  This is to treat the pain from osteoarthritis in her knees.

## 2017-11-24 NOTE — Telephone Encounter (Signed)
Bilateral knee gel injections 

## 2017-11-26 ENCOUNTER — Ambulatory Visit: Payer: Medicaid Other | Admitting: Podiatry

## 2017-11-29 ENCOUNTER — Ambulatory Visit: Payer: Medicaid Other | Admitting: Podiatry

## 2017-11-29 NOTE — Telephone Encounter (Signed)
Noted  

## 2017-12-02 ENCOUNTER — Telehealth (INDEPENDENT_AMBULATORY_CARE_PROVIDER_SITE_OTHER): Payer: Self-pay

## 2017-12-02 NOTE — Telephone Encounter (Signed)
Submitted Museum/gallery cur[employer] for SynviscOne, bilateral knee due to insurance.

## 2017-12-06 ENCOUNTER — Telehealth: Payer: Self-pay | Admitting: Gastroenterology

## 2017-12-06 ENCOUNTER — Telehealth (INDEPENDENT_AMBULATORY_CARE_PROVIDER_SITE_OTHER): Payer: Self-pay

## 2017-12-06 MED ORDER — LIDOCAINE 5 % EX PTCH: 1.0000 | MEDICATED_PATCH | CUTANEOUS | 0 refills | Status: DC

## 2017-12-06 NOTE — Telephone Encounter (Signed)
The prescription was sent to the pharmacy as requested  

## 2017-12-06 NOTE — Telephone Encounter (Signed)
Patient is approved to have SynviscOne, bilateral knee. [place] No co-pay Patient will be responsible for 20% OOP. No PA required. Appt.scheduled 12/09/2017

## 2017-12-08 ENCOUNTER — Ambulatory Visit (INDEPENDENT_AMBULATORY_CARE_PROVIDER_SITE_OTHER): Payer: Medicare Other | Admitting: Physician Assistant

## 2017-12-09 ENCOUNTER — Encounter (INDEPENDENT_AMBULATORY_CARE_PROVIDER_SITE_OTHER): Payer: Self-pay | Admitting: Physician Assistant

## 2017-12-09 ENCOUNTER — Ambulatory Visit (INDEPENDENT_AMBULATORY_CARE_PROVIDER_SITE_OTHER): Payer: Medicare Other | Admitting: Physician Assistant

## 2017-12-09 DIAGNOSIS — M1711 Unilateral primary osteoarthritis, right knee: Secondary | ICD-10-CM | POA: Diagnosis not present

## 2017-12-09 DIAGNOSIS — M1712 Unilateral primary osteoarthritis, left knee: Secondary | ICD-10-CM

## 2017-12-09 MED ORDER — HYLAN G-F 20 48 MG/6ML IX SOSY
48.0000 mg | PREFILLED_SYRINGE | INTRA_ARTICULAR | Status: AC | PRN
  Administered 2017-12-09: 48 mg via INTRA_ARTICULAR

## 2017-12-09 MED ORDER — LIDOCAINE HCL 1 % IJ SOLN
3.0000 mL | INTRAMUSCULAR | Status: AC | PRN
  Administered 2017-12-09: 3 mL

## 2017-12-09 NOTE — Progress Notes (Signed)
   Procedure Note  Patient: Emily [name]             Date of Birth: 06/10/1940           MRN: [id number]             Visit Date: 12/09/2017 HPI: Mrs. Razzano returns today with bilateral knee pain.  She has mild arthritic changes on radiographs of her knees.  She underwent cortisone injections which had short-lived benefit.  She had no injury to either knee.  She is here today for supplemental injections in both knees.  Physical exam: Bilateral knees no effusion abnormal warmth erythema.  She has tenderness along medial joint line of the right knee and along the lateral joint line of the left knee.  No instability valgus varus stressing of either knee.   Procedures: Visit Diagnoses: Primary osteoarthritis of left knee - Plan: Large Joint Inj: bilateral knee  Unilateral primary osteoarthritis, right knee - Plan: Large Joint Inj: bilateral knee  Large Joint Inj: bilateral knee on 12/09/2017 3:18 PM Indications: pain Details: 22 G 1.5 in needle, anterolateral approach  Arthrogram: No  Medications (Right): 3 mL lidocaine 1 %; 48 mg Hylan 48 MG/6ML Medications (Left): 3 mL lidocaine 1 %; 48 mg Hylan 48 MG/6ML Outcome: tolerated well, no immediate complications Procedure, treatment alternatives, risks and benefits explained, specific risks discussed. Consent was given by the patient. Immediately prior to procedure a time out was called to verify the correct patient, procedure, equipment, support staff and site/side marked as required. Patient was prepped and draped in the usual sterile fashion.     Plan: She will follow-up with [place] on as-needed basis.  She understands she could have supplemental injections no more often than every 6 months.  Would not recommend repeat cortisone injection in the knee due to her poor response.  Questions encouraged and answered

## 2017-12-15 ENCOUNTER — Ambulatory Visit: Payer: Medicaid Other | Admitting: Podiatry

## 2017-12-20 ENCOUNTER — Telehealth (INDEPENDENT_AMBULATORY_CARE_PROVIDER_SITE_OTHER): Payer: Self-pay | Admitting: Physical Medicine and Rehabilitation

## 2017-12-20 NOTE — Telephone Encounter (Signed)
Of mostly low back pian and no new trauma etc and last injection helped then 30 minute repeat injection so I can do quick eval at same time

## 2017-12-21 NOTE — Telephone Encounter (Signed)
Left message

## 2017-12-21 NOTE — Telephone Encounter (Signed)
Scheduled for 01/04/18 at 1400.

## 2017-12-22 ENCOUNTER — Ambulatory Visit (INDEPENDENT_AMBULATORY_CARE_PROVIDER_SITE_OTHER): Payer: Medicare Other | Admitting: Podiatry

## 2017-12-22 ENCOUNTER — Other Ambulatory Visit: Payer: Self-pay | Admitting: Gastroenterology

## 2017-12-22 ENCOUNTER — Encounter: Payer: Self-pay | Admitting: Podiatry

## 2017-12-22 DIAGNOSIS — M79676 Pain in unspecified toe(s): Secondary | ICD-10-CM

## 2017-12-22 DIAGNOSIS — E0842 Diabetes mellitus due to underlying condition with diabetic polyneuropathy: Secondary | ICD-10-CM

## 2017-12-22 DIAGNOSIS — B351 Tinea unguium: Secondary | ICD-10-CM

## 2017-12-22 NOTE — Progress Notes (Signed)
Complaint:  Visit Type: Patient returns to my office for continued preventative foot care services. Complaint: Patient states" my nails have grown long and thick and become painful to walk and wear shoes" Patient has been diagnosed with DM with neuropathy.. The patient presents for preventative foot care services. No changes to ROS  Podiatric Exam: Vascular: dorsalis pedis and posterior tibial pulses are palpable bilateral. Capillary return is immediate. Temperature gradient is WNL. Skin turgor WNL  Sensorium: Diminished Semmes Weinstein monofilament test. Normal tactile sensation bilaterally. Nail Exam: Pt has thick disfigured discolored nails with subungual debris noted bilateral entire nail hallux through fifth toenails Ulcer Exam: There is no evidence of ulcer or pre-ulcerative changes or infection. Orthopedic Exam: Muscle tone and strength are WNL. No limitations in general ROM. No crepitus or effusions noted. Foot type and digits show no abnormalities. Bony prominences are unremarkable. Skin: No Porokeratosis. No infection or ulcers  Diagnosis:  Onychomycosis, , Pain in right toe, pain in left toes  Treatment & Plan Procedures and Treatment: Consent by patient was obtained for treatment procedures.   Debridement of mycotic and hypertrophic toenails, 1 through 5 bilateral and clearing of subungual debris. No ulceration, no infection noted.  Return Visit-Office Procedure: Patient instructed to return to the office for a follow up visit 3 months for continued evaluation and treatment.    Laree Garron DPM 

## 2018-01-04 ENCOUNTER — Encounter (INDEPENDENT_AMBULATORY_CARE_PROVIDER_SITE_OTHER): Payer: Medicare Other | Admitting: Physical Medicine and Rehabilitation

## 2018-01-07 ENCOUNTER — Telehealth (INDEPENDENT_AMBULATORY_CARE_PROVIDER_SITE_OTHER): Payer: Self-pay | Admitting: Radiology

## 2018-01-07 ENCOUNTER — Encounter (INDEPENDENT_AMBULATORY_CARE_PROVIDER_SITE_OTHER): Payer: Medicare Other | Admitting: Physical Medicine and Rehabilitation

## 2018-01-07 NOTE — Telephone Encounter (Signed)
Teacher, adult education called stated patients ride did not show up to bring patient to appointment today, 01/07/18.  Phone [phone] ext. [phone] if you have any questions.  Patient also called informing driver did not show. Requests appointment before or on 9/20, transportation has availability then. Please call patient at [phone] to reschedule.

## 2018-01-07 NOTE — Telephone Encounter (Signed)
Pt is r/s for 01/21/18

## 2018-01-11 IMAGING — CT CT ABD-PELV W/ CM
2 of 5 series · 11 of 46 positions shown, 12 images · IV contrast (agent unspecified)
Comparison: 07/23/2015 and prior CTs

CLINICAL DATA: 76-year-old female with right-sided abdominal and
pelvic pain with nausea.

EXAM:
CT ABDOMEN AND PELVIS WITH CONTRAST
TECHNIQUE: Multidetector CT imaging of the abdomen and pelvis was performed
using the standard protocol following bolus administration of
intravenous contrast.
CONTRAST:  80 cc intravenous Esovue-XHH

[Series 201: routine, idose (2) · axial · 0.74mm/px · z∈[-618,-298]mm · 8 of 84 slices shown, 9 images]
[im 10/84  soft-tissue]
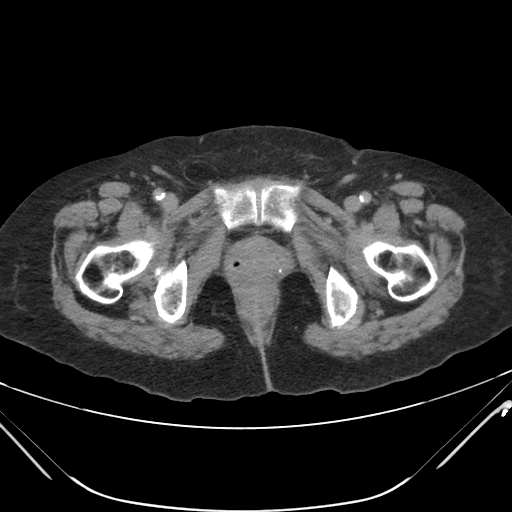
[im 10/84  bone]
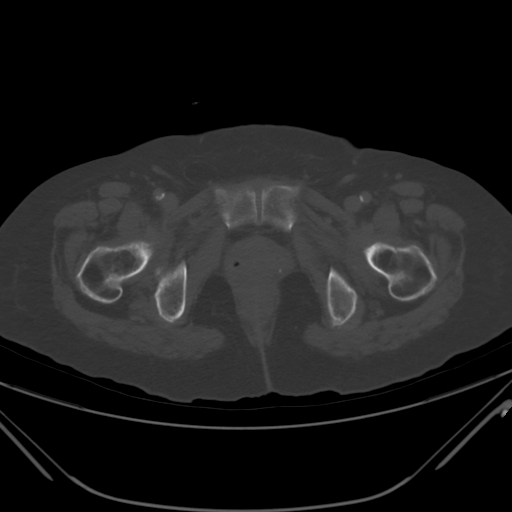
[im 19/84  soft-tissue]
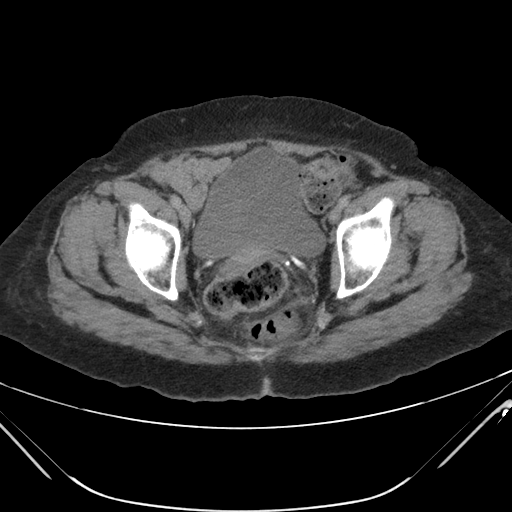
[im 28/84  soft-tissue]
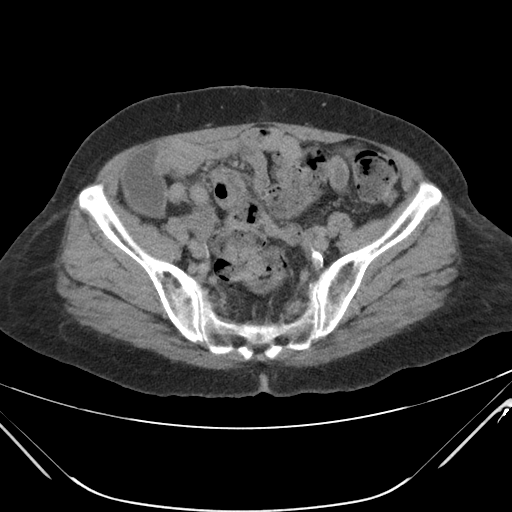
[im 37/84  soft-tissue]
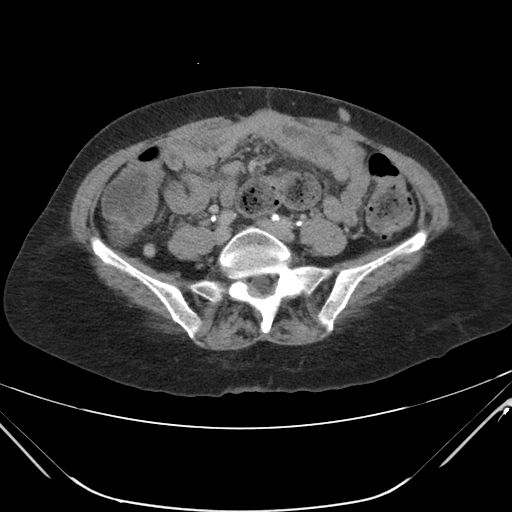
[im 47/84  soft-tissue]
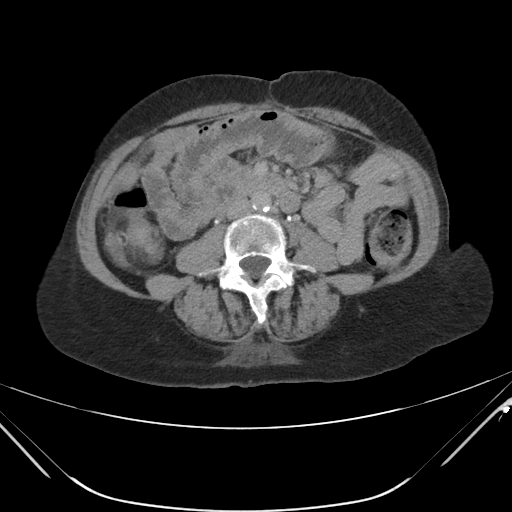
[im 56/84  soft-tissue]
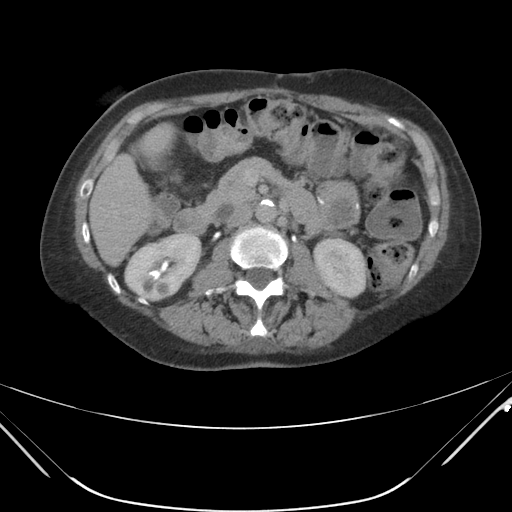
[im 65/84  soft-tissue]
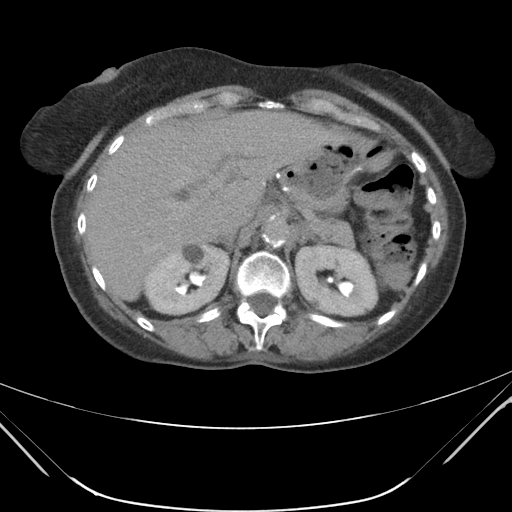
[im 74/84  soft-tissue]
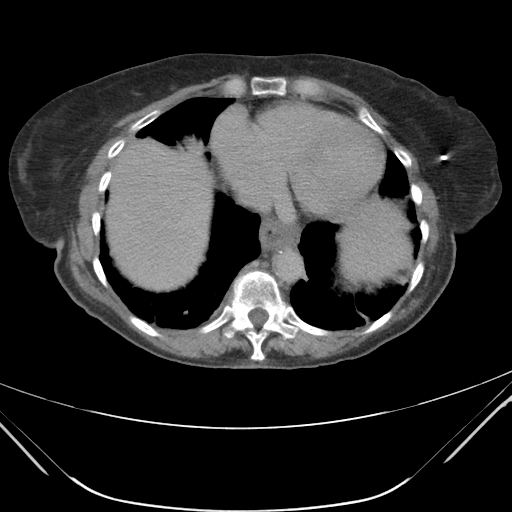

[Series 203: coronals, idose (2) · coronal · 0.45mm/px · 3 of 157 slices shown]
[im 53/157  soft-tissue]
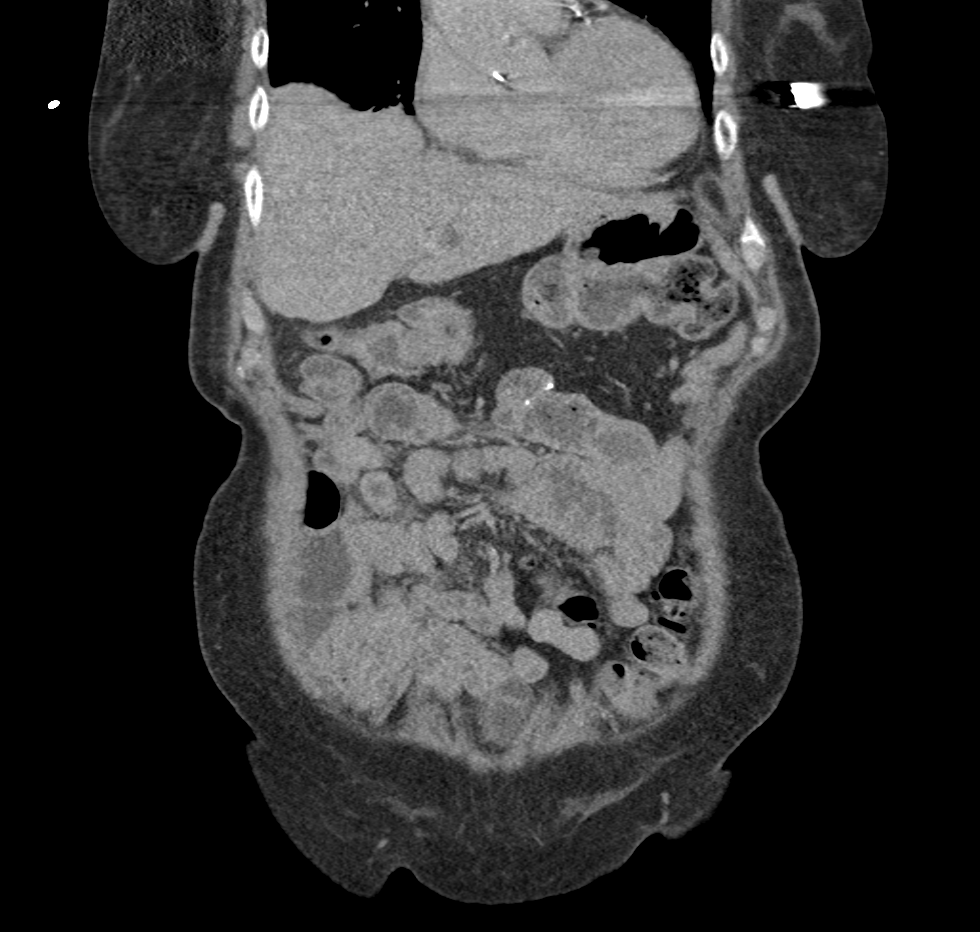
[im 70/157  soft-tissue]
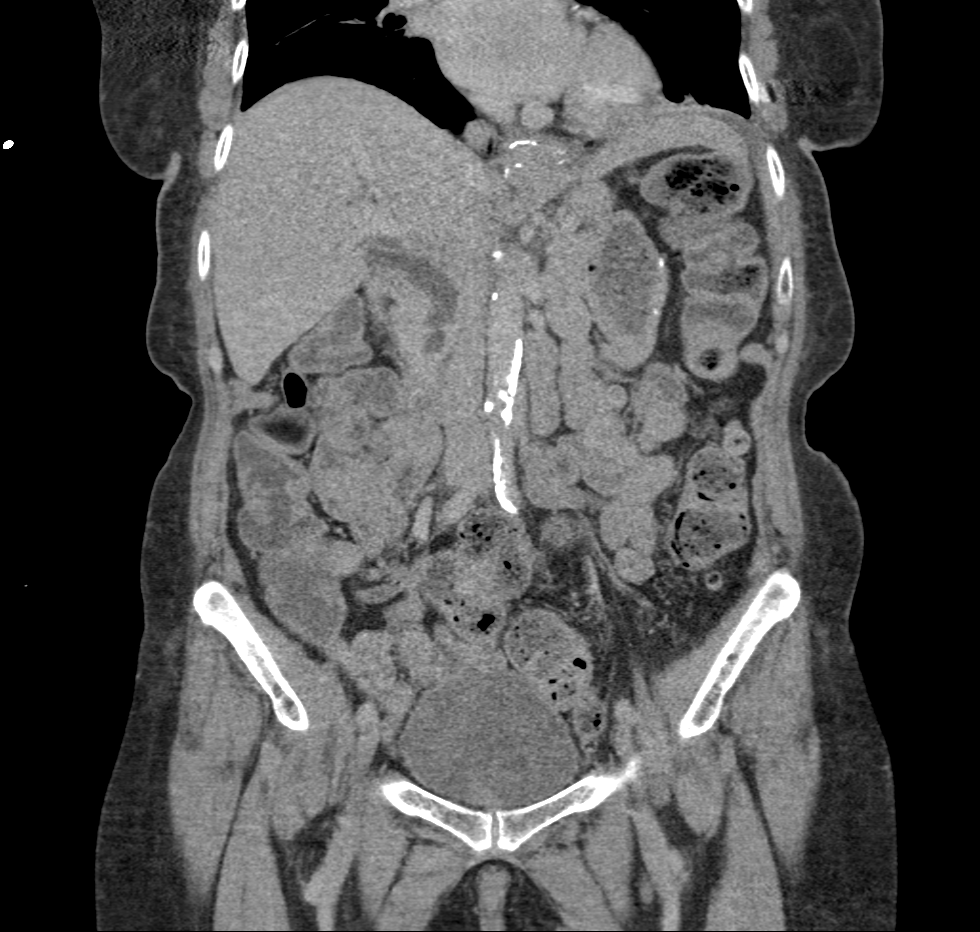
[im 87/157  soft-tissue]
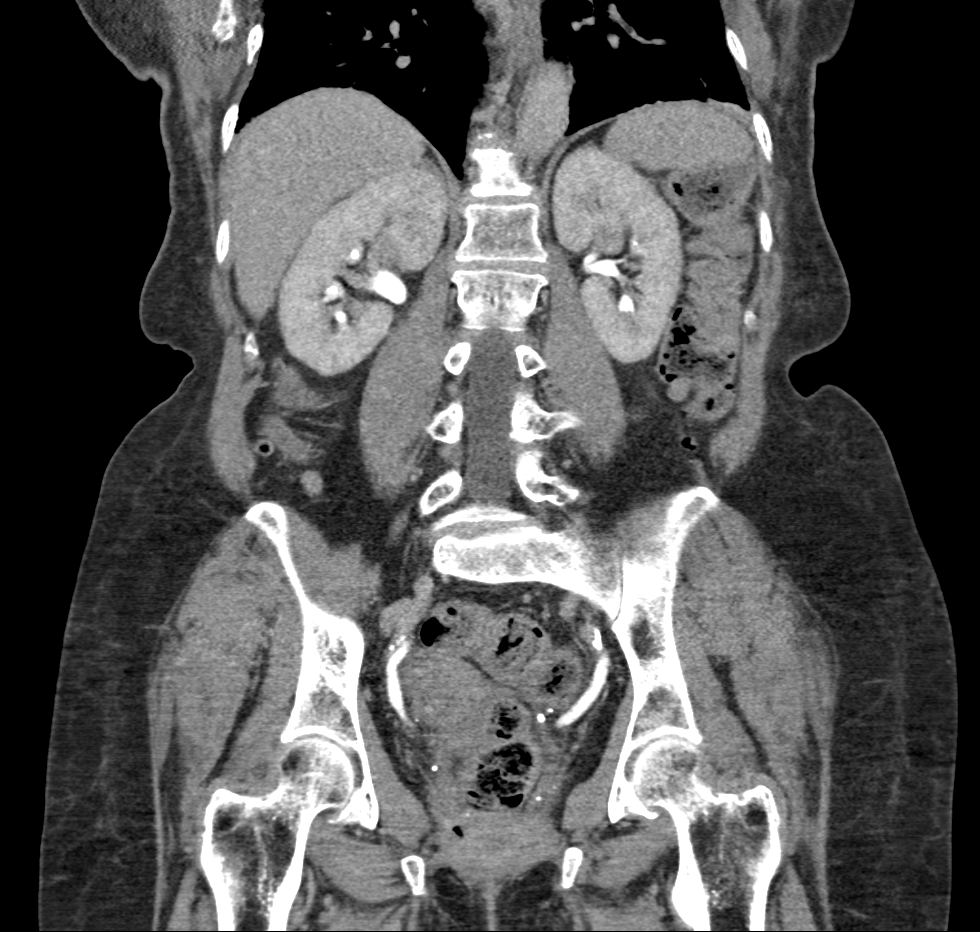

[11 of 46 positions shown; findings below may reference images not displayed]

FINDINGS: Lower chest: No acute abnormality.  Mild bibasilar scarring noted.

Hepatobiliary: The liver is unremarkable. The patient is status post
cholecystectomy. CBD dilatation is not significantly changed from
prior studies.

Pancreas: Unremarkable

Spleen: Unremarkable

Adrenals/Urinary Tract: Bilateral renal cysts are again noted. The
adrenal glands and bladder are unremarkable.

Stomach/Bowel: A distal appendicolith is now noted. Appendix is
upper limits of normal in size measuring 7 mm. Gastroenteric
surgical changes again noted. There is no evidence of bowel
obstruction for definite bowel wall thickening. Colonic
diverticulosis noted without diverticulitis.

Vascular/Lymphatic: Abdominal aortic atherosclerotic calcifications
noted without aneurysm. No enlarged lymph nodes identified.

Reproductive: Uterus and bilateral adnexa are unremarkable.

Other: No ascites, focal collection or pneumoperitoneum.

Musculoskeletal: No acute or suspicious bony abnormalities.
IMPRESSION: Distal appendicolith now noted with upper limits of normal caliber
appendix. Early or mild appendicitis is not entirely excluded.
Correlate with pain.

Abdominal aortic atherosclerosis.

Unchanged CBD dilatation.

## 2018-01-20 ENCOUNTER — Other Ambulatory Visit: Payer: Medicare Other

## 2018-01-20 ENCOUNTER — Ambulatory Visit: Payer: Medicare Other | Admitting: Hematology

## 2018-01-21 ENCOUNTER — Ambulatory Visit (INDEPENDENT_AMBULATORY_CARE_PROVIDER_SITE_OTHER): Payer: Medicare Other | Admitting: Physical Medicine and Rehabilitation

## 2018-01-21 ENCOUNTER — Other Ambulatory Visit: Payer: Self-pay | Admitting: Gastroenterology

## 2018-01-21 ENCOUNTER — Telehealth (INDEPENDENT_AMBULATORY_CARE_PROVIDER_SITE_OTHER): Payer: Self-pay | Admitting: *Deleted

## 2018-01-21 ENCOUNTER — Encounter (INDEPENDENT_AMBULATORY_CARE_PROVIDER_SITE_OTHER): Payer: Self-pay | Admitting: Physical Medicine and Rehabilitation

## 2018-01-21 ENCOUNTER — Ambulatory Visit (INDEPENDENT_AMBULATORY_CARE_PROVIDER_SITE_OTHER): Payer: Self-pay

## 2018-01-21 VITALS — BP 169/88 | HR 93 | Temp 99.0°F | Ht 63.0 in | Wt 136.0 lb

## 2018-01-21 DIAGNOSIS — M47816 Spondylosis without myelopathy or radiculopathy, lumbar region: Secondary | ICD-10-CM

## 2018-01-21 DIAGNOSIS — G8929 Other chronic pain: Secondary | ICD-10-CM

## 2018-01-21 DIAGNOSIS — M5416 Radiculopathy, lumbar region: Secondary | ICD-10-CM | POA: Diagnosis not present

## 2018-01-21 DIAGNOSIS — M545 Low back pain: Secondary | ICD-10-CM | POA: Diagnosis not present

## 2018-01-21 MED ORDER — BETAMETHASONE SOD PHOS & ACET 6 (3-3) MG/ML IJ SUSP
12.0000 mg | Freq: Once | INTRAMUSCULAR | Status: AC
  Administered 2018-01-21: 12 mg

## 2018-01-21 NOTE — Patient Instructions (Signed)

## 2018-01-21 NOTE — Telephone Encounter (Signed)
This [employer] members plan does not currently require a prior authorization for these services. Decision ID #:[id number]

## 2018-01-21 NOTE — Progress Notes (Signed)
 .  Numeric Pain Rating Scale and Functional Assessment Average Pain 7   In the last MONTH (on 0-10 scale) has pain interfered with the following?  1. General activity like being  able to carry out your everyday physical activities such as walking, climbing stairs, carrying groceries, or moving a chair?  Rating(6)   +Driver, -BT, -Dye Allergies.  

## 2018-01-24 ENCOUNTER — Other Ambulatory Visit: Payer: Self-pay | Admitting: Surgery

## 2018-01-24 DIAGNOSIS — R1013 Epigastric pain: Principal | ICD-10-CM

## 2018-01-24 DIAGNOSIS — G8929 Other chronic pain: Secondary | ICD-10-CM

## 2018-01-28 ENCOUNTER — Other Ambulatory Visit: Payer: Self-pay | Admitting: Gastroenterology

## 2018-02-03 NOTE — Procedures (Signed)
Lumbosacral Transforaminal Epidural Steroid Injection - Sub-Pedicular Approach with Fluoroscopic Guidance  Patient: Emily [name]      Date of Birth: 16-Dec-1940 MRN: [id number] PCP: Nolene Ebbs, MD      Visit Date: 01/21/2018   Universal Protocol:    Date/Time: 01/21/2018  Consent Given By: the patient  Position: PRONE  Additional Comments: Vital signs were monitored before and after the procedure. Patient was prepped and draped in the usual sterile fashion. The correct patient, procedure, and site was verified.   Injection Procedure Details:  Procedure Site One Meds Administered:  Meds ordered this encounter  Medications  . betamethasone acetate-betamethasone sodium phosphate (CELESTONE) injection 12 mg    Laterality: Bilateral  Location/Site:  L5-S1  Needle size: 22 G  Needle type: Spinal  Needle Placement: Transforaminal  Findings:    -Comments: Excellent flow of contrast along the nerve and into the epidural space.  Procedure Details: After squaring off the end-plates to get a true AP view, the C-arm was positioned so that an oblique view of the foramen as noted above was visualized. The target area is just inferior to the "nose of the scotty dog" or sub pedicular. The soft tissues overlying this structure were infiltrated with 2-3 ml. of 1% Lidocaine without Epinephrine.  The spinal needle was inserted toward the target using a "trajectory" view along the fluoroscope beam.  Under AP and lateral visualization, the needle was advanced so it did not puncture dura and was located close the 6 O'Clock position of the pedical in AP tracterory. Biplanar projections were used to confirm position. Aspiration was confirmed to be negative for CSF and/or blood. A 1-2 ml. volume of Isovue-250 was injected and flow of contrast was noted at each level. Radiographs were obtained for documentation purposes.   After attaining the desired flow of contrast documented above, a 0.5  to 1.0 ml test dose of 0.25% Marcaine was injected into each respective transforaminal space.  The patient was observed for 90 seconds post injection.  After no sensory deficits were reported, and normal lower extremity motor function was noted,   the above injectate was administered so that equal amounts of the injectate were placed at each foramen (level) into the transforaminal epidural space.   Additional Comments:  The patient tolerated the procedure well Dressing: Band-Aid    Post-procedure details: Patient was observed during the procedure. Post-procedure instructions were reviewed.  Patient left the clinic in stable condition.

## 2018-02-03 NOTE — Progress Notes (Signed)
Emily [name] - 77 y.o. female MRN [id number]  Date of birth: May 24, 1940  Office Visit Note: Visit Date: 01/21/2018 PCP: Nolene Ebbs, MD Referred by: Nolene Ebbs, MD  Subjective: Chief Complaint  Patient presents with  . Lower Back - Pain  . Right Leg - Pain  . Left Leg - Pain  . Left Knee - Pain  . Right Knee - Pain   HPI:  Emily [name] Is a 77 year old female who comes in today at the request of Benita Stabile, P.A.-C for possible interventional spine procedure.  Patient has been having chronic worsening severe low back pain with bilateral radicular type hip and leg pain.  She reports pain that refers down both legs and pain into both knees.  This started back in July 2019.  She is on her feet more than 10 minutes it makes the pain worse.  MRI from 2017 did not show any central stenosis.  She did have bilateral facet arthropathy at L5-S1 with some lateral recess narrowing bilaterally.  This could cause L5 radicular type complaints.  We are going to complete diagnostic note for therapeutic bilateral L5 transforaminal injection.  We will see her back in the future for possible facet joint block for more of her back pain.  She will continue to follow-up with Benita Stabile.    ROS Otherwise per HPI.  Assessment & Plan: Visit Diagnoses:  1. Spondylosis without myelopathy or radiculopathy, lumbar region   2. Lumbar radiculopathy   3. Chronic bilateral low back pain without sciatica     Plan: No additional findings.   Meds & Orders:  Meds ordered this encounter  Medications  . betamethasone acetate-betamethasone sodium phosphate (CELESTONE) injection 12 mg    Orders Placed This Encounter  Procedures  . XR C-ARM NO REPORT  . Epidural Steroid injection    Follow-up: Return if symptoms worsen or fail to improve.   Procedures: No procedures performed  Lumbosacral Transforaminal Epidural Steroid Injection - Sub-Pedicular Approach with Fluoroscopic Guidance  Patient: Emily [name]      Date of Birth: 1940-10-06 MRN: [id number] PCP: Nolene Ebbs, MD      Visit Date: 01/21/2018   Universal Protocol:    Date/Time: 01/21/2018  Consent Given By: the patient  Position: PRONE  Additional Comments: Vital signs were monitored before and after the procedure. Patient was prepped and draped in the usual sterile fashion. The correct patient, procedure, and site was verified.   Injection Procedure Details:  Procedure Site One Meds Administered:  Meds ordered this encounter  Medications  . betamethasone acetate-betamethasone sodium phosphate (CELESTONE) injection 12 mg    Laterality: Bilateral  Location/Site:  L5-S1  Needle size: 22 G  Needle type: Spinal  Needle Placement: Transforaminal  Findings:    -Comments: Excellent flow of contrast along the nerve and into the epidural space.  Procedure Details: After squaring off the end-plates to get a true AP view, the C-arm was positioned so that an oblique view of the foramen as noted above was visualized. The target area is just inferior to the "nose of the scotty dog" or sub pedicular. The soft tissues overlying this structure were infiltrated with 2-3 ml. of 1% Lidocaine without Epinephrine.  The spinal needle was inserted toward the target using a "trajectory" view along the fluoroscope beam.  Under AP and lateral visualization, the needle was advanced so it did not puncture dura and was located close the 6 O'Clock position of the pedical in AP tracterory. Biplanar projections were used  to confirm position. Aspiration was confirmed to be negative for CSF and/or blood. A 1-2 ml. volume of Isovue-250 was injected and flow of contrast was noted at each level. Radiographs were obtained for documentation purposes.   After attaining the desired flow of contrast documented above, a 0.5 to 1.0 ml test dose of 0.25% Marcaine was injected into each respective transforaminal space.  The patient was observed for 90  seconds post injection.  After no sensory deficits were reported, and normal lower extremity motor function was noted,   the above injectate was administered so that equal amounts of the injectate were placed at each foramen (level) into the transforaminal epidural space.   Additional Comments:  The patient tolerated the procedure well Dressing: Band-Aid    Post-procedure details: Patient was observed during the procedure. Post-procedure instructions were reviewed.  Patient left the clinic in stable condition.    Clinical History: IMPRESSION: 1. At L5-S1 there is a broad-based disc bulge with bilateral facet arthropathy and right lateral recess stenosis. 2. At L1-2 there is a broad-based disc bulge eccentric towards the right. Mild bilateral facet arthropathy.   Electronically Signed   By: Kathreen Devoid   On: 03/24/2016 16:05     Objective:  VS:  HT:5\' 3"  (160 cm)   WT:136 lb (61.7 kg)  BMI:24.1    BP:(!) 169/88  HR:93bpm  TEMP:99 F (37.2 C)(Oral)  RESP:  Physical Exam  Ortho Exam Imaging: No results found.

## 2018-02-04 ENCOUNTER — Ambulatory Visit
Admission: RE | Admit: 2018-02-04 | Discharge: 2018-02-04 | Disposition: A | Discharge status: Home / Self Care | Payer: Medicare Other | Source: Ambulatory Visit | Attending: Surgery | Admitting: Surgery

## 2018-02-04 DIAGNOSIS — G8929 Other chronic pain: Secondary | ICD-10-CM

## 2018-02-04 DIAGNOSIS — R1013 Epigastric pain: Principal | ICD-10-CM

## 2018-02-20 ENCOUNTER — Other Ambulatory Visit: Payer: Self-pay | Admitting: Gastroenterology
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
## 2018-03-02 ENCOUNTER — Inpatient Hospital Stay: Payer: Medicare Other

## 2018-03-02 ENCOUNTER — Encounter: Payer: Self-pay | Admitting: Hematology

## 2018-03-02 ENCOUNTER — Inpatient Hospital Stay: Payer: Medicare Other | Attending: Hematology | Admitting: Hematology

## 2018-03-02 ENCOUNTER — Telehealth: Payer: Self-pay

## 2018-03-02 VITALS — BP 143/66 | HR 80 | Temp 98.6°F | Resp 17 | Ht 63.0 in | Wt 137.0 lb

## 2018-03-02 DIAGNOSIS — F17210 Nicotine dependence, cigarettes, uncomplicated: Secondary | ICD-10-CM

## 2018-03-02 DIAGNOSIS — D649 Anemia, unspecified: Secondary | ICD-10-CM | POA: Insufficient documentation

## 2018-03-02 DIAGNOSIS — C7A8 Other malignant neuroendocrine tumors: Secondary | ICD-10-CM | POA: Diagnosis present

## 2018-03-02 DIAGNOSIS — R109 Unspecified abdominal pain: Secondary | ICD-10-CM | POA: Diagnosis not present

## 2018-03-02 LAB — CBC WITH DIFFERENTIAL/PLATELET
Abs Immature Granulocytes: 0.01 10*3/uL (ref 0.00–0.07)
Basophils Absolute: 0 10*3/uL (ref 0.0–0.1)
Basophils Relative: 0 %
Eosinophils Absolute: 0 10*3/uL (ref 0.0–0.5)
Eosinophils Relative: 0 %
HCT: 34.8 % — ABNORMAL LOW (ref 36.0–46.0)
Hemoglobin: 10.8 g/dL — ABNORMAL LOW (ref 12.0–15.0)
Immature Granulocytes: 0 %
Lymphocytes Relative: 25 %
Lymphs Abs: 1.9 10*3/uL (ref 0.7–4.0)
MCH: 24.7 pg — ABNORMAL LOW (ref 26.0–34.0)
MCHC: 31 g/dL (ref 30.0–36.0)
MCV: 79.6 fL — ABNORMAL LOW (ref 80.0–100.0)
Monocytes Absolute: 0.5 10*3/uL (ref 0.1–1.0)
Monocytes Relative: 7 %
Neutro Abs: 4.9 10*3/uL (ref 1.7–7.7)
Neutrophils Relative %: 68 %
Platelets: 214 10*3/uL (ref 150–400)
RBC: 4.37 MIL/uL (ref 3.87–5.11)
RDW: 15.4 % (ref 11.5–15.5)
WBC: 7.4 10*3/uL (ref 4.0–10.5)
nRBC: 0 % (ref 0.0–0.2)

## 2018-03-02 LAB — COMPREHENSIVE METABOLIC PANEL
ALT: 26 U/L (ref 0–44)
AST: 33 U/L (ref 15–41)
Albumin: 3.8 g/dL (ref 3.5–5.0)
Alkaline Phosphatase: 87 U/L (ref 38–126)
Anion gap: 6 (ref 5–15)
BUN: 14 mg/dL (ref 8–23)
CO2: 24 mmol/L (ref 22–32)
Calcium: 8.7 mg/dL — ABNORMAL LOW (ref 8.9–10.3)
Chloride: 111 mmol/L (ref 98–111)
Creatinine, Ser: 0.81 mg/dL (ref 0.44–1.00)
GFR calc Af Amer: >60 mL/min (ref >60)
GFR calc non Af Amer: >60 mL/min (ref >60)
Glucose, Bld: 90 mg/dL (ref 70–99)
Potassium: 4.2 mmol/L (ref 3.5–5.1)
Sodium: 141 mmol/L (ref 135–145)
Total Bilirubin: 0.3 mg/dL (ref 0.3–1.2)
Total Protein: 7 g/dL (ref 6.5–8.1)

## 2018-03-02 NOTE — Telephone Encounter (Signed)
Printed avs and calender of upcoming appointments. Per 10/30 los gave number, and information for ct scan with contrast

## 2018-03-03 LAB — IRON AND TIBC
Iron: 141 ug/dL (ref 41–142)
Saturation Ratios: 40 % (ref 21–57)
TIBC: 348 ug/dL (ref 236–444)
UIBC: 207 ug/dL (ref 120–384)

## 2018-03-03 LAB — FERRITIN: Ferritin: 33 ng/mL (ref 11–307)

## 2018-03-04 ENCOUNTER — Inpatient Hospital Stay: Payer: Medicare Other | Attending: Hematology

## 2018-03-04 ENCOUNTER — Other Ambulatory Visit: Payer: Self-pay

## 2018-03-04 DIAGNOSIS — D649 Anemia, unspecified: Secondary | ICD-10-CM | POA: Insufficient documentation

## 2018-03-04 DIAGNOSIS — R109 Unspecified abdominal pain: Secondary | ICD-10-CM | POA: Insufficient documentation

## 2018-03-04 DIAGNOSIS — F17210 Nicotine dependence, cigarettes, uncomplicated: Secondary | ICD-10-CM | POA: Insufficient documentation

## 2018-03-04 DIAGNOSIS — C7A8 Other malignant neuroendocrine tumors: Secondary | ICD-10-CM | POA: Insufficient documentation

## 2018-03-04 LAB — CHROMOGRANIN A: Chromogranin A: 1 nmol/L (ref 0–5)

## 2018-03-05 ENCOUNTER — Other Ambulatory Visit: Payer: Self-pay | Admitting: Hematology

## 2018-03-05 DIAGNOSIS — D649 Anemia, unspecified: Secondary | ICD-10-CM

## 2018-03-07 ENCOUNTER — Telehealth: Payer: Self-pay

## 2018-03-07 ENCOUNTER — Other Ambulatory Visit: Payer: Self-pay | Admitting: Gastroenterology

## 2018-03-07 NOTE — Telephone Encounter (Signed)
-----   Message from Truitt Merle, MD sent at 03/05/2018  3:37 PM EDT ----- Please let pt know her iron study was WNL but on low end, I recommend her to start oral ferrous sulfate or other OTC iron pill, once daily, watch for constipation, and please set up repeating lab in in 6-8 weeks, thanks   Truitt Merle  03/05/2018

## 2018-03-07 NOTE — Telephone Encounter (Signed)
Spoke to patient per Dr. Burr Medico let her know iron study was WNL however on the low end, instructed her to take oral iron supplement daily, watch for constipation and we will repeat lab work in 6 to 8 weeks, patient verbalized an understanding.

## 2018-03-08 ENCOUNTER — Telehealth: Payer: Self-pay | Admitting: Hematology

## 2018-03-08 NOTE — Telephone Encounter (Signed)
Scheduled appt per 11/4 sch message - pt is aware of appt date and time   

## 2018-03-09 LAB — 5 HIAA, QUANTITATIVE, URINE, 24 HOUR
5-HIAA, Ur: 2.3 mg/L
5-HIAA,Quant.,24 Hr Urine: 2.3 mg/24 hr (ref 0.0–14.9)
Total Volume: 1000

## 2018-03-15 ENCOUNTER — Telehealth: Payer: Self-pay

## 2018-03-15 NOTE — Telephone Encounter (Signed)
Spoke with patient per Dr. Burr Medico with lab results.  Informed her blood and urine tumor markers are all normal, no concerns.  Patient verbalized an understanding.

## 2018-03-15 NOTE — Telephone Encounter (Signed)
-----   Message from Truitt Merle, MD sent at 03/12/2018 10:25 PM EST ----- Please let pt know her blood and urine tumor markers are all normal, no concerns, thanks  Truitt Merle  03/12/2018

## 2018-03-17 ENCOUNTER — Telehealth: Payer: Self-pay | Admitting: Gastroenterology

## 2018-03-17 ENCOUNTER — Encounter: Payer: Self-pay | Admitting: Gastroenterology

## 2018-03-17 ENCOUNTER — Ambulatory Visit (INDEPENDENT_AMBULATORY_CARE_PROVIDER_SITE_OTHER): Payer: Medicare Other | Admitting: Gastroenterology

## 2018-03-17 VITALS — BP 114/60 | HR 80 | Ht 61.8 in | Wt 136.5 lb

## 2018-03-17 DIAGNOSIS — R110 Nausea: Secondary | ICD-10-CM

## 2018-03-17 DIAGNOSIS — Z980 Intestinal bypass and anastomosis status: Secondary | ICD-10-CM

## 2018-03-17 DIAGNOSIS — K5900 Constipation, unspecified: Secondary | ICD-10-CM

## 2018-03-17 DIAGNOSIS — K219 Gastro-esophageal reflux disease without esophagitis: Secondary | ICD-10-CM | POA: Diagnosis not present

## 2018-03-17 DIAGNOSIS — R109 Unspecified abdominal pain: Secondary | ICD-10-CM | POA: Diagnosis not present

## 2018-03-17 MED ORDER — MAGIC MOUTHWASH W/LIDOCAINE: 5.0000 mL | Freq: Four times a day (QID) | ORAL | 11 refills | Status: DC | PRN

## 2018-03-17 NOTE — Telephone Encounter (Signed)
Pt called to inform that her insurance does not cover medication that she was just prescribed. She wants to know if she can have something similar.

## 2018-03-17 NOTE — Patient Instructions (Addendum)
We have sent Magic mouthwash to your pharmacy  Follow up in 1 year    Gastroesophageal Reflux Disease, Adult Normally, food travels down the esophagus and stays in the stomach to be digested. However, when a person has gastroesophageal reflux disease (GERD), food and stomach acid move back up into the esophagus. When this happens, the esophagus becomes sore and inflamed. Over time, GERD can create small holes (ulcers) in the lining of the esophagus. What are the causes? This condition is caused by a problem with the muscle between the esophagus and the stomach (lower esophageal sphincter, or LES). Normally, the LES muscle closes after food passes through the esophagus to the stomach. When the LES is weakened or abnormal, it does not close properly, and that allows food and stomach acid to go back up into the esophagus. The LES can be weakened by certain dietary substances, medicines, and medical conditions, including:  Tobacco use.  Pregnancy.  Having a hiatal hernia.  Heavy alcohol use.  Certain foods and beverages, such as coffee, chocolate, onions, and peppermint.  What increases the risk? This condition is more likely to develop in:  People who have an increased body weight.  People who have connective tissue disorders.  People who use NSAID medicines.  What are the signs or symptoms? Symptoms of this condition include:  Heartburn.  Difficult or painful swallowing.  The feeling of having a lump in the throat.  Abitter taste in the mouth.  Bad breath.  Having a large amount of saliva.  Having an upset or bloated stomach.  Belching.  Chest pain.  Shortness of breath or wheezing.  Ongoing (chronic) cough or a night-time cough.  Wearing away of tooth enamel.  Weight loss.  Different conditions can cause chest pain. Make sure to see your health care provider if you experience chest pain. How is this diagnosed? Your health care provider will take a medical  history and perform a physical exam. To determine if you have mild or severe GERD, your health care provider may also monitor how you respond to treatment. You may also have other tests, including:  An endoscopy toexamine your stomach and esophagus with a small camera.  A test thatmeasures the acidity level in your esophagus.  A test thatmeasures how much pressure is on your esophagus.  A barium swallow or modified barium swallow to show the shape, size, and functioning of your esophagus.  How is this treated? The goal of treatment is to help relieve your symptoms and to prevent complications. Treatment for this condition may vary depending on how severe your symptoms are. Your health care provider may recommend:  Changes to your diet.  Medicine.  Surgery.  Follow these instructions at home: Diet  Follow a diet as recommended by your health care provider. This may involve avoiding foods and drinks such as: ? Coffee and tea (with or without caffeine). ? Drinks that containalcohol. ? Energy drinks and sports drinks. ? Carbonated drinks or sodas. ? Chocolate and cocoa. ? Peppermint and mint flavorings. ? Garlic and onions. ? Horseradish. ? Spicy and acidic foods, including peppers, chili powder, curry powder, vinegar, hot sauces, and barbecue sauce. ? Citrus fruit juices and citrus fruits, such as oranges, lemons, and limes. ? Tomato-based foods, such as red sauce, chili, salsa, and pizza with red sauce. ? Fried and fatty foods, such as donuts, french fries, potato chips, and high-fat dressings. ? High-fat meats, such as hot dogs and fatty cuts of red and white meats,  such as rib eye steak, sausage, ham, and bacon. ? High-fat dairy items, such as whole milk, butter, and cream cheese.  Eat small, frequent meals instead of large meals.  Avoid drinking large amounts of liquid with your meals.  Avoid eating meals during the 2-3 hours before bedtime.  Avoid lying down right  after you eat.  Do not exercise right after you eat. General instructions  Pay attention to any changes in your symptoms.  Take over-the-counter and prescription medicines only as told by your health care provider. Do not take aspirin, ibuprofen, or other NSAIDs unless your health care provider told you to do so.  Do not use any tobacco products, including cigarettes, chewing tobacco, and e-cigarettes. If you need help quitting, ask your health care provider.  Wear loose-fitting clothing. Do not wear anything tight around your waist that causes pressure on your abdomen.  Raise (elevate) the head of your bed 6 inches (15cm).  Try to reduce your stress, such as with yoga or meditation. If you need help reducing stress, ask your health care provider.  If you are overweight, reduce your weight to an amount that is healthy for you. Ask your health care provider for guidance about a safe weight loss goal.  Keep all follow-up visits as told by your health care provider. This is important. Contact a health care provider if:  You have new symptoms.  You have unexplained weight loss.  You have difficulty swallowing, or it hurts to swallow.  You have wheezing or a persistent cough.  Your symptoms do not improve with treatment.  You have a hoarse voice. Get help right away if:  You have pain in your arms, neck, jaw, teeth, or back.  You feel sweaty, dizzy, or light-headed.  You have chest pain or shortness of breath.  You vomit and your vomit looks like blood or coffee grounds.  You faint.  Your stool is bloody or black.  You cannot swallow, drink, or eat. This information is not intended to replace advice given to you by your health care provider. Make sure you discuss any questions you have with your health care provider. Document Released: 01/28/2005 Document Revised: 09/18/2015 Document Reviewed: 08/15/2014 Elsevier Interactive Patient Education  2018 Kennedy you for choosing [facility]  Kavitha Nandigam,MD

## 2018-03-17 NOTE — Telephone Encounter (Signed)
Dr Nandigam Please advise  
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
## 2018-03-18 NOTE — Telephone Encounter (Signed)
Called [facility] and patient picked up prescription for magic Mouth Wash it was $63

## 2018-03-18 NOTE — Telephone Encounter (Signed)
Please send Rx for Maalox and Viscous lidocaine 2%. Take 5cc as needed PRN. Avoid eating or drinking anything for 1-2 hours after the medication to prevent aspiration. Thanks

## 2018-03-22 ENCOUNTER — Other Ambulatory Visit: Payer: Self-pay | Admitting: Gastroenterology

## 2018-03-23 ENCOUNTER — Encounter: Payer: Self-pay | Admitting: Podiatry

## 2018-03-23 ENCOUNTER — Ambulatory Visit (INDEPENDENT_AMBULATORY_CARE_PROVIDER_SITE_OTHER): Payer: Medicare Other | Admitting: Podiatry

## 2018-03-23 DIAGNOSIS — E0842 Diabetes mellitus due to underlying condition with diabetic polyneuropathy: Secondary | ICD-10-CM

## 2018-03-23 DIAGNOSIS — M79676 Pain in unspecified toe(s): Secondary | ICD-10-CM

## 2018-03-23 DIAGNOSIS — B351 Tinea unguium: Secondary | ICD-10-CM

## 2018-03-23 NOTE — Progress Notes (Signed)
Complaint:  Visit Type: Patient returns to my office for continued preventative foot care services. Complaint: Patient states" my nails have grown long and thick and become painful to walk and wear shoes" Patient has been diagnosed with DM with neuropathy.. The patient presents for preventative foot care services. No changes to ROS  Podiatric Exam: Vascular: dorsalis pedis and posterior tibial pulses are palpable bilateral. Capillary return is immediate. Temperature gradient is WNL. Skin turgor WNL  Sensorium: Diminished  Semmes Weinstein monofilament test. Normal tactile sensation bilaterally. Nail Exam: Pt has thick disfigured discolored nails with subungual debris noted bilateral entire nail hallux through fifth toenails Ulcer Exam: There is no evidence of ulcer or pre-ulcerative changes or infection. Orthopedic Exam: Muscle tone and strength are WNL. No limitations in general ROM. No crepitus or effusions noted. Foot type and digits show no abnormalities. Bony prominences are unremarkable. Skin: No Porokeratosis. No infection or ulcers  Diagnosis:  Onychomycosis, , Pain in right toe, pain in left toes  Treatment & Plan Procedures and Treatment: Consent by patient was obtained for treatment procedures.   Debridement of mycotic and hypertrophic toenails, 1 through 5 bilateral and clearing of subungual debris. No ulceration, no infection noted.  Return Visit-Office Procedure: Patient instructed to return to the office for a follow up visit 10 weeks  for continued evaluation and treatment.    Khori Underberg DPM 

## 2018-04-05 ENCOUNTER — Other Ambulatory Visit: Payer: Self-pay | Admitting: Gastroenterology

## 2018-04-07 ENCOUNTER — Ambulatory Visit (INDEPENDENT_AMBULATORY_CARE_PROVIDER_SITE_OTHER): Payer: Medicare Other | Admitting: Physician Assistant

## 2018-04-11 ENCOUNTER — Ambulatory Visit (INDEPENDENT_AMBULATORY_CARE_PROVIDER_SITE_OTHER): Payer: Medicare Other | Admitting: Physician Assistant

## 2018-04-11 ENCOUNTER — Other Ambulatory Visit: Payer: Self-pay | Admitting: Gastroenterology

## 2018-04-11 ENCOUNTER — Ambulatory Visit (INDEPENDENT_AMBULATORY_CARE_PROVIDER_SITE_OTHER): Payer: Self-pay

## 2018-04-11 ENCOUNTER — Encounter (INDEPENDENT_AMBULATORY_CARE_PROVIDER_SITE_OTHER): Payer: Self-pay | Admitting: Physician Assistant

## 2018-04-11 DIAGNOSIS — M25561 Pain in right knee: Secondary | ICD-10-CM | POA: Diagnosis not present

## 2018-04-11 DIAGNOSIS — M25562 Pain in left knee: Secondary | ICD-10-CM | POA: Diagnosis not present

## 2018-04-11 DIAGNOSIS — G8929 Other chronic pain: Secondary | ICD-10-CM

## 2018-04-11 MED ORDER — METHYLPREDNISOLONE ACETATE 40 MG/ML IJ SUSP
40.0000 mg | INTRAMUSCULAR | Status: AC | PRN
  Administered 2018-04-11: 40 mg via INTRA_ARTICULAR

## 2018-04-11 MED ORDER — LIDOCAINE HCL 1 % IJ SOLN
0.5000 mL | INTRAMUSCULAR | Status: AC | PRN
  Administered 2018-04-11: .5 mL

## 2018-04-11 NOTE — Progress Notes (Signed)
kne
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
## 2018-04-12 ENCOUNTER — Encounter (HOSPITAL_COMMUNITY): Payer: Medicare Other

## 2018-04-13 ENCOUNTER — Encounter (HOSPITAL_COMMUNITY): Payer: Medicare Other

## 2018-04-15 ENCOUNTER — Ambulatory Visit (HOSPITAL_COMMUNITY)
Admission: RE | Admit: 2018-04-15 | Discharge: 2018-04-15 | Disposition: A | Discharge status: Home / Self Care | Payer: Medicare Other | Source: Ambulatory Visit | Attending: Physician Assistant | Admitting: Physician Assistant

## 2018-04-15 DIAGNOSIS — M25562 Pain in left knee: Secondary | ICD-10-CM | POA: Insufficient documentation

## 2018-04-15 DIAGNOSIS — G8929 Other chronic pain: Secondary | ICD-10-CM | POA: Insufficient documentation

## 2018-04-15 DIAGNOSIS — M25561 Pain in right knee: Secondary | ICD-10-CM | POA: Diagnosis present

## 2018-04-15 NOTE — Progress Notes (Signed)
LE venous duplex       has been completed. Preliminary results can be found under CV proc through chart review. Landry Mellow, RDMS, RVT    Attempted call report to Dr. Carlis Abbott with no answer. Will let patient leave.

## 2018-04-18 ENCOUNTER — Inpatient Hospital Stay: Payer: Medicare Other | Attending: Hematology

## 2018-04-18 DIAGNOSIS — R109 Unspecified abdominal pain: Secondary | ICD-10-CM | POA: Diagnosis not present

## 2018-04-18 DIAGNOSIS — D649 Anemia, unspecified: Secondary | ICD-10-CM | POA: Diagnosis not present

## 2018-04-18 DIAGNOSIS — F17210 Nicotine dependence, cigarettes, uncomplicated: Secondary | ICD-10-CM | POA: Insufficient documentation

## 2018-04-18 DIAGNOSIS — C7A8 Other malignant neuroendocrine tumors: Secondary | ICD-10-CM | POA: Insufficient documentation

## 2018-04-18 LAB — CBC WITH DIFFERENTIAL/PLATELET
Abs Immature Granulocytes: 0.03 10*3/uL (ref 0.00–0.07)
Basophils Absolute: 0 10*3/uL (ref 0.0–0.1)
Basophils Relative: 0 %
Eosinophils Absolute: 0 10*3/uL (ref 0.0–0.5)
Eosinophils Relative: 0 %
HCT: 33.9 % — ABNORMAL LOW (ref 36.0–46.0)
Hemoglobin: 10.6 g/dL — ABNORMAL LOW (ref 12.0–15.0)
Immature Granulocytes: 0 %
Lymphocytes Relative: 35 %
Lymphs Abs: 2.7 10*3/uL (ref 0.7–4.0)
MCH: 24.5 pg — ABNORMAL LOW (ref 26.0–34.0)
MCHC: 31.3 g/dL (ref 30.0–36.0)
MCV: 78.3 fL — ABNORMAL LOW (ref 80.0–100.0)
Monocytes Absolute: 0.6 10*3/uL (ref 0.1–1.0)
Monocytes Relative: 8 %
Neutro Abs: 4.3 10*3/uL (ref 1.7–7.7)
Neutrophils Relative %: 57 %
Platelets: 227 10*3/uL (ref 150–400)
RBC: 4.33 MIL/uL (ref 3.87–5.11)
RDW: 14.1 % (ref 11.5–15.5)
WBC: 7.7 10*3/uL (ref 4.0–10.5)
nRBC: 0 % (ref 0.0–0.2)

## 2018-04-18 LAB — RETICULOCYTES
Immature Retic Fract: 8.9 % (ref 2.3–15.9)
RBC.: 4.33 MIL/uL (ref 3.87–5.11)
Retic Count, Absolute: 46.8 10*3/uL (ref 19.0–186.0)
Retic Ct Pct: 1.1 % (ref 0.4–3.1)

## 2018-04-19 LAB — FERRITIN: Ferritin: 45 ng/mL (ref 11–307)

## 2018-04-20 ENCOUNTER — Other Ambulatory Visit: Payer: Self-pay | Admitting: Family

## 2018-04-20 DIAGNOSIS — N6489 Other specified disorders of breast: Secondary | ICD-10-CM

## 2018-04-21 ENCOUNTER — Other Ambulatory Visit: Payer: Self-pay | Admitting: Gastroenterology

## 2018-04-24 ENCOUNTER — Other Ambulatory Visit: Payer: Self-pay | Admitting: Hematology

## 2018-04-29 ENCOUNTER — Ambulatory Visit: Payer: Medicare Other

## 2018-04-29 ENCOUNTER — Ambulatory Visit
Admission: RE | Admit: 2018-04-29 | Discharge: 2018-04-29 | Disposition: A | Discharge status: Home / Self Care | Payer: Medicare Other | Source: Ambulatory Visit | Attending: Family | Admitting: Family

## 2018-04-29 ENCOUNTER — Other Ambulatory Visit: Payer: Self-pay | Admitting: Family

## 2018-04-29 DIAGNOSIS — N6489 Other specified disorders of breast: Secondary | ICD-10-CM

## 2018-04-29 LAB — METHYLMALONIC ACID, SERUM: Methylmalonic Acid, Quantitative: 1751 nmol/L — ABNORMAL HIGH (ref 0–378)

## 2018-05-04 ENCOUNTER — Other Ambulatory Visit: Payer: Self-pay | Admitting: Hematology

## 2018-05-04 DIAGNOSIS — D519 Vitamin B12 deficiency anemia, unspecified: Secondary | ICD-10-CM

## 2018-05-05 ENCOUNTER — Telehealth: Payer: Self-pay

## 2018-05-05 NOTE — Telephone Encounter (Signed)
Spoke with patient regarding lab results, anemia has not improved but stable.  Per Dr. Burr Medico vitamin B12 level is low, recommend B12 injections monthly.  She is willing to do the injections stating that her GI doctor had her doing these for a while and she gave herself the injections.  Informed her I will check with Dr. Burr Medico to find out if we can do this.  Will call her back beginning of the week.  Also patient states she had stopped her ferrous sulfate.  Instructed her to resume this medication.

## 2018-05-05 NOTE — Telephone Encounter (Signed)
-----   Message from Truitt Merle, MD sent at 05/04/2018  3:02 PM EST ----- Please let pt know the lab result. Her anemia has not improved but overall stable. Her B12 level is low, and I recommend B12 injection monthly, which is faster replacement than oral. If she is not willing to have injections, please let her take oral B12 1026mcg daily. Will repeat lab on her next visit. Thanks   Truitt Merle  05/04/2018

## 2018-05-09 ENCOUNTER — Other Ambulatory Visit: Payer: Self-pay | Admitting: Gastroenterology

## 2018-05-09 ENCOUNTER — Other Ambulatory Visit: Payer: Self-pay | Admitting: Hematology

## 2018-05-09 MED ORDER — CYANOCOBALAMIN 1000 MCG/ML IJ SOLN: 1000.0000 ug | Freq: Once | INTRAMUSCULAR | 5 refills | Status: AC

## 2018-05-09 NOTE — Telephone Encounter (Signed)
I have called pt, she does not drive, so prefers to do the injections at home. I have called in B12 1074mcg injections to her pharmacy, please check if she needs orders for syringes and needles, thanks   Truitt Merle MD

## 2018-05-20 ENCOUNTER — Other Ambulatory Visit: Payer: Self-pay | Admitting: Gastroenterology

## 2018-05-20 NOTE — Telephone Encounter (Signed)
Is this ok to refill?  

## 2018-05-23 ENCOUNTER — Observation Stay (HOSPITAL_COMMUNITY)
Admission: EM | Admit: 2018-05-23 | Discharge: 2018-05-24 | Disposition: A | Discharge status: Home Health | Payer: Medicare Other | Attending: Family Medicine | Admitting: Family Medicine

## 2018-05-23 ENCOUNTER — Other Ambulatory Visit: Payer: Self-pay

## 2018-05-23 ENCOUNTER — Emergency Department (HOSPITAL_COMMUNITY): Payer: Medicare Other

## 2018-05-23 ENCOUNTER — Encounter (HOSPITAL_COMMUNITY): Payer: Self-pay

## 2018-05-23 DIAGNOSIS — R109 Unspecified abdominal pain: Secondary | ICD-10-CM

## 2018-05-23 DIAGNOSIS — R55 Syncope and collapse: Principal | ICD-10-CM | POA: Diagnosis present

## 2018-05-23 DIAGNOSIS — I4510 Unspecified right bundle-branch block: Secondary | ICD-10-CM | POA: Diagnosis not present

## 2018-05-23 DIAGNOSIS — D509 Iron deficiency anemia, unspecified: Secondary | ICD-10-CM | POA: Insufficient documentation

## 2018-05-23 DIAGNOSIS — Z7982 Long term (current) use of aspirin: Secondary | ICD-10-CM | POA: Insufficient documentation

## 2018-05-23 DIAGNOSIS — Z8673 Personal history of transient ischemic attack (TIA), and cerebral infarction without residual deficits: Secondary | ICD-10-CM | POA: Insufficient documentation

## 2018-05-23 DIAGNOSIS — K219 Gastro-esophageal reflux disease without esophagitis: Secondary | ICD-10-CM | POA: Diagnosis present

## 2018-05-23 DIAGNOSIS — M25561 Pain in right knee: Secondary | ICD-10-CM | POA: Diagnosis not present

## 2018-05-23 DIAGNOSIS — I951 Orthostatic hypotension: Secondary | ICD-10-CM | POA: Diagnosis present

## 2018-05-23 DIAGNOSIS — E538 Deficiency of other specified B group vitamins: Secondary | ICD-10-CM

## 2018-05-23 DIAGNOSIS — E119 Type 2 diabetes mellitus without complications: Secondary | ICD-10-CM

## 2018-05-23 DIAGNOSIS — R197 Diarrhea, unspecified: Secondary | ICD-10-CM | POA: Diagnosis present

## 2018-05-23 DIAGNOSIS — F17210 Nicotine dependence, cigarettes, uncomplicated: Secondary | ICD-10-CM | POA: Diagnosis not present

## 2018-05-23 DIAGNOSIS — F419 Anxiety disorder, unspecified: Secondary | ICD-10-CM | POA: Insufficient documentation

## 2018-05-23 DIAGNOSIS — G8929 Other chronic pain: Secondary | ICD-10-CM | POA: Diagnosis not present

## 2018-05-23 DIAGNOSIS — I252 Old myocardial infarction: Secondary | ICD-10-CM | POA: Insufficient documentation

## 2018-05-23 DIAGNOSIS — Z87898 Personal history of other specified conditions: Secondary | ICD-10-CM

## 2018-05-23 DIAGNOSIS — E861 Hypovolemia: Secondary | ICD-10-CM | POA: Diagnosis not present

## 2018-05-23 DIAGNOSIS — Z87442 Personal history of urinary calculi: Secondary | ICD-10-CM | POA: Insufficient documentation

## 2018-05-23 DIAGNOSIS — K5790 Diverticulosis of intestine, part unspecified, without perforation or abscess without bleeding: Secondary | ICD-10-CM | POA: Diagnosis not present

## 2018-05-23 DIAGNOSIS — I10 Essential (primary) hypertension: Secondary | ICD-10-CM | POA: Insufficient documentation

## 2018-05-23 DIAGNOSIS — H409 Unspecified glaucoma: Secondary | ICD-10-CM | POA: Insufficient documentation

## 2018-05-23 DIAGNOSIS — M25562 Pain in left knee: Secondary | ICD-10-CM | POA: Insufficient documentation

## 2018-05-23 DIAGNOSIS — Z86718 Personal history of other venous thrombosis and embolism: Secondary | ICD-10-CM | POA: Insufficient documentation

## 2018-05-23 DIAGNOSIS — Z79899 Other long term (current) drug therapy: Secondary | ICD-10-CM

## 2018-05-23 DIAGNOSIS — F329 Major depressive disorder, single episode, unspecified: Secondary | ICD-10-CM | POA: Diagnosis not present

## 2018-05-23 DIAGNOSIS — Z980 Intestinal bypass and anastomosis status: Secondary | ICD-10-CM

## 2018-05-23 DIAGNOSIS — Z903 Acquired absence of stomach [part of]: Secondary | ICD-10-CM

## 2018-05-23 DIAGNOSIS — K580 Irritable bowel syndrome with diarrhea: Secondary | ICD-10-CM | POA: Diagnosis not present

## 2018-05-23 LAB — BASIC METABOLIC PANEL
Anion gap: 11 (ref 5–15)
BUN: 9 mg/dL (ref 8–23)
CO2: 21 mmol/L — ABNORMAL LOW (ref 22–32)
Calcium: 9.1 mg/dL (ref 8.9–10.3)
Chloride: 107 mmol/L (ref 98–111)
Creatinine, Ser: 0.86 mg/dL (ref 0.44–1.00)
GFR calc Af Amer: >60 mL/min (ref >60)
GFR calc non Af Amer: >60 mL/min (ref >60)
Glucose, Bld: 108 mg/dL — ABNORMAL HIGH (ref 70–99)
Potassium: 4.3 mmol/L (ref 3.5–5.1)
Sodium: 139 mmol/L (ref 135–145)

## 2018-05-23 LAB — CBC
HCT: 37.2 % (ref 36.0–46.0)
Hemoglobin: 11.4 g/dL — ABNORMAL LOW (ref 12.0–15.0)
MCH: 24.3 pg — ABNORMAL LOW (ref 26.0–34.0)
MCHC: 30.6 g/dL (ref 30.0–36.0)
MCV: 79.3 fL — ABNORMAL LOW (ref 80.0–100.0)
Platelets: 195 10*3/uL (ref 150–400)
RBC: 4.69 MIL/uL (ref 3.87–5.11)
RDW: 14.1 % (ref 11.5–15.5)
WBC: 6.3 10*3/uL (ref 4.0–10.5)
nRBC: 0 % (ref 0.0–0.2)

## 2018-05-23 LAB — TROPONIN I: Troponin I: <0.03 ng/mL (ref <0.03)

## 2018-05-23 LAB — URINALYSIS, ROUTINE W REFLEX MICROSCOPIC
Bilirubin Urine: NEGATIVE
Glucose, UA: NEGATIVE mg/dL
Hgb urine dipstick: NEGATIVE
Ketones, ur: NEGATIVE mg/dL
Leukocytes, UA: NEGATIVE
Nitrite: NEGATIVE
Protein, ur: NEGATIVE mg/dL
Specific Gravity, Urine: 1.002 — ABNORMAL LOW (ref 1.005–1.030)
pH: 6 (ref 5.0–8.0)

## 2018-05-23 LAB — TSH: TSH: 1.559 u[IU]/mL (ref 0.350–4.500)

## 2018-05-23 MED ORDER — LATANOPROST 0.005 % OP SOLN
1.0000 [drp] | Freq: Every day | OPHTHALMIC | Status: DC
  Administered 2018-05-24: 1 [drp] via OPHTHALMIC
  Filled 2018-05-23: qty 2.5

## 2018-05-23 MED ORDER — LOSARTAN POTASSIUM 50 MG PO TABS
50.0000 mg | ORAL_TABLET | Freq: Every day | ORAL | Status: DC
  Administered 2018-05-23 – 2018-05-24 (×2): 50 mg via ORAL
  Filled 2018-05-23 (×2): qty 1

## 2018-05-23 MED ORDER — PANTOPRAZOLE SODIUM 40 MG PO TBEC
40.0000 mg | DELAYED_RELEASE_TABLET | Freq: Every day | ORAL | Status: DC
  Administered 2018-05-23 – 2018-05-24 (×2): 40 mg via ORAL
  Filled 2018-05-23 (×2): qty 1

## 2018-05-23 MED ORDER — ACETAMINOPHEN 650 MG RE SUPP: 650.0000 mg | Freq: Four times a day (QID) | RECTAL | Status: DC | PRN

## 2018-05-23 MED ORDER — SODIUM CHLORIDE 0.9% FLUSH
3.0000 mL | Freq: Once | INTRAVENOUS | Status: AC
  Administered 2018-05-23: 3 mL via INTRAVENOUS

## 2018-05-23 MED ORDER — SODIUM CHLORIDE 0.9% FLUSH
3.0000 mL | Freq: Two times a day (BID) | INTRAVENOUS | Status: DC
  Administered 2018-05-24: 3 mL via INTRAVENOUS

## 2018-05-23 MED ORDER — CYCLOSPORINE 0.05 % OP EMUL
1.0000 [drp] | Freq: Two times a day (BID) | OPHTHALMIC | Status: DC
  Administered 2018-05-24: 1 [drp] via OPHTHALMIC
  Filled 2018-05-23: qty 1

## 2018-05-23 MED ORDER — SODIUM CHLORIDE 0.9 % IV SOLN
INTRAVENOUS | Status: AC
  Administered 2018-05-23: 19:00:00 via INTRAVENOUS

## 2018-05-23 MED ORDER — GABAPENTIN 100 MG PO CAPS
200.0000 mg | ORAL_CAPSULE | Freq: Two times a day (BID) | ORAL | Status: DC
  Administered 2018-05-24 (×2): 200 mg via ORAL
  Filled 2018-05-23 (×2): qty 2

## 2018-05-23 MED ORDER — ENOXAPARIN SODIUM 40 MG/0.4ML ~~LOC~~ SOLN
40.0000 mg | SUBCUTANEOUS | Status: DC
  Administered 2018-05-24: 40 mg via SUBCUTANEOUS
  Filled 2018-05-23: qty 0.4

## 2018-05-23 MED ORDER — ACETAMINOPHEN 500 MG PO TABS
1000.0000 mg | ORAL_TABLET | Freq: Once | ORAL | Status: AC
  Administered 2018-05-23: 1000 mg via ORAL
  Filled 2018-05-23: qty 2

## 2018-05-23 MED ORDER — PAROXETINE HCL 20 MG PO TABS
20.0000 mg | ORAL_TABLET | Freq: Every evening | ORAL | Status: DC
  Administered 2018-05-24: 20 mg via ORAL
  Filled 2018-05-23: qty 1

## 2018-05-23 MED ORDER — ACETAMINOPHEN 325 MG PO TABS
650.0000 mg | ORAL_TABLET | Freq: Four times a day (QID) | ORAL | Status: DC | PRN
  Administered 2018-05-24 (×2): 650 mg via ORAL
  Filled 2018-05-23 (×2): qty 2

## 2018-05-23 MED ORDER — MECLIZINE HCL 25 MG PO TABS
12.5000 mg | ORAL_TABLET | Freq: Two times a day (BID) | ORAL | Status: DC | PRN
  Administered 2018-05-24: 12.5 mg via ORAL
  Filled 2018-05-23: qty 1
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
## 2018-05-23 NOTE — ED Notes (Signed)
UA and urine culture collected at this time. Pt was able to ambulate to bathroom with minor assistance from this tech.

## 2018-05-23 NOTE — ED Triage Notes (Signed)
Patient states that her IBS has been acting up more than normal the past 3 days with increased diarrhea. This am felt weak in her legs and had a syncopal event while standing up, her son caught her and laid her on bed. Reports took her vertigo meds and now feels fine and the diarrhea improved. Alert and oriented, no deficits on arrival

## 2018-05-23 NOTE — ED Notes (Addendum)
Attempted report and was placed on hold for 5 minutes. Called back @ 1735 and was transferred to the RN Santiago Glad, who advised she could not take report because she was still in report. RN then hung up the phone.

## 2018-05-24 DIAGNOSIS — E538 Deficiency of other specified B group vitamins: Secondary | ICD-10-CM

## 2018-05-24 DIAGNOSIS — R197 Diarrhea, unspecified: Secondary | ICD-10-CM | POA: Diagnosis not present

## 2018-05-24 DIAGNOSIS — R55 Syncope and collapse: Secondary | ICD-10-CM

## 2018-05-24 DIAGNOSIS — I10 Essential (primary) hypertension: Secondary | ICD-10-CM | POA: Diagnosis not present

## 2018-05-24 DIAGNOSIS — R109 Unspecified abdominal pain: Secondary | ICD-10-CM

## 2018-05-24 DIAGNOSIS — Z87898 Personal history of other specified conditions: Secondary | ICD-10-CM

## 2018-05-24 DIAGNOSIS — E119 Type 2 diabetes mellitus without complications: Secondary | ICD-10-CM | POA: Diagnosis not present

## 2018-05-24 DIAGNOSIS — I951 Orthostatic hypotension: Secondary | ICD-10-CM

## 2018-05-24 DIAGNOSIS — Z79899 Other long term (current) drug therapy: Secondary | ICD-10-CM

## 2018-05-24 DIAGNOSIS — Z903 Acquired absence of stomach [part of]: Secondary | ICD-10-CM

## 2018-05-24 DIAGNOSIS — Z980 Intestinal bypass and anastomosis status: Secondary | ICD-10-CM

## 2018-05-24 DIAGNOSIS — G8929 Other chronic pain: Secondary | ICD-10-CM

## 2018-05-24 DIAGNOSIS — K219 Gastro-esophageal reflux disease without esophagitis: Secondary | ICD-10-CM | POA: Diagnosis not present

## 2018-05-24 LAB — CBC
HCT: 38.3 % (ref 36.0–46.0)
Hemoglobin: 12 g/dL (ref 12.0–15.0)
MCH: 24.5 pg — ABNORMAL LOW (ref 26.0–34.0)
MCHC: 31.3 g/dL (ref 30.0–36.0)
MCV: 78.2 fL — ABNORMAL LOW (ref 80.0–100.0)
Platelets: 250 10*3/uL (ref 150–400)
RBC: 4.9 MIL/uL (ref 3.87–5.11)
RDW: 14 % (ref 11.5–15.5)
WBC: 8.7 10*3/uL (ref 4.0–10.5)
nRBC: 0 % (ref 0.0–0.2)

## 2018-05-24 LAB — BASIC METABOLIC PANEL
Anion gap: 10 (ref 5–15)
BUN: 7 mg/dL — ABNORMAL LOW (ref 8–23)
CO2: 23 mmol/L (ref 22–32)
Calcium: 9.1 mg/dL (ref 8.9–10.3)
Chloride: 105 mmol/L (ref 98–111)
Creatinine, Ser: 0.8 mg/dL (ref 0.44–1.00)
GFR calc Af Amer: >60 mL/min (ref >60)
GFR calc non Af Amer: >60 mL/min (ref >60)
Glucose, Bld: 86 mg/dL (ref 70–99)
Potassium: 3.9 mmol/L (ref 3.5–5.1)
Sodium: 138 mmol/L (ref 135–145)

## 2018-05-24 MED ORDER — SERTRALINE HCL 50 MG PO TABS
50.0000 mg | ORAL_TABLET | Freq: Every day | ORAL | Status: DC
  Administered 2018-05-24: 50 mg via ORAL
  Filled 2018-05-24: qty 1

## 2018-05-24 MED ORDER — SERTRALINE HCL 50 MG PO TABS: 50.0000 mg | ORAL_TABLET | Freq: Every day | ORAL | 0 refills | Status: DC

## 2018-05-24 MED ORDER — LIDOCAINE 5 % EX PTCH
1.0000 | MEDICATED_PATCH | CUTANEOUS | Status: DC
  Administered 2018-05-24: 1 via TRANSDERMAL
  Filled 2018-05-24: qty 1

## 2018-05-24 MED ORDER — PROMETHAZINE HCL 25 MG PO TABS
12.5000 mg | ORAL_TABLET | Freq: Four times a day (QID) | ORAL | Status: DC | PRN
  Administered 2018-05-24: 12.5 mg via ORAL
  Filled 2018-05-24: qty 1
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
## 2018-05-24 NOTE — Progress Notes (Signed)
Progress Note  Patient Name: Emily [name] Date of Encounter: 05/24/2018  Primary Cardiologist:   Minus Breeding, MD   Subjective   She is nauseated.  Orthostatic BP drop again measured this morning.   Inpatient Medications    Scheduled Meds: . cycloSPORINE  1 drop Both Eyes BID  . enoxaparin (LOVENOX) injection  40 mg Subcutaneous Q24H  . gabapentin  200 mg Oral BID  . latanoprost  1 drop Both Eyes QHS  . lidocaine  1 patch Transdermal Q24H  . losartan  50 mg Oral Daily  . pantoprazole  40 mg Oral Daily  . PARoxetine  20 mg Oral QPM  . sodium chloride flush  3 mL Intravenous Q12H   Continuous Infusions:  PRN Meds: acetaminophen **OR** acetaminophen, meclizine, promethazine   Vital Signs    Vitals:   05/24/18 0440 05/24/18 0442 05/24/18 0443 05/24/18 0849  BP:  (!) 167/86 (!) 159/86 (!) 162/84  Pulse:  (!) 107 (!) 104 86  Resp:    19  Temp:    98.5 F (36.9 C)  TempSrc:    Oral  SpO2:  98%  96%  Weight: 59.6 kg     Height: 5\' 3"  (1.6 m)       Intake/Output Summary (Last 24 hours) at 05/24/2018 0940 Last data filed at 05/24/2018 0800 Gross per 24 hour  Intake 744.59 ml  Output 500 ml  Net 244.59 ml   Filed Weights   05/23/18 1428 05/23/18 2006 05/24/18 0440  Weight: 61.9 kg 60.2 kg 59.6 kg    Telemetry    NSR, rare PVCs - Personally Reviewed  ECG    NA - Personally Reviewed  Physical Exam   GEN: No acute distress.   Neck: No  JVD Cardiac: RRR, no murmurs, rubs, or gallops.  Respiratory: Clear  to auscultation bilaterally. GI: Soft, nontender, non-distended  MS: No  edema; No deformity. Neuro:  Nonfocal  Psych: Normal affect   Labs    Chemistry Recent Labs  Lab 05/23/18 1406 05/24/18 0456  NA 139 138  K 4.3 3.9  CL 107 105  CO2 21* 23  GLUCOSE 108* 86  BUN 9 7*  CREATININE 0.86 0.80  CALCIUM 9.1 9.1  GFRNONAA >60 >60  GFRAA >60 >60  ANIONGAP 11 10     Hematology Recent Labs  Lab 05/23/18 1406 05/24/18 0456  WBC 6.3  8.7  RBC 4.69 4.90  HGB 11.4* 12.0  HCT 37.2 38.3  MCV 79.3* 78.2*  MCH 24.3* 24.5*  MCHC 30.6 31.3  RDW 14.1 14.0  PLT 195 250    Cardiac Enzymes Recent Labs  Lab 05/23/18 1425  TROPONINI <0.03   No results for input(s): TROPIPOC in the last 168 hours.   BNPNo results for input(s): BNP, PROBNP in the last 168 hours.   DDimer No results for input(s): DDIMER in the last 168 hours.   Radiology    Ct Head Wo Contrast  Result Date: 05/23/2018 CLINICAL DATA:  Bilateral leg weakness and syncope today. EXAM: CT HEAD WITHOUT CONTRAST TECHNIQUE: Contiguous axial images were obtained from the base of the skull through the vertex without intravenous contrast. COMPARISON:  Brain MR dated 10/30/2017 and head CT dated 06/19/2017. FINDINGS: Brain: Mildly enlarged ventricles and cortical sulci. Mild patchy white matter low density in both cerebral hemispheres. No intracranial hemorrhage, mass lesion or CT evidence of acute infarction. Vascular: No hyperdense vessel or unexpected calcification. Skull: Stable circumscribed lucency in the clivus with a thin surrounding sclerotic  rim. Otherwise, appearing skull. Sinuses/Orbits: Status post cataract extraction on the left. Unremarkable paranasal sinuses. Other: None. IMPRESSION: 1. No acute abnormality. 2. Mild diffuse cerebral and cerebellar atrophy. 3. Mild chronic small vessel white matter ischemic changes in both cerebral hemispheres. Electronically Signed   By: Claudie Revering M.D.   On: 05/23/2018 15:55    Cardiac Studies   NA  Patient Profile     78 y.o. female with a hx of palpitations w/ ST on event monitor 2017, CP w/ sub-optimal GXT 2017, panic attacks, DM, HTN, DVT RLE 04/2018  who is being seen today for the evaluation of syncope at the request of Dr Yisroel Ramming.  Assessment & Plan    SYNCOPE:  Positive significant orthostatic BP drop this admission.  Event felt to be multifactorial.  Vagal and intravascular volume depletion.  Now hydrated.   Although intake and output look incomplete.   No further in patient cardiac work up is indicated.   HTN:  Difficult issue with the orthostasis.  Allow some permissive HTN.  However, can titrate meds for supine HTN if she is using mechanical means to avoid upright hypotension (thigh high compression stockings +/- an abdominal binder.)    For questions or updates, please contact [facility] Please consult [email or web address] for contact info under Cardiology/STEMI.   Signed, Minus Breeding, MD  05/24/2018, 9:40 AM

## 2018-05-24 NOTE — Plan of Care (Signed)
  Problem: Activity: Goal: Risk for activity intolerance will decrease Outcome: Progressing   Problem: Elimination: Goal: Will not experience complications related to bowel motility Outcome: Progressing   Problem: Safety: Goal: Ability to remain free from injury will improve Outcome: Progressing   

## 2018-05-24 NOTE — Discharge Instructions (Signed)
We have discontinued some of your medications that can possibly make you dizzy.  These include linzess, bentyl, meclizine, and oxybutynin.  You can continue your phenergan for nausea but this too can contribute to your dizziness.  We have also changed your paroxetine to sertraline. These are the same type of drug but the sertraline has less side effects.    Because you have both constipation and diarrhea some of these medications may not be appropriate anyway.  Fiber supplements like metamucil are great at improving both diarrhea and constipation.    I would like you to take at least 3 8 oz glasses of water each day in addition to whatever else you drink.  This will help you from becoming dehydrated which can lead to dizziness.    I would also like you to wear compression stockings to help keep the blood from pooling in your veins.    It will also help for you to stand up slowly when you get up and wait about ten seconds before you start to walk.    Cardiology also increased your losartan from 25mg  to 50 mg because your blood pressure was elevated.    Pleas see your primary care doctor in one to two weeks to see if these changes are suitable to you.      Syncope Syncope is when you pass out (faint) for a short time. It is caused by a sudden decrease in blood flow to the brain. Signs that you may be about to pass out include:  Feeling dizzy or light-headed.  Feeling sick to your stomach (nauseous).  Seeing all white or all black.  Having cold, clammy skin. If you pass out, get help right away. Call your local emergency services (911 in the [place] Do not drive yourself to the hospital. Follow these instructions at home: Watch for any changes in your symptoms. Take these actions to stay safe and help with your symptoms: Lifestyle  Do not drive, use machinery, or play sports until your doctor says it is okay.  Do not drink alcohol.  Do not use any products that contain nicotine or  tobacco, such as cigarettes and e-cigarettes. If you need help quitting, ask your doctor.  Drink enough fluid to keep your pee (urine) pale yellow. General instructions  Take over-the-counter and prescription medicines only as told by your doctor.  If you are taking blood pressure or heart medicine, sit up and stand up slowly. Spend a few minutes getting ready to sit and then stand. This can help you feel less dizzy.  Have someone stay with you until you feel stable.  If you start to feel like you might pass out, lie down right away and raise (elevate) your feet above the level of your heart. Breathe deeply and steadily. Wait until all of the symptoms are gone.  Keep all follow-up visits as told by your doctor. This is important. Get help right away if:  You have a very bad headache.  You pass out once or more than once.  You have pain in your chest, belly, or back.  You have a very fast or uneven heartbeat (palpitations).  It hurts to breathe.  You are bleeding from your mouth or your bottom (rectum).  You have black or tarry poop (stool).  You have jerky movements that you cannot control (seizure).  You are confused.  You have trouble walking.  You are very weak.  You have vision problems. These symptoms may be an emergency.  Do not wait to see if the symptoms will go away. Get medical help right away. Call your local emergency services (911 in the [place] Do not drive yourself to the hospital. Summary  Syncope is when you pass out (faint) for a short time. It is caused by a sudden decrease in blood flow to the brain.  Signs that you may be about to faint include feeling dizzy, light-headed, or sick to your stomach, seeing all white or all black, or having cold, clammy skin.  If you start to feel like you might pass out, lie down right away and raise (elevate) your feet above the level of your heart. Breathe deeply and steadily. Wait until all of the symptoms are  gone. This information is not intended to replace advice given to you by your health care provider. Make sure you discuss any questions you have with your health care provider. Document Released: 10/07/2007 Document Revised: 06/02/2017 Document Reviewed: 06/02/2017 Elsevier Interactive Patient Education  2019 [employer].

## 2018-05-24 NOTE — Discharge Summary (Signed)
[facility] Discharge Summary  Patient name: Emily [name] Medical record number: [id number] Date of birth: 1940-12-23 Age: 78 y.o. Gender: female Date of Admission: 05/23/2018  Date of Discharge: 05/26/2018 Admitting Physician: Benay Pike, MD  Primary Care Provider: Tsosie Billing, MD Consultants: cardiology  Indication for Hospitalization: syncope  Discharge Diagnoses/Problem List:  Syncope DM-II Vertigo Anemia HTN GERD IBS-D Anxiety Abdominal pain  Disposition: home  Discharge Condition: improved, stable  Discharge Exam:  BP (!) 153/72 (BP Location: Left Arm)   Pulse 82   Temp 98 F (36.7 C) (Oral)   Resp 17   Ht 5\' 3"  (1.6 m)   Wt 59.6 kg   SpO2 99%   BMI 23.26 kg/m  Gen: NAD, alert, non-toxic, well-nourished, well-appearing, sitting comfortably  HEENT: no scleral icterus  CV: Regular rate and rhythm. 2+ radial pulses bilaterally. No murmurs.  Resp: Clear to auscultation bilaterally.  No wheezing, rales, abnormal lung sounds.  No increased work of breathing appreciated. Abd: tender in upper quandrants.  Normal bowel sounds.  Psych: Cooperative with exam. Pleasant. Makes eye contact. Extremities: Full ROM   Brief Hospital Course:  Patient arrived at ED via EMS complaining of 2 episodes of syncope that day.  Patient was given fluids. Troponin and TSH were normal. Urinalysis was normal. CT head showed no acute abnormalities.  Cardiology was consulted and believed it to be due to combination of volume depletion and vagal.  Did not pursue any further workup.  The next morning the patient worked with PT and ambulated out of bed several times without syncopal/presyncopal event.  She was discharged home later that day with home health followup.    Issues for Follow Up:  1. Syncope - cards consulted, agreed it was likely combination of poor hydration causing orthostatic syncope and vasovagal. Discussed compression socks with  patient to reduce venous stasis and remove some medications that can cause anticholinergic side effects.  2. Medication changes - stopped several medications on discharge including bentyl, linzess, oxybutynin,  Antivert, zanaflex.  3. Hypovolemia - patient states she only drinks about 8 ounces of water a day along with soda. We discussed trying to get at least 3 glasses of water daily in addition to her other drinks.  4. IBS - patient has both diarea and constipation.  Consider diet changes and addition of fiber products in place of her previous anti-diarrheals/stimulants 5. Depression/anxiety - discontinued paroxetine due to increased side effects compared to other SSRIs and started sertraline.    Significant Procedures: none  Significant Labs and Imaging:  Recent Labs  Lab 05/23/18 1406 05/24/18 0456  WBC 6.3 8.7  HGB 11.4* 12.0  HCT 37.2 38.3  PLT 195 250   Recent Labs  Lab 05/23/18 1406 05/24/18 0456  NA 139 138  K 4.3 3.9  CL 107 105  CO2 21* 23  GLUCOSE 108* 86  BUN 9 7*  CREATININE 0.86 0.80  CALCIUM 9.1 9.1     Results/Tests Pending at Time of Discharge:   Discharge Medications:  Allergies as of 05/24/2018   No Known Allergies     Medication List    STOP taking these medications   acetaminophen 500 MG tablet Commonly known as:  TYLENOL   dicyclomine 20 MG tablet Commonly known as:  BENTYL   LINZESS 72 MCG capsule Generic drug:  linaclotide   meclizine 12.5 MG tablet Commonly known as:  ANTIVERT   oxybutynin 5 MG tablet Commonly known as:  KKXFGHWE  PARoxetine 20 MG tablet Commonly known as:  PAXIL   tiZANidine 4 MG tablet Commonly known as:  ZANAFLEX     TAKE these medications   aspirin 81 MG tablet Take 81 mg by mouth daily.   clotrimazole-betamethasone cream Commonly known as:  LOTRISONE Apply 1 application topically 2 (two) times daily as needed (rash).   esomeprazole 40 MG capsule Commonly known as:  NEXIUM Take 1 capsule (40 mg  total) by mouth 2 (two) times daily.   gabapentin 100 MG capsule Commonly known as:  NEURONTIN Take 200 mg by mouth 2 (two) times daily.   losartan 25 MG tablet Commonly known as:  COZAAR Take 50 mg by mouth daily.   magic mouthwash w/lidocaine Soln Take 5 mLs by mouth every 6 (six) hours as needed for mouth pain.   promethazine 12.5 MG tablet Commonly known as:  PHENERGAN TAKE 1 TABLET(12.5 MG) BY MOUTH EVERY 6 HOURS AS NEEDED FOR NAUSEA OR VOMITING What changed:  Another medication with the same name was removed. Continue taking this medication, and follow the directions you see here.   RESTASIS 0.05 % ophthalmic emulsion Generic drug:  cycloSPORINE Place 1 drop into both eyes 2 (two) times daily.   sertraline 50 MG tablet Commonly known as:  ZOLOFT Take 1 tablet (50 mg total) by mouth daily for 30 days.   sucralfate 1 GM/10ML suspension Commonly known as:  CARAFATE Take 10 mLs (1 g total) by mouth 4 (four) times daily -  with meals and at bedtime.   Travoprost (BAK Free) 0.004 % Soln ophthalmic solution Commonly known as:  TRAVATAN Place 1 drop into both eyes at bedtime.   VITAMIN B-12 PO Take 1 tablet by mouth daily.   Vitamin D (Ergocalciferol) 1.25 MG (50000 UT) Caps capsule Commonly known as:  DRISDOL Take 50,000 Units by mouth every Thursday.       Discharge Instructions: Please refer to Patient Instructions section of EMR for full details.  Patient was counseled important signs and symptoms that should prompt return to medical care, changes in medications, dietary instructions, activity restrictions, and follow up appointments.   Follow-Up Appointments: Follow-up Information    Tsosie Billing, MD. Go on 05/26/2018.   Specialty:  Internal Medicine Why:  please make an appointment for your primary care doctor for 1-2 weeks.@10 :40am and Lucianne Lei will pick you up @10 :20am Contact information: [place] [address] [phone]         [facility], [facility]-Home Follow up.   Specialty:  [place] Why:  Home Health Physical Therapy, Occupational Therapy-agency will call to arrange initial appointment Contact information: [address] [place] [address] [phone]           Benay Pike, MD 05/26/2018, 7:19 PM PGY-1, [place]

## 2018-05-24 NOTE — Progress Notes (Signed)
Physical Therapy Treatment Patient Details Name: Emily [name] MRN: [id number] DOB: Sep 26, 1940 Today's Date: 05/24/2018    History of Present Illness Pt is a 78 yo female admitted 05/23/18 after having 2x syncopal episodes where pt fell onto bed and was guided to floor by family. PMH includes DM2, HTN, anemia, GERD, L ear tinnitus, arthritis.    PT Comments    Pt seen for additional session for gait evaluation as too nausea to ambulate this morning and will d/c home today. Pt ambulatory with RW at supervision-level; discussed use of RW insteady of SPC for added stability, pt receptive to this. Pt declining stair training. Owns necessary equipment and will have assist from family. If to remain admitted, will follow acutely.   Follow Up Recommendations  No PT follow up;Supervision for mobility/OOB     Equipment Recommendations  None recommended by PT    Recommendations for Other Services       Precautions / Restrictions Precautions Precautions: Other (comment);Fall Precaution Comments: orthostatic hypotension Restrictions Weight Bearing Restrictions: No    Mobility  Bed Mobility               General bed mobility comments: Received sitting in recliner  Transfers Overall transfer level: Modified independent Equipment used: Rolling walker (2 wheeled) Transfers: Sit to/from Stand              Ambulation/Gait Ambulation/Gait assistance: Supervision;Min guard Gait Distance (Feet): 200 Feet Assistive device: Rolling walker (2 wheeled) Gait Pattern/deviations: Step-through pattern;Decreased stride length;Trunk flexed Gait velocity: Decreased Gait velocity interpretation: 1.31 - 2.62 ft/sec, indicative of limited community ambulator General Gait Details: Slow, mostly steady gait with RW at supervision-level; 1x episode self-corrected instability with turning. Cues for technique, this did not occur with additional turns   Stairs Stairs: (Pt declined; reports family  will assist her on 3 steps into home)           Wheelchair Mobility    Modified Rankin (Stroke Patients Only)       Balance Overall balance assessment: Needs assistance   Sitting balance-Leahy Scale: Good       Standing balance-Leahy Scale: Fair Standing balance comment: Can static stand without UE support                            Cognition Arousal/Alertness: Awake/alert Behavior During Therapy: WFL for tasks assessed/performed Overall Cognitive Status: Within Functional Limits for tasks assessed                                        Exercises      General Comments        Pertinent Vitals/Pain Pain Assessment: No/denies pain    Home Living                      Prior Function            PT Goals (current goals can now be found in the care plan section) Acute Rehab PT Goals Patient Stated Goal: Go home today PT Goal Formulation: With patient Time For Goal Achievement: 06/07/18 Potential to Achieve Goals: Good Progress towards PT goals: Progressing toward goals    Frequency    Min 3X/week      PT Plan Current plan remains appropriate    Co-evaluation  AM-PAC PT "6 Clicks" Mobility   Outcome Measure  Help needed turning from your back to your side while in a flat bed without using bedrails?: None Help needed moving from lying on your back to sitting on the side of a flat bed without using bedrails?: None Help needed moving to and from a bed to a chair (including a wheelchair)?: None Help needed standing up from a chair using your arms (e.g., wheelchair or bedside chair)?: None Help needed to walk in hospital room?: A Little Help needed climbing 3-5 steps with a railing? : A Little 6 Click Score: 22    End of Session Equipment Utilized During Treatment: Gait belt Activity Tolerance: Patient tolerated treatment well Patient left: in chair;with call bell/phone within reach;with  nursing/sitter in room Nurse Communication: Mobility status PT Visit Diagnosis: Other abnormalities of gait and mobility (R26.89)     Time: 3244-0102 PT Time Calculation (min) (ACUTE ONLY): 12 min  Charges:  $Gait Training: 8-22 mins                    Mabeline Caras, PT, DPT Acute Rehabilitation Services  Pager [phone] Office [phone]  Derry Lory 05/24/2018, 2:38 PM

## 2018-05-24 NOTE — Progress Notes (Signed)
Pt discharged to home. Brother in [place] met pt at room and them met patient at front entrance. Pt taken to entrance in wheelchair by [place]. Pt given discharge paperwork and education and verbally gave direction back to RN and clarified medication changes. Pt understands discharge instructions and follow up appointments

## 2018-05-24 NOTE — Care Management Note (Signed)
Case Management Note  Patient Details  Name: Emily [name] MRN: [id number] Date of Birth: 1941/01/25  Subjective/Objective:  Syncopal episode                    Action/Plan: Spoke to pt and offered choice for HH/CMS list provided and placed on chart. Pt states she had [facility] in the past. She has Rollator, RW an bedside commode at home. Contacted [facility] with new referral.   PCP Dr Mila Palmer   Expected Discharge Date:  05/24/18               Expected Discharge Plan:  [place]  In-House Referral:  NA  Discharge planning Services  CM Consult  Post Acute Care Choice:  Home Health Choice offered to:  Patient  DME Arranged:  N/A DME Agency:  NA  HH Arranged:  PT, OT HH Agency:  [place]  Status of Service:  Completed, signed off  If discussed at [place] of Stay Meetings, dates discussed:    Additional Comments:  Erenest Rasher, RN 05/24/2018, 2:23 PM

## 2018-05-24 NOTE — Evaluation (Signed)
Physical Therapy Evaluation Patient Details Name: Emily [name] MRN: [id number] DOB: 09-20-1940 Today's Date: 05/24/2018   History of Present Illness  Pt is a 78 yo female admitted 05/23/18 after having 2x syncopal episodes where pt fell onto bed and was guided to floor by family. PMH includes DM2, HTN, anemia, GERD, L ear tinnitus, arthritis.     Clinical Impression  Pt presents with an overall decrease in functional mobility secondary to above. PTA, pt mod indep with RW; has multiple supportive family members nearby, as well as aide assist for ADLs and household tasks 7x/wk. Today's evaluation limited secondary to pt's nausea; (+) orthostatic hypotension upon initial standing x2 trials (see values below). Increased time spent discussing strategies for fall risk reduction with orthostatic hypotension at home. Pt pleasantly declining HHPT services. Would benefit from continued acute PT services to maximize functional mobility and independence prior to d/c home.   Orthostatic BPs  Reclined in chair 159/91  Sitting upright 169/71  Standing 143/90  Sitting ~2 min 164/105  Standing 136/78  Standing 2 min 164/75       Follow Up Recommendations No PT follow up;Supervision for mobility/OOB    Equipment Recommendations  None recommended by PT    Recommendations for Other Services       Precautions / Restrictions Precautions Precautions: Other (comment);Fall Precaution Comments: (+) orthostatic hypotension upon standing Restrictions Weight Bearing Restrictions: No      Mobility  Bed Mobility Overal bed mobility: Needs Assistance Bed Mobility: Supine to Sit;Sit to Supine     Supine to sit: Supervision Sit to supine: Supervision   General bed mobility comments: Received sitting in recliner  Transfers Overall transfer level: Needs assistance Equipment used: Rolling walker (2 wheeled) Transfers: Sit to/from Stand Sit to Stand: Supervision Stand pivot transfers: Min guard        General transfer comment: (+) orthostatic upon standing; cues for correct hand placement, supervision for safety. Stood 2x trials with prolonged standing  Ambulation/Gait             General Gait Details: Unable secondary to nausea  Stairs            Wheelchair Mobility    Modified Rankin (Stroke Patients Only)       Balance Overall balance assessment: Needs assistance   Sitting balance-Leahy Scale: Good       Standing balance-Leahy Scale: Fair Standing balance comment: Can static stand without UE support                             Pertinent Vitals/Pain Pain Assessment: Faces Pain Score: 4  Faces Pain Scale: Hurts a little bit Pain Location: Abdomen/nausea Pain Descriptors / Indicators: Discomfort;Grimacing Pain Intervention(s): Monitored during session;Limited activity within patient's tolerance;Other (comment)(Requesting meds for nausea)    Home Living Family/patient expects to be discharged to:: Private residence Living Arrangements: Alone Available Help at Discharge: Family;Personal care attendant;Available 24 hours/day Type of Home: House Home Access: Stairs to enter Entrance Stairs-Rails: Chemical engineer of Steps: 3 Home Layout: One level Home Equipment: Walker - 2 wheels;Walker - 4 wheels;Shower seat;Grab bars - toilet Additional Comments: Multiple family members nearby; can provide initial 24/7 assist if needed    Prior Function Level of Independence: Needs assistance   Gait / Transfers Assistance Needed: Ambulatory in house with RW or furniture surfing; uses RW outside. Someone always present when pt descending/ascending entrance steps. Pt reports intermittent dizziness upon standing at home  ADL's /  Homemaking Assistance Needed: Aide assists daily with household tasks (cleaning, cooking) and intermittently assists with ADLs (bathing, dressing)  Comments: uses RW in the home.  Needs assist bathing and  dressing safely     Hand Dominance   Dominant Hand: Right    Extremity/Trunk Assessment   Upper Extremity Assessment Upper Extremity Assessment: Generalized weakness    Lower Extremity Assessment Lower Extremity Assessment: Overall WFL for tasks assessed    Cervical / Trunk Assessment Cervical / Trunk Assessment: Normal  Communication   Communication: No difficulties  Cognition Arousal/Alertness: Awake/alert Behavior During Therapy: WFL for tasks assessed/performed Overall Cognitive Status: Within Functional Limits for tasks assessed                                        General Comments General comments (skin integrity, edema, etc.): X1 BM, RN aware    Exercises     Assessment/Plan    PT Assessment Patient needs continued PT services  PT Problem List Decreased activity tolerance;Decreased balance;Decreased mobility       PT Treatment Interventions DME instruction;Gait training;Stair training;Functional mobility training;Therapeutic activities;Therapeutic exercise;Balance training;Patient/family education    PT Goals (Current goals can be found in the Care Plan section)  Acute Rehab PT Goals Patient Stated Goal: to go home without stomach pain PT Goal Formulation: With patient Time For Goal Achievement: 06/07/18 Potential to Achieve Goals: Good    Frequency Min 3X/week   Barriers to discharge        Co-evaluation               AM-PAC PT "6 Clicks" Mobility  Outcome Measure Help needed turning from your back to your side while in a flat bed without using bedrails?: None Help needed moving from lying on your back to sitting on the side of a flat bed without using bedrails?: None Help needed moving to and from a bed to a chair (including a wheelchair)?: None Help needed standing up from a chair using your arms (e.g., wheelchair or bedside chair)?: A Little Help needed to walk in hospital room?: A Little Help needed climbing 3-5 steps  with a railing? : A Little 6 Click Score: 21    End of Session Equipment Utilized During Treatment: Gait belt Activity Tolerance: Other (comment)(Limited by nausea) Patient left: in chair;with call bell/phone within reach;with nursing/sitter in room Nurse Communication: Mobility status PT Visit Diagnosis: Other abnormalities of gait and mobility (R26.89)    Time: 8242-3536 PT Time Calculation (min) (ACUTE ONLY): 33 min   Charges:   PT Evaluation $PT Eval Moderate Complexity: 1 Mod PT Treatments $Self Care/Home Management: 8-22      Mabeline Caras, PT, DPT Acute Rehabilitation Services  Pager [phone] Office [place] 05/24/2018, 10:32 AM
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
## 2018-05-24 NOTE — Progress Notes (Signed)
Family Medicine Teaching Service Daily Progress Note Intern Pager: [phone]  Patient name: Emily [name] Medical record number: [id number] Date of birth: June 13, 1940 Age: 78 y.o. Gender: female  Primary Care Provider: Tsosie Billing, MD Consultants: cardiology Code Status: Full code  Pt Overview and Major Events to Date:  Hospital Day 0 Admitted: 05/23/2018   Assessment and Plan: Emily [name] is a 78 y.o. female presenting with two episodes of syncope this morning . PMH is significant for DM-II, anemia, HTN, GERD, carcinoid tumor.    Syncope without fall - cards consulted. Likely combination of vasovagal and orthostatic given drop in BP of 94mmHg upon standing.  Echo cancelled.  TSH normal. OT recommending [place] OT. Patient has been OOB multiple times today without syncopal episodes. No complaints of dizziness.   - AM orthostatic vitals.   - cards consulted, appreciate recs.   - f/u AM EKG - dietician consulted, appreciate recs - PT/OT eval  - morning BMP and CBC - 163ml/hr NS over 12 hrs. Expired today.      DM-II - patient states she is diet controlled for her diabetes.  Blood glucose was 108 on bmp.   - monitor morning CBGs  Vertigo - patient takes meclizine 12.5mg  at home. Has had this issue since june 2019.  - hold home meds  Anemia - patient has history of iron deficiency anemia. Hgb 11.4 on admission.  - morning CBC  HTN - patient was hypertensive in the ED. Systolic has been 147W to 190s.  At home she takes losartan 25mg . Cards increased dose to 50mg  daily  - continue losartan  - monitor BP     GERD -  Patient takes esomeprazole and carafate at home.  - start pantoprazole.  - holding carafate  Anxiety - patient takes paroxetine 20mg  at home - d/c paroxetine - start sertraline  IBS-D - patient states she has both diarrhea and constipation.  Patient takes Linzess 74mcg daily at home as well as bentyl 20mg  Bid.   - hold bentyl and linzess.     Glaucoma - patient takes travoprost at home at night as well as restasis BID.    - latanoprost nightly.  - continue restasis BID.   History of DVT - noted in previous notes.  On aspirin 81mg  at home.   - holding ASA currently.   Generalized abominal pain - patient has had several surgeries including 2 Nissen fundoplications, gastropexy, subtotal gastrectomy.  No acute pathology wseen on endosocpy . History of neuroendocrine tumor of the appendix, s/p surgical removal.   On magic mouthwash q6h PRN.  Uses lidocaine patches for MSK pain.  - holding magic mouthwash and lidocaine patches.  - phenergan PRN added back  FEN/GI: regular diet.  161ml/hr NS.   Prophylaxis: lovenox.    Disposition: home   Medications: Scheduled Meds: . cycloSPORINE  1 drop Both Eyes BID  . enoxaparin (LOVENOX) injection  40 mg Subcutaneous Q24H  . gabapentin  200 mg Oral BID  . latanoprost  1 drop Both Eyes QHS  . lidocaine  1 patch Transdermal Q24H  . losartan  50 mg Oral Daily  . pantoprazole  40 mg Oral Daily  . PARoxetine  20 mg Oral QPM  . sodium chloride flush  3 mL Intravenous Q12H   Continuous Infusions: . sodium chloride Stopped (05/23/18 1919)   PRN Meds: acetaminophen **OR** acetaminophen, meclizine  ================================================= ================================================= Subjective:  Patient feels good today.  Has had nausea, no vomiting.  Has had diarrhea.  Patient  would like to go home.    Objective: Temp:  [98.1 F (36.7 C)-98.4 F (36.9 C)] 98.4 F (36.9 C) (01/21 0432) Pulse Rate:  [57-107] 104 (01/21 0443) Resp:  [13-20] 16 (01/21 0432) BP: (139-197)/(62-93) 159/86 (01/21 0443) SpO2:  [96 %-100 %] 98 % (01/21 0442) Weight:  [59.6 kg-61.9 kg] 59.6 kg (01/21 0440) Intake/Output 01/20 0701 - 01/21 0700 In: 244.6 [P.O.:240; I.V.:4.6] Out: 500 [Urine:500] Physical Exam:  Gen: NAD, alert, non-toxic, well-nourished, well-appearing, sitting  comfortably  HEENT: no scleral icterus  CV: Regular rate and rhythm. 2+ radial pulses bilaterally. No murmurs.  Resp: Clear to auscultation bilaterally.  No wheezing, rales, abnormal lung sounds.  No increased work of breathing appreciated. Abd: tender in upper quandrants.  Normal bowel sounds.  Psych: Cooperative with exam. Pleasant. Makes eye contact. Extremities: Full ROM    Laboratory: Recent Labs  Lab 05/23/18 1406  WBC 6.3  HGB 11.4*  HCT 37.2  PLT 195   Recent Labs  Lab 05/23/18 1406  NA 139  K 4.3  CL 107  CO2 21*  BUN 9  CREATININE 0.86  CALCIUM 9.1  GLUCOSE 108*     Imaging/Diagnostic Tests: Ct Head Wo Contrast  Result Date: 05/23/2018 CLINICAL DATA:  Bilateral leg weakness and syncope today. EXAM: CT HEAD WITHOUT CONTRAST TECHNIQUE: Contiguous axial images were obtained from the base of the skull through the vertex without intravenous contrast. COMPARISON:  Brain MR dated 10/30/2017 and head CT dated 06/19/2017. FINDINGS: Brain: Mildly enlarged ventricles and cortical sulci. Mild patchy white matter low density in both cerebral hemispheres. No intracranial hemorrhage, mass lesion or CT evidence of acute infarction. Vascular: No hyperdense vessel or unexpected calcification. Skull: Stable circumscribed lucency in the clivus with a thin surrounding sclerotic rim. Otherwise, appearing skull. Sinuses/Orbits: Status post cataract extraction on the left. Unremarkable paranasal sinuses. Other: None. IMPRESSION: 1. No acute abnormality. 2. Mild diffuse cerebral and cerebellar atrophy. 3. Mild chronic small vessel white matter ischemic changes in both cerebral hemispheres. Electronically Signed   By: Claudie Revering M.D.   On: 05/23/2018 15:55      Benay Pike, MD 05/24/2018, 5:43 AM PGY-1, [place] Intern pager: [phone], text pages welcome

## 2018-05-26 NOTE — Telephone Encounter (Signed)
Yes, ok to refill.  Thanks! 

## 2018-05-31 ENCOUNTER — Other Ambulatory Visit: Payer: Medicare Other

## 2018-05-31 ENCOUNTER — Ambulatory Visit (INDEPENDENT_AMBULATORY_CARE_PROVIDER_SITE_OTHER): Payer: Medicare Other | Admitting: Gastroenterology

## 2018-05-31 ENCOUNTER — Encounter: Payer: Self-pay | Admitting: Gastroenterology

## 2018-05-31 VITALS — BP 124/58 | HR 88 | Ht 61.8 in | Wt 137.0 lb

## 2018-05-31 DIAGNOSIS — R1084 Generalized abdominal pain: Secondary | ICD-10-CM

## 2018-05-31 DIAGNOSIS — R110 Nausea: Secondary | ICD-10-CM | POA: Diagnosis not present

## 2018-05-31 DIAGNOSIS — R197 Diarrhea, unspecified: Secondary | ICD-10-CM

## 2018-05-31 DIAGNOSIS — R1310 Dysphagia, unspecified: Secondary | ICD-10-CM

## 2018-05-31 DIAGNOSIS — K219 Gastro-esophageal reflux disease without esophagitis: Secondary | ICD-10-CM | POA: Diagnosis not present

## 2018-05-31 MED ORDER — MAGIC MOUTHWASH: 5.0000 mL | Freq: Four times a day (QID) | ORAL | 1 refills | Status: DC

## 2018-05-31 MED ORDER — PANCRELIPASE (LIP-PROT-AMYL) 25000-79000 UNITS PO CPEP: 2.0000 | ORAL_CAPSULE | Freq: Three times a day (TID) | ORAL | 3 refills | Status: DC
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
## 2018-05-31 NOTE — Patient Instructions (Signed)
Go to the basement for labs today  We have sent magic Mouthwash to [facility] as requested  We have sent Zenpep to [facility]  If you are age 79 or older, your body mass index should be between 23-30. Your Body mass index is 25.22 kg/m. If this is out of the aforementioned range listed, please consider follow up with your Primary Care Provider.  If you are age 35 or younger, your body mass index should be between 19-25. Your Body mass index is 25.22 kg/m. If this is out of the aformentioned range listed, please consider follow up with your Primary Care Provider.

## 2018-06-01 ENCOUNTER — Encounter: Payer: Self-pay | Admitting: Podiatry

## 2018-06-01 ENCOUNTER — Ambulatory Visit (INDEPENDENT_AMBULATORY_CARE_PROVIDER_SITE_OTHER): Payer: Medicare Other | Admitting: Podiatry

## 2018-06-01 DIAGNOSIS — B351 Tinea unguium: Secondary | ICD-10-CM

## 2018-06-01 DIAGNOSIS — E0842 Diabetes mellitus due to underlying condition with diabetic polyneuropathy: Secondary | ICD-10-CM

## 2018-06-01 DIAGNOSIS — M79676 Pain in unspecified toe(s): Secondary | ICD-10-CM

## 2018-06-01 NOTE — Progress Notes (Signed)
Complaint:  Visit Type: Patient returns to my office for continued preventative foot care services. Complaint: Patient states" my nails have grown long and thick and become painful to walk and wear shoes" Patient has been diagnosed with DM with neuropathy.. The patient presents for preventative foot care services. No changes to ROS  Podiatric Exam: Vascular: dorsalis pedis and posterior tibial pulses are palpable bilateral. Capillary return is immediate. Temperature gradient is WNL. Skin turgor WNL  Sensorium: Diminished  Semmes Weinstein monofilament test. Normal tactile sensation bilaterally. Nail Exam: Pt has thick disfigured discolored nails with subungual debris noted bilateral entire nail hallux through fifth toenails Ulcer Exam: There is no evidence of ulcer or pre-ulcerative changes or infection. Orthopedic Exam: Muscle tone and strength are WNL. No limitations in general ROM. No crepitus or effusions noted. Foot type and digits show no abnormalities. Bony prominences are unremarkable. Skin: No Porokeratosis. No infection or ulcers  Diagnosis:  Onychomycosis, , Pain in right toe, pain in left toes  Treatment & Plan Procedures and Treatment: Consent by patient was obtained for treatment procedures.   Debridement of mycotic and hypertrophic toenails, 1 through 5 bilateral and clearing of subungual debris. No ulceration, no infection noted.  Return Visit-Office Procedure: Patient instructed to return to the office for a follow up visit 10 weeks  for continued evaluation and treatment.    Dajai Wahlert DPM 

## 2018-06-02 ENCOUNTER — Telehealth: Payer: Self-pay | Admitting: Gastroenterology

## 2018-06-02 NOTE — Telephone Encounter (Signed)
Pt called to inform that magic mouth wash is still expensive. She stated that Dr. Silverio Decamp told her to let us know if that was the case.

## 2018-06-02 NOTE — Telephone Encounter (Signed)
Dr Nandigam please advise 

## 2018-06-02 NOTE — Telephone Encounter (Signed)
Please check if we can send separate prescriptions for each instead of compounding it.  Thank you

## 2018-06-03 NOTE — Telephone Encounter (Signed)
Left message for [place] to return my call

## 2018-06-03 NOTE — Telephone Encounter (Signed)
[facility] returned my call, they will run the prices on the ingredients and call the patient and inform her before they fill it.

## 2018-06-06 ENCOUNTER — Telehealth: Payer: Self-pay | Admitting: Gastroenterology

## 2018-06-06 NOTE — Telephone Encounter (Signed)
Pt called and stated that the medication Pancrelipase, Lip-Prot-Amyl, (ZENPEP) 25000-79000 units CPEP [[id number]]  Is causing her a lot of diarrhea.

## 2018-06-06 NOTE — Telephone Encounter (Signed)
Please advise patient to continue taking Zenpep with every meal.  Check GI pathogen panel. Advised patient to swallow 5 cc of GI cocktail every 6-8 hours as needed.  Thank you

## 2018-06-06 NOTE — Telephone Encounter (Signed)
1)Patient took the Zenpep and had loose stools. She says this occurred 90 minutes after taking it. She read the literature given at the pharmacy and has found where it says to call the doctor immediately if she develops diarrhea. Discussed that she was having diarrhea before the Zenpep. She has not submitted a stool specimen.  2)The pharmacy gave her "GI Cocktail" instead of Magic Mouthwash. Confirmed with the pharmacy that they called it "GI Cocktail"  and that it is the ingredients given to them by [facility]. Is she needing to swish and swallow with this?

## 2018-06-06 NOTE — Telephone Encounter (Signed)
Discussed with the patient. She agrees to this plan and will continue her medications.

## 2018-06-08 ENCOUNTER — Other Ambulatory Visit: Payer: Medicare Other

## 2018-06-08 DIAGNOSIS — R110 Nausea: Secondary | ICD-10-CM

## 2018-06-08 DIAGNOSIS — R197 Diarrhea, unspecified: Secondary | ICD-10-CM

## 2018-06-08 DIAGNOSIS — R1084 Generalized abdominal pain: Secondary | ICD-10-CM

## 2018-06-09 LAB — GASTROINTESTINAL PATHOGEN PANEL PCR
C. difficile Tox A/B, PCR: NOT DETECTED
Campylobacter, PCR: NOT DETECTED
Cryptosporidium, PCR: NOT DETECTED
E coli (ETEC) LT/ST PCR: NOT DETECTED
E coli (STEC) stx1/stx2, PCR: NOT DETECTED
E coli 0157, PCR: NOT DETECTED
Giardia lamblia, PCR: NOT DETECTED
Norovirus, PCR: NOT DETECTED
Rotavirus A, PCR: NOT DETECTED
Salmonella, PCR: NOT DETECTED
Shigella, PCR: NOT DETECTED

## 2018-06-13 ENCOUNTER — Other Ambulatory Visit: Payer: Self-pay | Admitting: Gastroenterology

## 2018-06-15 DIAGNOSIS — H2511 Age-related nuclear cataract, right eye: Secondary | ICD-10-CM | POA: Insufficient documentation

## 2018-06-15 DIAGNOSIS — H401134 Primary open-angle glaucoma, bilateral, indeterminate stage: Secondary | ICD-10-CM | POA: Insufficient documentation

## 2018-06-20 ENCOUNTER — Other Ambulatory Visit: Payer: Self-pay | Admitting: Gastroenterology

## 2018-06-22 ENCOUNTER — Other Ambulatory Visit: Payer: Self-pay | Admitting: Hematology

## 2018-06-22 DIAGNOSIS — C7A8 Other malignant neuroendocrine tumors: Secondary | ICD-10-CM

## 2018-07-13 ENCOUNTER — Telehealth (INDEPENDENT_AMBULATORY_CARE_PROVIDER_SITE_OTHER): Payer: Self-pay | Admitting: Physical Medicine and Rehabilitation

## 2018-07-13 NOTE — Telephone Encounter (Signed)
ok 

## 2018-07-14 NOTE — Telephone Encounter (Signed)
Notification or Prior Authorization is not required for the requested services  This [employer]vant[employer] members plan does not currently require a prior authorization for [id number]  Decision ID #:[id number]

## 2018-07-14 NOTE — Telephone Encounter (Signed)
Is auth needed for bilateral [phone]? Patient scheduled for 4/1.

## 2018-07-18 ENCOUNTER — Other Ambulatory Visit: Payer: Self-pay | Admitting: Gastroenterology

## 2018-07-19 ENCOUNTER — Other Ambulatory Visit: Payer: Self-pay | Admitting: Gastroenterology

## 2018-07-25 ENCOUNTER — Ambulatory Visit
Admission: RE | Admit: 2018-07-25 | Discharge: 2018-07-25 | Disposition: A | Discharge status: Home / Self Care | Payer: Medicare Other | Source: Ambulatory Visit | Attending: Family | Admitting: Family

## 2018-07-25 ENCOUNTER — Other Ambulatory Visit: Payer: Self-pay

## 2018-07-25 ENCOUNTER — Ambulatory Visit: Payer: Medicare Other

## 2018-07-25 DIAGNOSIS — N6489 Other specified disorders of breast: Secondary | ICD-10-CM

## 2018-08-03 ENCOUNTER — Encounter (INDEPENDENT_AMBULATORY_CARE_PROVIDER_SITE_OTHER): Payer: Medicare Other | Admitting: Physical Medicine and Rehabilitation

## 2018-08-17 ENCOUNTER — Other Ambulatory Visit: Payer: Self-pay | Admitting: Gastroenterology

## 2018-08-24 ENCOUNTER — Telehealth: Payer: Self-pay | Admitting: Hematology

## 2018-08-24 NOTE — Telephone Encounter (Signed)
R/s appt per 4/21 sch message - pt is aware of appt date and time and that she will be called for the follow up .

## 2018-08-26 ENCOUNTER — Telehealth: Payer: Self-pay | Admitting: Gastroenterology

## 2018-08-26 NOTE — Telephone Encounter (Signed)
Patient called said she is having extreme abd pain and losing apatite would like to know what to do.

## 2018-08-26 NOTE — Telephone Encounter (Signed)
Patient calls with complaints of abdominal pain. She says it responds sometimes to her lidoderm patches. The pain is located in the area of her incisional scar. She has decrease of her appetite. Taking her phenergan for nausea without vomiting. Able to retain her food. "Not hungry. I eat once a day." This concerns here.  Any suggestions?

## 2018-08-29 ENCOUNTER — Encounter (INDEPENDENT_AMBULATORY_CARE_PROVIDER_SITE_OTHER): Payer: Medicare Other | Admitting: Physical Medicine and Rehabilitation
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
## 2018-08-29 NOTE — Telephone Encounter (Signed)
Schedule her for televisit, next available. Thanks

## 2018-08-29 NOTE — Telephone Encounter (Signed)
Spoke with the patient. (woke her up from sleeping) She has a CT this week ordered by Dr Burr Medico. She will follow up by phone with Dr Burr Medico on 09/06/18. She will call us after this if she has any GI issues to discuss.

## 2018-08-30 ENCOUNTER — Encounter: Payer: Self-pay | Admitting: Podiatry

## 2018-08-30 ENCOUNTER — Ambulatory Visit (INDEPENDENT_AMBULATORY_CARE_PROVIDER_SITE_OTHER): Payer: Medicare Other | Admitting: Podiatry

## 2018-08-30 ENCOUNTER — Other Ambulatory Visit: Payer: Self-pay

## 2018-08-30 VITALS — Temp 97.5°F

## 2018-08-30 DIAGNOSIS — E0842 Diabetes mellitus due to underlying condition with diabetic polyneuropathy: Secondary | ICD-10-CM | POA: Diagnosis not present

## 2018-08-30 DIAGNOSIS — M79676 Pain in unspecified toe(s): Secondary | ICD-10-CM | POA: Diagnosis not present

## 2018-08-30 DIAGNOSIS — B351 Tinea unguium: Secondary | ICD-10-CM

## 2018-08-30 NOTE — Progress Notes (Signed)
Complaint:  Visit Type: Patient returns to my office for continued preventative foot care services. Complaint: Patient states" my nails have grown long and thick and become painful to walk and wear shoes" Patient has been diagnosed with DM with neuropathy.. The patient presents for preventative foot care services. No changes to ROS  Podiatric Exam: Vascular: dorsalis pedis and posterior tibial pulses are palpable bilateral. Capillary return is immediate. Temperature gradient is WNL. Skin turgor WNL  Sensorium: Diminished  Semmes Weinstein monofilament test. Normal tactile sensation bilaterally. Nail Exam: Pt has thick disfigured discolored nails with subungual debris noted bilateral entire nail hallux through fifth toenails Ulcer Exam: There is no evidence of ulcer or pre-ulcerative changes or infection. Orthopedic Exam: Muscle tone and strength are WNL. No limitations in general ROM. No crepitus or effusions noted. Foot type and digits show no abnormalities. Bony prominences are unremarkable. Skin: No Porokeratosis. No infection or ulcers  Diagnosis:  Onychomycosis, , Pain in right toe, pain in left toes  Treatment & Plan Procedures and Treatment: Consent by patient was obtained for treatment procedures.   Debridement of mycotic and hypertrophic toenails, 1 through 5 bilateral and clearing of subungual debris. No ulceration, no infection noted.  Return Visit-Office Procedure: Patient instructed to return to the office for a follow up visit 10 weeks  for continued evaluation and treatment.    Gardiner Barefoot DPM

## 2018-08-31 ENCOUNTER — Inpatient Hospital Stay: Payer: Medicare Other | Attending: Hematology

## 2018-08-31 ENCOUNTER — Ambulatory Visit (HOSPITAL_COMMUNITY): Payer: Medicare Other

## 2018-08-31 ENCOUNTER — Other Ambulatory Visit: Payer: Self-pay

## 2018-08-31 ENCOUNTER — Ambulatory Visit (HOSPITAL_COMMUNITY): Admission: RE | Admit: 2018-08-31 | Payer: Medicare Other | Source: Ambulatory Visit

## 2018-08-31 ENCOUNTER — Ambulatory Visit (HOSPITAL_COMMUNITY)
Admission: RE | Admit: 2018-08-31 | Discharge: 2018-08-31 | Disposition: A | Discharge status: Home / Self Care | Payer: Medicare Other | Source: Ambulatory Visit | Attending: Hematology | Admitting: Hematology

## 2018-08-31 DIAGNOSIS — C7A8 Other malignant neuroendocrine tumors: Secondary | ICD-10-CM | POA: Insufficient documentation

## 2018-08-31 DIAGNOSIS — D519 Vitamin B12 deficiency anemia, unspecified: Secondary | ICD-10-CM

## 2018-08-31 LAB — VITAMIN B12: Vitamin B-12: 220 pg/mL (ref 180–914)

## 2018-08-31 LAB — CBC WITH DIFFERENTIAL/PLATELET
Abs Immature Granulocytes: 0.02 10*3/uL (ref 0.00–0.07)
Basophils Absolute: 0 10*3/uL (ref 0.0–0.1)
Basophils Relative: 1 %
Eosinophils Absolute: 0.1 10*3/uL (ref 0.0–0.5)
Eosinophils Relative: 1 %
HCT: 35.6 % — ABNORMAL LOW (ref 36.0–46.0)
Hemoglobin: 11 g/dL — ABNORMAL LOW (ref 12.0–15.0)
Immature Granulocytes: 0 %
Lymphocytes Relative: 27 %
Lymphs Abs: 2 10*3/uL (ref 0.7–4.0)
MCH: 24.1 pg — ABNORMAL LOW (ref 26.0–34.0)
MCHC: 30.9 g/dL (ref 30.0–36.0)
MCV: 78.1 fL — ABNORMAL LOW (ref 80.0–100.0)
Monocytes Absolute: 0.6 10*3/uL (ref 0.1–1.0)
Monocytes Relative: 8 %
Neutro Abs: 4.9 10*3/uL (ref 1.7–7.7)
Neutrophils Relative %: 63 %
Platelets: 183 10*3/uL (ref 150–400)
RBC: 4.56 MIL/uL (ref 3.87–5.11)
RDW: 14.6 % (ref 11.5–15.5)
WBC: 7.6 10*3/uL (ref 4.0–10.5)
nRBC: 0 % (ref 0.0–0.2)

## 2018-08-31 LAB — COMPREHENSIVE METABOLIC PANEL
ALT: 32 U/L (ref 0–44)
AST: 47 U/L — ABNORMAL HIGH (ref 15–41)
Albumin: 3.8 g/dL (ref 3.5–5.0)
Alkaline Phosphatase: 87 U/L (ref 38–126)
Anion gap: 6 (ref 5–15)
BUN: 17 mg/dL (ref 8–23)
CO2: 19 mmol/L — ABNORMAL LOW (ref 22–32)
Calcium: 8.7 mg/dL — ABNORMAL LOW (ref 8.9–10.3)
Chloride: 111 mmol/L (ref 98–111)
Creatinine, Ser: 0.94 mg/dL (ref 0.44–1.00)
GFR calc Af Amer: >60 mL/min (ref >60)
GFR calc non Af Amer: 58 mL/min — ABNORMAL LOW (ref >60)
Glucose, Bld: 96 mg/dL (ref 70–99)
Potassium: 5.8 mmol/L — ABNORMAL HIGH (ref 3.5–5.1)
Sodium: 136 mmol/L (ref 135–145)
Total Bilirubin: 0.3 mg/dL (ref 0.3–1.2)
Total Protein: 6.7 g/dL (ref 6.5–8.1)

## 2018-08-31 MED ORDER — IOHEXOL 300 MG/ML  SOLN
75.0000 mL | Freq: Once | INTRAMUSCULAR | Status: AC | PRN
  Administered 2018-08-31: 75 mL via INTRAVENOUS

## 2018-08-31 MED ORDER — SODIUM CHLORIDE (PF) 0.9 % IJ SOLN
INTRAMUSCULAR | Status: AC
  Filled 2018-08-31: qty 50

## 2018-09-01 ENCOUNTER — Telehealth: Payer: Self-pay

## 2018-09-01 LAB — INTRINSIC FACTOR ANTIBODIES: Intrinsic Factor: 1 AU/mL (ref 0.0–1.1)

## 2018-09-01 LAB — CHROMOGRANIN A: Chromogranin A (ng/mL): 52.7 ng/mL (ref 0.0–101.8)

## 2018-09-01 NOTE — Telephone Encounter (Signed)
-----   Message from Truitt Merle, MD sent at 09/01/2018 12:30 PM EDT ----- Please let pt know her K was high, stop any KCL supplement if she is on, and avoid K-rich food. I am see her next week will repeat her lab in future, thanks   Truitt Merle  09/01/2018

## 2018-09-01 NOTE — Telephone Encounter (Signed)
Spoke with patient regarding lab results.  Per Dr. Burr Medico potassium is high, instructed her to stop the KCL supplement and to avoid potassium rich foods.  Will repeat lab in the future.  The patient verbalized an understanding.

## 2018-09-02 LAB — METHYLMALONIC ACID, SERUM: Methylmalonic Acid, Quantitative: 675 nmol/L — ABNORMAL HIGH (ref 0–378)
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
## 2018-09-06 ENCOUNTER — Inpatient Hospital Stay: Payer: Medicare Other | Attending: Hematology | Admitting: Hematology

## 2018-09-06 ENCOUNTER — Telehealth: Payer: Self-pay | Admitting: Hematology

## 2018-09-06 ENCOUNTER — Encounter: Payer: Self-pay | Admitting: Hematology

## 2018-09-06 DIAGNOSIS — C7A8 Other malignant neuroendocrine tumors: Secondary | ICD-10-CM

## 2018-09-06 DIAGNOSIS — F17210 Nicotine dependence, cigarettes, uncomplicated: Secondary | ICD-10-CM

## 2018-09-06 DIAGNOSIS — D519 Vitamin B12 deficiency anemia, unspecified: Secondary | ICD-10-CM

## 2018-09-06 DIAGNOSIS — Z9884 Bariatric surgery status: Secondary | ICD-10-CM

## 2018-09-06 DIAGNOSIS — R109 Unspecified abdominal pain: Secondary | ICD-10-CM | POA: Diagnosis not present

## 2018-09-06 DIAGNOSIS — K219 Gastro-esophageal reflux disease without esophagitis: Secondary | ICD-10-CM

## 2018-09-06 DIAGNOSIS — E875 Hyperkalemia: Secondary | ICD-10-CM

## 2018-09-06 NOTE — Telephone Encounter (Signed)
Scheduled appt per 5/5 los. ° °A calendar will be mailed out. °

## 2018-09-07 ENCOUNTER — Ambulatory Visit: Payer: Medicare Other | Admitting: Hematology

## 2018-09-16 ENCOUNTER — Telehealth: Payer: Self-pay | Admitting: Emergency Medicine

## 2018-09-16 MED ORDER — CYANOCOBALAMIN 1000 MCG/ML IJ SOLN: 1000.0000 ug | INTRAMUSCULAR | 0 refills | Status: DC

## 2018-09-16 NOTE — Telephone Encounter (Signed)
Returning pt's VM asking if she is to continue with B12 shots.  Per Md Smiley pt to continue and increase to 1 shot q2wks along with oral iron.  Advised to take imodium for mild loose stools with iron pills.  Refill sent to [facility] per pt request.  Pt VU.

## 2018-09-18 IMAGING — CT CT ABD-PELV W/O CM
2 of 4 series · 16 of 46 positions shown, 18 images · non-contrast
Comparison: 08/21/2016, 07/23/2015

CLINICAL DATA: Generalized abdominal pain

EXAM:
CT ABDOMEN AND PELVIS WITHOUT CONTRAST
TECHNIQUE: Multidetector CT imaging of the abdomen and pelvis was performed
following the standard protocol without IV contrast.

[Series 2: axial st · axial · 0.82mm/px · z∈[+1108,+1478]mm · 13 of 84 slices shown, 15 images]
[im 5/84  soft-tissue]
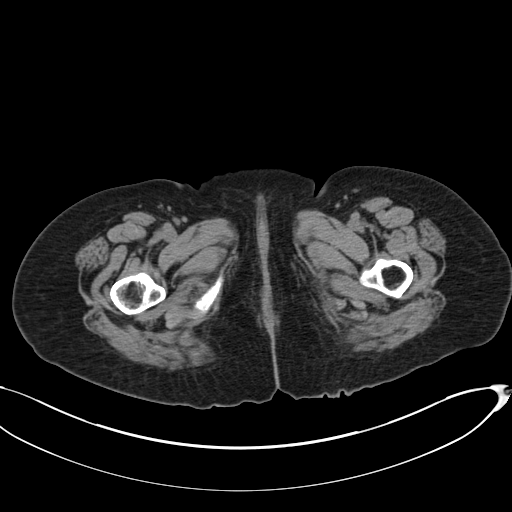
[im 5/84  bone]
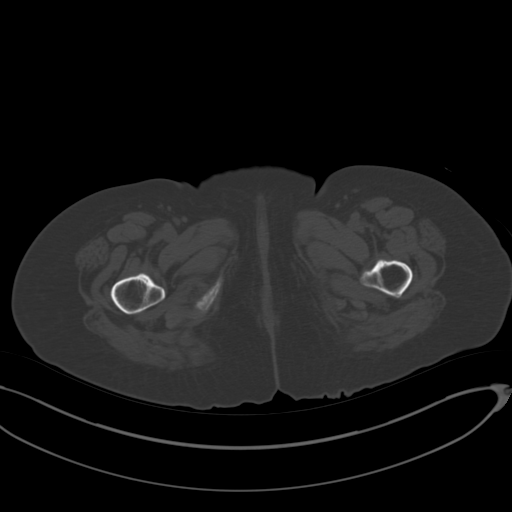
[im 14/84  soft-tissue]
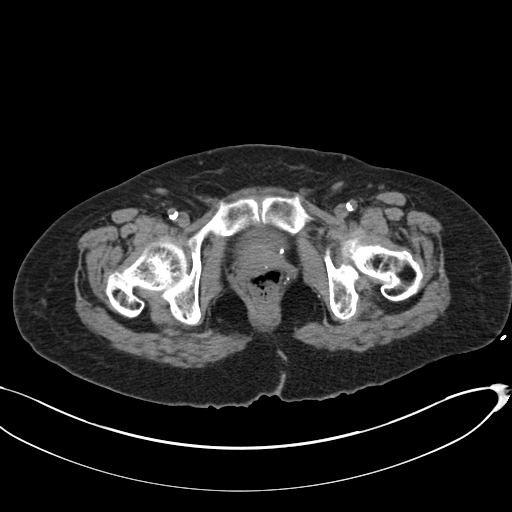
[im 18/84  soft-tissue]
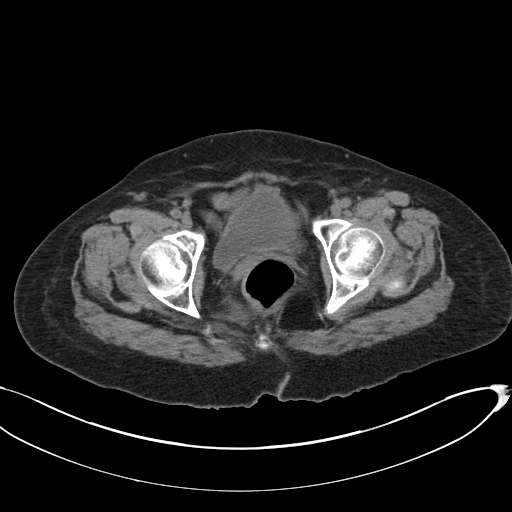
[im 22/84  soft-tissue]
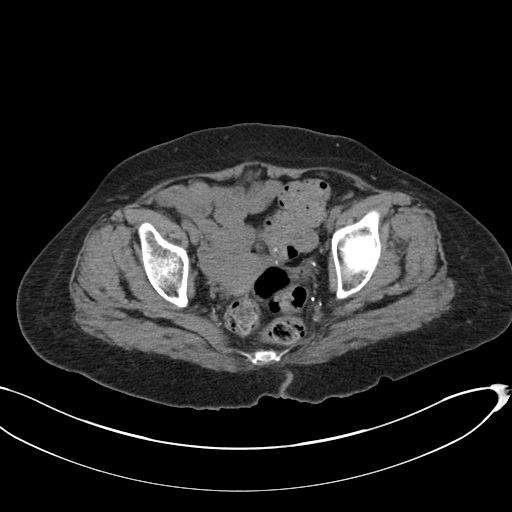
[im 31/84  soft-tissue]
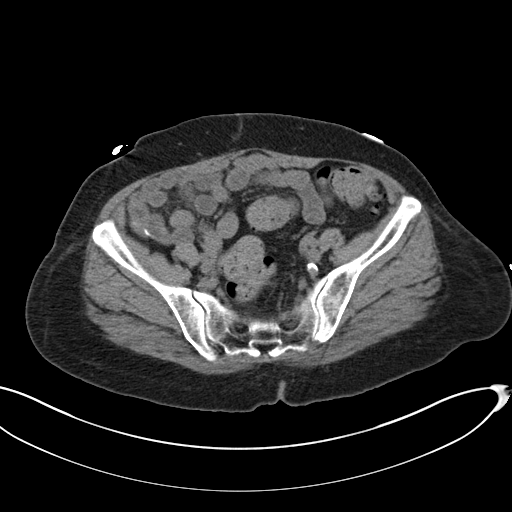
[im 35/84  soft-tissue]
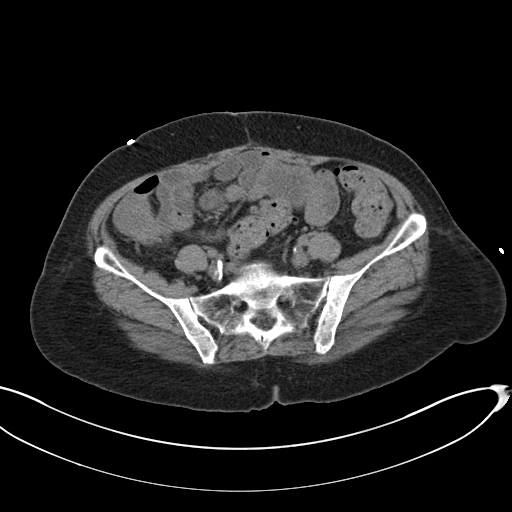
[im 44/84  soft-tissue]
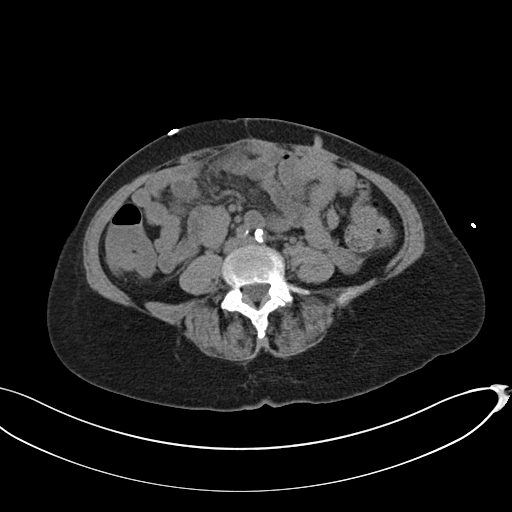
[im 49/84  soft-tissue]
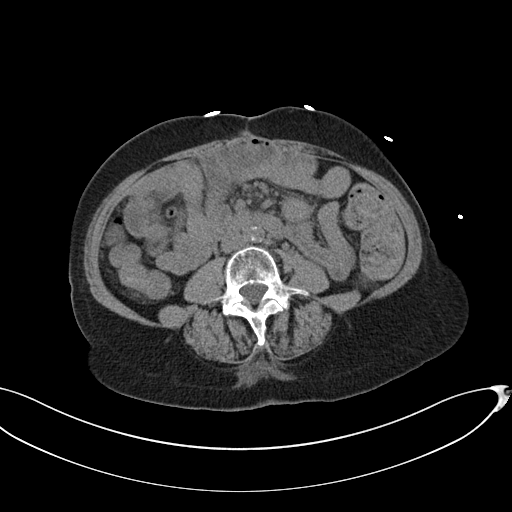
[im 53/84  soft-tissue]
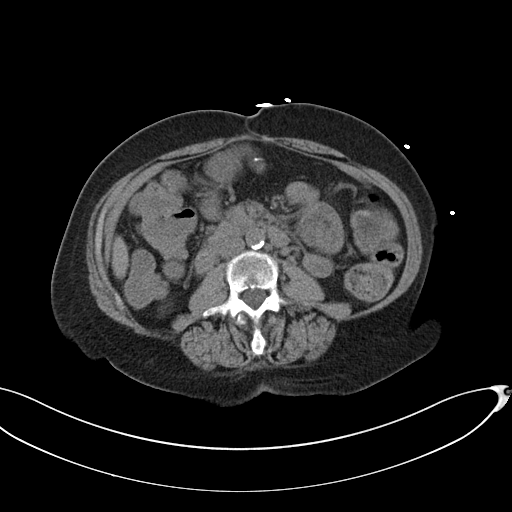
[im 53/84  bone]
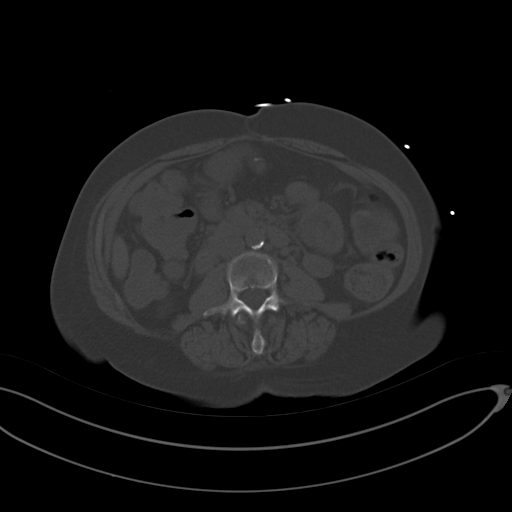
[im 62/84  soft-tissue]
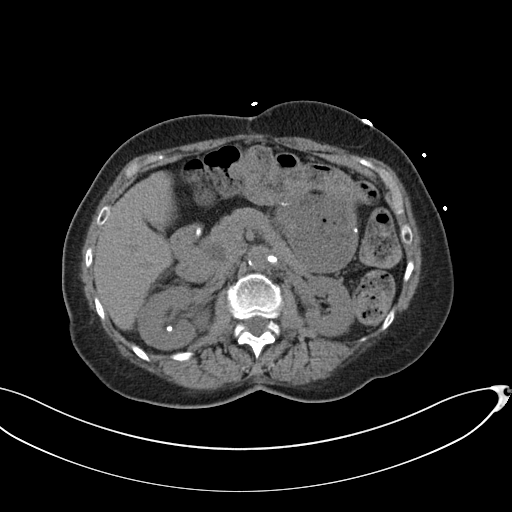
[im 66/84  soft-tissue]
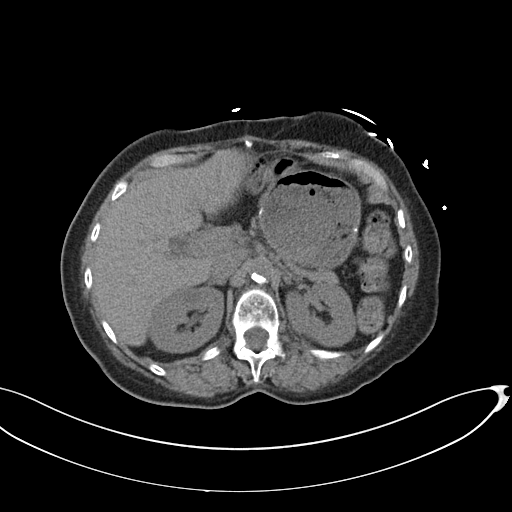
[im 70/84  soft-tissue]
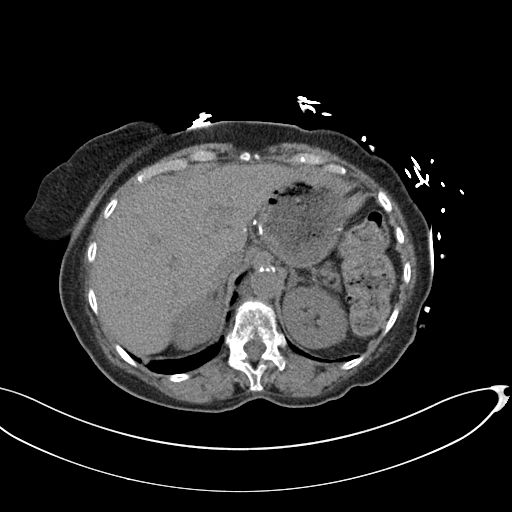
[im 79/84  soft-tissue]
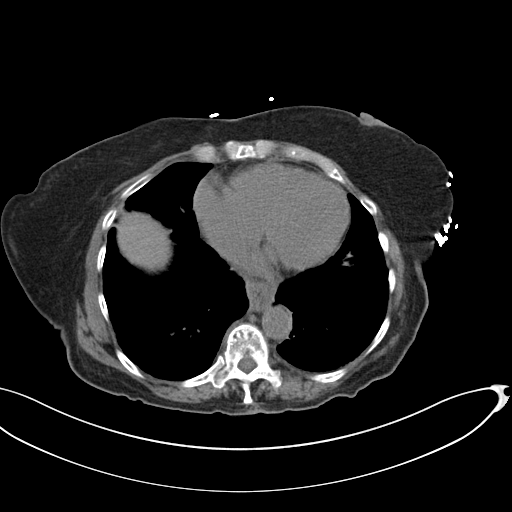

[Series 5: coronal st · coronal · 0.77mm/px · 3 of 89 slices shown]
[im 30/89  soft-tissue]
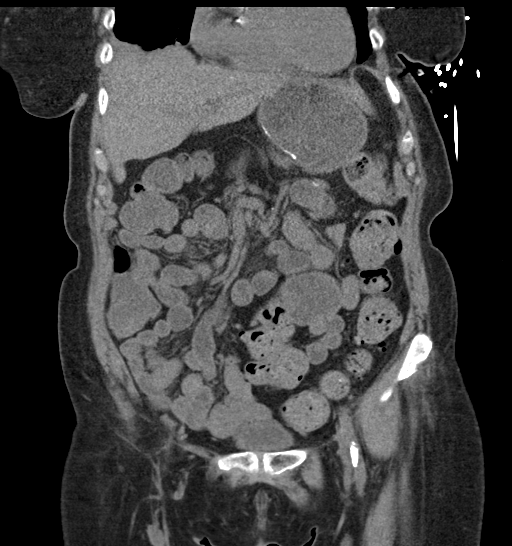
[im 40/89  soft-tissue]
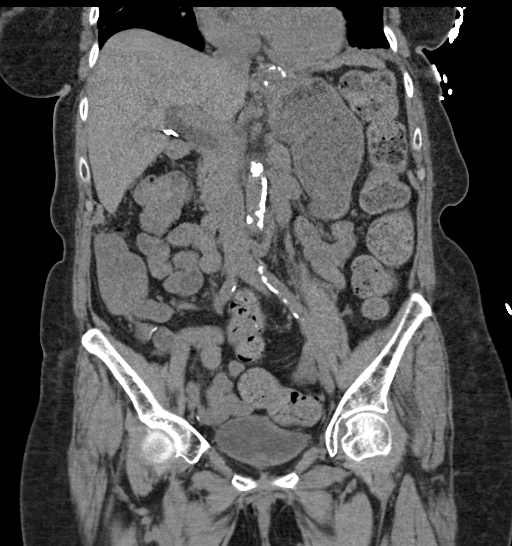
[im 49/89  soft-tissue]
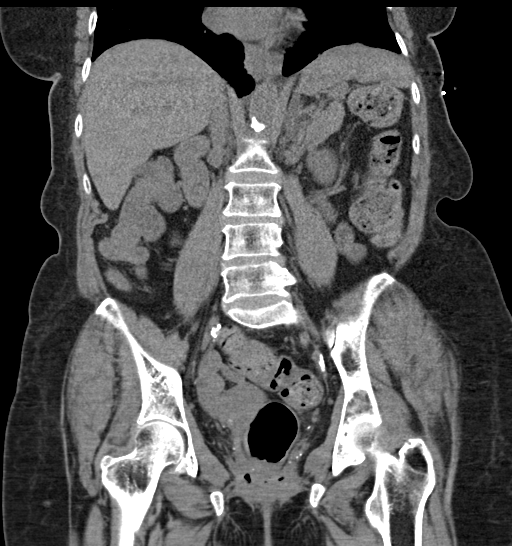

[16 of 46 positions shown; findings below may reference images not displayed]

FINDINGS: Lower chest: Lung bases demonstrate no acute consolidation or
effusion. Linear scarring or atelectasis at the lung bases. Possible
distal esophageal circumferential thickening.

Hepatobiliary: No focal hepatic abnormality. Surgical clips at the
gallbladder fossa. Stable enlarged extrahepatic bile duct, likely
due to surgical change

Pancreas: Unremarkable. No pancreatic ductal dilatation or
surrounding inflammatory changes.

Spleen: Normal in size without focal abnormality.

Adrenals/Urinary Tract: Adrenal glands are within normal limits. No
hydronephrosis. Punctate nonobstructing stone in the lower pole of
the left kidney. Cyst in the upper pole of the right kidney.
Multiple nonobstructing stones in the mid to upper pole of the right
kidney measuring up to 3 mm in size. The bladder is unremarkable

Stomach/Bowel: The patient has a history of prior gastrectomy with
esophagojejunostomy. There is dilation of the bowel just distal to
the esophageal anastomosis. A dilated loop of small bowel in the
left lower quadrant is seen on previous exams. Status post
appendectomy. No colon wall thickening. Scattered colon diverticula
without acute inflammation

Vascular/Lymphatic: Aortic atherosclerosis. No aneurysmal
dilatation. No significantly enlarged lymph nodes.

Reproductive: Uterus and bilateral adnexa are unremarkable.

Other: Negative for free air or free fluid. Small in the inguinal
canals.

Musculoskeletal: Degenerative changes. No acute or suspicious bone
lesion.
IMPRESSION: 1. Possible circumferential distal esophageal thickening which may
be secondary to esophagitis. The patient is status post gastrectomy
with esophagojejunostomy. There is dilation of the segment of bowel
in the upper abdomen which is anastomosed to the distal esophagus,
not certain if this is due to fluid distention (?recent meal) or
obstruction. Follow-up exam with enteral contrast could be obtained
to evaluate for patency.
2. Nonobstructing stones within both kidneys.
3. Colon diverticula without acute inflammation.

## 2018-09-21 ENCOUNTER — Ambulatory Visit (INDEPENDENT_AMBULATORY_CARE_PROVIDER_SITE_OTHER): Payer: Medicare Other | Admitting: Physical Medicine and Rehabilitation

## 2018-09-21 ENCOUNTER — Ambulatory Visit: Payer: Self-pay

## 2018-09-21 ENCOUNTER — Other Ambulatory Visit: Payer: Self-pay

## 2018-09-21 ENCOUNTER — Encounter: Payer: Self-pay | Admitting: Physical Medicine and Rehabilitation

## 2018-09-21 VITALS — BP 107/57 | HR 78

## 2018-09-21 DIAGNOSIS — M546 Pain in thoracic spine: Secondary | ICD-10-CM | POA: Diagnosis not present

## 2018-09-21 DIAGNOSIS — M5116 Intervertebral disc disorders with radiculopathy, lumbar region: Secondary | ICD-10-CM

## 2018-09-21 DIAGNOSIS — M47816 Spondylosis without myelopathy or radiculopathy, lumbar region: Secondary | ICD-10-CM

## 2018-09-21 DIAGNOSIS — G8929 Other chronic pain: Secondary | ICD-10-CM

## 2018-09-21 DIAGNOSIS — M5416 Radiculopathy, lumbar region: Secondary | ICD-10-CM

## 2018-09-21 DIAGNOSIS — M7918 Myalgia, other site: Secondary | ICD-10-CM | POA: Diagnosis not present

## 2018-09-21 MED ORDER — BETAMETHASONE SOD PHOS & ACET 6 (3-3) MG/ML IJ SUSP
12.0000 mg | Freq: Once | INTRAMUSCULAR | Status: AC
  Administered 2018-09-21: 10:00:00 12 mg

## 2018-09-21 NOTE — Progress Notes (Signed)
.  Numeric Pain Rating Scale and Functional Assessment Average Pain 10   In the last MONTH (on 0-10 scale) has pain interfered with the following?  1. General activity like being  able to carry out your everyday physical activities such as walking, climbing stairs, carrying groceries, or moving a chair?  Rating(9)   +Driver, -BT, -Dye Allergies.  

## 2018-09-21 NOTE — Procedures (Signed)
Lumbosacral Transforaminal Epidural Steroid Injection - Sub-Pedicular Approach with Fluoroscopic Guidance  Patient: Emily [name]      Date of Birth: 1940-09-24 MRN: [id number] PCP: Sonia Side., FNP      Visit Date: 09/21/2018   Universal Protocol:    Date/Time: 09/21/2018  Consent Given By: the patient  Position: PRONE  Additional Comments: Vital signs were monitored before and after the procedure. Patient was prepped and draped in the usual sterile fashion. The correct patient, procedure, and site was verified.   Injection Procedure Details:  Procedure Site One Meds Administered:  Meds ordered this encounter  Medications  . betamethasone acetate-betamethasone sodium phosphate (CELESTONE) injection 12 mg    Laterality: Bilateral  Location/Site:  L5-S1  Needle size: 22 G  Needle type: Spinal  Needle Placement: Transforaminal  Findings:    -Comments: Excellent flow of contrast along the nerve and into the epidural space.  Procedure Details: After squaring off the end-plates to get a true AP view, the C-arm was positioned so that an oblique view of the foramen as noted above was visualized. The target area is just inferior to the "nose of the scotty dog" or sub pedicular. The soft tissues overlying this structure were infiltrated with 2-3 ml. of 1% Lidocaine without Epinephrine.  The spinal needle was inserted toward the target using a "trajectory" view along the fluoroscope beam.  Under AP and lateral visualization, the needle was advanced so it did not puncture dura and was located close the 6 O'Clock position of the pedical in AP tracterory. Biplanar projections were used to confirm position. Aspiration was confirmed to be negative for CSF and/or blood. A 1-2 ml. volume of Isovue-250 was injected and flow of contrast was noted at each level. Radiographs were obtained for documentation purposes.   After attaining the desired flow of contrast documented above, a  0.5 to 1.0 ml test dose of 0.25% Marcaine was injected into each respective transforaminal space.  The patient was observed for 90 seconds post injection.  After no sensory deficits were reported, and normal lower extremity motor function was noted,   the above injectate was administered so that equal amounts of the injectate were placed at each foramen (level) into the transforaminal epidural space.   Additional Comments:  The patient tolerated the procedure well Dressing: 2 x 2 sterile gauze and Band-Aid    Post-procedure details: Patient was observed during the procedure. Post-procedure instructions were reviewed.  Patient left the clinic in stable condition.
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
## 2018-09-22 ENCOUNTER — Telehealth: Payer: Self-pay | Admitting: Gastroenterology

## 2018-09-22 ENCOUNTER — Other Ambulatory Visit: Payer: Self-pay | Admitting: Gastroenterology

## 2018-09-22 NOTE — Telephone Encounter (Signed)
Dustin Folks, PA requested a STAT appt for pt for "serious GI issues."

## 2018-09-23 ENCOUNTER — Other Ambulatory Visit: Payer: Self-pay | Admitting: Gastroenterology

## 2018-09-23 NOTE — Telephone Encounter (Signed)
appt made for 09/27/18 at 845 am with Dr Silverio Decamp.

## 2018-09-23 NOTE — Telephone Encounter (Signed)
Dr Lyndel Safe this is a patient of Dr Silverio Decamp can you please review. I am not sure the pt should wait for Dr Woodward Ku next available.

## 2018-09-23 NOTE — Telephone Encounter (Signed)
Have discussed with the patient in detail over the phone. Has had  -generalized abdominal pain, Lidoderm patches were not working.  Started on tramadol with some relief.   -Also with chronic nausea.  Wanted prescription of promethazine to be called in.  Has been having decreased appetite. Has been taking 25 mg every 6 hours.  Gabapentin has been stopped. -Has H/O diarrhea but lately has been having more constipation. "Have bad IBS".  No fever or chills.  Has stopped his Zenpep.  Took stool softeners 3 today but no results yet.  Plan: -Promethazine 25 mg 1 tab po q6hrs prn #30. 2 refills. Fall precautions. -MiraLAX 17 g p.o. once a day (starting tomorrow).  Stop if she starts having diarrhea. -Increase water intake. -Can continue tramadol as needed. -Follow-up tele-visit next week with Dr. Silverio Decamp.  Thanks  Merrie Roof

## 2018-09-23 NOTE — Telephone Encounter (Signed)
PA called again to follow up on this request, he stated that someone called his office this morning asking for more information about pt. He stated that pt is experiencing decrease appetite, and severe abd pain. He is not having nausea or vomiting, he is able to keep fluids down and bms are ok, he does not have Covid-19 sxs either. PA  Increased pt's acid reflux medication. He would like to get an appt asap due to pt's complex IBS hx.

## 2018-09-27 ENCOUNTER — Ambulatory Visit (INDEPENDENT_AMBULATORY_CARE_PROVIDER_SITE_OTHER): Payer: Medicare Other | Admitting: Gastroenterology

## 2018-09-27 ENCOUNTER — Other Ambulatory Visit: Payer: Self-pay

## 2018-09-27 ENCOUNTER — Encounter: Payer: Self-pay | Admitting: Gastroenterology

## 2018-09-27 ENCOUNTER — Telehealth: Payer: Self-pay | Admitting: Gastroenterology

## 2018-09-27 DIAGNOSIS — K2100 Gastro-esophageal reflux disease with esophagitis, without bleeding: Secondary | ICD-10-CM

## 2018-09-27 DIAGNOSIS — E43 Unspecified severe protein-calorie malnutrition: Secondary | ICD-10-CM

## 2018-09-27 DIAGNOSIS — K5904 Chronic idiopathic constipation: Secondary | ICD-10-CM | POA: Diagnosis not present

## 2018-09-27 DIAGNOSIS — R1084 Generalized abdominal pain: Secondary | ICD-10-CM | POA: Diagnosis not present

## 2018-09-27 DIAGNOSIS — R112 Nausea with vomiting, unspecified: Secondary | ICD-10-CM | POA: Diagnosis not present

## 2018-09-27 DIAGNOSIS — R630 Anorexia: Secondary | ICD-10-CM

## 2018-09-27 MED ORDER — LIDOCAINE 5 % EX PTCH: MEDICATED_PATCH | CUTANEOUS | 0 refills | Status: DC

## 2018-09-27 MED ORDER — FAMOTIDINE 20 MG PO TABS: ORAL_TABLET | ORAL | 3 refills | Status: DC

## 2018-09-27 MED ORDER — DICYCLOMINE HCL 10 MG PO CAPS: 10.0000 mg | ORAL_CAPSULE | Freq: Three times a day (TID) | ORAL | 3 refills | Status: DC | PRN

## 2018-09-27 MED ORDER — OMEPRAZOLE 40 MG PO CPDR: 40.0000 mg | DELAYED_RELEASE_CAPSULE | Freq: Two times a day (BID) | ORAL | 3 refills | Status: DC

## 2018-09-27 MED ORDER — MIRTAZAPINE 15 MG PO TABS: 15.0000 mg | ORAL_TABLET | Freq: Every day | ORAL | 3 refills | Status: DC
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
## 2018-09-27 NOTE — Telephone Encounter (Signed)
Pt called in and stated she forgot to ask provider on call today to send in the prescription for lidocaine (LIDODERM) 5 % [[id number]]

## 2018-09-27 NOTE — Patient Instructions (Addendum)
Omeprazole  40 mg twice, 30 minutes before breakfast and dinner X 60 capsules with 3 refills  Pepcid 20mg  after lunch and bedtime as needed  Stop Pantoprazole   Start Remeron 15mg  at bedtime X 90 capsules  Dicyclomine 10mg  every 8 hours as needed X 90 capsules  Follow-up telephone visit in 4 weeks - I have scheduled this for 10/27/2018 at 9:00am; Just call the office if this is not convenient.

## 2018-09-27 NOTE — Telephone Encounter (Signed)
Okay to refill Lidoderm patch.  Thank you

## 2018-09-28 ENCOUNTER — Telehealth: Payer: Self-pay | Admitting: Gastroenterology

## 2018-09-28 NOTE — Telephone Encounter (Signed)
Pt called stating that she would like Dr. Silverio Decamp and her PCP Dr. Tamala Julian to communicate regarding her acid reflux medication, she states that they both have been prescribing the same medications with different instructions so she is getting confused, she mentioned esomeprazole and mirtazapine. Pls call her.

## 2018-09-30 NOTE — Telephone Encounter (Signed)
Discussed with the patient all new medications All answered clarified  She will call back with any additional questions or concerns.

## 2018-10-01 ENCOUNTER — Telehealth: Payer: Self-pay | Admitting: Internal Medicine

## 2018-10-01 DIAGNOSIS — Z20822 Contact with and (suspected) exposure to covid-19: Secondary | ICD-10-CM

## 2018-10-01 NOTE — Telephone Encounter (Signed)
Patient called and spoke to her daughter [name] about the potential exposure to COVID-19 at her OV on 09/21/18 at [facility] and that we are offering free testing. She says she will call back and let us know, advised to call after 7 am [phone].

## 2018-10-01 NOTE — Telephone Encounter (Signed)
Spoke with the patient.  Advised that she has had a possible exposure to Covid-19 at her visit to [facility] on 09/21/18.  Offered the patient free Covid-19 screening for 10/01/18.  She accepted the screening to 10/01/18 but she needs to arrange transportation with her daughter.  She does not drive.  Believes that she will be able to come this afternoon.

## 2018-10-12 ENCOUNTER — Other Ambulatory Visit: Payer: Self-pay | Admitting: Gastroenterology

## 2018-10-20 NOTE — Telephone Encounter (Signed)
Okay refill. 

## 2018-10-26 ENCOUNTER — Encounter: Payer: Self-pay | Admitting: *Deleted

## 2018-10-27 ENCOUNTER — Ambulatory Visit (INDEPENDENT_AMBULATORY_CARE_PROVIDER_SITE_OTHER): Payer: Medicare Other | Admitting: Gastroenterology

## 2018-10-27 ENCOUNTER — Encounter: Payer: Self-pay | Admitting: Gastroenterology

## 2018-10-27 VITALS — Ht 63.0 in | Wt 135.0 lb

## 2018-10-27 DIAGNOSIS — K581 Irritable bowel syndrome with constipation: Secondary | ICD-10-CM

## 2018-10-27 DIAGNOSIS — K2100 Gastro-esophageal reflux disease with esophagitis, without bleeding: Secondary | ICD-10-CM

## 2018-10-27 DIAGNOSIS — R1084 Generalized abdominal pain: Secondary | ICD-10-CM | POA: Diagnosis not present

## 2018-10-27 MED ORDER — ESOMEPRAZOLE MAGNESIUM 40 MG PO CPDR: 40.0000 mg | DELAYED_RELEASE_CAPSULE | Freq: Two times a day (BID) | ORAL | 3 refills | Status: DC

## 2018-10-27 MED ORDER — LIDOCAINE 5 % EX PTCH: MEDICATED_PATCH | CUTANEOUS | 0 refills | Status: DC

## 2018-10-27 NOTE — Patient Instructions (Addendum)
Bowel purge today with 7 capful MiraLAX mixed in 32 ounce liquid  Start taking 1 capful of MiraLAX in 8 ounces liquid starting tomorrow  Start Nexium 40 mg twice daily, 30 minutes before breakfast and dinner ( We will send your prescription to your pharmacy)  Stop omeprazole  Antireflux measures  Refill for lidocaine patches for 90 days ( We have sent refills to your pharmacy)  Follow-up virtual visit in 6 to 8 weeks  I appreciate the  opportunity to care for you  Thank You   Harl Bowie , MD   Gastroesophageal Reflux Disease, Adult Gastroesophageal reflux (GER) happens when acid from the stomach flows up into the tube that connects the mouth and the stomach (esophagus). Normally, food travels down the esophagus and stays in the stomach to be digested. However, when a person has GER, food and stomach acid sometimes move back up into the esophagus. If this becomes a more serious problem, the person may be diagnosed with a disease called gastroesophageal reflux disease (GERD). GERD occurs when the reflux:  Happens often.  Causes frequent or severe symptoms.  Causes problems such as damage to the esophagus. When stomach acid comes in contact with the esophagus, the acid may cause soreness (inflammation) in the esophagus. Over time, GERD may create small holes (ulcers) in the lining of the esophagus. What are the causes? This condition is caused by a problem with the muscle between the esophagus and the stomach (lower esophageal sphincter, or LES). Normally, the LES muscle closes after food passes through the esophagus to the stomach. When the LES is weakened or abnormal, it does not close properly, and that allows food and stomach acid to go back up into the esophagus. The LES can be weakened by certain dietary substances, medicines, and medical conditions, including:  Tobacco use.  Pregnancy.  Having a hiatal hernia.  Alcohol use.  Certain foods and beverages, such as  coffee, chocolate, onions, and peppermint. What increases the risk? You are more likely to develop this condition if you:  Have an increased body weight.  Have a connective tissue disorder.  Use NSAID medicines. What are the signs or symptoms? Symptoms of this condition include:  Heartburn.  Difficult or painful swallowing.  The feeling of having a lump in the throat.  Abitter taste in the mouth.  Bad breath.  Having a large amount of saliva.  Having an upset or bloated stomach.  Belching.  Chest pain. Different conditions can cause chest pain. Make sure you see your health care provider if you experience chest pain.  Shortness of breath or wheezing.  Ongoing (chronic) cough or a night-time cough.  Wearing away of tooth enamel.  Weight loss. How is this diagnosed? Your health care provider will take a medical history and perform a physical exam. To determine if you have mild or severe GERD, your health care provider may also monitor how you respond to treatment. You may also have tests, including:  A test to examine your stomach and esophagus with a small camera (endoscopy).  A test thatmeasures the acidity level in your esophagus.  A test thatmeasures how much pressure is on your esophagus.  A barium swallow or modified barium swallow test to show the shape, size, and functioning of your esophagus. How is this treated? The goal of treatment is to help relieve your symptoms and to prevent complications. Treatment for this condition may vary depending on how severe your symptoms are. Your health care provider may recommend:  Changes to your diet.  Medicine.  Surgery. Follow these instructions at home: Eating and drinking   Follow a diet as recommended by your health care provider. This may involve avoiding foods and drinks such as: ? Coffee and tea (with or without caffeine). ? Drinks that containalcohol. ? Energy drinks and sports  drinks. ? Carbonated drinks or sodas. ? Chocolate and cocoa. ? Peppermint and mint flavorings. ? Garlic and onions. ? Horseradish. ? Spicy and acidic foods, including peppers, chili powder, curry powder, vinegar, hot sauces, and barbecue sauce. ? Citrus fruit juices and citrus fruits, such as oranges, lemons, and limes. ? Tomato-based foods, such as red sauce, chili, salsa, and pizza with red sauce. ? Fried and fatty foods, such as donuts, french fries, potato chips, and high-fat dressings. ? High-fat meats, such as hot dogs and fatty cuts of red and white meats, such as rib eye steak, sausage, ham, and bacon. ? High-fat dairy items, such as whole milk, butter, and cream cheese.  Eat small, frequent meals instead of large meals.  Avoid drinking large amounts of liquid with your meals.  Avoid eating meals during the 2-3 hours before bedtime.  Avoid lying down right after you eat.  Do not exercise right after you eat. Lifestyle   Do not use any products that contain nicotine or tobacco, such as cigarettes, e-cigarettes, and chewing tobacco. If you need help quitting, ask your health care provider.  Try to reduce your stress by using methods such as yoga or meditation. If you need help reducing stress, ask your health care provider.  If you are overweight, reduce your weight to an amount that is healthy for you. Ask your health care provider for guidance about a safe weight loss goal. General instructions  Pay attention to any changes in your symptoms.  Take over-the-counter and prescription medicines only as told by your health care provider. Do not take aspirin, ibuprofen, or other NSAIDs unless your health care provider told you to do so.  Wear loose-fitting clothing. Do not wear anything tight around your waist that causes pressure on your abdomen.  Raise (elevate) the head of your bed about 6 inches (15 cm).  Avoid bending over if this makes your symptoms worse.  Keep all  follow-up visits as told by your health care provider. This is important. Contact a health care provider if:  You have: ? New symptoms. ? Unexplained weight loss. ? Difficulty swallowing or it hurts to swallow. ? Wheezing or a persistent cough. ? A hoarse voice.  Your symptoms do not improve with treatment. Get help right away if you:  Have pain in your arms, neck, jaw, teeth, or back.  Feel sweaty, dizzy, or light-headed.  Have chest pain or shortness of breath.  Vomit and your vomit looks like blood or coffee grounds.  Faint.  Have stool that is bloody or black.  Cannot swallow, drink, or eat. Summary  Gastroesophageal reflux happens when acid from the stomach flows up into the esophagus. GERD is a disease in which the reflux happens often, causes frequent or severe symptoms, or causes problems such as damage to the esophagus.  Treatment for this condition may vary depending on how severe your symptoms are. Your health care provider may recommend diet and lifestyle changes, medicine, or surgery.  Contact a health care provider if you have new or worsening symptoms.  Take over-the-counter and prescription medicines only as told by your health care provider. Do not take aspirin, ibuprofen, or other  NSAIDs unless your health care provider told you to do so.  Keep all follow-up visits as told by your health care provider. This is important. This information is not intended to replace advice given to you by your health care provider. Make sure you discuss any questions you have with your health care provider. Document Released: 01/28/2005 Document Revised: 10/27/2017 Document Reviewed: 10/27/2017 Elsevier Interactive Patient Education  2019 [employer].
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
## 2018-11-01 ENCOUNTER — Encounter: Payer: Self-pay | Admitting: Gastroenterology

## 2018-11-07 ENCOUNTER — Telehealth: Payer: Self-pay | Admitting: Gastroenterology

## 2018-11-07 NOTE — Telephone Encounter (Signed)
States her surgical scar that is 27+ years old has been noted to be pink. She is referring to the midline incision she has. No itching, burning or tenderness. No rash. Denies any pressure on the incision from clothing. No rash or raised area.  States the incision is protected from sunlight. She is not certain how long it has appeared pink, but she noted it 2 days ago when bathing. Reassured patient that it does not sound concerning but would certainly encourage her to consult her PCP if she has concerns. She thanks me for the call.

## 2018-11-07 NOTE — Telephone Encounter (Signed)
Pt states that a scar from a surgery that she had few years ago is a bit pinkish like if the scab came off, she wants to know if she should be concerned. Pls call her.

## 2018-11-08 ENCOUNTER — Telehealth: Payer: Self-pay | Admitting: *Deleted

## 2018-11-08 MED ORDER — CYANOCOBALAMIN 1000 MCG/ML IJ SOLN: 1000.0000 ug | INTRAMUSCULAR | 0 refills | Status: DC

## 2018-11-08 NOTE — Telephone Encounter (Signed)
Received call from pt asking if Dr Burr Medico refilled her Vit B 12.  Reviewed chart & Dr Burr Medico changed Vit B 12 to every 2 wks.  Pt gives to herself at home but hasn't had in a while.  Informed that we would call in a couple of doses & verify with Dr Burr Medico for future refills.  She states she forgets to take her iron.  Discussed getting [facility] to put meds in so that she knows if she took meds or not which might help.  Message routed to [place].

## 2018-11-09 ENCOUNTER — Other Ambulatory Visit: Payer: Self-pay

## 2018-11-09 ENCOUNTER — Encounter: Payer: Self-pay | Admitting: Podiatry

## 2018-11-09 ENCOUNTER — Ambulatory Visit (INDEPENDENT_AMBULATORY_CARE_PROVIDER_SITE_OTHER): Payer: Medicare Other | Admitting: Podiatry

## 2018-11-09 VITALS — Temp 97.9°F

## 2018-11-09 DIAGNOSIS — L309 Dermatitis, unspecified: Secondary | ICD-10-CM

## 2018-11-09 DIAGNOSIS — E119 Type 2 diabetes mellitus without complications: Secondary | ICD-10-CM

## 2018-11-09 DIAGNOSIS — M79675 Pain in left toe(s): Secondary | ICD-10-CM | POA: Diagnosis not present

## 2018-11-09 DIAGNOSIS — B351 Tinea unguium: Secondary | ICD-10-CM

## 2018-11-09 DIAGNOSIS — M79674 Pain in right toe(s): Secondary | ICD-10-CM

## 2018-11-09 DIAGNOSIS — I10 Essential (primary) hypertension: Secondary | ICD-10-CM

## 2018-11-09 IMAGING — DX DG CHEST 2V
2 series · 2 of 2 positions shown · non-contrast
Comparison: Chest x-ray dated April 08, 2017.

CLINICAL DATA: Confusion and weakness.

EXAM:
CHEST  2 VIEW

[chest lat]
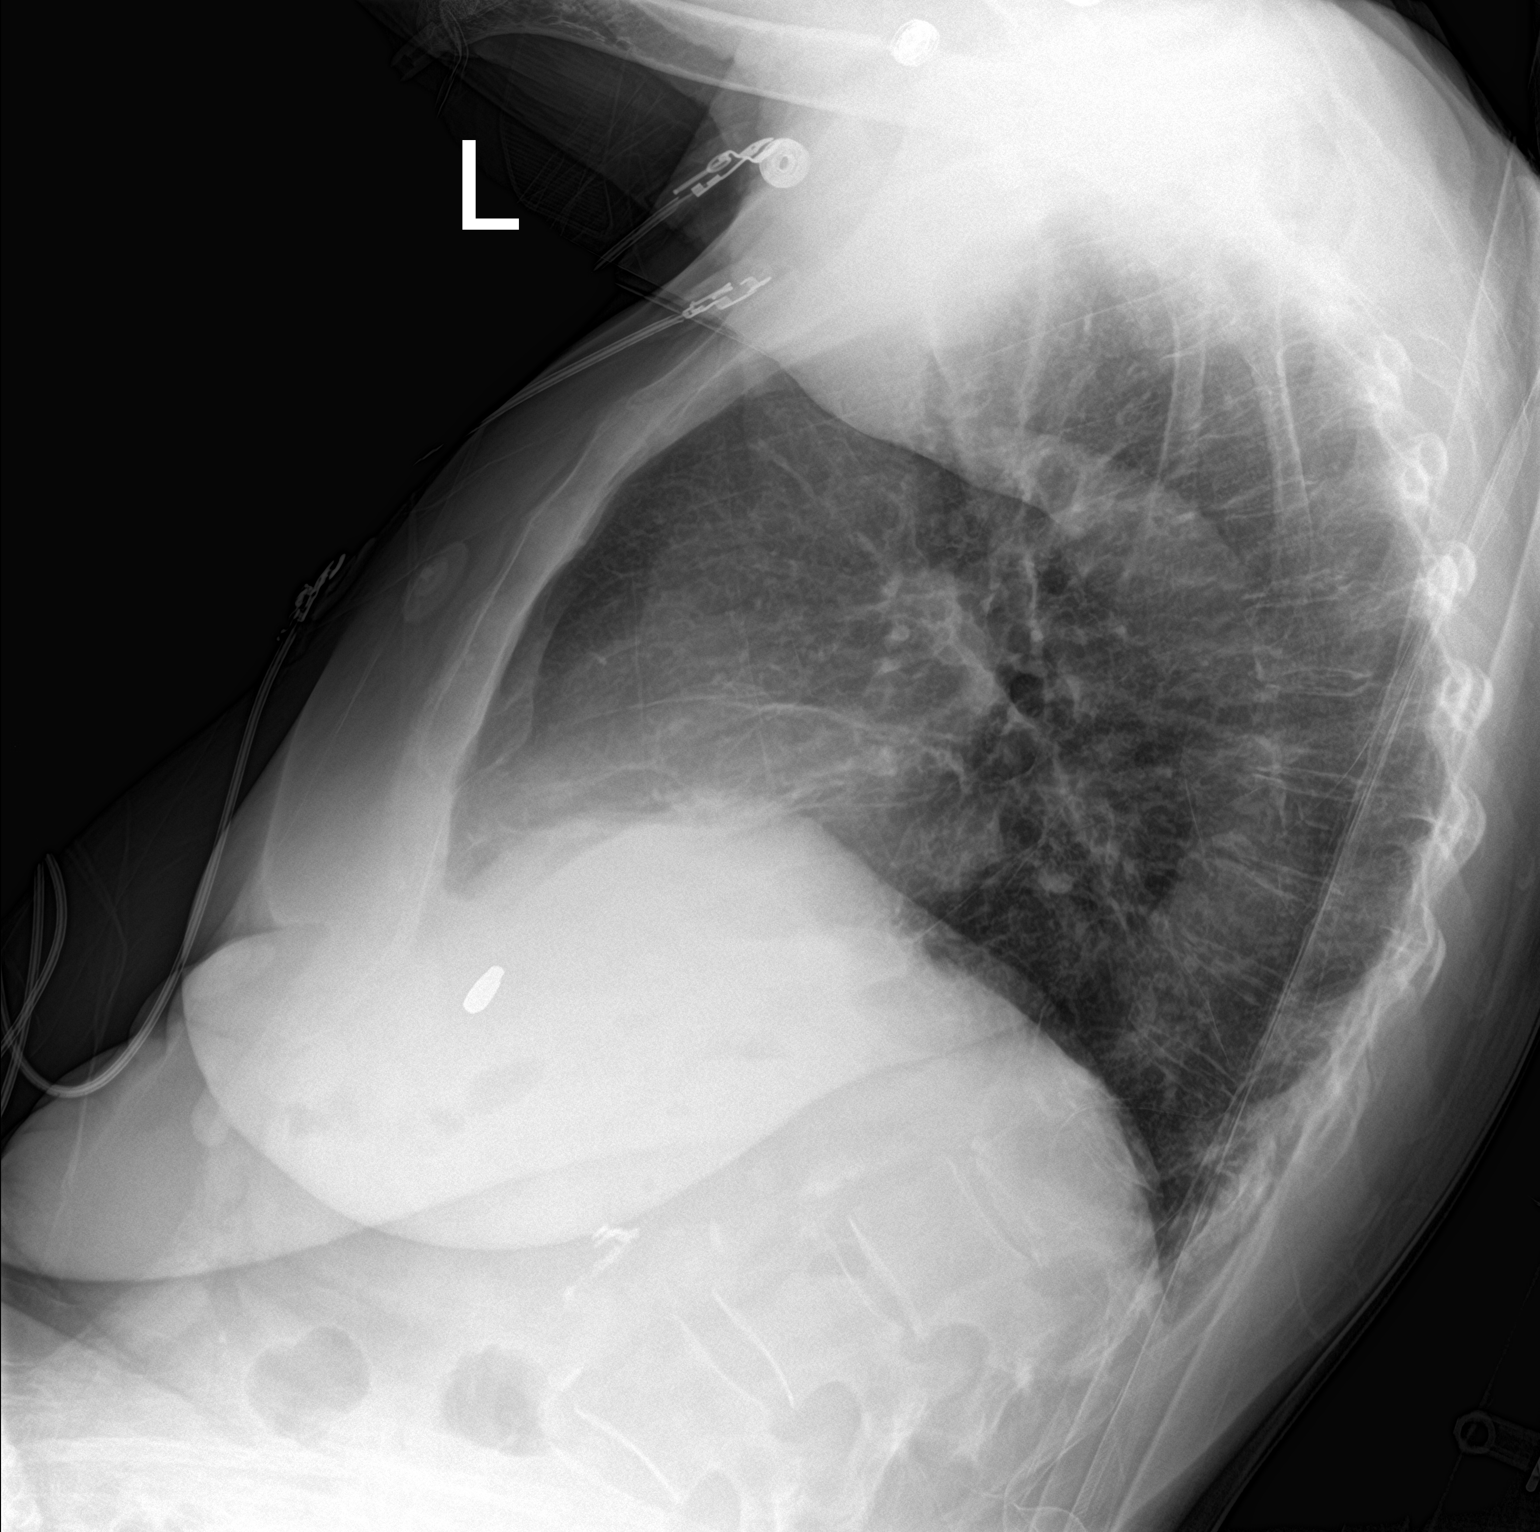

[chest ap]
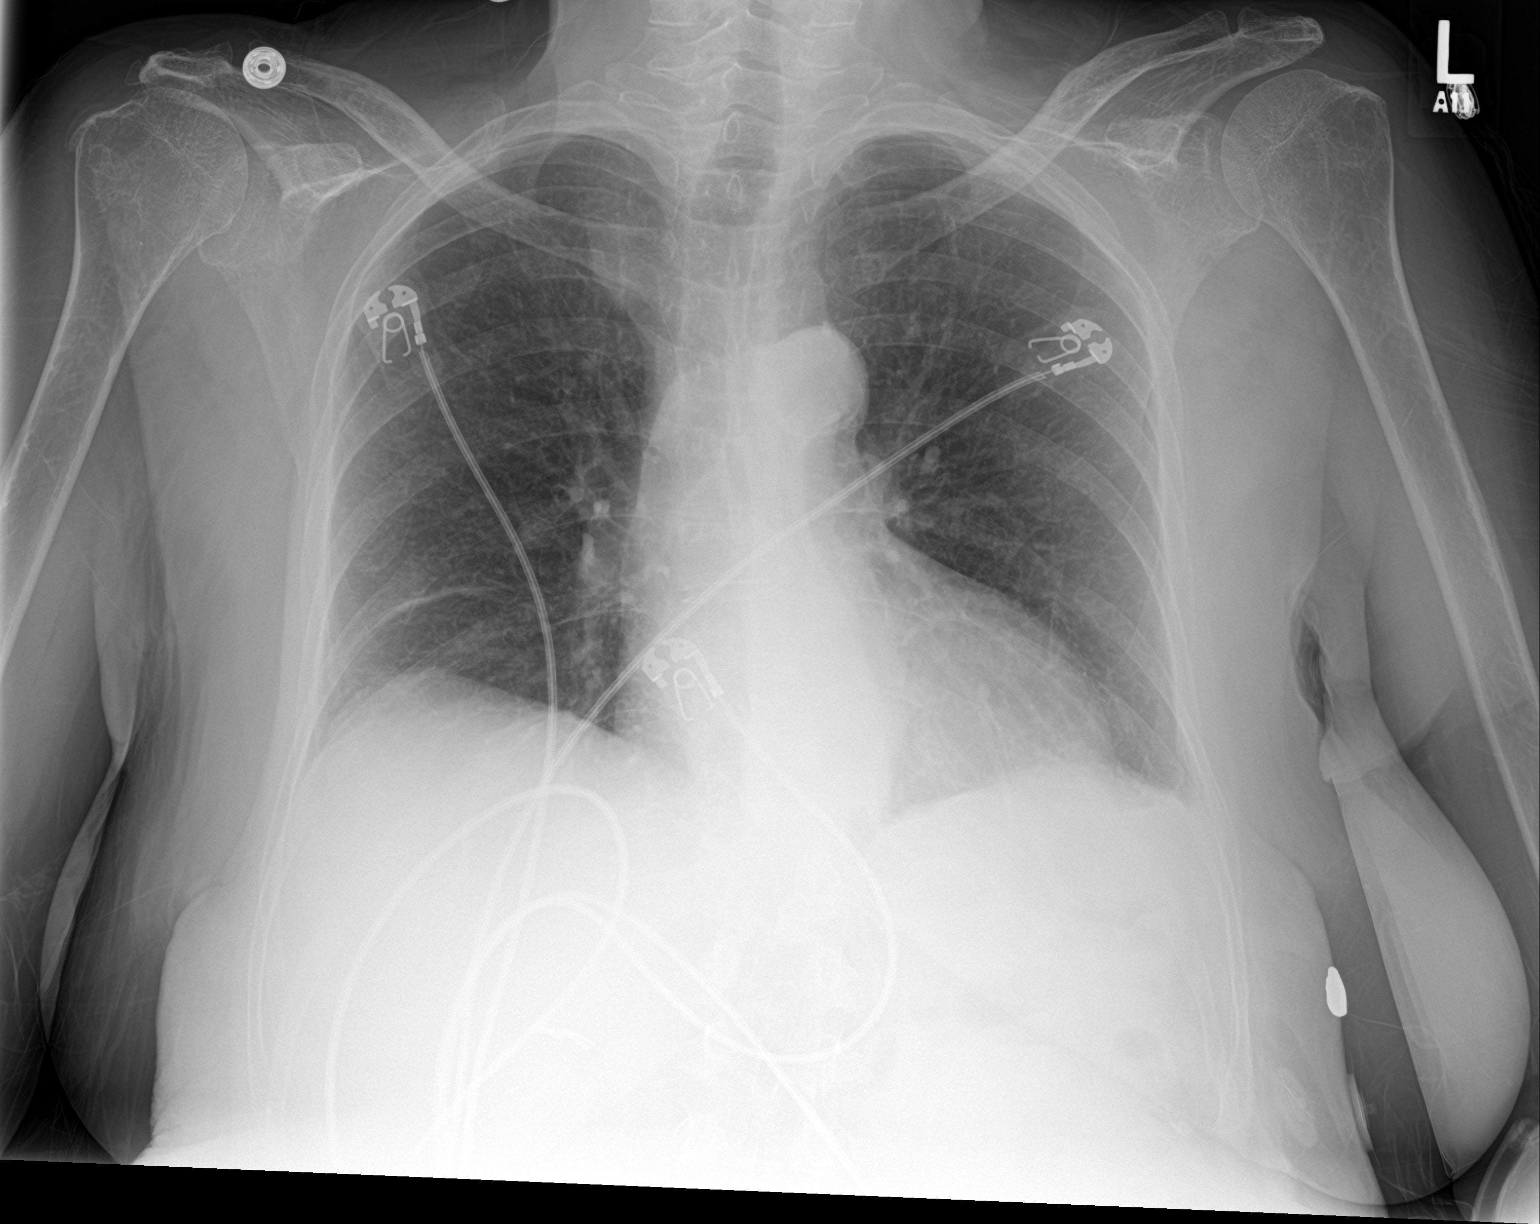

[2 of 2 positions shown; findings below may reference images not displayed]

FINDINGS: The heart size and mediastinal contours are within normal limits.
Normal pulmonary vascularity. No focal consolidation, pleural
effusion, or pneumothorax. No acute osseous abnormality. Unchanged
thoracic kyphosis.
IMPRESSION: No active cardiopulmonary disease.

## 2018-11-09 IMAGING — CT CT HEAD W/O CM
4 series · 15 of 47 positions shown, 17 images · non-contrast
Comparison: 06/07/2013 CT head.  07/12/2013 MRI head.

CLINICAL DATA: 77 y/o  F; altered mental status.

EXAM:
CT HEAD WITHOUT CONTRAST
TECHNIQUE: Contiguous axial images were obtained from the base of the skull
through the vertex without intravenous contrast.

[Series 3: head wo · axial · 0.44mm/px · z∈[-139,-24]mm · 7 of 31 slices shown, 9 images]
[im 4/31  brain]
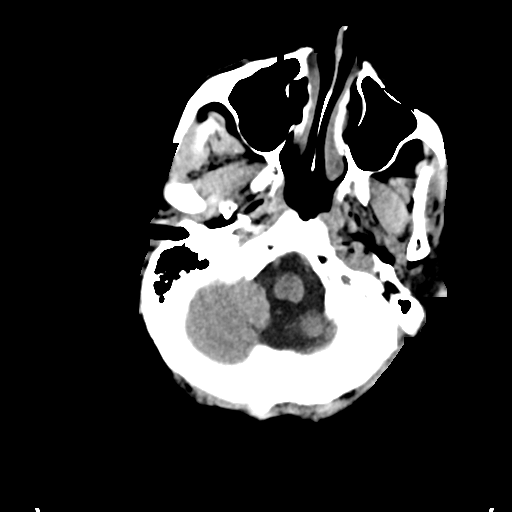
[im 4/31  bone]
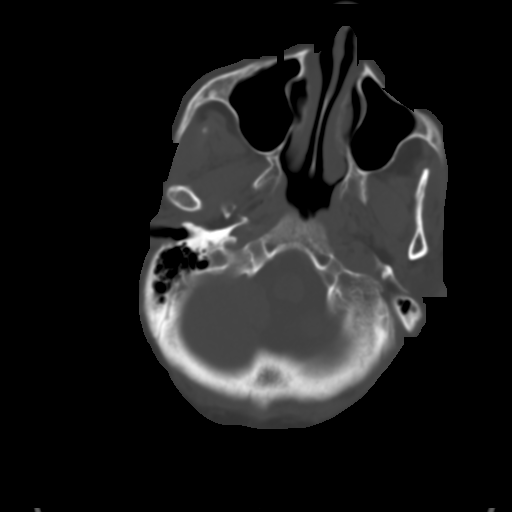
[im 8/31  brain]
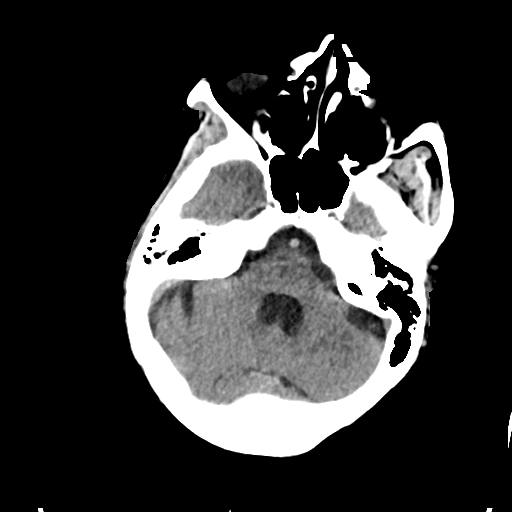
[im 12/31  brain]
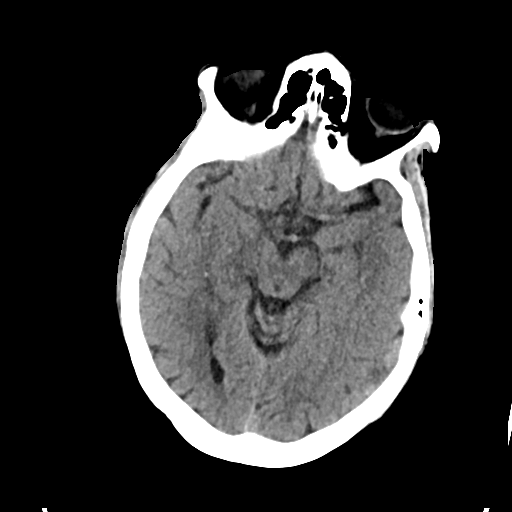
[im 16/31  brain]
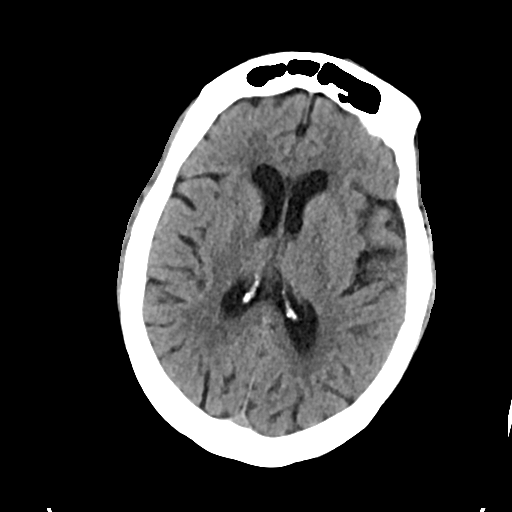
[im 19/31  brain]
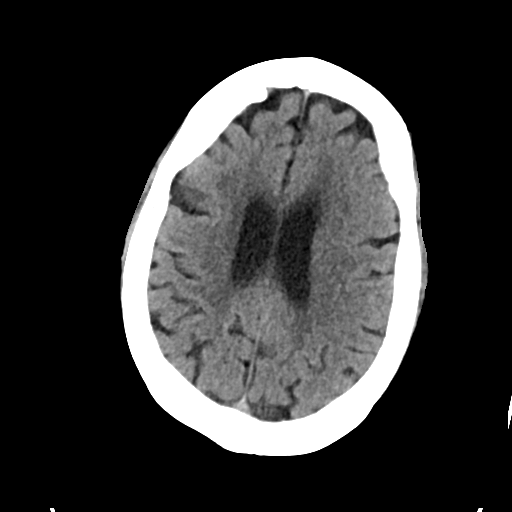
[im 19/31  bone]
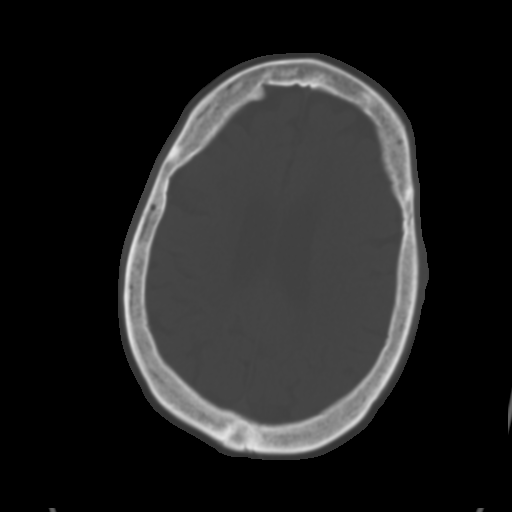
[im 23/31  brain]
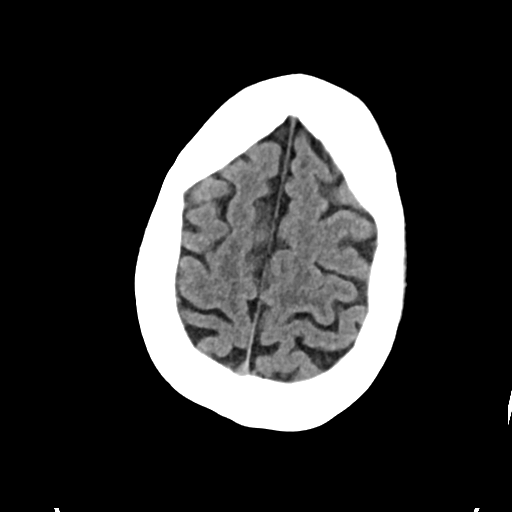
[im 27/31  brain]
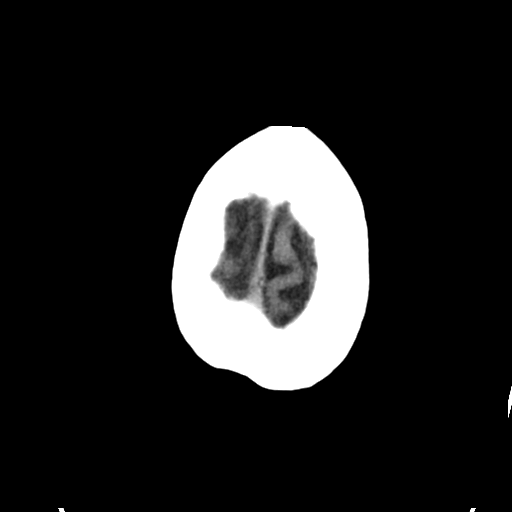

[Series 4: head bone · axial · 0.44mm/px · z∈[-140,-124]mm · 2 of 77 slices shown]
[im 8/77  bone]
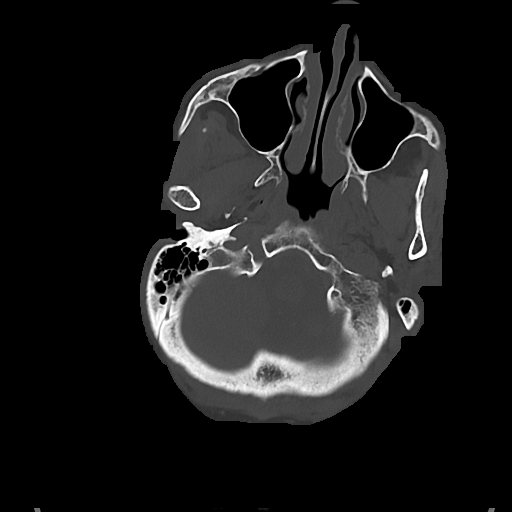
[im 16/77  bone]
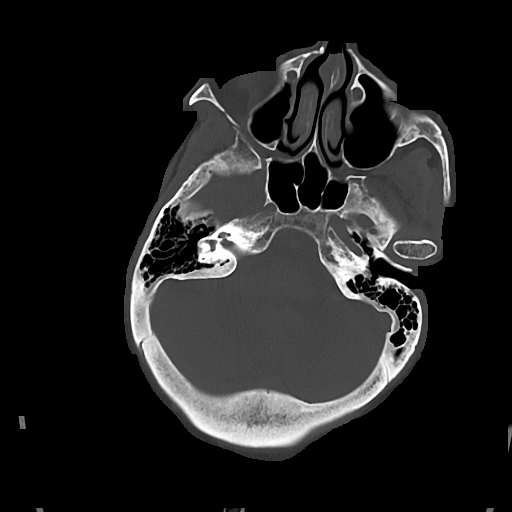

[Series 5: cor soft · coronal · 0.33mm/px · 3 of 66 slices shown]
[im 22/66  brain]
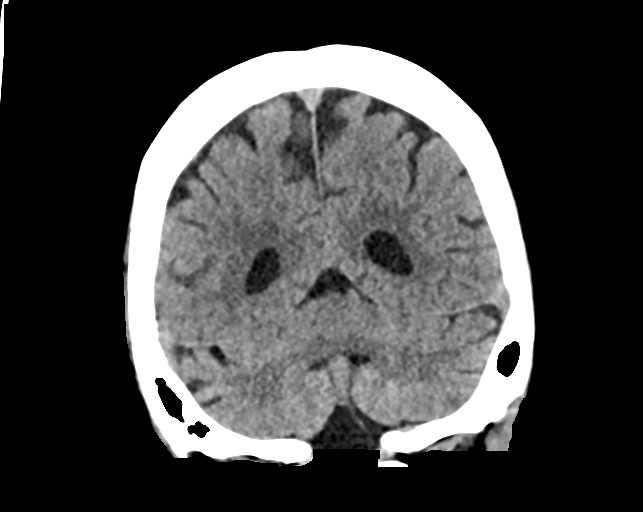
[im 29/66  brain]
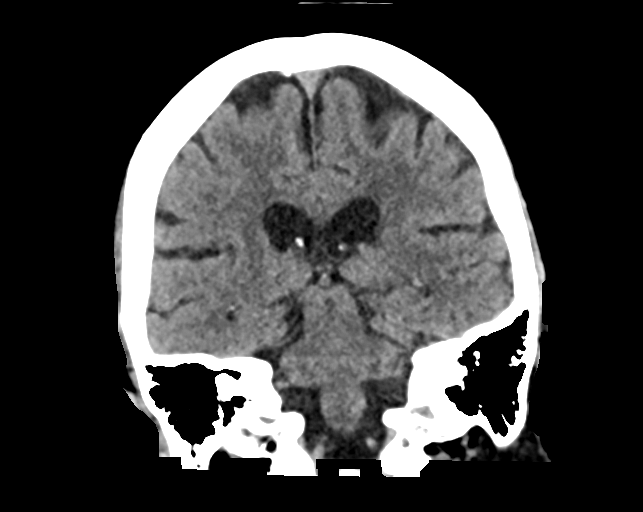
[im 37/66  brain]
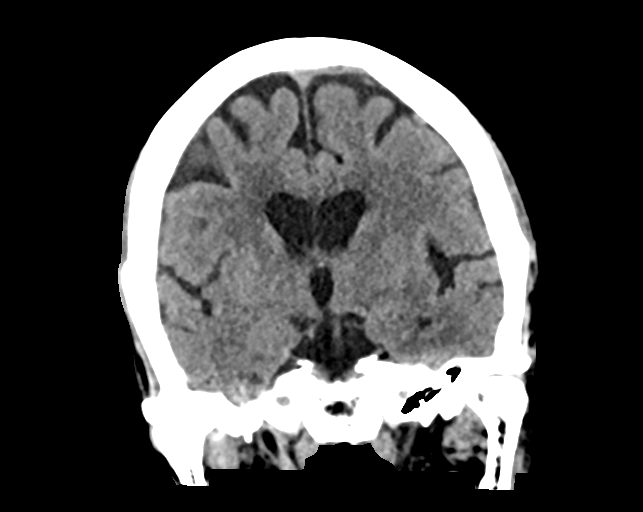

[Series 6: sag soft · sagittal · 0.33mm/px · 3 of 50 slices shown]
[im 17/50  brain]
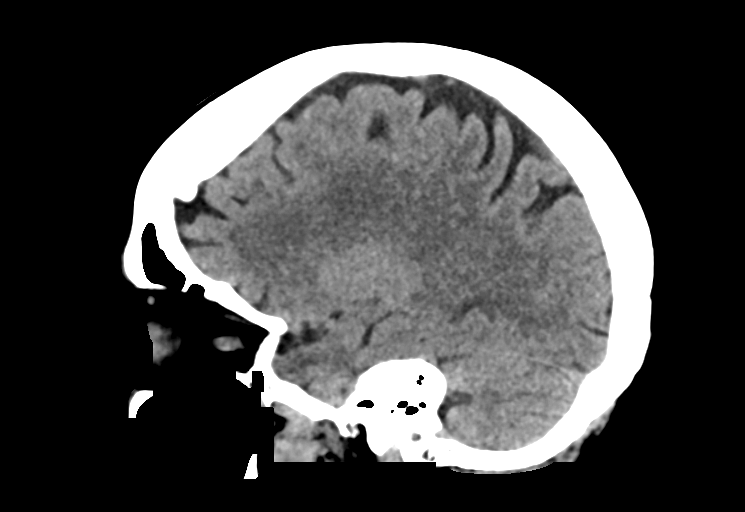
[im 25/50  brain]
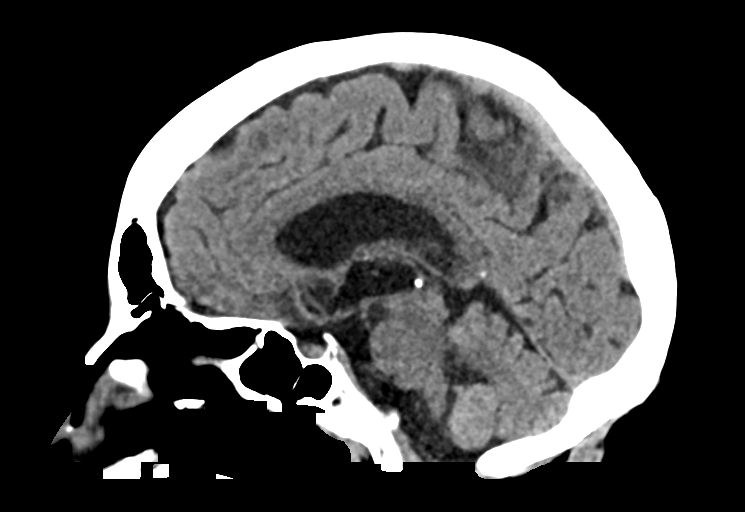
[im 33/50  brain]
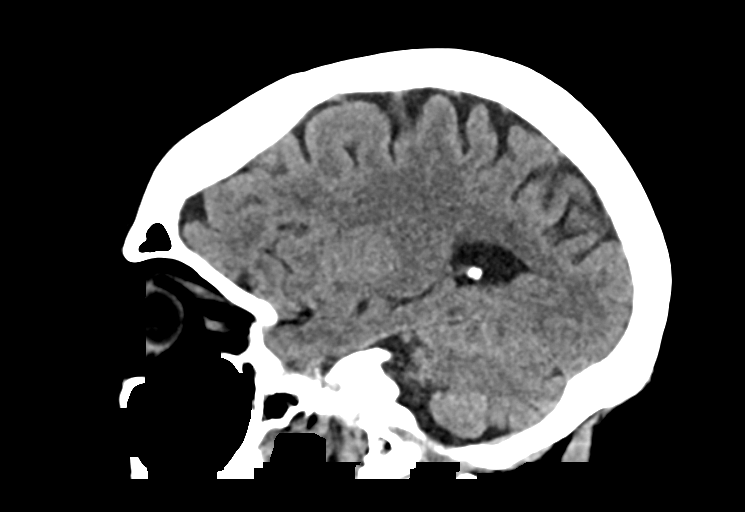

[15 of 47 positions shown; findings below may reference images not displayed]

FINDINGS: Brain: No evidence of acute infarction, hemorrhage, hydrocephalus,
extra-axial collection or mass lesion/mass effect. Stable chronic
microvascular ischemic changes and parenchymal volume loss of the
brain.

Vascular: Calcific atherosclerosis of carotid siphons. No hyperdense
vessel.

Skull: Normal. Negative for fracture or focal lesion.

Sinuses/Orbits: Small maxillary sinus mucous retention cyst. Left
intra-ocular lens replacement. Otherwise negative.

Other: None.
IMPRESSION: 1. No acute intracranial abnormality identified.
2. Stable chronic microvascular ischemic changes and parenchymal
volume loss of the brain.

By: Otilio Atchison M.D.

## 2018-11-09 MED ORDER — AMMONIUM LACTATE 12 % EX CREA: TOPICAL_CREAM | CUTANEOUS | 0 refills | Status: DC | PRN

## 2018-11-09 NOTE — Progress Notes (Addendum)
Complaint:  Visit Type: Patient returns to my office for continued preventative foot care services. Complaint: Patient states" my nails have grown long and thick and become painful to walk and wear shoes" Patient has been diagnosed with DM with neuropathy.. The patient presents for preventative foot care services. No changes to ROS  Podiatric Exam: Vascular: dorsalis pedis and posterior tibial pulses are palpable bilateral. Capillary return is immediate. Temperature gradient is WNL. Skin turgor WNL  Sensorium: Diminished  Semmes Weinstein monofilament test. Normal tactile sensation bilaterally. Nail Exam: Pt has thick disfigured discolored nails with subungual debris noted bilateral entire nail hallux through fifth toenails Ulcer Exam: There is no evidence of ulcer or pre-ulcerative changes or infection. Orthopedic Exam: Muscle tone and strength are WNL. No limitations in general ROM. No crepitus or effusions noted. Foot type and digits show no abnormalities. Bony prominences are unremarkable. Skin: No Porokeratosis. No infection or ulcers.  Peeling noted plantar aspect feet  B/L.  Diagnosis:  Onychomycosis, , Pain in right toe, pain in left toes  Dermatitis feet  B/L.  Treatment & Plan Procedures and Treatment: Consent by patient was obtained for treatment procedures.   Debridement of mycotic and hypertrophic toenails, 1 through 5 bilateral and clearing of subungual debris. No ulceration, no infection noted. Prescribe lac-hydrin. Return Visit-Office Procedure: Patient instructed to return to the office for a follow up visit 10 weeks  for continued evaluation and treatment.    Gardiner Barefoot DPM

## 2018-11-15 ENCOUNTER — Telehealth: Payer: Self-pay

## 2018-11-15 DIAGNOSIS — Z20822 Contact with and (suspected) exposure to covid-19: Secondary | ICD-10-CM

## 2018-11-15 NOTE — Addendum Note (Signed)
Addended by: Valli Glance F on: 11/15/2018 04:21 PM   Modules accepted: Orders

## 2018-11-15 NOTE — Telephone Encounter (Signed)
Santiago Glad with Dustin Folks, FNP, [facility], request COVID 19 test for exposure. Left message for pt. To call back and schedule. Practice # [phone] Fax [phone]

## 2018-11-20 LAB — NOVEL CORONAVIRUS, NAA: SARS-CoV-2, NAA: NOT DETECTED

## 2018-11-24 ENCOUNTER — Other Ambulatory Visit: Payer: Self-pay | Admitting: Gastroenterology

## 2018-11-26 IMAGING — CT CT ABD-PELV W/ CM
2 of 5 series · 15 of 46 positions shown, 17 images · IV contrast (ISOVUE 300)
Comparison: 06/30/2016.

CLINICAL DATA: Primary malignant neuroendocrine tumor of the
appendix.

EXAM:
CT ABDOMEN AND PELVIS WITH CONTRAST
TECHNIQUE: Multidetector CT imaging of the abdomen and pelvis was performed
using the standard protocol following bolus administration of
intravenous contrast.
CONTRAST:  100 mls of isovue 300

[Series 2: axial st · axial · 0.78mm/px · z∈[-434,-59]mm · 12 of 85 slices shown, 14 images]
[im 5/85  soft-tissue]
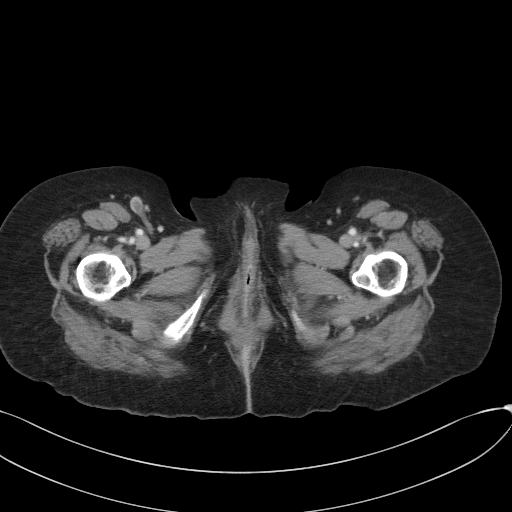
[im 5/85  bone]
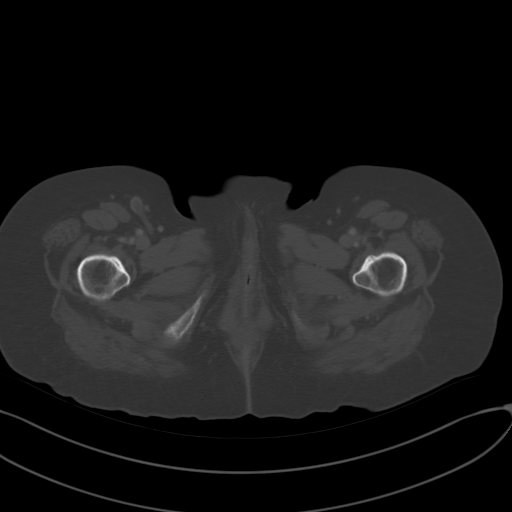
[im 15/85  soft-tissue]
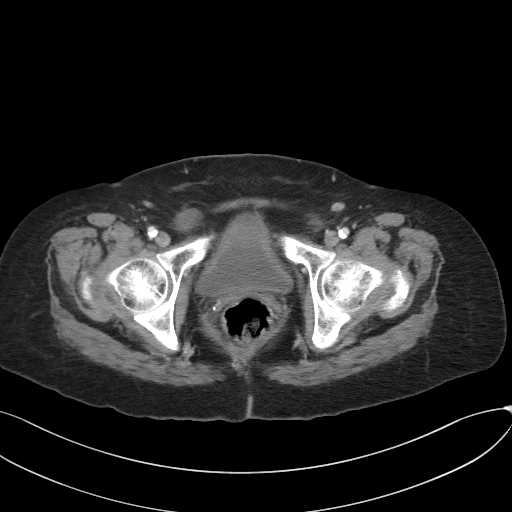
[im 20/85  soft-tissue]
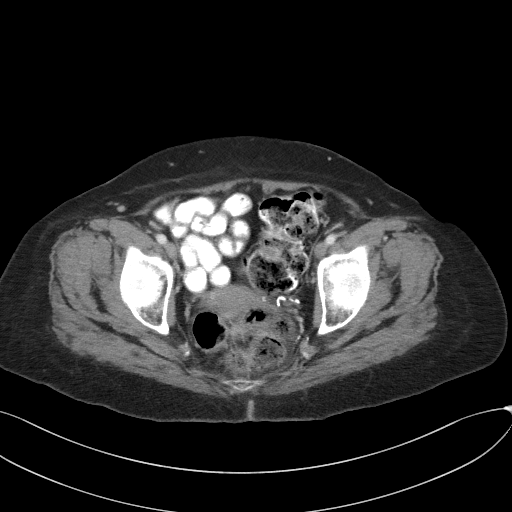
[im 25/85  soft-tissue]
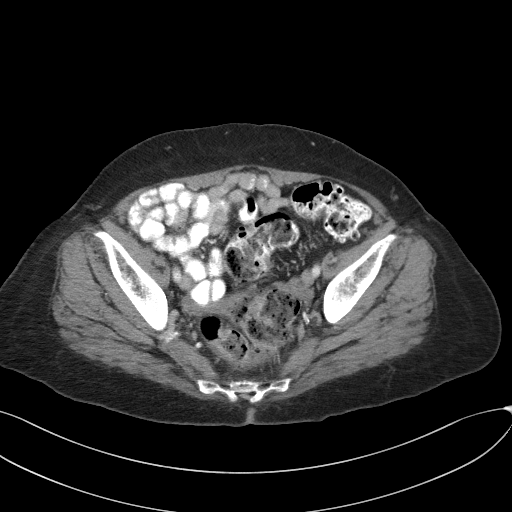
[im 35/85  soft-tissue]
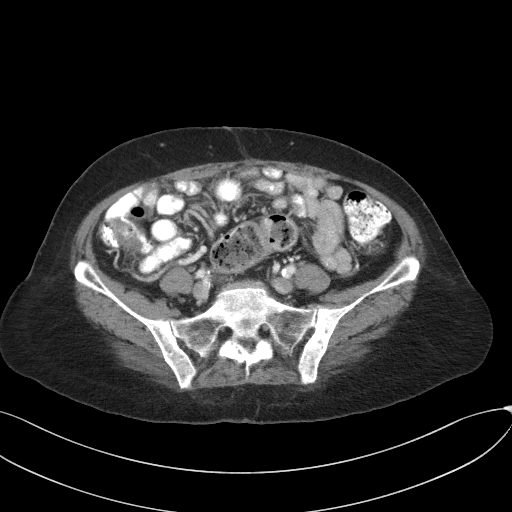
[im 40/85  soft-tissue]
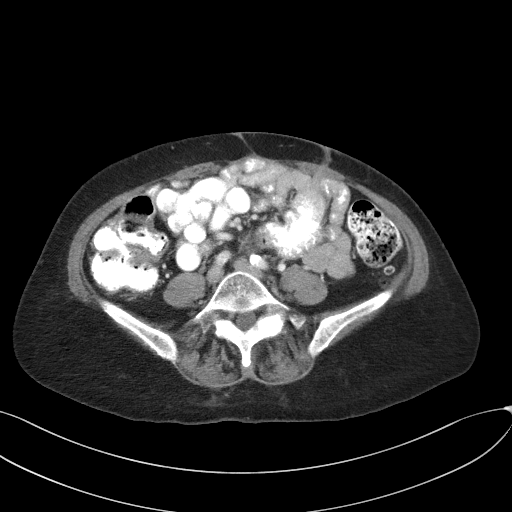
[im 45/85  soft-tissue]
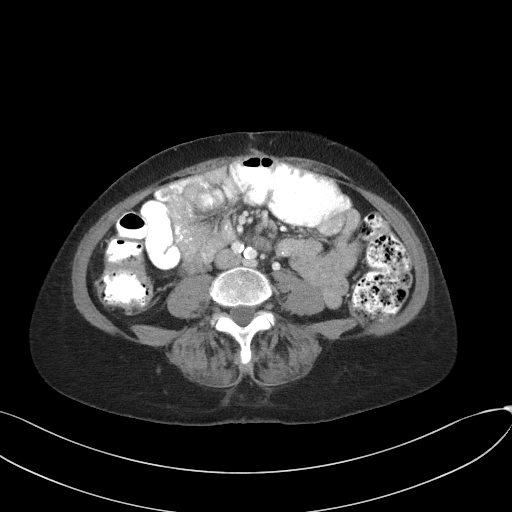
[im 55/85  soft-tissue]
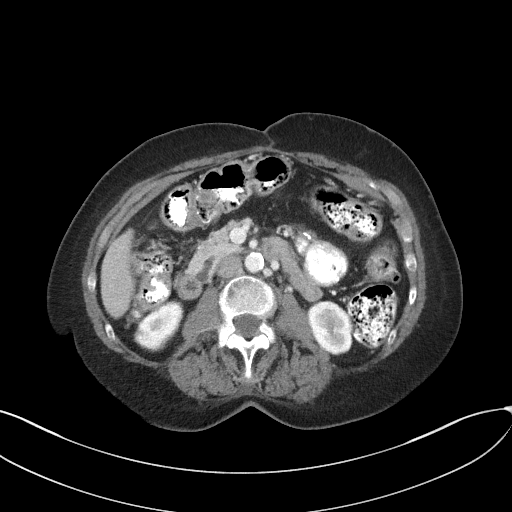
[im 60/85  soft-tissue]
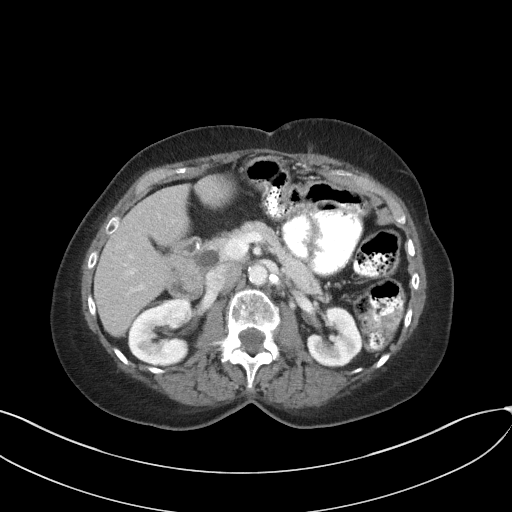
[im 60/85  bone]
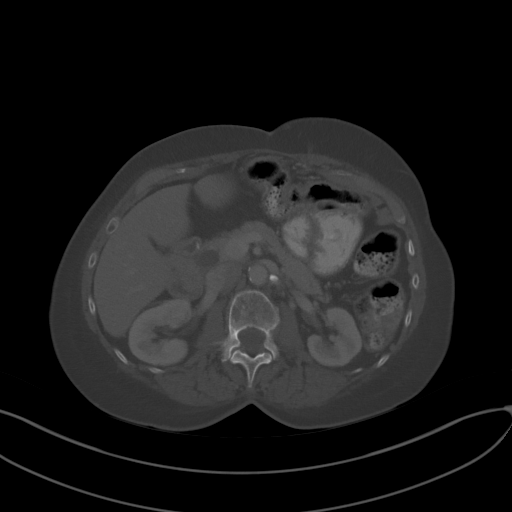
[im 65/85  soft-tissue]
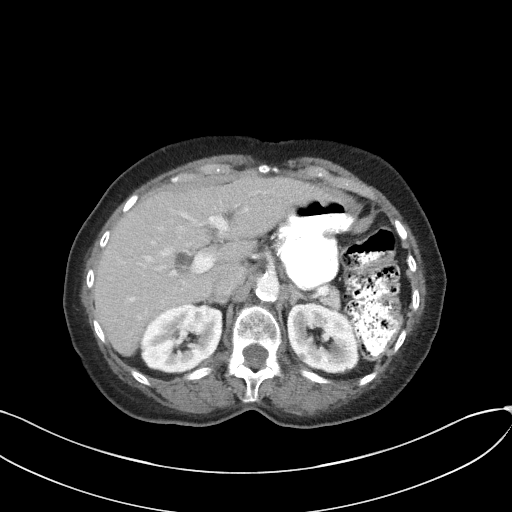
[im 75/85  soft-tissue]
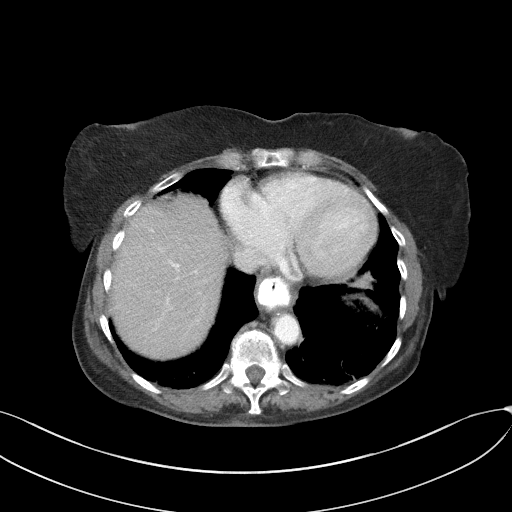
[im 80/85  soft-tissue]
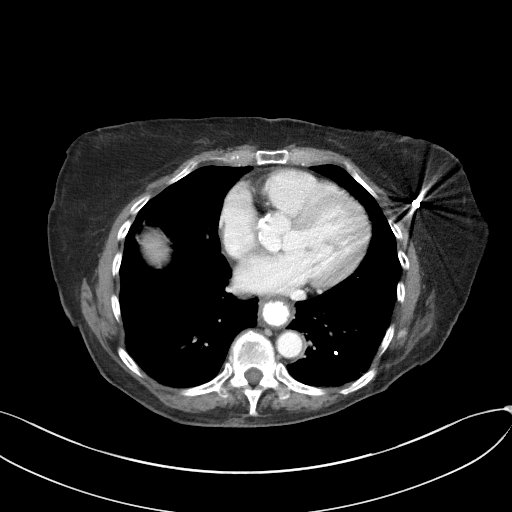

[Series 5: coronal st · coronal · 0.69mm/px · 3 of 89 slices shown]
[im 30/89  soft-tissue]
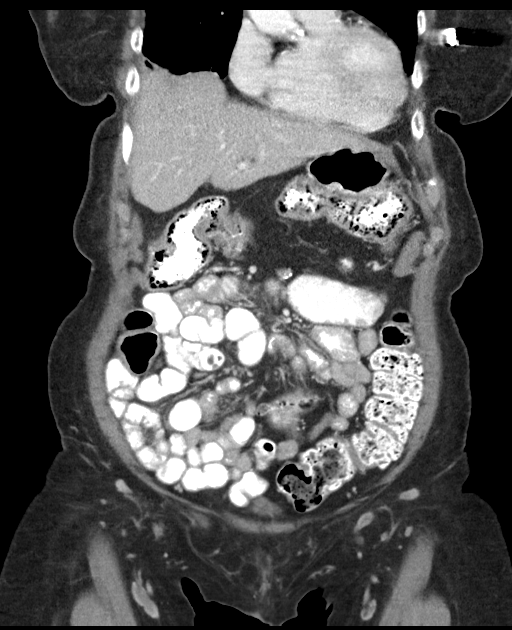
[im 40/89  soft-tissue]
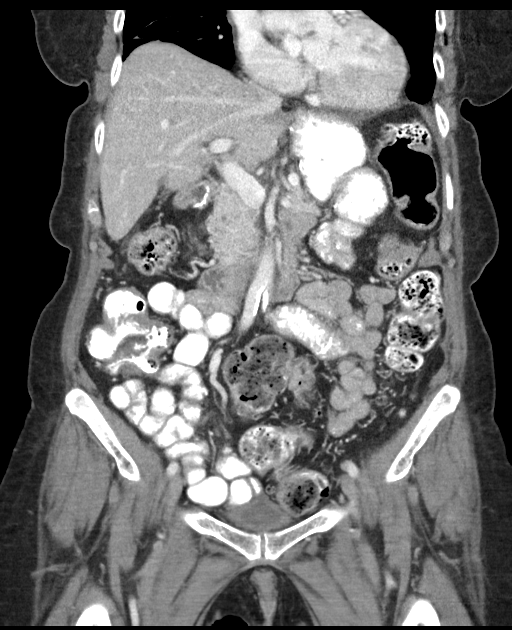
[im 49/89  soft-tissue]
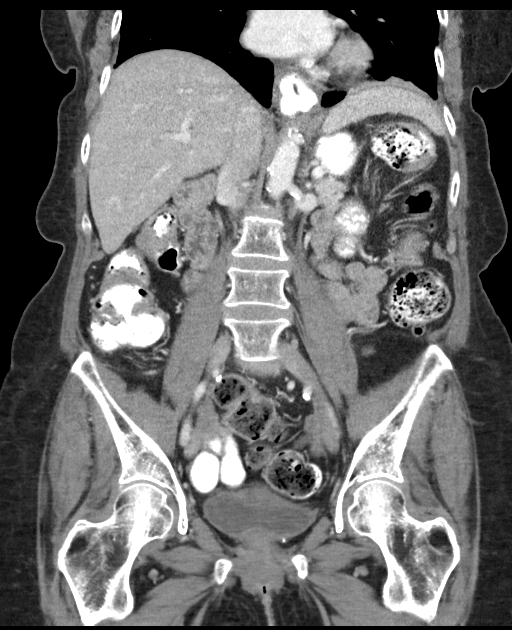

[15 of 46 positions shown; findings below may reference images not displayed]

FINDINGS: Lower chest: Scarring identified within both lower lobes and right
middle lobe. Dilation of the distal esophagus identified.

Hepatobiliary: No focal liver abnormality. Previous cholecystectomy.
There is dilatation of the common bile duct which measures up to
cm. This is similar to previous exam.

Pancreas: Increase caliber of the pancreatic duct at the level of
the head of pancreas is again noted. This is stable at 7 mm. Within
the neck, body and tail of pancreas the pancreatic duct has a normal
caliber. No pancreatic inflammation or mass identified.

Spleen: The spleen is normal.

Adrenals/Urinary Tract: Unremarkable appearance of the adrenal
glands.

Normal appearance of the left kidney. Right kidney cysts are again
noted. Multiple right renal calculi identified. The largest stone is
in the interpolar region measuring 5 mm. No right-sided
hydronephrosis or hydroureter. Urinary bladder appears normal.

Stomach/Bowel: Small to moderate hiatal hernia. Status post
gastrojejunostomy. The small bowel loops have a normal course and
caliber. At the cecal base adjacent to suture line there is a focal
area of soft tissue which measures 1.3 x 1.9 cm, image 39/5.
Nonspecific [REDACTED] reflect postoperative change. No pathologic
dilatation of the colon. Distal colonic diverticula identified
without acute inflammation.

Vascular/Lymphatic: Aortic atherosclerosis identified. No aneurysm.
No adenopathy identified within the abdomen. There is no pelvic or
inguinal adenopathy.

Reproductive: Uterus and bilateral adnexa are unremarkable.

Other: No ascites or focal fluid collections identified. No
peritoneal nodularity identified.

Musculoskeletal: Mild degenerative changes within the lower thoracic
and lumbar spine. No aggressive lytic or sclerotic bone lesions.
IMPRESSION: 1. There are no specific findings identified to suggest metastatic
disease within the abdomen or pelvis.
2. Small, nonspecific area of soft tissue at the cecal base adjacent
to appendectomy suture line. Findings may represent postsurgical
change versus residual tumor. Attention this area on follow-up
imaging is advised.
3. Small hiatal hernia, patulous and dilated distal esophagus.
4. Chronic increase caliber of the common bile duct and pancreatic
duct at the level of the pancreatic head. No stone or mass
visualized.
5. Nonobstructing right renal calculi
6.  Aortic Atherosclerosis ([id number]).

## 2018-12-05 ENCOUNTER — Telehealth: Payer: Self-pay | Admitting: Gastroenterology

## 2018-12-05 NOTE — Telephone Encounter (Signed)
Pt reported that she has a loss of appetite and that food "does not taste right" to her.  She would like a call back to schedule.

## 2018-12-06 NOTE — Telephone Encounter (Signed)
This is a question to ask Dr. Silverio Decamp when she returns. Thank you.

## 2018-12-06 NOTE — Telephone Encounter (Signed)
Patient of Dr Woodward Ku The call came in yesterday. Would you please review and advise? Patient reports that her taste and appetite have decreased again. At her last office visit she was given Remeron 15 mg to increase her appetite. She feels this was very helpful. Past couple of weeks she has felt less and less like eating. Now she can "bring myself to eat salty soups like broth or just noodles." She asks if an increase in the Remeron would help. Please advise. Thanks

## 2018-12-06 NOTE — Telephone Encounter (Signed)
Pt calling regarding this problem.

## 2018-12-06 NOTE — Telephone Encounter (Signed)
Patient is advised. She will wait for Dr Woodward Ku recommendations.

## 2018-12-13 ENCOUNTER — Other Ambulatory Visit: Payer: Self-pay

## 2018-12-13 MED ORDER — MIRTAZAPINE 30 MG PO TABS: 30.0000 mg | ORAL_TABLET | Freq: Every day | ORAL | 3 refills | Status: DC

## 2018-12-13 NOTE — Telephone Encounter (Signed)
Patient advised and agrees to this plan of care. Rx to [facility] for follow up 01/23/19 at 1:30 pm.

## 2018-12-13 NOTE — Telephone Encounter (Signed)
Ok to increase Remeron to 30 mg daily. Follow up office visit, next available.

## 2018-12-16 IMAGING — MG DIGITAL DIAGNOSTIC UNILATERAL LEFT MAMMOGRAM WITH TOMO AND CAD
6 series · 6 of 18 positions shown · non-contrast
Comparison: Previous exam(s).

ADDENDUM:
The patient returned on 07/30/2017 for stereotactic/3D guided biopsy
of the UPPER-OUTER LEFT breast asymmetry.

2D/3D full field LATERAL view of the LEFT breast was performed and
demonstrated no persistent abnormality in the area identified on
recent screening and diagnostic mammograms.
This area has a similar appearance to 9055 studies and is felt to
represent normal fibroglandular tissue or a benign process.
The biopsy was therefore not performed. After discussion with the
patient, we both agreed to follow-up this area in 6 months.
RECOMMENDATION: LEFT diagnostic mammogram with possible LEFT breast
ultrasound in 6 months.
BI-RADS CATEGORY: 3: Probably benign.
CLINICAL DATA: Patient was called back from screening mammogram for
possible asymmetry in the left breast.
EXAM:
DIGITAL DIAGNOSTIC LEFT MAMMOGRAM WITH TOMO
ULTRASOUND LEFT BREAST

[L MLO synth-2D]
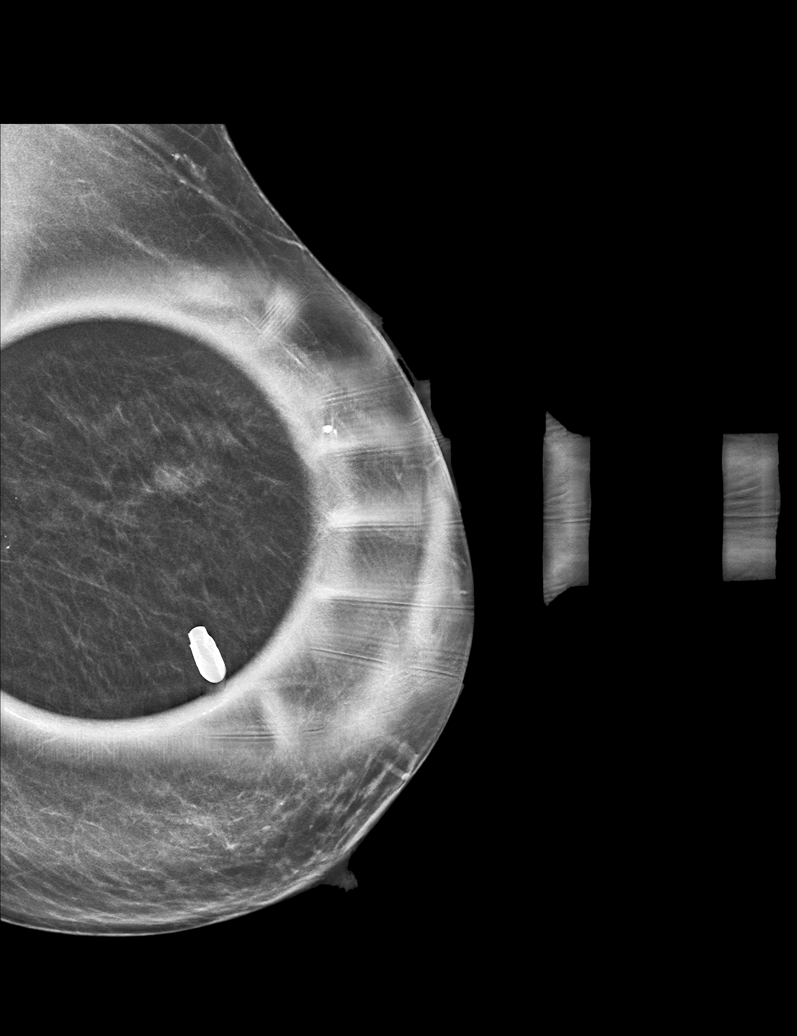

[L CC synth-2D]
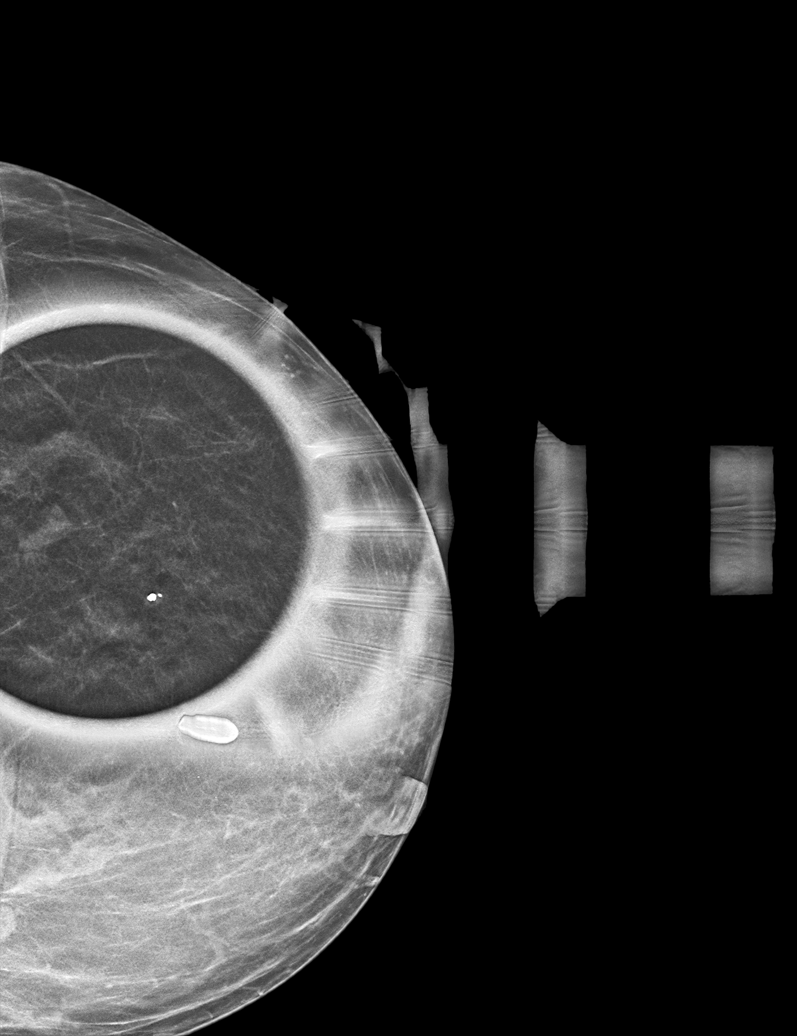

[L ML synth-2D]
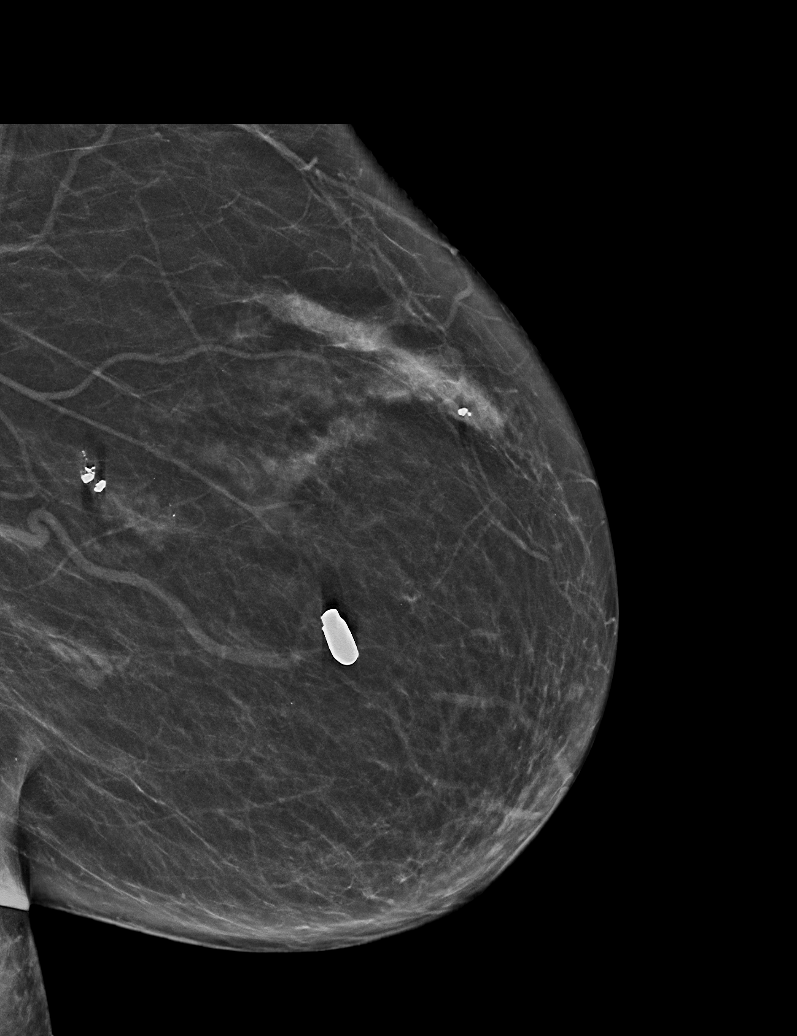

[L ML tomo · tomo slice 30/59.0]
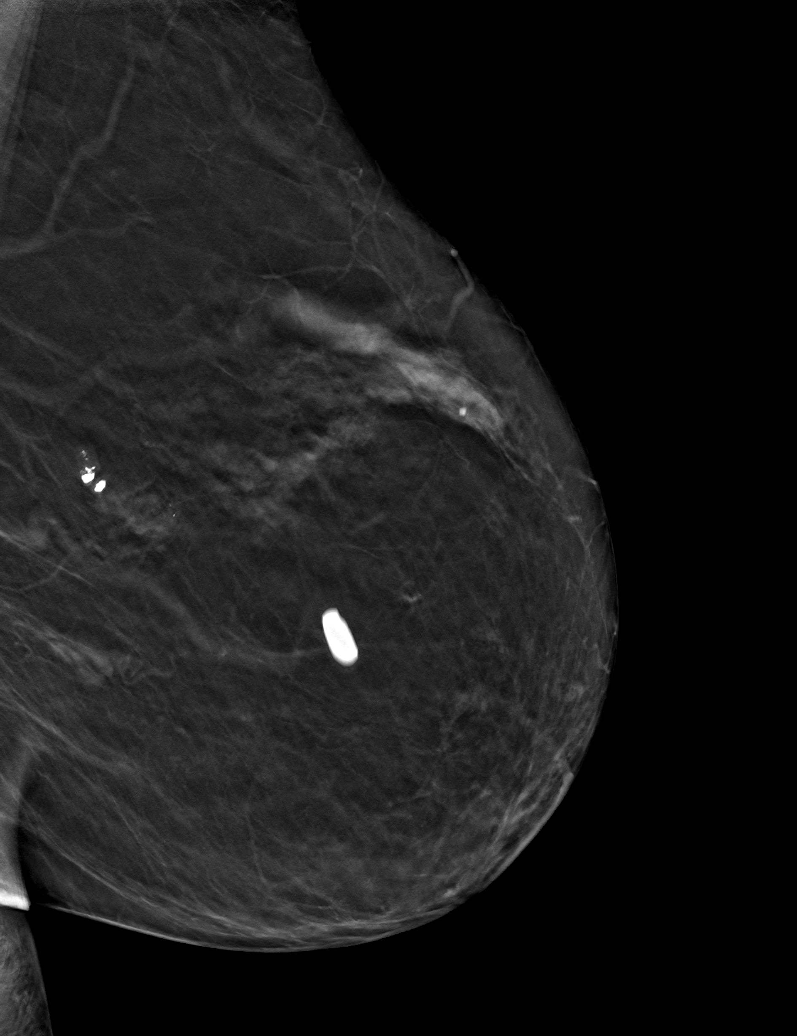

[L MLO tomo · tomo slice 24/47.0]
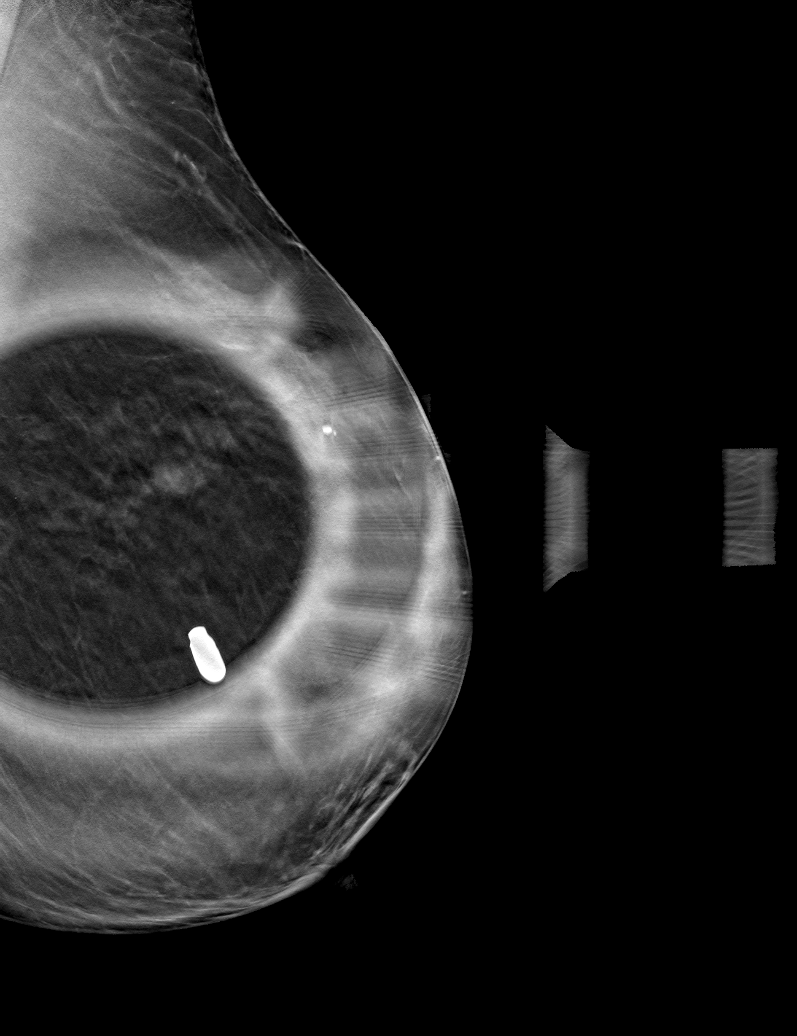

[L CC tomo · tomo slice 21/40.0]
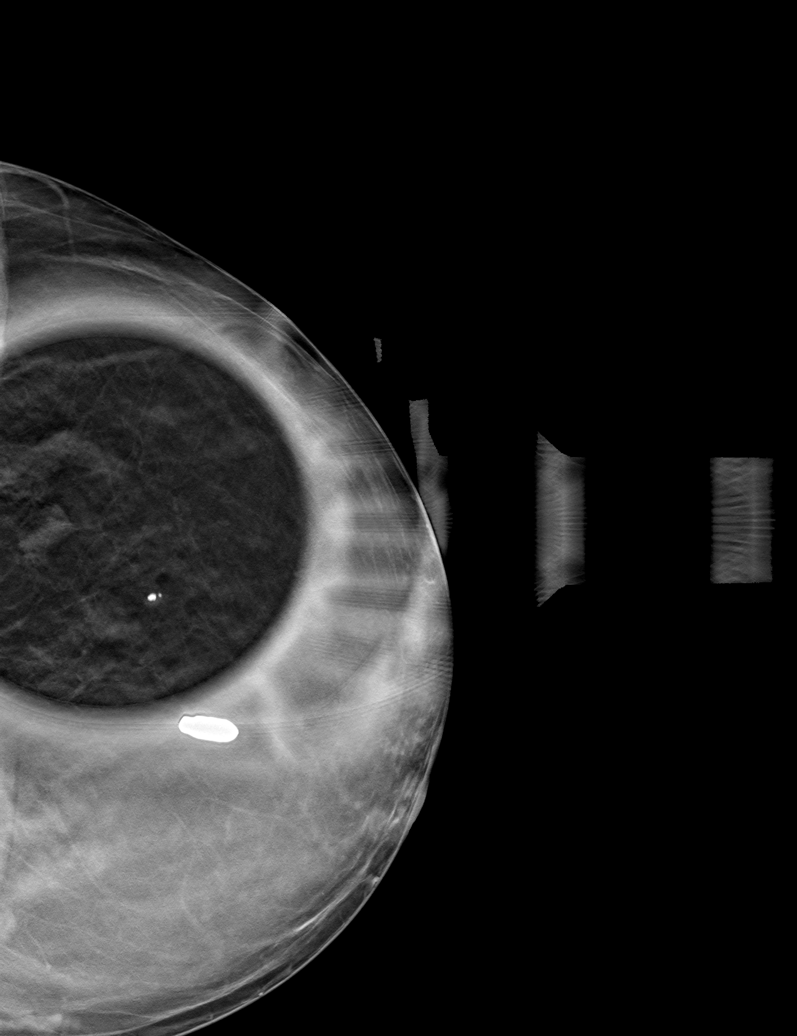

[6 of 18 positions shown; findings below may reference images not displayed]

ACR Breast Density Category b: There are scattered areas of
fibroglandular density.
FINDINGS: Additional imaging of the left breast was performed. There is
persistence of a developing asymmetry in the upper-outer quadrant of
the breast. There are no malignant type microcalcifications.

On physical exam, I do not palpate a mass in the upper-outer
quadrant of the left breast.

Targeted ultrasound is performed, showing normal tissue in the
upper-outer quadrant of the left breast. No solid or cystic mass,
abnormal shadowing or distortion visualized. Sonographic evaluation
the left axilla does not show any enlarged adenopathy.
IMPRESSION: Indeterminate developing asymmetry in the left breast.

RECOMMENDATION:
Stereotactic biopsy the left breast asymmetry is recommended.

I have discussed the findings and recommendations with the patient.
Results were also provided in writing at the conclusion of the
visit. If applicable, a reminder letter will be sent to the patient
regarding the next appointment.

BI-RADS CATEGORY  4: Suspicious.

## 2018-12-27 ENCOUNTER — Other Ambulatory Visit: Payer: Self-pay

## 2018-12-27 ENCOUNTER — Telehealth: Payer: Self-pay

## 2018-12-27 DIAGNOSIS — D519 Vitamin B12 deficiency anemia, unspecified: Secondary | ICD-10-CM

## 2018-12-27 MED ORDER — CYANOCOBALAMIN 1000 MCG/ML IJ SOLN: 1000.0000 ug | INTRAMUSCULAR | 12 refills | Status: DC

## 2018-12-27 NOTE — Telephone Encounter (Signed)
Patient calls wanting to know if Dr. Burr Medico wants her to continue the B12 injections, she does not have a follow up until November.  [phone]

## 2018-12-27 NOTE — Telephone Encounter (Signed)
Yes, she needs to continue B12 10106mcg injection every 2 weeks at home, please call in to her pharmacy, thanks.   Truitt Merle MD

## 2018-12-27 NOTE — Telephone Encounter (Signed)
Spoke to patient to explain to her Dr. Burr Medico wants her to do the B12 injection every 2 weeks, I am sending in a script to reflect this.  She verbalized an understanding.

## 2018-12-28 ENCOUNTER — Other Ambulatory Visit: Payer: Self-pay | Admitting: Gastroenterology

## 2018-12-31 IMAGING — CT CT ABD-PELV W/ CM
2 of 5 series · 16 of 46 positions shown, 18 images · IV contrast (agent unspecified)
Comparison: 07/06/2017

CLINICAL DATA: Generalized abdominal pain and decreased appetite

EXAM:
CT ABDOMEN AND PELVIS WITH CONTRAST
TECHNIQUE: Multidetector CT imaging of the abdomen and pelvis was performed
using the standard protocol following bolus administration of
intravenous contrast.
CONTRAST:  75 mL [id number]

[Series 3: abd/ pelvis 5.0 i30f 2 · axial · 0.66mm/px · z∈[+922,+1287]mm · 13 of 83 slices shown, 15 images]
[im 5/83  soft-tissue]
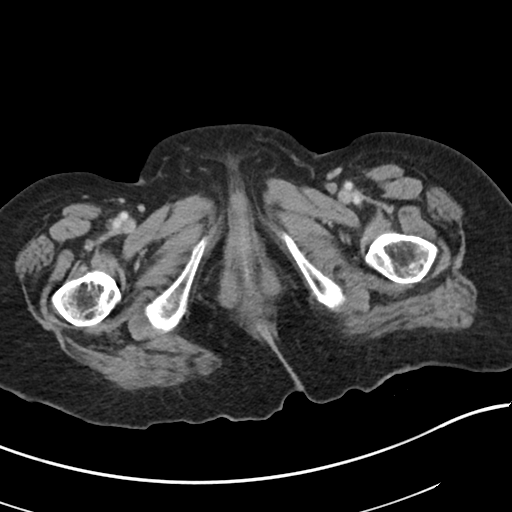
[im 5/83  bone]
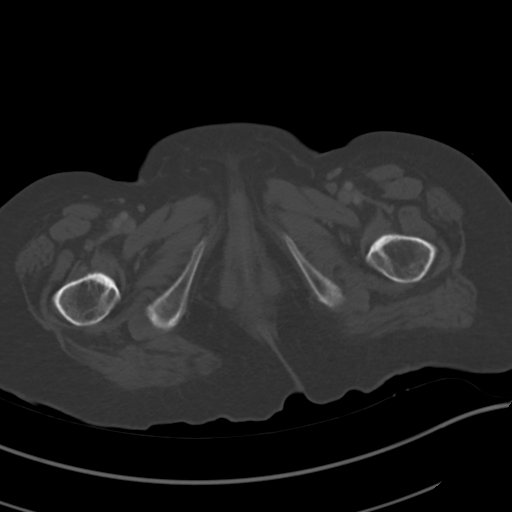
[im 13/83  soft-tissue]
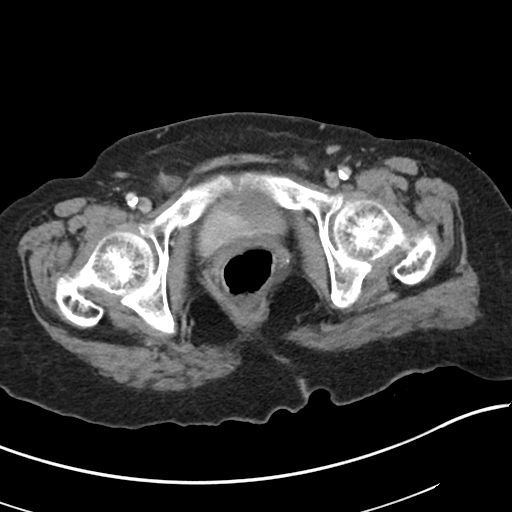
[im 17/83  soft-tissue]
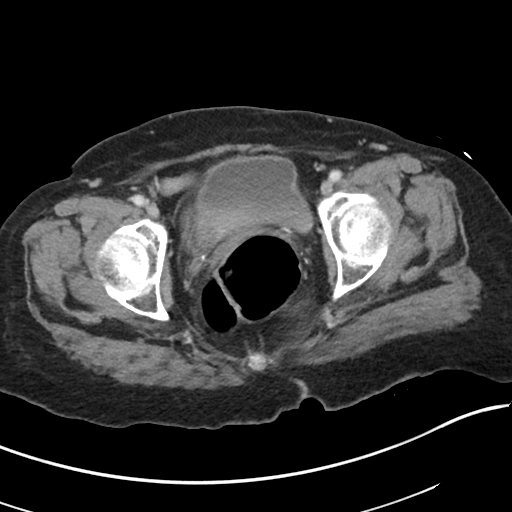
[im 25/83  soft-tissue]
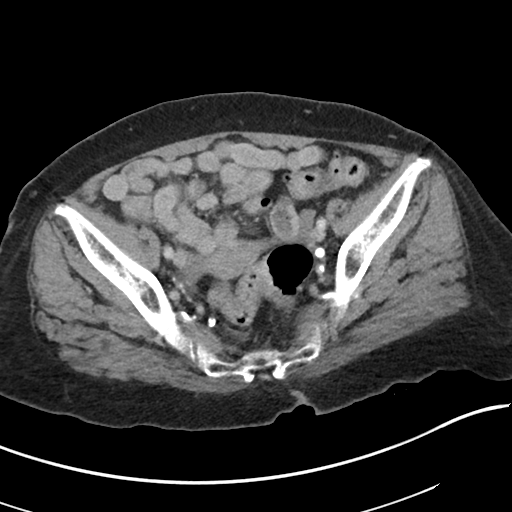
[im 29/83  soft-tissue]
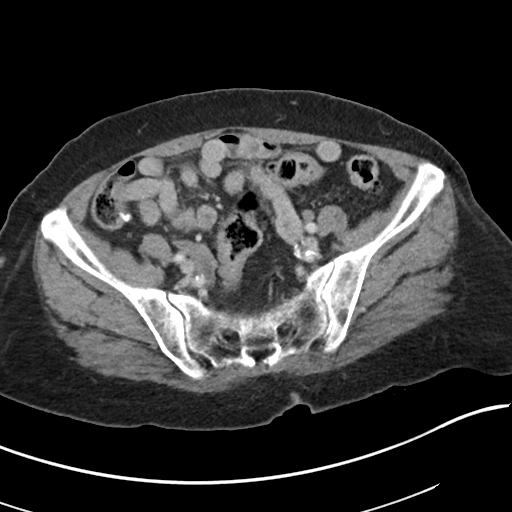
[im 37/83  soft-tissue]
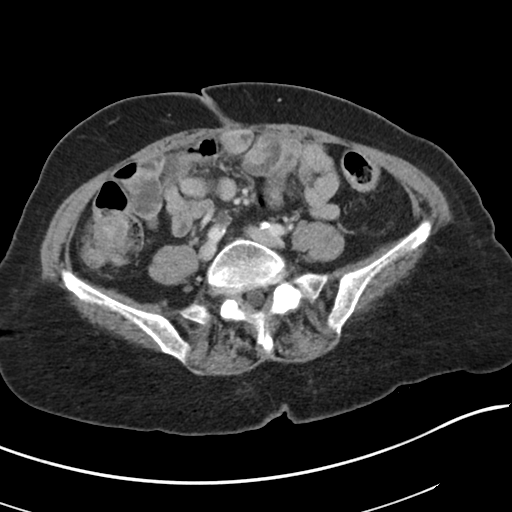
[im 42/83  soft-tissue]
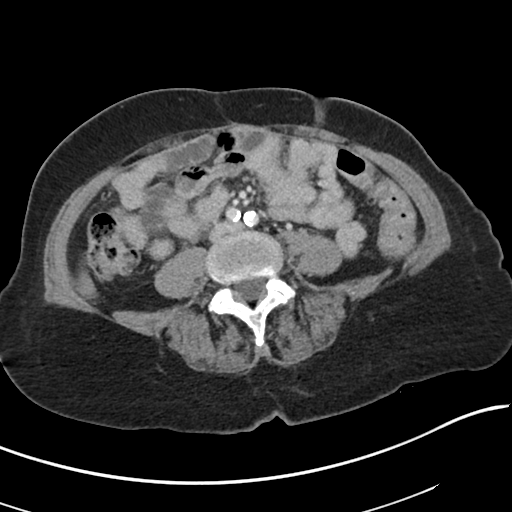
[im 46/83  soft-tissue]
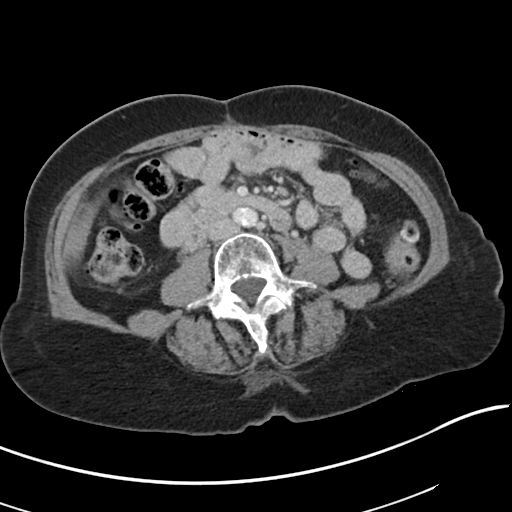
[im 54/83  soft-tissue]
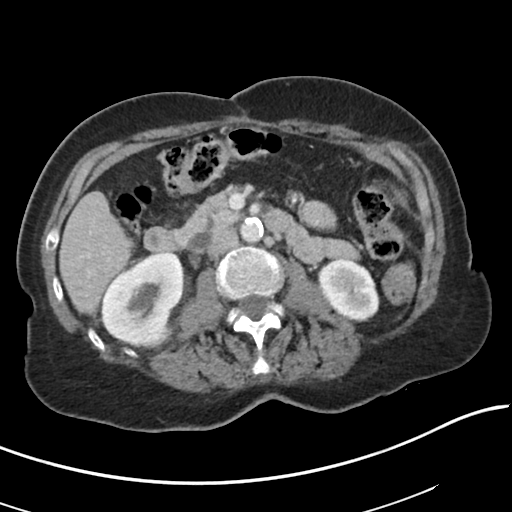
[im 54/83  bone]
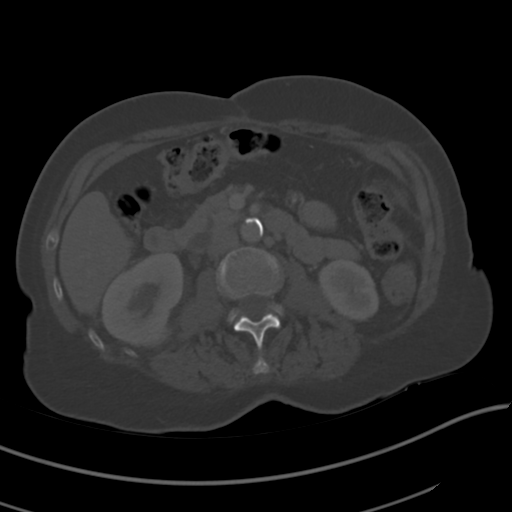
[im 58/83  soft-tissue]
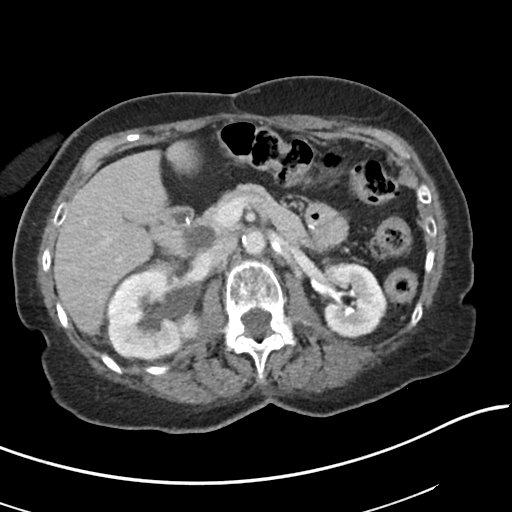
[im 66/83  soft-tissue]
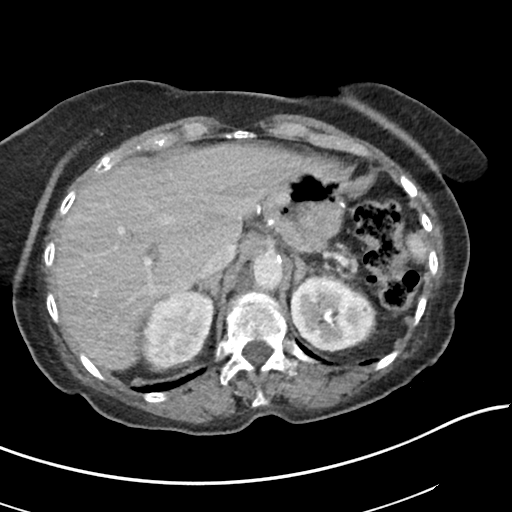
[im 70/83  soft-tissue]
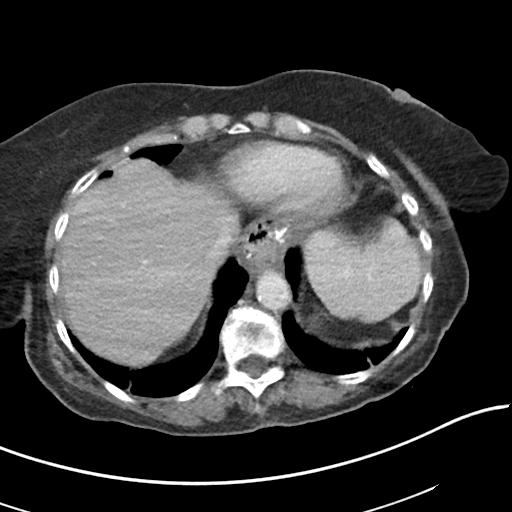
[im 78/83  soft-tissue]
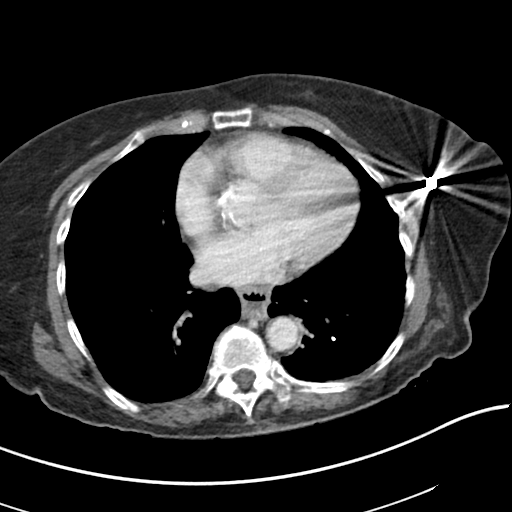

[Series 6: coronal soft tissue · coronal · 0.73mm/px · 3 of 94 slices shown]
[im 32/94  soft-tissue]
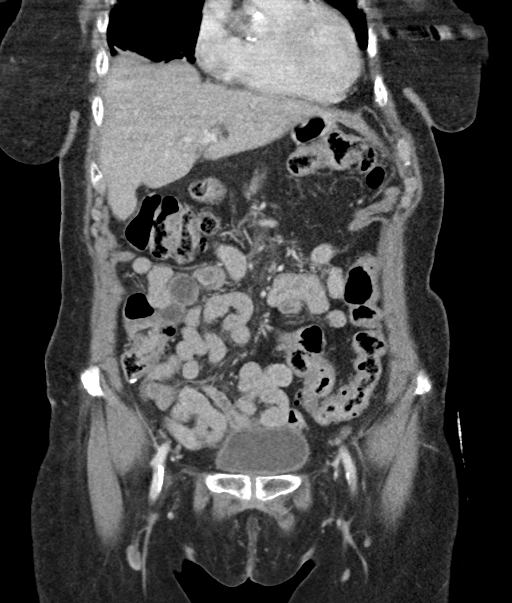
[im 42/94  soft-tissue]
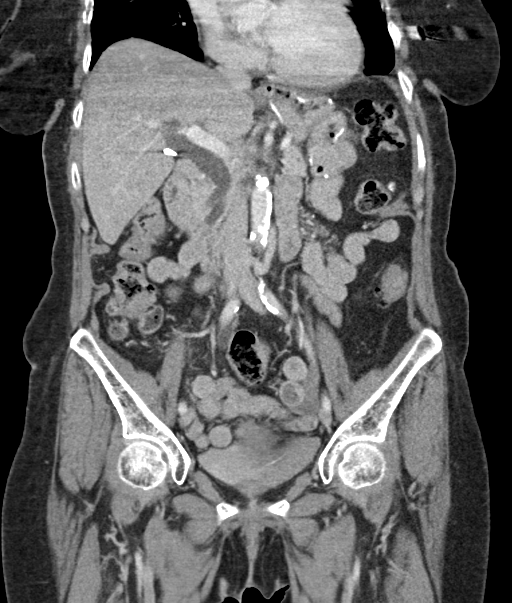
[im 52/94  soft-tissue]
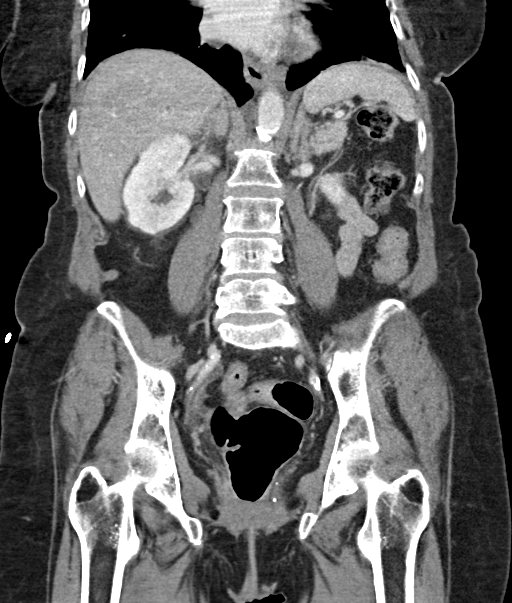

[16 of 46 positions shown; findings below may reference images not displayed]

FINDINGS: Lower chest: Some scarring is noted in the lower lobes bilaterally.
This is stable from the prior exam.

Hepatobiliary: Gallbladder is been surgically removed. Liver is
stable in appearance.

Pancreas: The pancreas itself is within normal limits. The central
pancreatic duct is mildly prominent stable from the prior exam.

Spleen: Normal in size without focal abnormality.

Adrenals/Urinary Tract: Adrenal glands are within normal limits
bilaterally. Left kidney is unremarkable. The right kidney shows
nonobstructing renal stones similar to that seen on the prior exam.
Additionally some hydro nephrosis and hydroureter is noted. This
extends distally to just above the ureterovesical junction. There
has been migration of 2 calculi into the distal ureter. One lies
just above the ureterovesical junction and measures approximately 5
mm in dimension. A second lies just superior to this measuring 5 mm
as well. Bladder is partially distended.

Stomach/Bowel: Scattered diverticular change of the colon is noted
without evidence of diverticulitis. The appendix has been surgically
removed. Previously seen fullness in the region of the previous
appendectomy half is no longer identified.

Vascular/Lymphatic: Aortic atherosclerosis. No enlarged abdominal or
pelvic lymph nodes.

Reproductive: Uterus and bilateral adnexa are unremarkable.

Other: Changes related to prior jejunostomy tract are noted in the
left mid abdomen. No free fluid is seen.

Musculoskeletal: Degenerative changes are noted. No compression
deformity is seen.

## 2019-01-23 ENCOUNTER — Ambulatory Visit (INDEPENDENT_AMBULATORY_CARE_PROVIDER_SITE_OTHER): Payer: Medicare Other | Admitting: Gastroenterology

## 2019-01-23 ENCOUNTER — Encounter: Payer: Self-pay | Admitting: Gastroenterology

## 2019-01-23 VITALS — BP 108/76 | HR 67 | Temp 97.2°F | Ht 63.0 in | Wt 127.0 lb

## 2019-01-23 DIAGNOSIS — R1312 Dysphagia, oropharyngeal phase: Secondary | ICD-10-CM | POA: Diagnosis not present

## 2019-01-23 DIAGNOSIS — R1084 Generalized abdominal pain: Secondary | ICD-10-CM

## 2019-01-23 DIAGNOSIS — K219 Gastro-esophageal reflux disease without esophagitis: Secondary | ICD-10-CM | POA: Diagnosis not present

## 2019-01-23 DIAGNOSIS — K582 Mixed irritable bowel syndrome: Secondary | ICD-10-CM

## 2019-01-23 MED ORDER — LINACLOTIDE 72 MCG PO CAPS: 72.0000 ug | ORAL_CAPSULE | Freq: Every day | ORAL | 3 refills | Status: DC

## 2019-01-23 NOTE — Patient Instructions (Signed)
You have been scheduled for a FEES swallowing study at [facility] on 01/31/2019 at 10am, Go to admissions at [facility] to register by 9:45am    We have sent Linzess to your pharmacy  Take Fiberchoice 1 tablet daily  Follow up in 4 months  I appreciate the  opportunity to care for you  Thank You   Harl Bowie , MD
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
## 2019-01-24 ENCOUNTER — Ambulatory Visit (INDEPENDENT_AMBULATORY_CARE_PROVIDER_SITE_OTHER): Payer: Medicare Other | Admitting: Podiatry

## 2019-01-24 ENCOUNTER — Encounter: Payer: Self-pay | Admitting: Podiatry

## 2019-01-24 ENCOUNTER — Other Ambulatory Visit: Payer: Self-pay

## 2019-01-24 DIAGNOSIS — I10 Essential (primary) hypertension: Secondary | ICD-10-CM

## 2019-01-24 DIAGNOSIS — B351 Tinea unguium: Secondary | ICD-10-CM

## 2019-01-24 DIAGNOSIS — E119 Type 2 diabetes mellitus without complications: Secondary | ICD-10-CM

## 2019-01-24 DIAGNOSIS — M79674 Pain in right toe(s): Secondary | ICD-10-CM | POA: Diagnosis not present

## 2019-01-24 DIAGNOSIS — M79675 Pain in left toe(s): Secondary | ICD-10-CM

## 2019-01-24 NOTE — Progress Notes (Signed)
Complaint:  Visit Type: Patient returns to my office for continued preventative foot care services. Complaint: Patient states" my nails have grown long and thick and become painful to walk and wear shoes" Patient has been diagnosed with DM with neuropathy.. The patient presents for preventative foot care services. No changes to ROS  Podiatric Exam: Vascular: dorsalis pedis and posterior tibial pulses are palpable bilateral. Capillary return is immediate. Temperature gradient is WNL. Skin turgor WNL  Sensorium: Diminished  Semmes Weinstein monofilament test. Normal tactile sensation bilaterally. Nail Exam: Pt has thick disfigured discolored nails with subungual debris noted bilateral entire nail hallux through fifth toenails Ulcer Exam: There is no evidence of ulcer or pre-ulcerative changes or infection. Orthopedic Exam: Muscle tone and strength are WNL. No limitations in general ROM. No crepitus or effusions noted. Foot type and digits show no abnormalities. Bony prominences are unremarkable. Skin: No Porokeratosis. No infection or ulcers.  Peeling noted plantar aspect feet  B/L.  Diagnosis:  Onychomycosis, , Pain in right toe, pain in left toes  Dermatitis feet  B/L.  Treatment & Plan Procedures and Treatment: Consent by patient was obtained for treatment procedures.   Debridement of mycotic and hypertrophic toenails, 1 through 5 bilateral and clearing of subungual debris. No ulceration, no infection noted. Prescribe lac-hydrin. Return Visit-Office Procedure: Patient instructed to return to the office for a follow up visit 10 weeks  for continued evaluation and treatment.    Darrie Macmillan DPM 

## 2019-01-31 ENCOUNTER — Other Ambulatory Visit: Payer: Self-pay

## 2019-01-31 ENCOUNTER — Ambulatory Visit (HOSPITAL_COMMUNITY)
Admission: RE | Admit: 2019-01-31 | Discharge: 2019-01-31 | Disposition: A | Discharge status: Home / Self Care | Payer: Medicare Other | Source: Ambulatory Visit | Attending: Gastroenterology | Admitting: Gastroenterology

## 2019-01-31 DIAGNOSIS — R1312 Dysphagia, oropharyngeal phase: Secondary | ICD-10-CM | POA: Diagnosis not present

## 2019-01-31 DIAGNOSIS — K219 Gastro-esophageal reflux disease without esophagitis: Secondary | ICD-10-CM
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
## 2019-02-03 ENCOUNTER — Telehealth: Payer: Self-pay | Admitting: Gastroenterology

## 2019-02-03 NOTE — Telephone Encounter (Signed)
Pt reported that she had swallowing test done at hospital 01/31/19 and was told that she needed an EGD.  Please advise on scheduling.

## 2019-02-06 NOTE — Telephone Encounter (Signed)
Please advise. She understands the test results have to be reviewed by the provider before recommendations can be made.

## 2019-02-06 NOTE — Telephone Encounter (Signed)
Pt reported that she has been experiencing dysphagia and urge to vomit after swallowing test.  Please advise.

## 2019-02-07 NOTE — Telephone Encounter (Signed)
Patient agrees to this plan. She will come to the office for her nurse visit on 02/16/19 at 12:30 pm per Overton Mam, RN.  Her procedure date is 02/22/19 at 2:00 pm. Arrive at 1:00 pm with her support/care partner.  Advised both of them will be required to wear a mask, submit to a temperature screen, be able to call the transportation back for pick up.

## 2019-02-07 NOTE — Telephone Encounter (Signed)
She has oropharyngeal dysphagia based on FEES study but they felt they cannot exclude esophageal stricture will need further evaluation. Please schedule EGD to exclude esophageal stricture and dilation as needed. Thanks

## 2019-02-16 ENCOUNTER — Other Ambulatory Visit: Payer: Self-pay

## 2019-02-16 ENCOUNTER — Ambulatory Visit (AMBULATORY_SURGERY_CENTER): Payer: Self-pay | Admitting: *Deleted

## 2019-02-16 VITALS — Temp 96.9°F | Ht 63.0 in | Wt 127.2 lb

## 2019-02-16 DIAGNOSIS — R1312 Dysphagia, oropharyngeal phase: Secondary | ICD-10-CM

## 2019-02-16 DIAGNOSIS — K219 Gastro-esophageal reflux disease without esophagitis: Secondary | ICD-10-CM

## 2019-02-16 NOTE — Progress Notes (Signed)
No egg or soy allergy known to patient  No issues with past sedation with any surgeries  or procedures, no intubation problems  No diet pills per patient No home 02 use per patient  No blood thinners per patient  Pt denies issues with constipation  No A fib or A flutter  EMMI video sent to pt's e mail   Due to the COVID-19 pandemic we are asking patients to follow these guidelines. Please only bring one care partner. Please be aware that your care partner may wait in the car in the parking lot or if they feel like they will be too hot to wait in the car, they may wait in the lobby on the 4th floor. All care partners are required to wear a mask the entire time (we do not have any that we can provide them), they need to practice social distancing, and we will do a Covid check for all patient's and care partners when you arrive. Also we will check their temperature and your temperature. If the care partner waits in their car they need to stay in the parking lot the entire time and we will call them on their cell phone when the patient is ready for discharge so they can bring the car to the front of the building. Also all patient's will need to wear a mask into building.  Pt.declines covid screening,stated "I just had it a couple months ago and everything was okay."  Pt.uses a rolling walker for assistance.

## 2019-02-21 ENCOUNTER — Telehealth: Payer: Self-pay

## 2019-02-21 NOTE — Telephone Encounter (Signed)
Covid-19 screening questions   Do you now or have you had a fever in the last 14 days? NO   Do you have any respiratory symptoms of shortness of breath or cough now or in the last 14 days? NO  Do you have any family members or close contacts with diagnosed or suspected Covid-19 in the past 14 days? NO  Have you been tested for Covid-19 and found to be positive? NO        

## 2019-02-22 ENCOUNTER — Other Ambulatory Visit: Payer: Self-pay

## 2019-02-22 ENCOUNTER — Encounter: Payer: Self-pay | Admitting: Gastroenterology

## 2019-02-22 ENCOUNTER — Ambulatory Visit: Payer: Medicare Other | Admitting: Gastroenterology

## 2019-02-22 VITALS — BP 138/69 | HR 74 | Temp 98.6°F | Ht 63.0 in | Wt 127.2 lb

## 2019-02-22 MED ORDER — SODIUM CHLORIDE 0.9 % IV SOLN: 500.0000 mL | Freq: Once | INTRAVENOUS | Status: DC

## 2019-02-22 NOTE — Progress Notes (Signed)
Pt arrived for endoscopy, IV status is poor and pt had numerous IV attempts to obtain venous access, all unsuccessful.  Pt tolerated well, however, unable to have procedure due to inability to gain IV access.  2 attempts per Christell Constant, RN, R lateral AC and L AC.  1 attempt to R inner middle forearm per Etheleen Nicks, RN.  L hand attempt per Salome Arnt RN, 1 attempt per Kassie Mends CRNA to R lateral forearm.  2- Right arm, 2- left arm, 1- left hand and one right inner wrist attempts by Lafe Garin CRNA.  Dr Silverio Decamp spoke with pt and decided to reschedule her at the hospital, states her nurse will call pt and reschedule.  Pt daughter informed, pt verb understanding and is calm when discharged.  Pt aware to call Dr Silverio Decamp with any questions or problems.

## 2019-02-22 NOTE — Progress Notes (Signed)
Pt's states no medical or surgical changes since previsit or office visit.  KA - temp CW - vitals 

## 2019-02-23 ENCOUNTER — Other Ambulatory Visit: Payer: Self-pay | Admitting: Gastroenterology

## 2019-02-24 ENCOUNTER — Other Ambulatory Visit: Payer: Self-pay

## 2019-02-24 ENCOUNTER — Telehealth: Payer: Self-pay

## 2019-02-24 ENCOUNTER — Other Ambulatory Visit (HOSPITAL_COMMUNITY)
Admission: RE | Admit: 2019-02-24 | Discharge: 2019-02-24 | Disposition: A | Discharge status: Home / Self Care | Payer: Medicare Other | Source: Ambulatory Visit | Attending: Gastroenterology | Admitting: Gastroenterology

## 2019-02-24 DIAGNOSIS — Z01812 Encounter for preprocedural laboratory examination: Secondary | ICD-10-CM | POA: Diagnosis present

## 2019-02-24 DIAGNOSIS — Z20828 Contact with and (suspected) exposure to other viral communicable diseases: Secondary | ICD-10-CM | POA: Diagnosis not present

## 2019-02-24 DIAGNOSIS — R1312 Dysphagia, oropharyngeal phase: Secondary | ICD-10-CM

## 2019-02-24 DIAGNOSIS — I878 Other specified disorders of veins: Secondary | ICD-10-CM

## 2019-02-24 DIAGNOSIS — Z1159 Encounter for screening for other viral diseases: Secondary | ICD-10-CM

## 2019-02-24 NOTE — Telephone Encounter (Signed)
The patient was able to get her COVID test and arrange transportation. She is scheduled for her EGD with possible dilation on 02/28/19 arrive at 11:15 am. Instructed to begin the fasting at 8:30 am. Light supper the evening before and clear liquids after 8 pm.

## 2019-02-24 NOTE — Telephone Encounter (Signed)
Called the patient and offered a hospital procedure date of 02/28/19. Patient states transportation is difficult. She would like to ask her daughter before turning doem this appointment. Understands she has to go for COVID screening today if she wants to take the 02/28/19 procedure date. Patient agrees to call ma with her decision.

## 2019-02-25 LAB — NOVEL CORONAVIRUS, NAA (HOSP ORDER, SEND-OUT TO REF LAB; TAT 18-24 HRS): SARS-CoV-2, NAA: NOT DETECTED

## 2019-02-27 ENCOUNTER — Other Ambulatory Visit: Payer: Self-pay

## 2019-02-27 ENCOUNTER — Other Ambulatory Visit: Payer: Self-pay | Admitting: Gastroenterology

## 2019-02-27 ENCOUNTER — Encounter (HOSPITAL_COMMUNITY): Payer: Self-pay | Admitting: *Deleted

## 2019-02-27 NOTE — Progress Notes (Signed)
Spoke with patient, has quarantined without symptoms, answered questions, arrival time is 1115 tomorrow.

## 2019-02-28 ENCOUNTER — Encounter (HOSPITAL_COMMUNITY): Payer: Self-pay | Admitting: *Deleted

## 2019-02-28 ENCOUNTER — Encounter (HOSPITAL_COMMUNITY)
Admission: RE | Disposition: A | Discharge status: Home / Self Care | Payer: Self-pay | Source: Home / Self Care | Attending: Gastroenterology

## 2019-02-28 ENCOUNTER — Ambulatory Visit (HOSPITAL_COMMUNITY): Payer: Medicare Other | Admitting: Certified Registered Nurse Anesthetist

## 2019-02-28 ENCOUNTER — Ambulatory Visit (HOSPITAL_COMMUNITY)
Admission: RE | Admit: 2019-02-28 | Discharge: 2019-02-28 | Disposition: A | Discharge status: Home / Self Care | Payer: Medicare Other | Attending: Gastroenterology | Admitting: Gastroenterology

## 2019-02-28 DIAGNOSIS — F17210 Nicotine dependence, cigarettes, uncomplicated: Secondary | ICD-10-CM | POA: Insufficient documentation

## 2019-02-28 DIAGNOSIS — I4510 Unspecified right bundle-branch block: Secondary | ICD-10-CM | POA: Insufficient documentation

## 2019-02-28 DIAGNOSIS — Z9049 Acquired absence of other specified parts of digestive tract: Secondary | ICD-10-CM | POA: Insufficient documentation

## 2019-02-28 DIAGNOSIS — Z7984 Long term (current) use of oral hypoglycemic drugs: Secondary | ICD-10-CM | POA: Insufficient documentation

## 2019-02-28 DIAGNOSIS — F329 Major depressive disorder, single episode, unspecified: Secondary | ICD-10-CM | POA: Diagnosis not present

## 2019-02-28 DIAGNOSIS — Z8249 Family history of ischemic heart disease and other diseases of the circulatory system: Secondary | ICD-10-CM | POA: Insufficient documentation

## 2019-02-28 DIAGNOSIS — K589 Irritable bowel syndrome without diarrhea: Secondary | ICD-10-CM | POA: Insufficient documentation

## 2019-02-28 DIAGNOSIS — I700 Atherosclerosis of aorta: Secondary | ICD-10-CM | POA: Diagnosis not present

## 2019-02-28 DIAGNOSIS — K219 Gastro-esophageal reflux disease without esophagitis: Secondary | ICD-10-CM | POA: Diagnosis not present

## 2019-02-28 DIAGNOSIS — Z86718 Personal history of other venous thrombosis and embolism: Secondary | ICD-10-CM | POA: Insufficient documentation

## 2019-02-28 DIAGNOSIS — X58XXXA Exposure to other specified factors, initial encounter: Secondary | ICD-10-CM | POA: Insufficient documentation

## 2019-02-28 DIAGNOSIS — F419 Anxiety disorder, unspecified: Secondary | ICD-10-CM | POA: Insufficient documentation

## 2019-02-28 DIAGNOSIS — E1143 Type 2 diabetes mellitus with diabetic autonomic (poly)neuropathy: Secondary | ICD-10-CM | POA: Diagnosis not present

## 2019-02-28 DIAGNOSIS — R1310 Dysphagia, unspecified: Secondary | ICD-10-CM | POA: Diagnosis not present

## 2019-02-28 DIAGNOSIS — Q396 Congenital diverticulum of esophagus: Secondary | ICD-10-CM | POA: Insufficient documentation

## 2019-02-28 DIAGNOSIS — Z79899 Other long term (current) drug therapy: Secondary | ICD-10-CM | POA: Insufficient documentation

## 2019-02-28 DIAGNOSIS — G4730 Sleep apnea, unspecified: Secondary | ICD-10-CM | POA: Insufficient documentation

## 2019-02-28 DIAGNOSIS — K228 Other specified diseases of esophagus: Secondary | ICD-10-CM

## 2019-02-28 DIAGNOSIS — K449 Diaphragmatic hernia without obstruction or gangrene: Secondary | ICD-10-CM | POA: Insufficient documentation

## 2019-02-28 DIAGNOSIS — I10 Essential (primary) hypertension: Secondary | ICD-10-CM | POA: Diagnosis not present

## 2019-02-28 DIAGNOSIS — R1312 Dysphagia, oropharyngeal phase: Secondary | ICD-10-CM

## 2019-02-28 DIAGNOSIS — Z87442 Personal history of urinary calculi: Secondary | ICD-10-CM | POA: Diagnosis not present

## 2019-02-28 DIAGNOSIS — K3184 Gastroparesis: Secondary | ICD-10-CM | POA: Diagnosis not present

## 2019-02-28 DIAGNOSIS — T183XXA Foreign body in small intestine, initial encounter: Secondary | ICD-10-CM | POA: Insufficient documentation

## 2019-02-28 DIAGNOSIS — Z903 Acquired absence of stomach [part of]: Secondary | ICD-10-CM | POA: Diagnosis not present

## 2019-02-28 DIAGNOSIS — G8929 Other chronic pain: Secondary | ICD-10-CM

## 2019-02-28 DIAGNOSIS — Z1159 Encounter for screening for other viral diseases: Secondary | ICD-10-CM

## 2019-02-28 DIAGNOSIS — R1013 Epigastric pain: Secondary | ICD-10-CM

## 2019-02-28 DIAGNOSIS — Z980 Intestinal bypass and anastomosis status: Secondary | ICD-10-CM | POA: Insufficient documentation

## 2019-02-28 DIAGNOSIS — E559 Vitamin D deficiency, unspecified: Secondary | ICD-10-CM | POA: Diagnosis not present

## 2019-02-28 DIAGNOSIS — Z791 Long term (current) use of non-steroidal anti-inflammatories (NSAID): Secondary | ICD-10-CM | POA: Insufficient documentation

## 2019-02-28 DIAGNOSIS — Z800 Family history of malignant neoplasm of digestive organs: Secondary | ICD-10-CM | POA: Insufficient documentation

## 2019-02-28 HISTORY — PX: ESOPHAGOGASTRODUODENOSCOPY (EGD) WITH PROPOFOL: SHX5813

## 2019-02-28 SURGERY — ESOPHAGOGASTRODUODENOSCOPY (EGD) WITH PROPOFOL
Anesthesia: Monitor Anesthesia Care

## 2019-02-28 MED ORDER — PROPOFOL 500 MG/50ML IV EMUL
INTRAVENOUS | Status: AC
  Filled 2019-02-28: qty 50

## 2019-02-28 MED ORDER — ESOMEPRAZOLE MAGNESIUM 40 MG PO CPDR: 40.0000 mg | DELAYED_RELEASE_CAPSULE | Freq: Two times a day (BID) | ORAL | 1 refills | Status: DC

## 2019-02-28 MED ORDER — SODIUM CHLORIDE 0.9 % IV SOLN: INTRAVENOUS | Status: DC

## 2019-02-28 MED ORDER — PROPOFOL 10 MG/ML IV BOLUS
INTRAVENOUS | Status: DC | PRN
  Administered 2019-02-28: 20 mg via INTRAVENOUS
  Administered 2019-02-28: 40 mg via INTRAVENOUS
  Administered 2019-02-28: 30 mg via INTRAVENOUS
  Administered 2019-02-28: 10 mg via INTRAVENOUS

## 2019-02-28 MED ORDER — LACTATED RINGERS IV SOLN
INTRAVENOUS | Status: DC | PRN
  Administered 2019-02-28: 13:00:00 via INTRAVENOUS

## 2019-02-28 MED ORDER — PROPOFOL 500 MG/50ML IV EMUL
INTRAVENOUS | Status: DC | PRN
  Administered 2019-02-28: 100 ug/kg/min via INTRAVENOUS

## 2019-02-28 MED ORDER — LIDOCAINE 2% (20 MG/ML) 5 ML SYRINGE
INTRAMUSCULAR | Status: DC | PRN
  Administered 2019-02-28: 40 mg via INTRAVENOUS

## 2019-02-28 SURGICAL SUPPLY — 15 items

## 2019-02-28 NOTE — Anesthesia Postprocedure Evaluation (Signed)
Anesthesia Post Note  Patient: Emily [name]  Procedure(s) Performed: ESOPHAGOGASTRODUODENOSCOPY (EGD) WITH PROPOFOL (N/A )     Patient location during evaluation: PACU Anesthesia Type: MAC Level of consciousness: awake and alert Pain management: pain level controlled Vital Signs Assessment: post-procedure vital signs reviewed and stable Respiratory status: spontaneous breathing, nonlabored ventilation, respiratory function stable and patient connected to nasal cannula oxygen Cardiovascular status: stable and blood pressure returned to baseline Postop Assessment: no apparent nausea or vomiting Anesthetic complications: no    Last Vitals:  Vitals:   02/28/19 1330 02/28/19 1340  BP: (!) 138/55 (!) 172/55  Pulse: 75 75  Resp: 16 19  Temp:    SpO2: 98% 96%    Last Pain:  Vitals:   02/28/19 1321  TempSrc: Temporal  PainSc: 0-No pain                 Rhianne Soman DAVID

## 2019-02-28 NOTE — Anesthesia Preprocedure Evaluation (Signed)
Anesthesia Evaluation  Patient identified by MRN, date of birth, ID band Patient awake    Reviewed: Allergy & Precautions, NPO status , Patient's Chart, lab work & pertinent test results  Airway Mallampati: I  TM Distance: >3 FB Neck ROM: Full    Dental   Pulmonary Current Smoker,    Pulmonary exam normal        Cardiovascular hypertension, Pt. on medications Normal cardiovascular exam     Neuro/Psych Anxiety Depression    GI/Hepatic GERD  Medicated and Controlled,  Endo/Other  diabetes, Type 2, Oral Hypoglycemic Agents  Renal/GU      Musculoskeletal   Abdominal   Peds  Hematology   Anesthesia Other Findings   Reproductive/Obstetrics                             Anesthesia Physical Anesthesia Plan  ASA: III  Anesthesia Plan: MAC   Post-op Pain Management:    Induction: Intravenous  PONV Risk Score and Plan: 1 and Ondansetron  Airway Management Planned: Simple Face Mask  Additional Equipment:   Intra-op Plan:   Post-operative Plan:   Informed Consent: I have reviewed the patients History and Physical, chart, labs and discussed the procedure including the risks, benefits and alternatives for the proposed anesthesia with the patient or authorized representative who has indicated his/her understanding and acceptance.       Plan Discussed with: CRNA and Surgeon  Anesthesia Plan Comments:         Anesthesia Quick Evaluation

## 2019-02-28 NOTE — Discharge Instructions (Signed)
YOU HAD AN ENDOSCOPIC PROCEDURE TODAY: Refer to the procedure report and other information in the discharge instructions given to you for any specific questions about what was found during the examination. If this information does not answer your questions, please call [facility] office at [phone] to clarify.   YOU SHOULD EXPECT: Some feelings of bloating in the abdomen. Passage of more gas than usual. Walking can help get rid of the air that was put into your GI tract during the procedure and reduce the bloating. If you had a lower endoscopy (such as a colonoscopy or flexible sigmoidoscopy) you may notice spotting of blood in your stool or on the toilet paper. Some abdominal soreness may be present for a day or two, also.  DIET: Your first meal following the procedure should be a light meal and then it is ok to progress to your normal diet. A half-sandwich or bowl of soup is an example of a good first meal. Heavy or fried foods are harder to digest and may make you feel nauseous or bloated. Drink plenty of fluids but you should avoid alcoholic beverages for 24 hours. If you had a esophageal dilation, please see attached instructions for diet.    ACTIVITY: Your care partner should take you home directly after the procedure. You should plan to take it easy, moving slowly for the rest of the day. You can resume normal activity the day after the procedure however YOU SHOULD NOT DRIVE, use power tools, machinery or perform tasks that involve climbing or major physical exertion for 24 hours (because of the sedation medicines used during the test).   SYMPTOMS TO REPORT IMMEDIATELY: A gastroenterologist can be reached at any hour. Please call [phone]  for any of the following symptoms:   Following upper endoscopy (EGD, EUS, ERCP, esophageal dilation) Vomiting of blood or coffee ground material  New, significant abdominal pain  New, significant chest pain or pain under the shoulder blades  Painful or  persistently difficult swallowing  New shortness of breath  Black, tarry-looking or red, bloody stools  FOLLOW UP:  If any biopsies were taken you will be contacted by phone or by letter within the next 1-3 weeks. Call [phone]  if you have not heard about the biopsies in 3 weeks.  Please also call with any specific questions about appointments or follow up tests.  
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
## 2019-02-28 NOTE — Op Note (Signed)
[facility] Patient Name: Emily [name] Procedure Date: 02/28/2019 MRN: [id number] Attending MD: Mauri Pole , MD Date of Birth: 11-23-1940 CSN: [id number] Age: 77 Admit Type: Inpatient Procedure:                Upper GI endoscopy Indications:              Dysphagia, Epigastric abdominal pain Providers:                Mauri Pole, MD, Ashley Jacobs, RN, [name], Technician Referring MD:              Medicines:                Monitored Anesthesia Care Complications:            No immediate complications. Estimated Blood Loss:     Estimated blood loss was minimal. Procedure:                Pre-Anesthesia Assessment:                           - Prior to the procedure, a History and Physical                            was performed, and patient medications and                            allergies were reviewed. The patient's tolerance of                            previous anesthesia was also reviewed. The risks                            and benefits of the procedure and the sedation                            options and risks were discussed with the patient.                            All questions were answered, and informed consent                            was obtained. Prior Anticoagulants: The patient has                            taken no previous anticoagulant or antiplatelet                            agents. ASA Grade Assessment: III - A patient with                            severe systemic disease. After reviewing the risks  and benefits, the patient was deemed in                            satisfactory condition to undergo the procedure.                           After obtaining informed consent, the endoscope was                            passed under direct vision. Throughout the                            procedure, the patient's blood pressure, pulse, and           oxygen saturations were monitored continuously. The                            [id number]) Olympus gastroscope was                            introduced through the mouth, and advanced to the                            second part of duodenum. The upper GI endoscopy was                            accomplished without difficulty. The patient                            tolerated the procedure well. Scope In: Scope Out: Findings:      A diverticulum with a small opening was found at the gastroesophageal       junction.      An esophago-jejunal anastomosis was found at the gastroesophageal       junction.      The Z-line was irregular and was found 35 cm from the incisors.      The jejunum contained a food bezoar. Removal of food was accomplished by       suction through scope.      Normal mucosa was found in the jejunum afferebt and efferent loop, no       evidence of obstruction or stenosis. 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                           - An esophago-jejunal anastomosis was found.                           - Z-line irregular, 35 cm from the incisors.                           - Foreign body in the jejunum.                           - Normal mucosa was found in the jejunum.                           -  No specimens collected. Moderate Sedation:      Not Applicable - Patient had care per Anesthesia. Recommendation:           - Patient has a contact number available for                            emergencies. The signs and symptoms of potential                            delayed complications were discussed with the                            patient. Return to normal activities tomorrow.                            Written discharge instructions were provided to the                            patient.                           - Continue present medications.                           - Gastroparesis diet indefinitely.                            - Return to GI office at the next available                            appointment. Procedure Code(s):        --- Professional ---                           [phone], Esophagogastroduodenoscopy, flexible,                            transoral; diagnostic, including collection of                            specimen(s) by brushing or washing, when performed                            (separate procedure) Diagnosis Code(s):        --- Professional ---                           Q39.6, Congenital diverticulum of esophagus                           Z98.0, Intestinal bypass and anastomosis status                           K22.8, Other specified diseases of esophagus                           T18.3XXS, Foreign body in small intestine, sequela  R13.10, Dysphagia, unspecified                           R10.13, Epigastric pain CPT copyright 2019 [employer] Association. All rights reserved. The codes documented in this report are preliminary and upon coder review may  be revised to meet current compliance requirements. Mauri Pole, MD 02/28/2019 1:24:30 PM This report has been signed electronically. Number of Addenda: 0

## 2019-02-28 NOTE — Transfer of Care (Signed)
Immediate Anesthesia Transfer of Care Note  Patient: Emily [name]  Procedure(s) Performed: Procedure(s): ESOPHAGOGASTRODUODENOSCOPY (EGD) WITH PROPOFOL (N/A)  Patient Location: PACU  Anesthesia Type:MAC  Level of Consciousness: Patient easily awoken, sedated, comfortable, cooperative, following commands, responds to stimulation.   Airway & Oxygen Therapy: Patient spontaneously breathing, ventilating well, oxygen via simple oxygen mask.  Post-op Assessment: Report given to PACU RN, vital signs reviewed and stable, moving all extremities.   Post vital signs: Reviewed and stable.  Complications: No apparent anesthesia complications Last Vitals:  Vitals Value Taken Time  BP 110/50 02/28/19 1321  Temp 36.3 C 02/28/19 1321  Pulse 76 02/28/19 1325  Resp 15 02/28/19 1325  SpO2 100 % 02/28/19 1325  Vitals shown include unvalidated device data.  Last Pain:  Vitals:   02/28/19 1321  TempSrc: Temporal  PainSc: 0-No pain         Complications: No apparent anesthesia complications

## 2019-02-28 NOTE — Anesthesia Procedure Notes (Signed)
Procedure Name: MAC Date/Time: 02/28/2019 1:02 PM Performed by: Deliah Boston, CRNA Pre-anesthesia Checklist: Patient identified, Emergency Drugs available, Suction available and Patient being monitored Patient Re-evaluated:Patient Re-evaluated prior to induction Oxygen Delivery Method: Simple face mask Preoxygenation: Pre-oxygenation with 100% oxygen Induction Type: IV induction Ventilation: Mask ventilation without difficulty Placement Confirmation: positive ETCO2 and breath sounds checked- equal and bilateral

## 2019-03-01 ENCOUNTER — Encounter (HOSPITAL_COMMUNITY): Payer: Self-pay | Admitting: Gastroenterology

## 2019-03-03 ENCOUNTER — Telehealth: Payer: Self-pay | Admitting: Gastroenterology
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
## 2019-03-09 ENCOUNTER — Inpatient Hospital Stay: Payer: Medicare Other | Attending: Hematology

## 2019-03-09 ENCOUNTER — Inpatient Hospital Stay (HOSPITAL_BASED_OUTPATIENT_CLINIC_OR_DEPARTMENT_OTHER): Payer: Medicare Other | Admitting: Hematology

## 2019-03-09 ENCOUNTER — Other Ambulatory Visit: Payer: Self-pay

## 2019-03-09 ENCOUNTER — Encounter: Payer: Self-pay | Admitting: Hematology

## 2019-03-09 VITALS — BP 145/87 | HR 93 | Temp 98.3°F | Resp 17 | Ht 63.0 in | Wt 122.2 lb

## 2019-03-09 DIAGNOSIS — R42 Dizziness and giddiness: Secondary | ICD-10-CM | POA: Diagnosis not present

## 2019-03-09 DIAGNOSIS — C7A8 Other malignant neuroendocrine tumors: Secondary | ICD-10-CM

## 2019-03-09 DIAGNOSIS — D519 Vitamin B12 deficiency anemia, unspecified: Secondary | ICD-10-CM

## 2019-03-09 DIAGNOSIS — Z791 Long term (current) use of non-steroidal anti-inflammatories (NSAID): Secondary | ICD-10-CM | POA: Diagnosis not present

## 2019-03-09 DIAGNOSIS — I10 Essential (primary) hypertension: Secondary | ICD-10-CM | POA: Insufficient documentation

## 2019-03-09 DIAGNOSIS — E119 Type 2 diabetes mellitus without complications: Secondary | ICD-10-CM | POA: Insufficient documentation

## 2019-03-09 DIAGNOSIS — E538 Deficiency of other specified B group vitamins: Secondary | ICD-10-CM | POA: Insufficient documentation

## 2019-03-09 DIAGNOSIS — D649 Anemia, unspecified: Secondary | ICD-10-CM | POA: Diagnosis not present

## 2019-03-09 DIAGNOSIS — F419 Anxiety disorder, unspecified: Secondary | ICD-10-CM | POA: Diagnosis not present

## 2019-03-09 DIAGNOSIS — Z87891 Personal history of nicotine dependence: Secondary | ICD-10-CM | POA: Insufficient documentation

## 2019-03-09 DIAGNOSIS — Z79899 Other long term (current) drug therapy: Secondary | ICD-10-CM | POA: Insufficient documentation

## 2019-03-09 DIAGNOSIS — Z86718 Personal history of other venous thrombosis and embolism: Secondary | ICD-10-CM | POA: Insufficient documentation

## 2019-03-09 DIAGNOSIS — F329 Major depressive disorder, single episode, unspecified: Secondary | ICD-10-CM | POA: Insufficient documentation

## 2019-03-09 DIAGNOSIS — G4730 Sleep apnea, unspecified: Secondary | ICD-10-CM | POA: Insufficient documentation

## 2019-03-09 LAB — CBC WITH DIFFERENTIAL/PLATELET
Abs Immature Granulocytes: 0.03 10*3/uL (ref 0.00–0.07)
Basophils Absolute: 0 10*3/uL (ref 0.0–0.1)
Basophils Relative: 0 %
Eosinophils Absolute: 0 10*3/uL (ref 0.0–0.5)
Eosinophils Relative: 0 %
HCT: 36.8 % (ref 36.0–46.0)
Hemoglobin: 11.8 g/dL — ABNORMAL LOW (ref 12.0–15.0)
Immature Granulocytes: 0 %
Lymphocytes Relative: 20 %
Lymphs Abs: 1.9 10*3/uL (ref 0.7–4.0)
MCH: 24.7 pg — ABNORMAL LOW (ref 26.0–34.0)
MCHC: 32.1 g/dL (ref 30.0–36.0)
MCV: 77 fL — ABNORMAL LOW (ref 80.0–100.0)
Monocytes Absolute: 0.6 10*3/uL (ref 0.1–1.0)
Monocytes Relative: 6 %
Neutro Abs: 7.1 10*3/uL (ref 1.7–7.7)
Neutrophils Relative %: 74 %
Platelets: 242 10*3/uL (ref 150–400)
RBC: 4.78 MIL/uL (ref 3.87–5.11)
RDW: 14.5 % (ref 11.5–15.5)
WBC: 9.6 10*3/uL (ref 4.0–10.5)
nRBC: 0 % (ref 0.0–0.2)

## 2019-03-09 LAB — COMPREHENSIVE METABOLIC PANEL
ALT: 29 U/L (ref 0–44)
AST: 34 U/L (ref 15–41)
Albumin: 3.8 g/dL (ref 3.5–5.0)
Alkaline Phosphatase: 105 U/L (ref 38–126)
Anion gap: 11 (ref 5–15)
BUN: 16 mg/dL (ref 8–23)
CO2: 24 mmol/L (ref 22–32)
Calcium: 8.6 mg/dL — ABNORMAL LOW (ref 8.9–10.3)
Chloride: 107 mmol/L (ref 98–111)
Creatinine, Ser: 0.93 mg/dL (ref 0.44–1.00)
GFR calc Af Amer: >60 mL/min (ref >60)
GFR calc non Af Amer: 59 mL/min — ABNORMAL LOW (ref >60)
Glucose, Bld: 90 mg/dL (ref 70–99)
Potassium: 3.4 mmol/L — ABNORMAL LOW (ref 3.5–5.1)
Sodium: 142 mmol/L (ref 135–145)
Total Bilirubin: 0.3 mg/dL (ref 0.3–1.2)
Total Protein: 6.9 g/dL (ref 6.5–8.1)

## 2019-03-10 ENCOUNTER — Telehealth: Payer: Self-pay | Admitting: Hematology

## 2019-03-10 LAB — CHROMOGRANIN A: Chromogranin A (ng/mL): 67.4 ng/mL (ref 0.0–101.8)

## 2019-03-10 NOTE — Telephone Encounter (Signed)
Added additional appointments for November thru May 2021. Confirmed with patient November/December appointments. Patient aware appointments are scheduled q2w through May 2021 and will get updated schedule at 11/12 visit. Lab/fu 5/5 moved to 5/13 to coordinate with injection. Patient aware.   Per desk nurse injections should be q2w starting 2 weeks from 10/29 (11/12).

## 2019-03-10 NOTE — Telephone Encounter (Signed)
Scheduled appt per 11/5 los.  Sent a staf message to get a calendar mailed out.

## 2019-03-13 ENCOUNTER — Telehealth: Payer: Self-pay | Admitting: *Deleted

## 2019-03-13 NOTE — Telephone Encounter (Signed)
-----   Message from Jonnie Finner, RN sent at 03/13/2019  8:32 AM EST -----  ----- Message ----- From: Truitt Merle, MD Sent: 03/10/2019  11:01 PM EST To: [facility]  Please let pt know her tumor marker was WNL, no concerns, thanks   Truitt Merle

## 2019-03-13 NOTE — Telephone Encounter (Signed)
Notified of message below

## 2019-03-15 LAB — METHYLMALONIC ACID, SERUM: Methylmalonic Acid, Quantitative: 167 nmol/L (ref 0–378)

## 2019-03-16 ENCOUNTER — Inpatient Hospital Stay: Payer: Medicare Other

## 2019-03-16 ENCOUNTER — Other Ambulatory Visit: Payer: Self-pay

## 2019-03-16 VITALS — BP 138/72 | HR 87 | Temp 98.2°F | Resp 17

## 2019-03-16 DIAGNOSIS — C7A8 Other malignant neuroendocrine tumors: Secondary | ICD-10-CM | POA: Diagnosis not present

## 2019-03-16 DIAGNOSIS — D519 Vitamin B12 deficiency anemia, unspecified: Secondary | ICD-10-CM

## 2019-03-16 MED ORDER — CYANOCOBALAMIN 1000 MCG/ML IJ SOLN
1000.0000 ug | Freq: Once | INTRAMUSCULAR | Status: AC
  Administered 2019-03-16: 1000 ug via INTRAMUSCULAR

## 2019-03-16 NOTE — Patient Instructions (Signed)

## 2019-03-16 NOTE — Progress Notes (Signed)
Patient confirmed that she is receiving her B12 injections at [facility] clinic and not administering them at home.   Demetrius Charity, PharmD, [facility] Pharmacist Pharmacy Phone: [phone] 03/16/2019

## 2019-03-22 IMAGING — MR MR HEAD WO/W CM
10 of 13 series · 36 of 48 positions shown · IV contrast (Yes)
Comparison: 07/12/2013 MRI of the head.

CLINICAL DATA: 77 y/o  F; history of appendiceal carcinoid tumor.

EXAM:
MRI HEAD WITHOUT AND WITH CONTRAST
TECHNIQUE: Multiplanar, multiecho pulse sequences of the brain and surrounding
structures were obtained without and with intravenous contrast.
CONTRAST:  13mL MULTIHANCE GADOBENATE DIMEGLUMINE 529 MG/ML IV SOLN

[Series 3: T1 · sagittal · 5.0mm · 0.47mm/px · 1 of 23 slices shown]
[im 1/23]
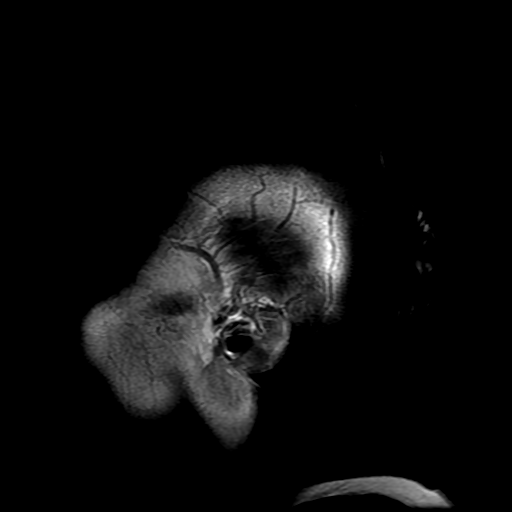

[Series 4: DWI · axial · 3.0mm · 1.09mm/px · z∈[-25,+107]mm · 9 of 92 slices shown (1 of 4)]
[im 1/92]
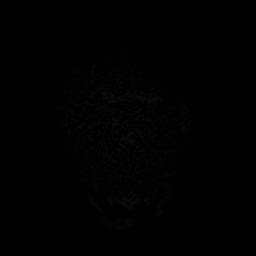
[im 12/92]
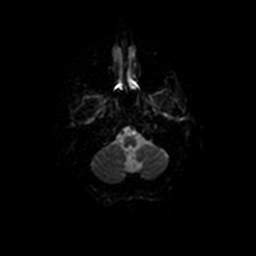
[im 23/92]
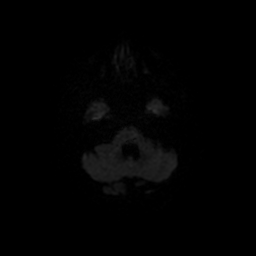
[im 35/92]
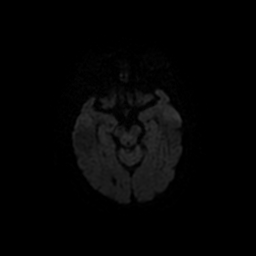
[im 46/92]
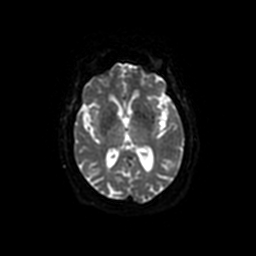
[im 57/92]
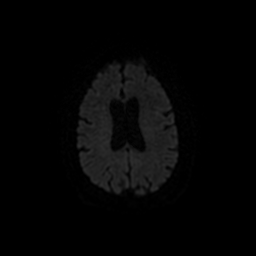
[im 69/92]
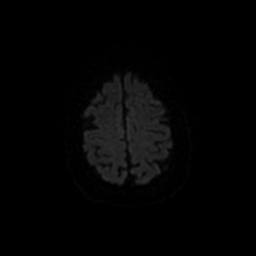
[im 80/92]
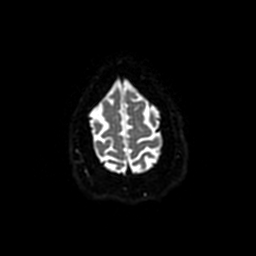
[im 92/92]
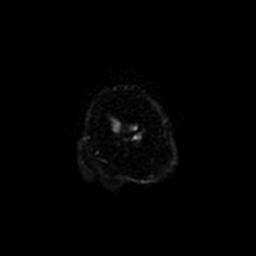

[Series 5: T2 · axial · 5.0mm · 0.43mm/px · z∈[-28,+102]mm · 2 of 20 slices shown]
[im 1/20]
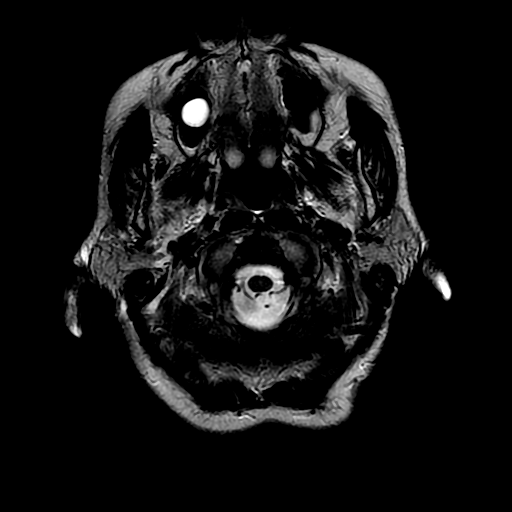
[im 20/20]
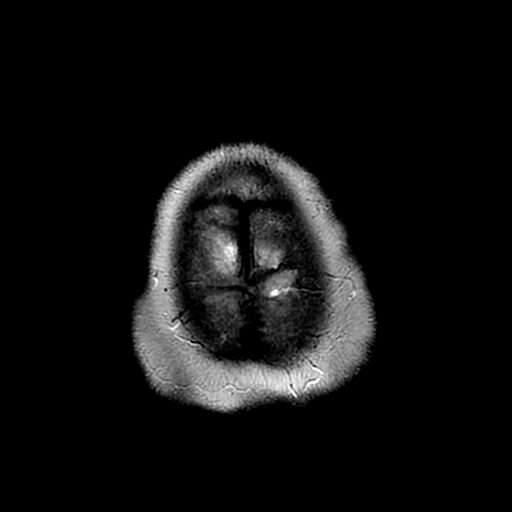

[Series 6: DWI · coronal · 4.0mm · 1.09mm/px · 8 of 84 slices shown (2 of 4)]
[im 1/84]
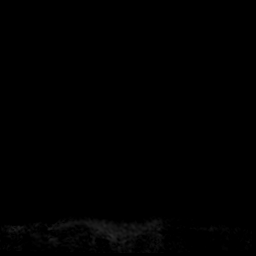
[im 12/84]
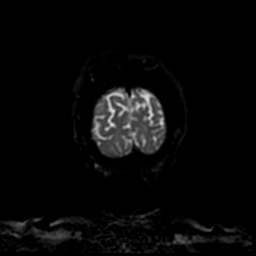
[im 24/84]
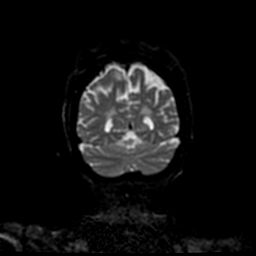
[im 36/84]
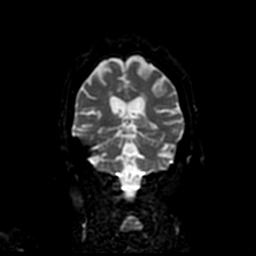
[im 48/84]
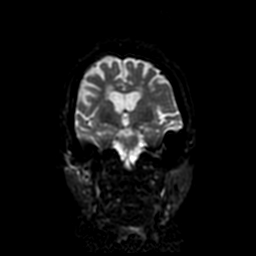
[im 60/84]
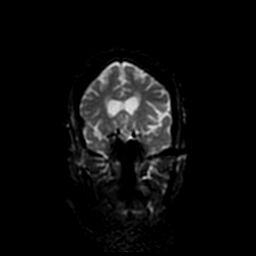
[im 72/84]
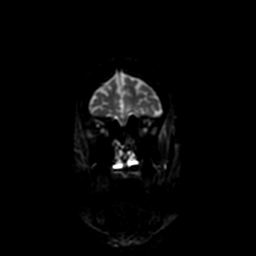
[im 84/84]
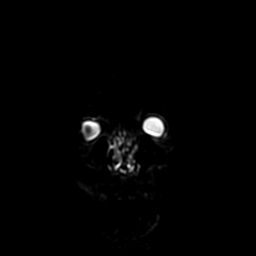

[Series 7: FLAIR · axial · 5.0mm · 0.43mm/px · z∈[-28,+102]mm · 2 of 20 slices shown]
[im 1/20]
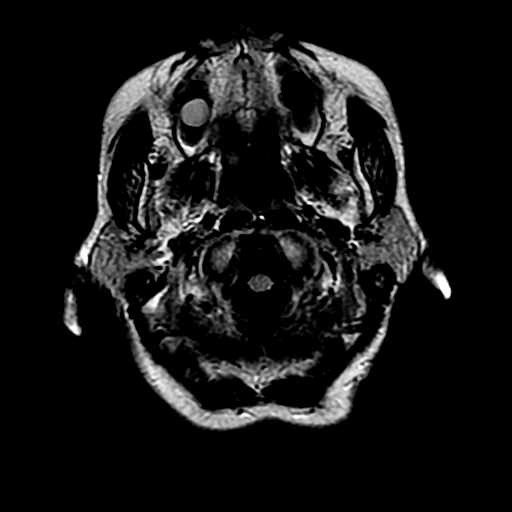
[im 20/20]
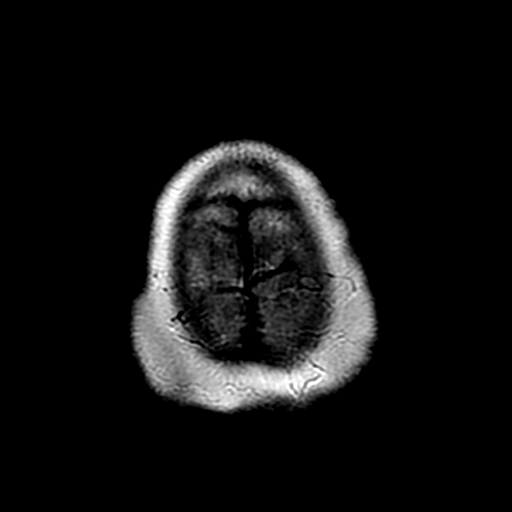

[Series 10: T1 post-contrast · sagittal · 5.0mm · 0.47mm/px · 2 of 23 slices shown (1 of 2)]
[im 1/23]
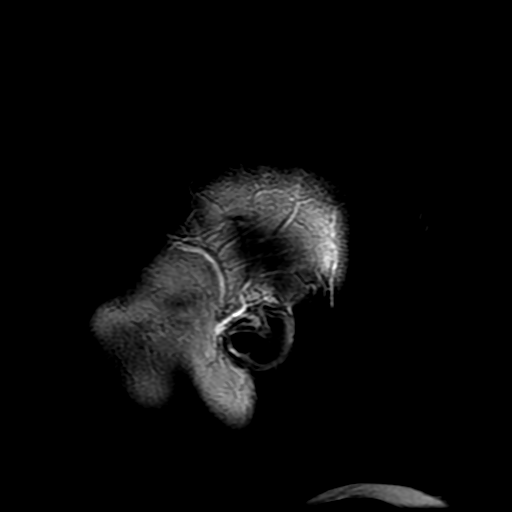
[im 23/23]
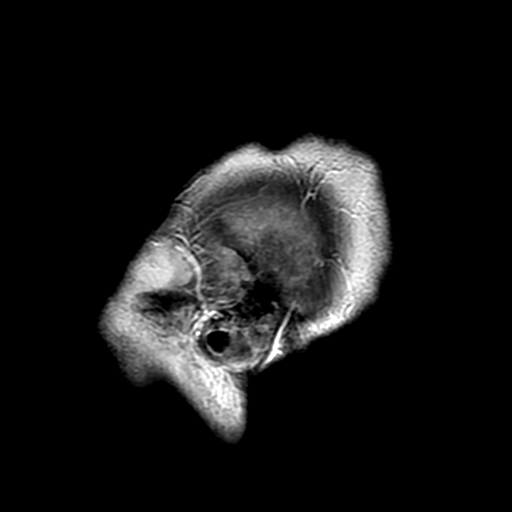

[Series 11: T2 post-contrast · coronal · 5.0mm · 0.45mm/px · 2 of 26 slices shown]
[im 1/26]
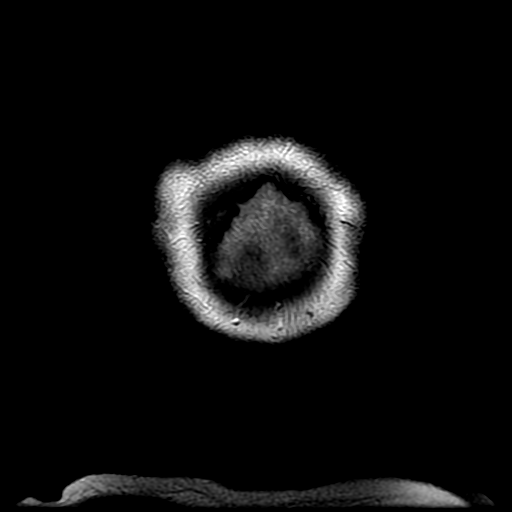
[im 26/26]
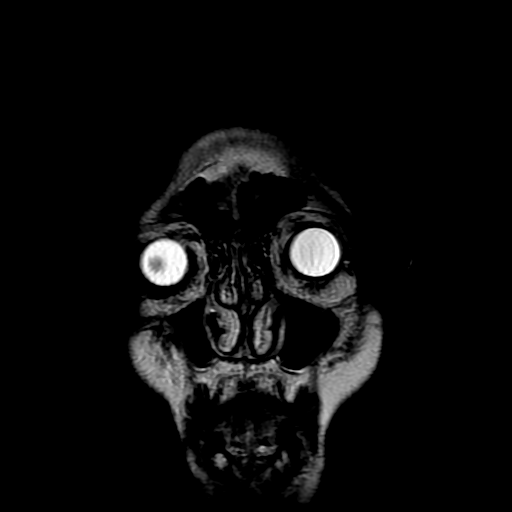

[Series 13: T1 post-contrast · coronal · 5.0mm · 0.45mm/px · 2 of 26 slices shown (2 of 2)]
[im 1/26]
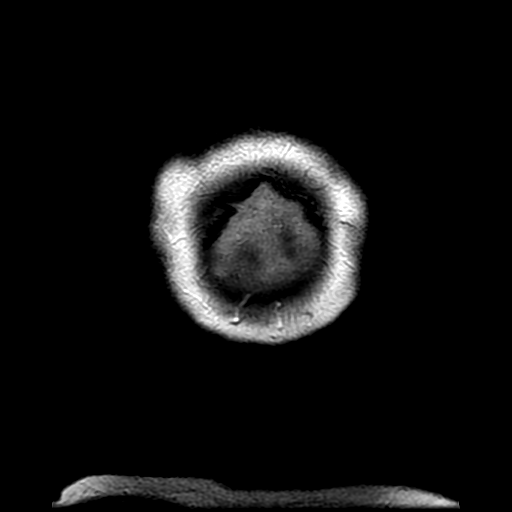
[im 26/26]
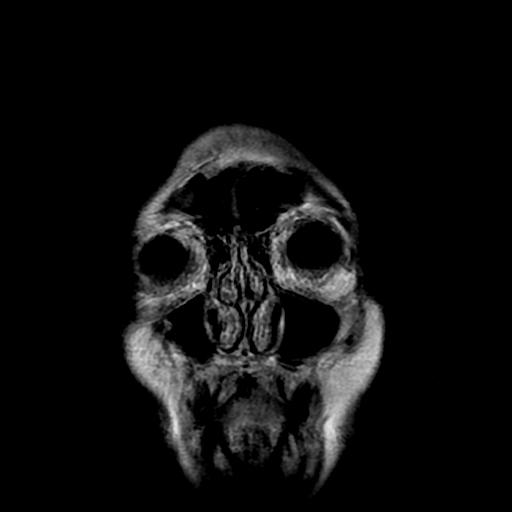

[Series 400: DWI · axial · 3.0mm · 1.09mm/px · z∈[-25,+107]mm · 4 of 45 slices shown (3 of 4)]
[im 1/45]
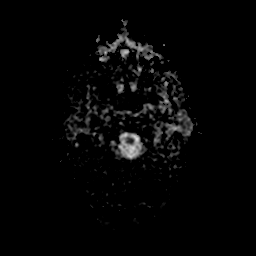
[im 15/45]
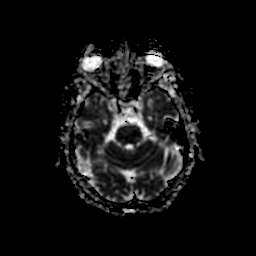
[im 30/45]
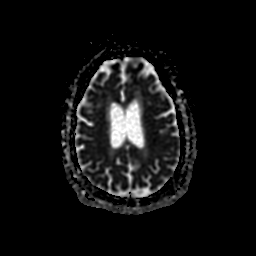
[im 45/45]
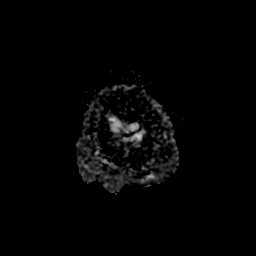

[Series 600: DWI · coronal · 4.0mm · 1.09mm/px · 4 of 42 slices shown (4 of 4)]
[im 1/42]
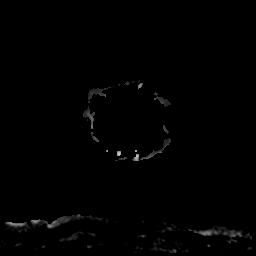
[im 14/42]
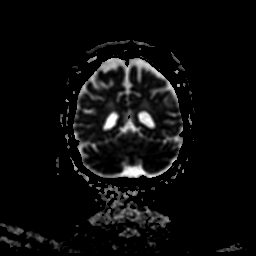
[im 28/42]
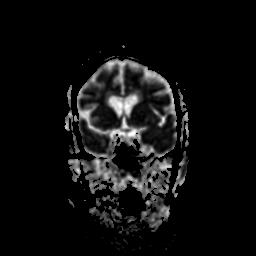
[im 42/42]
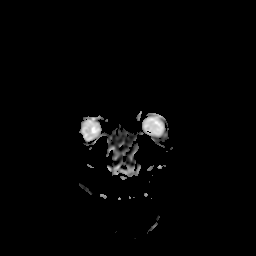

[36 of 48 positions shown; findings below may reference images not displayed]

FINDINGS: Brain: No acute infarction, hemorrhage, hydrocephalus, extra-axial
collection or mass lesion. Several nonspecific foci of T2 FLAIR
hyperintense signal abnormality in subcortical and periventricular
white matter are compatible with mild chronic microvascular ischemic
changes for age. Mild brain parenchymal volume loss. Small chronic
infarct within the right hemi pons, right thalamus, and hemosiderin
stained lacunar infarct in the left putamen. No abnormal
enhancement.

Vascular: Normal flow voids.

Skull and upper cervical spine: Normal marrow signal.

Sinuses/Orbits: Mild maxillary sinus mucosal thickening. Small right
maxillary sinus mucous retention cyst. No abnormal signal of the
paranasal sinuses. Left intra-ocular lens replacement.

Other: None.
IMPRESSION: 1. No acute intracranial process or abnormal enhancement of the
brain.
2. Interval small chronic infarctions within right hemi pons, right
thalamus, and left putamen.
3. Stable mild chronic microvascular ischemic changes and
parenchymal volume loss of the brain.

By: Adriano Sheffield M.D.

## 2019-03-31 ENCOUNTER — Other Ambulatory Visit: Payer: Self-pay

## 2019-03-31 ENCOUNTER — Telehealth: Payer: Self-pay | Admitting: *Deleted

## 2019-03-31 ENCOUNTER — Inpatient Hospital Stay: Payer: Medicare Other

## 2019-03-31 VITALS — BP 148/70 | HR 84 | Temp 98.2°F | Resp 19

## 2019-03-31 DIAGNOSIS — D519 Vitamin B12 deficiency anemia, unspecified: Secondary | ICD-10-CM

## 2019-03-31 DIAGNOSIS — C7A8 Other malignant neuroendocrine tumors: Secondary | ICD-10-CM | POA: Diagnosis not present

## 2019-03-31 IMAGING — CR DG KNEE COMPLETE 4+V*L*
4 series · 4 of 4 positions shown · non-contrast
Comparison: Left knee radiographs January 01, 2016 and left knee MRI
June 10, 2016

CLINICAL DATA: Pain and swelling

EXAM:
LEFT KNEE - COMPLETE 4+ VIEW

[knee ap]
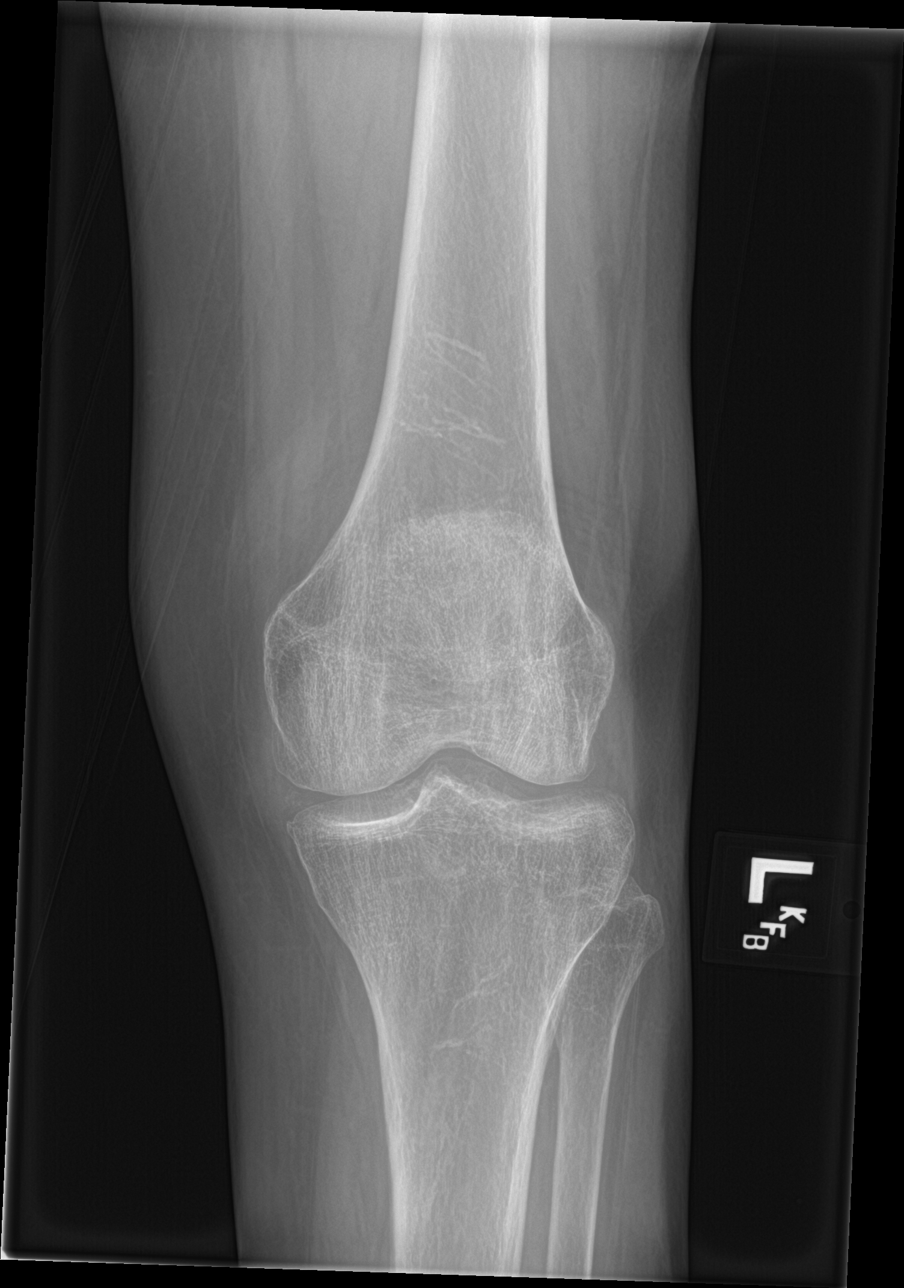

[knee lat]
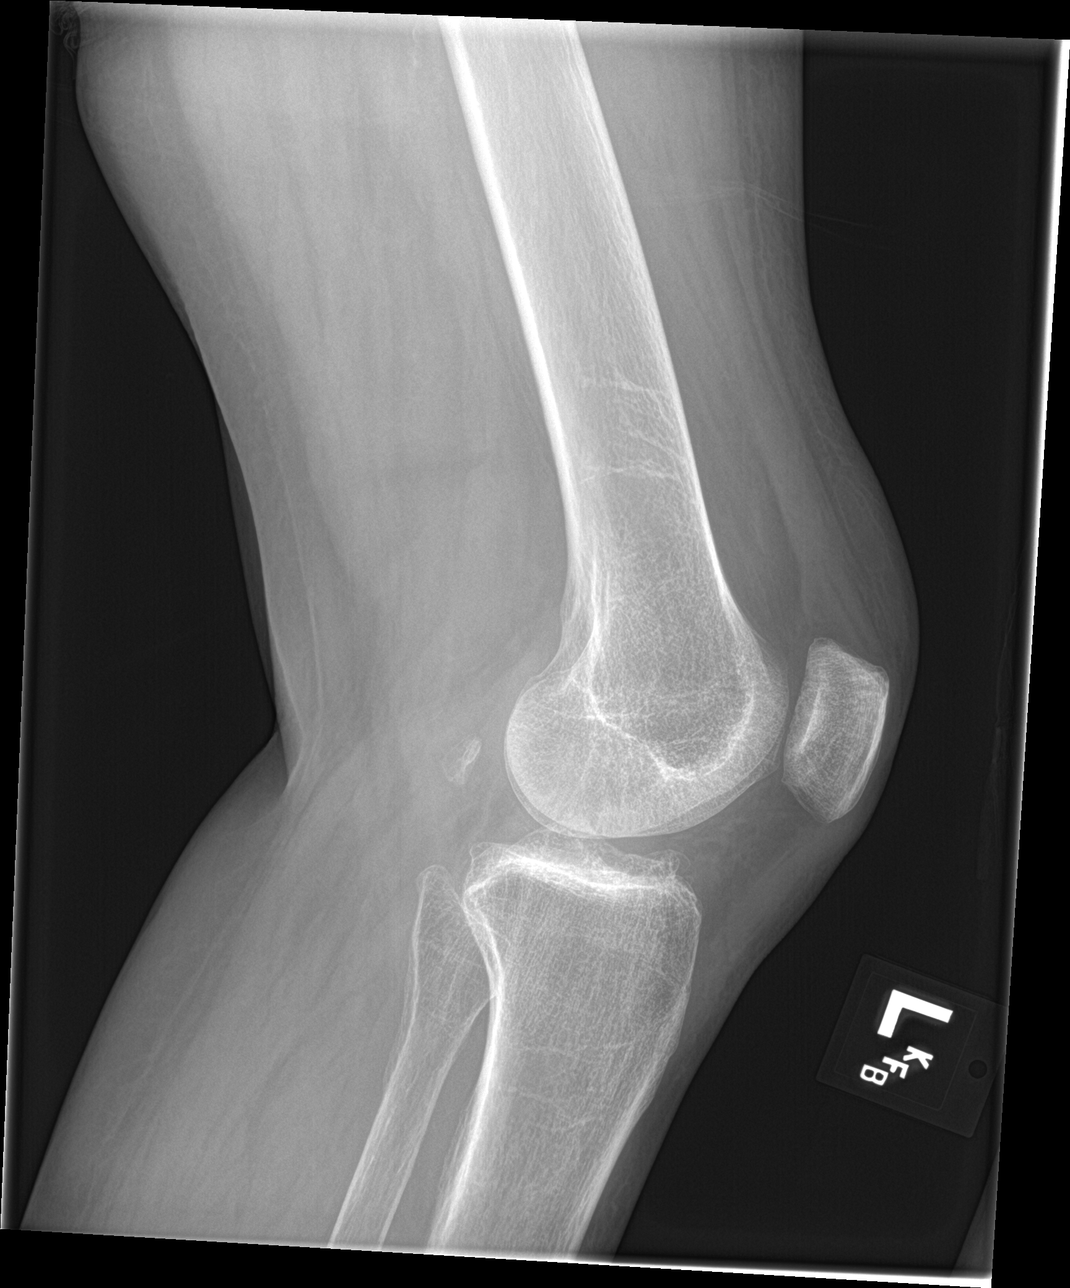

[knee obl (1 of 2)]
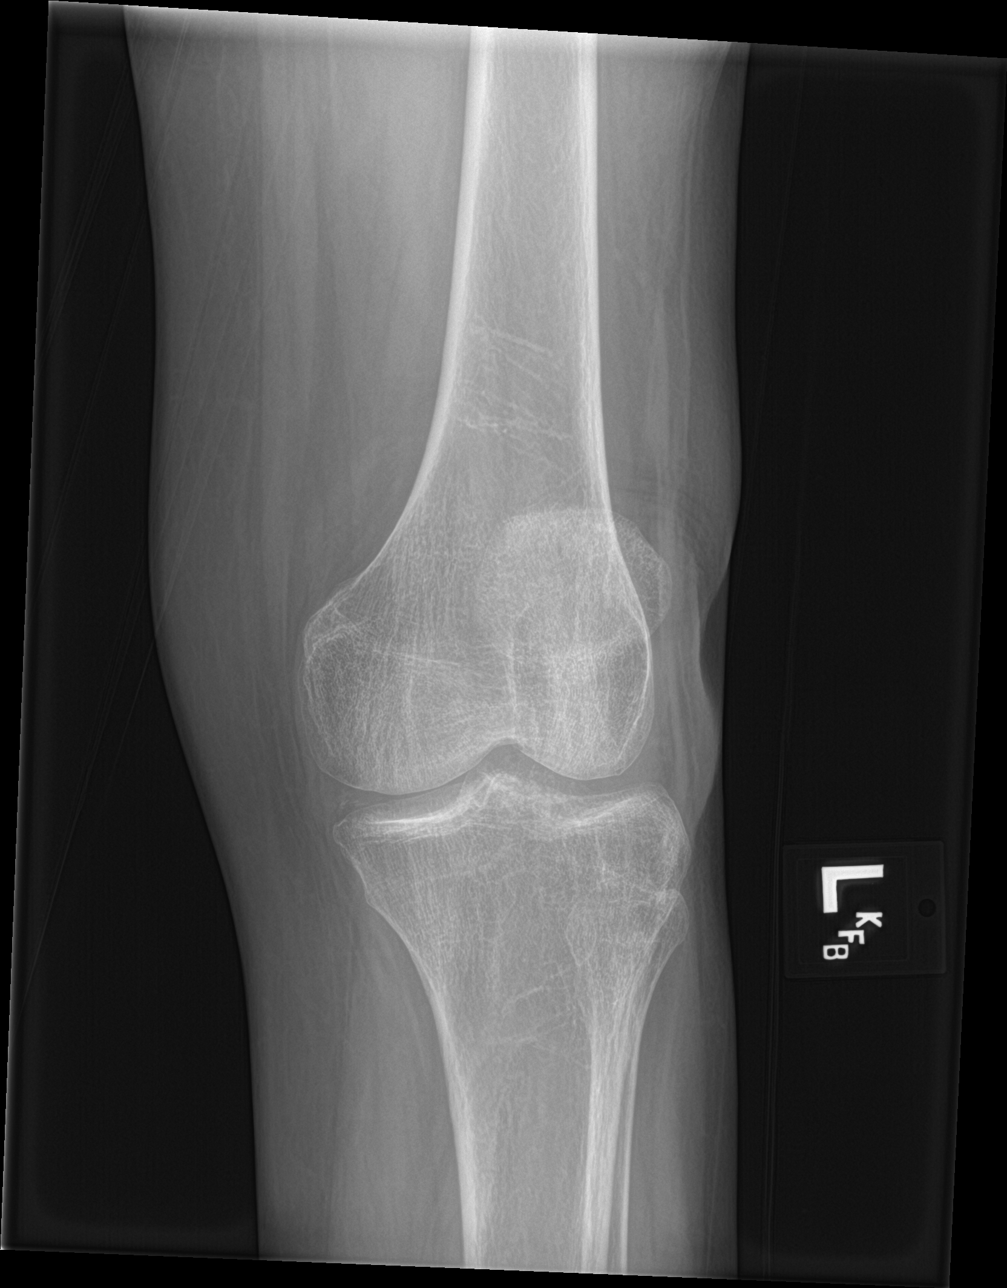

[knee obl (2 of 2)]
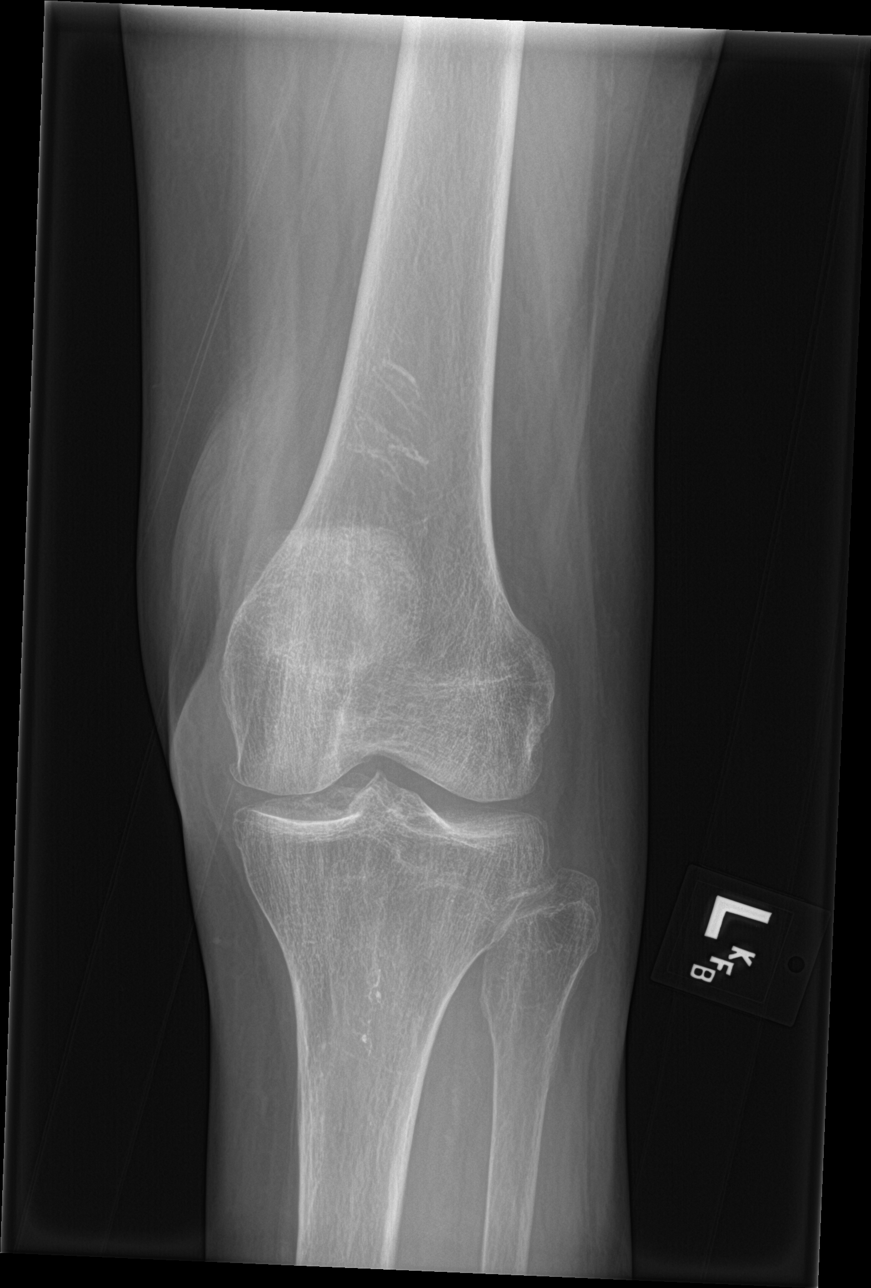

[4 of 4 positions shown; findings below may reference images not displayed]

FINDINGS: Frontal, lateral, and bilateral oblique views were obtained. There
is no fracture or dislocation. No joint effusion.

Bones are osteoporotic. There is chondrocalcinosis. There is no
appreciable joint space narrowing. No erosive change.
IMPRESSION: There is chondrocalcinosis, a finding that may be seen with
osteoarthritis or with calcium pyrophosphate deposition disease.

No appreciable joint space narrowing or erosion. No fracture or
joint effusion.

Bones are osteoporotic.

## 2019-03-31 MED ORDER — CYANOCOBALAMIN 1000 MCG/ML IJ SOLN
1000.0000 ug | Freq: Once | INTRAMUSCULAR | Status: AC
  Administered 2019-03-31: 13:00:00 1000 ug via INTRAMUSCULAR

## 2019-03-31 MED ORDER — CYANOCOBALAMIN 1000 MCG/ML IJ SOLN
INTRAMUSCULAR | Status: AC
  Filled 2019-03-31: qty 1

## 2019-03-31 NOTE — Telephone Encounter (Signed)
Pt called with concerns about arm pain after receiving B12 injection today. Pt stated there was no swelling at the site or allergic reaction. Only pain in arm. Advised patient to continue moving arm through out the day. If arm begins to swell to apply ice and if there is an allergic reaction to go to local ER. Pt verbalized understanding

## 2019-04-04 NOTE — Telephone Encounter (Signed)
error 

## 2019-04-08 ENCOUNTER — Other Ambulatory Visit: Payer: Self-pay | Admitting: Gastroenterology

## 2019-04-10 NOTE — Telephone Encounter (Signed)
Patient is requesting refills of Remeron is this ok to refill?

## 2019-04-11 ENCOUNTER — Ambulatory Visit (INDEPENDENT_AMBULATORY_CARE_PROVIDER_SITE_OTHER): Payer: Medicare Other | Admitting: Podiatry

## 2019-04-11 ENCOUNTER — Other Ambulatory Visit: Payer: Self-pay

## 2019-04-11 ENCOUNTER — Encounter: Payer: Self-pay | Admitting: Podiatry

## 2019-04-11 DIAGNOSIS — L309 Dermatitis, unspecified: Secondary | ICD-10-CM | POA: Diagnosis not present

## 2019-04-11 DIAGNOSIS — E119 Type 2 diabetes mellitus without complications: Secondary | ICD-10-CM | POA: Diagnosis not present

## 2019-04-11 DIAGNOSIS — B351 Tinea unguium: Secondary | ICD-10-CM

## 2019-04-11 DIAGNOSIS — M79675 Pain in left toe(s): Secondary | ICD-10-CM | POA: Diagnosis not present

## 2019-04-11 DIAGNOSIS — I10 Essential (primary) hypertension: Secondary | ICD-10-CM

## 2019-04-11 DIAGNOSIS — M79674 Pain in right toe(s): Secondary | ICD-10-CM

## 2019-04-11 MED ORDER — AMMONIUM LACTATE 12 % EX CREA: TOPICAL_CREAM | CUTANEOUS | 0 refills | Status: DC | PRN

## 2019-04-11 NOTE — Progress Notes (Signed)
Complaint:  Visit Type: Patient returns to my office for continued preventative foot care services. Complaint: Patient states" my nails have grown long and thick and become painful to walk and wear shoes" Patient has been diagnosed with DM with neuropathy.. The patient presents for preventative foot care services. No changes to ROS  Podiatric Exam: Vascular: dorsalis pedis and posterior tibial pulses are palpable bilateral. Capillary return is immediate. Temperature gradient is WNL. Skin turgor WNL  Sensorium: Diminished  Semmes Weinstein monofilament test. Normal tactile sensation bilaterally. Nail Exam: Pt has thick disfigured discolored nails with subungual debris noted bilateral entire nail hallux through fifth toenails Ulcer Exam: There is no evidence of ulcer or pre-ulcerative changes or infection. Orthopedic Exam: Muscle tone and strength are WNL. No limitations in general ROM. No crepitus or effusions noted. Foot type and digits show no abnormalities. Bony prominences are unremarkable. Skin: No Porokeratosis. No infection or ulcers.  Peeling noted plantar aspect feet  B/L.  Diagnosis:  Onychomycosis, , Pain in right toe, pain in left toes  Dermatitis feet  B/L.  Treatment & Plan Procedures and Treatment: Consent by patient was obtained for treatment procedures.   Debridement of mycotic and hypertrophic toenails, 1 through 5 bilateral and clearing of subungual debris. No ulceration, no infection noted. Prescribe lac-hydrin. Return Visit-Office Procedure: Patient instructed to return to the office for a follow up visit 10 weeks  for continued evaluation and treatment.    Gardiner Barefoot DPM

## 2019-04-13 ENCOUNTER — Inpatient Hospital Stay: Payer: Medicare Other | Attending: Hematology

## 2019-04-13 ENCOUNTER — Other Ambulatory Visit: Payer: Self-pay

## 2019-04-13 VITALS — BP 135/66 | HR 78 | Temp 98.6°F | Resp 17

## 2019-04-13 DIAGNOSIS — Z79899 Other long term (current) drug therapy: Secondary | ICD-10-CM | POA: Insufficient documentation

## 2019-04-13 DIAGNOSIS — D519 Vitamin B12 deficiency anemia, unspecified: Secondary | ICD-10-CM

## 2019-04-13 DIAGNOSIS — C7A8 Other malignant neuroendocrine tumors: Secondary | ICD-10-CM | POA: Diagnosis not present

## 2019-04-13 DIAGNOSIS — E538 Deficiency of other specified B group vitamins: Secondary | ICD-10-CM | POA: Diagnosis not present

## 2019-04-13 DIAGNOSIS — D649 Anemia, unspecified: Secondary | ICD-10-CM | POA: Diagnosis not present

## 2019-04-13 MED ORDER — CYANOCOBALAMIN 1000 MCG/ML IJ SOLN
1000.0000 ug | Freq: Once | INTRAMUSCULAR | Status: AC
  Administered 2019-04-13: 1000 ug via INTRAMUSCULAR

## 2019-04-13 NOTE — Patient Instructions (Signed)

## 2019-04-13 NOTE — Progress Notes (Signed)
Pt stated that she has been having trouble with her balance lately and fall fallen one time. She stated that she wasn't in any pain today but wanted Dr Lewayne Bunting nurse to give her a call. I Let Malachy Mood RN know of patients request.

## 2019-04-17 ENCOUNTER — Encounter: Payer: Self-pay | Admitting: Gastroenterology

## 2019-04-17 ENCOUNTER — Ambulatory Visit (INDEPENDENT_AMBULATORY_CARE_PROVIDER_SITE_OTHER): Payer: Medicare Other | Admitting: Gastroenterology

## 2019-04-17 VITALS — BP 100/60 | HR 76 | Temp 97.9°F | Ht 63.0 in | Wt 121.0 lb

## 2019-04-17 DIAGNOSIS — K2100 Gastro-esophageal reflux disease with esophagitis, without bleeding: Secondary | ICD-10-CM

## 2019-04-17 DIAGNOSIS — R1310 Dysphagia, unspecified: Secondary | ICD-10-CM

## 2019-04-17 DIAGNOSIS — R1319 Other dysphagia: Secondary | ICD-10-CM

## 2019-04-17 DIAGNOSIS — K224 Dyskinesia of esophagus: Secondary | ICD-10-CM | POA: Diagnosis not present

## 2019-04-17 DIAGNOSIS — R1084 Generalized abdominal pain: Secondary | ICD-10-CM

## 2019-04-17 DIAGNOSIS — R1030 Lower abdominal pain, unspecified: Secondary | ICD-10-CM

## 2019-04-17 DIAGNOSIS — K2270 Barrett's esophagus without dysplasia: Secondary | ICD-10-CM

## 2019-04-17 DIAGNOSIS — R634 Abnormal weight loss: Secondary | ICD-10-CM

## 2019-04-17 MED ORDER — GI COCKTAIL ~~LOC~~: ORAL | 2 refills | Status: DC

## 2019-04-17 MED ORDER — PROMETHAZINE HCL 12.5 MG PO TABS: ORAL_TABLET | ORAL | 2 refills | Status: DC

## 2019-04-17 NOTE — Patient Instructions (Signed)
Eat small frequent meals  We will send GI Cocktail to your pharmacy  We will refill your phenergan today  Use Ensure twice daily  Follow up in 3 months  I appreciate the  opportunity to care for you  Thank You   Harl Bowie , MD
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
## 2019-04-18 ENCOUNTER — Encounter: Payer: Self-pay | Admitting: Gastroenterology

## 2019-04-25 ENCOUNTER — Other Ambulatory Visit: Payer: Self-pay | Admitting: Family

## 2019-04-25 ENCOUNTER — Other Ambulatory Visit (HOSPITAL_COMMUNITY): Payer: Self-pay | Admitting: Family

## 2019-04-25 DIAGNOSIS — R2681 Unsteadiness on feet: Secondary | ICD-10-CM

## 2019-04-27 ENCOUNTER — Inpatient Hospital Stay: Payer: Medicare Other

## 2019-04-27 ENCOUNTER — Other Ambulatory Visit: Payer: Self-pay

## 2019-04-27 VITALS — BP 130/68 | Temp 98.0°F | Resp 18

## 2019-04-27 DIAGNOSIS — C7A8 Other malignant neuroendocrine tumors: Secondary | ICD-10-CM | POA: Diagnosis not present

## 2019-04-27 DIAGNOSIS — D519 Vitamin B12 deficiency anemia, unspecified: Secondary | ICD-10-CM

## 2019-04-27 MED ORDER — CYANOCOBALAMIN 1000 MCG/ML IJ SOLN
1000.0000 ug | Freq: Once | INTRAMUSCULAR | Status: AC
  Administered 2019-04-27: 1000 ug via INTRAMUSCULAR

## 2019-04-27 MED ORDER — CYANOCOBALAMIN 1000 MCG/ML IJ SOLN
INTRAMUSCULAR | Status: AC
  Filled 2019-04-27: qty 1

## 2019-04-27 NOTE — Patient Instructions (Signed)
Cyanocobalamin, Pyridoxine, and Folate What is this medicine? A multivitamin containing folic acid, vitamin B6, and vitamin B12. This medicine may be used for other purposes; ask your health care provider or pharmacist if you have questions. COMMON BRAND NAME(S): AllanFol RX, AllanTex, Av-Vite FB, B Complex with Folic Acid, ComBgen, FaBB, Folamin, Folastin, Folbalin, Folbee, Folbic, Folcaps, Folgard, Folgard RX, Folgard RX 2.2, Folplex, Folplex 2.2, Foltabs 800, Foltx, Homocysteine Formula, Niva-Fol, NuFol, TL Gard RX, Virt-Gard, Virt-Vite, Virt-Vite Forte, Vita-Respa What should I tell my health care provider before I take this medicine? They need to know if you have any of these conditions:  bleeding or clotting disorder  history of anemia of any type  other chronic health condition  an unusual or allergic reaction to vitamins, other medicines, foods, dyes, or preservatives  pregnant or trying to get pregnant  breast-feeding How should I use this medicine? Take by mouth with a glass of water. May take with food. Follow the directions on the prescription label. It is usually given once a day. Do not take your medicine more often than directed. Contact your pediatrician regarding the use of this medicine in children. Special care may be needed. Overdosage: If you think you have taken too much of this medicine contact a poison control center or emergency room at once. NOTE: This medicine is only for you. Do not share this medicine with others. What if I miss a dose? If you miss a dose, take it as soon as you can. If it is almost time for your next dose, take only that dose. Do not take double or extra doses. What may interact with this medicine?  levodopa This list may not describe all possible interactions. Give your health care provider a list of all the medicines, herbs, non-prescription drugs, or dietary supplements you use. Also tell them if you smoke, drink alcohol, or use illegal  drugs. Some items may interact with your medicine. What should I watch for while using this medicine? See your health care professional for regular checks on your progress. Remember that vitamin supplements do not replace the need for good nutrition from a balanced diet. What side effects may I notice from receiving this medicine? Side effects that you should report to your doctor or health care professional as soon as possible:  allergic reaction such as skin rash or difficulty breathing  vomiting Side effects that usually do not require medical attention (report to your doctor or health care professional if they continue or are bothersome):  nausea  stomach upset This list may not describe all possible side effects. Call your doctor for medical advice about side effects. You may report side effects to FDA at 1-800-FDA-1088. Where should I keep my medicine? Keep out of the reach of children. Most vitamins should be stored at controlled room temperature. Check your specific product directions. Protect from heat and moisture. Throw away any unused medicine after the expiration date. NOTE: This sheet is a summary. It may not cover all possible information. If you have questions about this medicine, talk to your doctor, pharmacist, or health care provider.  2020 Elsevier/Gold Standard (2007-06-11 00:59:55)  

## 2019-05-07 ENCOUNTER — Telehealth: Payer: Self-pay | Admitting: Gastroenterology

## 2019-05-07 NOTE — Telephone Encounter (Signed)
Patient paged on-call to discuss her ongoing nausea. Recent notes by Dr. Silverio Decamp reviewed. Long hx of nausea and motility d/o. She finally trialed Boost today, and had nausea afterwards, but no emesis. Sxs essentially same as prior. Wants to know if ok to take phenergan 25 mg instead of 12.5 mg. Wants to trial soups- reasonable plan and can set Boost aside since she didn't do well with it earlier and can perhaps retrial another day. Stick with low fat, low fiber foods, several small meals through the day as tolerated. Discussed Reglan- has trialed this in the past and wants to discuss w/ Dr. Silverio Decamp if appropriate to try again. No improvement with Zofran before. Unsure if she has used Compazine.   Uses GI cocktail as prescribed for abdominal pain. This works for her, but she'd like to discuss her chronic abdominal pain.  Also wants to discuss her chronic constipation- takes OTC stool softener and Linzess, with alternating bowel pattern. This too is not an acute issue for her. Had BM today with Rx.   Given that the majority of her issues seems chronic, but stable, no recommendation for ER/Urgent care. No new medications or studies ordered at this time and recommended calling to schedule f/u appt to discuss chronic GI issues and ongoing tx plan. All questions answered and appreciative of phone call.

## 2019-05-08 NOTE — Telephone Encounter (Signed)
Duplicate telephone note created in error. See other dated the same for full details.

## 2019-05-08 NOTE — Telephone Encounter (Signed)
Spoke with the patient. She admits to feeling like her bowels are not moving as well as she needs. States "when I pass gas, it just feels better for a minute. I though I may be a little constipated." endorses that she is taking her stool softener and the Linzess which usually work well for her. She does have Miralax and has purged in the past with success. She is scheduled for imaging tomorrow. Does not want to do a full purge due to concerns that she may not have control of her bowels tomorrow. She will instead drink 1 to 2 glasses today.  The Promethazine increased to 25 mg has helped her nausea. She asks if she can do that for longer. Please advise.

## 2019-05-09 ENCOUNTER — Other Ambulatory Visit: Payer: Self-pay

## 2019-05-09 ENCOUNTER — Ambulatory Visit (HOSPITAL_COMMUNITY)
Admission: RE | Admit: 2019-05-09 | Discharge: 2019-05-09 | Disposition: A | Discharge status: Home / Self Care | Payer: Medicare Other | Source: Ambulatory Visit | Attending: Family | Admitting: Family

## 2019-05-09 DIAGNOSIS — R2681 Unsteadiness on feet: Secondary | ICD-10-CM | POA: Diagnosis not present

## 2019-05-09 MED ORDER — PROMETHAZINE HCL 25 MG PO TABS: 25.0000 mg | ORAL_TABLET | Freq: Every day | ORAL | 0 refills | Status: DC | PRN

## 2019-05-09 NOTE — Telephone Encounter (Signed)
Ok to send her Rx for Promethazine 25mg  daily as needed. Continue Linzess and Miralax 1-2 capfuls daily as needed. Thanks

## 2019-05-09 NOTE — Telephone Encounter (Signed)
Left message for the patient. Rx for phenergan 25 mg once daily sent to the [facility] on [address].

## 2019-05-10 NOTE — Telephone Encounter (Signed)
Pt stated that she is returning your call from Monday.

## 2019-05-10 NOTE — Telephone Encounter (Signed)
Spoke with the patient. She is aware of the medication change. Thanks me for getting back to her about this. Patient reports she is again having to force herself to eat. She has her bowels moving. No longer feels constipated. She has nausea under control with the Phenergan. Complains of no appetite. Food does not taste good anymore. Mentions placement of g tube. Appointment schedule. Discuss tips to maintain hydration. Advised small frequest "snacks" instead of a "meal." She states she is "weak." She needed assistance to change into the patient gown for her recent imaging.  Any labs needed before the visit?

## 2019-05-11 ENCOUNTER — Other Ambulatory Visit: Payer: Self-pay | Admitting: *Deleted

## 2019-05-11 ENCOUNTER — Other Ambulatory Visit: Payer: Self-pay

## 2019-05-11 ENCOUNTER — Inpatient Hospital Stay: Payer: Medicare Other | Attending: Hematology

## 2019-05-11 VITALS — BP 107/50 | HR 63 | Temp 97.5°F | Resp 18

## 2019-05-11 DIAGNOSIS — D649 Anemia, unspecified: Secondary | ICD-10-CM | POA: Insufficient documentation

## 2019-05-11 DIAGNOSIS — C7A8 Other malignant neuroendocrine tumors: Secondary | ICD-10-CM | POA: Insufficient documentation

## 2019-05-11 DIAGNOSIS — D519 Vitamin B12 deficiency anemia, unspecified: Secondary | ICD-10-CM

## 2019-05-11 DIAGNOSIS — E538 Deficiency of other specified B group vitamins: Secondary | ICD-10-CM | POA: Diagnosis not present

## 2019-05-11 DIAGNOSIS — Z79899 Other long term (current) drug therapy: Secondary | ICD-10-CM | POA: Diagnosis not present

## 2019-05-11 DIAGNOSIS — R1084 Generalized abdominal pain: Secondary | ICD-10-CM

## 2019-05-11 MED ORDER — CYANOCOBALAMIN 1000 MCG/ML IJ SOLN
1000.0000 ug | Freq: Once | INTRAMUSCULAR | Status: AC
  Administered 2019-05-11: 14:00:00 1000 ug via INTRAMUSCULAR
  Filled 2019-05-11: qty 1

## 2019-05-11 NOTE — Patient Instructions (Signed)
Cyanocobalamin, Pyridoxine, and Folate What is this medicine? A multivitamin containing folic acid, vitamin B6, and vitamin B12. This medicine may be used for other purposes; ask your health care provider or pharmacist if you have questions. COMMON BRAND NAME(S): AllanFol RX, AllanTex, Av-Vite FB, B Complex with Folic Acid, ComBgen, FaBB, Folamin, Folastin, Folbalin, Folbee, Folbic, Folcaps, Folgard, Folgard RX, Folgard RX 2.2, Folplex, Folplex 2.2, Foltabs 800, Foltx, Homocysteine Formula, Niva-Fol, NuFol, TL Gard RX, Virt-Gard, Virt-Vite, Virt-Vite Forte, Vita-Respa What should I tell my health care provider before I take this medicine? They need to know if you have any of these conditions:  bleeding or clotting disorder  history of anemia of any type  other chronic health condition  an unusual or allergic reaction to vitamins, other medicines, foods, dyes, or preservatives  pregnant or trying to get pregnant  breast-feeding How should I use this medicine? Take by mouth with a glass of water. May take with food. Follow the directions on the prescription label. It is usually given once a day. Do not take your medicine more often than directed. Contact your pediatrician regarding the use of this medicine in children. Special care may be needed. Overdosage: If you think you have taken too much of this medicine contact a poison control center or emergency room at once. NOTE: This medicine is only for you. Do not share this medicine with others. What if I miss a dose? If you miss a dose, take it as soon as you can. If it is almost time for your next dose, take only that dose. Do not take double or extra doses. What may interact with this medicine?  levodopa This list may not describe all possible interactions. Give your health care provider a list of all the medicines, herbs, non-prescription drugs, or dietary supplements you use. Also tell them if you smoke, drink alcohol, or use illegal  drugs. Some items may interact with your medicine. What should I watch for while using this medicine? See your health care professional for regular checks on your progress. Remember that vitamin supplements do not replace the need for good nutrition from a balanced diet. What side effects may I notice from receiving this medicine? Side effects that you should report to your doctor or health care professional as soon as possible:  allergic reaction such as skin rash or difficulty breathing  vomiting Side effects that usually do not require medical attention (report to your doctor or health care professional if they continue or are bothersome):  nausea  stomach upset This list may not describe all possible side effects. Call your doctor for medical advice about side effects. You may report side effects to FDA at 1-800-FDA-1088. Where should I keep my medicine? Keep out of the reach of children. Most vitamins should be stored at controlled room temperature. Check your specific product directions. Protect from heat and moisture. Throw away any unused medicine after the expiration date. NOTE: This sheet is a summary. It may not cover all possible information. If you have questions about this medicine, talk to your doctor, pharmacist, or health care provider.  2020 Elsevier/Gold Standard (2007-06-11 00:59:55)  

## 2019-05-11 NOTE — Telephone Encounter (Signed)
Please check BMP and CBC.  She has been having decline in appetite on and off over the past few months.  I will discuss with patient the options for managing her nutritional status during the visit.  Thank you

## 2019-05-11 NOTE — Telephone Encounter (Signed)
CBC and BMP orders placed in Epic. Patient notified that she needed to have the labs prior to her appt on 1/25. Verbalized understanding and stated she would come prior to her appt to have the labs drawn.

## 2019-05-19 ENCOUNTER — Telehealth: Payer: Self-pay | Admitting: Gastroenterology

## 2019-05-19 NOTE — Telephone Encounter (Signed)
Patient has had recent falls. MRI of her brain was done. She has an understanding that she has had "many strokes." She has been told by her daughter "this thing going on in my head is related to my nausea and not wanting to eat." She wants to know what can be done about this. She thinks she may not be able to come for her "tests that Dr Silverio Decamp wants me to do." Not clear if she is concerned because of her recent falls and falling again. Discussed that she is expected to have her labs test prior to her appointment on 05/28/18, however if coming out of the house twice requires taxing effort, the labs could be drawn on the day of the appointment. This would be less desirable because the provider would not be able to discuss the results and how it relates to her symptoms. Also advised we have wheelchairs available if she is concerned about safety.

## 2019-05-22 ENCOUNTER — Other Ambulatory Visit (INDEPENDENT_AMBULATORY_CARE_PROVIDER_SITE_OTHER): Payer: Medicare Other

## 2019-05-22 DIAGNOSIS — R1084 Generalized abdominal pain: Secondary | ICD-10-CM

## 2019-05-22 LAB — BASIC METABOLIC PANEL
BUN: 16 mg/dL (ref 6–23)
CO2: 21 mEq/L (ref 19–32)
Calcium: 8.8 mg/dL (ref 8.4–10.5)
Chloride: 111 mEq/L (ref 96–112)
Creatinine, Ser: 0.82 mg/dL (ref 0.40–1.20)
GFR: 81.39 mL/min (ref >60.00)
Glucose, Bld: 94 mg/dL (ref 70–99)
Potassium: 4.3 mEq/L (ref 3.5–5.1)
Sodium: 142 mEq/L (ref 135–145)

## 2019-05-22 LAB — CBC WITH DIFFERENTIAL/PLATELET
Basophils Absolute: 0 10*3/uL (ref 0.0–0.1)
Basophils Relative: 0.5 % (ref 0.0–3.0)
Eosinophils Absolute: 0.1 10*3/uL (ref 0.0–0.7)
Eosinophils Relative: 0.7 % (ref 0.0–5.0)
HCT: 38.7 % (ref 36.0–46.0)
Hemoglobin: 12.2 g/dL (ref 12.0–15.0)
Lymphocytes Relative: 32.3 % (ref 12.0–46.0)
Lymphs Abs: 2.5 10*3/uL (ref 0.7–4.0)
MCHC: 31.4 g/dL (ref 30.0–36.0)
MCV: 78.6 fl (ref 78.0–100.0)
Monocytes Absolute: 0.4 10*3/uL (ref 0.1–1.0)
Monocytes Relative: 5.8 % (ref 3.0–12.0)
Neutro Abs: 4.7 10*3/uL (ref 1.4–7.7)
Neutrophils Relative %: 60.7 % (ref 43.0–77.0)
Platelets: 241 10*3/uL (ref 150.0–400.0)
RBC: 4.92 Mil/uL (ref 3.87–5.11)
RDW: 15.2 % (ref 11.5–15.5)
WBC: 7.8 10*3/uL (ref 4.0–10.5)

## 2019-05-23 ENCOUNTER — Encounter: Payer: Self-pay | Admitting: Gastroenterology

## 2019-05-23 NOTE — Telephone Encounter (Signed)
Ok, thanks.

## 2019-05-24 ENCOUNTER — Encounter: Payer: Self-pay | Admitting: *Deleted

## 2019-05-24 ENCOUNTER — Other Ambulatory Visit: Payer: Self-pay | Admitting: *Deleted

## 2019-05-25 ENCOUNTER — Other Ambulatory Visit: Payer: Self-pay

## 2019-05-25 ENCOUNTER — Ambulatory Visit (INDEPENDENT_AMBULATORY_CARE_PROVIDER_SITE_OTHER): Payer: Medicare Other | Admitting: Neurology

## 2019-05-25 ENCOUNTER — Inpatient Hospital Stay: Payer: Medicare Other

## 2019-05-25 ENCOUNTER — Encounter: Payer: Self-pay | Admitting: Neurology

## 2019-05-25 VITALS — BP 157/75 | HR 107 | Temp 97.2°F | Ht 63.0 in | Wt 119.0 lb

## 2019-05-25 VITALS — BP 118/62 | HR 62 | Temp 98.2°F | Resp 18

## 2019-05-25 DIAGNOSIS — R269 Unspecified abnormalities of gait and mobility: Secondary | ICD-10-CM | POA: Diagnosis not present

## 2019-05-25 DIAGNOSIS — R55 Syncope and collapse: Secondary | ICD-10-CM | POA: Diagnosis not present

## 2019-05-25 DIAGNOSIS — C7A8 Other malignant neuroendocrine tumors: Secondary | ICD-10-CM | POA: Diagnosis not present

## 2019-05-25 DIAGNOSIS — R42 Dizziness and giddiness: Secondary | ICD-10-CM

## 2019-05-25 DIAGNOSIS — D519 Vitamin B12 deficiency anemia, unspecified: Secondary | ICD-10-CM

## 2019-05-25 MED ORDER — CYANOCOBALAMIN 1000 MCG/ML IJ SOLN
1000.0000 ug | Freq: Once | INTRAMUSCULAR | Status: AC
  Administered 2019-05-25: 1000 ug via INTRAMUSCULAR

## 2019-05-25 MED ORDER — CYANOCOBALAMIN 1000 MCG/ML IJ SOLN
INTRAMUSCULAR | Status: AC
  Filled 2019-05-25: qty 1
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
## 2019-05-25 NOTE — Patient Instructions (Signed)

## 2019-05-29 ENCOUNTER — Ambulatory Visit: Payer: Medicare Other | Admitting: Gastroenterology

## 2019-05-30 LAB — TSH: TSH: 0.907 u[IU]/mL (ref 0.450–4.500)

## 2019-05-30 LAB — RPR: RPR Ser Ql: NONREACTIVE

## 2019-05-30 LAB — VITAMIN B12: Vitamin B-12: >2000 pg/mL — ABNORMAL HIGH (ref 232–1245)

## 2019-05-30 LAB — COPPER, SERUM: Copper: 75 ug/dL — ABNORMAL LOW (ref 80–158)

## 2019-05-30 LAB — SEDIMENTATION RATE: Sed Rate: 13 mm/hr (ref 0–40)

## 2019-05-31 ENCOUNTER — Ambulatory Visit (INDEPENDENT_AMBULATORY_CARE_PROVIDER_SITE_OTHER): Payer: Medicare Other | Admitting: Gastroenterology

## 2019-05-31 ENCOUNTER — Other Ambulatory Visit: Payer: Self-pay

## 2019-05-31 VITALS — BP 110/60 | HR 75 | Temp 97.0°F | Ht 63.0 in | Wt 117.0 lb

## 2019-05-31 DIAGNOSIS — R112 Nausea with vomiting, unspecified: Secondary | ICD-10-CM

## 2019-05-31 DIAGNOSIS — K5904 Chronic idiopathic constipation: Secondary | ICD-10-CM

## 2019-05-31 DIAGNOSIS — R630 Anorexia: Secondary | ICD-10-CM

## 2019-05-31 DIAGNOSIS — K219 Gastro-esophageal reflux disease without esophagitis: Secondary | ICD-10-CM

## 2019-05-31 DIAGNOSIS — R42 Dizziness and giddiness: Secondary | ICD-10-CM

## 2019-05-31 MED ORDER — POLYETHYLENE GLYCOL 3350 17 G PO PACK: 17.0000 g | PACK | Freq: Every day | ORAL | 0 refills | Status: DC

## 2019-05-31 MED ORDER — LINACLOTIDE 72 MCG PO CAPS: 72.0000 ug | ORAL_CAPSULE | Freq: Every day | ORAL | 3 refills | Status: DC

## 2019-05-31 NOTE — Telephone Encounter (Signed)
I called the patient.  The blood work is unremarkable exception of a slightly low copper level, recommend getting a multivitamin tablet taking 1 a day with a vitamin that has a least 2 mg of copper.  I discussed this with the patient.

## 2019-05-31 NOTE — Telephone Encounter (Signed)
-----   Message from Penni Bombard, MD sent at 05/30/2019  2:29 PM EST ----- Labs ok except borderline low copper. Will ask dr Jannifer Franklin to review when he returns. -VRP

## 2019-05-31 NOTE — Patient Instructions (Addendum)
If you are age 79 or older, your body mass index should be between 23-30. Your Body mass index is 20.73 kg/m. If this is out of the aforementioned range listed, please consider follow up with your Primary Care Provider.  If you are age 66 or younger, your body mass index should be between 19-25. Your Body mass index is 20.73 kg/m. If this is out of the aformentioned range listed, please consider follow up with your Primary Care Provider.   STOP  Phenergan  We are giving you a sample of FDGard. Take 1 capsule three times a day. You can purchase more over the counter if they are helpful.  We have sent the following medications to your pharmacy for you to pick up at your convenience: Linzess 68mcg: Take once daily in the morning  Please purchase the following medications over the counter and take as directed: Miralax: Take one capful in water or juice every day  Thank you for entrusting me with your care and for choosing [employer], Dr. Pat Kocher
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
## 2019-06-01 ENCOUNTER — Telehealth: Payer: Self-pay | Admitting: Neurology

## 2019-06-01 NOTE — Telephone Encounter (Signed)
Pt states Dr Jannifer Franklin called her yesterday and suggested that she takes a multi vitamin with copper.  Pt would like a call back re: why or how the copper will benefit her.  Please call

## 2019-06-01 NOTE — Telephone Encounter (Signed)
Dr. Jannifer Franklin to address this on Monday when he gets back

## 2019-06-02 ENCOUNTER — Telehealth: Payer: Self-pay | Admitting: Neurology

## 2019-06-02 ENCOUNTER — Other Ambulatory Visit: Payer: Self-pay

## 2019-06-02 ENCOUNTER — Ambulatory Visit (HOSPITAL_COMMUNITY)
Admission: RE | Admit: 2019-06-02 | Discharge: 2019-06-02 | Disposition: A | Discharge status: Home / Self Care | Payer: Medicare Other | Source: Ambulatory Visit | Attending: Neurology | Admitting: Neurology

## 2019-06-02 DIAGNOSIS — R42 Dizziness and giddiness: Secondary | ICD-10-CM

## 2019-06-02 DIAGNOSIS — R55 Syncope and collapse: Secondary | ICD-10-CM | POA: Insufficient documentation

## 2019-06-02 NOTE — Telephone Encounter (Signed)
I called the patient.  The carotid Doppler study was unremarkable.  The patient had a dizzy spell just before the procedure was done, no one ever checked a pulse or a blood pressure during the event.  Patient denies any chest pain, shortness of breath, palpitations of the heart, or dimming of vision.  She did not have syncope.   I will go ahead and get a 30-day cardiac monitor study, the cause of these episodes are still not clear.    Carotid doppler 06/02/19:  Summary:  Right Carotid: Velocities in the right ICA are consistent with a 1-39%  stenosis.   Left Carotid: Velocities in the left ICA are consistent with a 1-39%  stenosis.   Vertebrals: Bilateral vertebral arteries demonstrate antegrade flow.

## 2019-06-02 NOTE — Telephone Encounter (Signed)
I called the patient, I discussed getting a multivitamin with 2 mg of copper, she indicated that she does have a multivitamin such as this, she will take this on a daily basis.

## 2019-06-02 NOTE — Progress Notes (Signed)
Carotid duplex has been completed.   Preliminary results in CV Proc.   Emily [name] 06/02/2019 9:57 AM

## 2019-06-06 ENCOUNTER — Telehealth: Payer: Self-pay | Admitting: Radiology

## 2019-06-06 ENCOUNTER — Encounter: Payer: Self-pay | Admitting: Gastroenterology

## 2019-06-06 NOTE — Telephone Encounter (Signed)
Enrolled patient for a 30 day [employer] Event monitor to be mailed to patients home. Brief instructions were gone over with the patient and she knows to expect the monitor to arrive in 5-7 days.

## 2019-06-08 ENCOUNTER — Inpatient Hospital Stay (HOSPITAL_BASED_OUTPATIENT_CLINIC_OR_DEPARTMENT_OTHER): Payer: Medicare Other | Admitting: Medical

## 2019-06-08 ENCOUNTER — Other Ambulatory Visit: Payer: Self-pay

## 2019-06-08 ENCOUNTER — Inpatient Hospital Stay: Payer: Medicare Other

## 2019-06-08 ENCOUNTER — Inpatient Hospital Stay: Payer: Medicare Other | Attending: Hematology

## 2019-06-08 VITALS — BP 120/70 | HR 70 | Temp 98.0°F | Resp 16

## 2019-06-08 DIAGNOSIS — C7A8 Other malignant neuroendocrine tumors: Secondary | ICD-10-CM

## 2019-06-08 DIAGNOSIS — E538 Deficiency of other specified B group vitamins: Secondary | ICD-10-CM

## 2019-06-08 DIAGNOSIS — D519 Vitamin B12 deficiency anemia, unspecified: Secondary | ICD-10-CM

## 2019-06-08 LAB — CBC WITH DIFFERENTIAL/PLATELET
Abs Immature Granulocytes: 0.02 10*3/uL (ref 0.00–0.07)
Basophils Absolute: 0 10*3/uL (ref 0.0–0.1)
Basophils Relative: 1 %
Eosinophils Absolute: 0.1 10*3/uL (ref 0.0–0.5)
Eosinophils Relative: 1 %
HCT: 31.4 % — ABNORMAL LOW (ref 36.0–46.0)
Hemoglobin: 10.1 g/dL — ABNORMAL LOW (ref 12.0–15.0)
Immature Granulocytes: 0 %
Lymphocytes Relative: 29 %
Lymphs Abs: 1.8 10*3/uL (ref 0.7–4.0)
MCH: 24.3 pg — ABNORMAL LOW (ref 26.0–34.0)
MCHC: 32.2 g/dL (ref 30.0–36.0)
MCV: 75.5 fL — ABNORMAL LOW (ref 80.0–100.0)
Monocytes Absolute: 0.4 10*3/uL (ref 0.1–1.0)
Monocytes Relative: 6 %
Neutro Abs: 3.9 10*3/uL (ref 1.7–7.7)
Neutrophils Relative %: 63 %
Platelets: 213 10*3/uL (ref 150–400)
RBC: 4.16 MIL/uL (ref 3.87–5.11)
RDW: 15.4 % (ref 11.5–15.5)
WBC: 6.2 10*3/uL (ref 4.0–10.5)
nRBC: 0 % (ref 0.0–0.2)

## 2019-06-08 LAB — CMP (CANCER CENTER ONLY)
ALT: 27 U/L (ref 0–44)
AST: 40 U/L (ref 15–41)
Albumin: 3.2 g/dL — ABNORMAL LOW (ref 3.5–5.0)
Alkaline Phosphatase: 73 U/L (ref 38–126)
Anion gap: 5 (ref 5–15)
BUN: 15 mg/dL (ref 8–23)
CO2: 24 mmol/L (ref 22–32)
Calcium: 8.4 mg/dL — ABNORMAL LOW (ref 8.9–10.3)
Chloride: 112 mmol/L — ABNORMAL HIGH (ref 98–111)
Creatinine: 0.77 mg/dL (ref 0.44–1.00)
GFR, Est AFR Am: >60 mL/min (ref >60)
GFR, Estimated: >60 mL/min (ref >60)
Glucose, Bld: 88 mg/dL (ref 70–99)
Potassium: 4.2 mmol/L (ref 3.5–5.1)
Sodium: 141 mmol/L (ref 135–145)
Total Bilirubin: 0.4 mg/dL (ref 0.3–1.2)
Total Protein: 5.8 g/dL — ABNORMAL LOW (ref 6.5–8.1)

## 2019-06-08 MED ORDER — CYANOCOBALAMIN 1000 MCG/ML IJ SOLN
INTRAMUSCULAR | Status: AC
  Filled 2019-06-08: qty 1

## 2019-06-08 MED ORDER — CYANOCOBALAMIN 1000 MCG/ML IJ SOLN
1000.0000 ug | Freq: Once | INTRAMUSCULAR | Status: AC
  Administered 2019-06-08: 1000 ug via INTRAMUSCULAR

## 2019-06-08 NOTE — Progress Notes (Signed)
Pt filled out walk-in form today for [facility] (already has lab and injection appt scheduled).  Pt to be seen during injection appt today by PA Van in flush room.

## 2019-06-08 NOTE — Patient Instructions (Signed)
Cyanocobalamin, Pyridoxine, and Folate What is this medicine? A multivitamin containing folic acid, vitamin B6, and vitamin B12. This medicine may be used for other purposes; ask your health care provider or pharmacist if you have questions. COMMON BRAND NAME(S): AllanFol RX, AllanTex, Av-Vite FB, B Complex with Folic Acid, ComBgen, FaBB, Folamin, Folastin, Folbalin, Folbee, Folbic, Folcaps, Folgard, Folgard RX, Folgard RX 2.2, Folplex, Folplex 2.2, Foltabs 800, Foltx, Homocysteine Formula, Niva-Fol, NuFol, TL Gard RX, Virt-Gard, Virt-Vite, Virt-Vite Forte, Vita-Respa What should I tell my health care provider before I take this medicine? They need to know if you have any of these conditions:  bleeding or clotting disorder  history of anemia of any type  other chronic health condition  an unusual or allergic reaction to vitamins, other medicines, foods, dyes, or preservatives  pregnant or trying to get pregnant  breast-feeding How should I use this medicine? Take by mouth with a glass of water. May take with food. Follow the directions on the prescription label. It is usually given once a day. Do not take your medicine more often than directed. Contact your pediatrician regarding the use of this medicine in children. Special care may be needed. Overdosage: If you think you have taken too much of this medicine contact a poison control center or emergency room at once. NOTE: This medicine is only for you. Do not share this medicine with others. What if I miss a dose? If you miss a dose, take it as soon as you can. If it is almost time for your next dose, take only that dose. Do not take double or extra doses. What may interact with this medicine?  levodopa This list may not describe all possible interactions. Give your health care provider a list of all the medicines, herbs, non-prescription drugs, or dietary supplements you use. Also tell them if you smoke, drink alcohol, or use illegal  drugs. Some items may interact with your medicine. What should I watch for while using this medicine? See your health care professional for regular checks on your progress. Remember that vitamin supplements do not replace the need for good nutrition from a balanced diet. What side effects may I notice from receiving this medicine? Side effects that you should report to your doctor or health care professional as soon as possible:  allergic reaction such as skin rash or difficulty breathing  vomiting Side effects that usually do not require medical attention (report to your doctor or health care professional if they continue or are bothersome):  nausea  stomach upset This list may not describe all possible side effects. Call your doctor for medical advice about side effects. You may report side effects to FDA at 1-800-FDA-1088. Where should I keep my medicine? Keep out of the reach of children. Most vitamins should be stored at controlled room temperature. Check your specific product directions. Protect from heat and moisture. Throw away any unused medicine after the expiration date. NOTE: This sheet is a summary. It may not cover all possible information. If you have questions about this medicine, talk to your doctor, pharmacist, or health care provider.  2020 Elsevier/Gold Standard (2007-06-11 00:59:55)  

## 2019-06-08 NOTE — Progress Notes (Signed)
The patient is an elderly female who is followed by Dr. Burr Medico for a history of a carcinoid tumor of the appendix which is incidentally found as part of an appendectomy and for vitamin B12 deficiency.  The patient is followed conservatively.  She completed a walk-in form today and stated that she was having dizziness which she reports her having over a year.  She has seen her primary care provider and neurology.  She reports having a carotid ultrasound completed recently which by her report was negative.  She also reports having a brain MRI completed which showed "shrinking of the brain" and a history of "mini strokes."  She reports that she was told that this was the reason that she was having dizziness.  She is pending a Holter monitor study.  She simply wanted to make her office aware of these issues and what had been done thus far.  Sandi Mealy, MHS, PA-C Physician Assistant

## 2019-06-09 LAB — CHROMOGRANIN A: Chromogranin A (ng/mL): 74.5 ng/mL (ref 0.0–101.8)

## 2019-06-12 ENCOUNTER — Other Ambulatory Visit: Payer: Self-pay | Admitting: Hematology

## 2019-06-12 DIAGNOSIS — D649 Anemia, unspecified: Secondary | ICD-10-CM

## 2019-06-13 ENCOUNTER — Ambulatory Visit (INDEPENDENT_AMBULATORY_CARE_PROVIDER_SITE_OTHER): Payer: Medicare Other

## 2019-06-13 DIAGNOSIS — R55 Syncope and collapse: Secondary | ICD-10-CM

## 2019-06-13 DIAGNOSIS — R42 Dizziness and giddiness: Secondary | ICD-10-CM

## 2019-06-14 LAB — METHYLMALONIC ACID, SERUM: Methylmalonic Acid, Quantitative: 281 nmol/L (ref 0–378)

## 2019-06-16 DIAGNOSIS — E139 Other specified diabetes mellitus without complications: Secondary | ICD-10-CM | POA: Diagnosis not present

## 2019-06-16 DIAGNOSIS — K922 Gastrointestinal hemorrhage, unspecified: Secondary | ICD-10-CM | POA: Diagnosis not present

## 2019-06-16 DIAGNOSIS — M5136 Other intervertebral disc degeneration, lumbar region: Secondary | ICD-10-CM | POA: Diagnosis not present

## 2019-06-19 ENCOUNTER — Other Ambulatory Visit: Payer: Self-pay | Admitting: Family

## 2019-06-19 DIAGNOSIS — Z1231 Encounter for screening mammogram for malignant neoplasm of breast: Secondary | ICD-10-CM

## 2019-06-20 ENCOUNTER — Ambulatory Visit: Payer: Medicare Other | Admitting: Gastroenterology

## 2019-06-21 NOTE — Telephone Encounter (Signed)
I called the patient.  The patient does have a heart monitor on, she has had several episodes of dizziness again with standing.  When she stands up, she might get some shaking of the legs, suggestive of low blood pressure.  She does have a blood pressure cuff at home which she has not checked her blood pressure during these events.  I have asked her to try to get a blood pressure if he starts feeling bad again.  Heart monitor will pick up on any heart rhythm problems if this is the issue but not a drop in blood pressure.

## 2019-06-21 NOTE — Telephone Encounter (Signed)
Pt requesting a call from RN/MD stating that she has has felt extremly dizzy "head spinning", with her legs and arms feeling "shaky" unsure the reason states it last about 10 min each time. Please call to advise.

## 2019-06-22 ENCOUNTER — Telehealth: Payer: Self-pay

## 2019-06-22 ENCOUNTER — Inpatient Hospital Stay: Payer: Medicare Other

## 2019-06-22 NOTE — Telephone Encounter (Signed)
Left message for patient/family to call back to reschedule the missed injection today.

## 2019-06-26 ENCOUNTER — Telehealth: Payer: Self-pay | Admitting: Neurology

## 2019-06-26 ENCOUNTER — Other Ambulatory Visit: Payer: Self-pay | Admitting: Gastroenterology

## 2019-06-26 NOTE — Telephone Encounter (Signed)
I called pt about her blood pressure readings and heart rate. Pt state she had to get another device for the heart monitor because it was not reading properly. Her daughter will come by this evening and place the device. I stated per Dr. Leonie Man he review Dr. Jannifer Franklin notes and agreed to wear monitor and get blood pressure when she feels shaky. I stated Dr Leonie Man was okay with the current blood pressure readings at this time. I also stated Dr Leonie Man stated the heart monitor is to see what rhythm she is in when she is having a episode. Pt stated no dizzy spells today just shaking of the legs. I stated Dr.SEthi recommend getting up slowly dont turn her head too quick and use walker with a person assistance if necessary. Pt does have the number of the heart monitor device to call for any trouble shooting issue and verbalized understanding.

## 2019-06-26 NOTE — Telephone Encounter (Signed)
Pt called and LVM stating that she had an episode today and her BP was 136/74 Pulse was 118 and on Friday her BP was 148/72 Pulse 108 Pt would like to discuss this with provider to see if this is something she should be worrying about. Please advise.

## 2019-06-26 NOTE — Telephone Encounter (Signed)
May I refill this, thank you.

## 2019-06-27 ENCOUNTER — Other Ambulatory Visit: Payer: Self-pay

## 2019-06-27 ENCOUNTER — Encounter (HOSPITAL_COMMUNITY): Payer: Self-pay | Admitting: Emergency Medicine

## 2019-06-27 ENCOUNTER — Emergency Department (HOSPITAL_COMMUNITY)
Admission: EM | Admit: 2019-06-27 | Discharge: 2019-06-27 | Disposition: A | Discharge status: Home / Self Care | Payer: Medicare Other | Attending: Emergency Medicine | Admitting: Emergency Medicine

## 2019-06-27 DIAGNOSIS — E119 Type 2 diabetes mellitus without complications: Secondary | ICD-10-CM | POA: Diagnosis not present

## 2019-06-27 DIAGNOSIS — I2510 Atherosclerotic heart disease of native coronary artery without angina pectoris: Secondary | ICD-10-CM | POA: Insufficient documentation

## 2019-06-27 DIAGNOSIS — Z87442 Personal history of urinary calculi: Secondary | ICD-10-CM | POA: Diagnosis not present

## 2019-06-27 DIAGNOSIS — G250 Essential tremor: Secondary | ICD-10-CM

## 2019-06-27 DIAGNOSIS — R251 Tremor, unspecified: Secondary | ICD-10-CM | POA: Diagnosis not present

## 2019-06-27 DIAGNOSIS — Z79899 Other long term (current) drug therapy: Secondary | ICD-10-CM | POA: Diagnosis not present

## 2019-06-27 DIAGNOSIS — Z86718 Personal history of other venous thrombosis and embolism: Secondary | ICD-10-CM | POA: Diagnosis not present

## 2019-06-27 DIAGNOSIS — I10 Essential (primary) hypertension: Secondary | ICD-10-CM | POA: Diagnosis not present

## 2019-06-27 DIAGNOSIS — R42 Dizziness and giddiness: Secondary | ICD-10-CM | POA: Diagnosis present

## 2019-06-27 LAB — CBG MONITORING, ED: Glucose-Capillary: 73 mg/dL (ref 70–99)

## 2019-06-27 LAB — CBC
HCT: 35.1 % — ABNORMAL LOW (ref 36.0–46.0)
Hemoglobin: 10.7 g/dL — ABNORMAL LOW (ref 12.0–15.0)
MCH: 24.4 pg — ABNORMAL LOW (ref 26.0–34.0)
MCHC: 30.5 g/dL (ref 30.0–36.0)
MCV: 80.1 fL (ref 80.0–100.0)
Platelets: 237 10*3/uL (ref 150–400)
RBC: 4.38 MIL/uL (ref 3.87–5.11)
RDW: 15.2 % (ref 11.5–15.5)
WBC: 6.5 10*3/uL (ref 4.0–10.5)
nRBC: 0 % (ref 0.0–0.2)

## 2019-06-27 LAB — BASIC METABOLIC PANEL
Anion gap: 9 (ref 5–15)
BUN: 14 mg/dL (ref 8–23)
CO2: 25 mmol/L (ref 22–32)
Calcium: 8.7 mg/dL — ABNORMAL LOW (ref 8.9–10.3)
Chloride: 109 mmol/L (ref 98–111)
Creatinine, Ser: 0.82 mg/dL (ref 0.44–1.00)
GFR calc Af Amer: >60 mL/min (ref >60)
GFR calc non Af Amer: >60 mL/min (ref >60)
Glucose, Bld: 87 mg/dL (ref 70–99)
Potassium: 4.5 mmol/L (ref 3.5–5.1)
Sodium: 143 mmol/L (ref 135–145)

## 2019-06-27 LAB — URINALYSIS, ROUTINE W REFLEX MICROSCOPIC
Bilirubin Urine: NEGATIVE
Glucose, UA: NEGATIVE mg/dL
Hgb urine dipstick: NEGATIVE
Ketones, ur: NEGATIVE mg/dL
Leukocytes,Ua: NEGATIVE
Nitrite: NEGATIVE
Protein, ur: NEGATIVE mg/dL
Specific Gravity, Urine: 1.025 (ref 1.005–1.030)
pH: 6 (ref 5.0–8.0)

## 2019-06-27 LAB — ETHANOL: Alcohol, Ethyl (B): <10 mg/dL (ref <10)

## 2019-06-27 LAB — FOLATE: Folate: 34.8 ng/mL (ref >5.9)

## 2019-06-27 LAB — VITAMIN B12: Vitamin B-12: 610 pg/mL (ref 180–914)

## 2019-06-27 LAB — CK: Total CK: 51 U/L (ref 38–234)

## 2019-06-27 IMAGING — RF DG UGI W/ SMALL BOWEL
5 series · 14 of 24 positions shown · non-contrast
Comparison: Upper GI and small-bowel follow-through 12/24/2014

CLINICAL DATA: Abdominal pain.

History of Nissen fundoplication. History of gastrectomy with
gastric jejunostomy.
EXAM:
UPPER GI SERIES WITH SMALL BOWEL FOLLOW-THROUGH
FLUOROSCOPY TIME:  Fluoroscopy Time:  0 minutes 54 second
Radiation Exposure Index (if provided by the fluoroscopic device):
Number of Acquired Spot Images: 0
TECHNIQUE: Combined double contrast and single contrast upper GI series using
effervescent crystals, thick barium, and thin barium. Subsequently,
serial images of the small bowel were obtained including spot views
of the terminal ileum.

[Series 1: one shot · 0.14mm/px · 1 of 1 slices shown (1 of 2)]
[im 1/1]
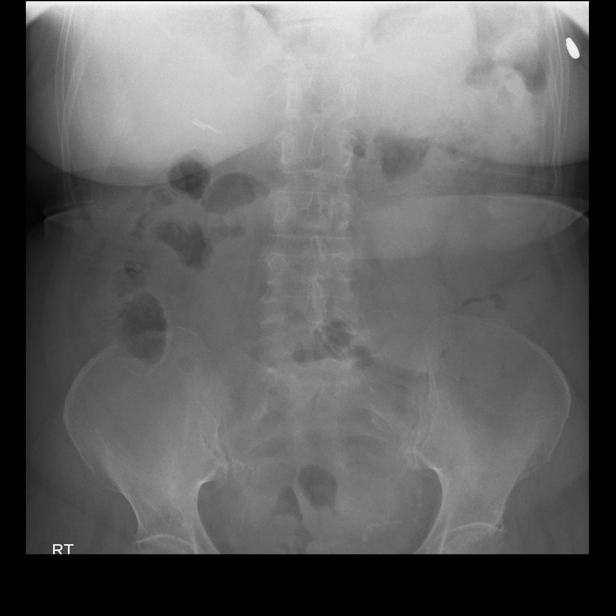

[Series 2: sequence · 0.32mm/px · 1 of 29 frames shown (1 of 3)]
[frame 17/29]
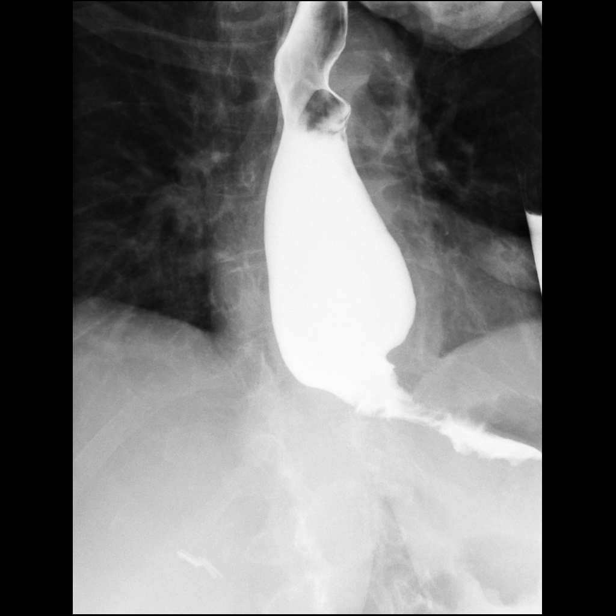

[Series 3: sequence · 0.32mm/px · 1 of 24 frames shown (2 of 3)]
[frame 4/24]
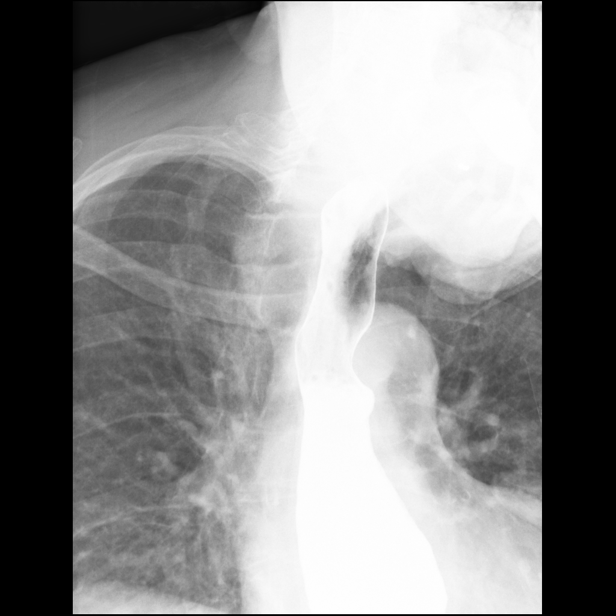

[Series 4: sequence · 0.32mm/px · 2 of 16 frames shown (3 of 3)]
[frame 3/16]
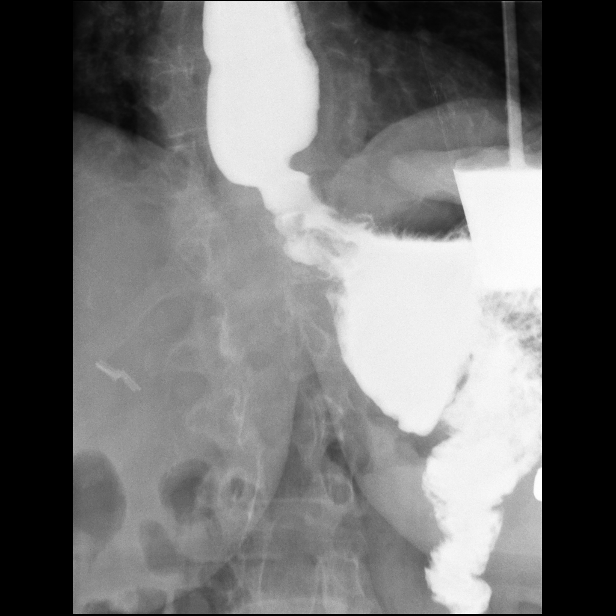
[frame 7/16]
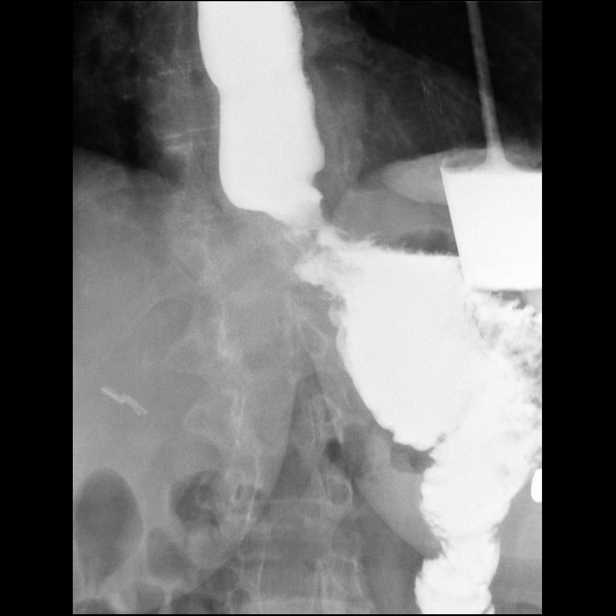

[Series 5: one shot · 0.16mm/px · 9 of 21 slices shown (2 of 2)]
[im 1/21]
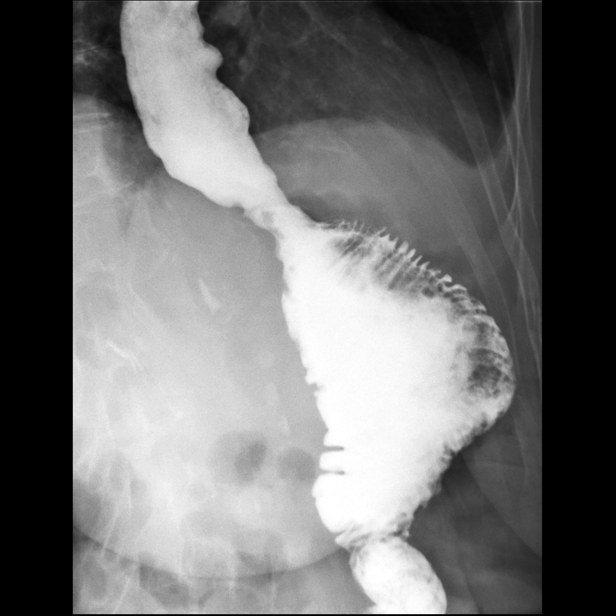
[im 4/21]
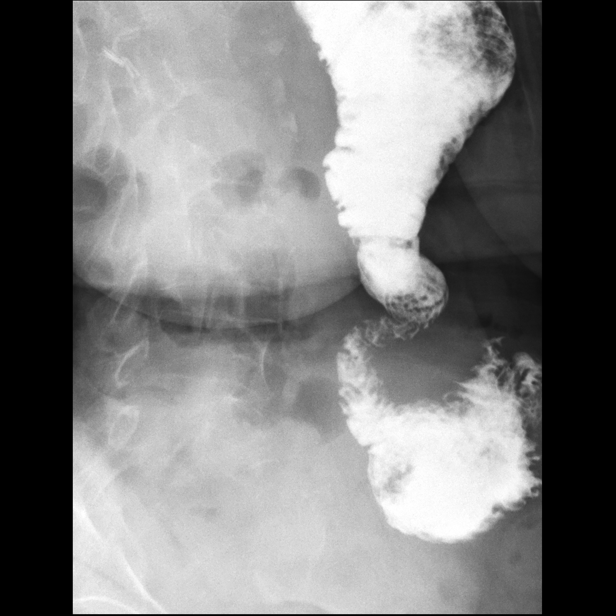
[im 5/21]
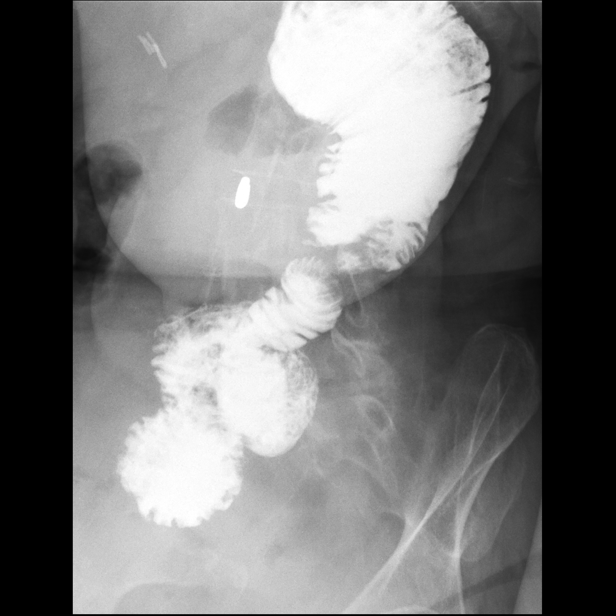
[im 8/21]
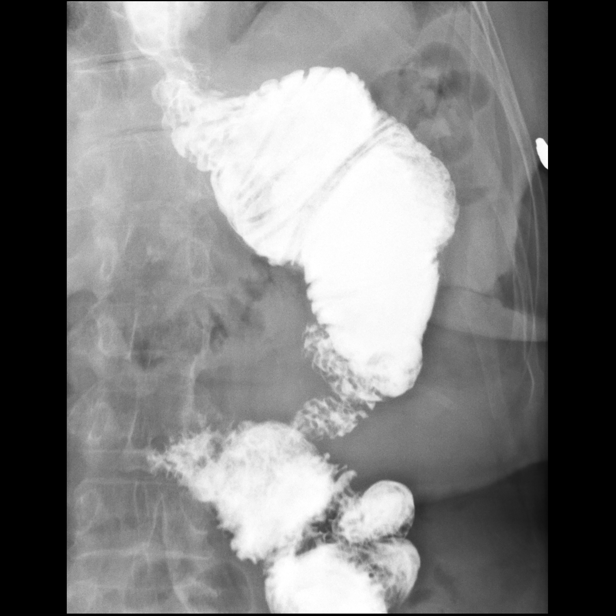
[im 11/21]
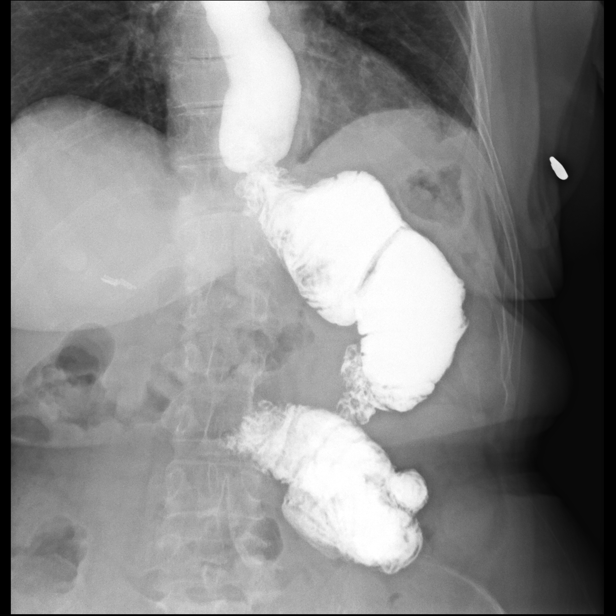
[im 13/21]
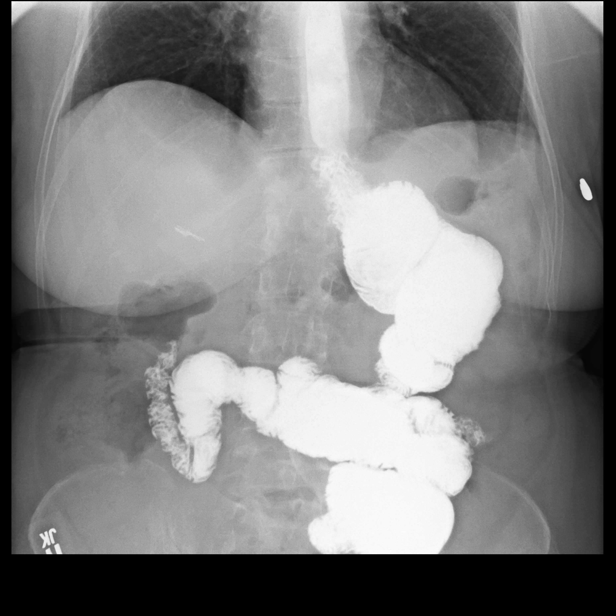
[im 15/21]
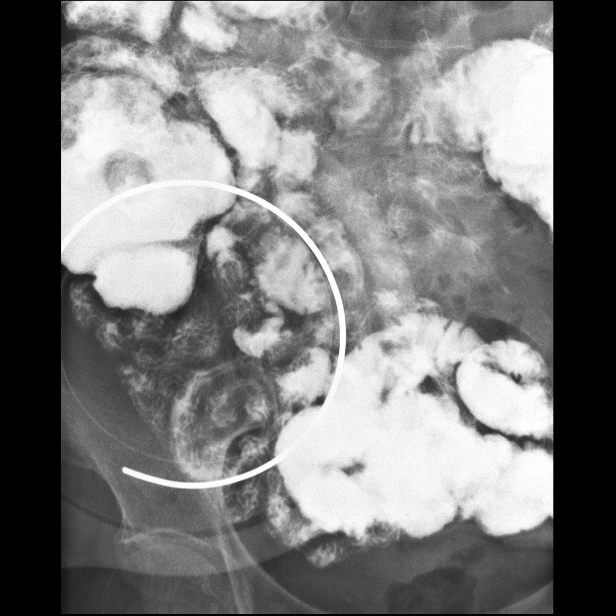
[im 17/21]
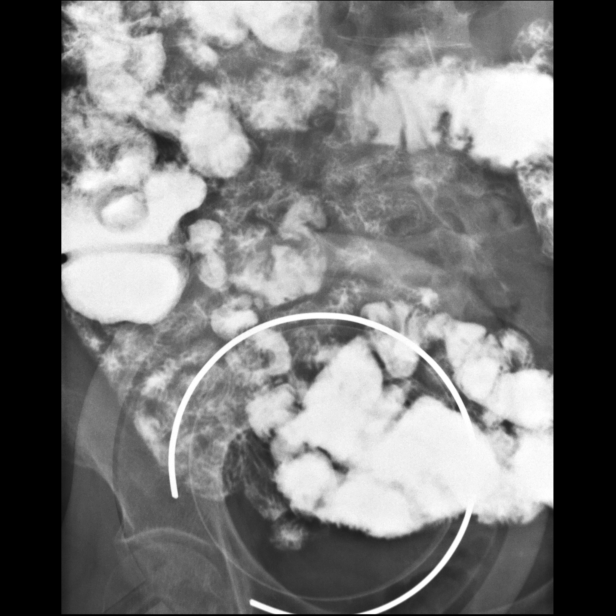
[im 21/21]
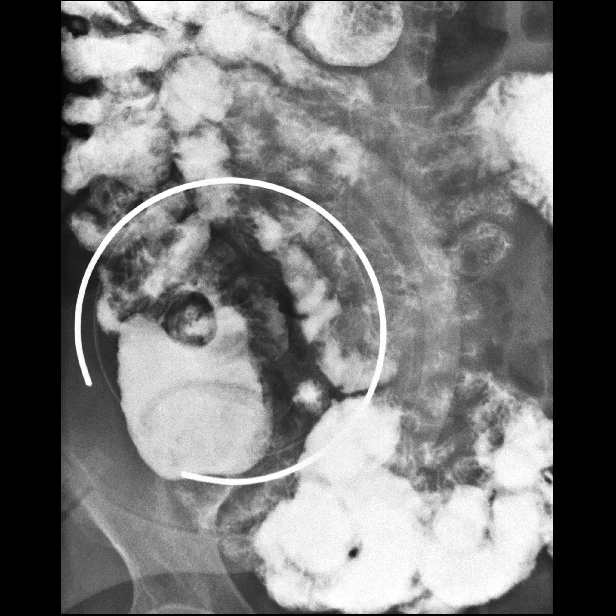

[14 of 24 positions shown; findings below may reference images not displayed]

FINDINGS: Preliminary KUB demonstrates normal bowel gas pattern. Surgical
clips in the gallbladder fossa.

Marked esophageal dilatation diffusely similar to the prior study.
Prior Nissen fundoplication. No significant hiatal hernia. Extensive
gastroesophageal reflux.

Postop gastrectomy. Small residual stomach. Rapid emptying into the
jejunum which is moderately dilated as noted previously. No
obstruction. Barium passes readily through the small bowel and
enters the right colon at 1 hour. Negative for ulcer or mass lesion.
Terminal ileum normal. No bowel edema.
IMPRESSION: Poor esophageal motility. Esophagus is diffusely dilated similar to
the prior study. Extensive gastroesophageal reflux. Negative for
hiatal hernia.

Postop gastrectomy with gastrojejunostomy. Proximal jejunum is
moderately dilated as seen previously. No obstruction of small bowel
with barium entering the right colon at 1 hour

PLEASE PICK CORRECT UPPER GI TEMPLATE

## 2019-06-27 MED ORDER — ACETAMINOPHEN 325 MG PO TABS
650.0000 mg | ORAL_TABLET | Freq: Once | ORAL | Status: AC
  Administered 2019-06-27: 650 mg via ORAL
  Filled 2019-06-27: qty 2

## 2019-06-27 MED ORDER — SODIUM CHLORIDE 0.9% FLUSH: 3.0000 mL | Freq: Once | INTRAVENOUS | Status: DC

## 2019-06-27 NOTE — Discharge Instructions (Signed)
You have been evaluated for your tremors.  We have request physical therapy and occupational therapy to evaluate you and provide assistance at home.  Please also call and follow up closely with your neurologist for further care.

## 2019-06-27 NOTE — ED Notes (Signed)
Went in to assist pt to the restroom. Pt was unable to take steps due to her legs shaking. Pt was able to be transported in a wheel chair where she continued having a hard time standing once inside the restroom.

## 2019-06-27 NOTE — ED Triage Notes (Signed)
Pt arrives to ED from home with complaints dizzy spells x1 year and complaints of her legs shaking x1 year. Patient states that she comes in today because she is now having trouble walking due to this.

## 2019-06-27 NOTE — Care Management (Signed)
ED CM met with patient at bedside to discuss [facility] RN PT/OT recommendations.  Patient is agreeable.  Offered choice patient states she does not have a preference. CM offered to fax referral to several [employer] agencies from the preferred list.  Patient is agreeable. Faxed to [facility].

## 2019-06-27 NOTE — ED Notes (Signed)
Emily [name]- daughter- [phone]- would like to be contacted with more information
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
## 2019-06-27 NOTE — ED Notes (Signed)
Patient verbalizes understanding of discharge instructions . Opportunity for questions and answers were provided . Armband removed by staff ,Pt discharged from ED. W/C  offered at D/C  and Declined W/C at D/C and was escorted to lobby by RN.  

## 2019-06-27 NOTE — Telephone Encounter (Signed)
I called pts daughter [name] about her mom unable to walk or get out of bed. I stated Dr. Leonie Man reviewed her chart and last phone notes from Dr. Jannifer Franklin. I stated per Dr. Leonie Man if she is getting worse with difficulty standing and unable to walk to seek the nearest ED for her to be evaluated. The daughter stated the symptoms are getting worse and verbalized understanding to seek nearest ED.

## 2019-06-27 NOTE — Telephone Encounter (Signed)
Patient daughter called requesting to speak to RN in regards to patient states she is having difficulty standing and is unable to walk.   Please follow up

## 2019-06-28 ENCOUNTER — Ambulatory Visit: Payer: Medicare Other | Admitting: Podiatry

## 2019-06-28 NOTE — Telephone Encounter (Signed)
I called Emily [name] back about wanting to schedule a follow up with Dr. Jannifer Franklin per ED notes. Emily [name] was recently seen in the ED on 06/27/2019 for weakness and tremors. I stated there are no available appts for this month. I stated people do cancel appts daily. I stated message will be sent to Dr. Arlie Solomons for review about her ED visit and appt.. Emily [name] verbalized understanding.

## 2019-06-28 NOTE — Telephone Encounter (Signed)
Pt called and LVM stating that she went to the ED yesterday and was told to schedule an appt as soon as possible to see Dr. Jannifer Franklin. Provider does not have anything available soon enough. Please advise.

## 2019-06-30 NOTE — Telephone Encounter (Signed)
We will need to see the results of the cardiac monitor, if this is unremarkable, will get patient set up for physical therapy working on gait training.  The source of her dizziness and shaky legs is not clear.  The patient is on low-dose gabapentin 300 mg 3 times daily if needed, has normal renal function.

## 2019-07-03 ENCOUNTER — Encounter: Payer: Self-pay | Admitting: Podiatry

## 2019-07-03 ENCOUNTER — Ambulatory Visit (INDEPENDENT_AMBULATORY_CARE_PROVIDER_SITE_OTHER): Payer: Medicare Other | Admitting: Podiatry

## 2019-07-03 ENCOUNTER — Other Ambulatory Visit: Payer: Self-pay

## 2019-07-03 VITALS — Temp 97.8°F

## 2019-07-03 DIAGNOSIS — E0842 Diabetes mellitus due to underlying condition with diabetic polyneuropathy: Secondary | ICD-10-CM

## 2019-07-03 DIAGNOSIS — B353 Tinea pedis: Secondary | ICD-10-CM

## 2019-07-03 DIAGNOSIS — M79675 Pain in left toe(s): Secondary | ICD-10-CM | POA: Diagnosis not present

## 2019-07-03 DIAGNOSIS — M79674 Pain in right toe(s): Secondary | ICD-10-CM | POA: Diagnosis not present

## 2019-07-03 DIAGNOSIS — B351 Tinea unguium: Secondary | ICD-10-CM

## 2019-07-03 MED ORDER — CLOTRIMAZOLE 1 % EX CREA: TOPICAL_CREAM | CUTANEOUS | 1 refills | Status: DC

## 2019-07-03 NOTE — Telephone Encounter (Signed)
Pt left a VM asking for an appt with Dr. Jannifer Franklin to discuss her leg tremors. She can be reached at [phone].

## 2019-07-03 NOTE — Telephone Encounter (Signed)
I reached out to the pt's aid and requested she have the pt call back so we could try and get her worked in for a sooner appt.

## 2019-07-03 NOTE — Patient Instructions (Signed)

## 2019-07-04 DIAGNOSIS — Z79899 Other long term (current) drug therapy: Secondary | ICD-10-CM | POA: Diagnosis not present

## 2019-07-04 DIAGNOSIS — R42 Dizziness and giddiness: Secondary | ICD-10-CM | POA: Diagnosis not present

## 2019-07-04 DIAGNOSIS — E46 Unspecified protein-calorie malnutrition: Secondary | ICD-10-CM | POA: Diagnosis not present

## 2019-07-04 DIAGNOSIS — G250 Essential tremor: Secondary | ICD-10-CM | POA: Diagnosis not present

## 2019-07-04 DIAGNOSIS — I129 Hypertensive chronic kidney disease with stage 1 through stage 4 chronic kidney disease, or unspecified chronic kidney disease: Secondary | ICD-10-CM | POA: Diagnosis not present

## 2019-07-04 NOTE — Telephone Encounter (Signed)
This encounter was created in error - please disregard.

## 2019-07-04 NOTE — Telephone Encounter (Signed)
I called pt. I offered her an appt with Dr. Jannifer Franklin tomorrow at 2pm, check in at 1:30, which pt accepted. Pt verbalized understanding of new appt date and time.

## 2019-07-05 ENCOUNTER — Encounter: Payer: Self-pay | Admitting: Neurology

## 2019-07-05 ENCOUNTER — Other Ambulatory Visit: Payer: Self-pay

## 2019-07-05 ENCOUNTER — Ambulatory Visit (INDEPENDENT_AMBULATORY_CARE_PROVIDER_SITE_OTHER): Payer: Medicare Other | Admitting: Neurology

## 2019-07-05 VITALS — BP 148/84 | HR 67 | Temp 97.5°F | Ht 63.0 in | Wt 117.0 lb

## 2019-07-05 DIAGNOSIS — R42 Dizziness and giddiness: Secondary | ICD-10-CM | POA: Diagnosis not present

## 2019-07-05 DIAGNOSIS — R269 Unspecified abnormalities of gait and mobility: Secondary | ICD-10-CM | POA: Diagnosis not present

## 2019-07-05 DIAGNOSIS — G252 Other specified forms of tremor: Secondary | ICD-10-CM | POA: Diagnosis not present

## 2019-07-05 HISTORY — DX: Dizziness and giddiness: R42
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
## 2019-07-06 ENCOUNTER — Other Ambulatory Visit: Payer: Self-pay

## 2019-07-06 ENCOUNTER — Inpatient Hospital Stay: Payer: Medicare Other | Attending: Hematology

## 2019-07-06 DIAGNOSIS — C7A8 Other malignant neuroendocrine tumors: Secondary | ICD-10-CM | POA: Insufficient documentation

## 2019-07-06 DIAGNOSIS — E538 Deficiency of other specified B group vitamins: Secondary | ICD-10-CM | POA: Insufficient documentation

## 2019-07-06 DIAGNOSIS — Z79899 Other long term (current) drug therapy: Secondary | ICD-10-CM | POA: Insufficient documentation

## 2019-07-06 DIAGNOSIS — D519 Vitamin B12 deficiency anemia, unspecified: Secondary | ICD-10-CM

## 2019-07-06 MED ORDER — CYANOCOBALAMIN 1000 MCG/ML IJ SOLN
1000.0000 ug | Freq: Once | INTRAMUSCULAR | Status: AC
  Administered 2019-07-06: 13:00:00 1000 ug via INTRAMUSCULAR

## 2019-07-06 MED ORDER — CYANOCOBALAMIN 1000 MCG/ML IJ SOLN
INTRAMUSCULAR | Status: AC
  Filled 2019-07-06: qty 1

## 2019-07-06 NOTE — Patient Instructions (Signed)

## 2019-07-06 NOTE — Progress Notes (Signed)
Subjective: Emily [name] presents today for follow up of preventative diabetic foot care and painful mycotic nails b/l that are difficult to trim. Pain interferes with ambulation. Aggravating factors include wearing enclosed shoe gear. Pain is relieved with periodic professional debridement.   Patient c/o itching b/l feet for the past several weeks.   No Known Allergies   Objective: Vitals:   07/03/19 1049  Temp: 97.8 F (36.6 C)    Vascular Examination:  Capillary refill time to digits immediate b/l. Palpable DP pulses b/l. Palpable PT pulses b/l. Pedal hair absent b/l Skin temperature gradient within normal limits b/l.  Dermatological Examination: Pedal skin with normal turgor, texture and tone bilaterally. No open wounds bilaterally. No interdigital macerations bilaterally. Toenails 1-5 b/l elongated, dystrophic, thickened, crumbly with subungual debris and tenderness to dorsal palpation. Diffuse scaling noted peripherally and plantarly b/l feet with mild foot odor.  No interdigital macerations.  No blisters, no weeping. No signs of secondary bacterial infection noted.  Musculoskeletal: Normal muscle strength 5/5 to all lower extremity muscle groups bilaterally, no gross bony deformities bilaterally and no pain crepitus or joint limitation noted with ROM b/l  Neurological: Protective sensation absent with 10g monofilament b/l  Assessment: 1. Pain due to onychomycosis of toenails of both feet   2. Tinea pedis of both feet   3. Diabetes mellitus due to underlying condition with diabetic polyneuropathy, unspecified whether long term insulin use ([place])    Plan: -Continue diabetic foot care principles. Literature dispensed on today.  -Toenails 1-5 b/l were debrided in length and girth with sterile nail nippers and dremel without iatrogenic bleeding.  -Patient to continue soft, supportive shoe gear daily. -Patient to report any pedal injuries to medical professional  immediately. --Rx sent to pharmacy for Clotrimazole Cream 1% to be applied to feet twice daily for 4 weeks. -Patient/POA to call should there be question/concern in the interim.  Return in about 3 months (around 10/03/2019) for nail trim.

## 2019-07-17 ENCOUNTER — Other Ambulatory Visit: Payer: Self-pay | Admitting: Neurology

## 2019-07-20 ENCOUNTER — Inpatient Hospital Stay: Payer: Medicare Other

## 2019-07-20 ENCOUNTER — Other Ambulatory Visit: Payer: Self-pay

## 2019-07-20 VITALS — BP 148/70 | HR 70 | Temp 98.2°F | Resp 18

## 2019-07-20 DIAGNOSIS — C7A8 Other malignant neuroendocrine tumors: Secondary | ICD-10-CM | POA: Diagnosis not present

## 2019-07-20 DIAGNOSIS — Z79899 Other long term (current) drug therapy: Secondary | ICD-10-CM | POA: Diagnosis not present

## 2019-07-20 DIAGNOSIS — E538 Deficiency of other specified B group vitamins: Secondary | ICD-10-CM | POA: Diagnosis not present

## 2019-07-20 DIAGNOSIS — D519 Vitamin B12 deficiency anemia, unspecified: Secondary | ICD-10-CM

## 2019-07-20 MED ORDER — CYANOCOBALAMIN 1000 MCG/ML IJ SOLN
1000.0000 ug | Freq: Once | INTRAMUSCULAR | Status: AC
  Administered 2019-07-20: 1000 ug via INTRAMUSCULAR

## 2019-07-20 MED ORDER — CYANOCOBALAMIN 1000 MCG/ML IJ SOLN
INTRAMUSCULAR | Status: AC
  Filled 2019-07-20: qty 1

## 2019-07-20 NOTE — Progress Notes (Signed)
Pt note from Dr. Burr Medico 03/2019... B-12 to be given q 2 weeks

## 2019-07-20 NOTE — Patient Instructions (Signed)

## 2019-07-24 ENCOUNTER — Telehealth: Payer: Self-pay

## 2019-07-24 NOTE — Telephone Encounter (Signed)
Voicemail left at 3:30p:  Patient states that starting yesterday she began having tremors and fluttering in her legs, she said they feel funny. She also would like to know if the heart monitor results are back.

## 2019-07-24 NOTE — Telephone Encounter (Signed)
The results from the cardiac monitor not yet available, I will call her as soon as they are sent to me.

## 2019-07-25 ENCOUNTER — Telehealth: Payer: Self-pay | Admitting: Gastroenterology

## 2019-07-25 ENCOUNTER — Other Ambulatory Visit: Payer: Self-pay | Admitting: Neurology

## 2019-07-25 DIAGNOSIS — R42 Dizziness and giddiness: Secondary | ICD-10-CM

## 2019-07-25 DIAGNOSIS — R55 Syncope and collapse: Secondary | ICD-10-CM

## 2019-07-25 NOTE — Telephone Encounter (Signed)
Please advise Dr Silverio Decamp. I see the Nexium on her list but not the promethazine. Thank you.

## 2019-07-25 NOTE — Telephone Encounter (Signed)
Patient is requesting refill on Nexium and she also would like Promethazine sent to [facility]

## 2019-07-25 NOTE — Telephone Encounter (Signed)
Okay to refill Nexium.  Please send prescription for promethazine 12.5 mg daily as needed X30 with no refills.  Thank you

## 2019-07-26 ENCOUNTER — Ambulatory Visit: Payer: Medicare Other

## 2019-07-26 MED ORDER — ESOMEPRAZOLE MAGNESIUM 40 MG PO CPDR: 40.0000 mg | DELAYED_RELEASE_CAPSULE | Freq: Two times a day (BID) | ORAL | 1 refills | Status: DC

## 2019-07-26 MED ORDER — PROMETHAZINE HCL 12.5 MG PO TABS: 12.5000 mg | ORAL_TABLET | Freq: Every day | ORAL | 0 refills | Status: DC | PRN

## 2019-07-26 NOTE — Telephone Encounter (Signed)
Medicines sent in as approved by Dr Silverio Decamp, patient informed.

## 2019-07-27 ENCOUNTER — Other Ambulatory Visit: Payer: Self-pay

## 2019-07-27 ENCOUNTER — Ambulatory Visit
Admission: RE | Admit: 2019-07-27 | Discharge: 2019-07-27 | Disposition: A | Discharge status: Home / Self Care | Payer: Medicare Other | Source: Ambulatory Visit | Attending: Family | Admitting: Family

## 2019-07-27 DIAGNOSIS — Z1231 Encounter for screening mammogram for malignant neoplasm of breast: Secondary | ICD-10-CM | POA: Diagnosis not present

## 2019-07-28 DIAGNOSIS — K922 Gastrointestinal hemorrhage, unspecified: Secondary | ICD-10-CM | POA: Diagnosis not present

## 2019-07-28 DIAGNOSIS — M5136 Other intervertebral disc degeneration, lumbar region: Secondary | ICD-10-CM | POA: Diagnosis not present

## 2019-07-28 DIAGNOSIS — E139 Other specified diabetes mellitus without complications: Secondary | ICD-10-CM | POA: Diagnosis not present

## 2019-08-01 NOTE — Progress Notes (Signed)
Pharmacist Chemotherapy Monitoring - Follow Up Assessment    I verify that I have reviewed each item in the below checklist:  . Regimen for the patient is scheduled for the appropriate day and plan matches scheduled date. Marland Kitchen Appropriate non-routine labs are ordered dependent on drug ordered. . If applicable, additional medications reviewed and ordered per protocol based on lifetime cumulative doses and/or treatment regimen.   Plan for follow-up and/or issues identified: No . I-vent associated with next due treatment: No . MD and/or nursing notified: No  Emily [name] 08/01/2019 12:27 PM

## 2019-08-02 ENCOUNTER — Other Ambulatory Visit: Payer: Self-pay | Admitting: Gastroenterology

## 2019-08-03 ENCOUNTER — Inpatient Hospital Stay: Payer: Medicare Other

## 2019-08-03 ENCOUNTER — Telehealth: Payer: Self-pay | Admitting: Neurology

## 2019-08-03 DIAGNOSIS — R269 Unspecified abnormalities of gait and mobility: Secondary | ICD-10-CM

## 2019-08-03 NOTE — Telephone Encounter (Signed)
I called the patient.  The cardiac monitor study was unremarkable.  The patient is still having occasional events of orthostatic tremor, I will get physical therapy to work with gait training and leg strengthening exercises.   Cardiac monitor 08/03/19:  Narrative & Impression    NSR Infrequent runs of NSVT with the longest run being 5 beats. Symptoms did not correlate with NSVT.  No sustained arrhythmias.

## 2019-08-07 ENCOUNTER — Other Ambulatory Visit: Payer: Self-pay

## 2019-08-07 ENCOUNTER — Inpatient Hospital Stay: Payer: Medicare Other | Attending: Hematology

## 2019-08-07 VITALS — BP 128/72 | HR 72 | Temp 98.2°F | Resp 18

## 2019-08-07 DIAGNOSIS — E538 Deficiency of other specified B group vitamins: Secondary | ICD-10-CM | POA: Insufficient documentation

## 2019-08-07 DIAGNOSIS — C7A8 Other malignant neuroendocrine tumors: Secondary | ICD-10-CM | POA: Insufficient documentation

## 2019-08-07 DIAGNOSIS — D519 Vitamin B12 deficiency anemia, unspecified: Secondary | ICD-10-CM

## 2019-08-07 MED ORDER — CYANOCOBALAMIN 1000 MCG/ML IJ SOLN
1000.0000 ug | Freq: Once | INTRAMUSCULAR | Status: AC
  Administered 2019-08-07: 13:00:00 1000 ug via INTRAMUSCULAR

## 2019-08-07 NOTE — Patient Instructions (Signed)

## 2019-08-17 ENCOUNTER — Other Ambulatory Visit: Payer: Self-pay

## 2019-08-17 ENCOUNTER — Inpatient Hospital Stay: Payer: Medicare Other

## 2019-08-17 VITALS — BP 130/66 | HR 74 | Temp 98.7°F | Resp 20

## 2019-08-17 DIAGNOSIS — D519 Vitamin B12 deficiency anemia, unspecified: Secondary | ICD-10-CM

## 2019-08-17 DIAGNOSIS — E538 Deficiency of other specified B group vitamins: Secondary | ICD-10-CM | POA: Diagnosis not present

## 2019-08-17 MED ORDER — CYANOCOBALAMIN 1000 MCG/ML IJ SOLN
INTRAMUSCULAR | Status: AC
  Filled 2019-08-17: qty 1

## 2019-08-17 MED ORDER — CYANOCOBALAMIN 1000 MCG/ML IJ SOLN
1000.0000 ug | Freq: Once | INTRAMUSCULAR | Status: AC
  Administered 2019-08-17: 1000 ug via INTRAMUSCULAR

## 2019-08-17 NOTE — Patient Instructions (Signed)
Cyanocobalamin, Pyridoxine, and Folate What is this medicine? A multivitamin containing folic acid, vitamin B6, and vitamin B12. This medicine may be used for other purposes; ask your health care provider or pharmacist if you have questions. COMMON BRAND NAME(S): AllanFol RX, AllanTex, Av-Vite FB, B Complex with Folic Acid, ComBgen, FaBB, Folamin, Folastin, Folbalin, Folbee, Folbic, Folcaps, Folgard, Folgard RX, Folgard RX 2.2, Folplex, Folplex 2.2, Foltabs 800, Foltx, Homocysteine Formula, Niva-Fol, NuFol, TL Gard RX, Virt-Gard, Virt-Vite, Virt-Vite Forte, Vita-Respa What should I tell my health care provider before I take this medicine? They need to know if you have any of these conditions:  bleeding or clotting disorder  history of anemia of any type  other chronic health condition  an unusual or allergic reaction to vitamins, other medicines, foods, dyes, or preservatives  pregnant or trying to get pregnant  breast-feeding How should I use this medicine? Take by mouth with a glass of water. May take with food. Follow the directions on the prescription label. It is usually given once a day. Do not take your medicine more often than directed. Contact your pediatrician regarding the use of this medicine in children. Special care may be needed. Overdosage: If you think you have taken too much of this medicine contact a poison control center or emergency room at once. NOTE: This medicine is only for you. Do not share this medicine with others. What if I miss a dose? If you miss a dose, take it as soon as you can. If it is almost time for your next dose, take only that dose. Do not take double or extra doses. What may interact with this medicine?  levodopa This list may not describe all possible interactions. Give your health care provider a list of all the medicines, herbs, non-prescription drugs, or dietary supplements you use. Also tell them if you smoke, drink alcohol, or use illegal  drugs. Some items may interact with your medicine. What should I watch for while using this medicine? See your health care professional for regular checks on your progress. Remember that vitamin supplements do not replace the need for good nutrition from a balanced diet. What side effects may I notice from receiving this medicine? Side effects that you should report to your doctor or health care professional as soon as possible:  allergic reaction such as skin rash or difficulty breathing  vomiting Side effects that usually do not require medical attention (report to your doctor or health care professional if they continue or are bothersome):  nausea  stomach upset This list may not describe all possible side effects. Call your doctor for medical advice about side effects. You may report side effects to FDA at 1-800-FDA-1088. Where should I keep my medicine? Keep out of the reach of children. Most vitamins should be stored at controlled room temperature. Check your specific product directions. Protect from heat and moisture. Throw away any unused medicine after the expiration date. NOTE: This sheet is a summary. It may not cover all possible information. If you have questions about this medicine, talk to your doctor, pharmacist, or health care provider.  2020 Elsevier/Gold Standard (2007-06-11 00:59:55)  

## 2019-08-21 ENCOUNTER — Telehealth: Payer: Self-pay | Admitting: Physical Medicine and Rehabilitation

## 2019-08-21 ENCOUNTER — Telehealth: Payer: Self-pay | Admitting: Gastroenterology

## 2019-08-21 NOTE — Telephone Encounter (Signed)
Ok

## 2019-08-21 NOTE — Telephone Encounter (Signed)
Spoke with the patient. She is noticing that she is choking again when she is eating her food. She has done well until recently. States she is in physical therapy now. Walking and getting her exercise. She is due follow up. Appointment scheduled with the doctor 09/08/19 at 3:00 pm. Asked her to call me if she is not able to keep this appointment and I will get her rescheduled.

## 2019-08-21 NOTE — Telephone Encounter (Signed)
Patient is requesting an injection. Bilateral L5 TF on 09/21/2018. Ok to repeat if helped, same problem/side, and no new injury?

## 2019-08-22 NOTE — Telephone Encounter (Signed)
[id number], Injection(s), anesthetic agent and/or more  Notification/Prior Authorization not required if procedure performed in Office; otherwise may be required for this service. 

## 2019-08-22 NOTE — Telephone Encounter (Signed)
Is auth needed? Scheduled for 4/26 with driver.

## 2019-08-28 ENCOUNTER — Ambulatory Visit: Payer: Self-pay

## 2019-08-28 ENCOUNTER — Ambulatory Visit (INDEPENDENT_AMBULATORY_CARE_PROVIDER_SITE_OTHER): Payer: Medicare Other | Admitting: Physical Medicine and Rehabilitation

## 2019-08-28 ENCOUNTER — Other Ambulatory Visit: Payer: Self-pay

## 2019-08-28 ENCOUNTER — Encounter: Payer: Self-pay | Admitting: Physical Medicine and Rehabilitation

## 2019-08-28 VITALS — BP 134/72 | HR 64

## 2019-08-28 DIAGNOSIS — M5416 Radiculopathy, lumbar region: Secondary | ICD-10-CM | POA: Diagnosis not present

## 2019-08-28 MED ORDER — METHYLPREDNISOLONE ACETATE 80 MG/ML IJ SUSP
40.0000 mg | Freq: Once | INTRAMUSCULAR | Status: AC
  Administered 2019-08-28: 40 mg

## 2019-08-28 NOTE — Progress Notes (Signed)
 .  Numeric Pain Rating Scale and Functional Assessment Average Pain 8   In the last MONTH (on 0-10 scale) has pain interfered with the following?  1. General activity like being  able to carry out your everyday physical activities such as walking, climbing stairs, carrying groceries, or moving a chair?  Rating(8)   +Driver, -BT, -Dye Allergies.  

## 2019-08-29 ENCOUNTER — Telehealth: Payer: Self-pay

## 2019-08-29 NOTE — Procedures (Signed)
Lumbosacral Transforaminal Epidural Steroid Injection - Sub-Pedicular Approach with Fluoroscopic Guidance  Patient: Emily [name]      Date of Birth: 11/03/1940 MRN: [id number] PCP: Sonia Side., FNP      Visit Date: 08/28/2019   Universal Protocol:    Date/Time: 08/28/2019  Consent Given By: the patient  Position: PRONE  Additional Comments: Vital signs were monitored before and after the procedure. Patient was prepped and draped in the usual sterile fashion. The correct patient, procedure, and site was verified.   Injection Procedure Details:  Procedure Site One Meds Administered:  Meds ordered this encounter  Medications  . methylPREDNISolone acetate (DEPO-MEDROL) injection 40 mg    Laterality: Bilateral  Location/Site:  L5-S1  Needle size: 22 G  Needle type: Spinal  Needle Placement: Transforaminal  Findings:    -Comments: Excellent flow of contrast along the nerve and into the epidural space.  Procedure Details: After squaring off the end-plates to get a true AP view, the C-arm was positioned so that an oblique view of the foramen as noted above was visualized. The target area is just inferior to the "nose of the scotty dog" or sub pedicular. The soft tissues overlying this structure were infiltrated with 2-3 ml. of 1% Lidocaine without Epinephrine.  The spinal needle was inserted toward the target using a "trajectory" view along the fluoroscope beam.  Under AP and lateral visualization, the needle was advanced so it did not puncture dura and was located close the 6 O'Clock position of the pedical in AP tracterory. Biplanar projections were used to confirm position. Aspiration was confirmed to be negative for CSF and/or blood. A 1-2 ml. volume of Isovue-250 was injected and flow of contrast was noted at each level. Radiographs were obtained for documentation purposes.   After attaining the desired flow of contrast documented above, a 0.5 to 1.0 ml test dose  of 0.25% Marcaine was injected into each respective transforaminal space.  The patient was observed for 90 seconds post injection.  After no sensory deficits were reported, and normal lower extremity motor function was noted,   the above injectate was administered so that equal amounts of the injectate were placed at each foramen (level) into the transforaminal epidural space.   Additional Comments:   Dressing: 2 x 2 sterile gauze and Band-Aid    Post-procedure details: Patient was observed during the procedure. Post-procedure instructions were reviewed.  Patient left the clinic in stable condition.

## 2019-08-29 NOTE — Telephone Encounter (Signed)
Ok to refill. Thanks 

## 2019-08-29 NOTE — Telephone Encounter (Signed)
Per Nandigam its been done
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
## 2019-08-29 NOTE — Telephone Encounter (Signed)
Incoming fax request from [facility] request for promethazine 12.5mg  tab 1 daily as needed for nausea

## 2019-08-31 ENCOUNTER — Other Ambulatory Visit: Payer: Self-pay

## 2019-08-31 ENCOUNTER — Inpatient Hospital Stay: Payer: Medicare Other

## 2019-08-31 VITALS — BP 136/59 | HR 66 | Temp 98.5°F | Resp 20

## 2019-08-31 DIAGNOSIS — D519 Vitamin B12 deficiency anemia, unspecified: Secondary | ICD-10-CM

## 2019-08-31 DIAGNOSIS — E538 Deficiency of other specified B group vitamins: Secondary | ICD-10-CM | POA: Diagnosis not present

## 2019-08-31 MED ORDER — CYANOCOBALAMIN 1000 MCG/ML IJ SOLN
INTRAMUSCULAR | Status: AC
  Filled 2019-08-31: qty 1

## 2019-08-31 MED ORDER — CYANOCOBALAMIN 1000 MCG/ML IJ SOLN
1000.0000 ug | Freq: Once | INTRAMUSCULAR | Status: AC
  Administered 2019-08-31: 1000 ug via INTRAMUSCULAR

## 2019-08-31 NOTE — Patient Instructions (Signed)
Cyanocobalamin, Pyridoxine, and Folate What is this medicine? A multivitamin containing folic acid, vitamin B6, and vitamin B12. This medicine may be used for other purposes; ask your health care provider or pharmacist if you have questions. COMMON BRAND NAME(S): AllanFol RX, AllanTex, Av-Vite FB, B Complex with Folic Acid, ComBgen, FaBB, Folamin, Folastin, Folbalin, Folbee, Folbic, Folcaps, Folgard, Folgard RX, Folgard RX 2.2, Folplex, Folplex 2.2, Foltabs 800, Foltx, Homocysteine Formula, Niva-Fol, NuFol, TL Gard RX, Virt-Gard, Virt-Vite, Virt-Vite Forte, Vita-Respa What should I tell my health care provider before I take this medicine? They need to know if you have any of these conditions:  bleeding or clotting disorder  history of anemia of any type  other chronic health condition  an unusual or allergic reaction to vitamins, other medicines, foods, dyes, or preservatives  pregnant or trying to get pregnant  breast-feeding How should I use this medicine? Take by mouth with a glass of water. May take with food. Follow the directions on the prescription label. It is usually given once a day. Do not take your medicine more often than directed. Contact your pediatrician regarding the use of this medicine in children. Special care may be needed. Overdosage: If you think you have taken too much of this medicine contact a poison control center or emergency room at once. NOTE: This medicine is only for you. Do not share this medicine with others. What if I miss a dose? If you miss a dose, take it as soon as you can. If it is almost time for your next dose, take only that dose. Do not take double or extra doses. What may interact with this medicine?  levodopa This list may not describe all possible interactions. Give your health care provider a list of all the medicines, herbs, non-prescription drugs, or dietary supplements you use. Also tell them if you smoke, drink alcohol, or use illegal  drugs. Some items may interact with your medicine. What should I watch for while using this medicine? See your health care professional for regular checks on your progress. Remember that vitamin supplements do not replace the need for good nutrition from a balanced diet. What side effects may I notice from receiving this medicine? Side effects that you should report to your doctor or health care professional as soon as possible:  allergic reaction such as skin rash or difficulty breathing  vomiting Side effects that usually do not require medical attention (report to your doctor or health care professional if they continue or are bothersome):  nausea  stomach upset This list may not describe all possible side effects. Call your doctor for medical advice about side effects. You may report side effects to FDA at 1-800-FDA-1088. Where should I keep my medicine? Keep out of the reach of children. Most vitamins should be stored at controlled room temperature. Check your specific product directions. Protect from heat and moisture. Throw away any unused medicine after the expiration date. NOTE: This sheet is a summary. It may not cover all possible information. If you have questions about this medicine, talk to your doctor, pharmacist, or health care provider.  2020 Elsevier/Gold Standard (2007-06-11 00:59:55)  

## 2019-09-01 ENCOUNTER — Other Ambulatory Visit: Payer: Self-pay

## 2019-09-01 MED ORDER — PROMETHAZINE HCL 12.5 MG PO TABS: 12.5000 mg | ORAL_TABLET | Freq: Every day | ORAL | 0 refills | Status: DC | PRN

## 2019-09-06 ENCOUNTER — Ambulatory Visit: Payer: Medicare Other | Admitting: Hematology

## 2019-09-06 ENCOUNTER — Other Ambulatory Visit: Payer: Medicare Other

## 2019-09-08 ENCOUNTER — Ambulatory Visit (INDEPENDENT_AMBULATORY_CARE_PROVIDER_SITE_OTHER): Payer: Medicare Other | Admitting: Gastroenterology

## 2019-09-08 ENCOUNTER — Encounter: Payer: Self-pay | Admitting: Gastroenterology

## 2019-09-08 VITALS — BP 154/70 | HR 84 | Temp 97.2°F | Ht 63.0 in | Wt 114.0 lb

## 2019-09-08 DIAGNOSIS — K649 Unspecified hemorrhoids: Secondary | ICD-10-CM

## 2019-09-08 DIAGNOSIS — K5900 Constipation, unspecified: Secondary | ICD-10-CM

## 2019-09-08 DIAGNOSIS — R059 Cough, unspecified: Secondary | ICD-10-CM

## 2019-09-08 DIAGNOSIS — R1310 Dysphagia, unspecified: Secondary | ICD-10-CM | POA: Diagnosis not present

## 2019-09-08 DIAGNOSIS — R1084 Generalized abdominal pain: Secondary | ICD-10-CM

## 2019-09-08 DIAGNOSIS — R05 Cough: Secondary | ICD-10-CM | POA: Diagnosis not present

## 2019-09-08 MED ORDER — HYDROCORTISONE ACETATE 25 MG RE SUPP
25.0000 mg | Freq: Two times a day (BID) | RECTAL | 0 refills | Status: DC
Start: 2019-09-08 — End: 2020-11-19
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
## 2019-09-08 NOTE — Patient Instructions (Signed)
If you are age 79 or older, your body mass index should be between 23-30. Your Body mass index is 20.19 kg/m. If this is out of the aforementioned range listed, please consider follow up with your Primary Care Provider.  If you are age 36 or younger, your body mass index should be between 19-25. Your Body mass index is 20.19 kg/m. If this is out of the aformentioned range listed, please consider follow up with your Primary Care Provider.    You have been scheduled for an Upper GI Series at [facility]. 09/20/2019. Your appointment is at 10:30 am Please arrive 15 minutes prior to your test for registration. Make sure not to eat or drink anything after midnight on the night before your test. If you need to reschedule, please call radiology at [phone]. ________________________________________________________________ An upper GI series uses x rays to help diagnose problems of the upper GI tract, which includes the esophagus, stomach, and duodenum. The duodenum is the first part of the small intestine. An upper GI series is conducted by a radiology technologist or a radiologist-a doctor who specializes in x-ray imaging-at a hospital or outpatient center. While sitting or standing in front of an x-ray machine, the patient drinks barium liquid, which is often white and has a chalky consistency and taste. The barium liquid coats the lining of the upper GI tract and makes signs of disease show up more clearly on x rays. X-ray video, called fluoroscopy, is used to view the barium liquid moving through the esophagus, stomach, and duodenum. Additional x rays and fluoroscopy are performed while the patient lies on an x-ray table. To fully coat the upper GI tract with barium liquid, the technologist or radiologist may press on the abdomen or ask the patient to change position. Patients hold still in various positions, allowing the technologist or radiologist to take x rays of the upper GI tract at different  angles. If a technologist conducts the upper GI series, a radiologist will later examine the images to look for problems.  This test typically takes about 1 hour to complete. __________________________________________________________________  Continue with Linzess 54mcg daily Eat small frequent meals Drink boost 1-2 cans per day.  We have sent the following medications to your pharmacy for you to pick up at your convenience:  Anusol 25mg  suppositories at bedtime for 5-7 days  Return to clinic in 3 months They will call to schedule your FEES study

## 2019-09-11 ENCOUNTER — Telehealth: Payer: Self-pay | Admitting: General Surgery

## 2019-09-11 NOTE — Telephone Encounter (Signed)
Tried to contact [place] at home # and her grandson Reggie answered and  Stated she was at rehab. She will not be home until 4:00 told him we would call her back.

## 2019-09-11 NOTE — Telephone Encounter (Signed)
-----   Message from Mauri Pole, MD sent at 09/11/2019  2:45 PM EDT ----- Regarding: RE: anusol Please advise patient to use the samples we provided or preparation H suppository OTC Thanks ----- Message ----- From: Letta Pate, CMA Sent: 09/11/2019   1:57 PM EDT To: Mauri Pole, MD Subject: Cory Roughen sent a request to change the following Drug because it is not covered by insurance. Anucort suppositories 25 mg. Please advise

## 2019-09-13 NOTE — Telephone Encounter (Signed)
Spoke with Emily [name] and explained to her that the anusol suppositories were not covered. Dr Silverio Decamp suggested preporation h suppositories. The patient verbalized understanding.

## 2019-09-13 NOTE — Telephone Encounter (Signed)
Pt returned your call.  
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
## 2019-09-14 ENCOUNTER — Inpatient Hospital Stay: Payer: Medicare Other

## 2019-09-14 ENCOUNTER — Inpatient Hospital Stay (HOSPITAL_BASED_OUTPATIENT_CLINIC_OR_DEPARTMENT_OTHER): Payer: Medicare Other | Admitting: Hematology

## 2019-09-14 ENCOUNTER — Other Ambulatory Visit: Payer: Self-pay

## 2019-09-14 ENCOUNTER — Encounter: Payer: Self-pay | Admitting: Hematology

## 2019-09-14 ENCOUNTER — Inpatient Hospital Stay: Payer: Medicare Other | Attending: Hematology

## 2019-09-14 VITALS — BP 155/57 | HR 72 | Temp 98.5°F | Resp 20 | Ht 63.0 in | Wt 114.4 lb

## 2019-09-14 DIAGNOSIS — E538 Deficiency of other specified B group vitamins: Secondary | ICD-10-CM | POA: Insufficient documentation

## 2019-09-14 DIAGNOSIS — C7A8 Other malignant neuroendocrine tumors: Secondary | ICD-10-CM | POA: Diagnosis not present

## 2019-09-14 DIAGNOSIS — D649 Anemia, unspecified: Secondary | ICD-10-CM

## 2019-09-14 DIAGNOSIS — Z79899 Other long term (current) drug therapy: Secondary | ICD-10-CM | POA: Diagnosis not present

## 2019-09-14 DIAGNOSIS — D519 Vitamin B12 deficiency anemia, unspecified: Secondary | ICD-10-CM

## 2019-09-14 LAB — CBC WITH DIFFERENTIAL/PLATELET
Abs Immature Granulocytes: 0.03 10*3/uL (ref 0.00–0.07)
Basophils Absolute: 0 10*3/uL (ref 0.0–0.1)
Basophils Relative: 0 %
Eosinophils Absolute: 0 10*3/uL (ref 0.0–0.5)
Eosinophils Relative: 0 %
HCT: 34.8 % — ABNORMAL LOW (ref 36.0–46.0)
Hemoglobin: 11.2 g/dL — ABNORMAL LOW (ref 12.0–15.0)
Immature Granulocytes: 0 %
Lymphocytes Relative: 21 %
Lymphs Abs: 1.8 10*3/uL (ref 0.7–4.0)
MCH: 24.3 pg — ABNORMAL LOW (ref 26.0–34.0)
MCHC: 32.2 g/dL (ref 30.0–36.0)
MCV: 75.7 fL — ABNORMAL LOW (ref 80.0–100.0)
Monocytes Absolute: 0.5 10*3/uL (ref 0.1–1.0)
Monocytes Relative: 5 %
Neutro Abs: 6.1 10*3/uL (ref 1.7–7.7)
Neutrophils Relative %: 74 %
Platelets: 232 10*3/uL (ref 150–400)
RBC: 4.6 MIL/uL (ref 3.87–5.11)
RDW: 15 % (ref 11.5–15.5)
WBC: 8.4 10*3/uL (ref 4.0–10.5)
nRBC: 0 % (ref 0.0–0.2)

## 2019-09-14 LAB — COMPREHENSIVE METABOLIC PANEL
ALT: 22 U/L (ref 0–44)
AST: 28 U/L (ref 15–41)
Albumin: 3.4 g/dL — ABNORMAL LOW (ref 3.5–5.0)
Alkaline Phosphatase: 87 U/L (ref 38–126)
Anion gap: 9 (ref 5–15)
BUN: 12 mg/dL (ref 8–23)
CO2: 22 mmol/L (ref 22–32)
Calcium: 8.4 mg/dL — ABNORMAL LOW (ref 8.9–10.3)
Chloride: 111 mmol/L (ref 98–111)
Creatinine, Ser: 0.72 mg/dL (ref 0.44–1.00)
GFR calc Af Amer: >60 mL/min (ref >60)
GFR calc non Af Amer: >60 mL/min (ref >60)
Glucose, Bld: 96 mg/dL (ref 70–99)
Potassium: 3.7 mmol/L (ref 3.5–5.1)
Sodium: 142 mmol/L (ref 135–145)
Total Bilirubin: 0.3 mg/dL (ref 0.3–1.2)
Total Protein: 6.3 g/dL — ABNORMAL LOW (ref 6.5–8.1)

## 2019-09-14 LAB — IRON AND TIBC
Iron: 93 ug/dL (ref 41–142)
Saturation Ratios: 33 % (ref 21–57)
TIBC: 281 ug/dL (ref 236–444)
UIBC: 189 ug/dL (ref 120–384)

## 2019-09-14 LAB — FERRITIN: Ferritin: 49 ng/mL (ref 11–307)

## 2019-09-14 MED ORDER — CYANOCOBALAMIN 1000 MCG/ML IJ SOLN
1000.0000 ug | Freq: Once | INTRAMUSCULAR | Status: AC
  Administered 2019-09-14: 1000 ug via INTRAMUSCULAR

## 2019-09-15 ENCOUNTER — Telehealth: Payer: Self-pay | Admitting: Gastroenterology

## 2019-09-15 ENCOUNTER — Telehealth: Payer: Self-pay | Admitting: Hematology

## 2019-09-15 NOTE — Telephone Encounter (Signed)
Please advise patient that it may take a few days before the test will be scheduled by swallowing speech pathology.  Please follow-up next week to see if the test has been scheduled or not.  Thank you

## 2019-09-15 NOTE — Telephone Encounter (Signed)
Scheduled appt per 5/13 los.  Spoke with pt and she is aware of her next scheduled appt date and time.

## 2019-09-15 NOTE — Telephone Encounter (Signed)
Pt stated that "she has not received a call to schedule at the other place."

## 2019-09-18 LAB — CHROMOGRANIN A: Chromogranin A (ng/mL): 58.6 ng/mL (ref 0.0–101.8)

## 2019-09-19 ENCOUNTER — Telehealth: Payer: Self-pay | Admitting: Emergency Medicine

## 2019-09-19 IMAGING — [facility] DIGITAL DIAGNOSTIC UNILATERAL LEFT MAMMOGRAM WITH TOMO AND CAD
4 series · 4 of 12 positions shown · non-contrast
Comparison: Previous exam(s).

CLINICAL DATA: 77-year-old female presenting for first six-month
follow-up of a probably benign left breast asymmetry. The patient
was initially scheduled for a stereotactic biopsy in July 2017, but
this area could not be definitively seen at the time of biopsy.

EXAM:
DIGITAL DIAGNOSTIC UNILATERAL LEFT MAMMOGRAM WITH CAD AND TOMO

[L CC synth-2D]
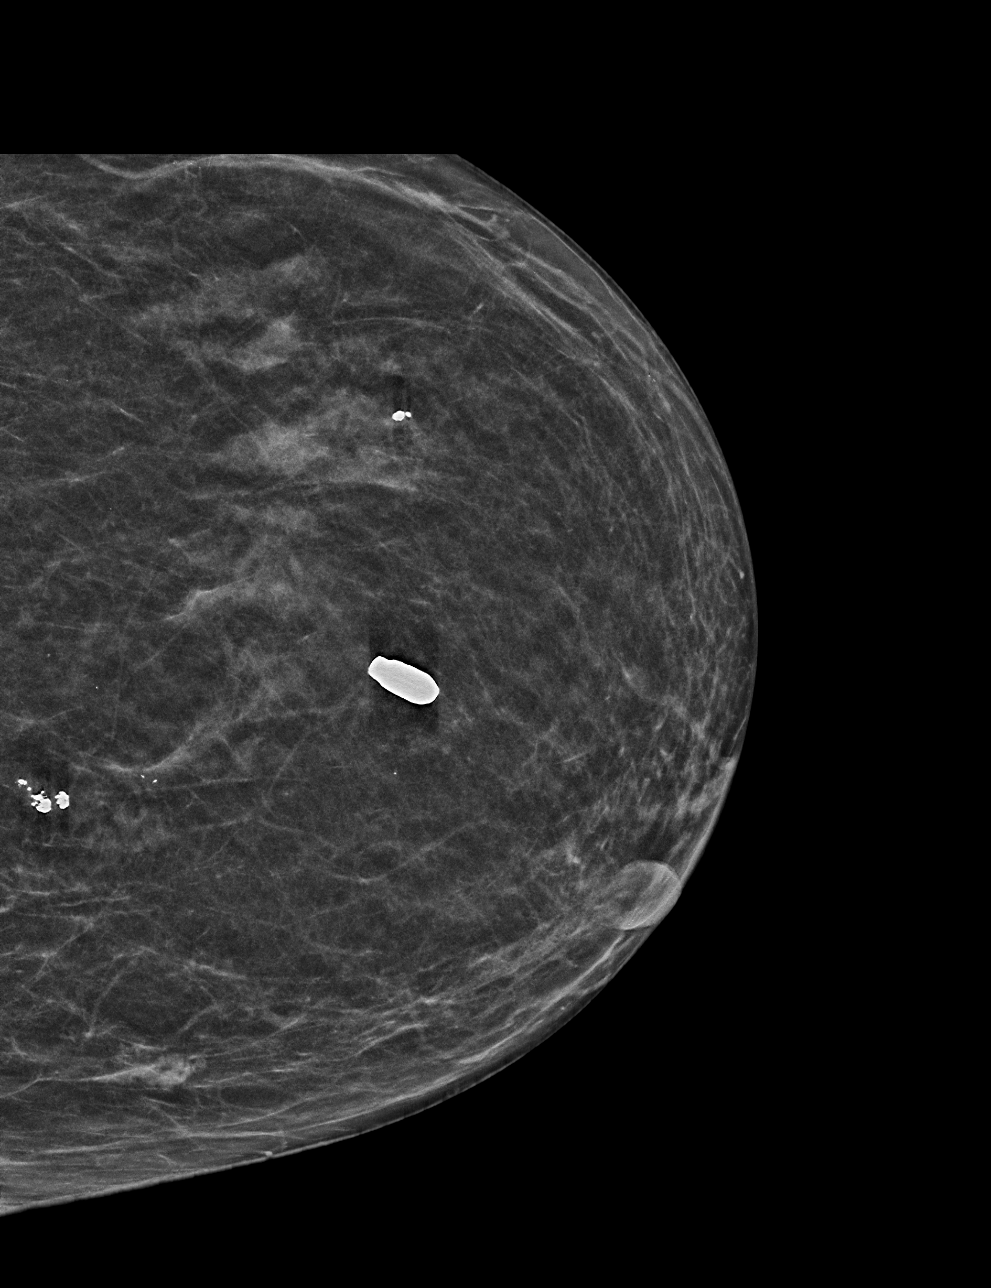

[L MLO synth-2D]
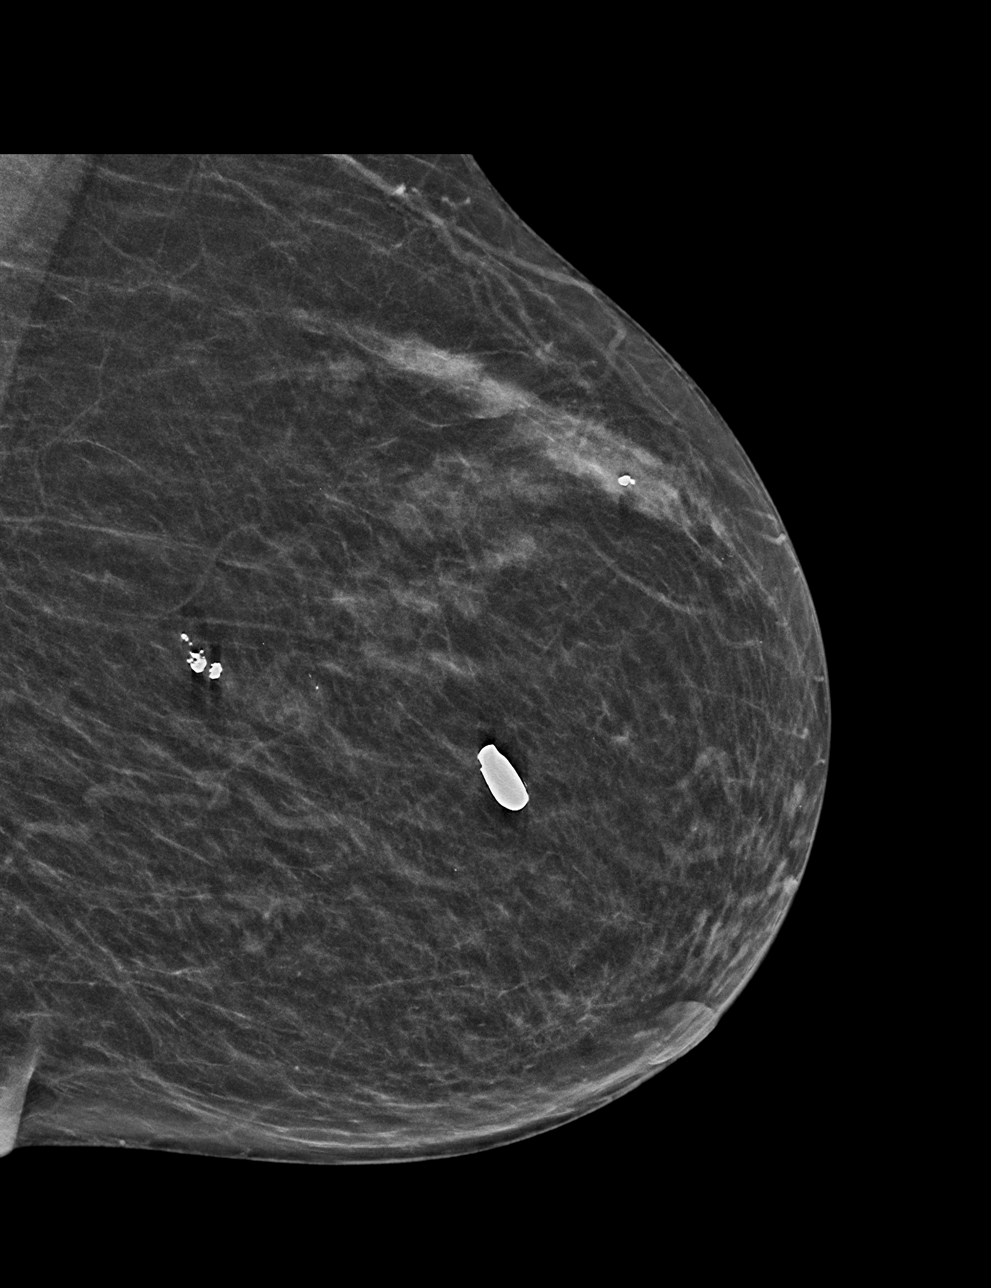

[L MLO tomo · tomo slice 23/46.0]
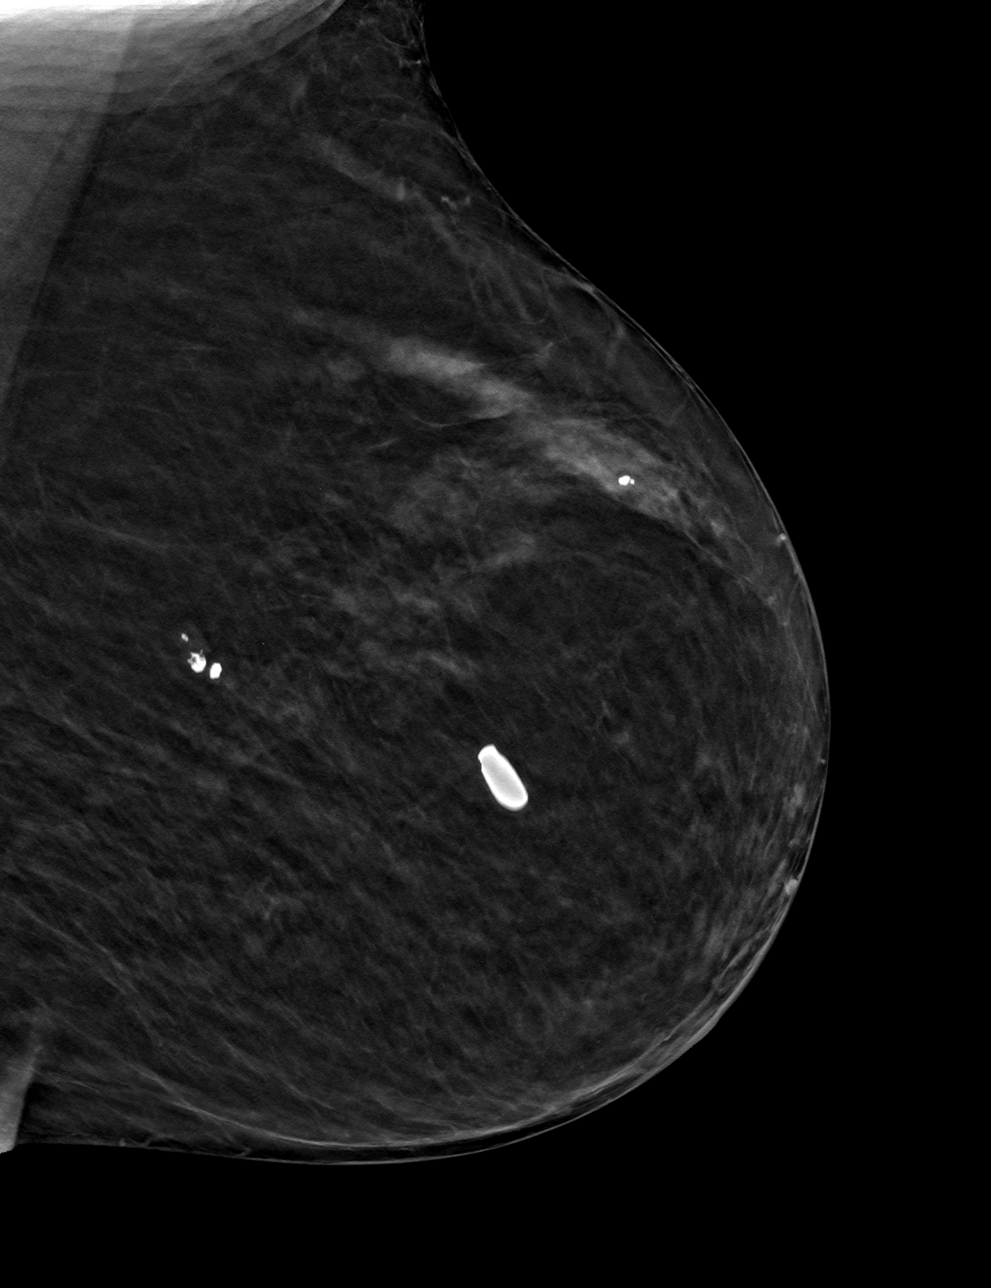

[L CC tomo · tomo slice 21/42.0]
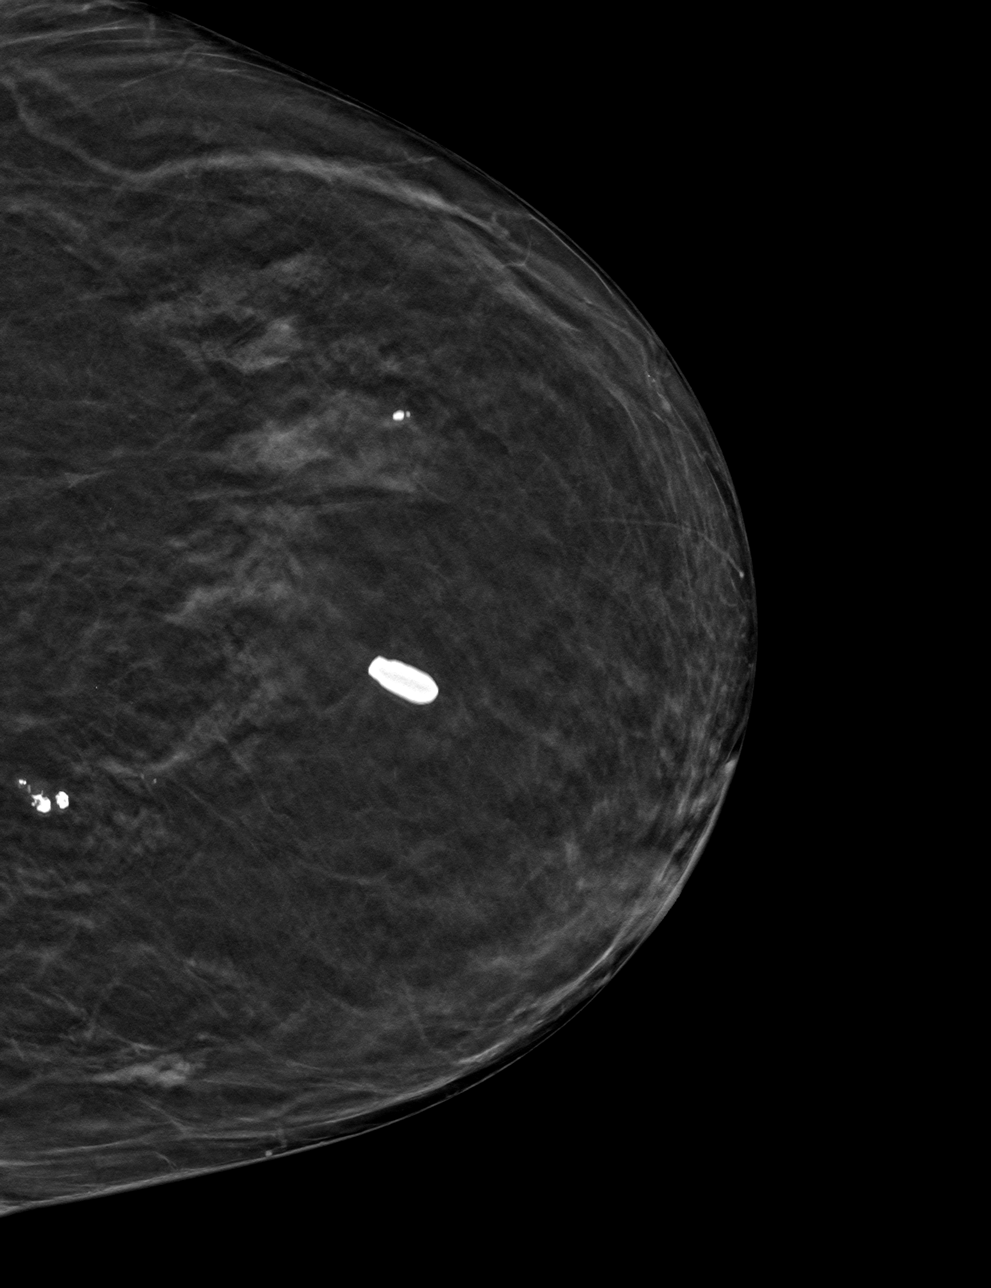

[4 of 12 positions shown; findings below may reference images not displayed]

ACR Breast Density Category b: There are scattered areas of
fibroglandular density.
FINDINGS: Stable appearance of an asymmetry in the lateral left breast at
middle depth on the CC projection. This area is not well seen on the
MLO projection. No new or suspicious findings are identified
throughout the remainder of the left breast.

Mammographic images were processed with CAD.
IMPRESSION: Stable, probably benign left breast asymmetry. Recommendation is for
continued short-term follow-up.

RECOMMENDATION:
Diagnostic bilateral mammogram and possible left breast ultrasound
in July 2018.

I have discussed the findings and recommendations with the patient.
Results were also provided in writing at the conclusion of the
visit. If applicable, a reminder letter will be sent to the patient
regarding the next appointment.

BI-RADS CATEGORY  3: Probably benign.

## 2019-09-19 NOTE — Telephone Encounter (Addendum)
Pt verbalized understanding   ----- Message from Truitt Merle, MD sent at 09/19/2019 10:48 AM EDT ----- Please let pt know her tumor marker was WNL last week, thanks   Truitt Merle

## 2019-09-20 ENCOUNTER — Other Ambulatory Visit: Payer: Self-pay

## 2019-09-20 ENCOUNTER — Ambulatory Visit (HOSPITAL_COMMUNITY)
Admission: RE | Admit: 2019-09-20 | Discharge: 2019-09-20 | Disposition: A | Discharge status: Home / Self Care | Payer: Medicare Other | Source: Ambulatory Visit | Attending: Gastroenterology | Admitting: Gastroenterology

## 2019-09-20 DIAGNOSIS — R1084 Generalized abdominal pain: Secondary | ICD-10-CM | POA: Diagnosis not present

## 2019-09-22 ENCOUNTER — Telehealth (HOSPITAL_COMMUNITY): Payer: Self-pay | Admitting: Speech Pathology

## 2019-09-22 DIAGNOSIS — K219 Gastro-esophageal reflux disease without esophagitis: Secondary | ICD-10-CM

## 2019-09-22 DIAGNOSIS — R1312 Dysphagia, oropharyngeal phase: Secondary | ICD-10-CM

## 2019-09-22 NOTE — Telephone Encounter (Signed)
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  I am worried about additional oropharyngeal dysphagia exacerbating her chronic symptoms.  She had upper GI series recently.  Okay to switch her to a modified barium swallow.   Olivia Mackie, can you please send request for modified barium swallow? Thank you

## 2019-09-25 ENCOUNTER — Other Ambulatory Visit: Payer: Self-pay

## 2019-09-25 MED ORDER — PROMETHAZINE HCL 12.5 MG PO TABS: 12.5000 mg | ORAL_TABLET | Freq: Every day | ORAL | 0 refills | Status: DC | PRN

## 2019-09-25 NOTE — Telephone Encounter (Signed)
Spoke with Suanne Marker at speech pathology [facility] they will contact the patient regarding switching test to Modified barium swallow. New order placed.

## 2019-09-25 NOTE — Telephone Encounter (Signed)
Ok, thanks.

## 2019-09-28 ENCOUNTER — Other Ambulatory Visit: Payer: Self-pay

## 2019-09-28 ENCOUNTER — Inpatient Hospital Stay: Payer: Medicare Other

## 2019-09-28 ENCOUNTER — Other Ambulatory Visit (HOSPITAL_COMMUNITY): Payer: Self-pay | Admitting: *Deleted

## 2019-09-28 ENCOUNTER — Other Ambulatory Visit: Payer: Self-pay | Admitting: Gastroenterology

## 2019-09-28 DIAGNOSIS — R1310 Dysphagia, unspecified: Secondary | ICD-10-CM

## 2019-09-28 DIAGNOSIS — E538 Deficiency of other specified B group vitamins: Secondary | ICD-10-CM | POA: Diagnosis not present

## 2019-09-28 DIAGNOSIS — D519 Vitamin B12 deficiency anemia, unspecified: Secondary | ICD-10-CM

## 2019-09-28 DIAGNOSIS — C7A8 Other malignant neuroendocrine tumors: Secondary | ICD-10-CM

## 2019-09-28 MED ORDER — CYANOCOBALAMIN 1000 MCG/ML IJ SOLN
INTRAMUSCULAR | Status: AC
  Filled 2019-09-28: qty 1

## 2019-09-28 MED ORDER — ESOMEPRAZOLE MAGNESIUM 40 MG PO CPDR: 40.0000 mg | DELAYED_RELEASE_CAPSULE | Freq: Two times a day (BID) | ORAL | 1 refills | Status: DC

## 2019-09-28 MED ORDER — CYANOCOBALAMIN 1000 MCG/ML IJ SOLN
1000.0000 ug | Freq: Once | INTRAMUSCULAR | Status: AC
  Administered 2019-09-28: 1000 ug via INTRAMUSCULAR

## 2019-09-28 NOTE — Patient Instructions (Signed)

## 2019-09-28 NOTE — Telephone Encounter (Signed)
Esomeprazole is refilled as pharmacy requested.

## 2019-10-03 ENCOUNTER — Ambulatory Visit: Payer: Medicare Other | Admitting: Podiatry

## 2019-10-04 LAB — 5 HIAA, QUANTITATIVE, URINE, 24 HOUR
5-HIAA, Ur: 4.3 mg/L
5-HIAA,Quant.,24 Hr Urine: 1.9 mg/24 hr (ref 0.0–14.9)
Total Volume: 450

## 2019-10-06 ENCOUNTER — Telehealth: Payer: Self-pay | Admitting: *Deleted

## 2019-10-06 MED ORDER — AMMONIUM LACTATE 12 % EX CREA: TOPICAL_CREAM | CUTANEOUS | 0 refills | Status: DC | PRN

## 2019-10-10 ENCOUNTER — Telehealth: Payer: Self-pay

## 2019-10-10 NOTE — Telephone Encounter (Signed)
-----   Message from Truitt Merle, MD sent at 10/10/2019  8:36 AM EDT ----- Please let pt know her recent urine test was normal, thanks   Truitt Merle

## 2019-10-10 NOTE — Telephone Encounter (Signed)
I spoke with Emily [name] and let her know her urine test was negative.  She verbalized understanding.

## 2019-10-12 ENCOUNTER — Ambulatory Visit: Payer: Medicare Other

## 2019-10-12 ENCOUNTER — Encounter: Payer: Self-pay | Admitting: Neurology

## 2019-10-12 ENCOUNTER — Telehealth: Payer: Self-pay | Admitting: Neurology

## 2019-10-12 ENCOUNTER — Ambulatory Visit (INDEPENDENT_AMBULATORY_CARE_PROVIDER_SITE_OTHER): Payer: Medicare Other | Admitting: Neurology

## 2019-10-12 VITALS — BP 142/80 | HR 85 | Ht 63.0 in | Wt 113.0 lb

## 2019-10-12 DIAGNOSIS — R42 Dizziness and giddiness: Secondary | ICD-10-CM | POA: Diagnosis not present

## 2019-10-12 DIAGNOSIS — G252 Other specified forms of tremor: Secondary | ICD-10-CM | POA: Diagnosis not present

## 2019-10-12 DIAGNOSIS — I951 Orthostatic hypotension: Secondary | ICD-10-CM

## 2019-10-12 DIAGNOSIS — I679 Cerebrovascular disease, unspecified: Secondary | ICD-10-CM

## 2019-10-12 NOTE — Telephone Encounter (Signed)
[employer] medicare/medicaid order sent to GI. No auth they will reach out to the patient to schedule.  °
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
## 2019-10-13 ENCOUNTER — Other Ambulatory Visit: Payer: Self-pay

## 2019-10-13 ENCOUNTER — Inpatient Hospital Stay: Payer: Medicare Other | Attending: Hematology

## 2019-10-13 DIAGNOSIS — D519 Vitamin B12 deficiency anemia, unspecified: Secondary | ICD-10-CM

## 2019-10-13 DIAGNOSIS — E538 Deficiency of other specified B group vitamins: Secondary | ICD-10-CM | POA: Diagnosis not present

## 2019-10-13 IMAGING — CT CT HEAD W/O CM
3 series · 15 of 47 positions shown, 18 images · non-contrast
Comparison: Brain MR dated 10/30/2017 and head CT dated 06/19/2017.

CLINICAL DATA: Bilateral leg weakness and syncope today.

EXAM:
CT HEAD WITHOUT CONTRAST
TECHNIQUE: Contiguous axial images were obtained from the base of the skull
through the vertex without intravenous contrast.

[Series 3: head 5.0 h30s · axial · 0.42mm/px · z∈[-74,+66]mm · 9 of 34 slices shown, 12 images]
[im 3/34  brain]
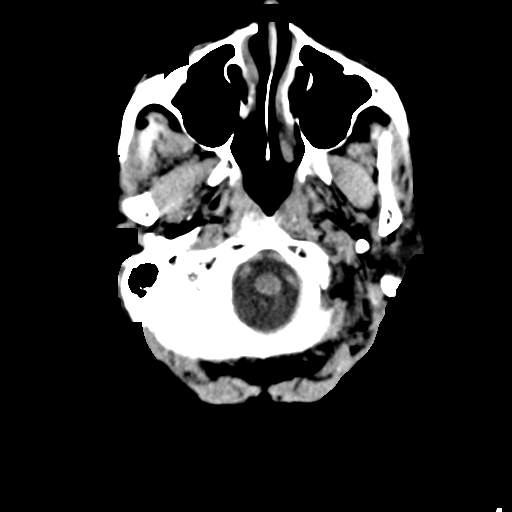
[im 3/34  bone]
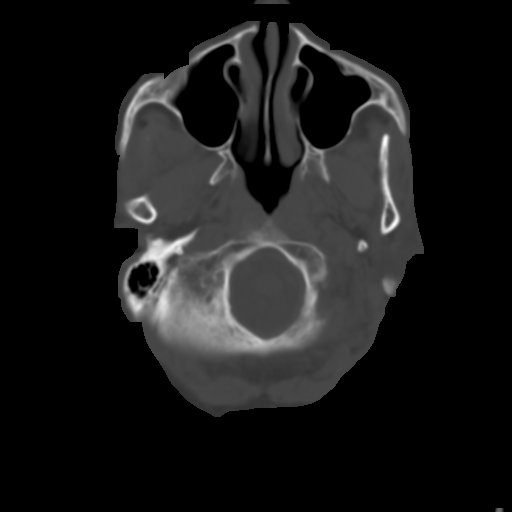
[im 6/34  brain]
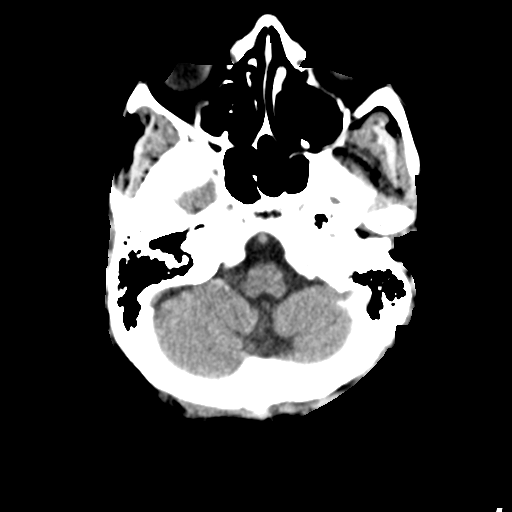
[im 10/34  brain]
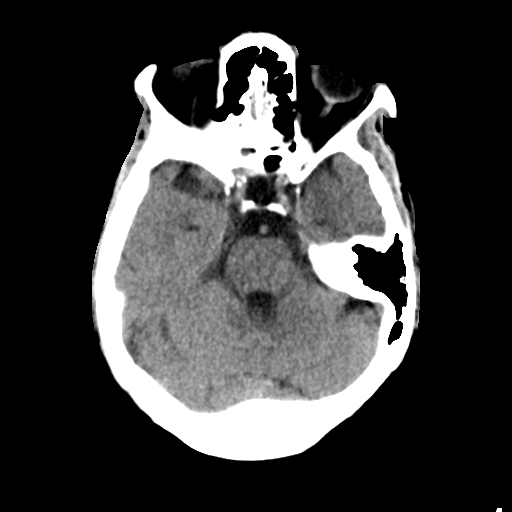
[im 13/34  brain]
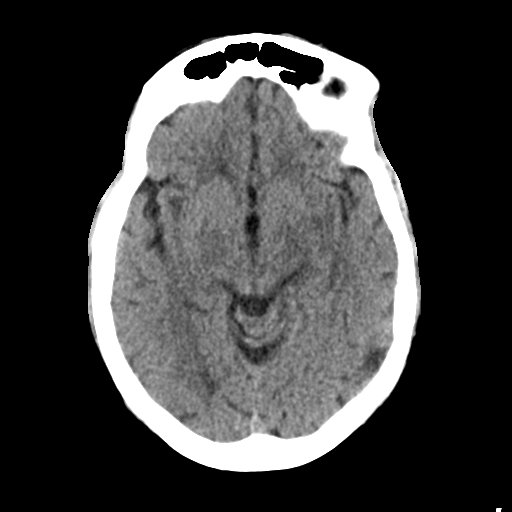
[im 18/34  brain]
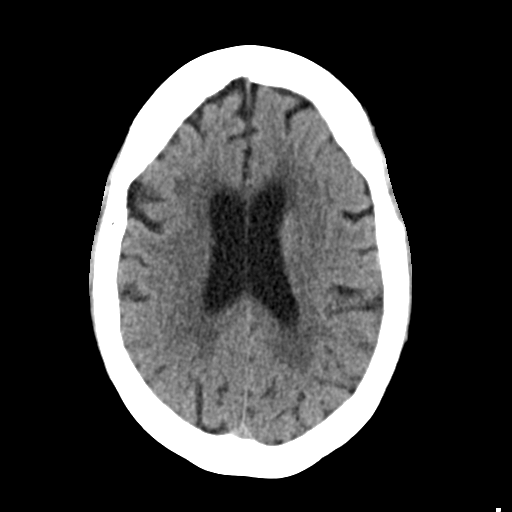
[im 18/34  bone]
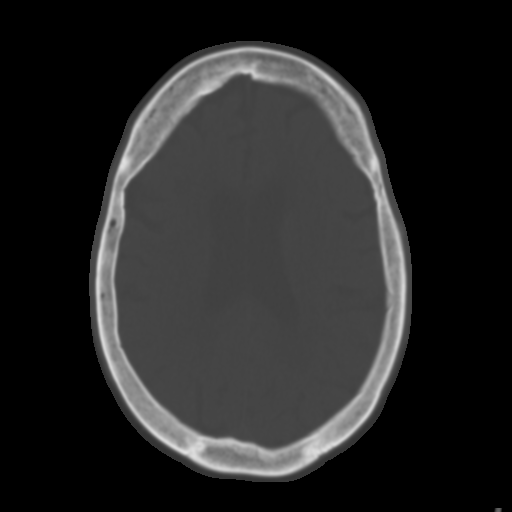
[im 21/34  brain]
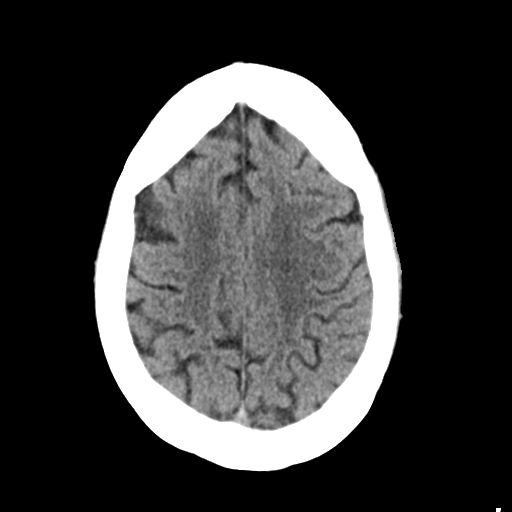
[im 24/34  brain]
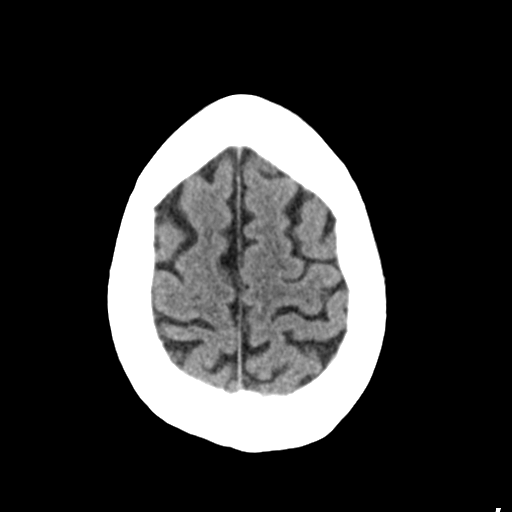
[im 28/34  brain]
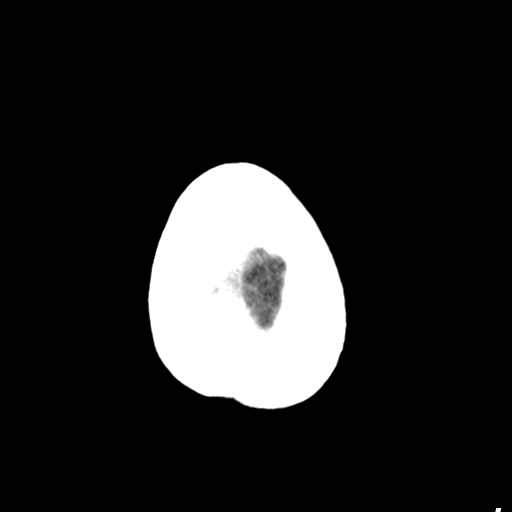
[im 31/34  brain]
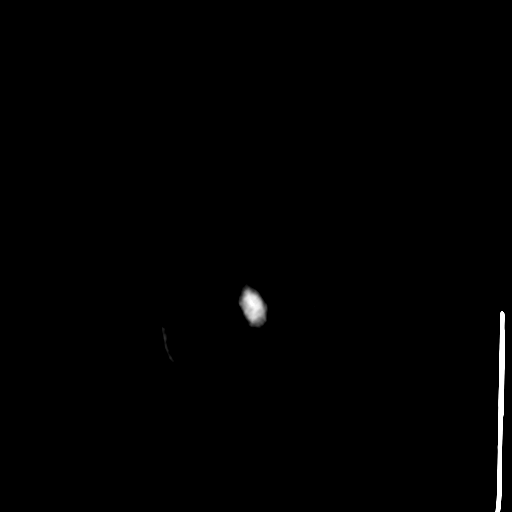
[im 31/34  bone]
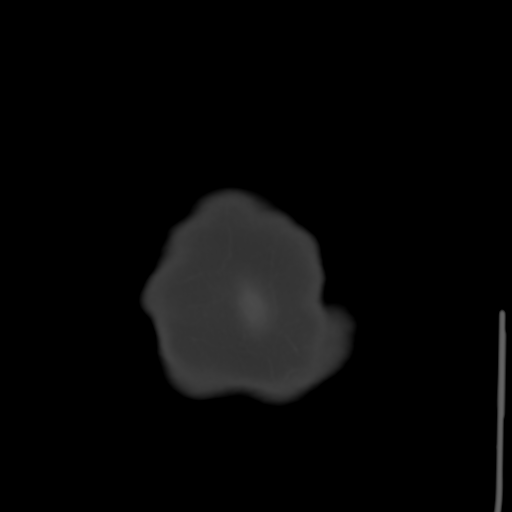

[Series 5: head 3.0 mpr cor · coronal · 0.31mm/px · 3 of 67 slices shown]
[im 23/67  brain]
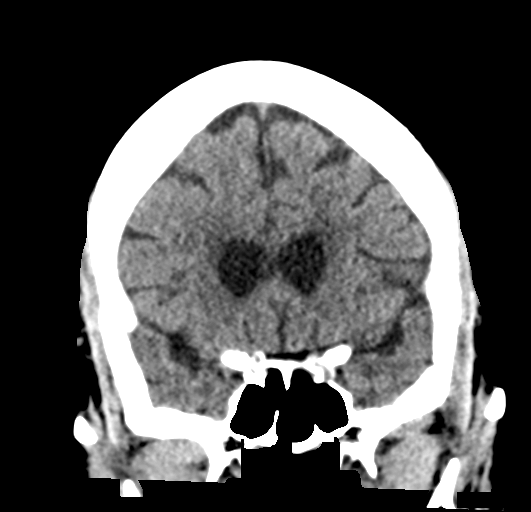
[im 30/67  brain]
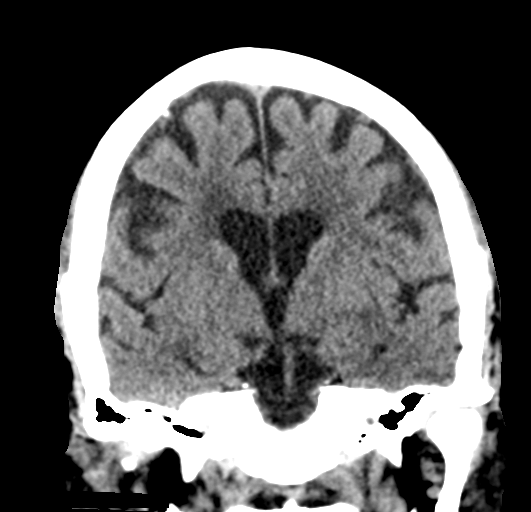
[im 37/67  brain]
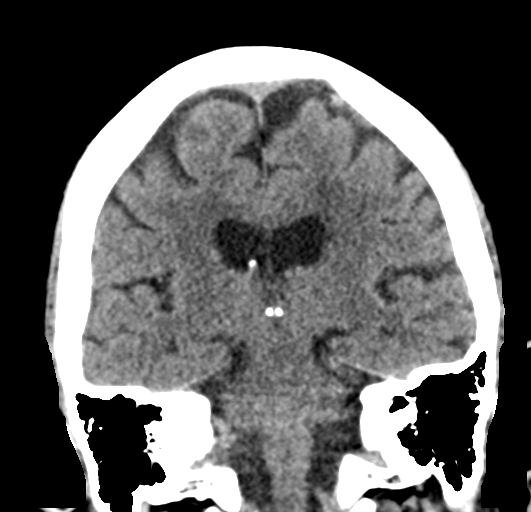

[Series 6: head 3.0 mpr sag · sagittal · 0.30mm/px · 3 of 55 slices shown]
[im 19/55  brain]
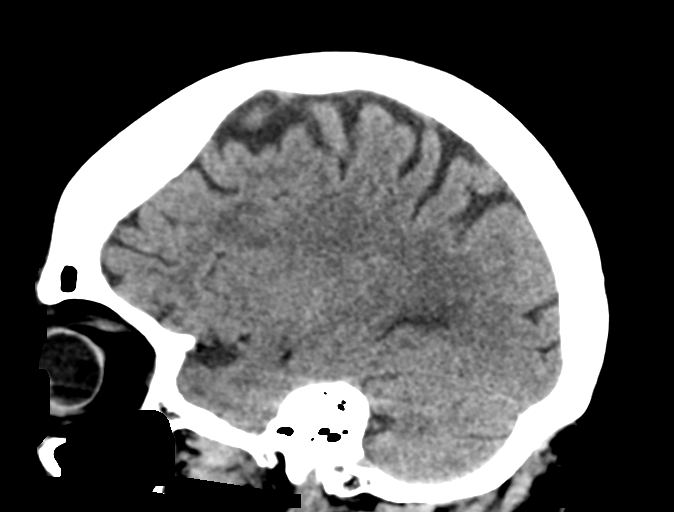
[im 28/55  brain]
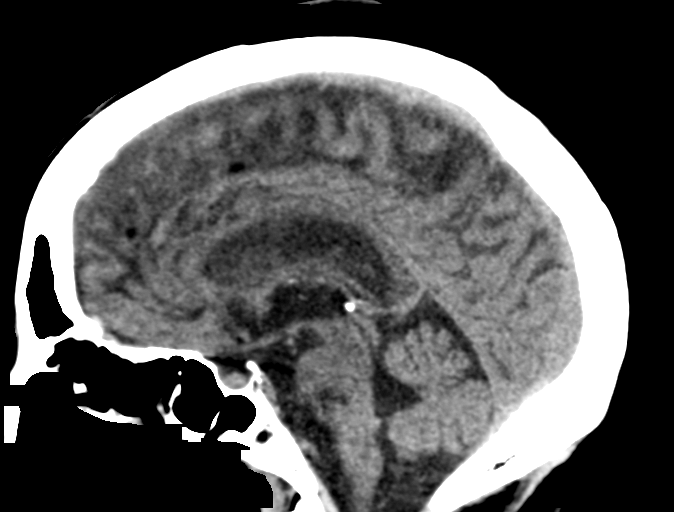
[im 37/55  brain]
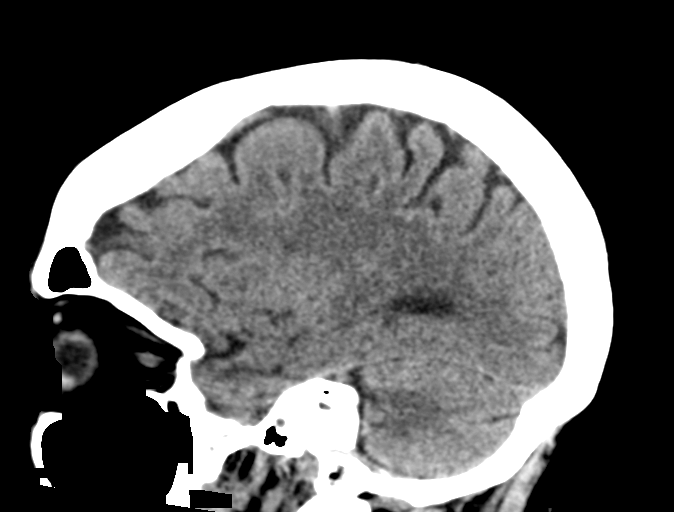

[15 of 47 positions shown; findings below may reference images not displayed]

FINDINGS: Brain: Mildly enlarged ventricles and cortical sulci. Mild patchy
white matter low density in both cerebral hemispheres. No
intracranial hemorrhage, mass lesion or CT evidence of acute
infarction.

Vascular: No hyperdense vessel or unexpected calcification.

Skull: Stable circumscribed lucency in the clivus with a thin
surrounding sclerotic rim. Otherwise, appearing skull.

Sinuses/Orbits: Status post cataract extraction on the left.
Unremarkable paranasal sinuses.

Other: None.
IMPRESSION: 1. No acute abnormality.
2. Mild diffuse cerebral and cerebellar atrophy.
3. Mild chronic small vessel white matter ischemic changes in both
cerebral hemispheres.

## 2019-10-13 MED ORDER — CYANOCOBALAMIN 1000 MCG/ML IJ SOLN
1000.0000 ug | Freq: Once | INTRAMUSCULAR | Status: AC
  Administered 2019-10-13: 1000 ug via INTRAMUSCULAR

## 2019-10-13 MED ORDER — CYANOCOBALAMIN 1000 MCG/ML IJ SOLN
INTRAMUSCULAR | Status: AC
  Filled 2019-10-13: qty 1

## 2019-10-13 NOTE — Patient Instructions (Signed)
Cyanocobalamin, Pyridoxine, and Folate What is this medicine? A multivitamin containing folic acid, vitamin B6, and vitamin B12. This medicine may be used for other purposes; ask your health care provider or pharmacist if you have questions. COMMON BRAND NAME(S): AllanFol RX, AllanTex, Av-Vite FB, B Complex with Folic Acid, ComBgen, FaBB, Folamin, Folastin, Folbalin, Folbee, Folbic, Folcaps, Folgard, Folgard RX, Folgard RX 2.2, Folplex, Folplex 2.2, Foltabs 800, Foltx, Homocysteine Formula, Niva-Fol, NuFol, TL Gard RX, Virt-Gard, Virt-Vite, Virt-Vite Forte, Vita-Respa What should I tell my health care provider before I take this medicine? They need to know if you have any of these conditions:  bleeding or clotting disorder  history of anemia of any type  other chronic health condition  an unusual or allergic reaction to vitamins, other medicines, foods, dyes, or preservatives  pregnant or trying to get pregnant  breast-feeding How should I use this medicine? Take by mouth with a glass of water. May take with food. Follow the directions on the prescription label. It is usually given once a day. Do not take your medicine more often than directed. Contact your pediatrician regarding the use of this medicine in children. Special care may be needed. Overdosage: If you think you have taken too much of this medicine contact a poison control center or emergency room at once. NOTE: This medicine is only for you. Do not share this medicine with others. What if I miss a dose? If you miss a dose, take it as soon as you can. If it is almost time for your next dose, take only that dose. Do not take double or extra doses. What may interact with this medicine?  levodopa This list may not describe all possible interactions. Give your health care provider a list of all the medicines, herbs, non-prescription drugs, or dietary supplements you use. Also tell them if you smoke, drink alcohol, or use illegal  drugs. Some items may interact with your medicine. What should I watch for while using this medicine? See your health care professional for regular checks on your progress. Remember that vitamin supplements do not replace the need for good nutrition from a balanced diet. What side effects may I notice from receiving this medicine? Side effects that you should report to your doctor or health care professional as soon as possible:  allergic reaction such as skin rash or difficulty breathing  vomiting Side effects that usually do not require medical attention (report to your doctor or health care professional if they continue or are bothersome):  nausea  stomach upset This list may not describe all possible side effects. Call your doctor for medical advice about side effects. You may report side effects to FDA at 1-800-FDA-1088. Where should I keep my medicine? Keep out of the reach of children. Most vitamins should be stored at controlled room temperature. Check your specific product directions. Protect from heat and moisture. Throw away any unused medicine after the expiration date. NOTE: This sheet is a summary. It may not cover all possible information. If you have questions about this medicine, talk to your doctor, pharmacist, or health care provider.  2020 Elsevier/Gold Standard (2007-06-11 00:59:55)  

## 2019-10-19 ENCOUNTER — Other Ambulatory Visit: Payer: Self-pay

## 2019-10-19 ENCOUNTER — Ambulatory Visit (HOSPITAL_COMMUNITY)
Admission: RE | Admit: 2019-10-19 | Discharge: 2019-10-19 | Disposition: A | Discharge status: Home / Self Care | Payer: Medicare Other | Source: Ambulatory Visit | Attending: Gastroenterology | Admitting: Gastroenterology

## 2019-10-19 ENCOUNTER — Ambulatory Visit (HOSPITAL_COMMUNITY): Payer: Medicare Other

## 2019-10-19 ENCOUNTER — Encounter (HOSPITAL_COMMUNITY): Payer: Self-pay

## 2019-10-19 DIAGNOSIS — K219 Gastro-esophageal reflux disease without esophagitis: Secondary | ICD-10-CM

## 2019-10-19 DIAGNOSIS — R1312 Dysphagia, oropharyngeal phase: Secondary | ICD-10-CM | POA: Insufficient documentation

## 2019-10-19 DIAGNOSIS — R1310 Dysphagia, unspecified: Secondary | ICD-10-CM | POA: Diagnosis not present

## 2019-10-19 NOTE — Progress Notes (Signed)
Modified Barium Swallow Progress Note  Patient Details  Name: Emily [name] MRN: [id number] Date of Birth: 05/02/1941  Today's Date: 10/19/2019  Modified Barium Swallow completed.  Full report located under Chart Review in the Imaging Section.  Brief recommendations include the following:  Clinical Impression  Pt demonstrates slow but functional oral preparatory phase of swallow. There are anatomical changes to the pharynx related to age and curvature of spine, though airway protection and pharyngeal function was very good. The pt spilled her barium at one point and was startled, resulting in one instance of trace frank penetration. Cervical esophageal phase did show appearance of prominent cricopharyngeus, but there was no impact on swallow function and no residue. Esophageal sweep did show abnomalities consistent with GI reports of dysmotility etc. Reiterated basic precautions. No SLP f/u or diet modification needed.    Swallow Evaluation Recommendations       SLP Diet Recommendations: Regular solids;Thin liquid   Liquid Administration via: Cup;Straw   Medication Administration: Whole meds with liquid   Supervision: Patient able to self feed   Compensations: Slow rate;Small sips/bites   Postural Changes: Remain semi-upright after after feeds/meals (Comment)   Oral Care Recommendations: Oral care BID;Patient independent with oral care        Emily [name], Emily [name] 10/19/2019,12:54 PM

## 2019-10-23 ENCOUNTER — Encounter (HOSPITAL_COMMUNITY): Payer: Medicare Other

## 2019-10-25 ENCOUNTER — Other Ambulatory Visit: Payer: Self-pay

## 2019-10-25 ENCOUNTER — Ambulatory Visit (INDEPENDENT_AMBULATORY_CARE_PROVIDER_SITE_OTHER): Payer: Medicare Other | Admitting: Podiatry

## 2019-10-25 ENCOUNTER — Encounter: Payer: Self-pay | Admitting: Podiatry

## 2019-10-25 DIAGNOSIS — M79675 Pain in left toe(s): Secondary | ICD-10-CM

## 2019-10-25 DIAGNOSIS — E0842 Diabetes mellitus due to underlying condition with diabetic polyneuropathy: Secondary | ICD-10-CM

## 2019-10-25 DIAGNOSIS — M79674 Pain in right toe(s): Secondary | ICD-10-CM

## 2019-10-25 DIAGNOSIS — B351 Tinea unguium: Secondary | ICD-10-CM

## 2019-10-25 NOTE — Progress Notes (Signed)
This patient returns to my office for at risk foot care.  This patient requires this care by a professional since this patient will be at risk due to having  Diabetes.  This patient is unable to cut nails himself since the patient cannot reach his nails.These nails are painful walking and wearing shoes.  This patient presents for at risk foot care today.  General Appearance  Alert, conversant and in no acute stress.  Vascular  Dorsalis pedis and posterior tibial  pulses are palpable  bilaterally.  Capillary return is within normal limits  bilaterally. Temperature is within normal limits  bilaterally.  Neurologic  Senn-Weinstein monofilament wire test absent   bilaterally. Muscle power within normal limits bilaterally.  Nails Thick disfigured discolored nails with subungual debris  from hallux to fifth toes bilaterally. No evidence of bacterial infection or drainage bilaterally.  Orthopedic  No limitations of motion  feet .  No crepitus or effusions noted.  No bony pathology or digital deformities noted.  Skin  normotropic skin with no porokeratosis noted bilaterally.  No signs of infections or ulcers noted.     Onychomycosis  Pain in right toes  Pain in left toes  Consent was obtained for treatment procedures.   Mechanical debridement of nails 1-5  bilaterally performed with a nail nipper.  Filed with dremel without incident.    Return office visit   3 months                   Told patient to return for periodic foot care and evaluation due to potential at risk complications.   Gardiner Barefoot DPM

## 2019-10-26 ENCOUNTER — Inpatient Hospital Stay: Payer: Medicare Other

## 2019-10-26 ENCOUNTER — Other Ambulatory Visit: Payer: Self-pay

## 2019-10-26 VITALS — BP 136/70 | HR 89 | Temp 98.1°F | Resp 20

## 2019-10-26 DIAGNOSIS — D519 Vitamin B12 deficiency anemia, unspecified: Secondary | ICD-10-CM

## 2019-10-26 DIAGNOSIS — E538 Deficiency of other specified B group vitamins: Secondary | ICD-10-CM | POA: Diagnosis not present

## 2019-10-26 MED ORDER — DENOSUMAB 120 MG/1.7ML ~~LOC~~ SOLN
SUBCUTANEOUS | Status: AC
  Filled 2019-10-26: qty 1.7

## 2019-10-26 MED ORDER — CYANOCOBALAMIN 1000 MCG/ML IJ SOLN
1000.0000 ug | Freq: Once | INTRAMUSCULAR | Status: AC
  Administered 2019-10-26: 1000 ug via INTRAMUSCULAR

## 2019-10-26 MED ORDER — CYANOCOBALAMIN 1000 MCG/ML IJ SOLN
INTRAMUSCULAR | Status: AC
  Filled 2019-10-26: qty 1

## 2019-10-26 NOTE — Patient Instructions (Signed)

## 2019-10-30 ENCOUNTER — Ambulatory Visit: Admission: RE | Admit: 2019-10-30 | Payer: Medicare Other | Source: Ambulatory Visit

## 2019-10-30 ENCOUNTER — Other Ambulatory Visit: Payer: Self-pay | Admitting: Neurology

## 2019-10-30 ENCOUNTER — Telehealth: Payer: Self-pay | Admitting: Neurology

## 2019-10-30 ENCOUNTER — Other Ambulatory Visit: Payer: Self-pay

## 2019-10-30 DIAGNOSIS — I951 Orthostatic hypotension: Secondary | ICD-10-CM

## 2019-10-30 DIAGNOSIS — G252 Other specified forms of tremor: Secondary | ICD-10-CM

## 2019-10-30 DIAGNOSIS — Z87898 Personal history of other specified conditions: Secondary | ICD-10-CM

## 2019-10-30 DIAGNOSIS — R42 Dizziness and giddiness: Secondary | ICD-10-CM

## 2019-10-30 NOTE — Telephone Encounter (Signed)
Dr. Jannifer Franklin ordered CTA of the head and neck which requires contrast. Unfortunately MRA of the neck also requires contrast even if the MRA head does not, so we still need IV access for MRI as well. I would tell the patient to hydrate very well prior to exam and if they need ultrasound to find her veins do we need to order something like that? I would first try to really hydrate well the day and night before her CT.

## 2019-10-30 NOTE — Telephone Encounter (Signed)
Patient is at [facility] right now and they are not able to do the contrast on the patient her veins keeping blowing. They are going to have to reschedule the patient for a later day. But they would like to know if the CTA"s need to be done with the ultrasound guided IV because of her veins blowing or if she needs to have a MRA Head wo contrast.

## 2019-10-31 ENCOUNTER — Telehealth: Payer: Self-pay | Admitting: Physical Medicine and Rehabilitation

## 2019-10-31 NOTE — Telephone Encounter (Signed)
Noted, patient is rescheduled for 11/07/19 at [facility]. They are going to do the IV access before.

## 2019-10-31 NOTE — Telephone Encounter (Signed)
Patient called.   Requesting an appointment to be seen for her back  Call back: [phone]

## 2019-10-31 NOTE — Telephone Encounter (Signed)
[address]483, Injection(s), anesthetic agent and/or more  Notification/Prior Authorization not required if procedure performed in Office; otherwise may be required for this service.

## 2019-10-31 NOTE — Telephone Encounter (Signed)
ok 

## 2019-10-31 NOTE — Telephone Encounter (Signed)
Bilateral L5 TF 08/28/19. Ok to repeat if helped, same problem/side, and no new injury?

## 2019-11-01 NOTE — Telephone Encounter (Signed)
Scheduled for 7/20 at 1415 with driver.

## 2019-11-02 ENCOUNTER — Telehealth: Payer: Self-pay | Admitting: Gastroenterology

## 2019-11-02 NOTE — Telephone Encounter (Signed)
Patient is calling states she is having a lot of abdominal pain and feels nauseated also states she woke up and her head felt like it was spinning.

## 2019-11-02 NOTE — Telephone Encounter (Signed)
Patient states she has felt "sick the last 3 days." Reports pain at the bottom of stomach that radiates to her back. She did have a bowel movement and felt a little better but still has pain. She had nausea this AM and took a Promethazine which helped. She has also taken gabapentin, Tramadol which has made her sleepy but still has pain. Also reports she was dizzy and had leg tremors yesterday. She reports she has her Lidoderm patch on. She has an appointment to see Dr. Ernestina Patches her primary on 11/21/19.

## 2019-11-07 ENCOUNTER — Ambulatory Visit
Admission: RE | Admit: 2019-11-07 | Discharge: 2019-11-07 | Disposition: A | Discharge status: Home / Self Care | Payer: Medicare Other | Source: Ambulatory Visit | Attending: Neurology | Admitting: Neurology

## 2019-11-07 ENCOUNTER — Telehealth: Payer: Self-pay | Admitting: Neurology

## 2019-11-07 DIAGNOSIS — I951 Orthostatic hypotension: Secondary | ICD-10-CM

## 2019-11-07 DIAGNOSIS — R42 Dizziness and giddiness: Secondary | ICD-10-CM

## 2019-11-07 DIAGNOSIS — E041 Nontoxic single thyroid nodule: Secondary | ICD-10-CM

## 2019-11-07 DIAGNOSIS — G252 Other specified forms of tremor: Secondary | ICD-10-CM

## 2019-11-07 DIAGNOSIS — Z87898 Personal history of other specified conditions: Secondary | ICD-10-CM

## 2019-11-07 HISTORY — PX: IR US GUIDE VASC ACCESS LEFT: IMG2389

## 2019-11-07 MED ORDER — IOPAMIDOL (ISOVUE-370) INJECTION 76%
75.0000 mL | Freq: Once | INTRAVENOUS | Status: AC | PRN
  Administered 2019-11-07: 75 mL via INTRAVENOUS

## 2019-11-07 NOTE — Telephone Encounter (Signed)
I called and left a message, I will call back later with the results of the CT angiogram.  No etiology of her episodes of dizziness and syncope noted, may need to get a thyroid ultrasound.   CTA head and neck 11/07/19:  IMPRESSION: 1. No acute intracranial abnormality. 2. No large vessel occlusion, hemodynamically significant stenosis, or evidence of dissection in the head or neck. 3. Enhancing 1.3 cm nodule posterior to the right thyroid lobe may represent exophytic thyroid nodule versus parathyroid lesion. Recommend nonemergent thyroid ultrasound for further evaluation. 4. Aortic atherosclerosis.

## 2019-11-07 NOTE — Telephone Encounter (Signed)
I called the patient.  The CT angiogram was unremarkable, the saw a possible thyroid nodule on the right, she is unaware of any of this, I will get an ultrasound.  The etiology of her dizziness and leg tremors are not clear.

## 2019-11-09 ENCOUNTER — Telehealth: Payer: Self-pay | Admitting: Physical Medicine and Rehabilitation

## 2019-11-09 ENCOUNTER — Inpatient Hospital Stay: Payer: Medicare Other

## 2019-11-09 NOTE — Telephone Encounter (Signed)
Patient called requesting a earlier appt. Please call pt at [phone].

## 2019-11-10 ENCOUNTER — Inpatient Hospital Stay: Payer: Medicare Other | Attending: Hematology

## 2019-11-10 ENCOUNTER — Other Ambulatory Visit: Payer: Self-pay

## 2019-11-10 VITALS — BP 120/61 | HR 91 | Temp 98.4°F | Resp 18

## 2019-11-10 DIAGNOSIS — E538 Deficiency of other specified B group vitamins: Secondary | ICD-10-CM | POA: Diagnosis not present

## 2019-11-10 DIAGNOSIS — D519 Vitamin B12 deficiency anemia, unspecified: Secondary | ICD-10-CM

## 2019-11-10 MED ORDER — CYANOCOBALAMIN 1000 MCG/ML IJ SOLN
1000.0000 ug | Freq: Once | INTRAMUSCULAR | Status: AC
  Administered 2019-11-10: 1000 ug via INTRAMUSCULAR

## 2019-11-10 NOTE — Telephone Encounter (Signed)
Called pt and advised that we do not have any earlier appointments.

## 2019-11-10 NOTE — Telephone Encounter (Signed)
Patient is returning your call.  

## 2019-11-10 NOTE — Telephone Encounter (Signed)
Tried to return phone call but pt is not home at this time.  Spoke with pt and let her know that Dr. Silverio Decamp is out of the office. Discussed with her she should continue using her lidoderm patches and keep her PCP appt that is scheduled. Please advise of any further recommendations.

## 2019-11-13 NOTE — Telephone Encounter (Signed)
Spoke with pt and she is aware of Dr. Nandigam's recommendations. ?

## 2019-11-13 NOTE — Telephone Encounter (Signed)
  She should discuss dizziness and tremors with her PMD.  Chronic GI symptoms with intermittent flareups, unfortunately we do not have any easy fix because of postsurgical anatomy and complications related to it.  Please schedule office visit, next available appointment.  Thank you

## 2019-11-15 ENCOUNTER — Ambulatory Visit: Payer: Self-pay

## 2019-11-15 ENCOUNTER — Encounter: Payer: Self-pay | Admitting: Physical Medicine and Rehabilitation

## 2019-11-15 ENCOUNTER — Other Ambulatory Visit: Payer: Self-pay

## 2019-11-15 ENCOUNTER — Ambulatory Visit (INDEPENDENT_AMBULATORY_CARE_PROVIDER_SITE_OTHER): Payer: Medicare Other | Admitting: Physical Medicine and Rehabilitation

## 2019-11-15 VITALS — BP 117/66 | HR 80

## 2019-11-15 DIAGNOSIS — M48061 Spinal stenosis, lumbar region without neurogenic claudication: Secondary | ICD-10-CM

## 2019-11-15 DIAGNOSIS — M5116 Intervertebral disc disorders with radiculopathy, lumbar region: Secondary | ICD-10-CM | POA: Diagnosis not present

## 2019-11-15 MED ORDER — METHYLPREDNISOLONE ACETATE 80 MG/ML IJ SUSP
80.0000 mg | Freq: Once | INTRAMUSCULAR | Status: AC
  Administered 2019-11-15: 80 mg

## 2019-11-15 NOTE — Progress Notes (Signed)
Pt states right and left hip bone has been hurting her. Pt states her lower back pain travel up her back. Pt states when her back hurts then her stomach starts to hurt. Pt states walking makes the pain worse. Pt states laying down helps with the pain. Pt hx of epidural inj on 08/28/19.  Numeric Pain Rating Scale and Functional Assessment Average Pain 10   In the last MONTH (on 0-10 scale) has pain interfered with the following?  1. General activity like being  able to carry out your everyday physical activities such as walking, climbing stairs, carrying groceries, or moving a chair?  Rating(10)   +Driver, -BT, -Dye Allergies.

## 2019-11-20 ENCOUNTER — Ambulatory Visit
Admission: RE | Admit: 2019-11-20 | Discharge: 2019-11-20 | Disposition: A | Discharge status: Home / Self Care | Payer: Medicare Other | Source: Ambulatory Visit | Attending: Neurology | Admitting: Neurology

## 2019-11-20 DIAGNOSIS — E041 Nontoxic single thyroid nodule: Secondary | ICD-10-CM

## 2019-11-21 ENCOUNTER — Encounter: Payer: Medicare Other | Admitting: Physical Medicine and Rehabilitation

## 2019-11-21 ENCOUNTER — Telehealth: Payer: Self-pay | Admitting: Neurology

## 2019-11-21 ENCOUNTER — Telehealth: Payer: Self-pay | Admitting: Physical Medicine and Rehabilitation

## 2019-11-21 DIAGNOSIS — R269 Unspecified abnormalities of gait and mobility: Secondary | ICD-10-CM

## 2019-11-21 NOTE — Telephone Encounter (Signed)
Pt called stating she's still in pain and would like a CB to discuss her options.  [phone]

## 2019-11-21 NOTE — Telephone Encounter (Signed)
Could try facet joint at L5-S1, 2) see Dr. Joaquin Courts for hip eval and fresh look 3)update lspine MRI 4) if not done lately regroup with PT

## 2019-11-21 NOTE — Telephone Encounter (Signed)
I called the patient.  The thyroid ultrasound suggests that the nodule is benign, no indication for follow-up.   Thyroid ultrasound 11/21/19:  IMPRESSION: No thyroid nodule meets criteria for biopsy or surveillance, as designated by the newly established ACR TI-RADS criteria.

## 2019-11-21 NOTE — Telephone Encounter (Signed)
Bilateral L5 TF 7/14. Please advise.

## 2019-11-22 NOTE — Telephone Encounter (Signed)
Is auth needed for [id number]?

## 2019-11-22 NOTE — Telephone Encounter (Signed)
Scheduled for 7/29 at 1100 with driver.

## 2019-11-22 NOTE — Telephone Encounter (Signed)
Auth for [id number] not required for pt insurance.

## 2019-11-23 ENCOUNTER — Inpatient Hospital Stay: Payer: Medicare Other

## 2019-11-23 ENCOUNTER — Other Ambulatory Visit: Payer: Self-pay

## 2019-11-23 VITALS — BP 157/72 | HR 88

## 2019-11-23 DIAGNOSIS — D519 Vitamin B12 deficiency anemia, unspecified: Secondary | ICD-10-CM

## 2019-11-23 DIAGNOSIS — E538 Deficiency of other specified B group vitamins: Secondary | ICD-10-CM | POA: Diagnosis not present

## 2019-11-23 MED ORDER — CYANOCOBALAMIN 1000 MCG/ML IJ SOLN
INTRAMUSCULAR | Status: AC
  Filled 2019-11-23: qty 1

## 2019-11-23 MED ORDER — CYANOCOBALAMIN 1000 MCG/ML IJ SOLN
1000.0000 ug | Freq: Once | INTRAMUSCULAR | Status: AC
  Administered 2019-11-23: 1000 ug via INTRAMUSCULAR

## 2019-11-29 NOTE — Telephone Encounter (Signed)
Pt has called stating that her former PCP informed her that her tremors and her dizzy spells are all in her mind.  Pt states he used a big word in making an attempt to explain the reason as to why he said that.  All test ran (according to Dr) have come back normal and within appropriate range.  Pt is asking if when Dr Jannifer Franklin is back in office he could please call her.

## 2019-11-30 ENCOUNTER — Other Ambulatory Visit: Payer: Self-pay

## 2019-11-30 ENCOUNTER — Encounter: Payer: Self-pay | Admitting: Physical Medicine and Rehabilitation

## 2019-11-30 ENCOUNTER — Ambulatory Visit (INDEPENDENT_AMBULATORY_CARE_PROVIDER_SITE_OTHER): Payer: Medicare Other | Admitting: Physical Medicine and Rehabilitation

## 2019-11-30 ENCOUNTER — Ambulatory Visit: Payer: Self-pay

## 2019-11-30 VITALS — BP 118/64 | HR 78

## 2019-11-30 DIAGNOSIS — M47816 Spondylosis without myelopathy or radiculopathy, lumbar region: Secondary | ICD-10-CM | POA: Diagnosis not present

## 2019-11-30 MED ORDER — METHYLPREDNISOLONE ACETATE 80 MG/ML IJ SUSP
80.0000 mg | Freq: Once | INTRAMUSCULAR | Status: AC
  Administered 2019-11-30: 80 mg

## 2019-11-30 NOTE — Addendum Note (Signed)
Addended by: Kathrynn Ducking on: 11/30/2019 03:46 PM   Modules accepted: Orders

## 2019-11-30 NOTE — Telephone Encounter (Signed)
I called the patient.  The patient is having ongoing problems with dizziness and episodes of orthostatic tremor or balance symptoms of the legs with standing.  The etiology of this has not been determined.  The possibility of a psychogenic problem does need to be considered but confirming this is quite difficult.  I have suggested a second opinion through Dr. Deboraha Sprang at [facility].  I will try to get this set up for her.

## 2019-11-30 NOTE — Progress Notes (Signed)
Pt states lower back pain that travel to the right hip. Pt has hx of inj on 11/15/19 nothing has change.  Numeric Pain Rating Scale and Functional Assessment Average Pain 8   In the last MONTH (on 0-10 scale) has pain interfered with the following?  1. General activity like being  able to carry out your everyday physical activities such as walking, climbing stairs, carrying groceries, or moving a chair?  Rating(10)   +Driver, -BT, -Dye Allergies.

## 2019-12-04 ENCOUNTER — Ambulatory Visit (INDEPENDENT_AMBULATORY_CARE_PROVIDER_SITE_OTHER): Payer: Medicare Other | Admitting: Gastroenterology

## 2019-12-04 ENCOUNTER — Encounter: Payer: Self-pay | Admitting: Gastroenterology

## 2019-12-04 VITALS — BP 156/70 | HR 96 | Ht 61.0 in | Wt 109.0 lb

## 2019-12-04 DIAGNOSIS — R634 Abnormal weight loss: Secondary | ICD-10-CM | POA: Diagnosis not present

## 2019-12-04 DIAGNOSIS — R1310 Dysphagia, unspecified: Secondary | ICD-10-CM

## 2019-12-04 MED ORDER — LIDOCAINE 5 % EX PTCH: MEDICATED_PATCH | CUTANEOUS | 3 refills | Status: DC

## 2019-12-04 NOTE — Patient Instructions (Signed)
Eat small frequent meals  Drink 2 boost per day  We will send lidocaine patch to your pharmacy  I appreciate the  opportunity to care for you  Thank You   Harl Bowie , MD
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
## 2019-12-07 ENCOUNTER — Inpatient Hospital Stay: Payer: Medicare Other | Attending: Hematology

## 2019-12-07 ENCOUNTER — Other Ambulatory Visit: Payer: Self-pay

## 2019-12-07 VITALS — BP 115/59 | HR 72 | Temp 98.1°F | Resp 18

## 2019-12-07 DIAGNOSIS — R296 Repeated falls: Secondary | ICD-10-CM | POA: Diagnosis not present

## 2019-12-07 DIAGNOSIS — C7A8 Other malignant neuroendocrine tumors: Secondary | ICD-10-CM | POA: Insufficient documentation

## 2019-12-07 DIAGNOSIS — K219 Gastro-esophageal reflux disease without esophagitis: Secondary | ICD-10-CM | POA: Diagnosis not present

## 2019-12-07 DIAGNOSIS — D519 Vitamin B12 deficiency anemia, unspecified: Secondary | ICD-10-CM

## 2019-12-07 DIAGNOSIS — I700 Atherosclerosis of aorta: Secondary | ICD-10-CM | POA: Diagnosis not present

## 2019-12-07 DIAGNOSIS — R251 Tremor, unspecified: Secondary | ICD-10-CM | POA: Diagnosis not present

## 2019-12-07 DIAGNOSIS — F17210 Nicotine dependence, cigarettes, uncomplicated: Secondary | ICD-10-CM | POA: Insufficient documentation

## 2019-12-07 DIAGNOSIS — I6782 Cerebral ischemia: Secondary | ICD-10-CM | POA: Insufficient documentation

## 2019-12-07 DIAGNOSIS — K449 Diaphragmatic hernia without obstruction or gangrene: Secondary | ICD-10-CM | POA: Insufficient documentation

## 2019-12-07 DIAGNOSIS — Z79899 Other long term (current) drug therapy: Secondary | ICD-10-CM | POA: Insufficient documentation

## 2019-12-07 DIAGNOSIS — D513 Other dietary vitamin B12 deficiency anemia: Secondary | ICD-10-CM | POA: Diagnosis not present

## 2019-12-07 DIAGNOSIS — R110 Nausea: Secondary | ICD-10-CM | POA: Insufficient documentation

## 2019-12-07 DIAGNOSIS — R1310 Dysphagia, unspecified: Secondary | ICD-10-CM | POA: Insufficient documentation

## 2019-12-07 DIAGNOSIS — E538 Deficiency of other specified B group vitamins: Secondary | ICD-10-CM | POA: Diagnosis present

## 2019-12-07 DIAGNOSIS — Z9049 Acquired absence of other specified parts of digestive tract: Secondary | ICD-10-CM | POA: Diagnosis not present

## 2019-12-07 MED ORDER — CYANOCOBALAMIN 1000 MCG/ML IJ SOLN
INTRAMUSCULAR | Status: AC
  Filled 2019-12-07: qty 1

## 2019-12-07 MED ORDER — CYANOCOBALAMIN 1000 MCG/ML IJ SOLN
1000.0000 ug | Freq: Once | INTRAMUSCULAR | Status: AC
  Administered 2019-12-07: 1000 ug via INTRAMUSCULAR

## 2019-12-11 IMAGING — US US ABDOMEN COMPLETE
1 series · 14 of 25 positions shown · non-contrast
Comparison: Abdominal CT dated 04/28/2017

CLINICAL DATA: 76-year-old female with abdominal pain x3 weeks.
History of cholecystectomy.

EXAM:
ABDOMEN ULTRASOUND COMPLETE

[Series 1: us abdomen complete · 0.23mm/px · 14 of 93 slices shown]
[im 1/93]
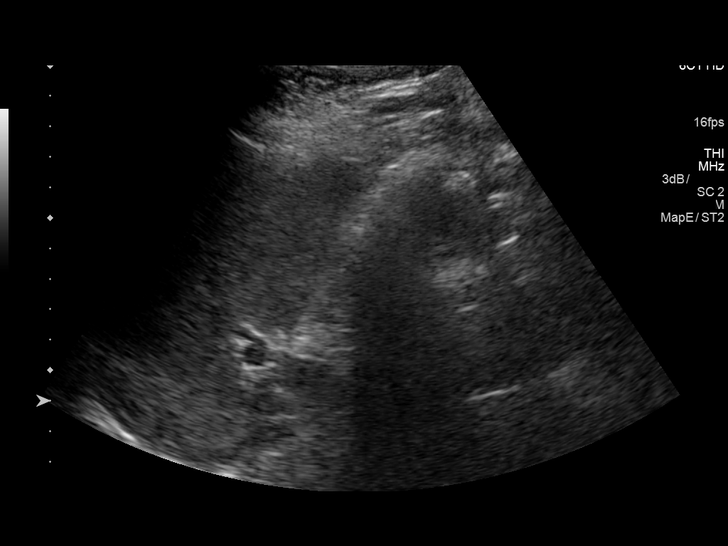
[im 8/93]
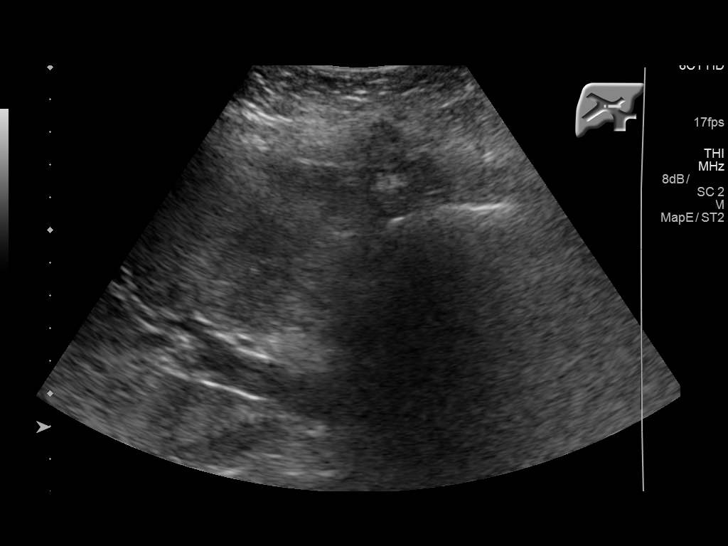
[im 16/93]
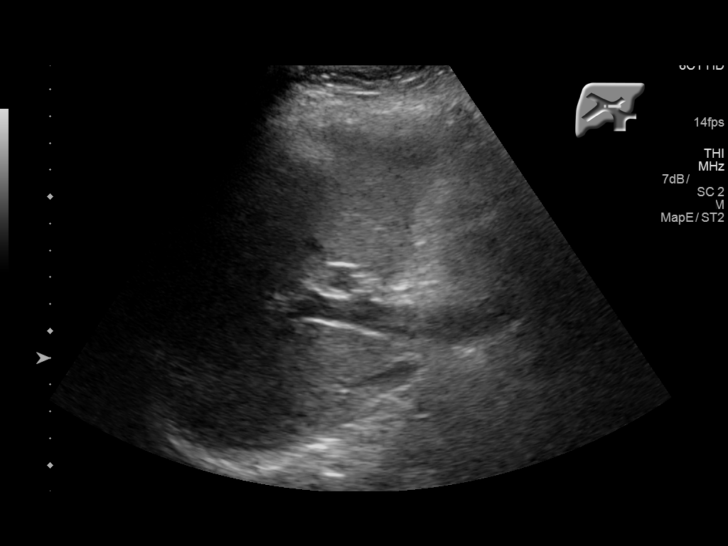
[im 24/93]
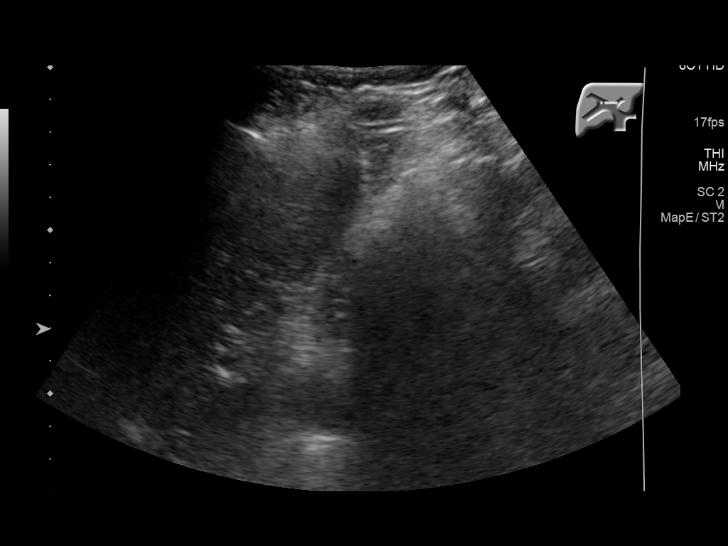
[im 31/93]
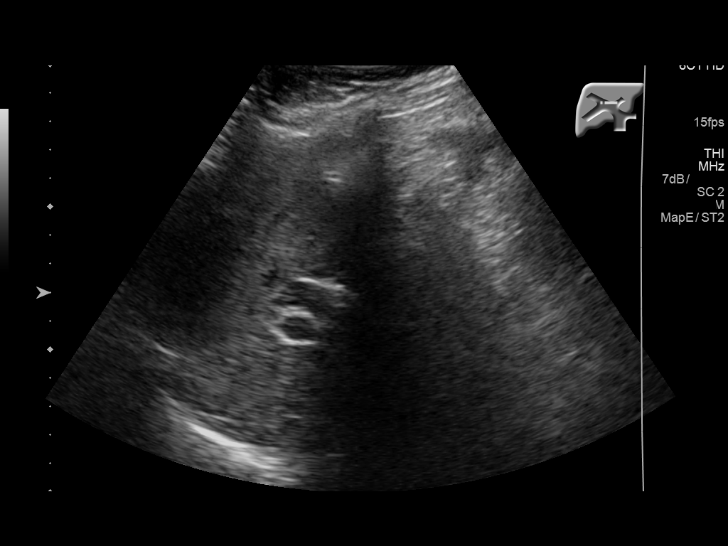
[im 35/93]
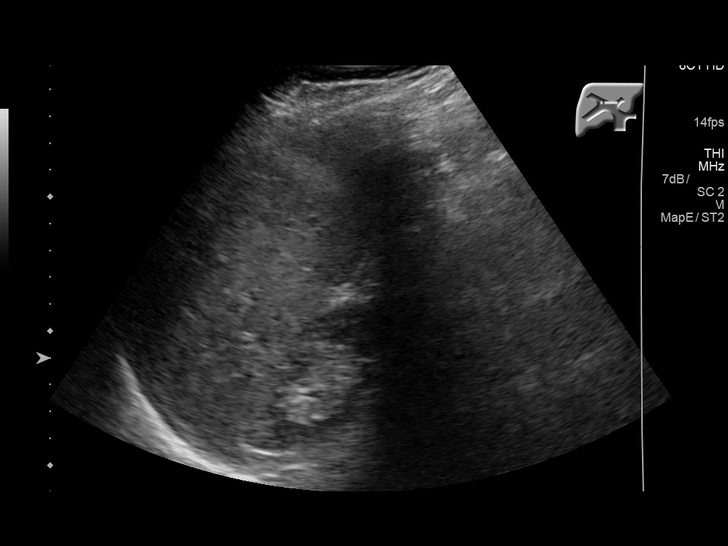
[im 43/93]
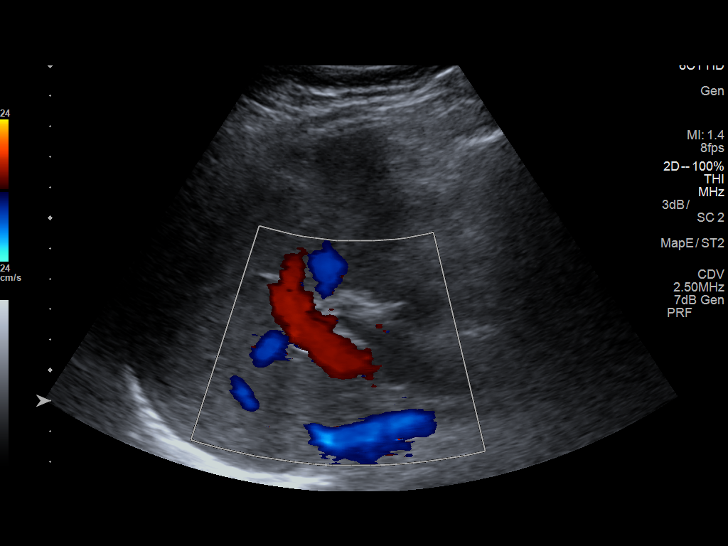
[im 50/93]
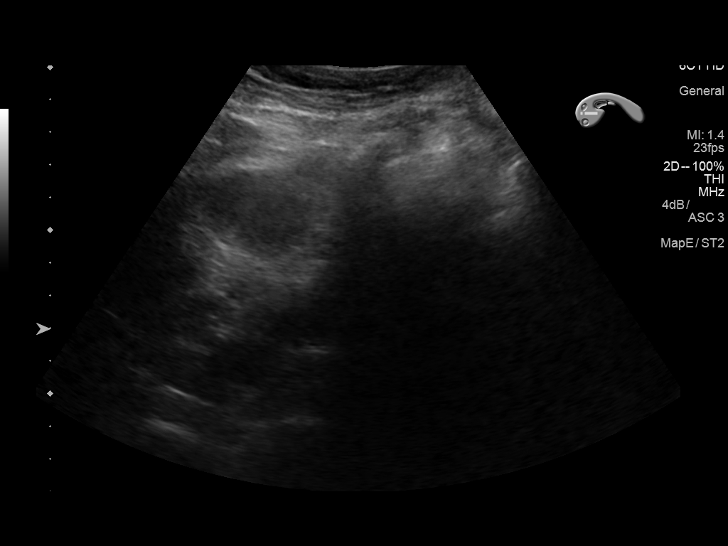
[im 58/93]
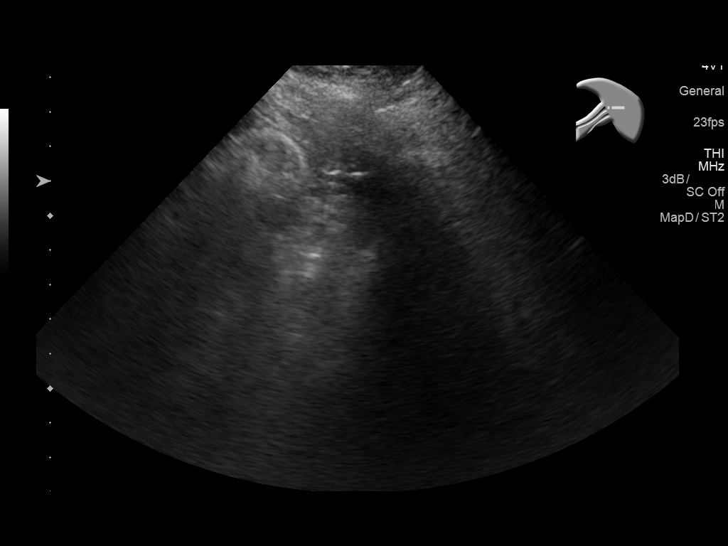
[im 62/93]
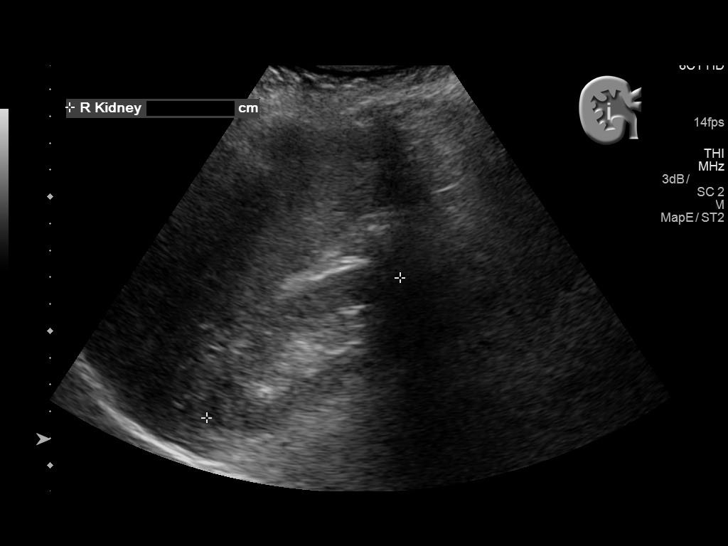
[im 70/93]
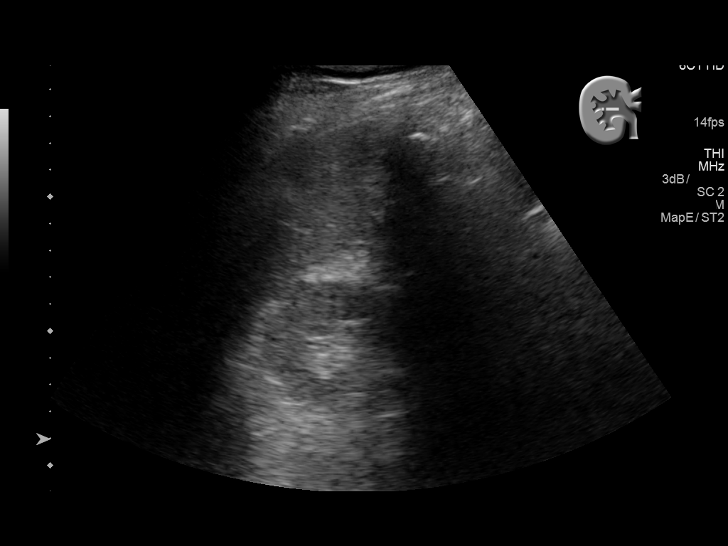
[im 77/93]
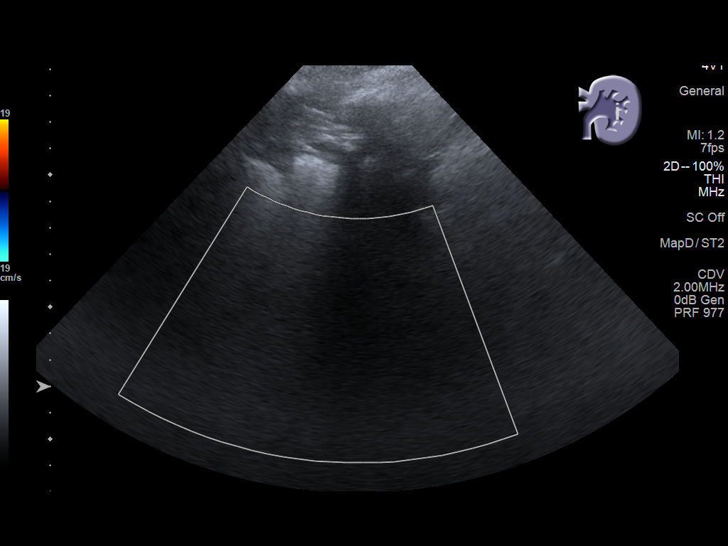
[im 85/93]
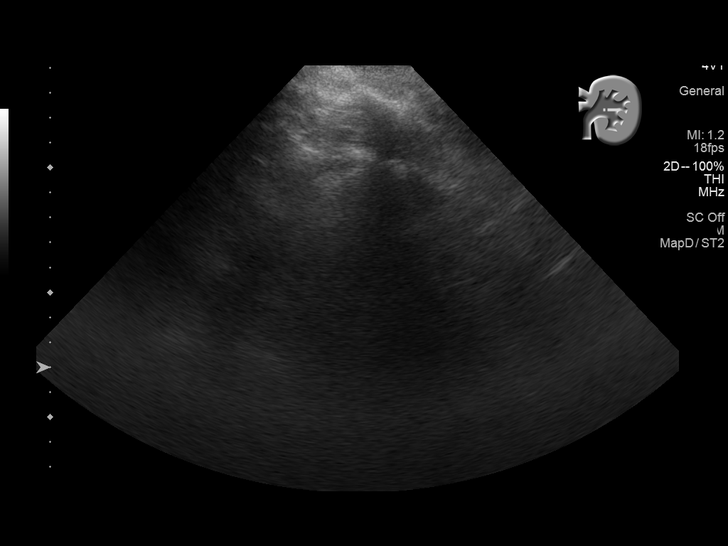
[im 93/93]
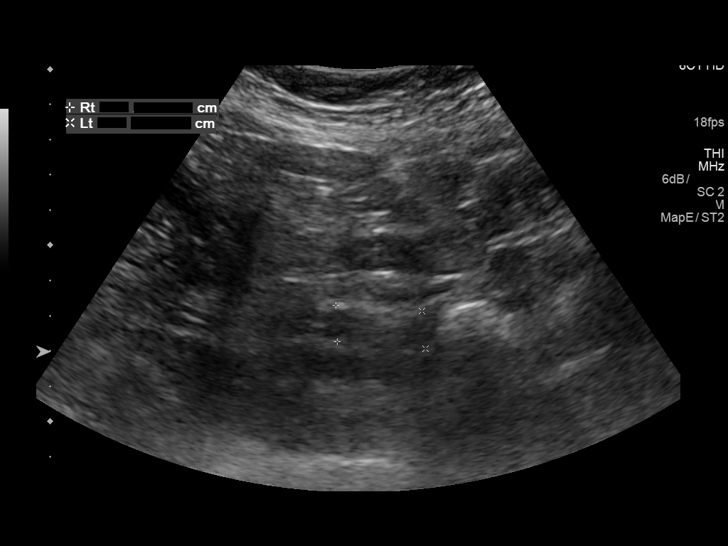

[14 of 25 positions shown; findings below may reference images not displayed]

FINDINGS: Gallbladder: Cholecystectomy.

Common bile duct: Diameter: 12 mm

Liver: There is increased liver echogenicity, likely related to
fatty infiltration. Portal vein is patent on color Doppler imaging
with normal direction of blood flow towards the liver.

IVC: No abnormality visualized.

Pancreas: Not visualized and obscured by bowel gas.

Spleen: Not visualized.

Right Kidney: Length: 9 cm.  No hydronephrosis or obstructing stone.

Left Kidney: Length: 9 cm.  The left kidney is poorly visualized.

Abdominal aorta: Suboptimally visualized. The visualized portions
appear unremarkable.

Other findings: None.
IMPRESSION: Limited study due to bowel gas.  No acute pathology identified.
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
## 2019-12-11 NOTE — Procedures (Signed)
Lumbosacral Transforaminal Epidural Steroid Injection - Sub-Pedicular Approach with Fluoroscopic Guidance  Patient: Emily [name]      Date of Birth: 08/04/1940 MRN: [id number] PCP: Sonia Side., FNP      Visit Date: 11/15/2019   Universal Protocol:    Date/Time: 11/15/2019  Consent Given By: the patient  Position: PRONE  Additional Comments: Vital signs were monitored before and after the procedure. Patient was prepped and draped in the usual sterile fashion. The correct patient, procedure, and site was verified.   Injection Procedure Details:  Procedure Site One Meds Administered:  Meds ordered this encounter  Medications  . methylPREDNISolone acetate (DEPO-MEDROL) injection 80 mg    Laterality: Bilateral  Location/Site:  L5-S1  Needle size: 22 G  Needle type: Spinal  Needle Placement: Transforaminal  Findings:    -Comments: Excellent flow of contrast along the nerve, nerve root and into the epidural space.  Procedure Details: After squaring off the end-plates to get a true AP view, the C-arm was positioned so that an oblique view of the foramen as noted above was visualized. The target area is just inferior to the "nose of the scotty dog" or sub pedicular. The soft tissues overlying this structure were infiltrated with 2-3 ml. of 1% Lidocaine without Epinephrine.  The spinal needle was inserted toward the target using a "trajectory" view along the fluoroscope beam.  Under AP and lateral visualization, the needle was advanced so it did not puncture dura and was located close the 6 O'Clock position of the pedical in AP tracterory. Biplanar projections were used to confirm position. Aspiration was confirmed to be negative for CSF and/or blood. A 1-2 ml. volume of Isovue-250 was injected and flow of contrast was noted at each level. Radiographs were obtained for documentation purposes.   After attaining the desired flow of contrast documented above, a 0.5 to 1.0 ml  test dose of 0.25% Marcaine was injected into each respective transforaminal space.  The patient was observed for 90 seconds post injection.  After no sensory deficits were reported, and normal lower extremity motor function was noted,   the above injectate was administered so that equal amounts of the injectate were placed at each foramen (level) into the transforaminal epidural space.   Additional Comments:  The patient tolerated the procedure well Dressing: 2 x 2 sterile gauze and Band-Aid    Post-procedure details: Patient was observed during the procedure. Post-procedure instructions were reviewed.  Patient left the clinic in stable condition.

## 2019-12-14 ENCOUNTER — Other Ambulatory Visit: Payer: Self-pay

## 2019-12-14 ENCOUNTER — Inpatient Hospital Stay: Payer: Medicare Other

## 2019-12-14 ENCOUNTER — Telehealth: Payer: Self-pay | Admitting: Hematology

## 2019-12-14 VITALS — BP 106/61 | HR 69 | Temp 98.0°F | Resp 16

## 2019-12-14 DIAGNOSIS — D519 Vitamin B12 deficiency anemia, unspecified: Secondary | ICD-10-CM

## 2019-12-14 DIAGNOSIS — D649 Anemia, unspecified: Secondary | ICD-10-CM

## 2019-12-14 DIAGNOSIS — C7A8 Other malignant neuroendocrine tumors: Secondary | ICD-10-CM

## 2019-12-14 LAB — COMPREHENSIVE METABOLIC PANEL
ALT: 36 U/L (ref 0–44)
AST: 40 U/L (ref 15–41)
Albumin: 3.3 g/dL — ABNORMAL LOW (ref 3.5–5.0)
Alkaline Phosphatase: 84 U/L (ref 38–126)
Anion gap: 5 (ref 5–15)
BUN: 16 mg/dL (ref 8–23)
CO2: 26 mmol/L (ref 22–32)
Calcium: 9.1 mg/dL (ref 8.9–10.3)
Chloride: 110 mmol/L (ref 98–111)
Creatinine, Ser: 0.8 mg/dL (ref 0.44–1.00)
GFR calc Af Amer: >60 mL/min (ref >60)
GFR calc non Af Amer: >60 mL/min (ref >60)
Glucose, Bld: 98 mg/dL (ref 70–99)
Potassium: 4.4 mmol/L (ref 3.5–5.1)
Sodium: 141 mmol/L (ref 135–145)
Total Bilirubin: 0.3 mg/dL (ref 0.3–1.2)
Total Protein: 6.5 g/dL (ref 6.5–8.1)

## 2019-12-14 LAB — CBC WITH DIFFERENTIAL/PLATELET
Abs Immature Granulocytes: 0.02 10*3/uL (ref 0.00–0.07)
Basophils Absolute: 0 10*3/uL (ref 0.0–0.1)
Basophils Relative: 0 %
Eosinophils Absolute: 0 10*3/uL (ref 0.0–0.5)
Eosinophils Relative: 1 %
HCT: 34.9 % — ABNORMAL LOW (ref 36.0–46.0)
Hemoglobin: 11.1 g/dL — ABNORMAL LOW (ref 12.0–15.0)
Immature Granulocytes: 0 %
Lymphocytes Relative: 29 %
Lymphs Abs: 2.2 10*3/uL (ref 0.7–4.0)
MCH: 24.4 pg — ABNORMAL LOW (ref 26.0–34.0)
MCHC: 31.8 g/dL (ref 30.0–36.0)
MCV: 76.9 fL — ABNORMAL LOW (ref 80.0–100.0)
Monocytes Absolute: 0.4 10*3/uL (ref 0.1–1.0)
Monocytes Relative: 6 %
Neutro Abs: 4.7 10*3/uL (ref 1.7–7.7)
Neutrophils Relative %: 64 %
Platelets: 230 10*3/uL (ref 150–400)
RBC: 4.54 MIL/uL (ref 3.87–5.11)
RDW: 14.7 % (ref 11.5–15.5)
WBC: 7.4 10*3/uL (ref 4.0–10.5)
nRBC: 0 % (ref 0.0–0.2)

## 2019-12-14 LAB — IRON AND TIBC
Iron: 100 ug/dL (ref 41–142)
Saturation Ratios: 33 % (ref 21–57)
TIBC: 300 ug/dL (ref 236–444)
UIBC: 200 ug/dL (ref 120–384)

## 2019-12-14 LAB — FERRITIN: Ferritin: 67 ng/mL (ref 11–307)

## 2019-12-14 MED ORDER — CYANOCOBALAMIN 1000 MCG/ML IJ SOLN: 1000.0000 ug | Freq: Once | INTRAMUSCULAR | Status: DC

## 2019-12-14 NOTE — Telephone Encounter (Signed)
[id number] Faxing pt records to [facility] @ fax [phone]

## 2019-12-14 NOTE — Progress Notes (Signed)
Pt last injection for b12 8/5.  Pt scheduled today for another injection, only 1 week later.  Supportive therapy plan and office note state B 12 injection every 2 weeks.  Injection too early at this point.  Verified with pharmacy.  Injection not given.

## 2019-12-15 LAB — CHROMOGRANIN A: Chromogranin A (ng/mL): 62.7 ng/mL (ref 0.0–101.8)
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
## 2019-12-16 ENCOUNTER — Other Ambulatory Visit: Payer: Self-pay | Admitting: Gastroenterology

## 2019-12-17 LAB — METHYLMALONIC ACID, SERUM: Methylmalonic Acid, Quantitative: 126 nmol/L (ref 0–378)

## 2019-12-19 ENCOUNTER — Other Ambulatory Visit: Payer: Self-pay | Admitting: Gastroenterology
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
## 2019-12-28 ENCOUNTER — Other Ambulatory Visit: Payer: Self-pay

## 2019-12-28 ENCOUNTER — Other Ambulatory Visit: Payer: Self-pay | Admitting: *Deleted

## 2019-12-28 ENCOUNTER — Encounter: Payer: Self-pay | Admitting: Hematology

## 2019-12-28 ENCOUNTER — Inpatient Hospital Stay: Payer: Medicare Other

## 2019-12-28 ENCOUNTER — Inpatient Hospital Stay (HOSPITAL_BASED_OUTPATIENT_CLINIC_OR_DEPARTMENT_OTHER): Payer: Medicare Other | Admitting: Hematology

## 2019-12-28 VITALS — BP 125/71 | HR 96 | Temp 97.6°F | Resp 18 | Ht 61.0 in | Wt 108.1 lb

## 2019-12-28 DIAGNOSIS — E538 Deficiency of other specified B group vitamins: Secondary | ICD-10-CM | POA: Diagnosis not present

## 2019-12-28 DIAGNOSIS — C7A8 Other malignant neuroendocrine tumors: Secondary | ICD-10-CM | POA: Diagnosis not present

## 2019-12-28 DIAGNOSIS — D519 Vitamin B12 deficiency anemia, unspecified: Secondary | ICD-10-CM

## 2019-12-28 MED ORDER — CYANOCOBALAMIN 1000 MCG/ML IJ SOLN
1000.0000 ug | Freq: Once | INTRAMUSCULAR | Status: AC
  Administered 2019-12-28: 1000 ug via INTRAMUSCULAR

## 2019-12-28 MED ORDER — CYANOCOBALAMIN 1000 MCG/ML IJ SOLN
INTRAMUSCULAR | Status: AC
  Filled 2019-12-28: qty 1

## 2019-12-29 ENCOUNTER — Telehealth: Payer: Self-pay | Admitting: Hematology

## 2019-12-29 NOTE — Telephone Encounter (Signed)
Scheduled per 8/26 los. Pt is aware of appts. Mailing appt calendar per pt request.

## 2020-01-01 ENCOUNTER — Other Ambulatory Visit: Payer: Self-pay | Admitting: Podiatry

## 2020-01-01 LAB — 5 HIAA, QUANTITATIVE, URINE, 24 HOUR
5-HIAA, Ur: 4.9 mg/L
5-HIAA,Quant.,24 Hr Urine: 2 mg/24 hr (ref 0.0–14.9)
Total Volume: 400

## 2020-01-04 NOTE — Procedures (Signed)
Lumbar Facet Joint Intra-Articular Injection(s) with Fluoroscopic Guidance  Patient: Emily [name]      Date of Birth: 30-Apr-1941 MRN: [id number] PCP: Sonia Side., FNP      Visit Date: 11/30/2019   Universal Protocol:    Date/Time: 11/30/2019  Consent Given By: the patient  Position: PRONE   Additional Comments: Vital signs were monitored before and after the procedure. Patient was prepped and draped in the usual sterile fashion. The correct patient, procedure, and site was verified.   Injection Procedure Details:  Procedure Site One Meds Administered:  Meds ordered this encounter  Medications  . methylPREDNISolone acetate (DEPO-MEDROL) injection 80 mg     Laterality: Bilateral  Location/Site:  L5-S1  Needle size: 22 guage  Needle type: Spinal  Needle Placement: Articular  Findings:  -Comments: Excellent flow of contrast producing a partial arthrogram.  Procedure Details: The fluoroscope beam is vertically oriented in AP, and the inferior recess is visualized beneath the lower pole of the inferior apophyseal process, which represents the target point for needle insertion. When direct visualization is difficult the target point is located at the medial projection of the vertebral pedicle. The region overlying each aforementioned target is locally anesthetized with a 1 to 2 ml. volume of 1% Lidocaine without Epinephrine.   The spinal needle was inserted into each of the above mentioned facet joints using biplanar fluoroscopic guidance. A 0.25 to 0.5 ml. volume of Isovue-250 was injected and a partial facet joint arthrogram was obtained. A single spot film was obtained of the resulting arthrogram.    One to 1.25 ml of the steroid/anesthetic solution was then injected into each of the facet joints noted above.   Additional Comments:  The patient tolerated the procedure well Dressing: 2 x 2 sterile gauze and Band-Aid    Post-procedure details: Patient was  observed during the procedure. Post-procedure instructions were reviewed.  Patient left the clinic in stable condition.

## 2020-01-04 NOTE — Progress Notes (Signed)
Emily [name] - 79 y.o. female MRN [id number]  Date of birth: 1940-12-30  Office Visit Note: Visit Date: 11/30/2019 PCP: Sonia Side., FNP Referred by: Sonia Side., FNP  Subjective: Chief Complaint  Patient presents with  . Lower Back - Pain  . Right Hip - Pain   HPI:  Emily [name] is a 79 y.o. female who comes in today at the request of Dr. Jean Rosenthal for planned Bilateral L5-S1 Lumbar facet/medial branch block with fluoroscopic guidance.  The patient has failed conservative care including home exercise, medications, time and activity modification.  This injection will be diagnostic and hopefully therapeutic.  Please see requesting physician notes for further details and justification.  Exam has shown concordant pain with facet joint loading.  Patient has done well in the past with very infrequent bilateral L5 transforaminal epidural steroid injections.  We done a couple of injections this year however that is just not giving her much relief.  She still complains of bilateral low back pain with right hip pain.  She does get leg complaints but not really radicular pain.  She may wish to follow-up with a neurologist.  MRI from 2017 does not show any high-grade central stenosis or nerve compression.  We are going to try bilateral facet joint blocks with the hope that maybe at least it would help most of her back pain we could look at potential for radiofrequency ablation.  If it does not help very much at all and I think she does need an MRI of the lumbar spine.  ROS Otherwise per HPI.  Assessment & Plan: Visit Diagnoses:  1. Spondylosis without myelopathy or radiculopathy, lumbar region     Plan: No additional findings.   Meds & Orders:  Meds ordered this encounter  Medications  . methylPREDNISolone acetate (DEPO-MEDROL) injection 80 mg    Orders Placed This Encounter  Procedures  . Facet Injection  . XR C-ARM NO REPORT    Follow-up: Return for Consider  Lspine MRI.   Procedures: No procedures performed  Lumbar Facet Joint Intra-Articular Injection(s) with Fluoroscopic Guidance  Patient: Emily [name]      Date of Birth: Oct 14, 1940 MRN: [id number] PCP: Sonia Side., FNP      Visit Date: 11/30/2019   Universal Protocol:    Date/Time: 11/30/2019  Consent Given By: the patient  Position: PRONE   Additional Comments: Vital signs were monitored before and after the procedure. Patient was prepped and draped in the usual sterile fashion. The correct patient, procedure, and site was verified.   Injection Procedure Details:  Procedure Site One Meds Administered:  Meds ordered this encounter  Medications  . methylPREDNISolone acetate (DEPO-MEDROL) injection 80 mg     Laterality: Bilateral  Location/Site:  L5-S1  Needle size: 22 guage  Needle type: Spinal  Needle Placement: Articular  Findings:  -Comments: Excellent flow of contrast producing a partial arthrogram.  Procedure Details: The fluoroscope beam is vertically oriented in AP, and the inferior recess is visualized beneath the lower pole of the inferior apophyseal process, which represents the target point for needle insertion. When direct visualization is difficult the target point is located at the medial projection of the vertebral pedicle. The region overlying each aforementioned target is locally anesthetized with a 1 to 2 ml. volume of 1% Lidocaine without Epinephrine.   The spinal needle was inserted into each of the above mentioned facet joints using biplanar fluoroscopic guidance. A 0.25 to 0.5 ml. volume of Isovue-250  was injected and a partial facet joint arthrogram was obtained. A single spot film was obtained of the resulting arthrogram.    One to 1.25 ml of the steroid/anesthetic solution was then injected into each of the facet joints noted above.   Additional Comments:  The patient tolerated the procedure well Dressing: 2 x 2 sterile gauze and  Band-Aid    Post-procedure details: Patient was observed during the procedure. Post-procedure instructions were reviewed.  Patient left the clinic in stable condition.     Clinical History: IMPRESSION: 1. At L5-S1 there is a broad-based disc bulge with bilateral facet arthropathy and right lateral recess stenosis. 2. At L1-2 there is a broad-based disc bulge eccentric towards the right. Mild bilateral facet arthropathy.   Electronically Signed   By: Kathreen Devoid   On: 03/24/2016 16:05     Objective:  VS:  HT:    WT:   BMI:     BP:(!) 118/64  HR:78bpm  TEMP: ( )  RESP:  Physical Exam Constitutional:      General: She is not in acute distress.    Appearance: Normal appearance. She is not ill-appearing.  HENT:     Head: Normocephalic and atraumatic.     Right Ear: External ear normal.     Left Ear: External ear normal.  Eyes:     Extraocular Movements: Extraocular movements intact.  Cardiovascular:     Rate and Rhythm: Normal rate.     Pulses: Normal pulses.  Musculoskeletal:     Right lower leg: No edema.     Left lower leg: No edema.     Comments: Patient has good distal strength with no pain over the greater trochanters.  No clonus or focal weakness.  Skin:    Findings: No erythema, lesion or rash.  Neurological:     General: No focal deficit present.     Mental Status: She is alert and oriented to person, place, and time.     Sensory: No sensory deficit.     Motor: No weakness or abnormal muscle tone.     Coordination: Coordination normal.  Psychiatric:        Mood and Affect: Mood normal.        Behavior: Behavior normal.      Imaging: No results found.

## 2020-01-11 ENCOUNTER — Other Ambulatory Visit: Payer: Self-pay

## 2020-01-11 ENCOUNTER — Inpatient Hospital Stay: Payer: Medicare Other | Attending: Hematology

## 2020-01-11 VITALS — BP 112/57 | HR 71

## 2020-01-11 DIAGNOSIS — D519 Vitamin B12 deficiency anemia, unspecified: Secondary | ICD-10-CM

## 2020-01-11 MED ORDER — CYANOCOBALAMIN 1000 MCG/ML IJ SOLN
1000.0000 ug | Freq: Once | INTRAMUSCULAR | Status: AC
  Administered 2020-01-11: 1000 ug via INTRAMUSCULAR

## 2020-01-11 NOTE — Patient Instructions (Signed)
Cyanocobalamin, Pyridoxine, and Folate What is this medicine? A multivitamin containing folic acid, vitamin B6, and vitamin B12. This medicine may be used for other purposes; ask your health care provider or pharmacist if you have questions. COMMON BRAND NAME(S): AllanFol RX, AllanTex, Av-Vite FB, B Complex with Folic Acid, ComBgen, FaBB, Folamin, Folastin, Folbalin, Folbee, Folbic, Folcaps, Folgard, Folgard RX, Folgard RX 2.2, Folplex, Folplex 2.2, Foltabs 800, Foltx, Homocysteine Formula, Niva-Fol, NuFol, TL Gard RX, Virt-Gard, Virt-Vite, Virt-Vite Forte, Vita-Respa What should I tell my health care provider before I take this medicine? They need to know if you have any of these conditions:  bleeding or clotting disorder  history of anemia of any type  other chronic health condition  an unusual or allergic reaction to vitamins, other medicines, foods, dyes, or preservatives  pregnant or trying to get pregnant  breast-feeding How should I use this medicine? Take by mouth with a glass of water. May take with food. Follow the directions on the prescription label. It is usually given once a day. Do not take your medicine more often than directed. Contact your pediatrician regarding the use of this medicine in children. Special care may be needed. Overdosage: If you think you have taken too much of this medicine contact a poison control center or emergency room at once. NOTE: This medicine is only for you. Do not share this medicine with others. What if I miss a dose? If you miss a dose, take it as soon as you can. If it is almost time for your next dose, take only that dose. Do not take double or extra doses. What may interact with this medicine?  levodopa This list may not describe all possible interactions. Give your health care provider a list of all the medicines, herbs, non-prescription drugs, or dietary supplements you use. Also tell them if you smoke, drink alcohol, or use illegal  drugs. Some items may interact with your medicine. What should I watch for while using this medicine? See your health care professional for regular checks on your progress. Remember that vitamin supplements do not replace the need for good nutrition from a balanced diet. What side effects may I notice from receiving this medicine? Side effects that you should report to your doctor or health care professional as soon as possible:  allergic reaction such as skin rash or difficulty breathing  vomiting Side effects that usually do not require medical attention (report to your doctor or health care professional if they continue or are bothersome):  nausea  stomach upset This list may not describe all possible side effects. Call your doctor for medical advice about side effects. You may report side effects to FDA at 1-800-FDA-1088. Where should I keep my medicine? Keep out of the reach of children. Most vitamins should be stored at controlled room temperature. Check your specific product directions. Protect from heat and moisture. Throw away any unused medicine after the expiration date. NOTE: This sheet is a summary. It may not cover all possible information. If you have questions about this medicine, talk to your doctor, pharmacist, or health care provider.  2020 Elsevier/Gold Standard (2007-06-11 00:59:55)  

## 2020-01-18 ENCOUNTER — Telehealth: Payer: Self-pay | Admitting: Neurology

## 2020-01-18 NOTE — Telephone Encounter (Signed)
I called PT to r/s from bump list and she said she was confused why she had an appt here because she said Dr. Jannifer Franklin had told her to see a different dr. She said she does not have an appt elsewhere so I told her to keep her appt with [place] and I would have a nurse look into this and give her a call back.

## 2020-01-18 NOTE — Telephone Encounter (Signed)
Spoke to pt, she states she is not interested in waiting until December to be seen by another Garment/textile technologist. She states her PCP recommended Dr Audelia Acton in [place]  Fax number [phone] Phone # [phone]

## 2020-01-19 ENCOUNTER — Other Ambulatory Visit: Payer: Self-pay | Admitting: Gastroenterology

## 2020-01-19 ENCOUNTER — Telehealth: Payer: Self-pay | Admitting: Gastroenterology

## 2020-01-19 NOTE — Telephone Encounter (Signed)
Nexium refills sent to pts pharmacy

## 2020-01-19 NOTE — Telephone Encounter (Signed)
Patient needs to refill Nexium. Please send it to [facility] on [address].

## 2020-01-21 IMAGING — CT CT ABDOMEN AND PELVIS WITH CONTRAST
2 of 5 series · 16 of 46 positions shown, 18 images · IV contrast (OMNIPAQUE)
Comparison: 08/10/2017

CLINICAL DATA: Malignant neuroendocrine tumor of the appendix

EXAM:
CT ABDOMEN AND PELVIS WITH CONTRAST
TECHNIQUE: Multidetector CT imaging of the abdomen and pelvis was performed
using the standard protocol following bolus administration of
intravenous contrast.
CONTRAST:  75mL OMNIPAQUE IOHEXOL 300 MG/ML SOLN, additional oral
enteric contrast

[Series 2: axial st · axial · 0.75mm/px · z∈[+1174,+1524]mm · 13 of 82 slices shown, 15 images]
[im 6/82  soft-tissue]
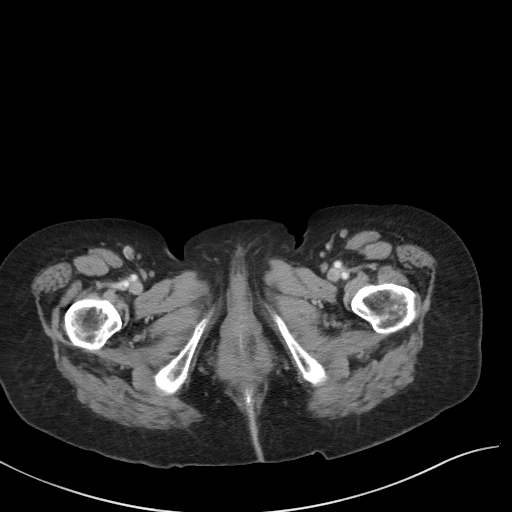
[im 6/82  bone]
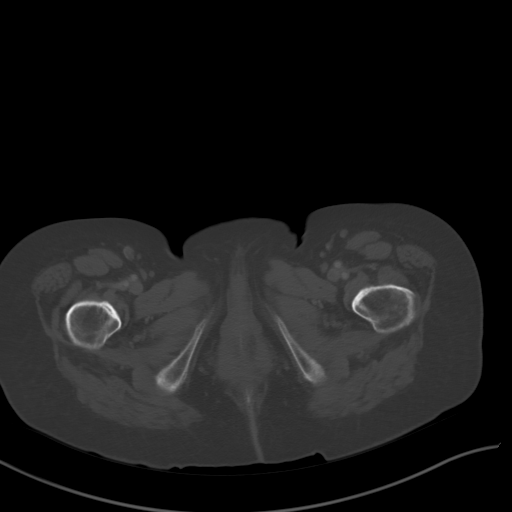
[im 11/82  soft-tissue]
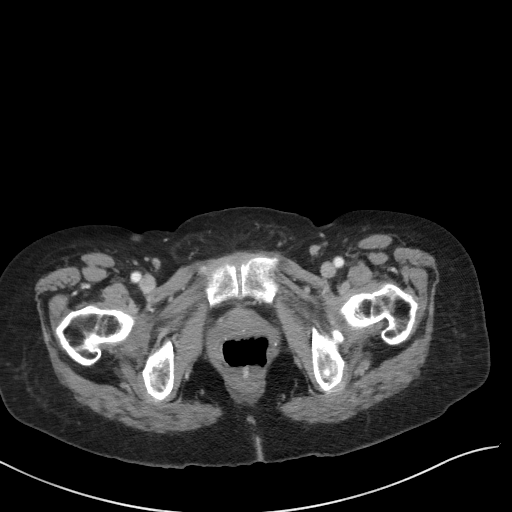
[im 17/82  soft-tissue]
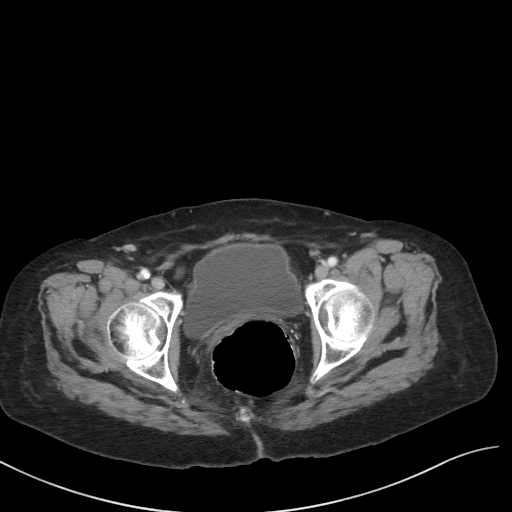
[im 22/82  soft-tissue]
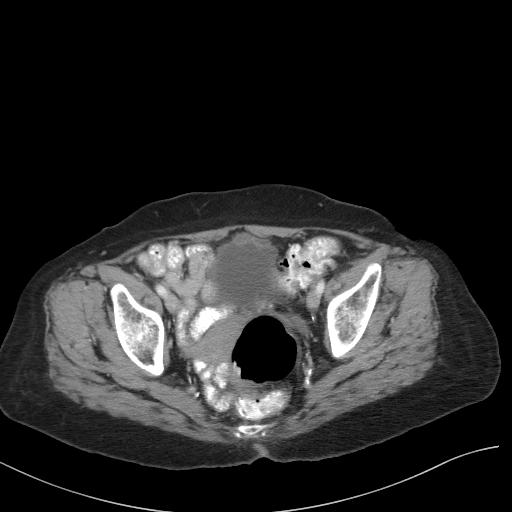
[im 28/82  soft-tissue]
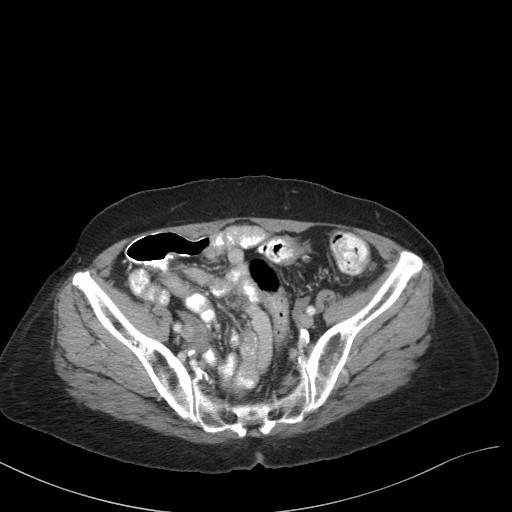
[im 33/82  soft-tissue]
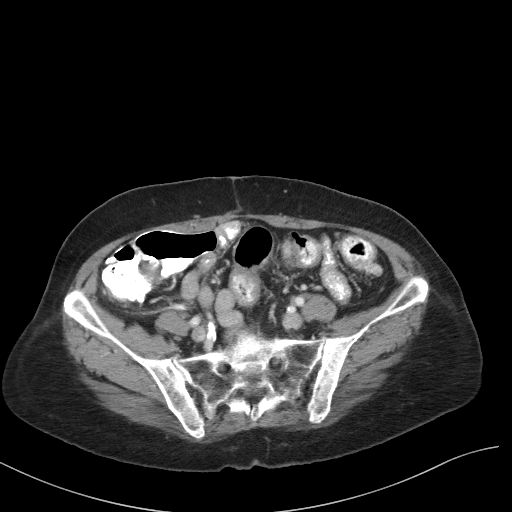
[im 44/82  soft-tissue]
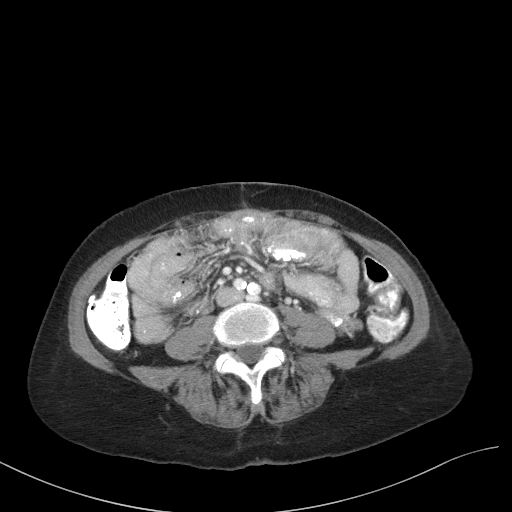
[im 49/82  soft-tissue]
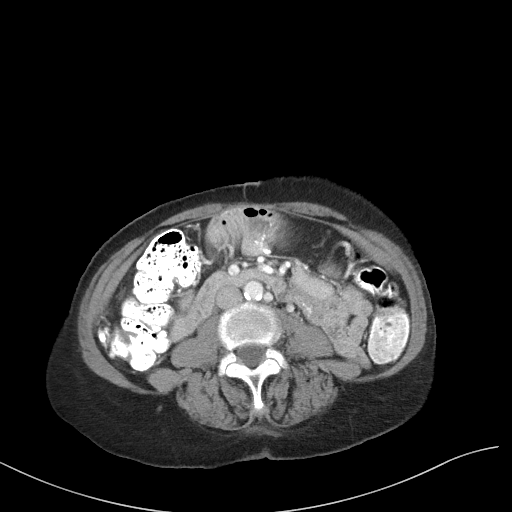
[im 55/82  soft-tissue]
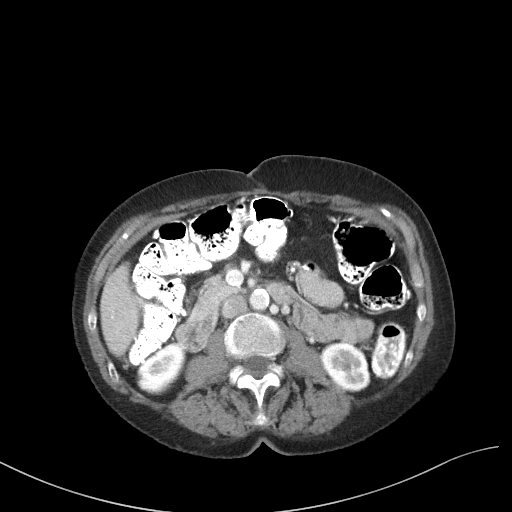
[im 55/82  bone]
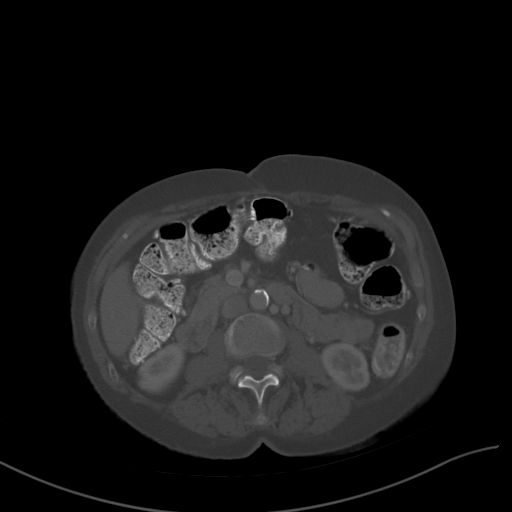
[im 60/82  soft-tissue]
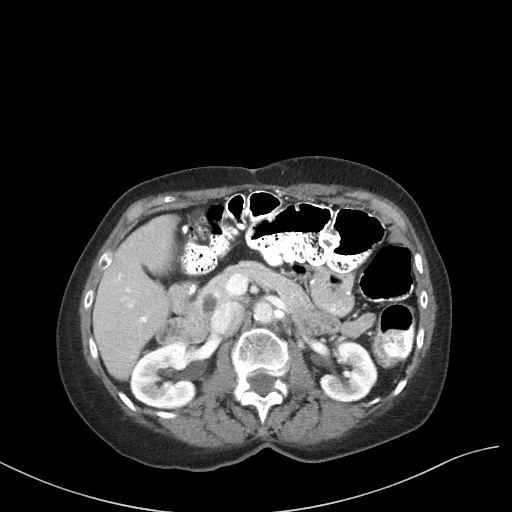
[im 65/82  soft-tissue]
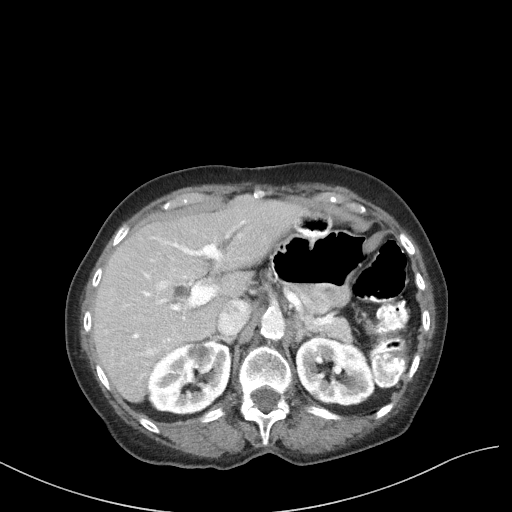
[im 71/82  soft-tissue]
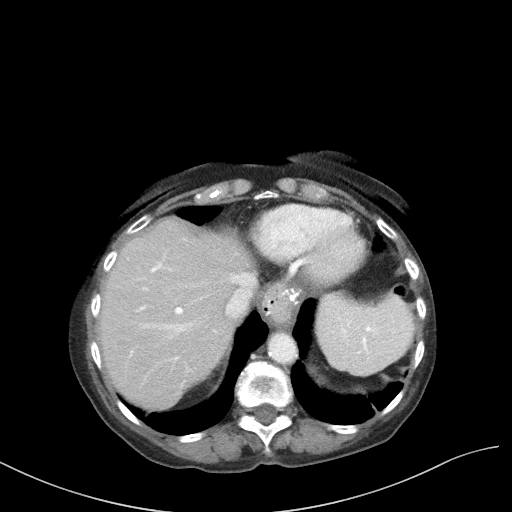
[im 76/82  soft-tissue]
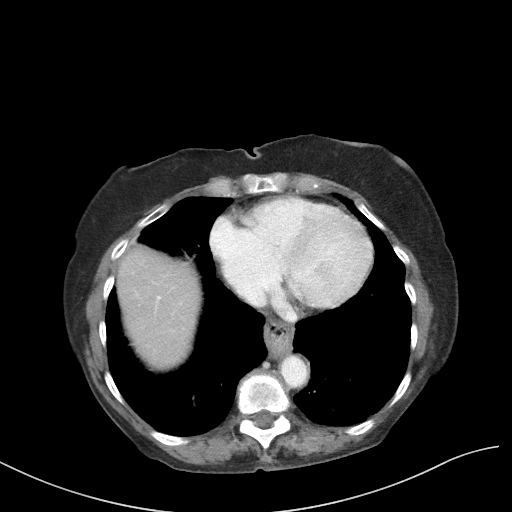

[Series 4: coronal st · coronal · 0.67mm/px · 3 of 71 slices shown]
[im 24/71  soft-tissue]
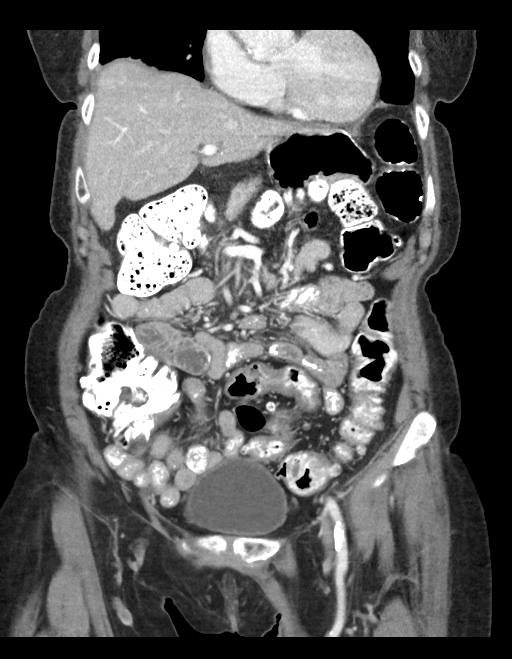
[im 32/71  soft-tissue]
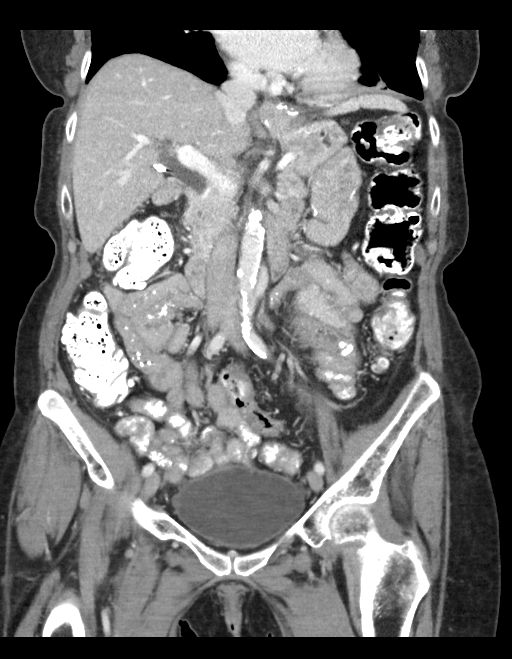
[im 39/71  soft-tissue]
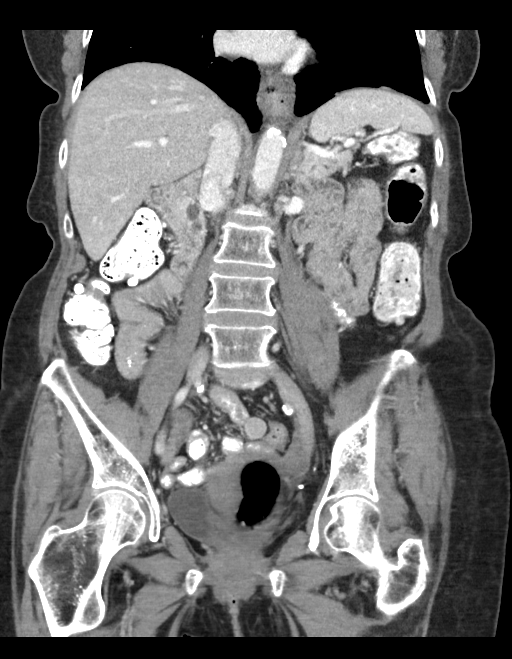

[16 of 46 positions shown; findings below may reference images not displayed]

FINDINGS: Lower chest: No acute abnormality.  Bibasilar scarring.

Hepatobiliary: No focal liver abnormality is seen. Status post
cholecystectomy with postoperative biliary ductal dilatation.

Pancreas: Unremarkable. No pancreatic ductal dilatation or
surrounding inflammatory changes.

Spleen: Normal in size without focal abnormality.

Adrenals/Urinary Tract: Adrenal glands are unremarkable. Kidneys are
normal, without renal calculi, focal lesion, or hydronephrosis.
Bladder is unremarkable.

Stomach/Bowel: There are postoperative findings about the
gastroesophageal junction and stomach, likely related to partial
gastrectomy and Roux anatomy. The distal esophagus remains fluid and
debris filled. The appendix is surgically absent. No evidence of
bowel wall thickening, distention, or inflammatory changes.
Pancolonic diverticulosis.

Vascular/Lymphatic: Calcific atherosclerosis. No enlarged abdominal
or pelvic lymph nodes.

Reproductive: No mass or other abnormality.

Other: No abdominal wall hernia or abnormality. No abdominopelvic
ascites.

Musculoskeletal: No acute or significant osseous findings.
IMPRESSION: 1. Status post appendectomy without evidence of recurrent soft
tissue or lymphadenopathy. Previously described soft tissue at the
cecal base is not appreciated on today's examination. No evidence of
metastatic disease in the abdomen or pelvis.

2. Chronic, incidental, and postoperative findings as detailed
above.

## 2020-01-25 ENCOUNTER — Other Ambulatory Visit: Payer: Self-pay

## 2020-01-25 ENCOUNTER — Inpatient Hospital Stay: Payer: Medicare Other

## 2020-01-25 VITALS — BP 126/71 | HR 70 | Temp 98.3°F | Resp 18

## 2020-01-25 DIAGNOSIS — D519 Vitamin B12 deficiency anemia, unspecified: Secondary | ICD-10-CM

## 2020-01-25 MED ORDER — CYANOCOBALAMIN 1000 MCG/ML IJ SOLN
INTRAMUSCULAR | Status: AC
  Filled 2020-01-25: qty 1

## 2020-01-25 MED ORDER — CYANOCOBALAMIN 1000 MCG/ML IJ SOLN
1000.0000 ug | Freq: Once | INTRAMUSCULAR | Status: AC
  Administered 2020-01-25: 1000 ug via INTRAMUSCULAR

## 2020-01-30 ENCOUNTER — Other Ambulatory Visit: Payer: Self-pay | Admitting: Gastroenterology

## 2020-01-31 ENCOUNTER — Encounter: Payer: Self-pay | Admitting: Podiatry

## 2020-01-31 ENCOUNTER — Ambulatory Visit (INDEPENDENT_AMBULATORY_CARE_PROVIDER_SITE_OTHER): Payer: Medicare Other | Admitting: Podiatry

## 2020-01-31 ENCOUNTER — Other Ambulatory Visit: Payer: Self-pay | Admitting: Podiatry

## 2020-01-31 ENCOUNTER — Other Ambulatory Visit: Payer: Self-pay

## 2020-01-31 DIAGNOSIS — E0842 Diabetes mellitus due to underlying condition with diabetic polyneuropathy: Secondary | ICD-10-CM

## 2020-01-31 DIAGNOSIS — B351 Tinea unguium: Secondary | ICD-10-CM

## 2020-01-31 DIAGNOSIS — M79675 Pain in left toe(s): Secondary | ICD-10-CM

## 2020-01-31 DIAGNOSIS — M79674 Pain in right toe(s): Secondary | ICD-10-CM | POA: Diagnosis not present

## 2020-01-31 NOTE — Telephone Encounter (Signed)
completed

## 2020-01-31 NOTE — Progress Notes (Signed)
This patient returns to my office for at risk foot care.  This patient requires this care by a professional since this patient will be at risk due to having  Diabetes.  This patient is unable to cut nails herself since the patient cannot reach her nails.These nails are painful walking and wearing shoes.  This patient presents for at risk foot care today. She also says she is having significant peeling in the back of her right foot and bottom of her heel.  She was doing better applying amlactin but the amlactin started to not work.  She presents to the office to discuss her peeling.  General Appearance  Alert, conversant and in no acute stress.  Vascular  Dorsalis pedis and posterior tibial  pulses are palpable  bilaterally.  Capillary return is within normal limits  bilaterally. Temperature is within normal limits  bilaterally.  Neurologic  Senn-Weinstein monofilament wire test absent   bilaterally. Muscle power within normal limits bilaterally.  Nails Thick disfigured discolored nails with subungual debris  from hallux to fifth toes bilaterally. No evidence of bacterial infection or drainage bilaterally.  Orthopedic  No limitations of motion  feet .  No crepitus or effusions noted.  No bony pathology or digital deformities noted.  Skin  normotropic skin with no porokeratosis noted bilaterally.  No signs of infections or ulcers noted.   Peeling noted plantar right heel.  Onychomycosis  Pain in right toes  Pain in left toes  Consent was obtained for treatment procedures.   Mechanical debridement of nails 1-5  bilaterally performed with a nail nipper.  Filed with dremel without incident.  Discussed the peeling with this patient.  Since the amlactin is not working I recommended she try cortaid  1%.    Return office visit   3 months                   Told patient to return for periodic foot care and evaluation due to potential at risk complications.   Gardiner Barefoot DPM

## 2020-01-31 NOTE — Telephone Encounter (Signed)
Please advise , last visit on 10/25/19, last prescription approved on 01/02/20

## 2020-02-06 ENCOUNTER — Telehealth: Payer: Self-pay | Admitting: Gastroenterology

## 2020-02-06 NOTE — Telephone Encounter (Signed)
Pt called to inquire if Dr. Silverio Decamp could approve for pt to take Apriso. Pt states that the place where she is moving to told her that she cannot taker it unless her doctor authorizes it. Pls call her at [phone].

## 2020-02-06 NOTE — Telephone Encounter (Signed)
Sure, yes she can have have fridge/freezer to support her nutritional status. Thanks

## 2020-02-06 NOTE — Telephone Encounter (Signed)
Dr Silverio Decamp The patient is having to move into an apartment complex soon. She was told she cannot bring her freezer. She uses her freezer to hold the supplies for her nutritional supplements, frozen fruits and veggies for the smoothies. That is what she has used for some time now to maintain her weight. She asks if we can support this by writing a letter explaining that the freezer is medically necessary. It is a small chest freezer which holds more that the apartment sized freezer/refrigerator combination.  and easy for her to access.  Thanks

## 2020-02-07 ENCOUNTER — Other Ambulatory Visit: Payer: Self-pay

## 2020-02-07 NOTE — Telephone Encounter (Signed)
Letter drafted. To provider for review.

## 2020-02-08 ENCOUNTER — Inpatient Hospital Stay: Payer: Medicare Other | Attending: Hematology

## 2020-02-08 ENCOUNTER — Other Ambulatory Visit: Payer: Self-pay

## 2020-02-08 VITALS — BP 132/65

## 2020-02-08 DIAGNOSIS — D518 Other vitamin B12 deficiency anemias: Secondary | ICD-10-CM | POA: Insufficient documentation

## 2020-02-08 DIAGNOSIS — Z9884 Bariatric surgery status: Secondary | ICD-10-CM | POA: Insufficient documentation

## 2020-02-08 DIAGNOSIS — D519 Vitamin B12 deficiency anemia, unspecified: Secondary | ICD-10-CM

## 2020-02-08 MED ORDER — CYANOCOBALAMIN 1000 MCG/ML IJ SOLN
1000.0000 ug | Freq: Once | INTRAMUSCULAR | Status: AC
  Administered 2020-02-08: 1000 ug via INTRAMUSCULAR

## 2020-02-08 MED ORDER — CYANOCOBALAMIN 1000 MCG/ML IJ SOLN
INTRAMUSCULAR | Status: AC
  Filled 2020-02-08: qty 1

## 2020-02-08 NOTE — Patient Instructions (Signed)
Cyanocobalamin, Pyridoxine, and Folate What is this medicine? A multivitamin containing folic acid, vitamin B6, and vitamin B12. This medicine may be used for other purposes; ask your health care provider or pharmacist if you have questions. COMMON BRAND NAME(S): AllanFol RX, AllanTex, Av-Vite FB, B Complex with Folic Acid, ComBgen, FaBB, Folamin, Folastin, Folbalin, Folbee, Folbic, Folcaps, Folgard, Folgard RX, Folgard RX 2.2, Folplex, Folplex 2.2, Foltabs 800, Foltx, Homocysteine Formula, Niva-Fol, NuFol, TL Gard RX, Virt-Gard, Virt-Vite, Virt-Vite Forte, Vita-Respa What should I tell my health care provider before I take this medicine? They need to know if you have any of these conditions:  bleeding or clotting disorder  history of anemia of any type  other chronic health condition  an unusual or allergic reaction to vitamins, other medicines, foods, dyes, or preservatives  pregnant or trying to get pregnant  breast-feeding How should I use this medicine? Take by mouth with a glass of water. May take with food. Follow the directions on the prescription label. It is usually given once a day. Do not take your medicine more often than directed. Contact your pediatrician regarding the use of this medicine in children. Special care may be needed. Overdosage: If you think you have taken too much of this medicine contact a poison control center or emergency room at once. NOTE: This medicine is only for you. Do not share this medicine with others. What if I miss a dose? If you miss a dose, take it as soon as you can. If it is almost time for your next dose, take only that dose. Do not take double or extra doses. What may interact with this medicine?  levodopa This list may not describe all possible interactions. Give your health care provider a list of all the medicines, herbs, non-prescription drugs, or dietary supplements you use. Also tell them if you smoke, drink alcohol, or use illegal  drugs. Some items may interact with your medicine. What should I watch for while using this medicine? See your health care professional for regular checks on your progress. Remember that vitamin supplements do not replace the need for good nutrition from a balanced diet. What side effects may I notice from receiving this medicine? Side effects that you should report to your doctor or health care professional as soon as possible:  allergic reaction such as skin rash or difficulty breathing  vomiting Side effects that usually do not require medical attention (report to your doctor or health care professional if they continue or are bothersome):  nausea  stomach upset This list may not describe all possible side effects. Call your doctor for medical advice about side effects. You may report side effects to FDA at 1-800-FDA-1088. Where should I keep my medicine? Keep out of the reach of children. Most vitamins should be stored at controlled room temperature. Check your specific product directions. Protect from heat and moisture. Throw away any unused medicine after the expiration date. NOTE: This sheet is a summary. It may not cover all possible information. If you have questions about this medicine, talk to your doctor, pharmacist, or health care provider.  2020 Elsevier/Gold Standard (2007-06-11 00:59:55)  

## 2020-02-22 ENCOUNTER — Other Ambulatory Visit: Payer: Self-pay

## 2020-02-22 ENCOUNTER — Inpatient Hospital Stay: Payer: Medicare Other

## 2020-02-22 VITALS — BP 139/69 | HR 89 | Resp 18

## 2020-02-22 DIAGNOSIS — D519 Vitamin B12 deficiency anemia, unspecified: Secondary | ICD-10-CM

## 2020-02-22 DIAGNOSIS — D518 Other vitamin B12 deficiency anemias: Secondary | ICD-10-CM | POA: Diagnosis not present

## 2020-02-22 MED ORDER — CYANOCOBALAMIN 1000 MCG/ML IJ SOLN
1000.0000 ug | Freq: Once | INTRAMUSCULAR | Status: AC
  Administered 2020-02-22: 1000 ug via INTRAMUSCULAR

## 2020-03-01 ENCOUNTER — Other Ambulatory Visit: Payer: Self-pay | Admitting: Podiatry

## 2020-03-01 ENCOUNTER — Telehealth: Payer: Self-pay | Admitting: Gastroenterology

## 2020-03-01 NOTE — Telephone Encounter (Signed)
Patient called to follow up on previous message  

## 2020-03-04 NOTE — Telephone Encounter (Signed)
Patient instructed.

## 2020-03-04 NOTE — Telephone Encounter (Signed)
Dulcolax or glycerine suppository to stimulate rectum followed by Miralax purge after 4 hours of suppository. Please advise patient to stay on Linzess 31mcg daily so she doesn't get episodes of constipation . Thank you

## 2020-03-04 NOTE — Telephone Encounter (Signed)
Patient tells me of abdominal pain she has had for the past 3 weeks. She is unable to get more than 2 hours relief from her pain using her usual methods including GI cocktail, Neurontin, and "patches." She vomited twice yesterday. She had not vomited prior to then and has not vomited since. She has not had a bowel movement since 2 days ago. She can feel the need to empty but no urge "it doesn't come on down, but I know it's there." She took her Linzess 72 mcg this morning which is her normal routine. She is trying to be careful with her diet eating mostly soft foods, but has had meats as well. She has a follow up appointment in 1 week.

## 2020-03-07 ENCOUNTER — Inpatient Hospital Stay: Payer: Medicare Other | Attending: Hematology

## 2020-03-07 ENCOUNTER — Other Ambulatory Visit: Payer: Self-pay

## 2020-03-07 DIAGNOSIS — Z9884 Bariatric surgery status: Secondary | ICD-10-CM | POA: Insufficient documentation

## 2020-03-07 DIAGNOSIS — D519 Vitamin B12 deficiency anemia, unspecified: Secondary | ICD-10-CM

## 2020-03-07 DIAGNOSIS — D518 Other vitamin B12 deficiency anemias: Secondary | ICD-10-CM | POA: Diagnosis not present

## 2020-03-07 DIAGNOSIS — Z79899 Other long term (current) drug therapy: Secondary | ICD-10-CM | POA: Insufficient documentation

## 2020-03-07 MED ORDER — CYANOCOBALAMIN 1000 MCG/ML IJ SOLN
INTRAMUSCULAR | Status: AC
  Filled 2020-03-07: qty 1

## 2020-03-07 MED ORDER — CYANOCOBALAMIN 1000 MCG/ML IJ SOLN
1000.0000 ug | Freq: Once | INTRAMUSCULAR | Status: AC
  Administered 2020-03-07: 1000 ug via INTRAMUSCULAR

## 2020-03-07 NOTE — Patient Instructions (Signed)
Cyanocobalamin, Pyridoxine, and Folate What is this medicine? A multivitamin containing folic acid, vitamin B6, and vitamin B12. This medicine may be used for other purposes; ask your health care provider or pharmacist if you have questions. COMMON BRAND NAME(S): AllanFol RX, AllanTex, Av-Vite FB, B Complex with Folic Acid, ComBgen, FaBB, Folamin, Folastin, Folbalin, Folbee, Folbic, Folcaps, Folgard, Folgard RX, Folgard RX 2.2, Folplex, Folplex 2.2, Foltabs 800, Foltx, Homocysteine Formula, Niva-Fol, NuFol, TL Gard RX, Virt-Gard, Virt-Vite, Virt-Vite Forte, Vita-Respa What should I tell my health care provider before I take this medicine? They need to know if you have any of these conditions:  bleeding or clotting disorder  history of anemia of any type  other chronic health condition  an unusual or allergic reaction to vitamins, other medicines, foods, dyes, or preservatives  pregnant or trying to get pregnant  breast-feeding How should I use this medicine? Take by mouth with a glass of water. May take with food. Follow the directions on the prescription label. It is usually given once a day. Do not take your medicine more often than directed. Contact your pediatrician regarding the use of this medicine in children. Special care may be needed. Overdosage: If you think you have taken too much of this medicine contact a poison control center or emergency room at once. NOTE: This medicine is only for you. Do not share this medicine with others. What if I miss a dose? If you miss a dose, take it as soon as you can. If it is almost time for your next dose, take only that dose. Do not take double or extra doses. What may interact with this medicine?  levodopa This list may not describe all possible interactions. Give your health care provider a list of all the medicines, herbs, non-prescription drugs, or dietary supplements you use. Also tell them if you smoke, drink alcohol, or use illegal  drugs. Some items may interact with your medicine. What should I watch for while using this medicine? See your health care professional for regular checks on your progress. Remember that vitamin supplements do not replace the need for good nutrition from a balanced diet. What side effects may I notice from receiving this medicine? Side effects that you should report to your doctor or health care professional as soon as possible:  allergic reaction such as skin rash or difficulty breathing  vomiting Side effects that usually do not require medical attention (report to your doctor or health care professional if they continue or are bothersome):  nausea  stomach upset This list may not describe all possible side effects. Call your doctor for medical advice about side effects. You may report side effects to FDA at 1-800-FDA-1088. Where should I keep my medicine? Keep out of the reach of children. Most vitamins should be stored at controlled room temperature. Check your specific product directions. Protect from heat and moisture. Throw away any unused medicine after the expiration date. NOTE: This sheet is a summary. It may not cover all possible information. If you have questions about this medicine, talk to your doctor, pharmacist, or health care provider.  2020 Elsevier/Gold Standard (2007-06-11 00:59:55)  

## 2020-03-11 ENCOUNTER — Encounter: Payer: Self-pay | Admitting: Physician Assistant

## 2020-03-11 ENCOUNTER — Ambulatory Visit (INDEPENDENT_AMBULATORY_CARE_PROVIDER_SITE_OTHER): Payer: Medicare Other | Admitting: Physician Assistant

## 2020-03-11 VITALS — BP 124/56 | HR 65 | Ht 61.0 in | Wt 112.0 lb

## 2020-03-11 DIAGNOSIS — K5900 Constipation, unspecified: Secondary | ICD-10-CM | POA: Diagnosis not present

## 2020-03-11 DIAGNOSIS — R109 Unspecified abdominal pain: Secondary | ICD-10-CM | POA: Diagnosis not present

## 2020-03-11 DIAGNOSIS — G8929 Other chronic pain: Secondary | ICD-10-CM | POA: Diagnosis not present

## 2020-03-11 MED ORDER — LINACLOTIDE 72 MCG PO CAPS
72.0000 ug | ORAL_CAPSULE | Freq: Every day | ORAL | 6 refills | Status: DC
Start: 2020-03-11 — End: 2020-07-22

## 2020-03-11 MED ORDER — GI COCKTAIL ~~LOC~~
ORAL | 6 refills | Status: DC
Start: 2020-03-11 — End: 2020-07-08

## 2020-03-11 MED ORDER — FAMOTIDINE 20 MG PO TABS
20.0000 mg | ORAL_TABLET | Freq: Two times a day (BID) | ORAL | 6 refills | Status: DC
Start: 2020-03-11 — End: 2021-05-14

## 2020-03-11 MED ORDER — GI COCKTAIL ~~LOC~~
ORAL | 6 refills | Status: DC
Start: 2020-03-11 — End: 2020-03-11

## 2020-03-11 MED ORDER — ESOMEPRAZOLE MAGNESIUM 40 MG PO CPDR
DELAYED_RELEASE_CAPSULE | ORAL | 1 refills | Status: DC
Start: 2020-03-11 — End: 2021-02-25

## 2020-03-11 NOTE — Patient Instructions (Addendum)
If you are age 79 or older, your body mass index should be between 23-30. Your Body mass index is 21.16 kg/m. If this is out of the aforementioned range listed, please consider follow up with your Primary Care Provider.  If you are age 59 or younger, your body mass index should be between 19-25. Your Body mass index is 21.16 kg/m. If this is out of the aformentioned range listed, please consider follow up with your Primary Care Provider.   We have sent refills of your medications to your pharmacy.   Use 17 grams daily in 8 ounces of water/juice.  Continue drinking Boost daily with your smoothie.  You have been scheduled to follow up with Dr. Silverio Decamp on May 28, 2019 at 1:30 pm.

## 2020-03-11 NOTE — Progress Notes (Signed)
Subjective:    Patient ID: Emily [name], female    DOB: Mar 25, 1941, 79 y.o.   MRN: [id number]  HPI [name] is a pleasant 79 year old African-[name], established with Dr. Rush Landmark who comes in today for routine follow-up.  She was last seen in the office in August 2021.  She has longstanding chronic abdominal pain felt secondary to multiple abdominal surgeries, adhesions.  She is status post failed Nissen fundoplication x2, gastropexy, subtotal gastrectomy with gastrojejunostomy and prior feeding tube.  She has chronic issues with dysmotility of the upper gut and small bowel and chronic constipation. She has been on Linzess 72 mcg daily for constipation and also uses MiraLAX.  She says she does not like to take both on the same day because then her bowel movements become unpredictable and sometimes she will have loose stools.  She is having bowel movements every couple of days with Linzess.  She has been using lidocaine patches and is also on gabapentin for her chronic abdominal pain.  She will also use GI cocktail periodically with flares of abdominal pain. She has been adding a boost supplement to her smoothies and drinking on a daily basis and weight is up about 2 pounds from last office visit. 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 She routinely receives B12 injections via oncology. Last colonoscopy 2014 - except sigmoid diverticulosis EGD October 2020 with a small diverticulum at the EG junction and patent esophagojejunal anastomosis.  Review of Systems Pertinent positive and negative review of systems were noted in the above HPI section.  All other review of systems was otherwise negative.  Outpatient Encounter Medications as of 03/11/2020  Medication Sig  . acetaminophen (TYLENOL) 500 MG tablet Take 500 mg by  mouth every 8 (eight) hours as needed (for pain.).  Marland Kitchen Alum & Mag Hydroxide-Simeth (GI COCKTAIL) SUSP suspension Shake well. taake 10 ML every 8 hours as needed  . ammonium lactate (AMLACTIN) 12 % cream APPLY EXTERNALLY TO THE AFFECTED AREA AS NEEDED FOR DRY SKIN  . brimonidine (ALPHAGAN) 0.2 % ophthalmic solution Place 1 drop into the left eye 2 (two) times daily.   . brimonidine (ALPHAGAN) 0.2 % ophthalmic solution Apply to eye.  . clotrimazole-betamethasone (LOTRISONE) cream Apply 1 application topically 2 (two) times daily as needed (rash).   . cyanocobalamin (,VITAMIN B-12,) 1000 MCG/ML injection Inject 1 mL (1,000 mcg total) into the muscle every 14 (fourteen) days.  Marland Kitchen esomeprazole (NEXIUM) 40 MG capsule TAKE 1 CAPSULE BY MOUTH TWICE DAILY BEFORE A MEAL  . famotidine (PEPCID) 20 MG tablet Take 1 tablet (20 mg total) by mouth 2 (two) times daily.  Marland Kitchen gabapentin (NEURONTIN) 300 MG capsule Take 300 mg by mouth 3 (three) times daily as needed (pain.).   Marland Kitchen hydrocortisone (ANUSOL-HC) 25 MG suppository Place 1 suppository (25 mg total) rectally every 12 (twelve) hours.  . hydrOXYzine (ATARAX/VISTARIL) 50 MG tablet Take 100 mg by mouth at bedtime.   Marland Kitchen latanoprost (XALATAN) 0.005 % ophthalmic solution Place 1 drop into the left eye at bedtime.  . lidocaine (LIDODERM) 5 % Remove & Discard patch within 12 hours or as directed by MD  . lidocaine (XYLOCAINE) 2 % solution   . linaclotide (LINZESS) 72 MCG capsule Take 1 capsule (72 mcg total) by mouth daily before breakfast.  . loperamide (IMODIUM) 2 MG capsule Take 2-4 mg by mouth 4 (  four) times daily as needed for diarrhea or loose stools.  Marland Kitchen losartan (COZAAR) 25 MG tablet Take 50 mg by mouth daily.   . mirtazapine (REMERON) 30 MG tablet TAKE 1 TABLET(30 MG) BY MOUTH AT BEDTIME  . montelukast (SINGULAIR) 10 MG tablet Take 10 mg by mouth daily.  Marland Kitchen oxybutynin (DITROPAN-XL) 5 MG 24 hr tablet Take 5 mg by mouth daily.  . polyethylene glycol (MIRALAX) 17 g  packet Take 17 g by mouth daily.  . pregabalin (LYRICA) 75 MG capsule   . promethazine (PHENERGAN) 12.5 MG tablet TAKE 1 TABLET(12.5 MG) BY MOUTH DAILY AS NEEDED FOR NAUSEA OR VOMITING  . RESTASIS 0.05 % ophthalmic emulsion Place 1 drop into both eyes 2 (two) times daily.  Marland Kitchen tiZANidine (ZANAFLEX) 4 MG tablet Take 4 mg by mouth 2 (two) times daily.  . Travoprost, BAK Free, (TRAVATAN) 0.004 % SOLN ophthalmic solution Place 1 drop into both eyes at bedtime.  . [DISCONTINUED] Alum & Mag Hydroxide-Simeth (GI COCKTAIL) SUSP suspension Shake well. taake 10 ML every 8 hours as needed  . [DISCONTINUED] Alum & Mag Hydroxide-Simeth (GI COCKTAIL) SUSP suspension Shake well. taake 10 ML every 8 hours as needed  . [DISCONTINUED] esomeprazole (NEXIUM) 40 MG capsule TAKE 1 CAPSULE BY MOUTH TWICE DAILY BEFORE A MEAL  . [DISCONTINUED] famotidine (PEPCID) 20 MG tablet Take 20 mg by mouth 2 (two) times daily.  . [DISCONTINUED] linaclotide (LINZESS) 72 MCG capsule Take 1 capsule (72 mcg total) by mouth daily before breakfast.  . [DISCONTINUED] traMADol HCl 100 MG TABS daily as needed.    No facility-administered encounter medications on file as of 03/11/2020.   No Known Allergies Patient Active Problem List   Diagnosis Date Noted  . Postural dizziness 07/05/2019  . 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 Pain due to onychomycosis of toenails of both feet 11/09/2018  . Dermatitis 11/09/2018  . Orthostatic syncope 05/24/2018  . History of alcohol use 05/24/2018  . Vitamin B12 deficiency 05/24/2018  . Polypharmacy 05/24/2018  . Syncope 05/23/2018  . B12 deficiency anemia 05/04/2018  . Unilateral primary osteoarthritis, right knee 11/24/2017  . Primary osteoarthritis of left knee 11/24/2017  . 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 Alcohol consumption binge drinking 06/19/2017  . GERD (gastroesophageal reflux disease) 06/19/2017  . Hypertension 06/19/2017  . History of Gastric outlet obstruction s/p resection 06/19/2017   . Chronic pain 06/19/2017  . Constipation 05/13/2017  . Pain in right lower leg 01/18/2017  . 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[place]) 09/18/2016  . Acute appendicitis 08/21/2016  . Chronic pain of left knee 06/01/2016  . Chronic pain of right knee 06/01/2016  . Cerebral infarction due to unspecified mechanism   . Nausea without vomiting 06/26/2014  . 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 S/P total gastrectomy and Roux-en-Y esophagojejunal anastomosis 2012 10/21/2012  . 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 Chronic abdominal pain 10/20/2012  . Diarrhea 10/20/2012  . CAP (community acquired pneumonia) 08/16/2012  . Dilation of biliary tract 08/16/2012  . Elevated LFTs 08/16/2012  . UTI (urinary tract infection) 08/13/2012  . Leukocytosis 08/12/2012  . Hyponatremia 08/12/2012  . Acute kidney failure ([place]) 08/12/2012  . Anemia 06/15/2012  . Dysphagia 07/30/2010  . HYPOKALEMIA 03/11/2010  . Nausea with vomiting 06/03/2009  . GI BLEED 05/18/2008  . Abdominal pain, chronic, epigastric 05/18/2008  . ANEMIA, IRON DEFICIENCY 01/24/2008  . Essential hypertension 10/07/2007  . DIVERTICULOSIS, COLON 10/07/2007  . Diabetes mellitus with coincident hypertension ([place]) 10/02/2006  .  DISORDER, DEPRESSIVE NEC 10/02/2006  . GERD 10/02/2006   Social History   Socioeconomic History  . Marital status: Single    Spouse name: Not on file  . Number of children: 1  . Years of education: 10 th  . Highest education level: Not on file  Occupational History  . Occupation: RETIRED    Employer: RETIRED  Tobacco Use  . Smoking status: Current Every Day Smoker    Packs/day: 0.50    Types: Cigarettes  . Smokeless tobacco: Never Used  . Tobacco comment: info given 12-18-14, off and on this last time about 5 years  Vaping Use  . Vaping Use: Never used  Substance and Sexual Activity  . Alcohol use: Not Currently    Comment: only on birthday  .  Drug use: No  . Sexual activity: Not on file  Other Topics Concern  . Not on file  Social History Narrative   05/25/19 lives alone, family stays sometimes   Social Determinants of Health   Financial Resource Strain:   . Difficulty of Paying Living Expenses: Not on file  Food Insecurity:   . Worried About Charity fundraiser in the Last Year: Not on file  . Ran Out of Food in the Last Year: Not on file  Transportation Needs:   . Lack of Transportation (Medical): Not on file  . Lack of Transportation (Non-Medical): Not on file  Physical Activity:   . Days of Exercise per Week: Not on file  . Minutes of Exercise per Session: Not on file  Stress:   . Feeling of Stress : Not on file  Social Connections:   . Frequency of Communication with Friends and Family: Not on file  . Frequency of Social Gatherings with Friends and Family: Not on file  . Attends Religious Services: Not on file  . Active Member of Clubs or Organizations: Not on file  . Attends Archivist Meetings: Not on file  . Marital Status: Not on file  Intimate Partner Violence:   . Fear of Current or Ex-Partner: Not on file  . Emotionally Abused: Not on file  . Physically Abused: Not on file  . Sexually Abused: Not on file    Ms. Bilger's family history includes Cancer in her paternal aunt; Heart failure in her father and mother.      Objective:    Vitals:   03/11/20 1350  BP: (!) 124/56  Pulse: 65    Physical Exam Well-developed well-nourished elderly African-[name] in no acute distress.  Ambulates with a walker height, Weight, 112 BMI 21.1  HEENT; nontraumatic normocephalic, EOMI, PE RR LA, sclera anicteric. Oropharynx; not done Neck; supple, no JVD Cardiovascular; regular rate and rhythm with S1-S2, no murmur rub or gallop Pulmonary; Clear bilaterally Abdomen; soft,  nondistended, she is tender across the mid abdomen, and left upper left mid quadrant no palpable mass or  hepatosplenomegaly, bowel sounds are active.  Multiple incisional scars Rectal; not done today Skin; benign exam, no jaundice rash or appreciable lesions Extremities; no clubbing cyanosis or edema skin warm and dry Neuro/Psych; alert and oriented x4, grossly nonfocal mood and affect appropriate       Assessment & Plan:   #52  79 year old African-[name] status post multiple abdominal surgeries including failed Nissen fundoplication x2, gastropexy, subtotal gastrectomy, appendectomy, and cholecystectomy with long history of chronic abdominal pain felt to be a combination of adhesive disease and abdominal wall pain. Stable on current regimen of lidocaine patches and  gabapentin. 2.  Chronic constipation-stable on Linzess 72 mcg daily and MiraLAX daily or every other day 3.  Chronic GERD stable on Nexium twice daily and famotidine twice daily 4.  Chronic gut dysmotility #5 weight loss multifactoral-weight has stabilized over the past 3 months, actually up 2 pounds   Plan; continue current regimen of Nexium 40 mg p.o. twice daily and famotidine 20 mg p.o. twice daily Continue Linzess 72 mcg p.o. daily She will continue MiraLAX every day or every other day as needed 17 g in 8 ounces of water Continue lidocaine patches daily to abdominal wall Continue gabapentin 300 mg 3 times daily Refill GI cocktail 10 cc every 6-8 hours as needed for nausea/abdominal pain Patient encouraged to continue boost supplements once or twice daily, okay to mix with smoothie. We will plan follow-up with Dr. Silverio Decamp in 3 to 4 months and will continue to monitor her weight.  Patient knows to call in interim for problems  Zonia Caplin Genia Harold PA-C 03/11/2020   Cc: Buzzy Han*

## 2020-03-21 ENCOUNTER — Inpatient Hospital Stay: Payer: Medicare Other

## 2020-03-21 ENCOUNTER — Other Ambulatory Visit: Payer: Self-pay

## 2020-03-21 VITALS — BP 115/66 | HR 72 | Resp 18

## 2020-03-21 DIAGNOSIS — D518 Other vitamin B12 deficiency anemias: Secondary | ICD-10-CM | POA: Diagnosis not present

## 2020-03-21 DIAGNOSIS — C7A8 Other malignant neuroendocrine tumors: Secondary | ICD-10-CM

## 2020-03-21 DIAGNOSIS — E538 Deficiency of other specified B group vitamins: Secondary | ICD-10-CM

## 2020-03-21 DIAGNOSIS — D519 Vitamin B12 deficiency anemia, unspecified: Secondary | ICD-10-CM

## 2020-03-21 DIAGNOSIS — D649 Anemia, unspecified: Secondary | ICD-10-CM

## 2020-03-21 LAB — COMPREHENSIVE METABOLIC PANEL
ALT: 14 U/L (ref 0–44)
AST: 22 U/L (ref 15–41)
Albumin: 3.5 g/dL (ref 3.5–5.0)
Alkaline Phosphatase: 94 U/L (ref 38–126)
Anion gap: 5 (ref 5–15)
BUN: 16 mg/dL (ref 8–23)
CO2: 26 mmol/L (ref 22–32)
Calcium: 8.6 mg/dL — ABNORMAL LOW (ref 8.9–10.3)
Chloride: 111 mmol/L (ref 98–111)
Creatinine, Ser: 0.81 mg/dL (ref 0.44–1.00)
GFR, Estimated: >60 mL/min (ref >60)
Glucose, Bld: 84 mg/dL (ref 70–99)
Potassium: 3.8 mmol/L (ref 3.5–5.1)
Sodium: 142 mmol/L (ref 135–145)
Total Bilirubin: 0.4 mg/dL (ref 0.3–1.2)
Total Protein: 6.6 g/dL (ref 6.5–8.1)

## 2020-03-21 LAB — CBC WITH DIFFERENTIAL/PLATELET
Abs Immature Granulocytes: 0.01 10*3/uL (ref 0.00–0.07)
Basophils Absolute: 0 10*3/uL (ref 0.0–0.1)
Basophils Relative: 1 %
Eosinophils Absolute: 0 10*3/uL (ref 0.0–0.5)
Eosinophils Relative: 1 %
HCT: 34.1 % — ABNORMAL LOW (ref 36.0–46.0)
Hemoglobin: 10.8 g/dL — ABNORMAL LOW (ref 12.0–15.0)
Immature Granulocytes: 0 %
Lymphocytes Relative: 35 %
Lymphs Abs: 2.2 10*3/uL (ref 0.7–4.0)
MCH: 24.2 pg — ABNORMAL LOW (ref 26.0–34.0)
MCHC: 31.7 g/dL (ref 30.0–36.0)
MCV: 76.5 fL — ABNORMAL LOW (ref 80.0–100.0)
Monocytes Absolute: 0.4 10*3/uL (ref 0.1–1.0)
Monocytes Relative: 6 %
Neutro Abs: 3.6 10*3/uL (ref 1.7–7.7)
Neutrophils Relative %: 57 %
Platelets: 229 10*3/uL (ref 150–400)
RBC: 4.46 MIL/uL (ref 3.87–5.11)
RDW: 14.8 % (ref 11.5–15.5)
WBC: 6.2 10*3/uL (ref 4.0–10.5)
nRBC: 0 % (ref 0.0–0.2)

## 2020-03-21 LAB — IRON AND TIBC
Iron: 79 ug/dL (ref 41–142)
Saturation Ratios: 26 % (ref 21–57)
TIBC: 298 ug/dL (ref 236–444)
UIBC: 219 ug/dL (ref 120–384)

## 2020-03-21 LAB — FERRITIN: Ferritin: 64 ng/mL (ref 11–307)

## 2020-03-21 LAB — VITAMIN B12: Vitamin B-12: 682 pg/mL (ref 180–914)

## 2020-03-21 MED ORDER — CYANOCOBALAMIN 1000 MCG/ML IJ SOLN
INTRAMUSCULAR | Status: AC
  Filled 2020-03-21: qty 1

## 2020-03-21 MED ORDER — CYANOCOBALAMIN 1000 MCG/ML IJ SOLN
1000.0000 ug | Freq: Once | INTRAMUSCULAR | Status: AC
  Administered 2020-03-21: 1000 ug via INTRAMUSCULAR

## 2020-03-22 LAB — CHROMOGRANIN A: Chromogranin A (ng/mL): 55.9 ng/mL (ref 0.0–101.8)

## 2020-03-23 LAB — METHYLMALONIC ACID, SERUM: Methylmalonic Acid, Quantitative: 145 nmol/L (ref 0–378)

## 2020-03-27 ENCOUNTER — Other Ambulatory Visit: Payer: Self-pay | Admitting: Podiatry

## 2020-03-27 NOTE — Telephone Encounter (Signed)
Please advise,last office visit 01/31/20,last refill date 03/01/20

## 2020-04-01 NOTE — Telephone Encounter (Signed)
Please advise on prescription

## 2020-04-02 ENCOUNTER — Telehealth: Payer: Self-pay | Admitting: Physician Assistant

## 2020-04-02 NOTE — Telephone Encounter (Signed)
Inbound call from patient stating is now having diarrhea and sharp abdominal pain.  Please call patient for advise.

## 2020-04-02 NOTE — Telephone Encounter (Signed)
Spoke with the patient.  She reports continuation of her abdominal pain. She felt very bad during the holiday and states she does not know what made her hurt so. She had to lie down for a while twice. She said she may have eaten too much. Presently she is back to her normal diet. She has had a bowel movement every day. Sometimes she will have a couple of bowel movements. Sometimes she has a little fecal incontinence, but not a large amount. This occurs without her knowing it is happening.  She uses her lidoderm patches and GI cocktail PRN. She can take Gabapentin but that makes her sleepy and she tries to avoid it. Patient is scheduled for a follow up appointment in January. She will continue her medications. Use Miralax PRN and hold it if she develops diarrhea. She is presently not taking it. Discussed the importance of not becoming constipated. Any other suggestions.

## 2020-04-03 NOTE — Telephone Encounter (Signed)
Agree with restarting MiraLAX and using it as needed.  Continue Lidoderm patches and GI cocktail as needed.  She can try to use gabapentin at bedtime so it does not affect her or increased somnolence during the daytime.  Thank you

## 2020-04-03 NOTE — Telephone Encounter (Signed)
Patient advised.

## 2020-04-04 ENCOUNTER — Inpatient Hospital Stay: Payer: Medicare Other | Attending: Hematology

## 2020-04-04 ENCOUNTER — Other Ambulatory Visit: Payer: Self-pay

## 2020-04-04 VITALS — BP 127/52 | HR 60 | Temp 98.0°F | Resp 18

## 2020-04-04 DIAGNOSIS — R42 Dizziness and giddiness: Secondary | ICD-10-CM | POA: Insufficient documentation

## 2020-04-04 DIAGNOSIS — K219 Gastro-esophageal reflux disease without esophagitis: Secondary | ICD-10-CM | POA: Insufficient documentation

## 2020-04-04 DIAGNOSIS — B518 Plasmodium vivax malaria with other complications: Secondary | ICD-10-CM | POA: Insufficient documentation

## 2020-04-04 DIAGNOSIS — I700 Atherosclerosis of aorta: Secondary | ICD-10-CM | POA: Diagnosis not present

## 2020-04-04 DIAGNOSIS — Z9884 Bariatric surgery status: Secondary | ICD-10-CM | POA: Diagnosis not present

## 2020-04-04 DIAGNOSIS — D509 Iron deficiency anemia, unspecified: Secondary | ICD-10-CM | POA: Diagnosis not present

## 2020-04-04 DIAGNOSIS — K5900 Constipation, unspecified: Secondary | ICD-10-CM | POA: Insufficient documentation

## 2020-04-04 DIAGNOSIS — R634 Abnormal weight loss: Secondary | ICD-10-CM | POA: Diagnosis not present

## 2020-04-04 DIAGNOSIS — R630 Anorexia: Secondary | ICD-10-CM | POA: Diagnosis not present

## 2020-04-04 DIAGNOSIS — D519 Vitamin B12 deficiency anemia, unspecified: Secondary | ICD-10-CM

## 2020-04-04 DIAGNOSIS — Z87442 Personal history of urinary calculi: Secondary | ICD-10-CM | POA: Diagnosis not present

## 2020-04-04 DIAGNOSIS — D518 Other vitamin B12 deficiency anemias: Secondary | ICD-10-CM | POA: Diagnosis present

## 2020-04-04 DIAGNOSIS — F17210 Nicotine dependence, cigarettes, uncomplicated: Secondary | ICD-10-CM | POA: Diagnosis not present

## 2020-04-04 DIAGNOSIS — C7A1 Malignant poorly differentiated neuroendocrine tumors: Secondary | ICD-10-CM | POA: Diagnosis not present

## 2020-04-04 DIAGNOSIS — Z79899 Other long term (current) drug therapy: Secondary | ICD-10-CM | POA: Insufficient documentation

## 2020-04-04 MED ORDER — CYANOCOBALAMIN 1000 MCG/ML IJ SOLN
INTRAMUSCULAR | Status: AC
  Filled 2020-04-04: qty 1

## 2020-04-04 MED ORDER — CYANOCOBALAMIN 1000 MCG/ML IJ SOLN
1000.0000 ug | Freq: Once | INTRAMUSCULAR | Status: AC
  Administered 2020-04-04: 1000 ug via INTRAMUSCULAR

## 2020-04-08 ENCOUNTER — Ambulatory Visit: Payer: Medicare Other | Admitting: Neurology

## 2020-04-09 NOTE — Progress Notes (Signed)
Reviewed and agree with documentation and assessment and plan. K. Veena Qiara Minetti , MD   

## 2020-04-11 ENCOUNTER — Encounter: Payer: Self-pay | Admitting: Neurology

## 2020-04-11 ENCOUNTER — Ambulatory Visit (INDEPENDENT_AMBULATORY_CARE_PROVIDER_SITE_OTHER): Payer: Medicare Other | Admitting: Neurology

## 2020-04-11 VITALS — BP 140/86 | HR 86 | Ht 62.0 in | Wt 106.0 lb

## 2020-04-11 DIAGNOSIS — G252 Other specified forms of tremor: Secondary | ICD-10-CM

## 2020-04-11 DIAGNOSIS — R42 Dizziness and giddiness: Secondary | ICD-10-CM
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
## 2020-04-18 ENCOUNTER — Inpatient Hospital Stay: Payer: Medicare Other

## 2020-04-18 ENCOUNTER — Inpatient Hospital Stay (HOSPITAL_BASED_OUTPATIENT_CLINIC_OR_DEPARTMENT_OTHER): Payer: Medicare Other | Admitting: Hematology

## 2020-04-18 ENCOUNTER — Encounter: Payer: Self-pay | Admitting: Hematology

## 2020-04-18 ENCOUNTER — Other Ambulatory Visit: Payer: Self-pay

## 2020-04-18 VITALS — BP 141/61 | HR 59 | Temp 97.0°F | Resp 16 | Ht 62.0 in | Wt 108.9 lb

## 2020-04-18 VITALS — BP 129/58 | HR 61 | Resp 18

## 2020-04-18 DIAGNOSIS — B518 Plasmodium vivax malaria with other complications: Secondary | ICD-10-CM | POA: Diagnosis not present

## 2020-04-18 DIAGNOSIS — D519 Vitamin B12 deficiency anemia, unspecified: Secondary | ICD-10-CM

## 2020-04-18 DIAGNOSIS — C7A8 Other malignant neuroendocrine tumors: Secondary | ICD-10-CM

## 2020-04-18 DIAGNOSIS — E538 Deficiency of other specified B group vitamins: Secondary | ICD-10-CM

## 2020-04-18 LAB — CBC WITH DIFFERENTIAL/PLATELET
Abs Immature Granulocytes: 0.01 10*3/uL (ref 0.00–0.07)
Basophils Absolute: 0 10*3/uL (ref 0.0–0.1)
Basophils Relative: 1 %
Eosinophils Absolute: 0 10*3/uL (ref 0.0–0.5)
Eosinophils Relative: 1 %
HCT: 29.8 % — ABNORMAL LOW (ref 36.0–46.0)
Hemoglobin: 9.6 g/dL — ABNORMAL LOW (ref 12.0–15.0)
Immature Granulocytes: 0 %
Lymphocytes Relative: 35 %
Lymphs Abs: 2.2 10*3/uL (ref 0.7–4.0)
MCH: 24.4 pg — ABNORMAL LOW (ref 26.0–34.0)
MCHC: 32.2 g/dL (ref 30.0–36.0)
MCV: 75.8 fL — ABNORMAL LOW (ref 80.0–100.0)
Monocytes Absolute: 0.5 10*3/uL (ref 0.1–1.0)
Monocytes Relative: 8 %
Neutro Abs: 3.5 10*3/uL (ref 1.7–7.7)
Neutrophils Relative %: 55 %
Platelets: 215 10*3/uL (ref 150–400)
RBC: 3.93 MIL/uL (ref 3.87–5.11)
RDW: 14.9 % (ref 11.5–15.5)
WBC: 6.2 10*3/uL (ref 4.0–10.5)
nRBC: 0 % (ref 0.0–0.2)

## 2020-04-18 MED ORDER — CYANOCOBALAMIN 1000 MCG/ML IJ SOLN
1000.0000 ug | Freq: Once | INTRAMUSCULAR | Status: AC
  Administered 2020-04-18: 1000 ug via INTRAMUSCULAR

## 2020-04-18 MED ORDER — CYANOCOBALAMIN 1000 MCG/ML IJ SOLN
INTRAMUSCULAR | Status: AC
  Filled 2020-04-18: qty 1

## 2020-04-18 NOTE — Patient Instructions (Signed)

## 2020-04-22 LAB — METHYLMALONIC ACID, SERUM: Methylmalonic Acid, Quantitative: 181 nmol/L (ref 0–378)

## 2020-04-25 ENCOUNTER — Ambulatory Visit: Payer: Medicare Other | Admitting: Neurology

## 2020-05-06 ENCOUNTER — Other Ambulatory Visit: Payer: Self-pay

## 2020-05-06 ENCOUNTER — Inpatient Hospital Stay: Payer: Medicare Other | Attending: Hematology

## 2020-05-06 VITALS — BP 158/63 | HR 66 | Temp 98.1°F | Resp 16

## 2020-05-06 DIAGNOSIS — D519 Vitamin B12 deficiency anemia, unspecified: Secondary | ICD-10-CM

## 2020-05-06 DIAGNOSIS — D509 Iron deficiency anemia, unspecified: Secondary | ICD-10-CM | POA: Diagnosis not present

## 2020-05-06 DIAGNOSIS — D518 Other vitamin B12 deficiency anemias: Secondary | ICD-10-CM | POA: Insufficient documentation

## 2020-05-06 MED ORDER — CYANOCOBALAMIN 1000 MCG/ML IJ SOLN
INTRAMUSCULAR | Status: AC
  Filled 2020-05-06: qty 1

## 2020-05-06 MED ORDER — CYANOCOBALAMIN 1000 MCG/ML IJ SOLN
1000.0000 ug | Freq: Once | INTRAMUSCULAR | Status: AC
  Administered 2020-05-06: 1000 ug via INTRAMUSCULAR

## 2020-05-06 MED ORDER — SODIUM CHLORIDE 0.9 % IV SOLN
Freq: Once | INTRAVENOUS | Status: AC
  Filled 2020-05-06: qty 250

## 2020-05-06 MED ORDER — SODIUM CHLORIDE 0.9 % IV SOLN
300.0000 mg | Freq: Once | INTRAVENOUS | Status: AC
  Administered 2020-05-06: 300 mg via INTRAVENOUS
  Filled 2020-05-06: qty 10

## 2020-05-06 NOTE — Progress Notes (Signed)
Pt. stable for discharge. Left via ambulation with walker. No respiratory distress noted.

## 2020-05-06 NOTE — Patient Instructions (Signed)
Iron Sucrose injection What is this medicine? IRON SUCROSE (AHY ern SOO krohs) is an iron complex. Iron is used to make healthy red blood cells, which carry oxygen and nutrients throughout the body. This medicine is used to treat iron deficiency anemia in people with chronic kidney disease. This medicine may be used for other purposes; ask your health care provider or pharmacist if you have questions. COMMON BRAND NAME(S): Venofer What should I tell my health care provider before I take this medicine? They need to know if you have any of these conditions:  anemia not caused by low iron levels  heart disease  high levels of iron in the blood  kidney disease  liver disease  an unusual or allergic reaction to iron, other medicines, foods, dyes, or preservatives  pregnant or trying to get pregnant  breast-feeding How should I use this medicine? This medicine is for infusion into a vein. It is given by a health care professional in a hospital or clinic setting. Talk to your pediatrician regarding the use of this medicine in children. While this drug may be prescribed for children as young as 2 years for selected conditions, precautions do apply. Overdosage: If you think you have taken too much of this medicine contact a poison control center or emergency room at once. NOTE: This medicine is only for you. Do not share this medicine with others. What if I miss a dose? It is important not to miss your dose. Call your doctor or health care professional if you are unable to keep an appointment. What may interact with this medicine? Do not take this medicine with any of the following medications:  deferoxamine  dimercaprol  other iron products This medicine may also interact with the following medications:  chloramphenicol  deferasirox This list may not describe all possible interactions. Give your health care provider a list of all the medicines, herbs, non-prescription drugs, or  dietary supplements you use. Also tell them if you smoke, drink alcohol, or use illegal drugs. Some items may interact with your medicine. What should I watch for while using this medicine? Visit your doctor or healthcare professional regularly. Tell your doctor or healthcare professional if your symptoms do not start to get better or if they get worse. You may need blood work done while you are taking this medicine. You may need to follow a special diet. Talk to your doctor. Foods that contain iron include: whole grains/cereals, dried fruits, beans, or peas, leafy green vegetables, and organ meats (liver, kidney). What side effects may I notice from receiving this medicine? Side effects that you should report to your doctor or health care professional as soon as possible:  allergic reactions like skin rash, itching or hives, swelling of the face, lips, or tongue  breathing problems  changes in blood pressure  cough  fast, irregular heartbeat  feeling faint or lightheaded, falls  fever or chills  flushing, sweating, or hot feelings  joint or muscle aches/pains  seizures  swelling of the ankles or feet  unusually weak or tired Side effects that usually do not require medical attention (report to your doctor or health care professional if they continue or are bothersome):  diarrhea  feeling achy  headache  irritation at site where injected  nausea, vomiting  stomach upset  tiredness This list may not describe all possible side effects. Call your doctor for medical advice about side effects. You may report side effects to FDA at 1-800-FDA-1088. Where should I keep   my medicine? This drug is given in a hospital or clinic and will not be stored at home. NOTE: This sheet is a summary. It may not cover all possible information. If you have questions about this medicine, talk to your doctor, pharmacist, or health care provider.  2020 Elsevier/Gold Standard (2011-01-29  17:14:35)   Cyanocobalamin, Vitamin B12 injection What is this medicine? CYANOCOBALAMIN (sye an oh koe BAL a min) is a man made form of vitamin B12. Vitamin B12 is used in the growth of healthy blood cells, nerve cells, and proteins in the body. It also helps with the metabolism of fats and carbohydrates. This medicine is used to treat people who can not absorb vitamin B12. This medicine may be used for other purposes; ask your health care provider or pharmacist if you have questions. COMMON BRAND NAME(S): B-12 Compliance Kit, B-12 Injection Kit, Cyomin, LA-12, Nutri-Twelve, Physicians EZ Use B-12, Primabalt What should I tell my health care provider before I take this medicine? They need to know if you have any of these conditions:  kidney disease  Leber's disease  megaloblastic anemia  an unusual or allergic reaction to cyanocobalamin, cobalt, other medicines, foods, dyes, or preservatives  pregnant or trying to get pregnant  breast-feeding How should I use this medicine? This medicine is injected into a muscle or deeply under the skin. It is usually given by a health care professional in a clinic or doctor's office. However, your doctor may teach you how to inject yourself. Follow all instructions. Talk to your pediatrician regarding the use of this medicine in children. Special care may be needed. Overdosage: If you think you have taken too much of this medicine contact a poison control center or emergency room at once. NOTE: This medicine is only for you. Do not share this medicine with others. What if I miss a dose? If you are given your dose at a clinic or doctor's office, call to reschedule your appointment. If you give your own injections and you miss a dose, take it as soon as you can. If it is almost time for your next dose, take only that dose. Do not take double or extra doses. What may interact with this medicine?  colchicine  heavy alcohol intake This list may not  describe all possible interactions. Give your health care provider a list of all the medicines, herbs, non-prescription drugs, or dietary supplements you use. Also tell them if you smoke, drink alcohol, or use illegal drugs. Some items may interact with your medicine. What should I watch for while using this medicine? Visit your doctor or health care professional regularly. You may need blood work done while you are taking this medicine. You may need to follow a special diet. Talk to your doctor. Limit your alcohol intake and avoid smoking to get the best benefit. What side effects may I notice from receiving this medicine? Side effects that you should report to your doctor or health care professional as soon as possible:  allergic reactions like skin rash, itching or hives, swelling of the face, lips, or tongue  blue tint to skin  chest tightness, pain  difficulty breathing, wheezing  dizziness  red, swollen painful area on the leg Side effects that usually do not require medical attention (report to your doctor or health care professional if they continue or are bothersome):  diarrhea  headache This list may not describe all possible side effects. Call your doctor for medical advice about side effects. You may report   side effects to FDA at 1-800-FDA-1088. Where should I keep my medicine? Keep out of the reach of children. Store at room temperature between 15 and 30 degrees C (59 and 85 degrees F). Protect from light. Throw away any unused medicine after the expiration date. NOTE: This sheet is a summary. It may not cover all possible information. If you have questions about this medicine, talk to your doctor, pharmacist, or health care provider.  2020 Elsevier/Gold Standard (2007-08-01 22:10:20)  

## 2020-05-07 ENCOUNTER — Ambulatory Visit (INDEPENDENT_AMBULATORY_CARE_PROVIDER_SITE_OTHER): Payer: Medicare Other | Admitting: Podiatry

## 2020-05-07 ENCOUNTER — Encounter: Payer: Self-pay | Admitting: Podiatry

## 2020-05-07 DIAGNOSIS — M79674 Pain in right toe(s): Secondary | ICD-10-CM

## 2020-05-07 DIAGNOSIS — M79675 Pain in left toe(s): Secondary | ICD-10-CM

## 2020-05-07 DIAGNOSIS — E0842 Diabetes mellitus due to underlying condition with diabetic polyneuropathy: Secondary | ICD-10-CM | POA: Diagnosis not present

## 2020-05-07 DIAGNOSIS — B351 Tinea unguium: Secondary | ICD-10-CM | POA: Diagnosis not present

## 2020-05-07 NOTE — Progress Notes (Signed)
This patient returns to my office for at risk foot care.  This patient requires this care by a professional since this patient will be at risk due to having  Diabetes.  This patient is unable to cut nails herself since the patient cannot reach her nails.These nails are painful walking and wearing shoes.  This patient presents for at risk foot care today.   General Appearance  Alert, conversant and in no acute stress.  Vascular  Dorsalis pedis and posterior tibial  pulses are palpable  bilaterally.  Capillary return is within normal limits  bilaterally. Temperature is within normal limits  bilaterally.  Neurologic  Senn-Weinstein monofilament wire test absent   bilaterally. Muscle power within normal limits bilaterally.  Nails Thick disfigured discolored nails with subungual debris  from hallux to fifth toes bilaterally. No evidence of bacterial infection or drainage bilaterally.  Orthopedic  No limitations of motion  feet .  No crepitus or effusions noted.  No bony pathology or digital deformities noted.  Skin  normotropic skin with no porokeratosis noted bilaterally.  No signs of infections or ulcers noted.   Peeling noted plantar right heel.  Onychomycosis  Pain in right toes  Pain in left toes  Consent was obtained for treatment procedures.   Mechanical debridement of nails 1-5  bilaterally performed with a nail nipper.  Filed with dremel without incident.  Discussed the peeling with this patient.  Since the amlactin is not working I recommended she try cortaid  1%.    Return office visit   3 months                   Told patient to return for periodic foot care and evaluation due to potential at risk complications.   Gardiner Barefoot DPM

## 2020-05-10 ENCOUNTER — Encounter: Payer: Self-pay | Admitting: Gastroenterology

## 2020-05-10 ENCOUNTER — Ambulatory Visit (INDEPENDENT_AMBULATORY_CARE_PROVIDER_SITE_OTHER): Payer: Medicare Other | Admitting: Gastroenterology

## 2020-05-10 VITALS — BP 126/72 | HR 66 | Ht 62.0 in | Wt 110.0 lb

## 2020-05-10 DIAGNOSIS — K581 Irritable bowel syndrome with constipation: Secondary | ICD-10-CM | POA: Diagnosis not present

## 2020-05-10 DIAGNOSIS — R1084 Generalized abdominal pain: Secondary | ICD-10-CM

## 2020-05-10 DIAGNOSIS — R159 Full incontinence of feces: Secondary | ICD-10-CM | POA: Diagnosis not present

## 2020-05-10 MED ORDER — HYDROCORTISONE (PERIANAL) 2.5 % EX CREA
1.0000 | TOPICAL_CREAM | Freq: Two times a day (BID) | CUTANEOUS | 1 refills | Status: DC
Start: 2020-05-10 — End: 2021-04-30
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
## 2020-05-10 NOTE — Patient Instructions (Signed)
We have sent in Anusol cream to your pharmacy  STOP suppositories   Eat small frequent meals with high calorie and high protein diet  Continue Linzess  Follow up in 3 months   Due to recent changes in healthcare laws, you may see the results of your imaging and laboratory studies on MyChart before your provider has had a chance to review them.  We understand that in some cases there may be results that are confusing or concerning to you. Not all laboratory results come back in the same time frame and the provider may be waiting for multiple results in order to interpret others.  Please give [place] 48 hours in order for your provider to thoroughly review all the results before contacting the office for clarification of your results.   I appreciate the  opportunity to care for you  Thank You   Harl Bowie , MD

## 2020-05-20 ENCOUNTER — Inpatient Hospital Stay: Payer: Medicare Other

## 2020-05-22 ENCOUNTER — Other Ambulatory Visit: Payer: Self-pay | Admitting: Gastroenterology

## 2020-05-24 ENCOUNTER — Encounter: Payer: Self-pay | Admitting: Gastroenterology

## 2020-05-24 ENCOUNTER — Telehealth: Payer: Self-pay | Admitting: Hematology

## 2020-05-24 NOTE — Telephone Encounter (Signed)
R/s appt per 1/17 sch msg - pt is aware of appt date and time

## 2020-05-27 ENCOUNTER — Ambulatory Visit: Payer: Medicare Other | Admitting: Gastroenterology

## 2020-05-28 ENCOUNTER — Other Ambulatory Visit: Payer: Medicare Other

## 2020-05-28 ENCOUNTER — Ambulatory Visit: Payer: Medicare Other

## 2020-05-31 ENCOUNTER — Inpatient Hospital Stay: Payer: Medicare Other

## 2020-05-31 ENCOUNTER — Other Ambulatory Visit: Payer: Self-pay

## 2020-05-31 DIAGNOSIS — D519 Vitamin B12 deficiency anemia, unspecified: Secondary | ICD-10-CM

## 2020-05-31 DIAGNOSIS — D509 Iron deficiency anemia, unspecified: Secondary | ICD-10-CM | POA: Diagnosis not present

## 2020-05-31 DIAGNOSIS — C7A8 Other malignant neuroendocrine tumors: Secondary | ICD-10-CM

## 2020-05-31 LAB — CBC WITH DIFFERENTIAL/PLATELET
Abs Immature Granulocytes: 0.01 10*3/uL (ref 0.00–0.07)
Basophils Absolute: 0 10*3/uL (ref 0.0–0.1)
Basophils Relative: 0 %
Eosinophils Absolute: 0 10*3/uL (ref 0.0–0.5)
Eosinophils Relative: 1 %
HCT: 38.2 % (ref 36.0–46.0)
Hemoglobin: 12 g/dL (ref 12.0–15.0)
Immature Granulocytes: 0 %
Lymphocytes Relative: 39 %
Lymphs Abs: 2 10*3/uL (ref 0.7–4.0)
MCH: 24.2 pg — ABNORMAL LOW (ref 26.0–34.0)
MCHC: 31.4 g/dL (ref 30.0–36.0)
MCV: 77.2 fL — ABNORMAL LOW (ref 80.0–100.0)
Monocytes Absolute: 0.3 10*3/uL (ref 0.1–1.0)
Monocytes Relative: 6 %
Neutro Abs: 2.8 10*3/uL (ref 1.7–7.7)
Neutrophils Relative %: 54 %
Platelets: 181 10*3/uL (ref 150–400)
RBC: 4.95 MIL/uL (ref 3.87–5.11)
RDW: 15.2 % (ref 11.5–15.5)
WBC: 5.3 10*3/uL (ref 4.0–10.5)
nRBC: 0 % (ref 0.0–0.2)

## 2020-05-31 LAB — COMPREHENSIVE METABOLIC PANEL
ALT: 21 U/L (ref 0–44)
AST: 32 U/L (ref 15–41)
Albumin: 3.4 g/dL — ABNORMAL LOW (ref 3.5–5.0)
Alkaline Phosphatase: 91 U/L (ref 38–126)
Anion gap: 7 (ref 5–15)
BUN: 14 mg/dL (ref 8–23)
CO2: 22 mmol/L (ref 22–32)
Calcium: 8.3 mg/dL — ABNORMAL LOW (ref 8.9–10.3)
Chloride: 114 mmol/L — ABNORMAL HIGH (ref 98–111)
Creatinine, Ser: 0.74 mg/dL (ref 0.44–1.00)
GFR, Estimated: >60 mL/min (ref >60)
Glucose, Bld: 84 mg/dL (ref 70–99)
Potassium: 4 mmol/L (ref 3.5–5.1)
Sodium: 143 mmol/L (ref 135–145)
Total Bilirubin: 0.2 mg/dL — ABNORMAL LOW (ref 0.3–1.2)
Total Protein: 6.2 g/dL — ABNORMAL LOW (ref 6.5–8.1)

## 2020-05-31 MED ORDER — CYANOCOBALAMIN 1000 MCG/ML IJ SOLN
INTRAMUSCULAR | Status: AC
  Filled 2020-05-31: qty 1

## 2020-05-31 MED ORDER — CYANOCOBALAMIN 1000 MCG/ML IJ SOLN
1000.0000 ug | Freq: Once | INTRAMUSCULAR | Status: AC
  Administered 2020-05-31: 1000 ug via INTRAMUSCULAR

## 2020-05-31 NOTE — Progress Notes (Signed)
Pt states she cannot stay for her iron treatment because she will not be finished by 5pm. She received her B12 injection and chose to reschedule her iron injection for an earlier time because of transportation issues.

## 2020-06-01 ENCOUNTER — Other Ambulatory Visit: Payer: Self-pay | Admitting: Hematology

## 2020-06-03 ENCOUNTER — Telehealth: Payer: Self-pay | Admitting: *Deleted

## 2020-06-03 ENCOUNTER — Inpatient Hospital Stay: Payer: Medicare Other

## 2020-06-03 NOTE — Telephone Encounter (Signed)
Called pt to inform no iron infusion 2/4 & cancelled appt.  She will keep B-12 appts

## 2020-06-03 NOTE — Telephone Encounter (Signed)
Called pt with Dr Ernestina Penna message.  She did not get her iron infusion last week d/t time constraints & is supposed to be r/s.  Message to Dr Burr Medico to see if she still needs to get iron sucrose.

## 2020-06-03 NOTE — Telephone Encounter (Signed)
-----   Message from Truitt Merle, MD sent at 06/01/2020 12:55 PM EST ----- Please let pt know her anemia resolved now, continue monitoring. Thanks   Truitt Merle  06/01/2020

## 2020-06-03 NOTE — Telephone Encounter (Signed)
OK to cancel her 2/4 iron infusion. Will continue monitoring   Truitt Merle MD

## 2020-06-07 ENCOUNTER — Inpatient Hospital Stay: Payer: Medicare Other

## 2020-06-11 ENCOUNTER — Telehealth: Payer: Self-pay

## 2020-06-11 NOTE — Telephone Encounter (Signed)
Spoke with patient's daughter attempting to schedule Palliative care consult. Daughter requested to call me back once she got home.

## 2020-06-17 ENCOUNTER — Inpatient Hospital Stay: Payer: Medicare Other | Attending: Hematology

## 2020-06-17 ENCOUNTER — Inpatient Hospital Stay: Payer: Medicare Other

## 2020-06-17 ENCOUNTER — Other Ambulatory Visit: Payer: Self-pay | Admitting: Family

## 2020-06-17 ENCOUNTER — Other Ambulatory Visit: Payer: Self-pay

## 2020-06-17 VITALS — BP 137/68 | HR 112 | Temp 98.5°F | Resp 16

## 2020-06-17 DIAGNOSIS — D518 Other vitamin B12 deficiency anemias: Secondary | ICD-10-CM | POA: Insufficient documentation

## 2020-06-17 DIAGNOSIS — C7A8 Other malignant neuroendocrine tumors: Secondary | ICD-10-CM

## 2020-06-17 DIAGNOSIS — D519 Vitamin B12 deficiency anemia, unspecified: Secondary | ICD-10-CM

## 2020-06-17 DIAGNOSIS — D649 Anemia, unspecified: Secondary | ICD-10-CM

## 2020-06-17 DIAGNOSIS — Z1231 Encounter for screening mammogram for malignant neoplasm of breast: Secondary | ICD-10-CM

## 2020-06-17 DIAGNOSIS — D509 Iron deficiency anemia, unspecified: Secondary | ICD-10-CM | POA: Diagnosis not present

## 2020-06-17 DIAGNOSIS — E538 Deficiency of other specified B group vitamins: Secondary | ICD-10-CM

## 2020-06-17 LAB — CBC WITH DIFFERENTIAL/PLATELET
Abs Immature Granulocytes: 0.01 10*3/uL (ref 0.00–0.07)
Basophils Absolute: 0 10*3/uL (ref 0.0–0.1)
Basophils Relative: 0 %
Eosinophils Absolute: 0 10*3/uL (ref 0.0–0.5)
Eosinophils Relative: 1 %
HCT: 35.2 % — ABNORMAL LOW (ref 36.0–46.0)
Hemoglobin: 11.1 g/dL — ABNORMAL LOW (ref 12.0–15.0)
Immature Granulocytes: 0 %
Lymphocytes Relative: 37 %
Lymphs Abs: 2.2 10*3/uL (ref 0.7–4.0)
MCH: 24.1 pg — ABNORMAL LOW (ref 26.0–34.0)
MCHC: 31.5 g/dL (ref 30.0–36.0)
MCV: 76.4 fL — ABNORMAL LOW (ref 80.0–100.0)
Monocytes Absolute: 0.3 10*3/uL (ref 0.1–1.0)
Monocytes Relative: 5 %
Neutro Abs: 3.4 10*3/uL (ref 1.7–7.7)
Neutrophils Relative %: 57 %
Platelets: 199 10*3/uL (ref 150–400)
RBC: 4.61 MIL/uL (ref 3.87–5.11)
RDW: 15 % (ref 11.5–15.5)
WBC: 6 10*3/uL (ref 4.0–10.5)
nRBC: 0 % (ref 0.0–0.2)

## 2020-06-17 LAB — IRON AND TIBC
Iron: 115 ug/dL (ref 41–142)
Saturation Ratios: 44 % (ref 21–57)
TIBC: 260 ug/dL (ref 236–444)
UIBC: 145 ug/dL (ref 120–384)

## 2020-06-17 LAB — VITAMIN B12: Vitamin B-12: 482 pg/mL (ref 180–914)

## 2020-06-17 LAB — FERRITIN: Ferritin: 128 ng/mL (ref 11–307)

## 2020-06-17 MED ORDER — CYANOCOBALAMIN 1000 MCG/ML IJ SOLN
1000.0000 ug | Freq: Once | INTRAMUSCULAR | Status: AC
  Administered 2020-06-17: 1000 ug via INTRAMUSCULAR

## 2020-06-17 MED ORDER — CYANOCOBALAMIN 1000 MCG/ML IJ SOLN
INTRAMUSCULAR | Status: AC
  Filled 2020-06-17: qty 1

## 2020-06-17 NOTE — Patient Instructions (Addendum)

## 2020-06-18 ENCOUNTER — Telehealth: Payer: Self-pay

## 2020-06-18 NOTE — Telephone Encounter (Signed)
Spoke with patient's daughter [name] and scheduled an in-person Palliative Consult for 07/10/20 @ 12:30PM  COVID screening was negative. One cat in the home.   Consent obtained; updated Outlook/Netsmart/Team List and Epic.  Family is aware they will be receiving a call from NP the day before or day of to confirm appointment.

## 2020-06-19 ENCOUNTER — Telehealth: Payer: Self-pay | Admitting: Neurology

## 2020-06-19 NOTE — Telephone Encounter (Signed)
The patient was seen at [facility] for movement disorders evaluation.  He was felt that she had a functional tremor.

## 2020-06-20 ENCOUNTER — Telehealth: Payer: Self-pay

## 2020-06-20 LAB — CHROMOGRANIN A: Chromogranin A (ng/mL): 51.8 ng/mL (ref 0.0–101.8)

## 2020-06-20 NOTE — Telephone Encounter (Signed)
Spoke with Emily [name] made her aware of most recent lab results Dr. Burr Medico plan to continue B12 injections and follow up as scheduled encouraged to call for questions concerns or changes

## 2020-06-20 NOTE — Telephone Encounter (Signed)
-----   Message from Truitt Merle, MD sent at 06/20/2020  8:10 AM EST ----- Please let pt know her lab results, overall good, mild anemia, will continue B12 injections and lab monitoring.   Truitt Merle  06/20/2020

## 2020-06-23 LAB — METHYLMALONIC ACID, SERUM: Methylmalonic Acid, Quantitative: 204 nmol/L (ref 0–378)

## 2020-07-01 ENCOUNTER — Other Ambulatory Visit: Payer: Self-pay

## 2020-07-01 ENCOUNTER — Inpatient Hospital Stay: Payer: Medicare Other

## 2020-07-01 VITALS — BP 133/63 | HR 71 | Resp 18

## 2020-07-01 DIAGNOSIS — D509 Iron deficiency anemia, unspecified: Secondary | ICD-10-CM | POA: Diagnosis not present

## 2020-07-01 DIAGNOSIS — D519 Vitamin B12 deficiency anemia, unspecified: Secondary | ICD-10-CM

## 2020-07-01 MED ORDER — CYANOCOBALAMIN 1000 MCG/ML IJ SOLN
INTRAMUSCULAR | Status: AC
  Filled 2020-07-01: qty 1

## 2020-07-01 MED ORDER — CYANOCOBALAMIN 1000 MCG/ML IJ SOLN
1000.0000 ug | Freq: Once | INTRAMUSCULAR | Status: AC
  Administered 2020-07-01: 1000 ug via INTRAMUSCULAR

## 2020-07-08 ENCOUNTER — Other Ambulatory Visit: Payer: Self-pay

## 2020-07-08 ENCOUNTER — Telehealth: Payer: Self-pay | Admitting: Gastroenterology

## 2020-07-08 MED ORDER — LIDOCAINE VISCOUS HCL 2 % MT SOLN: 240.0000 mL | Freq: Three times a day (TID) | OROMUCOSAL | 5 refills | Status: DC | PRN

## 2020-07-08 NOTE — Telephone Encounter (Signed)
Called patient back and she states she is having Daily lower abdominal pain . States the Lido patches do help, but she can only use them for 12 hours a day and when it comes off, the pain is right back. States the GI cocktail does help some with her stomach pain. Looking for any further advise.

## 2020-07-08 NOTE — Telephone Encounter (Signed)
Please advise patient to alternate the GI cocktail and lidocaine patches. Please schedule follow up office visit . Thanks

## 2020-07-08 NOTE — Telephone Encounter (Signed)
Called patient and gave Dr. Woodward Ku recommendations and scheduled an office visit for 07/22/20

## 2020-07-09 ENCOUNTER — Other Ambulatory Visit: Payer: Self-pay

## 2020-07-09 ENCOUNTER — Telehealth: Payer: Self-pay | Admitting: Hematology

## 2020-07-09 ENCOUNTER — Telehealth: Payer: Self-pay

## 2020-07-09 NOTE — Telephone Encounter (Signed)
[facility] out of [facility] in the [place] area. Called verbal order into [facility] for GI Cocktail: 22ml-2% Viscous Lidocaine, 23ml-Dicyclomine, 275ml-Maalox 400mg . = 439ml with 2 refills. Called patient and let her know [facility] would call her when it was ready.

## 2020-07-09 NOTE — Telephone Encounter (Signed)
Rescheduled appt per MD schedule change. Pt is aware of appts moved from 3/16 to 3/18

## 2020-07-10 ENCOUNTER — Other Ambulatory Visit: Payer: Medicare Other | Admitting: Hospice

## 2020-07-10 ENCOUNTER — Other Ambulatory Visit: Payer: Self-pay

## 2020-07-10 DIAGNOSIS — C7A8 Other malignant neuroendocrine tumors: Secondary | ICD-10-CM

## 2020-07-10 DIAGNOSIS — R269 Unspecified abnormalities of gait and mobility: Secondary | ICD-10-CM

## 2020-07-10 DIAGNOSIS — Z515 Encounter for palliative care: Secondary | ICD-10-CM
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
## 2020-07-15 ENCOUNTER — Ambulatory Visit (HOSPITAL_COMMUNITY)
Admission: RE | Admit: 2020-07-15 | Discharge: 2020-07-15 | Disposition: A | Discharge status: Home / Self Care | Payer: Medicare Other | Source: Ambulatory Visit | Attending: Hematology | Admitting: Hematology

## 2020-07-15 ENCOUNTER — Other Ambulatory Visit: Payer: Self-pay

## 2020-07-15 ENCOUNTER — Encounter (HOSPITAL_COMMUNITY): Payer: Self-pay

## 2020-07-15 DIAGNOSIS — C7A8 Other malignant neuroendocrine tumors: Secondary | ICD-10-CM | POA: Insufficient documentation

## 2020-07-15 LAB — POCT I-STAT CREATININE: Creatinine, Ser: 0.8 mg/dL (ref 0.44–1.00)

## 2020-07-15 MED ORDER — IOHEXOL 300 MG/ML  SOLN
75.0000 mL | Freq: Once | INTRAMUSCULAR | Status: AC | PRN
  Administered 2020-07-15: 75 mL via INTRAVENOUS

## 2020-07-15 MED ORDER — IOHEXOL 300 MG/ML  SOLN: 100.0000 mL | Freq: Once | INTRAMUSCULAR | Status: DC | PRN

## 2020-07-17 ENCOUNTER — Ambulatory Visit: Payer: Medicare Other

## 2020-07-17 ENCOUNTER — Other Ambulatory Visit: Payer: Medicare Other

## 2020-07-17 ENCOUNTER — Ambulatory Visit: Payer: Medicare Other | Admitting: Hematology
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
## 2020-07-19 ENCOUNTER — Other Ambulatory Visit: Payer: Self-pay

## 2020-07-19 ENCOUNTER — Inpatient Hospital Stay (HOSPITAL_BASED_OUTPATIENT_CLINIC_OR_DEPARTMENT_OTHER): Payer: Medicare Other | Admitting: Hematology

## 2020-07-19 ENCOUNTER — Inpatient Hospital Stay: Payer: Medicare Other | Attending: Hematology

## 2020-07-19 ENCOUNTER — Inpatient Hospital Stay: Payer: Medicare Other

## 2020-07-19 ENCOUNTER — Encounter: Payer: Self-pay | Admitting: Hematology

## 2020-07-19 VITALS — BP 148/63 | HR 69 | Temp 97.6°F | Resp 13 | Ht 62.0 in | Wt 108.5 lb

## 2020-07-19 DIAGNOSIS — Z9049 Acquired absence of other specified parts of digestive tract: Secondary | ICD-10-CM | POA: Diagnosis not present

## 2020-07-19 DIAGNOSIS — C7A8 Other malignant neuroendocrine tumors: Secondary | ICD-10-CM | POA: Diagnosis not present

## 2020-07-19 DIAGNOSIS — I6782 Cerebral ischemia: Secondary | ICD-10-CM | POA: Diagnosis not present

## 2020-07-19 DIAGNOSIS — Z9884 Bariatric surgery status: Secondary | ICD-10-CM | POA: Insufficient documentation

## 2020-07-19 DIAGNOSIS — I700 Atherosclerosis of aorta: Secondary | ICD-10-CM | POA: Diagnosis not present

## 2020-07-19 DIAGNOSIS — E538 Deficiency of other specified B group vitamins: Secondary | ICD-10-CM | POA: Insufficient documentation

## 2020-07-19 DIAGNOSIS — R296 Repeated falls: Secondary | ICD-10-CM | POA: Insufficient documentation

## 2020-07-19 DIAGNOSIS — D519 Vitamin B12 deficiency anemia, unspecified: Secondary | ICD-10-CM

## 2020-07-19 DIAGNOSIS — Z87891 Personal history of nicotine dependence: Secondary | ICD-10-CM | POA: Insufficient documentation

## 2020-07-19 DIAGNOSIS — R102 Pelvic and perineal pain: Secondary | ICD-10-CM | POA: Insufficient documentation

## 2020-07-19 DIAGNOSIS — N281 Cyst of kidney, acquired: Secondary | ICD-10-CM | POA: Insufficient documentation

## 2020-07-19 DIAGNOSIS — K219 Gastro-esophageal reflux disease without esophagitis: Secondary | ICD-10-CM | POA: Diagnosis not present

## 2020-07-19 DIAGNOSIS — Z79899 Other long term (current) drug therapy: Secondary | ICD-10-CM | POA: Diagnosis not present

## 2020-07-19 DIAGNOSIS — D509 Iron deficiency anemia, unspecified: Secondary | ICD-10-CM | POA: Diagnosis present

## 2020-07-19 DIAGNOSIS — N200 Calculus of kidney: Secondary | ICD-10-CM | POA: Diagnosis not present

## 2020-07-19 DIAGNOSIS — K589 Irritable bowel syndrome without diarrhea: Secondary | ICD-10-CM | POA: Diagnosis not present

## 2020-07-19 LAB — CBC WITH DIFFERENTIAL/PLATELET
Abs Immature Granulocytes: 0.01 10*3/uL (ref 0.00–0.07)
Basophils Absolute: 0 10*3/uL (ref 0.0–0.1)
Basophils Relative: 0 %
Eosinophils Absolute: 0 10*3/uL (ref 0.0–0.5)
Eosinophils Relative: 0 %
HCT: 33.6 % — ABNORMAL LOW (ref 36.0–46.0)
Hemoglobin: 10.6 g/dL — ABNORMAL LOW (ref 12.0–15.0)
Immature Granulocytes: 0 %
Lymphocytes Relative: 35 %
Lymphs Abs: 2 10*3/uL (ref 0.7–4.0)
MCH: 24.4 pg — ABNORMAL LOW (ref 26.0–34.0)
MCHC: 31.5 g/dL (ref 30.0–36.0)
MCV: 77.2 fL — ABNORMAL LOW (ref 80.0–100.0)
Monocytes Absolute: 0.3 10*3/uL (ref 0.1–1.0)
Monocytes Relative: 5 %
Neutro Abs: 3.4 10*3/uL (ref 1.7–7.7)
Neutrophils Relative %: 60 %
Platelets: 179 10*3/uL (ref 150–400)
RBC: 4.35 MIL/uL (ref 3.87–5.11)
RDW: 15 % (ref 11.5–15.5)
WBC: 5.7 10*3/uL (ref 4.0–10.5)
nRBC: 0 % (ref 0.0–0.2)

## 2020-07-19 MED ORDER — CYANOCOBALAMIN 1000 MCG/ML IJ SOLN
1000.0000 ug | Freq: Once | INTRAMUSCULAR | Status: AC
  Administered 2020-07-19: 1000 ug via INTRAMUSCULAR

## 2020-07-19 MED ORDER — CYANOCOBALAMIN 1000 MCG/ML IJ SOLN
INTRAMUSCULAR | Status: AC
  Filled 2020-07-19: qty 1

## 2020-07-19 NOTE — Patient Instructions (Signed)

## 2020-07-22 ENCOUNTER — Other Ambulatory Visit: Payer: Self-pay

## 2020-07-22 ENCOUNTER — Ambulatory Visit (INDEPENDENT_AMBULATORY_CARE_PROVIDER_SITE_OTHER): Payer: Medicare Other | Admitting: Gastroenterology

## 2020-07-22 ENCOUNTER — Encounter: Payer: Self-pay | Admitting: Gastroenterology

## 2020-07-22 ENCOUNTER — Telehealth: Payer: Self-pay | Admitting: Hematology

## 2020-07-22 VITALS — BP 150/70 | HR 64 | Ht 61.5 in | Wt 108.1 lb

## 2020-07-22 DIAGNOSIS — K5904 Chronic idiopathic constipation: Secondary | ICD-10-CM

## 2020-07-22 DIAGNOSIS — R109 Unspecified abdominal pain: Secondary | ICD-10-CM | POA: Diagnosis not present

## 2020-07-22 DIAGNOSIS — G8929 Other chronic pain: Secondary | ICD-10-CM | POA: Diagnosis not present

## 2020-07-22 DIAGNOSIS — K219 Gastro-esophageal reflux disease without esophagitis: Secondary | ICD-10-CM

## 2020-07-22 LAB — METHYLMALONIC ACID, SERUM: Methylmalonic Acid, Quantitative: 234 nmol/L (ref 0–378)

## 2020-07-22 MED ORDER — AMBULATORY NON FORMULARY MEDICATION: 0 refills | Status: DC

## 2020-07-22 MED ORDER — LINACLOTIDE 72 MCG PO CAPS: 72.0000 ug | ORAL_CAPSULE | Freq: Every day | ORAL | 6 refills | Status: DC
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
## 2020-07-22 NOTE — Patient Instructions (Signed)
We have written out a prescription for 1 hospital bed for you to submit to  A medical supply facility and they should coordinate with your insurance   We will refill your GI cocktail for you  We also will refill your Linzess for you  Eat small frequent meals  Due to recent changes in healthcare laws, you may see the results of your imaging and laboratory studies on MyChart before your provider has had a chance to review them.  We understand that in some cases there may be results that are confusing or concerning to you. Not all laboratory results come back in the same time frame and the provider may be waiting for multiple results in order to interpret others.  Please give [place] 48 hours in order for your provider to thoroughly review all the results before contacting the office for clarification of your results.   I appreciate the  opportunity to care for you  Thank You   Harl Bowie , MD

## 2020-07-22 NOTE — Telephone Encounter (Signed)
Scheduled per 3/18 los. Called and spoke with pt, confirmed 4/1 appt

## 2020-07-24 ENCOUNTER — Telehealth: Payer: Self-pay | Admitting: Gastroenterology

## 2020-07-24 ENCOUNTER — Encounter: Payer: Self-pay | Admitting: *Deleted

## 2020-07-24 ENCOUNTER — Encounter: Payer: Self-pay | Admitting: Gastroenterology

## 2020-07-24 MED ORDER — AMBULATORY NON FORMULARY MEDICATION: 0 refills | Status: DC

## 2020-07-24 NOTE — Telephone Encounter (Signed)
Spoke with [facility], Im faxing prescription for bed dx code Office notes to [phone] today. This is required for them to try to get her a bed through her insurance   Called patient and spoke with her, explained to her the information [facility] needed and info was faxed today they will reach out to the patient

## 2020-07-29 ENCOUNTER — Telehealth: Payer: Self-pay | Admitting: Gastroenterology

## 2020-07-29 DIAGNOSIS — R109 Unspecified abdominal pain: Secondary | ICD-10-CM

## 2020-07-29 DIAGNOSIS — G8929 Other chronic pain: Secondary | ICD-10-CM

## 2020-07-29 MED ORDER — LINACLOTIDE 145 MCG PO CAPS: 145.0000 ug | ORAL_CAPSULE | Freq: Every day | ORAL | 3 refills | Status: DC

## 2020-07-29 NOTE — Telephone Encounter (Signed)
Pt is requesting a call back from a nurse to discuss her symptoms, pt states she is experiencing nausea and abdominal pain.

## 2020-07-29 NOTE — Telephone Encounter (Signed)
Patient has multiple complaints of abdominal pain and nausea.  She went several days without a BM.  She added 3  stool softeners daily for the last 3 days.  Had a BM this am. Linzess 72 is not helping with a daily BM.Marland Kitchen  She reports upper abdominal pain, epigastric area.  Has lower abdominal pain when ambulating.  Also now has a new pain "under my left breast".  "I am in the bed, I just can't stand this pain".    She is requesting relief from the abdominal pain.  Please advise

## 2020-07-29 NOTE — Telephone Encounter (Signed)
Please advise patient to use GI cocktail and lidocaine patch. Will increase Linzess dose to 127mcg daily, please send new Rx.  Please send referral to pain clinic for management of chronic abdominal pain given she is is having persistent symptoms. No acute GI pathology on recent CT abdomen and pelvis.

## 2020-07-29 NOTE — Telephone Encounter (Signed)
Patient notified of the new recommendations.  She is aware that she will be contacted directly with an appointment dates and time for pain clinic.  She will double her dose of linzess at home, she understands to pick up new rx at the pharmacy.

## 2020-08-02 ENCOUNTER — Other Ambulatory Visit: Payer: Self-pay

## 2020-08-02 ENCOUNTER — Inpatient Hospital Stay: Payer: Medicare Other | Attending: Hematology

## 2020-08-02 ENCOUNTER — Ambulatory Visit: Payer: Medicare Other

## 2020-08-02 VITALS — BP 160/66 | HR 62 | Temp 98.2°F | Resp 16

## 2020-08-02 DIAGNOSIS — C7A8 Other malignant neuroendocrine tumors: Secondary | ICD-10-CM | POA: Diagnosis not present

## 2020-08-02 DIAGNOSIS — D519 Vitamin B12 deficiency anemia, unspecified: Secondary | ICD-10-CM | POA: Diagnosis not present

## 2020-08-02 MED ORDER — CYANOCOBALAMIN 1000 MCG/ML IJ SOLN
INTRAMUSCULAR | Status: AC
  Filled 2020-08-02: qty 1

## 2020-08-02 MED ORDER — CYANOCOBALAMIN 1000 MCG/ML IJ SOLN
1000.0000 ug | Freq: Once | INTRAMUSCULAR | Status: AC
  Administered 2020-08-02: 1000 ug via INTRAMUSCULAR

## 2020-08-02 NOTE — Telephone Encounter (Signed)
Patient notified that her records are under review with the pain clinic.  Patient feels she needs a repeat EGD as she is having dysphagia.

## 2020-08-02 NOTE — Patient Instructions (Signed)

## 2020-08-02 NOTE — Telephone Encounter (Signed)
Patient called in asking if there have been contact with the pain management referral. She states she is in a lot of pain.

## 2020-08-02 NOTE — Telephone Encounter (Signed)
Patient notified she will come in on 08/09/20 11:20

## 2020-08-02 NOTE — Telephone Encounter (Signed)
She had EGD in 2020 with no esophageal stricture, less likely repeat EGD will benefit her. She has known esophageal dysmotility, please advise her to take sips of water and small bites of food. I can discuss further if needed when she has the follow up office visit. Thanks

## 2020-08-05 ENCOUNTER — Other Ambulatory Visit: Payer: Self-pay | Admitting: Gastroenterology

## 2020-08-06 ENCOUNTER — Ambulatory Visit: Payer: Medicare Other | Admitting: Podiatry

## 2020-08-07 ENCOUNTER — Other Ambulatory Visit: Payer: Self-pay

## 2020-08-07 ENCOUNTER — Encounter: Payer: Self-pay | Admitting: Physical Medicine and Rehabilitation

## 2020-08-07 ENCOUNTER — Ambulatory Visit
Admission: RE | Admit: 2020-08-07 | Discharge: 2020-08-07 | Disposition: A | Discharge status: Home / Self Care | Payer: Medicare Other | Source: Ambulatory Visit | Attending: Family | Admitting: Family

## 2020-08-07 DIAGNOSIS — Z1231 Encounter for screening mammogram for malignant neoplasm of breast: Secondary | ICD-10-CM

## 2020-08-09 ENCOUNTER — Ambulatory Visit: Payer: Medicare Other | Admitting: Gastroenterology

## 2020-08-13 ENCOUNTER — Other Ambulatory Visit: Payer: Self-pay

## 2020-08-13 ENCOUNTER — Encounter: Payer: Self-pay | Admitting: Podiatry

## 2020-08-13 ENCOUNTER — Ambulatory Visit (INDEPENDENT_AMBULATORY_CARE_PROVIDER_SITE_OTHER): Payer: Medicare Other | Admitting: Podiatry

## 2020-08-13 DIAGNOSIS — M79675 Pain in left toe(s): Secondary | ICD-10-CM

## 2020-08-13 DIAGNOSIS — E0842 Diabetes mellitus due to underlying condition with diabetic polyneuropathy: Secondary | ICD-10-CM

## 2020-08-13 DIAGNOSIS — N179 Acute kidney failure, unspecified: Secondary | ICD-10-CM | POA: Diagnosis not present

## 2020-08-13 DIAGNOSIS — M79674 Pain in right toe(s): Secondary | ICD-10-CM

## 2020-08-13 DIAGNOSIS — B351 Tinea unguium: Secondary | ICD-10-CM

## 2020-08-13 NOTE — Progress Notes (Signed)
This patient returns to my office for at risk foot care.  This patient requires this care by a professional since this patient will be at risk due to having  Diabetes.  This patient is unable to cut nails herself since the patient cannot reach her nails.These nails are painful walking and wearing shoes.  This patient presents for at risk foot care today.   General Appearance  Alert, conversant and in no acute stress.  Vascular  Dorsalis pedis and posterior tibial  pulses are palpable  bilaterally.  Capillary return is within normal limits  bilaterally. Temperature is within normal limits  bilaterally.  Neurologic  Senn-Weinstein monofilament wire test absent   bilaterally. Muscle power within normal limits bilaterally.  Nails Thick disfigured discolored nails with subungual debris  from hallux to fifth toes bilaterally. No evidence of bacterial infection or drainage bilaterally.  Orthopedic  No limitations of motion  feet .  No crepitus or effusions noted.  No bony pathology or digital deformities noted.  Skin  normotropic skin with no porokeratosis noted bilaterally.  No signs of infections or ulcers noted.   Peeling noted plantar right heel.  Onychomycosis  Pain in right toes  Pain in left toes  Consent was obtained for treatment procedures.   Mechanical debridement of nails 1-5  bilaterally performed with a nail nipper.  Filed with dremel without incident.  Discussed the peeling with this patient.  Since the amlactin is not working I recommended she try cortaid  1%.    Return office visit   3 months                   Told patient to return for periodic foot care and evaluation due to potential at risk complications.   Gardiner Barefoot DPM

## 2020-08-14 ENCOUNTER — Other Ambulatory Visit: Payer: Self-pay | Admitting: Podiatry

## 2020-08-14 DIAGNOSIS — B353 Tinea pedis: Secondary | ICD-10-CM

## 2020-08-16 ENCOUNTER — Other Ambulatory Visit: Payer: Self-pay

## 2020-08-16 ENCOUNTER — Inpatient Hospital Stay: Payer: Medicare Other

## 2020-08-16 ENCOUNTER — Ambulatory Visit: Payer: Medicare Other

## 2020-08-16 DIAGNOSIS — E538 Deficiency of other specified B group vitamins: Secondary | ICD-10-CM

## 2020-08-16 DIAGNOSIS — C7A8 Other malignant neuroendocrine tumors: Secondary | ICD-10-CM

## 2020-08-16 DIAGNOSIS — D519 Vitamin B12 deficiency anemia, unspecified: Secondary | ICD-10-CM | POA: Diagnosis not present

## 2020-08-16 LAB — COMPREHENSIVE METABOLIC PANEL
ALT: 29 U/L (ref 0–44)
AST: 39 U/L (ref 15–41)
Albumin: 4.1 g/dL (ref 3.5–5.0)
Alkaline Phosphatase: 99 U/L (ref 38–126)
Anion gap: 10 (ref 5–15)
BUN: 10 mg/dL (ref 8–23)
CO2: 24 mmol/L (ref 22–32)
Calcium: 9 mg/dL (ref 8.9–10.3)
Chloride: 109 mmol/L (ref 98–111)
Creatinine, Ser: 0.84 mg/dL (ref 0.44–1.00)
GFR, Estimated: >60 mL/min (ref >60)
Glucose, Bld: 87 mg/dL (ref 70–99)
Potassium: 4.3 mmol/L (ref 3.5–5.1)
Sodium: 143 mmol/L (ref 135–145)
Total Bilirubin: 0.4 mg/dL (ref 0.3–1.2)
Total Protein: 7.1 g/dL (ref 6.5–8.1)

## 2020-08-16 LAB — CBC WITH DIFFERENTIAL/PLATELET
Abs Immature Granulocytes: 0.01 10*3/uL (ref 0.00–0.07)
Basophils Absolute: 0 10*3/uL (ref 0.0–0.1)
Basophils Relative: 1 %
Eosinophils Absolute: 0 10*3/uL (ref 0.0–0.5)
Eosinophils Relative: 1 %
HCT: 37.7 % (ref 36.0–46.0)
Hemoglobin: 11.8 g/dL — ABNORMAL LOW (ref 12.0–15.0)
Immature Granulocytes: 0 %
Lymphocytes Relative: 40 %
Lymphs Abs: 2.2 10*3/uL (ref 0.7–4.0)
MCH: 24.4 pg — ABNORMAL LOW (ref 26.0–34.0)
MCHC: 31.3 g/dL (ref 30.0–36.0)
MCV: 77.9 fL — ABNORMAL LOW (ref 80.0–100.0)
Monocytes Absolute: 0.3 10*3/uL (ref 0.1–1.0)
Monocytes Relative: 6 %
Neutro Abs: 2.9 10*3/uL (ref 1.7–7.7)
Neutrophils Relative %: 52 %
Platelets: 174 10*3/uL (ref 150–400)
RBC: 4.84 MIL/uL (ref 3.87–5.11)
RDW: 15 % (ref 11.5–15.5)
WBC: 5.6 10*3/uL (ref 4.0–10.5)
nRBC: 0 % (ref 0.0–0.2)

## 2020-08-16 MED ORDER — CYANOCOBALAMIN 1000 MCG/ML IJ SOLN
1000.0000 ug | Freq: Once | INTRAMUSCULAR | Status: AC
  Administered 2020-08-16: 1000 ug via INTRAMUSCULAR

## 2020-08-16 NOTE — Patient Instructions (Signed)

## 2020-08-19 ENCOUNTER — Other Ambulatory Visit: Payer: Medicare Other | Admitting: Hospice

## 2020-08-19 ENCOUNTER — Other Ambulatory Visit: Payer: Self-pay

## 2020-08-19 DIAGNOSIS — D519 Vitamin B12 deficiency anemia, unspecified: Secondary | ICD-10-CM

## 2020-08-19 DIAGNOSIS — K582 Mixed irritable bowel syndrome: Secondary | ICD-10-CM

## 2020-08-19 DIAGNOSIS — G8929 Other chronic pain: Secondary | ICD-10-CM

## 2020-08-19 DIAGNOSIS — Z515 Encounter for palliative care: Secondary | ICD-10-CM
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
## 2020-08-23 LAB — METHYLMALONIC ACID, SERUM: Methylmalonic Acid, Quantitative: 186 nmol/L (ref 0–378)

## 2020-08-26 ENCOUNTER — Telehealth: Payer: Self-pay | Admitting: Physical Medicine and Rehabilitation

## 2020-08-26 ENCOUNTER — Other Ambulatory Visit: Payer: Self-pay | Admitting: Gastroenterology

## 2020-08-26 NOTE — Telephone Encounter (Signed)
Pt wanting repeat of Bil L5-S1 Facet on 11/30/2019. Okay to sch?

## 2020-08-26 NOTE — Telephone Encounter (Signed)
Yes if criteria met

## 2020-08-26 NOTE — Telephone Encounter (Signed)
Patient called to set an appt. Please call patient at [phone].

## 2020-08-27 NOTE — Telephone Encounter (Signed)
Called pt and sch. 5/10 

## 2020-08-30 ENCOUNTER — Other Ambulatory Visit: Payer: Medicare Other

## 2020-08-30 ENCOUNTER — Inpatient Hospital Stay: Payer: Medicare Other

## 2020-08-30 ENCOUNTER — Other Ambulatory Visit: Payer: Self-pay

## 2020-08-30 ENCOUNTER — Ambulatory Visit: Payer: Medicare Other

## 2020-08-30 VITALS — BP 158/65 | HR 65 | Resp 16

## 2020-08-30 DIAGNOSIS — D519 Vitamin B12 deficiency anemia, unspecified: Secondary | ICD-10-CM

## 2020-08-30 MED ORDER — CYANOCOBALAMIN 1000 MCG/ML IJ SOLN
INTRAMUSCULAR | Status: AC
  Filled 2020-08-30: qty 1

## 2020-08-30 MED ORDER — CYANOCOBALAMIN 1000 MCG/ML IJ SOLN
1000.0000 ug | Freq: Once | INTRAMUSCULAR | Status: AC
  Administered 2020-08-30: 1000 ug via INTRAMUSCULAR

## 2020-08-30 NOTE — Patient Instructions (Signed)

## 2020-09-09 ENCOUNTER — Other Ambulatory Visit: Payer: Self-pay | Admitting: Gastroenterology

## 2020-09-10 ENCOUNTER — Ambulatory Visit (INDEPENDENT_AMBULATORY_CARE_PROVIDER_SITE_OTHER): Payer: Medicare Other | Admitting: Physical Medicine and Rehabilitation

## 2020-09-10 ENCOUNTER — Encounter: Payer: Self-pay | Admitting: Physical Medicine and Rehabilitation

## 2020-09-10 ENCOUNTER — Other Ambulatory Visit: Payer: Self-pay

## 2020-09-10 ENCOUNTER — Ambulatory Visit: Payer: Self-pay

## 2020-09-10 VITALS — BP 139/84 | HR 64

## 2020-09-10 DIAGNOSIS — M47816 Spondylosis without myelopathy or radiculopathy, lumbar region: Secondary | ICD-10-CM | POA: Diagnosis not present

## 2020-09-10 DIAGNOSIS — M5116 Intervertebral disc disorders with radiculopathy, lumbar region: Secondary | ICD-10-CM

## 2020-09-10 MED ORDER — BETAMETHASONE SOD PHOS & ACET 6 (3-3) MG/ML IJ SUSP
12.0000 mg | Freq: Once | INTRAMUSCULAR | Status: AC
  Administered 2020-09-10: 12 mg

## 2020-09-10 NOTE — Patient Instructions (Signed)

## 2020-09-10 NOTE — Procedures (Signed)
Lumbar Facet Joint Intra-Articular Injection(s) with Fluoroscopic Guidance  Patient: Emily [name]      Date of Birth: 09/03/1940 MRN: [id number] PCP: Buzzy Han, MD      Visit Date: 09/10/2020   Universal Protocol:    Date/Time: 09/10/2020  Consent Given By: the patient  Position: PRONE   Additional Comments: Vital signs were monitored before and after the procedure. Patient was prepped and draped in the usual sterile fashion. The correct patient, procedure, and site was verified.   Injection Procedure Details:  Procedure Site One Meds Administered:  Meds ordered this encounter  Medications  . betamethasone acetate-betamethasone sodium phosphate (CELESTONE) injection 12 mg     Laterality: Bilateral  Location/Site:  L5-S1  Needle size: 22 guage  Needle type: Spinal  Needle Placement: Articular  Findings:  -Comments: Excellent flow of contrast producing a partial arthrogram.  Procedure Details: The fluoroscope beam is vertically oriented in AP, and the inferior recess is visualized beneath the lower pole of the inferior apophyseal process, which represents the target point for needle insertion. When direct visualization is difficult the target point is located at the medial projection of the vertebral pedicle. The region overlying each aforementioned target is locally anesthetized with a 1 to 2 ml. volume of 1% Lidocaine without Epinephrine.   The spinal needle was inserted into each of the above mentioned facet joints using biplanar fluoroscopic guidance. A 0.25 to 0.5 ml. volume of Isovue-250 was injected and a partial facet joint arthrogram was obtained. A single spot film was obtained of the resulting arthrogram.    One to 1.25 ml of the steroid/anesthetic solution was then injected into each of the facet joints noted above.   Additional Comments:  The patient tolerated the procedure well Dressing: 2 x 2 sterile gauze and Band-Aid    Post-procedure  details: Patient was observed during the procedure. Post-procedure instructions were reviewed.  Patient left the clinic in stable condition.

## 2020-09-10 NOTE — Progress Notes (Signed)
Emily [name] - 80 y.o. female MRN [id number]  Date of birth: Apr 24, 1941  Office Visit Note: Visit Date: 09/10/2020 PCP: Buzzy Han, MD Referred by: Buzzy Han*  Subjective: Chief Complaint  Patient presents with   Lower Back - Pain   HPI:  Emily [name] is a 80 y.o. female who comes in today at the request of Dr. Jean Rosenthal for planned Bilateral L5-S1 Lumbar facet/medial branch block with fluoroscopic guidance.  The patient has failed conservative care including home exercise, medications, time and activity modification.  This injection will be diagnostic and hopefully therapeutic.  Please see requesting physician notes for further details and justification.  Exam has shown concordant pain with facet joint loading.   ROS Otherwise per HPI.  Assessment & Plan: Visit Diagnoses:    ICD-10-CM   1. Spondylosis without myelopathy or radiculopathy, lumbar region  M47.816 XR C-ARM NO REPORT    betamethasone acetate-betamethasone sodium phosphate (CELESTONE) injection 12 mg    Facet Injection    CANCELED: Epidural Steroid injection    2. Radiculopathy due to lumbar intervertebral disc disorder  M51.16       Plan: No additional findings.   Meds & Orders:  Meds ordered this encounter  Medications   betamethasone acetate-betamethasone sodium phosphate (CELESTONE) injection 12 mg    Orders Placed This Encounter  Procedures   Facet Injection   XR C-ARM NO REPORT    Follow-up: Return for visit to requesting physician as needed.   Procedures: No procedures performed  Lumbar Facet Joint Intra-Articular Injection(s) with Fluoroscopic Guidance  Patient: Emily [name]      Date of Birth: Feb 18, 1941 MRN: [id number] PCP: Buzzy Han, MD      Visit Date: 09/10/2020   Universal Protocol:    Date/Time: 09/10/2020  Consent Given By: the patient  Position: PRONE   Additional Comments: Vital signs were monitored before and after the  procedure. Patient was prepped and draped in the usual sterile fashion. The correct patient, procedure, and site was verified.   Injection Procedure Details:  Procedure Site One Meds Administered:  Meds ordered this encounter  Medications   betamethasone acetate-betamethasone sodium phosphate (CELESTONE) injection 12 mg     Laterality: Bilateral  Location/Site:  L5-S1  Needle size: 22 guage  Needle type: Spinal  Needle Placement: Articular  Findings:  -Comments: Excellent flow of contrast producing a partial arthrogram.  Procedure Details: The fluoroscope beam is vertically oriented in AP, and the inferior recess is visualized beneath the lower pole of the inferior apophyseal process, which represents the target point for needle insertion. When direct visualization is difficult the target point is located at the medial projection of the vertebral pedicle. The region overlying each aforementioned target is locally anesthetized with a 1 to 2 ml. volume of 1% Lidocaine without Epinephrine.   The spinal needle was inserted into each of the above mentioned facet joints using biplanar fluoroscopic guidance. A 0.25 to 0.5 ml. volume of Isovue-250 was injected and a partial facet joint arthrogram was obtained. A single spot film was obtained of the resulting arthrogram.    One to 1.25 ml of the steroid/anesthetic solution was then injected into each of the facet joints noted above.   Additional Comments:  The patient tolerated the procedure well Dressing: 2 x 2 sterile gauze and Band-Aid    Post-procedure details: Patient was observed during the procedure. Post-procedure instructions were reviewed.  Patient left the clinic in stable condition.   Clinical History: IMPRESSION: 1. At L5-S1 there  is a broad-based disc bulge with bilateral facet arthropathy and right lateral recess stenosis. 2. At L1-2 there is a broad-based disc bulge eccentric towards the right. Mild bilateral  facet arthropathy.     Electronically Signed   By: Kathreen Devoid   On: 03/24/2016 16:05     Objective:  VS:  HT:    WT:   BMI:     BP:139/84  HR:64bpm  TEMP: ( )  RESP:  Physical Exam Vitals and nursing note reviewed.  Constitutional:      General: She is not in acute distress.    Appearance: Normal appearance. She is not ill-appearing.  HENT:     Head: Normocephalic and atraumatic.     Right Ear: External ear normal.     Left Ear: External ear normal.  Eyes:     Extraocular Movements: Extraocular movements intact.  Cardiovascular:     Rate and Rhythm: Normal rate.     Pulses: Normal pulses.  Pulmonary:     Effort: Pulmonary effort is normal. No respiratory distress.  Abdominal:     General: There is no distension.     Palpations: Abdomen is soft.  Musculoskeletal:        General: Tenderness present.     Cervical back: Neck supple.     Right lower leg: No edema.     Left lower leg: No edema.     Comments: Patient has good distal strength with no pain over the greater trochanters.  No clonus or focal weakness. Patient somewhat slow to rise from a seated position to full extension.  There is concordant low back pain with facet loading and lumbar spine extension rotation.  There are no definitive trigger points but the patient is somewhat tender across the lower back and PSIS.  There is no pain with hip rotation.   Skin:    Findings: No erythema, lesion or rash.  Neurological:     General: No focal deficit present.     Mental Status: She is alert and oriented to person, place, and time.     Sensory: No sensory deficit.     Motor: No weakness or abnormal muscle tone.     Coordination: Coordination normal.  Psychiatric:        Mood and Affect: Mood normal.        Behavior: Behavior normal.     Imaging: No results found.

## 2020-09-10 NOTE — Progress Notes (Signed)
Injection 11/30/19 helped a lot. Bent down to pick something up and pain returned. Pain across low back which goes up into mid back. No pain right now. Numeric Pain Rating Scale and Functional Assessment Average Pain 9   In the last MONTH (on 0-10 scale) has pain interfered with the following?  1. General activity like being  able to carry out your everyday physical activities such as walking, climbing stairs, carrying groceries, or moving a chair?  Rating(9)   +Driver, -BT, -Dye Allergies.

## 2020-09-13 ENCOUNTER — Inpatient Hospital Stay: Payer: Medicare Other | Attending: Hematology

## 2020-09-13 ENCOUNTER — Other Ambulatory Visit: Payer: Medicare Other

## 2020-09-13 ENCOUNTER — Inpatient Hospital Stay: Payer: Medicare Other

## 2020-09-13 ENCOUNTER — Ambulatory Visit: Payer: Medicare Other

## 2020-09-13 ENCOUNTER — Other Ambulatory Visit: Payer: Self-pay

## 2020-09-13 VITALS — BP 163/73 | HR 57 | Temp 98.7°F | Resp 16

## 2020-09-13 DIAGNOSIS — Z79899 Other long term (current) drug therapy: Secondary | ICD-10-CM | POA: Diagnosis not present

## 2020-09-13 DIAGNOSIS — D519 Vitamin B12 deficiency anemia, unspecified: Secondary | ICD-10-CM

## 2020-09-13 DIAGNOSIS — E538 Deficiency of other specified B group vitamins: Secondary | ICD-10-CM

## 2020-09-13 DIAGNOSIS — C7A8 Other malignant neuroendocrine tumors: Secondary | ICD-10-CM

## 2020-09-13 DIAGNOSIS — D649 Anemia, unspecified: Secondary | ICD-10-CM

## 2020-09-13 LAB — CBC WITH DIFFERENTIAL/PLATELET
Abs Immature Granulocytes: 0.03 10*3/uL (ref 0.00–0.07)
Basophils Absolute: 0 10*3/uL (ref 0.0–0.1)
Basophils Relative: 0 %
Eosinophils Absolute: 0 10*3/uL (ref 0.0–0.5)
Eosinophils Relative: 0 %
HCT: 35.2 % — ABNORMAL LOW (ref 36.0–46.0)
Hemoglobin: 11.4 g/dL — ABNORMAL LOW (ref 12.0–15.0)
Immature Granulocytes: 0 %
Lymphocytes Relative: 30 %
Lymphs Abs: 3 10*3/uL (ref 0.7–4.0)
MCH: 24.6 pg — ABNORMAL LOW (ref 26.0–34.0)
MCHC: 32.4 g/dL (ref 30.0–36.0)
MCV: 75.9 fL — ABNORMAL LOW (ref 80.0–100.0)
Monocytes Absolute: 0.7 10*3/uL (ref 0.1–1.0)
Monocytes Relative: 7 %
Neutro Abs: 6.2 10*3/uL (ref 1.7–7.7)
Neutrophils Relative %: 63 %
Platelets: 179 10*3/uL (ref 150–400)
RBC: 4.64 MIL/uL (ref 3.87–5.11)
RDW: 14.4 % (ref 11.5–15.5)
WBC: 10 10*3/uL (ref 4.0–10.5)
nRBC: 0 % (ref 0.0–0.2)

## 2020-09-13 LAB — FERRITIN: Ferritin: 105 ng/mL (ref 11–307)

## 2020-09-13 LAB — IRON AND TIBC
Iron: 127 ug/dL (ref 41–142)
Saturation Ratios: 41 % (ref 21–57)
TIBC: 311 ug/dL (ref 236–444)
UIBC: 184 ug/dL (ref 120–384)

## 2020-09-13 LAB — VITAMIN B12: Vitamin B-12: 680 pg/mL (ref 180–914)

## 2020-09-13 MED ORDER — CYANOCOBALAMIN 1000 MCG/ML IJ SOLN
1000.0000 ug | Freq: Once | INTRAMUSCULAR | Status: AC
  Administered 2020-09-13: 1000 ug via INTRAMUSCULAR

## 2020-09-13 MED ORDER — CYANOCOBALAMIN 1000 MCG/ML IJ SOLN
INTRAMUSCULAR | Status: AC
  Filled 2020-09-13: qty 1

## 2020-09-16 ENCOUNTER — Telehealth: Payer: Self-pay | Admitting: Hematology

## 2020-09-16 NOTE — Telephone Encounter (Signed)
R/s appt per 5/13 sch msg. Pt aware.  

## 2020-09-18 LAB — CHROMOGRANIN A: Chromogranin A (ng/mL): 52.2 ng/mL (ref 0.0–101.8)

## 2020-09-19 ENCOUNTER — Telehealth: Payer: Self-pay

## 2020-09-19 NOTE — Telephone Encounter (Signed)
-----   Message from Truitt Merle, MD sent at 09/17/2020  7:39 PM EDT ----- Please let pt know her lab results, B12 and iron levels are good, no need iv iron for now, thanks   Truitt Merle  09/17/2020

## 2020-09-19 NOTE — Telephone Encounter (Signed)
-----   Message from Yan Feng, MD sent at 09/17/2020  7:39 PM EDT ----- Please let pt know her lab results, B12 and iron levels are good, no need iv iron for now, thanks   Yan Feng  09/17/2020  

## 2020-09-19 NOTE — Telephone Encounter (Signed)
I left vm for Ms Neitzke with Dr Ernestina Penna recommendations.

## 2020-09-20 LAB — METHYLMALONIC ACID, SERUM: Methylmalonic Acid, Quantitative: 165 nmol/L (ref 0–378)

## 2020-09-23 ENCOUNTER — Ambulatory Visit: Payer: Medicare Other | Admitting: Physical Medicine and Rehabilitation

## 2020-09-27 ENCOUNTER — Ambulatory Visit: Payer: Medicare Other

## 2020-09-27 ENCOUNTER — Encounter: Payer: Self-pay | Admitting: Physical Medicine and Rehabilitation

## 2020-09-27 ENCOUNTER — Encounter
Payer: Medicare Other | Attending: Physical Medicine and Rehabilitation | Admitting: Physical Medicine and Rehabilitation

## 2020-09-27 ENCOUNTER — Other Ambulatory Visit: Payer: Self-pay

## 2020-09-27 VITALS — BP 150/74 | HR 66 | Temp 99.4°F | Ht 62.0 in | Wt 107.0 lb

## 2020-09-27 DIAGNOSIS — R1084 Generalized abdominal pain: Secondary | ICD-10-CM | POA: Insufficient documentation

## 2020-09-27 DIAGNOSIS — R112 Nausea with vomiting, unspecified: Secondary | ICD-10-CM | POA: Diagnosis not present

## 2020-09-27 MED ORDER — GABAPENTIN 400 MG PO CAPS: 400.0000 mg | ORAL_CAPSULE | Freq: Three times a day (TID) | ORAL | 4 refills | Status: DC
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
## 2020-09-27 NOTE — Patient Instructions (Signed)
-  Discussed foods that support the microbiome:   1) carrots  2) onions  3) radishes  4) tomatoes  5) turmeric  6) jicama  7) garlic

## 2020-09-28 IMAGING — MR MR HEAD W/O CM
9 of 10 series · 38 of 48 positions shown · non-contrast
Comparison: Head CT 05/23/2018, brain MRI 10/30/2017

CLINICAL DATA: Unstable gait. Additional history provided: Patient
reports dizziness

EXAM:
MRI HEAD WITHOUT CONTRAST
TECHNIQUE: Multiplanar, multiecho pulse sequences of the brain and surrounding
structures were obtained without intravenous contrast.

[Series 3: DWI · axial · 3.0mm · 1.09mm/px · z∈[-86,+73]mm · 11 of 108 slices shown (1 of 4)]
[im 1/108]
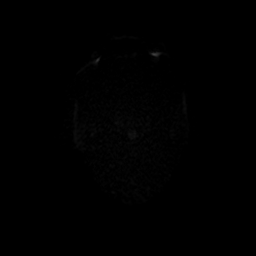
[im 11/108]
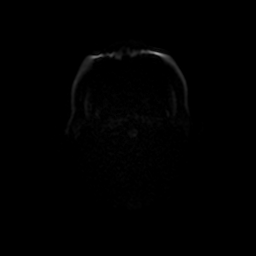
[im 22/108]
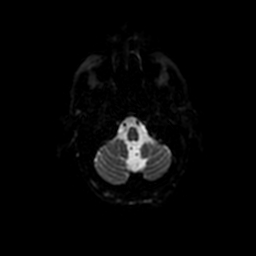
[im 33/108]
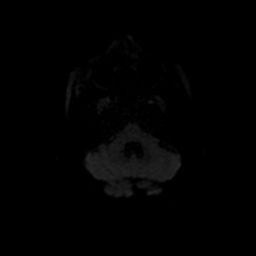
[im 43/108]
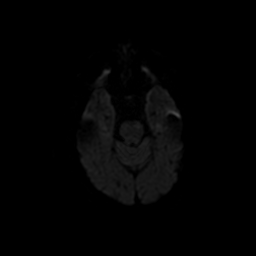
[im 54/108]
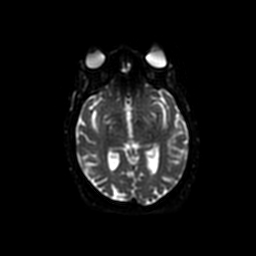
[im 65/108]
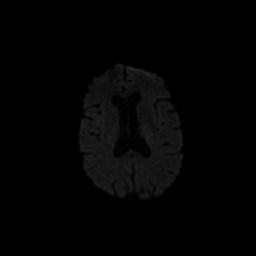
[im 75/108]
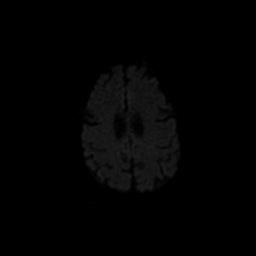
[im 86/108]
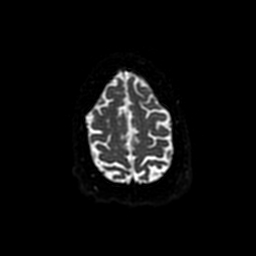
[im 97/108]
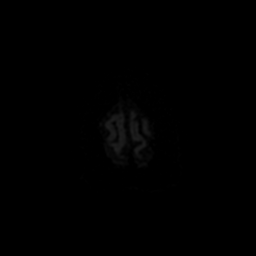
[im 108/108]
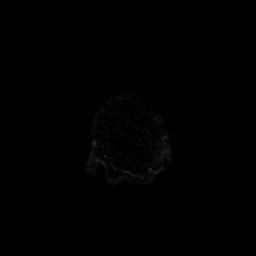

[Series 4: DWI · coronal · 5.0mm · 1.09mm/px · 8 of 76 slices shown (2 of 4)]
[im 1/76]
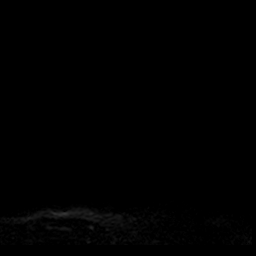
[im 11/76]
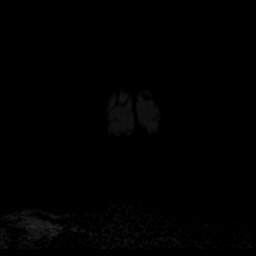
[im 22/76]
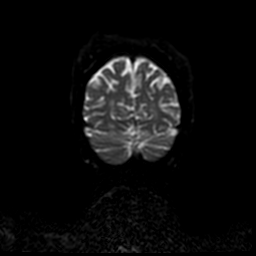
[im 33/76]
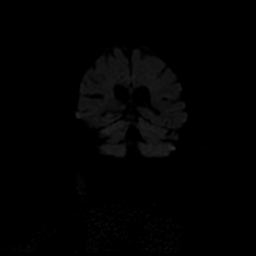
[im 43/76]
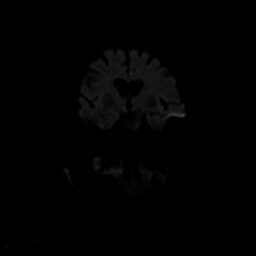
[im 54/76]
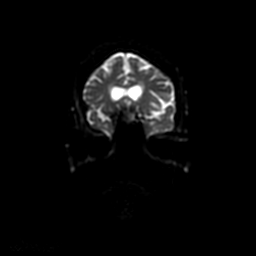
[im 65/76]
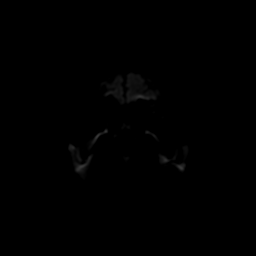
[im 76/76]
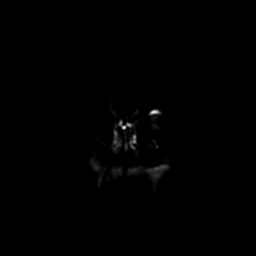

[Series 5: T1 · sagittal · 5.0mm · 0.47mm/px · 3 of 23 slices shown (1 of 2)]
[im 1/23]
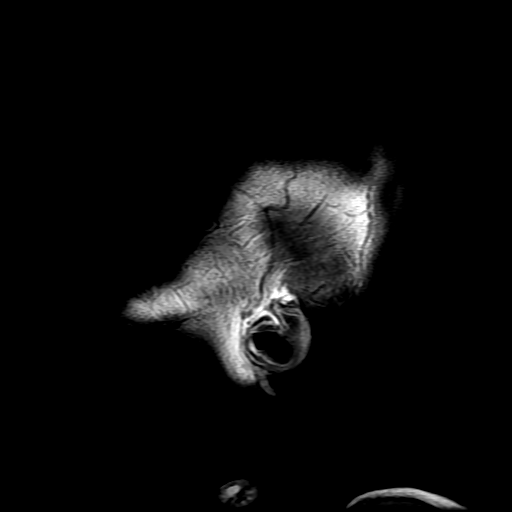
[im 12/23]
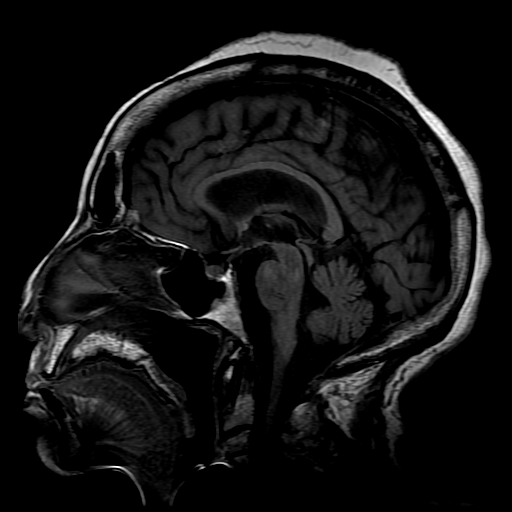
[im 23/23]
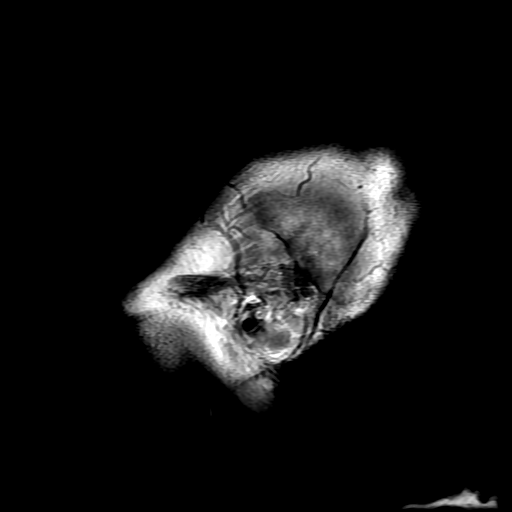

[Series 6: T2 · axial · 5.0mm · 0.43mm/px · z∈[-67,+69]mm · 2 of 24 slices shown (1 of 2)]
[im 1/24]
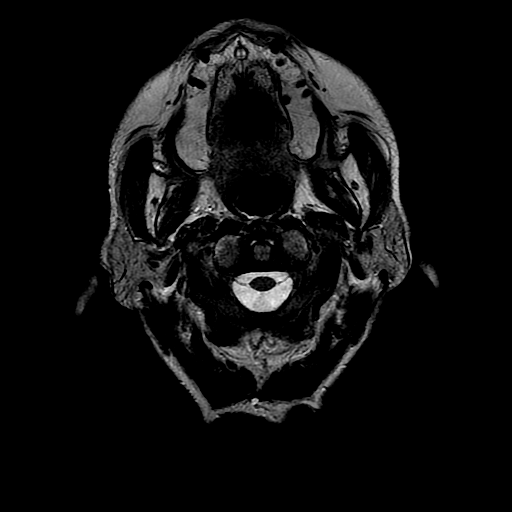
[im 24/24]
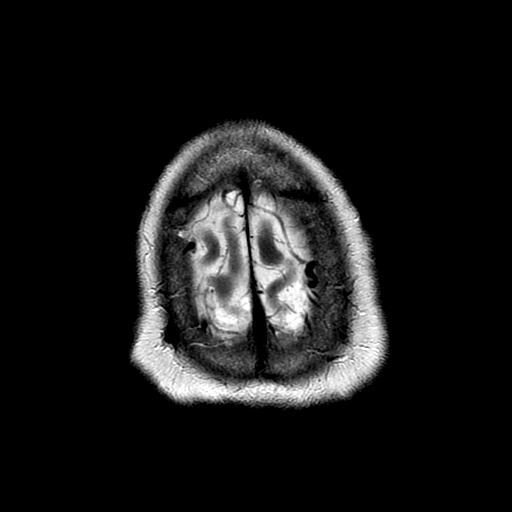

[Series 7: FLAIR · axial · 3.0mm · 0.43mm/px · z∈[-67,+69]mm · 2 of 24 slices shown]
[im 1/24]
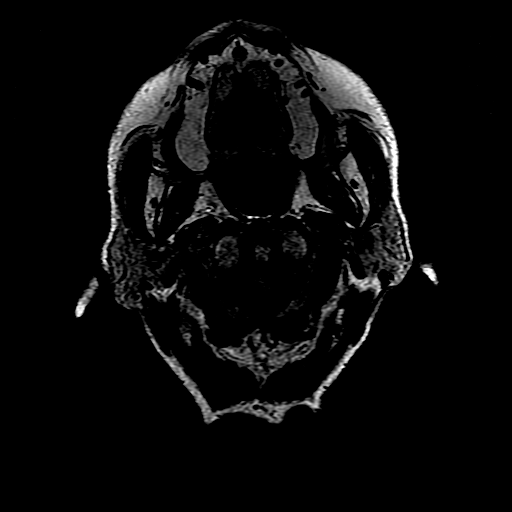
[im 24/24]
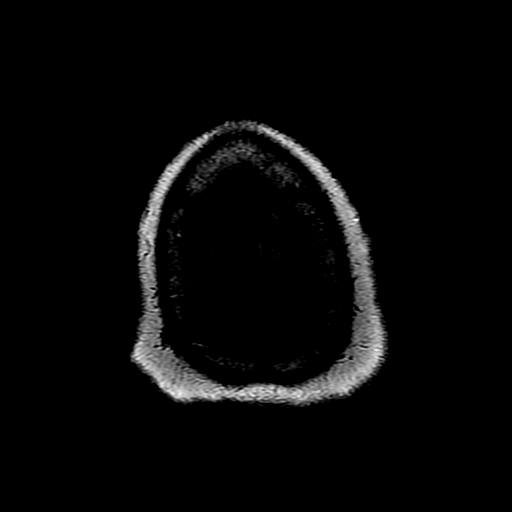

[Series 9: T1 · axial · 3.0mm · 0.43mm/px · z∈[-72,-56]mm · 2 of 104 slices shown (2 of 2)]
[im 1/104]
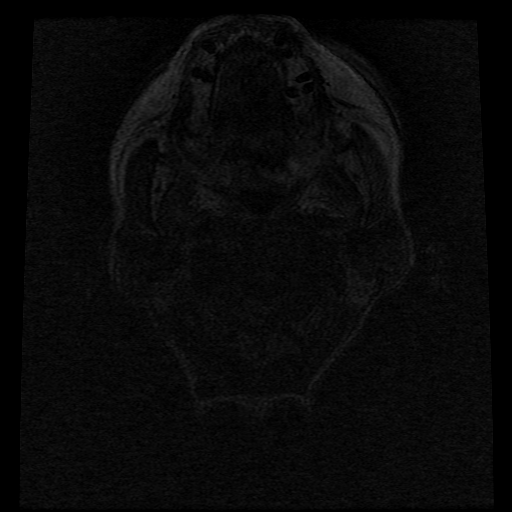
[im 12/104]
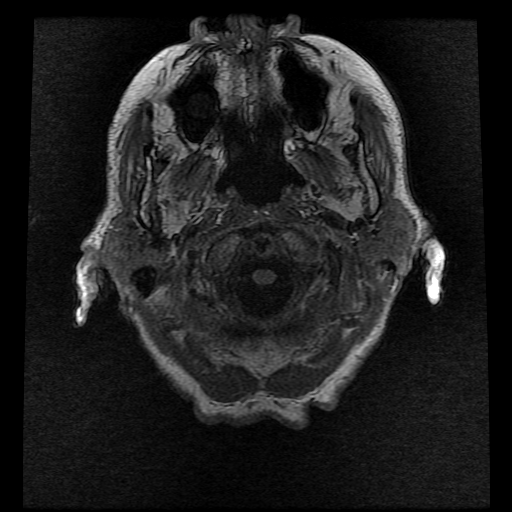

[Series 10: T2 · coronal · 5.0mm · 0.39mm/px · 2 of 25 slices shown (2 of 2)]
[im 1/25]
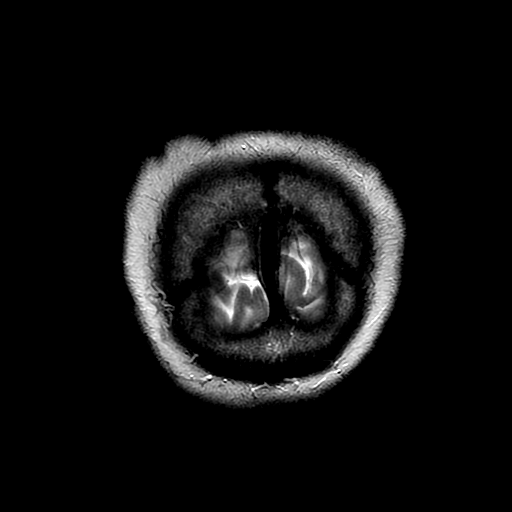
[im 25/25]
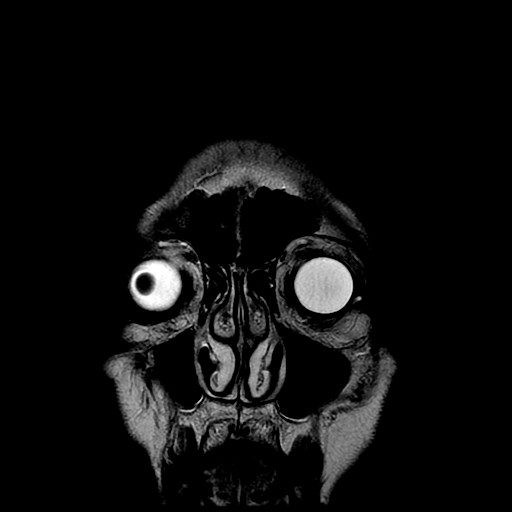

[Series 300: DWI · axial · 3.0mm · 1.09mm/px · z∈[-86,+73]mm · 5 of 54 slices shown (3 of 4)]
[im 1/54]
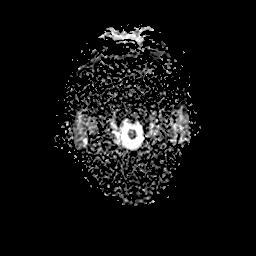
[im 14/54]
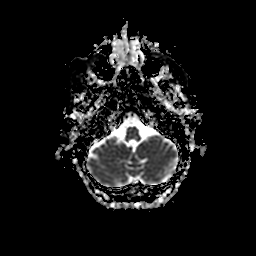
[im 27/54]
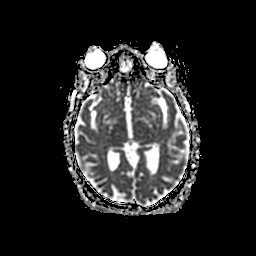
[im 40/54]
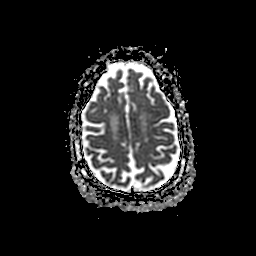
[im 54/54]
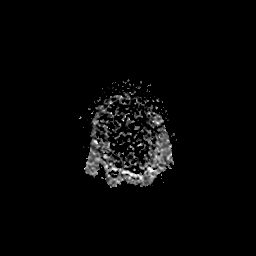

[Series 400: DWI · coronal · 5.0mm · 1.09mm/px · 3 of 36 slices shown (4 of 4)]
[im 1/36]
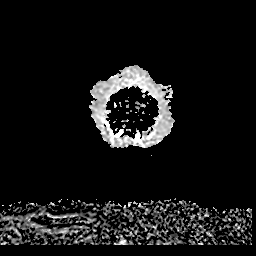
[im 18/36]
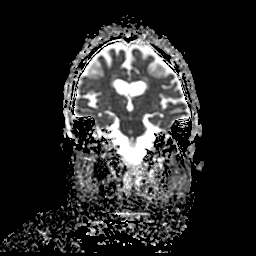
[im 36/36]
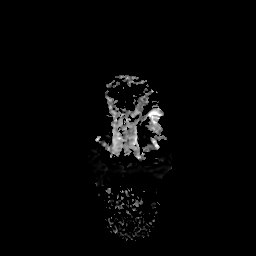

[38 of 48 positions shown; findings below may reference images not displayed]

FINDINGS: Brain:

There is no evidence of acute infarct.

No evidence of intracranial mass.

No midline shift or extra-axial fluid collection.

Mild scattered T2/FLAIR hyperintensity within the cerebral white
matter is nonspecific, but consistent with chronic small vessel
ischemic disease. Redemonstrated chronic lacunar infarcts within the
right thalamus and right pons. Redemonstrated chronic hemorrhagic
infarct within the left external capsule. Moderate generalized
parenchymal atrophy, stable from MRI 10/30/2017.

Vascular: Flow voids maintained within the proximal large arterial
vessels.

Skull and upper cervical spine: No focal marrow lesion.

Sinuses/Orbits: Visualized orbits demonstrate no acute abnormality.
Left lens replacement. Mild ethmoid sinus mucosal thickening. Small
bilateral maxillary sinus mucous retention cysts. No significant
mastoid effusion.
IMPRESSION: No evidence of acute intracranial abnormality.

Redemonstrated chronic lacunar infarcts within the left external
capsule, right thalamus and right pons. Stable background of
otherwise mild chronic small vessel ischemic disease.

Moderate generalized parenchymal atrophy.

## 2020-10-01 ENCOUNTER — Telehealth: Payer: Self-pay | Admitting: Hematology

## 2020-10-01 NOTE — Telephone Encounter (Signed)
R/s appts per 5/27 sch msg. Pt aware.

## 2020-10-02 ENCOUNTER — Inpatient Hospital Stay: Payer: Medicare Other

## 2020-10-04 ENCOUNTER — Inpatient Hospital Stay: Payer: Medicare Other | Attending: Hematology

## 2020-10-04 ENCOUNTER — Other Ambulatory Visit: Payer: Self-pay

## 2020-10-04 DIAGNOSIS — D519 Vitamin B12 deficiency anemia, unspecified: Secondary | ICD-10-CM

## 2020-10-04 MED ORDER — CYANOCOBALAMIN 1000 MCG/ML IJ SOLN
INTRAMUSCULAR | Status: AC
  Filled 2020-10-04: qty 1

## 2020-10-04 MED ORDER — CYANOCOBALAMIN 1000 MCG/ML IJ SOLN
1000.0000 ug | Freq: Once | INTRAMUSCULAR | Status: AC
  Administered 2020-10-04: 1000 ug via INTRAMUSCULAR

## 2020-10-04 NOTE — Patient Instructions (Signed)

## 2020-10-07 ENCOUNTER — Telehealth: Payer: Self-pay | Admitting: Physical Medicine and Rehabilitation

## 2020-10-07 NOTE — Telephone Encounter (Signed)
Patient states need to discuss RX [phone] no other info

## 2020-10-08 NOTE — Telephone Encounter (Signed)
Can you please schedule her for phone visit now? Thank you!

## 2020-10-10 ENCOUNTER — Encounter
Payer: Medicare Other | Attending: Physical Medicine and Rehabilitation | Admitting: Physical Medicine and Rehabilitation

## 2020-10-10 ENCOUNTER — Other Ambulatory Visit: Payer: Self-pay

## 2020-10-10 DIAGNOSIS — R1084 Generalized abdominal pain: Secondary | ICD-10-CM

## 2020-10-10 MED ORDER — TIZANIDINE HCL 4 MG PO TABS: 4.0000 mg | ORAL_TABLET | Freq: Two times a day (BID) | ORAL | 3 refills | Status: DC
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
## 2020-10-11 ENCOUNTER — Ambulatory Visit: Payer: Medicare Other

## 2020-10-11 ENCOUNTER — Inpatient Hospital Stay: Payer: Medicare Other

## 2020-10-11 ENCOUNTER — Other Ambulatory Visit: Payer: Medicare Other

## 2020-10-11 VITALS — BP 143/64 | HR 70 | Temp 98.2°F | Resp 16

## 2020-10-11 DIAGNOSIS — C7A8 Other malignant neuroendocrine tumors: Secondary | ICD-10-CM

## 2020-10-11 DIAGNOSIS — D519 Vitamin B12 deficiency anemia, unspecified: Secondary | ICD-10-CM | POA: Diagnosis not present

## 2020-10-11 LAB — CBC WITH DIFFERENTIAL/PLATELET
Abs Immature Granulocytes: 0.01 10*3/uL (ref 0.00–0.07)
Basophils Absolute: 0 10*3/uL (ref 0.0–0.1)
Basophils Relative: 0 %
Eosinophils Absolute: 0 10*3/uL (ref 0.0–0.5)
Eosinophils Relative: 0 %
HCT: 37.3 % (ref 36.0–46.0)
Hemoglobin: 11.6 g/dL — ABNORMAL LOW (ref 12.0–15.0)
Immature Granulocytes: 0 %
Lymphocytes Relative: 32 %
Lymphs Abs: 1.8 10*3/uL (ref 0.7–4.0)
MCH: 24.1 pg — ABNORMAL LOW (ref 26.0–34.0)
MCHC: 31.1 g/dL (ref 30.0–36.0)
MCV: 77.4 fL — ABNORMAL LOW (ref 80.0–100.0)
Monocytes Absolute: 0.4 10*3/uL (ref 0.1–1.0)
Monocytes Relative: 6 %
Neutro Abs: 3.4 10*3/uL (ref 1.7–7.7)
Neutrophils Relative %: 62 %
Platelets: 169 10*3/uL (ref 150–400)
RBC: 4.82 MIL/uL (ref 3.87–5.11)
RDW: 14.2 % (ref 11.5–15.5)
WBC: 5.6 10*3/uL (ref 4.0–10.5)
nRBC: 0 % (ref 0.0–0.2)

## 2020-10-11 MED ORDER — CYANOCOBALAMIN 1000 MCG/ML IJ SOLN
1000.0000 ug | Freq: Once | INTRAMUSCULAR | Status: AC
Start: 2020-10-11 — End: 2020-10-11
  Administered 2020-10-11: 1000 ug via INTRAMUSCULAR

## 2020-10-11 MED ORDER — CYANOCOBALAMIN 1000 MCG/ML IJ SOLN
INTRAMUSCULAR | Status: AC
  Filled 2020-10-11: qty 1

## 2020-10-11 NOTE — Progress Notes (Signed)
MD Burr Medico confirmed pt can get B12 inj today.

## 2020-10-14 ENCOUNTER — Other Ambulatory Visit: Payer: Self-pay

## 2020-10-14 ENCOUNTER — Other Ambulatory Visit: Payer: Medicare Other | Admitting: Hospice

## 2020-10-14 DIAGNOSIS — D519 Vitamin B12 deficiency anemia, unspecified: Secondary | ICD-10-CM

## 2020-10-14 DIAGNOSIS — Z515 Encounter for palliative care: Secondary | ICD-10-CM

## 2020-10-14 DIAGNOSIS — G8929 Other chronic pain: Secondary | ICD-10-CM
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
## 2020-10-22 ENCOUNTER — Other Ambulatory Visit: Payer: Self-pay | Admitting: Gastroenterology

## 2020-10-24 ENCOUNTER — Ambulatory Visit (INDEPENDENT_AMBULATORY_CARE_PROVIDER_SITE_OTHER): Payer: Medicare Other | Admitting: Neurology

## 2020-10-24 ENCOUNTER — Other Ambulatory Visit: Payer: Self-pay

## 2020-10-24 ENCOUNTER — Encounter: Payer: Self-pay | Admitting: Neurology

## 2020-10-24 VITALS — BP 157/62 | HR 72 | Ht 62.0 in | Wt 110.2 lb

## 2020-10-24 DIAGNOSIS — I679 Cerebrovascular disease, unspecified: Secondary | ICD-10-CM

## 2020-10-24 DIAGNOSIS — F444 Conversion disorder with motor symptom or deficit: Secondary | ICD-10-CM | POA: Diagnosis not present

## 2020-10-24 DIAGNOSIS — R269 Unspecified abnormalities of gait and mobility: Secondary | ICD-10-CM | POA: Insufficient documentation

## 2020-10-24 HISTORY — DX: Conversion disorder with motor symptom or deficit: F44.4
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
## 2020-10-25 ENCOUNTER — Ambulatory Visit: Payer: Medicare Other

## 2020-10-25 ENCOUNTER — Inpatient Hospital Stay: Payer: Medicare Other

## 2020-10-25 DIAGNOSIS — D519 Vitamin B12 deficiency anemia, unspecified: Secondary | ICD-10-CM

## 2020-10-25 MED ORDER — CYANOCOBALAMIN 1000 MCG/ML IJ SOLN
1000.0000 ug | Freq: Once | INTRAMUSCULAR | Status: AC
  Administered 2020-10-25: 1000 ug via INTRAMUSCULAR

## 2020-10-25 NOTE — Patient Instructions (Signed)
Vitamin B12 Injection What is this medication? Vitamin B12 (VAHY tuh min B12) prevents and treats low vitamin B12 levels in your body. It is used in people who do not get enough vitamin B12 from their diet or when their digestive tract does not absorb enough. Vitamin B12 plays an important role in maintaining the health of your nervous system and red bloodcells. This medicine may be used for other purposes; ask your health care provider orpharmacist if you have questions. COMMON BRAND NAME(S): B-12 Compliance Kit, B-12 Injection Kit, Cyomin, LA-12,Nutri-Twelve, Physicians EZ Use B-12, Primabalt What should I tell my care team before I take this medication? They need to know if you have any of these conditions: Kidney disease Leber's disease Megaloblastic anemia An unusual or allergic reaction to cyanocobalamin, cobalt, other medications, foods, dyes, or preservatives Pregnant or trying to get pregnant Breast-feeding How should I use this medication? This medication is injected into a muscle or deeply under the skin. It is usually given in a clinic or care team's office. However, your care team mayteach you how to inject yourself. Follow all instructions. Talk to your care team about the use of this medication in children. Specialcare may be needed. Overdosage: If you think you have taken too much of this medicine contact apoison control center or emergency room at once. NOTE: This medicine is only for you. Do not share this medicine with others. What if I miss a dose? If you are given your dose at a clinic or care team's office, call to reschedule your appointment. If you give your own injections, and you miss a dose, take it as soon as you can. If it is almost time for your next dose, takeonly that dose. Do not take double or extra doses. What may interact with this medication? Colchicine Heavy alcohol intake This list may not describe all possible interactions. Give your health care provider  a list of all the medicines, herbs, non-prescription drugs, or dietary supplements you use. Also tell them if you smoke, drink alcohol, or use illegaldrugs. Some items may interact with your medicine. What should I watch for while using this medication? Visit your care team regularly. You may need blood work done while you aretaking this medication. You may need to follow a special diet. Talk to your care team. Limit youralcohol intake and avoid smoking to get the best benefit. What side effects may I notice from receiving this medication? Side effects that you should report to your care team as soon as possible: Allergic reactions-skin rash, itching, hives, swelling of the face, lips, tongue, or throat Swelling of the ankles, hands, or feet Trouble breathing Side effects that usually do not require medical attention (report to your careteam if they continue or are bothersome): Diarrhea This list may not describe all possible side effects. Call your doctor for medical advice about side effects. You may report side effects to FDA at1-800-FDA-1088. Where should I keep my medication? Keep out of the reach of children. Store at room temperature between 15 and 30 degrees C (59 and 85 degrees F).Protect from light. Throw away any unused medication after the expiration date. NOTE: This sheet is a summary. It may not cover all possible information. If you have questions about this medicine, talk to your doctor, pharmacist, orhealth care provider.  2022 Elsevier/Gold Standard (2020-06-10 11:47:06)  

## 2020-11-05 ENCOUNTER — Telehealth: Payer: Self-pay

## 2020-11-05 NOTE — Telephone Encounter (Signed)
Emily [name] has accepted patient and will start services. P: [phone].

## 2020-11-08 ENCOUNTER — Other Ambulatory Visit: Payer: Medicare Other

## 2020-11-08 ENCOUNTER — Inpatient Hospital Stay: Payer: Medicare Other | Attending: Hematology

## 2020-11-08 ENCOUNTER — Ambulatory Visit: Payer: Medicare Other | Admitting: Hematology

## 2020-11-08 ENCOUNTER — Ambulatory Visit: Payer: Medicare Other

## 2020-11-08 ENCOUNTER — Inpatient Hospital Stay: Payer: Medicare Other

## 2020-11-08 ENCOUNTER — Other Ambulatory Visit: Payer: Self-pay

## 2020-11-08 ENCOUNTER — Inpatient Hospital Stay (HOSPITAL_BASED_OUTPATIENT_CLINIC_OR_DEPARTMENT_OTHER): Payer: Medicare Other | Admitting: Hematology

## 2020-11-08 VITALS — BP 143/61 | HR 64 | Temp 98.5°F | Resp 17 | Ht 62.0 in | Wt 111.3 lb

## 2020-11-08 DIAGNOSIS — D519 Vitamin B12 deficiency anemia, unspecified: Secondary | ICD-10-CM

## 2020-11-08 DIAGNOSIS — C7A8 Other malignant neuroendocrine tumors: Secondary | ICD-10-CM | POA: Diagnosis not present

## 2020-11-08 DIAGNOSIS — D649 Anemia, unspecified: Secondary | ICD-10-CM | POA: Diagnosis not present

## 2020-11-08 DIAGNOSIS — D508 Other iron deficiency anemias: Secondary | ICD-10-CM | POA: Diagnosis not present

## 2020-11-08 DIAGNOSIS — Z79899 Other long term (current) drug therapy: Secondary | ICD-10-CM | POA: Insufficient documentation

## 2020-11-08 LAB — COMPREHENSIVE METABOLIC PANEL
ALT: 23 U/L (ref 0–44)
AST: 29 U/L (ref 15–41)
Albumin: 3.4 g/dL — ABNORMAL LOW (ref 3.5–5.0)
Alkaline Phosphatase: 86 U/L (ref 38–126)
Anion gap: 7 (ref 5–15)
BUN: 16 mg/dL (ref 8–23)
CO2: 20 mmol/L — ABNORMAL LOW (ref 22–32)
Calcium: 8.3 mg/dL — ABNORMAL LOW (ref 8.9–10.3)
Chloride: 114 mmol/L — ABNORMAL HIGH (ref 98–111)
Creatinine, Ser: 0.95 mg/dL (ref 0.44–1.00)
GFR, Estimated: >60 mL/min (ref >60)
Glucose, Bld: 84 mg/dL (ref 70–99)
Potassium: 4.3 mmol/L (ref 3.5–5.1)
Sodium: 141 mmol/L (ref 135–145)
Total Bilirubin: 0.4 mg/dL (ref 0.3–1.2)
Total Protein: 6.1 g/dL — ABNORMAL LOW (ref 6.5–8.1)

## 2020-11-08 LAB — CBC WITH DIFFERENTIAL/PLATELET
Abs Immature Granulocytes: 0.01 10*3/uL (ref 0.00–0.07)
Basophils Absolute: 0 10*3/uL (ref 0.0–0.1)
Basophils Relative: 1 %
Eosinophils Absolute: 0.1 10*3/uL (ref 0.0–0.5)
Eosinophils Relative: 1 %
HCT: 32.1 % — ABNORMAL LOW (ref 36.0–46.0)
Hemoglobin: 10.3 g/dL — ABNORMAL LOW (ref 12.0–15.0)
Immature Granulocytes: 0 %
Lymphocytes Relative: 33 %
Lymphs Abs: 1.9 10*3/uL (ref 0.7–4.0)
MCH: 24.6 pg — ABNORMAL LOW (ref 26.0–34.0)
MCHC: 32.1 g/dL (ref 30.0–36.0)
MCV: 76.8 fL — ABNORMAL LOW (ref 80.0–100.0)
Monocytes Absolute: 0.5 10*3/uL (ref 0.1–1.0)
Monocytes Relative: 8 %
Neutro Abs: 3.2 10*3/uL (ref 1.7–7.7)
Neutrophils Relative %: 57 %
Platelets: 190 10*3/uL (ref 150–400)
RBC: 4.18 MIL/uL (ref 3.87–5.11)
RDW: 14.6 % (ref 11.5–15.5)
WBC: 5.7 10*3/uL (ref 4.0–10.5)
nRBC: 0 % (ref 0.0–0.2)

## 2020-11-08 MED ORDER — CYANOCOBALAMIN 1000 MCG/ML IJ SOLN
INTRAMUSCULAR | Status: AC
  Filled 2020-11-08: qty 1

## 2020-11-08 MED ORDER — CYANOCOBALAMIN 1000 MCG/ML IJ SOLN
1000.0000 ug | Freq: Once | INTRAMUSCULAR | Status: AC
  Administered 2020-11-08: 1000 ug via INTRAMUSCULAR

## 2020-11-08 NOTE — Patient Instructions (Signed)
Vitamin B12 Injection What is this medication? Vitamin B12 (VAHY tuh min B12) prevents and treats low vitamin B12 levels in your body. It is used in people who do not get enough vitamin B12 from their diet or when their digestive tract does not absorb enough. Vitamin B12 plays an important role in maintaining the health of your nervous system and red bloodcells. This medicine may be used for other purposes; ask your health care provider orpharmacist if you have questions. COMMON BRAND NAME(S): B-12 Compliance Kit, B-12 Injection Kit, Cyomin, LA-12,Nutri-Twelve, Physicians EZ Use B-12, Primabalt What should I tell my care team before I take this medication? They need to know if you have any of these conditions: Kidney disease Leber's disease Megaloblastic anemia An unusual or allergic reaction to cyanocobalamin, cobalt, other medications, foods, dyes, or preservatives Pregnant or trying to get pregnant Breast-feeding How should I use this medication? This medication is injected into a muscle or deeply under the skin. It is usually given in a clinic or care team's office. However, your care team mayteach you how to inject yourself. Follow all instructions. Talk to your care team about the use of this medication in children. Specialcare may be needed. Overdosage: If you think you have taken too much of this medicine contact apoison control center or emergency room at once. NOTE: This medicine is only for you. Do not share this medicine with others. What if I miss a dose? If you are given your dose at a clinic or care team's office, call to reschedule your appointment. If you give your own injections, and you miss a dose, take it as soon as you can. If it is almost time for your next dose, takeonly that dose. Do not take double or extra doses. What may interact with this medication? Colchicine Heavy alcohol intake This list may not describe all possible interactions. Give your health care provider  a list of all the medicines, herbs, non-prescription drugs, or dietary supplements you use. Also tell them if you smoke, drink alcohol, or use illegaldrugs. Some items may interact with your medicine. What should I watch for while using this medication? Visit your care team regularly. You may need blood work done while you aretaking this medication. You may need to follow a special diet. Talk to your care team. Limit youralcohol intake and avoid smoking to get the best benefit. What side effects may I notice from receiving this medication? Side effects that you should report to your care team as soon as possible: Allergic reactions-skin rash, itching, hives, swelling of the face, lips, tongue, or throat Swelling of the ankles, hands, or feet Trouble breathing Side effects that usually do not require medical attention (report to your careteam if they continue or are bothersome): Diarrhea This list may not describe all possible side effects. Call your doctor for medical advice about side effects. You may report side effects to FDA at1-800-FDA-1088. Where should I keep my medication? Keep out of the reach of children. Store at room temperature between 15 and 30 degrees C (59 and 85 degrees F).Protect from light. Throw away any unused medication after the expiration date. NOTE: This sheet is a summary. It may not cover all possible information. If you have questions about this medicine, talk to your doctor, pharmacist, orhealth care provider.  2022 Elsevier/Gold Standard (2020-06-10 11:47:06)  
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
## 2020-11-10 ENCOUNTER — Encounter: Payer: Self-pay | Admitting: Hematology

## 2020-11-12 ENCOUNTER — Telehealth: Payer: Self-pay | Admitting: Hematology

## 2020-11-12 NOTE — Telephone Encounter (Signed)
Scheduled follow-up appointments per 7/8 los. Patient is aware.

## 2020-11-13 ENCOUNTER — Encounter: Payer: Self-pay | Admitting: Podiatry

## 2020-11-13 ENCOUNTER — Ambulatory Visit (INDEPENDENT_AMBULATORY_CARE_PROVIDER_SITE_OTHER): Payer: Medicare Other | Admitting: Podiatry

## 2020-11-13 ENCOUNTER — Other Ambulatory Visit: Payer: Self-pay

## 2020-11-13 DIAGNOSIS — M79675 Pain in left toe(s): Secondary | ICD-10-CM

## 2020-11-13 DIAGNOSIS — N179 Acute kidney failure, unspecified: Secondary | ICD-10-CM | POA: Diagnosis not present

## 2020-11-13 DIAGNOSIS — M79674 Pain in right toe(s): Secondary | ICD-10-CM

## 2020-11-13 DIAGNOSIS — E0842 Diabetes mellitus due to underlying condition with diabetic polyneuropathy: Secondary | ICD-10-CM | POA: Diagnosis not present

## 2020-11-13 DIAGNOSIS — B351 Tinea unguium: Secondary | ICD-10-CM | POA: Diagnosis not present

## 2020-11-13 NOTE — Progress Notes (Signed)
This patient returns to my office for at risk foot care.  This patient requires this care by a professional since this patient will be at risk due to having  Diabetes.  This patient is unable to cut nails herself since the patient cannot reach her nails.These nails are painful walking and wearing shoes.  This patient presents for at risk foot care today.   General Appearance  Alert, conversant and in no acute stress.  Vascular  Dorsalis pedis and posterior tibial  pulses are palpable  bilaterally.  Capillary return is within normal limits  bilaterally. Temperature is within normal limits  bilaterally.  Neurologic  Senn-Weinstein monofilament wire test absent   bilaterally. Muscle power within normal limits bilaterally.  Nails Thick disfigured discolored nails with subungual debris  from hallux to fifth toes bilaterally. No evidence of bacterial infection or drainage bilaterally.  Orthopedic  No limitations of motion  feet .  No crepitus or effusions noted.  No bony pathology or digital deformities noted.  Skin  normotropic skin with no porokeratosis noted bilaterally.  No signs of infections or ulcers noted.   Peeling noted plantar right heel.  Onychomycosis  Pain in right toes  Pain in left toes  Consent was obtained for treatment procedures.   Mechanical debridement of nails 1-5  bilaterally performed with a nail nipper.  Filed with dremel without incident.  Discussed the peeling with this patient.  Since the amlactin is not working I recommended she try cortaid  1%.    Return office visit   3 months                   Told patient to return for periodic foot care and evaluation due to potential at risk complications.   Gardiner Barefoot DPM

## 2020-11-19 ENCOUNTER — Encounter: Payer: Self-pay | Admitting: Gastroenterology

## 2020-11-19 ENCOUNTER — Ambulatory Visit (INDEPENDENT_AMBULATORY_CARE_PROVIDER_SITE_OTHER): Payer: Medicare Other | Admitting: Gastroenterology

## 2020-11-19 VITALS — BP 120/70 | HR 68 | Ht 62.0 in | Wt 108.0 lb

## 2020-11-19 DIAGNOSIS — R109 Unspecified abdominal pain: Secondary | ICD-10-CM | POA: Diagnosis not present

## 2020-11-19 DIAGNOSIS — G8929 Other chronic pain: Secondary | ICD-10-CM

## 2020-11-19 MED ORDER — GI COCKTAIL ~~LOC~~: ORAL | 2 refills | Status: DC
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
## 2020-11-19 NOTE — Patient Instructions (Addendum)
Take IB and FD Donald Prose three times a day as needed  We will refill your GI Cocktail for 1 year  Follow up as needed  Due to recent changes in healthcare laws, you may see the results of your imaging and laboratory studies on MyChart before your provider has had a chance to review them.  We understand that in some cases there may be results that are confusing or concerning to you. Not all laboratory results come back in the same time frame and the provider may be waiting for multiple results in order to interpret others.  Please give [place] 48 hours in order for your provider to thoroughly review all the results before contacting the office for clarification of your results.    If you are age 5 or older, your body mass index should be between 23-30. Your Body mass index is 19.75 kg/m. If this is out of the aforementioned range listed, please consider follow up with your Primary Care Provider.  If you are age 31 or younger, your body mass index should be between 19-25. Your Body mass index is 19.75 kg/m. If this is out of the aformentioned range listed, please consider follow up with your Primary Care Provider.   __________________________________________________________  The [facility] providers would like to encourage you to use [facility] to communicate with providers for non-urgent requests or questions.  Due to long hold times on the telephone, sending your provider a message by [facility] may be a faster and more efficient way to get a response.  Please allow 48 business hours for a response.  Please remember that this is for non-urgent requests.    I appreciate the  opportunity to care for you  Thank You   Harl Bowie , MD

## 2020-11-20 ENCOUNTER — Telehealth: Payer: Self-pay | Admitting: Gastroenterology

## 2020-11-20 NOTE — Telephone Encounter (Signed)
Ok, she will need to contact her PMD and pain management clinic. We have called pain management clinic and requested for her to be seen sooner. Thanks

## 2020-11-20 NOTE — Telephone Encounter (Signed)
Larna Daughters called from [facility] to inform Dr. Silverio Decamp that the patient has 8/10 pain today as she did her physical therapy

## 2020-11-22 ENCOUNTER — Ambulatory Visit: Payer: Medicare Other

## 2020-11-22 ENCOUNTER — Other Ambulatory Visit: Payer: Self-pay

## 2020-11-22 ENCOUNTER — Inpatient Hospital Stay: Payer: Medicare Other

## 2020-11-22 VITALS — BP 178/86 | HR 86 | Temp 98.2°F | Resp 19

## 2020-11-22 DIAGNOSIS — D519 Vitamin B12 deficiency anemia, unspecified: Secondary | ICD-10-CM

## 2020-11-22 MED ORDER — SODIUM CHLORIDE 0.9 % IV SOLN
300.0000 mg | Freq: Once | INTRAVENOUS | Status: AC
  Administered 2020-11-22: 300 mg via INTRAVENOUS
  Filled 2020-11-22: qty 300

## 2020-11-22 MED ORDER — SODIUM CHLORIDE 0.9 % IV SOLN
Freq: Once | INTRAVENOUS | Status: AC
  Filled 2020-11-22: qty 250

## 2020-11-22 MED ORDER — CYANOCOBALAMIN 1000 MCG/ML IJ SOLN
1000.0000 ug | Freq: Once | INTRAMUSCULAR | Status: AC
  Administered 2020-11-22: 1000 ug via INTRAMUSCULAR

## 2020-11-22 MED ORDER — ACETAMINOPHEN 325 MG PO TABS
ORAL_TABLET | ORAL | Status: AC
  Filled 2020-11-22: qty 2

## 2020-11-22 MED ORDER — ACETAMINOPHEN 325 MG PO TABS
650.0000 mg | ORAL_TABLET | Freq: Once | ORAL | Status: AC
Start: 2020-11-22 — End: 2020-11-22
  Administered 2020-11-22: 650 mg via ORAL

## 2020-11-22 NOTE — Patient Instructions (Signed)

## 2020-11-22 NOTE — Progress Notes (Signed)
Pt declined 30 min wait VSS at discharge

## 2020-11-22 NOTE — Progress Notes (Signed)
Patient began to experience discomfort around proximal area to IV site after 30-minutes of receiving iron infusion. RN assessed area and removed IV after aspirating contents. Cool pack placed over area in case of extravasation.  Second IV started and iron infusion restarted at new site. Dr. Burr Medico came to bedside to assess patient for any signs of extravasation. Dr. Burr Medico instructed to assess site for any signs of redness, swelling, or discomfort, however patient did not need to undergo extravasation protocol at this time. At this time, patient began to express discomfort at insertion site of second IV. Dr. Burr Medico stated that patient could have 3rd IV placed, but if discomfort continued again, iron infusion could be stopped. Pt agreeable to continuing with infusion as long as possible. IV team called to establish 3rd IV site. 2nd IV removed.

## 2020-11-28 ENCOUNTER — Other Ambulatory Visit: Payer: Self-pay

## 2020-11-28 ENCOUNTER — Encounter
Payer: Medicare Other | Attending: Physical Medicine and Rehabilitation | Admitting: Physical Medicine and Rehabilitation

## 2020-11-28 DIAGNOSIS — R1084 Generalized abdominal pain: Secondary | ICD-10-CM | POA: Diagnosis not present

## 2020-11-28 MED ORDER — HYDROCODONE-ACETAMINOPHEN 5-325 MG PO TABS: 1.0000 | ORAL_TABLET | Freq: Two times a day (BID) | ORAL | 0 refills | Status: DC | PRN
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
## 2020-11-29 ENCOUNTER — Telehealth: Payer: Self-pay | Admitting: *Deleted

## 2020-11-29 NOTE — Telephone Encounter (Addendum)
Prior auth for hydrocodone 5/325 #60 initiated with [employer] medicare. Approval received through 12/29/20. Mrs [facility] notified.

## 2020-12-03 NOTE — Telephone Encounter (Signed)
Certification and plan of care signed, faxed back to [id number].

## 2020-12-06 ENCOUNTER — Encounter
Payer: Medicare Other | Attending: Physical Medicine and Rehabilitation | Admitting: Physical Medicine and Rehabilitation

## 2020-12-06 ENCOUNTER — Inpatient Hospital Stay: Payer: Medicare Other | Attending: Hematology

## 2020-12-06 ENCOUNTER — Other Ambulatory Visit: Payer: Self-pay

## 2020-12-06 ENCOUNTER — Ambulatory Visit: Payer: Medicare Other

## 2020-12-06 VITALS — BP 96/50 | HR 69 | Temp 98.9°F | Ht 62.0 in | Wt 106.2 lb

## 2020-12-06 DIAGNOSIS — R112 Nausea with vomiting, unspecified: Secondary | ICD-10-CM | POA: Diagnosis not present

## 2020-12-06 DIAGNOSIS — D519 Vitamin B12 deficiency anemia, unspecified: Secondary | ICD-10-CM | POA: Diagnosis not present

## 2020-12-06 DIAGNOSIS — R1084 Generalized abdominal pain: Secondary | ICD-10-CM | POA: Diagnosis not present

## 2020-12-06 MED ORDER — CYANOCOBALAMIN 1000 MCG/ML IJ SOLN
1000.0000 ug | Freq: Once | INTRAMUSCULAR | Status: AC
  Administered 2020-12-06: 1000 ug via INTRAMUSCULAR

## 2020-12-06 MED ORDER — CYANOCOBALAMIN 1000 MCG/ML IJ SOLN
INTRAMUSCULAR | Status: AC
  Filled 2020-12-06: qty 1

## 2020-12-06 NOTE — Patient Instructions (Signed)
Vitamin B12 Injection What is this medication? Vitamin B12 (VAHY tuh min B12) prevents and treats low vitamin B12 levels in your body. It is used in people who do not get enough vitamin B12 from their diet or when their digestive tract does not absorb enough. Vitamin B12 plays an important role in maintaining the health of your nervous system and red bloodcells. This medicine may be used for other purposes; ask your health care provider orpharmacist if you have questions. COMMON BRAND NAME(S): B-12 Compliance Kit, B-12 Injection Kit, Cyomin, LA-12,Nutri-Twelve, Physicians EZ Use B-12, Primabalt What should I tell my care team before I take this medication? They need to know if you have any of these conditions: Kidney disease Leber's disease Megaloblastic anemia An unusual or allergic reaction to cyanocobalamin, cobalt, other medications, foods, dyes, or preservatives Pregnant or trying to get pregnant Breast-feeding How should I use this medication? This medication is injected into a muscle or deeply under the skin. It is usually given in a clinic or care team's office. However, your care team mayteach you how to inject yourself. Follow all instructions. Talk to your care team about the use of this medication in children. Specialcare may be needed. Overdosage: If you think you have taken too much of this medicine contact apoison control center or emergency room at once. NOTE: This medicine is only for you. Do not share this medicine with others. What if I miss a dose? If you are given your dose at a clinic or care team's office, call to reschedule your appointment. If you give your own injections, and you miss a dose, take it as soon as you can. If it is almost time for your next dose, takeonly that dose. Do not take double or extra doses. What may interact with this medication? Colchicine Heavy alcohol intake This list may not describe all possible interactions. Give your health care provider  a list of all the medicines, herbs, non-prescription drugs, or dietary supplements you use. Also tell them if you smoke, drink alcohol, or use illegaldrugs. Some items may interact with your medicine. What should I watch for while using this medication? Visit your care team regularly. You may need blood work done while you aretaking this medication. You may need to follow a special diet. Talk to your care team. Limit youralcohol intake and avoid smoking to get the best benefit. What side effects may I notice from receiving this medication? Side effects that you should report to your care team as soon as possible: Allergic reactions-skin rash, itching, hives, swelling of the face, lips, tongue, or throat Swelling of the ankles, hands, or feet Trouble breathing Side effects that usually do not require medical attention (report to your careteam if they continue or are bothersome): Diarrhea This list may not describe all possible side effects. Call your doctor for medical advice about side effects. You may report side effects to FDA at1-800-FDA-1088. Where should I keep my medication? Keep out of the reach of children. Store at room temperature between 15 and 30 degrees C (59 and 85 degrees F).Protect from light. Throw away any unused medication after the expiration date. NOTE: This sheet is a summary. It may not cover all possible information. If you have questions about this medicine, talk to your doctor, pharmacist, orhealth care provider.  2022 Elsevier/Gold Standard (2020-06-10 11:47:06)  
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
## 2020-12-09 ENCOUNTER — Other Ambulatory Visit: Payer: Medicare Other | Admitting: Hospice

## 2020-12-09 ENCOUNTER — Other Ambulatory Visit: Payer: Self-pay

## 2020-12-09 DIAGNOSIS — D519 Vitamin B12 deficiency anemia, unspecified: Secondary | ICD-10-CM

## 2020-12-09 DIAGNOSIS — Z515 Encounter for palliative care: Secondary | ICD-10-CM

## 2020-12-09 DIAGNOSIS — R269 Unspecified abnormalities of gait and mobility: Secondary | ICD-10-CM

## 2020-12-09 DIAGNOSIS — G8929 Other chronic pain: Secondary | ICD-10-CM

## 2020-12-09 DIAGNOSIS — R109 Unspecified abdominal pain: Secondary | ICD-10-CM
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
## 2020-12-16 ENCOUNTER — Encounter (HOSPITAL_BASED_OUTPATIENT_CLINIC_OR_DEPARTMENT_OTHER): Payer: Medicare Other | Admitting: Physical Medicine and Rehabilitation

## 2020-12-16 ENCOUNTER — Other Ambulatory Visit: Payer: Self-pay

## 2020-12-16 DIAGNOSIS — R1084 Generalized abdominal pain: Secondary | ICD-10-CM

## 2020-12-16 IMAGING — MG DIGITAL SCREENING BILAT W/ TOMO W/ CAD
8 series · 8 of 24 positions shown · non-contrast
Comparison: Previous exam(s).

CLINICAL DATA: Screening.

EXAM:
DIGITAL SCREENING BILATERAL MAMMOGRAM WITH TOMO AND CAD

[R MLO synth-2D]
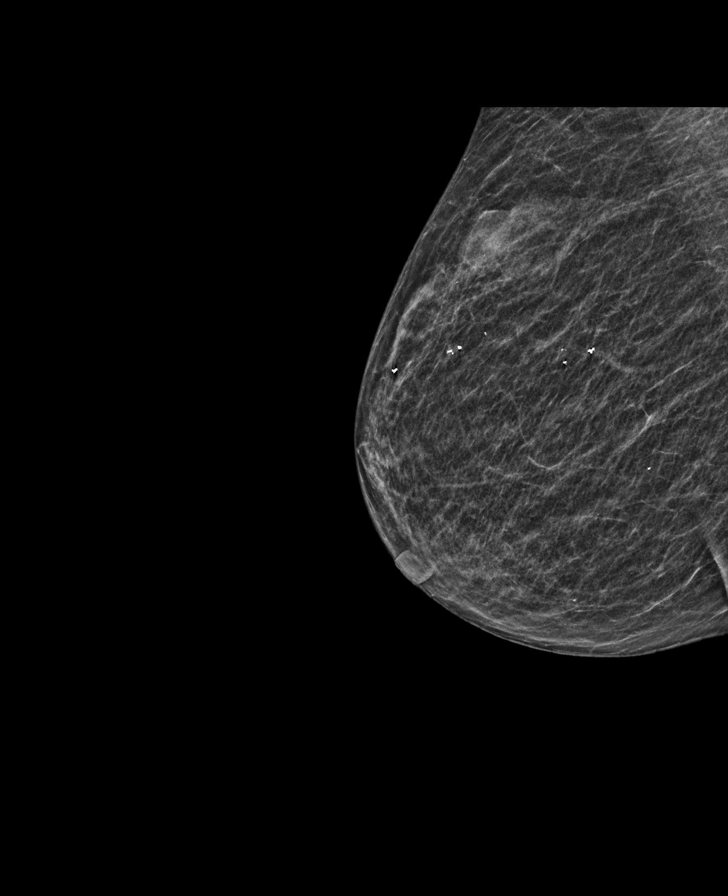

[L MLO synth-2D]
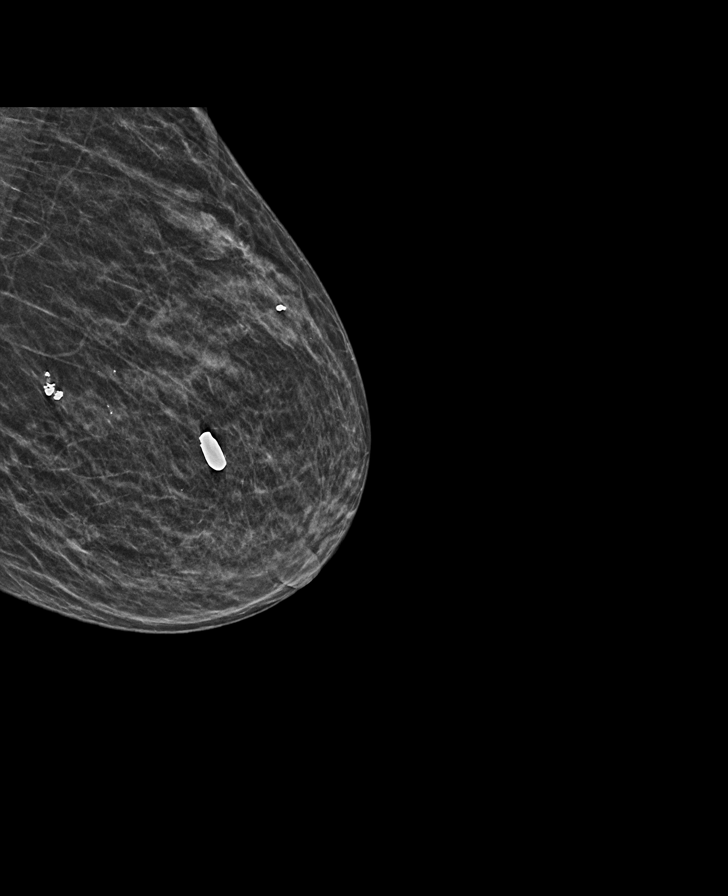

[R CC synth-2D]
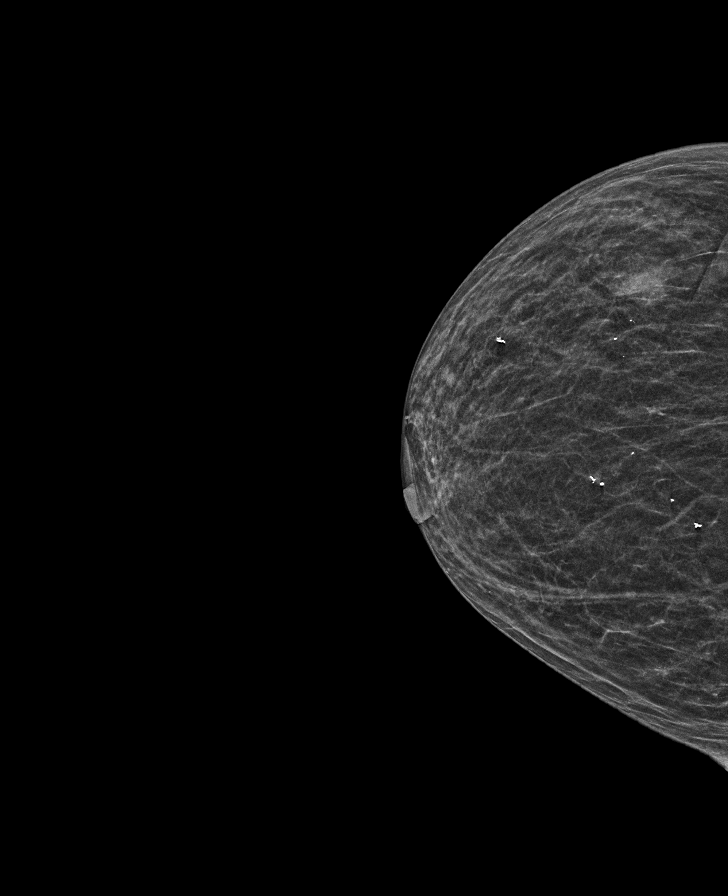

[L CC synth-2D]
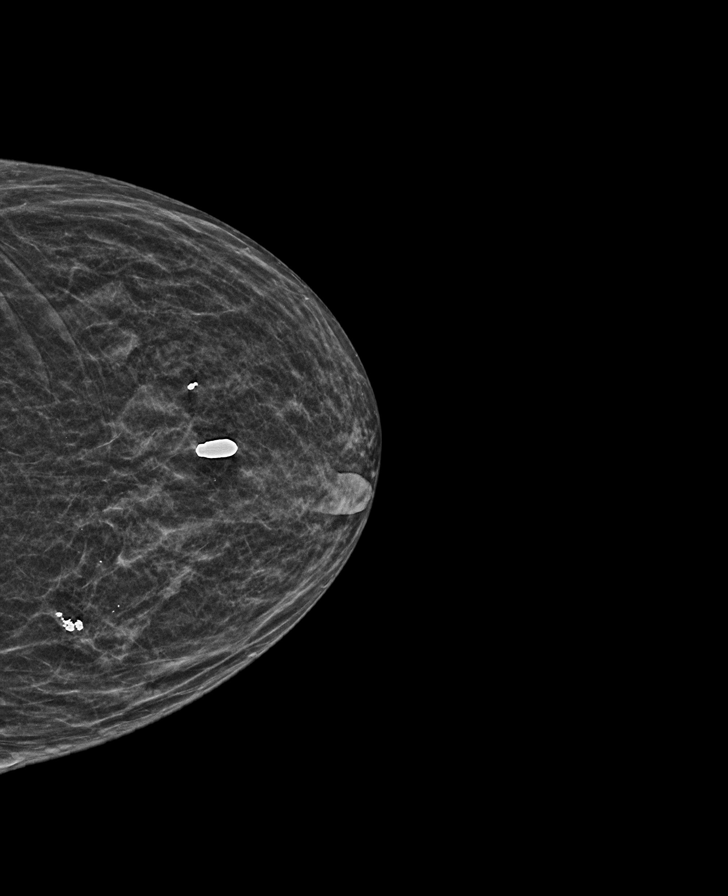

[L CC tomo · tomo slice 19/37.0]
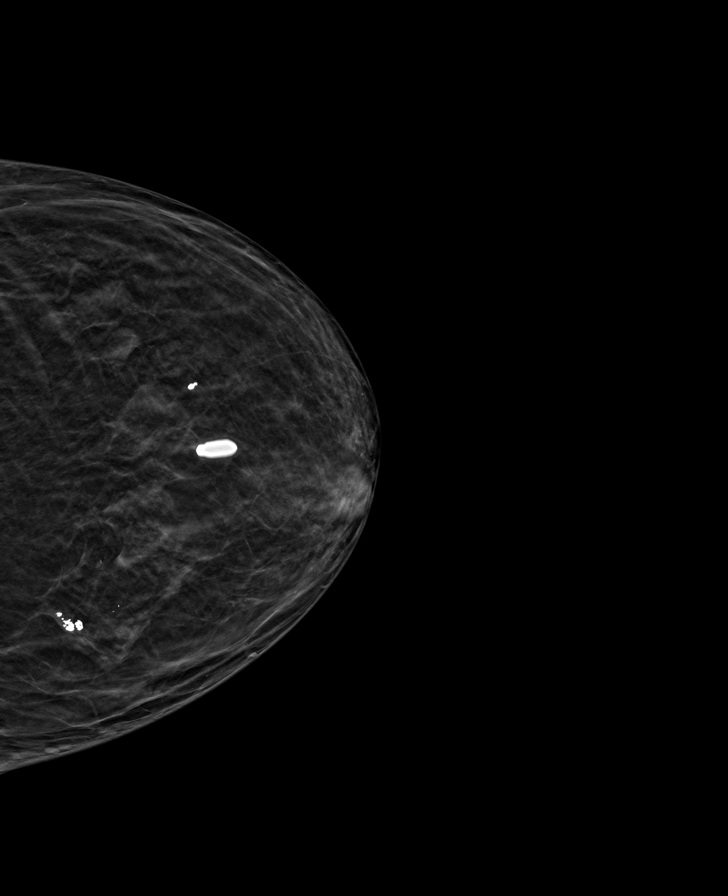

[R MLO tomo · tomo slice 20/39.0]
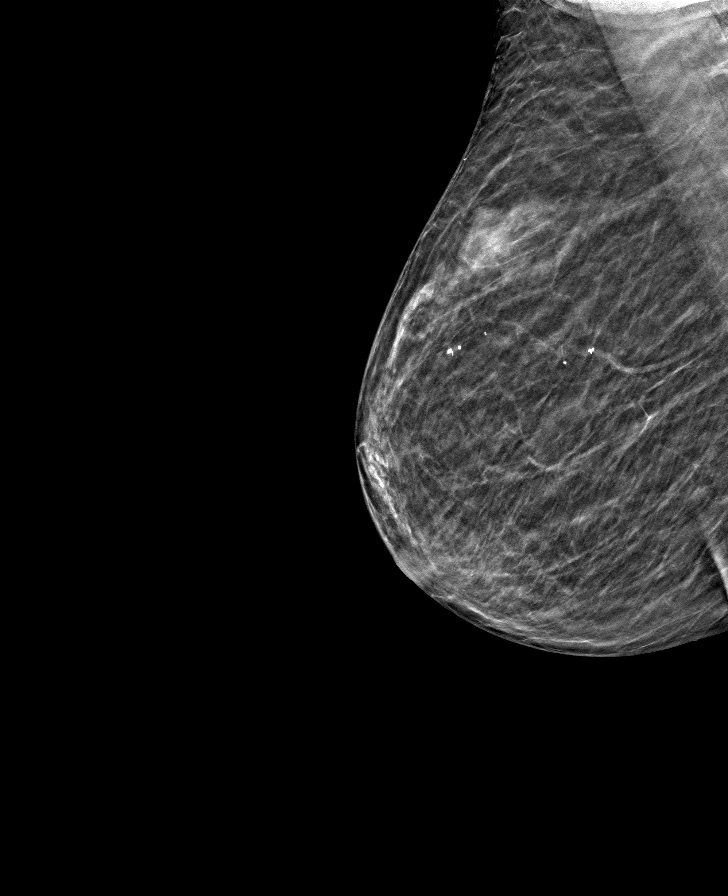

[L MLO tomo · tomo slice 20/39.0]
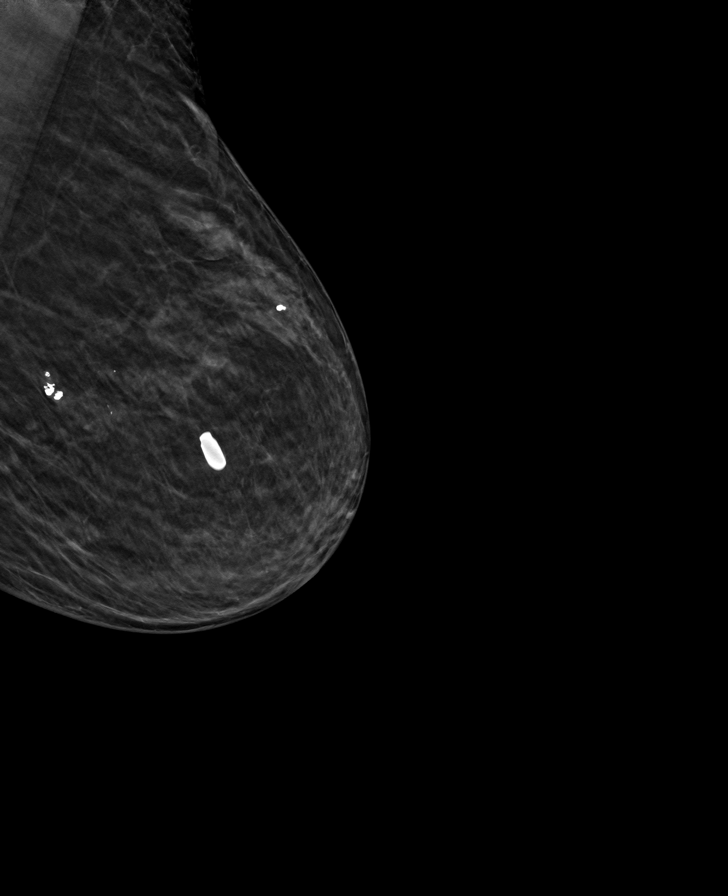

[R CC tomo · tomo slice 19/38.0]
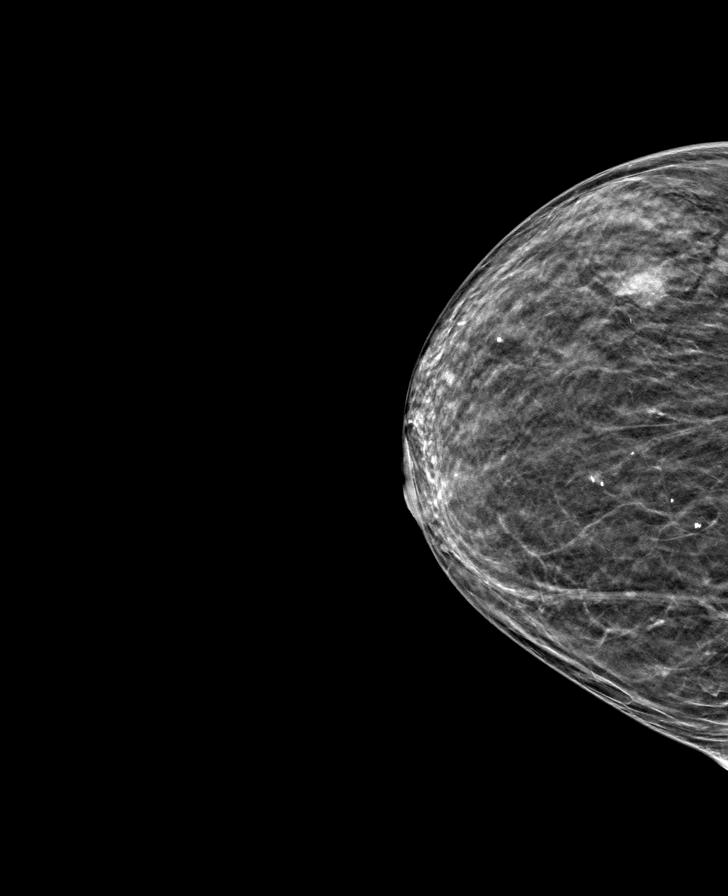

[8 of 24 positions shown; findings below may reference images not displayed]

ACR Breast Density Category b: There are scattered areas of
fibroglandular density.
FINDINGS: There are no findings suspicious for malignancy. Images were
processed with CAD.
IMPRESSION: No mammographic evidence of malignancy. A result letter of this
screening mammogram will be mailed directly to the patient.

RECOMMENDATION:
Screening mammogram in one year. (Code:CN-U-775)

BI-RADS CATEGORY  1: Negative.

## 2020-12-16 MED ORDER — PROMETHAZINE HCL 12.5 MG PO TABS: 12.5000 mg | ORAL_TABLET | ORAL | 0 refills | Status: DC | PRN
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
## 2020-12-19 ENCOUNTER — Other Ambulatory Visit: Payer: Self-pay

## 2020-12-19 ENCOUNTER — Encounter (HOSPITAL_BASED_OUTPATIENT_CLINIC_OR_DEPARTMENT_OTHER): Payer: Medicare Other | Admitting: Physical Medicine and Rehabilitation

## 2020-12-19 DIAGNOSIS — R1084 Generalized abdominal pain: Secondary | ICD-10-CM | POA: Diagnosis not present
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
## 2020-12-20 ENCOUNTER — Inpatient Hospital Stay: Payer: Medicare Other

## 2020-12-20 DIAGNOSIS — D519 Vitamin B12 deficiency anemia, unspecified: Secondary | ICD-10-CM

## 2020-12-20 MED ORDER — CYANOCOBALAMIN 1000 MCG/ML IJ SOLN
1000.0000 ug | Freq: Once | INTRAMUSCULAR | Status: AC
  Administered 2020-12-20: 1000 ug via INTRAMUSCULAR
  Filled 2020-12-20: qty 1

## 2021-01-03 ENCOUNTER — Inpatient Hospital Stay: Payer: Medicare Other

## 2021-01-03 ENCOUNTER — Inpatient Hospital Stay: Payer: Medicare Other | Attending: Hematology

## 2021-01-03 ENCOUNTER — Other Ambulatory Visit: Payer: Medicare Other

## 2021-01-03 ENCOUNTER — Telehealth: Payer: Self-pay | Admitting: *Deleted

## 2021-01-03 ENCOUNTER — Other Ambulatory Visit: Payer: Self-pay | Admitting: Gastroenterology

## 2021-01-03 ENCOUNTER — Other Ambulatory Visit: Payer: Self-pay | Admitting: Physical Medicine and Rehabilitation

## 2021-01-03 ENCOUNTER — Ambulatory Visit: Payer: Medicare Other

## 2021-01-03 ENCOUNTER — Other Ambulatory Visit: Payer: Self-pay

## 2021-01-03 DIAGNOSIS — D519 Vitamin B12 deficiency anemia, unspecified: Secondary | ICD-10-CM

## 2021-01-03 DIAGNOSIS — C7A8 Other malignant neuroendocrine tumors: Secondary | ICD-10-CM

## 2021-01-03 DIAGNOSIS — D649 Anemia, unspecified: Secondary | ICD-10-CM

## 2021-01-03 LAB — CBC WITH DIFFERENTIAL/PLATELET
Abs Immature Granulocytes: 0.01 10*3/uL (ref 0.00–0.07)
Basophils Absolute: 0 10*3/uL (ref 0.0–0.1)
Basophils Relative: 1 %
Eosinophils Absolute: 0 10*3/uL (ref 0.0–0.5)
Eosinophils Relative: 0 %
HCT: 32 % — ABNORMAL LOW (ref 36.0–46.0)
Hemoglobin: 10.7 g/dL — ABNORMAL LOW (ref 12.0–15.0)
Immature Granulocytes: 0 %
Lymphocytes Relative: 24 %
Lymphs Abs: 1.6 10*3/uL (ref 0.7–4.0)
MCH: 25.1 pg — ABNORMAL LOW (ref 26.0–34.0)
MCHC: 33.4 g/dL (ref 30.0–36.0)
MCV: 74.9 fL — ABNORMAL LOW (ref 80.0–100.0)
Monocytes Absolute: 0.4 10*3/uL (ref 0.1–1.0)
Monocytes Relative: 6 %
Neutro Abs: 4.6 10*3/uL (ref 1.7–7.7)
Neutrophils Relative %: 69 %
Platelets: 175 10*3/uL (ref 150–400)
RBC: 4.27 MIL/uL (ref 3.87–5.11)
RDW: 14.5 % (ref 11.5–15.5)
WBC: 6.6 10*3/uL (ref 4.0–10.5)
nRBC: 0 % (ref 0.0–0.2)

## 2021-01-03 LAB — IRON AND TIBC
Iron: 105 ug/dL (ref 41–142)
Saturation Ratios: 41 % (ref 21–57)
TIBC: 255 ug/dL (ref 236–444)
UIBC: 150 ug/dL (ref 120–384)

## 2021-01-03 LAB — FERRITIN: Ferritin: 118 ng/mL (ref 11–307)

## 2021-01-03 MED ORDER — HYDROCODONE-ACETAMINOPHEN 5-325 MG PO TABS: 1.0000 | ORAL_TABLET | Freq: Two times a day (BID) | ORAL | 0 refills | Status: DC | PRN

## 2021-01-03 MED ORDER — CYANOCOBALAMIN 1000 MCG/ML IJ SOLN
1000.0000 ug | Freq: Once | INTRAMUSCULAR | Status: AC
  Administered 2021-01-03: 1000 ug via INTRAMUSCULAR
  Filled 2021-01-03: qty 1

## 2021-01-03 NOTE — Telephone Encounter (Signed)
Emily [name] called for a refill on her hydrocodone. Her last fill date was 11/29/20 and her next appt is 03/14/21.

## 2021-01-05 ENCOUNTER — Other Ambulatory Visit: Payer: Self-pay | Admitting: Gastroenterology

## 2021-01-07 LAB — CHROMOGRANIN A: Chromogranin A (ng/mL): 90.1 ng/mL (ref 0.0–101.8)

## 2021-01-08 ENCOUNTER — Other Ambulatory Visit: Payer: Self-pay

## 2021-01-08 ENCOUNTER — Encounter
Payer: Medicare Other | Attending: Physical Medicine and Rehabilitation | Admitting: Physical Medicine and Rehabilitation

## 2021-01-08 DIAGNOSIS — K580 Irritable bowel syndrome with diarrhea: Secondary | ICD-10-CM | POA: Diagnosis not present

## 2021-01-08 DIAGNOSIS — R112 Nausea with vomiting, unspecified: Secondary | ICD-10-CM | POA: Diagnosis not present

## 2021-01-08 DIAGNOSIS — R1084 Generalized abdominal pain: Secondary | ICD-10-CM | POA: Diagnosis not present

## 2021-01-08 DIAGNOSIS — R0781 Pleurodynia: Secondary | ICD-10-CM | POA: Insufficient documentation

## 2021-01-08 LAB — METHYLMALONIC ACID, SERUM: Methylmalonic Acid, Quantitative: 188 nmol/L (ref 0–378)

## 2021-01-08 MED ORDER — DULOXETINE HCL 20 MG PO CPEP: 20.0000 mg | ORAL_CAPSULE | Freq: Every day | ORAL | 2 refills | Status: DC
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
## 2021-01-10 ENCOUNTER — Encounter
Payer: Medicare Other | Attending: Physical Medicine and Rehabilitation | Admitting: Physical Medicine and Rehabilitation

## 2021-01-10 ENCOUNTER — Other Ambulatory Visit: Payer: Self-pay

## 2021-01-10 DIAGNOSIS — R112 Nausea with vomiting, unspecified: Secondary | ICD-10-CM

## 2021-01-10 DIAGNOSIS — R1084 Generalized abdominal pain: Secondary | ICD-10-CM | POA: Diagnosis not present
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
## 2021-01-14 ENCOUNTER — Ambulatory Visit: Payer: Medicare Other | Admitting: Physical Medicine and Rehabilitation

## 2021-01-16 ENCOUNTER — Other Ambulatory Visit: Payer: Self-pay | Admitting: Physical Medicine and Rehabilitation

## 2021-01-17 ENCOUNTER — Inpatient Hospital Stay: Payer: Medicare Other

## 2021-01-17 ENCOUNTER — Other Ambulatory Visit: Payer: Self-pay

## 2021-01-17 DIAGNOSIS — D519 Vitamin B12 deficiency anemia, unspecified: Secondary | ICD-10-CM

## 2021-01-17 MED ORDER — CYANOCOBALAMIN 1000 MCG/ML IJ SOLN
1000.0000 ug | Freq: Once | INTRAMUSCULAR | Status: AC
  Administered 2021-01-17: 1000 ug via INTRAMUSCULAR
  Filled 2021-01-17: qty 1

## 2021-01-28 ENCOUNTER — Other Ambulatory Visit: Payer: Self-pay

## 2021-01-28 ENCOUNTER — Encounter (HOSPITAL_BASED_OUTPATIENT_CLINIC_OR_DEPARTMENT_OTHER): Payer: Medicare Other | Admitting: Physical Medicine and Rehabilitation

## 2021-01-28 DIAGNOSIS — R0781 Pleurodynia: Secondary | ICD-10-CM

## 2021-01-28 DIAGNOSIS — R112 Nausea with vomiting, unspecified: Secondary | ICD-10-CM

## 2021-01-28 MED ORDER — PROCHLORPERAZINE MALEATE 10 MG PO TABS: 10.0000 mg | ORAL_TABLET | Freq: Four times a day (QID) | ORAL | 0 refills | Status: DC | PRN
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
## 2021-01-28 NOTE — Addendum Note (Signed)
Addended by: Izora Ribas on: 01/28/2021 07:17 PM   Modules accepted: Level of Service

## 2021-01-31 ENCOUNTER — Inpatient Hospital Stay: Payer: Medicare Other

## 2021-02-04 ENCOUNTER — Telehealth: Payer: Self-pay | Admitting: Gastroenterology

## 2021-02-04 ENCOUNTER — Ambulatory Visit: Payer: Medicare Other | Admitting: Registered Nurse

## 2021-02-04 NOTE — Telephone Encounter (Signed)
Pt. Called stating her pain management doctor wanted her to call and get some suggestions/recommendations for her medication.  She has been taking Promethazine, but it doesn't seem to work for her any longer.  Wants to know if there is another medication she can try or if the dosage needs to be increased for the Promethazine.

## 2021-02-04 NOTE — Telephone Encounter (Signed)
Her pain management doctor saw her 01/28/21. Note is in patient's medical record. He refilled the phenergan and told her the Cymbalta may cause her nausea but that it should get better after the first couple of weeks. He gave her guidance and recommendations on foods that can help control nausea. Patient wants to know if you recommend a different medication or approach.

## 2021-02-04 NOTE — Telephone Encounter (Signed)
Continue with current recommendation as per pain management doctor.  Thank you

## 2021-02-05 ENCOUNTER — Encounter: Payer: Medicare Other | Attending: Physical Medicine and Rehabilitation | Admitting: Registered Nurse

## 2021-02-05 ENCOUNTER — Other Ambulatory Visit: Payer: Self-pay

## 2021-02-05 ENCOUNTER — Other Ambulatory Visit: Payer: Self-pay | Admitting: Physical Medicine and Rehabilitation

## 2021-02-05 ENCOUNTER — Ambulatory Visit
Admission: RE | Admit: 2021-02-05 | Discharge: 2021-02-05 | Disposition: A | Discharge status: Home / Self Care | Payer: Medicare Other | Source: Ambulatory Visit | Attending: Physical Medicine and Rehabilitation | Admitting: Physical Medicine and Rehabilitation

## 2021-02-05 VITALS — BP 151/80 | HR 78 | Temp 98.1°F | Ht 62.0 in | Wt 102.0 lb

## 2021-02-05 DIAGNOSIS — M47816 Spondylosis without myelopathy or radiculopathy, lumbar region: Secondary | ICD-10-CM | POA: Diagnosis present

## 2021-02-05 DIAGNOSIS — C7A8 Other malignant neuroendocrine tumors: Secondary | ICD-10-CM | POA: Insufficient documentation

## 2021-02-05 DIAGNOSIS — R0781 Pleurodynia: Secondary | ICD-10-CM | POA: Insufficient documentation

## 2021-02-05 DIAGNOSIS — Z5181 Encounter for therapeutic drug level monitoring: Secondary | ICD-10-CM | POA: Insufficient documentation

## 2021-02-05 DIAGNOSIS — Z79891 Long term (current) use of opiate analgesic: Secondary | ICD-10-CM | POA: Diagnosis present

## 2021-02-05 DIAGNOSIS — R1084 Generalized abdominal pain: Secondary | ICD-10-CM | POA: Diagnosis present

## 2021-02-05 DIAGNOSIS — G894 Chronic pain syndrome: Secondary | ICD-10-CM | POA: Diagnosis present

## 2021-02-05 DIAGNOSIS — R42 Dizziness and giddiness: Secondary | ICD-10-CM | POA: Insufficient documentation

## 2021-02-05 MED ORDER — HYDROCODONE-ACETAMINOPHEN 7.5-325 MG PO TABS: 1.0000 | ORAL_TABLET | Freq: Two times a day (BID) | ORAL | 0 refills | Status: DC | PRN

## 2021-02-05 NOTE — Telephone Encounter (Signed)
Spoke with the patient. She is advised and agrees to this plan of care.
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
## 2021-02-09 IMAGING — CR DG UGI W/ SMALL BOWEL
1 series · 1 of 1 positions shown · non-contrast
Comparison: Upper GI series with small-bowel follow-through
02/04/2018, CT abdomen/pelvis 08/31/2018

CLINICAL DATA: Generalized abdominal pain. Constipation. Additional
history provided by patient: Patient reports abdominal pain greatest
within lower abdomen.

EXAM:
UPPER GI SERIES WITH SMALL BOWEL FOLLOW-THROUGH
FLUOROSCOPY TIME:  Fluoroscopy Time:  2.6 minute
Radiation Exposure Index (if provided by the fluoroscopic device):
33.90 mGY
Number of Acquired Spot Images: 0
TECHNIQUE: Combined double contrast and single contrast upper GI series using
effervescent crystals, thick barium, and thin barium. Subsequently,
serial images of the small bowel were obtained including spot views
of the terminal ileum.

[t abdomen supine]
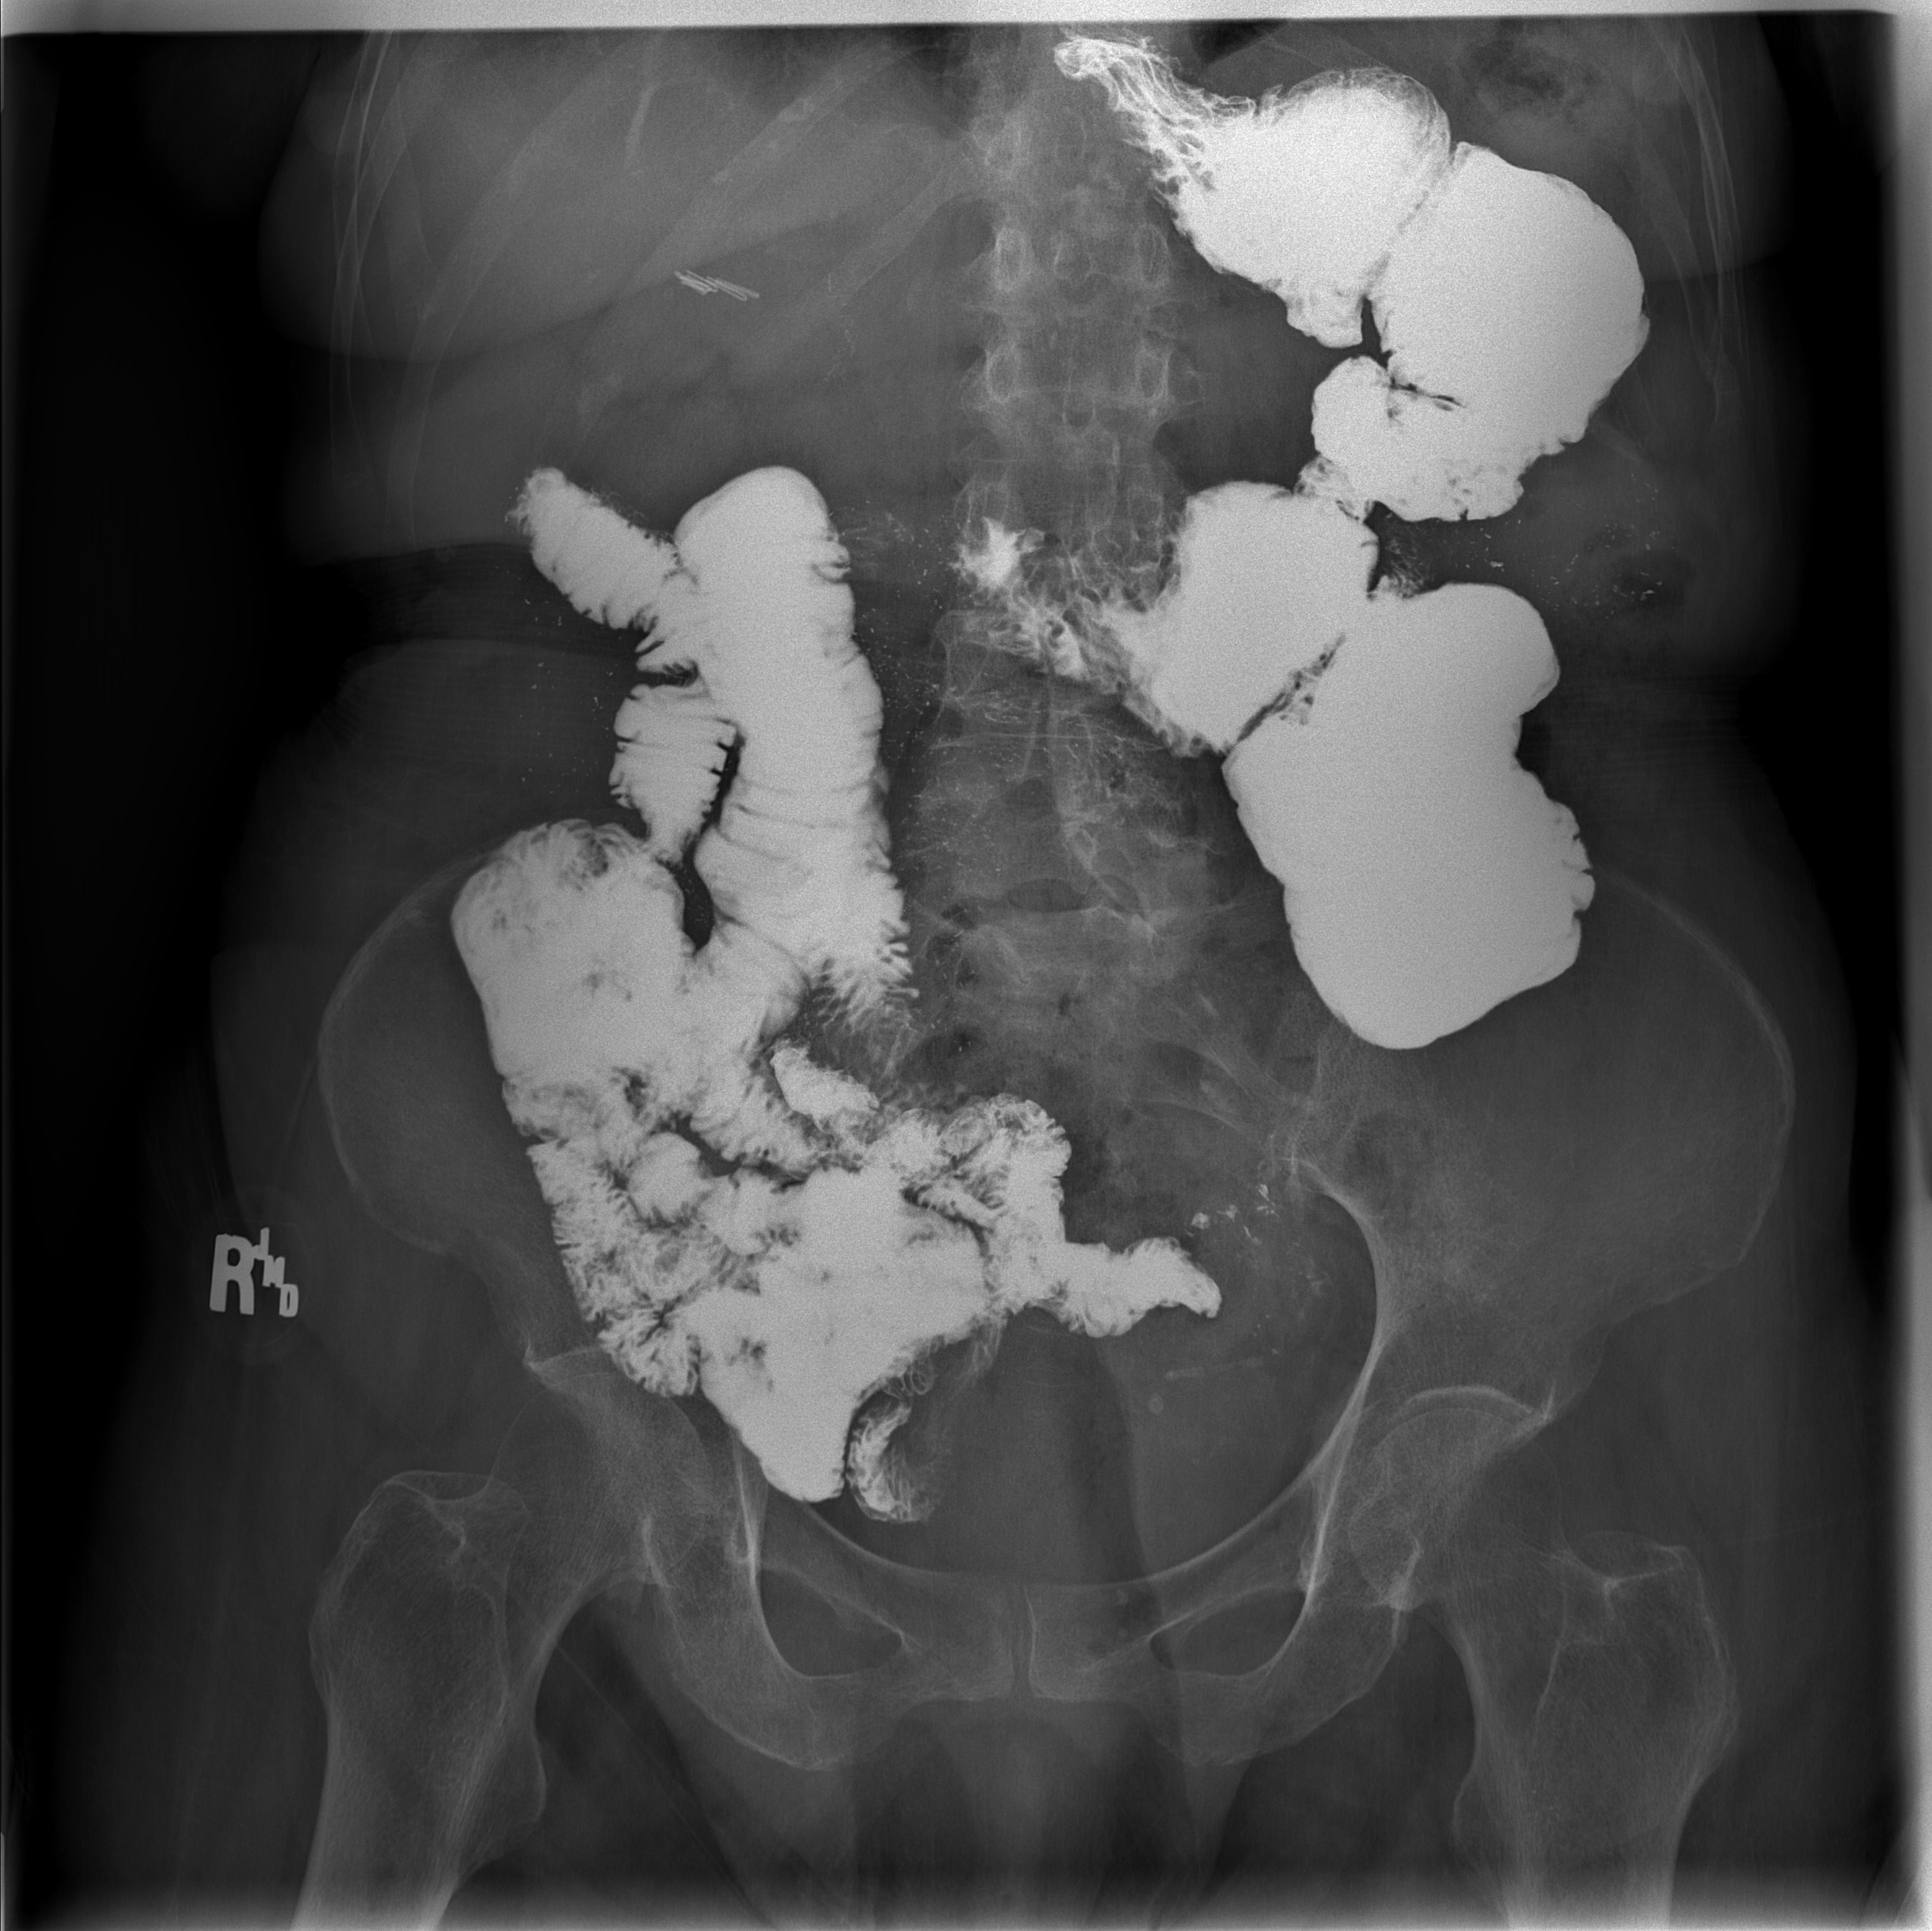

[1 of 1 positions shown; findings below may reference images not displayed]

FINDINGS: Scout radiograph of the abdomen demonstrates a nonobstructive bowel
gas pattern. There is a large stool burden within the left colon.
Cholecystectomy clips within the right upper quadrant of the
abdomen. No acute bony abnormality identified.

As before, there is a diffusely patulous esophagus most notable
distally. Prior Nissen fundoplication. No significant
residual/recurrent hiatal hernia. Intermittent esophageal
dysmotility with non propulsive tertiary contractions. No
gastroesophageal reflux was observed. The patient swallowed a 13 mm
barium tablet, which freely passed into the stomach.

Prior partial gastrectomy and gastrojejunostomy. Small gastric
remnant. There is rapid emptying into a moderately dilated proximal
jejunum, unchanged. There is a prolonged small bowel transit time.
Despite radiographic follow-up for approximately 4 hours, contrast
did not reach the terminal ileum or proximal colon. Apart from the
described moderate dilation of the proximal jejunum, the remainder
of the contrast opacified small bowel is unremarkable.
IMPRESSION: Redemonstrated patulous esophagus. Prior Nissen fundoplication. No
significant residual/recurrent hiatal hernia. No gastroesophageal
reflux is observed.

Intermittent esophageal dysmotility with non-propulsive tertiary
contractions.

Prior partial gastrectomy and gastrojejunostomy. As before, there is
moderate dilation of the proximal jejunum. No evidence of focal
small-bowel obstruction. However, there is a prolonged small bowel
transit time. Despite radiographic follow-up for approximately 4
hours, contrast did not reach the terminal ileum or proximal colon.

Large stool burden within the left colon.

## 2021-02-10 ENCOUNTER — Encounter: Payer: Self-pay | Admitting: Registered Nurse

## 2021-02-11 ENCOUNTER — Encounter (HOSPITAL_BASED_OUTPATIENT_CLINIC_OR_DEPARTMENT_OTHER): Payer: Medicare Other | Admitting: Physical Medicine and Rehabilitation

## 2021-02-11 ENCOUNTER — Other Ambulatory Visit: Payer: Self-pay

## 2021-02-11 DIAGNOSIS — C7A8 Other malignant neuroendocrine tumors: Secondary | ICD-10-CM | POA: Diagnosis not present

## 2021-02-11 DIAGNOSIS — R0781 Pleurodynia: Secondary | ICD-10-CM | POA: Diagnosis not present

## 2021-02-11 DIAGNOSIS — G894 Chronic pain syndrome: Secondary | ICD-10-CM | POA: Diagnosis not present

## 2021-02-11 LAB — DRUG TOX MONITOR 1 W/CONF, ORAL FLD
Amphetamines: NEGATIVE ng/mL (ref <10)
Barbiturates: NEGATIVE ng/mL (ref <10)
Benzodiazepines: NEGATIVE ng/mL (ref <0.50)
Buprenorphine: NEGATIVE ng/mL (ref <0.10)
Cocaine: NEGATIVE ng/mL (ref <5.0)
Codeine: NEGATIVE ng/mL (ref <2.5)
Cotinine: 37.1 ng/mL — ABNORMAL HIGH (ref <5.0)
Dihydrocodeine: NEGATIVE ng/mL (ref <2.5)
Fentanyl: NEGATIVE ng/mL (ref <0.10)
Heroin Metabolite: NEGATIVE ng/mL (ref <1.0)
Hydrocodone: 3.6 ng/mL — ABNORMAL HIGH (ref <2.5)
Hydromorphone: NEGATIVE ng/mL (ref <2.5)
MARIJUANA: NEGATIVE ng/mL (ref <2.5)
MDMA: NEGATIVE ng/mL (ref <10)
Meprobamate: NEGATIVE ng/mL (ref <2.5)
Methadone: NEGATIVE ng/mL (ref <5.0)
Morphine: NEGATIVE ng/mL (ref <2.5)
Nicotine Metabolite: POSITIVE ng/mL — AB (ref <5.0)
Norhydrocodone: NEGATIVE ng/mL (ref <2.5)
Noroxycodone: NEGATIVE ng/mL (ref <2.5)
Opiates: POSITIVE ng/mL — AB (ref <2.5)
Oxycodone: NEGATIVE ng/mL (ref <2.5)
Oxymorphone: NEGATIVE ng/mL (ref <2.5)
Phencyclidine: NEGATIVE ng/mL (ref <10)
Tapentadol: NEGATIVE ng/mL (ref <5.0)
Tramadol: NEGATIVE ng/mL (ref <5.0)
Zolpidem: NEGATIVE ng/mL (ref <5.0)

## 2021-02-11 LAB — DRUG TOX ALC METAB W/CON, ORAL FLD: Alcohol Metabolite: NEGATIVE ng/mL (ref <25)
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
## 2021-02-14 ENCOUNTER — Other Ambulatory Visit: Payer: Self-pay

## 2021-02-14 ENCOUNTER — Inpatient Hospital Stay: Payer: Medicare Other | Attending: Hematology

## 2021-02-14 ENCOUNTER — Ambulatory Visit: Payer: Medicare Other | Admitting: Physical Medicine and Rehabilitation

## 2021-02-14 DIAGNOSIS — D519 Vitamin B12 deficiency anemia, unspecified: Secondary | ICD-10-CM | POA: Diagnosis present

## 2021-02-14 MED ORDER — CYANOCOBALAMIN 1000 MCG/ML IJ SOLN
1000.0000 ug | Freq: Once | INTRAMUSCULAR | Status: AC
  Administered 2021-02-14: 1000 ug via INTRAMUSCULAR
  Filled 2021-02-14: qty 1

## 2021-02-17 ENCOUNTER — Telehealth: Payer: Self-pay | Admitting: *Deleted

## 2021-02-17 NOTE — Telephone Encounter (Signed)
Oral swab drug screen was consistent for prescribed medications.  ?

## 2021-02-19 ENCOUNTER — Ambulatory Visit (INDEPENDENT_AMBULATORY_CARE_PROVIDER_SITE_OTHER): Payer: Medicare Other | Admitting: Podiatry

## 2021-02-19 ENCOUNTER — Other Ambulatory Visit: Payer: Self-pay

## 2021-02-19 ENCOUNTER — Encounter: Payer: Self-pay | Admitting: Podiatry

## 2021-02-19 DIAGNOSIS — M79674 Pain in right toe(s): Secondary | ICD-10-CM

## 2021-02-19 DIAGNOSIS — N179 Acute kidney failure, unspecified: Secondary | ICD-10-CM | POA: Diagnosis not present

## 2021-02-19 DIAGNOSIS — L309 Dermatitis, unspecified: Secondary | ICD-10-CM

## 2021-02-19 DIAGNOSIS — E0842 Diabetes mellitus due to underlying condition with diabetic polyneuropathy: Secondary | ICD-10-CM | POA: Diagnosis not present

## 2021-02-19 DIAGNOSIS — E119 Type 2 diabetes mellitus without complications: Secondary | ICD-10-CM

## 2021-02-19 DIAGNOSIS — M79675 Pain in left toe(s): Secondary | ICD-10-CM

## 2021-02-19 DIAGNOSIS — B351 Tinea unguium: Secondary | ICD-10-CM | POA: Diagnosis not present

## 2021-02-19 NOTE — Progress Notes (Signed)
This patient returns to my office for at risk foot care.  This patient requires this care by a professional since this patient will be at risk due to having  Diabetes.  This patient is unable to cut nails herself since the patient cannot reach her nails.These nails are painful walking and wearing shoes.  This patient presents for at risk foot care today.   General Appearance  Alert, conversant and in no acute stress.  Vascular  Dorsalis pedis and posterior tibial  pulses are palpable  bilaterally.  Capillary return is within normal limits  bilaterally. Temperature is within normal limits  bilaterally.  Neurologic  Senn-Weinstein monofilament wire test absent   bilaterally. Muscle power within normal limits bilaterally.  Nails Thick disfigured discolored nails with subungual debris  from hallux to fifth toes bilaterally. No evidence of bacterial infection or drainage bilaterally.  Orthopedic  No limitations of motion  feet .  No crepitus or effusions noted.  No bony pathology or digital deformities noted.  Skin  normotropic skin with no porokeratosis noted bilaterally.  No signs of infections or ulcers noted.   Peeling noted plantar right heel.  Onychomycosis  Pain in right toes  Pain in left toes  Consent was obtained for treatment procedures.   Mechanical debridement of nails 1-5  bilaterally performed with a nail nipper.  Filed with dremel without incident.  Discussed the peeling with this patient.  The peeling is a chronic problem which has been treated with lotrisone, cortaid and amlactin.  Told her to try lotrisone again.   Return office visit   3 months                   Told patient to return for periodic foot care and evaluation due to potential at risk complications.   Gardiner Barefoot DPM

## 2021-02-20 ENCOUNTER — Encounter: Payer: Self-pay | Admitting: Physical Medicine and Rehabilitation

## 2021-02-20 ENCOUNTER — Encounter (HOSPITAL_BASED_OUTPATIENT_CLINIC_OR_DEPARTMENT_OTHER): Payer: Medicare Other | Admitting: Physical Medicine and Rehabilitation

## 2021-02-20 DIAGNOSIS — R1084 Generalized abdominal pain: Secondary | ICD-10-CM | POA: Diagnosis not present

## 2021-02-20 DIAGNOSIS — R0781 Pleurodynia: Secondary | ICD-10-CM

## 2021-02-20 MED ORDER — GABAPENTIN 600 MG PO TABS: 600.0000 mg | ORAL_TABLET | Freq: Three times a day (TID) | ORAL | 3 refills | Status: DC

## 2021-02-20 MED ORDER — LOPERAMIDE HCL 2 MG PO CAPS: 2.0000 mg | ORAL_CAPSULE | Freq: Four times a day (QID) | ORAL | 3 refills | Status: DC | PRN

## 2021-02-20 MED ORDER — PROMETHAZINE HCL 12.5 MG PO TABS: 12.5000 mg | ORAL_TABLET | ORAL | 3 refills | Status: DC | PRN
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
## 2021-02-25 ENCOUNTER — Other Ambulatory Visit: Payer: Self-pay

## 2021-02-25 ENCOUNTER — Encounter (HOSPITAL_BASED_OUTPATIENT_CLINIC_OR_DEPARTMENT_OTHER): Payer: Medicare Other | Admitting: Physical Medicine and Rehabilitation

## 2021-02-25 ENCOUNTER — Other Ambulatory Visit: Payer: Self-pay | Admitting: Physician Assistant

## 2021-02-25 DIAGNOSIS — R42 Dizziness and giddiness: Secondary | ICD-10-CM | POA: Diagnosis not present
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
## 2021-02-27 ENCOUNTER — Inpatient Hospital Stay: Payer: Medicare Other

## 2021-02-27 ENCOUNTER — Inpatient Hospital Stay: Payer: Medicare Other | Admitting: Nurse Practitioner

## 2021-02-28 ENCOUNTER — Telehealth: Payer: Self-pay | Admitting: Physician Assistant

## 2021-02-28 NOTE — Telephone Encounter (Signed)
Sch per 10/27 los, pt aware 

## 2021-03-04 ENCOUNTER — Telehealth: Payer: Medicare Other | Admitting: Physical Medicine and Rehabilitation

## 2021-03-04 ENCOUNTER — Other Ambulatory Visit: Payer: Self-pay | Admitting: Physical Medicine and Rehabilitation

## 2021-03-06 ENCOUNTER — Other Ambulatory Visit: Payer: Self-pay

## 2021-03-06 ENCOUNTER — Encounter
Payer: Medicare Other | Attending: Physical Medicine and Rehabilitation | Admitting: Physical Medicine and Rehabilitation

## 2021-03-06 ENCOUNTER — Ambulatory Visit (INDEPENDENT_AMBULATORY_CARE_PROVIDER_SITE_OTHER): Payer: Medicare Other

## 2021-03-06 ENCOUNTER — Encounter: Payer: Self-pay | Admitting: Emergency Medicine

## 2021-03-06 ENCOUNTER — Ambulatory Visit
Admission: EM | Admit: 2021-03-06 | Discharge: 2021-03-06 | Disposition: A | Discharge status: Home / Self Care | Payer: Medicare Other | Attending: Internal Medicine | Admitting: Internal Medicine

## 2021-03-06 DIAGNOSIS — S52515A Nondisplaced fracture of left radial styloid process, initial encounter for closed fracture: Secondary | ICD-10-CM

## 2021-03-06 DIAGNOSIS — S52502A Unspecified fracture of the lower end of left radius, initial encounter for closed fracture: Secondary | ICD-10-CM | POA: Diagnosis not present

## 2021-03-06 DIAGNOSIS — C7A8 Other malignant neuroendocrine tumors: Secondary | ICD-10-CM | POA: Insufficient documentation

## 2021-03-06 MED ORDER — HYDROCODONE-ACETAMINOPHEN 7.5-325 MG PO TABS: 1.0000 | ORAL_TABLET | Freq: Two times a day (BID) | ORAL | 0 refills | Status: DC | PRN

## 2021-03-06 NOTE — ED Triage Notes (Signed)
Patient states that her cat jumped on her back last night and she fell backwards.  Patient is c/o left hand, wrist, arm and shoulder pain.  There is swelling in the left wrist.  Denies any OTC meds for pain.
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
## 2021-03-06 NOTE — Progress Notes (Signed)
Norco refill requested and sent.
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
## 2021-03-07 ENCOUNTER — Encounter: Payer: Self-pay | Admitting: Physician Assistant

## 2021-03-07 ENCOUNTER — Other Ambulatory Visit: Payer: Self-pay

## 2021-03-07 ENCOUNTER — Inpatient Hospital Stay: Payer: Medicare Other

## 2021-03-07 ENCOUNTER — Other Ambulatory Visit: Payer: Self-pay | Admitting: Physical Medicine and Rehabilitation

## 2021-03-07 ENCOUNTER — Inpatient Hospital Stay (HOSPITAL_BASED_OUTPATIENT_CLINIC_OR_DEPARTMENT_OTHER): Payer: Medicare Other | Admitting: Physician Assistant

## 2021-03-07 ENCOUNTER — Inpatient Hospital Stay: Payer: Medicare Other | Attending: Hematology

## 2021-03-07 VITALS — BP 144/61 | HR 60 | Temp 97.3°F | Resp 18 | Ht 63.0 in | Wt 103.2 lb

## 2021-03-07 DIAGNOSIS — Z79899 Other long term (current) drug therapy: Secondary | ICD-10-CM | POA: Diagnosis not present

## 2021-03-07 DIAGNOSIS — D649 Anemia, unspecified: Secondary | ICD-10-CM

## 2021-03-07 DIAGNOSIS — R112 Nausea with vomiting, unspecified: Secondary | ICD-10-CM | POA: Diagnosis not present

## 2021-03-07 DIAGNOSIS — R109 Unspecified abdominal pain: Secondary | ICD-10-CM | POA: Insufficient documentation

## 2021-03-07 DIAGNOSIS — R5383 Other fatigue: Secondary | ICD-10-CM | POA: Insufficient documentation

## 2021-03-07 DIAGNOSIS — N281 Cyst of kidney, acquired: Secondary | ICD-10-CM | POA: Diagnosis not present

## 2021-03-07 DIAGNOSIS — E119 Type 2 diabetes mellitus without complications: Secondary | ICD-10-CM | POA: Insufficient documentation

## 2021-03-07 DIAGNOSIS — D519 Vitamin B12 deficiency anemia, unspecified: Secondary | ICD-10-CM | POA: Diagnosis present

## 2021-03-07 DIAGNOSIS — K219 Gastro-esophageal reflux disease without esophagitis: Secondary | ICD-10-CM | POA: Diagnosis not present

## 2021-03-07 DIAGNOSIS — S5292XA Unspecified fracture of left forearm, initial encounter for closed fracture: Secondary | ICD-10-CM | POA: Insufficient documentation

## 2021-03-07 DIAGNOSIS — R296 Repeated falls: Secondary | ICD-10-CM | POA: Diagnosis not present

## 2021-03-07 DIAGNOSIS — I700 Atherosclerosis of aorta: Secondary | ICD-10-CM | POA: Diagnosis not present

## 2021-03-07 DIAGNOSIS — D508 Other iron deficiency anemias: Secondary | ICD-10-CM

## 2021-03-07 DIAGNOSIS — G8929 Other chronic pain: Secondary | ICD-10-CM | POA: Insufficient documentation

## 2021-03-07 DIAGNOSIS — C7A8 Other malignant neuroendocrine tumors: Secondary | ICD-10-CM | POA: Diagnosis not present

## 2021-03-07 LAB — CBC WITH DIFFERENTIAL/PLATELET
Abs Immature Granulocytes: 0.05 10*3/uL (ref 0.00–0.07)
Basophils Absolute: 0 10*3/uL (ref 0.0–0.1)
Basophils Relative: 0 %
Eosinophils Absolute: 0 10*3/uL (ref 0.0–0.5)
Eosinophils Relative: 0 %
HCT: 31.4 % — ABNORMAL LOW (ref 36.0–46.0)
Hemoglobin: 10.3 g/dL — ABNORMAL LOW (ref 12.0–15.0)
Immature Granulocytes: 0 %
Lymphocytes Relative: 14 %
Lymphs Abs: 2 10*3/uL (ref 0.7–4.0)
MCH: 25.1 pg — ABNORMAL LOW (ref 26.0–34.0)
MCHC: 32.8 g/dL (ref 30.0–36.0)
MCV: 76.6 fL — ABNORMAL LOW (ref 80.0–100.0)
Monocytes Absolute: 0.8 10*3/uL (ref 0.1–1.0)
Monocytes Relative: 6 %
Neutro Abs: 10.9 10*3/uL — ABNORMAL HIGH (ref 1.7–7.7)
Neutrophils Relative %: 80 %
Platelets: 149 10*3/uL — ABNORMAL LOW (ref 150–400)
RBC: 4.1 MIL/uL (ref 3.87–5.11)
RDW: 15.4 % (ref 11.5–15.5)
Smear Review: NORMAL
WBC: 13.7 10*3/uL — ABNORMAL HIGH (ref 4.0–10.5)
nRBC: 0 % (ref 0.0–0.2)

## 2021-03-07 LAB — COMPREHENSIVE METABOLIC PANEL
ALT: 20 U/L (ref 0–44)
AST: 23 U/L (ref 15–41)
Albumin: 3.2 g/dL — ABNORMAL LOW (ref 3.5–5.0)
Alkaline Phosphatase: 86 U/L (ref 38–126)
Anion gap: 6 (ref 5–15)
BUN: 19 mg/dL (ref 8–23)
CO2: 25 mmol/L (ref 22–32)
Calcium: 8.3 mg/dL — ABNORMAL LOW (ref 8.9–10.3)
Chloride: 107 mmol/L (ref 98–111)
Creatinine, Ser: 1.18 mg/dL — ABNORMAL HIGH (ref 0.44–1.00)
GFR, Estimated: 47 mL/min — ABNORMAL LOW (ref >60)
Glucose, Bld: 85 mg/dL (ref 70–99)
Potassium: 4.2 mmol/L (ref 3.5–5.1)
Sodium: 138 mmol/L (ref 135–145)
Total Bilirubin: 0.4 mg/dL (ref 0.3–1.2)
Total Protein: 6.1 g/dL — ABNORMAL LOW (ref 6.5–8.1)

## 2021-03-07 LAB — FERRITIN: Ferritin: 205 ng/mL (ref 11–307)

## 2021-03-07 MED ORDER — CYANOCOBALAMIN 1000 MCG/ML IJ SOLN
1000.0000 ug | Freq: Once | INTRAMUSCULAR | Status: AC
  Administered 2021-03-07: 1000 ug via INTRAMUSCULAR
  Filled 2021-03-07: qty 1

## 2021-03-07 MED ORDER — OXYCODONE-ACETAMINOPHEN 7.5-325 MG PO TABS: 1.0000 | ORAL_TABLET | ORAL | 0 refills | Status: DC | PRN

## 2021-03-10 ENCOUNTER — Emergency Department (HOSPITAL_COMMUNITY)
Admission: EM | Admit: 2021-03-10 | Discharge: 2021-03-10 | Disposition: A | Discharge status: Home / Self Care | Payer: Medicare Other | Attending: Emergency Medicine | Admitting: Emergency Medicine

## 2021-03-10 ENCOUNTER — Emergency Department (HOSPITAL_COMMUNITY): Payer: Medicare Other

## 2021-03-10 DIAGNOSIS — F17210 Nicotine dependence, cigarettes, uncomplicated: Secondary | ICD-10-CM | POA: Diagnosis not present

## 2021-03-10 DIAGNOSIS — E860 Dehydration: Secondary | ICD-10-CM | POA: Diagnosis not present

## 2021-03-10 DIAGNOSIS — E119 Type 2 diabetes mellitus without complications: Secondary | ICD-10-CM | POA: Diagnosis not present

## 2021-03-10 DIAGNOSIS — Z79899 Other long term (current) drug therapy: Secondary | ICD-10-CM | POA: Diagnosis not present

## 2021-03-10 DIAGNOSIS — Z85038 Personal history of other malignant neoplasm of large intestine: Secondary | ICD-10-CM | POA: Insufficient documentation

## 2021-03-10 DIAGNOSIS — R4182 Altered mental status, unspecified: Secondary | ICD-10-CM | POA: Diagnosis present

## 2021-03-10 DIAGNOSIS — I10 Essential (primary) hypertension: Secondary | ICD-10-CM | POA: Insufficient documentation

## 2021-03-10 LAB — COMPREHENSIVE METABOLIC PANEL
ALT: 13 U/L (ref 0–44)
AST: 22 U/L (ref 15–41)
Albumin: 3.6 g/dL (ref 3.5–5.0)
Alkaline Phosphatase: 90 U/L (ref 38–126)
Anion gap: 8 (ref 5–15)
BUN: 15 mg/dL (ref 8–23)
CO2: 24 mmol/L (ref 22–32)
Calcium: 8.6 mg/dL — ABNORMAL LOW (ref 8.9–10.3)
Chloride: 101 mmol/L (ref 98–111)
Creatinine, Ser: 1.26 mg/dL — ABNORMAL HIGH (ref 0.44–1.00)
GFR, Estimated: 43 mL/min — ABNORMAL LOW (ref >60)
Glucose, Bld: 123 mg/dL — ABNORMAL HIGH (ref 70–99)
Potassium: 4.1 mmol/L (ref 3.5–5.1)
Sodium: 133 mmol/L — ABNORMAL LOW (ref 135–145)
Total Bilirubin: 0.6 mg/dL (ref 0.3–1.2)
Total Protein: 7.2 g/dL (ref 6.5–8.1)

## 2021-03-10 LAB — CBC WITH DIFFERENTIAL/PLATELET
Abs Immature Granulocytes: 0.04 10*3/uL (ref 0.00–0.07)
Basophils Absolute: 0 10*3/uL (ref 0.0–0.1)
Basophils Relative: 0 %
Eosinophils Absolute: 0 10*3/uL (ref 0.0–0.5)
Eosinophils Relative: 0 %
HCT: 36.3 % (ref 36.0–46.0)
Hemoglobin: 11.7 g/dL — ABNORMAL LOW (ref 12.0–15.0)
Immature Granulocytes: 0 %
Lymphocytes Relative: 12 %
Lymphs Abs: 1.2 10*3/uL (ref 0.7–4.0)
MCH: 24.7 pg — ABNORMAL LOW (ref 26.0–34.0)
MCHC: 32.2 g/dL (ref 30.0–36.0)
MCV: 76.6 fL — ABNORMAL LOW (ref 80.0–100.0)
Monocytes Absolute: 0.8 10*3/uL (ref 0.1–1.0)
Monocytes Relative: 7 %
Neutro Abs: 8.3 10*3/uL — ABNORMAL HIGH (ref 1.7–7.7)
Neutrophils Relative %: 81 %
Platelets: 175 10*3/uL (ref 150–400)
RBC: 4.74 MIL/uL (ref 3.87–5.11)
RDW: 14.4 % (ref 11.5–15.5)
WBC: 10.4 10*3/uL (ref 4.0–10.5)
nRBC: 0 % (ref 0.0–0.2)

## 2021-03-10 LAB — URINALYSIS, ROUTINE W REFLEX MICROSCOPIC
Bacteria, UA: NONE SEEN
Bilirubin Urine: NEGATIVE
Glucose, UA: NEGATIVE mg/dL
Ketones, ur: NEGATIVE mg/dL
Leukocytes,Ua: NEGATIVE
Nitrite: NEGATIVE
Protein, ur: NEGATIVE mg/dL
Specific Gravity, Urine: 1.008 (ref 1.005–1.030)
pH: 6 (ref 5.0–8.0)

## 2021-03-10 IMAGING — RF DG SWALLOWING FUNCTION
12 of 16 series · 12 of 24 positions shown · non-contrast
Comparison: None.

CLINICAL DATA: Dysphagia and esophageal dysmotility

EXAM:
MODIFIED BARIUM SWALLOW
TECHNIQUE: Different consistencies of barium were administered orally to the
patient by the Speech Pathologist. Imaging of the pharynx was
performed in the lateral projection. The radiologist was present in
the fluoroscopy room for this study, providing personal supervision.
FLUOROSCOPY TIME:  Fluoroscopy Time:  2 minutes and 2 seconds
Radiation Exposure Index (if provided by the fluoroscopic device):
12.72
Number of Acquired Spot Images: 0

[Series 1: run · 1 of 127 frames shown (1 of 12)]
[frame 108/127]
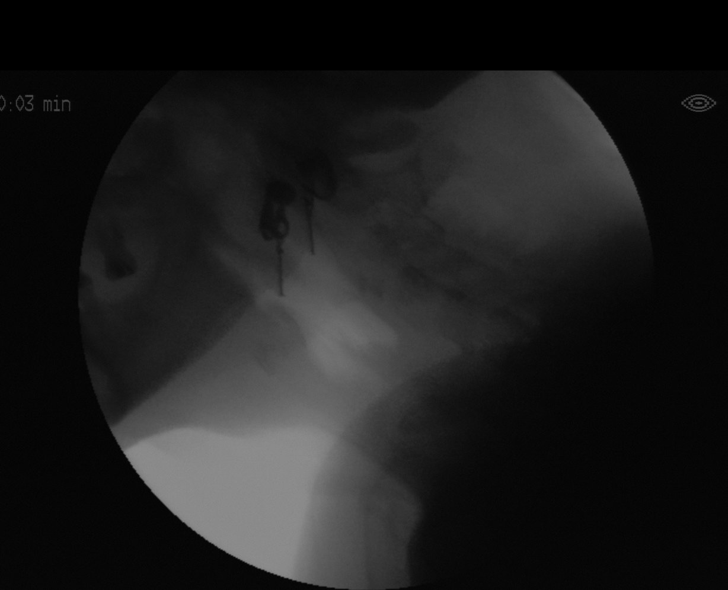

[Series 3: run · 1 of 121 frames shown (2 of 12)]
[frame 17/121]
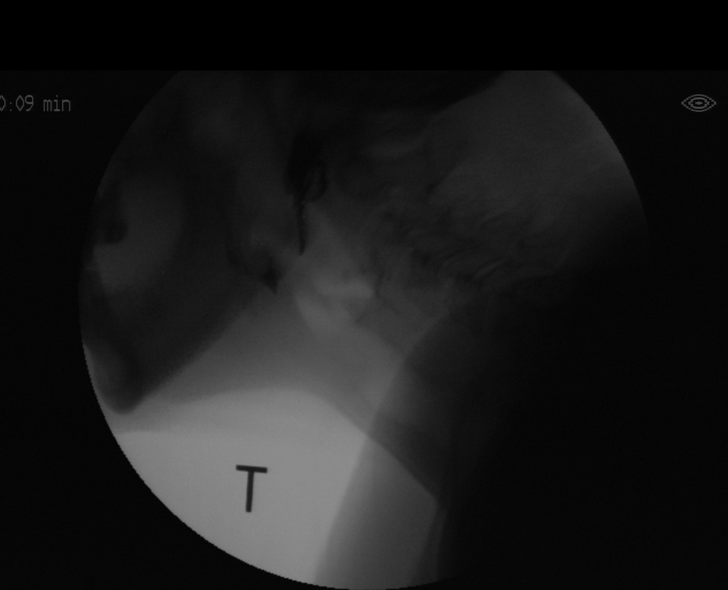

[Series 4: run · 1 of 280 frames shown (3 of 12)]
[frame 141/280]
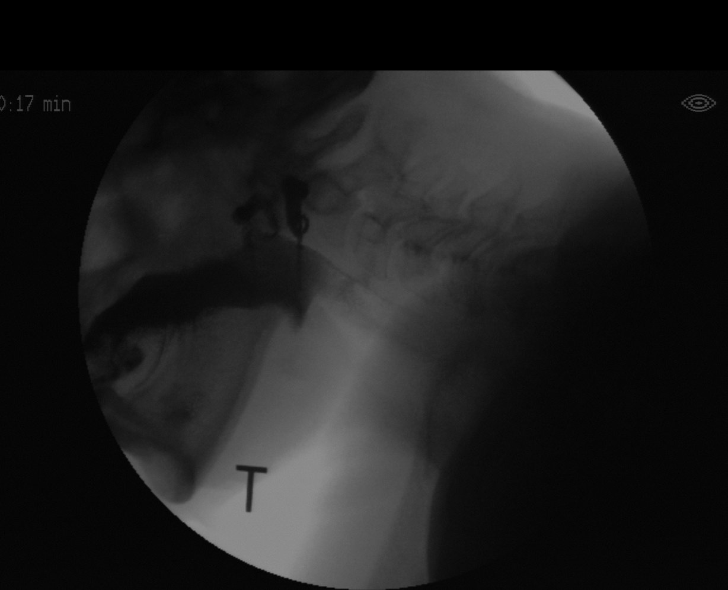

[Series 5: run · 1 of 178 frames shown (4 of 12)]
[frame 152/178]
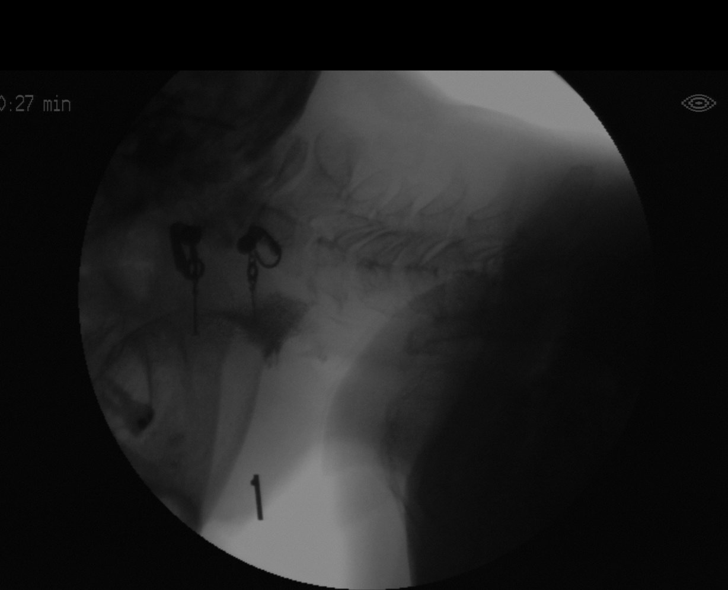

[Series 7: run · 1 of 286 frames shown (5 of 12)]
[frame 58/286]
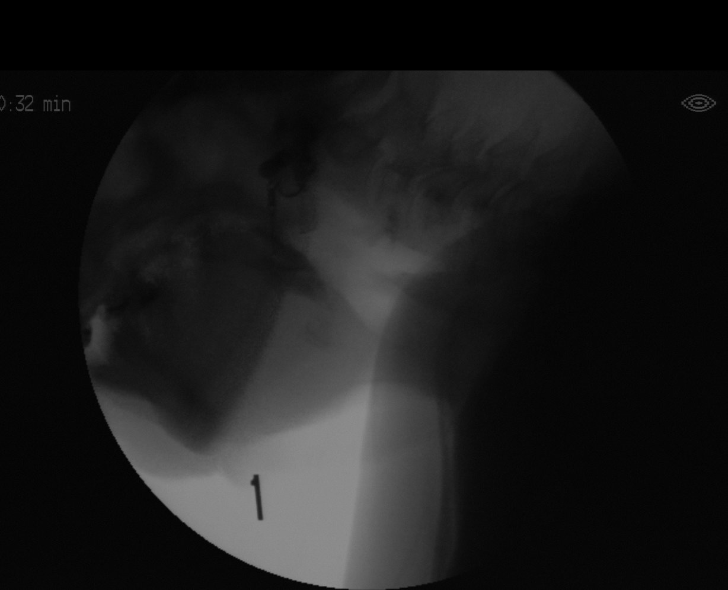

[Series 8: run · 1 of 398 frames shown (6 of 12)]
[frame 287/398]
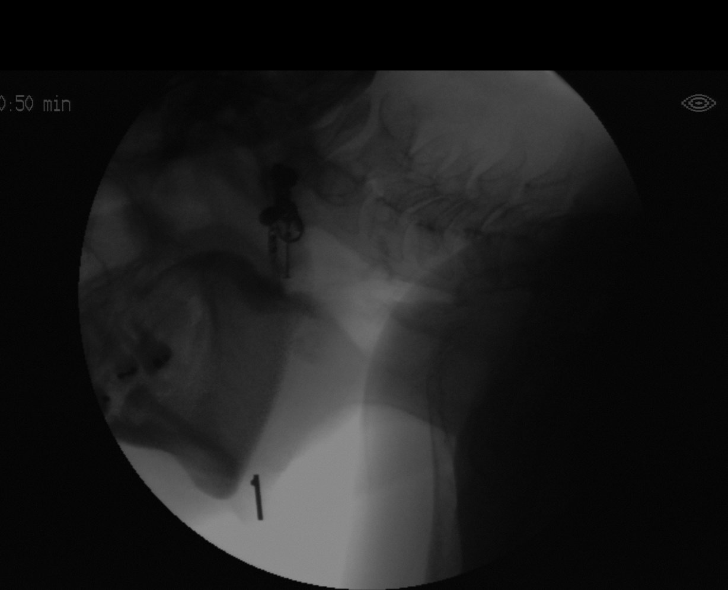

[Series 10: run · 1 of 389 frames shown (7 of 12)]
[frame 59/389]
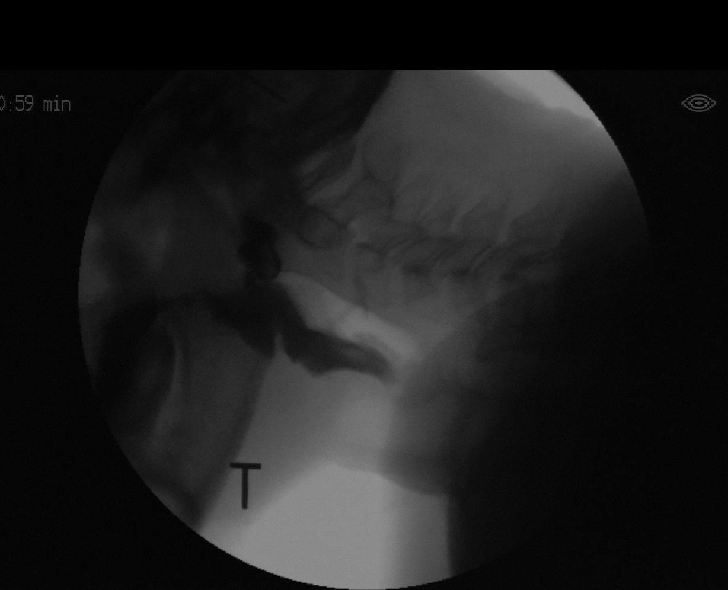

[Series 11: run · 1 of 201 frames shown (8 of 12)]
[frame 101/201]
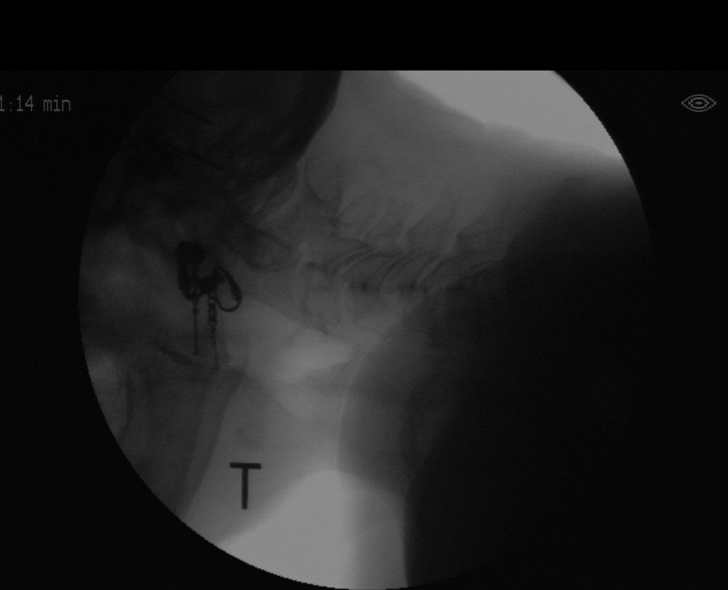

[Series 12: run · 1 of 389 frames shown (9 of 12)]
[frame 331/389]
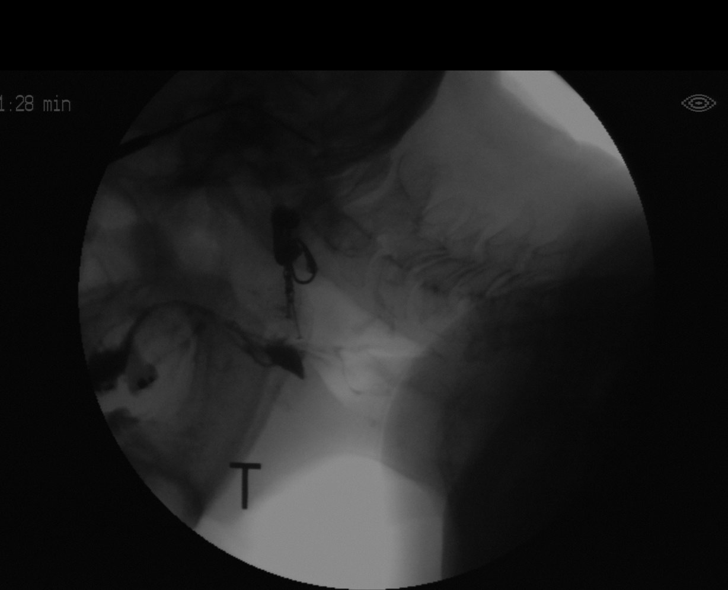

[Series 14: run · 1 of 272 frames shown (10 of 12)]
[frame 41/272]
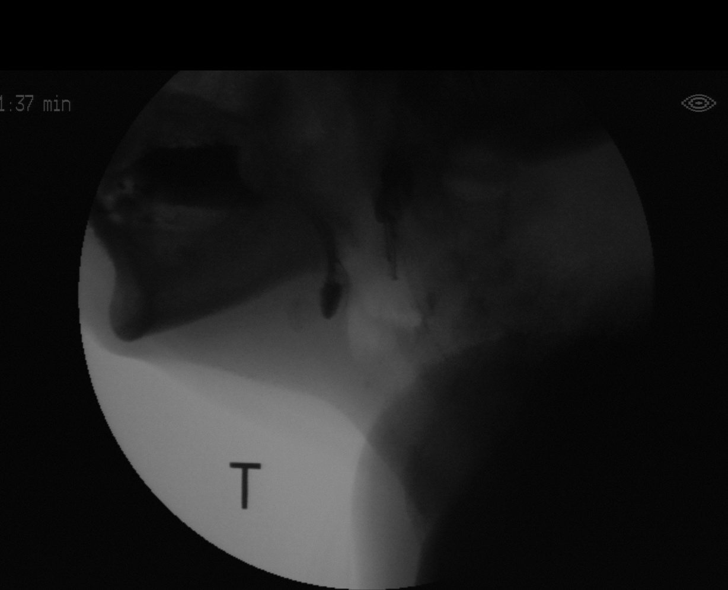

[Series 15: run · 1 of 422 frames shown (11 of 12)]
[frame 268/422]
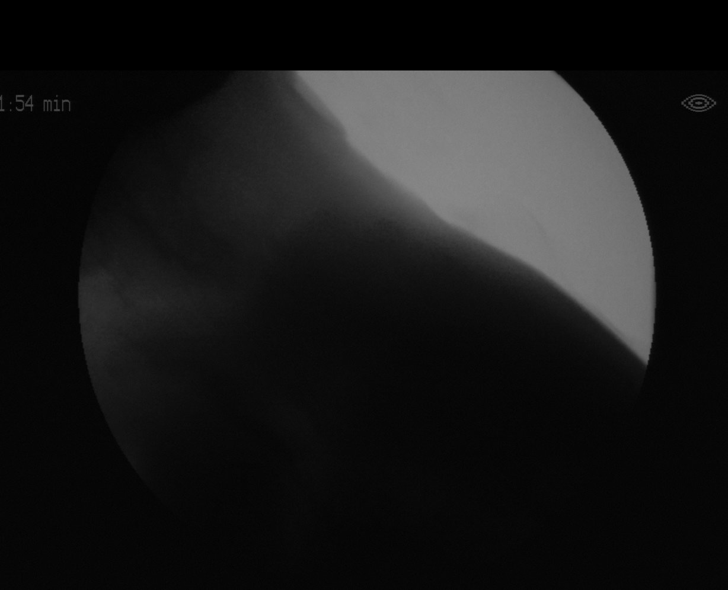

[Series 16: run · 1 of 91 frames shown (12 of 12)]
[frame 78/91]
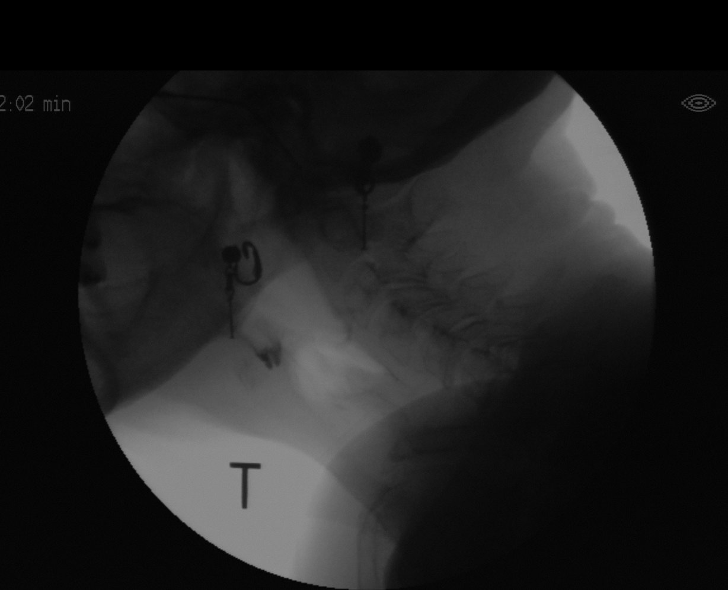

[12 of 24 positions shown; findings below may reference images not displayed]

FINDINGS: Different consistencies of barium were administered orally to the
patient by the Speech Pathologist with fluoroscopic imaging of the
pharynx, as well as limited imaging of the esophagus, from a lateral
projection. The radiologist was present in the fluoroscopy room for
this study, providing personal supervision. The Speech Pathologist
did observe 1 episode of trace aspiration. Please refer to the
Speech Pathologist's report for full details.
IMPRESSION: Please refer to the Speech Pathologists report for complete details
and recommendations.

## 2021-03-10 MED ORDER — SODIUM CHLORIDE 0.9 % IV BOLUS
1000.0000 mL | Freq: Once | INTRAVENOUS | Status: AC
  Administered 2021-03-10: 1000 mL via INTRAVENOUS

## 2021-03-10 NOTE — ED Notes (Signed)
Pt placed on purewick 

## 2021-03-10 NOTE — ED Notes (Signed)
Pt NAD in bed with family at bedside. A/ox4, slower to respond and soft spoken. Pt states she has not been feeling herself for a couple of weeks. Pt is very vague with symptoms. States she sometimes has trouble thinking of words. +intermittent dizzy spells. Pt denies headache, blurred vision, syncope, CP, SOB, cough, ABD pain, n/v/d, GU symptoms or fever.
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
## 2021-03-10 NOTE — ED Triage Notes (Signed)
Family brings in pt for altered mental status over the past week. Family states pt has not been eating very much and is having trouble finding her words when trying to answer questions. Pt reports abdominal pain and not feeling well.

## 2021-03-10 NOTE — ED Provider Notes (Signed)
Emergency Medicine Provider Triage Evaluation Note  Emily [name] , a 80 y.o. female  was evaluated in triage.  Pt complains of altered mental status. Level 5 caveat for altered mental status.  Daughter is at bedside he states that yesterday she called her mother and the patient stated that she had been staying in bed all day and feeling weak.  States that she called her today on her lunch break and the patient did not sound like herself.  Daughter states that she was confused about what doctor appointment she had today and "could not complete her thoughts."  She also states that she has not been eating per usual over the past week and had a fall last Thursday.  The patient does state that "I have not been peeing like I supposed to."  Denies fevers or waking up with sweats or having chills.  Denies chest pain or shortness of breath.  Denies coughing, congestion, sick contacts.  Review of Systems  Positive: Altered mental status  Negative: Fever  Physical Exam  There were no vitals taken for this visit. Gen:   Awake, no distress, alert and oriented to person, place, time.  Difficulty describing why she is here in the emergency department. Resp:  Normal effort, lungs clear to auscultation bilaterally Neuro:   Alert and oriented to person, place, time.  Pupils 2 mm bilaterally.  PERRLA.  Equal strength in bilateral upper and lower extremities.  Sensation intact.  No pronator drift. MSK:   Moves extremities without difficulty Other:  Abdomen is flat, soft, nontender.  Positive bowel sounds.  Extremities without swelling.  She does feel warm to touch although afebrile with recent vitals.  Medical Decision Making  Medically screening exam initiated at 2:54 PM.  Appropriate orders placed.  Ciearra Piggee was informed that the remainder of the evaluation will be completed by another provider, this initial triage assessment does not replace that evaluation, and the importance of remaining in the ED until  their evaluation is complete.     Mickie Hillier, PA-C 03/10/21 1508    Blanchie Dessert, MD 03/10/21 [phone]

## 2021-03-10 NOTE — ED Notes (Signed)
IV attempt x 2 unsuccessful, p very difficult stick, IV team consult placed

## 2021-03-10 NOTE — Discharge Instructions (Signed)
Return for any problem.   Drink plenty of fluids.  Avoid use of narcotics.

## 2021-03-10 NOTE — ED Notes (Signed)
Pt NAD, a/ox4. Pt and family verbalize understanding of all DC and f/u instructions. All questions answered. Pt assisted into w/c and taken to lobby by tech.

## 2021-03-12 ENCOUNTER — Telehealth: Payer: Self-pay | Admitting: Gastroenterology

## 2021-03-12 DIAGNOSIS — M25532 Pain in left wrist: Secondary | ICD-10-CM | POA: Insufficient documentation

## 2021-03-12 NOTE — Telephone Encounter (Signed)
Inbound call from from patient. States she is losing weight uncontrollable and feeling sick. States she can eat only a little before feeling full.

## 2021-03-13 NOTE — Telephone Encounter (Signed)
Agree, narcotics can worsen her symptoms . Ok to continue Pedialyte and small frequent meals. I am happy to discuss with PMD if has any questions. Thanks

## 2021-03-13 NOTE — Telephone Encounter (Signed)
I spoke with Ms Emily [name] and failed to send you a message. She has had an eventful month. She fell and broke her wrist. Her cat caused the fall. Seen in the ER. She was given narcotic pain medication which she used sparingly. She had another visit to the ER and was found to be dehydrated. Her daughter was concerned her medications were causing her worsened decrease in her appetite and causing confusion.  Ms Emily [name] reports her daughter came over and "threw out a bunch of medicines." Ms Emily [name] tells me she has continued to have less and less appetite. She is still eating once a day. She is not interested in water, and drinks Pedialyte instead in an attempt to maintain hydration. Still battles with constipation but she is having bowel movements.  She said her PCP wants to talk with you about [name]'s medications and decreased appetite. I can pursue this if you need me to Does patient need a follow up visit scheduled?

## 2021-03-14 ENCOUNTER — Other Ambulatory Visit: Payer: Self-pay

## 2021-03-14 ENCOUNTER — Encounter (HOSPITAL_BASED_OUTPATIENT_CLINIC_OR_DEPARTMENT_OTHER): Payer: Medicare Other | Admitting: Physical Medicine and Rehabilitation

## 2021-03-14 DIAGNOSIS — C7A8 Other malignant neuroendocrine tumors: Secondary | ICD-10-CM

## 2021-03-14 MED ORDER — METAXALONE 800 MG PO TABS: 400.0000 mg | ORAL_TABLET | Freq: Three times a day (TID) | ORAL | 3 refills | Status: DC | PRN

## 2021-03-14 NOTE — Progress Notes (Signed)
Patient cannot come in today due to wrist fracture- I have sent for her some skelaxin to help with her pain

## 2021-03-14 NOTE — Telephone Encounter (Signed)
Spoke with the patient. Advised of Dr Woodward Ku response. Spoke with PCP office. No mention of needing to discuss patient with GI by the PCP.

## 2021-03-17 ENCOUNTER — Telehealth: Payer: Self-pay

## 2021-03-17 NOTE — Telephone Encounter (Signed)
PA for Metaxalone 800 MG (Skelaxin) started in Cover Mymeds on 03/17/2021.

## 2021-03-19 NOTE — Telephone Encounter (Signed)
Decision Notes: Metaxalone is denied because it is not on your plan's Drug List (formulary). Medication authorization requires the following: (1) You need to try one (1) of these covered drugs: (a) EC-Naproxen or naproxen tablet. (b) Etodolac capsule. (c) Flurbiprofen. (d) IBU or ibuprofen.  (2) OR your doctor needs to give [place] specific medical reasons why one (1) of the covered drug(s) is not appropriate for you. Reviewed by: Jeris Penta **Please note: This product is on the high-risk medication list and is not recommended in patients 65 years and older due to a higher risk potential for side effects. According to the Centers for Medicare & Medicaid [place] for [employer] (NCQA) this medication should be avoided in the elderly. "Drugs to be Avoided in the Elderly" is measure 238 of the Centers for Medicare & Medicaid Services Physician Quality Reporting System. There may be safer treatment alternatives

## 2021-03-21 ENCOUNTER — Other Ambulatory Visit: Payer: Self-pay

## 2021-03-21 ENCOUNTER — Inpatient Hospital Stay: Payer: Medicare Other

## 2021-03-21 DIAGNOSIS — D519 Vitamin B12 deficiency anemia, unspecified: Secondary | ICD-10-CM

## 2021-03-21 DIAGNOSIS — C7A8 Other malignant neuroendocrine tumors: Secondary | ICD-10-CM | POA: Diagnosis not present

## 2021-03-21 MED ORDER — CYANOCOBALAMIN 1000 MCG/ML IJ SOLN
1000.0000 ug | Freq: Once | INTRAMUSCULAR | Status: AC
  Administered 2021-03-21: 1000 ug via INTRAMUSCULAR
  Filled 2021-03-21: qty 1

## 2021-03-21 NOTE — Telephone Encounter (Signed)
Dr.Raulkar's reply faxed to [place] fax [phone].

## 2021-03-29 IMAGING — CT CT ANGIO NECK
2 of 11 series · 4 of 33 positions shown · IV contrast (iopamidol)
Comparison: MRI of the brain May 09, 2019.

CLINICAL DATA: Syncope, recurrent.

EXAM:
CT ANGIOGRAPHY HEAD AND NECK
TECHNIQUE: Multidetector CT imaging of the head and neck was performed using
the standard protocol during bolus administration of intravenous
contrast. Multiplanar CT image reconstructions and MIPs were
obtained to evaluate the vascular anatomy. Carotid stenosis
measurements (when applicable) are obtained utilizing NASCET
criteria, using the distal internal carotid diameter as the
denominator.
CONTRAST:  75mL LHM6GA-PE7 IOPAMIDOL (LHM6GA-PE7) INJECTION 76%

[Series 11: brain 3.00 hr40 s3 sag without ibhc · sagittal · non-contrast · 0.35mm/px · 1 of 65 slices shown]
[im 33/65  soft-tissue]
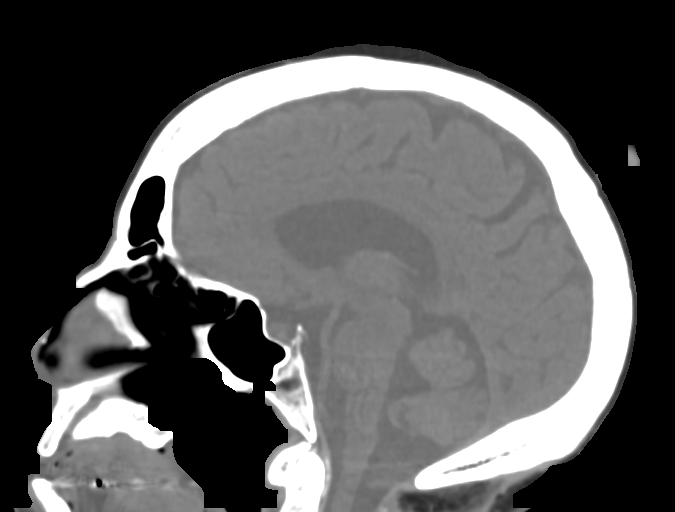

[Series 17: cta head & neck 1.00 hv48 cor thin mips · axial · 0.71mm/px · z∈[-601,-427]mm · 3 of 308 slices shown]
[im 1/308  soft-tissue]
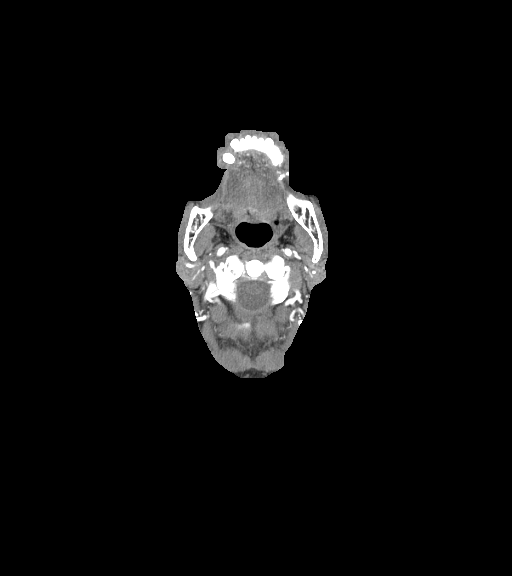
[im 154/308  bone]
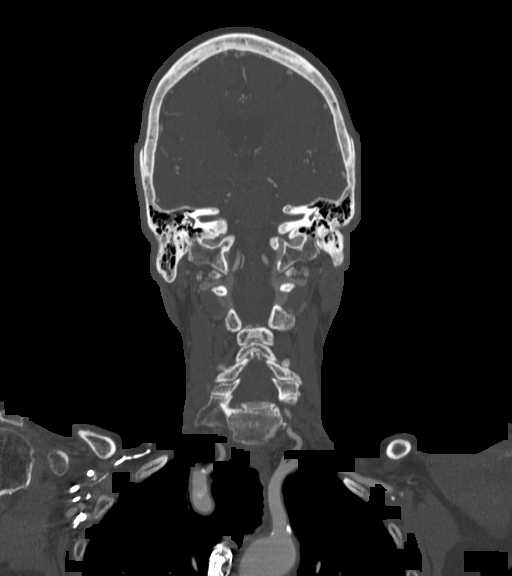
[im 308/308  soft-tissue]
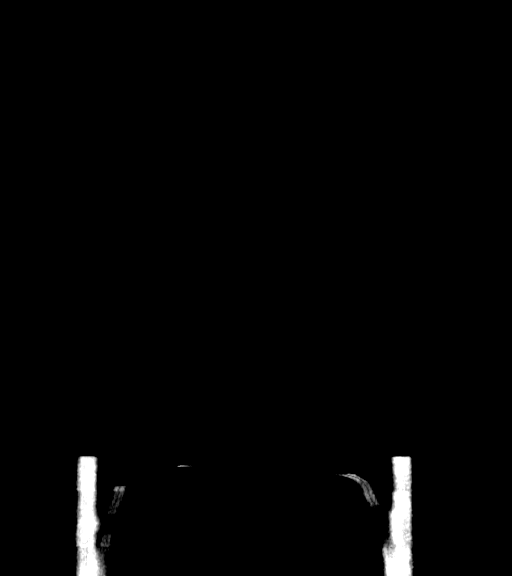

[4 of 33 positions shown; findings below may reference images not displayed]

FINDINGS: CT HEAD FINDINGS

Brain: No evidence of acute infarction, hemorrhage, hydrocephalus,
extra-axial collection or mass lesion/mass effect. Mild confluent
hypodensity of the periventricular white matter, nonspecific, likely
related to chronic small vessel ischemia.

Vascular: Calcified plaques in the bilateral carotid siphons.

Skull: Normal. Negative for fracture or focal lesion.

Sinuses: Mucous retention cyst in the right maxillary sinus.

Orbits: No acute finding.

Review of the MIP images confirms the above findings

CTA NECK FINDINGS

Aortic arch: Standard branching. Imaged portion shows no evidence of
aneurysm or dissection. Atherosclerotic changes are noted in the
aortic arch and origin of the major neck arteries without
hemodynamically significant stenosis.

Right carotid system: Atherosclerotic changes are noted in the right
carotid bifurcation with mixed density plaque, predominantly
calcified, without hemodynamically significant stenosis.

Left carotid system: Atherosclerotic changes in the left carotid
bifurcation with calcified plaques without hemodynamically
significant stenosis.

Vertebral arteries: Codominant. Small calcified plaque at the origin
of right vertebral artery without stenosis. The origin of the left
vertebral artery is maintained. No evidence of dissection, stenosis
(50% or greater) or occlusion.

Skeleton: Degenerative changes of the cervical spine. No aggressive
bone lesion identified.

Other neck: Enhancing 1.3 cm of nodule posterior to the right
thyroid lobe may represent exophytic thyroid nodule versus
parathyroid lesion.

Upper chest: Negative.

Review of the MIP images confirms the above findings

CTA HEAD FINDINGS

Anterior circulation: Calcified plaques are seen in the bilateral
carotid siphon, without hemodynamically stenosis. The bilateral MCA
and ACA vascular tree show no evidence of significant stenosis,
proximal occlusion, aneurysm or vascular malformation. A prominent
right posterior communicating artery is noted.

Posterior circulation: No significant stenosis, proximal occlusion,
aneurysm, or vascular malformation.

Venous sinuses: As permitted by contrast timing, patent.

Anatomic variants: Prominent right posterior communicating artery.

Review of the MIP images confirms the above findings
IMPRESSION: 1. No acute intracranial abnormality.
2. No large vessel occlusion, hemodynamically significant stenosis,
or evidence of dissection in the head or neck.
3. Enhancing 1.3 cm nodule posterior to the right thyroid lobe may
represent exophytic thyroid nodule versus parathyroid lesion.
Recommend nonemergent thyroid ultrasound for further evaluation.
4. Aortic atherosclerosis.

Aortic Atherosclerosis (BOUBA-V3G.G).

## 2021-03-29 IMAGING — US IR US GUIDE VASC ACCESS LEFT
1 series · 2 of 2 positions shown · non-contrast
Comparison: none

INDICATION: History of poor intravenous access and need for IV access for
contrast administration for CT a of the head and neck.

[Series 1: ir us guide vasc access left · 0.04mm/px · 2 of 2 slices shown]
[im 1/2]
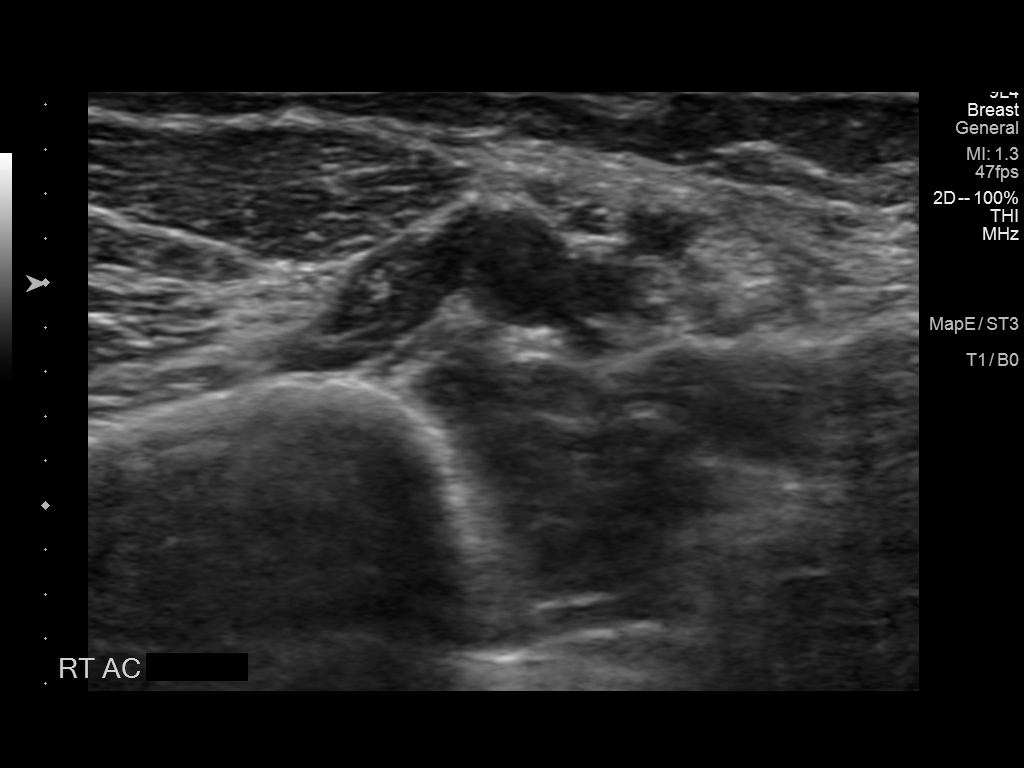
[im 2/2]
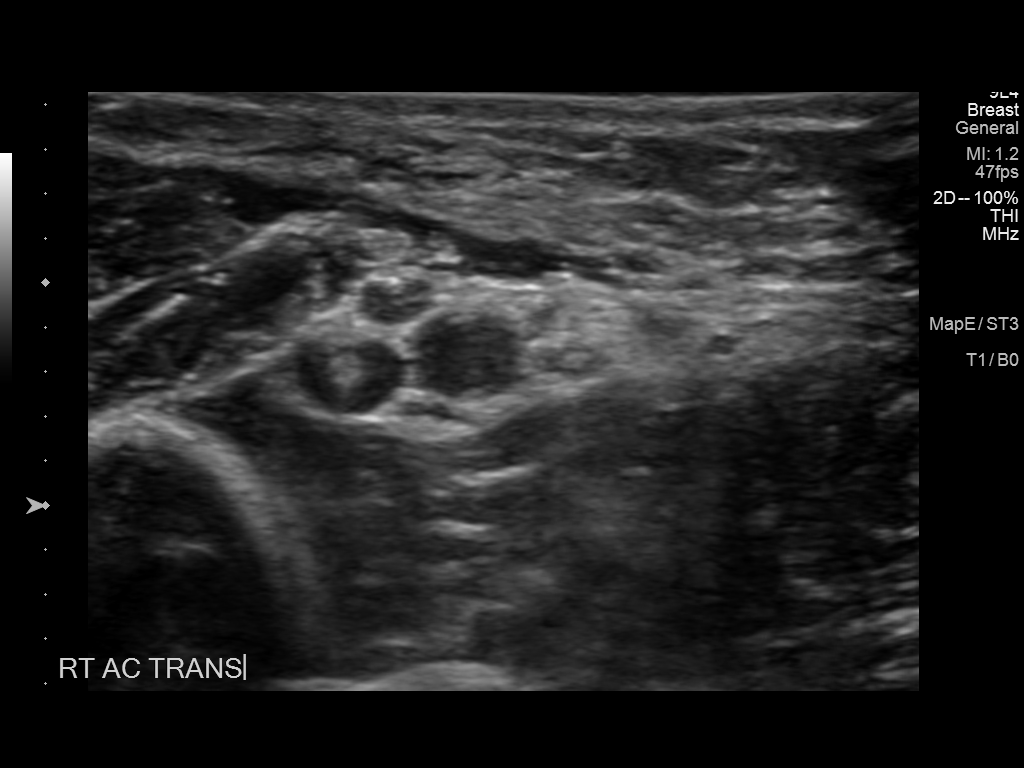

[2 of 2 positions shown; findings below may reference images not displayed]

EXAM:
ULTRASOUND-GUIDED VENIPUNCTURE WITH IV ACCESS PLACEMENT

MEDICATIONS:
None

ANESTHESIA/SEDATION:
None

FLUOROSCOPY TIME:  None

COMPLICATIONS:
None immediate.

PROCEDURE:
Informed written consent was obtained from the patient after a
thorough discussion of the procedural risks, benefits and
alternatives. All questions were addressed. Maximal Sterile Barrier
Technique was utilized including caps, mask, sterile gowns, sterile
gloves, sterile drape, hand hygiene and skin antiseptic. A timeout
was performed prior to the initiation of the procedure. Local
anesthesia was provided with 1% lidocaine.

Ultrasound was used to localize veins in the right upper arm. Under
direct ultrasound guidance, a 21 gauge needle was advanced into the
right brachial vein. After guidewire advancement, a 4 French micro
access dilator was advanced over the wire and capped. The dilator
was aspirated and flushed with sterile saline. The IV access site
was secured with Tegaderm.
IMPRESSION: Intravenous access performed under ultrasound guidance at the level
of the right brachial vein in the upper arm.

## 2021-03-29 IMAGING — CT CT ANGIO HEAD
2 of 11 series · 4 of 33 positions shown · IV contrast (iopamidol)
Comparison: MRI of the brain May 09, 2019.

CLINICAL DATA: Syncope, recurrent.

EXAM:
CT ANGIOGRAPHY HEAD AND NECK
TECHNIQUE: Multidetector CT imaging of the head and neck was performed using
the standard protocol during bolus administration of intravenous
contrast. Multiplanar CT image reconstructions and MIPs were
obtained to evaluate the vascular anatomy. Carotid stenosis
measurements (when applicable) are obtained utilizing NASCET
criteria, using the distal internal carotid diameter as the
denominator.
CONTRAST:  75mL LHM6GA-PE7 IOPAMIDOL (LHM6GA-PE7) INJECTION 76%

[Series 11: brain 3.00 hr40 s3 sag without ibhc · sagittal · non-contrast · 0.35mm/px · 1 of 65 slices shown]
[im 33/65  soft-tissue]
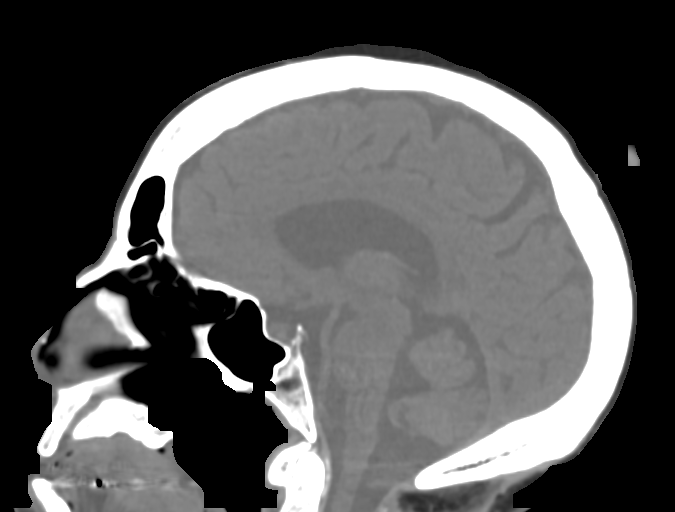

[Series 17: cta head & neck 1.00 hv48 cor thin mips · axial · 0.71mm/px · z∈[-601,-427]mm · 3 of 308 slices shown]
[im 1/308  soft-tissue]
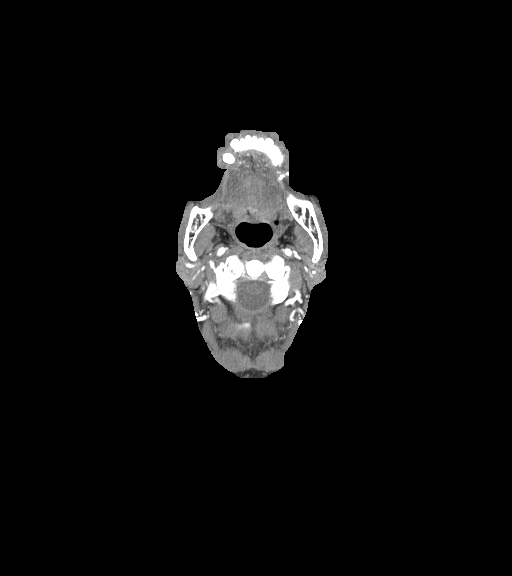
[im 154/308  bone]
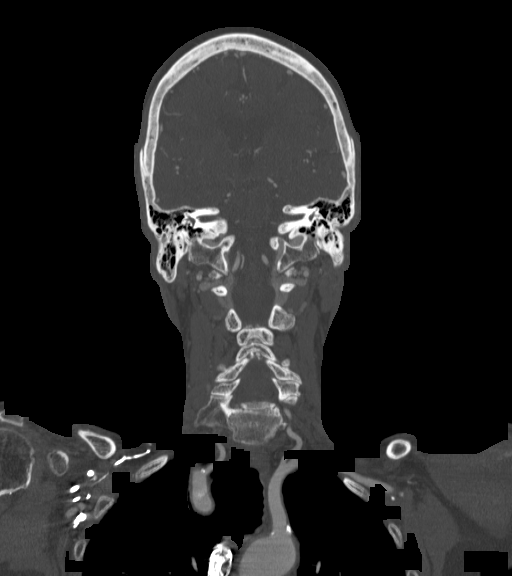
[im 308/308  soft-tissue]
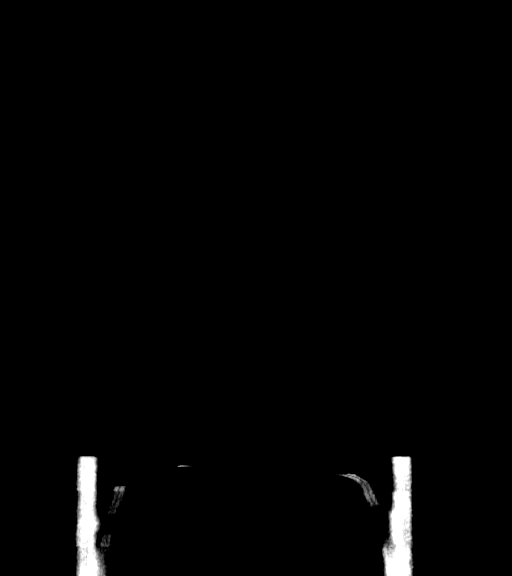

[4 of 33 positions shown; findings below may reference images not displayed]

FINDINGS: CT HEAD FINDINGS

Brain: No evidence of acute infarction, hemorrhage, hydrocephalus,
extra-axial collection or mass lesion/mass effect. Mild confluent
hypodensity of the periventricular white matter, nonspecific, likely
related to chronic small vessel ischemia.

Vascular: Calcified plaques in the bilateral carotid siphons.

Skull: Normal. Negative for fracture or focal lesion.

Sinuses: Mucous retention cyst in the right maxillary sinus.

Orbits: No acute finding.

Review of the MIP images confirms the above findings

CTA NECK FINDINGS

Aortic arch: Standard branching. Imaged portion shows no evidence of
aneurysm or dissection. Atherosclerotic changes are noted in the
aortic arch and origin of the major neck arteries without
hemodynamically significant stenosis.

Right carotid system: Atherosclerotic changes are noted in the right
carotid bifurcation with mixed density plaque, predominantly
calcified, without hemodynamically significant stenosis.

Left carotid system: Atherosclerotic changes in the left carotid
bifurcation with calcified plaques without hemodynamically
significant stenosis.

Vertebral arteries: Codominant. Small calcified plaque at the origin
of right vertebral artery without stenosis. The origin of the left
vertebral artery is maintained. No evidence of dissection, stenosis
(50% or greater) or occlusion.

Skeleton: Degenerative changes of the cervical spine. No aggressive
bone lesion identified.

Other neck: Enhancing 1.3 cm of nodule posterior to the right
thyroid lobe may represent exophytic thyroid nodule versus
parathyroid lesion.

Upper chest: Negative.

Review of the MIP images confirms the above findings

CTA HEAD FINDINGS

Anterior circulation: Calcified plaques are seen in the bilateral
carotid siphon, without hemodynamically stenosis. The bilateral MCA
and ACA vascular tree show no evidence of significant stenosis,
proximal occlusion, aneurysm or vascular malformation. A prominent
right posterior communicating artery is noted.

Posterior circulation: No significant stenosis, proximal occlusion,
aneurysm, or vascular malformation.

Venous sinuses: As permitted by contrast timing, patent.

Anatomic variants: Prominent right posterior communicating artery.

Review of the MIP images confirms the above findings
IMPRESSION: 1. No acute intracranial abnormality.
2. No large vessel occlusion, hemodynamically significant stenosis,
or evidence of dissection in the head or neck.
3. Enhancing 1.3 cm nodule posterior to the right thyroid lobe may
represent exophytic thyroid nodule versus parathyroid lesion.
Recommend nonemergent thyroid ultrasound for further evaluation.
4. Aortic atherosclerosis.

Aortic Atherosclerosis (BOUBA-V3G.G).

## 2021-04-01 NOTE — Telephone Encounter (Signed)
Appeal decision received. Metaxalone approved 03/17/21- 05/03/22. Authorization number is [id number]

## 2021-04-04 ENCOUNTER — Inpatient Hospital Stay: Payer: Medicare Other | Attending: Hematology

## 2021-04-04 DIAGNOSIS — E538 Deficiency of other specified B group vitamins: Secondary | ICD-10-CM | POA: Insufficient documentation

## 2021-04-04 DIAGNOSIS — Z79899 Other long term (current) drug therapy: Secondary | ICD-10-CM | POA: Insufficient documentation

## 2021-04-11 IMAGING — US US THYROID
1 series · 13 of 25 positions shown · non-contrast
Comparison: None.

CLINICAL DATA: 79-year-old female with a history of right thyroid
nodule

EXAM:
THYROID ULTRASOUND
TECHNIQUE: Ultrasound examination of the thyroid gland and adjacent soft
tissues was performed.

[Series 1: us thyroid · 0.05mm/px · 13 of 43 slices shown]
[im 1/43]
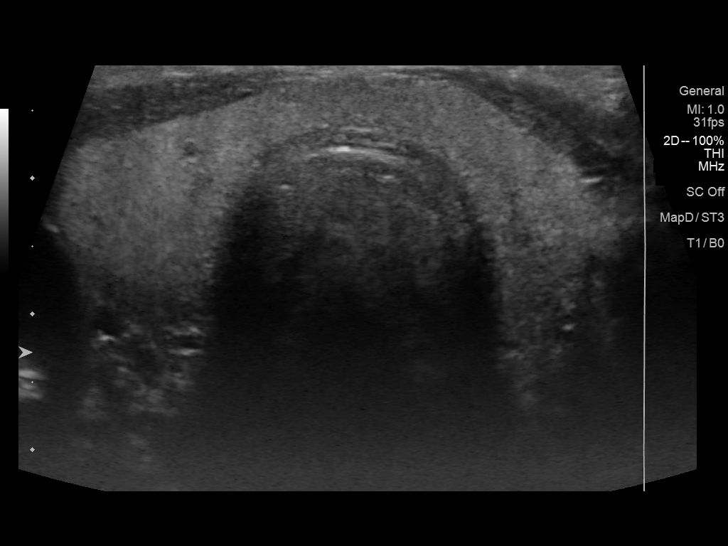
[im 4/43]
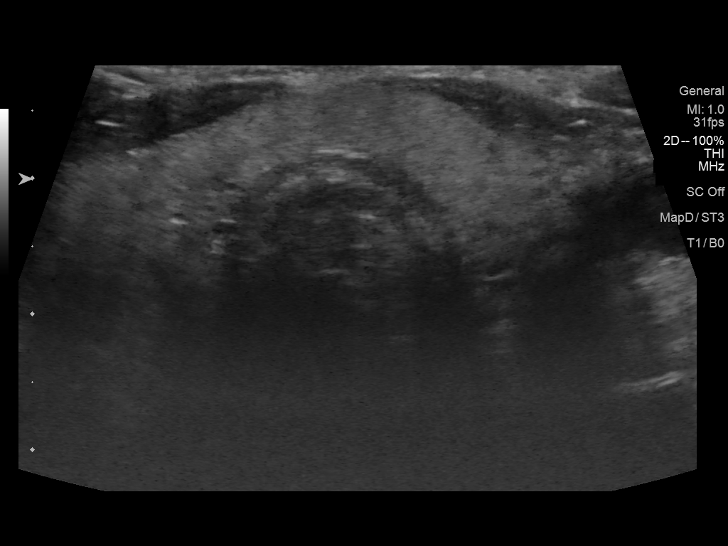
[im 8/43]
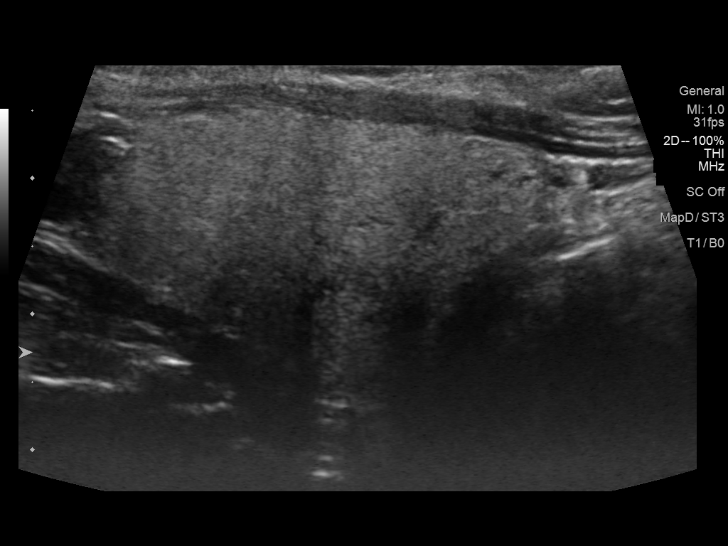
[im 11/43]
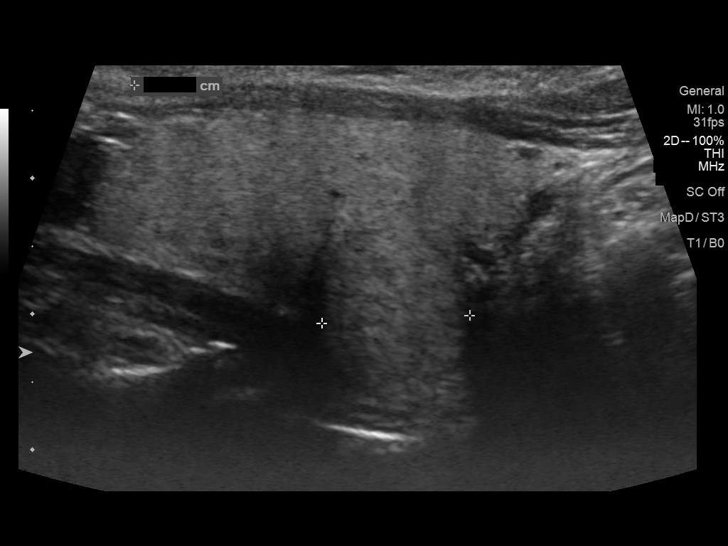
[im 15/43]
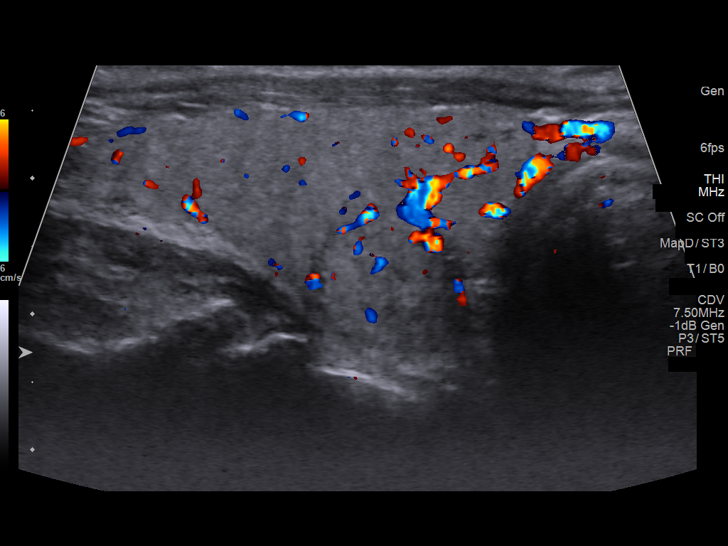
[im 18/43]
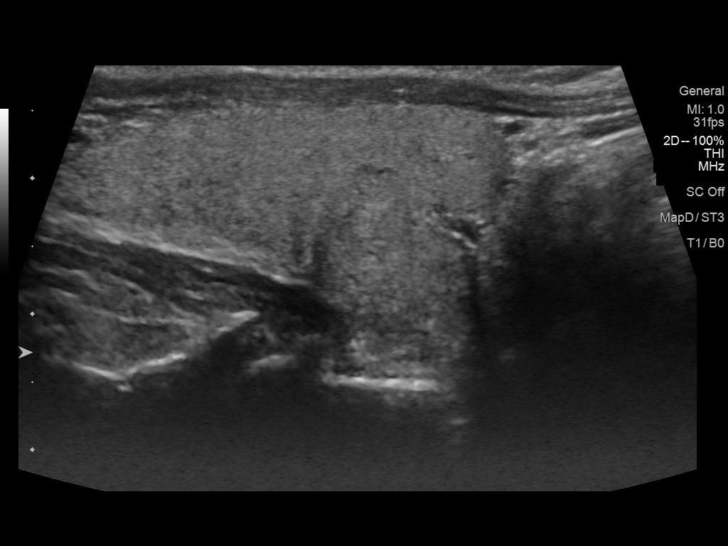
[im 22/43]
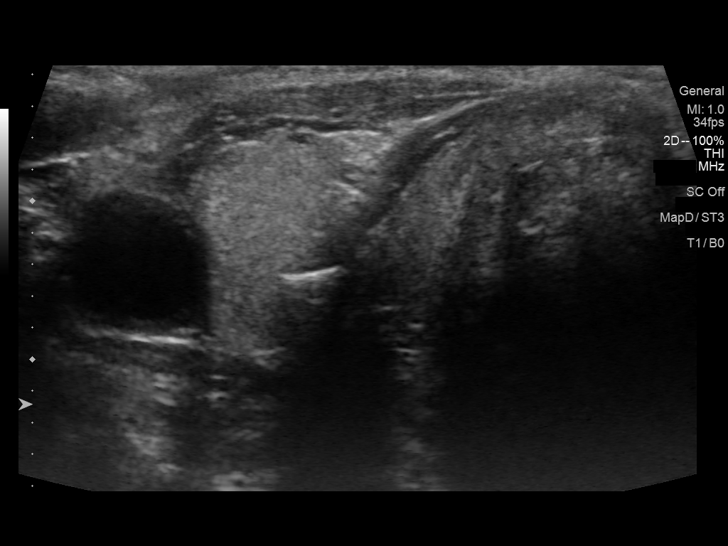
[im 25/43]
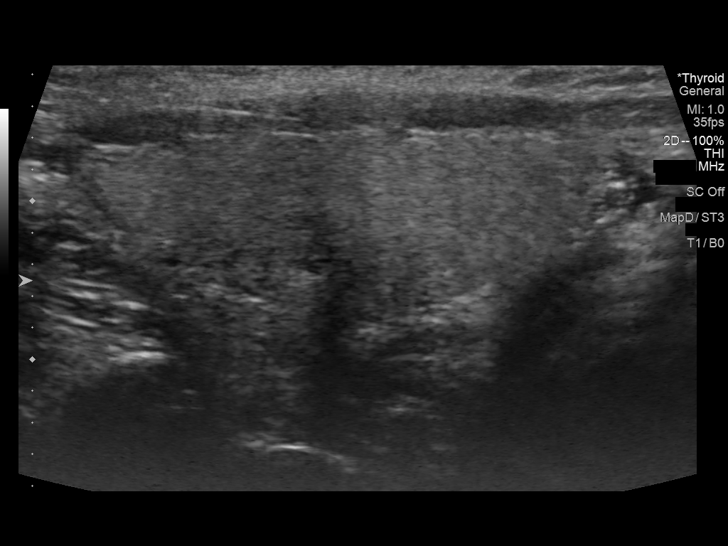
[im 29/43]
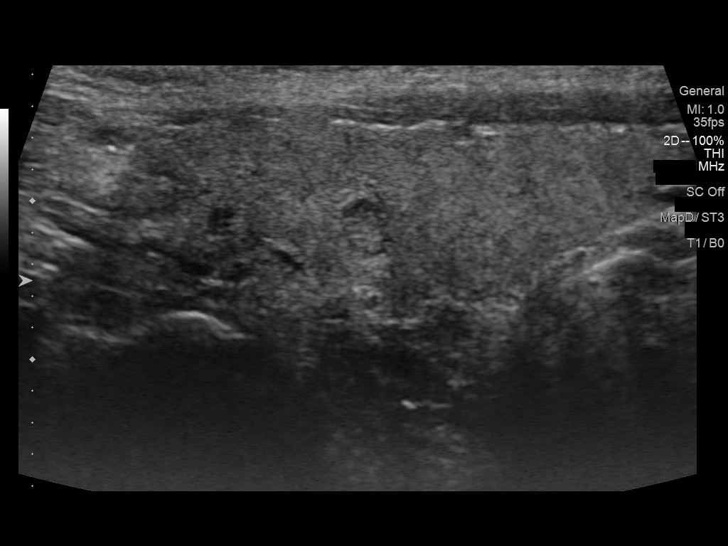
[im 32/43]
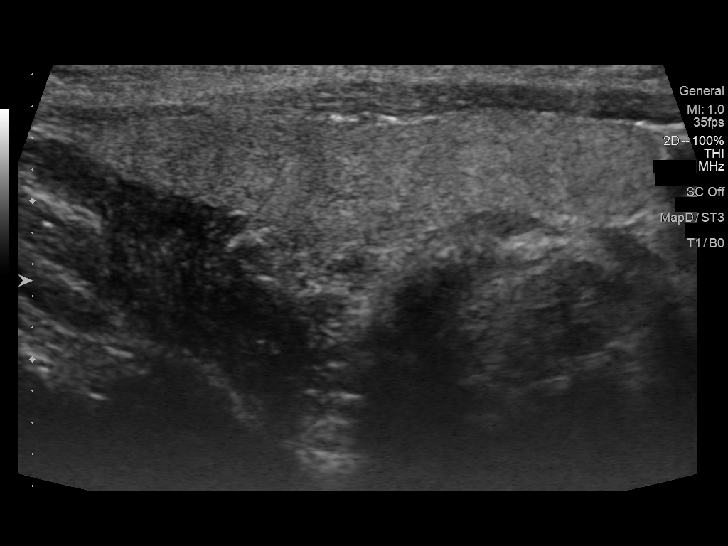
[im 36/43]
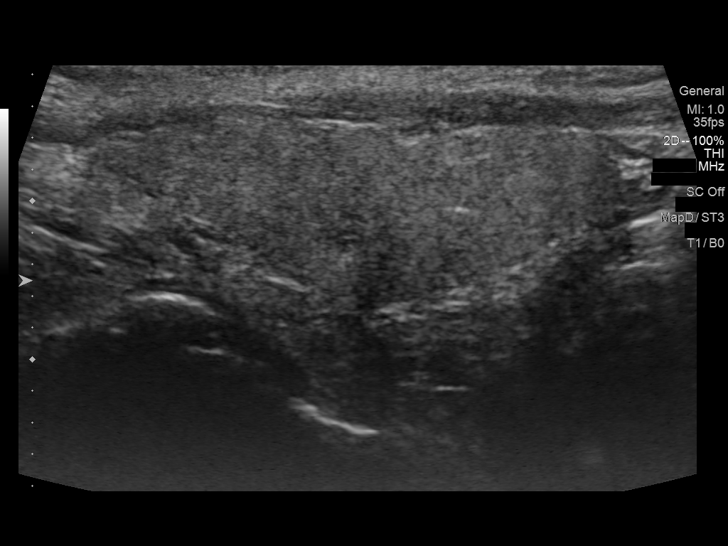
[im 39/43]
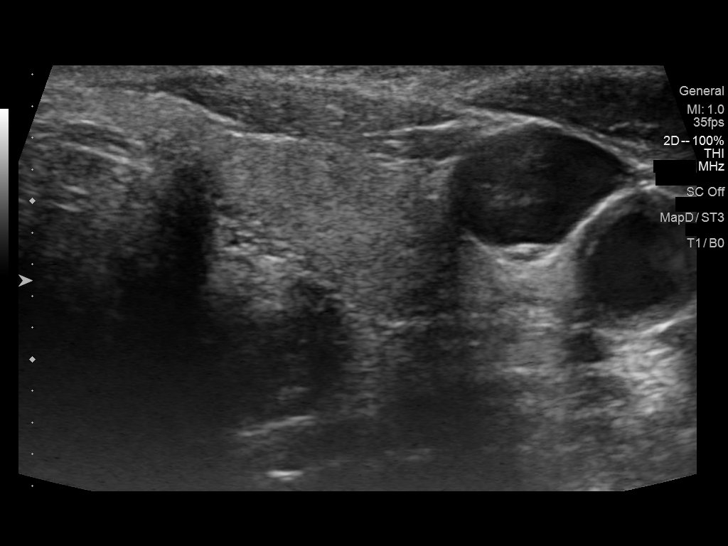
[im 43/43]
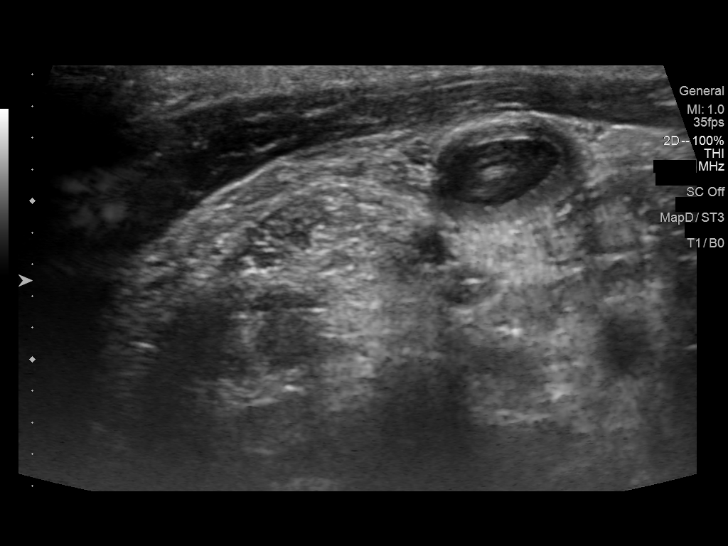

[13 of 25 positions shown; findings below may reference images not displayed]

FINDINGS: Parenchymal Echotexture: Mildly heterogenous

Isthmus: 0.5 cm

Right lobe: 3.6 cm x 2.1 cm x 1.5 cm

Left lobe: 3.4 cm x 1.3 cm x 1.5 cm

_________________________________________________________

Estimated total number of nodules >/= 1 cm: 1

Number of spongiform nodules >/=  2 cm not described below (TR1): 0

Number of mixed cystic and solid nodules >/= 1.5 cm not described
below (TR2): 0

_________________________________________________________

Nodule # 1:

Location: Right; Mid

Maximum size: 1.1 cm; Other 2 dimensions: 1.1 cm x 0.9 cm

Composition: solid/almost completely solid (2)

Echogenicity: isoechoic (1)

Shape: not taller-than-wide (0)

Margins: ill-defined (0)

Echogenic foci: none (0)

ACR TI-RADS total points: 3.

ACR TI-RADS risk category: TR3 (3 points).

ACR TI-RADS recommendations:

Nodule does not meet criteria for surveillance or biopsy

_________________________________________________________

No adenopathy
IMPRESSION: No thyroid nodule meets criteria for biopsy or surveillance, as
designated by the newly established ACR TI-RADS criteria.

Recommendations follow those established by the new ACR TI-RADS
criteria ([HOSPITAL] 2506;[DATE]).

## 2021-04-16 ENCOUNTER — Other Ambulatory Visit: Payer: Self-pay

## 2021-04-16 ENCOUNTER — Encounter
Payer: Medicare Other | Attending: Physical Medicine and Rehabilitation | Admitting: Physical Medicine and Rehabilitation

## 2021-04-16 DIAGNOSIS — R1084 Generalized abdominal pain: Secondary | ICD-10-CM | POA: Diagnosis not present

## 2021-04-16 DIAGNOSIS — C7A8 Other malignant neuroendocrine tumors: Secondary | ICD-10-CM

## 2021-04-16 DIAGNOSIS — R112 Nausea with vomiting, unspecified: Secondary | ICD-10-CM | POA: Diagnosis not present

## 2021-04-16 DIAGNOSIS — K3184 Gastroparesis: Secondary | ICD-10-CM | POA: Diagnosis not present

## 2021-04-16 MED ORDER — PROMETHAZINE HCL 12.5 MG PO TABS
12.5000 mg | ORAL_TABLET | ORAL | 3 refills | Status: DC | PRN
Start: 2021-04-16 — End: 2021-08-18
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
## 2021-04-17 ENCOUNTER — Other Ambulatory Visit: Payer: Self-pay

## 2021-04-17 ENCOUNTER — Encounter (HOSPITAL_BASED_OUTPATIENT_CLINIC_OR_DEPARTMENT_OTHER): Payer: Medicare Other | Admitting: Physical Medicine and Rehabilitation

## 2021-04-17 DIAGNOSIS — C7A8 Other malignant neuroendocrine tumors: Secondary | ICD-10-CM

## 2021-04-17 DIAGNOSIS — R1084 Generalized abdominal pain: Secondary | ICD-10-CM

## 2021-04-18 ENCOUNTER — Inpatient Hospital Stay: Payer: Medicare Other

## 2021-04-18 MED ORDER — ESOMEPRAZOLE MAGNESIUM 20 MG PO CPDR: 20.0000 mg | DELAYED_RELEASE_CAPSULE | Freq: Every day | ORAL | 3 refills | Status: DC | PRN

## 2021-04-18 NOTE — Addendum Note (Signed)
Addended by: Izora Ribas on: 04/18/2021 01:02 PM   Modules accepted: Orders
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
## 2021-04-23 ENCOUNTER — Other Ambulatory Visit: Payer: Self-pay

## 2021-04-23 ENCOUNTER — Inpatient Hospital Stay: Payer: Medicare Other

## 2021-04-23 ENCOUNTER — Inpatient Hospital Stay (HOSPITAL_BASED_OUTPATIENT_CLINIC_OR_DEPARTMENT_OTHER): Payer: Medicare Other | Admitting: Hematology

## 2021-04-23 VITALS — BP 162/85 | HR 79 | Resp 18 | Wt 92.1 lb

## 2021-04-23 DIAGNOSIS — E538 Deficiency of other specified B group vitamins: Secondary | ICD-10-CM | POA: Diagnosis not present

## 2021-04-23 DIAGNOSIS — C7A8 Other malignant neuroendocrine tumors: Secondary | ICD-10-CM | POA: Diagnosis not present

## 2021-04-23 DIAGNOSIS — D519 Vitamin B12 deficiency anemia, unspecified: Secondary | ICD-10-CM

## 2021-04-23 DIAGNOSIS — Z79899 Other long term (current) drug therapy: Secondary | ICD-10-CM | POA: Diagnosis not present

## 2021-04-23 MED ORDER — CYANOCOBALAMIN 1000 MCG/ML IJ SOLN
1000.0000 ug | Freq: Once | INTRAMUSCULAR | Status: AC
  Administered 2021-04-23: 13:00:00 1000 ug via INTRAMUSCULAR
  Filled 2021-04-23: qty 1
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
## 2021-04-24 ENCOUNTER — Telehealth: Payer: Self-pay | Admitting: Hematology

## 2021-04-24 NOTE — Telephone Encounter (Signed)
Scheduled per sch msg. Called and spoke with patient. Confirmed appt  

## 2021-04-30 ENCOUNTER — Encounter: Payer: Self-pay | Admitting: Neurology

## 2021-04-30 ENCOUNTER — Inpatient Hospital Stay: Payer: Medicare Other | Admitting: Nutrition

## 2021-04-30 ENCOUNTER — Telehealth: Payer: Self-pay | Admitting: Nutrition

## 2021-04-30 ENCOUNTER — Ambulatory Visit (INDEPENDENT_AMBULATORY_CARE_PROVIDER_SITE_OTHER): Payer: Medicare Other | Admitting: Neurology

## 2021-04-30 VITALS — BP 166/69 | HR 70 | Ht 63.0 in | Wt 95.0 lb

## 2021-04-30 DIAGNOSIS — R42 Dizziness and giddiness: Secondary | ICD-10-CM | POA: Diagnosis not present

## 2021-04-30 DIAGNOSIS — R269 Unspecified abnormalities of gait and mobility: Secondary | ICD-10-CM | POA: Diagnosis not present

## 2021-04-30 DIAGNOSIS — F444 Conversion disorder with motor symptom or deficit: Secondary | ICD-10-CM

## 2021-04-30 NOTE — Patient Instructions (Signed)
Great to see you today  Return back here in 8 months or sooner if needed

## 2021-04-30 NOTE — Telephone Encounter (Signed)
80 year old female diagnosed with neuroendocrine tumor of the appendix in 2018. Treatment includes Surveillance, Vitamin B 12 and Venofer.  PMH includes ETOH, anemia, anxiety, Barrett's esophagus, depression, IBS, Vitamin B 12 deficiency, Total Gastrectomy in 2012 with J tube placement.  Medications include Vitamin B 12, Cymbalta, Nexium, Pepcid  Labs reviewed.  Height: 5'3". Weight: 92 pounds. UBW: 130 pounds. BMI: 16.32  Noted hx of intermittent abdominal pain. Patient reports some days she eats well and tolerates large portions of food without difficulty. Other days, she tries to eat and cannot swallow it due to taste. She doesn't want it so she won't force it. States she would throw up if she pushed herself to eat.This sometimes happens for 2 or 3 days in a row. She has tried Boost and Ensure but did not like them. States they were too sweet. Patient then asked me to call her back because she had to go to a doctor's appointment.  Nutrition Diagnosis: Unintended wt loss related to inadequate oral intake as evidenced by 17% wt loss in less than 6 months which is significant.  Intervention: Educated to eat small amounts more often, preferably every 2 hours. Explained why she needed to eat more often. Recommended she try Glucerna or equivalent for added calories and protein. Encouraged her to start with part of the shake and increase gradually.  Monitoring, Evaluation, Goals: Oral intake and wt trends  Next Visit: Will try to call patient back next week.
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
## 2021-05-02 ENCOUNTER — Inpatient Hospital Stay: Payer: Medicare Other

## 2021-05-05 ENCOUNTER — Encounter: Payer: Self-pay | Admitting: Hematology

## 2021-05-06 ENCOUNTER — Telehealth: Payer: Self-pay | Admitting: Dietician

## 2021-05-06 NOTE — Telephone Encounter (Signed)
Nutrition Follow-up:  Patient currently under surveillance for neuroendocrine tumor of appendix.   Patient reports her appetite has been good the past few days, eating 2 meals/day. Patient does not wake up until noon and typically eats lunch and dinner. Ate a small bowl of chili beans last night and was full after eating half. Ate some ribs and mac/cheese after shopping with daughter yesterday afternoon. She did not like the ribs too much, but ate them anyway. Patient reports chronic constipation, she is taking Linzess, this is not working that great. Patient reports having "good bowel movement after taking ferrous sulfate."  Her last bowel movement was Saturday, recalls 2 bowel movements/week as normal for her. She drinks 2 Mt. Dews daily. Patient does not like water, drank a bottle of water on Sunday. Patient reports decreased urination 3-4 months, she recalls urinating every 5 hours, urine is light yellow. Patient has appointment with PCP in 2 weeks to discuss this.   Medications: reviewed  Labs: no new labs for review   Anthropometrics: Last weight 95 lb on 12/28 increased from 92 lb 2 oz on 12/21   NUTRITION DIAGNOSIS: Unintended weight loss stable    INTERVENTION:  Encouraged eating small frequent meals and snacks q2 hours - will mail snack ideas Discussed strategies for constipation and encouraged increasing intake of fluids - will mail handout with tips Suggested pt try cutting Ensure/Boost with milk to aid with sweet taste, she has not tried Glucerna supplement - will mail coupons Contact information provided     MONITORING, EVALUATION, GOAL: weight trends, intake   NEXT VISIT: via telephone ~6 weeks

## 2021-05-08 ENCOUNTER — Encounter: Payer: Self-pay | Admitting: Hematology

## 2021-05-08 ENCOUNTER — Other Ambulatory Visit: Payer: Self-pay

## 2021-05-08 ENCOUNTER — Other Ambulatory Visit: Payer: Medicare Other | Admitting: Hospice

## 2021-05-08 DIAGNOSIS — D519 Vitamin B12 deficiency anemia, unspecified: Secondary | ICD-10-CM

## 2021-05-08 DIAGNOSIS — C7A8 Other malignant neuroendocrine tumors: Secondary | ICD-10-CM

## 2021-05-08 DIAGNOSIS — R630 Anorexia: Secondary | ICD-10-CM

## 2021-05-08 DIAGNOSIS — R109 Unspecified abdominal pain: Secondary | ICD-10-CM

## 2021-05-08 DIAGNOSIS — G8929 Other chronic pain: Secondary | ICD-10-CM

## 2021-05-08 DIAGNOSIS — Z515 Encounter for palliative care: Secondary | ICD-10-CM
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
## 2021-05-09 ENCOUNTER — Inpatient Hospital Stay: Payer: Medicare Other | Attending: Hematology

## 2021-05-09 ENCOUNTER — Encounter: Payer: Self-pay | Admitting: Hematology

## 2021-05-09 DIAGNOSIS — K449 Diaphragmatic hernia without obstruction or gangrene: Secondary | ICD-10-CM | POA: Diagnosis not present

## 2021-05-09 DIAGNOSIS — R918 Other nonspecific abnormal finding of lung field: Secondary | ICD-10-CM | POA: Insufficient documentation

## 2021-05-09 DIAGNOSIS — I700 Atherosclerosis of aorta: Secondary | ICD-10-CM | POA: Insufficient documentation

## 2021-05-09 DIAGNOSIS — K5900 Constipation, unspecified: Secondary | ICD-10-CM | POA: Diagnosis not present

## 2021-05-09 DIAGNOSIS — R251 Tremor, unspecified: Secondary | ICD-10-CM | POA: Diagnosis not present

## 2021-05-09 DIAGNOSIS — C181 Malignant neoplasm of appendix: Secondary | ICD-10-CM | POA: Diagnosis present

## 2021-05-09 DIAGNOSIS — Z9049 Acquired absence of other specified parts of digestive tract: Secondary | ICD-10-CM | POA: Diagnosis not present

## 2021-05-09 DIAGNOSIS — Z79899 Other long term (current) drug therapy: Secondary | ICD-10-CM | POA: Diagnosis not present

## 2021-05-09 DIAGNOSIS — I779 Disorder of arteries and arterioles, unspecified: Secondary | ICD-10-CM | POA: Insufficient documentation

## 2021-05-09 DIAGNOSIS — R42 Dizziness and giddiness: Secondary | ICD-10-CM | POA: Diagnosis not present

## 2021-05-09 DIAGNOSIS — N281 Cyst of kidney, acquired: Secondary | ICD-10-CM | POA: Insufficient documentation

## 2021-05-09 DIAGNOSIS — Z87891 Personal history of nicotine dependence: Secondary | ICD-10-CM | POA: Insufficient documentation

## 2021-05-09 DIAGNOSIS — E538 Deficiency of other specified B group vitamins: Secondary | ICD-10-CM | POA: Insufficient documentation

## 2021-05-09 DIAGNOSIS — I2510 Atherosclerotic heart disease of native coronary artery without angina pectoris: Secondary | ICD-10-CM | POA: Diagnosis not present

## 2021-05-09 DIAGNOSIS — R296 Repeated falls: Secondary | ICD-10-CM | POA: Diagnosis not present

## 2021-05-09 DIAGNOSIS — D519 Vitamin B12 deficiency anemia, unspecified: Secondary | ICD-10-CM

## 2021-05-09 DIAGNOSIS — D509 Iron deficiency anemia, unspecified: Secondary | ICD-10-CM | POA: Insufficient documentation

## 2021-05-09 MED ORDER — CYANOCOBALAMIN 1000 MCG/ML IJ SOLN
1000.0000 ug | Freq: Once | INTRAMUSCULAR | Status: AC
  Administered 2021-05-09: 1000 ug via INTRAMUSCULAR
  Filled 2021-05-09: qty 1

## 2021-05-12 ENCOUNTER — Ambulatory Visit
Admission: RE | Admit: 2021-05-12 | Discharge: 2021-05-12 | Disposition: A | Discharge status: Home / Self Care | Payer: Commercial Managed Care - HMO | Source: Ambulatory Visit | Attending: Physical Medicine and Rehabilitation | Admitting: Physical Medicine and Rehabilitation

## 2021-05-12 ENCOUNTER — Telehealth: Payer: Self-pay | Admitting: *Deleted

## 2021-05-12 ENCOUNTER — Encounter: Payer: Self-pay | Admitting: Hematology

## 2021-05-12 ENCOUNTER — Other Ambulatory Visit: Payer: Self-pay

## 2021-05-12 ENCOUNTER — Other Ambulatory Visit: Payer: Self-pay | Admitting: Hematology

## 2021-05-12 ENCOUNTER — Inpatient Hospital Stay: Admission: RE | Admit: 2021-05-12 | Payer: Medicare Other | Source: Ambulatory Visit

## 2021-05-12 ENCOUNTER — Inpatient Hospital Stay: Payer: Medicare Other

## 2021-05-12 DIAGNOSIS — C7A8 Other malignant neuroendocrine tumors: Secondary | ICD-10-CM

## 2021-05-12 DIAGNOSIS — D508 Other iron deficiency anemias: Secondary | ICD-10-CM

## 2021-05-12 DIAGNOSIS — D649 Anemia, unspecified: Secondary | ICD-10-CM

## 2021-05-12 DIAGNOSIS — C181 Malignant neoplasm of appendix: Secondary | ICD-10-CM | POA: Diagnosis not present

## 2021-05-12 DIAGNOSIS — D519 Vitamin B12 deficiency anemia, unspecified: Secondary | ICD-10-CM

## 2021-05-12 LAB — IRON AND IRON BINDING CAPACITY (CC-WL,HP ONLY)
Iron: 103 ug/dL (ref 28–170)
Saturation Ratios: 48 % — ABNORMAL HIGH (ref 10.4–31.8)
TIBC: 217 ug/dL — ABNORMAL LOW (ref 250–450)
UIBC: 114 ug/dL — ABNORMAL LOW (ref 148–442)

## 2021-05-12 LAB — CBC WITH DIFFERENTIAL/PLATELET
Abs Immature Granulocytes: 0.01 10*3/uL (ref 0.00–0.07)
Basophils Absolute: 0 10*3/uL (ref 0.0–0.1)
Basophils Relative: 0 %
Eosinophils Absolute: 0 10*3/uL (ref 0.0–0.5)
Eosinophils Relative: 0 %
HCT: 31.2 % — ABNORMAL LOW (ref 36.0–46.0)
Hemoglobin: 10.3 g/dL — ABNORMAL LOW (ref 12.0–15.0)
Immature Granulocytes: 0 %
Lymphocytes Relative: 25 %
Lymphs Abs: 1.8 10*3/uL (ref 0.7–4.0)
MCH: 25.1 pg — ABNORMAL LOW (ref 26.0–34.0)
MCHC: 33 g/dL (ref 30.0–36.0)
MCV: 76.1 fL — ABNORMAL LOW (ref 80.0–100.0)
Monocytes Absolute: 0.3 10*3/uL (ref 0.1–1.0)
Monocytes Relative: 5 %
Neutro Abs: 4.8 10*3/uL (ref 1.7–7.7)
Neutrophils Relative %: 70 %
Platelets: 234 10*3/uL (ref 150–400)
RBC: 4.1 MIL/uL (ref 3.87–5.11)
RDW: 15.6 % — ABNORMAL HIGH (ref 11.5–15.5)
WBC: 6.9 10*3/uL (ref 4.0–10.5)
nRBC: 0 % (ref 0.0–0.2)

## 2021-05-12 LAB — COMPREHENSIVE METABOLIC PANEL
ALT: 14 U/L (ref 0–44)
AST: 30 U/L (ref 15–41)
Albumin: 3.2 g/dL — ABNORMAL LOW (ref 3.5–5.0)
Alkaline Phosphatase: 69 U/L (ref 38–126)
Anion gap: 7 (ref 5–15)
BUN: 20 mg/dL (ref 8–23)
CO2: 23 mmol/L (ref 22–32)
Calcium: 8.3 mg/dL — ABNORMAL LOW (ref 8.9–10.3)
Chloride: 107 mmol/L (ref 98–111)
Creatinine, Ser: 1.12 mg/dL — ABNORMAL HIGH (ref 0.44–1.00)
GFR, Estimated: 50 mL/min — ABNORMAL LOW (ref >60)
Glucose, Bld: 81 mg/dL (ref 70–99)
Potassium: 4.6 mmol/L (ref 3.5–5.1)
Sodium: 137 mmol/L (ref 135–145)
Total Bilirubin: 0.3 mg/dL (ref 0.3–1.2)
Total Protein: 5.9 g/dL — ABNORMAL LOW (ref 6.5–8.1)

## 2021-05-12 LAB — FERRITIN: Ferritin: 211 ng/mL (ref 11–307)

## 2021-05-12 NOTE — Telephone Encounter (Signed)
[facility] called to advise that patient has drank oral contrast but they have been unable numerous times to get IV stick for patient to complete CT Chest abdomen pelvis w contrast.    Per Dr Burr Medico ok to proceed without contrast.  New order placed.  Previous order shows no PA required.

## 2021-05-13 LAB — CHROMOGRANIN A: Chromogranin A (ng/mL): 94.5 ng/mL (ref 0.0–101.8)

## 2021-05-14 ENCOUNTER — Encounter: Payer: Self-pay | Admitting: Registered Nurse

## 2021-05-14 ENCOUNTER — Encounter: Payer: Self-pay | Admitting: Hematology

## 2021-05-14 ENCOUNTER — Inpatient Hospital Stay (HOSPITAL_BASED_OUTPATIENT_CLINIC_OR_DEPARTMENT_OTHER): Payer: Medicare Other | Admitting: Hematology

## 2021-05-14 ENCOUNTER — Encounter: Payer: Medicare Other | Attending: Physical Medicine and Rehabilitation | Admitting: Registered Nurse

## 2021-05-14 ENCOUNTER — Other Ambulatory Visit: Payer: Self-pay

## 2021-05-14 VITALS — BP 179/90 | HR 86 | Temp 98.7°F | Ht 63.0 in | Wt 99.0 lb

## 2021-05-14 DIAGNOSIS — Z79891 Long term (current) use of opiate analgesic: Secondary | ICD-10-CM | POA: Diagnosis present

## 2021-05-14 DIAGNOSIS — G894 Chronic pain syndrome: Secondary | ICD-10-CM

## 2021-05-14 DIAGNOSIS — R1084 Generalized abdominal pain: Secondary | ICD-10-CM | POA: Diagnosis present

## 2021-05-14 DIAGNOSIS — R112 Nausea with vomiting, unspecified: Secondary | ICD-10-CM | POA: Diagnosis present

## 2021-05-14 DIAGNOSIS — C7A8 Other malignant neuroendocrine tumors: Secondary | ICD-10-CM | POA: Diagnosis present

## 2021-05-14 DIAGNOSIS — K3184 Gastroparesis: Secondary | ICD-10-CM | POA: Diagnosis present

## 2021-05-14 DIAGNOSIS — Z5181 Encounter for therapeutic drug level monitoring: Secondary | ICD-10-CM

## 2021-05-14 DIAGNOSIS — I10 Essential (primary) hypertension: Secondary | ICD-10-CM

## 2021-05-14 LAB — METHYLMALONIC ACID, SERUM: Methylmalonic Acid, Quantitative: 200 nmol/L (ref 0–378)

## 2021-05-14 MED ORDER — OXYCODONE-ACETAMINOPHEN 7.5-325 MG PO TABS: 1.0000 | ORAL_TABLET | Freq: Two times a day (BID) | ORAL | 0 refills | Status: DC | PRN
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
## 2021-05-16 ENCOUNTER — Inpatient Hospital Stay: Payer: Medicare Other

## 2021-05-16 ENCOUNTER — Telehealth: Payer: Self-pay

## 2021-05-16 ENCOUNTER — Telehealth: Payer: Self-pay | Admitting: Hematology

## 2021-05-16 ENCOUNTER — Encounter: Payer: Medicare Other | Admitting: Dietician

## 2021-05-16 NOTE — Telephone Encounter (Signed)
Followed up with pt about constant diarrhea since taking the contrast for her CT on Monday. (3-4 days) She states she is hydrating with Pedialyte and has only taken one dose of her imodium today. Also she states she was told by EMS that she may be dehydrated. This LPN explained to the pt she should be taking the imodium four times daily as needed for diarrhea and to keep staying hydrated. Pt was told to call back with any further concerns. Pt verbalized understanding.

## 2021-05-16 NOTE — Telephone Encounter (Signed)
Attempted to contact patient to schedule appointment per 1/11 los. No answer so left voicemail for patient to call [facility] back to schedule.

## 2021-05-19 ENCOUNTER — Telehealth: Payer: Self-pay | Admitting: Hematology

## 2021-05-19 NOTE — Telephone Encounter (Signed)
Left message with follow-up appointment per 11/1 los. 

## 2021-05-21 ENCOUNTER — Other Ambulatory Visit: Payer: Self-pay | Admitting: Gastroenterology

## 2021-05-22 ENCOUNTER — Encounter (HOSPITAL_BASED_OUTPATIENT_CLINIC_OR_DEPARTMENT_OTHER): Payer: Medicare Other | Admitting: Physical Medicine and Rehabilitation

## 2021-05-22 ENCOUNTER — Other Ambulatory Visit: Payer: Self-pay

## 2021-05-22 DIAGNOSIS — K3184 Gastroparesis: Secondary | ICD-10-CM

## 2021-05-22 DIAGNOSIS — R112 Nausea with vomiting, unspecified: Secondary | ICD-10-CM | POA: Diagnosis not present

## 2021-05-22 DIAGNOSIS — G894 Chronic pain syndrome: Secondary | ICD-10-CM | POA: Diagnosis not present

## 2021-05-22 MED ORDER — DIPHENHYDRAMINE HCL 12.5 MG PO CHEW: 12.5000 mg | CHEWABLE_TABLET | Freq: Four times a day (QID) | ORAL | 0 refills | Status: DC | PRN

## 2021-05-22 MED ORDER — OXYBUTYNIN CHLORIDE ER 5 MG PO TB24: 5.0000 mg | ORAL_TABLET | Freq: Every day | ORAL | 3 refills | Status: DC
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
## 2021-05-23 ENCOUNTER — Inpatient Hospital Stay: Payer: Medicare Other

## 2021-05-23 ENCOUNTER — Encounter: Payer: Self-pay | Admitting: Hematology

## 2021-05-26 ENCOUNTER — Ambulatory Visit (HOSPITAL_COMMUNITY)
Admission: RE | Admit: 2021-05-26 | Discharge: 2021-05-26 | Disposition: A | Discharge status: Home / Self Care | Payer: Medicare Other | Source: Ambulatory Visit | Attending: Hematology | Admitting: Hematology

## 2021-05-26 ENCOUNTER — Other Ambulatory Visit: Payer: Self-pay | Admitting: Hematology

## 2021-05-26 ENCOUNTER — Encounter: Payer: Self-pay | Admitting: Hematology

## 2021-05-26 ENCOUNTER — Other Ambulatory Visit: Payer: Self-pay

## 2021-05-26 DIAGNOSIS — C7A8 Other malignant neuroendocrine tumors: Secondary | ICD-10-CM | POA: Insufficient documentation

## 2021-05-26 MED ORDER — GADOBUTROL 1 MMOL/ML IV SOLN
5.0000 mL | Freq: Once | INTRAVENOUS | Status: AC | PRN
  Administered 2021-05-26: 5 mL via INTRAVENOUS

## 2021-05-27 ENCOUNTER — Ambulatory Visit (INDEPENDENT_AMBULATORY_CARE_PROVIDER_SITE_OTHER): Payer: Medicare Other | Admitting: Podiatry

## 2021-05-27 ENCOUNTER — Encounter: Payer: Self-pay | Admitting: Podiatry

## 2021-05-27 DIAGNOSIS — M79675 Pain in left toe(s): Secondary | ICD-10-CM | POA: Diagnosis not present

## 2021-05-27 DIAGNOSIS — E0842 Diabetes mellitus due to underlying condition with diabetic polyneuropathy: Secondary | ICD-10-CM

## 2021-05-27 DIAGNOSIS — N179 Acute kidney failure, unspecified: Secondary | ICD-10-CM | POA: Diagnosis not present

## 2021-05-27 DIAGNOSIS — M79674 Pain in right toe(s): Secondary | ICD-10-CM

## 2021-05-27 DIAGNOSIS — B351 Tinea unguium: Secondary | ICD-10-CM

## 2021-05-27 NOTE — Progress Notes (Signed)
This patient returns to my office for at risk foot care.  This patient requires this care by a professional since this patient will be at risk due to having  Diabetes.  This patient is unable to cut nails herself since the patient cannot reach her nails.These nails are painful walking and wearing shoes.  This patient presents for at risk foot care today.   General Appearance  Alert, conversant and in no acute stress.  Vascular  Dorsalis pedis and posterior tibial  pulses are palpable  bilaterally.  Capillary return is within normal limits  bilaterally. Temperature is within normal limits  bilaterally.  Neurologic  Senn-Weinstein monofilament wire test absent   bilaterally. Muscle power within normal limits bilaterally.  Nails Thick disfigured discolored nails with subungual debris  from hallux to fifth toes bilaterally. No evidence of bacterial infection or drainage bilaterally.  Orthopedic  No limitations of motion  feet .  No crepitus or effusions noted.  No bony pathology or digital deformities noted.  Skin  normotropic skin with no porokeratosis noted bilaterally.  No signs of infections or ulcers noted.   Peeling noted plantar right heel.  Onychomycosis  Pain in right toes  Pain in left toes  Consent was obtained for treatment procedures.   Mechanical debridement of nails 1-5  bilaterally performed with a nail nipper.  Filed with dremel without incident.  Discussed the peeling with this patient.  The peeling is a chronic problem which has been treated with lotrisone, cortaid and amlactin.  Told her to try lotrisone again.   Return office visit   3 months                   Told patient to return for periodic foot care and evaluation due to potential at risk complications.   Gardiner Barefoot DPM

## 2021-05-29 ENCOUNTER — Inpatient Hospital Stay: Payer: Medicare Other

## 2021-05-29 ENCOUNTER — Other Ambulatory Visit: Payer: Self-pay

## 2021-05-29 ENCOUNTER — Inpatient Hospital Stay (HOSPITAL_BASED_OUTPATIENT_CLINIC_OR_DEPARTMENT_OTHER): Payer: Medicare Other | Admitting: Hematology

## 2021-05-29 VITALS — BP 181/79 | HR 73 | Temp 98.4°F | Resp 18 | Ht 63.0 in | Wt 100.9 lb

## 2021-05-29 DIAGNOSIS — C7A8 Other malignant neuroendocrine tumors: Secondary | ICD-10-CM | POA: Diagnosis not present

## 2021-05-29 DIAGNOSIS — C181 Malignant neoplasm of appendix: Secondary | ICD-10-CM | POA: Diagnosis not present

## 2021-05-29 DIAGNOSIS — D519 Vitamin B12 deficiency anemia, unspecified: Secondary | ICD-10-CM

## 2021-05-29 MED ORDER — CYANOCOBALAMIN 1000 MCG/ML IJ SOLN
1000.0000 ug | Freq: Once | INTRAMUSCULAR | Status: AC
  Administered 2021-05-29: 1000 ug via INTRAMUSCULAR
  Filled 2021-05-29: qty 1
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
## 2021-05-30 ENCOUNTER — Ambulatory Visit: Payer: Medicare Other

## 2021-05-30 ENCOUNTER — Inpatient Hospital Stay: Payer: Medicare Other

## 2021-05-31 ENCOUNTER — Encounter: Payer: Self-pay | Admitting: Hematology

## 2021-06-06 ENCOUNTER — Inpatient Hospital Stay: Payer: Medicare Other

## 2021-06-09 ENCOUNTER — Encounter: Payer: Self-pay | Admitting: Hematology

## 2021-06-11 ENCOUNTER — Encounter: Payer: Medicare Other | Admitting: Registered Nurse

## 2021-06-11 DIAGNOSIS — S52509A Unspecified fracture of the lower end of unspecified radius, initial encounter for closed fracture: Secondary | ICD-10-CM | POA: Insufficient documentation

## 2021-06-13 ENCOUNTER — Ambulatory Visit: Payer: Medicare Other

## 2021-06-13 ENCOUNTER — Encounter: Payer: Medicare Other | Admitting: Registered Nurse

## 2021-06-13 ENCOUNTER — Telehealth: Payer: Self-pay

## 2021-06-13 MED ORDER — OXYCODONE-ACETAMINOPHEN 7.5-325 MG PO TABS: 1.0000 | ORAL_TABLET | Freq: Every day | ORAL | 0 refills | Status: DC | PRN

## 2021-06-13 NOTE — Telephone Encounter (Signed)
[name] was scheduled for an appointment today, she no showed for her appointment. She called office looking for a refill. She reports she was having abdominal pain and vomiting.  Dr [name] note was reviewed, this provider spoke with Dr [name], regarding the above. [name] is aware her medication was sent to pharmacy and verbalizes understanding.  She is scheduled for F/U appointment on 07/01/2020, she verbalizes understanding.

## 2021-06-13 NOTE — Telephone Encounter (Addendum)
Patient's nurse called and requested a refill on the pain medication. Per PMP, Percocet 7.5-325 mg was filled 05/14/21. Spoke with patient and she stated she did not come for her appointment on today 06/13/21 because she had abdominal pain and had nausea and vomiting

## 2021-06-16 ENCOUNTER — Other Ambulatory Visit: Payer: Self-pay | Admitting: Gastroenterology

## 2021-06-16 ENCOUNTER — Other Ambulatory Visit: Payer: Medicare Other | Admitting: Hospice

## 2021-06-16 ENCOUNTER — Other Ambulatory Visit: Payer: Self-pay

## 2021-06-16 DIAGNOSIS — K582 Mixed irritable bowel syndrome: Secondary | ICD-10-CM

## 2021-06-16 DIAGNOSIS — G8929 Other chronic pain: Secondary | ICD-10-CM

## 2021-06-16 DIAGNOSIS — R109 Unspecified abdominal pain: Secondary | ICD-10-CM

## 2021-06-16 DIAGNOSIS — G4700 Insomnia, unspecified: Secondary | ICD-10-CM

## 2021-06-16 DIAGNOSIS — Z515 Encounter for palliative care: Secondary | ICD-10-CM
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
## 2021-06-20 ENCOUNTER — Inpatient Hospital Stay: Payer: Medicare Other | Attending: Hematology

## 2021-06-20 ENCOUNTER — Other Ambulatory Visit: Payer: Self-pay

## 2021-06-20 DIAGNOSIS — E538 Deficiency of other specified B group vitamins: Secondary | ICD-10-CM | POA: Diagnosis present

## 2021-06-20 DIAGNOSIS — D519 Vitamin B12 deficiency anemia, unspecified: Secondary | ICD-10-CM

## 2021-06-20 MED ORDER — CYANOCOBALAMIN 1000 MCG/ML IJ SOLN
1000.0000 ug | Freq: Once | INTRAMUSCULAR | Status: AC
  Administered 2021-06-20: 1000 ug via INTRAMUSCULAR
  Filled 2021-06-20: qty 1

## 2021-06-23 ENCOUNTER — Other Ambulatory Visit: Payer: Self-pay

## 2021-06-23 ENCOUNTER — Encounter
Payer: Medicare Other | Attending: Physical Medicine and Rehabilitation | Admitting: Physical Medicine and Rehabilitation

## 2021-06-23 ENCOUNTER — Other Ambulatory Visit: Payer: Self-pay | Admitting: Physical Medicine and Rehabilitation

## 2021-06-23 DIAGNOSIS — R1084 Generalized abdominal pain: Secondary | ICD-10-CM | POA: Diagnosis not present

## 2021-06-23 DIAGNOSIS — R630 Anorexia: Secondary | ICD-10-CM | POA: Diagnosis not present

## 2021-06-23 DIAGNOSIS — G894 Chronic pain syndrome: Secondary | ICD-10-CM | POA: Insufficient documentation

## 2021-06-23 DIAGNOSIS — Z5181 Encounter for therapeutic drug level monitoring: Secondary | ICD-10-CM | POA: Insufficient documentation

## 2021-06-23 DIAGNOSIS — Z79891 Long term (current) use of opiate analgesic: Secondary | ICD-10-CM | POA: Insufficient documentation

## 2021-06-23 DIAGNOSIS — F5101 Primary insomnia: Secondary | ICD-10-CM | POA: Insufficient documentation

## 2021-06-23 MED ORDER — MEGESTROL ACETATE 400 MG/10ML PO SUSP: 400.0000 mg | Freq: Every day | ORAL | 0 refills | Status: DC

## 2021-06-23 MED ORDER — MELATONIN 3 MG PO CAPS: 1.0000 | ORAL_CAPSULE | Freq: Every evening | ORAL | 3 refills | Status: AC

## 2021-06-24 ENCOUNTER — Other Ambulatory Visit: Payer: Self-pay

## 2021-06-24 ENCOUNTER — Encounter (HOSPITAL_BASED_OUTPATIENT_CLINIC_OR_DEPARTMENT_OTHER): Payer: Medicare Other | Admitting: Physical Medicine and Rehabilitation

## 2021-06-24 DIAGNOSIS — R1084 Generalized abdominal pain: Secondary | ICD-10-CM
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
## 2021-06-24 NOTE — Progress Notes (Signed)
Discussed incentive spirometer use and follow-up with Dr Silverio Decamp to review her abdominal MRI

## 2021-06-26 ENCOUNTER — Telehealth: Payer: Self-pay | Admitting: Gastroenterology

## 2021-06-26 NOTE — Telephone Encounter (Signed)
No acute findings, I will discuss with patient during office visit. Unfortunately she has had multiple surgeries and the findings are sequelae of that.

## 2021-06-26 NOTE — Telephone Encounter (Signed)
Spoke with the patient. Her concern is about a findings on the MRI from January. Oncology ordered the MRI. The patient wants to know if it is significant, can it be causing her lack of appetite. I have scheduled her for a follow up appointment first available end of March.  She has had some medications changes. The Remeron was stopped. See the office note from her visit 06/23/21 for details.

## 2021-06-26 NOTE — Telephone Encounter (Signed)
Inbound call from patient would like a call back to discuss the current problems she is experiencing.

## 2021-06-27 ENCOUNTER — Other Ambulatory Visit: Payer: Self-pay

## 2021-06-27 ENCOUNTER — Encounter (HOSPITAL_BASED_OUTPATIENT_CLINIC_OR_DEPARTMENT_OTHER): Payer: Medicare Other | Admitting: Physical Medicine and Rehabilitation

## 2021-06-27 ENCOUNTER — Ambulatory Visit: Payer: Medicare Other

## 2021-06-27 DIAGNOSIS — R630 Anorexia: Secondary | ICD-10-CM

## 2021-06-27 DIAGNOSIS — R1084 Generalized abdominal pain: Secondary | ICD-10-CM

## 2021-06-27 MED ORDER — AMITRIPTYLINE HCL 10 MG PO TABS: 10.0000 mg | ORAL_TABLET | Freq: Every day | ORAL | 1 refills | Status: DC

## 2021-06-27 NOTE — Telephone Encounter (Signed)
Patient is notified. She would like to discuss putting in a G-tube as well.
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
## 2021-07-01 ENCOUNTER — Other Ambulatory Visit: Payer: Self-pay

## 2021-07-01 ENCOUNTER — Encounter (HOSPITAL_BASED_OUTPATIENT_CLINIC_OR_DEPARTMENT_OTHER): Payer: Medicare Other | Admitting: Registered Nurse

## 2021-07-01 ENCOUNTER — Encounter: Payer: Self-pay | Admitting: Registered Nurse

## 2021-07-01 VITALS — BP 127/77 | HR 75 | Temp 98.3°F | Ht 63.0 in | Wt 98.0 lb

## 2021-07-01 DIAGNOSIS — G894 Chronic pain syndrome: Secondary | ICD-10-CM

## 2021-07-01 DIAGNOSIS — Z79891 Long term (current) use of opiate analgesic: Secondary | ICD-10-CM | POA: Diagnosis present

## 2021-07-01 DIAGNOSIS — Z5181 Encounter for therapeutic drug level monitoring: Secondary | ICD-10-CM

## 2021-07-01 DIAGNOSIS — R630 Anorexia: Secondary | ICD-10-CM | POA: Diagnosis present

## 2021-07-01 DIAGNOSIS — F5101 Primary insomnia: Secondary | ICD-10-CM

## 2021-07-01 DIAGNOSIS — R1084 Generalized abdominal pain: Secondary | ICD-10-CM | POA: Diagnosis present

## 2021-07-01 MED ORDER — OXYCODONE-ACETAMINOPHEN 7.5-325 MG PO TABS: 1.0000 | ORAL_TABLET | Freq: Every day | ORAL | 0 refills | Status: DC | PRN
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
## 2021-07-04 ENCOUNTER — Inpatient Hospital Stay: Payer: Medicare Other | Attending: Hematology

## 2021-07-04 ENCOUNTER — Other Ambulatory Visit: Payer: Self-pay

## 2021-07-04 ENCOUNTER — Other Ambulatory Visit: Payer: Self-pay | Admitting: Family Medicine

## 2021-07-04 DIAGNOSIS — Z79899 Other long term (current) drug therapy: Secondary | ICD-10-CM | POA: Diagnosis not present

## 2021-07-04 DIAGNOSIS — Z9884 Bariatric surgery status: Secondary | ICD-10-CM | POA: Insufficient documentation

## 2021-07-04 DIAGNOSIS — R296 Repeated falls: Secondary | ICD-10-CM | POA: Diagnosis not present

## 2021-07-04 DIAGNOSIS — D519 Vitamin B12 deficiency anemia, unspecified: Secondary | ICD-10-CM

## 2021-07-04 DIAGNOSIS — C7A8 Other malignant neuroendocrine tumors: Secondary | ICD-10-CM | POA: Diagnosis present

## 2021-07-04 DIAGNOSIS — N281 Cyst of kidney, acquired: Secondary | ICD-10-CM | POA: Diagnosis not present

## 2021-07-04 DIAGNOSIS — K219 Gastro-esophageal reflux disease without esophagitis: Secondary | ICD-10-CM | POA: Insufficient documentation

## 2021-07-04 DIAGNOSIS — E611 Iron deficiency: Secondary | ICD-10-CM | POA: Diagnosis not present

## 2021-07-04 DIAGNOSIS — E538 Deficiency of other specified B group vitamins: Secondary | ICD-10-CM | POA: Insufficient documentation

## 2021-07-04 DIAGNOSIS — F17210 Nicotine dependence, cigarettes, uncomplicated: Secondary | ICD-10-CM | POA: Insufficient documentation

## 2021-07-04 DIAGNOSIS — I700 Atherosclerosis of aorta: Secondary | ICD-10-CM | POA: Insufficient documentation

## 2021-07-04 DIAGNOSIS — D649 Anemia, unspecified: Secondary | ICD-10-CM | POA: Diagnosis not present

## 2021-07-04 DIAGNOSIS — Z1231 Encounter for screening mammogram for malignant neoplasm of breast: Secondary | ICD-10-CM

## 2021-07-04 DIAGNOSIS — R42 Dizziness and giddiness: Secondary | ICD-10-CM | POA: Diagnosis not present

## 2021-07-04 MED ORDER — CYANOCOBALAMIN 1000 MCG/ML IJ SOLN
1000.0000 ug | Freq: Once | INTRAMUSCULAR | Status: AC
  Administered 2021-07-04: 1000 ug via INTRAMUSCULAR
  Filled 2021-07-04: qty 1

## 2021-07-04 NOTE — Patient Instructions (Signed)
Vitamin B12 Deficiency ?Vitamin B12 deficiency occurs when the body does not have enough vitamin B12, which is an important vitamin. The body needs this vitamin: ?To make red blood cells. ?To make DNA. This is the genetic material inside cells. ?To help the nerves work properly so they can carry messages from the brain to the body. ?Vitamin B12 deficiency can cause various health problems, such as a low red blood cell count (anemia) or nerve damage. ?What are the causes? ?This condition may be caused by: ?Not eating enough foods that contain vitamin B12. ?Not having enough stomach acid and digestive fluids to properly absorb vitamin B12 from the food that you eat. ?Certain digestive system diseases that make it hard to absorb vitamin B12. These diseases include Crohn's disease, chronic pancreatitis, and cystic fibrosis. ?A condition in which the body does not make enough of a protein (intrinsic factor), resulting in too few red blood cells (pernicious anemia). ?Having a surgery in which part of the stomach or small intestine is removed. ?Taking certain medicines that make it hard for the body to absorb vitamin B12. These medicines include: ?Heartburn medicines (antacids and proton pump inhibitors). ?Certain antibiotic medicines. ?Some medicines that are used to treat diabetes, tuberculosis, gout, or high cholesterol. ?What increases the risk? ?The following factors may make you more likely to develop a B12 deficiency: ?Being older than age 50. ?Eating a vegetarian or vegan diet, especially while you are pregnant. ?Eating a poor diet while you are pregnant. ?Taking certain medicines. ?Having alcoholism. ?What are the signs or symptoms? ?In some cases, there are no symptoms of this condition. If the condition leads to anemia or nerve damage, various symptoms can occur, such as: ?Weakness. ?Fatigue. ?Loss of appetite. ?Weight loss. ?Numbness or tingling in your hands and feet. ?Redness and burning of the  tongue. ?Confusion or memory problems. ?Depression. ?Sensory problems, such as color blindness, ringing in the ears, or loss of taste. ?Diarrhea or constipation. ?Trouble walking. ?If anemia is severe, symptoms can include: ?Shortness of breath. ?Dizziness. ?Rapid heart rate (tachycardia). ?How is this diagnosed? ?This condition may be diagnosed with a blood test to measure the level of vitamin B12 in your blood. You may also have other tests, including: ?A group of tests that measure certain characteristics of blood cells (complete blood count, CBC). ?A blood test to measure intrinsic factor. ?A procedure where a thin tube with a camera on the end is used to look into your stomach or intestines (endoscopy). ?Other tests may be needed to discover the cause of B12 deficiency. ?How is this treated? ?Treatment for this condition depends on the cause. This condition may be treated by: ?Changing your eating and drinking habits, such as: ?Eating more foods that contain vitamin B12. ?Drinking less alcohol or no alcohol. ?Getting vitamin B12 injections. ?Taking vitamin B12 supplements. Your health care provider will tell you which dosage is best for you. ?Follow these instructions at home: ?Eating and drinking ? ?Eat lots of healthy foods that contain vitamin B12, including: ?Meats and poultry. This includes beef, pork, chicken, turkey, and organ meats, such as liver. ?Seafood. This includes clams, rainbow trout, salmon, tuna, and haddock. ?Eggs. ?Cereal and dairy products that are fortified. This means that vitamin B12 has been added to the food. Check the label on the package to see if the food is fortified. ?The items listed above may not be a complete list of recommended foods and beverages. Contact a dietitian for more information. ?General instructions ?Get any   injections that are prescribed by your health care provider. ?Take supplements only as told by your health care provider. Follow the directions carefully. ?Do  not drink alcohol if your health care provider tells you not to. In some cases, you may only be asked to limit alcohol use. ?Keep all follow-up visits as told by your health care provider. This is important. ?Contact a health care provider if: ?Your symptoms come back. ?Get help right away if you: ?Develop shortness of breath. ?Have a rapid heart rate. ?Have chest pain. ?Become dizzy or lose consciousness. ?Summary ?Vitamin B12 deficiency occurs when the body does not have enough vitamin B12. ?The main causes of vitamin B12 deficiency include dietary deficiency, digestive diseases, pernicious anemia, and having a surgery in which part of the stomach or small intestine is removed. ?In some cases, there are no symptoms of this condition. If the condition leads to anemia or nerve damage, various symptoms can occur, such as weakness, shortness of breath, and numbness. ?Treatment may include getting vitamin B12 injections or taking vitamin B12 supplements. Eat lots of healthy foods that contain vitamin B12. ?This information is not intended to replace advice given to you by your health care provider. Make sure you discuss any questions you have with your health care provider. ?Document Revised: 10/16/2020 Document Reviewed: 12/28/2017 ?Elsevier Patient Education ? 2022 [employer] Inc[employer] ? ?

## 2021-07-07 ENCOUNTER — Encounter
Payer: Medicare Other | Attending: Physical Medicine and Rehabilitation | Admitting: Physical Medicine and Rehabilitation

## 2021-07-07 ENCOUNTER — Other Ambulatory Visit: Payer: Self-pay

## 2021-07-07 DIAGNOSIS — R112 Nausea with vomiting, unspecified: Secondary | ICD-10-CM | POA: Insufficient documentation

## 2021-07-07 DIAGNOSIS — R42 Dizziness and giddiness: Secondary | ICD-10-CM | POA: Insufficient documentation

## 2021-07-07 DIAGNOSIS — G894 Chronic pain syndrome: Secondary | ICD-10-CM | POA: Insufficient documentation

## 2021-07-07 DIAGNOSIS — K5903 Drug induced constipation: Secondary | ICD-10-CM | POA: Insufficient documentation

## 2021-07-07 LAB — DRUG TOX MONITOR 1 W/CONF, ORAL FLD
Amphetamines: NEGATIVE ng/mL (ref <10)
Barbiturates: NEGATIVE ng/mL (ref <10)
Benzodiazepines: NEGATIVE ng/mL (ref <0.50)
Buprenorphine: NEGATIVE ng/mL (ref <0.10)
Cocaine: NEGATIVE ng/mL (ref <5.0)
Codeine: NEGATIVE ng/mL (ref <2.5)
Cotinine: 14.8 ng/mL — ABNORMAL HIGH (ref <5.0)
Dihydrocodeine: NEGATIVE ng/mL (ref <2.5)
Fentanyl: NEGATIVE ng/mL (ref <0.10)
Heroin Metabolite: NEGATIVE ng/mL (ref <1.0)
Hydrocodone: NEGATIVE ng/mL (ref <2.5)
Hydromorphone: NEGATIVE ng/mL (ref <2.5)
MARIJUANA: NEGATIVE ng/mL (ref <2.5)
MDMA: NEGATIVE ng/mL (ref <10)
Meprobamate: NEGATIVE ng/mL (ref <2.5)
Methadone: NEGATIVE ng/mL (ref <5.0)
Morphine: NEGATIVE ng/mL (ref <2.5)
Nicotine Metabolite: POSITIVE ng/mL — AB (ref <5.0)
Norhydrocodone: NEGATIVE ng/mL (ref <2.5)
Noroxycodone: 3.3 ng/mL — ABNORMAL HIGH (ref <2.5)
Opiates: POSITIVE ng/mL — AB (ref <2.5)
Oxycodone: 32.6 ng/mL — ABNORMAL HIGH (ref <2.5)
Oxymorphone: NEGATIVE ng/mL (ref <2.5)
Phencyclidine: NEGATIVE ng/mL (ref <10)
Tapentadol: NEGATIVE ng/mL (ref <5.0)
Tramadol: NEGATIVE ng/mL (ref <5.0)
Zolpidem: NEGATIVE ng/mL (ref <5.0)

## 2021-07-07 LAB — DRUG TOX ALC METAB W/CON, ORAL FLD

## 2021-07-07 MED ORDER — MECLIZINE HCL 12.5 MG PO TABS
12.5000 mg | ORAL_TABLET | Freq: Three times a day (TID) | ORAL | 0 refills | Status: DC | PRN
Start: 2021-07-07 — End: 2021-10-14
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
## 2021-07-09 ENCOUNTER — Telehealth: Payer: Self-pay | Admitting: *Deleted

## 2021-07-09 NOTE — Telephone Encounter (Signed)
Oral swab drug screen was consistent for prescribed medications.  ?

## 2021-07-10 ENCOUNTER — Telehealth: Payer: Self-pay

## 2021-07-10 ENCOUNTER — Encounter (HOSPITAL_BASED_OUTPATIENT_CLINIC_OR_DEPARTMENT_OTHER): Payer: Medicare Other | Admitting: Physical Medicine and Rehabilitation

## 2021-07-10 ENCOUNTER — Other Ambulatory Visit: Payer: Self-pay

## 2021-07-10 DIAGNOSIS — G894 Chronic pain syndrome: Secondary | ICD-10-CM

## 2021-07-10 NOTE — Telephone Encounter (Signed)
Patient called and stated she spoke with Dr. Ranell [name] and she was advised to call the clinic and tell Emily Sensing, NP to prescribe her Oxycodone for twice a day so she is able to get it from the pharmacy. Please advise ?

## 2021-07-10 NOTE — Telephone Encounter (Addendum)
PMP was Reviewed.  ?Dr [name] changed her oxycodone to twice a day as needed for pain. According to Dr [name] note it will begin next office visit. Emily [name] sated it should begin this month. Message sent to Dr [name] for clarification. Emily [name] is aware of the above and verbalizes understanding.   ? ?

## 2021-07-11 ENCOUNTER — Telehealth: Payer: Self-pay | Admitting: Registered Nurse

## 2021-07-11 ENCOUNTER — Other Ambulatory Visit: Payer: Medicare Other

## 2021-07-11 ENCOUNTER — Ambulatory Visit: Payer: Medicare Other | Admitting: Hematology

## 2021-07-11 MED ORDER — OXYCODONE-ACETAMINOPHEN 7.5-325 MG PO TABS: 1.0000 | ORAL_TABLET | Freq: Two times a day (BID) | ORAL | 0 refills | Status: DC | PRN

## 2021-07-11 NOTE — Telephone Encounter (Signed)
Dr [name] changed her Oxycodone to twice a day according to her note for April visit Emily [name] called office yesterday ( 07/10/2021) regarding the above, stating Dr [name] sated she could take her Oxycodone twice a day. This provider was in contact with Dr [name], Dr. [name] stated we can change her oxycodone to twice a day, prescription e-scribed today, previous prescription was canceled.  ?Placed a call to Emily [name], no answer, unable to leave a message. Voicemail full  ?
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
## 2021-07-11 NOTE — Telephone Encounter (Signed)
Patient states she received call, but no message was left, advised the NP was attempting to contact and VM box was full, read to patient message from encounter in system.  ?

## 2021-07-18 ENCOUNTER — Other Ambulatory Visit: Payer: Self-pay

## 2021-07-18 ENCOUNTER — Inpatient Hospital Stay: Payer: Medicare Other

## 2021-07-18 ENCOUNTER — Inpatient Hospital Stay (HOSPITAL_BASED_OUTPATIENT_CLINIC_OR_DEPARTMENT_OTHER): Payer: Medicare Other | Admitting: Hematology

## 2021-07-18 VITALS — BP 128/81 | HR 75 | Temp 98.6°F | Resp 16 | Wt 95.5 lb

## 2021-07-18 DIAGNOSIS — D508 Other iron deficiency anemias: Secondary | ICD-10-CM

## 2021-07-18 DIAGNOSIS — C7A8 Other malignant neuroendocrine tumors: Secondary | ICD-10-CM

## 2021-07-18 LAB — CBC WITH DIFFERENTIAL/PLATELET
Abs Immature Granulocytes: 0.02 10*3/uL (ref 0.00–0.07)
Basophils Absolute: 0 10*3/uL (ref 0.0–0.1)
Basophils Relative: 0 %
Eosinophils Absolute: 0.1 10*3/uL (ref 0.0–0.5)
Eosinophils Relative: 1 %
HCT: 31.3 % — ABNORMAL LOW (ref 36.0–46.0)
Hemoglobin: 10 g/dL — ABNORMAL LOW (ref 12.0–15.0)
Immature Granulocytes: 0 %
Lymphocytes Relative: 32 %
Lymphs Abs: 2.3 10*3/uL (ref 0.7–4.0)
MCH: 24.3 pg — ABNORMAL LOW (ref 26.0–34.0)
MCHC: 31.9 g/dL (ref 30.0–36.0)
MCV: 76.2 fL — ABNORMAL LOW (ref 80.0–100.0)
Monocytes Absolute: 0.4 10*3/uL (ref 0.1–1.0)
Monocytes Relative: 6 %
Neutro Abs: 4.2 10*3/uL (ref 1.7–7.7)
Neutrophils Relative %: 61 %
Platelets: 201 10*3/uL (ref 150–400)
RBC: 4.11 MIL/uL (ref 3.87–5.11)
RDW: 15.8 % — ABNORMAL HIGH (ref 11.5–15.5)
WBC: 7 10*3/uL (ref 4.0–10.5)
nRBC: 0 % (ref 0.0–0.2)

## 2021-07-18 LAB — FERRITIN: Ferritin: 310 ng/mL — ABNORMAL HIGH (ref 11–307)

## 2021-07-18 NOTE — Progress Notes (Signed)
?[place]   ?[phone] [phone]   ?Clinic Follow up Note  ? ?Patient Care Team: ?Buzzy Han, MD as PCP - General (Family Medicine) ?Minus Breeding, MD as PCP - Cardiology (Cardiology) ?Donnie Mesa, MD as Consulting Physician (General Surgery) ?Monna Fam, MD as Consulting Physician (Ophthalmology) ? ?Date of Service:  07/18/2021 ? ?CHIEF COMPLAINT: f/u of neuroendocrine tumor of appendix, anemia ? ?CURRENT THERAPY:  ?Surveillance ?-B12 injection, currently every 2 weeks ?-IV Venofer '300mg'$  as needed ? ?ASSESSMENT & PLAN:  ?Emily [name] is a 81 y.o. female with  ? ?1. 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 She is s/p appendectomy; this was felt to be sufficient given her age, comorbidities, and prior abdominal surgeries. She is currently on surveillance. ?-Her CT AP from 05/12/21 (note this was done with oral contrast only due to difficulty with IV) shows fullness of pancreatic head, no other findings.  ?-Abdomen MRI on 05/26/21 showed no pancreatic mass or other concerns.  ?-she reports worsening abdominal pain that occurs around site of prior feeding tube and travels around her low and mid abdomen. Given this, I recommend obtaining Dotatate PET to rule out any recurrent disease.  ?  ?2. Symptom Management: Abdominal pain, low food intake, weight loss  ?-She has intermittent abdominal pain. Likely related to her multiple abdominal surgery, scarring, and hiatal hernia. This continues to be bothersome to this day, with tenderness to palpation on prior physical exam. ?-she reports she has been losing weight lately. (Recall her baseline weight was in the 130's, and she has been slowly losing weight since around 03/2019.) She has been able to gain some weight back lately, and is at 100 lbs today. ?-she is connected with our dietician  ?  ?3. Anemia of B12 deficiency, and iron deficiency, secondary  to gastric surgery   ?-Started 03/02/18 with hemoglobin 10.8, no anemia prior to. Her Iron study was WNL but on low end ?-she started monthly B12 1021mg injections at home in 05/2018. She increased injections to every 2 weeks in 08/2018 given low response.  ?-Despite frequent B12 injection and normal B12 level, she still has moderate microcytic anemia. She has tried oral iron, but could not tolerate due to GI side effects (diarrhea). She received IV Venofer '300mg'$  on 11/22/20.  ?-she is receiving B12 injections here every 2 weeks. ?  ?4. Smoking Cessation  ?-She has not completely quit smoking, but continues to try.  ?  ?5. Vertigo, Recurrent Falls, LE Tremors  ?-She notes since 2020 she has had spells of falling out. She also notes tremors of her legs when she stands since early 2021.  ?-Her Brain MRI from 05/09/19 showed mild chronic small vessel ischemic disease. She was told this is causing degeneration of her brain which will effect her balance and memory eventually. ?-Work up with Neurologist Dr WJannifer Franklinhas been negative. [facility] ?-She knows to ambulate with walker and has someone living with her now.  ?  ?  ?PLAN: ?-B12 injection in 1 and 3 weeks ?-f/u in 3 weeks with cu-64 PET a few days before ? ? ?No problem-specific Assessment & Plan notes found for this encounter. ? ? ?SUMMARY OF ONCOLOGIC HISTORY: ?Oncology History Overview Note  ?Cancer Staging ?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 ?Staging form: Appendix - Neuroendocrine Tumors, AJCC 8th Edition ?- Pathologic stage from 08/21/2016: Stage Unknown (pT2, pNX, cM0) -  Signed by Truitt Merle, MD on 09/18/2016 ? ?  ?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[facility])  ?08/21/2016 Pathology Results  ? Appendix, Other than Incidental ?- LOW GRADE NEUROENDOCRINE TUMOR WITH ABUNDANT GOBLET CELLS, 3.5 CM. ?- TUMOR EXTENDS INTO SUBSEROSAL CONNECTIVE TISSUE. ?- PROXIMAL MARGIN FREE OF TUMOR. ?   ?08/21/2016 Imaging  ? CT abdomen and pelvis w contrast: ?Appendix, Other than Incidental ?- LOW GRADE NEUROENDOCRINE TUMOR WITH ABUNDANT GOBLET CELLS, 3.5 CM. ?- TUMOR EXTENDS INTO SUBSEROSAL CONNECTIVE TISSUE. ?- PROXIMAL MARGIN FREE OF TUMOR. ?  ?08/21/2016 Surgery  ? lapscopic appendectomy by Dr. Georgette Dover  ?  ?08/21/2016 Initial Diagnosis  ? 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[facility]) ?  ?07/06/2017 Imaging  ? CT AP W Contrast  ?IMPRESSION: ?1. There are no specific findings identified to suggest metastatic ?disease within the abdomen or pelvis. ?2. Small, nonspecific area of soft tissue at the cecal base adjacent ?to appendectomy suture line. Findings may represent postsurgical ?change versus residual tumor. Attention this area on follow-up ?imaging is advised. ?3. Small hiatal hernia, patulous and dilated distal esophagus. ?4. Chronic increase caliber of the common bile duct and pancreatic ?duct at the level of the pancreatic head. No stone or mass ?visualized. ?5. Nonobstructing right renal calculi ?6.  Aortic Atherosclerosis (ICD10-I70.0). ?  ?08/31/2018 Imaging  ? CT AP 08/31/18  ?IMPRESSION: ?1. Status post appendectomy without evidence of recurrent soft ?tissue or lymphadenopathy. Previously described soft tissue at the ?cecal base is not appreciated on today's examination. No evidence of ?metastatic disease in the abdomen or pelvis. ?  ?2. Chronic, incidental, and postoperative findings as detailed ?above. ?  ?07/15/2020 Imaging  ? CT AP  ?IMPRESSION: ?1. Stable exam. No new or progressive interval findings to suggest ?recurrent or metastatic disease. ?2. Large colonic stool volume. Imaging features would be compatible ?with constipation in the appropriate clinical setting. ?3. Similar appearance of intra and extrahepatic biliary duct ?dilatation status post cholecystectomy. Findings likely reflect ?sequelae of prior surgery. Correlation with liver function tests may ?prove helpful. ?4. Right renal  cysts. ?5. Aortic Atherosclerosis (ICD10-I70.0). ?  ?05/12/2021 Imaging  ? EXAM: ?CT CHEST, ABDOMEN AND PELVIS WITHOUT CONTRAST ? ?IMPRESSION: ?1. Fullness in the region of the pancreatic head and proximal body, ?poorly evaluated on today's noncontrast CT examination. The ?possibility of pancreatic neoplasm should be considered given the ?patient's history of unexplained weight loss. This could be better ?evaluated with follow-up abdominal MRI with and without IV ?gadolinium with MRCP if there is clinical concern for neoplasm. ?2. No other findings in the chest, abdomen or pelvis to account for ?the patient's unexplained weight loss. ?3. Subtle changes in the lungs concerning for developing ?interstitial lung disease. Outpatient referral to Pulmonology for ?further clinical evaluation is recommended. Follow-up nonemergent ?high-resolution chest CT could also be considered to better evaluate these findings, preferably in 3-6 months to allow for temporal changes in the appearance of the lung parenchyma. ?4. Pulmonary nodules are stable compared to remote prior ?examinations, considered definitively benign, as above. ?5. 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 ?6. Additional incidental findings, as above. ?  ?05/26/2021 Imaging  ? EXAM: ?MRI ABDOMEN WITHOUT AND WITH CONTRAST ? ?IMPRESSION: ?1. No pancreatic mass visualized. Segment of dilated pancreatic duct at the head of the pancreas measuring up to 7 mm in diameter with normal caliber of the duct throughout the body and tail. ?2. Mild biliary ductal dilatation which may be compensatory from ?cholecystectomy, or possibly secondary to ampullary stenosis, ?correlate  with bilirubin. ?3. Distended and fluid-filled distal esophagus. Correlate for ?possible reflux. ?4. Atelectasis or infiltrates in the lung bases ?left-greater-than-right. ?5. Renal cysts. ?  ? ? ? ?INTERVAL HISTORY:  ?Emily [name] is here for a follow up of NET and  anemia. She was last seen by me on 05/29/21. She presents to the clinic alone. ?She reports she has felt sick the last few weeks. She reports pain to the area where her feeding tube was that travels around her abdo

## 2021-07-19 ENCOUNTER — Encounter: Payer: Self-pay | Admitting: Hematology

## 2021-07-21 ENCOUNTER — Other Ambulatory Visit: Payer: Medicare Other | Admitting: Hospice

## 2021-07-21 ENCOUNTER — Other Ambulatory Visit: Payer: Self-pay

## 2021-07-21 DIAGNOSIS — R269 Unspecified abnormalities of gait and mobility: Secondary | ICD-10-CM

## 2021-07-21 DIAGNOSIS — G4700 Insomnia, unspecified: Secondary | ICD-10-CM

## 2021-07-21 DIAGNOSIS — D519 Vitamin B12 deficiency anemia, unspecified: Secondary | ICD-10-CM

## 2021-07-21 DIAGNOSIS — G8929 Other chronic pain: Secondary | ICD-10-CM

## 2021-07-21 DIAGNOSIS — C7A8 Other malignant neuroendocrine tumors: Secondary | ICD-10-CM

## 2021-07-21 DIAGNOSIS — Z515 Encounter for palliative care: Secondary | ICD-10-CM

## 2021-07-21 DIAGNOSIS — E43 Unspecified severe protein-calorie malnutrition: Secondary | ICD-10-CM

## 2021-07-21 NOTE — Progress Notes (Signed)
? ? ?Manufacturing engineer ?Community Palliative Care Consult Note ?Telephone: [phone]  ?Fax: [phone] ? ?PATIENT NAME: Emily [name] ?DOB: [birth year] ?MRN: [id number] ? ?PRIMARY CARE PROVIDER:  Dr Reece Levy ? ?REFERRING PROVIDER: Buzzy Han, MD ?Buzzy Han, MD ?[place] ?[place],  [place] [address] ? ?RESPONSIBLE PARTY:  Self [phone] best number to call ?Emergency contact: [phone] daughter [name] ?[facility]- caregiver- [place] Fri ?Contact Information   ? ? Name Relation Home Work Mobile  ? Zarriah, Starkel Daughter [phone]  [phone]  ? [name] Sister [phone]    ? [name],[name] Redmond Pulling [phone]    ? ?  ?TELEHEALTH VISIT STATEMENT ?Due to the COVID-19 crisis, this visit was done via telemedicine and it was initiated and consent by this patient and or family. Video-audio (telehealth) contact was unable to be done due to technical barriers from the patient?s side.  ?I connected with patient OR PROXY by a telephone  and verified that I am speaking with the correct person. I discussed the limitations of evaluation and management by telemedicine. The patient expressed understanding and agreed to proceed.  ?Visit is to build trust and highlight Palliative Medicine as specialized medical care for people living with serious illness, aimed at facilitating better quality of life through symptoms relief, assisting with advance care planning and complex medical decision making. This is a follow up visit. ? ?RECOMMENDATIONS/PLAN:  ? ?Advance Care Planning/Code Status: Patient is a Full code ? ?Goals of Care: Goals of care include to maximize quality of life and symptom management. ? ?Visit consisted of counseling and education dealing with the complex and emotionally intense issues of symptom management and palliative care in the setting of serious and potentially life-threatening illness. Palliative care team will continue to support  patient, patient's family, and medical team. ? 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 Followed by Oncology for surveillance; next f/u appointment 08/11/21 ?Protein caloric malnutrition with abnormal weight loss: Current weight 95 Ib down from 130 Ib a year ago.   Current Albumin 3.2 05/12/21 down from 3.6 03/10/21. Encouraged daily Ensure or Glucerna for nutritional supplementation.Continue small frequent meals 4-6 times a day, and Mirtazapine as ordered to help boost appetite.  ?Insomnia: Sleep hygiene discussed. Encouraged to take Mirtazapine 15 mg currently ordered at  bed time. Recommendation: Initiate 5 mg Melatonin at bedtime if condition persists.  ?Abdominal pain:  Followed by pain clinic, f/u 07/29/21. chronic post surgical abdominal pain. Patient has gabapentin 400 mg 3 times daily, Lidocaine patch, Tizanidine - GI cocktail from GI. Patient has oxycodone on hand. GI consult as needed ?Unsteady gait: Encouraged use of rolling walker. Falls precautions discussed.  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   ?Routine CBC B12, CMP. ?IBS: Continue Linzess and follow-up with GI as planned. ?Follow up: Palliative care will continue to follow for complex medical decision making, advance care planning, and clarification of goals. Return 4 weeks or prn. Encouraged to call provider sooner with any concerns. ?  ?CHIEF COMPLAINT: Palliative follow up visit/abdominal pain ?  ?HISTORY OF PRESENT ILLNESS:  Emily [name] a 81 y.o. 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 She denies pain or discomfort, no  nausea/vomiting, appetite is fair to good.  History obtained from review of EMR, discussion with primary team, family and/or patient. Records reviewed and summarized above. All 10 point systems reviewed and are negative except as documented in history of present illness above ? ?Review and summarization of Epic records shows history from other than patient.  ? ?Palliative Care was asked to follow this patient o help address complex decision making in the context of advance care planning and goals of care clarification.  ? ?PERTINENT MEDICATIONS:  ?Outpatient Encounter Medications as of 07/21/2021  ?Medication Sig  ? acetaminophen (TYLENOL) 500 MG tablet Take 500 mg by mouth every 8 (eight) hours as needed (for pain.).  ? Alum & Mag Hydroxide-Simeth (GI COCKTAIL) SUSP suspension 90 ML 2% Viscous lidocain  Take 10 ML's every 8 hours as needed  ?90ML Dicyclomine '10mg'$ /35m ?270 ML Maalox '400mg'$   ? AMBULATORY NON FORMULARY MEDICATION [facility]: [facility]   For s/p multiple abdominal surgerys chronic abdominal and back pain  ? amitriptyline (ELAVIL) 10 MG tablet Take 1 tablet (10 mg total) by mouth at bedtime.  ? ammonium lactate (AMLACTIN) 12 % cream APPLY EXTERNALLY TO THE AFFECTED AREA AS NEEDED FOR DRY SKIN  ? clotrimazole-betamethasone (LOTRISONE) cream Apply 1 application topically 2 (two) times daily as needed (rash).   ? cyanocobalamin (,VITAMIN B-12,) 1000 MCG/ML injection Inject 1 mL (1,000 mcg total) into the muscle every 14 (fourteen) days.  ? dicyclomine (BENTYL) 10 MG capsule TAKE 1 CAPSULE BY MOUTH EVERY 8 HOURS AS NEEDED FOR SPASMS  ? diphenhydrAMINE (BENADRYL) 12.5 MG chewable tablet Chew 1 tablet (12.5 mg total) by mouth 4 (four) times daily as needed for allergies.  ? esomeprazole (NEXIUM) 20 MG capsule Take 1 capsule (20 mg total) by mouth daily as needed (GERD).  ? lidocaine (LIDODERM) 5 % USE 1 PATCH EXTERNALLY ONE DAILY REMOVE AND DISCARD WITHIN 12 HOURS.  ? LINZESS 145 MCG CAPS capsule  TAKE 1 CAPSULE(145 MCG) BY MOUTH DAILY BEFORE BREAKFAST  ? loperamide (IMODIUM) 2 MG capsule Take 1 capsule (2 mg total) by mouth 4 (four) times daily as needed for diarrhea or loose stools.  ? losartan (COZAAR) 25 MG tablet Take 25 mg by mouth daily.  ? meclizine (ANTIVERT) 12.5 MG tablet Take 1 tablet (12.5 mg total) by mouth 3 (three) times daily as needed for dizziness.  ? megestrol (MEGACE) 40 MG/ML suspension SHAKE LIQUID AND TAKE 10 ML(400 MG) BY MOUTH DAILY  ? Melatonin 3 MG CAPS Take 1 capsule (3 mg total) by mouth at bedtime.  ? meloxicam (MOBIC) 7.5 MG tablet Take 7.5 mg by mouth daily.  ? metaxalone (SKELAXIN) 800 MG tablet Take 0.5 tablets (400 mg total) by mouth 3 (three) times daily as needed for muscle spasms.  ? mirtazapine (REMERON) 15 MG tablet Take 15 mg by mouth at bedtime.  ? mirtazapine (REMERON) 30 MG tablet Take 30 mg by mouth at bedtime.  ? oxybutynin (DITROPAN XL) 5 MG 24 hr tablet Take 1 tablet (5 mg total) by mouth at bedtime.  ? oxyCODONE-acetaminophen (PERCOCET) 7.5-325 MG tablet Take 1 tablet by mouth 2 (two) times daily as needed for moderate pain. Please Fill Today: Sig was changed on 07/07/2021  ? promethazine (PHENERGAN) 12.5 MG tablet Take 1 tablet (12.5 mg total) by mouth every 4 (four) hours as needed for nausea or vomiting.  ? RESTASIS 0.05 % ophthalmic emulsion Place 1 drop into both eyes 2 (two) times daily.  ? ?No facility-administered encounter medications on file as of 07/21/2021.  ? ? ?  HOSPICE ELIGIBILITY/DIAGNOSIS: TBD ? ?PAST MEDICAL HISTORY:  ?Past Medical History:  ?Diagnosis Date  ? Alcohol abuse   ? Alcoholic ketoacidosis   ? Allergy   ? Anemia   ? Anxiety   ? Aortic atherosclerosis ([place])   ? Appendiceal tumor   ? Arthritis   ? legs and back  ? Barrett esophagus   ? Bilateral knee pain   ? Bloating   ? Blood in urine   ? small amount  ? Cataract   ? Clotting disorder ([place])   ? h/o dvt  ? Common bile duct dilation   ? Dehydration   ? Depressive disorder   ? Diabetes  mellitus   ? type 2-diet controlled   ? Diverticulosis   ? DVT (deep venous thrombosis) ([place])   ? left leg  ? Essential tremor   ? Feeding difficulty in adult   ? 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

## 2021-07-24 ENCOUNTER — Ambulatory Visit: Payer: Medicare Other | Admitting: Physical Medicine and Rehabilitation

## 2021-07-25 ENCOUNTER — Other Ambulatory Visit: Payer: Self-pay

## 2021-07-25 ENCOUNTER — Inpatient Hospital Stay: Payer: Medicare Other

## 2021-07-25 DIAGNOSIS — C7A8 Other malignant neuroendocrine tumors: Secondary | ICD-10-CM | POA: Diagnosis not present

## 2021-07-25 DIAGNOSIS — D519 Vitamin B12 deficiency anemia, unspecified: Secondary | ICD-10-CM

## 2021-07-25 MED ORDER — CYANOCOBALAMIN 1000 MCG/ML IJ SOLN
1000.0000 ug | Freq: Once | INTRAMUSCULAR | Status: AC
  Administered 2021-07-25: 1000 ug via INTRAMUSCULAR
  Filled 2021-07-25: qty 1

## 2021-07-29 ENCOUNTER — Other Ambulatory Visit: Payer: Self-pay

## 2021-07-29 ENCOUNTER — Encounter: Payer: Self-pay | Admitting: Physical Medicine and Rehabilitation

## 2021-07-29 ENCOUNTER — Encounter (HOSPITAL_BASED_OUTPATIENT_CLINIC_OR_DEPARTMENT_OTHER): Payer: Medicare Other | Admitting: Physical Medicine and Rehabilitation

## 2021-07-29 VITALS — BP 152/82 | HR 109 | Ht 63.0 in | Wt 89.6 lb

## 2021-07-29 DIAGNOSIS — K5903 Drug induced constipation: Secondary | ICD-10-CM | POA: Diagnosis present

## 2021-07-29 DIAGNOSIS — G894 Chronic pain syndrome: Secondary | ICD-10-CM | POA: Diagnosis present

## 2021-07-29 DIAGNOSIS — R42 Dizziness and giddiness: Secondary | ICD-10-CM | POA: Diagnosis present

## 2021-07-29 DIAGNOSIS — R112 Nausea with vomiting, unspecified: Secondary | ICD-10-CM | POA: Diagnosis present

## 2021-07-29 MED ORDER — MAGNESIUM CITRATE PO SOLN: 1.0000 | Freq: Once | ORAL | 3 refills | Status: AC
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
## 2021-07-30 ENCOUNTER — Encounter: Payer: Medicare Other | Admitting: Physical Medicine and Rehabilitation

## 2021-07-30 DIAGNOSIS — K5903 Drug induced constipation: Secondary | ICD-10-CM

## 2021-07-30 DIAGNOSIS — G894 Chronic pain syndrome: Secondary | ICD-10-CM

## 2021-07-31 ENCOUNTER — Other Ambulatory Visit: Payer: Self-pay | Admitting: Physical Medicine and Rehabilitation

## 2021-07-31 MED ORDER — MAGNESIUM CITRATE PO SOLN: 1.0000 | Freq: Once | ORAL | 3 refills | Status: AC

## 2021-07-31 NOTE — Progress Notes (Signed)
Patient asks what medication was sent for constipation and I discussed it was magnesium citrate ?

## 2021-08-01 ENCOUNTER — Ambulatory Visit: Payer: Medicare Other | Admitting: Gastroenterology

## 2021-08-08 ENCOUNTER — Ambulatory Visit: Payer: Medicare Other

## 2021-08-11 ENCOUNTER — Encounter (HOSPITAL_COMMUNITY)
Admission: RE | Admit: 2021-08-11 | Discharge: 2021-08-11 | Disposition: A | Discharge status: Home / Self Care | Payer: Medicare Other | Source: Ambulatory Visit | Attending: Hematology | Admitting: Hematology

## 2021-08-11 ENCOUNTER — Ambulatory Visit: Payer: Medicare Other

## 2021-08-11 ENCOUNTER — Inpatient Hospital Stay: Payer: Medicare Other

## 2021-08-11 ENCOUNTER — Inpatient Hospital Stay: Payer: Medicare Other | Admitting: Hematology

## 2021-08-11 ENCOUNTER — Telehealth: Payer: Self-pay

## 2021-08-11 DIAGNOSIS — C7A8 Other malignant neuroendocrine tumors: Secondary | ICD-10-CM | POA: Insufficient documentation

## 2021-08-11 MED ORDER — OXYCODONE-ACETAMINOPHEN 7.5-325 MG PO TABS: 1.0000 | ORAL_TABLET | Freq: Two times a day (BID) | ORAL | 0 refills | Status: DC | PRN

## 2021-08-11 MED ORDER — COPPER CU 64 DOTATATE 1 MCI/ML IV SOLN
4.0000 | Freq: Once | INTRAVENOUS | Status: AC
  Administered 2021-08-11: 4 via INTRAVENOUS

## 2021-08-11 NOTE — Addendum Note (Signed)
Addended by: Bayard Hugger on: 08/11/2021 01:49 PM ? ? Modules accepted: Orders ? ?

## 2021-08-11 NOTE — Telephone Encounter (Signed)
PMP was Reviewed:  ?Oxycodone E-scribed today.  ?Placed a call to Ms. Mago regarding the above, she verbalizes understanding.  ?

## 2021-08-11 NOTE — Telephone Encounter (Signed)
Patient is calling to request a refill for Oxycodone-Acetaminophen. Per PMP, last fill was 07/11/21. She stated she did not call last week because she thought she had a enough to last until today. She has been out since Saturday. I emphasized to her that she has to call at least 3 days before her prescription runs out. ?

## 2021-08-12 ENCOUNTER — Other Ambulatory Visit: Payer: Self-pay | Admitting: Physical Medicine and Rehabilitation

## 2021-08-12 MED ORDER — MAGNESIUM HYDROXIDE 400 MG/5ML PO SUSP
30.0000 mL | Freq: Every day | ORAL | 0 refills | Status: DC | PRN
Start: 2021-08-12 — End: 2021-08-14

## 2021-08-13 ENCOUNTER — Inpatient Hospital Stay: Payer: Medicare Other

## 2021-08-13 ENCOUNTER — Other Ambulatory Visit: Payer: Self-pay

## 2021-08-13 ENCOUNTER — Encounter: Payer: Self-pay | Admitting: Hematology

## 2021-08-13 ENCOUNTER — Inpatient Hospital Stay: Payer: Medicare Other | Attending: Hematology | Admitting: Hematology

## 2021-08-13 VITALS — BP 140/74 | HR 92 | Temp 98.8°F | Resp 18 | Ht 63.0 in | Wt 89.6 lb

## 2021-08-13 DIAGNOSIS — R42 Dizziness and giddiness: Secondary | ICD-10-CM | POA: Diagnosis not present

## 2021-08-13 DIAGNOSIS — E538 Deficiency of other specified B group vitamins: Secondary | ICD-10-CM | POA: Diagnosis present

## 2021-08-13 DIAGNOSIS — F17210 Nicotine dependence, cigarettes, uncomplicated: Secondary | ICD-10-CM | POA: Insufficient documentation

## 2021-08-13 DIAGNOSIS — R109 Unspecified abdominal pain: Secondary | ICD-10-CM | POA: Diagnosis not present

## 2021-08-13 DIAGNOSIS — D649 Anemia, unspecified: Secondary | ICD-10-CM | POA: Diagnosis not present

## 2021-08-13 DIAGNOSIS — R296 Repeated falls: Secondary | ICD-10-CM | POA: Diagnosis not present

## 2021-08-13 DIAGNOSIS — C7A8 Other malignant neuroendocrine tumors: Secondary | ICD-10-CM | POA: Insufficient documentation

## 2021-08-13 DIAGNOSIS — D519 Vitamin B12 deficiency anemia, unspecified: Secondary | ICD-10-CM

## 2021-08-13 DIAGNOSIS — Z9884 Bariatric surgery status: Secondary | ICD-10-CM | POA: Insufficient documentation

## 2021-08-13 MED ORDER — CYANOCOBALAMIN 1000 MCG/ML IJ SOLN
1000.0000 ug | Freq: Once | INTRAMUSCULAR | Status: AC
  Administered 2021-08-13: 1000 ug via INTRAMUSCULAR
  Filled 2021-08-13: qty 1

## 2021-08-13 NOTE — Progress Notes (Signed)
?[place]   ?Telephone:([phone]) [phone] [phone]   ?Clinic Follow up Note  ? ?Patient Care Team: ?Buzzy Han, MD as PCP - General (Family Medicine) ?Minus Breeding, MD as PCP - Cardiology (Cardiology) ?Donnie Mesa, MD as Consulting Physician (General Surgery) ?Monna Fam, MD as Consulting Physician (Ophthalmology) ? ?Date of Service:  08/13/2021 ? ?CHIEF COMPLAINT: f/u of neuroendocrine tumor of appendix, anemia ? ?CURRENT THERAPY:  ?Surveillance ?-B12 injection, currently every 2 weeks ?-IV Venofer '300mg'$  as needed ? ?ASSESSMENT & PLAN:  ?Emily [name] is a 81 y.o. female with  ? ?1. 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 She is s/p appendectomy; this was felt to be sufficient given her age, comorbidities, and prior abdominal surgeries. She is currently on surveillance. ?-Her CT AP from 05/12/21 (note this was done with oral contrast only due to difficulty with IV) shows fullness of pancreatic head, no other findings.  ?-Abdomen MRI on 05/26/21 showed no pancreatic mass or other concerns.  ?-CU-64 PET on 08/11/21 was negative. I reviewed the results with her today. ?-aside from abdominal pain (discussed in #2), she is clinically doing well from a NET standpoint.  ?  ?2. Abdominal pain, low food intake, weight loss  ?-She has intermittent abdominal pain. Likely related to her multiple abdominal surgery, scarring, and hiatal hernia. This continues to be very bothersome to this day, with tenderness to palpation on physical exams. We discussed other possible causes and tests. Due to her heavy smoking history, I think it's reasonable to rule out bowel ischemia.  However given the chronic nature of her abdominal pain, this seems to be less likely.   ?-I offered CTA of abdomen and pelvis.  Due to her difficult IV access, she is reluctant.  She is scheduled to see GI tomorrow and will ask for their opinion before  proceeding. ?-she reports she has been losing weight lately. (Recall her baseline weight was in the 130's, and she has been slowly losing weight since around 03/2019.) She does not tolerate Boost or Ensure. ?-she had complicated Nissen fundoplication and Belsey gastropexy and severe gastroparesis, required tube feeds for a while around 2010.  ?-she sees Dr. [name] for pain management  ?  ?3. Anemia of B12 deficiency, and iron deficiency, secondary to gastric surgery   ?-Started 03/02/18 with hemoglobin 10.8, no anemia prior to. Her Iron study was WNL but on low end ?-she started monthly B12 1086mg injections at home in 05/2018. She increased injections to every 2 weeks in 08/2018 given low response.  ?-Despite frequent B12 injection and normal B12 level, she still has moderate microcytic anemia. She has tried oral iron, but could not tolerate due to GI side effects (diarrhea). She received IV Venofer '300mg'$  on 11/22/20.  ?-she is receiving B12 injections here every 2 weeks. ?  ?4. Smoking Cessation, history of alcohol drinking  ?-She continues to smoke a half pack per day. I discussed cessation, as this could help her blood flow and appetite. She tells me she has quit before and can quit again. ?  ?5. Vertigo, Recurrent Falls, LE Tremors  ?-She notes since 2020 she has had spells of falling out. She also notes tremors of her legs when she stands since early 2021.  ?-Her Brain MRI from 05/09/19 showed mild chronic small vessel ischemic disease. She was told this is causing degeneration of her brain which will effect her balance and memory eventually. ?-Work up with Neurologist Dr WJannifer Franklin  has been negative. He referred her to a neurologist in [facility] for second opinion. ?-She knows to ambulate with walker and has someone living with her now.  ?  ?  ?PLAN: ?-B12 injection today and every 2 weeks ?-lab in 1 and 3 months ?-f/u in 4 months ? ? ?No problem-specific Assessment & Plan notes found for this encounter. ? ? ?SUMMARY OF  ONCOLOGIC HISTORY: ?Oncology History Overview Note  ?Cancer Staging ?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[place]) ?Staging form: Appendix - Neuroendocrine Tumors, AJCC 8th Edition ?- Pathologic stage from 08/21/2016: Stage Unknown (pT2, pNX, cM0) - Signed by Truitt Merle, MD on 09/18/2016 ? ?  ?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[facility])  ?08/21/2016 Pathology Results  ? Appendix, Other than Incidental ?- LOW GRADE NEUROENDOCRINE TUMOR WITH ABUNDANT GOBLET CELLS, 3.5 CM. ?- TUMOR EXTENDS INTO SUBSEROSAL CONNECTIVE TISSUE. ?- PROXIMAL MARGIN FREE OF TUMOR. ?  ?08/21/2016 Imaging  ? CT abdomen and pelvis w contrast: ?Appendix, Other than Incidental ?- LOW GRADE NEUROENDOCRINE TUMOR WITH ABUNDANT GOBLET CELLS, 3.5 CM. ?- TUMOR EXTENDS INTO SUBSEROSAL CONNECTIVE TISSUE. ?- PROXIMAL MARGIN FREE OF TUMOR. ?  ?08/21/2016 Surgery  ? lapscopic appendectomy by Dr. Georgette Dover  ?  ?08/21/2016 Initial Diagnosis  ? 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[facility]) ?  ?07/06/2017 Imaging  ? CT AP W Contrast  ?IMPRESSION: ?1. There are no specific findings identified to suggest metastatic ?disease within the abdomen or pelvis. ?2. Small, nonspecific area of soft tissue at the cecal base adjacent ?to appendectomy suture line. Findings may represent postsurgical ?change versus residual tumor. Attention this area on follow-up ?imaging is advised. ?3. Small hiatal hernia, patulous and dilated distal esophagus. ?4. Chronic increase caliber of the common bile duct and pancreatic ?duct at the level of the pancreatic head. No stone or mass ?visualized. ?5. Nonobstructing right renal calculi ?6.  Aortic Atherosclerosis (ICD10-I70.0). ?  ?08/31/2018 Imaging  ? CT AP 08/31/18  ?IMPRESSION: ?1. Status post appendectomy without evidence of recurrent soft ?tissue or lymphadenopathy. Previously described soft tissue at the ?cecal base is not appreciated on today's examination. No evidence of ?metastatic disease in the abdomen or  pelvis. ?  ?2. Chronic, incidental, and postoperative findings as detailed ?above. ?  ?07/15/2020 Imaging  ? CT AP  ?IMPRESSION: ?1. Stable exam. No new or progressive interval findings to suggest ?recurrent or metastatic disease. ?2. Large colonic stool volume. Imaging features would be compatible ?with constipation in the appropriate clinical setting. ?3. Similar appearance of intra and extrahepatic biliary duct ?dilatation status post cholecystectomy. Findings likely reflect ?sequelae of prior surgery. Correlation with liver function tests may ?prove helpful. ?4. Right renal cysts. ?5. Aortic Atherosclerosis (ICD10-I70.0). ?  ?05/12/2021 Imaging  ? EXAM: ?CT CHEST, ABDOMEN AND PELVIS WITHOUT CONTRAST ? ?IMPRESSION: ?1. Fullness in the region of the pancreatic head and proximal body, ?poorly evaluated on today's noncontrast CT examination. The ?possibility of pancreatic neoplasm should be considered given the ?patient's history of unexplained weight loss. This could be better ?evaluated with follow-up abdominal MRI with and without IV ?gadolinium with MRCP if there is clinical concern for neoplasm. ?2. No other findings in the chest, abdomen or pelvis to account for ?the patient's unexplained weight loss. ?3. Subtle changes in the lungs concerning for developing ?interstitial lung disease. Outpatient referral to Pulmonology for ?further clinical evaluation is recommended. Follow-up nonemergent ?high-resolution chest CT could also be considered to better evaluate these findings, preferably in 3-6 months to allow for temporal changes in the appearance  of the lung parenchyma. ?4. Pulmonary nodules are stable compared to remote prior ?examinations, considered definitively benign, as above. ?5. 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 ?6. Additional incidental findings, as above. ?  ?05/26/2021 Imaging  ? EXAM: ?MRI ABDOMEN WITHOUT AND WITH CONTRAST ? ?IMPRESSION: ?1. No pancreatic mass  visualized. Segment of dilated pancreatic duct at the head of the pancreas measuring up to 7 mm in diameter with normal caliber of the duct throughout the body and tail. ?2. Mild biliary ductal dilatation whi

## 2021-08-13 NOTE — Patient Instructions (Signed)
Vitamin B12 Deficiency ?Vitamin B12 deficiency occurs when the body does not have enough vitamin B12, which is an important vitamin. The body needs this vitamin: ?To make red blood cells. ?To make DNA. This is the genetic material inside cells. ?To help the nerves work properly so they can carry messages from the brain to the body. ?Vitamin B12 deficiency can cause various health problems, such as a low red blood cell count (anemia) or nerve damage. ?What are the causes? ?This condition may be caused by: ?Not eating enough foods that contain vitamin B12. ?Not having enough stomach acid and digestive fluids to properly absorb vitamin B12 from the food that you eat. ?Certain digestive system diseases that make it hard to absorb vitamin B12. These diseases include Crohn's disease, chronic pancreatitis, and cystic fibrosis. ?A condition in which the body does not make enough of a protein (intrinsic factor), resulting in too few red blood cells (pernicious anemia). ?Having a surgery in which part of the stomach or small intestine is removed. ?Taking certain medicines that make it hard for the body to absorb vitamin B12. These medicines include: ?Heartburn medicines (antacids and proton pump inhibitors). ?Certain antibiotic medicines. ?Some medicines that are used to treat diabetes, tuberculosis, gout, or high cholesterol. ?What increases the risk? ?The following factors may make you more likely to develop a B12 deficiency: ?Being older than age 50. ?Eating a vegetarian or vegan diet, especially while you are pregnant. ?Eating a poor diet while you are pregnant. ?Taking certain medicines. ?Having alcoholism. ?What are the signs or symptoms? ?In some cases, there are no symptoms of this condition. If the condition leads to anemia or nerve damage, various symptoms can occur, such as: ?Weakness. ?Fatigue. ?Loss of appetite. ?Weight loss. ?Numbness or tingling in your hands and feet. ?Redness and burning of the  tongue. ?Confusion or memory problems. ?Depression. ?Sensory problems, such as color blindness, ringing in the ears, or loss of taste. ?Diarrhea or constipation. ?Trouble walking. ?If anemia is severe, symptoms can include: ?Shortness of breath. ?Dizziness. ?Rapid heart rate (tachycardia). ?How is this diagnosed? ?This condition may be diagnosed with a blood test to measure the level of vitamin B12 in your blood. You may also have other tests, including: ?A group of tests that measure certain characteristics of blood cells (complete blood count, CBC). ?A blood test to measure intrinsic factor. ?A procedure where a thin tube with a camera on the end is used to look into your stomach or intestines (endoscopy). ?Other tests may be needed to discover the cause of B12 deficiency. ?How is this treated? ?Treatment for this condition depends on the cause. This condition may be treated by: ?Changing your eating and drinking habits, such as: ?Eating more foods that contain vitamin B12. ?Drinking less alcohol or no alcohol. ?Getting vitamin B12 injections. ?Taking vitamin B12 supplements. Your health care provider will tell you which dosage is best for you. ?Follow these instructions at home: ?Eating and drinking ? ?Eat lots of healthy foods that contain vitamin B12, including: ?Meats and poultry. This includes beef, pork, chicken, turkey, and organ meats, such as liver. ?Seafood. This includes clams, rainbow trout, salmon, tuna, and haddock. ?Eggs. ?Cereal and dairy products that are fortified. This means that vitamin B12 has been added to the food. Check the label on the package to see if the food is fortified. ?The items listed above may not be a complete list of recommended foods and beverages. Contact a dietitian for more information. ?General instructions ?Get any   injections that are prescribed by your health care provider. ?Take supplements only as told by your health care provider. Follow the directions carefully. ?Do  not drink alcohol if your health care provider tells you not to. In some cases, you may only be asked to limit alcohol use. ?Keep all follow-up visits as told by your health care provider. This is important. ?Contact a health care provider if: ?Your symptoms come back. ?Get help right away if you: ?Develop shortness of breath. ?Have a rapid heart rate. ?Have chest pain. ?Become dizzy or lose consciousness. ?Summary ?Vitamin B12 deficiency occurs when the body does not have enough vitamin B12. ?The main causes of vitamin B12 deficiency include dietary deficiency, digestive diseases, pernicious anemia, and having a surgery in which part of the stomach or small intestine is removed. ?In some cases, there are no symptoms of this condition. If the condition leads to anemia or nerve damage, various symptoms can occur, such as weakness, shortness of breath, and numbness. ?Treatment may include getting vitamin B12 injections or taking vitamin B12 supplements. Eat lots of healthy foods that contain vitamin B12. ?This information is not intended to replace advice given to you by your health care provider. Make sure you discuss any questions you have with your health care provider. ?Document Revised: 10/16/2020 Document Reviewed: 12/28/2017 ?Elsevier Patient Education ? 2022 [employer] Inc[employer] ? ?

## 2021-08-14 ENCOUNTER — Encounter
Payer: Medicare Other | Attending: Physical Medicine and Rehabilitation | Admitting: Physical Medicine and Rehabilitation

## 2021-08-14 ENCOUNTER — Telehealth: Payer: Self-pay | Admitting: Hematology

## 2021-08-14 ENCOUNTER — Ambulatory Visit (INDEPENDENT_AMBULATORY_CARE_PROVIDER_SITE_OTHER): Payer: Medicare Other | Admitting: Nurse Practitioner

## 2021-08-14 ENCOUNTER — Encounter: Payer: Self-pay | Admitting: Nurse Practitioner

## 2021-08-14 VITALS — BP 130/62 | HR 120 | Ht 63.0 in | Wt 91.5 lb

## 2021-08-14 DIAGNOSIS — Z79891 Long term (current) use of opiate analgesic: Secondary | ICD-10-CM | POA: Insufficient documentation

## 2021-08-14 DIAGNOSIS — G894 Chronic pain syndrome: Secondary | ICD-10-CM | POA: Diagnosis not present

## 2021-08-14 DIAGNOSIS — K5904 Chronic idiopathic constipation: Secondary | ICD-10-CM | POA: Diagnosis not present

## 2021-08-14 DIAGNOSIS — G8929 Other chronic pain: Secondary | ICD-10-CM

## 2021-08-14 DIAGNOSIS — R634 Abnormal weight loss: Secondary | ICD-10-CM | POA: Diagnosis not present

## 2021-08-14 DIAGNOSIS — K5903 Drug induced constipation: Secondary | ICD-10-CM | POA: Insufficient documentation

## 2021-08-14 DIAGNOSIS — R1084 Generalized abdominal pain: Secondary | ICD-10-CM | POA: Insufficient documentation

## 2021-08-14 DIAGNOSIS — R109 Unspecified abdominal pain: Secondary | ICD-10-CM

## 2021-08-14 DIAGNOSIS — Z5181 Encounter for therapeutic drug level monitoring: Secondary | ICD-10-CM | POA: Insufficient documentation

## 2021-08-14 MED ORDER — GI COCKTAIL ~~LOC~~: ORAL | 2 refills | Status: DC
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
## 2021-08-14 NOTE — Patient Instructions (Addendum)
Continue colace . Take two at bedtime.  ?Not getting adequate fluids. Increase water to a minimum of 48 oz daily.  ?Add 1 capful of Miralax daily ?Glycerin suppositories as needed.to help with passage of stool   ?Will refill GI cocktail.  ?  ?Thank you for trusting me with your gastrointestinal care!   ? ?Emily Savoy, NP ? ? ? ? ?BMI: ? ?If you are age 81 or older, your body mass index should be between 23-30. Your Body mass index is 16.21 kg/m?[place] If this is out of the aforementioned range listed, please consider follow up with your Primary Care Provider. ? ?If you are age 64 or younger, your body mass index should be between 19-25. Your Body mass index is 16.21 kg/m?Marland Kitchen If this is out of the aformentioned range listed, please consider follow up with your Primary Care Provider.  ? ?MY CHART: ? ?The [facility] providers would like to encourage you to use [facility] to communicate with providers for non-urgent requests or questions.  Due to long hold times on the telephone, sending your provider a message by [facility] may be a faster and more efficient way to get a response.  Please allow 48 business hours for a response.  Please remember that this is for non-urgent requests.  ? ?

## 2021-08-14 NOTE — Telephone Encounter (Signed)
Scheduled follow-up appointments per 4/12 los. Patient is aware. Mailed calendar. ?

## 2021-08-15 ENCOUNTER — Ambulatory Visit
Admission: RE | Admit: 2021-08-15 | Discharge: 2021-08-15 | Disposition: A | Discharge status: Home / Self Care | Payer: Medicare Other | Source: Ambulatory Visit | Attending: Family Medicine | Admitting: Family Medicine

## 2021-08-15 DIAGNOSIS — Z1231 Encounter for screening mammogram for malignant neoplasm of breast: Secondary | ICD-10-CM

## 2021-08-15 NOTE — Progress Notes (Signed)
? ?Subjective:  ? ? Patient ID: Emily [name], female    DOB: 1940-09-19, 81 y.o.   MRN: [id number] ? ?HPI  ?An audio/video tele-health visit is felt to be the most appropriate encounter for this patient at this time. This is a follow up tele-visit via phone. The patient is at home. MD is at office. Prior to scheduling this appointment, our staff discussed the limitations of evaluation and management by telemedicine and the availability of in-person appointments. The patient expressed understanding and agreed to proceed.  ? ?Emily [name] is a 81 year old woman who presents for f/u abdominal pain, constipation, dizziness, and decreased appetite.  ? ?1) Abdominal pain secondary surgery ?-pain continues to be severe ?-pain moves in stomach and is present throughout the stomach ?-she has been taking 2 hydrocodone per day and this does help.  ?-she thinks her PCP prescribed Protonix.  ?-she reviewed her MRI abdomen with Dr.Nandigam and was told there are no abnormalities requiring intervention.  ?-reviewed her MRI abdomen from January with her ?-discussed CT results ?-Lidocaine patches have been helping and she has enough. ?-She was told by Dr. Silverio Decamp that her pain may be due to adhesions.  ?-She also takes Gabapentin and Tizanidine. She needs refill of the tizanidine. She does find that these helps. The lidocaine patch also helps. She has never tried a e-stim device. The Biofreeze also helps ?-Cymbalta caused her to be nauseous.  ? Average pain is 9/10. Gabapentin helps her sleep, but does not help with the pain during the day. Pain right now is 7/10. She takes something for spasms and this helps for a while. She often has to bend over because the pain is so bad. The pain feels sharp, stabbing, and aching. She takes Tylenol and it stops her headache.  ?-she has not seen any benefits from [place] so we will not try again ?-she asks about what medication I had discussed with her last visit.  ?-she was in too much pain to  go to her friend's funeral.  ?-She asks about the celiac plexus block we had discussed previously.  ?-stopped the cymbalta due to nausea, but given her increase in pain she may want to restart it ?-she  has found biofreeze and hydrocodone beneficial- she takes about 2 hydrocodone per day.  ?-says that she was told to stop applying topical hemp ?-she started having severe abdominal pain this week. 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 ?-she feels that she is skin and bones ?-says her weight is down in 70s, was 103 on our last in-person recording ?-she would like more percocet as she feels this is the only thing that helps ? ?2) Nausea: ?-she feels nauseous all the time but has not thrown up. ?-phenergan helps, she is taking once every 6 hours, does not refill.  ? ?3) Left rib pain: ?-has still been excruciating, is intermittent ?-discussed that XR results show no acute fractures ? ?4) Dizziness ?-worsened since started on mirtazepine ?-also present with amitriptyline ?-she has been doing a better job of staying well hydrated,  she is drinking 2 bottles of water per day ?-she has not tried meclizine ? ?5) IBS ?-had incontinent episode when she was out with her daughter.  ?-she has imodium ?-she feels so sick ? ?5) decreased appetite ?-she was started on mirtazepine and has since been experiencing worsening dizziness ?-she asks whether she should have a PEG placed ?-she does not note much weakness, but is losing weight ?-on Sunday she could only eat an apple.  ?-not eating much ? ?6) Constipation: ?-she does not like milk of  magnesia ?-previously had some benefit with stool softeners- was taking colace and miralax ?-was told that opioids can cause this ?-had a good BM yesterday ?-she likes prunes ?-she said her pharmacy did not have the milk of magnesia ? ? ? ?Pain Inventory ?Average Pain 10 ?Pain Right Now 9 ?My pain is sharp, stabbing and aching ? ?In the last 24 hours, has pain interfered with the following? ?General activity 9 ?Relation with others 9 ?Enjoyment of life 9 ?What TIME of day is your pain at its worst? daytime ?Sleep (in general) Good ? ?Pain is worse with: bending, inactivity, and some activites ?Pain improves with: rest and medication ?Relief from Meds: 9 ? ?walk with assistance ?use a walker ?ability to climb steps?  yes ?do you drive?  no ? ?not employed: date last employed . ?I need assistance with the following:  household duties and shopping ? ?numbness ?tremor ?dizziness ?anxiety ? ?new ? ?new ? ? ? ?Family History  ?Problem Relation Age of Onset  ? Heart failure Mother   ? Heart failure Father   ? Cancer Paternal Aunt   ?     throat cancer   ? Rectal cancer Neg Hx   ? Stomach cancer Neg Hx   ? Colon polyps Neg Hx   ? Esophageal cancer Neg Hx   ? Colon cancer Neg Hx   ? ?Social History  ? ?Socioeconomic History  ? Marital status: Single  ?  Spouse name: Not on file  ? Number of children: 1  ? Years of education: 10 th  ? Highest education level: Not on file  ?Occupational History  ? Occupation: RETIRED  ?  Employer: RETIRED  ?Tobacco Use  ? Smoking status: Every Day  ?  Packs/day: 0.50  ?  Types: Cigarettes  ? Smokeless tobacco: Never  ?Vaping Use  ? Vaping Use: Never used  ?Substance and Sexual Activity  ? Alcohol use: Not Currently  ?  Comment: only on birthday  ? Drug use: No  ? Sexual activity: Not on file  ?Other Topics Concern  ? Not on file  ?Social History Narrative  ? 05/25/19 lives alone, has an aid 4xweek  ? Right Handed  ? Drinks 4-6 cups caffeine daily  ?   ? ?Social Determinants of Health   ? ?Financial Resource Strain: Not on file  ?Food Insecurity: Not on file  ?Transportation Needs: Not on file  ?Physical Activity: Not on file  ?Stress: Not on file  ?Social Connections: Not on file  ? ?Past Surgical History:  ?Procedure Laterality Date  ? APPENDECTOMY    ? belsey procedure  10/08  ? for undone Nissen Fundoplication  ? CARDIAC CATHETERIZATION  4/06  ? CARDIOVASCULAR STRESS TEST  4/08  ? CHOLECYSTECTOMY  1/09  ? COLONOSCOPY  1997,1998,04/2007  ? CYSTOSCOPY/URETEROSCOPY/HOLMIUM LASER/STENT PLACEMENT Right 08/13/2017  ? Procedure: CYSTOSCOPY/URETEROSCOPY/STENT PLACEMENT;  Surgeon: Ceasar Mons, MD;  Location: [facility];  Service: Urology;  Laterality: Right;  ? ESOPHAGOGASTRODUODENOSCOPY  [phone], [phone], 07/2009  ? ESOPHAGOGASTRODUODENOSCOPY (EGD) WITH PROPOFOL N/A 02/28/2019  ? Procedure: ESOPHAGOGASTRODUODENOSCOPY (EGD) WITH PROPOFOL;  Surgeon: Mauri Pole, MD;  Location: [facility];  Service: Endoscopy;  Laterality: N/A;  ? Gastrojejunostomy and feeding jeunal tube, decompessive PEG  12/10 and 1/11  ? HERNIA REPAIR    ? Twice  ? IR US GUIDE VASC ACCESS LEFT  11/07/2019  ? LAPAROSCOPIC APPENDECTOMY N/A 08/21/2016  ? Procedure: APPENDECTOMY LAPAROSCOPIC;  Surgeon: Donnie Mesa, MD;  Location: [place];  Service: General;  Laterality: N/A;  ? nissen fundoplasty    ? ORIF FINGER FRACTURE  04/20/2011  ? Procedure: OPEN REDUCTION INTERNAL FIXATION (ORIF) METACARPAL (FINGER) FRACTURE;  Surgeon: Tennis Must;  Location: [place];  Service: Orthopedics;  Laterality: Left;  open reduction internal fixation left small proximal phalanx  ? ROTATOR CUFF REPAIR  2010  ? THORACOTOMY    ? TOTAL GASTRECTOMY  2012  ? Roux en Y esophagojejunostomy  ? UPPER GASTROINTESTINAL ENDOSCOPY  04/08/2016  ? ?Past Medical History:  ?Diagnosis Date  ? Alcohol abuse   ? Alcoholic ketoacidosis   ? Allergy   ? Anemia   ? Anxiety   ? Aortic atherosclerosis ([place])   ?  Appendiceal tumor   ? Arthritis   ? legs and back  ? Barrett esophagus   ? Bilateral knee pain   ? Bloating   ? Blood in urine   ? small amount  ? Cataract   ? Clotting disorder ([place])   ? h/o dvt  ? Common bile duct di

## 2021-08-18 ENCOUNTER — Other Ambulatory Visit: Payer: Self-pay | Admitting: Physical Medicine and Rehabilitation

## 2021-08-18 ENCOUNTER — Other Ambulatory Visit: Payer: Self-pay | Admitting: Gastroenterology

## 2021-08-26 ENCOUNTER — Ambulatory Visit (INDEPENDENT_AMBULATORY_CARE_PROVIDER_SITE_OTHER): Payer: Medicare Other | Admitting: Podiatry

## 2021-08-26 ENCOUNTER — Encounter: Payer: Self-pay | Admitting: Podiatry

## 2021-08-26 DIAGNOSIS — M79674 Pain in right toe(s): Secondary | ICD-10-CM | POA: Diagnosis not present

## 2021-08-26 DIAGNOSIS — B351 Tinea unguium: Secondary | ICD-10-CM | POA: Diagnosis not present

## 2021-08-26 DIAGNOSIS — M79675 Pain in left toe(s): Secondary | ICD-10-CM

## 2021-08-26 DIAGNOSIS — N179 Acute kidney failure, unspecified: Secondary | ICD-10-CM

## 2021-08-26 DIAGNOSIS — E0842 Diabetes mellitus due to underlying condition with diabetic polyneuropathy: Secondary | ICD-10-CM

## 2021-08-26 NOTE — Progress Notes (Signed)
This patient returns to my office for at risk foot care.  This patient requires this care by a professional since this patient will be at risk due to having  Diabetes.  This patient is unable to cut nails herself since the patient cannot reach her nails.These nails are painful walking and wearing shoes.  This patient presents for at risk foot care today.  ? ?General Appearance  Alert, conversant and in no acute stress. ? ?Vascular  Dorsalis pedis and posterior tibial  pulses are palpable  bilaterally.  Capillary return is within normal limits  bilaterally. Temperature is within normal limits  bilaterally. ? ?Neurologic  Senn-Weinstein monofilament wire test absent   bilaterally. Muscle power within normal limits bilaterally. ? ?Nails Thick disfigured discolored nails with subungual debris  from hallux to fifth toes bilaterally. No evidence of bacterial infection or drainage bilaterally. ? ?Orthopedic  No limitations of motion  feet .  No crepitus or effusions noted.  No bony pathology or digital deformities noted. ? ?Skin  normotropic skin with no porokeratosis noted bilaterally.  No signs of infections or ulcers noted.   Peeling noted plantar right heel. ? ?Onychomycosis  Pain in right toes  Pain in left toes ? ?Consent was obtained for treatment procedures.   Mechanical debridement of nails 1-5  bilaterally performed with a nail nipper.  Filed with dremel without incident.  Discussed the peeling with this patient.  ? ? ?Return office visit   3 months                   Told patient to return for periodic foot care and evaluation due to potential at risk complications. ? ? ?Gardiner Barefoot DPM  ?

## 2021-08-27 ENCOUNTER — Other Ambulatory Visit: Payer: Self-pay

## 2021-08-27 ENCOUNTER — Inpatient Hospital Stay: Payer: Medicare Other

## 2021-08-27 DIAGNOSIS — D519 Vitamin B12 deficiency anemia, unspecified: Secondary | ICD-10-CM

## 2021-08-27 DIAGNOSIS — E538 Deficiency of other specified B group vitamins: Secondary | ICD-10-CM | POA: Diagnosis not present

## 2021-08-27 MED ORDER — CYANOCOBALAMIN 1000 MCG/ML IJ SOLN
1000.0000 ug | Freq: Once | INTRAMUSCULAR | Status: AC
  Administered 2021-08-27: 1000 ug via INTRAMUSCULAR
  Filled 2021-08-27: qty 1

## 2021-08-29 ENCOUNTER — Encounter (HOSPITAL_BASED_OUTPATIENT_CLINIC_OR_DEPARTMENT_OTHER): Payer: Medicare Other | Admitting: Registered Nurse

## 2021-08-29 ENCOUNTER — Encounter: Payer: Self-pay | Admitting: Registered Nurse

## 2021-08-29 VITALS — BP 144/81 | HR 68 | Ht 63.0 in | Wt 94.4 lb

## 2021-08-29 DIAGNOSIS — R1084 Generalized abdominal pain: Secondary | ICD-10-CM | POA: Diagnosis present

## 2021-08-29 DIAGNOSIS — K5903 Drug induced constipation: Secondary | ICD-10-CM | POA: Diagnosis present

## 2021-08-29 DIAGNOSIS — Z79891 Long term (current) use of opiate analgesic: Secondary | ICD-10-CM

## 2021-08-29 DIAGNOSIS — Z5181 Encounter for therapeutic drug level monitoring: Secondary | ICD-10-CM

## 2021-08-29 DIAGNOSIS — G894 Chronic pain syndrome: Secondary | ICD-10-CM

## 2021-08-29 MED ORDER — OXYCODONE-ACETAMINOPHEN 7.5-325 MG PO TABS: 1.0000 | ORAL_TABLET | Freq: Two times a day (BID) | ORAL | 0 refills | Status: DC | PRN
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
## 2021-09-01 ENCOUNTER — Other Ambulatory Visit: Payer: Medicare Other | Admitting: Hospice

## 2021-09-01 DIAGNOSIS — R269 Unspecified abnormalities of gait and mobility: Secondary | ICD-10-CM

## 2021-09-01 DIAGNOSIS — G8929 Other chronic pain: Secondary | ICD-10-CM

## 2021-09-01 DIAGNOSIS — Z515 Encounter for palliative care: Secondary | ICD-10-CM

## 2021-09-01 DIAGNOSIS — K5901 Slow transit constipation: Secondary | ICD-10-CM

## 2021-09-01 DIAGNOSIS — E43 Unspecified severe protein-calorie malnutrition: Secondary | ICD-10-CM

## 2021-09-01 NOTE — Progress Notes (Signed)
? ? ?Manufacturing engineer ?Community Palliative Care Consult Note ?Telephone: [phone]  ?Fax: [phone] ? ?PATIENT NAME: Emily [name] ?DOB: [birth year] ?MRN: [id number] ? ?PRIMARY CARE PROVIDER:  Dr Reece Levy ? ?REFERRING PROVIDER: Buzzy Han, MD ?Buzzy Han, MD ?[place] ?[place],  [place] [address] ? ?RESPONSIBLE PARTY:  Self [phone] best number to call ?Emergency contact: [phone] daughter [name] ?[facility]- caregiver- [place] Fri ?Contact Information   ? ? Name Relation Home Work Mobile  ? [name], [name] Daughter [phone]  [phone]  ? [name] Sister [phone]    ? [name],[name] Redmond Pulling [phone]    ? ?  ?TELEHEALTH VISIT STATEMENT ?Due to the COVID-19 crisis, this visit was done via telemedicine and it was initiated and consent by this patient and or family. Video-audio (telehealth) contact was unable to be done due to technical barriers from the patient?s side.  ?I connected with patient OR PROXY by a telephone  and verified that I am speaking with the correct person. I discussed the limitations of evaluation and management by telemedicine. The patient expressed understanding and agreed to proceed.  ?Visit is to build trust and highlight Palliative Medicine as specialized medical care for people living with serious illness, aimed at facilitating better quality of life through symptoms relief, assisting with advance care planning and complex medical decision making. This is a follow up visit. ? ?RECOMMENDATIONS/PLAN:  ? ?Advance Care Planning/Code Status: Patient is a Full code ? ?Goals of Care: Goals of care include to maximize quality of life and symptom management. ? ?Visit consisted of counseling and education dealing with the complex and emotionally intense issues of symptom management and palliative care in the setting of serious and potentially life-threatening illness. Palliative care team will continue to support  patient, patient's family, and medical team. ? 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 Followed by Oncology for surveillance; f/u appointment 08/13/21. Epic report - -Abdomen MRI on 05/26/21 showed no pancreatic mass or other concerns, -CU-64 PET on 08/11/21 was negative. Continue surveillance.  ?Protein caloric malnutrition with abnormal weight loss: severe, weight loss, muscle wasting. Current weight 94 Ib down from 130 Ib a year ago, last month was 95 Ib. Current Albumin 3.2 05/12/21 down from 3.6 03/10/21. Ensure BID.  Continue small frequent meals 4-6 times a day, and Mirtazapine as ordered to help boost appetite.  ?Insomnia: Improved. Sleep hygiene reiterated. Encouraged to take Mirtazapine 15 mg currently ordered at  bed time.  ?Abdominal pain:  pain is often when she wakes up in the mornings. Followed by GI, and  pain clinic. Pain is chronic, likely related to post surgical abdominal pain. Patient has gabapentin 400 mg 3 times daily, Lidocaine patch, Tizanidine - GI cocktail from GI. Patient has oxycodone on hand. GI consult recently last month. Last pain clinic visit 08/29/21. Continue with planned f/u visits.  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  Encouraged use of rolling walker. Falls precautions discussed.  Follow-up with neurologist as planned ?Anemia due to B12 deficiency: Patient continues on B12 shots as ordered.  Next shot scheduled for 09/10/21 ?Routine CBC B12, CMP. ?Constipation: likely related to hx of IBS. She reports Linzess is not helpful. Miralax daily is helpful. Discussed vegetable and fruits in her meals as able to.  ?Follow up: Palliative care will continue to follow for complex medical decision making, advance care planning, and clarification of goals. Return 4 weeks or prn. Encouraged to call provider sooner with any concerns. ?  ?  CHIEF COMPLAINT: Palliative follow up visit/abdominal pain ?  ?HISTORY OF PRESENT  ILLNESS:  Emily [name] a 81 y.o. 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 She endorsed abdominal pain, significantly improved after taking oxycodone; no nausea/vomiting, appetite is fair to good.  History obtained from review of EMR, discussion with primary team, family and/or patient. Records reviewed and summarized above. All 10 point systems reviewed and are negative except as documented in history of present illness above ? ?Review and summarization of Epic records shows history from other than patient.  ? ?Palliative Care was asked to follow this patient o help address complex decision making in the context of advance care planning and goals of care clarification.  ? ?PERTINENT MEDICATIONS:  ?Outpatient Encounter Medications as of 09/01/2021  ?Medication Sig  ? acetaminophen (TYLENOL) 500 MG tablet Take 500 mg by mouth every 8 (eight) hours as needed (for pain.).  ? Alum & Mag Hydroxide-Simeth (GI COCKTAIL) SUSP suspension 90 ML 2% Viscous lidocain  Take 10 ML's every 8 hours as needed  ?90ML Dicyclomine '10mg'$ /57m ?270 ML Maalox '400mg'$   ? AMBULATORY NON FORMULARY MEDICATION [facility]: [facility]   For s/p multiple abdominal surgerys chronic abdominal and back pain  ? amitriptyline (ELAVIL) 10 MG tablet Take 1 tablet (10 mg total) by mouth at bedtime.  ? ammonium lactate (AMLACTIN) 12 % cream APPLY EXTERNALLY TO THE AFFECTED AREA AS NEEDED FOR DRY SKIN  ? clotrimazole-betamethasone (LOTRISONE) cream Apply 1 application topically 2 (two) times daily as needed (rash).   ? cyanocobalamin (,VITAMIN B-12,) 1000 MCG/ML injection Inject 1 mL (1,000 mcg total) into the muscle every 14  (fourteen) days.  ? dicyclomine (BENTYL) 10 MG capsule TAKE 1 CAPSULE BY MOUTH EVERY 8 HOURS AS NEEDED FOR SPASMS  ? diphenhydrAMINE (BENADRYL) 12.5 MG chewable tablet Chew 1 tablet (12.5 mg total) by mouth 4 (four) times daily as needed for allergies.  ? esomeprazole (NEXIUM) 20 MG capsule TAKE 1 CAPSULE(20 MG) BY MOUTH DAILY AS NEEDED FOR GERD  ? hydrOXYzine (ATARAX) 50 MG tablet Take 50 mg by mouth daily.  ? lidocaine (LIDODERM) 5 % USE 1 PATCH EXTERNALLY ONE DAILY REMOVE AND DISCARD WITHIN 12 HOURS.  ? LINZESS 145 MCG CAPS capsule TAKE 1 CAPSULE(145 MCG) BY MOUTH DAILY BEFORE BREAKFAST  ? loperamide (IMODIUM) 2 MG capsule Take 1 capsule (2 mg total) by mouth 4 (four) times daily as needed for diarrhea or loose stools.  ? losartan (COZAAR) 25 MG tablet Take 25 mg by mouth daily.  ? meclizine (ANTIVERT) 12.5 MG tablet Take 1 tablet (12.5 mg total) by mouth 3 (three) times daily as needed for dizziness.  ? megestrol (MEGACE) 40 MG/ML suspension SHAKE LIQUID AND TAKE 10 ML(400 MG) BY MOUTH DAILY  ? Melatonin 3 MG CAPS Take 1 capsule (3 mg total) by mouth at bedtime.  ? metaxalone (SKELAXIN) 800 MG tablet Take 0.5 tablets (400 mg total) by mouth 3 (three) times daily as needed for muscle spasms.  ? mirtazapine (REMERON) 30 MG tablet Take 30 mg by mouth at bedtime.  ? oxybutynin (DITROPAN XL) 5 MG 24 hr tablet Take 1 tablet (5 mg total) by mouth at bedtime.  ? oxyCODONE-acetaminophen (PERCOCET) 7.5-325 MG tablet Take 1 tablet by mouth 2 (two)  times daily as needed for moderate pain. Do Not Fill Before 09/06/2021  ? promethazine (PHENERGAN) 12.5 MG tablet TAKE 1 TABLET(12.5 MG) BY MOUTH EVERY 4 HOURS AS NEEDED FOR NAUSEA OR VOMITING  ? RESTASIS 0.05 % ophthalmic emulsion Place 1 drop into both eyes 2 (two) times daily.  ? ?No facility-administered encounter medications on file as of 09/01/2021.  ? ? ?HOSPICE ELIGIBILITY/DIAGNOSIS: TBD ? ?PAST MEDICAL HISTORY:  ?Past Medical History:  ?Diagnosis Date  ? Alcohol abuse   ?  Alcoholic ketoacidosis   ? Allergy   ? Anemia   ? Anxiety   ? Aortic atherosclerosis ([place])   ? Appendiceal tumor   ? Arthritis   ? legs and back  ? Barrett esophagus   ? Bilateral knee pain   ? Bloating   ? Blood i

## 2021-09-10 ENCOUNTER — Ambulatory Visit: Payer: Medicare Other | Admitting: Hematology

## 2021-09-10 ENCOUNTER — Inpatient Hospital Stay: Payer: Medicare Other

## 2021-09-10 ENCOUNTER — Inpatient Hospital Stay: Payer: Medicare Other | Attending: Hematology

## 2021-09-10 ENCOUNTER — Other Ambulatory Visit: Payer: Self-pay

## 2021-09-10 DIAGNOSIS — C7A8 Other malignant neuroendocrine tumors: Secondary | ICD-10-CM | POA: Diagnosis present

## 2021-09-10 DIAGNOSIS — D519 Vitamin B12 deficiency anemia, unspecified: Secondary | ICD-10-CM

## 2021-09-10 DIAGNOSIS — D649 Anemia, unspecified: Secondary | ICD-10-CM

## 2021-09-10 DIAGNOSIS — D508 Other iron deficiency anemias: Secondary | ICD-10-CM

## 2021-09-10 DIAGNOSIS — E538 Deficiency of other specified B group vitamins: Secondary | ICD-10-CM | POA: Diagnosis present

## 2021-09-10 LAB — CBC WITH DIFFERENTIAL/PLATELET
Abs Immature Granulocytes: 0.02 10*3/uL (ref 0.00–0.07)
Basophils Absolute: 0 10*3/uL (ref 0.0–0.1)
Basophils Relative: 1 %
Eosinophils Absolute: 0 10*3/uL (ref 0.0–0.5)
Eosinophils Relative: 0 %
HCT: 37.5 % (ref 36.0–46.0)
Hemoglobin: 12.3 g/dL (ref 12.0–15.0)
Immature Granulocytes: 0 %
Lymphocytes Relative: 24 %
Lymphs Abs: 1.5 10*3/uL (ref 0.7–4.0)
MCH: 24.5 pg — ABNORMAL LOW (ref 26.0–34.0)
MCHC: 32.8 g/dL (ref 30.0–36.0)
MCV: 74.6 fL — ABNORMAL LOW (ref 80.0–100.0)
Monocytes Absolute: 0.3 10*3/uL (ref 0.1–1.0)
Monocytes Relative: 5 %
Neutro Abs: 4.5 10*3/uL (ref 1.7–7.7)
Neutrophils Relative %: 70 %
Platelets: 231 10*3/uL (ref 150–400)
RBC: 5.03 MIL/uL (ref 3.87–5.11)
RDW: 14.5 % (ref 11.5–15.5)
WBC: 6.4 10*3/uL (ref 4.0–10.5)
nRBC: 0 % (ref 0.0–0.2)

## 2021-09-10 LAB — COMPREHENSIVE METABOLIC PANEL
ALT: 28 U/L (ref 0–44)
AST: 43 U/L — ABNORMAL HIGH (ref 15–41)
Albumin: 3.6 g/dL (ref 3.5–5.0)
Alkaline Phosphatase: 80 U/L (ref 38–126)
Anion gap: 7 (ref 5–15)
BUN: 12 mg/dL (ref 8–23)
CO2: 25 mmol/L (ref 22–32)
Calcium: 8.8 mg/dL — ABNORMAL LOW (ref 8.9–10.3)
Chloride: 106 mmol/L (ref 98–111)
Creatinine, Ser: 0.99 mg/dL (ref 0.44–1.00)
GFR, Estimated: 57 mL/min — ABNORMAL LOW (ref >60)
Glucose, Bld: 116 mg/dL — ABNORMAL HIGH (ref 70–99)
Potassium: 4.3 mmol/L (ref 3.5–5.1)
Sodium: 138 mmol/L (ref 135–145)
Total Bilirubin: 0.5 mg/dL (ref 0.3–1.2)
Total Protein: 6.7 g/dL (ref 6.5–8.1)

## 2021-09-10 LAB — IRON AND IRON BINDING CAPACITY (CC-WL,HP ONLY)
Iron: 89 ug/dL (ref 28–170)
Saturation Ratios: 36 % — ABNORMAL HIGH (ref 10.4–31.8)
TIBC: 251 ug/dL (ref 250–450)
UIBC: 162 ug/dL

## 2021-09-10 MED ORDER — CYANOCOBALAMIN 1000 MCG/ML IJ SOLN
1000.0000 ug | Freq: Once | INTRAMUSCULAR | Status: AC
  Administered 2021-09-10: 1000 ug via INTRAMUSCULAR
  Filled 2021-09-10: qty 1

## 2021-09-10 NOTE — Patient Instructions (Signed)
Vitamin B12 Deficiency Vitamin B12 deficiency occurs when the body does not have enough of this important vitamin. The body needs this vitamin: To make red blood cells. To make DNA. This is the genetic material inside cells. To help the nerves work properly so they can carry messages from the brain to the body. Vitamin B12 deficiency can cause health problems, such as not having enough red blood cells in the blood (anemia). This can lead to nerve damage if untreated. What are the causes? This condition may be caused by: Not eating enough foods that contain vitamin B12. Not having enough stomach acid and digestive fluids to properly absorb vitamin B12 from the food that you eat. Having certain diseases that make it hard to absorb vitamin B12. These diseases include Crohn's disease, chronic pancreatitis, and cystic fibrosis. An autoimmune disorder in which the body does not make enough of a protein (intrinsic factor) within the stomach, resulting in not enough absorption of vitamin B12. Having a surgery in which part of the stomach or small intestine is removed. Taking certain medicines that make it hard for the body to absorb vitamin B12. These include: Heartburn medicines, such as antacids and proton pump inhibitors. Some medicines that are used to treat diabetes. What increases the risk? The following factors may make you more likely to develop a vitamin B12 deficiency: Being an older adult. Eating a vegetarian or vegan diet that does not include any foods that come from animals. Eating a poor diet while you are pregnant. Taking certain medicines. Having alcoholism. What are the signs or symptoms? In some cases, there are no symptoms of this condition. If the condition leads to anemia or nerve damage, various symptoms may occur, such as: Weakness. Tiredness (fatigue). Loss of appetite. Numbness or tingling in your hands and feet. Redness and burning of the tongue. Depression,  confusion, or memory problems. Trouble walking. If anemia is severe, symptoms can include: Shortness of breath. Dizziness. Rapid heart rate. How is this diagnosed? This condition may be diagnosed with a blood test to measure the level of vitamin B12 in your blood. You may also have other tests, including: A group of tests that measure certain characteristics of blood cells (complete blood count, CBC). A blood test to measure intrinsic factor. A procedure where a thin tube with a camera on the end is used to look into your stomach or intestines (endoscopy). Other tests may be needed to discover the cause of the deficiency. How is this treated? Treatment for this condition depends on the cause. This condition may be treated by: Changing your eating and drinking habits, such as: Eating more foods that contain vitamin B12. Drinking less alcohol or no alcohol. Getting vitamin B12 injections. Taking vitamin B12 supplements by mouth (orally). Your health care provider will tell you which dose is best for you. Follow these instructions at home: Eating and drinking  Include foods in your diet that come from animals and contain a lot of vitamin B12. These include: Meats and poultry. This includes beef, pork, chicken, turkey, and organ meats, such as liver. Seafood. This includes clams, rainbow trout, salmon, tuna, and haddock. Eggs. Dairy foods such as milk, yogurt, and cheese. Eat foods that have vitamin B12 added to them (are fortified), such as ready-to-eat breakfast cereals. Check the label on the package to see if a food is fortified. The items listed above may not be a complete list of foods and beverages you can eat and drink. Contact a dietitian for   more information. Alcohol use Do not drink alcohol if: Your health care provider tells you not to drink. You are pregnant, may be pregnant, or are planning to become pregnant. If you drink alcohol: Limit how much you have to: 0-1 drink a  day for women. 0-2 drinks a day for men. Know how much alcohol is in your drink. In the [place] one drink equals one 12 oz bottle of beer (355 mL), one 5 oz glass of wine (148 mL), or one 1 oz glass of hard liquor (44 mL). General instructions Get vitamin B12 injections if told to by your health care provider. Take supplements only as told by your health care provider. Follow the directions carefully. Keep all follow-up visits. This is important. Contact a health care provider if: Your symptoms come back. Your symptoms get worse or do not improve with treatment. Get help right away: You develop shortness of breath. You have a rapid heart rate. You have chest pain. You become dizzy or you faint. These symptoms may be an emergency. Get help right away. Call 911. Do not wait to see if the symptoms will go away. Do not drive yourself to the hospital. Summary Vitamin B12 deficiency occurs when the body does not have enough of this important vitamin. Common causes include not eating enough foods that contain vitamin B12, not being able to absorb vitamin B12 from the food that you eat, having a surgery in which part of the stomach or small intestine is removed, or taking certain medicines. Eat foods that have vitamin B12 in them. Treatment may include making a change in the way you eat and drink, getting vitamin B12 injections, or taking vitamin B12 supplements. This information is not intended to replace advice given to you by your health care provider. Make sure you discuss any questions you have with your health care provider. Document Revised: 12/13/2020 Document Reviewed: 12/13/2020 Elsevier Patient Education  2023 [employer] Inc.  

## 2021-09-11 LAB — FERRITIN: Ferritin: 175 ng/mL (ref 11–307)

## 2021-09-12 LAB — CHROMOGRANIN A: Chromogranin A (ng/mL): 88.6 ng/mL (ref 0.0–101.8)

## 2021-09-16 ENCOUNTER — Telehealth: Payer: Commercial Managed Care - HMO | Admitting: Physical Medicine and Rehabilitation

## 2021-09-16 LAB — METHYLMALONIC ACID, SERUM: Methylmalonic Acid, Quantitative: 147 nmol/L (ref 0–378)

## 2021-09-19 ENCOUNTER — Telehealth: Payer: Self-pay | Admitting: Nurse Practitioner

## 2021-09-19 NOTE — Telephone Encounter (Signed)
Can pt be scheduled for EGD or does she need an office visit first?

## 2021-09-19 NOTE — Telephone Encounter (Signed)
Spoke with pt and she stated that she was having difficulty swallowing. Food gets stuck at the bottom of her neck and sits then it moves down her esophagus and sits in her chest then eventually it goes down. Pt reports that she is chewing food well and states that she has difficulty swallowing all foods. Pt states she is able to swallow liquids without difficulty most of the time.

## 2021-09-19 NOTE — Telephone Encounter (Signed)
Called pt back and let her know she would need to go to the ER if she is not able to tolerate liquids. Pt stated she isn't having difficulty with liquids and stated she is able to swallow her medications without difficulty, she only has difficulty with food. Asked pt if it was any particular food that was causing her issues and pt reported it was all food. Pt states she eats slowly and chews well but still has difficulty.

## 2021-09-19 NOTE — Telephone Encounter (Signed)
Inbound call from patient requesting to speak to nurse. Patient stated she believes she needs an EGD. Patient is having issues swallowing and is wanting to discuss her issues. Please advise.

## 2021-09-19 NOTE — Telephone Encounter (Signed)
Unfortunately she has chronic esophageal dysmotility with decreased esophageal clearance secondary to multiple surgeries she has had. If patient feels she is not tolerating any liquids then will have to come to ER to exclude food impaction

## 2021-09-22 NOTE — Telephone Encounter (Signed)
Spoke with pt. Pt stated she was following up from call on Friday and wanted to be scheduled for an EGD. Pt stated she is still having the same symptoms as on Friday and is still able to tolerate liquids. Let pt know she can take small bites and chew thoroughly and supplement with boost and ensure to maintain adequate nutrition. Pt verbalized understanding and stated she notices if she drinks water it helps her get the food down but that initially it gets stuck in her chest.

## 2021-09-22 NOTE — Telephone Encounter (Signed)
Attempted to call pt back but was unable to leave voicemail.

## 2021-09-22 NOTE — Telephone Encounter (Signed)
Patient called and asked for a return phone call. 

## 2021-09-23 ENCOUNTER — Other Ambulatory Visit: Payer: Self-pay

## 2021-09-23 DIAGNOSIS — R1310 Dysphagia, unspecified: Secondary | ICD-10-CM

## 2021-09-23 NOTE — Telephone Encounter (Signed)
Please schedule swallow test with barium esophagogram to evaluate, will be less invasive and will provide information to exclude any severe stricture. Thanks

## 2021-09-23 NOTE — Addendum Note (Signed)
Addended by: Berniece Salines A on: 09/23/2021 10:17 AM   Modules accepted: Orders

## 2021-09-23 NOTE — Telephone Encounter (Signed)
Order placed and message sent to radiology scheduling. Called pt to let her know recommendations. Pt verbalized understanding and had no other concerns at end of call.

## 2021-09-24 ENCOUNTER — Telehealth: Payer: Self-pay

## 2021-09-24 ENCOUNTER — Other Ambulatory Visit: Payer: Self-pay

## 2021-09-24 ENCOUNTER — Inpatient Hospital Stay: Payer: Medicare Other

## 2021-09-24 DIAGNOSIS — E538 Deficiency of other specified B group vitamins: Secondary | ICD-10-CM | POA: Diagnosis not present

## 2021-09-24 DIAGNOSIS — D519 Vitamin B12 deficiency anemia, unspecified: Secondary | ICD-10-CM

## 2021-09-24 MED ORDER — CYANOCOBALAMIN 1000 MCG/ML IJ SOLN
1000.0000 ug | Freq: Once | INTRAMUSCULAR | Status: AC
  Administered 2021-09-24: 1000 ug via INTRAMUSCULAR
  Filled 2021-09-24: qty 1

## 2021-09-24 NOTE — Telephone Encounter (Signed)
This nurse received a call from this patient related to urine 5 HIAA test.  She wanted to know if she should stop taking Acetaminophen for this test.  This nurse advised that she does need to avoid it because it could alter the results of the tests. This nurse verified that she got a copy of the list of medications and foods to avoid and she verified that she had the list.  Patient acknowledged understanding of list.  No further questions or concerns at this time.

## 2021-09-25 NOTE — Progress Notes (Signed)
Reviewed and agree with documentation and assessment and plan. K. Veena Tonita Bills , MD   

## 2021-10-01 ENCOUNTER — Encounter: Payer: Medicare Other | Attending: Physical Medicine and Rehabilitation | Admitting: Registered Nurse

## 2021-10-01 VITALS — BP 137/73 | HR 80 | Ht 63.0 in | Wt 93.8 lb

## 2021-10-01 DIAGNOSIS — Z79891 Long term (current) use of opiate analgesic: Secondary | ICD-10-CM

## 2021-10-01 DIAGNOSIS — Z5181 Encounter for therapeutic drug level monitoring: Secondary | ICD-10-CM

## 2021-10-01 DIAGNOSIS — G894 Chronic pain syndrome: Secondary | ICD-10-CM

## 2021-10-01 DIAGNOSIS — R1084 Generalized abdominal pain: Secondary | ICD-10-CM

## 2021-10-01 MED ORDER — OXYCODONE-ACETAMINOPHEN 7.5-325 MG PO TABS: 1.0000 | ORAL_TABLET | Freq: Two times a day (BID) | ORAL | 0 refills | Status: DC | PRN
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
## 2021-10-05 ENCOUNTER — Encounter: Payer: Self-pay | Admitting: Registered Nurse

## 2021-10-08 ENCOUNTER — Inpatient Hospital Stay: Payer: Medicare Other | Attending: Hematology

## 2021-10-08 ENCOUNTER — Other Ambulatory Visit: Payer: Self-pay

## 2021-10-08 ENCOUNTER — Ambulatory Visit (HOSPITAL_COMMUNITY)
Admission: RE | Admit: 2021-10-08 | Discharge: 2021-10-08 | Disposition: A | Discharge status: Home / Self Care | Payer: Medicare Other | Source: Ambulatory Visit | Attending: Gastroenterology | Admitting: Gastroenterology

## 2021-10-08 ENCOUNTER — Inpatient Hospital Stay: Payer: Medicare Other

## 2021-10-08 DIAGNOSIS — R1310 Dysphagia, unspecified: Secondary | ICD-10-CM | POA: Diagnosis present

## 2021-10-08 DIAGNOSIS — D519 Vitamin B12 deficiency anemia, unspecified: Secondary | ICD-10-CM

## 2021-10-08 DIAGNOSIS — E538 Deficiency of other specified B group vitamins: Secondary | ICD-10-CM | POA: Diagnosis present

## 2021-10-08 MED ORDER — CYANOCOBALAMIN 1000 MCG/ML IJ SOLN
1000.0000 ug | Freq: Once | INTRAMUSCULAR | Status: AC
  Administered 2021-10-08: 1000 ug via INTRAMUSCULAR
  Filled 2021-10-08: qty 1

## 2021-10-10 ENCOUNTER — Other Ambulatory Visit: Payer: Self-pay

## 2021-10-10 ENCOUNTER — Telehealth: Payer: Self-pay | Admitting: Nurse Practitioner

## 2021-10-10 MED ORDER — DEXLANSOPRAZOLE 60 MG PO CPDR
60.0000 mg | DELAYED_RELEASE_CAPSULE | Freq: Every day | ORAL | 3 refills | Status: DC
Start: 2021-10-10 — End: 2022-04-22

## 2021-10-10 NOTE — Telephone Encounter (Signed)
Patient called requesting to speak with a nurse regarding her imaging results.

## 2021-10-10 NOTE — Telephone Encounter (Signed)
Spoke with the patient

## 2021-10-14 ENCOUNTER — Other Ambulatory Visit: Payer: Self-pay | Admitting: Physical Medicine and Rehabilitation

## 2021-10-22 ENCOUNTER — Inpatient Hospital Stay: Payer: Medicare Other

## 2021-10-22 ENCOUNTER — Other Ambulatory Visit: Payer: Self-pay

## 2021-10-22 DIAGNOSIS — E538 Deficiency of other specified B group vitamins: Secondary | ICD-10-CM | POA: Diagnosis not present

## 2021-10-22 DIAGNOSIS — D519 Vitamin B12 deficiency anemia, unspecified: Secondary | ICD-10-CM

## 2021-10-22 MED ORDER — CYANOCOBALAMIN 1000 MCG/ML IJ SOLN
1000.0000 ug | Freq: Once | INTRAMUSCULAR | Status: AC
  Administered 2021-10-22: 1000 ug via INTRAMUSCULAR
  Filled 2021-10-22: qty 1

## 2021-10-23 ENCOUNTER — Telehealth: Payer: Self-pay | Admitting: Nurse Practitioner

## 2021-10-23 NOTE — Telephone Encounter (Signed)
Inbound call from patient stating she would like to speak with the nurse, stated her medication is not working. Please advise.

## 2021-10-24 ENCOUNTER — Encounter
Payer: Medicare Other | Attending: Physical Medicine and Rehabilitation | Admitting: Physical Medicine and Rehabilitation

## 2021-10-24 DIAGNOSIS — K449 Diaphragmatic hernia without obstruction or gangrene: Secondary | ICD-10-CM | POA: Diagnosis not present

## 2021-10-24 DIAGNOSIS — K5903 Drug induced constipation: Secondary | ICD-10-CM | POA: Insufficient documentation

## 2021-10-24 DIAGNOSIS — R109 Unspecified abdominal pain: Secondary | ICD-10-CM | POA: Insufficient documentation

## 2021-10-24 MED ORDER — MILK OF MAGNESIA 7.75 % PO SUSP: 30.0000 mL | Freq: Every day | ORAL | 0 refills | Status: DC | PRN

## 2021-10-30 ENCOUNTER — Other Ambulatory Visit: Payer: Medicare Other | Admitting: Hospice

## 2021-10-30 DIAGNOSIS — E43 Unspecified severe protein-calorie malnutrition: Secondary | ICD-10-CM

## 2021-10-30 DIAGNOSIS — C7A8 Other malignant neuroendocrine tumors: Secondary | ICD-10-CM

## 2021-10-30 DIAGNOSIS — R269 Unspecified abnormalities of gait and mobility: Secondary | ICD-10-CM

## 2021-10-30 DIAGNOSIS — D519 Vitamin B12 deficiency anemia, unspecified: Secondary | ICD-10-CM

## 2021-10-30 DIAGNOSIS — G4700 Insomnia, unspecified: Secondary | ICD-10-CM

## 2021-10-30 DIAGNOSIS — K5901 Slow transit constipation: Secondary | ICD-10-CM

## 2021-10-30 DIAGNOSIS — G8929 Other chronic pain: Secondary | ICD-10-CM

## 2021-10-30 DIAGNOSIS — Z515 Encounter for palliative care: Secondary | ICD-10-CM
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
## 2021-11-03 ENCOUNTER — Other Ambulatory Visit: Payer: Self-pay

## 2021-11-03 ENCOUNTER — Encounter: Payer: Medicare Other | Attending: Physical Medicine and Rehabilitation | Admitting: Registered Nurse

## 2021-11-03 ENCOUNTER — Encounter: Payer: Self-pay | Admitting: Registered Nurse

## 2021-11-03 VITALS — BP 176/97 | HR 96 | Ht 63.0 in | Wt 85.8 lb

## 2021-11-03 DIAGNOSIS — R1084 Generalized abdominal pain: Secondary | ICD-10-CM | POA: Diagnosis present

## 2021-11-03 DIAGNOSIS — Z5181 Encounter for therapeutic drug level monitoring: Secondary | ICD-10-CM | POA: Diagnosis present

## 2021-11-03 DIAGNOSIS — G894 Chronic pain syndrome: Secondary | ICD-10-CM | POA: Insufficient documentation

## 2021-11-03 DIAGNOSIS — I10 Essential (primary) hypertension: Secondary | ICD-10-CM | POA: Insufficient documentation

## 2021-11-03 DIAGNOSIS — Z79891 Long term (current) use of opiate analgesic: Secondary | ICD-10-CM | POA: Diagnosis present

## 2021-11-03 DIAGNOSIS — D519 Vitamin B12 deficiency anemia, unspecified: Secondary | ICD-10-CM

## 2021-11-03 DIAGNOSIS — C7A8 Other malignant neuroendocrine tumors: Secondary | ICD-10-CM

## 2021-11-03 MED ORDER — OXYCODONE-ACETAMINOPHEN 7.5-325 MG PO TABS: 1.0000 | ORAL_TABLET | Freq: Two times a day (BID) | ORAL | 0 refills | Status: DC | PRN
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
## 2021-11-05 ENCOUNTER — Inpatient Hospital Stay: Payer: Medicare Other | Attending: Hematology

## 2021-11-05 ENCOUNTER — Other Ambulatory Visit: Payer: Self-pay

## 2021-11-05 ENCOUNTER — Inpatient Hospital Stay: Payer: Medicare Other

## 2021-11-05 ENCOUNTER — Other Ambulatory Visit: Payer: Self-pay | Admitting: Nurse Practitioner

## 2021-11-05 DIAGNOSIS — E538 Deficiency of other specified B group vitamins: Secondary | ICD-10-CM

## 2021-11-05 DIAGNOSIS — Z79899 Other long term (current) drug therapy: Secondary | ICD-10-CM | POA: Diagnosis not present

## 2021-11-05 DIAGNOSIS — C7A8 Other malignant neuroendocrine tumors: Secondary | ICD-10-CM

## 2021-11-05 DIAGNOSIS — D519 Vitamin B12 deficiency anemia, unspecified: Secondary | ICD-10-CM

## 2021-11-05 DIAGNOSIS — E611 Iron deficiency: Secondary | ICD-10-CM | POA: Insufficient documentation

## 2021-11-05 DIAGNOSIS — D508 Other iron deficiency anemias: Secondary | ICD-10-CM

## 2021-11-05 LAB — CBC WITH DIFFERENTIAL (CANCER CENTER ONLY)
Abs Immature Granulocytes: 0.01 K/uL (ref 0.00–0.07)
Basophils Absolute: 0 K/uL (ref 0.0–0.1)
Basophils Relative: 1 %
Eosinophils Absolute: 0 K/uL (ref 0.0–0.5)
Eosinophils Relative: 1 %
HCT: 40 % (ref 36.0–46.0)
Hemoglobin: 12.8 g/dL (ref 12.0–15.0)
Immature Granulocytes: 0 %
Lymphocytes Relative: 33 %
Lymphs Abs: 2.1 K/uL (ref 0.7–4.0)
MCH: 24.1 pg — ABNORMAL LOW (ref 26.0–34.0)
MCHC: 32 g/dL (ref 30.0–36.0)
MCV: 75.2 fL — ABNORMAL LOW (ref 80.0–100.0)
Monocytes Absolute: 0.4 K/uL (ref 0.1–1.0)
Monocytes Relative: 6 %
Neutro Abs: 3.7 K/uL (ref 1.7–7.7)
Neutrophils Relative %: 59 %
Platelet Count: 235 K/uL (ref 150–400)
RBC: 5.32 MIL/uL — ABNORMAL HIGH (ref 3.87–5.11)
RDW: 14.6 % (ref 11.5–15.5)
WBC Count: 6.2 K/uL (ref 4.0–10.5)
nRBC: 0 % (ref 0.0–0.2)

## 2021-11-05 LAB — CMP (CANCER CENTER ONLY)
ALT: 21 U/L (ref 0–44)
AST: 38 U/L (ref 15–41)
Albumin: 4 g/dL (ref 3.5–5.0)
Alkaline Phosphatase: 99 U/L (ref 38–126)
Anion gap: 7 (ref 5–15)
BUN: 17 mg/dL (ref 8–23)
CO2: 26 mmol/L (ref 22–32)
Calcium: 8.7 mg/dL — ABNORMAL LOW (ref 8.9–10.3)
Chloride: 106 mmol/L (ref 98–111)
Creatinine: 1.59 mg/dL — ABNORMAL HIGH (ref 0.44–1.00)
GFR, Estimated: 32 mL/min — ABNORMAL LOW (ref >60)
Glucose, Bld: 91 mg/dL (ref 70–99)
Potassium: 4 mmol/L (ref 3.5–5.1)
Sodium: 139 mmol/L (ref 135–145)
Total Bilirubin: 0.4 mg/dL (ref 0.3–1.2)
Total Protein: 7.5 g/dL (ref 6.5–8.1)

## 2021-11-05 LAB — FERRITIN: Ferritin: 139 ng/mL (ref 11–307)

## 2021-11-05 MED ORDER — CYANOCOBALAMIN 1000 MCG/ML IJ SOLN
1000.0000 ug | Freq: Once | INTRAMUSCULAR | Status: AC
  Administered 2021-11-05: 1000 ug via INTRAMUSCULAR
  Filled 2021-11-05: qty 1

## 2021-11-05 NOTE — Patient Instructions (Signed)
Vitamin B12 Deficiency Vitamin B12 deficiency occurs when the body does not have enough of this important vitamin. The body needs this vitamin: To make red blood cells. To make DNA. This is the genetic material inside cells. To help the nerves work properly so they can carry messages from the brain to the body. Vitamin B12 deficiency can cause health problems, such as not having enough red blood cells in the blood (anemia). This can lead to nerve damage if untreated. What are the causes? This condition may be caused by: Not eating enough foods that contain vitamin B12. Not having enough stomach acid and digestive fluids to properly absorb vitamin B12 from the food that you eat. Having certain diseases that make it hard to absorb vitamin B12. These diseases include Crohn's disease, chronic pancreatitis, and cystic fibrosis. An autoimmune disorder in which the body does not make enough of a protein (intrinsic factor) within the stomach, resulting in not enough absorption of vitamin B12. Having a surgery in which part of the stomach or small intestine is removed. Taking certain medicines that make it hard for the body to absorb vitamin B12. These include: Heartburn medicines, such as antacids and proton pump inhibitors. Some medicines that are used to treat diabetes. What increases the risk? The following factors may make you more likely to develop a vitamin B12 deficiency: Being an older adult. Eating a vegetarian or vegan diet that does not include any foods that come from animals. Eating a poor diet while you are pregnant. Taking certain medicines. Having alcoholism. What are the signs or symptoms? In some cases, there are no symptoms of this condition. If the condition leads to anemia or nerve damage, various symptoms may occur, such as: Weakness. Tiredness (fatigue). Loss of appetite. Numbness or tingling in your hands and feet. Redness and burning of the tongue. Depression,  confusion, or memory problems. Trouble walking. If anemia is severe, symptoms can include: Shortness of breath. Dizziness. Rapid heart rate. How is this diagnosed? This condition may be diagnosed with a blood test to measure the level of vitamin B12 in your blood. You may also have other tests, including: A group of tests that measure certain characteristics of blood cells (complete blood count, CBC). A blood test to measure intrinsic factor. A procedure where a thin tube with a camera on the end is used to look into your stomach or intestines (endoscopy). Other tests may be needed to discover the cause of the deficiency. How is this treated? Treatment for this condition depends on the cause. This condition may be treated by: Changing your eating and drinking habits, such as: Eating more foods that contain vitamin B12. Drinking less alcohol or no alcohol. Getting vitamin B12 injections. Taking vitamin B12 supplements by mouth (orally). Your health care provider will tell you which dose is best for you. Follow these instructions at home: Eating and drinking  Include foods in your diet that come from animals and contain a lot of vitamin B12. These include: Meats and poultry. This includes beef, pork, chicken, turkey, and organ meats, such as liver. Seafood. This includes clams, rainbow trout, salmon, tuna, and haddock. Eggs. Dairy foods such as milk, yogurt, and cheese. Eat foods that have vitamin B12 added to them (are fortified), such as ready-to-eat breakfast cereals. Check the label on the package to see if a food is fortified. The items listed above may not be a complete list of foods and beverages you can eat and drink. Contact a dietitian for   more information. Alcohol use Do not drink alcohol if: Your health care provider tells you not to drink. You are pregnant, may be pregnant, or are planning to become pregnant. If you drink alcohol: Limit how much you have to: 0-1 drink a  day for women. 0-2 drinks a day for men. Know how much alcohol is in your drink. In the [place] one drink equals one 12 oz bottle of beer (355 mL), one 5 oz glass of wine (148 mL), or one 1 oz glass of hard liquor (44 mL). General instructions Get vitamin B12 injections if told to by your health care provider. Take supplements only as told by your health care provider. Follow the directions carefully. Keep all follow-up visits. This is important. Contact a health care provider if: Your symptoms come back. Your symptoms get worse or do not improve with treatment. Get help right away: You develop shortness of breath. You have a rapid heart rate. You have chest pain. You become dizzy or you faint. These symptoms may be an emergency. Get help right away. Call 911. Do not wait to see if the symptoms will go away. Do not drive yourself to the hospital. Summary Vitamin B12 deficiency occurs when the body does not have enough of this important vitamin. Common causes include not eating enough foods that contain vitamin B12, not being able to absorb vitamin B12 from the food that you eat, having a surgery in which part of the stomach or small intestine is removed, or taking certain medicines. Eat foods that have vitamin B12 in them. Treatment may include making a change in the way you eat and drink, getting vitamin B12 injections, or taking vitamin B12 supplements. This information is not intended to replace advice given to you by your health care provider. Make sure you discuss any questions you have with your health care provider. Document Revised: 12/13/2020 Document Reviewed: 12/13/2020 Elsevier Patient Education  2023 [employer] Inc.  

## 2021-11-07 ENCOUNTER — Telehealth: Payer: Self-pay | Admitting: Gastroenterology

## 2021-11-07 NOTE — Telephone Encounter (Signed)
Inbound call from patient requesting to speak with a nurse in regards to her recent weight loss. Please give patient a call back to advise.  Thank you

## 2021-11-10 ENCOUNTER — Other Ambulatory Visit: Payer: Self-pay | Admitting: Gastroenterology

## 2021-11-10 ENCOUNTER — Emergency Department (HOSPITAL_COMMUNITY)
Admission: EM | Admit: 2021-11-10 | Discharge: 2021-11-11 | Disposition: A | Discharge status: Home / Self Care | Payer: Medicare Other | Attending: Emergency Medicine | Admitting: Emergency Medicine

## 2021-11-10 ENCOUNTER — Other Ambulatory Visit: Payer: Self-pay

## 2021-11-10 ENCOUNTER — Encounter (HOSPITAL_COMMUNITY): Payer: Self-pay | Admitting: Emergency Medicine

## 2021-11-10 ENCOUNTER — Telehealth: Payer: Self-pay | Admitting: *Deleted

## 2021-11-10 DIAGNOSIS — R1084 Generalized abdominal pain: Secondary | ICD-10-CM | POA: Insufficient documentation

## 2021-11-10 DIAGNOSIS — J189 Pneumonia, unspecified organism: Secondary | ICD-10-CM

## 2021-11-10 DIAGNOSIS — I10 Essential (primary) hypertension: Secondary | ICD-10-CM | POA: Diagnosis not present

## 2021-11-10 DIAGNOSIS — I2510 Atherosclerotic heart disease of native coronary artery without angina pectoris: Secondary | ICD-10-CM | POA: Diagnosis not present

## 2021-11-10 DIAGNOSIS — J181 Lobar pneumonia, unspecified organism: Secondary | ICD-10-CM | POA: Insufficient documentation

## 2021-11-10 DIAGNOSIS — R1013 Epigastric pain: Secondary | ICD-10-CM

## 2021-11-10 LAB — URINALYSIS, ROUTINE W REFLEX MICROSCOPIC
Bacteria, UA: NONE SEEN
Bilirubin Urine: NEGATIVE
Glucose, UA: NEGATIVE mg/dL
Hgb urine dipstick: NEGATIVE
Ketones, ur: NEGATIVE mg/dL
Leukocytes,Ua: NEGATIVE
Nitrite: NEGATIVE
Protein, ur: 30 mg/dL — AB
Specific Gravity, Urine: 1.023 (ref 1.005–1.030)
pH: 6 (ref 5.0–8.0)

## 2021-11-10 LAB — CBC WITH DIFFERENTIAL/PLATELET
Abs Immature Granulocytes: 0.01 10*3/uL (ref 0.00–0.07)
Basophils Absolute: 0 10*3/uL (ref 0.0–0.1)
Basophils Relative: 1 %
Eosinophils Absolute: 0 10*3/uL (ref 0.0–0.5)
Eosinophils Relative: 1 %
HCT: 41.9 % (ref 36.0–46.0)
Hemoglobin: 13.6 g/dL (ref 12.0–15.0)
Immature Granulocytes: 0 %
Lymphocytes Relative: 29 %
Lymphs Abs: 2.2 10*3/uL (ref 0.7–4.0)
MCH: 24.2 pg — ABNORMAL LOW (ref 26.0–34.0)
MCHC: 32.5 g/dL (ref 30.0–36.0)
MCV: 74.7 fL — ABNORMAL LOW (ref 80.0–100.0)
Monocytes Absolute: 0.7 10*3/uL (ref 0.1–1.0)
Monocytes Relative: 9 %
Neutro Abs: 4.6 10*3/uL (ref 1.7–7.7)
Neutrophils Relative %: 60 %
Platelets: 202 10*3/uL (ref 150–400)
RBC: 5.61 MIL/uL — ABNORMAL HIGH (ref 3.87–5.11)
RDW: 14.6 % (ref 11.5–15.5)
WBC: 7.6 10*3/uL (ref 4.0–10.5)
nRBC: 0 % (ref 0.0–0.2)

## 2021-11-10 MED ORDER — ONDANSETRON HCL 4 MG/2ML IJ SOLN
4.0000 mg | Freq: Once | INTRAMUSCULAR | Status: AC
  Administered 2021-11-11: 4 mg via INTRAVENOUS
  Filled 2021-11-10: qty 2

## 2021-11-10 MED ORDER — FENTANYL CITRATE PF 50 MCG/ML IJ SOSY
50.0000 ug | PREFILLED_SYRINGE | Freq: Once | INTRAMUSCULAR | Status: AC
  Administered 2021-11-11: 50 ug via INTRAVENOUS
  Filled 2021-11-10: qty 1

## 2021-11-10 MED ORDER — SODIUM CHLORIDE 0.9 % IV BOLUS
500.0000 mL | Freq: Once | INTRAVENOUS | Status: AC
  Administered 2021-11-11: 500 mL via INTRAVENOUS

## 2021-11-10 NOTE — ED Provider Triage Note (Signed)
Emergency Medicine Provider Triage Evaluation Note  Emily [name] , a 81 y.o. female  was evaluated in triage.  Pt complains of constipation, taking laxitive with some relief. Symptoms worse since yesterday. Last stool was yesterday, passed a lot of hard, large stool. Pain left side abdomen radiates across abdomen. Nauseous today.   Review of Systems  Positive: Constipation, left side abdominal pain, nausea Negative: vomiting  Physical Exam  BP (!) 122/58 (BP Location: Left Arm)   Pulse 88   Temp 98.5 F (36.9 C) (Oral)   Resp 16   SpO2 100%  Gen:   Awake, no distress   Resp:  Normal effort  MSK:   Moves extremities without difficulty  Other:    Medical Decision Making  Medically screening exam initiated at 5:17 PM.  Appropriate orders placed.  Emily [name] was informed that the remainder of the evaluation will be completed by another provider, this initial triage assessment does not replace that evaluation, and the importance of remaining in the ED until their evaluation is complete.     Tacy Learn, PA-C 11/10/21 1719

## 2021-11-10 NOTE — ED Provider Notes (Signed)
[facility] DEPT Provider Note   CSN: [id number] Arrival date & time: 11/10/21  1703     History {Add pertinent medical, surgical, social history, OB history to HPI:1} No chief complaint on file.   Emily [name] is a 81 y.o. female.  The history is provided by the patient and medical records.  Emily [name] is a 81 y.o. female who presents to the Emergency Department complaining of *** Stomach problems for years Yesterday could not have a BM Took epsom salt and hot water yesterday - had a very large BM.  Had vomiting 2-3 episodes yesterday.  Had associated upper abdominal pain.  Overall pain is decreased.  Has nausea but vomiting is resolved.   No fever, dysuria.  Had some chest pain earlier today.  Mild sob today.    Has hx/o HTN, gastrectomy    Home Medications Prior to Admission medications   Medication Sig Start Date End Date Taking? Authorizing Provider  acetaminophen (TYLENOL) 500 MG tablet Take 500 mg by mouth every 8 (eight) hours as needed (for pain.).    [provider]  Alum & Mag Hydroxide-Simeth (GI COCKTAIL) SUSP suspension 90 ML 2% Viscous lidocain  Take 10 ML's every 8 hours as needed  90ML Dicyclomine '10mg'$ /82m 270 ML Maalox '400mg'$  08/14/21   GWillia Craze NP  AMBULATORY NON FORMULARY MEDICATION [facility]: [facility]   For s/p multiple abdominal surgerys chronic abdominal and back pain 07/24/20   NMauri Pole MD  amitriptyline (ELAVIL) 10 MG tablet Take 1 tablet (10 mg total) by mouth at bedtime. 06/27/21   Raulkar, KClide Deutscher MD  ammonium lactate (AMLACTIN) 12 % cream APPLY EXTERNALLY TO THE AFFECTED AREA AS NEEDED FOR DRY SKIN 03/27/20   MGardiner Barefoot DPM  clotrimazole-betamethasone (LOTRISONE) cream Apply 1 application topically 2 (two) times daily as needed (rash).  02/07/18   [provider]  cyanocobalamin (,VITAMIN B-12,) 1000 MCG/ML injection Inject 1 mL (1,000 mcg total) into the  muscle every 14 (fourteen) days. 12/27/18   FTruitt Merle MD  dexlansoprazole (DEXILANT) 60 MG capsule Take 1 capsule (60 mg total) by mouth daily. 10/10/21   NMauri Pole MD  dicyclomine (BENTYL) 10 MG capsule TAKE 1 CAPSULE BY MOUTH EVERY 8 HOURS AS NEEDED FOR  SPASMS 11/10/21   NMauri Pole MD  diphenhydrAMINE (BENADRYL) 12.5 MG chewable tablet Chew 1 tablet (12.5 mg total) by mouth 4 (four) times daily as needed for allergies. 05/22/21   Raulkar, KClide Deutscher MD  hydrOXYzine (ATARAX) 50 MG tablet Take 50 mg by mouth daily. 08/09/21   [provider]  lidocaine (LIDODERM) 5 % USE 1 PATCH EXTERNALLY ONE DAILY REMOVE AND DISCARD WITHIN 12 HOURS. 05/21/21   NMauri Pole MD  LINZESS 145 MCG CAPS capsule TAKE 1 CAPSULE(145 MCG) BY MOUTH DAILY BEFORE BREAKFAST 06/16/21   NMauri Pole MD  loperamide (IMODIUM) 2 MG capsule Take 1 capsule (2 mg total) by mouth 4 (four) times daily as needed for diarrhea or loose stools. 02/20/21   Raulkar, KClide Deutscher MD  losartan (COZAAR) 25 MG tablet Take 25 mg by mouth daily. 05/22/20   [provider]  magnesium hydroxide (MILK OF MAGNESIA) 400 MG/5ML suspension Take 30 mLs by mouth daily as needed for mild constipation. 10/24/21   Raulkar, KClide Deutscher MD  meclizine (ANTIVERT) 12.5 MG tablet TAKE 1 TABLET(12.5 MG) BY MOUTH THREE TIMES DAILY AS NEEDED FOR DIZZINESS 10/14/21   Raulkar, KClide Deutscher MD  megestrol (MEGACE) 40 MG/ML suspension SHAKE LIQUID AND TAKE 10 ML(400 MG) BY MOUTH DAILY 06/24/21   Raulkar, Clide Deutscher, MD  Melatonin 3 MG CAPS Take 1 capsule (3 mg total) by mouth at bedtime. 06/23/21   Raulkar, Clide Deutscher, MD  metaxalone (SKELAXIN) 800 MG tablet Take 0.5 tablets (400 mg total) by mouth 3 (three) times daily as needed for muscle spasms. 03/14/21   Raulkar, Clide Deutscher, MD  mirtazapine (REMERON) 30 MG tablet Take 30 mg by mouth at bedtime. 06/20/21   [provider]  oxybutynin (DITROPAN XL) 5 MG 24 hr tablet Take 1 tablet  (5 mg total) by mouth at bedtime. 05/22/21   Raulkar, Clide Deutscher, MD  oxyCODONE-acetaminophen (PERCOCET) 7.5-325 MG tablet Take 1 tablet by mouth 2 (two) times daily as needed for moderate pain. 11/03/21 11/03/22  Bayard Hugger, NP  promethazine (PHENERGAN) 12.5 MG tablet TAKE 1 TABLET(12.5 MG) BY MOUTH EVERY 4 HOURS AS NEEDED FOR NAUSEA OR VOMITING 08/18/21   Raulkar, Clide Deutscher, MD  RESTASIS 0.05 % ophthalmic emulsion Place 1 drop into both eyes 2 (two) times daily. 04/02/16   [provider]      Allergies    Patient has no known allergies.    Review of Systems   Review of Systems  All other systems reviewed and are negative.   Physical Exam Updated Vital Signs BP (!) 166/90   Pulse 67   Temp 98.5 F (36.9 C) (Oral)   Resp 17   SpO2 99%  Physical Exam Vitals and nursing note reviewed.  Constitutional:      Appearance: She is well-developed.  HENT:     Head: Normocephalic and atraumatic.  Cardiovascular:     Rate and Rhythm: Normal rate and regular rhythm.     Heart sounds: No murmur heard. Pulmonary:     Effort: Pulmonary effort is normal. No respiratory distress.     Breath sounds: Normal breath sounds.  Abdominal:     Palpations: Abdomen is soft.     Tenderness: There is abdominal tenderness. There is no guarding or rebound.     Comments: Moderate generalized abdominal tenderness  Musculoskeletal:        General: No swelling or tenderness.  Skin:    General: Skin is warm and dry.  Neurological:     Mental Status: She is alert and oriented to person, place, and time.  Psychiatric:        Behavior: Behavior normal.     ED Results / Procedures / Treatments   Labs (all labs ordered are listed, but only abnormal results are displayed) Labs Reviewed  URINALYSIS, ROUTINE W REFLEX MICROSCOPIC - Abnormal; Notable for the following components:      Result Value   Protein, ur 30 (*)    All other components within normal limits  CBC WITH DIFFERENTIAL/PLATELET   COMPREHENSIVE METABOLIC PANEL  LIPASE, BLOOD    EKG None  Radiology No results found.  Procedures Procedures  {Document cardiac monitor, telemetry assessment procedure when appropriate:1}  Medications Ordered in ED Medications - No data to display  ED Course/ Medical Decision Making/ A&P                           Medical Decision Making  ***  {Document critical care time when appropriate:1} {Document review of labs and clinical decision tools ie heart score, Chads2Vasc2 etc:1}  {Document your independent review of radiology images, and any outside records:1} {Document  your discussion with family members, caretakers, and with consultants:1} {Document social determinants of health affecting pt's care:1} {Document your decision making why or why not admission, treatments were needed:1} Final Clinical Impression(s) / ED Diagnoses Final diagnoses:  None    Rx / DC Orders ED Discharge Orders     None

## 2021-11-10 NOTE — Telephone Encounter (Signed)
Weights 86 pounds and wonders what Dr Silverio Decamp can do about this? Having a lot of trouble with her bowels as well. Considered going to ED last night.Is drinking plenty of water.

## 2021-11-10 NOTE — ED Triage Notes (Signed)
BIBA Per EMS: Pt coming from home w/ constipation x 1 day. Denies any OTC meds.  142/88 84HR 16RR 122 CBG  98% RA

## 2021-11-10 NOTE — Telephone Encounter (Signed)
Emily [name] called to report that she did take the MOM and did have a large bowel movement yesterday that was hard and she had a lot of abdominal pain and she felt very bad all day. She had a smaller loose bm this morning. She is still concerned and I advised her to see her PCP to discuss or if she continues to feel bad to go to the ED to be evaluated for impaction.  She would like Dr [name] to call her.

## 2021-11-10 NOTE — ED Notes (Signed)
Stuck pt x2 unable to collect.

## 2021-11-11 ENCOUNTER — Other Ambulatory Visit: Payer: Self-pay

## 2021-11-11 ENCOUNTER — Emergency Department (HOSPITAL_COMMUNITY): Payer: Medicare Other

## 2021-11-11 ENCOUNTER — Encounter (HOSPITAL_COMMUNITY): Payer: Self-pay

## 2021-11-11 DIAGNOSIS — R1084 Generalized abdominal pain: Secondary | ICD-10-CM | POA: Diagnosis not present

## 2021-11-11 DIAGNOSIS — E538 Deficiency of other specified B group vitamins: Secondary | ICD-10-CM | POA: Diagnosis not present

## 2021-11-11 LAB — COMPREHENSIVE METABOLIC PANEL
ALT: 16 U/L (ref 0–44)
AST: 57 U/L — ABNORMAL HIGH (ref 15–41)
Albumin: 3.8 g/dL (ref 3.5–5.0)
Alkaline Phosphatase: 101 U/L (ref 38–126)
Anion gap: 9 (ref 5–15)
BUN: 13 mg/dL (ref 8–23)
CO2: 24 mmol/L (ref 22–32)
Calcium: 9 mg/dL (ref 8.9–10.3)
Chloride: 106 mmol/L (ref 98–111)
Creatinine, Ser: 1.49 mg/dL — ABNORMAL HIGH (ref 0.44–1.00)
GFR, Estimated: 35 mL/min — ABNORMAL LOW (ref >60)
Glucose, Bld: 101 mg/dL — ABNORMAL HIGH (ref 70–99)
Potassium: 5.2 mmol/L — ABNORMAL HIGH (ref 3.5–5.1)
Sodium: 139 mmol/L (ref 135–145)
Total Bilirubin: 0.7 mg/dL (ref 0.3–1.2)
Total Protein: 7.5 g/dL (ref 6.5–8.1)

## 2021-11-11 LAB — TROPONIN I (HIGH SENSITIVITY): Troponin I (High Sensitivity): 5 ng/L (ref <18)

## 2021-11-11 LAB — LIPASE, BLOOD: Lipase: 35 U/L (ref 11–51)

## 2021-11-11 MED ORDER — SUCRALFATE 1 GM/10ML PO SUSP: 1.0000 g | Freq: Three times a day (TID) | ORAL | 0 refills | Status: DC

## 2021-11-11 MED ORDER — SODIUM CHLORIDE (PF) 0.9 % IJ SOLN
INTRAMUSCULAR | Status: DC
Start: 2021-11-11 — End: 2021-11-11
  Filled 2021-11-11: qty 50

## 2021-11-11 MED ORDER — DOXYCYCLINE HYCLATE 100 MG PO CAPS: 100.0000 mg | ORAL_CAPSULE | Freq: Two times a day (BID) | ORAL | 0 refills | Status: DC

## 2021-11-11 MED ORDER — IOHEXOL 300 MG/ML  SOLN
60.0000 mL | Freq: Once | INTRAMUSCULAR | Status: AC | PRN
  Administered 2021-11-11: 60 mL via INTRAVENOUS

## 2021-11-11 NOTE — Telephone Encounter (Signed)
Mailbox is full and cannot take any more messages.

## 2021-11-11 NOTE — Discharge Instructions (Signed)
Please follow-up closely with both your family doctor as well as your gastroenterologist.  Get rechecked if you have uncontrolled pain, worsening breathing or new concerning symptoms.  Please continue to take your medications as prescribed.

## 2021-11-19 ENCOUNTER — Inpatient Hospital Stay: Payer: Medicare Other

## 2021-11-19 ENCOUNTER — Other Ambulatory Visit: Payer: Self-pay

## 2021-11-19 DIAGNOSIS — D519 Vitamin B12 deficiency anemia, unspecified: Secondary | ICD-10-CM

## 2021-11-19 DIAGNOSIS — E538 Deficiency of other specified B group vitamins: Secondary | ICD-10-CM | POA: Diagnosis not present

## 2021-11-19 MED ORDER — CYANOCOBALAMIN 1000 MCG/ML IJ SOLN
1000.0000 ug | Freq: Once | INTRAMUSCULAR | Status: AC
  Administered 2021-11-19: 1000 ug via INTRAMUSCULAR
  Filled 2021-11-19: qty 1

## 2021-11-25 ENCOUNTER — Other Ambulatory Visit: Payer: Medicare Other | Admitting: Hospice

## 2021-11-25 DIAGNOSIS — E43 Unspecified severe protein-calorie malnutrition: Secondary | ICD-10-CM

## 2021-11-25 DIAGNOSIS — C7A8 Other malignant neuroendocrine tumors: Secondary | ICD-10-CM

## 2021-11-25 DIAGNOSIS — R269 Unspecified abnormalities of gait and mobility: Secondary | ICD-10-CM

## 2021-11-25 DIAGNOSIS — G8929 Other chronic pain: Secondary | ICD-10-CM

## 2021-11-25 DIAGNOSIS — G4700 Insomnia, unspecified: Secondary | ICD-10-CM

## 2021-11-25 DIAGNOSIS — E538 Deficiency of other specified B group vitamins: Secondary | ICD-10-CM

## 2021-11-25 DIAGNOSIS — D519 Vitamin B12 deficiency anemia, unspecified: Secondary | ICD-10-CM

## 2021-11-25 DIAGNOSIS — Z515 Encounter for palliative care: Secondary | ICD-10-CM
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
## 2021-12-01 ENCOUNTER — Other Ambulatory Visit: Payer: Self-pay

## 2021-12-02 ENCOUNTER — Encounter: Payer: Self-pay | Admitting: Gastroenterology

## 2021-12-02 ENCOUNTER — Ambulatory Visit (INDEPENDENT_AMBULATORY_CARE_PROVIDER_SITE_OTHER): Payer: Medicare Other | Admitting: Gastroenterology

## 2021-12-02 ENCOUNTER — Ambulatory Visit: Payer: Medicare Other | Admitting: Podiatry

## 2021-12-02 VITALS — BP 130/70 | HR 62 | Ht 63.0 in | Wt 84.2 lb

## 2021-12-02 DIAGNOSIS — R1084 Generalized abdominal pain: Secondary | ICD-10-CM | POA: Diagnosis not present

## 2021-12-02 DIAGNOSIS — R112 Nausea with vomiting, unspecified: Secondary | ICD-10-CM

## 2021-12-02 DIAGNOSIS — K5909 Other constipation: Secondary | ICD-10-CM | POA: Diagnosis not present

## 2021-12-02 NOTE — Patient Instructions (Signed)
Drink protein Boost, Breeze or Ensure twice a day  Eat small frequent meals   We will refer you to [facility]  Take Miralax 1 capful daily  Follow up as needed  If you are age 81 or older, your body mass index should be between 23-30. Your Body mass index is 14.92 kg/m. If this is out of the aforementioned range listed, please consider follow up with your Primary Care Provider.  If you are age 25 or younger, your body mass index should be between 19-25. Your Body mass index is 14.92 kg/m. If this is out of the aformentioned range listed, please consider follow up with your Primary Care Provider.   ________________________________________________________  The [facility] providers would like to encourage you to use [facility] to communicate with providers for non-urgent requests or questions.  Due to long hold times on the telephone, sending your provider a message by [facility] may be a faster and more efficient way to get a response.  Please allow 48 business hours for a response.  Please remember that this is for non-urgent requests.  _______________________________________________________   I appreciate the  opportunity to care for you  Thank You   Harl Bowie , MD
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
## 2021-12-03 ENCOUNTER — Inpatient Hospital Stay: Payer: Medicare Other

## 2021-12-03 ENCOUNTER — Inpatient Hospital Stay: Payer: Medicare Other | Attending: Hematology | Admitting: Hematology

## 2021-12-03 ENCOUNTER — Encounter: Payer: Self-pay | Admitting: Hematology

## 2021-12-03 ENCOUNTER — Telehealth: Payer: Self-pay | Admitting: *Deleted

## 2021-12-03 VITALS — BP 168/86 | HR 91 | Temp 97.5°F | Resp 16 | Wt 83.8 lb

## 2021-12-03 DIAGNOSIS — D519 Vitamin B12 deficiency anemia, unspecified: Secondary | ICD-10-CM

## 2021-12-03 DIAGNOSIS — C7A8 Other malignant neuroendocrine tumors: Secondary | ICD-10-CM | POA: Insufficient documentation

## 2021-12-03 DIAGNOSIS — E538 Deficiency of other specified B group vitamins: Secondary | ICD-10-CM

## 2021-12-03 DIAGNOSIS — D649 Anemia, unspecified: Secondary | ICD-10-CM

## 2021-12-03 LAB — CBC WITH DIFFERENTIAL (CANCER CENTER ONLY)
Abs Immature Granulocytes: 0.01 10*3/uL (ref 0.00–0.07)
Basophils Absolute: 0 10*3/uL (ref 0.0–0.1)
Basophils Relative: 0 %
Eosinophils Absolute: 0 10*3/uL (ref 0.0–0.5)
Eosinophils Relative: 0 %
HCT: 39.7 % (ref 36.0–46.0)
Hemoglobin: 13 g/dL (ref 12.0–15.0)
Immature Granulocytes: 0 %
Lymphocytes Relative: 25 %
Lymphs Abs: 1.7 10*3/uL (ref 0.7–4.0)
MCH: 24.2 pg — ABNORMAL LOW (ref 26.0–34.0)
MCHC: 32.7 g/dL (ref 30.0–36.0)
MCV: 73.8 fL — ABNORMAL LOW (ref 80.0–100.0)
Monocytes Absolute: 0.4 10*3/uL (ref 0.1–1.0)
Monocytes Relative: 6 %
Neutro Abs: 4.6 10*3/uL (ref 1.7–7.7)
Neutrophils Relative %: 69 %
Platelet Count: 209 10*3/uL (ref 150–400)
RBC: 5.38 MIL/uL — ABNORMAL HIGH (ref 3.87–5.11)
RDW: 14.5 % (ref 11.5–15.5)
WBC Count: 6.7 10*3/uL (ref 4.0–10.5)
nRBC: 0 % (ref 0.0–0.2)

## 2021-12-03 LAB — CMP (CANCER CENTER ONLY)
ALT: 25 U/L (ref 0–44)
AST: 36 U/L (ref 15–41)
Albumin: 3.9 g/dL (ref 3.5–5.0)
Alkaline Phosphatase: 99 U/L (ref 38–126)
Anion gap: 7 (ref 5–15)
BUN: 18 mg/dL (ref 8–23)
CO2: 24 mmol/L (ref 22–32)
Calcium: 9.1 mg/dL (ref 8.9–10.3)
Chloride: 106 mmol/L (ref 98–111)
Creatinine: 1.57 mg/dL — ABNORMAL HIGH (ref 0.44–1.00)
GFR, Estimated: 33 mL/min — ABNORMAL LOW (ref >60)
Glucose, Bld: 116 mg/dL — ABNORMAL HIGH (ref 70–99)
Potassium: 3.9 mmol/L (ref 3.5–5.1)
Sodium: 137 mmol/L (ref 135–145)
Total Bilirubin: <0.1 mg/dL — ABNORMAL LOW (ref 0.3–1.2)
Total Protein: 7.9 g/dL (ref 6.5–8.1)

## 2021-12-03 LAB — IRON AND IRON BINDING CAPACITY (CC-WL,HP ONLY)
Iron: 101 ug/dL (ref 28–170)
Saturation Ratios: 35 % — ABNORMAL HIGH (ref 10.4–31.8)
TIBC: 291 ug/dL (ref 250–450)
UIBC: 190 ug/dL

## 2021-12-03 LAB — VITAMIN B12: Vitamin B-12: 1170 pg/mL — ABNORMAL HIGH (ref 180–914)

## 2021-12-03 MED ORDER — CYANOCOBALAMIN 1000 MCG/ML IJ SOLN
1000.0000 ug | Freq: Once | INTRAMUSCULAR | Status: AC
  Administered 2021-12-03: 1000 ug via INTRAMUSCULAR
  Filled 2021-12-03: qty 1

## 2021-12-03 MED ORDER — PROCHLORPERAZINE 25 MG RE SUPP: 25.0000 mg | Freq: Two times a day (BID) | RECTAL | 0 refills | Status: DC | PRN

## 2021-12-03 NOTE — Patient Instructions (Signed)
Vitamin B12 Deficiency Vitamin B12 deficiency occurs when the body does not have enough of this important vitamin. The body needs this vitamin: To make red blood cells. To make DNA. This is the genetic material inside cells. To help the nerves work properly so they can carry messages from the brain to the body. Vitamin B12 deficiency can cause health problems, such as not having enough red blood cells in the blood (anemia). This can lead to nerve damage if untreated. What are the causes? This condition may be caused by: Not eating enough foods that contain vitamin B12. Not having enough stomach acid and digestive fluids to properly absorb vitamin B12 from the food that you eat. Having certain diseases that make it hard to absorb vitamin B12. These diseases include Crohn's disease, chronic pancreatitis, and cystic fibrosis. An autoimmune disorder in which the body does not make enough of a protein (intrinsic factor) within the stomach, resulting in not enough absorption of vitamin B12. Having a surgery in which part of the stomach or small intestine is removed. Taking certain medicines that make it hard for the body to absorb vitamin B12. These include: Heartburn medicines, such as antacids and proton pump inhibitors. Some medicines that are used to treat diabetes. What increases the risk? The following factors may make you more likely to develop a vitamin B12 deficiency: Being an older adult. Eating a vegetarian or vegan diet that does not include any foods that come from animals. Eating a poor diet while you are pregnant. Taking certain medicines. Having alcoholism. What are the signs or symptoms? In some cases, there are no symptoms of this condition. If the condition leads to anemia or nerve damage, various symptoms may occur, such as: Weakness. Tiredness (fatigue). Loss of appetite. Numbness or tingling in your hands and feet. Redness and burning of the tongue. Depression,  confusion, or memory problems. Trouble walking. If anemia is severe, symptoms can include: Shortness of breath. Dizziness. Rapid heart rate. How is this diagnosed? This condition may be diagnosed with a blood test to measure the level of vitamin B12 in your blood. You may also have other tests, including: A group of tests that measure certain characteristics of blood cells (complete blood count, CBC). A blood test to measure intrinsic factor. A procedure where a thin tube with a camera on the end is used to look into your stomach or intestines (endoscopy). Other tests may be needed to discover the cause of the deficiency. How is this treated? Treatment for this condition depends on the cause. This condition may be treated by: Changing your eating and drinking habits, such as: Eating more foods that contain vitamin B12. Drinking less alcohol or no alcohol. Getting vitamin B12 injections. Taking vitamin B12 supplements by mouth (orally). Your health care provider will tell you which dose is best for you. Follow these instructions at home: Eating and drinking  Include foods in your diet that come from animals and contain a lot of vitamin B12. These include: Meats and poultry. This includes beef, pork, chicken, turkey, and organ meats, such as liver. Seafood. This includes clams, rainbow trout, salmon, tuna, and haddock. Eggs. Dairy foods such as milk, yogurt, and cheese. Eat foods that have vitamin B12 added to them (are fortified), such as ready-to-eat breakfast cereals. Check the label on the package to see if a food is fortified. The items listed above may not be a complete list of foods and beverages you can eat and drink. Contact a dietitian for   more information. Alcohol use Do not drink alcohol if: Your health care provider tells you not to drink. You are pregnant, may be pregnant, or are planning to become pregnant. If you drink alcohol: Limit how much you have to: 0-1 drink a  day for women. 0-2 drinks a day for men. Know how much alcohol is in your drink. In the [place] one drink equals one 12 oz bottle of beer (355 mL), one 5 oz glass of wine (148 mL), or one 1 oz glass of hard liquor (44 mL). General instructions Get vitamin B12 injections if told to by your health care provider. Take supplements only as told by your health care provider. Follow the directions carefully. Keep all follow-up visits. This is important. Contact a health care provider if: Your symptoms come back. Your symptoms get worse or do not improve with treatment. Get help right away: You develop shortness of breath. You have a rapid heart rate. You have chest pain. You become dizzy or you faint. These symptoms may be an emergency. Get help right away. Call 911. Do not wait to see if the symptoms will go away. Do not drive yourself to the hospital. Summary Vitamin B12 deficiency occurs when the body does not have enough of this important vitamin. Common causes include not eating enough foods that contain vitamin B12, not being able to absorb vitamin B12 from the food that you eat, having a surgery in which part of the stomach or small intestine is removed, or taking certain medicines. Eat foods that have vitamin B12 in them. Treatment may include making a change in the way you eat and drink, getting vitamin B12 injections, or taking vitamin B12 supplements. This information is not intended to replace advice given to you by your health care provider. Make sure you discuss any questions you have with your health care provider. Document Revised: 12/13/2020 Document Reviewed: 12/13/2020 Elsevier Patient Education  2023 [employer] Inc.  

## 2021-12-03 NOTE — Telephone Encounter (Signed)
Dr Silverio Decamp Can you finish Charlye's note for referral ? Thank You
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
## 2021-12-04 ENCOUNTER — Encounter: Payer: Medicare Other | Attending: Physical Medicine and Rehabilitation | Admitting: Registered Nurse

## 2021-12-04 ENCOUNTER — Encounter: Payer: Self-pay | Admitting: Registered Nurse

## 2021-12-04 ENCOUNTER — Telehealth: Payer: Self-pay | Admitting: Hematology

## 2021-12-04 VITALS — BP 131/76 | HR 105 | Ht 63.0 in | Wt 86.0 lb

## 2021-12-04 DIAGNOSIS — Z5181 Encounter for therapeutic drug level monitoring: Secondary | ICD-10-CM | POA: Diagnosis present

## 2021-12-04 DIAGNOSIS — G894 Chronic pain syndrome: Secondary | ICD-10-CM | POA: Diagnosis present

## 2021-12-04 DIAGNOSIS — Z79891 Long term (current) use of opiate analgesic: Secondary | ICD-10-CM | POA: Insufficient documentation

## 2021-12-04 DIAGNOSIS — M545 Low back pain, unspecified: Secondary | ICD-10-CM | POA: Diagnosis present

## 2021-12-04 DIAGNOSIS — G8929 Other chronic pain: Secondary | ICD-10-CM | POA: Diagnosis present

## 2021-12-04 LAB — FERRITIN: Ferritin: 165 ng/mL (ref 11–307)

## 2021-12-04 MED ORDER — OXYCODONE-ACETAMINOPHEN 7.5-325 MG PO TABS: 1.0000 | ORAL_TABLET | Freq: Two times a day (BID) | ORAL | 0 refills | Status: DC | PRN
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
## 2021-12-04 NOTE — Telephone Encounter (Signed)
Faxed today

## 2021-12-04 NOTE — Telephone Encounter (Signed)
Done, thanks

## 2021-12-04 NOTE — Telephone Encounter (Signed)
Scheduled follow-up appointments per 8/2 los. Patient is aware. Mailed calendar.

## 2021-12-05 ENCOUNTER — Telehealth: Payer: Self-pay | Admitting: Physical Medicine and Rehabilitation

## 2021-12-05 LAB — METHYLMALONIC ACID, SERUM: Methylmalonic Acid, Quantitative: 211 nmol/L (ref 0–378)

## 2021-12-05 IMAGING — CT CT ABD-PELV W/ CM
2 of 5 series · 15 of 46 positions shown, 17 images · IV contrast (omnipaque)
Comparison: 08/31/2018

CLINICAL DATA: Malignant neuroendocrine tumor of the appendix.
Surveillance.

EXAM:
CT ABDOMEN AND PELVIS WITH CONTRAST
TECHNIQUE: Multidetector CT imaging of the abdomen and pelvis was performed
using the standard protocol following bolus administration of
intravenous contrast.
CONTRAST:  75mL OMNIPAQUE IOHEXOL 300 MG/ML  SOLN

[Series 2: axial st · axial · 0.64mm/px · z∈[-403,-53]mm · 12 of 80 slices shown, 14 images]
[im 5/80  soft-tissue]
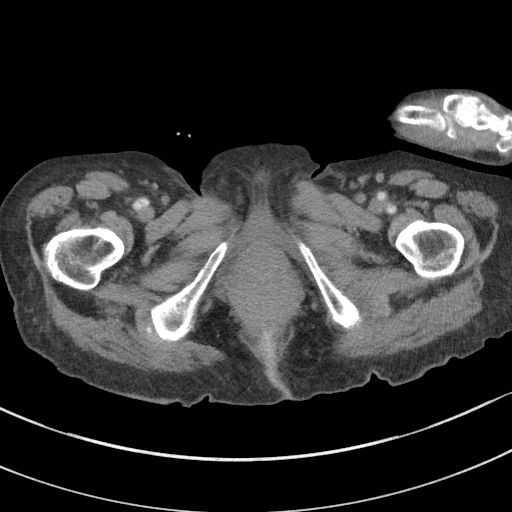
[im 5/80  bone]
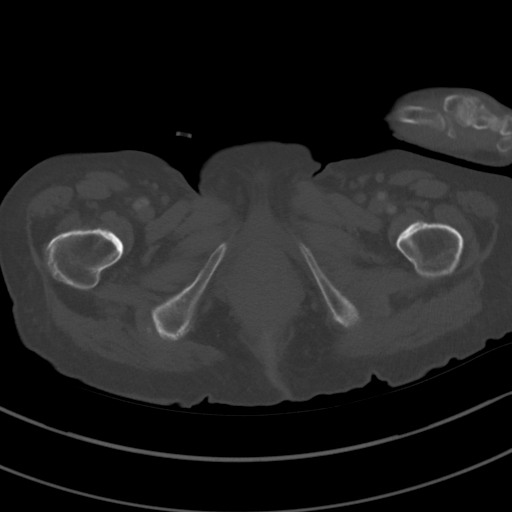
[im 10/80  soft-tissue]
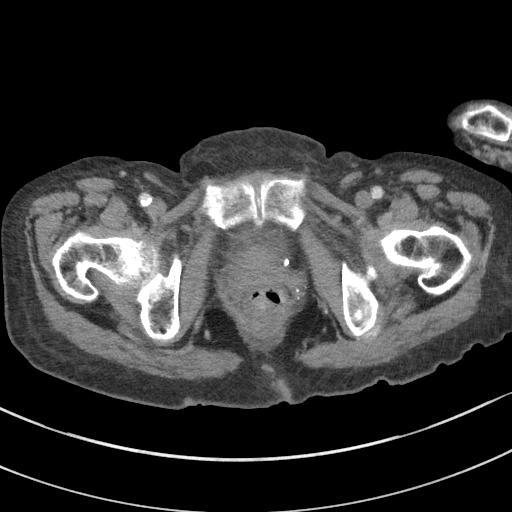
[im 20/80  soft-tissue]
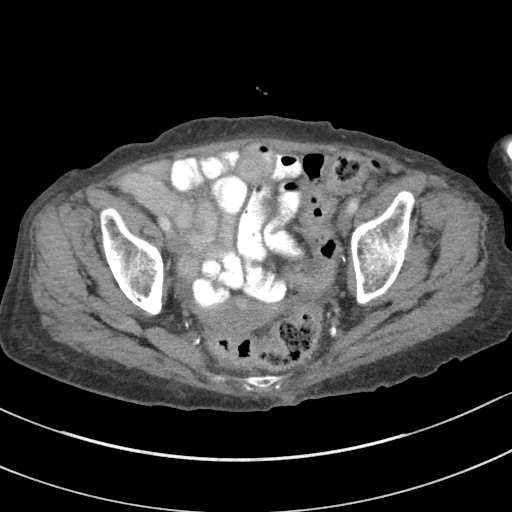
[im 25/80  soft-tissue]
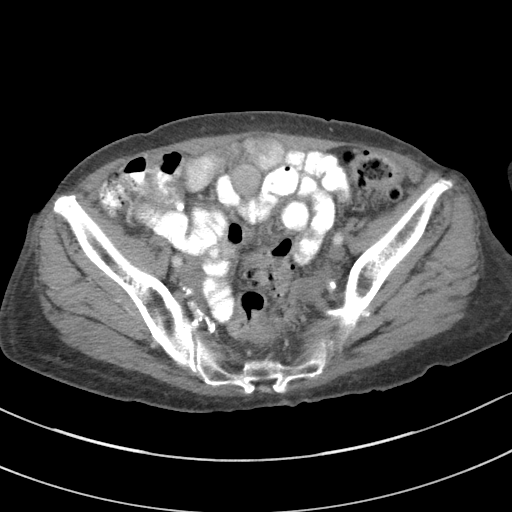
[im 30/80  soft-tissue]
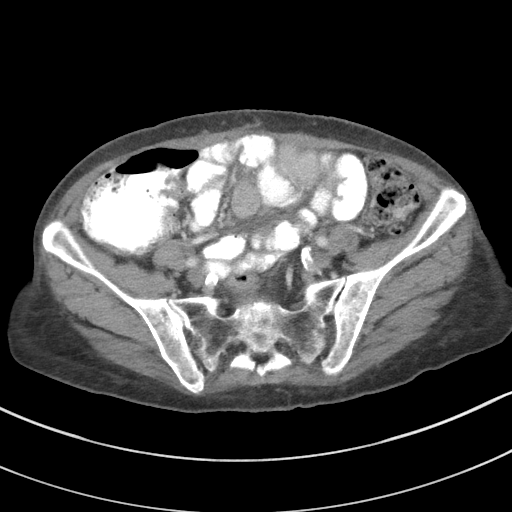
[im 35/80  soft-tissue]
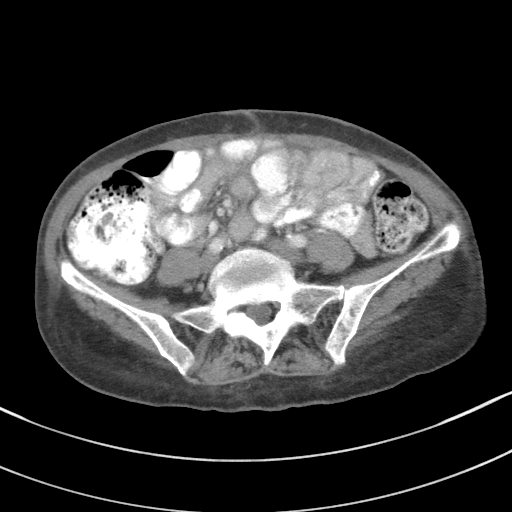
[im 45/80  soft-tissue]
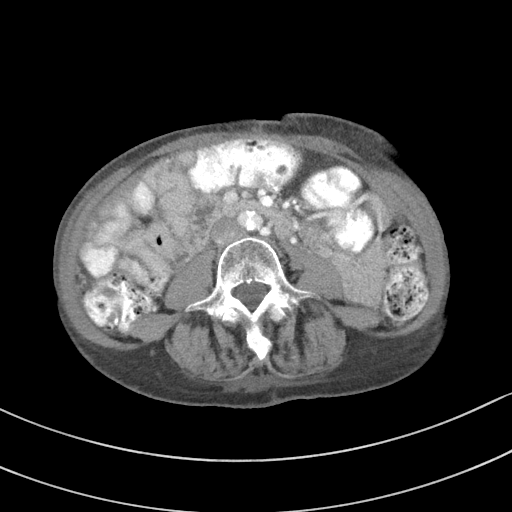
[im 50/80  soft-tissue]
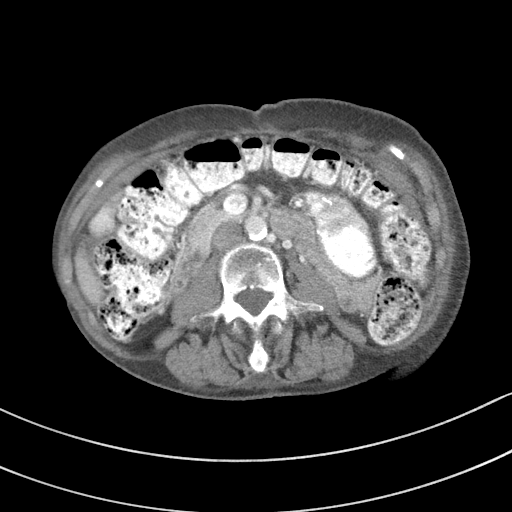
[im 55/80  soft-tissue]
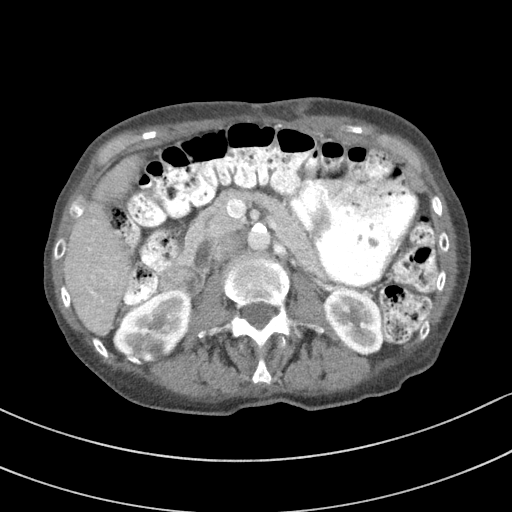
[im 55/80  bone]
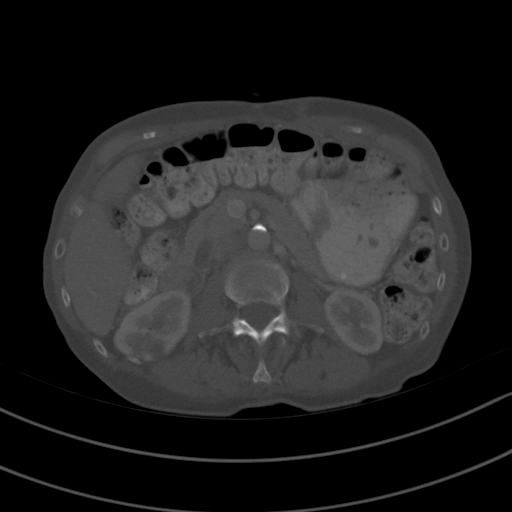
[im 60/80  soft-tissue]
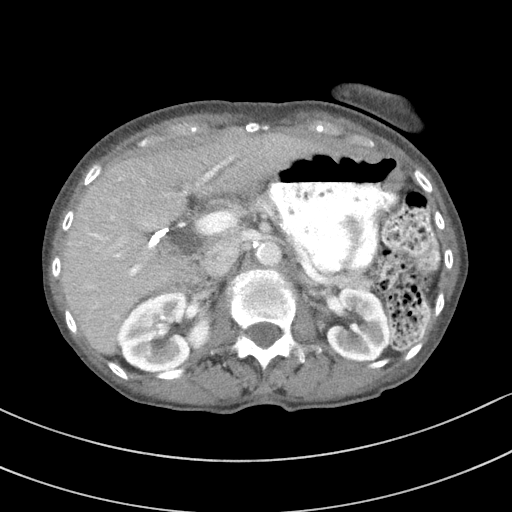
[im 70/80  soft-tissue]
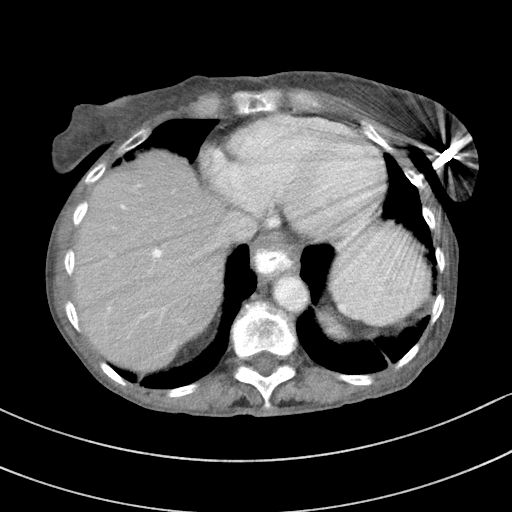
[im 75/80  soft-tissue]
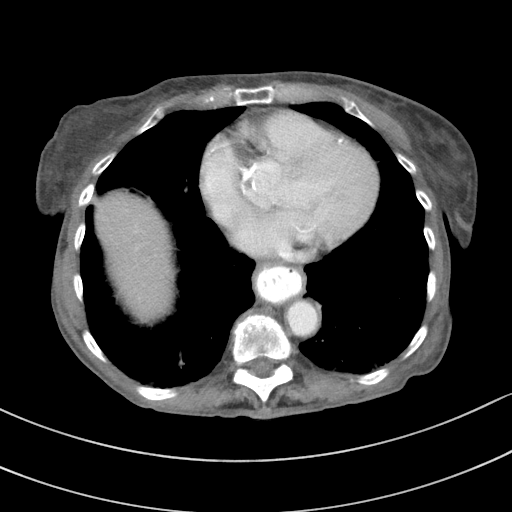

[Series 4: coronal st · coronal · 0.67mm/px · 3 of 76 slices shown]
[im 26/76  soft-tissue]
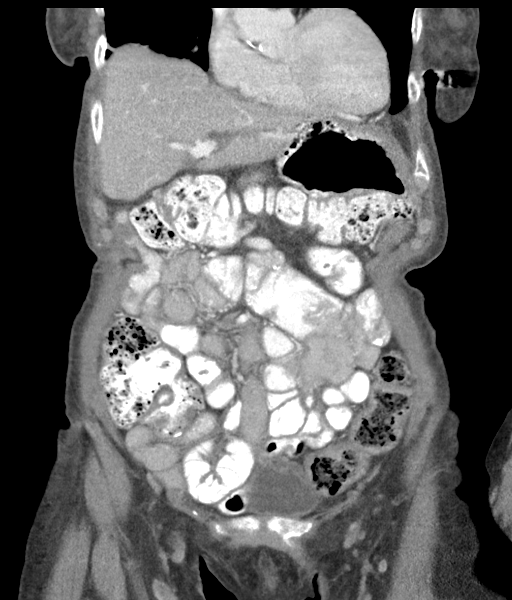
[im 34/76  soft-tissue]
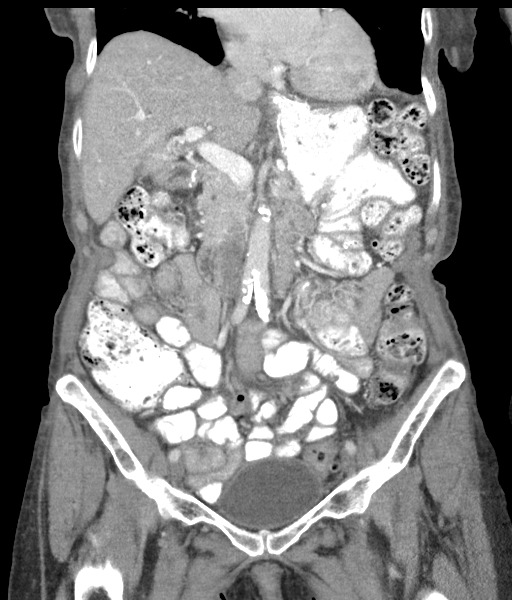
[im 42/76  soft-tissue]
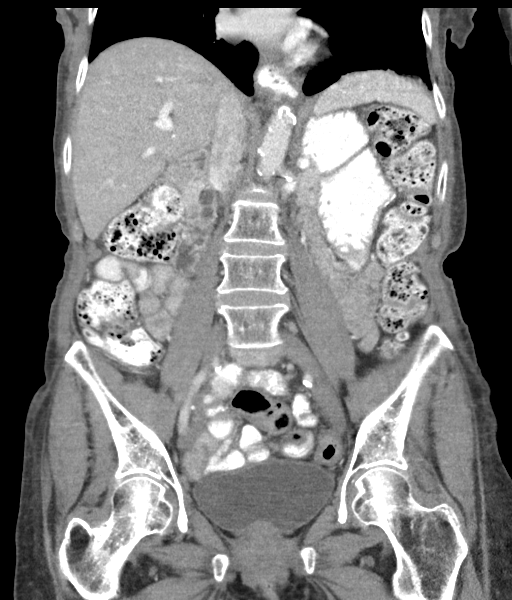

[15 of 46 positions shown; findings below may reference images not displayed]

FINDINGS: Lower chest: Chest bronchial wall thickening with bronchiectasis
noted in both lung bases. Stable 4 mm right lower lobe pulmonary
nodule on image 1 of series 6.

Hepatobiliary: No suspicious focal abnormality within the liver
parenchyma. Intra and extrahepatic biliary duct dilatation again
noted. Common bile duct in the head of pancreas measures 9 mm
diameter, not substantially changed from 8 mm previously.
Gallbladder surgically absent.

Pancreas: No focal mass lesion. No dilatation of the main duct. No
intraparenchymal cyst. No peripancreatic edema.

Spleen: No splenomegaly. No focal mass lesion.

Adrenals/Urinary Tract: No adrenal nodule or mass. Similar
appearance 17 mm cyst upper pole right kidney. Adjacent small cysts
in the lower pole right kidney are similar to prior. Left kidney
unremarkabl No evidence for hydroureter. The urinary bladder appears
normal for the degree of distention. e.

Stomach/Bowel: Surgical changes noted in the region of the
esophagogastric junction. Status post gastrojejunostomy. No small
bowel wall thickening. No small bowel dilatation. The terminal ileum
is normal. The appendix is not well visualized, but there is no
edema or inflammation in the region of the cecum. Large colonic
stool volume. Diverticular changes noted left colon.

Vascular/Lymphatic: There is abdominal aortic atherosclerosis
without aneurysm. There is no gastrohepatic or hepatoduodenal
ligament lymphadenopathy. No retroperitoneal or mesenteric
lymphadenopathy. No pelvic sidewall lymphadenopathy.

Reproductive: The uterus is unremarkable.  There is no adnexal mass.

Other: No intraperitoneal free fluid.

Musculoskeletal: No worrisome lytic or sclerotic osseous
abnormality.
IMPRESSION: 1. Stable exam. No new or progressive interval findings to suggest
recurrent or metastatic disease.
2. Large colonic stool volume. Imaging features would be compatible
with constipation in the appropriate clinical setting.
3. Similar appearance of intra and extrahepatic biliary duct
dilatation status post cholecystectomy. Findings likely reflect
sequelae of prior surgery. Correlation with liver function tests may
prove helpful.
4. Right renal cysts.
5. Aortic Atherosclerosis ([id number]).

## 2021-12-05 NOTE — Telephone Encounter (Signed)
Pt called to set an appt. Please call pt to ste an appt. Pt phone number is [phone].

## 2021-12-08 ENCOUNTER — Encounter: Payer: Self-pay | Admitting: Podiatry

## 2021-12-08 ENCOUNTER — Ambulatory Visit (INDEPENDENT_AMBULATORY_CARE_PROVIDER_SITE_OTHER): Payer: Medicare Other | Admitting: Podiatry

## 2021-12-08 DIAGNOSIS — M79675 Pain in left toe(s): Secondary | ICD-10-CM | POA: Diagnosis not present

## 2021-12-08 DIAGNOSIS — N179 Acute kidney failure, unspecified: Secondary | ICD-10-CM

## 2021-12-08 DIAGNOSIS — B351 Tinea unguium: Secondary | ICD-10-CM

## 2021-12-08 DIAGNOSIS — M79674 Pain in right toe(s): Secondary | ICD-10-CM | POA: Diagnosis not present

## 2021-12-08 NOTE — Progress Notes (Signed)
This patient returns to my office for at risk foot care.  This patient requires this care by a professional since this patient will be at risk due to having  Diabetes.  This patient is unable to cut nails herself since the patient cannot reach her nails.These nails are painful walking and wearing shoes.  This patient presents for at risk foot care today.   General Appearance  Alert, conversant and in no acute stress.  Vascular  Dorsalis pedis and posterior tibial  pulses are palpable  bilaterally.  Capillary return is within normal limits  bilaterally. Temperature is within normal limits  bilaterally.  Neurologic  Senn-Weinstein monofilament wire test absent   bilaterally. Muscle power within normal limits bilaterally.  Nails Thick disfigured discolored nails with subungual debris  from hallux to fifth toes bilaterally. No evidence of bacterial infection or drainage bilaterally.  Orthopedic  No limitations of motion  feet .  No crepitus or effusions noted.  No bony pathology or digital deformities noted.  Skin  normotropic skin with no porokeratosis noted bilaterally.  No signs of infections or ulcers noted.     Onychomycosis  Pain in right toes  Pain in left toes  Consent was obtained for treatment procedures.   Mechanical debridement of nails 1-5  bilaterally performed with a nail nipper.  Filed with dremel without incident.  Discussed the peeling with this patient.    Return office visit   3 months                   Told patient to return for periodic foot care and evaluation due to potential at risk complications.   Gardiner Barefoot DPM

## 2021-12-09 ENCOUNTER — Telehealth: Payer: Self-pay | Admitting: Physical Medicine and Rehabilitation

## 2021-12-09 NOTE — Telephone Encounter (Signed)
Pt called to set an appt. Pt phone number is [phone]

## 2021-12-17 ENCOUNTER — Inpatient Hospital Stay: Payer: Medicare Other

## 2021-12-18 ENCOUNTER — Other Ambulatory Visit: Payer: Medicare Other | Admitting: Hospice

## 2021-12-22 ENCOUNTER — Telehealth: Payer: Self-pay | Admitting: Physical Medicine and Rehabilitation

## 2021-12-22 NOTE — Telephone Encounter (Signed)
Patient called needing to reschedule her appointment. The number to contact patient is [phone]

## 2021-12-24 ENCOUNTER — Encounter: Payer: Medicare Other | Admitting: Physical Medicine and Rehabilitation

## 2021-12-25 NOTE — Telephone Encounter (Signed)
Inbound call from patient inquiring if she has been found a Dr in [employer]. Please give patient a call back to further advise.  Thank you

## 2021-12-26 NOTE — Telephone Encounter (Signed)
Refaxed the referral. [facility] did not have the referral information in the system.

## 2021-12-28 IMAGING — MG MM DIGITAL SCREENING BILAT W/ TOMO AND CAD
8 series · 9 of 24 positions shown · non-contrast
Comparison: Previous exam(s).

CLINICAL DATA: Screening.

EXAM:
DIGITAL SCREENING BILATERAL MAMMOGRAM WITH TOMOSYNTHESIS AND CAD
TECHNIQUE: Bilateral screening digital craniocaudal and mediolateral oblique
mammograms were obtained. Bilateral screening digital breast
tomosynthesis was performed. The images were evaluated with
computer-aided detection.

[L MLO synth-2D]
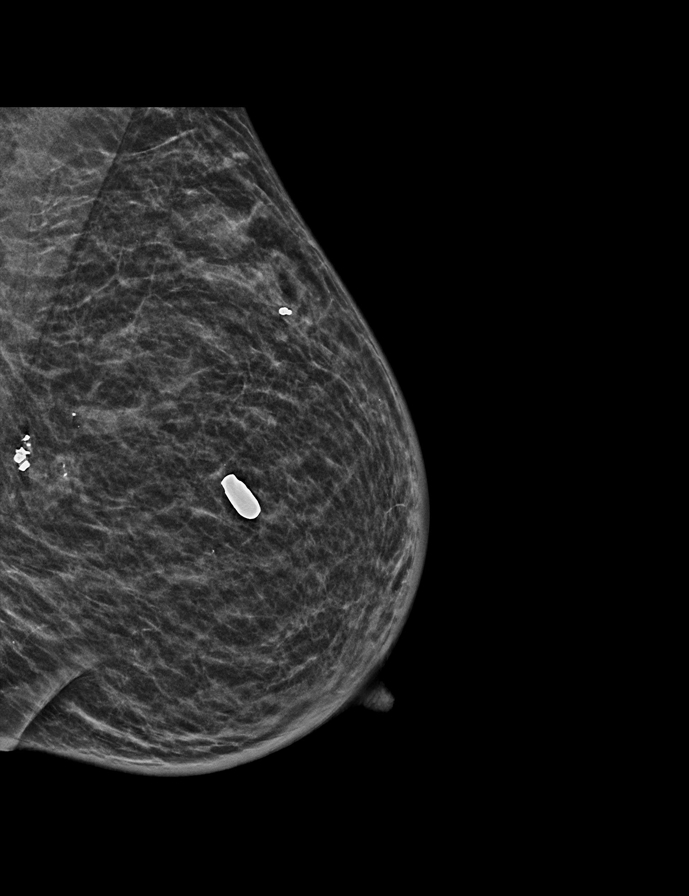

[R CC synth-2D]
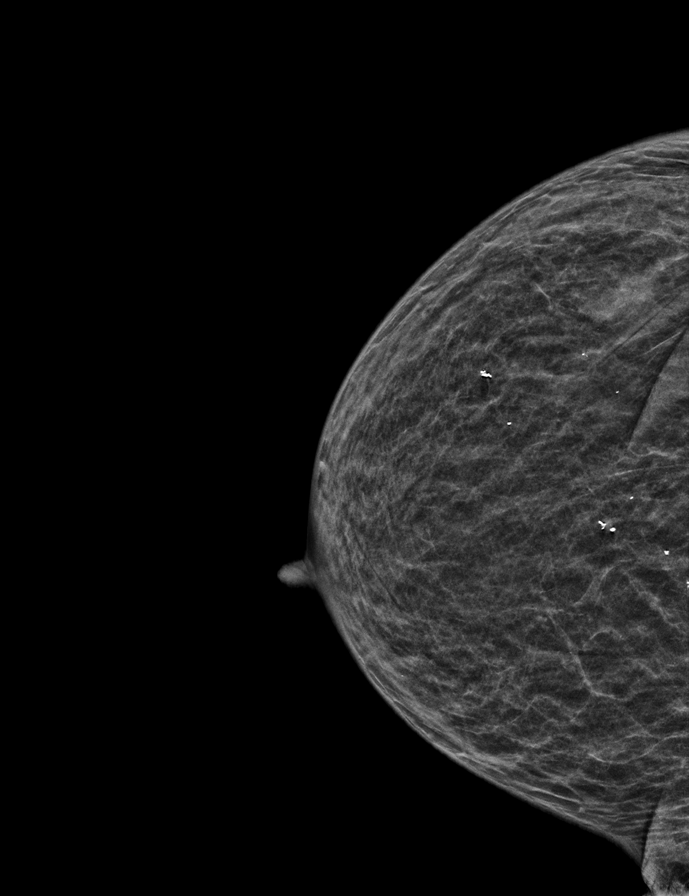

[L CC synth-2D]
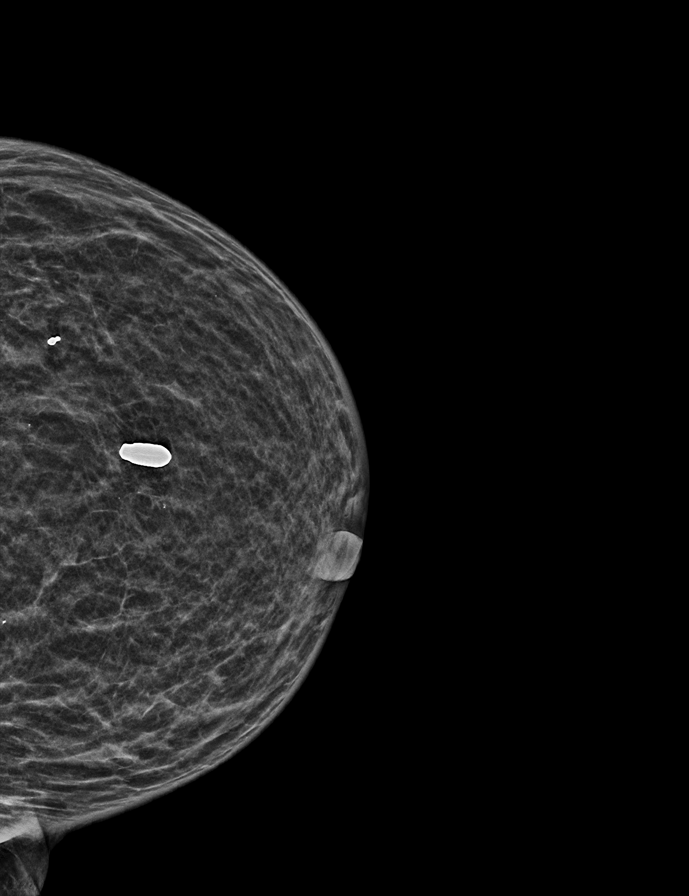

[R MLO synth-2D]
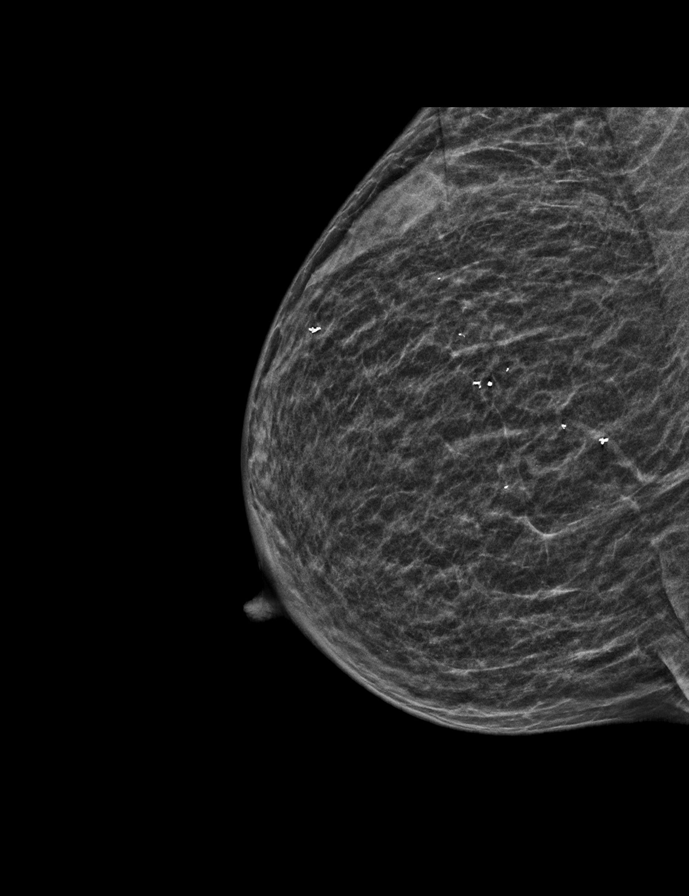

[R MLO tomo · 2 of 33 frames shown]
[frame 11/33]
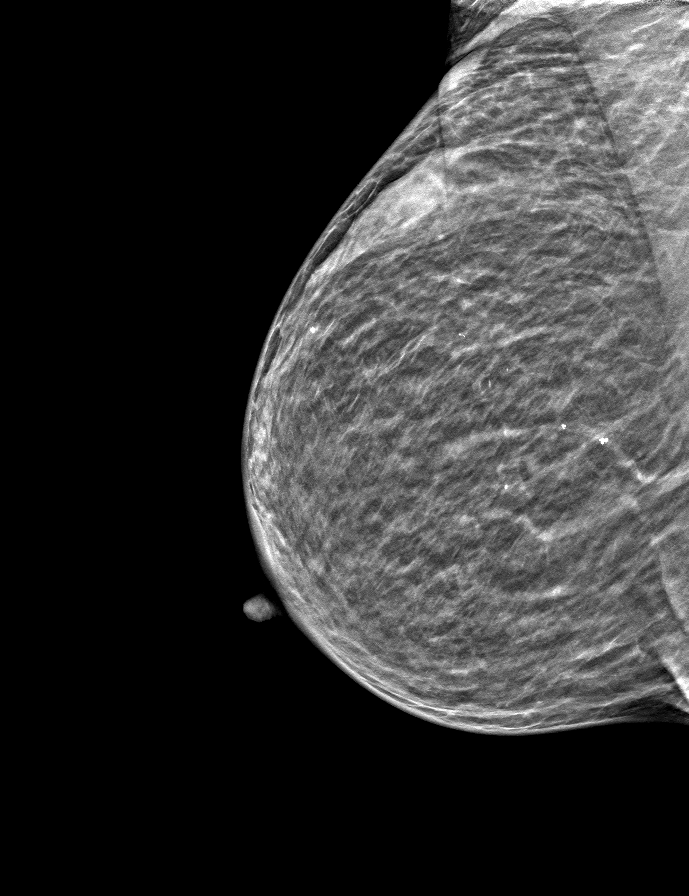
[frame 17/33]
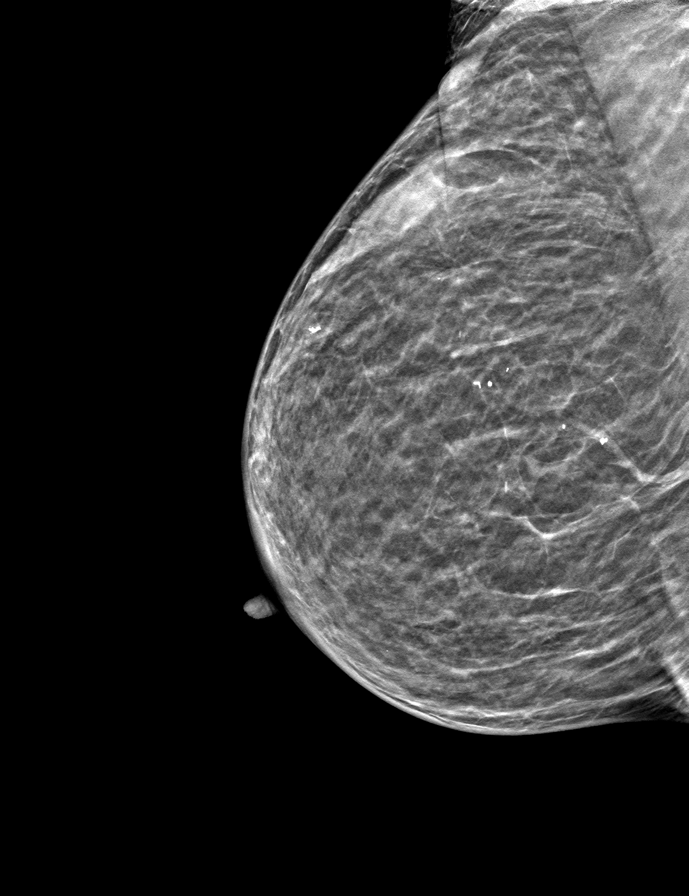

[L CC tomo · tomo slice 16/31.0]
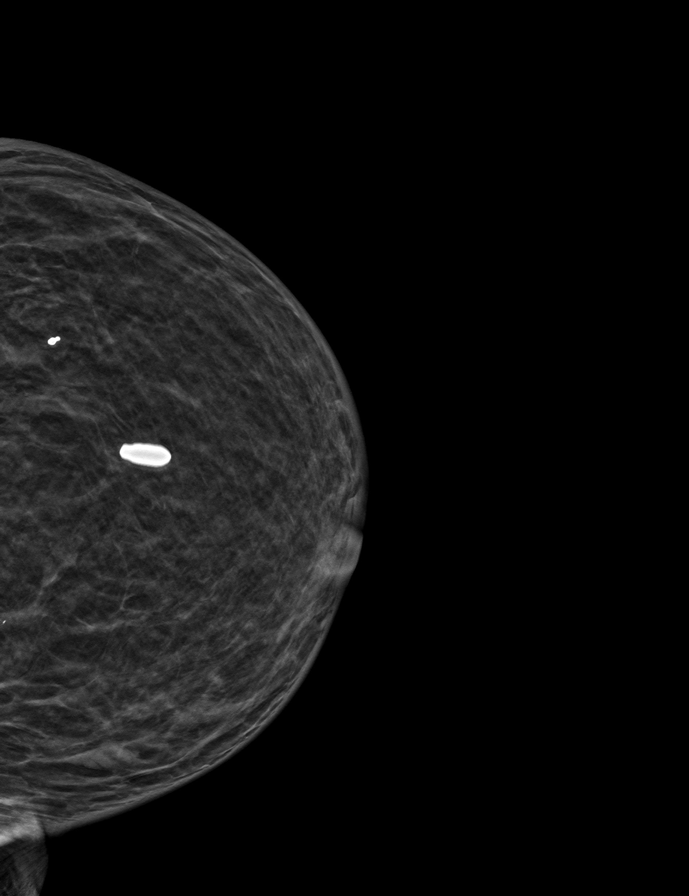

[L MLO tomo · tomo slice 18/35.0]
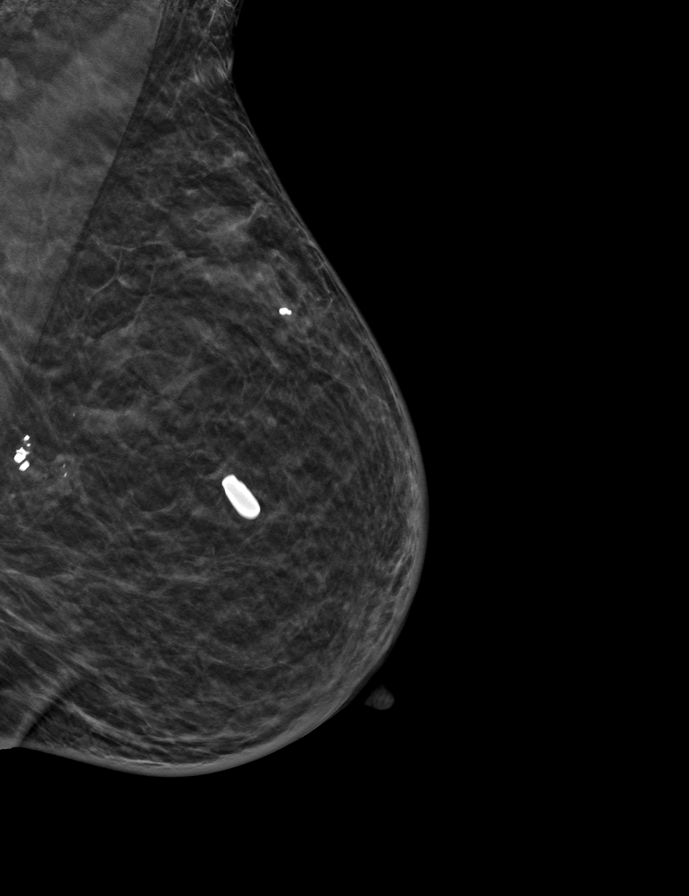

[R CC tomo · tomo slice 17/33.0]
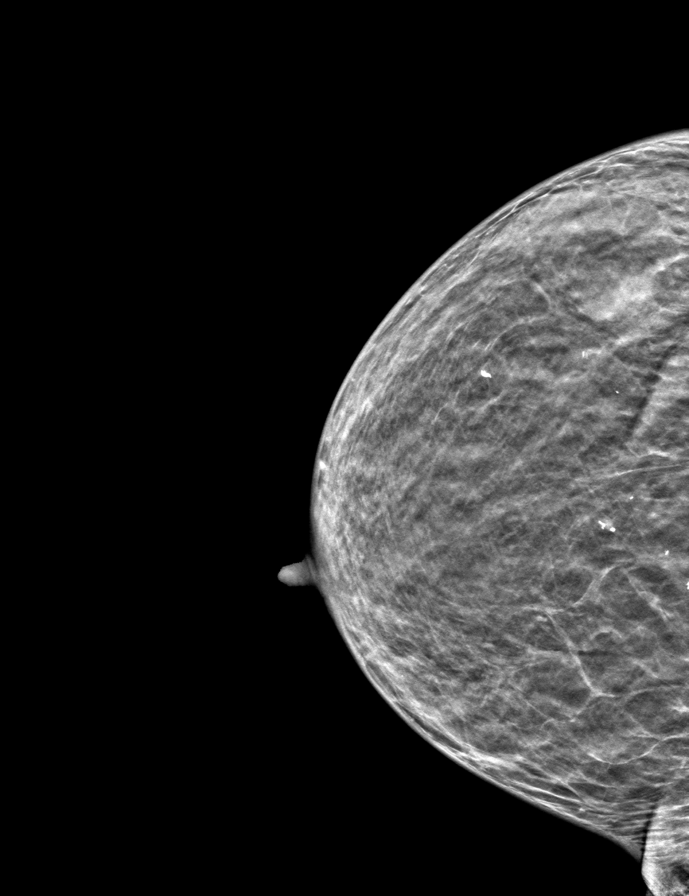

[9 of 24 positions shown; findings below may reference images not displayed]

ACR Breast Density Category b: There are scattered areas of
fibroglandular density.
FINDINGS: There are no findings suspicious for malignancy. The images were
evaluated with computer-aided detection.
IMPRESSION: No mammographic evidence of malignancy. A result letter of this
screening mammogram will be mailed directly to the patient.

RECOMMENDATION:
Screening mammogram in one year. (Code:WJ-[id number])

BI-RADS CATEGORY  1: Negative.

## 2021-12-30 ENCOUNTER — Other Ambulatory Visit: Payer: Self-pay | Admitting: Physical Medicine and Rehabilitation

## 2021-12-30 NOTE — Telephone Encounter (Signed)
Pt would like a call to know the status on her referral that was sent to [facility]. Please advise, thank you.

## 2021-12-31 ENCOUNTER — Ambulatory Visit (INDEPENDENT_AMBULATORY_CARE_PROVIDER_SITE_OTHER): Payer: Medicare Other | Admitting: Neurology

## 2021-12-31 ENCOUNTER — Encounter: Payer: Self-pay | Admitting: Neurology

## 2021-12-31 VITALS — BP 160/94 | HR 118 | Ht 63.0 in | Wt 86.6 lb

## 2021-12-31 DIAGNOSIS — F444 Conversion disorder with motor symptom or deficit: Secondary | ICD-10-CM

## 2021-12-31 DIAGNOSIS — E538 Deficiency of other specified B group vitamins: Secondary | ICD-10-CM | POA: Diagnosis not present
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
## 2021-12-31 NOTE — Telephone Encounter (Signed)
It is under review at the [facility] office. The doctor is reviewing her records to see if she can be helped by that office.

## 2022-01-01 ENCOUNTER — Inpatient Hospital Stay: Payer: Medicare Other

## 2022-01-01 VITALS — BP 153/85 | HR 103 | Temp 98.5°F | Resp 18

## 2022-01-01 DIAGNOSIS — C7A8 Other malignant neuroendocrine tumors: Secondary | ICD-10-CM | POA: Diagnosis not present

## 2022-01-01 DIAGNOSIS — D519 Vitamin B12 deficiency anemia, unspecified: Secondary | ICD-10-CM

## 2022-01-01 MED ORDER — CYANOCOBALAMIN 1000 MCG/ML IJ SOLN
1000.0000 ug | Freq: Once | INTRAMUSCULAR | Status: AC
  Administered 2022-01-01: 1000 ug via INTRAMUSCULAR
  Filled 2022-01-01: qty 1

## 2022-01-01 NOTE — Patient Instructions (Signed)
Vitamin B12 Injection What is this medication? Vitamin B12 (VAHY tuh min B12) prevents and treats low vitamin B12 levels in your body. It is used in people who do not get enough vitamin B12 from their diet or when their digestive tract does not absorb enough. Vitamin B12 plays an important role in maintaining the health of your nervous system and red blood cells. This medicine may be used for other purposes; ask your health care provider or pharmacist if you have questions. COMMON BRAND NAME(S): B-12 Compliance Kit, B-12 Injection Kit, Cyomin, Dodex, LA-12, Nutri-Twelve, Physicians EZ Use B-12, Primabalt What should I tell my care team before I take this medication? They need to know if you have any of these conditions: Kidney disease Leber's disease Megaloblastic anemia An unusual or allergic reaction to cyanocobalamin, cobalt, other medications, foods, dyes, or preservatives Pregnant or trying to get pregnant Breast-feeding How should I use this medication? This medication is injected into a muscle or deeply under the skin. It is usually given in a clinic or care team's office. However, your care team may teach you how to inject yourself. Follow all instructions. Talk to your care team about the use of this medication in children. Special care may be needed. Overdosage: If you think you have taken too much of this medicine contact a poison control center or emergency room at once. NOTE: This medicine is only for you. Do not share this medicine with others. What if I miss a dose? If you are given your dose at a clinic or care team's office, call to reschedule your appointment. If you give your own injections, and you miss a dose, take it as soon as you can. If it is almost time for your next dose, take only that dose. Do not take double or extra doses. What may interact with this medication? Alcohol Colchicine This list may not describe all possible interactions. Give your health care  provider a list of all the medicines, herbs, non-prescription drugs, or dietary supplements you use. Also tell them if you smoke, drink alcohol, or use illegal drugs. Some items may interact with your medicine. What should I watch for while using this medication? Visit your care team regularly. You may need blood work done while you are taking this medication. You may need to follow a special diet. Talk to your care team. Limit your alcohol intake and avoid smoking to get the best benefit. What side effects may I notice from receiving this medication? Side effects that you should report to your care team as soon as possible: Allergic reactions--skin rash, itching, hives, swelling of the face, lips, tongue, or throat Swelling of the ankles, hands, or feet Trouble breathing Side effects that usually do not require medical attention (report to your care team if they continue or are bothersome): Diarrhea This list may not describe all possible side effects. Call your doctor for medical advice about side effects. You may report side effects to FDA at 1-800-FDA-1088. Where should I keep my medication? Keep out of the reach of children. Store at room temperature between 15 and 30 degrees C (59 and 85 degrees F). Protect from light. Throw away any unused medication after the expiration date. NOTE: This sheet is a summary. It may not cover all possible information. If you have questions about this medicine, talk to your doctor, pharmacist, or health care provider.  2023 Elsevier/Gold Standard (2020-06-27 00:00:00)

## 2022-01-07 ENCOUNTER — Encounter: Payer: Medicare Other | Attending: Physical Medicine and Rehabilitation | Admitting: Registered Nurse

## 2022-01-07 ENCOUNTER — Encounter: Payer: Self-pay | Admitting: Registered Nurse

## 2022-01-07 VITALS — BP 120/76 | HR 104 | Ht 63.0 in | Wt 88.2 lb

## 2022-01-07 DIAGNOSIS — G894 Chronic pain syndrome: Secondary | ICD-10-CM | POA: Diagnosis present

## 2022-01-07 DIAGNOSIS — Z5181 Encounter for therapeutic drug level monitoring: Secondary | ICD-10-CM

## 2022-01-07 DIAGNOSIS — R1084 Generalized abdominal pain: Secondary | ICD-10-CM

## 2022-01-07 DIAGNOSIS — Z79891 Long term (current) use of opiate analgesic: Secondary | ICD-10-CM

## 2022-01-07 MED ORDER — OXYCODONE-ACETAMINOPHEN 7.5-325 MG PO TABS: 1.0000 | ORAL_TABLET | Freq: Two times a day (BID) | ORAL | 0 refills | Status: DC | PRN
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
## 2022-01-08 ENCOUNTER — Encounter: Payer: Self-pay | Admitting: Registered Nurse

## 2022-01-11 ENCOUNTER — Other Ambulatory Visit: Payer: Self-pay | Admitting: Gastroenterology

## 2022-01-11 LAB — DRUG TOX MONITOR 1 W/CONF, ORAL FLD
Amphetamines: NEGATIVE ng/mL (ref <10)
Barbiturates: NEGATIVE ng/mL (ref <10)
Benzodiazepines: NEGATIVE ng/mL (ref <0.50)
Buprenorphine: NEGATIVE ng/mL (ref <0.10)
Cocaine: NEGATIVE ng/mL (ref <5.0)
Cotinine: 8.5 ng/mL — ABNORMAL HIGH (ref <5.0)
Fentanyl: NEGATIVE ng/mL (ref <0.10)
Heroin Metabolite: NEGATIVE ng/mL (ref <1.0)
MARIJUANA: NEGATIVE ng/mL (ref <2.5)
MDMA: NEGATIVE ng/mL (ref <10)
Meprobamate: NEGATIVE ng/mL (ref <2.5)
Methadone: NEGATIVE ng/mL (ref <5.0)
Nicotine Metabolite: POSITIVE ng/mL — AB (ref <5.0)
Opiates: NEGATIVE ng/mL (ref <2.5)
Phencyclidine: NEGATIVE ng/mL (ref <10)
Tapentadol: NEGATIVE ng/mL (ref <5.0)
Tramadol: NEGATIVE ng/mL (ref <5.0)
Zolpidem: NEGATIVE ng/mL (ref <5.0)

## 2022-01-11 LAB — DRUG TOX ALC METAB W/CON, ORAL FLD: Alcohol Metabolite: NEGATIVE ng/mL (ref <25)

## 2022-01-12 ENCOUNTER — Encounter: Payer: Self-pay | Admitting: Physical Medicine and Rehabilitation

## 2022-01-12 ENCOUNTER — Ambulatory Visit: Payer: Self-pay

## 2022-01-12 ENCOUNTER — Ambulatory Visit (INDEPENDENT_AMBULATORY_CARE_PROVIDER_SITE_OTHER): Payer: Medicare Other | Admitting: Physical Medicine and Rehabilitation

## 2022-01-12 VITALS — BP 161/77 | HR 108

## 2022-01-12 DIAGNOSIS — M47816 Spondylosis without myelopathy or radiculopathy, lumbar region: Secondary | ICD-10-CM | POA: Diagnosis not present

## 2022-01-12 MED ORDER — METHYLPREDNISOLONE ACETATE 80 MG/ML IJ SUSP
80.0000 mg | Freq: Once | INTRAMUSCULAR | Status: AC
  Administered 2022-01-12: 80 mg

## 2022-01-12 NOTE — Progress Notes (Signed)
Right sided to middle lbp. Not in as much pain today  Numeric Pain Rating Scale and Functional Assessment Average Pain 8   In the last MONTH (on 0-10 scale) has pain interfered with the following?  1. General activity like being  able to carry out your everyday physical activities such as walking, climbing stairs, carrying groceries, or moving a chair?  Rating(10)   +Driver, -BT, -Dye Allergies.

## 2022-01-12 NOTE — Patient Instructions (Signed)

## 2022-01-14 ENCOUNTER — Other Ambulatory Visit: Payer: Self-pay

## 2022-01-14 ENCOUNTER — Inpatient Hospital Stay: Payer: Medicare Other | Attending: Hematology

## 2022-01-14 DIAGNOSIS — D519 Vitamin B12 deficiency anemia, unspecified: Secondary | ICD-10-CM | POA: Diagnosis present

## 2022-01-14 MED ORDER — CYANOCOBALAMIN 1000 MCG/ML IJ SOLN
1000.0000 ug | Freq: Once | INTRAMUSCULAR | Status: AC
  Administered 2022-01-14: 1000 ug via INTRAMUSCULAR
  Filled 2022-01-14: qty 1

## 2022-01-14 NOTE — Patient Instructions (Signed)
Vitamin B12 Deficiency Vitamin B12 deficiency means that your body does not have enough vitamin B12. The body needs this important vitamin: To make red blood cells. To make genes (DNA). To help the nerves work. If you do not have enough vitamin B12 in your body, you can have health problems, such as not having enough red blood cells in the blood (anemia). What are the causes? Not eating enough foods that contain vitamin B12. Not being able to take in (absorb) vitamin B12 from the food that you eat. Certain diseases. A condition in which the body does not make enough of a certain protein. This results in your body not taking in enough vitamin B12. Having a surgery in which part of the stomach or small intestine is taken out. Taking medicines that make it hard for the body to take in vitamin B12. These include: Heartburn medicines. Some medicines that are used to treat diabetes. What increases the risk? Being an older adult. Eating a vegetarian or vegan diet that does not include any foods that come from animals. Not eating enough foods that contain vitamin B12 while you are pregnant. Taking certain medicines. Having alcoholism. What are the signs or symptoms? In some cases, there are no symptoms. If the condition leads to too few blood cells or nerve damage, symptoms can occur, such as: Feeling weak or tired. Not being hungry. Losing feeling (numbness) or tingling in your hands and feet. Redness and burning of the tongue. Feeling sad (depressed). Confusion or memory problems. Trouble walking. If anemia is very bad, symptoms can include: Being short of breath. Being dizzy. Having a very fast heartbeat. How is this treated? Changing the way you eat and drink, such as: Eating more foods that contain vitamin B12. Drinking little or no alcohol. Getting vitamin B12 shots. Taking vitamin B12 supplements by mouth (orally). Your doctor will tell you the dose that is best for you. Follow  these instructions at home: Eating and drinking  Eat foods that come from animals and have a lot of vitamin B12 in them. These include: Meats and poultry. This includes beef, pork, chicken, turkey, and organ meats, such as liver. Seafood, such as clams, rainbow trout, salmon, tuna, and haddock. Eggs. Dairy foods such as milk, yogurt, and cheese. Eat breakfast cereals that have vitamin B12 added to them (are fortified). Check the label. The items listed above may not be a complete list of foods and beverages you can eat and drink. Contact a dietitian for more information. Alcohol use Do not drink alcohol if: Your doctor tells you not to drink. You are pregnant, may be pregnant, or are planning to become pregnant. If you drink alcohol: Limit how much you have to: 0-1 drink a day for women. 0-2 drinks a day for men. Know how much alcohol is in your drink. In the [place] one drink equals one 12 oz bottle of beer (355 mL), one 5 oz glass of wine (148 mL), or one 1 oz glass of hard liquor (44 mL). General instructions Get any vitamin B12 shots if told by your doctor. Take supplements only as told by your doctor. Follow the directions. Keep all follow-up visits. Contact a doctor if: Your symptoms come back. Your symptoms get worse or do not get better with treatment. Get help right away if: You have trouble breathing. You have a very fast heartbeat. You have chest pain. You get dizzy. You faint. These symptoms may be an emergency. Get help right away. Call 911.   Do not wait to see if the symptoms will go away. Do not drive yourself to the hospital. Summary Vitamin B12 deficiency means that your body is not getting enough of the vitamin. In some cases, there are no symptoms of this condition. Treatment may include making a change in the way you eat and drink, getting shots, or taking supplements. Eat foods that have vitamin B12 in them. This information is not intended to replace advice  given to you by your health care provider. Make sure you discuss any questions you have with your health care provider. Document Revised: 12/13/2020 Document Reviewed: 12/13/2020 Elsevier Patient Education  2023 Elsevier Inc.  

## 2022-01-16 ENCOUNTER — Telehealth: Payer: Self-pay | Admitting: *Deleted

## 2022-01-16 NOTE — Telephone Encounter (Signed)
Oral drug screen was negative for medications. This is inconsistent since she reported taking her oxycodone acetaminophen on 01/06/22. Previous screens showed positive opioids. Nicotine was present so it was not a totally negative screen but no medication or metabolites were present. She was not out of her medication.

## 2022-01-20 ENCOUNTER — Telehealth: Payer: Self-pay

## 2022-01-20 NOTE — Telephone Encounter (Signed)
Patient is calling stating she has had some abdominal pain for 3-4 days. She states the pain wakes her up at night and she has been taking 3 Oxycodone in 1 day. She wants to know what else she can do. Please advise.

## 2022-01-20 NOTE — Telephone Encounter (Signed)
Spoke with patient and informed her to call PCP or GI physician or go to the ER for evaluation

## 2022-01-20 NOTE — Telephone Encounter (Signed)
Please have patient call her PCP and GI Physician. She should got to the emergency room for evaluation.

## 2022-01-27 NOTE — Progress Notes (Signed)
Emily [name] - 81 y.o. female MRN [id number]  Date of birth: Jul 04, 1940  Office Visit Note: Visit Date: 01/12/2022 PCP: Buzzy Han, MD (Inactive) Referred by: Magnus Sinning, MD  Subjective: Chief Complaint  Patient presents with   Lower Back - Pain   HPI:  Emily [name] is a 81 y.o. female who comes in today for planned repeat bilateral L5-S1 intra-articular facet joint injection fluoroscopic guidance.  Patient has been seen in our office over the years and done well with intermittent facet joint blocks.  MRI from 2017 showing mainly facet arthritis at L5-S1.  She suffers from chronic pain and chronic debilitating conditions and B12 deficiency as well as significant weight loss and abdominal problems.  She is followed by physical medicine rehabilitation, Danella Sensing, NP and Leeroy Cha, MD.  Last time we saw her was in May of last year and completed facet joint injections that she felt were beneficial at the time.  No recent trauma or red flag complaints.  Consider updating MRI of the lumbar spine.   ROS Otherwise per HPI.  Assessment & Plan: Visit Diagnoses:    ICD-10-CM   1. Spondylosis without myelopathy or radiculopathy, lumbar region  M47.816 XR C-ARM NO REPORT    Facet Injection    methylPREDNISolone acetate (DEPO-MEDROL) injection 80 mg      Plan: No additional findings.   Meds & Orders:  Meds ordered this encounter  Medications   methylPREDNISolone acetate (DEPO-MEDROL) injection 80 mg    Orders Placed This Encounter  Procedures   Facet Injection   XR C-ARM NO REPORT    Follow-up: Return if symptoms worsen or fail to improve.   Procedures: No procedures performed  Lumbar Facet Joint Intra-Articular Injection(s) with Fluoroscopic Guidance  Patient: Emily [name]      Date of Birth: 10/11/1940 MRN: [id number] PCP: Buzzy Han, MD (Inactive)      Visit Date: 01/12/2022   Universal Protocol:    Date/Time:  01/12/2022  Consent Given By: the patient  Position: PRONE   Additional Comments: Vital signs were monitored before and after the procedure. Patient was prepped and draped in the usual sterile fashion. The correct patient, procedure, and site was verified.   Injection Procedure Details:  Procedure Site One Meds Administered:  Meds ordered this encounter  Medications   methylPREDNISolone acetate (DEPO-MEDROL) injection 80 mg     Laterality: Bilateral  Location/Site:  L5-S1  Needle size: 22 guage  Needle type: Spinal  Needle Placement: Articular  Findings:  -Comments: Excellent flow of contrast producing a partial arthrogram.  Procedure Details: The fluoroscope beam is vertically oriented in AP, and the inferior recess is visualized beneath the lower pole of the inferior apophyseal process, which represents the target point for needle insertion. When direct visualization is difficult the target point is located at the medial projection of the vertebral pedicle. The region overlying each aforementioned target is locally anesthetized with a 1 to 2 ml. volume of 1% Lidocaine without Epinephrine.   The spinal needle was inserted into each of the above mentioned facet joints using biplanar fluoroscopic guidance. A 0.25 to 0.5 ml. volume of Isovue-250 was injected and a partial facet joint arthrogram was obtained. A single spot film was obtained of the resulting arthrogram.    One to 1.25 ml of the steroid/anesthetic solution was then injected into each of the facet joints noted above.   Additional Comments:  The patient tolerated the procedure well Dressing: 2 x 2 sterile gauze and Band-Aid  Post-procedure details: Patient was observed during the procedure. Post-procedure instructions were reviewed.  Patient left the clinic in stable condition.    Clinical History: IMPRESSION:  1. At L5-S1 there is a broad-based disc bulge with bilateral facet  arthropathy and right  lateral recess stenosis.  2. At L1-2 there is a broad-based disc bulge eccentric towards the  right. Mild bilateral facet arthropathy.        Electronically Signed    By: Kathreen Devoid    On: 03/24/2016 16:05     Objective:  VS:  HT:    WT:   BMI:     BP:(!) 161/77  HR:(!) 108bpm  TEMP: ( )  RESP:  Physical Exam Vitals and nursing note reviewed.  Constitutional:      General: She is not in acute distress.    Appearance: Normal appearance. She is not ill-appearing.  HENT:     Head: Normocephalic and atraumatic.     Right Ear: External ear normal.     Left Ear: External ear normal.  Eyes:     Extraocular Movements: Extraocular movements intact.  Cardiovascular:     Rate and Rhythm: Normal rate.     Pulses: Normal pulses.  Pulmonary:     Effort: Pulmonary effort is normal. No respiratory distress.  Abdominal:     General: There is no distension.     Palpations: Abdomen is soft.  Musculoskeletal:        General: Tenderness present.     Cervical back: Neck supple.     Right lower leg: No edema.     Left lower leg: No edema.     Comments: Patient has good distal strength with no pain over the greater trochanters.  No clonus or focal weakness.  Skin:    Findings: No erythema, lesion or rash.  Neurological:     General: No focal deficit present.     Mental Status: She is alert and oriented to person, place, and time.     Sensory: No sensory deficit.     Motor: No weakness or abnormal muscle tone.     Coordination: Coordination normal.  Psychiatric:        Mood and Affect: Mood normal.        Behavior: Behavior normal.      Imaging: No results found.

## 2022-01-27 NOTE — Procedures (Signed)
Lumbar Facet Joint Intra-Articular Injection(s) with Fluoroscopic Guidance  Patient: Emily [name]      Date of Birth: Mar 07, 1941 MRN: [id number] PCP: Buzzy Han, MD (Inactive)      Visit Date: 01/12/2022   Universal Protocol:    Date/Time: 01/12/2022  Consent Given By: the patient  Position: PRONE   Additional Comments: Vital signs were monitored before and after the procedure. Patient was prepped and draped in the usual sterile fashion. The correct patient, procedure, and site was verified.   Injection Procedure Details:  Procedure Site One Meds Administered:  Meds ordered this encounter  Medications   methylPREDNISolone acetate (DEPO-MEDROL) injection 80 mg     Laterality: Bilateral  Location/Site:  L5-S1  Needle size: 22 guage  Needle type: Spinal  Needle Placement: Articular  Findings:  -Comments: Excellent flow of contrast producing a partial arthrogram.  Procedure Details: The fluoroscope beam is vertically oriented in AP, and the inferior recess is visualized beneath the lower pole of the inferior apophyseal process, which represents the target point for needle insertion. When direct visualization is difficult the target point is located at the medial projection of the vertebral pedicle. The region overlying each aforementioned target is locally anesthetized with a 1 to 2 ml. volume of 1% Lidocaine without Epinephrine.   The spinal needle was inserted into each of the above mentioned facet joints using biplanar fluoroscopic guidance. A 0.25 to 0.5 ml. volume of Isovue-250 was injected and a partial facet joint arthrogram was obtained. A single spot film was obtained of the resulting arthrogram.    One to 1.25 ml of the steroid/anesthetic solution was then injected into each of the facet joints noted above.   Additional Comments:  The patient tolerated the procedure well Dressing: 2 x 2 sterile gauze and Band-Aid    Post-procedure  details: Patient was observed during the procedure. Post-procedure instructions were reviewed.  Patient left the clinic in stable condition.

## 2022-01-28 ENCOUNTER — Inpatient Hospital Stay: Payer: Medicare Other

## 2022-01-30 ENCOUNTER — Inpatient Hospital Stay: Payer: Medicare Other

## 2022-01-30 ENCOUNTER — Other Ambulatory Visit: Payer: Self-pay

## 2022-01-30 DIAGNOSIS — D519 Vitamin B12 deficiency anemia, unspecified: Secondary | ICD-10-CM

## 2022-01-30 MED ORDER — CYANOCOBALAMIN 1000 MCG/ML IJ SOLN
1000.0000 ug | Freq: Once | INTRAMUSCULAR | Status: AC
  Administered 2022-01-30: 1000 ug via INTRAMUSCULAR
  Filled 2022-01-30: qty 1

## 2022-02-03 ENCOUNTER — Other Ambulatory Visit: Payer: Self-pay

## 2022-02-03 NOTE — Telephone Encounter (Signed)
Mrs. Bresnan has #10, Oxycodone 7.5-325 MG tablets on hand. Her next appointment is with Dr. [name] on 02/10/2022. She is requesting a refill to be sent to [facility] on [address].    Filled  Written  ID  Drug  QTY  Days  Prescriber  RX #  Dispenser  Refill  Daily Dose*  Pymt Type  PMP  01/12/2022 01/07/2022 1  Oxycodone-Acetaminophn 7.5-325 60.00 30 Eu Tho [id number] [facility] ([id number]) 0/0 22.50 MME Medicare [place] 12/09/2021 12/04/2021 1  Oxycodone-Acetaminophn 7.5-325 60.00 30 Eu Tho [id number] [facility] ([id number]) 0/0 22.50 MME Medicare [place]

## 2022-02-03 NOTE — Telephone Encounter (Signed)
PMP was Reviewed.  30th day is 02/10/2022 Please call Ms. Guyett to verify her pill count.

## 2022-02-04 MED ORDER — OXYCODONE-ACETAMINOPHEN 7.5-325 MG PO TABS: 1.0000 | ORAL_TABLET | Freq: Two times a day (BID) | ORAL | 0 refills | Status: DC | PRN

## 2022-02-04 NOTE — Telephone Encounter (Signed)
I called Emily [name] today. She now has #8 Oxycodone 7.5-325 on hand.

## 2022-02-04 NOTE — Telephone Encounter (Signed)
PMP was Reviewed. Last fill 01/12/2022. Call placed to Emily [name], she states sometime she has taken oxycodone 7.5 325 three times a day, reviewed her Sig with her,the narcotic contract with her. Reviewed self medication can lead to her being discharge from our office, she verbalizes understanding.  She has an appointment with Dr [name] on 02/10/2022, she will discussed her increase intensity of pain at times with Dr [name], she verbalizes understanding.  Oxycodone e-scribed today. No early Refill.

## 2022-02-10 ENCOUNTER — Encounter
Payer: Medicare Other | Attending: Physical Medicine and Rehabilitation | Admitting: Physical Medicine and Rehabilitation

## 2022-02-10 ENCOUNTER — Encounter: Payer: Self-pay | Admitting: Physical Medicine and Rehabilitation

## 2022-02-10 VITALS — BP 168/82 | HR 90 | Ht 63.0 in | Wt 87.0 lb

## 2022-02-10 DIAGNOSIS — Z5181 Encounter for therapeutic drug level monitoring: Secondary | ICD-10-CM | POA: Insufficient documentation

## 2022-02-10 DIAGNOSIS — G894 Chronic pain syndrome: Secondary | ICD-10-CM | POA: Insufficient documentation

## 2022-02-10 DIAGNOSIS — Z79891 Long term (current) use of opiate analgesic: Secondary | ICD-10-CM | POA: Diagnosis present

## 2022-02-10 DIAGNOSIS — R1084 Generalized abdominal pain: Secondary | ICD-10-CM | POA: Diagnosis present

## 2022-02-10 MED ORDER — OXYCODONE-ACETAMINOPHEN 7.5-325 MG PO TABS: 1.0000 | ORAL_TABLET | Freq: Three times a day (TID) | ORAL | 0 refills | Status: DC | PRN

## 2022-02-10 NOTE — Patient Instructions (Signed)
Constipation:  -Provided list of following foods that help with constipation and highlighted a few: 1) prunes- contain high amounts of fiber.  2) apples- has a form of dietary fiber called pectin that accelerates stool movement and increases beneficial gut bacteria 3) pears- in addition to fiber, also high in fructose and sorbitol which have laxative effect 4) figs- contain an enzyme ficin which helps to speed colonic transit 5) kiwis- contain an enzyme actinidin that improves gut motility and reduces constipation 6) oranges- rich in pectin (like apples) 7) grapefruits- contain a flavanol naringenin which has a laxative effect 8) vegetables- rich in fiber and also great sources of folate, vitamin C, and K 9) artichoke- high in inulin, prebiotic great for the microbiome 10) chicory- increases stool frequency and softness (can be added to coffee) 11) rhubarb- laxative effect 12) sweet potato- high fiber 13) beans, peas, and lentils- contain both soluble and insoluble fiber 14) chia seeds- improves intestinal health and gut flora 15) flaxseeds- laxative effect 16) whole grain rye bread- high in fiber 17) oat bran- high in soluble and insoluble fiber 18) kefir- softens stools -recommended to try at least one of these foods every day.  -drink 6-8 glasses of water per day -walk regularly, especially after meals.      
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
## 2022-02-10 NOTE — Addendum Note (Signed)
Addended by: Franchot Gallo on: 02/10/2022 10:25 AM   Modules accepted: Orders

## 2022-02-11 ENCOUNTER — Other Ambulatory Visit: Payer: Self-pay

## 2022-02-11 ENCOUNTER — Inpatient Hospital Stay: Payer: Medicare Other | Attending: Hematology

## 2022-02-11 VITALS — BP 152/82 | HR 99 | Temp 98.7°F | Resp 17

## 2022-02-11 DIAGNOSIS — D519 Vitamin B12 deficiency anemia, unspecified: Secondary | ICD-10-CM | POA: Insufficient documentation

## 2022-02-11 DIAGNOSIS — Z79899 Other long term (current) drug therapy: Secondary | ICD-10-CM | POA: Insufficient documentation

## 2022-02-11 MED ORDER — CYANOCOBALAMIN 1000 MCG/ML IJ SOLN
1000.0000 ug | Freq: Once | INTRAMUSCULAR | Status: AC
  Administered 2022-02-11: 1000 ug via INTRAMUSCULAR
  Filled 2022-02-11: qty 1

## 2022-02-11 NOTE — Patient Instructions (Signed)
Vitamin B12 Deficiency Vitamin B12 deficiency means that your body does not have enough vitamin B12. The body needs this important vitamin: To make red blood cells. To make genes (DNA). To help the nerves work. If you do not have enough vitamin B12 in your body, you can have health problems, such as not having enough red blood cells in the blood (anemia). What are the causes? Not eating enough foods that contain vitamin B12. Not being able to take in (absorb) vitamin B12 from the food that you eat. Certain diseases. A condition in which the body does not make enough of a certain protein. This results in your body not taking in enough vitamin B12. Having a surgery in which part of the stomach or small intestine is taken out. Taking medicines that make it hard for the body to take in vitamin B12. These include: Heartburn medicines. Some medicines that are used to treat diabetes. What increases the risk? Being an older adult. Eating a vegetarian or vegan diet that does not include any foods that come from animals. Not eating enough foods that contain vitamin B12 while you are pregnant. Taking certain medicines. Having alcoholism. What are the signs or symptoms? In some cases, there are no symptoms. If the condition leads to too few blood cells or nerve damage, symptoms can occur, such as: Feeling weak or tired. Not being hungry. Losing feeling (numbness) or tingling in your hands and feet. Redness and burning of the tongue. Feeling sad (depressed). Confusion or memory problems. Trouble walking. If anemia is very bad, symptoms can include: Being short of breath. Being dizzy. Having a very fast heartbeat. How is this treated? Changing the way you eat and drink, such as: Eating more foods that contain vitamin B12. Drinking little or no alcohol. Getting vitamin B12 shots. Taking vitamin B12 supplements by mouth (orally). Your doctor will tell you the dose that is best for you. Follow  these instructions at home: Eating and drinking  Eat foods that come from animals and have a lot of vitamin B12 in them. These include: Meats and poultry. This includes beef, pork, chicken, turkey, and organ meats, such as liver. Seafood, such as clams, rainbow trout, salmon, tuna, and haddock. Eggs. Dairy foods such as milk, yogurt, and cheese. Eat breakfast cereals that have vitamin B12 added to them (are fortified). Check the label. The items listed above may not be a complete list of foods and beverages you can eat and drink. Contact a dietitian for more information. Alcohol use Do not drink alcohol if: Your doctor tells you not to drink. You are pregnant, may be pregnant, or are planning to become pregnant. If you drink alcohol: Limit how much you have to: 0-1 drink a day for women. 0-2 drinks a day for men. Know how much alcohol is in your drink. In the [place] one drink equals one 12 oz bottle of beer (355 mL), one 5 oz glass of wine (148 mL), or one 1 oz glass of hard liquor (44 mL). General instructions Get any vitamin B12 shots if told by your doctor. Take supplements only as told by your doctor. Follow the directions. Keep all follow-up visits. Contact a doctor if: Your symptoms come back. Your symptoms get worse or do not get better with treatment. Get help right away if: You have trouble breathing. You have a very fast heartbeat. You have chest pain. You get dizzy. You faint. These symptoms may be an emergency. Get help right away. Call 911.   Do not wait to see if the symptoms will go away. Do not drive yourself to the hospital. Summary Vitamin B12 deficiency means that your body is not getting enough of the vitamin. In some cases, there are no symptoms of this condition. Treatment may include making a change in the way you eat and drink, getting shots, or taking supplements. Eat foods that have vitamin B12 in them. This information is not intended to replace advice  given to you by your health care provider. Make sure you discuss any questions you have with your health care provider. Document Revised: 12/13/2020 Document Reviewed: 12/13/2020 Elsevier Patient Education  2023 Elsevier Inc.  

## 2022-02-13 LAB — DRUG TOX MONITOR 1 W/CONF, ORAL FLD
Amphetamines: NEGATIVE ng/mL (ref <10)
Barbiturates: NEGATIVE ng/mL (ref <10)
Benzodiazepines: NEGATIVE ng/mL (ref <0.50)
Buprenorphine: NEGATIVE ng/mL (ref <0.10)
Cocaine: NEGATIVE ng/mL (ref <5.0)
Codeine: NEGATIVE ng/mL (ref <2.5)
Cotinine: 21.1 ng/mL — ABNORMAL HIGH (ref <5.0)
Dihydrocodeine: NEGATIVE ng/mL (ref <2.5)
Fentanyl: NEGATIVE ng/mL (ref <0.10)
Heroin Metabolite: NEGATIVE ng/mL (ref <1.0)
Hydrocodone: NEGATIVE ng/mL (ref <2.5)
Hydromorphone: NEGATIVE ng/mL (ref <2.5)
MARIJUANA: NEGATIVE ng/mL (ref <2.5)
MDMA: NEGATIVE ng/mL (ref <10)
Meprobamate: NEGATIVE ng/mL (ref <2.5)
Methadone: NEGATIVE ng/mL (ref <5.0)
Morphine: NEGATIVE ng/mL (ref <2.5)
Nicotine Metabolite: POSITIVE ng/mL — AB (ref <5.0)
Norhydrocodone: NEGATIVE ng/mL (ref <2.5)
Noroxycodone: 6.1 ng/mL — ABNORMAL HIGH (ref <2.5)
Opiates: POSITIVE ng/mL — AB (ref <2.5)
Oxycodone: 40 ng/mL — ABNORMAL HIGH (ref <2.5)
Oxymorphone: NEGATIVE ng/mL (ref <2.5)
Phencyclidine: NEGATIVE ng/mL (ref <10)
Tapentadol: NEGATIVE ng/mL (ref <5.0)
Tramadol: NEGATIVE ng/mL (ref <5.0)
Zolpidem: NEGATIVE ng/mL (ref <5.0)

## 2022-02-13 LAB — DRUG TOX ALC METAB W/CON, ORAL FLD: Alcohol Metabolite: NEGATIVE ng/mL (ref <25)

## 2022-02-20 ENCOUNTER — Other Ambulatory Visit: Payer: Self-pay | Admitting: Physical Medicine and Rehabilitation

## 2022-02-23 ENCOUNTER — Telehealth: Payer: Self-pay | Admitting: *Deleted

## 2022-02-23 NOTE — Telephone Encounter (Signed)
Oral swab drug screen was consistent for prescribed medications.  ?

## 2022-02-25 ENCOUNTER — Inpatient Hospital Stay: Payer: Medicare Other

## 2022-02-27 ENCOUNTER — Other Ambulatory Visit: Payer: Self-pay

## 2022-02-27 ENCOUNTER — Inpatient Hospital Stay: Payer: Medicare Other

## 2022-02-27 DIAGNOSIS — D519 Vitamin B12 deficiency anemia, unspecified: Secondary | ICD-10-CM

## 2022-02-27 DIAGNOSIS — C7A8 Other malignant neuroendocrine tumors: Secondary | ICD-10-CM

## 2022-02-27 LAB — CBC WITH DIFFERENTIAL (CANCER CENTER ONLY)
Abs Immature Granulocytes: 0 10*3/uL (ref 0.00–0.07)
Basophils Absolute: 0 10*3/uL (ref 0.0–0.1)
Basophils Relative: 1 %
Eosinophils Absolute: 0 10*3/uL (ref 0.0–0.5)
Eosinophils Relative: 1 %
HCT: 38.4 % (ref 36.0–46.0)
Hemoglobin: 12.3 g/dL (ref 12.0–15.0)
Immature Granulocytes: 0 %
Lymphocytes Relative: 30 %
Lymphs Abs: 2 10*3/uL (ref 0.7–4.0)
MCH: 24.3 pg — ABNORMAL LOW (ref 26.0–34.0)
MCHC: 32 g/dL (ref 30.0–36.0)
MCV: 75.7 fL — ABNORMAL LOW (ref 80.0–100.0)
Monocytes Absolute: 0.4 10*3/uL (ref 0.1–1.0)
Monocytes Relative: 6 %
Neutro Abs: 4.3 10*3/uL (ref 1.7–7.7)
Neutrophils Relative %: 62 %
Platelet Count: 232 10*3/uL (ref 150–400)
RBC: 5.07 MIL/uL (ref 3.87–5.11)
RDW: 15.2 % (ref 11.5–15.5)
WBC Count: 6.7 10*3/uL (ref 4.0–10.5)
nRBC: 0 % (ref 0.0–0.2)

## 2022-02-27 LAB — CMP (CANCER CENTER ONLY)
ALT: 17 U/L (ref 0–44)
AST: 24 U/L (ref 15–41)
Albumin: 3.5 g/dL (ref 3.5–5.0)
Alkaline Phosphatase: 81 U/L (ref 38–126)
Anion gap: 4 — ABNORMAL LOW (ref 5–15)
BUN: 19 mg/dL (ref 8–23)
CO2: 27 mmol/L (ref 22–32)
Calcium: 8.7 mg/dL — ABNORMAL LOW (ref 8.9–10.3)
Chloride: 108 mmol/L (ref 98–111)
Creatinine: 1.21 mg/dL — ABNORMAL HIGH (ref 0.44–1.00)
GFR, Estimated: 45 mL/min — ABNORMAL LOW (ref >60)
Glucose, Bld: 110 mg/dL — ABNORMAL HIGH (ref 70–99)
Potassium: 4.6 mmol/L (ref 3.5–5.1)
Sodium: 139 mmol/L (ref 135–145)
Total Bilirubin: 0.3 mg/dL (ref 0.3–1.2)
Total Protein: 6.6 g/dL (ref 6.5–8.1)

## 2022-02-27 MED ORDER — CYANOCOBALAMIN 1000 MCG/ML IJ SOLN
1000.0000 ug | Freq: Once | INTRAMUSCULAR | Status: AC
  Administered 2022-02-27: 1000 ug via INTRAMUSCULAR
  Filled 2022-02-27: qty 1

## 2022-03-09 ENCOUNTER — Encounter: Payer: Medicare Other | Attending: Physical Medicine and Rehabilitation | Admitting: Registered Nurse

## 2022-03-09 ENCOUNTER — Encounter: Payer: Self-pay | Admitting: Registered Nurse

## 2022-03-09 VITALS — BP 112/71 | HR 94 | Ht 63.0 in | Wt 91.6 lb

## 2022-03-09 DIAGNOSIS — Z79891 Long term (current) use of opiate analgesic: Secondary | ICD-10-CM | POA: Diagnosis present

## 2022-03-09 DIAGNOSIS — R1084 Generalized abdominal pain: Secondary | ICD-10-CM | POA: Insufficient documentation

## 2022-03-09 DIAGNOSIS — G894 Chronic pain syndrome: Secondary | ICD-10-CM | POA: Diagnosis present

## 2022-03-09 DIAGNOSIS — Z5181 Encounter for therapeutic drug level monitoring: Secondary | ICD-10-CM | POA: Insufficient documentation
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
## 2022-03-11 ENCOUNTER — Inpatient Hospital Stay: Payer: Medicare Other | Attending: Hematology

## 2022-03-11 DIAGNOSIS — D519 Vitamin B12 deficiency anemia, unspecified: Secondary | ICD-10-CM | POA: Insufficient documentation

## 2022-03-11 MED ORDER — CYANOCOBALAMIN 1000 MCG/ML IJ SOLN
1000.0000 ug | Freq: Once | INTRAMUSCULAR | Status: AC
  Administered 2022-03-11: 1000 ug via INTRAMUSCULAR
  Filled 2022-03-11: qty 1

## 2022-03-16 ENCOUNTER — Telehealth: Payer: Self-pay

## 2022-03-16 ENCOUNTER — Encounter: Payer: Self-pay | Admitting: Podiatry

## 2022-03-16 ENCOUNTER — Ambulatory Visit (INDEPENDENT_AMBULATORY_CARE_PROVIDER_SITE_OTHER): Payer: Medicare Other | Admitting: Podiatry

## 2022-03-16 DIAGNOSIS — L309 Dermatitis, unspecified: Secondary | ICD-10-CM

## 2022-03-16 DIAGNOSIS — M79674 Pain in right toe(s): Secondary | ICD-10-CM

## 2022-03-16 DIAGNOSIS — N179 Acute kidney failure, unspecified: Secondary | ICD-10-CM

## 2022-03-16 DIAGNOSIS — E0842 Diabetes mellitus due to underlying condition with diabetic polyneuropathy: Secondary | ICD-10-CM

## 2022-03-16 DIAGNOSIS — M79675 Pain in left toe(s): Secondary | ICD-10-CM

## 2022-03-16 DIAGNOSIS — B351 Tinea unguium: Secondary | ICD-10-CM

## 2022-03-16 MED ORDER — AMMONIUM LACTATE 12 % EX CREA: TOPICAL_CREAM | CUTANEOUS | 0 refills | Status: DC

## 2022-03-16 MED ORDER — AMMONIUM LACTATE 12 % EX CREA: 1.0000 | TOPICAL_CREAM | CUTANEOUS | 0 refills | Status: DC | PRN

## 2022-03-16 NOTE — Telephone Encounter (Signed)
(  2:50 pm) PC SW completed a call to patient to assess her status and discuss program changes. Patient advised that she was stable. She reported that she has been having some nausea, but has medication for this that has been effective. She recently saw the podiatrist to have her toenails clipped. Patient has personal care services 5x week and that aide takes her to doctor's appointments and to rub errand. SW discussed program changes and advised patient that she will be moved to inactive status as her status is stable. Patient asked SW to talk with her aide also. SW explained the palliative care program to her as well. SW encouraged the patient and her aide to call with any changes in patient's condition or concern. Patient and her aide verbalized understanding.  No other concerns were noted.

## 2022-03-16 NOTE — Progress Notes (Addendum)
This patient returns to my office for at risk foot care.  This patient requires this care by a professional since this patient will be at risk due to having  Diabetes.  This patient is unable to cut nails herself since the patient cannot reach her nails.These nails are painful walking and wearing shoes.  This patient presents for at risk foot care today.   General Appearance  Alert, conversant and in no acute stress.  Vascular  Dorsalis pedis and posterior tibial  pulses are palpable  bilaterally.  Capillary return is within normal limits  bilaterally. Temperature is within normal limits  bilaterally.  Neurologic  Senn-Weinstein monofilament wire test absent   bilaterally. Muscle power within normal limits bilaterally.  Nails Thick disfigured discolored nails with subungual debris  from hallux to fifth toes bilaterally. No evidence of bacterial infection or drainage bilaterally.  Orthopedic  No limitations of motion  feet .  No crepitus or effusions noted.  No bony pathology or digital deformities noted.  Skin  normotropic skin with no porokeratosis noted bilaterally.  No signs of infections or ulcers noted.   Peeling noted around the outside of left foot as well as plantarly.  Onychomycosis  Pain in right toes  Pain in left toes  Dermatitis  Consent was obtained for treatment procedures.   Mechanical debridement of nails 1-5  bilaterally performed with a nail nipper.  Filed with dremel without incident.  Discussed the peeling with this patient. Prescribed lac-hydrin.   Return office visit   3 months                   Told patient to return for periodic foot care and evaluation due to potential at risk complications.   Gardiner Barefoot DPM

## 2022-03-16 NOTE — Addendum Note (Signed)
Addended by: Gardiner Barefoot on: 03/16/2022 02:36 PM   Modules accepted: Level of Service

## 2022-03-25 ENCOUNTER — Inpatient Hospital Stay: Payer: Medicare Other

## 2022-04-01 ENCOUNTER — Inpatient Hospital Stay: Payer: Medicare Other

## 2022-04-01 DIAGNOSIS — D519 Vitamin B12 deficiency anemia, unspecified: Secondary | ICD-10-CM

## 2022-04-01 MED ORDER — CYANOCOBALAMIN 1000 MCG/ML IJ SOLN
1000.0000 ug | Freq: Once | INTRAMUSCULAR | Status: AC
  Administered 2022-04-01: 1000 ug via INTRAMUSCULAR
  Filled 2022-04-01: qty 1

## 2022-04-06 ENCOUNTER — Telehealth: Payer: Self-pay | Admitting: Physical Medicine and Rehabilitation

## 2022-04-06 ENCOUNTER — Other Ambulatory Visit: Payer: Self-pay | Admitting: Physical Medicine and Rehabilitation

## 2022-04-06 ENCOUNTER — Encounter: Payer: Medicare Other | Admitting: Registered Nurse

## 2022-04-06 MED ORDER — OXYCODONE-ACETAMINOPHEN 7.5-325 MG PO TABS: 1.0000 | ORAL_TABLET | Freq: Three times a day (TID) | ORAL | 0 refills | Status: DC | PRN

## 2022-04-06 NOTE — Telephone Encounter (Signed)
This patient was supposed to see Zella Ball today. Can you send in her refill for hydrocodone to [facility] on [address].

## 2022-04-08 ENCOUNTER — Inpatient Hospital Stay: Payer: Medicare Other

## 2022-04-14 ENCOUNTER — Encounter
Payer: Medicare Other | Attending: Physical Medicine and Rehabilitation | Admitting: Physical Medicine and Rehabilitation

## 2022-04-14 ENCOUNTER — Encounter: Payer: Self-pay | Admitting: Physical Medicine and Rehabilitation

## 2022-04-14 VITALS — Ht 63.0 in | Wt 86.8 lb

## 2022-04-14 DIAGNOSIS — G894 Chronic pain syndrome: Secondary | ICD-10-CM | POA: Diagnosis present

## 2022-04-14 DIAGNOSIS — K5901 Slow transit constipation: Secondary | ICD-10-CM | POA: Insufficient documentation

## 2022-04-14 MED ORDER — OXYCODONE-ACETAMINOPHEN 7.5-325 MG PO TABS: 1.0000 | ORAL_TABLET | Freq: Four times a day (QID) | ORAL | 0 refills | Status: DC | PRN
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
## 2022-04-14 NOTE — Patient Instructions (Signed)
Foods that may reduce pain: 1) Ginger (especially studied for arthritis)- reduce leukotriene production to decrease inflammation 2) Blueberries- high in phytonutrients that decrease inflammation 3) Salmon- marine omega-3s reduce joint swelling and pain 4) Pumpkin seeds- reduce inflammation 5) dark chocolate- reduces inflammation 6) turmeric- reduces inflammation 7) tart cherries - reduce pain and stiffness 8) extra virgin olive oil - its compound olecanthal helps to block prostaglandins  9) chili peppers- can be eaten or applied topically via capsaicin 10) mint- helpful for headache, muscle aches, joint pain, and itching 11) garlic- reduces inflammation  Link to further information on diet for chronic pain: [email or web address]   Constipation:  -Provided list of following foods that help with constipation and highlighted a few: 1) prunes- contain high amounts of fiber.  2) apples- has a form of dietary fiber called pectin that accelerates stool movement and increases beneficial gut bacteria 3) pears- in addition to fiber, also high in fructose and sorbitol which have laxative effect 4) figs- contain an enzyme ficin which helps to speed colonic transit 5) kiwis- contain an enzyme actinidin that improves gut motility and reduces constipation 6) oranges- rich in pectin (like apples) 7) grapefruits- contain a flavanol naringenin which has a laxative effect 8) vegetables- rich in fiber and also great sources of folate, vitamin C, and K 9) artichoke- high in inulin, prebiotic great for the microbiome 10) chicory- increases stool frequency and softness (can be added to coffee) 11) rhubarb- laxative effect 12) sweet potato- high fiber 13) beans, peas, and lentils- contain both soluble and insoluble fiber 14) chia seeds- improves intestinal health and gut flora 15) flaxseeds- laxative effect 16) whole grain rye bread- high  in fiber 17) oat bran- high in soluble and insoluble fiber 18) kefir- softens stools -recommended to try at least one of these foods every day.  -drink 6-8 glasses of water per day -walk regularly, especially after meals.

## 2022-04-22 ENCOUNTER — Inpatient Hospital Stay: Payer: Medicare Other | Attending: Hematology

## 2022-04-22 ENCOUNTER — Other Ambulatory Visit: Payer: Self-pay | Admitting: Physical Medicine and Rehabilitation

## 2022-04-22 DIAGNOSIS — D519 Vitamin B12 deficiency anemia, unspecified: Secondary | ICD-10-CM | POA: Diagnosis not present

## 2022-04-22 MED ORDER — CYANOCOBALAMIN 1000 MCG/ML IJ SOLN
1000.0000 ug | Freq: Once | INTRAMUSCULAR | Status: AC
  Administered 2022-04-22: 1000 ug via INTRAMUSCULAR
  Filled 2022-04-22: qty 1

## 2022-05-06 ENCOUNTER — Other Ambulatory Visit: Payer: Self-pay

## 2022-05-06 ENCOUNTER — Inpatient Hospital Stay: Payer: 59 | Attending: Hematology

## 2022-05-06 ENCOUNTER — Other Ambulatory Visit: Payer: Self-pay | Admitting: Gastroenterology

## 2022-05-06 DIAGNOSIS — D519 Vitamin B12 deficiency anemia, unspecified: Secondary | ICD-10-CM

## 2022-05-06 MED ORDER — CYANOCOBALAMIN 1000 MCG/ML IJ SOLN
1000.0000 ug | Freq: Once | INTRAMUSCULAR | Status: AC
  Administered 2022-05-06: 1000 ug via INTRAMUSCULAR
  Filled 2022-05-06: qty 1

## 2022-05-06 NOTE — Patient Instructions (Signed)
Vitamin B12 Deficiency Vitamin B12 deficiency means that your body does not have enough vitamin B12. The body needs this important vitamin: To make red blood cells. To make genes (DNA). To help the nerves work. If you do not have enough vitamin B12 in your body, you can have health problems, such as not having enough red blood cells in the blood (anemia). What are the causes? Not eating enough foods that contain vitamin B12. Not being able to take in (absorb) vitamin B12 from the food that you eat. Certain diseases. A condition in which the body does not make enough of a certain protein. This results in your body not taking in enough vitamin B12. Having a surgery in which part of the stomach or small intestine is taken out. Taking medicines that make it hard for the body to take in vitamin B12. These include: Heartburn medicines. Some medicines that are used to treat diabetes. What increases the risk? Being an older adult. Eating a vegetarian or vegan diet that does not include any foods that come from animals. Not eating enough foods that contain vitamin B12 while you are pregnant. Taking certain medicines. Having alcoholism. What are the signs or symptoms? In some cases, there are no symptoms. If the condition leads to too few blood cells or nerve damage, symptoms can occur, such as: Feeling weak or tired. Not being hungry. Losing feeling (numbness) or tingling in your hands and feet. Redness and burning of the tongue. Feeling sad (depressed). Confusion or memory problems. Trouble walking. If anemia is very bad, symptoms can include: Being short of breath. Being dizzy. Having a very fast heartbeat. How is this treated? Changing the way you eat and drink, such as: Eating more foods that contain vitamin B12. Drinking little or no alcohol. Getting vitamin B12 shots. Taking vitamin B12 supplements by mouth (orally). Your doctor will tell you the dose that is best for you. Follow  these instructions at home: Eating and drinking  Eat foods that come from animals and have a lot of vitamin B12 in them. These include: Meats and poultry. This includes beef, pork, chicken, turkey, and organ meats, such as liver. Seafood, such as clams, rainbow trout, salmon, tuna, and haddock. Eggs. Dairy foods such as milk, yogurt, and cheese. Eat breakfast cereals that have vitamin B12 added to them (are fortified). Check the label. The items listed above may not be a complete list of foods and beverages you can eat and drink. Contact a dietitian for more information. Alcohol use Do not drink alcohol if: Your doctor tells you not to drink. You are pregnant, may be pregnant, or are planning to become pregnant. If you drink alcohol: Limit how much you have to: 0-1 drink a day for women. 0-2 drinks a day for men. Know how much alcohol is in your drink. In the [place] one drink equals one 12 oz bottle of beer (355 mL), one 5 oz glass of wine (148 mL), or one 1 oz glass of hard liquor (44 mL). General instructions Get any vitamin B12 shots if told by your doctor. Take supplements only as told by your doctor. Follow the directions. Keep all follow-up visits. Contact a doctor if: Your symptoms come back. Your symptoms get worse or do not get better with treatment. Get help right away if: You have trouble breathing. You have a very fast heartbeat. You have chest pain. You get dizzy. You faint. These symptoms may be an emergency. Get help right away. Call 911.   Do not wait to see if the symptoms will go away. Do not drive yourself to the hospital. Summary Vitamin B12 deficiency means that your body is not getting enough of the vitamin. In some cases, there are no symptoms of this condition. Treatment may include making a change in the way you eat and drink, getting shots, or taking supplements. Eat foods that have vitamin B12 in them. This information is not intended to replace advice  given to you by your health care provider. Make sure you discuss any questions you have with your health care provider. Document Revised: 12/13/2020 Document Reviewed: 12/13/2020 Elsevier Patient Education  2023 Elsevier Inc.  

## 2022-05-07 ENCOUNTER — Encounter: Payer: 59 | Attending: Physical Medicine and Rehabilitation | Admitting: Registered Nurse

## 2022-05-07 ENCOUNTER — Encounter: Payer: Self-pay | Admitting: Registered Nurse

## 2022-05-07 VITALS — BP 171/81 | HR 100 | Ht 63.0 in | Wt 86.0 lb

## 2022-05-07 DIAGNOSIS — Z79891 Long term (current) use of opiate analgesic: Secondary | ICD-10-CM | POA: Diagnosis not present

## 2022-05-07 DIAGNOSIS — R1084 Generalized abdominal pain: Secondary | ICD-10-CM

## 2022-05-07 DIAGNOSIS — I10 Essential (primary) hypertension: Secondary | ICD-10-CM

## 2022-05-07 DIAGNOSIS — Z5181 Encounter for therapeutic drug level monitoring: Secondary | ICD-10-CM

## 2022-05-07 DIAGNOSIS — G894 Chronic pain syndrome: Secondary | ICD-10-CM

## 2022-05-07 MED ORDER — OXYCODONE-ACETAMINOPHEN 7.5-325 MG PO TABS: 1.0000 | ORAL_TABLET | Freq: Four times a day (QID) | ORAL | 0 refills | Status: DC | PRN
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
## 2022-05-12 DIAGNOSIS — T402X5A Adverse effect of other opioids, initial encounter: Secondary | ICD-10-CM | POA: Insufficient documentation

## 2022-05-12 DIAGNOSIS — R109 Unspecified abdominal pain: Secondary | ICD-10-CM | POA: Insufficient documentation

## 2022-05-14 ENCOUNTER — Encounter: Payer: Self-pay | Admitting: Hematology

## 2022-05-18 ENCOUNTER — Telehealth: Payer: Self-pay | Admitting: Hematology

## 2022-05-18 NOTE — Telephone Encounter (Signed)
Called patient to inform of changed appointment dates/times. Left voicemail with new appointment information

## 2022-05-20 ENCOUNTER — Telehealth: Payer: Self-pay | Admitting: Hematology

## 2022-05-20 ENCOUNTER — Inpatient Hospital Stay: Payer: 59

## 2022-05-20 ENCOUNTER — Inpatient Hospital Stay: Payer: 59 | Admitting: Physician Assistant

## 2022-05-20 NOTE — Telephone Encounter (Signed)
Patient called to r/s appointment. Expressed not feeling well. R/s patient appointment accordingly.

## 2022-05-22 ENCOUNTER — Inpatient Hospital Stay: Payer: 59 | Admitting: Physician Assistant

## 2022-05-22 ENCOUNTER — Inpatient Hospital Stay: Payer: 59

## 2022-06-06 ENCOUNTER — Other Ambulatory Visit: Payer: Self-pay | Admitting: Physical Medicine & Rehabilitation

## 2022-06-07 NOTE — Assessment & Plan Note (Signed)
pT2NxM0 -diagnosed in 08/2016, s/p appendectomy; this was felt to be sufficient given her age, comorbidities, and prior abdominal surgeries. She is currently on surveillance. -CU-64 PET on 08/11/21 was negative.  -CT AP on 11/11/21 showed: concerns for pneumonitis; ascending colitis vs nondistention with fluid throughout colon. -she is clinically stable

## 2022-06-07 NOTE — Assessment & Plan Note (Signed)
Secondary to gastric surgery   -anemia started 03/02/18 with hemoglobin 10.8, no anemia prior to. Her Iron study was WNL but on low end -she started monthly B12 injections at home in 05/2018, increased to every 2 weeks in 08/2018 given low response.  -Despite frequent B12 injection and normal B12 level, she still has moderate microcytic anemia. She has tried oral iron, but could not tolerate due to diarrhea. She received IV Venofer '300mg'$  on 11/22/20.  -she is receiving B12 injections here every 2 weeks. -anemia has resolved recently

## 2022-06-08 ENCOUNTER — Inpatient Hospital Stay: Payer: 59 | Attending: Hematology

## 2022-06-08 ENCOUNTER — Inpatient Hospital Stay (HOSPITAL_BASED_OUTPATIENT_CLINIC_OR_DEPARTMENT_OTHER): Payer: 59 | Admitting: Hematology

## 2022-06-08 ENCOUNTER — Encounter: Payer: Self-pay | Admitting: Hematology

## 2022-06-08 ENCOUNTER — Other Ambulatory Visit: Payer: Self-pay

## 2022-06-08 VITALS — BP 160/82 | HR 116 | Temp 98.7°F | Resp 18 | Ht 63.0 in | Wt 81.1 lb

## 2022-06-08 DIAGNOSIS — C7A8 Other malignant neuroendocrine tumors: Secondary | ICD-10-CM | POA: Diagnosis not present

## 2022-06-08 DIAGNOSIS — D519 Vitamin B12 deficiency anemia, unspecified: Secondary | ICD-10-CM | POA: Diagnosis not present

## 2022-06-08 DIAGNOSIS — D518 Other vitamin B12 deficiency anemias: Secondary | ICD-10-CM | POA: Diagnosis not present

## 2022-06-08 MED ORDER — CYANOCOBALAMIN 1000 MCG/ML IJ SOLN
1000.0000 ug | Freq: Once | INTRAMUSCULAR | Status: AC
  Administered 2022-06-08: 1000 ug via INTRAMUSCULAR
  Filled 2022-06-08: qty 1

## 2022-06-08 NOTE — Patient Instructions (Signed)
Vitamin B12 Deficiency Vitamin B12 deficiency means that your body does not have enough vitamin B12. The body needs this important vitamin: To make red blood cells. To make genes (DNA). To help the nerves work. If you do not have enough vitamin B12 in your body, you can have health problems, such as not having enough red blood cells in the blood (anemia). What are the causes? Not eating enough foods that contain vitamin B12. Not being able to take in (absorb) vitamin B12 from the food that you eat. Certain diseases. A condition in which the body does not make enough of a certain protein. This results in your body not taking in enough vitamin B12. Having a surgery in which part of the stomach or small intestine is taken out. Taking medicines that make it hard for the body to take in vitamin B12. These include: Heartburn medicines. Some medicines that are used to treat diabetes. What increases the risk? Being an older adult. Eating a vegetarian or vegan diet that does not include any foods that come from animals. Not eating enough foods that contain vitamin B12 while you are pregnant. Taking certain medicines. Having alcoholism. What are the signs or symptoms? In some cases, there are no symptoms. If the condition leads to too few blood cells or nerve damage, symptoms can occur, such as: Feeling weak or tired. Not being hungry. Losing feeling (numbness) or tingling in your hands and feet. Redness and burning of the tongue. Feeling sad (depressed). Confusion or memory problems. Trouble walking. If anemia is very bad, symptoms can include: Being short of breath. Being dizzy. Having a very fast heartbeat. How is this treated? Changing the way you eat and drink, such as: Eating more foods that contain vitamin B12. Drinking little or no alcohol. Getting vitamin B12 shots. Taking vitamin B12 supplements by mouth (orally). Your doctor will tell you the dose that is best for you. Follow  these instructions at home: Eating and drinking  Eat foods that come from animals and have a lot of vitamin B12 in them. These include: Meats and poultry. This includes beef, pork, chicken, turkey, and organ meats, such as liver. Seafood, such as clams, rainbow trout, salmon, tuna, and haddock. Eggs. Dairy foods such as milk, yogurt, and cheese. Eat breakfast cereals that have vitamin B12 added to them (are fortified). Check the label. The items listed above may not be a complete list of foods and beverages you can eat and drink. Contact a dietitian for more information. Alcohol use Do not drink alcohol if: Your doctor tells you not to drink. You are pregnant, may be pregnant, or are planning to become pregnant. If you drink alcohol: Limit how much you have to: 0-1 drink a day for women. 0-2 drinks a day for men. Know how much alcohol is in your drink. In the [place] one drink equals one 12 oz bottle of beer (355 mL), one 5 oz glass of wine (148 mL), or one 1 oz glass of hard liquor (44 mL). General instructions Get any vitamin B12 shots if told by your doctor. Take supplements only as told by your doctor. Follow the directions. Keep all follow-up visits. Contact a doctor if: Your symptoms come back. Your symptoms get worse or do not get better with treatment. Get help right away if: You have trouble breathing. You have a very fast heartbeat. You have chest pain. You get dizzy. You faint. These symptoms may be an emergency. Get help right away. Call 911.   Do not wait to see if the symptoms will go away. Do not drive yourself to the hospital. Summary Vitamin B12 deficiency means that your body is not getting enough of the vitamin. In some cases, there are no symptoms of this condition. Treatment may include making a change in the way you eat and drink, getting shots, or taking supplements. Eat foods that have vitamin B12 in them. This information is not intended to replace advice  given to you by your health care provider. Make sure you discuss any questions you have with your health care provider. Document Revised: 12/13/2020 Document Reviewed: 12/13/2020 Elsevier Patient Education  2023 Elsevier Inc.  
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
## 2022-06-09 ENCOUNTER — Telehealth: Payer: Self-pay | Admitting: Hematology

## 2022-06-09 NOTE — Telephone Encounter (Signed)
Contacted patient to scheduled appointments. Patient is aware of appointments that are scheduled.   

## 2022-06-10 ENCOUNTER — Other Ambulatory Visit: Payer: Self-pay | Admitting: Physical Medicine & Rehabilitation

## 2022-06-10 ENCOUNTER — Other Ambulatory Visit: Payer: Self-pay | Admitting: Physical Medicine and Rehabilitation

## 2022-06-17 ENCOUNTER — Ambulatory Visit: Payer: 59 | Admitting: Podiatry
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
## 2022-06-22 ENCOUNTER — Telehealth: Payer: Self-pay

## 2022-06-22 ENCOUNTER — Encounter: Payer: Self-pay | Admitting: Neurology

## 2022-06-22 ENCOUNTER — Ambulatory Visit (INDEPENDENT_AMBULATORY_CARE_PROVIDER_SITE_OTHER): Payer: 59 | Admitting: Neurology

## 2022-06-22 VITALS — BP 157/83 | HR 116 | Ht 63.0 in | Wt 82.0 lb

## 2022-06-22 DIAGNOSIS — F444 Conversion disorder with motor symptom or deficit: Secondary | ICD-10-CM | POA: Diagnosis not present

## 2022-06-22 DIAGNOSIS — R269 Unspecified abnormalities of gait and mobility: Secondary | ICD-10-CM

## 2022-06-22 NOTE — Telephone Encounter (Signed)
Emily [name] is out today and Emily [name] reports to have about 8 pills left and would like a refill last fill per pmp was 05/13/22  Filled  Written  ID  Drug  QTY  Days  Prescriber  RX #  Dispenser  Refill  Daily Dose*  Pymt Type  PMP  05/13/2022 05/07/2022 1  Oxycodone-Acetaminophn 7.5-325 120.00 30 Eu Tho [id number] [facility] ([id number]) 0/0 45.00 MME Medicare [place]

## 2022-06-22 NOTE — Patient Instructions (Signed)
I will order CT head, you can go today after visit to [facility] for testing  I will order home health to come out for physical therapy   Call for any worsening symptoms  See you back in 6 months

## 2022-06-23 ENCOUNTER — Other Ambulatory Visit: Payer: Self-pay | Admitting: Physical Medicine and Rehabilitation

## 2022-06-23 ENCOUNTER — Telehealth: Payer: Self-pay | Admitting: Neurology

## 2022-06-23 MED ORDER — OXYCODONE-ACETAMINOPHEN 7.5-325 MG PO TABS: 1.0000 | ORAL_TABLET | Freq: Four times a day (QID) | ORAL | 0 refills | Status: DC | PRN

## 2022-06-23 NOTE — Telephone Encounter (Signed)
[facility] is taking this patient

## 2022-06-23 NOTE — Telephone Encounter (Signed)
Done

## 2022-06-23 NOTE — Telephone Encounter (Signed)
[employer] medicare/[place] medicaid NPR sent to [facility] [phone]
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
## 2022-06-29 ENCOUNTER — Encounter: Payer: Self-pay | Admitting: Podiatry

## 2022-06-29 ENCOUNTER — Ambulatory Visit (INDEPENDENT_AMBULATORY_CARE_PROVIDER_SITE_OTHER): Payer: 59 | Admitting: Podiatry

## 2022-06-29 DIAGNOSIS — E0842 Diabetes mellitus due to underlying condition with diabetic polyneuropathy: Secondary | ICD-10-CM | POA: Diagnosis not present

## 2022-06-29 DIAGNOSIS — M79675 Pain in left toe(s): Secondary | ICD-10-CM

## 2022-06-29 DIAGNOSIS — B351 Tinea unguium: Secondary | ICD-10-CM

## 2022-06-29 DIAGNOSIS — M79674 Pain in right toe(s): Secondary | ICD-10-CM

## 2022-06-29 NOTE — Progress Notes (Signed)
This patient returns to my office for at risk foot care.  This patient requires this care by a professional since this patient will be at risk due to having  Diabetes.  This patient is unable to cut nails herself since the patient cannot reach her nails.These nails are painful walking and wearing shoes.  This patient presents for at risk foot care today.   General Appearance  Alert, conversant and in no acute stress.  Vascular  Dorsalis pedis and posterior tibial  pulses are  weakly palpable  bilaterally.  Capillary return is within normal limits  bilaterally. Temperature is within normal limits  bilaterally.  Neurologic  Senn-Weinstein monofilament wire test absent   bilaterally. Muscle power within normal limits bilaterally.  Nails Thick disfigured discolored nails with subungual debris  from hallux to fifth toes bilaterally. No evidence of bacterial infection or drainage bilaterally.  Orthopedic  No limitations of motion  feet .  No crepitus or effusions noted.  No bony pathology or digital deformities noted.  Skin  normotropic skin with no porokeratosis noted bilaterally.  No signs of infections or ulcers noted.    Onychomycosis  Pain in right toes  Pain in left toes  Dermatitis  Consent was obtained for treatment procedures.   Mechanical debridement of nails 1-5  bilaterally performed with a nail nipper.  Filed with dremel without incident.     Return office visit   3 months                   Told patient to return for periodic foot care and evaluation due to potential at risk complications.   Gardiner Barefoot DPM

## 2022-06-30 NOTE — Telephone Encounter (Signed)
[facility] ([facility]). Pt requesting to delay home health until March 5,2024. If this is ok with Judson Roch, NP. Can contact at [phone].

## 2022-07-01 NOTE — Telephone Encounter (Signed)
It is ok to delay home health to patients convenience.

## 2022-07-02 ENCOUNTER — Encounter: Payer: 59 | Admitting: Registered Nurse
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
## 2022-07-02 NOTE — Telephone Encounter (Signed)
Called and relayed message to [facility]. Patient is on schedule as of now for March 3rd, 2024

## 2022-07-03 ENCOUNTER — Other Ambulatory Visit: Payer: Self-pay

## 2022-07-03 DIAGNOSIS — C7A8 Other malignant neuroendocrine tumors: Secondary | ICD-10-CM

## 2022-07-06 ENCOUNTER — Inpatient Hospital Stay: Payer: 59

## 2022-07-06 ENCOUNTER — Inpatient Hospital Stay: Payer: 59 | Attending: Hematology

## 2022-07-09 ENCOUNTER — Encounter: Payer: 59 | Attending: Physical Medicine and Rehabilitation | Admitting: Registered Nurse

## 2022-07-09 DIAGNOSIS — Z79891 Long term (current) use of opiate analgesic: Secondary | ICD-10-CM | POA: Insufficient documentation

## 2022-07-09 DIAGNOSIS — R1084 Generalized abdominal pain: Secondary | ICD-10-CM | POA: Insufficient documentation

## 2022-07-09 DIAGNOSIS — G894 Chronic pain syndrome: Secondary | ICD-10-CM | POA: Insufficient documentation

## 2022-07-09 DIAGNOSIS — I10 Essential (primary) hypertension: Secondary | ICD-10-CM | POA: Insufficient documentation

## 2022-07-09 DIAGNOSIS — Z5181 Encounter for therapeutic drug level monitoring: Secondary | ICD-10-CM | POA: Insufficient documentation

## 2022-07-10 ENCOUNTER — Other Ambulatory Visit: Payer: Self-pay | Admitting: Family

## 2022-07-10 ENCOUNTER — Ambulatory Visit
Admission: RE | Admit: 2022-07-10 | Discharge: 2022-07-10 | Disposition: A | Discharge status: Home / Self Care | Payer: 59 | Source: Ambulatory Visit | Attending: Family | Admitting: Family

## 2022-07-10 DIAGNOSIS — R109 Unspecified abdominal pain: Secondary | ICD-10-CM

## 2022-07-13 NOTE — Telephone Encounter (Signed)
Emily [name] called from [facility]. Requesting verbal order for PT for 1 time a week for 1 week then 2 times a week for 4 weeks, then 1 time a week for 4 more weeks.

## 2022-07-13 NOTE — Telephone Encounter (Signed)
I called and spoke to [facility] to provide verbal orders for patient.

## 2022-07-15 ENCOUNTER — Other Ambulatory Visit: Payer: Self-pay

## 2022-07-15 ENCOUNTER — Emergency Department (HOSPITAL_COMMUNITY): Payer: 59

## 2022-07-15 ENCOUNTER — Emergency Department (HOSPITAL_COMMUNITY)
Admission: EM | Admit: 2022-07-15 | Discharge: 2022-07-15 | Disposition: A | Discharge status: Home / Self Care | Payer: 59 | Attending: Emergency Medicine | Admitting: Emergency Medicine

## 2022-07-15 ENCOUNTER — Encounter (HOSPITAL_COMMUNITY): Payer: Self-pay

## 2022-07-15 DIAGNOSIS — J181 Lobar pneumonia, unspecified organism: Secondary | ICD-10-CM | POA: Insufficient documentation

## 2022-07-15 DIAGNOSIS — Z1152 Encounter for screening for COVID-19: Secondary | ICD-10-CM | POA: Insufficient documentation

## 2022-07-15 DIAGNOSIS — Z79899 Other long term (current) drug therapy: Secondary | ICD-10-CM | POA: Diagnosis not present

## 2022-07-15 DIAGNOSIS — R109 Unspecified abdominal pain: Secondary | ICD-10-CM | POA: Diagnosis not present

## 2022-07-15 DIAGNOSIS — E119 Type 2 diabetes mellitus without complications: Secondary | ICD-10-CM | POA: Insufficient documentation

## 2022-07-15 DIAGNOSIS — R059 Cough, unspecified: Secondary | ICD-10-CM | POA: Diagnosis present

## 2022-07-15 DIAGNOSIS — J189 Pneumonia, unspecified organism: Secondary | ICD-10-CM

## 2022-07-15 DIAGNOSIS — I10 Essential (primary) hypertension: Secondary | ICD-10-CM | POA: Insufficient documentation

## 2022-07-15 HISTORY — DX: Cerebral infarction, unspecified: I63.9

## 2022-07-15 LAB — LIPASE, BLOOD: Lipase: 28 U/L (ref 11–51)

## 2022-07-15 LAB — LACTIC ACID, PLASMA
Lactic Acid, Venous: 1.7 mmol/L (ref 0.5–1.9)
Lactic Acid, Venous: 1.6 mmol/L (ref 0.5–1.9)

## 2022-07-15 LAB — RESP PANEL BY RT-PCR (RSV, FLU A&B, COVID)  RVPGX2
Influenza A by PCR: NEGATIVE
Influenza B by PCR: NEGATIVE
Resp Syncytial Virus by PCR: NEGATIVE
SARS Coronavirus 2 by RT PCR: NEGATIVE

## 2022-07-15 LAB — CBC WITH DIFFERENTIAL/PLATELET
Abs Immature Granulocytes: 0.12 10*3/uL — ABNORMAL HIGH (ref 0.00–0.07)
Basophils Absolute: 0 10*3/uL (ref 0.0–0.1)
Basophils Relative: 0 %
Eosinophils Absolute: 0 10*3/uL (ref 0.0–0.5)
Eosinophils Relative: 0 %
HCT: 36.9 % (ref 36.0–46.0)
Hemoglobin: 12.7 g/dL (ref 12.0–15.0)
Immature Granulocytes: 1 %
Lymphocytes Relative: 3 %
Lymphs Abs: 0.7 10*3/uL (ref 0.7–4.0)
MCH: 26.3 pg (ref 26.0–34.0)
MCHC: 34.4 g/dL (ref 30.0–36.0)
MCV: 76.4 fL — ABNORMAL LOW (ref 80.0–100.0)
Monocytes Absolute: 0.5 10*3/uL (ref 0.1–1.0)
Monocytes Relative: 2 %
Neutro Abs: 20.6 10*3/uL — ABNORMAL HIGH (ref 1.7–7.7)
Neutrophils Relative %: 94 %
Platelets: 391 10*3/uL (ref 150–400)
RBC: 4.83 MIL/uL (ref 3.87–5.11)
RDW: 17.2 % — ABNORMAL HIGH (ref 11.5–15.5)
WBC: 22 10*3/uL — ABNORMAL HIGH (ref 4.0–10.5)
nRBC: 0 % (ref 0.0–0.2)

## 2022-07-15 LAB — COMPREHENSIVE METABOLIC PANEL
ALT: 22 U/L (ref 0–44)
AST: 28 U/L (ref 15–41)
Albumin: 2.6 g/dL — ABNORMAL LOW (ref 3.5–5.0)
Alkaline Phosphatase: 176 U/L — ABNORMAL HIGH (ref 38–126)
Anion gap: 10 (ref 5–15)
BUN: 6 mg/dL — ABNORMAL LOW (ref 8–23)
CO2: 27 mmol/L (ref 22–32)
Calcium: 8.7 mg/dL — ABNORMAL LOW (ref 8.9–10.3)
Chloride: 102 mmol/L (ref 98–111)
Creatinine, Ser: 1.15 mg/dL — ABNORMAL HIGH (ref 0.44–1.00)
GFR, Estimated: 48 mL/min — ABNORMAL LOW (ref >60)
Glucose, Bld: 133 mg/dL — ABNORMAL HIGH (ref 70–99)
Potassium: 3.4 mmol/L — ABNORMAL LOW (ref 3.5–5.1)
Sodium: 139 mmol/L (ref 135–145)
Total Bilirubin: 0.8 mg/dL (ref 0.3–1.2)
Total Protein: 7.3 g/dL (ref 6.5–8.1)

## 2022-07-15 LAB — TSH: TSH: 2.876 u[IU]/mL (ref 0.350–4.500)

## 2022-07-15 MED ORDER — FENTANYL CITRATE PF 50 MCG/ML IJ SOSY
25.0000 ug | PREFILLED_SYRINGE | Freq: Once | INTRAMUSCULAR | Status: AC
  Administered 2022-07-15: 25 ug via INTRAVENOUS
  Filled 2022-07-15: qty 1

## 2022-07-15 MED ORDER — SODIUM CHLORIDE 0.9 % IV BOLUS
1000.0000 mL | Freq: Once | INTRAVENOUS | Status: AC
  Administered 2022-07-15: 1000 mL via INTRAVENOUS

## 2022-07-15 MED ORDER — BENZONATATE 100 MG PO CAPS
100.0000 mg | ORAL_CAPSULE | Freq: Once | ORAL | Status: AC
  Administered 2022-07-15: 100 mg via ORAL
  Filled 2022-07-15: qty 1

## 2022-07-15 MED ORDER — ONDANSETRON HCL 4 MG PO TABS: 4.0000 mg | ORAL_TABLET | Freq: Four times a day (QID) | ORAL | 0 refills | Status: DC

## 2022-07-15 MED ORDER — AZITHROMYCIN 250 MG PO TABS
500.0000 mg | ORAL_TABLET | Freq: Once | ORAL | Status: AC
  Administered 2022-07-15: 500 mg via ORAL
  Filled 2022-07-15: qty 2

## 2022-07-15 MED ORDER — IOHEXOL 350 MG/ML SOLN
75.0000 mL | Freq: Once | INTRAVENOUS | Status: AC | PRN
  Administered 2022-07-15: 75 mL via INTRAVENOUS

## 2022-07-15 MED ORDER — DOXYCYCLINE HYCLATE 100 MG PO CAPS: 100.0000 mg | ORAL_CAPSULE | Freq: Two times a day (BID) | ORAL | 0 refills | Status: DC

## 2022-07-15 MED ORDER — AMOXICILLIN-POT CLAVULANATE 875-125 MG PO TABS: 1.0000 | ORAL_TABLET | Freq: Two times a day (BID) | ORAL | 0 refills | Status: DC

## 2022-07-15 MED ORDER — ONDANSETRON HCL 4 MG/2ML IJ SOLN
4.0000 mg | Freq: Once | INTRAMUSCULAR | Status: AC
  Administered 2022-07-15: 4 mg via INTRAVENOUS
  Filled 2022-07-15: qty 2

## 2022-07-15 MED ORDER — SODIUM CHLORIDE 0.9 % IV SOLN
1.0000 g | Freq: Once | INTRAVENOUS | Status: AC
  Administered 2022-07-15: 1 g via INTRAVENOUS
  Filled 2022-07-15: qty 10

## 2022-07-15 NOTE — ED Notes (Signed)
[place] IV blew. IV team consult to be placed again.

## 2022-07-15 NOTE — ED Notes (Signed)
Patient transported to CT 

## 2022-07-15 NOTE — Discharge Instructions (Addendum)
Return to the ED with any new or worsening signs or symptoms such as nausea, vomiting, fevers Please follow-up with patient PCP next week for reevaluation Please read attached guide concerning community-acquired pneumonia Please begin taking Augmentin and doxycycline for your community-acquired pneumonia.  Please follow directions. Please begin taking Zofran for 6 hours for nausea and vomiting.

## 2022-07-15 NOTE — ED Notes (Signed)
Pt aware of urine sample 

## 2022-07-15 NOTE — ED Notes (Signed)
Patient transported to X-ray 

## 2022-07-15 NOTE — ED Triage Notes (Signed)
Per EMS, Pt, from home, c/o generalized abdominal pain x 1 year, n/v x 1 month and recent weight loss.  Pt recently seen by PCP for same and blood done.  Additionally, Pt c/o cough and congestion x1.5 weeks.    Possible low hemoglobin.  Denies dark stools and bleeding.

## 2022-07-15 NOTE — ED Provider Notes (Signed)
  Physical Exam  BP (!) 161/81   Pulse 99   Temp 97.9 F (36.6 C)   Resp (!) 21   SpO2 95%   Physical Exam Vitals and nursing note reviewed.  Constitutional:      General: She is not in acute distress.    Appearance: She is well-developed.  HENT:     Head: Normocephalic and atraumatic.  Eyes:     Conjunctiva/sclera: Conjunctivae normal.  Cardiovascular:     Rate and Rhythm: Normal rate and regular rhythm.     Heart sounds: No murmur heard. Pulmonary:     Effort: Pulmonary effort is normal. No respiratory distress.     Breath sounds: Normal breath sounds.  Abdominal:     Palpations: Abdomen is soft.     Tenderness: There is generalized abdominal tenderness.  Musculoskeletal:        General: No swelling.     Cervical back: Neck supple.  Skin:    General: Skin is warm and dry.     Capillary Refill: Capillary refill takes less than 2 seconds.  Neurological:     Mental Status: She is alert.  Psychiatric:        Mood and Affect: Mood normal.     Procedures  Procedures  ED Course / MDM      Medical Decision Making Amount and/or Complexity of Data Reviewed Labs: ordered. Radiology: ordered.  Risk Prescription drug management.   Patient signed out at shift change pending CT abdomen pelvis and reassessment.  In short, this is an 82 year old female presents to the ED for evaluation of cough as well as abdominal pain.  Patient has chronic abdominal pain at baseline for which she takes 20 mg total of hydrocodone per day.  The patient states that she recently had nausea and vomiting.  Patient signed out to me pending reassessment of nausea and vomiting, p.o. fluid challenge, CT scan of her belly.  Patient CT scan unchanged from prior CT scans.  There are findings consistent with patient underlying pneumonia.  The patient is not hypoxic.  The patient has had no nausea or vomiting while she has been here.  The patient passed p.o. fluid challenge.  The patient was provided 25  mcg of fentanyl for continued abdominal pain and states that the fentanyl has helped ease the pain off.  Patient and patient daughter at bedside are agreeable to plan for the patient be discharged with CAP outpatient treatment and to follow-up with her PCP in the next 5 days for reassessment.  Patient daughter and patient were given return precautions and they voiced understanding.  The patient and the patient daughter were advised to have the patient begin taking Zofran every 6 hours to avoid any nausea or vomiting.  The patient and patient daughter were advised that if the patient does begin to have nausea and vomiting she will need to return to the ED for admission due to the fact that she would not be able to take her antibiotics by mouth.  Patient and patient daughter voiced understanding.  Patient and patient daughter had all their questions answered their satisfaction.  The patient is stable for discharge.  Patient case discussed with attending Dr. Dina Rich who voices agreement with plan of management.       Azucena Cecil, PA-C 07/15/22 1717    Horton, Alvin Critchley, DO 07/15/22 [id number]

## 2022-07-15 NOTE — ED Provider Notes (Signed)
[facility] Provider Note   CSN: [id number] Arrival date & time: 07/15/22  1040     History  Chief Complaint  Patient presents with   Abdominal Pain   Emesis    Emily [name] is a 82 y.o. female.  The history is provided by the patient, the EMS personnel and medical records. No language interpreter was used.     82 year old female with extensive past medical history which includes hypertension, pancreatitis, GERD, depression, history of DVT, kidney stone, anxiety, malignant neuroendocrine disorder, gastroparesis, gastric outlet obstruction, alcohol use along with prior surgical history which include Nissen fundoplasty, gastrojejunostomy, cardiac catheterization, thoracotomy, laparoscopic appendectomy brought here via EMS from home with complaints of weakness.  Patient report for more than a year she has had recurrent abdominal pain.  She is unable to fully describe the sensation of pain states that it feels uncomfortable.  For the past few days she also endorsed having nausea and vomiting and this morning daughter felt her vomit looks more bilious.  She also endorsed an ongoing cough that is nonproductive.  She does not endorse any fever chills denies any postprandial pain no shortness of breath no headache no cold symptoms no urinary symptoms.  She denies any recent medication changes.  Patient reports she is currently taking her pain medication at home without adequate relief.  She denies any recent alcohol use.  She smokes approximately half a pack daily.  Home Medications Prior to Admission medications   Medication Sig Start Date End Date Taking? Authorizing Provider  acetaminophen (TYLENOL) 325 MG tablet Take by mouth. 06/24/10   [provider]  acetaminophen (TYLENOL) 500 MG tablet Take 500 mg by mouth every 8 (eight) hours as needed (for pain.).    [provider]  amitriptyline (ELAVIL) 10 MG tablet TAKE 1 TABLET(10  MG) BY MOUTH AT BEDTIME 06/12/22   Raulkar, Clide Deutscher, MD  ammonium lactate (LAC-HYDRIN) 12 % cream Apply 1 Application topically as needed for dry skin. 03/16/22   Gardiner Barefoot, DPM  clotrimazole-betamethasone (LOTRISONE) cream Apply 1 application topically 2 (two) times daily as needed (rash).  02/07/18   [provider]  cyanocobalamin (,VITAMIN B-12,) 1000 MCG/ML injection Inject 1 mL (1,000 mcg total) into the muscle every 14 (fourteen) days. 12/27/18   Truitt Merle, MD  dicyclomine (BENTYL) 10 MG capsule TAKE 1 CAPSULE BY MOUTH EVERY 8 HOURS AS NEEDED 05/06/22   Mauri Pole, MD  diphenhydrAMINE (BENADRYL) 12.5 MG chewable tablet Chew 1 tablet (12.5 mg total) by mouth 4 (four) times daily as needed for allergies. 05/22/21   Raulkar, Clide Deutscher, MD  doxycycline (VIBRAMYCIN) 100 MG capsule Take 1 capsule (100 mg total) by mouth 2 (two) times daily. 11/11/21   Quintella Reichert, MD  DULoxetine (CYMBALTA) 20 MG capsule     [provider]  Ensure (ENSURE) SMARTSIG:8 Ounce(s) By Mouth Twice Daily 02/26/22   [provider]  esomeprazole ([place]) 20 MG capsule TAKE 1 CAPSULE(20 MG) BY MOUTH DAILY AS NEEDED FOR GERD 04/22/22   Kirsteins, Luanna Salk, MD  esomeprazole ([place]) 40 MG capsule  03/11/20   [provider]  gabapentin (NEURONTIN) 600 MG tablet     [provider]  HYDROcodone-acetaminophen ([place]) 7.5-325 MG tablet TAKE 1 TABLET BY MOUTH TWICE DAILY AS NEEDED FOR MODERATE PAIN    [provider]  HYDROcodone-acetaminophen (NORCO/VICODIN) 5-325 MG tablet TAKE 1 TABLET BY MOUTH TWICE DAILY AS NEEDED FOR MODERATE PAIN  [provider]  hydrocortisone 2.5 % cream APPLY RECTALLY TO THE AFFECTED AREA TWICE DAILY    [provider]  hydrOXYzine (ATARAX) 25 MG tablet Take 25 mg by mouth daily as needed. 02/03/22   [provider]  hydrOXYzine (ATARAX) 50 MG tablet Take by mouth. 03/02/20   [provider]  lidocaine  (LIDODERM) 5 % USE 1 PATCH EXTERNALLY ONE DAILY REMOVE AND DISCARD WITHIN 12 HOURS. 05/21/21   Mauri Pole, MD  lidocaine (XYLOCAINE) 2 % solution SMARTSIG:By Mouth 01/02/22   [provider]  linaclotide Rolan Lipa) 145 MCG CAPS capsule     [provider]  LORazepam (ATIVAN) 0.5 MG tablet Take by mouth. 06/24/10   [provider]  losartan (COZAAR) 25 MG tablet Take 25 mg by mouth daily. 05/22/20   [provider]  meclizine (ANTIVERT) 12.5 MG tablet TAKE 1 TABLET(12.5 MG) BY MOUTH THREE TIMES DAILY AS NEEDED FOR DIZZINESS 06/11/22   Raulkar, Clide Deutscher, MD  megestrol (MEGACE) 40 MG/ML suspension SHAKE LIQUID AND TAKE 10 ML(400 MG) BY MOUTH DAILY 06/24/21   Raulkar, Clide Deutscher, MD  Melatonin 3 MG CAPS Take 1 capsule (3 mg total) by mouth at bedtime. 06/23/21   Raulkar, Clide Deutscher, MD  meloxicam (MOBIC) 7.5 MG tablet Take 1 tablet by mouth daily.    [provider]  mirtazapine (REMERON) 15 MG tablet Take 1 tablet by mouth at bedtime.    [provider]  mirtazapine (REMERON) 30 MG tablet Take 30 mg by mouth at bedtime. 06/20/21   [provider]  MOTEGRITY 1 MG TABS Take by mouth. 01/27/22   [provider]  ondansetron (ZOFRAN) 4 MG tablet TAKE 1 TABLET BY MOUTH EVERY 6 TO 8 HOURS AS NEEDED FOR NAUSEA    [provider]  oxybutynin (DITROPAN XL) 5 MG 24 hr tablet Take 1 tablet (5 mg total) by mouth at bedtime. 05/22/21   Raulkar, Clide Deutscher, MD  oxyCODONE-acetaminophen (PERCOCET) 7.5-325 MG tablet Take 1 tablet by mouth every 6 (six) hours as needed for moderate pain. 05/07/22 05/07/23  Bayard Hugger, NP  oxyCODONE-acetaminophen (PERCOCET) 7.5-325 MG tablet Take 1 tablet by mouth every 6 (six) hours as needed for severe pain. 06/23/22 06/23/23  Raulkar, Clide Deutscher, MD  prednisoLONE acetate (PRED FORTE) 1 % ophthalmic suspension PLACE 1 DROP INTO RIGHT EYE TWICE DAILY FOR 2 WEEKS THEN ONCE A DAY FOR 1 WEEK THE STOP    [provider]  prochlorperazine (COMPAZINE) 10 MG tablet     [provider]  prochlorperazine (COMPAZINE) 5 MG tablet Take by mouth. 05/12/22   [provider]  promethazine (PHENERGAN) 12.5 MG tablet TAKE 1 TABLET(12.5 MG) BY MOUTH EVERY 4 HOURS AS NEEDED FOR NAUSEA OR VOMITING 04/22/22   Kirsteins, Luanna Salk, MD  ramelteon (ROZEREM) 8 MG tablet Take 8 mg by mouth at bedtime as needed. 11/13/21   [provider]  RESTASIS 0.05 % ophthalmic emulsion Place 1 drop into both eyes 2 (two) times daily. 04/02/16   [provider]  tiZANidine (ZANAFLEX) 2 MG tablet Take 1 tablet by mouth 2 (two) times daily as needed.    [provider]  tiZANidine (ZANAFLEX) 4 MG tablet     [provider]      Allergies    Patient has no known allergies.    Review of Systems   Review of Systems  All other systems reviewed and are negative.   Physical Exam Updated Vital  Signs BP (!) 161/81   Pulse 99   Temp 97.9 F (36.6 C)   Resp (!) 21   SpO2 95%  Physical Exam Vitals and nursing note reviewed.  Constitutional:      General: She is not in acute distress.    Appearance: She is well-developed.     Comments: Frail-appearing elderly female laying in bed appears to be in no acute discomfort.  Occasional cough noted.  HENT:     Head: Atraumatic.  Eyes:     Conjunctiva/sclera: Conjunctivae normal.  Pulmonary:     Effort: Pulmonary effort is normal.     Comments: Diminished sounds to her lungs left greater than right no wheezes rales or rhonchi heard Abdominal:     Palpations: Abdomen is soft.     Tenderness: There is abdominal tenderness (Mild diffuse abdominal tenderness no guarding or rebound tenderness.  Bowel sounds present.).  Musculoskeletal:     Cervical back: Neck supple.     Comments: Able to move all 4 extremities with equal effort.  Skin:    Findings: No rash.  Neurological:     Mental Status: She is alert. Mental status is at  baseline.  Psychiatric:        Mood and Affect: Mood normal.     ED Results / Procedures / Treatments   Labs (all labs ordered are listed, but only abnormal results are displayed) Labs Reviewed  CBC WITH DIFFERENTIAL/PLATELET - Abnormal; Notable for the following components:      Result Value   WBC 22.0 (*)    MCV 76.4 (*)    RDW 17.2 (*)    Neutro Abs 20.6 (*)    Abs Immature Granulocytes 0.12 (*)    All other components within normal limits  COMPREHENSIVE METABOLIC PANEL - Abnormal; Notable for the following components:   Potassium 3.4 (*)    Glucose, Bld 133 (*)    BUN 6 (*)    Creatinine, Ser 1.15 (*)    Calcium 8.7 (*)    Albumin 2.6 (*)    Alkaline Phosphatase 176 (*)    GFR, Estimated 48 (*)    All other components within normal limits  RESP PANEL BY RT-PCR (RSV, FLU A&B, COVID)  [id number]  CULTURE, BLOOD (ROUTINE X 2)  CULTURE, BLOOD (ROUTINE X 2)  LIPASE, BLOOD  TSH  LACTIC ACID, PLASMA  URINALYSIS, ROUTINE W REFLEX MICROSCOPIC  LACTIC ACID, PLASMA    EKG EKG Interpretation  Date/Time:  Wednesday July 15 2022 10:49:39 EDT Ventricular Rate:  116 PR Interval:  127 QRS Duration: 124 QT Interval:  356 QTC Calculation: 495 R Axis:   -82 Text Interpretation: Sinus tachycardia Right bundle branch block Inferior infarct, old Since prior ECG< rate has increased Confirmed by Gareth Morgan [phone]) on 07/15/2022 10:53:01 AM  Radiology DG Chest 2 View  Result Date: 07/15/2022 CLINICAL DATA:  Cough and congestion for 1.5 weeks. EXAM: CHEST - 2 VIEW COMPARISON:  06/19/2017 FINDINGS: Infiltrate in the right upper lobe which is new from prior. Lung volumes are low. Normal heart size and mediastinal contours for technique. Artifact from EKG leads. IMPRESSION: Right upper lobe infiltrate usually from pneumonia. Followup PA and lateral chest X-ray is recommended in 3-4 weeks following trial of antibiotic therapy to ensure resolution. Electronically Signed   By: Jorje Guild M.D.   On: 07/15/2022 11:47    Procedures Procedures    Medications Ordered in ED Medications  sodium chloride 0.9 % bolus 1,000 mL (1,000 mLs Intravenous  New Bag/Given 07/15/22 1242)  ondansetron (ZOFRAN) injection 4 mg (4 mg Intravenous Given 07/15/22 1242)  cefTRIAXone (ROCEPHIN) 1 g in sodium chloride 0.9 % 100 mL IVPB (0 g Intravenous Stopped 07/15/22 1434)  azithromycin (ZITHROMAX) tablet 500 mg (500 mg Oral Given 07/15/22 1430)  benzonatate (TESSALON) capsule 100 mg (100 mg Oral Given 07/15/22 1358)  iohexol (OMNIPAQUE) 350 MG/ML injection 75 mL (75 mLs Intravenous Contrast Given 07/15/22 1526)    ED Course/ Medical Decision Making/ A&P Clinical Course as of 07/15/22 1528  Wed Jul 15, 2022  1506 Chronic abdominal pain for >1 year, maybe in last 1 month has worsened. Now has N/V, last BM yesterday evening. Hx abdominal surgery. Amoxicillin + doxycycline. If CTAP clean, can be discharged home with CAP treatment.  [CG]    Clinical Course User Index [CG] Azucena Cecil, PA-C                             Medical Decision Making Amount and/or Complexity of Data Reviewed Labs: ordered. Radiology: ordered.  Risk Prescription drug management.   BP 104/70   Pulse 80   Temp 97.9 F (36.6 C)   Resp 18   SpO2 95%   106:2 AM  82 year old female with extensive past medical history which includes hypertension, pancreatitis, GERD, depression, history of DVT, kidney stone, anxiety, malignant neuroendocrine disorder, gastroparesis, gastric outlet obstruction, alcohol use along with prior surgical history which include Nissen fundoplasty, gastrojejunostomy, cardiac catheterization, thoracotomy, laparoscopic appendectomy brought here via EMS from home with complaints of weakness.  Patient report for more than a year she has had recurrent abdominal pain.  She is unable to fully describe the sensation of pain states that it feels uncomfortable.  For the past few days she also endorsed  having nausea and vomiting and this morning daughter felt her vomit looks more bilious.  She also endorsed an ongoing cough that is nonproductive.  She does not endorse any fever chills denies any postprandial pain no shortness of breath no headache no cold symptoms no urinary symptoms.  She denies any recent medication changes.  Patient reports she is currently taking her pain medication at home without adequate relief.  She denies any recent alcohol use.  She smokes approximately half a pack daily.  On exam this is a elderly and frail appearing female laying in bed in no acute discomfort.  Occasional cough noted.  She does not have any nuchal rigidity.  She does have decreased lung sounds left greater than right.  Heart with tachycardia no murmur rubs or gallops abdomen diffusely tender without guarding or rebound tenderness, no CVA tenderness.  She is able to move all 4 extremities and follow commands appropriately.  Vital signs notable for tachycardia but no fever or hypoxia.  Patient has a fairly extensive medical history who is here with acute on chronic abdominal complaint.  Will initiate workup.  Nurse informed the patient is difficult to access IV, ultrasound IV was performed by me with success.  Will initiate treatment which include antibiotic.  EMERGENCY DEPARTMENT  US GUIDANCE EXAM Emergency Ultrasound:  US Guidance for Needle Guidance  INDICATIONS: Difficult vascular access Linear probe used in real-time to visualize location of needle entry through skin.   PERFORMED BY: Myself IMAGES ARCHIVED?: No LIMITATIONS: Pain VIEWS USED: Transverse INTERPRETATION: Needle visualized within vein, Right arm, and Needle gauge 18 R upper arm   -Labs ordered, independently viewed and interpreted by me.  Labs remarkable for WBC 22. Cr 1.15, normal lactic acid, negative covid/flu/rsv -The patient was maintained on a cardiac monitor.  I personally viewed and interpreted the cardiac monitored which  showed an underlying rhythm of: NSR -Imaging independently viewed and interpreted by me and I agree with radiologist's interpretation.  Result remarkable for CXR showing RUL pneumonia -This patient presents to the ED for concern of nausea, vomiting, this involves an extensive number of treatment options, and is a complaint that carries with it a high risk of complications and morbidity.  The differential diagnosis includes SBO, colitis, diverticular disease, pancreatiis, appendicitis, cholecystitis, pna -Co morbidities that complicate the patient evaluation includes DM, anemia, CAP, HTN -Treatment includes rocephin, zithromax, tessaslon, IVF, zofran -Reevaluation of the patient after these medicines showed that the patient improved -PCP office notes or outside notes reviewed -Discussion with oncoming provider who will f/u on CT result and determine disposition -Escalation to admission/observation considered: patient is being evaluate and determine disposition    12:28 PM Chest x-ray obtained intimately viewed interpreted by me and I agree with radiologist interpretation.  X-ray demonstrate right upper lobe infiltrate suspicious of pneumonia.  This patient does endorse a cough and overall not feeling well, suspect this is consistent with pneumonia.  Will treat with ceftriaxone and azithromycin.  Additional workup initiated.        Final Clinical Impression(s) / ED Diagnoses Final diagnoses:  Pneumonia of right upper lobe due to infectious organism    Rx / DC Orders ED Discharge Orders     None         Domenic Moras, PA-C 07/15/22 1534    Gareth Morgan, MD 07/15/22 [phone]

## 2022-07-15 NOTE — ED Notes (Signed)
ED Provider at bedside. 

## 2022-07-15 NOTE — ED Notes (Signed)
Unable to obtain IV at this time. IV team consult placed.

## 2022-07-20 ENCOUNTER — Emergency Department (HOSPITAL_COMMUNITY)
Admission: EM | Admit: 2022-07-20 | Discharge: 2022-07-20 | Disposition: A | Discharge status: Home / Self Care | Payer: 59 | Attending: Emergency Medicine | Admitting: Emergency Medicine

## 2022-07-20 ENCOUNTER — Other Ambulatory Visit: Payer: Self-pay

## 2022-07-20 ENCOUNTER — Encounter (HOSPITAL_COMMUNITY): Payer: Self-pay

## 2022-07-20 ENCOUNTER — Emergency Department (HOSPITAL_COMMUNITY): Payer: 59

## 2022-07-20 DIAGNOSIS — F17210 Nicotine dependence, cigarettes, uncomplicated: Secondary | ICD-10-CM | POA: Insufficient documentation

## 2022-07-20 DIAGNOSIS — E119 Type 2 diabetes mellitus without complications: Secondary | ICD-10-CM | POA: Diagnosis not present

## 2022-07-20 DIAGNOSIS — R112 Nausea with vomiting, unspecified: Secondary | ICD-10-CM | POA: Insufficient documentation

## 2022-07-20 DIAGNOSIS — Z8673 Personal history of transient ischemic attack (TIA), and cerebral infarction without residual deficits: Secondary | ICD-10-CM | POA: Insufficient documentation

## 2022-07-20 DIAGNOSIS — I10 Essential (primary) hypertension: Secondary | ICD-10-CM | POA: Diagnosis not present

## 2022-07-20 DIAGNOSIS — Z79899 Other long term (current) drug therapy: Secondary | ICD-10-CM | POA: Diagnosis not present

## 2022-07-20 DIAGNOSIS — Z1152 Encounter for screening for COVID-19: Secondary | ICD-10-CM | POA: Diagnosis not present

## 2022-07-20 DIAGNOSIS — R630 Anorexia: Secondary | ICD-10-CM

## 2022-07-20 DIAGNOSIS — R109 Unspecified abdominal pain: Secondary | ICD-10-CM | POA: Diagnosis present

## 2022-07-20 LAB — CBC WITH DIFFERENTIAL/PLATELET
Abs Immature Granulocytes: 0.04 10*3/uL (ref 0.00–0.07)
Basophils Absolute: 0 10*3/uL (ref 0.0–0.1)
Basophils Relative: 0 %
Eosinophils Absolute: 0 10*3/uL (ref 0.0–0.5)
Eosinophils Relative: 0 %
HCT: 38.4 % (ref 36.0–46.0)
Hemoglobin: 12.8 g/dL (ref 12.0–15.0)
Immature Granulocytes: 0 %
Lymphocytes Relative: 12 %
Lymphs Abs: 1.2 10*3/uL (ref 0.7–4.0)
MCH: 26.2 pg (ref 26.0–34.0)
MCHC: 33.3 g/dL (ref 30.0–36.0)
MCV: 78.7 fL — ABNORMAL LOW (ref 80.0–100.0)
Monocytes Absolute: 0.4 10*3/uL (ref 0.1–1.0)
Monocytes Relative: 4 %
Neutro Abs: 8.4 10*3/uL — ABNORMAL HIGH (ref 1.7–7.7)
Neutrophils Relative %: 84 %
Platelets: 384 10*3/uL (ref 150–400)
RBC: 4.88 MIL/uL (ref 3.87–5.11)
RDW: 16.6 % — ABNORMAL HIGH (ref 11.5–15.5)
WBC: 10.1 10*3/uL (ref 4.0–10.5)
nRBC: 0 % (ref 0.0–0.2)

## 2022-07-20 LAB — COMPREHENSIVE METABOLIC PANEL
ALT: 17 U/L (ref 0–44)
AST: 29 U/L (ref 15–41)
Albumin: 2.8 g/dL — ABNORMAL LOW (ref 3.5–5.0)
Alkaline Phosphatase: 139 U/L — ABNORMAL HIGH (ref 38–126)
Anion gap: 10 (ref 5–15)
BUN: 13 mg/dL (ref 8–23)
CO2: 26 mmol/L (ref 22–32)
Calcium: 8.6 mg/dL — ABNORMAL LOW (ref 8.9–10.3)
Chloride: 102 mmol/L (ref 98–111)
Creatinine, Ser: 1.25 mg/dL — ABNORMAL HIGH (ref 0.44–1.00)
GFR, Estimated: 43 mL/min — ABNORMAL LOW (ref >60)
Glucose, Bld: 109 mg/dL — ABNORMAL HIGH (ref 70–99)
Potassium: 3.7 mmol/L (ref 3.5–5.1)
Sodium: 138 mmol/L (ref 135–145)
Total Bilirubin: 0.8 mg/dL (ref 0.3–1.2)
Total Protein: 7.6 g/dL (ref 6.5–8.1)

## 2022-07-20 LAB — URINALYSIS, ROUTINE W REFLEX MICROSCOPIC
Bacteria, UA: NONE SEEN
Bilirubin Urine: NEGATIVE
Glucose, UA: NEGATIVE mg/dL
Ketones, ur: NEGATIVE mg/dL
Nitrite: NEGATIVE
Protein, ur: NEGATIVE mg/dL
RBC / HPF: >50 RBC/hpf (ref 0–5)
Specific Gravity, Urine: 1.025 (ref 1.005–1.030)
pH: 7 (ref 5.0–8.0)

## 2022-07-20 LAB — CULTURE, BLOOD (ROUTINE X 2)
Culture: NO GROWTH
Culture: NO GROWTH
Special Requests: ADEQUATE
Special Requests: ADEQUATE

## 2022-07-20 LAB — LIPASE, BLOOD: Lipase: 28 U/L (ref 11–51)

## 2022-07-20 LAB — RESP PANEL BY RT-PCR (RSV, FLU A&B, COVID)  RVPGX2
Influenza A by PCR: NEGATIVE
Influenza B by PCR: NEGATIVE
Resp Syncytial Virus by PCR: NEGATIVE
SARS Coronavirus 2 by RT PCR: NEGATIVE

## 2022-07-20 MED ORDER — IOHEXOL 300 MG/ML  SOLN
80.0000 mL | Freq: Once | INTRAMUSCULAR | Status: AC | PRN
  Administered 2022-07-20: 80 mL via INTRAVENOUS

## 2022-07-20 MED ORDER — FAMOTIDINE IN NACL 20-0.9 MG/50ML-% IV SOLN
20.0000 mg | Freq: Once | INTRAVENOUS | Status: AC
  Administered 2022-07-20: 20 mg via INTRAVENOUS
  Filled 2022-07-20: qty 50

## 2022-07-20 MED ORDER — ONDANSETRON HCL 4 MG/2ML IJ SOLN
4.0000 mg | Freq: Once | INTRAMUSCULAR | Status: AC
  Administered 2022-07-20: 4 mg via INTRAVENOUS
  Filled 2022-07-20: qty 2

## 2022-07-20 MED ORDER — HYDROMORPHONE HCL 1 MG/ML IJ SOLN
0.5000 mg | Freq: Once | INTRAMUSCULAR | Status: AC
  Administered 2022-07-20: 0.5 mg via INTRAVENOUS
  Filled 2022-07-20: qty 1

## 2022-07-20 MED ORDER — SODIUM CHLORIDE 0.9 % IV BOLUS
1000.0000 mL | Freq: Once | INTRAVENOUS | Status: AC
  Administered 2022-07-20: 1000 mL via INTRAVENOUS

## 2022-07-20 NOTE — ED Provider Notes (Signed)
Patient seen after prior EDP.  Patient is adamant about discharge home.  Patient and patient's daughter understand need for close outpatient follow-up.  Strict return precautions given and understood.  Importance of close follow-up is repeatedly stressed.   Valarie Merino, MD 07/20/22 2127

## 2022-07-20 NOTE — Discharge Instructions (Signed)
Return for any problem.  ?

## 2022-07-20 NOTE — ED Notes (Signed)
Nurse Tech took her to the bathroom and didn't know a urine sample was needed. Dr is aware of the delay. I gave the patient a cup of water.

## 2022-07-20 NOTE — ED Triage Notes (Signed)
Recently d/c from [facility] for PNA. Pt is not able to eat anything without vomiting or having diarrhea for over a month. Pt reports sharp abdominal pain. Pt daughter states this was going on during recent admission, but proceeded after d/c.

## 2022-07-20 NOTE — ED Provider Notes (Signed)
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 hiatal hernia    small   History of kidney stones    Right nonobstructing   History of sinus tachycardia    Hydronephrosis    Hydroureter  Hypertension    Irritable bowel syndrome    Low back pain    Nausea    Pancreatitis    Pneumonia    Postural dizziness 07/05/2019   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[facility])    Pulmonary nodule, right    stable for 21 months, multiple CT's of chest last one 12/07    Right bundle branch block    Sleep apnea    no cpap   Small vessel disease, 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   Tension headache    Ulcer    Vitamin B 12 deficiency    history of      Dispostion: Disposition decision including need for hospitalization was considered, and patient disposition pending at time of sign out.    Final Clinical Impression(s) / ED Diagnoses Final diagnoses:  Anorexia     This chart was dictated using voice recognition software.  Despite best efforts to proofread,  errors can occur which can change the documentation meaning.    Jeanell Sparrow, DO 07/21/22 [phone]

## 2022-07-21 ENCOUNTER — Other Ambulatory Visit: Payer: Medicaid Other

## 2022-07-22 ENCOUNTER — Telehealth: Payer: Self-pay | Admitting: Neurology

## 2022-07-22 NOTE — Telephone Encounter (Signed)
Emily [name] from [facility] called wanting to inform the provider that she has not been able to get a hold of the pt to do the PT so she will have a missed visit this week.

## 2022-07-23 NOTE — Telephone Encounter (Signed)
Called Amanda from [facility]. Informed her that provider wants her to call pt daughter. She said she has been trying to get up with pt and her daughter with no luck. Stated she will try again today.

## 2022-07-24 ENCOUNTER — Encounter: Payer: 59 | Admitting: Registered Nurse

## 2022-07-27 IMAGING — DX DG WRIST COMPLETE 3+V*L*
4 series · 4 of 4 positions shown · non-contrast
Comparison: 10/25/2011

CLINICAL DATA: Fall.

EXAM:
LEFT WRIST - COMPLETE 3+ VIEW

[wrist pa (1 of 2)]
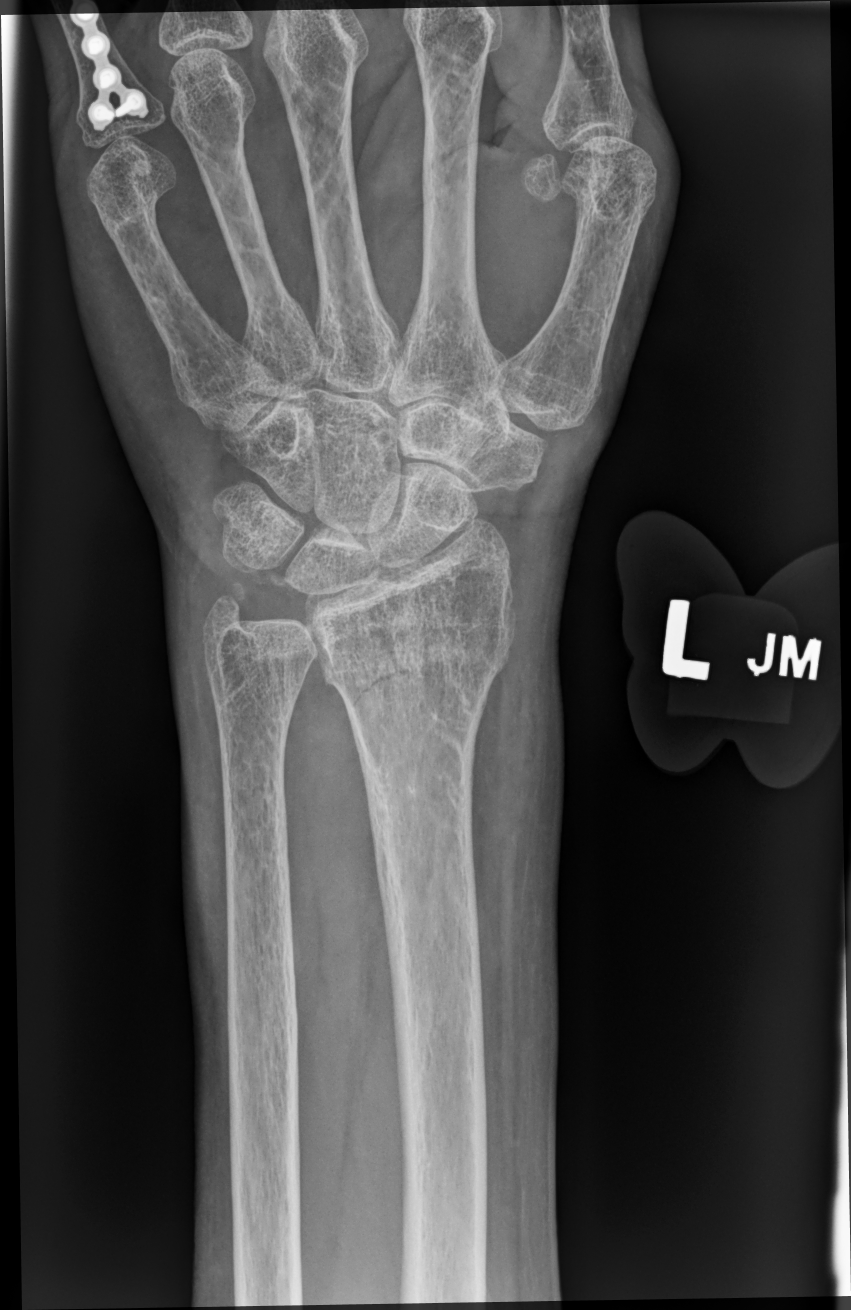

[wrist pa (2 of 2)]
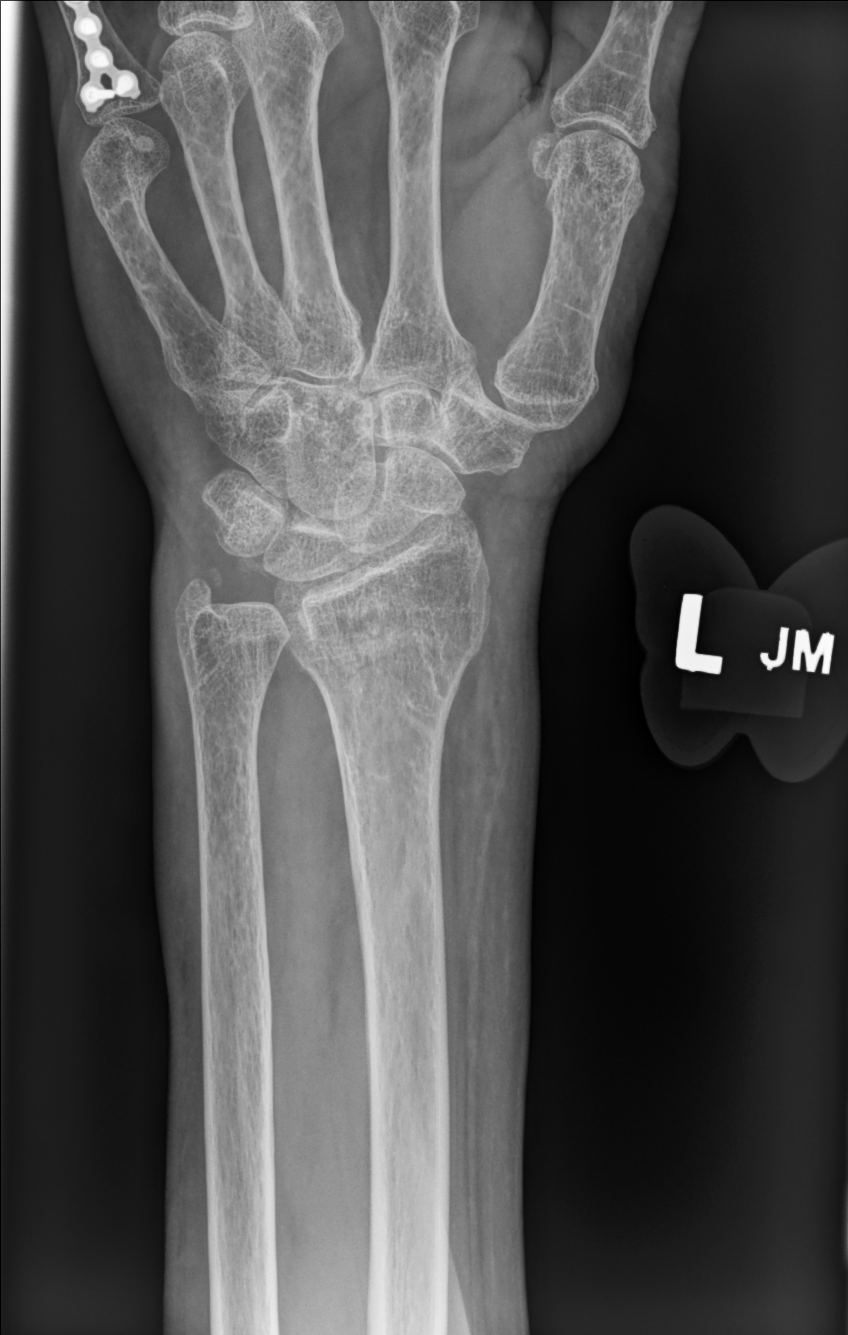

[wrist lat]
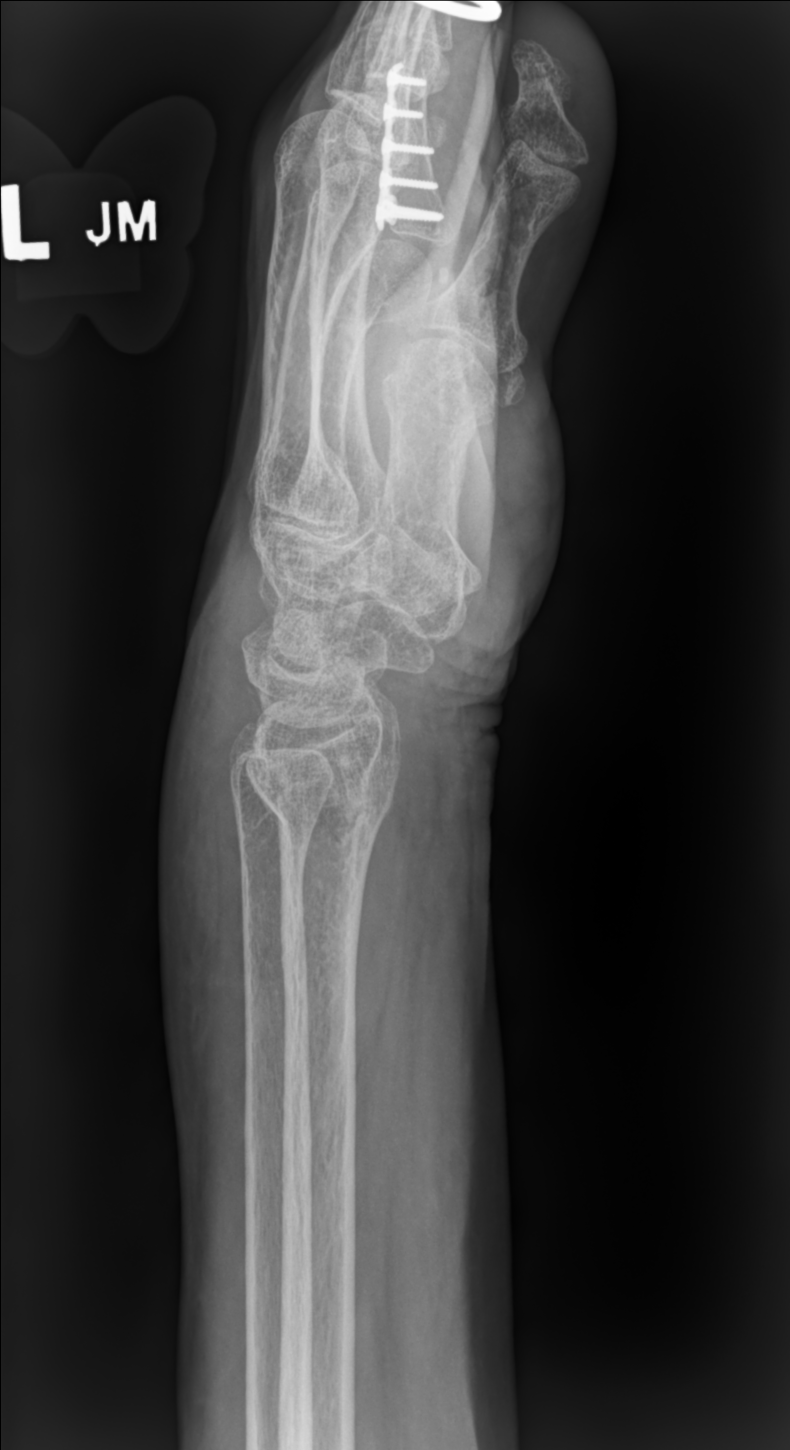

[scaphoid (os scaphoideum i) pa]
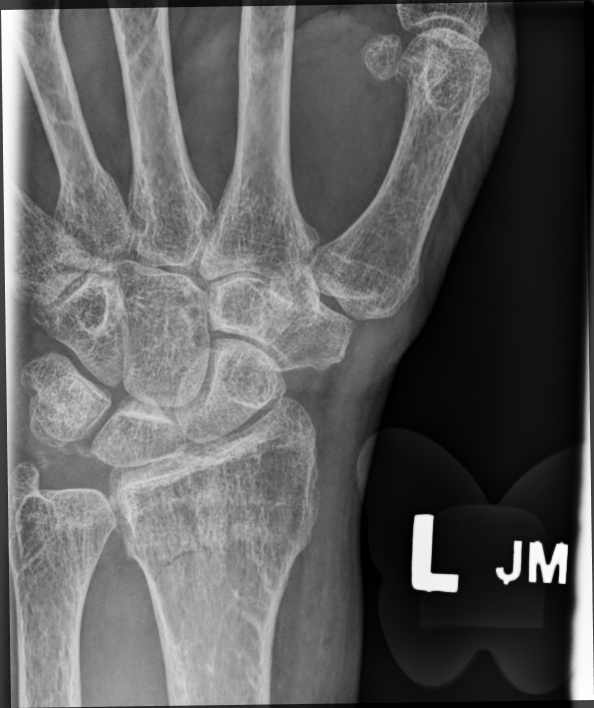

[4 of 4 positions shown; findings below may reference images not displayed]

FINDINGS: Nondisplaced transverse fracture distal radius bone the distal
diaphysis. No involvement of the wrist joint.

Small avulsion fracture of the tip of the ulnar styloid of
indeterminate age but not present 0154.

Mild degenerative change in the wrist joint. Plate fixation of fifth
proximal phalanx from prior fracture. Deformity of the fifth
metacarpal from chronic fracture.
IMPRESSION: Acute nondisplaced fracture distal radius.

Small avulsion fracture of the tip of the ulnar styloid of
indeterminate age but not present in 0154.

## 2022-07-30 ENCOUNTER — Telehealth: Payer: Self-pay

## 2022-07-30 NOTE — Telephone Encounter (Signed)
Faxed orders to [facility]  

## 2022-07-31 IMAGING — CT CT HEAD W/O CM
3 series · 14 of 47 positions shown, 16 images · non-contrast
Comparison: CT 11/07/2019

CLINICAL DATA: Mental status change, unknown cause

EXAM:
CT HEAD WITHOUT CONTRAST
TECHNIQUE: Contiguous axial images were obtained from the base of the skull
through the vertex without intravenous contrast.

[Series 2: head wo · axial · 0.47mm/px · z∈[+1166,+1291]mm · 8 of 30 slices shown, 10 images]
[im 3/30  brain]
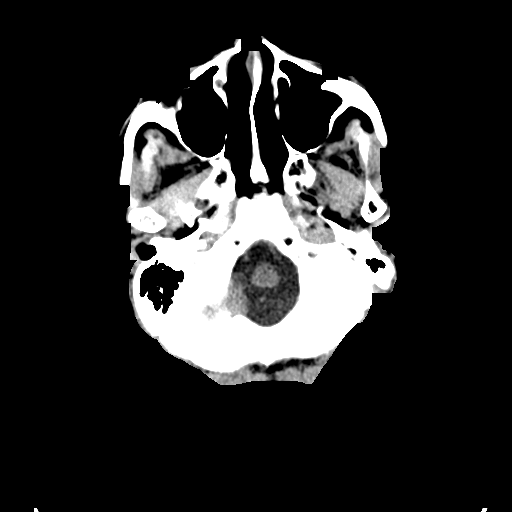
[im 3/30  bone]
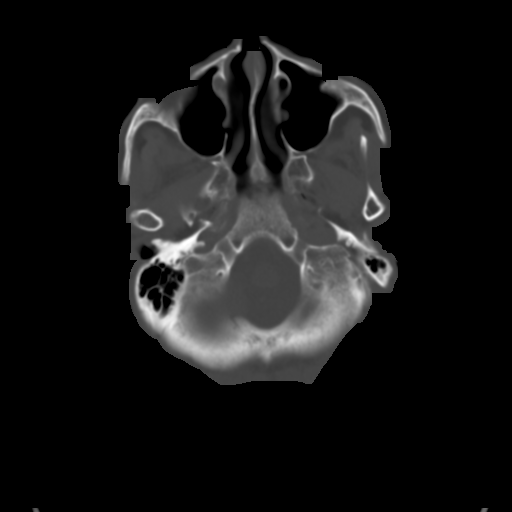
[im 7/30  brain]
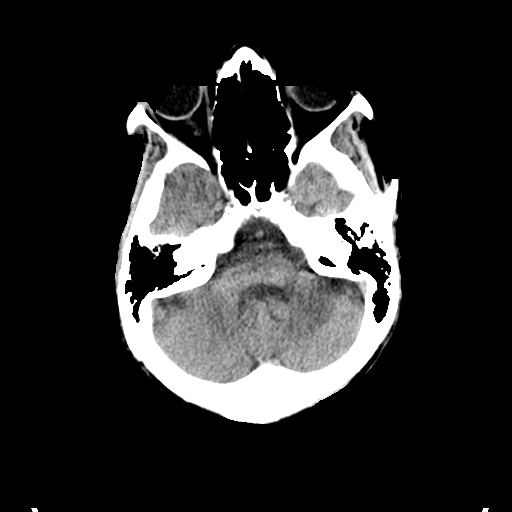
[im 10/30  brain]
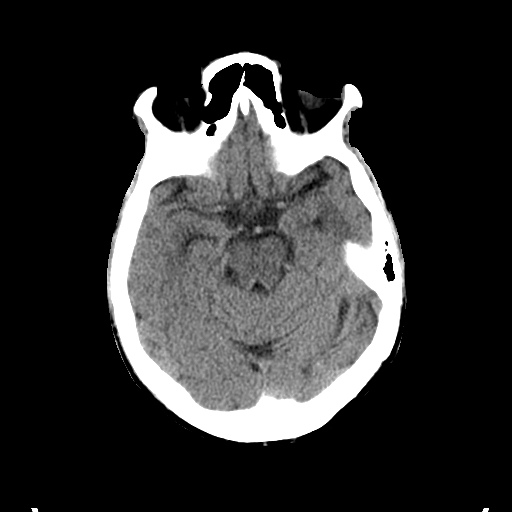
[im 14/30  brain]
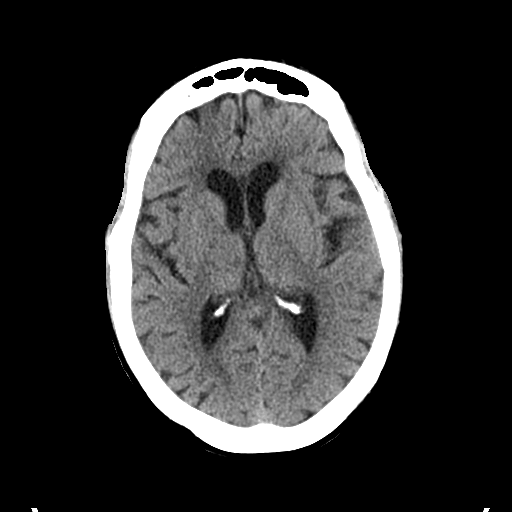
[im 17/30  brain]
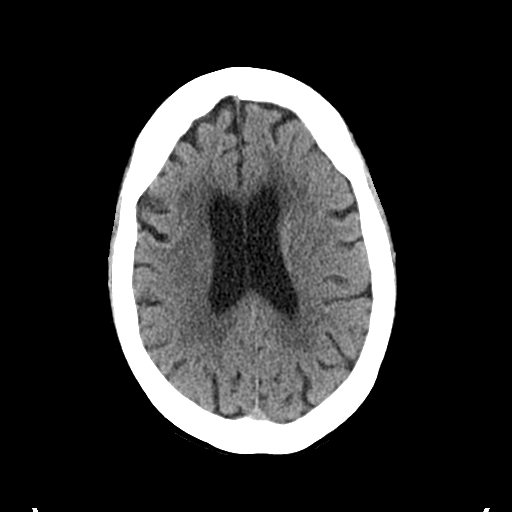
[im 17/30  bone]
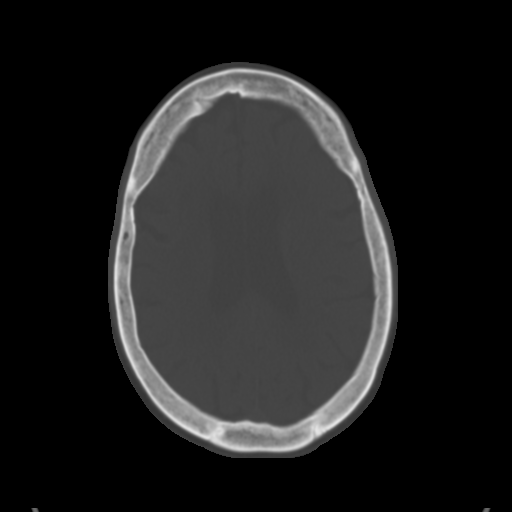
[im 21/30  brain]
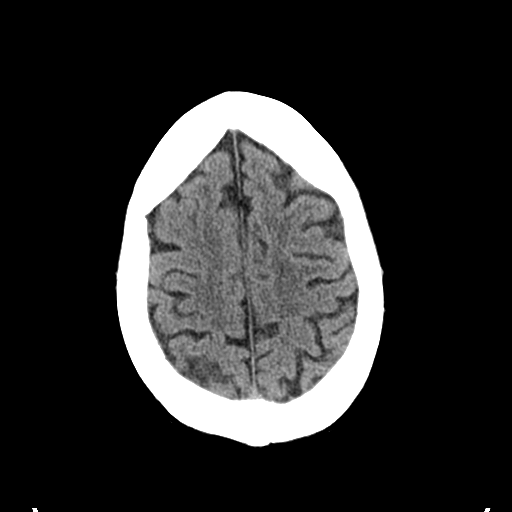
[im 24/30  brain]
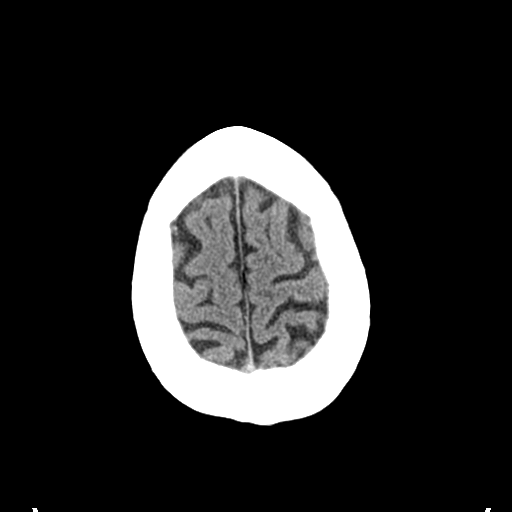
[im 28/30  brain]
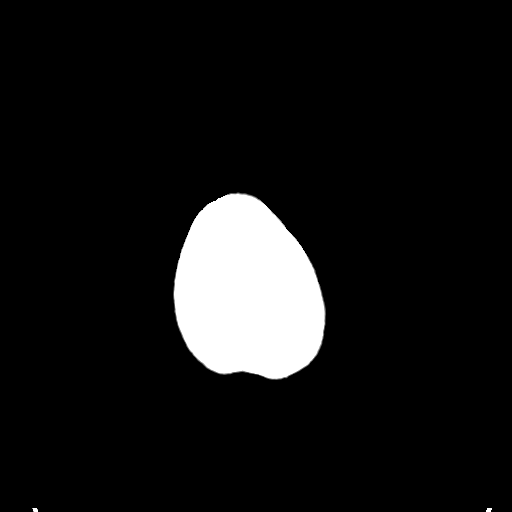

[Series 5: coronal soft tissue · coronal · 0.36mm/px · 3 of 62 slices shown]
[im 21/62  brain]
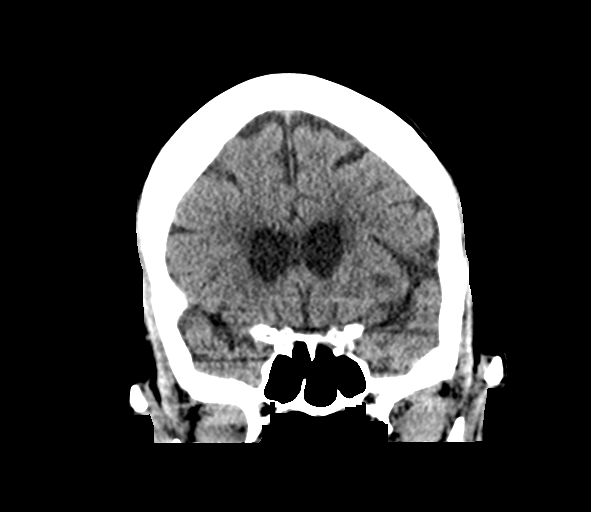
[im 28/62  brain]
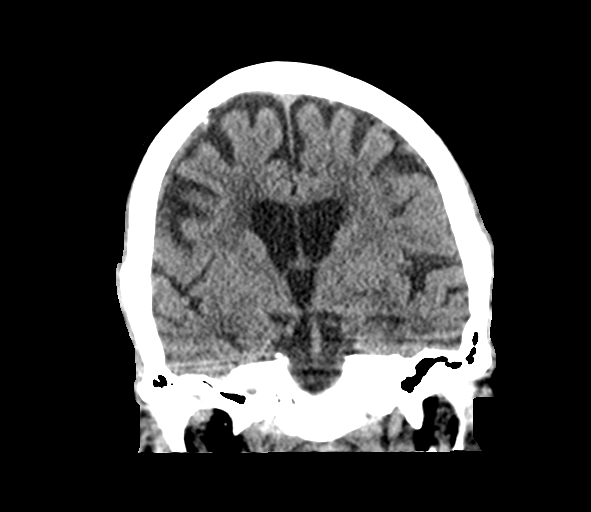
[im 34/62  brain]
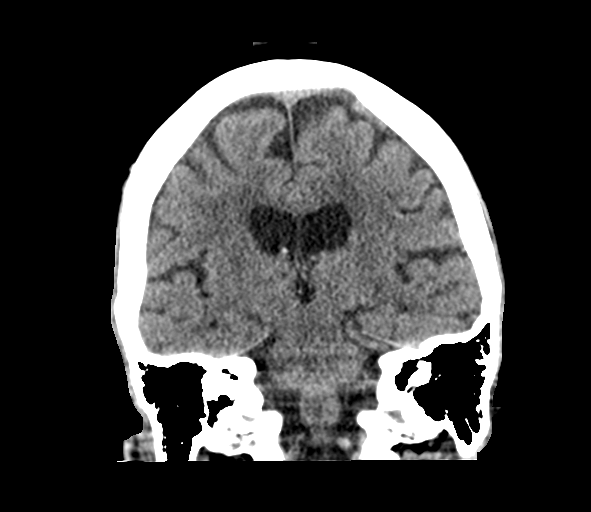

[Series 6: sagittal soft tissue · sagittal · 0.36mm/px · 3 of 50 slices shown]
[im 17/50  brain]
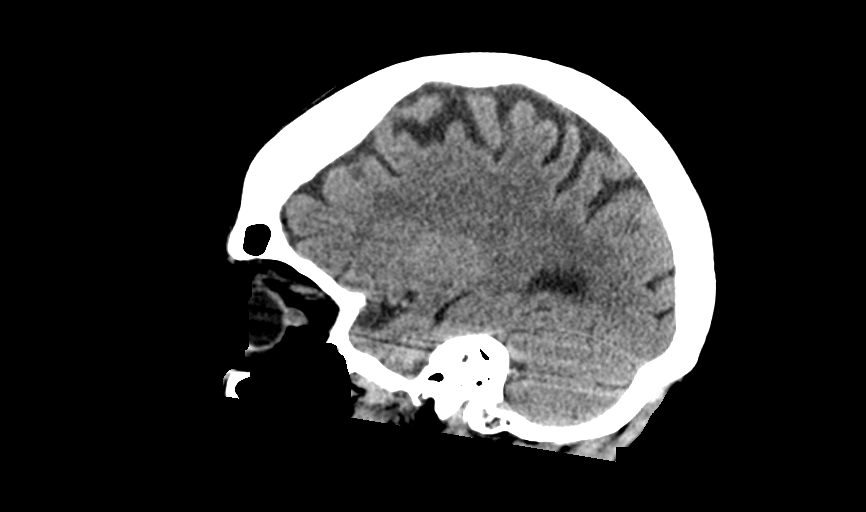
[im 25/50  brain]
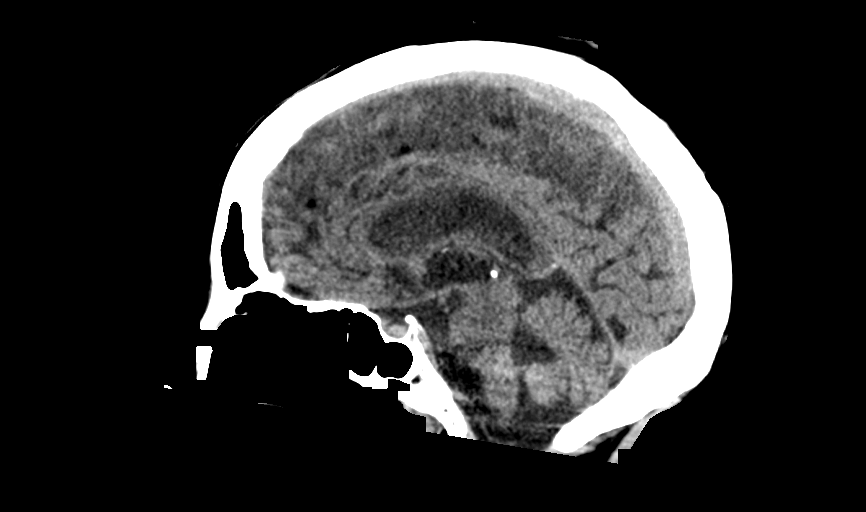
[im 33/50  brain]
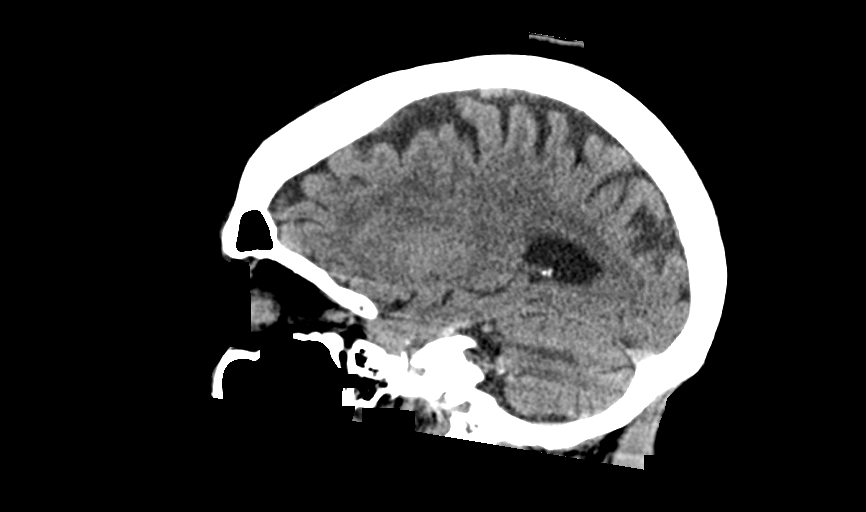

[14 of 47 positions shown; findings below may reference images not displayed]

FINDINGS: Brain: No evidence of acute intracranial hemorrhage or extra-axial
collection.No evidence of mass lesion/concern mass effect.The
ventricles are unchanged in size.Scattered subcortical and
periventricular white matter hypodensities, nonspecific but likely
sequela of chronic small vessel ischemic disease.

Vascular: No hyperdense vessel or unexpected calcification.

Skull: Normal. Negative for fracture or focal lesion.

Sinuses/Orbits: No acute finding.

Other: None.
IMPRESSION: No acute intracranial abnormality.

## 2022-08-03 ENCOUNTER — Telehealth: Payer: Self-pay | Admitting: *Deleted

## 2022-08-03 ENCOUNTER — Inpatient Hospital Stay: Payer: 59 | Attending: Hematology

## 2022-08-03 DIAGNOSIS — D519 Vitamin B12 deficiency anemia, unspecified: Secondary | ICD-10-CM | POA: Insufficient documentation

## 2022-08-03 MED ORDER — OXYCODONE-ACETAMINOPHEN 7.5-325 MG PO TABS: 1.0000 | ORAL_TABLET | Freq: Four times a day (QID) | ORAL | 0 refills | Status: DC | PRN

## 2022-08-03 NOTE — Telephone Encounter (Signed)
Emily [name] was admitted to [facility]  on 07/23/2022 and discharged on 07/27/2023.  She was admitted with Dehydration Weight Loss and Intractable Nausea and Vomiting.  Discharged Summary was reviewed.  Spoke with Emily [name] daughter she states Emily [name] pain not being controlled with current medication regimen. PMP was Reviewed. Last fill date of Oxycodone was 06/23/2022, she has a scheduled appointment with Dr [name] on 08/11/2022. According to above doesn't look like she is taking medication as prescribed.  We will continue Oxycodone and daughter states Emily [name] not having any adverse reaction with Oxycodone. Dr [name] will make any changes at the appointment, she verbalizes understanding.

## 2022-08-03 NOTE — Telephone Encounter (Signed)
Patient scheduled to see Dr. [name] on 08/11/22. Patient's daughter calling to request refill on Oxycodone. Per PMP last refill 06/23/22 pharmacy [facility].

## 2022-08-11 ENCOUNTER — Encounter: Payer: 59 | Attending: Physical Medicine and Rehabilitation | Admitting: Physical Medicine and Rehabilitation

## 2022-08-11 VITALS — BP 112/65 | HR 70 | Ht 63.0 in | Wt <= 1120 oz

## 2022-08-11 DIAGNOSIS — R1084 Generalized abdominal pain: Secondary | ICD-10-CM | POA: Insufficient documentation

## 2022-08-11 DIAGNOSIS — R109 Unspecified abdominal pain: Secondary | ICD-10-CM | POA: Diagnosis not present

## 2022-08-11 DIAGNOSIS — R1310 Dysphagia, unspecified: Secondary | ICD-10-CM | POA: Diagnosis not present

## 2022-08-11 DIAGNOSIS — G894 Chronic pain syndrome: Secondary | ICD-10-CM

## 2022-08-11 MED ORDER — OXYCODONE-ACETAMINOPHEN 7.5-325 MG PO TABS: 1.0000 | ORAL_TABLET | Freq: Two times a day (BID) | ORAL | 0 refills | Status: DC | PRN

## 2022-08-11 MED ORDER — GABAPENTIN 300 MG PO CAPS: 300.0000 mg | ORAL_CAPSULE | Freq: Three times a day (TID) | ORAL | 3 refills | Status: DC
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
## 2022-08-20 ENCOUNTER — Ambulatory Visit: Payer: 59 | Attending: Physical Medicine and Rehabilitation | Admitting: Speech Pathology

## 2022-08-20 ENCOUNTER — Other Ambulatory Visit: Payer: Self-pay

## 2022-08-20 ENCOUNTER — Encounter: Payer: Self-pay | Admitting: Speech Pathology

## 2022-08-20 DIAGNOSIS — R1310 Dysphagia, unspecified: Secondary | ICD-10-CM | POA: Insufficient documentation

## 2022-08-20 NOTE — Patient Instructions (Signed)
   ACID REFLUX PRECAUTIONS ACID REFLUX can be a possible cause of a voice disorder or throat irritation. Acid reflux is a disorder where acid from your stomach is abnormally spilled over onto your voice box after eating, during sleep, or even during singing. Acid reflux causes irritation and inflammation to your vocal folds and should be avoided and treated by changing eating habits, changing lifestyle, and taking medication (if prescribed by your doctor).   CHANGE EATING HABITS   Avoid "trigger" foods. Certain foods and drinks can trigger acid reflux.  1. Caffeine- in coffee, tea, chocolate, sodas  2. Carbonated beverages  3. Mint and menthol  4. Fatty/fried foods  5. Citrus fruits  6. Tomato products  7. Spicy foods  8. Alcohol    CHANGING LIFESTYLE HABITS   Drink 8 glasses of water per day (64oz)    Stop smoking   Avoid clearing your throat   Allow 3 hours between last big meal and going to bed at night   Keep yourself upright for one hour after you eat   Elevate the head of your bed using 6-inch blocks under the head of the bed or a bed wedge between the box spring and the mattress.   Eat small meals throughout the day rather than 3 big meals   Eat slowly   Wear loose clothing
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
## 2022-08-25 ENCOUNTER — Encounter: Payer: Self-pay | Admitting: Hematology

## 2022-08-25 ENCOUNTER — Encounter (HOSPITAL_BASED_OUTPATIENT_CLINIC_OR_DEPARTMENT_OTHER): Payer: 59 | Admitting: Physical Medicine and Rehabilitation

## 2022-08-25 ENCOUNTER — Other Ambulatory Visit: Payer: Self-pay

## 2022-08-25 DIAGNOSIS — R1084 Generalized abdominal pain: Secondary | ICD-10-CM

## 2022-08-26 MED ORDER — OXYCODONE-ACETAMINOPHEN 7.5-325 MG PO TABS: 1.0000 | ORAL_TABLET | Freq: Three times a day (TID) | ORAL | 0 refills | Status: DC | PRN
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
## 2022-08-31 ENCOUNTER — Inpatient Hospital Stay: Payer: 59

## 2022-08-31 ENCOUNTER — Inpatient Hospital Stay (HOSPITAL_BASED_OUTPATIENT_CLINIC_OR_DEPARTMENT_OTHER): Payer: 59 | Admitting: Hematology

## 2022-08-31 ENCOUNTER — Encounter: Payer: Self-pay | Admitting: Hematology

## 2022-08-31 ENCOUNTER — Other Ambulatory Visit: Payer: Self-pay

## 2022-08-31 VITALS — BP 135/70 | HR 102 | Temp 98.0°F | Resp 16 | Ht 63.0 in | Wt 76.3 lb

## 2022-08-31 DIAGNOSIS — C7A8 Other malignant neuroendocrine tumors: Secondary | ICD-10-CM

## 2022-08-31 DIAGNOSIS — D518 Other vitamin B12 deficiency anemias: Secondary | ICD-10-CM | POA: Diagnosis not present

## 2022-08-31 DIAGNOSIS — D519 Vitamin B12 deficiency anemia, unspecified: Secondary | ICD-10-CM | POA: Diagnosis present

## 2022-08-31 MED ORDER — MIRTAZAPINE 15 MG PO TABS: 15.0000 mg | ORAL_TABLET | Freq: Every day | ORAL | 0 refills | Status: DC

## 2022-08-31 MED ORDER — CYANOCOBALAMIN 1000 MCG/ML IJ SOLN
1000.0000 ug | Freq: Once | INTRAMUSCULAR | Status: AC
  Administered 2022-08-31: 1000 ug via INTRAMUSCULAR
  Filled 2022-08-31: qty 1

## 2022-08-31 NOTE — Assessment & Plan Note (Addendum)
pT2NxM0 -diagnosed in 08/2016, s/p appendectomy; this was felt to be sufficient given her age, comorbidities, and prior abdominal surgeries. She is currently on surveillance. -CU-64 PET on 08/11/21 was negative.  -CT AP on 11/11/21 showed: concerns for pneumonitis; ascending colitis vs nondistention with fluid throughout colon. -she is clinically stable, no clinical concern for recurrence. -She is 6 years out, and her risk of recurrence is lower now  -CT 07/20/2022 showed no evidence of recurrence, dilation of intrahepatic and extrahepatic bile ducts with interval worsening, I recommend GI evaluation
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
## 2022-08-31 NOTE — Patient Instructions (Signed)
Vitamin B12 Deficiency Vitamin B12 deficiency occurs when the body does not have enough of this important vitamin. The body needs this vitamin: To make red blood cells. To make DNA. This is the genetic material inside cells. To help the nerves work properly so they can carry messages from the brain to the body. Vitamin B12 deficiency can cause health problems, such as not having enough red blood cells in the blood (anemia). This can lead to nerve damage if untreated. What are the causes? This condition may be caused by: Not eating enough foods that contain vitamin B12. Not having enough stomach acid and digestive fluids to properly absorb vitamin B12 from the food that you eat. Having certain diseases that make it hard to absorb vitamin B12. These diseases include Crohn's disease, chronic pancreatitis, and cystic fibrosis. An autoimmune disorder in which the body does not make enough of a protein (intrinsic factor) within the stomach, resulting in not enough absorption of vitamin B12. Having a surgery in which part of the stomach or small intestine is removed. Taking certain medicines that make it hard for the body to absorb vitamin B12. These include: Heartburn medicines, such as antacids and proton pump inhibitors. Some medicines that are used to treat diabetes. What increases the risk? The following factors may make you more likely to develop a vitamin B12 deficiency: Being an older adult. Eating a vegetarian or vegan diet that does not include any foods that come from animals. Eating a poor diet while you are pregnant. Taking certain medicines. Having alcoholism. What are the signs or symptoms? In some cases, there are no symptoms of this condition. If the condition leads to anemia or nerve damage, various symptoms may occur, such as: Weakness. Tiredness (fatigue). Loss of appetite. Numbness or tingling in your hands and feet. Redness and burning of the tongue. Depression,  confusion, or memory problems. Trouble walking. If anemia is severe, symptoms can include: Shortness of breath. Dizziness. Rapid heart rate. How is this diagnosed? This condition may be diagnosed with a blood test to measure the level of vitamin B12 in your blood. You may also have other tests, including: A group of tests that measure certain characteristics of blood cells (complete blood count, CBC). A blood test to measure intrinsic factor. A procedure where a thin tube with a camera on the end is used to look into your stomach or intestines (endoscopy). Other tests may be needed to discover the cause of the deficiency. How is this treated? Treatment for this condition depends on the cause. This condition may be treated by: Changing your eating and drinking habits, such as: Eating more foods that contain vitamin B12. Drinking less alcohol or no alcohol. Getting vitamin B12 injections. Taking vitamin B12 supplements by mouth (orally). Your health care provider will tell you which dose is best for you. Follow these instructions at home: Eating and drinking  Include foods in your diet that come from animals and contain a lot of vitamin B12. These include: Meats and poultry. This includes beef, pork, chicken, turkey, and organ meats, such as liver. Seafood. This includes clams, rainbow trout, salmon, tuna, and haddock. Eggs. Dairy foods such as milk, yogurt, and cheese. Eat foods that have vitamin B12 added to them (are fortified), such as ready-to-eat breakfast cereals. Check the label on the package to see if a food is fortified. The items listed above may not be a complete list of foods and beverages you can eat and drink. Contact a dietitian for   more information. Alcohol use Do not drink alcohol if: Your health care provider tells you not to drink. You are pregnant, may be pregnant, or are planning to become pregnant. If you drink alcohol: Limit how much you have to: 0-1 drink a  day for women. 0-2 drinks a day for men. Know how much alcohol is in your drink. In the [place] one drink equals one 12 oz bottle of beer (355 mL), one 5 oz glass of wine (148 mL), or one 1 oz glass of hard liquor (44 mL). General instructions Get vitamin B12 injections if told to by your health care provider. Take supplements only as told by your health care provider. Follow the directions carefully. Keep all follow-up visits. This is important. Contact a health care provider if: Your symptoms come back. Your symptoms get worse or do not improve with treatment. Get help right away: You develop shortness of breath. You have a rapid heart rate. You have chest pain. You become dizzy or you faint. These symptoms may be an emergency. Get help right away. Call 911. Do not wait to see if the symptoms will go away. Do not drive yourself to the hospital. Summary Vitamin B12 deficiency occurs when the body does not have enough of this important vitamin. Common causes include not eating enough foods that contain vitamin B12, not being able to absorb vitamin B12 from the food that you eat, having a surgery in which part of the stomach or small intestine is removed, or taking certain medicines. Eat foods that have vitamin B12 in them. Treatment may include making a change in the way you eat and drink, getting vitamin B12 injections, or taking vitamin B12 supplements. This information is not intended to replace advice given to you by your health care provider. Make sure you discuss any questions you have with your health care provider. Document Revised: 12/13/2020 Document Reviewed: 12/13/2020 Elsevier Patient Education  2023 [employer] Inc.  

## 2022-08-31 NOTE — Assessment & Plan Note (Signed)
Secondary to gastric surgery   -anemia started 03/02/18 with hemoglobin 10.8, no anemia prior to. Her Iron study was WNL but on low end -she started monthly B12 injections at home in 05/2018, increased to every 2 weeks in 08/2018 given low response.  -Despite frequent B12 injection and normal B12 level, she still has moderate microcytic anemia. She has tried oral iron, but could not tolerate due to diarrhea. She received IV Venofer 300mg on 11/22/20.  -she is receiving B12 injections here every 2 weeks. -anemia has resolved recently  

## 2022-09-01 ENCOUNTER — Other Ambulatory Visit: Payer: Self-pay

## 2022-09-01 NOTE — Progress Notes (Signed)
Faxed Dr. Dorothyann Gibbs with [facility] Dr. Latanya Maudlin last office note per Dr. Latanya Maudlin request.  [phone]  Faxes: [phone] & [phone]).  Fax confirmation received.

## 2022-09-03 ENCOUNTER — Ambulatory Visit
Admission: EM | Admit: 2022-09-03 | Discharge: 2022-09-03 | Disposition: A | Discharge status: Other Acute Inpatient Hospital | Payer: 59 | Attending: Family Medicine | Admitting: Family Medicine

## 2022-09-03 ENCOUNTER — Encounter (HOSPITAL_COMMUNITY): Payer: Self-pay

## 2022-09-03 ENCOUNTER — Observation Stay (HOSPITAL_COMMUNITY)
Admission: EM | Admit: 2022-09-03 | Discharge: 2022-09-06 | Disposition: A | Discharge status: Home Health | Payer: 59 | Attending: Internal Medicine | Admitting: Internal Medicine

## 2022-09-03 ENCOUNTER — Emergency Department (HOSPITAL_COMMUNITY): Payer: 59

## 2022-09-03 ENCOUNTER — Other Ambulatory Visit: Payer: Self-pay

## 2022-09-03 DIAGNOSIS — Z86718 Personal history of other venous thrombosis and embolism: Secondary | ICD-10-CM | POA: Insufficient documentation

## 2022-09-03 DIAGNOSIS — K5900 Constipation, unspecified: Secondary | ICD-10-CM

## 2022-09-03 DIAGNOSIS — K922 Gastrointestinal hemorrhage, unspecified: Secondary | ICD-10-CM | POA: Diagnosis present

## 2022-09-03 DIAGNOSIS — E1140 Type 2 diabetes mellitus with diabetic neuropathy, unspecified: Secondary | ICD-10-CM | POA: Diagnosis not present

## 2022-09-03 DIAGNOSIS — I10 Essential (primary) hypertension: Secondary | ICD-10-CM | POA: Insufficient documentation

## 2022-09-03 DIAGNOSIS — F17210 Nicotine dependence, cigarettes, uncomplicated: Secondary | ICD-10-CM | POA: Diagnosis not present

## 2022-09-03 DIAGNOSIS — D72829 Elevated white blood cell count, unspecified: Secondary | ICD-10-CM | POA: Diagnosis present

## 2022-09-03 DIAGNOSIS — K219 Gastro-esophageal reflux disease without esophagitis: Secondary | ICD-10-CM | POA: Diagnosis present

## 2022-09-03 DIAGNOSIS — D62 Acute posthemorrhagic anemia: Secondary | ICD-10-CM | POA: Diagnosis present

## 2022-09-03 DIAGNOSIS — K625 Hemorrhage of anus and rectum: Secondary | ICD-10-CM | POA: Diagnosis present

## 2022-09-03 DIAGNOSIS — R32 Unspecified urinary incontinence: Secondary | ICD-10-CM | POA: Diagnosis present

## 2022-09-03 DIAGNOSIS — K5641 Fecal impaction: Secondary | ICD-10-CM | POA: Diagnosis present

## 2022-09-03 DIAGNOSIS — E119 Type 2 diabetes mellitus without complications: Secondary | ICD-10-CM

## 2022-09-03 LAB — CBC
HCT: 32.4 % — ABNORMAL LOW (ref 36.0–46.0)
Hemoglobin: 10.5 g/dL — ABNORMAL LOW (ref 12.0–15.0)
MCH: 25.5 pg — ABNORMAL LOW (ref 26.0–34.0)
MCHC: 32.4 g/dL (ref 30.0–36.0)
MCV: 78.6 fL — ABNORMAL LOW (ref 80.0–100.0)
Platelets: 385 10*3/uL (ref 150–400)
RBC: 4.12 MIL/uL (ref 3.87–5.11)
RDW: 13.4 % (ref 11.5–15.5)
WBC: 12.6 10*3/uL — ABNORMAL HIGH (ref 4.0–10.5)
nRBC: 0 % (ref 0.0–0.2)

## 2022-09-03 LAB — COMPREHENSIVE METABOLIC PANEL
ALT: 17 U/L (ref 0–44)
AST: 20 U/L (ref 15–41)
Albumin: 2.5 g/dL — ABNORMAL LOW (ref 3.5–5.0)
Alkaline Phosphatase: 111 U/L (ref 38–126)
Anion gap: 7 (ref 5–15)
BUN: 25 mg/dL — ABNORMAL HIGH (ref 8–23)
CO2: 26 mmol/L (ref 22–32)
Calcium: 8.3 mg/dL — ABNORMAL LOW (ref 8.9–10.3)
Chloride: 103 mmol/L (ref 98–111)
Creatinine, Ser: 0.7 mg/dL (ref 0.44–1.00)
GFR, Estimated: >60 mL/min (ref >60)
Glucose, Bld: 99 mg/dL (ref 70–99)
Potassium: 4.2 mmol/L (ref 3.5–5.1)
Sodium: 136 mmol/L (ref 135–145)
Total Bilirubin: 0.7 mg/dL (ref 0.3–1.2)
Total Protein: 6.7 g/dL (ref 6.5–8.1)

## 2022-09-03 LAB — POC OCCULT BLOOD, ED: Fecal Occult Bld: POSITIVE — AB

## 2022-09-03 LAB — LIPASE, BLOOD: Lipase: 25 U/L (ref 11–51)

## 2022-09-03 LAB — TYPE AND SCREEN
ABO/RH(D): O POS
Antibody Screen: NEGATIVE

## 2022-09-03 MED ORDER — MINERAL OIL RE ENEM
1.0000 | ENEMA | Freq: Once | RECTAL | Status: AC
  Administered 2022-09-04: 1 via RECTAL
  Filled 2022-09-03: qty 1

## 2022-09-03 MED ORDER — IOHEXOL 300 MG/ML  SOLN
80.0000 mL | Freq: Once | INTRAMUSCULAR | Status: AC | PRN
  Administered 2022-09-03: 80 mL via INTRAVENOUS

## 2022-09-03 NOTE — ED Provider Notes (Incomplete)
[place] EMERGENCY DEPARTMENT AT [facility] Provider Note   CSN: [id number] Arrival date & time: 09/03/22  1955     History {Add pertinent medical, surgical, social history, OB history to HPI:1} Chief Complaint  Patient presents with  . Rectal Bleeding  . Abdominal Pain    Emily [name] is a 82 y.o. female.  82 year old female with prior medical history as detailed below presents for evaluation.  Patient with rectal bleeding. Symptoms started today.   Arrives from [facility] for evaluation.  Bright red blood mixed with stool in patient's brief.   Patient reports mild low abdominal pain.  No vomiting, no fever.   The history is provided by the patient, a relative and medical records.  Abdominal Pain      Home Medications Prior to Admission medications   Medication Sig Start Date End Date Taking? Authorizing Provider  acetaminophen (TYLENOL) 325 MG tablet Take by mouth. 06/24/10   [provider]  acetaminophen (TYLENOL) 500 MG tablet Take 500 mg by mouth every 8 (eight) hours as needed (for pain.).    [provider]  amitriptyline (ELAVIL) 10 MG tablet TAKE 1 TABLET(10 MG) BY MOUTH AT BEDTIME 06/12/22   Raulkar, Drema Pry, MD  ammonium lactate (LAC-HYDRIN) 12 % cream Apply 1 Application topically as needed for dry skin. 03/16/22   Helane Gunther, DPM  clotrimazole-betamethasone (LOTRISONE) cream Apply 1 application topically 2 (two) times daily as needed (rash).  02/07/18   [provider]  cyanocobalamin (,VITAMIN B-12,) 1000 MCG/ML injection Inject 1 mL (1,000 mcg total) into the muscle every 14 (fourteen) days. 12/27/18   Malachy Mood, MD  dicyclomine (BENTYL) 10 MG capsule TAKE 1 CAPSULE BY MOUTH EVERY 8 HOURS AS NEEDED 05/06/22   Napoleon Form, MD  diphenhydrAMINE (BENADRYL) 12.5 MG chewable tablet Chew 1 tablet (12.5 mg total) by mouth 4 (four) times daily as needed for allergies. 05/22/21   Raulkar, Drema Pry, MD  DULoxetine (CYMBALTA)  20 MG capsule     [provider]  Ensure (ENSURE) SMARTSIG:8 Ounce(s) By Mouth Twice Daily 02/26/22   [provider]  esomeprazole (NEXIUM) 20 MG capsule TAKE 1 CAPSULE(20 MG) BY MOUTH DAILY AS NEEDED FOR GERD 04/22/22   Kirsteins, Victorino Sparrow, MD  esomeprazole (NEXIUM) 40 MG capsule  03/11/20   [provider]  gabapentin (NEURONTIN) 300 MG capsule Take 1 capsule (300 mg total) by mouth 3 (three) times daily. 08/11/22   Raulkar, Drema Pry, MD  hydrocortisone 2.5 % cream APPLY RECTALLY TO THE AFFECTED AREA TWICE DAILY    [provider]  hydrOXYzine (ATARAX) 25 MG tablet Take 25 mg by mouth daily as needed. 02/03/22   [provider]  hydrOXYzine (ATARAX) 50 MG tablet Take by mouth. 03/02/20   [provider]  lidocaine (LIDODERM) 5 % USE 1 PATCH EXTERNALLY ONE DAILY REMOVE AND DISCARD WITHIN 12 HOURS. 05/21/21   Napoleon Form, MD  lidocaine (XYLOCAINE) 2 % solution SMARTSIG:By Mouth 01/02/22   [provider]  linaclotide Karlene Einstein) 145 MCG CAPS capsule     [provider]  LORazepam (ATIVAN) 0.5 MG tablet Take by mouth. 06/24/10   [provider]  losartan (COZAAR) 25 MG tablet Take 25 mg by mouth daily. 05/22/20   [provider]  meclizine (ANTIVERT) 12.5 MG tablet TAKE 1 TABLET(12.5 MG) BY MOUTH THREE TIMES DAILY AS NEEDED FOR DIZZINESS 06/11/22   Raulkar, Drema Pry, MD  megestrol (MEGACE) 40 MG/ML suspension SHAKE LIQUID  AND TAKE 10 ML(400 MG) BY MOUTH DAILY 06/24/21   Raulkar, Drema Pry, MD  Melatonin 3 MG CAPS Take 1 capsule (3 mg total) by mouth at bedtime. 06/23/21   Raulkar, Drema Pry, MD  meloxicam (MOBIC) 7.5 MG tablet Take 1 tablet by mouth daily.    [provider]  mirtazapine (REMERON) 15 MG tablet Take 1 tablet (15 mg total) by mouth at bedtime. 08/31/22   Malachy Mood, MD  mirtazapine (REMERON) 30 MG tablet Take 30 mg by mouth at bedtime. 06/20/21   [provider]  MOTEGRITY 1 MG TABS  Take by mouth. 01/27/22   [provider]  ondansetron (ZOFRAN) 4 MG tablet Take 1 tablet (4 mg total) by mouth every 6 (six) hours. 07/15/22   Al Decant, PA-C  oxybutynin (DITROPAN XL) 5 MG 24 hr tablet Take 1 tablet (5 mg total) by mouth at bedtime. 05/22/21   Raulkar, Drema Pry, MD  oxyCODONE-acetaminophen (PERCOCET) 7.5-325 MG tablet Take 1 tablet by mouth every 8 (eight) hours as needed for moderate pain. 08/26/22 08/26/23  Raulkar, Drema Pry, MD  prednisoLONE acetate (PRED FORTE) 1 % ophthalmic suspension PLACE 1 DROP INTO RIGHT EYE TWICE DAILY FOR 2 WEEKS THEN ONCE A DAY FOR 1 WEEK THE STOP    [provider]  prochlorperazine (COMPAZINE) 10 MG tablet     [provider]  prochlorperazine (COMPAZINE) 5 MG tablet Take by mouth. 05/12/22   [provider]  promethazine (PHENERGAN) 12.5 MG tablet TAKE 1 TABLET(12.5 MG) BY MOUTH EVERY 4 HOURS AS NEEDED FOR NAUSEA OR VOMITING 04/22/22   Kirsteins, Victorino Sparrow, MD  ramelteon (ROZEREM) 8 MG tablet Take 8 mg by mouth at bedtime as needed. 11/13/21   [provider]  RESTASIS 0.05 % ophthalmic emulsion Place 1 drop into both eyes 2 (two) times daily. 04/02/16   [provider]  tiZANidine (ZANAFLEX) 2 MG tablet Take 1 tablet by mouth 2 (two) times daily as needed.    [provider]  tiZANidine (ZANAFLEX) 4 MG tablet     [provider]      Allergies    Patient has no known allergies.    Review of Systems   Review of Systems  All other systems reviewed and are negative.   Physical Exam Updated Vital Signs BP (!) 151/114   Pulse 75   Temp 98.2 F (36.8 C) (Oral)   Resp 18   Ht 5\' 3"  (1.6 m)   Wt 34.5 kg   SpO2 97%   BMI 13.46 kg/m  Physical Exam Vitals and nursing note reviewed.  Constitutional:      General: She is not in acute distress.    Appearance: Normal appearance. She is well-developed.  HENT:     Head: Normocephalic and atraumatic.  Eyes:      Conjunctiva/sclera: Conjunctivae normal.     Pupils: Pupils are equal, round, and reactive to light.  Cardiovascular:     Rate and Rhythm: Normal rate and regular rhythm.     Heart sounds: Normal heart sounds.  Pulmonary:     Effort: Pulmonary effort is normal. No respiratory distress.     Breath sounds: Normal breath sounds.  Abdominal:     General: There is no distension.     Palpations: Abdomen is soft.     Tenderness: There is no abdominal tenderness.  Genitourinary:    Comments: BRBPR mixed with dark, soft stool (likely melena) present on DRE. Musculoskeletal:  General: No deformity. Normal range of motion.     Cervical back: Normal range of motion and neck supple.  Skin:    General: Skin is warm and dry.  Neurological:     General: No focal deficit present.     Mental Status: She is alert and oriented to person, place, and time.     ED Results / Procedures / Treatments   Labs (all labs ordered are listed, but only abnormal results are displayed) Labs Reviewed  COMPREHENSIVE METABOLIC PANEL - Abnormal; Notable for the following components:      Result Value   BUN 25 (*)    Calcium 8.3 (*)    Albumin 2.5 (*)    All other components within normal limits  CBC - Abnormal; Notable for the following components:   WBC 12.6 (*)    Hemoglobin 10.5 (*)    HCT 32.4 (*)    MCV 78.6 (*)    MCH 25.5 (*)    All other components within normal limits  POC OCCULT BLOOD, ED - Abnormal; Notable for the following components:   Fecal Occult Bld POSITIVE (*)    All other components within normal limits  LIPASE, BLOOD  TYPE AND SCREEN    EKG None  Radiology No results found.  Procedures Procedures  {Document cardiac monitor, telemetry assessment procedure when appropriate:1}  Medications Ordered in ED Medications  iohexol (OMNIPAQUE) 300 MG/ML solution 80 mL (80 mLs Intravenous Contrast Given 09/03/22 2245)    ED Course/ Medical Decision Making/ A&P   {   Click  here for ABCD2, HEART and other calculatorsREFRESH Note before signing :1}                          Medical Decision Making Amount and/or Complexity of Data Reviewed Labs: ordered. Radiology: ordered.  Risk Prescription drug management.    Medical Screen Complete  This patient presented to the ED with complaint of rectal bleeding .  This complaint involves an extensive number of treatment options. The initial differential diagnosis includes, but is not limited to, gi bleeding   This presentation is: Acute, Chronic, Self-Limited, Previously Undiagnosed, Uncertain Prognosis, Complicated, Systemic Symptoms, and Threat to Life/Bodily Function   Patient with rectal bleeding times approximately 24 hours.  Patient with chronic abdominal pain, gastroparesis, IBS, previously seen by Shawn Stall MD ([facility]).   Co morbidities that complicated the patient's evaluation  ***   Additional history obtained:  Additional history obtained from {History source:19196::"EMS","Spouse","Family","Friend","Caregiver"} External records from outside sources obtained and reviewed including prior ED visits and prior Inpatient records.    Lab Tests:  I ordered and personally interpreted labs.  The pertinent results include:  ***   Imaging Studies ordered:  I ordered imaging studies including ***  I independently visualized and interpreted obtained imaging which showed *** I agree with the radiologist interpretation.   Cardiac Monitoring:  The patient was maintained on a cardiac monitor.  I personally viewed and interpreted the cardiac monitor which showed an underlying rhythm of: ***   Medicines ordered:  I ordered medication including ***  for ***  Reevaluation of the patient after these medicines showed that the patient: {resolved/improved/worsened:23923::"improved"}    Test Considered:  ***   Critical Interventions:  ***   Consultations Obtained:  I consulted  ***,  and discussed lab and imaging findings as well as pertinent plan of care.    Problem List / ED Course:  ***  Reevaluation:  After the interventions noted above, I reevaluated the patient and found that they have: {resolved/improved/worsened:23923::"improved"}   Social Determinants of Health:  ***   Disposition:  After consideration of the diagnostic results and the patients response to treatment, I feel that the patent would benefit from ***.    {Document critical care time when appropriate:1} {Document review of labs and clinical decision tools ie heart score, Chads2Vasc2 etc:1}  {Document your independent review of radiology images, and any outside records:1} {Document your discussion with family members, caretakers, and with consultants:1} {Document social determinants of health affecting pt's care:1} {Document your decision making why or why not admission, treatments were needed:1} Final Clinical Impression(s) / ED Diagnoses Final diagnoses:  None    Rx / DC Orders ED Discharge Orders     None
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
## 2022-09-03 NOTE — Discharge Instructions (Signed)
I have asked patient's daughter to proceed with her mom to the emergency room for urgent evaluation

## 2022-09-03 NOTE — ED Triage Notes (Addendum)
Patient reports rectal bleeding this evening, has been having issues with hemorrhoids the past two weeks. Has been seen by dr twice for this but they stated everything looked okay. Went to urgent care this evening and they sent her here. States they were changing brief and stool didn't look abnormal per daughter but noticed bright red blood when patient wiped and in toilet. Also endorses abd pain that has been going on for the majority of the day.

## 2022-09-03 NOTE — ED Notes (Signed)
Patient is being discharged from the Urgent Care and sent to the Emergency Department via POV . Per Dr. Marlinda Mike, patient is in need of higher level of care due to rectal bleeding. Patient is aware and verbalizes understanding of plan of care.  Vitals:   09/03/22 1920  BP: (!) 149/83  Pulse: 82  Resp: 16  Temp: 97.9 F (36.6 C)  SpO2: 97%

## 2022-09-03 NOTE — ED Notes (Signed)
Not able to get blood work. Pt stated that she usually needs an ultrasound IV.

## 2022-09-03 NOTE — ED Triage Notes (Signed)
Patient with c/o rectal bleeding after having episodes of diarrhea. Patient's daughter states she has chronic hemorrhoids and when she changed her she noticed a lot of bleeding. Patient states there was also blood in the toilet and dripping on her hand today when she tried to wipe.

## 2022-09-03 NOTE — ED Provider Notes (Signed)
[facility] Provider Note   CSN: [id number] Arrival date & time: 09/03/22  1955     History  Chief Complaint  Patient presents with   Rectal Bleeding   Abdominal Pain    Emily [name] is a 82 y.o. female.  82 year old female with prior medical history as detailed below presents for evaluation.  Patient with rectal bleeding. Symptoms started today.   Arrives from [facility] for evaluation.  Bright red blood mixed with stool in patient's brief.   Patient reports mild low abdominal pain.  No vomiting, no fever.   The history is provided by the patient, a relative and medical records.  Abdominal Pain      Home Medications Prior to Admission medications   Medication Sig Start Date End Date Taking? Authorizing Provider  acetaminophen (TYLENOL) 325 MG tablet Take by mouth. 06/24/10   [provider]  acetaminophen (TYLENOL) 500 MG tablet Take 500 mg by mouth every 8 (eight) hours as needed (for pain.).    [provider]  amitriptyline (ELAVIL) 10 MG tablet TAKE 1 TABLET(10 MG) BY MOUTH AT BEDTIME 06/12/22   Raulkar, Drema Pry, MD  ammonium lactate (LAC-HYDRIN) 12 % cream Apply 1 Application topically as needed for dry skin. 03/16/22   Helane Gunther, DPM  clotrimazole-betamethasone (LOTRISONE) cream Apply 1 application topically 2 (two) times daily as needed (rash).  02/07/18   [provider]  cyanocobalamin (,VITAMIN B-12,) 1000 MCG/ML injection Inject 1 mL (1,000 mcg total) into the muscle every 14 (fourteen) days. 12/27/18   Malachy Mood, MD  dicyclomine (BENTYL) 10 MG capsule TAKE 1 CAPSULE BY MOUTH EVERY 8 HOURS AS NEEDED 05/06/22   Napoleon Form, MD  diphenhydrAMINE (BENADRYL) 12.5 MG chewable tablet Chew 1 tablet (12.5 mg total) by mouth 4 (four) times daily as needed for allergies. 05/22/21   Raulkar, Drema Pry, MD  DULoxetine (CYMBALTA) 20 MG capsule     [provider]  Ensure (ENSURE) SMARTSIG:8  Ounce(s) By Mouth Twice Daily 02/26/22   [provider]  esomeprazole (NEXIUM) 20 MG capsule TAKE 1 CAPSULE(20 MG) BY MOUTH DAILY AS NEEDED FOR GERD 04/22/22   Kirsteins, Victorino Sparrow, MD  esomeprazole (NEXIUM) 40 MG capsule  03/11/20   [provider]  gabapentin (NEURONTIN) 300 MG capsule Take 1 capsule (300 mg total) by mouth 3 (three) times daily. 08/11/22   Raulkar, Drema Pry, MD  hydrocortisone 2.5 % cream APPLY RECTALLY TO THE AFFECTED AREA TWICE DAILY    [provider]  hydrOXYzine (ATARAX) 25 MG tablet Take 25 mg by mouth daily as needed. 02/03/22   [provider]  hydrOXYzine (ATARAX) 50 MG tablet Take by mouth. 03/02/20   [provider]  lidocaine (LIDODERM) 5 % USE 1 PATCH EXTERNALLY ONE DAILY REMOVE AND DISCARD WITHIN 12 HOURS. 05/21/21   Napoleon Form, MD  lidocaine (XYLOCAINE) 2 % solution SMARTSIG:By Mouth 01/02/22   [provider]  linaclotide Karlene Einstein) 145 MCG CAPS capsule     [provider]  LORazepam (ATIVAN) 0.5 MG tablet Take by mouth. 06/24/10   [provider]  losartan (COZAAR) 25 MG tablet Take 25 mg by mouth daily. 05/22/20   [provider]  meclizine (ANTIVERT) 12.5 MG tablet TAKE 1 TABLET(12.5 MG) BY MOUTH THREE TIMES DAILY AS NEEDED FOR DIZZINESS 06/11/22   Raulkar, Drema Pry, MD  megestrol (MEGACE) 40 MG/ML suspension SHAKE LIQUID AND TAKE 10 ML(400 MG) BY MOUTH DAILY 06/24/21  Horton Chin, MD  Melatonin 3 MG CAPS Take 1 capsule (3 mg total) by mouth at bedtime. 06/23/21   Raulkar, Drema Pry, MD  meloxicam (MOBIC) 7.5 MG tablet Take 1 tablet by mouth daily.    [provider]  mirtazapine (REMERON) 15 MG tablet Take 1 tablet (15 mg total) by mouth at bedtime. 08/31/22   Malachy Mood, MD  mirtazapine (REMERON) 30 MG tablet Take 30 mg by mouth at bedtime. 06/20/21   [provider]  MOTEGRITY 1 MG TABS Take by mouth. 01/27/22   [provider]  ondansetron (ZOFRAN)  4 MG tablet Take 1 tablet (4 mg total) by mouth every 6 (six) hours. 07/15/22   Al Decant, PA-C  oxybutynin (DITROPAN XL) 5 MG 24 hr tablet Take 1 tablet (5 mg total) by mouth at bedtime. 05/22/21   Raulkar, Drema Pry, MD  oxyCODONE-acetaminophen (PERCOCET) 7.5-325 MG tablet Take 1 tablet by mouth every 8 (eight) hours as needed for moderate pain. 08/26/22 08/26/23  Raulkar, Drema Pry, MD  prednisoLONE acetate (PRED FORTE) 1 % ophthalmic suspension PLACE 1 DROP INTO RIGHT EYE TWICE DAILY FOR 2 WEEKS THEN ONCE A DAY FOR 1 WEEK THE STOP    [provider]  prochlorperazine (COMPAZINE) 10 MG tablet     [provider]  prochlorperazine (COMPAZINE) 5 MG tablet Take by mouth. 05/12/22   [provider]  promethazine (PHENERGAN) 12.5 MG tablet TAKE 1 TABLET(12.5 MG) BY MOUTH EVERY 4 HOURS AS NEEDED FOR NAUSEA OR VOMITING 04/22/22   Kirsteins, Victorino Sparrow, MD  ramelteon (ROZEREM) 8 MG tablet Take 8 mg by mouth at bedtime as needed. 11/13/21   [provider]  RESTASIS 0.05 % ophthalmic emulsion Place 1 drop into both eyes 2 (two) times daily. 04/02/16   [provider]  tiZANidine (ZANAFLEX) 2 MG tablet Take 1 tablet by mouth 2 (two) times daily as needed.    [provider]  tiZANidine (ZANAFLEX) 4 MG tablet     [provider]      Allergies    Patient has no known allergies.    Review of Systems   Review of Systems  All other systems reviewed and are negative.   Physical Exam Updated Vital Signs BP (!) 151/114   Pulse 75   Temp 98.2 F (36.8 C) (Oral)   Resp 18   Ht 5\' 3"  (1.6 m)   Wt 34.5 kg   SpO2 97%   BMI 13.46 kg/m  Physical Exam Vitals and nursing note reviewed.  Constitutional:      General: She is not in acute distress.    Appearance: Normal appearance. She is well-developed.  HENT:     Head: Normocephalic and atraumatic.  Eyes:     Conjunctiva/sclera: Conjunctivae normal.     Pupils: Pupils are equal,  round, and reactive to light.  Cardiovascular:     Rate and Rhythm: Normal rate and regular rhythm.     Heart sounds: Normal heart sounds.  Pulmonary:     Effort: Pulmonary effort is normal. No respiratory distress.     Breath sounds: Normal breath sounds.  Abdominal:     General: There is no distension.     Palpations: Abdomen is soft.     Tenderness: There is no abdominal tenderness.  Genitourinary:    Comments: BRBPR mixed with dark, soft stool (likely melena) present on DRE. Musculoskeletal:        General: No deformity. Normal range of motion.  Cervical back: Normal range of motion and neck supple.  Skin:    General: Skin is warm and dry.  Neurological:     General: No focal deficit present.     Mental Status: She is alert and oriented to person, place, and time.     ED Results / Procedures / Treatments   Labs (all labs ordered are listed, but only abnormal results are displayed) Labs Reviewed  COMPREHENSIVE METABOLIC PANEL - Abnormal; Notable for the following components:      Result Value   BUN 25 (*)    Calcium 8.3 (*)    Albumin 2.5 (*)    All other components within normal limits  CBC - Abnormal; Notable for the following components:   WBC 12.6 (*)    Hemoglobin 10.5 (*)    HCT 32.4 (*)    MCV 78.6 (*)    MCH 25.5 (*)    All other components within normal limits  POC OCCULT BLOOD, ED - Abnormal; Notable for the following components:   Fecal Occult Bld POSITIVE (*)    All other components within normal limits  LIPASE, BLOOD  TYPE AND SCREEN    EKG None  Radiology No results found.  Procedures Procedures    Medications Ordered in ED Medications  iohexol (OMNIPAQUE) 300 MG/ML solution 80 mL (80 mLs Intravenous Contrast Given 09/03/22 2245)    ED Course/ Medical Decision Making/ A&P                             Medical Decision Making Amount and/or Complexity of Data Reviewed Labs: ordered. Radiology: ordered.  Risk Prescription drug  management.    Medical Screen Complete  This patient presented to the ED with complaint of rectal bleeding .  This complaint involves an extensive number of treatment options. The initial differential diagnosis includes, but is not limited to, gi bleeding   This presentation is: Acute, Chronic, Self-Limited, Previously Undiagnosed, Uncertain Prognosis, Complicated, Systemic Symptoms, and Threat to Life/Bodily Function   Patient with rectal bleeding times approximately 24 hours.  Patient endorsed pain.   Patient with chronic abdominal pain, gastroparesis, IBS, previously seen by Shawn Stall MD ([facility]).  Initial Hgb 10.5. Stable hemodynamics.  Large rectal stool ball on CT.  Rectal disimpaction completed by myself - moderate amount of stool removed easily. Enema ordered for after disimpaction.  Hospitalist service to admit for observation.   Message requesting GI consult in the morning sent to Dr. Leone Payor.    Additional history obtained:  Additional history obtained from [facility] External records from outside sources obtained and reviewed including prior ED visits and prior Inpatient records.    Lab Tests:  I ordered and personally interpreted labs.  The pertinent results include:  CBC CMP T and S lipase   Imaging Studies ordered:  I ordered imaging studies including CT AP  I independently visualized and interpreted obtained imaging which showed Large rectal stool I agree with the radiologist interpretation.   Cardiac Monitoring:  The patient was maintained on a cardiac monitor.  I personally viewed and interpreted the cardiac monitor which showed an underlying rhythm of: NSR   Problem List / ED Course:  Rectal bleeding, Constipation   Reevaluation:  After the interventions noted above, I reevaluated the patient and found that they have: improved   Disposition:  After consideration of the diagnostic results and the patients response to  treatment, I feel that the patent would  benefit from admission.          Final Clinical Impression(s) / ED Diagnoses Final diagnoses:  Constipation, unspecified constipation type  Rectal bleeding    Rx / DC Orders ED Discharge Orders     None         Wynetta Fines, MD 09/04/22 [phone]

## 2022-09-04 ENCOUNTER — Encounter (HOSPITAL_COMMUNITY): Payer: Self-pay | Admitting: Internal Medicine

## 2022-09-04 ENCOUNTER — Observation Stay (HOSPITAL_COMMUNITY): Payer: 59

## 2022-09-04 DIAGNOSIS — D62 Acute posthemorrhagic anemia: Secondary | ICD-10-CM | POA: Diagnosis not present

## 2022-09-04 DIAGNOSIS — D72829 Elevated white blood cell count, unspecified: Secondary | ICD-10-CM

## 2022-09-04 DIAGNOSIS — K625 Hemorrhage of anus and rectum: Secondary | ICD-10-CM | POA: Diagnosis not present

## 2022-09-04 DIAGNOSIS — K219 Gastro-esophageal reflux disease without esophagitis: Secondary | ICD-10-CM

## 2022-09-04 DIAGNOSIS — E1142 Type 2 diabetes mellitus with diabetic polyneuropathy: Secondary | ICD-10-CM

## 2022-09-04 DIAGNOSIS — K922 Gastrointestinal hemorrhage, unspecified: Secondary | ICD-10-CM | POA: Diagnosis not present

## 2022-09-04 DIAGNOSIS — K5641 Fecal impaction: Secondary | ICD-10-CM | POA: Diagnosis present

## 2022-09-04 DIAGNOSIS — R32 Unspecified urinary incontinence: Secondary | ICD-10-CM

## 2022-09-04 LAB — CBC WITH DIFFERENTIAL/PLATELET
Abs Immature Granulocytes: 0.06 10*3/uL (ref 0.00–0.07)
Basophils Absolute: 0 10*3/uL (ref 0.0–0.1)
Basophils Relative: 0 %
Eosinophils Absolute: 0 10*3/uL (ref 0.0–0.5)
Eosinophils Relative: 0 %
HCT: 28.9 % — ABNORMAL LOW (ref 36.0–46.0)
Hemoglobin: 9.5 g/dL — ABNORMAL LOW (ref 12.0–15.0)
Immature Granulocytes: 1 %
Lymphocytes Relative: 12 %
Lymphs Abs: 1.3 10*3/uL (ref 0.7–4.0)
MCH: 25.6 pg — ABNORMAL LOW (ref 26.0–34.0)
MCHC: 32.9 g/dL (ref 30.0–36.0)
MCV: 77.9 fL — ABNORMAL LOW (ref 80.0–100.0)
Monocytes Absolute: 0.6 10*3/uL (ref 0.1–1.0)
Monocytes Relative: 6 %
Neutro Abs: 8.1 10*3/uL — ABNORMAL HIGH (ref 1.7–7.7)
Neutrophils Relative %: 81 %
Platelets: 335 10*3/uL (ref 150–400)
RBC: 3.71 MIL/uL — ABNORMAL LOW (ref 3.87–5.11)
RDW: 13.2 % (ref 11.5–15.5)
WBC: 10.1 10*3/uL (ref 4.0–10.5)
nRBC: 0 % (ref 0.0–0.2)

## 2022-09-04 LAB — RETICULOCYTES
Immature Retic Fract: 11.7 % (ref 2.3–15.9)
RBC.: 3.7 MIL/uL — ABNORMAL LOW (ref 3.87–5.11)
Retic Count, Absolute: 32.9 10*3/uL (ref 19.0–186.0)
Retic Ct Pct: 0.9 % (ref 0.4–3.1)

## 2022-09-04 LAB — COMPREHENSIVE METABOLIC PANEL
ALT: 13 U/L (ref 0–44)
AST: 16 U/L (ref 15–41)
Albumin: 1.8 g/dL — ABNORMAL LOW (ref 3.5–5.0)
Alkaline Phosphatase: 93 U/L (ref 38–126)
Anion gap: 5 (ref 5–15)
BUN: 20 mg/dL (ref 8–23)
CO2: 25 mmol/L (ref 22–32)
Calcium: 7.5 mg/dL — ABNORMAL LOW (ref 8.9–10.3)
Chloride: 104 mmol/L (ref 98–111)
Creatinine, Ser: 0.64 mg/dL (ref 0.44–1.00)
GFR, Estimated: >60 mL/min (ref >60)
Glucose, Bld: 73 mg/dL (ref 70–99)
Potassium: 3.6 mmol/L (ref 3.5–5.1)
Sodium: 134 mmol/L — ABNORMAL LOW (ref 135–145)
Total Bilirubin: 0.6 mg/dL (ref 0.3–1.2)
Total Protein: 5.1 g/dL — ABNORMAL LOW (ref 6.5–8.1)

## 2022-09-04 LAB — CBG MONITORING, ED
Glucose-Capillary: 70 mg/dL (ref 70–99)
Glucose-Capillary: 100 mg/dL — ABNORMAL HIGH (ref 70–99)
Glucose-Capillary: 88 mg/dL (ref 70–99)

## 2022-09-04 LAB — HEMOGLOBIN AND HEMATOCRIT, BLOOD
HCT: 33.5 % — ABNORMAL LOW (ref 36.0–46.0)
Hemoglobin: 10.9 g/dL — ABNORMAL LOW (ref 12.0–15.0)

## 2022-09-04 LAB — PROTIME-INR
INR: 1.2 (ref 0.8–1.2)
Prothrombin Time: 15.1 seconds (ref 11.4–15.2)

## 2022-09-04 LAB — GLUCOSE, CAPILLARY
Glucose-Capillary: 155 mg/dL — ABNORMAL HIGH (ref 70–99)
Glucose-Capillary: 95 mg/dL (ref 70–99)

## 2022-09-04 LAB — MAGNESIUM: Magnesium: 2 mg/dL (ref 1.7–2.4)

## 2022-09-04 LAB — APTT: aPTT: 36 seconds (ref 24–36)

## 2022-09-04 LAB — HEMOGLOBIN A1C
Hgb A1c MFr Bld: 5.3 % (ref 4.8–5.6)
Mean Plasma Glucose: 105.41 mg/dL

## 2022-09-04 MED ORDER — FLEET ENEMA 7-19 GM/118ML RE ENEM
1.0000 | ENEMA | Freq: Once | RECTAL | Status: AC
  Administered 2022-09-04: 1 via RECTAL
  Filled 2022-09-04: qty 1

## 2022-09-04 MED ORDER — GABAPENTIN 300 MG PO CAPS
300.0000 mg | ORAL_CAPSULE | Freq: Three times a day (TID) | ORAL | Status: DC
  Administered 2022-09-04: 300 mg via ORAL
  Filled 2022-09-04: qty 1

## 2022-09-04 MED ORDER — INSULIN ASPART 100 UNIT/ML IJ SOLN
0.0000 [IU] | Freq: Three times a day (TID) | INTRAMUSCULAR | Status: DC
  Administered 2022-09-04: 1 [IU] via SUBCUTANEOUS
  Administered 2022-09-05: 3 [IU] via SUBCUTANEOUS
  Filled 2022-09-04: qty 0.06

## 2022-09-04 MED ORDER — FLEET ENEMA 7-19 GM/118ML RE ENEM: 1.0000 | ENEMA | Freq: Once | RECTAL | Status: DC

## 2022-09-04 MED ORDER — MELATONIN 3 MG PO TABS
3.0000 mg | ORAL_TABLET | Freq: Every evening | ORAL | Status: DC | PRN
  Administered 2022-09-04 – 2022-09-05 (×2): 3 mg via ORAL
  Filled 2022-09-04 (×2): qty 1

## 2022-09-04 MED ORDER — POLYETHYLENE GLYCOL 3350 17 G PO PACK
17.0000 g | PACK | Freq: Two times a day (BID) | ORAL | Status: DC
  Administered 2022-09-04 (×2): 17 g via ORAL
  Filled 2022-09-04 (×2): qty 1

## 2022-09-04 MED ORDER — ACETAMINOPHEN 325 MG PO TABS
650.0000 mg | ORAL_TABLET | Freq: Four times a day (QID) | ORAL | Status: DC | PRN
  Administered 2022-09-05 – 2022-09-06 (×4): 650 mg via ORAL
  Filled 2022-09-04 (×4): qty 2

## 2022-09-04 MED ORDER — ONDANSETRON HCL 4 MG/2ML IJ SOLN
4.0000 mg | Freq: Four times a day (QID) | INTRAMUSCULAR | Status: DC | PRN
  Administered 2022-09-05 – 2022-09-06 (×2): 4 mg via INTRAVENOUS
  Filled 2022-09-04 (×3): qty 2

## 2022-09-04 MED ORDER — ACETAMINOPHEN 650 MG RE SUPP: 650.0000 mg | Freq: Four times a day (QID) | RECTAL | Status: DC | PRN

## 2022-09-04 MED ORDER — DOCUSATE SODIUM 100 MG PO CAPS
100.0000 mg | ORAL_CAPSULE | Freq: Two times a day (BID) | ORAL | Status: DC
  Administered 2022-09-04 – 2022-09-05 (×3): 100 mg via ORAL
  Filled 2022-09-04 (×3): qty 1

## 2022-09-04 MED ORDER — GERHARDT'S BUTT CREAM
TOPICAL_CREAM | Freq: Two times a day (BID) | CUTANEOUS | Status: DC
  Administered 2022-09-04: 1 via TOPICAL
  Filled 2022-09-04: qty 1

## 2022-09-04 MED ORDER — DULOXETINE HCL 20 MG PO CPEP
20.0000 mg | ORAL_CAPSULE | Freq: Every day | ORAL | Status: DC
  Administered 2022-09-04 – 2022-09-06 (×3): 20 mg via ORAL
  Filled 2022-09-04 (×3): qty 1

## 2022-09-04 MED ORDER — PANTOPRAZOLE SODIUM 40 MG PO TBEC
40.0000 mg | DELAYED_RELEASE_TABLET | Freq: Every day | ORAL | Status: DC
  Administered 2022-09-04 – 2022-09-06 (×3): 40 mg via ORAL
  Filled 2022-09-04 (×3): qty 1

## 2022-09-04 MED ORDER — SORBITOL 70 % SOLN
960.0000 mL | TOPICAL_OIL | Freq: Once | ORAL | Status: AC
  Administered 2022-09-04: 960 mL via RECTAL
  Filled 2022-09-04: qty 240

## 2022-09-04 MED ORDER — HYDRALAZINE HCL 20 MG/ML IJ SOLN
5.0000 mg | Freq: Four times a day (QID) | INTRAMUSCULAR | Status: AC | PRN
  Administered 2022-09-04: 5 mg via INTRAVENOUS
  Filled 2022-09-04: qty 1

## 2022-09-04 MED ORDER — SODIUM CHLORIDE 0.9 % IV SOLN: INTRAVENOUS | Status: AC

## 2022-09-04 MED ORDER — POLYETHYLENE GLYCOL 3350 17 G PO PACK: 17.0000 g | PACK | Freq: Every day | ORAL | Status: DC

## 2022-09-04 NOTE — ED Notes (Addendum)
Pts CBG is 70, pt is on a clear liquid diet. EDP notified. Pt given apple juice per EDP.

## 2022-09-04 NOTE — Progress Notes (Signed)
While chaperoning the MD to obtain a stool sample, I noticed the patient was in 2 briefs and skin appeared red.
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
## 2022-09-04 NOTE — ED Notes (Signed)
Assumed care of this pt. Pt is sleeping in bed with equal chest rise and fall. Awakens to verbal stimuli. Pt denies pain at this time.
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
## 2022-09-04 NOTE — Progress Notes (Signed)
[facility] PROGRESS NOTE    Progress Note  Emily [name]  ZOX:[id number] DOB: 01-Aug-1940 DOA: 09/03/2022 PCP: Wilmer Floor, NP     Brief Narrative:   Emily [name] is an 82 y.o. female past medical history significant for constipation, diabetes mellitus type 2 with peripheral neuropathy started noticing several days prior to admission bright red blood in the diaper most of the history is obtained by the patient's daughter  Assessment/Plan:   Acute lower GI bleeding/acute blood loss anemia/microcytic anemia Probably hemoglobin of 12.8, on admission 10.6.  He is currently on no anticoagulation. GI was consulted. Hemoglobin has remained stable she denies any abdominal pain. There is probably an acute on chronic her MCV is low. PT and INR unremarkable.  Leukocytosis Likely reactive due to GI bleed.  Has remained afebrile.  Fecal implantation: CT abdomen pelvis showed massive stool ball in rectum measuring 8 cm. Started on gentle hydration strict I's and O's MiraLAX and Colace scheduled. Has not had a bowel movement sodium phosphate enema if that does not work we will try a smog    DM2 (diabetes mellitus, type 2) (HCC) Currently on hold oral hypoglycemic agents at home blood glucose 90-73. A1c is pending  GERD (gastroesophageal reflux disease) PPI.   Stage I sacral decubitus ulcer present on admission RN Pressure Injury Documentation: Pressure Ulcer 08/12/12 Stage I -  Intact skin with non-blanchable redness of a localized area usually over a bony prominence. (Active)  08/12/12 2300  Location: Sacrum  Location Orientation: Medial  Staging: Stage I -  Intact skin with non-blanchable redness of a localized area usually over a bony prominence.  Wound Description (Comments):   Present on Admission: Yes       DVT prophylaxis: scd Family Communication:daughter Status is: Observation The patient remains OBS appropriate and will d/c before 2 midnights.    Code  Status:     Code Status Orders  (From admission, onward)           Start     Ordered   09/04/22 0101  Full code  Continuous       Question:  By:  Answer:  Consent: discussion documented in EHR   09/04/22 0100           Code Status History     Date Active Date Inactive Code Status Order ID Comments User Context   05/23/2018 1746 05/24/2018 1859 Full Code [id number]  Sandre Kitty, MD ED   06/19/2017 1816 06/23/2017 1354 Full Code [id number]  Russella Dar, NP ED   08/21/2016 1619 08/23/2016 2151 Full Code [id number]  Adam Phenix, PA-C ED   10/20/2012 0021 10/24/2012 1720 Full Code [id number]  Eduard Clos, MD Inpatient   08/12/2012 2316 08/19/2012 1722 Full Code [id number]  Alison Murray, MD Inpatient   06/11/2012 0525 06/17/2012 1833 Full Code [id number]  Houston Siren, MD ED   10/10/2010 1531 10/11/2010 0231 Full Code [id number]  Arby Barrette HOV         IV Access:   Peripheral IV   Procedures and diagnostic studies:   CT ABDOMEN PELVIS W CONTRAST  Result Date: 09/03/2022 CLINICAL DATA:  Acute abdominal pain EXAM: CT ABDOMEN AND PELVIS WITH CONTRAST TECHNIQUE: Multidetector CT imaging of the abdomen and pelvis was performed using the standard protocol following bolus administration of intravenous contrast. RADIATION DOSE REDUCTION: This exam was performed according to the departmental dose-optimization program which includes automated exposure control, adjustment of the mA and/or kV according to patient  size and/or use of iterative reconstruction technique. CONTRAST:  80mL OMNIPAQUE IOHEXOL 300 MG/ML  SOLN COMPARISON:  07/20/2022 FINDINGS: Lower Chest: Unremarkable Hepatobiliary: Intrahepatic and extrahepatic biliary dilatation with the common bile duct measuring 16 mm. Status post cholecystectomy. No focal hepatic lesion. Pancreas: Pancreatic duct is dilated, measuring 3 mm in the tail, worsened since 07/20/2022. Spleen: Normal. Adrenals/Urinary Tract: The adrenal glands  are normal. No hydronephrosis, nephroureterolithiasis or solid renal mass. The urinary bladder is normal for degree of distention Stomach/Bowel: There is no hiatal hernia. Normal duodenal course and caliber. No small bowel dilatation or inflammation. There is a massive stool ball in the rectum, measuring 8.8 cm in transverse dimension. The appendix is not visualized. No right lower quadrant inflammation or free fluid. Vascular/Lymphatic: There is calcific atherosclerosis of the abdominal aorta. No lymphadenopathy. Reproductive: There are fluid attenuation foci in the anterior left lower quadrant that may be adnexal cyst or bladder diverticula. Largest area measures 3.5 cm. Other: Cachexia and body wall edema. Musculoskeletal: No bony spinal canal stenosis or focal osseous abnormality. IMPRESSION: 1. Massive stool ball in the rectum, measuring 8.8 cm in transverse dimension. 2. Intrahepatic and extrahepatic biliary dilatation with the common bile duct measuring 16 mm. Pancreatic duct is dilated, measuring 3 mm in the tail, worsened since 07/20/2022. These findings are likely due to post cholecystectomy physiology. 3. Cachexia and body wall edema. 4. Fluid attenuation foci in the anterior left lower quadrant favored to be bladder diverticula. Aortic Atherosclerosis (ICD10-I70.0). Electronically Signed   By: Deatra Robinson M.D.   On: 09/03/2022 23:26     Medical Consultants:   None.   Subjective:    Emily [name] no complaints  Objective:    Vitals:   09/03/22 2030 09/04/22 0015 09/04/22 0345 09/04/22 0436  BP: (!) 151/114 (!) 171/70 (!) 165/72   Pulse: 75 63 (!) 56   Resp: 18 18 18    Temp:    97.6 F (36.4 C)  TempSrc:    Oral  SpO2: 97% 100% 99%   Weight:      Height:       SpO2: 99 %  No intake or output data in the 24 hours ending 09/04/22 0653 Filed Weights   09/03/22 2003  Weight: 34.5 kg    Exam: General exam: In no acute distress. Respiratory system: Good air movement and  clear to auscultation. Cardiovascular system: S1 & S2 heard, RRR. No JVD. Gastrointestinal system: Abdomen is nondistended, soft and nontender.  Extremities: No pedal edema. Skin: No rashes, lesions or ulcers Psychiatry: Judgement and insight appear normal. Mood & affect appropriate.    Data Reviewed:    Labs: Basic Metabolic Panel: Recent Labs  Lab 09/03/22 2151 09/04/22 0435  NA 136 134*  K 4.2 3.6  CL 103 104  CO2 26 25  GLUCOSE 99 73  BUN 25* 20  CREATININE 0.70 0.64  CALCIUM 8.3* 7.5*  MG  --  2.0   GFR Estimated Creatinine Clearance: 29.5 mL/min (by C-G formula based on SCr of 0.64 mg/dL). Liver Function Tests: Recent Labs  Lab 09/03/22 2151 09/04/22 0435  AST 20 16  ALT 17 13  ALKPHOS 111 93  BILITOT 0.7 0.6  PROT 6.7 5.1*  ALBUMIN 2.5* 1.8*   Recent Labs  Lab 09/03/22 2151  LIPASE 25   No results for input(s): "AMMONIA" in the last 168 hours. Coagulation profile Recent Labs  Lab 09/04/22 0445  INR 1.2   COVID-19 Labs  No results for input(s): "DDIMER", "FERRITIN", "  LDH", "CRP" in the last 72 hours.  Lab Results  Component Value Date   SARSCOV2NAA NEGATIVE 07/20/2022   SARSCOV2NAA NEGATIVE 07/15/2022   SARSCOV2NAA NOT DETECTED 02/24/2019   SARSCOV2NAA Not Detected 11/16/2018    CBC: Recent Labs  Lab 09/03/22 2151 09/04/22 0435  WBC 12.6* 10.1  NEUTROABS  --  8.1*  HGB 10.5* 9.5*  HCT 32.4* 28.9*  MCV 78.6* 77.9*  PLT 385 335   Cardiac Enzymes: No results for input(s): "CKTOTAL", "CKMB", "CKMBINDEX", "TROPONINI" in the last 168 hours. BNP (last 3 results) No results for input(s): "PROBNP" in the last 8760 hours. CBG: No results for input(s): "GLUCAP" in the last 168 hours. D-Dimer: No results for input(s): "DDIMER" in the last 72 hours. Hgb A1c: No results for input(s): "HGBA1C" in the last 72 hours. Lipid Profile: No results for input(s): "CHOL", "HDL", "LDLCALC", "TRIG", "CHOLHDL", "LDLDIRECT" in the last 72  hours. Thyroid function studies: No results for input(s): "TSH", "T4TOTAL", "T3FREE", "THYROIDAB" in the last 72 hours.  Invalid input(s): "FREET3" Anemia work up: Recent Labs    09/04/22 0435  RETICCTPCT 0.9   Sepsis Labs: Recent Labs  Lab 09/03/22 2151 09/04/22 0435  WBC 12.6* 10.1   Microbiology No results found for this or any previous visit (from the past 240 hour(s)).   Medications:    docusate sodium  100 mg Oral BID   DULoxetine  20 mg Oral Daily   gabapentin  300 mg Oral TID   insulin aspart  0-6 Units Subcutaneous TID WC   pantoprazole  40 mg Oral Daily   polyethylene glycol  17 g Oral Daily   sodium phosphate  1 enema Rectal Once   Continuous Infusions:  sodium chloride 50 mL/hr at 09/04/22 0502      LOS: 0 days   Marinda Elk  [facility]  09/04/2022, 6:53 AM

## 2022-09-05 DIAGNOSIS — K625 Hemorrhage of anus and rectum: Secondary | ICD-10-CM | POA: Diagnosis not present

## 2022-09-05 LAB — HEMOGLOBIN AND HEMATOCRIT, BLOOD
HCT: 30.2 % — ABNORMAL LOW (ref 36.0–46.0)
Hemoglobin: 9.9 g/dL — ABNORMAL LOW (ref 12.0–15.0)

## 2022-09-05 LAB — GLUCOSE, CAPILLARY
Glucose-Capillary: 71 mg/dL (ref 70–99)
Glucose-Capillary: 98 mg/dL (ref 70–99)
Glucose-Capillary: 265 mg/dL — ABNORMAL HIGH (ref 70–99)
Glucose-Capillary: 51 mg/dL — ABNORMAL LOW (ref 70–99)
Glucose-Capillary: 187 mg/dL — ABNORMAL HIGH (ref 70–99)

## 2022-09-05 MED ORDER — SENNA 8.6 MG PO TABS
2.0000 | ORAL_TABLET | Freq: Two times a day (BID) | ORAL | Status: DC
  Administered 2022-09-05: 17.2 mg via ORAL
  Filled 2022-09-05: qty 2

## 2022-09-05 MED ORDER — SODIUM CHLORIDE 0.9 % IV SOLN: 6.2500 mg | Freq: Four times a day (QID) | INTRAVENOUS | Status: DC | PRN

## 2022-09-05 MED ORDER — POLYETHYLENE GLYCOL 3350 17 G PO PACK
17.0000 g | PACK | Freq: Three times a day (TID) | ORAL | Status: DC
  Administered 2022-09-05: 17 g via ORAL
  Filled 2022-09-05: qty 1

## 2022-09-05 MED ORDER — PROMETHAZINE HCL 25 MG RE SUPP: 12.5000 mg | Freq: Four times a day (QID) | RECTAL | Status: DC | PRN

## 2022-09-05 MED ORDER — PROMETHAZINE HCL 25 MG PO TABS
12.5000 mg | ORAL_TABLET | Freq: Four times a day (QID) | ORAL | Status: DC | PRN
  Administered 2022-09-05: 12.5 mg via ORAL
  Filled 2022-09-05: qty 1

## 2022-09-05 MED ORDER — SORBITOL 70 % SOLN
960.0000 mL | TOPICAL_OIL | Freq: Once | ORAL | Status: AC
  Administered 2022-09-05: 960 mL via RECTAL
  Filled 2022-09-05: qty 240

## 2022-09-05 NOTE — Progress Notes (Signed)
[facility] PROGRESS NOTE    Progress Note  Emily [name]  ZOX:[id number] DOB: 1940/08/24 DOA: 09/03/2022 PCP: Wilmer Floor, NP     Brief Narrative:   Emily [name] is an 82 y.o. female past medical history significant for constipation, diabetes mellitus type 2 with peripheral neuropathy started noticing several days prior to admission bright red blood in the diaper most of the history is obtained by the patient's daughter  Assessment/Plan:   Acute lower GI bleeding/acute blood loss anemia/microcytic anemia Previous hemoglobin of 12.8, on admission 10.6.  She is is currently on no anticoagulation. GI was consulted recommended conservative management her hemoglobin has remained stable,  INR 1.3. There is probably an acute on chronic her MCV is low. This probably hemorrhoidal bleed due to fecal implantation. Hemoglobin this morning is pending, she no bloody bowel movements.  Leukocytosis Likely reactive due to GI bleed.  Has remained afebrile.  Fecal implantation: CT abdomen pelvis showed massive stool ball in rectum measuring 8 cm. Started on gentle hydration, she was tolerating her full liquid diet wanted to advance to regular diet. She relates her abdominal pain is improved. She has had multiple bowel movements after 2 enemas including a smog. Abdominal x-ray was done this morning that showed moderate amount of stools.  Will continue MiraLAX 3 times daily.     DM2 (diabetes mellitus, type 2) (HCC) Blood glucose controlled has only required 1 unit of insulin.  Might need to go home off oral hypoglycemic agents. A1c is 5.3.  GERD (gastroesophageal reflux disease) PPI.   Stage I sacral decubitus ulcer present on admission RN Pressure Injury Documentation: Pressure Ulcer 08/12/12 Stage I -  Intact skin with non-blanchable redness of a localized area usually over a bony prominence. (Active)  08/12/12 2300  Location: Sacrum  Location Orientation: Medial  Staging:  Stage I -  Intact skin with non-blanchable redness of a localized area usually over a bony prominence.  Wound Description (Comments):   Present on Admission: Yes       DVT prophylaxis: scd Family Communication:daughter Status is: Observation The patient remains OBS appropriate and will d/c before 2 midnights.    Code Status:     Code Status Orders  (From admission, onward)           Start     Ordered   09/04/22 0101  Full code  Continuous       Question:  By:  Answer:  Consent: discussion documented in EHR   09/04/22 0100           Code Status History     Date Active Date Inactive Code Status Order ID Comments User Context   05/23/2018 1746 05/24/2018 1859 Full Code [id number]  Sandre Kitty, MD ED   06/19/2017 1816 06/23/2017 1354 Full Code [id number]  Russella Dar, NP ED   08/21/2016 1619 08/23/2016 2151 Full Code [id number]  Adam Phenix, PA-C ED   10/20/2012 0021 10/24/2012 1720 Full Code [id number]  Eduard Clos, MD Inpatient   08/12/2012 2316 08/19/2012 1722 Full Code [id number]  Alison Murray, MD Inpatient   06/11/2012 0525 06/17/2012 1833 Full Code [id number]  Houston Siren, MD ED   10/10/2010 1531 10/11/2010 0231 Full Code [id number]  Axel Filler, Theresia Lo HOV         IV Access:   Peripheral IV   Procedures and diagnostic studies:   DG Abd 1 View  Result Date: 09/04/2022 CLINICAL DATA:  Abdominal pain, lower GI bleed EXAM: ABDOMEN -  1 VIEW COMPARISON:  Previous studies including the CT done on Sep 04, 2022 FINDINGS: Bowel gas pattern is nonspecific. Small to moderate amount of stool is seen in left colon. Rectum is obscured by dense contrast in the urinary bladder limiting evaluation. Surgical clips are seen in right upper quadrant. There is contrast in the urinary bladder from previous CT. Multiple diverticula are seen in the upper margin of the bladder. There is faint nodular density in right upper abdomen, possibly nipple shadow. IMPRESSION: Nonspecific bowel  gas pattern. Lobulations seen in the margin of the urinary bladder suggests multiple diverticula. Electronically Signed   By: Ernie Avena M.D.   On: 09/04/2022 17:38   CT ABDOMEN PELVIS W CONTRAST  Result Date: Sep 04, 2022 CLINICAL DATA:  Acute abdominal pain EXAM: CT ABDOMEN AND PELVIS WITH CONTRAST TECHNIQUE: Multidetector CT imaging of the abdomen and pelvis was performed using the standard protocol following bolus administration of intravenous contrast. RADIATION DOSE REDUCTION: This exam was performed according to the departmental dose-optimization program which includes automated exposure control, adjustment of the mA and/or kV according to patient size and/or use of iterative reconstruction technique. CONTRAST:  80mL OMNIPAQUE IOHEXOL 300 MG/ML  SOLN COMPARISON:  07/20/2022 FINDINGS: Lower Chest: Unremarkable Hepatobiliary: Intrahepatic and extrahepatic biliary dilatation with the common bile duct measuring 16 mm. Status post cholecystectomy. No focal hepatic lesion. Pancreas: Pancreatic duct is dilated, measuring 3 mm in the tail, worsened since 07/20/2022. Spleen: Normal. Adrenals/Urinary Tract: The adrenal glands are normal. No hydronephrosis, nephroureterolithiasis or solid renal mass. The urinary bladder is normal for degree of distention Stomach/Bowel: There is no hiatal hernia. Normal duodenal course and caliber. No small bowel dilatation or inflammation. There is a massive stool ball in the rectum, measuring 8.8 cm in transverse dimension. The appendix is not visualized. No right lower quadrant inflammation or free fluid. Vascular/Lymphatic: There is calcific atherosclerosis of the abdominal aorta. No lymphadenopathy. Reproductive: There are fluid attenuation foci in the anterior left lower quadrant that may be adnexal cyst or bladder diverticula. Largest area measures 3.5 cm. Other: Cachexia and body wall edema. Musculoskeletal: No bony spinal canal stenosis or focal osseous abnormality.  IMPRESSION: 1. Massive stool ball in the rectum, measuring 8.8 cm in transverse dimension. 2. Intrahepatic and extrahepatic biliary dilatation with the common bile duct measuring 16 mm. Pancreatic duct is dilated, measuring 3 mm in the tail, worsened since 07/20/2022. These findings are likely due to post cholecystectomy physiology. 3. Cachexia and body wall edema. 4. Fluid attenuation foci in the anterior left lower quadrant favored to be bladder diverticula. Aortic Atherosclerosis (ICD10-I70.0). Electronically Signed   By: Deatra Robinson M.D.   On: 04-Sep-2022 23:26     Medical Consultants:   None.   Subjective:    Emily [name] really her abdominal pain is significantly better she has had multiple bowel movements, she would like to advance her diet.  Objective:    Vitals:   09/04/22 1936 09/04/22 2330 09/05/22 0339 09/05/22 0600  BP: (!) 188/76 (!) 158/73 (!) 150/66   Pulse: 74 80 78   Resp: 16  16   Temp: 98.2 F (36.8 C)  98.1 F (36.7 C)   TempSrc:      SpO2: 100%  100%   Weight:    34.2 kg  Height:       SpO2: 100 %   Intake/Output Summary (Last 24 hours) at 09/05/2022 0906 Last data filed at 09/05/2022 0600 Gross per 24 hour  Intake 360 ml  Output 600 ml  Net -240 ml   Filed Weights   09/03/22 2003 09/05/22 0600  Weight: 34.5 kg 34.2 kg    Exam: General exam: In no acute distress. Respiratory system: Good air movement and clear to auscultation. Cardiovascular system: S1 & S2 heard, RRR. No JVD. Gastrointestinal system: Abdomen is nondistended, soft and nontender.  Extremities: No pedal edema. Skin: No rashes, lesions or ulcers Psychiatry: Judgement and insight appear normal. Mood & affect appropriate.   Data Reviewed:    Labs: Basic Metabolic Panel: Recent Labs  Lab 09/03/22 2151 09/04/22 0435  NA 136 134*  K 4.2 3.6  CL 103 104  CO2 26 25  GLUCOSE 99 73  BUN 25* 20  CREATININE 0.70 0.64  CALCIUM 8.3* 7.5*  MG  --  2.0    GFR Estimated  Creatinine Clearance: 29.3 mL/min (by C-G formula based on SCr of 0.64 mg/dL). Liver Function Tests: Recent Labs  Lab 09/03/22 2151 09/04/22 0435  AST 20 16  ALT 17 13  ALKPHOS 111 93  BILITOT 0.7 0.6  PROT 6.7 5.1*  ALBUMIN 2.5* 1.8*    Recent Labs  Lab 09/03/22 2151  LIPASE 25    No results for input(s): "AMMONIA" in the last 168 hours. Coagulation profile Recent Labs  Lab 09/04/22 0445  INR 1.2    COVID-19 Labs  No results for input(s): "DDIMER", "FERRITIN", "LDH", "CRP" in the last 72 hours.  Lab Results  Component Value Date   SARSCOV2NAA NEGATIVE 07/20/2022   SARSCOV2NAA NEGATIVE 07/15/2022   SARSCOV2NAA NOT DETECTED 02/24/2019   SARSCOV2NAA Not Detected 11/16/2018    CBC: Recent Labs  Lab 09/03/22 2151 09/04/22 0435 09/04/22 1224  WBC 12.6* 10.1  --   NEUTROABS  --  8.1*  --   HGB 10.5* 9.5* 10.9*  HCT 32.4* 28.9* 33.5*  MCV 78.6* 77.9*  --   PLT 385 335  --     Cardiac Enzymes: No results for input(s): "CKTOTAL", "CKMB", "CKMBINDEX", "TROPONINI" in the last 168 hours. BNP (last 3 results) No results for input(s): "PROBNP" in the last 8760 hours. CBG: Recent Labs  Lab 09/04/22 0850 09/04/22 1129 09/04/22 1630 09/04/22 2056 09/05/22 0812  GLUCAP 100* 88 155* 95 71   D-Dimer: No results for input(s): "DDIMER" in the last 72 hours. Hgb A1c: Recent Labs    09/04/22 0435  HGBA1C 5.3   Lipid Profile: No results for input(s): "CHOL", "HDL", "LDLCALC", "TRIG", "CHOLHDL", "LDLDIRECT" in the last 72 hours. Thyroid function studies: No results for input(s): "TSH", "T4TOTAL", "T3FREE", "THYROIDAB" in the last 72 hours.  Invalid input(s): "FREET3" Anemia work up: Recent Labs    09/04/22 0435  RETICCTPCT 0.9    Sepsis Labs: Recent Labs  Lab 09/03/22 2151 09/04/22 0435  WBC 12.6* 10.1    Microbiology No results found for this or any previous visit (from the past 240 hour(s)).   Medications:    docusate sodium  100 mg Oral  BID   DULoxetine  20 mg Oral Daily   Gerhardt's butt cream   Topical BID   insulin aspart  0-6 Units Subcutaneous TID WC   pantoprazole  40 mg Oral Daily   polyethylene glycol  17 g Oral BID   Continuous Infusions:      LOS: 0 days   Marinda Elk  [facility]  09/05/2022, 9:06 AM

## 2022-09-05 NOTE — Progress Notes (Signed)
Subjective: As per her nurse and documentation patient had a medium size clay colored bowel movement today morning. Patient denies abdominal pain but complains of weight loss which is unintentional.  Objective: Vital signs in last 24 hours: Temp:  [97.7 F (36.5 C)-98.2 F (36.8 C)] 98.1 F (36.7 C) (05/04 0339) Pulse Rate:  [74-97] 78 (05/04 0339) Resp:  [16] 16 (05/04 0339) BP: (145-188)/(66-88) 150/66 (05/04 0339) SpO2:  [99 %-100 %] 100 % (05/04 0339) Weight:  [34.2 kg] 34.2 kg (05/04 0600) Weight change: -0.272 kg Last BM Date : 09/04/22  PE: Elderly, thin GENERAL: Mild pallor, not in distress  ABDOMEN: Soft, nontender, nondistended, normoactive bowel sounds EXTREMITIES: No deformity  Lab Results: Results for orders placed or performed during the hospital encounter of 09/03/22 (from the past 48 hour(s))  Lipase, blood     Status: None   Collection Time: 09/03/22  9:51 PM  Result Value Ref Range   Lipase 25 11 - 51 U/L    Comment: Performed at [facility], [address] [place], [place] [address]  Comprehensive metabolic panel     Status: Abnormal   Collection Time: 09/03/22  9:51 PM  Result Value Ref Range   Sodium 136 135 - 145 mmol/L   Potassium 4.2 3.5 - 5.1 mmol/L   Chloride 103 98 - 111 mmol/L   CO2 26 22 - 32 mmol/L   Glucose, Bld 99 70 - 99 mg/dL    Comment: Glucose reference range applies only to samples taken after fasting for at least 8 hours.   BUN 25 (H) 8 - 23 mg/dL   Creatinine, Ser 6.04 0.44 - 1.00 mg/dL   Calcium 8.3 (L) 8.9 - 10.3 mg/dL   Total Protein 6.7 6.5 - 8.1 g/dL   Albumin 2.5 (L) 3.5 - 5.0 g/dL   AST 20 15 - 41 U/L   ALT 17 0 - 44 U/L   Alkaline Phosphatase 111 38 - 126 U/L   Total Bilirubin 0.7 0.3 - 1.2 mg/dL   GFR, Estimated >54 >09 mL/min    Comment: (NOTE) Calculated using the CKD-EPI Creatinine Equation (2021)    Anion gap 7 5 - 15    Comment: Performed at [facility], [address] [place], [place] [address]  CBC     Status: Abnormal   Collection Time: 09/03/22  9:51 PM  Result Value Ref Range   WBC 12.6 (H) 4.0 - 10.5 K/uL   RBC 4.12 3.87 - 5.11 MIL/uL   Hemoglobin 10.5 (L) 12.0 - 15.0 g/dL   HCT 47.8 (L) 29.5 - 62.1 %   MCV 78.6 (L) 80.0 - 100.0 fL   MCH 25.5 (L) 26.0 - 34.0 pg   MCHC 32.4 30.0 - 36.0 g/dL   RDW 30.8 65.7 - 84.6 %   Platelets 385 150 - 400 K/uL   nRBC 0.0 0.0 - 0.2 %    Comment: Performed at [facility], [address] [place], [place] [address]  Type and screen [facility]     Status: None   Collection Time: 09/03/22  9:51 PM  Result Value Ref Range   ABO/RH(D) O POS    Antibody Screen NEG    Sample Expiration      09/06/2022,2359 Performed at [facility], [address] [place], [place] [address]   POC occult blood, ED     Status: Abnormal   Collection Time: 09/03/22  9:59 PM  Result  Value Ref Range   Fecal Occult Bld POSITIVE (A) NEGATIVE  CBC with Differential/Platelet     Status: Abnormal   Collection Time: 09/04/22  4:35 AM  Result Value Ref Range   WBC 10.1 4.0 - 10.5 K/uL   RBC 3.71 (L) 3.87 - 5.11 MIL/uL   Hemoglobin 9.5 (L) 12.0 - 15.0 g/dL   HCT 69.6 (L) 29.5 - 28.4 %   MCV 77.9 (L) 80.0 - 100.0 fL   MCH 25.6 (L) 26.0 - 34.0 pg   MCHC 32.9 30.0 - 36.0 g/dL   RDW 13.2 44.0 - 10.2 %   Platelets 335 150 - 400 K/uL   nRBC 0.0 0.0 - 0.2 %   Neutrophils Relative % 81 %   Neutro Abs 8.1 (H) 1.7 - 7.7 K/uL   Lymphocytes Relative 12 %   Lymphs Abs 1.3 0.7 - 4.0 K/uL   Monocytes Relative 6 %   Monocytes Absolute 0.6 0.1 - 1.0 K/uL   Eosinophils Relative 0 %   Eosinophils Absolute 0.0 0.0 - 0.5 K/uL   Basophils Relative 0 %   Basophils Absolute 0.0 0.0 - 0.1 K/uL   Immature Granulocytes 1 %   Abs Immature Granulocytes 0.06 0.00 - 0.07 K/uL    Comment: Performed at [facility], [address], [place], [place] [address]  Comprehensive  metabolic panel     Status: Abnormal   Collection Time: 09/04/22  4:35 AM  Result Value Ref Range   Sodium 134 (L) 135 - 145 mmol/L   Potassium 3.6 3.5 - 5.1 mmol/L   Chloride 104 98 - 111 mmol/L   CO2 25 22 - 32 mmol/L   Glucose, Bld 73 70 - 99 mg/dL    Comment: Glucose reference range applies only to samples taken after fasting for at least 8 hours.   BUN 20 8 - 23 mg/dL   Creatinine, Ser 6.44 0.44 - 1.00 mg/dL   Calcium 7.5 (L) 8.9 - 10.3 mg/dL   Total Protein 5.1 (L) 6.5 - 8.1 g/dL   Albumin 1.8 (L) 3.5 - 5.0 g/dL   AST 16 15 - 41 U/L   ALT 13 0 - 44 U/L   Alkaline Phosphatase 93 38 - 126 U/L   Total Bilirubin 0.6 0.3 - 1.2 mg/dL   GFR, Estimated >03 >47 mL/min    Comment: (NOTE) Calculated using the CKD-EPI Creatinine Equation (2021)    Anion gap 5 5 - 15    Comment: Performed at [facility], [address] [place], [place] [address]  Magnesium     Status: None   Collection Time: 09/04/22  4:35 AM  Result Value Ref Range   Magnesium 2.0 1.7 - 2.4 mg/dL    Comment: Performed at [facility], [address] [place], [place] [address]  Reticulocytes     Status: Abnormal   Collection Time: 09/04/22  4:35 AM  Result Value Ref Range   Retic Ct Pct 0.9 0.4 - 3.1 %   RBC. 3.70 (L) 3.87 - 5.11 MIL/uL   Retic Count, Absolute 32.9 19.0 - 186.0 K/uL   Immature Retic Fract 11.7 2.3 - 15.9 %    Comment: Performed at [facility], [address], [place], [place] [address]  Hemoglobin A1c     Status: None   Collection Time: 09/04/22  4:35 AM  Result Value Ref Range   Hgb A1c MFr Bld 5.3 4.8 - 5.6 %    Comment: (NOTE) Pre  diabetes:          5.7%-6.4%  Diabetes:              >6.4%  Glycemic control for   <7.0% adults with diabetes    Mean Plasma Glucose 105.41 mg/dL    Comment: Performed at [facility], [address], [place], [place] [address]  Protime-INR     Status: None   Collection Time: 09/04/22  4:45 AM   Result Value Ref Range   Prothrombin Time 15.1 11.4 - 15.2 seconds   INR 1.2 0.8 - 1.2    Comment: (NOTE) INR goal varies based on device and disease states. Performed at [facility], [address], [place], [place] [address]   APTT     Status: None   Collection Time: 09/04/22  4:45 AM  Result Value Ref Range   aPTT 36 24 - 36 seconds    Comment: Performed at [facility], [address] [place], [place] [address]  CBG monitoring, ED     Status: None   Collection Time: 09/04/22  7:45 AM  Result Value Ref Range   Glucose-Capillary 70 70 - 99 mg/dL    Comment: Glucose reference range applies only to samples taken after fasting for at least 8 hours.  CBG monitoring, ED     Status: Abnormal   Collection Time: 09/04/22  8:50 AM  Result Value Ref Range   Glucose-Capillary 100 (H) 70 - 99 mg/dL    Comment: Glucose reference range applies only to samples taken after fasting for at least 8 hours.  CBG monitoring, ED     Status: None   Collection Time: 09/04/22 11:29 AM  Result Value Ref Range   Glucose-Capillary 88 70 - 99 mg/dL    Comment: Glucose reference range applies only to samples taken after fasting for at least 8 hours.  Hemoglobin and hematocrit, blood     Status: Abnormal   Collection Time: 09/04/22 12:24 PM  Result Value Ref Range   Hemoglobin 10.9 (L) 12.0 - 15.0 g/dL   HCT 60.1 (L) 09.3 - 23.5 %    Comment: Performed at [facility], [address], [place], [place] [address]  Glucose, capillary     Status: Abnormal   Collection Time: 09/04/22  4:30 PM  Result Value Ref Range   Glucose-Capillary 155 (H) 70 - 99 mg/dL    Comment: Glucose reference range applies only to samples taken after fasting for at least 8 hours.  Glucose, capillary     Status: None   Collection Time: 09/04/22  8:56 PM  Result Value Ref Range   Glucose-Capillary 95 70 - 99 mg/dL    Comment: Glucose reference range applies only to samples  taken after fasting for at least 8 hours.  Glucose, capillary     Status: None   Collection Time: 09/05/22  8:12 AM  Result Value Ref Range   Glucose-Capillary 71 70 - 99 mg/dL    Comment: Glucose reference range applies only to samples taken after fasting for at least 8 hours.   Comment 1 Notify RN    *Note: Due to a large number of results and/or encounters for the requested time period, some results have not been displayed. A complete set of results can be found in Results Review.    Studies/Results: DG Abd 1 View  Result Date: 09/04/2022 CLINICAL DATA:  Abdominal pain, lower GI bleed EXAM: ABDOMEN - 1 VIEW COMPARISON:  Previous  studies including the CT done on 09/27/22 FINDINGS: Bowel gas pattern is nonspecific. Small to moderate amount of stool is seen in left colon. Rectum is obscured by dense contrast in the urinary bladder limiting evaluation. Surgical clips are seen in right upper quadrant. There is contrast in the urinary bladder from previous CT. Multiple diverticula are seen in the upper margin of the bladder. There is faint nodular density in right upper abdomen, possibly nipple shadow. IMPRESSION: Nonspecific bowel gas pattern. Lobulations seen in the margin of the urinary bladder suggests multiple diverticula. Electronically Signed   By: Ernie Avena M.D.   On: 09/04/2022 17:38   CT ABDOMEN PELVIS W CONTRAST  Result Date: 2022-09-27 CLINICAL DATA:  Acute abdominal pain EXAM: CT ABDOMEN AND PELVIS WITH CONTRAST TECHNIQUE: Multidetector CT imaging of the abdomen and pelvis was performed using the standard protocol following bolus administration of intravenous contrast. RADIATION DOSE REDUCTION: This exam was performed according to the departmental dose-optimization program which includes automated exposure control, adjustment of the mA and/or kV according to patient size and/or use of iterative reconstruction technique. CONTRAST:  80mL OMNIPAQUE IOHEXOL 300 MG/ML  SOLN  COMPARISON:  07/20/2022 FINDINGS: Lower Chest: Unremarkable Hepatobiliary: Intrahepatic and extrahepatic biliary dilatation with the common bile duct measuring 16 mm. Status post cholecystectomy. No focal hepatic lesion. Pancreas: Pancreatic duct is dilated, measuring 3 mm in the tail, worsened since 07/20/2022. Spleen: Normal. Adrenals/Urinary Tract: The adrenal glands are normal. No hydronephrosis, nephroureterolithiasis or solid renal mass. The urinary bladder is normal for degree of distention Stomach/Bowel: There is no hiatal hernia. Normal duodenal course and caliber. No small bowel dilatation or inflammation. There is a massive stool ball in the rectum, measuring 8.8 cm in transverse dimension. The appendix is not visualized. No right lower quadrant inflammation or free fluid. Vascular/Lymphatic: There is calcific atherosclerosis of the abdominal aorta. No lymphadenopathy. Reproductive: There are fluid attenuation foci in the anterior left lower quadrant that may be adnexal cyst or bladder diverticula. Largest area measures 3.5 cm. Other: Cachexia and body wall edema. Musculoskeletal: No bony spinal canal stenosis or focal osseous abnormality. IMPRESSION: 1. Massive stool ball in the rectum, measuring 8.8 cm in transverse dimension. 2. Intrahepatic and extrahepatic biliary dilatation with the common bile duct measuring 16 mm. Pancreatic duct is dilated, measuring 3 mm in the tail, worsened since 07/20/2022. These findings are likely due to post cholecystectomy physiology. 3. Cachexia and body wall edema. 4. Fluid attenuation foci in the anterior left lower quadrant favored to be bladder diverticula. Aortic Atherosclerosis (ICD10-I70.0). Electronically Signed   By: Deatra Robinson M.D.   On: 27-Sep-2022 23:26    Medications: I have reviewed the patient's current medications.  Assessment: Rectal bleeding-hemoglobin stable at 10.9, has not required blood transfusion, mild microcytosis Massive stool ball noted  in rectum measuring 8.8 cm on CAT scan History of diverticulosis and internal hemorrhoids, last colonoscopy 2014, Dr. Juanda Chance History of chronic constipation, was on Motegrity at home, stopped taking it for the last 2 weeks  Plan: Continue current management of MiraLAX 3 times a day along with stool softener Colace, 100 mg twice a day. Will add Senokot 2 tablets twice a day. Smog enema x 1 has been ordered for today morning. We will continue to follow and continue conservative management.  Kerin Salen, MD 09/05/2022, 11:26 AM

## 2022-09-06 DIAGNOSIS — D72829 Elevated white blood cell count, unspecified: Secondary | ICD-10-CM | POA: Diagnosis not present

## 2022-09-06 DIAGNOSIS — K625 Hemorrhage of anus and rectum: Secondary | ICD-10-CM | POA: Diagnosis not present

## 2022-09-06 LAB — GLUCOSE, CAPILLARY
Glucose-Capillary: 128 mg/dL — ABNORMAL HIGH (ref 70–99)
Glucose-Capillary: 144 mg/dL — ABNORMAL HIGH (ref 70–99)
Glucose-Capillary: 63 mg/dL — ABNORMAL LOW (ref 70–99)
Glucose-Capillary: 78 mg/dL (ref 70–99)
Glucose-Capillary: 87 mg/dL (ref 70–99)

## 2022-09-06 MED ORDER — FERROUS SULFATE 325 (65 FE) MG PO TBEC: 325.0000 mg | DELAYED_RELEASE_TABLET | Freq: Every day | ORAL | 3 refills | Status: AC

## 2022-09-06 MED ORDER — SENNA 8.6 MG PO TABS: 1.0000 | ORAL_TABLET | Freq: Every day | ORAL | Status: DC

## 2022-09-06 MED ORDER — SENNA 8.6 MG PO TABS: 2.0000 | ORAL_TABLET | Freq: Two times a day (BID) | ORAL | 0 refills | Status: DC

## 2022-09-06 MED ORDER — POLYETHYLENE GLYCOL 3350 17 G PO PACK: 17.0000 g | PACK | Freq: Every day | ORAL | 0 refills | Status: DC

## 2022-09-06 MED ORDER — POLYETHYLENE GLYCOL 3350 17 G PO PACK: 17.0000 g | PACK | Freq: Every day | ORAL | Status: DC

## 2022-09-06 NOTE — Progress Notes (Signed)
At 2052 Pt blood glucose was 51, hypoglycemia protocol imitated. 15 grams of carb given. Rechecked pt blood glucose and it was 187. On call provider notified, order received to check pt CBG every 4 hr. At 0030 pt blood glucose was 63. 15 grams of carb given, rechecked CBG and it was 144. On call provider notified aware. At 0407 pt  CBG was 128.

## 2022-09-06 NOTE — Discharge Summary (Signed)
Physician Discharge Summary  Emily [name] ZOX:[id number] DOB: Mar 27, 1941 DOA: 09/03/2022  PCP: Wilmer Floor, NP  Admit date: 09/03/2022 Discharge date: 09/06/2022  Admitted From: Home Disposition:  Home  Recommendations for Outpatient Follow-up:  Follow up with PCP in 1-2 weeks   Home Health:Yes Equipment/Devices:None  Discharge Condition:Stable CODE STATUS:Full Diet recommendation: Heart Healthy  Brief/Interim Summary: 82 y.o. female past medical history significant for constipation, diabetes mellitus type 2 with peripheral neuropathy started noticing several days prior to admission bright red blood in the diaper most of the history is obtained by the patient's daughter   Discharge Diagnoses:  Principal Problem:   Acute lower GI bleeding Active Problems:   Leukocytosis   DM2 (diabetes mellitus, type 2) (HCC)   GERD (gastroesophageal reflux disease)   Acute blood loss anemia   Fecal impaction (HCC)   Urinary incontinence  Acute lower GI bleed/acute blood loss anemia/microcytic anemia: Previous hemoglobin was 12 on admission 10.6. She is currently on anticoagulation. GI was consulted recommended conservative management. There is probably acute on chronic right MCV is low. Hemoglobin has remained stable.  She will go home on oral iron once a day. She had no episodes of bleeding in house.  Leukocytosis: Likely reactive due to fecal constipation she will remain afebrile.  Fecal implantation/abdominal pain: CT scan of the abdomen pelvis showed massive stool ball in the rectum measuring about 8 cm. She was started on IV hydration and started on MiraLAX 3 times daily Colace and she was given 2 smog, she had multiple bowel movement. Her abdominal pain improved. She tolerated her diet. She will go home on a bowel regimen, the patient and daughter have been instructed about continuing and being compliant with her bowel regimen. At home she is on loperamide this was  discontinued.  Diabetes mellitus type 2: With an A1c of 5.3. She will go home off oral hypoglycemic agents.  GERD: Continue PPI.  Stage I significant results are present on admission: Turn patient every 2 hours. Physical therapy evaluated the patient, she will go home with PT. Discharge Instructions  Discharge Instructions     Diet - low sodium heart healthy   Complete by: As directed    Increase activity slowly   Complete by: As directed       Allergies as of 09/06/2022   No Known Allergies      Medication List     TAKE these medications    amitriptyline 10 MG tablet Commonly known as: ELAVIL TAKE 1 TABLET(10 MG) BY MOUTH AT BEDTIME   ammonium lactate 12 % cream Commonly known as: Lac-Hydrin Apply 1 Application topically as needed for dry skin.   B-1 PO Take 1 tablet by mouth daily.   B-12 COMPLIANCE INJECTION IJ Inject 1 Dose as directed every 14 (fourteen) days.   dicyclomine 10 MG capsule Commonly known as: BENTYL TAKE 1 CAPSULE BY MOUTH EVERY 8 HOURS AS NEEDED What changed: See the new instructions.   Ensure Take 237 mLs by mouth 3 (three) times daily between meals.   esomeprazole 20 MG capsule Commonly known as: NEXIUM TAKE 1 CAPSULE(20 MG) BY MOUTH DAILY AS NEEDED FOR GERD What changed: See the new instructions.   gabapentin 300 MG capsule Commonly known as: Neurontin Take 1 capsule (300 mg total) by mouth 3 (three) times daily.   lidocaine 5 % Commonly known as: LIDODERM USE 1 PATCH EXTERNALLY ONE DAILY REMOVE AND DISCARD WITHIN 12 HOURS. What changed:  how much to take how to take this  when to take this reasons to take this additional instructions   loperamide 2 MG tablet Commonly known as: IMODIUM A-D Take 2 mg by mouth 4 (four) times daily as needed for diarrhea or loose stools.   losartan 25 MG tablet Commonly known as: COZAAR Take 25 mg by mouth daily.   meclizine 12.5 MG tablet Commonly known as: ANTIVERT TAKE 1  TABLET(12.5 MG) BY MOUTH THREE TIMES DAILY AS NEEDED FOR DIZZINESS   Melatonin 3 MG Caps Take 1 capsule (3 mg total) by mouth at bedtime.   mirtazapine 15 MG tablet Commonly known as: REMERON Take 1 tablet (15 mg total) by mouth at bedtime.   Motegrity 1 MG Tabs Generic drug: Prucalopride Succinate Take by mouth.   naproxen sodium 220 MG tablet Commonly known as: ALEVE Take 220 mg by mouth 2 (two) times daily as needed (Pain).   ondansetron 4 MG tablet Commonly known as: ZOFRAN Take 1 tablet (4 mg total) by mouth every 6 (six) hours. What changed:  when to take this reasons to take this   oxyCODONE-acetaminophen 7.5-325 MG tablet Commonly known as: Percocet Take 1 tablet by mouth every 8 (eight) hours as needed for moderate pain. What changed:  how much to take when to take this   phenylephrine 0.25 % suppository Commonly known as: (USE for PREPARATION-H) Place 1 suppository rectally 2 (two) times daily as needed for hemorrhoids.   polyethylene glycol 17 g packet Commonly known as: MIRALAX / GLYCOLAX Take 17 g by mouth daily.   prochlorperazine 10 MG tablet Commonly known as: COMPAZINE Take 10 mg by mouth 3 (three) times daily as needed for nausea or vomiting.   promethazine 12.5 MG tablet Commonly known as: PHENERGAN TAKE 1 TABLET(12.5 MG) BY MOUTH EVERY 4 HOURS AS NEEDED FOR NAUSEA OR VOMITING        No Known Allergies  Consultations: Gi  Procedures/Studies: DG Abd 1 View  Result Date: 09/04/2022 CLINICAL DATA:  Abdominal pain, lower GI bleed EXAM: ABDOMEN - 1 VIEW COMPARISON:  Previous studies including the CT done on 09-06-22 FINDINGS: Bowel gas pattern is nonspecific. Small to moderate amount of stool is seen in left colon. Rectum is obscured by dense contrast in the urinary bladder limiting evaluation. Surgical clips are seen in right upper quadrant. There is contrast in the urinary bladder from previous CT. 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 There is faint nodular density in right upper abdomen, possibly nipple shadow. IMPRESSION: Nonspecific bowel gas pattern. Lobulations seen in the margin of the urinary bladder suggests multiple diverticula. Electronically Signed   By: Ernie Avena M.D.   On: 09/04/2022 17:38   CT ABDOMEN PELVIS W CONTRAST  Result Date: 09-06-2022 CLINICAL DATA:  Acute abdominal pain EXAM: CT ABDOMEN AND PELVIS WITH CONTRAST TECHNIQUE: Multidetector CT imaging of the abdomen and pelvis was performed using the standard protocol following bolus administration of intravenous contrast. RADIATION DOSE REDUCTION: This exam was performed according to the departmental dose-optimization program which includes automated exposure control, adjustment of the mA and/or kV according to patient size and/or use of iterative reconstruction technique. CONTRAST:  80mL OMNIPAQUE IOHEXOL 300 MG/ML  SOLN COMPARISON:  07/20/2022 FINDINGS: Lower Chest: Unremarkable Hepatobiliary: Intrahepatic and extrahepatic biliary dilatation with the common bile duct measuring 16 mm. Status post cholecystectomy. No focal hepatic lesion. Pancreas: Pancreatic duct is dilated, measuring 3 mm in the tail, worsened since 07/20/2022. Spleen: Normal. Adrenals/Urinary Tract: The adrenal glands are normal. No hydronephrosis, nephroureterolithiasis  or solid renal mass. The urinary bladder is normal for degree of distention Stomach/Bowel: There is no hiatal hernia. Normal duodenal course and caliber. No small bowel dilatation or inflammation. There is a massive stool ball in the rectum, measuring 8.8 cm in transverse dimension. The appendix is not visualized. No right lower quadrant inflammation or free fluid. Vascular/Lymphatic: There is calcific atherosclerosis of the abdominal aorta. No lymphadenopathy. Reproductive: There are fluid attenuation foci in the anterior left lower quadrant that may be adnexal cyst or bladder diverticula. Largest area  measures 3.5 cm. Other: Cachexia and body wall edema. Musculoskeletal: No bony spinal canal stenosis or focal osseous abnormality. IMPRESSION: 1. Massive stool ball in the rectum, measuring 8.8 cm in transverse dimension. 2. Intrahepatic and extrahepatic biliary dilatation with the common bile duct measuring 16 mm. Pancreatic duct is dilated, measuring 3 mm in the tail, worsened since 07/20/2022. These findings are likely due to post cholecystectomy physiology. 3. Cachexia and body wall edema. 4. Fluid attenuation foci in the anterior left lower quadrant favored to be bladder diverticula. Aortic Atherosclerosis (ICD10-I70.0). Electronically Signed   By: Deatra Robinson M.D.   On: 09/03/2022 23:26   (Echo, Carotid, EGD, Colonoscopy, ERCP)    Subjective: No complaints  Discharge Exam: Vitals:   09/05/22 1958 09/06/22 0506  BP: (!) 152/93 (!) 166/81  Pulse: 80 74  Resp: 16 16  Temp: 98.2 F (36.8 C) 98.2 F (36.8 C)  SpO2: 100% 100%   Vitals:   09/05/22 0600 09/05/22 1155 09/05/22 1958 09/06/22 0506  BP:  (!) 153/86 (!) 152/93 (!) 166/81  Pulse:  73 80 74  Resp:  18 16 16   Temp:  98.9 F (37.2 C) 98.2 F (36.8 C) 98.2 F (36.8 C)  TempSrc:  Oral Oral Oral  SpO2:  100% 100% 100%  Weight: 34.2 kg   31.9 kg  Height:        General: Pt is alert, awake, not in acute distress Cardiovascular: RRR, S1/S2 +, no rubs, no gallops Respiratory: CTA bilaterally, no wheezing, no rhonchi Abdominal: Soft, NT, ND, bowel sounds + Extremities: no edema, no cyanosis    The results of significant diagnostics from this hospitalization (including imaging, microbiology, ancillary and laboratory) are listed below for reference.     Microbiology: No results found for this or any previous visit (from the past 240 hour(s)).   Labs: BNP (last 3 results) No results for input(s): "BNP" in the last 8760 hours. Basic Metabolic Panel: Recent Labs  Lab 09/03/22 2151 09/04/22 0435  NA 136 134*  K 4.2  3.6  CL 103 104  CO2 26 25  GLUCOSE 99 73  BUN 25* 20  CREATININE 0.70 0.64  CALCIUM 8.3* 7.5*  MG  --  2.0   Liver Function Tests: Recent Labs  Lab 09/03/22 2151 09/04/22 0435  AST 20 16  ALT 17 13  ALKPHOS 111 93  BILITOT 0.7 0.6  PROT 6.7 5.1*  ALBUMIN 2.5* 1.8*   Recent Labs  Lab 09/03/22 2151  LIPASE 25   No results for input(s): "AMMONIA" in the last 168 hours. CBC: Recent Labs  Lab 09/03/22 2151 09/04/22 0435 09/04/22 1224 09/05/22 1146  WBC 12.6* 10.1  --   --   NEUTROABS  --  8.1*  --   --   HGB 10.5* 9.5* 10.9* 9.9*  HCT 32.4* 28.9* 33.5* 30.2*  MCV 78.6* 77.9*  --   --   PLT 385 335  --   --  Cardiac Enzymes: No results for input(s): "CKTOTAL", "CKMB", "CKMBINDEX", "TROPONINI" in the last 168 hours. BNP: Invalid input(s): "POCBNP" CBG: Recent Labs  Lab 09/05/22 2133 09/06/22 0030 09/06/22 0114 09/06/22 0407 09/06/22 0828  GLUCAP 187* 63* 144* 128* 78   D-Dimer No results for input(s): "DDIMER" in the last 72 hours. Hgb A1c Recent Labs    09/04/22 0435  HGBA1C 5.3   Lipid Profile No results for input(s): "CHOL", "HDL", "LDLCALC", "TRIG", "CHOLHDL", "LDLDIRECT" in the last 72 hours. Thyroid function studies No results for input(s): "TSH", "T4TOTAL", "T3FREE", "THYROIDAB" in the last 72 hours.  Invalid input(s): "FREET3" Anemia work up Recent Labs    09/04/22 0435  RETICCTPCT 0.9   Urinalysis    Component Value Date/Time   COLORURINE STRAW (A) 07/20/2022 1317   APPEARANCEUR CLEAR 07/20/2022 1317   LABSPEC 1.025 07/20/2022 1317   PHURINE 7.0 07/20/2022 1317   GLUCOSEU NEGATIVE 07/20/2022 1317   GLUCOSEU NEGATIVE 06/22/2014 1426   HGBUR LARGE (A) 07/20/2022 1317   BILIRUBINUR NEGATIVE 07/20/2022 1317   KETONESUR NEGATIVE 07/20/2022 1317   PROTEINUR NEGATIVE 07/20/2022 1317   UROBILINOGEN 1.0 02/23/2015 1917   NITRITE NEGATIVE 07/20/2022 1317   LEUKOCYTESUR TRACE (A) 07/20/2022 1317   Sepsis Labs Recent Labs  Lab  09/03/22 2151 09/04/22 0435  WBC 12.6* 10.1   Microbiology No results found for this or any previous visit (from the past 240 hour(s)).    SIGNED:   Marinda Elk, MD  [facility] 09/06/2022, 9:02 AM Pager   If 7PM-7AM, please contact night-coverage [email or web address] Password [email or web address]

## 2022-09-06 NOTE — Evaluation (Signed)
Physical Therapy One Time Evaluation Patient Details Name: Emily [name] MRN: [id number] DOB: 01-Apr-1941 Today's Date: 09/06/2022  History of Present Illness  82 y.o. female past medical history significant for constipation, diabetes mellitus type 2 with peripheral neuropathy and admitted 09/03/22 for acute lower GI bleeding  Clinical Impression  Patient evaluated by Physical Therapy with no further acute PT needs identified. All education has been completed and the patient has no further questions.  Pt able to ambulate short distance in hallway without physical assist.  Pt feels able to safely return home and reports she has a caregiver daily as well as family daily to assist her with any needs and with negotiating stairs (bedroom on second level).  Pt would benefit from f/u PT upon d/c if agreeable.  PT is signing off. Thank you for this referral.        Recommendations for follow up therapy are one component of a multi-disciplinary discharge planning process, led by the attending physician.  Recommendations may be updated based on patient status, additional functional criteria and insurance authorization.  Follow Up Recommendations       Assistance Recommended at Discharge PRN  Patient can return home with the following  A little help with bathing/dressing/bathroom;Assistance with cooking/housework;Assist for transportation;Help with stairs or ramp for entrance    Equipment Recommendations None recommended by PT  Recommendations for Other Services       Functional Status Assessment Patient has had a recent decline in their functional status and demonstrates the ability to make significant improvements in function in a reasonable and predictable amount of time.     Precautions / Restrictions Precautions Precautions: Fall Restrictions Weight Bearing Restrictions: No      Mobility  Bed Mobility Overal bed mobility: Modified Independent                  Transfers Overall  transfer level: Needs assistance Equipment used: Rolling walker (2 wheels) Transfers: Sit to/from Stand Sit to Stand: Supervision                Ambulation/Gait Ambulation/Gait assistance: Supervision, Min guard Gait Distance (Feet): 80 Feet Assistive device: Rolling walker (2 wheels) Gait Pattern/deviations: Step-through pattern, Decreased stride length Gait velocity: decr     General Gait Details: provided min/guard for safety however pt did not require any assist, appears steady with RW, no overt LOB, pt also denies any recent falls, distance per pt as she is mostly household Forensic psychologist [place] (Stroke Patients Only)       Balance Overall balance assessment: Mild deficits observed, not formally tested (denies any recent falls, uses rollator for mobility)                                           Pertinent Vitals/Pain Pain Assessment Pain Assessment: No/denies pain    Home Living Family/patient expects to be discharged to:: Private residence Living Arrangements: Alone Available Help at Discharge: Family;Personal care attendant;Available 24 hours/day Type of Home: House Home Access: Level entry     Alternate Level Stairs-Number of Steps: flight to her bedroom Home Layout: Bed/bath upstairs Home Equipment: Agricultural consultant (2 wheels);Rollator (4 wheels) Additional Comments: multiple family members assist pt daily and also has daily caregiver for a few hours each day, pt always has assist  with stairs    Prior Function Prior Level of Function : Independent/Modified Independent             Mobility Comments: uses rollator ADLs Comments: caregiver assists with bathing and dressing     Hand Dominance        Extremity/Trunk Assessment   Upper Extremity Assessment Upper Extremity Assessment: Generalized weakness    Lower Extremity Assessment Lower Extremity Assessment: Generalized  weakness    Cervical / Trunk Assessment Cervical / Trunk Assessment: Kyphotic  Communication   Communication: No difficulties  Cognition Arousal/Alertness: Awake/alert Behavior During Therapy: WFL for tasks assessed/performed Overall Cognitive Status: Within Functional Limits for tasks assessed                                          General Comments      Exercises     Assessment/Plan    PT Assessment All further PT needs can be met in the next venue of care  PT Problem List Decreased mobility;Decreased activity tolerance;Decreased balance;Decreased strength       PT Treatment Interventions      PT Goals (Current goals can be found in the Care Plan section)  Acute Rehab PT Goals PT Goal Formulation: All assessment and education complete, DC therapy    Frequency       Co-evaluation               AM-PAC PT "6 Clicks" Mobility  Outcome Measure Help needed turning from your back to your side while in a flat bed without using bedrails?: None Help needed moving from lying on your back to sitting on the side of a flat bed without using bedrails?: None Help needed moving to and from a bed to a chair (including a wheelchair)?: A Little Help needed standing up from a chair using your arms (e.g., wheelchair or bedside chair)?: A Little Help needed to walk in hospital room?: A Little Help needed climbing 3-5 steps with a railing? : A Little 6 Click Score: 20    End of Session Equipment Utilized During Treatment: Gait belt Activity Tolerance: Patient tolerated treatment well Patient left: in chair;with call bell/phone within reach;with chair alarm set Nurse Communication: Mobility status PT Visit Diagnosis: Difficulty in walking, not elsewhere classified (R26.2);Muscle weakness (generalized) (M62.81)    Time: 1610-9604 PT Time Calculation (min) (ACUTE ONLY): 14 min   Charges:   PT Evaluation $PT Eval Low Complexity: 1 Low         Kati PT,  DPT Physical Therapist Acute Rehabilitation Services Office: [phone]   [name] 09/06/2022, 12:08 PM

## 2022-09-06 NOTE — TOC Transition Note (Addendum)
Transition of Care [facility]) - CM/SW Discharge Note   Patient Details  Name: Emily [name] MRN: [id number] Date of Birth: 1941/04/24  [facility] CM/SW Contact:  Howell Rucks, RN Phone Number: 09/06/2022, 12:23 PM   Clinical Narrative:  DC Home, order for [facility] PT/OT, NCM call to pt in room,introduced TOC/TCM role and review for dc needs, pt agreeable to [facility] PT/OT, no  preference. NCM will assist with [facility] PT/OT. Pt reports he dtr will provide transport. No further TOC needs identified.  - 1:23pm [facility] for [facility] PT/OT.  NCM spoke with pt's dtr [name]), confirmed agreeable with [facility] PT/OT, informed Iantha Fallen accepted pt for [facility], dtr voiced understanding. No further TOC needs identified    Final next level of care: Home w Home Health Services Barriers to Discharge: No Barriers Identified   Patient Goals and CMS Choice CMS Medicare.gov Compare Post Acute Care list provided to:: Patient Choice offered to / list presented to : Patient  Discharge Placement                         Discharge Plan and Services Additional resources added to the After Visit Summary for     Discharge Planning Services: CM Consult                      HH Arranged: OT, PT          Social Determinants of Health (SDOH) Interventions SDOH Screenings   Depression (PHQ2-9): Low Risk  (05/07/2022)  Tobacco Use: High Risk (09/04/2022)     Readmission Risk Interventions     No data to display

## 2022-09-06 NOTE — Progress Notes (Signed)
Subjective: Patient has had multiple brown bowel movement with a combination of laxatives, no further black stools or bloody bowel movement noted.  Objective: Vital signs in last 24 hours: Temp:  [98.2 F (36.8 C)-98.9 F (37.2 C)] 98.2 F (36.8 C) (05/05 0506) Pulse Rate:  [73-80] 74 (05/05 0506) Resp:  [16-18] 16 (05/05 0506) BP: (152-166)/(81-93) 166/81 (05/05 0506) SpO2:  [100 %] 100 % (05/05 0506) Weight:  [31.9 kg] 31.9 kg (05/05 0506) Weight change: -2.301 kg Last BM Date : 09/06/22  PE: Elderly GENERAL: Mild pallor  ABDOMEN: Nondistended EXTREMITIES: No deformity  Lab Results: Results for orders placed or performed during the hospital encounter of 09/03/22 (from the past 48 hour(s))  Hemoglobin and hematocrit, blood     Status: Abnormal   Collection Time: 09/04/22 12:24 PM  Result Value Ref Range   Hemoglobin 10.9 (L) 12.0 - 15.0 g/dL   HCT 29.5 (L) 62.1 - 30.8 %    Comment: Performed at [facility], [address], [place], [place] [address]  Glucose, capillary     Status: Abnormal   Collection Time: 09/04/22  4:30 PM  Result Value Ref Range   Glucose-Capillary 155 (H) 70 - 99 mg/dL    Comment: Glucose reference range applies only to samples taken after fasting for at least 8 hours.  Glucose, capillary     Status: None   Collection Time: 09/04/22  8:56 PM  Result Value Ref Range   Glucose-Capillary 95 70 - 99 mg/dL    Comment: Glucose reference range applies only to samples taken after fasting for at least 8 hours.  Glucose, capillary     Status: None   Collection Time: 09/05/22  8:12 AM  Result Value Ref Range   Glucose-Capillary 71 70 - 99 mg/dL    Comment: Glucose reference range applies only to samples taken after fasting for at least 8 hours.   Comment 1 Notify RN   Hemoglobin and hematocrit, blood     Status: Abnormal   Collection Time: 09/05/22 11:46 AM  Result Value Ref Range   Hemoglobin 9.9 (L) 12.0 - 15.0 g/dL   HCT 69.6 (L)  29.5 - 46.0 %    Comment: Performed at [facility], [address] [place], [place] [address]  Glucose, capillary     Status: None   Collection Time: 09/05/22 12:26 PM  Result Value Ref Range   Glucose-Capillary 98 70 - 99 mg/dL    Comment: Glucose reference range applies only to samples taken after fasting for at least 8 hours.   Comment 1 Notify RN   Glucose, capillary     Status: Abnormal   Collection Time: 09/05/22  4:49 PM  Result Value Ref Range   Glucose-Capillary 265 (H) 70 - 99 mg/dL    Comment: Glucose reference range applies only to samples taken after fasting for at least 8 hours.   Comment 1 Notify RN   Glucose, capillary     Status: Abnormal   Collection Time: 09/05/22  8:52 PM  Result Value Ref Range   Glucose-Capillary 51 (L) 70 - 99 mg/dL    Comment: Glucose reference range applies only to samples taken after fasting for at least 8 hours.  Glucose, capillary     Status: Abnormal   Collection Time: 09/05/22  9:33 PM  Result Value Ref Range   Glucose-Capillary 187 (H) 70 - 99 mg/dL    Comment: Glucose reference range applies only to samples taken after fasting for  at least 8 hours.  Glucose, capillary     Status: Abnormal   Collection Time: 09/06/22 12:30 AM  Result Value Ref Range   Glucose-Capillary 63 (L) 70 - 99 mg/dL    Comment: Glucose reference range applies only to samples taken after fasting for at least 8 hours.  Glucose, capillary     Status: Abnormal   Collection Time: 09/06/22  1:14 AM  Result Value Ref Range   Glucose-Capillary 144 (H) 70 - 99 mg/dL    Comment: Glucose reference range applies only to samples taken after fasting for at least 8 hours.  Glucose, capillary     Status: Abnormal   Collection Time: 09/06/22  4:07 AM  Result Value Ref Range   Glucose-Capillary 128 (H) 70 - 99 mg/dL    Comment: Glucose reference range applies only to samples taken after fasting for at least 8 hours.  Glucose, capillary     Status: None    Collection Time: 09/06/22  8:28 AM  Result Value Ref Range   Glucose-Capillary 78 70 - 99 mg/dL    Comment: Glucose reference range applies only to samples taken after fasting for at least 8 hours.   Comment 1 Notify RN    *Note: Due to a large number of results and/or encounters for the requested time period, some results have not been displayed. A complete set of results can be found in Results Review.    Studies/Results: DG Abd 1 View  Result Date: 09/04/2022 CLINICAL DATA:  Abdominal pain, lower GI bleed EXAM: ABDOMEN - 1 VIEW COMPARISON:  Previous studies including the CT done on 09-24-2022 FINDINGS: Bowel gas pattern is nonspecific. Small to moderate amount of stool is seen in left colon. Rectum is obscured by dense contrast in the urinary bladder limiting evaluation. Surgical clips are seen in right upper quadrant. There is contrast in the urinary bladder from previous CT. Multiple diverticula are seen in the upper margin of the bladder. There is faint nodular density in right upper abdomen, possibly nipple shadow. IMPRESSION: Nonspecific bowel gas pattern. Lobulations seen in the margin of the urinary bladder suggests multiple diverticula. Electronically Signed   By: Ernie Avena M.D.   On: 09/04/2022 17:38    Medications: I have reviewed the patient's current medications.  Assessment: Rectal bleeding with fecal impaction, possible stercoral colitis/ulceration, history of diverticulosis and internal hemorrhoids  Plan: Being discharged today. Advised to take stool softeners at home. Advised to take laxatives on a regular basis to avoid constipation and fecal impaction, discussed about over-the-counter remedies such as MiraLAX, Colace, senna, Metamucil, Benefiber.  Kerin Salen, MD 09/06/2022, 11:32 AM

## 2022-09-10 ENCOUNTER — Encounter: Payer: 59 | Admitting: Registered Nurse

## 2022-09-11 ENCOUNTER — Other Ambulatory Visit: Payer: Self-pay

## 2022-09-11 ENCOUNTER — Inpatient Hospital Stay: Payer: 59 | Attending: Hematology | Admitting: Dietician

## 2022-09-11 NOTE — Progress Notes (Signed)
Nutrition Follow-up:   Patient with anemia and history of neuroendocrine tumor of appendix. She is receiving monthly B12 injections + IV Venofer as needed   5/2-5/5 hospital admission - fecal impaction  Noted pt is being followed by [facility] for weight loss  Met with patient and care giver in office. Patient reports she feels appetite has improved some in the last few days. Care giver recalls noticing small improvements over the last 3 weeks. Patient has had a few cravings recently which she does not typically have. Care giver reports patient wanted croaker fish last night. She had a few bites along with cole slaw and fries. Patient is drinking 3 Boost Plus. Care giver states she sips on these all day. She would like to eat more solid foods. Pt reports meeting with another RD weeks ago. It was recommended she eat every 2 hours. Pt has been unable to do this as it hurts her stomach. Pt has chronic constipation. She is not taking miralax daily as instructed. Care giver is asking about feeding tube.    Medications: remeron, miralax, senna, motegrity  Labs: 5/3 - Na 134, albumin 1.8  Anthropometrics: Pt 72 lb in office today   NUTRITION DIAGNOSIS: Unintended weight loss continues    MALNUTRITION DIAGNOSIS: Severe malnutrition continues   INTERVENTION:  Educated on soft diet and suggested modifying foods (via blender/adding broth) to smooth textures for ease of intake/digestion  Continue drinking 3 Boost Plus/equivalent - coupons + samples of Boost VHC for pt to try (530 kcal, 23 g) Encouraged bowel regimen as prescribed by MD Discussed strategies for gastroparesis - encouraged pt to sit up while eating, small walks after intake, avoid laying down for 30 minutes after eating Given PMH including gastrectomy with Roux-en-Y in 2021 recommend pt take daily bariatric MVI. Discussed micronutrient deficiencies related to procedure - recommend checking labs including vit A, vit D, vit E, copper, zinc,  selenium Given above, pt would not be candidate for PEG  Pt followed by [facility] gastroenterology     MONITORING, EVALUATION, GOAL: weight trends, intake   NEXT VISIT: No follow-up scheduled

## 2022-09-29 ENCOUNTER — Inpatient Hospital Stay: Payer: 59

## 2022-09-30 ENCOUNTER — Ambulatory Visit (INDEPENDENT_AMBULATORY_CARE_PROVIDER_SITE_OTHER): Payer: 59 | Admitting: Podiatry

## 2022-09-30 ENCOUNTER — Encounter: Payer: Self-pay | Admitting: Podiatry

## 2022-09-30 ENCOUNTER — Ambulatory Visit: Payer: Medicaid Other | Admitting: Hematology

## 2022-09-30 DIAGNOSIS — B351 Tinea unguium: Secondary | ICD-10-CM | POA: Diagnosis not present

## 2022-09-30 DIAGNOSIS — E0842 Diabetes mellitus due to underlying condition with diabetic polyneuropathy: Secondary | ICD-10-CM

## 2022-09-30 DIAGNOSIS — M79674 Pain in right toe(s): Secondary | ICD-10-CM

## 2022-09-30 DIAGNOSIS — M79675 Pain in left toe(s): Secondary | ICD-10-CM

## 2022-09-30 MED ORDER — AMMONIUM LACTATE 12 % EX CREA: 1.0000 | TOPICAL_CREAM | CUTANEOUS | 0 refills | Status: AC | PRN

## 2022-09-30 NOTE — Progress Notes (Signed)
This patient returns to my office for at risk foot care.  This patient requires this care by a professional since this patient will be at risk due to having  Diabetes.  This patient is unable to cut nails herself since the patient cannot reach her nails.These nails are painful walking and wearing shoes.  This patient presents for at risk foot care today.  This patient requests that I do not treat her nail on big toe right and second toe right.  General Appearance  Alert, conversant and in no acute stress.  Vascular  Dorsalis pedis and posterior tibial  pulses are  weakly palpable  bilaterally.  Capillary return is within normal limits  bilaterally. Temperature is within normal limits  bilaterally.  Neurologic  Senn-Weinstein monofilament wire test absent   bilaterally. Muscle power within normal limits bilaterally.  Nails Thick disfigured discolored nails with subungual debris  from hallux to fifth toes bilaterally. No evidence of bacterial infection or drainage bilaterally.  Orthopedic  No limitations of motion  feet .  No crepitus or effusions noted.  DJD IPJ right foot.  Hammer toe second toe right foot.  Skin  normotropic skin with no porokeratosis noted bilaterally.  No signs of infections or ulcers noted.  Peeling noted on feet  B/L.  Onychomycosis  Pain in right toes  Pain in left toes  Dermatitis  Consent was obtained for treatment procedures.   Mechanical debridement of nails 1-5  bilaterally performed with a nail nipper.  Filed with dremel without incident.  Prescribe Lac-Hydrin for this patient.   Return office visit   3 months                   Told patient to return for periodic foot care and evaluation due to potential at risk complications.   Helane Gunther DPM
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
## 2022-10-05 ENCOUNTER — Emergency Department (HOSPITAL_COMMUNITY): Payer: 59

## 2022-10-05 ENCOUNTER — Observation Stay (HOSPITAL_COMMUNITY)
Admission: EM | Admit: 2022-10-05 | Discharge: 2022-10-07 | Disposition: A | Discharge status: Home Health | Payer: 59 | Attending: Family Medicine | Admitting: Family Medicine

## 2022-10-05 ENCOUNTER — Encounter (HOSPITAL_COMMUNITY): Payer: Self-pay

## 2022-10-05 ENCOUNTER — Other Ambulatory Visit: Payer: Self-pay

## 2022-10-05 DIAGNOSIS — I10 Essential (primary) hypertension: Secondary | ICD-10-CM | POA: Insufficient documentation

## 2022-10-05 DIAGNOSIS — N3000 Acute cystitis without hematuria: Secondary | ICD-10-CM

## 2022-10-05 DIAGNOSIS — Z85038 Personal history of other malignant neoplasm of large intestine: Secondary | ICD-10-CM | POA: Diagnosis not present

## 2022-10-05 DIAGNOSIS — F17210 Nicotine dependence, cigarettes, uncomplicated: Secondary | ICD-10-CM | POA: Diagnosis not present

## 2022-10-05 DIAGNOSIS — M6281 Muscle weakness (generalized): Secondary | ICD-10-CM | POA: Diagnosis not present

## 2022-10-05 DIAGNOSIS — R2689 Other abnormalities of gait and mobility: Secondary | ICD-10-CM | POA: Insufficient documentation

## 2022-10-05 DIAGNOSIS — E43 Unspecified severe protein-calorie malnutrition: Secondary | ICD-10-CM | POA: Insufficient documentation

## 2022-10-05 DIAGNOSIS — Z79899 Other long term (current) drug therapy: Secondary | ICD-10-CM | POA: Insufficient documentation

## 2022-10-05 DIAGNOSIS — E119 Type 2 diabetes mellitus without complications: Secondary | ICD-10-CM | POA: Insufficient documentation

## 2022-10-05 DIAGNOSIS — N390 Urinary tract infection, site not specified: Principal | ICD-10-CM | POA: Diagnosis present

## 2022-10-05 DIAGNOSIS — R5383 Other fatigue: Secondary | ICD-10-CM

## 2022-10-05 DIAGNOSIS — Z86718 Personal history of other venous thrombosis and embolism: Secondary | ICD-10-CM | POA: Diagnosis not present

## 2022-10-05 DIAGNOSIS — G9341 Metabolic encephalopathy: Secondary | ICD-10-CM | POA: Diagnosis not present

## 2022-10-05 LAB — BASIC METABOLIC PANEL
Anion gap: 8 (ref 5–15)
BUN: 16 mg/dL (ref 8–23)
CO2: 24 mmol/L (ref 22–32)
Calcium: 7.9 mg/dL — ABNORMAL LOW (ref 8.9–10.3)
Chloride: 107 mmol/L (ref 98–111)
Creatinine, Ser: 0.94 mg/dL (ref 0.44–1.00)
GFR, Estimated: >60 mL/min (ref >60)
Glucose, Bld: 88 mg/dL (ref 70–99)
Potassium: 3.5 mmol/L (ref 3.5–5.1)
Sodium: 139 mmol/L (ref 135–145)

## 2022-10-05 LAB — CBC WITH DIFFERENTIAL/PLATELET
Abs Immature Granulocytes: 0.02 10*3/uL (ref 0.00–0.07)
Basophils Absolute: 0 10*3/uL (ref 0.0–0.1)
Basophils Relative: 0 %
Eosinophils Absolute: 0 10*3/uL (ref 0.0–0.5)
Eosinophils Relative: 1 %
HCT: 32.1 % — ABNORMAL LOW (ref 36.0–46.0)
Hemoglobin: 10.1 g/dL — ABNORMAL LOW (ref 12.0–15.0)
Immature Granulocytes: 0 %
Lymphocytes Relative: 36 %
Lymphs Abs: 1.9 10*3/uL (ref 0.7–4.0)
MCH: 25.4 pg — ABNORMAL LOW (ref 26.0–34.0)
MCHC: 31.5 g/dL (ref 30.0–36.0)
MCV: 80.7 fL (ref 80.0–100.0)
Monocytes Absolute: 0.4 10*3/uL (ref 0.1–1.0)
Monocytes Relative: 7 %
Neutro Abs: 3 10*3/uL (ref 1.7–7.7)
Neutrophils Relative %: 56 %
Platelets: 212 10*3/uL (ref 150–400)
RBC: 3.98 MIL/uL (ref 3.87–5.11)
RDW: 14 % (ref 11.5–15.5)
WBC: 5.3 10*3/uL (ref 4.0–10.5)
nRBC: 0 % (ref 0.0–0.2)

## 2022-10-05 LAB — URINALYSIS, ROUTINE W REFLEX MICROSCOPIC
Bilirubin Urine: NEGATIVE
Glucose, UA: NEGATIVE mg/dL
Ketones, ur: NEGATIVE mg/dL
Nitrite: NEGATIVE
Protein, ur: 30 mg/dL — AB
Specific Gravity, Urine: 1.016 (ref 1.005–1.030)
WBC, UA: >50 WBC/hpf (ref 0–5)
pH: 5 (ref 5.0–8.0)

## 2022-10-05 LAB — HEPATIC FUNCTION PANEL
ALT: 16 U/L (ref 0–44)
AST: 19 U/L (ref 15–41)
Albumin: <1.5 g/dL — ABNORMAL LOW (ref 3.5–5.0)
Alkaline Phosphatase: 57 U/L (ref 38–126)
Bilirubin, Direct: <0.1 mg/dL (ref 0.0–0.2)
Total Bilirubin: 0.5 mg/dL (ref 0.3–1.2)
Total Protein: 3.9 g/dL — ABNORMAL LOW (ref 6.5–8.1)

## 2022-10-05 LAB — TROPONIN I (HIGH SENSITIVITY)
Troponin I (High Sensitivity): 13 ng/L (ref <18)
Troponin I (High Sensitivity): 11 ng/L (ref <18)

## 2022-10-05 LAB — CBG MONITORING, ED: Glucose-Capillary: 89 mg/dL (ref 70–99)

## 2022-10-05 LAB — URINE CULTURE

## 2022-10-05 LAB — LIPASE, BLOOD: Lipase: 23 U/L (ref 11–51)

## 2022-10-05 MED ORDER — OXYCODONE-ACETAMINOPHEN 5-325 MG PO TABS
1.0000 | ORAL_TABLET | Freq: Two times a day (BID) | ORAL | Status: DC | PRN
  Administered 2022-10-06 – 2022-10-07 (×2): 1 via ORAL
  Filled 2022-10-05 (×2): qty 1

## 2022-10-05 MED ORDER — ONDANSETRON HCL 4 MG/2ML IJ SOLN
4.0000 mg | Freq: Four times a day (QID) | INTRAMUSCULAR | Status: DC | PRN
  Administered 2022-10-05 – 2022-10-07 (×4): 4 mg via INTRAVENOUS
  Filled 2022-10-05 (×4): qty 2

## 2022-10-05 MED ORDER — POLYETHYLENE GLYCOL 3350 17 G PO PACK: 17.0000 g | PACK | Freq: Every day | ORAL | Status: DC | PRN

## 2022-10-05 MED ORDER — PANTOPRAZOLE SODIUM 40 MG PO TBEC
40.0000 mg | DELAYED_RELEASE_TABLET | Freq: Every day | ORAL | Status: DC
  Administered 2022-10-05 – 2022-10-07 (×3): 40 mg via ORAL
  Filled 2022-10-05 (×3): qty 1

## 2022-10-05 MED ORDER — SENNOSIDES-DOCUSATE SODIUM 8.6-50 MG PO TABS
1.0000 | ORAL_TABLET | Freq: Every day | ORAL | Status: DC
  Administered 2022-10-06: 1 via ORAL
  Filled 2022-10-05: qty 1

## 2022-10-05 MED ORDER — SODIUM CHLORIDE 0.9 % IV BOLUS
1000.0000 mL | Freq: Once | INTRAVENOUS | Status: AC
  Administered 2022-10-05: 1000 mL via INTRAVENOUS

## 2022-10-05 MED ORDER — SODIUM CHLORIDE 0.9 % IV SOLN: INTRAVENOUS | Status: DC

## 2022-10-05 MED ORDER — OXYCODONE HCL 5 MG PO TABS
5.0000 mg | ORAL_TABLET | ORAL | Status: DC | PRN
  Administered 2022-10-06 (×2): 5 mg via ORAL
  Filled 2022-10-05 (×2): qty 1

## 2022-10-05 MED ORDER — HYDRALAZINE HCL 20 MG/ML IJ SOLN
10.0000 mg | Freq: Four times a day (QID) | INTRAMUSCULAR | Status: DC | PRN
  Administered 2022-10-05: 10 mg via INTRAVENOUS
  Filled 2022-10-05: qty 1

## 2022-10-05 MED ORDER — SODIUM CHLORIDE 0.9 % IV SOLN
1.0000 g | INTRAVENOUS | Status: DC
  Administered 2022-10-05 – 2022-10-07 (×3): 1 g via INTRAVENOUS
  Filled 2022-10-05 (×3): qty 10

## 2022-10-05 MED ORDER — ARTIFICIAL TEARS OPHTHALMIC OINT
1.0000 | TOPICAL_OINTMENT | Freq: Once | OPHTHALMIC | Status: AC
  Administered 2022-10-05: 1 via OPHTHALMIC
  Filled 2022-10-05: qty 3.5

## 2022-10-05 MED ORDER — ENOXAPARIN SODIUM 30 MG/0.3ML IJ SOSY
30.0000 mg | PREFILLED_SYRINGE | INTRAMUSCULAR | Status: DC
  Administered 2022-10-05 – 2022-10-06 (×2): 30 mg via SUBCUTANEOUS
  Filled 2022-10-05 (×2): qty 0.3

## 2022-10-05 MED ORDER — ACETAMINOPHEN 650 MG RE SUPP: 650.0000 mg | Freq: Four times a day (QID) | RECTAL | Status: DC | PRN

## 2022-10-05 MED ORDER — ACETAMINOPHEN 325 MG PO TABS: 650.0000 mg | ORAL_TABLET | Freq: Four times a day (QID) | ORAL | Status: DC | PRN

## 2022-10-05 MED ORDER — ENOXAPARIN SODIUM 40 MG/0.4ML IJ SOSY: 40.0000 mg | PREFILLED_SYRINGE | INTRAMUSCULAR | Status: DC

## 2022-10-05 MED ORDER — ALBUTEROL SULFATE (2.5 MG/3ML) 0.083% IN NEBU
2.5000 mg | INHALATION_SOLUTION | Freq: Four times a day (QID) | RESPIRATORY_TRACT | Status: DC
  Administered 2022-10-05: 2.5 mg via RESPIRATORY_TRACT
  Filled 2022-10-05: qty 3
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
## 2022-10-05 NOTE — ED Provider Notes (Signed)
EMERGENCY DEPARTMENT AT [facility] Provider Note   CSN: [id number] Arrival date & time: 10/05/22  1331     History  Chief Complaint  Patient presents with   Fatigue    Emily [name] is a 82 y.o. female with past medical history of hypertension, diabetes, DVT, who presents emergency department concerns for fatigue.  Family notes that when they were giving patient her back this morning she just seemed more fatigued than her baseline.  Patient declines chest pain, shortness of breath, abdominal pain, nausea, vomiting.  Patient's last bowel movement was this morning.  The history is provided by the patient, a relative and a caregiver. No language interpreter was used.       Home Medications Prior to Admission medications   Medication Sig Start Date End Date Taking? Authorizing Provider  amitriptyline (ELAVIL) 10 MG tablet TAKE 1 TABLET(10 MG) BY MOUTH AT BEDTIME Patient not taking: Reported on 09/04/2022 06/12/22   Raulkar, Drema Pry, MD  ammonium lactate (LAC-HYDRIN) 12 % cream Apply 1 Application topically as needed for dry skin. 09/30/22   Helane Gunther, DPM  Cyanocobalamin (B-12 COMPLIANCE INJECTION IJ) Inject 1 Dose as directed every 14 (fourteen) days.    [provider]  dicyclomine (BENTYL) 10 MG capsule TAKE 1 CAPSULE BY MOUTH EVERY 8 HOURS AS NEEDED Patient taking differently: Take 10 mg by mouth every 8 (eight) hours as needed for spasms. 05/06/22   Napoleon Form, MD  Ensure (ENSURE) Take 237 mLs by mouth 3 (three) times daily between meals. 02/26/22   [provider]  esomeprazole (NEXIUM) 20 MG capsule TAKE 1 CAPSULE(20 MG) BY MOUTH DAILY AS NEEDED FOR GERD Patient taking differently: Take 20 mg by mouth daily as needed (GERD). 04/22/22   Kirsteins, Victorino Sparrow, MD  ferrous sulfate 325 (65 FE) MG EC tablet Take 1 tablet (325 mg total) by mouth daily with breakfast. 09/06/22 01/04/23  Marinda Elk, MD  gabapentin (NEURONTIN) 300 MG  capsule Take 1 capsule (300 mg total) by mouth 3 (three) times daily. Patient not taking: Reported on 09/04/2022 08/11/22   Raulkar, Drema Pry, MD  lidocaine (LIDODERM) 5 % USE 1 PATCH EXTERNALLY ONE DAILY REMOVE AND DISCARD WITHIN 12 HOURS. Patient taking differently: Place 1 patch onto the skin daily as needed (Pain). 05/21/21   Napoleon Form, MD  losartan (COZAAR) 25 MG tablet Take 25 mg by mouth daily. 05/22/20   [provider]  meclizine (ANTIVERT) 12.5 MG tablet TAKE 1 TABLET(12.5 MG) BY MOUTH THREE TIMES DAILY AS NEEDED FOR DIZZINESS Patient not taking: Reported on 09/04/2022 06/11/22   Raulkar, Drema Pry, MD  Melatonin 3 MG CAPS Take 1 capsule (3 mg total) by mouth at bedtime. 06/23/21   Raulkar, Drema Pry, MD  mirtazapine (REMERON) 15 MG tablet Take 1 tablet (15 mg total) by mouth at bedtime. Patient not taking: Reported on 09/04/2022 08/31/22   Malachy Mood, MD  MOTEGRITY 1 MG TABS Take by mouth. Patient not taking: Reported on 09/04/2022 01/27/22   [provider]  naproxen sodium (ALEVE) 220 MG tablet Take 220 mg by mouth 2 (two) times daily as needed (Pain).    [provider]  ondansetron (ZOFRAN) 4 MG tablet Take 1 tablet (4 mg total) by mouth every 6 (six) hours. Patient taking differently: Take 4 mg by mouth every 6 (six) hours as needed for nausea or vomiting. 07/15/22   Al Decant, PA-C  oxyCODONE-acetaminophen (PERCOCET) 7.5-325 MG tablet Take 1  tablet by mouth every 8 (eight) hours as needed for moderate pain. Patient taking differently: Take 0.5 tablets by mouth 2 (two) times daily. 08/26/22 08/26/23  Raulkar, Drema Pry, MD  phenylephrine (,USE FOR PREPARATION-H,) 0.25 % suppository Place 1 suppository rectally 2 (two) times daily as needed for hemorrhoids.    [provider]  polyethylene glycol (MIRALAX / GLYCOLAX) 17 g packet Take 17 g by mouth daily. 09/06/22 10/06/22  Marinda Elk, MD  prochlorperazine (COMPAZINE) 10 MG tablet Take 10 mg  by mouth 3 (three) times daily as needed for nausea or vomiting.    [provider]  promethazine (PHENERGAN) 12.5 MG tablet TAKE 1 TABLET(12.5 MG) BY MOUTH EVERY 4 HOURS AS NEEDED FOR NAUSEA OR VOMITING Patient not taking: Reported on 09/04/2022 04/22/22   Erick Colace, MD  senna (SENOKOT) 8.6 MG TABS tablet Take 2 tablets (17.2 mg total) by mouth 2 (two) times daily. 09/06/22   Marinda Elk, MD  Thiamine HCl (B-1 PO) Take 1 tablet by mouth daily.    [provider]      Allergies    Patient has no known allergies.    Review of Systems   Review of Systems  All other systems reviewed and are negative.   Physical Exam Updated Vital Signs BP 101/60   Pulse 64   Temp 97.7 F (36.5 C) (Oral)   Resp 14   SpO2 100%  Physical Exam Vitals and nursing note reviewed.  Constitutional:      General: She is not in acute distress.    Appearance: She is not diaphoretic.  HENT:     Head: Normocephalic and atraumatic.     Mouth/Throat:     Pharynx: No oropharyngeal exudate.  Eyes:     General: No scleral icterus.    Conjunctiva/sclera: Conjunctivae normal.  Cardiovascular:     Rate and Rhythm: Normal rate and regular rhythm.     Pulses: Normal pulses.     Heart sounds: Normal heart sounds.  Pulmonary:     Effort: Pulmonary effort is normal. No respiratory distress.     Breath sounds: Normal breath sounds. No wheezing.  Abdominal:     General: Bowel sounds are normal.     Palpations: Abdomen is soft. There is no mass.     Tenderness: There is generalized abdominal tenderness. There is no guarding or rebound.     Comments: Mild diffuse abdominal TTP.  Musculoskeletal:        General: Normal range of motion.     Cervical back: Normal range of motion and neck supple.  Skin:    General: Skin is warm and dry.  Neurological:     Mental Status: She is alert.     Comments: No focal neurological deficits. Negative pronator drift. Able to ambulate without  assistance or difficulty. Strength and sensation intact to BUE and BLE. Grip strength 5/5 bilaterally.  Normal finger-nose testing.  Normal heel-to-shin testing.  Cranial nerves II through XII intact.  Psychiatric:        Behavior: Behavior normal.     ED Results / Procedures / Treatments   Labs (all labs ordered are listed, but only abnormal results are displayed) Labs Reviewed  CBC WITH DIFFERENTIAL/PLATELET - Abnormal; Notable for the following components:      Result Value   Hemoglobin 10.1 (*)    HCT 32.1 (*)    MCH 25.4 (*)    All other components within normal limits  BASIC METABOLIC PANEL  URINALYSIS, ROUTINE W REFLEX MICROSCOPIC  CBG MONITORING, ED  TROPONIN I (HIGH SENSITIVITY)    EKG EKG Interpretation  Date/Time:  Monday October 05 2022 13:45:57 EDT Ventricular Rate:  56 PR Interval:  127 QRS Duration: 129 QT Interval:  459 QTC Calculation: 443 R Axis:   -64 Text Interpretation: Sinus rhythm Right bundle branch block Inferior infarct, old Probable anteroseptal infarct, old Lateral leads are also involved Confirmed by Kristine Royal [phone]) on 10/05/2022 1:49:35 PM  Radiology DG Chest 2 View  Result Date: 10/05/2022 CLINICAL DATA:  Shortness of breath. EXAM: CHEST - 2 VIEW COMPARISON:  July 15, 2022. FINDINGS: The heart size and mediastinal contours are within normal limits. Left lung is clear. Right upper lobe airspace opacity noted on prior exam is significantly decreased suggesting improving pneumonia, although residual inflammation or postinfectious scarring is noted. The visualized skeletal structures are unremarkable. IMPRESSION: Improved right upper lobe airspace opacity is noted suggesting improving pneumonia, although residual inflammation or postinfectious scarring is noted. Electronically Signed   By: Lupita Raider M.D.   On: 10/05/2022 14:30    Procedures Procedures    Medications Ordered in ED Medications - No data to display  ED Course/ Medical  Decision Making/ A&P                             Medical Decision Making Amount and/or Complexity of Data Reviewed Labs: ordered. Radiology: ordered.   Pt presents with concerns for fatigue onset today. Vital signs, pt afebrile. On exam, pt with no focal neurological deficits. No acute cardiovascular, respiratory, abdominal exam findings. Differential diagnosis includes electrolyte abnormality, anemia, arrhythmia, ACS, pneumonia, intracranial abnormality, hypoglycemia.    Co morbidities that complicate the patient evaluation: hypertension, diabetes, DVT  Additional history obtained:  Additional history obtained from Daughter/Son and Caregiver  Labs:  I ordered, and personally interpreted labs.  The pertinent results include:   CBC unremarkable CBG of 89 Troponin, urinalysis, BNP, hepatic function panel, lipase ordered results pending at time of signout  Imaging: I ordered imaging studies including chest x-ray, CT head I independently visualized and interpreted imaging which showed:  Improved right upper lobe airspace opacity is noted suggesting  improving pneumonia, although residual inflammation or  postinfectious scarring is noted.  CT head and KUB ordered with results pending at time of sign out.  I agree with the radiologist interpretation  Patient case discussed with Jeanelle Malling, PA-C at sign-out. Plan at sign-out is labs and imaging and disposition as per oncoming team, however, plans may change as per oncoming team. Patient care transferred at sign out.    This chart was dictated using voice recognition software, Dragon. Despite the best efforts of this provider to proofread and correct errors, errors may still occur which can change documentation meaning.   Final Clinical Impression(s) / ED Diagnoses Final diagnoses:  Other fatigue    Rx / DC Orders ED Discharge Orders     None         Rosmarie Esquibel A, PA-C 10/05/22 1516    Wynetta Fines, MD 10/05/22  1606
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
## 2022-10-05 NOTE — Progress Notes (Addendum)
Patient's BP 179/70 and apical HR 48. Patient reports dizziness and blurred vision. Provider Melina Schools made aware of patient's symptoms-confirmed understanding. Telemetry ordered and initiated. PRN Hydralazine 10mg  given. Oncoming nurse made aware of patient's vital signs-confirmed understanding. Patient resting in bed with bed lowered, bed alarm initiated, and call bell within reach.
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
## 2022-10-05 NOTE — ED Triage Notes (Addendum)
Pt brought in by EMS from home after family called for AMS. Pt is currently Aox4 but reports being 'sleepy', pt is drowsy and exhibiting signs of lethargy.   Family in the ED is reporting concerns of fatigue, generalized weakness

## 2022-10-06 DIAGNOSIS — N390 Urinary tract infection, site not specified: Secondary | ICD-10-CM | POA: Diagnosis not present

## 2022-10-06 DIAGNOSIS — E43 Unspecified severe protein-calorie malnutrition: Secondary | ICD-10-CM | POA: Insufficient documentation

## 2022-10-06 DIAGNOSIS — N3000 Acute cystitis without hematuria: Secondary | ICD-10-CM | POA: Diagnosis not present

## 2022-10-06 LAB — CBC
HCT: 38.4 % (ref 36.0–46.0)
Hemoglobin: 11.4 g/dL — ABNORMAL LOW (ref 12.0–15.0)
MCH: 24.5 pg — ABNORMAL LOW (ref 26.0–34.0)
MCHC: 29.7 g/dL — ABNORMAL LOW (ref 30.0–36.0)
MCV: 82.4 fL (ref 80.0–100.0)
Platelets: 207 10*3/uL (ref 150–400)
RBC: 4.66 MIL/uL (ref 3.87–5.11)
RDW: 14.7 % (ref 11.5–15.5)
WBC: 8.1 10*3/uL (ref 4.0–10.5)
nRBC: 0 % (ref 0.0–0.2)

## 2022-10-06 LAB — URINE CULTURE: Culture: 50000 — AB

## 2022-10-06 LAB — BASIC METABOLIC PANEL
Anion gap: 15 (ref 5–15)
BUN: 13 mg/dL (ref 8–23)
CO2: 17 mmol/L — ABNORMAL LOW (ref 22–32)
Calcium: 7.9 mg/dL — ABNORMAL LOW (ref 8.9–10.3)
Chloride: 109 mmol/L (ref 98–111)
Creatinine, Ser: 0.87 mg/dL (ref 0.44–1.00)
GFR, Estimated: >60 mL/min (ref >60)
Glucose, Bld: 59 mg/dL — ABNORMAL LOW (ref 70–99)
Potassium: 4 mmol/L (ref 3.5–5.1)
Sodium: 141 mmol/L (ref 135–145)

## 2022-10-06 LAB — CULTURE, BLOOD (ROUTINE X 2): Culture: NO GROWTH

## 2022-10-06 MED ORDER — ENSURE ENLIVE PO LIQD
237.0000 mL | Freq: Two times a day (BID) | ORAL | Status: DC
  Administered 2022-10-06: 237 mL via ORAL

## 2022-10-06 MED ORDER — ALBUTEROL SULFATE (2.5 MG/3ML) 0.083% IN NEBU: 2.5000 mg | INHALATION_SOLUTION | Freq: Four times a day (QID) | RESPIRATORY_TRACT | Status: DC | PRN

## 2022-10-06 MED ORDER — ALBUTEROL SULFATE (2.5 MG/3ML) 0.083% IN NEBU
2.5000 mg | INHALATION_SOLUTION | Freq: Three times a day (TID) | RESPIRATORY_TRACT | Status: DC
  Filled 2022-10-06: qty 3

## 2022-10-06 MED ORDER — ENSURE ENLIVE PO LIQD
237.0000 mL | Freq: Three times a day (TID) | ORAL | Status: DC
  Administered 2022-10-07 (×2): 237 mL via ORAL

## 2022-10-06 MED ORDER — AMLODIPINE BESYLATE 5 MG PO TABS: 5.0000 mg | ORAL_TABLET | Freq: Every day | ORAL | Status: DC

## 2022-10-06 MED ORDER — ADULT MULTIVITAMIN W/MINERALS CH
1.0000 | ORAL_TABLET | Freq: Every day | ORAL | Status: DC
  Administered 2022-10-06 – 2022-10-07 (×2): 1 via ORAL
  Filled 2022-10-06 (×2): qty 1

## 2022-10-06 NOTE — TOC Initial Note (Signed)
Transition of Care [facility]) - Initial/Assessment Note    Patient Details  Name: Emily [name] MRN: [id number] Date of Birth: 1940-07-02  [facility] CM/SW Contact:    Janae Bridgeman, RN Phone Number: 10/06/2022, 3:28 PM  Clinical Narrative:                 Cm called and spoke with the patient's daughter by phone and she states that patient is provided with 24 hour care at the home by family.  Patient is active with [facility] for PT, OT.  New home health orders placed to be co-signed by the MD.  The patient has DME at the home that includes RW, Rolator, hospital bed and diapers at the home.  3:1 is needed along with incontinence pads.  I called [employer] to request delivery of the 3:1 to the patient's home by daughter's request.  The patient will discharge home by car with family once medically stable to discharge.  Expected Discharge Plan: Home w Home Health Services Barriers to Discharge: Continued Medical Work up   Patient Goals and CMS Choice Patient states their goals for this hospitalization and ongoing recovery are:: To return home CMS Medicare.gov Compare Post Acute Care list provided to:: Patient Represenative (must comment) (patient's daughter) Choice offered to / list presented to : Adult Children [place] ownership interest in [facility].provided to:: Adult Children    Expected Discharge Plan and Services   Discharge Planning Services: CM Consult Post Acute Care Choice: Durable Medical Equipment, Home Health, Resumption of Svcs/PTA Provider Living arrangements for the past 2 months: Single Family Home                 DME Arranged: 3-N-1 (Incontinence pads) DME Agency: [facility] Date DME Agency Contacted: 10/06/22 Time DME Agency Contacted: [phone] Representative spoke with at DME Agency: Milas Gain, CM with [employer] HH Arranged: PT, OT HH Agency: [facility] Date [facility] Agency Contacted: 10/06/22 Time HH Agency Contacted:  1524 Representative spoke with at [facility] Agency: Amy, RNCM with [facility]  Prior Living Arrangements/Services Living arrangements for the past 2 months: Single Family Home Lives with:: Relatives Patient language and need for interpreter reviewed:: Yes Do you feel safe going back to the place where you live?: Yes      Need for Family Participation in Patient Care: Yes (Comment) Care giver support system in place?: Yes (comment) Current home services: DME, Home OT, Home PT (has hospital bed, RW and Rolator at the home) Criminal Activity/Legal Involvement Pertinent to Current Situation/Hospitalization: No - Comment as needed  Activities of Daily Living      Permission Sought/Granted Permission sought to share information with : Case Manager, Family Supports Permission granted to share information with : Yes, Verbal Permission Granted     Permission granted to share info w AGENCY: [employer] to provide 3:1 and incontinence pads to be delivered to the home  Permission granted to share info w Relationship: daughter [name]     Emotional Assessment Appearance:: Appears stated age Attitude/Demeanor/Rapport: Gracious Affect (typically observed): Accepting Orientation: : Oriented to Self, Oriented to Place Alcohol / Substance Use: Not Applicable Psych Involvement: No (comment)  Admission diagnosis:  UTI (urinary tract infection) [N39.0] Other fatigue [R53.83] Patient Active Problem List   Diagnosis Date Noted   Acute lower GI bleeding 09/04/2022   Acute blood loss anemia 09/04/2022   Fecal impaction (HCC) 09/04/2022   Urinary incontinence 09/04/2022   Gait abnormality 10/24/2020   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/2020   Postural dizziness 07/05/2019   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   Pain due to onychomycosis of toenails of both feet 11/09/2018   Dermatitis 11/09/2018   Orthostatic syncope 05/24/2018   History of alcohol use 05/24/2018   Vitamin B12  deficiency 05/24/2018   Polypharmacy 05/24/2018   Syncope 05/23/2018   B12 deficiency anemia 05/04/2018   Unilateral primary osteoarthritis, right knee 11/24/2017   Primary osteoarthritis of left knee 11/24/2017   Alcoholic gastritis 06/19/2017   Alcohol consumption binge drinking 06/19/2017   GERD (gastroesophageal reflux disease) 06/19/2017   Hypertension 06/19/2017   History of Gastric outlet obstruction s/p resection 06/19/2017   Chronic pain 06/19/2017   Constipation 05/13/2017   Pain in right lower leg 01/18/2017   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HCC) 09/18/2016   Acute appendicitis 08/21/2016   Chronic pain of left knee 06/01/2016   Chronic pain of right knee 06/01/2016   Cerebral infarction due to unspecified mechanism    Nausea without vomiting 06/26/2014   Small vessel disease, 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   S/P total gastrectomy and Roux-en-Y esophagojejunal anastomosis 2012 10/21/2012   Esophageal dysmotility with poor peristalsis 10/21/2012   Chronic abdominal pain 10/20/2012   Diarrhea 10/20/2012   CAP (community acquired pneumonia) 08/16/2012   Dilation of biliary tract 08/16/2012   Elevated LFTs 08/16/2012   UTI (urinary tract infection) 08/13/2012   Leukocytosis 08/12/2012   Hyponatremia 08/12/2012   Acute kidney failure (HCC) 08/12/2012   Anemia 06/15/2012   Dysphagia 07/30/2010   HYPOKALEMIA 03/11/2010   Nausea with vomiting 06/03/2009   GI BLEED 05/18/2008   Abdominal pain, chronic, epigastric 05/18/2008   ANEMIA, IRON DEFICIENCY 01/24/2008   Essential hypertension 10/07/2007   DIVERTICULOSIS, COLON 10/07/2007   DM2 (diabetes mellitus, type 2) (HCC) 10/02/2006   DISORDER, DEPRESSIVE NEC 10/02/2006   GERD 10/02/2006   PCP:  Wilmer Floor, NP Pharmacy:   [facility] [phone] - [place], [place] - [address] AT [facility] [address] [place] [address] Phone: [phone] Fax:  [phone]  [id number] - [place] - [address] [place]) [place] [address] Phone: [phone] Fax: [phone]  [id number] [place], [place] - [address] AT [address] [place] [address] Phone: [phone] Fax: [phone]     Social Determinants of Health (SDOH) Social History: SDOH Screenings   Depression (PHQ2-9): Low Risk  (05/07/2022)  Tobacco Use: High Risk (10/05/2022)   SDOH Interventions:     Readmission Risk Interventions     No data to display

## 2022-10-06 NOTE — Progress Notes (Signed)
PROGRESS NOTE  Emily [name]  DOB: 08/19/1940  PCP: Wilmer Floor, NP WUJ:[id number]  DOA: 10/05/2022  LOS: 0 days  Hospital Day: 2  Brief narrative: Emily [name] is a 82 y.o. female with PMH significant for dementia, DM2, HTN, HLD, sleep apnea, aortic atherosclerosis, diverticulosis, history of hiatal hernia surgery, GERD, gastroparesis, constipation, anxiety/depression who lives at home with her son's family.  At baseline, she is barely able to ambulate inside the home with a walker and is mostly independent.  She has dementia but without behavioral symptoms. Patient brought to the ED from home by EMS after family called for altered mental status. Family reports excessive sleepiness and generalized weakness worsening for 2 days.  In the ED, patient was afebrile, heart rate in 60s, blood pressure 101/60, breathing on room air Labs with WBC count 5.3, hemoglobin 10.1, BMP unremarkable, troponin normal Urinalysis showed cloudy amber-colored urine with large leukocytes, small hemoglobin, few bacteria and protein of 30. CT did not show any acute abnormality but showed mild diffuse atrophy and mild chronic small vessel ischemic change. Abdominal x-ray showed nonobstructive bowel gas pattern. Admitted to [facility]  Subjective: Patient was seen and examined this morning.  Pleasant elderly AA female. Gradually improving mental status.  More awake today than yesterday. No family at bedside.   ------------------------------------------------------------------------------------------------------ Hospital course: Acute metabolic encephalopathy Likely secondary to UTI Presented with excessive sleepiness, generalized weakness Noted to have abnormal urinalysis with large leukocytes, few bacteria Currently on IV Rocephin Pending urine culture, blood culture report CT head unremarkable. Recent Labs  Lab 10/05/22 1402 10/06/22 0714  WBC 5.3 8.1   Type 2 diabetes mellitus A1c 5.3 in May PTA not  on meds Recent Labs  Lab 10/05/22 1357  GLUCAP 89   Hypertension Blood pressure in normal range. Home med list pending.  Aortic atherosclerosis  HLD Home meds pending  History of hiatal hernia GERD PPI  Constipation Continue Senokot.  MiraLAX as needed ordered  Generalized weakness PT eval ordered.  Goals of care   Code Status: Full Code    DVT prophylaxis:  enoxaparin (LOVENOX) injection 30 mg Start: 10/05/22 1900   Antimicrobials: IV Rocephin Fluid: Okay to stop fluid Consultants: None Family Communication: Family not at bedside today.  Status: Observation Level of care:  Med-Surg   Patient from: Home Anticipated d/c to: Home with home health hopefully tomorrow Needs to continue in-hospital care:  Pending urine culture, blood culture report.  On IV antibiotics  Scheduled Meds:  enoxaparin (LOVENOX) injection  30 mg Subcutaneous Q24H   feeding supplement  237 mL Oral BID BM   pantoprazole  40 mg Oral Daily   senna-docusate  1 tablet Oral QHS    PRN meds: acetaminophen **OR** acetaminophen, albuterol, hydrALAZINE, ondansetron (ZOFRAN) IV, oxyCODONE, oxyCODONE-acetaminophen, polyethylene glycol   Infusions:   cefTRIAXone (ROCEPHIN)  IV Stopped (10/06/22 0319)    Diet:  Diet Order             Diet regular Room service appropriate? Yes; Fluid consistency: Thin  Diet effective now                   Antimicrobials: Anti-infectives (From admission, onward)    Start     Dose/Rate Route Frequency Ordered Stop   10/05/22 1700  cefTRIAXone (ROCEPHIN) 1 g in sodium chloride 0.9 % 100 mL IVPB        1 g 200 mL/hr over 30 Minutes Intravenous Every 24 hours 10/05/22 1630  Skin assessment:       Nutritional status:  Body mass index is 12.05 kg/m.          Objective: Vitals:   10/06/22 0512 10/06/22 0822  BP: (!) 147/70 133/60  Pulse: 71 72  Resp:    Temp: 98.1 F (36.7 C) 97.8 F (36.6 C)  SpO2: 100% 100%     Intake/Output Summary (Last 24 hours) at 10/06/2022 1327 Last data filed at 10/06/2022 0320 Gross per 24 hour  Intake 725.87 ml  Output --  Net 725.87 ml   Filed Weights   10/05/22 1347  Weight: 30.8 kg   Weight change:  Body mass index is 12.05 kg/m.   Physical Exam: General exam: Pleasant, elderly African-[name].  Not in distress.  Somnolent Skin: No rashes, lesions or ulcers. HEENT: Atraumatic, normocephalic, no obvious bleeding Lungs: Clear to auscultation bilaterally CVS: Regular rate and rhythm, no murmur GI/Abd soft, nontender, nondistended, bowel sound present CNS: More alert, awake, able to answer yes no questions Psychiatry: Sad affect Extremities: No pedal edema, no calf tenderness  Data Review: I have personally reviewed the laboratory data and studies available.  F/u labs  Unresulted Labs (From admission, onward)     Start     Ordered   10/05/22 1630  Culture, blood (Routine X 2) w Reflex to ID Panel  BLOOD CULTURE X 2,   R      10/05/22 1630            Total time spent in review of labs and imaging, patient evaluation, formulation of plan, documentation and communication with family: 45 minutes  Signed, Lorin Glass, MD [facility] 10/06/2022

## 2022-10-06 NOTE — Progress Notes (Addendum)
    Durable Medical Equipment  (From admission, onward)           Start     Ordered   10/06/22 1517  For home use only DME Other see comment  Once       Comments: Incontinence pads for the hospital bed  Question:  Length of Need  Answer:  Lifetime   10/06/22 1516   10/06/22 1517  For home use only DME Bedside commode  Once       Comments: Patient is confined to one room for care in the home.  Please provide a 3:1 to be shipped to the home.  Question:  Patient needs a bedside commode to treat with the following condition  Answer:  Generalized weakness   10/06/22 1517

## 2022-10-06 NOTE — Progress Notes (Signed)
Initial Nutrition Assessment  DOCUMENTATION CODES:   Severe malnutrition in context of chronic illness  INTERVENTION:  - Add Ensure Enlive po TID, each supplement provides 350 kcal and 20 grams of protein.  - Add MVI q day.   NUTRITION DIAGNOSIS:   Severe Malnutrition related to chronic illness as evidenced by severe fat depletion, severe muscle depletion, energy intake < 75% for > or equal to 1 month, percent weight loss.  GOAL:   Patient will meet greater than or equal to 90% of their needs  MONITOR:   PO intake, Supplement acceptance  REASON FOR ASSESSMENT:   Consult Assessment of nutrition requirement/status  ASSESSMENT:   82 y.o. female admits related to weakness. PMH includes: dementia, T2DM, HTN, HLD, sleep apnea, aortic atherosclerosis, diverticulosis, GERD, gastroparesis. Pt is currently receiving medical management related to acute metabolic encephalopathy likely secondary to UTI.  Meds reviewed: senokot.  Labs reviewed: WDL.   The pt reports that she was previously not eating well for months PTA. Per record, pt has experienced a 21% wt loss in the past 5 months which is significant. Pt reports that her appetite has just recently started to improve. The pt reports that she drinks Boost TID at home. RD will add Ensure TID for now. RD will continue to monitor PO intakes.   NUTRITION - FOCUSED PHYSICAL EXAM:  Flowsheet Row Most Recent Value  Orbital Region Severe depletion  Upper Arm Region Severe depletion  Thoracic and Lumbar Region Severe depletion  Buccal Region Severe depletion  Temple Region Severe depletion  Clavicle Bone Region Severe depletion  Clavicle and Acromion Bone Region Severe depletion  Scapular Bone Region Severe depletion  Dorsal Hand Severe depletion  Patellar Region Severe depletion  Anterior Thigh Region Severe depletion  Posterior Calf Region Severe depletion  Edema (RD Assessment) None  Hair Reviewed  Eyes Reviewed  Mouth Reviewed   Skin Reviewed  Nails Reviewed       Diet Order:   Diet Order             Diet regular Room service appropriate? Yes; Fluid consistency: Thin  Diet effective now                   EDUCATION NEEDS:   Not appropriate for education at this time  Skin:  Skin Assessment: Reviewed RN Assessment  Last BM:  10/04/22  Height:   Ht Readings from Last 1 Encounters:  10/05/22 5\' 3"  (1.6 m)    Weight:   Wt Readings from Last 1 Encounters:  10/05/22 30.8 kg    Ideal Body Weight:     BMI:  Body mass index is 12.05 kg/m.  Estimated Nutritional Needs:   Kcal:  1200-1400 kcals  Protein:  60-70 gm  Fluid:  >/= 1.2 L  Bethann Humble, RD, LDN, CNSC.
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
## 2022-10-06 NOTE — Progress Notes (Signed)
Per patient, if her daughter is unable to come get the patient before the evening, she would like her caregiver, Alyse Low [phone]) to do so.

## 2022-10-06 NOTE — Inpatient Diabetes Management (Signed)
Inpatient Diabetes Program Recommendations  AACE/ADA: New Consensus Statement on Inpatient Glycemic Control (2015)  Target Ranges:  Prepandial:   less than 140 mg/dL      Peak postprandial:   less than 180 mg/dL (1-2 hours)      Critically ill patients:  140 - 180 mg/dL   Lab Results  Component Value Date   GLUCAP 89 10/05/2022   HGBA1C 5.3 09/04/2022    Review of Glycemic Control  Latest Reference Range & Units 10/06/22 07:14  Glucose 70 - 99 mg/dL 59 (L)  (L): Data is abnormally low  Diabetes history:  PreDM Outpatient Diabetes medications: none Current orders for Inpatient glycemic control: none  Inpatient Diabetes Program Recommendations:    Hypoglycemia on glucose serum this AM. May want to consider adding CBGs TID.  Thanks, Lujean Rave, MSN, RNC-OB Diabetes Coordinator [phone] (8a-5p)

## 2022-10-07 ENCOUNTER — Encounter: Payer: Self-pay | Admitting: Hematology

## 2022-10-07 ENCOUNTER — Other Ambulatory Visit (HOSPITAL_COMMUNITY): Payer: Self-pay

## 2022-10-07 DIAGNOSIS — N3000 Acute cystitis without hematuria: Secondary | ICD-10-CM | POA: Diagnosis not present

## 2022-10-07 DIAGNOSIS — N390 Urinary tract infection, site not specified: Secondary | ICD-10-CM | POA: Diagnosis not present

## 2022-10-07 LAB — CULTURE, BLOOD (ROUTINE X 2): Culture: NO GROWTH

## 2022-10-07 MED ORDER — POLYETHYLENE GLYCOL 3350 17 GM/SCOOP PO POWD
17.0000 g | Freq: Every day | ORAL | 0 refills | Status: AC
  Filled 2022-10-07: qty 478, 28d supply, fill #0

## 2022-10-07 MED ORDER — NITROFURANTOIN MONOHYD MACRO 100 MG PO CAPS
100.0000 mg | ORAL_CAPSULE | Freq: Two times a day (BID) | ORAL | 0 refills | Status: DC
  Filled 2022-10-07: qty 10, 5d supply, fill #0

## 2022-10-07 NOTE — Discharge Summary (Addendum)
Physician Discharge Summary  [name] EAV:[id number] DOB: [birth year] DOA: 10/05/2022  PCP: Wilmer Floor, NP  Admit date: 10/05/2022 Discharge date: 10/07/2022    Admitted From: Home Disposition: Home  Recommendations for Outpatient Follow-up:  Follow up with PCP in 1-2 weeks Please obtain BMP/CBC in one week Please follow up with your PCP on the following pending results: Unresulted Labs (From admission, onward)    None         Home Health: Yes Equipment/Devices: None  Discharge Condition: Stable CODE STATUS: Full code Diet recommendation: Cardiac  Subjective: Seen and examined.  No complaints.  Brief/Interim Summary: Emily [name] is a 82 y.o. female with PMH significant for dementia, DM2, HTN, HLD, sleep apnea, aortic atherosclerosis, diverticulosis, history of hiatal hernia surgery, GERD, gastroparesis, constipation, anxiety/depression who lives at home with her son's family.  At baseline, she is barely able to ambulate inside the home with a walker and is mostly independent.  She has dementia but without behavioral symptoms. Patient brought to the ED from home by EMS after family called for altered mental status. Family reports excessive sleepiness and generalized weakness worsening for 2 days.   In the ED, patient was afebrile, heart rate in 60s, blood pressure 101/60, breathing on room air. Labs with WBC count 5.3, hemoglobin 10.1, BMP unremarkable, troponin normal. Urinalysis showed cloudy amber-colored urine with large leukocytes, small hemoglobin, few bacteria and protein of 30. CT did not show any acute abnormality but showed mild diffuse atrophy and mild chronic small vessel ischemic change. Abdominal x-ray showed nonobstructive bowel gas pattern. Admitted to [facility] secondary to acute metabolic encephalopathy which was thought to be secondary to UTI.  She was started on Rocephin.  Urine culture grew only yeast, no bacteria.  Patient is now alert and oriented, back at her  baseline.  She was assessed by PT OT and they recommended home health.  Patient is stable for discharge.  Patient received 3 doses of Rocephin.  There is no strong evidence of UTI anyways.  Type 2 diabetes mellitus A1c 5.3 in May PTA not on meds  Hypertension Blood pressure in normal range.  Resume home meds.   Aortic atherosclerosis  HLD Home meds pending   History of hiatal hernia GERD PPI  Discharge plan was discussed with patient and/or family member and they verbalized understanding and agreed with it.  Discharge Diagnoses:  Principal Problem:   UTI (urinary tract infection) Active Problems:   Protein-calorie malnutrition, severe    Discharge Instructions   Allergies as of 10/07/2022   No Known Allergies      Medication List     STOP taking these medications    amitriptyline 10 MG tablet Commonly known as: ELAVIL   gabapentin 300 MG capsule Commonly known as: Neurontin   meclizine 12.5 MG tablet Commonly known as: ANTIVERT   mirtazapine 15 MG tablet Commonly known as: REMERON   polyethylene glycol 17 g packet Commonly known as: MIRALAX / GLYCOLAX Replaced by: polyethylene glycol powder 17 GM/SCOOP powder   promethazine 12.5 MG tablet Commonly known as: PHENERGAN   senna 8.6 MG Tabs tablet Commonly known as: SENOKOT       TAKE these medications    ammonium lactate 12 % cream Commonly known as: Lac-Hydrin Apply 1 Application topically as needed for dry skin. What changed: when to take this   B-1 PO Take 1 tablet by mouth daily.   B-12 COMPLIANCE INJECTION IJ Inject 1 Dose as directed every 14 (fourteen) days.   dicyclomine  10 MG capsule Commonly known as: BENTYL TAKE 1 CAPSULE BY MOUTH EVERY 8 HOURS AS NEEDED What changed: See the new instructions.   Ensure Take 237 mLs by mouth 3 (three) times daily between meals.   esomeprazole 20 MG capsule Commonly known as: NEXIUM TAKE 1 CAPSULE(20 MG) BY MOUTH DAILY AS NEEDED FOR GERD What  changed: See the new instructions.   ferrous sulfate 325 (65 FE) MG EC tablet Take 1 tablet (325 mg total) by mouth daily with breakfast.   lidocaine 5 % Commonly known as: LIDODERM USE 1 PATCH EXTERNALLY ONE DAILY REMOVE AND DISCARD WITHIN 12 HOURS. What changed:  how much to take how to take this when to take this reasons to take this additional instructions   losartan 25 MG tablet Commonly known as: COZAAR Take 25 mg by mouth daily.   Melatonin 3 MG Caps Take 1 capsule (3 mg total) by mouth at bedtime.   naproxen sodium 220 MG tablet Commonly known as: ALEVE Take 220 mg by mouth 2 (two) times daily as needed (Pain).   ondansetron 4 MG tablet Commonly known as: ZOFRAN Take 1 tablet (4 mg total) by mouth every 6 (six) hours. What changed:  when to take this reasons to take this   oxyCODONE-acetaminophen 7.5-325 MG tablet Commonly known as: Percocet Take 1 tablet by mouth every 8 (eight) hours as needed for moderate pain. What changed:  how much to take when to take this   polyethylene glycol powder 17 GM/SCOOP powder Commonly known as: GLYCOLAX/MIRALAX Take 17 g (1 capfull) by mouth daily. Replaces: polyethylene glycol 17 g packet               Durable Medical Equipment  (From admission, onward)           Start     Ordered   10/06/22 1517  For home use only DME Other see comment  Once       Comments: Incontinence pads for the hospital bed  Question:  Length of Need  Answer:  Lifetime   10/06/22 1516   10/06/22 1517  For home use only DME Bedside commode  Once       Comments: Patient is confined to one room for care in the home.  Please provide a 3:1 to be shipped to the home.  Question:  Patient needs a bedside commode to treat with the following condition  Answer:  Generalized weakness   10/06/22 1517            Follow-up Information     [facility]   Why: [facility] will continue  to provide home health services for PT/OT. Contact information: [address] [place] [address] [phone]         [facility] Follow up.   Why: [facility] will provide 3:1 and likely incontinence pads to your home for delivery Contact information: [address] [address] [phone]         Wilmer Floor, NP Follow up in 1 week(s).   Contact information: [address] [place] [address] [phone]                No Known Allergies  Consultations: None   Procedures/Studies: DG Abdomen 1 View  Result Date: 10/05/2022 CLINICAL DATA:  Altered mental status and abdominal pain EXAM: ABDOMEN - 1 VIEW COMPARISON:  Abdominal radiograph dated 09/04/2022 FINDINGS: Nonobstructive bowel gas pattern. No free  air or pneumatosis. No abnormal radio-opaque calculi or mass effect. No acute or substantial osseous abnormality. The sacrum and coccyx are partially obscured by overlying bowel contents. Surgical chain sutures in the left upper quadrant and right upper quadrant surgical clips. Vascular calcifications. IMPRESSION: Nonobstructive bowel gas pattern. Electronically Signed   By: Agustin Cree M.D.   On: 10/05/2022 15:37   CT Head Wo Contrast  Result Date: 10/05/2022 CLINICAL DATA:  Mental status change EXAM: CT HEAD WITHOUT CONTRAST TECHNIQUE: Contiguous axial images were obtained from the base of the skull through the vertex without intravenous contrast. RADIATION DOSE REDUCTION: This exam was performed according to the departmental dose-optimization program which includes automated exposure control, adjustment of the mA and/or kV according to patient size and/or use of iterative reconstruction technique. COMPARISON:  Head CT 03/10/2021.  MRI head 05/09/2019. FINDINGS: Brain: No evidence of acute infarction, hemorrhage, hydrocephalus, extra-axial collection or mass lesion/mass effect. There is mild diffuse atrophy and mild  periventricular white matter hypodensity, likely chronic small vessel ischemic change. Vascular: Atherosclerotic calcifications are present within the cavernous internal carotid arteries. Skull: Normal. Negative for fracture or focal lesion. Sinuses/Orbits: No acute finding. Chronic right nasal bone fractures. Other: None. IMPRESSION: 1. No acute intracranial abnormality. 2. Mild diffuse atrophy and mild chronic small vessel ischemic change. Electronically Signed   By: Darliss Cheney M.D.   On: 10/05/2022 15:28   DG Chest 2 View  Result Date: 10/05/2022 CLINICAL DATA:  Shortness of breath. EXAM: CHEST - 2 VIEW COMPARISON:  July 15, 2022. FINDINGS: The heart size and mediastinal contours are within normal limits. Left lung is clear. Right upper lobe airspace opacity noted on prior exam is significantly decreased suggesting improving pneumonia, although residual inflammation or postinfectious scarring is noted. The visualized skeletal structures are unremarkable. IMPRESSION: Improved right upper lobe airspace opacity is noted suggesting improving pneumonia, although residual inflammation or postinfectious scarring is noted. Electronically Signed   By: Lupita Raider M.D.   On: 10/05/2022 14:30     Discharge Exam: Vitals:   10/07/22 0804 10/07/22 1228  BP: (!) 156/92 137/89  Pulse: 69 71  Resp: 16 16  Temp: 98.4 F (36.9 C) 98.2 F (36.8 C)  SpO2: 100% 100%   Vitals:   10/06/22 1945 10/07/22 0423 10/07/22 0804 10/07/22 1228  BP: (!) 150/80 (!) 156/82 (!) 156/92 137/89  Pulse: 78 61 69 71  Resp: 18 17 16 16   Temp: 99.2 F (37.3 C) 98.5 F (36.9 C) 98.4 F (36.9 C) 98.2 F (36.8 C)  TempSrc:    Oral  SpO2: 99% 100% 100% 100%  Weight:      Height:        General: Pt is alert, awake, not in acute distress Cardiovascular: RRR, S1/S2 +, no rubs, no gallops Respiratory: CTA bilaterally, no wheezing, no rhonchi Abdominal: Soft, NT, ND, bowel sounds + Extremities: no edema, no  cyanosis    The results of significant diagnostics from this hospitalization (including imaging, microbiology, ancillary and laboratory) are listed below for reference.     Microbiology: Recent Results (from the past 240 hour(s))  Urine Culture     Status: Abnormal   Collection Time: 10/05/22  2:40 PM   Specimen: Urine, Clean Catch  Result Value Ref Range Status   Specimen Description URINE, CLEAN CATCH  Final   Special Requests   Final    NONE Performed at [facility], [address] [place], [place] [address]    Culture 50,000  COLONIES/mL YEAST (A)  Final   Report Status 10/06/2022 FINAL  Final  Culture, blood (Routine X 2) w Reflex to ID Panel     Status: None (Preliminary result)   Collection Time: 10/05/22  5:25 PM   Specimen: BLOOD LEFT FOREARM  Result Value Ref Range Status   Specimen Description BLOOD LEFT FOREARM  Final   Special Requests   Final    BOTTLES DRAWN AEROBIC AND ANAEROBIC Blood Culture results may not be optimal due to an inadequate volume of blood received in culture bottles   Culture   Final    NO GROWTH 2 DAYS Performed at [facility], [address] [place], [place] [address]    Report Status PENDING  Incomplete  Culture, blood (Routine X 2) w Reflex to ID Panel     Status: None (Preliminary result)   Collection Time: 10/06/22 12:34 AM   Specimen: BLOOD LEFT HAND  Result Value Ref Range Status   Specimen Description BLOOD LEFT HAND  Final   Special Requests   Final    BOTTLES DRAWN AEROBIC AND ANAEROBIC Blood Culture adequate volume   Culture   Final    NO GROWTH 1 DAY Performed at [facility], [address], [place], [place] [address]    Report Status PENDING  Incomplete     Labs: BNP (last 3 results) No results for input(s): "BNP" in the last 8760 hours. Basic Metabolic Panel: Recent Labs  Lab 10/05/22 1402 10/06/22 0714  NA 139 141  K 3.5 4.0  CL 107 109  CO2 24 17*  GLUCOSE 88 59*  BUN 16 13  CREATININE  0.94 0.87  CALCIUM 7.9* 7.9*   Liver Function Tests: Recent Labs  Lab 10/05/22 1552  AST 19  ALT 16  ALKPHOS 57  BILITOT 0.5  PROT 3.9*  ALBUMIN <1.5*   Recent Labs  Lab 10/05/22 1552  LIPASE 23   No results for input(s): "AMMONIA" in the last 168 hours. CBC: Recent Labs  Lab 10/05/22 1402 10/06/22 0714  WBC 5.3 8.1  NEUTROABS 3.0  --   HGB 10.1* 11.4*  HCT 32.1* 38.4  MCV 80.7 82.4  PLT 212 207   Cardiac Enzymes: No results for input(s): "CKTOTAL", "CKMB", "CKMBINDEX", "TROPONINI" in the last 168 hours. BNP: Invalid input(s): "POCBNP" CBG: Recent Labs  Lab 10/05/22 1357  GLUCAP 89   D-Dimer No results for input(s): "DDIMER" in the last 72 hours. Hgb A1c No results for input(s): "HGBA1C" in the last 72 hours. Lipid Profile No results for input(s): "CHOL", "HDL", "LDLCALC", "TRIG", "CHOLHDL", "LDLDIRECT" in the last 72 hours. Thyroid function studies No results for input(s): "TSH", "T4TOTAL", "T3FREE", "THYROIDAB" in the last 72 hours.  Invalid input(s): "FREET3" Anemia work up No results for input(s): "VITAMINB12", "FOLATE", "FERRITIN", "TIBC", "IRON", "RETICCTPCT" in the last 72 hours. Urinalysis    Component Value Date/Time   COLORURINE AMBER (A) 10/05/2022 1440   APPEARANCEUR CLOUDY (A) 10/05/2022 1440   LABSPEC 1.016 10/05/2022 1440   PHURINE 5.0 10/05/2022 1440   GLUCOSEU NEGATIVE 10/05/2022 1440   GLUCOSEU NEGATIVE 06/22/2014 1426   HGBUR SMALL (A) 10/05/2022 1440   BILIRUBINUR NEGATIVE 10/05/2022 1440   KETONESUR NEGATIVE 10/05/2022 1440   PROTEINUR 30 (A) 10/05/2022 1440   UROBILINOGEN 1.0 02/23/2015 1917   NITRITE NEGATIVE 10/05/2022 1440   LEUKOCYTESUR LARGE (A) 10/05/2022 1440   Sepsis Labs Recent Labs  Lab 10/05/22 1402 10/06/22 0714  WBC 5.3 8.1   Microbiology Recent Results (  from the past 240 hour(s))  Urine Culture     Status: Abnormal   Collection Time: 10/05/22  2:40 PM   Specimen: Urine, Clean Catch  Result Value Ref  Range Status   Specimen Description URINE, CLEAN CATCH  Final   Special Requests   Final    NONE Performed at [facility], [address], [place], [place] [address]    Culture 50,000 COLONIES/mL YEAST (A)  Final   Report Status 10/06/2022 FINAL  Final  Culture, blood (Routine X 2) w Reflex to ID Panel     Status: None (Preliminary result)   Collection Time: 10/05/22  5:25 PM   Specimen: BLOOD LEFT FOREARM  Result Value Ref Range Status   Specimen Description BLOOD LEFT FOREARM  Final   Special Requests   Final    BOTTLES DRAWN AEROBIC AND ANAEROBIC Blood Culture results may not be optimal due to an inadequate volume of blood received in culture bottles   Culture   Final    NO GROWTH 2 DAYS Performed at [facility], [address], [place], [place] [address]    Report Status PENDING  Incomplete  Culture, blood (Routine X 2) w Reflex to ID Panel     Status: None (Preliminary result)   Collection Time: 10/06/22 12:34 AM   Specimen: BLOOD LEFT HAND  Result Value Ref Range Status   Specimen Description BLOOD LEFT HAND  Final   Special Requests   Final    BOTTLES DRAWN AEROBIC AND ANAEROBIC Blood Culture adequate volume   Culture   Final    NO GROWTH 1 DAY Performed at [facility], [address], [place], [place] [address]    Report Status PENDING  Incomplete     Time coordinating discharge: Over 30 minutes  SIGNED:   Hughie Closs, MD  [facility] 10/07/2022, 1:20 PM *Please note that this is a verbal dictation therefore any spelling or grammatical errors are due to the "[employer] One" system interpretation. If 7PM-7AM, please contact night-coverage [email or web address]

## 2022-10-08 LAB — CULTURE, BLOOD (ROUTINE X 2)

## 2022-10-09 LAB — CULTURE, BLOOD (ROUTINE X 2)

## 2022-10-10 LAB — CULTURE, BLOOD (ROUTINE X 2)

## 2022-10-11 LAB — CULTURE, BLOOD (ROUTINE X 2): Special Requests: ADEQUATE

## 2022-10-16 IMAGING — MR MR ABDOMEN WO/W CM
19 of 20 series · 46 of 48 positions shown · IV contrast (gadavist)
Comparison: CT abdomen 05/12/2021

CLINICAL DATA: Epigastric pain

EXAM:
MRI ABDOMEN WITHOUT AND WITH CONTRAST
TECHNIQUE: Multiplanar multisequence MR imaging of the abdomen was performed
both before and after the administration of intravenous contrast.
CONTRAST:  5mL GADAVIST GADOBUTROL 1 MMOL/ML IV SOLN

[Series 2: DWI · axial · 6.0mm · 1.49mm/px · z∈[-69,+183]mm · 4 of 72 slices shown (1 of 2)]
[im 1/72]
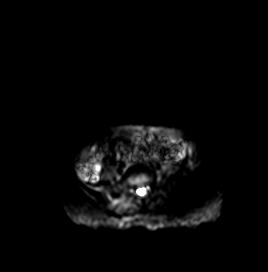
[im 24/72]
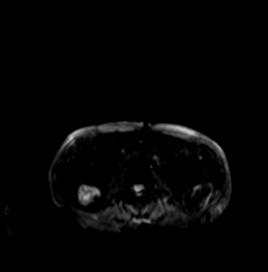
[im 48/72]
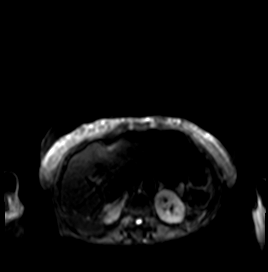
[im 72/72]
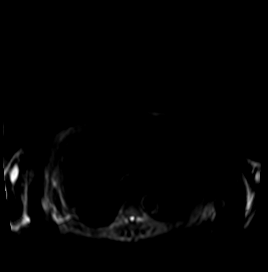

[Series 3: DWI · axial · 6.0mm · 1.49mm/px · 1 of 36 slices shown (2 of 2)]
[im 1/36]
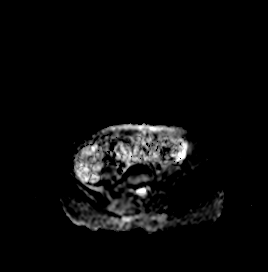

[Series 4: T2 fat-sat · axial · 6.0mm · 1.25mm/px · 1 of 36 slices shown (1 of 2)]
[im 1/36]
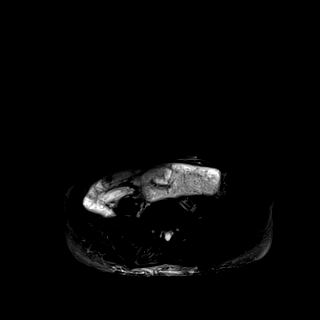

[Series 5: T2 fat-sat · 1 of 3 slices shown (2 of 2)]
[im 1/3]
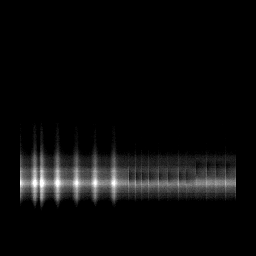

[Series 7: T2 · coronal · 7.0mm · 1.25mm/px · 1 of 22 slices shown (1 of 2)]
[im 1/22]
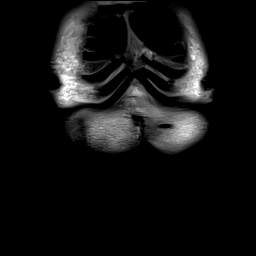

[Series 8: T1 · axial · 3.1mm · 0.94mm/px · z∈[-75,+170]mm · 3 of 80 slices shown (1 of 2)]
[im 1/80]
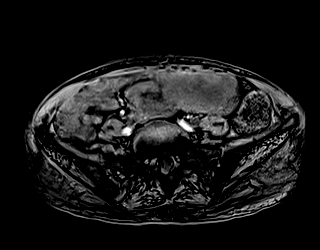
[im 40/80]
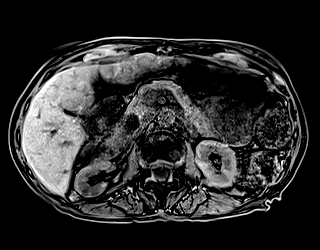
[im 80/80]
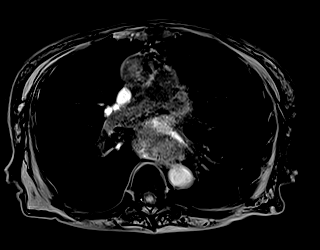

[Series 9: T1 · axial · 3.1mm · 0.94mm/px · z∈[-75,+170]mm · 3 of 80 slices shown (2 of 2)]
[im 1/80]
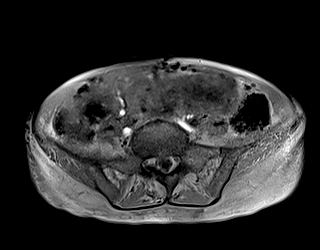
[im 40/80]
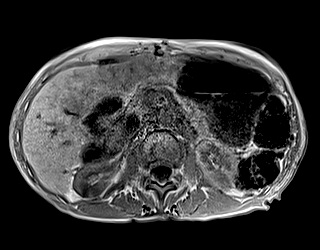
[im 80/80]
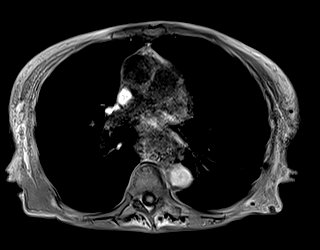

[Series 10: bSSFP · axial · 7.0mm · 0.94mm/px · 1 of 30 slices shown]
[im 1/30]
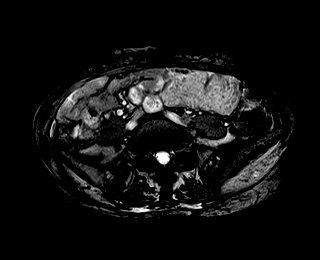

[Series 13: T1 dynamic · axial · 3.0mm · 0.94mm/px · z∈[-53,+184]mm · 3 of 80 slices shown (1 of 10)]
[im 1/80]
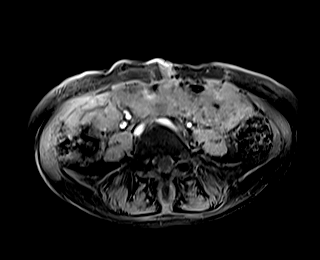
[im 40/80]
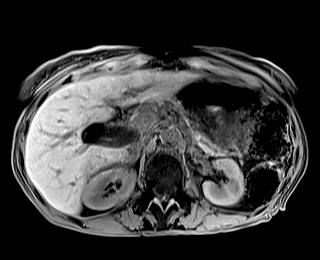
[im 80/80]
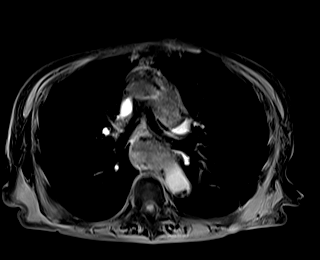

[Series 17: T1 dynamic · axial · 3.0mm · 0.94mm/px · z∈[-53,+184]mm · 3 of 80 slices shown (2 of 10)]
[im 1/80]
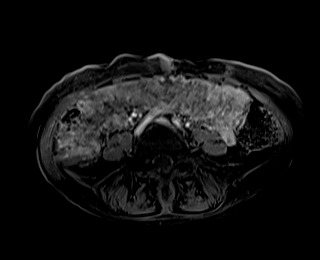
[im 40/80]
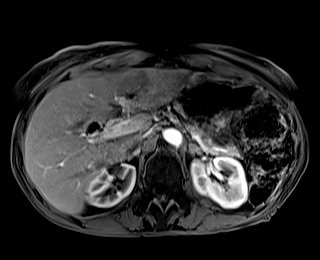
[im 80/80]
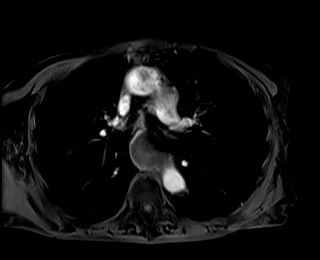

[Series 18: T1 dynamic · axial · 3.0mm · 0.94mm/px · z∈[-53,+184]mm · 3 of 80 slices shown (3 of 10)]
[im 1/80]
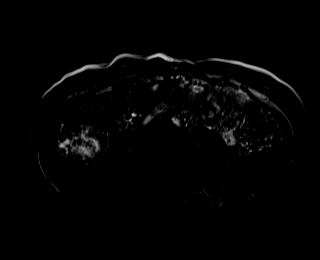
[im 40/80]
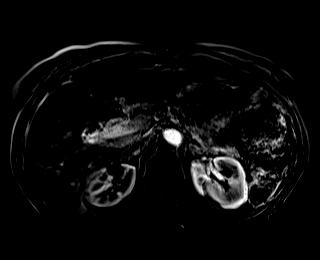
[im 80/80]
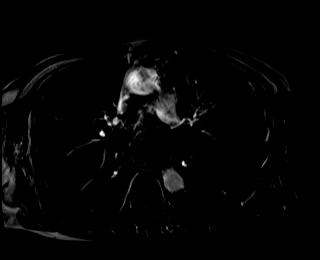

[Series 21: T1 dynamic · axial · 3.0mm · 0.94mm/px · z∈[-53,+184]mm · 3 of 80 slices shown (4 of 10)]
[im 1/80]
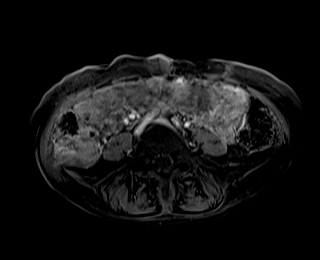
[im 40/80]
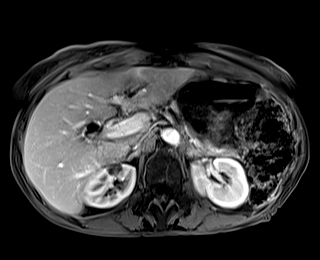
[im 80/80]
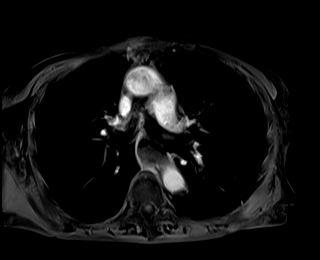

[Series 22: T1 dynamic · axial · 3.0mm · 0.94mm/px · z∈[-53,+184]mm · 3 of 80 slices shown (5 of 10)]
[im 1/80]
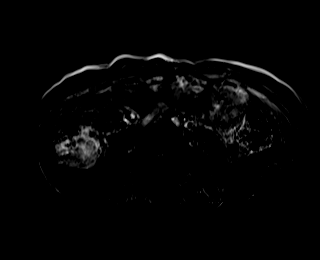
[im 40/80]
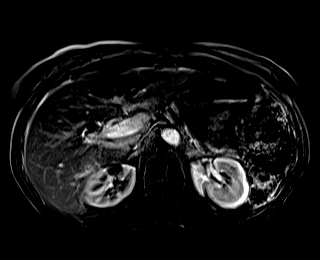
[im 80/80]
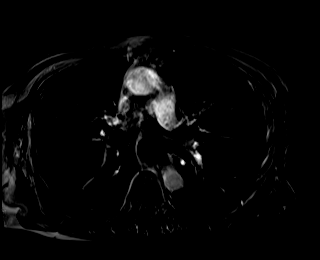

[Series 25: T1 dynamic · axial · 3.0mm · 0.94mm/px · z∈[-53,+184]mm · 3 of 80 slices shown (6 of 10)]
[im 1/80]
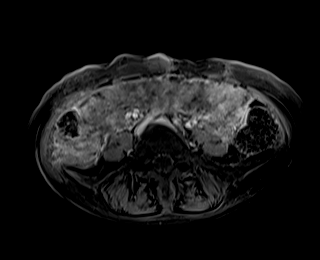
[im 40/80]
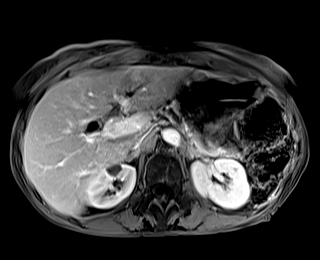
[im 80/80]
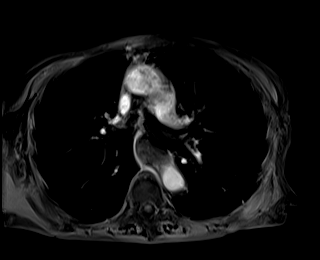

[Series 26: T1 dynamic · axial · 3.0mm · 0.94mm/px · z∈[-53,+184]mm · 3 of 80 slices shown (7 of 10)]
[im 1/80]
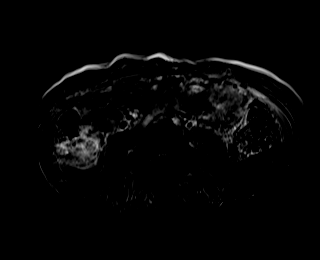
[im 40/80]
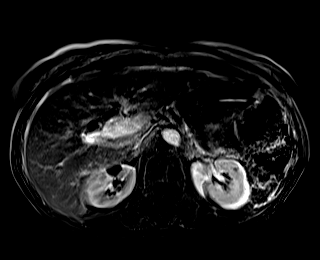
[im 80/80]
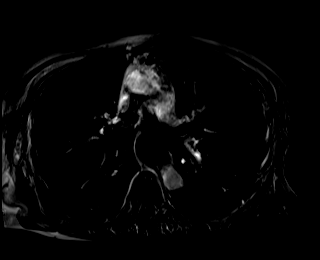

[Series 28: T1 dynamic · coronal · 3.0mm · 1.25mm/px · 3 of 64 slices shown (8 of 10)]
[im 1/64]
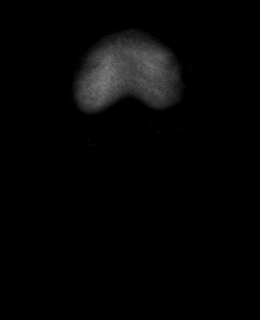
[im 32/64]
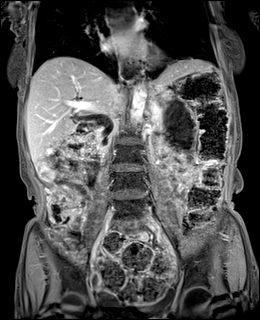
[im 64/64]
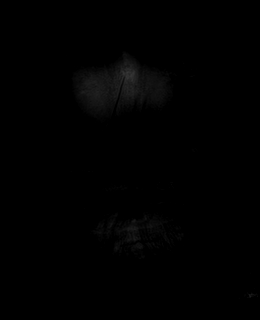

[Series 29: T2 · axial · 6.0mm · 1.17mm/px · 1 of 30 slices shown (2 of 2)]
[im 1/30]
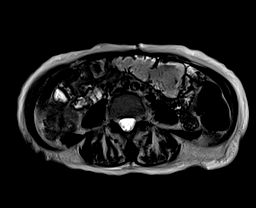

[Series 32: T1 dynamic · axial · 3.0mm · 0.94mm/px · z∈[-53,+184]mm · 3 of 80 slices shown (9 of 10)]
[im 1/80]
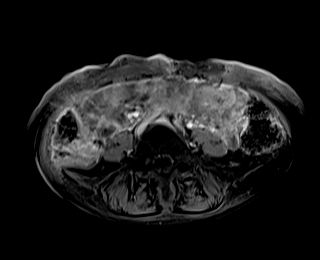
[im 40/80]
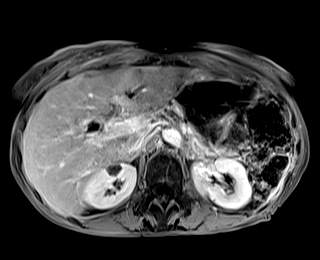
[im 80/80]
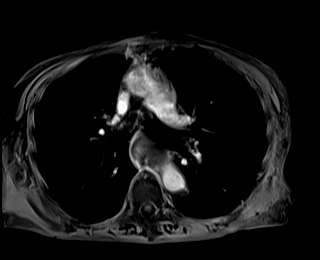

[Series 33: T1 dynamic · axial · 3.0mm · 0.94mm/px · z∈[-53,+184]mm · 3 of 80 slices shown (10 of 10)]
[im 1/80]
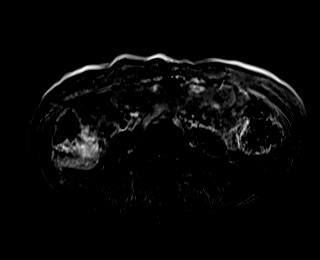
[im 40/80]
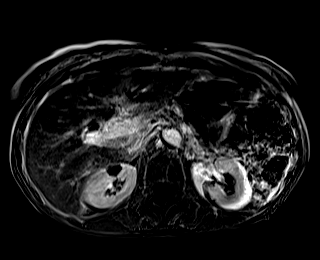
[im 80/80]
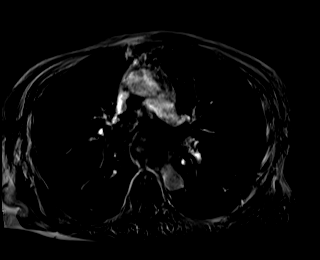

[46 of 48 positions shown; findings below may reference images not displayed]

FINDINGS: Study is limited due to motion.

Lower chest: Dependent mild irregular signal abnormalities in the
lower lungs, left-greater-than-right.

Hepatobiliary: Liver is normal in size and contour with no
suspicious mass identified. Gallbladder is surgically absent. Mild
intrahepatic and extrahepatic biliary ductal dilatation. Distal
common bile duct measures 10 mm in diameter. No choledocholithiasis
or obstructing mass identified.

Pancreas: Short segment dilatation of the pancreatic duct at the
pancreatic head near the ampulla measuring up to 7 mm in diameter.
The duct throughout the body and tail is normal caliber. No
pancreatic mass visualized.

Spleen:  Within normal limits in size and appearance.

Adrenals/Urinary Tract: No adrenal mass identified. A few renal
cortical cysts identified bilaterally measuring up to 1.4 cm on the
right. No enhancing renal mass or hydronephrosis identified
bilaterally.

Stomach/Bowel: Visualized distal esophagus is fluid-filled and
moderately distended. No evidence of bowel obstruction.

Vascular/Lymphatic: Limited evaluation. No bulky lymphadenopathy
appreciated. No abdominal aortic aneurysm.

Other:  No frank ascites visualized.

Musculoskeletal: No suspicious bony lesions identified.
IMPRESSION: 1. No pancreatic mass visualized. Segment of dilated pancreatic duct
at the head of the pancreas measuring up to 7 mm in diameter with
normal caliber of the duct throughout the body and tail.
2. Mild biliary ductal dilatation which may be compensatory from
cholecystectomy, or possibly secondary to ampullary stenosis,
correlate with bilirubin.
3. Distended and fluid-filled distal esophagus. Correlate for
possible reflux.
4. Atelectasis or infiltrates in the lung bases
left-greater-than-right.
5. Renal cysts.

## 2022-10-26 ENCOUNTER — Inpatient Hospital Stay: Payer: 59 | Attending: Hematology

## 2022-10-26 ENCOUNTER — Ambulatory Visit (INDEPENDENT_AMBULATORY_CARE_PROVIDER_SITE_OTHER): Payer: 59 | Admitting: Podiatry

## 2022-10-26 ENCOUNTER — Ambulatory Visit (INDEPENDENT_AMBULATORY_CARE_PROVIDER_SITE_OTHER): Payer: 59

## 2022-10-26 DIAGNOSIS — E0843 Diabetes mellitus due to underlying condition with diabetic autonomic (poly)neuropathy: Secondary | ICD-10-CM

## 2022-10-26 DIAGNOSIS — M2041 Other hammer toe(s) (acquired), right foot: Secondary | ICD-10-CM | POA: Diagnosis not present

## 2022-10-26 DIAGNOSIS — L97512 Non-pressure chronic ulcer of other part of right foot with fat layer exposed: Secondary | ICD-10-CM

## 2022-10-26 MED ORDER — SILVER SULFADIAZINE 1 % EX CREA: 1.0000 | TOPICAL_CREAM | Freq: Every day | CUTANEOUS | 0 refills | Status: AC
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
## 2022-11-03 ENCOUNTER — Encounter: Payer: Self-pay | Admitting: Family Medicine

## 2022-11-03 ENCOUNTER — Ambulatory Visit (INDEPENDENT_AMBULATORY_CARE_PROVIDER_SITE_OTHER): Payer: 59 | Admitting: Family Medicine

## 2022-11-03 VITALS — BP 152/82 | HR 130 | Temp 98.1°F | Resp 16 | Ht 62.0 in | Wt 75.0 lb

## 2022-11-03 DIAGNOSIS — R109 Unspecified abdominal pain: Secondary | ICD-10-CM

## 2022-11-03 DIAGNOSIS — R634 Abnormal weight loss: Secondary | ICD-10-CM

## 2022-11-03 DIAGNOSIS — G894 Chronic pain syndrome: Secondary | ICD-10-CM | POA: Diagnosis not present

## 2022-11-03 DIAGNOSIS — I10 Essential (primary) hypertension: Secondary | ICD-10-CM

## 2022-11-03 DIAGNOSIS — Z7689 Persons encountering health services in other specified circumstances: Secondary | ICD-10-CM

## 2022-11-03 DIAGNOSIS — K582 Mixed irritable bowel syndrome: Secondary | ICD-10-CM

## 2022-11-03 NOTE — Progress Notes (Signed)
Daughter- Reva Bores  Patient is here to established care with provider. ~health hx address ~care gaps address
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
## 2022-11-10 ENCOUNTER — Encounter: Payer: Self-pay | Admitting: Family Medicine

## 2022-11-20 ENCOUNTER — Other Ambulatory Visit: Payer: Self-pay

## 2022-11-20 DIAGNOSIS — D518 Other vitamin B12 deficiency anemias: Secondary | ICD-10-CM

## 2022-11-20 DIAGNOSIS — C7A8 Other malignant neuroendocrine tumors: Secondary | ICD-10-CM

## 2022-11-23 ENCOUNTER — Inpatient Hospital Stay: Payer: 59 | Attending: Hematology | Admitting: Hematology

## 2022-11-23 ENCOUNTER — Inpatient Hospital Stay: Payer: 59

## 2022-11-23 ENCOUNTER — Encounter: Payer: Self-pay | Admitting: Hematology

## 2022-11-23 VITALS — BP 144/80 | HR 86 | Temp 99.0°F | Resp 16 | Wt <= 1120 oz

## 2022-11-23 DIAGNOSIS — D519 Vitamin B12 deficiency anemia, unspecified: Secondary | ICD-10-CM

## 2022-11-23 DIAGNOSIS — D649 Anemia, unspecified: Secondary | ICD-10-CM

## 2022-11-23 DIAGNOSIS — C7A8 Other malignant neuroendocrine tumors: Secondary | ICD-10-CM

## 2022-11-23 DIAGNOSIS — D518 Other vitamin B12 deficiency anemias: Secondary | ICD-10-CM

## 2022-11-23 DIAGNOSIS — R630 Anorexia: Secondary | ICD-10-CM | POA: Diagnosis not present

## 2022-11-23 LAB — CMP (CANCER CENTER ONLY)
ALT: 57 U/L — ABNORMAL HIGH (ref 0–44)
AST: 60 U/L — ABNORMAL HIGH (ref 15–41)
Albumin: 3.4 g/dL — ABNORMAL LOW (ref 3.5–5.0)
Alkaline Phosphatase: 140 U/L — ABNORMAL HIGH (ref 38–126)
Anion gap: 7 (ref 5–15)
BUN: 28 mg/dL — ABNORMAL HIGH (ref 8–23)
CO2: 26 mmol/L (ref 22–32)
Calcium: 8.6 mg/dL — ABNORMAL LOW (ref 8.9–10.3)
Chloride: 103 mmol/L (ref 98–111)
Creatinine: 1.16 mg/dL — ABNORMAL HIGH (ref 0.44–1.00)
GFR, Estimated: 47 mL/min — ABNORMAL LOW (ref >60)
Glucose, Bld: 106 mg/dL — ABNORMAL HIGH (ref 70–99)
Potassium: 3.9 mmol/L (ref 3.5–5.1)
Sodium: 136 mmol/L (ref 135–145)
Total Bilirubin: 0.4 mg/dL (ref 0.3–1.2)
Total Protein: 6.7 g/dL (ref 6.5–8.1)

## 2022-11-23 LAB — IRON AND IRON BINDING CAPACITY (CC-WL,HP ONLY)
Iron: 87 ug/dL (ref 28–170)
Saturation Ratios: 51 % — ABNORMAL HIGH (ref 10.4–31.8)
TIBC: 172 ug/dL — ABNORMAL LOW (ref 250–450)
UIBC: 85 ug/dL — ABNORMAL LOW (ref 148–442)

## 2022-11-23 LAB — CBC WITH DIFFERENTIAL (CANCER CENTER ONLY)
Abs Immature Granulocytes: 0.02 10*3/uL (ref 0.00–0.07)
Basophils Absolute: 0 10*3/uL (ref 0.0–0.1)
Basophils Relative: 0 %
Eosinophils Absolute: 0.1 10*3/uL (ref 0.0–0.5)
Eosinophils Relative: 1 %
HCT: 38.6 % (ref 36.0–46.0)
Hemoglobin: 12.8 g/dL (ref 12.0–15.0)
Immature Granulocytes: 0 %
Lymphocytes Relative: 24 %
Lymphs Abs: 1.9 10*3/uL (ref 0.7–4.0)
MCH: 24.5 pg — ABNORMAL LOW (ref 26.0–34.0)
MCHC: 33.2 g/dL (ref 30.0–36.0)
MCV: 73.8 fL — ABNORMAL LOW (ref 80.0–100.0)
Monocytes Absolute: 0.4 10*3/uL (ref 0.1–1.0)
Monocytes Relative: 5 %
Neutro Abs: 5.7 10*3/uL (ref 1.7–7.7)
Neutrophils Relative %: 70 %
Platelet Count: 250 10*3/uL (ref 150–400)
RBC: 5.23 MIL/uL — ABNORMAL HIGH (ref 3.87–5.11)
RDW: 14.4 % (ref 11.5–15.5)
WBC Count: 8.2 10*3/uL (ref 4.0–10.5)
nRBC: 0 % (ref 0.0–0.2)

## 2022-11-23 LAB — FERRITIN: Ferritin: 323 ng/mL — ABNORMAL HIGH (ref 11–307)

## 2022-11-23 MED ORDER — MIRTAZAPINE 7.5 MG PO TABS: 7.5000 mg | ORAL_TABLET | Freq: Every day | ORAL | 0 refills | Status: DC

## 2022-11-23 MED ORDER — CYANOCOBALAMIN 1000 MCG/ML IJ SOLN
1000.0000 ug | Freq: Once | INTRAMUSCULAR | Status: AC
  Administered 2022-11-23: 1000 ug via INTRAMUSCULAR
  Filled 2022-11-23: qty 1
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
## 2022-11-23 NOTE — Assessment & Plan Note (Signed)
Secondary to gastric surgery   -anemia started 03/02/18 with hemoglobin 10.8, no anemia prior to. Her Iron study was WNL but on low end -she started monthly B12 injections at home in 05/2018, increased to every 2 weeks in 08/2018 given low response.  -Despite frequent B12 injection and normal B12 level, she still has moderate microcytic anemia. She has tried oral iron, but could not tolerate due to diarrhea. She received IV Venofer 300mg on 11/22/20.  -she is receiving B12 injections here every 2 weeks. -anemia has resolved recently  

## 2022-11-23 NOTE — Assessment & Plan Note (Signed)
pT2NxM0 -diagnosed in 08/2016, s/p appendectomy; this was felt to be sufficient given her age, comorbidities, and prior abdominal surgeries. She is currently on surveillance. -CU-64 PET on 08/11/21 was negative.  -CT AP on 11/11/21 showed: concerns for pneumonitis; ascending colitis vs nondistention with fluid throughout colon. -she is clinically stable, no clinical concern for recurrence. -She is 6 years out, and her risk of recurrence is lower now  -CT 07/20/2022 showed no evidence of recurrence, dilation of intrahepatic and extrahepatic bile ducts with interval worsening, I recommend GI evaluation

## 2022-11-24 ENCOUNTER — Other Ambulatory Visit: Payer: Self-pay

## 2022-11-24 ENCOUNTER — Telehealth: Payer: Self-pay | Admitting: Hematology

## 2022-11-25 ENCOUNTER — Other Ambulatory Visit: Payer: Self-pay

## 2022-11-26 ENCOUNTER — Telehealth: Payer: Self-pay | Admitting: Neurology

## 2022-11-26 NOTE — Telephone Encounter (Signed)
Pt said, have had 3 falls, doing one of the falls hit head on recliner, did not go the hospital. Also have difficulty walking. Seen PCP, advised not to try to pick up the cat. Can not walk without walker. Would like a call from the nurse for earlier appt than 02/17/23.

## 2022-11-26 NOTE — Telephone Encounter (Signed)
Called patient and she states she fell a month ago three times from trying to put the cat in the house and she would like to know if anything needs to be changed or if she needs a sooner appointment, she hit her head on recliner one time and hurt her should but did not go to ER because she did not feel hurt but then her shoulder starting hurting so she went to PCP who told her not to try to pick up cat. She wants to know if a sooner appointment is advised for her. She did not get dizzy until after each fall.

## 2022-11-26 NOTE — Telephone Encounter (Signed)
I do not think sooner appointment is needed. In fact, Dr. Vickey Huger had recommended she follow with primary care doctor for functional tremor, follow up for new issues. This all seems chronic.

## 2022-11-26 NOTE — Telephone Encounter (Signed)
Called pt could not leave a message, voicemail was full.

## 2022-12-01 ENCOUNTER — Ambulatory Visit: Payer: Self-pay | Admitting: Family Medicine

## 2022-12-07 ENCOUNTER — Inpatient Hospital Stay: Payer: 59 | Attending: Hematology

## 2022-12-11 ENCOUNTER — Telehealth: Payer: Self-pay | Admitting: *Deleted

## 2022-12-11 ENCOUNTER — Encounter: Payer: Self-pay | Admitting: Physical Medicine and Rehabilitation

## 2022-12-11 ENCOUNTER — Encounter: Payer: 59 | Attending: Physical Medicine and Rehabilitation | Admitting: Physical Medicine and Rehabilitation

## 2022-12-11 VITALS — BP 129/88 | HR 128 | Ht 62.0 in | Wt 70.8 lb

## 2022-12-11 DIAGNOSIS — Z5181 Encounter for therapeutic drug level monitoring: Secondary | ICD-10-CM | POA: Diagnosis present

## 2022-12-11 DIAGNOSIS — M545 Low back pain, unspecified: Secondary | ICD-10-CM

## 2022-12-11 DIAGNOSIS — R636 Underweight: Secondary | ICD-10-CM | POA: Insufficient documentation

## 2022-12-11 DIAGNOSIS — Z79891 Long term (current) use of opiate analgesic: Secondary | ICD-10-CM | POA: Diagnosis present

## 2022-12-11 DIAGNOSIS — Z681 Body mass index (BMI) 19 or less, adult: Secondary | ICD-10-CM | POA: Diagnosis not present

## 2022-12-11 DIAGNOSIS — I499 Cardiac arrhythmia, unspecified: Secondary | ICD-10-CM | POA: Diagnosis not present

## 2022-12-11 DIAGNOSIS — R1310 Dysphagia, unspecified: Secondary | ICD-10-CM | POA: Diagnosis not present

## 2022-12-11 DIAGNOSIS — M5459 Other low back pain: Secondary | ICD-10-CM | POA: Diagnosis not present

## 2022-12-11 DIAGNOSIS — K5903 Drug induced constipation: Secondary | ICD-10-CM | POA: Insufficient documentation

## 2022-12-11 DIAGNOSIS — R630 Anorexia: Secondary | ICD-10-CM | POA: Diagnosis not present

## 2022-12-11 DIAGNOSIS — W19XXXS Unspecified fall, sequela: Secondary | ICD-10-CM

## 2022-12-11 DIAGNOSIS — R109 Unspecified abdominal pain: Secondary | ICD-10-CM | POA: Insufficient documentation

## 2022-12-11 DIAGNOSIS — R296 Repeated falls: Secondary | ICD-10-CM | POA: Insufficient documentation

## 2022-12-11 DIAGNOSIS — T402X5A Adverse effect of other opioids, initial encounter: Secondary | ICD-10-CM | POA: Diagnosis not present

## 2022-12-11 DIAGNOSIS — G894 Chronic pain syndrome: Secondary | ICD-10-CM

## 2022-12-11 DIAGNOSIS — K5900 Constipation, unspecified: Secondary | ICD-10-CM

## 2022-12-11 MED ORDER — OXYCODONE-ACETAMINOPHEN 7.5-325 MG PO TABS: 1.0000 | ORAL_TABLET | Freq: Three times a day (TID) | ORAL | 0 refills | Status: DC | PRN

## 2022-12-11 MED ORDER — DRONABINOL 2.5 MG PO CAPS: 2.5000 mg | ORAL_CAPSULE | Freq: Every day | ORAL | 3 refills | Status: AC

## 2022-12-11 NOTE — Telephone Encounter (Signed)
Dronabinol PA initiated.
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
## 2022-12-14 NOTE — Telephone Encounter (Signed)
DENIED  Emily [name] (Key: [id number]) [phone] droNABinol 2.5MG  capsules Status: PA Response - DeniedCreated: August 9th, 2024 [phone]: August 9th, 2024

## 2022-12-21 ENCOUNTER — Inpatient Hospital Stay: Payer: 59

## 2022-12-24 ENCOUNTER — Encounter: Payer: Self-pay | Admitting: Hematology

## 2022-12-31 ENCOUNTER — Other Ambulatory Visit: Payer: Self-pay | Admitting: Hematology

## 2022-12-31 ENCOUNTER — Encounter: Payer: Self-pay | Admitting: Podiatry

## 2022-12-31 ENCOUNTER — Ambulatory Visit (INDEPENDENT_AMBULATORY_CARE_PROVIDER_SITE_OTHER): Payer: 59 | Admitting: Podiatry

## 2022-12-31 DIAGNOSIS — M79674 Pain in right toe(s): Secondary | ICD-10-CM | POA: Diagnosis not present

## 2022-12-31 DIAGNOSIS — B351 Tinea unguium: Secondary | ICD-10-CM

## 2022-12-31 DIAGNOSIS — E0843 Diabetes mellitus due to underlying condition with diabetic autonomic (poly)neuropathy: Secondary | ICD-10-CM

## 2022-12-31 DIAGNOSIS — M79675 Pain in left toe(s): Secondary | ICD-10-CM

## 2022-12-31 NOTE — Progress Notes (Signed)
This patient returns to my office for at risk foot care.  This patient requires this care by a professional since this patient will be at risk due to having  Diabetes.  This patient is unable to cut nails herself since the patient cannot reach her nails.These nails are painful walking and wearing shoes.  This patient presents for at risk foot care today.  This patient requests that I do not treat her nail on big toe right and second toe right.  General Appearance  Alert, conversant and in no acute stress.  Vascular  Dorsalis pedis and posterior tibial  pulses are  weakly palpable  bilaterally.  Capillary return is within normal limits  bilaterally. Temperature is within normal limits  bilaterally.  Neurologic  Senn-Weinstein monofilament wire test absent   bilaterally. Muscle power within normal limits bilaterally.  Nails Thick disfigured discolored nails with subungual debris  from hallux to fifth toes bilaterally. No evidence of bacterial infection or drainage bilaterally.  Orthopedic  No limitations of motion  feet .  No crepitus or effusions noted.  DJD IPJ right foot.  Hammer toe second toe right foot.  Skin  normotropic skin with no porokeratosis noted bilaterally.  No signs of infections or ulcers noted.  Peeling noted on feet  B/L.  Onychomycosis  Pain in right toes  Pain in left toes  Dermatitis  Consent was obtained for treatment procedures.   Mechanical debridement of nails 1-5  bilaterally performed with a nail nipper.  Filed with dremel without incident.  Prescribe Lac-Hydrin for this patient.   Return office visit   3 months                   Told patient to return for periodic foot care and evaluation due to potential at risk complications.   Helane Gunther DPM

## 2023-01-01 IMAGING — CT NM PET SKULL BASE TO THIGH
1 of 7 series · 1 of 25 positions shown · non-contrast
Comparison: Multiple previous CT scans.

CLINICAL DATA: Subsequent treatment strategy for neuroendocrine
tumor of the appendix.

EXAM:
NUCLEAR MEDICINE PET SKULL BASE TO THIGH
TECHNIQUE: 3.66 mCi CP 64 DOTATATE was injected intravenously. Full-ring PET
imaging was performed from the skull base to thigh after the
radiotracer. CT data was obtained and used for attenuation
correction and anatomic localization.

[Series 4: ct sk_thigh 5.0 br38 · axial · 5.0mm · 0.80mm/px · 1 of 214 slices shown]
[im 160/214  brain]
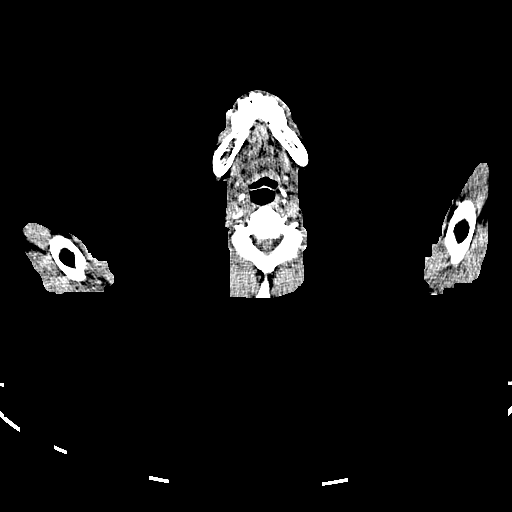

[1 of 25 positions shown; findings below may reference images not displayed]

FINDINGS: NECK

No radiotracer activity in neck lymph nodes.

Incidental CT findings: None

CHEST

No radiotracer accumulation within mediastinal or hilar lymph nodes.
No suspicious pulmonary nodules on the CT scan.

Incidental CT finding:Stable patchy ground-glass opacities. Stable 8
mm right upper lobe pulmonary nodule. 3 mm right lower lobe
pulmonary nodule image 35/7 is stable since prior CT scan from
07/15/2020. No new pulmonary lesions. Bibasilar scarring changes.
Stable aortic and coronary artery calcifications.

ABDOMEN/PELVIS

No areas of abnormal uptake are identified. No omental or peritoneal
surface lesions.

Physiologic activity noted in the liver, spleen, adrenal glands and
kidneys.

Incidental CT findings:Stable advanced vascular calcifications.
Stable surgical changes related to prior bowel surgery and
cholecystectomy. Moderate stool throughout the colon may suggest
constipation.

SKELETON

No focal activity to suggest skeletal metastasis.

Incidental CT findings:None
IMPRESSION: Negative PET scan for metabolically active neuroendocrine tumor.

## 2023-01-05 ENCOUNTER — Inpatient Hospital Stay: Payer: 59 | Attending: Hematology

## 2023-01-05 DIAGNOSIS — D519 Vitamin B12 deficiency anemia, unspecified: Secondary | ICD-10-CM | POA: Insufficient documentation

## 2023-01-05 IMAGING — MG MM DIGITAL SCREENING BILAT W/ TOMO AND CAD
6 of 10 series · 6 of 30 positions shown · non-contrast
Comparison: Previous exam(s).

CLINICAL DATA: Screening.

EXAM:
DIGITAL SCREENING BILATERAL MAMMOGRAM WITH TOMOSYNTHESIS AND CAD
TECHNIQUE: Bilateral screening digital craniocaudal and mediolateral oblique
mammograms were obtained. Bilateral screening digital breast
tomosynthesis was performed. The images were evaluated with
computer-aided detection.

[L CC synth-2D (1 of 2)]
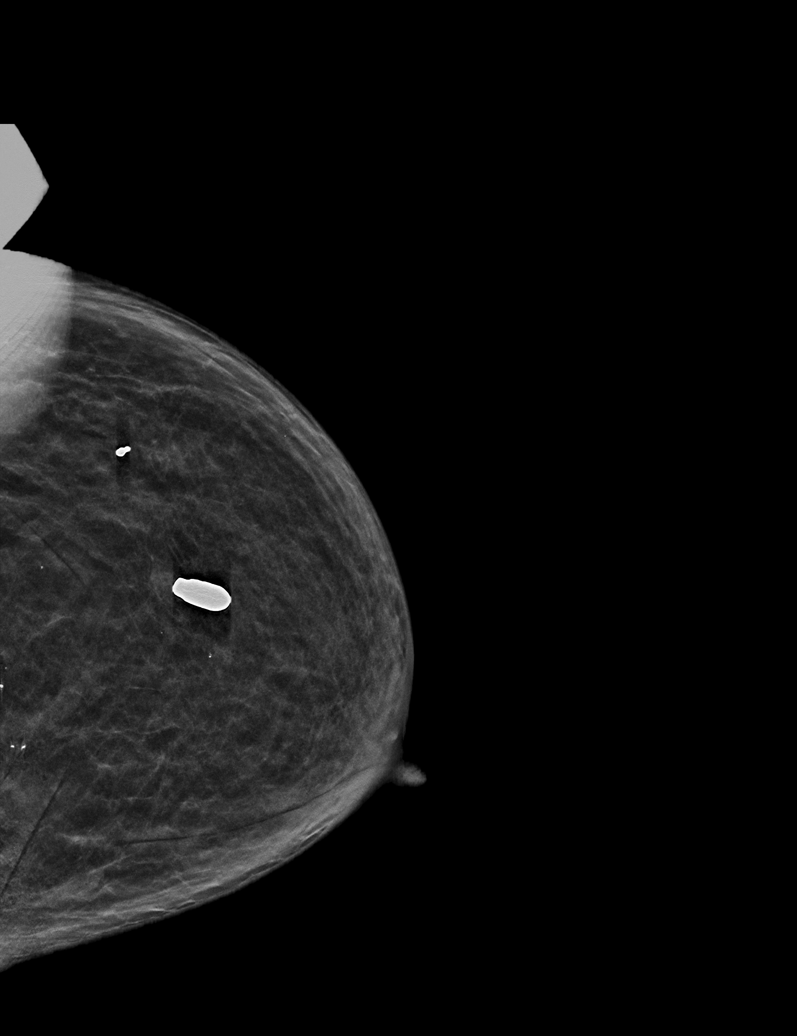

[R MLO synth-2D]
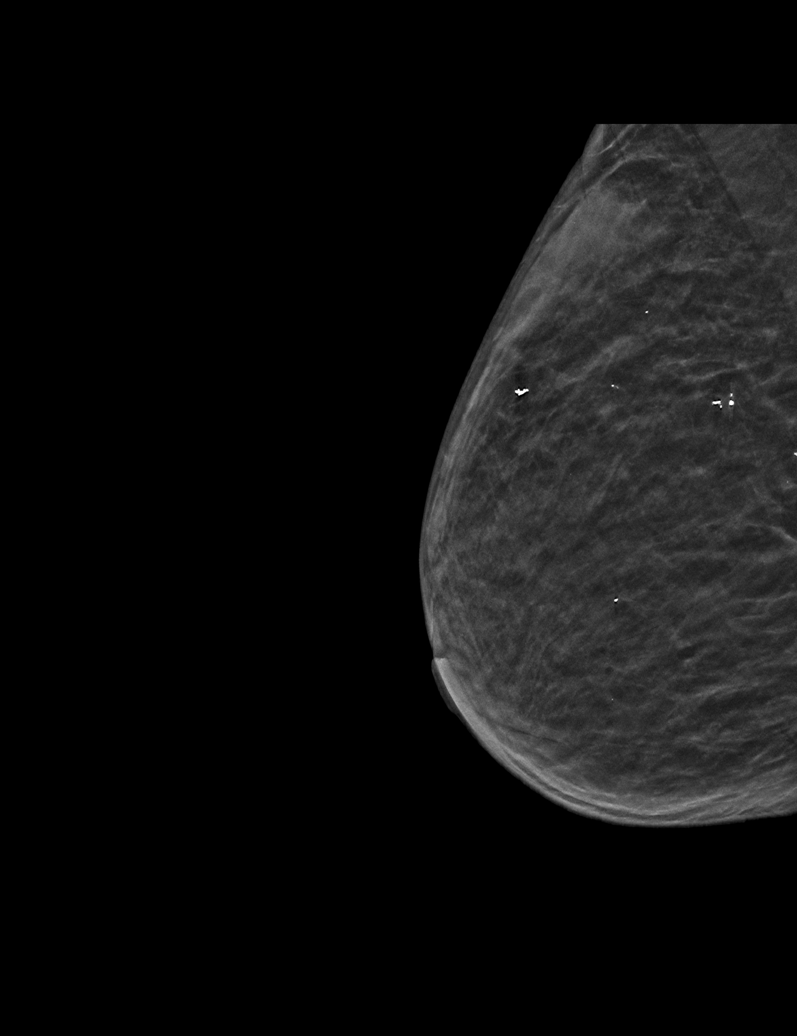

[L CC synth-2D (2 of 2)]
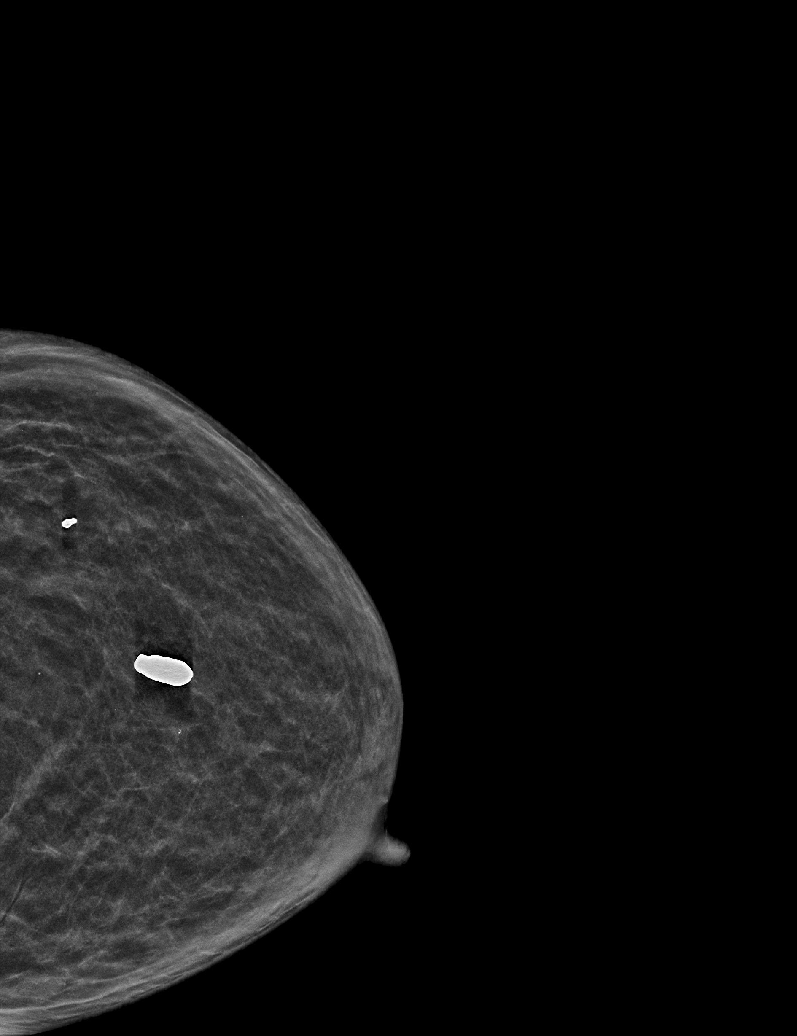

[L MLO synth-2D]
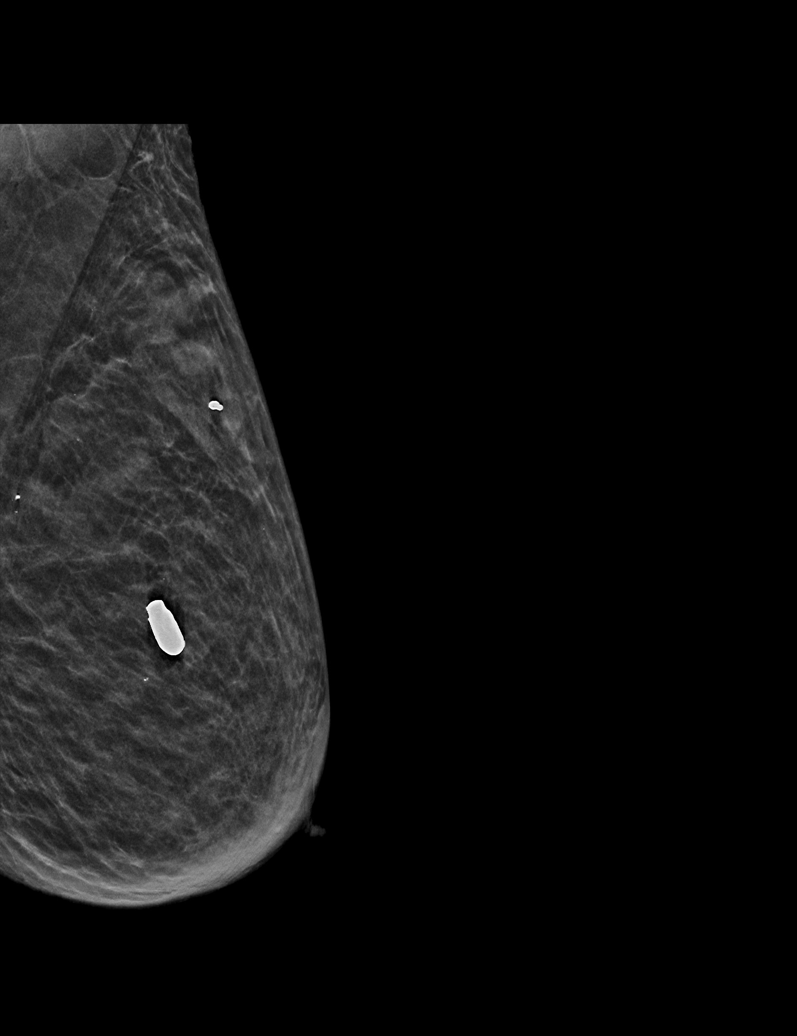

[R CC synth-2D]
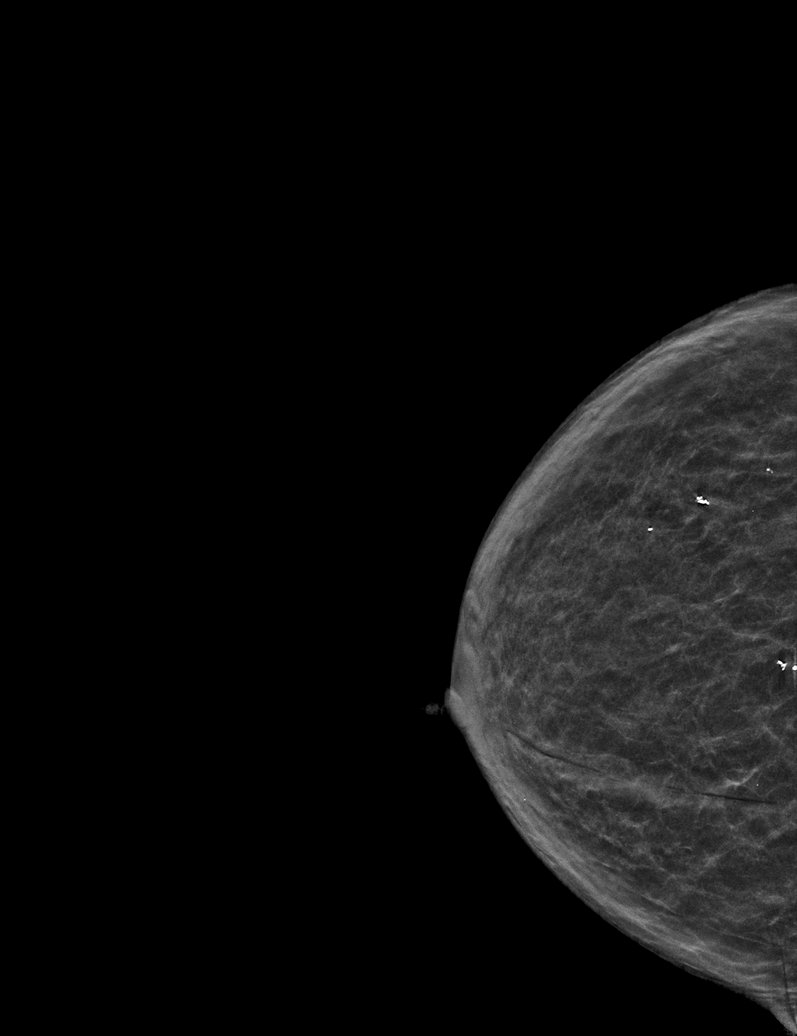

[L CC tomo · tomo slice 19/37.0]
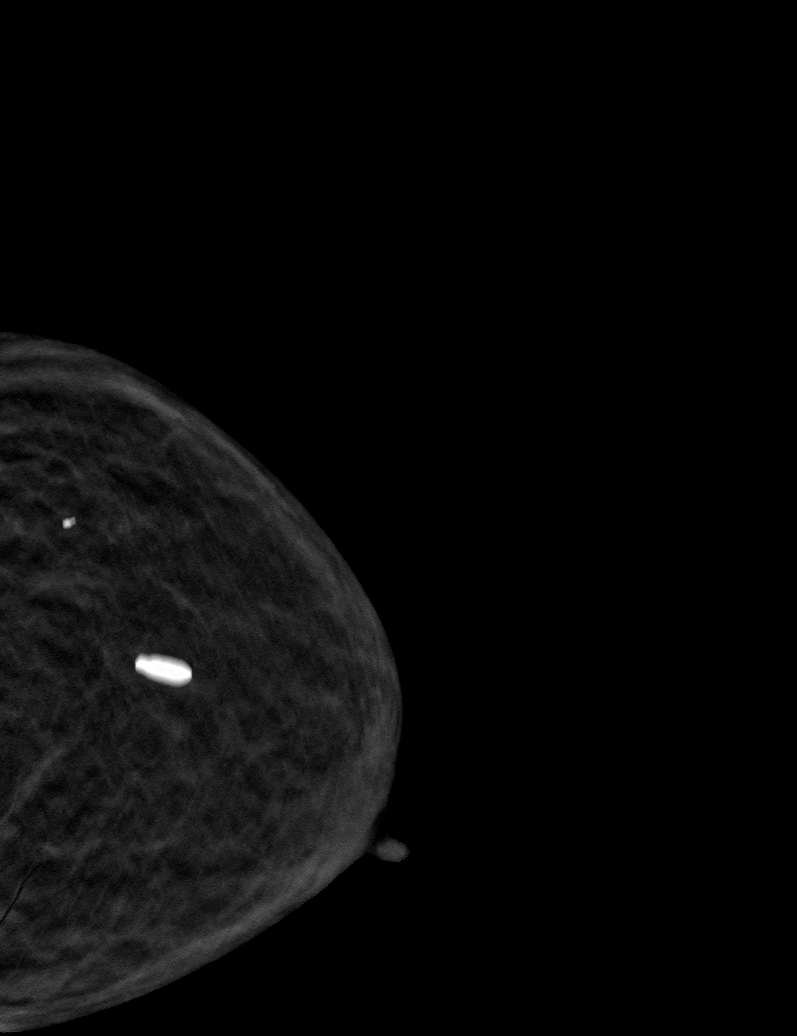

[6 of 30 positions shown; findings below may reference images not displayed]

ACR Breast Density Category d: The breast tissue is extremely dense,
which lowers the sensitivity of mammography
FINDINGS: There are no findings suspicious for malignancy.
IMPRESSION: No mammographic evidence of malignancy. A result letter of this
screening mammogram will be mailed directly to the patient.

RECOMMENDATION:
Screening mammogram in one year. (Code:TA-V-WV9)

BI-RADS CATEGORY  1: Negative.

## 2023-01-06 ENCOUNTER — Encounter: Payer: 59 | Attending: Registered Nurse | Admitting: Registered Nurse

## 2023-01-06 ENCOUNTER — Encounter: Payer: Self-pay | Admitting: Registered Nurse

## 2023-01-06 VITALS — BP 110/75 | HR 97 | Ht 62.0 in | Wt 71.0 lb

## 2023-01-06 DIAGNOSIS — G894 Chronic pain syndrome: Secondary | ICD-10-CM | POA: Diagnosis not present

## 2023-01-06 DIAGNOSIS — Z5181 Encounter for therapeutic drug level monitoring: Secondary | ICD-10-CM

## 2023-01-06 DIAGNOSIS — G8929 Other chronic pain: Secondary | ICD-10-CM | POA: Diagnosis present

## 2023-01-06 DIAGNOSIS — Z79891 Long term (current) use of opiate analgesic: Secondary | ICD-10-CM | POA: Diagnosis not present

## 2023-01-06 DIAGNOSIS — M545 Low back pain, unspecified: Secondary | ICD-10-CM | POA: Insufficient documentation

## 2023-01-06 DIAGNOSIS — R1084 Generalized abdominal pain: Secondary | ICD-10-CM | POA: Diagnosis present

## 2023-01-06 MED ORDER — LIDOCAINE 5 % EX PTCH: MEDICATED_PATCH | CUTANEOUS | 0 refills | Status: AC
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
## 2023-01-06 NOTE — Patient Instructions (Addendum)
Call office when you have 10 tablets of your Oxycodone left:   [phone]   No prescription was given today, since you have 55 tablets and Ms. Leatherman stated your daughter is giving you a half tablet 4 times a day as needed for pain.

## 2023-01-12 ENCOUNTER — Emergency Department (HOSPITAL_COMMUNITY): Payer: 59

## 2023-01-12 ENCOUNTER — Inpatient Hospital Stay (HOSPITAL_COMMUNITY)
Admission: EM | Admit: 2023-01-12 | Discharge: 2023-01-17 | DRG: 392 | Disposition: A | Discharge status: Home Health | Payer: 59 | Attending: Internal Medicine | Admitting: Internal Medicine

## 2023-01-12 ENCOUNTER — Other Ambulatory Visit: Payer: Self-pay

## 2023-01-12 ENCOUNTER — Ambulatory Visit: Payer: Self-pay | Admitting: *Deleted

## 2023-01-12 DIAGNOSIS — I700 Atherosclerosis of aorta: Secondary | ICD-10-CM | POA: Diagnosis present

## 2023-01-12 DIAGNOSIS — K219 Gastro-esophageal reflux disease without esophagitis: Secondary | ICD-10-CM | POA: Diagnosis present

## 2023-01-12 DIAGNOSIS — K529 Noninfective gastroenteritis and colitis, unspecified: Secondary | ICD-10-CM | POA: Diagnosis not present

## 2023-01-12 DIAGNOSIS — Z903 Acquired absence of stomach [part of]: Secondary | ICD-10-CM

## 2023-01-12 DIAGNOSIS — F0390 Unspecified dementia without behavioral disturbance: Secondary | ICD-10-CM | POA: Diagnosis present

## 2023-01-12 DIAGNOSIS — M79674 Pain in right toe(s): Secondary | ICD-10-CM | POA: Diagnosis present

## 2023-01-12 DIAGNOSIS — F17210 Nicotine dependence, cigarettes, uncomplicated: Secondary | ICD-10-CM | POA: Diagnosis present

## 2023-01-12 DIAGNOSIS — I10 Essential (primary) hypertension: Secondary | ICD-10-CM | POA: Diagnosis present

## 2023-01-12 DIAGNOSIS — Z79899 Other long term (current) drug therapy: Secondary | ICD-10-CM

## 2023-01-12 DIAGNOSIS — E119 Type 2 diabetes mellitus without complications: Secondary | ICD-10-CM

## 2023-01-12 DIAGNOSIS — E1143 Type 2 diabetes mellitus with diabetic autonomic (poly)neuropathy: Secondary | ICD-10-CM | POA: Diagnosis present

## 2023-01-12 DIAGNOSIS — Z8673 Personal history of transient ischemic attack (TIA), and cerebral infarction without residual deficits: Secondary | ICD-10-CM

## 2023-01-12 DIAGNOSIS — E8809 Other disorders of plasma-protein metabolism, not elsewhere classified: Secondary | ICD-10-CM | POA: Diagnosis present

## 2023-01-12 DIAGNOSIS — R109 Unspecified abdominal pain: Secondary | ICD-10-CM | POA: Diagnosis not present

## 2023-01-12 DIAGNOSIS — Z808 Family history of malignant neoplasm of other organs or systems: Secondary | ICD-10-CM

## 2023-01-12 DIAGNOSIS — G8929 Other chronic pain: Secondary | ICD-10-CM | POA: Diagnosis present

## 2023-01-12 DIAGNOSIS — K3184 Gastroparesis: Secondary | ICD-10-CM | POA: Diagnosis present

## 2023-01-12 DIAGNOSIS — L89152 Pressure ulcer of sacral region, stage 2: Secondary | ICD-10-CM | POA: Diagnosis present

## 2023-01-12 DIAGNOSIS — Z9049 Acquired absence of other specified parts of digestive tract: Secondary | ICD-10-CM

## 2023-01-12 DIAGNOSIS — E11649 Type 2 diabetes mellitus with hypoglycemia without coma: Secondary | ICD-10-CM | POA: Diagnosis not present

## 2023-01-12 DIAGNOSIS — Y929 Unspecified place or not applicable: Secondary | ICD-10-CM

## 2023-01-12 DIAGNOSIS — R000 Tachycardia, unspecified: Secondary | ICD-10-CM | POA: Diagnosis present

## 2023-01-12 DIAGNOSIS — Z9884 Bariatric surgery status: Secondary | ICD-10-CM

## 2023-01-12 DIAGNOSIS — W2201XA Walked into wall, initial encounter: Secondary | ICD-10-CM | POA: Diagnosis present

## 2023-01-12 DIAGNOSIS — Z8249 Family history of ischemic heart disease and other diseases of the circulatory system: Secondary | ICD-10-CM

## 2023-01-12 DIAGNOSIS — G4733 Obstructive sleep apnea (adult) (pediatric): Secondary | ICD-10-CM | POA: Diagnosis present

## 2023-01-12 DIAGNOSIS — L8990 Pressure ulcer of unspecified site, unspecified stage: Secondary | ICD-10-CM | POA: Insufficient documentation

## 2023-01-12 DIAGNOSIS — Z86718 Personal history of other venous thrombosis and embolism: Secondary | ICD-10-CM

## 2023-01-12 DIAGNOSIS — K56609 Unspecified intestinal obstruction, unspecified as to partial versus complete obstruction: Principal | ICD-10-CM | POA: Diagnosis present

## 2023-01-12 DIAGNOSIS — D509 Iron deficiency anemia, unspecified: Secondary | ICD-10-CM | POA: Diagnosis present

## 2023-01-12 DIAGNOSIS — K56600 Partial intestinal obstruction, unspecified as to cause: Secondary | ICD-10-CM | POA: Diagnosis present

## 2023-01-12 DIAGNOSIS — N390 Urinary tract infection, site not specified: Secondary | ICD-10-CM | POA: Diagnosis present

## 2023-01-12 DIAGNOSIS — E876 Hypokalemia: Secondary | ICD-10-CM | POA: Diagnosis not present

## 2023-01-12 LAB — CBC WITH DIFFERENTIAL/PLATELET
Abs Immature Granulocytes: 0.03 10*3/uL (ref 0.00–0.07)
Basophils Absolute: 0 10*3/uL (ref 0.0–0.1)
Basophils Relative: 0 %
Eosinophils Absolute: 0 10*3/uL (ref 0.0–0.5)
Eosinophils Relative: 0 %
HCT: 37.4 % (ref 36.0–46.0)
Hemoglobin: 12 g/dL (ref 12.0–15.0)
Immature Granulocytes: 0 %
Lymphocytes Relative: 22 %
Lymphs Abs: 2.1 10*3/uL (ref 0.7–4.0)
MCH: 23.5 pg — ABNORMAL LOW (ref 26.0–34.0)
MCHC: 32.1 g/dL (ref 30.0–36.0)
MCV: 73.2 fL — ABNORMAL LOW (ref 80.0–100.0)
Monocytes Absolute: 0.6 10*3/uL (ref 0.1–1.0)
Monocytes Relative: 6 %
Neutro Abs: 6.9 10*3/uL (ref 1.7–7.7)
Neutrophils Relative %: 72 %
Platelets: 206 10*3/uL (ref 150–400)
RBC: 5.11 MIL/uL (ref 3.87–5.11)
RDW: 16.4 % — ABNORMAL HIGH (ref 11.5–15.5)
WBC: 9.7 10*3/uL (ref 4.0–10.5)
nRBC: 0 % (ref 0.0–0.2)

## 2023-01-12 LAB — COMPREHENSIVE METABOLIC PANEL
ALT: 15 U/L (ref 0–44)
AST: 25 U/L (ref 15–41)
Albumin: 2 g/dL — ABNORMAL LOW (ref 3.5–5.0)
Alkaline Phosphatase: 82 U/L (ref 38–126)
Anion gap: 10 (ref 5–15)
BUN: 20 mg/dL (ref 8–23)
CO2: 23 mmol/L (ref 22–32)
Calcium: 8 mg/dL — ABNORMAL LOW (ref 8.9–10.3)
Chloride: 107 mmol/L (ref 98–111)
Creatinine, Ser: 1.03 mg/dL — ABNORMAL HIGH (ref 0.44–1.00)
GFR, Estimated: 54 mL/min — ABNORMAL LOW (ref >60)
Glucose, Bld: 94 mg/dL (ref 70–99)
Potassium: 4 mmol/L (ref 3.5–5.1)
Sodium: 140 mmol/L (ref 135–145)
Total Bilirubin: 0.8 mg/dL (ref 0.3–1.2)
Total Protein: 5.2 g/dL — ABNORMAL LOW (ref 6.5–8.1)

## 2023-01-12 LAB — LIPASE, BLOOD: Lipase: 21 U/L (ref 11–51)

## 2023-01-12 MED ORDER — MORPHINE SULFATE (PF) 2 MG/ML IV SOLN
2.0000 mg | Freq: Once | INTRAVENOUS | Status: AC
  Administered 2023-01-13: 2 mg via INTRAVENOUS
  Filled 2023-01-12: qty 1

## 2023-01-12 MED ORDER — IOHEXOL 350 MG/ML SOLN
75.0000 mL | Freq: Once | INTRAVENOUS | Status: AC | PRN
  Administered 2023-01-12: 60 mL via INTRAVENOUS

## 2023-01-12 MED ORDER — ONDANSETRON HCL 4 MG/2ML IJ SOLN
4.0000 mg | Freq: Once | INTRAMUSCULAR | Status: AC
  Administered 2023-01-13: 4 mg via INTRAVENOUS
  Filled 2023-01-12: qty 2

## 2023-01-12 MED ORDER — SODIUM CHLORIDE 0.9 % IV BOLUS
500.0000 mL | Freq: Once | INTRAVENOUS | Status: AC
  Administered 2023-01-13: 500 mL via INTRAVENOUS

## 2023-01-12 NOTE — Telephone Encounter (Signed)
  Chief Complaint: Abdominal Pain Symptoms: 10/10 lower abdominal pain,radiates to back Frequency: yesterday Pertinent Negatives: Patient denies vomiting, diarrhea, dysuria Disposition: [x] ED /[] Urgent Care (no appt availability in office) / [] Appointment(In office/virtual)/ []  [facility]/ [] Home Care/ [] Refused Recommended Disposition /[] [facility]/ []  Follow-up with PCP Additional Notes: pt can be heard on call moaning with pain. Advised ED, daughter states will follow disposition. Reason for Disposition  [1] SEVERE pain (e.g., excruciating) AND [2] present > 1 hour  Answer Assessment - Initial Assessment Questions 1. LOCATION: "Where does it hurt?"      Lower abdomen. 2. RADIATION: "Does the pain shoot anywhere else?" (e.g., chest, back)     Yes 3. ONSET: "When did the pain begin?" (e.g., minutes, hours or days ago)      yesterday 4. SUDDEN: "Gradual or sudden onset?"     NA 5. PATTERN "Does the pain come and go, or is it constant?"    - If it comes and goes: "How long does it last?" "Do you have pain now?"     (Note: Comes and goes means the pain is intermittent. It goes away completely between bouts.)    - If constant: "Is it getting better, staying the same, or getting worse?"      (Note: Constant means the pain never goes away completely; most serious pain is constant and gets worse.)      Constant 6. SEVERITY: "How bad is the pain?"  (e.g., Scale 1-10; mild, moderate, or severe)    - MILD (1-3): Doesn't interfere with normal activities, abdomen soft and not tender to touch.     - MODERATE (4-7): Interferes with normal activities or awakens from sleep, abdomen tender to touch.     - SEVERE (8-10): Excruciating pain, doubled over, unable to do any normal activities.       10/10 7. RECURRENT SYMPTOM: "Have you ever had this type of stomach pain before?" If Yes, ask: "When was the last time?" and "What happened that time?"      No 8. CAUSE: "What do you  think is causing the stomach pain?"      9. RELIEVING/AGGRAVATING FACTORS: "What makes it better or worse?" (e.g., antacids, bending or twisting motion, bowel movement)      10. OTHER SYMPTOMS: "Do you have any other symptoms?" (e.g., back pain, diarrhea, fever, urination pain, vomiting)       No  Protocols used: Abdominal Pain - Female-A-AH

## 2023-01-12 NOTE — ED Triage Notes (Signed)
Pt BIBEMS from home with cc of worsening chronic abd pain. Pt daughter states over the last 2 days she has noticed pt complaining more of lower abd pain. Denies N/V. Pt is at baseline per daughter. Pt placed on monitor.

## 2023-01-12 NOTE — ED Provider Notes (Signed)
[facility] AT [facility] Provider Note   CSN: [id number] Arrival date & time: 01/12/23  1817     History  Chief Complaint  Patient presents with   Abdominal Pain   HPI Emily [name] is a 82 y.o. female with gastroparesis, history of gastric outlet obstruction status post total gastrectomy, appendectomy, cholecystectomy, chronic abdominal pain presenting for abdominal pain.  Her daughter states she has abdominal pain all the time but the pain has been a bit worse in the last 2 3 days.  It is all across the mid abdomen and is nonradiating.  Patient denies nausea vomiting diarrhea.  Denies urinary changes.  Denies chest pain shortness of breath.  Denies fever and chills.  Did mention a poor appetite over the last week as well.   Abdominal Pain      Home Medications Prior to Admission medications   Medication Sig Start Date End Date Taking? Authorizing Provider  ammonium lactate (LAC-HYDRIN) 12 % cream Apply 1 Application topically as needed for dry skin. Patient taking differently: Apply 1 Application topically daily as needed for dry skin. 09/30/22  Yes Helane Gunther, DPM  Cyanocobalamin (B-12 COMPLIANCE INJECTION IJ) Inject 1 Dose as directed every 14 (fourteen) days.   Yes [provider]  dicyclomine (BENTYL) 10 MG capsule TAKE 1 CAPSULE BY MOUTH EVERY 8 HOURS AS NEEDED Patient taking differently: Take 10 mg by mouth every 8 (eight) hours as needed for spasms. 05/06/22  Yes Nandigam, Eleonore Chiquito, MD  dronabinol (MARINOL) 2.5 MG capsule Take 1 capsule (2.5 mg total) by mouth daily before lunch. 12/11/22  Yes Raulkar, Drema Pry, MD  Ensure (ENSURE) Take 237 mLs by mouth 3 (three) times daily between meals. 02/26/22  Yes [provider]  esomeprazole (NEXIUM) 40 MG capsule Take 40 mg by mouth daily. 11/11/22 11/11/23 Yes [provider]  ferrous sulfate 325 (65 FE) MG EC tablet Take 1 tablet (325 mg total) by mouth daily with  breakfast. 09/06/22 01/12/24 Yes Marinda Elk, MD  lidocaine (LIDODERM) 5 % USE 1 PATCH EXTERNALLY ONE DAILY REMOVE AND DISCARD WITHIN 12 HOURS. 01/06/23  Yes Jones Bales, NP  losartan (COZAAR) 25 MG tablet Take 25 mg by mouth daily. 05/22/20  Yes [provider]  Melatonin 3 MG CAPS Take 1 capsule (3 mg total) by mouth at bedtime. Patient taking differently: Take 1 each by mouth at bedtime as needed (for sleep). 06/23/21  Yes Raulkar, Drema Pry, MD  mirtazapine (REMERON) 7.5 MG tablet TAKE 1 TABLET BY MOUTH EVERY NIGHT AT BEDTIME FOR 14 DAYS IF NOT APPETITE IMPROVEMENT INCREASE TO 2 TABLETS AT NIGHT 12/31/22  Yes Malachy Mood, MD  Multiple Vitamin (MULTIVITAMIN WITH MINERALS) TABS tablet Take 1 tablet by mouth daily.   Yes [provider]  naproxen sodium (ALEVE) 220 MG tablet Take 220 mg by mouth 2 (two) times daily as needed (Pain).   Yes [provider]  ondansetron (ZOFRAN-ODT) 4 MG disintegrating tablet Take 4 mg by mouth 2 (two) times daily as needed. 10/15/22  Yes [provider]  oxyCODONE-acetaminophen (PERCOCET) 7.5-325 MG tablet Take 1 tablet by mouth every 8 (eight) hours as needed for moderate pain. 12/11/22 12/11/23 Yes Raulkar, Drema Pry, MD  silver sulfADIAZINE (SILVADENE) 1 % cream Apply 1 Application topically daily. 10/26/22  Yes Felecia Shelling, DPM  Thiamine HCl (B-1 PO) Take 1 tablet by mouth daily.   Yes [provider]      Allergies  Patient has no known allergies.    Review of Systems   Review of Systems  Gastrointestinal:  Positive for abdominal pain.    Physical Exam   Vitals:   01/12/23 1835 01/12/23 2155  BP:  (!) 152/71  Pulse:  63  Resp:  14  Temp:  97.8 F (36.6 C)  SpO2: 100% 100%    CONSTITUTIONAL:  well-appearing, NAD, cachectic, frail appearing NEURO:  Alert and oriented x 3, CN 3-12 grossly intact EYES:  eyes equal and reactive ENT/NECK:  Supple, no stridor CARDIO:  Regular rate and rhythm, appears  well-perfused  PULM:  No respiratory distress, CTAB GI/GU:  non-distended, soft, tenderness about the mid abdomen, no CVA tenderness MSK/SPINE:  No gross deformities, no edema, moves all extremities  SKIN:  no rash, atraumatic  *Additional and/or pertinent findings included in MDM below  ED Results / Procedures / Treatments   Labs (all labs ordered are listed, but only abnormal results are displayed) Labs Reviewed  CBC WITH DIFFERENTIAL/PLATELET - Abnormal; Notable for the following components:      Result Value   MCV 73.2 (*)    MCH 23.5 (*)    RDW 16.4 (*)    All other components within normal limits  COMPREHENSIVE METABOLIC PANEL - Abnormal; Notable for the following components:   Creatinine, Ser 1.03 (*)    Calcium 8.0 (*)    Total Protein 5.2 (*)    Albumin 2.0 (*)    GFR, Estimated 54 (*)    All other components within normal limits  LIPASE, BLOOD  URINALYSIS, W/ REFLEX TO CULTURE (INFECTION SUSPECTED)    EKG EKG Interpretation Date/Time:  Tuesday January 12 2023 18:39:22 EDT Ventricular Rate:  84 PR Interval:  119 QRS Duration:  136 QT Interval:  414 QTC Calculation: 487 R Axis:   -84  Text Interpretation: Sinus rhythm Paired ventricular premature complexes Aberrant conduction of SV complex(es) Borderline short PR interval RBBB and LAFB Confirmed by Edwin Dada ([id number]) on 01/12/2023 6:42:37 PM  Radiology No results found.  Procedures Procedures    Medications Ordered in ED Medications  sodium chloride 0.9 % bolus 500 mL (has no administration in time range)  morphine (PF) 2 MG/ML injection 2 mg (has no administration in time range)  ondansetron (ZOFRAN) injection 4 mg (has no administration in time range)  iohexol (OMNIPAQUE) 350 MG/ML injection 75 mL (60 mLs Intravenous Contrast Given 01/12/23 2313)    ED Course/ Medical Decision Making/ A&P                                 Medical Decision Making Amount and/or Complexity of Data Reviewed Labs:  ordered. Radiology: ordered.  Risk Prescription drug management.   Initial Impression and Ddx 82 yo well appearing female presenting for abdominal pain. Exam notable for tenderness about the mid abdomen but otherwise reassuring.  DDx includes diverticulitis, colitis, bowel obstruction or perforation, pyelonephritis, UTI, other. Patient PMH that increases complexity of ED encounter: gastroparesis, history of gastric outlet obstruction status post total gastrectomy, appendectomy, cholecystectomy, chronic abdominal pain  Interpretation of Diagnostics - I independent reviewed and interpreted the labs as followed: reduced GRF, hypoalbuminemia   - CT ab/pelvis pending  - I personally reviewed and interpreted sinus rhythm with PVCs  Patient Reassessment and Ultimate Disposition/Management CT abdomen pelvis is pending.  Ordered pain medication, Zofran and 500 mL of normal saline for fluid resuscitation. Signed out patient to [facility]  Allyne Gee, PA. Plan will be to reassess after treatment and follow-up on scan.  Suspect chronic abdominal pain if CT scan is reassuring can be discharged with follow-up with PCP.  Patient management required discussion with the following services or consulting groups:  None  Complexity of Problems Addressed Acute complicated illness or Injury  Additional Data Reviewed and Analyzed Further history obtained from: Further history from spouse/family member, Past medical history and medications listed in the EMR, and Prior ED visit notes  Patient Encounter Risk Assessment Consideration of hospitalization         Final Clinical Impression(s) / ED Diagnoses Final diagnoses:  Abdominal pain, unspecified abdominal location    Rx / DC Orders ED Discharge Orders     None         Gareth Eagle, PA-C 01/12/23 2350    Franne Forts, DO 01/17/23 [phone]

## 2023-01-13 ENCOUNTER — Encounter (HOSPITAL_COMMUNITY): Payer: Self-pay | Admitting: Family Medicine

## 2023-01-13 DIAGNOSIS — L8990 Pressure ulcer of unspecified site, unspecified stage: Secondary | ICD-10-CM | POA: Insufficient documentation

## 2023-01-13 DIAGNOSIS — E8809 Other disorders of plasma-protein metabolism, not elsewhere classified: Secondary | ICD-10-CM | POA: Diagnosis present

## 2023-01-13 DIAGNOSIS — E11649 Type 2 diabetes mellitus with hypoglycemia without coma: Secondary | ICD-10-CM | POA: Diagnosis not present

## 2023-01-13 DIAGNOSIS — I700 Atherosclerosis of aorta: Secondary | ICD-10-CM | POA: Diagnosis present

## 2023-01-13 DIAGNOSIS — K56609 Unspecified intestinal obstruction, unspecified as to partial versus complete obstruction: Principal | ICD-10-CM | POA: Diagnosis present

## 2023-01-13 DIAGNOSIS — E1142 Type 2 diabetes mellitus with diabetic polyneuropathy: Secondary | ICD-10-CM | POA: Diagnosis not present

## 2023-01-13 DIAGNOSIS — M79674 Pain in right toe(s): Secondary | ICD-10-CM | POA: Diagnosis present

## 2023-01-13 DIAGNOSIS — D649 Anemia, unspecified: Secondary | ICD-10-CM | POA: Diagnosis not present

## 2023-01-13 DIAGNOSIS — R109 Unspecified abdominal pain: Secondary | ICD-10-CM | POA: Diagnosis present

## 2023-01-13 DIAGNOSIS — K3184 Gastroparesis: Secondary | ICD-10-CM | POA: Diagnosis present

## 2023-01-13 DIAGNOSIS — K56600 Partial intestinal obstruction, unspecified as to cause: Secondary | ICD-10-CM | POA: Diagnosis present

## 2023-01-13 DIAGNOSIS — L89152 Pressure ulcer of sacral region, stage 2: Secondary | ICD-10-CM | POA: Diagnosis present

## 2023-01-13 DIAGNOSIS — W2201XA Walked into wall, initial encounter: Secondary | ICD-10-CM | POA: Diagnosis present

## 2023-01-13 DIAGNOSIS — Z8673 Personal history of transient ischemic attack (TIA), and cerebral infarction without residual deficits: Secondary | ICD-10-CM | POA: Diagnosis not present

## 2023-01-13 DIAGNOSIS — D509 Iron deficiency anemia, unspecified: Secondary | ICD-10-CM | POA: Diagnosis present

## 2023-01-13 DIAGNOSIS — Y929 Unspecified place or not applicable: Secondary | ICD-10-CM | POA: Diagnosis not present

## 2023-01-13 DIAGNOSIS — R8290 Unspecified abnormal findings in urine: Secondary | ICD-10-CM | POA: Diagnosis not present

## 2023-01-13 DIAGNOSIS — E876 Hypokalemia: Secondary | ICD-10-CM | POA: Diagnosis not present

## 2023-01-13 DIAGNOSIS — K529 Noninfective gastroenteritis and colitis, unspecified: Secondary | ICD-10-CM | POA: Diagnosis present

## 2023-01-13 DIAGNOSIS — Z8249 Family history of ischemic heart disease and other diseases of the circulatory system: Secondary | ICD-10-CM | POA: Diagnosis not present

## 2023-01-13 DIAGNOSIS — Z86718 Personal history of other venous thrombosis and embolism: Secondary | ICD-10-CM | POA: Diagnosis not present

## 2023-01-13 DIAGNOSIS — Z808 Family history of malignant neoplasm of other organs or systems: Secondary | ICD-10-CM | POA: Diagnosis not present

## 2023-01-13 DIAGNOSIS — R000 Tachycardia, unspecified: Secondary | ICD-10-CM | POA: Diagnosis present

## 2023-01-13 DIAGNOSIS — G8929 Other chronic pain: Secondary | ICD-10-CM | POA: Diagnosis present

## 2023-01-13 DIAGNOSIS — Z9049 Acquired absence of other specified parts of digestive tract: Secondary | ICD-10-CM | POA: Diagnosis not present

## 2023-01-13 DIAGNOSIS — Z9884 Bariatric surgery status: Secondary | ICD-10-CM | POA: Diagnosis not present

## 2023-01-13 DIAGNOSIS — F17210 Nicotine dependence, cigarettes, uncomplicated: Secondary | ICD-10-CM | POA: Diagnosis present

## 2023-01-13 DIAGNOSIS — I10 Essential (primary) hypertension: Secondary | ICD-10-CM | POA: Diagnosis present

## 2023-01-13 DIAGNOSIS — N390 Urinary tract infection, site not specified: Secondary | ICD-10-CM | POA: Diagnosis present

## 2023-01-13 DIAGNOSIS — E1143 Type 2 diabetes mellitus with diabetic autonomic (poly)neuropathy: Secondary | ICD-10-CM | POA: Diagnosis present

## 2023-01-13 DIAGNOSIS — F0390 Unspecified dementia without behavioral disturbance: Secondary | ICD-10-CM | POA: Diagnosis present

## 2023-01-13 DIAGNOSIS — Z903 Acquired absence of stomach [part of]: Secondary | ICD-10-CM | POA: Diagnosis not present

## 2023-01-13 LAB — CBC
HCT: 38.6 % (ref 36.0–46.0)
Hemoglobin: 12.6 g/dL (ref 12.0–15.0)
MCH: 23.6 pg — ABNORMAL LOW (ref 26.0–34.0)
MCHC: 32.6 g/dL (ref 30.0–36.0)
MCV: 72.1 fL — ABNORMAL LOW (ref 80.0–100.0)
Platelets: 127 10*3/uL — ABNORMAL LOW (ref 150–400)
RBC: 5.35 MIL/uL — ABNORMAL HIGH (ref 3.87–5.11)
RDW: 15.7 % — ABNORMAL HIGH (ref 11.5–15.5)
WBC: 7.5 10*3/uL (ref 4.0–10.5)
nRBC: 0 % (ref 0.0–0.2)

## 2023-01-13 LAB — URINALYSIS, W/ REFLEX TO CULTURE (INFECTION SUSPECTED)
Bilirubin Urine: NEGATIVE
Glucose, UA: NEGATIVE mg/dL
Ketones, ur: 5 mg/dL — AB
Nitrite: NEGATIVE
Protein, ur: NEGATIVE mg/dL
Specific Gravity, Urine: 1.03 (ref 1.005–1.030)
WBC, UA: >50 WBC/hpf (ref 0–5)
pH: 5 (ref 5.0–8.0)

## 2023-01-13 LAB — GLUCOSE, CAPILLARY
Glucose-Capillary: 83 mg/dL (ref 70–99)
Glucose-Capillary: 89 mg/dL (ref 70–99)
Glucose-Capillary: 86 mg/dL (ref 70–99)
Glucose-Capillary: 92 mg/dL (ref 70–99)
Glucose-Capillary: 91 mg/dL (ref 70–99)

## 2023-01-13 LAB — BASIC METABOLIC PANEL
Anion gap: 13 (ref 5–15)
BUN: 17 mg/dL (ref 8–23)
CO2: 19 mmol/L — ABNORMAL LOW (ref 22–32)
Calcium: 8.3 mg/dL — ABNORMAL LOW (ref 8.9–10.3)
Chloride: 107 mmol/L (ref 98–111)
Creatinine, Ser: 1.03 mg/dL — ABNORMAL HIGH (ref 0.44–1.00)
GFR, Estimated: 54 mL/min — ABNORMAL LOW (ref >60)
Glucose, Bld: 83 mg/dL (ref 70–99)
Potassium: 4.5 mmol/L (ref 3.5–5.1)
Sodium: 139 mmol/L (ref 135–145)

## 2023-01-13 LAB — MAGNESIUM: Magnesium: 1.8 mg/dL (ref 1.7–2.4)

## 2023-01-13 MED ORDER — INSULIN ASPART 100 UNIT/ML IJ SOLN
0.0000 [IU] | INTRAMUSCULAR | Status: DC
  Administered 2023-01-14 – 2023-01-15 (×2): 2 [IU] via SUBCUTANEOUS
  Administered 2023-01-15 – 2023-01-16 (×2): 1 [IU] via SUBCUTANEOUS

## 2023-01-13 MED ORDER — SODIUM CHLORIDE 0.9 % IV SOLN
1.0000 g | INTRAVENOUS | Status: DC
Start: 2023-01-13 — End: 2023-01-13

## 2023-01-13 MED ORDER — ACETAMINOPHEN 325 MG PO TABS
650.0000 mg | ORAL_TABLET | Freq: Four times a day (QID) | ORAL | Status: DC | PRN
  Administered 2023-01-14 – 2023-01-16 (×3): 650 mg via ORAL
  Filled 2023-01-13 (×3): qty 2

## 2023-01-13 MED ORDER — FENTANYL CITRATE PF 50 MCG/ML IJ SOSY
12.5000 ug | PREFILLED_SYRINGE | INTRAMUSCULAR | Status: DC | PRN
  Administered 2023-01-13 – 2023-01-17 (×6): 50 ug via INTRAVENOUS
  Filled 2023-01-13 (×6): qty 1

## 2023-01-13 MED ORDER — SODIUM CHLORIDE 0.9 % IV SOLN
2.0000 g | INTRAVENOUS | Status: DC
  Administered 2023-01-13 – 2023-01-15 (×3): 2 g via INTRAVENOUS
  Filled 2023-01-13 (×4): qty 20

## 2023-01-13 MED ORDER — SODIUM CHLORIDE 0.45 % IV SOLN: INTRAVENOUS | Status: AC

## 2023-01-13 MED ORDER — METRONIDAZOLE 500 MG/100ML IV SOLN
500.0000 mg | Freq: Two times a day (BID) | INTRAVENOUS | Status: DC
  Administered 2023-01-13 – 2023-01-15 (×6): 500 mg via INTRAVENOUS
  Filled 2023-01-13 (×7): qty 100

## 2023-01-13 MED ORDER — ACETAMINOPHEN 650 MG RE SUPP: 650.0000 mg | Freq: Four times a day (QID) | RECTAL | Status: DC | PRN

## 2023-01-13 MED ORDER — PROCHLORPERAZINE EDISYLATE 10 MG/2ML IJ SOLN
5.0000 mg | INTRAMUSCULAR | Status: DC | PRN
  Administered 2023-01-13 – 2023-01-14 (×2): 5 mg via INTRAVENOUS
  Filled 2023-01-13 (×2): qty 2
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
## 2023-01-13 NOTE — Progress Notes (Signed)
[facility] PROGRESS NOTE   Emily [name] JYN:[id number] DOB: [birth year] DOA: 01/12/2023  PCP: Georganna Skeans, MD  Brief History/Interval Summary:  82 y.o. 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  Imaging studies suggested early small bowel obstruction.  There was also concern for colitis.  Patient was hospitalized for further management.  Consultants: General surgery  Procedures: None yet    Subjective/Interval History: Patient is a poor historian.  No diarrhea reported by nursing staff.    Assessment/Plan:  Abdominal pain with concern raised for partial small bowel obstruction/possible colitis General Surgery consulted.  CT scan also raised concern for colitis.  However no diarrhea has been reported.  WBC is normal.  She is afebrile.  Hypoalbuminemia could be contributing to bowel wall thickening.  Abnormal UA Due to her dementia.  Unable to accurately assess if she is truly having symptoms suggestive of UTI or not.  WBC is noted to be normal.  Nonspecific: Findings also noted.  May not be unreasonable to place her empirically on ceftriaxone and metronidazole for now.  Essential hypertension Continue to monitor blood pressures.  Diabetes mellitus type 2 HbA1c was 5.3 in May.  Continue just with SSI.  History of dementia Delirium precautions.  Stage II sacral decubitus Pressure Injury 01/13/23 Sacrum Medial Stage 2 -  Partial thickness loss of dermis presenting as a shallow open injury with a red, pink wound bed without slough. (Active)  01/13/23 0359  Location: Sacrum  Location Orientation: Medial  Staging: Stage 2 -  Partial thickness loss of dermis presenting as a shallow open injury with a red,  pink wound bed without slough.  Wound Description (Comments):   Present on Admission: Yes    DVT Prophylaxis: SCDs for now Code Status: Full code Family Communication: No family at bedside.  Will update family later today Disposition Plan: To be determined  Status is: Observation The patient will require care spanning > 2 midnights and should be moved to inpatient because: Abdominal pain, concern for SBO      Medications: Scheduled:  insulin aspart  0-6 Units Subcutaneous Q4H   Continuous:  sodium chloride 75 mL/hr at 01/13/23 0700   QMV:[id number] **OR** acetaminophen, fentaNYL (SUBLIMAZE) injection, prochlorperazine  Antibiotics: Anti-infectives (From admission, onward)    None       Objective:  Vital Signs  Vitals:   01/13/23 0315 01/13/23 0330 01/13/23 0359 01/13/23 0908  BP: (!) 128/110 (!) 159/74 (!) 176/91 (!) 167/85  Pulse: 69 70 78 90  Resp: 13 15 18 17   Temp:   98.4 F (36.9 C) 98.1 F (36.7 C)  TempSrc:   Oral Oral  SpO2: 100% 100% 100% 98%  Weight:   32.7 kg   Height:   5\' 2"  (1.575 m)     Intake/Output Summary (Last 24 hours) at 01/13/2023 0915 Last data filed at 01/13/2023 0700 Gross per 24 hour  Intake 215.59 ml  Output --  Net 215.59 ml   Filed Weights   01/13/23 0359  Weight: 32.7 kg    General appearance: Awake alert.  In no distress.  Distracted Resp: Clear to auscultation bilaterally.  Normal effort Cardio: S1-S2 is normal regular.  No S3-S4.  No rubs murmurs or bruit GI: Abdomen is soft.  Tender diffusely without any rebound rigidity or guarding.  No masses  organomegaly. Extremities: No edema.  Moving all 4 extremities.  Physical deconditioning is noted. Neurologic: Disoriented.  No focal neurological deficits.    Lab Results:  Data Reviewed: I have personally reviewed following labs and reports of the imaging studies  CBC: Recent Labs  Lab 01/12/23 2027 01/13/23 0507  WBC 9.7 7.5  NEUTROABS 6.9  --   HGB 12.0  12.6  HCT 37.4 38.6  MCV 73.2* 72.1*  PLT 206 127*    Basic Metabolic Panel: Recent Labs  Lab 01/12/23 2153 01/13/23 0507  NA 140 139  K 4.0 4.5  CL 107 107  CO2 23 19*  GLUCOSE 94 83  BUN 20 17  CREATININE 1.03* 1.03*  CALCIUM 8.0* 8.3*  MG  --  1.8    GFR: Estimated Creatinine Clearance: 21.7 mL/min (A) (by C-G formula based on SCr of 1.03 mg/dL (H)).  Liver Function Tests: Recent Labs  Lab 01/12/23 2153  AST 25  ALT 15  ALKPHOS 82  BILITOT 0.8  PROT 5.2*  ALBUMIN 2.0*    Recent Labs  Lab 01/12/23 2153  LIPASE 21     CBG: Recent Labs  Lab 01/13/23 0359  GLUCAP 83    Radiology Studies: CT ABDOMEN PELVIS W CONTRAST  Result Date: 01/12/2023 CLINICAL DATA:  Abdominal pain, acute, nonlocalized. EXAM: CT ABDOMEN AND PELVIS WITH CONTRAST TECHNIQUE: Multidetector CT imaging of the abdomen and pelvis was performed using the standard protocol following bolus administration of intravenous contrast. RADIATION DOSE REDUCTION: This exam was performed according to the departmental dose-optimization program which includes automated exposure control, adjustment of the mA and/or kV according to patient size and/or use of iterative reconstruction technique. CONTRAST:  60mL OMNIPAQUE IOHEXOL 350 MG/ML SOLN COMPARISON:  09/03/2022. FINDINGS: Lower chest: There is a small left pleural effusion with atelectasis at the left lung base. Hepatobiliary: No focal liver abnormality is seen. Status post cholecystectomy. The common bile duct is distended, similar in appearance to the prior exam. Pancreas: Pancreatic atrophy is noted. Evaluation is limited due to motion artifact. No pancreatic ductal dilatation or surrounding inflammatory changes. Spleen: Normal in size without focal abnormality. Adrenals/Urinary Tract: The adrenal glands are within normal limits. There is right renal atrophy with cortical thinning and a cyst. Subcentimeter hypodensities are present bilaterally which are too  small to further characterize. A nonobstructive calculus is present on the right. No hydronephrosis bilaterally. Bladder diverticula are present bilaterally. Stomach/Bowel: Gastric surgical changes are noted. Appendix is not seen. There is a prominent loop of jejunum in the left lower quadrant measuring up to 4.2 cm proximal to an anastomotic site. No definite associated bowel wall thickening or surrounding fat stranding, however examination is limited due to paucity of intra-abdominal fat and lack of oral contrast. There is bowel wall thickening involving the proximal descending colon. No free air or pneumatosis. Vascular/Lymphatic: Aortic atherosclerosis. No enlarged abdominal or pelvic lymph nodes. Reproductive: Uterus and bilateral adnexa are unremarkable. Other: Small amount of ascites is noted in the presacral space. There stable marked cachexia with body wall edema. Musculoskeletal: No acute fracture is seen. IMPRESSION: 1. Distended loop of small bowel in the mid left abdomen measuring 4.2 cm in diameter proximal to a anastomotic site, possible partial or early small bowel obstruction. 2. Thickening of the walls of the proximal descending colon, suggesting infectious or inflammatory colitis. 3. Diverticulosis without diverticulitis. 4. Nonobstructive right renal calculus. 5. Small left pleural effusion with atelectasis. 6. Mild ascites.  Persistent marked cachexia with body wall edema. 7.  Aortic atherosclerosis. Electronically Signed   By: Thornell Sartorius M.D.   On: 01/12/2023 23:52       LOS: 0 days   Roshard Rezabek Rito Ehrlich  [facility] Pager on [email or web address]  01/13/2023, 9:15 AM
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
## 2023-01-13 NOTE — ED Provider Notes (Signed)
Assumed care at shift change.  See prior note for full H&P.  Briefly, 82 year old female with history of gastric outlet obstruction status post total gastrectomy 2012, here with abdominal pain, nausea, vomiting, and diarrhea.  Plan:  CT pending.  Dispo per results.  Results for orders placed or performed during the hospital encounter of 01/12/23  CBC with Differential  Result Value Ref Range   WBC 9.7 4.0 - 10.5 K/uL   RBC 5.11 3.87 - 5.11 MIL/uL   Hemoglobin 12.0 12.0 - 15.0 g/dL   HCT 09.8 11.9 - 14.7 %   MCV 73.2 (L) 80.0 - 100.0 fL   MCH 23.5 (L) 26.0 - 34.0 pg   MCHC 32.1 30.0 - 36.0 g/dL   RDW 82.9 (H) 56.2 - 13.0 %   Platelets 206 150 - 400 K/uL   nRBC 0.0 0.0 - 0.2 %   Neutrophils Relative % 72 %   Neutro Abs 6.9 1.7 - 7.7 K/uL   Lymphocytes Relative 22 %   Lymphs Abs 2.1 0.7 - 4.0 K/uL   Monocytes Relative 6 %   Monocytes Absolute 0.6 0.1 - 1.0 K/uL   Eosinophils Relative 0 %   Eosinophils Absolute 0.0 0.0 - 0.5 K/uL   Basophils Relative 0 %   Basophils Absolute 0.0 0.0 - 0.1 K/uL   Immature Granulocytes 0 %   Abs Immature Granulocytes 0.03 0.00 - 0.07 K/uL  Comprehensive metabolic panel  Result Value Ref Range   Sodium 140 135 - 145 mmol/L   Potassium 4.0 3.5 - 5.1 mmol/L   Chloride 107 98 - 111 mmol/L   CO2 23 22 - 32 mmol/L   Glucose, Bld 94 70 - 99 mg/dL   BUN 20 8 - 23 mg/dL   Creatinine, Ser 8.65 (H) 0.44 - 1.00 mg/dL   Calcium 8.0 (L) 8.9 - 10.3 mg/dL   Total Protein 5.2 (L) 6.5 - 8.1 g/dL   Albumin 2.0 (L) 3.5 - 5.0 g/dL   AST 25 15 - 41 U/L   ALT 15 0 - 44 U/L   Alkaline Phosphatase 82 38 - 126 U/L   Total Bilirubin 0.8 0.3 - 1.2 mg/dL   GFR, Estimated 54 (L) >60 mL/min   Anion gap 10 5 - 15  Lipase, blood  Result Value Ref Range   Lipase 21 11 - 51 U/L   *Note: Due to a large number of results and/or encounters for the requested time period, some results have not been displayed. A complete set of results can be found in Results Review.   CT  ABDOMEN PELVIS W CONTRAST  Result Date: 01/12/2023 CLINICAL DATA:  Abdominal pain, acute, nonlocalized. EXAM: CT ABDOMEN AND PELVIS WITH CONTRAST TECHNIQUE: Multidetector CT imaging of the abdomen and pelvis was performed using the standard protocol following bolus administration of intravenous contrast. RADIATION DOSE REDUCTION: This exam was performed according to the departmental dose-optimization program which includes automated exposure control, adjustment of the mA and/or kV according to patient size and/or use of iterative reconstruction technique. CONTRAST:  60mL OMNIPAQUE IOHEXOL 350 MG/ML SOLN COMPARISON:  09/03/2022. FINDINGS: Lower chest: There is a small left pleural effusion with atelectasis at the left lung base. Hepatobiliary: No focal liver abnormality is seen. Status post cholecystectomy. The common bile duct is distended, similar in appearance to the prior exam. Pancreas: Pancreatic atrophy is noted. Evaluation is limited due to motion artifact. No pancreatic ductal dilatation or surrounding inflammatory changes. Spleen: Normal in size without focal abnormality. Adrenals/Urinary Tract: The adrenal  glands are within normal limits. There is right renal atrophy with cortical thinning and a cyst. Subcentimeter hypodensities are present bilaterally which are too small to further characterize. A nonobstructive calculus is present on the right. No hydronephrosis bilaterally. Bladder diverticula are present bilaterally. Stomach/Bowel: Gastric surgical changes are noted. Appendix is not seen. There is a prominent loop of jejunum in the left lower quadrant measuring up to 4.2 cm proximal to an anastomotic site. No definite associated bowel wall thickening or surrounding fat stranding, however examination is limited due to paucity of intra-abdominal fat and lack of oral contrast. There is bowel wall thickening involving the proximal descending colon. No free air or pneumatosis. Vascular/Lymphatic: Aortic  atherosclerosis. No enlarged abdominal or pelvic lymph nodes. Reproductive: Uterus and bilateral adnexa are unremarkable. Other: Small amount of ascites is noted in the presacral space. There stable marked cachexia with body wall edema. Musculoskeletal: No acute fracture is seen. IMPRESSION: 1. Distended loop of small bowel in the mid left abdomen measuring 4.2 cm in diameter proximal to a anastomotic site, possible partial or early small bowel obstruction. 2. Thickening of the walls of the proximal descending colon, suggesting infectious or inflammatory colitis. 3. Diverticulosis without diverticulitis. 4. Nonobstructive right renal calculus. 5. Small left pleural effusion with atelectasis. 6. Mild ascites.  Persistent marked cachexia with body wall edema. 7. Aortic atherosclerosis. Electronically Signed   By: Thornell Sartorius M.D.   On: 01/12/2023 23:52   DG Foot Complete Right  Result Date: 12/17/2022 Please see detailed radiograph report in office note.   CT with findings of partial/early SBO.  Also has findings of possible colitis.  No recent abx prescribed to her but she has been having diarrhea per her report.  She is afebrile, no leukocytosis.  She is on bowel regimen for chronic constipation as well.  1:33 AM Spoke with on cell general surgery, Dr. Derrell Lolling-- if not actively vomiting, recommends to hold off on NG for now given her total gastectromy.  They will follow-up in AM.  Family has been updated they are agreeable with admission.    Spoke with hospitalist, Dr. Antionette Char-- will admit for ongoing care.   Garlon Hatchet, PA-C 01/13/23 0315    Tilden Fossa, MD 01/13/23 [phone]

## 2023-01-13 NOTE — Plan of Care (Signed)

## 2023-01-14 ENCOUNTER — Inpatient Hospital Stay (HOSPITAL_COMMUNITY): Payer: 59

## 2023-01-14 DIAGNOSIS — I10 Essential (primary) hypertension: Secondary | ICD-10-CM | POA: Diagnosis not present

## 2023-01-14 DIAGNOSIS — K56600 Partial intestinal obstruction, unspecified as to cause: Secondary | ICD-10-CM | POA: Diagnosis not present

## 2023-01-14 DIAGNOSIS — R8290 Unspecified abnormal findings in urine: Secondary | ICD-10-CM

## 2023-01-14 LAB — BASIC METABOLIC PANEL
Anion gap: 15 (ref 5–15)
BUN: 11 mg/dL (ref 8–23)
CO2: 18 mmol/L — ABNORMAL LOW (ref 22–32)
Calcium: 8 mg/dL — ABNORMAL LOW (ref 8.9–10.3)
Chloride: 103 mmol/L (ref 98–111)
Creatinine, Ser: 0.9 mg/dL (ref 0.44–1.00)
GFR, Estimated: >60 mL/min (ref >60)
Glucose, Bld: 59 mg/dL — ABNORMAL LOW (ref 70–99)
Potassium: 4.8 mmol/L (ref 3.5–5.1)
Sodium: 136 mmol/L (ref 135–145)

## 2023-01-14 LAB — URINE CULTURE

## 2023-01-14 LAB — HEMOGLOBIN A1C
Hgb A1c MFr Bld: 4.9 % (ref 4.8–5.6)
Mean Plasma Glucose: 94 mg/dL

## 2023-01-14 LAB — GLUCOSE, CAPILLARY
Glucose-Capillary: 122 mg/dL — ABNORMAL HIGH (ref 70–99)
Glucose-Capillary: 212 mg/dL — ABNORMAL HIGH (ref 70–99)
Glucose-Capillary: 67 mg/dL — ABNORMAL LOW (ref 70–99)
Glucose-Capillary: 71 mg/dL (ref 70–99)
Glucose-Capillary: 74 mg/dL (ref 70–99)
Glucose-Capillary: 75 mg/dL (ref 70–99)
Glucose-Capillary: 83 mg/dL (ref 70–99)
Glucose-Capillary: 85 mg/dL (ref 70–99)

## 2023-01-14 MED ORDER — POLYETHYLENE GLYCOL 3350 17 G PO PACK
17.0000 g | PACK | Freq: Every day | ORAL | Status: DC
  Administered 2023-01-14: 17 g via ORAL
  Filled 2023-01-14 (×2): qty 1

## 2023-01-14 MED ORDER — DEXTROSE 50 % IV SOLN
12.5000 g | INTRAVENOUS | Status: AC
  Administered 2023-01-14: 12.5 g via INTRAVENOUS
  Filled 2023-01-14: qty 50

## 2023-01-14 MED ORDER — DEXTROSE-SODIUM CHLORIDE 5-0.9 % IV SOLN: INTRAVENOUS | Status: DC

## 2023-01-14 MED ORDER — LOSARTAN POTASSIUM 25 MG PO TABS
25.0000 mg | ORAL_TABLET | Freq: Every day | ORAL | Status: DC
  Administered 2023-01-14 – 2023-01-17 (×4): 25 mg via ORAL
  Filled 2023-01-14 (×4): qty 1
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
## 2023-01-14 NOTE — Plan of Care (Signed)
  Problem: Coping: Goal: Ability to adjust to condition or change in health will improve Outcome: Progressing   Problem: Fluid Volume: Goal: Ability to maintain a balanced intake and output will improve Outcome: Progressing   

## 2023-01-14 NOTE — Plan of Care (Signed)

## 2023-01-14 NOTE — Plan of Care (Signed)
  Problem: Education: Goal: Ability to describe self-care measures that may prevent or decrease complications (Diabetes Survival Skills Education) will improve Outcome: Progressing Goal: Individualized Educational Video(s) Outcome: Progressing   Problem: Coping: Goal: Ability to adjust to condition or change in health will improve Outcome: Progressing   

## 2023-01-14 NOTE — Progress Notes (Signed)
Hypoglycemic Event  CBG: 67  Treatment: D50 25 mL (12.5 gm)  Symptoms: None  Follow-up CBG: Time:0405 CBG Result:122  Possible Reasons for Event: Other:   NPO  Comments/MD notified:Abigail Vivien Presto

## 2023-01-14 NOTE — Progress Notes (Addendum)
[facility] PROGRESS NOTE   Emily [name]:[id number] DOB: [birth year] DOA: 01/12/2023  PCP: Georganna Skeans, MD  Brief History/Interval Summary:  82 y.o. 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  Imaging studies suggested early small bowel obstruction.  There was also concern for colitis.  Patient was hospitalized for further management.  Consultants: General surgery  Procedures: None yet    Subjective/Interval History: Patient is a poor historian.  Does not appear to be in any discomfort.  Denies any abdominal pain this morning.     Assessment/Plan:  Abdominal pain with concern raised for partial small bowel obstruction/possible colitis CT scan also raised concern for colitis.  No diarrhea was reported. She did have a bowel movement yesterday morning.  Abdomen seems to be benign this morning.  Nontender. Will repeat abdominal film. It does not appear that general surgery ever saw this patient.  If abdominal film continues to show concern for SBO we will consult them again. If x-ray shows improvement then we will challenge her with clear liquid diet.   Continue empiric antibiotics for now.  Abnormal UA/Possible UTI Due to her dementia unable to accurately assess if she is truly having symptoms suggestive of UTI or not.  WBC is noted to be normal.   Continue with antibiotics for now.  Essential hypertension Blood pressure noted to be elevated.  Home medication list has not been reconciled yet.  However it does appear that she is on losartan.  This will be resumed.  Diabetes mellitus type 2/hypoglycemia HbA1c was 5.3 in May.  Low glucose levels noted in the last 24 hours.  Start IV fluids.    History of  dementia Delirium precautions.  Stage II sacral decubitus Pressure Injury 01/13/23 Sacrum Medial Stage 2 -  Partial thickness loss of dermis presenting as a shallow open injury with a red, pink wound bed without slough. (Active)  01/13/23 0359  Location: Sacrum  Location Orientation: Medial  Staging: Stage 2 -  Partial thickness loss of dermis presenting as a shallow open injury with a red, pink wound bed without slough.  Wound Description (Comments):   Present on Admission: Yes    DVT Prophylaxis: SCDs for now Code Status: Full code Family Communication: Daughter was updated yesterday.  Will update again later today. Disposition Plan: To be determined      Medications: Scheduled:  insulin aspart  0-6 Units Subcutaneous Q4H   Continuous:  cefTRIAXone (ROCEPHIN)  IV Stopped (01/13/23 1009)   dextrose 5 % and 0.9 % NaCl     metronidazole Stopped (01/13/23 2335)   WJX:BJYNWGNFAOZHY **OR** acetaminophen, fentaNYL (SUBLIMAZE) injection, prochlorperazine  Antibiotics: Anti-infectives (From admission, onward)    Start     Dose/Rate Route Frequency Ordered Stop   01/13/23 1015  cefTRIAXone (ROCEPHIN) 1 g in sodium chloride 0.9 % 100 mL IVPB  Status:  Discontinued        1 g 200 mL/hr over 30 Minutes Intravenous Every 24 hours 01/13/23 0920 01/13/23 0927   01/13/23 1015  metroNIDAZOLE (FLAGYL) IVPB 500 mg        500 mg 100 mL/hr over 60 Minutes Intravenous Every 12 hours 01/13/23 0920     01/13/23 1015  cefTRIAXone (ROCEPHIN) 2 g in sodium chloride 0.9 % 100 mL IVPB        2  g 200 mL/hr over 30 Minutes Intravenous Every 24 hours 01/13/23 0927         Objective:  Vital Signs  Vitals:   01/13/23 1728 01/13/23 2025 01/14/23 0302 01/14/23 0500  BP: (!) 157/81 (!) 149/73 (!) 169/74   Pulse:  78 70   Resp: 17 17 17    Temp: 98.4 F (36.9 C) 97.9 F (36.6 C) 97.9 F (36.6 C)   TempSrc:  Oral Oral   SpO2: (!) 82% 100% 100%   Weight:    37.3 kg  Height:         Intake/Output Summary (Last 24 hours) at 01/14/2023 0936 Last data filed at 01/14/2023 0650 Gross per 24 hour  Intake 902.63 ml  Output 550 ml  Net 352.63 ml   Filed Weights   01/13/23 0359 01/14/23 0500  Weight: 32.7 kg 37.3 kg    General appearance: Awake alert.  In no distress.  Distracted Resp: Clear to auscultation bilaterally.  Normal effort Cardio: S1-S2 is normal regular.  No S3-S4.  No rubs murmurs or bruit GI: Abdomen is soft.  Nontender nondistended.  Bowel sounds are present normal.  No masses organomegaly Extremities: No edema.     Lab Results:  Data Reviewed: I have personally reviewed following labs and reports of the imaging studies  CBC: Recent Labs  Lab 01/12/23 2027 01/13/23 0507  WBC 9.7 7.5  NEUTROABS 6.9  --   HGB 12.0 12.6  HCT 37.4 38.6  MCV 73.2* 72.1*  PLT 206 127*    Basic Metabolic Panel: Recent Labs  Lab 01/12/23 2153 01/13/23 0507 01/14/23 0242  NA 140 139 136  K 4.0 4.5 4.8  CL 107 107 103  CO2 23 19* 18*  GLUCOSE 94 83 59*  BUN 20 17 11   CREATININE 1.03* 1.03* 0.90  CALCIUM 8.0* 8.3* 8.0*  MG  --  1.8  --     GFR: Estimated Creatinine Clearance: 28.4 mL/min (by C-G formula based on SCr of 0.9 mg/dL).  Liver Function Tests: Recent Labs  Lab 01/12/23 2153  AST 25  ALT 15  ALKPHOS 82  BILITOT 0.8  PROT 5.2*  ALBUMIN 2.0*    Recent Labs  Lab 01/12/23 2153  LIPASE 21     CBG: Recent Labs  Lab 01/13/23 2023 01/14/23 0025 01/14/23 0259 01/14/23 0405 01/14/23 0932  GLUCAP 91 74 67* 122* 83    Radiology Studies: CT ABDOMEN PELVIS W CONTRAST  Result Date: 01/12/2023 CLINICAL DATA:  Abdominal pain, acute, nonlocalized. EXAM: CT ABDOMEN AND PELVIS WITH CONTRAST TECHNIQUE: Multidetector CT imaging of the abdomen and pelvis was performed using the standard protocol following bolus administration of intravenous contrast. RADIATION DOSE REDUCTION: This exam was performed according to the departmental  dose-optimization program which includes automated exposure control, adjustment of the mA and/or kV according to patient size and/or use of iterative reconstruction technique. CONTRAST:  60mL OMNIPAQUE IOHEXOL 350 MG/ML SOLN COMPARISON:  09/03/2022. FINDINGS: Lower chest: There is a small left pleural effusion with atelectasis at the left lung base. Hepatobiliary: No focal liver abnormality is seen. Status post cholecystectomy. The common bile duct is distended, similar in appearance to the prior exam. Pancreas: Pancreatic atrophy is noted. Evaluation is limited due to motion artifact. No pancreatic ductal dilatation or surrounding inflammatory changes. Spleen: Normal in size without focal abnormality. Adrenals/Urinary Tract: The adrenal glands are within normal limits. There is right renal atrophy with cortical thinning and a cyst. Subcentimeter hypodensities are present bilaterally which are too small  to further characterize. A nonobstructive calculus is present on the right. No hydronephrosis bilaterally. Bladder diverticula are present bilaterally. Stomach/Bowel: Gastric surgical changes are noted. Appendix is not seen. There is a prominent loop of jejunum in the left lower quadrant measuring up to 4.2 cm proximal to an anastomotic site. No definite associated bowel wall thickening or surrounding fat stranding, however examination is limited due to paucity of intra-abdominal fat and lack of oral contrast. There is bowel wall thickening involving the proximal descending colon. No free air or pneumatosis. Vascular/Lymphatic: Aortic atherosclerosis. No enlarged abdominal or pelvic lymph nodes. Reproductive: Uterus and bilateral adnexa are unremarkable. Other: Small amount of ascites is noted in the presacral space. There stable marked cachexia with body wall edema. Musculoskeletal: No acute fracture is seen. IMPRESSION: 1. Distended loop of small bowel in the mid left abdomen measuring 4.2 cm in diameter proximal  to a anastomotic site, possible partial or early small bowel obstruction. 2. Thickening of the walls of the proximal descending colon, suggesting infectious or inflammatory colitis. 3. Diverticulosis without diverticulitis. 4. Nonobstructive right renal calculus. 5. Small left pleural effusion with atelectasis. 6. Mild ascites.  Persistent marked cachexia with body wall edema. 7. Aortic atherosclerosis. Electronically Signed   By: Thornell Sartorius M.D.   On: 01/12/2023 23:52       LOS: 1 day   Osvaldo Shipper  [facility] Pager on [email or web address]  01/14/2023, 9:36 AM

## 2023-01-15 ENCOUNTER — Inpatient Hospital Stay (HOSPITAL_COMMUNITY): Payer: 59

## 2023-01-15 DIAGNOSIS — R8290 Unspecified abnormal findings in urine: Secondary | ICD-10-CM | POA: Diagnosis not present

## 2023-01-15 DIAGNOSIS — I10 Essential (primary) hypertension: Secondary | ICD-10-CM | POA: Diagnosis not present

## 2023-01-15 DIAGNOSIS — K56600 Partial intestinal obstruction, unspecified as to cause: Secondary | ICD-10-CM | POA: Diagnosis not present

## 2023-01-15 LAB — CBC
HCT: 37.7 % (ref 36.0–46.0)
Hemoglobin: 12.7 g/dL (ref 12.0–15.0)
MCH: 24.1 pg — ABNORMAL LOW (ref 26.0–34.0)
MCHC: 33.7 g/dL (ref 30.0–36.0)
MCV: 71.7 fL — ABNORMAL LOW (ref 80.0–100.0)
Platelets: 199 10*3/uL (ref 150–400)
RBC: 5.26 MIL/uL — ABNORMAL HIGH (ref 3.87–5.11)
RDW: 15.2 % (ref 11.5–15.5)
WBC: 8.1 10*3/uL (ref 4.0–10.5)
nRBC: 0 % (ref 0.0–0.2)

## 2023-01-15 LAB — BASIC METABOLIC PANEL
Anion gap: 7 (ref 5–15)
BUN: 8 mg/dL (ref 8–23)
CO2: 22 mmol/L (ref 22–32)
Calcium: 7.9 mg/dL — ABNORMAL LOW (ref 8.9–10.3)
Chloride: 107 mmol/L (ref 98–111)
Creatinine, Ser: 0.82 mg/dL (ref 0.44–1.00)
GFR, Estimated: >60 mL/min (ref >60)
Glucose, Bld: 63 mg/dL — ABNORMAL LOW (ref 70–99)
Potassium: 2.5 mmol/L — CL (ref 3.5–5.1)
Sodium: 136 mmol/L (ref 135–145)

## 2023-01-15 LAB — MAGNESIUM: Magnesium: 1.4 mg/dL — ABNORMAL LOW (ref 1.7–2.4)

## 2023-01-15 LAB — GLUCOSE, CAPILLARY
Glucose-Capillary: 105 mg/dL — ABNORMAL HIGH (ref 70–99)
Glucose-Capillary: 158 mg/dL — ABNORMAL HIGH (ref 70–99)
Glucose-Capillary: 211 mg/dL — ABNORMAL HIGH (ref 70–99)
Glucose-Capillary: 37 mg/dL — CL (ref 70–99)
Glucose-Capillary: 80 mg/dL (ref 70–99)
Glucose-Capillary: 86 mg/dL (ref 70–99)
Glucose-Capillary: 94 mg/dL (ref 70–99)

## 2023-01-15 LAB — POTASSIUM: Potassium: 3.1 mmol/L — ABNORMAL LOW (ref 3.5–5.1)

## 2023-01-15 MED ORDER — METOPROLOL TARTRATE 12.5 MG HALF TABLET
12.5000 mg | ORAL_TABLET | Freq: Two times a day (BID) | ORAL | Status: DC
  Administered 2023-01-15 – 2023-01-17 (×5): 12.5 mg via ORAL
  Filled 2023-01-15 (×5): qty 1

## 2023-01-15 MED ORDER — SODIUM CHLORIDE 0.9% FLUSH: 10.0000 mL | INTRAVENOUS | Status: DC | PRN

## 2023-01-15 MED ORDER — MAGNESIUM SULFATE 4 GM/100ML IV SOLN
4.0000 g | Freq: Once | INTRAVENOUS | Status: AC
  Administered 2023-01-15: 4 g via INTRAVENOUS
  Filled 2023-01-15: qty 100

## 2023-01-15 MED ORDER — SODIUM CHLORIDE 0.9% FLUSH
10.0000 mL | Freq: Two times a day (BID) | INTRAVENOUS | Status: DC
  Administered 2023-01-15 – 2023-01-16 (×3): 10 mL

## 2023-01-15 MED ORDER — POTASSIUM CHLORIDE CRYS ER 20 MEQ PO TBCR
60.0000 meq | EXTENDED_RELEASE_TABLET | Freq: Once | ORAL | Status: AC
  Administered 2023-01-15: 60 meq via ORAL
  Filled 2023-01-15: qty 3

## 2023-01-15 MED ORDER — POTASSIUM CHLORIDE CRYS ER 20 MEQ PO TBCR
40.0000 meq | EXTENDED_RELEASE_TABLET | ORAL | Status: AC
  Administered 2023-01-15 (×2): 40 meq via ORAL
  Filled 2023-01-15 (×2): qty 2

## 2023-01-15 MED ORDER — DEXTROSE-SODIUM CHLORIDE 5-0.9 % IV SOLN: INTRAVENOUS | Status: AC

## 2023-01-15 MED ORDER — MELATONIN 3 MG PO TABS
3.0000 mg | ORAL_TABLET | Freq: Every evening | ORAL | Status: DC | PRN
  Administered 2023-01-15 – 2023-01-16 (×2): 3 mg via ORAL
  Filled 2023-01-15 (×2): qty 1

## 2023-01-15 MED ORDER — POLYETHYLENE GLYCOL 3350 17 G PO PACK: 17.0000 g | PACK | Freq: Every day | ORAL | Status: DC | PRN

## 2023-01-15 MED ORDER — GLUCOSE 40 % PO GEL
2.0000 | ORAL | Status: AC
  Administered 2023-01-15: 62 g via ORAL

## 2023-01-15 MED ORDER — GLUCOSE 40 % PO GEL
ORAL | Status: AC
  Filled 2023-01-15: qty 1.21
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
## 2023-01-15 NOTE — TOC Initial Note (Addendum)
Transition of Care [facility]) - Initial/Assessment Note    Patient Details  Name: Emily [name] MRN: [id number] Date of Birth: 03-11-1941  Transition of Care [facility]) CM/SW Contact:    Kingsley Plan, RN Phone Number: 01/15/2023, 3:26 PM  Clinical Narrative:                  Spoke to patient at bedside and daughter via phone. PT recommending HHPT, wheelchair, consider transport chair. Spoke to OT in hallway , they thought patient could manage transport chair better since it is lighter and narrower.   Discussed with daughter , daughter prefers transport chair and wants it sent to home address ( confirmed address) . Daughter aware if it is drop shipped it will take 3 to 5 business days. Ordered with Mitch with [employer].   Daughter has no preference for home health agency. Patient recently had [facility]. Amy with [facility] accepted referral. Secure chatted MD for orders    Patient has RW, Rolator, hospital bed and 3 in 1 at home  Son lives with patient  Expected Discharge Plan: Home w Home Health Services Barriers to Discharge: Continued Medical Work up   Patient Goals and CMS Choice Patient states their goals for this hospitalization and ongoing recovery are:: to return to home CMS Medicare.gov Compare Post Acute Care list provided to:: Patient Choice offered to / list presented to : Patient [place] ownership interest in [facility].provided to:: Patient    Expected Discharge Plan and Services     Post Acute Care Choice: Home Health Living arrangements for the past 2 months: Single Family Home                 DME Arranged:  (transport chair) DME Agency: [facility] Date DME Agency Contacted: 01/15/23 Time DME Agency Contacted: [phone] Representative spoke with at DME Agency: Mitch HH Arranged: PT, OT HH Agency: [facility] Date [facility] Agency Contacted: 01/15/23 Time HH Agency Contacted: 1522 Representative spoke with at [facility] Agency: Amy  Prior Living  Arrangements/Services Living arrangements for the past 2 months: Single Family Home Lives with:: Adult Children Patient language and need for interpreter reviewed:: Yes Do you feel safe going back to the place where you live?: Yes      Need for Family Participation in Patient Care: Yes (Comment) Care giver support system in place?: Yes (comment) Current home services: DME Criminal Activity/Legal Involvement Pertinent to Current Situation/Hospitalization: No - Comment as needed  Activities of Daily Living      Permission Sought/Granted   Permission granted to share information with : Yes, Verbal Permission Granted  Share Information with NAME: daughter Nikoletta Pinchback  Permission granted to share info w AGENCY: [facility], [facility]        Emotional Assessment Appearance:: Appears stated age Attitude/Demeanor/Rapport: Engaged Affect (typically observed): Accepting Orientation: : Oriented to Self, Oriented to Place, Oriented to  Time, Oriented to Situation Alcohol / Substance Use: Not Applicable Psych Involvement: No (comment)  Admission diagnosis:  SBO (small bowel obstruction) (HCC) [K56.609] Partial small bowel obstruction (HCC) [K56.600] Patient Active Problem List   Diagnosis Date Noted   Partial small bowel obstruction (HCC) 01/13/2023   SBO (small bowel obstruction) (HCC) 01/13/2023   Pressure injury of skin 01/13/2023   Protein-calorie malnutrition, severe 10/06/2022   Acute lower GI bleeding 09/04/2022   Acute blood loss anemia 09/04/2022   Fecal impaction (HCC) 09/04/2022   Urinary incontinence 09/04/2022   Therapeutic opioid induced constipation 05/12/2022   Abdominal pain 05/12/2022  Gait abnormality 10/24/2020   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   Postural dizziness 07/05/2019   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   Pain due to onychomycosis of toenails of both feet 11/09/2018   Dermatitis 11/09/2018   Age-related nuclear  cataract of right eye 06/15/2018   Primary open angle glaucoma (POAG) of both eyes, indeterminate stage 06/15/2018   Orthostatic syncope 05/24/2018   History of alcohol use 05/24/2018   Vitamin B12 deficiency 05/24/2018   Polypharmacy 05/24/2018   Syncope 05/23/2018   B12 deficiency anemia 05/04/2018   Unilateral primary osteoarthritis, right knee 11/24/2017   Primary osteoarthritis of left knee 11/24/2017   Alcoholic gastritis 06/19/2017   Alcohol consumption binge drinking 06/19/2017   GERD (gastroesophageal reflux disease) 06/19/2017   Hypertension 06/19/2017   History of Gastric outlet obstruction s/p resection 06/19/2017   Chronic pain 06/19/2017   Constipation 05/13/2017   Pain in right lower leg 01/18/2017   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HCC) 09/18/2016   Acute appendicitis 08/21/2016   Chronic pain of left knee 06/01/2016   Chronic pain of right knee 06/01/2016   Cerebral infarction due to unspecified mechanism    Nausea without vomiting 06/26/2014   Small vessel disease, 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   S/P total gastrectomy and Roux-en-Y esophagojejunal anastomosis 2012 10/21/2012   Esophageal dysmotility with poor peristalsis 10/21/2012   Chronic abdominal pain 10/20/2012   Diarrhea 10/20/2012   CAP (community acquired pneumonia) 08/16/2012   Dilation of biliary tract 08/16/2012   Elevated LFTs 08/16/2012   UTI (urinary tract infection) 08/13/2012   Leukocytosis 08/12/2012   Hyponatremia 08/12/2012   Acute kidney failure (HCC) 08/12/2012   Anemia 06/15/2012   Gastroparesis 02/18/2011   Dysphagia 07/30/2010   HYPOKALEMIA 03/11/2010   Nausea with vomiting 06/03/2009   GI BLEED 05/18/2008   Abdominal pain, chronic, epigastric 05/18/2008   ANEMIA, IRON DEFICIENCY 01/24/2008   Essential hypertension 10/07/2007   DIVERTICULOSIS, COLON 10/07/2007   DM2 (diabetes mellitus, type 2) (HCC) 10/02/2006   DISORDER, DEPRESSIVE NEC 10/02/2006   GERD  10/02/2006   PCP:  Georganna Skeans, MD Pharmacy:   [facility] [id number] - [place], [place] - [address] AT [address] [place] [address] Phone: [phone] Fax: [phone]  [id number] - [place]), [place] - [address] [place] ([place] [address] Phone: [phone] Fax: [phone]  [id number] - [place], [place] - [address] AT [facility] [address] [facility] [address]  [place] [address] Phone: [phone] Fax: [phone]     Social Determinants of Health (SDOH) Social History: SDOH Screenings   Food Insecurity: Low Risk  (07/24/2022)   [facility]  Transportation Needs: No Transportation Needs (07/24/2022)   [facility]  Utilities: Low Risk  (07/24/2022)   [facility]  Depression (PHQ2-9): Low Risk  (01/06/2023)  Tobacco Use: High Risk (01/13/2023)   SDOH Interventions:     Readmission Risk Interventions     No data to display

## 2023-01-15 NOTE — Progress Notes (Signed)
Subjective: Doing ok today.  Wants some solid food.  Just had a large liquid BM.  No nausea  ROS: See above, otherwise other systems negative  Objective: Vital signs in last 24 hours: Temp:  [97.5 F (36.4 C)-97.9 F (36.6 C)] 97.8 F (36.6 C) (09/13 0615) Pulse Rate:  [83-136] 136 (09/13 0615) Resp:  [16-17] 17 (09/13 0615) BP: (126-140)/(73-77) 140/76 (09/13 0615) SpO2:  [88 %-99 %] 99 % (09/13 0615) Last BM Date : 01/13/23  Intake/Output from previous day: 09/12 0701 - 09/13 0700 In: 480 [P.O.:480] Out: 400 [Urine:400] Intake/Output this shift: No intake/output data recorded.  PE: Abd: soft, less tender, ND  Lab Results:  Recent Labs    01/13/23 0507 01/15/23 0510  WBC 7.5 8.1  HGB 12.6 12.7  HCT 38.6 37.7  PLT 127* 199   BMET Recent Labs    01/14/23 0242 01/15/23 0510  NA 136 136  K 4.8 2.5*  CL 103 107  CO2 18* 22  GLUCOSE 59* 63*  BUN 11 8  CREATININE 0.90 0.82  CALCIUM 8.0* 7.9*   PT/INR No results for input(s): "LABPROT", "INR" in the last 72 hours. CMP     Component Value Date/Time   NA 136 01/15/2023 0510   NA 140 12/11/2016 1117   K 2.5 (LL) 01/15/2023 0510   K 4.5 12/11/2016 1117   CL 107 01/15/2023 0510   CO2 22 01/15/2023 0510   CO2 23 12/11/2016 1117   GLUCOSE 63 (L) 01/15/2023 0510   GLUCOSE 90 12/11/2016 1117   BUN 8 01/15/2023 0510   BUN 21.9 12/11/2016 1117   CREATININE 0.82 01/15/2023 0510   CREATININE 1.16 (H) 11/23/2022 1339   CREATININE 0.9 12/11/2016 1117   CALCIUM 7.9 (L) 01/15/2023 0510   CALCIUM 9.0 12/11/2016 1117   PROT 5.2 (L) 01/12/2023 2153   PROT 7.0 12/11/2016 1117   ALBUMIN 2.0 (L) 01/12/2023 2153   ALBUMIN 3.8 12/11/2016 1117   AST 25 01/12/2023 2153   AST 60 (H) 11/23/2022 1339   AST 29 12/11/2016 1117   ALT 15 01/12/2023 2153   ALT 57 (H) 11/23/2022 1339   ALT 44 12/11/2016 1117   ALKPHOS 82 01/12/2023 2153   ALKPHOS 94 12/11/2016 1117   BILITOT 0.8 01/12/2023 2153   BILITOT 0.4  11/23/2022 1339   BILITOT 0.35 12/11/2016 1117   GFRNONAA >60 01/15/2023 0510   GFRNONAA 47 (L) 11/23/2022 1339   GFRAA >60 12/14/2019 1219   GFRAA >60 06/08/2019 1124   Lipase     Component Value Date/Time   LIPASE 21 01/12/2023 2153       Studies/Results: DG Abd 2 Views  Result Date: 01/14/2023 CLINICAL DATA:  [id number] SBO (small bowel obstruction) (HCC) [id number] EXAM: ABDOMEN - 2 VIEW COMPARISON:  CT abdomen pelvis 01/12/2023 FINDINGS: Bowel surgical staples overlie the left upper abdomen with persistent focal dilatation of a short loop of small bowel in this region. Left mid abdomen bowel surgical staples noted. Right upper quadrant surgical clips. Otherwise the remaining bowel gas pattern is normal. Stool within the rectosigmoid colon. There is no evidence of free air. No radio-opaque calculi or other significant radiographic abnormality is seen. IMPRESSION: Persistent focal dilatation of a short loop of small bowel within the left upper quadrant. Electronically Signed   By: Tish Frederickson M.D.   On: 01/14/2023 13:04    Anti-infectives: Anti-infectives (From admission, onward)    Start     Dose/Rate Route Frequency Ordered Stop  01/13/23 1015  cefTRIAXone (ROCEPHIN) 1 g in sodium chloride 0.9 % 100 mL IVPB  Status:  Discontinued        1 g 200 mL/hr over 30 Minutes Intravenous Every 24 hours 01/13/23 0920 01/13/23 0927   01/13/23 1015  metroNIDAZOLE (FLAGYL) IVPB 500 mg        500 mg 100 mL/hr over 60 Minutes Intravenous Every 12 hours 01/13/23 0920     01/13/23 1015  cefTRIAXone (ROCEPHIN) 2 g in sodium chloride 0.9 % 100 mL IVPB        2 g 200 mL/hr over 30 Minutes Intravenous Every 24 hours 01/13/23 1610          Assessment/Plan Abdominal pain, unclear etiology  -moving her bowels.  Tolerating CLD, but doesn't eat much at baseline. -drinking Ensure as well -adv to carb mod diet -no indications for surgical intervention. Suspect dilatation noted on imaging is the  reservoir for her food after total gastrectomy at her EJ anastomosis. -d/w primary service. -we will be available as needed  FEN - carb mod VTE - SCDs ID - rocephin for UTI  I reviewed hospitalist notes, last 24 h vitals and pain scores, last 48 h intake and output, last 24 h labs and trends, and last 24 h imaging results.   LOS: 2 days    Letha Cape , [facility] 01/15/2023, 9:34 AM Please see Amion for pager number during day hours 7:00am-4:30pm or 7:00am -11:30am on weekends

## 2023-01-15 NOTE — Plan of Care (Signed)
Problem: Coping: Goal: Ability to adjust to condition or change in health will improve Outcome: Progressing   Problem: Metabolic: Goal: Ability to maintain appropriate glucose levels will improve Outcome: Progressing   Problem: Nutritional: Goal: Maintenance of adequate nutrition will improve Outcome: Progressing   Problem: Skin Integrity: Goal: Risk for impaired skin integrity will decrease Outcome: Progressing

## 2023-01-15 NOTE — Progress Notes (Signed)
[facility] PROGRESS NOTE   Emily [name] XBJ:[id number] DOB: 1941-02-15 DOA: 01/12/2023  PCP: Georganna Skeans, MD  Brief History/Interval Summary:  82 y.o. 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  Imaging studies suggested early small bowel obstruction.  There was also concern for colitis.  Patient was hospitalized for further management.  Consultants: General surgery  Procedures: None yet    Subjective/Interval History: Patient complains of pain in the right first toe.  Denies any chest pain shortness of breath palpitations.  Abdominal pain is better but still persist.     Assessment/Plan:  Abdominal pain with concern raised for partial small bowel obstruction/possible colitis CT scan also raised concern for colitis.  No diarrhea was reported. Patient was empirically placed on antibiotics. X-ray was repeated yesterday and showed persistent prominent small bowel loop.  General surgery saw the patient yesterday afternoon and felt that the finding was likely reflective of an sequelae of her previous surgery.  She has had a total gastrectomy.  Has not had any nausea vomiting.  Continues to have abdominal tenderness.  Otherwise she appears to be stable.  Currently on clear liquid diet.  Continue with antibiotics for now.  Sinus tachycardia Noted to be tachycardic this morning.  EKG was done which shows sinus tachycardia.  Patient asymptomatic.  Could be secondary to pain issues.  Continue to monitor on telemetry for now.  Right toe pain No obvious swelling noted.  Mild erythema is present but no warmth.  Will get x-rays of her foot.  No history of gout.  Hypokalemia Potassium was 4.8 yesterday and noted to be 2.5  today.  Yesterday sample might have been hemolyzed.  Will give her potassium supplement this morning and recheck potassium level later today.  Abnormal UA/Possible UTI Due to her dementia unable to accurately assess if she is truly having symptoms suggestive of UTI or not.  WBC is noted to be normal.   Continue with antibiotics for now.  Essential hypertension Metoprolol was added.  Blood pressure is now reasonably well-controlled.  Continue with the losartan and metoprolol.  Diabetes mellitus type 2/hypoglycemia HbA1c was 5.3 in May.  Continues to have low glucose levels at times likely due to poor oral intake.  Continue with IV fluids for today.  Monitor CBGs.  Avoid insulin.  History of dementia Delirium precautions.  Stage II sacral decubitus Pressure Injury 01/13/23 Sacrum Medial Stage 2 -  Partial thickness loss of dermis presenting as a shallow open injury with a red, pink wound bed without slough. (Active)  01/13/23 0359  Location: Sacrum  Location Orientation: Medial  Staging: Stage 2 -  Partial thickness loss of dermis presenting as a shallow open injury with a red, pink wound bed without slough.  Wound Description (Comments):   Present on Admission: Yes    DVT Prophylaxis: SCDs for now Code Status: Full code Family Communication: Daughter being updated on a daily basis. Disposition Plan: Home with home health when medically ready      Medications: Scheduled:  dextrose       insulin aspart  0-6 Units Subcutaneous Q4H   losartan  25 mg Oral Daily   metoprolol tartrate  12.5 mg Oral BID   polyethylene glycol  17 g Oral Daily   sodium chloride flush  10-40 mL Intracatheter Q12H  Continuous:  cefTRIAXone (ROCEPHIN)  IV 2 g (01/14/23 1150)   dextrose 5 % and 0.9 % NaCl 75 mL/hr at 01/15/23 0910   metronidazole 500 mg (01/15/23 0911)   ZOX:[id number] **OR** acetaminophen, dextrose, fentaNYL (SUBLIMAZE) injection, melatonin, prochlorperazine, sodium chloride  flush  Antibiotics: Anti-infectives (From admission, onward)    Start     Dose/Rate Route Frequency Ordered Stop   01/13/23 1015  cefTRIAXone (ROCEPHIN) 1 g in sodium chloride 0.9 % 100 mL IVPB  Status:  Discontinued        1 g 200 mL/hr over 30 Minutes Intravenous Every 24 hours 01/13/23 0920 01/13/23 0927   01/13/23 1015  metroNIDAZOLE (FLAGYL) IVPB 500 mg        500 mg 100 mL/hr over 60 Minutes Intravenous Every 12 hours 01/13/23 0920     01/13/23 1015  cefTRIAXone (ROCEPHIN) 2 g in sodium chloride 0.9 % 100 mL IVPB        2 g 200 mL/hr over 30 Minutes Intravenous Every 24 hours 01/13/23 0927         Objective:  Vital Signs  Vitals:   01/14/23 1527 01/14/23 1600 01/14/23 2008 01/15/23 0615  BP: 126/77  136/73 (!) 140/76  Pulse:   85 (!) 136  Resp:   16 17  Temp:   97.9 F (36.6 C) 97.8 F (36.6 C)  TempSrc:   Oral Oral  SpO2: 98% 95% 97% 99%  Weight:      Height:        Intake/Output Summary (Last 24 hours) at 01/15/2023 0927 Last data filed at 01/14/2023 1700 Gross per 24 hour  Intake 480 ml  Output 400 ml  Net 80 ml   Filed Weights   01/13/23 0359 01/14/23 0500  Weight: 32.7 kg 37.3 kg    General appearance: Awake alert.  In no distress.  Distracted Resp: Clear to auscultation bilaterally.  Normal effort Cardio: S1-S2 is normal regular.  No S3-S4.  No rubs murmurs or bruit GI: Abdomen is soft.  Mildly tender diffusely without any rebound rigidity or guarding.  No masses organomegaly. Extremities: Good pedal pulses in the right foot.  Mild erythema around the first toe but without any warmth.  No obvious swelling noted.    Lab Results:  Data Reviewed: I have personally reviewed following labs and reports of the imaging studies  CBC: Recent Labs  Lab 01/12/23 2027 01/13/23 0507 01/15/23 0510  WBC 9.7 7.5 8.1  NEUTROABS 6.9  --   --   HGB 12.0 12.6 12.7  HCT 37.4 38.6 37.7  MCV 73.2* 72.1* 71.7*  PLT 206 127* 199    Basic Metabolic  Panel: Recent Labs  Lab 01/12/23 2153 01/13/23 0507 01/14/23 0242 01/15/23 0510  NA 140 139 136 136  K 4.0 4.5 4.8 2.5*  CL 107 107 103 107  CO2 23 19* 18* 22  GLUCOSE 94 83 59* 63*  BUN 20 17 11 8   CREATININE 1.03* 1.03* 0.90 0.82  CALCIUM 8.0* 8.3* 8.0* 7.9*  MG  --  1.8  --   --     GFR: Estimated Creatinine Clearance: 31.1 mL/min (by C-G formula based on SCr of 0.82 mg/dL).  Liver Function Tests: Recent Labs  Lab 01/12/23 2153  AST 25  ALT 15  ALKPHOS 82  BILITOT 0.8  PROT 5.2*  ALBUMIN 2.0*    Recent Labs  Lab 01/12/23 2153  LIPASE 21     CBG: Recent Labs  Lab 01/14/23 1539 01/14/23 2005 01/15/23  0018 01/15/23 0651 01/15/23 0713  GLUCAP 85 212* 158* 37* 86    Radiology Studies: DG Abd 2 Views  Result Date: 01/14/2023 CLINICAL DATA:  [id number] SBO (small bowel obstruction) (HCC) [id number] EXAM: ABDOMEN - 2 VIEW COMPARISON:  CT abdomen pelvis 01/12/2023 FINDINGS: Bowel surgical staples overlie the left upper abdomen with persistent focal dilatation of a short loop of small bowel in this region. Left mid abdomen bowel surgical staples noted. Right upper quadrant surgical clips. Otherwise the remaining bowel gas pattern is normal. Stool within the rectosigmoid colon. There is no evidence of free air. No radio-opaque calculi or other significant radiographic abnormality is seen. IMPRESSION: Persistent focal dilatation of a short loop of small bowel within the left upper quadrant. Electronically Signed   By: Tish Frederickson M.D.   On: 01/14/2023 13:04       LOS: 2 days   Emily [name] Rito Ehrlich  [facility] Pager on [email or web address]  01/15/2023, 9:27 AM

## 2023-01-16 DIAGNOSIS — K529 Noninfective gastroenteritis and colitis, unspecified: Secondary | ICD-10-CM | POA: Diagnosis not present

## 2023-01-16 DIAGNOSIS — I10 Essential (primary) hypertension: Secondary | ICD-10-CM | POA: Diagnosis not present

## 2023-01-16 DIAGNOSIS — D649 Anemia, unspecified: Secondary | ICD-10-CM

## 2023-01-16 LAB — CBC
HCT: 28.8 % — ABNORMAL LOW (ref 36.0–46.0)
HCT: 34.4 % — ABNORMAL LOW (ref 36.0–46.0)
Hemoglobin: 9.6 g/dL — ABNORMAL LOW (ref 12.0–15.0)
Hemoglobin: 11.1 g/dL — ABNORMAL LOW (ref 12.0–15.0)
MCH: 24.2 pg — ABNORMAL LOW (ref 26.0–34.0)
MCH: 23.5 pg — ABNORMAL LOW (ref 26.0–34.0)
MCHC: 33.3 g/dL (ref 30.0–36.0)
MCHC: 32.3 g/dL (ref 30.0–36.0)
MCV: 72.5 fL — ABNORMAL LOW (ref 80.0–100.0)
MCV: 72.9 fL — ABNORMAL LOW (ref 80.0–100.0)
Platelets: 175 10*3/uL (ref 150–400)
Platelets: 185 10*3/uL (ref 150–400)
RBC: 3.97 MIL/uL (ref 3.87–5.11)
RBC: 4.72 MIL/uL (ref 3.87–5.11)
RDW: 15.7 % — ABNORMAL HIGH (ref 11.5–15.5)
RDW: 15.9 % — ABNORMAL HIGH (ref 11.5–15.5)
WBC: 7.6 10*3/uL (ref 4.0–10.5)
WBC: 6.9 10*3/uL (ref 4.0–10.5)
nRBC: 0 % (ref 0.0–0.2)
nRBC: 0 % (ref 0.0–0.2)

## 2023-01-16 LAB — BASIC METABOLIC PANEL
Anion gap: 6 (ref 5–15)
BUN: 5 mg/dL — ABNORMAL LOW (ref 8–23)
CO2: 22 mmol/L (ref 22–32)
Calcium: 7.4 mg/dL — ABNORMAL LOW (ref 8.9–10.3)
Chloride: 109 mmol/L (ref 98–111)
Creatinine, Ser: 0.79 mg/dL (ref 0.44–1.00)
GFR, Estimated: >60 mL/min (ref >60)
Glucose, Bld: 107 mg/dL — ABNORMAL HIGH (ref 70–99)
Potassium: 4.3 mmol/L (ref 3.5–5.1)
Sodium: 137 mmol/L (ref 135–145)

## 2023-01-16 LAB — GLUCOSE, CAPILLARY
Glucose-Capillary: 104 mg/dL — ABNORMAL HIGH (ref 70–99)
Glucose-Capillary: 107 mg/dL — ABNORMAL HIGH (ref 70–99)
Glucose-Capillary: 122 mg/dL — ABNORMAL HIGH (ref 70–99)
Glucose-Capillary: 149 mg/dL — ABNORMAL HIGH (ref 70–99)
Glucose-Capillary: 190 mg/dL — ABNORMAL HIGH (ref 70–99)
Glucose-Capillary: 25 mg/dL — CL (ref 70–99)
Glucose-Capillary: 38 mg/dL — CL (ref 70–99)
Glucose-Capillary: 46 mg/dL — ABNORMAL LOW (ref 70–99)
Glucose-Capillary: 75 mg/dL (ref 70–99)

## 2023-01-16 LAB — CORTISOL: Cortisol, Plasma: 21.7 ug/dL

## 2023-01-16 LAB — MAGNESIUM: Magnesium: 2.5 mg/dL — ABNORMAL HIGH (ref 1.7–2.4)

## 2023-01-16 MED ORDER — DEXTROSE 50 % IV SOLN
25.0000 g | INTRAVENOUS | Status: AC
  Filled 2023-01-16 (×2): qty 50

## 2023-01-16 MED ORDER — DEXTROSE-SODIUM CHLORIDE 5-0.9 % IV SOLN: INTRAVENOUS | Status: AC

## 2023-01-16 MED ORDER — GLUCOSE 40 % PO GEL
1.0000 | Freq: Once | ORAL | Status: AC | PRN
  Administered 2023-01-16: 31 g via ORAL
  Filled 2023-01-16: qty 1.21

## 2023-01-16 MED ORDER — ENSURE ENLIVE PO LIQD
237.0000 mL | Freq: Three times a day (TID) | ORAL | Status: DC
  Administered 2023-01-16 – 2023-01-17 (×2): 237 mL via ORAL

## 2023-01-16 MED ORDER — AMOXICILLIN-POT CLAVULANATE 875-125 MG PO TABS
1.0000 | ORAL_TABLET | Freq: Two times a day (BID) | ORAL | Status: DC
  Administered 2023-01-16 – 2023-01-17 (×3): 1 via ORAL
  Filled 2023-01-16 (×3): qty 1

## 2023-01-16 MED ORDER — GLUCAGON HCL RDNA (DIAGNOSTIC) 1 MG IJ SOLR: 1.0000 mg | Freq: Once | INTRAMUSCULAR | Status: DC | PRN

## 2023-01-16 NOTE — Plan of Care (Signed)

## 2023-01-16 NOTE — Progress Notes (Signed)
Pt blood sugar is 46, gave her orange juice and started the low gylcemic protocol. Notified charge and MD of the pt change. Pt is alert and orient, pleasant and cooperative. Gave pt pudding, breakfast arrived, pt only ate half of french toast, cranberry juice, sips of milk.   Recheck blood sugar, BS is 38, unable to push D50 due to pt IV been access by the IV team but pt did receive the glucose gel, see MAR. Pt place on D5 once IV was place  Pt LOC is normal.  This nurse has rounded on pt she is resting comfortable in bed, call button in place, plan of care ongoing.

## 2023-01-16 NOTE — Progress Notes (Addendum)
[facility] PROGRESS NOTE   Emily [name] RUE:[id number] DOB: [birth year] DOA: 01/12/2023  PCP: Georganna Skeans, MD  Brief History/Interval Summary:  82 y.o. 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  Imaging studies suggested early small bowel obstruction.  There was also concern for colitis.  Patient was hospitalized for further management.  Consultants: General surgery  Procedures: None yet    Subjective/Interval History: Patient mentions that the pain in the right toe is much better.  Denies any nausea vomiting.     Assessment/Plan:  Abdominal pain with concern raised for partial small bowel obstruction/possible colitis CT scan also raised concern for colitis.  No diarrhea was reported. Patient was empirically placed on antibiotics. X-ray was repeated and showed persistent prominent small bowel loop.  General surgery saw the patient and felt that the finding was likely reflective of an sequelae of her previous surgery.  She has had a total gastrectomy.  Has not had any nausea vomiting.   Patient started on bowel regimen with multiple bowel movements yesterday.  Feels better this morning.  Tolerating her diet.  General surgery has signed off. Change antibiotics to oral.    Microcytic anemia An acute drop in hemoglobin noted from 12.7-9.6.  No overt bleeding has been noted.  Will repeat CBC.  Sinus tachycardia Tachycardia was likely due to pain.  Appears to have resolved.  Continue to monitor for additional 24 hours.    Right toe pain No obvious swelling noted.  No abnormalities on x-ray.  Patient states that she hit her foot against a wall few weeks ago.  Pain is better this  morning.  Hypokalemia/hypomagnesemia Potassium was aggressively repleted and has improved.  Magnesium is also improved.    Abnormal UA/Possible UTI Due to her dementia unable to accurately assess if she is truly having symptoms suggestive of UTI or not.  Empirically treated with antibiotics.  Essential hypertension Metoprolol was added.  Blood pressure is now reasonably well-controlled.  Continue with the losartan and metoprolol.  Diabetes mellitus type 2/hypoglycemia HbA1c was 5.3 in May.  Saline has been discontinued.  Continues to have low glucose levels.  Continue IV fluids for now.  Likely due to poor oral intake.  But we will check cortisol level.    History of dementia Delirium precautions.  Stage II sacral decubitus Pressure Injury 01/13/23 Sacrum Medial Stage 2 -  Partial thickness loss of dermis presenting as a shallow open injury with a red, pink wound bed without slough. (Active)  01/13/23 0359  Location: Sacrum  Location Orientation: Medial  Staging: Stage 2 -  Partial thickness loss of dermis presenting as a shallow open injury with a red, pink wound bed without slough.  Wound Description (Comments):   Present on Admission: Yes    DVT Prophylaxis: SCDs for now Code Status: Full code Family Communication: Daughter being updated on a daily basis. Disposition Plan: Home with home health when medically ready      Medications: Scheduled:  amoxicillin-clavulanate  1 tablet Oral Q12H   dextrose  25 g Intravenous STAT   losartan  25 mg Oral Daily   metoprolol tartrate  12.5 mg Oral BID   sodium chloride flush  10-40 mL Intracatheter Q12H   Continuous:  dextrose 5 % and 0.9 % NaCl     JYN:[id number] **OR** acetaminophen,  fentaNYL (SUBLIMAZE) injection, melatonin, polyethylene glycol, prochlorperazine, sodium chloride flush  Antibiotics: Anti-infectives (From admission, onward)    Start     Dose/Rate Route Frequency Ordered Stop   01/16/23 1015   amoxicillin-clavulanate (AUGMENTIN) 875-125 MG per tablet 1 tablet        1 tablet Oral Every 12 hours 01/16/23 0916     01/13/23 1015  cefTRIAXone (ROCEPHIN) 1 g in sodium chloride 0.9 % 100 mL IVPB  Status:  Discontinued        1 g 200 mL/hr over 30 Minutes Intravenous Every 24 hours 01/13/23 0920 01/13/23 0927   01/13/23 1015  metroNIDAZOLE (FLAGYL) IVPB 500 mg  Status:  Discontinued        500 mg 100 mL/hr over 60 Minutes Intravenous Every 12 hours 01/13/23 0920 01/16/23 0916   01/13/23 1015  cefTRIAXone (ROCEPHIN) 2 g in sodium chloride 0.9 % 100 mL IVPB  Status:  Discontinued        2 g 200 mL/hr over 30 Minutes Intravenous Every 24 hours 01/13/23 0927 01/16/23 0916       Objective:  Vital Signs  Vitals:   01/15/23 2102 01/16/23 0021 01/16/23 0453 01/16/23 0818  BP: (!) 153/71 138/62 120/68 (!) 143/71  Pulse:  96 (!) 58 93  Resp: 19 19 18 17   Temp: (!) 97.5 F (36.4 C) 98 F (36.7 C) (!) 97.5 F (36.4 C) 97.8 F (36.6 C)  TempSrc: Oral Oral Oral Oral  SpO2: 93% 93% 100% 95%  Weight:      Height:        Intake/Output Summary (Last 24 hours) at 01/16/2023 0916 Last data filed at 01/16/2023 0818 Gross per 24 hour  Intake 1018.05 ml  Output 503 ml  Net 515.05 ml   Filed Weights   01/13/23 0359 01/14/23 0500  Weight: 32.7 kg 37.3 kg    General appearance: Awake alert.  In no distress Resp: Clear to auscultation bilaterally.  Normal effort Cardio: S1-S2 is normal regular.  No S3-S4.  No rubs murmurs or bruit GI: Abdomen is soft.  Nontender nondistended.  Bowel sounds are present normal.  No masses organomegaly Extremities: No edema.  Full range of motion of lower extremities.     Lab Results:  Data Reviewed: I have personally reviewed following labs and reports of the imaging studies  CBC: Recent Labs  Lab 01/12/23 2027 01/13/23 0507 01/15/23 0510 01/16/23 0154  WBC 9.7 7.5 8.1 7.6  NEUTROABS 6.9  --   --   --   HGB 12.0 12.6 12.7 9.6*  HCT 37.4  38.6 37.7 28.8*  MCV 73.2* 72.1* 71.7* 72.5*  PLT 206 127* 199 175    Basic Metabolic Panel: Recent Labs  Lab 01/12/23 2153 01/13/23 0507 01/14/23 0242 01/15/23 0510 01/15/23 1206 01/16/23 0154  NA 140 139 136 136  --  137  K 4.0 4.5 4.8 2.5* 3.1* 4.3  CL 107 107 103 107  --  109  CO2 23 19* 18* 22  --  22  GLUCOSE 94 83 59* 63*  --  107*  BUN 20 17 11 8   --  5*  CREATININE 1.03* 1.03* 0.90 0.82  --  0.79  CALCIUM 8.0* 8.3* 8.0* 7.9*  --  7.4*  MG  --  1.8  --   --  1.4* 2.5*    GFR: Estimated Creatinine Clearance: 31.9 mL/min (by C-G formula based on SCr of 0.79 mg/dL).  Liver Function Tests: Recent Labs  Lab 01/12/23 2153  AST 25  ALT 15  ALKPHOS 82  BILITOT 0.8  PROT 5.2*  ALBUMIN 2.0*    Recent Labs  Lab 01/12/23 2153  LIPASE 21     CBG: Recent Labs  Lab 01/15/23 2049 01/16/23 0020 01/16/23 0450 01/16/23 0807 01/16/23 0849  GLUCAP 211* 190* 75 46* 38*    Radiology Studies: DG Foot 2 Views Right  Result Date: 01/15/2023 CLINICAL DATA:  Toe pain EXAM: RIGHT FOOT - 2 VIEW COMPARISON:  Foot radiographs 10/26/2022 FINDINGS: There is no acute fracture or dislocation. Bony alignment is normal. The joint spaces are overall preserved. There is no osseous erosion or destruction. There is no soft tissue gas or radiopaque foreign body. IMPRESSION: No acute finding in the foot.  No osseous erosion or destruction. Electronically Signed   By: Lesia Hausen M.D.   On: 01/15/2023 14:28       LOS: 3 days   Campbell Agramonte Rito Ehrlich  [facility] Pager on [email or web address]  01/16/2023, 9:16 AM

## 2023-01-17 DIAGNOSIS — K529 Noninfective gastroenteritis and colitis, unspecified: Secondary | ICD-10-CM | POA: Diagnosis not present

## 2023-01-17 LAB — CBC
HCT: 31.8 % — ABNORMAL LOW (ref 36.0–46.0)
Hemoglobin: 10 g/dL — ABNORMAL LOW (ref 12.0–15.0)
MCH: 23.7 pg — ABNORMAL LOW (ref 26.0–34.0)
MCHC: 31.4 g/dL (ref 30.0–36.0)
MCV: 75.4 fL — ABNORMAL LOW (ref 80.0–100.0)
Platelets: 153 10*3/uL (ref 150–400)
RBC: 4.22 MIL/uL (ref 3.87–5.11)
RDW: 16.8 % — ABNORMAL HIGH (ref 11.5–15.5)
WBC: 5.3 10*3/uL (ref 4.0–10.5)
nRBC: 0 % (ref 0.0–0.2)

## 2023-01-17 LAB — BASIC METABOLIC PANEL
Anion gap: 6 (ref 5–15)
BUN: 6 mg/dL — ABNORMAL LOW (ref 8–23)
CO2: 14 mmol/L — ABNORMAL LOW (ref 22–32)
Calcium: 7.3 mg/dL — ABNORMAL LOW (ref 8.9–10.3)
Chloride: 113 mmol/L — ABNORMAL HIGH (ref 98–111)
Creatinine, Ser: 0.79 mg/dL (ref 0.44–1.00)
GFR, Estimated: >60 mL/min (ref >60)
Glucose, Bld: 82 mg/dL (ref 70–99)
Potassium: 5 mmol/L (ref 3.5–5.1)
Sodium: 133 mmol/L — ABNORMAL LOW (ref 135–145)

## 2023-01-17 LAB — GLUCOSE, CAPILLARY
Glucose-Capillary: 116 mg/dL — ABNORMAL HIGH (ref 70–99)
Glucose-Capillary: 44 mg/dL — CL (ref 70–99)
Glucose-Capillary: 72 mg/dL (ref 70–99)
Glucose-Capillary: 81 mg/dL (ref 70–99)
Glucose-Capillary: 84 mg/dL (ref 70–99)

## 2023-01-17 MED ORDER — POLYETHYLENE GLYCOL 3350 17 G PO PACK: 17.0000 g | PACK | Freq: Every day | ORAL | 0 refills | Status: AC | PRN

## 2023-01-17 MED ORDER — LANCET DEVICE MISC
1.0000 | Freq: Every morning | 0 refills | Status: AC
Start: 2023-01-17 — End: 2023-02-16

## 2023-01-17 MED ORDER — DEXTROSE 50 % IV SOLN: 25.0000 g | INTRAVENOUS | Status: DC

## 2023-01-17 MED ORDER — SACCHAROMYCES BOULARDII 250 MG PO CAPS: 250.0000 mg | ORAL_CAPSULE | Freq: Two times a day (BID) | ORAL | 0 refills | Status: AC

## 2023-01-17 MED ORDER — METOPROLOL TARTRATE 25 MG PO TABS: 12.5000 mg | ORAL_TABLET | Freq: Two times a day (BID) | ORAL | 1 refills | Status: AC

## 2023-01-17 MED ORDER — FUROSEMIDE 20 MG PO TABS: ORAL_TABLET | ORAL | 0 refills | Status: AC

## 2023-01-17 MED ORDER — BLOOD GLUCOSE MONITORING SUPPL DEVI: 1.0000 | Freq: Every morning | 0 refills | Status: AC

## 2023-01-17 MED ORDER — BLOOD GLUCOSE TEST VI STRP
1.0000 | ORAL_STRIP | Freq: Every morning | 0 refills | Status: AC
Start: 2023-01-17 — End: 2023-04-27

## 2023-01-17 MED ORDER — LANCETS MISC. MISC
1.0000 | Freq: Every morning | 0 refills | Status: AC
Start: 2023-01-17 — End: 2023-02-16

## 2023-01-17 MED ORDER — AMOXICILLIN-POT CLAVULANATE 875-125 MG PO TABS: 1.0000 | ORAL_TABLET | Freq: Two times a day (BID) | ORAL | 0 refills | Status: AC

## 2023-01-17 NOTE — TOC Transition Note (Signed)
Transition of Care [facility]) - CM/SW Discharge Note   Patient Details  Name: Emily [name] MRN: [id number] Date of Birth: 08/29/1940  [facility] CM/SW Contact:  Ronny Bacon, RN Phone Number: 01/17/2023, 9:14 AM   Clinical Narrative: Patient is being discharged, Amy- [facility] made aware.      Final next level of care: Home w Home Health Services Barriers to Discharge: No Barriers Identified   Patient Goals and CMS Choice CMS Medicare.gov Compare Post Acute Care list provided to:: Patient Choice offered to / list presented to : Patient  Discharge Placement                         Discharge Plan and Services Additional resources added to the After Visit Summary for       Post Acute Care Choice: Home Health          DME Arranged:  (transport chair) DME Agency: [employer] Date DME Agency Contacted: 01/15/23 Time DME Agency Contacted: [phone] Representative spoke with at DME Agency: Mitch HH Arranged: PT, OT HH Agency: [facility] Date [facility] Agency Contacted: 01/15/23 Time HH Agency Contacted: 1522 Representative spoke with at [facility] Agency: Amy  Social Determinants of Health (SDOH) Interventions SDOH Screenings   Food Insecurity: Low Risk  (07/24/2022)   Received from [facility], [facility]  Transportation Needs: No Transportation Needs (07/24/2022)   [facility]  Utilities: Low Risk  (07/24/2022)   [facility]  Depression (PHQ2-9): Low Risk  (01/06/2023)  Tobacco Use: High Risk (01/13/2023)     Readmission Risk Interventions     No data to display

## 2023-01-17 NOTE — Progress Notes (Signed)
Emily [name] to be D/C'd  per MD order.  Discussed with the patient and daughter [name] all questions fully answered.  VSS, Skin clean, dry and intact without evidence of skin break down, no evidence of skin tears noted.  IV catheter discontinued intact. Site without signs and symptoms of complications. Dressing and pressure applied.  An After Visit Summary was printed and given to the patient.   D/c education completed with patient/family including follow up instructions, medication list, d/c activities limitations if indicated, with other d/c instructions as indicated by MD - patient able to verbalize understanding, all questions fully answered.   Patient instructed to return to ED, call 911, or call MD for any changes in condition.   Patient to be escorted via WC, and D/C home via private auto.

## 2023-01-17 NOTE — Discharge Summary (Signed)
[facility]  Physician Discharge Summary   Patient ID: Emily [name] MRN: [id number] DOB/AGE: 82-Apr-1942 82 y.o.  Admit date: 01/12/2023 Discharge date: 01/17/2023    PCP: Georganna Skeans, MD  DISCHARGE DIAGNOSES:  Colitis   DM2 (diabetes mellitus, type 2) (HCC)   Hypertension   Pressure injury of skin   RECOMMENDATIONS FOR OUTPATIENT FOLLOW UP: Patient instructed to check her glucose levels every morning.  This was also communicated to patient's daughter. Patient instructed to eat a late night snack.    Home Health: PT OT Equipment/Devices: None  CODE STATUS: Full code  DISCHARGE CONDITION: fair  Diet recommendation: Regular as tolerated  INITIAL HISTORY: 82 y.o. 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  Imaging studies suggested early small bowel obstruction.  There was also concern for colitis.  Patient was hospitalized for further management.   Consultants: General surgery   Procedures: None  HOSPITAL COURSE:   Acute Colitis CT scan raised concern for colitis.  Patient was empirically placed on antibiotics. X-ray was repeated and showed persistent prominent small bowel loop.  General surgery saw the patient and felt that the finding was likely reflective of an sequelae of her previous surgery and not SBO. Has not had any nausea vomiting.   Patient started on bowel regimen with multiple bowel movements.  General surgery has signed off. Changed to oral antibiotics.   Microcytic anemia An acute drop in hemoglobin noted from 12.7 to 9.6.  No overt bleeding has been noted. Hemoglobin is stable.   Sinus tachycardia Tachycardia was likely due to pain.  Appears to have resolved.    Right toe  pain No obvious swelling noted.  No abnormalities on x-ray.  Patient states that she hit her foot against a wall few weeks ago.  Pain is better this morning.   Hypokalemia/hypomagnesemia Potassium was aggressively repleted and has improved.  Magnesium is also improved.     Abnormal UA/Possible UTI Due to her dementia unable to accurately assess if she is truly having symptoms suggestive of UTI or not.  Empirically treated with antibiotics.   Essential hypertension Metoprolol was added.  Blood pressure is now reasonably well-controlled.  Continue with the losartan and metoprolol.   Diabetes mellitus type 2/hypoglycemia HbA1c was 5.3 in May.  Continues to have low glucose levels. Likely due to poor oral intake.  Cortisol level was normal. Patient and daughter told that patient will need to have a late night snack and check glucose levels every morning.    History of dementia Delirium precautions.   Stage II sacral decubitus Pressure Injury 01/13/23 Sacrum Medial Stage 2 -  Partial thickness loss of dermis presenting as a shallow open injury with a red, pink wound bed without slough. (Active)  01/13/23 0359  Location: Sacrum  Location Orientation: Medial  Staging: Stage 2 -  Partial thickness loss of dermis presenting as a shallow open injury with a red, pink wound bed without slough.  Wound Description (Comments):   Present on Admission: Yes    Ok for discharge today.   PERTINENT LABS:  The results of significant diagnostics from this hospitalization (including imaging, microbiology, ancillary and laboratory) are listed below for reference.    Microbiology: Recent Results (from the past 240 hour(s))  Urine Culture     Status: Abnormal   Collection Time:  01/13/23 12:42 AM   Specimen: Urine, Random  Result Value Ref Range Status   Specimen Description URINE, RANDOM  Final   Special Requests   Final    NONE Reflexed from [id number] Performed at [facility], [address], [place], [place] [address]    Culture MULTIPLE SPECIES PRESENT, SUGGEST RECOLLECTION (A)  Final   Report Status 01/14/2023 FINAL  Final     Labs:   Basic Metabolic Panel: Recent Labs  Lab 01/13/23 0507 01/14/23 0242 01/15/23 0510 01/15/23 1206 01/16/23 0154 01/17/23 0235  NA 139 136 136  --  137 133*  K 4.5 4.8 2.5* 3.1* 4.3 5.0  CL 107 103 107  --  109 113*  CO2 19* 18* 22  --  22 14*  GLUCOSE 83 59* 63*  --  107* 82  BUN 17 11 8   --  5* 6*  CREATININE 1.03* 0.90 0.82  --  0.79 0.79  CALCIUM 8.3* 8.0* 7.9*  --  7.4* 7.3*  MG 1.8  --   --  1.4* 2.5*  --    Liver Function Tests: Recent Labs  Lab 01/12/23 2153  AST 25  ALT 15  ALKPHOS 82  BILITOT 0.8  PROT 5.2*  ALBUMIN 2.0*   Recent Labs  Lab 01/12/23 2153  LIPASE 21    CBC: Recent Labs  Lab 01/12/23 2027 01/13/23 0507 01/15/23 0510 01/16/23 0154 01/16/23 1208 01/17/23 0235  WBC 9.7 7.5 8.1 7.6 6.9 5.3  NEUTROABS 6.9  --   --   --   --   --   HGB 12.0 12.6 12.7 9.6* 11.1* 10.0*  HCT 37.4 38.6 37.7 28.8* 34.4* 31.8*  MCV 73.2* 72.1* 71.7* 72.5* 72.9* 75.4*  PLT 206 127* 199 175 185 153    CBG: Recent Labs  Lab 01/17/23 0006 01/17/23 0407 01/17/23 0705 01/17/23 0724 01/17/23 0906  GLUCAP 116* 84 44* 81 72     IMAGING STUDIES DG Foot 2 Views Right  Result Date: 01/15/2023 CLINICAL DATA:  Toe pain EXAM: RIGHT FOOT - 2 VIEW COMPARISON:  Foot radiographs 10/26/2022 FINDINGS: There is no acute fracture or dislocation. Bony alignment is normal. The joint spaces are overall preserved. There is no osseous erosion or destruction. There is no soft tissue gas or radiopaque foreign body. IMPRESSION: No acute finding in the foot.  No osseous erosion or destruction. Electronically Signed   By: Lesia Hausen M.D.   On: 01/15/2023 14:28   DG Abd 2 Views  Result Date: 01/14/2023 CLINICAL DATA:  [id number] SBO (small bowel obstruction) (HCC) [id number] EXAM: ABDOMEN - 2 VIEW COMPARISON:  CT abdomen pelvis 01/12/2023  FINDINGS: Bowel surgical staples overlie the left upper abdomen with persistent focal dilatation of a short loop of small bowel in this region. Left mid abdomen bowel surgical staples noted. Right upper quadrant surgical clips. Otherwise the remaining bowel gas pattern is normal. Stool within the rectosigmoid colon. There is no evidence of free air. No radio-opaque calculi or other significant radiographic abnormality is seen. IMPRESSION: Persistent focal dilatation of a short loop of small bowel within the left upper quadrant. Electronically Signed   By: Tish Frederickson M.D.   On: 01/14/2023 13:04   CT ABDOMEN PELVIS W CONTRAST  Result Date: 01/12/2023 CLINICAL DATA:  Abdominal pain, acute, nonlocalized. EXAM: CT ABDOMEN AND PELVIS WITH CONTRAST TECHNIQUE: Multidetector CT imaging of the abdomen and pelvis was performed using the standard protocol following bolus administration of intravenous contrast. RADIATION DOSE REDUCTION:  This exam was performed according to the departmental dose-optimization program which includes automated exposure control, adjustment of the mA and/or kV according to patient size and/or use of iterative reconstruction technique. CONTRAST:  60mL OMNIPAQUE IOHEXOL 350 MG/ML SOLN COMPARISON:  09/03/2022. FINDINGS: Lower chest: There is a small left pleural effusion with atelectasis at the left lung base. Hepatobiliary: No focal liver abnormality is seen. Status post cholecystectomy. The common bile duct is distended, similar in appearance to the prior exam. Pancreas: Pancreatic atrophy is noted. Evaluation is limited due to motion artifact. No pancreatic ductal dilatation or surrounding inflammatory changes. Spleen: Normal in size without focal abnormality. Adrenals/Urinary Tract: The adrenal glands are within normal limits. There is right renal atrophy with cortical thinning and a cyst. Subcentimeter hypodensities are present bilaterally which are too small to further characterize. A  nonobstructive calculus is present on the right. No hydronephrosis bilaterally. Bladder diverticula are present bilaterally. Stomach/Bowel: Gastric surgical changes are noted. Appendix is not seen. There is a prominent loop of jejunum in the left lower quadrant measuring up to 4.2 cm proximal to an anastomotic site. No definite associated bowel wall thickening or surrounding fat stranding, however examination is limited due to paucity of intra-abdominal fat and lack of oral contrast. There is bowel wall thickening involving the proximal descending colon. No free air or pneumatosis. Vascular/Lymphatic: Aortic atherosclerosis. No enlarged abdominal or pelvic lymph nodes. Reproductive: Uterus and bilateral adnexa are unremarkable. Other: Small amount of ascites is noted in the presacral space. There stable marked cachexia with body wall edema. Musculoskeletal: No acute fracture is seen. IMPRESSION: 1. Distended loop of small bowel in the mid left abdomen measuring 4.2 cm in diameter proximal to a anastomotic site, possible partial or early small bowel obstruction. 2. Thickening of the walls of the proximal descending colon, suggesting infectious or inflammatory colitis. 3. Diverticulosis without diverticulitis. 4. Nonobstructive right renal calculus. 5. Small left pleural effusion with atelectasis. 6. Mild ascites.  Persistent marked cachexia with body wall edema. 7. Aortic atherosclerosis. Electronically Signed   By: Thornell Sartorius M.D.   On: 01/12/2023 23:52    DISCHARGE EXAMINATION: Vitals:   01/16/23 2137 01/17/23 0411 01/17/23 0751 01/17/23 0930  BP: 122/60 (!) 150/58 138/62 127/64  Pulse: 63 (!) 55 62 61  Resp: 16 18 18    Temp: 98.2 F (36.8 C) 97.9 F (36.6 C) 97.6 F (36.4 C)   TempSrc: Oral Oral    SpO2: 100% 100% 100%   Weight:      Height:       General appearance: Awake alert.  In no distress Resp: Clear to auscultation bilaterally.  Normal effort Cardio: S1-S2 is normal regular.  No  S3-S4.  No rubs murmurs or bruit GI: Abdomen is soft.  Nontender nondistended.  Bowel sounds are present normal.  No masses organomegaly   DISPOSITION: Home  Discharge Instructions     Call MD for:  difficulty breathing, headache or visual disturbances   Complete by: As directed    Call MD for:  extreme fatigue   Complete by: As directed    Call MD for:  persistant dizziness or light-headedness   Complete by: As directed    Call MD for:  persistant nausea and vomiting   Complete by: As directed    Call MD for:  severe uncontrolled pain   Complete by: As directed    Call MD for:  temperature >100.4   Complete by: As directed    Diet general   Complete  by: As directed    Discharge instructions   Complete by: As directed    As instructed please eat late night snack every night before going to bed.  Check your glucose levels fasting in the morning as soon as you wake up.  Take your other medications as prescribed.  Follow-up with your primary care provider within 1 week after discharge.  You were cared for by a hospitalist during your hospital stay. If you have any questions about your discharge medications or the care you received while you were in the hospital after you are discharged, you can call the unit and asked to speak with the hospitalist on call if the hospitalist that took care of you is not available. Once you are discharged, your primary care physician will handle any further medical issues. Please note that NO REFILLS for any discharge medications will be authorized once you are discharged, as it is imperative that you return to your primary care physician (or establish a relationship with a primary care physician if you do not have one) for your aftercare needs so that they can reassess your need for medications and monitor your lab values. If you do not have a primary care physician, you can call [phone] for a physician referral.   Increase activity slowly   Complete by: As  directed    No wound care   Complete by: As directed          Allergies as of 01/17/2023   No Known Allergies      Medication List     TAKE these medications    ammonium lactate 12 % cream Commonly known as: Lac-Hydrin Apply 1 Application topically as needed for dry skin. What changed: when to take this   amoxicillin-clavulanate 875-125 MG tablet Commonly known as: AUGMENTIN Take 1 tablet by mouth every 12 (twelve) hours for 4 days.   B-1 PO Take 1 tablet by mouth daily.   B-12 COMPLIANCE INJECTION IJ Inject 1 Dose as directed every 14 (fourteen) days.   Blood Glucose Monitoring Suppl Devi 1 each by Does not apply route in the morning. May substitute to any manufacturer covered by patient's insurance.   BLOOD GLUCOSE TEST STRIPS Strp 1 each by In Vitro route every morning. May substitute to any manufacturer covered by patient's insurance.   dicyclomine 10 MG capsule Commonly known as: BENTYL TAKE 1 CAPSULE BY MOUTH EVERY 8 HOURS AS NEEDED What changed: See the new instructions.   dronabinol 2.5 MG capsule Commonly known as: Marinol Take 1 capsule (2.5 mg total) by mouth daily before lunch.   Ensure Take 237 mLs by mouth 3 (three) times daily between meals.   esomeprazole 40 MG capsule Commonly known as: NEXIUM Take 40 mg by mouth daily.   ferrous sulfate 325 (65 FE) MG EC tablet Take 1 tablet (325 mg total) by mouth daily with breakfast.   furosemide 20 MG tablet Commonly known as: LASIX Take 1 tablet as needed for leg/feet swelling   Lancet Device Misc 1 each by Does not apply route in the morning. May substitute to any manufacturer covered by patient's insurance.   Lancets Misc. Misc 1 each by Does not apply route every morning. May substitute to any manufacturer covered by patient's insurance.   lidocaine 5 % Commonly known as: LIDODERM USE 1 PATCH EXTERNALLY ONE DAILY REMOVE AND DISCARD WITHIN 12 HOURS.   losartan 25 MG tablet Commonly  known as: COZAAR Take 25 mg by mouth daily.  Melatonin 3 MG Caps Take 1 capsule (3 mg total) by mouth at bedtime. What changed:  when to take this reasons to take this   metoprolol tartrate 25 MG tablet Commonly known as: LOPRESSOR Take 0.5 tablets (12.5 mg total) by mouth 2 (two) times daily.   mirtazapine 7.5 MG tablet Commonly known as: REMERON TAKE 1 TABLET BY MOUTH EVERY NIGHT AT BEDTIME FOR 14 DAYS IF NOT APPETITE IMPROVEMENT INCREASE TO 2 TABLETS AT NIGHT   multivitamin with minerals Tabs tablet Take 1 tablet by mouth daily.   naproxen sodium 220 MG tablet Commonly known as: ALEVE Take 220 mg by mouth 2 (two) times daily as needed (Pain).   ondansetron 4 MG disintegrating tablet Commonly known as: ZOFRAN-ODT Take 4 mg by mouth 2 (two) times daily as needed.   oxyCODONE-acetaminophen 7.5-325 MG tablet Commonly known as: Percocet Take 1 tablet by mouth every 8 (eight) hours as needed for moderate pain.   polyethylene glycol 17 g packet Commonly known as: MIRALAX / GLYCOLAX Take 17 g by mouth daily as needed for mild constipation.   saccharomyces boulardii 250 MG capsule Commonly known as: Florastor Take 1 capsule (250 mg total) by mouth 2 (two) times daily for 10 days.   silver sulfADIAZINE 1 % cream Commonly known as: Silvadene Apply 1 Application topically daily.          Follow-up Information     [facility] Follow up.   Specialty: Home Health Services Why: [phone] Contact information: [address] [place] [address] [phone]         Georganna Skeans, MD. Schedule an appointment as soon as possible for a visit in 1 week(s).   Specialty: Family Medicine Why: post hospitalization follow up Contact information: [address] [employer] [address] [place] [address] [phone]                 TOTAL DISCHARGE TIME: 35 minutes  Mikhala Kenan Rito Ehrlich  [facility] Pager on [email or web address]  01/18/2023, 11:28  AM

## 2023-01-17 NOTE — Progress Notes (Signed)
Pt's blood glucose 44 this morning. Pt was asymptomatic. Hypoglycemic protocol initiated. Breakfast tray was at bedside, but pt refused to eat. D50 held because her IV was occluded. Pt encouraged to take some juices to help with her blood sugar, and pt agreed for some orange juice. Blood glucose rechecked 15 minutes after she finish her juice and was 81. All reports hand off to day shift RN.

## 2023-01-17 NOTE — Plan of Care (Signed)

## 2023-01-17 NOTE — Progress Notes (Signed)
IV team to bedside to obtain IV access; discussed with RN, who states that pt is going home and does not need IV now.

## 2023-01-18 ENCOUNTER — Telehealth: Payer: Self-pay

## 2023-01-18 ENCOUNTER — Inpatient Hospital Stay: Payer: 59

## 2023-01-18 ENCOUNTER — Telehealth: Payer: Self-pay | Admitting: Hematology

## 2023-01-18 NOTE — Transitions of Care (Post Inpatient/ED Visit) (Unsigned)
01/18/2023  Name: Emily [name] MRN: [id number] DOB: June 05, 1940  Today's TOC FU Call Status: Today's TOC FU Call Status:: Unsuccessful Call (1st Attempt) Unsuccessful Call (1st Attempt) Date: 01/18/23  Attempted to reach the patient regarding the most recent Inpatient/ED visit.  Follow Up Plan: Additional outreach attempts will be made to reach the patient to complete the Transitions of Care (Post Inpatient/ED visit) call.   Signature Karena Addison, LPN [facility] Nurse Health Advisor Direct Dial [phone]

## 2023-01-19 ENCOUNTER — Ambulatory Visit: Payer: Self-pay | Admitting: *Deleted

## 2023-01-19 NOTE — Telephone Encounter (Signed)
Agree with disposition.  Will forward to PCP as a FYI.   ______________________________  Jeanene Erb contact number listed in chart and spoke with daughter. She states the [facility] PT was calling the Paramedics to go out to the home and access patient and BP reading. Per daughter she spoke with Mrs. Latella and she sounded fine and has eaten and tolerated diet.    She also recalls that the patient is on 2 BP medications since being d/c from hospital.  Asked daughter if patient has a follow up appt with GI or PCP. She states, she doesn't.   Will call back to schedule appt. To see if able to work patient in on the schedule.

## 2023-01-19 NOTE — Transitions of Care (Post Inpatient/ED Visit) (Unsigned)
01/19/2023  Name: Emily [name] MRN: [id number] DOB: 07/18/1940  Today's TOC FU Call Status: Today's TOC FU Call Status:: Unsuccessful Call (2nd Attempt) Unsuccessful Call (1st Attempt) Date: 01/18/23 Unsuccessful Call (2nd Attempt) Date: 01/19/23  Attempted to reach the patient regarding the most recent Inpatient/ED visit.  Follow Up Plan: Additional outreach attempts will be made to reach the patient to complete the Transitions of Care (Post Inpatient/ED visit) call.   Signature Karena Addison, LPN [facility] Nurse Health Advisor Direct Dial [phone]

## 2023-01-19 NOTE — Telephone Encounter (Signed)
Emily [name], PT called and wanted to give update since speaking to [name], RN earlier. She says EMS came out with a pediatric cuff and her BP was 80/54, BS 151, patient stills refused to be transported to the ED. She has a Stage 2 Pressure Ulcer that was obtained in the hospital, unable to assess at this time. Patient was prescribed Furosemide 20 mg daily and Metoprolol 25 mg 1/2 tab BID, does she need to hold both of those with her BP being that low? She is asking for order for Nursing to come out and assess the patient due to the low BP, no order for Home Health Nursing was received. Emily [name] would like a call back at [phone] with Dr. Tawana Scale recommendations. Also she says patient is wanting a HFU on a Wednesday or Friday afternoon, no available HFU visits, soonest OV is October 4th and patient needs to be seen sooner. Advised I will send this to Dr. Andrey Campanile and someone will call back.

## 2023-01-19 NOTE — Telephone Encounter (Signed)
  Chief Complaint: Abdominal PAin Symptoms: lower abdominal pain, radiates to left rib area 7/10. Dizziness BP 68/38  HR 66 Frequency: 1100 today after eating Pertinent Negatives: Patient denies nausea, vomiting Disposition: [x] ED /[] Urgent Care (no appt availability in office) / [] Appointment(In office/virtual)/ []  [facility]/ [] Home Care/ [] Refused Recommended Disposition /[] [facility]/ []  Follow-up with PCP Additional Notes: Pt released form hospital Sunday, small bowel obstruction.  Gretchen PT with [facility] calling, reports pt\'s BP 68/38  HR 66. States "My reading may not be right, she is so tiny." Advised ED. Pt states she will not go. Gretchen will call EMS for reassessment, possible transport.  Reason for Disposition  Patient sounds very sick or weak to the triager  Answer Assessment - Initial Assessment Questions 1. LOCATION: "Where does it hurt?"      Lower abdomen to left ribs 2. RADIATION: "Does the pain shoot anywhere else?" (e.g., chest, back)     Left rib area 3. ONSET: "When did the pain begin?" (e.g., minutes, hours or days ago)      After eating at 1100 4. SUDDEN: "Gradual or sudden onset?"     NA 5. PATTERN "Does the pain come and go, or is it constant?"    - If it comes and goes: "How long does it last?" "Do you have pain now?"     (Note: Comes and goes means the pain is intermittent. It goes away completely between bouts.)    - If constant: "Is it getting better, staying the same, or getting worse?"      (Note: Constant means the pain never goes away completely; most serious pain is constant and gets worse.)      Constant 6. SEVERITY: "How bad is the pain?"  (e.g., Scale 1-10; mild, moderate, or severe)    - MILD (1-3): Doesn\'t interfere with normal activities, abdomen soft and not tender to touch.     - MODERATE (4-7): Interferes with normal activities or awakens from sleep, abdomen tender to touch.     - SEVERE (8-10):  Excruciating pain, doubled over, unable to do any normal activities.       7/10 7. RECURRENT SYMPTOM: "Have you ever had this type of stomach pain before?" If Yes, ask: "When was the last time?" and "What happened that time?"      Yes SBO       10 . OTHER SYMPTOMS: "Do you have any other symptoms?" (e.g., back pain, diarrhea, fever, urination pain, vomiting)       Dizzy, increased fatigue, BP 68/38 per Vinnie Langton ,PT with [facility]  Protocols used: Abdominal Pain - [facility]

## 2023-01-20 ENCOUNTER — Telehealth: Payer: Self-pay | Admitting: *Deleted

## 2023-01-20 NOTE — Telephone Encounter (Signed)
Emily [name]- Initial evaluation: Fall on stairs- no injury- dizzy and legs gave out- she was not by herself EMT was called yesterday-patient refused ED- today she is weak and Emily [name] is unable to get BP reading- recommend EMT and ED today- he agrees and Emily [name] speak to patient regarding office recommendation.

## 2023-01-20 NOTE — Transitions of Care (Post Inpatient/ED Visit) (Signed)
01/20/2023  Name: Emily [name] MRN: [id number] DOB: 1941/01/10  Today's TOC FU Call Status: Today's TOC FU Call Status:: Unsuccessful Call (3rd Attempt) Unsuccessful Call (1st Attempt) Date: 01/18/23 Unsuccessful Call (2nd Attempt) Date: 01/19/23 Unsuccessful Call (3rd Attempt) Date: 01/20/23  Attempted to reach the patient regarding the most recent Inpatient/ED visit.  Follow Up Plan: No further outreach attempts will be made at this time. We have been unable to contact the patient.  Signature Karena Addison, LPN [facility] Nurse Health Advisor Direct Dial [phone]

## 2023-01-20 NOTE — Telephone Encounter (Signed)
Patient  request early appointment

## 2023-01-21 ENCOUNTER — Telehealth: Payer: Self-pay | Admitting: Family Medicine

## 2023-01-21 NOTE — Telephone Encounter (Signed)
Copied from CRM [id number]. Topic: Quick Communication - Home Health Verbal Orders >> Jan 20, 2023  2:35 PM Everette C wrote: Caller/Agency: [facility]  Callback Number: [phone] Requesting OT/PT/Skilled Nursing/Social Work/Speech Therapy: OT  Frequency: 2w4

## 2023-01-21 NOTE — Telephone Encounter (Signed)
Called patient to get her in for an appointment couldn't reach patient left voicemail to call office back and speak to me directly

## 2023-02-01 ENCOUNTER — Inpatient Hospital Stay: Payer: 59

## 2023-02-01 ENCOUNTER — Ambulatory Visit: Payer: Self-pay

## 2023-02-01 VITALS — BP 104/68 | HR 62 | Temp 98.8°F | Resp 16

## 2023-02-01 DIAGNOSIS — D519 Vitamin B12 deficiency anemia, unspecified: Secondary | ICD-10-CM | POA: Diagnosis present

## 2023-02-01 MED ORDER — CYANOCOBALAMIN 1000 MCG/ML IJ SOLN
1000.0000 ug | Freq: Once | INTRAMUSCULAR | Status: AC
  Administered 2023-02-01: 1000 ug via INTRAMUSCULAR
  Filled 2023-02-01: qty 1

## 2023-02-01 NOTE — Patient Instructions (Signed)
 Vitamin B12 Injection What is this medication? Vitamin B12 (VAHY tuh min B12) prevents and treats low vitamin B12 levels in your body. It is used in people who do not get enough vitamin B12 from their diet or when their digestive tract does not absorb enough. Vitamin B12 plays an important role in maintaining the health of your nervous system and red blood cells. This medicine may be used for other purposes; ask your health care provider or pharmacist if you have questions. COMMON BRAND NAME(S): B-12 Compliance Kit, B-12 Injection Kit, Cyomin, Dodex, LA-12, Nutri-Twelve, Physicians EZ Use B-12, Primabalt, Vitamin Deficiency Injectable System - B12 What should I tell my care team before I take this medication? They need to know if you have any of these conditions: Kidney disease Leber's disease Megaloblastic anemia An unusual or allergic reaction to cyanocobalamin, cobalt, other medications, foods, dyes, or preservatives Pregnant or trying to get pregnant Breast-feeding How should I use this medication? This medication is injected into a muscle or deeply under the skin. It is usually given in a clinic or care team's office. However, your care team may teach you how to inject yourself. Follow all instructions. Talk to your care team about the use of this medication in children. Special care may be needed. Overdosage: If you think you have taken too much of this medicine contact a poison control center or emergency room at once. NOTE: This medicine is only for you. Do not share this medicine with others. What if I miss a dose? If you are given your dose at a clinic or care team's office, call to reschedule your appointment. If you give your own injections, and you miss a dose, take it as soon as you can. If it is almost time for your next dose, take only that dose. Do not take double or extra doses. What may interact with this medication? Alcohol Colchicine This list may not describe all possible  interactions. Give your health care provider a list of all the medicines, herbs, non-prescription drugs, or dietary supplements you use. Also tell them if you smoke, drink alcohol, or use illegal drugs. Some items may interact with your medicine. What should I watch for while using this medication? Visit your care team regularly. You may need blood work done while you are taking this medication. You may need to follow a special diet. Talk to your care team. Limit your alcohol intake and avoid smoking to get the best benefit. What side effects may I notice from receiving this medication? Side effects that you should report to your care team as soon as possible: Allergic reactions--skin rash, itching, hives, swelling of the face, lips, tongue, or throat Swelling of the ankles, hands, or feet Trouble breathing Side effects that usually do not require medical attention (report to your care team if they continue or are bothersome): Diarrhea This list may not describe all possible side effects. Call your doctor for medical advice about side effects. You may report side effects to FDA at 1-800-FDA-1088. Where should I keep my medication? Keep out of the reach of children. Store at room temperature between 15 and 30 degrees C (59 and 85 degrees F). Protect from light. Throw away any unused medication after the expiration date. NOTE: This sheet is a summary. It may not cover all possible information. If you have questions about this medicine, talk to your doctor, pharmacist, or health care provider.  2024 Elsevier/Gold Standard (2020-12-31 00:00:00)

## 2023-02-01 NOTE — Telephone Encounter (Signed)
Summary: stomach pain   Innocent from [facility] called sttd she was with patient on Friday and her pulse was 118 and she was having 8 out of 10 pain in her stomach. Innocent sttd patient did not want her to call the dr but she tried anyway but we were closed. Please f/u with patient         Chief Complaint: Lower abdominal pain Symptoms: Above Frequency: Few weeks, comes and goes Pertinent Negatives: Patient denies  Disposition: [] ED /[] Urgent Care (no appt availability in office) / [x] Appointment(In office/virtual)/ []  [facility]/ [] Home Care/ [] Refused Recommended Disposition /[] [facility]/ []  Follow-up with PCP Additional Notes: Daughter agrees with appointment. Will call back as needed.  Reason for Disposition  Abdominal pain is a chronic symptom (recurrent or ongoing AND present > 4 weeks)  Answer Assessment - Initial Assessment Questions 1. LOCATION: "Where does it hurt?"      Lower 2. RADIATION: "Does the pain shoot anywhere else?" (e.g., chest, back)     No 3. ONSET: "When did the pain begin?" (e.g., minutes, hours or days ago)      Last week 4. SUDDEN: "Gradual or sudden onset?"     Gradual 5. PATTERN "Does the pain come and go, or is it constant?"    - If it comes and goes: "How long does it last?" "Do you have pain now?"     (Note: Comes and goes means the pain is intermittent. It goes away completely between bouts.)    - If constant: "Is it getting better, staying the same, or getting worse?"      (Note: Constant means the pain never goes away completely; most serious pain is constant and gets worse.)      Comes and goes 6. SEVERITY: "How bad is the pain?"  (e.g., Scale 1-10; mild, moderate, or severe)    - MILD (1-3): Doesn't interfere with normal activities, abdomen soft and not tender to touch.     - MODERATE (4-7): Interferes with normal activities or awakens from sleep, abdomen tender to touch.     - SEVERE (8-10): Excruciating  pain, doubled over, unable to do any normal activities.       Moderate 7. RECURRENT SYMPTOM: "Have you ever had this type of stomach pain before?" If Yes, ask: "When was the last time?" and "What happened that time?"      Yes 8. CAUSE: "What do you think is causing the stomach pain?"     Unsure 9. RELIEVING/AGGRAVATING FACTORS: "What makes it better or worse?" (e.g., antacids, bending or twisting motion, bowel movement)     No 10. OTHER SYMPTOMS: "Do you have any other symptoms?" (e.g., back pain, diarrhea, fever, urination pain, vomiting)       No 11. PREGNANCY: "Is there any chance you are pregnant?" "When was your last menstrual period?"       No  Protocols used: Abdominal Pain - [facility]

## 2023-02-03 ENCOUNTER — Ambulatory Visit (INDEPENDENT_AMBULATORY_CARE_PROVIDER_SITE_OTHER): Payer: 59 | Admitting: Family Medicine

## 2023-02-03 ENCOUNTER — Encounter: Payer: Self-pay | Admitting: Family Medicine

## 2023-02-03 VITALS — BP 116/72 | HR 126 | Temp 97.6°F | Resp 16 | Wt <= 1120 oz

## 2023-02-03 DIAGNOSIS — I10 Essential (primary) hypertension: Secondary | ICD-10-CM

## 2023-02-03 DIAGNOSIS — R634 Abnormal weight loss: Secondary | ICD-10-CM | POA: Diagnosis not present

## 2023-02-03 DIAGNOSIS — Z7409 Other reduced mobility: Secondary | ICD-10-CM

## 2023-02-03 DIAGNOSIS — D509 Iron deficiency anemia, unspecified: Secondary | ICD-10-CM

## 2023-02-03 DIAGNOSIS — G894 Chronic pain syndrome: Secondary | ICD-10-CM | POA: Diagnosis not present

## 2023-02-04 ENCOUNTER — Telehealth: Payer: Self-pay | Admitting: Family Medicine

## 2023-02-04 NOTE — Telephone Encounter (Signed)
FYI

## 2023-02-04 NOTE — Telephone Encounter (Signed)
Copied from CRM [id number]. Topic: General - Inquiry >> Feb 03, 2023  4:58 PM Marlow Baars wrote: Reason for CRM: Jillyn Hidden with [facility] called to inform the provider the patient canceled for today and only once to be seen once a week. He said she may reschedule on Friday but he is waiting to hear back from her. Please assist further if necessary

## 2023-02-05 ENCOUNTER — Encounter: Payer: Self-pay | Admitting: Family Medicine
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
## 2023-02-09 ENCOUNTER — Telehealth: Payer: Self-pay | Admitting: Nurse Practitioner

## 2023-02-09 ENCOUNTER — Telehealth: Payer: Self-pay

## 2023-02-09 NOTE — Telephone Encounter (Signed)
Mrs. Munley daughter [name] called (ph [phone]): Patient requesting a refill of Percocet 705-325MG  and Gabapentin.   Filled  Written  ID  Drug  QTY  Days  Prescriber  RX #  Dispenser  Refill  Daily Dose*  Pymt Type  PMP  12/11/2022 12/11/2022 1  Oxycodone-Acetaminophn 7.5-325 90.00 [address] [id number] [facility] ([id number]) 0/0 33.75 MME Medicare [place] 08/28/2022 08/26/2022 1  Oxycodone-Acetaminophn 7.5-325 90.00 [address] [id number] [facility] ([id number]) 0/0 33.75 MME Medicare [place] 08/11/2022 08/11/2022 1  Oxycodone-Acetaminophn 7.5-325 60.00 30 [address] [id number] [facility] ([id number]) 0/0 22.50 MME Medicare [place] 08/11/2022 08/11/2022 1  Gabapentin 300 Mg Capsule 90.00 30 [address] [id number] [facility] [phone]) 0/3  Medicare [place]

## 2023-02-11 ENCOUNTER — Ambulatory Visit: Payer: Self-pay

## 2023-02-11 NOTE — Telephone Encounter (Signed)
Chief Complaint: Ankle swelling Symptoms: bilateral ankle swelling, ankle pain 10/10 Frequency: Constant Pertinent Negatives: Patient denies chest pain, SOB, calf pain Disposition: [] ED /[x] Urgent Care (no appt availability in office) / [] Appointment(In office/virtual)/ []  [facility]/ [] Home Care/ [] Refused Recommended Disposition /[] [facility]/ []  Follow-up with PCP Additional Notes: Erika an OT assistant from [facility] on the call at the patient's home. Cicero Duck reports the patient has bilateral ankle swelling that she would rate +3 pitting edema, and patient is reporting 10/10 ankle pain. Cicero Duck stated vital signs were WNL. Patient states she missed a few doses of her Lasix because she could not find it, due did take it today. Patient denies chest pain, calf pain, and SOB at this time. Care advice was given, no appointments available in the office. Patient stated her daughter would be able to take her to urgent care she just has to call her. Advised if the daughter has any questions to callback. Patient verbalized understanding. Reason for Disposition  [1] SEVERE pain (e.g., excruciating, unable to walk) AND [2] not improved after 2 hours of pain medicine  Answer Assessment - Initial Assessment Questions 1. LOCATION: "Which ankle is swollen?" "Where is the swelling?"     Bilateral ankle 2. ONSET: "When did the swelling start?"     today 3. SWELLING: "How bad is the swelling?" Or, "How large is it?" (e.g., mild, moderate, severe; size of localized swelling)    - NONE: No joint swelling.   - LOCALIZED: Localized; small area of puffy or swollen skin (e.g., insect bite, skin irritation).   - MILD: Joint looks or feels mildly swollen or puffy.   - MODERATE: Swollen; interferes with normal activities (e.g., work or school); decreased range of movement; may be limping.   - SEVERE: Very swollen; can't move swollen joint at all; limping a lot or unable to walk.     Pitting  edema +3 per home health OT  4. PAIN: "Is there any pain?" If Yes, ask: "How bad is it?" (Scale 1-10; or mild, moderate, severe)   - NONE (0): no pain.   - MILD (1-3): doesn't interfere with normal activities.    - MODERATE (4-7): interferes with normal activities (e.g., work or school) or awakens from sleep, limping.    - SEVERE (8-10): excruciating pain, unable to do any normal activities, unable to walk.      10/10 5. CAUSE: "What do you think caused the ankle swelling?"     Has missed a few doses of lasix recently  6. OTHER SYMPTOMS: "Do you have any other symptoms?" (e.g., fever, chest pain, difficulty breathing, calf pain)     Lethargic, ankle pain,  Protocols used: Ankle Swelling-A-AH

## 2023-02-12 NOTE — Telephone Encounter (Signed)
Noted.  Forward to PCP as Juluis Rainier

## 2023-02-15 ENCOUNTER — Inpatient Hospital Stay: Payer: 59 | Admitting: Nurse Practitioner

## 2023-02-15 ENCOUNTER — Other Ambulatory Visit: Payer: Self-pay

## 2023-02-15 ENCOUNTER — Inpatient Hospital Stay: Payer: 59

## 2023-02-15 ENCOUNTER — Telehealth: Payer: Self-pay | Admitting: Registered Nurse

## 2023-02-15 MED ORDER — OXYCODONE-ACETAMINOPHEN 7.5-325 MG PO TABS: 1.0000 | ORAL_TABLET | Freq: Three times a day (TID) | ORAL | 0 refills | Status: AC | PRN

## 2023-02-15 NOTE — Progress Notes (Deleted)
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 She is currently on surveillance. -CU-64 PET on 08/11/21 was negative.  -CT AP on 11/11/21 showed: concerns for pneumonitis; ascending colitis vs nondistention with fluid throughout colon. -she is clinically stable, no clinical concern for recurrence. -She is 6 years out, and her risk of recurrence is lower now  -CT 07/20/2022 showed no evidence of recurrence, dilation of intrahepatic and extrahepatic bile ducts with interval worsening, I recommend GI evaluation     The patient understands the plans discussed today and is in agreement with them.  She knows to contact our office if she develops concerns prior to her next appointment.  I provided *** minutes of face-to-face time during this encounter and > 50% was spent counseling as documented under my assessment and plan.    Carlean Jews, NP  [facility] [address] [place] [address] Dept: [phone] Dept Fax: [phone]   No orders of the defined types were placed in this encounter.     CHIEF COMPLAINT:  CC: primary neuroendocrine tumor of appendix and iron deficiency anemia   Current Treatment:  surveillance. B12 injections every  2 weeks. IV venofer as needed.  Mirtazapine started at most recent visit  INTERVAL HISTORY:  Minsa is here today for repeat clinical assessment. She denies fevers or chills. She denies pain. Her appetite is good. Her weight {Weight change:10426}.  I have reviewed the past medical history, past surgical history, social history and family history with the patient and they are unchanged from previous note.  ALLERGIES:  has No Known Allergies.  MEDICATIONS:  Current Outpatient Medications  Medication Sig Dispense Refill   Accu-Chek Softclix Lancets lancets every morning.     ammonium lactate (LAC-HYDRIN) 12 % cream Apply 1 Application topically as needed for dry skin. (Patient taking differently: Apply 1 Application topically daily as needed for dry skin.) 385 g 0   Blood Glucose Monitoring Suppl (ACCU-CHEK GUIDE ME) w/Device KIT USE TO CHECK BLOOD GLUCOSE IN THE MORNING     Blood Glucose Monitoring Suppl DEVI 1 each by Does not apply route in the morning. May substitute to any manufacturer covered by patient's insurance. 1 each 0   Cyanocobalamin (B-12 COMPLIANCE INJECTION IJ) Inject 1 Dose as directed every 14 (fourteen) days.     dicyclomine (BENTYL) 10 MG capsule TAKE 1 CAPSULE BY MOUTH EVERY 8 HOURS AS NEEDED (Patient taking differently: Take 10 mg by mouth every 8 (eight) hours as needed for spasms.) 90 capsule 0   dronabinol (MARINOL) 2.5 MG capsule Take 1 capsule (2.5 mg total) by mouth daily before lunch. 30 capsule 3   Ensure (ENSURE) Take 237 mLs by mouth 3 (three) times daily between meals.     esomeprazole (NEXIUM) 40 MG capsule Take 40 mg by mouth daily.     ferrous sulfate 325 (65 FE) MG EC tablet Take 1 tablet (325 mg  total) by mouth daily with breakfast. 30 tablet 3   furosemide (LASIX) 20 MG tablet Take 1 tablet as needed for leg/feet swelling 30 tablet 0   Glucose Blood (BLOOD GLUCOSE TEST STRIPS) STRP 1 each by In Vitro route every morning. May substitute to any manufacturer covered by patient's insurance.  100 strip 0   Lancet Device MISC 1 each by Does not apply route in the morning. May substitute to any manufacturer covered by patient's insurance. 1 each 0   Lancets Misc. MISC 1 each by Does not apply route every morning. May substitute to any manufacturer covered by patient's insurance. 100 each 0   lidocaine (LIDODERM) 5 % USE 1 PATCH EXTERNALLY ONE DAILY REMOVE AND DISCARD WITHIN 12 HOURS. 90 patch 0   losartan (COZAAR) 25 MG tablet Take 25 mg by mouth daily.     Melatonin 3 MG CAPS Take 1 capsule (3 mg total) by mouth at bedtime. (Patient taking differently: Take 1 each by mouth at bedtime as needed (for sleep).) 30 capsule 3   metoprolol tartrate (LOPRESSOR) 25 MG tablet Take 0.5 tablets (12.5 mg total) by mouth 2 (two) times daily. 30 tablet 1   mirtazapine (REMERON) 7.5 MG tablet TAKE 1 TABLET BY MOUTH EVERY NIGHT AT BEDTIME FOR 14 DAYS IF NOT APPETITE IMPROVEMENT INCREASE TO 2 TABLETS AT NIGHT 60 tablet 0   Multiple Vitamin (MULTIVITAMIN WITH MINERALS) TABS tablet Take 1 tablet by mouth daily.     naproxen sodium (ALEVE) 220 MG tablet Take 220 mg by mouth 2 (two) times daily as needed (Pain).     ondansetron (ZOFRAN-ODT) 4 MG disintegrating tablet Take 4 mg by mouth 2 (two) times daily as needed.     oxyCODONE-acetaminophen (PERCOCET) 7.5-325 MG tablet Take 1 tablet by mouth every 8 (eight) hours as needed for moderate pain (pain score 4-6). 90 tablet 0   polyethylene glycol (MIRALAX / GLYCOLAX) 17 g packet Take 17 g by mouth daily as needed for mild constipation. 14 each 0   silver sulfADIAZINE (SILVADENE) 1 % cream Apply 1 Application topically daily. 50 g 0   Thiamine HCl (B-1 PO) Take 1 tablet by mouth daily.     No current facility-administered medications for this visit.    HISTORY OF PRESENT ILLNESS:   Oncology History Overview Note  Cancer Staging 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[facility]) Staging form: Appendix - Neuroendocrine Tumors, AJCC 8th Edition -  Pathologic stage from 08/21/2016: Stage Unknown (pT2, pNX, cM0) - Signed by Malachy Mood, MD on 09/18/2016    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HCC)  08/21/2016 Pathology Results   Appendix, Other than Incidental - LOW GRADE NEUROENDOCRINE TUMOR WITH ABUNDANT GOBLET CELLS, 3.5 CM. - TUMOR EXTENDS INTO SUBSEROSAL CONNECTIVE TISSUE. - PROXIMAL MARGIN FREE OF TUMOR.   08/21/2016 Imaging   CT abdomen and pelvis w contrast: Appendix, Other than Incidental - LOW GRADE NEUROENDOCRINE TUMOR WITH ABUNDANT GOBLET CELLS, 3.5 CM. - TUMOR EXTENDS INTO SUBSEROSAL CONNECTIVE TISSUE. - PROXIMAL MARGIN FREE OF TUMOR.   08/21/2016 Surgery   lapscopic appendectomy by Dr. Corliss Skains    08/21/2016 Initial Diagnosis   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HCC)   07/06/2017 Imaging   CT AP W Contrast  IMPRESSION: 1. There are no specific findings identified to suggest metastatic disease within the abdomen or pelvis. 2. Small, nonspecific area of soft tissue at the cecal base adjacent to appendectomy suture line. Findings may represent postsurgical change versus residual tumor. Attention this area  on follow-up imaging is advised. 3. Small hiatal hernia, patulous and dilated distal esophagus. 4. Chronic increase caliber of the common bile duct and pancreatic duct at the level of the pancreatic head. No stone or mass visualized. 5. Nonobstructing right renal calculi 6.  Aortic Atherosclerosis (ICD10-I70.0).   08/31/2018 Imaging   CT AP 08/31/18  IMPRESSION: 1. Status post appendectomy without evidence of recurrent soft tissue or lymphadenopathy. Previously described soft tissue at the cecal base is not appreciated on today's examination. No evidence of metastatic disease in the abdomen or pelvis.   2. Chronic, incidental, and postoperative findings as detailed above.   07/15/2020 Imaging   CT AP  IMPRESSION: 1. Stable exam. No new or progressive interval findings to  suggest recurrent or metastatic disease. 2. Large colonic stool volume. Imaging features would be compatible with constipation in the appropriate clinical setting. 3. Similar appearance of intra and extrahepatic biliary duct dilatation status post cholecystectomy. Findings likely reflect sequelae of prior surgery. Correlation with liver function tests may prove helpful. 4. Right renal cysts. 5. Aortic Atherosclerosis (ICD10-I70.0).   05/12/2021 Imaging   EXAM: CT CHEST, ABDOMEN AND PELVIS WITHOUT CONTRAST  IMPRESSION: 1. Fullness in the region of the pancreatic head and proximal body, poorly evaluated on today's noncontrast CT examination. The possibility of pancreatic neoplasm should be considered given the patient's history of unexplained weight loss. This could be better evaluated with follow-up abdominal MRI with and without IV gadolinium with MRCP if there is clinical concern for neoplasm. 2. No other findings in the chest, abdomen or pelvis to account for the patient's unexplained weight loss. 3. Subtle changes in the lungs concerning for developing interstitial lung disease. Outpatient referral to Pulmonology for further clinical evaluation is recommended. Follow-up nonemergent high-resolution chest CT could also be considered to better evaluate these findings, preferably in 3-6 months to allow for temporal changes in the appearance of the lung parenchyma. 4. Pulmonary nodules are stable compared to remote prior examinations, considered definitively benign, as above. 5. Aortic atherosclerosis, in addition to three-vessel coronary artery disease. 6. Additional incidental findings, as above.   05/26/2021 Imaging   EXAM: MRI ABDOMEN WITHOUT AND WITH CONTRAST  IMPRESSION: 1. No pancreatic mass visualized. Segment of dilated pancreatic duct at the head of the pancreas measuring up to 7 mm in diameter with normal caliber of the duct throughout the body and tail. 2. Mild biliary  ductal dilatation which may be compensatory from cholecystectomy, or possibly secondary to ampullary stenosis, correlate with bilirubin. 3. Distended and fluid-filled distal esophagus. Correlate for possible reflux. 4. Atelectasis or infiltrates in the lung bases left-greater-than-right. 5. Renal cysts.       REVIEW OF SYSTEMS:   Constitutional: Denies fevers, chills or abnormal weight loss Eyes: Denies blurriness of vision Ears, nose, mouth, throat, and face: Denies mucositis or sore throat Respiratory: Denies cough, dyspnea or wheezes Cardiovascular: Denies palpitation, chest discomfort or lower extremity swelling Gastrointestinal:  Denies nausea, heartburn or change in bowel habits Skin: Denies abnormal skin rashes Lymphatics: Denies new lymphadenopathy or easy bruising Neurological:Denies numbness, tingling or new weaknesses Behavioral/Psych: Mood is stable, no new changes  All other systems were reviewed with the patient and are negative.   VITALS:  There were no vitals taken for this visit.  Wt Readings from Last 3 Encounters:  02/03/23 69 lb (31.3 kg)  01/14/23 82 lb 3.7 oz (37.3 kg)  01/06/23 71 lb (32.2 kg)    There is no height or weight on file to  calculate BMI.  Performance status (ECOG): {CHL ONC [id number]  PHYSICAL EXAM:   GENERAL:alert, no distress and comfortable SKIN: skin color, texture, turgor are normal, no rashes or significant lesions EYES: normal, Conjunctiva are pink and non-injected, sclera clear OROPHARYNX:no exudate, no erythema and lips, buccal mucosa, and tongue normal  NECK: supple, thyroid normal size, non-tender, without nodularity LYMPH:  no palpable lymphadenopathy in the cervical, axillary or inguinal LUNGS: clear to auscultation and percussion with normal breathing effort HEART: regular rate & rhythm and no murmurs and no lower extremity edema ABDOMEN:abdomen soft, non-tender and normal bowel sounds Musculoskeletal:no cyanosis  of digits and no clubbing  NEURO: alert & oriented x 3 with fluent speech, no focal motor/sensory deficits  LABORATORY DATA:  I have reviewed the data as listed    Component Value Date/Time   NA 133 (L) 01/17/2023 0235   NA 140 12/11/2016 1117   K 5.0 01/17/2023 0235   K 4.5 12/11/2016 1117   CL 113 (H) 01/17/2023 0235   CO2 14 (L) 01/17/2023 0235   CO2 23 12/11/2016 1117   GLUCOSE 82 01/17/2023 0235   GLUCOSE 90 12/11/2016 1117   BUN 6 (L) 01/17/2023 0235   BUN 21.9 12/11/2016 1117   CREATININE 0.79 01/17/2023 0235   CREATININE 1.16 (H) 11/23/2022 1339   CREATININE 0.9 12/11/2016 1117   CALCIUM 7.3 (L) 01/17/2023 0235   CALCIUM 9.0 12/11/2016 1117   PROT 5.2 (L) 01/12/2023 2153   PROT 7.0 12/11/2016 1117   ALBUMIN 2.0 (L) 01/12/2023 2153   ALBUMIN 3.8 12/11/2016 1117   AST 25 01/12/2023 2153   AST 60 (H) 11/23/2022 1339   AST 29 12/11/2016 1117   ALT 15 01/12/2023 2153   ALT 57 (H) 11/23/2022 1339   ALT 44 12/11/2016 1117   ALKPHOS 82 01/12/2023 2153   ALKPHOS 94 12/11/2016 1117   BILITOT 0.8 01/12/2023 2153   BILITOT 0.4 11/23/2022 1339   BILITOT 0.35 12/11/2016 1117   GFRNONAA >60 01/17/2023 0235   GFRNONAA 47 (L) 11/23/2022 1339   GFRAA >60 12/14/2019 1219   GFRAA >60 06/08/2019 1124    No results found for: "SPEP", "UPEP"  Lab Results  Component Value Date   WBC 5.3 01/17/2023   NEUTROABS 6.9 01/12/2023   HGB 10.0 (L) 01/17/2023   HCT 31.8 (L) 01/17/2023   MCV 75.4 (L) 01/17/2023   PLT 153 01/17/2023      Chemistry      Component Value Date/Time   NA 133 (L) 01/17/2023 0235   NA 140 12/11/2016 1117   K 5.0 01/17/2023 0235   K 4.5 12/11/2016 1117   CL 113 (H) 01/17/2023 0235   CO2 14 (L) 01/17/2023 0235   CO2 23 12/11/2016 1117   BUN 6 (L) 01/17/2023 0235   BUN 21.9 12/11/2016 1117   CREATININE 0.79 01/17/2023 0235   CREATININE 1.16 (H) 11/23/2022 1339   CREATININE 0.9 12/11/2016 1117      Component Value Date/Time   CALCIUM 7.3 (L)  01/17/2023 0235   CALCIUM 9.0 12/11/2016 1117   ALKPHOS 82 01/12/2023 2153   ALKPHOS 94 12/11/2016 1117   AST 25 01/12/2023 2153   AST 60 (H) 11/23/2022 1339   AST 29 12/11/2016 1117   ALT 15 01/12/2023 2153   ALT 57 (H) 11/23/2022 1339   ALT 44 12/11/2016 1117   BILITOT 0.8 01/12/2023 2153   BILITOT 0.4 11/23/2022 1339   BILITOT 0.35 12/11/2016 1117       RADIOGRAPHIC STUDIES: I  have personally reviewed the radiological images as listed and agreed with the findings in the report. No results found.

## 2023-02-15 NOTE — Telephone Encounter (Signed)
Hospital discharge summary

## 2023-02-15 NOTE — Assessment & Plan Note (Deleted)
pT2NxM0 -diagnosed in 08/2016, s/p appendectomy; this was felt to be sufficient given her age, comorbidities, and prior abdominal surgeries. She is currently on surveillance. -CU-64 PET on 08/11/21 was negative.  -CT AP on 11/11/21 showed: concerns for pneumonitis; ascending colitis vs nondistention with fluid throughout colon. -she is clinically stable, no clinical concern for recurrence. -She is 6 years out, and her risk of recurrence is lower now  -CT 07/20/2022 showed no evidence of recurrence, dilation of intrahepatic and extrahepatic bile ducts with interval worsening, I recommend GI evaluation

## 2023-02-15 NOTE — Telephone Encounter (Signed)
PMP was Reviewed.  Call placed to Emily [name] daughter  Last Gabapentin was filled in April 2024 Hospital Discharge note:  discontinued Gabapentin in 10/2022

## 2023-02-15 NOTE — Telephone Encounter (Signed)
Spoke with her daughter  Oxycodone was filled today. Daughter reports she is dispensing her oxycodone 05 - 1 tablet three times a day as needed for pain.  Gabapentin was discontinued during Hospital stay in June, according to PMP last fill date was in April. Daughter reports she gives her the Gabapentin sparingly, Dr Marijean Niemann how would you like to proceed with the Gabapentin.

## 2023-02-16 ENCOUNTER — Other Ambulatory Visit: Payer: 59 | Attending: Hematology

## 2023-02-16 ENCOUNTER — Inpatient Hospital Stay: Payer: 59 | Admitting: Nurse Practitioner

## 2023-02-16 ENCOUNTER — Inpatient Hospital Stay: Payer: 59

## 2023-02-16 DIAGNOSIS — C7A8 Other malignant neuroendocrine tumors: Secondary | ICD-10-CM

## 2023-02-16 MED ORDER — GABAPENTIN 300 MG PO CAPS: 300.0000 mg | ORAL_CAPSULE | Freq: Every day | ORAL | 1 refills | Status: AC | PRN

## 2023-02-16 NOTE — Telephone Encounter (Signed)
Dr Carlis Abbott responded to phone message and agrees to prescribed Gabapentin.  Her daughter is dispensing her medication. This provider called Ms. Garson daughter regarding the above, she states Ms. Akard is tolerating the Gabapentin without any adverse reaction she is dispensing the Gabapentin sparingly.Gabapentin prescription sent to the pharmacy, she verbalizes understanding.

## 2023-02-17 ENCOUNTER — Encounter: Payer: Self-pay | Admitting: Neurology

## 2023-02-17 ENCOUNTER — Ambulatory Visit: Payer: 59 | Admitting: Neurology

## 2023-02-18 ENCOUNTER — Telehealth: Payer: Self-pay | Admitting: Family Medicine

## 2023-02-18 NOTE — Telephone Encounter (Signed)
Home Health Verbal Orders - Caller/Agency: [facility]  Callback Number: [phone]  Requesting OT/PT/Skilled Nursing/Social Work/Speech Therapy: Order for Palliative Services with [facility]

## 2023-02-18 NOTE — Telephone Encounter (Signed)
Form has been sent to provider for signature

## 2023-02-23 ENCOUNTER — Encounter: Payer: Self-pay | Admitting: Hematology

## 2023-02-28 IMAGING — RF DG ESOPHAGUS
14 series · 14 of 24 positions shown · non-contrast
Comparison: Chest CT 05/12/2021

CLINICAL DATA: Dysphagia

EXAM:
ESOPHOGRAM / BARIUM SWALLOW / BARIUM TABLET STUDY
TECHNIQUE: Combined double contrast and single contrast examination performed
using effervescent crystals, thick barium liquid, and thin barium
liquid. The patient was observed with fluoroscopy swallowing a 13 mm
barium sulphate tablet.
FLUOROSCOPY:
Radiation Exposure Index (as provided by the fluoroscopic device):
26.4 mGy Kerma

[Series 1: cp_standard · 1 of 151 frames shown (1 of 14)]
[frame 1/151]
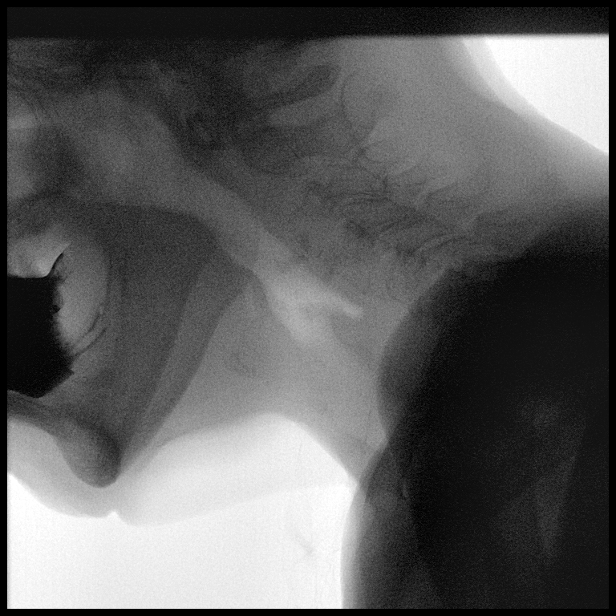

[Series 2: cp_standard · 1 of 228 frames shown (2 of 14)]
[frame 92/228]
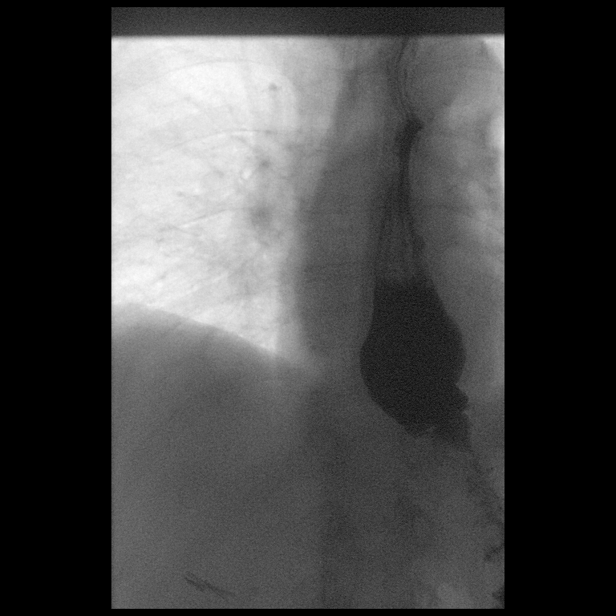

[Series 3: cp_standard · 1 of 153 frames shown (3 of 14)]
[frame 131/153]
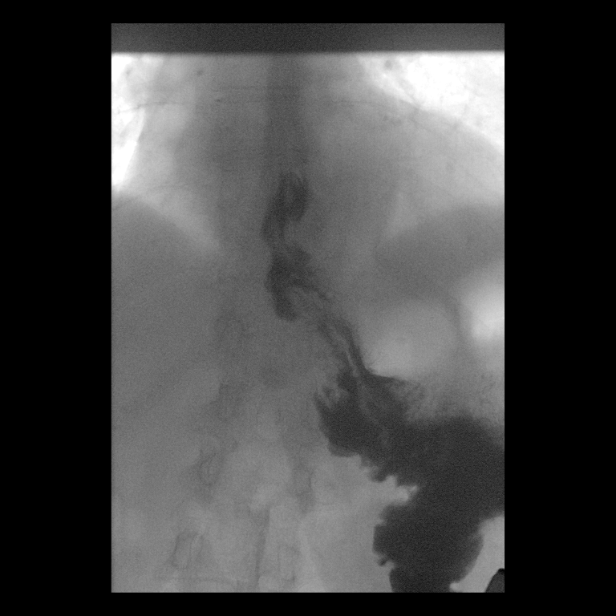

[Series 4: cp_standard · 1 of 152 frames shown (4 of 14)]
[frame 105/152]
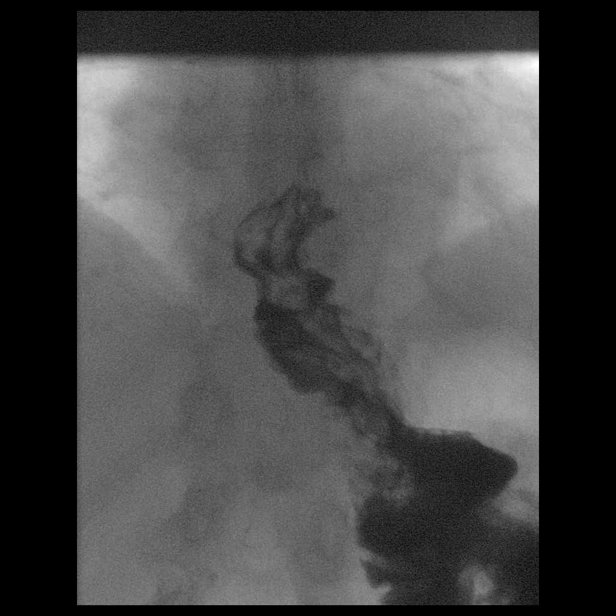

[Series 5: cp_standard · 1 of 169 frames shown (5 of 14)]
[frame 53/169]
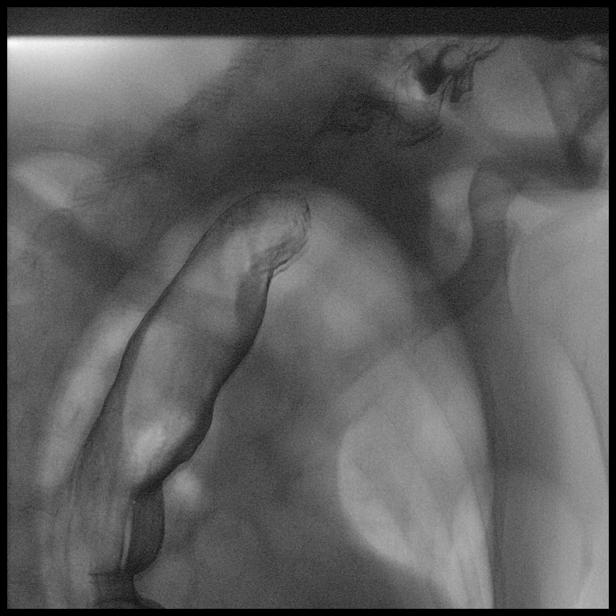

[Series 6: cp_standard · 1 of 132 frames shown (6 of 14)]
[frame 67/132]
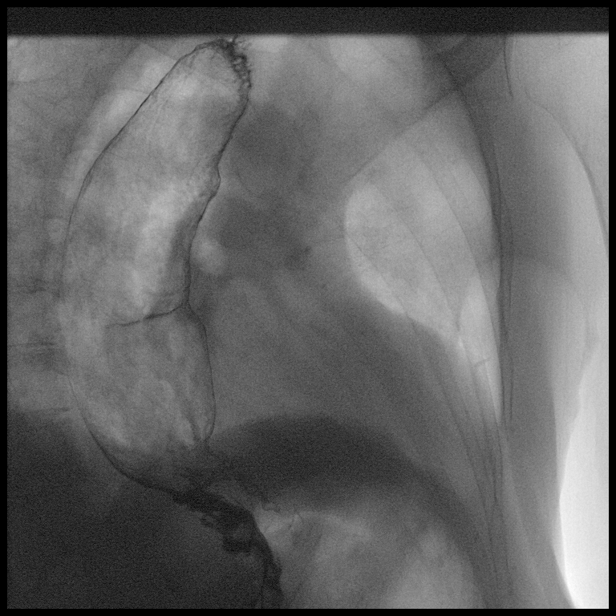

[Series 7: cp_standard · 1 of 212 frames shown (7 of 14)]
[frame 107/212]
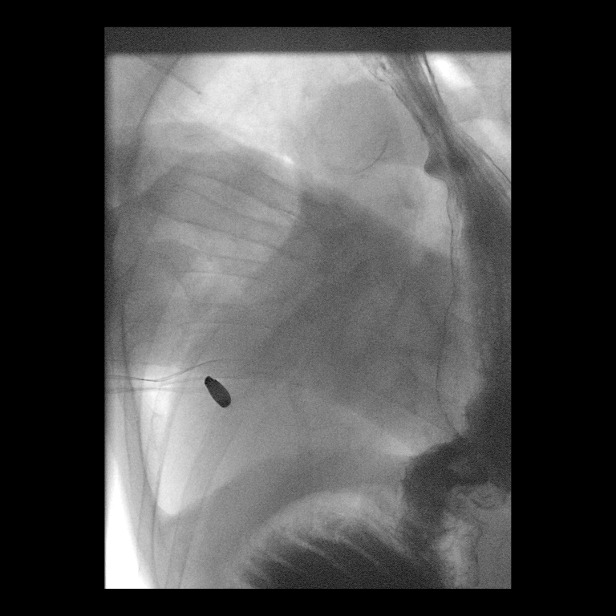

[Series 8: cp_standard · 1 of 282 frames shown (8 of 14)]
[frame 97/282]
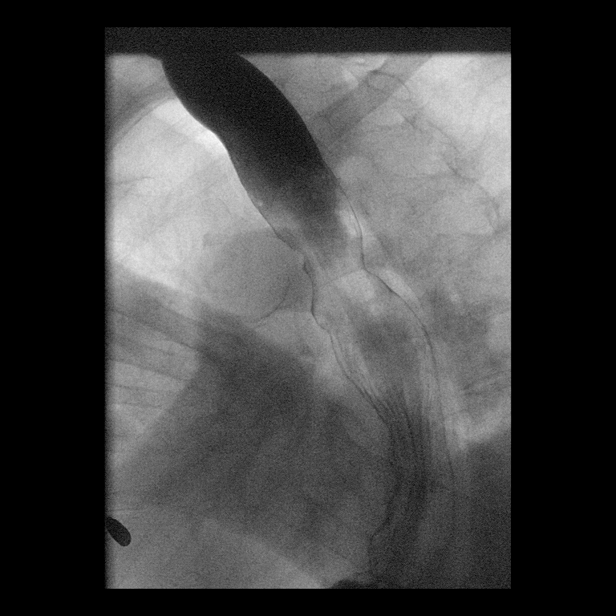

[Series 9: cp_standard · 1 of 219 frames shown (9 of 14)]
[frame 110/219]
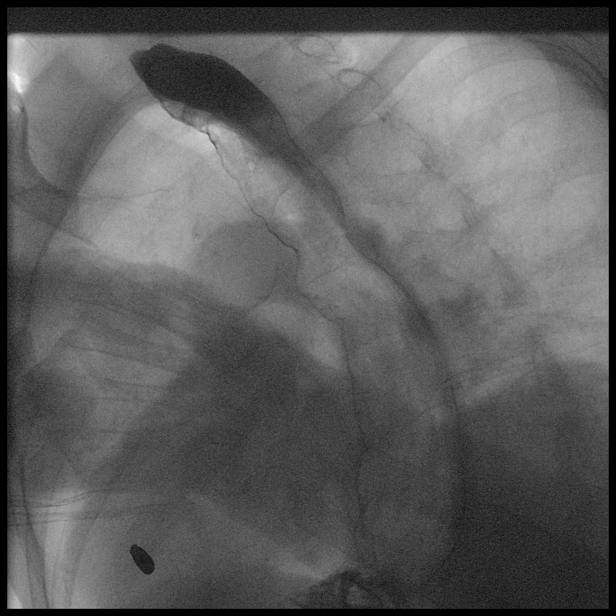

[Series 10: cp_standard · 1 of 228 frames shown (10 of 14)]
[frame 119/228]
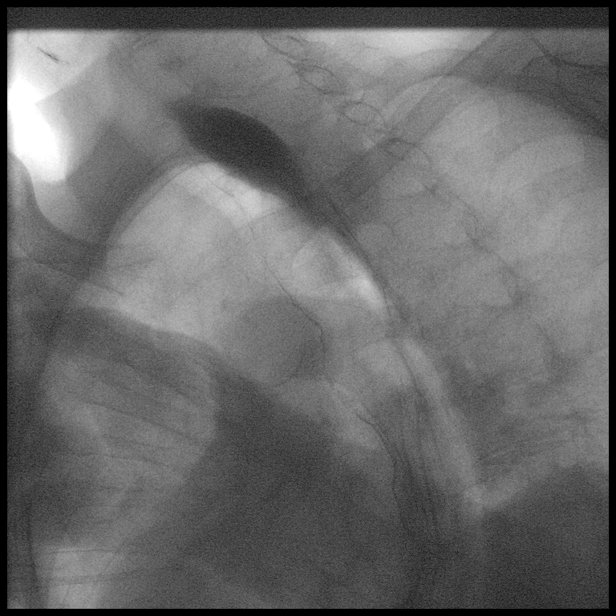

[Series 11: cp_standard · 1 of 167 frames shown (11 of 14)]
[frame 142/167]
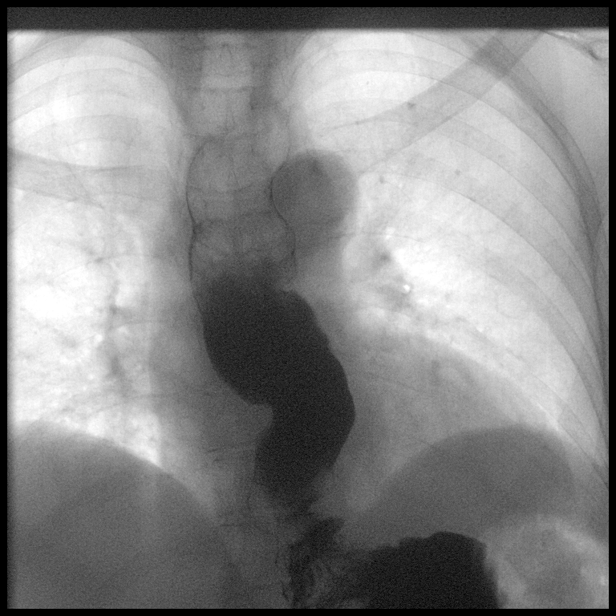

[Series 12: cp_standard · 1 of 149 frames shown (12 of 14)]
[frame 75/149]
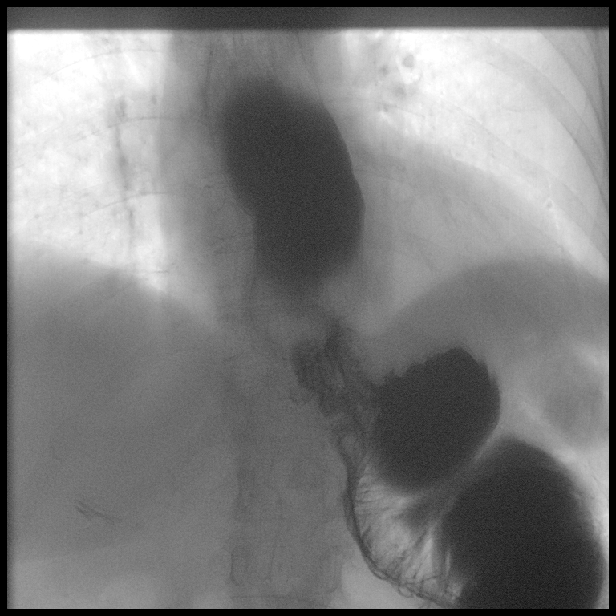

[Series 13: cp_standard · 1 of 231 frames shown (13 of 14)]
[frame 116/231]
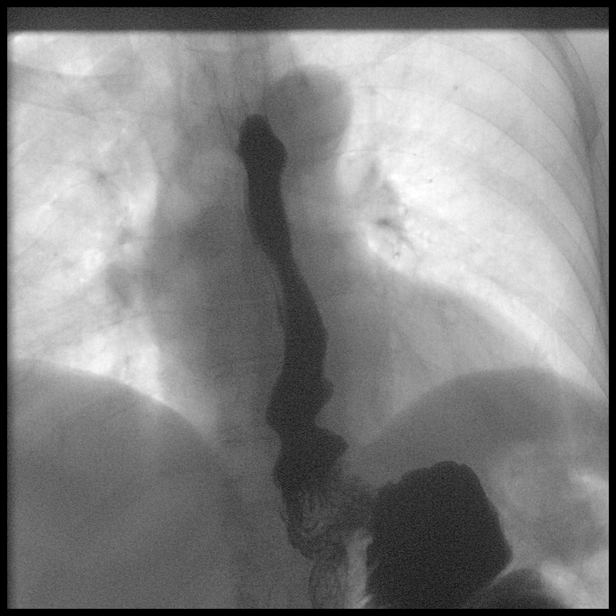

[Series 14: cp_standard · 1 of 99 frames shown (14 of 14)]
[frame 85/99]
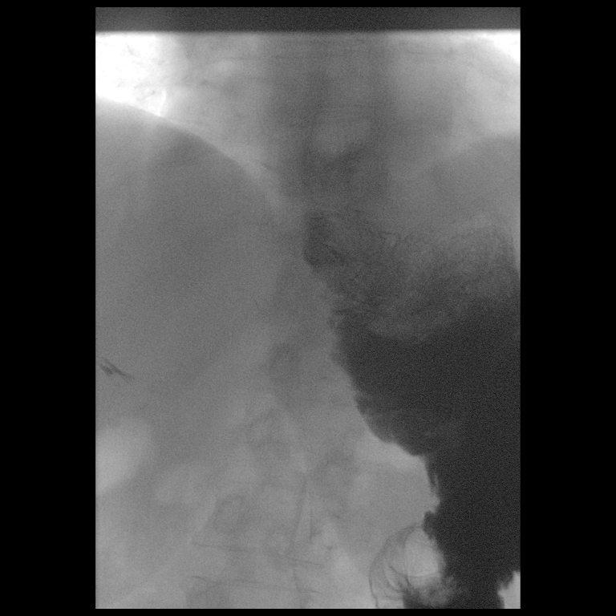

[14 of 24 positions shown; findings below may reference images not displayed]

FINDINGS: Limited lateral cervical esophagram demonstrates no evidence of
laryngeal penetration or intratracheal aspiration.

Diffusely dilated esophagus with severe esophageal dysmotility. No
obvious mucosal abnormality. No evidence of esophageal stricture or
ulceration. A 13 mm barium tablet passes without difficulty into the
stomach.

There is a small size hiatal hernia. There is severe spontaneous
gastroesophageal reflux to the level of the thoracic inlet.
IMPRESSION: Diffusely dilated esophagus with severe dysmotility. No evidence of
stricture or ulceration.

Small hiatal hernia. Severe spontaneous gastroesophageal reflux to
the level of the thoracic inlet.

## 2023-03-04 ENCOUNTER — Ambulatory Visit: Payer: 59

## 2023-03-04 DIAGNOSIS — Z0000 Encounter for general adult medical examination without abnormal findings: Secondary | ICD-10-CM | POA: Diagnosis not present
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
## 2023-03-04 NOTE — Patient Instructions (Signed)
Ms. Krupicka , Thank you for taking time to come for your Medicare Wellness Visit. I appreciate your ongoing commitment to your health goals. Please review the following plan we discussed and let me know if I can assist you in the future.   Referrals/Orders/Follow-Ups/Clinician Recommendations: none  Managing Pain Without Opioids Opioids are strong medicines used to treat moderate to severe pain. For some people, especially those who have long-term (chronic) pain, opioids may not be the best choice for pain management due to: Side effects like nausea, constipation, and sleepiness. The risk of addiction (opioid use disorder). The longer you take opioids, the greater your risk of addiction. Pain that lasts for more than 3 months is called chronic pain. Managing chronic pain usually requires more than one approach and is often provided by a team of health care providers working together (multidisciplinary approach). Pain management may be done at a pain management center or pain clinic. How to manage pain without the use of opioids Use non-opioid medicines Non-opioid medicines for pain may include: Over-the-counter or prescription non-steroidal anti-inflammatory drugs (NSAIDs). These may be the first medicines used for pain. They work well for muscle and bone pain, and they reduce swelling. Acetaminophen. This over-the-counter medicine may work well for milder pain but not swelling. Antidepressants. These may be used to treat chronic pain. A certain type of antidepressant (tricyclics) is often used. These medicines are given in lower doses for pain than when used for depression. Anticonvulsants. These are usually used to treat seizures but may also reduce nerve (neuropathic) pain. Muscle relaxants. These relieve pain caused by sudden muscle tightening (spasms). You may also use a pain medicine that is applied to the skin as a patch, cream, or gel (topical analgesic), such as a numbing medicine. These  may cause fewer side effects than medicines taken by mouth. Do certain therapies as directed Some therapies can help with pain management. They include: Physical therapy. You will do exercises to gain strength and flexibility. A physical therapist may teach you exercises to move and stretch parts of your body that are weak, stiff, or painful. You can learn these exercises at physical therapy visits and practice them at home. Physical therapy may also involve: Massage. Heat wraps or applying heat or cold to affected areas. Electrical signals that interrupt pain signals (transcutaneous electrical nerve stimulation, TENS). Weak lasers that reduce pain and swelling (low-level laser therapy). Signals from your body that help you learn to regulate pain (biofeedback). Occupational therapy. This helps you to learn ways to function at home and work with less pain. Recreational therapy. This involves trying new activities or hobbies, such as a physical activity or drawing. Mental health therapy, including: Cognitive behavioral therapy (CBT). This helps you learn coping skills for dealing with pain. Acceptance and commitment therapy (ACT) to change the way you think and react to pain. Relaxation therapies, including muscle relaxation exercises and mindfulness-based stress reduction. Pain management counseling. This may be individual, family, or group counseling.  Receive medical treatments Medical treatments for pain management include: Nerve block injections. These may include a pain blocker and anti-inflammatory medicines. You may have injections: Near the spine to relieve chronic back or neck pain. Into joints to relieve back or joint pain. Into nerve areas that supply a painful area to relieve body pain. Into muscles (trigger point injections) to relieve some painful muscle conditions. A medical device placed near your spine to help block pain signals and relieve nerve pain or chronic back pain  (spinal  cord stimulation device). Acupuncture. Follow these instructions at home Medicines Take over-the-counter and prescription medicines only as told by your health care provider. If you are taking pain medicine, ask your health care providers about possible side effects to watch out for. Do not drive or use heavy machinery while taking prescription opioid pain medicine. Lifestyle  Do not use drugs or alcohol to reduce pain. If you drink alcohol, limit how much you have to: 0-1 drink a day for women who are not pregnant. 0-2 drinks a day for men. Know how much alcohol is in a drink. In the [place] one drink equals one 12 oz bottle of beer (355 mL), one 5 oz glass of wine (148 mL), or one 1 oz glass of hard liquor (44 mL). Do not use any products that contain nicotine or tobacco. These products include cigarettes, chewing tobacco, and vaping devices, such as e-cigarettes. If you need help quitting, ask your health care provider. Eat a healthy diet and maintain a healthy weight. Poor diet and excess weight may make pain worse. Eat foods that are high in fiber. These include fresh fruits and vegetables, whole grains, and beans. Limit foods that are high in fat and processed sugars, such as fried and sweet foods. Exercise regularly. Exercise lowers stress and may help relieve pain. Ask your health care provider what activities and exercises are safe for you. If your health care provider approves, join an exercise class that combines movement and stress reduction. Examples include yoga and tai chi. Get enough sleep. Lack of sleep may make pain worse. Lower stress as much as possible. Practice stress reduction techniques as told by your therapist. General instructions Work with all your pain management providers to find the treatments that work best for you. You are an important member of your pain management team. There are many things you can do to reduce pain on your own. Consider joining an  online or in-person support group for people who have chronic pain. Keep all follow-up visits. This is important. Where to find more information You can find more information about managing pain without opioids from: American Academy of Pain Medicine: painmed.org Institute for Chronic Pain: instituteforchronicpain.org American Chronic Pain Association: theacpa.org Contact a health care provider if: You have side effects from pain medicine. Your pain gets worse or does not get better with treatments or home therapy. You are struggling with anxiety or depression. Summary Many types of pain can be managed without opioids. Chronic pain may respond better to pain management without opioids. Pain is best managed when you and a team of health care providers work together. Pain management without opioids may include non-opioid medicines, medical treatments, physical therapy, mental health therapy, and lifestyle changes. Tell your health care providers if your pain gets worse or is not being managed well enough. This information is not intended to replace advice given to you by your health care provider. Make sure you discuss any questions you have with your health care provider. Document Revised: 07/31/2020 Document Reviewed: 07/31/2020 Elsevier Patient Education  2024 Elsevier Inc.   This is a list of the screening recommended for you and due dates:  Health Maintenance  Topic Date Due   Flu Shot  12/03/2022   Pneumonia Vaccine (1 of 2 - PCV) 05/04/2023*   Complete foot exam   05/04/2023*   Eye exam for diabetics  05/04/2023*   Colon Cancer Screening  05/04/2023*   Yearly kidney health urinalysis for diabetes  05/05/2023*   Hemoglobin  A1C  07/13/2023   Yearly kidney function blood test for diabetes  01/17/2024   Medicare Annual Wellness Visit  03/03/2024   DEXA scan (bone density measurement)  Completed   HPV Vaccine  Aged Out   DTaP/Tdap/Td vaccine  Discontinued   COVID-19 Vaccine   Discontinued   Zoster (Shingles) Vaccine  Discontinued  *Topic was postponed. The date shown is not the original due date.    Advanced directives: (ACP Link)Information on Advanced Care Planning can be found at [facility] Advance Health Care Directives Advance Health Care Directives ([email or web address]   Next Medicare Annual Wellness Visit scheduled for next year: No. Schedule is not open for next year  Insert Preventive Care attachment Insert FALL PREVENTION attachment if needed

## 2023-03-11 ENCOUNTER — Telehealth: Payer: Self-pay | Admitting: Family Medicine

## 2023-03-11 NOTE — Telephone Encounter (Addendum)
Sherri with [facility] has called to let Dr Andrey Campanile know that they received the pallative care referral and they will follow the patient.  Sherri's callback # [phone]

## 2023-03-16 ENCOUNTER — Telehealth: Payer: Self-pay | Admitting: Family Medicine
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
## 2023-03-17 ENCOUNTER — Encounter: Payer: 59 | Attending: Registered Nurse | Admitting: Registered Nurse

## 2023-03-17 DIAGNOSIS — G894 Chronic pain syndrome: Secondary | ICD-10-CM | POA: Insufficient documentation

## 2023-03-17 DIAGNOSIS — G8929 Other chronic pain: Secondary | ICD-10-CM | POA: Insufficient documentation

## 2023-03-17 DIAGNOSIS — M545 Low back pain, unspecified: Secondary | ICD-10-CM | POA: Insufficient documentation

## 2023-03-17 DIAGNOSIS — R1084 Generalized abdominal pain: Secondary | ICD-10-CM | POA: Insufficient documentation

## 2023-03-17 DIAGNOSIS — Z5181 Encounter for therapeutic drug level monitoring: Secondary | ICD-10-CM | POA: Insufficient documentation

## 2023-03-17 DIAGNOSIS — Z79891 Long term (current) use of opiate analgesic: Secondary | ICD-10-CM | POA: Insufficient documentation

## 2023-03-22 ENCOUNTER — Telehealth: Payer: Self-pay | Admitting: Family Medicine

## 2023-03-22 NOTE — Telephone Encounter (Signed)
CJ from [facility] called in wanting to know if Dr Andrey Campanile will follow pt for symptoms or hospice Dr and also needs referral to hospice. Fx # is [phone]

## 2023-03-23 ENCOUNTER — Other Ambulatory Visit: Payer: Self-pay | Admitting: *Deleted

## 2023-03-23 NOTE — Telephone Encounter (Signed)
Dr. Andrey Campanile  I spoke to [facility] and they said patent is declining and would need a referral for hospice. Are you able to do this referral?

## 2023-03-23 NOTE — Telephone Encounter (Signed)
Verbal Order given nurse star at [facility] care

## 2023-03-23 NOTE — Telephone Encounter (Signed)
CJ has been called. This paper was sent over to [facility] care on  03/11/2023

## 2023-03-23 NOTE — Telephone Encounter (Signed)
Belenda Cruise from [employer], says has received paperwork for Pallative care but not for hospice

## 2023-03-24 ENCOUNTER — Ambulatory Visit: Payer: 59 | Admitting: Podiatry

## 2023-04-07 ENCOUNTER — Ambulatory Visit: Payer: 59 | Admitting: Family Medicine

## 2023-07-03 DEATH — deceased

## 2024-01-06 ENCOUNTER — Encounter: Payer: Self-pay | Admitting: Hematology
